# Patient Record
Sex: Male | Born: 1950 | Race: White | Hispanic: No | Marital: Married | Smoking: Former smoker
Health system: Southern US, Community
[De-identification: ages and names within clinical notes are randomized; demographics above are authoritative.]

## PROBLEM LIST (undated history)

## (undated) DIAGNOSIS — Z87891 Personal history of nicotine dependence: Secondary | ICD-10-CM

## (undated) DIAGNOSIS — R945 Abnormal results of liver function studies: Secondary | ICD-10-CM

## (undated) DIAGNOSIS — K219 Gastro-esophageal reflux disease without esophagitis: Secondary | ICD-10-CM

## (undated) DIAGNOSIS — J449 Chronic obstructive pulmonary disease, unspecified: Secondary | ICD-10-CM

## (undated) DIAGNOSIS — G5620 Lesion of ulnar nerve, unspecified upper limb: Secondary | ICD-10-CM

## (undated) DIAGNOSIS — I4891 Unspecified atrial fibrillation: Secondary | ICD-10-CM

## (undated) DIAGNOSIS — L02212 Cutaneous abscess of back [any part, except buttock]: Secondary | ICD-10-CM

## (undated) DIAGNOSIS — F528 Other sexual dysfunction not due to a substance or known physiological condition: Secondary | ICD-10-CM

## (undated) DIAGNOSIS — R7309 Other abnormal glucose: Secondary | ICD-10-CM

## (undated) DIAGNOSIS — R0600 Dyspnea, unspecified: Secondary | ICD-10-CM

## (undated) DIAGNOSIS — E119 Type 2 diabetes mellitus without complications: Secondary | ICD-10-CM

## (undated) DIAGNOSIS — M7710 Lateral epicondylitis, unspecified elbow: Secondary | ICD-10-CM

## (undated) DIAGNOSIS — I10 Essential (primary) hypertension: Secondary | ICD-10-CM

## (undated) DIAGNOSIS — R200 Anesthesia of skin: Secondary | ICD-10-CM

## (undated) DIAGNOSIS — E785 Hyperlipidemia, unspecified: Secondary | ICD-10-CM

## (undated) DIAGNOSIS — I219 Acute myocardial infarction, unspecified: Secondary | ICD-10-CM

## (undated) DIAGNOSIS — I2510 Atherosclerotic heart disease of native coronary artery without angina pectoris: Secondary | ICD-10-CM

## (undated) DIAGNOSIS — T881XXA Other complications following immunization, not elsewhere classified, initial encounter: Secondary | ICD-10-CM

## (undated) DIAGNOSIS — E669 Obesity, unspecified: Secondary | ICD-10-CM

## (undated) DIAGNOSIS — H35369 Drusen (degenerative) of macula, unspecified eye: Secondary | ICD-10-CM

## (undated) DIAGNOSIS — I499 Cardiac arrhythmia, unspecified: Secondary | ICD-10-CM

## (undated) DIAGNOSIS — F419 Anxiety disorder, unspecified: Secondary | ICD-10-CM

## (undated) DIAGNOSIS — K37 Unspecified appendicitis: Secondary | ICD-10-CM

## (undated) DIAGNOSIS — K3532 Acute appendicitis with perforation and localized peritonitis, without abscess: Secondary | ICD-10-CM

## (undated) DIAGNOSIS — J0510 Acute epiglottitis without obstruction: Secondary | ICD-10-CM

## (undated) DIAGNOSIS — G4733 Obstructive sleep apnea (adult) (pediatric): Secondary | ICD-10-CM

## (undated) DIAGNOSIS — R011 Cardiac murmur, unspecified: Secondary | ICD-10-CM

## (undated) DIAGNOSIS — K429 Umbilical hernia without obstruction or gangrene: Secondary | ICD-10-CM

## (undated) DIAGNOSIS — D696 Thrombocytopenia, unspecified: Secondary | ICD-10-CM

## (undated) DIAGNOSIS — N281 Cyst of kidney, acquired: Secondary | ICD-10-CM

## (undated) DIAGNOSIS — I723 Aneurysm of iliac artery: Secondary | ICD-10-CM

## (undated) HISTORY — DX: Aneurysm of iliac artery: I72.3

## (undated) HISTORY — DX: Lateral epicondylitis, unspecified elbow: M77.10

## (undated) HISTORY — DX: Other complications following immunization, not elsewhere classified, initial encounter: T88.1XXA

## (undated) HISTORY — DX: Cyst of kidney, acquired: N28.1

## (undated) HISTORY — PX: LUMBAR DISC SURGERY: SHX700

## (undated) HISTORY — DX: Acute epiglottitis without obstruction: J05.10

## (undated) HISTORY — DX: Unspecified appendicitis: K37

## (undated) HISTORY — DX: Obstructive sleep apnea (adult) (pediatric): G47.33

## (undated) HISTORY — DX: Hyperlipidemia, unspecified: E78.5

## (undated) HISTORY — DX: Other sexual dysfunction not due to a substance or known physiological condition: F52.8

## (undated) HISTORY — DX: Abnormal results of liver function studies: R94.5

## (undated) HISTORY — DX: Gastro-esophageal reflux disease without esophagitis: K21.9

## (undated) HISTORY — PX: COLON SURGERY: SHX602

## (undated) HISTORY — DX: Obesity, unspecified: E66.9

## (undated) HISTORY — DX: Acute appendicitis with perforation, localized peritonitis, and gangrene, without abscess: K35.32

## (undated) HISTORY — DX: Personal history of nicotine dependence: Z87.891

## (undated) HISTORY — DX: Other abnormal glucose: R73.09

## (undated) HISTORY — PX: OTHER SURGICAL HISTORY: SHX169

## (undated) HISTORY — DX: Lesion of ulnar nerve, unspecified upper limb: G56.20

## (undated) HISTORY — DX: Atherosclerotic heart disease of native coronary artery without angina pectoris: I25.10

## (undated) HISTORY — DX: Anesthesia of skin: R20.0

## (undated) HISTORY — DX: Cutaneous abscess of back (any part, except buttock): L02.212

## (undated) HISTORY — DX: Unspecified atrial fibrillation: I48.91

## (undated) HISTORY — DX: Thrombocytopenia, unspecified: D69.6

## (undated) HISTORY — DX: Drusen (degenerative) of macula, unspecified eye: H35.369

## (undated) HISTORY — PX: UMBILICAL HERNIA REPAIR: SHX196

## (undated) HISTORY — PX: CARDIAC VALVE REPLACEMENT: SHX585

## (undated) HISTORY — DX: Umbilical hernia without obstruction or gangrene: K42.9

---

## 1996-12-30 HISTORY — PX: OTHER SURGICAL HISTORY: SHX169

## 2000-02-12 ENCOUNTER — Encounter: Admission: RE | Admit: 2000-02-12 | Discharge: 2000-02-12 | Payer: Self-pay | Admitting: Family Medicine

## 2000-02-12 ENCOUNTER — Encounter: Payer: Self-pay | Admitting: Family Medicine

## 2002-08-30 HISTORY — PX: OTHER SURGICAL HISTORY: SHX169

## 2002-11-29 DIAGNOSIS — I2510 Atherosclerotic heart disease of native coronary artery without angina pectoris: Secondary | ICD-10-CM

## 2002-11-29 HISTORY — DX: Atherosclerotic heart disease of native coronary artery without angina pectoris: I25.10

## 2002-12-07 ENCOUNTER — Inpatient Hospital Stay (HOSPITAL_COMMUNITY): Admission: EM | Admit: 2002-12-07 | Discharge: 2002-12-09 | Payer: Self-pay | Admitting: *Deleted

## 2002-12-07 ENCOUNTER — Encounter: Payer: Self-pay | Admitting: *Deleted

## 2002-12-26 ENCOUNTER — Ambulatory Visit (HOSPITAL_COMMUNITY): Admission: RE | Admit: 2002-12-26 | Discharge: 2002-12-26 | Payer: Self-pay | Admitting: Internal Medicine

## 2002-12-26 ENCOUNTER — Encounter: Payer: Self-pay | Admitting: Pulmonary Disease

## 2003-01-03 ENCOUNTER — Encounter (HOSPITAL_COMMUNITY): Admission: RE | Admit: 2003-01-03 | Discharge: 2003-01-14 | Payer: Self-pay | Admitting: Cardiology

## 2003-01-17 ENCOUNTER — Encounter (HOSPITAL_COMMUNITY): Admission: RE | Admit: 2003-01-17 | Discharge: 2003-04-17 | Payer: Self-pay | Admitting: Cardiology

## 2003-09-20 ENCOUNTER — Encounter: Payer: Self-pay | Admitting: Otolaryngology

## 2003-09-22 ENCOUNTER — Encounter: Payer: Self-pay | Admitting: Otolaryngology

## 2003-09-22 ENCOUNTER — Ambulatory Visit (HOSPITAL_COMMUNITY): Admission: RE | Admit: 2003-09-22 | Discharge: 2003-09-23 | Payer: Self-pay | Admitting: Otolaryngology

## 2003-09-22 ENCOUNTER — Encounter (INDEPENDENT_AMBULATORY_CARE_PROVIDER_SITE_OTHER): Payer: Self-pay | Admitting: *Deleted

## 2004-02-27 ENCOUNTER — Observation Stay (HOSPITAL_COMMUNITY): Admission: EM | Admit: 2004-02-27 | Discharge: 2004-02-28 | Payer: Self-pay | Admitting: Emergency Medicine

## 2004-04-27 ENCOUNTER — Encounter: Payer: Self-pay | Admitting: Pulmonary Disease

## 2004-11-04 ENCOUNTER — Inpatient Hospital Stay (HOSPITAL_COMMUNITY): Admission: EM | Admit: 2004-11-04 | Discharge: 2004-11-06 | Payer: Self-pay | Admitting: Emergency Medicine

## 2004-11-04 ENCOUNTER — Ambulatory Visit: Payer: Self-pay | Admitting: Cardiovascular Disease

## 2004-11-04 ENCOUNTER — Ambulatory Visit: Payer: Self-pay | Admitting: Cardiology

## 2004-11-06 ENCOUNTER — Encounter: Payer: Self-pay | Admitting: Cardiology

## 2004-11-09 ENCOUNTER — Ambulatory Visit: Payer: Self-pay | Admitting: Cardiology

## 2004-11-14 ENCOUNTER — Ambulatory Visit: Payer: Self-pay | Admitting: Internal Medicine

## 2004-11-20 ENCOUNTER — Ambulatory Visit: Payer: Self-pay | Admitting: Cardiology

## 2004-11-23 ENCOUNTER — Ambulatory Visit: Payer: Self-pay | Admitting: Internal Medicine

## 2004-11-30 ENCOUNTER — Ambulatory Visit: Payer: Self-pay | Admitting: Cardiology

## 2004-12-10 ENCOUNTER — Ambulatory Visit: Payer: Self-pay | Admitting: Cardiology

## 2005-01-02 ENCOUNTER — Ambulatory Visit: Payer: Self-pay

## 2005-01-30 ENCOUNTER — Ambulatory Visit: Payer: Self-pay | Admitting: Cardiology

## 2005-02-20 ENCOUNTER — Ambulatory Visit: Payer: Self-pay | Admitting: Cardiology

## 2005-03-13 ENCOUNTER — Ambulatory Visit: Payer: Self-pay | Admitting: Cardiology

## 2005-04-10 ENCOUNTER — Ambulatory Visit: Payer: Self-pay | Admitting: Internal Medicine

## 2005-04-24 ENCOUNTER — Ambulatory Visit: Payer: Self-pay | Admitting: Cardiology

## 2005-05-22 ENCOUNTER — Ambulatory Visit: Payer: Self-pay | Admitting: *Deleted

## 2005-06-19 ENCOUNTER — Ambulatory Visit: Payer: Self-pay | Admitting: *Deleted

## 2005-07-11 ENCOUNTER — Ambulatory Visit: Payer: Self-pay | Admitting: Cardiology

## 2005-08-08 ENCOUNTER — Ambulatory Visit: Payer: Self-pay | Admitting: Internal Medicine

## 2005-08-22 ENCOUNTER — Ambulatory Visit: Payer: Self-pay | Admitting: Cardiology

## 2005-09-19 ENCOUNTER — Ambulatory Visit: Payer: Self-pay | Admitting: Cardiology

## 2005-10-17 ENCOUNTER — Ambulatory Visit: Payer: Self-pay | Admitting: Cardiology

## 2005-11-11 ENCOUNTER — Ambulatory Visit: Payer: Self-pay | Admitting: Cardiology

## 2005-12-09 ENCOUNTER — Ambulatory Visit: Payer: Self-pay | Admitting: Cardiology

## 2006-01-06 ENCOUNTER — Ambulatory Visit: Payer: Self-pay | Admitting: Cardiology

## 2006-01-27 ENCOUNTER — Ambulatory Visit: Payer: Self-pay | Admitting: Cardiology

## 2006-02-10 ENCOUNTER — Ambulatory Visit: Payer: Self-pay | Admitting: Internal Medicine

## 2006-03-10 ENCOUNTER — Ambulatory Visit: Payer: Self-pay | Admitting: Cardiology

## 2006-03-31 ENCOUNTER — Ambulatory Visit: Payer: Self-pay | Admitting: Cardiology

## 2006-04-28 ENCOUNTER — Ambulatory Visit: Payer: Self-pay | Admitting: Cardiology

## 2006-05-27 ENCOUNTER — Ambulatory Visit: Payer: Self-pay | Admitting: Internal Medicine

## 2006-06-23 ENCOUNTER — Ambulatory Visit: Payer: Self-pay | Admitting: Cardiology

## 2006-07-12 ENCOUNTER — Inpatient Hospital Stay (HOSPITAL_COMMUNITY): Admission: EM | Admit: 2006-07-12 | Discharge: 2006-07-13 | Payer: Self-pay | Admitting: Emergency Medicine

## 2006-07-12 ENCOUNTER — Ambulatory Visit: Payer: Self-pay | Admitting: Cardiology

## 2006-07-28 ENCOUNTER — Ambulatory Visit: Payer: Self-pay | Admitting: Cardiology

## 2006-08-25 ENCOUNTER — Ambulatory Visit: Payer: Self-pay | Admitting: Cardiology

## 2006-09-22 ENCOUNTER — Ambulatory Visit: Payer: Self-pay | Admitting: Cardiology

## 2006-10-15 ENCOUNTER — Ambulatory Visit: Payer: Self-pay | Admitting: Cardiology

## 2006-10-20 ENCOUNTER — Ambulatory Visit: Payer: Self-pay | Admitting: Cardiovascular Disease

## 2006-11-10 ENCOUNTER — Ambulatory Visit: Payer: Self-pay | Admitting: Cardiovascular Disease

## 2006-11-24 ENCOUNTER — Ambulatory Visit: Payer: Self-pay | Admitting: Cardiology

## 2006-12-24 ENCOUNTER — Ambulatory Visit: Payer: Self-pay | Admitting: Cardiology

## 2007-01-21 ENCOUNTER — Ambulatory Visit: Payer: Self-pay | Admitting: Cardiology

## 2007-02-18 ENCOUNTER — Ambulatory Visit: Payer: Self-pay | Admitting: Cardiovascular Disease

## 2007-03-18 ENCOUNTER — Ambulatory Visit: Payer: Self-pay | Admitting: Cardiovascular Disease

## 2007-04-15 ENCOUNTER — Ambulatory Visit: Payer: Self-pay | Admitting: *Deleted

## 2007-05-13 ENCOUNTER — Ambulatory Visit: Payer: Self-pay | Admitting: *Deleted

## 2007-06-01 ENCOUNTER — Encounter: Payer: Self-pay | Admitting: Internal Medicine

## 2007-06-15 ENCOUNTER — Ambulatory Visit: Payer: Self-pay | Admitting: Cardiology

## 2007-07-02 ENCOUNTER — Ambulatory Visit: Payer: Self-pay | Admitting: Cardiology

## 2007-07-13 ENCOUNTER — Ambulatory Visit: Payer: Self-pay | Admitting: Cardiovascular Disease

## 2007-07-29 ENCOUNTER — Encounter: Payer: Self-pay | Admitting: Cardiology

## 2007-07-29 ENCOUNTER — Ambulatory Visit: Payer: Self-pay

## 2007-08-10 ENCOUNTER — Ambulatory Visit: Payer: Self-pay | Admitting: Internal Medicine

## 2007-09-07 ENCOUNTER — Ambulatory Visit: Payer: Self-pay | Admitting: Cardiology

## 2007-09-28 ENCOUNTER — Ambulatory Visit: Payer: Self-pay | Admitting: Cardiovascular Disease

## 2007-10-26 ENCOUNTER — Ambulatory Visit: Payer: Self-pay | Admitting: Cardiology

## 2007-11-23 ENCOUNTER — Ambulatory Visit: Payer: Self-pay | Admitting: Cardiology

## 2007-12-07 ENCOUNTER — Ambulatory Visit: Payer: Self-pay | Admitting: Internal Medicine

## 2007-12-28 ENCOUNTER — Ambulatory Visit: Payer: Self-pay | Admitting: Cardiology

## 2008-01-25 ENCOUNTER — Ambulatory Visit: Payer: Self-pay | Admitting: Cardiology

## 2008-01-26 ENCOUNTER — Ambulatory Visit: Payer: Self-pay | Admitting: Cardiovascular Disease

## 2008-01-29 ENCOUNTER — Emergency Department (HOSPITAL_COMMUNITY): Admission: EM | Admit: 2008-01-29 | Discharge: 2008-01-29 | Payer: Self-pay | Admitting: Emergency Medicine

## 2008-02-03 ENCOUNTER — Ambulatory Visit: Payer: Self-pay | Admitting: Cardiology

## 2008-02-22 ENCOUNTER — Ambulatory Visit: Payer: Self-pay | Admitting: Cardiology

## 2008-03-03 ENCOUNTER — Ambulatory Visit: Payer: Self-pay | Admitting: Cardiovascular Disease

## 2008-03-31 ENCOUNTER — Ambulatory Visit: Payer: Self-pay | Admitting: Internal Medicine

## 2008-04-14 ENCOUNTER — Ambulatory Visit: Payer: Self-pay | Admitting: Cardiology

## 2008-04-28 ENCOUNTER — Ambulatory Visit: Payer: Self-pay | Admitting: Cardiology

## 2008-05-13 ENCOUNTER — Ambulatory Visit: Payer: Self-pay | Admitting: Cardiology

## 2008-05-26 ENCOUNTER — Ambulatory Visit: Payer: Self-pay | Admitting: Cardiology

## 2008-06-16 ENCOUNTER — Ambulatory Visit: Payer: Self-pay | Admitting: Cardiology

## 2008-07-14 ENCOUNTER — Ambulatory Visit: Payer: Self-pay | Admitting: Cardiovascular Disease

## 2008-08-11 ENCOUNTER — Ambulatory Visit: Payer: Self-pay | Admitting: Internal Medicine

## 2008-09-08 ENCOUNTER — Ambulatory Visit: Payer: Self-pay | Admitting: Cardiology

## 2008-09-29 ENCOUNTER — Ambulatory Visit: Payer: Self-pay | Admitting: Internal Medicine

## 2008-10-17 ENCOUNTER — Ambulatory Visit: Payer: Self-pay | Admitting: Internal Medicine

## 2008-11-02 ENCOUNTER — Ambulatory Visit: Payer: Self-pay | Admitting: Cardiology

## 2008-11-07 ENCOUNTER — Ambulatory Visit: Payer: Self-pay | Admitting: Cardiology

## 2008-11-21 ENCOUNTER — Ambulatory Visit: Payer: Self-pay | Admitting: Internal Medicine

## 2008-12-12 ENCOUNTER — Ambulatory Visit: Payer: Self-pay | Admitting: Cardiology

## 2009-01-02 ENCOUNTER — Ambulatory Visit: Payer: Self-pay | Admitting: Internal Medicine

## 2009-01-23 ENCOUNTER — Encounter: Payer: Self-pay | Admitting: Internal Medicine

## 2009-01-31 ENCOUNTER — Ambulatory Visit: Payer: Self-pay | Admitting: Cardiology

## 2009-02-28 ENCOUNTER — Ambulatory Visit: Payer: Self-pay | Admitting: Internal Medicine

## 2009-03-21 ENCOUNTER — Ambulatory Visit: Payer: Self-pay | Admitting: Cardiovascular Disease

## 2009-03-22 ENCOUNTER — Ambulatory Visit: Payer: Self-pay | Admitting: Internal Medicine

## 2009-03-22 DIAGNOSIS — G4733 Obstructive sleep apnea (adult) (pediatric): Secondary | ICD-10-CM

## 2009-03-22 DIAGNOSIS — F528 Other sexual dysfunction not due to a substance or known physiological condition: Secondary | ICD-10-CM | POA: Insufficient documentation

## 2009-03-22 DIAGNOSIS — E785 Hyperlipidemia, unspecified: Secondary | ICD-10-CM | POA: Insufficient documentation

## 2009-03-22 DIAGNOSIS — G5620 Lesion of ulnar nerve, unspecified upper limb: Secondary | ICD-10-CM | POA: Insufficient documentation

## 2009-03-22 DIAGNOSIS — K219 Gastro-esophageal reflux disease without esophagitis: Secondary | ICD-10-CM

## 2009-03-22 DIAGNOSIS — I4891 Unspecified atrial fibrillation: Secondary | ICD-10-CM

## 2009-03-22 DIAGNOSIS — I2510 Atherosclerotic heart disease of native coronary artery without angina pectoris: Secondary | ICD-10-CM | POA: Insufficient documentation

## 2009-03-22 DIAGNOSIS — I252 Old myocardial infarction: Secondary | ICD-10-CM | POA: Insufficient documentation

## 2009-03-22 HISTORY — DX: Obstructive sleep apnea (adult) (pediatric): G47.33

## 2009-03-22 HISTORY — DX: Hyperlipidemia, unspecified: E78.5

## 2009-03-22 HISTORY — DX: Gastro-esophageal reflux disease without esophagitis: K21.9

## 2009-03-22 HISTORY — DX: Unspecified atrial fibrillation: I48.91

## 2009-03-22 HISTORY — DX: Other sexual dysfunction not due to a substance or known physiological condition: F52.8

## 2009-03-22 HISTORY — DX: Lesion of ulnar nerve, unspecified upper limb: G56.20

## 2009-03-22 LAB — CONVERTED CEMR LAB
Bilirubin Urine: NEGATIVE
Blood in Urine, dipstick: NEGATIVE
Glucose, Urine, Semiquant: NEGATIVE
Ketones, urine, test strip: NEGATIVE
Nitrite: NEGATIVE
Specific Gravity, Urine: 1.015
Urobilinogen, UA: 0.2
WBC Urine, dipstick: NEGATIVE
pH: 7.5

## 2009-04-03 LAB — CONVERTED CEMR LAB
ALT: 40 units/L (ref 0–53)
AST: 27 units/L (ref 0–37)
Albumin: 4.1 g/dL (ref 3.5–5.2)
Alkaline Phosphatase: 72 units/L (ref 39–117)
BUN: 10 mg/dL (ref 6–23)
Basophils Absolute: 0 10*3/uL (ref 0.0–0.1)
Basophils Relative: 0.3 % (ref 0.0–3.0)
Bilirubin, Direct: 0.1 mg/dL (ref 0.0–0.3)
CO2: 32 meq/L (ref 19–32)
Calcium: 9.4 mg/dL (ref 8.4–10.5)
Chloride: 105 meq/L (ref 96–112)
Cholesterol: 97 mg/dL (ref 0–200)
Creatinine, Ser: 0.8 mg/dL (ref 0.4–1.5)
Eosinophils Absolute: 0.6 10*3/uL (ref 0.0–0.7)
Eosinophils Relative: 7.5 % — ABNORMAL HIGH (ref 0.0–5.0)
GFR calc non Af Amer: 105.03 mL/min (ref >60)
Glucose, Bld: 80 mg/dL (ref 70–99)
HCT: 47.3 % (ref 39.0–52.0)
HDL: 30.9 mg/dL — ABNORMAL LOW (ref >39.00)
Hemoglobin: 16.6 g/dL (ref 13.0–17.0)
LDL Cholesterol: 48 mg/dL (ref 0–99)
Lymphocytes Relative: 25.1 % (ref 12.0–46.0)
Lymphs Abs: 2.2 10*3/uL (ref 0.7–4.0)
MCHC: 35 g/dL (ref 30.0–36.0)
MCV: 91.3 fL (ref 78.0–100.0)
Monocytes Absolute: 0.6 10*3/uL (ref 0.1–1.0)
Monocytes Relative: 6.9 % (ref 3.0–12.0)
Neutro Abs: 5.2 10*3/uL (ref 1.4–7.7)
Neutrophils Relative %: 60.2 % (ref 43.0–77.0)
PSA: 0.88 ng/mL (ref 0.10–4.00)
Platelets: 123 10*3/uL — ABNORMAL LOW (ref 150.0–400.0)
Potassium: 3.7 meq/L (ref 3.5–5.1)
RBC: 5.18 M/uL (ref 4.22–5.81)
RDW: 13.4 % (ref 11.5–14.6)
Sodium: 143 meq/L (ref 135–145)
TSH: 1.76 microintl units/mL (ref 0.35–5.50)
Total Bilirubin: 1.5 mg/dL — ABNORMAL HIGH (ref 0.3–1.2)
Total CHOL/HDL Ratio: 3
Total Protein: 7.4 g/dL (ref 6.0–8.3)
Triglycerides: 91 mg/dL (ref 0.0–149.0)
VLDL: 18.2 mg/dL (ref 0.0–40.0)
WBC: 8.6 10*3/uL (ref 4.5–10.5)

## 2009-04-17 ENCOUNTER — Encounter: Payer: Self-pay | Admitting: Internal Medicine

## 2009-04-18 ENCOUNTER — Ambulatory Visit: Payer: Self-pay | Admitting: Cardiology

## 2009-04-24 ENCOUNTER — Ambulatory Visit: Payer: Self-pay | Admitting: Internal Medicine

## 2009-04-24 DIAGNOSIS — E669 Obesity, unspecified: Secondary | ICD-10-CM | POA: Insufficient documentation

## 2009-04-24 HISTORY — DX: Obesity, unspecified: E66.9

## 2009-04-25 ENCOUNTER — Ambulatory Visit: Payer: Self-pay | Admitting: Internal Medicine

## 2009-04-26 ENCOUNTER — Telehealth: Payer: Self-pay | Admitting: Cardiology

## 2009-04-26 ENCOUNTER — Encounter: Payer: Self-pay | Admitting: *Deleted

## 2009-04-26 LAB — CONVERTED CEMR LAB
OCCULT 1: NEGATIVE
OCCULT 2: NEGATIVE
OCCULT 3: NEGATIVE

## 2009-05-04 ENCOUNTER — Telehealth (INDEPENDENT_AMBULATORY_CARE_PROVIDER_SITE_OTHER): Payer: Self-pay | Admitting: *Deleted

## 2009-05-08 ENCOUNTER — Ambulatory Visit: Payer: Self-pay

## 2009-05-08 ENCOUNTER — Encounter: Payer: Self-pay | Admitting: Cardiology

## 2009-05-11 ENCOUNTER — Telehealth (INDEPENDENT_AMBULATORY_CARE_PROVIDER_SITE_OTHER): Payer: Self-pay | Admitting: *Deleted

## 2009-05-16 ENCOUNTER — Encounter (INDEPENDENT_AMBULATORY_CARE_PROVIDER_SITE_OTHER): Payer: Self-pay | Admitting: *Deleted

## 2009-05-16 ENCOUNTER — Ambulatory Visit: Payer: Self-pay | Admitting: Internal Medicine

## 2009-05-31 ENCOUNTER — Encounter: Payer: Self-pay | Admitting: *Deleted

## 2009-06-13 ENCOUNTER — Encounter (INDEPENDENT_AMBULATORY_CARE_PROVIDER_SITE_OTHER): Payer: Self-pay | Admitting: Cardiology

## 2009-06-13 ENCOUNTER — Ambulatory Visit: Payer: Self-pay | Admitting: Internal Medicine

## 2009-06-13 LAB — CONVERTED CEMR LAB
POC INR: 2.9
Protime: 20.7

## 2009-07-05 ENCOUNTER — Encounter: Payer: Self-pay | Admitting: *Deleted

## 2009-07-11 ENCOUNTER — Ambulatory Visit: Payer: Self-pay | Admitting: Cardiology

## 2009-07-11 ENCOUNTER — Encounter (INDEPENDENT_AMBULATORY_CARE_PROVIDER_SITE_OTHER): Payer: Self-pay | Admitting: Cardiology

## 2009-07-11 LAB — CONVERTED CEMR LAB
POC INR: 2.1
Prothrombin Time: 17.8 s

## 2009-07-24 ENCOUNTER — Ambulatory Visit (HOSPITAL_COMMUNITY): Admission: RE | Admit: 2009-07-24 | Discharge: 2009-07-26 | Payer: Self-pay | Admitting: General Surgery

## 2009-08-08 ENCOUNTER — Encounter: Payer: Self-pay | Admitting: Internal Medicine

## 2009-08-22 ENCOUNTER — Ambulatory Visit: Payer: Self-pay | Admitting: Internal Medicine

## 2009-08-22 LAB — CONVERTED CEMR LAB: POC INR: 1.7

## 2009-08-25 ENCOUNTER — Encounter (INDEPENDENT_AMBULATORY_CARE_PROVIDER_SITE_OTHER): Payer: Self-pay | Admitting: *Deleted

## 2009-09-11 ENCOUNTER — Encounter: Payer: Self-pay | Admitting: Internal Medicine

## 2009-09-12 ENCOUNTER — Ambulatory Visit: Payer: Self-pay | Admitting: Cardiology

## 2009-09-12 LAB — CONVERTED CEMR LAB: POC INR: 2.2

## 2009-10-10 ENCOUNTER — Ambulatory Visit: Payer: Self-pay | Admitting: Cardiology

## 2009-10-10 LAB — CONVERTED CEMR LAB: POC INR: 1.9

## 2009-10-24 ENCOUNTER — Ambulatory Visit: Payer: Self-pay | Admitting: Internal Medicine

## 2009-10-24 DIAGNOSIS — Z87891 Personal history of nicotine dependence: Secondary | ICD-10-CM

## 2009-10-24 DIAGNOSIS — M7710 Lateral epicondylitis, unspecified elbow: Secondary | ICD-10-CM

## 2009-10-24 HISTORY — DX: Personal history of nicotine dependence: Z87.891

## 2009-10-24 HISTORY — DX: Lateral epicondylitis, unspecified elbow: M77.10

## 2009-11-07 ENCOUNTER — Ambulatory Visit: Payer: Self-pay | Admitting: Cardiology

## 2009-11-07 ENCOUNTER — Encounter: Payer: Self-pay | Admitting: Cardiology

## 2009-11-07 LAB — CONVERTED CEMR LAB: POC INR: 2.2

## 2009-11-13 ENCOUNTER — Encounter: Payer: Self-pay | Admitting: Cardiology

## 2009-11-13 ENCOUNTER — Ambulatory Visit: Payer: Self-pay | Admitting: Cardiology

## 2009-11-15 ENCOUNTER — Telehealth: Payer: Self-pay | Admitting: *Deleted

## 2009-11-15 LAB — CONVERTED CEMR LAB
ALT: 48 units/L (ref 0–53)
AST: 26 units/L (ref 0–37)
Albumin: 4.2 g/dL (ref 3.5–5.2)
Alkaline Phosphatase: 57 units/L (ref 39–117)
Bilirubin, Direct: 0.1 mg/dL (ref 0.0–0.3)
Cholesterol: 129 mg/dL (ref 0–200)
HDL: 36.2 mg/dL — ABNORMAL LOW (ref >39.00)
LDL Cholesterol: 54 mg/dL (ref 0–99)
Total Bilirubin: 1.4 mg/dL — ABNORMAL HIGH (ref 0.3–1.2)
Total CHOL/HDL Ratio: 4
Total Protein: 7 g/dL (ref 6.0–8.3)
Triglycerides: 194 mg/dL — ABNORMAL HIGH (ref 0.0–149.0)
VLDL: 38.8 mg/dL (ref 0.0–40.0)

## 2009-11-17 ENCOUNTER — Telehealth: Payer: Self-pay | Admitting: *Deleted

## 2010-04-18 ENCOUNTER — Ambulatory Visit: Payer: Self-pay | Admitting: Internal Medicine

## 2010-04-18 LAB — CONVERTED CEMR LAB
ALT: 62 units/L — ABNORMAL HIGH (ref 0–53)
AST: 33 units/L (ref 0–37)
Albumin: 4.1 g/dL (ref 3.5–5.2)
Alkaline Phosphatase: 61 units/L (ref 39–117)
BUN: 18 mg/dL (ref 6–23)
Basophils Absolute: 0 10*3/uL (ref 0.0–0.1)
Basophils Relative: 0.3 % (ref 0.0–3.0)
Bilirubin Urine: NEGATIVE
Bilirubin, Direct: 0.2 mg/dL (ref 0.0–0.3)
Blood in Urine, dipstick: NEGATIVE
CO2: 29 meq/L (ref 19–32)
Calcium: 9.1 mg/dL (ref 8.4–10.5)
Chloride: 106 meq/L (ref 96–112)
Cholesterol: 105 mg/dL (ref 0–200)
Creatinine, Ser: 0.8 mg/dL (ref 0.4–1.5)
Eosinophils Absolute: 0.7 10*3/uL (ref 0.0–0.7)
Eosinophils Relative: 9.3 % — ABNORMAL HIGH (ref 0.0–5.0)
GFR calc non Af Amer: 104.65 mL/min (ref >60)
Glucose, Bld: 112 mg/dL — ABNORMAL HIGH (ref 70–99)
Glucose, Urine, Semiquant: NEGATIVE
HCT: 44.6 % (ref 39.0–52.0)
HDL: 32.5 mg/dL — ABNORMAL LOW (ref >39.00)
Hemoglobin: 15.6 g/dL (ref 13.0–17.0)
Ketones, urine, test strip: NEGATIVE
LDL Cholesterol: 52 mg/dL (ref 0–99)
Lymphocytes Relative: 30.4 % (ref 12.0–46.0)
Lymphs Abs: 2.4 10*3/uL (ref 0.7–4.0)
MCHC: 35 g/dL (ref 30.0–36.0)
MCV: 91.4 fL (ref 78.0–100.0)
Monocytes Absolute: 0.6 10*3/uL (ref 0.1–1.0)
Monocytes Relative: 7.6 % (ref 3.0–12.0)
Neutro Abs: 4.1 10*3/uL (ref 1.4–7.7)
Neutrophils Relative %: 52.4 % (ref 43.0–77.0)
Nitrite: NEGATIVE
PSA: 0.61 ng/mL (ref 0.10–4.00)
Platelets: 140 10*3/uL — ABNORMAL LOW (ref 150.0–400.0)
Potassium: 4 meq/L (ref 3.5–5.1)
Protein, U semiquant: NEGATIVE
RBC: 4.88 M/uL (ref 4.22–5.81)
RDW: 13.1 % (ref 11.5–14.6)
Sodium: 142 meq/L (ref 135–145)
Specific Gravity, Urine: 1.025
TSH: 1.54 microintl units/mL (ref 0.35–5.50)
Total Bilirubin: 1.1 mg/dL (ref 0.3–1.2)
Total CHOL/HDL Ratio: 3
Total Protein: 7.3 g/dL (ref 6.0–8.3)
Triglycerides: 102 mg/dL (ref 0.0–149.0)
Urobilinogen, UA: 0.2
VLDL: 20.4 mg/dL (ref 0.0–40.0)
WBC Urine, dipstick: NEGATIVE
WBC: 7.9 10*3/uL (ref 4.5–10.5)
pH: 5.5

## 2010-04-25 ENCOUNTER — Ambulatory Visit: Payer: Self-pay | Admitting: Internal Medicine

## 2010-04-25 DIAGNOSIS — R945 Abnormal results of liver function studies: Secondary | ICD-10-CM | POA: Insufficient documentation

## 2010-04-25 DIAGNOSIS — R7309 Other abnormal glucose: Secondary | ICD-10-CM

## 2010-04-25 HISTORY — DX: Abnormal results of liver function studies: R94.5

## 2010-04-25 HISTORY — DX: Other abnormal glucose: R73.09

## 2010-05-15 ENCOUNTER — Encounter: Payer: Self-pay | Admitting: Internal Medicine

## 2010-07-21 ENCOUNTER — Observation Stay (HOSPITAL_COMMUNITY): Admission: EM | Admit: 2010-07-21 | Discharge: 2010-07-23 | Payer: Self-pay | Admitting: Emergency Medicine

## 2010-07-21 ENCOUNTER — Ambulatory Visit: Payer: Self-pay | Admitting: Cardiology

## 2010-07-22 ENCOUNTER — Encounter: Payer: Self-pay | Admitting: Cardiology

## 2010-08-08 ENCOUNTER — Ambulatory Visit: Payer: Self-pay | Admitting: Internal Medicine

## 2010-08-08 LAB — CONVERTED CEMR LAB
ALT: 62 units/L — ABNORMAL HIGH (ref 0–53)
AST: 31 units/L (ref 0–37)
Albumin: 4.1 g/dL (ref 3.5–5.2)
Alkaline Phosphatase: 63 units/L (ref 39–117)
Bilirubin, Direct: 0.2 mg/dL (ref 0.0–0.3)
Hgb A1c MFr Bld: 6.2 % (ref 4.6–6.5)
Total Bilirubin: 1 mg/dL (ref 0.3–1.2)
Total Protein: 6.8 g/dL (ref 6.0–8.3)

## 2010-08-13 ENCOUNTER — Encounter: Payer: Self-pay | Admitting: Internal Medicine

## 2010-08-16 ENCOUNTER — Ambulatory Visit: Payer: Self-pay | Admitting: Internal Medicine

## 2010-08-16 DIAGNOSIS — D696 Thrombocytopenia, unspecified: Secondary | ICD-10-CM

## 2010-08-16 DIAGNOSIS — R002 Palpitations: Secondary | ICD-10-CM | POA: Insufficient documentation

## 2010-08-16 HISTORY — DX: Thrombocytopenia, unspecified: D69.6

## 2010-08-21 ENCOUNTER — Ambulatory Visit: Payer: Self-pay | Admitting: Hematology and Oncology

## 2010-08-22 ENCOUNTER — Encounter: Payer: Self-pay | Admitting: Internal Medicine

## 2010-08-22 LAB — MORPHOLOGY
PLT EST: DECREASED
RBC Comments: NORMAL

## 2010-08-22 LAB — CBC WITH DIFFERENTIAL/PLATELET
BASO%: 0.4 % (ref 0.0–2.0)
Basophils Absolute: 0 10*3/uL (ref 0.0–0.1)
EOS%: 7.9 % — ABNORMAL HIGH (ref 0.0–7.0)
Eosinophils Absolute: 0.7 10*3/uL — ABNORMAL HIGH (ref 0.0–0.5)
HCT: 45.5 % (ref 38.4–49.9)
HGB: 16 g/dL (ref 13.0–17.1)
LYMPH%: 31.7 % (ref 14.0–49.0)
MCH: 31.7 pg (ref 27.2–33.4)
MCHC: 35.2 g/dL (ref 32.0–36.0)
MCV: 89.9 fL (ref 79.3–98.0)
MONO#: 0.6 10*3/uL (ref 0.1–0.9)
MONO%: 7.3 % (ref 0.0–14.0)
NEUT#: 4.4 10*3/uL (ref 1.5–6.5)
NEUT%: 52.7 % (ref 39.0–75.0)
Platelets: 141 10*3/uL (ref 140–400)
RBC: 5.06 10*6/uL (ref 4.20–5.82)
RDW: 12.8 % (ref 11.0–14.6)
WBC: 8.3 10*3/uL (ref 4.0–10.3)
lymph#: 2.6 10*3/uL (ref 0.9–3.3)

## 2010-08-24 LAB — COMPREHENSIVE METABOLIC PANEL
ALT: 50 U/L (ref 0–53)
AST: 25 U/L (ref 0–37)
Albumin: 4.6 g/dL (ref 3.5–5.2)
Alkaline Phosphatase: 65 U/L (ref 39–117)
BUN: 17 mg/dL (ref 6–23)
CO2: 18 mEq/L — ABNORMAL LOW (ref 19–32)
Calcium: 9.7 mg/dL (ref 8.4–10.5)
Chloride: 106 mEq/L (ref 96–112)
Creatinine, Ser: 0.8 mg/dL (ref 0.40–1.50)
Glucose, Bld: 96 mg/dL (ref 70–99)
Potassium: 4 mEq/L (ref 3.5–5.3)
Sodium: 140 mEq/L (ref 135–145)
Total Bilirubin: 1 mg/dL (ref 0.3–1.2)
Total Protein: 7.3 g/dL (ref 6.0–8.3)

## 2010-08-24 LAB — PROTEIN ELECTROPHORESIS, SERUM, WITH REFLEX
Albumin ELP: 60.1 % (ref 55.8–66.1)
Alpha-1-Globulin: 4 % (ref 2.9–4.9)
Alpha-2-Globulin: 8.4 % (ref 7.1–11.8)
Beta 2: 5.1 % (ref 3.2–6.5)
Beta Globulin: 6.4 % (ref 4.7–7.2)
Gamma Globulin: 16 % (ref 11.1–18.8)
Total Protein, Serum Electrophoresis: 7.3 g/dL (ref 6.0–8.3)

## 2010-08-24 LAB — FOLATE: Folate: >20 ng/mL

## 2010-08-24 LAB — LACTATE DEHYDROGENASE: LDH: 167 U/L (ref 94–250)

## 2010-08-24 LAB — VITAMIN B12: Vitamin B-12: 688 pg/mL (ref 211–911)

## 2010-08-28 ENCOUNTER — Ambulatory Visit (HOSPITAL_COMMUNITY): Admission: RE | Admit: 2010-08-28 | Discharge: 2010-08-28 | Payer: Self-pay | Admitting: Hematology and Oncology

## 2010-08-31 ENCOUNTER — Ambulatory Visit: Payer: Self-pay | Admitting: Cardiology

## 2010-10-25 ENCOUNTER — Ambulatory Visit: Payer: Self-pay | Admitting: Internal Medicine

## 2010-11-14 ENCOUNTER — Encounter: Payer: Self-pay | Admitting: Cardiology

## 2010-11-14 ENCOUNTER — Ambulatory Visit (HOSPITAL_COMMUNITY): Admission: AD | Admit: 2010-11-14 | Discharge: 2010-11-14 | Payer: Self-pay | Admitting: Cardiology

## 2010-11-14 ENCOUNTER — Ambulatory Visit: Payer: Self-pay | Admitting: Cardiology

## 2010-11-14 ENCOUNTER — Telehealth: Payer: Self-pay | Admitting: Cardiology

## 2010-11-25 ENCOUNTER — Inpatient Hospital Stay (HOSPITAL_COMMUNITY): Admission: EM | Admit: 2010-11-25 | Discharge: 2010-11-28 | Payer: Self-pay | Admitting: Emergency Medicine

## 2010-11-25 ENCOUNTER — Ambulatory Visit: Payer: Self-pay | Admitting: Internal Medicine

## 2010-11-25 ENCOUNTER — Telehealth (INDEPENDENT_AMBULATORY_CARE_PROVIDER_SITE_OTHER): Payer: Self-pay | Admitting: *Deleted

## 2010-11-30 ENCOUNTER — Ambulatory Visit: Payer: Self-pay | Admitting: Cardiology

## 2010-12-01 ENCOUNTER — Telehealth: Payer: Self-pay | Admitting: Nurse Practitioner

## 2010-12-06 ENCOUNTER — Inpatient Hospital Stay (HOSPITAL_COMMUNITY): Admission: EM | Admit: 2010-12-06 | Discharge: 2010-12-01 | Payer: Self-pay | Admitting: Emergency Medicine

## 2010-12-07 ENCOUNTER — Ambulatory Visit: Payer: Self-pay | Admitting: Cardiology

## 2010-12-10 LAB — CONVERTED CEMR LAB
BUN: 14 mg/dL (ref 6–23)
CO2: 29 meq/L (ref 19–32)
Calcium: 9.4 mg/dL (ref 8.4–10.5)
Chloride: 104 meq/L (ref 96–112)
Creatinine, Ser: 0.9 mg/dL (ref 0.4–1.5)
GFR calc non Af Amer: 97.37 mL/min (ref >60.00)
Glucose, Bld: 213 mg/dL — ABNORMAL HIGH (ref 70–99)
Potassium: 3.9 meq/L (ref 3.5–5.1)
Sodium: 140 meq/L (ref 135–145)

## 2010-12-25 ENCOUNTER — Ambulatory Visit: Payer: Self-pay | Admitting: Cardiology

## 2010-12-25 ENCOUNTER — Encounter: Payer: Self-pay | Admitting: Cardiology

## 2010-12-26 LAB — CONVERTED CEMR LAB
BUN: 18 mg/dL (ref 6–23)
CO2: 28 meq/L (ref 19–32)
Calcium: 9.4 mg/dL (ref 8.4–10.5)
Chloride: 106 meq/L (ref 96–112)
Creatinine, Ser: 0.9 mg/dL (ref 0.4–1.5)
GFR calc non Af Amer: 88.86 mL/min (ref >60.00)
Glucose, Bld: 127 mg/dL — ABNORMAL HIGH (ref 70–99)
Magnesium: 1.9 mg/dL (ref 1.5–2.5)
Potassium: 4.1 meq/L (ref 3.5–5.1)
Sodium: 140 meq/L (ref 135–145)

## 2011-01-10 ENCOUNTER — Ambulatory Visit
Admission: RE | Admit: 2011-01-10 | Discharge: 2011-01-10 | Payer: Self-pay | Source: Home / Self Care | Attending: Pulmonary Disease | Admitting: Pulmonary Disease

## 2011-01-29 NOTE — Letter (Signed)
Summary: Generic Letter  [facility]  [address] [place], [place] [address]   Phone: [phone]  Fax: [phone]    04/26/2009  [name] [name] [address] [place], [place]  [address]  Dear [name],  Your stool cards were negative for blood. If you have any questions, please call back at [phone].         Sincerely,   Tor Netters, CMA

## 2011-01-29 NOTE — Progress Notes (Signed)
Summary: refill on protonix  Phone Note From Pharmacy   Caller: [facility] Reason for Call: Needs renewal Details for Reason: Protonix 40mg  Initial call taken by: Romualdo Bolk, CMA Duncan Dull),  November 15, 2009 11:41 AM  Follow-up for Phone Call        Rx sent electronically Follow-up by: Romualdo Bolk, CMA (AAMA),  November 15, 2009 11:41 AM    New/Updated Medications: PROTONIX 40 MG TBEC (PANTOPRAZOLE SODIUM) 1 by mouth once daily Prescriptions: PROTONIX 40 MG TBEC (PANTOPRAZOLE SODIUM) 1 by mouth once daily  #90 x 2   Entered by:   Romualdo Bolk, CMA (AAMA)   Authorized by:   Madelin Headings MD   Signed by:   Romualdo Bolk, CMA (AAMA) on 11/15/2009   Method used:   Electronically to        [facility]* (mail-order)             ,          Ph: [phone]       Fax: [phone]   RxID:   [id number]

## 2011-01-29 NOTE — Assessment & Plan Note (Signed)
Summary: eph per chris B/lg   Visit Type:  Follow-up Primary Provider:  Dr. Isaias Cowman  CC:  no complaints.  History of Present Illness: [name] [name] is a pleasant gentleman who has a history of coronary artery disease status post stent to his LAD in December 2003 as well as paroxysmal atrial fibrillation. Last Myoview was in May of 2010. There was extensive soft tissue attenuation but no ischemia or infarction. The calculated ejection fraction was 48% but visually appeared better. Admitted with frequent PVCs in July 2011. Noted to be bradycardic and beta blocker changed to p.r.n. TSH and enzymes were normal. Last echocardiogram in July 2011 showed normal LV function, mild biatrial enlargement and mild right ventricular enlargement. Since he was discharged, the patient has dyspnea with more extreme activities but not with routine activities. It is relieved with rest. It is not associated with chest pain. There is no orthopnea, PND or pedal edema. There is no syncope or palpitations. There is no exertional chest pain.   Current Medications (verified): 1)  Lipitor 80 Mg Tabs (Atorvastatin Calcium) .[name].. 1 By Mouth Once Daily 2)  Protonix 40 Mg Tbec (Pantoprazole Sodium) .[name].. 1 By Mouth Once Daily 3)  Cardizem 60 Mg Tabs (Diltiazem Hcl) .... As Needed 4)  Levitra 20 Mg Tabs (Vardenafil Hcl) .... As Needed 5)  Famotidine 10 Mg Tabs (Famotidine) .[name].. 1 By Mouth Once Daily 6)  Aspirin 325 Mg  Tabs (Aspirin) 7)  Multivitamins   Tabs (Multiple Vitamin) .... Take 1 Tablet By Mouth Once A Day 8)  Nitrolingual 0.4 Mg/spray Soln (Nitroglycerin) .... One Spray Under Tongue Every 5 Minutes As Needed For Chest Pain---May Repeat Times Three 9)  Ambien 10 Mg Tabs (Zolpidem Tartrate) .... As Needed 10)  Fish Oil 1000 Mg Caps (Omega-3 Fatty Acids) .[name].. 1 Tablet in The Morning and 2 Tablets At Night 11)  Atenolol 25 Mg Tabs (Atenolol) .... 1/2 Tab Once Daily As Needed  Allergies (verified): 1)  ! Penicillin 2)  !  Sulfa 3)  ! * Novacaine 4)  ! 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 Cyst on Epiglottis removed 9/03 Surgery on L4-L5 rupture  x 3  98  Stent:  Successful stenting of the mid left anterior descending resulting in no residual stenosis and TIMI-3 flow. 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  We will continue Integrilin for 18 hours. Plavix will be continued for a minimum of three months given the drug-eluting stent.  Aspirin will be continued indefinitely. Salvadore Farber, M.D. [facility] WED/MEDQ  D:  01/18/2003  T:  01/19/2003  Job:  [id number]  Ulnar Neuropathy surgery Hernia umbilical  repari  Social History: Reviewed history from 08/16/2010 and no changes required. 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 Remaining systems are negative.   Vital Signs:  Patient profile:  60 year old male Height:      71.75 inches Weight:      245 pounds BMI:     33.58 Pulse rate:   73 / minute Pulse rhythm:   regular BP sitting:   142 / 76  (left arm) Cuff size:   large  Vitals Entered By: Caralee Ates CMA (August 31, 2010 9:23 AM)  Physical Exam  General:  Well-developed obese in no acute  distress.  Skin is warm and dry.  HEENT is normal.  Neck is supple. No thyromegaly.  Chest is clear to auscultation with normal expansion.  Cardiovascular exam is regular rate and rhythm.  Abdominal exam nontender or distended. No masses palpated. Extremities show no edema. neuro grossly intact    EKG  Procedure date:  08/31/2010  Findings:      Sinus rhythm at a rate of 73. Occasional PVCs. No ST changes.   Impression & Recommendations:  Problem # 1:  CORONARY ARTERY DISEASE (ICD-414.00) No chest pain. Continue aspirin and statin. Followup Myoview when he returns in one year. His updated medication list for this problem includes:    Cardizem 60 Mg Tabs (Diltiazem hcl) .[name]... As needed    Aspirin 325 Mg Tabs (Aspirin)    Nitrolingual 0.4 Mg/spray Soln (Nitroglycerin) ..... One spray under tongue every 5 minutes as needed for chest pain---may repeat times three    Atenolol 25 Mg Tabs (Atenolol) .[name]... 1/2 tab once daily as needed  Problem # 2:  THROMBOCYTOPENIA (ICD-287.5) Being evaluated by hematology oncology.  Problem # 3:  PALPITATIONS (ICD-785.1) No recent symptoms. Patient does have a history of bradycardia. Continue atenolol as needed. His updated medication list for this problem includes:    Cardizem 60 Mg Tabs (Diltiazem hcl) .[name]... As needed    Aspirin 325 Mg Tabs (Aspirin)    Nitrolingual 0.4 Mg/spray Soln (Nitroglycerin) ..... One spray under tongue every 5 minutes as needed for chest pain---may repeat times three    Atenolol 25 Mg Tabs (Atenolol) .[name]... 1/2 tab once daily as needed  Problem # 4:  HYPERLIPIDEMIA (ICD-272.4) Continue statin. His updated medication list for this problem includes:    Lipitor 80 Mg Tabs (Atorvastatin calcium) .[name]... 1 by mouth once daily  Problem # 5:  ATRIAL FIBRILLATION (ICD-427.31) Patient remains in sinus rhythm. Continue aspirin. No embolic risk factors other than coronary disease. Continue beta blocker p.r.n. His updated  medication list for this problem includes:    Aspirin 325 Mg Tabs (Aspirin)    Atenolol 25 Mg Tabs (Atenolol) .[name]... 1/2 tab once daily as needed  Problem # 6:  SLEEP APNEA, OBSTRUCTIVE (ICD-327.23)  Problem # 7:  GERD (ICD-530.81)  His updated medication list for this problem includes:    Protonix 40 Mg Tbec (Pantoprazole sodium) .[name]... 1 by mouth once daily    Famotidine 10 Mg Tabs (Famotidine) .[name]... 1 by mouth once daily  Patient Instructions: 1)  Your physician recommends that you schedule a follow-up appointment in: ONE YEAR

## 2011-01-29 NOTE — Progress Notes (Signed)
Summary: Nuc pre procedure   Phone Note Outgoing Call Call back at [facility] Phone [phone]   Call placed by: Rea College, CMA,  May 04, 2009 4:13 PM Call placed to: Patient Summary of Call: [name] [name] left message with information on Myoview Information Sheet (see scanned document for details).        Nuclear Med Background Indications for Stress Test: Evaluation for Ischemia, Surgical Clearance  Indications Comments: Hernia repair   History: Asthma, Cardioversion, Echo, Myocardial Infarction, Myocardial Perfusion Study, Stents  History Comments: '03 MI>Stent-LAD;'05 BJY:NWGNFA, EF=62%;'05 Cardioversion for PAT;'08 Echo:EF=55-60%     Nuclear Pre-Procedure Cardiac Risk Factors: Lipids, Obesity, Smoker Height (in): 72

## 2011-01-29 NOTE — Progress Notes (Signed)
   Phone Note From Other Clinic Call back at Work Phone    Caller: dr Larrie Kass nurse at [facility] [place] [facility] Summary of Call: recieved surg clearance dr Hessie Diener wanted to know if pt needs an echo, fax note to them stating no echo needed Meredith Staggers, RN  May 11, 2009 11:58 AM   Follow-up for Phone Call        fowarded to dr [name] Deliah Goody, RN  May 11, 2009 3:12 PM   Additional Follow-up for Phone Call Additional follow up Details #1::        no need for echo per dr [name], note fowarded to central Resa Miner, RN  May 12, 2009 10:38 AM

## 2011-01-29 NOTE — Assessment & Plan Note (Signed)
Summary: CPX / RS/PT RESCD FROM BUMP//CCM   Vital Signs:  Patient profile:   60 year old male Height:      71.75 inches Weight:      344.6 pounds Pulse rate:   66 / minute BP sitting:   140 / 66  (left arm) Cuff size:   large  Vitals Entered By: Romualdo Bolk, CMA Duncan Dull) (April 25, 2010 11:16 AM) CC: CPX   History of Present Illness: [name] [name] comesin comes in today  for preventive visit .Since his last visit he  feels he is is doing well and now off coumadin for his AF    because [place] score is ok.   On asa.  at higher dose . no bleeding problems. Had the abdominal hernia  repair  last summer  AN had surgery and did well.    Got married in [place]   STOPPed  tobacco   since then  but has had  some weight gain since then  .  No treally exercising now OSA  ok    doesnt think sleeping a lot on the cpap . GERD NO change   Preventive Care Screening  Prior Values:    PSA:  0.61 (04/18/2010)    Last Tetanus Booster:  Tdap (03/22/2009)    Last Flu Shot:  Fluvax 3+ (10/24/2009)    Last Pneumovax:  Pneumovax (04/24/2009)   Preventive Screening-Counseling & Management  Alcohol-Tobacco     Alcohol drinks/day: 0     Smoking Status: quit     Smoking Cessation Counseling: yes     Smoke Cessation Stage: quit     Packs/Day: 2.0     Year Quit: 08/2009  Caffeine-Diet-Exercise     Caffeine use/day: 0     Does Patient Exercise: yes     Type of exercise: walking     Exercise (avg: min/session): <30     Times/week: 3  Hep-HIV-STD-Contraception     Dental Visit-last 6 months yes     Sun Exposure-Excessive: no  Safety-Violence-Falls     Seat Belt Use: yes     Firearms in the Home: firearms in the home     Firearm Counseling: to practice firearm safety     Smoke Detectors: yes      Blood Transfusions:  no.    Current Medications (verified): 1)  Lipitor 80 Mg Tabs (Atorvastatin Calcium) .[name].. 1 By Mouth Once Daily 2)  Protonix 40 Mg Tbec (Pantoprazole Sodium) .[name].. 1 By Mouth  Once Daily 3)  Cardizem 60 Mg Tabs (Diltiazem Hcl) .... As Needed 4)  Levitra 20 Mg Tabs (Vardenafil Hcl) .... As Needed 5)  Famotidine 10 Mg Tabs (Famotidine) .[name].. 1 By Mouth Once Daily 6)  Aspirin 325 Mg  Tabs (Aspirin) 7)  Multivitamins   Tabs (Multiple Vitamin) .... Take 1 Tablet By Mouth Once A Day 8)  Nitrolingual 0.4 Mg/spray Soln (Nitroglycerin) .... One Spray Under Tongue Every 5 Minutes As Needed For Chest Pain---May Repeat Times Three 9)  Ambien 10 Mg Tabs (Zolpidem Tartrate) .... As Needed 10)  Fish Oil 1000 Mg Caps (Omega-3 Fatty Acids)  Allergies (verified): 1)  ! Penicillin 2)  ! Sulfa 3)  ! * Novacaine 4)  ! * Pseudoephedrine  Past History:  Past medical, surgical, family and social histories (including risk factors) reviewed, and no changes noted (except as noted below).  Past Medical History: Reviewed history from 11/13/2009 and no changes required. Current Problems:  CORONARY ARTERY DISEASE (ICD-414.00) HYPERLIPIDEMIA (ICD-272.4) ATRIAL FIBRILLATION (  ICD-427.31) ERECTILE DYSFUNCTION (ICD-302.72) UMBILICAL HERNIA (ICD-553.1) SLEEP APNEA, OBSTRUCTIVE (ICD-327.23) ULNAR NEUROPATHY, LEFT (ICD-354.2) GERD (ICD-530.81)  Past Surgical History: Reviewed history from 11/08/2009 and no changes required. Cyst on Epiglottis removed 9/03 Surgery on L4-L5 rupture  x 3  98  Stent:  Successful stenting of the mid left anterior descending resulting in no residual stenosis and TIMI-3 flow. 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  We will continue Integrilin for 18 hours. Plavix will be continued for a minimum of three months given the drug-eluting stent.  Aspirin will be continued indefinitely. Salvadore Farber, M.D. [facility] WED/MEDQ  D:  01/18/2003  T:  01/19/2003  Job:  [id number]  Ulnar Neuropathy surgery Hernia umbilical   Past History:  Care Management: Cardiology: Dr.  [name] Surgery: Dr. Ezzard Standing- Allergy: Dr. Jasper Callas Pulmonary:  [facility]   Family History: Reviewed history from 03/22/2009 and no changes required. Father: Cancer- Lung Mother: Thyroid problem, ovarian cancer Siblings: None  Social History: Reviewed history from 10/24/2009 and no changes required. Retired now married   hh of 2   1 dog  Ex  Smoker recently quit  Alcohol use-no Drug use-no Regular exercise-yes Dental Care w/in 6 mos.:  yes Sun Exposure-Excessive:  no Blood Transfusions:  no Seat Belt Use:  yes  Review of Systems       The patient complains of weight gain.  The patient denies anorexia, fever, weight loss, vision loss, decreased hearing, hoarseness, chest pain, syncope, dyspnea on exertion, peripheral edema, prolonged cough, headaches, hemoptysis, abdominal pain, melena, hematochezia, severe indigestion/heartburn, hematuria, incontinence, genital sores, muscle weakness, transient blindness, difficulty walking, depression, unusual weight change, abnormal bleeding, enlarged lymph nodes, and testicular masses.         ocass numness tingling in hands at nigh  helpw with cho strap on forearm  no weakness  numbness in great toe from previous back prblem and surgery Physical Exam General Appearance: well developed, well nourished, no acute distress Eyes: conjunctiva and lids normal, PERRLA, EOMI, WNL  glases  Ears, Nose, Mouth, Throat: TM clear, nares clear, oral exam WNL Neck: supple, no lymphadenopathy, no thyromegaly, no JVD Respiratory: clear to auscultation and percussion, respiratory effort normal Cardiovascular: regular rate and rhythm, S1-S2, no murmur, rub or gallop, no bruits, peripheral pulses normal and symmetric, no cyanosis, clubbing, edema or varicosities Chest: no scars, masses, tenderness; no asymmetry, skin changes, nipple discharge, no gynecomastia   Gastrointestinal: soft, non-tender; no hepatosplenomegaly, masses; active bowel sounds all quadrants, ; no  masses, tenderness, hemorrhoids  Genitourinary: no hernia, or prostate enlargement Lymphatic: no cervical, axillary or inguinal adenopathy Musculoskeletal: gait normal, muscle tone and strength WNL, no joint swelling, effusions, discoloration, crepitus  Skin: clear, good turgor, color WNL, no rashes, lesions, or ulcerations Neurologic: normal mental status, normal reflexes, normal strength, sensation, and motion Psychiatric: alert; oriented to person, place and time Other Exam: see labs  nl except lfts and bg     Impression & Recommendations:  Problem # 1:  HEALTH MAINTENANCE EXAM, ADULT (ICD-V70.0) Discussed nutrition,exercise,diet,healthy weight, vitamin D and calcium.     Needs colonoscopy  requests Dr Loreta [address].  Problem # 2:  LIVER FUNCTION TESTS, ABNORMAL (ICD-794.8) Assessment: New last tests were nl  .  w no change in meds  and recheck in 3 months   Problem # 3:  OBESITY, UNSPECIFIED (ICD-278.00) Assessment: Deteriorated moribid  related to tobacco a cessation  nd getting married  prolonged counseling  in this regard .  Problem # 4:  CAD (ICD-414.00) Assessment: Unchanged  His updated medication list for this problem includes:    Cardizem 60 Mg Tabs (Diltiazem hcl) .[name]... As needed    Aspirin 325 Mg Tabs (Aspirin)    Nitrolingual 0.4 Mg/spray Soln (Nitroglycerin) ..... One spray under tongue every 5 minutes as needed for chest pain---may repeat times three  Problem # 5:  HYPERLIPIDEMIA (ICD-272.4) weight loss will help the hdl had se of niacin in past  His updated medication list for this problem includes:    Lipitor 80 Mg Tabs (Atorvastatin calcium) .[name]... 1 by mouth once daily  Problem # 6:  HYPERGLYCEMIA (ICD-790.29) at risk   pre diabetic type problem   Problem # 7:  ERECTILE DYSFUNCTION (ICD-302.72)  His updated medication list for this problem includes:      Levitra 20 Mg Tabs (Vardenafil hcl) .[name]... As needed  Problem # 8:  TOBACCO USE, QUIT (ICD-V15.82) congrats  and counseling on this   Problem # 9:  ATRIAL FIBRILLATION (ICD-427.31) stable off coumadin  for now ... disc  if develops DM that  elevated his [place] score and risk forCV events.   The following medications were removed from the medication list:    Warfarin Sodium 5 Mg Tabs (Warfarin sodium) .[name]... Take as directed by coumadin clinic. His updated medication list for this problem includes:    Cardizem 60 Mg Tabs (Diltiazem hcl) .[name]... As needed    Aspirin 325 Mg Tabs (Aspirin)  Problem # 10:  SLEEP APNEA, OBSTRUCTIVE (ICD-327.23) Assessment: Comment Only  Complete Medication List: 1)  Lipitor 80 Mg Tabs (Atorvastatin calcium) .[name].. 1 by mouth once daily 2)  Protonix 40 Mg Tbec (Pantoprazole sodium) .[name].. 1 by mouth once daily 3)  Cardizem 60 Mg Tabs (Diltiazem hcl) .... As needed 4)  Levitra 20 Mg Tabs (Vardenafil hcl) .... As needed 5)  Famotidine 10 Mg Tabs (Famotidine) .[name].. 1 by mouth once daily 6)  Aspirin 325 Mg Tabs (Aspirin) 7)  Multivitamins Tabs (Multiple vitamin) .... Take 1 tablet by mouth once a day 8)  Nitrolingual 0.4 Mg/spray Soln (Nitroglycerin) .... One spray under tongue every 5 minutes as needed for chest pain---may repeat times three 9)  Ambien 10 Mg Tabs (Zolpidem tartrate) .... As needed 10)  Fish Oil 1000 Mg Caps (Omega-3 fatty acids)  Other Orders: Gastroenterology Referral (GI)  Patient Instructions: 1)  Congrats on stop smoking   2)  Losing weight     will help  your medical problems.Refer for colonoscopy . 3)  Mediterranean diet is heart healthy  but weight loss  is reducing energy intake and increasing energy expenditure 4)  You need to use up 3500 calories more than intake to lose one pound of body weight.  5)  Rec monitoring  and recording eating drinking  and activiy for 2 weeks before implementing  change .  6)  Labs in 3-4  months  7)  Hepatic Panel prior to visit ICD-9:  790.4 8)  HgBA1c prior to visit  ICD-9:  790.2 Prescriptions: AMBIEN 10 MG TABS  (ZOLPIDEM TARTRATE) as needed  #90 x 1   Entered and Authorized by:   Madelin Headings MD   Signed by:   Madelin Headings MD on 04/25/2010   Method used:   Print then Give to Patient   RxID:   [id number] PROTONIX 40 MG TBEC (PANTOPRAZOLE SODIUM) 1 by mouth once daily  #90 x 3   Entered and  Authorized by:   Madelin Headings MD   Signed by:   Madelin Headings MD on 04/25/2010   Method used:   Electronically to        [facility] (mail-order)             ,          Ph: [phone]       Fax: [phone]   RxID:   [id number] LIPITOR 80 MG TABS (ATORVASTATIN CALCIUM) 1 by mouth once daily  #90 x 3   Entered and Authorized by:   Madelin Headings MD   Signed by:   Madelin Headings MD on 04/25/2010   Method used:   Electronically to        [facility]* (mail-order)             ,          Ph: [phone]       Fax: [phone]   RxID:   [id number]

## 2011-01-29 NOTE — Letter (Signed)
Summary: [facility]   Imported By: Maryln Gottron 05/11/2009 08:08:39  _____________________________________________________________________  External Attachment:    Type:   Image     Comment:   External Document

## 2011-01-29 NOTE — Letter (Signed)
Summary: [facility]  [facility]   Imported By: Maryln Gottron 09/10/2010 15:43:15  _____________________________________________________________________  External Attachment:    Type:   Image     Comment:   External Document

## 2011-01-29 NOTE — Miscellaneous (Signed)
Summary: med update  Clinical Lists Changes  Medications: Changed medication from WARFARIN SODIUM 5 MG TABS (WARFARIN SODIUM) 5 once daily except on mondays 10mg  to WARFARIN SODIUM 5 MG TABS (WARFARIN SODIUM) Take as directed by coumadin clinic.

## 2011-01-29 NOTE — Assessment & Plan Note (Signed)
Summary: f1y/dm  Medications Added CARDIZEM 60 MG TABS (DILTIAZEM HCL) as needed LEVITRA 20 MG TABS (VARDENAFIL HCL) as needed ASPIRIN 81 MG  TABS (ASPIRIN) Take 1 tablet by mouth once a day MULTIVITAMINS   TABS (MULTIPLE VITAMIN) Take 1 tablet by mouth once a day NITROLINGUAL 0.4 MG/SPRAY SOLN (NITROGLYCERIN) One spray under tongue every 5 minutes as needed for chest pain---may repeat times three AMBIEN 10 MG TABS (ZOLPIDEM TARTRATE) as needed        Visit Type:  1 year follow up  CC:  abdominal pain.  History of Present Illness: [name] [name] is a pleasant gentleman who has a history of coronary artery disease status post stent to his LAD in December 2003 as well as paroxysmal atrial fibrillation. Last Myoview was in May of 2010. There was extensive soft tissue attenuation but no ischemia or infarction. The calculated ejection fraction was 48% but visually appeared better. I last saw him in November of 2009. Since then he denies any orthopnea, PND or pedal edema. He does have some dyspnea with more extreme activities relieved with rest. It is not associated with chest pain. There is no exertional chest pain, palpitations or syncope.  Current Medications (verified): 1)  Lipitor 80 Mg Tabs (Atorvastatin Calcium) .[name].. 1 By Mouth Once Daily 2)  Warfarin Sodium 5 Mg Tabs (Warfarin Sodium) .... Take As Directed By Coumadin Clinic. 3)  Protonix 40 Mg Tbec (Pantoprazole Sodium) .[name].. 1 By Mouth Once Daily 4)  Cardizem 60 Mg Tabs (Diltiazem Hcl) .... As Needed 5)  Levitra 20 Mg Tabs (Vardenafil Hcl) .... As Needed 6)  Famotidine 10 Mg Tabs (Famotidine) .[name].. 1 By Mouth Once Daily 7)  Aspirin 81 Mg  Tabs (Aspirin) .... Take 1 Tablet By Mouth Once A Day 8)  Multivitamins   Tabs (Multiple Vitamin) .... Take 1 Tablet By Mouth Once A Day 9)  Nitrolingual 0.4 Mg/spray Soln (Nitroglycerin) .... One Spray Under Tongue Every 5 Minutes As Needed For Chest Pain---May Repeat Times Three 10)  Ambien 10 Mg Tabs  (Zolpidem Tartrate) .... As Needed  Allergies: 1)  ! Penicillin 2)  ! Sulfa 3)  ! * Novacaine 4)  ! 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 Cyst on Epiglottis removed 9/03 Surgery on L4-L5 rupture  x 3  98  Stent:  Successful stenting of the mid left anterior descending resulting in no residual stenosis and TIMI-3 flow. 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  We will continue Integrilin for 18 hours. Plavix will be continued for a minimum of three months given the drug-eluting stent.  Aspirin will be continued indefinitely. Salvadore Farber, M.D. [facility] WED/MEDQ  D:  01/18/2003  T:  01/19/2003  Job:  [id number]  Ulnar Neuropathy surgery Hernia umbilical   Social History: Reviewed history from 10/24/2009 and no changes required. Retired now married  Ex  Smoker recently quit  Alcohol use-no Drug use-no Regular exercise-yes  Review of Systems       no fevers or chills, productive cough, hemoptysis, dysphasia, odynophagia, melena, hematochezia, dysuria, hematuria, rash, seizure activity, orthopnea, PND, pedal edema, claudication. Remaining systems are negative.   Vital Signs:  Patient profile:   60  year old male Height:      72 inches Weight:      319 pounds BMI:     43.42 Pulse rate:   52 / minute Pulse rhythm:   regular Resp:     18 per minute BP sitting:   122 / 70  (left arm) Cuff size:   large  Vitals Entered By: Vikki Ports (November 13, 2009 11:11 AM)  Physical Exam  General:  well-developed obese in no  acute distress. Skin is warm and dry. Head:  HEENT is normal. Neck:  supple with no bruits. No thyromegaly. Lungs:  clear to auscultation Heart:  regular rate and rhythm Abdomen:  soft and nontender. No masses palpated. Extremities:  trace edema. Neurologic:  grossly intact.   EKG  Procedure date:  11/13/2009  Findings:      Sinus rhythm at a rate of 52. Axis normal. No ST changes.  Impression & Recommendations:  Problem # 1:  CORONARY ARTERY DISEASE (ICD-414.00) No recent chest pain. Continue aspirin and statin. Last Myoview showed no ischemia. The following medications were removed from the medication list:    Nitro-dur 0.4 Mg/hr Pt24 (Nitroglycerin) His updated medication list for this problem includes:    Warfarin Sodium 5 Mg Tabs (Warfarin sodium) .[name]... Take as directed by coumadin clinic.    Cardizem 60 Mg Tabs (Diltiazem hcl) .[name]... As needed    Aspirin 81 Mg Tabs (Aspirin) .[name]... Take 1 tablet by mouth once a day    Nitrolingual 0.4 Mg/spray Soln (Nitroglycerin) ..... One spray under tongue every 5 minutes as needed for chest pain---may repeat times three  Orders: EKG w/ Interpretation (93000)  Problem # 2:  HYPERLIPIDEMIA (ICD-272.4) Continue statin. Check lipids and liver. His updated medication list for this problem includes:    Lipitor 80 Mg Tabs (Atorvastatin calcium) .[name]... 1 by mouth once daily  Orders: TLB-Lipid Panel (80061-LIPID)  Problem # 3:  ATRIAL FIBRILLATION (ICD-427.31) Patient remains in sinus rhythm. I have reviewed his embolic risk factors with him. He does not have diabetes, hypertension, prior CVA, history of CHF or age greater than 26. He does have a history of coronary disease. We have therefore elected to discontinue his Coumadin and instead treat with enteric coated aspirin 325 mg p.o. daily. He is in agreement with this. We will notify the Coumadin clinic that this is discontinuing. His updated medication list for this problem includes:     Warfarin Sodium 5 Mg Tabs (Warfarin sodium) .[name]... Take as directed by coumadin clinic.    Aspirin 81 Mg Tabs (Aspirin) .[name]... Take 1 tablet by mouth once a day  Problem # 4:  SLEEP APNEA, OBSTRUCTIVE (ICD-327.23)  Problem # 5:  GERD (ICD-530.81)  His updated medication list for this problem includes:    Protonix 40 Mg Tbec (Pantoprazole sodium) .[name]... 1 by mouth once daily    Famotidine 10 Mg Tabs (Famotidine) .[name]... 1 by mouth once daily  Other Orders: TLB-Hepatic/Liver Function Pnl (80076-HEPATIC)  Patient Instructions: 1)  Your physician recommends that you schedule a follow-up appointment in: 12 MONTHS DUE NOVEMBER 2011 2)  Your physician recommends that you return for lab work ZO:XWRUE LIPID LIVER 3)  Your physician has recommended you make the following change in your medication: STOP COUMADIN AND INCREASE ASPRIN TO 325 MG EVERY DAY Prescriptions: NITROLINGUAL 0.4 MG/SPRAY SOLN (NITROGLYCERIN) One spray under tongue every 5 minutes as needed for chest pain---may repeat times three  #1 x 4   Entered by:   Scherrie Bateman,  LPN   Authorized by:   Ferman Hamming, MD, [facility]   Signed by:   Scherrie Bateman, LPN on 96/29/5284   Method used:   Electronically to        [facility]  [phone]* (retail)       [address] [place], [place]  [address]       Ph: [phone] or [phone]       Fax: [phone]   RxID:   [id number] CARDIZEM 60 MG TABS (DILTIAZEM HCL) as needed  #30 x 2   Entered by:   Scherrie Bateman, LPN   Authorized by:   Ferman Hamming, MD, [facility]   Signed by:   Scherrie Bateman, LPN on 32/35/5732   Method used:   Electronically to        [facility]  [phone]* (retail)       [address] [place], [place]  [address]       Ph: [phone] or [phone]       Fax: [phone]   RxID:   [id number]

## 2011-01-29 NOTE — Letter (Signed)
Summary: [facility]   Imported By: Maryln Gottron 09/21/2009 13:26:19  _____________________________________________________________________  External Attachment:    Type:   Image     Comment:   External Document

## 2011-01-29 NOTE — Medication Information (Signed)
Summary: [email or web address]  Anticoagulant Therapy  Managed by: Weston Brass, PharmD Referring MD: Olga Millers MD Supervising MD: Myrtis Ser MD, Tinnie Gens Indication 1: Atrial Fibrillation (ICD-427.31) Lab Used: [facility] Site: [facility] INR POC 2.2 INR RANGE 2 - 3  Dietary changes: no    Health status changes: no    Bleeding/hemorrhagic complications: no    Recent/future hospitalizations: no    Any changes in medication regimen? no    Recent/future dental: no  Any missed doses?: no       Is patient compliant with meds? yes       Allergies: 1)  ! Penicillin 2)  ! Sulfa 3)  ! * Novacaine 4)  ! * Pseudoephedrine  Anticoagulation Management History:      The patient is taking warfarin and comes in today for a routine follow up visit.  Negative risk factors for bleeding include an age less than 95 years old.  The bleeding index is 'low risk'.  Negative CHADS2 values include Age > 62 years old.  The start date was 11/09/2004.  Anticoagulation responsible provider: Myrtis Ser MD, Tinnie Gens.  INR POC: 2.2.  Cuvette Lot#: [id number].  Exp: 09/2010.    Anticoagulation Management Assessment/Plan:      The patient's current anticoagulation dose is Warfarin sodium 5 mg tabs: Take as directed by coumadin clinic..  The target INR is 2 - 3.  The next INR is due 10/10/2009.  Anticoagulation instructions were given to patient.  Results were reviewed/authorized by Weston Brass, PharmD.  He was notified by Weston Brass PharmD.         Prior Anticoagulation Instructions: INR 1.7  Take 2 tablets today and 1.5 tablet tomorrow. Then resume on the same dose: 1 tablet daily except 2 tablet on Mondays.    Current Anticoagulation Instructions: INR 2.2   Take 5 mg everyday except 10mg  on Monday

## 2011-01-29 NOTE — Medication Information (Signed)
Summary: Coumadin Clinic  Anticoagulant Therapy  Managed by: Inactive Referring MD: Olga Millers MD Supervising MD: Juanda Chance MD, Janey Petron Indication 1: Atrial Fibrillation (ICD-427.31) Lab Used: [facility] Site: [facility] INR RANGE 2 - 3          Comments: Per Dr. [name] (Christine RN), patient's Coumadin has been discontinued.  Allergies: 1)  ! Penicillin 2)  ! Sulfa 3)  ! * Novacaine 4)  ! * Pseudoephedrine  Anticoagulation Management History:      Negative risk factors for bleeding include an age less than 6 years old.  The bleeding index is 'low risk'.  Negative CHADS2 values include Age > 58 years old.  The start date was 11/09/2004.  Anticoagulation responsible provider: Juanda Chance MD, Smitty Cords.  Exp: 09/2010.    Anticoagulation Management Assessment/Plan:      The patient's current anticoagulation dose is Warfarin sodium 5 mg tabs: Take as directed by coumadin clinic..  The target INR is 2 - 3.  The next INR is due 12/05/2009.  Anticoagulation instructions were given to patient.  Results were reviewed/authorized by Inactive.         Prior Anticoagulation Instructions: INR 2.2  Continue on same dosage 1 tablet daily except 2 tablets on Mondays.  Recheck in 4 weeks.

## 2011-01-29 NOTE — Assessment & Plan Note (Signed)
Summary: CPX/RCD   Vital Signs:  Patient profile:   60 year old male Height:      72 inches Weight:      313 pounds Pulse rate:   50 / minute BP sitting:   120 / 70  (left arm) Cuff size:   large  Vitals Entered By: Romualdo Bolk, CMA (April 24, 2009 10:52 AM) CC: CPX   History of Present Illness: [name] [name] comes in today for CPX. See last ov.  Interim hx : Injury  on April 8th and saw Dr Applingtin and had pop in heel  . partial plantar fascia  tear  .  conservative treatment . Given celebrex.  didnt take it . treated with local care.      Platelets count  repeated at Dr Freida Busman office.   to do mesh laparascopic.   Cards   needed clearance  for this  . to have  for abdominal surgery ... hernia surgery.  Preventive Screening-Counseling & Management     Alcohol drinks/day: 0     Smoking Status: current     Smoking Cessation Counseling: yes     Smoke Cessation Stage: contemplative     Packs/Day: 2.0     Caffeine use/day: 0     Does Patient Exercise: yes     Type of exercise: walking     Exercise (avg: min/session): <30     Times/week: 3  Current Medications (verified): 1)  Lipitor 80 Mg Tabs (Atorvastatin Calcium) .[name].. 1 By Mouth Once Daily 2)  Warfarin Sodium 5 Mg Tabs (Warfarin Sodium) .... 5 Once Daily Except On Mondays 10mg  3)  Protonix 40 Mg Tbec (Pantoprazole Sodium) .[name].. 1 By Mouth Once Daily 4)  Cardizem 60 Mg Tabs (Diltiazem Hcl) .[name].. 1 By Mouth Once Daily As Needed 5)  Levitra 20 Mg Tabs (Vardenafil Hcl) 6)  Famotidine 10 Mg Tabs (Famotidine) .[name].. 1 By Mouth Once Daily 7)  Metoprolol Tartrate 25 Mg Tabs (Metoprolol Tartrate) .[name].. 1 By Mouth Two Times A Day 8)  Aspirin 81 Mg  Tabs (Aspirin) 9)  Multivitamins   Tabs (Multiple Vitamin) 10)  Nitro-Dur 0.4 Mg/hr Pt24 (Nitroglycerin)  Allergies (verified): 1)  ! Penicillin 2)  ! Sulfa  Past History:  Past medical, surgical, family and social histories (including risk factors) reviewed, and no changes  noted (except as noted below).  Past Medical History:    Reviewed history from 03/22/2009 and no changes required:    Atrial fibrillation    GERD    Myocardial infarction, hx of    Hyperlipidemia    Coronary artery disease  Past Surgical History:    Cyst on Epiglottis removed 9/03    Surgery on L4-L5 rupture  x 3  98    Stent    Ulnar Neuropathy surgery  Family History:    Reviewed history from 03/22/2009 and no changes required:       Father: Cancer- Lung       Mother: Thyroid problem, ovarian cancer       Siblings: None  Social History:    Reviewed history from 03/22/2009 and no changes required:       Retired       Development worker, community        Current Smoker       Alcohol use-no       Drug use-no       Regular exercise-yes  Review of Systems  The patient denies anorexia, fever, weight loss, chest pain, syncope, dyspnea on  exertion, peripheral edema, prolonged cough, abdominal pain, melena, hematochezia, severe indigestion/heartburn, hematuria, muscle weakness, suspicious skin lesions, depression, unusual weight change, abnormal bleeding, enlarged lymph nodes, and angioedema.   Physical Exam General Appearance: well developed, well nourished, no acute distress Eyes: conjunctiva and lids normal, PERRLA, EOMI, WNL Ears, Nose, Mouth, Throat: TM clear, nares clear, oral exam WNL Neck: supple, no lymphadenopathy, no thyromegaly, no JVD Respiratory: clear to auscultation and percussion, respiratory effort normal Cardiovascular: regular rate and rhythm, S1-S2, no murmur, rub or gallop, no bruits, peripheral pulses normal and symmetric, no cyanosis, clubbing, edema or varicosities Chest: no , masses, tenderness; no asymmetry, skin changes, nipple discharge, no gynecomastia   Gastrointestinal: soft, non-tender; no hepatosplenomegaly, masses; active bowel sounds all quadrants, umbilical hernia noted  guaiac negative stool; no masses, tenderness, hemorrhoids  Genitourinary: no, or prostate  enlargement Lymphatic: no cervical, axillary or inguinal adenopathy Musculoskeletal: gait normal, muscle tone and strength WNL, no joint swelling, effusions, discoloration, crepitus  Skin: clear, good turgor, color WNL, no rashes, lesions, or ulcerations Neurologic: normal mental status, normal reflexes, normal strength, sensation, and motion Psychiatric: alert; oriented to person, place and time Other Exam:     Impression & Recommendations:  Problem # 1:  HEALTH MAINTENANCE EXAM, ADULT (ICD-V70.0)  Discussed nutrition,exercise,diet,healthy weight, vitamin D and calcium.   Reviewed preventive care protocols, scheduled due services, and updated immunizations.  Problem # 2:  UMBILICAL HERNIA (ICD-553.1) to have surgery.  Problem # 3:  TOBACCO USE (ICD-305.1)  Encouraged smoking cessation and discussed different methods for smoking cessation.   Problem # 4:  CORONARY ARTERY DISEASE (ICD-414.00)  His updated medication list for this problem includes:    Cardizem 60 Mg Tabs (Diltiazem hcl) .[name]... 1 by mouth once daily as needed    Metoprolol Tartrate 25 Mg Tabs (Metoprolol tartrate) .[name]... 1 by mouth two times a day    Aspirin 81 Mg Tabs (Aspirin)    Nitro-dur 0.4 Mg/hr Pt24 (Nitroglycerin)  Problem # 5:  SLEEP APNEA, OBSTRUCTIVE (ICD-327.23) Assessment: Comment Only  Problem # 6:  HYPERLIPIDEMIA (ICD-272.4)  His updated medication list for this problem includes:    Lipitor 80 Mg Tabs (Atorvastatin calcium) .[name]... 1 by mouth once daily  Labs Reviewed: SGOT: 27 (03/22/2009)   SGPT: 40 (03/22/2009)   HDL:30.90 (03/22/2009)  LDL:48 (03/22/2009)  Chol:97 (03/22/2009)  Trig:91.0 (03/22/2009)  Problem # 7:  OBESITY, UNSPECIFIED (ICD-278.00) Assessment: Comment Only  Complete Medication List: 1)  Lipitor 80 Mg Tabs (Atorvastatin calcium) .[name].. 1 by mouth once daily 2)  Warfarin Sodium 5 Mg Tabs (Warfarin sodium) .... 5 once daily except on mondays 10mg  3)  Protonix 40 Mg Tbec  (Pantoprazole sodium) .[name].. 1 by mouth once daily 4)  Cardizem 60 Mg Tabs (Diltiazem hcl) .[name].. 1 by mouth once daily as needed 5)  Levitra 20 Mg Tabs (Vardenafil hcl) 6)  Famotidine 10 Mg Tabs (Famotidine) .[name].. 1 by mouth once daily 7)  Metoprolol Tartrate 25 Mg Tabs (Metoprolol tartrate) .[name].. 1 by mouth two times a day 8)  Aspirin 81 Mg Tabs (Aspirin) 9)  Multivitamins Tabs (Multiple vitamin) 10)  Nitro-dur 0.4 Mg/hr Pt24 (Nitroglycerin)  Other Orders: Pneumococcal Vaccine (42706) Admin 1st Vaccine (23762)  Patient Instructions: 1)  encourage     stress test  as per cardiology and surgeon.  2)  Still try to stop smoking.  3)  Lose weight   . 4)  otherwise return office visit in  6 months or as needed.    Immunizations Administered:  Pneumonia Vaccine:    Vaccine Type: Pneumovax    Site: left deltoid    Mfr: Merck    Dose: 0.5 ml    Route: IM    Given by: Romualdo Bolk, CMA    Exp. Date: 02/03/2010    Lot #: [id number]

## 2011-01-29 NOTE — Progress Notes (Signed)
Summary: irregular heart beats  Phone Note Call from Patient Call back at [phone]   Caller: Patient Summary of Call: Pt having irregular heart beats Initial call taken by: Judie Grieve,  November 14, 2010 10:09 AM  Follow-up for Phone Call        spoke with pt, started this am after breakfast, pt went to the bathroom. he felt like PVC, he took his pulse and it was irregular. he took 12.5mg  of atenolol. he does not feel the rate is elevated. this to him feels like atrial fib. no chest pain or SOB. he will come to the [place] office now to be seen Deliah Goody, RN  November 14, 2010 10:22 AM

## 2011-01-29 NOTE — Medication Information (Signed)
Summary: [email or web address]  Anticoagulant Therapy  Managed by: Shelby Dubin PharmD, BCPS, CPP Referring MD: Olga Millers MD Supervising MD: Gala Romney MD, Reuel Boom Indication 1: Atrial Fibrillation (ICD-427.31) Lab Used: [facility] Site: [facility] PT 17.8 INR POC 2.1 INR RANGE 2 - 3  Dietary changes: no    Health status changes: no    Bleeding/hemorrhagic complications: no    Recent/future hospitalizations: no    Any changes in medication regimen? no    Recent/future dental: no  Any missed doses?: no       Is patient compliant with meds? yes       Allergies: 1)  ! Penicillin 2)  ! Sulfa 3)  ! * Novacaine 4)  ! * Pseudoephedrine  Anticoagulation Management History:      The patient is taking warfarin and comes in today for a routine follow up visit.  Negative risk factors for bleeding include an age less than 28 years old.  The bleeding index is 'low risk'.  Negative CHADS2 values include Age > 65 years old.  The start date was 11/09/2004.  Prothrombin time is 17.8.  Anticoagulation responsible provider: Bensimhon MD, Reuel Boom.  INR POC: 2.1.  Cuvette Lot#: [id number] .  Exp: 06/2010.    Anticoagulation Management Assessment/Plan:      The patient's current anticoagulation dose is Warfarin sodium 5 mg tabs: Take as directed by coumadin clinic..  The target INR is 2 - 3.  The next INR is due 08/08/2009.  Anticoagulation instructions were given to patient.  Results were reviewed/authorized by Shelby Dubin PharmD, BCPS, CPP.  He was notified by Shelby Dubin PharmD, BCPS, CPP.         Prior Anticoagulation Instructions: INR 2.9 today  The patient is to continue with the same dose of coumadin. Take 1 tablet (5mg ) every day except Monday take 2 tablets (10mg )  Current Anticoagulation Instructions: INR 2.1 Take 2 tabs each Monday and 1 tab on other days.

## 2011-01-29 NOTE — Medication Information (Signed)
Summary: rov  Anticoagulant Therapy  Managed by: Shelby Dubin PharmD, BCPS, CPP Referring MD: Olga Millers MD Supervising MD: Gala Romney MD, Reuel Boom Indication 1: Atrial Fibrillation (ICD-427.31) Lab Used: [facility] 20.7 INR POC 2.9  Dietary changes: yes       Details: ate a few more brocoli  Health status changes: no    Bleeding/hemorrhagic complications: no    Recent/future hospitalizations: yes       Details: umbilical hernia removal at the end of july. patient states that he had a stress test and the cardiologist has approved his coumadin stop of a week prior to surgery  Any changes in medication regimen? yes       Details: stopped taking metoprolol due to bradycardia  Recent/future dental: no  Any missed doses?: yes     Details: Missed 5mg  dose last night   Comments: platelet count has gone up   Allergies (verified): 1)  ! Penicillin 2)  ! Sulfa 3)  ! * Novacaine 4)  ! * Pseudoephedrine  Anticoagulation Management History:      The patient is on coumadin and comes in today for a routine follow up visit.  Negative risk factors for bleeding include an age less than 78 years old.  The bleeding index is 'low risk'.  Negative CHADS2 values include Age > 29 years old.  The start date was 11/09/2004.    Anticoagulation Management Assessment/Plan:      The patient's current anticoagulation dose is Warfarin sodium 5 mg tabs: Take as directed by coumadin clinic.[name]  He is to have a 07/11/2009.  Anticoagulation instructions were given to patient.  Results were reviewed/authorized by Shelby Dubin PharmD, BCPS, CPP.  He was notified by Mayo Ao Candidate.         Prior Anticoagulation Instructions: 5MG  QD/10MG  M  Current Anticoagulation Instructions: INR 2.9 today  The patient is to continue with the same dose of coumadin. Take 1 tablet (5mg ) every day except Monday take 2 tablets (10mg )

## 2011-01-29 NOTE — Consult Note (Signed)
Summary: [facility]   Imported By: Maryln Gottron 08/21/2010 15:44:28  _____________________________________________________________________  External Attachment:    Type:   Image     Comment:   External Document

## 2011-01-29 NOTE — Medication Information (Signed)
Summary: Coumadin Clinic  Anticoagulant Therapy  Managed by: Inactive Referring MD: Olga Millers MD Supervising MD: Antoine Poche MD, Fayrene Fearing Indication 1: Atrial Fibrillation (ICD-427.31) Lab Used: [facility] Site: [facility] INR RANGE 2 - 3          Comments: Pt. wanted to be followed by Dr. Fabian Sharp. Her office called and she agreed to follow and dose coumadin. Thus, pt made inactive.   Allergies: 1)  ! Penicillin 2)  ! Sulfa 3)  ! * Novacaine 4)  ! * Pseudoephedrine  Anticoagulation Management History:      Negative risk factors for bleeding include an age less than 34 years old.  The bleeding index is 'low risk'.  Negative CHADS2 values include Age > 67 years old.  The start date was 11/09/2004.  Anticoagulation responsible provider: Antoine Poche MD, Fayrene Fearing.  Exp: 09/2010.    Anticoagulation Management Assessment/Plan:      The patient's current anticoagulation dose is Warfarin sodium 5 mg tabs: Take as directed by coumadin clinic..  The target INR is 2 - 3.  The next INR is due 12/05/2009.  Anticoagulation instructions were given to patient.  Results were reviewed/authorized by Inactive.         Prior Anticoagulation Instructions: INR 2.2  Continue on same dosage 1 tablet daily except 2 tablets on Mondays.  Recheck in 4 weeks.

## 2011-01-29 NOTE — Letter (Signed)
Summary: Letter from patient regarding transferring to [facility]  Letter from patient regarding transferring to [facility]   Imported By: Maryln Gottron 02/06/2009 13:43:09  _____________________________________________________________________  External Attachment:    Type:   Image     Comment:   External Document

## 2011-01-29 NOTE — Assessment & Plan Note (Signed)
Summary: [facility]   Visit Type:  Follow-up Primary Provider:  Dr. Isaias Cowman  CC:  irregular heart beat.  History of Present Illness: [name] [name] is a pleasant gentleman who has a history of coronary artery disease status post stent to his LAD in December 2003 as well as paroxysmal atrial fibrillation. Last Myoview was in May of 2010. There was extensive soft tissue attenuation but no ischemia or infarction. The calculated ejection fraction was 48% but visually appeared better. Admitted with frequent PVCs in July 2011. Noted to be bradycardic and beta blocker changed to p.r.n. TSH and enzymes were normal. Last echocardiogram in July 2011 showed normal LV function, mild biatrial enlargement and mild right ventricular enlargement. I last saw him in Sept 2011. This morning the patient states he developed recurrent atrial fibrillation at approximately 9:30 AM. He developed palpitations similar to his previous symptoms while having a bowel movement. There is no associated chest pain, shortness of breath or syncope. He therefore contacted the office and we were asked to see as an add on.  Current Medications (verified): 1)  Lipitor 80 Mg Tabs (Atorvastatin Calcium) .[name].. 1 By Mouth Once Daily 2)  Protonix 40 Mg Tbec (Pantoprazole Sodium) .[name].. 1 By Mouth Once Daily 3)  Famotidine 10 Mg Tabs (Famotidine) .... As Needed 4)  Aspirin 325 Mg  Tabs (Aspirin) 5)  Multivitamins   Tabs (Multiple Vitamin) .... Take 1 Tablet By Mouth Once A Day 6)  Nitrolingual 0.4 Mg/spray Soln (Nitroglycerin) .... One Spray Under Tongue Every 5 Minutes As Needed For Chest Pain---May Repeat Times Three 7)  Ambien 10 Mg Tabs (Zolpidem Tartrate) .... As Needed 8)  Fish Oil 1000 Mg Caps (Omega-3 Fatty Acids) .[name].. 1 Tablet in The Morning and 2 Tablets At Night 9)  Atenolol 25 Mg Tabs (Atenolol) .... 1/2 Tab Once Daily As Needed  Allergies: 1)  ! Penicillin 2)  ! Sulfa 3)  ! * Novacaine 4)  ! 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 Cyst on Epiglottis removed 9/03 Surgery on L4-L5 rupture  x 3  98  Stent:  Successful stenting of the mid left anterior descending resulting in no residual stenosis and TIMI-3 flow. 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  We will continue Integrilin for 18 hours. Plavix will be continued for a minimum of three months given the drug-eluting stent.  Aspirin will be continued indefinitely. Salvadore Farber, M.D. [facility] WED/MEDQ  D:  01/18/2003  T:  01/19/2003  Job:  [id number]  Ulnar Neuropathy surgery Hernia umbilical  repari  Social History: Reviewed history from 08/16/2010 and no changes required. 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 Remaining systems are negative.   Vital Signs:  Patient profile:   60 year old male Height:      71.75 inches Weight:      348.25 pounds BMI:     47.73 Pulse rate:  108 / minute Pulse rhythm:   irregular Resp:     20 per minute BP sitting:   114 / 70  (right arm) Cuff size:   large  Vitals Entered By: Vikki Ports (November 14, 2010 11:21 AM)  Physical Exam  General:  Well-developed obese in no acute distress.  Skin is warm and dry.  HEENT is normal.  Neck is supple. No thyromegaly.    Chest is clear to auscultation with normal expansion.  Cardiovascular exam is irregular Abdominal exam nontender or distended. No masses palpated. Extremities show no edema. neuro grossly intact    EKG  Procedure date:  11/14/2010  Findings:      Atrial fibrillation at a rate of 108. No ST changes.  Impression & Recommendations:  Problem # 1:  ATRIAL FIBRILLATION (ICD-427.31) Patient has developed recurrent atrial fibrillation. The duration of his arrhythmia at present is approximately 3 hours. I feel the best treatment option would be to proceed with cardioversion. He did have breakfast but we will try and arrange for the procedure at 2:30 PM today as an outpt. This would be 6 hours after his last meal and 7-8 hours after onset of his atrial fibrillation. I will continue with his aspirin as he has no embolic risk factors. Continue with as needed atenolol. If he has more frequent episodes of atrial fibrillation in the future then we will consider an antiarrhythmic at that time. I will avoid flecainide given his history of coronary disease. His updated medication list for this problem includes:    Aspirin 325 Mg Tabs (Aspirin)    Atenolol 25 Mg Tabs (Atenolol) .[name]... 1/2 tab once daily as needed  His updated medication list for this problem includes:    Aspirin 325 Mg Tabs (Aspirin)    Atenolol 25 Mg Tabs (Atenolol) .[name]... 1/2 tab once daily as needed  Problem # 2:  CORONARY ARTERY DISEASE (ICD-414.00) Continue aspirin and statin. The following medications were removed from the medication list:    Cardizem 60 Mg Tabs (Diltiazem hcl) .[name]... As needed His updated medication list for this problem includes:    Aspirin 325 Mg Tabs (Aspirin)    Nitrolingual 0.4 Mg/spray Soln (Nitroglycerin) ..... One spray under tongue every 5 minutes as needed for chest pain---may repeat times three    Atenolol 25 Mg Tabs (Atenolol) .[name]... 1/2 tab once daily as needed  Problem # 3:  HYPERLIPIDEMIA  (ICD-272.4) Continue statin. His updated medication list for this problem includes:    Lipitor 80 Mg Tabs (Atorvastatin calcium) .[name]... 1 by mouth once daily  Problem # 4:  SLEEP APNEA, OBSTRUCTIVE (ICD-327.23)  Problem # 5:  GERD (ICD-530.81)  His updated medication list for this problem includes:    Protonix 40 Mg Tbec (Pantoprazole sodium) .[name]... 1 by mouth once daily    Famotidine 10 Mg Tabs (Famotidine) .[name]... As needed  Other Orders: Cardioversion (Cardioversion)  Patient Instructions: 1)  Your physician recommends that you schedule a follow-up appointment in: 6 WEEKS 2)  Your physician has recommended that you have a cardioversion (DCCV).  Electrical cardioversion uses a jolt of electricity to your heart either through paddles or wired patches attached to your chest. This is a controlled, usually prescheduled, procedure. Defibrillation is done under light anesthesia in the hospital, and you usually go home the day of the procedure. This is done to get your heart back into a normal rhythm. You are not awake for the procedure. Please see the instruction sheet given to you today.

## 2011-01-29 NOTE — Letter (Signed)
Summary: [facility]  [facility]   Imported By: Maryln Gottron 05/25/2010 15:08:41  _____________________________________________________________________  External Attachment:    Type:   Image     Comment:   External Document

## 2011-01-29 NOTE — Letter (Signed)
[facility]   Imported By: Roderic Ovens 10/03/2010 15:05:46  _____________________________________________________________________  External Attachment:    Type:   Image     Comment:   External Document

## 2011-01-29 NOTE — Assessment & Plan Note (Signed)
Summary: FLU SHOT/CJR  Nurse Visit   Allergies: 1)  ! Penicillin 2)  ! Sulfa 3)  ! * Novacaine 4)  ! * Pseudoephedrine  Review of Systems       Flu Vaccine Consent Questions     Do you have a history of severe allergic reactions to this vaccine? no    Any prior history of allergic reactions to egg and/or gelatin? no    Do you have a sensitivity to the preservative Thimersol? no    Do you have a past history of Guillan-Barre Syndrome? no    Do you currently have an acute febrile illness? no    Have you ever had a severe reaction to latex? no    Vaccine information given and explained to patient? yes    Are you currently pregnant? no    Lot Number:[id number]   Exp Date:06/29/2011   Site Given  Left Deltoid IM    Orders Added: 1)  Admin 1st Vaccine [90471] 2)  Flu Vaccine 51yrs + [16109]

## 2011-01-29 NOTE — Assessment & Plan Note (Signed)
Summary: 4 month rov/njr APPT 9.30AM   Vital Signs:  Patient profile:   60 year old male Height:      71.75 inches Weight:      342 pounds BMI:     46.88 Pulse rate:   60 / minute BP sitting:   130 / 80  (right arm) Cuff size:   large  Vitals Entered By: Romualdo Bolk, CMA (AAMA) (August 16, 2010 9:25 AM) CC: follow-up visit on labs   History of Present Illness: [name] [name] comes in today  for follow up of  bg and liver tests . Since last visit did have hospitalization for  palpiations that showed PAF and was put on as needed atenolol  by cardiology   but hasnt needed it and not used recently  'Now is off coumadin. Had colonoscopy and  polypectomy and no extra bleeding . No fever  resp changes.  Wife has had some medical issues  and thus  eating out a good bit.  Not eating as healthy .  Gi stable on protonix but does get break through.  Preventive Screening-Counseling & Management  Alcohol-Tobacco     Alcohol drinks/day: 0     Smoking Status: quit     Smoking Cessation Counseling: yes     Smoke Cessation Stage: quit     Packs/Day: 2.0     Year Quit: 08/2009  Caffeine-Diet-Exercise     Caffeine use/day: 0     Does Patient Exercise: yes     Type of exercise: walking     Exercise (avg: min/session): <30     Times/week: 3  Current Medications (verified): 1)  Lipitor 80 Mg Tabs (Atorvastatin Calcium) .[name].. 1 By Mouth Once Daily 2)  Protonix 40 Mg Tbec (Pantoprazole Sodium) .[name].. 1 By Mouth Once Daily 3)  Cardizem 60 Mg Tabs (Diltiazem Hcl) .... As Needed 4)  Levitra 20 Mg Tabs (Vardenafil Hcl) .... As Needed 5)  Famotidine 10 Mg Tabs (Famotidine) .[name].. 1 By Mouth Once Daily 6)  Aspirin 325 Mg  Tabs (Aspirin) 7)  Multivitamins   Tabs (Multiple Vitamin) .... Take 1 Tablet By Mouth Once A Day 8)  Nitrolingual 0.4 Mg/spray Soln (Nitroglycerin) .... One Spray Under Tongue Every 5 Minutes As Needed For Chest Pain---May Repeat Times Three 9)  Ambien 10 Mg Tabs (Zolpidem  Tartrate) .... As Needed 10)  Fish Oil 1000 Mg Caps (Omega-3 Fatty Acids) 11)  Atenolol 25 Mg Tabs (Atenolol) .... 1/2 Tab Once Daily As Needed  Allergies (verified): 1)  ! Penicillin 2)  ! Sulfa 3)  ! * Novacaine 4)  ! * Pseudoephedrine  Past History:  Past medical, surgical, family and social histories (including risk factors) reviewed, and no changes noted (except as noted below).  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  We will continue Integrilin for 18 hours. Plavix will be continued for a minimum of three months given the drug-eluting stent.  Aspirin will be continued  indefinitely. Salvadore Farber, M.D. [facility] WED/MEDQ  D:  01/18/2003  T:  01/19/2003  Job:  [id number]  Ulnar Neuropathy surgery Hernia umbilical  repari  Past History:  Care Management: Cardiology: Dr. [name] Surgery: Dr. Ezzard Standing- Allergy: Dr. [place] Callas Pulmonary:  [facility] Hospitalized: July 23rd- July 25th   Family History: Reviewed history from 03/22/2009 and no changes required. Father: Cancer- Lung Mother: Thyroid problem, ovarian cancer Siblings: None  Social History: Reviewed history from 04/25/2010 and no changes required. 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         palpitations better since hospitalizations   . osa   under rx   Physical Exam  General:  Well-developed,well-nourished,in no acute distress; alert,appropriate and cooperative throughout examinationappropriate dress, normal appearance, and healthy-appearing.   Head:  normocephalic and atraumatic.   Eyes:  vision grossly intact.   Neck:  No deformities, masses, or tenderness noted. Lungs:  normal respiratory effort and no intercostal retractions.   Heart:  normal rate, regular rhythm, no murmur, and no gallop.   Abdomen:  Bowel sounds positive,abdomen soft and non-tender without masses, organomegaly or hernias noted.   Msk:  no joint swelling and no joint warmth.   Pulses:  pulses intact without delay   Neurologic:  alert & oriented X3.  appears non focal Skin:  turgor normal, color normal, no ecchymoses, no petechiae, and no purpura.   Cervical Nodes:  No lymphadenopathy noted Psych:  Oriented X3, memory intact for recent and remote, not anxious appearing, and not depressed appearing.     Impression & Recommendations:  Problem # 1:  HYPERGLYCEMIA (ICD-790.29) Assessment Unchanged prediabetic range but no diabetes   .  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   discussion  plts in hopsital werer n119 120 range  but no anemia   120 and 140 in the past  on flow sheet review.   Orders: Hematology Referral (Hematology)  Problem # 3:  LIVER FUNCTION TESTS, ABNORMAL (ICD-794.8) no change  following  prob  fatty liver      in hosp mild elevation of transaminases   alt 62  today.  Problem # 4:  CORONARY ARTERY DISEASE (ICD-414.00) Assessment: Comment Only  His updated medication list for this problem includes:    Cardizem 60 Mg Tabs (Diltiazem hcl) .[name]... As needed    Aspirin 325 Mg Tabs (Aspirin)    Nitrolingual 0.4 Mg/spray Soln (Nitroglycerin) ..... One spray under tongue every 5 minutes as needed for chest pain---may repeat times three    Atenolol 25 Mg Tabs (Atenolol) .[name]... 1/2 tab once daily as needed  Problem # 5:  PALPITATIONS (ICD-785.1) felt to be benign    ? paf. His updated medication list for this problem includes:    Atenolol 25 Mg Tabs (Atenolol) .[name]... 1/2 tab once daily as needed  Complete Medication List: 1)  Lipitor 80 Mg Tabs (Atorvastatin calcium) .[name].. 1 by mouth once daily 2)  Protonix 40 Mg Tbec (Pantoprazole sodium) .Marland KitchenMarland KitchenMarland Kitchen 1  by mouth once daily 3)  Cardizem 60 Mg Tabs (Diltiazem hcl) .... As needed 4)  Levitra 20 Mg Tabs (Vardenafil hcl) .... As needed 5)  Famotidine 10 Mg Tabs (Famotidine) .[name].. 1 by mouth once daily 6)  Aspirin 325 Mg Tabs (Aspirin) 7)  Multivitamins Tabs (Multiple vitamin) .... Take 1 tablet by mouth once a day 8)  Nitrolingual 0.4 Mg/spray Soln (Nitroglycerin) .... One spray under tongue every 5 minutes as needed for chest pain---may repeat times three 9)  Ambien 10 Mg Tabs (Zolpidem tartrate) .... As needed 10)  Fish Oil 1000 Mg Caps (Omega-3 fatty acids) 11)  Atenolol 25 Mg Tabs (Atenolol) .... 1/2 tab once daily as needed  Patient Instructions: 1)   will get   consult about the low platelets   .       2)  weight loss will help   avoid simple sugars    this will   help  the blood sugar and  liver test.    and HDL.  3)  CPX with labs and Hg a1c in  April 2012 greater than 50% of visit spent in counseling   and record review

## 2011-01-29 NOTE — Assessment & Plan Note (Signed)
Summary: Cardiology Nuclear Testing  Nuclear Med Background Indications for Stress Test: Evaluation for Ischemia, Surgical Clearance  Indications Comments: Pending Hernia repair by Dr. Bertram Savin   History: Asthma, Cardioversion, Echo, Heart Catheterization, Myocardial Infarction, Myocardial Perfusion Study, Stents  History Comments: '03 MI>Stent-LAD;'05 WUJ:WJXBJY, EF=62%;'05 Cardioversion for PAF;'08 Echo:EF=55-60%  Symptoms: DOE    Nuclear Pre-Procedure Cardiac Risk Factors: Lipids, Obesity, Smoker Caffeine/Decaff Intake: none NPO After: 7:30 AM Lungs: Clear IV 0.9% NS with Angio Cath: 22g     IV Site: (R) AC IV Started by: Irean Hong RN Chest Size (in) 52     Height (in): 72 Weight (lb): 307 BMI: 41.79 Tech Comments: Patient brought and took valium 5 mg by mouth at 11:45 am today due to 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  Nuclear Med Study 1 or 2 day study:  1 day     Stress Test Type:  Adenosine Reading MD:  Arvilla Meres, MD     Referring MD:  Olga Millers, MD/Amber Freida Busman, MD(Fax [phone] attn: Dionicio Stall) Resting Radionuclide:  Technetium 50m Tetrofosmin     Resting Radionuclide Dose:  7.7 mCi  Stress Radionuclide:  Technetium 60m Tetrofosmin     Stress Radionuclide Dose:  27 mCi   Stress Protocol      Max HR:  49 bpm Max Systolic BP: 121 mm HgRate Pressure Product:  5929 Dose of Adenosine:  60.0 mg    Stress Test Technologist:  Rea [employer] CMA-N     Nuclear Technologist:  Domenic Polite CNMT  Rest Procedure  Myocardial perfusion imaging was performed at rest 45 minutes following the intraveneous administration of Myoview Technetium 73m Tetrofosmin.  Stress Procedure  The patient received IV adenosine at 140 mcg/kg/min for 4 minutes. There were no significant changes with infusion, only occasional PVC's. Myoview was injected at the 2 minute mark and quantitative spect images were obtained after a 45 minute delay.  QPS Raw Data Images:  Extensive  soft-tissue attenuation. Patient motion noted; appropriate software correction applied. Stress Images:  There is normal uptake in all areas. Rest Images:  Normal homogeneous uptake in all areas of the myocardium. Subtraction (SDS):  No evidence of ischemia. Transient Ischemic Dilatation:  1.32  (Normal <1.22)  Lung/Heart Ratio:  .34  (Normal <0.45)  Quantitative Gated Spect Images QGS EDV:  186 ml QGS ESV:  97 ml QGS EF:  48 %  Findings Low risk nuclear study   Evidence for LV Dysfunction LV Dysfunction    Overall Impression  Exercise Capacity: Adenosine study with no exercise. ECG Impression: Baseline: NSR; No significant ST segment change with adenosine. Overall Impression: Low risk stress nuclear study. Overall Impression Comments: Extensive soft-tissue attenuation on raw images. No evidence ischemia or infarct. EF calculates at 48% but visually appears better. Conside echocardiogram.  Appended Document: Cardiology Nuclear Testing ok  Appended Document: Cardiology Nuclear Testing pt aware of results debra mathis rn

## 2011-01-29 NOTE — Letter (Signed)
Summary: Appointment - Reminder 2  [facility], Main Office  [address]   [place], [place] [address]   Phone: [phone]  Fax: [phone]     August 25, 2009 MRN: [id number]   [name] [name] [address] [place], [place]  [address]   Dear [name],  Our records indicate that it is time to schedule a follow-up appointment.  Dr.CRENSHAW recommended that you follow up with [place] in NOV,2010. It is very important that we reach you to schedule this appointment. We look forward to participating in your health care needs. Please contact us at the number listed above at your earliest convenience to schedule your appointment.  If you are unable to make an appointment at this time, give [place] a call so we can update our records.     Sincerely, GESILA,DAVIS  Glass blower/designer

## 2011-01-29 NOTE — Medication Information (Signed)
Summary: rov/jnh  Anticoagulant Therapy  Managed by: Cloyde Reams, RN, BSN Referring MD: Olga Millers MD Supervising MD: Antoine Poche MD, Fayrene Fearing Indication 1: Atrial Fibrillation (ICD-427.31) Lab Used: [facility] Site: [facility] INR POC 2.2 INR RANGE 2 - 3  Dietary changes: no    Health status changes: no    Bleeding/hemorrhagic complications: no    Recent/future hospitalizations: no    Any changes in medication regimen? no    Recent/future dental: no  Any missed doses?: no       Is patient compliant with meds? yes       Current Medications (verified): 1)  Lipitor 80 Mg Tabs (Atorvastatin Calcium) .[name].. 1 By Mouth Once Daily 2)  Warfarin Sodium 5 Mg Tabs (Warfarin Sodium) .... Take As Directed By Coumadin Clinic. 3)  Protonix 40 Mg Tbec (Pantoprazole Sodium) .[name].. 1 By Mouth Once Daily 4)  Cardizem 60 Mg Tabs (Diltiazem Hcl) .[name].. 1 By Mouth Once Daily As Needed 5)  Levitra 20 Mg Tabs (Vardenafil Hcl) 6)  Famotidine 10 Mg Tabs (Famotidine) .[name].. 1 By Mouth Once Daily 7)  Aspirin 81 Mg  Tabs (Aspirin) 8)  Multivitamins   Tabs (Multiple Vitamin) 9)  Nitro-Dur 0.4 Mg/hr Pt24 (Nitroglycerin) 10)  Valium 5 Mg Tabs (Diazepam) .... Bring To Office To Take Prior To Procedure 11)  Zolpidem Tartrate 10 Mg Tabs (Zolpidem Tartrate) .[name].. 1 By Mouth Hs As Needed Sleep  Allergies (verified): 1)  ! Penicillin 2)  ! Sulfa 3)  ! * Novacaine 4)  ! * Pseudoephedrine  Anticoagulation Management History:      The patient is taking warfarin and comes in today for a routine follow up visit.  Negative risk factors for bleeding include an age less than 69 years old.  The bleeding index is 'low risk'.  Negative CHADS2 values include Age > 79 years old.  The start date was 11/09/2004.  Anticoagulation responsible provider: Antoine Poche MD, Fayrene Fearing.  INR POC: 2.2.  Cuvette Lot#: [id number].  Exp: 09/2010.    Anticoagulation Management Assessment/Plan:      The patient's current anticoagulation dose is  Warfarin sodium 5 mg tabs: Take as directed by coumadin clinic..  The target INR is 2 - 3.  The next INR is due 12/05/2009.  Anticoagulation instructions were given to patient.  Results were reviewed/authorized by Cloyde Reams, RN, BSN.  He was notified by Cloyde Reams RN.         Prior Anticoagulation Instructions: INR 1.9  Take 1 1/2 tablets tonight (Tuesday). Then return to normal dosing schedule of 2 tablets on Mondays and 1 tablet on all other days.  Return to clinic in 4 weeks.  Current Anticoagulation Instructions: INR 2.2  Continue on same dosage 1 tablet daily except 2 tablets on Mondays.  Recheck in 4 weeks.

## 2011-01-29 NOTE — Progress Notes (Signed)
Summary: Cardiology - Afib  Phone Note Call from Patient Call back at Home Phone [phone]   Caller: Patient Reason for Call: Talk to Doctor Summary of Call: Returned call from patient with history of PAF, recent DCCV and on rate controlling Atenolol.  Pt is not on coumadin.  Pt states that he had to be cardioverted approx 2 weeks ago and feels he remained in sinus rhythm until Sat evening, around 7 p when he began to feel extra beats.  He states he can feel his heart rate is irregular and it makes him feel strange, possibly anxious.  He knows he is back in Afib with a pulse around 140.  He states he has SOB when he walks but at rest he only feels his irregular heart beats.  Denies CP.  Pt has just taken his atenolol 12.5 mg and he does not feel this is helping.  Pt would like to try something to relieve the feeling of his heart beating too fast and I have told him he can take a second dose of his atenolol (total of 25 mg).  If this does not relieve his symptoms so he can rest then the patient should come to the ER for further treatment and possible cardioversion.  Will leave discussion of antiarrythmics to [facility].  Pt appreciated the call back.   Initial call taken by: Robbi Garter NP-PA,  November 25, 2010 8:36 AM

## 2011-01-29 NOTE — Letter (Signed)
Summary: Handout Printed  Printed Handout:  - Coumadin Instructions 

## 2011-01-29 NOTE — Progress Notes (Signed)
Summary: Cardiology Phone Note - Palpitations  Phone Note Call from Patient   Summary of Call: received call from pt. last night stating that he was having freq. palpitations since 8:30p, while at rest.  no presyncope, s/p, sob.  he is recently s/p dccv and tikosyn loading.  he doesn't think he's back in a.fib as rates are only in 60's and is suspicious that he is having freq. pvc's.  either way, he is very anxious and planned to go into the [facility].  i advised that that would be wise. Initial call taken by: Creig Hines, ANP-BC,  December 01, 2010 5:12 PM

## 2011-01-29 NOTE — Assessment & Plan Note (Signed)
Summary: 6 month rov/njr   Vital Signs:  Patient profile:   60 year old male Weight:      318 pounds Pulse rate:   60 / minute BP sitting:   118 / 72  (left arm) Cuff size:   large  Vitals Entered By: Romualdo Bolk, CMA (AAMA) (October 24, 2009 11:11 AM) CC: Follow-up visit on meds   History of Present Illness: [name] [name]  comes in for fu .  Since last visit  here   he  has had Umbilical hernia surgery without complication. Had a stress test . Also that was ok.  Has had left heel pain May saw Dr Leslee Home .   after having a had pop in heel   with pain .   no major rx .   slowly going away.  OSA  : still on cpap.   sometimes needs help for sleep GERD  : stable  AF: on coumadin    no bleeding or complications  No longer on metoprolol but on cardizem and hr ok.  LIPIDs:  no se of meds . TObacco : quit  has gained some weight is motivated to stay tobacco free.   Preventive Screening-Counseling & Management  Alcohol-Tobacco     Alcohol drinks/day: 0     Smoking Status: quit     Smoking Cessation Counseling: yes     Smoke Cessation Stage: quit     Year Quit: 08/2009  Caffeine-Diet-Exercise     Caffeine use/day: 0     Does Patient Exercise: yes     Type of exercise: walking     Exercise (avg: min/session): <30     Times/week: 3  Current Medications (verified): 1)  Lipitor 80 Mg Tabs (Atorvastatin Calcium) .[name].. 1 By Mouth Once Daily 2)  Warfarin Sodium 5 Mg Tabs (Warfarin Sodium) .... Take As Directed By Coumadin Clinic. 3)  Protonix 40 Mg Tbec (Pantoprazole Sodium) .[name].. 1 By Mouth Once Daily 4)  Cardizem 60 Mg Tabs (Diltiazem Hcl) .[name].. 1 By Mouth Once Daily As Needed 5)  Levitra 20 Mg Tabs (Vardenafil Hcl) 6)  Famotidine 10 Mg Tabs (Famotidine) .[name].. 1 By Mouth Once Daily 7)  Aspirin 81 Mg  Tabs (Aspirin) 8)  Multivitamins   Tabs (Multiple Vitamin) 9)  Nitro-Dur 0.4 Mg/hr Pt24 (Nitroglycerin) 10)  Valium 5 Mg Tabs (Diazepam) .... Bring To Office To Take Prior To  Procedure  Allergies (verified): 1)  ! Penicillin 2)  ! Sulfa 3)  ! * Novacaine 4)  ! * Pseudoephedrine  Past History:  Past Surgical History: Cyst on Epiglottis removed 9/03 Surgery on L4-L5 rupture  x 3  98 Stent Ulnar Neuropathy surgery Hernia umbilical   Past History:  Care Management: Cardiology: Dr. [name] Surgery: Dr. Ezzard Standing- Allergy: Dr.  Callas Pulmonary:  [facility]   Social History: Retired now married  Ex  Smoker recently quit  Alcohol use-no Drug use-no Regular exercise-yes Smoking Status:  quit  Review of Systems  The patient denies anorexia, fever, weight loss, weight gain, vision loss, decreased hearing, chest pain, syncope, dyspnea on exertion, prolonged cough, hemoptysis, abdominal pain, melena, hematochezia, transient blindness, difficulty walking, depression, unusual weight change, abnormal bleeding, enlarged lymph nodes, and angioedema.         left elbow hurts at lateral elbow at tiems  ? tndinitis no numbness or weakness  Physical Exam  General:  Well-developed,well-nourished,in no acute distress; alert,appropriate and cooperative throughout examination Head:  normocephalic and atraumatic.   Eyes:  vision grossly intact.  Mouth:  pharynx pink and moist.   Neck:  No deformities, masses, or tenderness noted. Lungs:  Normal respiratory effort, chest expands symmetrically. Lungs are clear to auscultation, no crackles or wheezes.no dullness.   Heart:  Normal rate and regular rhythm. S1 and S2 normal without gallop, murmur, click, rub or other extra sounds.no lifts.   Abdomen:  Bowel sounds positive,abdomen soft and non-tender without masses, organomegaly or hernias noted. Pulses:  pulses intact without delay   Extremities:  no clubbing cyanosis or trec edema  Neurologic:  alert & oriented X3 and gait normal.   Skin:  turgor normal, color normal, no ecchymoses, and no petechiae.   Cervical Nodes:  No lymphadenopathy noted Psych:  Oriented X3, good  eye contact, not anxious appearing, and not depressed appearing.     Impression & Recommendations:  Problem # 1:  HYPERLIPIDEMIA (ICD-272.4)  His updated medication list for this problem includes:    Lipitor 80 Mg Tabs (Atorvastatin calcium) .[name]... 1 by mouth once daily  Labs Reviewed: SGOT: 27 (03/22/2009)   SGPT: 40 (03/22/2009)   HDL:30.90 (03/22/2009)  LDL:48 (03/22/2009)  Chol:97 (03/22/2009)  Trig:91.0 (03/22/2009)  Problem # 2:  GERD (ICD-530.81)  His updated medication list for this problem includes:    Protonix 40 Mg Tbec (Pantoprazole sodium) .[name]... 1 by mouth once daily    Famotidine 10 Mg Tabs (Famotidine) .[name]... 1 by mouth once daily  Problem # 3:  OBESITY, UNSPECIFIED (ICD-278.00) encouraged weight loss  Problem # 4:  LATERAL EPICONDYLITIS, LEFT (ICD-726.32) left epicondylitis.    disc conservative rx for this  Heel pain same   Problem # 5:  TOBACCO USE, QUIT (ICD-V15.82) congrats and encouraged tobac free state   Problem # 6:  CAD (ICD-414.00)  His updated medication list for this problem includes:    Cardizem 60 Mg Tabs (Diltiazem hcl) .[name]... 1 by mouth once daily as needed    Aspirin 81 Mg Tabs (Aspirin)    Nitro-dur 0.4 Mg/hr Pt24 (Nitroglycerin)  Problem # 7:  SLEEP APNEA, OBSTRUCTIVE (ICD-327.23) Assessment: Comment Only  Problem # 8:  ATRIAL FIBRILLATION (ICD-427.31)  His updated medication list for this problem includes:    Warfarin Sodium 5 Mg Tabs (Warfarin sodium) .[name]... Take as directed by coumadin clinic.    Cardizem 60 Mg Tabs (Diltiazem hcl) .[name]... 1 by mouth once daily as needed    Aspirin 81 Mg Tabs (Aspirin)  Problem # 9:  sleep disturbance  ok to use ambien   Complete Medication List: 1)  Lipitor 80 Mg Tabs (Atorvastatin calcium) .[name].. 1 by mouth once daily 2)  Warfarin Sodium 5 Mg Tabs (Warfarin sodium) .... Take as directed by coumadin clinic. 3)  Protonix 40 Mg Tbec (Pantoprazole sodium) .[name].. 1 by mouth once daily 4)  Cardizem 60 Mg  Tabs (Diltiazem hcl) .[name].. 1 by mouth once daily as needed 5)  Levitra 20 Mg Tabs (Vardenafil hcl) 6)  Famotidine 10 Mg Tabs (Famotidine) .[name].. 1 by mouth once daily 7)  Aspirin 81 Mg Tabs (Aspirin) 8)  Multivitamins Tabs (Multiple vitamin) 9)  Nitro-dur 0.4 Mg/hr Pt24 (Nitroglycerin) 10)  Valium 5 Mg Tabs (Diazepam) .... Bring to office to take prior to procedure 11)  Zolpidem Tartrate 10 Mg Tabs (Zolpidem tartrate) .[name].. 1 by mouth hs as needed sleep  Other Orders: Admin 1st Vaccine (86761) Flu Vaccine 41yrs + (95093)  Patient Instructions: 1)  try ice on  heel and elbow . 2)  try  elbow strap and  change technique  of lifting.   3)  Congrats on stopping  tobacco.  4)  Now decrease calories  to lose weight. 5)  Mediterranean diet .  6)  schedule  cpx   with labs  in April    2011 Prescriptions: ZOLPIDEM TARTRATE 10 MG TABS (ZOLPIDEM TARTRATE) 1 by mouth hs as needed sleep  #30 x 0   Entered and Authorized by:   Madelin Headings MD   Signed by:   Madelin Headings MD on 10/24/2009   Method used:   Print then Give to Patient   RxID:   [id number]     Flu Vaccine Consent Questions     Do you have a history of severe allergic reactions to this vaccine? no    Any prior history of allergic reactions to egg and/or gelatin? no    Do you have a sensitivity to the preservative Thimersol? no    Do you have a past history of Guillan-Barre Syndrome? no    Do you currently have an acute febrile illness? no    Have you ever had a severe reaction to latex? no    Vaccine information given and explained to patient? yes    Are you currently pregnant? no    Lot Number:[id number]   Exp Date:06/28/2010   Site Given  Left Deltoid IMu Romualdo Bolk, CMA (AAMA)  October 24, 2009 11:16 AM

## 2011-01-29 NOTE — Medication Information (Signed)
Summary: [name] [name]  Anticoagulant Therapy  Managed by: Shelby Dubin PharmD, BCPS, CPP Referring MD: Olga Millers MD Supervising MD: Gala Romney MD, Reuel Boom Indication 1: Atrial Fibrillation (ICD-427.31) Lab Used: [facility] Site: [facility] INR POC 1.7 INR RANGE 2 - 3  Dietary changes: no    Health status changes: no    Bleeding/hemorrhagic complications: no    Recent/future hospitalizations: yes       Details: Pt has surgery on 7/26 for a umbilical hernia  Any changes in medication regimen? yes    Recent/future dental: no  Any missed doses?: yes     Details: Pt states yesterday he took 5mg  instead of 10mg   Is patient compliant with meds? yes       Current Medications (verified): 1)  Lipitor 80 Mg Tabs (Atorvastatin Calcium) .[name].. 1 By Mouth Once Daily 2)  Warfarin Sodium 5 Mg Tabs (Warfarin Sodium) .... Take As Directed By Coumadin Clinic. 3)  Protonix 40 Mg Tbec (Pantoprazole Sodium) .[name].. 1 By Mouth Once Daily 4)  Cardizem 60 Mg Tabs (Diltiazem Hcl) .[name].. 1 By Mouth Once Daily As Needed 5)  Levitra 20 Mg Tabs (Vardenafil Hcl) 6)  Famotidine 10 Mg Tabs (Famotidine) .[name].. 1 By Mouth Once Daily 7)  Aspirin 81 Mg  Tabs (Aspirin) 8)  Multivitamins   Tabs (Multiple Vitamin) 9)  Nitro-Dur 0.4 Mg/hr Pt24 (Nitroglycerin) 10)  Valium 5 Mg Tabs (Diazepam) .... Bring To Office To Take Prior To Procedure  Allergies (verified): 1)  ! Penicillin 2)  ! Sulfa 3)  ! * Novacaine 4)  ! * Pseudoephedrine  Anticoagulation Management History:      The patient is taking warfarin and comes in today for a routine follow up visit.  Negative risk factors for bleeding include an age less than 50 years old.  The bleeding index is 'low risk'.  Negative CHADS2 values include Age > 52 years old.  The start date was 11/09/2004.  Anticoagulation responsible provider: Nikki Rusnak MD, Reuel Boom.  INR POC: 1.7.  Cuvette Lot#: [id number].  Exp: 09/2010.    Anticoagulation Management Assessment/Plan:      The  patient's current anticoagulation dose is Warfarin sodium 5 mg tabs: Take as directed by coumadin clinic..  The target INR is 2 - 3.  The next INR is due 09/12/2009.  Anticoagulation instructions were given to patient.  Results were reviewed/authorized by Shelby Dubin PharmD, BCPS, CPP.  He was notified by Windell Moulding.         Prior Anticoagulation Instructions: INR 2.1 Take 2 tabs each Monday and 1 tab on other days.    Current Anticoagulation Instructions: INR 1.7  Take 2 tablets today and 1.5 tablet tomorrow. Then resume on the same dose: 1 tablet daily except 2 tablet on Mondays.    Appended Document: coumadin refill    Prescriptions: WARFARIN SODIUM 5 MG TABS (WARFARIN SODIUM) Take as directed by coumadin clinic.  #45 x 1   Entered by:   Cloyde Reams RN   Authorized by:   Ferman Hamming, MD, [facility]   Signed by:   Cloyde Reams RN on 08/22/2009   Method used:   Electronically to        [facility]  [phone]* (retail)       [address] [place], [place]  [address]       Ph: [phone] or [phone]       Fax: [phone]   RxID:   [id number]

## 2011-01-29 NOTE — Letter (Signed)
Summary: [facility]   Imported By: Maryln Gottron 08/17/2009 10:54:24  _____________________________________________________________________  External Attachment:    Type:   Image     Comment:   External Document

## 2011-01-29 NOTE — Procedures (Signed)
Summary: Colonoscopy Report/[facility]  Colonoscopy Report/[facility]   Imported By: Maryln Gottron 08/16/2010 09:48:41  _____________________________________________________________________  External Attachment:    Type:   Image     Comment:   External Document

## 2011-01-31 NOTE — Assessment & Plan Note (Signed)
Summary: F6W/POST CARDIOVERSION/DM   Visit Type:  Post-hodpital Primary Provider:  Dr. Isaias Cowman   History of Present Illness: [name] [name] is a pleasant gentleman who has a history of coronary artery disease status post stent to his LAD in December 2003 as well as paroxysmal atrial fibrillation. Last Myoview was in May of 2010. There was extensive soft tissue attenuation but no ischemia or infarction. The calculated ejection fraction was 48% but visually appeared better. Admitted with frequent PVCs in July 2011. Noted to be bradycardic and beta blocker changed to p.r.n. TSH and enzymes were normal. Last echocardiogram in July 2011 showed normal LV function, mild biatrial enlargement and mild right ventricular enlargement. Patient had recurrent atrial fibrillation in November of 2011 and had cardioversion. However his symptoms returned and he was admitted and tikosyn was initiated. He had successful cardioversion. He was readmitted for PVCs but improved and was discharged in early December. Since then the patient denies any dyspnea on exertion, orthopnea, PND, pedal edema, palpitations, syncope or chest pain.   Current Medications (verified): 1)  Lipitor 80 Mg Tabs (Atorvastatin Calcium) .[name].. 1 By Mouth Once Daily 2)  Protonix 40 Mg Tbec (Pantoprazole Sodium) .[name].. 1 By Mouth Once Daily 3)  Famotidine 10 Mg Tabs (Famotidine) .... As Needed 4)  Aspirin 325 Mg  Tabs (Aspirin) 5)  Multivitamins   Tabs (Multiple Vitamin) .... Take 1 Tablet By Mouth Once A Day 6)  Nitrolingual 0.4 Mg/spray Soln (Nitroglycerin) .... One Spray Under Tongue Every 5 Minutes As Needed For Chest Pain---May Repeat Times Three 7)  Ambien 10 Mg Tabs (Zolpidem Tartrate) .... As Needed 8)  Fish Oil 1000 Mg Caps (Omega-3 Fatty Acids) .... Take 1 Capsule By Mouth Two Times A Day 9)  Atenolol 25 Mg Tabs (Atenolol) .... 1/2 Tab Once Daily As Needed 10)  Tikosyn 500 Mcg Caps (Dofetilide) .... Take 1 Capsule By Mouth Two Times A Day 11)   Potassium Chloride Crys Cr 20 Meq Cr-Tabs (Potassium Chloride Crys Cr) .... Take One Tablet By Mouth Daily  Allergies: 1)  ! Penicillin 2)  ! Sulfa 3)  ! * Novacaine 4)  ! * Pseudoephedrine  Past History:  Past Medical History: Reviewed history from 11/14/2010 and no changes required. 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 Cyst on Epiglottis removed 9/03 Surgery on L4-L5 rupture  x 3  98  Stent:  Successful stenting of the mid left anterior descending resulting in no residual stenosis and TIMI-3 flow. 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  We will continue Integrilin for 18 hours. Plavix will be continued for a minimum of three months given the drug-eluting stent.  Aspirin will be continued indefinitely. [name] [name], M.D. [facility]MED[facility]  D:  01/18/2003  T:  01/19/2003  Job:  [id number]  Ulnar Neuropathy surgery Hernia umbilical  [place]  Social History: Reviewed history from 08/16/2010 and no changes required. 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 Remaining systems  are negative.   Vital Signs:  Patient profile:   60 year old male Height:      71.75 inches Weight:      351 pounds BMI:     48.11 Pulse rate:   46 / minute Resp:     18 per minute BP sitting:   146 / 74  (left arm) Cuff  size:   large  Vitals Entered By: Vikki Ports (December 25, 2010 12:07 PM)  Physical Exam  General:  Well-developed obese in no acute distress.  Skin is warm and dry.  HEENT is normal.  Neck is supple. No thyromegaly.  Chest is clear to auscultation with normal expansion.  Cardiovascular exam is regular rate and rhythm.  Abdominal exam nontender or distended. No masses palpated. Extremities show no edema. neuro grossly intact    EKG  Procedure date:  12/25/2010  Findings:      Marked sinus bradycardia. No ST changes. Normal QT.  Impression & Recommendations:  Problem # 1:  CORONARY ARTERY DISEASE (ICD-414.00) Continue aspirin and statin. His updated medication list for this problem includes:    Aspirin 325 Mg Tabs (Aspirin)    Nitrolingual 0.4 Mg/spray Soln (Nitroglycerin) ..... One spray under tongue every 5 minutes as needed for chest pain---may repeat times three    Atenolol 25 Mg Tabs (Atenolol) .[name]... 1/2 tab once daily as needed  Problem # 2:  HYPERLIPIDEMIA (ICD-272.4) Continue statin. Lipids and liver monitored by primary care. His updated medication list for this problem includes:    Lipitor 80 Mg Tabs (Atorvastatin calcium) .[name]... 1 by mouth once daily  Problem # 3:  ATRIAL FIBRILLATION (ICD-427.31) Patient remains in sinus rhythm. Continue tikosyn. Check potassium and renal function. Check magnesium. Continue aspirin. Only embolic risk factor is coronary disease. If he has recurrences despite the above we will refer to Dr. Johney Frame for consideration of ablation. His updated medication list for this problem includes:    Aspirin 325 Mg Tabs (Aspirin)    Atenolol 25 Mg Tabs (Atenolol) .[name]... 1/2 tab once daily as needed    Tikosyn 500 Mcg Caps (Dofetilide) .[name]... Take 1 capsule by mouth two times a day  Orders: TLB-BMP (Basic Metabolic Panel-BMET) (80048-METABOL) TLB-Magnesium (Mg) (83735-MG)  Problem # 4:  SLEEP APNEA, OBSTRUCTIVE (ICD-327.23)  Patient  Instructions: 1)  Your physician recommends that you schedule a follow-up appointment in: 3 months

## 2011-01-31 NOTE — Progress Notes (Signed)
Summary: [facility]   Imported By: Sherian Rein 01/14/2011 09:26:40  _____________________________________________________________________  External Attachment:    Type:   Image     Comment:   External Document

## 2011-01-31 NOTE — Assessment & Plan Note (Signed)
Summary: self referral to re-establish for management of osa   Copy to:  Self Referral Primary Provider/Referring Provider:  Dr. Fabian Sharp  CC:  Sleep Consult.  Former pt of Dr. Shelle Iron.  [name]  History of Present Illness: The pt comes in today to re-establish for management of osa.  He has not been seen since 2005, and has stayed on cpap since that time.  He had npsg in 2003 which showed AHI 13/hr during split night study, and was ultimately found to have optimal pressure of 15cm.  He is using his initial cpap machine, and also a full face mask that is aged.  He is wearing cpap compliantly, and denies any breakthru snoring.  He feels he is sleeping well, and denies any significant EDS.  He may have occasional sleep pressure after lunch.  His weight is up about 10-15 pounds over the last few years.    Current Medications (verified): 1)  Lipitor 80 Mg Tabs (Atorvastatin Calcium) .[name].. 1 By Mouth Once Daily 2)  Protonix 40 Mg Tbec (Pantoprazole Sodium) .[name].. 1 By Mouth Once Daily 3)  Famotidine 10 Mg Tabs (Famotidine) .... As Needed 4)  Ecotrin 325 Mg Tbec (Aspirin) .... Take 1 Tablet By Mouth Once A Day 5)  Multivitamins   Tabs (Multiple Vitamin) .... Take 1 Tablet By Mouth Once A Day 6)  Nitrolingual 0.4 Mg/spray Soln (Nitroglycerin) .... One Spray Under Tongue Every 5 Minutes As Needed For Chest Pain---May Repeat Times Three 7)  Ambien 10 Mg Tabs (Zolpidem Tartrate) .... As Needed 8)  Fish Oil 1000 Mg Caps (Omega-3 Fatty Acids) .... Take 1 Capsule By Mouth Two Times A Day 9)  Atenolol 25 Mg Tabs (Atenolol) .... 1/2 Tab Once Daily As Needed 10)  Tikosyn 500 Mcg Caps (Dofetilide) .... Take 1 Capsule By Mouth Two Times A Day 11)  Potassium Chloride Crys Cr 20 Meq Cr-Tabs (Potassium Chloride Crys Cr) .... Take One Tablet By Mouth Daily  Allergies (verified): 1)  ! Penicillin 2)  ! Sulfa 3)  ! * Novacaine 4)  ! 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 Cyst on Epiglottis removed 9/03 Surgery on L4-L5 rupture  x 3  98  Stent:  Successful stenting of the mid left anterior descending resulting in no residual stenosis and TIMI-3 flow. 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  We will continue Integrilin for 18 hours. Plavix will be continued for a minimum of three months given the drug-eluting stent.  Aspirin will be continued indefinitely. Salvadore Farber, M.D. [facility] WED/MEDQ  D:  01/18/2003  T:  01/19/2003  Job:  [id number]  Ulnar Neuropathy surgery Hernia umbilical  repari  Family History: Reviewed history from 03/22/2009 and no changes required. Father: Cancer- Lung Mother: Thyroid problem, ovarian cancer Siblings: None  Social History: Reviewed history from 08/16/2010 and no changes required. 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  started at age 45.  1 to 2 ppd.  quit 2010.  Alcohol use-no Drug use-no Regular exercise-yes has children.  Review of Systems  The patient complains of shortness of breath with activity, irregular heartbeats, and indigestion.  The patient denies shortness of breath at rest, productive cough, non-productive cough, coughing up blood, chest pain, acid heartburn, loss of appetite, weight change, abdominal pain, difficulty swallowing, sore throat, tooth/dental problems, headaches, nasal congestion/difficulty breathing through nose, sneezing, itching, ear ache, anxiety, depression, hand/feet swelling, joint stiffness or  pain, rash, change in color of mucus, and fever.    Vital Signs:  Patient profile:   60 year old male Height:      71.75 inches Weight:      355 pounds BMI:     48.66 O2 Sat:      93 % on Room air Temp:     98.4 degrees F oral Pulse rate:   61 / minute BP sitting:   140 / 76  (left arm) Cuff size:   large  Vitals Entered By: Arman Filter LPN (January 10, 2011 9:41 AM)  O2 Flow:  Room air CC: Sleep Consult.  Former pt of Dr. Shelle Iron.   Comments Medications reviewed with patient Arman Filter LPN  January 10, 2011 9:50 AM    Physical Exam  General:  obese male in nad Eyes:  PERRLA and EOMI.   Nose:  deviation to left with partial obstruction Mouth:  elongation of soft palate and uvula, narrowing posteriorly Neck:  no jvd, tmg, LN Lungs:  clear to auscultation Heart:  sounds regular, no mrg Abdomen:  soft and nontender, bs+ Extremities:  1+ edema bilat, no cyanosis  pulses intact distally Neurologic:  alert, does not appear sleepy, moves all 4.   Impression & Recommendations:  Problem # 1:  SLEEP APNEA, OBSTRUCTIVE (ICD-327.23) the pt is doing fairly well with respect to his osa, and is wearing cpap compliantly.  He will obviously need new supplies, mask, and is due for a new machine.  He feels his current one is working properly, and would like to stick with it.  Will have dme check functioning.  I have stressed to him the importance of weight reduction, and have asked him to followup with me in one year or sooner if having problems.  Medications Added to Medication List This Visit: 1)  Ecotrin 325 Mg Tbec (Aspirin) .... Take 1 tablet by mouth once a day  Other Orders: New Patient Level III (04540) DME Referral (DME)  Patient Instructions: 1)  will send an order to advanced for new supplies, mask, etc. 2)  work on weight loss 3)  followup with me in 12mos

## 2011-03-01 ENCOUNTER — Encounter: Payer: Self-pay | Admitting: Internal Medicine

## 2011-03-01 ENCOUNTER — Ambulatory Visit (INDEPENDENT_AMBULATORY_CARE_PROVIDER_SITE_OTHER): Payer: Self-pay | Admitting: Internal Medicine

## 2011-03-01 VITALS — BP 140/80 | HR 87 | Temp 98.2°F | Wt 346.0 lb

## 2011-03-01 DIAGNOSIS — R062 Wheezing: Secondary | ICD-10-CM

## 2011-03-01 DIAGNOSIS — J40 Bronchitis, not specified as acute or chronic: Secondary | ICD-10-CM | POA: Insufficient documentation

## 2011-03-01 MED ORDER — DOXYCYCLINE HYCLATE 100 MG PO TABS: 100.0000 mg | ORAL_TABLET | Freq: Two times a day (BID) | ORAL | Status: AC

## 2011-03-01 MED ORDER — METHYLPREDNISOLONE ACETATE 80 MG/ML IJ SUSP
80.0000 mg | Freq: Once | INTRAMUSCULAR | Status: AC
  Administered 2011-03-01: 80 mg via INTRAMUSCULAR

## 2011-03-01 MED ORDER — ALBUTEROL 90 MCG/ACT IN AERS: 2.0000 | INHALATION_SPRAY | Freq: Four times a day (QID) | RESPIRATORY_TRACT | Status: DC | PRN

## 2011-03-01 MED ORDER — METHYLPREDNISOLONE (PAK) 4 MG PO TABS: ORAL_TABLET | ORAL | Status: AC

## 2011-03-01 NOTE — Progress Notes (Signed)
  Subjective:    Patient ID: [name] [name], male    DOB: 1951-01-02, 60 y.o.   MRN: [id number]  HPI  Pt presents to clinic for evaluation of cough and wheezing. Notes 11 day h/o NP cough, nasal congestion/drainage and recently mild DOE and intermittent wheezing. No f/c. Taking mucinex DM without improvement. +sick exposure. H/o childhood asthma and has had RAD with infxs trigger. No alleviating or exacerbating factors. No other complaints.  Reviewed PMH, medications and allergies.    Review of Systems  Constitutional: Negative for fever and chills.  HENT: Positive for congestion and rhinorrhea. Negative for ear discharge.   Eyes: Negative for discharge and redness.  Respiratory: Positive for cough, shortness of breath and wheezing.   Skin: Negative for color change, pallor and rash.       Objective:   Physical Exam  Nursing note and vitals reviewed. Constitutional: He appears well-developed and well-nourished. No distress.  HENT:  Head: Normocephalic and atraumatic.  Right Ear: Tympanic membrane, external ear and ear canal normal.  Left Ear: Tympanic membrane, external ear and ear canal normal.  Nose: Nose normal.  Mouth/Throat: Oropharynx is clear and moist. No oropharyngeal exudate.  Eyes: Conjunctivae are normal. Right eye exhibits no discharge. Left eye exhibits no discharge. No scleral icterus.  Neck: Neck supple.  Cardiovascular: Normal rate, regular rhythm and normal heart sounds.   Pulmonary/Chest: Effort normal. No respiratory distress. He has wheezes. He has no rales.       No accessory muscle use  Lymphadenopathy:    He has no cervical adenopathy.  Neurological: He is alert.  Skin: Skin is warm and dry. No rash noted. He is not diaphoretic. No erythema.  Psychiatric: He has a normal mood and affect.          Assessment & Plan:

## 2011-03-01 NOTE — Assessment & Plan Note (Signed)
Begin abx tx. Followup if no improvement or worsening.  

## 2011-03-01 NOTE — Assessment & Plan Note (Signed)
Depomedrol injxn given. Begin medrol dosepak. Albuterol mdi prn. Followup if no improvement or worsening.

## 2011-03-11 LAB — DIFFERENTIAL
Basophils Absolute: 0 10*3/uL (ref 0.0–0.1)
Basophils Relative: 0 % (ref 0–1)
Eosinophils Absolute: 1 10*3/uL — ABNORMAL HIGH (ref 0.0–0.7)
Eosinophils Relative: 12 % — ABNORMAL HIGH (ref 0–5)
Lymphocytes Relative: 31 % (ref 12–46)
Lymphs Abs: 2.7 10*3/uL (ref 0.7–4.0)
Monocytes Absolute: 0.7 10*3/uL (ref 0.1–1.0)
Monocytes Relative: 8 % (ref 3–12)
Neutro Abs: 4.2 10*3/uL (ref 1.7–7.7)
Neutrophils Relative %: 49 % (ref 43–77)

## 2011-03-11 LAB — BASIC METABOLIC PANEL
BUN: 15 mg/dL (ref 6–23)
BUN: 16 mg/dL (ref 6–23)
CO2: 24 mEq/L (ref 19–32)
CO2: 26 mEq/L (ref 19–32)
Calcium: 9.1 mg/dL (ref 8.4–10.5)
Calcium: 9.3 mg/dL (ref 8.4–10.5)
Chloride: 106 mEq/L (ref 96–112)
Chloride: 106 mEq/L (ref 96–112)
Creatinine, Ser: 0.8 mg/dL (ref 0.4–1.5)
Creatinine, Ser: 0.82 mg/dL (ref 0.4–1.5)
GFR calc Af Amer: >60 mL/min (ref >60)
GFR calc Af Amer: >60 mL/min (ref >60)
GFR calc non Af Amer: >60 mL/min (ref >60)
GFR calc non Af Amer: >60 mL/min (ref >60)
Glucose, Bld: 156 mg/dL — ABNORMAL HIGH (ref 70–99)
Glucose, Bld: 205 mg/dL — ABNORMAL HIGH (ref 70–99)
Potassium: 3.7 mEq/L (ref 3.5–5.1)
Potassium: 4.8 mEq/L (ref 3.5–5.1)
Sodium: 137 mEq/L (ref 135–145)
Sodium: 138 mEq/L (ref 135–145)

## 2011-03-11 LAB — URINALYSIS, ROUTINE W REFLEX MICROSCOPIC
Bilirubin Urine: NEGATIVE
Glucose, UA: 100 mg/dL — AB
Hgb urine dipstick: NEGATIVE
Ketones, ur: NEGATIVE mg/dL
Nitrite: NEGATIVE
Protein, ur: NEGATIVE mg/dL
Specific Gravity, Urine: 1.028 (ref 1.005–1.030)
Urobilinogen, UA: 1 mg/dL (ref 0.0–1.0)
pH: 5.5 (ref 5.0–8.0)

## 2011-03-11 LAB — CBC
HCT: 44.1 % (ref 39.0–52.0)
Hemoglobin: 15.7 g/dL (ref 13.0–17.0)
MCH: 31.7 pg (ref 26.0–34.0)
MCHC: 35.6 g/dL (ref 30.0–36.0)
MCV: 89.1 fL (ref 78.0–100.0)
Platelets: 139 10*3/uL — ABNORMAL LOW (ref 150–400)
RBC: 4.95 MIL/uL (ref 4.22–5.81)
RDW: 13.1 % (ref 11.5–15.5)
WBC: 8.6 10*3/uL (ref 4.0–10.5)

## 2011-03-11 LAB — MAGNESIUM
Magnesium: 2.1 mg/dL (ref 1.5–2.5)
Magnesium: 2.2 mg/dL (ref 1.5–2.5)

## 2011-03-11 LAB — POCT CARDIAC MARKERS
CKMB, poc: <1 ng/mL — ABNORMAL LOW (ref 1.0–8.0)
Myoglobin, poc: 50.6 ng/mL (ref 12–200)
Troponin i, poc: <0.05 ng/mL (ref 0.00–0.09)

## 2011-03-12 ENCOUNTER — Other Ambulatory Visit: Payer: Self-pay | Admitting: Hematology and Oncology

## 2011-03-12 ENCOUNTER — Encounter (HOSPITAL_BASED_OUTPATIENT_CLINIC_OR_DEPARTMENT_OTHER): Payer: 59 | Admitting: Hematology and Oncology

## 2011-03-12 DIAGNOSIS — D696 Thrombocytopenia, unspecified: Secondary | ICD-10-CM

## 2011-03-12 DIAGNOSIS — I2510 Atherosclerotic heart disease of native coronary artery without angina pectoris: Secondary | ICD-10-CM

## 2011-03-12 DIAGNOSIS — E7800 Pure hypercholesterolemia, unspecified: Secondary | ICD-10-CM

## 2011-03-12 DIAGNOSIS — I10 Essential (primary) hypertension: Secondary | ICD-10-CM

## 2011-03-12 LAB — URINALYSIS, ROUTINE W REFLEX MICROSCOPIC
Bilirubin Urine: NEGATIVE
Glucose, UA: NEGATIVE mg/dL
Hgb urine dipstick: NEGATIVE
Ketones, ur: NEGATIVE mg/dL
Nitrite: NEGATIVE
Protein, ur: NEGATIVE mg/dL
Specific Gravity, Urine: 1.01 (ref 1.005–1.030)
Urobilinogen, UA: 0.2 mg/dL (ref 0.0–1.0)
pH: 6.5 (ref 5.0–8.0)

## 2011-03-12 LAB — DIFFERENTIAL
Basophils Absolute: 0 10*3/uL (ref 0.0–0.1)
Basophils Relative: 0 % (ref 0–1)
Eosinophils Absolute: 0.8 10*3/uL — ABNORMAL HIGH (ref 0.0–0.7)
Eosinophils Relative: 7 % — ABNORMAL HIGH (ref 0–5)
Lymphocytes Relative: 33 % (ref 12–46)
Lymphs Abs: 3.4 10*3/uL (ref 0.7–4.0)
Monocytes Absolute: 0.8 10*3/uL (ref 0.1–1.0)
Monocytes Relative: 8 % (ref 3–12)
Neutro Abs: 5.4 10*3/uL (ref 1.7–7.7)
Neutrophils Relative %: 52 % (ref 43–77)

## 2011-03-12 LAB — CBC
HCT: 41.7 % (ref 39.0–52.0)
HCT: 42.3 % (ref 39.0–52.0)
HCT: 45.1 % (ref 39.0–52.0)
HCT: 47.4 % (ref 39.0–52.0)
HCT: 47.6 % (ref 39.0–52.0)
Hemoglobin: 14.5 g/dL (ref 13.0–17.0)
Hemoglobin: 14.7 g/dL (ref 13.0–17.0)
Hemoglobin: 15.4 g/dL (ref 13.0–17.0)
Hemoglobin: 16.9 g/dL (ref 13.0–17.0)
Hemoglobin: 17.2 g/dL — ABNORMAL HIGH (ref 13.0–17.0)
MCH: 30.7 pg (ref 26.0–34.0)
MCH: 31 pg (ref 26.0–34.0)
MCH: 31.1 pg (ref 26.0–34.0)
MCH: 31.6 pg (ref 26.0–34.0)
MCH: 32.2 pg (ref 26.0–34.0)
MCHC: 34.1 g/dL (ref 30.0–36.0)
MCHC: 34.8 g/dL (ref 30.0–36.0)
MCHC: 34.8 g/dL (ref 30.0–36.0)
MCHC: 35.5 g/dL (ref 30.0–36.0)
MCHC: 36.3 g/dL — ABNORMAL HIGH (ref 30.0–36.0)
MCV: 88.8 fL (ref 78.0–100.0)
MCV: 89.1 fL (ref 78.0–100.0)
MCV: 89.3 fL (ref 78.0–100.0)
MCV: 89.6 fL (ref 78.0–100.0)
MCV: 90 fL (ref 78.0–100.0)
Platelets: 131 10*3/uL — ABNORMAL LOW (ref 150–400)
Platelets: 134 10*3/uL — ABNORMAL LOW (ref 150–400)
Platelets: 146 10*3/uL — ABNORMAL LOW (ref 150–400)
Platelets: 146 10*3/uL — ABNORMAL LOW (ref 150–400)
Platelets: 147 10*3/uL — ABNORMAL LOW (ref 150–400)
RBC: 4.67 MIL/uL (ref 4.22–5.81)
RBC: 4.72 MIL/uL (ref 4.22–5.81)
RBC: 5.01 MIL/uL (ref 4.22–5.81)
RBC: 5.34 MIL/uL (ref 4.22–5.81)
RBC: 5.34 MIL/uL (ref 4.22–5.81)
RDW: 13 % (ref 11.5–15.5)
RDW: 13 % (ref 11.5–15.5)
RDW: 13.1 % (ref 11.5–15.5)
RDW: 13.1 % (ref 11.5–15.5)
RDW: 13.2 % (ref 11.5–15.5)
WBC: 10.1 10*3/uL (ref 4.0–10.5)
WBC: 10.4 10*3/uL (ref 4.0–10.5)
WBC: 7.3 10*3/uL (ref 4.0–10.5)
WBC: 7.6 10*3/uL (ref 4.0–10.5)
WBC: 9.6 10*3/uL (ref 4.0–10.5)

## 2011-03-12 LAB — CBC WITH DIFFERENTIAL/PLATELET
BASO%: 0.3 % (ref 0.0–2.0)
Basophils Absolute: 0 10*3/uL (ref 0.0–0.1)
EOS%: 5 % (ref 0.0–7.0)
Eosinophils Absolute: 0.5 10*3/uL (ref 0.0–0.5)
HCT: 46.6 % (ref 38.4–49.9)
HGB: 16.3 g/dL (ref 13.0–17.1)
LYMPH%: 28.7 % (ref 14.0–49.0)
MCH: 31.7 pg (ref 27.2–33.4)
MCHC: 34.9 g/dL (ref 32.0–36.0)
MCV: 90.9 fL (ref 79.3–98.0)
MONO#: 0.6 10*3/uL (ref 0.1–0.9)
MONO%: 6.4 % (ref 0.0–14.0)
NEUT#: 5.6 10*3/uL (ref 1.5–6.5)
NEUT%: 59.6 % (ref 39.0–75.0)
Platelets: 148 10*3/uL (ref 140–400)
RBC: 5.13 10*6/uL (ref 4.20–5.82)
RDW: 13.1 % (ref 11.0–14.6)
WBC: 9.4 10*3/uL (ref 4.0–10.3)
lymph#: 2.7 10*3/uL (ref 0.9–3.3)

## 2011-03-12 LAB — BASIC METABOLIC PANEL
BUN: 11 mg/dL (ref 6–23)
BUN: 11 mg/dL (ref 6–23)
BUN: 14 mg/dL (ref 6–23)
BUN: 15 mg/dL (ref 6–23)
BUN: 9 mg/dL (ref 6–23)
BUN: 20 mg/dL (ref 6–23)
CO2: 25 mEq/L (ref 19–32)
CO2: 25 mEq/L (ref 19–32)
CO2: 26 mEq/L (ref 19–32)
CO2: 26 mEq/L (ref 19–32)
CO2: 27 mEq/L (ref 19–32)
CO2: 24 mEq/L (ref 19–32)
Calcium: 8.7 mg/dL (ref 8.4–10.5)
Calcium: 8.8 mg/dL (ref 8.4–10.5)
Calcium: 8.9 mg/dL (ref 8.4–10.5)
Calcium: 9.6 mg/dL (ref 8.4–10.5)
Calcium: 9.8 mg/dL (ref 8.4–10.5)
Calcium: 9.4 mg/dL (ref 8.4–10.5)
Chloride: 104 mEq/L (ref 96–112)
Chloride: 105 mEq/L (ref 96–112)
Chloride: 107 mEq/L (ref 96–112)
Chloride: 107 mEq/L (ref 96–112)
Chloride: 108 mEq/L (ref 96–112)
Chloride: 103 mEq/L (ref 96–112)
Creatinine, Ser: 0.83 mg/dL (ref 0.4–1.5)
Creatinine, Ser: 0.85 mg/dL (ref 0.4–1.5)
Creatinine, Ser: 0.9 mg/dL (ref 0.4–1.5)
Creatinine, Ser: 0.9 mg/dL (ref 0.4–1.5)
Creatinine, Ser: 0.94 mg/dL (ref 0.4–1.5)
Creatinine, Ser: 0.89 mg/dL (ref 0.40–1.50)
GFR calc Af Amer: >60 mL/min (ref >60)
GFR calc Af Amer: >60 mL/min (ref >60)
GFR calc Af Amer: >60 mL/min (ref >60)
GFR calc Af Amer: >60 mL/min (ref >60)
GFR calc Af Amer: >60 mL/min (ref >60)
GFR calc non Af Amer: >60 mL/min (ref >60)
GFR calc non Af Amer: >60 mL/min (ref >60)
GFR calc non Af Amer: >60 mL/min (ref >60)
GFR calc non Af Amer: >60 mL/min (ref >60)
GFR calc non Af Amer: >60 mL/min (ref >60)
Glucose, Bld: 121 mg/dL — ABNORMAL HIGH (ref 70–99)
Glucose, Bld: 124 mg/dL — ABNORMAL HIGH (ref 70–99)
Glucose, Bld: 129 mg/dL — ABNORMAL HIGH (ref 70–99)
Glucose, Bld: 129 mg/dL — ABNORMAL HIGH (ref 70–99)
Glucose, Bld: 161 mg/dL — ABNORMAL HIGH (ref 70–99)
Glucose, Bld: 166 mg/dL — ABNORMAL HIGH (ref 70–99)
Potassium: 3.6 mEq/L (ref 3.5–5.1)
Potassium: 3.9 mEq/L (ref 3.5–5.1)
Potassium: 3.9 mEq/L (ref 3.5–5.1)
Potassium: 4.2 mEq/L (ref 3.5–5.1)
Potassium: 4.4 mEq/L (ref 3.5–5.1)
Potassium: 3.9 mEq/L (ref 3.5–5.3)
Sodium: 137 mEq/L (ref 135–145)
Sodium: 139 mEq/L (ref 135–145)
Sodium: 139 mEq/L (ref 135–145)
Sodium: 139 mEq/L (ref 135–145)
Sodium: 140 mEq/L (ref 135–145)
Sodium: 138 mEq/L (ref 135–145)

## 2011-03-12 LAB — MAGNESIUM
Magnesium: 2 mg/dL (ref 1.5–2.5)
Magnesium: 2.1 mg/dL (ref 1.5–2.5)
Magnesium: 2.2 mg/dL (ref 1.5–2.5)

## 2011-03-12 LAB — APTT: aPTT: 27 seconds (ref 24–37)

## 2011-03-12 LAB — LACTATE DEHYDROGENASE: LDH: 156 U/L (ref 94–250)

## 2011-03-12 LAB — HEPARIN LEVEL (UNFRACTIONATED)
Heparin Unfractionated: 0.27 IU/mL — ABNORMAL LOW (ref 0.30–0.70)
Heparin Unfractionated: 0.27 IU/mL — ABNORMAL LOW (ref 0.30–0.70)
Heparin Unfractionated: 0.28 IU/mL — ABNORMAL LOW (ref 0.30–0.70)
Heparin Unfractionated: 0.43 IU/mL (ref 0.30–0.70)
Heparin Unfractionated: 0.7 IU/mL (ref 0.30–0.70)

## 2011-03-12 LAB — PROTIME-INR
INR: 1 (ref 0.00–1.49)
Prothrombin Time: 13.4 seconds (ref 11.6–15.2)

## 2011-03-12 LAB — POCT CARDIAC MARKERS
CKMB, poc: <1 ng/mL — ABNORMAL LOW (ref 1.0–8.0)
Myoglobin, poc: 55.9 ng/mL (ref 12–200)
Troponin i, poc: <0.05 ng/mL (ref 0.00–0.09)

## 2011-03-15 ENCOUNTER — Other Ambulatory Visit: Payer: Self-pay | Admitting: Cardiology

## 2011-03-15 ENCOUNTER — Encounter: Payer: Self-pay | Admitting: Cardiology

## 2011-03-15 ENCOUNTER — Ambulatory Visit (INDEPENDENT_AMBULATORY_CARE_PROVIDER_SITE_OTHER): Payer: 59 | Admitting: Cardiology

## 2011-03-15 DIAGNOSIS — I4891 Unspecified atrial fibrillation: Secondary | ICD-10-CM

## 2011-03-15 DIAGNOSIS — R002 Palpitations: Secondary | ICD-10-CM

## 2011-03-15 DIAGNOSIS — I2510 Atherosclerotic heart disease of native coronary artery without angina pectoris: Secondary | ICD-10-CM

## 2011-03-15 LAB — BASIC METABOLIC PANEL
BUN: 21 mg/dL (ref 6–23)
CO2: 26 mEq/L (ref 19–32)
Calcium: 9.1 mg/dL (ref 8.4–10.5)
Chloride: 107 mEq/L (ref 96–112)
Creatinine, Ser: 1.1 mg/dL (ref 0.4–1.5)
GFR: 73.01 mL/min (ref >60.00)
Glucose, Bld: 169 mg/dL — ABNORMAL HIGH (ref 70–99)
Potassium: 4 mEq/L (ref 3.5–5.1)
Sodium: 138 mEq/L (ref 135–145)

## 2011-03-15 LAB — MAGNESIUM: Magnesium: 2.2 mg/dL (ref 1.5–2.5)

## 2011-03-16 LAB — TSH: TSH: 0.795 u[IU]/mL (ref 0.350–4.500)

## 2011-03-16 LAB — POCT CARDIAC MARKERS
CKMB, poc: <1 ng/mL — ABNORMAL LOW (ref 1.0–8.0)
CKMB, poc: <1 ng/mL — ABNORMAL LOW (ref 1.0–8.0)
Myoglobin, poc: 53.7 ng/mL (ref 12–200)
Myoglobin, poc: 59.3 ng/mL (ref 12–200)
Troponin i, poc: <0.05 ng/mL (ref 0.00–0.09)
Troponin i, poc: <0.05 ng/mL (ref 0.00–0.09)

## 2011-03-16 LAB — URINALYSIS, ROUTINE W REFLEX MICROSCOPIC
Bilirubin Urine: NEGATIVE
Glucose, UA: NEGATIVE mg/dL
Hgb urine dipstick: NEGATIVE
Ketones, ur: NEGATIVE mg/dL
Nitrite: NEGATIVE
Protein, ur: NEGATIVE mg/dL
Specific Gravity, Urine: 1.02 (ref 1.005–1.030)
Urobilinogen, UA: 1 mg/dL (ref 0.0–1.0)
pH: 5.5 (ref 5.0–8.0)

## 2011-03-16 LAB — CBC
HCT: 41.3 % (ref 39.0–52.0)
HCT: 42.1 % (ref 39.0–52.0)
HCT: 45.2 % (ref 39.0–52.0)
Hemoglobin: 14.3 g/dL (ref 13.0–17.0)
Hemoglobin: 14.4 g/dL (ref 13.0–17.0)
Hemoglobin: 15.8 g/dL (ref 13.0–17.0)
MCH: 31.6 pg (ref 26.0–34.0)
MCH: 32.1 pg (ref 26.0–34.0)
MCH: 32.3 pg (ref 26.0–34.0)
MCHC: 34.3 g/dL (ref 30.0–36.0)
MCHC: 34.6 g/dL (ref 30.0–36.0)
MCHC: 35.1 g/dL (ref 30.0–36.0)
MCV: 92.3 fL (ref 78.0–100.0)
MCV: 92.3 fL (ref 78.0–100.0)
MCV: 92.6 fL (ref 78.0–100.0)
Platelets: 119 10*3/uL — ABNORMAL LOW (ref 150–400)
Platelets: 121 10*3/uL — ABNORMAL LOW (ref 150–400)
Platelets: 122 10*3/uL — ABNORMAL LOW (ref 150–400)
RBC: 4.47 MIL/uL (ref 4.22–5.81)
RBC: 4.56 MIL/uL (ref 4.22–5.81)
RBC: 4.89 MIL/uL (ref 4.22–5.81)
RDW: 13.3 % (ref 11.5–15.5)
RDW: 13.4 % (ref 11.5–15.5)
RDW: 13.5 % (ref 11.5–15.5)
WBC: 7.4 10*3/uL (ref 4.0–10.5)
WBC: 8.5 10*3/uL (ref 4.0–10.5)
WBC: 9 10*3/uL (ref 4.0–10.5)

## 2011-03-16 LAB — DIFFERENTIAL
Basophils Absolute: 0 10*3/uL (ref 0.0–0.1)
Basophils Relative: 0 % (ref 0–1)
Eosinophils Absolute: 0.7 10*3/uL (ref 0.0–0.7)
Eosinophils Relative: 8 % — ABNORMAL HIGH (ref 0–5)
Lymphocytes Relative: 24 % (ref 12–46)
Lymphs Abs: 2.1 10*3/uL (ref 0.7–4.0)
Monocytes Absolute: 0.5 10*3/uL (ref 0.1–1.0)
Monocytes Relative: 6 % (ref 3–12)
Neutro Abs: 5.2 10*3/uL (ref 1.7–7.7)
Neutrophils Relative %: 62 % (ref 43–77)

## 2011-03-16 LAB — BASIC METABOLIC PANEL
BUN: 12 mg/dL (ref 6–23)
CO2: 26 mEq/L (ref 19–32)
Calcium: 8.8 mg/dL (ref 8.4–10.5)
Chloride: 102 mEq/L (ref 96–112)
Creatinine, Ser: 0.82 mg/dL (ref 0.4–1.5)
GFR calc Af Amer: >60 mL/min (ref >60)
GFR calc non Af Amer: >60 mL/min (ref >60)
Glucose, Bld: 134 mg/dL — ABNORMAL HIGH (ref 70–99)
Potassium: 3.7 mEq/L (ref 3.5–5.1)
Sodium: 136 mEq/L (ref 135–145)

## 2011-03-16 LAB — CK TOTAL AND CKMB (NOT AT ARMC)
CK, MB: 1.8 ng/mL (ref 0.3–4.0)
Relative Index: INVALID (ref 0.0–2.5)
Total CK: 86 U/L (ref 7–232)

## 2011-03-16 LAB — TROPONIN I: Troponin I: 0.01 ng/mL (ref 0.00–0.06)

## 2011-03-16 LAB — HEPATIC FUNCTION PANEL
ALT: 54 U/L — ABNORMAL HIGH (ref 0–53)
AST: 31 U/L (ref 0–37)
Albumin: 3.7 g/dL (ref 3.5–5.2)
Alkaline Phosphatase: 65 U/L (ref 39–117)
Bilirubin, Direct: 0.4 mg/dL — ABNORMAL HIGH (ref 0.0–0.3)
Indirect Bilirubin: 0.8 mg/dL (ref 0.3–0.9)
Total Bilirubin: 1.2 mg/dL (ref 0.3–1.2)
Total Protein: 6.6 g/dL (ref 6.0–8.3)

## 2011-03-16 LAB — COMPREHENSIVE METABOLIC PANEL
ALT: 55 U/L — ABNORMAL HIGH (ref 0–53)
AST: 31 U/L (ref 0–37)
Albumin: 3.9 g/dL (ref 3.5–5.2)
Alkaline Phosphatase: 64 U/L (ref 39–117)
BUN: 15 mg/dL (ref 6–23)
CO2: 25 mEq/L (ref 19–32)
Calcium: 9.1 mg/dL (ref 8.4–10.5)
Chloride: 107 mEq/L (ref 96–112)
Creatinine, Ser: 0.81 mg/dL (ref 0.4–1.5)
GFR calc Af Amer: >60 mL/min (ref >60)
GFR calc non Af Amer: >60 mL/min (ref >60)
Glucose, Bld: 117 mg/dL — ABNORMAL HIGH (ref 70–99)
Potassium: 3.9 mEq/L (ref 3.5–5.1)
Sodium: 138 mEq/L (ref 135–145)
Total Bilirubin: 1 mg/dL (ref 0.3–1.2)
Total Protein: 6.7 g/dL (ref 6.0–8.3)

## 2011-03-16 LAB — LIPID PANEL
Cholesterol: 93 mg/dL (ref 0–200)
HDL: 24 mg/dL — ABNORMAL LOW (ref >39)
LDL Cholesterol: 47 mg/dL (ref 0–99)
Total CHOL/HDL Ratio: 3.9 RATIO
Triglycerides: 109 mg/dL (ref <150)
VLDL: 22 mg/dL (ref 0–40)

## 2011-03-16 LAB — PROTIME-INR
INR: 1.06 (ref 0.00–1.49)
Prothrombin Time: 13.7 seconds (ref 11.6–15.2)

## 2011-03-16 LAB — H. PYLORI ANTIBODY, IGG: H Pylori IgG: 0.4 {ISR}

## 2011-03-16 LAB — CARDIAC PANEL(CRET KIN+CKTOT+MB+TROPI)
CK, MB: 1.6 ng/mL (ref 0.3–4.0)
CK, MB: 1.8 ng/mL (ref 0.3–4.0)
Relative Index: 1.7 (ref 0.0–2.5)
Relative Index: INVALID (ref 0.0–2.5)
Total CK: 108 U/L (ref 7–232)
Total CK: 78 U/L (ref 7–232)
Troponin I: 0.01 ng/mL (ref 0.00–0.06)
Troponin I: 0.01 ng/mL (ref 0.00–0.06)

## 2011-03-16 LAB — APTT: aPTT: 27 seconds (ref 24–37)

## 2011-03-16 LAB — HEMOGLOBIN A1C
Hgb A1c MFr Bld: 6 % — ABNORMAL HIGH (ref <5.7)
Mean Plasma Glucose: 126 mg/dL — ABNORMAL HIGH (ref <117)

## 2011-03-16 LAB — MAGNESIUM: Magnesium: 2 mg/dL (ref 1.5–2.5)

## 2011-03-16 LAB — C-REACTIVE PROTEIN: CRP: 0 mg/dL — ABNORMAL LOW (ref <0.6)

## 2011-03-16 LAB — SEDIMENTATION RATE: Sed Rate: 1 mm/hr (ref 0–16)

## 2011-03-16 LAB — BRAIN NATRIURETIC PEPTIDE: Pro B Natriuretic peptide (BNP): 47 pg/mL (ref 0.0–100.0)

## 2011-03-16 LAB — LIPASE, BLOOD: Lipase: 36 U/L (ref 11–59)

## 2011-03-19 NOTE — Assessment & Plan Note (Signed)
Summary: f36m/dm/car   Referring Provider:  Self Referral Primary Provider:  Dr. Fabian Sharp   History of Present Illness: [name] [name] is a pleasant gentleman who has a history of coronary artery disease status post stent to his LAD in December 2003 as well as paroxysmal atrial fibrillation. Last Myoview was in May of 2010. There was extensive soft tissue attenuation but no ischemia or infarction. The calculated ejection fraction was 48% but visually appeared better. Admitted with frequent PVCs in July 2011. Noted to be bradycardic and beta blocker changed to p.r.n. TSH and enzymes were normal. Last echocardiogram in July 2011 showed normal LV function, mild biatrial enlargement and mild right ventricular enlargement. Patient had recurrent atrial fibrillation in November of 2011 and had cardioversion. However his symptoms returned and he was admitted and tikosyn was initiated. He had successful cardioversion. I last saw him in December of 2011. Since then, the patient denies any dyspnea on exertion, orthopnea, PND, pedal edema, syncope or chest pain. He feels an occasional skip but no sustained palpitations.   Current Medications (verified): 1)  Lipitor 80 Mg Tabs (Atorvastatin Calcium) .[name].. 1 By Mouth Once Daily 2)  Protonix 40 Mg Tbec (Pantoprazole Sodium) .[name].. 1 By Mouth Once Daily 3)  Famotidine 10 Mg Tabs (Famotidine) .... As Needed 4)  Ecotrin 325 Mg Tbec (Aspirin) .... Take 1 Tablet By Mouth Once A Day 5)  Multivitamins   Tabs (Multiple Vitamin) .... Take 1 Tablet By Mouth Once A Day 6)  Nitrolingual 0.4 Mg/spray Soln (Nitroglycerin) .... One Spray Under Tongue Every 5 Minutes As Needed For Chest Pain---May Repeat Times Three 7)  Ambien 10 Mg Tabs (Zolpidem Tartrate) .... As Needed 8)  Fish Oil 1000 Mg Caps (Omega-3 Fatty Acids) .... Take 1 Capsule By Mouth Two Times A Day 9)  Tikosyn 500 Mcg Caps (Dofetilide) .... Take 1 Capsule By Mouth Two Times A Day 10)  Potassium Chloride Crys Cr 20 Meq  Cr-Tabs (Potassium Chloride Crys Cr) .... Take One Tablet By Mouth Daily  Allergies (verified): 1)  ! Penicillin 2)  ! Sulfa 3)  ! * Novacaine 4)  ! * Pseudoephedrine  Past History:  Past Medical History: Reviewed history from 01/10/2011 and no changes required. CORONARY ARTERY DISEASE (ICD-414.00) HYPERLIPIDEMIA (ICD-272.4) ATRIAL FIBRILLATION (ICD-427.31) ERECTILE DYSFUNCTION (ICD-302.72) UMBILICAL HERNIA (ICD-553.1) SLEEP APNEA, OBSTRUCTIVE (ICD-327.23)--AHI 13/hr 2003 ULNAR NEUROPATHY, LEFT (ICD-354.2) GERD (ICD-530.81)  Past Surgical History: Reviewed history from 08/16/2010 and no changes required. Cyst on Epiglottis removed 9/03 Surgery on L4-L5 rupture  x 3  98  Stent:  Successful stenting of the mid left anterior descending resulting in no residual stenosis and TIMI-3 flow. 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  We will continue Integrilin for 18 hours. Plavix will be continued for a minimum of three months given the drug-eluting stent.  Aspirin will be continued indefinitely. Salvadore Farber, M.D. [facility] WED/MEDQ  D:  01/18/2003  T:  01/19/2003  Job:  [id number]  Ulnar Neuropathy surgery Hernia umbilical  repari  Social History: Reviewed history from 01/10/2011 and no changes required. 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  started at age 56.  1 to 2 ppd.  quit 2010.  Alcohol use-no Drug use-no Regular exercise-yes has children.  Review of Systems       no fevers or chills, productive  cough, hemoptysis, dysphasia, odynophagia, melena, hematochezia, dysuria, hematuria, rash, seizure activity, orthopnea, PND, pedal edema, claudication. Remaining systems are negative.   Vital Signs:  Patient profile:   60 year old male Weight:      342 pounds Pulse rate:   60 / minute Pulse rhythm:   regular BP sitting:   142  / 83  (left arm) Cuff size:   large  Vitals Entered By: Deliah Goody, RN (March 15, 2011 10:38 AM)  Physical Exam  General:  Well-developed well-nourished in no acute distress.  Skin is warm and dry.  HEENT is normal.  Neck is supple. No thyromegaly.  Chest is clear to auscultation with normal expansion.  Cardiovascular exam is regular rate and rhythm.  Abdominal exam nontender or distended. No masses palpated. Extremities show no edema. neuro grossly intact    EKG  Procedure date:  03/15/2011  Findings:      Sinus rhythm with no ST changes.  Impression & Recommendations:  Problem # 1:  CORONARY ARTERY DISEASE (ICD-414.00) Ctinue aspirin and statin. The following medications were removed from the medication list:    Atenolol 25 Mg Tabs (Atenolol) .[name]... 1/2 tab once daily as needed His updated medication list for this problem includes:    Ecotrin 325 Mg Tbec (Aspirin) .[name]... Take 1 tablet by mouth once a day    Nitrolingual 0.4 Mg/spray Soln (Nitroglycerin) ..... One spray under tongue every 5 minutes as needed for chest pain---may repeat times three  Problem # 2:  HYPERLIPIDEMIA (ICD-272.4) Continue statin. His updated medication list for this problem includes:    Lipitor 80 Mg Tabs (Atorvastatin calcium) .[name]... 1 by mouth once daily  Problem # 3:  ATRIAL FIBRILLATION (ICD-427.31) Patient remains in sinus rhythm. Continue present medications. Check potassium and magnesium. If he has recurrent atrial fibrillation in the future despite tikosyn, will consider referral for ablation. The following medications were removed from the medication list:    Atenolol 25 Mg Tabs (Atenolol) .[name]... 1/2 tab once daily as needed His updated medication list for this problem includes:    Ecotrin 325 Mg Tbec (Aspirin) .[name]... Take 1 tablet by mouth once a day    Tikosyn 500 Mcg Caps (Dofetilide) .[name]... Take 1 capsule by mouth two times a day  Orders: TLB-Magnesium (Mg) (83735-MG)  Problem #  4:  SLEEP APNEA, OBSTRUCTIVE (ICD-327.23)  Other Orders: TLB-BMP (Basic Metabolic Panel-BMET) (80048-METABOL)  Patient Instructions: 1)  Your physician wants you to follow-up in: 6 MONTHS  You will receive a reminder letter in the mail two months in advance. If you don't receive a letter, please call our office to schedule the follow-up appointment.

## 2011-04-01 ENCOUNTER — Other Ambulatory Visit: Payer: Self-pay | Admitting: Internal Medicine

## 2011-04-07 LAB — BASIC METABOLIC PANEL
BUN: 17 mg/dL (ref 6–23)
CO2: 28 mEq/L (ref 19–32)
Calcium: 9.5 mg/dL (ref 8.4–10.5)
Chloride: 107 mEq/L (ref 96–112)
Creatinine, Ser: 0.89 mg/dL (ref 0.4–1.5)
GFR calc Af Amer: >60 mL/min (ref >60)
GFR calc non Af Amer: >60 mL/min (ref >60)
Glucose, Bld: 112 mg/dL — ABNORMAL HIGH (ref 70–99)
Potassium: 4.1 mEq/L (ref 3.5–5.1)
Sodium: 140 mEq/L (ref 135–145)

## 2011-04-07 LAB — CBC
HCT: 44.9 % (ref 39.0–52.0)
Hemoglobin: 15.9 g/dL (ref 13.0–17.0)
MCHC: 35.5 g/dL (ref 30.0–36.0)
MCV: 92 fL (ref 78.0–100.0)
Platelets: 138 10*3/uL — ABNORMAL LOW (ref 150–400)
RBC: 4.88 MIL/uL (ref 4.22–5.81)
RDW: 12.9 % (ref 11.5–15.5)
WBC: 9.6 10*3/uL (ref 4.0–10.5)

## 2011-04-07 LAB — PROTIME-INR
INR: 1.2 (ref 0.00–1.49)
Prothrombin Time: 15.1 seconds (ref 11.6–15.2)

## 2011-04-07 LAB — APTT: aPTT: 28 seconds (ref 24–37)

## 2011-04-22 ENCOUNTER — Other Ambulatory Visit (INDEPENDENT_AMBULATORY_CARE_PROVIDER_SITE_OTHER): Payer: 59 | Admitting: Internal Medicine

## 2011-04-22 DIAGNOSIS — Z0000 Encounter for general adult medical examination without abnormal findings: Secondary | ICD-10-CM

## 2011-04-22 LAB — CBC WITH DIFFERENTIAL/PLATELET
Basophils Absolute: 0 10*3/uL (ref 0.0–0.1)
Basophils Relative: 0.4 % (ref 0.0–3.0)
Eosinophils Absolute: 0.8 10*3/uL — ABNORMAL HIGH (ref 0.0–0.7)
Eosinophils Relative: 9.4 % — ABNORMAL HIGH (ref 0.0–5.0)
HCT: 46.5 % (ref 39.0–52.0)
Hemoglobin: 16.2 g/dL (ref 13.0–17.0)
Lymphocytes Relative: 28.7 % (ref 12.0–46.0)
Lymphs Abs: 2.3 10*3/uL (ref 0.7–4.0)
MCHC: 34.8 g/dL (ref 30.0–36.0)
MCV: 93.2 fl (ref 78.0–100.0)
Monocytes Absolute: 0.6 10*3/uL (ref 0.1–1.0)
Monocytes Relative: 7.8 % (ref 3.0–12.0)
Neutro Abs: 4.3 10*3/uL (ref 1.4–7.7)
Neutrophils Relative %: 53.7 % (ref 43.0–77.0)
Platelets: 127 10*3/uL — ABNORMAL LOW (ref 150.0–400.0)
RBC: 4.99 Mil/uL (ref 4.22–5.81)
RDW: 14 % (ref 11.5–14.6)
WBC: 8.1 10*3/uL (ref 4.5–10.5)

## 2011-04-22 LAB — PSA: PSA: 0.69 ng/mL (ref 0.10–4.00)

## 2011-04-22 LAB — LIPID PANEL
Cholesterol: 120 mg/dL (ref 0–200)
HDL: 33 mg/dL — ABNORMAL LOW (ref >39.00)
LDL Cholesterol: 59 mg/dL (ref 0–99)
Total CHOL/HDL Ratio: 4
Triglycerides: 138 mg/dL (ref 0.0–149.0)
VLDL: 27.6 mg/dL (ref 0.0–40.0)

## 2011-04-22 LAB — POCT URINALYSIS DIPSTICK
Bilirubin, UA: NEGATIVE
Blood, UA: NEGATIVE
Glucose, UA: NEGATIVE
Ketones, UA: NEGATIVE
Leukocytes, UA: NEGATIVE
Nitrite, UA: NEGATIVE
Protein, UA: NEGATIVE
Spec Grav, UA: 1.025
Urobilinogen, UA: 0.2
pH, UA: 5

## 2011-04-22 LAB — HEPATIC FUNCTION PANEL
ALT: 64 U/L — ABNORMAL HIGH (ref 0–53)
AST: 33 U/L (ref 0–37)
Albumin: 4.3 g/dL (ref 3.5–5.2)
Alkaline Phosphatase: 61 U/L (ref 39–117)
Bilirubin, Direct: 0.2 mg/dL (ref 0.0–0.3)
Total Bilirubin: 1.4 mg/dL — ABNORMAL HIGH (ref 0.3–1.2)
Total Protein: 6.9 g/dL (ref 6.0–8.3)

## 2011-04-22 LAB — BASIC METABOLIC PANEL
BUN: 18 mg/dL (ref 6–23)
CO2: 30 mEq/L (ref 19–32)
Calcium: 9.5 mg/dL (ref 8.4–10.5)
Chloride: 98 mEq/L (ref 96–112)
Creatinine, Ser: 0.9 mg/dL (ref 0.4–1.5)
GFR: 93.44 mL/min (ref >60.00)
Glucose, Bld: 119 mg/dL — ABNORMAL HIGH (ref 70–99)
Potassium: 4.5 mEq/L (ref 3.5–5.1)
Sodium: 141 mEq/L (ref 135–145)

## 2011-04-22 LAB — HEMOGLOBIN A1C: Hgb A1c MFr Bld: 6.8 % — ABNORMAL HIGH (ref 4.6–6.5)

## 2011-04-22 LAB — TSH: TSH: 2.28 u[IU]/mL (ref 0.35–5.50)

## 2011-04-25 ENCOUNTER — Encounter: Payer: Self-pay | Admitting: Internal Medicine

## 2011-04-29 ENCOUNTER — Ambulatory Visit (INDEPENDENT_AMBULATORY_CARE_PROVIDER_SITE_OTHER): Payer: 59 | Admitting: Internal Medicine

## 2011-04-29 ENCOUNTER — Encounter: Payer: Self-pay | Admitting: Internal Medicine

## 2011-04-29 VITALS — BP 140/70 | HR 66 | Ht 70.25 in | Wt 343.0 lb

## 2011-04-29 DIAGNOSIS — G4733 Obstructive sleep apnea (adult) (pediatric): Secondary | ICD-10-CM

## 2011-04-29 DIAGNOSIS — E785 Hyperlipidemia, unspecified: Secondary | ICD-10-CM

## 2011-04-29 DIAGNOSIS — K219 Gastro-esophageal reflux disease without esophagitis: Secondary | ICD-10-CM

## 2011-04-29 DIAGNOSIS — D696 Thrombocytopenia, unspecified: Secondary | ICD-10-CM

## 2011-04-29 DIAGNOSIS — E669 Obesity, unspecified: Secondary | ICD-10-CM

## 2011-04-29 DIAGNOSIS — Z86010 Personal history of colonic polyps: Secondary | ICD-10-CM

## 2011-04-29 DIAGNOSIS — I2510 Atherosclerotic heart disease of native coronary artery without angina pectoris: Secondary | ICD-10-CM

## 2011-04-29 DIAGNOSIS — R7309 Other abnormal glucose: Secondary | ICD-10-CM

## 2011-04-29 DIAGNOSIS — R945 Abnormal results of liver function studies: Secondary | ICD-10-CM

## 2011-04-29 DIAGNOSIS — I4891 Unspecified atrial fibrillation: Secondary | ICD-10-CM

## 2011-04-29 DIAGNOSIS — Z0000 Encounter for general adult medical examination without abnormal findings: Secondary | ICD-10-CM

## 2011-04-29 MED ORDER — ATORVASTATIN CALCIUM 80 MG PO TABS: 80.0000 mg | ORAL_TABLET | Freq: Every day | ORAL | Status: DC

## 2011-04-29 MED ORDER — PANTOPRAZOLE SODIUM 40 MG PO TBEC: 40.0000 mg | DELAYED_RELEASE_TABLET | Freq: Every day | ORAL | Status: DC

## 2011-04-29 MED ORDER — ZOLPIDEM TARTRATE 10 MG PO TABS: 10.0000 mg | ORAL_TABLET | Freq: Every evening | ORAL | Status: DC | PRN

## 2011-04-29 NOTE — Progress Notes (Signed)
  Subjective:    Patient ID: [name] [name], male    DOB: 11-04-1951, 60 y.o.   MRN: [id number]  HPI Patient comes in for preventive visit today. Since his last visit he has had hospitalization for rapid A. fib   and was put on Tikosyn.  He has no chest pain or change in his cardiovascular status and is on an every six-month cardiology check.  Eye check was done by Dr Noel Gerold md  UTD on colonoscopy Dr [address]    12 polyps.   on 1 year recall    ? Path. Lipid:  On high dose Lipitor no side effects of medicine. GERD: On generic proton X. He states that his reflux symptoms are a lot better after being treated for wheezy bronchitis in February with an antibiotic and prednisone. Obstructive sleep apnea:   On CPAP seeing specialist Dr. Rosamaria Lints needs a refill on his Ambien which helps him Review of Systems Had asthmatic bronchitis.   IN feb  No change in hearing vision bleeding  joint changes  residual numbness left foot from back problem Surgery . No unusual infections rest as per history of present illness    Objective:   Physical Exam Physical Exam: Vital signs reviewed NWG:NFAO is a well-developed well-nourished alert cooperative  White male  who appears  stated age in no acute distress.  HEENT: normocephalic  traumatic , Eyes: PERRL EOM's full, conjunctiva clear, Nares: patent no deformity discharge or tenderness., Ears: no deformity EAC's clear TMs with normal landmarks. Mouth: clear OP, no lesions, edema.  Moist mucous membranes. Dentition in adequate repair. He wears glasses NECK: supple without masses, thyromegaly or bruits. CHEST/PULM:  Clear to auscultation and percussion breath sounds equal no wheeze , rales or rhonchi. No chest wall deformities or tenderness. CV: PMI is nondisplaced, S1 S2 no gallops, murmurs, rubs. Peripheral pulses are full without delay.No JVD .  ABDOMEN: Bowel sounds normal nontender large abdomen no hernia no fluid wave No guard or rebound, no hepato splenomegal no CVA  tenderness.  No hernia. Extremtities:  No clubbing cyanosis or edema, no acute joint swelling or redness no focal atrophy some chronic skin changes on the legs varicosities. NEURO:  Oriented x3, cranial nerves 3-12 appear to be intact, no obvious focal weakness,gait within normal limits no abnormal reflexes or asymmetrical slight decrease in light touch sensation left foot no ulcers SKIN: No acute rashes normal turgor, color, no bruising or petechiae. PSYCH: Oriented, good eye contact, no obvious depression anxiety, cognition and judgment appear normal. LN:  No cervical axillary or inguinal adenopathy  Laboratory studies reviewed. Notable for slightly low platelets fasting blood sugar of 119. And hemoglobin A1c of 6.8. Minor elevation of transaminase.       Assessment & Plan:  Preventive Health Care UTD OBESITY CAD S/p stent Hx of Rapid AF OSA Low platelets Renal cyst. folllowed by Dr Aldean Ast  Hyperglycemia now a1c in Diabetic range although FBS in not.   Counseled. At length  strategies and  intensify LSI  Lose weight to prevent more problems .   Consider adding meds if not better in 3 months

## 2011-04-29 NOTE — Assessment & Plan Note (Signed)
Under cpap doing   Dr Shelle Iron  Doing well.

## 2011-04-29 NOTE — Patient Instructions (Addendum)
Your blood sugars show early diabetes with a hemoglobin A1c borderline fasting blood sugar.  You need to lose weight decreased simple carbohydrates activity as tolerated. Recheck laboratory studies in 3 months in followup then. We'll send a copy of your lab work to Dr. [name] and Dr. Aldean Ast.  Calorie Counting Diet A calorie counting diet requires you to eat the number of calories that are right for you during a day. Calories are the measurement of how much energy you get from the food you eat. Eating the right amount of calories is important for staying at a healthy weight. If you eat too many calories your body will store them as fat and you may gain weight. If you eat too few calories you may lose weight. Counting the number of calories that you eat during a day will help you to know if you're eating the right amount. A Registered Dietitian can determine how many calories you need in a day. The amount of calories you need varies from person to person. If your goal is to lose weight you will need to eat fewer calories. Losing weight can benefit you if you are overweight or have health problems such as heart disease, high blood pressure or diabetes. If your goal is to gain weight, you will need to eat more calories. Gaining weight may be necessary if you have a certain health problem that causes your body to need more energy. TIPS Whether you are increasing or decreasing the number of calories you eat during a day, it may be hard to get used to changing what you eat and drink. The following are tips to help you keep track of the number of calories you are eating.  Measuring foods at home with measuring cups will help you to know the actual amount of food and number of calories you are eating.   Restaurants serve food in all different portion sizes. It is common that restaurants will serve food in amounts worth 2 or more serving sizes. While eating out, it may be helpful to estimate how many  servings of a food you are given. For example, a serving of cooked rice is 1/2 cup and that is the size of half of a fist. Knowing serving sizes will help you have a better idea of how much food you are eating at restaurants.   Ask for smaller portion sizes or child-size portions at restaurants.   Plan to eat half of a meal at a restaurant and take the rest home or share the other half with a friend   Read food labels for calorie content and serving size   Most packaged food has a Nutrition Facts Panel on its side or back. Here you can find out how many servings are in a package, the size of a serving, and the number of calories each serving has.   The serving size and number of servings per container are listed right below the Nutrition Facts heading. Just below the serving information, the number of calories in each serving is listed.   For example, say that a package has three cookies inside. The Nutrition Facts panel says that one serving is one cookie. Below that, it says that there are three servings in the container. The calories section of the Nutrition Facts says there are 90 calories. That means that there are 90 calories in one cookie. If you eat one cookie you have eaten 90 calories. If you eat all three cookies, you have eaten three  times that amount, or 270 calories.  The list below tells you how big or small some common portion sizes are.  1 ounce (oz).................4 stacked dice.   3 oz.............................Marland KitchenDeck of cards.   1 teaspoon (tsp)..........Marland KitchenTip of little finger.   1 tablespoon (Tbsp).Marland KitchenMarland KitchenMarland KitchenTip of thumb.   2 Tbsp.........................Marland KitchenGolf ball.    Cup.........................Marland KitchenHalf of a fist.   1 Cup..........................Marland KitchenA fist.  KEEP A FOOD LOG Write down every food item that you eat, how much of the food you eat, and the number of calories in each food that you eat during the day. At the end of the day or throughout the day you can add up the  total number of calories you have eaten.  It may help to set up a list like the one below. Find out the calorie information by reading food labels.  Breakfast   Bran Flakes (1 cup, 110 calories).   Fat free milk ( cup, 45 calories).   Snack   Apple (1 medium, 80 calories).   Lunch   Spinach (1 cup, 20 calories).   Tomato ( medium, 20 calories).   Chicken breast strips (3 oz, 165 calories).   Shredded cheddar cheese ( cup, 110 calories).   Light [place] dressing (2 Tbsp, 60 calories).   Whole wheat bread (1 slice, 80 calories).   Tub margarine (1 tsp, 35 calories).   Vegetable soup (1 cup, 160 calories).   Dinner   Pork chop (3 oz, 190 calories).   Brown rice (1 cup, 215 calories).   Steamed broccoli ( cup, 20 calories).   Strawberries (1  cup, 65 calories).   Whipped cream (1 Tbsp, 50 calories).  Daily Calorie Total: 1425 Information from www.eatright.org, Foodwise Nutritional Analysis Database. Document Released: 12/16/2005 Document Re-Released: 01/07/2010 [facility] Patient Information 2011 [place], [place].

## 2011-05-05 ENCOUNTER — Encounter: Payer: Self-pay | Admitting: Internal Medicine

## 2011-05-05 DIAGNOSIS — Z860100 Personal history of colon polyps, unspecified: Secondary | ICD-10-CM | POA: Insufficient documentation

## 2011-05-05 DIAGNOSIS — Z0000 Encounter for general adult medical examination without abnormal findings: Secondary | ICD-10-CM | POA: Insufficient documentation

## 2011-05-14 NOTE — Assessment & Plan Note (Signed)
[facility]                            CARDIOLOGY OFFICE NOTE   [name] [name], [name] [name]                       MRN:          [id number]  DATE:07/02/2007                            DOB:          08/25/1951    [name] is a pleasant gentleman who has previously been followed by  Dr. Andee [name] and Dr. Samule [name].  His cardiac history dates back to December  2003.  At that time, he presented with a non-ST elevation myocardial  infarction.  A catheterization revealed a normal left main, an 80% mid  LAD, and 60% distal lesion.  The 1st diagonal had a 50% stenosis, and  the inferior branch of the obtuse marginal was total.  The right  coronary artery had a 20% lesion.  He had a Cypher stent to the LAD at  that time.  The patient also has a history of atrial fibrillation.  He  initially was treated with direct cardioversion in 2005, and in July  2007 he converted spontaneously with flecainide.  Since he was last  seen, he is doing well.  He does have some dyspnea with more moderate  activities, but not with routine activities.  There is no orthopnea or  PND, but the patient does occasionally have mild pedal edema, which  resolves overnight.  He has not had chest pain, palpitations, or  syncope.  Note, he does continue to smoke.  He is not able to exercise,  as apparently he has injured his leg.   His medications include:  1. Lipitor 80 mg p.o. daily.  2. Multivitamin 1 p.o. daily.  3. Coumadin as directed and followed in the Coumadin Clinic.  4. Aspirin 81 mg p.o. daily.  5. Cardizem as needed.  6. Toprol 50 mg p.o. daily.   PHYSICAL EXAMINATION:  Shows a blood pressure of 141/85.  Pulse is 53.  He weighs 243 pounds.  HEENT:  Normal.  NECK:  Supple with no bruits.  CHEST:  Clear.  CARDIOVASCULAR:  Exam reveals a regular rate and rhythm.  ABDOMINAL EXAM:  Shows an umbilical hernia, but there are no masses  palpated.  EXTREMITIES:  Show trace to 1+ edema  bilaterally.  Electrocardiogram shows a sinus rhythm at a rate of 52.  There is an RV  conduction delay.  There are no significant ST changes.   DIAGNOSES:  1. Coronary artery disease.  The patient is having mild dyspnea, and      it has been quite some time since he had his previous stress test.      We will arrange an adenosine Myoview.  If it shows no ischemia, we      will continue with medical therapy.  He will continue on his      aspirin, statin, and beta blocker at this point.  2. Paroxysmal atrial fibrillation.  The patient remains in sinus      rhythm, and has had no recent palpitations.  He does have embolic      risk factors of hypertension.  We will continue with his Coumadin      with  a goal INR of 2 to 3, and this is being followed in the      Coumadin Clinic.  We will have Dr. Artis [name] forward his most recent      CBC to [place] for our records.  He will continue on Toprol in case his      atrial fibrillation recurs for rate control.  3. Sleep apnea.  Managed per Dr. Shelle [name].  4. Tobacco abuse.  We again discussed the importance of discontinuing      this.  We prescribed Chantix.  Note, he had some nausea previously      with this prescription, but states that at the time he may have      been having an ulcer, and he would like to try it again.  We will      discontinue if his nausea recurs.  5. Obesity.  He needs to lose weight.   We discussed risk factor modification.  I will see him back in  approximately 6 months if his Myoview and echocardiogram are stable.     [name] Frieze [name], MD, [facility]  Electronically Signed    BSC/MedQ  DD: 07/02/2007  DT: 07/02/2007  Job #: [id number]   cc:   [name] [name]. [name] [name], M.D.

## 2011-05-14 NOTE — Assessment & Plan Note (Signed)
[facility]                            CARDIOLOGY OFFICE NOTE   RILEE, WENDLING                       MRN:          [id number]  DATE:11/02/2008                            DOB:          [birth year]    Mr. Yutzy is a pleasant gentleman who has a history of coronary artery  disease status post stent to his LAD in December 2003 as well as  paroxysmal atrial fibrillation.  Since I saw him, he is doing well  symptomatically.  There is no dyspnea, chest pain, palpitations, or  syncope.  There is no pedal edema.   His medications at present include:  1. Lipitor 80 mg p.o. daily.  2. Multivitamin daily.  3. Coumadin as directed.  4. Aspirin 81 mg p.o. daily.  5. Metoprolol 25 mg b.i.d.  6. Protonix 40 mg p.o. daily.   PHYSICAL EXAMINATION:  VITAL SIGNS:  Today shows a blood pressure of  115/70, his pulse is 46.  He weighs 296 pounds.  HEENT:  Normal.  NECK:  Supple.  No bruits.  CHEST:  Clear.  CARDIOVASCULAR:  Bradycardic rate, but a regular rhythm.  ABDOMEN:  No tenderness.  EXTREMITIES:  No edema.   His electrocardiogram shows a marked sinus bradycardia at a rate of 46.  The axis is normal.  There is possible of precordial lead reversal.  There are no ST changes noted.   DIAGNOSES:  1. Paroxysmal atrial fibrillation - the patient remains in sinus      rhythm today.  We will continue with his Lopressor at 25 mg p.o.      b.i.d..  Note, he is somewhat bradycardic, but he does not have any      symptoms from this.  He will continue on Coumadin with a goal INR      of 2-3.  2. Coronary artery disease - he will continue on his aspirin, statin,      and beta-blocker.  He has declined Myoview as he is somewhat      claustrophobic.  He understands the risk of diagnosed coronary      disease.  3. Tobacco abuse - we discussed the importance of discontinuing this      for between 3 to 10 minutes.  4. Sleep apnea.  5. Obesity.  6. Hyperlipidemia -  he will continue on his statin.  His lipids and      liver are being followed by Dr. Artis Flock.  7. Hypertension - his blood pressure is adequately controlled on his      present medications.   I will see him back in 12 months.     Madolyn Frieze [name], MD, [facility]  Electronically Signed    BSC/MedQ  DD: 11/02/2008  DT: 11/03/2008  Job #: [id number]   cc:   Quita Skye. Artis Flock, M.D.

## 2011-05-14 NOTE — Assessment & Plan Note (Signed)
[name]                            CARDIOLOGY OFFICE NOTE   [name], [name]                       MRN:          [id number]  DATE:02/03/2008                            DOB:          [birth year]    [name] [name] is a 60 year old gentleman who has history of coronary  disease status post stent to the LAD in December 2003.  He also has a  history of paroxysmal atrial fibrillation.  I last saw him on July 02, 2007.  Note he did have an echocardiogram performed on July 29, 2007.  His LV function was normal.  There was mild to moderate left atrial  enlargement.  The aortic root was mildly dilated.  We did schedule a  stress test at that time but he declined as he was fearful the adenosine  causing dyspnea.   NOTE:  Over the past several weeks she has had several episodes of  atrial fibrillation.  These typically occurred when he has had increased  reflux symptoms.  He did come see Dr. Excell Seltzer in the office one day and  his Toprol was increased.  This was from 50-75.  His heart rate declined  to the mid 40s and he has backed off to 50 mg p.o. daily now.  He also  with the emergency room with an episode.  He typically has some  palpitations with this but otherwise does not have dyspnea, chest pain  or syncope.  He has resumed his Protonix and feels that his atrial  fibrillation has improved.  Otherwise he has not had any dyspnea on  exertion, orthopnea or exertional chest pain.  His chronic pedal edema.  His medications include Toprol 50 mg p.o. daily Lipitor 20 mg daily,  multivitamin, Coumadin as directed and followed in our Coumadin Clinic,  aspirin 81 mg daily.   PHYSICAL EXAM:  Today shows a blood pressure of 119/73.  His pulse is  50.  Weighs 375 pounds.  HEENT:  Normal.  Neck is supple.  CHEST:  Clear.  Cardiovascular is bradycardic rate with a regular rhythm.  ABDOMEN: Exam shows no tenderness.  EXTREMITIES:  Show 1+ edema and  varicosities.   Electrocardiogram shows sinus rhythm at a rate of 50.  There are no ST  changes.   DIAGNOSIS:  1. Paroxysmal atrial fibrillation - [name] has had recurrent      atrial fibrillation.  He attributes this to increase reflux and now      that he is back on his Protonix they have improved.  We will      continue with his Toprol 50 mg p.o. daily.  I am hesitant to      advance this as his heart rate of 50.  He will continue on Coumadin      with a goal INR 2 to 3 and this is being monitored our Coumadin      Clinic.  If he has additional episodes in the future that more      frequent then we may need to consider an antiarrhythmic.  I would  avoid flecainide given his history of coronary disease.  He would      most likely require Tikosyn.  2. Coronary disease - we will continue with his aspirin, statin and      beta blocker.  I again recommended a Myoview but he has declined      and understands the risk of undiagnosed coronary disease.  3. Sleep apnea.  4. Tobacco abuse - we discussed importance of discontinuing this.  5. Obesity.   He will continue with risk factor modification and we will see him back  in approximately 3 months.     Madolyn Frieze [name], MD, [facility]  Electronically Signed    BSC/MedQ  DD: 02/03/2008  DT: 02/03/2008  Job #: [id number]   cc:   Quita Skye. Artis Flock, M.D.

## 2011-05-14 NOTE — Assessment & Plan Note (Signed)
[facility]                            CARDIOLOGY OFFICE NOTE   [name] [name], [name] [name]                       MRN:          [id number]  DATE:05/13/2008                            DOB:          Aug 19, 1951    HISTORY:  [name] [name] is a pleasant gentleman who has a history of  coronary disease status post stent to the LAD in December 2003.  He also  has paroxysmal atrial fibrillation with normal LV function.  Since I  last saw him, he is doing well.  There is no dyspnea on exertion,  orthopnea, PND, pedal edema, palpitations, presyncope, syncope or chest  pain.   MEDICATIONS:  1. Lipitor 80 mg p.o. daily.  2. Multivitamin daily.  3. Coumadin as directed.  4. Aspirin 81 mg p.o. daily.  5. Lopressor 25 mg p.o. b.i.d.  6. Protonix 40 mg p.o. daily.   PHYSICAL EXAMINATION:  VITAL SIGNS:  Blood pressure 123/76.  His pulse  is 64.  He weighs 326 pounds.  HEENT:  Normal.  NECK:  Supple.  There are no bruits noted.  CHEST:  Clear.  CARDIOVASCULAR:  Reveals a regular rate and rhythm.  ABDOMINAL:  Shows no tenderness.  He does have a ventral hernia.  EXTREMITIES:  Show trace edema.   DIAGNOSES:  1. Paroxysmal atrial fibrillation - [name] [name] has had no recurrent      episodes since I saw him in February.  We will continue with his      Coumadin with a goal INR of 2-3 (he does have a history of      hypertension).  He also has a history of coronary disease status      post myocardial infarction, although his LV function has been      preserved.  He will also continue on Lopressor 25 mg p.o. b.i.d.      for rate control if his atrial fibrillation recurs.  If they become      more frequent in the future, then we will consider Tikosyn.  2. Coronary artery disease - he will continue on his aspirin, statin,      beta blocker.  We will have his most recent lipids and liver      forwarded to [place] for our records.  He has declined a Myoview again.  3. Sleep apnea.  4. Tobacco abuse - we discussed the importance of discontinuing this      (3-10 minutes).  5. Obesity.  6. Hypertension - his blood pressure is adequately controlled.  7. Hyperlipidemia - we will have his lipids and liver forwarded to [place].   FOLLOW UP:  I will see him back in 6 months.     Madolyn Frieze [name], MD, [facility]  Electronically Signed    BSC/MedQ  DD: 05/13/2008  DT: 05/13/2008  Job #: [id number]   cc:   Quita Skye. Artis Flock, M.D.

## 2011-05-14 NOTE — Op Note (Signed)
NAME:  [name], [name] NO.:  [id number]   MEDICAL RECORD NO.:  [id number]          PATIENT TYPE:  OIB   LOCATION:  5128                         FACILITY:  [facility]   PHYSICIAN:  Lennie Muckle, MD      DATE OF BIRTH:  20-Jan-1951   DATE OF PROCEDURE:  07/24/2009  DATE OF DISCHARGE:                               OPERATIVE REPORT   PREOPERATIVE DIAGNOSIS:  Umbilical hernia.   POSTOPERATIVE DIAGNOSIS:  Umbilical hernia.   PROCEDURE:  Laparoscopic umbilical hernia repair.   SURGEON:  Lennie Muckle, MD   ASSISTANT:  OR staff.   FINDINGS:  A 2-cm defect.  A piece of 12-cm Parietex mesh was placed.   COMPLICATIONS:  No immediate complications.   DRAINS:  No drains were placed.   INDICATIONS FOR PROCEDURE:  [name] [name] is a 60 year old male who had  umbilical hernia for approximately 3 years.  He had had an episode of  coughing early in April 2010 and had a protrusion at the umbilical  region.  He had had discomfort with exertional activity.  He desired to  have for repair due to the pain in his umbilical area.   DETAILS OF PROCEDURE:  [name] [name] was identified in the preoperative  holding area.  He received 2 g of Kefzol and was taken to the operating  room.  Once in the operating room, he was placed in the supine position.  After administration of general endotracheal anesthesia, his abdomen was  clipped, prepped, and draped in the usual sterile fashion.  A time-out  procedure indicating the patient and procedure was performed.  SCDs were  applied to the lower extremity.  I attempted to reduce the hernia to the  midline.  I then placed a 5-mm trocar in the left lower quadrant using  the OptiVu.  All layers of abdominal wall were visualized upon entry.  I  inspected the abdomen and found no evidence of injury after insufflating  the abdomen.  I then placed a 5-mm trocar in the left upper quadrant on  visualization with camera.  An 11-mm trocar was placed in the left  lower  quadrant.  I reduced the hernia.  There were adhesions of omentum to the  abdominal wall.  I dissected this with the laparoscopic scissors.  I  then reduced the hernia sac and partially divided this with laparoscopic  scissors.  A small piece was removed from the abdomen and an 11-mm port  site.  After reducing the abdomen, I placed a ruler inside the abdominal  cavity.  I measured the defect to be approximately 2 cm.  I then  measured 5 cm on all sides.  I chose a piece of 12 x 12 cm mesh.  I then  made an incision just above the umbilicus.  I then dissected the hernia  sac with electrocautery.  I cleaned up the fascial edges and closed the  fascial defect with a 1 Novafil suture in an interrupted fashion.  After  covering up the fascial defect, I excised more skin.  I closed the  subcutaneous  tissues with 3-0 Vicryl in a layered fashion and closed the  skin with a 4-0 Monocryl.  I then placed the 12-cm mesh in the abdominal  cavity placing Prolene suture on all 4 sides.  This was secured to the  abdominal wall making sure the center of the mesh was at the middle of  the defect.  I then placed intervening Gore-Tex sutures to fully secure  the mesh.  Gore-Tex was placed on the edges of the mesh in a double ring  fashion.  The mesh appeared to be tight against the abdominal wall.  I  then inspected the abdomen and found no evidence of injury.  I closed  the fascial defect after removing 11-mm trocar in the left lower  quadrant.  I then released the pneumoperitoneum, removed the trocars,  closed the skin at all places with 4-0 Monocryl.  Dermabond was placed  for final dressing.  The patient was then extubated and transferred to  the Postanesthesia Care Unit in stable condition.  He will be monitored  overnight and likely discharged home in a day or two when his pain is  adequately controlled.      Lennie Muckle, MD  Electronically Signed     ALA/MEDQ  D:  07/24/2009  T:   07/24/2009  Job:  [id number]   cc:   Neta Mends. Fabian Sharp, MD  Madolyn Frieze [name], MD, [facility]

## 2011-05-14 NOTE — Discharge Summary (Signed)
NAME:  [name] [name], WEED NO.:  [id number]   MEDICAL RECORD NO.:  [id number]          PATIENT TYPE:  OIB   LOCATION:  5128                         FACILITY:  [facility]   PHYSICIAN:  Lennie Muckle, MD      DATE OF BIRTH:  Oct 23, 1951   DATE OF ADMISSION:  07/24/2009  DATE OF DISCHARGE:  07/26/2009                               DISCHARGE SUMMARY   FINAL DIAGNOSIS:  Umbilical hernia repair.   CONDITION ON DISCHARGE:  Improved.   HOSPITAL COURSE:  [name] is a 60 year old male, who is admitted  following his laparoscopic and partial open umbilical hernia repair on  July 24, 2009.  He was kept a couple of days due to pain issues.  He was  started on Toradol on postoperative day #1.  He had received IV morphine  in addition to p.o. Percocet.  I had restarted him on his Coumadin  postoperative day #1 as well as Zosyn with Lovenox in the interim.  He  has some mild oozing from his lower incision.  The pain has been  adequately controlled by IV Toradol.  He is being discharged with  Percocet and instructed to take ibuprofen 600 or 800 mg q.6 or 8 h.  I  am going to hold his Lovenox today due to some mild oozing from the  lower incision.  He is to restart his Coumadin on Friday, July 30.  Follow up in the Coumadin Clinic next week for dosing, for PT/INR check.  He is instructed to have no heavy lifting over 20 pounds.  He can shower  daily, wash his incisions, and follow up with me in approximately 2 or 3  weeks.      Lennie Muckle, MD  Electronically Signed     ALA/MEDQ  D:  07/26/2009  T:  07/26/2009  Job:  [id number]   cc:   Madolyn Frieze. [name], MD, [facility]  Neta Mends. Fabian Sharp, MD

## 2011-05-17 NOTE — Discharge Summary (Signed)
NAME:  EMANNUEL, VISE NO.:  [id number]   MEDICAL RECORD NO.:  [id number]          PATIENT TYPE:  INP   LOCATION:  6526                         FACILITY:  [facility]   PHYSICIAN:  Salvadore Farber, M.D. LHCDATE OF BIRTH:  02-15-1951   DATE OF ADMISSION:  11/04/2004  DATE OF DISCHARGE:  11/05/2004                                 DISCHARGE SUMMARY   PROCEDURES:  1.  Direct-current cardioversion.  2.  Two-dimensional echocardiogram.   HOSPITAL COURSE:  Mr. Holycross is a 60 year old male with known coronary  artery disease.  He had a non-ST-segment-elevation MI in 2003.  On the day  of admission, he had sudden onset of tachy-palpitations.  He came to the  emergency room and was found to be in atrial fibrillation with rapid  ventricular response, rate 125.  He was admitted for further evaluation and  treatment.  Because of his history of coronary artery disease, an  echocardiogram was obtained.  It showed normal left ventricular function  with an EF of 55% to 65%.  There was mild MR and the left atrium was mildly  dilated at 41 mm.  His PSA was within normal limits at 27 mm.  Cardiac  enzymes were negative for MI.  He had no chest pain.  He had a negative  Cardiolite in February of 2005 and Dr. Salvadore Farber evaluated Mr.  Copes and felt that no further cardiac workup was indicated at this time.   Mr. Evett was started on IV Cardizem for rate control.  He was evaluated by  Dr. Samule Ohm who felt that direct-current cardioversion was the best option  for him.  Dr. Olga Millers performed direct-current cardioversion with a  biphasic defibrillator at 120 joules and converted him to normal sinus  rhythm with 1 shock.  There were no immediate complications.   Mr. Quevedo had a history of pauses with a beta blocker and so p.o. Cardizem  was initiated.  However, because of bradycardia, he was receiving minimal  doses at no more than 60 mg a day.  Dr. Charlton Haws evaluated  Mr. Zeleznik  and felt like he was not tolerating this medication, and it was  discontinued.   By November 06, 2004, Mr. Nehme was ambulating without chest pain or  shortness of breath.  Because of the bradycardia, he was not started on any  rate-lowering medications.  He was started on Coumadin and will be followed  at the Coumadin clinic, but it was felt that full cross-coverage with  Lovenox or heparin was not indicated, since the duration of atrial  fibrillation was so short.  Mr. Mihelich is otherwise stable and will be  discharged on November 06, 2004 with outpatient followup arranged.   DISCHARGE CONDITION:  Stable.   DISCHARGE DIAGNOSES:  1.  Atrial fibrillation, status post direct-current cardioversion, this      admission.  2.  Status post myocardial infarction in December of 2003 with a drug-      eluting stent to the left anterior descending.  3.  Preserved left ventricular function with an ejection fraction  of 55% to      65% by echocardiogram, this admission.  4.  History of premature ventricular contractions.  5.  History of allergic rhinitis.  6.  History of childhood asthma.  7.  Remote history of epiglottitis requiring intubation for airway      obstruction.  8.  History of sinus pauses with beta blockade.  9.  History of a heart rate in the 40s, on Cardizem, receiving 60 mg a day.  10. Status post back surgery x3.  11. Obesity.  12. Ongoing tobacco use.  13. Mild hyperglycemia.  14. Intolerance or allergy to penicillin,  sulfa and Sudafed.  15. Obstructive sleep apnea, on bi-level positive airway pressure.  16. Family history of cancer.   DISCHARGE INSTRUCTIONS:  1.  His activity level is to be as tolerated.  2.  He is to stick to a diet that is low in fat and salt.  3.  He is not to use tobacco.  4.  He is to see the P.A. for Dr. Samule Ohm on November 20, 2004 at 1:30 p.m.      and he is to follow up with Dr. Quita Skye. Kindl as needed.   DISCHARGE MEDICATIONS:   1.  Aspirin 325 mg daily, probable decrease to 81 mg daily once Coumadin is      therapeutic.  2.  Lipitor 80 mg daily.  3.  Multivitamin daily.  4.  Lortab p.r.n.  5.  Sublingual nitroglycerin p.r.n.       RB/MEDQ  D:  11/06/2004  T:  11/06/2004  Job:  [id number]   cc:   Quita Skye. Artis Flock, M.D.  [address]  [place]  [place] [address]  Fax: [phone]

## 2011-05-17 NOTE — H&P (Signed)
NAME:  [name] [name], [name] [name] NO.:  [id number]   MEDICAL RECORD NO.:  [id number]          PATIENT TYPE:  EMS   LOCATION:  MAJO                         FACILITY:  [facility]   PHYSICIAN:  Learta Codding, M.D. LHCDATE OF BIRTH:  1951-10-24   DATE OF ADMISSION:  07/12/2006  DATE OF DISCHARGE:                                HISTORY & PHYSICAL   REASON FOR ADMISSION:  Recurrent atrial fibrillation with rapid ventricular  response.   HISTORY OF PRESENT ILLNESS:  The patient is a 60 year old white male with a  history of known coronary artery disease, status post prior non-ST elevation  myocardial infarction in 2003.  The patient underwent stenting of the LAD at  that time.  He had an admission in 2005 for new onset atrial fibrillation  which occurred in the setting of pseudoephedrine use.  The patient was found  to be in rapid atrial fibrillation and required I.V. Cardizem drip and  cardioversion the next day.  A note was made during this hospital admission,  that the patient had extensive bradycardia on beta blockers.  He also  apparently had bradycardia on low dose Cardizem and a decision was made upon  hospital discharge, not to place him on any rate controlling agents.  The  patient, since that time, had been doing well from an arrhythmia  perspective.  He also has had no recurrence of substernal chest pain.  He  had a Cardiolite in 2005 which showed essentially no ischemia.   The patient states today, that while he was cutting the lawn, it was quite  hot and felt that he was pushing himself.  He got somewhat short of breath,  but denied any substernal chest pain.  He then suddenly felt the onset of  palpitations and he felt that he was in atrial fibrillation.  He then  summoned the paramedics and indeed, atrial fibrillation with rapid  ventricular response was confirmed.  He was given 20 mg of I.V. Cardizem in  the field and then on arrival to the emergency room, he received  an  additional 20 mg followed by Cardizem drip of 20 mg an hour.  The patient's  rate is now somewhat controlled at 100 to 105 beats per minute.  His initial  heart rate was 150 beats per minute upon arrival to the emergency room.  There were no acute ischemic changes on the electrocardiogram.  He also  denied substernal chest pain.  Initial troponins were less than 0.05.  This  was at the point of care.  His laboratory work also showed normal  electrolytes.   ALLERGIES:  MULTIPLE INCLUDING PENICILLIN, SULFA AND BETA BLOCKERS CAUSING  BRADYCARDIA AS OUTLINED ABOVE.  PSEUDOEPHEDRINE CAUSING PALPITATIONS AND  ATRIAL FIBRILLATION.   MEDICATIONS:  1.  Aspirin 81 mg a day.  2.  Lipitor 80 mg p.o. q. h.s.  3.  Coumadin 5 mg q.d. with 10 mg on Monday, Wednesday, and Friday.   SOCIAL HISTORY:  The patient lives in [place].  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  He has been divorced.  He  has several children.  He continues  to smoke, unfortunately, two packs a day.  He is interested in getting a  prescription for Chantix.   FAMILY HISTORY:  Mother died at age 3 of ovarian cancer without known  __________  The patient's father died at age 22.  He had tobacco use and  lung cancer.  The patient has no siblings.   PAST MEDICAL HISTORY:  See problem list below.  1.  Atrial fibrillation, status post direct cardioversion.  2.  History of coronary artery disease, status post LAD stent in 2003.  3.  Residual diagonal disease.  4.  Negative Cardiolite in February 2005.  5.  History of PVCs and palpitations with pseudoephedrine.  6.  Morbid obesity with sleep apnea.  7.  History of childhood asthma.  8.  Epiglottitis with airway obstruction and intubation, status post cyst      removal.  9.  History of back surgery.  10. History of dyslipidemia.  11. Ongoing tobacco use.   REVIEW OF SYSTEMS:  As per HPI.  No nausea or vomiting.  No fever or chills.  Dyspnea on exertion which is chronic.   No melena, hematochezia, no dysuria  or frequency.  No syncope.  No chest pain.   PHYSICAL EXAMINATION:  VITAL SIGNS:  Blood pressure is 103/68 with a heart  rate of 103 beats per minute.  Respirations are 22.  GENERAL:  Obese, white male in no apparent distress.  HEENT:  Pupils reactive to light.  NECK:  Supple.  No JVD.  No carotid bruits.  LUNGS:  Diminished breath sounds bilaterally.  HEART:  Irregular rate and rhythm with normal S1 and S2.  Distant heart  sounds, but no pathological murmurs.  ABDOMEN:  Soft and nontender.  No rebound or guarding.  Good bowel sounds.  EXTREMITIES:  Exam reveals 1+ peripheral pitting edema, but no cyanosis or  clubbing.  The dorsalis pedis and posterior tibial pulse are 1+ on the right  side and 2+ on the left side.  Femoral pulses are intact bilaterally with no  bruits.  NEURO:  The patient is alert, oriented and grossly nonfocal.   LABORATORY WORK:  White count is 9.5, hemoglobin 16.6, hematocrit 48.4,  platelet count 170.  INR is 2.0.  Myoglobin is 46.9.  Troponin less than  0.05.  Sodium 139, potassium 4.2, chloride 107, BUN 14, glucose 119.   Twelve lead echocardiogram demonstrates atrial fibrillation with rapid  ventricular response, but no definite acute ischemic ST segment changes.  Heart rate on that tracing is 134 beats per minute.   Chest x-ray is pending.   PROBLEM LIST:  1.  Atrial fibrillation with rapid ventricular response, new onset.  2.  History of coronary artery disease, stable with no recurrent chest pain.  3.  History of obstructive sleep apnea on CPAP.  4.  History of abnormal left ventricular function with an ejection fraction      of 60 to 65%.   PLAN:  1.  The patient has recurrent onset of atrial fibrillation.  His heart rate      is controlled on I.V. Cardizem.  The plan is to continue I.V. Cardizem     drip and give the patient p.o. flecainide for acute chemical      cardioversion.  2.  Electrolytes have been  closely reviewed and there is no specific      contraindications for an acute dose of p.o. flecainide.  3.  The plan is to  monitor the patient for at least 6 to 8 hours after his      p.o. flecainide and leave him on the      Cardizem drip and likely change him over to p.o. Cardizem later tonight      once he is converted into normal sinus rhythm.  The drip can then be      weaned.  The plan is to discharge him in the morning with follow up with      Dr. __________ .      Learta Codding, M.D. [facility]  Electronically Signed     GED/MEDQ  D:  07/12/2006  T:  07/12/2006  Job:  [id number]   cc:   Dr. Penni Bombard  [address]  [place], [place]   Dr. Reuel Boom  PRIMARY CARDIOLOGIST

## 2011-05-17 NOTE — Assessment & Plan Note (Signed)
[facility]                              CARDIOLOGY OFFICE NOTE   ETHRIDGE, SOLLENBERGER                         MRN:          [id number]  DATE:10/15/2006                            DOB:          05/12/1951    HISTORY OF PRESENT ILLNESS:  [name] is a 60 year old gentleman with  atherosclerotic coronary artery disease, status post non-ST elevation  myocardial infarction in 2003, with drug-eluting stenting of the mid LAD.  Ejection fraction is normal.   He also has paroxysmal atrial fibrillation and was hospitalized in July with  atrial fibrillation.  He was cardioverted with flecainide by Dr. Andee Lineman.  He  has remained in sinus rhythm thereafter.  He has not had any chest pain,  shortness of breath, recurrent palpitations, claudication, PND, orthopnea or  edema.   CURRENT MEDICATIONS:  1. Lipitor 80 mg per day.  2. Multivitamin.  3. Coumadin.  4. Enteric-coated aspirin 81 mg per day.  5. Protonix 40 mg per day.  6. He has Cardizem 60 mg to use on a p.r.n. basis for atrial fibrillation.   He has not been on a beta blocker due to bradycardia noted when off CPAP in  the hospital.  Heart rate was in the low 30s.  However, the patient says he  is 100% compliant with CPAP when at home.  He has not had any syncope or  presyncope during the day.   PHYSICAL EXAMINATION:  He is obese, in no distress.  Heart rate 69, blood pressure 128/76, weight of 331 pounds.  Weight is  stable.  He has no jugular venous distention and no thyromegaly.  LUNGS:  Clear to auscultation.  He has a nondisplaced point of maximal cardiac impulse.  There is a regular  rate and rhythm without murmur, rub or gallop.  ABDOMEN:  Soft, nondistended and nontender, no hepatosplenomegaly.  Bowel  sounds are normal.  EXTREMITIES:  Warm without clubbing, cyanosis, edema or ulceration.   Electrocardiogram demonstrates normal sinus rhythm with sinus arrhythmia  that is normal.   IMPRESSION/PLAN:  1. Paroxysmal atrial fibrillation:  No recurrence since July.  Continue      anticoagulation.  He is tolerating the Coumadin without bleeding.      Followed in our Coumadin clinic.  We will begin a beta blocker, as      would not expect any bradycardia with him using his CPAP.  2. Coronary disease:  Prior anterior myocardial infarction.  Continue      aspirin and initiate beta blocker.  With him on Coumadin, will not      continue the Plavix.  3. Sleep apnea:  CPAP managed by Dr. Shelle Iron.  4. Tobacco abuse:  Continues to smoke 2 packs per day.  Interested in      quitting.  Tried Chantix but had to stop due to severe nausea and      epigastric pain.  Willing to try Wellbutrin.  I have prescribed this      today.  Will follow up in 6 weeks for encouragement.  Will provide him  with number for [facility].       Salvadore Farber, MD     WED/MedQ  DD:  10/15/2006  DT:  10/17/2006  Job #:  [id number]

## 2011-05-17 NOTE — H&P (Signed)
NAME:  [name] [name], [name] [name] NO.:  [id number]   MEDICAL RECORD NO.:  [id number]                   PATIENT TYPE:  EMS   LOCATION:  MINO                                 FACILITY:  [facility]   PHYSICIAN:  Pricilla Riffle, M.D.                 DATE OF BIRTH:  08/06/1951   DATE OF ADMISSION:  02/27/2004  DATE OF DISCHARGE:                                HISTORY & PHYSICAL   CHIEF COMPLAINT:  The patient is a 60 year old gentleman with coronary  artery disease who comes in for an evaluation of palpitations.   HISTORY OF PRESENT ILLNESS:  The patient's history of coronary artery  disease dates back to December 2003.  He suffered a non-Q-wave myocardial  infarction and had a stent to the LAD done at that time.  He was seen once  after this and then has not had followup since.  The patient denies any  chest pain.  Does activities as tolerated, though no organized activity.  Last week he took a nasal decongestant (Allegra-D), and noticed some skips.  Since that time he has noted occasional palpitations.  He took the Allegra-D  since then.  On Sunday he was talking on the phone and again noticed some  palpitations with some associated dizziness.  Today at work he said he felt  somewhat dizzy and someone at work said he did not look well.  Again he  denies chest pain.  No shortness of breath.   ALLERGIES:  PENICILLIN, SULFA AND NOVOCAIN.   MEDICATIONS:  1. Aspirin.  2. Lipitor 80 mg q.h.s.  3. Nitroglycerin - has not taken.  4. Multivitamins.  5. Allegra-D.   PAST MEDICAL HISTORY:  1. Coronary artery disease.  2. Sinus problems.  3. History of a tracheal cyst, status post removal  in the fall of 2004.  4. Tobacco use.  Continues to smoke 1-1/2 packs a day.  5. History of bradycardia, response to beta blockers.  6. Sleep apnea.  Has not received therapy yet.  7. Obesity.   SOCIAL HISTORY:  The patient is divorced.  He has two children.  He works at  [employer].  Smokes  1-1/2 packs per day for the past 30 years.  He quit  transiently.  Rare ETOH.   FAMILY HISTORY:  Significant for cancer.  Mother and father died.   REVIEW OF SYSTEMS:  Clear sinus drainage.  The patient notes recent sinus  problems.  Otherwise all systems are negative except as noted.   PHYSICAL EXAMINATION:  GENERAL:  The patient is in no acute distress.  VITAL SIGNS:  Blood pressure 129/81, pulse 71, temperature 97.6 degrees.  NECK:  No bruits.  Unable to assess the JVP.  LUNGS:  Occasional wheeze in the left base, otherwise clear.  HEART:  A regular rate and rhythm.  S1, S2.  No S3 or S4.  No significant  murmurs.  ABDOMEN:  Obese, benign.  No hepatomegaly.  EXTREMITIES:  Good distal pulses.  No edema.  A 12-lead electrocardiogram:  Normal sinus rhythm at a rate of 66 beats per  minute.  Telemetry with occasional premature ventricular contractions.   IMPRESSION:  The patient is a 60 year old gentleman with a history of  coronary artery disease, who continues to smoke.  He has not had followup in  one year.  He is not that active.  Recent use of decongestants with  premature ventricular contractions.  Note:  The patient is somewhat dizzy.   PLAN:  Will admit and observe on telemetry.  Rule out a myocardial  infarction, though I doubt that would be positive.  Would go ahead and  schedule the patient for a stress Cardiolite, to evaluate for inducible  ischemia.  Again, given that his myocardial infarction presentation was a  sudden onset of chest pain, no prodrome of shortness of breath or chest  pain.  Consult on smoking cessation.  Will also have counseling in the hospital.  A  fasting lipid panel tomorrow.  Consider admission to a rehab program, to get  him exercising.                                                Pricilla Riffle, M.D.    PVR/MEDQ  D:  02/27/2004  T:  02/27/2004  Job:  [id number]

## 2011-05-17 NOTE — H&P (Signed)
NAME:  [name] [name], [name] [name] NO.:  [id number]   MEDICAL RECORD NO.:  [id number]                   PATIENT TYPE:  INP   LOCATION:  1832                                 FACILITY:  [facility]   PHYSICIAN:  Charlies Constable, M.D. [facility]              DATE OF BIRTH:  [birth year]   DATE OF ADMISSION:  12/07/2002  DATE OF DISCHARGE:                                HISTORY & PHYSICAL   PRIMARY CARE PHYSICIAN:  Dr. Quita Skye. Kindl.   CHIEF COMPLAINT:  Chest pain.   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  He had had the onset of chest pain  early this morning which he described as a substernal pressure with some  burning.  He took an antacid without relief and then took four aspirin.  His  pain persisted, so he called EMS and came to [facility] Room by EMS.  He  was pain-free on arrival.  He had no associated diaphoresis, nausea or  shortness of breath.  Dr. Sheppard Penton. Mayer admitted him for observation and  his first troponin returned positive at 0.22.   PAST MEDICAL HISTORY:  His past medical history is significant for a  previous back surgery by Dr. Hewitt Shorts.  This summer, he developed  severe epiglottitis requiring transient intubation for airway protection.  There is no history of diabetes or hypertension or hyperlipidemia.   CURRENT MEDICATIONS:  His current medications include aspirin and Allegra.   SOCIAL HISTORY:  He is divorced and has two children, one who is in school  at [employer].  He smokes one and a half packs of cigarettes a day.   FAMILY HISTORY:  Both his parents died of cancer and there is no family  history of heart disease.   REVIEW OF SYSTEMS:  Review of systems is negative.   PHYSICAL EXAMINATION:  GENERAL:  He was in no distress and had no pain.  VITAL SIGNS:  His blood pressure was 117/68 and the pulse 68 and regular.  NECK:  There was no venous distention.  The  carotid pulses were full and  there were no bruits.  CHEST:  The chest was clear without rales or rhonchi.  CARDIAC:  Cardiac rhythm was regular.  The first and second heart sounds  were normal and there were no murmurs or gallops.  ABDOMEN:  The abdomen was soft with normal bowel sounds.  There was no  hepatosplenomegaly or pulsatile masses.  EXTREMITIES:  The peripheral pulses were full and there is no peripheral  edema.  MUSCULOSKELETAL:  Musculoskeletal system showed no deformities.  SKIN:  The skin was warm and dry.  NEUROLOGIC:  Examination showed no focal signs.   LABORATORY AND ACCESSORY CLINICAL DATA:  His EKG showed left axis deviation  and borderline Q waves in the inferior leads, indicative of  possible old  diaphragmatic wall infarction.  There were some slight ST-T changes in leads  V1 and V2.   IMPRESSION:  1. Non-ST-segment-elevation myocardial infarction.  2. Current cigarette smoker.   RECOMMENDATIONS:  The patient has already received aspirin and just receive  Lovenox.  We will start him on low-dose beta blocker.  We will plan  catheterization later today.  I discussed this with Dr. Salvadore Farber  and he will probably catheterize him early while his Lovenox is still in  effect.                                               Charlies Constable, M.D. [facility]    BB/MEDQ  D:  12/07/2002  T:  12/07/2002  Job:  [id number]   cc:   Quita Skye. Artis Flock, M.D.  [address]  [place]  [place] [address]  Fax: [phone]   Sheppard Penton. Stacie Acres, M.D.  [address] [place]  [place] [address]  Fax: [phone]

## 2011-05-17 NOTE — Discharge Summary (Signed)
NAME:  [name] [name], [name] [name] NO.:  [id number]   MEDICAL RECORD NO.:  [id number]          PATIENT TYPE:  INP   LOCATION:  2039                         FACILITY:  [facility]   PHYSICIAN:  Learta Codding, M.D. LHCDATE OF BIRTH:  May 18, 1951   DATE OF ADMISSION:  07/12/2006  DATE OF DISCHARGE:  07/13/2006                                 DISCHARGE SUMMARY   PROCEDURES:  None.   PRIMARY DIAGNOSIS:  Paroxysmal atrial fibrillation.   SECONDARY DIAGNOSIS:  1.  Chronic anticoagulation with Coumadin.  2.  Anterior wall myocardial infarction in 2003 with Cypher stent to the      left anterior descending, obtuse marginal-2 chronically occluded.  3.  Status post direct cardioversion in 2005 for paroxysmal atrial      fibrillation.  4.  Removal of epiglottis mucous retention cyst.  5.  Tobacco use.  6.  History of bradycardia secondary to beta-blockers.  7.  Obstructive sleep apnea on CPAP.  8.  Obesity.  9.  Allergy or intolerance to PENICILLIN, SULFA and NOVOCAIN.  10. Hyperlipidemia.  11. Status post Cardiolite in 2005 showing no ischemia and an ejection      fraction of 62%.   TIME AT DISCHARGE:  Thirty-four minutes.   HOSPITAL COURSE:  [name] is a 60 year old male with known paroxysmal  atrial fibrillation.  He came to the emergency room because he developed  tachy palpitations and weakness and found his heart rate to be approximately  200.  When he came to the emergency room he was in rapid atrial fibrillation  at 134.  He was evaluated by Dr. Andee Lineman and given flecainide.  He converted  to sinus rhythm and was held overnight for observation.   The next day [name] was maintaining sinus rhythm.  He is to take p.o.  Cardizem 60 mg on a p.r.n. basis for atrial fibrillation.  Dr. Andee Lineman feels  that on a short-term basis this will help his heart rate be better  controlled until he can reach medical care.  If he has recurrence of atrial  flutter consideration can be given  to p.r.n. flecainide.  He is to continue  Coumadin as well and is being started on Chantix.  [name] was evaluated  by Dr. Andee Lineman and considered stable for discharge with outpatient followup  arranged.   LABORATORY VALUES:  Hemoglobin 17.3, hematocrit 51, WBC is 9.5, platelets  170.  Sodium 139, potassium 4.4, chloride 107, CO2 23, BUN 12, creatinine  0.8, glucose 121.  Total cholesterol 102, triglycerides 111, HDL 27, LDL 53.  TSH 1.525.   Chest x-ray:  Cardiomegaly with mild pulmonary vascular congestion   DISCHARGE INSTRUCTIONS:  1.  His activity level is to be increased gradually to an unrestricted      activity level.  2.  He is to stick to a low-fat diet.  3.  He is to follow up with Dr. Samule Ohm in 3-6 months and he to follow up      with Dr. Artis Flock as well.  4.  He is to keep his Coumadin clinic appointments.  DISCHARGE MEDICATIONS:  1.  Cardizem 16 mg p.r.n.  2.  Aspirin 81 mg a day.  3.  Lipitor 80 mg q.d.  4.  Coumadin 5 mg q. day except for 10 mg on Monday, Wednesday and Friday.  5.  Chantix 0.5 mg daily for 3 days, then b.i.d. for 4 days, and then 1 mg      twice a day.      [name], P.A. [facility]      Learta Codding, M.D. [facility]  Electronically Signed    RB/MEDQ  D:  07/13/2006  T:  07/13/2006  Job:  [id number]   cc:   Quita Skye. Artis Flock, M.D.  Fax: [phone]

## 2011-05-17 NOTE — Cardiovascular Report (Signed)
NAME:  [name] [name], [name] [name] NO.:  [id number]   MEDICAL RECORD NO.:  [id number]                   PATIENT TYPE:  INP   LOCATION:  6533                                 FACILITY:  [facility]   PHYSICIAN:  Salvadore Farber, M.D. [facility]         DATE OF BIRTH:  October 09, 1951   DATE OF PROCEDURE:  12/07/2002  DATE OF DISCHARGE:  12/09/2002                              CARDIAC CATHETERIZATION   PROCEDURES:  Left heart catheterization, left ventriculography, coronary  angiography, failed percutaneous coronary intervention of obtuse marginal  #2, successful stenting of left anterior descending.   INDICATIONS:  The patient is a 60 year old gentleman with no prior history  of cardiac disease.  Early this morning, he developed the acute onset of  substernal chest discomfort.  He presented to the emergency room several  hours after the pain and was pain-free on arrival.  Troponin was elevated at  0.22.  He is advised to undergo diagnostic angiography given the non-ST  segment elevation myocardial infarction.   DIAGNOSTIC TECHNIQUE:  Informed consent was obtained. Under 1% lidocaine  local anesthesia, a 6 French sheath was placed in the right femoral artery  using the modified Seldinger technique.  Diagnostic angiography was  performed using JL4 and JR4 catheters.  Ventriculography was performed using  a pigtail in the RAO projection.  Angiograms demonstrated 80% stenosis of  the mid LAD and an occlusion of an OM-2.   The culprit lesion was unclear.  There was no dye staining of either to  suggest thrombosis.  Since the ECG was unrevealing, I presume that the  circumflex may well be the culprit as the large territory at risk in the LAD  would be unlikely to be electrocardiographically silent.  Therefore I  proceeded to attempt PCI of the OM-2.   Anticoagulation was initiated with heparin and Integrilin to achieve an ACT  of greater than 200 seconds.  The sheath was upsized to  a 7 [place] over a  wire.  A Q4.0 7 [place] guide was advanced over a wire and engaged in the  ostium of the left main coronary artery.  A BMW wire was advanced in the  circumflex and attempts were made to pass it beyond the occlusion of OM-2.  These were unsuccessful as were subsequent attempts with both the PT Graphix  and a Cross-It 100 wire.  Given the inability to cross the lesion, it seemed  that this was unlikely to be an acute occlusion, but more likely a chronic  total occlusion.   Attention was therefore turned to the 80% stenosis of the mid LAD.  Via the  pre-existing guide, a BMW wire was advanced over a wire and positioned in  the distal LAD.  The lesion was then pre-dilated using a 3.0 x 12 mm Quantum  [place]averick at 12 atmospheres.  The lesion was then stented using a 3.0 x 13 mm  Cypher at 16 atmospheres.  The stent was then post-dilated using a 3.25 x 13  mm PowerSail at 14 atmospheres.  The proximal portion of the stent was then  post-dilated using 3.5 x 8 mm PowerSail at 14 atmospheres.  The final  angiograms demonstrated complete occlusion of the OM-2 which I was unable to  cross.  There was no residual stenosis in the LAD.  There was TIMI-3 flow to  the distal LAD and persistent collateralization to the OM-2 via the LAD.    IMPRESSION/RECOMMENDATIONS:  Successful stenting of the mid left anterior  descending resulting in no residual stenosis and TIMI-3 flow. 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  We will continue Integrilin  for 18 hours. Plavix will be continued for a minimum of three months given  the drug-eluting stent.  Aspirin will be continued indefinitely.                                                  Salvadore Farber, M.D. [facility]    WED/MEDQ  D:  01/18/2003  T:  01/19/2003  Job:  [id number]

## 2011-05-17 NOTE — Op Note (Signed)
NAME:  [name] [name], SCHEAR NO.:  [id number]   MEDICAL RECORD NO.:  [id number]          PATIENT TYPE:  INP   LOCATION:                               FACILITY:  [facility]   PHYSICIAN:  Olga Millers, M.D. LHCDATE OF BIRTH:  [birth year]   DATE OF PROCEDURE:  11/05/2004  DATE OF DISCHARGE:                                 OPERATIVE REPORT   PROCEDURE:  Cardioversion of atrial fibrillation.   CARDIOLOGIST:  Olga Millers, M.D.   DESCRIPTION OF PROCEDURE:  The patient was sedated with Pentothal 170 mg  intravenously.  Synchronized cardioversion (biphasic), resulted in a normal  sinus rhythm.  There were no immediate complications.        ___________________________________________  Olga Millers, M.D. [facility]    BC/MEDQ  D:  11/05/2004  T:  11/05/2004  Job:  [id number]

## 2011-05-17 NOTE — Discharge Summary (Signed)
NAME:  [name] [name], [name] [name] NO.:  [id number]   MEDICAL RECORD NO.:  [id number]                   PATIENT TYPE:  INP   LOCATION:  6533                                 FACILITY:  [facility]   PHYSICIAN:  Charlies Constable, M.D. [facility]              DATE OF BIRTH:  [birth year]   DATE OF ADMISSION:  12/07/2002  DATE OF DISCHARGE:  12/09/2002                           DISCHARGE SUMMARY - REFERRING   PROCEDURE:  1. Cardiac catheterization.  2. Coronary arteriogram.  3. Left ventriculogram.  4. Cypher stent and PTCA to one vessel.   HOSPITAL COURSE:  The patient is a 60 year old male with no known history of  coronary artery disease.  He had onset of chest pain on the day of admission  that was not relieved with antacids and aspirin.  He was sent to emergency  room by EMS and was pain-free upon arrival. His initial troponin was  elevated at 0.22 and he was admitted for further evaluation and treatment.  His follow-up CK-MB was elevated at 123/15.4 and his troponin peaked at  2.07.  He was therefore considered ruled in for a non-ST segment elevation  MI.   A cardiac catheterization was performed on December 07, 2002.  It showed a  normal left main and an LAD with an 80% mid stenosis and a 60% distal  stenosis.  The first diagonal had 50% stenosis and an inferior branch of the  OM was totaled.  The RCA had a 20% stenosis.  The patient received PTCA and  a Cypher stent to the LAD reducing the stenosis to 0 with TIMI 3 flow.  He  tolerated the procedure well.  He was placed on Plavix for three months and  had Lipitor added to his medication regimen.   The patient had some periods of bradycardia with pauses greater than four  seconds after being started on Toprol XL at 50 mg a day. Because of this,  the beta blocker was discontinued. He had no further pauses after this.  Additionally, he was hyperglycemic upon admission with a blood sugar of 173.  A hemoglobin A1C was checked  and it was within normal limits at 6.2%. No  further workup is indicated at this time. He had a lipid profile checked as  well which showed an LDL of 136 and an HDL of 39.  Because of this, he had  Zocor added to his medication regimen.  He had some mild leukocytosis with a  white count of 20.5.  However, he was afebrile and had no cough, no dysuria,  and no other symptoms.  It was felt that this was secondary to MI and the  patient should follow up with his primary care physician if he develops any  other symptoms. He had been on Allegra D prior to admission but this was  changed to [place] post MI.  On December 09, 2002, the patient was  doing  well and considered stable for discharge.   Chest x-ray; cardiomegaly with no active disease.   LABORATORY DATA:  Hemoglobin 15.5, hematocrit 45.7, WBC 20.5, platelets 222.  Sodium 136, potassium 3.8, chloride 103, CO2 25, BUN 13, creatinine 0.8,  glucose 173.  Hemoglobin A1C 6.2%. Total cholesterol 193, triglycerides 88,  HDL 39, LDL 136.   CONDITION ON DISCHARGE:  Improved.   DISCHARGE DIAGNOSES:  1. Non ST segment elevation myocardial infarction, status post percutaneous     transluminal coronary angioplasty and stent to the left anterior     descending this admission.  2. 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  3. Hyperlipidemia.  4. History of tobacco use.  5. Bradycardia with pauses greater than four seconds secondary to beta     blocker.  6. Leukocytosis post myocardial infarction.  7. Seasonal allergies.  8. History of allergies to penicillin, sulfa, and Novocaine.   DISCHARGE INSTRUCTIONS:  His activity level is to include no driving for a  week, no sexual or strenuous activity until cleared by M.D. He is to stick  to a low fat diet with limited added sugar. He is to call  the office for  problems with the catheterization site. He is not to use tobacco. He is to  follow up with Dr. Artis Flock.  He is to follow up with cardiology as well.   DISCHARGE MEDICATIONS:  1. Allegra 180 mg daily.  2. Enteric-coated aspirin 325 mg daily.  3. Plavix 75 mg daily.  4. Nitroglycerin p.r.n.  5. Lipitor 80 mg daily.     Lavella Hammock, P.A. [facility]                  Charlies Constable, M.D. [facility]    RG/MEDQ  D:  01/26/2003  T:  01/26/2003  Job:  [id number]   cc:   Charlies Constable, M.D. [facility]  [address]  [place]  [place] [address]   Quita Skye. Artis Flock, M.D.  [address]  [place]  [place] [address]  Fax: [phone]

## 2011-05-17 NOTE — Discharge Summary (Signed)
NAME:  [name] [name], [name] [name] NO.:  [id number]   MEDICAL RECORD NO.:  [id number]                   PATIENT TYPE:  INP   LOCATION:  5531                                 FACILITY:  [facility]   PHYSICIAN:  Salvadore Farber, M.D.             DATE OF BIRTH:  14-Nov-1951   DATE OF ADMISSION:  02/27/2004  DATE OF DISCHARGE:  02/28/2004                                 DISCHARGE SUMMARY   HISTORY OF PRESENT ILLNESS:  This is a 60 year old male followed by Dr.  Samule Ohm in our group for history of coronary artery disease who presents to  [facility] emergency room for evaluation of premature ventricular  contractions. The patient had a myocardial infarction in December of 2003.  This was a non-ST elevation myocardial infarction. He underwent percutaneous  transluminal coronary angioplasty stenting of the left anterior descending  with residual non-obstructive disease of the RCA and diagonal. Recently, the  patient has had some sinus congestion. He took Careers adviser D last Wednesday and  daily since that time. On Sunday, he was talking on the phone when he noted  palpitations. He had no shortness of breath or chest pain associated with  this. He did notice an occasional fluttering sensation in his chest. The  patient states that he has had premature ventricular contractions in the  past but not as bad as this recent episode. He felt dizzy and light headed.  He had more premature ventricular contractions on the day of admission. A co-  worker reported that the patient did not look well. He had no shortness of  breath and no chest pain. Apparently he had been up most of the night  without sleep in a stressful situation. He was admitted for further  evaluation.   PAST MEDICAL HISTORY:  As noted, the patient underwent percutaneous  transluminal coronary angioplasty stenting of the left anterior descending  for a non-ST elevation myocardial infarction in December of 2003. He  had  mild residual non-obstructive disease of the RCA and diagonal at that time.  He has a history of elevated lipids, a history of tobacco use and history of  bradycardia.   ALLERGIES:  Include PENICILLIN, SULFA AND NOVOCAINE.   ADMISSION MEDICATIONS:  Include aspirin 325 mg daily, Lipitor 80 mg at  bedtime, Nitroglycerin although the patient has never taken this,  multivitamin as noted, recently Allegra D.   SOCIAL HISTORY:  The patient is divorced. He has 2 children. He works Educational psychologist.  He has a history of tobacco smoking 1 and 1/2 packs per day for 30 years. He  uses alcohol rarely.   FAMILY HISTORY:  The patient's mother died from cancer. The patient's father  died from cancer. There is no family history significant for coronary artery  disease.   HOSPITAL COURSE:  As noted, this patient was admitted to [facility]  for further evaluation of premature ventricular contractions. Cardiac  enzymes were performed that were negative for a myocardial infarction. He  was set up for an exercise Cardiolite that was performed on February 28, 2004.  The patient was able to exercise for 7 minutes and 33 seconds reaching a  maximal heart rate of 158 beats per minute. The Cardiolite scan revealed no  ischemia. Ejection fraction was 62%. There was a fixed anterior inferior  wall motion abnormality, consistent with his previous myocardial infarction.  Arrangements were made to discharge the patient later that evening in  improved and stable condition. Also it was reported that the patient had  some bradycardia on the telemetry monitor during his stay. There has been a  question of obstructive sleep apnea. This will be further evaluated on an  outpatient basis.   LABORATORY DATA:  A lipid profile revealed cholesterol of 118, triglycerides  105, HDL 25. LDL was 72.  A CBC revealed hemoglobin of 15.1, hematocrit  44.2. White blood cell 7.8,000. Platelets 218,000. A chemistry profile on  February 27, 2004 revealed BUN 11, creatinine 0.9, potassium 3.5, glucose  178. Hemoglobin A1C was high normal range of 5.9. Cardiac enzymes were  negative.   A chest x-ray showed vascular congestion without frank edema. Heart size was  upper limits of normal.   Electrocardiogram at time of admission showed normal sinus rhythm with rate  of 66 beats per minute with low voltage.   DISCHARGE MEDICATIONS:  1. Lipitor 80 mg at bedtime.  2. Coated aspirin 325 mg daily.  3. Nitroglycerin p.r.n. for chest pain.  4. Nasacort AQ twice daily as needed for nasal congestion. He was told to     avoid decongestants.  5. He was told to take Tylenol as needed for pain.   ACTIVITY:  As tolerated. The patient was told to try to get some regular  aerobic type of exercise. He was told to quit smoking.   DIET:  Low-fat, low-salt diet.   FOLLOW UP:  He is to see Dr. Samule Ohm on March 21, 2004 at 9:15 a.m.   DISCHARGE PROBLEM LIST:  1. Frequent premature ventricular contractions.  2. History of coronary artery disease with non-ST elevation myocardial     infarction in December of 2003, at which time he underwent percutaneous     transluminal coronary angioplasty stenting of the left anterior     descending.  3. Cardiolite this admission showing no ischemia. Ejection fraction of 62%     with fixed anterior and inferior wall motion abnormalities.  4. History of dyslipidemia.  5. History of tobacco use.  6. Obesity.  7. Recent sinus infection.  8. History of bradycardia.  9. Questionable obstructive sleep apnea.  10.      Mild congestion on chest x-ray.   ADDENDUM:  As noted, the patient's potassium was in the low normal range. He  was told to eat some extra bananas or citrus fruit.      Delton See, P.A. [facility]                  Salvadore Farber, M.D.    DR/MEDQ  D:  02/28/2004  T:  02/28/2004  Job:  [id number]   cc:   Rodolph Bong, M.D.  [address] [place], [place] [address]  Fax: [phone]

## 2011-05-17 NOTE — Op Note (Signed)
NAME:  [name] [name], [name] [name]                          ACCOUNT NO.:  [id number]   MEDICAL RECORD NO.:  [id number]                   PATIENT TYPE:  OIB   LOCATION:  3304                                 FACILITY:  [facility]   PHYSICIAN:  Zola Button T. Lazarus Salines, M.D.              DATE OF BIRTH:  1951/03/05   DATE OF PROCEDURE:  09/22/2003  DATE OF DISCHARGE:                                 OPERATIVE REPORT   PREOPERATIVE DIAGNOSIS:  Epiglottis/vallecular mucous retention cyst.   POSTOPERATIVE DIAGNOSIS:  Epiglottis/vallecular mucous retention cyst.   OPERATION PERFORMED:  Cold technique direct laryngoscopy with  marsupialization, epiglottis mucous retention cyst.   SURGEON:  Karol T. Lazarus Salines, M.D.   ANESTHESIA:  General orotracheal anesthesia.   ANESTHESIOLOGIST:  Guadalupe Maple, M.D.   ESTIMATED BLOOD LOSS:  Minimal.   COMPLICATIONS:  Difficult intubation secondary to an obstructive view  because of the epiglottic retention cyst.  A posterior pharyngeal wall  laceration from instrumentation, 2 cm vertical immediately behind the  larynx.   OPERATIVE FINDINGS:  A 2 cm smooth pedunculated and very mobile and floppy  mucous retention cyst on the lingual surface of the epiglottis on the right  side with a somewhat floppy epiglottis.  Normal endolarynx.  Normal soft  palate and small residual tonsils.  Moderately bulky tongue.   DESCRIPTION OF PROCEDURE:  With the patient in a comfortable supine  position, mask anesthesia was administered.  Direct visualization into the  glottis was not possible.  Several different attempts were made at blind  intubation, ________ intubation, bronchoscopic intubation, all of which were  incompletely successful.  The patient was ventilated intermittently between  attempts using laryngeal mask anesthesia with no complications.   Finally, with a cup forceps, using an anesthesia Miller blade laryngoscope,  I avulsed a small portion of the cyst wall and decompressed  the cyst.  Following this, the patient was readily intubated with a standard  endotracheal tube but conversion to a laser endotracheal tube proved  difficult owing to the angulation.  The decision was made not to continue  with attempt at laser, but rather, to do this with the cold technique.   The patient was placed in a slight reversed Trendelenburg position and the  table turned 90 degrees.  A clean preparation and draping was accomplished.  The rubber tooth guard which had been used during intubation attempts was  continued.  The Dedo laryngoscope was introduced and poised in the  vallecular region.  The floppy material constituting the cyst wall was  identified.  1% topical Adrenalin on a cotton pledget was applied against  the mucosa for intraoperative hemostasis and allowed to remain in place for  several  minutes.   The pledget was removed.  With the mucosa under traction, using  microlaryngoscopy dissecting scissors, the mucosa was resected off of the  lingual surface of the epiglottis piecemeal.  Upon removing most of  the cyst  wall, the scope was repositioned and a better inspection of an otherwise  normal-appearing epiglottis with a denuded portion on the right side  consistent with resection of the cyst was identified.  A small additional  quantity of mucosa was removed.  The cut edges of the mucosa were controlled  with cautery touched to the laryngeal suction tip.  Hemostasis was observed  and the cyst wall was fully marsupialized.   At this point the laryngoscope was unsuspended and the remainder of the  pharynx was inspected.  The endolarynx looked clear.  There was the  previously identified 2 cm vertical pharyngeal laceration consistent with  intubation attempts immediately behind the larynx.  The laryngoscope was  removed for several minutes.  Upon reinsertion, hemostasis was persistent.  At this point the procedure was completed.  The patient was returned to   anesthesia, awakened, extubated, and transferred to recovery in stable  condition.   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  He does  have a thick tongue, short jaw, obesity and sleep apnea which contributed to  difficult intubation but the epiglottic cyst itself was the primary  impediment.  Upon removal of the cyst, access to the endolarynx was  relatively routine and the patient will not be considered a long term  difficult intubation candidate.  We will get a chest x-ray in recovery to  look for any evidence of pneumothorax or mediastinal air, especially with  the posterior pharyngeal wall laceration.  I will keep him n.p.o. for 24  hours and then advance diet.  We will observe him in a step down unit to  rule out any complications.  We will deal with his sleep apnea later.                                               Gloris Manchester. Lazarus Salines, M.D.    KTW/MEDQ  D:  09/22/2003  T:  09/23/2003  Job:  [id number]   cc:   Quita Skye. Artis Flock, M.D.  [address]  [place]  [place] [address]  Fax: [phone]   Hewitt Shorts, M.D.  [address]  [place]  [place] [address]  Fax: [phone]

## 2011-05-17 NOTE — H&P (Signed)
NAME:  [name] [name], SHIFFLER NO.:  [id number]   MEDICAL RECORD NO.:  [id number]          PATIENT TYPE:  EMS   LOCATION:  MAJO                         FACILITY:  [facility]   PHYSICIAN:  Verne Grain, MD   DATE OF BIRTH:  05/25/1951   DATE OF ADMISSION:  11/04/2004  DATE OF DISCHARGE:                                HISTORY & PHYSICAL   PHYSICIANS:  Primary cardiologist:  Salvadore Farber, M.D.  Primary care physician:  Quita Skye. Kindl, M.D.   CHIEF COMPLAINT:  Atrial fibrillation with rapid ventricular rate (new onset  atrial fibrillation.   HISTORY OF PRESENT ILLNESS:  A 60 year old male, obese (328 pounds),  hyperlipidemia, history of mild elevation in blood glucose with normal  hemoglobin A1C in past, sleep apnea/CPAP with intermittent compliance,  ongoing tobacco at two packs per day, coronary artery disease status post  non-ST-elevation myocardial infarction (December 2003) with Cypher stent to  the mid LAD placed by Dr. 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  The patient noted sudden  onset palpitations today at 3:30 p.m. (November 04, 2004) with a sensation of  heart racing and flip-flopping, feeling weird, but specifically denied  any chest pain, shortness of breath, fevers, chills, nausea, vomiting,  diaphoresis, or other complaints.  The patient reports no heat or cold  intolerance.  No acute hair or skin changes.  No bowel or bladder  complaints.  No recent  change in weight or energy level prior to this event.  No unusual activity other than drinking a huge Quisnos iced tea at lunch  prior to this episode.  The patient reports that he called EMS with the  onset of his irregular heart beat.  The initial rate was approximately 150  beats per minute.  He reports being given diltiazem en route which decreased  his heart rate to 130.  An EKG performed in the emergency room with a heart  rate of approximately 125 revealed findings consistent with atrial  fibrillation and rapid ventricular rate but no changes diagnostic of  ischemia.  The patient was subsequently placed on a Diltiazem drip, re-  bolused, and titrated up to 15 mg per hour at which time his heart rate has  been reasonably well controlled (approximately 100 beats per minute).  The  patient still reports the sensation of flip-flopping occasionally but does  report that the heart racing has been significantly improved on Diltiazem.  CK-MB and troponin have been negative x 1.   PAST MEDICAL HISTORY:  1.  Coronary artery disease status post non-ST-elevation myocardial      infarction with Cypher  stent to the LAD by Dr. Samule Ohm and notation of      moderate distal disease (60%) in the LAD and diagonal #1 (50%) in      December 2003.  2.  Status post negative stress Cardiolite (February 2005) with ejection      fraction of 62% and no findings of ischemia (inferior artifact noted).  3.  History of PVCs/palpitations with Pseudoephedrine in the past negative      stress Cardiolite as described previously and with reduction of PVC      frequency when not taking Pseudoephedrine.  4.  Morbid obesity with history of sleep apnea maintained on chronic CPAP in      a setting of 15 with intermittent compliance.  (The patient originally      received this diagnosis with a sleep study that occurred in 2003 which      counted 13 events per hour with desaturation down to 73% and loud      snoring  noted.)  5.  History of childhood asthma and allergic rhinitis with p.r.n. nasal      steroid use.  6.  History of epiglottitis with airway obstruction and intubation status      post medical therapy and cyst removal form epiglottis.  7.  History of back surgery x 3.  8.  Hyperlipidemia.  9.  Ongoing tobacco use (two packs per day).  10. History of mildly elevated blood glucose with a normal hemoglobin A1C      noted in the past by his primary care physician, Dr. Artis Flock.   ALLERGIES AND ADVERSE DRUG REACTIONS:  1.  PENICILLIN caused reported reaction in childhood which patient cannot      recall.  2.  SULFA caused rash and swelling, although the patient reports he was      taking the SULFA medication with an additional medicine, so he is not      completely clear if the reaction was related to SULFA.  3.  BETA BLOCKER have reportedly caused sinus pauses in the past and BETA      BLOCKER sensation during patient's original CAD admission in December      2003.  (The patient does not take BETA BLOCKERS currently, reports that,      when in his regular rhythm, he has heart rate of approximately 60.)  4.  PSEUDOEPHEDRINE: Palpitations, increased PVCs as in HPI and Past Medical      History.   MEDICATIONS:  1.  Aspirin 325 mg p.o. daily.  2.  Lipitor 80 mg p.o. q.h.s.  3.  Multivitamins 1 tablet p.o. daily.  4.  Nasacort 1 spray per nostril daily p.r.n.  5.  Sublingual nitroglycerin p.r.n.  (The patient reports that he has not      used sublingual nitroglycerin at any time in the past year.)   SOCIAL HISTORY:  The patient lives in [place] by himself.  He is a  Environmental consultant.  He reports that he walks extensively at work but  participates in no formal exercise regimen.  He has been divorced for  approximately six years.  He has two children, ages 32 and 8, who are both  healthy.  He smokes cigarettes at the rate of two packs per day and cites difficulty with quitting despite  previous encouragement for tobacco  cessation by his primary care physician, pulmonary doctor, and others.  The  patient reports rare alcohol use (one beer per one to two months), and he  reports  no illicit drug use, no heroin, crack cocaine, or other illicit  drugs.   FAMILY HISTORY:  The patient's mother died at age 58 of ovarian cancer  without known comorbidity.  The patient's father died at age 36 of tobacco  use and lung cancer without other known comorbidity.  The patient has no  siblings.   REVIEW OF SYSTEMS:  Essentially negative.  The patient reports no fevers,  chills, sweats, recent weight change, adenopathy.  No headaches, no auditory  or visual changes, no skin lesion.  No chest pain, shortness of breath,  dyspnea on exertion, orthopnea, PND.  No edema, no presyncope, no  claudication.  No cough, no wheezing.  He does report palpitations as  described in HPI.  He does also report some increased urination followed  consumption of a huge Quisnos iced tea, but otherwise reports no other  bowel or bladder complaints; specifically, no dysuria.  His neuropsychiatric  status is stable.  His affect is somewhat flat, although he does nor report  a history of depression.  He reports no heat or cold intolerance, no skin or  hair changes suggestive of endocrinologic abnormality.  All other systems  are negative.   PHYSICAL EXAMINATION:  VITAL SIGNS:  Temperature 98.1, heart rate 134,  respiratory rate 18, blood pressure 121/71, oxygen saturation 98% on room  air.  GENERAL:  The patient is well appearing, pleasant, cooperative, in no  apparent distress.  Mildly flat affect but generally interactive and helpful  in the exam.  HEENT:  He is normocephalic and atraumatic.  Extraocular eye movements are  intact.  Pupils are equal, round, and reactive to light.  Oropharynx pink  and moist without lesions.  NECK:  Supple, obese, with no bruits, no jugular venous distention.  There  is no  lymphadenopathy appreciated.  There is no goiter appreciated.  HEART:  The patient has an irregularly irregular S1, S2 with no murmur and  no rub.  LUNGS:  The patient's lung fields are clear to auscultation bilaterally.  SKIN:  Brief skin examination reveals no acute rash.  ABDOMEN:  Obese, soft, nontender, nondistended, with positive bowel sounds.  EXTREMITIES:  Lower extremity examination reveals no evidence of edema.  Distal pulses are 2+ and symmetric, easily palpated bilaterally.  Femoral  pulses are 2+ and symmetric bilaterally.  There is no evidence of femoral  bruit.  NEUROLOGIC:  The patient's brief neurologic exam revealed that he is alert  and oriented and that he was without focal deficit as he was able to move  all four extremities without difficulty. Strength and sensation grossly  intact throughout.   LABORATORY AND X-RAY DATA:  Chest x-ray not done at time of this H&P.  EKG:  Heart rate of 125 with rhythm of atrial fibrillation with rapid  ventricular rate, normal axis, normal QRS interval, although RSR prime is  noted in V1.  QTC interval is difficult to calculate secondary to irregular  rhythm but appears to be within normal limits.  There are very small Q waves  in the anterolateral leads that are less than 1 mm in width and depth.  There are no changes diagnostic of ischemia.  There is no evidence of  hypertrophy.   Laboratory values:  White blood cell count 9.6, hematocrit 48, platelet  count 173.  Sodium 140, potassium 3.8, chloride 109, bicarb 23, BUN 15,  creatinine 0.8, glucose 112, total bilirubin 0.6, AST 25, ALT 42, alkaline  phosphatase 88, total protein 7.0, albumin  3.9.  Myoglobin 58, troponin I  less than 0.05, CK-MB 1.0.  PTT 27, INR 0.9.  Calcium 9.3.  Anion gap 8.   IMPRESSION AND PLAN:  A 60 year old male, obese, obstructive sleep apnea,  CPAP with intermittent compliance, hyperlipidemia, borderline hyperglycemia  in the past with hemoglobin A1C,  positive ongoing tobacco use at two packs  per day, coronary artery disease status post non-ST-elevation myocardial  infarction (February 2003) with left anterior descending  Cypher stent (Dr.  Samule Ohm) with recent negative stress Cardiolite (February 84166 with history  of premature ventricular contractions increased in frequency on  Pseudoephedrine with new onset atrial fibrillation with rapid ventricular  rate that began at 3:30 p.m. today (November 04, 2004) with only unusual  activity being consumption of a huge Quisnos iced tea with lunch.  No  chest pain, no shortness of breath, no ischemic changes on EKG.  Cardiac  markers negative x 1.  Heart rate 100 on Diltiazem drip (15 mg per hour).  Blood glucose 112.  Potassium 3.8.  History of sinus pauses with BETA  BLOCKERS.   1.  New onset atrial fibrillation at 3:30 p.m. November 04, 2004, with      history of symptomatic premature ventricular contractions increased with      PSEUDOEPHEDRINE, potassium 3.8.  Will anticoagulate the patient with      heparin, initiate Coumadin therapy this evening.  Will continue the      patient on Diltiazem drip for adequate rate control.  Will check a TSH      and free T4 to confirm euthyroid state.  Will check serial CK-MB and      troponin on telemetry to exclude myocardial infarction.  Will keep the      patient n.p.o. after midnight with orders for echocardiogram in the      morning to evaluate for any evidence of structural heart disease,      evaluate left atrial size, and if the patient does not spontaneously      cardiovert within the first 24 hours, will consider electrical      cardioversion plus/minus transesophageal echocardiogram (as discussed      with Dr. Diona Browner).  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  He reports the      onset at 3:30 p.m. today very clearly.  It is possible that he could     have episodes of  asymptomatic atrial fibrillation that may be related to      sleep apnea and occur during the sleeping hours.  However, in terms of      the patient's daytime hours, he reports a regular rhythm daily,      indicating that proceeding with DC cardioversion without need for TEE      within 48 hours of the time of onset (3:30 p.m. on November 04, 2004)      would be a reasonable strategy.  The patient will need Coumadin for      three to four weeks after cardioversion with a goal INR of 2 to 3 with      consideration of discontinuation of Coumadin at that time should he have      no further recurrences of atrial fibrillation. 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  If  needed, Holter monitor could also      be considered to confirm persistence of sinus rhythm and absence of      asymptomatic atrial fibrillation as an outpatient.  The patient will      have tobacco cessation, weight loss, and CPAP compliance encouraged      throughout this hospitalization. We will supplement patient's potassium      with 40 mEq of KCl x 1 and advise the patient to avoid/minimize caffeine      intake in the future. We will obtain chest x-ray PA and lateral as part      of his evaluation for new onset atrial fibrillation with rapid      ventricular rate, history of heavy tobacco use, and positive family      history of lung cancer in the patient's father who is also a tobacco      user.  2.  Coronary artery disease.  Will continue patient on aspirin and Lipitor.      The patient has a history of sinus pauses with BETA BLOCKERS.  He is      already on a Diltiazem drip for rate control of his atrial fibrillation.      Therefore, we will not administer additional beta blockers for now and      consider deferring further beta blocker use to the outpatient arena      following discharge.  The patient could also be  considered for an ACE      inhibitor for optimization of his coronary artery disease regimen;      however, this again could be done as an outpatient.  The patient will      have tobacco cessation and weight loss encouraged as previously      mentioned.  3.  Obesity/sleep apnea with intermittent CPAP compliance.  We will write      for patient to have CPAP overnight at his most recent documented setting      of 15.  We will encourage CPAP compliance every night when the patient      returns home following discharge.  Continue to encourage weight loss.  4.  History of allergic rhinitis.  Will continue Nasacort p.r.n. as      previously prescribed.  5.  Hyperlipidemia.  Continue Lipitor 80 mg p.o. q.h.s. as previously      prescribed.  The patient's LFTs are normal.  Will check a lipid profile      in the morning to assure that his lipids are at goal (LDL less than 70,      triglycerides less than 150).  6.  Obesity and history of mildly elevated blood glucose in the past with     current random blood glucose at 112 on the patient's admission x 7.  We      will check a hemoglobin A1C with morning labs again to rule out occult      type 2 diabetes mellitus and encourage weight loss/exercise as      previously mentioned.  7.  Ongoing tobacco use (two packs per day).  The patient has been counseled      on tobacco cessation in the past as documented on previous notes.  The      patient reports difficulty with tobacco cessation despite the strong      recommendations from all of his physicians.  We will continue to support      any efforts at tobacco cessation expressed by patient during  this      hospitalization, and the patient was offered assistance with      pharmacologic      therapy (Zyban) for his consideration, and other methods of tobacco      cessation were discussed during admission discussions in the emergency      room.  The patient will give further consideration of these  methods      throughout his hospitalization.       DDH/MEDQ  D:  11/04/2004  T:  11/04/2004  Job:  [id number]   cc:   Salvadore Farber, M.D. [facility]  [address]  [address]  [place]  [place] [address]   Quita Skye. Artis Flock, M.D.  [address]  [place]  [place] [address]  Fax: [phone]

## 2011-05-17 NOTE — Assessment & Plan Note (Signed)
[facility]                              CARDIOLOGY OFFICE NOTE   [name] [name], [name] [name]                       MRN:          [id number]  DATE:11/24/2006                            DOB:          1951/02/06    HISTORY OF PRESENT ILLNESS:  [name] [name] is a 60 year old gentleman with  atherosclerotic coronary disease, for which he is status post non-ST  elevation myocardial infarction treated with a drug-eluting stent to the mid-  LAD in 2003.  His ejection fraction is normal.  He has not had any recurrent  angina or exertional dyspnea.   In addition, he has had paroxysmal atrial fibrillation on one occasion in  July 2007.  He has remained in sinus rhythm after chemical cardioversion  with flecainide by Dr. Andee Lineman.  He has had no recurrent palpitations.   CURRENT MEDICATIONS:  1. Lipitor 80 mg per day.  2. Multivitamin.  3. Coumadin.  4. Aspirin 81 mg per day.  5. Cardizem p.r.n. palpitations.  6. Protonix 40 mg per day.  7. Toprol XL 50 mg per day.  8. He has not filled the prescription for Wellbutrin that we gave him      previously.   PHYSICAL EXAMINATION:  GENERAL:  He is obese, in no distress.  VITAL SIGNS:  Heart rate 70, blood pressure 126/80, and weight of 335  pounds, up 4 pounds since October.  NECK:  He has no jugular venous distention, no thyromegaly and no  lymphadenopathy.  LUNGS:  Clear to auscultation.  CARDIAC:  He has a nondisplaced point of maximum cardiac impulse.  There is  a regular rate and rhythm without murmur, rub, or gallop.  ABDOMEN:  Soft, nondistended, nontender.  There is no hepatosplenomegaly.  Bowel sounds are normal.  EXTREMITIES:  Warm without clubbing, cyanosis, edema, or ulceration.   IMPRESSION/RECOMMENDATIONS:  1. Coronary disease.  Prior anterior myocardial infarction.  Ejection      fraction preserved.  Continue aspirin and beta blocker.  Drug-eluting      stent in the left anterior descending artery;  however, with long-term      Coumadin, will not continue Plavix.  2. Paroxysmal atrial fibrillation.  Continue strategy of rate control and      anticoagulation.  Holding sinus rhythm at present.  3. Sleep apnea.  Managed by Dr. Shelle Iron.  Says he is compliant with his      CPAP.  4. Tobacco abuse.  Continues to smoke 2 packs per day.  Did not fill his      prescription for Wellbutrin but says he is interested in trying.  We      tried Chantix but stopped due to severe nausea and epigastric pain.  I      strongly encouraged him to fill this prescription today and set a firm      quit date.  5. Obesity.  We had a frank discussion about the adverse effect on his      long-term health.  He set a goal of losing 20 pounds over the next 4  months.  We will have him follow up then.     Salvadore Farber, MD  Electronically Signed    WED/MedQ  DD: 11/24/2006  DT: 11/24/2006  Job #: [id number]

## 2011-05-18 ENCOUNTER — Emergency Department (HOSPITAL_COMMUNITY): Payer: 59

## 2011-05-18 ENCOUNTER — Observation Stay (HOSPITAL_COMMUNITY)
Admission: EM | Admit: 2011-05-18 | Discharge: 2011-05-18 | Disposition: A | Discharge status: Home / Self Care | Payer: 59 | Attending: Interventional Cardiology | Admitting: Interventional Cardiology

## 2011-05-18 DIAGNOSIS — I4891 Unspecified atrial fibrillation: Principal | ICD-10-CM | POA: Insufficient documentation

## 2011-05-18 DIAGNOSIS — Z7982 Long term (current) use of aspirin: Secondary | ICD-10-CM | POA: Insufficient documentation

## 2011-05-18 DIAGNOSIS — J45909 Unspecified asthma, uncomplicated: Secondary | ICD-10-CM | POA: Insufficient documentation

## 2011-05-18 DIAGNOSIS — E785 Hyperlipidemia, unspecified: Secondary | ICD-10-CM | POA: Insufficient documentation

## 2011-05-18 DIAGNOSIS — I517 Cardiomegaly: Secondary | ICD-10-CM | POA: Insufficient documentation

## 2011-05-18 DIAGNOSIS — K219 Gastro-esophageal reflux disease without esophagitis: Secondary | ICD-10-CM | POA: Insufficient documentation

## 2011-05-18 DIAGNOSIS — I252 Old myocardial infarction: Secondary | ICD-10-CM | POA: Insufficient documentation

## 2011-05-18 DIAGNOSIS — G4733 Obstructive sleep apnea (adult) (pediatric): Secondary | ICD-10-CM | POA: Insufficient documentation

## 2011-05-18 DIAGNOSIS — R0609 Other forms of dyspnea: Secondary | ICD-10-CM | POA: Insufficient documentation

## 2011-05-18 DIAGNOSIS — I2510 Atherosclerotic heart disease of native coronary artery without angina pectoris: Secondary | ICD-10-CM | POA: Insufficient documentation

## 2011-05-18 DIAGNOSIS — N529 Male erectile dysfunction, unspecified: Secondary | ICD-10-CM | POA: Insufficient documentation

## 2011-05-18 DIAGNOSIS — R0989 Other specified symptoms and signs involving the circulatory and respiratory systems: Secondary | ICD-10-CM | POA: Insufficient documentation

## 2011-05-18 LAB — POCT I-STAT, CHEM 8
BUN: 18 mg/dL (ref 6–23)
Calcium, Ion: 1.02 mmol/L — ABNORMAL LOW (ref 1.12–1.32)
Chloride: 108 mEq/L (ref 96–112)
Creatinine, Ser: 1.4 mg/dL (ref 0.4–1.5)
Glucose, Bld: 129 mg/dL — ABNORMAL HIGH (ref 70–99)
HCT: 51 % (ref 39.0–52.0)
Hemoglobin: 17.3 g/dL — ABNORMAL HIGH (ref 13.0–17.0)
Potassium: 4.2 mEq/L (ref 3.5–5.1)
Sodium: 139 mEq/L (ref 135–145)
TCO2: 22 mmol/L (ref 0–100)

## 2011-05-18 LAB — CBC
HCT: 49.3 % (ref 39.0–52.0)
Hemoglobin: 17.6 g/dL — ABNORMAL HIGH (ref 13.0–17.0)
MCH: 31.8 pg (ref 26.0–34.0)
MCHC: 35.7 g/dL (ref 30.0–36.0)
MCV: 89.2 fL (ref 78.0–100.0)
Platelets: 154 10*3/uL (ref 150–400)
RBC: 5.53 MIL/uL (ref 4.22–5.81)
RDW: 13.1 % (ref 11.5–15.5)
WBC: 8.6 10*3/uL (ref 4.0–10.5)

## 2011-05-18 LAB — POCT CARDIAC MARKERS
CKMB, poc: <1 ng/mL — ABNORMAL LOW (ref 1.0–8.0)
Myoglobin, poc: 44.8 ng/mL (ref 12–200)
Troponin i, poc: <0.05 ng/mL (ref 0.00–0.09)

## 2011-05-18 LAB — DIFFERENTIAL
Basophils Absolute: 0 10*3/uL (ref 0.0–0.1)
Basophils Relative: 0 % (ref 0–1)
Eosinophils Absolute: 0.7 10*3/uL (ref 0.0–0.7)
Eosinophils Relative: 8 % — ABNORMAL HIGH (ref 0–5)
Lymphocytes Relative: 28 % (ref 12–46)
Lymphs Abs: 2.4 10*3/uL (ref 0.7–4.0)
Monocytes Absolute: 0.6 10*3/uL (ref 0.1–1.0)
Monocytes Relative: 7 % (ref 3–12)
Neutro Abs: 4.9 10*3/uL (ref 1.7–7.7)
Neutrophils Relative %: 57 % (ref 43–77)

## 2011-05-18 LAB — PHOSPHORUS: Phosphorus: 3 mg/dL (ref 2.3–4.6)

## 2011-05-18 LAB — MAGNESIUM: Magnesium: 1.9 mg/dL (ref 1.5–2.5)

## 2011-05-18 LAB — PROTIME-INR
INR: 1.03 (ref 0.00–1.49)
Prothrombin Time: 13.7 seconds (ref 11.6–15.2)

## 2011-05-18 NOTE — Op Note (Signed)
  NAME:  KASON, BENAK NO.:  [id number]  MEDICAL RECORD NO.:  [id number]           PATIENT TYPE:  I  LOCATION:  2914                         FACILITY:  [facility]  PHYSICIAN:  Jonelle Sidle, MD DATE OF BIRTH:  [birth year]  DATE OF PROCEDURE: DATE OF DISCHARGE:                              OPERATIVE REPORT   PRIMARY CARDIOLOGIST:  Madolyn Frieze. [name], MD, [facility]  INDICATION:  Recurrent atrial fibrillation.  DESCRIPTION OF PROCEDURE:  After informed consent was obtained, and discussion had with the patient and his wife, the patient was positioned for elective direct current cardioversion.  Anterior and posterior pads were placed for biphasic defibrillation.  Anesthesia Service, Dr. Sampson Goon, present to provide sedation, which included lidocaine 50 and propofol 110.  Once adequate sedation was achieved, a single 200 joule shock was delivered with successful restoration of sinus rhythm, heart rate in the 50s, blood pressure 119/76.  The patient tolerated the procedure well without immediate complication.  Followup electrocardiogram shows sinus bradycardia at 50 beats per minute, nonspecific T-wave changes, QTC 399 milliseconds.  DISPOSITION:  It is anticipated that the patient will be observed following anesthesia, ambulated, and likely discharged home later this evening for outpatient follow-up on his present cardiac regimen, and most likely electrophysiology referral to discuss the possibility of atrial fibrillation ablation.     Jonelle Sidle, MD     SGM/MEDQ  D:  05/18/2011  T:  05/18/2011  Job:  [id number]  Electronically Signed by Nona Dell MD on 05/18/2011 08:14:27 PM

## 2011-05-18 NOTE — H&P (Signed)
NAME:  [name] [name], [name] [name] NO.:  [id number]  MEDICAL RECORD NO.:  [id number]           PATIENT TYPE:  I  LOCATION:  2914                         FACILITY:  [facility]  PHYSICIAN:  Jonelle Sidle, MD DATE OF BIRTH:  November 14, 1951  DATE OF ADMISSION:  05/18/2011 DATE OF DISCHARGE:                             HISTORY & PHYSICAL   PRIMARY CARE PHYSICIAN:  Neta Mends. Panosh, MD.  PRIMARY CARDIOLOGIST:  Madolyn Frieze. [name], MD, [facility]  REASON FOR ADMISSION:  Recurrent atrial fibrillation.  HISTORY OF PRESENT ILLNESS:  [name] [name] is a 60 year old obese male with history of coronary artery disease status post stent placement to the LAD in 2003, obstructive sleep apnea on CPAP, hyperlipidemia, and well- documented paroxysmal atrial fibrillation with low CHAD2 score, not requiring long-term anticoagulation.  He has been managed with aspirin and was initiated on Tikosyn back in November of last year secondary to recurrent episodes of atrial fibrillation requiring cardioversion.  He has not manifested any obvious recurrent atrial fibrillation since November of last year, and was seen by Dr. [name] recently.  [name] [name] states that around midnight on the day of presentation, he developed a sense of palpitations consistent with atrial fibrillation, and went to the emergency department at [facility] for further evaluation.  Electrocardiogram documented atrial fibrillation with rapid ventricular response initially in the 120s to 130s.  He was initiated on heparin and also low-dose Cardizem infusion at 5 mg per hour, subsequently transitioned to the 2900 units under our care with plans for possible cardioversion attempt.  He did eat breakfast at 7:30, and the situation was discussed with anesthesia.  Plan at this point is for an elective direct current cardioversion late this afternoon in an attempt to restore sinus rhythm.  He reports compliance with his medications and laboratory data  are reviewed below.  ALLERGIES:  PENICILLIN, PROCAINE, SULFA DRUGS, PSEUDOEPHEDRINE, AND AMLODIPINE.  MEDICATIONS:  Home medications include, 1. Ambien 10 mg p.o. at bedtime p.r.n. 2. Enteric-coated aspirin 325 mg p.o. daily. 3. Dofetilide 500 mcg p.o. b.i.d. 4. Famotidine 10 mg p.o. daily p.r.n. 5. Omega-3 supplements 1000 mg 1-2 tablets p.o. b.i.d. 6. Lipitor 80 mg p.o. daily. 7. Multivitamin one p.o. daily. 8. Sublingual nitroglycerin spray 0.4 mg p.r.n. 9. Potassium 20 mEq p.o. daily. 10.Protonix 40 mg p.o. q.a.m.  Medications at the present time also include diltiazem infusion at 5 mg per hour, heparin infusion per pharmacy.  PAST MEDICAL HISTORY:  Noted above.  Also prior documentation of symptomatic PVCs, bradycardia on beta-blocker therapy, gastroesophageal reflux disease, ulnar neuropathy, umbilical hernia, erectile dysfunction, resection of cyst on epiglottis, L4-L5 surgery, umbilical hernia repair.  SOCIAL HISTORY:  The patient is a retired Radiation protection practitioner, previous tobacco user, quit in 2010, no alcohol use or drug use.  He is married with children.  REVIEW OF SYSTEMS:  As detailed above.  Reports being in his usual state of health, tolerating CPAP, no recent exertional chest pain or shortness of breath.  Main complaint of palpitations as noted above, just recently noted.  No reported bleeding problems.  No focal weakness or memory/speech deficits.  Otherwise reviewed  negative.  PHYSICAL EXAMINATION:  VITAL SIGNS:  Blood pressure is 110/80, heart rate is in the 80s in atrial fibrillation, respirations 18, nonlabored. GENERAL:  The patient is afebrile, morbidly obese, weighing approximately 340 pounds. HEENT:  Conjunctivae and lids are normal.  Oropharynx is clear with moist mucosa. NECK:  Supple.  Increased girth.  No obvious bruits or thyromegaly. LUNGS:  Clear with diminished breath sounds. CARDIAC:  Reveals an irregularly irregular rhythm with no S3 gallop  or pericardial rub. ABDOMEN:  Obese, nontender.  Bowel sounds present. EXTREMITIES:  Exhibit no significant pitting edema.  Distal pulses are 1 to 2+. SKIN:  Warm and dry. MUSCULOSKELETAL:  No kyphosis is noted. NEUROPSYCHIATRIC:  The patient is alert and oriented x3.  Affect is appropriate.  LABORATORY DATA:  WBCs 8.6, hemoglobin 17.3, hematocrit 51.0, platelets 154,000, INR 1.0.  Sodium 139, potassium 4.2, chloride 108, glucose 129, BUN 18, creatinine 1.4, magnesium 1.9.  CK-MB less than 1.0, troponin-I less than 0.05.  Chest x-ray reports some vascular congestion, but no pulmonary edema or effusions, mildly enlarged cardiac silhouette.  Echocardiogram from July 2011 reported an LVEF of approximately 55% with mild left atrial enlargement, normal right ventricular function, pulmonary artery systolic pressure of approximately 45 mmHg.  A 12-lead electrocardiogram from earlier today showed atrial fibrillation of 124 beats per minute with R-prime in lead V1, nonspecific T-wave changes, left anterior fascicular block with axis of negative 67.  QTc of 488 msec.  IMPRESSION: 1. Recurrent atrial fibrillation with rapid ventricular response,     heart rate better with low-dose diltiazem infusion, and now placed     on heparin, with onset of arrhythmia within the last 24 hours based     on symptoms.  The patient has a low thromboembolic risk score,     followed long-term by Dr. [name], without plans for active     anticoagulation at this point.  He is maintained on aspirin and     Tikosyn, and is on no specific rate lowering medicines as an     outpatient with prior documentation of bradycardia when in sinus     rhythm.  He does have Cardizem 60 mg tablets to be used p.r.n., and     has not required any recently. 2. Morbid obesity. 3. Obstructive sleep apnea, compliant with CPAP, followed by Dr.     Shelle Iron. 4. Coronary artery disease status post previous stent placement to the      left anterior descending in 2003, no recent chest pain, cardiac     markers normal. 5. No long-term history of hypertension, diabetes mellitus, thyroid     disease, stroke, or congestive heart failure.  LVEF was 55% as of     echocardiogram in July 2011.  PLAN:  Situation was reviewed with the patient.  He did eat breakfast at 7:30 this morning, however is eager to be considered for a cardioversion attempt later this afternoon after 8 hours of n.p.o. status, with discharge home after observation.  He is already on heparin, which was initiated in the emergency room, and is otherwise hemodynamically stable.  Lab work was reviewed.  The case was discussed with Dr. Sampson Goon of Anesthesia and at this point, the plan is to proceed with an elective direct current cardioversion later this afternoon with potential plan for discharge this evening on continued medical therapy, to include aspirin and Tikosyn.  As noted in Dr. Ludwig Clarks most recent office note, it would be appropriate to consider referral to Dr. Johney Frame  for discussions of atrial fibrillation ablation. If on the other hand, we are not able to get him back into normal sinus rhythm, he will be admitted for further assessment, likely EP consultation. Jonelle Sidle, MD     SGM/MEDQ  D:  05/18/2011  T:  05/18/2011  Job:  [id number]  cc:   Neta Mends. Fabian Sharp, MD Madolyn Frieze [name], MD, [facility]  Electronically Signed by Nona Dell MD on 05/18/2011 08:13:33 PM

## 2011-05-20 ENCOUNTER — Telehealth: Payer: Self-pay | Admitting: Cardiology

## 2011-05-20 NOTE — Telephone Encounter (Signed)
Per pt calling. Returning call back - pt has caller id- someone called his home.

## 2011-05-20 NOTE — Telephone Encounter (Signed)
Patient states was in the Er on 05/18/11 due to A fibrllation. Pt was DCCV the same day. Pt. Is to have a F/U appointment with Dr. [name] in 2 weeks. Roe Coombs Scheduler aware. She will call patient tomorrow to set up an appointment.with MD. Patient aware.

## 2011-05-23 NOTE — Discharge Summary (Signed)
NAME:  [name] [name], MCOMBER NO.:  [id number]  MEDICAL RECORD NO.:  [id number]           PATIENT TYPE:  I  LOCATION:  2914                         FACILITY:  [facility]  PHYSICIAN:  Jonelle Sidle, MD DATE OF BIRTH:  [birth year]  DATE OF ADMISSION:  05/18/2011 DATE OF DISCHARGE:  05/18/2011                              DISCHARGE SUMMARY   CARDIOLOGIST:  Madolyn Frieze. [name], MD, [facility]  PRIMARY CARE PHYSICIAN:  Neta Mends. Panosh, MD  REASON FOR ADMISSION:  Atrial fibrillation with rapid ventricular rate.  DISCHARGE DIAGNOSES: 1. Persistent atrial fibrillation, on Tikosyn therapy.     a.     Status post direct current cardioversion this admission      restoring normal sinus rhythm. 2. Coronary artery disease, status post left anterior descending stent     in 2003. 3. Sleep apnea, on continuous positive airway pressure. 4. Hyperlipidemia. 5. Gastroesophageal reflux disease. 6. Erectile dysfunction. 7. Ex-smoker.  PROCEDURES PERFORMED THIS ADMISSION:  Direct current cardioversion, Dr. Nona Dell on May 18, 2011.  ADMISSION HISTORY:  [name] is a 60 year old male with history of atrial fibrillation in the past.  He is currently on Tikosyn therapy. He only takes aspirin because his CHADS2 score is low.  He developed palpitations around midnight on the day of presentation, was found to be in atrial fibrillation, with heart rates in the 120s-130s.  He was placed on diltiazem drip.  He was also placed on heparin.  HOSPITAL COURSE:  The patient was brought to the unit and cardioverted by Dr. Diona Browner to normal sinus rhythm.  He is now in sinus bradycardia with a heart rate of 51, blood pressure 113/64.  He feels well.  He has no complaints.  We will plan to discharge him now to home on same medications as when he was admitted.  He will have followup with Dr. [name] in the next couple of weeks and we will also get him in to see Dr. Johney Frame to discuss possible atrial  fibrillation ablation.  ALLERGIES: 1. PENICILLIN. 2. Novocain. 3. SULFA. 4. PSEUDOEPHEDRINE. 5. AMLODIPINE.  DISCHARGE MEDICATIONS: 1. Ambien 10 mg every night p.r.n. 2. Aspirin 325 mg daily. 3. Tikosyn 500 mcg b.i.d. 4. Famotidine 10 mg daily as needed. 5. Fish oil 1000 mg b.i.d. 6. Levitra 20 mg daily as needed. 7. Lipitor 80 mg daily. 8. Nitroglycerin p.r.n. chest pain. 9. Protonix 40 mg daily. 10.Potassium 20 mEq daily.  ACTIVITIES:  He is to refrain from driving for 1 day.  DIET:  Low-fat, low-sodium diet.  WOUND CARE:  Not applicable.  FOLLOWUP: 1. With Dr. [name] in 2 weeks and our office will contact him with     an appointment. 2. He will be referred to Dr. Johney Frame for possible atrial fibrillation     ablation.  The office will contact him with an appointment.  Total physician and PA time greater than 30 minutes.     Tereso Newcomer, PA-C   ______________________________ Jonelle Sidle, MD    SW/MEDQ  D:  05/18/2011  T:  05/19/2011  Job:  [id number]  cc:   Neta Mends.  Fabian Sharp, MD  Electronically Signed by Tereso Newcomer PA-C on 05/19/2011 07:36:22 AM Electronically Signed by Nona Dell MD on 05/23/2011 05:49:25 PM

## 2011-05-28 ENCOUNTER — Encounter: Payer: Self-pay | Admitting: Physician Assistant

## 2011-06-04 ENCOUNTER — Ambulatory Visit (INDEPENDENT_AMBULATORY_CARE_PROVIDER_SITE_OTHER): Payer: 59 | Admitting: Physician Assistant

## 2011-06-04 ENCOUNTER — Telehealth: Payer: Self-pay | Admitting: *Deleted

## 2011-06-04 ENCOUNTER — Encounter: Payer: Self-pay | Admitting: Physician Assistant

## 2011-06-04 ENCOUNTER — Other Ambulatory Visit: Payer: Self-pay | Admitting: *Deleted

## 2011-06-04 VITALS — BP 114/78 | HR 53 | Ht 74.0 in | Wt 343.0 lb

## 2011-06-04 DIAGNOSIS — R002 Palpitations: Secondary | ICD-10-CM

## 2011-06-04 DIAGNOSIS — I4891 Unspecified atrial fibrillation: Secondary | ICD-10-CM

## 2011-06-04 DIAGNOSIS — E785 Hyperlipidemia, unspecified: Secondary | ICD-10-CM

## 2011-06-04 DIAGNOSIS — I2510 Atherosclerotic heart disease of native coronary artery without angina pectoris: Secondary | ICD-10-CM

## 2011-06-04 LAB — MAGNESIUM: Magnesium: 1.9 mg/dL (ref 1.5–2.5)

## 2011-06-04 NOTE — Patient Instructions (Signed)
Your physician recommends that you schedule a follow-up appointment in: KEEP YOUR APPOINTMENT 06/19/11 @ 10:45 WITH DR. ALLRED  Your physician recommends that you schedule a follow-up appointment in: 3 MONTHS WITH DR. CRENSHAW AS PER SCOTT WEAVER, PA-C  Your physician recommends that you return for lab work in: TODAY MAGNESIUM [phone]

## 2011-06-04 NOTE — Progress Notes (Signed)
History of Present Illness: Primary Cardiologist: Dr. Olga Millers  [name] [name] is a 60 y.o. male who has a history of coronary artery disease status post stent to his LAD in December 2003 as well as paroxysmal atrial fibrillation. Last Myoview was in May of 2010. There was extensive soft tissue attenuation but no ischemia or infarction. The calculated ejection fraction was 48% but visually appeared better.  Last echocardiogram in July 2011 showed normal LV function, mild biatrial enlargement and mild right ventricular enlargement.  Patient had recurrent atrial fibrillation in November of 2011 and had cardioversion. However his symptoms returned and he was admitted and tikosyn was initiated. He had successful cardioversion. He was  admitted to [facility] 5/19 with recurrent AFib with RVR.  He was successfully cardioverted to NSR and returns today for follow up.  Labs 5/19: K 4.2, Mg 1.9, Hgb 17.6  Doing well.  No chest pain.  No syncope.  No palpitations.  No orthopnea or PND.  No significant edema.  He notes some dyspnea.  It seems to be related to anxiety.  He denies any relation to exertion.  He has an appt with Dr. Johney Frame in a few weeks.  Past Medical History  Diagnosis Date  . HYPERLIPIDEMIA 03/22/2009  . Obesity, unspecified 04/24/2009  . THROMBOCYTOPENIA 08/16/2010  . ERECTILE DYSFUNCTION 03/22/2009  . SLEEP APNEA, OBSTRUCTIVE 03/22/2009  . 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 Coronary atherosclerosis of native coronary artery 11/2002    a. s/p stent to LAD 12/03; OM2 occluded at cath 12/03; d. myoview 5/10: no ischemia;  e. echo 7/11: EF 55%, BAE, mild RVE, PASP 41-45  . Atrial fibrillation 03/22/2009    a. s/p multiple DCCV; b. no coumadin due to low TE risk profile; c. Tikosyn Rx  . GERD 03/22/2009  . LATERAL EPICONDYLITIS, LEFT 10/24/2009  . HYPERGLYCEMIA 04/25/2010  . LIVER FUNCTION TESTS, ABNORMAL 04/25/2010  . TOBACCO USE, QUIT 10/24/2009  . Umbilical hernia     Current Outpatient  Prescriptions  Medication Sig Dispense Refill  . aspirin 325 MG tablet Take 325 mg by mouth daily.        [name] atorvastatin (LIPITOR) 80 MG tablet Take 1 tablet (80 mg total) by mouth daily.  90 tablet  3  . dofetilide (TIKOSYN) 500 MCG capsule Take 500 mcg by mouth 2 (two) times daily.        . famotidine (PEPCID) 10 MG tablet Take 10 mg by mouth as needed.       . fish oil-omega-3 fatty acids 1000 MG capsule Take 2 g by mouth daily.        . MULTIPLE VITAMIN PO Take by mouth.        . nitroGLYCERIN (NITROLINGUAL) 0.4 MG/SPRAY spray Place 1 spray under the tongue every 5 (five) minutes as needed.        . pantoprazole (PROTONIX) 40 MG tablet Take 1 tablet (40 mg total) by mouth daily.  90 tablet  3  . potassium chloride SA (K-DUR,KLOR-CON) 20 MEQ tablet Take 20 mEq by mouth daily.        [name] zolpidem (AMBIEN) 10 MG tablet Take 1 tablet (10 mg total) by mouth at bedtime as needed.  30 tablet  5  . DISCONTD: albuterol (PROVENTIL) 90 MCG/ACT inhaler Inhale 2 puffs into the lungs every 6 (six) hours as needed for wheezing.  17 g  0    Allergies: Allergies  Allergen Reactions  . Penicillins   . Procaine Hcl  REACTION: Reaction not known  . Pseudoephedrine     REACTION: Reaction not known  . Sulfonamide Derivatives     Vital Signs: BP 114/78  Pulse 53  Ht 6\' 2"  (1.88 m)  Wt 343 lb (155.584 kg)  BMI 44.04 kg/m2  PHYSICAL EXAM: Well nourished, well developed, in no acute distress HEENT: normal Neck: no JVD Cardiac:  normal S1, S2; RRR; no murmur Lungs:  clear to auscultation bilaterally, no wheezing, rhonchi or rales Abd: soft, nontender, no hepatomegaly Ext: trace bilateral edema Skin: warm and dry Neuro:  CNs 2-12 intact, no focal abnormalities noted  EKG:  Sinus bradycardia, heart rate 53, normal axis, nonspecific ST-T changes, QTc 429 ms  ASSESSMENT AND PLAN:

## 2011-06-04 NOTE — Telephone Encounter (Signed)
See lab order

## 2011-06-04 NOTE — Assessment & Plan Note (Signed)
No angina.  Continue ASA and statin.  He has some dyspnea that seems to be related to anxiety.  He will contact us if this changes.  Otherwise, follow up with Dr. [name] in 3 months.

## 2011-06-04 NOTE — Assessment & Plan Note (Signed)
Managed by PCP

## 2011-06-04 NOTE — Progress Notes (Signed)
Addended by: Tarri Fuller on: 06/04/2011 02:17 PM   Modules accepted: Orders

## 2011-06-04 NOTE — Assessment & Plan Note (Signed)
He remains in normal sinus rhythm.  He denies any further palpitations.  He has an appointment with Dr. Johney Frame in a couple of weeks to discuss atrial fibrillation ablation.  His CHADS2 score is essentially zero and his CHADS2-VASc score is one.  Therefore, he is not on Coumadin.  He remains on Tikosyn.  His magnesium was 1.9 the hospital.  We will repeat that today.  If it remains 1.9, I would suggest he take magnesium oxide 400 mg daily.  Follow up with Dr. [name] in 3 months.

## 2011-06-19 ENCOUNTER — Ambulatory Visit (INDEPENDENT_AMBULATORY_CARE_PROVIDER_SITE_OTHER): Payer: 59 | Admitting: Internal Medicine

## 2011-06-19 ENCOUNTER — Encounter: Payer: Self-pay | Admitting: Internal Medicine

## 2011-06-19 ENCOUNTER — Other Ambulatory Visit (INDEPENDENT_AMBULATORY_CARE_PROVIDER_SITE_OTHER): Payer: 59 | Admitting: *Deleted

## 2011-06-19 DIAGNOSIS — I2510 Atherosclerotic heart disease of native coronary artery without angina pectoris: Secondary | ICD-10-CM

## 2011-06-19 DIAGNOSIS — E669 Obesity, unspecified: Secondary | ICD-10-CM

## 2011-06-19 DIAGNOSIS — G4733 Obstructive sleep apnea (adult) (pediatric): Secondary | ICD-10-CM

## 2011-06-19 DIAGNOSIS — R002 Palpitations: Secondary | ICD-10-CM

## 2011-06-19 DIAGNOSIS — I4891 Unspecified atrial fibrillation: Secondary | ICD-10-CM

## 2011-06-19 LAB — MAGNESIUM: Magnesium: 2.1 mg/dL (ref 1.5–2.5)

## 2011-06-19 NOTE — Assessment & Plan Note (Signed)
Mr Sissel is a pleasant 60 yo obese male with OSA and symptomatic persistent atrial fibrillation.  He has previously been treated with tikosyn.  He has had reasonable control with tikosyn. Therapeutic strategies for afib including medicine and ablation were discussed in detail with the patient today. Risk, benefits, and alternatives to EP study and radiofrequency ablation for afib were also discussed in detail today. These risks include but are not limited to stroke, bleeding, vascular damage, tamponade, perforation, damage to the esophagus, lungs, and other structures, pulmonary vein stenosis, worsening renal function, and death.   He is aware that given his morbid obesity, anticipated success rates with ablation (60%) are reduced and risks would be increased.  I have therefore recommended strongly that he lose weight.  If we can get his weight under 300 lbs, then efficacy and safety from ablation would be more reasonable.  At this time,  He wishes to continue his current medicine strategy with additional efforts to lose weight.  He will follow-up with Dr [name] and return to my office if he wishes to further discuss afib ablation in the future.

## 2011-06-19 NOTE — Progress Notes (Signed)
[name] [name] is a pleasant 60 y.o. yo patient with a h/o persistent atrial fibrillation who presents today for EP consultation.  He reports initially being diagnosed with atrial fibrillation in 2005.  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  He developed afib and required cardioversion.  He states that 7/07 he developed afib while doing yard work.  He was given a single dose of flecainide and converted to sinus rhythm. He did well until 11/11 when he developed recurrent afib at home while sitting in his chair reading the news paper.  He was cardioverted 11/14/10.  10 days later, he developed recurrent afib.  He was initiated on Tikosyn 12/11.  He reports doing well until 05/18/11 when he again developed afib for which he was again cardioverted.  He is unware of triggers/ precipitants for his afib.  During afib, he reports symptoms of palpitations and decreased exercise tolerance.  Today, he denies symptoms of palpitations, chest pain, shortness of breath, orthopnea, PND, lower extremity edema, dizziness, presyncope, syncope, or neurologic sequela. The patient is tolerating medications without difficulties and is otherwise without complaint today.   Past Medical History  Diagnosis Date  . HYPERLIPIDEMIA 03/22/2009  . Obesity, unspecified 04/24/2009  . THROMBOCYTOPENIA 08/16/2010  . ERECTILE DYSFUNCTION 03/22/2009  . SLEEP APNEA, OBSTRUCTIVE 03/22/2009    compliant with CPAP  . 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 Coronary atherosclerosis of native coronary artery 11/2002    a. s/p stent to LAD 12/03; OM2 occluded at cath 12/03; d. myoview 5/10: no ischemia;  e. echo 7/11: EF 55%, BAE, mild RVE, PASP 41-45  . Atrial fibrillation 03/22/2009    a. s/p multiple DCCV; b. no coumadin due to low TE risk profile; c. Tikosyn Rx  . GERD 03/22/2009  . LATERAL EPICONDYLITIS, LEFT 10/24/2009  . HYPERGLYCEMIA 04/25/2010  . LIVER  FUNCTION TESTS, ABNORMAL 04/25/2010  . TOBACCO USE, QUIT 10/24/2009  . Umbilical hernia    Past Surgical History  Procedure Date  . Cyst on epiglottis 08/2002  . Surgery l4-l5  1998    ruptured x 3  . Stent     LAD DUS 2004  . Ulnar neuropathy   . Umbilical hernia repair     Current Outpatient Prescriptions  Medication Sig Dispense Refill  . aspirin 325 MG tablet Take 325 mg by mouth daily.        [name] atorvastatin (LIPITOR) 80 MG tablet Take 1 tablet (80 mg total) by mouth daily.  90 tablet  3  . dofetilide (TIKOSYN) 500 MCG capsule Take 500 mcg by mouth 2 (two) times daily.        . famotidine (PEPCID) 10 MG tablet Take 10 mg by mouth as needed.       . fish oil-omega-3 fatty acids 1000 MG capsule Take 2 g by mouth daily.        . magnesium gluconate (MAGONATE) 500 MG tablet Pt takes OTC MAGNESIUM 400 mg daily      . MULTIPLE VITAMIN PO Take by mouth.        . nitroGLYCERIN (NITROLINGUAL) 0.4 MG/SPRAY spray Place 1 spray under the tongue every 5 (five) minutes as needed.        . pantoprazole (PROTONIX) 40 MG tablet Take 1 tablet (40 mg total) by mouth daily.  90 tablet  3  . potassium chloride SA (K-DUR,KLOR-CON) 20 MEQ tablet Take 20 mEq by mouth  daily.        . zolpidem (AMBIEN) 10 MG tablet Take 1 tablet (10 mg total) by mouth at bedtime as needed.  30 tablet  5    Allergies  Allergen Reactions  . Penicillins   . Procaine Hcl     REACTION: Reaction not known  . Pseudoephedrine     REACTION: Reaction not known  . Sulfonamide Derivatives     History   Social History  . Marital Status: Married    Spouse Name: N/A    Number of Children: N/A  . Years of Education: N/A   Occupational History  . Not on file.   Social History Main Topics  . Smoking status: Former Smoker    Quit date: 12/30/2008  . Smokeless tobacco: Not on file   Comment: started at age 60; 1-2 ppd; quit 2010  . Alcohol Use: Yes     socially  . Drug Use: No  . Sexually Active: Not on file    Other Topics Concern  . 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    Family History  Problem Relation Age of Onset  . Thyroid disease Mother   . Ovarian cancer Mother   . Lung cancer Father     ROS- All systems are reviewed and negative except as per the HPI above  Physical Exam: Filed Vitals:   06/19/11 1023  BP: 124/74  Pulse: 54  Height: 6\' 2"  (1.88 m)  Weight: 344 lb (156.037 kg)    GEN- The patient is morbidly obese, alert and oriented x 3 today.   Head- normocephalic, atraumatic Eyes-  Sclera clear, conjunctiva pink Ears- hearing intact Oropharynx- clear Neck- supple, no JVP Lymph- no cervical lymphadenopathy Lungs- Clear to ausculation bilaterally, normal work of breathing Heart- Regular rate and rhythm, no murmurs, rubs or gallops, PMI not laterally displaced GI- soft, NT, ND, + BS Extremities- no clubbing, cyanosis, trace edema MS- no significant deformity or atrophy Skin- no rash or lesion Psych- euthymic mood, full affect Neuro- strength and sensation are intact  EKG today reveals sinus bradycardia 54 bm, Qtc 424, otherwise normal ekg  Assessment and Plan:

## 2011-06-19 NOTE — Assessment & Plan Note (Signed)
No ischemic symptoms Continue medical therapy 

## 2011-06-19 NOTE — Assessment & Plan Note (Signed)
Weight loss and compliance with CPAP are advised

## 2011-06-19 NOTE — Assessment & Plan Note (Signed)
The importance of weight loss was discussed at length today

## 2011-06-25 ENCOUNTER — Other Ambulatory Visit: Payer: Self-pay | Admitting: Cardiovascular Disease

## 2011-06-26 ENCOUNTER — Other Ambulatory Visit: Payer: Self-pay | Admitting: *Deleted

## 2011-06-26 NOTE — Telephone Encounter (Signed)
Not dr Namon Cirri pt, pharmacy called and informed he is a pt of dr allred.

## 2011-06-28 ENCOUNTER — Other Ambulatory Visit: Payer: Self-pay | Admitting: Cardiology

## 2011-07-02 NOTE — Progress Notes (Signed)
Addended by: Judithe Modest D on: 07/02/2011 12:46 PM   Modules accepted: Orders

## 2011-07-09 ENCOUNTER — Telehealth: Payer: Self-pay | Admitting: Cardiology

## 2011-07-09 ENCOUNTER — Ambulatory Visit (INDEPENDENT_AMBULATORY_CARE_PROVIDER_SITE_OTHER): Payer: 59

## 2011-07-09 VITALS — BP 123/81 | HR 123 | Resp 20 | Ht 74.0 in | Wt 336.0 lb

## 2011-07-09 DIAGNOSIS — I4891 Unspecified atrial fibrillation: Secondary | ICD-10-CM

## 2011-07-09 LAB — BASIC METABOLIC PANEL
BUN: 14 mg/dL (ref 6–23)
CO2: 25 mEq/L (ref 19–32)
Calcium: 9.3 mg/dL (ref 8.4–10.5)
Chloride: 109 mEq/L (ref 96–112)
Creatinine, Ser: 0.9 mg/dL (ref 0.4–1.5)
GFR: 93.37 mL/min (ref >60.00)
Glucose, Bld: 150 mg/dL — ABNORMAL HIGH (ref 70–99)
Potassium: 3.9 mEq/L (ref 3.5–5.1)
Sodium: 142 mEq/L (ref 135–145)

## 2011-07-09 LAB — MAGNESIUM: Magnesium: 2.3 mg/dL (ref 1.5–2.5)

## 2011-07-09 NOTE — Telephone Encounter (Addendum)
Pt in a-fib for last 30 min, having palpitations [phone]

## 2011-07-09 NOTE — Telephone Encounter (Signed)
Patient states is in A-fib. He feels like he is having PVC's but he said is a fib. He denies SOB an any other symptoms. He states the last time he was in a-fib was May 19 th this year, and the only thing it helps is having a cardioversion. Patient was seen by Dr. Johney Frame on 06/19/11 for  The same problem.  Pt is taken Tikesyn 500 mcg two times daily. Pt. Took first dose at 7:00 AM today. An appointment was made for pt. For an EKG this AM. Patient aware.

## 2011-07-09 NOTE — Progress Notes (Signed)
Patient in for an EKG. Pt. Is  In A fib starting early this AM. Pt. C/O of SOB with moderate exception. Patient denies any other symptoms  EKG done ,read by Dr. Graciela Husbands DOD. Pt's B/P  123/81 pulse 123 beats/ min RR 20/min. Dr. Graciela Husbands recommends for patient to be NPO after MN tonight. Pt. To call the office in the  Am if he continues to be  in A-Fib. Pt will need to  Be  schedule for cardioversion the same day. Labs BMET and Magnesium level  done today. Written instructions for cardioversion given to pt.

## 2011-07-09 NOTE — Patient Instructions (Addendum)
Patient to call in the AM in continues to be  in A-fib. And ask to speak with the triage nurse.  Nothing to eat after Mn except medication with water, for possible cardioversion in AM. Labs today. BMET, and magnesium level.

## 2011-07-10 ENCOUNTER — Telehealth: Payer: Self-pay | Admitting: *Deleted

## 2011-07-10 ENCOUNTER — Ambulatory Visit (HOSPITAL_COMMUNITY)
Admission: RE | Admit: 2011-07-10 | Discharge: 2011-07-10 | Disposition: A | Discharge status: Home / Self Care | Payer: 59 | Source: Ambulatory Visit | Attending: Cardiovascular Disease | Admitting: Cardiovascular Disease

## 2011-07-10 DIAGNOSIS — I4891 Unspecified atrial fibrillation: Secondary | ICD-10-CM | POA: Insufficient documentation

## 2011-07-10 NOTE — Telephone Encounter (Signed)
Pt calling stating he is to have cardioversion today--spoke with pt who states he is NPO, has had labs drawn and wants to have cardioversion today--called and scheduled with endoscopy and told pt to present at short stay ASAP--spoke with dr allred and dr stuckey--advised dr Graciela Husbands to call dr Riley Kill before procedure as pt is on no anticoags, and dr Riley Kill wants to be sure pt has been in a fib no longer than 48 hours--dr kline states he will call dr stuckey--nt

## 2011-07-16 NOTE — Telephone Encounter (Signed)
Patient was seen by Dr. Ladona Ridgel at the hospital.  TS

## 2011-08-05 ENCOUNTER — Other Ambulatory Visit (INDEPENDENT_AMBULATORY_CARE_PROVIDER_SITE_OTHER): Payer: 59

## 2011-08-05 DIAGNOSIS — I4891 Unspecified atrial fibrillation: Secondary | ICD-10-CM

## 2011-08-05 DIAGNOSIS — R002 Palpitations: Secondary | ICD-10-CM

## 2011-08-05 DIAGNOSIS — E119 Type 2 diabetes mellitus without complications: Secondary | ICD-10-CM

## 2011-08-05 LAB — MAGNESIUM: Magnesium: 2.1 mg/dL (ref 1.5–2.5)

## 2011-08-05 LAB — HEMOGLOBIN A1C: Hgb A1c MFr Bld: 6.3 % (ref 4.6–6.5)

## 2011-08-19 ENCOUNTER — Encounter: Payer: Self-pay | Admitting: *Deleted

## 2011-08-20 ENCOUNTER — Encounter: Payer: Self-pay | Admitting: Internal Medicine

## 2011-08-20 NOTE — Telephone Encounter (Signed)
Addended by: Scherrie Bateman E on: 08/20/2011 05:27 PM   Modules accepted: Orders

## 2011-08-21 NOTE — Progress Notes (Signed)
Pt aware of results. He will call back to schedule a follow up appointment.

## 2011-09-09 ENCOUNTER — Ambulatory Visit (INDEPENDENT_AMBULATORY_CARE_PROVIDER_SITE_OTHER): Payer: 59 | Admitting: Cardiology

## 2011-09-09 ENCOUNTER — Encounter: Payer: Self-pay | Admitting: Cardiology

## 2011-09-09 DIAGNOSIS — I2510 Atherosclerotic heart disease of native coronary artery without angina pectoris: Secondary | ICD-10-CM

## 2011-09-09 DIAGNOSIS — I4891 Unspecified atrial fibrillation: Secondary | ICD-10-CM

## 2011-09-09 DIAGNOSIS — E785 Hyperlipidemia, unspecified: Secondary | ICD-10-CM

## 2011-09-09 MED ORDER — NITROGLYCERIN 0.4 MG SL SUBL: 0.4000 mg | SUBLINGUAL_TABLET | SUBLINGUAL | Status: DC | PRN

## 2011-09-09 NOTE — Assessment & Plan Note (Signed)
Continue tikosyn. CHADS vasc score 1. Continue ASA. Consider ablation in the future if he loses weight.

## 2011-09-09 NOTE — Assessment & Plan Note (Signed)
Continue statin. 

## 2011-09-09 NOTE — Patient Instructions (Signed)
Your physician wants you to follow-up in:  6 months. You will receive a reminder letter in the mail two months in advance. If you don't receive a letter, please call our office to schedule the follow-up appointment.   

## 2011-09-09 NOTE — Progress Notes (Signed)
HPI:[name] is a pleasant gentleman who has a history of coronary artery disease status post stent to his LAD in December 2003 as well as paroxysmal atrial fibrillation. Last Myoview was in May of 2010. There was extensive soft tissue attenuation but no ischemia or infarction. The calculated ejection fraction was 48% but visually appeared better. Admitted with frequent PVCs in July 2011. Noted to be bradycardic and beta blocker changed to p.r.n. TSH and enzymes were normal. Last echocardiogram in July 2011 showed normal LV function, mild biatrial enlargement and mild right ventricular enlargement. Patient on Tikosyn for afib. Recurrent episode in May and June of 2012. Saw Dr. Johney Frame in F/U and AFib ablation could be considered if patient lost weight.  Since he was last seen, the patient denies any dyspnea on exertion, orthopnea, PND, pedal edema, palpitations, syncope or chest pain.   Current Outpatient Prescriptions  Medication Sig Dispense Refill  . aspirin 325 MG tablet Take 325 mg by mouth daily.        [name] atorvastatin (LIPITOR) 80 MG tablet Take 1 tablet (80 mg total) by mouth daily.  90 tablet  3  . famotidine (PEPCID) 10 MG tablet Take 10 mg by mouth as needed.       . fish oil-omega-3 fatty acids 1000 MG capsule Take 2 g by mouth daily.        [name] KLOR-CON M20 20 MEQ tablet TAKE 1 TABLET BY MOUTH EVERY DAY  30 tablet  6  . magnesium gluconate (MAGONATE) 500 MG tablet Pt takes OTC MAGNESIUM 400 mg daily      . MULTIPLE VITAMIN PO Take by mouth.        . nitroGLYCERIN (NITROLINGUAL) 0.4 MG/SPRAY spray Place 1 spray under the tongue every 5 (five) minutes as needed.        . pantoprazole (PROTONIX) 40 MG tablet Take 1 tablet (40 mg total) by mouth daily.  90 tablet  3  . TIKOSYN 500 MCG capsule TAKE 1 CAPSULE TWICE A DAY  60 capsule  6  . zolpidem (AMBIEN) 10 MG tablet Take 1 tablet (10 mg total) by mouth at bedtime as needed.  30 tablet  5     Past Medical History  Diagnosis Date  .  HYPERLIPIDEMIA 03/22/2009  . Obesity, unspecified 04/24/2009  . THROMBOCYTOPENIA 08/16/2010  . ERECTILE DYSFUNCTION 03/22/2009  . SLEEP APNEA, OBSTRUCTIVE 03/22/2009    compliant with CPAP  . 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 Coronary atherosclerosis of native coronary artery 11/2002    a. s/p stent to LAD 12/03; OM2 occluded at cath 12/03; d. myoview 5/10: no ischemia;  e. echo 7/11: EF 55%, BAE, mild RVE, PASP 41-45  . Atrial fibrillation 03/22/2009    a. s/p multiple DCCV; b. no coumadin due to low TE risk profile; c. Tikosyn Rx  . GERD 03/22/2009  . LATERAL EPICONDYLITIS, LEFT 10/24/2009  . HYPERGLYCEMIA 04/25/2010  . LIVER FUNCTION TESTS, ABNORMAL 04/25/2010  . TOBACCO USE, QUIT 10/24/2009  . Umbilical hernia     Past Surgical History  Procedure Date  . Cyst on epiglottis 08/2002  . Surgery l4-l5  1998    ruptured x 3  . Stent     LAD DUS 2004  . Ulnar neuropathy   . Umbilical hernia repair     History   Social History  . Marital Status: Married    Spouse Name: N/A    Number of Children: N/A  . Years of Education: N/A   Occupational History  .  Not on file.   Social History Main Topics  . Smoking status: Former Smoker    Quit date: 12/30/2008  . Smokeless tobacco: Not on file   Comment: started at age 17; 1-2 ppd; quit 2010  . Alcohol Use: Yes     socially  . Drug Use: No  . Sexually Active: Not on file   Other Topics Concern  . 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    ROS: no fevers or chills, productive cough, hemoptysis, dysphasia, odynophagia, melena, hematochezia, dysuria, hematuria, rash, seizure activity, orthopnea, PND, pedal edema, claudication. Remaining systems are negative.  Physical Exam: Well-developed obese in no acute distress.  Skin is warm and dry.  HEENT is normal.  Neck is supple. No thyromegaly.    Chest is clear to auscultation with normal expansion.  Cardiovascular exam is regular rate and rhythm.  Abdominal exam nontender or distended. No masses palpated. Extremities show no edema. neuro grossly intact

## 2011-09-09 NOTE — Assessment & Plan Note (Signed)
Continue aspirin and statin. 

## 2011-09-19 LAB — DIFFERENTIAL
Basophils Absolute: 0
Basophils Relative: 0
Eosinophils Absolute: 0.7
Eosinophils Relative: 8 — ABNORMAL HIGH
Lymphocytes Relative: 25
Lymphs Abs: 2.4
Monocytes Absolute: 0.6
Monocytes Relative: 6
Neutro Abs: 5.9
Neutrophils Relative %: 62

## 2011-09-19 LAB — COMPREHENSIVE METABOLIC PANEL
ALT: 41
AST: 25
Albumin: 3.8
Alkaline Phosphatase: 74
BUN: 10
CO2: 27
Calcium: 9.4
Chloride: 104
Creatinine, Ser: 0.8
GFR calc Af Amer: >60
GFR calc non Af Amer: >60
Glucose, Bld: 124 — ABNORMAL HIGH
Potassium: 3.9
Sodium: 138
Total Bilirubin: 1.3 — ABNORMAL HIGH
Total Protein: 6.7

## 2011-09-19 LAB — CBC
HCT: 46.7
Hemoglobin: 16.1
MCHC: 34.5
MCV: 90.6
Platelets: 176
RBC: 5.15
RDW: 13.5
WBC: 9.6

## 2011-09-19 LAB — PROTIME-INR
INR: 1.7 — ABNORMAL HIGH
Prothrombin Time: 20.4 — ABNORMAL HIGH

## 2011-09-19 LAB — POCT CARDIAC MARKERS
CKMB, poc: <1 — ABNORMAL LOW
CKMB, poc: <1 — ABNORMAL LOW
Myoglobin, poc: 73.5
Myoglobin, poc: 76.3
Operator id: 198171
Operator id: 198171
Troponin i, poc: <0.05
Troponin i, poc: <0.05

## 2011-09-19 LAB — LIPASE, BLOOD: Lipase: 31

## 2011-10-09 ENCOUNTER — Ambulatory Visit (INDEPENDENT_AMBULATORY_CARE_PROVIDER_SITE_OTHER): Payer: 59 | Admitting: Internal Medicine

## 2011-10-09 ENCOUNTER — Encounter: Payer: Self-pay | Admitting: Internal Medicine

## 2011-10-09 VITALS — BP 120/72 | HR 66 | Wt 331.0 lb

## 2011-10-09 DIAGNOSIS — Z86010 Personal history of colonic polyps: Secondary | ICD-10-CM

## 2011-10-09 DIAGNOSIS — I4891 Unspecified atrial fibrillation: Secondary | ICD-10-CM

## 2011-10-09 DIAGNOSIS — R7309 Other abnormal glucose: Secondary | ICD-10-CM

## 2011-10-09 DIAGNOSIS — I2510 Atherosclerotic heart disease of native coronary artery without angina pectoris: Secondary | ICD-10-CM

## 2011-10-09 DIAGNOSIS — Z23 Encounter for immunization: Secondary | ICD-10-CM

## 2011-10-09 DIAGNOSIS — E669 Obesity, unspecified: Secondary | ICD-10-CM

## 2011-10-09 LAB — GLUCOSE, POCT (MANUAL RESULT ENTRY): POC Glucose: 162

## 2011-10-09 NOTE — Assessment & Plan Note (Signed)
Better   a1c . Doing modified  Atkins.

## 2011-10-09 NOTE — Patient Instructions (Signed)
Continue lower carb diet  And weight loss. Check up in  April 2013   With labs

## 2011-10-09 NOTE — Assessment & Plan Note (Signed)
RR today 

## 2011-10-09 NOTE — Assessment & Plan Note (Signed)
Continue lifestyle intervention healthy eating and exercise . Lose weight Counseled.

## 2011-10-09 NOTE — Progress Notes (Signed)
  Subjective:    Patient ID: [name] [name], male    DOB: [birth year], 60 y.o.   MRN: [id number]  HPI  No major change in health status since last visit .However he was hospitalized x 2  Hx of af recurrent  X 2  May  And had cardioversion and  Had added potassium and mg   tikosyn having a problem.  Ablation not  Option unless 300 pounds.  Has been trying to lose weight with modified Renaldo Fiddler.  Bp stable  No infections no bleeding   To have colon repeat for poss atypical polyp  But may need to reschedule.  Review of Systems Neg cp sob fever infections or eye changes Past Medical History  Diagnosis Date  . HYPERLIPIDEMIA 03/22/2009  . Obesity, unspecified 04/24/2009  . THROMBOCYTOPENIA 08/16/2010  . ERECTILE DYSFUNCTION 03/22/2009  . SLEEP APNEA, OBSTRUCTIVE 03/22/2009    compliant with CPAP  . 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 Coronary atherosclerosis of native coronary artery 11/2002    a. s/p stent to LAD 12/03; OM2 occluded at cath 12/03; d. myoview 5/10: no ischemia;  e. echo 7/11: EF 55%, BAE, mild RVE, PASP 41-45  . Atrial fibrillation 03/22/2009    a. s/p multiple DCCV; b. no coumadin due to low TE risk profile; c. Tikosyn Rx  . GERD 03/22/2009  . LATERAL EPICONDYLITIS, LEFT 10/24/2009  . HYPERGLYCEMIA 04/25/2010  . LIVER FUNCTION TESTS, ABNORMAL 04/25/2010  . TOBACCO USE, QUIT 10/24/2009  . Umbilical hernia    Past Surgical History  Procedure Date  . Cyst on epiglottis 08/2002  . Surgery l4-l5  1998    ruptured x 3  . Stent     LAD DUS 2004  . Ulnar neuropathy   . Umbilical hernia repair     reports that he quit smoking about 2 years ago. He does not have any smokeless tobacco history on file. He reports that he drinks alcohol. He reports that he does not use illicit drugs. family history includes Lung cancer in his father; Ovarian cancer in his mother; and Thyroid disease in his mother. Allergies  Allergen Reactions  . Penicillins   . Procaine Hcl     REACTION:  Reaction not known  . Pseudoephedrine     REACTION: Reaction not known  . Sulfonamide Derivatives        Objective:   Physical Exam WD WN in nad looks well. Neck: Supple without adenopathy or masses or bruits Chest:  Clear to A&P without wheezes rales or rhonchi CV:  S1-S2 no gallops or murmurs peripheral perfusion is normal Abdomen:   Large Sof,t normal bowel sounds without mass  Liver at rcm , no guarding rebound or masses no CVA tenderness Skin: normal capillary refill ,turgor , color: No acute rashes ,petechiae or bruising Labs reviewed with patient. Lab Results  Component Value Date   HGBA1C 6.3 08/05/2011   Pp bg 162     Assessment & Plan:    Total visit > 50% spent counseling and coordinating care

## 2011-10-11 ENCOUNTER — Ambulatory Visit (HOSPITAL_COMMUNITY): Admission: RE | Admit: 2011-10-11 | Payer: 59 | Source: Ambulatory Visit | Admitting: Gastroenterology

## 2011-11-15 ENCOUNTER — Other Ambulatory Visit: Payer: Self-pay | Admitting: Gastroenterology

## 2011-11-15 ENCOUNTER — Encounter (HOSPITAL_COMMUNITY)
Admission: RE | Disposition: A | Discharge status: Home / Self Care | Payer: Self-pay | Source: Ambulatory Visit | Attending: Gastroenterology

## 2011-11-15 ENCOUNTER — Encounter (HOSPITAL_COMMUNITY): Payer: Self-pay | Admitting: *Deleted

## 2011-11-15 ENCOUNTER — Ambulatory Visit (HOSPITAL_COMMUNITY)
Admission: RE | Admit: 2011-11-15 | Discharge: 2011-11-15 | Disposition: A | Discharge status: Home / Self Care | Payer: 59 | Source: Ambulatory Visit | Attending: Gastroenterology | Admitting: Gastroenterology

## 2011-11-15 DIAGNOSIS — K648 Other hemorrhoids: Secondary | ICD-10-CM | POA: Insufficient documentation

## 2011-11-15 DIAGNOSIS — D126 Benign neoplasm of colon, unspecified: Secondary | ICD-10-CM | POA: Insufficient documentation

## 2011-11-15 DIAGNOSIS — R1013 Epigastric pain: Secondary | ICD-10-CM | POA: Insufficient documentation

## 2011-11-15 DIAGNOSIS — I498 Other specified cardiac arrhythmias: Secondary | ICD-10-CM | POA: Insufficient documentation

## 2011-11-15 DIAGNOSIS — K625 Hemorrhage of anus and rectum: Secondary | ICD-10-CM | POA: Insufficient documentation

## 2011-11-15 DIAGNOSIS — K5730 Diverticulosis of large intestine without perforation or abscess without bleeding: Secondary | ICD-10-CM | POA: Insufficient documentation

## 2011-11-15 DIAGNOSIS — I4891 Unspecified atrial fibrillation: Secondary | ICD-10-CM | POA: Insufficient documentation

## 2011-11-15 DIAGNOSIS — K219 Gastro-esophageal reflux disease without esophagitis: Secondary | ICD-10-CM | POA: Insufficient documentation

## 2011-11-15 HISTORY — PX: COLONOSCOPY: SHX5424

## 2011-11-15 HISTORY — PX: ESOPHAGOGASTRODUODENOSCOPY: SHX5428

## 2011-11-15 HISTORY — DX: Acute myocardial infarction, unspecified: I21.9

## 2011-11-15 LAB — HM COLONOSCOPY

## 2011-11-15 SURGERY — EGD (ESOPHAGOGASTRODUODENOSCOPY)
Anesthesia: Moderate Sedation

## 2011-11-15 MED ORDER — SODIUM CHLORIDE 0.9 % IV SOLN
Freq: Once | INTRAVENOUS | Status: AC
  Administered 2011-11-15: 500 mL via INTRAVENOUS

## 2011-11-15 MED ORDER — MIDAZOLAM HCL 10 MG/2ML IJ SOLN
INTRAMUSCULAR | Status: AC
  Filled 2011-11-15: qty 4

## 2011-11-15 MED ORDER — FENTANYL CITRATE 0.05 MG/ML IJ SOLN
INTRAMUSCULAR | Status: AC
  Filled 2011-11-15: qty 4

## 2011-11-15 MED ORDER — MIDAZOLAM HCL 5 MG/5ML IJ SOLN: INTRAMUSCULAR | Status: DC | PRN

## 2011-11-15 MED ORDER — LIDOCAINE VISCOUS 2 % MT SOLN
OROMUCOSAL | Status: AC
  Filled 2011-11-15: qty 15

## 2011-11-15 MED ORDER — FENTANYL NICU IV SYRINGE 50 MCG/ML
INJECTION | INTRAMUSCULAR | Status: DC | PRN
  Administered 2011-11-15 (×5): 25 ug via INTRAVENOUS

## 2011-11-15 MED ORDER — MIDAZOLAM HCL 10 MG/2ML IJ SOLN
INTRAMUSCULAR | Status: DC | PRN
  Administered 2011-11-15: 1 mg via INTRAVENOUS
  Administered 2011-11-15 (×3): 2 mg via INTRAVENOUS

## 2011-11-15 NOTE — H&P (Signed)
See H&P from 09/10/2011; patient is having an EGD and a colonoscopy for rectal bleeding. He also has a history of epigastric pain but denies a history of dysphagia, odynophagia, nausea or vomiting. He is on H2 blockers and PPI's for his reflux. On his previous colonoscopy he was found to have scattered diverticulosis and has had tubular adenoma removed in the past.

## 2011-11-18 ENCOUNTER — Encounter (HOSPITAL_COMMUNITY): Payer: Self-pay

## 2011-11-25 ENCOUNTER — Encounter (HOSPITAL_COMMUNITY): Payer: Self-pay | Admitting: Gastroenterology

## 2011-12-20 ENCOUNTER — Telehealth: Payer: Self-pay | Admitting: *Deleted

## 2011-12-20 NOTE — Telephone Encounter (Signed)
Refill on zolpidem 10mg  last filled 10/21/11 #30

## 2011-12-22 NOTE — Telephone Encounter (Signed)
Ok to refill x 1  

## 2011-12-23 ENCOUNTER — Telehealth: Payer: Self-pay | Admitting: *Deleted

## 2011-12-23 MED ORDER — ZOLPIDEM TARTRATE 10 MG PO TABS: 10.0000 mg | ORAL_TABLET | Freq: Every evening | ORAL | Status: DC | PRN

## 2011-12-23 NOTE — Telephone Encounter (Signed)
See other phone call.

## 2011-12-23 NOTE — Telephone Encounter (Signed)
Rx called in 

## 2012-01-20 ENCOUNTER — Ambulatory Visit (INDEPENDENT_AMBULATORY_CARE_PROVIDER_SITE_OTHER): Payer: 59 | Admitting: Pulmonary Disease

## 2012-01-20 ENCOUNTER — Encounter: Payer: Self-pay | Admitting: Pulmonary Disease

## 2012-01-20 VITALS — BP 132/72 | HR 61 | Temp 98.1°F | Ht 73.0 in | Wt 350.6 lb

## 2012-01-20 DIAGNOSIS — G4733 Obstructive sleep apnea (adult) (pediatric): Secondary | ICD-10-CM

## 2012-01-20 NOTE — Assessment & Plan Note (Signed)
The pt is doing well with cpap, and reports no issues with mask or machine.  I have asked him to keep up with supplies/mask exchanges, and to work aggressively on weight loss. He will see me back in one year.

## 2012-01-20 NOTE — Progress Notes (Signed)
  Subjective:    Patient ID: [name] [name], male    DOB: July 28, 1951, 61 y.o.   MRN: [id number]  HPI Patient comes in today for followup of his known obstructive sleep apnea.  He is wearing CPAP compliantly, and reports no issues with his mask or pressure.  He feels that he is sleeping well, and denies any sleepiness during the day with periods of inactivity.  He has lost 5 pounds since the last visit, and I have encouraged him to continue.   Review of Systems  Constitutional: Negative for fever and unexpected weight change.  HENT: Negative for ear pain, nosebleeds, congestion, sore throat, rhinorrhea, sneezing, trouble swallowing, dental problem, postnasal drip and sinus pressure.   Eyes: Positive for redness. Negative for itching.  Respiratory: Positive for cough. Negative for chest tightness, shortness of breath and wheezing.   Cardiovascular: Negative for palpitations and leg swelling.  Gastrointestinal: Negative for nausea and vomiting.  Genitourinary: Negative for dysuria.  Musculoskeletal: Negative for joint swelling.  Skin: Positive for rash.  Neurological: Negative for headaches.  Hematological: Does not bruise/bleed easily.  Psychiatric/Behavioral: Negative for dysphoric mood. The patient is nervous/anxious.        Objective:   Physical Exam Obese male in nad No skin breakdown or pressure necrosis from cpap mask LE with mild edema, no cyanosis Alert and oriented,moves all 4.        Assessment & Plan:

## 2012-01-20 NOTE — Patient Instructions (Signed)
Continue with mask changes, keep up with supplies Work on weight loss followup with me in one year.

## 2012-01-23 ENCOUNTER — Other Ambulatory Visit: Payer: Self-pay | Admitting: Cardiology

## 2012-01-23 ENCOUNTER — Other Ambulatory Visit: Payer: Self-pay

## 2012-01-23 MED ORDER — DOFETILIDE 500 MCG PO CAPS: 500.0000 ug | ORAL_CAPSULE | Freq: Two times a day (BID) | ORAL | Status: DC

## 2012-01-23 MED ORDER — POTASSIUM CHLORIDE CRYS ER 20 MEQ PO TBCR: 20.0000 meq | EXTENDED_RELEASE_TABLET | Freq: Every day | ORAL | Status: DC

## 2012-01-24 ENCOUNTER — Other Ambulatory Visit: Payer: Self-pay | Admitting: *Deleted

## 2012-01-24 NOTE — Telephone Encounter (Signed)
Spoke to pt and pharmacy and med has been picked up

## 2012-03-10 ENCOUNTER — Ambulatory Visit: Payer: 59 | Admitting: Hematology and Oncology

## 2012-03-10 ENCOUNTER — Other Ambulatory Visit: Payer: 59 | Admitting: Lab

## 2012-03-11 ENCOUNTER — Ambulatory Visit (INDEPENDENT_AMBULATORY_CARE_PROVIDER_SITE_OTHER): Payer: 59 | Admitting: Cardiology

## 2012-03-11 ENCOUNTER — Encounter: Payer: Self-pay | Admitting: Cardiology

## 2012-03-11 VITALS — BP 134/75 | HR 64 | Ht 73.0 in | Wt 351.0 lb

## 2012-03-11 DIAGNOSIS — I4891 Unspecified atrial fibrillation: Secondary | ICD-10-CM

## 2012-03-11 DIAGNOSIS — E785 Hyperlipidemia, unspecified: Secondary | ICD-10-CM

## 2012-03-11 DIAGNOSIS — I2510 Atherosclerotic heart disease of native coronary artery without angina pectoris: Secondary | ICD-10-CM

## 2012-03-11 NOTE — Assessment & Plan Note (Addendum)
Patient remains in sinus rhythm. Continue Tikosyn. CHADS score is 0. Continue aspirin for coronary disease. Patient to have potassium, magnesium and renal function checked in April by primary care.

## 2012-03-11 NOTE — Assessment & Plan Note (Signed)
Continue statin. Lipids and liver monitored by primary care. 

## 2012-03-11 NOTE — Progress Notes (Signed)
HPI: [name] [name] is a pleasant gentleman who has a history of coronary artery disease status post stent to his LAD in December 2003 as well as paroxysmal atrial fibrillation. Last Myoview was in May of 2010. There was extensive soft tissue attenuation but no ischemia or infarction. The calculated ejection fraction was 48% but visually appeared better. Admitted with frequent PVCs in July 2011. Noted to be bradycardic and beta blocker changed to p.r.n. TSH and enzymes were normal. Last echocardiogram in July 2011 showed normal LV function, mild biatrial enlargement and mild right ventricular enlargement. Patient on Tikosyn for afib. Recurrent episode in May and June of 2012. Saw Dr. Johney Frame in F/U and AFib ablation could be considered if patient lost weight. Since he was last seen in Sept 2012, the patient denies any dyspnea on exertion, orthopnea, PND, pedal edema, palpitations, syncope or chest pain.   Current Outpatient Prescriptions  Medication Sig Dispense Refill  . aspirin 325 MG tablet Take 325 mg by mouth daily.      [name] atorvastatin (LIPITOR) 80 MG tablet Take 1 tablet (80 mg total) by mouth daily.  90 tablet  3  . dofetilide (TIKOSYN) 500 MCG capsule Take 1 capsule (500 mcg total) by mouth 2 (two) times daily.  60 capsule  6  . famotidine (PEPCID) 10 MG tablet Take 10 mg by mouth as needed.       . fish oil-omega-3 fatty acids 1000 MG capsule Take 2 g by mouth daily.        . magnesium gluconate (MAGONATE) 500 MG tablet Pt takes OTC MAGNESIUM 400 mg daily      . MULTIPLE VITAMIN PO Take 1 tablet by mouth daily.       . nitroGLYCERIN (NITROSTAT) 0.4 MG SL tablet Place 1 tablet (0.4 mg total) under the tongue every 5 (five) minutes as needed for chest pain.  25 tablet  3  . pantoprazole (PROTONIX) 40 MG tablet Take 1 tablet (40 mg total) by mouth daily.  90 tablet  3  . potassium chloride SA (KLOR-CON M20) 20 MEQ tablet Take 1 tablet (20 mEq total) by mouth daily.  30 tablet  5  . zolpidem  (AMBIEN) 10 MG tablet Take 1 tablet (10 mg total) by mouth at bedtime as needed.  30 tablet  0     Past Medical History  Diagnosis Date  . HYPERLIPIDEMIA 03/22/2009  . Obesity, unspecified 04/24/2009  . THROMBOCYTOPENIA 08/16/2010  . ERECTILE DYSFUNCTION 03/22/2009  . SLEEP APNEA, OBSTRUCTIVE 03/22/2009    compliant with CPAP  . 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 Coronary atherosclerosis of native coronary artery 11/2002    a. s/p stent to LAD 12/03; OM2 occluded at cath 12/03; d. myoview 5/10: no ischemia;  e. echo 7/11: EF 55%, BAE, mild RVE, PASP 41-45  . Atrial fibrillation 03/22/2009    a. s/p multiple DCCV; b. no coumadin due to low TE risk profile; c. Tikosyn Rx  . GERD 03/22/2009  . LATERAL EPICONDYLITIS, LEFT 10/24/2009  . HYPERGLYCEMIA 04/25/2010  . LIVER FUNCTION TESTS, ABNORMAL 04/25/2010  . TOBACCO USE, QUIT 10/24/2009  . Umbilical hernia   . Myocardial infarction mi2003    Past Surgical History  Procedure Date  . Cyst on epiglottis 08/2002  . Surgery l4-l5  1998    ruptured x 3  . Stent     LAD DUS 2004  . Ulnar neuropathy   . Umbilical hernia repair   . Lumbar disc surgery  two  holes in spinalcovering  . Esophagogastroduodenoscopy 11/15/2011    Procedure: ESOPHAGOGASTRODUODENOSCOPY (EGD);  Surgeon: Charna Elizabeth, MD;  Location: [facility];  Service: Endoscopy;  Laterality: N/A;  . Colonoscopy 11/15/2011    Procedure: COLONOSCOPY;  Surgeon: Charna Elizabeth, MD;  Location: [facility];  Service: Endoscopy;  Laterality: N/A;    History   Social History  . Marital Status: Married    Spouse Name: N/A    Number of Children: N/A  . Years of Education: N/A   Occupational History  . Not on file.   Social History Main Topics  . Smoking status: Former Smoker -- 2.0 packs/day for 42 years    Types: Cigarettes    Quit date: 12/30/2008  . Smokeless tobacco: Never Used   Comment: started at age 89; 1-2 ppd; quit 2010  . Alcohol Use: Yes     socially  . Drug Use:  No  . Sexually Active: Yes   Other Topics Concern  . 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 Remaining systems are negative.  Physical Exam: Well-developed obese in no acute distress.  Skin is warm and dry.  HEENT is normal.  Neck is supple. No thyromegaly.  Chest is clear to auscultation with normal expansion.  Cardiovascular exam is regular rate and rhythm.  Abdominal exam nontender or distended. No masses palpated. Extremities show no edema. neuro grossly intact  ECG normal sinus rhythm at a rate of 64. Axis normal. No ST changes. First degree AV block.

## 2012-03-11 NOTE — Patient Instructions (Signed)
Your physician wants you to follow-up in: 6 MONTHS You will receive a reminder letter in the mail two months in advance. If you don't receive a letter, please call our office to schedule the follow-up appointment.   Your physician has requested that you have a lexiscan myoview. For further information please visit [email or web address] Please follow instruction sheet, as given. CALL TO SCHEDULE

## 2012-03-11 NOTE — Assessment & Plan Note (Signed)
Continue aspirin and statin. Arrange followup Myoview for risk stratification.

## 2012-03-18 ENCOUNTER — Telehealth: Payer: Self-pay | Admitting: Cardiology

## 2012-03-18 DIAGNOSIS — I4891 Unspecified atrial fibrillation: Secondary | ICD-10-CM

## 2012-03-18 NOTE — Telephone Encounter (Signed)
Spoke with pt, NUCLEAR STRESS TEST SCHEDULED.

## 2012-03-18 NOTE — Telephone Encounter (Signed)
FU Call: Pt returning call to nurse regarding scheduling of pt test/procedure. Please return pt call to discus further.

## 2012-03-19 ENCOUNTER — Emergency Department (HOSPITAL_COMMUNITY): Payer: 59

## 2012-03-19 ENCOUNTER — Encounter (HOSPITAL_COMMUNITY): Payer: Self-pay | Admitting: Emergency Medicine

## 2012-03-19 ENCOUNTER — Emergency Department (HOSPITAL_COMMUNITY)
Admission: EM | Admit: 2012-03-19 | Discharge: 2012-03-19 | Disposition: A | Discharge status: Home / Self Care | Payer: 59 | Attending: Emergency Medicine | Admitting: Emergency Medicine

## 2012-03-19 ENCOUNTER — Other Ambulatory Visit: Payer: Self-pay

## 2012-03-19 ENCOUNTER — Telehealth: Payer: Self-pay | Admitting: Cardiology

## 2012-03-19 DIAGNOSIS — Z79899 Other long term (current) drug therapy: Secondary | ICD-10-CM | POA: Insufficient documentation

## 2012-03-19 DIAGNOSIS — R42 Dizziness and giddiness: Secondary | ICD-10-CM | POA: Insufficient documentation

## 2012-03-19 DIAGNOSIS — I480 Paroxysmal atrial fibrillation: Secondary | ICD-10-CM

## 2012-03-19 DIAGNOSIS — I498 Other specified cardiac arrhythmias: Secondary | ICD-10-CM | POA: Insufficient documentation

## 2012-03-19 DIAGNOSIS — I4891 Unspecified atrial fibrillation: Secondary | ICD-10-CM

## 2012-03-19 DIAGNOSIS — G4733 Obstructive sleep apnea (adult) (pediatric): Secondary | ICD-10-CM | POA: Insufficient documentation

## 2012-03-19 DIAGNOSIS — R079 Chest pain, unspecified: Secondary | ICD-10-CM | POA: Insufficient documentation

## 2012-03-19 DIAGNOSIS — Z7982 Long term (current) use of aspirin: Secondary | ICD-10-CM | POA: Insufficient documentation

## 2012-03-19 DIAGNOSIS — E785 Hyperlipidemia, unspecified: Secondary | ICD-10-CM | POA: Insufficient documentation

## 2012-03-19 DIAGNOSIS — K219 Gastro-esophageal reflux disease without esophagitis: Secondary | ICD-10-CM | POA: Insufficient documentation

## 2012-03-19 DIAGNOSIS — R0602 Shortness of breath: Secondary | ICD-10-CM | POA: Insufficient documentation

## 2012-03-19 DIAGNOSIS — I2510 Atherosclerotic heart disease of native coronary artery without angina pectoris: Secondary | ICD-10-CM | POA: Insufficient documentation

## 2012-03-19 DIAGNOSIS — R002 Palpitations: Secondary | ICD-10-CM | POA: Insufficient documentation

## 2012-03-19 DIAGNOSIS — I252 Old myocardial infarction: Secondary | ICD-10-CM | POA: Insufficient documentation

## 2012-03-19 LAB — COMPREHENSIVE METABOLIC PANEL
ALT: 59 U/L — ABNORMAL HIGH (ref 0–53)
AST: 29 U/L (ref 0–37)
Albumin: 4.2 g/dL (ref 3.5–5.2)
Alkaline Phosphatase: 80 U/L (ref 39–117)
BUN: 21 mg/dL (ref 6–23)
CO2: 22 mEq/L (ref 19–32)
Calcium: 9.9 mg/dL (ref 8.4–10.5)
Chloride: 104 mEq/L (ref 96–112)
Creatinine, Ser: 1.17 mg/dL (ref 0.50–1.35)
GFR calc Af Amer: 75 mL/min — ABNORMAL LOW (ref >90)
GFR calc non Af Amer: 65 mL/min — ABNORMAL LOW (ref >90)
Glucose, Bld: 154 mg/dL — ABNORMAL HIGH (ref 70–99)
Potassium: 4 mEq/L (ref 3.5–5.1)
Sodium: 138 mEq/L (ref 135–145)
Total Bilirubin: 0.9 mg/dL (ref 0.3–1.2)
Total Protein: 7.3 g/dL (ref 6.0–8.3)

## 2012-03-19 LAB — CBC
HCT: 49.7 % (ref 39.0–52.0)
Hemoglobin: 17.9 g/dL — ABNORMAL HIGH (ref 13.0–17.0)
MCH: 31.7 pg (ref 26.0–34.0)
MCHC: 36 g/dL (ref 30.0–36.0)
MCV: 88 fL (ref 78.0–100.0)
Platelets: 150 10*3/uL (ref 150–400)
RBC: 5.65 MIL/uL (ref 4.22–5.81)
RDW: 12.8 % (ref 11.5–15.5)
WBC: 10.2 10*3/uL (ref 4.0–10.5)

## 2012-03-19 LAB — TROPONIN I: Troponin I: <0.3 ng/mL (ref <0.30)

## 2012-03-19 LAB — DIFFERENTIAL
Basophils Absolute: 0 10*3/uL (ref 0.0–0.1)
Basophils Relative: 0 % (ref 0–1)
Eosinophils Absolute: 0.7 10*3/uL (ref 0.0–0.7)
Eosinophils Relative: 7 % — ABNORMAL HIGH (ref 0–5)
Lymphocytes Relative: 26 % (ref 12–46)
Lymphs Abs: 2.6 10*3/uL (ref 0.7–4.0)
Monocytes Absolute: 0.8 10*3/uL (ref 0.1–1.0)
Monocytes Relative: 8 % (ref 3–12)
Neutro Abs: 6 10*3/uL (ref 1.7–7.7)
Neutrophils Relative %: 59 % (ref 43–77)

## 2012-03-19 LAB — MAGNESIUM: Magnesium: 1.8 mg/dL (ref 1.5–2.5)

## 2012-03-19 MED ORDER — PROPOFOL 10 MG/ML IV BOLUS
0.5000 mg/kg | Freq: Once | INTRAVENOUS | Status: AC
  Administered 2012-03-19: 80 mg via INTRAVENOUS
  Filled 2012-03-19 (×2): qty 20
  Filled 2012-03-19: qty 7.96

## 2012-03-19 MED ORDER — SODIUM CHLORIDE 0.9 % IV BOLUS (SEPSIS)
500.0000 mL | Freq: Once | INTRAVENOUS | Status: AC
  Administered 2012-03-19: 500 mL via INTRAVENOUS

## 2012-03-19 MED ORDER — PROPOFOL 10 MG/ML IV BOLUS: 0.5000 mg/kg | Freq: Once | INTRAVENOUS | Status: DC

## 2012-03-19 NOTE — ED Notes (Signed)
Pt states he felt palpitation in chest at 1400h hrs while sitting in chair.  Denies chest pain , SOB at rest but states he is sob on exertion.  Denies fever, chill, N/V  MOnitor AF 125.

## 2012-03-19 NOTE — ED Notes (Signed)
Cardioverted with by Dr Mayford Knife with conversion to SR 71.  Diprivan toptal 120 mg given by Dr Anitra Lauth

## 2012-03-19 NOTE — ED Notes (Signed)
Pt conversing at bedside with friend.  Remains in SR rate 72.  O2 continues at 2 LNC

## 2012-03-19 NOTE — Telephone Encounter (Signed)
Spoke with pt, he has been in atrial fib for one hour. He c/o dizziness and palpitations. He is unable to palpate his pulse but states it is over 100. He has tried to take his bp and thinks it is at 200/120. He states this is the worst he has felt while out of rhythm. He feels he needs to be cardioverted today. Pt advised to go to [facility] for evaluation. He will have someone drive him. trish the cardmaster is aware.

## 2012-03-19 NOTE — ED Notes (Signed)
Patient denies pain and is resting comfortably.  

## 2012-03-19 NOTE — ED Notes (Signed)
Family updated as to patient's status.

## 2012-03-19 NOTE — Telephone Encounter (Signed)
New Msg: Pt calling wanting to speak with nurse/MD regarding pt just now going into a-fib and having palpitations. Please return pt call to discuss further.

## 2012-03-19 NOTE — ED Notes (Signed)
Pt placed on defib pads and portable monitor. Awaiting cardiologist for cardioversioin.

## 2012-03-19 NOTE — Consult Note (Addendum)
Primary Cardiologist: [name]  Reason for Consultation: Atrial Fibrillation with RVR  HPI: 61 YOWM with PMH of PAF on tikosyn with CHADS2 score of 0 who presents with symptomatic atrial fibrillation with RVR.  The patients has been cardioverted 6 times in the past and the most recent time was July 2012.  He states that they have to use the "max" energy for him.  He reports that he went into AF at 2:15pm this afternoon (8 hours ago).  He reported developing tachypalpitations and a sense of uncomfortableness with mild shortness of breath.  In the ED, he was noted to be in AF with RVR around 130 BPM.    Past Medical History  Diagnosis Date  . HYPERLIPIDEMIA 03/22/2009  . Obesity, unspecified 04/24/2009  . THROMBOCYTOPENIA 08/16/2010  . ERECTILE DYSFUNCTION 03/22/2009  . SLEEP APNEA, OBSTRUCTIVE 03/22/2009    compliant with CPAP  . 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 Coronary atherosclerosis of native coronary artery 11/2002    a. s/p stent to LAD 12/03; OM2 occluded at cath 12/03; d. myoview 5/10: no ischemia;  e. echo 7/11: EF 55%, BAE, mild RVE, PASP 41-45  . Atrial fibrillation 03/22/2009    a. s/p multiple DCCV; b. no coumadin due to low TE risk profile; c. Tikosyn Rx  . GERD 03/22/2009  . LATERAL EPICONDYLITIS, LEFT 10/24/2009  . HYPERGLYCEMIA 04/25/2010  . LIVER FUNCTION TESTS, ABNORMAL 04/25/2010  . TOBACCO USE, QUIT 10/24/2009  . Umbilical hernia   . Myocardial infarction mi2003    History   Social History  . Marital Status: Married    Spouse Name: N/A    Number of Children: N/A  . Years of Education: N/A   Occupational History  . Not on file.   Social History Main Topics  . Smoking status: Former Smoker -- 2.0 packs/day for 42 years    Types: Cigarettes    Quit date: 12/30/2008  . Smokeless tobacco: Never Used   Comment: started at age 4; 1-2 ppd; quit 2010  . Alcohol Use: Yes     socially  . Drug Use: No  . Sexually Active: Yes   Other Topics Concern  . Not on file     Social History Narrative   Retired Engineer, petroleum exercise-yesHas childrenWife is overweight and has fibromyalgia and depression on disability doesn't go out much.m in law sick in ICU    Family History  Problem Relation Age of Onset  . Thyroid disease Mother   . Ovarian cancer Mother   . Breast cancer Mother   . Lung cancer Father   . Cancer Father     Medications Prior to Admission  Medication Dose Route Frequency Provider Last Rate Last Dose  . propofol (DIPRIVAN) 10 mg/mL bolus 79.6 mg  0.5 mg/kg Intravenous Once Gwyneth Sprout, MD      . DISCONTD: propofol (DIPRIVAN) 10 mg/mL bolus 79.6 mg  0.5 mg/kg Intravenous Once Gwyneth Sprout, MD       Medications Prior to Admission  Medication Sig Dispense Refill  . aspirin 325 MG tablet Take 325 mg by mouth daily.      [name] atorvastatin (LIPITOR) 80 MG tablet Take 1 tablet (80 mg total) by mouth daily.  90 tablet  3  . dofetilide (TIKOSYN) 500 MCG capsule Take 1 capsule (500 mcg total) by mouth 2 (two) times daily.  60 capsule  6  . famotidine (PEPCID) 10 MG tablet Take 10 mg by mouth as needed.       . fish oil-omega-3  fatty acids 1000 MG capsule Take 2 g by mouth daily.        . magnesium gluconate (MAGONATE) 500 MG tablet Pt takes OTC MAGNESIUM 400 mg daily      . MULTIPLE VITAMIN PO Take 1 tablet by mouth daily.       . nitroGLYCERIN (NITROSTAT) 0.4 MG SL tablet Place 1 tablet (0.4 mg total) under the tongue every 5 (five) minutes as needed for chest pain.  25 tablet  3  . pantoprazole (PROTONIX) 40 MG tablet Take 1 tablet (40 mg total) by mouth daily.  90 tablet  3  . potassium chloride SA (KLOR-CON M20) 20 MEQ tablet Take 1 tablet (20 mEq total) by mouth daily.  30 tablet  5  . zolpidem (AMBIEN) 10 MG tablet Take 1 tablet (10 mg total) by mouth at bedtime as needed.  30 tablet  0    Allergies  Allergen Reactions  . Penicillins   . Procaine Hcl     REACTION: Reaction not known  . Pseudoephedrine     REACTION: Reaction  not known  . Sulfonamide Derivatives     Review of Systems: All systems reviewed and are negative except as mentioned above in the history of present illness.    PHYSICAL EXAM: Filed Vitals:   03/19/12 2128  BP: 104/70  Pulse: 106  Temp:   Resp: 14   GENERAL: Mild distress.   HEENT: Normocephalic, atraumatic.  Oropharynx is pink and moist without lesions.  NECK: Supple, no LAD, no JVD, no masses. CV: Irreg Irreg, tachy, no murmurs, rubs, or gallops.   LUNGS: Clear to auscultation bilaterally.   ABDOMEN: +BS, soft, nontender, nondistended.  EXTREMITIES: No clubbing, cyanosis, or edema.   NEURO: AO x 3, no focal deficits. PYSCH: Normal affect. SKIN: No rashes.    ECG: AF with RVR  Results for orders placed during the hospital encounter of 03/19/12 (from the past 24 hour(s))  CBC     Status: Abnormal   Collection Time   03/19/12  4:51 PM      Component Value Range   WBC 10.2  4.0 - 10.5 (K/uL)   RBC 5.65  4.22 - 5.81 (MIL/uL)   Hemoglobin 17.9 (*) 13.0 - 17.0 (g/dL)   HCT 24.4  01.0 - 27.2 (%)   MCV 88.0  78.0 - 100.0 (fL)   MCH 31.7  26.0 - 34.0 (pg)   MCHC 36.0  30.0 - 36.0 (g/dL)   RDW 53.6  64.4 - 03.4 (%)   Platelets 150  150 - 400 (K/uL)  DIFFERENTIAL     Status: Abnormal   Collection Time   03/19/12  4:51 PM      Component Value Range   Neutrophils Relative 59  43 - 77 (%)   Neutro Abs 6.0  1.7 - 7.7 (K/uL)   Lymphocytes Relative 26  12 - 46 (%)   Lymphs Abs 2.6  0.7 - 4.0 (K/uL)   Monocytes Relative 8  3 - 12 (%)   Monocytes Absolute 0.8  0.1 - 1.0 (K/uL)   Eosinophils Relative 7 (*) 0 - 5 (%)   Eosinophils Absolute 0.7  0.0 - 0.7 (K/uL)   Basophils Relative 0  0 - 1 (%)   Basophils Absolute 0.0  0.0 - 0.1 (K/uL)  COMPREHENSIVE METABOLIC PANEL     Status: Abnormal   Collection Time   03/19/12  4:51 PM      Component Value Range   Sodium 138  135 -  145 (mEq/L)   Potassium 4.0  3.5 - 5.1 (mEq/L)   Chloride 104  96 - 112 (mEq/L)   CO2 22  19 - 32 (mEq/L)    Glucose, Bld 154 (*) 70 - 99 (mg/dL)   BUN 21  6 - 23 (mg/dL)   Creatinine, Ser 6.21  0.50 - 1.35 (mg/dL)   Calcium 9.9  8.4 - 30.8 (mg/dL)   Total Protein 7.3  6.0 - 8.3 (g/dL)   Albumin 4.2  3.5 - 5.2 (g/dL)   AST 29  0 - 37 (U/L)   ALT 59 (*) 0 - 53 (U/L)   Alkaline Phosphatase 80  39 - 117 (U/L)   Total Bilirubin 0.9  0.3 - 1.2 (mg/dL)   GFR calc non Af Amer 65 (*) >90 (mL/min)   GFR calc Af Amer 75 (*) >90 (mL/min)  MAGNESIUM     Status: Normal   Collection Time   03/19/12  4:51 PM      Component Value Range   Magnesium 1.8  1.5 - 2.5 (mg/dL)  TROPONIN I     Status: Normal   Collection Time   03/19/12  4:56 PM      Component Value Range   Troponin I <0.30  <0.30 (ng/mL)   Dg Chest 2 View  03/19/2012  *RADIOLOGY REPORT*  Clinical Data: Dizziness.  History of atrial fibrillation.  CHEST - 2 VIEW  Comparison: 05/17/2001 and 11/24/2010.  Findings: There is stable mild cardiomegaly and chronic vascular congestion.  No overt pulmonary edema or confluent airspace opacity is seen.  There is no pleural effusion or pneumothorax.  The osseous structures appear unchanged.  IMPRESSION: Stable examination with cardiomegaly and chronic vascular congestion.  Acute findings identified.  Original Report Authenticated By: Gerrianne Scale, M.D.   PROCEDURE NOTE: As the patient's AF was present for less than 48 hours and because he was symptomatic, the decision was made to cardiovert the patient.  After informed consent, sedation was provided by the ER (Dr. Anitra Lauth), patient was successfully DCCV (200J) from atrial fibrillation to sinus rhythm at 75 BPM.  Patient tolerated procedure well.   ASSESSMENT and PLAN: 30 YOWM with PMH of CAD s/p PCI and PAF on tikosyn who presented with AF RVR and is now in sinus rhythm after cardioversion.   1: PAF - currently in NSR after DCCV.  Recommend continuing tikosyn at home dose of 500 mcg BID.  His Qtc looks good and his labs are acceptable.  Recommend taking  an extra dose of magnesium tonight.  Follow-up with Dr. [name] in next few weeks (scheduled for stress test).   Gwendalyn Ege, MD Level 5 Consult Procedure Note - Cardioversion

## 2012-03-19 NOTE — ED Provider Notes (Signed)
History     CSN: [id number]  Arrival date & time 03/19/12  1600   First MD Initiated Contact with Patient 03/19/12 2015      Chief Complaint  Patient presents with  . Chest Pain    (Consider location/radiation/quality/duration/timing/severity/associated sxs/prior treatment) Patient is a 61 y.o. male presenting with chest pain. The history is provided by the patient.  Chest Pain The chest pain began 6 - 12 hours ago (There is no true chest pain. He states his chest just feels funny and he has palpitations like he usually does when his heart was out of rhythm). Duration of episode(s) is 6 hours. Chest pain occurs constantly. The chest pain is unchanged. At its most intense, the pain is at 0/10. The pain is currently at 0/10. The severity of the pain is moderate. The pain does not radiate. Exacerbated by: Nothing. Primary symptoms include shortness of breath, palpitations and dizziness. Pertinent negatives for primary symptoms include no fever, no cough, no wheezing, no abdominal pain, no nausea and no vomiting.  The palpitations also occurred with dizziness and shortness of breath.  Dizziness does not occur with nausea, vomiting or diaphoresis.   Pertinent negatives for associated symptoms include no diaphoresis and no lower extremity edema. He tried nothing for the symptoms. Past medical history comments: Patient has known history of intermittent A. fib that is required cardioversion back to normal sinus rhythm     Past Medical History  Diagnosis Date  . HYPERLIPIDEMIA 03/22/2009  . Obesity, unspecified 04/24/2009  . THROMBOCYTOPENIA 08/16/2010  . ERECTILE DYSFUNCTION 03/22/2009  . SLEEP APNEA, OBSTRUCTIVE 03/22/2009    compliant with CPAP  . 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 Coronary atherosclerosis of native coronary artery 11/2002    a. s/p stent to LAD 12/03; OM2 occluded at cath 12/03; d. myoview 5/10: no ischemia;  e. echo 7/11: EF 55%, BAE, mild RVE, PASP 41-45  . Atrial  fibrillation 03/22/2009    a. s/p multiple DCCV; b. no coumadin due to low TE risk profile; c. Tikosyn Rx  . GERD 03/22/2009  . LATERAL EPICONDYLITIS, LEFT 10/24/2009  . HYPERGLYCEMIA 04/25/2010  . LIVER FUNCTION TESTS, ABNORMAL 04/25/2010  . TOBACCO USE, QUIT 10/24/2009  . Umbilical hernia   . Myocardial infarction mi2003    Past Surgical History  Procedure Date  . Cyst on epiglottis 08/2002  . Surgery l4-l5  1998    ruptured x 3  . Stent     LAD DUS 2004  . Ulnar neuropathy   . Umbilical hernia repair   . Lumbar disc surgery     two  holes in spinalcovering  . Esophagogastroduodenoscopy 11/15/2011    Procedure: ESOPHAGOGASTRODUODENOSCOPY (EGD);  Surgeon: Charna Elizabeth, MD;  Location: [facility];  Service: Endoscopy;  Laterality: N/A;  . Colonoscopy 11/15/2011    Procedure: COLONOSCOPY;  Surgeon: Charna Elizabeth, MD;  Location: [facility];  Service: Endoscopy;  Laterality: N/A;    Family History  Problem Relation Age of Onset  . Thyroid disease Mother   . Ovarian cancer Mother   . Breast cancer Mother   . Lung cancer Father   . Cancer Father     History  Substance Use Topics  . Smoking status: Former Smoker -- 2.0 packs/day for 42 years    Types: Cigarettes    Quit date: 12/30/2008  . Smokeless tobacco: Never Used   Comment: started at age 13; 1-2 ppd; quit 2010  . Alcohol Use: Yes     socially  Review of Systems  Constitutional: Negative for fever and diaphoresis.  Respiratory: Positive for shortness of breath. Negative for cough and wheezing.   Cardiovascular: Positive for chest pain and palpitations.  Gastrointestinal: Negative for nausea, vomiting and abdominal pain.  Neurological: Positive for dizziness.  All other systems reviewed and are negative.    Allergies  Penicillins; Procaine hcl; Pseudoephedrine; and Sulfonamide derivatives  Home Medications   Current Outpatient Rx  Name Route Sig Dispense Refill  . ASPIRIN 325 MG PO TABS Oral Take 325  mg by mouth daily.    . ATORVASTATIN CALCIUM 80 MG PO TABS Oral Take 1 tablet (80 mg total) by mouth daily. 90 tablet 3  . DOFETILIDE 500 MCG PO CAPS Oral Take 1 capsule (500 mcg total) by mouth 2 (two) times daily. 60 capsule 6  . FAMOTIDINE 10 MG PO TABS Oral Take 10 mg by mouth as needed.     . OMEGA-3 FATTY ACIDS 1000 MG PO CAPS Oral Take 2 g by mouth daily.      [name] MAGNESIUM GLUCONATE 500 MG PO TABS  Pt takes OTC MAGNESIUM 400 mg daily    . MULTIPLE VITAMIN PO Oral Take 1 tablet by mouth daily.     [name] NITROGLYCERIN 0.4 MG SL SUBL Sublingual Place 1 tablet (0.4 mg total) under the tongue every 5 (five) minutes as needed for chest pain. 25 tablet 3  . PANTOPRAZOLE SODIUM 40 MG PO TBEC Oral Take 1 tablet (40 mg total) by mouth daily. 90 tablet 3  . POTASSIUM CHLORIDE CRYS ER 20 MEQ PO TBCR Oral Take 1 tablet (20 mEq total) by mouth daily. 30 tablet 5  . ZOLPIDEM TARTRATE 10 MG PO TABS Oral Take 1 tablet (10 mg total) by mouth at bedtime as needed. 30 tablet 0    BP 116/63  Pulse 60  Temp(Src) 98.1 F (36.7 C) (Oral)  Resp 15  SpO2 93%  Physical Exam  Nursing note and vitals reviewed. Constitutional: He is oriented to person, place, and time. He appears well-developed and well-nourished. No distress.  HENT:  Head: Normocephalic and atraumatic.  Mouth/Throat: Oropharynx is clear and moist.  Eyes: Conjunctivae and EOM are normal. Pupils are equal, round, and reactive to light.  Neck: Normal range of motion. Neck supple.  Cardiovascular: Normal rate and intact distal pulses.  An irregularly irregular rhythm present.  No murmur heard. Pulmonary/Chest: Effort normal and breath sounds normal. No respiratory distress. He has no wheezes. He has no rales.  Abdominal: Soft. He exhibits no distension. There is no tenderness. There is no rebound and no guarding.  Musculoskeletal: Normal range of motion. He exhibits no edema and no tenderness.  Neurological: He is alert and oriented to person,  place, and time.  Skin: Skin is warm and dry. No rash noted. No erythema.  Psychiatric: He has a normal mood and affect. His behavior is normal.    ED Course  Procedures (including critical care time)  Labs Reviewed  CBC - Abnormal; Notable for the following:    Hemoglobin 17.9 (*)    All other components within normal limits  DIFFERENTIAL - Abnormal; Notable for the following:    Eosinophils Relative 7 (*)    All other components within normal limits  COMPREHENSIVE METABOLIC PANEL - Abnormal; Notable for the following:    Glucose, Bld 154 (*)    ALT 59 (*)    GFR calc non Af Amer 65 (*)    GFR calc Af Amer 75 (*)  All other components within normal limits  TROPONIN I  MAGNESIUM   Dg Chest 2 View  03/19/2012  *RADIOLOGY REPORT*  Clinical Data: Dizziness.  History of atrial fibrillation.  CHEST - 2 VIEW  Comparison: 05/17/2001 and 11/24/2010.  Findings: There is stable mild cardiomegaly and chronic vascular congestion.  No overt pulmonary edema or confluent airspace opacity is seen.  There is no pleural effusion or pneumothorax.  The osseous structures appear unchanged.  IMPRESSION: Stable examination with cardiomegaly and chronic vascular congestion.  Acute findings identified.  Original Report Authenticated By: Gerrianne Scale, M.D.    Date: 03/19/2012  Rate: 143  Rhythm: atrial fibrillation with RVR  QRS Axis: right  Intervals: normal  ST/T Wave abnormalities: nonspecific ST changes  Conduction Disutrbances:none  Narrative Interpretation:   Old EKG Reviewed: changes noted patient has gone from sinus bradycardia to atrial fibrillation   Date: 03/19/2012  Rate: 75  Rhythm: normal sinus rhythm  QRS Axis: normal  Intervals: normal  ST/T Wave abnormalities: normal  Conduction Disutrbances:none  Narrative Interpretation:   Old EKG Reviewed: changes noted  Conversion back to sinus rhythm  Procedural sedation Performed by: Gwyneth Sprout Consent: Verbal consent  obtained. Risks and benefits: risks, benefits and alternatives were discussed Required items: required blood products, implants, devices, and special equipment available Patient identity confirmed: arm band and provided demographic data Time out: Immediately prior to procedure a "time out" was called to verify the correct patient, procedure, equipment, support staff and site/side marked as required.  Sedation type: moderate (conscious) sedation NPO time confirmed and > 6hours  Sedatives: PROPOFOL  Physician Time at Bedside: 15  Vitals: Vital signs were monitored during sedation. Cardiac Monitor, pulse oximeter Patient tolerance: Patient tolerated the procedure well with no immediate complications. Comments: Pt with uneventful recovered. Returned to pre-procedural sedation baseline  CRITICAL CARE Performed by: Gwyneth Sprout   Total critical care time: 30  Critical care time was exclusive of separately billable procedures and treating other patients.  Critical care was necessary to treat or prevent imminent or life-threatening deterioration.  Critical care was time spent personally by me on the following activities: development of treatment plan with patient and/or surrogate as well as nursing, discussions with consultants, evaluation of patient's response to treatment, examination of patient, obtaining history from patient or surrogate, ordering and performing treatments and interventions, ordering and review of laboratory studies, ordering and review of radiographic studies, pulse oximetry and re-evaluation of patient's condition.       No diagnosis found.    MDM   Patient with a history of intermittent A. fib that required cardioversion a total of 6 times in the last 6 years. Who went into A. fib today. Initially on arrival his rate was 143 and he was in A. fib RVR. However since he has remained in A. fib but his rate has improved to less than 100. Patient denies any chest  pain but just states that he feel short of breath palpitations and not well.  All lab results are within normal limits and chest x-ray is unchanged. EKG showed A. fib RVR without any other significant changes. Will speak with cardiology patient is n.p.o. for 6 hours and will see if they want to try to cardiovert him in the ER as patient has had this done multiple times and then goes home.   9:51 PM Patient was sedated and cardioverted with 200 J of electricity. After cardioversion patient was in sinus rhythm. He tolerated sedation without any complications.  10:18  PM Patient still feeling well and in sinus rhythm.  Will d/c home and pt will f/u with cardiology.  Gwyneth Sprout, MD 03/19/12 2220

## 2012-03-19 NOTE — ED Notes (Signed)
Felt palpitations as he sat to watch game call his cards dr and was told to come ro er

## 2012-03-19 NOTE — Discharge Instructions (Signed)
Atrial Fibrillation Your caregiver has diagnosed you with atrial fibrillation (AFib). The heart normally beats very regularly; AFib is a type of irregular heartbeat. The heart rate may be faster or slower than normal. This can prevent your heart from pumping as well as it should. AFib can be constant (chronic) or intermittent (paroxysmal). CAUSES  Atrial fibrillation may be caused by:  Heart disease, including heart attack, coronary artery disease, heart failure, diseases of the heart valves, and others.   Blood clot in the lungs (pulmonary embolism).   Pneumonia or other infections.   Chronic lung disease.   Thyroid disease.   Toxins. These include alcohol, some medications (such as decongestant medications or diet pills), and caffeine.  In some people, no cause for AFib can be found. This is referred to as Lone Atrial Fibrillation. SYMPTOMS   Palpitations or a fluttering in your chest.   A vague sense of chest discomfort.   Shortness of breath.   Sudden onset of lightheadedness or weakness.  Sometimes, the first sign of AFib can be a complication of the condition. This could be a stroke or heart failure. DIAGNOSIS  Your description of your condition may make your caregiver suspicious of atrial fibrillation. Your caregiver will examine your pulse to determine if fibrillation is present. An EKG (electrocardiogram) will confirm the diagnosis. Further testing may help determine what caused you to have atrial fibrillation. This may include chest x-ray, echocardiogram, blood tests, or CT scans. PREVENTION  If you have previously had atrial fibrillation, your caregiver may advise you to avoid substances known to cause the condition (such as stimulant medications, and possibly caffeine or alcohol). You may be advised to use medications to prevent recurrence. Proper treatment of any underlying condition is important to help prevent recurrence. PROGNOSIS  Atrial fibrillation does tend to  become a chronic condition over time. It can cause significant complications (see below). Atrial fibrillation is not usually immediately life-threatening, but it can shorten your life expectancy. This seems to be worse in women. If you have lone atrial fibrillation and are under 60 years old, the risk of complications is very low, and life expectancy is not shortened. RISKS AND COMPLICATIONS  Complications of atrial fibrillation can include stroke, chest pain, and heart failure. Your caregiver will recommend treatments for the atrial fibrillation, as well as for any underlying conditions, to help minimize risk of complications. TREATMENT  Treatment for AFib is divided into several categories:  Treatment of any underlying condition.   Converting you out of AFib into a regular (sinus) rhythm.   Controlling rapid heart rate.   Prevention of blood clots and stroke.  Medications and procedures are available to convert your atrial fibrillation to sinus rhythm. However, recent studies have shown that this may not offer you any advantage, and cardiac experts are continuing research and debate on this topic. More important is controlling your rapid heartbeat. The rapid heartbeat causes more symptoms, and places strain on your heart. Your caregiver will advise you on the use of medications that can control your heart rate. Atrial fibrillation is a strong stroke risk. You can lessen this risk by taking blood thinning medications such as Coumadin (warfarin), or sometimes aspirin. These medications need close monitoring by your caregiver. Over-medication can cause bleeding. Too little medication may not protect against stroke. HOME CARE INSTRUCTIONS   If your caregiver prescribed medicine to make your heartbeat more normally, take as directed.   If blood thinners were prescribed by your caregiver, take EXACTLY as directed.     Perform blood tests EXACTLY as directed.   Quit smoking. Smoking increases your  cardiac and lung (pulmonary) risks.   DO NOT drink alcohol.   DO NOT drink caffeinated drinks (e.g. coffee, soda, chocolate, and leaf teas). You may drink decaffeinated coffee, soda or tea.   If you are overweight, you should choose a reduced calorie diet to lose weight. Please see a registered dietitian if you need more information about healthy weight loss. DO NOT USE DIET PILLS as they may aggravate heart problems.   If you have other heart problems that are causing AFib, you may need to eat a low salt, fat, and cholesterol diet. Your caregiver will tell you if this is necessary.   Exercise every day to improve your physical fitness. Stay active unless advised otherwise.   If your caregiver has given you a follow-up appointment, it is very important to keep that appointment. Not keeping the appointment could result in heart failure or stroke. If there is any problem keeping the appointment, you must call back to this facility for assistance.  SEEK MEDICAL CARE IF:  You notice a change in the rate, rhythm or strength of your heartbeat.   You develop an infection or any other change in your overall health status.  SEEK IMMEDIATE MEDICAL CARE IF:   You develop chest pain, abdominal pain, sweating, weakness or feel sick to your stomach (nausea).   You develop shortness of breath.   You develop swollen feet and ankles.   You develop dizziness, numbness, or weakness of your face or limbs, or any change in vision or speech.  MAKE SURE YOU:   Understand these instructions.   Will watch your condition.   Will get help right away if you are not doing well or get worse.  Document Released: 12/16/2005 Document Revised: 12/05/2011 Document Reviewed: 07/20/2008 Exit[employer] Patient Information 2012 [employer].Electrical Cardioversion Cardioversion is the delivery of a jolt of electricity to change the rhythm of the heart. Sticky patches or metal paddles are placed on the chest to deliver  the electricity from a special device. This is done to restore a normal rhythm. A rhythm that is too fast or not regular keeps the heart from pumping well. Compared to medicines used to change an abnormal rhythm, cardioversion is faster and works better. It is also unpleasant and may dislodge blood clots from the heart. WHEN WOULD THIS BE DONE?  In an emergency:   There is low or no blood pressure as a result of the heart rhythm.   Normal rhythm must be restored as fast as possible to protect the brain and heart from further damage.   It may save a life.   For less serious heart rhythms, such as atrial fibrillation or flutter, in which:   The heart is beating too fast or is not regular.   The heart is still able to pump enough blood, but not as well as it should.   Medicine to change the rhythm has not worked.   It is safe to wait in order to allow time for preparation.  LET YOUR CAREGIVER KNOW ABOUT:   Every medicine you are taking. It is very important to do this! Know when to take or stop taking any of them.   Any time in the past that you have felt your heart was not beating normally.  RISKS AND COMPLICATIONS   Clots may form in the chambers of the heart if it is beating too fast. These clots may   be dislodged during the procedure and travel to other parts of the body.   There is risk of a stroke during and after the procedure if a clot moves. Blood thinners lower this risk.   You may have a special test of your heart (TEE) to make sure there are no clots in your heart.  BEFORE THE PROCEDURE   You may have some tests to see how well your heart is working.   You may start taking blood thinners so your blood does not clot as easily.   Other drugs may be given to help your heart work better.  PROCEDURE (SCHEDULED)  The procedure is typically done in a hospital by a heart doctor (cardiologist).   You will be told when and where to go.   You may be given some medicine  through an intravenous (IV) access to reduce discomfort and make you sleepy before the procedure.   Your whole body may move when the shock is delivered. Your chest may feel sore.   You may be able to go home after a few hours. Your heart rhythm will be watched to make sure it does not change.  HOME CARE INSTRUCTIONS   Only take medicine as directed by your caregiver. Be sure you understand how and when to take your medicine.   Learn how to feel your pulse and check it often.   Limit your activity for 48 hours.   Avoid caffeine and other stimulants as directed.  SEEK MEDICAL CARE IF:   You feel like your heart is beating too fast or your pulse is not regular.   You have any questions about your medicines.   You have bleeding that will not stop.  SEEK IMMEDIATE MEDICAL CARE IF:   You are dizzy or feel faint.   It is hard to breathe or you feel short of breath.   There is a change in discomfort in your chest.   Your speech is slurred or you have trouble moving your arm or leg on one side.   You get a muscle cramp.   Your fingers or toes turn cold or blue.  MAKE SURE YOU:   Understand these instructions.   Will watch your condition.   Will get help right away if you are not doing well or get worse.  Document Released: 12/06/2002 Document Revised: 12/05/2011 Document Reviewed: 04/06/2008 [employer] Patient Information 2012 [employer]. 

## 2012-03-20 ENCOUNTER — Telehealth: Payer: Self-pay | Admitting: Cardiology

## 2012-03-20 NOTE — Telephone Encounter (Signed)
Continue same meds and schedule fu ov next 4-6 weeks [name] [name]

## 2012-03-20 NOTE — Telephone Encounter (Signed)
Sent to Dr/Nurse

## 2012-03-20 NOTE — Telephone Encounter (Signed)
New msg Pt said he went to er last night and had cardioversion there. He wanted to tell Dr. [name] and to see if he needs to do anything. Please call him back

## 2012-03-23 ENCOUNTER — Telehealth: Payer: Self-pay | Admitting: Cardiology

## 2012-03-23 MED ORDER — DIAZEPAM 5 MG PO TABS: ORAL_TABLET | ORAL | Status: DC

## 2012-03-23 NOTE — Telephone Encounter (Signed)
Pt wants to know if he still needs appt for myoview since he got shocked

## 2012-03-23 NOTE — Telephone Encounter (Signed)
Spoke with pt wife, pt to call back to schedule.

## 2012-03-23 NOTE — Telephone Encounter (Signed)
Spoke with pt, he is aware he still need stress testing for risk stratification. He ask for valium for his nerves prior to the stress test. Per dr [name] okay to give.

## 2012-03-26 ENCOUNTER — Ambulatory Visit (HOSPITAL_COMMUNITY): Payer: 59

## 2012-03-26 ENCOUNTER — Ambulatory Visit (HOSPITAL_COMMUNITY): Payer: 59 | Attending: Cardiovascular Disease | Admitting: Radiology

## 2012-03-26 VITALS — BP 111/72 | Ht 72.0 in | Wt 351.0 lb

## 2012-03-26 DIAGNOSIS — I2510 Atherosclerotic heart disease of native coronary artery without angina pectoris: Secondary | ICD-10-CM

## 2012-03-26 DIAGNOSIS — I252 Old myocardial infarction: Secondary | ICD-10-CM | POA: Insufficient documentation

## 2012-03-26 DIAGNOSIS — R000 Tachycardia, unspecified: Secondary | ICD-10-CM | POA: Insufficient documentation

## 2012-03-26 DIAGNOSIS — R079 Chest pain, unspecified: Secondary | ICD-10-CM | POA: Insufficient documentation

## 2012-03-26 DIAGNOSIS — I4891 Unspecified atrial fibrillation: Secondary | ICD-10-CM

## 2012-03-26 DIAGNOSIS — R42 Dizziness and giddiness: Secondary | ICD-10-CM | POA: Insufficient documentation

## 2012-03-26 DIAGNOSIS — R002 Palpitations: Secondary | ICD-10-CM | POA: Insufficient documentation

## 2012-03-26 DIAGNOSIS — E785 Hyperlipidemia, unspecified: Secondary | ICD-10-CM | POA: Insufficient documentation

## 2012-03-26 DIAGNOSIS — Z87891 Personal history of nicotine dependence: Secondary | ICD-10-CM | POA: Insufficient documentation

## 2012-03-26 DIAGNOSIS — E669 Obesity, unspecified: Secondary | ICD-10-CM | POA: Insufficient documentation

## 2012-03-26 MED ORDER — TECHNETIUM TC 99M TETROFOSMIN IV KIT
33.0000 | PACK | Freq: Once | INTRAVENOUS | Status: AC | PRN
  Administered 2012-03-26: 33 via INTRAVENOUS

## 2012-03-26 MED ORDER — REGADENOSON 0.4 MG/5ML IV SOLN
0.4000 mg | Freq: Once | INTRAVENOUS | Status: AC
  Administered 2012-03-26: 0.4 mg via INTRAVENOUS

## 2012-03-26 NOTE — Progress Notes (Signed)
[facility] SITE 3 NUCLEAR MED [address] [place] [address] [phone]  Cardiology Nuclear Med Study  [name] [name] is a 61 y.o. male     MRN : [id number]     DOB: February 09, 1951  Procedure Date: 03/26/2012  Nuclear Med Background Indication for Stress Test:  Evaluation for Ischemia, Stent Patency, Post Hospital and 03/19/12 ED with CP History: Afib,'03 MI:-Heart Cath-stent LAD, '10 MPS:EF: 48% (-) ischemia, 7/11 ECHO: EF: 55%,7/12 Cardioversion:  Cardiac Risk Factors: History of Smoking, Lipids and Obesity  Symptoms:  Chest Pain, Dizziness, Palpitations and Rapid HR   Nuclear Pre-Procedure Caffeine/Decaff Intake:  None NPO After: 7:30am   Lungs:  clear O2 Sat: 97% on room air. IV 0.9% NS with Angio Cath:  22g  IV Site: R Antecubital  IV Started by:  Milana Na, EMT-P  Chest Size (in):  54 Cup Size: n/a  Height: 6' (1.829 m)  Weight:  351 lb (159.213 kg)  BMI:  Body mass index is 47.60 kg/(m^2). Tech Comments:  Pt took Rx this am    Nuclear Med Study 1 or 2 day study: 2 day  Stress Test Type:  Lexiscan  Reading MD: Charlton Haws, MD  Order Authorizing Provider:  Olga Millers MD  Resting Radionuclide: Technetium 40m Tetrofosmin  Resting Radionuclide Dose: 33.0 mCi  03/30/12  Stress Radionuclide:  Technetium 49m Tetrofosmin  Stress Radionuclide Dose: 33.0 mCi  03/26/12          Stress Protocol Rest HR: 49 Stress HR: 74  Rest BP: 111/72 Stress BP: 125/62  Exercise Time (min): n/a METS: n/a   Predicted Max HR: 158 bpm % Max HR: 46.84 bpm Rate Pressure Product: 9250   Dose of Adenosine (mg):  n/a Dose of Lexiscan: 0.4 mg  Dose of Atropine (mg): n/a Dose of Dobutamine: n/a mcg/kg/min (at max HR)  Stress Test Technologist: Milana Na, EMT-P  Nuclear Technologist:  Domenic Polite, CNMT     Rest Procedure:  Myocardial perfusion imaging was performed at rest 45 minutes following the intravenous administration of Technetium 86m  Tetrofosmin. Rest ECG: Sinus Bradycardia  Stress Procedure:  The patient received IV Lexiscan 0.4 mg over 15-seconds.  Technetium 67m Tetrofosmin injected at 30-seconds.  There were non specific changes, abdominal tightness, and sob with Lexiscan.  Quantitative spect images were obtained after a 45 minute delay. Stress ECG: No significant ST segment change suggestive of ischemia.  QPS Raw Data Images:  Patient motion noted; appropriate software correction applied. Stress Images:  No significant abnormality. Rest Images:  Same as stress Subtraction (SDS):  No evidence of ischemia. Transient Ischemic Dilatation (Normal <1.22): 1.03 Lung/Heart Ratio (Normal <0.45):  0.47  Quantitative Gated Spect Images QGS EDV:  176 ml QGS ESV:  86 ml  Impression Exercise Capacity:  Lexiscan with no exercise. BP Response:  Normal blood pressure response. Clinical Symptoms:  SOB ECG Impression:  No significant ST segment change suggestive of ischemia. Comparison with Prior Nuclear Study: No significant change from previous study  Overall Impression:  Normal stress nuclear study. Images very similar to 2010. The tomographic images are noisey, but there are no abnormalities.  LV Ejection Fraction: 51%.  LV Wall Motion:  Normal Wall Motion  Willa Rough, MD

## 2012-03-30 ENCOUNTER — Ambulatory Visit (HOSPITAL_COMMUNITY): Payer: 59 | Attending: Cardiology

## 2012-03-30 DIAGNOSIS — R0989 Other specified symptoms and signs involving the circulatory and respiratory systems: Secondary | ICD-10-CM

## 2012-03-30 MED ORDER — TECHNETIUM TC 99M TETROFOSMIN IV KIT
30.0000 | PACK | Freq: Once | INTRAVENOUS | Status: AC | PRN
  Administered 2012-03-30: 30 via INTRAVENOUS

## 2012-04-01 ENCOUNTER — Other Ambulatory Visit: Payer: Self-pay | Admitting: Internal Medicine

## 2012-04-11 ENCOUNTER — Emergency Department (HOSPITAL_COMMUNITY)
Admission: EM | Admit: 2012-04-11 | Discharge: 2012-04-11 | Disposition: A | Discharge status: Home / Self Care | Payer: 59 | Source: Home / Self Care

## 2012-04-11 ENCOUNTER — Emergency Department (HOSPITAL_COMMUNITY)
Admission: EM | Admit: 2012-04-11 | Discharge: 2012-04-11 | Disposition: A | Discharge status: Home Health | Payer: 59 | Attending: Emergency Medicine | Admitting: Emergency Medicine

## 2012-04-11 ENCOUNTER — Encounter (HOSPITAL_COMMUNITY): Payer: Self-pay

## 2012-04-11 DIAGNOSIS — L0390 Cellulitis, unspecified: Secondary | ICD-10-CM

## 2012-04-11 DIAGNOSIS — D696 Thrombocytopenia, unspecified: Secondary | ICD-10-CM | POA: Insufficient documentation

## 2012-04-11 DIAGNOSIS — I2510 Atherosclerotic heart disease of native coronary artery without angina pectoris: Secondary | ICD-10-CM | POA: Insufficient documentation

## 2012-04-11 DIAGNOSIS — M79609 Pain in unspecified limb: Secondary | ICD-10-CM | POA: Insufficient documentation

## 2012-04-11 DIAGNOSIS — E785 Hyperlipidemia, unspecified: Secondary | ICD-10-CM | POA: Insufficient documentation

## 2012-04-11 DIAGNOSIS — Z87891 Personal history of nicotine dependence: Secondary | ICD-10-CM | POA: Insufficient documentation

## 2012-04-11 DIAGNOSIS — M7989 Other specified soft tissue disorders: Secondary | ICD-10-CM | POA: Insufficient documentation

## 2012-04-11 DIAGNOSIS — M79669 Pain in unspecified lower leg: Secondary | ICD-10-CM

## 2012-04-11 DIAGNOSIS — I252 Old myocardial infarction: Secondary | ICD-10-CM | POA: Insufficient documentation

## 2012-04-11 DIAGNOSIS — G4733 Obstructive sleep apnea (adult) (pediatric): Secondary | ICD-10-CM | POA: Insufficient documentation

## 2012-04-11 DIAGNOSIS — I4891 Unspecified atrial fibrillation: Secondary | ICD-10-CM | POA: Insufficient documentation

## 2012-04-11 DIAGNOSIS — L02419 Cutaneous abscess of limb, unspecified: Secondary | ICD-10-CM | POA: Insufficient documentation

## 2012-04-11 MED ORDER — OXYCODONE-ACETAMINOPHEN 5-325 MG PO TABS: 1.0000 | ORAL_TABLET | Freq: Four times a day (QID) | ORAL | Status: AC | PRN

## 2012-04-11 MED ORDER — DOXYCYCLINE HYCLATE 100 MG PO CAPS: 100.0000 mg | ORAL_CAPSULE | Freq: Two times a day (BID) | ORAL | Status: AC

## 2012-04-11 NOTE — ED Notes (Signed)
C/O pain in the right lower leg since last PM. Right lateral lower leg very red and warm to touch.

## 2012-04-11 NOTE — ED Notes (Signed)
Attempted to move pt to CDU #3 for doppler - Candace, Vascular Tech, said she was already in pt's room - Dr Rubin Payor aware.

## 2012-04-11 NOTE — Discharge Instructions (Signed)

## 2012-04-11 NOTE — ED Provider Notes (Signed)
History     CSN: [id number]  Arrival date & time 04/11/12  1639   First MD Initiated Contact with Patient 04/11/12 1806      Chief Complaint  Patient presents with  . Leg Pain    (Consider location/radiation/quality/duration/timing/severity/associated sxs/prior treatment) Patient is a 61 y.o. male presenting with leg pain. The history is provided by the patient.  Leg Pain    patient has some pain in redness to his right lower leg. Has a history of cellulitis. He does work is what it was and went to urgent care for some antibiotics. Urgent care Center for evaluation DVT. No trouble breathing. No chest pain. No fevers. No trauma.  Past Medical History  Diagnosis Date  . HYPERLIPIDEMIA 03/22/2009  . Obesity, unspecified 04/24/2009  . THROMBOCYTOPENIA 08/16/2010  . ERECTILE DYSFUNCTION 03/22/2009  . SLEEP APNEA, OBSTRUCTIVE 03/22/2009    compliant with CPAP  . 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 Coronary atherosclerosis of native coronary artery 11/2002    a. s/p stent to LAD 12/03; OM2 occluded at cath 12/03; d. myoview 5/10: no ischemia;  e. echo 7/11: EF 55%, BAE, mild RVE, PASP 41-45  . Atrial fibrillation 03/22/2009    a. s/p multiple DCCV; b. no coumadin due to low TE risk profile; c. Tikosyn Rx  . GERD 03/22/2009  . LATERAL EPICONDYLITIS, LEFT 10/24/2009  . HYPERGLYCEMIA 04/25/2010  . LIVER FUNCTION TESTS, ABNORMAL 04/25/2010  . TOBACCO USE, QUIT 10/24/2009  . Umbilical hernia   . Myocardial infarction mi2003    Past Surgical History  Procedure Date  . Cyst on epiglottis 08/2002  . Surgery l4-l5  1998    ruptured x 3  . Stent     LAD DUS 2004  . Ulnar neuropathy   . Umbilical hernia repair   . Lumbar disc surgery     two  holes in spinalcovering  . Esophagogastroduodenoscopy 11/15/2011    Procedure: ESOPHAGOGASTRODUODENOSCOPY (EGD);  Surgeon: Charna Elizabeth, MD;  Location: [facility];  Service: Endoscopy;  Laterality: N/A;  . Colonoscopy 11/15/2011    Procedure:  COLONOSCOPY;  Surgeon: Charna Elizabeth, MD;  Location: [facility];  Service: Endoscopy;  Laterality: N/A;    Family History  Problem Relation Age of Onset  . Thyroid disease Mother   . Ovarian cancer Mother   . Breast cancer Mother   . Lung cancer Father   . Cancer Father     History  Substance Use Topics  . Smoking status: Former Smoker -- 2.0 packs/day for 42 years    Types: Cigarettes    Quit date: 12/30/2008  . Smokeless tobacco: Never Used   Comment: started at age 44; 1-2 ppd; quit 2010  . Alcohol Use: Yes     socially      Review of Systems  Constitutional: Negative for chills.  Respiratory: Negative for cough and shortness of breath.   Cardiovascular: Positive for leg swelling. Negative for chest pain.  Musculoskeletal: Negative for joint swelling and gait problem.  Skin: Positive for color change and rash. Negative for wound.    Allergies  Penicillins; Procaine hcl; Pseudoephedrine; and Sulfonamide derivatives  Home Medications   Current Outpatient Rx  Name Route Sig Dispense Refill  . ASPIRIN 325 MG PO TABS Oral Take 325 mg by mouth daily.    . ATORVASTATIN CALCIUM 80 MG PO TABS  TAKE 1 TABLET DAILY 90 tablet 0  . B COMPLEX PO TABS Oral Take 1 tablet by mouth daily.    . DOFETILIDE 500  MCG PO CAPS Oral Take 1 capsule (500 mcg total) by mouth 2 (two) times daily. 60 capsule 6  . OMEGA-3 FATTY ACIDS 1000 MG PO CAPS Oral Take 2 g by mouth daily.      [name] MAGNESIUM GLUCONATE 500 MG PO TABS  Pt takes OTC MAGNESIUM 400 mg daily    . MULTIPLE VITAMIN PO Oral Take 1 tablet by mouth daily.     [name] PANTOPRAZOLE SODIUM 40 MG PO TBEC Oral Take 1 tablet (40 mg total) by mouth daily. 90 tablet 3  . POTASSIUM CHLORIDE CRYS ER 20 MEQ PO TBCR Oral Take 1 tablet (20 mEq total) by mouth daily. 30 tablet 5  . ZOLPIDEM TARTRATE 10 MG PO TABS Oral Take 1 tablet (10 mg total) by mouth at bedtime as needed. 30 tablet 0  . DOXYCYCLINE HYCLATE 100 MG PO CAPS Oral Take 1 capsule (100 mg  total) by mouth 2 (two) times daily. 14 capsule 0  . NITROGLYCERIN 0.4 MG SL SUBL Sublingual Place 1 tablet (0.4 mg total) under the tongue every 5 (five) minutes as needed for chest pain. 25 tablet 3  . OXYCODONE-ACETAMINOPHEN 5-325 MG PO TABS Oral Take 1-2 tablets by mouth every 6 (six) hours as needed for pain. 10 tablet 0    BP 135/72  Pulse 58  Temp(Src) 98.2 F (36.8 C) (Oral)  Resp 18  SpO2 96%  Physical Exam  Constitutional: He appears well-developed.  HENT:  Head: Normocephalic.  Eyes: Pupils are equal, round, and reactive to light.  Musculoskeletal: Normal range of motion.       Mild bilateral lower extremity pitting edema. Erythema to right lateral lower leg. No induration. Dorsalis pedis pulse intact. No fluctuance.  Neurological: He is alert.    ED Course  Procedures (including critical care time)  Labs Reviewed - No data to display No results found.   1. Cellulitis       MDM  Patient of lower extremity redness and mild swelling. Sent in to rule out DVT. Negative Doppler. Treat with antibiotics.        Juliet Rude. Rubin Payor, MD 04/11/12 1857

## 2012-04-11 NOTE — ED Notes (Signed)
Discharge instructions reviewed;  Verbalizes understanding.  No questions asked; no further c/o's voiced.  Pt ambulatory to lobby without distress.

## 2012-04-11 NOTE — ED Provider Notes (Signed)
History     CSN: [id number]  Arrival date & time 04/11/12  1353   None     Chief Complaint  Patient presents with  . Cellulitis    (Consider location/radiation/quality/duration/timing/severity/associated sxs/prior treatment) HPI Comments: Patient presents with complaints of right calf tenderness, erythema and warmth to the touch. He has also noticed swelling of his right lower leg. Symptoms began one to 2 days ago. He has had cellulitis in the past. He denies any trauma to his right lower leg. He has had no recent travel, prolonged bed rest or surgery. Patient does have a history of atrial fibrillation and states he was recently cardioverted within the last couple of weeks. He is not on Coumadin therapy.   Past Medical History  Diagnosis Date  . HYPERLIPIDEMIA 03/22/2009  . Obesity, unspecified 04/24/2009  . THROMBOCYTOPENIA 08/16/2010  . ERECTILE DYSFUNCTION 03/22/2009  . SLEEP APNEA, OBSTRUCTIVE 03/22/2009    compliant with CPAP  . 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 Coronary atherosclerosis of native coronary artery 11/2002    a. s/p stent to LAD 12/03; OM2 occluded at cath 12/03; d. myoview 5/10: no ischemia;  e. echo 7/11: EF 55%, BAE, mild RVE, PASP 41-45  . Atrial fibrillation 03/22/2009    a. s/p multiple DCCV; b. no coumadin due to low TE risk profile; c. Tikosyn Rx  . GERD 03/22/2009  . LATERAL EPICONDYLITIS, LEFT 10/24/2009  . HYPERGLYCEMIA 04/25/2010  . LIVER FUNCTION TESTS, ABNORMAL 04/25/2010  . TOBACCO USE, QUIT 10/24/2009  . Umbilical hernia   . Myocardial infarction mi2003    Past Surgical History  Procedure Date  . Cyst on epiglottis 08/2002  . Surgery l4-l5  1998    ruptured x 3  . Stent     LAD DUS 2004  . Ulnar neuropathy   . Umbilical hernia repair   . Lumbar disc surgery     two  holes in spinalcovering  . Esophagogastroduodenoscopy 11/15/2011    Procedure: ESOPHAGOGASTRODUODENOSCOPY (EGD);  Surgeon: Charna Elizabeth, MD;  Location: [facility];  Service:  Endoscopy;  Laterality: N/A;  . Colonoscopy 11/15/2011    Procedure: COLONOSCOPY;  Surgeon: Charna Elizabeth, MD;  Location: [facility];  Service: Endoscopy;  Laterality: N/A;    Family History  Problem Relation Age of Onset  . Thyroid disease Mother   . Ovarian cancer Mother   . Breast cancer Mother   . Lung cancer Father   . Cancer Father     History  Substance Use Topics  . Smoking status: Former Smoker -- 2.0 packs/day for 42 years    Types: Cigarettes    Quit date: 12/30/2008  . Smokeless tobacco: Never Used   Comment: started at age 36; 1-2 ppd; quit 2010  . Alcohol Use: Yes     socially      Review of Systems  Constitutional: Negative for fever and chills.  Musculoskeletal: Negative for joint swelling.  Skin: Positive for color change. Negative for rash and wound.    Allergies  Penicillins; Procaine hcl; Pseudoephedrine; and Sulfonamide derivatives  Home Medications   Current Outpatient Rx  Name Route Sig Dispense Refill  . ASPIRIN 325 MG PO TABS Oral Take 325 mg by mouth daily.    . ATORVASTATIN CALCIUM 80 MG PO TABS  TAKE 1 TABLET DAILY 90 tablet 0  . DIAZEPAM 5 MG PO TABS  Bring to office with you for the procedure 2 tablet 0  . DOFETILIDE 500 MCG PO CAPS Oral Take 1 capsule (500 mcg  total) by mouth 2 (two) times daily. 60 capsule 6  . FAMOTIDINE 10 MG PO TABS Oral Take 10 mg by mouth as needed.     . OMEGA-3 FATTY ACIDS 1000 MG PO CAPS Oral Take 2 g by mouth daily.      [name] MAGNESIUM GLUCONATE 500 MG PO TABS  Pt takes OTC MAGNESIUM 400 mg daily    . MULTIPLE VITAMIN PO Oral Take 1 tablet by mouth daily.     [name] NITROGLYCERIN 0.4 MG SL SUBL Sublingual Place 1 tablet (0.4 mg total) under the tongue every 5 (five) minutes as needed for chest pain. 25 tablet 3  . PANTOPRAZOLE SODIUM 40 MG PO TBEC Oral Take 1 tablet (40 mg total) by mouth daily. 90 tablet 3  . POTASSIUM CHLORIDE CRYS ER 20 MEQ PO TBCR Oral Take 1 tablet (20 mEq total) by mouth daily. 30 tablet 5  .  ZOLPIDEM TARTRATE 10 MG PO TABS Oral Take 1 tablet (10 mg total) by mouth at bedtime as needed. 30 tablet 0    BP 121/70  Pulse 60  Temp(Src) 98.2 F (36.8 C) (Oral)  Resp 20  SpO2 98%  Physical Exam  Nursing note and vitals reviewed. Constitutional: He appears well-developed and well-nourished. No distress.  HENT:  Head: Normocephalic and atraumatic.  Cardiovascular: Normal rate, regular rhythm and normal heart sounds.   Pulmonary/Chest: Effort normal and breath sounds normal. No respiratory distress.  Musculoskeletal:       Right lower leg: He exhibits tenderness and edema. He exhibits no bony tenderness, no deformity and no laceration.       Legs: Skin: Skin is warm and dry.  Psychiatric: He has a normal mood and affect.    ED Course  Procedures (including critical care time)  Labs Reviewed - No data to display No results found.   1. Calf pain       MDM  Pt transferred to r/o DVT of RLE due to erythema, calf pain and swelling despite low Wells score.         Melody Comas, [place] 04/11/12 [phone]

## 2012-04-11 NOTE — ED Notes (Signed)
Pt has red, warm area on back of rt lower leg since last pm, has hx of cellulitis in the past.

## 2012-04-11 NOTE — ED Provider Notes (Signed)
Medical screening examination/treatment/procedure(s) were performed by non-physician practitioner and as supervising physician I was immediately available for consultation/collaboration.  Leslee Home, M.D.   Reuben Likes, MD 04/11/12 [phone]

## 2012-04-22 ENCOUNTER — Encounter: Payer: Self-pay | Admitting: Cardiology

## 2012-04-22 ENCOUNTER — Other Ambulatory Visit (INDEPENDENT_AMBULATORY_CARE_PROVIDER_SITE_OTHER): Payer: 59

## 2012-04-22 ENCOUNTER — Ambulatory Visit (INDEPENDENT_AMBULATORY_CARE_PROVIDER_SITE_OTHER): Payer: 59 | Admitting: Cardiology

## 2012-04-22 VITALS — BP 125/76 | HR 60 | Ht 73.0 in | Wt 345.0 lb

## 2012-04-22 DIAGNOSIS — Z0000 Encounter for general adult medical examination without abnormal findings: Secondary | ICD-10-CM

## 2012-04-22 DIAGNOSIS — I4891 Unspecified atrial fibrillation: Secondary | ICD-10-CM

## 2012-04-22 DIAGNOSIS — E785 Hyperlipidemia, unspecified: Secondary | ICD-10-CM

## 2012-04-22 DIAGNOSIS — I2510 Atherosclerotic heart disease of native coronary artery without angina pectoris: Secondary | ICD-10-CM

## 2012-04-22 LAB — CBC WITH DIFFERENTIAL/PLATELET
Basophils Absolute: 0 10*3/uL (ref 0.0–0.1)
Basophils Relative: 0.3 % (ref 0.0–3.0)
Eosinophils Absolute: 0.6 10*3/uL (ref 0.0–0.7)
Eosinophils Relative: 8 % — ABNORMAL HIGH (ref 0.0–5.0)
HCT: 47.8 % (ref 39.0–52.0)
Hemoglobin: 16.1 g/dL (ref 13.0–17.0)
Lymphocytes Relative: 26.7 % (ref 12.0–46.0)
Lymphs Abs: 2 10*3/uL (ref 0.7–4.0)
MCHC: 33.6 g/dL (ref 30.0–36.0)
MCV: 92.8 fl (ref 78.0–100.0)
Monocytes Absolute: 0.6 10*3/uL (ref 0.1–1.0)
Monocytes Relative: 7.7 % (ref 3.0–12.0)
Neutro Abs: 4.2 10*3/uL (ref 1.4–7.7)
Neutrophils Relative %: 57.3 % (ref 43.0–77.0)
Platelets: 125 10*3/uL — ABNORMAL LOW (ref 150.0–400.0)
RBC: 5.16 Mil/uL (ref 4.22–5.81)
RDW: 13.6 % (ref 11.5–14.6)
WBC: 7.3 10*3/uL (ref 4.5–10.5)

## 2012-04-22 LAB — POCT URINALYSIS DIPSTICK
Bilirubin, UA: NEGATIVE
Blood, UA: NEGATIVE
Glucose, UA: NEGATIVE
Ketones, UA: NEGATIVE
Leukocytes, UA: NEGATIVE
Nitrite, UA: NEGATIVE
Protein, UA: NEGATIVE
Spec Grav, UA: 1.025
Urobilinogen, UA: 0.2
pH, UA: 6

## 2012-04-22 LAB — LIPID PANEL
Cholesterol: 113 mg/dL (ref 0–200)
HDL: 28.4 mg/dL — ABNORMAL LOW (ref >39.00)
LDL Cholesterol: 59 mg/dL (ref 0–99)
Total CHOL/HDL Ratio: 4
Triglycerides: 130 mg/dL (ref 0.0–149.0)
VLDL: 26 mg/dL (ref 0.0–40.0)

## 2012-04-22 LAB — HEPATIC FUNCTION PANEL
ALT: 59 U/L — ABNORMAL HIGH (ref 0–53)
AST: 31 U/L (ref 0–37)
Albumin: 4.1 g/dL (ref 3.5–5.2)
Alkaline Phosphatase: 66 U/L (ref 39–117)
Bilirubin, Direct: 0.2 mg/dL (ref 0.0–0.3)
Total Bilirubin: 1.1 mg/dL (ref 0.3–1.2)
Total Protein: 6.8 g/dL (ref 6.0–8.3)

## 2012-04-22 LAB — BASIC METABOLIC PANEL
BUN: 18 mg/dL (ref 6–23)
CO2: 28 mEq/L (ref 19–32)
Calcium: 9.5 mg/dL (ref 8.4–10.5)
Chloride: 104 mEq/L (ref 96–112)
Creatinine, Ser: 0.8 mg/dL (ref 0.4–1.5)
GFR: 108.65 mL/min (ref >60.00)
Glucose, Bld: 140 mg/dL — ABNORMAL HIGH (ref 70–99)
Potassium: 4.3 mEq/L (ref 3.5–5.1)
Sodium: 139 mEq/L (ref 135–145)

## 2012-04-22 LAB — HEMOGLOBIN A1C: Hgb A1c MFr Bld: 7 % — ABNORMAL HIGH (ref 4.6–6.5)

## 2012-04-22 LAB — TSH: TSH: 1.21 u[IU]/mL (ref 0.35–5.50)

## 2012-04-22 LAB — PSA: PSA: 0.82 ng/mL (ref 0.10–4.00)

## 2012-04-22 NOTE — Assessment & Plan Note (Signed)
Patient remains in sinus rhythm. Continue Tikosyn. CHADS score is 0. Continue aspirin for coronary disease. Hopefully his antiarrhythmic will keep his advanced at a minimum. He has been seen by Dr. Johney Frame in the past for consideration of ablation. He was felt to be a candidate if he lost weight.

## 2012-04-22 NOTE — Patient Instructions (Signed)
Your physician wants you to follow-up in: 6 MONTHS WITH DR CRENSHAW You will receive a reminder letter in the mail two months in advance. If you don't receive a letter, please call our office to schedule the follow-up appointment.  

## 2012-04-22 NOTE — Assessment & Plan Note (Signed)
Continue statin. 

## 2012-04-22 NOTE — Assessment & Plan Note (Signed)
Continue aspirin and statin. 

## 2012-04-22 NOTE — Progress Notes (Signed)
HPI: [name] is a pleasant gentleman who has a history of coronary artery disease status post stent to his LAD in December 2003 as well as paroxysmal atrial fibrillation. Last Myoview was in March 2013. There was no ischemia or infarction. The calculated ejection fraction was 51%. Admitted with frequent PVCs in July 2011. Noted to be bradycardic and beta blocker changed to p.r.n. TSH and enzymes were normal. Last echocardiogram in July 2011 showed normal LV function, mild biatrial enlargement and mild right ventricular enlargement. Patient on Tikosyn for afib. Recurrent episode in May and June of 2012. Saw Dr. Johney [name] in F/U and AFib ablation could be considered if patient lost weight. I last saw him in March of 2013. After seeing him he developed an episode of atrial fibrillation that required cardioversion. Since then there is no dyspnea, chest pain, palpitations or syncope.   Current Outpatient Prescriptions  Medication Sig Dispense Refill  . aspirin 325 MG tablet Take 325 mg by mouth daily.      [name] atorvastatin (LIPITOR) 80 MG tablet TAKE 1 TABLET DAILY  90 tablet  0  . b complex vitamins tablet Take 1 tablet by mouth daily.      [name] dofetilide (TIKOSYN) 500 MCG capsule Take 1 capsule (500 mcg total) by mouth 2 (two) times daily.  60 capsule  6  . fish oil-omega-3 fatty acids 1000 MG capsule Take 2 g by mouth daily.        . magnesium gluconate (MAGONATE) 500 MG tablet Take 500 mg by mouth daily.       . MULTIPLE VITAMIN PO Take 1 tablet by mouth daily.       . nitroGLYCERIN (NITROSTAT) 0.4 MG SL tablet Place 1 tablet (0.4 mg total) under the tongue every 5 (five) minutes as needed for chest pain.  25 tablet  3  . pantoprazole (PROTONIX) 40 MG tablet Take 1 tablet (40 mg total) by mouth daily.  90 tablet  3  . potassium chloride SA (KLOR-CON M20) 20 MEQ tablet Take 1 tablet (20 mEq total) by mouth daily.  30 tablet  5  . zolpidem (AMBIEN) 10 MG tablet Take 1 tablet (10 mg total) by mouth at  bedtime as needed.  30 tablet  0  . doxycycline (VIBRAMYCIN) 100 MG capsule Take 1 capsule (100 mg total) by mouth 2 (two) times daily.  14 capsule  0  . oxyCODONE-acetaminophen (PERCOCET) 5-325 MG per tablet Take 1-2 tablets by mouth every 6 (six) hours as needed for pain.  10 tablet  0     Past Medical History  Diagnosis Date  . HYPERLIPIDEMIA 03/22/2009  . Obesity, unspecified 04/24/2009  . THROMBOCYTOPENIA 08/16/2010  . ERECTILE DYSFUNCTION 03/22/2009  . SLEEP APNEA, OBSTRUCTIVE 03/22/2009    compliant with CPAP  . 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 Coronary atherosclerosis of native coronary artery 11/2002    a. s/p stent to LAD 12/03; OM2 occluded at cath 12/03; d. myoview 5/10: no ischemia;  e. echo 7/11: EF 55%, BAE, mild RVE, PASP 41-45  . Atrial fibrillation 03/22/2009    a. s/p multiple DCCV; b. no coumadin due to low TE risk profile; c. Tikosyn Rx  . GERD 03/22/2009  . LATERAL EPICONDYLITIS, LEFT 10/24/2009  . HYPERGLYCEMIA 04/25/2010  . LIVER FUNCTION TESTS, ABNORMAL 04/25/2010  . TOBACCO USE, QUIT 10/24/2009  . Umbilical hernia   . Myocardial infarction mi2003    Past Surgical History  Procedure Date  . Cyst on epiglottis 08/2002  .  Surgery l4-l5  1998    ruptured x 3  . Stent     LAD DUS 2004  . Ulnar neuropathy   . Umbilical hernia repair   . Lumbar disc surgery     two  holes in spinalcovering  . Esophagogastroduodenoscopy 11/15/2011    Procedure: ESOPHAGOGASTRODUODENOSCOPY (EGD);  Surgeon: Charna Elizabeth, MD;  Location: [facility];  Service: Endoscopy;  Laterality: N/A;  . Colonoscopy 11/15/2011    Procedure: COLONOSCOPY;  Surgeon: Charna Elizabeth, MD;  Location: [facility];  Service: Endoscopy;  Laterality: N/A;    History   Social History  . Marital Status: Married    Spouse Name: N/A    Number of Children: N/A  . Years of Education: N/A   Occupational History  . Not on file.   Social History Main Topics  . Smoking status: Former Smoker -- 2.0  packs/day for 42 years    Types: Cigarettes    Quit date: 12/30/2008  . Smokeless tobacco: Never Used   Comment: started at age 41; 1-2 ppd; quit 2010  . Alcohol Use: Yes     socially  . Drug Use: No  . Sexually Active: Yes   Other Topics Concern  . 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 Remaining systems are negative.  Physical Exam: Well-developed obese in no acute distress.  Skin is warm and dry.  HEENT is normal.  Neck is supple. No thyromegaly.  Chest is clear to auscultation with normal expansion.  Cardiovascular exam is regular rate and rhythm.  Abdominal exam nontender or distended. No masses palpated. Extremities show trace edema. neuro grossly intact

## 2012-04-29 ENCOUNTER — Ambulatory Visit (INDEPENDENT_AMBULATORY_CARE_PROVIDER_SITE_OTHER): Payer: 59 | Admitting: Internal Medicine

## 2012-04-29 ENCOUNTER — Encounter: Payer: Self-pay | Admitting: Internal Medicine

## 2012-04-29 VITALS — BP 108/70 | HR 76 | Temp 98.2°F | Ht 73.0 in | Wt 343.0 lb

## 2012-04-29 DIAGNOSIS — G4733 Obstructive sleep apnea (adult) (pediatric): Secondary | ICD-10-CM

## 2012-04-29 DIAGNOSIS — H33319 Horseshoe tear of retina without detachment, unspecified eye: Secondary | ICD-10-CM | POA: Insufficient documentation

## 2012-04-29 DIAGNOSIS — E785 Hyperlipidemia, unspecified: Secondary | ICD-10-CM

## 2012-04-29 DIAGNOSIS — E119 Type 2 diabetes mellitus without complications: Secondary | ICD-10-CM

## 2012-04-29 DIAGNOSIS — K219 Gastro-esophageal reflux disease without esophagitis: Secondary | ICD-10-CM

## 2012-04-29 DIAGNOSIS — Z2911 Encounter for prophylactic immunotherapy for respiratory syncytial virus (RSV): Secondary | ICD-10-CM

## 2012-04-29 DIAGNOSIS — I4891 Unspecified atrial fibrillation: Secondary | ICD-10-CM

## 2012-04-29 DIAGNOSIS — E669 Obesity, unspecified: Secondary | ICD-10-CM

## 2012-04-29 DIAGNOSIS — D696 Thrombocytopenia, unspecified: Secondary | ICD-10-CM

## 2012-04-29 DIAGNOSIS — Z0000 Encounter for general adult medical examination without abnormal findings: Secondary | ICD-10-CM

## 2012-04-29 DIAGNOSIS — Z23 Encounter for immunization: Secondary | ICD-10-CM

## 2012-04-29 DIAGNOSIS — I252 Old myocardial infarction: Secondary | ICD-10-CM

## 2012-04-29 MED ORDER — ATORVASTATIN CALCIUM 80 MG PO TABS: 80.0000 mg | ORAL_TABLET | Freq: Every day | ORAL | Status: DC

## 2012-04-29 MED ORDER — ZOLPIDEM TARTRATE 10 MG PO TABS: 10.0000 mg | ORAL_TABLET | Freq: Every evening | ORAL | Status: DC | PRN

## 2012-04-29 MED ORDER — METFORMIN HCL ER 500 MG PO TB24: 500.0000 mg | ORAL_TABLET | Freq: Every day | ORAL | Status: DC

## 2012-04-29 MED ORDER — PANTOPRAZOLE SODIUM 40 MG PO TBEC: 40.0000 mg | DELAYED_RELEASE_TABLET | Freq: Every day | ORAL | Status: DC

## 2012-04-29 NOTE — Patient Instructions (Addendum)
Your blood sugars now are in the diabetes range. It is very important that you lose weight and control the blood sugars and healthy manner. Track all oral intake and activity level this will help you change any bad habits that are detrimental to Your  health.  We will begin metformin 500 mg extended release to take one a day with breakfast meal.  Encourage dietary diabetes  Education referral when you are ready.  Call for referral.   Recheck laboratory studies in about 3 months with a hemoglobin A1c ,LFTs  Urine microalbumin  Ratio.   then office visit.  I will foreword this note and information to your cardiologist and have them opine  about anticoagulation because of your A. fib condition.    How to Increase Fiber in the Meal Plan for Diabetes Increasing fiber in the diet has many benefits including lowering blood cholesterol, helping to control blood glucose (sugar), preventing constipation, and aiding in weight management by helping you feel full longer. Start adding fiber to your diet slowly. A gradual substitution of high fiber foods for low fiber foods will allow the digestive tract to adjust. Most men under 37 years of age should aim to eat 38 g of fiber a day. Women should aim for 25 g. Over 48 years of age, most men need 30 g of fiber and most women need 21 g. Below are some suggestions for increasing fiber.  Try whole-wheat bread instead of white bread. Look for words high on the list of ingredients such as whole wheat, whole rye, or whole oats.   Try baked potato with skin instead of mashed potatoes.   Try a fresh apple with skin instead of applesauce.   Try a fresh orange instead of orange juice.   Try popcorn instead of potato chips.   Try bran cereal instead of corn flakes.   Try kidney, whole pinto, or garbanzo beans instead of bread.   Try whole-grain crackers instead of saltine crackers.   Try whole-wheat pasta instead of regular varieties.   While on a high fiber  diet, drink enough water and fluids to keep your urine clear or pale yellow.   Eat a variety of high fiber foods such as fruits, vegetables, whole grains, nuts, and seeds.   Try to increase your intake of fiber by eating high fiber foods instead of taking fiber pills or supplements that contain small amounts of fiber. There can be additional benefits for long-term health and blood glucose control with high fiber foods. Aim for 5 servings of fruit and vegetables per day.  SOURCES OF FIBER The following list shows the average dietary fiber for types of food in the various food groups. Starch and Bread / Dietary Fiber (g)  Whole-grain breads, 1 slice / 2 g   Whole grain,  cup / 2 g   Whole-grain cereals,  cup / 3 g   Bran cereals, ? to  cup / 8 g   Starchy vegetables,  cup / 3 g   Legumes (beans, peas, lentils),  cup / 8 g   Oatmeal,  cup / 2 g   Whole-wheat pasta, ? cup / 2 g   Brown rice,  cup / 2 g   Barley,  cup / 3 g  Meat and Meat Substitutes / Dietary Fiber (g) This group averages 0 grams of fiber. Exceptions are:  Nuts, seeds, 1 oz or  cup / 3 g   Chunky peanut butter, 2 tbs / 3 g  Vegetables / Dietary Fiber (g)  Cooked vegetables,  to  cup / 2 to 3 g   Raw vegetables, 1 to 2 cups / 2 to 3 g  Fruit / Dietary Fiber (g)  Raw or cooked fruit,  cup or 1 small, fresh piece / 2 g  Milk / Dietary Fiber (g)  Milk, 1 cup or 8 oz / 0 g  Fats and Oils / Dietary Fiber (g)  Fats and oils, 1 tsp / 0 g  You can determine how much fiber you are eating by reading the Nutrition Facts panel on the labels of the foods you eat. FIBER IN SPECIFIC FOODS Cereals / Dietary Fiber (g)  All Bran, ? cup / 9 g   All Bran with Extra Fiber,  cup / 13 g   Bran Flakes,  cup / 4 g   Cheerios,  cup / 1.5 g   Corn Bran,  cup / 4 g   Corn Flakes,  cup / 0.75 g   Cracklin' Oat Bran,  cup / 4 g   Fiber One,  cup / 13 g   Grape Nuts, 3 tbs / 3 g   Grape  Nuts Flakes,  cup / 3 g   Noodles,  cup, cooked / 0.5 g   Nutrigrain Wheat,  cup / 3.5 g   Oatmeal,  cup, cooked / 1.1 g   Pasta, white (macaroni, spaghetti),  cup, cooked / 0.5 g   Pasta, whole-wheat (macaroni, spaghetti),  cup, cooked / 2 g   [place],  cup, cooked / 3 g   Rice, wild, ? cup, cooked / 0.5 g   Rice, brown,  cup, cooked / 1 g   Rice, white,  cup, cooked / 0.2 g   Shredded Wheat, bite-sized,  cup / 2 g   Total,  cup / 1.75 g   Wheat Chex,  cup / 2.5 g   [place],  cup, cooked / 4 g   Wheaties,  cup / 2.75 g  Bread, Starchy Vegetables, and Dried Peas and Beans / Dietary Fiber (g)  Bagel, whole / 0.6 g   Baked beans in tomato sauce,  cup, cooked / 3 g   Bran muffin, 1 small / 2.5 g   Bread, cracked wheat, 1 slice / 2.5 g   Bread, pumpernickel, 1 slice / 2.5 g   Bread, white, 1 slice / 0.4 g   Bread, whole-wheat, 1 slice / 1.4 g   Corn,  cup, canned / 2.9 g   Kidney beans, ? cup, cooked / 3.5 g   Lentils, ? cup, cooked / 3 g   Lima beans,  cup, cooked / 4 g   [place]y beans, ? cup, cooked / 4 g   Peas,  cup, cooked / 4 g   Popcorn, 3 cups popped, unbuttered / 3.5 g   Potato, baked (with skin), 1 small / 4 g   Potato, baked (without skin), 1 small / 2 g   Ry-Krisp, 4 crackers / 3 g   Saltine crackers, 6 squares / 0 g   Split peas, ? cup, cooked / 2.5 g   Yams (sweet potato), ? cup / 1.7 g  Fruit / Dietary Fiber (g)  Apple, 1 small, fresh, with skin / 4 g   Apple juice,  cup / 0.4 g   Apricots, 4 medium, fresh / 4 g   Apricots, 7 halves, dried / 2 g   Banana,  medium / 1.2 g  Blueberries,  cup / 2 g   Cantaloupe, ? melon / 1.3 g   Cherries,  cup, canned / 1.4 g   Grapefruit,  medium / 1.6 g   Grapes, 15 small / 1.2 g   Grape juice, ? cup / 0.5 g   Orange, 1 medium, fresh / 2 g   Orange juice,  cup / 0.5 g   Peach, 1 medium,fresh, with skin / 2 g   Pear, 1 medium, fresh, with skin  / 4 g   Pineapple, ? cup, canned / 0.7 g   Plums, 2 whole / 2 g   Prunes, 3 whole / 1.5 g   Raspberries, 1 cup / 6 g   Strawberries, 1  cup / 4 g   Watermelon, 1  cup / 0.5 g  Vegetables / Dietary Fiber (g)  Asparagus,  cup, cooked / 1 g   Beans, green and wax,  cup, cooked / 1.6 g   Beets,  cup, cooked / 1.8 g   Broccoli,  cup, cooked / 2.2 g   Brussels sprouts,  cup, cooked / 4 g   Cabbage,  cup, cooked / 2.5 g   Carrots,  cup, cooked / 2.3 g   Cauliflower,  cup, cooked / 1.1 g   Celery, 1 cup, raw / 1.5 g   Cucumber, 1 cup, raw / 0.8 g   Green pepper,  cup sliced, cooked / 1.5 g   Lettuce, 1 cup, sliced / 0.9 g   Mushrooms, 1 cup sliced, raw / 1.8 g   Onion, 1 cup sliced, raw / 1.6 g   Spinach,  cup, cooked / 2.4 g   Tomato, 1 medium, fresh / 1.5 g   Tomato juice,  cup / 0 g   Zucchini,  cup, cooked / 1.8 g  Document Released: 06/07/2002 Document Revised: 12/05/2011 Document Reviewed: 07/04/2009 [facility] Patient Information 2012 [place], [place].Diabetes, Eating Away From Home Sometimes, you might eat in a restaurant or have meals that are prepared by someone else. You can enjoy eating out. However, the portions in restaurants may be much larger than needed. Listed below are some ideas to help you choose foods that will keep your blood glucose (sugar) in better control.  TIPS FOR EATING OUT  Know your meal plan and how many carbohydrate servings you should have at each meal. You may wish to carry a copy of your meal plan in your purse or wallet. Learn the foods included in each food group.   Make a list of restaurants near you that offer healthy choices. Take a copy of the carry-out menus to see what they offer. Then, you can plan what you will order ahead of time.   Become familiar with serving sizes by practicing them at home using measuring cups and spoons. Once you learn to recognize portion sizes, you will be able to correctly estimate  the amount of total carbohydrate you are allowed to eat at the restaurant. Ask for a takeout box if the portion is more than you should have. When your food comes, leave the amount you should have on the plate, and put the rest in the takeout box before you start eating.   Plan ahead if your mealtime will be different from usual. Check with your caregiver to find out how to time meals and medicine if you are taking insulin.   Avoid high-fat foods, such as fried foods, cream sauces, high-fat salad dressings, or any added butter or  margarine.   Do not be afraid to ask questions. Ask your server about the portion size, cooking methods, ingredients and if items can be substituted. Restaurants do not list all available items on the menu. You can ask for your main entree to be prepared using skim milk, oil instead of butter or margarine, and without gravy or sauces. Ask your waiter or waitress to serve salad dressings, gravy, sauces, margarine, and sour cream on the side. You can then add the amount your meal plan suggests.   Add more vegetables whenever possible.   Avoid items that are labeled "jumbo," "giant," "deluxe," or "supersized."   You may want to split an entre with someone and order an extra side salad.   Watch for hidden calories in foods like croutons, bacon, or cheese.   Ask your server to take away the bread basket or chips from your table.   Order a dinner salad as an appetizer.  You can eat most foods served in a restaurant. Some foods are better choices than others. Breads and Starches  Recommended: All kinds of bread (wheat, rye, white, oatmeal, [place], [place], raisin), hard or soft dinner rolls, frankfurter or hamburger buns, small bagels, small corn or whole-wheat flour tortillas.   Avoid: Frosted or glazed breads, butter rolls, egg or cheese breads, croissants, sweet rolls, pastries, coffee cake, glazed or frosted doughnuts, muffins.  Crackers  Recommended: Animal  crackers, graham, rye, saltine, oyster, and matzoth crackers. Bread sticks, melba toast, rusks, pretzels, popcorn (without fat), zwieback toast.   Avoid: High-fat snack crackers or chips. Buttered popcorn.  Cereals  Recommended: Hot and cold cereals. Whole grains such as oatmeal or shredded wheat are good choices.   Avoid: Sugar-coated or granola type cereals.  Potatoes/Pasta/Rice/Beans  Recommended: Order baked, boiled, or mashed potatoes, rice or noodles without added fat, whole beans. Order gravies, butter, margarine, or sauces on the side so you can control the amount you add.   Avoid: Hash browns or fried potatoes. Potatoes, pasta, or rice prepared with cream or cheese sauce. Potato or pasta salads prepared with large amounts of dressing. Fried beans or fried rice.  Vegetables  Recommended: Order steamed, baked, boiled, or stewed vegetables without sauces or extra fat. Ask that sauce be served on the side. If vegetables are not listed on the menu, ask what is available.   Avoid: Vegetables prepared with cream, butter, or cheese sauce. Fried vegetables.  Salad Bars  Recommended: Many of the vegetables at a salad bar are considered "free." Use lemon juice, vinegar, or low-calorie salad dressing (fewer than 20 calories per serving) as "free" dressings for your salad. Look for salad bar ingredients that have no added fat or sugar such as tomatoes, lettuce, cucumbers, broccoli, carrots, onions, and mushrooms.   Avoid: Prepared salads with large amounts of dressing, such as coleslaw, caesar salad, macaroni salad, bean salad, or carrot salad.  Fruit  Recommended: Eat fresh fruit or fresh fruit salad without added dressing. A salad bar often offers fresh fruit choices, but canned fruit at a restaurant is usually packed in sugar or syrup.   Avoid: Sweetened canned or frozen fruits, plain or sweetened fruit juice. Fruit salads with dressing, sour cream, or sugar added to them.  Meat and Meat  Substitutes  Recommended: Order broiled, baked, roasted, or grilled meat, poultry, or fish. Trim off all visible fat. Do not eat the skin of poultry. The size stated on the menu is the raw weight. Meat shrinks by  in cooking (  for example, 4 oz raw equals 3 oz cooked meat).   Avoid: Deep-fat fried meat, poultry, or fish. Breaded meats.  Eggs  Recommended: Order soft, hard-cooked, poached, or scrambled eggs. Omelets may be okay, depending on what ingredients are added. Egg substitutes are also a good choice.   Avoid: Fried eggs, eggs prepared with cream or cheese sauce.  Milk  Recommended: Order low-fat or fat-free milk according to your meal plan. Plain, nonfat yogurt or flavored yogurt with no sugar added may be used as a substitute for milk. Soy milk may also be used.   Avoid: Milk shakes or sweetened milk beverages.  Soups and Combination Foods  Recommended: Clear broth or consomm are "free" foods and may be used as an appetizer. Broth-based soups with fat removed count as a starch serving and are preferred over cream soups. Soups made with beans or split peas may be eaten but count as a starch.   Avoid: Fatty soups, soup made with cream, cheese soup. Combination foods prepared with excessive amounts of fat or with cream or cheese sauces.  Desserts and Sweets  Recommended: Ask for fresh fruit. Sponge or angel food cake without icing, ice milk, no sugar added ice cream, sherbet, or frozen yogurt may fit into your meal plan occasionally.   Avoid: Pastries, puddings, pies, cakes with icing, custard, gelatin desserts.  Fats and Oils  Recommended: Choose healthy fats such as olive oil, canola oil, or tub margarine, reduced fat or fat-free sour cream, cream cheese, avocado, or nuts.   Avoid: Any fats in excess of your allowed portion. Deep-fried foods or any food with a large amount of fat.  Note: Ask for all fats to be served on the side, and limit your portion sizes according to your  meal plan. Document Released: 12/16/2005 Document Revised: 12/05/2011 Document Reviewed: 07/06/2009 [facility] Patient Information 2012 [place], [place].

## 2012-04-29 NOTE — Progress Notes (Signed)
Subjective:    Patient ID: [name] [name], male    DOB: Sep 23, 1951, 61 y.o.   MRN: [id number]  HPI Patient comes in today for preventive visit and follow-up of medical issues. Update  history since  last visit:   GERD  Better since endo needs refill of medication Petechial hem ? From Cardioversion  Saw Dr Luciana Axe  Who said .  Concern about retinal tear.  To fu in 6 months  To take vt b 12 supplement . Has 6 months fu.   Recurrent AF had now had ? 7 Cv and meds  recently had Stress test.   Ok on Tikosyn  Recently went to the urgent care emergency room for leg swelling and redness that turned out to be cellulitis and he was treated with doxycycline it is a lot better. No blood clots.  Obstructive sleep apnea under treatment still has sleep issues needs a refill on the Ambien  Lipids on medications Lipitor needs a refill.    Review of Systems ROS:  GEN/ HEENT: No fever, significant weight changes sweats headaches vision problems has  glasses   hearing changes, CV/ PULM; No chest pain shortness of breath cough, syncope,edema  change in exercise tolerance. GI /GU: No adominal pain, vomiting, change in bowel habits. No blood in the stool. No significant GU symptoms. SKIN/HEME: ,no acute skin rashes suspicious lesions or bleeding. No lymphadenopathy, nodules, masses.  NEURO/ PSYCH:  No neurologic signs such as weakness numbness. No depression anxiety. IMM/ Allergy: No unusual infections.  Allergy .   REST of 12 system review negative except as per HPI Outpatient Encounter Prescriptions as of 04/29/2012  Medication Sig Dispense Refill  . aspirin 325 MG tablet Take 325 mg by mouth daily.      [name] atorvastatin (LIPITOR) 80 MG tablet TAKE 1 TABLET DAILY  90 tablet  0  . b complex vitamins tablet Take 1 tablet by mouth daily.      [name] dofetilide (TIKOSYN) 500 MCG capsule Take 1 capsule (500 mcg total) by mouth 2 (two) times daily.  60 capsule  6  . fish oil-omega-3 fatty acids 1000 MG capsule Take  2 g by mouth daily.        . magnesium gluconate (MAGONATE) 500 MG tablet Take 500 mg by mouth daily.       . MULTIPLE VITAMIN PO Take 1 tablet by mouth daily.       . nitroGLYCERIN (NITROSTAT) 0.4 MG SL tablet Place 1 tablet (0.4 mg total) under the tongue every 5 (five) minutes as needed for chest pain.  25 tablet  3  . pantoprazole (PROTONIX) 40 MG tablet Take 1 tablet (40 mg total) by mouth daily.  90 tablet  3  . potassium chloride SA (KLOR-CON M20) 20 MEQ tablet Take 1 tablet (20 mEq total) by mouth daily.  30 tablet  5  . zolpidem (AMBIEN) 10 MG tablet Take 1 tablet (10 mg total) by mouth at bedtime as needed.  30 tablet  0   Past history family history social history reviewed in the electronic medical record.     Objective:   Physical Exam BP 108/70  Pulse 76  Temp(Src) 98.2 F (36.8 C) (Oral)  Ht 6\' 1"  (1.854 m)  Wt 343 lb (155.584 kg)  BMI 45.25 kg/m2  SpO2 95% Physical Exam: Vital signs reviewed NWG:NFAO is a well-developed well-nourished alert cooperative  White male  who appears   stated age in no acute distress.  HEENT: normocephalic  traumatic , Eyes: PERRL EOM's full, conjunctiva clear, Nares: patent no deformity discharge or tenderness., Ears: no deformity EAC's clear TMs with normal landmarks. Mouth: clear OP, no lesions, edema.  Moist mucous membranes. Dentition in adequate repair. NECK: supple without masses, thyromegaly or bruits. CHEST/PULM:  Clear to auscultation and percussion breath sounds equal no wheeze , rales or rhonchi. No chest wall deformities or tenderness. CV: PMI is nondisplaced, S1 S2 no gallops, murmurs, rubs. Peripheral pulses are full without delay.No JVD .  ABDOMEN: Bowel sounds normal nontender  No guard or rebound, no hepato splenomegal no CVA tenderness.  No hernia. Extremtities:  No clubbing cyanosis or edema, no acute joint swelling or redness no focal atrophy NEURO:  Oriented x3, cranial nerves 3-12 appear to be intact, no obvious focal  weakness,gait within normal limits no abnormal reflexes or asymmetrical SKIN: No acute rashes normal turgor, color, no bruising or petechiae. Except for her lower extremity he has some varicose veins without any ulcers and some chronic pigmentation no acute edema cracking or streaking. PSYCH: Oriented, good eye contact, no obvious depression anxiety, cognition and judgment appear normal. LN:  No cervical axillary or inguinal adenopathy      Lab Results  Component Value Date   WBC 7.3 04/22/2012   HGB 16.1 04/22/2012   HCT 47.8 04/22/2012   PLT 125.0* 04/22/2012   GLUCOSE 140* 04/22/2012   CHOL 113 04/22/2012   TRIG 130.0 04/22/2012   HDL 28.40* 04/22/2012   LDLCALC 59 04/22/2012   ALT 59* 04/22/2012   AST 31 04/22/2012   NA 139 04/22/2012   K 4.3 04/22/2012   CL 104 04/22/2012   CREATININE 0.8 04/22/2012   BUN 18 04/22/2012   CO2 28 04/22/2012   TSH 1.21 04/22/2012   PSA 0.82 04/22/2012   INR 1.03 05/18/2011   HGBA1C 7.0* 04/22/2012      Assessment & Plan:  Preventive Health Care Counseled regarding healthy nutrition, exercise, sleep, injury prevention, calcium vit d and healthy weight . zostavax today  utd otherwise   HT  controlled OBesity  CAD stable Lipids at goal AF recurrence treated with cardioversion and medication. He now has criteria for the diagnosis of diabetes and should probably be on an anticoagulant such as Coumadin or pradaxa or xeralto  Discussed this with the patient he had been on Coumadin in the past. I will send a copy of the node to the flag to his cardiologist to have them followup about this new finding. Would also have him contact his ophthalmologist to make sure they agree there are no significant contraindications to the anticoagulation. SP cellulitis healing discussed local skin care. Hyperglycemia now in the diabetic range.  New onset diabetes Discussed  importance of intervention at this time we will begin metformin ER 500 mg 1 by mouth daily and he will  intensify lifestyle intervention at this time he is declining nutritional consult he can do Weight Watchers low glycemic foods tracking system weight loss in a healthy manner will be important.( has had hep b vaccine in the past ) Ho x 2  Obstructive sleep apnea under treatment  still has sleep problems okay to refill Ambien for a six-month use as needed risk benefits discussed. Thrombocytopenia mild without bleeding probably from medications has had a blood consult in the past GERD stable on meds  HX of retinal tear or petechiae felt from cardioversion following by retinal specialist.

## 2012-05-01 ENCOUNTER — Telehealth: Payer: Self-pay | Admitting: *Deleted

## 2012-05-01 NOTE — Telephone Encounter (Signed)
Pt had a reaction to his shingles injection this week....reddish, swelling, hot, tenderness.  Does he need it checked???

## 2012-05-01 NOTE — Telephone Encounter (Signed)
Spoke with pt and he is aware to watch the rash and make an appt for next week.

## 2012-05-01 NOTE — Telephone Encounter (Signed)
Called pt and he states he did not take his temperature but he does not feel like he has a temperature.  Per pt it is the size of a silver dollar.  Pt would like to know if he needs to have the site looked at.  Pt states the site does itch a little.  Pls advise.

## 2012-05-01 NOTE — Telephone Encounter (Signed)
Ok  Probably to go away .  Contacted Dr [name] and   He advises  Beginning pradaxa .  Please have him make ov for next week to discuss and  Initiate  Med.

## 2012-05-05 ENCOUNTER — Encounter: Payer: Self-pay | Admitting: Internal Medicine

## 2012-05-05 ENCOUNTER — Ambulatory Visit (INDEPENDENT_AMBULATORY_CARE_PROVIDER_SITE_OTHER): Payer: 59 | Admitting: Internal Medicine

## 2012-05-05 VITALS — BP 112/68 | HR 62 | Temp 98.3°F | Wt 340.0 lb

## 2012-05-05 DIAGNOSIS — E119 Type 2 diabetes mellitus without complications: Secondary | ICD-10-CM

## 2012-05-05 DIAGNOSIS — I2510 Atherosclerotic heart disease of native coronary artery without angina pectoris: Secondary | ICD-10-CM

## 2012-05-05 DIAGNOSIS — I4891 Unspecified atrial fibrillation: Secondary | ICD-10-CM

## 2012-05-05 DIAGNOSIS — T8062XA Other serum reaction due to vaccination, initial encounter: Secondary | ICD-10-CM

## 2012-05-05 DIAGNOSIS — T881XXA Other complications following immunization, not elsewhere classified, initial encounter: Secondary | ICD-10-CM

## 2012-05-05 DIAGNOSIS — Z5181 Encounter for therapeutic drug level monitoring: Secondary | ICD-10-CM

## 2012-05-05 DIAGNOSIS — G4733 Obstructive sleep apnea (adult) (pediatric): Secondary | ICD-10-CM

## 2012-05-05 DIAGNOSIS — Z79899 Other long term (current) drug therapy: Secondary | ICD-10-CM

## 2012-05-05 DIAGNOSIS — Z7901 Long term (current) use of anticoagulants: Secondary | ICD-10-CM | POA: Insufficient documentation

## 2012-05-05 HISTORY — DX: Other complications following immunization, not elsewhere classified, initial encounter: T88.1XXA

## 2012-05-05 MED ORDER — DABIGATRAN ETEXILATE MESYLATE 150 MG PO CAPS: 150.0000 mg | ORAL_CAPSULE | Freq: Two times a day (BID) | ORAL | Status: DC

## 2012-05-05 MED ORDER — ZOLPIDEM TARTRATE 10 MG PO TABS: 10.0000 mg | ORAL_TABLET | Freq: Every evening | ORAL | Status: DC | PRN

## 2012-05-05 NOTE — Progress Notes (Signed)
  Subjective:    Patient ID: [name] [name], male    DOB: 1951/05/07, 61 y.o.   MRN: [id number]  HPI Patient comes in for followup after diagnosis new-onset diabetes to discuss anticoagulation.  In addition we checking right arm had some local reaction to the Zostavax but is doing much better.  He is just been started on metformin initially had some nausea and gas but is doing better taking it once a day with breakfast.  I had flexed Dr. Jens [name] in regard to his new diagnosis and he recommended stopping the aspirin and beginning projects to. [name] [name] has been on projects in the past as a transition related to his cardioversions. He has no past history of significant bleeding any recent past.  He has been on Coumadin in the past.  No history of renal failure or significant liver disease. He has had some mild changes in liver tests otherwise. Needs refill on his Ambien it didn't catch mailed in or faxed and he thinks.  Review of Systems No change see past note and above.    Objective:   Physical Exam Well-developed well-nourished in no acute distress examination of right upper arm shows faded redness rash without significant mass effect good range of motion. Skin no acute bruising or bleeding    Assessment & Plan:  New-onset diabetes history of recurrent atrial fibrillation and cardioversion treatments. Strain of coronary disease. Occasion to anticoagulation benefit more than risk discussed grouper thrombin inhibitors and other options for anticoagulation.  Risk of bleeding is no higher possibly less than Coumadin but if bleeding begins there is no reversal process but there is protocol.  Also should not be stopped suddenly as there is a slightly higher risk of a vascular event.  Samples and a handout given and co-pay card information. He will call and make an appointment with Dr. Jens [name] in 6-8 weeks and if  problems in the meantime.  D/c asa and add prdaxa and BMP and cbcdiff in 4  weeks and the OV with dr [name] [name] in 6-8 weeks. Ho and info an copay card and samples   Sleep med not there  Printed out today for patient   Dm  continue on the metformin followup is planned  Reaction to immuniz  Doing ok  no intervention.

## 2012-05-05 NOTE — Patient Instructions (Addendum)
Stop the asa and then begin pradaxa 150 twice a day.  Labs  Cbc diff   And bmp in 4 weeks and then  appt with Dr [name] in 6- 8 weeks   Continue with the metformin for now

## 2012-05-21 ENCOUNTER — Other Ambulatory Visit: Payer: Self-pay | Admitting: Urology

## 2012-05-21 ENCOUNTER — Telehealth: Payer: Self-pay | Admitting: Cardiology

## 2012-05-21 DIAGNOSIS — N281 Cyst of kidney, acquired: Secondary | ICD-10-CM

## 2012-05-21 NOTE — Telephone Encounter (Signed)
New msg Pt is having mri of abdomen. She wants to know if he has anything else done other that heart stents.

## 2012-05-21 NOTE — Telephone Encounter (Signed)
Spoke with theresa, nothing more than stents

## 2012-06-01 ENCOUNTER — Other Ambulatory Visit: Payer: Self-pay | Admitting: Internal Medicine

## 2012-06-01 DIAGNOSIS — E119 Type 2 diabetes mellitus without complications: Secondary | ICD-10-CM

## 2012-06-01 DIAGNOSIS — Z5181 Encounter for therapeutic drug level monitoring: Secondary | ICD-10-CM

## 2012-06-01 DIAGNOSIS — R7309 Other abnormal glucose: Secondary | ICD-10-CM

## 2012-06-02 ENCOUNTER — Other Ambulatory Visit (INDEPENDENT_AMBULATORY_CARE_PROVIDER_SITE_OTHER): Payer: 59

## 2012-06-02 DIAGNOSIS — E119 Type 2 diabetes mellitus without complications: Secondary | ICD-10-CM

## 2012-06-02 DIAGNOSIS — Z5181 Encounter for therapeutic drug level monitoring: Secondary | ICD-10-CM

## 2012-06-02 DIAGNOSIS — Z7901 Long term (current) use of anticoagulants: Secondary | ICD-10-CM

## 2012-06-02 DIAGNOSIS — R7309 Other abnormal glucose: Secondary | ICD-10-CM

## 2012-06-02 LAB — CBC WITH DIFFERENTIAL/PLATELET
Basophils Absolute: 0 10*3/uL (ref 0.0–0.1)
Basophils Relative: 0.3 % (ref 0.0–3.0)
Eosinophils Absolute: 0.5 10*3/uL (ref 0.0–0.7)
Eosinophils Relative: 6.1 % — ABNORMAL HIGH (ref 0.0–5.0)
HCT: 46.7 % (ref 39.0–52.0)
Hemoglobin: 15.8 g/dL (ref 13.0–17.0)
Lymphocytes Relative: 27.6 % (ref 12.0–46.0)
Lymphs Abs: 2.1 10*3/uL (ref 0.7–4.0)
MCHC: 33.9 g/dL (ref 30.0–36.0)
MCV: 93.2 fl (ref 78.0–100.0)
Monocytes Absolute: 0.6 10*3/uL (ref 0.1–1.0)
Monocytes Relative: 7.6 % (ref 3.0–12.0)
Neutro Abs: 4.4 10*3/uL (ref 1.4–7.7)
Neutrophils Relative %: 58.4 % (ref 43.0–77.0)
Platelets: 127 10*3/uL — ABNORMAL LOW (ref 150.0–400.0)
RBC: 5.01 Mil/uL (ref 4.22–5.81)
RDW: 13.3 % (ref 11.5–14.6)
WBC: 7.6 10*3/uL (ref 4.5–10.5)

## 2012-06-02 LAB — BASIC METABOLIC PANEL
BUN: 16 mg/dL (ref 6–23)
CO2: 25 mEq/L (ref 19–32)
Calcium: 9.2 mg/dL (ref 8.4–10.5)
Chloride: 107 mEq/L (ref 96–112)
Creatinine, Ser: 0.9 mg/dL (ref 0.4–1.5)
GFR: 91.89 mL/min (ref >60.00)
Glucose, Bld: 151 mg/dL — ABNORMAL HIGH (ref 70–99)
Potassium: 3.8 mEq/L (ref 3.5–5.1)
Sodium: 141 mEq/L (ref 135–145)

## 2012-06-09 ENCOUNTER — Encounter (HOSPITAL_COMMUNITY): Payer: Self-pay | Admitting: Emergency Medicine

## 2012-06-09 ENCOUNTER — Other Ambulatory Visit: Payer: Self-pay

## 2012-06-09 ENCOUNTER — Emergency Department (HOSPITAL_COMMUNITY)
Admission: EM | Admit: 2012-06-09 | Discharge: 2012-06-09 | Disposition: A | Discharge status: Home / Self Care | Payer: 59 | Attending: Emergency Medicine | Admitting: Emergency Medicine

## 2012-06-09 DIAGNOSIS — Z87891 Personal history of nicotine dependence: Secondary | ICD-10-CM | POA: Insufficient documentation

## 2012-06-09 DIAGNOSIS — I4891 Unspecified atrial fibrillation: Secondary | ICD-10-CM

## 2012-06-09 DIAGNOSIS — Z79899 Other long term (current) drug therapy: Secondary | ICD-10-CM | POA: Insufficient documentation

## 2012-06-09 DIAGNOSIS — I252 Old myocardial infarction: Secondary | ICD-10-CM | POA: Insufficient documentation

## 2012-06-09 DIAGNOSIS — G4733 Obstructive sleep apnea (adult) (pediatric): Secondary | ICD-10-CM | POA: Insufficient documentation

## 2012-06-09 DIAGNOSIS — K219 Gastro-esophageal reflux disease without esophagitis: Secondary | ICD-10-CM | POA: Insufficient documentation

## 2012-06-09 DIAGNOSIS — E785 Hyperlipidemia, unspecified: Secondary | ICD-10-CM | POA: Insufficient documentation

## 2012-06-09 LAB — COMPREHENSIVE METABOLIC PANEL
ALT: 63 U/L — ABNORMAL HIGH (ref 0–53)
AST: 32 U/L (ref 0–37)
Albumin: 4.2 g/dL (ref 3.5–5.2)
Alkaline Phosphatase: 68 U/L (ref 39–117)
BUN: 12 mg/dL (ref 6–23)
CO2: 23 mEq/L (ref 19–32)
Calcium: 9.7 mg/dL (ref 8.4–10.5)
Chloride: 105 mEq/L (ref 96–112)
Creatinine, Ser: 0.72 mg/dL (ref 0.50–1.35)
GFR calc Af Amer: >90 mL/min (ref >90)
GFR calc non Af Amer: >90 mL/min (ref >90)
Glucose, Bld: 145 mg/dL — ABNORMAL HIGH (ref 70–99)
Potassium: 4.2 mEq/L (ref 3.5–5.1)
Sodium: 139 mEq/L (ref 135–145)
Total Bilirubin: 0.8 mg/dL (ref 0.3–1.2)
Total Protein: 7.3 g/dL (ref 6.0–8.3)

## 2012-06-09 LAB — CBC
HCT: 48.8 % (ref 39.0–52.0)
Hemoglobin: 17.5 g/dL — ABNORMAL HIGH (ref 13.0–17.0)
MCH: 31.8 pg (ref 26.0–34.0)
MCHC: 35.9 g/dL (ref 30.0–36.0)
MCV: 88.7 fL (ref 78.0–100.0)
Platelets: 140 10*3/uL — ABNORMAL LOW (ref 150–400)
RBC: 5.5 MIL/uL (ref 4.22–5.81)
RDW: 13 % (ref 11.5–15.5)
WBC: 8.6 10*3/uL (ref 4.0–10.5)

## 2012-06-09 LAB — DIFFERENTIAL
Basophils Absolute: 0 10*3/uL (ref 0.0–0.1)
Basophils Relative: 0 % (ref 0–1)
Eosinophils Absolute: 0.6 10*3/uL (ref 0.0–0.7)
Eosinophils Relative: 7 % — ABNORMAL HIGH (ref 0–5)
Lymphocytes Relative: 28 % (ref 12–46)
Lymphs Abs: 2.4 10*3/uL (ref 0.7–4.0)
Monocytes Absolute: 0.8 10*3/uL (ref 0.1–1.0)
Monocytes Relative: 9 % (ref 3–12)
Neutro Abs: 4.8 10*3/uL (ref 1.7–7.7)
Neutrophils Relative %: 56 % (ref 43–77)

## 2012-06-09 LAB — MAGNESIUM: Magnesium: 2 mg/dL (ref 1.5–2.5)

## 2012-06-09 MED ORDER — SODIUM CHLORIDE 0.9 % IV BOLUS (SEPSIS)
1000.0000 mL | Freq: Once | INTRAVENOUS | Status: AC
  Administered 2012-06-09: 1000 mL via INTRAVENOUS

## 2012-06-09 MED ORDER — PROPOFOL 10 MG/ML IV BOLUS
200.0000 mg | Freq: Once | INTRAVENOUS | Status: AC
  Administered 2012-06-09: 200 mg via INTRAVENOUS

## 2012-06-09 MED ORDER — PROPOFOL 10 MG/ML IV BOLUS
INTRAVENOUS | Status: AC | PRN
  Administered 2012-06-09: 100 mg via INTRAVENOUS
  Administered 2012-06-09: 25 mg via INTRAVENOUS
  Administered 2012-06-09: 75 mg via INTRAVENOUS

## 2012-06-09 NOTE — ED Provider Notes (Signed)
History     CSN: [id number]  Arrival date & time 06/09/12  0840   First MD Initiated Contact with Patient 06/09/12 [phone]      Chief Complaint  Patient presents with  . Tachycardia  . Palpitations     Patient is a 61 y.o. male presenting with palpitations. The history is provided by the patient.  Palpitations  This is a recurrent problem. The current episode started 12 to 24 hours ago (starting at 19:00 last night; 14 hrs ago). The problem occurs constantly. The problem has not changed since onset.Associated with: nothing; was eating when palpitations started. On average, each episode lasts 14 hours. Pertinent negatives include no diaphoresis, no fever, no chest pain, no abdominal pain, no nausea, no vomiting, no cough and no shortness of breath. He has tried nothing for the symptoms. Risk factors: hx of Atrial Fibrillation; on Tikosyn and Pradaxa. Past medical history comments: hx of Atrial Fibrillation.    Past Medical History  Diagnosis Date  . HYPERLIPIDEMIA 03/22/2009  . Obesity, unspecified 04/24/2009  . THROMBOCYTOPENIA 08/16/2010  . ERECTILE DYSFUNCTION 03/22/2009  . SLEEP APNEA, OBSTRUCTIVE 03/22/2009    compliant with CPAP  . 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 Coronary atherosclerosis of native coronary artery 11/2002    a. s/p stent to LAD 12/03; OM2 occluded at cath 12/03; d. myoview 5/10: no ischemia;  e. echo 7/11: EF 55%, BAE, mild RVE, PASP 41-45  . Atrial fibrillation 03/22/2009    a. s/p multiple DCCV; b. no coumadin due to low TE risk profile; c. Tikosyn Rx  . GERD 03/22/2009  . LATERAL EPICONDYLITIS, LEFT 10/24/2009  . HYPERGLYCEMIA 04/25/2010  . LIVER FUNCTION TESTS, ABNORMAL 04/25/2010  . TOBACCO USE, QUIT 10/24/2009  . Umbilical hernia   . Myocardial infarction mi2003    Past Surgical History  Procedure Date  . Cyst on epiglottis 08/2002  . Surgery l4-l5  1998    ruptured x 3  . Stent     LAD DUS 2004  . Ulnar neuropathy   . Umbilical hernia repair   .  Lumbar disc surgery     two  holes in spinalcovering  . Esophagogastroduodenoscopy 11/15/2011    Procedure: ESOPHAGOGASTRODUODENOSCOPY (EGD);  Surgeon: Charna Elizabeth, MD;  Location: [facility];  Service: Endoscopy;  Laterality: N/A;  . Colonoscopy 11/15/2011    Procedure: COLONOSCOPY;  Surgeon: Charna Elizabeth, MD;  Location: [facility];  Service: Endoscopy;  Laterality: N/A;    Family History  Problem Relation Age of Onset  . Thyroid disease Mother   . Ovarian cancer Mother   . Breast cancer Mother   . Lung cancer Father   . Cancer Father     History  Substance Use Topics  . Smoking status: Former Smoker -- 2.0 packs/day for 42 years    Types: Cigarettes    Quit date: 12/30/2008  . Smokeless tobacco: Never Used   Comment: started at age 48; 1-2 ppd; quit 2010  . Alcohol Use: Yes     socially      Review of Systems  Constitutional: Negative for fever, chills and diaphoresis.  HENT: Negative for neck pain.   Respiratory: Negative for cough, chest tightness, shortness of breath and wheezing.   Cardiovascular: Positive for palpitations. Negative for chest pain and leg swelling.  Gastrointestinal: Negative for nausea, vomiting, abdominal pain, diarrhea and constipation.  Skin: Negative for rash and wound.  All other systems reviewed and are negative.    Allergies  Penicillins; Procaine hcl; Pseudoephedrine; and  Sulfonamide derivatives  Home Medications   Current Outpatient Rx  Name Route Sig Dispense Refill  . ATORVASTATIN CALCIUM 80 MG PO TABS Oral Take 1 tablet (80 mg total) by mouth daily. 90 tablet 3  . B COMPLEX PO TABS Oral Take 1 tablet by mouth daily.    . B COMPLEX PO TABS Oral Take 1 tablet by mouth daily.    [name] DABIGATRAN ETEXILATE MESYLATE 150 MG PO CAPS Oral Take 1 capsule (150 mg total) by mouth every 12 (twelve) hours. 60 capsule 3  . DOFETILIDE 500 MCG PO CAPS Oral Take 1 capsule (500 mcg total) by mouth 2 (two) times daily. 60 capsule 6  . OMEGA-3 FATTY  ACIDS 1000 MG PO CAPS Oral Take 2 g by mouth daily.      [name] MAGNESIUM GLUCONATE 500 MG PO TABS Oral Take 500 mg by mouth daily.     [name] METFORMIN HCL ER 500 MG PO TB24 Oral Take 1 tablet (500 mg total) by mouth daily with breakfast. 90 tablet 3  . MULTIPLE VITAMIN PO Oral Take 1 tablet by mouth daily.     [name] NITROGLYCERIN 0.4 MG SL SUBL Sublingual Place 1 tablet (0.4 mg total) under the tongue every 5 (five) minutes as needed for chest pain. 25 tablet 3  . PANTOPRAZOLE SODIUM 40 MG PO TBEC Oral Take 1 tablet (40 mg total) by mouth daily. 90 tablet 3  . POTASSIUM CHLORIDE CRYS ER 20 MEQ PO TBCR Oral Take 1 tablet (20 mEq total) by mouth daily. 30 tablet 5  . ZOLPIDEM TARTRATE 10 MG PO TABS Oral Take 1 tablet (10 mg total) by mouth at bedtime as needed. 90 tablet 1    BP 114/92  Pulse 116  Temp(Src) 98.4 F (36.9 C) (Oral)  Resp 21  SpO2 96%  Physical Exam  Nursing note and vitals reviewed. Constitutional: He appears well-developed and well-nourished.  HENT:  Head: Normocephalic and atraumatic.  Right Ear: External ear normal.  Left Ear: External ear normal.  Nose: Nose normal.  Mouth/Throat: Oropharynx is clear and moist. No oropharyngeal exudate.  Eyes: Conjunctivae are normal. Pupils are equal, round, and reactive to light.  Cardiovascular: Normal heart sounds and intact distal pulses.        Irregularly irregular rhythm Majority of time tachycardic rate  Pulmonary/Chest: Effort normal and breath sounds normal. No respiratory distress. He has no wheezes. He has no rales. He exhibits no tenderness.  Abdominal: Soft. Bowel sounds are normal. He exhibits no distension and no mass. There is no tenderness. There is no rebound and no guarding.  Musculoskeletal: Normal range of motion. He exhibits no edema and no tenderness.  Neurological: He is alert. He displays normal reflexes. No cranial nerve deficit. He exhibits normal muscle tone. Coordination normal.  Skin: Skin is warm and dry. No  rash noted. No erythema. No pallor.  Psychiatric: He has a normal mood and affect. His behavior is normal. Judgment and thought content normal.    ED Course  Procedural sedation Date/Time: 06/09/2012 12:04 PM Performed by: Clemetine Marker Authorized by: Linwood Dibbles R Consent: Verbal consent obtained. Written consent obtained. Risks and benefits: risks, benefits and alternatives were discussed Consent given by: patient and spouse Patient understanding: patient states understanding of the procedure being performed Patient consent: the patient's understanding of the procedure matches consent given Procedure consent: procedure consent matches procedure scheduled Relevant documents: relevant documents present and verified Test results: test results available and properly labeled Site marked: the operative  site was marked Imaging studies: imaging studies available Patient identity confirmed: verbally with patient Time out: Immediately prior to procedure a "time out" was called to verify the correct patient, procedure, equipment, support staff and site/side marked as required. Patient tolerance: Patient tolerated the procedure well with no immediate complications. Comments: Patient given 200 mg of Propofol total and achieved adequate sedation   (including critical care time)  Labs Reviewed - No data to display No results found.   1. Atrial fibrillation      Date: 06/09/2012  Rate: 97  Rhythm: atrial fibrillation  QRS Axis: normal  Intervals: No PR interval (A Fib); normal QTc  ST/T Wave abnormalities: normal  Conduction Disutrbances:none  Narrative Interpretation: Atrial Fibrillation; new in onset since previous EKG  Old EKG Reviewed: changes noted; now in atrial fibrillation    MDM  61 yo F w/hx of paroxysmal atrial fibrillation (on Pradaxa for anti-coagulation and Tikosyn for rhythm control) presents for palpitations and sensation of irregular heartbeat since 19:00 last night.  Telemetry, EKG, and exam c/w atrial fibrillation. Pt in RVR majority of time. Cardiology consulted and pt cardioverted (200J) under sedation (procedural sedation: 200mg  Propofol given) see procedure note; pt tolerated procedure well without complication. After one hour of observation, pt remained in NSR. Pt to f/u with Cardiology in 7 days regarding A. Fib.         Clemetine Marker, MD 06/09/12 [phone]

## 2012-06-09 NOTE — ED Notes (Signed)
RN at the bedside 

## 2012-06-09 NOTE — ED Notes (Signed)
Pt in AFIB rate of 95. Pt reports hx of such, cardioverted several time for this. Reports last night was watching TV and felt heart go into AFIB. Reports "palpations". Denying any cp or sob. Pt goo historian, prior EMS experience.

## 2012-06-09 NOTE — ED Notes (Signed)
Pt oxygen sats dropped to 85% on 4 L, pt placed on NRB.

## 2012-06-09 NOTE — ED Notes (Signed)
Pt shocked at 200 joules 1239. Pt in SR HR 58.

## 2012-06-09 NOTE — ED Notes (Signed)
Pt maintaining steward scale of 6, fully alert. Pt transitioned to 2 L oxygen 99%

## 2012-06-09 NOTE — CV Procedure (Signed)
CARDIOVERSION:  Indication: atrial fibrillation, symptomatic  Using propofol for sedation (administered by ER physician), the patient was successfully cardioverted from atrial fib to normal sinus rhythm with a single, synchronized shock of 200 Joules (biphasic). He will be observed for one hour then discharged home.  [name] [name] 06/09/2012 3:10 PM

## 2012-06-09 NOTE — ED Notes (Signed)
PT maintaining HR 50 SB. 100% on NRB. Pt 6 on Steward Sedation. Will continue to monitor pt.

## 2012-06-09 NOTE — ED Provider Notes (Addendum)
Pt with recurrent a fib.  No distress at this time.  No CP or shortness of breath.  Pt states previously he has always been cardioverted.  Filed Vitals:   06/09/12 0935  BP: 118/63  Pulse: 94  Temp:   Resp: 16   On exam Car irreg irreg.  Lungs CTA. Will consult cardiology to see if they would like to cardiovert again.   Rate in the low 100s here.  No distress.  I saw and evaluated the patient, reviewed the resident's note and I agree with the findings and plan. I have reviewed the EKG and the interpretation and agree.  Celene Kras, MD 06/09/12 [phone]  I was present for the entire period of conscious sedation.  Pt tolerated the procedure well except one episode of hypoxia into the 80s which we treated with bag valve mask.  Pt is alert and oriented now.  Back in NSR.  Celene Kras, MD 06/09/12 [phone]

## 2012-06-09 NOTE — Discharge Instructions (Signed)
Atrial Fibrillation Your caregiver has diagnosed you with atrial fibrillation (AFib). The heart normally beats very regularly; AFib is a type of irregular heartbeat. The heart rate may be faster or slower than normal. This can prevent your heart from pumping as well as it should. AFib can be constant (chronic) or intermittent (paroxysmal). CAUSES  Atrial fibrillation may be caused by:  Heart disease, including heart attack, coronary artery disease, heart failure, diseases of the heart valves, and others.   Blood clot in the lungs (pulmonary embolism).   Pneumonia or other infections.   Chronic lung disease.   Thyroid disease.   Toxins. These include alcohol, some medications (such as decongestant medications or diet pills), and caffeine.  In some people, no cause for AFib can be found. This is referred to as Lone Atrial Fibrillation. SYMPTOMS   Palpitations or a fluttering in your chest.   A vague sense of chest discomfort.   Shortness of breath.   Sudden onset of lightheadedness or weakness.  Sometimes, the first sign of AFib can be a complication of the condition. This could be a stroke or heart failure. DIAGNOSIS  Your description of your condition may make your caregiver suspicious of atrial fibrillation. Your caregiver will examine your pulse to determine if fibrillation is present. An EKG (electrocardiogram) will confirm the diagnosis. Further testing may help determine what caused you to have atrial fibrillation. This may include chest x-ray, echocardiogram, blood tests, or CT scans. PREVENTION  If you have previously had atrial fibrillation, your caregiver may advise you to avoid substances known to cause the condition (such as stimulant medications, and possibly caffeine or alcohol). You may be advised to use medications to prevent recurrence. Proper treatment of any underlying condition is important to help prevent recurrence. PROGNOSIS  Atrial fibrillation does tend to  become a chronic condition over time. It can cause significant complications (see below). Atrial fibrillation is not usually immediately life-threatening, but it can shorten your life expectancy. This seems to be worse in women. If you have lone atrial fibrillation and are under 60 years old, the risk of complications is very low, and life expectancy is not shortened. RISKS AND COMPLICATIONS  Complications of atrial fibrillation can include stroke, chest pain, and heart failure. Your caregiver will recommend treatments for the atrial fibrillation, as well as for any underlying conditions, to help minimize risk of complications. TREATMENT  Treatment for AFib is divided into several categories:  Treatment of any underlying condition.   Converting you out of AFib into a regular (sinus) rhythm.   Controlling rapid heart rate.   Prevention of blood clots and stroke.  Medications and procedures are available to convert your atrial fibrillation to sinus rhythm. However, recent studies have shown that this may not offer you any advantage, and cardiac experts are continuing research and debate on this topic. More important is controlling your rapid heartbeat. The rapid heartbeat causes more symptoms, and places strain on your heart. Your caregiver will advise you on the use of medications that can control your heart rate. Atrial fibrillation is a strong stroke risk. You can lessen this risk by taking blood thinning medications such as Coumadin (warfarin), or sometimes aspirin. These medications need close monitoring by your caregiver. Over-medication can cause bleeding. Too little medication may not protect against stroke. HOME CARE INSTRUCTIONS   If your caregiver prescribed medicine to make your heartbeat more normally, take as directed.   If blood thinners were prescribed by your caregiver, take EXACTLY as directed.     Perform blood tests EXACTLY as directed.   Quit smoking. Smoking increases your  cardiac and lung (pulmonary) risks.   DO NOT drink alcohol.   DO NOT drink caffeinated drinks (e.g. coffee, soda, chocolate, and leaf teas). You may drink decaffeinated coffee, soda or tea.   If you are overweight, you should choose a reduced calorie diet to lose weight. Please see a registered dietitian if you need more information about healthy weight loss. DO NOT USE DIET PILLS as they may aggravate heart problems.   If you have other heart problems that are causing AFib, you may need to eat a low salt, fat, and cholesterol diet. Your caregiver will tell you if this is necessary.   Exercise every day to improve your physical fitness. Stay active unless advised otherwise.   If your caregiver has given you a follow-up appointment, it is very important to keep that appointment. Not keeping the appointment could result in heart failure or stroke. If there is any problem keeping the appointment, you must call back to this facility for assistance.  SEEK MEDICAL CARE IF:  You notice a change in the rate, rhythm or strength of your heartbeat.   You develop an infection or any other change in your overall health status.  SEEK IMMEDIATE MEDICAL CARE IF:   You develop chest pain, abdominal pain, sweating, weakness or feel sick to your stomach (nausea).   You develop shortness of breath.   You develop swollen feet and ankles.   You develop dizziness, numbness, or weakness of your face or limbs, or any change in vision or speech.  MAKE SURE YOU:   Understand these instructions.   Will watch your condition.   Will get help right away if you are not doing well or get worse.  Document Released: 12/16/2005 Document Revised: 12/05/2011 Document Reviewed: 07/20/2008 [facility] Patient Information 2012 [place], [place].Electrical Cardioversion Cardioversion is the delivery of a jolt of electricity to change the rhythm of the heart. Sticky patches or metal paddles are placed on the chest to deliver  the electricity from a special device. This is done to restore a normal rhythm. A rhythm that is too fast or not regular keeps the heart from pumping well. Compared to medicines used to change an abnormal rhythm, cardioversion is faster and works better. It is also unpleasant and may dislodge blood clots from the heart. WHEN WOULD THIS BE DONE?  In an emergency:   There is low or no blood pressure as a result of the heart rhythm.   Normal rhythm must be restored as fast as possible to protect the brain and heart from further damage.   It may save a life.   For less serious heart rhythms, such as atrial fibrillation or flutter, in which:   The heart is beating too fast or is not regular.   The heart is still able to pump enough blood, but not as well as it should.   Medicine to change the rhythm has not worked.   It is safe to wait in order to allow time for preparation.  LET YOUR CAREGIVER KNOW ABOUT:   Every medicine you are taking. It is very important to do this! Know when to take or stop taking any of them.   Any time in the past that you have felt your heart was not beating normally.  RISKS AND COMPLICATIONS   Clots may form in the chambers of the heart if it is beating too fast. These clots may  be dislodged during the procedure and travel to other parts of the body.   There is risk of a stroke during and after the procedure if a clot moves. Blood thinners lower this risk.   You may have a special test of your heart (TEE) to make sure there are no clots in your heart.  BEFORE THE PROCEDURE   You may have some tests to see how well your heart is working.   You may start taking blood thinners so your blood does not clot as easily.   Other drugs may be given to help your heart work better.  PROCEDURE (SCHEDULED)  The procedure is typically done in a hospital by a heart doctor (cardiologist).   You will be told when and where to go.   You may be given some medicine  through an intravenous (IV) access to reduce discomfort and make you sleepy before the procedure.   Your whole body may move when the shock is delivered. Your chest may feel sore.   You may be able to go home after a few hours. Your heart rhythm will be watched to make sure it does not change.  HOME CARE INSTRUCTIONS   Only take medicine as directed by your caregiver. Be sure you understand how and when to take your medicine.   Learn how to feel your pulse and check it often.   Limit your activity for 48 hours.   Avoid caffeine and other stimulants as directed.  SEEK MEDICAL CARE IF:   You feel like your heart is beating too fast or your pulse is not regular.   You have any questions about your medicines.   You have bleeding that will not stop.  SEEK IMMEDIATE MEDICAL CARE IF:   You are dizzy or feel faint.   It is hard to breathe or you feel short of breath.   There is a change in discomfort in your chest.   Your speech is slurred or you have trouble moving your arm or leg on one side.   You get a muscle cramp.   Your fingers or toes turn cold or blue.  MAKE SURE YOU:   Understand these instructions.   Will watch your condition.   Will get help right away if you are not doing well or get worse.  Document Released: 12/06/2002 Document Revised: 12/05/2011 Document Reviewed: 04/06/2008 [facility] Patient Information 2012 [place], [place].

## 2012-06-09 NOTE — ED Notes (Signed)
Consents obtained. Pt a x 4. HR 91 AFIB

## 2012-06-09 NOTE — Consult Note (Signed)
Cardiology Consult Note   [name] ID: [name] [name] MRN: [id number], DOB/AGE: 1951/10/19   Admit date: 06/09/2012 Date of Consult: 06/09/2012  Primary Physician: Lorretta Harp, MD, MD Primary Cardiologist: Olga Millers, MD  Pt. Profile: [name] [name] is a 61yo male with PMHx significant for CAD (s/p unspecified LAD stenting 2003), paryoxysmal atrial fibrillation (Tikosyn, chronic Pradaxa anticoagulation; multiple prior DCCVs), type 2 DM, HL, GERD, OSA and obesity who presents to [facility] ED today with c/o palpitations.   Reason for consult: evaluation/management of atrial fibrillation with RVR  Problem List: Past Medical History  Diagnosis Date  . HYPERLIPIDEMIA 03/22/2009  . Obesity, unspecified 04/24/2009  . THROMBOCYTOPENIA 08/16/2010  . ERECTILE DYSFUNCTION 03/22/2009  . SLEEP APNEA, OBSTRUCTIVE 03/22/2009    compliant with CPAP  . 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 Coronary atherosclerosis of native coronary artery 11/2002    a. s/p stent to LAD 12/03; OM2 occluded at cath 12/03; d. myoview 5/10: no ischemia;  e. echo 7/11: EF 55%, BAE, mild RVE, PASP 41-45  . Atrial fibrillation 03/22/2009    a. s/p multiple DCCV; b. no coumadin due to low TE risk profile; c. Tikosyn Rx  . GERD 03/22/2009  . LATERAL EPICONDYLITIS, LEFT 10/24/2009  . HYPERGLYCEMIA 04/25/2010  . LIVER FUNCTION TESTS, ABNORMAL 04/25/2010  . TOBACCO USE, QUIT 10/24/2009  . Umbilical hernia   . Myocardial infarction mi2003    Past Surgical History  Procedure Date  . Cyst on epiglottis 08/2002  . Surgery l4-l5  1998    ruptured x 3  . Stent     LAD DUS 2004  . Ulnar neuropathy   . Umbilical hernia repair   . Lumbar disc surgery     two  holes in spinalcovering  . Esophagogastroduodenoscopy 11/15/2011    Procedure: ESOPHAGOGASTRODUODENOSCOPY (EGD);  Surgeon: Charna Elizabeth, MD;  Location: [facility];  Service: Endoscopy;  Laterality: N/A;  . Colonoscopy 11/15/2011    Procedure: COLONOSCOPY;  Surgeon: Charna Elizabeth, MD;  Location: [facility];  Service: Endoscopy;  Laterality: N/A;     Allergies:  Allergies  Allergen Reactions  . Penicillins   . Procaine Hcl     REACTION: Reaction not known  . Pseudoephedrine Other (See Comments)    [name] went into afib  . Sulfonamide Derivatives     HPI:   [name] [name] followed up with Dr. [name] in 04/13. Prior Myoview in 03/13 revealed no evidence of ischemia or infarction; EF 51%. Last echo in 07/11 revealed normal LV function, mild biatrial enlargement and mild RV dilatation and mild pHTN. He has been on Tikosyn for atrial fibrillation with recurrent episodes in 05/12 and 06/12. Per follow-up with Dr. Johney Frame, ablation was discussed and would be considered only if [name] lost weight. He recently required DCCV in 03/13 for recurrent atrial fibrillation. He was asymptomatic on follow-up.   He usually can tell exactly when he is in a fib. He reports experiencing palpitations 7:15 PM after eating. He reported lightheadedness on ambulating as well. Denies SOB, DOE, presyncope, chest pain, n/v/d, fevers, chills or cough. No active bleeding. Medication compliance. No caffeine, no alcohol use. No OTC supplements. Wears CPAP machine regularly. Little exercise. Diet consists of modified Atkins diet (low-carb).   Upon ED arrival, [name] was noted to be in atrial fibrillation with rates varying from 80-130. VSS. BMET and CBC WNL. TSH 1.21 in 04/13.   Home Medications: Prior to Admission medications   Medication Sig Start Date End Date Taking? Authorizing Provider  atorvastatin (  LIPITOR) 80 MG tablet Take 1 tablet (80 mg total) by mouth daily. 04/29/12  Yes Madelin Headings, MD  b complex vitamins tablet Take 1 tablet by mouth daily.   Yes Historical Provider, MD  b complex vitamins tablet Take 1 tablet by mouth daily.   Yes Historical Provider, MD  dabigatran (PRADAXA) 150 MG CAPS Take 1 capsule (150 mg total) by mouth every 12 (twelve) hours. 05/05/12  Yes Madelin Headings, MD  dofetilide (TIKOSYN) 500 MCG capsule Take 1 capsule (500 mcg total) by mouth 2 (two) times daily. 01/23/12  Yes Lewayne Bunting, MD  fish oil-omega-3 fatty acids 1000 MG capsule Take 2 g by mouth daily.     Yes Historical Provider, MD  magnesium gluconate (MAGONATE) 500 MG tablet Take 500 mg by mouth daily.    Yes Historical Provider, MD  metFORMIN (GLUCOPHAGE-XR) 500 MG 24 hr tablet Take 1 tablet (500 mg total) by mouth daily with breakfast. 04/29/12 04/29/13 Yes Madelin Headings, MD  MULTIPLE VITAMIN PO Take 1 tablet by mouth daily.    Yes Historical Provider, MD  nitroGLYCERIN (NITROSTAT) 0.4 MG SL tablet Place 1 tablet (0.4 mg total) under [name] tongue every 5 (five) minutes as needed for chest pain. 09/09/11 09/08/12 Yes Lewayne Bunting, MD  pantoprazole (PROTONIX) 40 MG tablet Take 1 tablet (40 mg total) by mouth daily. 04/29/12  Yes Madelin Headings, MD  potassium chloride SA (KLOR-CON M20) 20 MEQ tablet Take 1 tablet (20 mEq total) by mouth daily. 01/23/12  Yes Lewayne Bunting, MD  zolpidem (AMBIEN) 10 MG tablet Take 1 tablet (10 mg total) by mouth at bedtime as needed. 05/05/12  Yes Madelin Headings, MD    Inpatient Medications:     . propofol  200 mg Intravenous Once    (Not in a hospital admission)  Family History  Problem Relation Age of Onset  . Thyroid disease Mother   . Ovarian cancer Mother   . Breast cancer Mother   . Lung cancer Father   . Cancer Father      History   Social History  . Marital Status: Married    Spouse Name: N/A    Number of Children: N/A  . Years of Education: N/A   Occupational History  . Not on file.   Social History Main Topics  . Smoking status: Former Smoker -- 2.0 packs/day for 42 years    Types: Cigarettes    Quit date: 12/30/2008  . Smokeless tobacco: Never Used   Comment: started at age 55; 1-2 ppd; quit 2010  . Alcohol Use: No  . Drug Use: No  . Sexually Active: Yes   Other Topics Concern  . 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 Back surgery Has older dogRetired from stat 31 years of service now 7 years      Review of Systems: General: negative for chills, fever, night sweats or weight changes.  Cardiovascular: positive for palpitations and lightheadedness, negative for chest pain, dyspnea on exertion, edema, orthopnea, paroxysmal nocturnal dyspnea or shortness of breath Dermatological: negative for rash Respiratory: negative for cough or wheezing Urologic: negative for hematuria Abdominal: negative for nausea, vomiting, diarrhea, bright red blood per rectum, melena, or hematemesis Neurologic: negative for visual changes, syncope, or dizziness All other systems reviewed and are otherwise negative except as noted above.  Physical Exam: Blood pressure 101/50, pulse 99, temperature 98.4 F (36.9 C), temperature source Oral, resp. rate 16, SpO2 96.00%.    General: Obese, well developed, in no acute distress. Head: Normocephalic, atraumatic, sclera non-icteric, no xanthomas, nares are without discharge.  Neck: Negative for carotid bruits. JVD not elevated. Lungs: Clear bilaterally to auscultation without wheezes, rales, or rhonchi. Breathing is unlabored. Heart: Irregularly irregular, with S1 S2. No murmurs, rubs, or gallops appreciated. Abdomen: Soft, non-tender, non-distended with normoactive bowel sounds. No hepatomegaly. No rebound/guarding. No obvious abdominal masses. Msk:  Strength and tone appears normal for age. Extremities: No clubbing, cyanosis or edema.  Distal pedal pulses are 2+ and equal bilaterally. Neuro: Alert and oriented X 3. Moves all extremities spontaneously. Psych:  Responds to questions appropriately with a normal affect.  Labs: Recent Labs  Basename 06/09/12 0900   WBC 8.6   HGB 17.5*   HCT 48.8   MCV 88.7   PLT 140*     Lab 06/09/12 0900  NA 139  K 4.2  CL 105  CO2 23  BUN 12  CREATININE 0.72  CALCIUM 9.7  PROT 7.3  BILITOT 0.8  ALKPHOS 68  ALT 63*  AST 32  AMYLASE --  LIPASE --  GLUCOSE 145*    EKG: atrial fibrillation, 97 bpm, no ST-T wave changes  ASSESSMENT AND PLAN:   [name] [name] is a 61yo male with PMHx significant for CAD (s/p unspecified LAD stenting 2003), paryoxysmal atrial fibrillation (Tikosyn, chronic Pradaxa anticoagulation; multiple prior DCCVs), type 2 DM, HL, GERD, OSA and obesity who presents to [facility] ED today with c/o palpitations.   1. Paroxysmal atrial fibrillation- with variable ventricular response. On interviewing, rate fluctuated between 80-100 bpm. Noted to be as high as 130 bpm. [name] is symptomatic while in atrial fibrillation endorsing palpitations and lightheadedness. CBC and BMET unremarkable, EKG without ischemic changes. Recent normal Myoview as above. This is likely secondary to OSA and progressive structural cardiac sequelae as noted on prior echo (biatrial enlargement, RV dilatation). He has required multiple (up to six) cardioversions in [name] past. Discussed long term keys to management including diet & exercise for weight reduction which is largely driving his sleep apnea. Management of chronic medical conditions and regular blood work (TSH, etc.) followed by PCP. Encouraged to abstain from potential exacerbating factors (caffeine, alcohol, OTC/dietary supplements, pseudoephedrine, albuterol only PRN). In regard to short-term management, he is agreeable to DCCV today. He has not missed Pradaxa doses over [name] past 3-4 weeks. Will plan for DCCV in [name] ED today. Discharge afterwards if successful conversion to SR.   - Continue Tikosyn and Pradaxa outpatient regimens   Signed, R. Hurman Horn, PA-C 06/09/2012, 12:03 PM  [name] seen, examined. Available data reviewed. Agree with findings, assessment, and plan as outlined by Hurman Horn, PA-C. Pt with known afib  who has required multiple DCCV's presents with recurrent AF. He has been anticoagulated with Pradaxa and is confident that he has not missed any doses in [name] past 4 weeks. He has seen Dr Johney Frame in [name] past and weight loss was recommended to better control sleep apnea before attempting AFib ablation. After review of all data, including EKG from today, past echo data, and outpatient encounters, I feel DCCV is best option here. [name] [name] was consented for DCCV after explanation of risks and alternatives. If he cardioverts successfully, will d/c from [name] ER and arrange close outpatient follow-up.  Tonny Bollman, M.D. 06/09/2012 3:04 PM

## 2012-06-09 NOTE — ED Notes (Signed)
Pt c/o palpitations and afib starting last night at 1900; pt sts hx of same and has been cardioverted 6 times; pt sts some exertional SOB but denies CP

## 2012-06-12 ENCOUNTER — Ambulatory Visit
Admission: RE | Admit: 2012-06-12 | Discharge: 2012-06-12 | Disposition: A | Discharge status: Home / Self Care | Payer: 59 | Source: Ambulatory Visit | Attending: Urology | Admitting: Urology

## 2012-06-12 DIAGNOSIS — N281 Cyst of kidney, acquired: Secondary | ICD-10-CM

## 2012-06-12 MED ORDER — GADOBENATE DIMEGLUMINE 529 MG/ML IV SOLN
20.0000 mL | Freq: Once | INTRAVENOUS | Status: AC | PRN
  Administered 2012-06-12: 20 mL via INTRAVENOUS

## 2012-06-15 ENCOUNTER — Other Ambulatory Visit: Payer: 59

## 2012-06-16 ENCOUNTER — Encounter: Payer: Self-pay | Admitting: Physician Assistant

## 2012-06-16 ENCOUNTER — Ambulatory Visit (INDEPENDENT_AMBULATORY_CARE_PROVIDER_SITE_OTHER): Payer: 59 | Admitting: Physician Assistant

## 2012-06-16 VITALS — BP 133/61 | HR 67 | Ht 74.0 in | Wt 336.0 lb

## 2012-06-16 DIAGNOSIS — I4891 Unspecified atrial fibrillation: Secondary | ICD-10-CM

## 2012-06-16 DIAGNOSIS — I2510 Atherosclerotic heart disease of native coronary artery without angina pectoris: Secondary | ICD-10-CM

## 2012-06-16 DIAGNOSIS — E669 Obesity, unspecified: Secondary | ICD-10-CM

## 2012-06-16 NOTE — Progress Notes (Signed)
[address] [place], [place]  [address] Phone: [phone] Fax:  [phone]  Date:  06/16/2012   Name:  [name] [name]   DOB:  1951/01/30   MRN:  [id number]  PCP:  Lorretta Harp, MD  Primary Cardiologist:  Dr. Olga Millers  Primary Electrophysiologist:  None    History of Present Illness: [name] [name] is a 61 y.o. male who returns for post hospital follow up.  He has a history of coronary artery disease status post stent to his LAD in December 2003 as well as paroxysmal atrial fibrillation. Last Myoview was in March 2013. There was no ischemia or infarction. The calculated ejection fraction was 51%. Admitted with frequent PVCs in July 2011. Noted to be bradycardic and beta blocker changed to p.r.n. TSH and enzymes were normal. Last echocardiogram in July 2011 showed normal LV function, mild biatrial enlargement and mild right ventricular enlargement. Patient on Tikosyn for afib. Recurrent episode in May and June of 2012. Saw Dr. Johney Frame in F/U and AFib ablation could be considered if patient lost weight.    He presented to the emergency room 06/09/12 with palpitations.  He felt he was back in atrial fibrillation.  EKG demonstrated atrial fibrillation with a rate of 97.  He admitted compliance with Pradaxa for the last 4 weeks.  The patient underwent successful cardioversion in the emergency room with plans for follow up today.  He is doing well.  No further palpitations.  No chest pain, dyspnea, syncope or near syncope, orthopnea, PND, edema.    Wt Readings from Last 3 Encounters:  06/16/12 336 lb (152.409 kg)  05/05/12 340 lb (154.223 kg)  04/29/12 343 lb (155.584 kg)     Potassium  Date/Time Value Range Status  06/09/2012  9:00 AM 4.2  3.5 - 5.1 mEq/L Final     Creatinine, Ser  Date/Time Value Range Status  06/09/2012  9:00 AM 0.72  0.50 - 1.35 mg/dL Final   Magnesium  Date/Time Value Range Status  06/09/2012  9:00 AM 2.0  1.5 - 2.5 mg/dL  Final    Past Medical History  Diagnosis Date  . HYPERLIPIDEMIA 03/22/2009  . Obesity, unspecified 04/24/2009  . THROMBOCYTOPENIA 08/16/2010  . ERECTILE DYSFUNCTION 03/22/2009  . SLEEP APNEA, OBSTRUCTIVE 03/22/2009    compliant with CPAP  . 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 Coronary atherosclerosis of native coronary artery 11/2002    a. s/p stent to LAD 12/03; OM2 occluded at cath 12/03; d. myoview 5/10: no ischemia;  e. echo 7/11: EF 55%, BAE, mild RVE, PASP 41-45; Myoview was in March 2013. There was no ischemia or infarction, EF 51%   . Atrial fibrillation 03/22/2009    a. s/p multiple DCCV; b. no coumadin due to low TE risk profile; c. Tikosyn Rx  . GERD 03/22/2009  . LATERAL EPICONDYLITIS, LEFT 10/24/2009  . HYPERGLYCEMIA 04/25/2010  . LIVER FUNCTION TESTS, ABNORMAL 04/25/2010  . TOBACCO USE, QUIT 10/24/2009  . Umbilical hernia   . Myocardial infarction mi2003    Current Outpatient Prescriptions  Medication Sig Dispense Refill  . atorvastatin (LIPITOR) 80 MG tablet Take 1 tablet (80 mg total) by mouth daily.  90 tablet  3  . b complex vitamins tablet Take 1 tablet by mouth daily.      [name] b complex vitamins tablet Take 1 tablet by mouth daily.      . dabigatran (PRADAXA) 150 MG CAPS Take 1 capsule (150 mg total) by mouth every 12 (twelve)  hours.  60 capsule  3  . dofetilide (TIKOSYN) 500 MCG capsule Take 1 capsule (500 mcg total) by mouth 2 (two) times daily.  60 capsule  6  . fish oil-omega-3 fatty acids 1000 MG capsule Take 2 g by mouth daily.        . magnesium gluconate (MAGONATE) 500 MG tablet Take 500 mg by mouth daily.       . metFORMIN (GLUCOPHAGE-XR) 500 MG 24 hr tablet Take 1 tablet (500 mg total) by mouth daily with breakfast.  90 tablet  3  . MULTIPLE VITAMIN PO Take 1 tablet by mouth daily.       . nitroGLYCERIN (NITROSTAT) 0.4 MG SL tablet Place 1 tablet (0.4 mg total) under the tongue every 5 (five) minutes as needed for chest pain.  25 tablet  3  . pantoprazole  (PROTONIX) 40 MG tablet Take 1 tablet (40 mg total) by mouth daily.  90 tablet  3  . potassium chloride SA (KLOR-CON M20) 20 MEQ tablet Take 1 tablet (20 mEq total) by mouth daily.  30 tablet  5  . zolpidem (AMBIEN) 10 MG tablet Take 1 tablet (10 mg total) by mouth at bedtime as needed.  90 tablet  1    Allergies: Allergies  Allergen Reactions  . Penicillins   . Procaine Hcl     REACTION: Reaction not known  . Pseudoephedrine Other (See Comments)    Patient went into afib  . Sulfonamide Derivatives     History  Substance Use Topics  . Smoking status: Former Smoker -- 2.0 packs/day for 42 years    Types: Cigarettes    Quit date: 12/30/2008  . Smokeless tobacco: Never Used   Comment: started at age 63; 1-2 ppd; quit 2010  . Alcohol Use: No      PHYSICAL EXAM: VS:  BP 133/61  Pulse 67  Ht 6\' 2"  (1.88 m)  Wt 336 lb (152.409 kg)  BMI 43.14 kg/m2 Well nourished, well developed, in no acute distress HEENT: normal Neck: no JVD Cardiac:  normal S1, S2; RRR; no murmur Lungs:  clear to auscultation bilaterally, no wheezing, rhonchi or rales Abd: soft, nontender, no hepatomegaly Ext: no edema Skin: warm and dry Neuro:  CNs 2-12 intact, no focal abnormalities noted  EKG:  Sinus bradycardia, heart rate 59, normal axis, nonspecific ST-T wave changes, QTc 447 ms   ASSESSMENT AND PLAN:  1.  Persistent Atrial Fibrillation Maintaining sinus rhythm. He remains on Tikosyn and Pradaxa. Recent potassium and magnesium acceptable. QTC is also acceptable. He is working on weight loss. Followup with Dr. [name] in 09/2012 as previously scheduled.  2.  Coronary Artery Disease Stable.  No angina.  Continue statin.  He is not on aspirin in the setting of Pradaxa therapy.   Luna Glasgow, PA-C  3:54 PM 06/16/2012

## 2012-06-16 NOTE — Patient Instructions (Addendum)
Your physician recommends that you schedule a follow-up appointment in: October

## 2012-07-09 ENCOUNTER — Other Ambulatory Visit: Payer: Self-pay | Admitting: Cardiology

## 2012-07-30 ENCOUNTER — Other Ambulatory Visit (INDEPENDENT_AMBULATORY_CARE_PROVIDER_SITE_OTHER): Payer: 59

## 2012-07-30 DIAGNOSIS — E785 Hyperlipidemia, unspecified: Secondary | ICD-10-CM

## 2012-07-30 DIAGNOSIS — E119 Type 2 diabetes mellitus without complications: Secondary | ICD-10-CM

## 2012-07-30 LAB — HEPATIC FUNCTION PANEL
ALT: 62 U/L — ABNORMAL HIGH (ref 0–53)
AST: 33 U/L (ref 0–37)
Albumin: 4.4 g/dL (ref 3.5–5.2)
Alkaline Phosphatase: 59 U/L (ref 39–117)
Bilirubin, Direct: 0.2 mg/dL (ref 0.0–0.3)
Total Bilirubin: 1.4 mg/dL — ABNORMAL HIGH (ref 0.3–1.2)
Total Protein: 7.3 g/dL (ref 6.0–8.3)

## 2012-07-30 LAB — BASIC METABOLIC PANEL
BUN: 17 mg/dL (ref 6–23)
CO2: 29 mEq/L (ref 19–32)
Calcium: 9.6 mg/dL (ref 8.4–10.5)
Chloride: 101 mEq/L (ref 96–112)
Creatinine, Ser: 0.8 mg/dL (ref 0.4–1.5)
GFR: 99.55 mL/min (ref >60.00)
Glucose, Bld: 117 mg/dL — ABNORMAL HIGH (ref 70–99)
Potassium: 4.4 mEq/L (ref 3.5–5.1)
Sodium: 138 mEq/L (ref 135–145)

## 2012-07-30 LAB — MICROALBUMIN / CREATININE URINE RATIO
Creatinine,U: 157.1 mg/dL
Microalb Creat Ratio: 2 mg/g (ref 0.0–30.0)
Microalb, Ur: 3.2 mg/dL — ABNORMAL HIGH (ref 0.0–1.9)

## 2012-07-30 LAB — MAGNESIUM: Magnesium: 1.9 mg/dL (ref 1.5–2.5)

## 2012-07-30 LAB — HEMOGLOBIN A1C: Hgb A1c MFr Bld: 6.3 % (ref 4.6–6.5)

## 2012-07-30 NOTE — Addendum Note (Signed)
Addended by: Rita Ohara R on: 07/30/2012 08:26 AM   Modules accepted: Orders

## 2012-08-06 ENCOUNTER — Encounter: Payer: Self-pay | Admitting: Internal Medicine

## 2012-08-06 ENCOUNTER — Ambulatory Visit (INDEPENDENT_AMBULATORY_CARE_PROVIDER_SITE_OTHER): Payer: 59 | Admitting: Internal Medicine

## 2012-08-06 VITALS — BP 132/70 | HR 75 | Temp 98.4°F | Wt 334.6 lb

## 2012-08-06 DIAGNOSIS — I4891 Unspecified atrial fibrillation: Secondary | ICD-10-CM

## 2012-08-06 DIAGNOSIS — Z79899 Other long term (current) drug therapy: Secondary | ICD-10-CM

## 2012-08-06 DIAGNOSIS — E119 Type 2 diabetes mellitus without complications: Secondary | ICD-10-CM

## 2012-08-06 DIAGNOSIS — H9190 Unspecified hearing loss, unspecified ear: Secondary | ICD-10-CM | POA: Insufficient documentation

## 2012-08-06 DIAGNOSIS — E669 Obesity, unspecified: Secondary | ICD-10-CM

## 2012-08-06 DIAGNOSIS — K649 Unspecified hemorrhoids: Secondary | ICD-10-CM

## 2012-08-06 NOTE — Progress Notes (Signed)
Subjective:    Patient ID: [name] [name], male    DOB: 1951-04-21, 61 y.o.   MRN: [id number]  HPI Patient comes in today for follow up of  multiple medical problems.  Since her last visit she's been taking the metformin regularly had a mild GI upset early but is doing fine with that now his try to cut out sugars and simple carbs has lost some weight in this manner. Even though he has been on vacation recently. He is to see cardiology soon no specific leg cramps a recurrence of his atrial fib he is on protects a. He had some problems with his hemorrhoids bleeding but apparently is okay now. No new cardiovascular or pulmonary symptoms.  He is having problems hearing has had some hearing damage in the past he thinks his hearing is getting worse he is a referral for evaluation. No URI symptoms or acute events. Review of Systems Negative currently for chest pain shortness of breath cough numbness in his feet ulcers on his feet or major vision changes.  Past history family history social history reviewed in the electronic medical record.  Outpatient Encounter Prescriptions as of 08/06/2012  Medication Sig Dispense Refill  . atorvastatin (LIPITOR) 80 MG tablet Take 1 tablet (80 mg total) by mouth daily.  90 tablet  3  . b complex vitamins tablet Take 1 tablet by mouth daily.      . dabigatran (PRADAXA) 150 MG CAPS Take 1 capsule (150 mg total) by mouth every 12 (twelve) hours.  60 capsule  3  . dofetilide (TIKOSYN) 500 MCG capsule Take 1 capsule (500 mcg total) by mouth 2 (two) times daily.  60 capsule  6  . fish oil-omega-3 fatty acids 1000 MG capsule Take 2 g by mouth daily.        [name] KLOR-CON M20 20 MEQ tablet TAKE 1 TABLET BY MOUTH EVERY DAY  30 tablet  5  . magnesium gluconate (MAGONATE) 500 MG tablet Take 500 mg by mouth daily.       . metFORMIN (GLUCOPHAGE-XR) 500 MG 24 hr tablet Take 1 tablet (500 mg total) by mouth daily with breakfast.  90 tablet  3  . MULTIPLE VITAMIN PO Take 1 tablet by  mouth daily.       . nitroGLYCERIN (NITROSTAT) 0.4 MG SL tablet Place 1 tablet (0.4 mg total) under the tongue every 5 (five) minutes as needed for chest pain.  25 tablet  3  . pantoprazole (PROTONIX) 40 MG tablet Take 1 tablet (40 mg total) by mouth daily.  90 tablet  3  . zolpidem (AMBIEN) 10 MG tablet Take 1 tablet (10 mg total) by mouth at bedtime as needed.  90 tablet  1  . DISCONTD: b complex vitamins tablet Take 1 tablet by mouth daily.           Objective:   Physical Exam BP 132/70  Pulse 75  Temp 98.4 F (36.9 C) (Oral)  Wt 334 lb 9.6 oz (151.774 kg)  SpO2 98%  Wt Readings from Last 3 Encounters:  08/06/12 334 lb 9.6 oz (151.774 kg)  06/16/12 336 lb (152.409 kg)  05/05/12 340 lb (154.223 kg)   Well-developed well-nourished in no acute distress cognitively intact chest clear auscultation examination of feet shows no unusual ulcers edema or lesions. Pulses are present. Examination of ears TMs intact moderate amount of soft brown wax in both ears. Not impacted. Review of laboratory studies A1c is down to 6.3 magnesium is 1.9. Kidney  function normal. Minor elevation of transaminase persistent.    Assessment & Plan:   DM  new onset no complications at time improved on the A1c test  continue metformin check A1c in about 3 months if okay office visit in 6 months. Otherwise we'll have him followup earlier. Discussed hepatitis a and B. vaccines for diabetes he has been immunized for hepatitis B in the past we'll begin A series today and in 6 months booster.  Obesity  continue lifestyle intervention and weight loss Recurrent A. Fib   Difficulty hearing mild wax in ears can use eardrops to soften up a time to flush today but ear canals are patent and TM is intact. We'll do and not a lot referral. Magnesium level I.9 borderline to prevent arrhythmia increase his magnesium to one and a half a day check the reading magnesium in about 3 weeks if okay continue their other preparations of  magnesium he can discuss this with cardiology also. Anticoagulation pradaxa

## 2012-08-06 NOTE — Patient Instructions (Signed)
Your diabetes control is much better. Continue the metformin and lifestyle changes. Increase the magnesium to 1-1/2 a day we will check magnesium and potassium in 3-4 weeks.  Otherwise check hemoglobin A1c in 3 months if okay  ROP in 6 months with hemoglobin A1c previsit.  You should probably have the hepatitis A vaccine because of the diabetes you shouldn't need a hepatitis B vaccine because you've been immunized in the past.

## 2012-08-17 ENCOUNTER — Other Ambulatory Visit: Payer: Self-pay | Admitting: Internal Medicine

## 2012-08-19 ENCOUNTER — Other Ambulatory Visit: Payer: Self-pay

## 2012-08-19 MED ORDER — DOFETILIDE 500 MCG PO CAPS: 500.0000 ug | ORAL_CAPSULE | Freq: Two times a day (BID) | ORAL | Status: DC

## 2012-08-19 NOTE — Telephone Encounter (Signed)
..   Requested Prescriptions   Signed Prescriptions Disp Refills  . dofetilide (TIKOSYN) 500 MCG capsule 60 capsule 6    Sig: Take 1 capsule (500 mcg total) by mouth 2 (two) times daily.    Authorizing Provider: Lewayne Bunting    Ordering User: Christella Hartigan, Duong Haydel M  ..Patient needs to contact office to schedule  Appointment  for October .Ph:[phone]. Thank you.

## 2012-08-27 ENCOUNTER — Other Ambulatory Visit (INDEPENDENT_AMBULATORY_CARE_PROVIDER_SITE_OTHER): Payer: 59

## 2012-08-27 DIAGNOSIS — D696 Thrombocytopenia, unspecified: Secondary | ICD-10-CM

## 2012-08-27 LAB — MAGNESIUM: Magnesium: 2.1 mg/dL (ref 1.5–2.5)

## 2012-08-27 LAB — POTASSIUM: Potassium: 4.3 mEq/L (ref 3.5–5.1)

## 2012-09-24 ENCOUNTER — Telehealth: Payer: Self-pay | Admitting: Cardiology

## 2012-09-24 NOTE — Telephone Encounter (Signed)
Forward 1 page from [facility] to Dr. Olga Millers for review on 09-24-12 ym

## 2012-09-30 ENCOUNTER — Encounter: Payer: Self-pay | Admitting: Internal Medicine

## 2012-10-05 ENCOUNTER — Ambulatory Visit (INDEPENDENT_AMBULATORY_CARE_PROVIDER_SITE_OTHER): Payer: 59 | Admitting: Family Medicine

## 2012-10-05 DIAGNOSIS — Z23 Encounter for immunization: Secondary | ICD-10-CM

## 2012-10-26 ENCOUNTER — Encounter: Payer: Self-pay | Admitting: Cardiology

## 2012-10-26 ENCOUNTER — Ambulatory Visit (INDEPENDENT_AMBULATORY_CARE_PROVIDER_SITE_OTHER): Payer: 59 | Admitting: Cardiology

## 2012-10-26 VITALS — BP 123/72 | HR 64 | Ht 73.0 in | Wt 334.0 lb

## 2012-10-26 DIAGNOSIS — I4891 Unspecified atrial fibrillation: Secondary | ICD-10-CM

## 2012-10-26 DIAGNOSIS — I2510 Atherosclerotic heart disease of native coronary artery without angina pectoris: Secondary | ICD-10-CM

## 2012-10-26 DIAGNOSIS — E785 Hyperlipidemia, unspecified: Secondary | ICD-10-CM

## 2012-10-26 NOTE — Assessment & Plan Note (Signed)
Continue statin. 

## 2012-10-26 NOTE — Assessment & Plan Note (Signed)
Continue statin. Aspirin has been discontinued given need for anticoagulation with history of paroxysmal atrial fibrillation.

## 2012-10-26 NOTE — Patient Instructions (Addendum)
Your physician wants you to follow-up in: 6 MONTHS WITH DR CRENSHAW You will receive a reminder letter in the mail two months in advance. If you don't receive a letter, please call our office to schedule the follow-up appointment.  

## 2012-10-26 NOTE — Progress Notes (Signed)
HPI: Mr. Rammer is a pleasant gentleman who has a history of coronary artery disease status post stent to his LAD in December 2003 as well as paroxysmal atrial fibrillation. Admitted with frequent PVCs in July 2011. Noted to be bradycardic and beta blocker changed to p.r.n. TSH and enzymes were normal. Last echocardiogram in July 2011 showed normal LV function, mild biatrial enlargement and mild right ventricular enlargement. Last Myoview was in March 2013. There was no ischemia or infarction. The calculated ejection fraction was 51%. Saw Dr. Johney Frame previously and AFib ablation could be considered if patient lost weight. Patient had an episode of atrial fibrillation in June of 2013 and was cardioverted. Since that time, the patient denies any dyspnea on exertion, orthopnea, PND, pedal edema, palpitations, syncope or chest pain.    Current Outpatient Prescriptions  Medication Sig Dispense Refill  . atorvastatin (LIPITOR) 80 MG tablet Take 1 tablet (80 mg total) by mouth daily.  90 tablet  3  . b complex vitamins tablet Take 1 tablet by mouth daily.      [name] dofetilide (TIKOSYN) 500 MCG capsule Take 1 capsule (500 mcg total) by mouth 2 (two) times daily.  60 capsule  6  . fish oil-omega-3 fatty acids 1000 MG capsule Take 2 g by mouth daily.        [name] KLOR-CON M20 20 MEQ tablet TAKE 1 TABLET BY MOUTH EVERY DAY  30 tablet  5  . magnesium gluconate (MAGONATE) 500 MG tablet Take 1.5 tablets (750 mg total) by mouth daily.  45 tablet  0  . metFORMIN (GLUCOPHAGE-XR) 500 MG 24 hr tablet Take 1 tablet (500 mg total) by mouth daily with breakfast.  90 tablet  3  . MULTIPLE VITAMIN PO Take 1 tablet by mouth daily.       . pantoprazole (PROTONIX) 40 MG tablet Take 1 tablet (40 mg total) by mouth daily.  90 tablet  3  . PRADAXA 150 MG CAPS TAKE ONE CAPSULE BY MOUTH EVERY 12 HOURS  60 capsule  5  . zolpidem (AMBIEN) 10 MG tablet Take 1 tablet (10 mg total) by mouth at bedtime as needed.  90 tablet  1  .  nitroGLYCERIN (NITROSTAT) 0.4 MG SL tablet Place 1 tablet (0.4 mg total) under the tongue every 5 (five) minutes as needed for chest pain.  25 tablet  3     Past Medical History  Diagnosis Date  . HYPERLIPIDEMIA 03/22/2009  . Obesity, unspecified 04/24/2009  . THROMBOCYTOPENIA 08/16/2010  . ERECTILE DYSFUNCTION 03/22/2009  . SLEEP APNEA, OBSTRUCTIVE 03/22/2009    compliant with CPAP  . 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 Coronary atherosclerosis of native coronary artery 11/2002    a. s/p stent to LAD 12/03; OM2 occluded at cath 12/03; d. myoview 5/10: no ischemia;  e. echo 7/11: EF 55%, BAE, mild RVE, PASP 41-45; Myoview was in March 2013. There was no ischemia or infarction, EF 51%   . Atrial fibrillation 03/22/2009    a. s/p multiple DCCV; b. no coumadin due to low TE risk profile; c. Tikosyn Rx  . GERD 03/22/2009  . LATERAL EPICONDYLITIS, LEFT 10/24/2009  . HYPERGLYCEMIA 04/25/2010  . LIVER FUNCTION TESTS, ABNORMAL 04/25/2010  . TOBACCO USE, QUIT 10/24/2009  . Umbilical hernia   . Myocardial infarction mi2003    Past Surgical History  Procedure Date  . Cyst on epiglottis 08/2002  . Surgery l4-l5  1998    ruptured x 3  . Stent  LAD DUS 2004  . Ulnar neuropathy   . Umbilical hernia repair   . Lumbar disc surgery     two  holes in spinalcovering  . Esophagogastroduodenoscopy 11/15/2011    Procedure: ESOPHAGOGASTRODUODENOSCOPY (EGD);  Surgeon: Charna Elizabeth, MD;  Location: [facility];  Service: Endoscopy;  Laterality: N/A;  . Colonoscopy 11/15/2011    Procedure: COLONOSCOPY;  Surgeon: Charna Elizabeth, MD;  Location: [facility];  Service: Endoscopy;  Laterality: N/A;    History   Social History  . Marital Status: Married    Spouse Name: N/A    Number of Children: N/A  . Years of Education: N/A   Occupational History  . Not on file.   Social History Main Topics  . Smoking status: Former Smoker -- 2.0 packs/day for 42 years    Types: Cigarettes    Quit date: 12/30/2008    . Smokeless tobacco: Never Used   Comment: started at age 1; 1-2 ppd; quit 2010  . Alcohol Use: No  . Drug Use: No  . Sexually Active: Yes   Other Topics Concern  . 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 Back surgery Has older dogRetired from stat 31 years of service now 7 years     ROS: no fevers or chills, productive cough, hemoptysis, dysphasia, odynophagia, melena, hematochezia, dysuria, hematuria, rash, seizure activity, orthopnea, PND, pedal edema, claudication. Remaining systems are negative.  Physical Exam: Well-developed obese in no acute distress.  Skin is warm and dry.  HEENT is normal.  Neck is supple.  Chest is clear to auscultation with normal expansion.  Cardiovascular exam is regular rate and rhythm.  Abdominal exam nontender or distended. No masses palpated. Extremities show no edema. neuro grossly intact  ECG sinus rhythm at a rate of 64. Low voltage. Normal QT interval.

## 2012-10-26 NOTE — Assessment & Plan Note (Signed)
Patient remains in sinus rhythm. Continue tikosyn and pradaxa. Recent potassium and magnesium normal. Recent renal function normal. Patient now requires anticoagulation as he has embolic risk factor of diabetes mellitus.

## 2012-11-06 ENCOUNTER — Other Ambulatory Visit: Payer: 59

## 2012-11-11 ENCOUNTER — Other Ambulatory Visit (INDEPENDENT_AMBULATORY_CARE_PROVIDER_SITE_OTHER): Payer: 59

## 2012-11-11 DIAGNOSIS — IMO0001 Reserved for inherently not codable concepts without codable children: Secondary | ICD-10-CM

## 2012-11-11 LAB — HEMOGLOBIN A1C: Hgb A1c MFr Bld: 6.5 % (ref 4.6–6.5)

## 2012-12-19 ENCOUNTER — Other Ambulatory Visit: Payer: Self-pay | Admitting: Cardiology

## 2013-01-19 ENCOUNTER — Encounter: Payer: Self-pay | Admitting: Pulmonary Disease

## 2013-01-19 ENCOUNTER — Ambulatory Visit (INDEPENDENT_AMBULATORY_CARE_PROVIDER_SITE_OTHER): Payer: 59 | Admitting: Pulmonary Disease

## 2013-01-19 VITALS — BP 142/70 | HR 71 | Temp 98.4°F | Ht 73.0 in | Wt 347.0 lb

## 2013-01-19 DIAGNOSIS — G4733 Obstructive sleep apnea (adult) (pediatric): Secondary | ICD-10-CM

## 2013-01-19 NOTE — Patient Instructions (Addendum)
Continue with cpap, keep up with mask changes and supplies Work on weight loss followup with me again in one year.

## 2013-01-19 NOTE — Progress Notes (Signed)
  Subjective:    Patient ID: [name] [name], male    DOB: [birth year], 62 y.o.   MRN: [id number]  HPI The patient comes in today for followup of his obstructive sleep apnea.  He is wearing CPAP compliantly, and is having no issues with his mask or machine.  He feels that he sleeps well, and is rested during the day with no significant sleepiness.  His weight has declined about 3 pounds since last visit.   Review of Systems  Constitutional: Negative for fever and unexpected weight change.  HENT: Positive for congestion, rhinorrhea, sneezing, postnasal drip and sinus pressure. Negative for ear pain, nosebleeds, sore throat, trouble swallowing and dental problem.        Patient states that these symptoms are "episodic"  Eyes: Negative for redness and itching.  Respiratory: Positive for cough ( in the mornings and after showers---productive, clear). Negative for chest tightness, shortness of breath and wheezing.   Cardiovascular: Negative for palpitations and leg swelling.  Gastrointestinal: Negative for nausea and vomiting.  Genitourinary: Negative for dysuria.  Musculoskeletal: Negative for joint swelling.  Skin: Negative for rash.  Neurological: Negative for headaches.  Hematological: Does not bruise/bleed easily.  Psychiatric/Behavioral: Negative for dysphoric mood. The patient is not nervous/anxious.        Objective:   Physical Exam Morbidly obese male in no acute distress Nose without purulence or discharge noted No skin breakdown or pressure necrosis from the CPAP mask Neck without lymphadenopathy or thyromegaly Lower extremities with minimal edema,  No cyanosis Alert, does not appear to be sleepy, moves all 4 extremities.       Assessment & Plan:

## 2013-01-19 NOTE — Assessment & Plan Note (Signed)
The patient is wearing CPAP compliantly, and is having no issues with his equipment.  He feels that he sleeps well, and is satisfied with his daytime alertness.  I have asked him to continue with CPAP, keep up with mask changes and supplies, and to work aggressively on weight loss.  He will see me in one year if doing well.

## 2013-02-01 ENCOUNTER — Other Ambulatory Visit: Payer: Self-pay | Admitting: Internal Medicine

## 2013-02-05 ENCOUNTER — Other Ambulatory Visit (INDEPENDENT_AMBULATORY_CARE_PROVIDER_SITE_OTHER): Payer: 59

## 2013-02-05 DIAGNOSIS — IMO0001 Reserved for inherently not codable concepts without codable children: Secondary | ICD-10-CM

## 2013-02-05 LAB — HEMOGLOBIN A1C: Hgb A1c MFr Bld: 6.9 % — ABNORMAL HIGH (ref 4.6–6.5)

## 2013-02-12 ENCOUNTER — Ambulatory Visit: Payer: 59 | Admitting: Internal Medicine

## 2013-02-17 ENCOUNTER — Ambulatory Visit (INDEPENDENT_AMBULATORY_CARE_PROVIDER_SITE_OTHER): Payer: 59 | Admitting: Internal Medicine

## 2013-02-17 ENCOUNTER — Encounter: Payer: Self-pay | Admitting: Internal Medicine

## 2013-02-17 VITALS — BP 136/78 | HR 82 | Wt 345.0 lb

## 2013-02-17 DIAGNOSIS — I2510 Atherosclerotic heart disease of native coronary artery without angina pectoris: Secondary | ICD-10-CM

## 2013-02-17 DIAGNOSIS — I252 Old myocardial infarction: Secondary | ICD-10-CM

## 2013-02-17 DIAGNOSIS — Z7901 Long term (current) use of anticoagulants: Secondary | ICD-10-CM

## 2013-02-17 DIAGNOSIS — E785 Hyperlipidemia, unspecified: Secondary | ICD-10-CM

## 2013-02-17 DIAGNOSIS — Z5181 Encounter for therapeutic drug level monitoring: Secondary | ICD-10-CM

## 2013-02-17 DIAGNOSIS — E669 Obesity, unspecified: Secondary | ICD-10-CM

## 2013-02-17 DIAGNOSIS — E119 Type 2 diabetes mellitus without complications: Secondary | ICD-10-CM

## 2013-02-17 DIAGNOSIS — G4733 Obstructive sleep apnea (adult) (pediatric): Secondary | ICD-10-CM

## 2013-02-17 MED ORDER — PANTOPRAZOLE SODIUM 40 MG PO TBEC: 40.0000 mg | DELAYED_RELEASE_TABLET | Freq: Every day | ORAL | Status: DC

## 2013-02-17 MED ORDER — ZOLPIDEM TARTRATE 10 MG PO TABS: 10.0000 mg | ORAL_TABLET | Freq: Every evening | ORAL | Status: DC | PRN

## 2013-02-17 MED ORDER — ATORVASTATIN CALCIUM 80 MG PO TABS: 80.0000 mg | ORAL_TABLET | Freq: Every day | ORAL | Status: DC

## 2013-02-17 NOTE — Patient Instructions (Addendum)
Intensify lifestyle interventions. To keep Dm under control.   CPX with labs and hg a1c  An urine microabuminuria  ratio.

## 2013-02-17 NOTE — Progress Notes (Signed)
Chief Complaint  Patient presents with  . Follow-up    HPI: Patient comes in today for follow up of  multiple medical problems.  Since his last visit she's he has had stable cardiac condition continues on protects the. No unusual bleeding. Has gained a few pounds over the hallway holidays and then is coming down.  In regard to his early diabetes he is taking medication without significant side effect no unusual lows numbness major vision changes. He continues to stay on proton next and needs a refill. He takes Ambien at night continuously he has sleep apnea this is stable. Needs a refill on his Lipitor no significant side effects up-to-date on labs.  Denies any current chest pain edema bleeding infection. ROS: See pertinent positives and negatives per HPI. Has a remote history of depression treatment on SSRI for about 6-9 months currently does not feel depressed. Does feel somewhat sad about his wife's medical condition.  Past Medical History  Diagnosis Date  . HYPERLIPIDEMIA 03/22/2009  . Obesity, unspecified 04/24/2009  . THROMBOCYTOPENIA 08/16/2010  . ERECTILE DYSFUNCTION 03/22/2009  . SLEEP APNEA, OBSTRUCTIVE 03/22/2009    compliant with CPAP  . 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 Coronary atherosclerosis of native coronary artery 11/2002    a. s/p stent to LAD 12/03; OM2 occluded at cath 12/03; d. myoview 5/10: no ischemia;  e. echo 7/11: EF 55%, BAE, mild RVE, PASP 41-45; Myoview was in March 2013. There was no ischemia or infarction, EF 51%   . Atrial fibrillation 03/22/2009    a. s/p multiple DCCV; b. no coumadin due to low TE risk profile; c. Tikosyn Rx  . GERD 03/22/2009  . LATERAL EPICONDYLITIS, LEFT 10/24/2009  . HYPERGLYCEMIA 04/25/2010  . LIVER FUNCTION TESTS, ABNORMAL 04/25/2010  . TOBACCO USE, QUIT 10/24/2009  . Umbilical hernia   . Myocardial infarction mi2003    Family History  Problem Relation Age of Onset  . Thyroid disease Mother   . Ovarian cancer Mother   .  Breast cancer Mother   . Lung cancer Father   . Cancer Father     History   Social History  . Marital Status: Married    Spouse Name: N/A    Number of Children: N/A  . Years of Education: N/A   Social History Main Topics  . Smoking status: Former Smoker -- 2.00 packs/day for 42 years    Types: Cigarettes    Quit date: 12/30/2008  . Smokeless tobacco: Never Used     Comment: started at age 38; 1-2 ppd; quit 2010  . Alcohol Use: No  . Drug Use: No  . Sexually Active: Yes   Other Topics Concern  . None   Social History Narrative   Retired paramedic   Regular exercise-yes not recently    Has children   Wife is overweight and has fibromyalgia and depression on disability doesn't go out much. Back surgery    Has older dog   Retired from [employer] 31 years of service now 7 years     Outpatient Encounter Prescriptions as of 02/17/2013  Medication Sig Dispense Refill  . atorvastatin (LIPITOR) 80 MG tablet Take 1 tablet (80 mg total) by mouth daily.  90 tablet  3  . b complex vitamins tablet Take 1 tablet by mouth daily.      [name] dofetilide (TIKOSYN) 500 MCG capsule Take 1 capsule (500 mcg total) by mouth 2 (two) times daily.  60 capsule  6  . famotidine (PEPCID) 20 MG  tablet Take 20 mg by mouth as needed.      . fish oil-omega-3 fatty acids 1000 MG capsule Take 2 g by mouth daily.        [name] KLOR-CON M20 20 MEQ tablet TAKE 1 TABLET BY MOUTH EVERY DAY  30 tablet  5  . magnesium gluconate (MAGONATE) 500 MG tablet Take 1.5 tablets (750 mg total) by mouth daily.  45 tablet  0  . metFORMIN (GLUCOPHAGE-XR) 500 MG 24 hr tablet Take 1 tablet (500 mg total) by mouth daily with breakfast.  90 tablet  3  . MULTIPLE VITAMIN PO Take 1 tablet by mouth daily.       . pantoprazole (PROTONIX) 40 MG tablet Take 1 tablet (40 mg total) by mouth daily.  90 tablet  3  . PRADAXA 150 MG CAPS TAKE ONE CAPSULE BY MOUTH EVERY 12 HOURS  60 capsule  5  . zolpidem (AMBIEN) 10 MG tablet Take 1 tablet (10 mg total) by  mouth at bedtime as needed.  90 tablet  1  . [DISCONTINUED] atorvastatin (LIPITOR) 80 MG tablet Take 1 tablet (80 mg total) by mouth daily.  90 tablet  3  . [DISCONTINUED] pantoprazole (PROTONIX) 40 MG tablet Take 1 tablet (40 mg total) by mouth daily.  90 tablet  3  . [DISCONTINUED] zolpidem (AMBIEN) 10 MG tablet Take 1 tablet (10 mg total) by mouth at bedtime as needed.  90 tablet  1  . nitroGLYCERIN (NITROSTAT) 0.4 MG SL tablet Place 0.4 mg under the tongue every 5 (five) minutes as needed.       No facility-administered encounter medications on file as of 02/17/2013.    EXAM:  BP 136/78  Pulse 82  Wt 345 lb (156.491 kg)  BMI 45.53 kg/m2  SpO2 93%  Body mass index is 45.53 kg/(m^2).  GENERAL: vitals reviewed and listed above, alert, oriented, appears well hydrated and in no acute distress  HEENT: atraumatic, conjunctiva  clear, no obvious abnormalities on inspection of external nose and ears wears glasses OP : no lesion edema or exudate   NECK: no obvious masses on inspection palpation no adenopathy no bruit  LUNGS: clear to auscultation bilaterally, no wheezes, rales or rhonchi, good air movement  CV: HRRR, no clubbing cyanosis or  peripheral edema nl cap refill  Abdomen no obvious masses large no obvious hernia MS: moves all extremities without noticeable focal  abnormality  PSYCH: pleasant and cooperative, no obvious depression or anxiety Lab Results  Component Value Date   WBC 8.6 06/09/2012   HGB 17.5* 06/09/2012   HCT 48.8 06/09/2012   PLT 140* 06/09/2012   GLUCOSE 117* 07/30/2012   CHOL 113 04/22/2012   TRIG 130.0 04/22/2012   HDL 28.40* 04/22/2012   LDLCALC 59 04/22/2012   ALT 62* 07/30/2012   AST 33 07/30/2012   NA 138 07/30/2012   K 4.3 08/27/2012   CL 101 07/30/2012   CREATININE 0.8 07/30/2012   BUN 17 07/30/2012   CO2 29 07/30/2012   TSH 1.21 04/22/2012   PSA 0.82 04/22/2012   INR 1.03 05/18/2011   HGBA1C 6.9* 02/05/2013   MICROALBUR 3.2* 07/30/2012    ASSESSMENT AND  PLAN:  Discussed the following assessment and plan:  Diabetes mellitus, type 2 - still controlled  follow  Coronary atherosclerosis of unspecified type of vessel, native or graft  MYOCARDIAL INFARCTION, HX OF  Obesity, unspecified  HYPERLIPIDEMIA  SLEEP APNEA, OBSTRUCTIVE  Anticoagulation management encounter - continue pradaxa at this time  Risk benefit of medications discussed.  -Patient advised to return or notify health care team  if symptoms worsen or persist or new concerns arise.  Patient Instructions  Intensify lifestyle interventions. To keep Dm under control.   CPX with labs and hg a1c  An urine microabuminuria  ratio.     Neta Mends. Dorothey Oetken M.D.

## 2013-03-02 ENCOUNTER — Other Ambulatory Visit: Payer: Self-pay | Admitting: Cardiology

## 2013-03-29 ENCOUNTER — Other Ambulatory Visit: Payer: Self-pay | Admitting: Internal Medicine

## 2013-04-23 ENCOUNTER — Other Ambulatory Visit: Payer: Self-pay | Admitting: Cardiology

## 2013-04-23 ENCOUNTER — Other Ambulatory Visit: Payer: Self-pay | Admitting: Internal Medicine

## 2013-05-06 ENCOUNTER — Encounter: Payer: Self-pay | Admitting: Cardiology

## 2013-05-06 ENCOUNTER — Ambulatory Visit (INDEPENDENT_AMBULATORY_CARE_PROVIDER_SITE_OTHER): Payer: 59 | Admitting: Cardiology

## 2013-05-06 VITALS — BP 127/74 | HR 70 | Ht 73.0 in | Wt 339.0 lb

## 2013-05-06 DIAGNOSIS — E785 Hyperlipidemia, unspecified: Secondary | ICD-10-CM

## 2013-05-06 DIAGNOSIS — I2510 Atherosclerotic heart disease of native coronary artery without angina pectoris: Secondary | ICD-10-CM

## 2013-05-06 DIAGNOSIS — I4891 Unspecified atrial fibrillation: Secondary | ICD-10-CM

## 2013-05-06 NOTE — Patient Instructions (Addendum)
Your physician wants you to follow-up in: 6 MONTHS WITH DR CRENSHAW You will receive a reminder letter in the mail two months in advance. If you don't receive a letter, please call our office to schedule the follow-up appointment.  

## 2013-05-06 NOTE — Assessment & Plan Note (Signed)
Patient remains in sinus rhythm. Continue tikosyn and pradaxa. Patient is to have blood work drawn by his primary care physician in approximately one month. He will need a CBC, magnesium, potassium and renal function from our standpoint.

## 2013-05-06 NOTE — Assessment & Plan Note (Signed)
Continue statin. Lipids and liver monitored by primary care. 

## 2013-05-06 NOTE — Assessment & Plan Note (Signed)
Continue statin. Not on aspirin given need for anticoagulation. 

## 2013-05-06 NOTE — Progress Notes (Signed)
HPI: [name] is a pleasant gentleman who has a history of coronary artery disease status post stent to his LAD in December 2003 as well as paroxysmal atrial fibrillation. Admitted with frequent PVCs in July 2011. Noted to be bradycardic and beta blocker changed to p.r.n. TSH and enzymes were normal. Last echocardiogram in July 2011 showed normal LV function, mild biatrial enlargement and mild right ventricular enlargement. Last Myoview was in March 2013. There was no ischemia or infarction. The calculated ejection fraction was 51%. Saw Dr. Johney Frame previously and AFib ablation could be considered if patient lost weight. Patient had an episode of atrial fibrillation in June of 2013 and was cardioverted. Last seen in Oct 2013. Since that time, the patient has dyspnea with more extreme activities but not with routine activities. It is relieved with rest. It is not associated with chest pain. There is no orthopnea, PND or pedal edema. There is no syncope or palpitations. There is no exertional chest pain.    Current Outpatient Prescriptions  Medication Sig Dispense Refill  . atorvastatin (LIPITOR) 80 MG tablet Take 1 tablet (80 mg total) by mouth daily.  90 tablet  3  . b complex vitamins tablet Take 1 tablet by mouth daily.      . famotidine (PEPCID) 20 MG tablet Take 20 mg by mouth as needed.      . fish oil-omega-3 fatty acids 1000 MG capsule Take 2 g by mouth daily.        [name] KLOR-CON M20 20 MEQ tablet TAKE 1 TABLET BY MOUTH EVERY DAY  30 tablet  5  . magnesium gluconate (MAGONATE) 500 MG tablet Take 1.5 tablets (750 mg total) by mouth daily.  45 tablet  0  . metFORMIN (GLUCOPHAGE-XR) 500 MG 24 hr tablet TAKE 1 TABLET BY MOUTH WITH BREAKFAST  90 tablet  1  . MULTIPLE VITAMIN PO Take 1 tablet by mouth daily.       . nitroGLYCERIN (NITROSTAT) 0.4 MG SL tablet Place 0.4 mg under the tongue every 5 (five) minutes as needed.      . pantoprazole (PROTONIX) 40 MG tablet Take 1 tablet (40 mg total) by  mouth daily.  90 tablet  3  . PRADAXA 150 MG CAPS TAKE ONE CAPSULE BY MOUTH EVERY 12 HOURS  60 capsule  5  . TIKOSYN 500 MCG capsule TAKE 1 CAPSULE (500 MCG TOTAL) BY MOUTH 2 (TWO) TIMES DAILY.  60 capsule  6  . zolpidem (AMBIEN) 10 MG tablet Take 1 tablet (10 mg total) by mouth at bedtime as needed.  90 tablet  1   No current facility-administered medications for this visit.     Past Medical History  Diagnosis Date  . HYPERLIPIDEMIA 03/22/2009  . Obesity, unspecified 04/24/2009  . THROMBOCYTOPENIA 08/16/2010  . ERECTILE DYSFUNCTION 03/22/2009  . SLEEP APNEA, OBSTRUCTIVE 03/22/2009    compliant with CPAP  . 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 Coronary atherosclerosis of native coronary artery 11/2002    a. s/p stent to LAD 12/03; OM2 occluded at cath 12/03; d. myoview 5/10: no ischemia;  e. echo 7/11: EF 55%, BAE, mild RVE, PASP 41-45; Myoview was in March 2013. There was no ischemia or infarction, EF 51%   . Atrial fibrillation 03/22/2009    a. s/p multiple DCCV; b. no coumadin due to low TE risk profile; c. Tikosyn Rx  . GERD 03/22/2009  . LATERAL EPICONDYLITIS, LEFT 10/24/2009  . HYPERGLYCEMIA 04/25/2010  . LIVER FUNCTION TESTS, ABNORMAL 04/25/2010  .  TOBACCO USE, QUIT 10/24/2009  . Umbilical hernia   . Myocardial infarction mi2003    Past Surgical History  Procedure Laterality Date  . Cyst on epiglottis  08/2002  . Surgery l4-l5   1998    ruptured x 3  . Stent      LAD DUS 2004  . Ulnar neuropathy    . Umbilical hernia repair    . Lumbar disc surgery      two  holes in spinalcovering  . Esophagogastroduodenoscopy  11/15/2011    Procedure: ESOPHAGOGASTRODUODENOSCOPY (EGD);  Surgeon: Charna Elizabeth, MD;  Location: [facility];  Service: Endoscopy;  Laterality: N/A;  . Colonoscopy  11/15/2011    Procedure: COLONOSCOPY;  Surgeon: Charna Elizabeth, MD;  Location: [facility];  Service: Endoscopy;  Laterality: N/A;    History   Social History  . Marital Status: Married    Spouse  Name: N/A    Number of Children: N/A  . Years of Education: N/A   Occupational History  . Not on file.   Social History Main Topics  . Smoking status: Former Smoker -- 2.00 packs/day for 42 years    Types: Cigarettes    Quit date: 12/30/2008  . Smokeless tobacco: Never Used     Comment: started at age 22; 1-2 ppd; quit 2010  . Alcohol Use: No  . Drug Use: No  . Sexually Active: Yes   Other Topics Concern  . Not on file   Social History Narrative   Retired paramedic   Regular exercise-yes not recently    Has children   Wife is overweight and has fibromyalgia and depression on disability doesn't go out much. Back surgery    Has older dog   Retired from [employer] 31 years of service now 7 years     ROS: no fevers or chills, productive cough, hemoptysis, dysphasia, odynophagia, melena, hematochezia, dysuria, hematuria, rash, seizure activity, orthopnea, PND, pedal edema, claudication. Remaining systems are negative.  Physical Exam: Well-developed obese in no acute distress.  Skin is warm and dry.  HEENT is normal.  Neck is supple.  Chest is clear to auscultation with normal expansion.  Cardiovascular exam is regular rate and rhythm.  Abdominal exam nontender or distended. No masses palpated. Extremities show no edema. Varicosities noted neuro grossly intact  ECG sinus rhythm at a rate of 70. Incomplete right bundle branch block. No ST changes.

## 2013-05-20 ENCOUNTER — Other Ambulatory Visit: Payer: Self-pay | Admitting: Internal Medicine

## 2013-06-11 ENCOUNTER — Other Ambulatory Visit (INDEPENDENT_AMBULATORY_CARE_PROVIDER_SITE_OTHER): Payer: 59

## 2013-06-11 DIAGNOSIS — Z0000 Encounter for general adult medical examination without abnormal findings: Secondary | ICD-10-CM

## 2013-06-11 LAB — MICROALBUMIN / CREATININE URINE RATIO
Creatinine,U: 152.4 mg/dL
Microalb Creat Ratio: 3 mg/g (ref 0.0–30.0)
Microalb, Ur: 4.5 mg/dL — ABNORMAL HIGH (ref 0.0–1.9)

## 2013-06-11 LAB — CBC WITH DIFFERENTIAL/PLATELET
Basophils Absolute: 0 10*3/uL (ref 0.0–0.1)
Basophils Relative: 0.4 % (ref 0.0–3.0)
Eosinophils Absolute: 0.5 10*3/uL (ref 0.0–0.7)
Eosinophils Relative: 7.1 % — ABNORMAL HIGH (ref 0.0–5.0)
HCT: 48.8 % (ref 39.0–52.0)
Hemoglobin: 16.6 g/dL (ref 13.0–17.0)
Lymphocytes Relative: 30.8 % (ref 12.0–46.0)
Lymphs Abs: 2.3 10*3/uL (ref 0.7–4.0)
MCHC: 34 g/dL (ref 30.0–36.0)
MCV: 94 fl (ref 78.0–100.0)
Monocytes Absolute: 0.6 10*3/uL (ref 0.1–1.0)
Monocytes Relative: 8.1 % (ref 3.0–12.0)
Neutro Abs: 4.1 10*3/uL (ref 1.4–7.7)
Neutrophils Relative %: 53.6 % (ref 43.0–77.0)
Platelets: 133 10*3/uL — ABNORMAL LOW (ref 150.0–400.0)
RBC: 5.2 Mil/uL (ref 4.22–5.81)
RDW: 13 % (ref 11.5–14.6)
WBC: 7.6 10*3/uL (ref 4.5–10.5)

## 2013-06-11 LAB — BASIC METABOLIC PANEL
BUN: 21 mg/dL (ref 6–23)
CO2: 24 mEq/L (ref 19–32)
Calcium: 9.2 mg/dL (ref 8.4–10.5)
Chloride: 105 mEq/L (ref 96–112)
Creatinine, Ser: 0.8 mg/dL (ref 0.4–1.5)
GFR: 102.1 mL/min (ref >60.00)
Glucose, Bld: 128 mg/dL — ABNORMAL HIGH (ref 70–99)
Potassium: 4.1 mEq/L (ref 3.5–5.1)
Sodium: 138 mEq/L (ref 135–145)

## 2013-06-11 LAB — LIPID PANEL
Cholesterol: 98 mg/dL (ref 0–200)
HDL: 25.2 mg/dL — ABNORMAL LOW (ref >39.00)
LDL Cholesterol: 36 mg/dL (ref 0–99)
Total CHOL/HDL Ratio: 4
Triglycerides: 184 mg/dL — ABNORMAL HIGH (ref 0.0–149.0)
VLDL: 36.8 mg/dL (ref 0.0–40.0)

## 2013-06-11 LAB — HEMOGLOBIN A1C: Hgb A1c MFr Bld: 6.9 % — ABNORMAL HIGH (ref 4.6–6.5)

## 2013-06-11 LAB — HEPATIC FUNCTION PANEL
ALT: 57 U/L — ABNORMAL HIGH (ref 0–53)
AST: 32 U/L (ref 0–37)
Albumin: 4.1 g/dL (ref 3.5–5.2)
Alkaline Phosphatase: 59 U/L (ref 39–117)
Bilirubin, Direct: 0.3 mg/dL (ref 0.0–0.3)
Total Bilirubin: 1.4 mg/dL — ABNORMAL HIGH (ref 0.3–1.2)
Total Protein: 6.7 g/dL (ref 6.0–8.3)

## 2013-06-11 LAB — TSH: TSH: 2.01 u[IU]/mL (ref 0.35–5.50)

## 2013-06-11 LAB — PSA: PSA: 0.81 ng/mL (ref 0.10–4.00)

## 2013-06-21 ENCOUNTER — Other Ambulatory Visit: Payer: Self-pay | Admitting: Cardiology

## 2013-06-21 ENCOUNTER — Ambulatory Visit (INDEPENDENT_AMBULATORY_CARE_PROVIDER_SITE_OTHER): Payer: 59 | Admitting: Internal Medicine

## 2013-06-21 ENCOUNTER — Encounter: Payer: Self-pay | Admitting: Internal Medicine

## 2013-06-21 VITALS — BP 124/72 | HR 60 | Temp 97.9°F | Ht 72.0 in | Wt 340.0 lb

## 2013-06-21 DIAGNOSIS — Z79899 Other long term (current) drug therapy: Secondary | ICD-10-CM

## 2013-06-21 DIAGNOSIS — R945 Abnormal results of liver function studies: Secondary | ICD-10-CM

## 2013-06-21 DIAGNOSIS — G479 Sleep disorder, unspecified: Secondary | ICD-10-CM | POA: Insufficient documentation

## 2013-06-21 DIAGNOSIS — Z0000 Encounter for general adult medical examination without abnormal findings: Secondary | ICD-10-CM

## 2013-06-21 DIAGNOSIS — G4733 Obstructive sleep apnea (adult) (pediatric): Secondary | ICD-10-CM

## 2013-06-21 DIAGNOSIS — E119 Type 2 diabetes mellitus without complications: Secondary | ICD-10-CM

## 2013-06-21 DIAGNOSIS — I2510 Atherosclerotic heart disease of native coronary artery without angina pectoris: Secondary | ICD-10-CM

## 2013-06-21 LAB — MAGNESIUM: Magnesium: 2.2 mg/dL (ref 1.5–2.5)

## 2013-06-21 NOTE — Patient Instructions (Signed)
Continue lifestyle intervention healthy eating and exercise . Weight loss would help.    Otherwise  6 months  Check with labs pre visit

## 2013-06-21 NOTE — Progress Notes (Signed)
Chief Complaint  Patient presents with  . Annual Exam  . Diabetes    HPI: Patient comes in today for Preventive Health Care visit . Since last visit no repeat cardioversion needed. Has been pretty stable.   Vision  ; no new problem gets yearly check Dr. Noel Gerold  Had hearing test  [place] has own brand of hearing aids. Considering whether to get some  OSA  Fine    Dentist every 3 months .  Gi no change  .  No bleeding Famotidine only handful in couple months.   Sleep still taking Ambien regularly triedtrazadone caused night mare.   Hx of urology evaluation.    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 No current significant prostate symptoms had a PSA done.  Pain attention to diet not as easy to lose weight no numbness and tingling in his feet except residual from his ruptured disc problem and surgery related to his left foot with some medial numbness. He checks his feet rarely no ulcers. ROS:  GEN/ HEENT: No fever, significant weight changes sweats headaches vision problems, CV/ PULM; No chest pain shortness of breath cough, syncope,edema  change in exercise tolerance. GI /GU: No adominal pain, vomiting, change in bowel habits. No blood in the stool. No significant GU symptoms. SKIN/HEME: ,no acute skin rashes suspicious lesions or bleeding. No lymphadenopathy, nodules, masses.  NEURO/ PSYCH:  No neurologic  weakness ;numbness from disc surgery  Nothing new .  Left leg . Ball of  Foot. . No depression anxiety. IMM/ Allergy: No unusual infections.  Allergy .   REST of 12 system review negative except as per HPI   Past Medical History  Diagnosis Date  . HYPERLIPIDEMIA 03/22/2009  . Obesity, unspecified 04/24/2009  . THROMBOCYTOPENIA 08/16/2010  . ERECTILE DYSFUNCTION 03/22/2009  . SLEEP APNEA, OBSTRUCTIVE 03/22/2009    compliant with CPAP  . 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 Coronary atherosclerosis of native coronary artery 11/2002    a. s/p stent to LAD 12/03; OM2 occluded at cath 12/03; d.  myoview 5/10: no ischemia;  e. echo 7/11: EF 55%, BAE, mild RVE, PASP 41-45; Myoview was in March 2013. There was no ischemia or infarction, EF 51%   . Atrial fibrillation 03/22/2009    a. s/p multiple DCCV; b. no coumadin due to low TE risk profile; c. Tikosyn Rx  . GERD 03/22/2009  . LATERAL EPICONDYLITIS, LEFT 10/24/2009  . HYPERGLYCEMIA 04/25/2010  . LIVER FUNCTION TESTS, ABNORMAL 04/25/2010  . TOBACCO USE, QUIT 10/24/2009  . Umbilical hernia   . Myocardial infarction mi2003  . Local reaction to immunization 05/05/2012    minor resolving  zostavax     Family History  Problem Relation Age of Onset  . Thyroid disease Mother   . Ovarian cancer Mother   . Breast cancer Mother   . Lung cancer Father   . Cancer Father     History   Social History  . Marital Status: Married    Spouse Name: N/A    Number of Children: N/A  . Years of Education: N/A   Social History Main Topics  . Smoking status: Former Smoker -- 2.00 packs/day for 42 years    Types: Cigarettes    Quit date: 12/30/2008  . Smokeless tobacco: Never Used     Comment: started at age 38; 1-2 ppd; quit 2010  . Alcohol Use: No  . Drug Use: No  . Sexually Active: Yes   Other Topics Concern  . None   Social  History Narrative   Retired paramedic   Regular exercise-yes not recently    Has children   Wife is overweight and has fibromyalgia and depression on disability doesn't go out much. Back surgery    Has older dog   Retired from [employer] 31 years of service now 7 years     Outpatient Encounter Prescriptions as of 06/21/2013  Medication Sig Dispense Refill  . atorvastatin (LIPITOR) 80 MG tablet Take 1 tablet (80 mg total) by mouth daily.  90 tablet  3  . b complex vitamins tablet Take 1 tablet by mouth daily.      . famotidine (PEPCID) 20 MG tablet Take 20 mg by mouth as needed.      . fish oil-omega-3 fatty acids 1000 MG capsule Take 2 g by mouth daily.        [name] KLOR-CON M20 20 MEQ tablet TAKE 1 TABLET BY MOUTH  EVERY DAY  30 tablet  5  . magnesium gluconate (MAGONATE) 500 MG tablet Take 1.5 tablets (750 mg total) by mouth daily.  45 tablet  0  . metFORMIN (GLUCOPHAGE-XR) 500 MG 24 hr tablet TAKE 1 TABLET BY MOUTH WITH BREAKFAST  90 tablet  3  . MULTIPLE VITAMIN PO Take 1 tablet by mouth daily.       . pantoprazole (PROTONIX) 40 MG tablet Take 1 tablet (40 mg total) by mouth daily.  90 tablet  3  . PRADAXA 150 MG CAPS TAKE ONE CAPSULE BY MOUTH EVERY 12 HOURS  60 capsule  5  . TIKOSYN 500 MCG capsule TAKE 1 CAPSULE (500 MCG TOTAL) BY MOUTH 2 (TWO) TIMES DAILY.  60 capsule  6  . zolpidem (AMBIEN) 10 MG tablet Take 1 tablet (10 mg total) by mouth at bedtime as needed.  90 tablet  1  . nitroGLYCERIN (NITROSTAT) 0.4 MG SL tablet Place 0.4 mg under the tongue every 5 (five) minutes as needed.       No facility-administered encounter medications on file as of 06/21/2013.    EXAM:  BP 124/72  Pulse 60  Temp(Src) 97.9 F (36.6 C) (Oral)  Ht 6' (1.829 m)  Wt 340 lb (154.223 kg)  BMI 46.1 kg/m2  SpO2 95%  Body mass index is 46.1 kg/(m^2).  Physical Exam: Vital signs reviewed ZOX:WRUE is a well-developed well-nourished alert cooperative   male who appears  stated age in no acute distress.  HEENT: normocephalic atraumatic , Eyes: PERRL EOM's full, conjunctiva clear, Nares: paten,t no deformity discharge or tenderness., Ears: no deformity EAC's clear TMs with normal landmarks. Mouth: clear OP, no lesions, edema.  Moist mucous membranes. Dentition in adequate repair. NECK: supple without masses, thyromegaly or bruits. CHEST/PULM:  Clear to auscultation and percussion breath sounds equal no wheeze , rales or rhonchi. No chest wall deformities or tenderness. CV: PMI is nondisplaced, S1 S2 no gallops, murmurs, rubs. Peripheral pulses are full without delay.No JVD .  ABDOMEN: Bowel sounds normal nontender  No guard or rebound, no hepato splenomegal no CVA tenderness.  No hernia. Well healed scar no obvious  masses Extremtities:  No clubbing cyanosis or edema, no acute joint swelling or redness no focal atrophy NEURO:  Oriented x3, cranial nerves 3-12 appear to be intact, no obvious focal weakness,gait within normal limits no abnormal reflexes or asymmetrical SKIN: No acute rashes normal turgor, color, no bruising or petechiae. PSYCH: Oriented, good eye contact, no obvious depression anxiety, cognition and judgment appear normal. LN: no cervical axillary inguinal adenopathy Rectal exam prostate +  1 no obvious nodules noted. Wt Readings from Last 3 Encounters:  06/21/13 340 lb (154.223 kg)  05/06/13 339 lb (153.769 kg)  02/17/13 345 lb (156.491 kg)    Lab Results  Component Value Date   WBC 7.6 06/11/2013   HGB 16.6 06/11/2013   HCT 48.8 06/11/2013   PLT 133.0* 06/11/2013   GLUCOSE 128* 06/11/2013   CHOL 98 06/11/2013   TRIG 184.0* 06/11/2013   HDL 25.20* 06/11/2013   LDLCALC 36 06/11/2013   ALT 57* 06/11/2013   AST 32 06/11/2013   NA 138 06/11/2013   K 4.1 06/11/2013   CL 105 06/11/2013   CREATININE 0.8 06/11/2013   BUN 21 06/11/2013   CO2 24 06/11/2013   TSH 2.01 06/11/2013   PSA 0.81 06/11/2013   INR 1.03 05/18/2011   HGBA1C 6.9* 06/11/2013   MICROALBUR 4.5* 06/11/2013    ASSESSMENT AND PLAN:  Discussed the following assessment and plan:  Visit for preventive health examination - Plan: Magnesium  Diabetes mellitus, type 2 - Stable at this time in a good range continue lifestyle weight loss would be helpful - Plan: Magnesium  Coronary atherosclerosis of unspecified type of vessel, native or graft - Plan: Magnesium  High risk medication use - Magnesium level wasn't drawn at last visit we'll do today to make sure it is adequate - Plan: Magnesium  SLEEP APNEA, OBSTRUCTIVE  Sleep disorder - Has been continuing to use Ambien 10 mg no significant side effect discussed risk benefit and dependency  LIVER FUNCTION TESTS, ABNORMAL - presumed fatty liver  stable   Patient Care Team: Madelin Headings, MD as PCP - General Charna Elizabeth, MD (Gastroenterology) Barbaraann Share, MD (Pulmonary Disease) [facility] Harle Stanford., MD (Urology) Laurice Record, MD (Hematology and Oncology) Lewayne Bunting, MD (Cardiology) Edmon Crape, MD (Ophthalmology) Patient Instructions  Continue lifestyle intervention healthy eating and exercise . Weight loss would help.    Otherwise  6 months  Check with labs pre visit      Neta Mends. Panosh M.D.  Health Maintenance  Topic Date Due  . Influenza Vaccine  08/30/2013  . Tetanus/tdap  03/23/2019  . Colonoscopy  11/14/2021  . Zostavax  Completed   Health Maintenance Review Addendum ;magnesium level was normal to continue.

## 2013-06-22 ENCOUNTER — Encounter: Payer: Self-pay | Admitting: Internal Medicine

## 2013-06-28 ENCOUNTER — Other Ambulatory Visit: Payer: Self-pay | Admitting: Family Medicine

## 2013-07-17 ENCOUNTER — Other Ambulatory Visit: Payer: Self-pay | Admitting: Internal Medicine

## 2013-08-24 ENCOUNTER — Other Ambulatory Visit: Payer: Self-pay | Admitting: Internal Medicine

## 2013-09-09 ENCOUNTER — Other Ambulatory Visit: Payer: Self-pay | Admitting: Internal Medicine

## 2013-09-22 LAB — HM DIABETES EYE EXAM

## 2013-09-24 ENCOUNTER — Encounter: Payer: Self-pay | Admitting: Internal Medicine

## 2013-10-26 ENCOUNTER — Ambulatory Visit (INDEPENDENT_AMBULATORY_CARE_PROVIDER_SITE_OTHER): Payer: 59

## 2013-10-26 DIAGNOSIS — Z23 Encounter for immunization: Secondary | ICD-10-CM

## 2013-11-02 ENCOUNTER — Encounter: Payer: Self-pay | Admitting: Cardiology

## 2013-11-02 ENCOUNTER — Ambulatory Visit (INDEPENDENT_AMBULATORY_CARE_PROVIDER_SITE_OTHER): Payer: 59 | Admitting: Cardiology

## 2013-11-02 VITALS — BP 110/62 | HR 55 | Ht 72.0 in | Wt 342.0 lb

## 2013-11-02 DIAGNOSIS — I2510 Atherosclerotic heart disease of native coronary artery without angina pectoris: Secondary | ICD-10-CM

## 2013-11-02 DIAGNOSIS — I4891 Unspecified atrial fibrillation: Secondary | ICD-10-CM

## 2013-11-02 DIAGNOSIS — E785 Hyperlipidemia, unspecified: Secondary | ICD-10-CM

## 2013-11-02 LAB — CBC WITH DIFFERENTIAL/PLATELET
Basophils Absolute: 0 10*3/uL (ref 0.0–0.1)
Basophils Relative: 0.3 % (ref 0.0–3.0)
Eosinophils Absolute: 0.6 10*3/uL (ref 0.0–0.7)
Eosinophils Relative: 7.1 % — ABNORMAL HIGH (ref 0.0–5.0)
HCT: 47.5 % (ref 39.0–52.0)
Hemoglobin: 16.5 g/dL (ref 13.0–17.0)
Lymphocytes Relative: 28.8 % (ref 12.0–46.0)
Lymphs Abs: 2.4 10*3/uL (ref 0.7–4.0)
MCHC: 34.8 g/dL (ref 30.0–36.0)
MCV: 90.9 fl (ref 78.0–100.0)
Monocytes Absolute: 0.6 10*3/uL (ref 0.1–1.0)
Monocytes Relative: 7.7 % (ref 3.0–12.0)
Neutro Abs: 4.7 10*3/uL (ref 1.4–7.7)
Neutrophils Relative %: 56.1 % (ref 43.0–77.0)
Platelets: 133 10*3/uL — ABNORMAL LOW (ref 150.0–400.0)
RBC: 5.22 Mil/uL (ref 4.22–5.81)
RDW: 13 % (ref 11.5–14.6)
WBC: 8.4 10*3/uL (ref 4.5–10.5)

## 2013-11-02 LAB — BASIC METABOLIC PANEL
BUN: 14 mg/dL (ref 6–23)
CO2: 30 mEq/L (ref 19–32)
Calcium: 9.7 mg/dL (ref 8.4–10.5)
Chloride: 100 mEq/L (ref 96–112)
Creatinine, Ser: 0.9 mg/dL (ref 0.4–1.5)
GFR: 92.67 mL/min (ref >60.00)
Glucose, Bld: 162 mg/dL — ABNORMAL HIGH (ref 70–99)
Potassium: 4.3 mEq/L (ref 3.5–5.1)
Sodium: 137 mEq/L (ref 135–145)

## 2013-11-02 LAB — MAGNESIUM: Magnesium: 2 mg/dL (ref 1.5–2.5)

## 2013-11-02 NOTE — Patient Instructions (Signed)
Your physician wants you to follow-up in: 6 MONTHS WITH DR CRENSHAW You will receive a reminder letter in the mail two months in advance. If you don't receive a letter, please call our office to schedule the follow-up appointment.   Your physician recommends that you HAVE LAB WORK TODAY 

## 2013-11-02 NOTE — Progress Notes (Signed)
HPI: FU coronary artery disease status post stent to his LAD in December 2003 as well as paroxysmal atrial fibrillation. Admitted with frequent PVCs in July 2011. Noted to be bradycardic and beta blocker changed to p.r.n. TSH and enzymes were normal. Last echocardiogram in July 2011 showed normal LV function, mild biatrial enlargement and mild right ventricular enlargement. Last Myoview was in March 2013. There was no ischemia or infarction. The calculated ejection fraction was 51%. Saw Dr. Johney Frame previously and AFib ablation could be considered if patient lost weight. Patient had an episode of atrial fibrillation in June of 2013 and was cardioverted. Last seen in May 2014. Since that time, the patient has dyspnea with more extreme activities but not with routine activities. It is relieved with rest. It is not associated with chest pain. There is no orthopnea, PND or pedal edema. There is no syncope or palpitations. There is no exertional chest pain.    Current Outpatient Prescriptions  Medication Sig Dispense Refill  . atorvastatin (LIPITOR) 80 MG tablet Take 1 tablet (80 mg total) by mouth daily.  90 tablet  3  . b complex vitamins tablet Take 1 tablet by mouth daily.      . famotidine (PEPCID) 20 MG tablet Take 20 mg by mouth as needed.      . fish oil-omega-3 fatty acids 1000 MG capsule Take 2 g by mouth daily.        [name] MAGNESIUM OXIDE PO Take 750 mg by mouth daily.       . metFORMIN (GLUCOPHAGE-XR) 500 MG 24 hr tablet TAKE 1 TABLET BY MOUTH WITH BREAKFAST  90 tablet  3  . metFORMIN (GLUCOPHAGE-XR) 500 MG 24 hr tablet TAKE 1 TABLET BY MOUTH ONCE EVERY MORNING WITH BREAKFAST  93 tablet  0  . MULTIPLE VITAMIN PO Take 1 tablet by mouth daily.       . pantoprazole (PROTONIX) 40 MG tablet Take 1 tablet (40 mg total) by mouth daily.  90 tablet  3  . potassium chloride SA (KLOR-CON M20) 20 MEQ tablet TAKE 1 TABLET BY MOUTH ONCE DAILY  30 tablet  5  . PRADAXA 150 MG CAPS TAKE 1 CAPSULE BY MOUTH  EVERY 12 HOURS  60 capsule  4  . TIKOSYN 500 MCG capsule TAKE 1 CAPSULE (500 MCG TOTAL) BY MOUTH 2 (TWO) TIMES DAILY.  60 capsule  6  . zolpidem (AMBIEN) 10 MG tablet TAKE 1 TABLET BY MOUTH AT BEDTIME AS NEEDED  90 tablet  1  . nitroGLYCERIN (NITROSTAT) 0.4 MG SL tablet Place 0.4 mg under the tongue every 5 (five) minutes as needed.       No current facility-administered medications for this visit.     Past Medical History  Diagnosis Date  . HYPERLIPIDEMIA 03/22/2009  . Obesity, unspecified 04/24/2009  . THROMBOCYTOPENIA 08/16/2010  . ERECTILE DYSFUNCTION 03/22/2009  . SLEEP APNEA, OBSTRUCTIVE 03/22/2009    compliant with CPAP  . 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 Coronary atherosclerosis of native coronary artery 11/2002    a. s/p stent to LAD 12/03; OM2 occluded at cath 12/03; d. myoview 5/10: no ischemia;  e. echo 7/11: EF 55%, BAE, mild RVE, PASP 41-45; Myoview was in March 2013. There was no ischemia or infarction, EF 51%   . Atrial fibrillation 03/22/2009    a. s/p multiple DCCV; b. no coumadin due to low TE risk profile; c. Tikosyn Rx  . GERD 03/22/2009  . LATERAL EPICONDYLITIS, LEFT 10/24/2009  .  HYPERGLYCEMIA 04/25/2010  . LIVER FUNCTION TESTS, ABNORMAL 04/25/2010  . TOBACCO USE, QUIT 10/24/2009  . Umbilical hernia   . Myocardial infarction mi2003  . Local reaction to immunization 05/05/2012    minor resolving  zostavax   . Numbness in left leg     foot related to back disease and surgery    Past Surgical History  Procedure Laterality Date  . Cyst on epiglottis  08/2002  . Surgery l4-l5   1998    ruptured x 3  . Stent      LAD DUS 2004  . Ulnar neuropathy    . Umbilical hernia repair      mesh  . Lumbar disc surgery      two  holes in spinalcovering  . Esophagogastroduodenoscopy  11/15/2011    Procedure: ESOPHAGOGASTRODUODENOSCOPY (EGD);  Surgeon: Charna Elizabeth, MD;  Location: [facility];  Service: Endoscopy;  Laterality: N/A;  . Colonoscopy  11/15/2011    Procedure:  COLONOSCOPY;  Surgeon: Charna Elizabeth, MD;  Location: [facility];  Service: Endoscopy;  Laterality: N/A;    History   Social History  . Marital Status: Married    Spouse Name: N/A    Number of Children: N/A  . Years of Education: N/A   Occupational History  . Not on file.   Social History Main Topics  . Smoking status: Former Smoker -- 2.00 packs/day for 42 years    Types: Cigarettes    Quit date: 12/30/2008  . Smokeless tobacco: Never Used     Comment: started at age 10; 1-2 ppd; quit 2010  . Alcohol Use: No  . Drug Use: No  . Sexual Activity: Yes   Other Topics Concern  . Not on file   Social History Narrative   Retired paramedic   Regular exercise-yes not recently    Has children   Wife is overweight and has fibromyalgia and depression on disability doesn't go out much. Back surgery    Has older dog   Retired from [employer] 31 years of service now 7 years     ROS: no fevers or chills, productive cough, hemoptysis, dysphasia, odynophagia, melena, hematochezia, dysuria, hematuria, rash, seizure activity, orthopnea, PND, pedal edema, claudication. Remaining systems are negative.  Physical Exam: Well-developed obese in no acute distress.  Skin is warm and dry.  HEENT is normal.  Neck is supple.  Chest is clear to auscultation with normal expansion.  Cardiovascular exam is regular rate and rhythm.  Abdominal exam nontender or distended. No masses palpated. Extremities show trace edema. neuro grossly intact  ECG sinus bradycardia at a rate of 55. Right axis deviation.

## 2013-11-02 NOTE — Assessment & Plan Note (Signed)
Patient remains in sinus rhythm. Continue tikosyn. Check K and Mg. Continue pradaxa. Check hemoglobin and renal function.

## 2013-11-02 NOTE — Assessment & Plan Note (Signed)
Continue statin. Not on aspirin given need for anticoagulation. 

## 2013-11-02 NOTE — Assessment & Plan Note (Signed)
Continue statin. 

## 2013-11-18 ENCOUNTER — Other Ambulatory Visit: Payer: Self-pay | Admitting: Internal Medicine

## 2013-11-24 ENCOUNTER — Other Ambulatory Visit: Payer: Self-pay | Admitting: Cardiology

## 2013-11-26 ENCOUNTER — Other Ambulatory Visit: Payer: Self-pay | Admitting: Internal Medicine

## 2013-11-26 ENCOUNTER — Other Ambulatory Visit: Payer: Self-pay | Admitting: Cardiology

## 2013-12-07 ENCOUNTER — Other Ambulatory Visit (INDEPENDENT_AMBULATORY_CARE_PROVIDER_SITE_OTHER): Payer: 59

## 2013-12-07 DIAGNOSIS — E1110 Type 2 diabetes mellitus with ketoacidosis without coma: Secondary | ICD-10-CM

## 2013-12-07 DIAGNOSIS — E1310 Other specified diabetes mellitus with ketoacidosis without coma: Secondary | ICD-10-CM

## 2013-12-07 LAB — HEMOGLOBIN A1C: Hgb A1c MFr Bld: 7.3 % — ABNORMAL HIGH (ref 4.6–6.5)

## 2013-12-14 ENCOUNTER — Ambulatory Visit: Payer: 59 | Admitting: Internal Medicine

## 2013-12-21 ENCOUNTER — Encounter: Payer: Self-pay | Admitting: Internal Medicine

## 2013-12-21 ENCOUNTER — Ambulatory Visit (INDEPENDENT_AMBULATORY_CARE_PROVIDER_SITE_OTHER): Payer: 59 | Admitting: Internal Medicine

## 2013-12-21 VITALS — BP 118/66 | HR 62 | Temp 98.6°F | Wt 345.3 lb

## 2013-12-21 DIAGNOSIS — Z79899 Other long term (current) drug therapy: Secondary | ICD-10-CM

## 2013-12-21 DIAGNOSIS — Z7901 Long term (current) use of anticoagulants: Secondary | ICD-10-CM

## 2013-12-21 DIAGNOSIS — E119 Type 2 diabetes mellitus without complications: Secondary | ICD-10-CM

## 2013-12-21 LAB — GLUCOSE, POCT (MANUAL RESULT ENTRY): POC Glucose: 137 mg/dL — AB (ref 70–99)

## 2013-12-21 NOTE — Progress Notes (Signed)
Chief Complaint  Patient presents with  . Follow-up    HPI: Patient comes in today for follow up of  multiple medical problems.  Fu cards stabel on pradaxa stay on statin no change. DM n lows hasn't been eating as well with the holidays and birthdays. No other concerns just getting old shoulder her when he was pushing off can't use anti-inflammatories just using Tylenol. No unusual bleeding.  ROS: See pertinent positives and negatives per HPI.  Past Medical History  Diagnosis Date  . HYPERLIPIDEMIA 03/22/2009  . Obesity, unspecified 04/24/2009  . THROMBOCYTOPENIA 08/16/2010  . ERECTILE DYSFUNCTION 03/22/2009  . SLEEP APNEA, OBSTRUCTIVE 03/22/2009    compliant with CPAP  . 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 Coronary atherosclerosis of native coronary artery 11/2002    a. s/p stent to LAD 12/03; OM2 occluded at cath 12/03; d. myoview 5/10: no ischemia;  e. echo 7/11: EF 55%, BAE, mild RVE, PASP 41-45; Myoview was in March 2013. There was no ischemia or infarction, EF 51%   . Atrial fibrillation 03/22/2009    a. s/p multiple DCCV; b. no coumadin due to low TE risk profile; c. Tikosyn Rx  . GERD 03/22/2009  . LATERAL EPICONDYLITIS, LEFT 10/24/2009  . HYPERGLYCEMIA 04/25/2010  . LIVER FUNCTION TESTS, ABNORMAL 04/25/2010  . TOBACCO USE, QUIT 10/24/2009  . Umbilical hernia   . Myocardial infarction mi2003  . Local reaction to immunization 05/05/2012    minor resolving  zostavax   . Numbness in left leg     foot related to back disease and surgery    Family History  Problem Relation Age of Onset  . Thyroid disease Mother   . Ovarian cancer Mother   . Breast cancer Mother   . Lung cancer Father   . Cancer Father     History   Social History  . Marital Status: Married    Spouse Name: N/A    Number of Children: N/A  . Years of Education: N/A   Social History Main Topics  . Smoking status: Former Smoker -- 2.00 packs/day for 42 years    Types: Cigarettes    Quit date: 12/30/2008   . Smokeless tobacco: Never Used     Comment: started at age 32; 1-2 ppd; quit 2010  . Alcohol Use: No  . Drug Use: No  . Sexual Activity: Yes   Other Topics Concern  . None   Social History Narrative   Retired paramedic   Regular exercise-yes not recently    Has children   Wife is overweight and has fibromyalgia and depression on disability doesn't go out much. Back surgery    Has older dog   Retired from [employer] 31 years of service now 7 years     Outpatient Encounter Prescriptions as of 12/21/2013  Medication Sig  . atorvastatin (LIPITOR) 80 MG tablet Take 1 tablet by mouth  daily  . famotidine (PEPCID) 20 MG tablet Take 20 mg by mouth as needed.  . fish oil-omega-3 fatty acids 1000 MG capsule Take 2 g by mouth daily.    [name] KLOR-CON M20 20 MEQ tablet TAKE 1 TABLET BY MOUTH ONCE DAILY  . MAGNESIUM OXIDE PO Take 750 mg by mouth daily.   . metFORMIN (GLUCOPHAGE-XR) 500 MG 24 hr tablet TAKE 1 TABLET BY MOUTH WITH BREAKFAST  . metFORMIN (GLUCOPHAGE-XR) 500 MG 24 hr tablet TAKE 1 TABLET BY MOUTH ONCE EVERY MORNING WITH BREAKFAST  . MULTIPLE VITAMIN PO Take 1 tablet by mouth daily.   [name]  nitroGLYCERIN (NITROSTAT) 0.4 MG SL tablet Place 0.4 mg under the tongue every 5 (five) minutes as needed.  . pantoprazole (PROTONIX) 40 MG tablet Take 1 tablet (40 mg total) by mouth daily.  [name] PRADAXA 150 MG CAPS TAKE 1 CAPSULE BY MOUTH EVERY 12 HOURS  . TIKOSYN 500 MCG capsule TAKE 1 CAPSULE (500 MCG TOTAL) BY MOUTH 2 (TWO) TIMES DAILY.  [name] zolpidem (AMBIEN) 10 MG tablet TAKE 1 TABLET BY MOUTH AT BEDTIME AS NEEDED  . [DISCONTINUED] b complex vitamins tablet Take 1 tablet by mouth daily.    EXAM:  BP 118/66  Pulse 62  Temp(Src) 98.6 F (37 C) (Oral)  Wt 345 lb 4.8 oz (156.627 kg)  SpO2 96%  Body mass index is 46.82 kg/(m^2).  GENERAL: vitals reviewed and listed above, alert, oriented, appears well hydrated and in no acute distress  Record review PSYCH: pleasant and cooperative, no obvious  depression or anxiety Lab Results  Component Value Date   WBC 8.4 11/02/2013   HGB 16.5 11/02/2013   HCT 47.5 11/02/2013   PLT 133.0* 11/02/2013   GLUCOSE 162* 11/02/2013   CHOL 98 06/11/2013   TRIG 184.0* 06/11/2013   HDL 25.20* 06/11/2013   LDLCALC 36 06/11/2013   ALT 57* 06/11/2013   AST 32 06/11/2013   NA 137 11/02/2013   K 4.3 11/02/2013   CL 100 11/02/2013   CREATININE 0.9 11/02/2013   BUN 14 11/02/2013   CO2 30 11/02/2013   TSH 2.01 06/11/2013   PSA 0.81 06/11/2013   INR 1.03 05/18/2011   HGBA1C 7.3* 12/07/2013   MICROALBUR 4.5* 06/11/2013    ASSESSMENT AND PLAN:  Discussed the following assessment and plan:  Check with insurance about which machine and strips they will cover will reck prescription for this discussed monitoring  he feels comfortable being able to 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 Discussed metformin other options. -Patient advised to return or notify health care team  if symptoms worsen or persist or new concerns arise. Disc weight loss programs and cautions  Ok if life style related and no injextions supplements etc .  Check out with cards also  Total visit > 50% spent counseling and coordinating care    Patient Instructions  Intensify lifestyle interventions. Advise  Getting glucometer machine and monitor  Fasting bg   For 2 weeks and then occassionally  2 hours after eating  3 x per week. Goal fasting below 120 Goal 2 hours after eating  Below 180  Acceptable but best 140- 150 and below  Hg a1c in 3 months and then ROV . consider increasing metformin dose .     Neta Mends. Panosh M.D.  Pre visit review using our clinic review tool, if applicable. No additional management support is needed unless otherwise documented below in the visit note.

## 2013-12-21 NOTE — Patient Instructions (Signed)
Intensify lifestyle interventions. Advise  Getting glucometer machine and monitor  Fasting bg   For 2 weeks and then occassionally  2 hours after eating  3 x per week. Goal fasting below 120 Goal 2 hours after eating  Below 180  Acceptable but best 140- 150 and below  Hg a1c in 3 months and then ROV . consider increasing metformin dose .

## 2014-01-17 ENCOUNTER — Other Ambulatory Visit: Payer: Self-pay | Admitting: Internal Medicine

## 2014-01-20 ENCOUNTER — Encounter: Payer: Self-pay | Admitting: Pulmonary Disease

## 2014-01-20 ENCOUNTER — Ambulatory Visit (INDEPENDENT_AMBULATORY_CARE_PROVIDER_SITE_OTHER): Payer: 59 | Admitting: Pulmonary Disease

## 2014-01-20 VITALS — BP 126/72 | HR 72 | Temp 98.0°F | Ht 74.0 in | Wt 348.0 lb

## 2014-01-20 DIAGNOSIS — G4733 Obstructive sleep apnea (adult) (pediatric): Secondary | ICD-10-CM

## 2014-01-20 NOTE — Patient Instructions (Signed)
Continue with cpap, and keep up with mask changes and supplies. Work on weight reduction followup with me in one year.

## 2014-01-20 NOTE — Progress Notes (Signed)
   Subjective:    Patient ID: [name] [name], male    DOB: [birth year], 63 y.o.   MRN: [id number]  HPI The patient comes in today for followup of his obstructive sleep apnea. He is wearing CPAP compliantly, and is having no issues with his mask fit or pressure. He sleeps well at night, and has excellent daytime alertness. His weight is stable since the last visit.   Review of Systems  Constitutional: Negative for fever and unexpected weight change.  HENT: Negative for congestion, dental problem, ear pain, nosebleeds, postnasal drip, rhinorrhea, sinus pressure, sneezing, sore throat and trouble swallowing.   Eyes: Negative for redness and itching.  Respiratory: Negative for cough, chest tightness, shortness of breath and wheezing.   Cardiovascular: Negative for palpitations and leg swelling.  Gastrointestinal: Negative for nausea and vomiting.  Genitourinary: Negative for dysuria.  Musculoskeletal: Negative for joint swelling.  Skin: Negative for rash.  Neurological: Negative for headaches.  Hematological: Does not bruise/bleed easily.  Psychiatric/Behavioral: Negative for dysphoric mood. The patient is not nervous/anxious.        Objective:   Physical Exam Obese male in no acute distress Nose without purulence or discharge noted No skin breakdown or pressure necrosis from the CPAP mask Neck without lymphadenopathy or thyromegaly Lower extremities with minimal edema, no cyanosis Alert and oriented, moves all 4 extremities.       Assessment & Plan:

## 2014-01-20 NOTE — Assessment & Plan Note (Signed)
The patient is doing well from a sleep apnea standpoint on his current CPAP. I've encouraged him to keep up with his mask changes and supplies, and to work aggressively on weight loss. I will see him back in one year if he is doing well.

## 2014-02-15 ENCOUNTER — Other Ambulatory Visit: Payer: Self-pay | Admitting: Internal Medicine

## 2014-02-23 ENCOUNTER — Other Ambulatory Visit: Payer: Self-pay | Admitting: Internal Medicine

## 2014-02-23 NOTE — Telephone Encounter (Signed)
Last filled on 02/24/13 #90 with 1 additional refills Has a future appt on 03/22/14 Last seen on 12/21/13 Please advise. Thanks!

## 2014-02-23 NOTE — Telephone Encounter (Signed)
Ok to refill 6 months of all medication

## 2014-03-14 ENCOUNTER — Other Ambulatory Visit: Payer: Self-pay | Admitting: Internal Medicine

## 2014-03-15 ENCOUNTER — Other Ambulatory Visit: Payer: Self-pay

## 2014-03-15 MED ORDER — DOFETILIDE 500 MCG PO CAPS: ORAL_CAPSULE | ORAL | Status: DC

## 2014-03-16 ENCOUNTER — Other Ambulatory Visit (INDEPENDENT_AMBULATORY_CARE_PROVIDER_SITE_OTHER): Payer: 59

## 2014-03-16 DIAGNOSIS — E1165 Type 2 diabetes mellitus with hyperglycemia: Principal | ICD-10-CM

## 2014-03-16 DIAGNOSIS — IMO0001 Reserved for inherently not codable concepts without codable children: Secondary | ICD-10-CM

## 2014-03-16 LAB — HEMOGLOBIN A1C: Hgb A1c MFr Bld: 7.9 % — ABNORMAL HIGH (ref 4.6–6.5)

## 2014-03-16 NOTE — Telephone Encounter (Signed)
Robin at [facility] called to check on refill of Pradaxa

## 2014-03-17 NOTE — Telephone Encounter (Signed)
Ok to refill x 6 months 

## 2014-03-22 ENCOUNTER — Encounter: Payer: Self-pay | Admitting: Internal Medicine

## 2014-03-22 ENCOUNTER — Ambulatory Visit (INDEPENDENT_AMBULATORY_CARE_PROVIDER_SITE_OTHER): Payer: 59 | Admitting: Internal Medicine

## 2014-03-22 VITALS — BP 144/72 | Temp 99.3°F | Ht 72.0 in | Wt 347.0 lb

## 2014-03-22 DIAGNOSIS — IMO0001 Reserved for inherently not codable concepts without codable children: Secondary | ICD-10-CM

## 2014-03-22 DIAGNOSIS — E1165 Type 2 diabetes mellitus with hyperglycemia: Secondary | ICD-10-CM

## 2014-03-22 DIAGNOSIS — Z7901 Long term (current) use of anticoagulants: Secondary | ICD-10-CM

## 2014-03-22 DIAGNOSIS — E119 Type 2 diabetes mellitus without complications: Secondary | ICD-10-CM

## 2014-03-22 MED ORDER — METFORMIN HCL ER 500 MG PO TB24: 500.0000 mg | ORAL_TABLET | Freq: Two times a day (BID) | ORAL | Status: DC

## 2014-03-22 NOTE — Patient Instructions (Addendum)
preventive visit in June July   With full set of labs and hg a1c pre visit   Patient Instructions  Intensify lifestyle interventions.  Getting glucometer machine and monitor  Fasting bg   For 2 weeks and then occassionally  2 hours after eating  3 x per week. Goal fasting below 120 Goal 2 hours after eating  Below 180  Acceptable but best 140- 150 and below  Hg a1c in 3 months and then ROV .  increasing metformin dose . To 2 per day . Wt Readings from Last 3 Encounters:  03/22/14 347 lb (157.398 kg)  01/20/14 348 lb (157.852 kg)  12/21/13 345 lb 4.8 oz (156.627 kg)

## 2014-03-22 NOTE — Progress Notes (Signed)
Chief Complaint  Patient presents with  . Follow-up  . Diabetes    HPI: Patient comes in today for follow up of  multiple medical problems.  Had not been on full dose of metformin for 12 days.   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 Eating quite as well recently has yet to find out about his own glucometer to check his blood sugar Feels fine  so far atrial fib is stable no other changes in his health. He cooks for his wife who is disabled and likes different things that are not healthy for him.  ROS: See pertinent positives and negatives per HPI. No falling bleeding new numbness.  Past Medical History  Diagnosis Date  . HYPERLIPIDEMIA 03/22/2009  . Obesity, unspecified 04/24/2009  . THROMBOCYTOPENIA 08/16/2010  . ERECTILE DYSFUNCTION 03/22/2009  . SLEEP APNEA, OBSTRUCTIVE 03/22/2009    compliant with CPAP  . 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 Coronary atherosclerosis of native coronary artery 11/2002    a. s/p stent to LAD 12/03; OM2 occluded at cath 12/03; d. myoview 5/10: no ischemia;  e. echo 7/11: EF 55%, BAE, mild RVE, PASP 41-45; Myoview was in March 2013. There was no ischemia or infarction, EF 51%   . Atrial fibrillation 03/22/2009    a. s/p multiple DCCV; b. no coumadin due to low TE risk profile; c. Tikosyn Rx  . GERD 03/22/2009  . LATERAL EPICONDYLITIS, LEFT 10/24/2009  . HYPERGLYCEMIA 04/25/2010  . LIVER FUNCTION TESTS, ABNORMAL 04/25/2010  . TOBACCO USE, QUIT 10/24/2009  . Umbilical hernia   . Myocardial infarction mi2003  . Local reaction to immunization 05/05/2012    minor resolving  zostavax   . Numbness in left leg     foot related to back disease and surgery    Family History  Problem Relation Age of Onset  . Thyroid disease Mother   . Ovarian cancer Mother   . Breast cancer Mother   . Lung cancer Father   . Cancer Father     History   Social History  . Marital Status: Married    Spouse Name: N/A    Number of Children: N/A    . Years of Education: N/A   Social History Main Topics  . Smoking status: Former Smoker -- 2.00 packs/day for 42 years    Types: Cigarettes    Quit date: 12/30/2008  . Smokeless tobacco: Never Used     Comment: started at age 60; 1-2 ppd; quit 2010  . Alcohol Use: No  . Drug Use: No  . Sexual Activity: Yes   Other Topics Concern  . None   Social History Narrative   Retired paramedic   Regular exercise-yes not recently    Has children   Wife is overweight and has fibromyalgia and depression on disability doesn't go out much. Back surgery    Has older dog   Retired from [employer] 31 years of service now 7 years     Outpatient Encounter Prescriptions as of 03/22/2014  Medication Sig  . atorvastatin (LIPITOR) 80 MG tablet Take 1 tablet by mouth  daily  . dofetilide (TIKOSYN) 500 MCG capsule TAKE 1 CAPSULE (500 MCG TOTAL) BY MOUTH 2 (TWO) TIMES DAILY.  . famotidine (PEPCID) 20 MG tablet Take 20 mg by mouth as needed.  . fish oil-omega-3 fatty acids 1000 MG capsule Take 2 g by mouth daily.    [name] KLOR-CON M20 20 MEQ tablet TAKE 1 TABLET BY  MOUTH ONCE DAILY  . MAGNESIUM OXIDE PO Take 750 mg by mouth daily.   . metFORMIN (GLUCOPHAGE-XR) 500 MG 24 hr tablet Take 1 tablet (500 mg total) by mouth 2 (two) times daily.  . MULTIPLE VITAMIN PO Take 1 tablet by mouth daily.   . pantoprazole (PROTONIX) 40 MG tablet TAKE 1 TABLET BY MOUTH EVERY DAY  . PRADAXA 150 MG CAPS capsule TAKE 1 CAPSULE BY MOUTH EVERY 12 HOURS  . zolpidem (AMBIEN) 10 MG tablet TAKE 1 TABLET BY MOUTH AT BEDTIME AS NEEDED  . [DISCONTINUED] metFORMIN (GLUCOPHAGE-XR) 500 MG 24 hr tablet TAKE 1 TABLET BY MOUTH ONCE EVERY MORNING WITH BREAKFAST  . nitroGLYCERIN (NITROSTAT) 0.4 MG SL tablet Place 0.4 mg under the tongue every 5 (five) minutes as needed.  . [DISCONTINUED] metFORMIN (GLUCOPHAGE-XR) 500 MG 24 hr tablet TAKE 1 TABLET BY MOUTH WITH BREAKFAST  . [DISCONTINUED] metFORMIN (GLUCOPHAGE-XR) 500 MG 24 hr tablet TAKE 1 TABLET BY  MOUTH ONCE EVERY MORNING WITH BREAKFAST  . [DISCONTINUED] PRADAXA 150 MG CAPS TAKE 1 CAPSULE BY MOUTH EVERY 12 HOURS    EXAM:  BP 144/72  Temp(Src) 99.3 F (37.4 C) (Oral)  Ht 6' (1.829 m)  Wt 347 lb (157.398 kg)  BMI 47.05 kg/m2  Body mass index is 47.05 kg/(m^2).  GENERAL: vitals reviewed and listed above, alert, oriented, appears well hydrated and in no acute distress MS: moves all extremities without noticeable focal  abnormality PSYCH: pleasant and cooperative, no obvious depression or anxiety Lab Results  Component Value Date   WBC 8.4 11/02/2013   HGB 16.5 11/02/2013   HCT 47.5 11/02/2013   PLT 133.0* 11/02/2013   GLUCOSE 162* 11/02/2013   CHOL 98 06/11/2013   TRIG 184.0* 06/11/2013   HDL 25.20* 06/11/2013   LDLCALC 36 06/11/2013   ALT 57* 06/11/2013   AST 32 06/11/2013   NA 137 11/02/2013   K 4.3 11/02/2013   CL 100 11/02/2013   CREATININE 0.9 11/02/2013   BUN 14 11/02/2013   CO2 30 11/02/2013   TSH 2.01 06/11/2013   PSA 0.81 06/11/2013   INR 1.03 05/18/2011   HGBA1C 7.9* 03/16/2014   MICROALBUR 4.5* 06/11/2013    ASSESSMENT AND PLAN:  Discussed the following assessment and plan:  Diabetes mellitus, type 2  Type II or unspecified type diabetes mellitus without mention of complication, uncontrolled  Chronic anticoagulation Discussed many options increase the metformin to 2 a day or thousand milligrams intensify lifestyle interventions prescription given for generic a glucometer with strips and lancets so this can move forward he will call us if we need a specific prescription written reviewed goals. Plan. Lab work and an A1c in the summer before his preventive visit. -Patient advised to return or notify health care team  if  new concerns arise. Or unableto follow through with the plan Total visit 57mins > 50% spent counseling and coordinating care     Patient Instructions   preventive visit in June July   With full set of labs and hg a1c pre visit   Patient  Instructions  Intensify lifestyle interventions.  Getting glucometer machine and monitor  Fasting bg   For 2 weeks and then occassionally  2 hours after eating  3 x per week. Goal fasting below 120 Goal 2 hours after eating  Below 180  Acceptable but best 140- 150 and below  Hg a1c in 3 months and then ROV .  increasing metformin dose . To 2 per day . Wt Readings from  Last 3 Encounters:  03/22/14 347 lb (157.398 kg)  01/20/14 348 lb (157.852 kg)  12/21/13 345 lb 4.8 oz (156.627 kg)        Standley Brooking. Ander Wamser M.D.  Pre visit review using our clinic review tool, if applicable. No additional management support is needed unless otherwise documented below in the visit note.

## 2014-03-29 ENCOUNTER — Telehealth: Payer: Self-pay

## 2014-03-29 NOTE — Telephone Encounter (Signed)
Relevant patient education assigned to patient using Emmi. ° °

## 2014-04-11 ENCOUNTER — Other Ambulatory Visit: Payer: Self-pay | Admitting: Internal Medicine

## 2014-05-09 ENCOUNTER — Other Ambulatory Visit: Payer: Self-pay | Admitting: *Deleted

## 2014-05-09 MED ORDER — POTASSIUM CHLORIDE CRYS ER 20 MEQ PO TBCR: EXTENDED_RELEASE_TABLET | ORAL | Status: DC

## 2014-05-10 ENCOUNTER — Other Ambulatory Visit: Payer: Self-pay

## 2014-05-10 MED ORDER — POTASSIUM CHLORIDE CRYS ER 20 MEQ PO TBCR: EXTENDED_RELEASE_TABLET | ORAL | Status: DC

## 2014-05-20 ENCOUNTER — Ambulatory Visit (INDEPENDENT_AMBULATORY_CARE_PROVIDER_SITE_OTHER): Payer: 59 | Admitting: Physician Assistant

## 2014-05-20 ENCOUNTER — Encounter: Payer: Self-pay | Admitting: Physician Assistant

## 2014-05-20 VITALS — BP 111/80 | HR 64 | Ht 72.0 in | Wt 331.0 lb

## 2014-05-20 DIAGNOSIS — E785 Hyperlipidemia, unspecified: Secondary | ICD-10-CM

## 2014-05-20 DIAGNOSIS — I4891 Unspecified atrial fibrillation: Secondary | ICD-10-CM

## 2014-05-20 DIAGNOSIS — I2510 Atherosclerotic heart disease of native coronary artery without angina pectoris: Secondary | ICD-10-CM

## 2014-05-20 MED ORDER — NITROGLYCERIN 0.4 MG SL SUBL: 0.4000 mg | SUBLINGUAL_TABLET | SUBLINGUAL | Status: DC | PRN

## 2014-05-20 NOTE — Progress Notes (Signed)
Cardiology Office Note   Date:  05/20/2014   ID:  [name] [name], DOB 1951/11/23, MRN [id number]  PCP:  Lottie Dawson, MD  Cardiologist:  Dr. [name]   Electrophysiologist:  Dr. Thompson Grayer    History of Present Illness: [name] [name] is a 63 y.o. male with a hx of CAD s/p stent to LAD in 2003, parox AFib, HL, OSA.  He underwent DCCV in 05/2012 for AFib.  He has seen Dr. Thompson Grayer and ablation could be considered if he lost weight.  He has maintained NSR on Tikosyn.  Last seen by Dr. [name] in 10/2013.  He returns for follow up.  He denies palpitations.  The patient denies chest pain, shortness of breath, syncope, orthopnea, PND or significant pedal edema.    Studies:  - Echo (06/2010):  EF 55%, mild dilated aortic root, MAC, mild LAE, mild RVE, mild RAE, PASP 41-45 mmHg  - Nuclear (03/2012):  No ischemia, EF 51%; Normal   Recent Labs: 06/11/2013: ALT 57*; HDL Cholesterol by NMR 25.20*; LDL (calc) 36; TSH 2.01  11/02/2013: Creatinine 0.9; Hemoglobin 16.5; Potassium 4.3   Wt Readings from Last 3 Encounters:  05/20/14 331 lb (150.141 kg)  03/22/14 347 lb (157.398 kg)  01/20/14 348 lb (157.852 kg)     Past Medical History  Diagnosis Date  . HYPERLIPIDEMIA 03/22/2009  . Obesity, unspecified 04/24/2009  . THROMBOCYTOPENIA 08/16/2010  . ERECTILE DYSFUNCTION 03/22/2009  . SLEEP APNEA, OBSTRUCTIVE 03/22/2009    compliant with CPAP  . 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 Coronary atherosclerosis of native coronary artery 11/2002    a. s/p stent to LAD 12/03; OM2 occluded at cath 12/03; d. myoview 5/10: no ischemia;  e. echo 7/11: EF 55%, BAE, mild RVE, PASP 41-45; Myoview was in March 2013. There was no ischemia or infarction, EF 51%   . Atrial fibrillation 03/22/2009    a. s/p multiple DCCV; b. no coumadin due to low TE risk profile; c. Tikosyn Rx  . GERD 03/22/2009  . LATERAL EPICONDYLITIS, LEFT 10/24/2009  . HYPERGLYCEMIA 04/25/2010  . LIVER FUNCTION TESTS,  ABNORMAL 04/25/2010  . TOBACCO USE, QUIT 10/24/2009  . Umbilical hernia   . Myocardial infarction mi2003  . Local reaction to immunization 05/05/2012    minor resolving  zostavax   . Numbness in left leg     foot related to back disease and surgery    Current Outpatient Prescriptions  Medication Sig Dispense Refill  . atorvastatin (LIPITOR) 80 MG tablet Take 1 tablet by mouth  daily  90 tablet  0  . dofetilide (TIKOSYN) 500 MCG capsule TAKE 1 CAPSULE (500 MCG TOTAL) BY MOUTH 2 (TWO) TIMES DAILY.  60 capsule  6  . famotidine (PEPCID) 20 MG tablet Take 20 mg by mouth as needed.      . fish oil-omega-3 fatty acids 1000 MG capsule Take 2 g by mouth daily.        [name] MAGNESIUM OXIDE PO Take 750 mg by mouth daily.       . metFORMIN (GLUCOPHAGE-XR) 500 MG 24 hr tablet Take 1 tablet (500 mg total) by mouth 2 (two) times daily.  180 tablet  3  . MULTIPLE VITAMIN PO Take 1 tablet by mouth daily.       . nitroGLYCERIN (NITROSTAT) 0.4 MG SL tablet Place 0.4 mg under the tongue every 5 (five) minutes as needed.      . pantoprazole (PROTONIX) 40 MG tablet TAKE 1 TABLET BY MOUTH  EVERY DAY  90 tablet  1  . potassium chloride SA (KLOR-CON M20) 20 MEQ tablet TAKE 1 TABLET BY MOUTH ONCE DAILY  30 tablet  3  . PRADAXA 150 MG CAPS capsule TAKE 1 CAPSULE BY MOUTH EVERY 12 HOURS  60 capsule  5  . zolpidem (AMBIEN) 10 MG tablet TAKE 1 TABLET BY MOUTH AT BEDTIME AS NEEDED  90 tablet  1   No current facility-administered medications for this visit.    Allergies:   Penicillins; Procaine hcl; Pseudoephedrine; and Sulfonamide derivatives   Social History:  The patient  reports that he quit smoking about 5 years ago. His smoking use included Cigarettes. He has a 84 pack-year smoking history. He has never used smokeless tobacco. He reports that he does not drink alcohol or use illicit drugs.   Family History:  The patient's family history includes Breast cancer in his mother; Cancer in his father; Lung cancer in his  father; Ovarian cancer in his mother; Thyroid disease in his mother.   ROS:  Please see the history of present illness.   No bleeding problems.   All other systems reviewed and negative.   PHYSICAL EXAM: VS:  BP 111/80  Pulse 64  Ht 6' (1.829 m)  Wt 331 lb (150.141 kg)  BMI 44.88 kg/m2 Well nourished, well developed, in no acute distress HEENT: normal Neck: I cannot assess JVD Cardiac:  normal S1, S2; RRR; no murmur Lungs:  clear to auscultation bilaterally, no wheezing, rhonchi or rales Abd: soft, nontender, no hepatomegaly Ext: trace bilateral LE edema Skin: warm and dry Neuro:  CNs 2-12 intact, no focal abnormalities noted  EKG:  NSR, HR 64, normal axis, no ST changes, QTc 433     ASSESSMENT AND PLAN:  1. Coronary Artery Disease s/p Remote PCI: No angina. He is not on aspirin as he is on Pradaxa. Continue statin. 2. Atrial fibrillation: He is maintaining NSR. Continue Tikosyn and Pradaxa. Arrange followup basic metabolic panel, magnesium and CBC with labs in 06/2014 (scheduled for physical exam). 3. Other and unspecified hyperlipidemia: Continue statin. 4. Disposition: Follow up with Dr. Stanford Breed in 6 months   Signed, [name], MHS 05/20/2014 10:12 AM    [name], [place], [place]  [address] Phone: [phone]; Fax: [phone]

## 2014-05-20 NOTE — Patient Instructions (Signed)
Your physician recommends that you continue on your current medications as directed. Please refer to the Current Medication list given to you today.  Your physician recommends that have your PCP draw these labs at physical: BMET, CBC, Magnesium  Your physician wants you to follow-up in: 6 months with Dr. Stanford Breed. You will receive a reminder letter in the mail two months in advance. If you don't receive a letter, please call our office to schedule the follow-up appointment.

## 2014-06-04 ENCOUNTER — Inpatient Hospital Stay (HOSPITAL_COMMUNITY)
Admission: EM | Admit: 2014-06-04 | Discharge: 2014-06-13 | DRG: 339 | Disposition: A | Discharge status: Home Health | Payer: 59 | Attending: General Surgery | Admitting: General Surgery

## 2014-06-04 ENCOUNTER — Emergency Department (HOSPITAL_COMMUNITY): Payer: 59

## 2014-06-04 ENCOUNTER — Encounter (HOSPITAL_COMMUNITY): Payer: Self-pay | Admitting: Emergency Medicine

## 2014-06-04 DIAGNOSIS — Z9861 Coronary angioplasty status: Secondary | ICD-10-CM

## 2014-06-04 DIAGNOSIS — Z5181 Encounter for therapeutic drug level monitoring: Secondary | ICD-10-CM

## 2014-06-04 DIAGNOSIS — I2510 Atherosclerotic heart disease of native coronary artery without angina pectoris: Secondary | ICD-10-CM

## 2014-06-04 DIAGNOSIS — Z87891 Personal history of nicotine dependence: Secondary | ICD-10-CM

## 2014-06-04 DIAGNOSIS — Z801 Family history of malignant neoplasm of trachea, bronchus and lung: Secondary | ICD-10-CM

## 2014-06-04 DIAGNOSIS — K352 Acute appendicitis with generalized peritonitis, without abscess: Secondary | ICD-10-CM

## 2014-06-04 DIAGNOSIS — K8020 Calculus of gallbladder without cholecystitis without obstruction: Secondary | ICD-10-CM | POA: Diagnosis present

## 2014-06-04 DIAGNOSIS — I252 Old myocardial infarction: Secondary | ICD-10-CM

## 2014-06-04 DIAGNOSIS — E785 Hyperlipidemia, unspecified: Secondary | ICD-10-CM | POA: Diagnosis present

## 2014-06-04 DIAGNOSIS — I959 Hypotension, unspecified: Secondary | ICD-10-CM

## 2014-06-04 DIAGNOSIS — Z880 Allergy status to penicillin: Secondary | ICD-10-CM

## 2014-06-04 DIAGNOSIS — Z5331 Laparoscopic surgical procedure converted to open procedure: Secondary | ICD-10-CM

## 2014-06-04 DIAGNOSIS — K37 Unspecified appendicitis: Secondary | ICD-10-CM

## 2014-06-04 DIAGNOSIS — N179 Acute kidney failure, unspecified: Secondary | ICD-10-CM | POA: Diagnosis present

## 2014-06-04 DIAGNOSIS — D696 Thrombocytopenia, unspecified: Secondary | ICD-10-CM | POA: Diagnosis present

## 2014-06-04 DIAGNOSIS — Z8041 Family history of malignant neoplasm of ovary: Secondary | ICD-10-CM

## 2014-06-04 DIAGNOSIS — J438 Other emphysema: Secondary | ICD-10-CM | POA: Diagnosis present

## 2014-06-04 DIAGNOSIS — Z803 Family history of malignant neoplasm of breast: Secondary | ICD-10-CM

## 2014-06-04 DIAGNOSIS — I4891 Unspecified atrial fibrillation: Secondary | ICD-10-CM | POA: Diagnosis present

## 2014-06-04 DIAGNOSIS — K219 Gastro-esophageal reflux disease without esophagitis: Secondary | ICD-10-CM | POA: Diagnosis present

## 2014-06-04 DIAGNOSIS — E119 Type 2 diabetes mellitus without complications: Secondary | ICD-10-CM | POA: Diagnosis present

## 2014-06-04 DIAGNOSIS — G4733 Obstructive sleep apnea (adult) (pediatric): Secondary | ICD-10-CM | POA: Diagnosis present

## 2014-06-04 DIAGNOSIS — Z7901 Long term (current) use of anticoagulants: Secondary | ICD-10-CM

## 2014-06-04 DIAGNOSIS — K35209 Acute appendicitis with generalized peritonitis, without abscess, unspecified as to perforation: Principal | ICD-10-CM | POA: Diagnosis present

## 2014-06-04 DIAGNOSIS — I723 Aneurysm of iliac artery: Secondary | ICD-10-CM | POA: Diagnosis present

## 2014-06-04 DIAGNOSIS — Z01810 Encounter for preprocedural cardiovascular examination: Secondary | ICD-10-CM

## 2014-06-04 DIAGNOSIS — K560 Paralytic ileus: Secondary | ICD-10-CM | POA: Diagnosis not present

## 2014-06-04 HISTORY — DX: Type 2 diabetes mellitus without complications: E11.9

## 2014-06-04 HISTORY — DX: Unspecified appendicitis: K37

## 2014-06-04 LAB — CBC WITH DIFFERENTIAL/PLATELET
Basophils Absolute: 0 10*3/uL (ref 0.0–0.1)
Basophils Relative: 0 % (ref 0–1)
Eosinophils Absolute: 0.5 10*3/uL (ref 0.0–0.7)
Eosinophils Relative: 5 % (ref 0–5)
HCT: 47 % (ref 39.0–52.0)
Hemoglobin: 16.5 g/dL (ref 13.0–17.0)
Lymphocytes Relative: 18 % (ref 12–46)
Lymphs Abs: 1.8 10*3/uL (ref 0.7–4.0)
MCH: 31.5 pg (ref 26.0–34.0)
MCHC: 35.1 g/dL (ref 30.0–36.0)
MCV: 89.7 fL (ref 78.0–100.0)
Monocytes Absolute: 0.7 10*3/uL (ref 0.1–1.0)
Monocytes Relative: 7 % (ref 3–12)
Neutro Abs: 7.2 10*3/uL (ref 1.7–7.7)
Neutrophils Relative %: 70 % (ref 43–77)
Platelets: 148 10*3/uL — ABNORMAL LOW (ref 150–400)
RBC: 5.24 MIL/uL (ref 4.22–5.81)
RDW: 12.8 % (ref 11.5–15.5)
WBC: 10.1 10*3/uL (ref 4.0–10.5)

## 2014-06-04 LAB — URINALYSIS, ROUTINE W REFLEX MICROSCOPIC
Bilirubin Urine: NEGATIVE
Glucose, UA: NEGATIVE mg/dL
Hgb urine dipstick: NEGATIVE
Ketones, ur: 15 mg/dL — AB
Leukocytes, UA: NEGATIVE
Nitrite: NEGATIVE
Protein, ur: NEGATIVE mg/dL
Specific Gravity, Urine: 1.015 (ref 1.005–1.030)
Urobilinogen, UA: 1 mg/dL (ref 0.0–1.0)
pH: 6 (ref 5.0–8.0)

## 2014-06-04 LAB — GLUCOSE, CAPILLARY: Glucose-Capillary: 129 mg/dL — ABNORMAL HIGH (ref 70–99)

## 2014-06-04 LAB — COMPREHENSIVE METABOLIC PANEL
ALT: 43 U/L (ref 0–53)
AST: 24 U/L (ref 0–37)
Albumin: 4.3 g/dL (ref 3.5–5.2)
Alkaline Phosphatase: 62 U/L (ref 39–117)
BUN: 11 mg/dL (ref 6–23)
CO2: 25 mEq/L (ref 19–32)
Calcium: 9.7 mg/dL (ref 8.4–10.5)
Chloride: 99 mEq/L (ref 96–112)
Creatinine, Ser: 0.87 mg/dL (ref 0.50–1.35)
GFR calc Af Amer: >90 mL/min (ref >90)
GFR calc non Af Amer: 89 mL/min — ABNORMAL LOW (ref >90)
Glucose, Bld: 139 mg/dL — ABNORMAL HIGH (ref 70–99)
Potassium: 4.2 mEq/L (ref 3.7–5.3)
Sodium: 137 mEq/L (ref 137–147)
Total Bilirubin: 1.5 mg/dL — ABNORMAL HIGH (ref 0.3–1.2)
Total Protein: 7.1 g/dL (ref 6.0–8.3)

## 2014-06-04 LAB — MAGNESIUM: Magnesium: 1.9 mg/dL (ref 1.5–2.5)

## 2014-06-04 LAB — BASIC METABOLIC PANEL
BUN: 11 mg/dL (ref 6–23)
CO2: 26 mEq/L (ref 19–32)
Calcium: 9.2 mg/dL (ref 8.4–10.5)
Chloride: 101 mEq/L (ref 96–112)
Creatinine, Ser: 0.81 mg/dL (ref 0.50–1.35)
GFR calc Af Amer: >90 mL/min (ref >90)
GFR calc non Af Amer: >90 mL/min (ref >90)
Glucose, Bld: 130 mg/dL — ABNORMAL HIGH (ref 70–99)
Potassium: 4.2 mEq/L (ref 3.7–5.3)
Sodium: 138 mEq/L (ref 137–147)

## 2014-06-04 LAB — LIPASE, BLOOD: Lipase: 31 U/L (ref 11–59)

## 2014-06-04 MED ORDER — METRONIDAZOLE IN NACL 5-0.79 MG/ML-% IV SOLN
500.0000 mg | Freq: Once | INTRAVENOUS | Status: AC
  Administered 2014-06-04: 500 mg via INTRAVENOUS
  Filled 2014-06-04: qty 100

## 2014-06-04 MED ORDER — IOHEXOL 300 MG/ML  SOLN
50.0000 mL | Freq: Once | INTRAMUSCULAR | Status: AC | PRN
  Administered 2014-06-04: 50 mL via ORAL

## 2014-06-04 MED ORDER — PIPERACILLIN-TAZOBACTAM 3.375 G IVPB
3.3750 g | Freq: Three times a day (TID) | INTRAVENOUS | Status: DC
  Administered 2014-06-04 – 2014-06-13 (×26): 3.375 g via INTRAVENOUS
  Filled 2014-06-04 (×28): qty 50

## 2014-06-04 MED ORDER — ONDANSETRON HCL 4 MG/2ML IJ SOLN
4.0000 mg | Freq: Four times a day (QID) | INTRAMUSCULAR | Status: DC | PRN
  Administered 2014-06-05 – 2014-06-12 (×8): 4 mg via INTRAVENOUS
  Filled 2014-06-04 (×9): qty 2

## 2014-06-04 MED ORDER — IOHEXOL 300 MG/ML  SOLN
100.0000 mL | Freq: Once | INTRAMUSCULAR | Status: AC | PRN
  Administered 2014-06-04: 100 mL via INTRAVENOUS

## 2014-06-04 MED ORDER — SODIUM CHLORIDE 0.9 % IV BOLUS (SEPSIS)
1000.0000 mL | INTRAVENOUS | Status: AC
  Administered 2014-06-04: 1000 mL via INTRAVENOUS

## 2014-06-04 MED ORDER — KETOROLAC TROMETHAMINE 30 MG/ML IJ SOLN
30.0000 mg | Freq: Once | INTRAMUSCULAR | Status: AC
  Administered 2014-06-04: 30 mg via INTRAVENOUS
  Filled 2014-06-04: qty 1

## 2014-06-04 MED ORDER — HYDROMORPHONE HCL PF 1 MG/ML IJ SOLN
1.0000 mg | Freq: Once | INTRAMUSCULAR | Status: AC
  Administered 2014-06-04: 1 mg via INTRAVENOUS
  Filled 2014-06-04: qty 1

## 2014-06-04 MED ORDER — DOFETILIDE 500 MCG PO CAPS
500.0000 ug | ORAL_CAPSULE | Freq: Two times a day (BID) | ORAL | Status: DC
  Filled 2014-06-04 (×2): qty 1

## 2014-06-04 MED ORDER — DOFETILIDE 500 MCG PO CAPS
500.0000 ug | ORAL_CAPSULE | Freq: Two times a day (BID) | ORAL | Status: DC
  Administered 2014-06-04 – 2014-06-05 (×3): 500 ug via ORAL
  Filled 2014-06-04 (×7): qty 1

## 2014-06-04 MED ORDER — NITROGLYCERIN 0.4 MG SL SUBL: 0.4000 mg | SUBLINGUAL_TABLET | SUBLINGUAL | Status: DC | PRN

## 2014-06-04 MED ORDER — ONDANSETRON HCL 4 MG/2ML IJ SOLN
4.0000 mg | Freq: Once | INTRAMUSCULAR | Status: AC
  Administered 2014-06-04: 4 mg via INTRAVENOUS
  Filled 2014-06-04: qty 2

## 2014-06-04 MED ORDER — HYDROMORPHONE HCL PF 1 MG/ML IJ SOLN
1.0000 mg | Freq: Once | INTRAMUSCULAR | Status: AC
Start: 2014-06-04 — End: 2014-06-04
  Administered 2014-06-04: 1 mg via INTRAVENOUS
  Filled 2014-06-04: qty 1

## 2014-06-04 MED ORDER — METFORMIN HCL ER 500 MG PO TB24
500.0000 mg | ORAL_TABLET | Freq: Two times a day (BID) | ORAL | Status: DC
  Filled 2014-06-04 (×4): qty 1

## 2014-06-04 MED ORDER — POTASSIUM CHLORIDE IN NACL 20-0.45 MEQ/L-% IV SOLN
INTRAVENOUS | Status: DC
  Administered 2014-06-04 – 2014-06-05 (×3): via INTRAVENOUS
  Filled 2014-06-04 (×5): qty 1000

## 2014-06-04 MED ORDER — CIPROFLOXACIN IN D5W 400 MG/200ML IV SOLN
400.0000 mg | Freq: Once | INTRAVENOUS | Status: AC
  Administered 2014-06-04: 400 mg via INTRAVENOUS
  Filled 2014-06-04: qty 200

## 2014-06-04 MED ORDER — HYDROMORPHONE HCL PF 1 MG/ML IJ SOLN
0.5000 mg | INTRAMUSCULAR | Status: DC | PRN
  Administered 2014-06-04: 2 mg via INTRAVENOUS
  Administered 2014-06-05: 0.5 mg via INTRAVENOUS
  Administered 2014-06-05: 2 mg via INTRAVENOUS
  Administered 2014-06-05: 0.5 mg via INTRAVENOUS
  Administered 2014-06-05: 2 mg via INTRAVENOUS
  Administered 2014-06-06 (×2): 1 mg via INTRAVENOUS
  Filled 2014-06-04: qty 1
  Filled 2014-06-04: qty 2
  Filled 2014-06-04: qty 1
  Filled 2014-06-04: qty 2
  Filled 2014-06-04: qty 1
  Filled 2014-06-04: qty 2
  Filled 2014-06-04: qty 1

## 2014-06-04 MED ORDER — ENOXAPARIN SODIUM 40 MG/0.4ML ~~LOC~~ SOLN
40.0000 mg | SUBCUTANEOUS | Status: DC
  Administered 2014-06-04 – 2014-06-12 (×8): 40 mg via SUBCUTANEOUS
  Filled 2014-06-04 (×11): qty 0.4

## 2014-06-04 MED ORDER — PANTOPRAZOLE SODIUM 40 MG PO TBEC
40.0000 mg | DELAYED_RELEASE_TABLET | Freq: Every day | ORAL | Status: DC
  Administered 2014-06-05 – 2014-06-13 (×8): 40 mg via ORAL
  Filled 2014-06-04 (×11): qty 1

## 2014-06-04 MED ORDER — PANTOPRAZOLE SODIUM 40 MG IV SOLR
40.0000 mg | Freq: Every day | INTRAVENOUS | Status: DC
  Filled 2014-06-04: qty 40

## 2014-06-04 MED ORDER — KETOROLAC TROMETHAMINE 30 MG/ML IJ SOLN
30.0000 mg | Freq: Three times a day (TID) | INTRAMUSCULAR | Status: DC
  Administered 2014-06-04 – 2014-06-05 (×2): 30 mg via INTRAVENOUS
  Filled 2014-06-04 (×5): qty 1

## 2014-06-04 MED ORDER — HYDROMORPHONE HCL PF 1 MG/ML IJ SOLN
0.5000 mg | INTRAMUSCULAR | Status: AC
  Administered 2014-06-04: 0.5 mg via INTRAVENOUS
  Filled 2014-06-04: qty 1

## 2014-06-04 NOTE — ED Notes (Signed)
Pt encouraged to void when able. Pt receiving bolus of fluid at present time.

## 2014-06-04 NOTE — Progress Notes (Signed)
ANTIBIOTIC CONSULT NOTE - INITIAL  Pharmacy Consult for Zosyn Indication: Appendicitis  Allergies  Allergen Reactions  . Penicillins     Childhood reaction   . Procaine Hcl Hives and Other (See Comments)    Red in the face and neck   . Pseudoephedrine Other (See Comments)    Patient went into afib  . Sulfonamide Derivatives     Childhood reaction     Patient Measurements:   Adjusted Body Weight:   Vital Signs: Temp: 97.5 F (36.4 C) (06/06 1542) Temp src: Oral (06/06 1542) BP: 102/63 mmHg (06/06 1542) Pulse Rate: 57 (06/06 1114) Intake/Output from previous day:   Intake/Output from this shift: Total I/O In: -  Out: 300 [Urine:300]  Labs:  Recent Labs  06/04/14 0935  WBC 10.1  HGB 16.5  PLT 148*  CREATININE 0.87   The CrCl is unknown because both a height and weight (above a minimum accepted value) are required for this calculation. No results found for this basename: VANCOTROUGH, VANCOPEAK, VANCORANDOM, GENTTROUGH, GENTPEAK, GENTRANDOM, TOBRATROUGH, TOBRAPEAK, TOBRARND, AMIKACINPEAK, AMIKACINTROU, AMIKACIN,  in the last 72 hours   Microbiology: No results found for this or any previous visit (from the past 720 hour(s)).  Medical History: Past Medical History  Diagnosis Date  . HYPERLIPIDEMIA 03/22/2009  . Obesity, unspecified 04/24/2009  . THROMBOCYTOPENIA 08/16/2010  . ERECTILE DYSFUNCTION 03/22/2009  . SLEEP APNEA, OBSTRUCTIVE 03/22/2009    compliant with CPAP  . 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 Coronary atherosclerosis of native coronary artery 11/2002    a. s/p stent to LAD 12/03; OM2 occluded at cath 12/03; d. myoview 5/10: no ischemia;  e. echo 7/11: EF 55%, BAE, mild RVE, PASP 41-45; Myoview was in March 2013. There was no ischemia or infarction, EF 51%   . Atrial fibrillation 03/22/2009    a. s/p multiple DCCV; b. no coumadin due to low TE risk profile; c. Tikosyn Rx  . GERD 03/22/2009  . LATERAL EPICONDYLITIS, LEFT 10/24/2009  . HYPERGLYCEMIA  04/25/2010  . LIVER FUNCTION TESTS, ABNORMAL 04/25/2010  . TOBACCO USE, QUIT 10/24/2009  . Umbilical hernia   . Myocardial infarction mi2003  . Local reaction to immunization 05/05/2012    minor resolving  zostavax   . Numbness in left leg     foot related to back disease and surgery    Assessment: 60 yoM with PMHx CAD, Afib on pradaxa and tikosyn, HLD, obesity, OSA, and thrombocytopenia presents to [facility] with appendicitis.  Surgery planned tentatively for 6/8 d/t washout period for pradaxa (~48 hours).  Pt with allergy to penicillins (rxn unknown).  Discussed with MD who would like to do trial of zosyn and try to avoid cipro due to DDI with tikosyn.    6/6 >> Zosyn  >>  Tmax: Afebrile WBCs: 10.1K - WNL Renal: SCr 0.87 - WNL  No cultures   Goal of Therapy:  Eradication of infection  Plan:  Zosyn 3.375g IV q8h (infuse over 4 hours) Will discuss with RN to watch closely during administration of first dose  Ralene Bathe, PharmD, BCPS 06/04/2014, 4:09 PM  Pager: [phone]

## 2014-06-04 NOTE — ED Notes (Signed)
Pt transported to CT ?

## 2014-06-04 NOTE — ED Notes (Signed)
Attempted to call report to the floor. Accepting RN not available. Will call back for report.

## 2014-06-04 NOTE — ED Provider Notes (Signed)
CSN: [id number]     Arrival date & time 06/04/14  0909 History   First MD Initiated Contact with Patient 06/04/14 [phone]     Chief Complaint  Patient presents with  . Abdominal Pain     (Consider location/radiation/quality/duration/timing/severity/associated sxs/prior Treatment) Patient is a 63 y.o. male presenting with abdominal pain. The history is provided by the patient.  Abdominal Pain Pain location:  Epigastric, LUQ and RUQ Pain quality: aching and bloating   Pain radiates to:  Does not radiate Pain severity:  Moderate Onset quality:  Gradual Duration:  2 days Timing:  Constant Progression:  Waxing and waning Chronicity:  New Context comment:  At rest Relieved by:  Nothing Worsened by:  Nothing tried Ineffective treatments:  None tried Associated symptoms: chills and nausea   Associated symptoms: no chest pain, no cough, no diarrhea, no dysuria, no fever, no hematuria, no shortness of breath and no vomiting     Past Medical History  Diagnosis Date  . HYPERLIPIDEMIA 03/22/2009  . Obesity, unspecified 04/24/2009  . THROMBOCYTOPENIA 08/16/2010  . ERECTILE DYSFUNCTION 03/22/2009  . SLEEP APNEA, OBSTRUCTIVE 03/22/2009    compliant with CPAP  . 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 Coronary atherosclerosis of native coronary artery 11/2002    a. s/p stent to LAD 12/03; OM2 occluded at cath 12/03; d. myoview 5/10: no ischemia;  e. echo 7/11: EF 55%, BAE, mild RVE, PASP 41-45; Myoview was in March 2013. There was no ischemia or infarction, EF 51%   . Atrial fibrillation 03/22/2009    a. s/p multiple DCCV; b. no coumadin due to low TE risk profile; c. Tikosyn Rx  . GERD 03/22/2009  . LATERAL EPICONDYLITIS, LEFT 10/24/2009  . HYPERGLYCEMIA 04/25/2010  . LIVER FUNCTION TESTS, ABNORMAL 04/25/2010  . TOBACCO USE, QUIT 10/24/2009  . Umbilical hernia   . Myocardial infarction mi2003  . Local reaction to immunization 05/05/2012    minor resolving  zostavax   . Numbness in left leg     foot  related to back disease and surgery   Past Surgical History  Procedure Laterality Date  . Cyst on epiglottis  08/2002  . Surgery l4-l5   1998    ruptured x 3  . Stent      LAD DUS 2004  . Ulnar neuropathy    . Umbilical hernia repair      mesh  . Lumbar disc surgery      two  holes in spinalcovering  . Esophagogastroduodenoscopy  11/15/2011    Procedure: ESOPHAGOGASTRODUODENOSCOPY (EGD);  Surgeon: Juanita Craver, MD;  Location: [facility];  Service: Endoscopy;  Laterality: N/A;  . Colonoscopy  11/15/2011    Procedure: COLONOSCOPY;  Surgeon: Juanita Craver, MD;  Location: [facility];  Service: Endoscopy;  Laterality: N/A;   Family History  Problem Relation Age of Onset  . Thyroid disease Mother   . Ovarian cancer Mother   . Breast cancer Mother   . Lung cancer Father   . Cancer Father    History  Substance Use Topics  . Smoking status: Former Smoker -- 2.00 packs/day for 42 years    Types: Cigarettes    Quit date: 12/30/2008  . Smokeless tobacco: Never Used     Comment: started at age 7; 1-2 ppd; quit 2010  . Alcohol Use: No    Review of Systems  Constitutional: Positive for chills. Negative for fever.  HENT: Negative for drooling and rhinorrhea.   Eyes: Negative for pain.  Respiratory: Negative for cough and  shortness of breath.   Cardiovascular: Negative for chest pain and leg swelling.  Gastrointestinal: Positive for nausea and abdominal pain. Negative for vomiting and diarrhea.  Genitourinary: Negative for dysuria and hematuria.  Musculoskeletal: Negative for gait problem and neck pain.  Skin: Negative for color change.  Neurological: Negative for numbness and headaches.  Hematological: Negative for adenopathy.  Psychiatric/Behavioral: Negative for behavioral problems.  All other systems reviewed and are negative.     Allergies  Penicillins; Procaine hcl; Pseudoephedrine; and Sulfonamide derivatives  Home Medications   Prior to Admission medications    Medication Sig Start Date End Date Taking? Authorizing Provider  atorvastatin (LIPITOR) 80 MG tablet Take 1 tablet by mouth  daily    Burnis Medin, MD  dofetilide (TIKOSYN) 500 MCG capsule TAKE 1 CAPSULE (500 MCG TOTAL) BY MOUTH 2 (TWO) TIMES DAILY. 03/15/14   Lelon Perla, MD  famotidine (PEPCID) 20 MG tablet Take 20 mg by mouth as needed.    Historical Provider, MD  fish oil-omega-3 fatty acids 1000 MG capsule Take 2 g by mouth daily.      Historical Provider, MD  MAGNESIUM OXIDE PO Take 750 mg by mouth daily.     Historical Provider, MD  metFORMIN (GLUCOPHAGE-XR) 500 MG 24 hr tablet Take 1 tablet (500 mg total) by mouth 2 (two) times daily. 03/22/14   Burnis Medin, MD  MULTIPLE VITAMIN PO Take 1 tablet by mouth daily.     Historical Provider, MD  nitroGLYCERIN (NITROSTAT) 0.4 MG SL tablet Place 1 tablet (0.4 mg total) under the tongue every 5 (five) minutes as needed. 05/20/14 01/28/17  Liliane Shi, PA-C  pantoprazole (PROTONIX) 40 MG tablet TAKE 1 TABLET BY MOUTH EVERY DAY 02/23/14   Burnis Medin, MD  potassium chloride SA (KLOR-CON M20) 20 MEQ tablet TAKE 1 TABLET BY MOUTH ONCE DAILY 05/10/14   Lelon Perla, MD  PRADAXA 150 MG CAPS capsule TAKE 1 CAPSULE BY MOUTH EVERY 12 HOURS    Burnis Medin, MD  zolpidem (AMBIEN) 10 MG tablet TAKE 1 TABLET BY MOUTH AT BEDTIME AS NEEDED 02/23/14   Burnis Medin, MD   BP 147/87  Pulse 60  Temp(Src) 98.7 F (37.1 C) (Oral)  Resp 16  SpO2 94% Physical Exam  Nursing note and vitals reviewed. Constitutional: He is oriented to person, place, and time. He appears well-developed and well-nourished.  HENT:  Head: Normocephalic and atraumatic.  Right Ear: External ear normal.  Left Ear: External ear normal.  Nose: Nose normal.  Mouth/Throat: Oropharynx is clear and moist. No oropharyngeal exudate.  Eyes: Conjunctivae and EOM are normal. Pupils are equal, round, and reactive to light.  Neck: Normal range of motion. Neck supple.   Cardiovascular: Normal rate, regular rhythm, normal heart sounds and intact distal pulses.  Exam reveals no gallop and no friction rub.   No murmur heard. Pulmonary/Chest: Effort normal and breath sounds normal. No respiratory distress. He has no wheezes.  Abdominal: Soft. Bowel sounds are normal. He exhibits no distension. There is tenderness (diffuse upper abdominal tenderness to palpation which seems to be worse in the right upper quadrant.). There is no rebound and no guarding.  Musculoskeletal: Normal range of motion. He exhibits no edema and no tenderness.  Neurological: He is alert and oriented to person, place, and time.  Skin: Skin is warm and dry.  Psychiatric: He has a normal mood and affect. His behavior is normal.    ED Course  Procedures (including  critical care time) Labs Review Labs Reviewed  CBC WITH DIFFERENTIAL - Abnormal; Notable for the following:    Platelets 148 (*)    All other components within normal limits  COMPREHENSIVE METABOLIC PANEL - Abnormal; Notable for the following:    Glucose, Bld 139 (*)    Total Bilirubin 1.5 (*)    GFR calc non Af Amer 89 (*)    All other components within normal limits  URINALYSIS, ROUTINE W REFLEX MICROSCOPIC - Abnormal; Notable for the following:    Ketones, ur 15 (*)    All other components within normal limits  LIPASE, BLOOD    Imaging Review Ct Abdomen Pelvis W Contrast  06/04/2014   CLINICAL DATA:  Right upper quadrant pain for 3 days with nausea.  EXAM: CT ABDOMEN AND PELVIS WITH CONTRAST  TECHNIQUE: Multidetector CT imaging of the abdomen and pelvis was performed using the standard protocol following bolus administration of intravenous contrast.  CONTRAST:  55mL OMNIPAQUE IOHEXOL 300 MG/ML SOLN, 136mL OMNIPAQUE IOHEXOL 300 MG/ML SOLN  COMPARISON:  06/12/2012 abdominal MRI.  No prior CT.  FINDINGS: Lower Chest: Bibasilar centrilobular emphysema with probable scarring. Minimal pleural/ subpleural thickening on image  5/series 2 is favored to represent an area of scarring. 6 mm. Mild cardiomegaly with coronary artery atherosclerosis. Small hiatal hernia.  Abdomen/Pelvis: Normal liver, spleen, distal stomach, pancreas. 1.9 cm gallstone without acute cholecystitis or biliary ductal dilatation. Normal adrenal glands. Interpolar right renal cyst of 4.3 cm. Too small to characterize interpolar right renal lesion. Normal left kidney.  Atherosclerosis at the origin of bilateral renal arteries. No retroperitoneal or retrocrural adenopathy. Scattered colonic diverticula. Enlarged inflamed appendix identified on image 46. No evidence of surrounding abscess or free perforation.  Normal small bowel.  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 No pelvic adenopathy. Normal urinary bladder and prostate. No significant free fluid.  Bones/Musculoskeletal: Degenerative disc disease including at L4 through S1.  IMPRESSION: 1. Uncomplicated appendicitis. These results were called by telephone at the time of interpretation on 06/04/2014 at 11:51 AM to Dr. Shea Evans, Aline Brochure , who verbally acknowledged these results. 2. Cholelithiasis. 3. Right common iliac artery aneurysm, 2.1 cm. 4. Probable pleural based scarring in the right lung base. Given the concurrent centrilobular emphysema, follow-up chest CT at 6 - 12 months is recommended. This recommendation follows the consensus statement: "Guidelines for Management of Small Pulmonary Nodules Detected on CT Scans: A Statement from the [employer]" as published in Radiology2005;237:395-400. Online at: [email or web address].arnold.com/.   Electronically Signed   By: Abigail Miyamoto M.D.   On: 06/04/2014 11:51     EKG Interpretation   Date/Time:  Saturday June 04 2014 09:47:27 EDT Ventricular Rate:  52 PR Interval:  207 QRS Duration: 116 QT Interval:  484 QTC Calculation: 450 R Axis:   39 Text Interpretation:  Sinus rhythm Supraventricular bigeminy  Incomplete  right bundle branch block Low voltage, precordial leads Confirmed by  Jaxsun Ciampi  MD, [place] ([id number]) on 06/04/2014 9:52:52 AM      MDM   Final diagnoses:  Appendicitis    9:27 AM 63 y.o. male with a history of A. Fib on tikosyn/pradaxa, MI who presents with abdominal pain which began 2 days ago. He states that it initially felt like gas. He notes his pain has been gradually worsening over the last day. He notes it is now more localized to the upper abdomen and slightly worse in the right upper quadrant. He denies any vomiting but has had  some nausea. He had a small bowel movement yesterday. He also notes chills. He is afebrile and vital signs are unremarkable here. Will get labwork and imaging. Dilaudid for pain control.  Found to have appendicitis. Ordered cipro/flagyl. Will admit to [place].     Blanchard Kelch, MD 06/04/14 1530

## 2014-06-04 NOTE — ED Notes (Addendum)
Pt reports HR in 50s is normal for him. Aline Brochure MD aware.  Pt still unable to void. Pt encouraged to void when able.

## 2014-06-04 NOTE — ED Notes (Signed)
Per pharmacy, patient has an allergy to PCN, order for Zosyn-needs to be watched for adverse reaction once antibiotic is started

## 2014-06-04 NOTE — ED Notes (Addendum)
Pt report worsening pain to RUQ since Thursday. Pt reports nausea but no vomiting with decreased appetite. Pt reports normal BM. Pt denies urinary symptoms.   Aline Brochure MD at bedside.

## 2014-06-04 NOTE — H&P (Signed)
Re:   [name] [name] DOB:   04/19/1951 MRN:   [id number]  Admission Note  ASSESSMENT AND PLAN: 1.  Appendicitis  I discussed with the patient the indications and risks of appendiceal surgery.  The primary risks of appendiceal surgery include, but are not limited to, bleeding, infection, bowel surgery, and open surgery.  There is also the risk that the patient may have continued symptoms after surgery.  However, the likelihood of improvement in symptoms and return to the patient's normal status is good. We discussed the typical post-operative recovery course. I tried to answer the patient's questions.  Unfortunately, because of the anti-coagulation - will plan admit, NPO, antibiotics, and plan surgery for about 48 hours.  2.  Anti coagulated on Pradaxa 3.  Thrombocytopenia  Plt - 138,000 - 06/04/2014 4.  CAD s/p stent to LAD in 2003  I spoke to Dr. Wells Guiles about seeing the patient for card clearance.  He thought [name] does not need transitional anticoagulation. 5.  A fib -  On Tikosyn. 6.  Gallstones 7.  Right common iliac art aneurysm 8.  Right lung base scarring  Follow up CT scan in 6 to 12 months  I gave the patient a copy of his CT scan.  He is seeing Dr. Regis Bill in July for his PE and can discuss these findings with her. 9.  OSA - ion CPAP 10.  Diabetes   Last Hgb A1C - 7.9 - 03/22/2014  Chief Complaint  Patient presents with  . Abdominal Pain   REFERRING PHYSICIAN: Lottie Dawson, MD  HISTORY OF PRESENT ILLNESS: [name] [name] is a 63 y.o. (DOB: 1951/05/20)  white  male whose primary care physician is Lottie Dawson, MD and comes to [facility] today for abdominal pain.  He started having pain Thursday evening, 6/4.  The pain got worse yesterday, but he did not come to the ER until today.  He had a prior umbilical hernia by Dr. Madison Hickman in 2009.  He has had no other abdominal surgery.  He had a normal colonoscopy in 2012 by Dr. Collene Mares.  CT Abdomen - 06/04/2104 - 1.  Uncomplicated appendicitis. These results were called by telephone at the time of interpretation on 06/04/2014 at 11:51 AM to  Dr. Shea Evans, Aline Brochure , who verbally acknowledged these results.  2. Cholelithiasis.  3. Right common iliac artery aneurysm, 2.1 cm.  4. Probable pleural based scarring in the right lung base. Given the concurrent centrilobular emphysema, follow-up chest CT at 6 - 12 months is recommended. WBC - 10,100 - 06/04/2014   Past Medical History  Diagnosis Date  . HYPERLIPIDEMIA 03/22/2009  . Obesity, unspecified 04/24/2009  . THROMBOCYTOPENIA 08/16/2010  . ERECTILE DYSFUNCTION 03/22/2009  . SLEEP APNEA, OBSTRUCTIVE 03/22/2009    compliant with CPAP  . 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 Coronary atherosclerosis of native coronary artery 11/2002    a. s/p stent to LAD 12/03; OM2 occluded at cath 12/03; d. myoview 5/10: no ischemia;  e. echo 7/11: EF 55%, BAE, mild RVE, PASP 41-45; Myoview was in March 2013. There was no ischemia or infarction, EF 51%   . Atrial fibrillation 03/22/2009    a. s/p multiple DCCV; b. no coumadin due to low TE risk profile; c. Tikosyn Rx  . GERD 03/22/2009  . LATERAL EPICONDYLITIS, LEFT 10/24/2009  . HYPERGLYCEMIA 04/25/2010  . LIVER FUNCTION TESTS, ABNORMAL 04/25/2010  . TOBACCO USE, QUIT 10/24/2009  . Umbilical hernia   . Myocardial infarction mi2003  .  Local reaction to immunization 05/05/2012    minor resolving  zostavax   . Numbness in left leg     foot related to back disease and surgery      Past Surgical History  Procedure Laterality Date  . Cyst on epiglottis  08/2002  . Surgery l4-l5   1998    ruptured x 3  . Stent      LAD DUS 2004  . Ulnar neuropathy    . Umbilical hernia repair      mesh  . Lumbar disc surgery      two  holes in spinalcovering  . Esophagogastroduodenoscopy  11/15/2011    Procedure: ESOPHAGOGASTRODUODENOSCOPY (EGD);  Surgeon: Juanita Craver, MD;  Location: [facility];  Service: Endoscopy;  Laterality: N/A;  .  Colonoscopy  11/15/2011    Procedure: COLONOSCOPY;  Surgeon: Juanita Craver, MD;  Location: [facility];  Service: Endoscopy;  Laterality: N/A;      Current Facility-Administered Medications  Medication Dose Route Frequency Provider Last Rate Last Dose  . ciprofloxacin (CIPRO) IVPB 400 mg  400 mg Intravenous Once Blanchard Kelch, MD 200 mL/hr at 06/04/14 1200 400 mg at 06/04/14 1200  . metroNIDAZOLE (FLAGYL) IVPB 500 mg  500 mg Intravenous Once Blanchard Kelch, MD       Current Outpatient Prescriptions  Medication Sig Dispense Refill  . atorvastatin (LIPITOR) 80 MG tablet Take 80 mg by mouth at bedtime.      . clotrimazole (LOTRIMIN) 1 % cream Apply 1 application topically daily as needed.      . dabigatran (PRADAXA) 150 MG CAPS capsule Take 150 mg by mouth 2 (two) times daily.      [name] dofetilide (TIKOSYN) 500 MCG capsule Take 500 mcg by mouth 2 (two) times daily.      . famotidine (PEPCID) 20 MG tablet Take 10-20 mg by mouth daily as needed for heartburn or indigestion.       . fish oil-omega-3 fatty acids 1000 MG capsule Take 1 g by mouth 2 (two) times daily.       [name] MAGNESIUM OXIDE PO Take 750 mg by mouth every evening.       . metFORMIN (GLUCOPHAGE-XR) 500 MG 24 hr tablet Take 500 mg by mouth 2 (two) times daily.      . MULTIPLE VITAMIN PO Take 1 tablet by mouth every evening.       . nitroGLYCERIN (NITROSTAT) 0.4 MG SL tablet Place 1 tablet (0.4 mg total) under the tongue every 5 (five) minutes as needed.  25 tablet  11  . pantoprazole (PROTONIX) 40 MG tablet Take 40 mg by mouth daily.      . potassium chloride SA (K-DUR,KLOR-CON) 20 MEQ tablet Take 20 mEq by mouth daily.      [name] zolpidem (AMBIEN) 10 MG tablet Take 10 mg by mouth at bedtime as needed for sleep.          Allergies  Allergen Reactions  . Penicillins     Childhood reaction   . Procaine Hcl Hives and Other (See Comments)    Red in the face and neck   . Pseudoephedrine Other (See Comments)    Patient went into  afib  . Sulfonamide Derivatives     Childhood reaction     REVIEW OF SYSTEMS: Skin:  No history of rash.  No history of abnormal moles. Infection:  No history of hepatitis or HIV.  No history of MRSA. Neurologic:  Numbness left hand from prior pressure during  surgery and ball of left foot from prior back issues.  Cardiac:  CAD.  History of A Fib.  Sees Dr. Stanford Breed.  On Pradaxa.    Saw Richardson Dopp on 05/20/2014 -  No new issues. Pulmonary:  Smoked until 5 years ago.  CT scan shows some right basilar scarring and suggested followup.  The patient has a copy of the CT scan report.    OSA on CPAP  Endocrine:  No diabetes. No thyroid disease. Gastrointestinal:  See HPI. Urologic:  No history of kidney stones.  No history of bladder infections. Musculoskeletal:  History of back surgery Hematologic: Thrombocytopenia - saw Dr. Jamse Arn.  No known cause.  On Pradaxa. Psycho-social:  The patient is oriented.   The patient has no obvious psychologic or social impairment to understanding our conversation and plan.  SOCIAL and FAMILY HISTORY: Wife, Orlando Penner, with patient (she has back issues) Retired Audiological scientist since 2006. He has one son - 38 yo who lives in [place].  PHYSICAL EXAM: BP 116/53  Pulse 57  Temp(Src) 98.7 F (37.1 C) (Oral)  Resp 11  SpO2 96%  General: Obese WM who is alert.  He is clearly uncomfortable with RLQ pain. HEENT: Normal. Pupils equal. Neck: Supple. No mass.  No thyroid mass. Lymph Nodes:  No supraclavicular or cervical nodes. Lungs: Clear to auscultation and symmetric breath sounds. Heart:  RRR. No murmur or rub. Abdomen: Obese.  Tender RLQ.  Scar at umbilicus. Rectal: Not done. Extremities:  Good strength and ROM  in upper and lower extremities. Psychiatric: Has normal mood and affect. Behavior is normal.   DATA REVIEWED: Epic notes.  Alphonsa Overall, MD,  [facility], [address]  [place], [place]     [place] Phone:  [phone] FAX:  [phone]

## 2014-06-04 NOTE — ED Notes (Addendum)
Pt reports nausea at present time and pain level continues to be rated at 9/10. Petros notified. Verbalizes given pt zofran and consult with Lucia Gaskins MD about pain medication.

## 2014-06-04 NOTE — ED Notes (Signed)
Pt returned from CT. Pt reported pain "back up to an 8." Aline Brochure MD notified.

## 2014-06-04 NOTE — ED Notes (Signed)
MD at bedside. 

## 2014-06-04 NOTE — ED Notes (Signed)
General surgery at bedside. 

## 2014-06-04 NOTE — ED Notes (Signed)
Pt reports "spike in pain." Aline Brochure MD aware.

## 2014-06-04 NOTE — ED Notes (Signed)
Bess RN notified of Hanley Seamen notes per pharmacy as recorded in ED noted 1601.

## 2014-06-04 NOTE — Progress Notes (Signed)
MEDICATION RELATED CONSULT NOTE - INITIAL   Pharmacy Consult for Tikosyn Indication: Atrial fibrillation  Allergies  Allergen Reactions  . Penicillins     Childhood reaction   . Procaine Hcl Hives and Other (See Comments)    Red in the face and neck   . Pseudoephedrine Other (See Comments)    Patient went into afib  . Sulfonamide Derivatives     Childhood reaction     Patient Measurements: Height: 6\' 1"  (185.4 cm) Weight: 330 lb (149.687 kg) IBW/kg (Calculated) : 79.9 Adjusted Body Weight:  Vital Signs: Temp: 97.5 F (36.4 C) (06/06 1542) Temp src: Oral (06/06 1542) BP: 102/63 mmHg (06/06 1542) Pulse Rate: 57 (06/06 1114) Intake/Output from previous day:   Intake/Output from this shift: Total I/O In: -  Out: 300 [Urine:300]  Labs:  Recent Labs  06/04/14 0935  WBC 10.1  HGB 16.5  HCT 47.0  PLT 148*  CREATININE 0.87  ALBUMIN 4.3  PROT 7.1  AST 24  ALT 43  ALKPHOS 62  BILITOT 1.5*   Estimated Creatinine Clearance: 130.8 ml/min (by C-G formula based on Cr of 0.87).   Microbiology: No results found for this or any previous visit (from the past 720 hour(s)).  Medical History: Past Medical History  Diagnosis Date  . HYPERLIPIDEMIA 03/22/2009  . Obesity, unspecified 04/24/2009  . THROMBOCYTOPENIA 08/16/2010  . ERECTILE DYSFUNCTION 03/22/2009  . SLEEP APNEA, OBSTRUCTIVE 03/22/2009    compliant with CPAP  . 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 Coronary atherosclerosis of native coronary artery 11/2002    a. s/p stent to LAD 12/03; OM2 occluded at cath 12/03; d. myoview 5/10: no ischemia;  e. echo 7/11: EF 55%, BAE, mild RVE, PASP 41-45; Myoview was in March 2013. There was no ischemia or infarction, EF 51%   . Atrial fibrillation 03/22/2009    a. s/p multiple DCCV; b. no coumadin due to low TE risk profile; c. Tikosyn Rx  . GERD 03/22/2009  . LATERAL EPICONDYLITIS, LEFT 10/24/2009  . HYPERGLYCEMIA 04/25/2010  . LIVER FUNCTION TESTS, ABNORMAL 04/25/2010  .  TOBACCO USE, QUIT 10/24/2009  . Umbilical hernia   . Myocardial infarction mi2003  . Local reaction to immunization 05/05/2012    minor resolving  zostavax   . Numbness in left leg     foot related to back disease and surgery   Assessment 60 yoM with PMHx CAD s/p stent to LAD in '03, PAFon pradaxa and tikosyn, HLD, obesity, OSA, and thrombocytopenia presents to [facility] with appendicitis. Surgery planned tentatively for 6/8 d/t washout period for pradaxa (~48 hours).  Pharmacy consulted to monitor tikosyn during hospitalization.    PTA Tikosyn 517mcg BID, LD 6/6 @ 0830.  Resumed.    6/6:  QTc = 450 (goal <500) K+ = 4.2 (goal >4) Mg+ = not drawn, will order level for tomorrow AM (goal >2) Renal fxn: SCr 0.87, CrCl >100  DDI: Zofran PRN - Additive QT interval prolongation may occur during coadministration of ondansetron and dofetilide - monitoring QTc closely  Plan:  F/u daily for QTc, electrolytes, renal function, and potential for DDI  Ralene Bathe, PharmD, BCPS 06/04/2014, 4:49 PM  Pager: [phone]

## 2014-06-04 NOTE — Consult Note (Addendum)
CONSULTATION NOTE  Reason for Consult: Acute appendicitis, anticoagulation management, pre-operative cardiac assessment  Requesting Physician: Dr. Lucia Gaskins  Cardiologist: Dr. Stanford Breed  HPI: This is a 63 y.o. male with a past medical history significant for CAD s/p stent to LAD in 2003, parox AFib (9 cardioversions), HL, OSA. He underwent DCCV last in 05/2012 for AFib. He has seen Dr. Thompson Grayer and ablation could be considered if he lost weight. He has maintained NSR on Tikosyn. Last seen by Dr. [name] in 10/2013. He had an Echo (06/2010): EF 55%, mild dilated aortic root, MAC, mild LAE, mild RVE, mild RAE, PASP 41-45 mmHg and Nuclear stress test (03/2012): No ischemia, EF 51%; Normal.  He presents today with 10/10 chest pain and RUQ tenderness - exam and CT consistent with acute appendicitis. He is on pradaxa for his a-fib.  Cardiology is consulted regarding management recommendations.  PMHx:  Past Medical History  Diagnosis Date  . HYPERLIPIDEMIA 03/22/2009  . Obesity, unspecified 04/24/2009  . THROMBOCYTOPENIA 08/16/2010  . ERECTILE DYSFUNCTION 03/22/2009  . SLEEP APNEA, OBSTRUCTIVE 03/22/2009    compliant with CPAP  . 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 Coronary atherosclerosis of native coronary artery 11/2002    a. s/p stent to LAD 12/03; OM2 occluded at cath 12/03; d. myoview 5/10: no ischemia;  e. echo 7/11: EF 55%, BAE, mild RVE, PASP 41-45; Myoview was in March 2013. There was no ischemia or infarction, EF 51%   . Atrial fibrillation 03/22/2009    a. s/p multiple DCCV; b. no coumadin due to low TE risk profile; c. Tikosyn Rx  . GERD 03/22/2009  . LATERAL EPICONDYLITIS, LEFT 10/24/2009  . HYPERGLYCEMIA 04/25/2010  . LIVER FUNCTION TESTS, ABNORMAL 04/25/2010  . TOBACCO USE, QUIT 10/24/2009  . Umbilical hernia   . Myocardial infarction mi2003  . Local reaction to immunization 05/05/2012    minor resolving  zostavax   . Numbness in left leg     foot related to back  disease and surgery   Past Surgical History  Procedure Laterality Date  . Cyst on epiglottis  08/2002  . Surgery l4-l5   1998    ruptured x 3  . Stent      LAD DUS 2004  . Ulnar neuropathy    . Umbilical hernia repair      mesh  . Lumbar disc surgery      two  holes in spinalcovering  . Esophagogastroduodenoscopy  11/15/2011    Procedure: ESOPHAGOGASTRODUODENOSCOPY (EGD);  Surgeon: Juanita Craver, MD;  Location: [facility];  Service: Endoscopy;  Laterality: N/A;  . Colonoscopy  11/15/2011    Procedure: COLONOSCOPY;  Surgeon: Juanita Craver, MD;  Location: [facility];  Service: Endoscopy;  Laterality: N/A;    FAMHx: Family History  Problem Relation Age of Onset  . Thyroid disease Mother   . Ovarian cancer Mother   . Breast cancer Mother   . Lung cancer Father   . Cancer Father     SOCHx:  reports that he quit smoking about 5 years ago. His smoking use included Cigarettes. He has a 84 pack-year smoking history. He has never used smokeless tobacco. He reports that he does not drink alcohol or use illicit drugs.  ALLERGIES: Allergies  Allergen Reactions  . Penicillins     Childhood reaction   . Procaine Hcl Hives and Other (See Comments)    Red in the face and neck   . Pseudoephedrine Other (See Comments)    Patient went into  afib  . Sulfonamide Derivatives     Childhood reaction     ROS: A comprehensive review of systems was negative except for: Gastrointestinal: positive for abdominal pain and nausea  HOME MEDICATIONS:   Medication List    ASK your doctor about these medications       atorvastatin 80 MG tablet  Commonly known as:  LIPITOR  Take 80 mg by mouth at bedtime.     clotrimazole 1 % cream  Commonly known as:  LOTRIMIN  Apply 1 application topically daily as needed.     dabigatran 150 MG Caps capsule  Commonly known as:  PRADAXA  Take 150 mg by mouth 2 (two) times daily.     dofetilide 500 MCG capsule  Commonly known as:  TIKOSYN  Take 500 mcg  by mouth 2 (two) times daily.     famotidine 20 MG tablet  Commonly known as:  PEPCID  Take 10-20 mg by mouth daily as needed for heartburn or indigestion.     fish oil-omega-3 fatty acids 1000 MG capsule  Take 1 g by mouth 2 (two) times daily.     MAGNESIUM OXIDE PO  Take 750 mg by mouth every evening.     metFORMIN 500 MG 24 hr tablet  Commonly known as:  GLUCOPHAGE-XR  Take 500 mg by mouth 2 (two) times daily.     MULTIPLE VITAMIN PO  Take 1 tablet by mouth every evening.     nitroGLYCERIN 0.4 MG SL tablet  Commonly known as:  NITROSTAT  Place 1 tablet (0.4 mg total) under the tongue every 5 (five) minutes as needed.     pantoprazole 40 MG tablet  Commonly known as:  PROTONIX  Take 40 mg by mouth daily.     potassium chloride SA 20 MEQ tablet  Commonly known as:  K-DUR,KLOR-CON  Take 20 mEq by mouth daily.     zolpidem 10 MG tablet  Commonly known as:  AMBIEN  Take 10 mg by mouth at bedtime as needed for sleep.       HOSPITAL MEDICATIONS: I have reviewed the patient's current medications.  VITALS: Blood pressure 126/59, pulse 57, temperature 98.7 F (37.1 C), temperature source Oral, resp. rate 13, SpO2 95.00%.  PHYSICAL EXAM: General appearance: alert, mild distress and morbidly obese Neck: no carotid bruit and no JVD Lungs: clear to auscultation bilaterally Heart: regular rate and rhythm, S1, S2 normal, no murmur, click, rub or gallop Abdomen: obese, protuberant, very tender to light palpation, + guarding Extremities: extremities normal, atraumatic, no cyanosis or edema Pulses: 2+ and symmetric Skin: Skin color, texture, turgor normal. No rashes or lesions Neurologic: Grossly normal Psych: Normal  LABS: Results for orders placed during the hospital encounter of 06/04/14 (from the past 48 hour(s))  CBC WITH DIFFERENTIAL     Status: Abnormal   Collection Time    06/04/14  9:35 AM      Result Value Ref Range   WBC 10.1  4.0 - 10.5 K/uL   RBC 5.24  4.22  - 5.81 MIL/uL   Hemoglobin 16.5  13.0 - 17.0 g/dL   HCT 47.0  39.0 - 52.0 %   MCV 89.7  78.0 - 100.0 fL   MCH 31.5  26.0 - 34.0 pg   MCHC 35.1  30.0 - 36.0 g/dL   RDW 12.8  11.5 - 15.5 %   Platelets 148 (*) 150 - 400 K/uL   Neutrophils Relative % 70  43 - 77 %  Neutro Abs 7.2  1.7 - 7.7 K/uL   Lymphocytes Relative 18  12 - 46 %   Lymphs Abs 1.8  0.7 - 4.0 K/uL   Monocytes Relative 7  3 - 12 %   Monocytes Absolute 0.7  0.1 - 1.0 K/uL   Eosinophils Relative 5  0 - 5 %   Eosinophils Absolute 0.5  0.0 - 0.7 K/uL   Basophils Relative 0  0 - 1 %   Basophils Absolute 0.0  0.0 - 0.1 K/uL  COMPREHENSIVE METABOLIC PANEL     Status: Abnormal   Collection Time    06/04/14  9:35 AM      Result Value Ref Range   Sodium 137  137 - 147 mEq/L   Potassium 4.2  3.7 - 5.3 mEq/L   Chloride 99  96 - 112 mEq/L   CO2 25  19 - 32 mEq/L   Glucose, Bld 139 (*) 70 - 99 mg/dL   BUN 11  6 - 23 mg/dL   Creatinine, Ser 0.87  0.50 - 1.35 mg/dL   Calcium 9.7  8.4 - 10.5 mg/dL   Total Protein 7.1  6.0 - 8.3 g/dL   Albumin 4.3  3.5 - 5.2 g/dL   AST 24  0 - 37 U/L   ALT 43  0 - 53 U/L   Alkaline Phosphatase 62  39 - 117 U/L   Total Bilirubin 1.5 (*) 0.3 - 1.2 mg/dL   GFR calc non Af Amer 89 (*) >90 mL/min   GFR calc Af Amer >90  >90 mL/min   Comment: (NOTE)     The eGFR has been calculated using the CKD EPI equation.     This calculation has not been validated in all clinical situations.     eGFR's persistently <90 mL/min signify possible Chronic Kidney     Disease.  LIPASE, BLOOD     Status: None   Collection Time    06/04/14  9:35 AM      Result Value Ref Range   Lipase 31  11 - 59 U/L  URINALYSIS, ROUTINE W REFLEX MICROSCOPIC     Status: Abnormal   Collection Time    06/04/14 10:38 AM      Result Value Ref Range   Color, Urine YELLOW  YELLOW   APPearance CLEAR  CLEAR   Specific Gravity, Urine 1.015  1.005 - 1.030   pH 6.0  5.0 - 8.0   Glucose, UA NEGATIVE  NEGATIVE mg/dL   Hgb urine dipstick  NEGATIVE  NEGATIVE   Bilirubin Urine NEGATIVE  NEGATIVE   Ketones, ur 15 (*) NEGATIVE mg/dL   Protein, ur NEGATIVE  NEGATIVE mg/dL   Urobilinogen, UA 1.0  0.0 - 1.0 mg/dL   Nitrite NEGATIVE  NEGATIVE   Leukocytes, UA NEGATIVE  NEGATIVE   Comment: MICROSCOPIC NOT DONE ON URINES WITH NEGATIVE PROTEIN, BLOOD, LEUKOCYTES, NITRITE, OR GLUCOSE <1000 mg/dL.    IMAGING: Ct Abdomen Pelvis W Contrast  06/04/2014   CLINICAL DATA:  Right upper quadrant pain for 3 days with nausea.  EXAM: CT ABDOMEN AND PELVIS WITH CONTRAST  TECHNIQUE: Multidetector CT imaging of the abdomen and pelvis was performed using the standard protocol following bolus administration of intravenous contrast.  CONTRAST:  87m OMNIPAQUE IOHEXOL 300 MG/ML SOLN, 1033mOMNIPAQUE IOHEXOL 300 MG/ML SOLN  COMPARISON:  06/12/2012 abdominal MRI.  No prior CT.  FINDINGS: Lower Chest: Bibasilar centrilobular emphysema with probable scarring. Minimal pleural/ subpleural thickening on image 5/series 2 is favored  to represent an area of scarring. 6 mm. Mild cardiomegaly with coronary artery atherosclerosis. Small hiatal hernia.  Abdomen/Pelvis: Normal liver, spleen, distal stomach, pancreas. 1.9 cm gallstone without acute cholecystitis or biliary ductal dilatation. Normal adrenal glands. Interpolar right renal cyst of 4.3 cm. Too small to characterize interpolar right renal lesion. Normal left kidney.  Atherosclerosis at the origin of bilateral renal arteries. No retroperitoneal or retrocrural adenopathy. Scattered colonic diverticula. Enlarged inflamed appendix identified on image 46. No evidence of surrounding abscess or free perforation.  Normal small bowel.  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 No pelvic adenopathy. Normal urinary bladder and prostate. No significant free fluid.  Bones/Musculoskeletal: Degenerative disc disease including at L4 through S1.  IMPRESSION: 1. Uncomplicated appendicitis. These results were called by  telephone at the time of interpretation on 06/04/2014 at 11:51 AM to Dr. Shea Evans, Aline Brochure , who verbally acknowledged these results. 2. Cholelithiasis. 3. Right common iliac artery aneurysm, 2.1 cm. 4. Probable pleural based scarring in the right lung base. Given the concurrent centrilobular emphysema, follow-up chest CT at 6 - 12 months is recommended. This recommendation follows the consensus statement: "Guidelines for Management of Small Pulmonary Nodules Detected on CT Scans: A Statement from the [place]" as published in Radiology2005;237:395-400. Online at: [email or web address].[email or web address].[email or web address]   Electronically Signed   By: Abigail Miyamoto M.D.   On: 06/04/2014 11:51   EKG: Sinus rhythm at 52, grouped atrial beating, QTc 450 msec   HOSPITAL DIAGNOSES: Active Problems:   HYPERLIPIDEMIA   Coronary atherosclerosis of unspecified type of vessel, native or graft   Atrial fibrillation   Chronic anticoagulation   Appendicitis   IMPRESSION: 1. Low to intermediate risk, for non-elective surgery 2. History of PAF 3. Long-term Tikosyn therapy  RECOMMENDATION: 1. Mr. Sheeler had a negative stress test 2 years ago and was recently seen in the office, not having angina. He should be okay from a cardiac standpoint for surgery. I would advise holding his pradaxa and if his appendix could be "cooled" off with antibiotics, waiting for 2 days for surgery to decrease bleeding risk. If there are worsening signs of acute abdomen or surgery feels that he needs to go to the OR, then he will need to accept the increased risk of bleeding. Unfortunately, a specific reversal agent for pradaxa is not yet available (awaiting FDA approval) - centers have used prothrombin complex concentrates [facility]) or activated factor VII with some success, if he were to have bleeding issues.  The other issue is that his on Tikosyn, which has maintained him in sinus rhythm - I would advise continuing this, however,  it is contraindicated to give with cipro as it may prolong QT interval and precipitate polymorphic VF.  I would recommend an alternative antibiotic - d/w Dr. Lucia Gaskins- it seems that his penicillin allergy is somewhat weak and may have been sulfa related,.  Would recommend trial of Zosyn, which is safe to use with tikosyn. Re-check EKG in the am tomorrow to monitor QTc.  Thanks for consulting [place]. We will follow with you.   Time Spent Directly with Patient: 30 minutes  Pixie Casino, MD, [facility] Attending Cardiologist [place] 06/04/2014, 3:27 PM

## 2014-06-05 LAB — CBC WITH DIFFERENTIAL/PLATELET
Basophils Absolute: 0 10*3/uL (ref 0.0–0.1)
Basophils Relative: 0 % (ref 0–1)
Eosinophils Absolute: 0.1 10*3/uL (ref 0.0–0.7)
Eosinophils Relative: 0 % (ref 0–5)
HCT: 46.2 % (ref 39.0–52.0)
Hemoglobin: 16 g/dL (ref 13.0–17.0)
Lymphocytes Relative: 4 % — ABNORMAL LOW (ref 12–46)
Lymphs Abs: 1 10*3/uL (ref 0.7–4.0)
MCH: 31.7 pg (ref 26.0–34.0)
MCHC: 34.6 g/dL (ref 30.0–36.0)
MCV: 91.5 fL (ref 78.0–100.0)
Monocytes Absolute: 1.6 10*3/uL — ABNORMAL HIGH (ref 0.1–1.0)
Monocytes Relative: 7 % (ref 3–12)
Neutro Abs: 19.9 10*3/uL — ABNORMAL HIGH (ref 1.7–7.7)
Neutrophils Relative %: 89 % — ABNORMAL HIGH (ref 43–77)
Platelets: 125 10*3/uL — ABNORMAL LOW (ref 150–400)
RBC: 5.05 MIL/uL (ref 4.22–5.81)
RDW: 13.1 % (ref 11.5–15.5)
WBC: 22.6 10*3/uL — ABNORMAL HIGH (ref 4.0–10.5)

## 2014-06-05 LAB — BASIC METABOLIC PANEL
BUN: 17 mg/dL (ref 6–23)
CO2: 22 mEq/L (ref 19–32)
Calcium: 8.7 mg/dL (ref 8.4–10.5)
Chloride: 100 mEq/L (ref 96–112)
Creatinine, Ser: 1.62 mg/dL — ABNORMAL HIGH (ref 0.50–1.35)
GFR calc Af Amer: 50 mL/min — ABNORMAL LOW (ref >90)
GFR calc non Af Amer: 43 mL/min — ABNORMAL LOW (ref >90)
Glucose, Bld: 147 mg/dL — ABNORMAL HIGH (ref 70–99)
Potassium: 4.7 mEq/L (ref 3.7–5.3)
Sodium: 137 mEq/L (ref 137–147)

## 2014-06-05 LAB — PROTIME-INR
INR: 1.27 (ref 0.00–1.49)
Prothrombin Time: 15.6 seconds — ABNORMAL HIGH (ref 11.6–15.2)

## 2014-06-05 LAB — GLUCOSE, CAPILLARY: Glucose-Capillary: 143 mg/dL — ABNORMAL HIGH (ref 70–99)

## 2014-06-05 LAB — HEMOGLOBIN A1C
Hgb A1c MFr Bld: 6.5 % — ABNORMAL HIGH (ref <5.7)
Mean Plasma Glucose: 140 mg/dL — ABNORMAL HIGH (ref <117)

## 2014-06-05 LAB — MAGNESIUM: Magnesium: 1.7 mg/dL (ref 1.5–2.5)

## 2014-06-05 LAB — APTT: aPTT: 35 seconds (ref 24–37)

## 2014-06-05 MED ORDER — MAGNESIUM SULFATE IN D5W 10-5 MG/ML-% IV SOLN
1.0000 g | Freq: Once | INTRAVENOUS | Status: AC
  Administered 2014-06-05: 1 g via INTRAVENOUS
  Filled 2014-06-05: qty 100

## 2014-06-05 MED ORDER — SODIUM CHLORIDE 0.9 % IV BOLUS (SEPSIS)
500.0000 mL | Freq: Once | INTRAVENOUS | Status: AC
Start: 2014-06-05 — End: 2014-06-05
  Administered 2014-06-05: 500 mL via INTRAVENOUS

## 2014-06-05 MED ORDER — SODIUM CHLORIDE 0.9 % IV BOLUS (SEPSIS)
500.0000 mL | Freq: Once | INTRAVENOUS | Status: AC
  Administered 2014-06-05: 500 mL via INTRAVENOUS

## 2014-06-05 MED ORDER — SODIUM CHLORIDE 0.9 % IV SOLN
INTRAVENOUS | Status: DC
  Administered 2014-06-05 – 2014-06-13 (×18): via INTRAVENOUS

## 2014-06-05 MED ORDER — ACETAMINOPHEN 10 MG/ML IV SOLN
1000.0000 mg | Freq: Four times a day (QID) | INTRAVENOUS | Status: AC
  Administered 2014-06-05 – 2014-06-06 (×4): 1000 mg via INTRAVENOUS
  Filled 2014-06-05 (×7): qty 100

## 2014-06-05 MED ORDER — MAGNESIUM SULFATE 40 MG/ML IJ SOLN: 2.0000 g | Freq: Once | INTRAMUSCULAR | Status: DC

## 2014-06-05 NOTE — Progress Notes (Signed)
Patient's BP was rechecked after 500cc bolus given. Patient not complaining of sweating and feeling hot. Pain has gone down and nausea went away. BP still 82/52. MD notified and new orders given for a second bolus. Bolus infusing now. Will recheck once done.

## 2014-06-05 NOTE — Progress Notes (Signed)
Pt only void once since this am, bladder scan shows only 100-145cc urine.

## 2014-06-05 NOTE — Progress Notes (Signed)
PHARMACIST - PHYSICIAN COMMUNICATION  CONCERNING:  METFORMIN SAFE ADMINISTRATION POLICY  RECOMMENDATION: Metformin has been placed on DISCONTINUE (rejected order) STATUS and should be reordered only after any of the conditions below are ruled out.  Current safety recommendations include avoiding metformin for a minimum of 48 hours after the patient's exposure to intravenous contrast media.  DESCRIPTION:  The Pharmacy Committee has adopted a policy that restricts the use of metformin in hospitalized patients until all the contraindications to administration have been ruled out. Specific contraindications are: [x]  Serum creatinine ? 1.5 for males []  Serum creatinine ? 1.4 for females []  Shock, acute MI, sepsis, hypoxemia, dehydration []  Planned administration of intravenous iodinated contrast media []  Heart Failure patients with low EF []  Acute or chronic metabolic acidosis (including DKA)      Thanks, Pharmacy Ralene Bathe, PharmD, BCPS 06/05/2014, 7:46 AM  Pager: [phone]

## 2014-06-05 NOTE — Progress Notes (Addendum)
General Surgery Note  LOS: 1 day  POD -     Assessment/Plan: 1. Appendicitis   Because of the anti-coagulation - NPO, antibiotics, and plan surgery for probably Monday.  For sepsis - To give fluid bolus and repeat labs in AM.  Dr. Zella Richer will be our doctor of the week.  On Zosyn - 06/04/2014 >>>   2. Anti coagulated on Pradaxa   Last dose Sat AM, 6/6. 3. Thrombocytopenia   Plt - 125,000 - 06/05/2014  4. CAD s/p stent to LAD in 2003   Seen by Dr. Varney Daily 5. A fib - On Tikosyn.  6. Gallstones  7. Right common iliac art aneurysm  8. Right lung base scarring   Follow up CT scan in 6 to 12 months   I gave the patient a copy of his CT scan. He is seeing Dr. Regis Bill in July for his PE and can discuss these findings with her.  9. OSA - ion CPAP  10. Diabetes   Last Hgb A1C - 7.9 - 03/22/2014 11.  Acute renal injury  Probably secondary to sepsis  To give more fluid and follow labs 12.  DVT prophylaxis - Lovenox 13.  Hypotension last PM - needs more fluids.   Active Problems:   HYPERLIPIDEMIA   Coronary atherosclerosis of unspecified type of vessel, native or graft   Atrial fibrillation   Chronic anticoagulation   Appendicitis  Subjective:  Less abdominal tenderness.  But feels febrile.  No nausea.  Not hungry.  Step son in room with patient. Objective:   Filed Vitals:   06/05/14 0905  BP: 100/68  Pulse:   Temp:   Resp:      Intake/Output from previous day:  06/06 0701 - 06/07 0700 In: 750 [I.V.:750] Out: 650 [Urine:650]  Intake/Output this shift:      Physical Exam:   General: Obese flushed WM who is alert and oriented.    HEENT: Normal. Pupils equal. .   Lungs: Clear.   Abdomen: Tender right abdomen, but less so that last PM.  Has BS.   Lab Results:    Recent Labs  06/04/14 0935 06/05/14 0518  WBC 10.1 22.6*  HGB 16.5 16.0  HCT 47.0 46.2  PLT 148* 125*    BMET   Recent Labs  06/04/14 1635 06/05/14 0518  NA 138 137  K 4.2 4.7  CL 101 100   CO2 26 22  GLUCOSE 130* 147*  BUN 11 17  CREATININE 0.81 1.62*  CALCIUM 9.2 8.7    PT/INR   Recent Labs  06/05/14 0518  LABPROT 15.6*  INR 1.27    ABG  No results found for this basename: PHART, PCO2, PO2, HCO3,  in the last 72 hours   Studies/Results:  Ct Abdomen Pelvis W Contrast  06/04/2014   CLINICAL DATA:  Right upper quadrant pain for 3 days with nausea.  EXAM: CT ABDOMEN AND PELVIS WITH CONTRAST  TECHNIQUE: Multidetector CT imaging of the abdomen and pelvis was performed using the standard protocol following bolus administration of intravenous contrast.  CONTRAST:  12mL OMNIPAQUE IOHEXOL 300 MG/ML SOLN, 156mL OMNIPAQUE IOHEXOL 300 MG/ML SOLN  COMPARISON:  06/12/2012 abdominal MRI.  No prior CT.  FINDINGS: Lower Chest: Bibasilar centrilobular emphysema with probable scarring. Minimal pleural/ subpleural thickening on image 5/series 2 is favored to represent an area of scarring. 6 mm. Mild cardiomegaly with coronary artery atherosclerosis. Small hiatal hernia.  Abdomen/Pelvis: Normal liver, spleen, distal stomach, pancreas. 1.9 cm gallstone without acute cholecystitis  or biliary ductal dilatation. Normal adrenal glands. Interpolar right renal cyst of 4.3 cm. Too small to characterize interpolar right renal lesion. Normal left kidney.  Atherosclerosis at the origin of bilateral renal arteries. No retroperitoneal or retrocrural adenopathy. Scattered colonic diverticula. Enlarged inflamed appendix identified on image 46. No evidence of surrounding abscess or free perforation.  Normal small bowel.  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 No pelvic adenopathy. Normal urinary bladder and prostate. No significant free fluid.  Bones/Musculoskeletal: Degenerative disc disease including at L4 through S1.  IMPRESSION: 1. Uncomplicated appendicitis. These results were called by telephone at the time of interpretation on 06/04/2014 at 11:51 AM to Dr. Shea Evans, Aline Brochure , who verbally  acknowledged these results. 2. Cholelithiasis. 3. Right common iliac artery aneurysm, 2.1 cm. 4. Probable pleural based scarring in the right lung base. Given the concurrent centrilobular emphysema, follow-up chest CT at 6 - 12 months is recommended. This recommendation follows the consensus statement: "Guidelines for Management of Small Pulmonary Nodules Detected on CT Scans: A Statement from the [employer]" as published in Radiology2005;237:395-400. Online at: [email or web address]   Electronically Signed   By: Abigail Miyamoto M.D.   On: 06/04/2014 11:51     Anti-infectives:   Anti-infectives   Start     Dose/Rate Route Frequency Ordered Stop   06/04/14 1800  piperacillin-tazobactam (ZOSYN) IVPB 3.375 g     3.375 g 12.5 mL/hr over 240 Minutes Intravenous Every 8 hours 06/04/14 1655     06/04/14 1200  ciprofloxacin (CIPRO) IVPB 400 mg     400 mg 200 mL/hr over 60 Minutes Intravenous  Once 06/04/14 1154 06/04/14 1300   06/04/14 1200  metroNIDAZOLE (FLAGYL) IVPB 500 mg     500 mg 100 mL/hr over 60 Minutes Intravenous  Once 06/04/14 1154 06/04/14 1409      Alphonsa Overall, MD, FACS Pager: [facility] Office: [phone] 06/05/2014

## 2014-06-05 NOTE — Progress Notes (Addendum)
SUBJECTIVE:  Had problems with symptomatic hypotension last PM  OBJECTIVE:   Vitals:   Filed Vitals:   06/05/14 0346 06/05/14 0421 06/05/14 0511 06/05/14 0523  BP:  82/52 79/50 90/58   Pulse:   98   Temp: 98.6 F (37 C)  98.4 F (36.9 C)   TempSrc: Oral  Oral   Resp:   18   Height:      Weight:      SpO2:   92%    I&O's:   Intake/Output Summary (Last 24 hours) at 06/05/14 0757 Last data filed at 06/05/14 0226  Gross per 24 hour  Intake    750 ml  Output    650 ml  Net    100 ml   TELEMETRY: Reviewed telemetry pt in NSR:     PHYSICAL EXAM General: Well developed, well nourished, in no acute distress Head: Eyes PERRLA, No xanthomas.   Normal cephalic and atramatic  Lungs:   Clear bilaterally to auscultation and percussion. Heart:   HRRR S1 S2 Pulses are 2+ & equal. Abdomen: Bowel sounds are positive, abdomen soft and non-tender without masses  Extremities:   No clubbing, cyanosis or edema.  DP +1 Neuro: Alert and oriented X 3. Psych:  Good affect, responds appropriately   LABS: Basic Metabolic Panel:  Recent Labs  06/04/14 1635 06/05/14 0518  NA 138 137  K 4.2 4.7  CL 101 100  CO2 26 22  GLUCOSE 130* 147*  BUN 11 17  CREATININE 0.81 1.62*  CALCIUM 9.2 8.7  MG 1.9 1.7   Liver Function Tests:  Recent Labs  06/04/14 0935  AST 24  ALT 43  ALKPHOS 62  BILITOT 1.5*  PROT 7.1  ALBUMIN 4.3    Recent Labs  06/04/14 0935  LIPASE 31   CBC:  Recent Labs  06/04/14 0935 06/05/14 0518  WBC 10.1 22.6*  NEUTROABS 7.2 19.9*  HGB 16.5 16.0  HCT 47.0 46.2  MCV 89.7 91.5  PLT 148* 125*   Cardiac Enzymes: No results found for this basename: CKTOTAL, CKMB, CKMBINDEX, TROPONINI,  in the last 72 hours BNP: No components found with this basename: POCBNP,  D-Dimer: No results found for this basename: DDIMER,  in the last 72 hours Hemoglobin A1C: No results found for this basename: HGBA1C,  in the last 72 hours Fasting Lipid Panel: No results found  for this basename: CHOL, HDL, LDLCALC, TRIG, CHOLHDL, LDLDIRECT,  in the last 72 hours Thyroid Function Tests: No results found for this basename: TSH, T4TOTAL, FREET3, T3FREE, THYROIDAB,  in the last 72 hours Anemia Panel: No results found for this basename: VITAMINB12, FOLATE, FERRITIN, TIBC, IRON, RETICCTPCT,  in the last 72 hours Coag Panel:   Lab Results  Component Value Date   INR 1.27 06/05/2014   INR 1.03 05/18/2011   INR 1.00 11/14/2010   PROTIME 20.7 06/13/2009    RADIOLOGY: Ct Abdomen Pelvis W Contrast  06/04/2014   CLINICAL DATA:  Right upper quadrant pain for 3 days with nausea.  EXAM: CT ABDOMEN AND PELVIS WITH CONTRAST  TECHNIQUE: Multidetector CT imaging of the abdomen and pelvis was performed using the standard protocol following bolus administration of intravenous contrast.  CONTRAST:  72mL OMNIPAQUE IOHEXOL 300 MG/ML SOLN, 154mL OMNIPAQUE IOHEXOL 300 MG/ML SOLN  COMPARISON:  06/12/2012 abdominal MRI.  No prior CT.  FINDINGS: Lower Chest: Bibasilar centrilobular emphysema with probable scarring. Minimal pleural/ subpleural thickening on image 5/series 2 is favored to represent an area of scarring. 6 mm.  Mild cardiomegaly with coronary artery atherosclerosis. Small hiatal hernia.  Abdomen/Pelvis: Normal liver, spleen, distal stomach, pancreas. 1.9 cm gallstone without acute cholecystitis or biliary ductal dilatation. Normal adrenal glands. Interpolar right renal cyst of 4.3 cm. Too small to characterize interpolar right renal lesion. Normal left kidney.  Atherosclerosis at the origin of bilateral renal arteries. No retroperitoneal or retrocrural adenopathy. Scattered colonic diverticula. Enlarged inflamed appendix identified on image 46. No evidence of surrounding abscess or free perforation.  Normal small bowel.  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 No pelvic adenopathy. Normal urinary bladder and prostate. No significant free fluid.  Bones/Musculoskeletal:  Degenerative disc disease including at L4 through S1.  IMPRESSION: 1. Uncomplicated appendicitis. These results were called by telephone at the time of interpretation on 06/04/2014 at 11:51 AM to Dr. Shea Evans, Aline Brochure , who verbally acknowledged these results. 2. Cholelithiasis. 3. Right common iliac artery aneurysm, 2.1 cm. 4. Probable pleural based scarring in the right lung base. Given the concurrent centrilobular emphysema, follow-up chest CT at 6 - 12 months is recommended. This recommendation follows the consensus statement: "Guidelines for Management of Small Pulmonary Nodules Detected on CT Scans: A Statement from the [employer]" as published in Radiology2005;237:395-400. Online at: [email or web address]   Electronically Signed   By: Abigail Miyamoto M.D.   On: 06/04/2014 11:51   HOSPITAL DIAGNOSES:  Active Problems:  HYPERLIPIDEMIA  Coronary atherosclerosis of unspecified type of vessel, native or graft  Atrial fibrillation  Chronic anticoagulation  Appendicitis   IMPRESSION:  1. Low to intermediate risk, for non-elective surgery 2. History of PAF 3. Long-term Tikosyn therapy 4.  RECOMMENDATION:  1. [name] had a negative stress test 2 years ago and was recently seen in the office, not having angina. He should be okay from a cardiac standpoint for surgery. Would advise holding his pradaxa and if his appendix could be "cooled" off with antibiotics, waiting off Pradaxa for 48 hours for surgery to decrease bleeding risk. If there are worsening signs of acute abdomen or surgery feels that he needs to go to the OR, then he will need to accept the increased risk of bleeding. Unfortunately, a specific reversal agent for pradaxa is not yet available (awaiting FDA approval) - centers have used prothrombin complex concentrates [facility]) or activated factor VII with some success, if he were to have bleeding issues.  2. The other issue is that his on Tikosyn, which  has maintained him in sinus rhythm - I would advise continuing this, however, it is contraindicated to give with cipro as it may prolong QT interval and precipitate polymorphic VF. He has now been switched to Zosyn. Re-check EKG in the am tomorrow to monitor QTc.  Magnesium is low today so will give Magnesium sulfate 1gm IV and recheck in am.       3.  Hypotension - BP has remained low despite fluid boluses - this may be due to sepsis.  WBC is trending upward. May need to move to stepdown and consider low dose levophed if BP drops further.  May need to operate sooner.         Sueanne Margarita, MD  06/05/2014  7:57 AM

## 2014-06-05 NOTE — Progress Notes (Signed)
Assist primary RN with NS bolus administration.NS bolus given 500 cc/hr as ordered. Stayed with patient during the entire administration. Patient tolerated bolus well. VSs unchanged.  See computer documentation. Primary RN notified. Increased IVF to 150 cc/hr as ordered. RN will continue to monitor. SRP, RN BSN

## 2014-06-05 NOTE — Progress Notes (Addendum)
Patient 0200 vitals were taken, the BP was 80/46 and HR 106. Afebrile. Patient stated he felt hot and was sweating. Laid patient down and notified MD. Told MD the vitals and that patient had gotten pain medicine last at 0100. Patient's abdomen soft. New orders given for a 500cc bolus and to change the patient's rate to 150/hr. Will continue to monitor.

## 2014-06-06 ENCOUNTER — Encounter (HOSPITAL_COMMUNITY): Payer: Self-pay | Admitting: Certified Registered"

## 2014-06-06 ENCOUNTER — Encounter (HOSPITAL_COMMUNITY): Payer: 59 | Admitting: Anesthesiology

## 2014-06-06 ENCOUNTER — Encounter (HOSPITAL_COMMUNITY): Admission: EM | Disposition: A | Discharge status: Home Health | Payer: Self-pay | Source: Home / Self Care

## 2014-06-06 ENCOUNTER — Inpatient Hospital Stay (HOSPITAL_COMMUNITY): Payer: 59 | Admitting: Anesthesiology

## 2014-06-06 HISTORY — PX: APPENDECTOMY: SHX54

## 2014-06-06 HISTORY — PX: LAPAROSCOPIC APPENDECTOMY: SHX408

## 2014-06-06 LAB — GLUCOSE, CAPILLARY
Glucose-Capillary: 169 mg/dL — ABNORMAL HIGH (ref 70–99)
Glucose-Capillary: 153 mg/dL — ABNORMAL HIGH (ref 70–99)
Glucose-Capillary: 106 mg/dL — ABNORMAL HIGH (ref 70–99)
Glucose-Capillary: 95 mg/dL (ref 70–99)

## 2014-06-06 LAB — CBC WITH DIFFERENTIAL/PLATELET
Basophils Absolute: 0 10*3/uL (ref 0.0–0.1)
Basophils Relative: 0 % (ref 0–1)
Eosinophils Absolute: 0.2 10*3/uL (ref 0.0–0.7)
Eosinophils Relative: 1 % (ref 0–5)
HCT: 45.2 % (ref 39.0–52.0)
Hemoglobin: 15.3 g/dL (ref 13.0–17.0)
Lymphocytes Relative: 8 % — ABNORMAL LOW (ref 12–46)
Lymphs Abs: 1.4 10*3/uL (ref 0.7–4.0)
MCH: 31.5 pg (ref 26.0–34.0)
MCHC: 33.8 g/dL (ref 30.0–36.0)
MCV: 93 fL (ref 78.0–100.0)
Monocytes Absolute: 0.5 10*3/uL (ref 0.1–1.0)
Monocytes Relative: 3 % (ref 3–12)
Neutro Abs: 15.9 10*3/uL — ABNORMAL HIGH (ref 1.7–7.7)
Neutrophils Relative %: 88 % — ABNORMAL HIGH (ref 43–77)
Platelets: 118 10*3/uL — ABNORMAL LOW (ref 150–400)
RBC: 4.86 MIL/uL (ref 4.22–5.81)
RDW: 13.3 % (ref 11.5–15.5)
WBC: 18 10*3/uL — ABNORMAL HIGH (ref 4.0–10.5)

## 2014-06-06 LAB — BASIC METABOLIC PANEL
BUN: 25 mg/dL — ABNORMAL HIGH (ref 6–23)
CO2: 23 mEq/L (ref 19–32)
Calcium: 8.7 mg/dL (ref 8.4–10.5)
Chloride: 104 mEq/L (ref 96–112)
Creatinine, Ser: 1.63 mg/dL — ABNORMAL HIGH (ref 0.50–1.35)
GFR calc Af Amer: 50 mL/min — ABNORMAL LOW (ref >90)
GFR calc non Af Amer: 43 mL/min — ABNORMAL LOW (ref >90)
Glucose, Bld: 126 mg/dL — ABNORMAL HIGH (ref 70–99)
Potassium: 4.6 mEq/L (ref 3.7–5.3)
Sodium: 139 mEq/L (ref 137–147)

## 2014-06-06 LAB — SURGICAL PCR SCREEN
MRSA, PCR: NEGATIVE
Staphylococcus aureus: NEGATIVE

## 2014-06-06 LAB — THROMBIN TIME: Thrombin Time: 98.5 seconds — ABNORMAL HIGH (ref <21.0)

## 2014-06-06 LAB — MRSA PCR SCREENING: MRSA by PCR: NEGATIVE

## 2014-06-06 LAB — MAGNESIUM: Magnesium: 2.5 mg/dL (ref 1.5–2.5)

## 2014-06-06 SURGERY — APPENDECTOMY, LAPAROSCOPIC
Anesthesia: General | Site: Abdomen

## 2014-06-06 MED ORDER — ZOLPIDEM TARTRATE 5 MG PO TABS
5.0000 mg | ORAL_TABLET | Freq: Every evening | ORAL | Status: DC | PRN
  Administered 2014-06-06: 5 mg via ORAL
  Filled 2014-06-06: qty 1

## 2014-06-06 MED ORDER — MORPHINE SULFATE (PF) 1 MG/ML IV SOLN
INTRAVENOUS | Status: DC
  Administered 2014-06-06: 1.94 mg via INTRAVENOUS
  Administered 2014-06-06: 6 mg via INTRAVENOUS
  Administered 2014-06-07: 9 mg via INTRAVENOUS
  Filled 2014-06-06: qty 25

## 2014-06-06 MED ORDER — DIPHENHYDRAMINE HCL 12.5 MG/5ML PO ELIX: 12.5000 mg | ORAL_SOLUTION | Freq: Four times a day (QID) | ORAL | Status: DC | PRN

## 2014-06-06 MED ORDER — MORPHINE SULFATE (PF) 1 MG/ML IV SOLN
INTRAVENOUS | Status: DC
  Administered 2014-06-06: 19.7 mg via INTRAVENOUS
  Administered 2014-06-06: 11:00:00 via INTRAVENOUS
  Filled 2014-06-06: qty 25

## 2014-06-06 MED ORDER — ROCURONIUM BROMIDE 100 MG/10ML IV SOLN
INTRAVENOUS | Status: DC | PRN
  Administered 2014-06-06: 30 mg via INTRAVENOUS
  Administered 2014-06-06 (×3): 10 mg via INTRAVENOUS

## 2014-06-06 MED ORDER — LACTATED RINGERS IV SOLN: INTRAVENOUS | Status: DC

## 2014-06-06 MED ORDER — ONDANSETRON HCL 4 MG/2ML IJ SOLN: 4.0000 mg | Freq: Four times a day (QID) | INTRAMUSCULAR | Status: DC | PRN

## 2014-06-06 MED ORDER — LIDOCAINE HCL (CARDIAC) 20 MG/ML IV SOLN
INTRAVENOUS | Status: DC | PRN
  Administered 2014-06-06: 50 mg via INTRAVENOUS

## 2014-06-06 MED ORDER — FENTANYL CITRATE 0.05 MG/ML IJ SOLN
INTRAMUSCULAR | Status: AC
  Filled 2014-06-06: qty 5

## 2014-06-06 MED ORDER — DIPHENHYDRAMINE HCL 12.5 MG/5ML PO ELIX
12.5000 mg | ORAL_SOLUTION | Freq: Four times a day (QID) | ORAL | Status: DC | PRN
Start: 2014-06-06 — End: 2014-06-07

## 2014-06-06 MED ORDER — INSULIN ASPART 100 UNIT/ML ~~LOC~~ SOLN
0.0000 [IU] | SUBCUTANEOUS | Status: DC
  Administered 2014-06-06: 3 [IU] via SUBCUTANEOUS
  Administered 2014-06-09: 2 [IU] via SUBCUTANEOUS
  Administered 2014-06-10 (×2): 3 [IU] via SUBCUTANEOUS
  Administered 2014-06-11 – 2014-06-12 (×3): 2 [IU] via SUBCUTANEOUS

## 2014-06-06 MED ORDER — 0.9 % SODIUM CHLORIDE (POUR BTL) OPTIME
TOPICAL | Status: DC | PRN
  Administered 2014-06-06: 3000 mL

## 2014-06-06 MED ORDER — MORPHINE SULFATE (PF) 1 MG/ML IV SOLN
INTRAVENOUS | Status: AC
  Filled 2014-06-06: qty 25

## 2014-06-06 MED ORDER — FENTANYL CITRATE 0.05 MG/ML IJ SOLN
INTRAMUSCULAR | Status: DC | PRN
  Administered 2014-06-06: 100 ug via INTRAVENOUS
  Administered 2014-06-06 (×2): 50 ug via INTRAVENOUS

## 2014-06-06 MED ORDER — MIDAZOLAM HCL 2 MG/2ML IJ SOLN
INTRAMUSCULAR | Status: AC
  Filled 2014-06-06: qty 2

## 2014-06-06 MED ORDER — LACTATED RINGERS IR SOLN
Status: DC | PRN
  Administered 2014-06-06: 1000 mL

## 2014-06-06 MED ORDER — ONDANSETRON HCL 4 MG/2ML IJ SOLN
INTRAMUSCULAR | Status: AC
  Filled 2014-06-06: qty 2

## 2014-06-06 MED ORDER — NALOXONE HCL 0.4 MG/ML IJ SOLN: 0.4000 mg | INTRAMUSCULAR | Status: DC | PRN

## 2014-06-06 MED ORDER — LACTATED RINGERS IV SOLN
INTRAVENOUS | Status: DC | PRN
  Administered 2014-06-06 (×2): via INTRAVENOUS

## 2014-06-06 MED ORDER — NEOSTIGMINE METHYLSULFATE 10 MG/10ML IV SOLN
INTRAVENOUS | Status: DC | PRN
  Administered 2014-06-06: 5 mg via INTRAVENOUS

## 2014-06-06 MED ORDER — DOFETILIDE 250 MCG PO CAPS
250.0000 ug | ORAL_CAPSULE | Freq: Two times a day (BID) | ORAL | Status: DC
  Administered 2014-06-06 – 2014-06-08 (×4): 250 ug via ORAL
  Filled 2014-06-06 (×8): qty 1

## 2014-06-06 MED ORDER — PROPOFOL 10 MG/ML IV BOLUS
INTRAVENOUS | Status: AC
  Filled 2014-06-06: qty 20

## 2014-06-06 MED ORDER — SODIUM CHLORIDE 0.9 % IJ SOLN: 9.0000 mL | INTRAMUSCULAR | Status: DC | PRN

## 2014-06-06 MED ORDER — ROCURONIUM BROMIDE 100 MG/10ML IV SOLN
INTRAVENOUS | Status: AC
  Filled 2014-06-06: qty 1

## 2014-06-06 MED ORDER — SUCCINYLCHOLINE CHLORIDE 20 MG/ML IJ SOLN
INTRAMUSCULAR | Status: DC | PRN
  Administered 2014-06-06: 180 mg via INTRAVENOUS

## 2014-06-06 MED ORDER — ONDANSETRON HCL 4 MG/2ML IJ SOLN
INTRAMUSCULAR | Status: DC | PRN
  Administered 2014-06-06: 4 mg via INTRAVENOUS

## 2014-06-06 MED ORDER — PROPOFOL 10 MG/ML IV BOLUS
INTRAVENOUS | Status: DC | PRN
  Administered 2014-06-06: 200 mg via INTRAVENOUS

## 2014-06-06 MED ORDER — DIPHENHYDRAMINE HCL 50 MG/ML IJ SOLN: 12.5000 mg | Freq: Four times a day (QID) | INTRAMUSCULAR | Status: DC | PRN

## 2014-06-06 MED ORDER — LIDOCAINE HCL (CARDIAC) 20 MG/ML IV SOLN
INTRAVENOUS | Status: AC
  Filled 2014-06-06: qty 5

## 2014-06-06 MED ORDER — BUPIVACAINE-EPINEPHRINE (PF) 0.5% -1:200000 IJ SOLN
INTRAMUSCULAR | Status: AC
  Filled 2014-06-06: qty 30

## 2014-06-06 MED ORDER — MIDAZOLAM HCL 5 MG/5ML IJ SOLN
INTRAMUSCULAR | Status: DC | PRN
  Administered 2014-06-06: 2 mg via INTRAVENOUS

## 2014-06-06 MED ORDER — BUPIVACAINE HCL (PF) 0.5 % IJ SOLN
INTRAMUSCULAR | Status: DC | PRN
  Administered 2014-06-06: 8 mL

## 2014-06-06 MED ORDER — HYDROMORPHONE HCL PF 1 MG/ML IJ SOLN: 0.2500 mg | INTRAMUSCULAR | Status: DC | PRN

## 2014-06-06 MED ORDER — GLYCOPYRROLATE 0.2 MG/ML IJ SOLN
INTRAMUSCULAR | Status: DC | PRN
  Administered 2014-06-06: .8 mg via INTRAVENOUS

## 2014-06-06 SURGICAL SUPPLY — 50 items
APPLIER CLIP 5 13 M/L LIGAMAX5 (MISCELLANEOUS)
APPLIER CLIP ROT 10 11.4 M/L (STAPLE)
BENZOIN TINCTURE PRP APPL 2/3 (GAUZE/BANDAGES/DRESSINGS) ×2 IMPLANT
CANISTER SUCTION 2500CC (MISCELLANEOUS) ×2 IMPLANT
CHLORAPREP W/TINT 26ML (MISCELLANEOUS) ×2 IMPLANT
CLIP APPLIE 5 13 M/L LIGAMAX5 (MISCELLANEOUS) IMPLANT
CLIP APPLIE ROT 10 11.4 M/L (STAPLE) IMPLANT
CUTTER FLEX LINEAR 45M (STAPLE) IMPLANT
DECANTER SPIKE VIAL GLASS SM (MISCELLANEOUS) ×2 IMPLANT
DRAIN CHANNEL 19F RND (DRAIN) ×2 IMPLANT
DRAPE LAPAROSCOPIC ABDOMINAL (DRAPES) ×2 IMPLANT
DRSG TEGADERM 2-3/8X2-3/4 SM (GAUZE/BANDAGES/DRESSINGS) ×4 IMPLANT
DRSG TELFA 3X8 NADH (GAUZE/BANDAGES/DRESSINGS) ×2 IMPLANT
ELECT REM PT RETURN 9FT ADLT (ELECTROSURGICAL) ×2
ELECTRODE REM PT RTRN 9FT ADLT (ELECTROSURGICAL) ×1 IMPLANT
ENDOLOOP SUT PDS II  0 18 (SUTURE)
ENDOLOOP SUT PDS II 0 18 (SUTURE) IMPLANT
EVACUATOR SILICONE 100CC (DRAIN) ×2 IMPLANT
GLOVE ECLIPSE 8.0 STRL XLNG CF (GLOVE) ×2 IMPLANT
GLOVE INDICATOR 8.0 STRL GRN (GLOVE) ×2 IMPLANT
GOWN STRL REUS W/TWL XL LVL3 (GOWN DISPOSABLE) ×4 IMPLANT
KIT BASIN OR (CUSTOM PROCEDURE TRAY) ×2 IMPLANT
PAD ABD 8X10 STRL (GAUZE/BANDAGES/DRESSINGS) ×2 IMPLANT
POUCH SPECIMEN RETRIEVAL 10MM (ENDOMECHANICALS) ×2 IMPLANT
RELOAD 45 VASCULAR/THIN (ENDOMECHANICALS) IMPLANT
RELOAD STAPLE TA45 3.5 REG BLU (ENDOMECHANICALS) IMPLANT
RELOAD STAPLER LINEAR PROX 30 (STAPLE) ×1 IMPLANT
SET IRRIG TUBING LAPAROSCOPIC (IRRIGATION / IRRIGATOR) ×2 IMPLANT
SHEARS HARMONIC ACE PLUS 36CM (ENDOMECHANICALS) ×2 IMPLANT
SLEEVE XCEL OPT CAN 5 100 (ENDOMECHANICALS) ×2 IMPLANT
SOLUTION ANTI FOG 6CC (MISCELLANEOUS) ×2 IMPLANT
SPONGE GAUZE 4X4 12PLY (GAUZE/BANDAGES/DRESSINGS) ×2 IMPLANT
SPONGE LAP 18X18 X RAY DECT (DISPOSABLE) ×4 IMPLANT
STAPLER RELOAD LINEAR PROX 30 (STAPLE) ×2
STAPLER VISISTAT 35W (STAPLE) ×2 IMPLANT
STRIP CLOSURE SKIN 1/2X4 (GAUZE/BANDAGES/DRESSINGS) ×2 IMPLANT
SUT ETHILON 3 0 PS 1 (SUTURE) ×2 IMPLANT
SUT MNCRL AB 4-0 PS2 18 (SUTURE) ×2 IMPLANT
SUT VIC AB 3-0 SH 18 (SUTURE) ×2 IMPLANT
TAPE CLOTH SURG 4X10 WHT LF (GAUZE/BANDAGES/DRESSINGS) ×2 IMPLANT
TOWEL OR 17X26 10 PK STRL BLUE (TOWEL DISPOSABLE) ×2 IMPLANT
TOWEL OR NON WOVEN STRL DISP B (DISPOSABLE) ×2 IMPLANT
TRAY FOLEY CATH 14FRSI W/METER (CATHETERS) ×2 IMPLANT
TRAY LAP CHOLE (CUSTOM PROCEDURE TRAY) ×2 IMPLANT
TROCAR BLADELESS OPT 5 100 (ENDOMECHANICALS) ×2 IMPLANT
TROCAR XCEL 12X100 BLDLESS (ENDOMECHANICALS) ×2 IMPLANT
TROCAR XCEL BLUNT TIP 100MML (ENDOMECHANICALS) ×2 IMPLANT
TROCAR XCEL NON-BLD 5MMX100MML (ENDOMECHANICALS) ×2 IMPLANT
TUBING INSUFFLATION 10FT LAP (TUBING) ×2 IMPLANT
YANKAUER SUCT BULB TIP 10FT TU (MISCELLANEOUS) ×2 IMPLANT

## 2014-06-06 NOTE — Anesthesia Postprocedure Evaluation (Signed)
  Anesthesia Post-op Note  Patient: [name] [name]  Procedure(s) Performed: Procedure(s) (LRB): APPENDECTOMY LAPAROSCOPIC attemted (N/A) APPENDECTOMY (N/A)  Patient Location: PACU  Anesthesia Type: General  Level of Consciousness: awake and alert   Airway and Oxygen Therapy: Patient Spontanous Breathing  Post-op Pain: mild  Post-op Assessment: Post-op Vital signs reviewed, Patient's Cardiovascular Status Stable, Respiratory Function Stable, Patent Airway and No signs of Nausea or vomiting  Last Vitals:  Filed Vitals:   06/06/14 1038  BP: 162/83  Pulse: 99  Temp: 36.6 C  Resp: 24    Post-op Vital Signs: stable   Complications: No apparent anesthesia complications

## 2014-06-06 NOTE — Progress Notes (Addendum)
    Subjective:  Starting to have some recurrent lower abdominal pain. No chest pain or dyspnea.   Objective:  Vital Signs in the last 24 hours: Temp:  [97.4 F (36.3 C)-98.3 F (36.8 C)] 98.1 F (36.7 C) (06/08 0700) Pulse Rate:  [64-86] 74 (06/08 0700) Resp:  [18-20] 20 (06/08 0700) BP: (74-131)/(48-74) 115/71 mmHg (06/08 0700) SpO2:  [92 %-96 %] 92 % (06/08 0700)  Intake/Output from previous day: 06/07 0701 - 06/08 0700 In: 250 [IV Piggyback:250] Out: 1075 [Urine:1075]  Physical Exam: Pt is alert and oriented, NAD HEENT: normal Neck: JVP - normal Lungs: CTA bilaterally CV: RRR without murmur or gallop Abd: soft, obese Ext: no C/C/E, distal pulses intact and equal Skin: warm/dry no rash   Lab Results:  Recent Labs  06/05/14 0518 06/06/14 0422  WBC 22.6* 18.0*  HGB 16.0 15.3  PLT 125* 118*    Recent Labs  06/05/14 0518 06/06/14 0422  NA 137 139  K 4.7 4.6  CL 100 104  CO2 22 23  GLUCOSE 147* 126*  BUN 17 25*  CREATININE 1.62* 1.63*   No results found for this basename: TROPONINI, CK, MB,  in the last 72 hours  Tele: Sinus rhythm  Assessment/Plan:  1. Acute appendicitis 2. PAF - maintaining sinus on Tikosyn  Plans for surgery today. Has been off Pradaxa now 48 hours. Rhythm stable. Will repeat EKG in am to eval QTc, but abx now switched from Cipro to Zosyn. Reduce Tikosyn dose to 250 mcg BID in setting renal insufficiency.   Sherren Mocha, M.D. 06/06/2014, 7:30 AM

## 2014-06-06 NOTE — Progress Notes (Signed)
Subjective: Pain is controlled.  Objective: Vital signs in last 24 hours: Temp:  [97.4 F (36.3 C)-98.3 F (36.8 C)] 98.1 F (36.7 C) (06/08 0700) Pulse Rate:  [64-86] 74 (06/08 0700) Resp:  [18-20] 20 (06/08 0700) BP: (74-131)/(48-74) 115/71 mmHg (06/08 0700) SpO2:  [92 %-96 %] 92 % (06/08 0700) Last BM Date: 06/03/14  Intake/Output from previous day: 06/07 0701 - 06/08 0700 In: 2487.5 [I.V.:2237.5; IV Piggyback:250] Out: 1610 [Urine:1075] Intake/Output this shift:    PE: General- In NAD Abdomen-soft, RLQ and right midabdominal tenderness  Lab Results:   Recent Labs  06/05/14 0518 06/06/14 0422  WBC 22.6* 18.0*  HGB 16.0 15.3  HCT 46.2 45.2  PLT 125* 118*   BMET  Recent Labs  06/05/14 0518 06/06/14 0422  NA 137 139  K 4.7 4.6  CL 100 104  CO2 22 23  GLUCOSE 147* 126*  BUN 17 25*  CREATININE 1.62* 1.63*  CALCIUM 8.7 8.7   PT/INR  Recent Labs  06/05/14 0518  LABPROT 15.6*  INR 1.27   Comprehensive Metabolic Panel:    Component Value Date/Time   NA 139 06/06/2014 0422   NA 137 06/05/2014 0518   K 4.6 06/06/2014 0422   K 4.7 06/05/2014 0518   CL 104 06/06/2014 0422   CL 100 06/05/2014 0518   CO2 23 06/06/2014 0422   CO2 22 06/05/2014 0518   BUN 25* 06/06/2014 0422   BUN 17 06/05/2014 0518   CREATININE 1.63* 06/06/2014 0422   CREATININE 1.62* 06/05/2014 0518   GLUCOSE 126* 06/06/2014 0422   GLUCOSE 147* 06/05/2014 0518   CALCIUM 8.7 06/06/2014 0422   CALCIUM 8.7 06/05/2014 0518   AST 24 06/04/2014 0935   AST 32 06/11/2013 0754   ALT 43 06/04/2014 0935   ALT 57* 06/11/2013 0754   ALKPHOS 62 06/04/2014 0935   ALKPHOS 59 06/11/2013 0754   BILITOT 1.5* 06/04/2014 0935   BILITOT 1.4* 06/11/2013 0754   PROT 7.1 06/04/2014 0935   PROT 6.7 06/11/2013 0754   ALBUMIN 4.3 06/04/2014 0935   ALBUMIN 4.1 06/11/2013 0754     Studies/Results: Ct Abdomen Pelvis W Contrast  06/04/2014   CLINICAL DATA:  Right upper quadrant pain for 3 days with nausea.  EXAM: CT ABDOMEN AND PELVIS WITH  CONTRAST  TECHNIQUE: Multidetector CT imaging of the abdomen and pelvis was performed using the standard protocol following bolus administration of intravenous contrast.  CONTRAST:  47mL OMNIPAQUE IOHEXOL 300 MG/ML SOLN, 135mL OMNIPAQUE IOHEXOL 300 MG/ML SOLN  COMPARISON:  06/12/2012 abdominal MRI.  No prior CT.  FINDINGS: Lower Chest: Bibasilar centrilobular emphysema with probable scarring. Minimal pleural/ subpleural thickening on image 5/series 2 is favored to represent an area of scarring. 6 mm. Mild cardiomegaly with coronary artery atherosclerosis. Small hiatal hernia.  Abdomen/Pelvis: Normal liver, spleen, distal stomach, pancreas. 1.9 cm gallstone without acute cholecystitis or biliary ductal dilatation. Normal adrenal glands. Interpolar right renal cyst of 4.3 cm. Too small to characterize interpolar right renal lesion. Normal left kidney.  Atherosclerosis at the origin of bilateral renal arteries. No retroperitoneal or retrocrural adenopathy. Scattered colonic diverticula. Enlarged inflamed appendix identified on image 46. No evidence of surrounding abscess or free perforation.  Normal small bowel.  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 No pelvic adenopathy. Normal urinary bladder and prostate. No significant free fluid.  Bones/Musculoskeletal: Degenerative disc disease including at L4 through S1.  IMPRESSION: 1. Uncomplicated appendicitis. These results were called by telephone at the time  of interpretation on 06/04/2014 at 11:51 AM to Dr. Shea Evans, Aline Brochure , who verbally acknowledged these results. 2. Cholelithiasis. 3. Right common iliac artery aneurysm, 2.1 cm. 4. Probable pleural based scarring in the right lung base. Given the concurrent centrilobular emphysema, follow-up chest CT at 6 - 12 months is recommended. This recommendation follows the consensus statement: "Guidelines for Management of Small Pulmonary Nodules Detected on CT Scans: A Statement from the [place]"  as published in Radiology2005;237:395-400. Online at: [email or web address]/.   Electronically Signed   By: Abigail Miyamoto M.D.   On: 06/04/2014 11:51    Anti-infectives: Anti-infectives   Start     Dose/Rate Route Frequency Ordered Stop   06/04/14 1800  piperacillin-tazobactam (ZOSYN) IVPB 3.375 g     3.375 g 12.5 mL/hr over 240 Minutes Intravenous Every 8 hours 06/04/14 1655     06/04/14 1200  ciprofloxacin (CIPRO) IVPB 400 mg     400 mg 200 mL/hr over 60 Minutes Intravenous  Once 06/04/14 1154 06/04/14 1300   06/04/14 1200  metroNIDAZOLE (FLAGYL) IVPB 500 mg     500 mg 100 mL/hr over 60 Minutes Intravenous  Once 06/04/14 1154 06/04/14 1409      Assessment 1. Acute Appendicitis-on IV abxs, WBC down some 2. Anti coagulated on Pradaxa-held for 48 hours and felt to be safe to proceed with appendectomy today. 3. Thrombocytopenia-platelet count 118,000 4. CAD s/p stent to LAD in 2003  Spoke to Dr. Wells Guiles about seeing the patient for card clearance. He thought Mr. Wiedeman did not need transitional anticoagulation.  5. A fib - On Tikosyn.  6. Gallstones  7. Right common iliac art aneurysm  8. Right lung base scarring- Follow up CT scan in 6 to 12 months  9. OSA - ion CPAP  10. Diabetes -Last Hgb A1C - 7.9 - 03/22/2014 11.  Acute kidney injury-Cr 1.63 today      LOS: 2 days   Plan: Proceed with laparoscopic, possible open appendectomy today.  I have discussed the procedure and risks of appendectomy. The risks include but are not limited to bleeding (increased in him), infection, wound problems, anesthesia, injury to intra-abdominal organs.  He seems to understand and agrees with the plan.   [name] [name] [name] [name] 06/06/2014

## 2014-06-06 NOTE — Progress Notes (Signed)
At 2253, assisted pt with cpap for rest.  Placed pt on 15cm h2o per chart settings with 6l o2 bleedin.  Pt stated this pressure felt better.  Sterile water added to humidity chamber.  HR71, bp130/66, sats 93%.

## 2014-06-06 NOTE — Op Note (Signed)
Operative Note  [name] [name] male 63 y.o. 06/06/2014  PREOPERATIVE DX:  Acute appendicitis  POSTOPERATIVE DX:  Acute perforated appendicitis with necrosis  PROCEDURE:  Laparoscopy converted to minilaparotomy with appendectomy         Surgeon: Odis Hollingshead   Assistants: Saverio Danker, PA  Anesthesia: General endotracheal anesthesia  Indications:   This is a 63 year old male admitted 2 days ago with acute appendicitis. However, he was on Pradaxa.  He was placed on IV antibiotics and Pradaxa has been held for 48 hours. He now presents for appendectomy.    Procedure Detail:  He was brought to the operating room placed supine on the operating table and a general anesthetic was given. A Foley catheter was inserted. Hair and the abdominal wall was clipped. The abdominal wall was widely sterilely prepped and draped.  A 5 mm incision was made in the left upper quadrant region. He was placed in slight reverse Trendelenburg. Using a 5 mm Optiview trocar laparoscope access was gained into the peritoneal cavity. A pneumoperitoneum was created. Inspection of the area underneath the trocar demonstrated no evidence of bleeding organ injury. Adhesions between the omentum and the periumbilical piece of mesh were noted. The cecum was in the right upper quadrant area. An inflammatory process was adjacent to that. A 5 mm trocar was placed to the left of the midline. Again manipulating the cecum. I saw the inflammatory process but could not definitively identify the appendix. Another 5 mm trocar was placed in the epigastric region just to the left of the midline. I removed the 5 mm trocar inferior to this one and replaced with a 12 mm trocar. A 5 mm trocar was then placed in the right lower quadrant.  I lysed some adhesions between the omentum and mesh to allow for better visualization. I rotated the cecum medially and noted the inflammatory process and recognized that this was a contained perforation of  the appendix. Necrotic tissue was identified. I continued to do some blunt and hydrodissection but I could not definitively identify the appendix.  Subsequently, a limited right upper quadrant incision was made through the skin fascia muscle and peritoneum entering the peritoneal cavity. A file there was noted. I mobilized the cecum and proximal right colon by dividing its lateral attachments. I identified the ileocecal valve. There was a necrotic appendix tracking posteriorly and medially into it an abscess type cavity were necrotic tissue was noted. I carefully bluntly mobilize the appendix up into the wound. I divided the mesoappendix with the harmonic scalpel. The site of perforation was noted. I amputated the appendix off the cecum, with a cuff of cecum, using the linear noncutting stapling. I imbricated the staple line with 3-0 Vicryl sutures.  I then inspected the cavity where the appendix was divided carefully debride some of the necrotic tissue. I copiously irrigated this area out with saline.  I inspected the area and there is no evidence of bleeding or intestinal leak. A size 19 Blake drain was placed through the right lower quadrant trocar incision. This was placed into the abscess type cavity posteromedially. It was anchored to the skin with 3-0 nylon suture.  The right upper quadrant fascia was then closed in 2 layers with running #1 PDS suture. Trocars were removed. Subcutaneous tissue was irrigated. Trocar sites were closed with staples. The minilaparotomy site was closed with staples leaving a gap between them.  Telfa wicks were placed in the gap. The drain was placed to bulb suction.  Sterile dressings were applied.  He tolerated the procedure well without any apparent complications. He was taken to the recovery room in satisfactory condition. He is at risk for a postoperative ileus.      Findings:  There is contained perforation of a necrotic appendix. There was a necrotic cavity  posterior and medial to the base of the appendix.  Estimated Blood Loss:  300 mL         Drains: #19 Blake drain    Blood Given: none          Specimens:   Appendix        Complications:  * No complications entered in OR log *         Disposition: PACU - hemodynamically stable.         Condition: stable

## 2014-06-06 NOTE — Progress Notes (Signed)
Placed pt on cpap with same settings from PACU w 6L bleed in

## 2014-06-06 NOTE — Anesthesia Preprocedure Evaluation (Addendum)
Anesthesia Evaluation  Patient identified by MRN, date of birth, ID band Patient awake    Reviewed: Allergy & Precautions, H&P , NPO status , Patient's Chart, lab work & pertinent test results  Airway Mallampati: III TM Distance: >3 FB Neck ROM: full    Dental  (+) Caps, Dental Advisory Given Cap right upper front:   Pulmonary sleep apnea and Continuous Positive Airway Pressure Ventilation , former smoker,  breath sounds clear to auscultation  Pulmonary exam normal       Cardiovascular Exercise Tolerance: Good + CAD, + Past MI and + Cardiac Stents + dysrhythmias Atrial Fibrillation Rhythm:regular Rate:Normal  myoview 3/13 no ischemia EF 51%. Palpitations. ECG - NSR, prolonged QT   Neuro/Psych negative neurological ROS  negative psych ROS   GI/Hepatic negative GI ROS, Neg liver ROS, GERD-  Medicated and Controlled,  Endo/Other  diabetes, Well Controlled, Type 2, Oral Hypoglycemic AgentsMorbid obesity  Renal/GU negative Renal ROS  negative genitourinary   Musculoskeletal   Abdominal (+) + obese,   Peds  Hematology negative hematology ROS (+)   Anesthesia Other Findings   Reproductive/Obstetrics negative OB ROS                       Anesthesia Physical Anesthesia Plan  ASA: III  Anesthesia Plan: General   Post-op Pain Management:    Induction: Intravenous, Rapid sequence and Cricoid pressure planned  Airway Management Planned: Oral ETT  Additional Equipment:   Intra-op Plan:   Post-operative Plan: Extubation in OR  Informed Consent: I have reviewed the patients History and Physical, chart, labs and discussed the procedure including the risks, benefits and alternatives for the proposed anesthesia with the patient or authorized representative who has indicated his/her understanding and acceptance.   Dental Advisory Given  Plan Discussed with: CRNA and Surgeon  Anesthesia Plan Comments:          Anesthesia Quick Evaluation

## 2014-06-06 NOTE — Transfer of Care (Signed)
Immediate Anesthesia Transfer of Care Note  Patient: [name] [name]  Procedure(s) Performed: Procedure(s): APPENDECTOMY LAPAROSCOPIC attemted (N/A) APPENDECTOMY (N/A)  Patient Location: PACU  Anesthesia Type:General  Level of Consciousness: awake, alert  and oriented  Airway & Oxygen Therapy: Patient Spontanous Breathing and Patient connected to face mask oxygen  Post-op Assessment: Report given to PACU RN and Post -op Vital signs reviewed and stable  Post vital signs: Reviewed and stable  Complications: No apparent anesthesia complications

## 2014-06-07 ENCOUNTER — Encounter (HOSPITAL_COMMUNITY): Payer: Self-pay | Admitting: General Surgery

## 2014-06-07 DIAGNOSIS — N179 Acute kidney failure, unspecified: Secondary | ICD-10-CM

## 2014-06-07 DIAGNOSIS — E119 Type 2 diabetes mellitus without complications: Secondary | ICD-10-CM

## 2014-06-07 DIAGNOSIS — G4733 Obstructive sleep apnea (adult) (pediatric): Secondary | ICD-10-CM

## 2014-06-07 LAB — BASIC METABOLIC PANEL
BUN: 19 mg/dL (ref 6–23)
CO2: 26 mEq/L (ref 19–32)
Calcium: 8.6 mg/dL (ref 8.4–10.5)
Chloride: 104 mEq/L (ref 96–112)
Creatinine, Ser: 1.18 mg/dL (ref 0.50–1.35)
GFR calc Af Amer: 74 mL/min — ABNORMAL LOW (ref >90)
GFR calc non Af Amer: 63 mL/min — ABNORMAL LOW (ref >90)
Glucose, Bld: 100 mg/dL — ABNORMAL HIGH (ref 70–99)
Potassium: 4.4 mEq/L (ref 3.7–5.3)
Sodium: 139 mEq/L (ref 137–147)

## 2014-06-07 LAB — MAGNESIUM: Magnesium: 2.3 mg/dL (ref 1.5–2.5)

## 2014-06-07 LAB — GLUCOSE, CAPILLARY
Glucose-Capillary: 105 mg/dL — ABNORMAL HIGH (ref 70–99)
Glucose-Capillary: 90 mg/dL (ref 70–99)
Glucose-Capillary: 90 mg/dL (ref 70–99)
Glucose-Capillary: 93 mg/dL (ref 70–99)
Glucose-Capillary: 96 mg/dL (ref 70–99)
Glucose-Capillary: 96 mg/dL (ref 70–99)
Glucose-Capillary: 98 mg/dL (ref 70–99)

## 2014-06-07 MED ORDER — MORPHINE SULFATE (PF) 1 MG/ML IV SOLN
INTRAVENOUS | Status: AC
  Administered 2014-06-07: 3 mg
  Filled 2014-06-07: qty 25

## 2014-06-07 MED ORDER — MORPHINE SULFATE (PF) 1 MG/ML IV SOLN: INTRAVENOUS | Status: DC

## 2014-06-07 MED ORDER — MORPHINE SULFATE (PF) 1 MG/ML IV SOLN
INTRAVENOUS | Status: DC
  Administered 2014-06-07: 12 mg via INTRAVENOUS
  Administered 2014-06-07: 3.49 mg via INTRAVENOUS
  Administered 2014-06-07: 0.6 mg via INTRAVENOUS
  Administered 2014-06-07: via INTRAVENOUS
  Administered 2014-06-07: 6 mg via INTRAVENOUS
  Administered 2014-06-08 (×2): 1.5 mg via INTRAVENOUS
  Administered 2014-06-08: 1 mg via INTRAVENOUS
  Administered 2014-06-08: 3 mg via INTRAVENOUS
  Administered 2014-06-08: 4.5 mg via INTRAVENOUS
  Administered 2014-06-08: 1.5 mg via INTRAVENOUS
  Administered 2014-06-08 – 2014-06-09 (×2): 7.5 mg via INTRAVENOUS
  Administered 2014-06-09: 1.5 mg via INTRAVENOUS
  Administered 2014-06-09: 3 mg via INTRAVENOUS
  Administered 2014-06-09 (×2): 1.5 mg via INTRAVENOUS
  Administered 2014-06-10: 4.5 mg via INTRAVENOUS
  Administered 2014-06-10: 02:00:00 via INTRAVENOUS
  Administered 2014-06-10: 4.5 mg via INTRAVENOUS
  Administered 2014-06-10: 3 mg via INTRAVENOUS
  Administered 2014-06-10: 4.5 mg via INTRAVENOUS
  Filled 2014-06-07 (×3): qty 25

## 2014-06-07 MED ORDER — SODIUM CHLORIDE 0.9 % IJ SOLN: 9.0000 mL | INTRAMUSCULAR | Status: DC | PRN

## 2014-06-07 MED ORDER — ONDANSETRON HCL 4 MG/2ML IJ SOLN: 4.0000 mg | Freq: Four times a day (QID) | INTRAMUSCULAR | Status: DC | PRN

## 2014-06-07 MED ORDER — NALOXONE HCL 0.4 MG/ML IJ SOLN: 0.4000 mg | INTRAMUSCULAR | Status: DC | PRN

## 2014-06-07 MED ORDER — DIPHENHYDRAMINE HCL 50 MG/ML IJ SOLN: 12.5000 mg | Freq: Four times a day (QID) | INTRAMUSCULAR | Status: DC | PRN

## 2014-06-07 MED ORDER — ONDANSETRON HCL 4 MG/2ML IJ SOLN
4.0000 mg | Freq: Four times a day (QID) | INTRAMUSCULAR | Status: DC | PRN
  Administered 2014-06-07: 4 mg via INTRAVENOUS

## 2014-06-07 MED ORDER — DIPHENHYDRAMINE HCL 12.5 MG/5ML PO ELIX: 12.5000 mg | ORAL_SOLUTION | Freq: Four times a day (QID) | ORAL | Status: DC | PRN

## 2014-06-07 NOTE — Progress Notes (Addendum)
1 Day Post-Op  Subjective: Some nausea and bloating.  No flatus.  Details of operation discussed with him.   Objective: Vital signs in last 24 hours: Temp:  [97.6 F (36.4 C)-98.4 F (36.9 C)] 98 F (36.7 C) (06/09 0400) Pulse Rate:  [54-99] 54 (06/09 0700) Resp:  [7-24] 16 (06/09 0754) BP: (108-162)/(55-89) 139/70 mmHg (06/09 0700) SpO2:  [6 %-99 %] 6 % (06/09 0754) Last BM Date: 06/03/14  Intake/Output from previous day: 06/08 0701 - 06/09 0700 In: 4717.5 [I.V.:4617.5; IV Piggyback:100] Out: 2655 [Urine:2200; Drains:355; Blood:100] Intake/Output this shift:    PE: General- In NAD Abdomen-soft, obese, few bowel sounds, thin serous drain output, dressings with dried drainage on them.  Lab Results:   Recent Labs  06/05/14 0518 06/06/14 0422  WBC 22.6* 18.0*  HGB 16.0 15.3  HCT 46.2 45.2  PLT 125* 118*   BMET  Recent Labs  06/06/14 0422 06/07/14 0310  NA 139 139  K 4.6 4.4  CL 104 104  CO2 23 26  GLUCOSE 126* 100*  BUN 25* 19  CREATININE 1.63* 1.18  CALCIUM 8.7 8.6   PT/INR  Recent Labs  06/05/14 0518  LABPROT 15.6*  INR 1.27   Comprehensive Metabolic Panel:    Component Value Date/Time   NA 139 06/07/2014 0310   NA 139 06/06/2014 0422   K 4.4 06/07/2014 0310   K 4.6 06/06/2014 0422   CL 104 06/07/2014 0310   CL 104 06/06/2014 0422   CO2 26 06/07/2014 0310   CO2 23 06/06/2014 0422   BUN 19 06/07/2014 0310   BUN 25* 06/06/2014 0422   CREATININE 1.18 06/07/2014 0310   CREATININE 1.63* 06/06/2014 0422   GLUCOSE 100* 06/07/2014 0310   GLUCOSE 126* 06/06/2014 0422   CALCIUM 8.6 06/07/2014 0310   CALCIUM 8.7 06/06/2014 0422   AST 24 06/04/2014 0935   AST 32 06/11/2013 0754   ALT 43 06/04/2014 0935   ALT 57* 06/11/2013 0754   ALKPHOS 62 06/04/2014 0935   ALKPHOS 59 06/11/2013 0754   BILITOT 1.5* 06/04/2014 0935   BILITOT 1.4* 06/11/2013 0754   PROT 7.1 06/04/2014 0935   PROT 6.7 06/11/2013 0754   ALBUMIN 4.3 06/04/2014 0935   ALBUMIN 4.1 06/11/2013 0754     Studies/Results: No  results found.  Anti-infectives: Anti-infectives   Start     Dose/Rate Route Frequency Ordered Stop   06/04/14 1800  piperacillin-tazobactam (ZOSYN) IVPB 3.375 g     3.375 g 12.5 mL/hr over 240 Minutes Intravenous Every 8 hours 06/04/14 1655     06/04/14 1200  ciprofloxacin (CIPRO) IVPB 400 mg     400 mg 200 mL/hr over 60 Minutes Intravenous  Once 06/04/14 1154 06/04/14 1300   06/04/14 1200  metroNIDAZOLE (FLAGYL) IVPB 500 mg     500 mg 100 mL/hr over 60 Minutes Intravenous  Once 06/04/14 1154 06/04/14 1409      Assessment Principal Problem:   Perforated appendicitis with necrosis s/p open appendectomy 06/07/14 Active Problems:   OSA-on CPAP at night and as needed.   Atrial fibrillation   Chronic anticoagulation   Acute kidney injury-improved.   Type II DM-CBG 93-96    LOS: 3 days   Plan: Transfer to telemetry.  OOB.  D/C foley.   Rhunette Croft Asanti Craigo 06/07/2014

## 2014-06-07 NOTE — Progress Notes (Signed)
    Subjective:  No chest pain, dyspnea, or palps. Expected soreness lower abdomen, but overall doing well.  Objective:  Vital Signs in the last 24 hours: Temp:  [97.6 F (36.4 C)-98.4 F (36.9 C)] 98 F (36.7 C) (06/09 0400) Pulse Rate:  [58-99] 59 (06/09 0500) Resp:  [7-24] 10 (06/09 0500) BP: (108-162)/(55-89) 120/69 mmHg (06/09 0300) SpO2:  [85 %-99 %] 89 % (06/09 0500)  Intake/Output from previous day: 06/08 0701 - 06/09 0700 In: 4267.5 [I.V.:4167.5; IV Piggyback:100] Out: 2385 [Urine:1950; Drains:335; Blood:100]  Physical Exam: Pt is alert and oriented, NAD HEENT: normal Neck: JVP - normal Lungs: CTA bilaterally CV: RRR without murmur or gallop Abd: soft Ext: no C/C/E, distal pulses intact and equal Skin: warm/dry no rash   Lab Results:  Recent Labs  06/05/14 0518 06/06/14 0422  WBC 22.6* 18.0*  HGB 16.0 15.3  PLT 125* 118*    Recent Labs  06/06/14 0422 06/07/14 0310  NA 139 139  K 4.6 4.4  CL 104 104  CO2 23 26  GLUCOSE 126* 100*  BUN 25* 19  CREATININE 1.63* 1.18   No results found for this basename: TROPONINI, CK, MB,  in the last 72 hours  Cardiac Studies: EKG - sinus rhythm, incomplete RBBB, QTc 488  Tele: Sinus rhythm, no arrhythmia noted  Assessment/Plan:  1. Acute appendicitis - POD #1 appears to be recovering well 2. PAF - maintaining sinus rhythm on reduced Tikosyn dose  QT improved, no arrhythmia noted. Recommend resume pradaxa when safe from post-op perspective. Will continue reduced dose Tikosyn today and repeat EKG in am - likely resume 500 mcg BID tomorrow.   Sherren Mocha, M.D. 06/07/2014, 6:44 AM

## 2014-06-08 ENCOUNTER — Other Ambulatory Visit: Payer: Self-pay

## 2014-06-08 LAB — CBC
HCT: 41 % (ref 39.0–52.0)
Hemoglobin: 13.8 g/dL (ref 13.0–17.0)
MCH: 31 pg (ref 26.0–34.0)
MCHC: 33.7 g/dL (ref 30.0–36.0)
MCV: 92.1 fL (ref 78.0–100.0)
Platelets: 131 10*3/uL — ABNORMAL LOW (ref 150–400)
RBC: 4.45 MIL/uL (ref 4.22–5.81)
RDW: 13.4 % (ref 11.5–15.5)
WBC: 9.6 10*3/uL (ref 4.0–10.5)

## 2014-06-08 LAB — BASIC METABOLIC PANEL
BUN: 15 mg/dL (ref 6–23)
CO2: 26 mEq/L (ref 19–32)
Calcium: 8.7 mg/dL (ref 8.4–10.5)
Chloride: 104 mEq/L (ref 96–112)
Creatinine, Ser: 0.93 mg/dL (ref 0.50–1.35)
GFR calc Af Amer: >90 mL/min (ref >90)
GFR calc non Af Amer: 87 mL/min — ABNORMAL LOW (ref >90)
Glucose, Bld: 92 mg/dL (ref 70–99)
Potassium: 3.9 mEq/L (ref 3.7–5.3)
Sodium: 141 mEq/L (ref 137–147)

## 2014-06-08 LAB — GLUCOSE, CAPILLARY
Glucose-Capillary: 100 mg/dL — ABNORMAL HIGH (ref 70–99)
Glucose-Capillary: 90 mg/dL (ref 70–99)
Glucose-Capillary: 93 mg/dL (ref 70–99)
Glucose-Capillary: 95 mg/dL (ref 70–99)
Glucose-Capillary: 95 mg/dL (ref 70–99)
Glucose-Capillary: 99 mg/dL (ref 70–99)

## 2014-06-08 MED ORDER — POTASSIUM CHLORIDE CRYS ER 20 MEQ PO TBCR
20.0000 meq | EXTENDED_RELEASE_TABLET | Freq: Two times a day (BID) | ORAL | Status: DC
  Administered 2014-06-08 – 2014-06-13 (×11): 20 meq via ORAL
  Filled 2014-06-08 (×13): qty 1

## 2014-06-08 MED ORDER — DOFETILIDE 500 MCG PO CAPS
500.0000 ug | ORAL_CAPSULE | Freq: Two times a day (BID) | ORAL | Status: DC
  Administered 2014-06-08 – 2014-06-13 (×10): 500 ug via ORAL
  Filled 2014-06-08 (×12): qty 1

## 2014-06-08 NOTE — Progress Notes (Signed)
RT went by and checked to see if Pt was ready for CPAP. Pt stated that he would self administer when ready.  RT filled humidity chamber and reminded Pt to call and let RN or RT know when he was ready one of [place] could connect O2 to the CPAP.  Current CPAP settings are 15 [name]2O with 4 LPM O2 bleed in. RT to monitor and assess as needed.

## 2014-06-08 NOTE — Progress Notes (Signed)
ANTIBIOTIC CONSULT NOTE - FOLLOW UP  Pharmacy Consult for Zosyn Indication: perforated appendicitis with necrosis   Allergies  Allergen Reactions  . Penicillins     Childhood reaction   . Procaine Hcl Hives and Other (See Comments)    Red in the face and neck   . Pseudoephedrine Other (See Comments)    Patient went into afib  . Sulfonamide Derivatives     Childhood reaction     Patient Measurements: Height: 6\' 1"  (185.4 cm) Weight: 325 lb 8 oz (147.646 kg) IBW/kg (Calculated) : 79.9  Vital Signs: Temp: 98.3 F (36.8 C) (06/09 2058) Temp src: Oral (06/09 2058) BP: 113/63 mmHg (06/09 2058) Pulse Rate: 63 (06/09 2058) Intake/Output from previous day: 06/09 0701 - 06/10 0700 In: 3743.7 [I.V.:3643.7; IV Piggyback:100] Out: 2053 [Urine:1970; Drains:83]  Labs:  Recent Labs  06/06/14 0422 06/07/14 0310 06/08/14 0346  WBC 18.0*  --  9.6  HGB 15.3  --  13.8  PLT 118*  --  131*  CREATININE 1.63* 1.18 0.93   Estimated Creatinine Clearance: 121.4 ml/min (by C-G formula based on Cr of 0.93).  Microbiology: Recent Results (from the past 720 hour(s))  SURGICAL PCR SCREEN     Status: None   Collection Time    06/06/14  7:05 AM      Result Value Ref Range Status   MRSA, PCR NEGATIVE  NEGATIVE Final   Staphylococcus aureus NEGATIVE  NEGATIVE Final   Comment:            The Xpert SA Assay (FDA     approved for NASAL specimens     in patients over 43 years of age),     is one component of     a comprehensive surveillance     program.  Test performance has     been validated by [employer] for patients greater     than or equal to 22 year old.     It is not intended     to diagnose infection nor to     guide or monitor treatment.  MRSA PCR SCREENING     Status: None   Collection Time    06/06/14 12:17 PM      Result Value Ref Range Status   MRSA by PCR NEGATIVE  NEGATIVE Final   Comment:            The GeneXpert MRSA Assay (FDA     approved for NASAL  specimens     only), is one component of a     comprehensive MRSA colonization     surveillance program. It is not     intended to diagnose MRSA     infection nor to guide or     monitor treatment for     MRSA infections.    Anti-infectives   Start     Dose/Rate Route Frequency Ordered Stop   06/04/14 1800  piperacillin-tazobactam (ZOSYN) IVPB 3.375 g     3.375 g 12.5 mL/hr over 240 Minutes Intravenous Every 8 hours 06/04/14 1655     06/04/14 1200  ciprofloxacin (CIPRO) IVPB 400 mg     400 mg 200 mL/hr over 60 Minutes Intravenous  Once 06/04/14 1154 06/04/14 1300   06/04/14 1200  metroNIDAZOLE (FLAGYL) IVPB 500 mg     500 mg 100 mL/hr over 60 Minutes Intravenous  Once 06/04/14 1154 06/04/14 1409      Assessment: 63 yoM with PMHx CAD, Afib on pradaxa  and tikosyn, HLD, obesity, OSA, and thrombocytopenia presents to [facility] with appendicitis.  Surgery planned tentatively for 6/8 d/t washout period for pradaxa (~48 hours).  Pt with allergy to penicillins (rxn unknown).  Discussed with MD who would like to do trial of zosyn and try to avoid cipro due to DDI with tikosyn.  S/p appendectomy on 6/8 for acute perforated appendicitis with necrosis.  Day #4 Zosyn  Tmax: Afebrile  WBCs: decreased to wnl, 9  Renal: SCr 0.93 with CrCl > 100 ml/min CG  Goal of Therapy:  Appropriate abx dosing, eradication of infection.   Plan:   Continue Zosyn 3.375g IV Q8H infused over 4hrs.  Follow up renal function, adjust dose as needed.  Gretta Arab PharmD, BCPS Pager [phone] 06/08/2014 8:09 AM

## 2014-06-08 NOTE — Progress Notes (Signed)
RN assisted patient with CPAP. Patient setting is 15 [name]2O. 4 liters oxygen is bleed into tubing.RT will monitor as needed.

## 2014-06-08 NOTE — Progress Notes (Signed)
2 Days Post-Op  Subjective: No nausea.  No flatus or BM.   Objective: Vital signs in last 24 hours: Temp:  [97.7 F (36.5 C)-98.3 F (36.8 C)] 98.3 F (36.8 C) (06/09 2058) Pulse Rate:  [59-65] 63 (06/09 2058) Resp:  [12-16] 14 (06/10 0400) BP: (113-120)/(51-64) 113/63 mmHg (06/09 2058) SpO2:  [93 %-97 %] 96 % (06/10 0400) Last BM Date: 06/03/14  Intake/Output from previous day: 06/09 0701 - 06/10 0700 In: 3743.7 [I.V.:3643.7; IV Piggyback:100] Out: 2053 [Urine:1970; Drains:83] Intake/Output this shift:    PE: General- In NAD Abdomen-soft, obese, more bowel sounds today, thin serous drain output, wounds are clean; RUQ incision with some wicks in place Lab Results:   Recent Labs  06/06/14 0422 06/08/14 0346  WBC 18.0* 9.6  HGB 15.3 13.8  HCT 45.2 41.0  PLT 118* 131*   BMET  Recent Labs  06/07/14 0310 06/08/14 0346  NA 139 141  K 4.4 3.9  CL 104 104  CO2 26 26  GLUCOSE 100* 92  BUN 19 15  CREATININE 1.18 0.93  CALCIUM 8.6 8.7   PT/INR No results found for this basename: LABPROT, INR,  in the last 72 hours Comprehensive Metabolic Panel:    Component Value Date/Time   NA 141 06/08/2014 0346   NA 139 06/07/2014 0310   K 3.9 06/08/2014 0346   K 4.4 06/07/2014 0310   CL 104 06/08/2014 0346   CL 104 06/07/2014 0310   CO2 26 06/08/2014 0346   CO2 26 06/07/2014 0310   BUN 15 06/08/2014 0346   BUN 19 06/07/2014 0310   CREATININE 0.93 06/08/2014 0346   CREATININE 1.18 06/07/2014 0310   GLUCOSE 92 06/08/2014 0346   GLUCOSE 100* 06/07/2014 0310   CALCIUM 8.7 06/08/2014 0346   CALCIUM 8.6 06/07/2014 0310   AST 24 06/04/2014 0935   AST 32 06/11/2013 0754   ALT 43 06/04/2014 0935   ALT 57* 06/11/2013 0754   ALKPHOS 62 06/04/2014 0935   ALKPHOS 59 06/11/2013 0754   BILITOT 1.5* 06/04/2014 0935   BILITOT 1.4* 06/11/2013 0754   PROT 7.1 06/04/2014 0935   PROT 6.7 06/11/2013 0754   ALBUMIN 4.3 06/04/2014 0935   ALBUMIN 4.1 06/11/2013 0754     Studies/Results: No results  found.  Anti-infectives: Anti-infectives   Start     Dose/Rate Route Frequency Ordered Stop   06/04/14 1800  piperacillin-tazobactam (ZOSYN) IVPB 3.375 g     3.375 g 12.5 mL/hr over 240 Minutes Intravenous Every 8 hours 06/04/14 1655     06/04/14 1200  ciprofloxacin (CIPRO) IVPB 400 mg     400 mg 200 mL/hr over 60 Minutes Intravenous  Once 06/04/14 1154 06/04/14 1300   06/04/14 1200  metroNIDAZOLE (FLAGYL) IVPB 500 mg     500 mg 100 mL/hr over 60 Minutes Intravenous  Once 06/04/14 1154 06/04/14 1409      Assessment Principal Problem:   Perforated appendicitis with necrosis s/p open appendectomy 06/07/14-improving Active Problems:   OSA-on CPAP at night and as needed.   Atrial fibrillation   Chronic anticoagulation-on hold   Acute kidney injury-resolved   Type II DM-CBG 90-100    LOS: 4 days   Plan: Clear liquids.  Ambulate.  Continue drain and IV abxs.   [name] [name] J 06/08/2014

## 2014-06-08 NOTE — Progress Notes (Addendum)
    Subjective:  Feeling better. No CP or palps.   Objective:  Vital Signs in the last 24 hours: Temp:  [97.7 F (36.5 C)-98.3 F (36.8 C)] 98.2 F (36.8 C) (06/10 1305) Pulse Rate:  [59-63] 61 (06/10 1305) Resp:  [12-19] 16 (06/10 1305) BP: (113-135)/(51-80) 135/80 mmHg (06/10 1305) SpO2:  [93 %-97 %] 96 % (06/10 1305)  Intake/Output from previous day: 06/09 0701 - 06/10 0700 In: 3743.7 [I.V.:3643.7; IV Piggyback:100] Out: 2053 [Urine:1970; Drains:83]  Physical Exam: Pt is alert and oriented, NAD HEENT: normal Neck: JVP - normal Lungs: CTA bilaterally CV: RRR  Abd: soft Ext: no C/C/E, distal pulses intact and equal Skin: warm/dry no rash   Lab Results:  Recent Labs  06/06/14 0422 06/08/14 0346  WBC 18.0* 9.6  HGB 15.3 13.8  PLT 118* 131*    Recent Labs  06/07/14 0310 06/08/14 0346  NA 139 141  K 4.4 3.9  CL 104 104  CO2 26 26  GLUCOSE 100* 92  BUN 19 15  CREATININE 1.18 0.93   No results found for this basename: TROPONINI, CK, MB,  in the last 72 hours  Tele: Sinus rhythm - personally reviewed. No AF.   Assessment/Plan:  PAF - maintaining sinus rhythm post-operatively. QTc has normalized. Renal function has normalized. Will resume previous dose of Tikosyn 500 mcg BID. Would resume anticoagulation when post-op bleeding risk is acceptable. Replete K to keep over 4.0 and recheck BMET in am.  Sherren Mocha, M.D. 06/08/2014, 2:52 PM

## 2014-06-09 ENCOUNTER — Other Ambulatory Visit: Payer: Self-pay

## 2014-06-09 DIAGNOSIS — I4891 Unspecified atrial fibrillation: Secondary | ICD-10-CM

## 2014-06-09 LAB — GLUCOSE, CAPILLARY
Glucose-Capillary: 87 mg/dL (ref 70–99)
Glucose-Capillary: 106 mg/dL — ABNORMAL HIGH (ref 70–99)
Glucose-Capillary: 103 mg/dL — ABNORMAL HIGH (ref 70–99)
Glucose-Capillary: 91 mg/dL (ref 70–99)
Glucose-Capillary: 124 mg/dL — ABNORMAL HIGH (ref 70–99)

## 2014-06-09 LAB — BASIC METABOLIC PANEL
BUN: 12 mg/dL (ref 6–23)
CO2: 26 mEq/L (ref 19–32)
Calcium: 8.8 mg/dL (ref 8.4–10.5)
Chloride: 103 mEq/L (ref 96–112)
Creatinine, Ser: 0.84 mg/dL (ref 0.50–1.35)
GFR calc Af Amer: >90 mL/min (ref >90)
GFR calc non Af Amer: >90 mL/min (ref >90)
Glucose, Bld: 98 mg/dL (ref 70–99)
Potassium: 4 mEq/L (ref 3.7–5.3)
Sodium: 141 mEq/L (ref 137–147)

## 2014-06-09 NOTE — Progress Notes (Signed)
    Subjective:  Had a brief episode of shortness of breath this am associated with lower abdominal discomfort, sx's now resolved. No chest pain or palps. Up in chair about 6 hours yesterday.  Objective:  Vital Signs in the last 24 hours: Temp:  [97.5 F (36.4 C)-98.2 F (36.8 C)] 97.5 F (36.4 C) (06/11 0447) Pulse Rate:  [60-67] 67 (06/11 0447) Resp:  [11-19] 18 (06/11 0447) BP: (118-135)/(64-80) 130/72 mmHg (06/11 0447) SpO2:  [94 %-98 %] 95 % (06/11 0447)  Intake/Output from previous day: 06/10 0701 - 06/11 0700 In: 3136.2 [P.O.:1200; I.V.:1786.2; IV Piggyback:150] Out: 9767 [Urine:1650; Drains:75]  Physical Exam: Pt is alert and oriented, NAD HEENT: normal Neck: JVP - normal Lungs: CTA bilaterally CV: RRR without murmur or gallop Abd: soft Ext: no C/C/E, distal pulses intact and equal Skin: warm/dry no rash   Lab Results:  Recent Labs  06/08/14 0346  WBC 9.6  HGB 13.8  PLT 131*    Recent Labs  06/08/14 0346 06/09/14 0325  NA 141 141  K 3.9 4.0  CL 104 103  CO2 26 26  GLUCOSE 92 98  BUN 15 12  CREATININE 0.93 0.84   No results found for this basename: TROPONINI, CK, MB,  in the last 72 hours  Tele: Sinus rhythm, personally reviewed. No AF.  Assessment/Plan:  PAF - maintaining sinus. Back on home dose Tikosyn. KDur started to keep potassium at or above 4.0 mg/dL. Otherwise as per 6/10 note. Doing fine from cardiac perspective.    Sherren Mocha, M.D. 06/09/2014, 7:11 AM

## 2014-06-09 NOTE — Progress Notes (Signed)
3 Days Post-Op  Subjective: No flatus or BM.  Minimal abdominal pain   Objective: Vital signs in last 24 hours: Temp:  [97.5 F (36.4 C)-98.2 F (36.8 C)] 97.5 F (36.4 C) (06/11 0447) Pulse Rate:  [60-67] 67 (06/11 0447) Resp:  [11-19] 18 (06/11 0447) BP: (118-135)/(64-80) 130/72 mmHg (06/11 0447) SpO2:  [94 %-98 %] 95 % (06/11 0447) Last BM Date: 06/03/14  Intake/Output from previous day: 06/10 0701 - 06/11 0700 In: 3136.2 [P.O.:1200; I.V.:1786.2; IV Piggyback:150] Out: 1725 [Urine:1650; Drains:75] Intake/Output this shift:    PE: General- In NAD Abdomen-soft, obese, active bowel sounds today, thin serous drain output, wounds are clean; RUQ incision with  wick in place Lab Results:   Recent Labs  06/08/14 0346  WBC 9.6  HGB 13.8  HCT 41.0  PLT 131*   BMET  Recent Labs  06/08/14 0346 06/09/14 0325  NA 141 141  K 3.9 4.0  CL 104 103  CO2 26 26  GLUCOSE 92 98  BUN 15 12  CREATININE 0.93 0.84  CALCIUM 8.7 8.8   PT/INR No results found for this basename: LABPROT, INR,  in the last 72 hours Comprehensive Metabolic Panel:    Component Value Date/Time   NA 141 06/09/2014 0325   NA 141 06/08/2014 0346   K 4.0 06/09/2014 0325   K 3.9 06/08/2014 0346   CL 103 06/09/2014 0325   CL 104 06/08/2014 0346   CO2 26 06/09/2014 0325   CO2 26 06/08/2014 0346   BUN 12 06/09/2014 0325   BUN 15 06/08/2014 0346   CREATININE 0.84 06/09/2014 0325   CREATININE 0.93 06/08/2014 0346   GLUCOSE 98 06/09/2014 0325   GLUCOSE 92 06/08/2014 0346   CALCIUM 8.8 06/09/2014 0325   CALCIUM 8.7 06/08/2014 0346   AST 24 06/04/2014 0935   AST 32 06/11/2013 0754   ALT 43 06/04/2014 0935   ALT 57* 06/11/2013 0754   ALKPHOS 62 06/04/2014 0935   ALKPHOS 59 06/11/2013 0754   BILITOT 1.5* 06/04/2014 0935   BILITOT 1.4* 06/11/2013 0754   PROT 7.1 06/04/2014 0935   PROT 6.7 06/11/2013 0754   ALBUMIN 4.3 06/04/2014 0935   ALBUMIN 4.1 06/11/2013 0754     Studies/Results: No results  found.  Anti-infectives: Anti-infectives   Start     Dose/Rate Route Frequency Ordered Stop   06/04/14 1800  piperacillin-tazobactam (ZOSYN) IVPB 3.375 g     3.375 g 12.5 mL/hr over 240 Minutes Intravenous Every 8 hours 06/04/14 1655     06/04/14 1200  ciprofloxacin (CIPRO) IVPB 400 mg     400 mg 200 mL/hr over 60 Minutes Intravenous  Once 06/04/14 1154 06/04/14 1300   06/04/14 1200  metroNIDAZOLE (FLAGYL) IVPB 500 mg     500 mg 100 mL/hr over 60 Minutes Intravenous  Once 06/04/14 1154 06/04/14 1409      Assessment Principal Problem:   Perforated appendicitis with necrosis s/p open appendectomy 06/07/14-improving Active Problems:   Postop ileus   OSA- continues on CPAP at night and as needed.   Atrial fibrillation-NSR   Chronic anticoagulation-on hold   Acute kidney injury-resolved   Type II DM- cbg 87-106    LOS: 5 days   Plan: Advance to full liquids.  Continue drain and IV abxs.  Wait for ileus to resolve.   [name] [name] J 06/09/2014

## 2014-06-10 LAB — GLUCOSE, CAPILLARY
Glucose-Capillary: 103 mg/dL — ABNORMAL HIGH (ref 70–99)
Glucose-Capillary: 107 mg/dL — ABNORMAL HIGH (ref 70–99)
Glucose-Capillary: 107 mg/dL — ABNORMAL HIGH (ref 70–99)
Glucose-Capillary: 153 mg/dL — ABNORMAL HIGH (ref 70–99)
Glucose-Capillary: 154 mg/dL — ABNORMAL HIGH (ref 70–99)
Glucose-Capillary: 97 mg/dL (ref 70–99)

## 2014-06-10 MED ORDER — MORPHINE SULFATE 2 MG/ML IJ SOLN
2.0000 mg | INTRAMUSCULAR | Status: DC | PRN
  Administered 2014-06-11 – 2014-06-12 (×3): 2 mg via INTRAVENOUS
  Filled 2014-06-10 (×3): qty 1

## 2014-06-10 MED ORDER — OXYCODONE-ACETAMINOPHEN 5-325 MG PO TABS: 1.0000 | ORAL_TABLET | ORAL | Status: DC | PRN

## 2014-06-10 MED ORDER — OXYCODONE HCL 5 MG PO TABS
5.0000 mg | ORAL_TABLET | ORAL | Status: DC | PRN
  Administered 2014-06-10 (×3): 5 mg via ORAL
  Administered 2014-06-11 (×3): 10 mg via ORAL
  Administered 2014-06-11: 5 mg via ORAL
  Administered 2014-06-11: 10 mg via ORAL
  Administered 2014-06-11 – 2014-06-13 (×6): 5 mg via ORAL
  Filled 2014-06-10: qty 2
  Filled 2014-06-10 (×2): qty 1
  Filled 2014-06-10: qty 2
  Filled 2014-06-10 (×4): qty 1
  Filled 2014-06-10: qty 2
  Filled 2014-06-10: qty 1
  Filled 2014-06-10: qty 2
  Filled 2014-06-10 (×2): qty 1
  Filled 2014-06-10: qty 2

## 2014-06-10 NOTE — Progress Notes (Signed)
CM CONSULT Talked to patient about DCP; patient lives at home and cares for his disabled wife with Fibromyalgia; Son and daughter in law lives close by and is currently caring for her now. Patient stated that he has been ambulating down the hallway, patient stated " I have strong legs"; CM informed that patient that he is doing too well to go to a SNF and there is a strong possibility that he will go home with HHC/ RN to check on him; Patient also stated that he did not want to go there and preferred to go home at discharge; Patient stated that they have friends/ church members that have offered to help them; CM encouraged patient to accept their offer because they care and want to help them. Lots of encouragement given, patient is very emotional about his wife. CM will continue to follow for dcp; possible home with [facility] at discharge; Aneta Mins [phone]

## 2014-06-10 NOTE — Discharge Instructions (Signed)
[facility]      [facility] [phone]  OPEN ABDOMINAL SURGERY: POST OP INSTRUCTIONS  Always review your discharge instruction sheet given to you by the facility where your surgery was performed.  IF YOU HAVE DISABILITY OR FAMILY LEAVE FORMS, YOU MUST BRING THEM TO THE OFFICE FOR PROCESSING.  PLEASE DO NOT GIVE THEM TO YOUR DOCTOR.  1. A prescription for pain medication may be given to you upon discharge.  Take your pain medication as prescribed, if needed.  If narcotic pain medicine is not needed, then you may take acetaminophen (Tylenol) or ibuprofen (Advil) as needed. 2. Take your usually prescribed medications unless otherwise directed. 3. If you need a refill on your pain medication, please contact your pharmacy. They will contact our office to request authorization.  Prescriptions will not be filled after 5pm or on week-ends. 4. You should follow a light diet the first few days after arrival home, such as soup and crackers, pudding, etc.unless your doctor has advised otherwise. A high-fiber, low fat diet can be resumed as tolerated.   Be sure to include lots of fluids daily. Most patients will experience some swelling and bruising on the chest and neck area.  Ice packs will help.  Swelling and bruising can take several days to resolve 5. Most patients will experience some swelling and bruising in the area of the incision. Ice pack will help. Swelling and bruising can take several days to resolve..  6. It is common to experience some constipation if taking pain medication after surgery.  Increasing fluid intake and taking a stool softener will usually help or prevent this problem from occurring.  A mild laxative (Milk of Magnesia or Miralax) should be taken according to package directions if there are no bowel movements after 48 hours. 7.  You may have steri-strips (small skin tapes) in place directly over the incision.  These strips should be left on the skin for 7-10 days.  If your  surgeon used skin glue on the incision, you may shower in 24 hours.  The glue will flake off over the next 2-3 weeks.  Any sutures or staples will be removed at the office during your follow-up visit. You may find that a light gauze bandage over your incision may keep your staples from being rubbed or pulled. You may shower and replace the bandage daily. 8. ACTIVITIES:  You may resume regular (light) daily activities beginning the next day--such as daily self-care, walking, climbing stairs--gradually increasing activities as tolerated.  You may have sexual intercourse when it is comfortable.  Refrain from any heavy lifting or straining until approved by your doctor. a. You may drive when you no longer are taking prescription pain medication, you can comfortably wear a seatbelt, and you can safely maneuver your car and apply brakes b. Return to Work: ___________________________________ 9. You should see your doctor in the office for a follow-up appointment approximately two weeks after your surgery.  Make sure that you call for this appointment within a day or two after you arrive home to insure a convenient appointment time. OTHER INSTRUCTIONS:  _____________________________________________________________ _____________________________________________________________  WHEN TO CALL YOUR DOCTOR: 1. Fever over 101.0 2. Inability to urinate 3. Nausea and/or vomiting 4. Extreme swelling or bruising 5. Continued bleeding from incision. 6. Increased pain, redness, or drainage from the incision. 7. Difficulty swallowing or breathing 8. Muscle cramping or spasms. 9. Numbness or tingling in hands or feet or around lips.  The clinic staff is available to   answer your questions during regular business hours.  Please don't hesitate to call and ask to speak to one of the nurses if you have concerns.  For further questions, please visit [email or web address]   

## 2014-06-10 NOTE — Progress Notes (Signed)
4 Days Post-Op  Subjective: Passing some gas.  No BM.   Objective: Vital signs in last 24 hours: Temp:  [98.2 F (36.8 C)-98.4 F (36.9 C)] 98.4 F (36.9 C) (06/12 0439) Pulse Rate:  [49-80] 80 (06/12 0439) Resp:  [16-19] 18 (06/12 0800) BP: (125-137)/(56-76) 130/76 mmHg (06/12 0439) SpO2:  [95 %-98 %] 97 % (06/12 0800) Last BM Date: 06/03/14  Intake/Output from previous day: 06/11 0701 - 06/12 0700 In: 1370 [P.O.:1320; I.V.:50] Out: 1677.5 [Urine:1650; Drains:27.5] Intake/Output this shift:    PE: General- In NAD Abdomen-soft, obese, some serous drainage from RUQ incision, one wick still in Lab Results:   Recent Labs  06/08/14 0346  WBC 9.6  HGB 13.8  HCT 41.0  PLT 131*   BMET  Recent Labs  06/08/14 0346 06/09/14 0325  NA 141 141  K 3.9 4.0  CL 104 103  CO2 26 26  GLUCOSE 92 98  BUN 15 12  CREATININE 0.93 0.84  CALCIUM 8.7 8.8   PT/INR No results found for this basename: LABPROT, INR,  in the last 72 hours Comprehensive Metabolic Panel:    Component Value Date/Time   NA 141 06/09/2014 0325   NA 141 06/08/2014 0346   K 4.0 06/09/2014 0325   K 3.9 06/08/2014 0346   CL 103 06/09/2014 0325   CL 104 06/08/2014 0346   CO2 26 06/09/2014 0325   CO2 26 06/08/2014 0346   BUN 12 06/09/2014 0325   BUN 15 06/08/2014 0346   CREATININE 0.84 06/09/2014 0325   CREATININE 0.93 06/08/2014 0346   GLUCOSE 98 06/09/2014 0325   GLUCOSE 92 06/08/2014 0346   CALCIUM 8.8 06/09/2014 0325   CALCIUM 8.7 06/08/2014 0346   AST 24 06/04/2014 0935   AST 32 06/11/2013 0754   ALT 43 06/04/2014 0935   ALT 57* 06/11/2013 0754   ALKPHOS 62 06/04/2014 0935   ALKPHOS 59 06/11/2013 0754   BILITOT 1.5* 06/04/2014 0935   BILITOT 1.4* 06/11/2013 0754   PROT 7.1 06/04/2014 0935   PROT 6.7 06/11/2013 0754   ALBUMIN 4.3 06/04/2014 0935   ALBUMIN 4.1 06/11/2013 0754     Studies/Results: No results found.  Anti-infectives: Anti-infectives   Start     Dose/Rate Route Frequency Ordered Stop   06/04/14 1800   piperacillin-tazobactam (ZOSYN) IVPB 3.375 g     3.375 g 12.5 mL/hr over 240 Minutes Intravenous Every 8 hours 06/04/14 1655     06/04/14 1200  ciprofloxacin (CIPRO) IVPB 400 mg     400 mg 200 mL/hr over 60 Minutes Intravenous  Once 06/04/14 1154 06/04/14 1300   06/04/14 1200  metroNIDAZOLE (FLAGYL) IVPB 500 mg     500 mg 100 mL/hr over 60 Minutes Intravenous  Once 06/04/14 1154 06/04/14 1409      Assessment Principal Problem:   Perforated appendicitis with necrosis s/p open appendectomy 06/07/14 Active Problems:   Postop ileus-starting to improve   OSA- continues on CPAP at night and as needed.   Atrial fibrillation-NSR   Chronic anticoagulation-on hold   Type II DM- cbg 91-124    LOS: 6 days   Plan: Advance to bland diet.  D/C PCA and start oral analgesic.  His wife is completely disabled and cannot help him at home.  Will ask case management to see him.  Do not think he will have any issues that require home health.  Can restart Pradaxa once drain is out.  Continue IV abxs.   Kiowa Peifer J 06/10/2014

## 2014-06-10 NOTE — Progress Notes (Signed)
ANTIBIOTIC CONSULT NOTE - FOLLOW UP  Pharmacy Consult for Zosyn Indication: perforated appendicitis with necrosis   Allergies  Allergen Reactions  . Penicillins     Childhood reaction   . Procaine Hcl Hives and Other (See Comments)    Red in the face and neck   . Pseudoephedrine Other (See Comments)    Patient went into afib  . Sulfonamide Derivatives     Childhood reaction     Patient Measurements: Height: 6\' 1"  (185.4 cm) Weight: 325 lb 8 oz (147.646 kg) IBW/kg (Calculated) : 79.9  Vital Signs: Temp: 98.4 F (36.9 C) (06/12 0439) Temp src: Oral (06/12 0439) BP: 130/76 mmHg (06/12 0439) Pulse Rate: 80 (06/12 0439) Intake/Output from previous day: 06/11 0701 - 06/12 0700 In: 1370 [P.O.:1320; I.V.:50] Out: 1677.5 [Urine:1650; Drains:27.5]  Labs:  Recent Labs  06/08/14 0346 06/09/14 0325  WBC 9.6  --   HGB 13.8  --   PLT 131*  --   CREATININE 0.93 0.84   Estimated Creatinine Clearance: 134.5 ml/min (by C-G formula based on Cr of 0.84).  Microbiology: Recent Results (from the past 720 hour(s))  SURGICAL PCR SCREEN     Status: None   Collection Time    06/06/14  7:05 AM      Result Value Ref Range Status   MRSA, PCR NEGATIVE  NEGATIVE Final   Staphylococcus aureus NEGATIVE  NEGATIVE Final   Comment:            The Xpert SA Assay (FDA     approved for NASAL specimens     in patients over 32 years of age),     is one component of     a comprehensive surveillance     program.  Test performance has     been validated by [employer] for patients greater     than or equal to 15 year old.     It is not intended     to diagnose infection nor to     guide or monitor treatment.  MRSA PCR SCREENING     Status: None   Collection Time    06/06/14 12:17 PM      Result Value Ref Range Status   MRSA by PCR NEGATIVE  NEGATIVE Final   Comment:            The GeneXpert MRSA Assay (FDA     approved for NASAL specimens     only), is one component of a   comprehensive MRSA colonization     surveillance program. It is not     intended to diagnose MRSA     infection nor to guide or     monitor treatment for     MRSA infections.    Anti-infectives   Start     Dose/Rate Route Frequency Ordered Stop   06/04/14 1800  piperacillin-tazobactam (ZOSYN) IVPB 3.375 g     3.375 g 12.5 mL/hr over 240 Minutes Intravenous Every 8 hours 06/04/14 1655     06/04/14 1200  ciprofloxacin (CIPRO) IVPB 400 mg     400 mg 200 mL/hr over 60 Minutes Intravenous  Once 06/04/14 1154 06/04/14 1300   06/04/14 1200  metroNIDAZOLE (FLAGYL) IVPB 500 mg     500 mg 100 mL/hr over 60 Minutes Intravenous  Once 06/04/14 1154 06/04/14 1409      Assessment: 63 yoM with PMHx CAD, Afib on pradaxa and tikosyn, HLD, obesity, OSA, and thrombocytopenia presents to [facility]  with appendicitis.  Surgery planned tentatively for 6/8 d/t washout period for pradaxa (~48 hours).  Pt with allergy to penicillins (rxn unknown).  Discussed with MD who would like to do trial of zosyn and try to avoid cipro due to DDI with tikosyn.  S/p appendectomy on 6/8 for acute perforated appendicitis with necrosis.   Day #6 Zosyn  Tmax: Afebrile  WBCs: decreased to wnl  Renal: SCr stable, AKI resolved, CrCl >100 ml/min  Goal of Therapy:  Appropriate abx dosing, eradication of infection.   Plan:   Continue Zosyn 3.375g IV Q8H infused over 4hrs.  Follow up renal function, adjust dose as needed.  Ralene Bathe, PharmD, BCPS 06/10/2014, 11:15 AM  Pager: [phone]

## 2014-06-10 NOTE — Progress Notes (Signed)
    Subjective:  Walked twice yesterday. No chest pain or dyspnea.   Objective:  Vital Signs in the last 24 hours: Temp:  [98.2 F (36.8 C)-98.4 F (36.9 C)] 98.4 F (36.9 C) (06/12 0439) Pulse Rate:  [49-80] 80 (06/12 0439) Resp:  [16-19] 18 (06/12 0439) BP: (125-137)/(56-76) 130/76 mmHg (06/12 0439) SpO2:  [95 %-98 %] 98 % (06/12 0439)  Intake/Output from previous day: 06/11 0701 - 06/12 0700 In: 1370 [P.O.:1320; I.V.:50] Out: 1677.5 [Urine:1650; Drains:27.5]  Physical Exam: Pt is alert and oriented, NAD HEENT: normal Neck: JVP - normal, carotids 2+= without bruits Lungs: CTA bilaterally CV: RRR without murmur or gallop Abd: soft Ext: no C/C/E, distal pulses intact and equal Skin: warm/dry no rash   Lab Results:  Recent Labs  06/08/14 0346  WBC 9.6  HGB 13.8  PLT 131*    Recent Labs  06/08/14 0346 06/09/14 0325  NA 141 141  K 3.9 4.0  CL 104 103  CO2 26 26  GLUCOSE 92 98  BUN 15 12  CREATININE 0.93 0.84   No results found for this basename: TROPONINI, CK, MB,  in the last 72 hours  Tele: Sinus rhythm - personally reviewed. No afib.  Assessment/Plan:  PAF - maintaining sinus. Repeat EKG this am to eval QT on higher Tikosyn dose, but should be fine as this was his outpatient dose. Resume Pradaxa when ok from post-op surgical standpoint. Will sign off - please call if any problems. I think ok to discontinue telemetry.   Sherren Mocha, M.D. 06/10/2014, 7:38 AM

## 2014-06-11 LAB — GLUCOSE, CAPILLARY
Glucose-Capillary: 103 mg/dL — ABNORMAL HIGH (ref 70–99)
Glucose-Capillary: 120 mg/dL — ABNORMAL HIGH (ref 70–99)
Glucose-Capillary: 133 mg/dL — ABNORMAL HIGH (ref 70–99)
Glucose-Capillary: 98 mg/dL (ref 70–99)
Glucose-Capillary: 98 mg/dL (ref 70–99)
Glucose-Capillary: 98 mg/dL (ref 70–99)

## 2014-06-11 NOTE — Progress Notes (Signed)
Resumed care of pt, no change from am assessment.  Pt with no c/o, just tired today.

## 2014-06-11 NOTE — Progress Notes (Signed)
Checked with pt about Cpap,he states that he will put it on hisself when ready.I made sure there was water in the reservoir and the oxygen was hooked up.Told pt to call if he needed any help.

## 2014-06-11 NOTE — Progress Notes (Signed)
5 Days Post-Op  Subjective: He is still having some nausea and abdominal soreness  Objective: Vital signs in last 24 hours: Temp:  [98 F (36.7 C)-98.3 F (36.8 C)] 98 F (36.7 C) (06/13 0427) Pulse Rate:  [50-57] 57 (06/13 0427) Resp:  [16-18] 18 (06/13 0427) BP: (140-153)/(73-76) 140/75 mmHg (06/13 0427) SpO2:  [91 %-98 %] 93 % (06/13 0427) Weight:  [349 lb 4.8 oz (158.441 kg)-349 lb 9.6 oz (158.578 kg)] 349 lb 4.8 oz (158.441 kg) (06/13 0515) Last BM Date: 06/03/14  Intake/Output from previous day: 06/12 0701 - 06/13 0700 In: 2638.8 [P.O.:600; I.V.:1738.8; IV Piggyback:300] Out: 7062 [Urine:1725; Drains:14] Intake/Output this shift:    Resp: clear to auscultation bilaterally Cardio: regular rate and rhythm GI: soft, mild tenderness. wound ok with wicks in place. drain output serosang  Lab Results:  No results found for this basename: WBC, HGB, HCT, PLT,  in the last 72 hours BMET  Recent Labs  06/09/14 0325  NA 141  K 4.0  CL 103  CO2 26  GLUCOSE 98  BUN 12  CREATININE 0.84  CALCIUM 8.8   PT/INR No results found for this basename: LABPROT, INR,  in the last 72 hours ABG No results found for this basename: PHART, PCO2, PO2, HCO3,  in the last 72 hours  Studies/Results: No results found.  Anti-infectives: Anti-infectives   Start     Dose/Rate Route Frequency Ordered Stop   06/04/14 1800  piperacillin-tazobactam (ZOSYN) IVPB 3.375 g     3.375 g 12.5 mL/hr over 240 Minutes Intravenous Every 8 hours 06/04/14 1655     06/04/14 1200  ciprofloxacin (CIPRO) IVPB 400 mg     400 mg 200 mL/hr over 60 Minutes Intravenous  Once 06/04/14 1154 06/04/14 1300   06/04/14 1200  metroNIDAZOLE (FLAGYL) IVPB 500 mg     500 mg 100 mL/hr over 60 Minutes Intravenous  Once 06/04/14 1154 06/04/14 1409      Assessment/Plan: s/p Procedure(s): APPENDECTOMY LAPAROSCOPIC attemted (N/A) APPENDECTOMY (N/A) stay with soft diet until ileus fully resolves Ambulate Continue abx  LOS: 7 days    TOTH III,PAUL S 06/11/2014

## 2014-06-11 NOTE — Progress Notes (Addendum)
Patient self administered CPAP. Water chamber is filled with sterile water with previous HHN treatment. Patient is tolerating well at this time. RT to monitor as needed.Patient was told to call when he was ready, but no one called.

## 2014-06-12 LAB — BASIC METABOLIC PANEL
BUN: 10 mg/dL (ref 6–23)
CO2: 28 mEq/L (ref 19–32)
Calcium: 9.1 mg/dL (ref 8.4–10.5)
Chloride: 101 mEq/L (ref 96–112)
Creatinine, Ser: 0.9 mg/dL (ref 0.50–1.35)
GFR calc Af Amer: >90 mL/min (ref >90)
GFR calc non Af Amer: 88 mL/min — ABNORMAL LOW (ref >90)
Glucose, Bld: 112 mg/dL — ABNORMAL HIGH (ref 70–99)
Potassium: 4.1 mEq/L (ref 3.7–5.3)
Sodium: 140 mEq/L (ref 137–147)

## 2014-06-12 LAB — GLUCOSE, CAPILLARY
Glucose-Capillary: 101 mg/dL — ABNORMAL HIGH (ref 70–99)
Glucose-Capillary: 116 mg/dL — ABNORMAL HIGH (ref 70–99)
Glucose-Capillary: 118 mg/dL — ABNORMAL HIGH (ref 70–99)
Glucose-Capillary: 121 mg/dL — ABNORMAL HIGH (ref 70–99)
Glucose-Capillary: 144 mg/dL — ABNORMAL HIGH (ref 70–99)
Glucose-Capillary: 97 mg/dL (ref 70–99)

## 2014-06-12 LAB — CBC
HCT: 40.9 % (ref 39.0–52.0)
Hemoglobin: 14 g/dL (ref 13.0–17.0)
MCH: 30.9 pg (ref 26.0–34.0)
MCHC: 34.2 g/dL (ref 30.0–36.0)
MCV: 90.3 fL (ref 78.0–100.0)
Platelets: 163 10*3/uL (ref 150–400)
RBC: 4.53 MIL/uL (ref 4.22–5.81)
RDW: 13.1 % (ref 11.5–15.5)
WBC: 8.3 10*3/uL (ref 4.0–10.5)

## 2014-06-12 LAB — MAGNESIUM: Magnesium: 1.8 mg/dL (ref 1.5–2.5)

## 2014-06-12 NOTE — Progress Notes (Signed)
Pt not ready for Cpap at this time,states he can put it on hisself when ready.Instructed pt to call if he needs help.

## 2014-06-12 NOTE — Progress Notes (Signed)
6 Days Post-Op  Subjective: Oral output gradually improving. He thinks he may need home health for dressing changes at home since his wife is completely disabled  Objective: Vital signs in last 24 hours: Temp:  [97.4 F (36.3 C)-98 F (36.7 C)] 97.4 F (36.3 C) (06/14 0437) Pulse Rate:  [56-61] 56 (06/14 0437) Resp:  [16-18] 16 (06/14 0437) BP: (130-140)/(71-86) 135/86 mmHg (06/14 0437) SpO2:  [90 %-93 %] 91 % (06/14 0437) Weight:  [348 lb 14.4 oz (158.26 kg)] 348 lb 14.4 oz (158.26 kg) (06/14 0705) Last BM Date: 06/03/14  Intake/Output from previous day: 06/13 0701 - 06/14 0700 In: 1934.2 [P.O.:580; I.V.:1204.2; IV Piggyback:150] Out: 1405 [Urine:1400; Drains:5] Intake/Output this shift:    Resp: clear to auscultation bilaterally Cardio: regular rate and rhythm GI: soft, mild tenderness near incision. drain output serosang  Lab Results:   Recent Labs  06/12/14 0455  WBC 8.3  HGB 14.0  HCT 40.9  PLT 163   BMET  Recent Labs  06/12/14 0455  NA 140  K 4.1  CL 101  CO2 28  GLUCOSE 112*  BUN 10  CREATININE 0.90  CALCIUM 9.1   PT/INR No results found for this basename: LABPROT, INR,  in the last 72 hours ABG No results found for this basename: PHART, PCO2, PO2, HCO3,  in the last 72 hours  Studies/Results: No results found.  Anti-infectives: Anti-infectives   Start     Dose/Rate Route Frequency Ordered Stop   06/04/14 1800  piperacillin-tazobactam (ZOSYN) IVPB 3.375 g     3.375 g 12.5 mL/hr over 240 Minutes Intravenous Every 8 hours 06/04/14 1655     06/04/14 1200  ciprofloxacin (CIPRO) IVPB 400 mg     400 mg 200 mL/hr over 60 Minutes Intravenous  Once 06/04/14 1154 06/04/14 1300   06/04/14 1200  metroNIDAZOLE (FLAGYL) IVPB 500 mg     500 mg 100 mL/hr over 60 Minutes Intravenous  Once 06/04/14 1154 06/04/14 1409      Assessment/Plan: s/p Procedure(s): APPENDECTOMY LAPAROSCOPIC attemted (N/A) APPENDECTOMY (N/A) Advance diet Continue dressing  changes. Consult home health Hopefully home tomorrow  LOS: 8 days    TOTH III,PAUL S 06/12/2014

## 2014-06-12 NOTE — Progress Notes (Signed)
CARE MANAGEMENT NOTE 06/12/2014  Patient:  [name] [name], [name] [name]   Account Number:  [id number]  Date Initiated:  06/10/2014  Documentation initiated by:  Olga Coaster  Subjective/Objective Assessment:   ADMITTED FOR PEFORATED APPENDIX     Action/Plan:   SEE NOTE BELOW   Anticipated DC Date:  06/15/2014   Anticipated DC Plan:  [place]  CM consult      [facility] Choice  HOME HEALTH   Choice offered to / List presented to:  C-1 Patient        [place] arranged  HH-1 RN      [place].   Status of service:  Completed, signed off Medicare Important Message given?   (If response is "NO", the following Medicare IM given date fields will be blank) Date Medicare IM given:   Date Additional Medicare IM given:    Discharge Disposition:  [place]  Per UR Regulation:  Reviewed for med. necessity/level of care/duration of stay  If discussed at [place] of Stay Meetings, dates discussed:    Comments:  06/12/2014 1600 NCM spoke to pt and offered choice for [facility]. Pt states he gets his DME with [facility]. Requested [facility] for [facility]. Notified [facility] for [place] upon dc. Jonnie Finner RN CCM Case Mgmt phone [phone]  06/10/2014- CM CONSULT Talked to patient about DCP; patient lives at home and cares for his disabled wife with Fibromyalgia; Son and daughter in law lives close by and is currently caring for her now. Patient stated that he has been ambulating down the hallway, patient stated " I have strong legs"; CM informed that patient that he is doing too well to go to a SNF and there is a strong possibility that he will go home with HHC/ RN to check on him; Patient also stated that he did not want to go there and preferred to go home at discharge; Patient stated that they have friends/ church members that have offered to help them; CM encouraged patient to accept their offer because they care and want to help them. Lots of  encouragement given, patient is very emotional about his wife. CM will continue to follow for dcp; possible home with [facility] at discharge; Aneta Mins [phone]

## 2014-06-13 LAB — GLUCOSE, CAPILLARY
Glucose-Capillary: 101 mg/dL — ABNORMAL HIGH (ref 70–99)
Glucose-Capillary: 104 mg/dL — ABNORMAL HIGH (ref 70–99)
Glucose-Capillary: 112 mg/dL — ABNORMAL HIGH (ref 70–99)

## 2014-06-13 MED ORDER — ONDANSETRON HCL 4 MG PO TABS: 4.0000 mg | ORAL_TABLET | Freq: Three times a day (TID) | ORAL | Status: DC | PRN

## 2014-06-13 MED ORDER — OXYCODONE HCL 5 MG PO TABS: 5.0000 mg | ORAL_TABLET | ORAL | Status: DC | PRN

## 2014-06-13 NOTE — Discharge Summary (Signed)
Patient ID: [name] [name] MRN: [id number] DOB/AGE: November 12, 1951 63 y.o.  Admit date: 06/04/2014 Discharge date: 06/13/2014  Procedures: Laparoscopy converted to minilaparotomy with appendectomy on 06-06-14 by Dr. Zella Richer  Consults: cardiology  Reason for Admission: [name] [name] is a 63 y.o. (DOB: March 03, 1951) white male whose primary care physician is [name] Dawson, MD and comes to [facility] today for abdominal pain.  He started having pain Thursday evening, 6/4. The pain got worse yesterday, but he did not come to the ER until today. He had a prior umbilical hernia by Dr. Madison Hickman in 2009. He has had no other abdominal surgery. He had a normal colonoscopy in 2012 by Dr. Collene Mares.  CT Abdomen - 06/04/2104 - 1. Uncomplicated appendicitis. These results were called by telephone at the time of interpretation on 06/04/2014 at 11:51 AM to  Dr. Shea Evans, Aline Brochure , who verbally acknowledged these results.  2. Cholelithiasis.  3. Right common iliac artery aneurysm, 2.1 cm.  4. Probable pleural based scarring in the right lung base. Given the concurrent centrilobular emphysema, follow-up chest CT at 6 - 12 months is recommended.  WBC - 10,100 - 06/04/2014  Admission Diagnoses:  1. Acute appendicitis 2. Anticoagulated on pradaxa 3. Thrombocytopenia 4. CAD 5. A. Fib 6. OSA 7. DM  Hospital Course: The patient was admitted and his pradaxa was held for 2 days.  He was taken to the operating room on 06-06-14, where he underwent the above procedure.  He tolerated this well, but sent to SDU for observation post op due to OSA.  His Zosyn was continued at this time, due to findings of perforated appendicitis.  He was transferred to the tele floor on POD 1.  Cardiology evaluated the patient and followed him with [place] during this stay.  The patient developed a post operative ileus and was kept NPO initially.  He started passing some flatus on POD 4.  His diet was advanced as tolerated.  He was noted on POD 6, to have a  small amount of drainage from his wound after his wicks were removed.  This was packed.  HH was arranged for wound care.  His JP drain was kept in place until POD 7, day of discharge.  This was removed with no difficulties.  His Pradaxa is to be resumed at this point.  The patient is otherwise stable at this time for dc home.  Discharge Diagnoses:  Principal Problem:   Perforated appendicitis with necrosis s/p open appendectomy 06/07/14 Active Problems:   HYPERLIPIDEMIA   Coronary atherosclerosis of unspecified type of vessel, native or graft   Atrial fibrillation   Chronic anticoagulation s/p lap converted to minilaparotomy for appendectomy  Discharge Medications:   Medication List         atorvastatin 80 MG tablet  Commonly known as:  LIPITOR  Take 80 mg by mouth at bedtime.     clotrimazole 1 % cream  Commonly known as:  LOTRIMIN  Apply 1 application topically daily as needed.     dabigatran 150 MG Caps capsule  Commonly known as:  PRADAXA  Take 150 mg by mouth 2 (two) times daily.     dofetilide 500 MCG capsule  Commonly known as:  TIKOSYN  Take 500 mcg by mouth 2 (two) times daily.     famotidine 20 MG tablet  Commonly known as:  PEPCID  Take 10-20 mg by mouth daily as needed for heartburn or indigestion.     fish oil-omega-3 fatty acids 1000 MG capsule  Take 1 g by mouth 2 (two) times daily.     MAGNESIUM OXIDE PO  Take 750 mg by mouth every evening.     metFORMIN 500 MG 24 hr tablet  Commonly known as:  GLUCOPHAGE-XR  Take 500 mg by mouth 2 (two) times daily.     MULTIPLE VITAMIN PO  Take 1 tablet by mouth every evening.     nitroGLYCERIN 0.4 MG SL tablet  Commonly known as:  NITROSTAT  Place 1 tablet (0.4 mg total) under the tongue every 5 (five) minutes as needed.     ondansetron 4 MG tablet  Commonly known as:  ZOFRAN  Take 1 tablet (4 mg total) by mouth every 8 (eight) hours as needed for nausea or vomiting.     oxyCODONE 5 MG immediate release  tablet  Commonly known as:  Oxy IR/ROXICODONE  Take 1-2 tablets (5-10 mg total) by mouth every 4 (four) hours as needed for moderate pain.     pantoprazole 40 MG tablet  Commonly known as:  PROTONIX  Take 40 mg by mouth daily.     potassium chloride SA 20 MEQ tablet  Commonly known as:  K-DUR,KLOR-CON  Take 20 mEq by mouth daily.     zolpidem 10 MG tablet  Commonly known as:  AMBIEN  Take 10 mg by mouth at bedtime as needed for sleep.        Discharge Instructions:     Follow-up Information   Follow up with [place] [facility] On 06/16/2014. (For suture removal, 3:00pm, this is a nurse only appointment for staple removal)    Contact information:   [place] [address] [phone]      Follow up with Odis Hollingshead, MD. Schedule an appointment as soon as possible for a visit in 3 weeks.   Specialty:  General Surgery   Contact information:   [address] [place] [address] [phone]       Follow up with [facility] RN)    Contact information:   [address] [place] [address] [phone]       Signed: Henreitta Cea 06/13/2014, 10:14 AM

## 2014-06-15 ENCOUNTER — Telehealth (INDEPENDENT_AMBULATORY_CARE_PROVIDER_SITE_OTHER): Payer: Self-pay

## 2014-06-15 NOTE — Telephone Encounter (Signed)
Pt is s/p perforated appendix. [facility] nurse calling to report an odor to wound drainage.  There is no redness at the site and no tenderness.  No fever or chills. Pt is scheduled for staple removal on 06/16/14.  Will check his wound at that time for any signs or symptoms of infection.

## 2014-06-16 ENCOUNTER — Ambulatory Visit (INDEPENDENT_AMBULATORY_CARE_PROVIDER_SITE_OTHER): Payer: 59

## 2014-06-16 ENCOUNTER — Encounter (INDEPENDENT_AMBULATORY_CARE_PROVIDER_SITE_OTHER): Payer: Self-pay

## 2014-06-16 VITALS — BP 128/88 | HR 80 | Temp 98.6°F | Resp 14 | Ht 72.0 in | Wt 169.0 lb

## 2014-06-16 DIAGNOSIS — Z4802 Encounter for removal of sutures: Secondary | ICD-10-CM

## 2014-06-16 NOTE — Patient Instructions (Signed)
Patient came in today to have his staple removed from surgery. The surgery site looked good. I placed steri streps over the surgery site. The patient stated that they went out last for dinner and he could not stay out long due he started to hurt, and the wife was with him and told him that he took some pain med, she stated that he did not like taking any pain med. I told him that if he is hurting he can take some pain meds. I asked if he was drinking a lot of fluid and the wife stated no, I told that he need to drink a lot of fluid due to the heat that we are having and if he is taking any pain meds, and I told him to eat more protein that will help him not to feel weak. I called Sharyn Lull and she let me put the patient on next week to come in to see Dr Hulen Skains on 06-21-2014

## 2014-06-17 NOTE — Addendum Note (Signed)
Addended by: Jeralyn Ruths on: 06/17/2014 09:23 AM   Modules accepted: Level of Service

## 2014-06-17 NOTE — Progress Notes (Signed)
Clinical note from 06/16/14 in this patient's chart was entered in error.  It is the wrong progress note for this patient.  This note is an addendum with the correct visit note.  This patient is s/p perforated appendix and open appendectomy by Dr. Zella Richer.  He is here today for staple removal and to assess his open wound.  Home health nurse called yesterday concerned about foul odor of the wound drainage.  Pt's dressing was removed.  Some erythema was noted around the incision, most likely due to irritation from the tape.  The packing was removed and purulent drainage was present.  Dr. Georgette Dover was asked to see the patient.  He removed the staples. Lidocaine was injected into the wound and it was completely opened.  The odor was very strong.  Wound was packed with dry gauze, and then covered with more gauze and an ABD pad.  Pt tolerated this procedure well and expressed appreciation. Pt was told that a revised order for dressing changes wound be sent to [employer].  Appt to f/u with Dr. Zella Richer was made for the following week.

## 2014-06-20 ENCOUNTER — Encounter (INDEPENDENT_AMBULATORY_CARE_PROVIDER_SITE_OTHER): Payer: Self-pay | Admitting: General Surgery

## 2014-06-20 ENCOUNTER — Ambulatory Visit (INDEPENDENT_AMBULATORY_CARE_PROVIDER_SITE_OTHER): Payer: 59 | Admitting: General Surgery

## 2014-06-20 ENCOUNTER — Other Ambulatory Visit (INDEPENDENT_AMBULATORY_CARE_PROVIDER_SITE_OTHER): Payer: Self-pay

## 2014-06-20 ENCOUNTER — Other Ambulatory Visit (INDEPENDENT_AMBULATORY_CARE_PROVIDER_SITE_OTHER): Payer: Self-pay | Admitting: General Surgery

## 2014-06-20 VITALS — BP 134/74 | HR 66 | Temp 98.0°F | Resp 18 | Ht 73.0 in | Wt 320.0 lb

## 2014-06-20 DIAGNOSIS — T814XXD Infection following a procedure, subsequent encounter: Principal | ICD-10-CM

## 2014-06-20 DIAGNOSIS — IMO0001 Reserved for inherently not codable concepts without codable children: Secondary | ICD-10-CM

## 2014-06-20 DIAGNOSIS — T8149XA Infection following a procedure, other surgical site, initial encounter: Secondary | ICD-10-CM | POA: Insufficient documentation

## 2014-06-20 DIAGNOSIS — T8140XA Infection following a procedure, unspecified, initial encounter: Secondary | ICD-10-CM

## 2014-06-20 DIAGNOSIS — Z5189 Encounter for other specified aftercare: Secondary | ICD-10-CM

## 2014-06-20 LAB — CBC WITH DIFFERENTIAL/PLATELET
Basophils Absolute: 0 10*3/uL (ref 0.0–0.1)
Basophils Relative: 0 % (ref 0–1)
Eosinophils Absolute: 0 10*3/uL (ref 0.0–0.7)
Eosinophils Relative: 0 % (ref 0–5)
HCT: 44.5 % (ref 39.0–52.0)
Hemoglobin: 14.7 g/dL (ref 13.0–17.0)
Lymphocytes Relative: 25 % (ref 12–46)
Lymphs Abs: 1.9 10*3/uL (ref 0.7–4.0)
MCH: 30.9 pg (ref 26.0–34.0)
MCHC: 33 g/dL (ref 30.0–36.0)
MCV: 93.6 fL (ref 78.0–100.0)
Monocytes Absolute: 0.7 10*3/uL (ref 0.1–1.0)
Monocytes Relative: 10 % (ref 3–12)
Neutro Abs: 4.8 10*3/uL (ref 1.7–7.7)
Neutrophils Relative %: 65 % (ref 43–77)
Platelets: 161 10*3/uL (ref 150–400)
RBC: 4.76 MIL/uL (ref 4.22–5.81)
RDW: 12.9 % (ref 11.5–15.5)
WBC: 7.4 10*3/uL (ref 4.0–10.5)

## 2014-06-20 MED ORDER — OXYCODONE HCL 5 MG PO TABS: 5.0000 mg | ORAL_TABLET | ORAL | Status: DC | PRN

## 2014-06-20 NOTE — Progress Notes (Signed)
Procedure:  Lap converted to open appendectomy for perforated appendicitis with necrosis  Date:  06/06/14  Pathology:  Appendicitis  History:  He is here for a postoperative visit. He has developed a wound infection. The wound has been opened and he is undergoing dressing changes daily. He stills is having a fair amount of drainage.  He is eating and his bowels are moving. No fever. However, he does not feel as well as he did when he was in the hospital.  Exam: General- Is in NAD. Abd-Soft, right lower quadrant incision is open and has necrotic subcutaneous tissue which is foul-smelling. This was bluntly and sharply debrided. PDS sutures are noted and are intact. No erythema. There is a rash around the incision.  Assessment:  Postoperative wound infection with necrotic subcutaneous tissue.  Plan:  Start dressing changes twice a day and so once a day. Check CBC. His white blood cell count is significantly elevated, we'll check CT scan. Return visit one week. Refill oxycodone.

## 2014-06-20 NOTE — Progress Notes (Signed)
Patient ID: [name] [name], male   DOB: 1951/07/09, 63 y.o.   MRN: [id number] His CBC is normal. This was discussed with him.

## 2014-06-20 NOTE — Patient Instructions (Addendum)
Need to do dressing changes twice a day. Continue light activities-no lifting over 10 pounds, no strenuous activity.  Go to the lab and have your blood drawn today.

## 2014-06-23 ENCOUNTER — Ambulatory Visit (INDEPENDENT_AMBULATORY_CARE_PROVIDER_SITE_OTHER): Payer: 59

## 2014-06-23 ENCOUNTER — Other Ambulatory Visit: Payer: Self-pay | Admitting: Internal Medicine

## 2014-06-23 ENCOUNTER — Telehealth (INDEPENDENT_AMBULATORY_CARE_PROVIDER_SITE_OTHER): Payer: Self-pay

## 2014-06-23 DIAGNOSIS — Z4801 Encounter for change or removal of surgical wound dressing: Secondary | ICD-10-CM

## 2014-06-23 NOTE — Telephone Encounter (Signed)
Pt now understands that [name] [name] will only perform dressing changes 3 x week due to pt's health insurance coverage [name]).  The patient says he thinks he can change the dressing himself on the other days.  We will go over this again at his nurse visit here tomorrow.

## 2014-06-23 NOTE — Progress Notes (Signed)
Pt is here today for a wound dressing change.  We are still trying to make [facility] arrangements for him; and he will also need private nursing visits for dressing changes since there is no one in the home to educate.  Wound is still draining, but there is almost no odor now.  The patient's skin around the wound is very red and irritated.  In some areas it is seeping a small amount of blood.  Pt says this itches, but no pain.  Wound was packed with saline gauze and covered with 4x4's and an ABD pad.  Pt will come again tomorrow at the same time unless home health is arranged.  Will message Dr. Zella Richer about the pt's irritated skin.

## 2014-06-24 ENCOUNTER — Other Ambulatory Visit: Payer: Self-pay | Admitting: Internal Medicine

## 2014-06-24 ENCOUNTER — Ambulatory Visit (INDEPENDENT_AMBULATORY_CARE_PROVIDER_SITE_OTHER): Payer: 59

## 2014-06-24 DIAGNOSIS — Z4801 Encounter for change or removal of surgical wound dressing: Secondary | ICD-10-CM

## 2014-06-24 NOTE — Progress Notes (Signed)
Pt is here today for his daily dressing change.  His wound is beginning to close, but there is still a lot of dead tissue at the base of the wound.  I told the patient that Dr. Zella Richer would address this at his appt next week.  Wound was packed with saline kerlix and covered with a thick gauze dressing and ABD pad.  The patient's skin is still very red and irritated.  In some areas it is "weeping".  The patient states it is itching a lot.  Otherwise, pt doing well.  He has been dressing the wound on the days that home health does not come, and is doing a great job.

## 2014-06-28 ENCOUNTER — Ambulatory Visit (INDEPENDENT_AMBULATORY_CARE_PROVIDER_SITE_OTHER): Payer: 59 | Admitting: General Surgery

## 2014-06-28 ENCOUNTER — Encounter (INDEPENDENT_AMBULATORY_CARE_PROVIDER_SITE_OTHER): Payer: Self-pay | Admitting: General Surgery

## 2014-06-28 VITALS — BP 130/78 | HR 60 | Resp 16 | Ht 73.0 in | Wt 306.6 lb

## 2014-06-28 DIAGNOSIS — Z4889 Encounter for other specified surgical aftercare: Secondary | ICD-10-CM

## 2014-06-28 NOTE — Patient Instructions (Signed)
May drive.  Continue light activities and current wound care.

## 2014-06-28 NOTE — Progress Notes (Signed)
Procedure:  Lap converted to open appendectomy for perforated appendicitis with necrosis  Date:  06/06/14  Pathology:  Appendicitis  History:  He is here for another postop visit.  Dressing is being changed twice a day and drainage is less. Exam: General- Is in NAD. Abd-Soft, wound is much cleaner with granulation tissue present. Assessment:  Postoperative wound infection with necrotic subcutaneous tissue-wound looks much better.  Plan: Continue current dressing changes.  RTC 2 weeks.

## 2014-06-30 ENCOUNTER — Other Ambulatory Visit (INDEPENDENT_AMBULATORY_CARE_PROVIDER_SITE_OTHER): Payer: 59

## 2014-06-30 DIAGNOSIS — Z0000 Encounter for general adult medical examination without abnormal findings: Secondary | ICD-10-CM

## 2014-06-30 LAB — LIPID PANEL
Cholesterol: 107 mg/dL (ref 0–200)
HDL: 32.7 mg/dL — ABNORMAL LOW (ref >39.00)
LDL Cholesterol: 48 mg/dL (ref 0–99)
NonHDL: 74.3
Total CHOL/HDL Ratio: 3
Triglycerides: 133 mg/dL (ref 0.0–149.0)
VLDL: 26.6 mg/dL (ref 0.0–40.0)

## 2014-06-30 LAB — CBC WITH DIFFERENTIAL/PLATELET
Basophils Absolute: 0 10*3/uL (ref 0.0–0.1)
Basophils Relative: 0.1 % (ref 0.0–3.0)
Eosinophils Absolute: 0.6 10*3/uL (ref 0.0–0.7)
Eosinophils Relative: 7.3 % — ABNORMAL HIGH (ref 0.0–5.0)
HCT: 45.5 % (ref 39.0–52.0)
Hemoglobin: 15.4 g/dL (ref 13.0–17.0)
Lymphocytes Relative: 20.1 % (ref 12.0–46.0)
Lymphs Abs: 1.7 10*3/uL (ref 0.7–4.0)
MCHC: 33.7 g/dL (ref 30.0–36.0)
MCV: 91.7 fl (ref 78.0–100.0)
Monocytes Absolute: 0.8 10*3/uL (ref 0.1–1.0)
Monocytes Relative: 9.9 % (ref 3.0–12.0)
Neutro Abs: 5.3 10*3/uL (ref 1.4–7.7)
Neutrophils Relative %: 62.6 % (ref 43.0–77.0)
Platelets: 155 10*3/uL (ref 150.0–400.0)
RBC: 4.97 Mil/uL (ref 4.22–5.81)
RDW: 13.2 % (ref 11.5–15.5)
WBC: 8.4 10*3/uL (ref 4.0–10.5)

## 2014-06-30 LAB — MICROALBUMIN / CREATININE URINE RATIO
Creatinine,U: 120.6 mg/dL
Microalb Creat Ratio: 2.4 mg/g (ref 0.0–30.0)
Microalb, Ur: 2.9 mg/dL — ABNORMAL HIGH (ref 0.0–1.9)

## 2014-06-30 LAB — PSA: PSA: 1.04 ng/mL (ref 0.10–4.00)

## 2014-06-30 LAB — BASIC METABOLIC PANEL
BUN: 18 mg/dL (ref 6–23)
CO2: 26 mEq/L (ref 19–32)
Calcium: 10.1 mg/dL (ref 8.4–10.5)
Chloride: 104 mEq/L (ref 96–112)
Creatinine, Ser: 0.9 mg/dL (ref 0.4–1.5)
GFR: 86.77 mL/min (ref >60.00)
Glucose, Bld: 119 mg/dL — ABNORMAL HIGH (ref 70–99)
Potassium: 4.7 mEq/L (ref 3.5–5.1)
Sodium: 142 mEq/L (ref 135–145)

## 2014-06-30 LAB — HEPATIC FUNCTION PANEL
ALT: 44 U/L (ref 0–53)
AST: 34 U/L (ref 0–37)
Albumin: 4.1 g/dL (ref 3.5–5.2)
Alkaline Phosphatase: 66 U/L (ref 39–117)
Bilirubin, Direct: 0.3 mg/dL (ref 0.0–0.3)
Total Bilirubin: 1.2 mg/dL (ref 0.2–1.2)
Total Protein: 7.1 g/dL (ref 6.0–8.3)

## 2014-06-30 LAB — HEMOGLOBIN A1C: Hgb A1c MFr Bld: 6.3 % (ref 4.6–6.5)

## 2014-06-30 LAB — TSH: TSH: 1.96 u[IU]/mL (ref 0.35–4.50)

## 2014-07-04 ENCOUNTER — Encounter: Payer: Self-pay | Admitting: Internal Medicine

## 2014-07-04 ENCOUNTER — Ambulatory Visit: Payer: 59 | Admitting: Family Medicine

## 2014-07-04 ENCOUNTER — Ambulatory Visit (INDEPENDENT_AMBULATORY_CARE_PROVIDER_SITE_OTHER): Payer: 59 | Admitting: Internal Medicine

## 2014-07-04 VITALS — BP 138/76 | Temp 98.3°F | Ht 72.0 in | Wt 303.5 lb

## 2014-07-04 DIAGNOSIS — E1159 Type 2 diabetes mellitus with other circulatory complications: Secondary | ICD-10-CM

## 2014-07-04 DIAGNOSIS — L03319 Cellulitis of trunk, unspecified: Secondary | ICD-10-CM

## 2014-07-04 DIAGNOSIS — Z7901 Long term (current) use of anticoagulants: Secondary | ICD-10-CM

## 2014-07-04 DIAGNOSIS — L02219 Cutaneous abscess of trunk, unspecified: Secondary | ICD-10-CM

## 2014-07-04 DIAGNOSIS — L02212 Cutaneous abscess of back [any part, except buttock]: Secondary | ICD-10-CM

## 2014-07-04 HISTORY — DX: Cutaneous abscess of back (any part, except buttock): L02.212

## 2014-07-04 MED ORDER — CEPHALEXIN 500 MG PO CAPS: 500.0000 mg | ORAL_CAPSULE | Freq: Three times a day (TID) | ORAL | Status: DC

## 2014-07-04 NOTE — Patient Instructions (Addendum)
appt tomorrow with surgeon of the day at 3 pm Dr Harlow Asa. Warm compresses  Contact on call service of fever chills  Add keflex for now .  appt tomorrow   Acts like a cyst with large abscess around this  About 6 cm

## 2014-07-04 NOTE — Progress Notes (Signed)
Pre visit review using our clinic review tool, if applicable. No additional management support is needed unless otherwise documented below in the visit note.   Chief Complaint  Patient presents with  . Bump on rt shoulder blade    HPI: Patient comes in today for SDA for  new problem evaluation. 63 year old gentleman recently discharged from the hospital for appendicitis with rupture and wound infection open procedure needed. He is diabetic on projects for A. fib controlled after cardioversion who noted over the weekend 2 days ago a sore spot on his upper back without associated fever and chills. He had seen a surgeon last week for wound infection and is due to have a followup visit next week   May have had a similar problem in the remote past  ended up with sounds like meeting and I and D. with packing. His son brings him in today although he is now just being able to drive. He has had no excess bleeding.  ROS: See pertinent positives and negatives per HPI. He has been losing weight intentionally 20 pounds and then more sense to surgery. no acute respiratory problems reported fluid overload in the hospital and IV antibiotics which looks like Zosyn that he tolerated okay he is able to take Keflex.   Past Medical History  Diagnosis Date  . HYPERLIPIDEMIA 03/22/2009  . Obesity, unspecified 04/24/2009  . THROMBOCYTOPENIA 08/16/2010  . ERECTILE DYSFUNCTION 03/22/2009  . SLEEP APNEA, OBSTRUCTIVE 03/22/2009    compliant with CPAP  . 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 Coronary atherosclerosis of native coronary artery 11/2002    a. s/p stent to LAD 12/03; OM2 occluded at cath 12/03; d. myoview 5/10: no ischemia;  e. echo 7/11: EF 55%, BAE, mild RVE, PASP 41-45; Myoview was in March 2013. There was no ischemia or infarction, EF 51%   . Atrial fibrillation 03/22/2009    a. s/p multiple DCCV; b. no coumadin due to low TE risk profile; c. Tikosyn Rx  . GERD 03/22/2009  . LATERAL EPICONDYLITIS, LEFT  10/24/2009  . HYPERGLYCEMIA 04/25/2010  . LIVER FUNCTION TESTS, ABNORMAL 04/25/2010  . TOBACCO USE, QUIT 10/24/2009  . Umbilical hernia   . Myocardial infarction mi2003  . Local reaction to immunization 05/05/2012    minor resolving  zostavax   . Numbness in left leg     foot related to back disease and surgery  . Diabetes mellitus without complication     Family History  Problem Relation Age of Onset  . Thyroid disease Mother   . Ovarian cancer Mother   . Breast cancer Mother   . Lung cancer Father   . Cancer Father     History   Social History  . Marital Status: Married    Spouse Name: N/A    Number of Children: N/A  . Years of Education: N/A   Social History Main Topics  . Smoking status: Former Smoker -- 2.00 packs/day for 42 years    Types: Cigarettes    Quit date: 12/30/2008  . Smokeless tobacco: Never Used     Comment: started at age 48; 1-2 ppd; quit 2010  . Alcohol Use: No  . Drug Use: No  . Sexual Activity: Yes   Other Topics Concern  . Not on file   Social History Narrative   Retired paramedic   Regular exercise-yes not recently    Has children   Wife is overweight and has fibromyalgia and depression on disability doesn't go out much. Back surgery  Has older dog   Retired from [employer] 31 years of service now 7 years     Outpatient Encounter Prescriptions as of 07/04/2014  Medication Sig  . atorvastatin (LIPITOR) 80 MG tablet Take 1 tablet by mouth  daily  . Blood Glucose Monitoring Suppl (ONETOUCH VERIO IQ SYSTEM) W/DEVICE KIT   . clotrimazole (LOTRIMIN) 1 % cream Apply 1 application topically daily as needed.  . dabigatran (PRADAXA) 150 MG CAPS capsule Take 150 mg by mouth 2 (two) times daily.  [name] dofetilide (TIKOSYN) 500 MCG capsule Take 500 mcg by mouth 2 (two) times daily.  . famotidine (PEPCID) 20 MG tablet Take 10-20 mg by mouth daily as needed for heartburn or indigestion.   . fish oil-omega-3 fatty acids 1000 MG capsule Take 1 g by mouth 2 (two)  times daily.   [name] MAGNESIUM OXIDE PO Take 750 mg by mouth every evening.   . metFORMIN (GLUCOPHAGE-XR) 500 MG 24 hr tablet Take 500 mg by mouth 2 (two) times daily.  . MULTIPLE VITAMIN PO Take 1 tablet by mouth every evening.   . nitroGLYCERIN (NITROSTAT) 0.4 MG SL tablet Place 1 tablet (0.4 mg total) under the tongue every 5 (five) minutes as needed.  . ondansetron (ZOFRAN) 4 MG tablet Take 1 tablet (4 mg total) by mouth every 8 (eight) hours as needed for nausea or vomiting.  [name] DELICA LANCETS 29H MISC   . ONETOUCH VERIO test strip   . oxyCODONE (OXY IR/ROXICODONE) 5 MG immediate release tablet Take 1-2 tablets (5-10 mg total) by mouth every 4 (four) hours as needed for moderate pain.  . pantoprazole (PROTONIX) 40 MG tablet Take 40 mg by mouth daily.  . potassium chloride SA (K-DUR,KLOR-CON) 20 MEQ tablet Take 20 mEq by mouth daily.  [name] zolpidem (AMBIEN) 10 MG tablet Take 10 mg by mouth at bedtime as needed for sleep.  . cephALEXin (KEFLEX) 500 MG capsule Take 1 capsule (500 mg total) by mouth 3 (three) times daily.    EXAM:  BP 138/76  Temp(Src) 98.3 F (36.8 C) (Oral)  Ht 6' (1.829 m)  Wt 303 lb 8 oz (137.667 kg)  BMI 41.15 kg/m2  Body mass index is 41.15 kg/(m^2).  GENERAL: vitals reviewed and listed above, alert, oriented, appears well hydrated and in no acute distress looks a bit tired nontoxic here with son HEENT: atraumatic, conjunctiva  clear, no obvious abnormalities on inspection of external nose and earsLUNGS: clear to auscultation bilaterally, no wheezes, rales or rhonchi, good air movement CV: HRRR, no clubbing cyanosis slight  peripheral edema nl cap refill  Right abdominal wound is covered and not checked today right upper back with 8 cm of erythema and 5-6 of induration with a central point possible pore  Tender  otherwise nonfluctuant some tiny blisters at distal end of these red area MS  : moves all extremities without noticeable focal  abnormality PSYCH:  pleasant and cooperative, no obvious depression or anxiety Wt Readings from Last 3 Encounters:  07/04/14 303 lb 8 oz (137.667 kg)  06/28/14 306 lb 9.6 oz (139.073 kg)  06/20/14 320 lb (145.151 kg)   Lab Results  Component Value Date   WBC 8.4 06/30/2014   HGB 15.4 06/30/2014   HCT 45.5 06/30/2014   PLT 155.0 06/30/2014   GLUCOSE 119* 06/30/2014   CHOL 107 06/30/2014   TRIG 133.0 06/30/2014   HDL 32.70* 06/30/2014   LDLCALC 48 06/30/2014   ALT 44 06/30/2014   AST 34 06/30/2014  NA 142 06/30/2014   K 4.7 06/30/2014   CL 104 06/30/2014   CREATININE 0.9 06/30/2014   BUN 18 06/30/2014   CO2 26 06/30/2014   TSH 1.96 06/30/2014   PSA 1.04 06/30/2014   INR 1.27 06/05/2014   HGBA1C 6.3 06/30/2014   MICROALBUR 2.9* 06/30/2014    ASSESSMENT AND PLAN:  Discussed the following assessment and plan:  Cutaneous abscess of back excluding buttocks - Appears to stem from possibly a cyst very large area 6 cm contact surgeon office  Chronic anticoagulation  Type 2 diabetes mellitus with other circulatory complications  Morbid obesity - improving  add antibiotic contact immediate systemic symptoms.  Has an appointment for a checkup the end of the week would keep his appointment at present unless not feeling well and can change it as a work in.   Labs reviewed. Have improved. With continued weight loss. -Patient advised to return or notify health care team  if symptoms worsen ,persist or new concerns arise.  Patient Instructions  appt tomorrow with surgeon of the day at 3 pm Dr Harlow Asa. Warm compresses  Contact on call service of fever chills  Add keflex for now .  appt tomorrow   Acts like a cyst with large abscess around this  About 6 cm    Standley Brooking. Panosh M.D.

## 2014-07-05 ENCOUNTER — Ambulatory Visit: Payer: 59 | Admitting: Internal Medicine

## 2014-07-05 ENCOUNTER — Telehealth: Payer: Self-pay | Admitting: Internal Medicine

## 2014-07-05 ENCOUNTER — Encounter (INDEPENDENT_AMBULATORY_CARE_PROVIDER_SITE_OTHER): Payer: Self-pay | Admitting: Surgery

## 2014-07-05 ENCOUNTER — Ambulatory Visit (INDEPENDENT_AMBULATORY_CARE_PROVIDER_SITE_OTHER): Payer: 59 | Admitting: Surgery

## 2014-07-05 VITALS — BP 148/80 | HR 56 | Temp 98.0°F | Resp 18 | Ht 73.0 in | Wt 303.8 lb

## 2014-07-05 DIAGNOSIS — L02212 Cutaneous abscess of back [any part, except buttock]: Secondary | ICD-10-CM | POA: Insufficient documentation

## 2014-07-05 DIAGNOSIS — L02219 Cutaneous abscess of trunk, unspecified: Secondary | ICD-10-CM

## 2014-07-05 DIAGNOSIS — L03319 Cellulitis of trunk, unspecified: Secondary | ICD-10-CM

## 2014-07-05 NOTE — Progress Notes (Signed)
General Surgery [facility]  Chief Complaint  Patient presents with  . New Evaluation    red and inflamed cyst on back - 6cm - referral from Dr. Regis Bill    HISTORY: Patient is a 63 year old male referred by his primary care physician for an acute abscess of the right upper back. This likely developed and the sebaceous cyst. He is developed induration and cellulitis. He was seen yesterday by his primary care physician. He was started on oral antibiotics. He is referred today for incision and drainage.  Patient is on anticoagulant for atrial fibrillation. He held his dosage last night and this morning.  Patient was recently admitted on the surgical service with complicated appendicitis.  Past Medical History  Diagnosis Date  . HYPERLIPIDEMIA 03/22/2009  . Obesity, unspecified 04/24/2009  . THROMBOCYTOPENIA 08/16/2010  . ERECTILE DYSFUNCTION 03/22/2009  . SLEEP APNEA, OBSTRUCTIVE 03/22/2009    compliant with CPAP  . 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 Coronary atherosclerosis of native coronary artery 11/2002    a. s/p stent to LAD 12/03; OM2 occluded at cath 12/03; d. myoview 5/10: no ischemia;  e. echo 7/11: EF 55%, BAE, mild RVE, PASP 41-45; Myoview was in March 2013. There was no ischemia or infarction, EF 51%   . Atrial fibrillation 03/22/2009    a. s/p multiple DCCV; b. no coumadin due to low TE risk profile; c. Tikosyn Rx  . GERD 03/22/2009  . LATERAL EPICONDYLITIS, LEFT 10/24/2009  . HYPERGLYCEMIA 04/25/2010  . LIVER FUNCTION TESTS, ABNORMAL 04/25/2010  . TOBACCO USE, QUIT 10/24/2009  . Umbilical hernia   . Myocardial infarction mi2003  . Local reaction to immunization 05/05/2012    minor resolving  zostavax   . Numbness in left leg     foot related to back disease and surgery  . Diabetes mellitus without complication     Current Outpatient Prescriptions  Medication Sig Dispense Refill  . atorvastatin (LIPITOR) 80 MG tablet Take 1 tablet by mouth  daily  90  tablet  0  . Blood Glucose Monitoring Suppl (ONETOUCH VERIO IQ SYSTEM) W/DEVICE KIT       . cephALEXin (KEFLEX) 500 MG capsule Take 1 capsule (500 mg total) by mouth 3 (three) times daily.  30 capsule  0  . clotrimazole (LOTRIMIN) 1 % cream Apply 1 application topically daily as needed.      . dabigatran (PRADAXA) 150 MG CAPS capsule Take 150 mg by mouth 2 (two) times daily.      [name] dofetilide (TIKOSYN) 500 MCG capsule Take 500 mcg by mouth 2 (two) times daily.      . famotidine (PEPCID) 20 MG tablet Take 10-20 mg by mouth daily as needed for heartburn or indigestion.       . fish oil-omega-3 fatty acids 1000 MG capsule Take 1 g by mouth 2 (two) times daily.       [name] MAGNESIUM OXIDE PO Take 750 mg by mouth every evening.       . metFORMIN (GLUCOPHAGE-XR) 500 MG 24 hr tablet Take 500 mg by mouth 2 (two) times daily.      . MULTIPLE VITAMIN PO Take 1 tablet by mouth every evening.       . nitroGLYCERIN (NITROSTAT) 0.4 MG SL tablet Place 1 tablet (0.4 mg total) under the tongue every 5 (five) minutes as needed.  25 tablet  11  . ondansetron (ZOFRAN) 4 MG tablet Take 1 tablet (4 mg total) by mouth every 8 (eight) hours  as needed for nausea or vomiting.  30 tablet  0  . ONETOUCH DELICA LANCETS 57Q MISC       . ONETOUCH VERIO test strip       . oxyCODONE (OXY IR/ROXICODONE) 5 MG immediate release tablet Take 1-2 tablets (5-10 mg total) by mouth every 4 (four) hours as needed for moderate pain.  50 tablet  0  . pantoprazole (PROTONIX) 40 MG tablet Take 40 mg by mouth daily.      . potassium chloride SA (K-DUR,KLOR-CON) 20 MEQ tablet Take 20 mEq by mouth daily.      [name] zolpidem (AMBIEN) 10 MG tablet Take 10 mg by mouth at bedtime as needed for sleep.       No current facility-administered medications for this visit.    Allergies  Allergen Reactions  . Penicillins     Childhood reaction   . Procaine Hcl Hives and Other (See Comments)    Red in the face and neck   . Pseudoephedrine Other (See  Comments)    Patient went into afib  . Sulfonamide Derivatives     Childhood reaction     Family History  Problem Relation Age of Onset  . Thyroid disease Mother   . Ovarian cancer Mother   . Breast cancer Mother   . Lung cancer Father   . Cancer Father     History   Social History  . Marital Status: Married    Spouse Name: N/A    Number of Children: N/A  . Years of Education: N/A   Social History Main Topics  . Smoking status: Former Smoker -- 2.00 packs/day for 42 years    Types: Cigarettes    Quit date: 12/30/2008  . Smokeless tobacco: Never Used     Comment: started at age 6; 1-2 ppd; quit 2010  . Alcohol Use: No  . Drug Use: No  . Sexual Activity: Yes   Other Topics Concern  . None   Social History Narrative   Retired paramedic   Regular exercise-yes not recently    Has children   Wife is overweight and has fibromyalgia and depression on disability doesn't go out much. Back surgery    Has older dog   Retired from [employer] 31 years of service now 7 years     REVIEW OF SYSTEMS - PERTINENT POSITIVES ONLY: Erythema, induration, in pain of 3 days' duration  EXAM: Filed Vitals:   07/05/14 1525  BP: 148/80  Pulse: 56  Temp: 98 F (36.7 C)  Resp: 18    GENERAL: well-developed, well-nourished, no acute distress HEENT: normocephalic; pupils equal and reactive; sclerae clear; dentition good; mucous membranes moist BACK:  In the right upper back overlying the scapula is an 8 x 4 cm area of violaceous discoloration, induration, and tenderness; there is slight fluctuance laterally NEURO: no gross focal deficits; no sign of tremor   PROCEDURE: Under aseptic conditions, the skin is anesthetized with local anesthetic with epinephrine. Using a #15 blade a 1 cm incision is made into the roof of the abscess cavity. Thick purulent fluid containing necrotic debris is evacuated with manipulation. Wound is packed with quadrant showed a 4 gauze packing and a dry gauze  dressing is applied. Procedure is well tolerated.  IMPRESSION: Abscess, right upper back  PLAN: Usual instructions for wound care given. Packing will be removed in 3 days. Patient will continue on antibiotics as prescribed by his primary care physician.  Patient will return to see Dr. Zella Richer in  one week.  Earnstine Regal, MD, [facility]  Primary Care Physician: Lottie Dawson, MD

## 2014-07-05 NOTE — Telephone Encounter (Signed)
Pt called to say that he had an abscess on his back and they lance and drain it packed it with a wick

## 2014-07-05 NOTE — Patient Instructions (Signed)
Change external gauze as needed for drainage.  Packing to be removed on Friday, 07/08/2014.  Cleanse the wound and shower with soap and water after packing removed. Continued to cover with dry gauze dressing until no further drainage.  Earnstine Regal, MD, [facility] Office: [phone]

## 2014-07-08 ENCOUNTER — Ambulatory Visit (INDEPENDENT_AMBULATORY_CARE_PROVIDER_SITE_OTHER): Payer: 59 | Admitting: Internal Medicine

## 2014-07-08 ENCOUNTER — Encounter: Payer: Self-pay | Admitting: Internal Medicine

## 2014-07-08 VITALS — BP 116/68 | Temp 98.1°F | Ht 71.75 in | Wt 297.9 lb

## 2014-07-08 DIAGNOSIS — I4891 Unspecified atrial fibrillation: Secondary | ICD-10-CM

## 2014-07-08 DIAGNOSIS — E785 Hyperlipidemia, unspecified: Secondary | ICD-10-CM

## 2014-07-08 DIAGNOSIS — I2510 Atherosclerotic heart disease of native coronary artery without angina pectoris: Secondary | ICD-10-CM

## 2014-07-08 DIAGNOSIS — Z7901 Long term (current) use of anticoagulants: Secondary | ICD-10-CM

## 2014-07-08 DIAGNOSIS — G4733 Obstructive sleep apnea (adult) (pediatric): Secondary | ICD-10-CM

## 2014-07-08 DIAGNOSIS — E1159 Type 2 diabetes mellitus with other circulatory complications: Secondary | ICD-10-CM

## 2014-07-08 DIAGNOSIS — E669 Obesity, unspecified: Secondary | ICD-10-CM

## 2014-07-08 DIAGNOSIS — L03319 Cellulitis of trunk, unspecified: Secondary | ICD-10-CM

## 2014-07-08 DIAGNOSIS — L02219 Cutaneous abscess of trunk, unspecified: Secondary | ICD-10-CM

## 2014-07-08 DIAGNOSIS — L02212 Cutaneous abscess of back [any part, except buttock]: Secondary | ICD-10-CM

## 2014-07-08 DIAGNOSIS — Z0000 Encounter for general adult medical examination without abnormal findings: Secondary | ICD-10-CM

## 2014-07-08 NOTE — Progress Notes (Signed)
Pre visit review using our clinic review tool, if applicable. No additional management support is needed unless otherwise documented below in the visit note.  Chief Complaint  Patient presents with  . Annual Exam    HPI: Patient comes in today for Preventive Health Care visit  Fu large abscess back seen by surgeon and recovering from  Appendectomy with wound infecion He has had his abscess packed with 15 inches of packing still some drainage to be checked in 3 or 4 days. No fever or chills pain is down. Blood pressures been good he is losing weight his breathing is better mood is much better since he is starting to recover from his appendectomy. Is been trying to lose weight to become a candidate for ablation so he can get off a number of the medicines from the AFIB. Tikosyn and pradaxa. Has had his eyes checked hearing no change no increasing numbness in his foot has decreased sensation around his left great toe from his previous back problem. No low blood sugars tends to be higher in the morning even though in the afternoon is much better.  Health Maintenance  Topic Date Due  . Influenza Vaccine  07/30/2014  . Tetanus/tdap  03/23/2019  . Colonoscopy  11/14/2021  . Zostavax  Completed   Health Maintenance Review LIFESTYLE:  Exercise:  Not recently avoiding heavy lifting Tobacco/ETS: no Alcohol: per day  Sugar beverages: No Sleep: Sleep apnea on treatment Drug use: no Colonoscopy: Up-to-date 2012. ROS:  GEN/ HEENT: No fever, significant weight changes sweats headaches vision problems hearing changes, CV/ PULM; No chest pain shortness of breath cough, syncope,edema  change in exercise tolerance. GI /GU: No adominal pain, vomiting, change in bowel habits. No blood in the stool. No significant GU symptoms. SKIN/HEME: ,no acute skin rashes suspicious lesions or bleeding. No lymphadenopathy, nodules, masses.  NEURO/ PSYCH:  No neurologic signs such as weakness  IMM/ Allergy: No  unusual infections.  Allergy .   REST of 12 system review negative except as per HPI   Past Medical History  Diagnosis Date  . HYPERLIPIDEMIA 03/22/2009  . Obesity, unspecified 04/24/2009  . THROMBOCYTOPENIA 08/16/2010  . ERECTILE DYSFUNCTION 03/22/2009  . SLEEP APNEA, OBSTRUCTIVE 03/22/2009    compliant with CPAP  . 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 Coronary atherosclerosis of native coronary artery 11/2002    a. s/p stent to LAD 12/03; OM2 occluded at cath 12/03; d. myoview 5/10: no ischemia;  e. echo 7/11: EF 55%, BAE, mild RVE, PASP 41-45; Myoview was in March 2013. There was no ischemia or infarction, EF 51%   . Atrial fibrillation 03/22/2009    a. s/p multiple DCCV; b. no coumadin due to low TE risk profile; c. Tikosyn Rx  . GERD 03/22/2009  . LATERAL EPICONDYLITIS, LEFT 10/24/2009  . HYPERGLYCEMIA 04/25/2010  . LIVER FUNCTION TESTS, ABNORMAL 04/25/2010  . TOBACCO USE, QUIT 10/24/2009  . Umbilical hernia   . Myocardial infarction mi2003  . Local reaction to immunization 05/05/2012    minor resolving  zostavax   . Numbness in left leg     foot related to back disease and surgery  . Diabetes mellitus without complication     Family History  Problem Relation Age of Onset  . Thyroid disease Mother   . Ovarian cancer Mother   . Breast cancer Mother   . Lung cancer Father   . Cancer Father     History   Social History  . Marital Status: Married  Spouse Name: N/A    Number of Children: N/A  . Years of Education: N/A   Social History Main Topics  . Smoking status: Former Smoker -- 2.00 packs/day for 42 years    Types: Cigarettes    Quit date: 12/30/2008  . Smokeless tobacco: Never Used     Comment: started at age 51; 1-2 ppd; quit 2010  . Alcohol Use: No  . Drug Use: No  . Sexual Activity: Yes   Other Topics Concern  . None   Social History Narrative   Retired paramedic   Regular exercise-yes not recently    Has children   Wife is overweight and has  fibromyalgia and depression on disability doesn't go out much. Back surgery    Has older dog   Retired from [employer] 31 years of service now 7 years     Outpatient Encounter Prescriptions as of 07/08/2014  Medication Sig  . atorvastatin (LIPITOR) 80 MG tablet Take 1 tablet by mouth  daily  . Blood Glucose Monitoring Suppl (ONETOUCH VERIO IQ SYSTEM) W/DEVICE KIT   . cephALEXin (KEFLEX) 500 MG capsule Take 1 capsule (500 mg total) by mouth 3 (three) times daily.  . clotrimazole (LOTRIMIN) 1 % cream Apply 1 application topically daily as needed.  . dabigatran (PRADAXA) 150 MG CAPS capsule Take 150 mg by mouth 2 (two) times daily.  [name] dofetilide (TIKOSYN) 500 MCG capsule Take 500 mcg by mouth 2 (two) times daily.  . famotidine (PEPCID) 20 MG tablet Take 10-20 mg by mouth daily as needed for heartburn or indigestion.   . fish oil-omega-3 fatty acids 1000 MG capsule Take 1 g by mouth 2 (two) times daily.   [name] MAGNESIUM OXIDE PO Take 750 mg by mouth every evening.   . metFORMIN (GLUCOPHAGE-XR) 500 MG 24 hr tablet Take 500 mg by mouth 2 (two) times daily.  . MULTIPLE VITAMIN PO Take 1 tablet by mouth every evening.   . nitroGLYCERIN (NITROSTAT) 0.4 MG SL tablet Place 1 tablet (0.4 mg total) under the tongue every 5 (five) minutes as needed.  . ondansetron (ZOFRAN) 4 MG tablet Take 1 tablet (4 mg total) by mouth every 8 (eight) hours as needed for nausea or vomiting.  [name] DELICA LANCETS 28B MISC   . ONETOUCH VERIO test strip   . oxyCODONE (OXY IR/ROXICODONE) 5 MG immediate release tablet Take 1-2 tablets (5-10 mg total) by mouth every 4 (four) hours as needed for moderate pain.  . pantoprazole (PROTONIX) 40 MG tablet Take 40 mg by mouth daily.  . potassium chloride SA (K-DUR,KLOR-CON) 20 MEQ tablet Take 20 mEq by mouth daily.  [name] zolpidem (AMBIEN) 10 MG tablet Take 10 mg by mouth at bedtime as needed for sleep.    EXAM:  BP 116/68  Temp(Src) 98.1 F (36.7 C) (Oral)  Ht 5' 11.75" (1.822 m)  Wt  297 lb 14.4 oz (135.127 kg)  BMI 40.70 kg/m2  Body mass index is 40.7 kg/(m^2).  Physical Exam: Vital signs reviewed TDV:VOHY is a well-developed well-nourished alert cooperative    who appearsr stated age in no acute distress.  Wears glasses HEENT: normocephalic atraumatic , Eyes: PERRL EOM's full, conjunctiva clear, Nares: paten,t no deformity discharge or tenderness., Ears: no deformity EAC's clear TMs with normal landmarks. Mouth: clear OP, no lesions, edema.  Moist mucous membranes. Dentition in adequate repair. NECK: supple without masses, thyromegaly or bruits. CHEST/PULM:  Clear to auscultation and percussion breath sounds equal no wheeze , rales or rhonchi. No  chest wall deformities or tenderness. CV: PMI is nondisplaced,RR S1 S2 no gallops, murmurs, rubs. Peripheral pulses are full without delay.No JVD . Som vv minimal edema ABDOMEN: Bowel sounds normal nontender right lower collagen bandage not uncovered  No guard or rebound, no hepato splenomegal no CVA tenderness.  No hernia. Obvious Extremtities:  No clubbing cyanosis or edema, no acute joint swelling or redness no focal atrophy NEURO:  Oriented x3, cranial nerves 3-12 appear to be intact, no obvious focal weakness,gait within normal limits no abnormal reflexes or asymmetrical diabetes foot exam normal sensation except for left great toe area SKIN: No acute rashes normal turgor, color, no bruising or petechiae. Abscess cyst wound evaluated packing removed a significant amount of pus and some cystic material was expressed after removal with mild discomfort. Small amount of bloody drainage the left redress PSYCH: Oriented, good eye contact, no obvious depression anxiety, cognition and judgment appear normal. LN: no cervical axillary inguinal adenopathy Diabetic foot exam. Lab Results  Component Value Date   WBC 8.4 06/30/2014   HGB 15.4 06/30/2014   HCT 45.5 06/30/2014   PLT 155.0 06/30/2014   GLUCOSE 119* 06/30/2014   CHOL 107 06/30/2014     TRIG 133.0 06/30/2014   HDL 32.70* 06/30/2014   LDLCALC 48 06/30/2014   ALT 44 06/30/2014   AST 34 06/30/2014   NA 142 06/30/2014   K 4.7 06/30/2014   CL 104 06/30/2014   CREATININE 0.9 06/30/2014   BUN 18 06/30/2014   CO2 26 06/30/2014   TSH 1.96 06/30/2014   PSA 1.04 06/30/2014   INR 1.27 06/05/2014   HGBA1C 6.3 06/30/2014   MICROALBUR 2.9* 06/30/2014    ASSESSMENT AND PLAN:  Discussed the following assessment and plan:  Visit for preventive health examination  HYPERLIPIDEMIA  Atrial fibrillation, unspecified - in sr currently   Coronary atherosclerosis of unspecified type of vessel, native or graft  Chronic anticoagulation  Obesity, unspecified  Type 2 diabetes mellitus with other circulatory complications - Much improved lifestyle intervention in his metformin. Congratulations keep going.!  SLEEP APNEA, OBSTRUCTIVE - on cpap  Morbid obesity - Much improving continue encouraged  Cutaneous abscess of back excluding buttocks - i and d per dr Harlow Asa  still lots of expressed pus but better  warm showers and compresses .   Patient Care Team: Burnis Medin, MD as PCP - General Juanita Craver, MD (Gastroenterology) Kathee Delton, MD (Pulmonary Disease) [facility] Grant Fontana., MD (Urology) Nira Retort, MD (Hematology and Oncology) Lelon Perla, MD (Cardiology) Hurman Horn, MD (Ophthalmology) Odis Hollingshead, MD as Consulting Physician (General Surgery) Patient Instructions   Continue lifestyle intervention healthy eating and exercise . continue weight loss .   Your diabetes is great . Local care to the abscess area  Can still  Drain.    Wt Readings from Last 3 Encounters:  07/08/14 297 lb 14.4 oz (135.127 kg)  07/05/14 303 lb 12.8 oz (137.803 kg)  07/04/14 303 lb 8 oz (137.667 kg)   ROV in 4 months  Labs pre visit .     Standley Brooking. Mandi Mattioli M.D.

## 2014-07-08 NOTE — Patient Instructions (Addendum)
Continue lifestyle intervention healthy eating and exercise . continue weight loss .   Your diabetes is great . Local care to the abscess area  Can still  Drain.    Wt Readings from Last 3 Encounters:  07/08/14 297 lb 14.4 oz (135.127 kg)  07/05/14 303 lb 12.8 oz (137.803 kg)  07/04/14 303 lb 8 oz (137.667 kg)   ROV in 4 months  Labs pre visit .

## 2014-07-11 ENCOUNTER — Ambulatory Visit (INDEPENDENT_AMBULATORY_CARE_PROVIDER_SITE_OTHER): Payer: 59 | Admitting: General Surgery

## 2014-07-11 ENCOUNTER — Encounter (INDEPENDENT_AMBULATORY_CARE_PROVIDER_SITE_OTHER): Payer: Self-pay | Admitting: General Surgery

## 2014-07-11 VITALS — BP 134/72 | HR 76 | Temp 98.0°F | Resp 18 | Ht 73.0 in | Wt 305.0 lb

## 2014-07-11 DIAGNOSIS — Z4889 Encounter for other specified surgical aftercare: Secondary | ICD-10-CM

## 2014-07-11 NOTE — Patient Instructions (Signed)
Continue current wound care

## 2014-07-11 NOTE — Progress Notes (Signed)
Procedure:  Lap converted to open appendectomy for perforated appendicitis with necrosis.  Incision and drainage of right upper back abscess by Dr. Harlow Asa 07/05/14.  Date:  06/06/14  Pathology:  Appendicitis  History:  He is here for another postop visit and a check of his right upper back wound.  He is having decreased drainage from his abdominal wound. He still having some whitish drainage from his back wound. Exam: General- Is in NAD. Abd-Soft, wound is smaller with granulation tissue present. Back-right upper back wound has some caseous material that I was able to express. I went ahead and removed as much of this as I could then cleaned with saline. It was covered with a dry dressing. Assessment:  Postoperative wound infection with necrotic subcutaneous tissue-wound closing in well  Infect right upper back cyst  Plan: Continue current care of wounds.  RTC 3 weeks.

## 2014-08-01 ENCOUNTER — Encounter (INDEPENDENT_AMBULATORY_CARE_PROVIDER_SITE_OTHER): Payer: Self-pay | Admitting: General Surgery

## 2014-08-01 ENCOUNTER — Ambulatory Visit (INDEPENDENT_AMBULATORY_CARE_PROVIDER_SITE_OTHER): Payer: 59 | Admitting: General Surgery

## 2014-08-01 ENCOUNTER — Encounter (INDEPENDENT_AMBULATORY_CARE_PROVIDER_SITE_OTHER): Payer: Self-pay

## 2014-08-01 VITALS — BP 130/70 | HR 64 | Resp 18 | Ht 73.0 in | Wt 305.4 lb

## 2014-08-01 DIAGNOSIS — L723 Sebaceous cyst: Secondary | ICD-10-CM

## 2014-08-01 DIAGNOSIS — L089 Local infection of the skin and subcutaneous tissue, unspecified: Secondary | ICD-10-CM

## 2014-08-01 MED ORDER — DOXYCYCLINE HYCLATE 100 MG PO TABS: 100.0000 mg | ORAL_TABLET | Freq: Two times a day (BID) | ORAL | Status: DC

## 2014-08-01 NOTE — Patient Instructions (Signed)
Stop Pradaxa.  Continue current dressings changes to abdominal wound.  Massage other infected cyst area with a warm moist cloth as much as possible.

## 2014-08-01 NOTE — Progress Notes (Signed)
Procedure:  Lap converted to open appendectomy for perforated appendicitis with necrosis.  Incision and drainage of right upper back abscess by Dr. Harlow Asa 07/05/14.  Date:  06/06/14  Pathology:  Appendicitis  History:  He is here for another postop visit and a check of his right upper back wound.  He is having decreased drainage from his abdominal wound. He has not had any drainage from the wound in his right upper back but states he has another swollen, red area in the left lower back region.  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  There was also a small lung nodule and repeat CT of the chest was recommended in 6-12 months. Exam: General- Is in NAD. Abd-Soft, wound is much smaller with granulation tissue present. Back-right upper back wound is almost completely healed although there is still a little bit of small white material that can be expressed. There is no erythema. In the left lower back area there is a 3-4 cm area of induration and erythema and a small scar present.   Assessment:  Postoperative wound infection with necrotic subcutaneous tissue-wound is healing in well.  Infect right upper back cyst-Very small amount of drainage, was almost completely healed. It appears he has another infected sebaceous cyst in the left lower back. He is currently on Pradaxa.  Plan: Stop Pradaxa.  Start doxycycline. An appointment in 3 days to see the area needs to be incised and drained. Continue current care to abdominal wound.  Will send a message to one of the vascular surgeons to see if the aneurysm needs to be followed.  As for the followup CT of the chest, I will leave this up to his primary care provider.

## 2014-08-02 ENCOUNTER — Encounter (INDEPENDENT_AMBULATORY_CARE_PROVIDER_SITE_OTHER): Payer: Self-pay | Admitting: General Surgery

## 2014-08-02 NOTE — Progress Notes (Signed)
Patient ID: [name] [name]., male   DOB: [birth year], 63 y.o.   MRN: [id number] I spoke with Dr. Scot Dock ([facility])  regarding his right common iliac artery aneurysm. He feels he could be seen in his office in one year with a repeat CT scan and they will arrange for that.

## 2014-08-03 ENCOUNTER — Other Ambulatory Visit: Payer: Self-pay | Admitting: *Deleted

## 2014-08-03 DIAGNOSIS — I723 Aneurysm of iliac artery: Secondary | ICD-10-CM

## 2014-08-04 ENCOUNTER — Other Ambulatory Visit (INDEPENDENT_AMBULATORY_CARE_PROVIDER_SITE_OTHER): Payer: Self-pay | Admitting: General Surgery

## 2014-08-04 ENCOUNTER — Ambulatory Visit (INDEPENDENT_AMBULATORY_CARE_PROVIDER_SITE_OTHER): Payer: 59 | Admitting: General Surgery

## 2014-08-04 ENCOUNTER — Encounter (INDEPENDENT_AMBULATORY_CARE_PROVIDER_SITE_OTHER): Payer: Self-pay | Admitting: General Surgery

## 2014-08-04 VITALS — BP 126/78 | HR 69 | Temp 98.0°F | Resp 18 | Ht 73.0 in | Wt 304.0 lb

## 2014-08-04 DIAGNOSIS — L089 Local infection of the skin and subcutaneous tissue, unspecified: Secondary | ICD-10-CM

## 2014-08-04 DIAGNOSIS — L723 Sebaceous cyst: Secondary | ICD-10-CM

## 2014-08-04 NOTE — Patient Instructions (Signed)
Restart Pradaxa tomorrow night.  Remove packing tomorrow night.  Clean area in shower and apply a dry, bulky dressing twice a day.

## 2014-08-04 NOTE — Progress Notes (Signed)
[name] [name] presents for incision and drainage of a left back abscess which is most likely an infected sebaceous cyst. He's been off his Pentasa. He's been taking his antibiotics. He says it still painful but the redness is less.  On examination erythema has decreased but there still fluctuance and tenderness. The right upper back wound is clean and healed without drainage.  Procedure: The area was sterilely prepped and anesthetized with Xylocaine. A full thickness elliptical incision was made through the skin. Evacuation of thick purulent material was performed some of which was sent for culture. I then identified the cyst capsule and continued to debride this to the best of my ability in a piecemeal fashion. Once this all was performed, the bone was then packed tightly with iodoform gauze followed by bulky dressing. He was instructed to restart his Pradaxa tomorrow night, remove the packing tomorrow night, and a clean area in the shower twice a day. Then apply a bulky dry dressing. Return visit 3 weeks.

## 2014-08-05 ENCOUNTER — Telehealth: Payer: Self-pay | Admitting: Vascular Surgery

## 2014-08-05 NOTE — Telephone Encounter (Signed)
Juliann Pulse spoke with patient to schedule, mailed paperwork and instructions, dpm

## 2014-08-05 NOTE — Telephone Encounter (Signed)
Message copied by Gena Fray on Fri Aug 05, 2014 10:28 AM ------      Message from: Angelia Mould      Created: Tue Aug 02, 2014 10:04 AM       Sherren Mocha,      The CT is under whelming. I would get a CT of ABD/Pelvis in 1 year and I could see him in a year. I will have my office arrange if you can just give him a heads up.      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  Is this something that needs to be followed?            Thx,            Todd R.       ------

## 2014-08-17 ENCOUNTER — Encounter (INDEPENDENT_AMBULATORY_CARE_PROVIDER_SITE_OTHER): Payer: Self-pay | Admitting: General Surgery

## 2014-08-26 ENCOUNTER — Other Ambulatory Visit: Payer: Self-pay | Admitting: Pulmonary Disease

## 2014-08-26 DIAGNOSIS — G4733 Obstructive sleep apnea (adult) (pediatric): Secondary | ICD-10-CM

## 2014-08-31 ENCOUNTER — Encounter (INDEPENDENT_AMBULATORY_CARE_PROVIDER_SITE_OTHER): Payer: 59 | Admitting: General Surgery

## 2014-09-03 ENCOUNTER — Other Ambulatory Visit: Payer: Self-pay | Admitting: Internal Medicine

## 2014-09-06 NOTE — Telephone Encounter (Signed)
Sent to the pharmacy by e-scribe. 

## 2014-09-14 ENCOUNTER — Other Ambulatory Visit: Payer: Self-pay | Admitting: Internal Medicine

## 2014-09-15 NOTE — Telephone Encounter (Signed)
Ok x 6 months 

## 2014-09-16 ENCOUNTER — Telehealth: Payer: Self-pay

## 2014-09-16 NOTE — Telephone Encounter (Signed)
Sent to the pharmacy by e-scribe.  See refill.

## 2014-09-16 NOTE — Telephone Encounter (Signed)
Sent to the pharmacy by e-scribe. 

## 2014-09-16 NOTE — Telephone Encounter (Signed)
Pt needs a refill on Pradaxa 150 mg  [facility]

## 2014-09-19 ENCOUNTER — Other Ambulatory Visit: Payer: Self-pay

## 2014-09-19 MED ORDER — POTASSIUM CHLORIDE CRYS ER 20 MEQ PO TBCR: 20.0000 meq | EXTENDED_RELEASE_TABLET | Freq: Every day | ORAL | Status: DC

## 2014-09-28 LAB — HM DIABETES EYE EXAM

## 2014-10-07 ENCOUNTER — Other Ambulatory Visit: Payer: Self-pay | Admitting: Cardiology

## 2014-10-10 ENCOUNTER — Other Ambulatory Visit: Payer: Self-pay

## 2014-10-10 MED ORDER — DOFETILIDE 500 MCG PO CAPS: 500.0000 ug | ORAL_CAPSULE | Freq: Two times a day (BID) | ORAL | Status: DC

## 2014-11-01 ENCOUNTER — Other Ambulatory Visit (INDEPENDENT_AMBULATORY_CARE_PROVIDER_SITE_OTHER): Payer: Medicare Other

## 2014-11-01 DIAGNOSIS — E119 Type 2 diabetes mellitus without complications: Secondary | ICD-10-CM

## 2014-11-01 DIAGNOSIS — I10 Essential (primary) hypertension: Secondary | ICD-10-CM

## 2014-11-01 DIAGNOSIS — E785 Hyperlipidemia, unspecified: Secondary | ICD-10-CM

## 2014-11-01 DIAGNOSIS — I2510 Atherosclerotic heart disease of native coronary artery without angina pectoris: Secondary | ICD-10-CM

## 2014-11-01 DIAGNOSIS — I4891 Unspecified atrial fibrillation: Secondary | ICD-10-CM

## 2014-11-01 LAB — BASIC METABOLIC PANEL
BUN: 14 mg/dL (ref 6–23)
CO2: 24 mEq/L (ref 19–32)
Calcium: 9.5 mg/dL (ref 8.4–10.5)
Chloride: 104 mEq/L (ref 96–112)
Creatinine, Ser: 0.8 mg/dL (ref 0.4–1.5)
GFR: 97.48 mL/min (ref >60.00)
Glucose, Bld: 114 mg/dL — ABNORMAL HIGH (ref 70–99)
Potassium: 4.5 mEq/L (ref 3.5–5.1)
Sodium: 138 mEq/L (ref 135–145)

## 2014-11-01 LAB — HEMOGLOBIN A1C: Hgb A1c MFr Bld: 6.4 % (ref 4.6–6.5)

## 2014-11-08 ENCOUNTER — Encounter: Payer: Self-pay | Admitting: Internal Medicine

## 2014-11-08 ENCOUNTER — Ambulatory Visit (INDEPENDENT_AMBULATORY_CARE_PROVIDER_SITE_OTHER): Payer: Medicare Other | Admitting: Internal Medicine

## 2014-11-08 ENCOUNTER — Ambulatory Visit (INDEPENDENT_AMBULATORY_CARE_PROVIDER_SITE_OTHER): Payer: Medicare Other | Admitting: Family Medicine

## 2014-11-08 VITALS — BP 132/70 | Temp 98.1°F | Ht 73.0 in | Wt 315.3 lb

## 2014-11-08 DIAGNOSIS — G479 Sleep disorder, unspecified: Secondary | ICD-10-CM

## 2014-11-08 DIAGNOSIS — E119 Type 2 diabetes mellitus without complications: Secondary | ICD-10-CM

## 2014-11-08 DIAGNOSIS — Z23 Encounter for immunization: Secondary | ICD-10-CM

## 2014-11-08 DIAGNOSIS — E785 Hyperlipidemia, unspecified: Secondary | ICD-10-CM

## 2014-11-08 DIAGNOSIS — Z7901 Long term (current) use of anticoagulants: Secondary | ICD-10-CM

## 2014-11-08 DIAGNOSIS — I723 Aneurysm of iliac artery: Secondary | ICD-10-CM

## 2014-11-08 MED ORDER — PANTOPRAZOLE SODIUM 40 MG PO TBEC: 40.0000 mg | DELAYED_RELEASE_TABLET | Freq: Every day | ORAL | Status: DC

## 2014-11-08 MED ORDER — DABIGATRAN ETEXILATE MESYLATE 150 MG PO CAPS: ORAL_CAPSULE | ORAL | Status: DC

## 2014-11-08 MED ORDER — ZOLPIDEM TARTRATE 10 MG PO TABS: 10.0000 mg | ORAL_TABLET | Freq: Every evening | ORAL | Status: DC | PRN

## 2014-11-08 NOTE — Patient Instructions (Signed)
Continue lifestyle intervention healthy eating and exercise . Labs are  In range .  6 months medicare wellness .

## 2014-11-08 NOTE — Progress Notes (Signed)
Pre visit review using our clinic review tool, if applicable. No additional management support is needed unless otherwise documented below in the visit note.  Chief Complaint  Patient presents with  . Follow-up  . Diabetes   [name] [name]. 63 y.o. comes in for   Chronic disease management  Has type 2 dm af HT  OSA and CAD with stent LAD 03   Who since last visit has done well his abdominal wound his healed and is been discharge from the surgical service.  Has had cysts needing i and d  that are now quiescent. No afib for 2  Years.  considering ablation   But weight is over 300 pounds. Needs a refill on the Pradaxa anticoagulant. To see vascular Dr. Doren Custard because of an incidental finding at 2.6 cm iliac aneurysm when CT was done for his ruptured appendix evaluation.   Of note he also had a simple renal cyst previously characterized by MRI as benign.  His blood sugars are good he had eye exam and is eating sent to [facility] for second opinion on some eye findings Vision no change ROS: See pertinent positives and negatives per HPI. Has been taking care of his wife who is too weak out rehabbing from a hip replacement. He is being on Medicare this month and had questions about medications coupons etc. Past Medical History  Diagnosis Date  . HYPERLIPIDEMIA 03/22/2009  . Obesity, unspecified 04/24/2009  . THROMBOCYTOPENIA 08/16/2010  . ERECTILE DYSFUNCTION 03/22/2009  . SLEEP APNEA, OBSTRUCTIVE 03/22/2009    compliant with CPAP  . 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 Coronary atherosclerosis of native coronary artery 11/2002    a. s/p stent to LAD 12/03; OM2 occluded at cath 12/03; d. myoview 5/10: no ischemia;  e. echo 7/11: EF 55%, BAE, mild RVE, PASP 41-45; Myoview was in March 2013. There was no ischemia or infarction, EF 51%   . Atrial fibrillation 03/22/2009    a. s/p multiple DCCV; b. no coumadin due to low TE risk profile; c. Tikosyn Rx  . GERD 03/22/2009  . LATERAL EPICONDYLITIS, LEFT  10/24/2009  . HYPERGLYCEMIA 04/25/2010  . LIVER FUNCTION TESTS, ABNORMAL 04/25/2010  . TOBACCO USE, QUIT 10/24/2009  . Umbilical hernia   . Myocardial infarction mi2003  . Local reaction to immunization 05/05/2012    minor resolving  zostavax   . Numbness in left leg     foot related to back disease and surgery  . Diabetes mellitus without complication   . Renal cyst     Characterized by MRI as simple  . Iliac aneurysm     2.6 to be evaluated incidental finding on CT  . Ruptured suppurative appendicitis     2015     Family History  Problem Relation Age of Onset  . Thyroid disease Mother   . Ovarian cancer Mother   . Breast cancer Mother   . Lung cancer Father   . Cancer Father     History   Social History  . Marital Status: Married    Spouse Name: N/A    Number of Children: N/A  . Years of Education: N/A   Social History Main Topics  . Smoking status: Former Smoker -- 2.00 packs/day for 42 years    Types: Cigarettes    Quit date: 12/30/2008  . Smokeless tobacco: Never Used     Comment: started at age 50; 1-2 ppd; quit 2010  . Alcohol Use: No  . Drug Use: No  .  Sexual Activity: Yes   Other Topics Concern  . None   Social History Narrative   Retired paramedic   Regular exercise-yes not recently    Has children   Wife is overweight and has fibromyalgia and depression on disability doesn't go out much. Back surgery    Has older dog   Retired from [employer] 31 years of service now 7 years     Outpatient Encounter Prescriptions as of 11/08/2014  Medication Sig  . atorvastatin (LIPITOR) 80 MG tablet Take 1 tablet by mouth  daily  . Blood Glucose Monitoring Suppl (ONETOUCH VERIO IQ SYSTEM) W/DEVICE KIT   . clotrimazole (LOTRIMIN) 1 % cream Apply 1 application topically daily as needed.  . dabigatran (PRADAXA) 150 MG CAPS capsule TAKE 1 CAPSULE BY MOUTH EVERY 12 HOURS  . dofetilide (TIKOSYN) 500 MCG capsule Take 1 capsule (500 mcg total) by mouth 2 (two) times daily.  .  famotidine (PEPCID) 20 MG tablet Take 10-20 mg by mouth daily as needed for heartburn or indigestion.   . fish oil-omega-3 fatty acids 1000 MG capsule Take 1 g by mouth 2 (two) times daily.   [name] MAGNESIUM OXIDE PO Take 750 mg by mouth every evening.   . metFORMIN (GLUCOPHAGE-XR) 500 MG 24 hr tablet Take 500 mg by mouth 2 (two) times daily.  . MULTIPLE VITAMIN PO Take 1 tablet by mouth every evening.   . nitroGLYCERIN (NITROSTAT) 0.4 MG SL tablet Place 1 tablet (0.4 mg total) under the tongue every 5 (five) minutes as needed.  [name] [name] DELICA LANCETS 59F MISC   . ONETOUCH VERIO test strip   . pantoprazole (PROTONIX) 40 MG tablet Take 1 tablet (40 mg total) by mouth daily.  . potassium chloride SA (K-DUR,KLOR-CON) 20 MEQ tablet Take 1 tablet (20 mEq total) by mouth daily.  [name] zolpidem (AMBIEN) 10 MG tablet Take 1 tablet (10 mg total) by mouth at bedtime as needed for sleep.  . [DISCONTINUED] dabigatran (PRADAXA) 150 MG CAPS capsule Take 150 mg by mouth 2 (two) times daily.  . [DISCONTINUED] pantoprazole (PROTONIX) 40 MG tablet Take 40 mg by mouth daily.  . [DISCONTINUED] PRADAXA 150 MG CAPS capsule TAKE 1 CAPSULE BY MOUTH EVERY 12 HOURS  . [DISCONTINUED] zolpidem (AMBIEN) 10 MG tablet Take 10 mg by mouth at bedtime as needed for sleep.  . [DISCONTINUED] cephALEXin (KEFLEX) 500 MG capsule Take 1 capsule (500 mg total) by mouth 3 (three) times daily.  . [DISCONTINUED] doxycycline (VIBRA-TABS) 100 MG tablet Take 1 tablet (100 mg total) by mouth 2 (two) times daily.  . [DISCONTINUED] ondansetron (ZOFRAN) 4 MG tablet Take 1 tablet (4 mg total) by mouth every 8 (eight) hours as needed for nausea or vomiting.  . [DISCONTINUED] oxyCODONE (OXY IR/ROXICODONE) 5 MG immediate release tablet Take 1-2 tablets (5-10 mg total) by mouth every 4 (four) hours as needed for moderate pain.    EXAM:  BP 132/70 mmHg  Temp(Src) 98.1 F (36.7 C) (Oral)  Ht 6' 1"  (1.854 m)  Wt 315 lb 4.8 oz (143.019 kg)  BMI 41.61  kg/m2  Body mass index is 41.61 kg/(m^2).  GENERAL: vitals reviewed and listed above, alert, oriented, appears well hydrated and in no acute distress HEENT: atraumatic, conjunctiva  clear, no obvious abnormalities on inspection of external nose and ears  NECK: no obvious masses on inspection palpation  LUNGS: clear to auscultation bilaterally, no wheezes, rales or rhonchi, good air movement CV: HRRR, no clubbing cyanosis or  peripheral edema nl cap  refill  Abdomen well-healed large right side scar with no obvious hernia no masses no pain. MS: moves all extremities without noticeable focal  abnormality PSYCH: pleasant and cooperative, no obvious depression or anxiety  See foot exam.  Lab Results  Component Value Date   WBC 8.4 06/30/2014   HGB 15.4 06/30/2014   HCT 45.5 06/30/2014   PLT 155.0 06/30/2014   GLUCOSE 114* 11/01/2014   CHOL 107 06/30/2014   TRIG 133.0 06/30/2014   HDL 32.70* 06/30/2014   LDLCALC 48 06/30/2014   ALT 44 06/30/2014   AST 34 06/30/2014   NA 138 11/01/2014   K 4.5 11/01/2014   CL 104 11/01/2014   CREATININE 0.8 11/01/2014   BUN 14 11/01/2014   CO2 24 11/01/2014   TSH 1.96 06/30/2014   PSA 1.04 06/30/2014   INR 1.27 06/05/2014   HGBA1C 6.4 11/01/2014   MICROALBUR 2.9* 06/30/2014   Lab Results  Component Value Date   HGBA1C 6.4 11/01/2014   HGBA1C 6.3 06/30/2014   HGBA1C 6.5* 06/05/2014   Lab Results  Component Value Date   MICROALBUR 2.9* 06/30/2014   LDLCALC 48 06/30/2014   CREATININE 0.8 11/01/2014    ASSESSMENT AND PLAN:  Discussed the following assessment and plan:  Diabetes mellitus without complication - ccontrolled under I evaluation does have vascular disease  stable  Chronic anticoagulation  Sleep disorder - which is refill of Ambien  Hyperlipidemia - contniue meds at goal   Morbid obesity  Iliac aneurysm Discussed above discussed change to Medicare medication management limitations. Work on decreasing weight  fortunately diabetes is in control and really in the prediabetic range at this time. -Patient advised to return or notify health care team  if symptoms worsen ,persist or new concerns arise.  Patient Instructions  Continue lifestyle intervention healthy eating and exercise . Labs are  In range .  6 months medicare wellness .     Standley Brooking. Lovey Crupi M.D.  Total visit 24mns > 50% spent counseling and coordinating care

## 2014-11-15 NOTE — Progress Notes (Signed)
HPI: FU hx of CAD s/p stent to LAD in 2003, parox AFib, HL, OSA. He underwent DCCV in 05/2012 for AFib. He has seen Dr. Thompson Grayer and ablation could be considered if he lost weight. He has maintained NSR on Tikosyn. Abd CT 06/04/14 showed 2.1 cm iliac aneurysm and pleural scarring right base (fu recommended 6-12 months). Since last seen, the patient denies any dyspnea on exertion, orthopnea, PND, pedal edema, palpitations, syncope or chest pain.    Studies: - Echo (06/2010): EF 55%, mild dilated aortic root, MAC, mild LAE, mild RVE, mild RAE, PASP 41-45 mmHg - Nuclear (03/2012): No ischemia, EF 51%; Normal  Current Outpatient Prescriptions  Medication Sig Dispense Refill  . atorvastatin (LIPITOR) 80 MG tablet Take 1 tablet by mouth  daily 90 tablet 2  . Blood Glucose Monitoring Suppl (ONETOUCH VERIO IQ SYSTEM) W/DEVICE KIT     . clotrimazole (LOTRIMIN) 1 % cream Apply 1 application topically daily as needed.    . dabigatran (PRADAXA) 150 MG CAPS capsule TAKE 1 CAPSULE BY MOUTH EVERY 12 HOURS 180 capsule 3  . dofetilide (TIKOSYN) 500 MCG capsule Take 1 capsule (500 mcg total) by mouth 2 (two) times daily. 60 capsule 6  . famotidine (PEPCID) 20 MG tablet Take 10-20 mg by mouth daily as needed for heartburn or indigestion.     . fish oil-omega-3 fatty acids 1000 MG capsule Take 1 g by mouth 2 (two) times daily.     [name] MAGNESIUM OXIDE PO Take 750 mg by mouth every evening.     . metFORMIN (GLUCOPHAGE-XR) 500 MG 24 hr tablet Take 500 mg by mouth 2 (two) times daily.    . MULTIPLE VITAMIN PO Take 1 tablet by mouth every evening.     . nitroGLYCERIN (NITROSTAT) 0.4 MG SL tablet Place 1 tablet (0.4 mg total) under the tongue every 5 (five) minutes as needed. 25 tablet 11  . ONETOUCH DELICA LANCETS 10O MISC     . ONETOUCH VERIO test strip     . pantoprazole (PROTONIX) 40 MG tablet Take 1 tablet (40 mg total) by mouth daily. 90 tablet 1  . potassium chloride SA (K-DUR,KLOR-CON) 20 MEQ  tablet Take 1 tablet (20 mEq total) by mouth daily. 30 tablet 3  . zolpidem (AMBIEN) 10 MG tablet Take 1 tablet (10 mg total) by mouth at bedtime as needed for sleep. 30 tablet 5   No current facility-administered medications for this visit.     Past Medical History  Diagnosis Date  . HYPERLIPIDEMIA 03/22/2009  . Obesity, unspecified 04/24/2009  . THROMBOCYTOPENIA 08/16/2010  . ERECTILE DYSFUNCTION 03/22/2009  . SLEEP APNEA, OBSTRUCTIVE 03/22/2009    compliant with CPAP  . 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 Coronary atherosclerosis of native coronary artery 11/2002    a. s/p stent to LAD 12/03; OM2 occluded at cath 12/03; d. myoview 5/10: no ischemia;  e. echo 7/11: EF 55%, BAE, mild RVE, PASP 41-45; Myoview was in March 2013. There was no ischemia or infarction, EF 51%   . Atrial fibrillation 03/22/2009    a. s/p multiple DCCV; b. no coumadin due to low TE risk profile; c. Tikosyn Rx  . GERD 03/22/2009  . LATERAL EPICONDYLITIS, LEFT 10/24/2009  . HYPERGLYCEMIA 04/25/2010  . LIVER FUNCTION TESTS, ABNORMAL 04/25/2010  . TOBACCO USE, QUIT 10/24/2009  . Umbilical hernia   . Myocardial infarction mi2003  . Local reaction to immunization 05/05/2012    minor resolving  zostavax   .  Numbness in left leg     foot related to back disease and surgery  . Diabetes mellitus without complication   . Renal cyst     Characterized by MRI as simple  . Iliac aneurysm     2.6 to be evaluated incidental finding on CT  . Ruptured suppurative appendicitis     2015     Past Surgical History  Procedure Laterality Date  . Cyst on epiglottis  08/2002  . Surgery l4-l5   1998    ruptured x 3  . Stent      LAD DUS 2004  . Ulnar neuropathy    . Umbilical hernia repair      mesh  . Lumbar disc surgery      two  holes in spinalcovering  . Esophagogastroduodenoscopy  11/15/2011    Procedure: ESOPHAGOGASTRODUODENOSCOPY (EGD);  Surgeon: Juanita Craver, MD;  Location: [facility];  Service: Endoscopy;   Laterality: N/A;  . Colonoscopy  11/15/2011    Procedure: COLONOSCOPY;  Surgeon: Juanita Craver, MD;  Location: [facility];  Service: Endoscopy;  Laterality: N/A;  . Laparoscopic appendectomy N/A 06/06/2014    Procedure: APPENDECTOMY LAPAROSCOPIC attemted;  Surgeon: Odis Hollingshead, MD;  Location: [facility];  Service: General;  Laterality: N/A;  . Appendectomy N/A 06/06/2014    Procedure: APPENDECTOMY;  Surgeon: Odis Hollingshead, MD;  Location: [facility];  Service: General;  Laterality: N/A;  . Colon surgery      History   Social History  . Marital Status: Married    Spouse Name: N/A    Number of Children: N/A  . Years of Education: N/A   Occupational History  . Not on file.   Social History Main Topics  . Smoking status: Former Smoker -- 2.00 packs/day for 42 years    Types: Cigarettes    Quit date: 12/30/2008  . Smokeless tobacco: Never Used     Comment: started at age 14; 1-2 ppd; quit 2010  . Alcohol Use: No  . Drug Use: No  . Sexual Activity: Yes   Other Topics Concern  . Not on file   Social History Narrative   Retired paramedic   Regular exercise-yes not recently    Has children   Wife is overweight and has fibromyalgia and depression on disability doesn't go out much. Back surgery    Has older dog   Retired from [employer] 31 years of service now 7 years     ROS: no fevers or chills, productive cough, hemoptysis, dysphasia, odynophagia, melena, hematochezia, dysuria, hematuria, rash, seizure activity, orthopnea, PND, pedal edema, claudication. Remaining systems are negative.  Physical Exam: Well-developed obese in no acute distress.  Skin is warm and dry.  HEENT is normal.  Neck is supple.  Chest is clear to auscultation with normal expansion.  Cardiovascular exam is regular rate and rhythm.  Abdominal exam nontender or distended. No masses palpated. Extremities show no edema. neuro grossly intact  ECG Sinus rhythm at a rate of 67. Left axis deviation. No ST  changes.

## 2014-11-17 ENCOUNTER — Encounter: Payer: Self-pay | Admitting: Internal Medicine

## 2014-11-17 ENCOUNTER — Encounter: Payer: Self-pay | Admitting: Cardiology

## 2014-11-17 ENCOUNTER — Ambulatory Visit (INDEPENDENT_AMBULATORY_CARE_PROVIDER_SITE_OTHER): Payer: Medicare Other | Admitting: Cardiology

## 2014-11-17 VITALS — BP 120/80 | HR 68 | Ht 73.0 in | Wt 317.9 lb

## 2014-11-17 DIAGNOSIS — I2510 Atherosclerotic heart disease of native coronary artery without angina pectoris: Secondary | ICD-10-CM

## 2014-11-17 DIAGNOSIS — I4891 Unspecified atrial fibrillation: Secondary | ICD-10-CM

## 2014-11-17 DIAGNOSIS — E785 Hyperlipidemia, unspecified: Secondary | ICD-10-CM

## 2014-11-17 DIAGNOSIS — I723 Aneurysm of iliac artery: Secondary | ICD-10-CM

## 2014-11-17 NOTE — Assessment & Plan Note (Signed)
Continue statin. 

## 2014-11-17 NOTE — Patient Instructions (Signed)
Your physician wants you to follow-up in: [place] will receive a reminder letter in the mail two months in advance. If you don't receive a letter, please call our office to schedule the follow-up appointment.   Your physician recommends that you return for lab work in: 6 MONTHS=BMP/CBC/MAG

## 2014-11-17 NOTE — Telephone Encounter (Signed)
[name] can we make sure these are done q 6 months ? Thanks W

## 2014-11-17 NOTE — Assessment & Plan Note (Signed)
Continue tikosyn and pradaxa; he will need potassium, renal function, magnesium Every 6 months and this is being performed at primary care's office.

## 2014-11-17 NOTE — Assessment & Plan Note (Signed)
Continue statin. Not on aspirin given need for anticoagulation. 

## 2014-11-17 NOTE — Assessment & Plan Note (Signed)
Patient states he will follow-up with vascular surgery concerning this issue. He also states he will follow-up with pulmonary in January for scarring in his long and need for repeat CT.

## 2014-11-18 ENCOUNTER — Other Ambulatory Visit: Payer: Self-pay | Admitting: Family Medicine

## 2014-11-18 NOTE — Telephone Encounter (Signed)
   Diabetes tylpe 2   thrombocytopenia   anticoagulation use  These are in the problem list and can use these

## 2014-11-21 ENCOUNTER — Other Ambulatory Visit: Payer: Self-pay | Admitting: Family Medicine

## 2014-11-21 DIAGNOSIS — E118 Type 2 diabetes mellitus with unspecified complications: Secondary | ICD-10-CM

## 2014-11-21 DIAGNOSIS — Z7901 Long term (current) use of anticoagulants: Secondary | ICD-10-CM

## 2014-11-21 DIAGNOSIS — D696 Thrombocytopenia, unspecified: Secondary | ICD-10-CM

## 2014-12-12 ENCOUNTER — Other Ambulatory Visit: Payer: Self-pay | Admitting: *Deleted

## 2014-12-12 MED ORDER — POTASSIUM CHLORIDE CRYS ER 20 MEQ PO TBCR: 20.0000 meq | EXTENDED_RELEASE_TABLET | Freq: Every day | ORAL | Status: DC

## 2015-01-13 DIAGNOSIS — H47322 Drusen of optic disc, left eye: Secondary | ICD-10-CM | POA: Insufficient documentation

## 2015-01-20 ENCOUNTER — Ambulatory Visit: Payer: 59 | Admitting: Pulmonary Disease

## 2015-01-23 ENCOUNTER — Encounter: Payer: Self-pay | Admitting: Pulmonary Disease

## 2015-01-23 ENCOUNTER — Ambulatory Visit (INDEPENDENT_AMBULATORY_CARE_PROVIDER_SITE_OTHER): Payer: Medicare Other | Admitting: Pulmonary Disease

## 2015-01-23 VITALS — BP 124/72 | HR 70 | Temp 97.1°F | Ht 74.0 in | Wt 329.8 lb

## 2015-01-23 DIAGNOSIS — G4733 Obstructive sleep apnea (adult) (pediatric): Secondary | ICD-10-CM

## 2015-01-23 NOTE — Assessment & Plan Note (Signed)
The pt continues to do well on cpap, and feels satisfied with his sleep and daytime alertness.  He is having no tolerance issues, and has lost 20 pounds since the last visit.  I have asked him to continue on his device, and to keep up with his mask changes and supplies.

## 2015-01-23 NOTE — Patient Instructions (Signed)
Continue to keep up with mask changes and supplies. Keep working on weight loss followup with me again in one year.

## 2015-01-23 NOTE — Progress Notes (Signed)
   Subjective:    Patient ID: [name] [name]., male    DOB: 12-Sep-1951, 64 y.o.   MRN: [id number]  HPI The patient comes in today for follow-up of his obstructive sleep apnea. He is wearing C Pap compliantly, and is having no issues with his mask fit or pressure. He has been keeping up with his mask changes and supplies, and is satisfied with his daytime alertness. He lost 20 pounds after having a complication from appendicitis last summer, but has kept the weight off. He is working toward further weight loss.   Review of Systems  Constitutional: Negative for fever and unexpected weight change.  HENT: Negative for congestion, dental problem, ear pain, nosebleeds, postnasal drip, rhinorrhea, sinus pressure, sneezing, sore throat and trouble swallowing.   Eyes: Negative for redness and itching.  Respiratory: Negative for cough, chest tightness, shortness of breath and wheezing.   Cardiovascular: Negative for palpitations and leg swelling.  Gastrointestinal: Negative for nausea and vomiting.  Genitourinary: Negative for dysuria.  Musculoskeletal: Negative for joint swelling.  Skin: Negative for rash.  Neurological: Negative for headaches.  Hematological: Does not bruise/bleed easily.  Psychiatric/Behavioral: Negative for dysphoric mood. The patient is not nervous/anxious.        Objective:   Physical Exam Obese male, no acute distress. Nose without purulence or d/c noted. No skin breakdown or pressure necrosis from the cpap mask. Neck without LN or TMG LE with mild edema, no cyanosis Alert and oriented, moves all 4, does not appear to be sleepy.        Assessment & Plan:

## 2015-01-27 ENCOUNTER — Other Ambulatory Visit: Payer: Self-pay | Admitting: *Deleted

## 2015-01-27 ENCOUNTER — Other Ambulatory Visit: Payer: Self-pay

## 2015-01-27 MED ORDER — POTASSIUM CHLORIDE CRYS ER 20 MEQ PO TBCR: 20.0000 meq | EXTENDED_RELEASE_TABLET | Freq: Every day | ORAL | Status: DC

## 2015-01-27 NOTE — Telephone Encounter (Signed)
Pharmacy requests a refill on Metformin ER 500mg -#90

## 2015-01-30 MED ORDER — METFORMIN HCL ER 500 MG PO TB24: 500.0000 mg | ORAL_TABLET | Freq: Two times a day (BID) | ORAL | Status: DC

## 2015-01-30 NOTE — Telephone Encounter (Signed)
Sent to the pharmacy by e-scribe. 

## 2015-01-31 MED ORDER — METFORMIN HCL ER 500 MG PO TB24: 500.0000 mg | ORAL_TABLET | Freq: Two times a day (BID) | ORAL | Status: DC

## 2015-01-31 NOTE — Addendum Note (Signed)
Addended by: Colleen Can on: 01/31/2015 03:19 PM   Modules accepted: Orders

## 2015-02-03 ENCOUNTER — Telehealth: Payer: Self-pay | Admitting: Internal Medicine

## 2015-02-03 ENCOUNTER — Other Ambulatory Visit: Payer: Self-pay | Admitting: Internal Medicine

## 2015-02-03 NOTE — Telephone Encounter (Signed)
PA for Pradaxa was approved. 02/03/15-02/04/16 [id number]

## 2015-02-03 NOTE — Telephone Encounter (Signed)
PA for zolpidem was denied.  Patient must try and fail Belsomra AND Rozerem first.

## 2015-02-03 NOTE — Telephone Encounter (Signed)
Please  Tell pateint of the denial   May have to wean medication to change  And use everu other night  And switch as directed

## 2015-02-06 NOTE — Telephone Encounter (Signed)
Need more precise directions. Take every other night for how long?  1-2 wks?

## 2015-02-07 MED ORDER — SUVOREXANT 10 MG PO TABS: 10.0000 mg | ORAL_TABLET | Freq: Every evening | ORAL | Status: DC | PRN

## 2015-02-07 NOTE — Telephone Encounter (Signed)
Called to the pharmacy and left on machine. 

## 2015-02-07 NOTE — Telephone Encounter (Signed)
Can just do a straight switch but may have  Temporary  insomnia during the transition phase  About 1-2 week.s

## 2015-02-22 ENCOUNTER — Encounter: Payer: Self-pay | Admitting: Cardiology

## 2015-02-22 MED ORDER — DOFETILIDE 500 MCG PO CAPS: 500.0000 ug | ORAL_CAPSULE | Freq: Two times a day (BID) | ORAL | Status: DC

## 2015-03-14 ENCOUNTER — Other Ambulatory Visit: Payer: Self-pay | Admitting: Internal Medicine

## 2015-03-14 NOTE — Telephone Encounter (Signed)
Sent to the pharmacy by e-scribe. 

## 2015-03-26 ENCOUNTER — Encounter: Payer: Self-pay | Admitting: Internal Medicine

## 2015-04-06 ENCOUNTER — Ambulatory Visit: Payer: Medicare Other | Admitting: Family Medicine

## 2015-04-06 DIAGNOSIS — Z23 Encounter for immunization: Secondary | ICD-10-CM

## 2015-04-06 NOTE — Progress Notes (Deleted)
Error

## 2015-04-17 ENCOUNTER — Other Ambulatory Visit: Payer: Self-pay | Admitting: Internal Medicine

## 2015-05-02 ENCOUNTER — Other Ambulatory Visit (INDEPENDENT_AMBULATORY_CARE_PROVIDER_SITE_OTHER): Payer: Medicare Other

## 2015-05-02 ENCOUNTER — Encounter: Payer: Self-pay | Admitting: Internal Medicine

## 2015-05-02 DIAGNOSIS — E118 Type 2 diabetes mellitus with unspecified complications: Secondary | ICD-10-CM

## 2015-05-02 DIAGNOSIS — D696 Thrombocytopenia, unspecified: Secondary | ICD-10-CM

## 2015-05-02 DIAGNOSIS — Z7901 Long term (current) use of anticoagulants: Secondary | ICD-10-CM

## 2015-05-02 LAB — CBC WITH DIFFERENTIAL/PLATELET
Basophils Absolute: 0 10*3/uL (ref 0.0–0.1)
Basophils Relative: 0.3 % (ref 0.0–3.0)
Eosinophils Absolute: 0.6 10*3/uL (ref 0.0–0.7)
Eosinophils Relative: 7.7 % — ABNORMAL HIGH (ref 0.0–5.0)
HCT: 48.9 % (ref 39.0–52.0)
Hemoglobin: 16.9 g/dL (ref 13.0–17.0)
Lymphocytes Relative: 29.7 % (ref 12.0–46.0)
Lymphs Abs: 2.4 10*3/uL (ref 0.7–4.0)
MCHC: 34.5 g/dL (ref 30.0–36.0)
MCV: 91.3 fl (ref 78.0–100.0)
Monocytes Absolute: 0.6 10*3/uL (ref 0.1–1.0)
Monocytes Relative: 7.6 % (ref 3.0–12.0)
Neutro Abs: 4.4 10*3/uL (ref 1.4–7.7)
Neutrophils Relative %: 54.7 % (ref 43.0–77.0)
Platelets: 135 10*3/uL — ABNORMAL LOW (ref 150.0–400.0)
RBC: 5.36 Mil/uL (ref 4.22–5.81)
RDW: 13.4 % (ref 11.5–15.5)
WBC: 8.1 10*3/uL (ref 4.0–10.5)

## 2015-05-02 LAB — HEMOGLOBIN A1C: Hgb A1c MFr Bld: 7.2 % — ABNORMAL HIGH (ref 4.6–6.5)

## 2015-05-02 LAB — BASIC METABOLIC PANEL
BUN: 19 mg/dL (ref 6–23)
CO2: 27 mEq/L (ref 19–32)
Calcium: 9.5 mg/dL (ref 8.4–10.5)
Chloride: 103 mEq/L (ref 96–112)
Creatinine, Ser: 0.9 mg/dL (ref 0.40–1.50)
GFR: 89.88 mL/min (ref >60.00)
Glucose, Bld: 133 mg/dL — ABNORMAL HIGH (ref 70–99)
Potassium: 4.2 mEq/L (ref 3.5–5.1)
Sodium: 137 mEq/L (ref 135–145)

## 2015-05-02 LAB — MAGNESIUM: Magnesium: 2 mg/dL (ref 1.5–2.5)

## 2015-05-09 ENCOUNTER — Ambulatory Visit (INDEPENDENT_AMBULATORY_CARE_PROVIDER_SITE_OTHER): Payer: Medicare Other | Admitting: Internal Medicine

## 2015-05-09 ENCOUNTER — Encounter: Payer: Self-pay | Admitting: Internal Medicine

## 2015-05-09 VITALS — BP 130/70 | Temp 98.4°F | Ht 71.5 in | Wt 331.8 lb

## 2015-05-09 DIAGNOSIS — E1159 Type 2 diabetes mellitus with other circulatory complications: Secondary | ICD-10-CM | POA: Diagnosis not present

## 2015-05-09 DIAGNOSIS — Z0000 Encounter for general adult medical examination without abnormal findings: Secondary | ICD-10-CM | POA: Diagnosis not present

## 2015-05-09 DIAGNOSIS — E785 Hyperlipidemia, unspecified: Secondary | ICD-10-CM

## 2015-05-09 DIAGNOSIS — G479 Sleep disorder, unspecified: Secondary | ICD-10-CM

## 2015-05-09 DIAGNOSIS — Z7901 Long term (current) use of anticoagulants: Secondary | ICD-10-CM

## 2015-05-09 DIAGNOSIS — Z23 Encounter for immunization: Secondary | ICD-10-CM | POA: Diagnosis not present

## 2015-05-09 MED ORDER — METFORMIN HCL ER 500 MG PO TB24: 500.0000 mg | ORAL_TABLET | Freq: Three times a day (TID) | ORAL | Status: DC

## 2015-05-09 NOTE — Progress Notes (Signed)
Pre visit review using our clinic review tool, if applicable. No additional management support is needed unless otherwise documented below in the visit note.  Chief Complaint  Patient presents with  . Medicare Wellness    HPI: [name] [name]. 64 y.o. comes in today for Preventive Medicare wellness visit .fir welcome visit  And Chronic disease management DM:  Gained some weight taking care of wife knows heneeds to do better  CV: AF stable  EYE  Drusen  Optic disc .and seen at [facility]   Dx with preglaucoma dr Patrice Paradise .  To fu with vascular for IAa  neurysm.  Sleep apnea rx  Still n [employer] will not pay .  hasn't tried the belsom yet .  Still on ambien.  toill runs out  No neur sx except residual numbness left great toe   Health Maintenance  Topic Date Due  . OPHTHALMOLOGY EXAM  09/22/2014  . HIV Screening  04/29/2016 (Originally 11/09/1966)  . URINE MICROALBUMIN  07/01/2015  . INFLUENZA VACCINE  07/31/2015  . HEMOGLOBIN A1C  11/02/2015  . FOOT EXAM  11/09/2015  . PNA vac Low Risk Adult (2 of 2 - PPSV23) 05/08/2016  . COLONOSCOPY  11/14/2021  . TETANUS/TDAP  04/05/2025  . ZOSTAVAX  Completed   Health Maintenance Review LIFESTYLE:  Exercise:   Machines treadmills  Tobacco/ETS:no Alcohol: no Sugar beverages:  no Sleep:  OSA   Fit bit about 6-7  Drug use: no Colonoscopy:  MEDICARE DOCUMENT QUESTIONS  TO SCAN   Hearing: ok  Beatris Si can get hearing aid   Vision:  No limitations at present . Last eye check   Drusen optic disc  Early cataract   Safety:  Has smoke detector and wears seat belts.  No firearms. No excess sun exposure. Sees dentist regularly.  Falls: tripped over hose  No injury   Advance directive :  Reviewed  Has one .  Memory: Felt to be good  , no concern from her or her family.  Depression: No anhedonia unusual crying or depressive symptoms  Nutrition: Eats well balanced diet; adequate calcium and vitamin D. No swallowing chewing  problems.  Injury: no major injuries in the last six months.  Other healthcare providers:  Reviewed today .  Social:  Lives with spouse married. No pets.  onePassed away feb.   Preventive parameters: up-to-date  Reviewed   ADLS:   There are no problems or need for assistance  driving, feeding, obtaining food, dressing, toileting and bathing, managing money using phone. he is independent.    ROS:  GEN/ HEENT: No fever, significant weight changes sweats headaches vision problems hearing changes, CV/ PULM; No chest pain shortness of breath cough, syncope,edema  change in exercise tolerance. GI /GU: No adominal pain, vomiting, change in bowel habits. No blood in the stool. No significant GU symptoms. SKIN/HEME: ,no acute skin rashes suspicious lesions or bleeding. No lymphadenopathy, nodules, masses.  NEURO/ PSYCH:  No neurologic signs such as weakness numbness. No depression anxiety. IMM/ Allergy: No unusual infections.  Allergy .   REST of 12 system review negative except as per HPI   Past Medical History  Diagnosis Date  . HYPERLIPIDEMIA 03/22/2009  . Obesity, unspecified 04/24/2009  . THROMBOCYTOPENIA 08/16/2010  . ERECTILE DYSFUNCTION 03/22/2009  . SLEEP APNEA, OBSTRUCTIVE 03/22/2009    compliant with CPAP  . 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 Coronary atherosclerosis of native coronary artery 11/2002    a. s/p stent to LAD 12/03; OM2  occluded at cath 12/03; d. myoview 5/10: no ischemia;  e. echo 7/11: EF 55%, BAE, mild RVE, PASP 41-45; Myoview was in March 2013. There was no ischemia or infarction, EF 51%   . Atrial fibrillation 03/22/2009    a. s/p multiple DCCV; b. no coumadin due to low TE risk profile; c. Tikosyn Rx  . GERD 03/22/2009  . LATERAL EPICONDYLITIS, LEFT 10/24/2009  . HYPERGLYCEMIA 04/25/2010  . LIVER FUNCTION TESTS, ABNORMAL 04/25/2010  . TOBACCO USE, QUIT 10/24/2009  . Umbilical hernia   . Myocardial infarction mi2003  . Local reaction to immunization 05/05/2012     minor resolving  zostavax   . Numbness in left leg     foot related to back disease and surgery  . Diabetes mellitus without complication   . Renal cyst     Characterized by MRI as simple  . Iliac aneurysm     2.6 to be evaluated incidental finding on CT  . Ruptured suppurative appendicitis     2015   . Drusen body     see opth note    Family History  Problem Relation Age of Onset  . Thyroid disease Mother   . Ovarian cancer Mother   . Breast cancer Mother   . Lung cancer Father   . Cancer Father     History   Social History  . Marital Status: Married    Spouse Name: N/A  . Number of Children: N/A  . Years of Education: N/A   Social History Main Topics  . Smoking status: Former Smoker -- 2.00 packs/day for 42 years    Types: Cigarettes    Quit date: 12/30/2008  . Smokeless tobacco: Never Used     Comment: started at age 38; 1-2 ppd; quit 2010  . Alcohol Use: No  . Drug Use: No  . Sexual Activity: Yes   Other Topics Concern  . None   Social History Narrative   Retired paramedic   Regular exercise-yes not recently    Has children   Wife is overweight and has fibromyalgia and depression on disability doesn't go out much. Back surgery    Has older dog   Retired from [employer] years of service now 7 years     Outpatient Encounter Prescriptions as of 05/09/2015  Medication Sig  . atorvastatin (LIPITOR) 80 MG tablet Take 1 tablet by mouth  daily  . Blood Glucose Monitoring Suppl (ONETOUCH VERIO IQ SYSTEM) W/DEVICE KIT   . clotrimazole (LOTRIMIN) 1 % cream Apply 1 application topically daily as needed.  . dabigatran (PRADAXA) 150 MG CAPS capsule TAKE 1 CAPSULE BY MOUTH EVERY 12 HOURS  . dofetilide (TIKOSYN) 500 MCG capsule Take 1 capsule (500 mcg total) by mouth 2 (two) times daily.  . famotidine (PEPCID) 20 MG tablet Take 10-20 mg by mouth daily as needed for heartburn or indigestion.   . fish oil-omega-3 fatty acids 1000 MG capsule Take 1 g by mouth 2 (two)  times daily.   [name] MAGNESIUM OXIDE PO Take 750 mg by mouth every evening.   . metFORMIN (GLUCOPHAGE-XR) 500 MG 24 hr tablet Take 1 tablet (500 mg total) by mouth 3 (three) times daily.  . MULTIPLE VITAMIN PO Take 1 tablet by mouth every evening.   . nitroGLYCERIN (NITROSTAT) 0.4 MG SL tablet Place 1 tablet (0.4 mg total) under the tongue every 5 (five) minutes as needed.  [name] DELICA LANCETS 61Y MISC USE AS DIRECTED  . ONETOUCH VERIO test strip   .  potassium chloride SA (K-DUR,KLOR-CON) 20 MEQ tablet Take 1 tablet (20 mEq total) by mouth daily.  [name] zolpidem (AMBIEN) 10 MG tablet Take 1 tablet (10 mg total) by mouth at bedtime as needed for sleep.  . [DISCONTINUED] metFORMIN (GLUCOPHAGE-XR) 500 MG 24 hr tablet Take 1 tablet (500 mg total) by mouth 2 (two) times daily.  . pantoprazole (PROTONIX) 40 MG tablet TAKE 1 TABLET (40 MG TOTAL) BY MOUTH DAILY. (Patient not taking: Reported on 05/09/2015)  . Suvorexant (BELSOMRA) 10 MG TABS Take 10 mg by mouth at bedtime as needed. (Patient not taking: Reported on 05/09/2015)   No facility-administered encounter medications on file as of 05/09/2015.    EXAM:  BP 130/70 mmHg  Temp(Src) 98.4 F (36.9 C) (Oral)  Ht 5' 11.5" (1.816 m)  Wt 331 lb 12.8 oz (150.503 kg)  BMI 45.64 kg/m2  Body mass index is 45.64 kg/(m^2).  Physical Exam: Vital signs reviewed JEH:UDJS is a well-developed well-nourished alert cooperative   who appears stated age in no acute distress.  HEENT: normocephalic atraumatic , Eyes: PERRL EOM's full, conjunctiva clear, glasses Nares: paten,t no deformity discharge or tenderness., Ears: no deformity EAC's clear TMs with normal landmarks. Mouth: clear OP, no lesions, edema.  Moist mucous membranes. Dentition in adequate repair. NECK: supple without masses, thyromegaly or bruits. CHEST/PULM:  Clear to auscultation and percussion breath sounds equal no wheeze , rales or rhonchi. No chest wall deformities or tenderness. CV: PMI is  nondisplaced, S1 S2 no gallops, murmurs, rubs. Peripheral pulses are full without delay.No JVD .  ABDOMEN: Bowel sounds normal nontender  No guard or rebound, no hepato splenomegal no CVA tenderness.  No hernia. Extremtities:  No clubbing cyanosis or edema, no acute joint swelling or redness no focal atrophy VV noted no lesion NEURO:  Oriented x3, cranial nerves 3-12 appear to be intact, no obvious focal weakness,gait within normal limits no abnormal reflexes or asymmetricaldec sens left great toe Diabetic Foot Exam - Simple   Simple Foot Form  Diabetic Foot exam was performed with the following findings:  Yes 05/09/2015 11:25 AM  Visual Inspection  No deformities, no ulcerations, no other skin breakdown bilaterally:  Yes  Sensation Testing  Intact to touch and monofilament testing bilaterally:  Yes  See comments:  Yes  Pulse Check  Posterior Tibialis and Dorsalis pulse intact bilaterally:  Yes  Comments  Dec sense left great toe no lesion      SKIN: No acute rashes normal turgor, color, no bruising or petechiae. PSYCH: Oriented, good eye contact, no obvious depression anxiety, cognition and judgment appear normal. LN: no cervical axillary inguinal adenopathy No noted deficits in memory, attention, and speech.   Lab Results  Component Value Date   WBC 8.1 05/02/2015   HGB 16.9 05/02/2015   HCT 48.9 05/02/2015   PLT 135.0* 05/02/2015   GLUCOSE 133* 05/02/2015   CHOL 107 06/30/2014   TRIG 133.0 06/30/2014   HDL 32.70* 06/30/2014   LDLCALC 48 06/30/2014   ALT 44 06/30/2014   AST 34 06/30/2014   NA 137 05/02/2015   K 4.2 05/02/2015   CL 103 05/02/2015   CREATININE 0.90 05/02/2015   BUN 19 05/02/2015   CO2 27 05/02/2015   TSH 1.96 06/30/2014   PSA 1.04 06/30/2014   INR 1.27 06/05/2014   HGBA1C 7.2* 05/02/2015   MICROALBUR 2.9* 06/30/2014   Wt Readings from Last 3 Encounters:  05/09/15 331 lb 12.8 oz (150.503 kg)  01/23/15 329 lb 12.8 oz (149.596  kg)  11/17/14 317 lb  14.4 oz (144.198 kg)   BP Readings from Last 3 Encounters:  05/09/15 130/70  01/23/15 124/72  11/17/14 120/80    ASSESSMENT AND PLAN:  Discussed the following assessment and plan:  Welcome to Medicare preventive visit  Visit for preventive health examination  Type 2 diabetes mellitus with other circulatory complications - increase metformin to 1500 per day and fu   Morbid obesity - sig weight gain after loss from sugery neesd sto [place] weight disc today  Chronic anticoagulation  Hyperlipidemia  Sleep disorder - plan transitino to belsoma  Need for vaccination with 13-polyvalent pneumococcal conjugate vaccine - Plan: Pneumococcal conjugate vaccine 13-valent dsic shared decision making can get psa next labs   With lipids and a1c ? psa next year  Patient Care Team: Burnis Medin, MD as PCP - General Juanita Craver, MD (Gastroenterology) Kathee Delton, MD (Pulmonary Disease) [facility] Grant Fontana., MD (Urology) Nira Retort, MD (Hematology and Oncology) Lelon Perla, MD (Cardiology) Hurman Horn, MD (Ophthalmology) Jackolyn Confer, MD as Consulting Physician (General Surgery) Georgeann Oppenheim, MD as Consulting Physician (Ophthalmology)  Patient Instructions   Diabetes control is worsening  Losing weight  Will help  increase metformin  To   3 500 mg per day   Intensify lifestyle interventions. As discussed . Can do psa  At next blood tests     Standley Brooking. Panosh M.D.

## 2015-05-09 NOTE — Patient Instructions (Addendum)
  Diabetes control is worsening  Losing weight  Will help  increase metformin  To   3 500 mg per day   Intensify lifestyle interventions. As discussed . Can do psa  At next blood tests

## 2015-05-11 ENCOUNTER — Encounter: Payer: Self-pay | Admitting: Internal Medicine

## 2015-05-16 ENCOUNTER — Encounter: Payer: Self-pay | Admitting: Internal Medicine

## 2015-05-22 ENCOUNTER — Encounter: Payer: Self-pay | Admitting: Family Medicine

## 2015-05-26 ENCOUNTER — Other Ambulatory Visit: Payer: Self-pay | Admitting: Internal Medicine

## 2015-05-26 NOTE — Telephone Encounter (Signed)
Sent to the pharmacy by e-scribe. 

## 2015-06-09 ENCOUNTER — Other Ambulatory Visit: Payer: Self-pay | Admitting: Internal Medicine

## 2015-06-09 NOTE — Telephone Encounter (Signed)
Sent to the pharmacy by e-scribe. 

## 2015-08-03 ENCOUNTER — Other Ambulatory Visit: Payer: Self-pay | Admitting: Vascular Surgery

## 2015-08-03 LAB — BUN: BUN: 18 mg/dL (ref 7–25)

## 2015-08-03 LAB — CREATININE, SERUM: Creat: 0.86 mg/dL (ref 0.70–1.25)

## 2015-08-08 ENCOUNTER — Telehealth: Payer: Self-pay | Admitting: Cardiology

## 2015-08-08 ENCOUNTER — Encounter (HOSPITAL_COMMUNITY): Payer: Self-pay

## 2015-08-08 ENCOUNTER — Emergency Department (HOSPITAL_COMMUNITY): Payer: Medicare Other

## 2015-08-08 ENCOUNTER — Emergency Department (HOSPITAL_COMMUNITY)
Admission: EM | Admit: 2015-08-08 | Discharge: 2015-08-08 | Disposition: A | Discharge status: Home / Self Care | Payer: Medicare Other | Attending: Emergency Medicine | Admitting: Emergency Medicine

## 2015-08-08 ENCOUNTER — Encounter: Payer: Self-pay | Admitting: Vascular Surgery

## 2015-08-08 DIAGNOSIS — E669 Obesity, unspecified: Secondary | ICD-10-CM | POA: Insufficient documentation

## 2015-08-08 DIAGNOSIS — Z880 Allergy status to penicillin: Secondary | ICD-10-CM | POA: Diagnosis not present

## 2015-08-08 DIAGNOSIS — I4891 Unspecified atrial fibrillation: Secondary | ICD-10-CM | POA: Insufficient documentation

## 2015-08-08 DIAGNOSIS — M7710 Lateral epicondylitis, unspecified elbow: Secondary | ICD-10-CM | POA: Diagnosis present

## 2015-08-08 DIAGNOSIS — G4733 Obstructive sleep apnea (adult) (pediatric): Secondary | ICD-10-CM | POA: Diagnosis not present

## 2015-08-08 DIAGNOSIS — Z862 Personal history of diseases of the blood and blood-forming organs and certain disorders involving the immune mechanism: Secondary | ICD-10-CM | POA: Diagnosis not present

## 2015-08-08 DIAGNOSIS — Z79899 Other long term (current) drug therapy: Secondary | ICD-10-CM | POA: Diagnosis not present

## 2015-08-08 DIAGNOSIS — Z87448 Personal history of other diseases of urinary system: Secondary | ICD-10-CM | POA: Insufficient documentation

## 2015-08-08 DIAGNOSIS — K219 Gastro-esophageal reflux disease without esophagitis: Secondary | ICD-10-CM | POA: Insufficient documentation

## 2015-08-08 DIAGNOSIS — E785 Hyperlipidemia, unspecified: Secondary | ICD-10-CM | POA: Diagnosis not present

## 2015-08-08 DIAGNOSIS — Z87891 Personal history of nicotine dependence: Secondary | ICD-10-CM | POA: Insufficient documentation

## 2015-08-08 DIAGNOSIS — Z8739 Personal history of other diseases of the musculoskeletal system and connective tissue: Secondary | ICD-10-CM | POA: Insufficient documentation

## 2015-08-08 DIAGNOSIS — E119 Type 2 diabetes mellitus without complications: Secondary | ICD-10-CM | POA: Diagnosis not present

## 2015-08-08 DIAGNOSIS — Q6100 Congenital renal cyst, unspecified: Secondary | ICD-10-CM | POA: Insufficient documentation

## 2015-08-08 DIAGNOSIS — I2510 Atherosclerotic heart disease of native coronary artery without angina pectoris: Secondary | ICD-10-CM | POA: Diagnosis not present

## 2015-08-08 DIAGNOSIS — E1159 Type 2 diabetes mellitus with other circulatory complications: Secondary | ICD-10-CM | POA: Diagnosis present

## 2015-08-08 DIAGNOSIS — I252 Old myocardial infarction: Secondary | ICD-10-CM | POA: Diagnosis not present

## 2015-08-08 DIAGNOSIS — Z7901 Long term (current) use of anticoagulants: Secondary | ICD-10-CM

## 2015-08-08 LAB — BASIC METABOLIC PANEL
Anion gap: 8 (ref 5–15)
BUN: 15 mg/dL (ref 6–20)
CO2: 26 mmol/L (ref 22–32)
Calcium: 9.6 mg/dL (ref 8.9–10.3)
Chloride: 103 mmol/L (ref 101–111)
Creatinine, Ser: 0.86 mg/dL (ref 0.61–1.24)
GFR calc Af Amer: >60 mL/min (ref >60)
GFR calc non Af Amer: >60 mL/min (ref >60)
Glucose, Bld: 187 mg/dL — ABNORMAL HIGH (ref 65–99)
Potassium: 4.3 mmol/L (ref 3.5–5.1)
Sodium: 137 mmol/L (ref 135–145)

## 2015-08-08 LAB — CBC
HCT: 47.9 % (ref 39.0–52.0)
Hemoglobin: 17.1 g/dL — ABNORMAL HIGH (ref 13.0–17.0)
MCH: 32.5 pg (ref 26.0–34.0)
MCHC: 35.7 g/dL (ref 30.0–36.0)
MCV: 91.1 fL (ref 78.0–100.0)
Platelets: 183 10*3/uL (ref 150–400)
RBC: 5.26 MIL/uL (ref 4.22–5.81)
RDW: 13.1 % (ref 11.5–15.5)
WBC: 9.1 10*3/uL (ref 4.0–10.5)

## 2015-08-08 LAB — I-STAT TROPONIN, ED: Troponin i, poc: 0 ng/mL (ref 0.00–0.08)

## 2015-08-08 MED ORDER — DILTIAZEM HCL 25 MG/5ML IV SOLN
10.0000 mg | Freq: Once | INTRAVENOUS | Status: AC
  Administered 2015-08-08: 10 mg via INTRAVENOUS

## 2015-08-08 MED ORDER — ETOMIDATE 2 MG/ML IV SOLN
16.0000 mg | Freq: Once | INTRAVENOUS | Status: AC
  Administered 2015-08-08: 16 mg via INTRAVENOUS
  Filled 2015-08-08: qty 10

## 2015-08-08 MED ORDER — DILTIAZEM HCL 100 MG IV SOLR
5.0000 mg/h | INTRAVENOUS | Status: DC
  Administered 2015-08-08: 5 mg/h via INTRAVENOUS
  Filled 2015-08-08: qty 100

## 2015-08-08 MED ORDER — METOPROLOL TARTRATE 25 MG PO TABS
25.0000 mg | ORAL_TABLET | Freq: Once | ORAL | Status: AC
  Administered 2015-08-08: 25 mg via ORAL
  Filled 2015-08-08: qty 1

## 2015-08-08 MED ORDER — METHYLPREDNISOLONE SODIUM SUCC 125 MG IJ SOLR
125.0000 mg | Freq: Once | INTRAMUSCULAR | Status: AC
Start: 2015-08-08 — End: 2015-08-08
  Administered 2015-08-08: 125 mg via INTRAVENOUS
  Filled 2015-08-08: qty 2

## 2015-08-08 MED ORDER — DIPHENHYDRAMINE HCL 50 MG/ML IJ SOLN
50.0000 mg | Freq: Once | INTRAMUSCULAR | Status: AC
  Administered 2015-08-08: 50 mg via INTRAVENOUS
  Filled 2015-08-08: qty 1

## 2015-08-08 NOTE — ED Notes (Signed)
Dr Zammit at bedside. 

## 2015-08-08 NOTE — Procedures (Signed)
Patient: [name] [name]. MRN: [id number] Age: 64 y.o. Sex: male  Procedure: DC Cardioversion  H&P Status: H&P was reviewed, the patient was examined and no change has occurred in patient's condition since consult was completed.   Indication: Atrial Fibrillation with RVR  Cardiologist: Philbert Riser  Medications: Etomidate 16 mg (ordered by EDP)  Findings/Results:  1. Successful DCCV: 200 Joules x 1 attempt 2. Resulting rhythm: sinus 57 bpm with PACs and PVC, QTc 573 ms  Complications: None   A/P: See Orders  Continue home Tikosyn and Pradaxa.  Follow up in clinic with Dr. Stanford Breed. Okay to discharge from cardiology perspective once post-sedation monitoring complete.

## 2015-08-08 NOTE — ED Notes (Signed)
Pt given OK to take home PO night time medications by Roderic Palau, MD

## 2015-08-08 NOTE — ED Notes (Signed)
Hannah, PA at bedside  

## 2015-08-08 NOTE — Discharge Instructions (Signed)
Follow-up with your cardiologist next week

## 2015-08-08 NOTE — Telephone Encounter (Signed)
Spoke with pt, he has been under stress with his mother-n-law passing away. He noticed a PVC and now his rhythm is irregular. He is due to take his tikosyn in about 2 hours. He reports his rate is above 100 and he feels bad. He states he feels he needs to get out of this rhythm.  he was referred to the ER for evaluation.

## 2015-08-08 NOTE — ED Notes (Signed)
Pt OK'd to eat by Roderic Palau, MD and Philbert Riser, MD

## 2015-08-08 NOTE — ED Notes (Signed)
resp therapy notified of procedural sedation

## 2015-08-08 NOTE — ED Notes (Addendum)
Pt reports hives and itching on arms and chest; cardizem drip stopped; [place], PA and Roderic Palau, MD made aware; VSS

## 2015-08-08 NOTE — ED Notes (Signed)
Crash cart, airway cart at bedside for bedside cardioversion; Cardiology and ED MD at bedside

## 2015-08-08 NOTE — ED Notes (Signed)
[name] Riser, MD Cardiology at bedside.

## 2015-08-08 NOTE — Telephone Encounter (Signed)
Pt called in stating that he just went into Afib and would like to know what to do. Please advise  Thanks

## 2015-08-08 NOTE — ED Notes (Signed)
Onset 4:57pm pt and wife were laying down talking and crying (mother in law died yesterday) pt started feeling anxious, dizzy, lightheaded and felt self go into Afib.  No chest pain, shortness of breath.  No other s/s noted.

## 2015-08-08 NOTE — ED Provider Notes (Signed)
CSN: [id number]     Arrival date & time 08/08/15  1742 History   First MD Initiated Contact with Patient 08/08/15 1859     Chief Complaint  Patient presents with  . Atrial Fibrillation     (Consider location/radiation/quality/duration/timing/severity/associated sxs/prior Treatment) Patient is a 64 y.o. male presenting with atrial fibrillation. The history is provided by the patient (the pt states he is in afib again.  he has been cardioverted 9 times.  last time 2 years ago).  Atrial Fibrillation This is a recurrent problem. The current episode started 3 to 5 hours ago. The problem occurs constantly. The problem has not changed since onset.Pertinent negatives include no chest pain, no abdominal pain and no headaches. Nothing aggravates the symptoms. Nothing relieves the symptoms.    Past Medical History  Diagnosis Date  . HYPERLIPIDEMIA 03/22/2009  . Obesity, unspecified 04/24/2009  . THROMBOCYTOPENIA 08/16/2010  . ERECTILE DYSFUNCTION 03/22/2009  . SLEEP APNEA, OBSTRUCTIVE 03/22/2009    compliant with CPAP  . 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 Coronary atherosclerosis of native coronary artery 11/2002    a. s/p stent to LAD 12/03; OM2 occluded at cath 12/03; d. myoview 5/10: no ischemia;  e. echo 7/11: EF 55%, BAE, mild RVE, PASP 41-45; Myoview was in March 2013. There was no ischemia or infarction, EF 51%   . Atrial fibrillation 03/22/2009    a. s/p multiple DCCV; b. no coumadin due to low TE risk profile; c. Tikosyn Rx  . GERD 03/22/2009  . LATERAL EPICONDYLITIS, LEFT 10/24/2009  . HYPERGLYCEMIA 04/25/2010  . LIVER FUNCTION TESTS, ABNORMAL 04/25/2010  . TOBACCO USE, QUIT 10/24/2009  . Umbilical hernia   . Myocardial infarction mi2003  . Local reaction to immunization 05/05/2012    minor resolving  zostavax   . Numbness in left leg     foot related to back disease and surgery  . Diabetes mellitus without complication   . Renal cyst     Characterized by MRI as simple  . Iliac aneurysm      2.6 to be evaluated incidental finding on CT  . Ruptured suppurative appendicitis     2015   . Drusen body     see opth note   Past Surgical History  Procedure Laterality Date  . Cyst on epiglottis  08/2002  . Surgery l4-l5   1998    ruptured x 3  . Stent      LAD DUS 2004  . Ulnar neuropathy    . Umbilical hernia repair      mesh  . Lumbar disc surgery      two  holes in spinalcovering  . Esophagogastroduodenoscopy  11/15/2011    Procedure: ESOPHAGOGASTRODUODENOSCOPY (EGD);  Surgeon: Juanita Craver, MD;  Location: [facility] ENDOSCOPY;  Service: Endoscopy;  Laterality: N/A;  . Colonoscopy  11/15/2011    Procedure: COLONOSCOPY;  Surgeon: Juanita Craver, MD;  Location: [facility] END[facility]COPY;  Service: Endoscopy;  Laterality: N/A;  . Laparoscopic appendectomy N/A 06/06/2014    Procedure: APPENDECTOMY LAPAROSCOPIC attemted;  Surgeon: Odis Hollingshead, MD;  Location: [facility] ORS;  Service: General;  Laterality: N/A;  . Appendectomy N/A 06/06/2014    Procedure: APPENDECTOMY;  Surgeon: Odis Hollingshead, MD;  Location: [facility] ORS;  Service: General;  Laterality: N/A;  . Colon surgery     Family History  Problem Relation Age of Onset  . Thyroid disease Mother   . Ovarian cancer Mother   . Breast cancer Mother   . Lung cancer Father   .  Cancer Father    History  Substance Use Topics  . Smoking status: Former Smoker -- 2.00 packs/day for 42 years    Types: Cigarettes    Quit date: 12/30/2008  . Smokeless tobacco: Never Used     Comment: started at age 68; 1-2 ppd; quit 2010  . Alcohol Use: No    Review of Systems  Constitutional: Negative for appetite change and fatigue.  HENT: Negative for congestion, ear discharge and sinus pressure.   Eyes: Negative for discharge.  Respiratory: Negative for cough.   Cardiovascular: Positive for palpitations. Negative for chest pain.  Gastrointestinal: Negative for abdominal pain and diarrhea.  Genitourinary: Negative for frequency and hematuria.  Musculoskeletal:  Negative for back pain.  Skin: Negative for rash.  Neurological: Negative for seizures and headaches.  Psychiatric/Behavioral: Negative for hallucinations.      Allergies  Penicillins; Procaine hcl; Pseudoephedrine; and Sulfonamide derivatives  Home Medications   Prior to Admission medications   Medication Sig Start Date End Date Taking? Authorizing Provider  atorvastatin (LIPITOR) 80 MG tablet Take 1 tablet by mouth  daily 05/26/15  Yes Burnis Medin, MD  dabigatran (PRADAXA) 150 MG CAPS capsule TAKE 1 CAPSULE BY MOUTH EVERY 12 HOURS 11/08/14  Yes Burnis Medin, MD  dofetilide (TIKOSYN) 500 MCG capsule Take 1 capsule (500 mcg total) by mouth 2 (two) times daily. 02/22/15  Yes Lelon Perla, MD  famotidine (PEPCID) 20 MG tablet Take 10-20 mg by mouth daily as needed for heartburn or indigestion.    Yes Historical Provider, MD  fish oil-omega-3 fatty acids 1000 MG capsule Take 1 g by mouth 2 (two) times daily.    Yes Historical Provider, MD  MAGNESIUM OXIDE PO Take 750 mg by mouth every evening.    Yes Historical Provider, MD  metFORMIN (GLUCOPHAGE-XR) 500 MG 24 hr tablet Take 1 tablet (500 mg total) by mouth 3 (three) times daily. 05/09/15  Yes Burnis Medin, MD  MULTIPLE VITAMIN PO Take 1 tablet by mouth every evening.    Yes Historical Provider, MD  nitroGLYCERIN (NITROSTAT) 0.4 MG SL tablet Place 1 tablet (0.4 mg total) under the tongue every 5 (five) minutes as needed. 05/20/14 01/28/17 Yes Scott T Weaver, PA-C  pantoprazole (PROTONIX) 40 MG tablet TAKE 1 TABLET (40 MG TOTAL) BY MOUTH DAILY. 04/17/15  Yes Burnis Medin, MD  potassium chloride SA (K-DUR,KLOR-CON) 20 MEQ tablet Take 1 tablet (20 mEq total) by mouth daily. 01/27/15  Yes Lelon Perla, MD  zolpidem (AMBIEN) 10 MG tablet Take 1 tablet (10 mg total) by mouth at bedtime as needed for sleep. 11/08/14  Yes Burnis Medin, MD  Blood Glucose Monitoring Suppl (ONETOUCH VERIO IQ SYSTEM) W/DEVICE KIT  03/22/14   Historical  Provider, MD  Jonetta Speak LANCETS 42A MISC USE AS DIRECTED 03/14/15   Burnis Medin, MD  [facility] VERIO test strip USE TWICE A DAY 06/09/15   Burnis Medin, MD  Suvorexant (BELSOMRA) 10 MG TABS Take 10 mg by mouth at bedtime as needed. Patient not taking: Reported on 05/09/2015 02/07/15   Burnis Medin, MD   BP 109/77 mmHg  Pulse 52  Resp 14  SpO2 93% Physical Exam  Constitutional: He is oriented to person, place, and time. He appears well-developed.  HENT:  Head: Normocephalic.  Eyes: Conjunctivae and EOM are normal. No scleral icterus.  Neck: Neck supple. No thyromegaly present.  Cardiovascular: Exam reveals no gallop and no friction rub.   No murmur  heard. Irregular fast heart rate  Pulmonary/Chest: No stridor. He has no wheezes. He has no rales. He exhibits no tenderness.  Abdominal: He exhibits no distension. There is no tenderness. There is no rebound.  Musculoskeletal: Normal range of motion. He exhibits no edema.  Lymphadenopathy:    He has no cervical adenopathy.  Neurological: He is oriented to person, place, and time. He exhibits normal muscle tone. Coordination normal.  Skin: No rash noted. No erythema.  Psychiatric: He has a normal mood and affect. His behavior is normal.    ED Course  Procedures (including critical care time) Labs Review Labs Reviewed  BASIC METABOLIC PANEL - Abnormal; Notable for the following:    Glucose, Bld 187 (*)    All other components within normal limits  CBC - Abnormal; Notable for the following:    Hemoglobin 17.1 (*)    All other components within normal limits  I-STAT TROPOININ, ED    Imaging Review Dg Chest 2 View  08/08/2015   CLINICAL DATA:  Atrial fibrillation today with dizziness and shortness of breath.  EXAM: CHEST  2 VIEW  COMPARISON:  03/19/2012  FINDINGS: The heart is mildly enlarged but stable. There is tortuosity and mild ectasia and calcification of the thoracic aorta. Chronic bronchitic type interstitial lung  changes possibly related to smoking. No acute infiltrates, edema or effusions. The bony thorax is intact.  IMPRESSION: Chronic bronchitic type interstitial lung changes but no definite acute overlying pulmonary process.   Electronically Signed   By: Marijo Sanes M.D.   On: 08/08/2015 19:03     EKG Interpretation   Date/Time:  Tuesday August 08 2015 17:50:25 EDT Ventricular Rate:  114 PR Interval:    QRS Duration: 98 QT Interval:  350 QTC Calculation: 482 R Axis:   116 Text Interpretation:  Atrial fibrillation with rapid ventricular response  Right axis deviation Incomplete right bundle branch block Nonspecific ST  abnormality Abnormal ECG Confirmed by Lejon Afzal  MD, Dalphine Cowie [phone]) on  08/08/2015 7:00:03 PM     CRITICAL CARE Performed by: Joal Eakle L Total critical care time: 35 Critical care time was exclusive of separately billable procedures and treating other patients. Critical care was necessary to treat or prevent imminent or life-threatening deterioration. Critical care was time spent personally by me on the following activities: development of treatment plan with patient and/or surrogate as well as nursing, discussions with consultants, evaluation of patient's response to treatment, examination of patient, obtaining history from patient or surrogate, ordering and performing treatments and interventions, ordering and review of laboratory studies, ordering and review of radiographic studies, pulse oximetry and re-evaluation of patient's condition.  MDM   Final diagnoses:  Atrial fibrillation with RVR    Pt seen by cardiology and the pt wanted to be cardioverted in the ed.   The pt was given 86m of etominate and then was cardioverted with 200 joules.  The pt tolerated the procedure well.   The pt was initially given cardiazem to control his rate, but he had an allergic reaction to the cardiazem.    JMilton Ferguson MD 08/08/15 2245

## 2015-08-08 NOTE — Consult Note (Signed)
Referring Physician: Dr. Roderic Palau Primary Physician: Primary Cardiologist: Dr. Stanford Breed Reason for Consultation: AF RVR   HPI: [name] [name] is a 64 yo man with h/o pAF s/p multiple DCCVs on Tikosyn and Pradaxa who presents to ED today for AF.  He reports onset was today.  On arrival to ED HR 120s, 114 on ECG.  He was given diltiazem by ED and developed rash and itching with concerns for allergic reaction and given SoluMedrol and Benadryl.  Cardiology consulted for further assistance in management.   He tells me that he felt he went out of rhythm at 5 pm while talking to his wife.  He called the cardiology office and was instructed to come in.  He prefers to be discharged and states and "I came to be cardioverted".  His rate is overall controlled ranging 80s-90s with occasional spikes to 130s.  However, despite this he remains symptomatic with palpitations and states he feels worse the longer he remains in it.     Review of Systems:     Cardiac Review of Systems: {Y] = yes [ ]  = no  Chest Pain [    ]  Resting SOB [   ] Exertional SOB  [  ]  Orthopnea [  ]   Pedal Edema [ x  ]    Palpitations [x  ] Syncope  [  ]   Presyncope [   ]  General Review of Systems: [Y] = yes [  ]=no Constitional: recent weight change [  ]; anorexia [  ]; fatigue [  ]; nausea [  ]; night sweats [  ]; fever [  ]; or chills [  ];                                                                      Eyes : blurred vision [  ]; diplopia [   ]; vision changes [  ];  Amaurosis fugax[  ]; Resp: cough [  ];  wheezing[  ];  hemoptysis[  ];  PND [  ];  GI:  gallstones[  ], vomiting[  ];  dysphagia[  ]; melena[  ];  hematochezia [  ]; heartburn[  ];   GU: kidney stones [  ]; hematuria[  ];   dysuria [  ];  nocturia[  ]; incontinence [  ];             Skin: rash, swelling[  ];, hair loss[  ];  peripheral edema[  ];  or itching[  ]; Musculosketetal: myalgias[  ];  joint swelling[  ];  joint erythema[  ];  joint pain[  ];  back  pain[  ];  Heme/Lymph: bruising[  ];  bleeding[  ];  anemia[  ];  Neuro: TIA[  ];  headaches[  ];  stroke[  ];  vertigo[  ];  seizures[  ];   paresthesias[  ];  difficulty walking[  ];  Psych:depression[  ]; anxiety[  ];  Endocrine: diabetes[  ];  thyroid dysfunction[  ];  Other:  Past Medical History  Diagnosis Date  . HYPERLIPIDEMIA 03/22/2009  . Obesity, unspecified 04/24/2009  . THROMBOCYTOPENIA 08/16/2010  . ERECTILE DYSFUNCTION 03/22/2009  . SLEEP APNEA, OBSTRUCTIVE 03/22/2009  compliant with CPAP  . 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 Coronary atherosclerosis of native coronary artery 11/2002    a. s/p stent to LAD 12/03; OM2 occluded at cath 12/03; d. myoview 5/10: no ischemia;  e. echo 7/11: EF 55%, BAE, mild RVE, PASP 41-45; Myoview was in March 2013. There was no ischemia or infarction, EF 51%   . Atrial fibrillation 03/22/2009    a. s/p multiple DCCV; b. no coumadin due to low TE risk profile; c. Tikosyn Rx  . GERD 03/22/2009  . LATERAL EPICONDYLITIS, LEFT 10/24/2009  . HYPERGLYCEMIA 04/25/2010  . LIVER FUNCTION TESTS, ABNORMAL 04/25/2010  . TOBACCO USE, QUIT 10/24/2009  . Umbilical hernia   . Myocardial infarction mi2003  . Local reaction to immunization 05/05/2012    minor resolving  zostavax   . Numbness in left leg     foot related to back disease and surgery  . Diabetes mellitus without complication   . Renal cyst     Characterized by MRI as simple  . Iliac aneurysm     2.6 to be evaluated incidental finding on CT  . Ruptured suppurative appendicitis     2015   . Drusen body     see opth note     (Not in a hospital admission)      Infusions: . diltiazem (CARDIZEM) infusion Stopped (08/08/15 2030)    Allergies  Allergen Reactions  . Penicillins     Childhood reaction   . Procaine Hcl Hives and Other (See Comments)    Red in the face and neck   . Pseudoephedrine Other (See Comments)    Patient went into afib  . Sulfonamide Derivatives     Childhood  reaction     History   Social History  . Marital Status: Married    Spouse Name: N/A  . Number of Children: N/A  . Years of Education: N/A   Occupational History  . Not on file.   Social History Main Topics  . Smoking status: Former Smoker -- 2.00 packs/day for 42 years    Types: Cigarettes    Quit date: 12/30/2008  . Smokeless tobacco: Never Used     Comment: started at age 61; 1-2 ppd; quit 2010  . Alcohol Use: No  . Drug Use: No  . Sexual Activity: Yes   Other Topics Concern  . Not on file   Social History Narrative   Retired paramedic   Regular exercise-yes not recently    Has children   Wife is overweight and has fibromyalgia and depression on disability doesn't go out much. Back surgery    Has older dog   Retired from [employer] 31 years of service now 7 years     Family History  Problem Relation Age of Onset  . Thyroid disease Mother   . Ovarian cancer Mother   . Breast cancer Mother   . Lung cancer Father   . Cancer Father     PHYSICAL EXAM: Filed Vitals:   08/08/15 2015  BP: 110/76  Pulse: 98  Resp: 12    No intake or output data in the 24 hours ending 08/08/15 2043  General:  Overweight.  Well appearing. No respiratory difficulty HEENT: normal Neck: supple. no JVD. Carotids 2+ bilat; no bruits. No lymphadenopathy or thryomegaly appreciated. Cor: PMI nondisplaced. Irregular, tachy. No rubs, gallops or murmurs. Lungs: CTA Abdomen: obese, soft, nontender, nondistended. No hepatosplenomegaly. No bruits or masses. Good bowel sounds. Extremities: no cyanosis, clubbing, mild rash right  upper arm, trace pittingedema Neuro: alert & oriented x 3, cranial nerves grossly intact. moves all 4 extremities w/o difficulty. Affect pleasant.  ECG: AF rvr  Results for orders placed or performed during the hospital encounter of 08/08/15 (from the past 24 hour(s))  Basic metabolic panel     Status: Abnormal   Collection Time: 08/08/15  6:01 PM  Result Value Ref  Range   Sodium 137 135 - 145 mmol/L   Potassium 4.3 3.5 - 5.1 mmol/L   Chloride 103 101 - 111 mmol/L   CO2 26 22 - 32 mmol/L   Glucose, Bld 187 (H) 65 - 99 mg/dL   BUN 15 6 - 20 mg/dL   Creatinine, Ser 0.86 0.61 - 1.24 mg/dL   Calcium 9.6 8.9 - 10.3 mg/dL   GFR calc non Af Amer >60 >60 mL/min   GFR calc Af Amer >60 >60 mL/min   Anion gap 8 5 - 15  CBC     Status: Abnormal   Collection Time: 08/08/15  6:01 PM  Result Value Ref Range   WBC 9.1 4.0 - 10.5 K/uL   RBC 5.26 4.22 - 5.81 MIL/uL   Hemoglobin 17.1 (H) 13.0 - 17.0 g/dL   HCT 47.9 39.0 - 52.0 %   MCV 91.1 78.0 - 100.0 fL   MCH 32.5 26.0 - 34.0 pg   MCHC 35.7 30.0 - 36.0 g/dL   RDW 13.1 11.5 - 15.5 %   Platelets 183 150 - 400 K/uL  I-stat troponin, ED     Status: None   Collection Time: 08/08/15  7:22 PM  Result Value Ref Range   Troponin i, poc 0.00 0.00 - 0.08 ng/mL   Comment 3           Dg Chest 2 View  08/08/2015   CLINICAL DATA:  Atrial fibrillation today with dizziness and shortness of breath.  EXAM: CHEST  2 VIEW  COMPARISON:  03/19/2012  FINDINGS: The heart is mildly enlarged but stable. There is tortuosity and mild ectasia and calcification of the thoracic aorta. Chronic bronchitic type interstitial lung changes possibly related to smoking. No acute infiltrates, edema or effusions. The bony thorax is intact.  IMPRESSION: Chronic bronchitic type interstitial lung changes but no definite acute overlying pulmonary process.   Electronically Signed   By: Marijo Sanes M.D.   On: 08/08/2015 19:03     ASSESSMENT: 64 yo man with pAF on Tikosyn and pradaxa presenting with recurrence of symptomatic AF that began at 5 pm today and possible allergic reaction per EDP after receiving diltiazem treated with SoluMedrol and benadryl.  PLAN/DISCUSSION: DCCV Continue Tikosyn and Pradaxa Avoid CCB given interaction with Tikosyn and possibility of QT prolongation Plan on discharge if successful SR restored once monitoring  post-sedation complete and okay with EDP from allergic reaction perspective.

## 2015-08-08 NOTE — ED Notes (Addendum)
Patient transported to X-ray Prior to room arrival.

## 2015-08-09 ENCOUNTER — Ambulatory Visit
Admission: RE | Admit: 2015-08-09 | Discharge: 2015-08-09 | Disposition: A | Discharge status: Home / Self Care | Payer: Medicare Other | Source: Ambulatory Visit | Attending: Vascular Surgery | Admitting: Vascular Surgery

## 2015-08-09 ENCOUNTER — Encounter: Payer: Self-pay | Admitting: Vascular Surgery

## 2015-08-09 ENCOUNTER — Ambulatory Visit (INDEPENDENT_AMBULATORY_CARE_PROVIDER_SITE_OTHER): Payer: Medicare Other | Admitting: Vascular Surgery

## 2015-08-09 VITALS — BP 112/64 | HR 60 | Temp 98.2°F | Resp 18 | Ht 73.0 in | Wt 328.0 lb

## 2015-08-09 DIAGNOSIS — I723 Aneurysm of iliac artery: Secondary | ICD-10-CM

## 2015-08-09 MED ORDER — IOPAMIDOL (ISOVUE-370) INJECTION 76%
75.0000 mL | Freq: Once | INTRAVENOUS | Status: AC | PRN
  Administered 2015-08-09: 75 mL via INTRAVENOUS

## 2015-08-09 NOTE — Progress Notes (Signed)
HISTORY AND PHYSICAL     CC:  CT for iliac artery aneurysm Referring Provider:  Burnis Medin, MD  HPI: This is a 64 y.o. male who had appendicitis ~ 1 year ago with rupture and underwent surgery for this.  He did have an open wound that required wet to dry dressings at that time.  This has healed.  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  He has not had any abdominal pain or back pain.  He does chronic back pain as he has had multiple back surgeries in the past.    There is no family hx of aneurysms.   He denies any claudication type symptoms, but states he does have some numbness on the ball of his left foot and to his 2nd toe and states this is from his back issues.    He also has a history of PAF with palpitations as recently as yesterday.  He did go to the ER where he underwent a cardioversion, which was successful.  He has PAF since 2005 and has undergone cardioversion x 9.  He is on tikosyn and Pradaxa for this.  He does have hx of renal cyst on the right kidney that was followed with ultrasound.    He does have a hx of diabetes and he does take Metformin for this.  He is on a statin for cholesterol management.  He does not take any medication for hypertension as he has normal blood pressure.    Past Medical History  Diagnosis Date  . HYPERLIPIDEMIA 03/22/2009  . Obesity, unspecified 04/24/2009  . THROMBOCYTOPENIA 08/16/2010  . ERECTILE DYSFUNCTION 03/22/2009  . SLEEP APNEA, OBSTRUCTIVE 03/22/2009    compliant with CPAP  . 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 Coronary atherosclerosis of native coronary artery 11/2002    a. s/p stent to LAD 12/03; OM2 occluded at cath 12/03; d. myoview 5/10: no ischemia;  e. echo 7/11: EF 55%, BAE, mild RVE, PASP 41-45; Myoview was in March 2013. There was no ischemia or infarction, EF 51%   . Atrial fibrillation 03/22/2009    a. s/p multiple DCCV; b. no coumadin due to low TE risk profile; c. Tikosyn Rx  .  GERD 03/22/2009  . LATERAL EPICONDYLITIS, LEFT 10/24/2009  . HYPERGLYCEMIA 04/25/2010  . LIVER FUNCTION TESTS, ABNORMAL 04/25/2010  . TOBACCO USE, QUIT 10/24/2009  . Umbilical hernia   . Myocardial infarction mi2003  . Local reaction to immunization 05/05/2012    minor resolving  zostavax   . Numbness in left leg     foot related to back disease and surgery  . Diabetes mellitus without complication   . Renal cyst     Characterized by MRI as simple  . Iliac aneurysm     2.6 to be evaluated incidental finding on CT  . Ruptured suppurative appendicitis     2015   . Drusen body     see opth note    Past Surgical History  Procedure Laterality Date  . Cyst on epiglottis  08/2002  . Surgery l4-l5   1998    ruptured x 3  . Stent      LAD DUS 2004  . Ulnar neuropathy    . Umbilical hernia repair      mesh  . Lumbar disc surgery      two  holes in spinalcovering  . Esophagogastroduodenoscopy  11/15/2011    Procedure: ESOPHAGOGASTRODUODENOSCOPY (EGD);  Surgeon: Juanita Craver, MD;  Location: [facility];  Service: Endoscopy;  Laterality: N/A;  . Colonoscopy  11/15/2011    Procedure: COLONOSCOPY;  Surgeon: Juanita Craver, MD;  Location: [facility];  Service: Endoscopy;  Laterality: N/A;  . Laparoscopic appendectomy N/A 06/06/2014    Procedure: APPENDECTOMY LAPAROSCOPIC attemted;  Surgeon: Odis Hollingshead, MD;  Location: [facility];  Service: General;  Laterality: N/A;  . Appendectomy N/A 06/06/2014    Procedure: APPENDECTOMY;  Surgeon: Odis Hollingshead, MD;  Location: [facility];  Service: General;  Laterality: N/A;  . Colon surgery      Allergies  Allergen Reactions  . Penicillins     Childhood reaction   . Procaine Hcl Hives and Other (See Comments)    Red in the face and neck   . Pseudoephedrine Other (See Comments)    Patient went into afib  . Sulfonamide Derivatives     Childhood reaction   . Cardizem [Diltiazem] Rash    Current Outpatient Prescriptions  Medication Sig Dispense  Refill  . atorvastatin (LIPITOR) 80 MG tablet Take 1 tablet by mouth  daily 90 tablet 3  . Blood Glucose Monitoring Suppl (ONETOUCH VERIO IQ SYSTEM) W/DEVICE KIT     . dabigatran (PRADAXA) 150 MG CAPS capsule TAKE 1 CAPSULE BY MOUTH EVERY 12 HOURS 180 capsule 3  . dofetilide (TIKOSYN) 500 MCG capsule Take 1 capsule (500 mcg total) by mouth 2 (two) times daily. 180 capsule 3  . famotidine (PEPCID) 20 MG tablet Take 10-20 mg by mouth daily as needed for heartburn or indigestion.     . fish oil-omega-3 fatty acids 1000 MG capsule Take 1 g by mouth 2 (two) times daily.     [name] MAGNESIUM OXIDE PO Take 750 mg by mouth every evening.     . metFORMIN (GLUCOPHAGE-XR) 500 MG 24 hr tablet Take 1 tablet (500 mg total) by mouth 3 (three) times daily. 270 tablet 3  . MULTIPLE VITAMIN PO Take 1 tablet by mouth every evening.     . nitroGLYCERIN (NITROSTAT) 0.4 MG SL tablet Place 1 tablet (0.4 mg total) under the tongue every 5 (five) minutes as needed. 25 tablet 11  . ONETOUCH DELICA LANCETS 59R MISC USE AS DIRECTED 100 each 11  . ONETOUCH VERIO test strip USE TWICE A DAY 50 each 10  . pantoprazole (PROTONIX) 40 MG tablet TAKE 1 TABLET (40 MG TOTAL) BY MOUTH DAILY. 90 tablet 1  . potassium chloride SA (K-DUR,KLOR-CON) 20 MEQ tablet Take 1 tablet (20 mEq total) by mouth daily. 90 tablet 4  . Suvorexant (BELSOMRA) 10 MG TABS Take 10 mg by mouth at bedtime as needed. 30 tablet 1  . zolpidem (AMBIEN) 10 MG tablet Take 1 tablet (10 mg total) by mouth at bedtime as needed for sleep. 30 tablet 5   No current facility-administered medications for this visit.    Family History  Problem Relation Age of Onset  . Thyroid disease Mother   . Ovarian cancer Mother   . Breast cancer Mother   . Lung cancer Father   . Cancer Father     Social History   Social History  . Marital Status: Married    Spouse Name: N/A  . Number of Children: N/A  . Years of Education: N/A   Occupational History  . Not on file.    Social History Main Topics  . Smoking status: Former Smoker -- 2.00 packs/day for 42 years    Types: Cigarettes    Quit date: 12/30/2008  . Smokeless  tobacco: Never Used     Comment: started at age 7; 1-2 ppd; quit 2010  . Alcohol Use: No  . Drug Use: No  . Sexual Activity: Yes   Other Topics Concern  . Not on file   Social History Narrative   Retired paramedic   Regular exercise-yes not recently    Has children   Wife is overweight and has fibromyalgia and depression on disability doesn't go out much. Back surgery    Has older dog   Retired from [employer] 31 years of service now 7 years      ROS: [x]  Positive   [ ]  Negative   [ ]  All sytems reviewed and are negative  Cardiovascular: []  chest pain/pressure [x]  palpitations []  SOB lying flat []  DOE []  pain in legs while walking []  pain in feet when lying flat []  hx of DVT []  hx of phlebitis []  swelling in legs []  varicose veins  Pulmonary: []  productive cough []  asthma []  wheezing  Neurologic: []  weakness in []  arms []  legs []  numbness in []  arms []  legs [] difficulty speaking or slurred speech []  temporary loss of vision in one eye []  dizziness  Hematologic: []  bleeding problems []  problems with blood clotting easily  GI []  vomiting blood []  blood in stool  GU: []  burning with urination []  blood in urine [x]  hx renal cyst  Psychiatric: []  hx of major depression  Integumentary: []  rashes []  ulcers  Constitutional: []  fever []  chills   PHYSICAL EXAMINATION:  Filed Vitals:   08/09/15 1307  BP: 112/64  Pulse: 60  Temp: 98.2 F (36.8 C)  Resp: 18   Body mass index is 43.28 kg/(m^2).  General:  WDWN  Obese male in NAD Gait: Not observed HENT: WNL, normocephalic Pulmonary: normal non-labored breathing , without Rales, rhonchi,  wheezing Cardiac: RRR, without  Murmurs, rubs or gallops; without carotid bruits Abdomen: soft, NT, no masses Skin: without rashes, without ulcers  Vascular  Exam/Pulses:  Right Left  Radial 2+ (normal) 2+ (normal)  Ulnar 2+ (normal) 2+ (normal)  DP 2+ (normal) 2+ (normal)  PT 2+ (normal) 2+ (normal)   Extremities: without ischemic changes, without Gangrene , without cellulitis; without open wounds; chronic venous stasis changes BLE. Musculoskeletal: no muscle wasting or atrophy  Neurologic: A&O X 3; Appropriate Affect ; SENSATION: normal; MOTOR FUNCTION:  moving all extremities equally. 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   Pt meds includes: Statin:  Yes.   Beta Blocker:  No. Aspirin:  No. ACEI:  No. ARB:  No. Other Antiplatelet/Anticoagulant:  Yes.   Pradaxa   ASSESSMENT/PLAN:: 64 y.o. 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  He denies any abdominal or back pain. -we will have him f/u in one year with repeat CTA as obesity would not allow for an adequate exam with ultrasound.   -pt did state he got a stuffy nose reaction with the contrast and may need to be pre-medicated prior to next CTA.  -he will call sooner if he has any issues.   Leontine Locket, PA-C [facility] [phone]  Clinic MD:  Pt seen and examined in conjunction with Dr. Scot Dock

## 2015-08-10 ENCOUNTER — Ambulatory Visit (INDEPENDENT_AMBULATORY_CARE_PROVIDER_SITE_OTHER): Payer: Medicare Other | Admitting: Cardiology

## 2015-08-10 ENCOUNTER — Encounter: Payer: Self-pay | Admitting: Internal Medicine

## 2015-08-10 ENCOUNTER — Encounter: Payer: Self-pay | Admitting: Cardiology

## 2015-08-10 VITALS — BP 122/70 | HR 63 | Ht 73.0 in | Wt 327.6 lb

## 2015-08-10 DIAGNOSIS — I4891 Unspecified atrial fibrillation: Secondary | ICD-10-CM

## 2015-08-10 DIAGNOSIS — I480 Paroxysmal atrial fibrillation: Secondary | ICD-10-CM | POA: Diagnosis not present

## 2015-08-10 DIAGNOSIS — E119 Type 2 diabetes mellitus without complications: Secondary | ICD-10-CM

## 2015-08-10 NOTE — Progress Notes (Signed)
08/10/2015 [name].   03-Mar-1951  [id number]  Primary Physician Lottie Dawson, MD Primary Cardiologist: Dr. Stanford Breed Electrophysiologist: Dr. Rayann Heman   Reason for Visit/CC:  Follow-up for atrial fibrillation; status post dirrect current cardioversion  HPI:  64 yo man with h/o pAF s/p multiple DCCVs on Tikosyn and Pradaxa who presented to the [facility] ED 08/08/15 with symptomatic atrial fibrillation w/ RVR. On arrival to the ED, his HR was in the 120s, 114 on ECG. He was given diltiazem by ED and developed rash and itching with concerns for allergic reaction and given SoluMedrol and Benadryl. Cardiology was consulted for further assistance in management. He underwent successful DCCV in the ED by Dr. Alphia Moh (night call Fellow). Per records, he received 200 Joules x 1 attempt. Post cardioversion EKG showed sinus brady with a rate of 57 bpm with PACs and PVC, QTc 473 ms. He was discharged home dirrectly from the ED and instructed to continue Tikosyn and Pradaxa.   He now presents back to clinic for post ED f/u. EKG in clinic today demonstrates NSR with a HR of 63 bpm. QT/QTc 440/450 ms. He has been fully compliant with Tikosyn and Pradaxa. Since his cardioversion, he denies any symptoms of breakthrough atrial fibrillation. In retrospect, he recalls that he missed 1 dose of Tikosyn the day prior to the development of atrial fibrillation. He reports that his mother-in-law recently passed away and he subsequently was very busy helping his wife plan funeral arrangements and inadvertently forgot to take his meds. He now reports full compliance with Tikosyn and Pradaxa. He denies any abnormal bleeding with Pradaxa. He also reports full compliance CPAP therapy.   Current Outpatient Prescriptions  Medication Sig Dispense Refill  . atorvastatin (LIPITOR) 80 MG tablet Take 1 tablet by mouth  daily 90 tablet 3  . Blood Glucose Monitoring Suppl (ONETOUCH VERIO IQ SYSTEM) W/DEVICE KIT     .  dabigatran (PRADAXA) 150 MG CAPS capsule TAKE 1 CAPSULE BY MOUTH EVERY 12 HOURS 180 capsule 3  . dofetilide (TIKOSYN) 500 MCG capsule Take 1 capsule (500 mcg total) by mouth 2 (two) times daily. 180 capsule 3  . famotidine (PEPCID) 20 MG tablet Take 10-20 mg by mouth daily as needed for heartburn or indigestion.     . fish oil-omega-3 fatty acids 1000 MG capsule Take 1 g by mouth 2 (two) times daily.     [name] MAGNESIUM OXIDE PO Take 750 mg by mouth every evening.     . metFORMIN (GLUCOPHAGE-XR) 750 MG 24 hr tablet Take 750 mg by mouth daily.    . MULTIPLE VITAMIN PO Take 1 tablet by mouth every evening.     . nitroGLYCERIN (NITROSTAT) 0.4 MG SL tablet Place 1 tablet (0.4 mg total) under the tongue every 5 (five) minutes as needed. 25 tablet 11  . ONETOUCH DELICA LANCETS 09G MISC USE AS DIRECTED 100 each 11  . ONETOUCH VERIO test strip USE TWICE A DAY 50 each 10  . pantoprazole (PROTONIX) 40 MG tablet TAKE 1 TABLET (40 MG TOTAL) BY MOUTH DAILY. 90 tablet 1  . potassium chloride SA (K-DUR,KLOR-CON) 20 MEQ tablet Take 1 tablet (20 mEq total) by mouth daily. 90 tablet 4  . Suvorexant (BELSOMRA) 10 MG TABS Take 10 mg by mouth at bedtime as needed. 30 tablet 1   No current facility-administered medications for this visit.    Allergies  Allergen Reactions  . Penicillins     Childhood reaction   . Procaine Hcl  Hives and Other (See Comments)    Red in the face and neck   . Pseudoephedrine Other (See Comments)    Patient went into afib  . Sulfonamide Derivatives     Childhood reaction   . Cardizem [Diltiazem] Rash    Social History   Social History  . Marital Status: Married    Spouse Name: N/A  . Number of Children: N/A  . Years of Education: N/A   Occupational History  . Not on file.   Social History Main Topics  . Smoking status: Former Smoker -- 2.00 packs/day for 42 years    Types: Cigarettes    Quit date: 12/30/2008  . Smokeless tobacco: Never Used     Comment: started at age  42; 1-2 ppd; quit 2010  . Alcohol Use: No  . Drug Use: No  . Sexual Activity: Yes   Other Topics Concern  . Not on file   Social History Narrative   Retired paramedic   Regular exercise-yes not recently    Has children   Wife is overweight and has fibromyalgia and depression on disability doesn't go out much. Back surgery    Has older dog   Retired from [employer] 31 years of service now 7 years      Review of Systems: General: negative for chills, fever, night sweats or weight changes.  Cardiovascular: negative for chest pain, dyspnea on exertion, edema, orthopnea, palpitations, paroxysmal nocturnal dyspnea or shortness of breath Dermatological: negative for rash Respiratory: negative for cough or wheezing Urologic: negative for hematuria Abdominal: negative for nausea, vomiting, diarrhea, bright red blood per rectum, melena, or hematemesis Neurologic: negative for visual changes, syncope, or dizziness All other systems reviewed and are otherwise negative except as noted above.    Blood pressure 122/70, pulse 63, height 6' 1"  (1.854 m), weight 327 lb 9.6 oz (148.598 kg).  General appearance: alert, cooperative, no distress and morbidly obese Neck: no carotid bruit, no JVD, supple, symmetrical, trachea midline and thyroid not enlarged, symmetric, no tenderness/mass/nodules Lungs: clear to auscultation bilaterally Heart: regular rate and rhythm, S1, S2 normal, no murmur, click, rub or gallop Extremities: no LEE Pulses: 2+ and symmetric Skin: warm and dry Neurologic: Grossly normal  EKG NSR 63 bpm  ASSESSMENT AND PLAN:   1. Paroxysmal atrial fibrillation: Patient had a recent episode of atrial fibrillation with RVR in the setting of medication noncompliance with Tikosyn. He subsequently required direct current cardioversion. His was a successful attempt and he denies any symptoms of breakthrough atrial fibrillation since that time. EKG today shows normal sinus rhythm with a  well-controlled heart rate. He now reports full compliance with Tikosyn and Pradaxa. He was seen in 2012 by Dr. Rayann Heman for consideration for afib ablation, however Dr. Rayann Heman recommended that he lose weight before being considered for the procedure. Dr. Rayann Heman feels that he would have a higher success rate and lower chance for risk if he can maintain a weight under 300 pounds. He continues to be overweight at 324 pounds. We will therefore continue rhythm control strategy for now. Weight loss was encouraged. Patient was advised to continue full compliance with CPAP therapy.   PLAN  Continue current plan of care. Keep follow-up appointment with Dr. Stanford Breed 11/27/2015.  Lyda Jester PA-C 08/10/2015 12:01 PM

## 2015-08-10 NOTE — Patient Instructions (Signed)
Brittainy Simmons, PA-C, has made no changes today in your current medications or treatment plan.  Please follow-up with Dr Stanford Breed in [place].

## 2015-08-11 ENCOUNTER — Other Ambulatory Visit: Payer: Self-pay | Admitting: Family Medicine

## 2015-08-11 ENCOUNTER — Other Ambulatory Visit (INDEPENDENT_AMBULATORY_CARE_PROVIDER_SITE_OTHER): Payer: Medicare Other

## 2015-08-11 ENCOUNTER — Telehealth: Payer: Self-pay | Admitting: Internal Medicine

## 2015-08-11 DIAGNOSIS — Z79899 Other long term (current) drug therapy: Secondary | ICD-10-CM

## 2015-08-11 LAB — BASIC METABOLIC PANEL WITH GFR
BUN: 22 mg/dL (ref 7–25)
CO2: 26 mmol/L (ref 20–31)
Calcium: 9.2 mg/dL (ref 8.6–10.3)
Chloride: 104 mmol/L (ref 98–110)
Creat: 0.86 mg/dL (ref 0.70–1.25)
GFR, Est African American: >89 mL/min (ref >=60)
GFR, Est Non African American: >89 mL/min (ref >=60)
Glucose, Bld: 125 mg/dL — ABNORMAL HIGH (ref 65–99)
Potassium: 4.2 mmol/L (ref 3.5–5.3)
Sodium: 142 mmol/L (ref 135–146)

## 2015-08-11 NOTE — Telephone Encounter (Signed)
Per pt he had the test done on 08/09/2015 at 11 am.  Pt was instructed to wait 48 hours before resuming metformin. The last time pt took the medication was Wednesday morning (8/10).  Per Tommi Rumps advised pt that he would look out for his labs over the weekend and only call pt if needed. Instructed the pt that if labs were abnormal he would be contacted over the weekend.  Ok per [facility] for pt to resume metformin on Saturday morning 8.13.2016.  Pt verbalized understanding.

## 2015-08-11 NOTE — Telephone Encounter (Signed)
Pt had a ct w/ iodine contrast on Wednesday.  He has been without his Metformin for 48 hours.  [name] had pt to get a kidney function test done to make sure iodine was out of his system.  He wanted to know if he was supposed to resume his Metformin today since if he didn't he would be off of it all weekend.

## 2015-08-24 ENCOUNTER — Other Ambulatory Visit (INDEPENDENT_AMBULATORY_CARE_PROVIDER_SITE_OTHER): Payer: Medicare Other

## 2015-08-24 DIAGNOSIS — N400 Enlarged prostate without lower urinary tract symptoms: Secondary | ICD-10-CM

## 2015-08-24 DIAGNOSIS — E119 Type 2 diabetes mellitus without complications: Secondary | ICD-10-CM | POA: Diagnosis not present

## 2015-08-24 DIAGNOSIS — E785 Hyperlipidemia, unspecified: Secondary | ICD-10-CM

## 2015-08-24 DIAGNOSIS — Z79899 Other long term (current) drug therapy: Secondary | ICD-10-CM

## 2015-08-24 LAB — BASIC METABOLIC PANEL WITH GFR
BUN: 17 mg/dL (ref 7–25)
CO2: 27 mmol/L (ref 20–31)
Calcium: 9.4 mg/dL (ref 8.6–10.3)
Chloride: 104 mmol/L (ref 98–110)
Creat: 0.86 mg/dL (ref 0.70–1.25)
GFR, Est African American: >89 mL/min (ref >=60)
GFR, Est Non African American: >89 mL/min (ref >=60)
Glucose, Bld: 119 mg/dL — ABNORMAL HIGH (ref 65–99)
Potassium: 4.8 mmol/L (ref 3.5–5.3)
Sodium: 140 mmol/L (ref 135–146)

## 2015-08-24 LAB — PSA: PSA: 0.78 ng/mL (ref 0.10–4.00)

## 2015-08-24 LAB — LIPID PANEL
Cholesterol: 99 mg/dL (ref 0–200)
HDL: 31 mg/dL — ABNORMAL LOW (ref >39.00)
LDL Cholesterol: 28 mg/dL (ref 0–99)
NonHDL: 68.01
Total CHOL/HDL Ratio: 3
Triglycerides: 200 mg/dL — ABNORMAL HIGH (ref 0.0–149.0)
VLDL: 40 mg/dL (ref 0.0–40.0)

## 2015-08-24 LAB — HEMOGLOBIN A1C: Hgb A1c MFr Bld: 6.9 % — ABNORMAL HIGH (ref 4.6–6.5)

## 2015-08-31 ENCOUNTER — Encounter: Payer: Self-pay | Admitting: Internal Medicine

## 2015-08-31 ENCOUNTER — Ambulatory Visit (INDEPENDENT_AMBULATORY_CARE_PROVIDER_SITE_OTHER): Payer: Medicare Other | Admitting: Internal Medicine

## 2015-08-31 VITALS — BP 132/74 | Temp 98.4°F | Ht 73.0 in | Wt 325.0 lb

## 2015-08-31 DIAGNOSIS — Z23 Encounter for immunization: Secondary | ICD-10-CM | POA: Diagnosis not present

## 2015-08-31 DIAGNOSIS — E785 Hyperlipidemia, unspecified: Secondary | ICD-10-CM | POA: Diagnosis not present

## 2015-08-31 DIAGNOSIS — I4891 Unspecified atrial fibrillation: Secondary | ICD-10-CM

## 2015-08-31 DIAGNOSIS — E1159 Type 2 diabetes mellitus with other circulatory complications: Secondary | ICD-10-CM

## 2015-08-31 DIAGNOSIS — Z7901 Long term (current) use of anticoagulants: Secondary | ICD-10-CM

## 2015-08-31 LAB — MICROALBUMIN / CREATININE URINE RATIO
Creatinine,U: 226.2 mg/dL
Microalb Creat Ratio: 3.8 mg/g (ref 0.0–30.0)
Microalb, Ur: 8.6 mg/dL — ABNORMAL HIGH (ref 0.0–1.9)

## 2015-08-31 NOTE — Patient Instructions (Addendum)
Continue lifestyle intervention healthy eating and exercise . Decrease   Portion  size  .  Let us know when need to go dietician  Nutrition referral  Urine screen today .  ofr protein.  Can do  Hep c aby with  next labs .     Wt Readings from Last 3 Encounters:  08/10/15 327 lb 9.6 oz (148.598 kg)  08/09/15 328 lb (148.78 kg)  05/09/15 331 lb 12.8 oz (150.503 kg)

## 2015-08-31 NOTE — Progress Notes (Signed)
Pre visit review using our clinic review tool, if applicable. No additional management support is needed unless otherwise documented below in the visit note.  Chief Complaint  Patient presents with  . Follow-up  . Diabetes    HPI: [name] [name]. 64 y.o.  comes in for chronic disease/ medication management  Walking 1.5 miles limiting carbs.     Had tpo be cardioverted 23 weeks ago . A  still busy.  Needs weigh tdown to be eligible for ablation  DM doing fine  No se med  No numbness  Trying to lose weight .   Now on 1500 mg metformin per day   To get eye check this month  glassss some   Drusen body .   Glaucoma check .   ROS: See pertinent positives and negatives per HPI.  Past Medical History  Diagnosis Date  . HYPERLIPIDEMIA 03/22/2009  . Obesity, unspecified 04/24/2009  . THROMBOCYTOPENIA 08/16/2010  . ERECTILE DYSFUNCTION 03/22/2009  . SLEEP APNEA, OBSTRUCTIVE 03/22/2009    compliant with CPAP  . 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 Coronary atherosclerosis of native coronary artery 11/2002    a. s/p stent to LAD 12/03; OM2 occluded at cath 12/03; d. myoview 5/10: no ischemia;  e. echo 7/11: EF 55%, BAE, mild RVE, PASP 41-45; Myoview was in March 2013. There was no ischemia or infarction, EF 51%   . Atrial fibrillation 03/22/2009    a. s/p multiple DCCV; b. no coumadin due to low TE risk profile; c. Tikosyn Rx  . GERD 03/22/2009  . LATERAL EPICONDYLITIS, LEFT 10/24/2009  . HYPERGLYCEMIA 04/25/2010  . LIVER FUNCTION TESTS, ABNORMAL 04/25/2010  . TOBACCO USE, QUIT 10/24/2009  . Umbilical hernia   . Myocardial infarction mi2003  . Local reaction to immunization 05/05/2012    minor resolving  zostavax   . Numbness in left leg     foot related to back disease and surgery  . Diabetes mellitus without complication   . Renal cyst     Characterized by MRI as simple  . Iliac aneurysm     2.6 to be evaluated incidental finding on CT  . Ruptured suppurative appendicitis     2015   .  Drusen body     see opth note  . Perforated appendicitis with necrosis s/p open appendectomy 06/07/14 06/04/2014    Family History  Problem Relation Age of Onset  . Thyroid disease Mother   . Ovarian cancer Mother   . Breast cancer Mother   . Lung cancer Father   . Cancer Father     Social History   Social History  . Marital Status: Married    Spouse Name: N/A  . Number of Children: N/A  . Years of Education: N/A   Social History Main Topics  . Smoking status: Former Smoker -- 2.00 packs/day for 42 years    Types: Cigarettes    Quit date: 12/30/2008  . Smokeless tobacco: Never Used     Comment: started at age 41; 1-2 ppd; quit 2010  . Alcohol Use: No  . Drug Use: No  . Sexual Activity: Yes   Other Topics Concern  . None   Social History Narrative   Retired paramedic   Regular exercise-yes not recently    Has children   Wife is overweight and has fibromyalgia and depression on disability doesn't go out much. Back surgery    Has older dog   Retired from [employer] 31 years of service now 7  years     Outpatient Prescriptions Prior to Visit  Medication Sig Dispense Refill  . atorvastatin (LIPITOR) 80 MG tablet Take 1 tablet by mouth  daily 90 tablet 3  . Blood Glucose Monitoring Suppl (ONETOUCH VERIO IQ SYSTEM) W/DEVICE KIT     . dabigatran (PRADAXA) 150 MG CAPS capsule TAKE 1 CAPSULE BY MOUTH EVERY 12 HOURS 180 capsule 3  . dofetilide (TIKOSYN) 500 MCG capsule Take 1 capsule (500 mcg total) by mouth 2 (two) times daily. 180 capsule 3  . famotidine (PEPCID) 20 MG tablet Take 10-20 mg by mouth daily as needed for heartburn or indigestion.     . fish oil-omega-3 fatty acids 1000 MG capsule Take 1 g by mouth 2 (two) times daily.     [name] MAGNESIUM OXIDE PO Take 750 mg by mouth every evening.     . MULTIPLE VITAMIN PO Take 1 tablet by mouth every evening.     . nitroGLYCERIN (NITROSTAT) 0.4 MG SL tablet Place 1 tablet (0.4 mg total) under the tongue every 5 (five) minutes as needed.  25 tablet 11  . ONETOUCH DELICA LANCETS 62E MISC USE AS DIRECTED 100 each 11  . ONETOUCH VERIO test strip USE TWICE A DAY 50 each 10  . pantoprazole (PROTONIX) 40 MG tablet TAKE 1 TABLET (40 MG TOTAL) BY MOUTH DAILY. 90 tablet 1  . potassium chloride SA (K-DUR,KLOR-CON) 20 MEQ tablet Take 1 tablet (20 mEq total) by mouth daily. 90 tablet 4  . Suvorexant (BELSOMRA) 10 MG TABS Take 10 mg by mouth at bedtime as needed. 30 tablet 1  . metFORMIN (GLUCOPHAGE-XR) 750 MG 24 hr tablet Take 750 mg by mouth daily.     No facility-administered medications prior to visit.     EXAM:  BP 132/74 mmHg  Temp(Src) 98.4 F (36.9 C) (Oral)  Ht 6' 1"  (1.854 m)  Wt 325 lb (147.419 kg)  BMI 42.89 kg/m2  Body mass index is 42.89 kg/(m^2).  GENERAL: vitals reviewed and listed above, alert, oriented, appears well hydrated and in no acute distress HEENT: atraumatic, conjunctiva  clear, no obvious abnormalities on inspection of external nose and ears NECK: no obvious masses on inspection palpation  LUNGS: clear to auscultation bilaterally, no wheezes, rales or rhonchi, good air movement CV: HRRR, no clubbing cyanosis or  peripheral edema nl cap refill  MS: moves all extremities without noticeable focal  abnormality PSYCH: pleasant and cooperative, no obvious depression or anxiety Lab Results  Component Value Date   WBC 9.1 08/08/2015   HGB 17.1* 08/08/2015   HCT 47.9 08/08/2015   PLT 183 08/08/2015   GLUCOSE 119* 08/24/2015   CHOL 99 08/24/2015   TRIG 200.0* 08/24/2015   HDL 31.00* 08/24/2015   LDLCALC 28 08/24/2015   ALT 44 06/30/2014   AST 34 06/30/2014   NA 140 08/24/2015   K 4.8 08/24/2015   CL 104 08/24/2015   CREATININE 0.86 08/24/2015   BUN 17 08/24/2015   CO2 27 08/24/2015   TSH 1.96 06/30/2014   PSA 0.78 08/24/2015   INR 1.27 06/05/2014   HGBA1C 6.9* 08/24/2015   MICROALBUR 8.6* 08/31/2015    ASSESSMENT AND PLAN:  Discussed the following assessment and plan:  Type 2 diabetes  mellitus with other circulatory complications - stable  adequate control  - Plan: Microalbumin / creatinine urine ratio  Chronic anticoagulation  Atrial fibrillation, unspecified  Hyperlipidemia  Atrial fibrillation with RVR  Need for prophylactic vaccination and inoculation against influenza - Plan: Flu vaccine  HIGH DOSE PF (Fluzone High dose)  Morbid obesity - continue lsi will let us know  if needs dietnutrion consult referral. 4-6 months ( yearly usually in may -Patient advised to return or notify health care team  if  or new concerns arise.  Patient Instructions   Continue lifestyle intervention healthy eating and exercise . Decrease   Portion  size  .  Let us know when need to go dietician  Nutrition referral  Urine screen today .  ofr protein.  Can do  Hep c aby with  next labs .     Wt Readings from Last 3 Encounters:  08/10/15 327 lb 9.6 oz (148.598 kg)  08/09/15 328 lb (148.78 kg)  05/09/15 331 lb 12.8 oz (150.503 kg)     Reason Helzer K. Shamarion Coots M.D.

## 2015-09-15 ENCOUNTER — Encounter: Payer: Self-pay | Admitting: Internal Medicine

## 2015-09-18 LAB — HM DIABETES EYE EXAM

## 2015-09-20 ENCOUNTER — Encounter: Payer: Self-pay | Admitting: Family Medicine

## 2015-09-20 NOTE — Telephone Encounter (Signed)
First   Increase  To 20 mg of the belsopmra before   Deciding to switch  .  Disp   30  Let us know  How this works  (If not helpful then we could  go back to [place]   limiting use to 3-4 days per week . Ambien is  considered a higher risk medication  And there are  Warning s   In 71  And over age group and is not advised .for regular use . )

## 2015-09-22 MED ORDER — SUVOREXANT 20 MG PO TABS: 1.0000 | ORAL_TABLET | Freq: Every day | ORAL | Status: DC

## 2015-10-06 ENCOUNTER — Ambulatory Visit (INDEPENDENT_AMBULATORY_CARE_PROVIDER_SITE_OTHER): Payer: Medicare Other | Admitting: Internal Medicine

## 2015-10-06 ENCOUNTER — Encounter: Payer: Self-pay | Admitting: Internal Medicine

## 2015-10-06 VITALS — BP 140/76 | HR 71 | Temp 98.2°F | Ht 73.0 in | Wt 325.8 lb

## 2015-10-06 DIAGNOSIS — J22 Unspecified acute lower respiratory infection: Secondary | ICD-10-CM

## 2015-10-06 DIAGNOSIS — J988 Other specified respiratory disorders: Secondary | ICD-10-CM | POA: Diagnosis not present

## 2015-10-06 DIAGNOSIS — J019 Acute sinusitis, unspecified: Secondary | ICD-10-CM

## 2015-10-06 DIAGNOSIS — J40 Bronchitis, not specified as acute or chronic: Secondary | ICD-10-CM | POA: Diagnosis not present

## 2015-10-06 MED ORDER — DOXYCYCLINE HYCLATE 100 MG PO TABS: 100.0000 mg | ORAL_TABLET | Freq: Two times a day (BID) | ORAL | Status: DC

## 2015-10-06 MED ORDER — ALBUTEROL SULFATE HFA 108 (90 BASE) MCG/ACT IN AERS: 2.0000 | INHALATION_SPRAY | Freq: Four times a day (QID) | RESPIRATORY_TRACT | Status: DC | PRN

## 2015-10-06 MED ORDER — HYDROCODONE-HOMATROPINE 5-1.5 MG/5ML PO SYRP: ORAL_SOLUTION | ORAL | Status: DC

## 2015-10-06 NOTE — Patient Instructions (Signed)
Respiratory infection  May be viral  however with the severity  You may benefit from antibiotic  Inhaler as needed  Cough med fluids  Your o2 is good  Try afrin ns at night for 2-3 nights  To help .  Fu if  persistent or progressive  As needed  May take 10 -1 4 days to [facility] but should be better in the next 3-4 days .

## 2015-10-06 NOTE — Progress Notes (Signed)
Pre visit review using our clinic review tool, if applicable. No additional management support is needed unless otherwise documented below in the visit note.   Chief Complaint  Patient presents with  . Productive Cough    Started on Tuesday.  . Shortness of Breath  . Nasal Congestion  . Sore Throat  . Sneezing    HPI:  Patient [name] [name].  comes in today for SDA for  new problem evaluation. Last time felt this bad and 2012   Began head cold and now green phlegm  Some sob .    Nasal congestion mild headache no  Fever chills rigors  On cpap   Had  Illness and had cortisone  Hand pred and   Antibiotic  And  Inhaler.  Helped  No  Hemoptysis.    ROS: See pertinent positives and negatives per HPI.no cp  V d different than head cold chest cold   Past Medical History  Diagnosis Date  . HYPERLIPIDEMIA 03/22/2009  . Obesity, unspecified 04/24/2009  . THROMBOCYTOPENIA 08/16/2010  . ERECTILE DYSFUNCTION 03/22/2009  . SLEEP APNEA, OBSTRUCTIVE 03/22/2009    compliant with CPAP  . 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 Coronary atherosclerosis of native coronary artery 11/2002    a. s/p stent to LAD 12/03; OM2 occluded at cath 12/03; d. myoview 5/10: no ischemia;  e. echo 7/11: EF 55%, BAE, mild RVE, PASP 41-45; Myoview was in March 2013. There was no ischemia or infarction, EF 51%   . Atrial fibrillation 03/22/2009    a. s/p multiple DCCV; b. no coumadin due to low TE risk profile; c. Tikosyn Rx  . GERD 03/22/2009  . LATERAL EPICONDYLITIS, LEFT 10/24/2009  . HYPERGLYCEMIA 04/25/2010  . LIVER FUNCTION TESTS, ABNORMAL 04/25/2010  . TOBACCO USE, QUIT 10/24/2009  . Umbilical hernia   . Myocardial infarction mi2003  . Local reaction to immunization 05/05/2012    minor resolving  zostavax   . Numbness in left leg     foot related to back disease and surgery  . Diabetes mellitus without complication   . Renal cyst     Characterized by MRI as simple  . Iliac aneurysm     2.6 to be evaluated  incidental finding on CT  . Ruptured suppurative appendicitis     2015   . Drusen body     see opth note  . Perforated appendicitis with necrosis s/p open appendectomy 06/07/14 06/04/2014    Family History  Problem Relation Age of Onset  . Thyroid disease Mother   . Ovarian cancer Mother   . Breast cancer Mother   . Lung cancer Father   . Cancer Father     Social History   Social History  . Marital Status: Married    Spouse Name: N/A  . Number of Children: N/A  . Years of Education: N/A   Social History Main Topics  . Smoking status: Former Smoker -- 2.00 packs/day for 42 years    Types: Cigarettes    Quit date: 12/30/2008  . Smokeless tobacco: Never Used     Comment: started at age 69; 1-2 ppd; quit 2010  . Alcohol Use: No  . Drug Use: No  . Sexual Activity: Yes   Other Topics Concern  . Not on file   Social History Narrative   Retired paramedic   Regular exercise-yes not recently    Has children   Wife is overweight and has fibromyalgia and depression on disability doesn't go out  much. Back surgery    Has older dog   Retired from [employer] 31 years of service now 7 years     Outpatient Prescriptions Prior to Visit  Medication Sig Dispense Refill  . atorvastatin (LIPITOR) 80 MG tablet Take 1 tablet by mouth  daily 90 tablet 3  . Blood Glucose Monitoring Suppl (ONETOUCH VERIO IQ SYSTEM) W/DEVICE KIT     . dabigatran (PRADAXA) 150 MG CAPS capsule TAKE 1 CAPSULE BY MOUTH EVERY 12 HOURS 180 capsule 3  . dofetilide (TIKOSYN) 500 MCG capsule Take 1 capsule (500 mcg total) by mouth 2 (two) times daily. 180 capsule 3  . famotidine (PEPCID) 20 MG tablet Take 10-20 mg by mouth daily as needed for heartburn or indigestion.     . fish oil-omega-3 fatty acids 1000 MG capsule Take 1 g by mouth 2 (two) times daily.     [name] MAGNESIUM OXIDE PO Take 750 mg by mouth every evening.     . metFORMIN (GLUCOPHAGE-XR) 500 MG 24 hr tablet Take 1,500 mg by mouth daily.  1  . MULTIPLE VITAMIN PO  Take 1 tablet by mouth every evening.     . nitroGLYCERIN (NITROSTAT) 0.4 MG SL tablet Place 1 tablet (0.4 mg total) under the tongue every 5 (five) minutes as needed. 25 tablet 11  . ONETOUCH DELICA LANCETS 03E MISC USE AS DIRECTED 100 each 11  . ONETOUCH VERIO test strip USE TWICE A DAY 50 each 10  . pantoprazole (PROTONIX) 40 MG tablet TAKE 1 TABLET (40 MG TOTAL) BY MOUTH DAILY. 90 tablet 1  . potassium chloride SA (K-DUR,KLOR-CON) 20 MEQ tablet Take 1 tablet (20 mEq total) by mouth daily. 90 tablet 4  . Suvorexant (BELSOMRA) 20 MG TABS Take 1 tablet by mouth at bedtime. 30 tablet 0   No facility-administered medications prior to visit.     EXAM:  BP 140/76 mmHg  Pulse 71  Temp(Src) 98.2 F (36.8 C) (Oral)  Ht _0  (1.854 m)  Wt 325 lb 12.8 oz (147.782 kg)  BMI 42.99 kg/m2  SpO2 97%  Body mass index is 42.99 kg/(m^2).  GENERAL: vitals reviewed and listed above, alert, oriented, appears well hydrated and in no acute distress very congested  Mouth breathing  HEENT: atraumatic, conjunctiva  clear, no obvious abnormalities on inspection of external nose and ears OP : no lesion edema or exudate nares 3+ min facila tender frtontal  NECK: no obvious masses on inspection palpation  LUNGS: exp rhonchi  Post cough loosens  Ex wheezy  CV: HRRR, no clubbing nl color nl cap refill  MS: moves all extremities without noticeable focal  abnormality PSYCH: pleasant and cooperative, no obvious depression or anxiety See last x ray   chronic  Bronchitis changes?  ASSESSMENT AND PLAN:  Discussed the following assessment and plan:  Acute respiratory infection  Wheezy bronchitis  Acute rhinosinusitis At risk for complications    Hx of afib cant take decongestianst   Add antibiotic over weekend  Inhaler had no se  For af in the past    -Patient advised to return or notify health care team  if symptoms worsen ,persist or new concerns arise.  Patient Instructions  Respiratory infection  May be  viral  however with the severity  You may benefit from antibiotic  Inhaler as needed  Cough med fluids  Your o2 is good  Try afrin ns at night for 2-3 nights  To help .  Fu if  persistent or progressive  As needed  May take 10 -1 4 days to [facility] but should be better in the next 3-4 days .     Standley Brooking. Panosh M.D.

## 2015-10-09 ENCOUNTER — Other Ambulatory Visit: Payer: Self-pay | Admitting: Internal Medicine

## 2015-10-09 NOTE — Telephone Encounter (Signed)
Sent to the pharmacy by e-scribe. 

## 2015-10-26 ENCOUNTER — Other Ambulatory Visit: Payer: Self-pay | Admitting: Internal Medicine

## 2015-10-26 NOTE — Telephone Encounter (Signed)
Sent to the pharmacy by e-scribe.  Pt to return in Jan for lab and follow up.

## 2015-11-01 ENCOUNTER — Other Ambulatory Visit: Payer: Self-pay | Admitting: Internal Medicine

## 2015-11-01 NOTE — Telephone Encounter (Signed)
Sent to the pharmacy by e-scribe. 

## 2015-11-20 NOTE — Progress Notes (Signed)
HPI: FU hx of CAD s/p stent to LAD in 2003, parox AFib, HL, OSA. He underwent DCCV in 05/2012 for AFib. He has seen Dr. Thompson Grayer and ablation could be considered if he lost weight. Patient seen in emergency room in August with recurrent atrial fibrillation. He was given Cardizem but developed a rash. He underwent successful cardioversion.Continued on tikosyn. Since last seen, the patient has dyspnea with more extreme activities but not with routine activities. It is relieved with rest. It is not associated with chest pain. There is no orthopnea, PND or pedal edema. There is no syncope or palpitations. There is no exertional chest pain.   Current Outpatient Prescriptions  Medication Sig Dispense Refill  . albuterol (PROVENTIL HFA;VENTOLIN HFA) 108 (90 BASE) MCG/ACT inhaler Inhale 2 puffs into the lungs every 6 (six) hours as needed for wheezing or shortness of breath. 1 Inhaler 1  . atorvastatin (LIPITOR) 80 MG tablet Take 1 tablet by mouth  daily 90 tablet 3  . Blood Glucose Monitoring Suppl (ONETOUCH VERIO IQ SYSTEM) W/DEVICE KIT     . dabigatran (PRADAXA) 150 MG CAPS capsule TAKE 1 CAPSULE BY MOUTH EVERY 12 HOURS 180 capsule 3  . dofetilide (TIKOSYN) 500 MCG capsule Take 1 capsule (500 mcg total) by mouth 2 (two) times daily. 180 capsule 3  . famotidine (PEPCID) 20 MG tablet Take 10-20 mg by mouth daily as needed for heartburn or indigestion.     . fish oil-omega-3 fatty acids 1000 MG capsule Take 1 g by mouth 2 (two) times daily.     [name] MAGNESIUM OXIDE PO Take 750 mg by mouth every evening.     . metFORMIN (GLUCOPHAGE-XR) 500 MG 24 hr tablet Take 1,500 mg by mouth daily.  1  . metFORMIN (GLUCOPHAGE-XR) 500 MG 24 hr tablet TAKE 1 TABLET (500 MG TOTAL) BY MOUTH 2 (TWO) TIMES DAILY. 180 tablet 0  . MULTIPLE VITAMIN PO Take 1 tablet by mouth every evening.     . nitroGLYCERIN (NITROSTAT) 0.4 MG SL tablet Place 1 tablet (0.4 mg total) under the tongue every 5 (five) minutes as needed. 25  tablet 11  . ONETOUCH DELICA LANCETS 82N MISC USE AS DIRECTED 100 each 11  . ONETOUCH VERIO test strip USE TWICE A DAY 50 each 11  . pantoprazole (PROTONIX) 40 MG tablet TAKE 1 TABLET (40 MG TOTAL) BY MOUTH DAILY. 90 tablet 1  . potassium chloride SA (K-DUR,KLOR-CON) 20 MEQ tablet Take 1 tablet (20 mEq total) by mouth daily. 90 tablet 4  . Suvorexant (BELSOMRA) 20 MG TABS Take 1 tablet by mouth at bedtime. 30 tablet 0   No current facility-administered medications for this visit.     Past Medical History  Diagnosis Date  . HYPERLIPIDEMIA 03/22/2009  . Obesity, unspecified 04/24/2009  . THROMBOCYTOPENIA 08/16/2010  . ERECTILE DYSFUNCTION 03/22/2009  . SLEEP APNEA, OBSTRUCTIVE 03/22/2009    compliant with CPAP  . 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 Coronary atherosclerosis of native coronary artery 11/2002    a. s/p stent to LAD 12/03; OM2 occluded at cath 12/03; d. myoview 5/10: no ischemia;  e. echo 7/11: EF 55%, BAE, mild RVE, PASP 41-45; Myoview was in March 2013. There was no ischemia or infarction, EF 51%   . Atrial fibrillation ([place]) 03/22/2009    a. s/p multiple DCCV; b. no coumadin due to low TE risk profile; c. Tikosyn Rx  . GERD 03/22/2009  . LATERAL EPICONDYLITIS, LEFT 10/24/2009  . HYPERGLYCEMIA 04/25/2010  .  LIVER FUNCTION TESTS, ABNORMAL 04/25/2010  . TOBACCO USE, QUIT 10/24/2009  . Umbilical hernia   . Myocardial infarction ([place]) mi2003  . Local reaction to immunization 05/05/2012    minor resolving  zostavax   . Numbness in left leg     foot related to back disease and surgery  . Diabetes mellitus without complication ([place])   . Renal cyst     Characterized by MRI as simple  . Iliac aneurysm ([facility])     2.6 to be evaluated incidental finding on CT  . Ruptured suppurative appendicitis     2015   . Drusen body     see opth note  . Perforated appendicitis with necrosis s/p open appendectomy 06/07/14 06/04/2014    Past Surgical History  Procedure Laterality Date  . Cyst on  epiglottis  08/2002  . Surgery l4-l5   1998    ruptured x 3  . Stent      LAD DUS 2004  . Ulnar neuropathy    . Umbilical hernia repair      mesh  . Lumbar disc surgery      two  holes in spinalcovering  . Esophagogastroduodenoscopy  11/15/2011    Procedure: ESOPHAGOGASTRODUODENOSCOPY (EGD);  Surgeon: Juanita Craver, MD;  Location: [facility];  Service: Endoscopy;  Laterality: N/A;  . Colonoscopy  11/15/2011    Procedure: COLONOSCOPY;  Surgeon: Juanita Craver, MD;  Location: [facility];  Service: Endoscopy;  Laterality: N/A;  . Laparoscopic appendectomy N/A 06/06/2014    Procedure: APPENDECTOMY LAPAROSCOPIC attemted;  Surgeon: Odis Hollingshead, MD;  Location: [facility];  Service: General;  Laterality: N/A;  . Appendectomy N/A 06/06/2014    Procedure: APPENDECTOMY;  Surgeon: Odis Hollingshead, MD;  Location: [facility];  Service: General;  Laterality: N/A;  . Colon surgery      Social History   Social History  . Marital Status: Married    Spouse Name: N/A  . Number of Children: N/A  . Years of Education: N/A   Occupational History  . Not on file.   Social History Main Topics  . Smoking status: Former Smoker -- 2.00 packs/day for 42 years    Types: Cigarettes    Quit date: 12/30/2008  . Smokeless tobacco: Never Used     Comment: started at age 4; 1-2 ppd; quit 2010  . Alcohol Use: No  . Drug Use: No  . Sexual Activity: Yes   Other Topics Concern  . Not on file   Social History Narrative   Retired paramedic   Regular exercise-yes not recently    Has children   Wife is overweight and has fibromyalgia and depression on disability doesn't go out much. Back surgery    Has older dog   Retired from [employer] 31 years of service now 7 years     ROS: no fevers or chills, productive cough, hemoptysis, dysphasia, odynophagia, melena, hematochezia, dysuria, hematuria, rash, seizure activity, orthopnea, PND, pedal edema, claudication. Remaining systems are negative.  Physical  Exam: Well-developed obese in no acute distress.  Skin is warm and dry.  HEENT is normal.  Neck is supple.  Chest is clear to auscultation with normal expansion.  Cardiovascular exam is regular rate and rhythm.  Abdominal exam nontender or distended. No masses palpated. Extremities show 1+ ankle edema. neuro grossly intact  ECG Sinus rhythm, left axis deviation, RV conduction delay.

## 2015-11-27 ENCOUNTER — Encounter: Payer: Self-pay | Admitting: Cardiology

## 2015-11-27 ENCOUNTER — Ambulatory Visit (INDEPENDENT_AMBULATORY_CARE_PROVIDER_SITE_OTHER): Payer: Medicare Other | Admitting: Cardiology

## 2015-11-27 VITALS — BP 126/74 | HR 82 | Ht 73.0 in | Wt 326.0 lb

## 2015-11-27 DIAGNOSIS — E785 Hyperlipidemia, unspecified: Secondary | ICD-10-CM

## 2015-11-27 DIAGNOSIS — I2510 Atherosclerotic heart disease of native coronary artery without angina pectoris: Secondary | ICD-10-CM

## 2015-11-27 DIAGNOSIS — I723 Aneurysm of iliac artery: Secondary | ICD-10-CM | POA: Diagnosis not present

## 2015-11-27 DIAGNOSIS — I4891 Unspecified atrial fibrillation: Secondary | ICD-10-CM

## 2015-11-27 LAB — BASIC METABOLIC PANEL
BUN: 17 mg/dL (ref 7–25)
CO2: 26 mmol/L (ref 20–31)
Calcium: 9.4 mg/dL (ref 8.6–10.3)
Chloride: 103 mmol/L (ref 98–110)
Creat: 0.95 mg/dL (ref 0.70–1.25)
Glucose, Bld: 161 mg/dL — ABNORMAL HIGH (ref 65–99)
Potassium: 4.1 mmol/L (ref 3.5–5.3)
Sodium: 140 mmol/L (ref 135–146)

## 2015-11-27 LAB — CBC
HCT: 47.9 % (ref 39.0–52.0)
Hemoglobin: 16.3 g/dL (ref 13.0–17.0)
MCH: 31.3 pg (ref 26.0–34.0)
MCHC: 34 g/dL (ref 30.0–36.0)
MCV: 92.1 fL (ref 78.0–100.0)
MPV: 11.9 fL (ref 8.6–12.4)
Platelets: 144 10*3/uL — ABNORMAL LOW (ref 150–400)
RBC: 5.2 MIL/uL (ref 4.22–5.81)
RDW: 12.8 % (ref 11.5–15.5)
WBC: 8.1 10*3/uL (ref 4.0–10.5)

## 2015-11-27 LAB — MAGNESIUM: Magnesium: 1.8 mg/dL (ref 1.5–2.5)

## 2015-11-27 NOTE — Assessment & Plan Note (Signed)
Patient remains in sinus rhythm. Continue tikosyn. Check renal function, potassium and magnesium. Continue pradaxa. Check hemoglobin.

## 2015-11-27 NOTE — Assessment & Plan Note (Signed)
Followed by vascular surgery. 

## 2015-11-27 NOTE — Assessment & Plan Note (Addendum)
Continue statin. Not on aspirin given the need for anticoagulation. 

## 2015-11-27 NOTE — Assessment & Plan Note (Signed)
Discussed weight loss; activities limited by back and lower ext pain.

## 2015-11-27 NOTE — Assessment & Plan Note (Signed)
Continue statin. 

## 2015-11-27 NOTE — Patient Instructions (Addendum)

## 2015-12-04 ENCOUNTER — Other Ambulatory Visit: Payer: Self-pay | Admitting: Internal Medicine

## 2015-12-06 NOTE — Telephone Encounter (Signed)
Sent to the pharmacy by e-scribe. 

## 2015-12-11 ENCOUNTER — Other Ambulatory Visit: Payer: Self-pay | Admitting: Internal Medicine

## 2015-12-11 MED ORDER — METFORMIN HCL ER 500 MG PO TB24: 1500.0000 mg | ORAL_TABLET | Freq: Every day | ORAL | Status: DC

## 2015-12-15 ENCOUNTER — Encounter: Payer: Self-pay | Admitting: Internal Medicine

## 2015-12-19 ENCOUNTER — Other Ambulatory Visit (INDEPENDENT_AMBULATORY_CARE_PROVIDER_SITE_OTHER): Payer: Medicare Other

## 2015-12-19 ENCOUNTER — Other Ambulatory Visit: Payer: Self-pay | Admitting: Family Medicine

## 2015-12-19 DIAGNOSIS — E1159 Type 2 diabetes mellitus with other circulatory complications: Secondary | ICD-10-CM

## 2015-12-19 DIAGNOSIS — E785 Hyperlipidemia, unspecified: Secondary | ICD-10-CM | POA: Diagnosis not present

## 2015-12-19 DIAGNOSIS — Z0184 Encounter for antibody response examination: Secondary | ICD-10-CM

## 2015-12-19 LAB — BASIC METABOLIC PANEL
BUN: 20 mg/dL (ref 6–23)
CO2: 32 mEq/L (ref 19–32)
Calcium: 10 mg/dL (ref 8.4–10.5)
Chloride: 101 mEq/L (ref 96–112)
Creatinine, Ser: 0.84 mg/dL (ref 0.40–1.50)
GFR: 97.14 mL/min (ref >60.00)
Glucose, Bld: 155 mg/dL — ABNORMAL HIGH (ref 70–99)
Potassium: 5 mEq/L (ref 3.5–5.1)
Sodium: 138 mEq/L (ref 135–145)

## 2015-12-19 LAB — HEMOGLOBIN A1C: Hgb A1c MFr Bld: 6.8 % — ABNORMAL HIGH (ref 4.6–6.5)

## 2015-12-20 LAB — HEPATITIS C ANTIBODY: HCV Ab: NEGATIVE

## 2015-12-26 ENCOUNTER — Ambulatory Visit (INDEPENDENT_AMBULATORY_CARE_PROVIDER_SITE_OTHER): Payer: Medicare Other | Admitting: Internal Medicine

## 2015-12-26 ENCOUNTER — Encounter: Payer: Self-pay | Admitting: Internal Medicine

## 2015-12-26 VITALS — BP 136/84 | Temp 98.2°F | Ht 73.0 in | Wt 330.4 lb

## 2015-12-26 DIAGNOSIS — Z79899 Other long term (current) drug therapy: Secondary | ICD-10-CM

## 2015-12-26 DIAGNOSIS — Z7901 Long term (current) use of anticoagulants: Secondary | ICD-10-CM | POA: Diagnosis not present

## 2015-12-26 DIAGNOSIS — E1159 Type 2 diabetes mellitus with other circulatory complications: Secondary | ICD-10-CM

## 2015-12-26 DIAGNOSIS — G479 Sleep disorder, unspecified: Secondary | ICD-10-CM

## 2015-12-26 NOTE — Patient Instructions (Signed)
Continue working on   [employer] and lifestyle.  Sugar is stable. Stay on the [place].  for now.   Wt Readings from Last 3 Encounters:  12/26/15 330 lb 6.4 oz (149.868 kg)  11/27/15 326 lb (147.873 kg)  10/06/15 325 lb 12.8 oz (147.782 kg)    Come back at your yearly wellness  May 17  With a1c ,

## 2015-12-26 NOTE — Progress Notes (Signed)
Pre visit review using our clinic review tool, if applicable. No additional management support is needed unless otherwise documented below in the visit note.  Chief Complaint  Patient presents with  . Follow-up    HPI: [name] [name]. 64 y.o.  comes in for chronic disease/ medication management   DM  Ok more  calories    In x mas   No vision sx   Sleep:  belsomra is ok.  Off ambien at this time [employer] insurance wont renew cpap equipment so needs to establish with new sleep doc  Has ppt coming up.  No inc swelling legs  Has had a residual cough no sob wheeze  ROS: See pertinent positives and negatives per HPI. Some back and right lat hip pain radiating no injury reminscent of when he had back surgery years ago with sx down left leg  Asks about  Next step no weakness reported   Past Medical History  Diagnosis Date  . HYPERLIPIDEMIA 03/22/2009  . Obesity, unspecified 04/24/2009  . THROMBOCYTOPENIA 08/16/2010  . ERECTILE DYSFUNCTION 03/22/2009  . SLEEP APNEA, OBSTRUCTIVE 03/22/2009    compliant with CPAP  . 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 Coronary atherosclerosis of native coronary artery 11/2002    a. s/p stent to LAD 12/03; OM2 occluded at cath 12/03; d. myoview 5/10: no ischemia;  e. echo 7/11: EF 55%, BAE, mild RVE, PASP 41-45; Myoview was in March 2013. There was no ischemia or infarction, EF 51%   . Atrial fibrillation ([place]) 03/22/2009    a. s/p multiple DCCV; b. no coumadin due to low TE risk profile; c. Tikosyn Rx  . GERD 03/22/2009  . LATERAL EPICONDYLITIS, LEFT 10/24/2009  . HYPERGLYCEMIA 04/25/2010  . LIVER FUNCTION TESTS, ABNORMAL 04/25/2010  . TOBACCO USE, QUIT 10/24/2009  . Umbilical hernia   . Myocardial infarction ([place]) mi2003  . Local reaction to immunization 05/05/2012    minor resolving  zostavax   . Numbness in left leg     foot related to back disease and surgery  . Diabetes mellitus without complication ([place])   . Renal cyst     Characterized by MRI as simple    . Iliac aneurysm ([facility])     2.6 to be evaluated incidental finding on CT  . Ruptured suppurative appendicitis     2015   . Drusen body     see opth note  . Perforated appendicitis with necrosis s/p open appendectomy 06/07/14 06/04/2014    Family History  Problem Relation Age of Onset  . Thyroid disease Mother   . Ovarian cancer Mother   . Breast cancer Mother   . Lung cancer Father   . Cancer Father     Social History   Social History  . Marital Status: Married    Spouse Name: N/A  . Number of Children: N/A  . Years of Education: N/A   Social History Main Topics  . Smoking status: Former Smoker -- 2.00 packs/day for 42 years    Types: Cigarettes    Quit date: 12/30/2008  . Smokeless tobacco: Never Used     Comment: started at age 75; 1-2 ppd; quit 2010  . Alcohol Use: No  . Drug Use: No  . Sexual Activity: Yes   Other Topics Concern  . None   Social History Narrative   Retired paramedic   Regular exercise-yes not recently    Has children   Wife is overweight and has fibromyalgia and depression on disability doesn't  go out much. Back surgery    Has older dog   Retired from [employer] 31 years of service now 7 years     Outpatient Prescriptions Prior to Visit  Medication Sig Dispense Refill  . albuterol (PROVENTIL HFA;VENTOLIN HFA) 108 (90 BASE) MCG/ACT inhaler Inhale 2 puffs into the lungs every 6 (six) hours as needed for wheezing or shortness of breath. 1 Inhaler 1  . atorvastatin (LIPITOR) 80 MG tablet Take 1 tablet by mouth  daily 90 tablet 3  . Blood Glucose Monitoring Suppl (ONETOUCH VERIO IQ SYSTEM) W/DEVICE KIT     . dofetilide (TIKOSYN) 500 MCG capsule Take 1 capsule (500 mcg total) by mouth 2 (two) times daily. 180 capsule 3  . famotidine (PEPCID) 20 MG tablet Take 10-20 mg by mouth daily as needed for heartburn or indigestion.     . fish oil-omega-3 fatty acids 1000 MG capsule Take 1 g by mouth 2 (two) times daily.     [name] MAGNESIUM OXIDE PO Take 750 mg by mouth  every evening.     . metFORMIN (GLUCOPHAGE-XR) 500 MG 24 hr tablet Take 3 tablets (1,500 mg total) by mouth daily with breakfast. 270 tablet 1  . MULTIPLE VITAMIN PO Take 1 tablet by mouth every evening.     . nitroGLYCERIN (NITROSTAT) 0.4 MG SL tablet Place 1 tablet (0.4 mg total) under the tongue every 5 (five) minutes as needed. 25 tablet 11  . ONETOUCH DELICA LANCETS 41R MISC USE AS DIRECTED 100 each 11  . ONETOUCH VERIO test strip USE TWICE A DAY 50 each 11  . pantoprazole (PROTONIX) 40 MG tablet TAKE 1 TABLET (40 MG TOTAL) BY MOUTH DAILY. 90 tablet 1  . potassium chloride SA (K-DUR,KLOR-CON) 20 MEQ tablet Take 1 tablet (20 mEq total) by mouth daily. 90 tablet 4  . PRADAXA 150 MG CAPS capsule Take 1 capsule by mouth  every 12 hours 180 capsule 1  . Suvorexant (BELSOMRA) 20 MG TABS Take 1 tablet by mouth at bedtime. 30 tablet 0   No facility-administered medications prior to visit.     EXAM:  BP 136/84 mmHg  Temp(Src) 98.2 F (36.8 C) (Oral)  Ht 6' 1"  (1.854 m)  Wt 330 lb 6.4 oz (149.868 kg)  BMI 43.60 kg/m2  Body mass index is 43.6 kg/(m^2).  GENERAL: vitals reviewed and listed above, alert, oriented, appears well hydrated and in no acute distress HEENT: atraumatic, conjunctiva  clear, no obvious abnormalities on inspection of external nose and ears  NECK: no obvious masses on inspection palpation  LUNGS: clear to auscultation bilaterally, no wheezes, rales or rhonchi, CV: HRRR, no clubbing cyanosis or trx 1 peripheral edema nl cap refill  MS: moves all extremities without noticeable focal  abnormality PSYCH: pleasant and cooperative, no obvious depression or anxiety Lab Results  Component Value Date   WBC 8.1 11/27/2015   HGB 16.3 11/27/2015   HCT 47.9 11/27/2015   PLT 144* 11/27/2015   GLUCOSE 155* 12/19/2015   CHOL 99 08/24/2015   TRIG 200.0* 08/24/2015   HDL 31.00* 08/24/2015   LDLCALC 28 08/24/2015   ALT 44 06/30/2014   AST 34 06/30/2014   NA 138 12/19/2015    K 5.0 12/19/2015   CL 101 12/19/2015   CREATININE 0.84 12/19/2015   BUN 20 12/19/2015   CO2 32 12/19/2015   TSH 1.96 06/30/2014   PSA 0.78 08/24/2015   INR 1.27 06/05/2014   HGBA1C 6.8* 12/19/2015   MICROALBUR 8.6* 08/31/2015  ASSESSMENT AND PLAN:  Discussed the following assessment and plan:  Type 2 diabetes mellitus with other circulatory complications ([place]) - controlled  counseled   Medication management  Sleep disorder - stay on belsomra at this time off ambienreviewed sleep hygiene  Chronic anticoagulation  Morbid obesity, unspecified obesity type ([place]) - weight going back up counseded wellness in may with a1c labs also Counseled. About weight control consider nutr referral or  Other   Disc seeing ns  Group  About back and leg although not alarm sx at this time -Patient advised to return or notify health care team  if symptoms worsen ,persist or new concerns arise.  Patient Instructions   Continue working on   [employer] and lifestyle.  Sugar is stable. Stay on the [place].  for now.   Wt Readings from Last 3 Encounters:  12/26/15 330 lb 6.4 oz (149.868 kg)  11/27/15 326 lb (147.873 kg)  10/06/15 325 lb 12.8 oz (147.782 kg)    Come back at your yearly wellness  May 17  With a1c ,    Standley Brooking. Chanc Kervin M.D.

## 2016-01-01 ENCOUNTER — Emergency Department (HOSPITAL_COMMUNITY): Payer: Medicare Other

## 2016-01-01 ENCOUNTER — Observation Stay (HOSPITAL_COMMUNITY)
Admission: EM | Admit: 2016-01-01 | Discharge: 2016-01-01 | Disposition: A | Discharge status: Home / Self Care | Payer: Medicare Other | Attending: Cardiology | Admitting: Cardiology

## 2016-01-01 ENCOUNTER — Encounter (HOSPITAL_COMMUNITY): Payer: Self-pay | Admitting: Family Medicine

## 2016-01-01 DIAGNOSIS — E669 Obesity, unspecified: Secondary | ICD-10-CM | POA: Diagnosis not present

## 2016-01-01 DIAGNOSIS — Z7901 Long term (current) use of anticoagulants: Secondary | ICD-10-CM

## 2016-01-01 DIAGNOSIS — I252 Old myocardial infarction: Secondary | ICD-10-CM | POA: Insufficient documentation

## 2016-01-01 DIAGNOSIS — I4891 Unspecified atrial fibrillation: Secondary | ICD-10-CM | POA: Diagnosis not present

## 2016-01-01 DIAGNOSIS — Z880 Allergy status to penicillin: Secondary | ICD-10-CM | POA: Diagnosis not present

## 2016-01-01 DIAGNOSIS — M7710 Lateral epicondylitis, unspecified elbow: Secondary | ICD-10-CM | POA: Insufficient documentation

## 2016-01-01 DIAGNOSIS — E785 Hyperlipidemia, unspecified: Secondary | ICD-10-CM | POA: Diagnosis not present

## 2016-01-01 DIAGNOSIS — Z87891 Personal history of nicotine dependence: Secondary | ICD-10-CM | POA: Diagnosis not present

## 2016-01-01 DIAGNOSIS — I2510 Atherosclerotic heart disease of native coronary artery without angina pectoris: Secondary | ICD-10-CM | POA: Insufficient documentation

## 2016-01-01 DIAGNOSIS — K352 Acute appendicitis with generalized peritonitis: Secondary | ICD-10-CM | POA: Diagnosis not present

## 2016-01-01 DIAGNOSIS — D696 Thrombocytopenia, unspecified: Secondary | ICD-10-CM | POA: Insufficient documentation

## 2016-01-01 DIAGNOSIS — K429 Umbilical hernia without obstruction or gangrene: Secondary | ICD-10-CM | POA: Insufficient documentation

## 2016-01-01 DIAGNOSIS — F528 Other sexual dysfunction not due to a substance or known physiological condition: Secondary | ICD-10-CM | POA: Diagnosis not present

## 2016-01-01 DIAGNOSIS — K219 Gastro-esophageal reflux disease without esophagitis: Secondary | ICD-10-CM | POA: Diagnosis present

## 2016-01-01 DIAGNOSIS — I480 Paroxysmal atrial fibrillation: Secondary | ICD-10-CM | POA: Diagnosis not present

## 2016-01-01 DIAGNOSIS — Z79899 Other long term (current) drug therapy: Secondary | ICD-10-CM | POA: Diagnosis not present

## 2016-01-01 DIAGNOSIS — H35369 Drusen (degenerative) of macula, unspecified eye: Secondary | ICD-10-CM | POA: Insufficient documentation

## 2016-01-01 DIAGNOSIS — E119 Type 2 diabetes mellitus without complications: Secondary | ICD-10-CM | POA: Diagnosis not present

## 2016-01-01 DIAGNOSIS — G4733 Obstructive sleep apnea (adult) (pediatric): Secondary | ICD-10-CM | POA: Diagnosis not present

## 2016-01-01 DIAGNOSIS — E1159 Type 2 diabetes mellitus with other circulatory complications: Secondary | ICD-10-CM | POA: Diagnosis present

## 2016-01-01 LAB — BASIC METABOLIC PANEL
Anion gap: 11 (ref 5–15)
BUN: 16 mg/dL (ref 6–20)
CO2: 28 mmol/L (ref 22–32)
Calcium: 9.6 mg/dL (ref 8.9–10.3)
Chloride: 101 mmol/L (ref 101–111)
Creatinine, Ser: 0.92 mg/dL (ref 0.61–1.24)
GFR calc Af Amer: >60 mL/min (ref >60)
GFR calc non Af Amer: >60 mL/min (ref >60)
Glucose, Bld: 175 mg/dL — ABNORMAL HIGH (ref 65–99)
Potassium: 4.2 mmol/L (ref 3.5–5.1)
Sodium: 140 mmol/L (ref 135–145)

## 2016-01-01 LAB — MAGNESIUM
Magnesium: 2 mg/dL (ref 1.7–2.4)
Magnesium: 2 mg/dL (ref 1.7–2.4)

## 2016-01-01 LAB — TROPONIN I: Troponin I: <0.03 ng/mL (ref <0.031)

## 2016-01-01 LAB — CBC
HCT: 48.5 % (ref 39.0–52.0)
Hemoglobin: 16.1 g/dL (ref 13.0–17.0)
MCH: 30.4 pg (ref 26.0–34.0)
MCHC: 33.2 g/dL (ref 30.0–36.0)
MCV: 91.7 fL (ref 78.0–100.0)
Platelets: 135 10*3/uL — ABNORMAL LOW (ref 150–400)
RBC: 5.29 MIL/uL (ref 4.22–5.81)
RDW: 13 % (ref 11.5–15.5)
WBC: 7.9 10*3/uL (ref 4.0–10.5)

## 2016-01-01 LAB — GLUCOSE, CAPILLARY
Glucose-Capillary: 130 mg/dL — ABNORMAL HIGH (ref 65–99)
Glucose-Capillary: 153 mg/dL — ABNORMAL HIGH (ref 65–99)

## 2016-01-01 MED ORDER — ETOMIDATE 2 MG/ML IV SOLN
INTRAVENOUS | Status: AC | PRN
  Administered 2016-01-01: 12 mg via INTRAVENOUS

## 2016-01-01 MED ORDER — METFORMIN HCL ER 500 MG PO TB24
1000.0000 mg | ORAL_TABLET | Freq: Every day | ORAL | Status: DC
  Administered 2016-01-01: 1000 mg via ORAL
  Filled 2016-01-01: qty 2

## 2016-01-01 MED ORDER — DABIGATRAN ETEXILATE MESYLATE 150 MG PO CAPS
150.0000 mg | ORAL_CAPSULE | Freq: Two times a day (BID) | ORAL | Status: DC
  Administered 2016-01-01: 150 mg via ORAL
  Filled 2016-01-01: qty 1

## 2016-01-01 MED ORDER — ALBUTEROL SULFATE HFA 108 (90 BASE) MCG/ACT IN AERS: 2.0000 | INHALATION_SPRAY | Freq: Four times a day (QID) | RESPIRATORY_TRACT | Status: DC | PRN

## 2016-01-01 MED ORDER — FAMOTIDINE 20 MG PO TABS: 10.0000 mg | ORAL_TABLET | Freq: Every day | ORAL | Status: DC | PRN

## 2016-01-01 MED ORDER — ATORVASTATIN CALCIUM 80 MG PO TABS
80.0000 mg | ORAL_TABLET | Freq: Every day | ORAL | Status: DC
  Administered 2016-01-01: 80 mg via ORAL
  Filled 2016-01-01: qty 1

## 2016-01-01 MED ORDER — ETOMIDATE 2 MG/ML IV SOLN: 10.0000 mg | Freq: Once | INTRAVENOUS | Status: DC

## 2016-01-01 MED ORDER — PANTOPRAZOLE SODIUM 40 MG PO TBEC: 40.0000 mg | DELAYED_RELEASE_TABLET | Freq: Every day | ORAL | Status: DC

## 2016-01-01 MED ORDER — METFORMIN HCL ER 500 MG PO TB24: 500.0000 mg | ORAL_TABLET | Freq: Two times a day (BID) | ORAL | Status: DC

## 2016-01-01 MED ORDER — ETOMIDATE 2 MG/ML IV SOLN
12.0000 mg | Freq: Once | INTRAVENOUS | Status: DC
  Filled 2016-01-01: qty 10

## 2016-01-01 MED ORDER — METFORMIN HCL ER 750 MG PO TB24
1500.0000 mg | ORAL_TABLET | Freq: Every day | ORAL | Status: DC
  Filled 2016-01-01 (×2): qty 2

## 2016-01-01 MED ORDER — OMEGA-3 FATTY ACIDS 1000 MG PO CAPS: 1.0000 g | ORAL_CAPSULE | Freq: Two times a day (BID) | ORAL | Status: DC

## 2016-01-01 MED ORDER — DOFETILIDE 500 MCG PO CAPS
500.0000 ug | ORAL_CAPSULE | Freq: Once | ORAL | Status: AC
  Administered 2016-01-01: 500 ug via ORAL
  Filled 2016-01-01: qty 1

## 2016-01-01 MED ORDER — ALBUTEROL SULFATE (2.5 MG/3ML) 0.083% IN NEBU: 2.5000 mg | INHALATION_SOLUTION | Freq: Four times a day (QID) | RESPIRATORY_TRACT | Status: DC | PRN

## 2016-01-01 MED ORDER — MAGNESIUM OXIDE 400 (241.3 MG) MG PO TABS
750.0000 mg | ORAL_TABLET | Freq: Every evening | ORAL | Status: DC
  Administered 2016-01-01: 800 mg via ORAL
  Filled 2016-01-01: qty 2

## 2016-01-01 MED ORDER — POTASSIUM CHLORIDE CRYS ER 20 MEQ PO TBCR: 20.0000 meq | EXTENDED_RELEASE_TABLET | Freq: Every day | ORAL | Status: DC

## 2016-01-01 NOTE — ED Notes (Signed)
Attempted to call report-- nurse on [facility] will call back

## 2016-01-01 NOTE — ED Notes (Signed)
Pt here for afib sts started at 9:15 last night. sts hx of the same. Denies chest pain. sts some SOB with exertion.

## 2016-01-01 NOTE — Sedation Documentation (Signed)
Family at bedside. 

## 2016-01-01 NOTE — ED Notes (Signed)
Per Dr Wilson Singer, patient can take his morning medications.

## 2016-01-01 NOTE — ED Notes (Signed)
Returned from xray

## 2016-01-01 NOTE — ED Notes (Signed)
Nurse from [facility] called, stated ok to bring pt. Updated on current VS.

## 2016-01-01 NOTE — Discharge Instructions (Signed)
Atrial Fibrillation °Atrial fibrillation is a type of irregular or rapid heartbeat (arrhythmia). In atrial fibrillation, the heart quivers continuously in a chaotic pattern. This occurs when parts of the heart receive disorganized signals that make the heart unable to pump blood normally. This can increase the risk for stroke, heart failure, and other heart-related conditions. There are different types of atrial fibrillation, including: °· Paroxysmal atrial fibrillation. This type starts suddenly, and it usually stops on its own shortly after it starts. °· Persistent atrial fibrillation. This type often lasts longer than a week. It may stop on its own or with treatment. °· Long-lasting persistent atrial fibrillation. This type lasts longer than 12 months. °· Permanent atrial fibrillation. This type does not go away. °Talk with your health care provider to learn about the type of atrial fibrillation that you have. °CAUSES °This condition is caused by some heart-related conditions or procedures, including: °· A heart attack. °· Coronary artery disease. °· Heart failure. °· Heart valve conditions. °· High blood pressure. °· Inflammation of the sac that surrounds the heart (pericarditis). °· Heart surgery. °· Certain heart rhythm disorders, such as Wolf-Parkinson-White syndrome. °Other causes include: °· Pneumonia. °· Obstructive sleep apnea. °· Blockage of an artery in the lungs (pulmonary embolism, or PE). °· Lung cancer. °· Chronic lung disease. °· Thyroid problems, especially if the thyroid is overactive (hyperthyroidism). °· Caffeine. °· Excessive alcohol use or illegal drug use. °· Use of some medicines, including certain decongestants and diet pills. °Sometimes, the cause cannot be found. °RISK FACTORS °This condition is more likely to develop in: °· People who are older in age. °· People who smoke. °· People who have diabetes mellitus. °· People who are overweight (obese). °· Athletes who exercise  vigorously. °SYMPTOMS °Symptoms of this condition include: °· A feeling that your heart is beating rapidly or irregularly. °· A feeling of discomfort or pain in your chest. °· Shortness of breath. °· Sudden light-headedness or weakness. °· Getting tired easily during exercise. °In some cases, there are no symptoms. °DIAGNOSIS °Your health care provider may be able to detect atrial fibrillation when taking your pulse. If detected, this condition may be diagnosed with: °· An electrocardiogram (ECG). °· A Holter monitor test that records your heartbeat patterns over a 24-hour period. °· Transthoracic echocardiogram (TTE) to evaluate how blood flows through your heart. °· Transesophageal echocardiogram (TEE) to view more detailed images of your heart. °· A stress test. °· Imaging tests, such as a CT scan or chest X-ray. °· Blood tests. °TREATMENT °The main goals of treatment are to prevent blood clots from forming and to keep your heart beating at a normal rate and rhythm. The type of treatment that you receive depends on many factors, such as your underlying medical conditions and how you feel when you are experiencing atrial fibrillation. °This condition may be treated with: °· Medicine to slow down the heart rate, bring the heart's rhythm back to normal, or prevent clots from forming. °· Electrical cardioversion. This is a procedure that resets your heart's rhythm by delivering a controlled, low-energy shock to the heart through your skin. °· Different types of ablation, such as catheter ablation, catheter ablation with pacemaker, or surgical ablation. These procedures destroy the heart tissues that send abnormal signals. When the pacemaker is used, it is placed under your skin to help your heart beat in a regular rhythm. °HOME CARE INSTRUCTIONS °· Take over-the counter and prescription medicines only as told by your health care provider. °·   If your health care provider prescribed a blood-thinning medicine  (anticoagulant), take it exactly as told. Taking too much blood-thinning medicine can cause bleeding. If you do not take enough blood-thinning medicine, you will not have the protection that you need against stroke and other problems.  Do not use tobacco products, including cigarettes, chewing tobacco, and e-cigarettes. If you need help quitting, ask your health care provider.  If you have obstructive sleep apnea, manage your condition as told by your health care provider.  Do not drink alcohol.  Do not drink beverages that contain caffeine, such as coffee, soda, and tea.  Maintain a healthy weight. Do not use diet pills unless your health care provider approves. Diet pills may make heart problems worse.  Follow diet instructions as told by your health care provider.  Exercise regularly as told by your health care provider.  Keep all follow-up visits as told by your health care provider. This is important. PREVENTION  Avoid drinking beverages that contain caffeine or alcohol.  Avoid certain medicines, especially medicines that are used for breathing problems.  Avoid certain herbs and herbal medicines, such as those that contain ephedra or ginseng.  Do not use illegal drugs, such as cocaine and amphetamines.  Do not smoke.  Manage your high blood pressure. SEEK MEDICAL CARE IF:  You notice a change in the rate, rhythm, or strength of your heartbeat.  You are taking an anticoagulant and you notice increased bruising.  You tire more easily when you exercise or exert yourself. SEEK IMMEDIATE MEDICAL CARE IF:  You have chest pain, abdominal pain, sweating, or weakness.  You feel nauseous.  You notice blood in your vomit, bowel movement, or urine.  You have shortness of breath.  You suddenly have swollen feet and ankles.  You feel dizzy.  You have sudden weakness or numbness of the face, arm, or leg, especially on one side of the body.  You have trouble speaking,  trouble understanding, or both (aphasia).  Your face or your eyelid droops on one side. These symptoms may represent a serious problem that is an emergency. Do not wait to see if the symptoms will go away. Get medical help right away. Call your local emergency services (911 in the [place] Do not drive yourself to the hospital.   This information is not intended to replace advice given to you by your health care provider. Make sure you discuss any questions you have with your health care provider.   Document Released: 12/16/2005 Document Revised: 09/06/2015 Document Reviewed: 04/12/2015 Elsevier Interactive Patient Education 2016 [employer]. Electrical Burn  Some electrical burns only hurt the skin. Other electrical burns go deep into the body. Deep burns can hurt the muscles, nerves, heart, and other organs. You may need to stay in the hospital. HOME CARE  Keep the burn clean and dry.  Use bandages (dressings) and creams as told by your doctor. Change your bandage 1 or 2 times a day or as told by your doctor.  Rest the burn area. Keep the burn raised (elevated). This helps lessen puffiness (swelling) and pain.  Take pain medicine as told by your doctor. GET HELP RIGHT AWAY IF:  You have a fever.  There is redness or yellowish-white fluid (pus) coming from the burn.  You have chest pain or feel short of breath.  You feel weak or lose feeling (numbness) in your arms or legs.  You have pain and puffiness in your arms or legs.  Your pee (urine)  is very dark, you feel very tired (fatigue), or you have muscle cramps. MAKE SURE YOU:  Understand these instructions.  Will watch your condition.  Will get help right away if you are not doing well or get worse.   This information is not intended to replace advice given to you by your health care provider. Make sure you discuss any questions you have with your health care provider.   Document Released: 08/28/2011 Document Revised:  03/09/2012 Document Reviewed: 08/28/2011 Elsevier Interactive Patient Education [employer].

## 2016-01-01 NOTE — H&P (Addendum)
Patient ID: [name] [name]. MRN: [id number], DOB/AGE: 67-Jan-1952   Admit date: 01/01/2016   Primary Physician: Lottie Dawson, MD Primary Cardiologist: Dr Stanford Breed  Pt. Profile:  Paroxysmal a-fib  Problem List  Past Medical History  Diagnosis Date  . HYPERLIPIDEMIA 03/22/2009  . Obesity, unspecified 04/24/2009  . THROMBOCYTOPENIA 08/16/2010  . ERECTILE DYSFUNCTION 03/22/2009  . SLEEP APNEA, OBSTRUCTIVE 03/22/2009    compliant with CPAP  . 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 Coronary atherosclerosis of native coronary artery 11/2002    a. s/p stent to LAD 12/03; OM2 occluded at cath 12/03; d. myoview 5/10: no ischemia;  e. echo 7/11: EF 55%, BAE, mild RVE, PASP 41-45; Myoview was in March 2013. There was no ischemia or infarction, EF 51%   . Atrial fibrillation ([place]) 03/22/2009    a. s/p multiple DCCV; b. no coumadin due to low TE risk profile; c. Tikosyn Rx  . GERD 03/22/2009  . LATERAL EPICONDYLITIS, LEFT 10/24/2009  . HYPERGLYCEMIA 04/25/2010  . LIVER FUNCTION TESTS, ABNORMAL 04/25/2010  . TOBACCO USE, QUIT 10/24/2009  . Umbilical hernia   . Myocardial infarction ([name]) mi2003  . Local reaction to immunization 05/05/2012    minor resolving  zostavax   . Numbness in left leg     foot related to back disease and surgery  . Diabetes mellitus without complication ([place])   . Renal cyst     Characterized by MRI as simple  . Iliac aneurysm ([facility])     2.6 to be evaluated incidental finding on CT  . Ruptured suppurative appendicitis     2015   . Drusen body     see opth note  . Perforated appendicitis with necrosis s/p open appendectomy 06/07/14 06/04/2014    Past Surgical History  Procedure Laterality Date  . Cyst on epiglottis  08/2002  . Surgery l4-l5   1998    ruptured x 3  . Stent      LAD DUS 2004  . Ulnar neuropathy    . Umbilical hernia repair      mesh  . Lumbar disc surgery      two  holes in spinalcovering  . Esophagogastroduodenoscopy  11/15/2011   Procedure: ESOPHAGOGASTRODUODENOSCOPY (EGD);  Surgeon: Juanita Craver, MD;  Location: [facility];  Service: Endoscopy;  Laterality: N/A;  . Colonoscopy  11/15/2011    Procedure: COLONOSCOPY;  Surgeon: Juanita Craver, MD;  Location: [facility];  Service: Endoscopy;  Laterality: N/A;  . Laparoscopic appendectomy N/A 06/06/2014    Procedure: APPENDECTOMY LAPAROSCOPIC attemted;  Surgeon: Odis Hollingshead, MD;  Location: [facility];  Service: General;  Laterality: N/A;  . Appendectomy N/A 06/06/2014    Procedure: APPENDECTOMY;  Surgeon: Odis Hollingshead, MD;  Location: [facility];  Service: General;  Laterality: N/A;  . Colon surgery       Allergies  Allergies  Allergen Reactions  . Penicillins     Childhood reaction   . Procaine Hcl Hives and Other (See Comments)    Red in the face and neck   . Pseudoephedrine Other (See Comments)    Patient went into afib  . Sulfonamide Derivatives     Childhood reaction   . Cardizem [Diltiazem] Rash    HPI  Patient is a 65 y.o. male with a PMHx of CAD s/p stent to LAD in 2003, parox AFib on tikosyn, HL, OSA , he is being followed by Dr Stanford Breed, the last on 11/27/2015 when he was doing well. In the past he  underwent DCCV in 05/2012 for AFib, he has seen Dr. Thompson Grayer and ablation could be considered if he lost weight. Patient seen in emergency room in August with recurrent atrial fibrillation. He was given Cardizem but developed a rash. He underwent successful cardioversion.Continued on tikosyn. Since last seen, the patient has dyspnea with more extreme activities but not with routine activities. It is relieved with rest. It is not associated with chest pain. There is no orthopnea, PND or pedal edema. There is no syncope or palpitations. There is no exertional chest pain. He developed palpitations at 10 pm the last night and came to the ER this am. ECG showed a-fib and he was cardioverted by the ER physician back to [place].   Home Medications  Prior to Admission  medications   Medication Sig Start Date End Date Taking? Authorizing Provider  acetaminophen (TYLENOL) 325 MG tablet Take 650 mg by mouth every 6 (six) hours as needed for mild pain.   Yes Historical Provider, MD  albuterol (PROVENTIL HFA;VENTOLIN HFA) 108 (90 BASE) MCG/ACT inhaler Inhale 2 puffs into the lungs every 6 (six) hours as needed for wheezing or shortness of breath. 10/06/15  Yes Burnis Medin, MD  atorvastatin (LIPITOR) 80 MG tablet Take 1 tablet by mouth  daily 05/26/15  Yes Burnis Medin, MD  Blood Glucose Monitoring Suppl (ONETOUCH VERIO IQ SYSTEM) W/DEVICE KIT  03/22/14  Yes Historical Provider, MD  diphenhydrAMINE (SOMINEX) 25 MG tablet Take 25 mg by mouth at bedtime as needed for sleep.   Yes Historical Provider, MD  dofetilide (TIKOSYN) 500 MCG capsule Take 1 capsule (500 mcg total) by mouth 2 (two) times daily. 02/22/15  Yes Lelon Perla, MD  famotidine (PEPCID) 20 MG tablet Take 10-20 mg by mouth daily as needed for heartburn or indigestion.    Yes Historical Provider, MD  fish oil-omega-3 fatty acids 1000 MG capsule Take 1 g by mouth 2 (two) times daily.    Yes Historical Provider, MD  MAGNESIUM OXIDE PO Take 750 mg by mouth every evening.    Yes Historical Provider, MD  metFORMIN (GLUCOPHAGE-XR) 500 MG 24 hr tablet Take 3 tablets (1,500 mg total) by mouth daily with breakfast. Patient taking differently: Take 500-1,000 mg by mouth 2 (two) times daily. Pt takes 568m in the am and 10018mat lunchtime 12/11/15  Yes WaBurnis MedinMD  MULTIPLE VITAMIN PO Take 1 tablet by mouth every evening.    Yes Historical Provider, MD  nitroGLYCERIN (NITROSTAT) 0.4 MG SL tablet Place 1 tablet (0.4 mg total) under the tongue every 5 (five) minutes as needed. 05/20/14 01/28/17 Yes Scott T Weaver, PA-C  ONETOUCH DELICA LANCETS 3378IISC USE AS DIRECTED 03/14/15  Yes WaBurnis MedinMD  [facility] test strip USE TWICE A DAY 11/01/15  Yes WaBurnis MedinMD  pantoprazole (PROTONIX) 40 MG tablet  TAKE 1 TABLET (40 MG TOTAL) BY MOUTH DAILY. 10/09/15  Yes WaBurnis MedinMD  potassium chloride SA (K-DUR,KLOR-CON) 20 MEQ tablet Take 1 tablet (20 mEq total) by mouth daily. 01/27/15  Yes BrLelon PerlaMD  PRADAXA 150 MG CAPS capsule Take 1 capsule by mouth  every 12 hours 12/06/15  Yes WaBurnis MedinMD  Suvorexant (BELSOMRA) 20 MG TABS Take 1 tablet by mouth at bedtime. 09/22/15  Yes WaBurnis MedinMD    Family History  Family History  Problem Relation Age of Onset  . Thyroid disease Mother   . Ovarian cancer Mother   .  Breast cancer Mother   . Lung cancer Father   . Cancer Father     Social History  Social History   Social History  . Marital Status: Married    Spouse Name: N/A  . Number of Children: N/A  . Years of Education: N/A   Occupational History  . Not on file.   Social History Main Topics  . Smoking status: Former Smoker -- 2.00 packs/day for 42 years    Types: Cigarettes    Quit date: 12/30/2008  . Smokeless tobacco: Never Used     Comment: started at age 48; 1-2 ppd; quit 2010  . Alcohol Use: No  . Drug Use: No  . Sexual Activity: Yes   Other Topics Concern  . Not on file   Social History Narrative   Retired paramedic   Regular exercise-yes not recently    Has children   Wife is overweight and has fibromyalgia and depression on disability doesn't go out much. Back surgery    Has older dog   Retired from [employer] years of service now 7 years      Review of Systems General:  No chills, fever, night sweats or weight changes.  Cardiovascular:  No chest pain, dyspnea on exertion, edema, orthopnea, palpitations, paroxysmal nocturnal dyspnea. Dermatological: No rash, lesions/masses Respiratory: No cough, dyspnea Urologic: No hematuria, dysuria Abdominal:   No nausea, vomiting, diarrhea, bright red blood per rectum, melena, or hematemesis Neurologic:  No visual changes, wkns, changes in mental status. All other systems reviewed and are otherwise  negative except as noted above.  Physical Exam  Blood pressure 108/65, pulse 58, temperature 98.2 F (36.8 C), temperature source Oral, resp. rate 17, SpO2 95 %.  General: Pleasant, NAD Psych: Normal affect. Neuro: Alert and oriented X 3. Moves all extremities spontaneously. HEENT: Normal  Neck: Supple without bruits or JVD. Lungs:  Resp regular and unlabored, CTA. Heart: RRR no s3, s4, or murmurs. Abdomen: Soft, non-tender, non-distended, BS + x 4.  Extremities: No clubbing, cyanosis or edema. DP/PT/Radials 2+ and equal bilaterally.  Labs  Recent Labs  01/01/16 0810  TROPONINI <0.03   Lab Results  Component Value Date   WBC 7.9 01/01/2016   HGB 16.1 01/01/2016   HCT 48.5 01/01/2016   MCV 91.7 01/01/2016   PLT 135* 01/01/2016    Recent Labs Lab 01/01/16 0810  NA 140  K 4.2  CL 101  CO2 28  BUN 16  CREATININE 0.92  CALCIUM 9.6  GLUCOSE 175*   Lab Results  Component Value Date   CHOL 99 08/24/2015   HDL 31.00* 08/24/2015   LDLCALC 28 08/24/2015   TRIG 200.0* 08/24/2015   Radiology/Studies  Dg Chest 2 View  01/01/2016  CLINICAL DATA:  Atrial fibrillation and shortness of breath. EXAM: CHEST - 2 VIEW COMPARISON:  08/08/2015 FINDINGS: The heart size is at the upper limits of normal. Diffuse bilateral pulmonary interstitial prominence appears to be largely chronic and reflective chronic lung disease. A component of mild chronic interstitial edema could certainly be present. No overt airspace edema or pleural fluid is identified. No airspace consolidation, significant hyperinflation, pneumothorax or nodule identified. The visualized skeletal structures are unremarkable. IMPRESSION: Largely chronic pulmonary interstitial prominence compared to prior chest x-rays is consistent with chronic disease. There may be a component of mild chronic interstitial edema. No overt airspace edema or pleural fluid is identified. Electronically Signed   By: Aletta Edouard M.D.   On:  01/01/2016 09:04   Echocardiogram -  07/22/2010  Left ventricle: The cavity size was normal. Wall thickness was  normal. Systolic function was normal. The estimated ejection  fraction was 55%. Images were inadequate for LV wall motion  assessment. Left ventricular diastolic function parameters were  normal. - Aortic valve: There was no stenosis. - Aorta: Mildly dilated aortic root. - Mitral valve: Mildly calcified annulus. - Left atrium: The atrium was mildly dilated. - Right ventricle: The cavity size was mildly dilated. Systolic  function was normal. - Right atrium: The atrium was mildly dilated. - Tricuspid valve: Peak RV-RA gradient: 35m Hg (S). - Pulmonary arteries: PA systolic pressure 481-01mmHg. - Systemic veins: The IVC measured 2.5 cm with some respirophasic  variation, estimated PA systolic pressure 175-10mmHg.  Impressions: - Technically difficult study with poor acoustic windows. EF  estimated to be about 55%. No definite wall motion abnormalities  but not all wall segments were well-visualized. The diastolic  function appeared normal. No significant valvular dysfunction. The  RV was mildly dilated with normal systolic function. Mild  pulmonary hypertension and dilated RA. ? effects from OSA.  ECG: SR, QT/QTC 417/417 ms    ASSESSMENT AND PLAN  1. Paroxysmal a-fib, s/p DCCV in the ER, we will admit for 12 hour observation as he is on Tikosyn to moniotr for possible torsades.  K >4, Mg 2.   If he he remains in SR with no VT we will discharge later today. Continue Pradaxa for anticoagulation.   2. CAD, stable, asymptomatic, troponin negative  3. Hyperlipidemia - continue atorvastatin  We will arrange for an outpatient follow up in 2 weeks.   Signed, NDorothy Spark MD, [facility]1/01/2016, 12:46 PM

## 2016-01-01 NOTE — ED Provider Notes (Addendum)
CSN: [id number]     Arrival date & time 01/01/16  0749 History   First MD Initiated Contact with Patient 01/01/16 0800     Chief Complaint  Patient presents with  . Atrial Fibrillation     (Consider location/radiation/quality/duration/timing/severity/associated sxs/prior Treatment) HPI   64-year-old male with palpitations. He has a past history of atrial fibrillation. Current symptoms feel similar to that. Acute onset of symptoms around 9:15 PM last night. Persistent since then. Hx of CAD s/p stening of LAD in 2003. Denies any other additional acute complaints. No pain. No dyspnea. No nausea or diaphoresis. He is on tikosyn and pradaxa. He reports compliance with his medications. Has previously been cardioverted. Most recently in August. Reports he's been doing well since that time up until yesterday. Cardiologist is Dr. Crenshaw.  Past Medical History  Diagnosis Date  . HYPERLIPIDEMIA 03/22/2009  . Obesity, unspecified 04/24/2009  . THROMBOCYTOPENIA 08/16/2010  . ERECTILE DYSFUNCTION 03/22/2009  . SLEEP APNEA, OBSTRUCTIVE 03/22/2009    compliant with CPAP  . 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 Coronary atherosclerosis of native coronary artery 11/2002    a. s/p stent to LAD 12/03; OM2 occluded at cath 12/03; d. myoview 5/10: no ischemia;  e. echo 7/11: EF 55%, BAE, mild RVE, PASP 41-45; Myoview was in March 2013. There was no ischemia or infarction, EF 51%   . Atrial fibrillation ([facility]) 03/22/2009    a. s/p multiple DCCV; b. no coumadin due to low TE risk profile; c. Tikosyn Rx  . GERD 03/22/2009  . LATERAL EPICONDYLITIS, LEFT 10/24/2009  . HYPERGLYCEMIA 04/25/2010  . LIVER FUNCTION TESTS, ABNORMAL 04/25/2010  . TOBACCO USE, QUIT 10/24/2009  . Umbilical hernia   . Myocardial infarction ([facility]) mi2003  . Local reaction to immunization 05/05/2012    minor resolving  zostavax   . Numbness in left leg     foot related to back disease and surgery  . Diabetes mellitus without complication ([facility])    . Renal cyst     Characterized by MRI as simple  . Iliac aneurysm ([facility])     2.6 to be evaluated incidental finding on CT  . Ruptured suppurative appendicitis     2015   . Drusen body     see opth note  . Perforated appendicitis with necrosis s/p open appendectomy 06/07/14 06/04/2014   Past Surgical History  Procedure Laterality Date  . Cyst on epiglottis  08/2002  . Surgery l4-l5   1998    ruptured x 3  . Stent      LAD DUS 2004  . Ulnar neuropathy    . Umbilical hernia repair      mesh  . Lumbar disc surgery      two  holes in spinalcovering  . Esophagogastroduodenoscopy  11/15/2011    Procedure: ESOPHAGOGASTRODUODENOSCOPY (EGD);  Surgeon: Jyothi Mann, MD;  [facility];  Service: Endoscopy;  Laterality: N/A;  . Colonoscopy  11/15/2011    Procedure: COLONOSCOPY;  Surgeon: Jyothi Mann, MD;  [facility];  Service: Endoscopy;  Laterality: N/A;  . Laparoscopic appendectomy N/A 06/06/2014    Procedure: APPENDECTOMY LAPAROSCOPIC attemted;  Surgeon: Todd J Rosenbower, MD;  Location: [facility];  Service: General;  Laterality: N/A;  . Appendectomy N/A 06/06/2014    Procedure: APPENDECTOMY;  Surgeon: Todd J Rosenbower, MD;  Location: [facility];  Service: General;  Laterality: N/A;  . Colon surgery     Family History  Problem Relation Age of Onset  . Thyroid disease Mother   .   Ovarian cancer Mother   . Breast cancer Mother   . Lung cancer Father   . Cancer Father    Social History  Substance Use Topics  . Smoking status: Former Smoker -- 2.00 packs/day for 42 years    Types: Cigarettes    Quit date: 12/30/2008  . Smokeless tobacco: Never Used     Comment: started at age 19; 1-2 ppd; quit 2010  . Alcohol Use: No    Review of Systems  All systems reviewed and negative, other than as noted in HPI.   Allergies  Penicillins; Procaine hcl; Pseudoephedrine; Sulfonamide derivatives; and Cardizem  Home Medications   Prior to Admission medications   Medication Sig  Start Date End Date Taking? Authorizing Provider  albuterol (PROVENTIL HFA;VENTOLIN HFA) 108 (90 BASE) MCG/ACT inhaler Inhale 2 puffs into the lungs every 6 (six) hours as needed for wheezing or shortness of breath. 10/06/15   Burnis Medin, MD  atorvastatin (LIPITOR) 80 MG tablet Take 1 tablet by mouth  daily 05/26/15   Burnis Medin, MD  Blood Glucose Monitoring Suppl (ONETOUCH VERIO IQ SYSTEM) W/DEVICE KIT  03/22/14   Historical Provider, MD  dofetilide (TIKOSYN) 500 MCG capsule Take 1 capsule (500 mcg total) by mouth 2 (two) times daily. 02/22/15   Lelon Perla, MD  famotidine (PEPCID) 20 MG tablet Take 10-20 mg by mouth daily as needed for heartburn or indigestion.     Historical Provider, MD  fish oil-omega-3 fatty acids 1000 MG capsule Take 1 g by mouth 2 (two) times daily.     Historical Provider, MD  MAGNESIUM OXIDE PO Take 750 mg by mouth every evening.     Historical Provider, MD  metFORMIN (GLUCOPHAGE-XR) 500 MG 24 hr tablet Take 3 tablets (1,500 mg total) by mouth daily with breakfast. 12/11/15   Burnis Medin, MD  MULTIPLE VITAMIN PO Take 1 tablet by mouth every evening.     Historical Provider, MD  nitroGLYCERIN (NITROSTAT) 0.4 MG SL tablet Place 1 tablet (0.4 mg total) under the tongue every 5 (five) minutes as needed. 05/20/14 01/28/17  Liliane Shi, PA-C  ONETOUCH DELICA LANCETS 96Q MISC USE AS DIRECTED 03/14/15   Burnis Medin, MD  [facility] VERIO test strip USE TWICE A DAY 11/01/15   Burnis Medin, MD  pantoprazole (PROTONIX) 40 MG tablet TAKE 1 TABLET (40 MG TOTAL) BY MOUTH DAILY. 10/09/15   Burnis Medin, MD  potassium chloride SA (K-DUR,KLOR-CON) 20 MEQ tablet Take 1 tablet (20 mEq total) by mouth daily. 01/27/15   Lelon Perla, MD  PRADAXA 150 MG CAPS capsule Take 1 capsule by mouth  every 12 hours 12/06/15   Burnis Medin, MD  Suvorexant (BELSOMRA) 20 MG TABS Take 1 tablet by mouth at bedtime. 09/22/15   Burnis Medin, MD   BP 117/86 mmHg  Pulse 107  Temp(Src) 98.3  F (36.8 C) (Oral)  Resp 14  SpO2 95% Physical Exam  Constitutional: He appears well-developed and well-nourished. No distress.  Laying in bed. No acute distress. Obese.  HENT:  Head: Normocephalic and atraumatic.  Eyes: Conjunctivae are normal. Right eye exhibits no discharge. Left eye exhibits no discharge.  Neck: Neck supple.  Cardiovascular: Normal heart sounds.  Exam reveals no gallop and no friction rub.   No murmur heard. Irregularly irregular.  Pulmonary/Chest: Effort normal and breath sounds normal. No respiratory distress.  Abdominal: Soft. He exhibits no distension. There is no tenderness.  Musculoskeletal: He exhibits no  edema or tenderness.  Neurological: He is alert.  Skin: Skin is warm and dry.  Psychiatric: He has a normal mood and affect. His behavior is normal. Thought content normal.  Nursing note and vitals reviewed.   ED Course  .Cardioversion Date/Time: 01/01/2016 10:40 AM Performed by: ,  Authorized by: ,  Consent: Verbal consent obtained. Written consent obtained. Risks and benefits: risks, benefits and alternatives were discussed Consent given by: patient Required items: required blood products, implants, devices, and special equipment available Time out: Immediately prior to procedure a "time out" was called to verify the correct patient, procedure, equipment, support staff and site/side marked as required. Patient sedated: yes Sedation type: moderate (conscious) sedation Sedatives: etomidate Cardioversion basis: elective Pre-procedure rhythm: atrial fibrillation Patient position: patient was placed in a supine position Chest area: chest area exposed Electrodes: pads Electrodes placed: anterior-posterior Number of attempts: 1 Attempt 1 mode: synchronous Attempt 1 waveform: biphasic Attempt 1 shock (in Joules): 200 Attempt 1 outcome: conversion to normal sinus rhythm Post-procedure rhythm: normal sinus  rhythm Complications: no complications Patient tolerance: Patient tolerated the procedure well with no immediate complications   Procedural Sedation:  Preprocedure  Pre-anesthesia/induction confirmation of laterality/correct procedure site including "time-out."  Provider confirms review of the nurses' note, allergies, medications, pertinent labs, PMH, pre-induction vital signs, pulse oximetry, pain level, and ECG (as applicable), and patient condition satisfactory for commencing with order for sedation and procedure.  Medications: etomidate 12 mg IV  Patient tolerated procedure and procedural sedation component as expected without apparent immediate complications.  Physician confirms procedural medication orders as administered, patient was assessed by physician post-procedure, and confirms post-sedation plan of care and disposition.  Total time of sedation/monitoring: 30 minutes     (including critical care time) Labs Review Labs Reviewed  BASIC METABOLIC PANEL - Abnormal; Notable for the following:    Glucose, Bld 175 (*)    All other components within normal limits  CBC - Abnormal; Notable for the following:    Platelets 135 (*)    All other components within normal limits  TROPONIN I  MAGNESIUM  MAGNESIUM    Imaging Review Dg Chest 2 View  01/01/2016  CLINICAL DATA:  Atrial fibrillation and shortness of breath. EXAM: CHEST - 2 VIEW COMPARISON:  08/08/2015 FINDINGS: The heart size is at the upper limits of normal. Diffuse bilateral pulmonary interstitial prominence appears to be largely chronic and reflective chronic lung disease. A component of mild chronic interstitial edema could certainly be present. No overt airspace edema or pleural fluid is identified. No airspace consolidation, significant hyperinflation, pneumothorax or nodule identified. The visualized skeletal structures are unremarkable. IMPRESSION: Largely chronic pulmonary interstitial prominence compared to prior  chest x-rays is consistent with chronic disease. There may be a component of mild chronic interstitial edema. No overt airspace edema or pleural fluid is identified. Electronically Signed   By: Glenn  Yamagata M.D.   On: 01/01/2016 09:04   I have personally reviewed and evaluated these images and lab results as part of my medical decision-making.   EKG Interpretation   Date/Time:  Monday January 01 2016 08:00:35 EST Ventricular Rate:  115 PR Interval:    QRS Duration: 97 QT Interval:  344 QTC Calculation: 476 R Axis:   136 Text Interpretation:  Atrial fibrillation Low voltage, precordial leads  RSR' in V1 or V2, right VCD or RVH Confirmed by   MD,  ([id number])  on 01/01/2016 8:16:00 AM      MDM   Final diagnoses:    Atrial fibrillation, unspecified type (HCC)     64-year-old male with atrial fibrillation on tikosyn and pradaxa. Developed rash previously with cardizem. Previous cardioversions, most recently in August. Gives a clear history of acute onset of symptoms around 9:15 PM last night. Persistent since then. He is in A. fib currently with a rate of 100-110. QTc: 476 ms. He is symptomatic in terms of feeling he is in afib, but denies other associated symptoms. Suspect he may be cardioverted in the ED. Will keep nothing by mouth for now. Will discuss with cardiology. Happy to assist with sedation if needed.  Discussed with cardiology. Requesting cardioversion in the ED and admission for 12 hours after with concurrent tikosyn use. Will convert and admit for observation.   Converted to sinus. Will send to floor for observation once awake from sedation.   11:35 AM Pt evaluated by cardiology in ED. Recommending 12 hour observation from time of cardioversion. Apparently increased risk for Torsades while on tikosyn. Pt unhappy about this, but agreeable. Limited hospital beds currently. Will transfer to Pod C to be observed on monitor. Home meds ordered. Plan to DC this evening  around 11pm assuming no further issues. Outpt FU with Dr Crenshaw.      , MD 01/01/16 1148 

## 2016-01-01 NOTE — Discharge Summary (Signed)
Patient ID: [name] [name].,  MRN: [id number], DOB/AGE: [birth year] 65 y.o.  Admit date: 01/01/2016 Discharge date: 01/01/2016  Primary Care Provider: Lottie Dawson, MD Primary Cardiologist: Dr Stanford Breed  Discharge Diagnoses Principal Problem:   Atrial fibrillation with [facility]) Active Problems:   Chronic anticoagulation   Type 2 diabetes mellitus with other circulatory complications (HCC)   Hyperlipidemia   GERD   Atrial fibrillation [facility])    Procedures: elective cardioversion 01/01/16   Hospital Course: 65 y.o. male with a PMHx of CAD s/p stent to LAD in 2003, parox AFib on Tikosyn, HL, OSA , he is being followed by Dr Stanford Breed, the last on 11/27/2015 when he was doing well. In the past he underwent DCCV in 05/2012 for AFib, he has seen Dr. Thompson Grayer and ablation could be considered if he lost weight. Patient seen in emergency room in August with recurrent atrial fibrillation. He was given Cardizem but developed a rash. He underwent successful cardioversion.Continued on Tikosyn. He developed palpitations at 10 pm the last night and came to the ER this am. ECG showed a-fib and he was cardioverted by the ER physician back to [place]. He was admitted for observation and monitored for 12 hrs as per post cardioversion/ Tikosyn protocol. He'll f/u in the office 2 weeks.   Discharge Vitals:  Blood pressure 108/65, pulse 58, temperature 98.2 F (36.8 C), temperature source Oral, resp. rate 17, SpO2 95 %.    Labs: Results for orders placed or performed during the hospital encounter of 01/01/16 (from the past 24 hour(s))  Basic metabolic panel     Status: Abnormal   Collection Time: 01/01/16  8:10 AM  Result Value Ref Range   Sodium 140 135 - 145 mmol/L   Potassium 4.2 3.5 - 5.1 mmol/L   Chloride 101 101 - 111 mmol/L   CO2 28 22 - 32 mmol/L   Glucose, Bld 175 (H) 65 - 99 mg/dL   BUN 16 6 - 20 mg/dL   Creatinine, Ser 0.92 0.61 - 1.24 mg/dL   Calcium 9.6 8.9 - 10.3 mg/dL   GFR calc non Af Amer >60 >60 mL/min   GFR calc Af Amer >60 >60 mL/min   Anion gap 11 5 - 15  CBC     Status: Abnormal   Collection Time: 01/01/16  8:10 AM  Result Value Ref Range   WBC 7.9 4.0 - 10.5 K/uL   RBC 5.29 4.22 - 5.81 MIL/uL   Hemoglobin 16.1 13.0 - 17.0 g/dL   HCT 48.5 39.0 - 52.0 %   MCV 91.7 78.0 - 100.0 fL   MCH 30.4 26.0 - 34.0 pg   MCHC 33.2 30.0 - 36.0 g/dL   RDW 13.0 11.5 - 15.5 %   Platelets 135 (L) 150 - 400 K/uL  Troponin I     Status: None   Collection Time: 01/01/16  8:10 AM  Result Value Ref Range   Troponin I <0.03 <0.031 ng/mL  Magnesium     Status: None   Collection Time: 01/01/16  8:10 AM  Result Value Ref Range   Magnesium 2.0 1.7 - 2.4 mg/dL  Magnesium     Status: None   Collection Time: 01/01/16  8:10 AM  Result Value Ref Range   Magnesium 2.0 1.7 - 2.4 mg/dL    Disposition:      Follow-up Information    Follow up with [name], MD.   Specialty:  Cardiology   Why:  offce will call  you   Contact information:   [address] [place] [address] [phone]       Discharge Medications:    Medication List    STOP taking these medications        famotidine 20 MG tablet  Commonly known as:  PEPCID      TAKE these medications        acetaminophen 325 MG tablet  Commonly known as:  TYLENOL  Take 650 mg by mouth every 6 (six) hours as needed for mild pain.     albuterol 108 (90 Base) MCG/ACT inhaler  Commonly known as:  PROVENTIL HFA;VENTOLIN HFA  Inhale 2 puffs into the lungs every 6 (six) hours as needed for wheezing or shortness of breath.     atorvastatin 80 MG tablet  Commonly known as:  LIPITOR  Take 1 tablet by mouth  daily     diphenhydrAMINE 25 MG tablet  Commonly known as:  SOMINEX  Take 25 mg by mouth at bedtime as needed for sleep.     dofetilide 500 MCG capsule  Commonly known as:  TIKOSYN  Take 1 capsule (500 mcg total) by mouth 2 (two) times daily.     fish oil-omega-3 fatty acids  1000 MG capsule  Take 1 g by mouth 2 (two) times daily.     MAGNESIUM OXIDE PO  Take 750 mg by mouth every evening.     metFORMIN 500 MG 24 hr tablet  Commonly known as:  GLUCOPHAGE-XR  Take 1-2 tablets (500-1,000 mg total) by mouth 2 (two) times daily. Pt takes 559m in the am and 10030mat lunchtime     MULTIPLE VITAMIN PO  Take 1 tablet by mouth every evening.     nitroGLYCERIN 0.4 MG SL tablet  Commonly known as:  NITROSTAT  Place 1 tablet (0.4 mg total) under the tongue every 5 (five) minutes as needed.     ONETOUCH DELICA LANCETS 3373Zisc  USE AS DIRECTED     ONETOUCH VERIO IQ SYSTEM w/Device Kit     ONETOUCH VERIO test strip  Generic drug:  glucose blood  USE TWICE A DAY     pantoprazole 40 MG tablet  Commonly known as:  PROTONIX  TAKE 1 TABLET (40 MG TOTAL) BY MOUTH DAILY.     potassium chloride SA 20 MEQ tablet  Commonly known as:  K-DUR,KLOR-CON  Take 1 tablet (20 mEq total) by mouth daily.     PRADAXA 150 MG Caps capsule  Generic drug:  dabigatran  Take 1 capsule by mouth  every 12 hours     Suvorexant 20 MG Tabs  Commonly known as:  BELSOMRA  Take 1 tablet by mouth at bedtime.         Duration of Discharge Encounter: Greater than 30 minutes including physician time.  SiAngelena FormA-C 01/01/2016 1:45 PM

## 2016-01-16 ENCOUNTER — Ambulatory Visit (INDEPENDENT_AMBULATORY_CARE_PROVIDER_SITE_OTHER): Payer: Medicare Other | Admitting: Cardiology

## 2016-01-16 ENCOUNTER — Encounter: Payer: Self-pay | Admitting: Cardiology

## 2016-01-16 VITALS — BP 128/60 | HR 72 | Ht 73.0 in | Wt 328.0 lb

## 2016-01-16 DIAGNOSIS — I4891 Unspecified atrial fibrillation: Secondary | ICD-10-CM

## 2016-01-16 NOTE — Progress Notes (Signed)
01/16/2016 [name] [name].   1951/10/03  [id number]  Primary Physician Lottie Dawson, MD Primary Cardiologist: Dr. Stanford Breed Electrophysiologist: Dr. Rayann Heman  Reason for Visit/CC: [name] for Atrial Fibrillation.   HPI:  65 y/o male followed by Dr. Stanford Breed with a h/o CAD s/p stent to LAD in 2003, parox AFib, HLD, obesity and OSA. He underwent DCCV in 05/2012 for AFib. He has seen Dr. Thompson Grayer and ablation could be considered if he lost weight. He has been managed medically, on AAD therapy with Tikosyn and is on Pradaxa for anticoagulation. He was seen in emergency room in August with recurrent atrial fibrillation. He was given Cardizem but developed a rash. He underwent successful cardioversion and was continued on tikosyn.   He presented back to the [facility] ED 01/01/16 with palpitations. ECG showed a-fib and he was cardioverted by the ER physician back to [place]. He was admitted for observation and monitored for 12 hrs as per post cardioversion/ Tikosyn protocol. He had no further afib.  K, mg and troponin were all normal. He denied any exogenous triggers including no recent alcohol or caffeine.   He presents to clinic today for post hospital f/u. He reports that he has done well since discharge. He denies any symptoms of breakthrough atrial fibrillation. He has been compliant with Tikosyn and Pradaxa. EKG today shows NSR. QT/QTC is stable at 432/445.   Current Outpatient Prescriptions  Medication Sig Dispense Refill  . acetaminophen (TYLENOL) 325 MG tablet Take 650 mg by mouth every 6 (six) hours as needed for mild pain.    [name] albuterol (PROVENTIL HFA;VENTOLIN HFA) 108 (90 BASE) MCG/ACT inhaler Inhale 2 puffs into the lungs every 6 (six) hours as needed for wheezing or shortness of breath. 1 Inhaler 1  . atorvastatin (LIPITOR) 80 MG tablet Take 1 tablet by mouth  daily 90 tablet 3  . Blood Glucose Monitoring Suppl (ONETOUCH VERIO IQ SYSTEM) W/DEVICE KIT Use daily for monitoring  blood glucose    . diphenhydrAMINE (SOMINEX) 25 MG tablet Take 25 mg by mouth at bedtime as needed for sleep.    [name] dofetilide (TIKOSYN) 500 MCG capsule Take 1 capsule (500 mcg total) by mouth 2 (two) times daily. 180 capsule 3  . fish oil-omega-3 fatty acids 1000 MG capsule Take 1 g by mouth 2 (two) times daily.     [name] MAGNESIUM OXIDE PO Take 750 mg by mouth every evening.     . metFORMIN (GLUCOPHAGE-XR) 500 MG 24 hr tablet Take 1-2 tablets (500-1,000 mg total) by mouth 2 (two) times daily. Pt takes 578m in the am and 10094mat lunchtime 270 tablet 1  . MULTIPLE VITAMIN PO Take 1 tablet by mouth every evening.     . nitroGLYCERIN (NITROSTAT) 0.4 MG SL tablet Place 0.4 mg under the tongue every 5 (five) minutes as needed for chest pain.    . Glory RosebushELICA LANCETS 3374JISC USE AS DIRECTED 100 each 11  . ONETOUCH VERIO test strip USE TWICE A DAY 50 each 11  . pantoprazole (PROTONIX) 40 MG tablet TAKE 1 TABLET (40 MG TOTAL) BY MOUTH DAILY. 90 tablet 1  . potassium chloride SA (K-DUR,KLOR-CON) 20 MEQ tablet Take 1 tablet (20 mEq total) by mouth daily. 90 tablet 4  . PRADAXA 150 MG CAPS capsule Take 1 capsule by mouth  every 12 hours 180 capsule 1  . Suvorexant (BELSOMRA) 20 MG TABS Take 1 tablet by mouth at bedtime. 30 tablet 0   No current  facility-administered medications for this visit.    Allergies  Allergen Reactions  . Penicillins     Childhood reaction   . Procaine Hcl Hives and Other (See Comments)    Red in the face and neck   . Pseudoephedrine Other (See Comments)    Patient went into afib  . Sulfonamide Derivatives     Childhood reaction   . Cardizem [Diltiazem] Rash    Social History   Social History  . Marital Status: Married    Spouse Name: N/A  . Number of Children: N/A  . Years of Education: N/A   Occupational History  . Not on file.   Social History Main Topics  . Smoking status: Former Smoker -- 2.00 packs/day for 42 years    Types: Cigarettes    Quit date:  12/30/2008  . Smokeless tobacco: Never Used     Comment: started at age 57; 1-2 ppd; quit 2010  . Alcohol Use: No  . Drug Use: No  . Sexual Activity: Yes   Other Topics Concern  . Not on file   Social History Narrative   Retired paramedic   Regular exercise-yes not recently    Has children   Wife is overweight and has fibromyalgia and depression on disability doesn't go out much. Back surgery    Has older dog   Retired from [employer] 31 years of service now 7 years      Review of Systems: General: negative for chills, fever, night sweats or weight changes.  Cardiovascular: negative for chest pain, dyspnea on exertion, edema, orthopnea, palpitations, paroxysmal nocturnal dyspnea or shortness of breath Dermatological: negative for rash Respiratory: negative for cough or wheezing Urologic: negative for hematuria Abdominal: negative for nausea, vomiting, diarrhea, bright red blood per rectum, melena, or hematemesis Neurologic: negative for visual changes, syncope, or dizziness All other systems reviewed and are otherwise negative except as noted above.    Blood pressure 128/60, pulse 72, height _0  (1.854 m), weight 328 lb (148.78 kg).  General appearance: alert, cooperative and no distress Neck: no carotid bruit and no JVD Lungs: clear to auscultation bilaterally Heart: regular rate and rhythm, S1, S2 normal, no murmur, click, rub or gallop Extremities: no LEE Pulses: 2+ and symmetric Skin: warm and dry Neurologic: Grossly normal  EKG NSR. Qt/QTc 432/445  ASSESSMENT AND PLAN:   1. PAF: s/p recent cardioversion. Maintaining NSR on Tikosyn. HR is well controlled. QT/QTc is stable at 432/445. He has been fully compliant with Pradaxa and continues to avoid potential triggers. Patient reminded that afib ablation could be considered in the future, but he will need to loose weight if this is desired. For now, continue medical therapy. He has had 2 relapses requiring cardioversion in  the last 6 months. If any further relapses, will need to consider referral to Afib clinic for consideration for other AAD therapies.   2. OSA: he reports full compliance with CPAP. Weight loss advised.   3. CAD: s/p stent to LAD in 2003. Stable w/o angina. Continue statin. Not on ASA, given the need for Pradaxa.   4. HTN: BP is well controlled on current regimen.   5. HDL: on statin + fish oil. Followed by Dr. Stanford Breed.    PLAN  F/u with Dr. Stanford Breed as directed. Due back for f/u in April.   Lyda Jester PA-C 01/16/2016 10:22 AM

## 2016-01-16 NOTE — Patient Instructions (Signed)
Medication Instructions:  Your physician recommends that you continue on your current medications as directed. Please refer to the Current Medication list given to you today.   Labwork: None ordered  Testing/Procedures: None ordered  Follow-Up: Follow up with Dr.Crenshaw as planned in May 2017  Any Other Special Instructions Will Be Listed Below (If Applicable).     If you need a refill on your cardiac medications before your next appointment, please call your pharmacy.

## 2016-01-24 ENCOUNTER — Ambulatory Visit: Payer: Medicare Other | Admitting: Pulmonary Disease

## 2016-02-05 ENCOUNTER — Other Ambulatory Visit: Payer: Self-pay | Admitting: Cardiology

## 2016-02-14 ENCOUNTER — Encounter: Payer: Self-pay | Admitting: Pulmonary Disease

## 2016-02-14 ENCOUNTER — Ambulatory Visit (INDEPENDENT_AMBULATORY_CARE_PROVIDER_SITE_OTHER): Payer: Medicare Other | Admitting: Pulmonary Disease

## 2016-02-14 VITALS — BP 112/72 | HR 72 | Ht 73.0 in | Wt 329.0 lb

## 2016-02-14 DIAGNOSIS — Z6841 Body Mass Index (BMI) 40.0 and over, adult: Secondary | ICD-10-CM | POA: Diagnosis not present

## 2016-02-14 DIAGNOSIS — G4733 Obstructive sleep apnea (adult) (pediatric): Secondary | ICD-10-CM | POA: Diagnosis not present

## 2016-02-14 DIAGNOSIS — Z9989 Dependence on other enabling machines and devices: Principal | ICD-10-CM

## 2016-02-14 NOTE — Patient Instructions (Signed)
Will arrange for new CPAP machine and supplies  Follow up in 1 year 

## 2016-02-14 NOTE — Progress Notes (Signed)
Current Outpatient Prescriptions on File Prior to Visit  Medication Sig  . acetaminophen (TYLENOL) 325 MG tablet Take 650 mg by mouth every 6 (six) hours as needed for mild pain.  [name] albuterol (PROVENTIL HFA;VENTOLIN HFA) 108 (90 BASE) MCG/ACT inhaler Inhale 2 puffs into the lungs every 6 (six) hours as needed for wheezing or shortness of breath.  [name] atorvastatin (LIPITOR) 80 MG tablet Take 1 tablet by mouth  daily  . Blood Glucose Monitoring Suppl (ONETOUCH VERIO IQ SYSTEM) W/DEVICE KIT Use daily for monitoring blood glucose  . diphenhydrAMINE (SOMINEX) 25 MG tablet Take 25 mg by mouth at bedtime as needed for sleep.  [name] dofetilide (TIKOSYN) 500 MCG capsule Take 1 capsule (500 mcg total) by mouth 2 (two) times daily.  . fish oil-omega-3 fatty acids 1000 MG capsule Take 1 g by mouth 2 (two) times daily.   [name] KLOR-CON M20 20 MEQ tablet TAKE 1 TABLET BY MOUTH EVERY DAY  . MAGNESIUM OXIDE PO Take 750 mg by mouth every evening.   . metFORMIN (GLUCOPHAGE) 500 MG tablet Take by mouth. Take 500 mg by mouth in the morning before breakfast and take 1000 mg by mouth in the evening.  . MULTIPLE VITAMIN PO Take 1 tablet by mouth every evening.   . nitroGLYCERIN (NITROSTAT) 0.4 MG SL tablet Place 0.4 mg under the tongue every 5 (five) minutes as needed for chest pain.  [name] DELICA LANCETS 27C MISC USE AS DIRECTED  . ONETOUCH VERIO test strip USE TWICE A DAY  . pantoprazole (PROTONIX) 40 MG tablet TAKE 1 TABLET (40 MG TOTAL) BY MOUTH DAILY.  [name] PRADAXA 150 MG CAPS capsule Take 1 capsule by mouth  every 12 hours  . Suvorexant (BELSOMRA) 20 MG TABS Take 1 tablet by mouth at bedtime.   No current facility-administered medications on file prior to visit.     Chief Complaint  Patient presents with  . Follow-up    Former [place] patient: Wears CPAP nightly. Denies issues with pressure setting. Full face mask seems to fit well- [place] quattro large- DME: [facility].      Tests PSG 12/26/02 >> AHI 25  Past medical  hx HLD, Thrombocytopenia, ED, CAD s/p stent, A fib s/p ablation, GERD, DM, Renal cyst  Past surgical hx, Allergies, Family hx, Social hx all reviewed.  Vital Signs BP 112/72 mmHg  Pulse 72  Ht 6' 1"  (1.854 m)  Wt 329 lb (149.233 kg)  BMI 43.42 kg/m2  SpO2 92%  History of Present Illness [place]. is a 65 y.o. male with OSA.  He was followed by Dr. Gwenette Greet.  He has been using the same machine since about 2005.  He was told his machine is too old, and he can't get replacement supplies for this.  He uses his machine every night.  He feels CPAP helps his sleep.    Physical Exam  General - No distress ENT - No sinus tenderness, no oral exudate, no LAN, MP 3 Cardiac - s1s2 regular, no murmur Chest - No wheeze/rales/dullness Back - No focal tenderness Abd - Soft, non-tender Ext - No edema Neuro - Normal strength Skin - No rashes Psych - normal mood, and behavior   Assessment/Plan  Obstructive sleep apnea. Plan: - will arrange for new auto CPAP with range 5 to 20 cm H2O - will arrange for new CPAP mask and supplies - will get copy of his download after he has used new machine  Insomnia. Plan: - he has  been using belsomra >> okay to continue  Morbid obesity. Plan: - discussed importance of weight loss    Patient Instructions  Will arrange for new CPAP machine and supplies  Follow up in 1 year     Chesley Mires, MD [place] Pager:  [phone]

## 2016-02-14 NOTE — Progress Notes (Deleted)
   Subjective:    Patient ID: [name] [name]., male    DOB: 1951/09/03, 65 y.o.   MRN: [id number]  HPI    Review of Systems     Objective:   Physical Exam        Assessment & Plan:

## 2016-03-06 ENCOUNTER — Other Ambulatory Visit: Payer: Self-pay | Admitting: Cardiology

## 2016-03-06 NOTE — Telephone Encounter (Signed)
Rx request sent to pharmacy.  

## 2016-03-23 ENCOUNTER — Other Ambulatory Visit: Payer: Self-pay | Admitting: Internal Medicine

## 2016-04-04 ENCOUNTER — Other Ambulatory Visit: Payer: Self-pay | Admitting: Internal Medicine

## 2016-04-11 ENCOUNTER — Encounter: Payer: Self-pay | Admitting: Pulmonary Disease

## 2016-04-11 ENCOUNTER — Ambulatory Visit (INDEPENDENT_AMBULATORY_CARE_PROVIDER_SITE_OTHER): Payer: Medicare Other | Admitting: Pulmonary Disease

## 2016-04-11 VITALS — BP 130/80 | HR 73 | Ht 73.0 in | Wt 324.8 lb

## 2016-04-11 DIAGNOSIS — Z9989 Dependence on other enabling machines and devices: Principal | ICD-10-CM

## 2016-04-11 DIAGNOSIS — G4733 Obstructive sleep apnea (adult) (pediatric): Secondary | ICD-10-CM

## 2016-04-11 NOTE — Patient Instructions (Signed)
Will have your home care company change your CPAP pressure range  Call if you are still having trouble using CPAP after pressure change  Follow up in 1 year

## 2016-04-11 NOTE — Progress Notes (Signed)
Current Outpatient Prescriptions on File Prior to Visit  Medication Sig  . acetaminophen (TYLENOL) 325 MG tablet Take 650 mg by mouth every 6 (six) hours as needed for mild pain.  [name] albuterol (PROVENTIL HFA;VENTOLIN HFA) 108 (90 BASE) MCG/ACT inhaler Inhale 2 puffs into the lungs every 6 (six) hours as needed for wheezing or shortness of breath.  [name] atorvastatin (LIPITOR) 80 MG tablet Take 1 tablet by mouth  daily  . Blood Glucose Monitoring Suppl (ONETOUCH VERIO IQ SYSTEM) W/DEVICE KIT Use daily for monitoring blood glucose  . diphenhydrAMINE (SOMINEX) 25 MG tablet Take 25 mg by mouth at bedtime as needed for sleep.  [name] dofetilide (TIKOSYN) 500 MCG capsule Take 1 capsule by mouth two times daily  . fish oil-omega-3 fatty acids 1000 MG capsule Take 1 g by mouth 2 (two) times daily.   [name] KLOR-CON M20 20 MEQ tablet TAKE 1 TABLET BY MOUTH EVERY DAY  . MAGNESIUM OXIDE PO Take 750 mg by mouth every evening.   . metFORMIN (GLUCOPHAGE) 500 MG tablet Take by mouth. Take 500 mg by mouth in the morning before breakfast and take 1000 mg by mouth in the evening.  . MULTIPLE VITAMIN PO Take 1 tablet by mouth every evening.   . nitroGLYCERIN (NITROSTAT) 0.4 MG SL tablet Place 0.4 mg under the tongue every 5 (five) minutes as needed for chest pain.  [name] [name] DELICA LANCETS 55V MISC USE AS DIRECTED  . ONETOUCH VERIO test strip USE TWICE A DAY  . pantoprazole (PROTONIX) 40 MG tablet TAKE 1 TABLET (40 MG TOTAL) BY MOUTH DAILY.  [name] PRADAXA 150 MG CAPS capsule Take 1 capsule by mouth  every 12 hours  . Suvorexant (BELSOMRA) 20 MG TABS Take 1 tablet by mouth at bedtime.   No current facility-administered medications on file prior to visit.     Chief Complaint  Patient presents with  . Follow-up    Wears CPAP nightly. Feels that the pressure setting is too low. Mask fit is well. DME: [facility]     Tests PSG 12/26/02 >> AHI 25 Auto CPAP 01/11/16 to 04/09/16 >> used on 47 of 90 nights with average 4 hrs.  Average AHI  16.2 with median CPAP 14 and 95 th percentile CPAP 18 cm H2O  Past medical hx HLD, Thrombocytopenia, ED, CAD s/p stent, A fib s/p ablation, GERD, DM, Renal cyst  Past surgical hx, Allergies, Family hx, Social hx all reviewed.  Vital Signs BP 130/80 mmHg  Pulse 73  Ht _0  (1.854 m)  Wt 324 lb 12.8 oz (147.328 kg)  BMI 42.86 kg/m2  SpO2 93%  History of Present Illness [name] [name]. is a 65 y.o. male with OSA.  He doesn't feel like the machine is delivering pressure the way he needs it to.  He has to take the mask off, and then the machine senses that he needs more pressure.  He then puts his mask back on and feels better with using machine.  He has full face mask >> he has not tried other masks due to concern about mouth breathing and nasal septal deviation.   Physical Exam  General - No distress ENT - No sinus tenderness, no oral exudate, no LAN, nasal septum deviated to Lt, MP 3 Cardiac - s1s2 regular, no murmur Chest - No wheeze/rales/dullness Back - No focal tenderness Abd - Soft, non-tender Ext - No edema Neuro - Normal strength Skin - No rashes Psych - normal mood, and behavior  Discussion: He has trouble using CPAP related to feeling like he is not getting sufficient pressure from his machine at times.  This could be related to needing lower limit of auto CPAP setting to be raised, or posterior pharyngeal obstruction related to oral mask and relatively higher oral airway pressure when using CPAP.  Assessment/Plan  Obstructive sleep apnea. - will have his DME change his auto CPAP range to 10 to 20 cm H2O - if he still has trouble, then might need to change him to nasal mask  Insomnia. Plan: - he has been using belsomra >> okay to continue  Morbid obesity. Plan: - discussed importance of weight loss    Patient Instructions  Will have your home care company change your CPAP pressure range  Call if you are still having trouble using CPAP after pressure  change  Follow up in 1 year     [name] Mires, MD [place] [facility] Pager:  [phone] 04/11/2016, 10:04 AM

## 2016-04-16 ENCOUNTER — Other Ambulatory Visit: Payer: Self-pay | Admitting: Internal Medicine

## 2016-04-17 NOTE — Telephone Encounter (Signed)
Sent to the pharmacy by e-scribe. 

## 2016-04-23 ENCOUNTER — Other Ambulatory Visit: Payer: Self-pay | Admitting: Internal Medicine

## 2016-04-23 NOTE — Telephone Encounter (Signed)
Ok to refill for 6 months 

## 2016-04-24 NOTE — Telephone Encounter (Signed)
Called to the pharmacy and left on machine. 

## 2016-05-13 ENCOUNTER — Encounter: Payer: Self-pay | Admitting: Cardiology

## 2016-05-20 NOTE — Progress Notes (Signed)
HPI: FU hx of CAD s/p stent to LAD in 2003, parox AFib, HL, OSA. He underwent DCCV in 05/2012 for AFib. He has seen Dr. Thompson Grayer and ablation could be considered if he lost weight. Previous rash with cardizem; on tikosyn. Patient had recurrent atrial fibrillation in January 2017. He was cardioverted. Troponins negative. Since last seen, the patient has dyspnea with more extreme activities but not with routine activities. It is relieved with rest. It is not associated with chest pain. There is no orthopnea, PND or pedal edema. There is no syncope or palpitations. There is no exertional chest pain.   Current Outpatient Prescriptions  Medication Sig Dispense Refill  . acetaminophen (TYLENOL) 325 MG tablet Take 650 mg by mouth every 6 (six) hours as needed for mild pain.    [name] albuterol (PROVENTIL HFA;VENTOLIN HFA) 108 (90 BASE) MCG/ACT inhaler Inhale 2 puffs into the lungs every 6 (six) hours as needed for wheezing or shortness of breath. 1 Inhaler 1  . atorvastatin (LIPITOR) 80 MG tablet Take 1 tablet by mouth  daily 90 tablet 3  . BELSOMRA 20 MG TABS TAKE 1 TABLET BY MOUTH EVERY DAY AT BEDTIME 30 tablet 5  . Blood Glucose Monitoring Suppl (ONETOUCH VERIO IQ SYSTEM) W/DEVICE KIT Use daily for monitoring blood glucose    . diphenhydrAMINE (SOMINEX) 25 MG tablet Take 25 mg by mouth at bedtime as needed for sleep.    [name] dofetilide (TIKOSYN) 500 MCG capsule Take 1 capsule by mouth two times daily 180 capsule 0  . fish oil-omega-3 fatty acids 1000 MG capsule Take 1 g by mouth 2 (two) times daily.     [name] KLOR-CON M20 20 MEQ tablet TAKE 1 TABLET BY MOUTH EVERY DAY 90 tablet 1  . MAGNESIUM OXIDE PO Take 750 mg by mouth every evening.     . metFORMIN (GLUCOPHAGE-XR) 500 MG 24 hr tablet Take 3 tablets by mouth  daily with breakfast 270 tablet 0  . MULTIPLE VITAMIN PO Take 1 tablet by mouth every evening.     . nitroGLYCERIN (NITROSTAT) 0.4 MG SL tablet Place 0.4 mg under the tongue every 5 (five)  minutes as needed for chest pain.    [name] DELICA LANCETS 94T MISC USE AS DIRECTED 100 each 11  . ONETOUCH VERIO test strip USE TWICE A DAY 50 each 11  . pantoprazole (PROTONIX) 40 MG tablet TAKE 1 TABLET (40 MG TOTAL) BY MOUTH DAILY. 90 tablet 0  . PRADAXA 150 MG CAPS capsule Take 1 capsule by mouth  every 12 hours 180 capsule 1   No current facility-administered medications for this visit.     Past Medical History  Diagnosis Date  . HYPERLIPIDEMIA 03/22/2009  . Obesity, unspecified 04/24/2009  . THROMBOCYTOPENIA 08/16/2010  . ERECTILE DYSFUNCTION 03/22/2009  . SLEEP APNEA, OBSTRUCTIVE 03/22/2009    compliant with CPAP  . 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 Coronary atherosclerosis of native coronary artery 11/2002    a. s/p stent to LAD 12/03; OM2 occluded at cath 12/03; d. myoview 5/10: no ischemia;  e. echo 7/11: EF 55%, BAE, mild RVE, PASP 41-45; Myoview was in March 2013. There was no ischemia or infarction, EF 51%   . Atrial fibrillation ([place]) 03/22/2009    a. s/p multiple DCCV; b. no coumadin due to low TE risk profile; c. Tikosyn Rx  . GERD 03/22/2009  . LATERAL EPICONDYLITIS, LEFT 10/24/2009  . HYPERGLYCEMIA 04/25/2010  . LIVER FUNCTION TESTS, ABNORMAL 04/25/2010  .  TOBACCO USE, QUIT 10/24/2009  . Umbilical hernia   . Myocardial infarction ([place]) mi2003  . Local reaction to immunization 05/05/2012    minor resolving  zostavax   . Numbness in left leg     foot related to back disease and surgery  . Diabetes mellitus without complication ([place])   . Renal cyst     Characterized by MRI as simple  . Iliac aneurysm ([facility])     2.6 to be evaluated incidental finding on CT  . Ruptured suppurative appendicitis     2015   . Drusen body     see opth note  . Perforated appendicitis with necrosis s/p open appendectomy 06/07/14 06/04/2014    Past Surgical History  Procedure Laterality Date  . Cyst on epiglottis  08/2002  . Surgery l4-l5   1998    ruptured x 3  . Stent      LAD DUS  2004  . Ulnar neuropathy    . Umbilical hernia repair      mesh  . Lumbar disc surgery      two  holes in spinalcovering  . Esophagogastroduodenoscopy  11/15/2011    Procedure: ESOPHAGOGASTRODUODENOSCOPY (EGD);  Surgeon: Juanita Craver, MD;  Location: [facility];  Service: Endoscopy;  Laterality: N/A;  . Colonoscopy  11/15/2011    Procedure: COLONOSCOPY;  Surgeon: Juanita Craver, MD;  Location: [facility];  Service: Endoscopy;  Laterality: N/A;  . Laparoscopic appendectomy N/A 06/06/2014    Procedure: APPENDECTOMY LAPAROSCOPIC attemted;  Surgeon: Odis Hollingshead, MD;  Location: [facility];  Service: General;  Laterality: N/A;  . Appendectomy N/A 06/06/2014    Procedure: APPENDECTOMY;  Surgeon: Odis Hollingshead, MD;  Location: [facility];  Service: General;  Laterality: N/A;  . Colon surgery      Social History   Social History  . Marital Status: Married    Spouse Name: N/A  . Number of Children: N/A  . Years of Education: N/A   Occupational History  . Not on file.   Social History Main Topics  . Smoking status: Former Smoker -- 2.00 packs/day for 42 years    Types: Cigarettes    Quit date: 12/30/2008  . Smokeless tobacco: Never Used     Comment: started at age 30; 1-2 ppd; quit 2010  . Alcohol Use: No  . Drug Use: No  . Sexual Activity: Yes   Other Topics Concern  . Not on file   Social History Narrative   Retired paramedic   Regular exercise-yes not recently    Has children   Wife is overweight and has fibromyalgia and depression on disability doesn't go out much. Back surgery    Has older dog   Retired from [employer] 31 years of service now 7 years     Family History  Problem Relation Age of Onset  . Thyroid disease Mother   . Ovarian cancer Mother   . Breast cancer Mother   . Lung cancer Father   . Cancer Father     ROS: no fevers or chills, productive cough, hemoptysis, dysphasia, odynophagia, melena, hematochezia, dysuria, hematuria, rash, seizure activity, orthopnea,  PND, pedal edema, claudication. Remaining systems are negative.  Physical Exam: Well-developed obese in no acute distress.  Skin is warm and dry.  HEENT is normal.  Neck is supple.  Chest is clear to auscultation with normal expansion.  Cardiovascular exam is regular rate and rhythm.  Abdominal exam nontender or distended. No masses palpated. Extremities show trace edema. neuro grossly  intact  Electrocardiogram shows sinus rhythm at a rate of 70.No ST changes. QT is not prolonged. First-degree AV block.

## 2016-05-21 ENCOUNTER — Encounter: Payer: Self-pay | Admitting: Cardiology

## 2016-05-21 ENCOUNTER — Ambulatory Visit (INDEPENDENT_AMBULATORY_CARE_PROVIDER_SITE_OTHER): Payer: Medicare Other | Admitting: Cardiology

## 2016-05-21 VITALS — BP 140/68 | HR 70 | Ht 73.0 in | Wt 324.0 lb

## 2016-05-21 DIAGNOSIS — I2510 Atherosclerotic heart disease of native coronary artery without angina pectoris: Secondary | ICD-10-CM | POA: Diagnosis not present

## 2016-05-21 DIAGNOSIS — I723 Aneurysm of iliac artery: Secondary | ICD-10-CM | POA: Diagnosis not present

## 2016-05-21 DIAGNOSIS — E785 Hyperlipidemia, unspecified: Secondary | ICD-10-CM | POA: Diagnosis not present

## 2016-05-21 DIAGNOSIS — I4891 Unspecified atrial fibrillation: Secondary | ICD-10-CM | POA: Diagnosis not present

## 2016-05-21 DIAGNOSIS — E669 Obesity, unspecified: Secondary | ICD-10-CM

## 2016-05-21 NOTE — Assessment & Plan Note (Signed)
Continue statin. No aspirin or the need for anticoagulation.

## 2016-05-21 NOTE — Assessment & Plan Note (Addendum)
Patient remains in sinus rhythm on examination. Continue tikosyn and pradaxa. Check potassium, renal function, hemoglobin and magnesium. Check echo for LV function.

## 2016-05-21 NOTE — Assessment & Plan Note (Signed)
Patient counseled on need for weight loss.

## 2016-05-21 NOTE — Assessment & Plan Note (Signed)
Followed by vascular surgery. 

## 2016-05-21 NOTE — Assessment & Plan Note (Addendum)
Continue statin. Check lipids and liver. 

## 2016-05-21 NOTE — Patient Instructions (Signed)
Medication Instructions:   NO CHANGE  Labwork:  Your physician recommends that you return for lab work WHEN FASTING  Testing/Procedures:  Your physician has requested that you have an echocardiogram. Echocardiography is a painless test that uses sound waves to create images of your heart. It provides your doctor with information about the size and shape of your heart and how well your heart's chambers and valves are working. This procedure takes approximately one hour. There are no restrictions for this procedure.    Follow-Up:  Your physician wants you to follow-up in: 6 MONTHS WITH DR CRENSHAW You will receive a reminder letter in the mail two months in advance. If you don't receive a letter, please call our office to schedule the follow-up appointment.   If you need a refill on your cardiac medications before your next appointment, please call your pharmacy.    

## 2016-05-23 ENCOUNTER — Other Ambulatory Visit: Payer: Self-pay | Admitting: Cardiology

## 2016-05-23 NOTE — Telephone Encounter (Signed)
Rx(s) sent to pharmacy electronically.  

## 2016-06-06 ENCOUNTER — Ambulatory Visit (HOSPITAL_COMMUNITY): Payer: Medicare Other | Attending: Cardiology

## 2016-06-06 ENCOUNTER — Other Ambulatory Visit: Payer: Self-pay

## 2016-06-06 DIAGNOSIS — Z6841 Body Mass Index (BMI) 40.0 and over, adult: Secondary | ICD-10-CM | POA: Diagnosis not present

## 2016-06-06 DIAGNOSIS — G4733 Obstructive sleep apnea (adult) (pediatric): Secondary | ICD-10-CM | POA: Insufficient documentation

## 2016-06-06 DIAGNOSIS — I2510 Atherosclerotic heart disease of native coronary artery without angina pectoris: Secondary | ICD-10-CM | POA: Diagnosis not present

## 2016-06-06 DIAGNOSIS — I059 Rheumatic mitral valve disease, unspecified: Secondary | ICD-10-CM | POA: Diagnosis not present

## 2016-06-06 DIAGNOSIS — I517 Cardiomegaly: Secondary | ICD-10-CM | POA: Insufficient documentation

## 2016-06-06 DIAGNOSIS — Z87891 Personal history of nicotine dependence: Secondary | ICD-10-CM | POA: Insufficient documentation

## 2016-06-06 DIAGNOSIS — E669 Obesity, unspecified: Secondary | ICD-10-CM | POA: Insufficient documentation

## 2016-06-06 DIAGNOSIS — E785 Hyperlipidemia, unspecified: Secondary | ICD-10-CM | POA: Diagnosis not present

## 2016-06-06 DIAGNOSIS — I4891 Unspecified atrial fibrillation: Secondary | ICD-10-CM | POA: Insufficient documentation

## 2016-06-06 LAB — ECHOCARDIOGRAM COMPLETE
Ao-asc: 43 cm
E decel time: 317 msec
E/e' ratio: 6.54
FS: 28 % (ref 28–44)
IVS/LV PW RATIO, ED: 0.9
LA ID, A-P, ES: 43 mm
LA diam end sys: 43 mm
LA diam index: 1.53 cm/m2
LA vol A4C: 54 ml
LA vol index: 18.1 mL/m2
LA vol: 51 mL
LV E/e' medial: 6.54
LV E/e'average: 6.54
LV PW d: 14.7 mm — AB (ref 0.6–1.1)
LV e' LATERAL: 9.43 cm/s
LVOT SV: 99 mL
LVOT VTI: 23.9 cm
LVOT area: 4.15 cm2
LVOT diameter: 23 mm
LVOT peak grad rest: 6 mmHg
LVOT peak vel: 125 cm/s
MV Dec: 317
MV pk A vel: 76.8 m/s
MV pk E vel: 61.7 m/s
TDI e' lateral: 9.43
TDI e' medial: 8.11

## 2016-06-13 ENCOUNTER — Encounter: Payer: Self-pay | Admitting: Internal Medicine

## 2016-06-14 NOTE — Telephone Encounter (Signed)
[name] make sure these labs are done at lab visit ( only tests not routine cpx is magnesium)  Let him know   Thanks [facility]

## 2016-06-17 ENCOUNTER — Other Ambulatory Visit: Payer: Self-pay | Admitting: Family Medicine

## 2016-06-17 DIAGNOSIS — E785 Hyperlipidemia, unspecified: Secondary | ICD-10-CM

## 2016-06-18 ENCOUNTER — Other Ambulatory Visit (INDEPENDENT_AMBULATORY_CARE_PROVIDER_SITE_OTHER): Payer: Medicare Other

## 2016-06-18 DIAGNOSIS — E785 Hyperlipidemia, unspecified: Secondary | ICD-10-CM

## 2016-06-18 DIAGNOSIS — Z0000 Encounter for general adult medical examination without abnormal findings: Secondary | ICD-10-CM | POA: Diagnosis not present

## 2016-06-18 LAB — CBC WITH DIFFERENTIAL/PLATELET
Basophils Absolute: 0 10*3/uL (ref 0.0–0.1)
Basophils Relative: 0.3 % (ref 0.0–3.0)
Eosinophils Absolute: 0.6 10*3/uL (ref 0.0–0.7)
Eosinophils Relative: 7.7 % — ABNORMAL HIGH (ref 0.0–5.0)
HCT: 48.5 % (ref 39.0–52.0)
Hemoglobin: 16.6 g/dL (ref 13.0–17.0)
Lymphocytes Relative: 31.5 % (ref 12.0–46.0)
Lymphs Abs: 2.5 10*3/uL (ref 0.7–4.0)
MCHC: 34.2 g/dL (ref 30.0–36.0)
MCV: 91.6 fl (ref 78.0–100.0)
Monocytes Absolute: 0.7 10*3/uL (ref 0.1–1.0)
Monocytes Relative: 8.1 % (ref 3.0–12.0)
Neutro Abs: 4.2 10*3/uL (ref 1.4–7.7)
Neutrophils Relative %: 52.4 % (ref 43.0–77.0)
Platelets: 134 10*3/uL — ABNORMAL LOW (ref 150.0–400.0)
RBC: 5.29 Mil/uL (ref 4.22–5.81)
RDW: 13.5 % (ref 11.5–15.5)
WBC: 8 10*3/uL (ref 4.0–10.5)

## 2016-06-18 LAB — BASIC METABOLIC PANEL
BUN: 17 mg/dL (ref 6–23)
CO2: 28 mEq/L (ref 19–32)
Calcium: 9.8 mg/dL (ref 8.4–10.5)
Chloride: 104 mEq/L (ref 96–112)
Creatinine, Ser: 0.79 mg/dL (ref 0.40–1.50)
GFR: 104.11 mL/min (ref >60.00)
Glucose, Bld: 133 mg/dL — ABNORMAL HIGH (ref 70–99)
Potassium: 4 mEq/L (ref 3.5–5.1)
Sodium: 140 mEq/L (ref 135–145)

## 2016-06-18 LAB — LIPID PANEL
Cholesterol: 96 mg/dL (ref 0–200)
HDL: 30.1 mg/dL — ABNORMAL LOW (ref >39.00)
LDL Cholesterol: 40 mg/dL (ref 0–99)
NonHDL: 65.47
Total CHOL/HDL Ratio: 3
Triglycerides: 128 mg/dL (ref 0.0–149.0)
VLDL: 25.6 mg/dL (ref 0.0–40.0)

## 2016-06-18 LAB — HEPATIC FUNCTION PANEL
ALT: 46 U/L (ref 0–53)
AST: 26 U/L (ref 0–37)
Albumin: 4.1 g/dL (ref 3.5–5.2)
Alkaline Phosphatase: 55 U/L (ref 39–117)
Bilirubin, Direct: 0.2 mg/dL (ref 0.0–0.3)
Total Bilirubin: 1.1 mg/dL (ref 0.2–1.2)
Total Protein: 6.8 g/dL (ref 6.0–8.3)

## 2016-06-18 LAB — MICROALBUMIN / CREATININE URINE RATIO
Creatinine,U: 171.1 mg/dL
Microalb Creat Ratio: 5.6 mg/g (ref 0.0–30.0)
Microalb, Ur: 9.6 mg/dL — ABNORMAL HIGH (ref 0.0–1.9)

## 2016-06-18 LAB — PSA: PSA: 0.8 ng/mL (ref 0.10–4.00)

## 2016-06-18 LAB — HEMOGLOBIN A1C: Hgb A1c MFr Bld: 7 % — ABNORMAL HIGH (ref 4.6–6.5)

## 2016-06-18 LAB — TSH: TSH: 2.85 u[IU]/mL (ref 0.35–4.50)

## 2016-06-18 LAB — MAGNESIUM: Magnesium: 2.1 mg/dL (ref 1.5–2.5)

## 2016-06-24 NOTE — Progress Notes (Signed)
Pre visit review using our clinic review tool, if applicable. No additional management support is needed unless otherwise documented below in the visit note.  Chief Complaint  Patient presents with  . Medicare Wellness  . Annual Exam  . Diabetes    HPI: [name] [name]. 65 y.o. comes in today for Preventive Medicare wellness visit .exam yearly and disease management. Since last visit. He did have an episode of A. fib he is good now on anticoagulation without active bleeding.   Dm  Up some is taking 3 metformin a day spreading it out denies any side effect. Has recently tried to do much better with simple carbs his wife who is mostly bedridden is also diabetic and is trying to help her. No sugar drinks no major vision changes numbness in feet except for that residual from his back surgery.  Blood pressure has been good stable follow-up with cardiology see notes.  Cardiology's trying to keep his magnesium on the high normal side.  Sleep apnea on a new mask getting 4-6 hours of sleep at the most taking Belsomra  20 costing $35 co-pay.  GERD get some rebound acid it stops PPI more than 3 days. Is allowed to take Pepcid.   Health Maintenance  Topic Date Due  . FOOT EXAM  05/08/2016  . INFLUENZA VACCINE  07/30/2016  . OPHTHALMOLOGY EXAM  09/17/2016  . HEMOGLOBIN A1C  12/18/2016  . URINE MICROALBUMIN  06/18/2017  . COLONOSCOPY  11/14/2021  . TETANUS/TDAP  04/05/2025  . ZOSTAVAX  Completed  . Hepatitis C Screening  Completed  . PNA vac Low Risk Adult  Completed   Health Maintenance Review LIFESTYLE:  TAD no etoh no  Rare   Sugar beverages: no  Sleep:  New cpap  Now adjusted   belsomra  20  35 per month  Average 4.5 on machine .    Hearing: fair   Vision:  No limitations at present . Last eye check UTD once a years.  Glasses    Safety:  Has smoke detector and wears seat belts.  No firearms. No excess sun exposure. Sees dentist regularly.  Falls:  no  Advance directive  :  Reviewed  Has one .  To be changed   Memory: Felt to be good  , no concern fromfamily.  Depression: No anhedonia unusual crying or depressive symptoms  Nutrition: Eats well balanced diet; adequate calcium and vitamin D. No swallowing chewing problems.  Injury: no major injuries in the last six months.  Other healthcare providers:  Reviewed today .  Social:  Lives with spouse married. No pets.   Preventive parameters: up-to-date  Reviewed   ADLS:   There are no problems or need for assistance  driving, feeding, obtaining food, dressing, toileting and bathing, managing money using phone. She is independent.  EXERCISE/ HABITS  pedometeer 1-2 miles per day .    ROS:  GEN/ HEENT: No fever, significant weight changes sweats headaches vision problems hearing changes, CV/ PULM; No chest pain shortness of breath cough, syncope,edema  change in exercise tolerance. GI /GU: No adominal pain, vomiting, change in bowel habits. No blood in the stool. No significant GU symptoms. SKIN/HEME: ,no acute skin rashes suspicious lesions or bleeding. No lymphadenopathy, nodules, masses.  NEURO/ PSYCH:  No neurologic signs such as weakness numbness. No depression anxiety. IMM/ Allergy: No unusual infections.  Allergy .   REST of 12 system review negative except as per HPI   Past Medical History  Diagnosis  Date  . [name] 03/22/2009  . Obesity, unspecified 04/24/2009  . THROMBOCYTOPENIA 08/16/2010  . ERECTILE DYSFUNCTION 03/22/2009  . SLEEP APNEA, OBSTRUCTIVE 03/22/2009    compliant with CPAP  . 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 Coronary atherosclerosis of native coronary artery 11/2002    a. s/p stent to LAD 12/03; OM2 occluded at cath 12/03; d. myoview 5/10: no ischemia;  e. echo 7/11: EF 55%, BAE, mild RVE, PASP 41-45; Myoview was in March 2013. There was no ischemia or infarction, EF 51%   . Atrial fibrillation ([place]) 03/22/2009    a. s/p multiple DCCV; b. no coumadin due to low TE risk  profile; c. Tikosyn Rx  . GERD 03/22/2009  . LATERAL EPICONDYLITIS, LEFT 10/24/2009  . HYPERGLYCEMIA 04/25/2010  . LIVER FUNCTION TESTS, ABNORMAL 04/25/2010  . TOBACCO USE, QUIT 10/24/2009  . Umbilical hernia   . Myocardial infarction ([place]) mi2003  . Local reaction to immunization 05/05/2012    minor resolving  zostavax   . Numbness in left leg     foot related to back disease and surgery  . Diabetes mellitus without complication ([place])   . Renal cyst     Characterized by MRI as simple  . Iliac aneurysm (H[facility])     2.6 to be evaluated incidental finding on CT  . Ruptured suppurative appendicitis     2015   . Drusen body     see opth note  . Perforated appendicitis with necrosis s/p open appendectomy 06/07/14 06/04/2014    Family History  Problem Relation Age of Onset  . Thyroid disease Mother   . Ovarian cancer Mother   . Breast cancer Mother   . Lung cancer Father   . Cancer Father     Social History   Social History  . Marital Status: Married    Spouse Name: N/A  . Number of Children: N/A  . Years of Education: N/A   Social History Main Topics  . Smoking status: Former Smoker -- 2.00 packs/day for 42 years    Types: Cigarettes    Quit date: 12/30/2008  . Smokeless tobacco: Never Used     Comment: started at age 1; 1-2 ppd; quit 2010  . Alcohol Use: No  . Drug Use: No  . Sexual Activity: Yes   Other Topics Concern  . Not on file   Social History Narrative   Retired paramedic   Regular exercise-yes not recently    Has children   Wife is overweight and has fibromyalgia and depression on disability doesn't go out much. Back surgery    Has older dog   Retired from [employer] years of service now 7 years     Outpatient Encounter Prescriptions as of 06/25/2016  Medication Sig  . acetaminophen (TYLENOL) 325 MG tablet Take 650 mg by mouth every 6 (six) hours as needed for mild pain.  [name] albuterol (PROVENTIL HFA;VENTOLIN HFA) 108 (90 BASE) MCG/ACT inhaler Inhale 2 puffs into  the lungs every 6 (six) hours as needed for wheezing or shortness of breath.  [name] atorvastatin (LIPITOR) 80 MG tablet Take 1 tablet by mouth  daily  . BELSOMRA 20 MG TABS TAKE 1 TABLET BY MOUTH EVERY DAY AT BEDTIME  . Blood Glucose Monitoring Suppl (ONETOUCH VERIO IQ SYSTEM) W/DEVICE KIT Use daily for monitoring blood glucose  . diphenhydrAMINE (SOMINEX) 25 MG tablet Take 25 mg by mouth at bedtime as needed for sleep.  [name] dofetilide (TIKOSYN) 500 MCG capsule Take 1 capsule by mouth two  times daily  . fish oil-omega-3 fatty acids 1000 MG capsule Take 1 g by mouth 2 (two) times daily.   [name] KLOR-CON M20 20 MEQ tablet TAKE 1 TABLET BY MOUTH EVERY DAY  . MAGNESIUM OXIDE PO Take 750 mg by mouth every evening.   . metFORMIN (GLUCOPHAGE-XR) 500 MG 24 hr tablet Take 3 tablets by mouth  daily with breakfast  . MULTIPLE VITAMIN PO Take 1 tablet by mouth every evening.   . nitroGLYCERIN (NITROSTAT) 0.4 MG SL tablet Place 0.4 mg under the tongue every 5 (five) minutes as needed for chest pain.  [name] DELICA LANCETS 40J MISC USE AS DIRECTED  . ONETOUCH VERIO test strip USE TWICE A DAY  . pantoprazole (PROTONIX) 40 MG tablet TAKE 1 TABLET (40 MG TOTAL) BY MOUTH DAILY.  [name] PRADAXA 150 MG CAPS capsule Take 1 capsule by mouth  every 12 hours   No facility-administered encounter medications on file as of 06/25/2016.    EXAM:  BP 126/70 mmHg  Temp(Src) 98.1 F (36.7 C) (Oral)  Ht 6' (1.829 m)  Wt 323 lb 14.4 oz (146.92 kg)  BMI 43.92 kg/m2  Body mass index is 43.92 kg/(m^2).  Physical Exam: Vital signs reviewed WJX:BJYN is a well-developed well-nourished alert cooperative   who appears stated age in no acute distress.  HEENT: normocephalic atraumatic , Eyes: PERRL EOM's full, conjunctiva clear, glasses Nares: paten,t no deformity discharge or tenderness., Ears: no deformity EAC's clear TMs with normal landmarks. Mouth: clear OP, no lesions, edema.  Moist mucous membranes. Dentition in adequate  repair. NECK: supple without masses, thyromegaly or bruits. CHEST/PULM:  Clear to auscultation and percussion breath sounds equal no wheeze , rales or rhonchi. No chest wall deformities or tenderness. CV: PMI is nondisplaced, S1 S2 no gallops, murmurs, rubs. Peripheral pulses are full without delay.No JVD .  ABDOMEN: Bowel sounds normal nontender  No guard or rebound, no hepato splenomegal no CVA tenderness.  Well-healed scars slight tenderness left upper quadrant no masses noted no rebound. Extremtities:  No clubbing cyanosis or edema, some varicose vein changes. no acute joint swelling or redness no focal atrophy NEURO:  Oriented x3, cranial nerves 3-12 appear to be intact, no obvious focal weakness,gait within normal limits no abnormal reflexes or asymmetrical SKIN: No acute rashes normal turgor, color, no bruising or petechiae. PSYCH: Oriented, good eye contact, no obvious depression anxiety, cognition and judgment appear normal. LN: no cervical axillary inguinal adenopathy No noted deficits in memory, attention, and speech. Diabetic Foot Exam - Simple   Simple Foot Form  Visual Inspection  No deformities, no ulcerations, no other skin breakdown bilaterally:  Yes  Sensation Testing  See comments:  Yes  Pulse Check  Posterior Tibialis and Dorsalis pulse intact bilaterally:  Yes  Comments  Some flat calluses no deep or ulcers. Left foot great toe distal and second toe decreased sensation from his previous back surgery.        Lab Results  Component Value Date   WBC 8.0 06/18/2016   HGB 16.6 06/18/2016   HCT 48.5 06/18/2016   PLT 134.0* 06/18/2016   GLUCOSE 133* 06/18/2016   CHOL 96 06/18/2016   TRIG 128.0 06/18/2016   HDL 30.10* 06/18/2016   LDLCALC 40 06/18/2016   ALT 46 06/18/2016   AST 26 06/18/2016   NA 140 06/18/2016   K 4.0 06/18/2016   CL 104 06/18/2016   CREATININE 0.79 06/18/2016   BUN 17 06/18/2016   CO2 28 06/18/2016  TSH 2.85 06/18/2016   PSA 0.80  06/18/2016   INR 1.27 06/05/2014   HGBA1C 7.0* 06/18/2016   MICROALBUR 9.6* 06/18/2016    ASSESSMENT AND PLAN:  Discussed the following assessment and plan:  Visit for preventive health examination  Type 2 diabetes mellitus with other circulatory complications (HCC)  Medication management - cont belsomra   Morbid obesity, unspecified obesity type ([place])  Hyperlipidemia  Chronic anticoagulation  Sleep disorder  Need for 23-polyvalent pneumococcal polysaccharide vaccine - Plan: Pneumococcal polysaccharide vaccine 23-valent greater than or equal to 2yo subcutaneous/IM  Thrombocytopenia [facility]) Care teams updated   Reviewed . Screens  Patient Care Team: Burnis Medin, MD as PCP - General Juanita Craver, MD (Gastroenterology) Lelon Perla, MD (Cardiology) Jackolyn Confer, MD as Consulting Physician (General Surgery) Georgeann Oppenheim, MD as Consulting Physician (Ophthalmology) Chesley Mires, MD as Consulting Physician (Pulmonary Disease)  Patient Instructions   Continue lifestyle intervention healthy eating and exercise . And ROV   in 3-4  Months  Will do   HG a1c at the visit  Consider  increasing to   4 -  500 mg    Per day ( max)  Avoid simple carbs read labels  For hidden   Sugars etc.  No cold cereals or calory beverages .   Wt Readings from Last 3 Encounters:  06/25/16 323 lb 14.4 oz (146.92 kg)  05/21/16 324 lb (146.965 kg)  04/11/16 324 lb 12.8 oz (147.328 kg)   pneumovax  Today   Health Maintenance, Male A healthy lifestyle and preventative care can promote health and wellness.  Maintain regular health, dental, and eye exams.  Eat a healthy diet. Foods like vegetables, fruits, whole grains, low-fat dairy products, and lean protein foods contain the nutrients you need and are low in calories. Decrease your intake of foods high in solid fats, added sugars, and salt. Get information about a proper diet from your health care provider, if necessary.  Regular physical  exercise is one of the most important things you can do for your health. Most adults should get at least 150 minutes of moderate-intensity exercise (any activity that increases your heart rate and causes you to sweat) each week. In addition, most adults need muscle-strengthening exercises on 2 or more days a week.   Maintain a healthy weight. The body mass index (BMI) is a screening tool to identify possible weight problems. It provides an estimate of body fat based on height and weight. Your health care provider can find your BMI and can help you achieve or maintain a healthy weight. For males 20 years and older:  A BMI below 18.5 is considered underweight.  A BMI of 18.5 to 24.9 is normal.  A BMI of 25 to 29.9 is considered overweight.  A BMI of 30 and above is considered obese.  Maintain normal blood lipids and cholesterol by exercising and minimizing your intake of saturated fat. Eat a balanced diet with plenty of fruits and vegetables. Blood tests for lipids and cholesterol should begin at age 53 and be repeated every 5 years. If your lipid or cholesterol levels are high, you are over age 54, or you are at high risk for heart disease, you may need your cholesterol levels checked more frequently.Ongoing high lipid and cholesterol levels should be treated with medicines if diet and exercise are not working.  If you smoke, find out from your health care provider how to quit. If you do not use tobacco, do not start.  Lung  cancer screening is recommended for adults aged 80-80 years who are at high risk for developing lung cancer because of a history of smoking. A yearly low-dose CT scan of the lungs is recommended for people who have at least a 30-pack-year history of smoking and are current smokers or have quit within the past 15 years. A pack year of smoking is smoking an average of 1 pack of cigarettes a day for 1 year (for example, a 30-pack-year history of smoking could mean smoking 1 pack a  day for 30 years or 2 packs a day for 15 years). Yearly screening should continue until the smoker has stopped smoking for at least 15 years. Yearly screening should be stopped for people who develop a health problem that would prevent them from having lung cancer treatment.  If you choose to drink alcohol, do not have more than 2 drinks per day. One drink is considered to be 12 oz (360 mL) of beer, 5 oz (150 mL) of wine, or 1.5 oz (45 mL) of liquor.  Avoid the use of street drugs. Do not share needles with anyone. Ask for help if you need support or instructions about stopping the use of drugs.  High blood pressure causes heart disease and increases the risk of stroke. High blood pressure is more likely to develop in:  People who have blood pressure in the end of the normal range (100-139/85-89 mm Hg).  People who are overweight or obese.  People who are African American.  If you are 29-54 years of age, have your blood pressure checked every 3-5 years. If you are 54 years of age or older, have your blood pressure checked every year. You should have your blood pressure measured twice--once when you are at a hospital or clinic, and once when you are not at a hospital or clinic. Record the average of the two measurements. To check your blood pressure when you are not at a hospital or clinic, you can use:  An automated blood pressure machine at a pharmacy.  A home blood pressure monitor.  If you are 24-93 years old, ask your health care provider if you should take aspirin to prevent heart disease.  Diabetes screening involves taking a blood sample to check your fasting blood sugar level. This should be done once every 3 years after age 45 if you are at a normal weight and without risk factors for diabetes. Testing should be considered at a younger age or be carried out more frequently if you are overweight and have at least 1 risk factor for diabetes.  Colorectal cancer can be detected and often  prevented. Most routine colorectal cancer screening begins at the age of 80 and continues through age 18. However, your health care provider may recommend screening at an earlier age if you have risk factors for colon cancer. On a yearly basis, your health care provider may provide home test kits to check for hidden blood in the stool. A small camera at the end of a tube may be used to directly examine the colon (sigmoidoscopy or colonoscopy) to detect the earliest forms of colorectal cancer. Talk to your health care provider about this at age 35 when routine screening begins. A direct exam of the colon should be repeated every 5-10 years through age 54, unless early forms of precancerous polyps or small growths are found.  People who are at an increased risk for hepatitis B should be screened for this virus. You are considered at  high risk for hepatitis B if:  You were born in a country where hepatitis B occurs often. Talk with your health care provider about which countries are considered high risk.  Your parents were born in a high-risk country and you have not received a shot to protect against hepatitis B (hepatitis B vaccine).  You have HIV or AIDS.  You use needles to inject street drugs.  You live with, or have sex with, someone who has hepatitis B.  You are a man who has sex with other men (MSM).  You get hemodialysis treatment.  You take certain medicines for conditions like cancer, organ transplantation, and autoimmune conditions.  Hepatitis C blood testing is recommended for all people born from [birth year] through 1967 and any individual with known risk factors for hepatitis C.  Healthy men should no longer receive prostate-specific antigen (PSA) blood tests as part of routine cancer screening. Talk to your health care provider about prostate cancer screening.  Testicular cancer screening is not recommended for adolescents or adult males who have no symptoms. Screening includes  self-exam, a health care provider exam, and other screening tests. Consult with your health care provider about any symptoms you have or any concerns you have about testicular cancer.  Practice safe sex. Use condoms and avoid high-risk sexual practices to reduce the spread of sexually transmitted infections (STIs).  You should be screened for STIs, including gonorrhea and chlamydia if:  You are sexually active and are younger than 24 years.  You are older than 24 years, and your health care provider tells you that you are at risk for this type of infection.  Your sexual activity has changed since you were last screened, and you are at an increased risk for chlamydia or gonorrhea. Ask your health care provider if you are at risk.  If you are at risk of being infected with HIV, it is recommended that you take a prescription medicine daily to prevent HIV infection. This is called pre-exposure prophylaxis (PrEP). You are considered at risk if:  You are a man who has sex with other men (MSM).  You are a heterosexual man who is sexually active with multiple partners.  You take drugs by injection.  You are sexually active with a partner who has HIV.  Talk with your health care provider about whether you are at high risk of being infected with HIV. If you choose to begin PrEP, you should first be tested for HIV. You should then be tested every 3 months for as long as you are taking PrEP.  Use sunscreen. Apply sunscreen liberally and repeatedly throughout the day. You should seek shade when your shadow is shorter than you. Protect yourself by wearing long sleeves, pants, a wide-brimmed hat, and sunglasses year round whenever you are outdoors.  Tell your health care provider of new moles or changes in moles, especially if there is a change in shape or color. Also, tell your health care provider if a mole is larger than the size of a pencil eraser.  A one-time screening for abdominal aortic aneurysm  (AAA) and surgical repair of large AAAs by ultrasound is recommended for men aged 9-75 years who are current or former smokers.  Stay current with your vaccines (immunizations).   This information is not intended to replace advice given to you by your health care provider. Make sure you discuss any questions you have with your health care provider.   Document Released: 06/13/2008 Document Revised: 01/06/2015 Document Reviewed:  05/13/2011 Elsevier Interactive Patient Education 2016 [place] Panosh M.D.

## 2016-06-25 ENCOUNTER — Ambulatory Visit (INDEPENDENT_AMBULATORY_CARE_PROVIDER_SITE_OTHER): Payer: Medicare Other | Admitting: Internal Medicine

## 2016-06-25 ENCOUNTER — Encounter: Payer: Self-pay | Admitting: Internal Medicine

## 2016-06-25 VITALS — BP 126/70 | Temp 98.1°F | Ht 72.0 in | Wt 323.9 lb

## 2016-06-25 DIAGNOSIS — Z7901 Long term (current) use of anticoagulants: Secondary | ICD-10-CM

## 2016-06-25 DIAGNOSIS — Z0000 Encounter for general adult medical examination without abnormal findings: Secondary | ICD-10-CM

## 2016-06-25 DIAGNOSIS — Z79899 Other long term (current) drug therapy: Secondary | ICD-10-CM

## 2016-06-25 DIAGNOSIS — E785 Hyperlipidemia, unspecified: Secondary | ICD-10-CM | POA: Diagnosis not present

## 2016-06-25 DIAGNOSIS — G479 Sleep disorder, unspecified: Secondary | ICD-10-CM

## 2016-06-25 DIAGNOSIS — E1159 Type 2 diabetes mellitus with other circulatory complications: Secondary | ICD-10-CM | POA: Diagnosis not present

## 2016-06-25 DIAGNOSIS — D696 Thrombocytopenia, unspecified: Secondary | ICD-10-CM

## 2016-06-25 DIAGNOSIS — Z23 Encounter for immunization: Secondary | ICD-10-CM | POA: Diagnosis not present

## 2016-06-25 NOTE — Patient Instructions (Signed)
Continue lifestyle intervention healthy eating and exercise . And ROV   in 3-4  Months  Will do   HG a1c at the visit  Consider  increasing to   4 -  500 mg    Per day ( max)  Avoid simple carbs read labels  For hidden   Sugars etc.  No cold cereals or calory beverages .   Wt Readings from Last 3 Encounters:  06/25/16 323 lb 14.4 oz (146.92 kg)  05/21/16 324 lb (146.965 kg)  04/11/16 324 lb 12.8 oz (147.328 kg)   pneumovax  Today   Health Maintenance, Male A healthy lifestyle and preventative care can promote health and wellness.  Maintain regular health, dental, and eye exams.  Eat a healthy diet. Foods like vegetables, fruits, whole grains, low-fat dairy products, and lean protein foods contain the nutrients you need and are low in calories. Decrease your intake of foods high in solid fats, added sugars, and salt. Get information about a proper diet from your health care provider, if necessary.  Regular physical exercise is one of the most important things you can do for your health. Most adults should get at least 150 minutes of moderate-intensity exercise (any activity that increases your heart rate and causes you to sweat) each week. In addition, most adults need muscle-strengthening exercises on 2 or more days a week.   Maintain a healthy weight. The body mass index (BMI) is a screening tool to identify possible weight problems. It provides an estimate of body fat based on height and weight. Your health care provider can find your BMI and can help you achieve or maintain a healthy weight. For males 20 years and older:  A BMI below 18.5 is considered underweight.  A BMI of 18.5 to 24.9 is normal.  A BMI of 25 to 29.9 is considered overweight.  A BMI of 30 and above is considered obese.  Maintain normal blood lipids and cholesterol by exercising and minimizing your intake of saturated fat. Eat a balanced diet with plenty of fruits and vegetables. Blood tests for lipids and  cholesterol should begin at age 55 and be repeated every 5 years. If your lipid or cholesterol levels are high, you are over age 33, or you are at high risk for heart disease, you may need your cholesterol levels checked more frequently.Ongoing high lipid and cholesterol levels should be treated with medicines if diet and exercise are not working.  If you smoke, find out from your health care provider how to quit. If you do not use tobacco, do not start.  Lung cancer screening is recommended for adults aged 67-80 years who are at high risk for developing lung cancer because of a history of smoking. A yearly low-dose CT scan of the lungs is recommended for people who have at least a 30-pack-year history of smoking and are current smokers or have quit within the past 15 years. A pack year of smoking is smoking an average of 1 pack of cigarettes a day for 1 year (for example, a 30-pack-year history of smoking could mean smoking 1 pack a day for 30 years or 2 packs a day for 15 years). Yearly screening should continue until the smoker has stopped smoking for at least 15 years. Yearly screening should be stopped for people who develop a health problem that would prevent them from having lung cancer treatment.  If you choose to drink alcohol, do not have more than 2 drinks per day. One drink  is considered to be 12 oz (360 mL) of beer, 5 oz (150 mL) of wine, or 1.5 oz (45 mL) of liquor.  Avoid the use of street drugs. Do not share needles with anyone. Ask for help if you need support or instructions about stopping the use of drugs.  High blood pressure causes heart disease and increases the risk of stroke. High blood pressure is more likely to develop in:  People who have blood pressure in the end of the normal range (100-139/85-89 mm Hg).  People who are overweight or obese.  People who are African American.  If you are 33-42 years of age, have your blood pressure checked every 3-5 years. If you are 42  years of age or older, have your blood pressure checked every year. You should have your blood pressure measured twice--once when you are at a hospital or clinic, and once when you are not at a hospital or clinic. Record the average of the two measurements. To check your blood pressure when you are not at a hospital or clinic, you can use:  An automated blood pressure machine at a pharmacy.  A home blood pressure monitor.  If you are 9-68 years old, ask your health care provider if you should take aspirin to prevent heart disease.  Diabetes screening involves taking a blood sample to check your fasting blood sugar level. This should be done once every 3 years after age 63 if you are at a normal weight and without risk factors for diabetes. Testing should be considered at a younger age or be carried out more frequently if you are overweight and have at least 1 risk factor for diabetes.  Colorectal cancer can be detected and often prevented. Most routine colorectal cancer screening begins at the age of 80 and continues through age 38. However, your health care provider may recommend screening at an earlier age if you have risk factors for colon cancer. On a yearly basis, your health care provider may provide home test kits to check for hidden blood in the stool. A small camera at the end of a tube may be used to directly examine the colon (sigmoidoscopy or colonoscopy) to detect the earliest forms of colorectal cancer. Talk to your health care provider about this at age 62 when routine screening begins. A direct exam of the colon should be repeated every 5-10 years through age 16, unless early forms of precancerous polyps or small growths are found.  People who are at an increased risk for hepatitis B should be screened for this virus. You are considered at high risk for hepatitis B if:  You were born in a country where hepatitis B occurs often. Talk with your health care provider about which countries  are considered high risk.  Your parents were born in a high-risk country and you have not received a shot to protect against hepatitis B (hepatitis B vaccine).  You have HIV or AIDS.  You use needles to inject street drugs.  You live with, or have sex with, someone who has hepatitis B.  You are a man who has sex with other men (MSM).  You get hemodialysis treatment.  You take certain medicines for conditions like cancer, organ transplantation, and autoimmune conditions.  Hepatitis C blood testing is recommended for all people born from [birth year] through 1967 and any individual with known risk factors for hepatitis C.  Healthy men should no longer receive prostate-specific antigen (PSA) blood tests as part of routine cancer  screening. Talk to your health care provider about prostate cancer screening.  Testicular cancer screening is not recommended for adolescents or adult males who have no symptoms. Screening includes self-exam, a health care provider exam, and other screening tests. Consult with your health care provider about any symptoms you have or any concerns you have about testicular cancer.  Practice safe sex. Use condoms and avoid high-risk sexual practices to reduce the spread of sexually transmitted infections (STIs).  You should be screened for STIs, including gonorrhea and chlamydia if:  You are sexually active and are younger than 24 years.  You are older than 24 years, and your health care provider tells you that you are at risk for this type of infection.  Your sexual activity has changed since you were last screened, and you are at an increased risk for chlamydia or gonorrhea. Ask your health care provider if you are at risk.  If you are at risk of being infected with HIV, it is recommended that you take a prescription medicine daily to prevent HIV infection. This is called pre-exposure prophylaxis (PrEP). You are considered at risk if:  You are a man who has sex with  other men (MSM).  You are a heterosexual man who is sexually active with multiple partners.  You take drugs by injection.  You are sexually active with a partner who has HIV.  Talk with your health care provider about whether you are at high risk of being infected with HIV. If you choose to begin PrEP, you should first be tested for HIV. You should then be tested every 3 months for as long as you are taking PrEP.  Use sunscreen. Apply sunscreen liberally and repeatedly throughout the day. You should seek shade when your shadow is shorter than you. Protect yourself by wearing long sleeves, pants, a wide-brimmed hat, and sunglasses year round whenever you are outdoors.  Tell your health care provider of new moles or changes in moles, especially if there is a change in shape or color. Also, tell your health care provider if a mole is larger than the size of a pencil eraser.  A one-time screening for abdominal aortic aneurysm (AAA) and surgical repair of large AAAs by ultrasound is recommended for men aged 46-75 years who are current or former smokers.  Stay current with your vaccines (immunizations).   This information is not intended to replace advice given to you by your health care provider. Make sure you discuss any questions you have with your health care provider.   Document Released: 06/13/2008 Document Revised: 01/06/2015 Document Reviewed: 05/13/2011 Elsevier Interactive Patient Education [employer].

## 2016-06-25 NOTE — Assessment & Plan Note (Signed)
A1c up to the 7 range on 1500 mg of metformin. Discussed increasing to 2000 and lifestyle intervention patient wants to intensify lifestyle and remain at 1500 mg a day. Okay to do this and follow-up in 3-4 months. We can do A1c test at the office visit. Reviewed strategies.

## 2016-06-30 ENCOUNTER — Other Ambulatory Visit: Payer: Self-pay | Admitting: Internal Medicine

## 2016-07-01 NOTE — Telephone Encounter (Signed)
Sent to the pharmacy by e-scribe. 

## 2016-07-03 ENCOUNTER — Other Ambulatory Visit: Payer: Self-pay | Admitting: Internal Medicine

## 2016-07-03 NOTE — Telephone Encounter (Signed)
Sent to the pharmacy by e-scribe. 

## 2016-07-11 ENCOUNTER — Other Ambulatory Visit: Payer: Self-pay | Admitting: Internal Medicine

## 2016-07-11 NOTE — Telephone Encounter (Signed)
Sent to the pharmacy by e-scribe. 

## 2016-07-23 ENCOUNTER — Encounter: Payer: Self-pay | Admitting: Internal Medicine

## 2016-07-27 ENCOUNTER — Other Ambulatory Visit: Payer: Self-pay | Admitting: Cardiology

## 2016-07-29 NOTE — Telephone Encounter (Signed)
Rx(s) sent to pharmacy electronically.  

## 2016-07-31 ENCOUNTER — Other Ambulatory Visit: Payer: Self-pay | Admitting: Vascular Surgery

## 2016-07-31 LAB — CREATININE, SERUM: Creat: 0.9 mg/dL (ref 0.70–1.25)

## 2016-07-31 LAB — BUN: BUN: 15 mg/dL (ref 7–25)

## 2016-08-07 ENCOUNTER — Ambulatory Visit
Admission: RE | Admit: 2016-08-07 | Discharge: 2016-08-07 | Disposition: A | Discharge status: Home / Self Care | Payer: Medicare Other | Source: Ambulatory Visit | Attending: Vascular Surgery | Admitting: Vascular Surgery

## 2016-08-07 DIAGNOSIS — I723 Aneurysm of iliac artery: Secondary | ICD-10-CM

## 2016-08-07 MED ORDER — IOPAMIDOL (ISOVUE-370) INJECTION 76%
100.0000 mL | Freq: Once | INTRAVENOUS | Status: AC | PRN
  Administered 2016-08-07: 100 mL via INTRAVENOUS

## 2016-08-08 ENCOUNTER — Encounter: Payer: Self-pay | Admitting: Family

## 2016-08-14 ENCOUNTER — Encounter: Payer: Self-pay | Admitting: Family

## 2016-08-14 ENCOUNTER — Ambulatory Visit (INDEPENDENT_AMBULATORY_CARE_PROVIDER_SITE_OTHER): Payer: Medicare Other | Admitting: Family

## 2016-08-14 VITALS — BP 127/69 | HR 57 | Temp 98.3°F | Resp 16 | Ht 73.0 in | Wt 321.0 lb

## 2016-08-14 DIAGNOSIS — I723 Aneurysm of iliac artery: Secondary | ICD-10-CM

## 2016-08-14 DIAGNOSIS — Z87891 Personal history of nicotine dependence: Secondary | ICD-10-CM

## 2016-08-14 NOTE — Progress Notes (Signed)
[facility] [place]   CC: Follow up Abdominal Aortic Aneurysm  History of Present Illness  [name] [name]. is a 65 y.o. (1951/11/22) male patient of Dr. Scot Dock who had appendicitis about 2015  with rupture and underwent surgery for this.  He did have an open wound that required wet to dry dressings at that time.  This has healed.  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  He has not had any abdominal pain or back pain.  He does chronic back pain as he has had multiple back surgeries in the past.    There is no family hx of aneurysms.   He denies any claudication type symptoms, but states he does have some numbness on the ball of his left foot and to his 2nd toe and states this is from his back issues.    He also has a history of PAF with palpitations as recently as yesterday.  He did go to the ER where he underwent a cardioversion, which was successful.  He has PAF since 2005 and has undergone cardioversion x 9.  He is on tikosyn and Pradaxa for this.  He does have hx of renal cyst on the right kidney that was followed with ultrasound.    He does have a hx of diabetes and he does take Metformin for this.  He is on a statin for cholesterol management.  He does not take any medication for hypertension as he has normal blood pressure.    The patient denies claudication in legs with walking. The patient denies any history of stroke or TIA symptoms.  Pt Diabetic: Yes, 6.8-7.0 A1C per pt Pt smoker: former smoker, quit in 2010, smoked 40 years   Past Medical History:  Diagnosis Date  . Atrial fibrillation ([facility]) 03/22/2009   a. s/p multiple DCCV; b. no coumadin due to low TE risk profile; c. Tikosyn Rx  . Coronary atherosclerosis of native coronary artery 11/2002   a. s/p stent to LAD 12/03; OM2 occluded at cath 12/03; d. myoview 5/10: no ischemia;  e. echo 7/11: EF 55%, BAE, mild RVE, PASP 41-45; Myoview was in March 2013.  There was no ischemia or infarction, EF 51%   . Diabetes mellitus without complication ([facility])   . Drusen body    see opth note  . ERECTILE DYSFUNCTION 03/22/2009  . GERD 03/22/2009  . HYPERGLYCEMIA 04/25/2010  . HYPERLIPIDEMIA 03/22/2009  . 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 LATERAL EPICONDYLITIS, LEFT 10/24/2009  . LIVER FUNCTION TESTS, ABNORMAL 04/25/2010  . Local reaction to immunization 05/05/2012   minor resolving  zostavax   . Myocardial infarction ([facility]) mi2003  . Numbness in left leg    foot related to back disease and surgery  . Obesity, unspecified 04/24/2009  . Perforated appendicitis with necrosis s/p open appendectomy 06/07/14 06/04/2014  . Renal cyst    Characterized by MRI as simple  . Ruptured suppurative appendicitis    2015   . SLEEP APNEA, OBSTRUCTIVE 03/22/2009   compliant with CPAP  . THROMBOCYTOPENIA 08/16/2010  . TOBACCO USE, QUIT 10/24/2009  . 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 Umbilical hernia    Past Surgical History:  Procedure Laterality Date  . APPENDECTOMY N/A 06/06/2014   Procedure: APPENDECTOMY;  Surgeon: Odis Hollingshead, MD;  Location: [facility];  Service: General;  Laterality: N/A;  . COLON SURGERY    .  COLONOSCOPY  11/15/2011   Procedure: COLONOSCOPY;  Surgeon: Juanita Craver, MD;  Location: [facility];  Service: Endoscopy;  Laterality: N/A;  . cyst on epiglottis  08/2002  . ESOPHAGOGASTRODUODENOSCOPY  11/15/2011   Procedure: ESOPHAGOGASTRODUODENOSCOPY (EGD);  Surgeon: Juanita Craver, MD;  Location: [facility];  Service: Endoscopy;  Laterality: N/A;  . LAPAROSCOPIC APPENDECTOMY N/A 06/06/2014   Procedure: APPENDECTOMY LAPAROSCOPIC attemted;  Surgeon: Odis Hollingshead, MD;  Location: [facility];  Service: General;  Laterality: N/A;  . LUMBAR DISC SURGERY     two  holes in spinalcovering  . stent     LAD DUS 2004  . surgery l4-l5   1998   ruptured x 3  . ulnar neuropathy    . UMBILICAL HERNIA REPAIR     mesh   Social  History Social History   Social History  . Marital status: Married    Spouse name: N/A  . Number of children: N/A  . Years of education: N/A   Occupational History  . Not on file.   Social History Main Topics  . Smoking status: Former Smoker    Packs/day: 2.00    Years: 42.00    Types: Cigarettes    Quit date: 12/30/2008  . Smokeless tobacco: Never Used     Comment: started at age 73; 1-2 ppd; quit 2010  . Alcohol use No  . Drug use: No  . Sexual activity: Yes   Other Topics Concern  . Not on file   Social History Narrative   Retired paramedic   Regular exercise-yes not recently    Has children   Wife is overweight and has fibromyalgia and depression on disability doesn't go out much. Back surgery    Has older dog   Retired from [employer] 31 years of service now 7 years    Family History Family History  Problem Relation Age of Onset  . Thyroid disease Mother   . Ovarian cancer Mother   . Breast cancer Mother   . Lung cancer Father   . Cancer Father     Current Outpatient Prescriptions on File Prior to Visit  Medication Sig Dispense Refill  . acetaminophen (TYLENOL) 325 MG tablet Take 650 mg by mouth every 6 (six) hours as needed for mild pain.    [name] albuterol (PROVENTIL HFA;VENTOLIN HFA) 108 (90 BASE) MCG/ACT inhaler Inhale 2 puffs into the lungs every 6 (six) hours as needed for wheezing or shortness of breath. 1 Inhaler 1  . atorvastatin (LIPITOR) 80 MG tablet Take 1 tablet by mouth  daily 90 tablet 3  . BELSOMRA 20 MG TABS TAKE 1 TABLET BY MOUTH EVERY DAY AT BEDTIME 30 tablet 5  . Blood Glucose Monitoring Suppl (ONETOUCH VERIO IQ SYSTEM) W/DEVICE KIT Use daily for monitoring blood glucose    . diphenhydrAMINE (SOMINEX) 25 MG tablet Take 25 mg by mouth at bedtime as needed for sleep.    [name] dofetilide (TIKOSYN) 500 MCG capsule Take 1 capsule by mouth two times daily 180 capsule 3  . fish oil-omega-3 fatty acids 1000 MG capsule Take 1 g by mouth 2 (two) times daily.     [name]  KLOR-CON M20 20 MEQ tablet TAKE 1 TABLET BY MOUTH EVERY DAY 90 tablet 2  . MAGNESIUM OXIDE PO Take 750 mg by mouth every evening.     . metFORMIN (GLUCOPHAGE-XR) 500 MG 24 hr tablet Take 3 tablets by mouth  daily with breakfast 270 tablet 1  . MULTIPLE VITAMIN PO Take 1 tablet by  mouth every evening.     . nitroGLYCERIN (NITROSTAT) 0.4 MG SL tablet Place 0.4 mg under the tongue every 5 (five) minutes as needed for chest pain.    [name] DELICA LANCETS 75I MISC USE AS DIRECTED 100 each 11  . ONETOUCH VERIO test strip USE TWICE A DAY 50 each 11  . ONETOUCH VERIO test strip USE TWICE A DAY 50 each 12  . pantoprazole (PROTONIX) 40 MG tablet TAKE 1 TABLET (40 MG TOTAL) BY MOUTH DAILY. 90 tablet 1  . PRADAXA 150 MG CAPS capsule Take 1 capsule by mouth  every 12 hours 180 capsule 1   No current facility-administered medications on file prior to visit.    Allergies  Allergen Reactions  . Penicillins     Childhood reaction   . Procaine Hcl Hives and Other (See Comments)    Red in the face and neck   . Pseudoephedrine Other (See Comments)    Patient went into afib  . Sulfonamide Derivatives     Childhood reaction   . Cardizem [Diltiazem] Rash    ROS: See HPI for pertinent positives and negatives.  Physical Examination  Vitals:   08/14/16 1128  BP: 127/69  Pulse: (!) 57  Resp: 16  Temp: 98.3 F (36.8 C)  SpO2: 93%  Weight: (!) 321 lb (145.6 kg)  Height: 6' 1"  (1.854 m)   Body mass index is 42.35 kg/m.  General: A&O x 3, WD, morbidly obese male.  Pulmonary: Sym exp, respirations are non labored, distant breath sounds, CTAB Cardiac: RRR, Nl S1, S2, no detected murmur.   Carotid Bruits Right Left   Negative Negative   Aorta is not palpable Radial pulses are 2+ palpable and =                          VASCULAR EXAM:                                                                                                         LE Pulses Right Left       FEMORAL  not palpable  (obese)  not palpable (obese)        POPLITEAL  not palpable   not palpable       POSTERIOR TIBIAL  not palpable   not palpable        DORSALIS PEDIS      ANTERIOR TIBIAL  palpable   palpable     Gastrointestinal: soft, NTND, -G/R, - HSM, - masses palpated, - CVAT B, large panus.  Musculoskeletal: M/S 5/5 throughout, Extremities without ischemic changes.  Neurologic: CN 2-12 intact, Pain and light touch intact in extremities are intact except, Motor exam as listed above.   08/07/16 CTA abd/pelvis: Moderate amount of atherosclerotic plaque within a normal caliber abdominal aorta, not resulting in a hemodynamically significant stenosis. Aortic Atherosclerosis (ICD10-170.0) 2. Unchanged mild fusiform ectasia of the right common and internal iliac arteries measuring approximately 2.3 cm and 1.4 cm in diameter respectively, similar to the 05/2014 examination.  Medical Decision Making  The patient is a 65 y.o. male who presents with an asymptomatic right CIA aneurysm with no significant increase in size: 2.3 cm today. 2.1 cm was largest measured diameter on CTA in August 2016. Pt states he has had ultrasounds of his gallbladder in the past, and states there had been no problem identifying what was under evaluation. Pt requests that an abdominal duplex be attempted instead of a CTA, and if that does provide much useful information, then a CTA abd/pelvis will be scheduled.  His blood pressure remains in good control and he quit tobacco use in 2010, with a 40 year history of tobacco use.    Based on this patient's exam and diagnostic studies, the patient will follow up in 1 year  with the following studies: bilateral aortoiliac duplex.  Consideration for repair of a common iliac artery aneurysm would be made when the size is 3.5 cm, and symptomatic status.  I emphasized the importance of maximal medical management including strict control of blood pressure, blood glucose, and lipid  levels, antiplatelet agents, obtaining regular exercise, and continued cessation of smoking.   The patient was advised to call 911 should the patient experience sudden onset abdominal or back pain.   Thank you for allowing [place] to participate in this patient's care.  Clemon Chambers, RN, MSN, FNP-C [facility] [place] Office: [phone]  Clinic Physician: Scot Dock  08/14/2016, 11:55 AM

## 2016-08-30 NOTE — Addendum Note (Signed)
Addended by: Thresa Ross C on: 08/30/2016 02:17 PM   Modules accepted: Orders

## 2016-09-12 ENCOUNTER — Encounter: Payer: Self-pay | Admitting: Internal Medicine

## 2016-09-13 NOTE — Telephone Encounter (Signed)
[name] ok to do refill 90 days when request comes through

## 2016-09-16 MED ORDER — SUVOREXANT 20 MG PO TABS: 1.0000 | ORAL_TABLET | Freq: Every day | ORAL | 0 refills | Status: DC

## 2016-09-29 NOTE — Progress Notes (Signed)
Chief Complaint  Patient presents with  . Follow-up    diabetic  . Flu Vaccine    HPI: [name] [name]. 65 y.o.  Fu o f.mmi but specifically diabetes    1500 metfromin with lsi  Since his last visit he's been paying attention to portion size E more salads vinaigrette instead of creamy and tried to lose weight. He has been cooking last for his wife who is been ill. This has helped him. He is to get an eye exam. Has had no falls and discussed fall prevention he gets a rebate for that. He has a follow-up with cardiology in December. No active bleeding. ROS: See pertinent positives and negatives per HPI.  Past Medical History:  Diagnosis Date  . Atrial fibrillation ([place]) 03/22/2009   a. s/p multiple DCCV; b. no coumadin due to low TE risk profile; c. Tikosyn Rx  . Coronary atherosclerosis of native coronary artery 11/2002   a. s/p stent to LAD 12/03; OM2 occluded at cath 12/03; d. myoview 5/10: no ischemia;  e. echo 7/11: EF 55%, BAE, mild RVE, PASP 41-45; Myoview was in March 2013. There was no ischemia or infarction, EF 51%   . Diabetes mellitus without complication ([place])   . Drusen body    see opth note  . ERECTILE DYSFUNCTION 03/22/2009  . GERD 03/22/2009  . HYPERGLYCEMIA 04/25/2010  . HYPERLIPIDEMIA 03/22/2009  . 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 LATERAL EPICONDYLITIS, LEFT 10/24/2009  . LIVER FUNCTION TESTS, ABNORMAL 04/25/2010  . Local reaction to immunization 05/05/2012   minor resolving  zostavax   . Myocardial infarction mi2003  . Numbness in left leg    foot related to back disease and surgery  . Obesity, unspecified 04/24/2009  . Perforated appendicitis with necrosis s/p open appendectomy 06/07/14 06/04/2014  . Renal cyst    Characterized by MRI as simple  . Ruptured suppurative appendicitis    2015   . SLEEP APNEA, OBSTRUCTIVE 03/22/2009   compliant with CPAP  . THROMBOCYTOPENIA 08/16/2010  . TOBACCO USE, QUIT 10/24/2009  . ULNAR  NEUROPATHY, LEFT 03/22/2009  . Umbilical hernia     Family History  Problem Relation Age of Onset  . Thyroid disease Mother   . Ovarian cancer Mother   . Breast cancer Mother   . Lung cancer Father   . Cancer Father     Social History   Social History  . Marital status: Married    Spouse name: N/A  . Number of children: N/A  . Years of education: N/A   Social History Main Topics  . Smoking status: Former Smoker    Packs/day: 2.00    Years: 42.00    Types: Cigarettes    Quit date: 12/30/2008  . Smokeless tobacco: Never Used     Comment: started at age 30; 1-2 ppd; quit 2010  . Alcohol use No  . Drug use: No  . Sexual activity: Yes   Other Topics Concern  . None   Social History Narrative   Retired paramedic   Regular exercise-yes not recently    Has children   Wife is overweight and has fibromyalgia and depression on disability doesn't go out much. Back surgery    Has older dog   Retired from [employer] 31 years of service now 7 years     Outpatient Medications Prior to Visit  Medication Sig Dispense Refill  . acetaminophen (TYLENOL) 325 MG tablet Take  650 mg by mouth every 6 (six) hours as needed for mild pain.    [name] albuterol (PROVENTIL HFA;VENTOLIN HFA) 108 (90 BASE) MCG/ACT inhaler Inhale 2 puffs into the lungs every 6 (six) hours as needed for wheezing or shortness of breath. 1 Inhaler 1  . atorvastatin (LIPITOR) 80 MG tablet Take 1 tablet by mouth  daily 90 tablet 3  . Blood Glucose Monitoring Suppl (ONETOUCH VERIO IQ SYSTEM) W/DEVICE KIT Use daily for monitoring blood glucose    . diphenhydrAMINE (SOMINEX) 25 MG tablet Take 25 mg by mouth at bedtime as needed for sleep.    [name] dofetilide (TIKOSYN) 500 MCG capsule Take 1 capsule by mouth two times daily 180 capsule 3  . fish oil-omega-3 fatty acids 1000 MG capsule Take 1 g by mouth 2 (two) times daily.     [name] KLOR-CON M20 20 MEQ tablet TAKE 1 TABLET BY MOUTH EVERY DAY 90 tablet 2  . MAGNESIUM OXIDE PO Take 750 mg by  mouth every evening.     . metFORMIN (GLUCOPHAGE-XR) 500 MG 24 hr tablet Take 3 tablets by mouth  daily with breakfast 270 tablet 1  . MULTIPLE VITAMIN PO Take 1 tablet by mouth every evening.     . nitroGLYCERIN (NITROSTAT) 0.4 MG SL tablet Place 0.4 mg under the tongue every 5 (five) minutes as needed for chest pain.    [name] DELICA LANCETS 97Q MISC USE AS DIRECTED 100 each 11  . ONETOUCH VERIO test strip USE TWICE A DAY 50 each 11  . pantoprazole (PROTONIX) 40 MG tablet TAKE 1 TABLET (40 MG TOTAL) BY MOUTH DAILY. 90 tablet 1  . PRADAXA 150 MG CAPS capsule Take 1 capsule by mouth  every 12 hours 180 capsule 1  . Suvorexant (BELSOMRA) 20 MG TABS Take 1 tablet by mouth at bedtime. 90 tablet 0  . ONETOUCH VERIO test strip USE TWICE A DAY 50 each 12   No facility-administered medications prior to visit.      EXAM:  BP 120/60 (BP Location: Left Arm, Patient Position: Sitting, Cuff Size: Large)   Pulse 70   Temp 97.9 F (36.6 C)   Resp 16   Ht 6' 1"  (1.854 m)   Wt (!) 319 lb (144.7 kg)   SpO2 96%   BMI 42.09 kg/m   Body mass index is 42.09 kg/m.  GENERAL: vitals reviewed and listed above, alert, oriented, appears well hydrated and in no acute distress HEENT: atraumatic, conjunctiva  clear, no obvious abnormalities on inspection of external nose and earsMS: moves all extremities without noticeable focal  abnormality PSYCH: pleasant and cooperative, no obvious depression or anxiety Wt Readings from Last 3 Encounters:  09/30/16 (!) 319 lb (144.7 kg)  08/14/16 (!) 321 lb (145.6 kg)  06/25/16 (!) 323 lb 14.4 oz (146.9 kg)   Lab Results  Component Value Date   WBC 8.0 06/18/2016   HGB 16.6 06/18/2016   HCT 48.5 06/18/2016   PLT 134.0 (L) 06/18/2016   GLUCOSE 133 (H) 06/18/2016   CHOL 96 06/18/2016   TRIG 128.0 06/18/2016   HDL 30.10 (L) 06/18/2016   LDLCALC 40 06/18/2016   ALT 46 06/18/2016   AST 26 06/18/2016   NA 140 06/18/2016   K 4.0 06/18/2016   CL 104 06/18/2016    CREATININE 0.90 07/31/2016   BUN 15 07/31/2016   CO2 28 06/18/2016   TSH 2.85 06/18/2016   PSA 0.80 06/18/2016   INR 1.27 06/05/2014   HGBA1C 7.0 (H)  06/18/2016   MICROALBUR 9.6 (H) 06/18/2016   Latest hemoglobin A1c was 6.7. Not yet data entered.  Down  from 7.0   ASSESSMENT AND PLAN:  Discussed the following assessment and plan:  Type 2 diabetes mellitus with other circulatory complications - Improved control continue healthy weight loss stay on same regimen. Follow-up in about 4 months A1c visit.  Medication management  Morbid obesity, unspecified obesity type ([place]) - Continue healthy weight loss.  Encounter for immunization - Plan: Flu vaccine HIGH DOSE PF Flu vaccine today  Fall prevention discussed. Total visit 30mns > 50% spent counseling and coordinating care as indicated in above note and in instructions to patient .     -Patient advised to return or notify health care team  if symptoms worsen ,persist or new concerns arise.  Patient Instructions  Keep going with   Your healthy weight.   loss ..[name] This is helping your blood sugar  Control.   ROV in 4 months and a1c at the visit    [place] Prevention in [place] cause injuries and can affect people from all age groups. There are many simple things that you can do to make your home safe and to help prevent falls. WHAT CAN I DO ON THE OUTSIDE OF MY HOME?  Regularly repair the edges of walkways and driveways and fix any cracks.  Remove high doorway thresholds.  Trim any shrubbery on the main path into your home.  Use bright outdoor lighting.  Clear walkways of debris and clutter, including tools and rocks.  Regularly check that handrails are securely fastened and in good repair. Both sides of any steps should have handrails.  Install guardrails along the edges of any raised decks or porches.  Have leaves, snow, and ice cleared regularly.  Use sand or salt on walkways during winter months.  In  the garage, clean up any spills right away, including grease or oil spills. WHAT CAN I DO IN THE BATHROOM?  Use night lights.  Install grab bars by the toilet and in the tub and shower. Do not use towel bars as grab bars.  Use non-skid mats or decals on the floor of the tub or shower.  If you need to sit down while you are in the shower, use a plastic, non-slip stool..[name] Keep the floor dry. Immediately clean up any water that spills on the floor.  Remove soap buildup in the tub or shower on a regular basis.  Attach bath mats securely with double-sided non-slip rug tape.  Remove throw rugs and other tripping hazards from the floor. WHAT CAN I DO IN THE BEDROOM?  Use night lights.  Make sure that a bedside light is easy to reach.  Do not use oversized bedding that drapes onto the floor.  Have a firm chair that has side arms to use for getting dressed.  Remove throw rugs and other tripping hazards from the floor. WHAT CAN I DO IN THE KITCHEN?   Clean up any spills right away.  Avoid walking on wet floors.  Place frequently used items in easy-to-reach places.  If you need to reach for something above you, use a sturdy step stool that has a grab bar.  Keep electrical cables out of the way.  Do not use floor polish or wax that makes floors slippery. If you have to use wax, make sure that it is non-skid floor wax.  Remove throw rugs and other tripping hazards from the floor.  WHAT CAN I DO IN THE STAIRWAYS?  Do not leave any items on the stairs.  Make sure that there are handrails on both sides of the stairs. Fix handrails that are broken or loose. Make sure that handrails are as long as the stairways.  Check any carpeting to make sure that it is firmly attached to the stairs. Fix any carpet that is loose or worn.  Avoid having throw rugs at the top or bottom of stairways, or secure the rugs with carpet tape to prevent them from moving.  Make sure that you have a light  switch at the top of the stairs and the bottom of the stairs. If you do not have them, have them installed. WHAT ARE SOME OTHER FALL PREVENTION TIPS?  Wear closed-toe shoes that fit well and support your feet. Wear shoes that have rubber soles or low heels.  When you use a stepladder, make sure that it is completely opened and that the sides are firmly locked. Have someone hold the ladder while you are using it. Do not climb a closed stepladder.  Add color or contrast paint or tape to grab bars and handrails in your home. Place contrasting color strips on the first and last steps.  Use mobility aids as needed, such as canes, walkers, scooters, and crutches.  Turn on lights if it is dark. Replace any light bulbs that burn out.  Set up furniture so that there are clear paths. Keep the furniture in the same spot.  Fix any uneven floor surfaces.  Choose a carpet design that does not hide the edge of steps of a stairway.  Be aware of any and all pets.  Review your medicines with your healthcare provider. Some medicines can cause dizziness or changes in blood pressure, which increase your risk of falling. Talk with your health care provider about other ways that you can decrease your risk of falls. This may include working with a physical therapist or trainer to improve your strength, balance, and endurance.   This information is not intended to replace advice given to you by your health care provider. Make sure you discuss any questions you have with your health care provider.   Document Released: 12/06/2002 Document Revised: 05/02/2015 Document Reviewed: 01/20/2015 Elsevier Interactive Patient Education 2016 [place] K. Panosh M.D.

## 2016-09-30 ENCOUNTER — Ambulatory Visit (INDEPENDENT_AMBULATORY_CARE_PROVIDER_SITE_OTHER): Payer: Medicare Other | Admitting: Internal Medicine

## 2016-09-30 ENCOUNTER — Encounter: Payer: Self-pay | Admitting: Internal Medicine

## 2016-09-30 VITALS — BP 120/60 | HR 70 | Temp 97.9°F | Resp 16 | Ht 73.0 in | Wt 319.0 lb

## 2016-09-30 DIAGNOSIS — Z23 Encounter for immunization: Secondary | ICD-10-CM

## 2016-09-30 DIAGNOSIS — Z79899 Other long term (current) drug therapy: Secondary | ICD-10-CM

## 2016-09-30 DIAGNOSIS — E1159 Type 2 diabetes mellitus with other circulatory complications: Secondary | ICD-10-CM

## 2016-09-30 LAB — POCT GLYCOSYLATED HEMOGLOBIN (HGB A1C): Hemoglobin A1C: 6.7

## 2016-09-30 NOTE — Addendum Note (Signed)
Addended by: Precious Gilding on: 09/30/2016 01:53 PM   Modules accepted: Orders

## 2016-09-30 NOTE — Patient Instructions (Addendum)
Keep going with   Your healthy weight.   loss .[name]  This is helping your blood sugar  Control.   ROV in 4 months and a1c at the visit    Fall Prevention in the [place] can cause injuries and can affect people from all age groups. There are many simple things that you can do to make your home safe and to help prevent falls. WHAT CAN I DO ON THE OUTSIDE OF MY HOME?  Regularly repair the edges of walkways and driveways and fix any cracks.  Remove high doorway thresholds.  Trim any shrubbery on the main path into your home.  Use bright outdoor lighting.  Clear walkways of debris and clutter, including tools and rocks.  Regularly check that handrails are securely fastened and in good repair. Both sides of any steps should have handrails.  Install guardrails along the edges of any raised decks or porches.  Have leaves, snow, and ice cleared regularly.  Use sand or salt on walkways during winter months.  In the garage, clean up any spills right away, including grease or oil spills. WHAT CAN I DO IN THE BATHROOM?  Use night lights.  Install grab bars by the toilet and in the tub and shower. Do not use towel bars as grab bars.  Use non-skid mats or decals on the floor of the tub or shower.  If you need to sit down while you are in the shower, use a plastic, non-slip stool.[name]  Keep the floor dry. Immediately clean up any water that spills on the floor.  Remove soap buildup in the tub or shower on a regular basis.  Attach bath mats securely with double-sided non-slip rug tape.  Remove throw rugs and other tripping hazards from the floor. WHAT CAN I DO IN THE BEDROOM?  Use night lights.  Make sure that a bedside light is easy to reach.  Do not use oversized bedding that drapes onto the floor.  Have a firm chair that has side arms to use for getting dressed.  Remove throw rugs and other tripping hazards from the floor. WHAT CAN I DO IN THE KITCHEN?   Clean up any spills  right away.  Avoid walking on wet floors.  Place frequently used items in easy-to-reach places.  If you need to reach for something above you, use a sturdy step stool that has a grab bar.  Keep electrical cables out of the way.  Do not use floor polish or wax that makes floors slippery. If you have to use wax, make sure that it is non-skid floor wax.  Remove throw rugs and other tripping hazards from the floor. WHAT CAN I DO IN THE STAIRWAYS?  Do not leave any items on the stairs.  Make sure that there are handrails on both sides of the stairs. Fix handrails that are broken or loose. Make sure that handrails are as long as the stairways.  Check any carpeting to make sure that it is firmly attached to the stairs. Fix any carpet that is loose or worn.  Avoid having throw rugs at the top or bottom of stairways, or secure the rugs with carpet tape to prevent them from moving.  Make sure that you have a light switch at the top of the stairs and the bottom of the stairs. If you do not have them, have them installed. WHAT ARE SOME OTHER FALL PREVENTION TIPS?  Wear closed-toe shoes that fit well and support your feet. Wear  shoes that have rubber soles or low heels.  When you use a stepladder, make sure that it is completely opened and that the sides are firmly locked. Have someone hold the ladder while you are using it. Do not climb a closed stepladder.  Add color or contrast paint or tape to grab bars and handrails in your home. Place contrasting color strips on the first and last steps.  Use mobility aids as needed, such as canes, walkers, scooters, and crutches.  Turn on lights if it is dark. Replace any light bulbs that burn out.  Set up furniture so that there are clear paths. Keep the furniture in the same spot.  Fix any uneven floor surfaces.  Choose a carpet design that does not hide the edge of steps of a stairway.  Be aware of any and all pets.  Review your medicines  with your healthcare provider. Some medicines can cause dizziness or changes in blood pressure, which increase your risk of falling. Talk with your health care provider about other ways that you can decrease your risk of falls. This may include working with a physical therapist or trainer to improve your strength, balance, and endurance.   This information is not intended to replace advice given to you by your health care provider. Make sure you discuss any questions you have with your health care provider.   Document Released: 12/06/2002 Document Revised: 05/02/2015 Document Reviewed: 01/20/2015 Elsevier Interactive Patient Education [employer].

## 2016-10-02 LAB — HM DIABETES EYE EXAM

## 2016-10-08 ENCOUNTER — Encounter: Payer: Self-pay | Admitting: Family Medicine

## 2016-10-31 ENCOUNTER — Other Ambulatory Visit: Payer: Self-pay | Admitting: Internal Medicine

## 2016-11-01 ENCOUNTER — Other Ambulatory Visit: Payer: Self-pay | Admitting: Internal Medicine

## 2016-11-01 NOTE — Telephone Encounter (Signed)
Sent to the pharmacy by e-scribe. 

## 2016-11-04 NOTE — Telephone Encounter (Signed)
Sent to the pharmacy by e-scribe. 

## 2016-11-12 NOTE — Progress Notes (Signed)
HPI: FU hx of CAD s/p stent to LAD in 2003, parox AFib, HL, OSA. He underwent DCCV in 05/2012 for AFib. He has seen Dr. Thompson Grayer and ablation could be considered if he lost weight. Previous rash with cardizem; on tikosyn. Patient had recurrent atrial fibrillation in January 2017. He was cardioverted. Echo 6/17 showed normal function; grade 1 DD; mild LAE. Iliac aneurysm followed by vascular surgery. Since last seen, the patient denies any dyspnea on exertion, orthopnea, PND, pedal edema, palpitations, syncope or chest pain.   Current Outpatient Prescriptions  Medication Sig Dispense Refill  . acetaminophen (TYLENOL) 325 MG tablet Take 650 mg by mouth every 6 (six) hours as needed for mild pain.    [name] albuterol (PROVENTIL HFA;VENTOLIN HFA) 108 (90 BASE) MCG/ACT inhaler Inhale 2 puffs into the lungs every 6 (six) hours as needed for wheezing or shortness of breath. 1 Inhaler 1  . atorvastatin (LIPITOR) 80 MG tablet Take 1 tablet by mouth  daily 90 tablet 3  . Blood Glucose Monitoring Suppl (ONETOUCH VERIO IQ SYSTEM) W/DEVICE KIT Use daily for monitoring blood glucose    . diphenhydrAMINE (SOMINEX) 25 MG tablet Take 25 mg by mouth at bedtime as needed for sleep.    [name] dofetilide (TIKOSYN) 500 MCG capsule Take 1 capsule by mouth two times daily 180 capsule 3  . fish oil-omega-3 fatty acids 1000 MG capsule Take 1 g by mouth 2 (two) times daily.     [name] KLOR-CON M20 20 MEQ tablet TAKE 1 TABLET BY MOUTH EVERY DAY 90 tablet 2  . MAGNESIUM OXIDE PO Take 750 mg by mouth every evening.     . metFORMIN (GLUCOPHAGE-XR) 500 MG 24 hr tablet Take 3 tablets by mouth  daily with breakfast 270 tablet 1  . MULTIPLE VITAMIN PO Take 1 tablet by mouth every evening.     . nitroGLYCERIN (NITROSTAT) 0.4 MG SL tablet Place 0.4 mg under the tongue every 5 (five) minutes as needed for chest pain.    [name] DELICA LANCETS 78I MISC USE TO TEST BLOOD GLUCOSE TWICE DAILY 100 each 6  . ONETOUCH VERIO test strip USE  TWICE A DAY 50 each 11  . pantoprazole (PROTONIX) 40 MG tablet TAKE 1 TABLET (40 MG TOTAL) BY MOUTH DAILY. 90 tablet 1  . PRADAXA 150 MG CAPS capsule TAKE 1 CAPSULE BY MOUTH  EVERY 12 HOURS 180 capsule 1  . Suvorexant (BELSOMRA) 20 MG TABS Take 1 tablet by mouth at bedtime. 90 tablet 0   No current facility-administered medications for this visit.      Past Medical History:  Diagnosis Date  . Atrial fibrillation ([place]) 03/22/2009   a. s/p multiple DCCV; b. no coumadin due to low TE risk profile; c. Tikosyn Rx  . Coronary atherosclerosis of native coronary artery 11/2002   a. s/p stent to LAD 12/03; OM2 occluded at cath 12/03; d. myoview 5/10: no ischemia;  e. echo 7/11: EF 55%, BAE, mild RVE, PASP 41-45; Myoview was in March 2013. There was no ischemia or infarction, EF 51%   . Diabetes mellitus without complication ([place])   . Drusen body    see opth note  . ERECTILE DYSFUNCTION 03/22/2009  . GERD 03/22/2009  . HYPERGLYCEMIA 04/25/2010  . HYPERLIPIDEMIA 03/22/2009  . 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 LATERAL EPICONDYLITIS, LEFT 10/24/2009  . LIVER FUNCTION TESTS, ABNORMAL 04/25/2010  . Local reaction to immunization 05/05/2012  minor resolving  zostavax   . Myocardial infarction mi2003  . Numbness in left leg    foot related to back disease and surgery  . Obesity, unspecified 04/24/2009  . Perforated appendicitis with necrosis s/p open appendectomy 06/07/14 06/04/2014  . Renal cyst    Characterized by MRI as simple  . Ruptured suppurative appendicitis    2015   . SLEEP APNEA, OBSTRUCTIVE 03/22/2009   compliant with CPAP  . THROMBOCYTOPENIA 08/16/2010  . TOBACCO USE, QUIT 10/24/2009  . 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 Umbilical hernia     Past Surgical History:  Procedure Laterality Date  . APPENDECTOMY N/A 06/06/2014   Procedure: APPENDECTOMY;  Surgeon: Odis Hollingshead, MD;  Location: [facility];  Service: General;  Laterality: N/A;  . COLON  SURGERY    . COLONOSCOPY  11/15/2011   Procedure: COLONOSCOPY;  Surgeon: Juanita Craver, MD;  Location: [facility];  Service: Endoscopy;  Laterality: N/A;  . cyst on epiglottis  08/2002  . ESOPHAGOGASTRODUODENOSCOPY  11/15/2011   Procedure: ESOPHAGOGASTRODUODENOSCOPY (EGD);  Surgeon: Juanita Craver, MD;  Location: [facility];  Service: Endoscopy;  Laterality: N/A;  . LAPAROSCOPIC APPENDECTOMY N/A 06/06/2014   Procedure: APPENDECTOMY LAPAROSCOPIC attemted;  Surgeon: Odis Hollingshead, MD;  Location: [facility];  Service: General;  Laterality: N/A;  . LUMBAR DISC SURGERY     two  holes in spinalcovering  . stent     LAD DUS 2004  . surgery l4-l5   1998   ruptured x 3  . ulnar neuropathy    . UMBILICAL HERNIA REPAIR     mesh    Social History   Social History  . Marital status: Married    Spouse name: N/A  . Number of children: N/A  . Years of education: N/A   Occupational History  . Not on file.   Social History Main Topics  . Smoking status: Former Smoker    Packs/day: 2.00    Years: 42.00    Types: Cigarettes    Quit date: 12/30/2008  . Smokeless tobacco: Never Used     Comment: started at age 5; 1-2 ppd; quit 2010  . Alcohol use No  . Drug use: No  . Sexual activity: Yes   Other Topics Concern  . Not on file   Social History Narrative   Retired paramedic   Regular exercise-yes not recently    Has children   Wife is overweight and has fibromyalgia and depression on disability doesn't go out much. Back surgery    Has older dog   Retired from [employer] 31 years of service now 7 years     Family History  Problem Relation Age of Onset  . Thyroid disease Mother   . Ovarian cancer Mother   . Breast cancer Mother   . Lung cancer Father   . Cancer Father     ROS: no fevers or chills, productive cough, hemoptysis, dysphasia, odynophagia, melena, hematochezia, dysuria, hematuria, rash, seizure activity, orthopnea, PND, pedal edema, claudication. Remaining systems are  negative.  Physical Exam: Well-developed well-nourished in no acute distress.  Skin is warm and dry.  HEENT is normal.  Neck is supple.  Chest is clear to auscultation with normal expansion.  Cardiovascular exam is regular rate and rhythm.  Abdominal exam nontender or distended. No masses palpated. Extremities show no edema. neuro grossly intact  ECG-Sinus rhythm with first-degree AV block. Left axis deviation. RV conduction delay.  A/P  1 Obesity-discussed importance of weight loss  2 iliac  aneurysm-followed by vascular surgery.  3 hyperlipidemia-continue statin. Check lipids and liver 2/18.   4 coronary artery disease-continue statin. No aspirin given need for anticoagulation.  5. Paroxysmal atrial fibrillation-a shunt remains in sinus rhythm. Continue tikosyn and pradaxa. Check cbc, bmet and Mg 2/18.  [name], MD

## 2016-11-18 ENCOUNTER — Ambulatory Visit (INDEPENDENT_AMBULATORY_CARE_PROVIDER_SITE_OTHER): Payer: Medicare Other | Admitting: Cardiology

## 2016-11-18 ENCOUNTER — Encounter: Payer: Self-pay | Admitting: Cardiology

## 2016-11-18 VITALS — BP 130/74 | HR 71 | Ht 73.0 in | Wt 322.2 lb

## 2016-11-18 DIAGNOSIS — I480 Paroxysmal atrial fibrillation: Secondary | ICD-10-CM | POA: Diagnosis not present

## 2016-11-18 DIAGNOSIS — E785 Hyperlipidemia, unspecified: Secondary | ICD-10-CM | POA: Diagnosis not present

## 2016-11-18 DIAGNOSIS — I4891 Unspecified atrial fibrillation: Secondary | ICD-10-CM

## 2016-11-18 DIAGNOSIS — I2510 Atherosclerotic heart disease of native coronary artery without angina pectoris: Secondary | ICD-10-CM

## 2016-11-18 MED ORDER — NITROGLYCERIN 0.4 MG SL SUBL: 0.4000 mg | SUBLINGUAL_TABLET | SUBLINGUAL | 12 refills | Status: DC | PRN

## 2016-11-18 NOTE — Patient Instructions (Signed)
Medication Instructions:   NO CHANGE  Labwork:  Your physician recommends that you return for lab work [place]  Follow-Up:  Your physician wants you to follow-up in: [place] will receive a reminder letter in the mail two months in advance. If you don't receive a letter, please call our office to schedule the follow-up appointment.   If you need a refill on your cardiac medications before your next appointment, please call your pharmacy.

## 2016-11-26 ENCOUNTER — Other Ambulatory Visit: Payer: Self-pay | Admitting: Gastroenterology

## 2016-11-27 ENCOUNTER — Telehealth: Payer: Self-pay | Admitting: *Deleted

## 2016-11-27 NOTE — Telephone Encounter (Signed)
Pt is needing clearance for colonoscopy and also directions regarding pradaxa. Will forward for dr Stanford Breed review

## 2016-11-27 NOTE — Telephone Encounter (Signed)
Hold pradaxa 2 days prior to procedure and resume day after [name] [name]  

## 2016-11-27 NOTE — Telephone Encounter (Signed)
Will fax this note to the number provided. 

## 2016-12-02 ENCOUNTER — Other Ambulatory Visit: Payer: Self-pay | Admitting: Family Medicine

## 2016-12-02 ENCOUNTER — Encounter: Payer: Self-pay | Admitting: Internal Medicine

## 2016-12-02 DIAGNOSIS — E1159 Type 2 diabetes mellitus with other circulatory complications: Secondary | ICD-10-CM

## 2016-12-04 ENCOUNTER — Telehealth: Payer: Self-pay | Admitting: Internal Medicine

## 2016-12-04 NOTE — Telephone Encounter (Signed)
Spoke to [place].  Informed him that I have linked the orders from Dr. Stanford Breed to the order for Dr. Regis Bill.  He will follow up with cardiology.

## 2016-12-04 NOTE — Telephone Encounter (Signed)
[name] [name] pt would like to have cardiology labs done that you told him that he could have done here.  Will Dr. Regis Bill okay pt having the labs done here?

## 2016-12-16 ENCOUNTER — Encounter: Payer: Self-pay | Admitting: Cardiology

## 2016-12-16 ENCOUNTER — Emergency Department (HOSPITAL_COMMUNITY): Payer: Medicare Other

## 2016-12-16 ENCOUNTER — Emergency Department (HOSPITAL_COMMUNITY)
Admission: EM | Admit: 2016-12-16 | Discharge: 2016-12-16 | Disposition: A | Discharge status: Home / Self Care | Payer: Medicare Other | Attending: Emergency Medicine | Admitting: Emergency Medicine

## 2016-12-16 ENCOUNTER — Encounter (HOSPITAL_COMMUNITY): Payer: Self-pay

## 2016-12-16 DIAGNOSIS — R002 Palpitations: Secondary | ICD-10-CM | POA: Diagnosis present

## 2016-12-16 DIAGNOSIS — I4891 Unspecified atrial fibrillation: Secondary | ICD-10-CM | POA: Diagnosis not present

## 2016-12-16 DIAGNOSIS — I252 Old myocardial infarction: Secondary | ICD-10-CM | POA: Insufficient documentation

## 2016-12-16 DIAGNOSIS — Z87891 Personal history of nicotine dependence: Secondary | ICD-10-CM | POA: Diagnosis not present

## 2016-12-16 DIAGNOSIS — Z7984 Long term (current) use of oral hypoglycemic drugs: Secondary | ICD-10-CM | POA: Insufficient documentation

## 2016-12-16 DIAGNOSIS — Z79899 Other long term (current) drug therapy: Secondary | ICD-10-CM | POA: Insufficient documentation

## 2016-12-16 DIAGNOSIS — E119 Type 2 diabetes mellitus without complications: Secondary | ICD-10-CM | POA: Insufficient documentation

## 2016-12-16 DIAGNOSIS — I2510 Atherosclerotic heart disease of native coronary artery without angina pectoris: Secondary | ICD-10-CM | POA: Diagnosis not present

## 2016-12-16 LAB — CBC
HCT: 48.7 % (ref 39.0–52.0)
Hemoglobin: 16.7 g/dL (ref 13.0–17.0)
MCH: 31.8 pg (ref 26.0–34.0)
MCHC: 34.3 g/dL (ref 30.0–36.0)
MCV: 92.8 fL (ref 78.0–100.0)
Platelets: 139 10*3/uL — ABNORMAL LOW (ref 150–400)
RBC: 5.25 MIL/uL (ref 4.22–5.81)
RDW: 13.6 % (ref 11.5–15.5)
WBC: 9.1 10*3/uL (ref 4.0–10.5)

## 2016-12-16 LAB — MAGNESIUM: Magnesium: 2.1 mg/dL (ref 1.7–2.4)

## 2016-12-16 LAB — BASIC METABOLIC PANEL
Anion gap: 10 (ref 5–15)
BUN: 9 mg/dL (ref 6–20)
CO2: 24 mmol/L (ref 22–32)
Calcium: 9.4 mg/dL (ref 8.9–10.3)
Chloride: 105 mmol/L (ref 101–111)
Creatinine, Ser: 0.75 mg/dL (ref 0.61–1.24)
GFR calc Af Amer: >60 mL/min (ref >60)
GFR calc non Af Amer: >60 mL/min (ref >60)
Glucose, Bld: 144 mg/dL — ABNORMAL HIGH (ref 65–99)
Potassium: 4.2 mmol/L (ref 3.5–5.1)
Sodium: 139 mmol/L (ref 135–145)

## 2016-12-16 LAB — PROTIME-INR
INR: 1.31
Prothrombin Time: 16.4 seconds — ABNORMAL HIGH (ref 11.4–15.2)

## 2016-12-16 MED ORDER — SODIUM CHLORIDE 0.9 % IV BOLUS (SEPSIS)
1000.0000 mL | Freq: Once | INTRAVENOUS | Status: AC
  Administered 2016-12-16: 1000 mL via INTRAVENOUS

## 2016-12-16 MED ORDER — PHENYLEPHRINE 40 MCG/ML (10ML) SYRINGE FOR IV PUSH (FOR BLOOD PRESSURE SUPPORT): 160.0000 ug | PREFILLED_SYRINGE | Freq: Once | INTRAVENOUS | Status: DC

## 2016-12-16 MED ORDER — PHENYLEPHRINE 40 MCG/ML (10ML) SYRINGE FOR IV PUSH (FOR BLOOD PRESSURE SUPPORT)
PREFILLED_SYRINGE | INTRAVENOUS | Status: AC
  Filled 2016-12-16: qty 10

## 2016-12-16 MED ORDER — PROPOFOL 10 MG/ML IV BOLUS
INTRAVENOUS | Status: AC | PRN
  Administered 2016-12-16: 100 mg via INTRAVENOUS

## 2016-12-16 MED ORDER — PROPOFOL 10 MG/ML IV BOLUS
1.0000 mg/kg | Freq: Once | INTRAVENOUS | Status: DC
  Filled 2016-12-16: qty 20

## 2016-12-16 NOTE — Sedation Documentation (Signed)
Pt shocked at 200J

## 2016-12-16 NOTE — Progress Notes (Signed)
RT present for cardioversion.

## 2016-12-16 NOTE — ED Triage Notes (Signed)
Pt reports he woke up with palpitations around 0300. Hx of a-fib with cardioversion. Pt denies any chest pain.

## 2016-12-16 NOTE — ED Provider Notes (Addendum)
[facility] DEPT Provider Note   CSN: [id number] Arrival date & time: 12/16/16  2409     History   Chief Complaint Chief Complaint  Patient presents with  . Palpitations    HPI [name] Sedlacek. is a 65 y.o. male.  HPI 65 yo M with extensive PMHx including AFib on Tikosyn and pradaxa here with acute onset palpitations. Pt states that his sx started at around 3 AM. He knows when he goes into AFib and states that he noticed acute onset of palpitations and SOB. He was asleep on the couch accidentally and did not have his CPAP on, which he thinks led to the event. He had sx persistent throughout the night with palpitations, SOB at rest and with exertion, and general fatigue. No CP. Sx feel similar to his last episodes. He has not missed any tikosyn or pradaxa doses and took it this AM. No unilateral weakness or numbness.  Past Medical History:  Diagnosis Date  . Atrial fibrillation ([place]) 03/22/2009   a. s/p multiple DCCV; b. no coumadin due to low TE risk profile; c. Tikosyn Rx  . Coronary atherosclerosis of native coronary artery 11/2002   a. s/p stent to LAD 12/03; OM2 occluded at cath 12/03; d. myoview 5/10: no ischemia;  e. echo 7/11: EF 55%, BAE, mild RVE, PASP 41-45; Myoview was in March 2013. There was no ischemia or infarction, EF 51%   . Diabetes mellitus without complication ([place])   . Drusen body    see opth note  . ERECTILE DYSFUNCTION 03/22/2009  . GERD 03/22/2009  . HYPERGLYCEMIA 04/25/2010  . HYPERLIPIDEMIA 03/22/2009  . 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 LATERAL EPICONDYLITIS, LEFT 10/24/2009  . LIVER FUNCTION TESTS, ABNORMAL 04/25/2010  . Local reaction to immunization 05/05/2012   minor resolving  zostavax   . Myocardial infarction mi2003  . Numbness in left leg    foot related to back disease and surgery  . Obesity, unspecified 04/24/2009  . Perforated appendicitis with necrosis s/p open appendectomy 06/07/14 06/04/2014  . Renal  cyst    Characterized by MRI as simple  . Ruptured suppurative appendicitis    2015   . SLEEP APNEA, OBSTRUCTIVE 03/22/2009   compliant with CPAP  . THROMBOCYTOPENIA 08/16/2010  . TOBACCO USE, QUIT 10/24/2009  . 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 Umbilical hernia     Patient Active Problem List   Diagnosis Date Noted  . Thrombocytopenia ([place]) 06/25/2016  . Atrial fibrillation ([place]) 01/01/2016  . Iliac aneurysm ([place])   . Morbid obesity ([place]) 07/08/2014  . Type 2 diabetes mellitus with other circulatory complications 73/53/2992  . Cutaneous abscess of back excluding buttocks 07/04/2014  . Chronic anticoagulation 12/21/2013  . Sleep disorder 06/21/2013  . Decreased hearing 08/06/2012  . Hemorrhoids 08/06/2012  . High risk medication use 05/05/2012  . Anticoagulation management encounter 05/05/2012  . Retinal tear 04/29/2012  . Personal history of colonic polyps 05/05/2011  . Visit for preventive health examination 05/05/2011  . PALPITATIONS 08/16/2010  . LIVER FUNCTION TESTS, ABNORMAL 04/25/2010  . Lateral epicondylitis 10/24/2009  . TOBACCO USE, QUIT 10/24/2009  . OBESITY, UNSPECIFIED 04/24/2009  . Hyperlipidemia 03/22/2009  . ERECTILE DYSFUNCTION 03/22/2009  . Obstructive sleep apnea 03/22/2009  . 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 MYOCARDIAL INFARCTION, HX OF 03/22/2009  . Coronary atherosclerosis 03/22/2009  . Atrial fibrillation with RVR ([place]) 03/22/2009  . GERD 03/22/2009    Past Surgical History:  Procedure Laterality Date  . APPENDECTOMY N/A 06/06/2014   Procedure: APPENDECTOMY;  Surgeon: Odis Hollingshead, MD;  Location: [facility];  Service: General;  Laterality: N/A;  . COLON SURGERY    . COLONOSCOPY  11/15/2011   Procedure: COLONOSCOPY;  Surgeon: Juanita Craver, MD;  Location: [facility] ENDOSCOPY;  Service: Endoscopy;  Laterality: N/A;  . cyst on epiglottis  08/2002  . ESOPHAGOGASTRODUODENOSCOPY  11/15/2011   Procedure: ESOPHAGOGASTRODUODENOSCOPY (EGD);  Surgeon: Juanita Craver, MD;  Location: [facility] END[facility];  Service: Endoscopy;  Laterality: N/A;  . LAPAROSCOPIC APPENDECTOMY N/A 06/06/2014   Procedure: APPENDECTOMY LAPAROSCOPIC attemted;  Surgeon: Odis Hollingshead, MD;  Location: [facility]S;  Service: General;  Laterality: N/A;  . LUMBAR DISC SURGERY     two  holes in spinalcovering  . stent     LAD DUS 2004  . surgery l4-l5   1998   ruptured x 3  . ulnar neuropathy    . UMBILICAL HERNIA REPAIR     mesh       Home Medications    Prior to Admission medications   Medication Sig Start Date End Date Taking? Authorizing Provider  acetaminophen (TYLENOL) 325 MG tablet Take 650 mg by mouth every 6 (six) hours as needed for mild pain.    Historical Provider, MD  albuterol (PROVENTIL HFA;VENTOLIN HFA) 108 (90 BASE) MCG/ACT inhaler Inhale 2 puffs into the lungs every 6 (six) hours as needed for wheezing or shortness of breath. 10/06/15   Burnis Medin, MD  atorvastatin (LIPITOR) 80 MG tablet Take 1 tablet by mouth  daily 04/08/16   Burnis Medin, MD  Blood Glucose Monitoring Suppl (ONETOUCH VERIO IQ SYSTEM) W/DEVICE KIT Use daily for monitoring blood glucose 03/22/14   Historical Provider, MD  diphenhydrAMINE (SOMINEX) 25 MG tablet Take 25 mg by mouth at bedtime as needed for sleep.    Historical Provider, MD  dofetilide (TIKOSYN) 500 MCG capsule Take 1 capsule by mouth two times daily 05/23/16   Lelon Perla, MD  fish oil-omega-3 fatty acids 1000 MG capsule Take 1 g by mouth 2 (two) times daily.     Historical Provider, MD  KLOR-CON M20 20 MEQ tablet TAKE 1 TABLET BY MOUTH EVERY DAY 07/29/16   Lelon Perla, MD  MAGNESIUM OXIDE PO Take 750 mg by mouth every evening.     Historical Provider, MD  metFORMIN (GLUCOPHAGE-XR) 500 MG 24 hr tablet Take 3 tablets by mouth  daily with breakfast 07/03/16   Burnis Medin, MD  MULTIPLE VITAMIN PO Take 1 tablet by mouth every evening.     Historical Provider, MD  nitroGLYCERIN (NITROSTAT) 0.4 MG SL tablet Place 1 tablet (0.4 mg  total) under the tongue every 5 (five) minutes as needed for chest pain. 11/18/16   Lelon Perla, MD  ONETOUCH DELICA LANCETS 12W MISC USE TO TEST BLOOD GLUCOSE TWICE DAILY 11/04/16   Burnis Medin, MD  [facility] VERIO test strip USE TWICE A DAY 11/01/15   Burnis Medin, MD  pantoprazole (PROTONIX) 40 MG tablet TAKE 1 TABLET (40 MG TOTAL) BY MOUTH DAILY. 07/11/16   Burnis Medin, MD  PRADAXA 150 MG CAPS capsule TAKE 1 CAPSULE BY MOUTH  EVERY 12 HOURS 11/01/16   Burnis Medin, MD  Suvorexant (BELSOMRA) 20 MG TABS Take 1 tablet by mouth at bedtime. 09/16/16   Burnis Medin, MD    Family History Family History  Problem Relation Age of Onset  . Thyroid disease Mother   .  Ovarian cancer Mother   . Breast cancer Mother   . Lung cancer Father   . Cancer Father     Social History Social History  Substance Use Topics  . Smoking status: Former Smoker    Packs/day: 2.00    Years: 42.00    Types: Cigarettes    Quit date: 12/30/2008  . Smokeless tobacco: Never Used     Comment: started at age 55; 1-2 ppd; quit 2010  . Alcohol use No     Allergies   Penicillins; Procaine hcl; Pseudoephedrine; Sulfonamide derivatives; and Cardizem [diltiazem]   Review of Systems Review of Systems  Constitutional: Positive for fatigue. Negative for chills and fever.  HENT: Negative for congestion and rhinorrhea.   Eyes: Negative for visual disturbance.  Respiratory: Positive for shortness of breath. Negative for cough and wheezing.   Cardiovascular: Positive for palpitations. Negative for chest pain and leg swelling.  Gastrointestinal: Negative for abdominal pain, diarrhea, nausea and vomiting.  Genitourinary: Negative for dysuria and flank pain.  Musculoskeletal: Negative for neck pain and neck stiffness.  Skin: Negative for rash and wound.  Allergic/Immunologic: Negative for immunocompromised state.  Neurological: Negative for syncope, weakness and headaches.  All other systems reviewed and are  negative.    Physical Exam Updated Vital Signs BP 119/67 (BP Location: Right Arm)   Pulse 67   Temp 97.9 F (36.6 C) (Oral)   Resp 12   Ht 6' 1"  (1.854 m)   Wt (!) 319 lb 10.7 oz (145 kg)   SpO2 93%   BMI 42.17 kg/m   Physical Exam  Constitutional: He is oriented to person, place, and time. He appears well-developed and well-nourished. No distress.  HENT:  Head: Normocephalic and atraumatic.  Eyes: Conjunctivae are normal.  Neck: Neck supple.  Cardiovascular: Normal heart sounds.  An irregularly irregular rhythm present. Tachycardia present.  Exam reveals no friction rub.   No murmur heard. Pulmonary/Chest: Effort normal and breath sounds normal. No respiratory distress. He has no wheezes. He has no rales.  Abdominal: He exhibits no distension.  Musculoskeletal: He exhibits no edema.  Neurological: He is alert and oriented to person, place, and time. He exhibits normal muscle tone.  Skin: Skin is warm. Capillary refill takes less than 2 seconds.  Psychiatric: He has a normal mood and affect.  Nursing note and vitals reviewed.    ED Treatments / Results  Labs (all labs ordered are listed, but only abnormal results are displayed) Labs Reviewed  BASIC METABOLIC PANEL - Abnormal; Notable for the following:       Result Value   Glucose, Bld 144 (*)    All other components within normal limits  CBC - Abnormal; Notable for the following:    Platelets 139 (*)    All other components within normal limits  PROTIME-INR - Abnormal; Notable for the following:    Prothrombin Time 16.4 (*)    All other components within normal limits  MAGNESIUM    EKG  EKG Interpretation  Date/Time:  Monday December 16 2016 09:49:26 EST Ventricular Rate:  112 PR Interval:    QRS Duration: 96 QT Interval:  354 QTC Calculation: 483 R Axis:   169 Text Interpretation:  Atrial fibrillation with rapid ventricular response Incomplete right bundle branch block Possible Right ventricular  hypertrophy Abnormal ECG Since last EKG, atrial fibrillation has replaced normal sinus rhythm Confirmed by Ellender Hose MD, Lysbeth Galas [phone]) on 12/16/2016 11:15:40 AM       Radiology Dg Chest 2 View  Result Date: 12/16/2016 CLINICAL DATA:  Shortness of breath. History of atrial fibrillation, previous MI, former heavy smoker. EXAM: CHEST  2 VIEW COMPARISON:  PA and lateral chest x-ray of January 01, 2016 FINDINGS: The lungs remain well-expanded. The interstitial markings remain diffusely increased and are slightly more conspicuous overall than on the previous study. The cardiac silhouette is top-normal in size. The pulmonary vascularity is not clearly engorged. There is calcification in the wall of the aortic arch. The mediastinum is normal in width. There is mild multilevel degenerative disc disease of the thoracic spine. IMPRESSION: Chronic bronchitic-fibrotic changes. Possible superimposed acute bronchitis. No alveolar pneumonia nor definite pulmonary edema. Thoracic aortic atherosclerosis. Electronically Signed   By: David  Martinique M.D.   On: 12/16/2016 10:42    Procedures .Cardioversion Date/Time: 12/16/2016 4:59 PM Performed by: Duffy Bruce Authorized by: Duffy Bruce   Consent:    Consent obtained:  Written   Consent given by:  Patient   Risks discussed:  Cutaneous burn, death, induced arrhythmia and pain   Alternatives discussed:  Rate-control medication and alternative treatment Universal protocol:    Procedure explained and questions answered to patient or proxy's satisfaction: yes     Relevant documents present and verified: yes     Test results available and properly labeled: yes     Imaging studies available: yes     Required blood products, implants, devices, and special equipment available: yes     Site/side marked: yes     Immediately prior to procedure a time out was called: yes     Patient identity confirmed:  Verbally with patient Pre-procedure details:     Cardioversion basis:  Emergent   Rhythm:  Atrial fibrillation   Electrode placement:  Anterior-posterior Attempt one:    Cardioversion mode:  Synchronous   Waveform:  Monophasic   Shock (Joules):  200   Shock outcome:  Conversion to normal sinus rhythm Post-procedure details:    Patient status:  Awake   Patient tolerance of procedure:  Tolerated well, no immediate complications .Sedation Date/Time: 12/16/2016 5:00 PM Performed by: Duffy Bruce Authorized by: Duffy Bruce   Consent:    Consent obtained:  Verbal   Consent given by:  Patient   Risks discussed:  Allergic reaction, dysrhythmia, inadequate sedation, nausea, vomiting, respiratory compromise necessitating ventilatory assistance and intubation, prolonged sedation necessitating reversal and prolonged hypoxia resulting in organ damage   Alternatives discussed:  Analgesia without sedation Indications:    Procedure performed:  Cardioversion   Procedure necessitating sedation performed by:  Physician performing sedation   Intended level of sedation:  Moderate (conscious sedation) Pre-sedation assessment:    NPO status caution: urgency dictates proceeding with non-ideal NPO status     ASA classification: class 2 - patient with mild systemic disease     Neck mobility: normal     Mouth opening:  3 or more finger widths   Thyromental distance:  4 finger widths   Mallampati score:  II - soft palate, uvula, fauces visible   Pre-sedation assessments completed and reviewed: airway patency, cardiovascular function, hydration status, mental status, nausea/vomiting, pain level and temperature     History of difficult intubation: no   Immediate pre-procedure details:    Reassessment: Patient reassessed immediately prior to procedure     Reviewed: vital signs     Verified: bag valve mask available   Procedure details (see MAR for exact dosages):    Preoxygenation:  Nasal cannula   Sedation:  Propofol   Intra-procedure monitoring:  Cardiac monitor, continuous capnometry, continuous pulse oximetry, frequent vital sign checks, frequent LOC assessments and blood pressure monitoring   Intra-procedure events: none     Total sedation time (minutes):  15 Post-procedure details:    Attendance: Constant attendance by certified staff until patient recovered     Recovery: Patient returned to pre-procedure baseline     Estimated blood loss (see I/O flowsheets): no     Post-sedation assessments completed and reviewed: airway patency, cardiovascular function, hydration status, mental status, nausea/vomiting, pain level, respiratory function and temperature     Patient is stable for discharge or admission: yes     Patient tolerance:  Tolerated well, no immediate complications .Critical Care Performed by: Duffy Bruce Authorized by: Duffy Bruce   Critical care provider statement:    Critical care time (minutes):  35   Critical care time was exclusive of:  Separately billable procedures and treating other patients   Critical care was necessary to treat or prevent imminent or life-threatening deterioration of the following conditions:  Circulatory failure and cardiac failure   Critical care was time spent personally by me on the following activities:  Development of treatment plan with patient or surrogate, discussions with consultants, evaluation of patient's response to treatment, examination of patient, obtaining history from patient or surrogate, ordering and performing treatments and interventions, ordering and review of laboratory studies, ordering and review of radiographic studies, pulse oximetry, re-evaluation of patient's condition and review of old charts   I assumed direction of critical care for this patient from another provider in my specialty: no     (including critical care time)  Medications Ordered in ED Medications  propofol (DIPRIVAN) 10 mg/mL bolus/IV push 145 mg (145 mg Intravenous Not Given 12/16/16 1256)   PHENYLephrine 40 mcg/ml in normal saline Adult IV Push Syringe (160 mcg Intravenous Not Given 12/16/16 1256)  sodium chloride 0.9 % bolus 1,000 mL (0 mLs Intravenous Stopped 12/16/16 1335)  propofol (DIPRIVAN) 10 mg/mL bolus/IV push (100 mg Intravenous Given 12/16/16 1248)     CHA2Ds2-VASc Score for Atrial Fibrillation    Patient Score  Age <65 = 0 65-74 = 1 > 75 = 2 1  Sex Male = 0 Male = 1 0  CHF History No = 0  Yes = 1 0  HTN History No = 0  Yes = 1 1  Stroke/TIA/TE History No = 0  Yes = 1 0  Vascular Disease History No = 0  Yes = 1 0  Diabetes History No = 0  Yes = 1 1  Total:  3   3.2 % stroke rate/year from a score of 3   Initial Impression / Assessment and Plan / ED Course  I have reviewed the triage vital signs and the nursing notes.  Pertinent labs & imaging results that were available during my care of the patient were reviewed by me and considered in my medical decision making (see chart for details).  Clinical Course     65 yo M with PMHx as above including AFib on Tikosyn and Pradaxa here with symptomatic paroxysmal AFib. On arrival, pt intermittently in RVR and EKG confirms AFib. No ischemic changes on EKG. Screening lab work unremarkable with any acute electrolyte abnormalities. Hgb at baseline. D/w Cardiologist Dr. Scharlene Corn, who agrees with plan for sedation/electrical cardioversion. As mentioned, sx <12 hr, pt has NOT missed any doses of pradaxa, and is an otherwise appropriate candidate. He has also not missed his Tikosyn.  Pt sedated and cardioverted as  above. Tolerated very well. Post-cardioversion EKG confirms NSR (see Media images). Ambulatory w/o difficulty. Neuro exam intact. Will d/c home.  Final Clinical Impressions(s) / ED Diagnoses   Final diagnoses:  Atrial fibrillation with rapid ventricular response [facility])    New Prescriptions Discharge Medication List as of 12/16/2016  2:09 PM       Duffy Bruce, MD 12/16/16 1703      Duffy Bruce, MD 12/16/16 1704

## 2016-12-17 ENCOUNTER — Telehealth (HOSPITAL_COMMUNITY): Payer: Self-pay | Admitting: *Deleted

## 2016-12-17 NOTE — Telephone Encounter (Signed)
LMOM for pt to clbk to either sched f/u with afib clinic since recent ED visit or to call [facility] office to sched.  f/u

## 2016-12-19 ENCOUNTER — Other Ambulatory Visit: Payer: Self-pay | Admitting: Internal Medicine

## 2016-12-19 NOTE — Telephone Encounter (Signed)
Pt would like a refill for Belsomra Tab is it okay to refill?

## 2016-12-20 MED ORDER — SUVOREXANT 20 MG PO TABS: 1.0000 | ORAL_TABLET | Freq: Every day | ORAL | 0 refills | Status: DC

## 2016-12-20 NOTE — Telephone Encounter (Signed)
Ok to refill x 4 months

## 2016-12-20 NOTE — Telephone Encounter (Signed)
Rx faxed

## 2016-12-26 ENCOUNTER — Encounter: Payer: Self-pay | Admitting: Physician Assistant

## 2016-12-26 ENCOUNTER — Ambulatory Visit (INDEPENDENT_AMBULATORY_CARE_PROVIDER_SITE_OTHER): Payer: Medicare Other | Admitting: Physician Assistant

## 2016-12-26 VITALS — BP 120/80 | HR 63 | Ht 73.0 in | Wt 324.2 lb

## 2016-12-26 DIAGNOSIS — I481 Persistent atrial fibrillation: Secondary | ICD-10-CM | POA: Diagnosis not present

## 2016-12-26 DIAGNOSIS — Z7901 Long term (current) use of anticoagulants: Secondary | ICD-10-CM | POA: Diagnosis not present

## 2016-12-26 DIAGNOSIS — I4819 Other persistent atrial fibrillation: Secondary | ICD-10-CM

## 2016-12-26 NOTE — Patient Instructions (Signed)
Medication Instructions:  NO CHANGES   If you need a refill on your cardiac medications before your next appointment, please call your pharmacy.  Labwork: PLEASE HAVE PRIMARY MD FAX [place] LAB RESULTS WHEN DONE TO [phone]   Follow-Up: Your physician wants you to follow-up in: [place]. You will receive a reminder letter in the mail two months in advance, IF NOT PLEASE CALL IN MARCH TO SCHEDULE THIS FOLLOW UP. If you don't receive a letter, please call our office to schedule the follow-up appointment.   [place]!!!     Thank you for choosing [facility] at [facility], LPN

## 2016-12-26 NOTE — Progress Notes (Signed)
Cardiology Office Note   Date:  12/26/2016   ID:  [name] [name]., DOB Nov 22, 1951, MRN [id number]  PCP:  Lottie Dawson, MD  Cardiologist:  Dr Stanford Breed 11/18/2016  Sharlize Hoar, Suanne Marker, PA-C   No chief complaint on file.   History of Present Illness: [name] [name]. is a 65 y.o. male with a history of stent to LAD in 2003, parox AFib, HL, OSA.s/p DCCV 05/2012 and 12/2015. Hx rash w/ Cardizem, on Tikosyn, Pradaxa. EF nl w/ grade 1 dd echo 05/2016, iliac aneurysm w/ mgt per [facility], obesity  12/18 ER visit for palpitations>>afib w/ RVR>>s/p DCCV to SR That was the first time he has had afib since 01/01/2016  [name]. presents for post-ER follow up.   He has done well since the ER visit. He is compliant with with CPAP but tends to fall asleep on the couch and stay there for several hours. On the day he was in atrial fib, he had fallen asleep and been there for 5 hours. He is planning to set a watch alarm to keep him from sleeping without the machine for too long.  No palpitations, no chest pain, no SOB, no LE edema. In general, doing very well.    Past Medical History:  Diagnosis Date  . Atrial fibrillation ([place]) 03/22/2009   a. s/p multiple DCCV; b. no coumadin due to low TE risk profile; c. Tikosyn Rx  . Coronary atherosclerosis of native coronary artery 11/2002   a. s/p stent to LAD 12/03; OM2 occluded at cath 12/03; d. myoview 5/10: no ischemia;  e. echo 7/11: EF 55%, BAE, mild RVE, PASP 41-45; Myoview was in March 2013. There was no ischemia or infarction, EF 51%   . Diabetes mellitus without complication ([place])   . Drusen body    see opth note  . ERECTILE DYSFUNCTION 03/22/2009  . GERD 03/22/2009  . HYPERGLYCEMIA 04/25/2010  . HYPERLIPIDEMIA 03/22/2009  . 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 LATERAL EPICONDYLITIS, LEFT 10/24/2009  . LIVER FUNCTION TESTS, ABNORMAL 04/25/2010  . Local reaction to immunization 05/05/2012   minor  resolving  zostavax   . Myocardial infarction mi2003  . Numbness in left leg    foot related to back disease and surgery  . Obesity, unspecified 04/24/2009  . Perforated appendicitis with necrosis s/p open appendectomy 06/07/14 06/04/2014  . Renal cyst    Characterized by MRI as simple  . Ruptured suppurative appendicitis    2015   . SLEEP APNEA, OBSTRUCTIVE 03/22/2009   compliant with CPAP  . THROMBOCYTOPENIA 08/16/2010  . TOBACCO USE, QUIT 10/24/2009  . 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 Umbilical hernia     Past Surgical History:  Procedure Laterality Date  . APPENDECTOMY N/A 06/06/2014   Procedure: APPENDECTOMY;  Surgeon: Odis Hollingshead, MD;  Location: [facility];  Service: General;  Laterality: N/A;  . COLON SURGERY    . COLONOSCOPY  11/15/2011   Procedure: COLONOSCOPY;  Surgeon: Juanita Craver, MD;  Location: [facility];  Service: Endoscopy;  Laterality: N/A;  . cyst on epiglottis  08/2002  . ESOPHAGOGASTRODUODENOSCOPY  11/15/2011   Procedure: ESOPHAGOGASTRODUODENOSCOPY (EGD);  Surgeon: Juanita Craver, MD;  Location: [facility];  Service: Endoscopy;  Laterality: N/A;  . LAPAROSCOPIC APPENDECTOMY N/A 06/06/2014   Procedure: APPENDECTOMY LAPAROSCOPIC attemted;  Surgeon: Odis Hollingshead, MD;  Location: [facility];  Service: General;  Laterality: N/A;  . LUMBAR [place]  two  holes in spinalcovering  . stent     LAD DUS 2004  . surgery l4-l5   1998   ruptured x 3  . ulnar neuropathy    . UMBILICAL HERNIA REPAIR     mesh    Current Outpatient Prescriptions  Medication Sig Dispense Refill  . acetaminophen (TYLENOL) 325 MG tablet Take 650 mg by mouth every 6 (six) hours as needed for mild pain.    [name] albuterol (PROVENTIL HFA;VENTOLIN HFA) 108 (90 BASE) MCG/ACT inhaler Inhale 2 puffs into the lungs every 6 (six) hours as needed for wheezing or shortness of breath. 1 Inhaler 1  . atorvastatin (LIPITOR) 80 MG tablet Take 1 tablet by mouth  daily (Patient taking differently: Take 80mg   daily in the evening) 90 tablet 3  . diphenhydrAMINE (BENADRYL) 25 MG tablet Take 25 mg by mouth at bedtime as needed for sleep.    [name] dofetilide (TIKOSYN) 500 MCG capsule Take 1 capsule by mouth two times daily 180 capsule 3  . famotidine (PEPCID AC) 10 MG chewable tablet Chew 10 mg by mouth daily as needed for heartburn.    . fish oil-omega-3 fatty acids 1000 MG capsule Take 1 g by mouth 2 (two) times daily.     [name] KLOR-CON M20 20 MEQ tablet TAKE 1 TABLET BY MOUTH EVERY DAY 90 tablet 2  . magnesium gluconate (MAGONATE) 500 MG tablet Take 750 mg by mouth every evening.    . metFORMIN (GLUCOPHAGE-XR) 500 MG 24 hr tablet Take 3 tablets by mouth  daily with breakfast (Patient taking differently: Take 500mg s 3 times daily) 270 tablet 1  . MULTIPLE VITAMIN PO Take 1 tablet by mouth every evening.     . nitroGLYCERIN (NITROSTAT) 0.4 MG SL tablet Place 1 tablet (0.4 mg total) under the tongue every 5 (five) minutes as needed for chest pain. 25 tablet 12  . pantoprazole (PROTONIX) 40 MG tablet TAKE 1 TABLET (40 MG TOTAL) BY MOUTH DAILY. 90 tablet 1  . PRADAXA 150 MG CAPS capsule TAKE 1 CAPSULE BY MOUTH  EVERY 12 HOURS 180 capsule 1  . Suvorexant (BELSOMRA) 20 MG TABS Take 1 tablet by mouth at bedtime. 90 tablet 0   No current facility-administered medications for this visit.     Allergies:   Penicillins; Procaine hcl; Pseudoephedrine; Sulfonamide derivatives; and Cardizem [diltiazem]    Social History:  The patient  reports that he quit smoking about 7 years ago. His smoking use included Cigarettes. He has a 84.00 pack-year smoking history. He has never used smokeless tobacco. He reports that he does not drink alcohol or use drugs.   Family History:  The patient's family history includes Breast cancer in his mother; Cancer in his father; Lung cancer in his father; Ovarian cancer in his mother; Thyroid disease in his mother.    ROS:  Please see the history of present illness. All other systems are  reviewed and negative.    PHYSICAL EXAM: VS:  BP 120/80   Pulse 63   Ht 6\' 1"  (1.854 m)   Wt (!) 324 lb 3.2 oz (147.1 kg)   BMI 42.77 kg/m  , BMI Body mass index is 42.77 kg/m. GEN: Well nourished, well developed, male in no acute distress  HEENT: normal for age  Neck: no JVD, no carotid bruit, no masses Cardiac: RRR; 2/6 murmur, no rubs, or gallops Respiratory:  clear to auscultation bilaterally, normal work of breathing GI: soft, nontender, nondistended, + BS MS: no deformity or  atrophy; no edema; distal pulses are 2+ in all 4 extremities   Skin: warm and dry, no rash Neuro:  Strength and sensation are intact Psych: euthymic mood, full affect   EKG:  EKG is ordered today. The ekg ordered today demonstrates SR, HR 63 No acute changes  ECHO: 06/06/2016 - Left ventricle: The cavity size was normal. Wall thickness was   normal. Systolic function was normal. The estimated ejection   fraction was in the range of 55% to 60%. Wall motion was normal;   there were no regional wall motion abnormalities. Doppler   parameters are consistent with abnormal left ventricular   relaxation (grade 1 diastolic dysfunction). - Mitral valve: Calcified annulus. - Left atrium: The atrium was mildly dilated.   Recent Labs: 06/18/2016: ALT 46; TSH 2.85 12/16/2016: BUN 9; Creatinine, Ser 0.75; Hemoglobin 16.7; Magnesium 2.1; Platelets 139; Potassium 4.2; Sodium 139    Lipid Panel    Component Value Date/Time   CHOL 96 06/18/2016 0821   TRIG 128.0 06/18/2016 0821   HDL 30.10 (L) 06/18/2016 0821   CHOLHDL 3 06/18/2016 0821   VLDL 25.6 06/18/2016 0821   LDLCALC 40 06/18/2016 0821     Wt Readings from Last 3 Encounters:  12/26/16 (!) 324 lb 3.2 oz (147.1 kg)  12/16/16 (!) 319 lb 10.7 oz (145 kg)  11/18/16 (!) 322 lb 3.2 oz (146.1 kg)     Other studies Reviewed: Additional studies/ records that were reviewed today include: hospital records, office notes and testing.  ASSESSMENT AND  PLAN:  1.  Persistent atrial fib: s/p DCCV>>SR. Continue Tikosyn, ECG today with normal intervals. Labs checked 12/18 and were ok, next check will be per PCP.  2. Chronic anticoagulation: He is compliant with Pradaxa.    Current medicines are reviewed at length with the patient today.  The patient does not have concerns regarding medicines.  The following changes have been made:  no change  Labs/ tests ordered today include:  No orders of the defined types were placed in this encounter.   Disposition:   FU with Dr Stanford Breed  Signed, Lenoard Aden  12/26/2016 3:39 PM    [place] Phone: [phone]; Fax: [phone]  This note was written with the assistance of speech recognition software. Please excuse any transcriptional errors.

## 2016-12-27 ENCOUNTER — Encounter (HOSPITAL_COMMUNITY): Payer: Self-pay

## 2017-01-02 NOTE — Anesthesia Preprocedure Evaluation (Addendum)
Anesthesia Evaluation  Patient identified by MRN, date of birth, ID band Patient awake    Reviewed: Allergy & Precautions, H&P , NPO status , Patient's Chart, lab work & pertinent test results  History of Anesthesia Complications Negative for: history of anesthetic complications  Airway Mallampati: III  TM Distance: >3 FB Neck ROM: full    Dental no notable dental hx. (+) Caps, Dental Advisory Given Cap right upper front:   Pulmonary sleep apnea and Continuous Positive Airway Pressure Ventilation , former smoker,    Pulmonary exam normal        Cardiovascular + CAD, + Past MI and + Cardiac Stents  Normal cardiovascular exam+ dysrhythmias Atrial Fibrillation   Study Conclusions  - Left ventricle: The cavity size was normal. Wall thickness was   normal. Systolic function was normal. The estimated ejection   fraction was in the range of 55% to 60%. Wall motion was normal;   there were no regional wall motion abnormalities. Doppler   parameters are consistent with abnormal left ventricular   relaxation (grade 1 diastolic dysfunction). - Mitral valve: Calcified annulus. - Left atrium: The atrium was mildly dilated.   Neuro/Psych negative neurological ROS  negative psych ROS   GI/Hepatic negative GI ROS, Neg liver ROS, GERD  Medicated and Controlled,  Endo/Other  diabetes, Well Controlled, Type 2, Oral Hypoglycemic AgentsMorbid obesity  Renal/GU negative Renal ROS  negative genitourinary   Musculoskeletal   Abdominal (+) - obese,   Peds  Hematology negative hematology ROS (+)   Anesthesia Other Findings   Reproductive/Obstetrics negative OB ROS                            Anesthesia Physical  Anesthesia Plan  ASA: III  Anesthesia Plan: MAC   Post-op Pain Management:    Induction:   Airway Management Planned: Natural Airway and Simple Face Mask  Additional Equipment:   Intra-op  Plan:   Post-operative Plan:   Informed Consent: I have reviewed the patients History and Physical, chart, labs and discussed the procedure including the risks, benefits and alternatives for the proposed anesthesia with the patient or authorized representative who has indicated his/her understanding and acceptance.   Dental advisory given  Plan Discussed with: CRNA and Anesthesiologist  Anesthesia Plan Comments:        Anesthesia Quick Evaluation

## 2017-01-03 ENCOUNTER — Ambulatory Visit (HOSPITAL_COMMUNITY): Payer: Medicare Other | Admitting: Anesthesiology

## 2017-01-03 ENCOUNTER — Encounter (HOSPITAL_COMMUNITY): Payer: Self-pay

## 2017-01-03 ENCOUNTER — Ambulatory Visit (HOSPITAL_COMMUNITY)
Admission: RE | Admit: 2017-01-03 | Discharge: 2017-01-03 | Disposition: A | Discharge status: Home / Self Care | Payer: Medicare Other | Source: Ambulatory Visit | Attending: Gastroenterology | Admitting: Gastroenterology

## 2017-01-03 ENCOUNTER — Encounter (HOSPITAL_COMMUNITY)
Admission: RE | Disposition: A | Discharge status: Home / Self Care | Payer: Self-pay | Source: Ambulatory Visit | Attending: Gastroenterology

## 2017-01-03 DIAGNOSIS — K219 Gastro-esophageal reflux disease without esophagitis: Secondary | ICD-10-CM | POA: Diagnosis not present

## 2017-01-03 DIAGNOSIS — I252 Old myocardial infarction: Secondary | ICD-10-CM | POA: Diagnosis not present

## 2017-01-03 DIAGNOSIS — K635 Polyp of colon: Secondary | ICD-10-CM | POA: Insufficient documentation

## 2017-01-03 DIAGNOSIS — D123 Benign neoplasm of transverse colon: Secondary | ICD-10-CM | POA: Diagnosis not present

## 2017-01-03 DIAGNOSIS — Z87891 Personal history of nicotine dependence: Secondary | ICD-10-CM | POA: Insufficient documentation

## 2017-01-03 DIAGNOSIS — Z1211 Encounter for screening for malignant neoplasm of colon: Secondary | ICD-10-CM | POA: Insufficient documentation

## 2017-01-03 DIAGNOSIS — Z955 Presence of coronary angioplasty implant and graft: Secondary | ICD-10-CM | POA: Diagnosis not present

## 2017-01-03 DIAGNOSIS — I723 Aneurysm of iliac artery: Secondary | ICD-10-CM | POA: Insufficient documentation

## 2017-01-03 DIAGNOSIS — G4733 Obstructive sleep apnea (adult) (pediatric): Secondary | ICD-10-CM | POA: Insufficient documentation

## 2017-01-03 DIAGNOSIS — I2510 Atherosclerotic heart disease of native coronary artery without angina pectoris: Secondary | ICD-10-CM | POA: Insufficient documentation

## 2017-01-03 DIAGNOSIS — K5730 Diverticulosis of large intestine without perforation or abscess without bleeding: Secondary | ICD-10-CM | POA: Insufficient documentation

## 2017-01-03 DIAGNOSIS — E119 Type 2 diabetes mellitus without complications: Secondary | ICD-10-CM | POA: Diagnosis not present

## 2017-01-03 DIAGNOSIS — Z6841 Body Mass Index (BMI) 40.0 and over, adult: Secondary | ICD-10-CM | POA: Insufficient documentation

## 2017-01-03 DIAGNOSIS — Z7984 Long term (current) use of oral hypoglycemic drugs: Secondary | ICD-10-CM | POA: Insufficient documentation

## 2017-01-03 DIAGNOSIS — N529 Male erectile dysfunction, unspecified: Secondary | ICD-10-CM | POA: Diagnosis not present

## 2017-01-03 DIAGNOSIS — I4891 Unspecified atrial fibrillation: Secondary | ICD-10-CM | POA: Diagnosis not present

## 2017-01-03 HISTORY — PX: COLONOSCOPY WITH PROPOFOL: SHX5780

## 2017-01-03 LAB — GLUCOSE, CAPILLARY: Glucose-Capillary: 154 mg/dL — ABNORMAL HIGH (ref 65–99)

## 2017-01-03 SURGERY — COLONOSCOPY WITH PROPOFOL
Anesthesia: Monitor Anesthesia Care

## 2017-01-03 MED ORDER — PROPOFOL 10 MG/ML IV BOLUS
INTRAVENOUS | Status: AC
  Filled 2017-01-03: qty 20

## 2017-01-03 MED ORDER — MIDAZOLAM HCL 2 MG/2ML IJ SOLN
INTRAMUSCULAR | Status: AC
  Filled 2017-01-03: qty 2

## 2017-01-03 MED ORDER — SODIUM CHLORIDE 0.9 % IV SOLN: INTRAVENOUS | Status: DC

## 2017-01-03 MED ORDER — PROPOFOL 10 MG/ML IV BOLUS
INTRAVENOUS | Status: AC
  Filled 2017-01-03: qty 40

## 2017-01-03 MED ORDER — MIDAZOLAM HCL 2 MG/2ML IJ SOLN
INTRAMUSCULAR | Status: DC | PRN
Start: 2017-01-03 — End: 2017-01-03
  Administered 2017-01-03: 2 mg via INTRAVENOUS

## 2017-01-03 MED ORDER — PROMETHAZINE HCL 25 MG/ML IJ SOLN: 6.2500 mg | INTRAMUSCULAR | Status: DC | PRN

## 2017-01-03 MED ORDER — LACTATED RINGERS IV SOLN
INTRAVENOUS | Status: DC | PRN
  Administered 2017-01-03: 10:00:00 via INTRAVENOUS

## 2017-01-03 MED ORDER — LACTATED RINGERS IV SOLN: INTRAVENOUS | Status: DC

## 2017-01-03 MED ORDER — HYDROMORPHONE HCL 1 MG/ML IJ SOLN: 0.2500 mg | INTRAMUSCULAR | Status: DC | PRN

## 2017-01-03 MED ORDER — PROPOFOL 10 MG/ML IV BOLUS
INTRAVENOUS | Status: DC | PRN
  Administered 2017-01-03 (×12): 40 mg via INTRAVENOUS

## 2017-01-03 MED ORDER — ONDANSETRON HCL 4 MG/2ML IJ SOLN
INTRAMUSCULAR | Status: DC | PRN
  Administered 2017-01-03 (×2): 4 mg via INTRAVENOUS

## 2017-01-03 MED ORDER — LACTATED RINGERS IV SOLN
INTRAVENOUS | Status: DC
  Administered 2017-01-03: 09:00:00 via INTRAVENOUS

## 2017-01-03 SURGICAL SUPPLY — 21 items

## 2017-01-03 NOTE — H&P (Signed)
[name] [name]. HPI: This is a 66 year old male with a PMH of an adenoma in 2012 with Dr. Collene Mares.  He also exhibited sigmoid diverticula.  The patient takes Pradaxa for his afib and he stopped the medication two days ago.  Past Medical History:  Diagnosis Date  . Atrial fibrillation ([place]) 03/22/2009   a. s/p multiple DCCV; b. no coumadin due to low TE risk profile; c. Tikosyn Rx  . Coronary atherosclerosis of native coronary artery 11/2002   a. s/p stent to LAD 12/03; OM2 occluded at cath 12/03; d. myoview 5/10: no ischemia;  e. echo 7/11: EF 55%, BAE, mild RVE, PASP 41-45; Myoview was in March 2013. There was no ischemia or infarction, EF 51%   . Diabetes mellitus without complication ([place])   . Drusen body    see opth note  . ERECTILE DYSFUNCTION 03/22/2009  . GERD 03/22/2009  . HYPERGLYCEMIA 04/25/2010  . HYPERLIPIDEMIA 03/22/2009  . 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 LATERAL EPICONDYLITIS, LEFT 10/24/2009  . LIVER FUNCTION TESTS, ABNORMAL 04/25/2010  . Local reaction to immunization 05/05/2012   minor resolving  zostavax   . Myocardial infarction mi2003  . Numbness in left leg    foot related to back disease and surgery  . Obesity, unspecified 04/24/2009  . Perforated appendicitis with necrosis s/p open appendectomy 06/07/14 06/04/2014  . Renal cyst    Characterized by MRI as simple  . Ruptured suppurative appendicitis    2015   . SLEEP APNEA, OBSTRUCTIVE 03/22/2009   compliant with CPAP  . THROMBOCYTOPENIA 08/16/2010  . TOBACCO USE, QUIT 10/24/2009  . 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 Umbilical hernia     Past Surgical History:  Procedure Laterality Date  . APPENDECTOMY N/A 06/06/2014   Procedure: APPENDECTOMY;  Surgeon: Odis Hollingshead, MD;  Location: [facility];  Service: General;  Laterality: N/A;  . COLON SURGERY    . COLONOSCOPY  11/15/2011   Procedure: COLONOSCOPY;  Surgeon: Juanita Craver, MD;  Location: [facility];  Service: Endoscopy;   Laterality: N/A;  . cyst on epiglottis  08/2002  . ESOPHAGOGASTRODUODENOSCOPY  11/15/2011   Procedure: ESOPHAGOGASTRODUODENOSCOPY (EGD);  Surgeon: Juanita Craver, MD;  Location: [facility];  Service: Endoscopy;  Laterality: N/A;  . LAPAROSCOPIC APPENDECTOMY N/A 06/06/2014   Procedure: APPENDECTOMY LAPAROSCOPIC attemted;  Surgeon: Odis Hollingshead, MD;  Location: [facility];  Service: General;  Laterality: N/A;  . LUMBAR [place] SURGERY     two  holes in spinalcovering  . stent     LAD DUS 2004  . surgery l4-l5   1998   ruptured x 3  . ulnar neuropathy    . UMBILICAL HERNIA REPAIR     mesh    Family History  Problem Relation Age of Onset  . Thyroid disease Mother   . Ovarian cancer Mother   . Breast cancer Mother   . Lung cancer Father   . Cancer Father     Social History:  reports that he quit smoking about 8 years ago. His smoking use included Cigarettes. He has a 84.00 pack-year smoking history. He has never used smokeless tobacco. He reports that he does not drink alcohol or use drugs.  Allergies:  Allergies  Allergen Reactions  . Penicillins     Childhood reaction   . Procaine Hcl Hives and Other (See Comments)    Red in the face and neck.  First generation in 1950s, but  after that, I have been able to tolerate novacaine and lidocaine.  . Pseudoephedrine Other (See Comments)    Patient went into afib  . Sulfonamide Derivatives     Childhood reaction   . Cardizem [Diltiazem] Rash    Medications:  Scheduled:  Continuous: . sodium chloride    . lactated ringers      No results found for this or any previous visit (from the past 24 hour(s)).   No results found.  ROS:  As stated above in the HPI otherwise negative.  There were no vitals taken for this visit.    PE: Gen: NAD, Alert and Oriented HEENT:  [place]/AT, EOMI Neck: Supple, no LAD Lungs: CTA Bilaterally CV: RRR without M/G/R ABM: Soft, NTND, +BS Ext: No C/C/E  Assessment/Plan: 1) Screening colonoscopy 2)  Personal history of adenoma.  [name] [name] D 01/03/2017, 9:08 AM

## 2017-01-03 NOTE — Anesthesia Postprocedure Evaluation (Signed)
Anesthesia Post Note  Patient: [name] [name].  Procedure(s) Performed: Procedure(s) (LRB): COLONOSCOPY WITH PROPOFOL (N/A)  Patient location during evaluation: Endoscopy Anesthesia Type: MAC Level of consciousness: awake and alert Pain management: pain level controlled Vital Signs Assessment: post-procedure vital signs reviewed and stable Respiratory status: spontaneous breathing and respiratory function stable Cardiovascular status: stable Anesthetic complications: no       Last Vitals:  Vitals:   01/03/17 1050 01/03/17 1100  BP: 131/70 (!) 118/59  Pulse: 68 (!) 54  Resp: 17 15  Temp:      Last Pain:  Vitals:   01/03/17 0912  TempSrc: Oral                 Sumner Boesch,Labron DANIEL

## 2017-01-03 NOTE — Discharge Instructions (Signed)

## 2017-01-03 NOTE — Op Note (Signed)
[facility] Patient Name: [name] [name] Procedure Date: 01/03/2017 MRN: [id number] Attending MD: Carol Ada , MD Date of Birth: 1951/09/06 CSN: [id number] Age: 66 Admit Type: Outpatient Procedure:                Colonoscopy Indications:              Screening for colorectal malignant neoplasm Providers:                Carol Ada, MD, Hilma Favors, RN, Ralene Bathe,                            Technician, Glenis Smoker, CRNA Referring MD:              Medicines:                Propofol per Anesthesia Complications:            No immediate complications. Estimated Blood Loss:     Estimated blood loss was minimal. Procedure:                Pre-Anesthesia Assessment:                           - Prior to the procedure, a History and Physical                            was performed, and patient medications and                            allergies were reviewed. The patient's tolerance of                            previous anesthesia was also reviewed. The risks                            and benefits of the procedure and the sedation                            options and risks were discussed with the patient.                            All questions were answered, and informed consent                            was obtained. Prior Anticoagulants: The patient has                            taken no previous anticoagulant or antiplatelet                            agents. ASA Grade Assessment: III - A patient with                            severe systemic disease. After reviewing the risks  and benefits, the patient was deemed in                            satisfactory condition to undergo the procedure.                           - Sedation was administered by an anesthesia                            professional. Deep sedation was attained.                           After obtaining informed consent, the colonoscope   was passed under direct vision. Throughout the                            procedure, the patient's blood pressure, pulse, and                            oxygen saturations were monitored continuously. The                            [id number]) scope was introduced through                            the anus and advanced to the the cecum, identified                            by appendiceal orifice and ileocecal valve. The                            colonoscopy was performed with difficulty due to                            poor endoscopic visualization, significant looping                            and a tortuous colon. Successful completion of the                            procedure was aided by applying abdominal pressure                            and lavage. The patient tolerated the procedure                            fairly well. The quality of the bowel preparation                            was good. The ileocecal valve, appendiceal orifice,                            and rectum were photographed. Scope In: 10:04:39 AM Scope Out: 10:32:38 AM Scope Withdrawal Time: 0 hours 23 minutes  26 seconds  Total Procedure Duration: 0 hours 27 minutes 59 seconds  Findings:      Three sessile polyps were found in the transverse colon and cecum. The       polyps were 2 to 3 mm in size. These polyps were removed with a cold       snare. Resection and retrieval were complete.      A 8 mm polyp was found in the sigmoid colon. The polyp was       semi-pedunculated. The polyp was removed with a hot snare. Resection and       retrieval were complete.      Scattered small and large-mouthed diverticula were found in the sigmoid       colon.      The sigmoid colon polyp may be redundant mucosa or an inflammatory polyp. Impression:               - Three 2 to 3 mm polyps in the transverse colon                            and in the cecum, removed with a cold snare.                             Resected and retrieved.                           - One 8 mm polyp in the sigmoid colon, removed with                            a hot snare. Resected and retrieved.                           - Diverticulosis in the sigmoid colon. Moderate Sedation:      N/A- Per Anesthesia Care Recommendation:           - Patient has a contact number available for                            emergencies. The signs and symptoms of potential                            delayed complications were discussed with the                            patient. Return to normal activities tomorrow.                            Written discharge instructions were provided to the                            patient.                           - Resume previous diet.                           - Continue present medications.                           -  Await pathology results.                           - Repeat colonoscopy in 3 - 5 years for                            surveillance with a 2 day prep.                           - Resume Pradaxa in 3 days. Procedure Code(s):        --- Professional ---                           [id number], Colonoscopy, flexible; with removal of                            tumor(s), polyp(s), or other lesion(s) by snare                            technique Diagnosis Code(s):        --- Professional ---                           Z12.11, Encounter for screening for malignant                            neoplasm of colon                           D12.3, Benign neoplasm of transverse colon (hepatic                            flexure or splenic flexure)                           D12.0, Benign neoplasm of cecum                           D12.5, Benign neoplasm of sigmoid colon                           K57.30, Diverticulosis of large intestine without                            perforation or abscess without bleeding CPT copyright 2016 [employer]. All rights reserved. The codes documented in  this report are preliminary and upon coder review may  be revised to meet current compliance requirements. Carol Ada, MD Carol Ada, MD 01/03/2017 10:50:28 AM This report has been signed electronically. Number of Addenda: 0

## 2017-01-03 NOTE — Transfer of Care (Signed)
Immediate Anesthesia Transfer of Care Note  Patient: [name] [name].  Procedure(s) Performed: Procedure(s): COLONOSCOPY WITH PROPOFOL (N/A)  Patient Location: PACU and Endoscopy Unit  Anesthesia Type:MAC  Level of Consciousness: awake, alert  and patient cooperative  Airway & Oxygen Therapy: Patient Spontanous Breathing and Patient connected to face mask oxygen  Post-op Assessment: Report given to RN and Post -op Vital signs reviewed and stable  Post vital signs: Reviewed and stable  Last Vitals:  Vitals:   01/03/17 0912  BP: (!) 125/58  Pulse: 61  Resp: 15  Temp: 36.6 C    Last Pain:  Vitals:   01/03/17 0912  TempSrc: Oral         Complications: No apparent anesthesia complications

## 2017-01-06 ENCOUNTER — Encounter (HOSPITAL_COMMUNITY): Payer: Self-pay | Admitting: Gastroenterology

## 2017-01-20 ENCOUNTER — Other Ambulatory Visit: Payer: Self-pay | Admitting: Internal Medicine

## 2017-01-22 NOTE — Telephone Encounter (Signed)
Sent to the pharmacy by e-scribe. 

## 2017-01-23 ENCOUNTER — Other Ambulatory Visit (INDEPENDENT_AMBULATORY_CARE_PROVIDER_SITE_OTHER): Payer: Medicare Other

## 2017-01-23 DIAGNOSIS — I4891 Unspecified atrial fibrillation: Secondary | ICD-10-CM | POA: Diagnosis not present

## 2017-01-23 DIAGNOSIS — E785 Hyperlipidemia, unspecified: Secondary | ICD-10-CM

## 2017-01-23 DIAGNOSIS — E1159 Type 2 diabetes mellitus with other circulatory complications: Secondary | ICD-10-CM | POA: Diagnosis not present

## 2017-01-23 LAB — LIPID PANEL
Cholesterol: 99 mg/dL (ref 0–200)
HDL: 35.9 mg/dL — ABNORMAL LOW (ref >39.00)
LDL Cholesterol: 26 mg/dL (ref 0–99)
NonHDL: 62.6
Total CHOL/HDL Ratio: 3
Triglycerides: 183 mg/dL — ABNORMAL HIGH (ref 0.0–149.0)
VLDL: 36.6 mg/dL (ref 0.0–40.0)

## 2017-01-23 LAB — BASIC METABOLIC PANEL
BUN: 19 mg/dL (ref 6–23)
CO2: 28 mEq/L (ref 19–32)
Calcium: 9.7 mg/dL (ref 8.4–10.5)
Chloride: 104 mEq/L (ref 96–112)
Creatinine, Ser: 0.78 mg/dL (ref 0.40–1.50)
GFR: 105.46 mL/min (ref >60.00)
Glucose, Bld: 128 mg/dL — ABNORMAL HIGH (ref 70–99)
Potassium: 4.3 mEq/L (ref 3.5–5.1)
Sodium: 139 mEq/L (ref 135–145)

## 2017-01-23 LAB — CBC WITH DIFFERENTIAL/PLATELET
Basophils Absolute: 0 10*3/uL (ref 0.0–0.1)
Basophils Relative: 0.3 % (ref 0.0–3.0)
Eosinophils Absolute: 0.7 10*3/uL (ref 0.0–0.7)
Eosinophils Relative: 7.2 % — ABNORMAL HIGH (ref 0.0–5.0)
HCT: 47.5 % (ref 39.0–52.0)
Hemoglobin: 16.4 g/dL (ref 13.0–17.0)
Lymphocytes Relative: 28.9 % (ref 12.0–46.0)
Lymphs Abs: 2.6 10*3/uL (ref 0.7–4.0)
MCHC: 34.5 g/dL (ref 30.0–36.0)
MCV: 92.3 fl (ref 78.0–100.0)
Monocytes Absolute: 0.7 10*3/uL (ref 0.1–1.0)
Monocytes Relative: 7.9 % (ref 3.0–12.0)
Neutro Abs: 5 10*3/uL (ref 1.4–7.7)
Neutrophils Relative %: 55.7 % (ref 43.0–77.0)
Platelets: 145 10*3/uL — ABNORMAL LOW (ref 150.0–400.0)
RBC: 5.15 Mil/uL (ref 4.22–5.81)
RDW: 13.6 % (ref 11.5–15.5)
WBC: 9 10*3/uL (ref 4.0–10.5)

## 2017-01-23 LAB — MAGNESIUM: Magnesium: 1.9 mg/dL (ref 1.5–2.5)

## 2017-01-23 LAB — HEPATIC FUNCTION PANEL
ALT: 44 U/L (ref 0–53)
AST: 23 U/L (ref 0–37)
Albumin: 4.1 g/dL (ref 3.5–5.2)
Alkaline Phosphatase: 53 U/L (ref 39–117)
Bilirubin, Direct: 0.2 mg/dL (ref 0.0–0.3)
Total Bilirubin: 0.9 mg/dL (ref 0.2–1.2)
Total Protein: 6.9 g/dL (ref 6.0–8.3)

## 2017-01-23 LAB — HEMOGLOBIN A1C: Hgb A1c MFr Bld: 6.9 % — ABNORMAL HIGH (ref 4.6–6.5)

## 2017-01-31 ENCOUNTER — Ambulatory Visit (INDEPENDENT_AMBULATORY_CARE_PROVIDER_SITE_OTHER): Payer: Medicare Other | Admitting: Internal Medicine

## 2017-01-31 ENCOUNTER — Encounter: Payer: Self-pay | Admitting: Internal Medicine

## 2017-01-31 VITALS — BP 140/80 | Temp 98.2°F | Ht 73.0 in | Wt 320.0 lb

## 2017-01-31 DIAGNOSIS — D696 Thrombocytopenia, unspecified: Secondary | ICD-10-CM

## 2017-01-31 DIAGNOSIS — E782 Mixed hyperlipidemia: Secondary | ICD-10-CM

## 2017-01-31 DIAGNOSIS — Z79899 Other long term (current) drug therapy: Secondary | ICD-10-CM

## 2017-01-31 DIAGNOSIS — E119 Type 2 diabetes mellitus without complications: Secondary | ICD-10-CM | POA: Diagnosis not present

## 2017-01-31 DIAGNOSIS — Z7901 Long term (current) use of anticoagulants: Secondary | ICD-10-CM

## 2017-01-31 NOTE — Progress Notes (Signed)
Chief Complaint  Patient presents with  . Follow-up    HPI: [name] [name]. 66 y.o. come in for Chronic disease management  Since his last visit he has had his 12th cardioversion with that went smoothly unsuccessfully. He had laboratory studies at that time. Over the holidays he may not be quite as well but no change in his medicine.  Hypoxia sleep apnea.   May be trigger falling   alsleep .   Without machine support in place. No new medical problems has had an eye check, back in a year may need a refill of [place] in the near future. Correct med list he is on magnesium oxide as opposed to gluconate. He is up-to-date in his had his colonoscopy or Dr. Collene Mares. ROS: See pertinent positives and negatives per HPI.  Past Medical History:  Diagnosis Date  . Atrial fibrillation ([place]) 03/22/2009   a. s/p multiple DCCV; b. no coumadin due to low TE risk profile; c. Tikosyn Rx  . Coronary atherosclerosis of native coronary artery 11/2002   a. s/p stent to LAD 12/03; OM2 occluded at cath 12/03; d. myoview 5/10: no ischemia;  e. echo 7/11: EF 55%, BAE, mild RVE, PASP 41-45; Myoview was in March 2013. There was no ischemia or infarction, EF 51%   . Diabetes mellitus without complication ([place])   . Drusen body    see opth note  . ERECTILE DYSFUNCTION 03/22/2009  . GERD 03/22/2009  . HYPERGLYCEMIA 04/25/2010  . HYPERLIPIDEMIA 03/22/2009  . 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 LATERAL EPICONDYLITIS, LEFT 10/24/2009  . LIVER FUNCTION TESTS, ABNORMAL 04/25/2010  . Local reaction to immunization 05/05/2012   minor resolving  zostavax   . Myocardial infarction mi2003  . Numbness in left leg    foot related to back disease and surgery  . Obesity, unspecified 04/24/2009  . Perforated appendicitis with necrosis s/p open appendectomy 06/07/14 06/04/2014  . Renal cyst    Characterized by MRI as simple  . Ruptured suppurative appendicitis    2015   . SLEEP APNEA,  OBSTRUCTIVE 03/22/2009   compliant with CPAP  . THROMBOCYTOPENIA 08/16/2010  . TOBACCO USE, QUIT 10/24/2009  . 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 Umbilical hernia     Family History  Problem Relation Age of Onset  . Thyroid disease Mother   . Ovarian cancer Mother   . Breast cancer Mother   . Lung cancer Father   . Cancer Father     Social History   Social History  . Marital status: Married    Spouse name: N/A  . Number of children: N/A  . Years of education: N/A   Social History Main Topics  . Smoking status: Former Smoker    Packs/day: 2.00    Years: 42.00    Types: Cigarettes    Quit date: 12/30/2008  . Smokeless tobacco: Never Used     Comment: started at age 12; 1-2 ppd; quit 2010  . Alcohol use No     Comment: rarely; maybe 1 beer a year  . Drug use: No  . Sexual activity: Yes   Other Topics Concern  . None   Social History Narrative   Retired paramedic   Regular exercise-yes not recently    Has children   Wife is overweight and has fibromyalgia and depression on disability doesn't go out much. Back surgery    Has older dog   Retired from  [employer] 31 years of service now 7 years     Outpatient Medications Prior to Visit  Medication Sig Dispense Refill  . acetaminophen (TYLENOL) 325 MG tablet Take 650 mg by mouth every 6 (six) hours as needed for mild pain.    [name] albuterol (PROVENTIL HFA;VENTOLIN HFA) 108 (90 BASE) MCG/ACT inhaler Inhale 2 puffs into the lungs every 6 (six) hours as needed for wheezing or shortness of breath. 1 Inhaler 1  . atorvastatin (LIPITOR) 80 MG tablet Take 1 tablet by mouth  daily (Patient taking differently: Take 80mg  daily in the evening) 90 tablet 3  . diphenhydrAMINE (BENADRYL) 25 MG tablet Take 25 mg by mouth at bedtime as needed for sleep.    [name] dofetilide (TIKOSYN) 500 MCG capsule Take 1 capsule by mouth two times daily 180 capsule 3  . famotidine (PEPCID AC) 10 MG chewable tablet Chew 10 mg by mouth daily as needed for heartburn.     . fish oil-omega-3 fatty acids 1000 MG capsule Take 1 g by mouth 2 (two) times daily.     [name] KLOR-CON M20 20 MEQ tablet TAKE 1 TABLET BY MOUTH EVERY DAY 90 tablet 2  . metFORMIN (GLUCOPHAGE-XR) 500 MG 24 hr tablet TAKE 3 TABLETS BY MOUTH  DAILY WITH BREAKFAST 270 tablet 1  . MULTIPLE VITAMIN PO Take 1 tablet by mouth every evening.     . nitroGLYCERIN (NITROSTAT) 0.4 MG SL tablet Place 1 tablet (0.4 mg total) under the tongue every 5 (five) minutes as needed for chest pain. 25 tablet 12  . pantoprazole (PROTONIX) 40 MG tablet TAKE 1 TABLET (40 MG TOTAL) BY MOUTH DAILY. 90 tablet 1  . PRADAXA 150 MG CAPS capsule TAKE 1 CAPSULE BY MOUTH  EVERY 12 HOURS 180 capsule 1  . Suvorexant (BELSOMRA) 20 MG TABS Take 1 tablet by mouth at bedtime. 90 tablet 0  . magnesium gluconate (MAGONATE) 500 MG tablet Take 750 mg by mouth every evening.     No facility-administered medications prior to visit.      EXAM:  BP 140/80 (BP Location: Right Arm, Patient Position: Sitting, Cuff Size: Large)   Temp 98.2 F (36.8 C) (Oral)   Ht 6\' 1"  (1.854 m)   Wt (!) 320 lb (145.2 kg)   BMI 42.22 kg/m   Body mass index is 42.22 kg/m.  GENERAL: vitals reviewed and listed above, alert, oriented, appears well hydrated and in no acute distress HEENT: atraumatic, conjunctiva  clear, no obvious abnormalities on inspection of external nose and earsMS: moves all extremities without noticeable focal  abnormality PSYCH: pleasant and cooperative, no obvious depression or anxiety Lab Results  Component Value Date   WBC 9.0 01/23/2017   HGB 16.4 01/23/2017   HCT 47.5 01/23/2017   PLT 145.0 (L) 01/23/2017   GLUCOSE 128 (H) 01/23/2017   CHOL 99 01/23/2017   TRIG 183.0 (H) 01/23/2017   HDL 35.90 (L) 01/23/2017   LDLCALC 26 01/23/2017   ALT 44 01/23/2017   AST 23 01/23/2017   NA 139 01/23/2017   K 4.3 01/23/2017   CL 104 01/23/2017   CREATININE 0.78 01/23/2017   BUN 19 01/23/2017   CO2 28 01/23/2017   TSH 2.85  06/18/2016   PSA 0.80 06/18/2016   INR 1.31 12/16/2016   HGBA1C 6.9 (H) 01/23/2017   MICROALBUR 9.6 (H) 06/18/2016   BP Readings from Last 3 Encounters:  01/31/17 140/80  01/03/17 (!) 118/59  12/26/16 120/80   Reviewed laboratory studies with him ASSESSMENT AND  PLAN:  Discussed the following assessment and plan:  Diabetes mellitus without complication ([place]) - Creeping up still at goal 6.9 intensify lifestyle continue medications patient aware  Thrombocytopenia ([place]) - Stable to follow  Mixed hyperlipidemia - LDL at goal triglycerides up if the sugar down.  Medication management - Contact us forrefill  Chronic anticoagulation  Morbid obesity ([place])  -Patient advised to return or notify health care team  if  new concerns arise.  Patient Instructions  Attention To     Blood sugar .     Not to creep up.  Wellness  check up in June  when you are due.  Make sure bp is at goal.  Below 140/ 90 .              Standley Brooking. Aaira Oestreicher M.D.

## 2017-01-31 NOTE — Patient Instructions (Addendum)
Attention To     Blood sugar .     Not to creep up.  Wellness  check up in June  when you are due.  Make sure bp is at goal.  Below 140/ 90 .

## 2017-02-11 ENCOUNTER — Encounter: Payer: Self-pay | Admitting: Pulmonary Disease

## 2017-02-11 ENCOUNTER — Ambulatory Visit (INDEPENDENT_AMBULATORY_CARE_PROVIDER_SITE_OTHER): Payer: Medicare Other | Admitting: Pulmonary Disease

## 2017-02-11 VITALS — BP 126/62 | HR 70 | Ht 73.0 in | Wt 323.0 lb

## 2017-02-11 DIAGNOSIS — G4733 Obstructive sleep apnea (adult) (pediatric): Secondary | ICD-10-CM

## 2017-02-11 DIAGNOSIS — Z9989 Dependence on other enabling machines and devices: Secondary | ICD-10-CM | POA: Diagnosis not present

## 2017-02-11 DIAGNOSIS — Z6841 Body Mass Index (BMI) 40.0 and over, adult: Secondary | ICD-10-CM

## 2017-02-11 NOTE — Progress Notes (Signed)
Current Outpatient Prescriptions on File Prior to Visit  Medication Sig  . acetaminophen (TYLENOL) 325 MG tablet Take 650 mg by mouth every 6 (six) hours as needed for mild pain.  [name] albuterol (PROVENTIL HFA;VENTOLIN HFA) 108 (90 BASE) MCG/ACT inhaler Inhale 2 puffs into the lungs every 6 (six) hours as needed for wheezing or shortness of breath.  [name] atorvastatin (LIPITOR) 80 MG tablet Take 1 tablet by mouth  daily (Patient taking differently: Take 80mg  daily in the evening)  . diphenhydrAMINE (BENADRYL) 25 MG tablet Take 25 mg by mouth at bedtime as needed for sleep.  [name] dofetilide (TIKOSYN) 500 MCG capsule Take 1 capsule by mouth two times daily  . famotidine (PEPCID AC) 10 MG chewable tablet Chew 10 mg by mouth daily as needed for heartburn.  . fish oil-omega-3 fatty acids 1000 MG capsule Take 1 g by mouth 2 (two) times daily.   [name] KLOR-CON M20 20 MEQ tablet TAKE 1 TABLET BY MOUTH EVERY DAY  . Magnesium Oxide 500 MG TABS Take 750 mg by mouth daily.  . metFORMIN (GLUCOPHAGE-XR) 500 MG 24 hr tablet TAKE 3 TABLETS BY MOUTH  DAILY WITH BREAKFAST  . MULTIPLE VITAMIN PO Take 1 tablet by mouth every evening.   . nitroGLYCERIN (NITROSTAT) 0.4 MG SL tablet Place 1 tablet (0.4 mg total) under the tongue every 5 (five) minutes as needed for chest pain.  . pantoprazole (PROTONIX) 40 MG tablet TAKE 1 TABLET (40 MG TOTAL) BY MOUTH DAILY.  [name] PRADAXA 150 MG CAPS capsule TAKE 1 CAPSULE BY MOUTH  EVERY 12 HOURS  . Suvorexant (BELSOMRA) 20 MG TABS Take 1 tablet by mouth at bedtime.   No current facility-administered medications on file prior to visit.      Chief Complaint  Patient presents with  . Follow-up    Wears CPAP nightly. Denies problems with mask/pressure. Recent pressure change seems to be working well. DME: [facility] ; Pt has current cold, started about 10 days ago.      Sleep tests PSG 12/26/02 >> AHI 25 Auto CPAP 01/12/17 to 02/10/17 >> used on 30 of 30 nights with average 4 hrs 11 min.  Average AHI  15.7 with median CPAP 15 and 95 th percentile CPAP 18 cm H2O  Past medical history HLD, Thrombocytopenia, ED, CAD s/p stent, A fib s/p ablation, GERD, DM, Renal cyst  Past surgical history, Family history, Social history, Allergies reviewed  Vital Signs BP 126/62 (BP Location: Left Arm, Cuff Size: Normal)   Pulse 78   Ht 6\' 1"  (1.854 m)   Wt (!) 323 lb (146.5 kg)   SpO2 90%   BMI 42.61 kg/m   History of Present Illness [name] [name]. is a 66 y.o. male with OSA.  He uses full face mask.  Feels mask fits well.  He does get occasional leak.  He feels CPAP settings otherwise are okay.  He doesn't think he could use nasal mask due to septal deviation.  Physical Exam  General - pleasant ENT - no sinus tenderness, no oral exudate, no LAN, septal deviation, MP 3 Cardiac - regular, no murmur Chest - no wheeze, rales Back - no tenderness Abd - soft, non tender Ext - no edema Neuro - normal strength Skin - no rashes Psych - normal mood  Repeat pulse ox on forehead prob - 96%  Assessment/Plan  Obstructive sleep apnea. - he is compliant with therapy - continue auto CPAP - his AHI is still elevated >> some of  this is related to mask leak; he would likely benefit from nasal mask, but septal deviation would make this difficult to tolerate  Insomnia. - belsomra through PCP  Morbid obesity. - discussed importance of weight loss  Patient Instructions  Follow up in 1 year    Chesley Mires, MD [facility] Pager:  [phone] 02/11/2017, 11:53 AM

## 2017-02-11 NOTE — Patient Instructions (Signed)
Follow up in 1 year.

## 2017-02-25 ENCOUNTER — Encounter: Payer: Self-pay | Admitting: Internal Medicine

## 2017-02-26 NOTE — Progress Notes (Signed)
Chief Complaint  Patient presents with  . Flank Pain    started last friday/ took tylenol to relieve pain and heating pad   . Back Pain    pain scale about and 8/ dark urine but no frequency burning or itching    HPI: sda  Onset 6-7 days ago insidious onset and  Worsening   In context of  Cold after last visit and coughing  But   No full blown bronchitis  Taking guaifenesin   Getting better .  ponset of bilateral lbp    Last Friday 7 days ago like  Muscle spasm and  Tylenol and  healing pad didn't help and getting worse    3/5 to   3-7  6-8   /10   Movement is worse .   Bilateral above waiset location   But last night  Was righ t.lower or flank  Radiated  To lateral abd  today .  No blood  Dark last  Week .  No fever vomiting diarrhea bleeding noted. Pain is worse with movement less when maintaining stillness. Has a history of gallstones on this scan and iliac artery aneurysm on the right area He is followed yearly by vascular surgery area last angiogram T was August 2017.  ROS: See pertinent positives and negatives per HPI. No bleeding cp sob    Past Medical History:  Diagnosis Date  . Atrial fibrillation ([place]) 03/22/2009   a. s/p multiple DCCV; b. no coumadin due to low TE risk profile; c. Tikosyn Rx  . Coronary atherosclerosis of native coronary artery 11/2002   a. s/p stent to LAD 12/03; OM2 occluded at cath 12/03; d. myoview 5/10: no ischemia;  e. echo 7/11: EF 55%, BAE, mild RVE, PASP 41-45; Myoview was in March 2013. There was no ischemia or infarction, EF 51%   . Diabetes mellitus without complication ([place])   . Drusen body    see opth note  . ERECTILE DYSFUNCTION 03/22/2009  . GERD 03/22/2009  . HYPERGLYCEMIA 04/25/2010  . HYPERLIPIDEMIA 03/22/2009  . 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 LATERAL EPICONDYLITIS, LEFT 10/24/2009  . LIVER FUNCTION TESTS, ABNORMAL 04/25/2010  . Local reaction to immunization 05/05/2012   minor resolving  zostavax    . Myocardial infarction mi2003  . Numbness in left leg    foot related to back disease and surgery  . Obesity, unspecified 04/24/2009  . Perforated appendicitis with necrosis s/p open appendectomy 06/07/14 06/04/2014  . Renal cyst    Characterized by MRI as simple  . Ruptured suppurative appendicitis    2015   . SLEEP APNEA, OBSTRUCTIVE 03/22/2009   compliant with CPAP  . THROMBOCYTOPENIA 08/16/2010  . TOBACCO USE, QUIT 10/24/2009  . 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 Umbilical hernia     Family History  Problem Relation Age of Onset  . Thyroid disease Mother   . Ovarian cancer Mother   . Breast cancer Mother   . Lung cancer Father   . Cancer Father     Social History   Social History  . Marital status: Married    Spouse name: N/A  . Number of children: N/A  . Years of education: N/A   Social History Main Topics  . Smoking status: Former Smoker    Packs/day: 2.00    Years: 42.00    Types: Cigarettes    Quit date: 12/30/2008  . Smokeless tobacco: Never Used  Comment: started at age 46; 1-2 ppd; quit 2010  . Alcohol use No     Comment: rarely; maybe 1 beer a year  . Drug use: No  . Sexual activity: Yes   Other Topics Concern  . None   Social History Narrative   Retired paramedic   Regular exercise-yes not recently    Has children   Wife is overweight and has fibromyalgia and depression on disability doesn't go out much. Back surgery    Has older dog   Retired from [employer] 31 years of service now 7 years     Outpatient Medications Prior to Visit  Medication Sig Dispense Refill  . acetaminophen (TYLENOL) 325 MG tablet Take 650 mg by mouth every 6 (six) hours as needed for mild pain.    [name] albuterol (PROVENTIL HFA;VENTOLIN HFA) 108 (90 BASE) MCG/ACT inhaler Inhale 2 puffs into the lungs every 6 (six) hours as needed for wheezing or shortness of breath. 1 Inhaler 1  . atorvastatin (LIPITOR) 80 MG tablet Take 1 tablet by mouth  daily (Patient taking differently: Take  80mg  daily in the evening) 90 tablet 3  . diphenhydrAMINE (BENADRYL) 25 MG tablet Take 25 mg by mouth at bedtime as needed for sleep.    [name] dofetilide (TIKOSYN) 500 MCG capsule Take 1 capsule by mouth two times daily 180 capsule 3  . famotidine (PEPCID AC) 10 MG chewable tablet Chew 10 mg by mouth daily as needed for heartburn.    . fish oil-omega-3 fatty acids 1000 MG capsule Take 1 g by mouth 2 (two) times daily.     [name] KLOR-CON M20 20 MEQ tablet TAKE 1 TABLET BY MOUTH EVERY DAY 90 tablet 2  . Magnesium Oxide 500 MG TABS Take 750 mg by mouth daily.    . metFORMIN (GLUCOPHAGE-XR) 500 MG 24 hr tablet TAKE 3 TABLETS BY MOUTH  DAILY WITH BREAKFAST 270 tablet 1  . MULTIPLE VITAMIN PO Take 1 tablet by mouth every evening.     . nitroGLYCERIN (NITROSTAT) 0.4 MG SL tablet Place 1 tablet (0.4 mg total) under the tongue every 5 (five) minutes as needed for chest pain. 25 tablet 12  . pantoprazole (PROTONIX) 40 MG tablet TAKE 1 TABLET (40 MG TOTAL) BY MOUTH DAILY. 90 tablet 1  . PRADAXA 150 MG CAPS capsule TAKE 1 CAPSULE BY MOUTH  EVERY 12 HOURS 180 capsule 1  . Suvorexant (BELSOMRA) 20 MG TABS Take 1 tablet by mouth at bedtime. 90 tablet 0   No facility-administered medications prior to visit.      EXAM:  BP 140/70 (BP Location: Right Arm, Patient Position: Sitting, Cuff Size: Normal)   Pulse 61   Temp 98.3 F (36.8 C) (Oral)   Ht 6\' 1"  (1.854 m)   Wt (!) 321 lb 3.2 oz (145.7 kg)   BMI 42.38 kg/m   Body mass index is 42.38 kg/m.  GENERAL: vitals reviewed and listed above, alert, oriented, appears well hydrated and in no acute distress mild  [place] with motion  HEENT: atraumatic, conjunctiva  clear, no obvious abnormalities on inspection of external nose and ears NECK: no obvious masses on inspection palpation  LUNGS: clear to auscultation bilaterally, no wheezes, rales or rhonchi, good air movement CV: HRRR, no clubbing cyanosis or  peripheral edema nl cap refill  Back no midline  tenderness  Points to paraspinal bilateral lumbar area of pain and right lower flank no bruising    abd soft no mass or hernia   No  bruit heard  Healed scar rlq no tenderness  MS: moves all extremities without noticeable focal  Abnormality Skin no hematoma  Of bruising flank etc  PSYCH: pleasant and cooperative, no obvious depression or anxiety Lab Results  Component Value Date   WBC 9.0 01/23/2017   HGB 16.4 01/23/2017   HCT 47.5 01/23/2017   PLT 145.0 (L) 01/23/2017   GLUCOSE 128 (H) 01/23/2017   CHOL 99 01/23/2017   TRIG 183.0 (H) 01/23/2017   HDL 35.90 (L) 01/23/2017   LDLCALC 26 01/23/2017   ALT 44 01/23/2017   AST 23 01/23/2017   NA 139 01/23/2017   K 4.3 01/23/2017   CL 104 01/23/2017   CREATININE 0.78 01/23/2017   BUN 19 01/23/2017   CO2 28 01/23/2017   TSH 2.85 06/18/2016   PSA 0.80 06/18/2016   INR 1.31 12/16/2016   HGBA1C 6.9 (H) 01/23/2017   MICROALBUR 9.6 (H) 06/18/2016   UA clear   ASSESSMENT AND PLAN:  Discussed the following assessment and plan:  Acute insidious onset  right-sided back pain, unspecified back location - Plan: POCT Urinalysis Dipstick (Automated)  Iliac artery aneurysm, right (HCC)  Chronic anticoagulation  Atherosclerosis Acts MS mechanical  cause   Based on  Context  But does have hx of  Iliac aneurysm on right side  .  Pain is   Worse with   motion  Has hx gallstones and   Not typical  No visceral sx   Onset of this after coughing illness  Recovering  Tramadol and  musck relaxant .  But  Involve  bascular for advice ? If imaging indicated ? -Patient advised to return or notify health care team  if symptoms worsen ,persist or new concerns arise.  Patient Instructions   This pain still acts like  mechanical musculoskeletal cause but I understand you're concerned about the history of iliac artery aneurysm. Begin muscle relaxant and  Pain control.     Will contact   Vascular   opinion  About  Advisability of early imaging.   If  becoming acutely worse contact team emergently.    Standley Brooking. Raul [name] M.D.

## 2017-02-27 ENCOUNTER — Telehealth: Payer: Self-pay | Admitting: Internal Medicine

## 2017-02-27 ENCOUNTER — Encounter: Payer: Self-pay | Admitting: Internal Medicine

## 2017-02-27 ENCOUNTER — Ambulatory Visit (INDEPENDENT_AMBULATORY_CARE_PROVIDER_SITE_OTHER): Payer: Medicare Other | Admitting: Internal Medicine

## 2017-02-27 VITALS — BP 140/70 | HR 61 | Temp 98.3°F | Ht 73.0 in | Wt 321.2 lb

## 2017-02-27 DIAGNOSIS — I723 Aneurysm of iliac artery: Secondary | ICD-10-CM

## 2017-02-27 DIAGNOSIS — I7090 Unspecified atherosclerosis: Secondary | ICD-10-CM

## 2017-02-27 DIAGNOSIS — M549 Dorsalgia, unspecified: Secondary | ICD-10-CM

## 2017-02-27 DIAGNOSIS — Z7901 Long term (current) use of anticoagulants: Secondary | ICD-10-CM | POA: Diagnosis not present

## 2017-02-27 LAB — POC URINALSYSI DIPSTICK (AUTOMATED)
Bilirubin, UA: 1
Blood, UA: NEGATIVE
Glucose, UA: NEGATIVE
Ketones, UA: NEGATIVE
Leukocytes, UA: NEGATIVE
Nitrite, UA: NEGATIVE
Protein, UA: 15
Spec Grav, UA: 1.015
Urobilinogen, UA: 0.2
pH, UA: 8

## 2017-02-27 MED ORDER — TRAMADOL HCL 50 MG PO TABS: 50.0000 mg | ORAL_TABLET | Freq: Three times a day (TID) | ORAL | 0 refills | Status: DC | PRN

## 2017-02-27 MED ORDER — METHOCARBAMOL 500 MG PO TABS: 1000.0000 mg | ORAL_TABLET | Freq: Four times a day (QID) | ORAL | 1 refills | Status: DC | PRN

## 2017-02-27 NOTE — Telephone Encounter (Signed)
Pt state that the pharmacy sent him a text stating that the ROBAXIN is not covered by his insurance and wanted to know if there is something else that he could take.  He will call the pharmacy to see what is really going on with the Rx.  Pt would like to have call.

## 2017-02-27 NOTE — Patient Instructions (Addendum)
  This pain still acts like  mechanical musculoskeletal cause but I understand you're concerned about the history of iliac artery aneurysm. Begin muscle relaxant and  Pain control.     Will contact   Vascular   opinion  About  Advisability of early imaging.   If becoming acutely worse contact team emergently.

## 2017-02-27 NOTE — Telephone Encounter (Signed)
Spoke with pt and he states that he will pay cash for Robaxin prescription its only 17 dollars. Nothing further needed

## 2017-03-01 ENCOUNTER — Emergency Department (HOSPITAL_COMMUNITY): Payer: Medicare Other

## 2017-03-01 ENCOUNTER — Emergency Department (HOSPITAL_COMMUNITY)
Admission: EM | Admit: 2017-03-01 | Discharge: 2017-03-01 | Disposition: A | Discharge status: Home / Self Care | Payer: Medicare Other | Attending: Emergency Medicine | Admitting: Emergency Medicine

## 2017-03-01 DIAGNOSIS — Z87891 Personal history of nicotine dependence: Secondary | ICD-10-CM | POA: Diagnosis not present

## 2017-03-01 DIAGNOSIS — R51 Headache: Secondary | ICD-10-CM | POA: Diagnosis present

## 2017-03-01 DIAGNOSIS — Z79899 Other long term (current) drug therapy: Secondary | ICD-10-CM | POA: Diagnosis not present

## 2017-03-01 DIAGNOSIS — Z955 Presence of coronary angioplasty implant and graft: Secondary | ICD-10-CM | POA: Diagnosis not present

## 2017-03-01 DIAGNOSIS — R05 Cough: Secondary | ICD-10-CM | POA: Diagnosis not present

## 2017-03-01 DIAGNOSIS — R059 Cough, unspecified: Secondary | ICD-10-CM

## 2017-03-01 DIAGNOSIS — R0689 Other abnormalities of breathing: Secondary | ICD-10-CM | POA: Diagnosis not present

## 2017-03-01 DIAGNOSIS — E1140 Type 2 diabetes mellitus with diabetic neuropathy, unspecified: Secondary | ICD-10-CM | POA: Insufficient documentation

## 2017-03-01 DIAGNOSIS — I10 Essential (primary) hypertension: Secondary | ICD-10-CM | POA: Diagnosis not present

## 2017-03-01 DIAGNOSIS — M546 Pain in thoracic spine: Secondary | ICD-10-CM | POA: Diagnosis not present

## 2017-03-01 DIAGNOSIS — Z7984 Long term (current) use of oral hypoglycemic drugs: Secondary | ICD-10-CM | POA: Insufficient documentation

## 2017-03-01 DIAGNOSIS — I2510 Atherosclerotic heart disease of native coronary artery without angina pectoris: Secondary | ICD-10-CM | POA: Diagnosis not present

## 2017-03-01 DIAGNOSIS — I252 Old myocardial infarction: Secondary | ICD-10-CM | POA: Insufficient documentation

## 2017-03-01 LAB — COMPREHENSIVE METABOLIC PANEL
ALT: 48 U/L (ref 17–63)
AST: 28 U/L (ref 15–41)
Albumin: 4.2 g/dL (ref 3.5–5.0)
Alkaline Phosphatase: 51 U/L (ref 38–126)
Anion gap: 9 (ref 5–15)
BUN: 17 mg/dL (ref 6–20)
CO2: 25 mmol/L (ref 22–32)
Calcium: 9.6 mg/dL (ref 8.9–10.3)
Chloride: 102 mmol/L (ref 101–111)
Creatinine, Ser: 0.76 mg/dL (ref 0.61–1.24)
GFR calc Af Amer: >60 mL/min (ref >60)
GFR calc non Af Amer: >60 mL/min (ref >60)
Glucose, Bld: 151 mg/dL — ABNORMAL HIGH (ref 65–99)
Potassium: 4.2 mmol/L (ref 3.5–5.1)
Sodium: 136 mmol/L (ref 135–145)
Total Bilirubin: 1 mg/dL (ref 0.3–1.2)
Total Protein: 7.3 g/dL (ref 6.5–8.1)

## 2017-03-01 LAB — URINALYSIS, ROUTINE W REFLEX MICROSCOPIC
Bacteria, UA: NONE SEEN
Bilirubin Urine: NEGATIVE
Glucose, UA: NEGATIVE mg/dL
Hgb urine dipstick: NEGATIVE
Ketones, ur: 5 mg/dL — AB
Leukocytes, UA: NEGATIVE
Nitrite: NEGATIVE
Protein, ur: 30 mg/dL — AB
Specific Gravity, Urine: 1.011 (ref 1.005–1.030)
Squamous Epithelial / HPF: NONE SEEN
pH: 7 (ref 5.0–8.0)

## 2017-03-01 LAB — I-STAT CHEM 8, ED
BUN: 18 mg/dL (ref 6–20)
Calcium, Ion: 1.15 mmol/L (ref 1.15–1.40)
Chloride: 101 mmol/L (ref 101–111)
Creatinine, Ser: 0.8 mg/dL (ref 0.61–1.24)
Glucose, Bld: 156 mg/dL — ABNORMAL HIGH (ref 65–99)
HCT: 48 % (ref 39.0–52.0)
Hemoglobin: 16.3 g/dL (ref 13.0–17.0)
Potassium: 4.2 mmol/L (ref 3.5–5.1)
Sodium: 138 mmol/L (ref 135–145)
TCO2: 26 mmol/L (ref 0–100)

## 2017-03-01 LAB — CBC WITH DIFFERENTIAL/PLATELET
Basophils Absolute: 0 10*3/uL (ref 0.0–0.1)
Basophils Relative: 0 %
Eosinophils Absolute: 0.2 10*3/uL (ref 0.0–0.7)
Eosinophils Relative: 2 %
HCT: 45.8 % (ref 39.0–52.0)
Hemoglobin: 16.1 g/dL (ref 13.0–17.0)
Lymphocytes Relative: 19 %
Lymphs Abs: 1.9 10*3/uL (ref 0.7–4.0)
MCH: 31.8 pg (ref 26.0–34.0)
MCHC: 35.2 g/dL (ref 30.0–36.0)
MCV: 90.3 fL (ref 78.0–100.0)
Monocytes Absolute: 0.4 10*3/uL (ref 0.1–1.0)
Monocytes Relative: 4 %
Neutro Abs: 7.7 10*3/uL (ref 1.7–7.7)
Neutrophils Relative %: 75 %
Platelets: 144 10*3/uL — ABNORMAL LOW (ref 150–400)
RBC: 5.07 MIL/uL (ref 4.22–5.81)
RDW: 13.1 % (ref 11.5–15.5)
WBC: 10.2 10*3/uL (ref 4.0–10.5)

## 2017-03-01 LAB — I-STAT TROPONIN, ED: Troponin i, poc: 0.03 ng/mL (ref 0.00–0.08)

## 2017-03-01 LAB — BRAIN NATRIURETIC PEPTIDE: B Natriuretic Peptide: 42 pg/mL (ref 0.0–100.0)

## 2017-03-01 LAB — LIPASE, BLOOD: Lipase: 26 U/L (ref 11–51)

## 2017-03-01 MED ORDER — SODIUM CHLORIDE 0.9 % IV SOLN
Freq: Once | INTRAVENOUS | Status: AC
Start: 2017-03-01 — End: 2017-03-01
  Administered 2017-03-01: 03:00:00 via INTRAVENOUS

## 2017-03-01 NOTE — ED Provider Notes (Signed)
[facility] DEPT Provider Note   CSN: [id number] Arrival date & time: 03/01/17  0040  By signing my name below, I, Oleh Genin, attest that this documentation has been prepared under the direction and in the presence of No att. providers found. Electronically Signed: Oleh Genin, Scribe. 03/01/17. 3:09 AM.   History   Chief Complaint Chief Complaint  Patient presents with  . Nausea  . Headache    HPI [name] [name] is a 66 y.o. male with history of A-fib not anticoagulated, diabetes, HLD, and obesity who presents to the ED for evaluation of a headache. This patient states that approximately 6 hours ago he was eating dinner when he had onset of a moderate-severity headache that persisted. He then decided to take his own blood pressure and found himself hypertensive to 200/120 mm Hg. He then called EMS; while in triage he states his headache has resolved. At interview, he is also reporting several other complaints including 2 weeks of intermittent productive cough, congestion, and bilateral thoracic back pain. He also had dark urine earlier this week which resolved. He was seen by primary care with these complaints who performed urinalysis and prescribed tramadol. He does not have a home oxygen requirement, does not take antihypertensives.   HPI  Past Medical History:  Diagnosis Date  . Atrial fibrillation ([place]) 03/22/2009   a. s/p multiple DCCV; b. no coumadin due to low TE risk profile; c. Tikosyn Rx  . Coronary atherosclerosis of native coronary artery 11/2002   a. s/p stent to LAD 12/03; OM2 occluded at cath 12/03; d. myoview 5/10: no ischemia;  e. echo 7/11: EF 55%, BAE, mild RVE, PASP 41-45; Myoview was in March 2013. There was no ischemia or infarction, EF 51%   . Diabetes mellitus without complication ([place])   . Drusen body    see opth note  . ERECTILE DYSFUNCTION 03/22/2009  . GERD 03/22/2009  . HYPERGLYCEMIA 04/25/2010  . HYPERLIPIDEMIA 03/22/2009  . Iliac  aneurysm (HCC)    2.6 to be evaluated incidental finding on CT  . LATERAL EPICONDYLITIS, LEFT 10/24/2009  . LIVER FUNCTION TESTS, ABNORMAL 04/25/2010  . Local reaction to immunization 05/05/2012   minor resolving  zostavax   . Myocardial infarction mi2003  . Numbness in left leg    foot related to back disease and surgery  . Obesity, unspecified 04/24/2009  . Perforated appendicitis with necrosis s/p open appendectomy 06/07/14 06/04/2014  . Renal cyst    Characterized by MRI as simple  . Ruptured suppurative appendicitis    2015   . SLEEP APNEA, OBSTRUCTIVE 03/22/2009   compliant with CPAP  . THROMBOCYTOPENIA 08/16/2010  . TOBACCO USE, QUIT 10/24/2009  . 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 Umbilical hernia     Patient Active Problem List   Diagnosis Date Noted  . Thrombocytopenia ([place]) 06/25/2016  . Atrial fibrillation ([place]) 01/01/2016  . Iliac aneurysm ([place])   . Morbid obesity ([place]) 07/08/2014  . Type 2 diabetes mellitus with other circulatory complications 0000000  . Cutaneous abscess of back excluding buttocks 07/04/2014  . Chronic anticoagulation 12/21/2013  . Sleep disorder 06/21/2013  . Decreased hearing 08/06/2012  . Hemorrhoids 08/06/2012  . High risk medication use 05/05/2012  . Anticoagulation management encounter 05/05/2012  . Retinal tear 04/29/2012  . Personal history of colonic polyps 05/05/2011  . Visit for preventive health examination 05/05/2011  . PALPITATIONS 08/16/2010  . LIVER FUNCTION TESTS, ABNORMAL 04/25/2010  . Lateral epicondylitis 10/24/2009  . TOBACCO USE, QUIT 10/24/2009  .  OBESITY, UNSPECIFIED 04/24/2009  . Hyperlipidemia 03/22/2009  . ERECTILE DYSFUNCTION 03/22/2009  . Obstructive sleep apnea 03/22/2009  . 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 MYOCARDIAL INFARCTION, HX OF 03/22/2009  . Coronary atherosclerosis 03/22/2009  . Atrial fibrillation with RVR ([place]) 03/22/2009  . GERD 03/22/2009    Past Surgical History:  Procedure Laterality  Date  . APPENDECTOMY N/A 06/06/2014   Procedure: APPENDECTOMY;  Surgeon: Odis Hollingshead, MD;  Location: [facility] ORS;  Service: General;  Laterality: N/A;  . COLON SURGERY    . COLONOSCOPY  11/15/2011   Procedure: COLONOSCOPY;  Surgeon: Juanita Craver, MD;  Location: [facility];  Service: Endoscopy;  Laterality: N/A;  . COLONOSCOPY WITH PROPOFOL N/A 01/03/2017   Procedure: COLONOSCOPY WITH PROPOFOL;  Surgeon: Carol Ada, MD;  Location: [facility] ENDOSCOPY;  Service: Endoscopy;  Laterality: N/A;  . cyst on epiglottis  08/2002  . ESOPHAGOGASTRODUODENOSCOPY  11/15/2011   Procedure: ESOPHAGOGASTRODUODENOSCOPY (EGD);  Surgeon: Juanita Craver, MD;  Location: [facility];  Service: Endoscopy;  Laterality: N/A;  . LAPAROSCOPIC APPENDECTOMY N/A 06/06/2014   Procedure: APPENDECTOMY LAPAROSCOPIC attemted;  Surgeon: Odis Hollingshead, MD;  Location: [facility];  Service: General;  Laterality: N/A;  . LUMBAR DISC SURGERY     two  holes in spinalcovering  . stent     LAD DUS 2004  . surgery l4-l5   1998   ruptured x 3  . ulnar neuropathy    . UMBILICAL HERNIA REPAIR     mesh       Home Medications    Prior to Admission medications   Medication Sig Start Date End Date Taking? Authorizing Provider  acetaminophen (TYLENOL) 325 MG tablet Take 650 mg by mouth every 6 (six) hours as needed for mild pain.   Yes Historical Provider, MD  albuterol (PROVENTIL HFA;VENTOLIN HFA) 108 (90 BASE) MCG/ACT inhaler Inhale 2 puffs into the lungs every 6 (six) hours as needed for wheezing or shortness of breath. 10/06/15  Yes Burnis Medin, MD  atorvastatin (LIPITOR) 80 MG tablet Take 1 tablet by mouth  daily Patient taking differently: Take 80mg  daily in the evening 04/08/16  Yes Burnis Medin, MD  diphenhydrAMINE (BENADRYL) 25 MG tablet Take 25 mg by mouth at bedtime as needed for sleep.   Yes Historical Provider, MD  dofetilide (TIKOSYN) 500 MCG capsule Take 1 capsule by mouth two times daily 05/23/16  Yes Lelon Perla, MD    famotidine (PEPCID AC) 10 MG chewable tablet Chew 10 mg by mouth daily as needed for heartburn.   Yes Historical Provider, MD  fish oil-omega-3 fatty acids 1000 MG capsule Take 1 g by mouth 2 (two) times daily.    Yes Historical Provider, MD  KLOR-CON M20 20 MEQ tablet TAKE 1 TABLET BY MOUTH EVERY DAY 07/29/16  Yes Lelon Perla, MD  Magnesium Oxide 500 MG TABS Take 750 mg by mouth daily.   Yes Historical Provider, MD  metFORMIN (GLUCOPHAGE-XR) 500 MG 24 hr tablet TAKE 3 TABLETS BY MOUTH  DAILY WITH BREAKFAST Patient taking differently: Take one tablet by mouth three times daily. 01/22/17  Yes Burnis Medin, MD  methocarbamol (ROBAXIN) 500 MG tablet Take 2 tablets (1,000 mg total) by mouth every 6 (six) hours as needed for muscle spasms. 02/27/17  Yes Burnis Medin, MD  MULTIPLE VITAMIN PO Take 1 tablet by mouth every evening.    Yes Historical Provider, MD  nitroGLYCERIN (NITROSTAT) 0.4 MG SL tablet Place 1 tablet (0.4 mg total) under the  tongue every 5 (five) minutes as needed for chest pain. 11/18/16  Yes Lelon Perla, MD  pantoprazole (PROTONIX) 40 MG tablet TAKE 1 TABLET (40 MG TOTAL) BY MOUTH DAILY. 07/11/16  Yes Burnis Medin, MD  PRADAXA 150 MG CAPS capsule TAKE 1 CAPSULE BY MOUTH  EVERY 12 HOURS 11/01/16  Yes Burnis Medin, MD  Suvorexant (BELSOMRA) 20 MG TABS Take 1 tablet by mouth at bedtime. 12/20/16  Yes Burnis Medin, MD  traMADol (ULTRAM) 50 MG tablet Take 1 tablet (50 mg total) by mouth every 8 (eight) hours as needed for moderate pain. 02/27/17  Yes Burnis Medin, MD    Family History Family History  Problem Relation Age of Onset  . Thyroid disease Mother   . Ovarian cancer Mother   . Breast cancer Mother   . Lung cancer Father   . Cancer Father     Social History Social History  Substance Use Topics  . Smoking status: Former Smoker    Packs/day: 2.00    Years: 42.00    Types: Cigarettes    Quit date: 12/30/2008  . Smokeless tobacco: Never Used     Comment:  started at age 15; 1-2 ppd; quit 2010  . Alcohol use No     Comment: rarely; maybe 1 beer a year     Allergies   Penicillins; Procaine hcl; Pseudoephedrine; Sulfonamide derivatives; and Cardizem [diltiazem]   Review of Systems Review of Systems 10 systems reviewed and all are negative for acute change except as noted in the HPI.  Physical Exam Updated Vital Signs BP 126/66 (BP Location: Right Arm)   Pulse 60   Temp 98.4 F (36.9 C) (Oral)   Resp 18   Ht 6\' 2"  (1.88 m)   Wt (!) 320 lb (145.2 kg)   SpO2 95%   BMI 41.09 kg/m   Physical Exam  Constitutional: He is oriented to person, place, and time. He appears well-developed and well-nourished.  HENT:  Mouth/Throat: Oropharynx is clear and moist.  Eyes: EOM are normal. Pupils are equal, round, and reactive to light.  Cardiovascular: Normal rate, regular rhythm and normal heart sounds.  Exam reveals no gallop and no friction rub.   No murmur heard. Pulmonary/Chest: Effort normal and breath sounds normal. No respiratory distress. He has no wheezes. He has no rales. He exhibits no tenderness.  Abdominal: Soft. Bowel sounds are normal. There is no tenderness. There is no rebound and no guarding.  Musculoskeletal:  No peripheral edema or calf tenderness. Skin changes consistent with chronic venostasis.   Neurological: He is alert and oriented to person, place, and time. No cranial nerve deficit. He exhibits normal muscle tone. Coordination normal.  Skin: Skin is warm and dry.  Psychiatric: He has a normal mood and affect. His behavior is normal. Thought content normal.     ED Treatments / Results  DIAGNOSTIC STUDIES: Oxygen Saturation is 91 percent on room air which is adequate by my interpretation.    COORDINATION OF CARE: 2:59 AM Discussed treatment plan with pt at bedside and pt agreed to plan.  Labs (all labs ordered are listed, but only abnormal results are displayed) Labs Reviewed  COMPREHENSIVE METABOLIC PANEL -  Abnormal; Notable for the following:       Result Value   Glucose, Bld 151 (*)    All other components within normal limits  CBC WITH DIFFERENTIAL/PLATELET - Abnormal; Notable for the following:    Platelets 144 (*)    All  other components within normal limits  URINALYSIS, ROUTINE W REFLEX MICROSCOPIC - Abnormal; Notable for the following:    Ketones, ur 5 (*)    Protein, ur 30 (*)    All other components within normal limits  I-STAT CHEM 8, ED - Abnormal; Notable for the following:    Glucose, Bld 156 (*)    All other components within normal limits  LIPASE, BLOOD  BRAIN NATRIURETIC PEPTIDE  I-STAT TROPOININ, ED    EKG  EKG Interpretation  Date/Time:  Saturday March 01 2017 05:55:56 EST Ventricular Rate:  65 PR Interval:    QRS Duration: 115 QT Interval:  451 QTC Calculation: 469 R Axis:   -10 Text Interpretation:  Sinus rhythm Incomplete right bundle branch block Low voltage, precordial leads since previous tracing, A fib converted to sinus rhythm Confirmed by LITTLE MD, RACHEL [id number]) on 03/02/2017 1:27:23 PM       Radiology No results found.  Procedures Procedures (including critical care time)  Medications Ordered in ED Medications  0.9 %  sodium chloride infusion ( Intravenous Stopped 03/01/17 1112)     Initial Impression / Assessment and Plan / ED Course  I have reviewed the triage vital signs and the nursing notes.  Pertinent labs & imaging results that were available during my care of the patient were reviewed by me and considered in my medical decision making (see chart for details).     Final Clinical Impressions(s) / ED Diagnoses   Final diagnoses:  Essential hypertension  Cough  Acute thoracic back pain, unspecified back pain laterality   Patient experienced multiple symptoms. Hypertension has resolved. Patient has normal neurologic examination. He also describes thoracic discomfort and recent cough. At this point, diagnostic workup does not show  ischemic changes or pneumonia. Will plan for discharge if patient does not have any evidence of acute biliary dysfunction.  New Prescriptions Discharge Medication List as of 03/01/2017 11:05 AM       Charlesetta Shanks, MD 03/11/17 [phone]

## 2017-03-01 NOTE — Discharge Instructions (Signed)
Follow-up with your doctor for the vascular congestion seen on chest x-ray. Return here at once should you develop chest pain, shortness of breath, persistent headache.

## 2017-03-01 NOTE — ED Triage Notes (Signed)
Per EMS pt states he just does not feel well today  Pt is c/o nausea, vomiting, headache, generalized weakness  Pt states he was seen by his dr yesterday due to flank pain but his urinalysis was negative  No blood work was done   Pt denies fever or diarrhea  Pt states he feels bloated and has had a decreased appetite today   EMS started an IV and gave Zofran 4mg  and did orthostatic VS that were negative for change

## 2017-03-01 NOTE — ED Provider Notes (Addendum)
Patient seen by another provider after coming in for multiple complaints. I spoke with the patient and he states that his major issue is that he had headache as well as high blood pressure. Patient's headache is resolved. He had no confusion or vomiting. No neurological findings. He also had some flank pain. Patient's workup here consisting of an abdominal ultrasound as well as chest x-ray was noted. His chest x-ray shows mild congestion but he has a negative BNP and troponin. He has no new dyspnea. The ultrasound shows a stable gallstone. Patient has no abdominal pain this time. He feels as baseline at this time. His blood pressure has been monitored here and has been stable. Will follow-up with his doctor.   Lacretia Leigh, MD 03/01/17 1103    Lacretia Leigh, MD 03/01/17 [phone]

## 2017-03-01 NOTE — ED Notes (Signed)
Patient understood discharge instructions.  He is A & O x4.

## 2017-03-01 NOTE — ED Notes (Signed)
ED Provider at bedside. 

## 2017-03-24 ENCOUNTER — Ambulatory Visit (INDEPENDENT_AMBULATORY_CARE_PROVIDER_SITE_OTHER): Payer: Medicare Other | Admitting: Internal Medicine

## 2017-03-24 ENCOUNTER — Encounter: Payer: Self-pay | Admitting: Internal Medicine

## 2017-03-24 ENCOUNTER — Other Ambulatory Visit: Payer: Self-pay | Admitting: Internal Medicine

## 2017-03-24 VITALS — BP 150/70 | HR 55 | Temp 97.7°F | Ht 73.0 in | Wt 319.8 lb

## 2017-03-24 DIAGNOSIS — R062 Wheezing: Secondary | ICD-10-CM | POA: Diagnosis not present

## 2017-03-24 DIAGNOSIS — Z7901 Long term (current) use of anticoagulants: Secondary | ICD-10-CM | POA: Diagnosis not present

## 2017-03-24 DIAGNOSIS — J069 Acute upper respiratory infection, unspecified: Secondary | ICD-10-CM

## 2017-03-24 DIAGNOSIS — J40 Bronchitis, not specified as acute or chronic: Secondary | ICD-10-CM | POA: Diagnosis not present

## 2017-03-24 DIAGNOSIS — R05 Cough: Secondary | ICD-10-CM | POA: Diagnosis not present

## 2017-03-24 DIAGNOSIS — R053 Chronic cough: Secondary | ICD-10-CM

## 2017-03-24 MED ORDER — DOXYCYCLINE HYCLATE 100 MG PO TABS: 100.0000 mg | ORAL_TABLET | Freq: Two times a day (BID) | ORAL | 0 refills | Status: DC

## 2017-03-24 MED ORDER — METHYLPREDNISOLONE ACETATE 80 MG/ML IJ SUSP
120.0000 mg | Freq: Once | INTRAMUSCULAR | Status: AC
  Administered 2017-03-24: 120 mg via INTRAMUSCULAR

## 2017-03-24 MED ORDER — ALBUTEROL SULFATE HFA 108 (90 BASE) MCG/ACT IN AERS: 2.0000 | INHALATION_SPRAY | Freq: Four times a day (QID) | RESPIRATORY_TRACT | 0 refills | Status: AC | PRN

## 2017-03-24 NOTE — Patient Instructions (Addendum)
This acts like a wheezy bronchitis  . Steroid can increase the blood sugar  Temporarily but  Helps the inflammation in the bronchial tubes . Antibiotic   may or may not help.  But would add cause of  Length of illness.   Albuterol inhaler as needed    Until better   Contact us if not getting better in the next 3-5 days or worse.   consdier chest x ray and reaevaluation.

## 2017-03-24 NOTE — Progress Notes (Signed)
Chief Complaint  Patient presents with  . Cough    started five weeks ago   . Shortness of Breath  . tighten in chest    HPI: [name] [name]. 66 y.o.  sda  CC may have had to chest and head cold just haven't gone away. Originally in February and I saw him at the beginning of March as he was getting better soon after he had increasing symptoms of nasal congestion sinus pressure and increasing cough. Most recently he has had shortness of breath going up stairs or taking a walk. No specific fever or hemoptysis limit is mostly clear but very thick. No chest pain change in his atrial fib. Can take a decongestant wonders what to do next. 1200 mg guiafensin .  No fever. ROS: See pertinent positives and negatives per HPI.  Past Medical History:  Diagnosis Date  . Atrial fibrillation ([place]) 03/22/2009   a. s/p multiple DCCV; b. no coumadin due to low TE risk profile; c. Tikosyn Rx  . Coronary atherosclerosis of native coronary artery 11/2002   a. s/p stent to LAD 12/03; OM2 occluded at cath 12/03; d. myoview 5/10: no ischemia;  e. echo 7/11: EF 55%, BAE, mild RVE, PASP 41-45; Myoview was in March 2013. There was no ischemia or infarction, EF 51%   . Diabetes mellitus without complication ([place])   . Drusen body    see opth note  . ERECTILE DYSFUNCTION 03/22/2009  . GERD 03/22/2009  . HYPERGLYCEMIA 04/25/2010  . HYPERLIPIDEMIA 03/22/2009  . 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 LATERAL EPICONDYLITIS, LEFT 10/24/2009  . LIVER FUNCTION TESTS, ABNORMAL 04/25/2010  . Local reaction to immunization 05/05/2012   minor resolving  zostavax   . Myocardial infarction mi2003  . Numbness in left leg    foot related to back disease and surgery  . Obesity, unspecified 04/24/2009  . Perforated appendicitis with necrosis s/p open appendectomy 06/07/14 06/04/2014  . Renal cyst    Characterized by MRI as simple  . Ruptured suppurative appendicitis    2015   . SLEEP APNEA,  OBSTRUCTIVE 03/22/2009   compliant with CPAP  . THROMBOCYTOPENIA 08/16/2010  . TOBACCO USE, QUIT 10/24/2009  . 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 Umbilical hernia     Family History  Problem Relation Age of Onset  . Thyroid disease Mother   . Ovarian cancer Mother   . Breast cancer Mother   . Lung cancer Father   . Cancer Father     Social History   Social History  . Marital status: Married    Spouse name: N/A  . Number of children: N/A  . Years of education: N/A   Social History Main Topics  . Smoking status: Former Smoker    Packs/day: 2.00    Years: 42.00    Types: Cigarettes    Quit date: 12/30/2008  . Smokeless tobacco: Never Used     Comment: started at age 87; 1-2 ppd; quit 2010  . Alcohol use No     Comment: rarely; maybe 1 beer a year  . Drug use: No  . Sexual activity: Yes   Other Topics Concern  . None   Social History Narrative   Retired paramedic   Regular exercise-yes not recently    Has children   Wife is overweight and has fibromyalgia and depression on disability doesn't go out much. Back surgery    Has older dog  Retired from [employer] years of service now 7 years     Outpatient Medications Prior to Visit  Medication Sig Dispense Refill  . atorvastatin (LIPITOR) 80 MG tablet Take 1 tablet by mouth  daily (Patient taking differently: Take 80mg  daily in the evening) 90 tablet 3  . diphenhydrAMINE (BENADRYL) 25 MG tablet Take 25 mg by mouth at bedtime as needed for sleep.    [name] dofetilide (TIKOSYN) 500 MCG capsule Take 1 capsule by mouth two times daily 180 capsule 3  . famotidine (PEPCID AC) 10 MG chewable tablet Chew 10 mg by mouth daily as needed for heartburn.    . fish oil-omega-3 fatty acids 1000 MG capsule Take 1 g by mouth 2 (two) times daily.     [name] KLOR-CON M20 20 MEQ tablet TAKE 1 TABLET BY MOUTH EVERY DAY 90 tablet 2  . Magnesium Oxide 500 MG TABS Take 750 mg by mouth daily.    . metFORMIN (GLUCOPHAGE-XR) 500 MG 24 hr tablet TAKE 3  TABLETS BY MOUTH  DAILY WITH BREAKFAST (Patient taking differently: Take one tablet by mouth three times daily.) 270 tablet 1  . methocarbamol (ROBAXIN) 500 MG tablet Take 2 tablets (1,000 mg total) by mouth every 6 (six) hours as needed for muscle spasms. 40 tablet 1  . MULTIPLE VITAMIN PO Take 1 tablet by mouth every evening.     . nitroGLYCERIN (NITROSTAT) 0.4 MG SL tablet Place 1 tablet (0.4 mg total) under the tongue every 5 (five) minutes as needed for chest pain. 25 tablet 12  . pantoprazole (PROTONIX) 40 MG tablet TAKE 1 TABLET (40 MG TOTAL) BY MOUTH DAILY. 90 tablet 1  . PRADAXA 150 MG CAPS capsule TAKE 1 CAPSULE BY MOUTH  EVERY 12 HOURS 180 capsule 1  . Suvorexant (BELSOMRA) 20 MG TABS Take 1 tablet by mouth at bedtime. 90 tablet 0  . traMADol (ULTRAM) 50 MG tablet Take 1 tablet (50 mg total) by mouth every 8 (eight) hours as needed for moderate pain. 30 tablet 0  . albuterol (PROVENTIL HFA;VENTOLIN HFA) 108 (90 BASE) MCG/ACT inhaler Inhale 2 puffs into the lungs every 6 (six) hours as needed for wheezing or shortness of breath. 1 Inhaler 1  . acetaminophen (TYLENOL) 325 MG tablet Take 650 mg by mouth every 6 (six) hours as needed for mild pain.     No facility-administered medications prior to visit.      EXAM:  BP (!) 150/70 (BP Location: Right Arm, Patient Position: Sitting, Cuff Size: Large)   Pulse (!) 55   Temp 97.7 F (36.5 C) (Oral)   Ht 6\' 1"  (1.854 m)   Wt (!) 319 lb 12.8 oz (145.1 kg)   SpO2 95%   BMI 42.19 kg/m   Body mass index is 42.19 kg/m. WDWN in NAD  quiet respirations; very congested  congested  somewhat hoarse. Non toxic . HEENT: Normocephalic ;atraumatic , Eyes;  PERRL, EOMs  Full, lids and conjunctiva clear,,Ears: no deformities, canals nl, TM landmarks normal, Nose: no deformity or discharge but congested;face  Non tender Mouth : OP clear without lesion or edema . Neck: Supple without adenopathy or masses or bruits Chest:   Diffuse musicl wheezing    Tight   After neb albuteroll inc air movement  No rales Feel some better respiratory wise. CV:  S1-S2 no gallops or murmurs peripheral perfusion is normal Skin :nl perfusion and no acute rashes   G ASSESSMENT AND PLAN:  Discussed the following assessment and plan:  Wheezing  Wheezy bronchitis  Cough, persistent - Plan: methylPREDNISolone acetate (DEPO-MEDROL) injection 120 mg  Protracted URI  Chronic anticoagulation Treatment Depo-Medrol albuterol added antibiotic because of prolonged respiratory illness possible sinusitis etc. Warn him that steroids will increase his blood sugar temporarily. By the time he left the office his dyspnea had improved also. Discussed use of albuterol I don't think it's a risk for his atrial fib if  cautious.   Expectant management.  -Patient advised to return or notify health care team  if symptoms worsen ,persist or new concerns arise.  Patient Instructions  This acts like a wheezy bronchitis  . Steroid can increase the blood sugar  Temporarily but  Helps the inflammation in the bronchial tubes . Antibiotic   may or may not help.  But would add cause of  Length of illness.   Albuterol inhaler as needed    Until better   Contact us if not getting better in the next 3-5 days or worse.   consdier chest x ray and reaevaluation.     Standley Brooking. Panosh M.D.

## 2017-04-15 ENCOUNTER — Other Ambulatory Visit: Payer: Self-pay | Admitting: Cardiology

## 2017-04-16 ENCOUNTER — Other Ambulatory Visit: Payer: Self-pay | Admitting: Internal Medicine

## 2017-04-16 NOTE — Telephone Encounter (Signed)
Rx(s) sent to pharmacy electronically.  

## 2017-04-17 ENCOUNTER — Encounter: Payer: Self-pay | Admitting: Internal Medicine

## 2017-04-18 ENCOUNTER — Telehealth: Payer: Self-pay | Admitting: Emergency Medicine

## 2017-04-18 MED ORDER — SUVOREXANT 20 MG PO TABS: 1.0000 | ORAL_TABLET | Freq: Every day | ORAL | 1 refills | Status: DC

## 2017-04-18 NOTE — Telephone Encounter (Signed)
Ok to refill 90 days x 2 ( 6 months  [facility])

## 2017-04-18 NOTE — Telephone Encounter (Signed)
Pt would like a refill for Suvorexant 20 mg tab. Please advise.    [facility]

## 2017-04-18 NOTE — Telephone Encounter (Signed)
Prescription for Suvorexant faxed in 6 month supply

## 2017-04-18 NOTE — Addendum Note (Signed)
Addended by: Milford Cage on: 04/18/2017 10:13 AM   Modules accepted: Orders

## 2017-05-05 ENCOUNTER — Encounter: Payer: Self-pay | Admitting: Cardiology

## 2017-05-15 NOTE — Progress Notes (Signed)
HPI: FU hx of CAD s/p stent to LAD in 2003, parox AFib, HL, OSA. He has seen Dr. Rayann Heman and ablation could be considered if he lost weight. Previous rash with cardizem; on tikosyn. Echo 6/17 showed normal function; grade 1 DD; mild LAE. Iliac aneurysm followed by vascular surgery. Has had previous cardioversions for atrial fibrillation. His last cardioversion was in December 2017. Since last seen, the patient has dyspnea with more extreme activities but not with routine activities. It is relieved with rest. It is not associated with chest pain. There is no orthopnea, PND or pedal edema. There is no syncope or palpitations. There is no exertional chest pain.   Current Outpatient Prescriptions  Medication Sig Dispense Refill  . acetaminophen (TYLENOL) 325 MG tablet Take 650 mg by mouth every 6 (six) hours as needed for mild pain.    [name] albuterol (PROVENTIL HFA;VENTOLIN HFA) 108 (90 Base) MCG/ACT inhaler Inhale 2 puffs into the lungs every 6 (six) hours as needed for wheezing or shortness of breath. 1 Inhaler 0  . atorvastatin (LIPITOR) 80 MG tablet TAKE 1 TABLET BY MOUTH  DAILY 90 tablet 0  . diphenhydrAMINE (BENADRYL) 25 MG tablet Take 25 mg by mouth at bedtime as needed for sleep.    [name] dofetilide (TIKOSYN) 500 MCG capsule TAKE 1 CAPSULE BY MOUTH TWO TIMES DAILY 180 capsule 1  . doxycycline (VIBRA-TABS) 100 MG tablet Take 1 tablet (100 mg total) by mouth 2 (two) times daily. 14 tablet 0  . famotidine (PEPCID AC) 10 MG chewable tablet Chew 10 mg by mouth daily as needed for heartburn.    . fish oil-omega-3 fatty acids 1000 MG capsule Take 1 g by mouth 2 (two) times daily.     [name] KLOR-CON M20 20 MEQ tablet TAKE 1 TABLET BY MOUTH EVERY DAY 90 tablet 2  . Magnesium Oxide 500 MG TABS Take 750 mg by mouth daily.    . metFORMIN (GLUCOPHAGE-XR) 500 MG 24 hr tablet TAKE 3 TABLETS BY MOUTH  DAILY WITH BREAKFAST (Patient taking differently: Take one tablet by mouth three times daily.) 270 tablet 1  .  methocarbamol (ROBAXIN) 500 MG tablet Take 2 tablets (1,000 mg total) by mouth every 6 (six) hours as needed for muscle spasms. 40 tablet 1  . MULTIPLE VITAMIN PO Take 1 tablet by mouth every evening.     . nitroGLYCERIN (NITROSTAT) 0.4 MG SL tablet Place 1 tablet (0.4 mg total) under the tongue every 5 (five) minutes as needed for chest pain. 25 tablet 12  . pantoprazole (PROTONIX) 40 MG tablet TAKE 1 TABLET (40 MG TOTAL) BY MOUTH DAILY. 90 tablet 1  . PRADAXA 150 MG CAPS capsule TAKE 1 CAPSULE BY MOUTH  EVERY 12 HOURS 180 capsule 1  . Suvorexant (BELSOMRA) 20 MG TABS Take 1 tablet by mouth at bedtime. 90 tablet 1  . traMADol (ULTRAM) 50 MG tablet Take 1 tablet (50 mg total) by mouth every 8 (eight) hours as needed for moderate pain. 30 tablet 0   No current facility-administered medications for this visit.      Past Medical History:  Diagnosis Date  . Atrial fibrillation ([place]) 03/22/2009   a. s/p multiple DCCV; b. no coumadin due to low TE risk profile; c. Tikosyn Rx  . Coronary atherosclerosis of native coronary artery 11/2002   a. s/p stent to LAD 12/03; OM2 occluded at cath 12/03; d. myoview 5/10: no ischemia;  e. echo 7/11: EF 55%, BAE, mild RVE, PASP  41-45; Myoview was in March 2013. There was no ischemia or infarction, EF 51%   . Diabetes mellitus without complication ([place])   . Drusen body    see opth note  . ERECTILE DYSFUNCTION 03/22/2009  . GERD 03/22/2009  . HYPERGLYCEMIA 04/25/2010  . HYPERLIPIDEMIA 03/22/2009  . 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 LATERAL EPICONDYLITIS, LEFT 10/24/2009  . LIVER FUNCTION TESTS, ABNORMAL 04/25/2010  . Local reaction to immunization 05/05/2012   minor resolving  zostavax   . Myocardial infarction ([place]) mi2003  . Numbness in left leg    foot related to back disease and surgery  . Obesity, unspecified 04/24/2009  . Perforated appendicitis with necrosis s/p open appendectomy 06/07/14 06/04/2014  . Renal cyst     Characterized by MRI as simple  . Ruptured suppurative appendicitis    2015   . SLEEP APNEA, OBSTRUCTIVE 03/22/2009   compliant with CPAP  . THROMBOCYTOPENIA 08/16/2010  . TOBACCO USE, QUIT 10/24/2009  . 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 Umbilical hernia     Past Surgical History:  Procedure Laterality Date  . APPENDECTOMY N/A 06/06/2014   Procedure: APPENDECTOMY;  Surgeon: Odis Hollingshead, MD;  Location: [facility];  Service: General;  Laterality: N/A;  . COLON SURGERY    . COLONOSCOPY  11/15/2011   Procedure: COLONOSCOPY;  Surgeon: Juanita Craver, MD;  Location: [facility];  Service: Endoscopy;  Laterality: N/A;  . COLONOSCOPY WITH PROPOFOL N/A 01/03/2017   Procedure: COLONOSCOPY WITH PROPOFOL;  Surgeon: Carol Ada, MD;  Location: [facility];  Service: Endoscopy;  Laterality: N/A;  . cyst on epiglottis  08/2002  . ESOPHAGOGASTRODUODENOSCOPY  11/15/2011   Procedure: ESOPHAGOGASTRODUODENOSCOPY (EGD);  Surgeon: Juanita Craver, MD;  Location: [facility];  Service: Endoscopy;  Laterality: N/A;  . LAPAROSCOPIC APPENDECTOMY N/A 06/06/2014   Procedure: APPENDECTOMY LAPAROSCOPIC attemted;  Surgeon: Odis Hollingshead, MD;  Location: [facility];  Service: General;  Laterality: N/A;  . LUMBAR DISC SURGERY     two  holes in spinalcovering  . stent     LAD DUS 2004  . surgery l4-l5   1998   ruptured x 3  . ulnar neuropathy    . UMBILICAL HERNIA REPAIR     mesh    Social History   Social History  . Marital status: Married    Spouse name: N/A  . Number of children: N/A  . Years of education: N/A   Occupational History  . Not on file.   Social History Main Topics  . Smoking status: Former Smoker    Packs/day: 2.00    Years: 42.00    Types: Cigarettes    Quit date: 12/30/2008  . Smokeless tobacco: Never Used     Comment: started at age 41; 1-2 ppd; quit 2010  . Alcohol use No     Comment: rarely; maybe 1 beer a year  . Drug use: No  . Sexual activity: Yes   Other Topics Concern  .  Not on file   Social History Narrative   Retired paramedic   Regular exercise-yes not recently    Has children   Wife is overweight and has fibromyalgia and depression on disability doesn't go out much. Back surgery    Has older dog   Retired from [employer] 31 years of service now 7 years     Family History  Problem Relation Age of Onset  . Thyroid disease Mother   . Ovarian cancer Mother   .  Breast cancer Mother   . Lung cancer Father   . Cancer Father     ROS: no fevers or chills, productive cough, hemoptysis, dysphasia, odynophagia, melena, hematochezia, dysuria, hematuria, rash, seizure activity, orthopnea, PND, pedal edema, claudication. Remaining systems are negative.  Physical Exam: Well-developed obese in no acute distress.  Skin is warm and dry.  HEENT is normal.  Neck is supple.  Chest is clear to auscultation with normal expansion.  Cardiovascular exam is regular rate and rhythm.  Abdominal exam nontender or distended. No masses palpated. Extremities show no edema. neuro grossly intact  A/P  1 paroxysmal atrial fibrillation-patient remains in sinus rhythm on examination. Continue tikosyn and pradaxa. I have asked him to continue attempts at weight loss. We could ask Dr. Rayann Heman to evaluate him again in the future for atrial fibrillation if he loses additional weight.  2 coronary artery disease-continue statin. No aspirin given need for anticoagulation.  3 hyperlipidemia-continue statin.  4 iliac aneurysm-followed by vascular surgery.  5 obesity-we discussed the importance of weight loss.  [name], MD

## 2017-05-20 ENCOUNTER — Encounter: Payer: Self-pay | Admitting: Cardiology

## 2017-05-20 ENCOUNTER — Ambulatory Visit (INDEPENDENT_AMBULATORY_CARE_PROVIDER_SITE_OTHER): Payer: Medicare Other | Admitting: Cardiology

## 2017-05-20 VITALS — BP 118/66 | HR 64 | Ht 73.0 in | Wt 319.0 lb

## 2017-05-20 DIAGNOSIS — E7800 Pure hypercholesterolemia, unspecified: Secondary | ICD-10-CM

## 2017-05-20 DIAGNOSIS — I2510 Atherosclerotic heart disease of native coronary artery without angina pectoris: Secondary | ICD-10-CM

## 2017-05-20 DIAGNOSIS — I480 Paroxysmal atrial fibrillation: Secondary | ICD-10-CM | POA: Diagnosis not present

## 2017-05-20 NOTE — Patient Instructions (Signed)
Your physician wants you to follow-up in: 6 MONTHS WITH DR CRENSHAW You will receive a reminder letter in the mail two months in advance. If you don't receive a letter, please call our office to schedule the follow-up appointment.   If you need a refill on your cardiac medications before your next appointment, please call your pharmacy.  

## 2017-06-12 ENCOUNTER — Other Ambulatory Visit: Payer: Self-pay | Admitting: Internal Medicine

## 2017-06-18 ENCOUNTER — Other Ambulatory Visit: Payer: Self-pay | Admitting: Internal Medicine

## 2017-06-26 NOTE — Progress Notes (Signed)
Chief Complaint  Patient presents with  . Annual Exam  . Diabetes  . Medication Management    HPI: [name] [name]. 66 y.o. comes in today for Preventive Medicare exam visit .  And Chronic disease management  DM   Home glucometer going down .  No low  considiering ablation if loising wegiht. Below 300  PAF No falling  Eyes utd checking  Has some hearing loss sees dr Warren Danes  Bp stable   No new neur sx no bleeding    Health Maintenance  Topic Date Due  . URINE MICROALBUMIN  06/18/2017  . FOOT EXAM  06/28/2017  . HEMOGLOBIN A1C  07/23/2017  . INFLUENZA VACCINE  07/30/2017  . OPHTHALMOLOGY EXAM  10/02/2017  . TETANUS/TDAP  04/05/2025  . COLONOSCOPY  01/03/2027  . Hepatitis C Screening  Completed  . PNA vac Low Risk Adult  Completed   Health Maintenance Review LIFESTYLE:  Exercise:  Yard work and errands  A,mile + per day .  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 Last eye check UTD  Safety:  Has smoke detector and wears seat belts.  No firearms. No excess sun exposure. Sees dentist regularly.  Falls: n  Memory: Felt to be good  , no concern from her or her family.  Depression: No anhedonia unusual crying or depressive symptoms  Nutrition: Eats well balanced diet; adequate calcium and vitamin D. No swallowing chewing problems.  Injury: no major injuries in the last six months.  Other healthcare providers:  Reviewed today .  Social:  Lives with spouse married.   Preventive parameters: up-to-date  Reviewed   ADLS:   There are no problems or need for assistance  driving, feeding, obtaining food, dressing, toileting and bathing, managing money using phone.  is independent. takes care of disabled wife     ROS:  Numbness left toe prob from  oks back surgery and  complications   GEN/ HEENT: No fever, significant weight changes sweats headaches vision problems hearing changes, CV/ PULM; No chest pain shortness of breath cough, syncope,edema  change in exercise tolerance. GI /GU: No adominal pain, vomiting, change in bowel habits. No blood in the stool. No significant GU symptoms. SKIN/HEME: ,no acute skin rashes suspicious lesions or bleeding. No lymphadenopathy, nodules, masses.  NEURO/ PSYCH:  No  NEW neurologic signs such as weaknessNo depression anxiety. IMM/ Allergy: No unusual infections.  Allergy .   REST of 12 system review negative except as per HPI   Past Medical History:  Diagnosis Date  . Atrial fibrillation ([place]) 03/22/2009   a. s/p multiple DCCV; b. no coumadin due to low TE risk profile; c. Tikosyn Rx  . Coronary atherosclerosis of native coronary artery 11/2002   a. s/p stent to LAD 12/03; OM2 occluded at cath 12/03; d. myoview 5/10: no ischemia;  e. echo 7/11: EF 55%, BAE, mild RVE, PASP 41-45; Myoview was in March 2013. There was no ischemia or infarction, EF 51%   . Cutaneous abscess of back excluding buttocks 07/04/2014   Appears to stem from possibly a cyst very large area 6 cm contact surgeon office   . Diabetes mellitus without complication ([place])   . Drusen body    see opth note  .  ERECTILE DYSFUNCTION 03/22/2009  . GERD 03/22/2009  . HYPERGLYCEMIA 04/25/2010  . HYPERLIPIDEMIA 03/22/2009  . 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 LATERAL EPICONDYLITIS, LEFT 10/24/2009  . LIVER FUNCTION TESTS, ABNORMAL 04/25/2010  . Local reaction to immunization 05/05/2012   minor resolving  zostavax   . Myocardial infarction ([place]) mi2003  . Numbness in left leg    foot related to back disease and surgery  . Obesity, unspecified 04/24/2009  . Perforated appendicitis with necrosis s/p open appendectomy 06/07/14 06/04/2014  . Renal cyst    Characterized by MRI as simple  . Ruptured suppurative appendicitis    2015     . SLEEP APNEA, OBSTRUCTIVE 03/22/2009   compliant with CPAP  . THROMBOCYTOPENIA 08/16/2010  . TOBACCO USE, QUIT 10/24/2009  . 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 Umbilical hernia     Family History  Problem Relation Age of Onset  . Thyroid disease Mother   . Ovarian cancer Mother   . Breast cancer Mother   . Lung cancer Father   . Cancer Father     Social History   Social History  . Marital status: Married    Spouse name: N/A  . Number of children: N/A  . Years of education: N/A   Social History Main Topics  . Smoking status: Former Smoker    Packs/day: 2.00    Years: 42.00    Types: Cigarettes    Quit date: 12/30/2008  . Smokeless tobacco: Never Used     Comment: started at age 65; 1-2 ppd; quit 2010  . Alcohol use No     Comment: rarely; maybe 1 beer a year  . Drug use: No  . Sexual activity: Yes   Other Topics Concern  . None   Social History Narrative   Retired paramedic   Regular exercise-yes not recently    Has children   Wife is overweight and has fibromyalgia and depression on disability doesn't go out much. Back surgery    Has older dog   Retired from [employer] 31 years of service now 7 years     Outpatient Encounter Prescriptions as of 06/27/2017  Medication Sig  . acetaminophen (TYLENOL) 325 MG tablet Take 650 mg by mouth every 6 (six) hours as needed for mild pain.  [name] albuterol (PROVENTIL HFA;VENTOLIN HFA) 108 (90 Base) MCG/ACT inhaler Inhale 2 puffs into the lungs every 6 (six) hours as needed for wheezing or shortness of breath.  [name] atorvastatin (LIPITOR) 80 MG tablet TAKE 1 TABLET BY MOUTH  DAILY  . diphenhydrAMINE (BENADRYL) 25 MG tablet Take 25 mg by mouth at bedtime as needed for sleep.  [name] dofetilide (TIKOSYN) 500 MCG capsule TAKE 1 CAPSULE BY MOUTH TWO TIMES DAILY  . famotidine (PEPCID AC) 10 MG chewable tablet Chew 10 mg by mouth daily as needed for heartburn.  . fish oil-omega-3 fatty acids 1000 MG capsule Take 1 g by mouth 2 (two) times daily.    [name] KLOR-CON M20 20 MEQ tablet TAKE 1 TABLET BY MOUTH EVERY DAY  . Magnesium Oxide 500 MG TABS Take 750 mg by mouth daily.  . metFORMIN (GLUCOPHAGE-XR) 500 MG 24 hr tablet TAKE 3 TABLETS BY MOUTH  DAILY WITH BREAKFAST  . MULTIPLE VITAMIN PO Take 1 tablet by mouth every evening.   . nitroGLYCERIN (NITROSTAT) 0.4 MG SL tablet Place 1 tablet (0.4 mg total) under the tongue every 5 (five) minutes as needed for chest pain.  [name]  pantoprazole (PROTONIX) 40 MG tablet TAKE 1 TABLET (40 MG TOTAL) BY MOUTH DAILY.  [name] PRADAXA 150 MG CAPS capsule TAKE 1 CAPSULE BY MOUTH  EVERY 12 HOURS  . Suvorexant (BELSOMRA) 20 MG TABS Take 1 tablet by mouth at bedtime.  . [DISCONTINUED] doxycycline (VIBRA-TABS) 100 MG tablet Take 1 tablet (100 mg total) by mouth 2 (two) times daily.  . [DISCONTINUED] methocarbamol (ROBAXIN) 500 MG tablet Take 2 tablets (1,000 mg total) by mouth every 6 (six) hours as needed for muscle spasms.  . [DISCONTINUED] traMADol (ULTRAM) 50 MG tablet Take 1 tablet (50 mg total) by mouth every 8 (eight) hours as needed for moderate pain.   No facility-administered encounter medications on file as of 06/27/2017.     EXAM:  BP 118/70 (BP Location: Right Arm, Patient Position: Sitting, Cuff Size: Large)   Pulse (!) 59   Temp 98.3 F (36.8 C) (Oral)   Ht 6' 1"  (1.854 m)   Wt (!) 314 lb 3.2 oz (142.5 kg)   BMI 41.45 kg/m   Body mass index is 41.45 kg/m. BP Readings from Last 3 Encounters:  06/27/17 118/70  05/20/17 118/66  03/24/17 (!) 150/70    Physical Exam: Vital signs reviewed [id number] is a well-developed well-nourished alert cooperative   who appears stated age in no acute distress.  HEENT: normocephalic atraumatic , Eyes: PERRL EOM's full, conjunctiva clear, glasses  Nares: paten,t no deformity discharge or tenderness., Ears: no deformity EAC's clear TMs with normal landmarks. Mouth: clear OP, no lesions, edema.  Moist mucous membranes. Dentition in adequate repair. NECK: supple  without masses, thyromegaly or bruits. CHEST/PULM:  Clear to auscultation and percussion breath sounds equal no wheeze , rales or rhonchi. No chest wall deformities or tenderness. CV: PMI is nondisplaced, S1 S2 no gallops, murmurs, rubs. Peripheral pulses are present  without delay.No JVD .  ABDOMEN: Bowel sounds normal nontender  No guard or rebound, no hepato splenomegal no CVA tenderness.   Healed  Scars  Extremtities:  No clubbing cyanosis or edema, no acute joint swelling or redness no focal atrophy NEURO:  Oriented x3, cranial nerves 3-12 appear to be intact, no obvious focal weakness,gait within normal limits no abnormal reflexes or asymmetrical SKIN: No acute rashes normal turgor, color, no bruising or petechiae. PSYCH: Oriented, good eye contact, no obvious depression anxiety, cognition and judgment appear normal. LN: no cervical axillary inguinal adenopathy No noted deficits in memory, attention, and speech. Diabetic Foot Exam - Simple   Simple Foot Form Diabetic Foot exam was performed with the following findings:  Yes 06/27/2017  9:35 AM  Visual Inspection No deformities, no ulcerations, no other skin breakdown bilaterally:  Yes Sensation Testing Intact to touch and monofilament testing bilaterally:  Yes See comments:  Yes Pulse Check Posterior Tibialis and Dorsalis pulse intact bilaterally:  Yes Comments Dec sense left great to no lesion      Lab Results  Component Value Date   WBC 10.2 03/01/2017   HGB 16.1 03/01/2017   HCT 45.8 03/01/2017   PLT 144 (L) 03/01/2017   GLUCOSE 151 (H) 03/01/2017   CHOL 99 01/23/2017   TRIG 183.0 (H) 01/23/2017   HDL 35.90 (L) 01/23/2017   LDLCALC 26 01/23/2017   ALT 48 03/01/2017   AST 28 03/01/2017   NA 136 03/01/2017   K 4.2 03/01/2017   CL 102 03/01/2017   CREATININE 0.76 03/01/2017   BUN 17 03/01/2017   CO2 25 03/01/2017   TSH 2.85 06/18/2016  PSA 0.80 06/18/2016   INR 1.31 12/16/2016   HGBA1C 6.9 (H) 01/23/2017    MICROALBUR 9.6 (H) 06/18/2016   Wt Readings from Last 3 Encounters:  06/27/17 (!) 314 lb 3.2 oz (142.5 kg)  05/20/17 (!) 319 lb (144.7 kg)  03/24/17 (!) 319 lb 12.8 oz (145.1 kg)    ASSESSMENT AND PLAN:  Discussed the following assessment and plan:  Visit for preventive health examination - Plan: CBC with Differential/Platelet, Hemoglobin A1c, Lipid panel, PSA, Magnesium, TSH, Basic metabolic panel, Hepatic function panel  Diabetes mellitus without complication ([place]) - Plan: CBC with Differential/Platelet, Hemoglobin A1c, Lipid panel, Magnesium, TSH, Basic metabolic panel, Hepatic function panel, Microalbumin / creatinine urine ratio  Atherosclerosis - Plan: CBC with Differential/Platelet, Hemoglobin A1c, Lipid panel, Magnesium, TSH, Basic metabolic panel, Hepatic function panel  Thrombocytopenia (HCC) - Plan: CBC with Differential/Platelet, Hemoglobin A1c, Lipid panel, Magnesium, TSH, Basic metabolic panel, Hepatic function panel  Chronic anticoagulation - Plan: CBC with Differential/Platelet, Hemoglobin A1c, Lipid panel, Magnesium, TSH, Basic metabolic panel, Hepatic function panel  Morbid obesity (HCC) - Plan: CBC with Differential/Platelet, Hemoglobin A1c, Lipid panel, Magnesium, TSH, Basic metabolic panel, Hepatic function panel  Mixed hyperlipidemia - Plan: CBC with Differential/Platelet, Hemoglobin A1c, Lipid panel, Magnesium, TSH, Basic metabolic panel, Hepatic function panel  Medication management - Plan: CBC with Differential/Platelet, Hemoglobin A1c, Lipid panel, PSA, Magnesium, TSH, Basic metabolic panel, Hepatic function panel  Type 2 diabetes mellitus with other circulatory complications (HCC) - Plan: CBC with Differential/Platelet, Hemoglobin A1c, Lipid panel, Magnesium, TSH, Basic metabolic panel, Hepatic function panel  Screening PSA (prostate specific antigen) - Plan: PSA Hg a1c  psa  Dis Shared Decision Making  Reviewed  Disease process  medication management    Patient Care Team: Panosh, Standley Brooking, MD as PCP - General Juanita Craver, MD (Gastroenterology) Lelon Perla, MD (Cardiology) Jackolyn Confer, MD as Consulting Physician (General Surgery) Georgeann Oppenheim, MD as Consulting Physician (Ophthalmology) Chesley Mires, MD as Consulting Physician (Pulmonary Disease) Angelia Mould, MD as Consulting Physician (Vascular Surgery)  Patient Instructions  Will notify you  of labs when available. Make sure utd ye check  Weight loss will help your medical conditins.  Avoid  Eating at night .   Decrease portion size .   Look into shingrix vaccine      Preventive Care 65 Years and Older, Male Preventive care refers to lifestyle choices and visits with your health care provider that can promote health and wellness. What does preventive care include?  A yearly physical exam. This is also called an annual well check.  Dental exams once or twice a year.  Routine eye exams. Ask your health care provider how often you should have your eyes checked.  Personal lifestyle choices, including: ? Daily care of your teeth and gums. ? Regular physical activity. ? Eating a healthy diet. ? Avoiding tobacco and drug use. ? Limiting alcohol use. ? Practicing safe sex. ? Taking low doses of aspirin every day. ? Taking vitamin and mineral supplements as recommended by your health care provider. What happens during an annual well check? The services and screenings done by your health care provider during your annual well check will depend on your age, overall health, lifestyle risk factors, and family history of disease. Counseling Your health care provider may ask you questions about your:  Alcohol use.  Tobacco use.  Drug use.  Emotional well-being.  Home and relationship well-being.  Sexual activity.  Eating habits.  History of falls.  Memory  and ability to understand (cognition).  Work and work Statistician.  Screening You may  have the following tests or measurements:  Height, weight, and BMI.  Blood pressure.  Lipid and cholesterol levels. These may be checked every 5 years, or more frequently if you are over 75 years old.  Skin check.  Lung cancer screening. You may have this screening every year starting at age 29 if you have a 30-pack-year history of smoking and currently smoke or have quit within the past 15 years.  Fecal occult blood test (FOBT) of the stool. You may have this test every year starting at age 57.  Flexible sigmoidoscopy or colonoscopy. You may have a sigmoidoscopy every 5 years or a colonoscopy every 10 years starting at age 68.  Prostate cancer screening. Recommendations will vary depending on your family history and other risks.  Hepatitis C blood test.  Hepatitis B blood test.  Sexually transmitted disease (STD) testing.  Diabetes screening. This is done by checking your blood sugar (glucose) after you have not eaten for a while (fasting). You may have this done every 1-3 years.  Abdominal aortic aneurysm (AAA) screening. You may need this if you are a current or former smoker.  Osteoporosis. You may be screened starting at age 73 if you are at high risk.  Talk with your health care provider about your test results, treatment options, and if necessary, the need for more tests. Vaccines Your health care provider may recommend certain vaccines, such as:  Influenza vaccine. This is recommended every year.  Tetanus, diphtheria, and acellular pertussis (Tdap, Td) vaccine. You may need a Td booster every 10 years.  Varicella vaccine. You may need this if you have not been vaccinated.  Zoster vaccine. You may need this after age 104.  Measles, mumps, and rubella (MMR) vaccine. You may need at least one dose of MMR if you were born in 1957 or later. You may also need a second dose.  Pneumococcal 13-valent conjugate (PCV13) vaccine. One dose is recommended after age  60.  Pneumococcal polysaccharide (PPSV23) vaccine. One dose is recommended after age 81.  Meningococcal vaccine. You may need this if you have certain conditions.  Hepatitis A vaccine. You may need this if you have certain conditions or if you travel or work in places where you may be exposed to hepatitis A.  Hepatitis B vaccine. You may need this if you have certain conditions or if you travel or work in places where you may be exposed to hepatitis B.  Haemophilus influenzae type b (Hib) vaccine. You may need this if you have certain risk factors.  Talk to your health care provider about which screenings and vaccines you need and how often you need them. This information is not intended to replace advice given to you by your health care provider. Make sure you discuss any questions you have with your health care provider. Document Released: 01/12/2016 Document Revised: 09/04/2016 Document Reviewed: 10/17/2015 Elsevier Interactive Patient Education  2017 [place] K. Panosh M.D.

## 2017-06-27 ENCOUNTER — Encounter: Payer: Self-pay | Admitting: Internal Medicine

## 2017-06-27 ENCOUNTER — Ambulatory Visit (INDEPENDENT_AMBULATORY_CARE_PROVIDER_SITE_OTHER): Payer: Medicare Other | Admitting: Internal Medicine

## 2017-06-27 VITALS — BP 118/70 | HR 59 | Temp 98.3°F | Ht 73.0 in | Wt 314.2 lb

## 2017-06-27 DIAGNOSIS — Z0000 Encounter for general adult medical examination without abnormal findings: Secondary | ICD-10-CM | POA: Diagnosis not present

## 2017-06-27 DIAGNOSIS — E782 Mixed hyperlipidemia: Secondary | ICD-10-CM | POA: Diagnosis not present

## 2017-06-27 DIAGNOSIS — D696 Thrombocytopenia, unspecified: Secondary | ICD-10-CM

## 2017-06-27 DIAGNOSIS — E1159 Type 2 diabetes mellitus with other circulatory complications: Secondary | ICD-10-CM | POA: Diagnosis not present

## 2017-06-27 DIAGNOSIS — I7090 Unspecified atherosclerosis: Secondary | ICD-10-CM | POA: Diagnosis not present

## 2017-06-27 DIAGNOSIS — Z125 Encounter for screening for malignant neoplasm of prostate: Secondary | ICD-10-CM | POA: Diagnosis not present

## 2017-06-27 DIAGNOSIS — Z7901 Long term (current) use of anticoagulants: Secondary | ICD-10-CM

## 2017-06-27 DIAGNOSIS — E119 Type 2 diabetes mellitus without complications: Secondary | ICD-10-CM

## 2017-06-27 DIAGNOSIS — Z79899 Other long term (current) drug therapy: Secondary | ICD-10-CM

## 2017-06-27 LAB — CBC WITH DIFFERENTIAL/PLATELET
Basophils Absolute: 0 10*3/uL (ref 0.0–0.1)
Basophils Relative: 0.3 % (ref 0.0–3.0)
Eosinophils Absolute: 0.5 10*3/uL (ref 0.0–0.7)
Eosinophils Relative: 7 % — ABNORMAL HIGH (ref 0.0–5.0)
HCT: 47.5 % (ref 39.0–52.0)
Hemoglobin: 16.3 g/dL (ref 13.0–17.0)
Lymphocytes Relative: 26.6 % (ref 12.0–46.0)
Lymphs Abs: 2.1 10*3/uL (ref 0.7–4.0)
MCHC: 34.4 g/dL (ref 30.0–36.0)
MCV: 93.2 fl (ref 78.0–100.0)
Monocytes Absolute: 0.6 10*3/uL (ref 0.1–1.0)
Monocytes Relative: 8.2 % (ref 3.0–12.0)
Neutro Abs: 4.5 10*3/uL (ref 1.4–7.7)
Neutrophils Relative %: 57.9 % (ref 43.0–77.0)
Platelets: 152 10*3/uL (ref 150.0–400.0)
RBC: 5.1 Mil/uL (ref 4.22–5.81)
RDW: 13.9 % (ref 11.5–15.5)
WBC: 7.8 10*3/uL (ref 4.0–10.5)

## 2017-06-27 LAB — HEPATIC FUNCTION PANEL
ALT: 49 U/L (ref 0–53)
AST: 28 U/L (ref 0–37)
Albumin: 4.5 g/dL (ref 3.5–5.2)
Alkaline Phosphatase: 50 U/L (ref 39–117)
Bilirubin, Direct: 0.3 mg/dL (ref 0.0–0.3)
Total Bilirubin: 1.2 mg/dL (ref 0.2–1.2)
Total Protein: 7.3 g/dL (ref 6.0–8.3)

## 2017-06-27 LAB — MICROALBUMIN / CREATININE URINE RATIO
Creatinine,U: 214.4 mg/dL
Microalb Creat Ratio: 7.8 mg/g (ref 0.0–30.0)
Microalb, Ur: 16.7 mg/dL — ABNORMAL HIGH (ref 0.0–1.9)

## 2017-06-27 LAB — HEMOGLOBIN A1C: Hgb A1c MFr Bld: 7.1 % — ABNORMAL HIGH (ref 4.6–6.5)

## 2017-06-27 LAB — LIPID PANEL
Cholesterol: 89 mg/dL (ref 0–200)
HDL: 34.2 mg/dL — ABNORMAL LOW (ref >39.00)
LDL Cholesterol: 33 mg/dL (ref 0–99)
NonHDL: 55.28
Total CHOL/HDL Ratio: 3
Triglycerides: 113 mg/dL (ref 0.0–149.0)
VLDL: 22.6 mg/dL (ref 0.0–40.0)

## 2017-06-27 LAB — BASIC METABOLIC PANEL
BUN: 15 mg/dL (ref 6–23)
CO2: 29 mEq/L (ref 19–32)
Calcium: 9.9 mg/dL (ref 8.4–10.5)
Chloride: 104 mEq/L (ref 96–112)
Creatinine, Ser: 0.85 mg/dL (ref 0.40–1.50)
GFR: 95.38 mL/min (ref >60.00)
Glucose, Bld: 147 mg/dL — ABNORMAL HIGH (ref 70–99)
Potassium: 4.2 mEq/L (ref 3.5–5.1)
Sodium: 140 mEq/L (ref 135–145)

## 2017-06-27 LAB — PSA: PSA: 0.72 ng/mL (ref 0.10–4.00)

## 2017-06-27 LAB — TSH: TSH: 2.05 u[IU]/mL (ref 0.35–4.50)

## 2017-06-27 LAB — MAGNESIUM: Magnesium: 2 mg/dL (ref 1.5–2.5)

## 2017-06-27 NOTE — Patient Instructions (Addendum)
Will notify you  of labs when available. Make sure utd ye check  Weight loss will help your medical conditins.  Avoid  Eating at night .   Decrease portion size .   Look into shingrix vaccine      Preventive Care 68 Years and Older, Male Preventive care refers to lifestyle choices and visits with your health care provider that can promote health and wellness. What does preventive care include?  A yearly physical exam. This is also called an annual well check.  Dental exams once or twice a year.  Routine eye exams. Ask your health care provider how often you should have your eyes checked.  Personal lifestyle choices, including: ? Daily care of your teeth and gums. ? Regular physical activity. ? Eating a healthy diet. ? Avoiding tobacco and drug use. ? Limiting alcohol use. ? Practicing safe sex. ? Taking low doses of aspirin every day. ? Taking vitamin and mineral supplements as recommended by your health care provider. What happens during an annual well check? The services and screenings done by your health care provider during your annual well check will depend on your age, overall health, lifestyle risk factors, and family history of disease. Counseling Your health care provider may ask you questions about your:  Alcohol use.  Tobacco use.  Drug use.  Emotional well-being.  Home and relationship well-being.  Sexual activity.  Eating habits.  History of falls.  Memory and ability to understand (cognition).  Work and work Statistician.  Screening You may have the following tests or measurements:  Height, weight, and BMI.  Blood pressure.  Lipid and cholesterol levels. These may be checked every 5 years, or more frequently if you are over 30 years old.  Skin check.  Lung cancer screening. You may have this screening every year starting at age 32 if you have a 30-pack-year history of smoking and currently smoke or have quit within the past 15  years.  Fecal occult blood test (FOBT) of the stool. You may have this test every year starting at age 33.  Flexible sigmoidoscopy or colonoscopy. You may have a sigmoidoscopy every 5 years or a colonoscopy every 10 years starting at age 62.  Prostate cancer screening. Recommendations will vary depending on your family history and other risks.  Hepatitis C blood test.  Hepatitis B blood test.  Sexually transmitted disease (STD) testing.  Diabetes screening. This is done by checking your blood sugar (glucose) after you have not eaten for a while (fasting). You may have this done every 1-3 years.  Abdominal aortic aneurysm (AAA) screening. You may need this if you are a current or former smoker.  Osteoporosis. You may be screened starting at age 54 if you are at high risk.  Talk with your health care provider about your test results, treatment options, and if necessary, the need for more tests. Vaccines Your health care provider may recommend certain vaccines, such as:  Influenza vaccine. This is recommended every year.  Tetanus, diphtheria, and acellular pertussis (Tdap, Td) vaccine. You may need a Td booster every 10 years.  Varicella vaccine. You may need this if you have not been vaccinated.  Zoster vaccine. You may need this after age 109.  Measles, mumps, and rubella (MMR) vaccine. You may need at least one dose of MMR if you were born in 1957 or later. You may also need a second dose.  Pneumococcal 13-valent conjugate (PCV13) vaccine. One dose is recommended after age 30.  Pneumococcal  polysaccharide (PPSV23) vaccine. One dose is recommended after age 54.  Meningococcal vaccine. You may need this if you have certain conditions.  Hepatitis A vaccine. You may need this if you have certain conditions or if you travel or work in places where you may be exposed to hepatitis A.  Hepatitis B vaccine. You may need this if you have certain conditions or if you travel or work in  places where you may be exposed to hepatitis B.  Haemophilus influenzae type b (Hib) vaccine. You may need this if you have certain risk factors.  Talk to your health care provider about which screenings and vaccines you need and how often you need them. This information is not intended to replace advice given to you by your health care provider. Make sure you discuss any questions you have with your health care provider. Document Released: 01/12/2016 Document Revised: 09/04/2016 Document Reviewed: 10/17/2015 Elsevier Interactive Patient Education  2017 [employer].

## 2017-07-04 ENCOUNTER — Other Ambulatory Visit: Payer: Self-pay | Admitting: Internal Medicine

## 2017-07-04 NOTE — Telephone Encounter (Signed)
Sent to the pharmacy by e-scribe. 

## 2017-07-24 ENCOUNTER — Ambulatory Visit: Payer: Medicare Other

## 2017-08-04 ENCOUNTER — Ambulatory Visit (INDEPENDENT_AMBULATORY_CARE_PROVIDER_SITE_OTHER): Payer: Medicare Other | Admitting: Internal Medicine

## 2017-08-04 ENCOUNTER — Encounter: Payer: Self-pay | Admitting: Internal Medicine

## 2017-08-04 ENCOUNTER — Ambulatory Visit (INDEPENDENT_AMBULATORY_CARE_PROVIDER_SITE_OTHER): Payer: Medicare Other

## 2017-08-04 VITALS — BP 118/68 | HR 78 | Temp 98.3°F | Wt 313.4 lb

## 2017-08-04 DIAGNOSIS — M545 Low back pain: Secondary | ICD-10-CM

## 2017-08-04 DIAGNOSIS — Z9889 Other specified postprocedural states: Secondary | ICD-10-CM | POA: Diagnosis not present

## 2017-08-04 DIAGNOSIS — Z7901 Long term (current) use of anticoagulants: Secondary | ICD-10-CM

## 2017-08-04 DIAGNOSIS — R1031 Right lower quadrant pain: Secondary | ICD-10-CM

## 2017-08-04 DIAGNOSIS — E119 Type 2 diabetes mellitus without complications: Secondary | ICD-10-CM | POA: Diagnosis not present

## 2017-08-04 MED ORDER — TRAMADOL HCL 50 MG PO TABS: 50.0000 mg | ORAL_TABLET | Freq: Three times a day (TID) | ORAL | 0 refills | Status: DC | PRN

## 2017-08-04 MED ORDER — PREDNISONE 10 MG PO TABS: ORAL_TABLET | ORAL | 0 refills | Status: DC

## 2017-08-04 NOTE — Patient Instructions (Addendum)
This may be hip in addition to back .  Get x ray hip.   Back   [facility]  Site    And then we can add  Prednisone .      And pain med as needed.   I fnot responding we may need you to see ortho or NS  depending on how  You are doing.

## 2017-08-04 NOTE — Progress Notes (Signed)
Chief Complaint  Patient presents with  . Hip Pain  . Back Pain    HPI: [name] [name]. 66 y.o.  SDA    onset  August 1-2  Diffuse onset of pain area right hip area and then progressed to increase   3- 6  And yesterday 8-9/10 almost in teasrs pain and hard to sleep from pain   And  Thus came in   9/10 this am . .    No assoc  Activity    No injury and no fever. No    hurts  to walk   Groin some releivfe by sitting .     No fever gu sx    Remote hx of back surgery   And csf leak .    No bleeding falling.  But pain is nauseating at times  ROS: See pertinent positives and negatives per HPI.  Past Medical History:  Diagnosis Date  . Atrial fibrillation ([place]) 03/22/2009   a. s/p multiple DCCV; b. no coumadin due to low TE risk profile; c. Tikosyn Rx  . Coronary atherosclerosis of native coronary artery 11/2002   a. s/p stent to LAD 12/03; OM2 occluded at cath 12/03; d. myoview 5/10: no ischemia;  e. echo 7/11: EF 55%, BAE, mild RVE, PASP 41-45; Myoview was in March 2013. There was no ischemia or infarction, EF 51%   . Cutaneous abscess of back excluding buttocks 07/04/2014   Appears to stem from possibly a cyst very large area 6 cm contact surgeon office   . Diabetes mellitus without complication ([place])   . Drusen body    see opth note  . ERECTILE DYSFUNCTION 03/22/2009  . GERD 03/22/2009  . HYPERGLYCEMIA 04/25/2010  . HYPERLIPIDEMIA 03/22/2009  . 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 LATERAL EPICONDYLITIS, LEFT 10/24/2009  . LIVER FUNCTION TESTS, ABNORMAL 04/25/2010  . Local reaction to immunization 05/05/2012   minor resolving  zostavax   . Myocardial infarction ([place]) mi2003  . Numbness in left leg    foot related to back disease and surgery  . Obesity, unspecified 04/24/2009  . Perforated appendicitis with necrosis s/p open appendectomy 06/07/14 06/04/2014  . Renal cyst    Characterized by MRI as simple  . Ruptured suppurative appendicitis    2015   .  SLEEP APNEA, OBSTRUCTIVE 03/22/2009   compliant with CPAP  . THROMBOCYTOPENIA 08/16/2010  . TOBACCO USE, QUIT 10/24/2009  . 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 Umbilical hernia     Family History  Problem Relation Age of Onset  . Thyroid disease Mother   . Ovarian cancer Mother   . Breast cancer Mother   . Lung cancer Father   . Cancer Father     Social History   Social History  . Marital status: Married    Spouse name: N/A  . Number of children: N/A  . Years of education: N/A   Social History Main Topics  . Smoking status: Former Smoker    Packs/day: 2.00    Years: 42.00    Types: Cigarettes    Quit date: 12/30/2008  . Smokeless tobacco: Never Used     Comment: started at age 13; 1-2 ppd; quit 2010  . Alcohol use No     Comment: rarely; maybe 1 beer a year  . Drug use: No  . Sexual activity: Yes   Other Topics Concern  . None   Social History Narrative  Retired paramedic   Regular exercise-yes not recently    Has children   Wife is overweight and has fibromyalgia and depression on disability doesn't go out much. Back surgery    Has older dog   Retired from [employer] 31 years of service now 7 years     Outpatient Medications Prior to Visit  Medication Sig Dispense Refill  . acetaminophen (TYLENOL) 325 MG tablet Take 650 mg by mouth every 6 (six) hours as needed for mild pain.    [name] albuterol (PROVENTIL HFA;VENTOLIN HFA) 108 (90 Base) MCG/ACT inhaler Inhale 2 puffs into the lungs every 6 (six) hours as needed for wheezing or shortness of breath. 1 Inhaler 0  . atorvastatin (LIPITOR) 80 MG tablet TAKE 1 TABLET BY MOUTH  DAILY 90 tablet 0  . diphenhydrAMINE (BENADRYL) 25 MG tablet Take 25 mg by mouth at bedtime as needed for sleep.    [name] dofetilide (TIKOSYN) 500 MCG capsule TAKE 1 CAPSULE BY MOUTH TWO TIMES DAILY 180 capsule 1  . famotidine (PEPCID AC) 10 MG chewable tablet Chew 10 mg by mouth daily as needed for heartburn.    . fish oil-omega-3 fatty acids 1000 MG  capsule Take 1 g by mouth 2 (two) times daily.     [name] KLOR-CON M20 20 MEQ tablet TAKE 1 TABLET BY MOUTH EVERY DAY 90 tablet 2  . Magnesium Oxide 500 MG TABS Take 750 mg by mouth daily.    . metFORMIN (GLUCOPHAGE-XR) 500 MG 24 hr tablet TAKE 3 TABLETS BY MOUTH  DAILY WITH BREAKFAST 270 tablet 0  . MULTIPLE VITAMIN PO Take 1 tablet by mouth every evening.     . nitroGLYCERIN (NITROSTAT) 0.4 MG SL tablet Place 1 tablet (0.4 mg total) under the tongue every 5 (five) minutes as needed for chest pain. 25 tablet 12  . ONETOUCH VERIO test strip USE TWICE A DAY 50 each 12  . PRADAXA 150 MG CAPS capsule TAKE 1 CAPSULE BY MOUTH  EVERY 12 HOURS 180 capsule 1  . Suvorexant (BELSOMRA) 20 MG TABS Take 1 tablet by mouth at bedtime. 90 tablet 1  . pantoprazole (PROTONIX) 40 MG tablet TAKE 1 TABLET (40 MG TOTAL) BY MOUTH DAILY. (Patient not taking: Reported on 08/04/2017) 90 tablet 1   No facility-administered medications prior to visit.      EXAM:  BP 118/68 (BP Location: Right Arm, Patient Position: Sitting, Cuff Size: Large)   Pulse 78   Temp 98.3 F (36.8 C) (Oral)   Wt (!) 313 lb 6.4 oz (142.2 kg)   BMI 41.35 kg/m   Body mass index is 41.35 kg/m.  GENERAL: vitals reviewed and listed above, alert, oriented, appears well hydrated and in  Mild acute distress prefers to sit sideways HEENT: atraumatic, conjunctiva  clear, no obvious abnormalities on inspection of external nose and ears  NECK: no obvious masses on inspection palpation  No midline back pain to touch  Right si buttocks pain   Neg slr?    Right  Hip ext rotation pain asymmetrical but not acute  No active rash  MS: moves all extremities without noticeable focal  Abnormality limps a bit hurts to walk  Toe heel  Walk ok. PSYCH: pleasant and cooperative, no obvious depression or anxiety  ASSESSMENT AND PLAN:  Discussed the following assessment and plan:  Right groin pain - Plan: DG HIP UNILAT W OR W/O PELVIS 2-3 VIEWS RIGHT, DG Lumbar Spine  2-3 Views  Acute right-sided low back pain, with sciatica presence  unspecified - Plan: DG HIP UNILAT W OR W/O PELVIS 2-3 VIEWS RIGHT, DG Lumbar Spine 2-3 Views  History of back surgery - Plan: DG HIP UNILAT W OR W/O PELVIS 2-3 VIEWS RIGHT, DG Lumbar Spine 2-3 Views  Chronic anticoagulation  Diabetes mellitus without complication ([place]) Options discussed. At this point we'll give prednisone taper tramadol as needed x-ray  Hip  If  Ongoing  Or progression  Will plan on  Referral  Ns and or ortho.  Caution   bg may risk with steroids  Cant take nsaids  With anticoaguation -Patient advised to return or notify health care team  if symptoms worsen ,persist or new concerns arise.  Patient Instructions  This may be hip in addition to back .  Get x ray hip.   Back   [place]  Site    And then we can add  Prednisone .      And pain med as needed.   I fnot responding we may need you to see ortho or NS  depending on how  You are doing.         Standley Brooking. Panosh M.D. X ray severe djd spine  Mild OA hips bilateral no acute changes

## 2017-08-08 ENCOUNTER — Ambulatory Visit (INDEPENDENT_AMBULATORY_CARE_PROVIDER_SITE_OTHER): Payer: Medicare Other

## 2017-08-08 ENCOUNTER — Other Ambulatory Visit: Payer: Self-pay | Admitting: Internal Medicine

## 2017-08-08 VITALS — BP 150/80 | Ht 73.0 in | Wt 318.0 lb

## 2017-08-08 DIAGNOSIS — Z0000 Encounter for general adult medical examination without abnormal findings: Secondary | ICD-10-CM | POA: Diagnosis not present

## 2017-08-08 NOTE — Progress Notes (Addendum)
Subjective:   Tou Hayner. is a 66 y.o. male who presents for Medicare Annual/Subsequent preventive examination.  The Patient was informed that the wellness visit is to identify future health risk and educate and initiate measures that can reduce risk for increased disease through the lifespan.    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 Today his pain has moved further down his leg to his right knee.   Was able to come off his PPI We'll continue to leave on his medications list to take when necessary  Just dx x 2015 DM   DM eye exam 09/2016/ plans to have this year  Smoker with quit date 12/2008 Has had Abd CT 07/2016  Colonoscopy 12/2016 PSA 0.72  VS reviewed;   Diet (off PPI) Has one cup of decaf in the am (add baking soda to it)  Breakfast every day Sometimes skips Dinner; full meal;  Wife is disabled; She can do some things   BMI 42  Lost  weight; was 350; Trying to get down below  300 so he can get an ablation Watching his carbs; portion size   Exercise stopped now  Was walking 1 to 2 miles qod Counting steps as he does a lot of errands   Dental - goes q 3 months; 6 months for exam   Stressors: back pain   Sleep patterns: cpap at hs  Belsomra helping as sleep)   Pain; today 1-10 / pain is 66 yo 7    Cardiac Risk Factors Addressed Hyperlipidemia - ratio 3; cho 89; HDL 34; Trig 113 Diabetes A1c 7.1; bs 147   Advanced Directives  Patient Care Team: Panosh, Standley Brooking, MD as PCP - General Juanita Craver, MD (Gastroenterology) Lelon Perla, MD (Cardiology) Jackolyn Confer, MD as Consulting Physician (General Surgery) Georgeann Oppenheim, MD as Consulting Physician  (Ophthalmology) Chesley Mires, MD as Consulting Physician (Pulmonary Disease) Angelia Mould, MD as Consulting Physician (Vascular Surgery) Assessed for additional providers  Immunization History  Administered Date(s) Administered  . Influenza Split 10/09/2011, 10/05/2012  . Influenza Whole 10/24/2009, 10/25/2010  . Influenza, High Dose Seasonal PF 08/31/2015, 09/30/2016  . Influenza,inj,Quad PF,36+ Mos 10/26/2013, 11/08/2014  . Pneumococcal Conjugate-13 05/09/2015  . Pneumococcal Polysaccharide-23 04/24/2009, 06/25/2016  . Td 03/22/2009  . Tdap 04/06/2015  . Zoster 04/29/2012   Required Immunizations needed today  Screening test up to date or reviewed for plan of completion Health Maintenance Due  Topic Date Due  . INFLUENZA VACCINE  07/30/2017    Flu vaccine is not available      Cardiac Risk Factors include: advanced age (>33men, >94 women);dyslipidemia;family history of premature cardiovascular disease;hypertension;male gender;obesity (BMI >30kg/m2)     Objective:    Vitals: BP (!) 150/80   Ht 6\' 1"  (1.854 m)   Wt (!) 318 lb (144.2 kg)   BMI 41.96 kg/m   Body mass index is 41.96 kg/m.  Tobacco History  Smoking Status  . Former Smoker  . Packs/day: 2.00  . Years: 42.00  . Types: Cigarettes  . Quit date: 12/30/2008  Smokeless Tobacco  . Never Used    Comment: started at age 9; 1-2 ppd; quit 2010     Counseling given: Not  Answered   Past Medical History:  Diagnosis Date  . Atrial fibrillation ([place]) 03/22/2009   a. s/p multiple DCCV; b. no coumadin due to low TE risk profile; c. Tikosyn Rx  . Coronary atherosclerosis of native coronary artery 11/2002   a. s/p stent to LAD 12/03; OM2 occluded at cath 12/03; d. myoview 5/10: no ischemia;  e. echo 7/11: EF 55%, BAE, mild RVE, PASP 41-45; Myoview was in March 2013. There was no ischemia or infarction, EF 51%   . Cutaneous abscess of back excluding buttocks 07/04/2014   Appears to stem from possibly a  cyst very large area 6 cm contact surgeon office   . Diabetes mellitus without complication ([place])   . Drusen body    see opth note  . ERECTILE DYSFUNCTION 03/22/2009  . GERD 03/22/2009  . HYPERGLYCEMIA 04/25/2010  . HYPERLIPIDEMIA 03/22/2009  . 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 LATERAL EPICONDYLITIS, LEFT 10/24/2009  . LIVER FUNCTION TESTS, ABNORMAL 04/25/2010  . Local reaction to immunization 05/05/2012   minor resolving  zostavax   . Myocardial infarction ([place]) mi2003  . Numbness in left leg    foot related to back disease and surgery  . Obesity, unspecified 04/24/2009  . Perforated appendicitis with necrosis s/p open appendectomy 06/07/14 06/04/2014  . Renal cyst    Characterized by MRI as simple  . Ruptured suppurative appendicitis    2015   . SLEEP APNEA, OBSTRUCTIVE 03/22/2009   compliant with CPAP  . THROMBOCYTOPENIA 08/16/2010  . TOBACCO USE, QUIT 10/24/2009  . 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 Umbilical hernia    Past Surgical History:  Procedure Laterality Date  . APPENDECTOMY N/A 06/06/2014   Procedure: APPENDECTOMY;  Surgeon: Odis Hollingshead, MD;  Location: [facility];  Service: General;  Laterality: N/A;  . COLON SURGERY    . COLONOSCOPY  11/15/2011   Procedure: COLONOSCOPY;  Surgeon: Juanita Craver, MD;  Location: [facility];  Service: Endoscopy;  Laterality: N/A;  . COLONOSCOPY WITH PROPOFOL N/A 01/03/2017   Procedure: COLONOSCOPY WITH PROPOFOL;  Surgeon: Carol Ada, MD;  Location: [facility];  Service: Endoscopy;  Laterality: N/A;  . cyst on epiglottis  08/2002  . ESOPHAGOGASTRODUODENOSCOPY  11/15/2011   Procedure: ESOPHAGOGASTRODUODENOSCOPY (EGD);  Surgeon: Juanita Craver, MD;  Location: [facility];  Service: Endoscopy;  Laterality: N/A;  . LAPAROSCOPIC APPENDECTOMY N/A 06/06/2014   Procedure: APPENDECTOMY LAPAROSCOPIC attemted;  Surgeon: Odis Hollingshead, MD;  Location: [facility];  Service: General;  Laterality: N/A;  . LUMBAR DISC  SURGERY     two  holes in spinalcovering  . stent     LAD DUS 2004  . surgery l4-l5   1998   ruptured x 3  . ulnar neuropathy    . UMBILICAL HERNIA REPAIR     mesh   Family History  Problem Relation Age of Onset  . Thyroid disease Mother   . Ovarian cancer Mother   . Breast cancer Mother   . Lung cancer Father   . Cancer Father    History  Sexual Activity  . Sexual activity: Yes    Outpatient Encounter Prescriptions as of 08/08/2017  Medication Sig  . acetaminophen (TYLENOL) 325 MG tablet Take 650 mg by mouth every 6 (six) hours as needed for mild pain.  [name] albuterol (PROVENTIL HFA;VENTOLIN HFA) 108 (90 Base) MCG/ACT inhaler Inhale 2 puffs into the lungs every 6 (six) hours as needed for wheezing or shortness of breath.  [name]  atorvastatin (LIPITOR) 80 MG tablet TAKE 1 TABLET BY MOUTH  DAILY  . diphenhydrAMINE (BENADRYL) 25 MG tablet Take 25 mg by mouth at bedtime as needed for sleep.  [name] dofetilide (TIKOSYN) 500 MCG capsule TAKE 1 CAPSULE BY MOUTH TWO TIMES DAILY  . famotidine (PEPCID AC) 10 MG chewable tablet Chew 10 mg by mouth daily as needed for heartburn.  . fish oil-omega-3 fatty acids 1000 MG capsule Take 1 g by mouth 2 (two) times daily.   [name] KLOR-CON M20 20 MEQ tablet TAKE 1 TABLET BY MOUTH EVERY DAY  . Magnesium Oxide 500 MG TABS Take 750 mg by mouth daily.  . metFORMIN (GLUCOPHAGE-XR) 500 MG 24 hr tablet TAKE 3 TABLETS BY MOUTH  DAILY WITH BREAKFAST  . MULTIPLE VITAMIN PO Take 1 tablet by mouth every evening.   . nitroGLYCERIN (NITROSTAT) 0.4 MG SL tablet Place 1 tablet (0.4 mg total) under the tongue every 5 (five) minutes as needed for chest pain.  [name] VERIO test strip USE TWICE A DAY  . pantoprazole (PROTONIX) 40 MG tablet TAKE 1 TABLET (40 MG TOTAL) BY MOUTH DAILY.  [name] PRADAXA 150 MG CAPS capsule TAKE 1 CAPSULE BY MOUTH  EVERY 12 HOURS  . predniSONE (DELTASONE) 10 MG tablet Take pills per day,6,6,6,4,4,4,2,2,2,1,1,1  . Suvorexant (BELSOMRA) 20 MG TABS Take 1  tablet by mouth at bedtime.  . traMADol (ULTRAM) 50 MG tablet Take 1 tablet (50 mg total) by mouth every 8 (eight) hours as needed for moderate pain or severe pain.   No facility-administered encounter medications on file as of 08/08/2017.     Activities of Daily Living In your present state of health, do you have any difficulty performing the following activities: 08/08/2017  Hearing? Y  Comment may get hearing aids  Vision? N  Difficulty concentrating or making decisions? N  Walking or climbing stairs? N  Dressing or bathing? N  Doing errands, shopping? N  Preparing Food and eating ? N  Using the Toilet? N  In the past six months, have you accidently leaked urine? N  Do you have problems with loss of bowel control? N  Managing your Medications? N  Managing your Finances? N  Housekeeping or managing your Housekeeping? N  Some recent data might be hidden    Patient Care Team: Panosh, Standley Brooking, MD as PCP - General Juanita Craver, MD (Gastroenterology) Stanford Breed Denice Bors, MD (Cardiology) Jackolyn Confer, MD as Consulting Physician (General Surgery) Georgeann Oppenheim, MD as Consulting Physician (Ophthalmology) Chesley Mires, MD as Consulting Physician (Pulmonary Disease) Angelia Mould, MD as Consulting Physician (Vascular Surgery)   Assessment:     Exercise Activities and Dietary recommendations Current Exercise Habits: Home exercise routine, Type of exercise: walking, Time (Minutes): 35, Frequency (Times/Week): 3, Weekly Exercise (Minutes/Week): 105, Intensity: Moderate  Goals    . Weight (lb) < 200 lb (90.7 kg)          Fat free or low fat dairy products Fish high in omega-3 acids ( salmon, tuna, trout) Fruits, such as apples, bananas, oranges, pears, prunes Legumes, such as kidney beans, lentils, checkpeas, black-eyed peas and lima beans Vegetables; broccoli, cabbage, carrots Whole grains;   Plant fats are better; decrease "white" foods as pasta, rice, bread and  desserts, sugar; Avoid red meat (limiting) palm and coconut oils; sugary foods and beverages  Two nutrients that raise blood chol levels are saturated fats and trans fat; in hydrogenated oils and fats, as stick margarine, baked goods (cookes, cakes, pies,  crackers; frosting; and coffee creamers;   Some Fats lower cholesterol: Monounsaturated and polyunsaturated  Avocados Corn, sunflower, and soybean oils Nuts and seeds, such as walnuts Olive, canola, peanut, safflower, and sesame oils Peanut butter Salmon and trout Tofu         Fall Risk Fall Risk  08/08/2017 06/27/2017 09/30/2016 06/25/2016 05/09/2015  Falls in the past year? No No Yes No Yes  Comment - - - - ttripped over hose in yard   Number falls in past yr: - - 1 - 1  Comment - - fell on grass  - -  Injury with Fall? - - No - Yes  Comment - - - - minor knee pain   Follow up - - - - Falls evaluation completed   Depression Screen PHQ 2/9 Scores 08/08/2017 06/27/2017 06/25/2016 05/09/2015  PHQ - 2 Score 0 0 0 0    Cognitive Function Ad8 score reviewed for issues:  Issues making decisions:  Less interest in hobbies / activities:  Repeats questions, stories (family complaining):  Trouble using ordinary gadgets (microwave, computer, phone):  Forgets the month or year:   Mismanaging finances:   Remembering appts:  Daily problems with thinking and/or memory: Ad8 score is=0          Immunization History  Administered Date(s) Administered  . Influenza Split 10/09/2011, 10/05/2012  . Influenza Whole 10/24/2009, 10/25/2010  . Influenza, High Dose Seasonal PF 08/31/2015, 09/30/2016  . Influenza,inj,Quad PF,36+ Mos 10/26/2013, 11/08/2014  . Pneumococcal Conjugate-13 05/09/2015  . Pneumococcal Polysaccharide-23 04/24/2009, 06/25/2016  . Td 03/22/2009  . Tdap 04/06/2015  . Zoster 04/29/2012   Screening Tests Health Maintenance  Topic Date Due  . INFLUENZA VACCINE  07/30/2017  . OPHTHALMOLOGY EXAM  10/02/2017  .  HEMOGLOBIN A1C  12/27/2017  . FOOT EXAM  06/27/2018  . URINE MICROALBUMIN  06/27/2018  . TETANUS/TDAP  04/05/2025  . COLONOSCOPY  01/03/2027  . Hepatitis C Screening  Completed  . PNA vac Low Risk Adult  Completed      Plan:     PCP Notes   Health Maintenance This patient has over a 30-pack-year history and quit date is around 8 years ago. Educated on referral to pulmonary for low-dose CT screening. States he is seeing pulmonary and will follow-up with his pulmonary doctor.   Has already had a CT of the abdomen to check aorta and other  States he is hard of hearing. May consider getting hearing aids and giving referral to the division of hard of hearing if he chooses to contact them. States [employer] offers a  hearing aid.  Educated regarding the shingrix.  Abnormal Screens  Blood pressure was elevated moderately that was in pain this afternoon.  Referrals  No referrals needed.  Patient concerns; Patient stated he would contact Dr. Regis Bill if he wanted to pursue the MRI for possible back issues. Will continue on the prednisone taper for now  Nurse Concerns; As noted  Next PCP apt We'll schedule with Dr. Regis Bill as needed.      I have personally reviewed and noted the following in the patient's chart:   . Medical and social history . Use of alcohol, tobacco or illicit drugs  . Current medications and supplements . Functional ability and status . Nutritional status . Physical activity . Advanced directives . List of other physicians . Hospitalizations, surgeries, and ER visits in previous 12 months . Vitals . Screenings to include cognitive, depression, and falls . Referrals and appointments  In addition,  I have reviewed and discussed with patient certain preventive protocols, quality metrics, and best practice recommendations. A written personalized care plan for preventive services as well as general preventive health recommendations were provided to patient.      FQHKU,VJDYN, RN  08/08/2017  Above noted reviewed and agree. Lottie Dawson, MD

## 2017-08-08 NOTE — Patient Instructions (Addendum)
[name] , Thank you for taking time to come for your Medicare Wellness Visit. I appreciate your ongoing commitment to your health goals. Please review the following plan we discussed and let me know if I can assist you in the future.   With the your smoking hx, the [place] Task Force as recommended a LDCT of your chest. This would involve a referral to Pulmonary for consult Eric Form)    Deaf & Hard of Hearing Division Services - can assist with hearing aid x 1  No reviews  [address] Office  [address]  [phone]  Shingrix is a vaccine for the prevention of Shingles in Adults 50 and older.  If you are on Medicare, you can request a prescription from your doctor to be filled at a pharmacy.  Please check with your benefits regarding applicable copays or out of pocket expenses.  The Shingrix is given in 2 vaccines approx 8 weeks apart. You must receive the 2nd dose prior to 6 months from receipt of the first.      These are the goals we discussed: Goals    . Weight (lb) < 200 lb (90.7 kg)          Fat free or low fat dairy products Fish high in omega-3 acids ( salmon, tuna, trout) Fruits, such as apples, bananas, oranges, pears, prunes Legumes, such as kidney beans, lentils, checkpeas, black-eyed peas and lima beans Vegetables; broccoli, cabbage, carrots Whole grains;   Plant fats are better; decrease "white" foods as pasta, rice, bread and desserts, sugar; Avoid red meat (limiting) palm and coconut oils; sugary foods and beverages  Two nutrients that raise blood chol levels are saturated fats and trans fat; in hydrogenated oils and fats, as stick margarine, baked goods (cookes, cakes, pies, crackers; frosting; and coffee creamers;   Some Fats lower cholesterol: Monounsaturated and polyunsaturated  Avocados Corn, sunflower, and soybean oils Nuts and seeds, such as walnuts Olive, canola, peanut, safflower, and sesame oils Peanut butter Salmon and trout  Tofu          This is a list of the screening recommended for you and due dates:  Health Maintenance  Topic Date Due  . Flu Shot  07/30/2017  . Eye exam for diabetics  10/02/2017  . Hemoglobin A1C  12/27/2017  . Complete foot exam   06/27/2018  . Urine Protein Check  06/27/2018  . Tetanus Vaccine  04/05/2025  . Colon Cancer Screening  01/03/2027  .  Hepatitis C: One time screening is recommended by Center for Disease Control  (CDC) for  adults born from [birth year] through 1965.   Completed  . Pneumonia vaccines  Completed   Prevention of falls: Remove rugs or any tripping hazards in the home Use Non slip mats in bathtubs and showers Placing grab bars next to the toilet and or shower Placing handrails on both sides of the stair way Adding extra lighting in the home.   Personal safety issues reviewed:  1. Consider starting a community watch program per [facility] 2.  Changes batteries is smoke detector and/or carbon monoxide detector  3.  If you have firearms; keep them in a safe place 4.  Wear protection when in the sun; Always wear sunscreen or a hat; It is good to have your doctor check your skin annually or review any new areas of concern 5. Driving safety; Keep in the right lane; stay 3 car lengths behind the car in  front of you on the highway; look 3 times prior to pulling out; carry your cell phone everywhere you go!    Learn about the Yellow Dot program:  The program allows first responders at your emergency to have access to who your physician is, as well as your medications and medical conditions.  Citizens requesting the Yellow Dot Packages should contact Master Corporal Nunzio Cobbs at the [facility] [phone] for the first week of the program and beginning the week after Easter citizens should contact their Scientist, physiological.   ** Health Maintenance, Male A healthy lifestyle and preventive care is important for your  health and wellness. Ask your health care provider about what schedule of regular examinations is right for you. What should I know about weight and diet? Eat a Healthy Diet  Eat plenty of vegetables, fruits, whole grains, low-fat dairy products, and lean protein.  Do not eat a lot of foods high in solid fats, added sugars, or salt.  Maintain a Healthy Weight Regular exercise can help you achieve or maintain a healthy weight. You should:  Do at least 150 minutes of exercise each week. The exercise should increase your heart rate and make you sweat (moderate-intensity exercise).  Do strength-training exercises at least twice a week.  Watch Your Levels of Cholesterol and Blood Lipids  Have your blood tested for lipids and cholesterol every 5 years starting at 68 years of age. If you are at high risk for heart disease, you should start having your blood tested when you are 68 years old. You may need to have your cholesterol levels checked more often if: ? Your lipid or cholesterol levels are high. ? You are older than 68 years of age. ? You are at high risk for heart disease.  What should I know about cancer screening? Many types of cancers can be detected early and may often be prevented. Lung Cancer  You should be screened every year for lung cancer if: ? You are a current smoker who has smoked for at least 30 years. ? You are a former smoker who has quit within the past 15 years.  Talk to your health care provider about your screening options, when you should start screening, and how often you should be screened.  Colorectal Cancer  Routine colorectal cancer screening usually begins at 68 years of age and should be repeated every 5-10 years until you are 68 years old. You may need to be screened more often if early forms of precancerous polyps or small growths are found. Your health care provider may recommend screening at an earlier age if you have risk factors for colon  cancer.  Your health care provider may recommend using home test kits to check for hidden blood in the stool.  A small camera at the end of a tube can be used to examine your colon (sigmoidoscopy or colonoscopy). This checks for the earliest forms of colorectal cancer.  Prostate and Testicular Cancer  Depending on your age and overall health, your health care provider may do certain tests to screen for prostate and testicular cancer.  Talk to your health care provider about any symptoms or concerns you have about testicular or prostate cancer.  Skin Cancer  Check your skin from head to toe regularly.  Tell your health care provider about any new moles or changes in moles, especially if: ? There is a change in a mole's size, shape, or color. ? You have a  mole that is larger than a pencil eraser.  Always use sunscreen. Apply sunscreen liberally and repeat throughout the day.  Protect yourself by wearing long sleeves, pants, a wide-brimmed hat, and sunglasses when outside.  What should I know about heart disease, diabetes, and high blood pressure?  If you are 64-73 years of age, have your blood pressure checked every 3-5 years. If you are 75 years of age or older, have your blood pressure checked every year. You should have your blood pressure measured twice-once when you are at a hospital or clinic, and once when you are not at a hospital or clinic. Record the average of the two measurements. To check your blood pressure when you are not at a hospital or clinic, you can use: ? An automated blood pressure machine at a pharmacy. ? A home blood pressure monitor.  Talk to your health care provider about your target blood pressure.  If you are between 65-66 years old, ask your health care provider if you should take aspirin to prevent heart disease.  Have regular diabetes screenings by checking your fasting blood sugar level. ? If you are at a normal weight and have a low risk for  diabetes, have this test once every three years after the age of 37. ? If you are overweight and have a high risk for diabetes, consider being tested at a younger age or more often.  A one-time screening for abdominal aortic aneurysm (AAA) by ultrasound is recommended for men aged 40-75 years who are current or former smokers. What should I know about preventing infection? Hepatitis B If you have a higher risk for hepatitis B, you should be screened for this virus. Talk with your health care provider to find out if you are at risk for hepatitis B infection. Hepatitis C Blood testing is recommended for:  Everyone born from [birth year] through 1967.  Anyone with known risk factors for hepatitis C.  Sexually Transmitted Diseases (STDs)  You should be screened each year for STDs including gonorrhea and chlamydia if: ? You are sexually active and are younger than 68 years of age. ? You are older than 68 years of age and your health care provider tells you that you are at risk for this type of infection. ? Your sexual activity has changed since you were last screened and you are at an increased risk for chlamydia or gonorrhea. Ask your health care provider if you are at risk.  Talk with your health care provider about whether you are at high risk of being infected with HIV. Your health care provider may recommend a prescription medicine to help prevent HIV infection.  What else can I do?  Schedule regular health, dental, and eye exams.  Stay current with your vaccines (immunizations).  Do not use any tobacco products, such as cigarettes, chewing tobacco, and e-cigarettes. If you need help quitting, ask your health care provider.  Limit alcohol intake to no more than 2 drinks per day. One drink equals 12 ounces of beer, 5 ounces of wine, or 1 ounces of hard liquor.  Do not use street drugs.  Do not share needles.  Ask your health care provider for help if you need support or information about  quitting drugs.  Tell your health care provider if you often feel depressed.  Tell your health care provider if you have ever been abused or do not feel safe at home. This information is not intended to replace advice given to you by  your health care provider. Make sure you discuss any questions you have with your health care provider. Document Released: 06/13/2008 Document Revised: 08/14/2016 Document Reviewed: 09/19/2015 Elsevier Interactive Patient Education  [employer].

## 2017-08-12 ENCOUNTER — Ambulatory Visit (INDEPENDENT_AMBULATORY_CARE_PROVIDER_SITE_OTHER): Payer: Medicare Other | Admitting: Adult Health

## 2017-08-12 ENCOUNTER — Encounter: Payer: Self-pay | Admitting: Adult Health

## 2017-08-12 VITALS — BP 160/78 | Temp 98.0°F | Ht 73.0 in | Wt 307.0 lb

## 2017-08-12 DIAGNOSIS — M5441 Lumbago with sciatica, right side: Secondary | ICD-10-CM

## 2017-08-12 DIAGNOSIS — M25551 Pain in right hip: Secondary | ICD-10-CM

## 2017-08-12 MED ORDER — CYCLOBENZAPRINE HCL 10 MG PO TABS: 10.0000 mg | ORAL_TABLET | Freq: Three times a day (TID) | ORAL | 0 refills | Status: DC | PRN

## 2017-08-12 MED ORDER — DIAZEPAM 10 MG PO TABS: ORAL_TABLET | ORAL | 0 refills | Status: DC

## 2017-08-12 MED ORDER — PREDNISONE 50 MG PO TABS: ORAL_TABLET | ORAL | 0 refills | Status: DC

## 2017-08-12 NOTE — Progress Notes (Signed)
Subjective:    Patient ID: [name] Chimes., male    DOB: 10-28-1951, 66 y.o.   MRN: [id number]  HPI  66 year old male who  has a past medical history of Atrial fibrillation ([place]) (03/22/2009); Coronary atherosclerosis of native coronary artery (11/2002); Cutaneous abscess of back excluding buttocks (07/04/2014); Diabetes mellitus without complication ([place]); Drusen body; ERECTILE DYSFUNCTION (03/22/2009); GERD (03/22/2009); HYPERGLYCEMIA (04/25/2010); HYPERLIPIDEMIA (03/22/2009); Iliac aneurysm ([place]); LATERAL EPICONDYLITIS, LEFT (10/24/2009); LIVER FUNCTION TESTS, ABNORMAL (04/25/2010); Local reaction to immunization (05/05/2012); Myocardial infarction ([place]) (mi2003); Numbness in left leg; Obesity, unspecified (04/24/2009); Perforated appendicitis with necrosis s/p open appendectomy 06/07/14 (06/04/2014); Renal cyst; Ruptured suppurative appendicitis; SLEEP APNEA, OBSTRUCTIVE (03/22/2009); THROMBOCYTOPENIA (08/16/2010); TOBACCO USE, QUIT (10/24/2009); ULNAR NEUROPATHY, LEFT (04/07/8118); and Umbilical hernia.   He is a patient of Dr. Regis Bill who I am seeing today for an acute issue of right hip pain. He was seen by his PCP last week at which times prednisone and tramadol was prescribed. X ray of hip showed    IMPRESSION: 1. No acute findings. 2. Mild bilateral hip osteoarthritis. 3. Lumbar degenerative disc disease.  And X ray of lumbar spine showed   IMPRESSION: Marked multilevel spondylosis. No acute abnormality. Atherosclerosis. Gallstone.  Today in the office he reports that when he was on high dose prednisone his pain was manageable at a level of 3/10. Since the taper has gone down his pain has increased. He feels as though the Tramadol is not helping at all. Today his pain is 8-9/10.   Reports that the pain starts in his lower back and radiates down the outside of his right leg and into the right groin.   He has history of previous back surgeries and herniated disks.   Denies any trauma, or  issue with bowel or bladder.   Reports that sitting will help with some of the pain but that he is finding it hard to sleep at night due to pain   Review of Systems See HPI   Past Medical History:  Diagnosis Date  . Atrial fibrillation ([place]) 03/22/2009   a. s/p multiple DCCV; b. no coumadin due to low TE risk profile; c. Tikosyn Rx  . Coronary atherosclerosis of native coronary artery 11/2002   a. s/p stent to LAD 12/03; OM2 occluded at cath 12/03; d. myoview 5/10: no ischemia;  e. echo 7/11: EF 55%, BAE, mild RVE, PASP 41-45; Myoview was in March 2013. There was no ischemia or infarction, EF 51%   . Cutaneous abscess of back excluding buttocks 07/04/2014   Appears to stem from possibly a cyst very large area 6 cm contact surgeon office   . Diabetes mellitus without complication ([place])   . Drusen body    see opth note  . ERECTILE DYSFUNCTION 03/22/2009  . GERD 03/22/2009  . HYPERGLYCEMIA 04/25/2010  . HYPERLIPIDEMIA 03/22/2009  . 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 LATERAL EPICONDYLITIS, LEFT 10/24/2009  . LIVER FUNCTION TESTS, ABNORMAL 04/25/2010  . Local reaction to immunization 05/05/2012   minor resolving  zostavax   . Myocardial infarction ([place]) mi2003  . Numbness in left leg    foot related to back disease and surgery  . Obesity, unspecified 04/24/2009  . Perforated appendicitis with necrosis s/p open appendectomy 06/07/14 06/04/2014  . Renal cyst    Characterized by MRI as simple  . Ruptured suppurative appendicitis    2015   . SLEEP APNEA, OBSTRUCTIVE 03/22/2009  compliant with CPAP  . THROMBOCYTOPENIA 08/16/2010  . TOBACCO USE, QUIT 10/24/2009  . 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 Umbilical hernia     Social History   Social History  . Marital status: Married    Spouse name: N/A  . Number of children: N/A  . Years of education: N/A   Occupational History  . Not on file.   Social History Main Topics  . Smoking status: Former Smoker      Packs/day: 2.00    Years: 42.00    Types: Cigarettes    Quit date: 12/30/2008  . Smokeless tobacco: Never Used     Comment: started at age 42; 1-2 ppd; quit 2010  . Alcohol use No     Comment: rarely; maybe 1 beer a year  . Drug use: No  . Sexual activity: Yes   Other Topics Concern  . Not on file   Social History Narrative   Retired paramedic   Regular exercise-yes not recently    Has children   Wife is overweight and has fibromyalgia and depression on disability doesn't go out much. Back surgery    Has older dog   Retired from [employer] 31 years of service now 7 years     Past Surgical History:  Procedure Laterality Date  . APPENDECTOMY N/A 06/06/2014   Procedure: APPENDECTOMY;  Surgeon: Odis Hollingshead, MD;  Location: [facility];  Service: General;  Laterality: N/A;  . COLON SURGERY    . COLONOSCOPY  11/15/2011   Procedure: COLONOSCOPY;  Surgeon: Juanita Craver, MD;  Location: [facility];  Service: Endoscopy;  Laterality: N/A;  . COLONOSCOPY WITH PROPOFOL N/A 01/03/2017   Procedure: COLONOSCOPY WITH PROPOFOL;  Surgeon: Carol Ada, MD;  Location: [facility];  Service: Endoscopy;  Laterality: N/A;  . cyst on epiglottis  08/2002  . ESOPHAGOGASTRODUODENOSCOPY  11/15/2011   Procedure: ESOPHAGOGASTRODUODENOSCOPY (EGD);  Surgeon: Juanita Craver, MD;  Location: [facility];  Service: Endoscopy;  Laterality: N/A;  . LAPAROSCOPIC APPENDECTOMY N/A 06/06/2014   Procedure: APPENDECTOMY LAPAROSCOPIC attemted;  Surgeon: Odis Hollingshead, MD;  Location: [facility];  Service: General;  Laterality: N/A;  . LUMBAR DISC SURGERY     two  holes in spinalcovering  . stent     LAD DUS 2004  . surgery l4-l5   1998   ruptured x 3  . ulnar neuropathy    . UMBILICAL HERNIA REPAIR     mesh    Family History  Problem Relation Age of Onset  . Thyroid disease Mother   . Ovarian cancer Mother   . Breast cancer Mother   . Lung cancer Father   . Cancer Father     Allergies  Allergen Reactions  .  Penicillins     Childhood reaction-details unknown  . Procaine Hcl Hives and Other (See Comments)    Red in the face and neck.  First generation in 1950s, but after that, I have been able to tolerate novacaine and lidocaine.  . Pseudoephedrine Other (See Comments)    Patient went into afib  . Sulfonamide Derivatives     Childhood reaction   . Cardizem [Diltiazem] Rash    Current Outpatient Prescriptions on File Prior to Visit  Medication Sig Dispense Refill  . acetaminophen (TYLENOL) 325 MG tablet Take 650 mg by mouth every 6 (six) hours as needed for mild pain.    [name] albuterol (PROVENTIL HFA;VENTOLIN HFA) 108 (90 Base) MCG/ACT inhaler Inhale 2 puffs into the lungs every 6 (six) hours as  needed for wheezing or shortness of breath. 1 Inhaler 0  . atorvastatin (LIPITOR) 80 MG tablet TAKE 1 TABLET BY MOUTH  DAILY 90 tablet 0  . diphenhydrAMINE (BENADRYL) 25 MG tablet Take 25 mg by mouth at bedtime as needed for sleep.    [name] dofetilide (TIKOSYN) 500 MCG capsule TAKE 1 CAPSULE BY MOUTH TWO TIMES DAILY 180 capsule 1  . famotidine (PEPCID AC) 10 MG chewable tablet Chew 10 mg by mouth daily as needed for heartburn.    . fish oil-omega-3 fatty acids 1000 MG capsule Take 1 g by mouth 2 (two) times daily.     [name] KLOR-CON M20 20 MEQ tablet TAKE 1 TABLET BY MOUTH EVERY DAY 90 tablet 2  . Magnesium Oxide 500 MG TABS Take 750 mg by mouth daily.    . metFORMIN (GLUCOPHAGE-XR) 500 MG 24 hr tablet TAKE 3 TABLETS BY MOUTH  DAILY WITH BREAKFAST 270 tablet 0  . MULTIPLE VITAMIN PO Take 1 tablet by mouth every evening.     . nitroGLYCERIN (NITROSTAT) 0.4 MG SL tablet Place 1 tablet (0.4 mg total) under the tongue every 5 (five) minutes as needed for chest pain. 25 tablet 12  . ONETOUCH VERIO test strip USE TWICE A DAY 50 each 12  . pantoprazole (PROTONIX) 40 MG tablet TAKE 1 TABLET (40 MG TOTAL) BY MOUTH DAILY. 90 tablet 1  . PRADAXA 150 MG CAPS capsule TAKE 1 CAPSULE BY MOUTH  EVERY 12 HOURS 180 capsule 1  .  Suvorexant (BELSOMRA) 20 MG TABS Take 1 tablet by mouth at bedtime. 90 tablet 1  . traMADol (ULTRAM) 50 MG tablet Take 1 tablet (50 mg total) by mouth every 8 (eight) hours as needed for moderate pain or severe pain. 21 tablet 0   No current facility-administered medications on file prior to visit.     BP (!) 160/78 (BP Location: Left Arm)   Temp 98 F (36.7 C) (Oral)   Ht 6\' 1"  (1.854 m)   Wt (!) 307 lb (139.3 kg)   BMI 40.50 kg/m       Objective:   Physical Exam  Constitutional: He is oriented to person, place, and time. He appears well-developed and well-nourished. No distress.  Appears in pain   Cardiovascular: Normal rate, regular rhythm, normal heart sounds and intact distal pulses.  Exam reveals no gallop and no friction rub.   No murmur heard. Pulmonary/Chest: Effort normal.  Musculoskeletal: He exhibits tenderness. He exhibits no edema or deformity.  Walks with limping gait   Neurological: He is alert and oriented to person, place, and time.  Skin: Skin is warm and dry. No rash noted. He is not diaphoretic. No erythema. No pallor.  Psychiatric: He has a normal mood and affect. His behavior is normal. Judgment and thought content normal.  Nursing note and vitals reviewed.      Assessment & Plan:  Will prescribe Flexeril 10 mg QHS and Prednisone 50 mg daily x 5 days. Advised to drinkplenty of water to help keep blood sugars down. Will get MRI of right hip and lumbar spine and refer to orthopedics  - Follow up with PCP as needed  Dorothyann Peng, NP

## 2017-08-14 ENCOUNTER — Other Ambulatory Visit: Payer: Self-pay | Admitting: Internal Medicine

## 2017-08-15 ENCOUNTER — Encounter: Payer: Self-pay | Admitting: Family

## 2017-08-19 ENCOUNTER — Encounter: Payer: Self-pay | Admitting: Internal Medicine

## 2017-08-19 ENCOUNTER — Ambulatory Visit (INDEPENDENT_AMBULATORY_CARE_PROVIDER_SITE_OTHER): Payer: Medicare Other | Admitting: Internal Medicine

## 2017-08-19 VITALS — BP 120/60 | HR 63 | Temp 98.3°F | Wt 303.4 lb

## 2017-08-19 DIAGNOSIS — Z9889 Other specified postprocedural states: Secondary | ICD-10-CM | POA: Diagnosis not present

## 2017-08-19 DIAGNOSIS — M5441 Lumbago with sciatica, right side: Secondary | ICD-10-CM

## 2017-08-19 DIAGNOSIS — M25551 Pain in right hip: Secondary | ICD-10-CM | POA: Diagnosis not present

## 2017-08-19 MED ORDER — PREDNISONE 10 MG PO TABS: ORAL_TABLET | ORAL | 1 refills | Status: DC

## 2017-08-19 MED ORDER — METHOCARBAMOL 500 MG PO TABS: 500.0000 mg | ORAL_TABLET | Freq: Four times a day (QID) | ORAL | 0 refills | Status: DC | PRN

## 2017-08-19 MED ORDER — TRAMADOL HCL 50 MG PO TABS: 50.0000 mg | ORAL_TABLET | Freq: Three times a day (TID) | ORAL | 0 refills | Status: DC | PRN

## 2017-08-19 NOTE — Patient Instructions (Addendum)
Proceed with mri of hip and back.    MRI  .Marland KitchenMarland Kitchen  Taper of pred and can try tramadol  Higher dose    If needed.

## 2017-08-19 NOTE — Progress Notes (Signed)
Chief Complaint  Patient presents with  . Tailbone Pain    HPI: [name] [name]. 66 y.o.  Recurring   50 mg helped .    Last Tuesday .   Today .  Muscle relaxer   ? Help robaxin helped in past   5/6    Feels weak in  Leg  Right  Pain worse with walking  Than sitting but hard to get comfortable  Prednisone helped but inc pain after dose down to 20 mg   Or so  Needs  Help to get through to   Mri and plan   [name] [name]     With prefer  Ortho    ROS: See pertinent positives and negatives per HPI.  Past Medical History:  Diagnosis Date  . Atrial fibrillation ([place]) 03/22/2009   a. s/p multiple DCCV; b. no coumadin due to low TE risk profile; c. Tikosyn Rx  . Coronary atherosclerosis of native coronary artery 11/2002   a. s/p stent to LAD 12/03; OM2 occluded at cath 12/03; d. myoview 5/10: no ischemia;  e. echo 7/11: EF 55%, BAE, mild RVE, PASP 41-45; Myoview was in March 2013. There was no ischemia or infarction, EF 51%   . Cutaneous abscess of back excluding buttocks 07/04/2014   Appears to stem from possibly a cyst very large area 6 cm contact surgeon office   . Diabetes mellitus without complication ([place])   . Drusen body    see opth note  . ERECTILE DYSFUNCTION 03/22/2009  . GERD 03/22/2009  . HYPERGLYCEMIA 04/25/2010  . HYPERLIPIDEMIA 03/22/2009  . 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 LATERAL EPICONDYLITIS, LEFT 10/24/2009  . LIVER FUNCTION TESTS, ABNORMAL 04/25/2010  . Local reaction to immunization 05/05/2012   minor resolving  zostavax   . Myocardial infarction ([place]) mi2003  . Numbness in left leg    foot related to back disease and surgery  . Obesity, unspecified 04/24/2009  . Perforated appendicitis with necrosis s/p open appendectomy 06/07/14 06/04/2014  . Renal cyst    Characterized by MRI as simple  . Ruptured suppurative appendicitis    2015   . SLEEP APNEA, OBSTRUCTIVE 03/22/2009   compliant with CPAP  . THROMBOCYTOPENIA 08/16/2010  . TOBACCO  USE, QUIT 10/24/2009  . 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 Umbilical hernia     Family History  Problem Relation Age of Onset  . Thyroid disease Mother   . Ovarian cancer Mother   . Breast cancer Mother   . Lung cancer Father   . Cancer Father     Social History   Social History  . Marital status: Married    Spouse name: N/A  . Number of children: N/A  . Years of education: N/A   Social History Main Topics  . Smoking status: Former Smoker    Packs/day: 2.00    Years: 42.00    Types: Cigarettes    Quit date: 12/30/2008  . Smokeless tobacco: Never Used     Comment: started at age 53; 1-2 ppd; quit 2010  . Alcohol use No     Comment: rarely; maybe 1 beer a year  . Drug use: No  . Sexual activity: Yes   Other Topics Concern  . None   Social History Narrative   Retired paramedic   Regular exercise-yes not recently    Has children   Wife is overweight and has fibromyalgia and depression on disability doesn't go  out much. Back surgery    Has older dog   Retired from [employer] 31 years of service now 7 years     Outpatient Medications Prior to Visit  Medication Sig Dispense Refill  . acetaminophen (TYLENOL) 325 MG tablet Take 650 mg by mouth every 6 (six) hours as needed for mild pain.    [name] albuterol (PROVENTIL HFA;VENTOLIN HFA) 108 (90 Base) MCG/ACT inhaler Inhale 2 puffs into the lungs every 6 (six) hours as needed for wheezing or shortness of breath. 1 Inhaler 0  . atorvastatin (LIPITOR) 80 MG tablet TAKE 1 TABLET BY MOUTH  DAILY 90 tablet 1  . cyclobenzaprine (FLEXERIL) 10 MG tablet Take 1 tablet (10 mg total) by mouth 3 (three) times daily as needed for muscle spasms. 30 tablet 0  . diazepam (VALIUM) 10 MG tablet Take one pill 30 minutes before MRI 1 tablet 0  . diphenhydrAMINE (BENADRYL) 25 MG tablet Take 25 mg by mouth at bedtime as needed for sleep.    [name] dofetilide (TIKOSYN) 500 MCG capsule TAKE 1 CAPSULE BY MOUTH TWO TIMES DAILY 180 capsule 1  . famotidine (PEPCID  AC) 10 MG chewable tablet Chew 10 mg by mouth daily as needed for heartburn.    . fish oil-omega-3 fatty acids 1000 MG capsule Take 1 g by mouth 2 (two) times daily.     [name] KLOR-CON M20 20 MEQ tablet TAKE 1 TABLET BY MOUTH EVERY DAY 90 tablet 2  . Magnesium Oxide 500 MG TABS Take 750 mg by mouth daily.    . metFORMIN (GLUCOPHAGE-XR) 500 MG 24 hr tablet TAKE 3 TABLETS BY MOUTH  DAILY WITH BREAKFAST 270 tablet 0  . MULTIPLE VITAMIN PO Take 1 tablet by mouth every evening.     . nitroGLYCERIN (NITROSTAT) 0.4 MG SL tablet Place 1 tablet (0.4 mg total) under the tongue every 5 (five) minutes as needed for chest pain. 25 tablet 12  . ONETOUCH VERIO test strip USE TWICE A DAY 50 each 12  . pantoprazole (PROTONIX) 40 MG tablet TAKE 1 TABLET (40 MG TOTAL) BY MOUTH DAILY. 90 tablet 1  . PRADAXA 150 MG CAPS capsule TAKE 1 CAPSULE BY MOUTH  EVERY 12 HOURS 180 capsule 1  . Suvorexant (BELSOMRA) 20 MG TABS Take 1 tablet by mouth at bedtime. 90 tablet 1  . predniSONE (DELTASONE) 50 MG tablet Take one pill daily 5 tablet 0  . traMADol (ULTRAM) 50 MG tablet Take 1 tablet (50 mg total) by mouth every 8 (eight) hours as needed for moderate pain or severe pain. 21 tablet 0   No facility-administered medications prior to visit.      EXAM:  BP 120/60 (BP Location: Right Arm, Patient Position: Sitting, Cuff Size: Large)   Pulse 63   Temp 98.3 F (36.8 C) (Oral)   Wt (!) 303 lb 6.4 oz (137.6 kg)   BMI 40.03 kg/m   Body mass index is 40.03 kg/m.  GENERAL: vitals reviewed and listed above, alert, oriented, appears well hydrated and in some pain  distress walks with limp favoring right leg    No leg drop  HEENT: atraumatic, conjunctiva  clear, no obvious abnormalities on inspection of external nose and ears  MS: moves all extremities  Limping  Pain described as buttock to knee and lateral numbness no foot drop  PSYCH: pleasant and cooperative, no obvious depression or anxiety  ASSESSMENT AND  PLAN:  Discussed the following assessment and plan:  Acute right-sided low back pain with right-sided  sciatica  Right hip pain  History of back surgery Taper pred  And add pain med  tolerated  Can try robaxin  Instead of  flexerill . Keep appt for MRI and then appropriate referral .  Risk benefit of medication discussed.  -Patient advised to return or notify health care team  if symptoms worsen ,persist or new concerns arise.  Patient Instructions  Proceed with mri of hip and back.    MRI  .Marland KitchenMarland Kitchen  Taper of pred and can try tramadol  Higher dose    If needed.      Standley Brooking. Panosh M.D.

## 2017-08-20 ENCOUNTER — Ambulatory Visit: Payer: Medicare Other | Admitting: Family

## 2017-08-20 ENCOUNTER — Encounter (HOSPITAL_COMMUNITY): Payer: Medicare Other

## 2017-08-23 ENCOUNTER — Ambulatory Visit
Admission: RE | Admit: 2017-08-23 | Discharge: 2017-08-23 | Disposition: A | Discharge status: Home / Self Care | Payer: Medicare Other | Source: Ambulatory Visit | Attending: Adult Health | Admitting: Adult Health

## 2017-08-23 DIAGNOSIS — M25551 Pain in right hip: Secondary | ICD-10-CM

## 2017-08-27 ENCOUNTER — Ambulatory Visit (INDEPENDENT_AMBULATORY_CARE_PROVIDER_SITE_OTHER): Payer: Medicare Other | Admitting: Family

## 2017-08-27 ENCOUNTER — Telehealth: Payer: Self-pay | Admitting: Emergency Medicine

## 2017-08-27 ENCOUNTER — Telehealth: Payer: Self-pay | Admitting: Internal Medicine

## 2017-08-27 ENCOUNTER — Ambulatory Visit (HOSPITAL_COMMUNITY)
Admission: RE | Admit: 2017-08-27 | Discharge: 2017-08-27 | Disposition: A | Discharge status: Home / Self Care | Payer: Medicare Other | Source: Ambulatory Visit | Attending: Family | Admitting: Family

## 2017-08-27 ENCOUNTER — Encounter: Payer: Self-pay | Admitting: Internal Medicine

## 2017-08-27 ENCOUNTER — Encounter: Payer: Self-pay | Admitting: Family

## 2017-08-27 VITALS — BP 136/79 | HR 63 | Temp 97.2°F | Resp 17 | Ht 73.0 in | Wt 308.0 lb

## 2017-08-27 DIAGNOSIS — Z87891 Personal history of nicotine dependence: Secondary | ICD-10-CM

## 2017-08-27 DIAGNOSIS — I723 Aneurysm of iliac artery: Secondary | ICD-10-CM

## 2017-08-27 NOTE — Telephone Encounter (Signed)
Spoke with patient regarding MRI results. Patient states that he would like Dr. Regis Bill to take a look at the report and give feed back on what he should worry about and what he shouldn't . Patient does have an appointment next with ortho. Please advise.

## 2017-08-27 NOTE — Patient Instructions (Signed)
Abdominal Aortic Aneurysm Blood pumps away from the heart through tubes (blood vessels) called arteries. Aneurysms are weak or damaged places in the wall of an artery. It bulges out like a balloon. An abdominal aortic aneurysm happens in the main artery of the body (aorta). It can burst or tear, causing bleeding inside the body. This is an emergency. It needs treatment right away. What are the causes? The exact cause is unknown. Things that could cause this problem include:  Fat and other substances building up in the lining of a tube.  Swelling of the walls of a blood vessel.  Certain tissue diseases.  Belly (abdominal) trauma.  An infection in the main artery of the body.  What increases the risk? There are things that make it more likely for you to have an aneurysm. These include:  Being over the age of 68 years old.  Having high blood pressure (hypertension).  Being a male.  Being white.  Being very overweight (obese).  Having a family history of aneurysm.  Using tobacco products.  What are the signs or symptoms? Symptoms depend on the size of the aneurysm and how fast it grows. There may not be symptoms. If symptoms occur, they can include:  Pain (belly, side, lower back, or groin).  Feeling full after eating a small amount of food.  Feeling sick to your stomach (nauseous), throwing up (vomiting), or both.  Feeling a lump in your belly that feels like it is beating (pulsating).  Feeling like you will pass out (faint).  How is this treated?  Medicine to control blood pressure and pain.  Imaging tests to see if the aneurysm gets bigger.  Surgery. How is this prevented? To lessen your chance of getting this condition:  Stop smoking. Stop chewing tobacco.  Limit or avoid alcohol.  Keep your blood pressure, blood sugar, and cholesterol within normal limits.  Eat less salt.  Eat foods low in saturated fats and cholesterol. These are found in animal and  whole dairy products.  Eat more fiber. Fiber is found in whole grains, vegetables, and fruits.  Keep a healthy weight.  Stay active and exercise often.  This information is not intended to replace advice given to you by your health care provider. Make sure you discuss any questions you have with your health care provider. Document Released: 04/12/2013 Document Revised: 05/23/2016 Document Reviewed: 01/15/2013 Elsevier Interactive Patient Education  2017 Elsevier Inc.  

## 2017-08-27 NOTE — Telephone Encounter (Signed)
[name] [name] pt returned your call

## 2017-08-27 NOTE — Telephone Encounter (Signed)
No alarming  Findings but   Certainly   Back and hip  Problems may both be causing his  Pain .   Keep appt with dr [name]   Consider also seeing   NS depending on  How he is doing and  Dr Trevor Mace opinion

## 2017-08-27 NOTE — Telephone Encounter (Signed)
Left VM for patient to give the office a call back regarding results.

## 2017-08-27 NOTE — Progress Notes (Signed)
[facility] [place]   CC: Follow up Right Common Iliac Artery Aneurysm  History of Present Illness  [name] [name]. is a 66 y.o. ([birth year]) male patient of Dr. Scot Dock who had appendicitis about 2015  with rupture and underwent surgery for this. He did have an open wound that required wet to dry dressings at that time. This has healed. 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 He has not had any abdominal pain or back pain. He does chronic back pain as he has had multiple back surgeries in the past. There is no family hx of aneurysms.   He denies any claudication type symptoms, but states he does have some numbness on the ball of his left foot and to his 2nd toe and states this is from his back issues. He has low back, right hip, right groin, radiating to right knee pain for about a month, tramadol and a muscle relaxer helps. Recent MRI of both found acetabular tear. He is taking prednisone since early August 2018, which has also helped this pain.    He is a retired Audiological scientist.    He also has a history of PAF with palpitations.He has PAF since 2005 and has undergone cardioversion x 9. He is on tikosyn and Pradaxa for this. He states if he can get his weight below 300 pounds, Dr. Joylene Grapes will consider a cardiac ablation.   He does have hx of renal cyst on the right kidney that was followed with ultrasound.   He does have a hx of diabetes and he does take Metformin for this. He is on a statin for cholesterol management. He does not take any medication for hypertension as he has normal blood pressure.   The patient denies claudication in legs with walking. The patient denies any history of stroke or TIA symptoms.  He had bronchitis in February and March 2018.    Pt Diabetic: Yes, 6.9 A1C per pt Pt smoker: former smoker, quit in 2010, smoked 40 years   Past Medical History:  Diagnosis Date  . Atrial  fibrillation ([place]) 03/22/2009   a. s/p multiple DCCV; b. no coumadin due to low TE risk profile; c. Tikosyn Rx  . Coronary atherosclerosis of native coronary artery 11/2002   a. s/p stent to LAD 12/03; OM2 occluded at cath 12/03; d. myoview 5/10: no ischemia;  e. echo 7/11: EF 55%, BAE, mild RVE, PASP 41-45; Myoview was in March 2013. There was no ischemia or infarction, EF 51%   . Cutaneous abscess of back excluding buttocks 07/04/2014   Appears to stem from possibly a cyst very large area 6 cm contact surgeon office   . Diabetes mellitus without complication ([place])   . Drusen body    see opth note  . ERECTILE DYSFUNCTION 03/22/2009  . GERD 03/22/2009  . HYPERGLYCEMIA 04/25/2010  . HYPERLIPIDEMIA 03/22/2009  . 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 LATERAL EPICONDYLITIS, LEFT 10/24/2009  . LIVER FUNCTION TESTS, ABNORMAL 04/25/2010  . Local reaction to immunization 05/05/2012   minor resolving  zostavax   . Myocardial infarction ([place]) mi2003  . Numbness in left leg    foot related to back disease and surgery  . Obesity, unspecified 04/24/2009  . Perforated appendicitis with necrosis s/p open appendectomy 06/07/14 06/04/2014  . Renal cyst    Characterized by MRI as simple  . Ruptured suppurative appendicitis  2015   . SLEEP APNEA, OBSTRUCTIVE 03/22/2009   compliant with CPAP  . THROMBOCYTOPENIA 08/16/2010  . TOBACCO USE, QUIT 10/24/2009  . 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 Umbilical hernia    Past Surgical History:  Procedure Laterality Date  . APPENDECTOMY N/A 06/06/2014   Procedure: APPENDECTOMY;  Surgeon: Odis Hollingshead, MD;  Location: [facility];  Service: General;  Laterality: N/A;  . COLON SURGERY    . COLONOSCOPY  11/15/2011   Procedure: COLONOSCOPY;  Surgeon: Juanita Craver, MD;  Location: [facility];  Service: Endoscopy;  Laterality: N/A;  . COLONOSCOPY WITH PROPOFOL N/A 01/03/2017   Procedure: COLONOSCOPY WITH PROPOFOL;  Surgeon: Carol Ada, MD;   Location: [facility];  Service: Endoscopy;  Laterality: N/A;  . cyst on epiglottis  08/2002  . ESOPHAGOGASTRODUODENOSCOPY  11/15/2011   Procedure: ESOPHAGOGASTRODUODENOSCOPY (EGD);  Surgeon: Juanita Craver, MD;  Location: [facility];  Service: Endoscopy;  Laterality: N/A;  . LAPAROSCOPIC APPENDECTOMY N/A 06/06/2014   Procedure: APPENDECTOMY LAPAROSCOPIC attemted;  Surgeon: Odis Hollingshead, MD;  Location: [facility];  Service: General;  Laterality: N/A;  . LUMBAR DISC SURGERY     two  holes in spinalcovering  . stent     LAD DUS 2004  . surgery l4-l5   1998   ruptured x 3  . ulnar neuropathy    . UMBILICAL HERNIA REPAIR     mesh   Social History Social History   Social History  . Marital status: Married    Spouse name: N/A  . Number of children: N/A  . Years of education: N/A   Occupational History  . Not on file.   Social History Main Topics  . Smoking status: Former Smoker    Packs/day: 2.00    Years: 42.00    Types: Cigarettes    Quit date: 12/30/2008  . Smokeless tobacco: Never Used     Comment: started at age 45; 1-2 ppd; quit 2010  . Alcohol use No     Comment: rarely; maybe 1 beer a year  . Drug use: No  . Sexual activity: Yes   Other Topics Concern  . Not on file   Social History Narrative   Retired paramedic   Regular exercise-yes not recently    Has children   Wife is overweight and has fibromyalgia and depression on disability doesn't go out much. Back surgery    Has older dog   Retired from [employer] 31 years of service now 7 years    Family History Family History  Problem Relation Age of Onset  . Thyroid disease Mother   . Ovarian cancer Mother   . Breast cancer Mother   . Lung cancer Father   . Cancer Father     Current Outpatient Prescriptions on File Prior to Visit  Medication Sig Dispense Refill  . acetaminophen (TYLENOL) 325 MG tablet Take 650 mg by mouth every 6 (six) hours as needed for mild pain.    [name] albuterol (PROVENTIL HFA;VENTOLIN HFA) 108  (90 Base) MCG/ACT inhaler Inhale 2 puffs into the lungs every 6 (six) hours as needed for wheezing or shortness of breath. 1 Inhaler 0  . atorvastatin (LIPITOR) 80 MG tablet TAKE 1 TABLET BY MOUTH  DAILY 90 tablet 1  . cyclobenzaprine (FLEXERIL) 10 MG tablet Take 1 tablet (10 mg total) by mouth 3 (three) times daily as needed for muscle spasms. 30 tablet 0  . diphenhydrAMINE (BENADRYL) 25 MG tablet Take 25 mg by mouth at bedtime as needed for  sleep.    . dofetilide (TIKOSYN) 500 MCG capsule TAKE 1 CAPSULE BY MOUTH TWO TIMES DAILY 180 capsule 1  . famotidine (PEPCID AC) 10 MG chewable tablet Chew 10 mg by mouth daily as needed for heartburn.    . fish oil-omega-3 fatty acids 1000 MG capsule Take 1 g by mouth 2 (two) times daily.     [name] KLOR-CON M20 20 MEQ tablet TAKE 1 TABLET BY MOUTH EVERY DAY 90 tablet 2  . Magnesium Oxide 500 MG TABS Take 750 mg by mouth daily.    . metFORMIN (GLUCOPHAGE-XR) 500 MG 24 hr tablet TAKE 3 TABLETS BY MOUTH  DAILY WITH BREAKFAST 270 tablet 0  . methocarbamol (ROBAXIN) 500 MG tablet Take 1-2 tablets (500-1,000 mg total) by mouth every 6 (six) hours as needed for muscle spasms. 40 tablet 0  . MULTIPLE VITAMIN PO Take 1 tablet by mouth every evening.     . nitroGLYCERIN (NITROSTAT) 0.4 MG SL tablet Place 1 tablet (0.4 mg total) under the tongue every 5 (five) minutes as needed for chest pain. 25 tablet 12  . ONETOUCH VERIO test strip USE TWICE A DAY 50 each 12  . PRADAXA 150 MG CAPS capsule TAKE 1 CAPSULE BY MOUTH  EVERY 12 HOURS 180 capsule 1  . predniSONE (DELTASONE) 10 MG tablet Take pills per day,,4,x 4 days  3 per day  x 4 days , 2  Per day for 4 days 1 per day for 4 days 40 tablet 1  . Suvorexant (BELSOMRA) 20 MG TABS Take 1 tablet by mouth at bedtime. 90 tablet 1  . traMADol (ULTRAM) 50 MG tablet Take 1-2 tablets (50-100 mg total) by mouth every 8 (eight) hours as needed for moderate pain or severe pain. 40 tablet 0  . diazepam (VALIUM) 10 MG tablet Take one pill 30  minutes before MRI (Patient not taking: Reported on 08/27/2017) 1 tablet 0  . pantoprazole (PROTONIX) 40 MG tablet TAKE 1 TABLET (40 MG TOTAL) BY MOUTH DAILY. (Patient not taking: Reported on 08/27/2017) 90 tablet 1   No current facility-administered medications on file prior to visit.    Allergies  Allergen Reactions  . Penicillins     Childhood reaction-details unknown  . Procaine Hcl Hives and Other (See Comments)    Red in the face and neck.  First generation in 1950s, but after that, I have been able to tolerate novacaine and lidocaine.  . Pseudoephedrine Other (See Comments)    Patient went into afib  . Sulfonamide Derivatives     Childhood reaction   . Cardizem [Diltiazem] Rash    ROS: See HPI for pertinent positives and negatives.  Physical Examination  Vitals:   08/27/17 1036  BP: 136/79  Pulse: 63  Resp: 17  Temp: (!) 97.2 F (36.2 C)  TempSrc: Oral  SpO2: 93%  Weight: (!) 308 lb (139.7 kg)  Height: 6\' 1"  (1.854 m)   Body mass index is 40.64 kg/m.  General: A&O x 3, WD, morbidly obese male.  Pulmonary: Sym exp, respirations are non labored, distant breath sounds, CTAB Cardiac: RRR, Nl S1, S2, no detected murmur.   Carotid Bruits Right Left   Negative Negative   Aorta is not palpable Radial pulses are 2+ palpable and =                          VASCULAR EXAM:  LE Pulses Right Left       FEMORAL  not palpable (obese)  palpable         POPLITEAL  not palpable   not palpable       POSTERIOR TIBIAL  palpable   palpable        DORSALIS PEDIS      ANTERIOR TIBIAL  palpable   palpable     Gastrointestinal: soft, NTND, -G/R, - HSM, - masses palpated, - CVAT B, large panus.  Musculoskeletal: M/S 5/5 throughout, Extremities without ischemic changes.  Neurologic: CN 2-12 intact, Pain and light touch  intact in extremities are intact except, Motor exam as listed above.    DATA  Bilateral Aortoiliac Duplex (08/27/2017):  Previous size: Right CIA: 2.3 cm by CT (Date: 08-07-16)  Current size:  Right CIA: 2.6 cm (Date: 08-27-17), Left CIA: 1.5 cm  08/07/16 CTA abd/pelvis: Moderate amount of atherosclerotic plaque within a normal caliber abdominal aorta, not resulting in a hemodynamically significant stenosis. Aortic Atherosclerosis (ICD10-170.0) 2. Unchanged mild fusiform ectasia of the right common and internal iliac arteries measuring approximately 2.3 cm and 1.4 cm in diameter respectively, similar to the 05/2014 examination.  Medical Decision Making  The patient is a 66 y.o. male who presents with an asymptomatic right CIA aneurysm with no significant increase in size: 2.3 cm today. 2.1 cm was largest measured diameter on CTA in August 2016. Pt states he has had ultrasounds of his gallbladder in the past, and states there had been no problem identifying what was under evaluation. Pt requests that an abdominal duplex be attempted instead of a CTA, and if that does provide much useful information, then a CTA abd/pelvis will be scheduled.  His blood pressure remains in good control and he quit tobacco use in 2010, with a 40 year history of tobacco use.    Based on this patient's exam and diagnostic studies, the patient will follow up in 1 year  with the following studies: bilateral aortoiliac duplex.  Consideration for repair of a common iliac artery aneurysm would be made when the size is 3.5 cm, and symptomatic status.        The patient was given information about AAA including signs, symptoms, treatment,  and how to minimize the risk of enlargement and rupture of aneurysms.    I emphasized the importance of maximal medical management including strict control of blood pressure, blood glucose, and lipid levels, antiplatelet agents, obtaining regular exercise, and continued cessation  of smoking.   The patient was advised to call 911 should the patient experience sudden onset abdominal or back pain.   Thank you for allowing [place] to participate in this patient's care.  Clemon Chambers, RN, MSN, FNP-C [facility] [place] [facility] Office: [phone]  Clinic Physician: Scot Dock  08/27/2017, 11:11 AM

## 2017-08-28 ENCOUNTER — Other Ambulatory Visit: Payer: Self-pay | Admitting: Internal Medicine

## 2017-08-28 NOTE — Telephone Encounter (Signed)
Spoke with patient regarding Dr. Regis Bill recommendation. Patient understood nothing further needed

## 2017-08-29 MED ORDER — METHOCARBAMOL 500 MG PO TABS: 500.0000 mg | ORAL_TABLET | Freq: Four times a day (QID) | ORAL | 0 refills | Status: DC | PRN

## 2017-08-29 MED ORDER — TRAMADOL HCL 50 MG PO TABS: 50.0000 mg | ORAL_TABLET | Freq: Three times a day (TID) | ORAL | 0 refills | Status: DC | PRN

## 2017-08-29 NOTE — Telephone Encounter (Signed)
Ok to refill each x 1    Tell [name] [name] to have the ortho  rx future    meds as appropriate for his hip and back pain  Thanks  [facility]

## 2017-09-03 NOTE — Addendum Note (Signed)
Addended by: Lianne Cure A on: 09/03/2017 03:30 PM   Modules accepted: Orders

## 2017-09-04 ENCOUNTER — Encounter (INDEPENDENT_AMBULATORY_CARE_PROVIDER_SITE_OTHER): Payer: Self-pay | Admitting: Orthopaedic Surgery

## 2017-09-04 ENCOUNTER — Ambulatory Visit (INDEPENDENT_AMBULATORY_CARE_PROVIDER_SITE_OTHER): Payer: Medicare Other | Admitting: Orthopaedic Surgery

## 2017-09-04 DIAGNOSIS — M25551 Pain in right hip: Secondary | ICD-10-CM | POA: Diagnosis not present

## 2017-09-04 DIAGNOSIS — M5441 Lumbago with sciatica, right side: Secondary | ICD-10-CM

## 2017-09-04 NOTE — Progress Notes (Signed)
Office Visit Note   Patient: [name] [name].           Date of Birth: August 31, 1951           MRN: [id number] Visit Date: 09/04/2017              Requested by: Dorothyann Peng, NP [place], [place] [address] PCP: Burnis Medin, MD   Assessment & Plan: Visit Diagnoses:  1. Acute back pain with sciatica, right   2. Right hip pain     Plan: I do feel that most of his symptoms are related to his lumbar spine and not the hip. I would not recommend any intervention for the hip thus far he understands this fully we will over his MRIs in significant detail both showing him the MRI of the pelvis as well as plain films and the plain films of his lumbar spine and MRI of his lumbar spine. I would like to refer him to my partner Dr. Ernestina Patches to consider epidural steroid injection the right and most likely L5-S1 but he may need to do a level above that as well. The patient agrees with this plan and all questions were encouraged and answered. We will work on making that referral to Dr. Ernestina Patches.  Follow-Up Instructions: Return in about 4 weeks (around 10/02/2017).   Orders:  Orders Placed This Encounter  Procedures  . Ambulatory referral to Physical Medicine Rehab   No orders of the defined types were placed in this encounter.     Procedures: No procedures performed   Clinical Data: No additional findings.   Subjective: Chief Complaint  Patient presents with  . Right Hip - Pain  . Lower Back - Pain  The patient is a very pleasant 66 year old gentleman is sent to me to evaluate right hip pain. He actually has an MRI on canopy system as well as plain films of both his lumbar spine and his right hip and pelvis. He denies a pain in his groin and points mainly to his sciatic areas a source of pain that does radiate to his knee but not past his knee. He does get occasional numbness and tingling as well's is been slowly hurting worse over the last 5 weeks or so. Is mainly low  back pain he says to the right side he says occasionally does get some pain in his groin. He denies any change in bowel or bladder function either. He is been on prednisone already he is a diabetic so does increase his blood glucose. He is also on tramadol Robaxin. The pain is becoming irritating type pain.  HPI  Review of Systems He currently denies any headache, chest pain, shortness of breath, fever, chills, nausea, vomiting.  Objective: Vital Signs: There were no vitals taken for this visit.  Physical Exam He is alert or 3 and in no acute distress. Ortho Exam Examination of his left and right hips show normal range of motion of both hips with no pain in the groin and no blocks to rotation at all. There is no pain with any stressing of either hip especially the right hip. He does seem to have low back pain to the right side with a positive straight leg raise to the right side. He has good strength in his bilateral lower extremities and no weakness or change in reflexes. Specialty Comments:  No specialty comments available.  Imaging: No results found. I independently reviewed both plain films of the pelvis and  lumbar spine as well as MRI of the lumbar spine and his right hip. He does have a significant tear labral cyst that is more posterior and some degenerative labral tearing but no significant arthritic findings of the hip other than some minimal thinning of the cartilage. The MRI of his lumbar spine though does show significant right-sided foraminal stenosis is multifactorial at several levels especially L5-S1 but also L4-L5 and L3-L4 and all this is mainly to the right side.  PMFS History: Patient Active Problem List   Diagnosis Date Noted  . Acute back pain with sciatica, right 09/04/2017  . Right hip pain 09/04/2017  . Thrombocytopenia ([place]) 06/25/2016  . Atrial fibrillation ([place]) 01/01/2016  . Iliac aneurysm ([place])   . Morbid obesity ([place]) 07/08/2014  . Type 2 diabetes  mellitus with other circulatory complications ([place]) 78/93/8101  . Chronic anticoagulation 12/21/2013  . Sleep disorder 06/21/2013  . Decreased hearing 08/06/2012  . Hemorrhoids 08/06/2012  . High risk medication use 05/05/2012  . Anticoagulation management encounter 05/05/2012  . Retinal tear 04/29/2012  . Personal history of colonic polyps 05/05/2011  . Visit for preventive health examination 05/05/2011  . PALPITATIONS 08/16/2010  . LIVER FUNCTION TESTS, ABNORMAL 04/25/2010  . Lateral epicondylitis 10/24/2009  . TOBACCO USE, QUIT 10/24/2009  . OBESITY, UNSPECIFIED 04/24/2009  . Hyperlipidemia 03/22/2009  . ERECTILE DYSFUNCTION 03/22/2009  . Obstructive sleep apnea 03/22/2009  . 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 MYOCARDIAL INFARCTION, HX OF 03/22/2009  . Coronary atherosclerosis 03/22/2009  . Atrial fibrillation with RVR ([place]) 03/22/2009  . GERD 03/22/2009   Past Medical History:  Diagnosis Date  . Atrial fibrillation ([place]) 03/22/2009   a. s/p multiple DCCV; b. no coumadin due to low TE risk profile; c. Tikosyn Rx  . Coronary atherosclerosis of native coronary artery 11/2002   a. s/p stent to LAD 12/03; OM2 occluded at cath 12/03; d. myoview 5/10: no ischemia;  e. echo 7/11: EF 55%, BAE, mild RVE, PASP 41-45; Myoview was in March 2013. There was no ischemia or infarction, EF 51%   . Cutaneous abscess of back excluding buttocks 07/04/2014   Appears to stem from possibly a cyst very large area 6 cm contact surgeon office   . Diabetes mellitus without complication ([place])   . Drusen body    see opth note  . ERECTILE DYSFUNCTION 03/22/2009  . GERD 03/22/2009  . HYPERGLYCEMIA 04/25/2010  . HYPERLIPIDEMIA 03/22/2009  . 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 LATERAL EPICONDYLITIS, LEFT 10/24/2009  . LIVER FUNCTION TESTS, ABNORMAL 04/25/2010  . Local reaction to immunization 05/05/2012   minor resolving  zostavax   . Myocardial infarction ([place]) mi2003  .  Numbness in left leg    foot related to back disease and surgery  . Obesity, unspecified 04/24/2009  . Perforated appendicitis with necrosis s/p open appendectomy 06/07/14 06/04/2014  . Renal cyst    Characterized by MRI as simple  . Ruptured suppurative appendicitis    2015   . SLEEP APNEA, OBSTRUCTIVE 03/22/2009   compliant with CPAP  . THROMBOCYTOPENIA 08/16/2010  . TOBACCO USE, QUIT 10/24/2009  . 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 Umbilical hernia     Family History  Problem Relation Age of Onset  . Thyroid disease Mother   . Ovarian cancer Mother   . Breast cancer Mother   . Lung cancer Father   . Cancer Father     Past Surgical History:  Procedure Laterality  Date  . APPENDECTOMY N/A 06/06/2014   Procedure: APPENDECTOMY;  Surgeon: Odis Hollingshead, MD;  Location: [facility];  Service: General;  Laterality: N/A;  . COLON SURGERY    . COLONOSCOPY  11/15/2011   Procedure: COLONOSCOPY;  Surgeon: Juanita Craver, MD;  Location: [facility];  Service: Endoscopy;  Laterality: N/A;  . COLONOSCOPY WITH PROPOFOL N/A 01/03/2017   Procedure: COLONOSCOPY WITH PROPOFOL;  Surgeon: Carol Ada, MD;  Location: [facility];  Service: Endoscopy;  Laterality: N/A;  . cyst on epiglottis  08/2002  . ESOPHAGOGASTRODUODENOSCOPY  11/15/2011   Procedure: ESOPHAGOGASTRODUODENOSCOPY (EGD);  Surgeon: Juanita Craver, MD;  [facility];  Service: Endoscopy;  Laterality: N/A;  . LAPAROSCOPIC APPENDECTOMY N/A 06/06/2014   Procedure: APPENDECTOMY LAPAROSCOPIC attemted;  Surgeon: Odis Hollingshead, MD;  Location: [facility];  Service: General;  Laterality: N/A;  . LUMBAR DISC SURGERY     two  holes in spinalcovering  . stent     LAD DUS 2004  . surgery l4-l5   1998   ruptured x 3  . ulnar neuropathy    . UMBILICAL HERNIA REPAIR     mesh   Social History   Occupational History  . Not on file.   Social History Main Topics  . Smoking status: Former Smoker    Packs/day: 2.00    Years: 42.00    Types:  Cigarettes    Quit date: 12/30/2008  . Smokeless tobacco: Never Used     Comment: started at age 64; 1-2 ppd; quit 2010  . Alcohol use No     Comment: rarely; maybe 1 beer a year  . Drug use: No  . Sexual activity: Yes

## 2017-09-05 ENCOUNTER — Telehealth (INDEPENDENT_AMBULATORY_CARE_PROVIDER_SITE_OTHER): Payer: Self-pay | Admitting: Radiology

## 2017-09-05 ENCOUNTER — Encounter (INDEPENDENT_AMBULATORY_CARE_PROVIDER_SITE_OTHER): Payer: Self-pay | Admitting: Orthopaedic Surgery

## 2017-09-05 MED ORDER — HYDROCODONE-ACETAMINOPHEN 5-325 MG PO TABS: 1.0000 | ORAL_TABLET | Freq: Four times a day (QID) | ORAL | 0 refills | Status: DC | PRN

## 2017-09-05 NOTE — Telephone Encounter (Signed)
Per Junie Panning ok for hydrocodone 1 po 6 hours #40 tablets no refill. Ok for Dr. Lorin Mercy to sign since Junie Panning said it was ok.

## 2017-09-05 NOTE — Telephone Encounter (Signed)
Ok per Julian for patient to have hydrocodone 5/325 1 po q 6 hours #40 no refills. Dr. Lorin Mercy signed this for patient, Junie Panning had left before rx was printed. Patient picked up this afternoon.

## 2017-09-09 ENCOUNTER — Telehealth (INDEPENDENT_AMBULATORY_CARE_PROVIDER_SITE_OTHER): Payer: Self-pay | Admitting: Physical Medicine and Rehabilitation

## 2017-09-09 NOTE — Telephone Encounter (Signed)
Patient scheduled for 09/22/17 at 1400 and will hold Pradaxa for 3 days prior per Dr. Stanford Breed.

## 2017-09-09 NOTE — Telephone Encounter (Signed)
-----   Message from Lelon Perla, MD sent at 09/09/2017  1:09 PM EDT ----- Regarding: RE: Pradaxa Hold pradaxa 3 days prior to injection and resume after when ok with Dr Ernestina Patches [name]  ----- Message ----- From: Sherre Scarlet, RT Sent: 09/09/2017  11:42 AM To: Lelon Perla, MD Subject: Pradaxa                                        Patient needs to schedule an epidural steroid injection with Dr. Ernestina Patches at [facility]. He will need to hold Pradaxa for 5 day prior to this. Is it alright for him to do this?

## 2017-09-18 ENCOUNTER — Encounter (INDEPENDENT_AMBULATORY_CARE_PROVIDER_SITE_OTHER): Payer: Medicare Other | Admitting: Physical Medicine and Rehabilitation

## 2017-09-19 ENCOUNTER — Encounter: Payer: Self-pay | Admitting: Internal Medicine

## 2017-09-22 ENCOUNTER — Ambulatory Visit (INDEPENDENT_AMBULATORY_CARE_PROVIDER_SITE_OTHER): Payer: Medicare Other | Admitting: Physical Medicine and Rehabilitation

## 2017-09-22 ENCOUNTER — Encounter (INDEPENDENT_AMBULATORY_CARE_PROVIDER_SITE_OTHER): Payer: Self-pay | Admitting: Physical Medicine and Rehabilitation

## 2017-09-22 ENCOUNTER — Ambulatory Visit (INDEPENDENT_AMBULATORY_CARE_PROVIDER_SITE_OTHER): Payer: Medicare Other

## 2017-09-22 VITALS — BP 125/67 | HR 70

## 2017-09-22 DIAGNOSIS — M5416 Radiculopathy, lumbar region: Secondary | ICD-10-CM

## 2017-09-22 MED ORDER — LIDOCAINE HCL (PF) 1 % IJ SOLN
2.0000 mL | Freq: Once | INTRAMUSCULAR | Status: AC
  Administered 2017-09-22: 2 mL

## 2017-09-22 MED ORDER — BETAMETHASONE SOD PHOS & ACET 6 (3-3) MG/ML IJ SUSP
12.0000 mg | Freq: Once | INTRAMUSCULAR | Status: AC
  Administered 2017-09-22: 12 mg

## 2017-09-22 NOTE — Patient Instructions (Signed)

## 2017-09-22 NOTE — Progress Notes (Deleted)
Right side low back pain into groin and down right leg to knee. Numbness around right knee. Leg weakness.
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
## 2017-09-30 ENCOUNTER — Ambulatory Visit (INDEPENDENT_AMBULATORY_CARE_PROVIDER_SITE_OTHER): Payer: Medicare Other | Admitting: Emergency Medicine

## 2017-09-30 DIAGNOSIS — Z23 Encounter for immunization: Secondary | ICD-10-CM | POA: Diagnosis not present

## 2017-10-02 ENCOUNTER — Ambulatory Visit (INDEPENDENT_AMBULATORY_CARE_PROVIDER_SITE_OTHER): Payer: Medicare Other | Admitting: Orthopaedic Surgery

## 2017-10-02 DIAGNOSIS — M5431 Sciatica, right side: Secondary | ICD-10-CM | POA: Diagnosis not present

## 2017-10-02 MED ORDER — DICLOFENAC EPOLAMINE 1.3 % TD PTCH: 1.0000 | MEDICATED_PATCH | Freq: Every day | TRANSDERMAL | 1 refills | Status: DC | PRN

## 2017-10-02 MED ORDER — TRAMADOL HCL 50 MG PO TABS: 50.0000 mg | ORAL_TABLET | Freq: Two times a day (BID) | ORAL | 0 refills | Status: DC | PRN

## 2017-10-02 NOTE — Progress Notes (Signed)
[name] [name] is following up after right side L3-L4 injection by Dr. Ernestina Patches in the lumbar spine. He says that is helped significantly but he still has leg weakness. He says his pain though is gone. We talked about the possibility of physical therapy versus even a chiropractor. He is going take this consideration. He did wish to have some refill of tramadol today.  On exam he is moving his legs much easier. There is no gross weakness but is just subtle. He has negative straight leg raising on the right side.  I did refill his tramadol. He still work on weight loss but he has lost 50 pounds over the last year. I'm happy to set up therapy he needs it. We'll see him back in a month says doing overall I did show him quadrant strengthening exercises on the try.

## 2017-10-04 ENCOUNTER — Other Ambulatory Visit: Payer: Self-pay | Admitting: Internal Medicine

## 2017-10-04 ENCOUNTER — Other Ambulatory Visit: Payer: Self-pay | Admitting: Cardiology

## 2017-10-06 ENCOUNTER — Other Ambulatory Visit: Payer: Self-pay | Admitting: Cardiology

## 2017-10-06 NOTE — Telephone Encounter (Signed)
Declined    This was used for acute back radiculitis  Back specialist should manage  Back medication

## 2017-10-06 NOTE — Telephone Encounter (Signed)
Rx request for Prednisone 10 mg Last OV 10/02/2017 Last refilled //08/19/2017 Next OV 12/26/2017 Please advise

## 2017-10-06 NOTE — Telephone Encounter (Signed)
Rx request sent to pharmacy.  

## 2017-10-20 LAB — HM DIABETES EYE EXAM

## 2017-10-29 ENCOUNTER — Other Ambulatory Visit: Payer: Self-pay | Admitting: Internal Medicine

## 2017-11-05 ENCOUNTER — Ambulatory Visit (INDEPENDENT_AMBULATORY_CARE_PROVIDER_SITE_OTHER): Payer: Medicare Other | Admitting: Orthopaedic Surgery

## 2017-11-05 ENCOUNTER — Encounter (INDEPENDENT_AMBULATORY_CARE_PROVIDER_SITE_OTHER): Payer: Self-pay | Admitting: Orthopaedic Surgery

## 2017-11-05 DIAGNOSIS — M5441 Lumbago with sciatica, right side: Secondary | ICD-10-CM | POA: Diagnosis not present

## 2017-11-05 NOTE — Progress Notes (Signed)
The patient is coming in for follow-up after having a right-sided L3 injection by Dr. Ernestina Patches.  He said he is doing much better overall and he has no pain at all.  On examination he has excellent flexion extension lumbar spine.  He has no radicular symptoms going down his right leg.  He has 5 out of 5 strength in all muscle groups the right lower extremity and normal sensation all dermatomes.  He had a good outcome from this injection.  He is going to still use common sense in terms of picking up items and being careful.  He will still work on [employer] and weight loss.  Questions and concerns were answered and addressed.  If this flares up on him again he will give [place] a call and we can set him up for another injection if it is the same area.

## 2017-11-09 ENCOUNTER — Other Ambulatory Visit: Payer: Self-pay | Admitting: Internal Medicine

## 2017-11-11 NOTE — Telephone Encounter (Signed)
Refill request for Medication: Pradaxa - Sig: TAKE 1 CAPSULE BY MOUTH EVERY 12 HOURS Last Filled: 03/25/17, #180 x 1 RF Previous / Upcoming Appt: upcoming 12/26/17  Please advise Dr Regis Bill, thanks.

## 2017-11-25 NOTE — Progress Notes (Deleted)
HPI: FU hx of CAD s/p stent to LAD in 2003, parox AFib, HL, OSA. He has seen Dr. Rayann Heman and ablation could be considered if he lost weight. Previous rash with cardizem; on tikosyn. Echo 6/17 showed normal function; grade 1 DD; mild LAE. Has had previous cardioversions for atrial fibrillation. His last cardioversion was in December 2017. 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 Followed by vascular surgery. Since last seen,   Current Outpatient Medications  Medication Sig Dispense Refill  . acetaminophen (TYLENOL) 325 MG tablet Take 650 mg by mouth every 6 (six) hours as needed for mild pain.    [name] albuterol (PROVENTIL HFA;VENTOLIN HFA) 108 (90 Base) MCG/ACT inhaler Inhale 2 puffs into the lungs every 6 (six) hours as needed for wheezing or shortness of breath. 1 Inhaler 0  . atorvastatin (LIPITOR) 80 MG tablet TAKE 1 TABLET BY MOUTH  DAILY 90 tablet 1  . cyclobenzaprine (FLEXERIL) 10 MG tablet Take 1 tablet (10 mg total) by mouth 3 (three) times daily as needed for muscle spasms. 30 tablet 0  . diazepam (VALIUM) 10 MG tablet Take one pill 30 minutes before MRI 1 tablet 0  . diclofenac (FLECTOR) 1.3 % PTCH Place 1 patch onto the skin daily as needed. 30 patch 1  . diphenhydrAMINE (BENADRYL) 25 MG tablet Take 25 mg by mouth at bedtime as needed for sleep.    [name] dofetilide (TIKOSYN) 500 MCG capsule TAKE 1 CAPSULE BY MOUTH TWO TIMES DAILY 180 capsule 1  . famotidine (PEPCID AC) 10 MG chewable tablet Chew 10 mg by mouth daily as needed for heartburn.    . fish oil-omega-3 fatty acids 1000 MG capsule Take 1 g by mouth 2 (two) times daily.     [name] HYDROcodone-acetaminophen (NORCO/VICODIN) 5-325 MG tablet Take 1 tablet by mouth every 6 (six) hours as needed for moderate pain. 40 tablet 0  . KLOR-CON M20 20 MEQ tablet TAKE 1 TABLET BY MOUTH EVERY DAY 90 tablet 2  . Magnesium Oxide 500 MG TABS Take 750 mg by mouth daily.    . metFORMIN (GLUCOPHAGE-XR) 500 MG 24 hr tablet TAKE 3 TABLETS BY  MOUTH  DAILY WITH BREAKFAST 270 tablet 0  . methocarbamol (ROBAXIN) 500 MG tablet Take 1-2 tablets (500-1,000 mg total) by mouth every 6 (six) hours as needed for muscle spasms. 40 tablet 0  . MULTIPLE VITAMIN PO Take 1 tablet by mouth every evening.     . nitroGLYCERIN (NITROSTAT) 0.4 MG SL tablet Place 1 tablet (0.4 mg total) under the tongue every 5 (five) minutes as needed for chest pain. 25 tablet 12  . ONETOUCH VERIO test strip USE TWICE A DAY 50 each 12  . pantoprazole (PROTONIX) 40 MG tablet TAKE 1 TABLET (40 MG TOTAL) BY MOUTH DAILY. 90 tablet 1  . PRADAXA 150 MG CAPS capsule TAKE 1 CAPSULE BY MOUTH  EVERY 12 HOURS 180 capsule 1  . predniSONE (DELTASONE) 10 MG tablet Take pills per day,,4,x 4 days  3 per day  x 4 days , 2  Per day for 4 days 1 per day for 4 days 40 tablet 1  . Suvorexant (BELSOMRA) 20 MG TABS Take 1 tablet by mouth at bedtime. 90 tablet 1  . traMADol (ULTRAM) 50 MG tablet Take 1-2 tablets (50-100 mg total) by mouth every 12 (twelve) hours as needed for moderate pain or severe pain. 60 tablet 0   No current facility-administered medications for this visit.  Past Medical History:  Diagnosis Date  . Atrial fibrillation ([place]) 03/22/2009   a. s/p multiple DCCV; b. no coumadin due to low TE risk profile; c. Tikosyn Rx  . Coronary atherosclerosis of native coronary artery 11/2002   a. s/p stent to LAD 12/03; OM2 occluded at cath 12/03; d. myoview 5/10: no ischemia;  e. echo 7/11: EF 55%, BAE, mild RVE, PASP 41-45; Myoview was in March 2013. There was no ischemia or infarction, EF 51%   . Cutaneous abscess of back excluding buttocks 07/04/2014   Appears to stem from possibly a cyst very large area 6 cm contact surgeon office   . Diabetes mellitus without complication ([place])   . Drusen body    see opth note  . ERECTILE DYSFUNCTION 03/22/2009  . GERD 03/22/2009  . HYPERGLYCEMIA 04/25/2010  . HYPERLIPIDEMIA 03/22/2009  . Iliac aneurysm ([facility])    2.6 to be evaluated incidental  finding on CT  . LATERAL EPICONDYLITIS, LEFT 10/24/2009  . LIVER FUNCTION TESTS, ABNORMAL 04/25/2010  . Local reaction to immunization 05/05/2012   minor resolving  zostavax   . Myocardial infarction ([place]) mi2003  . Numbness in left leg    foot related to back disease and surgery  . Obesity, unspecified 04/24/2009  . Perforated appendicitis with necrosis s/p open appendectomy 06/07/14 06/04/2014  . Renal cyst    Characterized by MRI as simple  . Ruptured suppurative appendicitis    2015   . SLEEP APNEA, OBSTRUCTIVE 03/22/2009   compliant with CPAP  . THROMBOCYTOPENIA 08/16/2010  . TOBACCO USE, QUIT 10/24/2009  . 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 Umbilical hernia     Past Surgical History:  Procedure Laterality Date  . APPENDECTOMY N/A 06/06/2014   Procedure: APPENDECTOMY;  Surgeon: Odis Hollingshead, MD;  Location: [facility];  Service: General;  Laterality: N/A;  . COLON SURGERY    . COLONOSCOPY  11/15/2011   Procedure: COLONOSCOPY;  Surgeon: Juanita Craver, MD;  [facility];  Service: Endoscopy;  Laterality: N/A;  . COLONOSCOPY WITH PROPOFOL N/A 01/03/2017   Procedure: COLONOSCOPY WITH PROPOFOL;  Surgeon: Carol Ada, MD;  [facility];  Service: Endoscopy;  Laterality: N/A;  . cyst on epiglottis  08/2002  . ESOPHAGOGASTRODUODENOSCOPY  11/15/2011   Procedure: ESOPHAGOGASTRODUODENOSCOPY (EGD);  Surgeon: Juanita Craver, MD;  [facility]: [facility];  Service: Endoscopy;  Laterality: N/A;  . LAPAROSCOPIC APPENDECTOMY N/A 06/06/2014   Procedure: APPENDECTOMY LAPAROSCOPIC attemted;  Surgeon: Odis Hollingshead, MD;  Location: [facility];  Service: General;  Laterality: N/A;  . LUMBAR DISC SURGERY     two  holes in spinalcovering  . stent     LAD DUS 2004  . surgery l4-l5   1998   ruptured x 3  . ulnar neuropathy    . UMBILICAL HERNIA REPAIR     mesh    Social History   Socioeconomic History  . Marital status: Married    Spouse name: Not on file  . Number of children: Not on  file  . Years of education: Not on file  . Highest education level: Not on file  Social Needs  . Financial resource strain: Not on file  . Food insecurity - worry: Not on file  . Food insecurity - inability: Not on file  . Transportation needs - medical: Not on file  . Transportation needs - non-medical: Not on file  Occupational History  . Not on file  Tobacco Use  . Smoking status: Former Smoker    Packs/day:  2.00    Years: 42.00    Pack years: 84.00    Types: Cigarettes    Last attempt to quit: 12/30/2008    Years since quitting: 8.9  . Smokeless tobacco: Never Used  . Tobacco comment: started at age 51; 1-2 ppd; quit 2010  Substance and Sexual Activity  . Alcohol use: No    Alcohol/week: 0.0 oz    Comment: rarely; maybe 1 beer a year  . Drug use: No  . Sexual activity: Yes  Other Topics Concern  . Not on file  Social History Narrative   Retired paramedic   Regular exercise-yes not recently    Has children   Wife is overweight and has fibromyalgia and depression on disability doesn't go out much. Back surgery    Has older dog   Retired from [employer] years of service now 7 years     Family History  Problem Relation Age of Onset  . Thyroid disease Mother   . Ovarian cancer Mother   . Breast cancer Mother   . Lung cancer Father   . Cancer Father     ROS: no fevers or chills, productive cough, hemoptysis, dysphasia, odynophagia, melena, hematochezia, dysuria, hematuria, rash, seizure activity, orthopnea, PND, pedal edema, claudication. Remaining systems are negative.  Physical Exam: Well-developed well-nourished in no acute distress.  Skin is warm and dry.  HEENT is normal.  Neck is supple.  Chest is clear to auscultation with normal expansion.  Cardiovascular exam is regular rate and rhythm.  Abdominal exam nontender or distended. No masses palpated. Extremities show no edema. neuro grossly intact  ECG- personally reviewed  A/P  1  [name],  MD

## 2017-12-08 ENCOUNTER — Ambulatory Visit: Payer: Medicare Other | Admitting: Cardiology

## 2017-12-08 ENCOUNTER — Encounter: Payer: Self-pay | Admitting: Internal Medicine

## 2017-12-11 ENCOUNTER — Telehealth: Payer: Self-pay | Admitting: Cardiology

## 2017-12-11 NOTE — Progress Notes (Signed)
HPI: FU hx of CAD s/p stent to LAD in 2003, parox AFib, HL, OSA. He has seen Dr. Rayann Heman and ablation could be considered if he lost weight. Previous rash with cardizem; on tikosyn. Echo 6/17 showed normal function; grade 1 DD; mild LAE. Has had previous cardioversions for atrial fibrillation. His last cardioversion was in December 2017. 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 Followed by vascular surgery. Since last seen,   Current Outpatient Medications  Medication Sig Dispense Refill  . acetaminophen (TYLENOL) 325 MG tablet Take 650 mg by mouth every 6 (six) hours as needed for mild pain.    [name] albuterol (PROVENTIL HFA;VENTOLIN HFA) 108 (90 Base) MCG/ACT inhaler Inhale 2 puffs into the lungs every 6 (six) hours as needed for wheezing or shortness of breath. 1 Inhaler 0  . atorvastatin (LIPITOR) 80 MG tablet TAKE 1 TABLET BY MOUTH  DAILY 90 tablet 1  . cyclobenzaprine (FLEXERIL) 10 MG tablet Take 1 tablet (10 mg total) by mouth 3 (three) times daily as needed for muscle spasms. 30 tablet 0  . diazepam (VALIUM) 10 MG tablet Take one pill 30 minutes before MRI 1 tablet 0  . diclofenac (FLECTOR) 1.3 % PTCH Place 1 patch onto the skin daily as needed. 30 patch 1  . diphenhydrAMINE (BENADRYL) 25 MG tablet Take 25 mg by mouth at bedtime as needed for sleep.    [name] dofetilide (TIKOSYN) 500 MCG capsule TAKE 1 CAPSULE BY MOUTH TWO TIMES DAILY 180 capsule 1  . famotidine (PEPCID AC) 10 MG chewable tablet Chew 10 mg by mouth daily as needed for heartburn.    . fish oil-omega-3 fatty acids 1000 MG capsule Take 1 g by mouth 2 (two) times daily.     [name] HYDROcodone-acetaminophen (NORCO/VICODIN) 5-325 MG tablet Take 1 tablet by mouth every 6 (six) hours as needed for moderate pain. 40 tablet 0  . KLOR-CON M20 20 MEQ tablet TAKE 1 TABLET BY MOUTH EVERY DAY 90 tablet 2  . Magnesium Oxide 500 MG TABS Take 750 mg by mouth daily.    . metFORMIN (GLUCOPHAGE-XR) 500 MG 24 hr tablet TAKE 3 TABLETS BY  MOUTH  DAILY WITH BREAKFAST 270 tablet 0  . methocarbamol (ROBAXIN) 500 MG tablet Take 1-2 tablets (500-1,000 mg total) by mouth every 6 (six) hours as needed for muscle spasms. 40 tablet 0  . MULTIPLE VITAMIN PO Take 1 tablet by mouth every evening.     . nitroGLYCERIN (NITROSTAT) 0.4 MG SL tablet Place 1 tablet (0.4 mg total) under the tongue every 5 (five) minutes as needed for chest pain. 25 tablet 12  . ONETOUCH VERIO test strip USE TWICE A DAY 50 each 12  . pantoprazole (PROTONIX) 40 MG tablet TAKE 1 TABLET (40 MG TOTAL) BY MOUTH DAILY. 90 tablet 1  . PRADAXA 150 MG CAPS capsule TAKE 1 CAPSULE BY MOUTH  EVERY 12 HOURS 180 capsule 1  . predniSONE (DELTASONE) 10 MG tablet Take pills per day,,4,x 4 days  3 per day  x 4 days , 2  Per day for 4 days 1 per day for 4 days 40 tablet 1  . Suvorexant (BELSOMRA) 20 MG TABS Take 1 tablet by mouth at bedtime. 90 tablet 1  . traMADol (ULTRAM) 50 MG tablet Take 1-2 tablets (50-100 mg total) by mouth every 12 (twelve) hours as needed for moderate pain or severe pain. 60 tablet 0   No current facility-administered medications for this visit.  Past Medical History:  Diagnosis Date  . Atrial fibrillation ([place]) 03/22/2009   a. s/p multiple DCCV; b. no coumadin due to low TE risk profile; c. Tikosyn Rx  . Coronary atherosclerosis of native coronary artery 11/2002   a. s/p stent to LAD 12/03; OM2 occluded at cath 12/03; d. myoview 5/10: no ischemia;  e. echo 7/11: EF 55%, BAE, mild RVE, PASP 41-45; Myoview was in March 2013. There was no ischemia or infarction, EF 51%   . Cutaneous abscess of back excluding buttocks 07/04/2014   Appears to stem from possibly a cyst very large area 6 cm contact surgeon office   . Diabetes mellitus without complication ([place])   . Drusen body    see opth note  . ERECTILE DYSFUNCTION 03/22/2009  . GERD 03/22/2009  . HYPERGLYCEMIA 04/25/2010  . HYPERLIPIDEMIA 03/22/2009  . Iliac aneurysm ([facility])    2.6 to be evaluated incidental  finding on CT  . LATERAL EPICONDYLITIS, LEFT 10/24/2009  . LIVER FUNCTION TESTS, ABNORMAL 04/25/2010  . Local reaction to immunization 05/05/2012   minor resolving  zostavax   . Myocardial infarction ([place]) mi2003  . Numbness in left leg    foot related to back disease and surgery  . Obesity, unspecified 04/24/2009  . Perforated appendicitis with necrosis s/p open appendectomy 06/07/14 06/04/2014  . Renal cyst    Characterized by MRI as simple  . Ruptured suppurative appendicitis    2015   . SLEEP APNEA, OBSTRUCTIVE 03/22/2009   compliant with CPAP  . THROMBOCYTOPENIA 08/16/2010  . TOBACCO USE, QUIT 10/24/2009  . 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 Umbilical hernia     Past Surgical History:  Procedure Laterality Date  . APPENDECTOMY N/A 06/06/2014   Procedure: APPENDECTOMY;  Surgeon: Odis Hollingshead, MD;  Location: [facility];  Service: General;  Laterality: N/A;  . COLON SURGERY    . COLONOSCOPY  11/15/2011   Procedure: COLONOSCOPY;  Surgeon: Juanita Craver, MD;  Location: [facility];  Service: Endoscopy;  Laterality: N/A;  . COLONOSCOPY WITH PROPOFOL N/A 01/03/2017   Procedure: COLONOSCOPY WITH PROPOFOL;  Surgeon: Carol Ada, MD;  Location: [facility];  Service: Endoscopy;  Laterality: N/A;  . cyst on epiglottis  08/2002  . ESOPHAGOGASTRODUODENOSCOPY  11/15/2011   Procedure: ESOPHAGOGASTRODUODENOSCOPY (EGD);  Surgeon: Juanita Craver, MD;  Location: [facility];  Service: Endoscopy;  Laterality: N/A;  . LAPAROSCOPIC APPENDECTOMY N/A 06/06/2014   Procedure: APPENDECTOMY LAPAROSCOPIC attemted;  Surgeon: Odis Hollingshead, MD;  Location: [facility];  Service: General;  Laterality: N/A;  . LUMBAR DISC SURGERY     two  holes in spinalcovering  . stent     LAD DUS 2004  . surgery l4-l5   1998   ruptured x 3  . ulnar neuropathy    . UMBILICAL HERNIA REPAIR     mesh    Social History   Socioeconomic History  . Marital status: Married    Spouse name: Not on file  . Number of children: Not on  file  . Years of education: Not on file  . Highest education level: Not on file  Social Needs  . Financial resource strain: Not on file  . Food insecurity - worry: Not on file  . Food insecurity - inability: Not on file  . Transportation needs - medical: Not on file  . Transportation needs - non-medical: Not on file  Occupational History  . Not on file  Tobacco Use  . Smoking status: Former Smoker    Packs/day:  2.00    Years: 42.00    Pack years: 84.00    Types: Cigarettes    Last attempt to quit: 12/30/2008    Years since quitting: 8.9  . Smokeless tobacco: Never Used  . Tobacco comment: started at age 15; 1-2 ppd; quit 2010  Substance and Sexual Activity  . Alcohol use: No    Alcohol/week: 0.0 oz    Comment: rarely; maybe 1 beer a year  . Drug use: No  . Sexual activity: Yes  Other Topics Concern  . Not on file  Social History Narrative   Retired paramedic   Regular exercise-yes not recently    Has children   Wife is overweight and has fibromyalgia and depression on disability doesn't go out much. Back surgery    Has older dog   Retired from [employer] 31 years of service now 7 years     Family History  Problem Relation Age of Onset  . Thyroid disease Mother   . Ovarian cancer Mother   . Breast cancer Mother   . Lung cancer Father   . Cancer Father     ROS: no fevers or chills, productive cough, hemoptysis, dysphasia, odynophagia, melena, hematochezia, dysuria, hematuria, rash, seizure activity, orthopnea, PND, pedal edema, claudication. Remaining systems are negative.  Physical Exam: Well-developed well-nourished in no acute distress.  Skin is warm and dry.  HEENT is normal.  Neck is supple.  Chest is clear to auscultation with normal expansion.  Cardiovascular exam is regular rate and rhythm.  Abdominal exam nontender or distended. No masses palpated. Extremities show no edema. neuro grossly intact  Electrocardiogram- sinus rhythm with normal axis and no ST  changes. Normal QT interval. Personally reviewed  A/P  1 paroxysmal atrial fibrillation-patient is in sinus rhythm today on examination. Continue tikosyn and pradaxa. Check hemoglobin, potassium, renal function and magnesium.  2 coronary artery disease-continue statin. He is not on aspirin given need for anticoagulation.  3 Hyperlipidemia-continue statin.Check lipids and liver.  4 history of iliac aneurysm-patient is followed by vascular surgery.  5 morbid obesity-we discussed the importance of weight loss.  [name], MD

## 2017-12-12 ENCOUNTER — Ambulatory Visit (INDEPENDENT_AMBULATORY_CARE_PROVIDER_SITE_OTHER): Payer: Medicare Other | Admitting: Cardiology

## 2017-12-12 ENCOUNTER — Encounter: Payer: Self-pay | Admitting: Cardiology

## 2017-12-12 ENCOUNTER — Other Ambulatory Visit: Payer: Self-pay

## 2017-12-12 VITALS — BP 142/74 | HR 62 | Ht 73.0 in | Wt 306.0 lb

## 2017-12-12 DIAGNOSIS — I4891 Unspecified atrial fibrillation: Secondary | ICD-10-CM | POA: Diagnosis not present

## 2017-12-12 DIAGNOSIS — I2510 Atherosclerotic heart disease of native coronary artery without angina pectoris: Secondary | ICD-10-CM

## 2017-12-12 DIAGNOSIS — E7800 Pure hypercholesterolemia, unspecified: Secondary | ICD-10-CM

## 2017-12-12 NOTE — Patient Instructions (Signed)
Lab work  ( cbc,bmet,magnesium,lipid and liver panels )   Your physician wants you to follow-up in: 6 months. You will receive a reminder letter in the mail two months in advance. If you don't receive a letter, please call our office to schedule the follow-up appointment.

## 2017-12-14 ENCOUNTER — Encounter: Payer: Self-pay | Admitting: Internal Medicine

## 2017-12-15 NOTE — Telephone Encounter (Signed)
Please advise Dr Regis Bill if okay for patient to have Cardiology's labs done in our office at his upcoming appt, thanks.

## 2017-12-17 ENCOUNTER — Ambulatory Visit (INDEPENDENT_AMBULATORY_CARE_PROVIDER_SITE_OTHER): Payer: Medicare Other | Admitting: Adult Health

## 2017-12-17 ENCOUNTER — Encounter: Payer: Self-pay | Admitting: Adult Health

## 2017-12-17 VITALS — BP 144/74 | Temp 98.1°F | Wt 304.0 lb

## 2017-12-17 DIAGNOSIS — J40 Bronchitis, not specified as acute or chronic: Secondary | ICD-10-CM

## 2017-12-17 MED ORDER — PREDNISONE 10 MG PO TABS: ORAL_TABLET | ORAL | 0 refills | Status: DC

## 2017-12-17 MED ORDER — IPRATROPIUM-ALBUTEROL 0.5-2.5 (3) MG/3ML IN SOLN
3.0000 mL | Freq: Once | RESPIRATORY_TRACT | Status: AC
  Administered 2017-12-17: 3 mL via RESPIRATORY_TRACT

## 2017-12-17 MED ORDER — DOXYCYCLINE HYCLATE 100 MG PO CAPS: 100.0000 mg | ORAL_CAPSULE | Freq: Two times a day (BID) | ORAL | 0 refills | Status: DC

## 2017-12-17 NOTE — Progress Notes (Signed)
Subjective:    Patient ID: [name] Chimes., male    DOB: [birth year], 66 y.o.   MRN: [id number]  URI   This is a new problem. The current episode started in the past 7 days. The problem has been waxing and waning. Maximum temperature: Subjective  The fever has been present for 1 to 2 days. Associated symptoms include congestion, coughing, headaches, rhinorrhea and wheezing. Pertinent negatives include no diarrhea, ear pain, sinus pain, sore throat or vomiting. He has tried decongestant and inhaler use for the symptoms. The treatment provided mild relief.    Review of Systems  Constitutional: Positive for activity change, fatigue and fever. Negative for chills.  HENT: Positive for congestion, rhinorrhea and sinus pressure. Negative for ear pain, sinus pain and sore throat.   Respiratory: Positive for cough, shortness of breath (with mild exertion ) and wheezing.   Gastrointestinal: Negative for diarrhea and vomiting.  Neurological: Positive for headaches.   See HPI   Past Medical History:  Diagnosis Date  . Atrial fibrillation ([place]) 03/22/2009   a. s/p multiple DCCV; b. no coumadin due to low TE risk profile; c. Tikosyn Rx  . Coronary atherosclerosis of native coronary artery 11/2002   a. s/p stent to LAD 12/03; OM2 occluded at cath 12/03; d. myoview 5/10: no ischemia;  e. echo 7/11: EF 55%, BAE, mild RVE, PASP 41-45; Myoview was in March 2013. There was no ischemia or infarction, EF 51%   . Cutaneous abscess of back excluding buttocks 07/04/2014   Appears to stem from possibly a cyst very large area 6 cm contact surgeon office   . Diabetes mellitus without complication ([place])   . Drusen body    see opth note  . ERECTILE DYSFUNCTION 03/22/2009  . GERD 03/22/2009  . HYPERGLYCEMIA 04/25/2010  . HYPERLIPIDEMIA 03/22/2009  . 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 LATERAL EPICONDYLITIS, LEFT 10/24/2009  . LIVER FUNCTION TESTS, ABNORMAL 04/25/2010  . Local  reaction to immunization 05/05/2012   minor resolving  zostavax   . Myocardial infarction ([place]) mi2003  . Numbness in left leg    foot related to back disease and surgery  . Obesity, unspecified 04/24/2009  . Perforated appendicitis with necrosis s/p open appendectomy 06/07/14 06/04/2014  . Renal cyst    Characterized by MRI as simple  . Ruptured suppurative appendicitis    2015   . SLEEP APNEA, OBSTRUCTIVE 03/22/2009   compliant with CPAP  . THROMBOCYTOPENIA 08/16/2010  . TOBACCO USE, QUIT 10/24/2009  . 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 Umbilical hernia     Social History   Socioeconomic History  . Marital status: Married    Spouse name: Not on file  . Number of children: Not on file  . Years of education: Not on file  . Highest education level: Not on file  Social Needs  . Financial resource strain: Not on file  . Food insecurity - worry: Not on file  . Food insecurity - inability: Not on file  . Transportation needs - medical: Not on file  . Transportation needs - non-medical: Not on file  Occupational History  . Not on file  Tobacco Use  . Smoking status: Former Smoker    Packs/day: 2.00    Years: 42.00    Pack years: 84.00    Types: Cigarettes    Last attempt to quit: 12/30/2008    Years since quitting: 8.9  . Smokeless tobacco: Never  Used  . Tobacco comment: started at age 69; 1-2 ppd; quit 2010  Substance and Sexual Activity  . Alcohol use: No    Alcohol/week: 0.0 oz    Comment: rarely; maybe 1 beer a year  . Drug use: No  . Sexual activity: Yes  Other Topics Concern  . Not on file  Social History Narrative   Retired paramedic   Regular exercise-yes not recently    Has children   Wife is overweight and has fibromyalgia and depression on disability doesn't go out much. Back surgery    Has older dog   Retired from [employer] 31 years of service now 7 years     Past Surgical History:  Procedure Laterality Date  . APPENDECTOMY N/A 06/06/2014   Procedure:  APPENDECTOMY;  Surgeon: Odis Hollingshead, MD;  [facility]: [facility];  Service: General;  Laterality: N/A;  . COLON SURGERY    . COLONOSCOPY  11/15/2011   Procedure: COLONOSCOPY;  Surgeon: Juanita Craver, MD;  [facility];  Service: Endoscopy;  Laterality: N/A;  . COLONOSCOPY WITH PROPOFOL N/A 01/03/2017   Procedure: COLONOSCOPY WITH PROPOFOL;  Surgeon: Carol Ada, MD;  [facility];  Service: Endoscopy;  Laterality: N/A;  . cyst on epiglottis  08/2002  . ESOPHAGOGASTRODUODENOSCOPY  11/15/2011   Procedure: ESOPHAGOGASTRODUODENOSCOPY (EGD);  Surgeon: Juanita Craver, MD;  [facility];  Service: Endoscopy;  Laterality: N/A;  . LAPAROSCOPIC APPENDECTOMY N/A 06/06/2014   Procedure: APPENDECTOMY LAPAROSCOPIC attemted;  Surgeon: Odis Hollingshead, MD;  Location: [facility];  Service: General;  Laterality: N/A;  . LUMBAR DISC SURGERY     two  holes in spinalcovering  . stent     LAD DUS 2004  . surgery l4-l5   1998   ruptured x 3  . ulnar neuropathy    . UMBILICAL HERNIA REPAIR     mesh    Family History  Problem Relation Age of Onset  . Thyroid disease Mother   . Ovarian cancer Mother   . Breast cancer Mother   . Lung cancer Father   . Cancer Father     Allergies  Allergen Reactions  . Penicillins     Childhood reaction-details unknown  . Procaine Hcl Hives and Other (See Comments)    Red in the face and neck.  First generation in 1950s, but after that, I have been able to tolerate novacaine and lidocaine.  . Pseudoephedrine Other (See Comments)    Patient went into afib  . Sulfonamide Derivatives     Childhood reaction   . Cardizem [Diltiazem] Rash    Current Outpatient Medications on File Prior to Visit  Medication Sig Dispense Refill  . acetaminophen (TYLENOL) 325 MG tablet Take 650 mg by mouth every 6 (six) hours as needed for mild pain.    [name] albuterol (PROVENTIL HFA;VENTOLIN HFA) 108 (90 Base) MCG/ACT inhaler Inhale 2 puffs into the lungs every 6 (six)  hours as needed for wheezing or shortness of breath. 1 Inhaler 0  . atorvastatin (LIPITOR) 80 MG tablet TAKE 1 TABLET BY MOUTH  DAILY 90 tablet 1  . cyclobenzaprine (FLEXERIL) 10 MG tablet Take 1 tablet (10 mg total) by mouth 3 (three) times daily as needed for muscle spasms. 30 tablet 0  . diazepam (VALIUM) 10 MG tablet Take one pill 30 minutes before MRI 1 tablet 0  . diclofenac (FLECTOR) 1.3 % PTCH Place 1 patch onto the skin daily as needed. 30 patch 1  . diphenhydrAMINE (BENADRYL) 25 MG tablet  Take 25 mg by mouth at bedtime as needed for sleep.    [name] dofetilide (TIKOSYN) 500 MCG capsule TAKE 1 CAPSULE BY MOUTH TWO TIMES DAILY 180 capsule 1  . famotidine (PEPCID AC) 10 MG chewable tablet Chew 10 mg by mouth daily as needed for heartburn.    . fish oil-omega-3 fatty acids 1000 MG capsule Take 1 g by mouth 2 (two) times daily.     [name] HYDROcodone-acetaminophen (NORCO/VICODIN) 5-325 MG tablet Take 1 tablet by mouth every 6 (six) hours as needed for moderate pain. 40 tablet 0  . KLOR-CON M20 20 MEQ tablet TAKE 1 TABLET BY MOUTH EVERY DAY 90 tablet 2  . Magnesium Oxide 500 MG TABS Take 750 mg by mouth daily.    . metFORMIN (GLUCOPHAGE-XR) 500 MG 24 hr tablet TAKE 3 TABLETS BY MOUTH  DAILY WITH BREAKFAST 270 tablet 0  . methocarbamol (ROBAXIN) 500 MG tablet Take 1-2 tablets (500-1,000 mg total) by mouth every 6 (six) hours as needed for muscle spasms. 40 tablet 0  . MULTIPLE VITAMIN PO Take 1 tablet by mouth every evening.     . nitroGLYCERIN (NITROSTAT) 0.4 MG SL tablet Place 1 tablet (0.4 mg total) under the tongue every 5 (five) minutes as needed for chest pain. 25 tablet 12  . ONETOUCH VERIO test strip USE TWICE A DAY 50 each 12  . pantoprazole (PROTONIX) 40 MG tablet TAKE 1 TABLET (40 MG TOTAL) BY MOUTH DAILY. 90 tablet 1  . PRADAXA 150 MG CAPS capsule TAKE 1 CAPSULE BY MOUTH  EVERY 12 HOURS 180 capsule 1  . predniSONE (DELTASONE) 10 MG tablet Take pills per day,,4,x 4 days  3 per day  x 4 days ,  2  Per day for 4 days 1 per day for 4 days 40 tablet 1  . Suvorexant (BELSOMRA) 20 MG TABS Take 1 tablet by mouth at bedtime. 90 tablet 1  . traMADol (ULTRAM) 50 MG tablet Take 1-2 tablets (50-100 mg total) by mouth every 12 (twelve) hours as needed for moderate pain or severe pain. 60 tablet 0   No current facility-administered medications on file prior to visit.     BP (!) 144/74 (BP Location: Left Arm)   Temp 98.1 F (36.7 C) (Oral)   Wt (!) 304 lb (137.9 kg)   BMI 40.11 kg/m       Objective:   Physical Exam  Constitutional: He is oriented to person, place, and time. He appears well-developed and well-nourished. No distress.  Cardiovascular: Normal rate, regular rhythm, normal heart sounds and intact distal pulses. Exam reveals no gallop and no friction rub.  No murmur heard. Pulmonary/Chest: Effort normal. No respiratory distress. He has wheezes. He has rhonchi. He has no rales. He exhibits no tenderness.  Neurological: He is alert and oriented to person, place, and time.  Skin: Skin is warm and dry. No rash noted. He is not diaphoretic. No erythema. No pallor.  Psychiatric: He has a normal mood and affect. His behavior is normal. Judgment and thought content normal.  Nursing note and vitals reviewed.     Assessment & Plan:  1. Bronchitis - Breathing had improved after duoneb but continues to have wheezing and rhonchi. Will prescribe antibiotic to cover for possible pneumonia. He was advised to drink plenty of water as the prednisone will increase his blood sugars - ipratropium-albuterol (DUONEB) 0.5-2.5 (3) MG/3ML nebulizer solution 3 mL - doxycycline (VIBRAMYCIN) 100 MG capsule; Take 1 capsule (100 mg total) by mouth 2 (  two) times daily.  Dispense: 14 capsule; Refill: 0 - predniSONE (DELTASONE) 10 MG tablet; 40 mg x 3 days, 20 mg x 3 days, 10 mg x 3 days  Dispense: 21 tablet; Refill: 0 - Follow up in 2-3 days if not feeling improved or sooner if symptoms worsen   Dorothyann Peng, NP

## 2017-12-17 NOTE — Patient Instructions (Signed)
I have prescribed Doxycycline and Prednisone for bronchitis   Please take as directed and let me know if you do not notice any improvement in the next 2-3 days

## 2017-12-22 NOTE — Progress Notes (Signed)
Chief Complaint  Patient presents with  . Follow-up    A1c. Pt states that his cough and congestion is improving. Still on abx and Prednisone.     HPI: [name] [name]. 66 y.o. come in for Chronic disease management   And acut problem rx  Last week  Still on meds but wheezing sob  much better  And no fever  Hemoptysis  Dm  Was 129      fbs this am  Hip and back   Issues  .    Nr [facility].  August September    Helped with injection  And prednisone   pred last week.  For resp wheezing  Bp ok  Lipids due for   Check per cards  On atrorva   ROS: See pertinent positives and negatives per HPI. No syncope vision off and on  No nnew numbness cp  Bleeding  Cardiology team asks for fasting blood work and can be done today   Past Medical History:  Diagnosis Date  . Atrial fibrillation ([place]) 03/22/2009   a. s/p multiple DCCV; b. no coumadin due to low TE risk profile; c. Tikosyn Rx  . Coronary atherosclerosis of native coronary artery 11/2002   a. s/p stent to LAD 12/03; OM2 occluded at cath 12/03; d. myoview 5/10: no ischemia;  e. echo 7/11: EF 55%, BAE, mild RVE, PASP 41-45; Myoview was in March 2013. There was no ischemia or infarction, EF 51%   . Cutaneous abscess of back excluding buttocks 07/04/2014   Appears to stem from possibly a cyst very large area 6 cm contact surgeon office   . Diabetes mellitus without complication ([place])   . Drusen body    see opth note  . ERECTILE DYSFUNCTION 03/22/2009  . GERD 03/22/2009  . HYPERGLYCEMIA 04/25/2010  . HYPERLIPIDEMIA 03/22/2009  . 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 LATERAL EPICONDYLITIS, LEFT 10/24/2009  . LIVER FUNCTION TESTS, ABNORMAL 04/25/2010  . Local reaction to immunization 05/05/2012   minor resolving  zostavax   . Myocardial infarction ([place]) mi2003  . Numbness in left leg    foot related to back disease and surgery  . Obesity, unspecified 04/24/2009  . Perforated appendicitis with  necrosis s/p open appendectomy 06/07/14 06/04/2014  . Renal cyst    Characterized by MRI as simple  . Ruptured suppurative appendicitis    2015   . SLEEP APNEA, OBSTRUCTIVE 03/22/2009   compliant with CPAP  . THROMBOCYTOPENIA 08/16/2010  . TOBACCO USE, QUIT 10/24/2009  . 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 Umbilical hernia     Family History  Problem Relation Age of Onset  . Thyroid disease Mother   . Ovarian cancer Mother   . Breast cancer Mother   . Lung cancer Father   . Cancer Father     Social History   Socioeconomic History  . Marital status: Married    Spouse name: None  . Number of children: None  . Years of education: None  . Highest education level: None  Social Needs  . Financial resource strain: None  . Food insecurity - worry: None  . Food insecurity - inability: None  . Transportation needs - medical: None  . Transportation needs - non-medical: None  Occupational History  . None  Tobacco Use  . Smoking status: Former Smoker    Packs/day: 2.00    Years: 42.00    Pack years: 84.00  Types: Cigarettes    Last attempt to quit: 12/30/2008    Years since quitting: 8.9  . Smokeless tobacco: Never Used  . Tobacco comment: started at age 33; 1-2 ppd; quit 2010  Substance and Sexual Activity  . Alcohol use: No    Alcohol/week: 0.0 oz    Comment: rarely; maybe 1 beer a year  . Drug use: No  . Sexual activity: Yes  Other Topics Concern  . None  Social History Narrative   Retired paramedic   Regular exercise-yes not recently    Has children   Wife is overweight and has fibromyalgia and depression on disability doesn't go out much. Back surgery    Has older dog   Retired from [employer] 31 years of service now 7 years     Outpatient Medications Prior to Visit  Medication Sig Dispense Refill  . acetaminophen (TYLENOL) 325 MG tablet Take 650 mg by mouth every 6 (six) hours as needed for mild pain.    [name] albuterol (PROVENTIL HFA;VENTOLIN HFA) 108 (90 Base) MCG/ACT  inhaler Inhale 2 puffs into the lungs every 6 (six) hours as needed for wheezing or shortness of breath. 1 Inhaler 0  . atorvastatin (LIPITOR) 80 MG tablet TAKE 1 TABLET BY MOUTH  DAILY 90 tablet 1  . diazepam (VALIUM) 10 MG tablet Take one pill 30 minutes before MRI 1 tablet 0  . diphenhydrAMINE (BENADRYL) 25 MG tablet Take 25 mg by mouth at bedtime as needed for sleep.    [name] dofetilide (TIKOSYN) 500 MCG capsule TAKE 1 CAPSULE BY MOUTH TWO TIMES DAILY 180 capsule 1  . doxycycline (VIBRAMYCIN) 100 MG capsule Take 1 capsule (100 mg total) by mouth 2 (two) times daily. 14 capsule 0  . famotidine (PEPCID AC) 10 MG chewable tablet Chew 10 mg by mouth daily as needed for heartburn.    . fish oil-omega-3 fatty acids 1000 MG capsule Take 1 g by mouth 2 (two) times daily.     [name] KLOR-CON M20 20 MEQ tablet TAKE 1 TABLET BY MOUTH EVERY DAY 90 tablet 2  . Magnesium Oxide 500 MG TABS Take 750 mg by mouth daily.    . metFORMIN (GLUCOPHAGE-XR) 500 MG 24 hr tablet TAKE 3 TABLETS BY MOUTH  DAILY WITH BREAKFAST 270 tablet 0  . MULTIPLE VITAMIN PO Take 1 tablet by mouth every evening.     . nitroGLYCERIN (NITROSTAT) 0.4 MG SL tablet Place 1 tablet (0.4 mg total) under the tongue every 5 (five) minutes as needed for chest pain. 25 tablet 12  . ONETOUCH VERIO test strip USE TWICE A DAY 50 each 12  . pantoprazole (PROTONIX) 40 MG tablet TAKE 1 TABLET (40 MG TOTAL) BY MOUTH DAILY. 90 tablet 1  . PRADAXA 150 MG CAPS capsule TAKE 1 CAPSULE BY MOUTH  EVERY 12 HOURS 180 capsule 1  . predniSONE (DELTASONE) 10 MG tablet 40 mg x 3 days, 20 mg x 3 days, 10 mg x 3 days 21 tablet 0  . Suvorexant (BELSOMRA) 20 MG TABS Take 1 tablet by mouth at bedtime. 90 tablet 1  . cyclobenzaprine (FLEXERIL) 10 MG tablet Take 1 tablet (10 mg total) by mouth 3 (three) times daily as needed for muscle spasms. (Patient not taking: Reported on 12/25/2017) 30 tablet 0  . diclofenac (FLECTOR) 1.3 % PTCH Place 1 patch onto the skin daily as needed.  (Patient not taking: Reported on 12/25/2017) 30 patch 1  . HYDROcodone-acetaminophen (NORCO/VICODIN) 5-325 MG tablet Take 1 tablet by mouth  every 6 (six) hours as needed for moderate pain. (Patient not taking: Reported on 12/25/2017) 40 tablet 0  . methocarbamol (ROBAXIN) 500 MG tablet Take 1-2 tablets (500-1,000 mg total) by mouth every 6 (six) hours as needed for muscle spasms. (Patient not taking: Reported on 12/25/2017) 40 tablet 0  . predniSONE (DELTASONE) 10 MG tablet Take pills per day,,4,x 4 days  3 per day  x 4 days , 2  Per day for 4 days 1 per day for 4 days (Patient not taking: Reported on 12/25/2017) 40 tablet 1  . traMADol (ULTRAM) 50 MG tablet Take 1-2 tablets (50-100 mg total) by mouth every 12 (twelve) hours as needed for moderate pain or severe pain. (Patient not taking: Reported on 12/25/2017) 60 tablet 0   No facility-administered medications prior to visit.      EXAM:  BP 132/76 (BP Location: Right Wrist, Patient Position: Sitting, Cuff Size: Normal)   Pulse 60   Temp 97.6 F (36.4 C) (Oral)   Wt 299 lb 12.8 oz (136 kg)   BMI 39.55 kg/m   Body mass index is 39.55 kg/m.  GENERAL: vitals reviewed and listed above, alert, oriented, appears well hydrated and in no acute distress ocass cough HEENT: atraumatic, conjunctiva  clear, no obvious abnormalities on inspection of external nose and ears  NECK: no obvious masses on inspection palpation  LUNGS: clear to auscultation bilaterally, no wheezes, rales or rhonchi, good air movement CV: HRRR, no clubbing cyanosis or  peripheral edema nl cap refill  Abdomen:  Sof,t normal bowel sounds without hepatosplenomegaly, no guarding rebound or masses no CVA tenderness MS: moves all extremities without noticeable focal  abnormality PSYCH: pleasant and cooperative, no obvious depression or anxiety Lab Results  Component Value Date   WBC 7.8 06/27/2017   HGB 16.3 06/27/2017   HCT 47.5 06/27/2017   PLT 152.0 06/27/2017   GLUCOSE  147 (H) 06/27/2017   CHOL 89 06/27/2017   TRIG 113.0 06/27/2017   HDL 34.20 (L) 06/27/2017   LDLCALC 33 06/27/2017   ALT 49 06/27/2017   AST 28 06/27/2017   NA 140 06/27/2017   K 4.2 06/27/2017   CL 104 06/27/2017   CREATININE 0.85 06/27/2017   BUN 15 06/27/2017   CO2 29 06/27/2017   TSH 2.05 06/27/2017   PSA 0.72 06/27/2017   INR 1.31 12/16/2016   HGBA1C 7.1 (H) 06/27/2017   MICROALBUR 16.7 (H) 06/27/2017   BP Readings from Last 3 Encounters:  12/25/17 132/76  12/17/17 (!) 144/74  12/12/17 (!) 142/74   Wt Readings from Last 3 Encounters:  12/25/17 299 lb 12.8 oz (136 kg)  12/17/17 (!) 304 lb (137.9 kg)  12/12/17 (!) 306 lb (138.8 kg)    ASSESSMENT AND PLAN:  Discussed the following assessment and plan:  Diabetes mellitus without complication ([place]) - Plan: Hemoglobin A1c, Hemoglobin A1c  Medication management - Plan: Hemoglobin A1c, Hemoglobin A1c  Type 2 diabetes mellitus with other circulatory complications (HCC)  Morbid obesity, unspecified obesity type (HCC)  Mixed hyperlipidemia  Thrombocytopenia (HCC)  Atrial fibrillation, unspecified type ([place]) - Plan: CBC with Differential/Platelet, Basic metabolic panel, Magnesium, Hepatic function panel, Lipid Panel w/o Chol/HDL Ratio  Atherosclerosis of native coronary artery of native heart without angina pectoris - Plan: CBC with Differential/Platelet, Basic metabolic panel, Magnesium, Hepatic function panel, Lipid Panel w/o Chol/HDL Ratio  Pure hypercholesterolemia - Plan: CBC with Differential/Platelet, Basic metabolic panel, Magnesium, Hepatic function panel, Lipid Panel w/o Chol/HDL Ratio  Wheezy bronchitis Prednisone therapy  For resp causes.   At this time.    Expectant management. Stable today   Weight loss  To continue    Do not think from serious underlying cause   ( had pain issues with back)    Will follow .   -Patient advised to return or notify health care team  if  new concerns arise.  Patient  Instructions  Will notify you  of labs when available. If all ok then cpx  In 6 months           Standley Brooking. Cordia Miklos M.D.

## 2017-12-22 NOTE — Telephone Encounter (Signed)
Blood work ordered by cards   is fine  But  Please have  Lab   adjust order  So results come to me and  Cardiology .    We should also do hg a1c    With labs

## 2017-12-25 ENCOUNTER — Ambulatory Visit (INDEPENDENT_AMBULATORY_CARE_PROVIDER_SITE_OTHER): Payer: Medicare Other | Admitting: Internal Medicine

## 2017-12-25 ENCOUNTER — Encounter: Payer: Self-pay | Admitting: Internal Medicine

## 2017-12-25 ENCOUNTER — Other Ambulatory Visit: Payer: Medicare Other

## 2017-12-25 VITALS — BP 132/76 | HR 60 | Temp 97.6°F | Wt 299.8 lb

## 2017-12-25 DIAGNOSIS — I4891 Unspecified atrial fibrillation: Secondary | ICD-10-CM | POA: Diagnosis not present

## 2017-12-25 DIAGNOSIS — I2510 Atherosclerotic heart disease of native coronary artery without angina pectoris: Secondary | ICD-10-CM | POA: Diagnosis not present

## 2017-12-25 DIAGNOSIS — E119 Type 2 diabetes mellitus without complications: Secondary | ICD-10-CM | POA: Diagnosis not present

## 2017-12-25 DIAGNOSIS — Z79899 Other long term (current) drug therapy: Secondary | ICD-10-CM | POA: Diagnosis not present

## 2017-12-25 DIAGNOSIS — E7800 Pure hypercholesterolemia, unspecified: Secondary | ICD-10-CM

## 2017-12-25 DIAGNOSIS — E1159 Type 2 diabetes mellitus with other circulatory complications: Secondary | ICD-10-CM

## 2017-12-25 DIAGNOSIS — E782 Mixed hyperlipidemia: Secondary | ICD-10-CM | POA: Diagnosis not present

## 2017-12-25 DIAGNOSIS — J40 Bronchitis, not specified as acute or chronic: Secondary | ICD-10-CM

## 2017-12-25 DIAGNOSIS — D696 Thrombocytopenia, unspecified: Secondary | ICD-10-CM

## 2017-12-25 LAB — HEMOGLOBIN A1C: Hgb A1c MFr Bld: 7 % — ABNORMAL HIGH (ref 4.6–6.5)

## 2017-12-25 NOTE — Telephone Encounter (Signed)
Pt seen today. Lab aware of orders and Dr Regis Bill ordered A1c.  Nothing further needed.

## 2017-12-25 NOTE — Telephone Encounter (Signed)
Close encounter 

## 2017-12-25 NOTE — Patient Instructions (Addendum)
Will notify you  of labs when available. If all ok then cpx  In 6 months

## 2017-12-26 ENCOUNTER — Encounter: Payer: Self-pay | Admitting: Internal Medicine

## 2017-12-26 ENCOUNTER — Ambulatory Visit: Payer: Medicare Other | Admitting: Internal Medicine

## 2017-12-26 LAB — HEPATIC FUNCTION PANEL
ALT: 74 IU/L — ABNORMAL HIGH (ref 0–44)
AST: 30 IU/L (ref 0–40)
Albumin: 4.5 g/dL (ref 3.6–4.8)
Alkaline Phosphatase: 65 IU/L (ref 39–117)
Bilirubin Total: 0.6 mg/dL (ref 0.0–1.2)
Bilirubin, Direct: 0.22 mg/dL (ref 0.00–0.40)
Total Protein: 6.8 g/dL (ref 6.0–8.5)

## 2017-12-26 LAB — CBC WITH DIFFERENTIAL/PLATELET
Basophils Absolute: 0 10*3/uL (ref 0.0–0.2)
Basos: 0 %
EOS (ABSOLUTE): 0.6 10*3/uL — ABNORMAL HIGH (ref 0.0–0.4)
Eos: 5 %
Hematocrit: 46.5 % (ref 37.5–51.0)
Hemoglobin: 16.1 g/dL (ref 13.0–17.7)
Immature Grans (Abs): 0 10*3/uL (ref 0.0–0.1)
Immature Granulocytes: 0 %
Lymphocytes Absolute: 3.4 10*3/uL — ABNORMAL HIGH (ref 0.7–3.1)
Lymphs: 32 %
MCH: 31.5 pg (ref 26.6–33.0)
MCHC: 34.6 g/dL (ref 31.5–35.7)
MCV: 91 fL (ref 79–97)
Monocytes Absolute: 0.9 10*3/uL (ref 0.1–0.9)
Monocytes: 8 %
Neutrophils Absolute: 5.9 10*3/uL (ref 1.4–7.0)
Neutrophils: 55 %
Platelets: 161 10*3/uL (ref 150–379)
RBC: 5.11 x10E6/uL (ref 4.14–5.80)
RDW: 13.3 % (ref 12.3–15.4)
WBC: 10.7 10*3/uL (ref 3.4–10.8)

## 2017-12-26 LAB — LIPID PANEL W/O CHOL/HDL RATIO
Cholesterol, Total: 95 mg/dL — ABNORMAL LOW (ref 100–199)
HDL: 44 mg/dL (ref >39)
LDL Calculated: 30 mg/dL (ref 0–99)
Triglycerides: 104 mg/dL (ref 0–149)
VLDL Cholesterol Cal: 21 mg/dL (ref 5–40)

## 2017-12-26 LAB — BASIC METABOLIC PANEL
BUN/Creatinine Ratio: 25 — ABNORMAL HIGH (ref 10–24)
BUN: 21 mg/dL (ref 8–27)
CO2: 24 mmol/L (ref 20–29)
Calcium: 9.9 mg/dL (ref 8.6–10.2)
Chloride: 101 mmol/L (ref 96–106)
Creatinine, Ser: 0.83 mg/dL (ref 0.76–1.27)
GFR calc Af Amer: 105 mL/min/{1.73_m2} (ref >59)
GFR calc non Af Amer: 90 mL/min/{1.73_m2} (ref >59)
Glucose: 138 mg/dL — ABNORMAL HIGH (ref 65–99)
Potassium: 4.4 mmol/L (ref 3.5–5.2)
Sodium: 139 mmol/L (ref 134–144)

## 2017-12-26 LAB — MAGNESIUM: Magnesium: 2 mg/dL (ref 1.6–2.3)

## 2017-12-26 MED ORDER — SUVOREXANT 20 MG PO TABS: 1.0000 | ORAL_TABLET | Freq: Every day | ORAL | 1 refills | Status: DC

## 2017-12-26 NOTE — Telephone Encounter (Signed)
Ok to refill x 6 months please send in

## 2017-12-26 NOTE — Telephone Encounter (Signed)
done

## 2017-12-26 NOTE — Telephone Encounter (Signed)
Pt is requesting a refill of his Belsomra.  Last filled 04/18/17, #90 (6 month worth) Seen 12/25/17, forgot to request refill at this time.   Okay to refill x 6 month?  Please advise Dr Regis Bill, thanks.

## 2017-12-29 ENCOUNTER — Encounter: Payer: Self-pay | Admitting: Internal Medicine

## 2017-12-29 MED ORDER — SUVOREXANT 20 MG PO TABS: 1.0000 | ORAL_TABLET | Freq: Every day | ORAL | 1 refills | Status: DC

## 2018-01-01 ENCOUNTER — Encounter: Payer: Self-pay | Admitting: Internal Medicine

## 2018-01-01 DIAGNOSIS — J40 Bronchitis, not specified as acute or chronic: Secondary | ICD-10-CM

## 2018-01-01 NOTE — Telephone Encounter (Signed)
Please advise Dr Regis Bill on patient e-mail. Pt not feeling any better since last OV with [facility] 12/17/17, thanks.

## 2018-01-01 NOTE — Telephone Encounter (Signed)
Spoke with patient, aware of need for cxr  Order placed - pt requested [facility] for location cxr Pt scheduled for appt with Dr Regis Bill 01/02/18 at 230 and cxr prior. Nothing further needed.

## 2018-01-01 NOTE — Telephone Encounter (Signed)
please order a chest x ray and have him come in tomorrow at   2:30

## 2018-01-02 ENCOUNTER — Ambulatory Visit (INDEPENDENT_AMBULATORY_CARE_PROVIDER_SITE_OTHER): Payer: Medicare Other

## 2018-01-02 ENCOUNTER — Ambulatory Visit (INDEPENDENT_AMBULATORY_CARE_PROVIDER_SITE_OTHER): Payer: Medicare Other | Admitting: Internal Medicine

## 2018-01-02 ENCOUNTER — Encounter: Payer: Self-pay | Admitting: Internal Medicine

## 2018-01-02 VITALS — BP 158/74 | HR 80 | Temp 98.3°F | Resp 18 | Wt 301.2 lb

## 2018-01-02 DIAGNOSIS — R05 Cough: Secondary | ICD-10-CM

## 2018-01-02 DIAGNOSIS — Z7901 Long term (current) use of anticoagulants: Secondary | ICD-10-CM | POA: Diagnosis not present

## 2018-01-02 DIAGNOSIS — J40 Bronchitis, not specified as acute or chronic: Secondary | ICD-10-CM

## 2018-01-02 DIAGNOSIS — R053 Chronic cough: Secondary | ICD-10-CM

## 2018-01-02 DIAGNOSIS — R918 Other nonspecific abnormal finding of lung field: Secondary | ICD-10-CM | POA: Diagnosis not present

## 2018-01-02 DIAGNOSIS — J209 Acute bronchitis, unspecified: Secondary | ICD-10-CM | POA: Diagnosis not present

## 2018-01-02 MED ORDER — IPRATROPIUM-ALBUTEROL 0.5-2.5 (3) MG/3ML IN SOLN
3.0000 mL | Freq: Four times a day (QID) | RESPIRATORY_TRACT | Status: DC
  Administered 2018-01-02: 3 mL via RESPIRATORY_TRACT

## 2018-01-02 MED ORDER — METHYLPREDNISOLONE ACETATE 80 MG/ML IJ SUSP
120.0000 mg | Freq: Once | INTRAMUSCULAR | Status: AC
  Administered 2018-01-02: 120 mg via INTRAMUSCULAR

## 2018-01-02 MED ORDER — FLUTICASONE FUROATE-VILANTEROL 200-25 MCG/INH IN AEPB: 1.0000 | INHALATION_SPRAY | Freq: Every day | RESPIRATORY_TRACT | 2 refills | Status: DC

## 2018-01-02 MED ORDER — PREDNISONE 20 MG PO TABS: 20.0000 mg | ORAL_TABLET | Freq: Two times a day (BID) | ORAL | 0 refills | Status: DC

## 2018-01-02 NOTE — Progress Notes (Signed)
Chief Complaint  Patient presents with  . Bronchitis    Saw Tommi Rumps 12/17/17 for bronchitis. Felt better at PCP visit on 12/25/17, then started coughing and sneezing again after that visit. Had CXR this morning. Took old cough medicine from 2016 w/ codeine this morning which helped the cough, but has felt nauseous since then.    HPI: [name] [name]. 67 y.o.   SDA  SEE PAST NOTES .   Had been rx with prednisone neb and antibiotic in December for bronchitis  But had relapsing sx in the past few days   sneezing nasal drainage and bad cough   No hemoptysis of fever ( had at onset in December. )  ROS: See pertinent positives and negatives per HPI.  No fever  Concern that cough could rupture his  Iliac aneurysm .coughs so hard        Past Medical History:  Diagnosis Date  . Atrial fibrillation ([place]) 03/22/2009   a. s/p multiple DCCV; b. no coumadin due to low TE risk profile; c. Tikosyn Rx  . Coronary atherosclerosis of native coronary artery 11/2002   a. s/p stent to LAD 12/03; OM2 occluded at cath 12/03; d. myoview 5/10: no ischemia;  e. echo 7/11: EF 55%, BAE, mild RVE, PASP 41-45; Myoview was in March 2013. There was no ischemia or infarction, EF 51%   . Cutaneous abscess of back excluding buttocks 07/04/2014   Appears to stem from possibly a cyst very large area 6 cm contact surgeon office   . Diabetes mellitus without complication ([place])   . Drusen body    see opth note  . ERECTILE DYSFUNCTION 03/22/2009  . GERD 03/22/2009  . HYPERGLYCEMIA 04/25/2010  . HYPERLIPIDEMIA 03/22/2009  . 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 LATERAL EPICONDYLITIS, LEFT 10/24/2009  . LIVER FUNCTION TESTS, ABNORMAL 04/25/2010  . Local reaction to immunization 05/05/2012   minor resolving  zostavax   . Myocardial infarction ([place]) mi2003  . Numbness in left leg    foot related to back disease and surgery  . Obesity, unspecified 04/24/2009  . Perforated appendicitis with necrosis  s/p open appendectomy 06/07/14 06/04/2014  . Renal cyst    Characterized by MRI as simple  . Ruptured suppurative appendicitis    2015   . SLEEP APNEA, OBSTRUCTIVE 03/22/2009   compliant with CPAP  . THROMBOCYTOPENIA 08/16/2010  . TOBACCO USE, QUIT 10/24/2009  . 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 Umbilical hernia     Family History  Problem Relation Age of Onset  . Thyroid disease Mother   . Ovarian cancer Mother   . Breast cancer Mother   . Lung cancer Father   . Cancer Father     Social History   Socioeconomic History  . Marital status: Married    Spouse name: None  . Number of children: None  . Years of education: None  . Highest education level: None  Social Needs  . Financial resource strain: None  . Food insecurity - worry: None  . Food insecurity - inability: None  . Transportation needs - medical: None  . Transportation needs - non-medical: None  Occupational History  . None  Tobacco Use  . Smoking status: Former Smoker    Packs/day: 2.00    Years: 42.00    Pack years: 84.00    Types: Cigarettes    Last attempt to quit: 12/30/2008    Years since quitting: 9.0  .  Smokeless tobacco: Never Used  . Tobacco comment: started at age 34; 1-2 ppd; quit 2010  Substance and Sexual Activity  . Alcohol use: No    Alcohol/week: 0.0 oz    Comment: rarely; maybe 1 beer a year  . Drug use: No  . Sexual activity: Yes  Other Topics Concern  . None  Social History Narrative   Retired paramedic   Regular exercise-yes not recently    Has children   Wife is overweight and has fibromyalgia and depression on disability doesn't go out much. Back surgery    Has older dog   Retired from [employer] 31 years of service now 7 years     Outpatient Medications Prior to Visit  Medication Sig Dispense Refill  . acetaminophen (TYLENOL) 325 MG tablet Take 650 mg by mouth every 6 (six) hours as needed for mild pain.    [name] albuterol (PROVENTIL HFA;VENTOLIN HFA) 108 (90 Base) MCG/ACT inhaler  Inhale 2 puffs into the lungs every 6 (six) hours as needed for wheezing or shortness of breath. 1 Inhaler 0  . atorvastatin (LIPITOR) 80 MG tablet TAKE 1 TABLET BY MOUTH  DAILY 90 tablet 1  . diphenhydrAMINE (BENADRYL) 25 MG tablet Take 25 mg by mouth at bedtime as needed for sleep.    [name] dofetilide (TIKOSYN) 500 MCG capsule TAKE 1 CAPSULE BY MOUTH TWO TIMES DAILY 180 capsule 1  . famotidine (PEPCID AC) 10 MG chewable tablet Chew 10 mg by mouth daily as needed for heartburn.    . fish oil-omega-3 fatty acids 1000 MG capsule Take 1 g by mouth 2 (two) times daily.     [name] KLOR-CON M20 20 MEQ tablet TAKE 1 TABLET BY MOUTH EVERY DAY 90 tablet 2  . Magnesium Oxide 500 MG TABS Take 750 mg by mouth daily.    . metFORMIN (GLUCOPHAGE-XR) 500 MG 24 hr tablet TAKE 3 TABLETS BY MOUTH  DAILY WITH BREAKFAST 270 tablet 0  . MULTIPLE VITAMIN PO Take 1 tablet by mouth every evening.     . nitroGLYCERIN (NITROSTAT) 0.4 MG SL tablet Place 1 tablet (0.4 mg total) under the tongue every 5 (five) minutes as needed for chest pain. 25 tablet 12  . ONETOUCH VERIO test strip USE TWICE A DAY 50 each 12  . pantoprazole (PROTONIX) 40 MG tablet TAKE 1 TABLET (40 MG TOTAL) BY MOUTH DAILY. (Patient taking differently: TAKE 1 TABLET (40 MG TOTAL) BY MOUTH DAILY PRN) 90 tablet 1  . PRADAXA 150 MG CAPS capsule TAKE 1 CAPSULE BY MOUTH  EVERY 12 HOURS 180 capsule 1  . Suvorexant (BELSOMRA) 20 MG TABS Take 1 tablet by mouth at bedtime. 90 tablet 1  . diazepam (VALIUM) 10 MG tablet Take one pill 30 minutes before MRI 1 tablet 0  . doxycycline (VIBRAMYCIN) 100 MG capsule Take 1 capsule (100 mg total) by mouth 2 (two) times daily. 14 capsule 0  . predniSONE (DELTASONE) 10 MG tablet 40 mg x 3 days, 20 mg x 3 days, 10 mg x 3 days 21 tablet 0   No facility-administered medications prior to visit.      EXAM:  BP (!) 158/74 (BP Location: Left Arm, Patient Position: Sitting, Cuff Size: Normal)   Pulse 80   Temp 98.3 F (36.8 C) (Oral)    Resp 18   Wt (!) 301 lb 3.2 oz (136.6 kg)   SpO2 95%   BMI 39.74 kg/m   Body mass index is 39.74 kg/m.  GENERAL: vitals reviewed and  listed above, alert, oriented, appears well hydrated and in no acute distress no toxic  Wheezy cough  Nasal congestion HEENT: atraumatic, conjunctiva  clear, no obvious abnormalities on inspection of external nose and ears tm clear OP : no lesion edema or exudate  Face nt  NECK: no obvious masses on inspection palpation  LUNGS:  Wheezy and bronchial sounds through no clearing  From coughing  No rales .  After duoneb improved but still expiratory wheezing  CV: HRRR, no clubbing cyanosis  nl cap refill  MS: moves all extremities without noticeable focal  abnormality PSYCH: pleasant and cooperative, no obvious depression or anxiety  ASSESSMENT AND PLAN:  Discussed the following assessment and plan:  Wheezy bronchitis - relapsing sx  - Plan: ipratropium-albuterol (DUONEB) 0.5-2.5 (3) MG/3ML nebulizer solution 3 mL, Ambulatory referral to Pulmonology  Cough, persistent - Plan: ipratropium-albuterol (DUONEB) 0.5-2.5 (3) MG/3ML nebulizer solution 3 mL, Ambulatory referral to Pulmonology  Abnormality of lung on chest x-ray - in interstitial markings    cw viral infection initially  rx for bacterial and pred   advise we get pulmonary evaluation in regard to lung statuspos ctpfts - Plan: Ambulatory referral to Pulmonology  Chronic anticoagulation - Plan: Ambulatory referral to Pulmonology reviewed of prev x ray and  Ct of abd  Poss   Underlying scarring  Other lung changes     No obv acute chf today  prob copd changes  Focal   See past abd ct scan  depmedrol   breo inhaler and pred today  albuterol as needed  Nausea seems from meds   Hr rr  No afib.   -Patient advised to return or notify health care team  if symptoms worsen ,persist or new concerns arise.  Patient Instructions  Depo medrol and n inhaler       And add  5 days of prednisone beginning tomorrow    Plan pulmonary  Referral    Begin  breo inhaler controller    In the interim   And albuterol as needed     Standley Brooking. Jomes Giraldo M.D.

## 2018-01-02 NOTE — Patient Instructions (Addendum)
Depo medrol and n inhaler       And add  5 days of prednisone beginning tomorrow  Plan pulmonary  Referral    Begin  breo inhaler controller    In the interim   And albuterol as needed

## 2018-01-02 NOTE — Addendum Note (Signed)
Addended by: Virl Cagey on: 01/02/2018 04:29 PM   Modules accepted: Orders

## 2018-02-09 ENCOUNTER — Encounter: Payer: Self-pay | Admitting: Pulmonary Disease

## 2018-02-09 ENCOUNTER — Institutional Professional Consult (permissible substitution): Payer: Medicare Other | Admitting: Pulmonary Disease

## 2018-02-09 ENCOUNTER — Other Ambulatory Visit: Payer: Self-pay | Admitting: Internal Medicine

## 2018-02-09 ENCOUNTER — Ambulatory Visit (INDEPENDENT_AMBULATORY_CARE_PROVIDER_SITE_OTHER): Payer: Medicare Other | Admitting: Pulmonary Disease

## 2018-02-09 VITALS — BP 138/82 | HR 60 | Ht 73.0 in | Wt 296.0 lb

## 2018-02-09 DIAGNOSIS — J432 Centrilobular emphysema: Secondary | ICD-10-CM | POA: Diagnosis not present

## 2018-02-09 DIAGNOSIS — G4733 Obstructive sleep apnea (adult) (pediatric): Secondary | ICD-10-CM

## 2018-02-09 NOTE — Progress Notes (Signed)
   Subjective:    Patient ID: [name] [name]., male    DOB: 04/17/1951, 67 y.o.   MRN: [id number]  HPI    Review of Systems  Constitutional: Negative for fever and unexpected weight change.  HENT: Negative for congestion, dental problem, ear pain, nosebleeds, postnasal drip, rhinorrhea, sneezing and trouble swallowing.   Eyes: Negative for redness and itching.  Respiratory: Negative for cough, chest tightness, shortness of breath and wheezing.   Cardiovascular: Negative for palpitations and leg swelling.  Gastrointestinal: Negative for nausea and vomiting.  Genitourinary: Negative for dysuria.  Musculoskeletal: Negative for joint swelling.  Skin: Negative for rash.  Allergic/Immunologic: Positive for environmental allergies. Negative for food allergies and immunocompromised state.  Neurological: Negative for headaches.  Hematological: Bruises/bleeds easily.  Psychiatric/Behavioral: Negative for dysphoric mood. The patient is not nervous/anxious.        Objective:   Physical Exam        Assessment & Plan:

## 2018-02-09 NOTE — Progress Notes (Signed)
[facility]  Chief Complaint  Patient presents with  . Pulmonary consult    referred by Dr Dorthula Perfect chest CT,chest xray,showed scarring in lower lobes    Vital signs: BP 138/82 (BP Location: Left Arm, Cuff Size: Normal)   Pulse 60   Ht 6\' 1"  (1.854 m)   Wt 296 lb (134.3 kg)   SpO2 94%   BMI 39.05 kg/m   History of Present Illness: [name] [name]. is a 67 y.o. male with OSA and abnormal chest xray.  He was seen in January by his PCP for asthmatic bronchitis.  He had a chest xray that showed abnormalities.  He has history of asthma as a child and was a smoker.  He had previous CT abdomen which showed changes of emphysema in his lower lungs.    He was having cough, wheeze, and chest congestion.  These have all improved.  He was started on breo and albuterol.  He is not sure how these are helping.  He denies skin rash, fever, leg swelling, joint swelling, chest pain.  He uses his CPAP nightly.  He would like to change to a Resmed Airfit F30 Medium size CPAP mask.  He uses [facility].  Physical Exam:  General - pleasant Eyes - pupils reactive ENT - no sinus tenderness, no oral exudate, no LAN Cardiac - regular, no murmur Chest - no wheeze, rales Abd - soft, non tender Ext - no edema Skin - no rashes Neuro - normal strength Psych - normal mood  Assessment/Plan:  Abnormal chest xray with prior history of tobacco abuse. - will arrange for high resolution CT chest and PFT to further assess - advised he could try change breo and albuterol to prn for now  Obstructive sleep apnea. - he is compliant with therapy and reports benefit from CPAP - continue auto CPAP  - will arrange for new CPAP mask  Insomnia. - belsomra through PCP   Patient Instructions  Will arrange for new CPAP mask  Will schedule high resolution CT chest and pulmonary function test  Follow up in 3 to 4 weeks    Chesley Mires, MD [place] 02/09/2018, 4:02 PM Pager:  [phone]  Flow Sheet  Pulmonary tests:  Sleep tests: PSG 12/26/02 >> AHI 25 Auto CPAP 01/12/17 to 02/10/17 >> used on 30 of 30 nights with average 4 hrs 11 min.  Average AHI 15.7 with median CPAP 15 and 95 th percentile CPAP 18 cm H2O  Cardiac tests: Echo 06/06/16 >> EF 55 to 60%, grade 1 DD  Review of Systems: Constitutional: Negative for fever and unexpected weight change.  HENT: Negative for congestion, dental problem, ear pain, nosebleeds, postnasal drip, rhinorrhea, sneezing and trouble swallowing.   Eyes: Negative for redness and itching.  Respiratory: Negative for cough, chest tightness, shortness of breath and wheezing.   Cardiovascular: Negative for palpitations and leg swelling.  Gastrointestinal: Negative for nausea and vomiting.  Genitourinary: Negative for dysuria.  Musculoskeletal: Negative for joint swelling.  Skin: Negative for rash.  Allergic/Immunologic: Positive for environmental allergies. Negative for food allergies and immunocompromised state.  Neurological: Negative for headaches.  Hematological: Bruises/bleeds easily.  Psychiatric/Behavioral: Negative for dysphoric mood. The patient is not nervous/anxious.    Past Medical History: He  has a past medical history of Atrial fibrillation ([place]) (03/22/2009), Coronary atherosclerosis of native coronary artery (11/2002), Cutaneous abscess of back excluding buttocks (07/04/2014), Diabetes mellitus without complication ([place]), [place] body, ERECTILE DYSFUNCTION (03/22/2009), GERD (  03/22/2009), HYPERGLYCEMIA (04/25/2010), HYPERLIPIDEMIA (03/22/2009), Iliac aneurysm ([place]), LATERAL EPICONDYLITIS, LEFT (10/24/2009), LIVER FUNCTION TESTS, ABNORMAL (04/25/2010), Local reaction to immunization (05/05/2012), Myocardial infarction ([name]) (mi2003), Numbness in left leg, Obesity, unspecified (04/24/2009), Perforated appendicitis with necrosis s/p open appendectomy 06/07/14 (06/04/2014), Renal cyst,  Ruptured suppurative appendicitis, SLEEP APNEA, OBSTRUCTIVE (03/22/2009), THROMBOCYTOPENIA (08/16/2010), TOBACCO USE, QUIT (10/24/2009), ULNAR NEUROPATHY, LEFT (1/61/0960), and Umbilical hernia.  Past Surgical History: He  has a past surgical history that includes cyst on epiglottis (08/2002); surgery l4-l5  (1998); stent; ulnar neuropathy; Umbilical hernia repair; Lumbar disc surgery; Esophagogastroduodenoscopy (11/15/2011); Colonoscopy (11/15/2011); laparoscopic appendectomy (N/A, 06/06/2014); Colon surgery; Appendectomy (N/A, 06/06/2014); and Colonoscopy with propofol (N/A, 01/03/2017).  Family History: His family history includes Breast cancer in his mother; Cancer in his father; Lung cancer in his father; Ovarian cancer in his mother; Thyroid disease in his mother.  Social History: He  reports that he quit smoking about 9 years ago. His smoking use included cigarettes. He has a 84.00 pack-year smoking history. he has never used smokeless tobacco. He reports that he does not drink alcohol or use drugs.  Medications: Allergies as of 02/09/2018      Reactions   Penicillins    Childhood reaction-details unknown   Procaine Hcl Hives, Other (See Comments)   Red in the face and neck.  First generation in 1950s, but after that, I have been able to tolerate novacaine and lidocaine.   Pseudoephedrine Other (See Comments)   Patient went into afib   Sulfonamide Derivatives    Childhood reaction    Cardizem [diltiazem] Rash      Medication List        Accurate as of 02/09/18  4:02 PM. Always use your most recent med list.          acetaminophen 325 MG tablet Commonly known as:  TYLENOL Take 650 mg by mouth every 6 (six) hours as needed for mild pain.   albuterol 108 (90 Base) MCG/ACT inhaler Commonly known as:  PROVENTIL HFA;VENTOLIN HFA Inhale 2 puffs into the lungs every 6 (six) hours as needed for wheezing or shortness of breath.   atorvastatin 80 MG tablet Commonly known as:   LIPITOR TAKE 1 TABLET BY MOUTH  DAILY   diphenhydrAMINE 25 MG tablet Commonly known as:  BENADRYL Take 25 mg by mouth at bedtime as needed for sleep.   dofetilide 500 MCG capsule Commonly known as:  TIKOSYN TAKE 1 CAPSULE BY MOUTH TWO TIMES DAILY   famotidine 10 MG chewable tablet Commonly known as:  PEPCID AC Chew 10 mg by mouth daily as needed for heartburn.   fish oil-omega-3 fatty acids 1000 MG capsule Take 1 g by mouth 2 (two) times daily.   fluticasone furoate-vilanterol 200-25 MCG/INH Aepb Commonly known as:  BREO ELLIPTA Inhale 1 puff into the lungs daily.   KLOR-CON M20 20 MEQ tablet Generic drug:  potassium chloride SA TAKE 1 TABLET BY MOUTH EVERY DAY   Magnesium Oxide 500 MG Tabs Take 750 mg by mouth daily.   metFORMIN 500 MG 24 hr tablet Commonly known as:  GLUCOPHAGE-XR TAKE 3 TABLETS BY MOUTH  DAILY WITH BREAKFAST   MULTIPLE VITAMIN PO Take 1 tablet by mouth every evening.   nitroGLYCERIN 0.4 MG SL tablet Commonly known as:  NITROSTAT Place 1 tablet (0.4 mg total) under the tongue every 5 (five) minutes as needed for chest pain.   ONETOUCH VERIO test strip Generic drug:  glucose blood USE TWICE A DAY   pantoprazole 40 MG  tablet Commonly known as:  PROTONIX TAKE 1 TABLET (40 MG TOTAL) BY MOUTH DAILY.   PRADAXA 150 MG Caps capsule Generic drug:  dabigatran TAKE 1 CAPSULE BY MOUTH  EVERY 12 HOURS   Suvorexant 20 MG Tabs Commonly known as:  BELSOMRA Take 1 tablet by mouth at bedtime.

## 2018-02-09 NOTE — Patient Instructions (Signed)
Will arrange for new CPAP mask  Will schedule high resolution CT chest and pulmonary function test  Follow up in 3 to 4 weeks

## 2018-02-11 ENCOUNTER — Ambulatory Visit (INDEPENDENT_AMBULATORY_CARE_PROVIDER_SITE_OTHER): Payer: Medicare Other | Admitting: Internal Medicine

## 2018-02-11 ENCOUNTER — Ambulatory Visit: Payer: Self-pay | Admitting: *Deleted

## 2018-02-11 ENCOUNTER — Encounter: Payer: Self-pay | Admitting: Internal Medicine

## 2018-02-11 VITALS — BP 110/68 | HR 75 | Temp 98.3°F | Wt 296.6 lb

## 2018-02-11 DIAGNOSIS — L03116 Cellulitis of left lower limb: Secondary | ICD-10-CM

## 2018-02-11 DIAGNOSIS — Z7901 Long term (current) use of anticoagulants: Secondary | ICD-10-CM

## 2018-02-11 DIAGNOSIS — E1159 Type 2 diabetes mellitus with other circulatory complications: Secondary | ICD-10-CM | POA: Diagnosis not present

## 2018-02-11 MED ORDER — DOXYCYCLINE HYCLATE 100 MG PO CAPS: 100.0000 mg | ORAL_CAPSULE | Freq: Two times a day (BID) | ORAL | 0 refills | Status: DC

## 2018-02-11 NOTE — Patient Instructions (Addendum)
I agree  This is cellulitis . And  Agree with antibiotic.   Doxycycline.    Local care    .  expect significant improvment in the next 48 hours.  Elevate as possibel      Cellulitis, Adult Cellulitis is a skin infection. The infected area is usually red and tender. This condition occurs most often in the arms and lower legs. The infection can travel to the muscles, blood, and underlying tissue and become serious. It is very important to get treated for this condition. What are the causes? Cellulitis is caused by bacteria. The bacteria enter through a break in the skin, such as a cut, burn, insect bite, open sore, or crack. What increases the risk? This condition is more likely to occur in people who:  Have a weak defense system (immune system).  Have open wounds on the skin such as cuts, burns, bites, and scrapes. Bacteria can enter the body through these open wounds.  Are older.  Have diabetes.  Have a type of long-lasting (chronic) liver disease (cirrhosis) or kidney disease.  Use IV drugs.  What are the signs or symptoms? Symptoms of this condition include:  Redness, streaking, or spotting on the skin.  Swollen area of the skin.  Tenderness or pain when an area of the skin is touched.  Warm skin.  Fever.  Chills.  Blisters.  How is this diagnosed? This condition is diagnosed based on a medical history and physical exam. You may also have tests, including:  Blood tests.  Lab tests.  Imaging tests.  How is this treated? Treatment for this condition may include:  Medicines, such as antibiotic medicines or antihistamines.  Supportive care, such as rest and application of cold or warm cloths (cold or warm compresses) to the skin.  Hospital care, if the condition is severe.  The infection usually gets better within 1-2 days of treatment. Follow these instructions at home:  Take over-the-counter and prescription medicines only as told by your health  care provider.  If you were prescribed an antibiotic medicine, take it as told by your health care provider. Do not stop taking the antibiotic even if you start to feel better.  Drink enough fluid to keep your urine clear or pale yellow.  Do not touch or rub the infected area.  Raise (elevate) the infected area above the level of your heart while you are sitting or lying down.  Apply warm or cold compresses to the affected area as told by your health care provider.  Keep all follow-up visits as told by your health care provider. This is important. These visits let your health care provider make sure a more serious infection is not developing. Contact a health care provider if:  You have a fever.  Your symptoms do not improve within 1-2 days of starting treatment.  Your bone or joint underneath the infected area becomes painful after the skin has healed.  Your infection returns in the same area or another area.  You notice a swollen bump in the infected area.  You develop new symptoms.  You have a general ill feeling (malaise) with muscle aches and pains. Get help right away if:  Your symptoms get worse.  You feel very sleepy.  You develop vomiting or diarrhea that persists.  You notice red streaks coming from the infected area.  Your red area gets larger or turns dark in color. This information is not intended to replace advice given to you by your health  care provider. Make sure you discuss any questions you have with your health care provider. Document Released: 09/25/2005 Document Revised: 04/25/2016 Document Reviewed: 10/25/2015 Elsevier Interactive Patient Education  [employer].

## 2018-02-11 NOTE — Telephone Encounter (Signed)
Pt is here now in office to see Dr. Regis Bill.

## 2018-02-11 NOTE — Telephone Encounter (Signed)
Pt reports "mild" swelling, redness and warmth left calf area, extends down to ankle, onset yesterday. LGT last night 99.1 . Reports darker red area "size of silver dollar" above knee posteriorly. 7/10 pain. Denies SOB, chest pain. Pt states has "multiple scabs, small open areas on leg."  H/O cellulitis. Pt directed to ED, r/o DVT, further evaluation, declines. 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  Requesting appt with Dr. Regis Bill only. Apt made for today at 12 noon. Pt made aware he may be sent to ED from office. Care advice given per protocol. Reason for Disposition . [1] Thigh or calf pain AND [2] only 1 side AND [3] present > 1 hour  Answer Assessment - Initial Assessment Questions 1. ONSET: "When did the swelling start?" (e.g., minutes, hours, days)   Yesterday 2. LOCATION: "What part of the leg is swollen?"  "Are both legs swollen or just one leg?"     Left leg, calf to ankle posteriorly. 3. SEVERITY: "How bad is the swelling?" (e.g., localized; mild, moderate, severe)  - Localized - small area of swelling localized to one leg  - MILD pedal edema - swelling limited to foot and ankle, pitting edema < 1/4 inch (6 mm) deep, rest and elevation eliminate most or all swelling  - MODERATE edema - swelling of lower leg to knee, pitting edema > 1/4 inch (6 mm) deep, rest and elevation only partially reduce swelling  - SEVERE edema - swelling extends above knee, facial or hand swelling present      Pitting yesterday; mild presently; just got up from supine 4. REDNESS: "Does the swelling look red or infected?"     Red, warm 5. PAIN: "Is the swelling painful to touch?" If so, ask: "How painful is it?"   (Scale 1-10; mild, moderate or severe)     7/10 6. FEVER: "Do you have a fever?" If so, ask: "What is it, how was it measured, and when did it start?"      Last night 99.1 7. CAUSE: "What do you think is causing the leg swelling?"     May be cellulitis  8. MEDICAL  HISTORY: "Do you have a history of heart failure, kidney disease, liver failure, or cancer?"    Stent placed  2005 9. RECURRENT SYMPTOM: "Have you had leg swelling before?" If so, ask: "When was the last time?" "What happened that time?"     Cellulitis 2010, resolved with ATB. 10. OTHER SYMPTOMS: "Do you have any other symptoms?" (e.g., chest pain, difficulty breathing) Pain 7/10  Protocols used: LEG SWELLING AND EDEMA-A-AH

## 2018-02-11 NOTE — Telephone Encounter (Signed)
Monitor for patient arrival here.

## 2018-02-11 NOTE — Progress Notes (Signed)
Chief Complaint  Patient presents with  . Rash    left calf. Swelling and redness. Hot to the touch. Pain, 7/10. Low grade temp x 1 day, 99.1. New spot on inner left thigh. Hx of cellulitis    HPI: [name] [name]. 67 y.o.   SDA comes in today for possible infection in his left lower extremity. He was in his usual state of health when he noted some itchy area on his left medial leg.  He began scratching it.  3 days ago.  Yesterday evening he noted no itching but increasing pain redness and swelling on the left lower extremity and today some blotchy redness that is tender on the medial thigh. He is on anticoagulation therapy no active bleeding or other trauma.  He had some leftover antibiotic from SBE prophylaxis from his stent.  And took it last night 1 dose symptoms are slightly better this morning clindamycin 150  4  X 1 dose  He has had a low-grade fever but nothing high with chills has a history of cellulitis in the leg last treated in 2013 when he was sent to the emergency room had a negative DVT but cellulitis and treated with doxycycline.  Pulmonary status stable he is currently under evaluation to get PFTs and a baseline chest CT. ROS: See pertinent positives and negatives per HPI.  Past Medical History:  Diagnosis Date  . Atrial fibrillation ([place]) 03/22/2009   a. s/p multiple DCCV; b. no coumadin due to low TE risk profile; c. Tikosyn Rx  . Coronary atherosclerosis of native coronary artery 11/2002   a. s/p stent to LAD 12/03; OM2 occluded at cath 12/03; d. myoview 5/10: no ischemia;  e. echo 7/11: EF 55%, BAE, mild RVE, PASP 41-45; Myoview was in March 2013. There was no ischemia or infarction, EF 51%   . Cutaneous abscess of back excluding buttocks 07/04/2014   Appears to stem from possibly a cyst very large area 6 cm contact surgeon office   . Diabetes mellitus without complication ([place])   . Drusen body    see opth note  . ERECTILE DYSFUNCTION 03/22/2009  . GERD 03/22/2009    . HYPERGLYCEMIA 04/25/2010  . HYPERLIPIDEMIA 03/22/2009  . 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 LATERAL EPICONDYLITIS, LEFT 10/24/2009  . LIVER FUNCTION TESTS, ABNORMAL 04/25/2010  . Local reaction to immunization 05/05/2012   minor resolving  zostavax   . Myocardial infarction ([place]) mi2003  . Numbness in left leg    foot related to back disease and surgery  . Obesity, unspecified 04/24/2009  . Perforated appendicitis with necrosis s/p open appendectomy 06/07/14 06/04/2014  . Renal cyst    Characterized by MRI as simple  . Ruptured suppurative appendicitis    2015   . SLEEP APNEA, OBSTRUCTIVE 03/22/2009   compliant with CPAP  . THROMBOCYTOPENIA 08/16/2010  . TOBACCO USE, QUIT 10/24/2009  . 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 Umbilical hernia     Family History  Problem Relation Age of Onset  . Thyroid disease Mother   . Ovarian cancer Mother   . Breast cancer Mother   . Lung cancer Father   . Cancer Father     Social History   Socioeconomic History  . Marital status: Married    Spouse name: None  . Number of children: None  . Years of education: None  . Highest education level: None  Social Needs  . Financial  resource strain: None  . Food insecurity - worry: None  . Food insecurity - inability: None  . Transportation needs - medical: None  . Transportation needs - non-medical: None  Occupational History  . None  Tobacco Use  . Smoking status: Former Smoker    Packs/day: 2.00    Years: 42.00    Pack years: 84.00    Types: Cigarettes    Last attempt to quit: 12/30/2008    Years since quitting: 9.1  . Smokeless tobacco: Never Used  . Tobacco comment: started at age 18; 1-2 ppd; quit 2010  Substance and Sexual Activity  . Alcohol use: No    Alcohol/week: 0.0 oz    Comment: rarely; maybe 1 beer a year  . Drug use: No  . Sexual activity: Yes  Other Topics Concern  . None  Social History Narrative   Retired paramedic   Regular  exercise-yes not recently    Has children   Wife is overweight and has fibromyalgia and depression on disability doesn't go out much. Back surgery    Has older dog   Retired from [employer] 31 years of service now 7 years     Outpatient Medications Prior to Visit  Medication Sig Dispense Refill  . acetaminophen (TYLENOL) 325 MG tablet Take 650 mg by mouth every 6 (six) hours as needed for mild pain.    [name] albuterol (PROVENTIL HFA;VENTOLIN HFA) 108 (90 Base) MCG/ACT inhaler Inhale 2 puffs into the lungs every 6 (six) hours as needed for wheezing or shortness of breath. 1 Inhaler 0  . atorvastatin (LIPITOR) 80 MG tablet TAKE 1 TABLET BY MOUTH  DAILY 90 tablet 1  . diphenhydrAMINE (BENADRYL) 25 MG tablet Take 25 mg by mouth at bedtime as needed for sleep.    [name] dofetilide (TIKOSYN) 500 MCG capsule TAKE 1 CAPSULE BY MOUTH TWO TIMES DAILY 180 capsule 1  . famotidine (PEPCID) 10 MG tablet Take 10 mg by mouth daily.    . fish oil-omega-3 fatty acids 1000 MG capsule Take 1 g by mouth 2 (two) times daily.     . fluticasone furoate-vilanterol (BREO ELLIPTA) 200-25 MCG/INH AEPB Inhale 1 puff into the lungs daily. 1 each 2  . KLOR-CON M20 20 MEQ tablet TAKE 1 TABLET BY MOUTH EVERY DAY 90 tablet 2  . Magnesium Oxide 500 MG TABS Take 750 mg by mouth daily.    . metFORMIN (GLUCOPHAGE-XR) 500 MG 24 hr tablet TAKE 3 TABLETS BY MOUTH  DAILY WITH BREAKFAST (Patient taking differently: TAKE 1 breakfast,1 lunch,1 at night) 270 tablet 0  . MULTIPLE VITAMIN PO Take 1 tablet by mouth every evening.     . nitroGLYCERIN (NITROSTAT) 0.4 MG SL tablet Place 1 tablet (0.4 mg total) under the tongue every 5 (five) minutes as needed for chest pain. 25 tablet 12  . ONETOUCH VERIO test strip USE TWICE A DAY 50 each 12  . pantoprazole (PROTONIX) 40 MG tablet TAKE 1 TABLET (40 MG TOTAL) BY MOUTH DAILY. (Patient taking differently: TAKE 1 TABLET (40 MG TOTAL) BY MOUTH DAILY PRN) 90 tablet 1  . PRADAXA 150 MG CAPS capsule TAKE 1 CAPSULE  BY MOUTH  EVERY 12 HOURS 180 capsule 1  . Suvorexant (BELSOMRA) 20 MG TABS Take 1 tablet by mouth at bedtime. 90 tablet 1  . famotidine (PEPCID AC) 10 MG chewable tablet Chew 10 mg by mouth daily as needed for heartburn.     Facility-Administered Medications Prior to Visit  Medication Dose Route Frequency Provider  Last Rate Last Dose  . ipratropium-albuterol (DUONEB) 0.5-2.5 (3) MG/3ML nebulizer solution 3 mL  3 mL Nebulization Q6H Sullivan Jacuinde, Standley Brooking, MD   3 mL at 01/02/18 1545     EXAM:  BP 110/68 (BP Location: Right Arm, Patient Position: Sitting, Cuff Size: Normal)   Pulse 75   Temp 98.3 F (36.8 C) (Oral)   Wt 296 lb 9.6 oz (134.5 kg)   BMI 39.13 kg/m   Body mass index is 39.13 kg/m.  GENERAL: vitals reviewed and listed above, alert, oriented, appears well hydrated and in no acute distress HEENT: atraumatic, conjunctiva  clear, no obvious abnormalities on inspection of external nose and ears MS: moves all extremities  LLE  With larger area   of redness with demarcation  Left medial leg with  excoriated area medial above ankle   Warm  No abscess   Or boil    Nl rom joint  Gait not effected  Slight edema and  chronic skin changes PSYCH: pleasant and cooperative, no obvious depression or anxiety Wt Readings from Last 3 Encounters:  02/11/18 296 lb 9.6 oz (134.5 kg)  02/09/18 296 lb (134.3 kg)  01/02/18 (!) 301 lb 3.2 oz (136.6 kg)    ASSESSMENT AND PLAN:  Discussed the following assessment and plan:  Cellulitis of left lower extremity  Chronic anticoagulation  Type 2 diabetes mellitus with other circulatory complications (HCC) - controlled   one dose 600 mg clinda   prob helped some  Disc options  Doxy and local care and fu .    Do not think  Doppler is needed based on exam and the fact that   Is on anticoagulation and  Neg in past .  close fu advised and  if not improving as expected  -Patient advised to return or notify health care team  if symptoms worsen ,persist or new  concerns arise.  Patient Instructions  I agree  This is cellulitis . And  Agree with antibiotic.   Doxycycline.    Local care    .  expect significant improvment in the next 48 hours.  Elevate as possibel      Cellulitis, Adult Cellulitis is a skin infection. The infected area is usually red and tender. This condition occurs most often in the arms and lower legs. The infection can travel to the muscles, blood, and underlying tissue and become serious. It is very important to get treated for this condition. What are the causes? Cellulitis is caused by bacteria. The bacteria enter through a break in the skin, such as a cut, burn, insect bite, open sore, or crack. What increases the risk? This condition is more likely to occur in people who:  Have a weak defense system (immune system).  Have open wounds on the skin such as cuts, burns, bites, and scrapes. Bacteria can enter the body through these open wounds.  Are older.  Have diabetes.  Have a type of long-lasting (chronic) liver disease (cirrhosis) or kidney disease.  Use IV drugs.  What are the signs or symptoms? Symptoms of this condition include:  Redness, streaking, or spotting on the skin.  Swollen area of the skin.  Tenderness or pain when an area of the skin is touched.  Warm skin.  Fever.  Chills.  Blisters.  How is this diagnosed? This condition is diagnosed based on a medical history and physical exam. You may also have tests, including:  Blood tests.  Lab tests.  Imaging tests.  How is this  treated? Treatment for this condition may include:  Medicines, such as antibiotic medicines or antihistamines.  Supportive care, such as rest and application of cold or warm cloths (cold or warm compresses) to the skin.  Hospital care, if the condition is severe.  The infection usually gets better within 1-2 days of treatment. Follow these instructions at home:  Take over-the-counter and prescription  medicines only as told by your health care provider.  If you were prescribed an antibiotic medicine, take it as told by your health care provider. Do not stop taking the antibiotic even if you start to feel better.  Drink enough fluid to keep your urine clear or pale yellow.  Do not touch or rub the infected area.  Raise (elevate) the infected area above the level of your heart while you are sitting or lying down.  Apply warm or cold compresses to the affected area as told by your health care provider.  Keep all follow-up visits as told by your health care provider. This is important. These visits let your health care provider make sure a more serious infection is not developing. Contact a health care provider if:  You have a fever.  Your symptoms do not improve within 1-2 days of starting treatment.  Your bone or joint underneath the infected area becomes painful after the skin has healed.  Your infection returns in the same area or another area.  You notice a swollen bump in the infected area.  You develop new symptoms.  You have a general ill feeling (malaise) with muscle aches and pains. Get help right away if:  Your symptoms get worse.  You feel very sleepy.  You develop vomiting or diarrhea that persists.  You notice red streaks coming from the infected area.  Your red area gets larger or turns dark in color. This information is not intended to replace advice given to you by your health care provider. Make sure you discuss any questions you have with your health care provider. Document Released: 09/25/2005 Document Revised: 04/25/2016 Document Reviewed: 10/25/2015 Elsevier Interactive Patient Education  2018 [place]. Dontel Harshberger M.D.

## 2018-02-13 ENCOUNTER — Encounter: Payer: Self-pay | Admitting: Internal Medicine

## 2018-02-13 ENCOUNTER — Ambulatory Visit (INDEPENDENT_AMBULATORY_CARE_PROVIDER_SITE_OTHER): Payer: Medicare Other | Admitting: Internal Medicine

## 2018-02-13 ENCOUNTER — Ambulatory Visit (HOSPITAL_COMMUNITY)
Admission: RE | Admit: 2018-02-13 | Discharge: 2018-02-13 | Disposition: A | Discharge status: Home / Self Care | Payer: Medicare Other | Source: Ambulatory Visit | Attending: Internal Medicine | Admitting: Internal Medicine

## 2018-02-13 VITALS — BP 108/70 | HR 66 | Temp 98.0°F | Wt 294.8 lb

## 2018-02-13 DIAGNOSIS — M7989 Other specified soft tissue disorders: Secondary | ICD-10-CM | POA: Diagnosis not present

## 2018-02-13 DIAGNOSIS — L03116 Cellulitis of left lower limb: Secondary | ICD-10-CM

## 2018-02-13 DIAGNOSIS — M79605 Pain in left leg: Secondary | ICD-10-CM | POA: Diagnosis not present

## 2018-02-13 NOTE — Patient Instructions (Signed)
Get doppler      Today to be sure .    No clot    If no clot  Then   I would continue  Same medication for now.

## 2018-02-13 NOTE — Progress Notes (Signed)
*  Preliminary Results* Left lower extremity venous duplex completed. Left lower extremity is negative for deep vein thrombosis. There is no evidence of left Baker's cyst.  02/13/2018 5:17 PM Maudry Mayhew, BS, RVT, RDCS, RDMS

## 2018-02-13 NOTE — Progress Notes (Signed)
Chief Complaint  Patient presents with  . Cellulitis    Left calf is now darker red/purple in color. Not much heat. Pt states heat comes and goes. Redness seems to have shifted toward back of calf. Area in left groin looks to be some improved. No fever.     HPI: [name] [name]. 67 y.o.   Here for fu has had 5 doses of meds  No trauma  Swelling dec in am inc in pm .  ? Not better  No new  Sx  Area  Purplish  And thigh area less tender   No weeping and no fever    Concern about asymmetrical swelling .    Ded with elevation  ROS: See pertinent positives and negatives per HPI. No fever chills tolerating med   Past Medical History:  Diagnosis Date  . Atrial fibrillation ([place]) 03/22/2009   a. s/p multiple DCCV; b. no coumadin due to low TE risk profile; c. Tikosyn Rx  . Coronary atherosclerosis of native coronary artery 11/2002   a. s/p stent to LAD 12/03; OM2 occluded at cath 12/03; d. myoview 5/10: no ischemia;  e. echo 7/11: EF 55%, BAE, mild RVE, PASP 41-45; Myoview was in March 2013. There was no ischemia or infarction, EF 51%   . Cutaneous abscess of back excluding buttocks 07/04/2014   Appears to stem from possibly a cyst very large area 6 cm contact surgeon office   . Diabetes mellitus without complication ([place])   . Drusen body    see opth note  . ERECTILE DYSFUNCTION 03/22/2009  . GERD 03/22/2009  . HYPERGLYCEMIA 04/25/2010  . HYPERLIPIDEMIA 03/22/2009  . 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 LATERAL EPICONDYLITIS, LEFT 10/24/2009  . LIVER FUNCTION TESTS, ABNORMAL 04/25/2010  . Local reaction to immunization 05/05/2012   minor resolving  zostavax   . Myocardial infarction ([place]) mi2003  . Numbness in left leg    foot related to back disease and surgery  . Obesity, unspecified 04/24/2009  . Perforated appendicitis with necrosis s/p open appendectomy 06/07/14 06/04/2014  . Renal cyst    Characterized by MRI as simple  . Ruptured suppurative  appendicitis    2015   . SLEEP APNEA, OBSTRUCTIVE 03/22/2009   compliant with CPAP  . THROMBOCYTOPENIA 08/16/2010  . TOBACCO USE, QUIT 10/24/2009  . 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 Umbilical hernia     Family History  Problem Relation Age of Onset  . Thyroid disease Mother   . Ovarian cancer Mother   . Breast cancer Mother   . Lung cancer Father   . Cancer Father     Social History   Socioeconomic History  . Marital status: Married    Spouse name: None  . Number of children: None  . Years of education: None  . Highest education level: None  Social Needs  . Financial resource strain: None  . Food insecurity - worry: None  . Food insecurity - inability: None  . Transportation needs - medical: None  . Transportation needs - non-medical: None  Occupational History  . None  Tobacco Use  . Smoking status: Former Smoker    Packs/day: 2.00    Years: 42.00    Pack years: 84.00    Types: Cigarettes    Last attempt to quit: 12/30/2008    Years since quitting: 9.1  . Smokeless tobacco: Never Used  . Tobacco comment: started at age 45;  1-2 ppd; quit 2010  Substance and Sexual Activity  . Alcohol use: No    Alcohol/week: 0.0 oz    Comment: rarely; maybe 1 beer a year  . Drug use: No  . Sexual activity: Yes  Other Topics Concern  . None  Social History Narrative   Retired paramedic   Regular exercise-yes not recently    Has children   Wife is overweight and has fibromyalgia and depression on disability doesn't go out much. Back surgery    Has older dog   Retired from [employer] 31 years of service now 7 years     Outpatient Medications Prior to Visit  Medication Sig Dispense Refill  . acetaminophen (TYLENOL) 325 MG tablet Take 650 mg by mouth every 6 (six) hours as needed for mild pain.    [name] albuterol (PROVENTIL HFA;VENTOLIN HFA) 108 (90 Base) MCG/ACT inhaler Inhale 2 puffs into the lungs every 6 (six) hours as needed for wheezing or shortness of breath. 1 Inhaler 0  .  atorvastatin (LIPITOR) 80 MG tablet TAKE 1 TABLET BY MOUTH  DAILY 90 tablet 1  . diphenhydrAMINE (BENADRYL) 25 MG tablet Take 25 mg by mouth at bedtime as needed for sleep.    [name] dofetilide (TIKOSYN) 500 MCG capsule TAKE 1 CAPSULE BY MOUTH TWO TIMES DAILY 180 capsule 1  . doxycycline (VIBRAMYCIN) 100 MG capsule Take 1 capsule (100 mg total) by mouth 2 (two) times daily. 20 capsule 0  . famotidine (PEPCID) 10 MG tablet Take 10 mg by mouth daily.    . fish oil-omega-3 fatty acids 1000 MG capsule Take 1 g by mouth 2 (two) times daily.     . fluticasone furoate-vilanterol (BREO ELLIPTA) 200-25 MCG/INH AEPB Inhale 1 puff into the lungs daily. 1 each 2  . KLOR-CON M20 20 MEQ tablet TAKE 1 TABLET BY MOUTH EVERY DAY 90 tablet 2  . Magnesium Oxide 500 MG TABS Take 750 mg by mouth daily.    . metFORMIN (GLUCOPHAGE-XR) 500 MG 24 hr tablet TAKE 3 TABLETS BY MOUTH  DAILY WITH BREAKFAST 270 tablet 1  . MULTIPLE VITAMIN PO Take 1 tablet by mouth every evening.     . nitroGLYCERIN (NITROSTAT) 0.4 MG SL tablet Place 1 tablet (0.4 mg total) under the tongue every 5 (five) minutes as needed for chest pain. 25 tablet 12  . ONETOUCH VERIO test strip USE TWICE A DAY 50 each 12  . pantoprazole (PROTONIX) 40 MG tablet TAKE 1 TABLET (40 MG TOTAL) BY MOUTH DAILY. (Patient taking differently: TAKE 1 TABLET (40 MG TOTAL) BY MOUTH DAILY PRN) 90 tablet 1  . PRADAXA 150 MG CAPS capsule TAKE 1 CAPSULE BY MOUTH  EVERY 12 HOURS 180 capsule 1  . Suvorexant (BELSOMRA) 20 MG TABS Take 1 tablet by mouth at bedtime. 90 tablet 1   Facility-Administered Medications Prior to Visit  Medication Dose Route Frequency Provider Last Rate Last Dose  . ipratropium-albuterol (DUONEB) 0.5-2.5 (3) MG/3ML nebulizer solution 3 mL  3 mL Nebulization Q6H Panosh, Standley Brooking, MD   3 mL at 01/02/18 1545     EXAM:  BP 108/70 (BP Location: Right Wrist, Patient Position: Sitting, Cuff Size: Normal)   Pulse 66   Temp 98 F (36.7 C) (Oral)   Wt 294 lb  12.8 oz (133.7 kg)   BMI 38.89 kg/m   Body mass index is 38.89 kg/m.  GENERAL: vitals reviewed and listed above, alert, oriented, appears well hydrated and in no acute distress HEENT: atraumatic, conjunctiva  clear, no obvious abnormalities on inspection of external nose and ears  lle   less angry red   And  More purplish color  More diffuse  And less warmth    area on thigh less red  Non palpable slight tenderness  lle still has edema  Compared to left   Good rom and  Neg homans   PSYCH: pleasant and cooperative, no obvious depression or anxiety Wt Readings from Last 3 Encounters:  02/13/18 294 lb 12.8 oz (133.7 kg)  02/11/18 296 lb 9.6 oz (134.5 kg)  02/09/18 296 lb (134.3 kg)    ASSESSMENT AND PLAN:  Discussed the following assessment and plan:  Left leg swelling - Plan: VAS [place] LOWER EXTREMITY VENOUS (DVT)  Left leg pain - Plan: VAS [place] LOWER EXTREMITY VENOUS (DVT)  Cellulitis of left lower extremity - Plan: VAS [place] LOWER EXTREMITY VENOUS (DVT) Atypical     Less angry but does have    More edema than I would have expected  He does have some chronic changes   No ulcer     Call report to my cell.   Still think getting some better  But skin changes  Are  Atypical .     But could be from anticoagulation  And swelling  Contact next week   About how doing otherwise elevate this weekend -Patient advised to return or notify health care team  if symptoms worsen ,persist or new concerns arise.  Patient Instructions  Get doppler      Today to be sure .    No clot    If no clot  Then   I would continue  Same medication for now.     Standley Brooking. Panosh M.D.

## 2018-02-16 ENCOUNTER — Encounter: Payer: Self-pay | Admitting: Internal Medicine

## 2018-02-18 ENCOUNTER — Ambulatory Visit: Payer: Medicare Other | Admitting: Pulmonary Disease

## 2018-02-18 ENCOUNTER — Inpatient Hospital Stay: Admission: RE | Admit: 2018-02-18 | Payer: Medicare Other | Source: Ambulatory Visit

## 2018-02-25 ENCOUNTER — Ambulatory Visit (INDEPENDENT_AMBULATORY_CARE_PROVIDER_SITE_OTHER)
Admission: RE | Admit: 2018-02-25 | Discharge: 2018-02-25 | Disposition: A | Discharge status: Home / Self Care | Payer: Medicare Other | Source: Ambulatory Visit | Attending: Pulmonary Disease | Admitting: Pulmonary Disease

## 2018-02-25 DIAGNOSIS — J432 Centrilobular emphysema: Secondary | ICD-10-CM | POA: Diagnosis not present

## 2018-03-03 ENCOUNTER — Telehealth: Payer: Self-pay | Admitting: Pulmonary Disease

## 2018-03-03 DIAGNOSIS — J438 Other emphysema: Secondary | ICD-10-CM

## 2018-03-03 NOTE — Telephone Encounter (Signed)
HRCT chest 02/25/18 >> atherosclerosis, enlarged PA, centrilobular and paraseptal emphysema, patchy GGO, mild traction BTX, 3 mm nodule RML   Discussed results with pt.  Will schedule A1AT testing.  He had PFT and ROV on 03/25/18.

## 2018-03-14 ENCOUNTER — Other Ambulatory Visit: Payer: Self-pay | Admitting: Cardiology

## 2018-03-25 ENCOUNTER — Other Ambulatory Visit: Payer: Medicare Other

## 2018-03-25 ENCOUNTER — Ambulatory Visit (INDEPENDENT_AMBULATORY_CARE_PROVIDER_SITE_OTHER): Payer: Medicare Other | Admitting: Pulmonary Disease

## 2018-03-25 ENCOUNTER — Encounter: Payer: Self-pay | Admitting: Pulmonary Disease

## 2018-03-25 VITALS — BP 130/72 | HR 58 | Ht 71.0 in | Wt 301.0 lb

## 2018-03-25 DIAGNOSIS — J432 Centrilobular emphysema: Secondary | ICD-10-CM

## 2018-03-25 DIAGNOSIS — G4733 Obstructive sleep apnea (adult) (pediatric): Secondary | ICD-10-CM | POA: Diagnosis not present

## 2018-03-25 DIAGNOSIS — J438 Other emphysema: Secondary | ICD-10-CM

## 2018-03-25 DIAGNOSIS — R918 Other nonspecific abnormal finding of lung field: Secondary | ICD-10-CM | POA: Diagnosis not present

## 2018-03-25 LAB — PULMONARY FUNCTION TEST
DL/VA % pred: 56 %
DL/VA: 2.61 ml/min/mmHg/L
DLCO unc % pred: 46 %
DLCO unc: 15.63 ml/min/mmHg
FEF 25-75 Pre: 2.97 L/sec
FEF2575-%Pred-Pre: 111 %
FEV1-%Pred-Pre: 85 %
FEV1-Pre: 2.93 L
FEV1FVC-%Pred-Pre: 109 %
FEV6-%Pred-Pre: 82 %
FEV6-Pre: 3.62 L
FEV6FVC-%Pred-Pre: 105 %
FVC-%Pred-Pre: 77 %
FVC-Pre: 3.62 L
Pre FEV1/FVC ratio: 81 %
Pre FEV6/FVC Ratio: 100 %
RV % pred: 73 %
RV: 1.81 L
TLC % pred: 80 %
TLC: 5.84 L

## 2018-03-25 NOTE — Progress Notes (Signed)
PFT completed today 03/25/18

## 2018-03-25 NOTE — Progress Notes (Signed)
[facility]  Chief Complaint  Patient presents with  . Follow-up    says breathing is doing much better. no problems with CPAP likes new mask    Vital signs: BP 130/72 (BP Location: Right Arm, Cuff Size: Normal)   Pulse (!) 58   Ht 5\' 11"  (1.803 m)   Wt (!) 301 lb (136.5 kg)   SpO2 95%   BMI 41.98 kg/m   History of Present Illness: [name] [name]. is a 67 y.o. male with OSA and emphysema.  He had CT chest.  Showed emphysema and lung nodule.  His PFT showed moderate to severe diffusion defect, but no obstruction on spirometry.  He does not have cough, wheeze, sputum, hemoptysis, or dyspnea.  He hasn't been using inhalers.  He didn't get A1AT testing yet.  He had an uncle who need oxygen in his 42s.  He uses CPAP nightly.  New mask works better.  Feels this helps, and no issues with pressure settings.  He tends to fall asleep in recliner while watching TV, and then eventually gets up and goes to bed.   Physical Exam:  General - pleasant Eyes - pupils reactive ENT - no sinus tenderness, no oral exudate, no LAN Cardiac - regular, no murmur Chest - no wheeze, rales Abd - soft, non tender Ext - no edema Skin - no rashes Neuro - normal strength Psych - normal mood  Assessment/Plan:  Emphysema. - not much regarding symptoms - check A1AT level - defer inhaler therapy for now  Lung nodule. - will arrange for follow up CT chest w/o contrast for February 2020  Obstructive sleep apnea. - he is compliant with CPAP and reports benefit - continue auto CPAP - encouraged him to use CPAP for entire time he is asleep - if he needs high pressures at some point, then he might need to change to Bipap  Insomnia. - belsomra through PCP   Patient Instructions  Lab test today  Will schedule CT chest for February 2020 and follow up after that      Chesley Mires, MD [place] 03/25/2018, 3:47 PM Pager:   [phone]  Flow Sheet  Pulmonary tests: HRCT chest 02/25/18 >> atherosclerosis, enlarged PA, centrilobular and paraseptal emphysema, patchy GGO, mild traction BTX, 3 mm nodule RML PFT 03/25/18 >> FEV1 2.93 (85%),FEV1% 81, TLC 5.84 (80%), DLCO 46%  Sleep tests: PSG 12/26/02 >> AHI 25 Auto CPAP 02/23/18 to 03/24/18 >> used on 30 of 30 nights with average 4 hrs 30 min.  Average AHI 14.2 with median CPAP 14 and 95 th percentile CPAP 18 cm H2O  Cardiac tests: Echo 06/06/16 >> EF 55 to 60%, grade 1 DD  Review of Systems: Constitutional: Negative for fever and unexpected weight change.  HENT: Negative for congestion, dental problem, ear pain, nosebleeds, postnasal drip, rhinorrhea, sneezing and trouble swallowing.   Eyes: Negative for redness and itching.  Respiratory: Negative for cough, chest tightness, shortness of breath and wheezing.   Cardiovascular: Negative for palpitations and leg swelling.  Gastrointestinal: Negative for nausea and vomiting.  Genitourinary: Negative for dysuria.  Musculoskeletal: Negative for joint swelling.  Skin: Negative for rash.  Allergic/Immunologic: Positive for environmental allergies. Negative for food allergies and immunocompromised state.  Neurological: Negative for headaches.  Hematological: Bruises/bleeds easily.  Psychiatric/Behavioral: Negative for dysphoric mood. The patient is not nervous/anxious.    Past Medical History: He  has a past medical history of Atrial fibrillation ([place]) (03/22/2009), Coronary  atherosclerosis of native coronary artery (11/2002), Cutaneous abscess of back excluding buttocks (07/04/2014), Diabetes mellitus without complication ([place]), Drusen body, ERECTILE DYSFUNCTION (03/22/2009), GERD (03/22/2009), HYPERGLYCEMIA (04/25/2010), HYPERLIPIDEMIA (03/22/2009), Iliac aneurysm ([place]), LATERAL EPICONDYLITIS, LEFT (10/24/2009), LIVER FUNCTION TESTS, ABNORMAL (04/25/2010), Local reaction to immunization (05/05/2012), Myocardial infarction ([place])  (mi2003), Numbness in left leg, Obesity, unspecified (04/24/2009), Perforated appendicitis with necrosis s/p open appendectomy 06/07/14 (06/04/2014), Renal cyst, Ruptured suppurative appendicitis, SLEEP APNEA, OBSTRUCTIVE (03/22/2009), THROMBOCYTOPENIA (08/16/2010), TOBACCO USE, QUIT (10/24/2009), ULNAR NEUROPATHY, LEFT (0/73/7106), and Umbilical hernia.  Past Surgical History: He  has a past surgical history that includes cyst on epiglottis (08/2002); surgery l4-l5  (1998); stent; ulnar neuropathy; Umbilical hernia repair; Lumbar disc surgery; Esophagogastroduodenoscopy (11/15/2011); Colonoscopy (11/15/2011); laparoscopic appendectomy (N/A, 06/06/2014); Colon surgery; Appendectomy (N/A, 06/06/2014); and Colonoscopy with propofol (N/A, 01/03/2017).  Family History: His family history includes Breast cancer in his mother; Cancer in his father; Lung cancer in his father; Ovarian cancer in his mother; Thyroid disease in his mother.  Social History: He  reports that he quit smoking about 9 years ago. His smoking use included cigarettes. He has a 84.00 pack-year smoking history. He has never used smokeless tobacco. He reports that he does not drink alcohol or use drugs.  Medications: Allergies as of 03/25/2018      Reactions   Penicillins    Childhood reaction-details unknown   Procaine Hcl Hives, Other (See Comments)   Red in the face and neck.  First generation in 1950s, but after that, I have been able to tolerate novacaine and lidocaine.   Pseudoephedrine Other (See Comments)   Patient went into afib   Sulfonamide Derivatives    Childhood reaction    Cardizem [diltiazem] Rash      Medication List        Accurate as of 03/25/18  3:47 PM. Always use your most recent med list.          acetaminophen 325 MG tablet Commonly known as:  TYLENOL Take 650 mg by mouth every 6 (six) hours as needed for mild pain.   albuterol 108 (90 Base) MCG/ACT inhaler Commonly known as:  PROVENTIL HFA;VENTOLIN  HFA Inhale 2 puffs into the lungs every 6 (six) hours as needed for wheezing or shortness of breath.   atorvastatin 80 MG tablet Commonly known as:  LIPITOR TAKE 1 TABLET BY MOUTH  DAILY   diphenhydrAMINE 25 MG tablet Commonly known as:  BENADRYL Take 25 mg by mouth at bedtime as needed for sleep.   dofetilide 500 MCG capsule Commonly known as:  TIKOSYN TAKE 1 CAPSULE BY MOUTH TWO TIMES DAILY   famotidine 10 MG tablet Commonly known as:  PEPCID Take 10 mg by mouth daily.   fish oil-omega-3 fatty acids 1000 MG capsule Take 1 g by mouth 2 (two) times daily.   KLOR-CON M20 20 MEQ tablet Generic drug:  potassium chloride SA TAKE 1 TABLET BY MOUTH EVERY DAY   Magnesium Oxide 500 MG Tabs Take 750 mg by mouth daily.   metFORMIN 500 MG 24 hr tablet Commonly known as:  GLUCOPHAGE-XR TAKE 3 TABLETS BY MOUTH  DAILY WITH BREAKFAST   MULTIPLE VITAMIN PO Take 1 tablet by mouth every evening.   nitroGLYCERIN 0.4 MG SL tablet Commonly known as:  NITROSTAT Place 1 tablet (0.4 mg total) under the tongue every 5 (five) minutes as needed for chest pain.   ONETOUCH VERIO test strip Generic drug:  glucose blood USE TWICE A DAY   pantoprazole 40 MG tablet Commonly  known as:  PROTONIX TAKE 1 TABLET (40 MG TOTAL) BY MOUTH DAILY.   PRADAXA 150 MG Caps capsule Generic drug:  dabigatran TAKE 1 CAPSULE BY MOUTH  EVERY 12 HOURS   Suvorexant 20 MG Tabs Commonly known as:  BELSOMRA Take 1 tablet by mouth at bedtime.

## 2018-03-25 NOTE — Patient Instructions (Signed)
Lab test today  Will schedule CT chest for February 2020 and follow up after that

## 2018-03-31 LAB — ALPHA-1 ANTITRYPSIN PHENOTYPE: A-1 Antitrypsin, Ser: 139 mg/dL (ref 83–199)

## 2018-04-01 ENCOUNTER — Telehealth: Payer: Self-pay | Admitting: Pulmonary Disease

## 2018-04-01 NOTE — Telephone Encounter (Signed)
Pt is calling back   [phone]

## 2018-04-01 NOTE — Telephone Encounter (Signed)
LMOM will call back. 

## 2018-04-01 NOTE — Telephone Encounter (Signed)
Called and spoke with patient. Advised him of results. Patient verbalized understanding. Nothing further needed at this time.

## 2018-04-01 NOTE — Telephone Encounter (Signed)
A1AT 03/25/18 >> 139, MM    Please let him know that his lab test was normal.  He does not have inherited form of emphysema.  No change to current management.

## 2018-04-08 ENCOUNTER — Ambulatory Visit (INDEPENDENT_AMBULATORY_CARE_PROVIDER_SITE_OTHER): Payer: Medicare Other | Admitting: Orthopaedic Surgery

## 2018-04-08 ENCOUNTER — Ambulatory Visit (INDEPENDENT_AMBULATORY_CARE_PROVIDER_SITE_OTHER): Payer: Medicare Other

## 2018-04-08 ENCOUNTER — Encounter (INDEPENDENT_AMBULATORY_CARE_PROVIDER_SITE_OTHER): Payer: Self-pay | Admitting: Orthopaedic Surgery

## 2018-04-08 DIAGNOSIS — G8929 Other chronic pain: Secondary | ICD-10-CM

## 2018-04-08 DIAGNOSIS — M25511 Pain in right shoulder: Secondary | ICD-10-CM

## 2018-04-08 MED ORDER — TRAMADOL HCL 50 MG PO TABS: 50.0000 mg | ORAL_TABLET | Freq: Three times a day (TID) | ORAL | 0 refills | Status: DC | PRN

## 2018-04-08 MED ORDER — METHYLPREDNISOLONE ACETATE 40 MG/ML IJ SUSP
40.0000 mg | INTRAMUSCULAR | Status: AC | PRN
  Administered 2018-04-08: 40 mg via INTRA_ARTICULAR

## 2018-04-08 MED ORDER — LIDOCAINE HCL 1 % IJ SOLN
3.0000 mL | INTRAMUSCULAR | Status: AC | PRN
  Administered 2018-04-08: 3 mL

## 2018-04-08 NOTE — Progress Notes (Signed)
Office Visit Note   Patient: [name] [name].           Date of Birth: [birth year]           MRN: [id number] Visit Date: 04/08/2018              Requested by: Burnis Medin, MD [place], [place] [id number] PCP: Burnis Medin, MD   Assessment & Plan: Visit Diagnoses:  1. Chronic right shoulder pain     Plan: I do feel that he would benefit from combination of some tramadol and anti-inflammatories as well as a steroid injection in the subacromial space of his right shoulder.  I explained the risk and benefits of injections and he agreed with this treatment plan.  He tolerated the injection well.  We will see him back in about 3 weeks see how is doing overall.  If he continues to have weakness in the shoulder we would recommend an MRI of the right shoulder to rule out a rotator cuff tear.  Follow-Up Instructions: Return in about 3 weeks (around 04/29/2018).   Orders:  Orders Placed This Encounter  Procedures  . Large Joint Inj  . XR Shoulder Right   Meds ordered this encounter  Medications  . traMADol (ULTRAM) 50 MG tablet    Sig: Take 1-2 tablets (50-100 mg total) by mouth 3 (three) times daily as needed.    Dispense:  60 tablet    Refill:  0      Procedures: Large Joint Inj: R subacromial bursa on 04/08/2018 2:06 PM Indications: pain and diagnostic evaluation Details: 22 G 1.5 in needle  Arthrogram: No  Medications: 3 mL lidocaine 1 %; 40 mg methylPREDNISolone acetate 40 MG/ML Outcome: tolerated well, no immediate complications Procedure, treatment alternatives, risks and benefits explained, specific risks discussed. Consent was given by the patient. Immediately prior to procedure a time out was called to verify the correct patient, procedure, equipment, support staff and site/side marked as required. Patient was prepped and draped in the usual sterile fashion.       Clinical Data: No additional findings.   Subjective: Chief Complaint    Patient presents with  . Right Shoulder - Pain  The patient is a very pleasant right-hand-dominant 67 year old patient well-known to me.  He comes in with acute right shoulder pain after injuring his shoulder reaching in the backseat last week to get a hat.  Afterwards he had a difficult time for flexion shoulder lifting his arm up.  He did feel like there was a pop in his shoulder.  He does report he does have chronic changes and grinding in the shoulder from old football injuries.  He denies any neck pain denies any numbness tingling in his right hand.  He currently denies any headache, chest pain, shortness of breath, fever, chills, nausea, vomiting.  HPI  Review of Systems He currently denies any systemic illnesses  Objective: Vital Signs: There were no vitals taken for this visit.  Physical Exam Examination shows that he is alert and oriented x3 in no acute distress Ortho Exam Examination of the right shoulder shows fluid and full range of motion.  There is just some slight weakness in the rotator cuff with abduction and external rotation.  His liftoff is negative.  He does have significant grinding in the shoulder with positive Neer and Hawkins signs. Specialty Comments:  No specialty comments available.  Imaging: Xr Shoulder Right  Result Date: 04/08/2018 3 views of  the right shoulder show well located shoulder.  There is calcifications in the subacromial outlet but good subacromial space.  The glenohumeral joint is well located with no significant arthritic findings.  There is mild acromioclavicular joint narrowing.    PMFS History: Patient Active Problem List   Diagnosis Date Noted  . Acute back pain with sciatica, right 09/04/2017  . Right hip pain 09/04/2017  . Thrombocytopenia ([place]) 06/25/2016  . Atrial fibrillation ([place]) 01/01/2016  . Iliac aneurysm ([place])   . Morbid obesity ([place]) 07/08/2014  . Type 2 diabetes mellitus with other circulatory complications ([place])  19/14/7829  . Chronic anticoagulation 12/21/2013  . Sleep disorder 06/21/2013  . Decreased hearing 08/06/2012  . Hemorrhoids 08/06/2012  . High risk medication use 05/05/2012  . Anticoagulation management encounter 05/05/2012  . Retinal tear 04/29/2012  . Personal history of colonic polyps 05/05/2011  . Visit for preventive health examination 05/05/2011  . PALPITATIONS 08/16/2010  . LIVER FUNCTION TESTS, ABNORMAL 04/25/2010  . Lateral epicondylitis 10/24/2009  . TOBACCO USE, QUIT 10/24/2009  . OBESITY, UNSPECIFIED 04/24/2009  . Hyperlipidemia 03/22/2009  . ERECTILE DYSFUNCTION 03/22/2009  . Obstructive sleep apnea 03/22/2009  . 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 MYOCARDIAL INFARCTION, HX OF 03/22/2009  . Coronary atherosclerosis 03/22/2009  . Atrial fibrillation with RVR ([place]) 03/22/2009  . GERD 03/22/2009   Past Medical History:  Diagnosis Date  . Atrial fibrillation ([place]) 03/22/2009   a. s/p multiple DCCV; b. no coumadin due to low TE risk profile; c. Tikosyn Rx  . Coronary atherosclerosis of native coronary artery 11/2002   a. s/p stent to LAD 12/03; OM2 occluded at cath 12/03; d. myoview 5/10: no ischemia;  e. echo 7/11: EF 55%, BAE, mild RVE, PASP 41-45; Myoview was in March 2013. There was no ischemia or infarction, EF 51%   . Cutaneous abscess of back excluding buttocks 07/04/2014   Appears to stem from possibly a cyst very large area 6 cm contact surgeon office   . Diabetes mellitus without complication ([place])   . Drusen body    see opth note  . ERECTILE DYSFUNCTION 03/22/2009  . GERD 03/22/2009  . HYPERGLYCEMIA 04/25/2010  . HYPERLIPIDEMIA 03/22/2009  . 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 LATERAL EPICONDYLITIS, LEFT 10/24/2009  . LIVER FUNCTION TESTS, ABNORMAL 04/25/2010  . Local reaction to immunization 05/05/2012   minor resolving  zostavax   . Myocardial infarction ([place]) mi2003  . Numbness in left leg    foot related to back  disease and surgery  . Obesity, unspecified 04/24/2009  . Perforated appendicitis with necrosis s/p open appendectomy 06/07/14 06/04/2014  . Renal cyst    Characterized by MRI as simple  . Ruptured suppurative appendicitis    2015   . SLEEP APNEA, OBSTRUCTIVE 03/22/2009   compliant with CPAP  . THROMBOCYTOPENIA 08/16/2010  . TOBACCO USE, QUIT 10/24/2009  . 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 Umbilical hernia     Family History  Problem Relation Age of Onset  . Thyroid disease Mother   . Ovarian cancer Mother   . Breast cancer Mother   . Lung cancer Father   . Cancer Father     Past Surgical History:  Procedure Laterality Date  . APPENDECTOMY N/A 06/06/2014   Procedure: APPENDECTOMY;  Surgeon: Odis Hollingshead, MD;  Location: [facility];  Service: General;  Laterality: N/A;  . COLON SURGERY    . COLONOSCOPY  11/15/2011   Procedure: COLONOSCOPY;  Surgeon: Juanita Craver, MD;  Location: [facility];  Service: Endoscopy;  Laterality: N/A;  . COLONOSCOPY WITH PROPOFOL N/A 01/03/2017   Procedure: COLONOSCOPY WITH PROPOFOL;  Surgeon: Carol Ada, MD;  Location: [facility];  Service: Endoscopy;  Laterality: N/A;  . cyst on epiglottis  08/2002  . ESOPHAGOGASTRODUODENOSCOPY  11/15/2011   Procedure: ESOPHAGOGASTRODUODENOSCOPY (EGD);  Surgeon: Juanita Craver, MD;  Location: [facility];  Service: Endoscopy;  Laterality: N/A;  . LAPAROSCOPIC APPENDECTOMY N/A 06/06/2014   Procedure: APPENDECTOMY LAPAROSCOPIC attemted;  Surgeon: Odis Hollingshead, MD;  Location: [facility];  Service: General;  Laterality: N/A;  . LUMBAR DISC SURGERY     two  holes in spinalcovering  . stent     LAD DUS 2004  . surgery l4-l5   1998   ruptured x 3  . ulnar neuropathy    . UMBILICAL HERNIA REPAIR     mesh   Social History   Occupational History  . Not on file  Tobacco Use  . Smoking status: Former Smoker    Packs/day: 2.00    Years: 42.00    Pack years: 84.00    Types: Cigarettes    Last attempt to quit: 12/30/2008     Years since quitting: 9.2  . Smokeless tobacco: Never Used  . Tobacco comment: started at age 86; 1-2 ppd; quit 2010  Substance and Sexual Activity  . Alcohol use: No    Alcohol/week: 0.0 oz    Comment: rarely; maybe 1 beer a year  . Drug use: No  . Sexual activity: Yes

## 2018-04-13 ENCOUNTER — Ambulatory Visit (INDEPENDENT_AMBULATORY_CARE_PROVIDER_SITE_OTHER): Payer: Medicare Other | Admitting: Internal Medicine

## 2018-04-13 ENCOUNTER — Encounter: Payer: Self-pay | Admitting: Internal Medicine

## 2018-04-13 VITALS — BP 120/80 | HR 48 | Temp 97.6°F | Wt 293.2 lb

## 2018-04-13 DIAGNOSIS — Z79899 Other long term (current) drug therapy: Secondary | ICD-10-CM

## 2018-04-13 DIAGNOSIS — Z7901 Long term (current) use of anticoagulants: Secondary | ICD-10-CM

## 2018-04-13 DIAGNOSIS — J3489 Other specified disorders of nose and nasal sinuses: Secondary | ICD-10-CM | POA: Diagnosis not present

## 2018-04-13 MED ORDER — FLUTICASONE PROPIONATE 50 MCG/ACT NA SUSP: 2.0000 | Freq: Every day | NASAL | 6 refills | Status: DC

## 2018-04-13 NOTE — Progress Notes (Signed)
Chief Complaint  Patient presents with  . Sinus Problem    Pt states that he has had a sinus HA for about 2 weeks or more. Pt has not taken any OTC for his symptoms. Denies fever.     HPI: [name] [name]. 67 y.o. come in for problem based visit   Hx of osa on cpap  And rechet eval for   resp ephhysemia  But dr Halford Chessman  Over past weeks  Feels like sinus pressrue ans swelling    No longer can take decongestant.  soasks what to take  Sinus  Pressure  As in past.     bgan and outdated nasacort from ? 2010? No cough bleeding syncope    Ear feels cloged left some  ROS: See pertinent positives and negatives per HPI. No cp sob  Leg selling is down   Past Medical History:  Diagnosis Date  . Atrial fibrillation ([place]) 03/22/2009   a. s/p multiple DCCV; b. no coumadin due to low TE risk profile; c. Tikosyn Rx  . Coronary atherosclerosis of native coronary artery 11/2002   a. s/p stent to LAD 12/03; OM2 occluded at cath 12/03; d. myoview 5/10: no ischemia;  e. echo 7/11: EF 55%, BAE, mild RVE, PASP 41-45; Myoview was in March 2013. There was no ischemia or infarction, EF 51%   . Cutaneous abscess of back excluding buttocks 07/04/2014   Appears to stem from possibly a cyst very large area 6 cm contact surgeon office   . Diabetes mellitus without complication ([place])   . Drusen body    see opth note  . ERECTILE DYSFUNCTION 03/22/2009  . GERD 03/22/2009  . HYPERGLYCEMIA 04/25/2010  . HYPERLIPIDEMIA 03/22/2009  . 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 LATERAL EPICONDYLITIS, LEFT 10/24/2009  . LIVER FUNCTION TESTS, ABNORMAL 04/25/2010  . Local reaction to immunization 05/05/2012   minor resolving  zostavax   . Myocardial infarction ([place]) mi2003  . Numbness in left leg    foot related to back disease and surgery  . Obesity, unspecified 04/24/2009  . Perforated appendicitis with necrosis s/p open appendectomy 06/07/14 06/04/2014  . Renal cyst    Characterized by MRI as  simple  . Ruptured suppurative appendicitis    2015   . SLEEP APNEA, OBSTRUCTIVE 03/22/2009   compliant with CPAP  . THROMBOCYTOPENIA 08/16/2010  . TOBACCO USE, QUIT 10/24/2009  . 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 Umbilical hernia     Family History  Problem Relation Age of Onset  . Thyroid disease Mother   . Ovarian cancer Mother   . Breast cancer Mother   . Lung cancer Father   . Cancer Father     Social History   Socioeconomic History  . Marital status: Married    Spouse name: Not on file  . Number of children: Not on file  . Years of education: Not on file  . Highest education level: Not on file  Occupational History  . Not on file  Social Needs  . Financial resource strain: Not on file  . Food insecurity:    Worry: Not on file    Inability: Not on file  . Transportation needs:    Medical: Not on file    Non-medical: Not on file  Tobacco Use  . Smoking status: Former Smoker    Packs/day: 2.00    Years: 42.00    Pack years: 84.00  Types: Cigarettes    Last attempt to quit: 12/30/2008    Years since quitting: 9.2  . Smokeless tobacco: Never Used  . Tobacco comment: started at age 63; 1-2 ppd; quit 2010  Substance and Sexual Activity  . Alcohol use: No    Alcohol/week: 0.0 oz    Comment: rarely; maybe 1 beer a year  . Drug use: No  . Sexual activity: Yes  Lifestyle  . Physical activity:    Days per week: Not on file    Minutes per session: Not on file  . Stress: Not on file  Relationships  . Social connections:    Talks on phone: Not on file    Gets together: Not on file    Attends religious service: Not on file    Active member of club or organization: Not on file    Attends meetings of clubs or organizations: Not on file    Relationship status: Not on file  Other Topics Concern  . Not on file  Social History Narrative   Retired paramedic   Regular exercise-yes not recently    Has children   Wife is overweight and has fibromyalgia and  depression on disability doesn't go out much. Back surgery    Has older dog   Retired from [employer] years of service now 7 years     Outpatient Medications Prior to Visit  Medication Sig Dispense Refill  . acetaminophen (TYLENOL) 325 MG tablet Take 650 mg by mouth every 6 (six) hours as needed for mild pain.    [name] albuterol (PROVENTIL HFA;VENTOLIN HFA) 108 (90 Base) MCG/ACT inhaler Inhale 2 puffs into the lungs every 6 (six) hours as needed for wheezing or shortness of breath. 1 Inhaler 0  . atorvastatin (LIPITOR) 80 MG tablet TAKE 1 TABLET BY MOUTH  DAILY 90 tablet 1  . diphenhydrAMINE (BENADRYL) 25 MG tablet Take 25 mg by mouth at bedtime as needed for sleep.    [name] dofetilide (TIKOSYN) 500 MCG capsule TAKE 1 CAPSULE BY MOUTH TWO TIMES DAILY 180 capsule 3  . famotidine (PEPCID) 10 MG tablet Take 10 mg by mouth daily.    . fish oil-omega-3 fatty acids 1000 MG capsule Take 1 g by mouth 2 (two) times daily.     [name] KLOR-CON M20 20 MEQ tablet TAKE 1 TABLET BY MOUTH EVERY DAY 90 tablet 2  . Magnesium Oxide 500 MG TABS Take 750 mg by mouth daily.    . metFORMIN (GLUCOPHAGE-XR) 500 MG 24 hr tablet TAKE 3 TABLETS BY MOUTH  DAILY WITH BREAKFAST 270 tablet 1  . MULTIPLE VITAMIN PO Take 1 tablet by mouth every evening.     . nitroGLYCERIN (NITROSTAT) 0.4 MG SL tablet Place 1 tablet (0.4 mg total) under the tongue every 5 (five) minutes as needed for chest pain. 25 tablet 12  . ONETOUCH VERIO test strip USE TWICE A DAY 50 each 12  . PRADAXA 150 MG CAPS capsule TAKE 1 CAPSULE BY MOUTH  EVERY 12 HOURS 180 capsule 1  . Suvorexant (BELSOMRA) 20 MG TABS Take 1 tablet by mouth at bedtime. 90 tablet 1  . traMADol (ULTRAM) 50 MG tablet Take 1-2 tablets (50-100 mg total) by mouth 3 (three) times daily as needed. 60 tablet 0  . pantoprazole (PROTONIX) 40 MG tablet TAKE 1 TABLET (40 MG TOTAL) BY MOUTH DAILY. (Patient not taking: Reported on 04/13/2018) 90 tablet 1   Facility-Administered Medications Prior to Visit    Medication Dose Route Frequency Provider  Last Rate Last Dose  . ipratropium-albuterol (DUONEB) 0.5-2.5 (3) MG/3ML nebulizer solution 3 mL  3 mL Nebulization Q6H Panosh, Standley Brooking, MD   3 mL at 01/02/18 1545     EXAM:  BP 120/80 (BP Location: Right Arm, Patient Position: Sitting, Cuff Size: Normal)   Pulse (!) 48   Temp 97.6 F (36.4 C) (Oral)   Wt 293 lb 3.2 oz (133 kg)   BMI 40.89 kg/m   Body mass index is 40.89 kg/m.  GENERAL: vitals reviewed and listed above, alert, oriented, appears well hydrated and in no acute distress HEENT: atraumatic, conjunctiva  clear, no obvious abnormalities on inspection of external nose and earstmc nad  Face non tender    OP : no lesion edema or exudate  NECK: no obvious masses on inspection palpation  LUNGS: clear to auscultation bilaterally, no wheezes, rales or rhonchi, good air movement CV: HRRR, no clubbing cyanosis or  Swelling dec has vv legs  nl cap refill  MS: moves all extremities without noticeable focal  abnormality PSYCH: pleasant and cooperative, no obvious depression or anxiety  BP Readings from Last 3 Encounters:  04/13/18 120/80  03/25/18 130/72  02/13/18 108/70    ASSESSMENT AND PLAN:  Discussed the following assessment and plan:  Sinus pressure  Chronic anticoagulation  Medication management Advise optimization of  Nasal steroid  Etc  Saline and INCS can use bid in the short run.  Can try plain  Generic allegra zyrtec or claritin without d  -Patient advised to return or notify health care team  if  new concerns arise.  Patient Instructions  Sinus pressure presumed allergy .       Nasal cortisone      Every day and can try  Twice a day   In the short run.   Tylenol .   Ok   If needed.       Standley Brooking. Panosh M.D.

## 2018-04-13 NOTE — Patient Instructions (Addendum)
Sinus pressure presumed allergy .       Nasal cortisone      Every day and can try  Twice a day   In the short run.   Tylenol .   Ok   If needed.

## 2018-04-19 ENCOUNTER — Emergency Department (HOSPITAL_COMMUNITY): Payer: Medicare Other

## 2018-04-19 ENCOUNTER — Encounter (HOSPITAL_COMMUNITY): Payer: Self-pay | Admitting: Emergency Medicine

## 2018-04-19 ENCOUNTER — Emergency Department (HOSPITAL_COMMUNITY)
Admission: EM | Admit: 2018-04-19 | Discharge: 2018-04-19 | Disposition: A | Discharge status: Home / Self Care | Payer: Medicare Other | Attending: Physician Assistant | Admitting: Physician Assistant

## 2018-04-19 DIAGNOSIS — Z79899 Other long term (current) drug therapy: Secondary | ICD-10-CM | POA: Diagnosis not present

## 2018-04-19 DIAGNOSIS — Z87891 Personal history of nicotine dependence: Secondary | ICD-10-CM | POA: Insufficient documentation

## 2018-04-19 DIAGNOSIS — E119 Type 2 diabetes mellitus without complications: Secondary | ICD-10-CM | POA: Diagnosis not present

## 2018-04-19 DIAGNOSIS — Z7902 Long term (current) use of antithrombotics/antiplatelets: Secondary | ICD-10-CM | POA: Insufficient documentation

## 2018-04-19 DIAGNOSIS — E785 Hyperlipidemia, unspecified: Secondary | ICD-10-CM | POA: Insufficient documentation

## 2018-04-19 DIAGNOSIS — Z7984 Long term (current) use of oral hypoglycemic drugs: Secondary | ICD-10-CM | POA: Diagnosis not present

## 2018-04-19 DIAGNOSIS — I2510 Atherosclerotic heart disease of native coronary artery without angina pectoris: Secondary | ICD-10-CM | POA: Insufficient documentation

## 2018-04-19 DIAGNOSIS — I4891 Unspecified atrial fibrillation: Secondary | ICD-10-CM | POA: Insufficient documentation

## 2018-04-19 DIAGNOSIS — I252 Old myocardial infarction: Secondary | ICD-10-CM | POA: Diagnosis not present

## 2018-04-19 LAB — CBC
HCT: 49.7 % (ref 39.0–52.0)
Hemoglobin: 17.1 g/dL — ABNORMAL HIGH (ref 13.0–17.0)
MCH: 31.5 pg (ref 26.0–34.0)
MCHC: 34.4 g/dL (ref 30.0–36.0)
MCV: 91.5 fL (ref 78.0–100.0)
Platelets: 160 10*3/uL (ref 150–400)
RBC: 5.43 MIL/uL (ref 4.22–5.81)
RDW: 13.2 % (ref 11.5–15.5)
WBC: 12.1 10*3/uL — ABNORMAL HIGH (ref 4.0–10.5)

## 2018-04-19 LAB — BASIC METABOLIC PANEL
Anion gap: 12 (ref 5–15)
BUN: 19 mg/dL (ref 6–20)
CO2: 23 mmol/L (ref 22–32)
Calcium: 9.8 mg/dL (ref 8.9–10.3)
Chloride: 103 mmol/L (ref 101–111)
Creatinine, Ser: 1.01 mg/dL (ref 0.61–1.24)
GFR calc Af Amer: >60 mL/min (ref >60)
GFR calc non Af Amer: >60 mL/min (ref >60)
Glucose, Bld: 153 mg/dL — ABNORMAL HIGH (ref 65–99)
Potassium: 4.1 mmol/L (ref 3.5–5.1)
Sodium: 138 mmol/L (ref 135–145)

## 2018-04-19 LAB — I-STAT TROPONIN, ED: Troponin i, poc: 0.01 ng/mL (ref 0.00–0.08)

## 2018-04-19 NOTE — ED Triage Notes (Signed)
Reports working in yard today when he noticed heart racing and sob.  Hx of same.  On pradaxa.

## 2018-04-19 NOTE — ED Provider Notes (Signed)
[facility] EMERGENCY DEPARTMENT Provider Note   CSN: [id number] Arrival date & time: 04/19/18  1900     History   Chief Complaint Chief Complaint  Patient presents with  . Atrial Fibrillation    HPI [name] [name]. is a 67 y.o. male.  HPI   Patient 67 year old male presenting with A. fib.  Patient has had history of 12 cardioversions in the past.  Patient did not have any chest pain with it.  He noted it happened when he was working outside.  He noted that he was working very hard, became fatigued, and sat down felt his pulse and noted it to be elevated.  Patient sneezed twice in triage.  Felt his pulse and felt to go back to normal.  Pateint is on Pradaxa.  Past Medical History:  Diagnosis Date  . Atrial fibrillation ([place]) 03/22/2009   a. s/p multiple DCCV; b. no coumadin due to low TE risk profile; c. Tikosyn Rx  . Coronary atherosclerosis of native coronary artery 11/2002   a. s/p stent to LAD 12/03; OM2 occluded at cath 12/03; d. myoview 5/10: no ischemia;  e. echo 7/11: EF 55%, BAE, mild RVE, PASP 41-45; Myoview was in March 2013. There was no ischemia or infarction, EF 51%   . Cutaneous abscess of back excluding buttocks 07/04/2014   Appears to stem from possibly a cyst very large area 6 cm contact surgeon office   . Diabetes mellitus without complication ([place])   . Drusen body    see opth note  . ERECTILE DYSFUNCTION 03/22/2009  . GERD 03/22/2009  . HYPERGLYCEMIA 04/25/2010  . HYPERLIPIDEMIA 03/22/2009  . 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 LATERAL EPICONDYLITIS, LEFT 10/24/2009  . LIVER FUNCTION TESTS, ABNORMAL 04/25/2010  . Local reaction to immunization 05/05/2012   minor resolving  zostavax   . Myocardial infarction ([place]) mi2003  . Numbness in left leg    foot related to back disease and surgery  . Obesity, unspecified 04/24/2009  . Perforated appendicitis with necrosis s/p open appendectomy 06/07/14 06/04/2014    . Renal cyst    Characterized by MRI as simple  . Ruptured suppurative appendicitis    2015   . SLEEP APNEA, OBSTRUCTIVE 03/22/2009   compliant with CPAP  . THROMBOCYTOPENIA 08/16/2010  . TOBACCO USE, QUIT 10/24/2009  . 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 Umbilical hernia     Patient Active Problem List   Diagnosis Date Noted  . Acute back pain with sciatica, right 09/04/2017  . Right hip pain 09/04/2017  . Thrombocytopenia ([place]) 06/25/2016  . Atrial fibrillation ([place]) 01/01/2016  . Iliac aneurysm ([place])   . Morbid obesity ([place]) 07/08/2014  . Type 2 diabetes mellitus with other circulatory complications ([place]) 75/91/6384  . Chronic anticoagulation 12/21/2013  . Sleep disorder 06/21/2013  . Decreased hearing 08/06/2012  . Hemorrhoids 08/06/2012  . High risk medication use 05/05/2012  . Anticoagulation management encounter 05/05/2012  . Retinal tear 04/29/2012  . Personal history of colonic polyps 05/05/2011  . Visit for preventive health examination 05/05/2011  . PALPITATIONS 08/16/2010  . LIVER FUNCTION TESTS, ABNORMAL 04/25/2010  . Lateral epicondylitis 10/24/2009  . TOBACCO USE, QUIT 10/24/2009  . OBESITY, UNSPECIFIED 04/24/2009  . Hyperlipidemia 03/22/2009  . ERECTILE DYSFUNCTION 03/22/2009  . Obstructive sleep apnea 03/22/2009  . 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 MYOCARDIAL INFARCTION, HX OF 03/22/2009  . Coronary atherosclerosis 03/22/2009  . Atrial fibrillation with  RVR ([place]) 03/22/2009  . GERD 03/22/2009    Past Surgical History:  Procedure Laterality Date  . APPENDECTOMY N/A 06/06/2014   Procedure: APPENDECTOMY;  Surgeon: Odis Hollingshead, MD;  Location: [facility];  Service: General;  Laterality: N/A;  . COLON SURGERY    . COLONOSCOPY  11/15/2011   Procedure: COLONOSCOPY;  Surgeon: Juanita Craver, MD;  Location: [facility];  Service: Endoscopy;  Laterality: N/A;  . COLONOSCOPY WITH PROPOFOL N/A 01/03/2017   Procedure: COLONOSCOPY WITH PROPOFOL;  Surgeon: Carol Ada, MD;  [facility]: [facility];  Service: Endoscopy;  Laterality: N/A;  . cyst on epiglottis  08/2002  . ESOPHAGOGASTRODUODENOSCOPY  11/15/2011   Procedure: ESOPHAGOGASTRODUODENOSCOPY (EGD);  Surgeon: Juanita Craver, MD;  [facility];  Service: Endoscopy;  Laterality: N/A;  . LAPAROSCOPIC APPENDECTOMY N/A 06/06/2014   Procedure: APPENDECTOMY LAPAROSCOPIC attemted;  Surgeon: Odis Hollingshead, MD;  [facility]: [facility];  Service: General;  Laterality: N/A;  . LUMBAR DISC SURGERY     two  holes in spinalcovering  . stent     LAD DUS 2004  . surgery l4-l5   1998   ruptured x 3  . ulnar neuropathy    . UMBILICAL HERNIA REPAIR     mesh        Home Medications    Prior to Admission medications   Medication Sig Start Date End Date Taking? Authorizing Provider  acetaminophen (TYLENOL) 325 MG tablet Take 650 mg by mouth every 6 (six) hours as needed for mild pain.    [provider]  albuterol (PROVENTIL HFA;VENTOLIN HFA) 108 (90 Base) MCG/ACT inhaler Inhale 2 puffs into the lungs every 6 (six) hours as needed for wheezing or shortness of breath. 03/24/17   Panosh, Standley Brooking, MD  atorvastatin (LIPITOR) 80 MG tablet TAKE 1 TABLET BY MOUTH  DAILY 08/14/17   Panosh, Standley Brooking, MD  diphenhydrAMINE (BENADRYL) 25 MG tablet Take 25 mg by mouth at bedtime as needed for sleep.    [provider]  dofetilide (TIKOSYN) 500 MCG capsule TAKE 1 CAPSULE BY MOUTH TWO TIMES DAILY 03/16/18   Lelon Perla, MD  famotidine (PEPCID) 10 MG tablet Take 10 mg by mouth daily.    [provider]  fish oil-omega-3 fatty acids 1000 MG capsule Take 1 g by mouth 2 (two) times daily.     [provider]  fluticasone (FLONASE) 50 MCG/ACT nasal spray Place 2 sprays into both nostrils daily. 04/13/18   Panosh, Standley Brooking, MD  KLOR-CON M20 20 MEQ tablet TAKE 1 TABLET BY MOUTH EVERY DAY 10/06/17   Lelon Perla, MD  Magnesium Oxide 500 MG TABS Take 750 mg by mouth daily.    [provider]  metFORMIN (GLUCOPHAGE-XR) 500 MG 24 hr tablet TAKE 3 TABLETS BY MOUTH  DAILY WITH BREAKFAST 02/12/18   Panosh, Standley Brooking, MD  MULTIPLE VITAMIN PO Take 1 tablet by mouth every evening.     [provider]  nitroGLYCERIN (NITROSTAT) 0.4 MG SL tablet Place 1 tablet (0.4 mg total) under the tongue every 5 (five) minutes as needed for chest pain. 11/18/16   Lelon Perla, MD  ONETOUCH VERIO test strip USE TWICE A DAY 07/04/17   Panosh, Standley Brooking, MD  PRADAXA 150 MG CAPS capsule TAKE 1 CAPSULE BY MOUTH  EVERY 12 HOURS 11/11/17   Panosh, Standley Brooking, MD  Suvorexant (BELSOMRA) 20 MG TABS Take 1 tablet by mouth at bedtime. 12/29/17   Panosh, Standley Brooking, MD  traMADol (ULTRAM) 50 MG tablet Take 1-2 tablets (50-100 mg total) by mouth 3 (three) times daily as needed. 04/08/18   Mcarthur Rossetti, MD    Family History Family History  Problem Relation Age of Onset  . Thyroid disease Mother   . Ovarian cancer Mother   . Breast cancer Mother   . Lung cancer Father   . Cancer Father     Social History Social History   Tobacco Use  . Smoking status: Former Smoker    Packs/day: 2.00    Years: 42.00    Pack years: 84.00    Types: Cigarettes    Last attempt to quit: 12/30/2008    Years since quitting: 9.3  . Smokeless tobacco: Never Used  . Tobacco comment: started at age 49; 1-2 ppd; quit 2010  Substance Use Topics  . Alcohol use: No    Alcohol/week: 0.0 oz    Comment: rarely; maybe 1 beer a year  . Drug use: No     Allergies   Penicillins; Procaine hcl; Pseudoephedrine; Sulfonamide derivatives; and Cardizem [diltiazem]   Review of Systems Review of Systems  Constitutional: Negative for activity change.  Respiratory: Negative for shortness of breath.   Cardiovascular: Negative for chest pain.  Gastrointestinal: Negative for abdominal pain.     Physical Exam Updated Vital Signs BP 113/77 (BP Location: Right Arm)   Pulse (!) 131   Temp 97.9 F (36.6 C) (Oral)    Resp 18   Ht 6\' 1"  (1.854 m)   Wt 132.9 kg (293 lb)   SpO2 94%   BMI 38.66 kg/m   Physical Exam  Constitutional: He is oriented to person, place, and time. He appears well-nourished.  HENT:  Head: Normocephalic.  Eyes: Conjunctivae are normal.  Cardiovascular: Normal rate and regular rhythm.  No murmur heard. Pulmonary/Chest: Effort normal and breath sounds normal.  Neurological: He is oriented to person, place, and time.  Skin: Skin is warm and dry. He is not diaphoretic.  Psychiatric: He has a normal mood and affect. His behavior is normal.  Nursing note and vitals reviewed.    ED Treatments / Results  Labs (all labs ordered are listed, but only abnormal results are displayed) Labs Reviewed  BASIC METABOLIC PANEL - Abnormal; Notable for the following components:      Result Value   Glucose, Bld 153 (*)    All other components within normal limits  CBC - Abnormal; Notable for the following components:   WBC 12.1 (*)    Hemoglobin 17.1 (*)    All other components within normal limits  I-STAT TROPONIN, ED    EKG EKG Interpretation  Date/Time:  Sunday April 19 2018 20:53:34 EDT Ventricular Rate:  77 PR Interval:    QRS Duration: 99 QT Interval:  404 QTC Calculation: 458 R Axis:   129 Text Interpretation:  Sinus rhythm Normal sinus rhythm Confirmed by Thomasene Lot, [place] [phone]) on 04/19/2018 9:23:15 PM   Radiology Dg Chest 2 View  Result Date: 04/19/2018 CLINICAL DATA:  Shortness of breath, atrial fibrillation EXAM: CHEST - 2 VIEW COMPARISON:  01/02/2018 FINDINGS: Cardiomegaly. Peribronchial thickening and interstitial prominence, similar prior study. Linear areas of atelectasis or scarring in the lung bases. No effusions or acute bony abnormality. IMPRESSION: Stable interstitial prominence, likely chronic interstitial lung disease. Cardiomegaly. Bibasilar atelectasis or scarring. Electronically Signed   By: Rolm Baptise M.D.   On: 04/19/2018 20:00     Procedures Procedures (including critical care time)  Medications Ordered in ED  Medications - No data to display   Initial Impression / Assessment and Plan / ED Course  I have reviewed the triage vital signs and the nursing notes.  Pertinent labs & imaging results that were available during my care of the patient were reviewed by me and considered in my medical decision making (see chart for details).     Patient 67 year old male presenting with A. fib.  Patient has had history of 12 cardioversions in the past.  Patient did not have any chest pain with it.  He noted it happened when he was working outside.  He noted that he was working very hard, became fatigued, and sat down felt his pulse and noted it to be elevated.  Patient sneezed twice in triage.  Felt his pulse and felt to go back to normal.  Pateint is on Pradaxa.  Patient in sinus now. Will discahrge with follow up with cardiology.   Final Clinical Impressions(s) / ED Diagnoses   Final diagnoses:  None    ED Discharge Orders    None       Macarthur Critchley, MD 04/22/18 [phone]

## 2018-04-19 NOTE — Discharge Instructions (Addendum)
You were seen with A. fib today.  This resolved on its own.  Please follow-up with your primary care physician and your cardiologist.

## 2018-04-20 MED ORDER — ONDANSETRON HCL 4 MG/2ML IJ SOLN
INTRAMUSCULAR | Status: AC
  Filled 2018-04-20: qty 2

## 2018-04-21 ENCOUNTER — Other Ambulatory Visit: Payer: Self-pay | Admitting: Internal Medicine

## 2018-04-29 ENCOUNTER — Ambulatory Visit (INDEPENDENT_AMBULATORY_CARE_PROVIDER_SITE_OTHER): Payer: Medicare Other | Admitting: Orthopaedic Surgery

## 2018-04-30 ENCOUNTER — Encounter: Payer: Self-pay | Admitting: Physician Assistant

## 2018-04-30 ENCOUNTER — Ambulatory Visit (INDEPENDENT_AMBULATORY_CARE_PROVIDER_SITE_OTHER): Payer: Medicare Other | Admitting: Physician Assistant

## 2018-04-30 VITALS — BP 122/75 | HR 64 | Ht 73.0 in | Wt 293.6 lb

## 2018-04-30 DIAGNOSIS — Z79899 Other long term (current) drug therapy: Secondary | ICD-10-CM | POA: Diagnosis not present

## 2018-04-30 DIAGNOSIS — I2510 Atherosclerotic heart disease of native coronary artery without angina pectoris: Secondary | ICD-10-CM

## 2018-04-30 DIAGNOSIS — I480 Paroxysmal atrial fibrillation: Secondary | ICD-10-CM | POA: Diagnosis not present

## 2018-04-30 DIAGNOSIS — I723 Aneurysm of iliac artery: Secondary | ICD-10-CM | POA: Diagnosis not present

## 2018-04-30 DIAGNOSIS — Z7901 Long term (current) use of anticoagulants: Secondary | ICD-10-CM

## 2018-04-30 NOTE — Patient Instructions (Signed)
Medication Instructions:  Continue current medications  If you need a refill on your cardiac medications before your next appointment, please call your pharmacy.  Labwork: Magnesium level Today HERE IN OUR OFFICE AT [facility]  Take the provided lab slips with you to the lab for your blood draw.   You will NOT need to fast   Testing/Procedures: None Ordered   Follow-Up: Your physician wants you to follow-up in: Keep appointment on 07/27/2018 @ 110:40 pm.    Thank you for choosing [facility] at [facility]!!

## 2018-04-30 NOTE — Progress Notes (Signed)
Cardiology Office Note   Date:  04/30/2018   ID:  [name] Chimes., DOB 06/20/1951, MRN [id number]  PCP:  Burnis Medin, MD  Cardiologist: Dr. Stanford Breed, 12/12/2017 Electrophysiologist: Dr. Madolyn Frieze, PA-C 12/26/2016  Chief Complaint  Patient presents with  . Follow-up    History of Present Illness: [name] Nevares. is a 67 y.o. male with a history of stent to LAD in 2003, parox AFib s/p DCCV x 12, HL, OSA. Hx rash w/ Cardizem, so on Tikosyn, Pradaxa. EF nl w/ grade 1 dd echo 05/2016, iliac aneurysm w/ mgt per [facility], obesity  12/12/2017 office visit 04/19/2018 ER visit, patient felt himself go into atrial fibrillation and went to the ER but sneezed twice and spontaneously converted to sinus rhythm, cardiology follow-up recommended  [name] Chimes. presents for cardiology follow up.  He is staying active and busy. Gets some DOE, that is chronic. He has some scarring in his lungs from when he used to smoke.   He has not had chest pain.   The day he had the afib, he was out in the yard, picking up limbs.  He was exerting himself more than usual.  He noticed it when he carried a trash can full of brush to the curb and then started back up a hill.  This is the first episode of atrial fibrillation that has not required a cardioversion.  He is very busy around the house, and yard.  He helps care for his wife who is disabled.  He helps care for his 2 grandchildren and that keeps him busy as well.  He does all the grocery shopping and errand running.  All these things keeping very busy, but he realizes that he does not have prolonged exertion except perhaps with the grandchildren.  He has worked very hard to lose weight and is very proud of the fact that he has lost approximately 60 pounds.  Every time he has an episode of atrial fibrillation, he thinks again about having an ablation.  Dr. Rayann Heman told him he would not do it until his weight was less than 300 pounds,  which it now is.   Past Medical History:  Diagnosis Date  . Atrial fibrillation ([place]) 03/22/2009   a. s/p multiple DCCV; b. no coumadin due to low TE risk profile; c. Tikosyn Rx  . Coronary atherosclerosis of native coronary artery 11/2002   a. s/p stent to LAD 12/03; OM2 occluded at cath 12/03; d. myoview 5/10: no ischemia;  e. echo 7/11: EF 55%, BAE, mild RVE, PASP 41-45; Myoview was in March 2013. There was no ischemia or infarction, EF 51%   . Cutaneous abscess of back excluding buttocks 07/04/2014   Appears to stem from possibly a cyst very large area 6 cm contact surgeon office   . Diabetes mellitus without complication ([place])   . Drusen body    see opth note  . ERECTILE DYSFUNCTION 03/22/2009  . GERD 03/22/2009  . HYPERGLYCEMIA 04/25/2010  . HYPERLIPIDEMIA 03/22/2009  . 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 LATERAL EPICONDYLITIS, LEFT 10/24/2009  . LIVER FUNCTION TESTS, ABNORMAL 04/25/2010  . Local reaction to immunization 05/05/2012   minor resolving  zostavax   . Myocardial infarction ([place]) mi2003  . Numbness in left leg    foot related to back disease and surgery  . Obesity, unspecified 04/24/2009  . Perforated appendicitis with necrosis s/p open appendectomy  06/07/14 06/04/2014  . Renal cyst    Characterized by MRI as simple  . Ruptured suppurative appendicitis    2015   . SLEEP APNEA, OBSTRUCTIVE 03/22/2009   compliant with CPAP  . THROMBOCYTOPENIA 08/16/2010  . TOBACCO USE, QUIT 10/24/2009  . 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 Umbilical hernia     Past Surgical History:  Procedure Laterality Date  . APPENDECTOMY N/A 06/06/2014   Procedure: APPENDECTOMY;  Surgeon: Odis Hollingshead, MD;  [facility];  Service: General;  Laterality: N/A;  . COLON SURGERY    . COLONOSCOPY  11/15/2011   Procedure: COLONOSCOPY;  Surgeon: Juanita Craver, MD;  [facility];  Service: Endoscopy;  Laterality: N/A;  . COLONOSCOPY WITH PROPOFOL N/A 01/03/2017    Procedure: COLONOSCOPY WITH PROPOFOL;  Surgeon: Carol Ada, MD;  [facility];  Service: Endoscopy;  Laterality: N/A;  . cyst on epiglottis  08/2002  . ESOPHAGOGASTRODUODENOSCOPY  11/15/2011   Procedure: ESOPHAGOGASTRODUODENOSCOPY (EGD);  Surgeon: Juanita Craver, MD;  [facility];  Service: Endoscopy;  Laterality: N/A;  . LAPAROSCOPIC APPENDECTOMY N/A 06/06/2014   Procedure: APPENDECTOMY LAPAROSCOPIC attemted;  Surgeon: Odis Hollingshead, MD;  Location: [facility];  Service: General;  Laterality: N/A;  . LUMBAR DISC SURGERY     two  holes in spinalcovering  . stent     LAD DUS 2004  . surgery l4-l5   1998   ruptured x 3  . ulnar neuropathy    . UMBILICAL HERNIA REPAIR     mesh    Current Outpatient Medications  Medication Sig Dispense Refill  . acetaminophen (TYLENOL) 325 MG tablet Take 650 mg by mouth every 6 (six) hours as needed for mild pain.    [name] albuterol (PROVENTIL HFA;VENTOLIN HFA) 108 (90 Base) MCG/ACT inhaler Inhale 2 puffs into the lungs every 6 (six) hours as needed for wheezing or shortness of breath. 1 Inhaler 0  . atorvastatin (LIPITOR) 80 MG tablet TAKE 1 TABLET BY MOUTH  DAILY 90 tablet 1  . diphenhydrAMINE (BENADRYL) 25 MG tablet Take 25 mg by mouth at bedtime as needed for sleep.    [name] dofetilide (TIKOSYN) 500 MCG capsule TAKE 1 CAPSULE BY MOUTH TWO TIMES DAILY 180 capsule 3  . famotidine (PEPCID) 10 MG tablet Take 10 mg by mouth daily.    . fish oil-omega-3 fatty acids 1000 MG capsule Take 1 g by mouth 2 (two) times daily.     . fluticasone (FLONASE) 50 MCG/ACT nasal spray Place 2 sprays into both nostrils daily. 16 g 6  . KLOR-CON M20 20 MEQ tablet TAKE 1 TABLET BY MOUTH EVERY DAY 90 tablet 2  . Magnesium Oxide 500 MG TABS Take 750 mg by mouth daily.    . metFORMIN (GLUCOPHAGE-XR) 500 MG 24 hr tablet TAKE 3 TABLETS BY MOUTH  DAILY WITH BREAKFAST 270 tablet 1  . MULTIPLE VITAMIN PO Take 1 tablet by mouth every evening.     . nitroGLYCERIN (NITROSTAT)  0.4 MG SL tablet Place 1 tablet (0.4 mg total) under the tongue every 5 (five) minutes as needed for chest pain. 25 tablet 12  . ONETOUCH VERIO test strip USE TWICE A DAY 50 each 12  . PRADAXA 150 MG CAPS capsule TAKE 1 CAPSULE BY MOUTH  EVERY 12 HOURS 180 capsule 1  . Suvorexant (BELSOMRA) 20 MG TABS Take 1 tablet by mouth at bedtime. 90 tablet 1  . traMADol (ULTRAM) 50 MG tablet Take 1-2 tablets (50-100 mg total) by mouth 3 (  three) times daily as needed. 60 tablet 0   Current Facility-Administered Medications  Medication Dose Route Frequency Provider Last Rate Last Dose  . ipratropium-albuterol (DUONEB) 0.5-2.5 (3) MG/3ML nebulizer solution 3 mL  3 mL Nebulization Q6H Panosh, Standley Brooking, MD   3 mL at 01/02/18 1545    Allergies:   Penicillins; Procaine hcl; Pseudoephedrine; Sulfonamide derivatives; and Cardizem [diltiazem]    Social History:  The patient  reports that he quit smoking about 9 years ago. His smoking use included cigarettes. He has a 84.00 pack-year smoking history. He has never used smokeless tobacco. He reports that he does not drink alcohol or use drugs.   Family History:  The patient's family history includes Breast cancer in his mother; Cancer in his father; Lung cancer in his father; Ovarian cancer in his mother; Thyroid disease in his mother.    ROS:  Please see the history of present illness. All other systems are reviewed and negative.    PHYSICAL EXAM: VS:  BP 122/75   Pulse 64   Ht 6\' 1"  (1.854 m)   Wt 293 lb 9.6 oz (133.2 kg)   BMI 38.74 kg/m  , BMI Body mass index is 38.74 kg/m. GEN: Well nourished, well developed, male in no acute distress  HEENT: normal for age  Neck: no JVD, no carotid bruit, no masses Cardiac: RRR; no murmur, no rubs, or gallops Respiratory:  clear to auscultation bilaterally, normal work of breathing GI: soft, nontender, nondistended, + BS MS: no deformity or atrophy; trace lower extremity edema; distal pulses are 2+ in all 4  extremities   Skin: warm and dry, no rash Neuro:  Strength and sensation are intact Psych: euthymic mood, full affect   EKG:  EKG is not ordered today.   ECHO: 06/06/2016 - Left ventricle: The cavity size was normal. Wall thickness was   normal. Systolic function was normal. The estimated ejection   fraction was in the range of 55% to 60%. Wall motion was normal;   there were no regional wall motion abnormalities. Doppler   parameters are consistent with abnormal left ventricular   relaxation (grade 1 diastolic dysfunction). - Mitral valve: Calcified annulus. - Left atrium: The atrium was mildly dilated.   Recent Labs: 06/27/2017: TSH 2.05 12/25/2017: ALT 74; Magnesium 2.0 04/19/2018: BUN 19; Creatinine, Ser 1.01; Hemoglobin 17.1; Platelets 160; Potassium 4.1; Sodium 138    Lipid Panel    Component Value Date/Time   CHOL 95 (L) 12/25/2017 0937   TRIG 104 12/25/2017 0937   HDL 44 12/25/2017 0937   CHOLHDL 3 06/27/2017 0948   VLDL 22.6 06/27/2017 0948   LDLCALC 30 12/25/2017 0937     Wt Readings from Last 3 Encounters:  04/30/18 293 lb 9.6 oz (133.2 kg)  04/19/18 293 lb (132.9 kg)  04/13/18 293 lb 3.2 oz (133 kg)     Other studies Reviewed: Additional studies/ records that were reviewed today include: Office notes, hospital records and testing.  ASSESSMENT AND PLAN:  1.  CAD: He is not on aspirin because of the Pradaxa.  Continue statin.  He is not having any ischemic symptoms, no need for additional testing.  2. PAF: For the first time, he managed to convert himself by sneezing.  He is compliant with the [place] and and does not miss a dose.  He had labs drawn in the emergency room where his potassium was 4.1, continue current supplement.  He has not had a magnesium level checked in several months, check  this.  Continue Tikosyn.  We discussed atrial fibrillation ablation.  His atrial fibrillation episodes have greatly decreased in frequency since being on the Tikosyn.  He  is on the highest dose.  If the episodes increase in frequency, follow-up with Dr. Rayann Heman to consider ablation.  3.  Chronic anticoagulation: He is compliant with her Pradaxa and has not missed doses, no bleeding issues.  4.  Iliac aneurysm: This is followed by Dr. Scot Dock.  His blood pressure is normal.  Current medicines are reviewed at length with the patient today.  The patient does not have concerns regarding medicines.  The following changes have been made:  no change  Labs/ tests ordered today include:   Orders Placed This Encounter  Procedures  . Magnesium     Disposition:   FU with Dr. Stanford Breed  Signed, Rosaria Ferries, PA-C  04/30/2018 4:53 PM    [place] Phone: [phone]; Fax: [phone]  This note was written with the assistance of speech recognition software. Please excuse any transcriptional errors.

## 2018-05-01 LAB — MAGNESIUM: Magnesium: 2.2 mg/dL (ref 1.6–2.3)

## 2018-05-03 ENCOUNTER — Other Ambulatory Visit: Payer: Self-pay | Admitting: Internal Medicine

## 2018-06-01 ENCOUNTER — Telehealth: Payer: Self-pay | Admitting: Pharmacist Clinician (PhC)/ Clinical Pharmacy Specialist

## 2018-06-01 DIAGNOSIS — I480 Paroxysmal atrial fibrillation: Secondary | ICD-10-CM

## 2018-06-01 DIAGNOSIS — H547 Unspecified visual loss: Secondary | ICD-10-CM

## 2018-06-01 NOTE — Addendum Note (Signed)
Addended by: Pixie Casino on: 06/01/2018 04:48 PM   Modules accepted: Orders

## 2018-06-01 NOTE — Telephone Encounter (Signed)
Patient went to ophthalmology d/t grey area in vision.  Occurred over the weekend.  Was told has swollen optic nerve, MD was questioned whether patient possibly had ocular stroke.    Denies missed doses, excessive bleeding or bruising  132/80 HR 56 irr.  Reviewed with Dr. Debara Pickett.  He will order carotid dopplers, as Dr. Stanford Breed is out of the office.

## 2018-06-01 NOTE — Telephone Encounter (Signed)
Called about [name] today by his ophthalmologist. Seen for vision loss - thought to have retinal nerve swelling concerning for possibly cardioembolic phenomenon. There was also concern for hypertension. Sent over for evaluation - BP noted to be normal - he has no history of hypertension. He has been compliant with Pradaxa, but HR is irregular today, so suspect he is in a-fib. He has been on Tikosyn. He does have iliac artery aneurysm - given this history, we should assess for possible carotid artery disease. Will order carotid dopplers. Follow-up with Rosaria Ferries or Dr. Stanford Breed.  Pixie Casino, MD, [facility], [place] Director of the Advanced Lipid Disorders &  Cardiovascular Risk Reduction Clinic Diplomate of the American Board of Clinical Lipidology Attending Cardiologist  Direct Dial: [phone]  Fax: [phone]  Website:  [email or web address]

## 2018-06-02 ENCOUNTER — Ambulatory Visit (INDEPENDENT_AMBULATORY_CARE_PROVIDER_SITE_OTHER): Payer: Medicare Other | Admitting: Family Medicine

## 2018-06-02 ENCOUNTER — Encounter: Payer: Self-pay | Admitting: Family Medicine

## 2018-06-02 VITALS — BP 118/70 | HR 66 | Temp 97.6°F | Wt 294.4 lb

## 2018-06-02 DIAGNOSIS — H47011 Ischemic optic neuropathy, right eye: Secondary | ICD-10-CM

## 2018-06-02 DIAGNOSIS — E1159 Type 2 diabetes mellitus with other circulatory complications: Secondary | ICD-10-CM

## 2018-06-02 DIAGNOSIS — I4891 Unspecified atrial fibrillation: Secondary | ICD-10-CM

## 2018-06-02 DIAGNOSIS — E7800 Pure hypercholesterolemia, unspecified: Secondary | ICD-10-CM

## 2018-06-02 LAB — C-REACTIVE PROTEIN: CRP: <0.1 mg/dL — ABNORMAL LOW (ref 0.5–20.0)

## 2018-06-02 LAB — HEMOGLOBIN A1C: Hgb A1c MFr Bld: 7 % — ABNORMAL HIGH (ref 4.6–6.5)

## 2018-06-02 LAB — SEDIMENTATION RATE: Sed Rate: 6 mm/hr (ref 0–20)

## 2018-06-02 NOTE — Progress Notes (Addendum)
Subjective:     Patient ID: [name] [name]., male   DOB: Nov 10, 1951, 67 y.o.   MRN: [id number]  HPI Patient seen in absence of primary provider who is on vacation.  Chronic problems include history of CAD, atrial fibrillation, iliac aneurysm, OSA, type 2 diabetes, hyperlipidemia. Is on chronic anticoagulation. Quit smoking about 10 years ago. Presented to ophthalmologist last week on Friday with onset of blurred vision right lower quadrant. 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 Patient was set up to see neurologist and also saw cardiology office briefly yesterday for blood pressure check which was normal. He was scheduled for carotid Dopplers.  We received note from ophthalmology office recommending that he get sedimentation rate and C-reactive protein. His diabetes been fairly well controlled with last A1c 7.0%. He is due to get that repeated. He's on aggressive antilipid therapy with Lipitor. He takes Tikosyn for his atrial fibrillation  Past Medical History:  Diagnosis Date  . Atrial fibrillation ([place]) 03/22/2009   a. s/p multiple DCCV; b. no coumadin due to low TE risk profile; c. Tikosyn Rx  . Coronary atherosclerosis of native coronary artery 11/2002   a. s/p stent to LAD 12/03; OM2 occluded at cath 12/03; d. myoview 5/10: no ischemia;  e. echo 7/11: EF 55%, BAE, mild RVE, PASP 41-45; Myoview was in March 2013. There was no ischemia or infarction, EF 51%   . Cutaneous abscess of back excluding buttocks 07/04/2014   Appears to stem from possibly a cyst very large area 6 cm contact surgeon office   . Diabetes mellitus without complication ([place])   . Drusen body    see opth note  . ERECTILE DYSFUNCTION 03/22/2009  . GERD 03/22/2009  . HYPERGLYCEMIA 04/25/2010  . HYPERLIPIDEMIA 03/22/2009  . 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 LATERAL EPICONDYLITIS, LEFT 10/24/2009  . LIVER FUNCTION TESTS, ABNORMAL 04/25/2010  . Local reaction to  immunization 05/05/2012   minor resolving  zostavax   . Myocardial infarction ([place]) mi2003  . Numbness in left leg    foot related to back disease and surgery  . Obesity, unspecified 04/24/2009  . Perforated appendicitis with necrosis s/p open appendectomy 06/07/14 06/04/2014  . Renal cyst    Characterized by MRI as simple  . Ruptured suppurative appendicitis    2015   . SLEEP APNEA, OBSTRUCTIVE 03/22/2009   compliant with CPAP  . THROMBOCYTOPENIA 08/16/2010  . TOBACCO USE, QUIT 10/24/2009  . 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 Umbilical hernia    Past Surgical History:  Procedure Laterality Date  . APPENDECTOMY N/A 06/06/2014   Procedure: APPENDECTOMY;  Surgeon: Odis Hollingshead, MD;  [facility]: [facility];  Service: General;  Laterality: N/A;  . COLON SURGERY    . COLONOSCOPY  11/15/2011   Procedure: COLONOSCOPY;  Surgeon: Juanita Craver, MD;  [facility];  Service: Endoscopy;  Laterality: N/A;  . COLONOSCOPY WITH PROPOFOL N/A 01/03/2017   Procedure: COLONOSCOPY WITH PROPOFOL;  Surgeon: Carol Ada, MD;  [facility];  Service: Endoscopy;  Laterality: N/A;  . cyst on epiglottis  08/2002  . ESOPHAGOGASTRODUODENOSCOPY  11/15/2011   Procedure: ESOPHAGOGASTRODUODENOSCOPY (EGD);  Surgeon: Juanita Craver, MD;  [facility];  Service: Endoscopy;  Laterality: N/A;  . LAPAROSCOPIC APPENDECTOMY N/A 06/06/2014   Procedure: APPENDECTOMY LAPAROSCOPIC attemted;  Surgeon: Odis Hollingshead, MD;  [facility];  Service: General;  Laterality: N/A;  . LUMBAR [place]  two  holes in spinalcovering  . stent     LAD DUS 2004  . surgery l4-l5   1998   ruptured x 3  . ulnar neuropathy    . UMBILICAL HERNIA REPAIR     mesh    reports that he quit smoking about 9 years ago. His smoking use included cigarettes. He has a 84.00 pack-year smoking history. He has never used smokeless tobacco. He reports that he does not drink alcohol or use drugs. family history includes Breast  cancer in his mother; Cancer in his father; Lung cancer in his father; Ovarian cancer in his mother; Thyroid disease in his mother. Allergies  Allergen Reactions  . Penicillins     Childhood reaction-details unknown  . Procaine Hcl Hives and Other (See Comments)    Red in the face and neck.  First generation in 1950s, but after that, I have been able to tolerate novacaine and lidocaine.  . Pseudoephedrine Other (See Comments)    Patient went into afib  . Sulfonamide Derivatives     Childhood reaction   . Cardizem [Diltiazem] Rash       Review of Systems  Constitutional: Negative for fatigue and unexpected weight change.  Eyes: Positive for visual disturbance. Negative for pain.  Respiratory: Negative for cough, chest tightness and shortness of breath.   Cardiovascular: Negative for chest pain, palpitations and leg swelling.  Neurological: Negative for dizziness, syncope, weakness, light-headedness and headaches.  Psychiatric/Behavioral: Negative for confusion.       Objective:   Physical Exam  Constitutional: He is oriented to person, place, and time. He appears well-developed and well-nourished.  HENT:  Right Ear: External ear normal.  Left Ear: External ear normal.  Mouth/Throat: Oropharynx is clear and moist.  Eyes: Pupils are equal, round, and reactive to light.  Neck: Neck supple. No thyromegaly present.  Cardiovascular: Normal rate and regular rhythm.  Pulmonary/Chest: Effort normal and breath sounds normal. No respiratory distress. He has no wheezes. He has no rales.  Musculoskeletal: He exhibits no edema.  Neurological: He is alert and oriented to person, place, and time.       Assessment:     #1 right visual field defect with recent diagnosis of right ischemic optic neuropathy  #2 history of atrial fibrillation-currently appears to be in sinus rhythm clinically and rate stable  #3 type 2 diabetes which has been fairly well controlled    Plan:     -Check  sedimentation rate and C-reactive protein per ophthalmology request. -Recheck hemoglobin A1C (to reassess type 2 diabetes) -Carotid Dopplers- in process of being set up -Continue close follow-up with ophthalmology  Eulas Post MD [facility] at [facility]

## 2018-06-03 ENCOUNTER — Encounter: Payer: Self-pay | Admitting: Diagnostic Neuroimaging

## 2018-06-03 ENCOUNTER — Telehealth: Payer: Self-pay | Admitting: Diagnostic Neuroimaging

## 2018-06-03 ENCOUNTER — Ambulatory Visit (INDEPENDENT_AMBULATORY_CARE_PROVIDER_SITE_OTHER): Payer: Medicare Other | Admitting: Diagnostic Neuroimaging

## 2018-06-03 VITALS — BP 136/73 | HR 56 | Ht 73.0 in | Wt 295.2 lb

## 2018-06-03 DIAGNOSIS — H47011 Ischemic optic neuropathy, right eye: Secondary | ICD-10-CM | POA: Diagnosis not present

## 2018-06-03 MED ORDER — ALPRAZOLAM 0.5 MG PO TABS: 0.5000 mg | ORAL_TABLET | ORAL | 0 refills | Status: DC | PRN

## 2018-06-03 NOTE — Telephone Encounter (Signed)
[employer] Medicare order sent to [facility]. No auth they will reach out to the pt to schedule.  °

## 2018-06-03 NOTE — Progress Notes (Signed)
[facility]  PATIENT: [name] [name]. DOB: 05/29/1951  REFERRING CLINICIAN: Towanda Malkin, MD HISTORY FROM: patient  REASON FOR VISIT: new consult   HISTORICAL  CHIEF COMPLAINT:  Chief Complaint  Patient presents with  . R/O pseudotumor cerebri    rm 7, "visual field deficit since last Friday"    HISTORY OF PRESENT ILLNESS:   67 year old male here for evaluation of possible pseudotumor cerebri.  Last Friday patient was at home, noticed abnormal vision while watching television.  He was not able to see baseball scores on the right lower part of the screen.  He closed his left eye and noticed a gray patch in the right lower visual field.  When he closed his right eye he had no vision problem.  Patient went to eye doctor this past Monday for evaluation.  Patient was noted to have optic nerve swelling and nerve fiber layer edema in the right eye.  Left eye was unremarkable although patient has had a full disc in the left eye in the past.  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  Patient denies any headaches, ringing in the ear, pressure sensations.  No unilateral numbness or weakness.  No slurred speech or trouble talking.  Patient had ESR and CRP lab testing which were normal.  He has seen cardiology and primary care.  Medical risk factor management is being pursued.  Patient currently on statin.  Blood pressure is well controlled.  A1c is 7.0.  Patient has sleep apnea and is on CPAP.  Carotid ultrasound was ordered and is pending.  Patient currently on anticoagulation Pradaxa for atrial fibrillation.    REVIEW OF SYSTEMS: Full 14 system review of systems performed and negative with exception of: Blurred vision loss of vision.  ALLERGIES: Allergies  Allergen Reactions  . Penicillins     Childhood reaction-details unknown  . Procaine Hcl Hives and  Other (See Comments)    Red in the face and neck.  First generation in 1950s, but after that, I have been able to tolerate novacaine and lidocaine.  . Pseudoephedrine Other (See Comments)    Patient went into afib  . Sulfonamide Derivatives     Childhood reaction   . Cardizem [Diltiazem] Rash    HOME MEDICATIONS: Outpatient Medications Prior to Visit  Medication Sig Dispense Refill  . acetaminophen (TYLENOL) 325 MG tablet Take 650 mg by mouth every 6 (six) hours as needed for mild pain.    [name] albuterol (PROVENTIL HFA;VENTOLIN HFA) 108 (90 Base) MCG/ACT inhaler Inhale 2 puffs into the lungs every 6 (six) hours as needed for wheezing or shortness of breath. 1 Inhaler 0  . atorvastatin (LIPITOR) 80 MG tablet TAKE 1 TABLET BY MOUTH  DAILY 90 tablet 1  . diphenhydrAMINE (BENADRYL) 25 MG tablet Take 25 mg by mouth at bedtime as needed for sleep.    [name] dofetilide (TIKOSYN) 500 MCG capsule TAKE 1 CAPSULE BY MOUTH TWO TIMES DAILY 180 capsule 3  . famotidine (PEPCID) 10 MG tablet Take 10 mg by mouth daily.    . fish oil-omega-3 fatty acids 1000 MG capsule Take 1 g by mouth 2 (two) times daily.     . fluticasone (FLONASE) 50 MCG/ACT nasal spray Place 2 sprays into both nostrils daily. 16 g 6  . KLOR-CON M20 20 MEQ tablet TAKE 1 TABLET BY MOUTH EVERY DAY 90 tablet 2  . Magnesium  Oxide 500 MG TABS Take 750 mg by mouth daily.    . metFORMIN (GLUCOPHAGE-XR) 500 MG 24 hr tablet TAKE 3 TABLETS BY MOUTH  DAILY WITH BREAKFAST 270 tablet 1  . MULTIPLE VITAMIN PO Take 1 tablet by mouth every evening.     . nitroGLYCERIN (NITROSTAT) 0.4 MG SL tablet Place 1 tablet (0.4 mg total) under the tongue every 5 (five) minutes as needed for chest pain. 25 tablet 12  . ONETOUCH VERIO test strip USE TWICE A DAY 50 each 12  . PRADAXA 150 MG CAPS capsule TAKE 1 CAPSULE BY MOUTH  EVERY 12 HOURS 180 capsule 1  . Suvorexant (BELSOMRA) 20 MG TABS Take 1 tablet by mouth at bedtime. 90 tablet 1  . traMADol (ULTRAM) 50 MG tablet  Take 1-2 tablets (50-100 mg total) by mouth 3 (three) times daily as needed. 60 tablet 0   Facility-Administered Medications Prior to Visit  Medication Dose Route Frequency Provider Last Rate Last Dose  . ipratropium-albuterol (DUONEB) 0.5-2.5 (3) MG/3ML nebulizer solution 3 mL  3 mL Nebulization Q6H Panosh, Standley Brooking, MD   3 mL at 01/02/18 1545    PAST MEDICAL HISTORY: Past Medical History:  Diagnosis Date  . Atrial fibrillation ([place]) 03/22/2009   a. s/p multiple DCCV; b. no coumadin due to low TE risk profile; c. Tikosyn Rx  . Coronary atherosclerosis of native coronary artery 11/2002   a. s/p stent to LAD 12/03; OM2 occluded at cath 12/03; d. myoview 5/10: no ischemia;  e. echo 7/11: EF 55%, BAE, mild RVE, PASP 41-45; Myoview was in March 2013. There was no ischemia or infarction, EF 51%   . Cutaneous abscess of back excluding buttocks 07/04/2014   Appears to stem from possibly a cyst very large area 6 cm contact surgeon office   . Diabetes mellitus without complication ([place])   . Drusen body    see opth note  . ERECTILE DYSFUNCTION 03/22/2009  . GERD 03/22/2009  . HYPERGLYCEMIA 04/25/2010  . HYPERLIPIDEMIA 03/22/2009  . 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 LATERAL EPICONDYLITIS, LEFT 10/24/2009  . LIVER FUNCTION TESTS, ABNORMAL 04/25/2010  . Local reaction to immunization 05/05/2012   minor resolving  zostavax   . Myocardial infarction ([place]) mi2003  . Numbness in left leg    foot related to back disease and surgery  . Obesity, unspecified 04/24/2009  . Perforated appendicitis with necrosis s/p open appendectomy 06/07/14 06/04/2014  . Renal cyst    Characterized by MRI as simple  . Ruptured suppurative appendicitis    2015   . SLEEP APNEA, OBSTRUCTIVE 03/22/2009   compliant with CPAP  . THROMBOCYTOPENIA 08/16/2010  . TOBACCO USE, QUIT 10/24/2009  . 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 Umbilical hernia     PAST SURGICAL HISTORY: Past Surgical History:    Procedure Laterality Date  . APPENDECTOMY N/A 06/06/2014   Procedure: APPENDECTOMY;  Surgeon: Odis Hollingshead, MD;  [facility];  Service: General;  Laterality: N/A;  . COLON SURGERY    . COLONOSCOPY  11/15/2011   Procedure: COLONOSCOPY;  Surgeon: Juanita Craver, MD;  [facility];  Service: Endoscopy;  Laterality: N/A;  . COLONOSCOPY WITH PROPOFOL N/A 01/03/2017   Procedure: COLONOSCOPY WITH PROPOFOL;  Surgeon: Carol Ada, MD;  [facility];  Service: Endoscopy;  Laterality: N/A;  . cyst on epiglottis  08/2002  . ESOPHAGOGASTRODUODENOSCOPY  11/15/2011   Procedure: ESOPHAGOGASTRODUODENOSCOPY (EGD);  Surgeon: Juanita Craver, MD;  [facility];  Service: Endoscopy;  Laterality: N/A;  . LAPAROSCOPIC APPENDECTOMY N/A 06/06/2014   Procedure: APPENDECTOMY LAPAROSCOPIC attemted;  Surgeon: Odis Hollingshead, MD;  [facility];  Service: General;  Laterality: N/A;  . LUMBAR DISC SURGERY     two  holes in spinalcovering  . stent     LAD DUS 2004  . surgery l4-l5   1998   ruptured x 3  . ulnar neuropathy    . UMBILICAL HERNIA REPAIR     mesh    FAMILY HISTORY: Family History  Problem Relation Age of Onset  . Thyroid disease Mother   . Ovarian cancer Mother   . Breast cancer Mother   . Lung cancer Father   . Cancer Father     SOCIAL HISTORY:  Social History   Socioeconomic History  . Marital status: Married    Spouse name: Not on file  . Number of children: Not on file  . Years of education: Not on file  . Highest education level: Not on file  Occupational History  . Not on file  Social Needs  . Financial resource strain: Not on file  . Food insecurity:    Worry: Not on file    Inability: Not on file  . Transportation needs:    Medical: Not on file    Non-medical: Not on file  Tobacco Use  . Smoking status: Former Smoker    Packs/day: 2.00    Years: 42.00    Pack years: 84.00    Types: Cigarettes    Last attempt to quit: 12/30/2008     Years since quitting: 9.4  . Smokeless tobacco: Never Used  . Tobacco comment: started at age 63; 1-2 ppd; quit 2010  Substance and Sexual Activity  . Alcohol use: No    Alcohol/week: 0.0 oz    Comment: rarely; maybe 1 beer a year  . Drug use: No  . Sexual activity: Yes  Lifestyle  . Physical activity:    Days per week: Not on file    Minutes per session: Not on file  . Stress: Not on file  Relationships  . Social connections:    Talks on phone: Not on file    Gets together: Not on file    Attends religious service: Not on file    Active member of club or organization: Not on file    Attends meetings of clubs or organizations: Not on file    Relationship status: Not on file  . Intimate partner violence:    Fear of current or ex partner: Not on file    Emotionally abused: Not on file    Physically abused: Not on file    Forced sexual activity: Not on file  Other Topics Concern  . Not on file  Social History Narrative   Retired paramedic   Regular exercise-yes not recently    Has children   Wife is overweight and has fibromyalgia and depression on disability doesn't go out much. Back surgery    Has older dog   Retired from [employer] 31 years of service now 7 years    Caffeine- minimal     PHYSICAL EXAM  GENERAL EXAM/CONSTITUTIONAL: Vitals:  Vitals:   06/03/18 1307  BP: 136/73  Pulse: (!) 56  Weight: 295 lb 3.2 oz (133.9 kg)  Height: 6' 1"  (1.854 m)     Body mass index is 38.95 kg/m.  Visual Acuity Screening   Right eye Left eye Both eyes  Without correction:     With correction:  20/40   Comments: Blurry in right eye    Patient is in no distress; well developed, nourished and groomed; neck is supple  CARDIOVASCULAR:  Examination of carotid arteries is normal; no carotid bruits  Regular rate and rhythm, no murmurs  Examination of peripheral vascular system by observation and palpation is normal  EYES:  Ophthalmoscopic exam of optic discs and  posterior segments is unremarkable with non-dilated exam  MUSCULOSKELETAL:  Gait, strength, tone, movements noted in Neurologic exam below  NEUROLOGIC: MENTAL STATUS:  No flowsheet data found.  awake, alert, oriented to person, place and time  recent and remote memory intact  normal attention and concentration  language fluent, comprehension intact, naming intact,   fund of knowledge appropriate  CRANIAL NERVE:   2nd - no papilledema on fundoscopic exam  2nd, 3rd, 4th, 6th - pupils equal and reactive to light, visual fields full to confrontation --> EXCEPT SLIGHT DEFICIT IN RIGHT EYE (RIGHT INFERIOR QUAD), extraocular muscles intact, no nystagmus  5th - facial sensation symmetric  7th - facial strength symmetric  8th - hearing intact  9th - palate elevates symmetrically, uvula midline  11th - shoulder shrug symmetric  12th - tongue protrusion midline  MOTOR:   normal bulk and tone, full strength in the BUE, BLE  SENSORY:   normal and symmetric to light touch, temperature, vibration  COORDINATION:   finger-nose-finger, fine finger movements normal  REFLEXES:   deep tendon reflexes present and symmetric  GAIT/STATION:   narrow based gait    DIAGNOSTIC DATA (LABS, IMAGING, TESTING) - I reviewed patient records, labs, notes, testing and imaging myself where available.  Lab Results  Component Value Date   WBC 12.1 (H) 04/19/2018   HGB 17.1 (H) 04/19/2018   HCT 49.7 04/19/2018   MCV 91.5 04/19/2018   PLT 160 04/19/2018      Component Value Date/Time   NA 138 04/19/2018 1915   NA 139 12/25/2017 0937   K 4.1 04/19/2018 1915   CL 103 04/19/2018 1915   CO2 23 04/19/2018 1915   GLUCOSE 153 (H) 04/19/2018 1915   BUN 19 04/19/2018 1915   BUN 21 12/25/2017 0937   CREATININE 1.01 04/19/2018 1915   CREATININE 0.90 07/31/2016 1318   CALCIUM 9.8 04/19/2018 1915   PROT 6.8 12/25/2017 0937   ALBUMIN 4.5 12/25/2017 0937   AST 30 12/25/2017 0937   ALT  74 (H) 12/25/2017 0937   ALKPHOS 65 12/25/2017 0937   BILITOT 0.6 12/25/2017 0937   GFRNONAA >60 04/19/2018 1915   GFRNONAA >89 08/24/2015 0800   GFRAA >60 04/19/2018 1915   GFRAA >89 08/24/2015 0800   Lab Results  Component Value Date   CHOL 95 (L) 12/25/2017   HDL 44 12/25/2017   LDLCALC 30 12/25/2017   TRIG 104 12/25/2017   CHOLHDL 3 06/27/2017   Lab Results  Component Value Date   HGBA1C 7.0 (H) 06/02/2018   Lab Results  Component Value Date   VITAMINB12 688 08/22/2010   Lab Results  Component Value Date   TSH 2.05 06/27/2017        ASSESSMENT AND PLAN  67 y.o. 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  The symptoms were fairly sudden onset, unilateral, and partial.  This makes pseudotumor cerebri less likely.  In addition lumbar puncture to measure opening pressure would be difficult as patient is currently on anticoagulation.  However will check MRI of the brain to rule out other causes.  Also will check MRI of the orbits to rule out retro-orbital pathology.  Otherwise continue current vascular risk factor management.   Ddx right eye vision loss (partial) --> likely right NAION; idiopathic intracranial hypertension (pseudotumor cerebri) less likely; stroke / optic chiasm or other issue less likely  1. NAION (non-arteritic anterior ischemic optic neuropathy), right      PLAN:  - continue vascular risk factor mgmt (pradaxa, statin, BP control, A1c control) - follow up carotid ultrasound - check MRI brain and orbits w / wo (rule out other causes of vision loss)  Meds ordered this encounter  Medications  . ALPRAZolam (XANAX) 0.5 MG tablet    Sig: Take 1 tablet (0.5 mg total) by mouth as needed for anxiety (for sedation before MRI scan; take 1 hour before scan; may repeat 15 min before scan).    Dispense:  3 tablet    Refill:  0   Orders Placed This Encounter  Procedures  . MR  BRAIN W WO CONTRAST  . MR ORBITS W WO CONTRAST   Return pending test results.  I reviewed images, labs, notes, records myself. I summarized findings and reviewed with patient, for this high risk condition (sudden right eye vision loss) requiring high complexity decision making.     Penni Bombard, MD 06/05/2549, 0:16 PM Certified in Neurology, Neurophysiology and Neuroimaging  [facility] [address], [place], [place] [address] [phone]

## 2018-06-04 ENCOUNTER — Other Ambulatory Visit: Payer: Self-pay | Admitting: Diagnostic Neuroimaging

## 2018-06-04 ENCOUNTER — Encounter (HOSPITAL_COMMUNITY): Payer: Self-pay

## 2018-06-04 ENCOUNTER — Telehealth: Payer: Self-pay | Admitting: Diagnostic Neuroimaging

## 2018-06-04 ENCOUNTER — Ambulatory Visit (HOSPITAL_COMMUNITY): Payer: Medicare Other

## 2018-06-04 ENCOUNTER — Ambulatory Visit (HOSPITAL_COMMUNITY)
Admission: RE | Admit: 2018-06-04 | Discharge: 2018-06-04 | Disposition: A | Discharge status: Home / Self Care | Payer: Medicare Other | Source: Ambulatory Visit | Attending: Diagnostic Neuroimaging | Admitting: Diagnostic Neuroimaging

## 2018-06-04 DIAGNOSIS — H5461 Unqualified visual loss, right eye, normal vision left eye: Secondary | ICD-10-CM

## 2018-06-04 LAB — CREATININE, SERUM
Creatinine, Ser: 1.14 mg/dL (ref 0.61–1.24)
GFR calc Af Amer: >60 mL/min (ref >60)
GFR calc non Af Amer: >60 mL/min (ref >60)

## 2018-06-04 NOTE — Telephone Encounter (Signed)
Patient is ophthalmologist Dr. Patrice Paradise called me today.  Patient called her and reported worsening vision loss symptoms.  We reviewed case together.    MRI scans which I ordered cannot be scheduled until June 11, 2018.  Therefore, I will refer patient to hospital today for more urgent imaging of MRI of the brain and orbits.  If this is negative we may consider empiric trial of acetazolamide 250 mg twice a day and gradually titrate up, for the lower possibility of pseudotumor cerebri.    (Normally we would also consider to check lumbar puncture, but patient is on anticoagulation and has other vascular risk factors, therefore do not feel it is worth the risk of stopping anticoagulant agent to do the lumbar puncture.)  Penni Bombard, MD 07/04/2262, 3:35 PM Certified in Neurology, Neurophysiology and [facility] [address], [place], [place] [address] [phone]

## 2018-06-04 NOTE — Telephone Encounter (Signed)
The patient called in stating his vision is getting worse and stating that Dr. Patrice Paradise office believes his MRI's need to be marked as urgent. Right now he is scheduled at [facility] for 06/11/18 and that is the soonest that they have. I did call [facility] to check that and they had nothing sooner.. If you want the patient to have it has urgent please put a new orders in marked as urgent and then I can call the hospital to see what they have available.[name]

## 2018-06-04 NOTE — Telephone Encounter (Signed)
I spoke to [place] at [facility] she has him scheduled at [facility] for tonight at 4:30 pm I spoke to the patient and informed him to head over there now and he stated he is going there now.

## 2018-06-05 ENCOUNTER — Ambulatory Visit (HOSPITAL_COMMUNITY): Admission: RE | Admit: 2018-06-05 | Payer: Medicare Other | Source: Ambulatory Visit

## 2018-06-05 ENCOUNTER — Encounter (HOSPITAL_COMMUNITY): Payer: Self-pay

## 2018-06-05 ENCOUNTER — Ambulatory Visit (HOSPITAL_COMMUNITY)
Admission: RE | Admit: 2018-06-05 | Discharge: 2018-06-05 | Disposition: A | Discharge status: Home / Self Care | Payer: Medicare Other | Source: Ambulatory Visit | Attending: Diagnostic Neuroimaging | Admitting: Diagnostic Neuroimaging

## 2018-06-05 ENCOUNTER — Ambulatory Visit (HOSPITAL_COMMUNITY)
Admission: RE | Admit: 2018-06-05 | Discharge: 2018-06-05 | Disposition: A | Discharge status: Home / Self Care | Payer: Medicare Other | Source: Ambulatory Visit | Attending: Cardiology | Admitting: Cardiology

## 2018-06-05 DIAGNOSIS — H547 Unspecified visual loss: Secondary | ICD-10-CM | POA: Diagnosis not present

## 2018-06-05 DIAGNOSIS — I480 Paroxysmal atrial fibrillation: Secondary | ICD-10-CM

## 2018-06-05 DIAGNOSIS — H5461 Unqualified visual loss, right eye, normal vision left eye: Secondary | ICD-10-CM | POA: Diagnosis not present

## 2018-06-05 DIAGNOSIS — I2510 Atherosclerotic heart disease of native coronary artery without angina pectoris: Secondary | ICD-10-CM | POA: Insufficient documentation

## 2018-06-05 DIAGNOSIS — I6523 Occlusion and stenosis of bilateral carotid arteries: Secondary | ICD-10-CM | POA: Insufficient documentation

## 2018-06-05 DIAGNOSIS — E119 Type 2 diabetes mellitus without complications: Secondary | ICD-10-CM | POA: Insufficient documentation

## 2018-06-05 DIAGNOSIS — Z87891 Personal history of nicotine dependence: Secondary | ICD-10-CM | POA: Insufficient documentation

## 2018-06-05 MED ORDER — GADOBENATE DIMEGLUMINE 529 MG/ML IV SOLN
20.0000 mL | Freq: Once | INTRAVENOUS | Status: AC
  Administered 2018-06-05: 20 mL via INTRAVENOUS

## 2018-06-05 NOTE — Telephone Encounter (Signed)
Patient is scheduled again at [facility] for 4:30 pm today. Patient is aware of time of the appointment he is going to take his Xanax this time. But I also spoke to [facility] at [place] and she is holding a appointment for me for Tuesday 06/09/18 at 10:30 AM with the open MRI. I informed Larene Beach I will call her on Monday if we need that appointment or not.

## 2018-06-05 NOTE — Telephone Encounter (Signed)
I spoke to the patient this morning. He did not have the MRI because he was too claustrophobic.[name] [name] he canceled his MRI that was scheduled at [facility] for 06/11/18... I informed him that I will try to find something available. I left a message with Larene Beach at [facility] to give me a call back to see when their first available is for maybe the open MRI and if they don't have anything I will contact the hospital again and see what they have.

## 2018-06-05 NOTE — Telephone Encounter (Signed)
He is going to do is carotid doppler today at 10 AM.

## 2018-06-05 NOTE — Telephone Encounter (Signed)
Noted  

## 2018-06-08 ENCOUNTER — Telehealth: Payer: Self-pay | Admitting: *Deleted

## 2018-06-08 NOTE — Telephone Encounter (Addendum)
Called home phone and spoke with wife. She is on DPR. She requested MRI results. Advised her that his MRI brain is normal. The MRI right eye is normal (his affected eye). Advised her that incidentally noted is slight abnormality of his LEFT eye (not related to his symptoms). Advised her it may be a remote injury or artifact. Therefore, his diagnosis is likely right ischemic optic neuropathy. Dr Leta Baptist recommends him to continue vascular risk factor management and to continue current medications. She asked that this RN call her husband on his cell. Spoke with patient and reviewed same results with him. Reviewed Dr Gladstone Lighter plan from last office note. Patient verbalized understanding, bu the made note that on MRI order it stated he has history of HTN. Patient stated he has never had HTN. He did not ask for addendum to be made but wanted to point it out. Will make Dr Leta Baptist aware.

## 2018-06-08 NOTE — Telephone Encounter (Signed)
I spoke to the patient this morning to inform him since he did have his MRI's on Friday I will go ahead and cancel his appointment with [facility] for tomorrow. He stated he appreciate's it. He also informed me he has not received a call about his results yet.   I did call Larene Beach at [facility] and canceled his appt.

## 2018-06-10 ENCOUNTER — Telehealth: Payer: Self-pay | Admitting: Diagnostic Neuroimaging

## 2018-06-10 NOTE — Telephone Encounter (Signed)
I spoke with Dr. Patrice Paradise and reviewed MRI results. Incidental finding on left optic nerve (remote injury vs artifact). Right optic nerve is normal. No other definite imaging clues re: idiopathic intracranial hypertension (pseudotumor cerebri). 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   Also, patient has chronic bilateral optic disc fullness on funduscopic exam; this is of unclear etiology. If it worsens in future, then we may consider empiric trial of acetazolamide.    Penni Bombard, MD 3/43/7357, 8:97 AM Certified in Neurology, Neurophysiology and Neuroimaging  [facility] [address] [place], [place] [address] [phone]

## 2018-06-10 NOTE — Telephone Encounter (Signed)
Dr. Georgeann Oppenheim with [facility] requesting a call back to discuss patient before his appointment today at 9:45. She can be reached at [phone].

## 2018-06-11 ENCOUNTER — Other Ambulatory Visit: Payer: Medicare Other

## 2018-06-16 ENCOUNTER — Telehealth: Payer: Self-pay | Admitting: Diagnostic Neuroimaging

## 2018-06-16 ENCOUNTER — Other Ambulatory Visit: Payer: Self-pay | Admitting: Cardiology

## 2018-06-16 NOTE — Telephone Encounter (Signed)
No LP at this time. -VRP

## 2018-06-16 NOTE — Telephone Encounter (Signed)
Patient called in wanting to know if he was going to get a lumbar puncture scheduled. If so please place order.

## 2018-06-17 ENCOUNTER — Other Ambulatory Visit: Payer: Self-pay | Admitting: Internal Medicine

## 2018-06-17 NOTE — Telephone Encounter (Signed)
Spoke with patient and informed him that Dr Leta Baptist does not recommend LP. Advised patient he has reviewed Dr Dahlia Bailiff note and recommends he be monitored by Dr Zadie Rhine. Advised that if in the future Dr Zadie Rhine found changes, Dr Leta Baptist would discuss with him possibly prescribing a medication. The patient sees Dr Zadie Rhine again on 06/23/18. This RN advised he have Dr Zadie Rhine fax office note to Dr Leta Baptist. The patient asked specifically why Dr Leta Baptist does not recommend LP. This RN advised it is due to his anticoagulation medication and other vascular risks factors.  The patient verbalized understanding, appreciation.

## 2018-06-18 ENCOUNTER — Telehealth: Payer: Self-pay | Admitting: Internal Medicine

## 2018-06-18 NOTE — Telephone Encounter (Signed)
Copied from [place] [id number]. Topic: General - Other >> Jun 18, 2018 10:30 AM Cecelia Byars, NT wrote: Reason for CRM: [facility] called and  would like to know if pcp will approve or deny the presription  for    BELSOMRA 20 MG TABS ,please call    [facility]  [phone]

## 2018-06-19 NOTE — Telephone Encounter (Signed)
Looks like script was printed on 06/17/2018, can I resend?

## 2018-06-19 NOTE — Telephone Encounter (Signed)
Yes please refill    An I please resend   For 90 days

## 2018-06-23 ENCOUNTER — Telehealth: Payer: Self-pay | Admitting: Diagnostic Neuroimaging

## 2018-06-23 MED ORDER — ACETAZOLAMIDE 250 MG PO TABS: 250.0000 mg | ORAL_TABLET | Freq: Two times a day (BID) | ORAL | 6 refills | Status: DC

## 2018-06-23 NOTE — Telephone Encounter (Signed)
Medication phoned to pharmacy as requested.  

## 2018-06-23 NOTE — Telephone Encounter (Signed)
I was called by retina specialist Dr. Radene Ou.  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    Therefore question remains whether patient has 2 independent pathologies (NAION affecting the right eye as well as papilledema related to intracranial hypertension).  Due to difficulty of obtaining lumbar puncture as patient is on anticoagulation, we will go ahead and treat empirically with acetazolamide (start 250mg  daily; gradually increase to 500mg  twice a day). Has remote childhood sulfa antibiotic reaction, but low likelihood of cross reactivity with non-antibioatic sulfa drugs such as acetazolamide. Caution advised, but ok to try.    Meds ordered this encounter  Medications  . acetaZOLAMIDE (DIAMOX) 250 MG tablet    Sig: Take 1-2 tablets (250-500 mg total) by mouth 2 (two) times daily.    Dispense:  60 tablet    Refill:  6    Penni Bombard, MD 08/09/314, 9:45 PM Certified in Neurology, Neurophysiology and [facility] [address] [place], [place] [address] [phone]

## 2018-06-24 NOTE — Progress Notes (Signed)
Chief Complaint  Patient presents with  . Annual Exam    HPI:  [name] [name]. 67 y.o. comes in today for Preventive Medicare exam/ wellness visit .Since last visit. 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  To have fu and placed on diamox   Past few days  Had episode of af resolved bty sneezing    continu to weight loss and feels well otherwise   No adding bleeding  Dm  Last a1c 7  cbg this am 120  No new sx   Health Maintenance  Topic Date Due  . FOOT EXAM  06/27/2018  . URINE MICROALBUMIN  06/27/2018  . INFLUENZA VACCINE  07/30/2018  . OPHTHALMOLOGY EXAM  10/20/2018  . HEMOGLOBIN A1C  12/02/2018  . TETANUS/TDAP  04/05/2025  . COLONOSCOPY  01/03/2027  . Hepatitis C Screening  Completed  . PNA vac Low Risk Adult  Completed   Health Maintenance Review LIFESTYLE:  Exercise:   Active  Tobacco/ETS:n Alcohol: n Sugar beverages:nSleep:4-6 Drug use: no HH:2   Hearing: limited  No chagne from sirens when younger   Vision:  See above   Safety:  Has smoke detector and wears seat belts.  . No excess sun exposure. Sees dentist regularly.  Falls:   ADLS:   There are no problems or need for assistance  driving, feeding, obtaining food, dressing, toileting and bathing, managing money using phone. he is independent.     ROS:  See hpi  GEN/ HEENT: No fever, significant weight changes sweats headaches problems hearing changes, CV/ PULM; No chest pain shortness of breath cough, syncope,edema  change in exercise tolerance. GI /GU: No adominal pain, vomiting, change in bowel habits. No blood in the stool. No significant GU symptoms. SKIN/HEME: ,no acute skin rashes suspicious lesions or bleeding. No lymphadenopathy, nodules, masses.  NEURO/ PSYCH:  No neurologic signs such as weakness numbness. No depression anxiety. IMM/ Allergy: No unusual infections.   Allergy .   REST of 12 system review negative except as per HPI   Past Medical History:  Diagnosis Date  . Atrial fibrillation ([facility]) 03/22/2009   a. s/p multiple DCCV; b. no coumadin due to low TE risk profile; c. Tikosyn Rx  . Coronary atherosclerosis of native coronary artery 11/2002   a. s/p stent to LAD 12/03; OM2 occluded at cath 12/03; d. myoview 5/10: no ischemia;  e. echo 7/11: EF 55%, BAE, mild RVE, PASP 41-45; Myoview was in March 2013. There was no ischemia or infarction, EF 51%   . Cutaneous abscess of back excluding buttocks 07/04/2014   Appears to stem from possibly a cyst very large area 6 cm contact surgeon office   . Diabetes mellitus without complication ([place])   . Drusen body    see opth note  . ERECTILE DYSFUNCTION 03/22/2009  . GERD 03/22/2009  . HYPERGLYCEMIA 04/25/2010  . HYPERLIPIDEMIA 03/22/2009  . 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 LATERAL EPICONDYLITIS, LEFT 10/24/2009  . LIVER FUNCTION TESTS, ABNORMAL 04/25/2010  . Local reaction to immunization 05/05/2012   minor resolving  zostavax   . Myocardial infarction ([facility]) mi2003  . Numbness in left leg    foot related to back disease and surgery  . Obesity, unspecified 04/24/2009  . Perforated appendicitis with necrosis  s/p open appendectomy 06/07/14 06/04/2014  . Renal cyst    Characterized by MRI as simple  . Ruptured suppurative appendicitis    2015   . SLEEP APNEA, OBSTRUCTIVE 03/22/2009   compliant with CPAP  . THROMBOCYTOPENIA 08/16/2010  . TOBACCO USE, QUIT 10/24/2009  . 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 Umbilical hernia     Family History  Problem Relation Age of Onset  . Thyroid disease Mother   . Ovarian cancer Mother   . Breast cancer Mother   . Lung cancer Father   . Cancer Father     Social History   Socioeconomic History  . Marital status: Married    Spouse name: Not on file  . Number of children: Not on file  . Years of education: Not on file  . Highest  education level: Not on file  Occupational History  . Not on file  Social Needs  . Financial resource strain: Not on file  . Food insecurity:    Worry: Not on file    Inability: Not on file  . Transportation needs:    Medical: Not on file    Non-medical: Not on file  Tobacco Use  . Smoking status: Former Smoker    Packs/day: 2.00    Years: 42.00    Pack years: 84.00    Types: Cigarettes    Last attempt to quit: 12/30/2008    Years since quitting: 9.4  . Smokeless tobacco: Never Used  . Tobacco comment: started at age 44; 1-2 ppd; quit 2010  Substance and Sexual Activity  . Alcohol use: No    Alcohol/week: 0.0 oz    Comment: rarely; maybe 1 beer a year  . Drug use: No  . Sexual activity: Yes  Lifestyle  . Physical activity:    Days per week: Not on file    Minutes per session: Not on file  . Stress: Not on file  Relationships  . Social connections:    Talks on phone: Not on file    Gets together: Not on file    Attends religious service: Not on file    Active member of club or organization: Not on file    Attends meetings of clubs or organizations: Not on file    Relationship status: Not on file  Other Topics Concern  . Not on file  Social History Narrative   Retired paramedic   Regular exercise-yes not recently    Has children   Wife is overweight and has fibromyalgia and depression on disability doesn't go out much. Back surgery    Has older dog   Retired from [employer] 31 years of service now 7 years    Caffeine- minimal    Outpatient Encounter Medications as of 06/25/2018  Medication Sig  . acetaminophen (TYLENOL) 325 MG tablet Take 650 mg by mouth every 6 (six) hours as needed for mild pain.  [name] acetaZOLAMIDE (DIAMOX) 250 MG tablet Take 1-2 tablets (250-500 mg total) by mouth 2 (two) times daily.  [name] albuterol (PROVENTIL HFA;VENTOLIN HFA) 108 (90 Base) MCG/ACT inhaler Inhale 2 puffs into the lungs every 6 (six) hours as needed for wheezing or shortness of breath.  [name]  atorvastatin (LIPITOR) 80 MG tablet TAKE 1 TABLET BY MOUTH  DAILY  . BELSOMRA 20 MG TABS TAKE 1 TABLET BY MOUTH AT  BEDTIME  . diphenhydrAMINE (BENADRYL) 25 MG tablet Take 25 mg by mouth at bedtime as needed for sleep.  [name] dofetilide (TIKOSYN) 500 MCG capsule TAKE 1  CAPSULE BY MOUTH TWO TIMES DAILY  . famotidine (PEPCID) 10 MG tablet Take 10 mg by mouth daily.  . fish oil-omega-3 fatty acids 1000 MG capsule Take 1 g by mouth 2 (two) times daily.   . fluticasone (FLONASE) 50 MCG/ACT nasal spray Place 2 sprays into both nostrils daily.  [name] KLOR-CON M20 20 MEQ tablet TAKE 1 TABLET BY MOUTH EVERY DAY  . Magnesium Oxide 500 MG TABS Take 750 mg by mouth daily.  . metFORMIN (GLUCOPHAGE-XR) 500 MG 24 hr tablet TAKE 3 TABLETS BY MOUTH  DAILY WITH BREAKFAST  . MULTIPLE VITAMIN PO Take 1 tablet by mouth every evening.   . nitroGLYCERIN (NITROSTAT) 0.4 MG SL tablet Place 1 tablet (0.4 mg total) under the tongue every 5 (five) minutes as needed for chest pain.  [name] VERIO test strip USE TWICE A DAY  . PRADAXA 150 MG CAPS capsule TAKE 1 CAPSULE BY MOUTH  EVERY 12 HOURS  . traMADol (ULTRAM) 50 MG tablet Take 1-2 tablets (50-100 mg total) by mouth 3 (three) times daily as needed.  . [DISCONTINUED] ALPRAZolam (XANAX) 0.5 MG tablet Take 1 tablet (0.5 mg total) by mouth as needed for anxiety (for sedation before MRI scan; take 1 hour before scan; may repeat 15 min before scan).   Facility-Administered Encounter Medications as of 06/25/2018  Medication  . ipratropium-albuterol (DUONEB) 0.5-2.5 (3) MG/3ML nebulizer solution 3 mL    EXAM:  BP 134/78 (BP Location: Left Arm, Patient Position: Sitting, Cuff Size: Large)   Pulse 66   Temp 97.7 F (36.5 C) (Tympanic)   Ht 5' 11.75" (1.822 m) Comment: no shoes  Wt 288 lb 6 oz (130.8 kg)   SpO2 94%   BMI 39.38 kg/m   Body mass index is 39.38 kg/m.  Physical Exam: Vital signs reviewed ZOX:WRUE is a well-developed well-nourished alert cooperative   who  appears stated age in no acute distress.  Glasses some heart of hearing  HEENT: normocephalic atraumatic , Eyes: PERRL EOM's full, conjunctiva clear, Nares: paten,t no deformity discharge or tenderness., Ears: no deformity EAC's clear TMs with normal landmarks. Mouth: clear OP, no lesions, edema.  Moist mucous membranes. Dentition in adequate repair. NECK: supple without masses, thyromegaly or bruits. CHEST/PULM:  Clear to auscultation and percussion breath sounds equal no wheeze , rales or rhonchi. No chest wall deformities or tenderness. CV: PMI is nondisplaced, S1 S2 no gallops, murmurs, rubs. Peripheral pulses arepresent without delay.No JVD .  ABDOMEN: Bowel sounds normal nontender  No guard or rebound, no hepato splenomegal no CVA tenderness. Old scars   Extremtities:  No clubbing cyanosis or edema, no acute joint swelling or redness no focal atrophy NEURO:  Oriented x3, cranial nerves 3-12 appear to be intact, no obvious focal weakness,gait within normal limits no abnormal reflexes  SKIN: No acute rashes normal turgor, color, no bruising or petechiae. PSYCH: Oriented, good eye contact, no obvious depression anxiety, cognition and judgment appear normal. LN: no cervical axillary inguinal adenopathy No noted deficits in memory, attention, and speech. Diabetic Foot Exam - Simple   Simple Foot Form Diabetic Foot exam was performed with the following findings:  Yes 06/25/2018  9:15 AM  Visual Inspection No deformities, no ulcerations, no other skin breakdown bilaterally:  Yes Sensation Testing Intact to touch and monofilament testing bilaterally:  Yes See comments:  Yes Pulse Check Posterior Tibialis and Dorsalis pulse intact bilaterally:  Yes Comments Dec sensation  Left great toe no ulcers  ASSESSMENT AND PLAN:  Discussed the following assessment and plan:  Visit for preventive health examination - Plan: Basic metabolic panel, CBC with Differential/Platelet, Hepatic  function panel, Lipid panel, Magnesium, TSH, T4, free, PSA, Microalbumin / creatinine urine ratio  Medication management - Plan: Basic metabolic panel, CBC with Differential/Platelet, Hepatic function panel, Lipid panel, Magnesium, TSH, T4, free, PSA, Microalbumin / creatinine urine ratio, Basic metabolic panel  Pure hypercholesterolemia - Plan: Basic metabolic panel, CBC with Differential/Platelet, Hepatic function panel, Lipid panel, Magnesium, TSH, T4, free, PSA, Microalbumin / creatinine urine ratio  Morbid obesity (HCC) - Plan: Basic metabolic panel, CBC with Differential/Platelet, Hepatic function panel, Lipid panel, Magnesium, TSH, T4, free, PSA, Microalbumin / creatinine urine ratio  Type 2 diabetes mellitus with other circulatory complications (HCC) - Plan: Basic metabolic panel, CBC with Differential/Platelet, Hepatic function panel, Lipid panel, Magnesium, TSH, T4, free, PSA, Microalbumin / creatinine urine ratio, Basic metabolic panel  Sleep disorder - Plan: Basic metabolic panel, CBC with Differential/Platelet, Hepatic function panel, Lipid panel, Magnesium, TSH, T4, free, PSA, Microalbumin / creatinine urine ratio  Chronic anticoagulation - Plan: Basic metabolic panel, CBC with Differential/Platelet, Hepatic function panel, Lipid panel, Magnesium, TSH, T4, free, PSA, Microalbumin / creatinine urine ratio  Ischemic optic neuropathy of right eye - Plan: Basic metabolic panel, CBC with Differential/Platelet, Hepatic function panel, Lipid panel, Magnesium, TSH, T4, free, PSA, Microalbumin / creatinine urine ratio Disc  recent happenings and  Coordination of care and optimization of risk factors  Cont  Weight loss  Healthy  And  Fu   Will check l;abs today  And plan bmp in 3-4 weeks on diuretic med  Patient Care Team: Panosh, Standley Brooking, MD as PCP - General Juanita Craver, MD (Gastroenterology) Lelon Perla, MD (Cardiology) Jackolyn Confer, MD as Consulting Physician (General  Surgery) Georgeann Oppenheim, MD as Consulting Physician (Ophthalmology) Chesley Mires, MD as Consulting Physician (Pulmonary Disease) Angelia Mould, MD as Consulting Physician (Vascular Surgery)  Patient Instructions  Will notify you  of labs when available.   Continue healthy   Weight loss .  Plan fu depending on lab  Or 3 months to check a1c etc.     Health Maintenance, Male A healthy lifestyle and preventive care is important for your health and wellness. Ask your health care provider about what schedule of regular examinations is right for you. What should I know about weight and diet? Eat a Healthy Diet  Eat plenty of vegetables, fruits, whole grains, low-fat dairy products, and lean protein.  Do not eat a lot of foods high in solid fats, added sugars, or salt.  Maintain a Healthy Weight Regular exercise can help you achieve or maintain a healthy weight. You should:  Do at least 150 minutes of exercise each week. The exercise should increase your heart rate and make you sweat (moderate-intensity exercise).  Do strength-training exercises at least twice a week.  Watch Your Levels of Cholesterol and Blood Lipids  Have your blood tested for lipids and cholesterol every 5 years starting at 69 years of age. If you are at high risk for heart disease, you should start having your blood tested when you are 69 years old. You may need to have your cholesterol levels checked more often if: ? Your lipid or cholesterol levels are high. ? You are older than 69 years of age. ? You are at high risk for heart disease.  What should I know about cancer screening? Many types of cancers can be detected early  and may often be prevented. Lung Cancer  You should be screened every year for lung cancer if: ? You are a current smoker who has smoked for at least 30 years. ? You are a former smoker who has quit within the past 15 years.  Talk to your health care provider about your screening  options, when you should start screening, and how often you should be screened.  Colorectal Cancer  Routine colorectal cancer screening usually begins at 69 years of age and should be repeated every 5-10 years until you are 69 years old. You may need to be screened more often if early forms of precancerous polyps or small growths are found. Your health care provider may recommend screening at an earlier age if you have risk factors for colon cancer.  Your health care provider may recommend using home test kits to check for hidden blood in the stool.  A small camera at the end of a tube can be used to examine your colon (sigmoidoscopy or colonoscopy). This checks for the earliest forms of colorectal cancer.  Prostate and Testicular Cancer  Depending on your age and overall health, your health care provider may do certain tests to screen for prostate and testicular cancer.  Talk to your health care provider about any symptoms or concerns you have about testicular or prostate cancer.  Skin Cancer  Check your skin from head to toe regularly.  Tell your health care provider about any new moles or changes in moles, especially if: ? There is a change in a mole's size, shape, or color. ? You have a mole that is larger than a pencil eraser.  Always use sunscreen. Apply sunscreen liberally and repeat throughout the day.  Protect yourself by wearing long sleeves, pants, a wide-brimmed hat, and sunglasses when outside.  What should I know about heart disease, diabetes, and high blood pressure?  If you are 51-39 years of age, have your blood pressure checked every 3-5 years. If you are 7 years of age or older, have your blood pressure checked every year. You should have your blood pressure measured twice-once when you are at a hospital or clinic, and once when you are not at a hospital or clinic. Record the average of the two measurements. To check your blood pressure when you are not at a hospital  or clinic, you can use: ? An automated blood pressure machine at a pharmacy. ? A home blood pressure monitor.  Talk to your health care provider about your target blood pressure.  If you are between 73-8 years old, ask your health care provider if you should take aspirin to prevent heart disease.  Have regular diabetes screenings by checking your fasting blood sugar level. ? If you are at a normal weight and have a low risk for diabetes, have this test once every three years after the age of 44. ? If you are overweight and have a high risk for diabetes, consider being tested at a younger age or more often.  A one-time screening for abdominal aortic aneurysm (AAA) by ultrasound is recommended for men aged 24-75 years who are current or former smokers. What should I know about preventing infection? Hepatitis B If you have a higher risk for hepatitis B, you should be screened for this virus. Talk with your health care provider to find out if you are at risk for hepatitis B infection. Hepatitis C Blood testing is recommended for:  Everyone born from [birth year] through 1967.  Anyone  with known risk factors for hepatitis C.  Sexually Transmitted Diseases (STDs)  You should be screened each year for STDs including gonorrhea and chlamydia if: ? You are sexually active and are younger than 69 years of age. ? You are older than 69 years of age and your health care provider tells you that you are at risk for this type of infection. ? Your sexual activity has changed since you were last screened and you are at an increased risk for chlamydia or gonorrhea. Ask your health care provider if you are at risk.  Talk with your health care provider about whether you are at high risk of being infected with HIV. Your health care provider may recommend a prescription medicine to help prevent HIV infection.  What else can I do?  Schedule regular health, dental, and eye exams.  Stay current with your vaccines  (immunizations).  Do not use any tobacco products, such as cigarettes, chewing tobacco, and e-cigarettes. If you need help quitting, ask your health care provider.  Limit alcohol intake to no more than 2 drinks per day. One drink equals 12 ounces of beer, 5 ounces of wine, or 1 ounces of hard liquor.  Do not use street drugs.  Do not share needles.  Ask your health care provider for help if you need support or information about quitting drugs.  Tell your health care provider if you often feel depressed.  Tell your health care provider if you have ever been abused or do not feel safe at home. This information is not intended to replace advice given to you by your health care provider. Make sure you discuss any questions you have with your health care provider. Document Released: 06/13/2008 Document Revised: 08/14/2016 Document Reviewed: 09/19/2015 Elsevier Interactive Patient Education  2018 [place]. Panosh M.D.

## 2018-06-25 ENCOUNTER — Ambulatory Visit (INDEPENDENT_AMBULATORY_CARE_PROVIDER_SITE_OTHER): Payer: Medicare Other | Admitting: Internal Medicine

## 2018-06-25 ENCOUNTER — Encounter: Payer: Self-pay | Admitting: Internal Medicine

## 2018-06-25 VITALS — BP 134/78 | HR 66 | Temp 97.7°F | Ht 71.75 in | Wt 288.4 lb

## 2018-06-25 DIAGNOSIS — Z0000 Encounter for general adult medical examination without abnormal findings: Secondary | ICD-10-CM

## 2018-06-25 DIAGNOSIS — E7800 Pure hypercholesterolemia, unspecified: Secondary | ICD-10-CM

## 2018-06-25 DIAGNOSIS — H47011 Ischemic optic neuropathy, right eye: Secondary | ICD-10-CM

## 2018-06-25 DIAGNOSIS — E1159 Type 2 diabetes mellitus with other circulatory complications: Secondary | ICD-10-CM

## 2018-06-25 DIAGNOSIS — G479 Sleep disorder, unspecified: Secondary | ICD-10-CM | POA: Diagnosis not present

## 2018-06-25 DIAGNOSIS — Z79899 Other long term (current) drug therapy: Secondary | ICD-10-CM

## 2018-06-25 DIAGNOSIS — Z7901 Long term (current) use of anticoagulants: Secondary | ICD-10-CM | POA: Diagnosis not present

## 2018-06-25 LAB — BASIC METABOLIC PANEL
BUN: 18 mg/dL (ref 6–23)
CO2: 24 mEq/L (ref 19–32)
Calcium: 10 mg/dL (ref 8.4–10.5)
Chloride: 105 mEq/L (ref 96–112)
Creatinine, Ser: 1.01 mg/dL (ref 0.40–1.50)
GFR: 77.93 mL/min (ref >60.00)
Glucose, Bld: 124 mg/dL — ABNORMAL HIGH (ref 70–99)
Potassium: 4.3 mEq/L (ref 3.5–5.1)
Sodium: 137 mEq/L (ref 135–145)

## 2018-06-25 LAB — T4, FREE: Free T4: 0.86 ng/dL (ref 0.60–1.60)

## 2018-06-25 LAB — CBC WITH DIFFERENTIAL/PLATELET
Basophils Absolute: 0 10*3/uL (ref 0.0–0.1)
Basophils Relative: 0.3 % (ref 0.0–3.0)
Eosinophils Absolute: 0.5 10*3/uL (ref 0.0–0.7)
Eosinophils Relative: 6.8 % — ABNORMAL HIGH (ref 0.0–5.0)
HCT: 46.4 % (ref 39.0–52.0)
Hemoglobin: 16.1 g/dL (ref 13.0–17.0)
Lymphocytes Relative: 28.6 % (ref 12.0–46.0)
Lymphs Abs: 2.1 10*3/uL (ref 0.7–4.0)
MCHC: 34.6 g/dL (ref 30.0–36.0)
MCV: 91.4 fl (ref 78.0–100.0)
Monocytes Absolute: 0.5 10*3/uL (ref 0.1–1.0)
Monocytes Relative: 6.4 % (ref 3.0–12.0)
Neutro Abs: 4.3 10*3/uL (ref 1.4–7.7)
Neutrophils Relative %: 57.9 % (ref 43.0–77.0)
Platelets: 132 10*3/uL — ABNORMAL LOW (ref 150.0–400.0)
RBC: 5.08 Mil/uL (ref 4.22–5.81)
RDW: 13.6 % (ref 11.5–15.5)
WBC: 7.4 10*3/uL (ref 4.0–10.5)

## 2018-06-25 LAB — HEPATIC FUNCTION PANEL
ALT: 57 U/L — ABNORMAL HIGH (ref 0–53)
AST: 32 U/L (ref 0–37)
Albumin: 4.5 g/dL (ref 3.5–5.2)
Alkaline Phosphatase: 62 U/L (ref 39–117)
Bilirubin, Direct: 0.2 mg/dL (ref 0.0–0.3)
Total Bilirubin: 0.9 mg/dL (ref 0.2–1.2)
Total Protein: 7.2 g/dL (ref 6.0–8.3)

## 2018-06-25 LAB — LIPID PANEL
Cholesterol: 91 mg/dL (ref 0–200)
HDL: 30.6 mg/dL — ABNORMAL LOW (ref >39.00)
LDL Cholesterol: 29 mg/dL (ref 0–99)
NonHDL: 60.53
Total CHOL/HDL Ratio: 3
Triglycerides: 158 mg/dL — ABNORMAL HIGH (ref 0.0–149.0)
VLDL: 31.6 mg/dL (ref 0.0–40.0)

## 2018-06-25 LAB — PSA: PSA: 0.93 ng/mL (ref 0.10–4.00)

## 2018-06-25 LAB — MICROALBUMIN / CREATININE URINE RATIO
Creatinine,U: 91.7 mg/dL
Microalb Creat Ratio: 10.1 mg/g (ref 0.0–30.0)
Microalb, Ur: 9.3 mg/dL — ABNORMAL HIGH (ref 0.0–1.9)

## 2018-06-25 LAB — TSH: TSH: 1.91 u[IU]/mL (ref 0.35–4.50)

## 2018-06-25 LAB — MAGNESIUM: Magnesium: 2.2 mg/dL (ref 1.5–2.5)

## 2018-06-25 NOTE — Patient Instructions (Addendum)
Will notify you  of labs when available.   Continue healthy   Weight loss .  Plan fu depending on lab  Or 3 months to check a1c etc.     Health Maintenance, Male A healthy lifestyle and preventive care is important for your health and wellness. Ask your health care provider about what schedule of regular examinations is right for you. What should I know about weight and diet? Eat a Healthy Diet  Eat plenty of vegetables, fruits, whole grains, low-fat dairy products, and lean protein.  Do not eat a lot of foods high in solid fats, added sugars, or salt.  Maintain a Healthy Weight Regular exercise can help you achieve or maintain a healthy weight. You should:  Do at least 150 minutes of exercise each week. The exercise should increase your heart rate and make you sweat (moderate-intensity exercise).  Do strength-training exercises at least twice a week.  Watch Your Levels of Cholesterol and Blood Lipids  Have your blood tested for lipids and cholesterol every 5 years starting at 69 years of age. If you are at high risk for heart disease, you should start having your blood tested when you are 69 years old. You may need to have your cholesterol levels checked more often if: ? Your lipid or cholesterol levels are high. ? You are older than 69 years of age. ? You are at high risk for heart disease.  What should I know about cancer screening? Many types of cancers can be detected early and may often be prevented. Lung Cancer  You should be screened every year for lung cancer if: ? You are a current smoker who has smoked for at least 30 years. ? You are a former smoker who has quit within the past 15 years.  Talk to your health care provider about your screening options, when you should start screening, and how often you should be screened.  Colorectal Cancer  Routine colorectal cancer screening usually begins at 69 years of age and should be repeated every 5-10 years until you are  69 years old. You may need to be screened more often if early forms of precancerous polyps or small growths are found. Your health care provider may recommend screening at an earlier age if you have risk factors for colon cancer.  Your health care provider may recommend using home test kits to check for hidden blood in the stool.  A small camera at the end of a tube can be used to examine your colon (sigmoidoscopy or colonoscopy). This checks for the earliest forms of colorectal cancer.  Prostate and Testicular Cancer  Depending on your age and overall health, your health care provider may do certain tests to screen for prostate and testicular cancer.  Talk to your health care provider about any symptoms or concerns you have about testicular or prostate cancer.  Skin Cancer  Check your skin from head to toe regularly.  Tell your health care provider about any new moles or changes in moles, especially if: ? There is a change in a mole's size, shape, or color. ? You have a mole that is larger than a pencil eraser.  Always use sunscreen. Apply sunscreen liberally and repeat throughout the day.  Protect yourself by wearing long sleeves, pants, a wide-brimmed hat, and sunglasses when outside.  What should I know about heart disease, diabetes, and high blood pressure?  If you are 106-78 years of age, have your blood pressure checked every 3-5 years.  If you are 101 years of age or older, have your blood pressure checked every year. You should have your blood pressure measured twice-once when you are at a hospital or clinic, and once when you are not at a hospital or clinic. Record the average of the two measurements. To check your blood pressure when you are not at a hospital or clinic, you can use: ? An automated blood pressure machine at a pharmacy. ? A home blood pressure monitor.  Talk to your health care provider about your target blood pressure.  If you are between 48-30 years old, ask  your health care provider if you should take aspirin to prevent heart disease.  Have regular diabetes screenings by checking your fasting blood sugar level. ? If you are at a normal weight and have a low risk for diabetes, have this test once every three years after the age of 75. ? If you are overweight and have a high risk for diabetes, consider being tested at a younger age or more often.  A one-time screening for abdominal aortic aneurysm (AAA) by ultrasound is recommended for men aged 3-75 years who are current or former smokers. What should I know about preventing infection? Hepatitis B If you have a higher risk for hepatitis B, you should be screened for this virus. Talk with your health care provider to find out if you are at risk for hepatitis B infection. Hepatitis C Blood testing is recommended for:  Everyone born from [birth year] through 1967.  Anyone with known risk factors for hepatitis C.  Sexually Transmitted Diseases (STDs)  You should be screened each year for STDs including gonorrhea and chlamydia if: ? You are sexually active and are younger than 69 years of age. ? You are older than 69 years of age and your health care provider tells you that you are at risk for this type of infection. ? Your sexual activity has changed since you were last screened and you are at an increased risk for chlamydia or gonorrhea. Ask your health care provider if you are at risk.  Talk with your health care provider about whether you are at high risk of being infected with HIV. Your health care provider may recommend a prescription medicine to help prevent HIV infection.  What else can I do?  Schedule regular health, dental, and eye exams.  Stay current with your vaccines (immunizations).  Do not use any tobacco products, such as cigarettes, chewing tobacco, and e-cigarettes. If you need help quitting, ask your health care provider.  Limit alcohol intake to no more than 2 drinks per day.  One drink equals 12 ounces of beer, 5 ounces of wine, or 1 ounces of hard liquor.  Do not use street drugs.  Do not share needles.  Ask your health care provider for help if you need support or information about quitting drugs.  Tell your health care provider if you often feel depressed.  Tell your health care provider if you have ever been abused or do not feel safe at home. This information is not intended to replace advice given to you by your health care provider. Make sure you discuss any questions you have with your health care provider. Document Released: 06/13/2008 Document Revised: 08/14/2016 Document Reviewed: 09/19/2015 Elsevier Interactive Patient Education  [employer].

## 2018-07-22 ENCOUNTER — Encounter: Payer: Self-pay | Admitting: Internal Medicine

## 2018-07-23 NOTE — Telephone Encounter (Signed)
Please advise Dr Panosh, thanks.   

## 2018-07-23 NOTE — Telephone Encounter (Signed)
Chemistry was done and  Normal as of June 2019   So no need to repeat just yet unless  If   Med changed since then   If so then  order bmp in  Future labs   Dx ht and med management   Lab Results  Component Value Date   WBC 7.4 06/25/2018   HGB 16.1 06/25/2018   HCT 46.4 06/25/2018   PLT 132.0 (L) 06/25/2018   GLUCOSE 124 (H) 06/25/2018   CHOL 91 06/25/2018   TRIG 158.0 (H) 06/25/2018   HDL 30.60 (L) 06/25/2018   LDLCALC 29 06/25/2018   ALT 57 (H) 06/25/2018   AST 32 06/25/2018   NA 137 06/25/2018   K 4.3 06/25/2018   CL 105 06/25/2018   CREATININE 1.01 06/25/2018   BUN 18 06/25/2018   CO2 24 06/25/2018   TSH 1.91 06/25/2018   PSA 0.93 06/25/2018   INR 1.31 12/16/2016   HGBA1C 7.0 (H) 06/02/2018   MICROALBUR 9.3 (H) 06/25/2018

## 2018-07-24 NOTE — Progress Notes (Signed)
HPI: FU hx of CAD s/p stent to LAD in 2003, parox AFib, HL, OSA. He has seen Dr. Rayann Heman and ablation could be considered if he lost weight. Previous rash with cardizem; on tikosyn. Echo 6/17 showed normal function; grade 1 DD; mild LAE. Has had previous cardioversions for atrial fibrillation. 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 Followed by vascular surgery.  Carotid Dopplers June 2019 showed 1 to 39% bilateral stenosis.  Had episode of atrial fibrillation April 2019 and converted spontaneously.  Since last seen,there is no dyspnea, chest pain, palpitations or syncope.  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  Current Outpatient Medications  Medication Sig Dispense Refill  . acetaminophen (TYLENOL) 325 MG tablet Take 650 mg by mouth every 6 (six) hours as needed for mild pain.    [name] acetaZOLAMIDE (DIAMOX) 250 MG tablet Take 1-2 tablets (250-500 mg total) by mouth 2 (two) times daily. 60 tablet 6  . albuterol (PROVENTIL HFA;VENTOLIN HFA) 108 (90 Base) MCG/ACT inhaler Inhale 2 puffs into the lungs every 6 (six) hours as needed for wheezing or shortness of breath. 1 Inhaler 0  . ALPHAGAN P 0.1 % SOLN INSTILL 1 DROP INTO RIGHT EYE 3 TIMES A DAY  3  . atorvastatin (LIPITOR) 80 MG tablet TAKE 1 TABLET BY MOUTH  DAILY 90 tablet 1  . BELSOMRA 20 MG TABS TAKE 1 TABLET BY MOUTH AT  BEDTIME 90 tablet 0  . diphenhydrAMINE (BENADRYL) 25 MG tablet Take 25 mg by mouth at bedtime as needed for sleep.    [name] dofetilide (TIKOSYN) 500 MCG capsule TAKE 1 CAPSULE BY MOUTH TWO TIMES DAILY 180 capsule 3  . famotidine (PEPCID) 10 MG tablet Take 10 mg by mouth as needed for heartburn.     . fish oil-omega-3 fatty acids 1000 MG capsule Take 1 g by mouth 2 (two) times daily.     . fluticasone (FLONASE) 50 MCG/ACT nasal spray Place 2 sprays into both nostrils daily. (Patient taking differently: Place 2 sprays into both nostrils as needed. ) 16 g 6  .  KLOR-CON M20 20 MEQ tablet TAKE 1 TABLET BY MOUTH EVERY DAY 90 tablet 3  . Magnesium Oxide 500 MG TABS Take 750 mg by mouth daily.    . metFORMIN (GLUCOPHAGE-XR) 500 MG 24 hr tablet TAKE 3 TABLETS BY MOUTH  DAILY WITH BREAKFAST 270 tablet 0  . MULTIPLE VITAMIN PO Take 1 tablet by mouth every evening.     . nitroGLYCERIN (NITROSTAT) 0.4 MG SL tablet Place 1 tablet (0.4 mg total) under the tongue every 5 (five) minutes as needed for chest pain. 25 tablet 12  . ONETOUCH VERIO test strip USE TWICE A DAY 50 each 12  . PRADAXA 150 MG CAPS capsule TAKE 1 CAPSULE BY MOUTH  EVERY 12 HOURS 180 capsule 1  . traMADol (ULTRAM) 50 MG tablet Take 1-2 tablets (50-100 mg total) by mouth 3 (three) times daily as needed. (Patient not taking: Reported on 07/27/2018) 60 tablet 0   Current Facility-Administered Medications  Medication Dose Route Frequency Provider Last Rate Last Dose  . ipratropium-albuterol (DUONEB) 0.5-2.5 (3) MG/3ML nebulizer solution 3 mL  3 mL Nebulization Q6H Panosh, Standley Brooking, MD   3 mL at 01/02/18 1545     Past Medical History:  Diagnosis Date  . Atrial fibrillation ([place]) 03/22/2009   a. s/p multiple DCCV; b. no coumadin due to low TE risk profile; c.  Tikosyn Rx  . Coronary atherosclerosis of native coronary artery 11/2002   a. s/p stent to LAD 12/03; OM2 occluded at cath 12/03; d. myoview 5/10: no ischemia;  e. echo 7/11: EF 55%, BAE, mild RVE, PASP 41-45; Myoview was in March 2013. There was no ischemia or infarction, EF 51%   . Cutaneous abscess of back excluding buttocks 07/04/2014   Appears to stem from possibly a cyst very large area 6 cm contact surgeon office   . Diabetes mellitus without complication ([place])   . Drusen body    see opth note  . ERECTILE DYSFUNCTION 03/22/2009  . GERD 03/22/2009  . HYPERGLYCEMIA 04/25/2010  . HYPERLIPIDEMIA 03/22/2009  . 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 LATERAL EPICONDYLITIS, LEFT 10/24/2009  . LIVER FUNCTION  TESTS, ABNORMAL 04/25/2010  . Local reaction to immunization 05/05/2012   minor resolving  zostavax   . Myocardial infarction ([place]) mi2003  . Numbness in left leg    foot related to back disease and surgery  . Obesity, unspecified 04/24/2009  . Perforated appendicitis with necrosis s/p open appendectomy 06/07/14 06/04/2014  . Renal cyst    Characterized by MRI as simple  . Ruptured suppurative appendicitis    2015   . SLEEP APNEA, OBSTRUCTIVE 03/22/2009   compliant with CPAP  . THROMBOCYTOPENIA 08/16/2010  . TOBACCO USE, QUIT 10/24/2009  . 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 Umbilical hernia     Past Surgical History:  Procedure Laterality Date  . APPENDECTOMY N/A 06/06/2014   Procedure: APPENDECTOMY;  Surgeon: Odis Hollingshead, MD;  Location: [facility];  Service: General;  Laterality: N/A;  . COLON SURGERY    . COLONOSCOPY  11/15/2011   Procedure: COLONOSCOPY;  Surgeon: Juanita Craver, MD;  Location: [facility];  Service: Endoscopy;  Laterality: N/A;  . COLONOSCOPY WITH PROPOFOL N/A 01/03/2017   Procedure: COLONOSCOPY WITH PROPOFOL;  Surgeon: Carol Ada, MD;  Location: [facility];  Service: Endoscopy;  Laterality: N/A;  . cyst on epiglottis  08/2002  . ESOPHAGOGASTRODUODENOSCOPY  11/15/2011   Procedure: ESOPHAGOGASTRODUODENOSCOPY (EGD);  Surgeon: Juanita Craver, MD;  Location: [facility];  Service: Endoscopy;  Laterality: N/A;  . LAPAROSCOPIC APPENDECTOMY N/A 06/06/2014   Procedure: APPENDECTOMY LAPAROSCOPIC attemted;  Surgeon: Odis Hollingshead, MD;  Location: [facility];  Service: General;  Laterality: N/A;  . LUMBAR DISC SURGERY     two  holes in spinalcovering  . stent     LAD DUS 2004  . surgery l4-l5   1998   ruptured x 3  . ulnar neuropathy    . UMBILICAL HERNIA REPAIR     mesh    Social History   Socioeconomic History  . Marital status: Married    Spouse name: Not on file  . Number of children: Not on file  . Years of education: Not on file  . Highest education level: Not on  file  Occupational History  . Not on file  Social Needs  . Financial resource strain: Not on file  . Food insecurity:    Worry: Not on file    Inability: Not on file  . Transportation needs:    Medical: Not on file    Non-medical: Not on file  Tobacco Use  . Smoking status: Former Smoker    Packs/day: 2.00    Years: 42.00    Pack years: 84.00    Types: Cigarettes    Last attempt to quit: 12/30/2008  Years since quitting: 9.5  . Smokeless tobacco: Never Used  . Tobacco comment: started at age 89; 1-2 ppd; quit 2010  Substance and Sexual Activity  . Alcohol use: No    Alcohol/week: 0.0 oz    Comment: rarely; maybe 1 beer a year  . Drug use: No  . Sexual activity: Yes  Lifestyle  . Physical activity:    Days per week: Not on file    Minutes per session: Not on file  . Stress: Not on file  Relationships  . Social connections:    Talks on phone: Not on file    Gets together: Not on file    Attends religious service: Not on file    Active member of club or organization: Not on file    Attends meetings of clubs or organizations: Not on file    Relationship status: Not on file  . Intimate partner violence:    Fear of current or ex partner: Not on file    Emotionally abused: Not on file    Physically abused: Not on file    Forced sexual activity: Not on file  Other Topics Concern  . Not on file  Social History Narrative   Retired paramedic   Regular exercise-yes not recently    Has children   Wife is overweight and has fibromyalgia and depression on disability doesn't go out much. Back surgery    Has older dog   Retired from [employer] 31 years of service now 7 years    Caffeine- minimal    Family History  Problem Relation Age of Onset  . Thyroid disease Mother   . Ovarian cancer Mother   . Breast cancer Mother   . Lung cancer Father   . Cancer Father     ROS: no fevers or chills, productive cough, hemoptysis, dysphasia, odynophagia, melena, hematochezia, dysuria,  hematuria, rash, seizure activity, orthopnea, PND, pedal edema, claudication. Remaining systems are negative.  Physical Exam: Well-developed obese in no acute distress.  Skin is warm and dry.  HEENT is normal.  Neck is supple.  Chest is clear to auscultation with normal expansion.  Cardiovascular exam is regular rate and rhythm.  Abdominal exam nontender or distended. No masses palpated. Extremities show no edema. neuro grossly intact  ECG-sinus bradycardia at a rate of 53.  No ST changes.  Personally reviewed  A/P  1 paroxysmal atrial fibrillation-patient is in sinus rhythm today.  Continue Tikosyn and Pradaxa.  If episodes become more frequent can consider referral for ablation.  He has lost approximately 80 pounds over the past 3 years and would likely now be a candidate. Check K and Mg.  2 coronary artery disease-continue statin.  He is not on aspirin given need for anticoagulation.  3 hyperlipidemia-continue statin.  4 history of iliac artery aneurysm-followed by vascular surgery.  5 morbid obesity-we discussed the importance of weight loss.  He has lost 80 pounds over the past 3 years.  [name], MD

## 2018-07-27 ENCOUNTER — Ambulatory Visit (INDEPENDENT_AMBULATORY_CARE_PROVIDER_SITE_OTHER): Payer: Medicare Other | Admitting: Cardiology

## 2018-07-27 ENCOUNTER — Encounter: Payer: Self-pay | Admitting: Cardiology

## 2018-07-27 VITALS — BP 136/74 | HR 53 | Ht 73.0 in | Wt 288.6 lb

## 2018-07-27 DIAGNOSIS — I2510 Atherosclerotic heart disease of native coronary artery without angina pectoris: Secondary | ICD-10-CM

## 2018-07-27 DIAGNOSIS — E7800 Pure hypercholesterolemia, unspecified: Secondary | ICD-10-CM

## 2018-07-27 DIAGNOSIS — I480 Paroxysmal atrial fibrillation: Secondary | ICD-10-CM | POA: Diagnosis not present

## 2018-07-27 NOTE — Patient Instructions (Signed)

## 2018-07-28 ENCOUNTER — Encounter: Payer: Self-pay | Admitting: Internal Medicine

## 2018-07-28 LAB — BASIC METABOLIC PANEL
BUN/Creatinine Ratio: 21 (ref 10–24)
BUN: 22 mg/dL (ref 8–27)
CO2: 20 mmol/L (ref 20–29)
Calcium: 9.7 mg/dL (ref 8.6–10.2)
Chloride: 106 mmol/L (ref 96–106)
Creatinine, Ser: 1.05 mg/dL (ref 0.76–1.27)
GFR calc Af Amer: 84 mL/min/{1.73_m2} (ref >59)
GFR calc non Af Amer: 73 mL/min/{1.73_m2} (ref >59)
Glucose: 117 mg/dL — ABNORMAL HIGH (ref 65–99)
Potassium: 4.4 mmol/L (ref 3.5–5.2)
Sodium: 141 mmol/L (ref 134–144)

## 2018-07-28 LAB — MAGNESIUM: Magnesium: 2 mg/dL (ref 1.6–2.3)

## 2018-07-28 LAB — HM DIABETES EYE EXAM

## 2018-07-30 ENCOUNTER — Emergency Department (HOSPITAL_COMMUNITY): Payer: Medicare Other

## 2018-07-30 ENCOUNTER — Emergency Department (HOSPITAL_COMMUNITY)
Admission: EM | Admit: 2018-07-30 | Discharge: 2018-07-30 | Disposition: A | Discharge status: Home / Self Care | Payer: Medicare Other | Attending: Emergency Medicine | Admitting: Emergency Medicine

## 2018-07-30 ENCOUNTER — Other Ambulatory Visit: Payer: Self-pay

## 2018-07-30 DIAGNOSIS — Z7984 Long term (current) use of oral hypoglycemic drugs: Secondary | ICD-10-CM | POA: Diagnosis not present

## 2018-07-30 DIAGNOSIS — R1012 Left upper quadrant pain: Secondary | ICD-10-CM | POA: Diagnosis present

## 2018-07-30 DIAGNOSIS — Z79899 Other long term (current) drug therapy: Secondary | ICD-10-CM | POA: Insufficient documentation

## 2018-07-30 DIAGNOSIS — N201 Calculus of ureter: Secondary | ICD-10-CM

## 2018-07-30 DIAGNOSIS — E119 Type 2 diabetes mellitus without complications: Secondary | ICD-10-CM | POA: Diagnosis not present

## 2018-07-30 DIAGNOSIS — I2510 Atherosclerotic heart disease of native coronary artery without angina pectoris: Secondary | ICD-10-CM | POA: Insufficient documentation

## 2018-07-30 LAB — COMPREHENSIVE METABOLIC PANEL
ALT: 65 U/L — ABNORMAL HIGH (ref 0–44)
AST: 32 U/L (ref 15–41)
Albumin: 4 g/dL (ref 3.5–5.0)
Alkaline Phosphatase: 59 U/L (ref 38–126)
Anion gap: 8 (ref 5–15)
BUN: 23 mg/dL (ref 8–23)
CO2: 21 mmol/L — ABNORMAL LOW (ref 22–32)
Calcium: 9.6 mg/dL (ref 8.9–10.3)
Chloride: 111 mmol/L (ref 98–111)
Creatinine, Ser: 1.2 mg/dL (ref 0.61–1.24)
GFR calc Af Amer: >60 mL/min (ref >60)
GFR calc non Af Amer: >60 mL/min (ref >60)
Glucose, Bld: 152 mg/dL — ABNORMAL HIGH (ref 70–99)
Potassium: 4.1 mmol/L (ref 3.5–5.1)
Sodium: 140 mmol/L (ref 135–145)
Total Bilirubin: 0.9 mg/dL (ref 0.3–1.2)
Total Protein: 7 g/dL (ref 6.5–8.1)

## 2018-07-30 LAB — URINALYSIS, ROUTINE W REFLEX MICROSCOPIC
Bilirubin Urine: NEGATIVE
Glucose, UA: NEGATIVE mg/dL
Ketones, ur: NEGATIVE mg/dL
Leukocytes, UA: NEGATIVE
Nitrite: NEGATIVE
Protein, ur: NEGATIVE mg/dL
RBC / HPF: >50 RBC/hpf — ABNORMAL HIGH (ref 0–5)
Specific Gravity, Urine: 1.016 (ref 1.005–1.030)
pH: 7 (ref 5.0–8.0)

## 2018-07-30 LAB — CBC
HCT: 47.9 % (ref 39.0–52.0)
Hemoglobin: 15.5 g/dL (ref 13.0–17.0)
MCH: 30.5 pg (ref 26.0–34.0)
MCHC: 32.4 g/dL (ref 30.0–36.0)
MCV: 94.1 fL (ref 78.0–100.0)
Platelets: 144 10*3/uL — ABNORMAL LOW (ref 150–400)
RBC: 5.09 MIL/uL (ref 4.22–5.81)
RDW: 13.1 % (ref 11.5–15.5)
WBC: 9.6 10*3/uL (ref 4.0–10.5)

## 2018-07-30 LAB — LIPASE, BLOOD: Lipase: 55 U/L — ABNORMAL HIGH (ref 11–51)

## 2018-07-30 MED ORDER — TAMSULOSIN HCL 0.4 MG PO CAPS: 0.4000 mg | ORAL_CAPSULE | Freq: Every day | ORAL | 0 refills | Status: DC

## 2018-07-30 MED ORDER — TAMSULOSIN HCL 0.4 MG PO CAPS: 0.4000 mg | ORAL_CAPSULE | Freq: Every day | ORAL | Status: DC

## 2018-07-30 MED ORDER — HYDROMORPHONE HCL 1 MG/ML IJ SOLN
1.0000 mg | Freq: Once | INTRAMUSCULAR | Status: AC
  Administered 2018-07-30: 1 mg via INTRAVENOUS
  Filled 2018-07-30: qty 1

## 2018-07-30 MED ORDER — ONDANSETRON HCL 4 MG/2ML IJ SOLN: 4.0000 mg | Freq: Once | INTRAMUSCULAR | Status: DC | PRN

## 2018-07-30 MED ORDER — ONDANSETRON 4 MG PO TBDP: 4.0000 mg | ORAL_TABLET | Freq: Three times a day (TID) | ORAL | 0 refills | Status: DC | PRN

## 2018-07-30 MED ORDER — ONDANSETRON HCL 4 MG/2ML IJ SOLN
4.0000 mg | Freq: Once | INTRAMUSCULAR | Status: AC
  Administered 2018-07-30: 4 mg via INTRAVENOUS
  Filled 2018-07-30: qty 2

## 2018-07-30 MED ORDER — MORPHINE SULFATE (PF) 4 MG/ML IV SOLN: 4.0000 mg | Freq: Once | INTRAVENOUS | Status: DC

## 2018-07-30 MED ORDER — IOHEXOL 300 MG/ML  SOLN
100.0000 mL | Freq: Once | INTRAMUSCULAR | Status: AC | PRN
  Administered 2018-07-30: 100 mL via INTRAVENOUS

## 2018-07-30 MED ORDER — OXYCODONE HCL 5 MG PO TABS: 5.0000 mg | ORAL_TABLET | ORAL | 0 refills | Status: AC | PRN

## 2018-07-30 NOTE — ED Notes (Signed)
Provider aware of patient request for pain medication.

## 2018-07-30 NOTE — Discharge Instructions (Addendum)
Thank you for allowing me to care for you today in the Emergency Department.   Your work-up showed that you have a kidney stone in your left ureter.  Take 1 tablet of Flomax daily.  Your first dose was given here.  Take 650 mg of Tylenol every 6 hours for pain control.  For severe pain, you can take 1 tablet of oxycodone every 4 hours as needed.  Do not work or drive while taking this medication because it can cause you to be impaired.  Let 1 tablet of Zofran dissolve in your tongue every  8 hours as needed for nausea or vomiting.  Your prescriptions have been sent to your pharmacy.  You can use the urine strainer to catch the stone when it passes.  If you do not pass the stone in the next 2 to 3 days, you can follow-up with urology in the clinic.  Return to the emergency department if you develop uncontrollable pain, persistent vomiting despite taking Zofran, high fever, or new, worsening symptoms.

## 2018-07-30 NOTE — ED Triage Notes (Signed)
Patient c/o abdominal pain (Left upper quad) that began this a.m. @ 03:00

## 2018-07-30 NOTE — ED Notes (Signed)
Patient came out of room demanding to be discharged. When asked to go back into the room, patient refused. When asked to wait for PO meds, patient stated "I'm ready to go now". Patient discharged and refused to sign anything.

## 2018-07-30 NOTE — ED Provider Notes (Signed)
[facility] EMERGENCY DEPARTMENT Provider Note   CSN: [id number] Arrival date & time: 07/30/18  9242     History   Chief Complaint Chief Complaint  Patient presents with  . Abdominal Pain    HPI Mamadou Breon. is a 67 y.o. male with a history of A. fib S/P cardioversion, MI, CAD s/p PCA, DM type II, HLD, GERD presents to the emergency department with a chief complaint of abdominal pain.  The patient endorses left upper quadrant abdominal pain that began shortly after having the urge to have a bowel movement around 3 AM.  He characterizes the pain as stabbing.  Pain is constant.  Nonradiating.  He reports multiple episodes of loose, nonbloody stools, nausea, and nonbloody, nonbilious emesis.  He reports his nausea significantly worsened when he laid on his left side.  No known alleviating factors.  No treatment prior to arrival.  He denies chest pain, dyspnea, dysuria, hematuria, constipation, penile or testicular pain or swelling.  He reports that he has not had to strain recently to have a bowel movement.  Past surgical history includes open appendectomy in 2015 secondary to perforated appendicitis with necrosis and umbilical hernia repair in 2010.   The history is provided by the patient. No language interpreter was used.    Past Medical History:  Diagnosis Date  . Atrial fibrillation ([place]) 03/22/2009   a. s/p multiple DCCV; b. no coumadin due to low TE risk profile; c. Tikosyn Rx  . Coronary atherosclerosis of native coronary artery 11/2002   a. s/p stent to LAD 12/03; OM2 occluded at cath 12/03; d. myoview 5/10: no ischemia;  e. echo 7/11: EF 55%, BAE, mild RVE, PASP 41-45; Myoview was in March 2013. There was no ischemia or infarction, EF 51%   . Cutaneous abscess of back excluding buttocks 07/04/2014   Appears to stem from possibly a cyst very large area 6 cm contact surgeon office   . Diabetes mellitus without complication ([place])   . Drusen body    see  opth note  . ERECTILE DYSFUNCTION 03/22/2009  . GERD 03/22/2009  . HYPERGLYCEMIA 04/25/2010  . HYPERLIPIDEMIA 03/22/2009  . 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 LATERAL EPICONDYLITIS, LEFT 10/24/2009  . LIVER FUNCTION TESTS, ABNORMAL 04/25/2010  . Local reaction to immunization 05/05/2012   minor resolving  zostavax   . Myocardial infarction ([place]) mi2003  . Numbness in left leg    foot related to back disease and surgery  . Obesity, unspecified 04/24/2009  . Perforated appendicitis with necrosis s/p open appendectomy 06/07/14 06/04/2014  . Renal cyst    Characterized by MRI as simple  . Ruptured suppurative appendicitis    2015   . SLEEP APNEA, OBSTRUCTIVE 03/22/2009   compliant with CPAP  . THROMBOCYTOPENIA 08/16/2010  . TOBACCO USE, QUIT 10/24/2009  . 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 Umbilical hernia     Patient Active Problem List   Diagnosis Date Noted  . Acute back pain with sciatica, right 09/04/2017  . Right hip pain 09/04/2017  . Thrombocytopenia ([place]) 06/25/2016  . Atrial fibrillation ([place]) 01/01/2016  . Iliac aneurysm ([place])   . Morbid obesity ([place]) 07/08/2014  . Type 2 diabetes mellitus with other circulatory complications ([place]) 68/34/1962  . Chronic anticoagulation 12/21/2013  . Sleep disorder 06/21/2013  . Decreased hearing 08/06/2012  . Hemorrhoids 08/06/2012  . High risk medication use 05/05/2012  . Anticoagulation management encounter 05/05/2012  .  Retinal tear 04/29/2012  . Personal history of colonic polyps 05/05/2011  . Visit for preventive health examination 05/05/2011  . PALPITATIONS 08/16/2010  . LIVER FUNCTION TESTS, ABNORMAL 04/25/2010  . Lateral epicondylitis 10/24/2009  . TOBACCO USE, QUIT 10/24/2009  . OBESITY, UNSPECIFIED 04/24/2009  . Hyperlipidemia 03/22/2009  . ERECTILE DYSFUNCTION 03/22/2009  . Obstructive sleep apnea 03/22/2009  . 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 MYOCARDIAL INFARCTION, HX OF 03/22/2009    . Coronary atherosclerosis 03/22/2009  . Atrial fibrillation with RVR ([place]) 03/22/2009  . GERD 03/22/2009    Past Surgical History:  Procedure Laterality Date  . APPENDECTOMY N/A 06/06/2014   Procedure: APPENDECTOMY;  Surgeon: Odis Hollingshead, MD;  Location: [facility];  Service: General;  Laterality: N/A;  . COLON SURGERY    . COLONOSCOPY  11/15/2011   Procedure: COLONOSCOPY;  Surgeon: Juanita Craver, MD;  Location: [facility];  Service: Endoscopy;  Laterality: N/A;  . COLONOSCOPY WITH PROPOFOL N/A 01/03/2017   Procedure: COLONOSCOPY WITH PROPOFOL;  Surgeon: Carol Ada, MD;  Location: [facility];  Service: Endoscopy;  Laterality: N/A;  . cyst on epiglottis  08/2002  . ESOPHAGOGASTRODUODENOSCOPY  11/15/2011   Procedure: ESOPHAGOGASTRODUODENOSCOPY (EGD);  Surgeon: Juanita Craver, MD;  Location: [facility];  Service: Endoscopy;  Laterality: N/A;  . LAPAROSCOPIC APPENDECTOMY N/A 06/06/2014   Procedure: APPENDECTOMY LAPAROSCOPIC attemted;  Surgeon: Odis Hollingshead, MD;  Location: [facility];  Service: General;  Laterality: N/A;  . LUMBAR DISC SURGERY     two  holes in spinalcovering  . stent     LAD DUS 2004  . surgery l4-l5   1998   ruptured x 3  . ulnar neuropathy    . UMBILICAL HERNIA REPAIR     mesh        Home Medications    Prior to Admission medications   Medication Sig Start Date End Date Taking? Authorizing Provider  acetaminophen (TYLENOL) 325 MG tablet Take 650 mg by mouth every 6 (six) hours as needed for mild pain.   Yes [provider]  acetaZOLAMIDE (DIAMOX) 250 MG tablet Take 1-2 tablets (250-500 mg total) by mouth 2 (two) times daily. Patient taking differently: Take 250-500 mg by mouth See admin instructions. Take 500 mg in the morning and 250 mg in the evening for 3 days. Then started take 250 mg twice daily. 06/23/18  Yes Penumalli, Earlean Polka, MD  albuterol (PROVENTIL HFA;VENTOLIN HFA) 108 (90 Base) MCG/ACT inhaler Inhale 2 puffs into the lungs every 6 (six)  hours as needed for wheezing or shortness of breath. 03/24/17  Yes Panosh, Standley Brooking, MD  ALPHAGAN P 0.1 % SOLN INSTILL 1 DROP INTO RIGHT EYE 3 TIMES A DAY 07/20/18  Yes [provider]  atorvastatin (LIPITOR) 80 MG tablet TAKE 1 TABLET BY MOUTH  DAILY 05/04/18  Yes Panosh, Standley Brooking, MD  BELSOMRA 20 MG TABS TAKE 1 TABLET BY MOUTH AT  BEDTIME 06/17/18  Yes Panosh, Standley Brooking, MD  diphenhydrAMINE (BENADRYL) 25 MG tablet Take 25 mg by mouth at bedtime as needed for sleep.   Yes [provider]  dofetilide (TIKOSYN) 500 MCG capsule TAKE 1 CAPSULE BY MOUTH TWO TIMES DAILY 03/16/18  Yes Lelon Perla, MD  famotidine (PEPCID) 10 MG tablet Take 10 mg by mouth as needed for heartburn.    Yes [provider]  fish oil-omega-3 fatty acids 1000 MG capsule Take 1 g by mouth 2 (two) times daily.    Yes [provider]  fluticasone Asencion Islam)  50 MCG/ACT nasal spray Place 2 sprays into both nostrils daily. Patient taking differently: Place 2 sprays into both nostrils daily as needed for allergies.  04/13/18  Yes Panosh, Standley Brooking, MD  KLOR-CON M20 20 MEQ tablet TAKE 1 TABLET BY MOUTH EVERY DAY 06/16/18  Yes Lelon Perla, MD  Magnesium Oxide 500 MG TABS Take 750 mg by mouth daily.   Yes [provider]  metFORMIN (GLUCOPHAGE-XR) 500 MG 24 hr tablet TAKE 3 TABLETS BY MOUTH  DAILY WITH BREAKFAST Patient taking differently: TAKE 1 TABLETS BY MOUTH  THREE TIMES DAILY. 06/17/18  Yes Panosh, Standley Brooking, MD  MULTIPLE VITAMIN PO Take 1 tablet by mouth every evening.    Yes [provider]  nitroGLYCERIN (NITROSTAT) 0.4 MG SL tablet Place 1 tablet (0.4 mg total) under the tongue every 5 (five) minutes as needed for chest pain. 11/18/16  Yes Crenshaw, Denice Bors, MD  ONETOUCH VERIO test strip USE TWICE A DAY 04/21/18  Yes Panosh, Standley Brooking, MD  PRADAXA 150 MG CAPS capsule TAKE 1 CAPSULE BY MOUTH  EVERY 12 HOURS 05/04/18  Yes Panosh, Standley Brooking, MD  ondansetron (ZOFRAN ODT) 4 MG disintegrating  tablet Take 1 tablet (4 mg total) by mouth every 8 (eight) hours as needed for nausea or vomiting. 07/30/18   Emmalou Hunger A, PA-C  oxyCODONE (ROXICODONE) 5 MG immediate release tablet Take 1 tablet (5 mg total) by mouth every 4 (four) hours as needed for up to 3 days for severe pain. 07/30/18 08/02/18  Leaann Nevils A, PA-C  tamsulosin (FLOMAX) 0.4 MG CAPS capsule Take 1 capsule (0.4 mg total) by mouth daily. 07/30/18   Akaya Proffit A, PA-C    Family History Family History  Problem Relation Age of Onset  . Thyroid disease Mother   . Ovarian cancer Mother   . Breast cancer Mother   . Lung cancer Father   . Cancer Father     Social History Social History   Tobacco Use  . Smoking status: Former Smoker    Packs/day: 2.00    Years: 42.00    Pack years: 84.00    Types: Cigarettes    Last attempt to quit: 12/30/2008    Years since quitting: 9.5  . Smokeless tobacco: Never Used  . Tobacco comment: started at age 57; 1-2 ppd; quit 2010  Substance Use Topics  . Alcohol use: No    Alcohol/week: 0.0 oz    Comment: rarely; maybe 1 beer a year  . Drug use: No     Allergies   Penicillins; Procaine hcl; Pseudoephedrine; Sulfonamide derivatives; and Cardizem [diltiazem]   Review of Systems Review of Systems  Constitutional: Negative for appetite change, chills and fever.  Respiratory: Negative for shortness of breath.   Cardiovascular: Negative for chest pain.  Gastrointestinal: Positive for abdominal pain, diarrhea, nausea and vomiting. Negative for blood in stool and constipation.  Genitourinary: Negative for dysuria, flank pain, hematuria, penile pain, penile swelling, scrotal swelling, testicular pain and urgency.  Musculoskeletal: Negative for back pain.  Skin: Negative for rash.  Allergic/Immunologic: Negative for immunocompromised state.  Neurological: Negative for headaches.  Psychiatric/Behavioral: Negative for confusion.     Physical Exam Updated Vital Signs BP 113/62    Pulse 74   Temp 98.3 F (36.8 C) (Oral)   Resp 11   SpO2 98%   Physical Exam  Constitutional: He appears well-developed.  HENT:  Head: Normocephalic.  Eyes: Conjunctivae are normal.  Neck: Neck supple.  Cardiovascular: Normal rate  and regular rhythm. Exam reveals no gallop and no friction rub.  No murmur heard. Pulmonary/Chest: Effort normal and breath sounds normal. No stridor. No respiratory distress. He has no wheezes. He has no rales. He exhibits no tenderness.  Abdominal: Soft. He exhibits no distension and no mass. There is tenderness. There is no rebound and no guarding. No hernia.  Obese abdomen.  Focal tenderness to palpation in the left upper quadrant.  No rebound or guarding.  Abdomen is mildly distended, but soft.  No CVA tenderness bilaterally.   Neurological: He is alert.  Skin: Skin is warm and dry. Capillary refill takes less than 2 seconds.  Psychiatric: His behavior is normal.  Nursing note and vitals reviewed.  ED Treatments / Results  Labs (all labs ordered are listed, but only abnormal results are displayed) Labs Reviewed  LIPASE, BLOOD - Abnormal; Notable for the following components:      Result Value   Lipase 55 (*)    All other components within normal limits  COMPREHENSIVE METABOLIC PANEL - Abnormal; Notable for the following components:   CO2 21 (*)    Glucose, Bld 152 (*)    ALT 65 (*)    All other components within normal limits  CBC - Abnormal; Notable for the following components:   Platelets 144 (*)    All other components within normal limits  URINALYSIS, ROUTINE W REFLEX MICROSCOPIC - Abnormal; Notable for the following components:   APPearance CLOUDY (*)    Hgb urine dipstick MODERATE (*)    RBC / HPF >50 (*)    Bacteria, UA FEW (*)    All other components within normal limits    EKG None  Radiology Ct Abdomen Pelvis W Contrast  Result Date: 07/30/2018 CLINICAL DATA:  LEFT upper quadrant abdominal pain beginning this morning, past  history of perforated appendicitis in 2015, former smoker, coronary artery disease post MI and PTCA, type II diabetes mellitus EXAM: CT ABDOMEN AND PELVIS WITH CONTRAST TECHNIQUE: Multidetector CT imaging of the abdomen and pelvis was performed using the standard protocol following bolus administration of intravenous contrast. Sagittal and coronal MPR images reconstructed from axial data set. CONTRAST:  166mL OMNIPAQUE IOHEXOL 300 MG/ML SOLN IV. No oral contrast was administered COMPARISON:  08/07/2016 FINDINGS: Lower chest: Scattered bibasilar atelectasis. Nodular pleural thickening lateral RIGHT middle lobe 11 mm greatest diameter image 7 unchanged. Hepatobiliary: Calcified 2.2 cm diameter gallstone within gallbladder. No obvious gallbladder wall thickening or pericholecystic infiltration. Liver unremarkable. No biliary dilatation. Pancreas: Normal appearance Spleen: Normal appearance Adrenals/Urinary Tract: Adrenal glands normal appearance. BILATERAL nonobstructing renal calculi. Two RIGHT renal cysts larger measuring 5.1 x 4.4 cm slight delay in LEFT nephrogram and mild collecting system dilatation. LEFT ureter is minimally prominent extending to a tiny calculus at the LEFT ureterovesical junction. Bladder is decompressed with an additional tiny dependent calculus. No RIGHT hydronephrosis or hydroureter. Stomach/Bowel: Descending colonic diverticulosis. Appendix surgically absent. Stomach inadequately distended, unable to assess wall thickness. Bowel loops otherwise grossly unremarkable. Vascular/Lymphatic: Atherosclerotic calcifications aorta and coronary arteries. Aorta normal caliber. Mitral annular calcification. Cardiac chambers appear mildly enlarged. No adenopathy. Reproductive: Mild prostatic enlargement gland 5.1 x 4.3 cm image 85. Other: No free air or free fluid. No definite hernia or acute inflammatory process. Musculoskeletal: Degenerative disc disease changes of the thoracolumbar spine. IMPRESSION:  Mild LEFT hydronephrosis and hydroureter secondary to a tiny LEFT ureterovesical junction calculus with associated mild delay in LEFT nephrogram. Additional BILATERAL nonobstructing renal calculi and tiny bladder calculus. Cholelithiasis.  Descending colonic diverticulosis. Small RIGHT renal cysts. Prostatic enlargement. Aortic Atherosclerosis (ICD10-I70.0). Electronically Signed   By: Lavonia Dana M.D.   On: 07/30/2018 08:57    Procedures Procedures (including critical care time)  Medications Ordered in ED Medications  tamsulosin (FLOMAX) capsule 0.4 mg (has no administration in time range)  ondansetron (ZOFRAN) injection 4 mg (4 mg Intravenous Given 07/30/18 0741)  HYDROmorphone (DILAUDID) injection 1 mg (1 mg Intravenous Given 07/30/18 0740)  iohexol (OMNIPAQUE) 300 MG/ML solution 100 mL (100 mLs Intravenous Contrast Given 07/30/18 0829)  HYDROmorphone (DILAUDID) injection 1 mg (1 mg Intravenous Given 07/30/18 1010)     Initial Impression / Assessment and Plan / ED Course  I have reviewed the triage vital signs and the nursing notes.  Pertinent labs & imaging results that were available during my care of the patient were reviewed by me and considered in my medical decision making (see chart for details).     67 year old male with a history of A. fib S/P cardioversion, MI, CAD s/p PCA, DM type II, HLD, GERD resenting with sudden onset left upper quadrant pain, nausea, vomiting, diarrhea, onset less than 24 hours ago.  Labs are notable for minimally elevated lipase at 55 and 2-1 ratio of AST to ALT, which appears to be chronic on review.  I suspect secondary to alcohol use.  UA with RBCs, hemoglobinuria, and amorphous crystals.  CT scan demonstrating a small stone at the ureterovesical junction and mild left hydronephrosis.  Discussed these findings with the patient.  He reports his pain is markedly improved with Dilaudid.  Toradol avoided given the patient's creatinine and history of CAD.  He was  successfully fluid challenged in the ED.  Urine strainer given.  We will discharge the patient to home with oxycodone for pain control, Zofran, and Flomax.  He was previously established with [facility] and can follow-up in their clinic.  The patient was discussed with Dr. Lita Mains, attending physician.  Strict return precautions given.  He is hemodynamically stable and in no acute distress.  He is safe for discharge home at this time.  Final Clinical Impressions(s) / ED Diagnoses   Final diagnoses:  Ureterolithiasis    ED Discharge Orders        Ordered    oxyCODONE (ROXICODONE) 5 MG immediate release tablet  Every 4 hours PRN     07/30/18 1219    ondansetron (ZOFRAN ODT) 4 MG disintegrating tablet  Every 8 hours PRN     07/30/18 1219    tamsulosin (FLOMAX) 0.4 MG CAPS capsule  Daily     07/30/18 1219       Joline Maxcy A, PA-C 07/30/18 1226    Julianne Rice, MD 08/01/18 1558

## 2018-08-11 ENCOUNTER — Ambulatory Visit (INDEPENDENT_AMBULATORY_CARE_PROVIDER_SITE_OTHER): Payer: Medicare Other

## 2018-08-11 VITALS — BP 130/80 | HR 64 | Ht 73.0 in | Wt 290.0 lb

## 2018-08-11 DIAGNOSIS — Z0000 Encounter for general adult medical examination without abnormal findings: Secondary | ICD-10-CM

## 2018-08-11 NOTE — Progress Notes (Addendum)
Subjective:   [name] [name]. is a 67 y.o. male who presents for Medicare Annual/Subsequent preventive examination.  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 Leta Baptist is doing a CT of the lung    Health Maintenance Due  Topic Date Due  . INFLUENZA VACCINE  07/30/2018   Colonoscopy 01/03/2017 and repeat 2028  Smoker with quit date 12/2008 Has had Abd CT 07/2016  Aorta normal caliber but states he has iliac aneurysm 130 80    Cardiac Risk Factors include: advanced age (>72men, >80 women);diabetes mellitus;dyslipidemia;family history of premature cardiovascular disease;hypertension;male gender;obesity (BMI >30kg/m2);sedentary lifestyle     Objective:    Vitals: BP 130/80   Pulse 64   Ht 6\' 1"  (1.854 m)   Wt 290 lb (131.5 kg)   SpO2 92%   BMI 38.26 kg/m   Body mass index is 38.26 kg/m.  Advanced Directives 08/11/2018 07/30/2018 08/27/2017 08/08/2017 03/01/2017 01/03/2017 12/27/2016  Does Patient Have a Medical Advance  Directive? Yes No Yes Yes No Yes Yes  Type of Advance Directive - - [place];Living will - - [place];Living will [place];Living will  Does patient want to make changes to medical advance directive? - - - - - - No - Patient declined  Copy of [place] in Chart? - - - - - No - copy requested No - copy requested  Would patient like information on creating a medical advance directive? - No - Patient declined - - - - -  Pre-existing out of facility DNR order (yellow form or pink MOST form) - - - - - - -    Tobacco Social History   Tobacco Use  Smoking Status Former Smoker  . Packs/day: 2.00  . Years: 42.00  . Pack years: 84.00  . Types: Cigarettes  . Last attempt to quit: 12/30/2008  . Years since quitting: 9.6  Smokeless Tobacco Never Used  Tobacco Comment   started at age 9; 1-2 ppd; quit 2010     Counseling given: Not Answered Comment: started at age 20; 1-2 ppd; quit 2010   Clinical Intake:     Past Medical History:  Diagnosis Date  . Atrial fibrillation ([place]) 03/22/2009   a. s/p multiple DCCV; b. no coumadin due to low TE risk profile; c. Tikosyn  Rx  . Coronary atherosclerosis of native coronary artery 11/2002   a. s/p stent to LAD 12/03; OM2 occluded at cath 12/03; d. myoview 5/10: no ischemia;  e. echo 7/11: EF 55%, BAE, mild RVE, PASP 41-45; Myoview was in March 2013. There was no ischemia or infarction, EF 51%   . Cutaneous abscess of back excluding buttocks 07/04/2014   Appears to stem from possibly a cyst very large area 6 cm contact surgeon office   . Diabetes mellitus without complication ([place])   . Drusen body    see opth note  . ERECTILE DYSFUNCTION 03/22/2009  . GERD 03/22/2009  . HYPERGLYCEMIA 04/25/2010  . HYPERLIPIDEMIA 03/22/2009  . 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 LATERAL EPICONDYLITIS, LEFT 10/24/2009  . LIVER FUNCTION TESTS, ABNORMAL  04/25/2010  . Local reaction to immunization 05/05/2012   minor resolving  zostavax   . Myocardial infarction ([place]) mi2003  . Numbness in left leg    foot related to back disease and surgery  . Obesity, unspecified 04/24/2009  . Perforated appendicitis with necrosis s/p open appendectomy 06/07/14 06/04/2014  . Renal cyst    Characterized by MRI as simple  . Ruptured suppurative appendicitis    2015   . SLEEP APNEA, OBSTRUCTIVE 03/22/2009   compliant with CPAP  . THROMBOCYTOPENIA 08/16/2010  . TOBACCO USE, QUIT 10/24/2009  . 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 Umbilical hernia    Past Surgical History:  Procedure Laterality Date  . APPENDECTOMY N/A 06/06/2014   Procedure: APPENDECTOMY;  Surgeon: Odis Hollingshead, MD;  Location: [facility];  Service: General;  Laterality: N/A;  . COLON SURGERY    . COLONOSCOPY  11/15/2011   Procedure: COLONOSCOPY;  Surgeon: Juanita Craver, MD;  Location: [facility];  Service: Endoscopy;  Laterality: N/A;  . COLONOSCOPY WITH PROPOFOL N/A 01/03/2017   Procedure: COLONOSCOPY WITH PROPOFOL;  Surgeon: Carol Ada, MD;  Location: [facility];  Service: Endoscopy;  Laterality: N/A;  . cyst on epiglottis  08/2002  . ESOPHAGOGASTRODUODENOSCOPY  11/15/2011   Procedure: ESOPHAGOGASTRODUODENOSCOPY (EGD);  Surgeon: Juanita Craver, MD;  Location: [facility];  Service: Endoscopy;  Laterality: N/A;  . LAPAROSCOPIC APPENDECTOMY N/A 06/06/2014   Procedure: APPENDECTOMY LAPAROSCOPIC attemted;  Surgeon: Odis Hollingshead, MD;  Location: [facility];  Service: General;  Laterality: N/A;  . LUMBAR DISC SURGERY     two  holes in spinalcovering  . stent     LAD DUS 2004  . surgery l4-l5   1998   ruptured x 3  . ulnar neuropathy    . UMBILICAL HERNIA REPAIR     mesh   Family History  Problem Relation Age of Onset  . Thyroid disease Mother   . Ovarian cancer Mother   . Breast cancer Mother   . Lung cancer Father   . Cancer Father    Social History   Socioeconomic History  .  Marital status: Married    Spouse name: Not on file  . Number of children: Not on file  . Years of education: Not on file  . Highest education level: Not on file  Occupational History  . Not on file  Social Needs  . Financial resource strain: Not on file  . Food insecurity:    Worry: Not on file    Inability: Not on file  . Transportation needs:    Medical: Not on file    Non-medical: Not on file  Tobacco Use  .  Smoking status: Former Smoker    Packs/day: 2.00    Years: 42.00    Pack years: 84.00    Types: Cigarettes    Last attempt to quit: 12/30/2008    Years since quitting: 9.6  . Smokeless tobacco: Never Used  . Tobacco comment: started at age 69; 1-2 ppd; quit 2010  Substance and Sexual Activity  . Alcohol use: No    Alcohol/week: 0.0 standard drinks    Comment: rarely; maybe 1 beer a year  . Drug use: No  . Sexual activity: Yes  Lifestyle  . Physical activity:    Days per week: Not on file    Minutes per session: Not on file  . Stress: Not on file  Relationships  . Social connections:    Talks on phone: Not on file    Gets together: Not on file    Attends religious service: Not on file    Active member of club or organization: Not on file    Attends meetings of clubs or organizations: Not on file    Relationship status: Not on file  Other Topics Concern  . Not on file  Social History Narrative   Retired paramedic   Regular exercise-yes not recently    Has children   Wife is overweight and has fibromyalgia and depression on disability doesn't go out much. Back surgery    Has older dog   Retired from [employer] years of service now 7 years    Caffeine- minimal    Outpatient Encounter Medications as of 08/11/2018  Medication Sig  . acetaminophen (TYLENOL) 325 MG tablet Take 650 mg by mouth every 6 (six) hours as needed for mild pain.  . ALPHAGAN P 0.1 % SOLN INSTILL 1 DROP INTO RIGHT EYE 3 TIMES A DAY  . atorvastatin (LIPITOR) 80 MG tablet TAKE 1 TABLET BY  MOUTH  DAILY  . BELSOMRA 20 MG TABS TAKE 1 TABLET BY MOUTH AT  BEDTIME  . diphenhydrAMINE (BENADRYL) 25 MG tablet Take 25 mg by mouth at bedtime as needed for sleep.  [name] dofetilide (TIKOSYN) 500 MCG capsule TAKE 1 CAPSULE BY MOUTH TWO TIMES DAILY  . famotidine (PEPCID) 10 MG tablet Take 10 mg by mouth as needed for heartburn.   . fish oil-omega-3 fatty acids 1000 MG capsule Take 1 g by mouth 2 (two) times daily.   . fluticasone (FLONASE) 50 MCG/ACT nasal spray Place 2 sprays into both nostrils daily. (Patient taking differently: Place 2 sprays into both nostrils daily as needed for allergies. )  . KLOR-CON M20 20 MEQ tablet TAKE 1 TABLET BY MOUTH EVERY DAY  . Magnesium Oxide 500 MG TABS Take 750 mg by mouth daily.  . metFORMIN (GLUCOPHAGE-XR) 500 MG 24 hr tablet TAKE 3 TABLETS BY MOUTH  DAILY WITH BREAKFAST (Patient taking differently: TAKE 1 TABLETS BY MOUTH  THREE TIMES DAILY.)  . MULTIPLE VITAMIN PO Take 1 tablet by mouth every evening.   . nitroGLYCERIN (NITROSTAT) 0.4 MG SL tablet Place 1 tablet (0.4 mg total) under the tongue every 5 (five) minutes as needed for chest pain.  [name] VERIO test strip USE TWICE A DAY  . PRADAXA 150 MG CAPS capsule TAKE 1 CAPSULE BY MOUTH  EVERY 12 HOURS  . tamsulosin (FLOMAX) 0.4 MG CAPS capsule Take 1 capsule (0.4 mg total) by mouth daily.  [name] acetaZOLAMIDE (DIAMOX) 250 MG tablet Take 1-2 tablets (250-500 mg total) by mouth 2 (two) times daily. (Patient not taking: Reported on 08/11/2018)  .  albuterol (PROVENTIL HFA;VENTOLIN HFA) 108 (90 Base) MCG/ACT inhaler Inhale 2 puffs into the lungs every 6 (six) hours as needed for wheezing or shortness of breath. (Patient not taking: Reported on 08/11/2018)  . ondansetron (ZOFRAN ODT) 4 MG disintegrating tablet Take 1 tablet (4 mg total) by mouth every 8 (eight) hours as needed for nausea or vomiting. (Patient not taking: Reported on 08/11/2018)   Facility-Administered Encounter Medications as of 08/11/2018  Medication    . ipratropium-albuterol (DUONEB) 0.5-2.5 (3) MG/3ML nebulizer solution 3 mL    Activities of Daily Living In your present state of health, do you have any difficulty performing the following activities: 08/11/2018  Hearing? Y  Vision? Y  Difficulty concentrating or making decisions? N  Walking or climbing stairs? N  Dressing or bathing? N  Doing errands, shopping? N  Preparing Food and eating ? N  Using the Toilet? N  In the past six months, have you accidently leaked urine? N  Do you have problems with loss of bowel control? N  Managing your Medications? N  Managing your Finances? N  Housekeeping or managing your Housekeeping? N  Some recent data might be hidden    Patient Care Team: Panosh, Standley Brooking, MD as PCP - General Juanita Craver, MD (Gastroenterology) Lelon Perla, MD (Cardiology) Jackolyn Confer, MD as Consulting Physician (General Surgery) Georgeann Oppenheim, MD as Consulting Physician (Ophthalmology) Chesley Mires, MD as Consulting Physician (Pulmonary Disease) Angelia Mould, MD as Consulting Physician (Vascular Surgery)   Assessment:   This is a routine wellness examination for Rustyn.  Exercise Activities and Dietary recommendations Current Exercise Habits: Home exercise routine, Type of exercise: walking, Intensity: Mild  Goals    . Weight (lb) < 200 lb (90.7 kg)     Fat free or low fat dairy products Fish high in omega-3 acids ( salmon, tuna, trout) Fruits, such as apples, bananas, oranges, pears, prunes Legumes, such as kidney beans, lentils, checkpeas, black-eyed peas and lima beans Vegetables; broccoli, cabbage, carrots Whole grains;   Plant fats are better; decrease "white" foods as pasta, rice, bread and desserts, sugar; Avoid red meat (limiting) palm and coconut oils; sugary foods and beverages  Two nutrients that raise blood chol levels are saturated fats and trans fat; in hydrogenated oils and fats, as stick margarine, baked goods (cookes,  cakes, pies, crackers; frosting; and coffee creamers;   Some Fats lower cholesterol: Monounsaturated and polyunsaturated  Avocados Corn, sunflower, and soybean oils Nuts and seeds, such as walnuts Olive, canola, peanut, safflower, and sesame oils Peanut butter Salmon and trout Tofu       . Weight (lb) < 275 lb (124.7 kg)     Continue to try to eat less; Watching the carbs; more vegetables and less carbs       Fall Risk Fall Risk  08/11/2018 06/03/2018 08/08/2017 06/27/2017 09/30/2016  Falls in the past year? No No No No Yes  Comment - - - - -  Number falls in past yr: - - - - 1  Comment - - - - fell on grass   Injury with Fall? - - - - No  Comment - - - - -  Follow up - - - - -     Depression Screen PHQ 2/9 Scores 08/11/2018 08/08/2017 06/27/2017 06/25/2016  PHQ - 2 Score 0 0 0 0    Cognitive Function MMSE - Mini Mental State Exam 08/11/2018  Not completed: (No Data)   Ad8 score reviewed for issues:  Issues  making decisions:  Less interest in hobbies / activities:  Repeats questions, stories (family complaining):  Trouble using ordinary gadgets (microwave, computer, phone):  Forgets the month or year:   Mismanaging finances:   Remembering appts:  Daily problems with thinking and/or memory: Ad8 score is=          Immunization History  Administered Date(s) Administered  . Influenza Split 10/09/2011, 10/05/2012  . Influenza Whole 10/24/2009, 10/25/2010  . Influenza, High Dose Seasonal PF 08/31/2015, 09/30/2016, 09/30/2017  . Influenza,inj,Quad PF,6+ Mos 10/26/2013, 11/08/2014  . Pneumococcal Conjugate-13 05/09/2015  . Pneumococcal Polysaccharide-23 04/24/2009, 06/25/2016  . Td 03/22/2009  . Tdap 04/06/2015  . Zoster 04/29/2012     Screening Tests Health Maintenance  Topic Date Due  . INFLUENZA VACCINE  07/30/2018  . OPHTHALMOLOGY EXAM  10/20/2018  . HEMOGLOBIN A1C  12/02/2018  . FOOT EXAM  06/26/2019  . URINE MICROALBUMIN  06/26/2019  . TETANUS/TDAP   04/05/2025  . COLONOSCOPY  01/03/2027  . Hepatitis C Screening  Completed  . PNA vac Low Risk Adult  Completed         Plan:      PCP Notes   Health Maintenance Colonoscopy 01/03/2017 and repeat 2028  Smoker with quit date 12/2008 Has had Abd CT 07/2016  Aorta normal caliber but states he has iliac aneurysm 130 64  Educated regarding shingrix   Abnormal Screens  Hearing is impaired but given resource for free hearing aid. 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 Medical and social history . Use of alcohol, tobacco or illicit drugs  . Current medications and supplements . Functional ability and status . Nutritional status . Physical activity . Advanced directives . List of other physicians . Hospitalizations, surgeries, and ER visits in previous 12 months . Vitals . Screenings to include cognitive, depression, and falls . Referrals and appointments  In addition, I have reviewed and discussed with patient certain preventive protocols, quality metrics, and best practice recommendations. A written personalized care plan for preventive services as well as general preventive health recommendations were provided to patient.     JKKXF,GHWEX, RN  08/11/2018  Above noted reviewed and agree. Shanon Ace, MD

## 2018-08-11 NOTE — Patient Instructions (Addendum)
[name] [name] , Thank you for taking time to come for your Medicare Wellness Visit. I appreciate your ongoing commitment to your health goals. Please review the following plan we discussed and let me know if I can assist you in the future.   Shingrix is a vaccine for the prevention of Shingles in Adults 50 and older.  If you are on Medicare, the shingrix is covered under your Part D plan, so you will take both of the vaccines in the series at your pharmacy. Please check with your benefits regarding applicable copays or out of pocket expenses.  The Shingrix is given in 2 vaccines approx 8 weeks apart. You must receive the 2nd dose prior to 6 months from receipt of the first. Please have the pharmacist print out you Immunization  dates for our office records   Deaf & Hard of Hearing Division Services - can assist with hearing aid x 1  No reviews  [facility]  [place] [id number]  [phone] - [place]  [email or web address]   These are the goals we discussed: Goals    . Weight (lb) < 200 lb (90.7 kg)     Fat free or low fat dairy products Fish high in omega-3 acids ( salmon, tuna, trout) Fruits, such as apples, bananas, oranges, pears, prunes Legumes, such as kidney beans, lentils, checkpeas, black-eyed peas and lima beans Vegetables; broccoli, cabbage, carrots Whole grains;   Plant fats are better; decrease "white" foods as pasta, rice, bread and desserts, sugar; Avoid red meat (limiting) palm and coconut oils; sugary foods and beverages  Two nutrients that raise blood chol levels are saturated fats and trans fat; in hydrogenated oils and fats, as stick margarine, baked goods (cookes, cakes, pies, crackers; frosting; and coffee creamers;   Some Fats lower cholesterol: Monounsaturated and polyunsaturated  Avocados Corn, sunflower, and soybean oils Nuts and seeds, such as walnuts  Olive, canola, peanut, safflower, and sesame oils Peanut butter Salmon and trout Tofu          This is a list of the screening recommended for you and due dates:  Health Maintenance  Topic Date Due  . Flu Shot  07/30/2018  . Eye exam for diabetics  10/20/2018  . Hemoglobin A1C  12/02/2018  . Complete foot exam   06/26/2019  . Urine Protein Check  06/26/2019  . Tetanus Vaccine  04/05/2025  . Colon Cancer Screening  01/03/2027  .  Hepatitis C: One time screening is recommended by Center for Disease Control  (CDC) for  adults born from [birth year] through 1967.   Completed  . Pneumonia vaccines  Completed      Fall Prevention in the Home Falls can cause injuries. They can happen to people of all ages. There are many things you can do to make your home safe and to help prevent falls. What can I do on the outside of my home?  Regularly fix the edges of walkways and driveways and fix any cracks.  Remove anything that might make you trip as you walk through a door, such as a raised step or threshold.  Trim any bushes or trees on the path to your home.  Use bright outdoor lighting.  Clear any walking paths of anything that might make someone trip, such as rocks or tools.  Regularly check to see if handrails are loose or broken. Make sure that both sides of any steps have handrails.  Any raised decks and porches should have guardrails  on the edges.  Have any leaves, snow, or ice cleared regularly.  Use sand or salt on walking paths during winter.  Clean up any spills in your garage right away. This includes oil or grease spills. What can I do in the bathroom?  Use night lights.  Install grab bars by the toilet and in the tub and shower. Do not use towel bars as grab bars.  Use non-skid mats or decals in the tub or shower.  If you need to sit down in the shower, use a plastic, non-slip stool.  Keep the floor dry. Clean up any water that spills on the floor as soon as it  happens.  Remove soap buildup in the tub or shower regularly.  Attach bath mats securely with double-sided non-slip rug tape.  Do not have throw rugs and other things on the floor that can make you trip. What can I do in the bedroom?  Use night lights.  Make sure that you have a light by your bed that is easy to reach.  Do not use any sheets or blankets that are too big for your bed. They should not hang down onto the floor.  Have a firm chair that has side arms. You can use this for support while you get dressed.  Do not have throw rugs and other things on the floor that can make you trip. What can I do in the kitchen?  Clean up any spills right away.  Avoid walking on wet floors.  Keep items that you use a lot in easy-to-reach places.  If you need to reach something above you, use a strong step stool that has a grab bar.  Keep electrical cords out of the way.  Do not use floor polish or wax that makes floors slippery. If you must use wax, use non-skid floor wax.  Do not have throw rugs and other things on the floor that can make you trip. What can I do with my stairs?  Do not leave any items on the stairs.  Make sure that there are handrails on both sides of the stairs and use them. Fix handrails that are broken or loose. Make sure that handrails are as long as the stairways.  Check any carpeting to make sure that it is firmly attached to the stairs. Fix any carpet that is loose or worn.  Avoid having throw rugs at the top or bottom of the stairs. If you do have throw rugs, attach them to the floor with carpet tape.  Make sure that you have a light switch at the top of the stairs and the bottom of the stairs. If you do not have them, ask someone to add them for you. What else can I do to help prevent falls?  Wear shoes that: ? Do not have high heels. ? Have rubber bottoms. ? Are comfortable and fit you well. ? Are closed at the toe. Do not wear sandals.  If you  use a stepladder: ? Make sure that it is fully opened. Do not climb a closed stepladder. ? Make sure that both sides of the stepladder are locked into place. ? Ask someone to hold it for you, if possible.  Clearly mark and make sure that you can see: ? Any grab bars or handrails. ? First and last steps. ? Where the edge of each step is.  Use tools that help you move around (mobility aids) if they are needed. These include: ? Canes. ?  Walkers. ? Scooters. ? Crutches.  Turn on the lights when you go into a dark area. Replace any light bulbs as soon as they burn out.  Set up your furniture so you have a clear path. Avoid moving your furniture around.  If any of your floors are uneven, fix them.  If there are any pets around you, be aware of where they are.  Review your medicines with your doctor. Some medicines can make you feel dizzy. This can increase your chance of falling. Ask your doctor what other things that you can do to help prevent falls. This information is not intended to replace advice given to you by your health care provider. Make sure you discuss any questions you have with your health care provider. Document Released: 10/12/2009 Document Revised: 05/23/2016 Document Reviewed: 01/20/2015 Elsevier Interactive Patient Education  2018 [employer] Maintenance, Male A healthy lifestyle and preventive care is important for your health and wellness. Ask your health care provider about what schedule of regular examinations is right for you. What should I know about weight and diet? Eat a Healthy Diet  Eat plenty of vegetables, fruits, whole grains, low-fat dairy products, and lean protein.  Do not eat a lot of foods high in solid fats, added sugars, or salt.  Maintain a Healthy Weight Regular exercise can help you achieve or maintain a healthy weight. You should:  Do at least 150 minutes of exercise each week. The exercise should increase your heart rate  and make you sweat (moderate-intensity exercise).  Do strength-training exercises at least twice a week.  Watch Your Levels of Cholesterol and Blood Lipids  Have your blood tested for lipids and cholesterol every 5 years starting at 69 years of age. If you are at high risk for heart disease, you should start having your blood tested when you are 69 years old. You may need to have your cholesterol levels checked more often if: ? Your lipid or cholesterol levels are high. ? You are older than 69 years of age. ? You are at high risk for heart disease.  What should I know about cancer screening? Many types of cancers can be detected early and may often be prevented. Lung Cancer  You should be screened every year for lung cancer if: ? You are a current smoker who has smoked for at least 30 years. ? You are a former smoker who has quit within the past 15 years.  Talk to your health care provider about your screening options, when you should start screening, and how often you should be screened.  Colorectal Cancer  Routine colorectal cancer screening usually begins at 69 years of age and should be repeated every 5-10 years until you are 69 years old. You may need to be screened more often if early forms of precancerous polyps or small growths are found. Your health care provider may recommend screening at an earlier age if you have risk factors for colon cancer.  Your health care provider may recommend using home test kits to check for hidden blood in the stool.  A small camera at the end of a tube can be used to examine your colon (sigmoidoscopy or colonoscopy). This checks for the earliest forms of colorectal cancer.  Prostate and Testicular Cancer  Depending on your age and overall health, your health care provider may do certain tests to screen for prostate and testicular cancer.  Talk to your health care provider about any symptoms or concerns  you have about testicular or prostate  cancer.  Skin Cancer  Check your skin from head to toe regularly.  Tell your health care provider about any new moles or changes in moles, especially if: ? There is a change in a mole's size, shape, or color. ? You have a mole that is larger than a pencil eraser.  Always use sunscreen. Apply sunscreen liberally and repeat throughout the day.  Protect yourself by wearing long sleeves, pants, a wide-brimmed hat, and sunglasses when outside.  What should I know about heart disease, diabetes, and high blood pressure?  If you are 21-50 years of age, have your blood pressure checked every 3-5 years. If you are 73 years of age or older, have your blood pressure checked every year. You should have your blood pressure measured twice-once when you are at a hospital or clinic, and once when you are not at a hospital or clinic. Record the average of the two measurements. To check your blood pressure when you are not at a hospital or clinic, you can use: ? An automated blood pressure machine at a pharmacy. ? A home blood pressure monitor.  Talk to your health care provider about your target blood pressure.  If you are between 84-7 years old, ask your health care provider if you should take aspirin to prevent heart disease.  Have regular diabetes screenings by checking your fasting blood sugar level. ? If you are at a normal weight and have a low risk for diabetes, have this test once every three years after the age of 59. ? If you are overweight and have a high risk for diabetes, consider being tested at a younger age or more often.  A one-time screening for abdominal aortic aneurysm (AAA) by ultrasound is recommended for men aged 49-75 years who are current or former smokers. What should I know about preventing infection? Hepatitis B If you have a higher risk for hepatitis B, you should be screened for this virus. Talk with your health care provider to find out if you are at risk for hepatitis B  infection. Hepatitis C Blood testing is recommended for:  Everyone born from [birth year] through 1967.  Anyone with known risk factors for hepatitis C.  Sexually Transmitted Diseases (STDs)  You should be screened each year for STDs including gonorrhea and chlamydia if: ? You are sexually active and are younger than 69 years of age. ? You are older than 69 years of age and your health care provider tells you that you are at risk for this type of infection. ? Your sexual activity has changed since you were last screened and you are at an increased risk for chlamydia or gonorrhea. Ask your health care provider if you are at risk.  Talk with your health care provider about whether you are at high risk of being infected with HIV. Your health care provider may recommend a prescription medicine to help prevent HIV infection.  What else can I do?  Schedule regular health, dental, and eye exams.  Stay current with your vaccines (immunizations).  Do not use any tobacco products, such as cigarettes, chewing tobacco, and e-cigarettes. If you need help quitting, ask your health care provider.  Limit alcohol intake to no more than 2 drinks per day. One drink equals 12 ounces of beer, 5 ounces of wine, or 1 ounces of hard liquor.  Do not use street drugs.  Do not share needles.  Ask your health care provider for help  if you need support or information about quitting drugs.  Tell your health care provider if you often feel depressed.  Tell your health care provider if you have ever been abused or do not feel safe at home. This information is not intended to replace advice given to you by your health care provider. Make sure you discuss any questions you have with your health care provider. Document Released: 06/13/2008 Document Revised: 08/14/2016 Document Reviewed: 09/19/2015 Elsevier Interactive Patient Education  2018 [employer].   Hearing Loss Hearing loss is a partial or total loss of  the ability to hear. This can be temporary or permanent, and it can happen in one or both ears. Hearing loss may be referred to as deafness. Medical care is necessary to treat hearing loss properly and to prevent the condition from getting worse. Your hearing may partially or completely come back, depending on what caused your hearing loss and how severe it is. In some cases, hearing loss is permanent. What are the causes? Common causes of hearing loss include:  Too much wax in the ear canal.  Infection of the ear canal or middle ear.  Fluid in the middle ear.  Injury to the ear or surrounding area.  An object stuck in the ear.  Prolonged exposure to loud sounds, such as music.  Less common causes of hearing loss include:  Tumors in the ear.  Viral or bacterial infections, such as meningitis.  A hole in the eardrum (perforated eardrum).  Problems with the hearing nerve that sends signals between the brain and the ear.  Certain medicines.  What are the signs or symptoms? Symptoms of this condition may include:  Difficulty telling the difference between sounds.  Difficulty following a conversation when there is background noise.  Lack of response to sounds in your environment. This may be most noticeable when you do not respond to startling sounds.  Needing to turn up the volume on the television, radio, etc.  Ringing in the ears.  Dizziness.  Pain in the ears.  How is this diagnosed? This condition is diagnosed based on a physical exam and a hearing test (audiometry). The audiometry test will be performed by a hearing specialist (audiologist). You may also be referred to an ear, nose, and throat (ENT) specialist (otolaryngologist). How is this treated? Treatment for recent onset of hearing loss may include:  Ear wax removal.  Being prescribed medicines to prevent infection (antibiotics).  Being prescribed medicines to reduce inflammation  (corticosteroids).  Follow these instructions at home:  If you were prescribed an antibiotic medicine, take it as told by your health care provider. Do not stop taking the antibiotic even if you start to feel better.  Take over-the-counter and prescription medicines only as told by your health care provider.  Avoid loud noises.  Return to your normal activities as told by your health care provider. Ask your health care provider what activities are safe for you.  Keep all follow-up visits as told by your health care provider. This is important. Contact a health care provider if:  You feel dizzy.  You develop new symptoms.  You vomit or feel nauseous.  You have a fever. Get help right away if:  You develop sudden changes in your vision.  You have severe ear pain.  You have new or increased weakness.  You have a severe headache. This information is not intended to replace advice given to you by your health care provider. Make sure you discuss any  questions you have with your health care provider. Document Released: 12/16/2005 Document Revised: 05/23/2016 Document Reviewed: 05/03/2015 Elsevier Interactive Patient Education  2018 [employer].

## 2018-08-21 ENCOUNTER — Other Ambulatory Visit: Payer: Self-pay | Admitting: Internal Medicine

## 2018-08-28 ENCOUNTER — Encounter (HOSPITAL_COMMUNITY): Payer: Medicare Other

## 2018-08-28 ENCOUNTER — Ambulatory Visit: Payer: Medicare Other | Admitting: Family

## 2018-09-02 ENCOUNTER — Encounter: Payer: Self-pay | Admitting: Internal Medicine

## 2018-09-02 ENCOUNTER — Ambulatory Visit (INDEPENDENT_AMBULATORY_CARE_PROVIDER_SITE_OTHER): Payer: Medicare Other | Admitting: Internal Medicine

## 2018-09-02 ENCOUNTER — Ambulatory Visit: Payer: Self-pay | Admitting: *Deleted

## 2018-09-02 VITALS — BP 122/54 | HR 58 | Temp 98.1°F | Wt 282.6 lb

## 2018-09-02 DIAGNOSIS — Z7901 Long term (current) use of anticoagulants: Secondary | ICD-10-CM

## 2018-09-02 DIAGNOSIS — H47011 Ischemic optic neuropathy, right eye: Secondary | ICD-10-CM

## 2018-09-02 DIAGNOSIS — R42 Dizziness and giddiness: Secondary | ICD-10-CM

## 2018-09-02 DIAGNOSIS — E1159 Type 2 diabetes mellitus with other circulatory complications: Secondary | ICD-10-CM

## 2018-09-02 DIAGNOSIS — R110 Nausea: Secondary | ICD-10-CM | POA: Diagnosis not present

## 2018-09-02 DIAGNOSIS — H9190 Unspecified hearing loss, unspecified ear: Secondary | ICD-10-CM

## 2018-09-02 MED ORDER — ONDANSETRON 4 MG PO TBDP: 4.0000 mg | ORAL_TABLET | Freq: Three times a day (TID) | ORAL | 0 refills | Status: DC | PRN

## 2018-09-02 NOTE — Patient Instructions (Addendum)
Take the famotidine twice  A day .( max 20 mg  Twice a day)  And zofran  As needed.   This could be a viral vs    Gall bladder  Vs     Acid  Issues .   Since worse after eating   Your exam is reassuring .   Stay hydrated  And monitor   Pulse etc.   I f  not subsiding  Then contact us .

## 2018-09-02 NOTE — Progress Notes (Signed)
Chief Complaint  Patient presents with  . Dizziness    Dizziness and Nausea x 1 week. Pt has been having a headache, right temple x 3 months, off and on. Pt reports having partial vision loss in right eye, diagnosed in June 2019.  Pt notes having some central abdominal discomfort along with the nausea a week ago and then dizziness set in.  Decreased appetite. Taking Zofran for Nausea. Dizziness seems to set in after/around eating.    HPI: [name] [name]. 67 y.o. come in for sda for "dizziness"    With nauseas Sent in by triage     Nausea and dizziness.  For 2+ days   No fever cough sob   Seems to happen after eating  But no vomiting other neuro sx   When gets  gerd uses pepcid and adds protonix  And usually helps.so adding this prn recently with some help but otherwise Dec appetite  Upper abd discomfort but not serious abd pain .   Sugar are in range 100 - 120  loso stoll from magnesium  No change in habits   Ion  Of right eye    Has medial inferior   Vision change  llq area .  Off of the diuretic   But still onpressure drops.  Had renal stone last month has gall stone on ct  Taking care of 69 yo grandson    has Hyperlipidemia; OBESITY, UNSPECIFIED; ERECTILE DYSFUNCTION; Obstructive sleep apnea; ULNAR NEUROPATHY, LEFT; MYOCARDIAL INFARCTION, HX OF; Coronary atherosclerosis; Atrial fibrillation with RVR ([place]); GERD; Lateral epicondylitis; PALPITATIONS; LIVER FUNCTION TESTS, ABNORMAL; TOBACCO USE, QUIT; Personal history of colonic polyps; Visit for preventive health examination; Retinal tear; High risk medication use; Anticoagulation management encounter; Decreased hearing; Hemorrhoids; Sleep disorder; Chronic anticoagulation; Morbid obesity ([place]); Type 2 diabetes mellitus with other circulatory complications ([place]); Iliac aneurysm ([place]); Atrial fibrillation ([place]); Thrombocytopenia ([place]); Acute back pain with sciatica, right; Right hip pain; and Drusen of left optic disc on their problem  list.  ROS: See pertinent positives and negatives per HPI. No bleeding pulse is usually 55-60 no changes   Past Medical History:  Diagnosis Date  . Atrial fibrillation ([place]) 03/22/2009   a. s/p multiple DCCV; b. no coumadin due to low TE risk profile; c. Tikosyn Rx  . Coronary atherosclerosis of native coronary artery 11/2002   a. s/p stent to LAD 12/03; OM2 occluded at cath 12/03; d. myoview 5/10: no ischemia;  e. echo 7/11: EF 55%, BAE, mild RVE, PASP 41-45; Myoview was in March 2013. There was no ischemia or infarction, EF 51%   . Cutaneous abscess of back excluding buttocks 07/04/2014   Appears to stem from possibly a cyst very large area 6 cm contact surgeon office   . Diabetes mellitus without complication ([place])   . Drusen body    see opth note  . ERECTILE DYSFUNCTION 03/22/2009  . GERD 03/22/2009  . HYPERGLYCEMIA 04/25/2010  . HYPERLIPIDEMIA 03/22/2009  . 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 LATERAL EPICONDYLITIS, LEFT 10/24/2009  . LIVER FUNCTION TESTS, ABNORMAL 04/25/2010  . Local reaction to immunization 05/05/2012   minor resolving  zostavax   . Myocardial infarction ([place]) mi2003  . Numbness in left leg    foot related to back disease and surgery  . Obesity, unspecified 04/24/2009  . Perforated appendicitis with necrosis s/p open appendectomy 06/07/14 06/04/2014  . Renal cyst    Characterized by MRI as simple  .  Ruptured suppurative appendicitis    2015   . SLEEP APNEA, OBSTRUCTIVE 03/22/2009   compliant with CPAP  . THROMBOCYTOPENIA 08/16/2010  . TOBACCO USE, QUIT 10/24/2009  . 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 Umbilical hernia     Family History  Problem Relation Age of Onset  . Thyroid disease Mother   . Ovarian cancer Mother   . Breast cancer Mother   . Lung cancer Father   . Cancer Father     Social History   Socioeconomic History  . Marital status: Married    Spouse name: Not on file  . Number of children: Not on file  .  Years of education: Not on file  . Highest education level: Not on file  Occupational History  . Not on file  Social Needs  . Financial resource strain: Not on file  . Food insecurity:    Worry: Not on file    Inability: Not on file  . Transportation needs:    Medical: Not on file    Non-medical: Not on file  Tobacco Use  . Smoking status: Former Smoker    Packs/day: 2.00    Years: 42.00    Pack years: 84.00    Types: Cigarettes    Last attempt to quit: 12/30/2008    Years since quitting: 9.6  . Smokeless tobacco: Never Used  . Tobacco comment: started at age 60; 1-2 ppd; quit 2010  Substance and Sexual Activity  . Alcohol use: No    Alcohol/week: 0.0 standard drinks    Comment: rarely; maybe 1 beer a year  . Drug use: No  . Sexual activity: Yes  Lifestyle  . Physical activity:    Days per week: Not on file    Minutes per session: Not on file  . Stress: Not on file  Relationships  . Social connections:    Talks on phone: Not on file    Gets together: Not on file    Attends religious service: Not on file    Active member of club or organization: Not on file    Attends meetings of clubs or organizations: Not on file    Relationship status: Not on file  Other Topics Concern  . Not on file  Social History Narrative   Retired paramedic   Regular exercise-yes not recently    Has children   Wife is overweight and has fibromyalgia and depression on disability doesn't go out much. Back surgery    Has older dog   Retired from [employer] 31 years of service now 7 years    Caffeine- minimal    Outpatient Medications Prior to Visit  Medication Sig Dispense Refill  . acetaminophen (TYLENOL) 325 MG tablet Take 650 mg by mouth every 6 (six) hours as needed for mild pain.    [name] albuterol (PROVENTIL HFA;VENTOLIN HFA) 108 (90 Base) MCG/ACT inhaler Inhale 2 puffs into the lungs every 6 (six) hours as needed for wheezing or shortness of breath. 1 Inhaler 0  . ALPHAGAN P 0.1 % SOLN INSTILL  1 DROP INTO RIGHT EYE 3 TIMES A DAY  3  . atorvastatin (LIPITOR) 80 MG tablet TAKE 1 TABLET BY MOUTH  DAILY 90 tablet 1  . BELSOMRA 20 MG TABS TAKE 1 TABLET BY MOUTH AT  BEDTIME 90 tablet 0  . diphenhydrAMINE (BENADRYL) 25 MG tablet Take 25 mg by mouth at bedtime as needed for sleep.    [name] dofetilide (TIKOSYN) 500 MCG capsule TAKE 1 CAPSULE BY MOUTH TWO  TIMES DAILY 180 capsule 3  . famotidine (PEPCID) 10 MG tablet Take 10 mg by mouth as needed for heartburn.     . fish oil-omega-3 fatty acids 1000 MG capsule Take 1 g by mouth 2 (two) times daily.     . fluticasone (FLONASE) 50 MCG/ACT nasal spray Place 2 sprays into both nostrils daily. (Patient taking differently: Place 2 sprays into both nostrils daily as needed for allergies. ) 16 g 6  . KLOR-CON M20 20 MEQ tablet TAKE 1 TABLET BY MOUTH EVERY DAY 90 tablet 3  . Magnesium Oxide 500 MG TABS Take 750 mg by mouth daily.    . metFORMIN (GLUCOPHAGE-XR) 500 MG 24 hr tablet TAKE 3 TABLETS BY MOUTH  DAILY WITH BREAKFAST (Patient taking differently: TAKE 1 TABLETS BY MOUTH  THREE TIMES DAILY.) 270 tablet 0  . MULTIPLE VITAMIN PO Take 1 tablet by mouth every evening.     . nitroGLYCERIN (NITROSTAT) 0.4 MG SL tablet Place 1 tablet (0.4 mg total) under the tongue every 5 (five) minutes as needed for chest pain. 25 tablet 12  . ONETOUCH VERIO test strip USE TWICE A DAY 50 each 12  . PRADAXA 150 MG CAPS capsule TAKE 1 CAPSULE BY MOUTH  EVERY 12 HOURS 180 capsule 1  . tamsulosin (FLOMAX) 0.4 MG CAPS capsule TAKE 1 CAPSULE BY MOUTH EVERY DAY 30 capsule 5  . ondansetron (ZOFRAN ODT) 4 MG disintegrating tablet Take 1 tablet (4 mg total) by mouth every 8 (eight) hours as needed for nausea or vomiting. 20 tablet 0  . acetaZOLAMIDE (DIAMOX) 250 MG tablet Take 1-2 tablets (250-500 mg total) by mouth 2 (two) times daily. (Patient not taking: Reported on 08/11/2018) 60 tablet 6   Facility-Administered Medications Prior to Visit  Medication Dose Route Frequency Provider  Last Rate Last Dose  . ipratropium-albuterol (DUONEB) 0.5-2.5 (3) MG/3ML nebulizer solution 3 mL  3 mL Nebulization Q6H Jadaya Sommerfield, Standley Brooking, MD   3 mL at 01/02/18 1545     EXAM:  BP (!) 122/54 (BP Location: Right Arm, Patient Position: Sitting, Cuff Size: Normal)   Pulse (!) 58   Temp 98.1 F (36.7 C) (Oral)   Wt 282 lb 9.6 oz (128.2 kg)   SpO2 93%   BMI 37.28 kg/m   Body mass index is 37.28 kg/m. Mild had of hearing  GENERAL: vitals reviewed and listed above, alert, oriented, appears well hydrated and in no acute distress HEENT: atraumatic, conjunctiva  clear, no obvious abnormalities on inspection of external nose and ears OP : no lesion edema or exudate  NECK: no obvious masses on inspection palpation  LUNGS: clear to auscultation bilaterally, no wheezes, rales or rhonchi, good air movement CV: HRRR, no clubbing cyanosis or  peripheral edema nl cap refill  Abdomen:  Sof,t normal bowel sounds without hepatosplenomegaly, no guarding rebound or masses no CVA tenderness  Mid and ruq could be tender  MS: moves all extremities without noticeable focal  abnormality PSYCH: pleasant and cooperative, no obvious depression or anxiety Neuro grossly non focal vision not tested  Lab Results  Component Value Date   WBC 9.6 07/30/2018   HGB 15.5 07/30/2018   HCT 47.9 07/30/2018   PLT 144 (L) 07/30/2018   GLUCOSE 152 (H) 07/30/2018   CHOL 91 06/25/2018   TRIG 158.0 (H) 06/25/2018   HDL 30.60 (L) 06/25/2018   LDLCALC 29 06/25/2018   ALT 65 (H) 07/30/2018   AST 32 07/30/2018   NA 140 07/30/2018   K  4.1 07/30/2018   CL 111 07/30/2018   CREATININE 1.20 07/30/2018   BUN 23 07/30/2018   CO2 21 (L) 07/30/2018   TSH 1.91 06/25/2018   PSA 0.93 06/25/2018   INR 1.31 12/16/2016   HGBA1C 7.0 (H) 06/02/2018   MICROALBUR 9.3 (H) 06/25/2018   BP Readings from Last 3 Encounters:  09/02/18 (!) 122/54  08/11/18 130/80  07/30/18 126/72   Wt Readings from Last 3 Encounters:  09/02/18 282 lb 9.6  oz (128.2 kg)  08/11/18 290 lb (131.5 kg)  07/27/18 288 lb 9.6 oz (130.9 kg)     ASSESSMENT AND PLAN:  Discussed the following assessment and plan:  Dizziness and nausea - post prandial see text.  Nausea  Morbid obesity ([place]) - Continuing to lose weight in a healthy manner.  Type 2 diabetes mellitus with other circulatory complications ([facility])  Chronic anticoagulation  Decreased hearing, unspecified laterality  Ischemic optic neuropathy of right eye Exam is reassuring no obvious cause could be viral and self-limiting or related to GI with postprandial symptoms. He does have a gallstone but not classic for biliary disease. I doubt CNS disease he has had an MRI done in the last couple months also.  No evidence of new neurologic findings. Symptomatic treatment at this time monitor follow-up if persistent progressive or new findings.  -Patient advised to return or notify health care team  if  new concerns arise.  Patient Instructions  Take the famotidine twice  A day .( max 20 mg  Twice a day)  And zofran  As needed.   This could be a viral vs    Gall bladder  Vs     Acid  Issues .   Since worse after eating   Your exam is reassuring .   Stay hydrated  And monitor   Pulse etc.   I f  not subsiding  Then contact us .        Standley Brooking. Jayquan Bradsher M.D.

## 2018-09-02 NOTE — Telephone Encounter (Signed)
Pt states he has been experiencing headaches, dizziness, nausea with no appetite since Monday. Pt states that the dizziness seems to get worse with eating. Pt states he is not dizzy at this time and is currently lying in a supine position. Pt states that the headache is in the right temporal area. Pt states that even thought he has nausea he has not vomited and has been taking Zofran. Pt states he has not been able to eat breakfast or lunch on today.Pt denies any fever or chest pain at this time. On Monday pt states he started to feel like he was having GI problems, with having discomfort below the ribs and did take Pepcid which did not really help. Pt states he has a history of GERD in the past and was on Protonix for years and was able to wean hisself off of the medication without experiencing any difficulties. Pt states that on Monday it felt like he was having GERD attack. Pt states he his glucose has been ranging between 100-120 and is currently taking Metformin. Pt scheduled for appt today with Dr. Regis Bill and advised if symptoms become worse before scheduled appt to return call to the office. Pt verbalized understanding.  Reason for Disposition . [1] MILD dizziness (e.g., walking normally) AND [2] has NOT been evaluated by physician for this  (Exception: dizziness caused by heat exposure, sudden standing, or poor fluid intake)  Answer Assessment - Initial Assessment Questions 1. DESCRIPTION: "Describe your dizziness."     Feels dizzy and woozy after eating meals 2. LIGHTHEADED: "Do you feel lightheaded?" (e.g., somewhat faint, woozy, weak upon standing)     Feels woozy 3. VERTIGO: "Do you feel like either you or the room is spinning or tilting?" (i.e. vertigo)     No 4. SEVERITY: "How bad is it?"  "Do you feel like you are going to faint?" "Can you stand and walk?"   - MILD - walking normally   - MODERATE - interferes with normal activities (e.g., work, school)    - SEVERE - unable to stand,  requires support to walk, feels like passing out now. mild      5. ONSET:  "When did the dizziness begin?"     Monday 6. AGGRAVATING FACTORS: "Does anything make it worse?" (e.g., standing, change in head position)     No 7. 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 CAUSE: "What do you think is causing the dizziness?"     unsure 9. RECURRENT SYMPTOM: "Have you had dizziness before?" If so, ask: "When was the last time?" "What happened that time?"     No 10. OTHER SYMPTOMS: "Do you have any other symptoms?" (e.g., fever, chest pain, vomiting, diarrhea, bleeding)       No, Monday almost like constipation but it is usually regular 11. PREGNANCY: "Is there any chance you are pregnant?" "When was your last menstrual period?"       n/a  Protocols used: DIZZINESS [facility]

## 2018-09-19 ENCOUNTER — Other Ambulatory Visit: Payer: Self-pay | Admitting: Internal Medicine

## 2018-09-21 ENCOUNTER — Other Ambulatory Visit: Payer: Self-pay | Admitting: Internal Medicine

## 2018-09-23 NOTE — Telephone Encounter (Signed)
LM with patient wife to have patient call back Need to verify instructions to Metformin...  3 tablets every morning OR 1 tablet TID?

## 2018-09-23 NOTE — Telephone Encounter (Signed)
Pt called back and stated that he currently takes 1 tablet in the morning, 1 in afternoon and 1 at night.

## 2018-09-25 ENCOUNTER — Encounter: Payer: Self-pay | Admitting: Family

## 2018-09-25 ENCOUNTER — Ambulatory Visit (INDEPENDENT_AMBULATORY_CARE_PROVIDER_SITE_OTHER): Payer: Medicare Other | Admitting: Family

## 2018-09-25 ENCOUNTER — Other Ambulatory Visit: Payer: Self-pay

## 2018-09-25 ENCOUNTER — Ambulatory Visit (HOSPITAL_COMMUNITY)
Admission: RE | Admit: 2018-09-25 | Discharge: 2018-09-25 | Disposition: A | Discharge status: Home / Self Care | Payer: Medicare Other | Source: Ambulatory Visit | Attending: Family | Admitting: Family

## 2018-09-25 VITALS — BP 118/64 | HR 54 | Temp 97.6°F | Resp 18 | Ht 73.0 in | Wt 285.0 lb

## 2018-09-25 DIAGNOSIS — I7789 Other specified disorders of arteries and arterioles: Secondary | ICD-10-CM

## 2018-09-25 DIAGNOSIS — Z87891 Personal history of nicotine dependence: Secondary | ICD-10-CM | POA: Diagnosis not present

## 2018-09-25 DIAGNOSIS — I723 Aneurysm of iliac artery: Secondary | ICD-10-CM | POA: Diagnosis not present

## 2018-09-25 DIAGNOSIS — I779 Disorder of arteries and arterioles, unspecified: Secondary | ICD-10-CM | POA: Diagnosis not present

## 2018-09-25 NOTE — Patient Instructions (Addendum)
Before your next abdominal ultrasound:  Avoid gas forming foods and beverages the day before the test.   Take two Extra-Strength Gas-X capsules at bedtime the night before the test. Take another two Extra-Strength Gas-X capsules in the middle of the night if you get up to the restroom, if not, first thing in the morning with water.  Do not chew gum.      Abdominal Aortic Aneurysm Blood pumps away from the heart through tubes (blood vessels) called arteries. Aneurysms are weak or damaged places in the wall of an artery. It bulges out like a balloon. An abdominal aortic aneurysm happens in the main artery of the body (aorta). It can burst or tear, causing bleeding inside the body. This is an emergency. It needs treatment right away. What are the causes? The exact cause is unknown. Things that could cause this problem include:  Fat and other substances building up in the lining of a tube.  Swelling of the walls of a blood vessel.  Certain tissue diseases.  Belly (abdominal) trauma.  An infection in the main artery of the body.  What increases the risk? There are things that make it more likely for you to have an aneurysm. These include:  Being over the age of 69 years old.  Having high blood pressure (hypertension).  Being a male.  Being white.  Being very overweight (obese).  Having a family history of aneurysm.  Using tobacco products.  What are the signs or symptoms? Symptoms depend on the size of the aneurysm and how fast it grows. There may not be symptoms. If symptoms occur, they can include:  Pain (belly, side, lower back, or groin).  Feeling full after eating a small amount of food.  Feeling sick to your stomach (nauseous), throwing up (vomiting), or both.  Feeling a lump in your belly that feels like it is beating (pulsating).  Feeling like you will pass out (faint).  How is this treated?  Medicine to control blood pressure and pain.  Imaging tests to  see if the aneurysm gets bigger.  Surgery. How is this prevented? To lessen your chance of getting this condition:  Stop smoking. Stop chewing tobacco.  Limit or avoid alcohol.  Keep your blood pressure, blood sugar, and cholesterol within normal limits.  Eat less salt.  Eat foods low in saturated fats and cholesterol. These are found in animal and whole dairy products.  Eat more fiber. Fiber is found in whole grains, vegetables, and fruits.  Keep a healthy weight.  Stay active and exercise often.  This information is not intended to replace advice given to you by your health care provider. Make sure you discuss any questions you have with your health care provider. Document Released: 04/12/2013 Document Revised: 05/23/2016 Document Reviewed: 01/15/2013 Elsevier Interactive Patient Education  2017 [employer]  

## 2018-09-25 NOTE — Progress Notes (Signed)
[facility] [place]   CC: Follow up Right Common Iliac Artery Ectasia  History of Present Illness  [name] [name]. is a 67 y.o. (Apr 23, 1951) male who had appendicitis about 2015with rupture and underwent surgery for this. He did have an open wound that required wet to dry dressings at that time. This healed. 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 He has not had any abdominal pain or back pain. He does chronic back pain as he has had multiple back surgeries in the past. There is no family hx of aneurysms.  Pt was referred to Dr. Scot Dock by Dr. Shanon Ace, initial evaluation by Dr. Scot Dock on 08-09-15.  He denies any claudication type symptoms, but states he does have some numbness on the ball of his left foot and to his 2nd toe and states this is from his back issues. He has low back, right hip, right groin, radiating to right knee pain for about a month, tramadol and a muscle relaxer helps. Recent MRI of both found acetabular tear. He is taking prednisone since early August 2018, which has also helped this pain.    He is a retired Audiological scientist.    He also has a history of PAF with palpitations.He has PAF since 2005 and has undergone cardioversion x 9. He is on Tikosyn and Pradaxa for this. He states if he can get his weight below 300 pounds, Dr. Joylene Grapes will consider a cardiac ablation.He is now less than 300 pounds.   He does have hx of renal cyst on the right kidney that was followed with ultrasound.   He is on a statin for cholesterol management. He does not take any medication for hypertension as he has normal blood pressure.   The patient denies anyhistory of stroke or TIA symptoms.  He had bronchitis in February and March 2018.   He was seen in th ED in August 1610 for renal colic, strained his urine and found stones.  He experienced blurriness in his right eye in June 2019, had a thorough  opthalmology and neurology work up, carotid duplex showed 1-39% bilateral extracranial carotid artery stenosis (review of records, 06-05-18 carotid duplex). Right eye blurriness remains.    Pt Diabetic: Yes, last A1C result on file was 7.0 on 06-02-18 Pt smoker: former smoker, quit in 2010, smoked 40 years   Past Medical History:  Diagnosis Date  . Atrial fibrillation ([place]) 03/22/2009   a. s/p multiple DCCV; b. no coumadin due to low TE risk profile; c. Tikosyn Rx  . Coronary atherosclerosis of native coronary artery 11/2002   a. s/p stent to LAD 12/03; OM2 occluded at cath 12/03; d. myoview 5/10: no ischemia;  e. echo 7/11: EF 55%, BAE, mild RVE, PASP 41-45; Myoview was in March 2013. There was no ischemia or infarction, EF 51%   . Cutaneous abscess of back excluding buttocks 07/04/2014   Appears to stem from possibly a cyst very large area 6 cm contact surgeon office   . Diabetes mellitus without complication ([place])   . Drusen body    see opth note  . ERECTILE DYSFUNCTION 03/22/2009  . GERD 03/22/2009  . HYPERGLYCEMIA 04/25/2010  . HYPERLIPIDEMIA 03/22/2009  . 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 LATERAL EPICONDYLITIS, LEFT 10/24/2009  . LIVER FUNCTION TESTS, ABNORMAL 04/25/2010  . Local reaction to immunization 05/05/2012   minor resolving  zostavax   .  Myocardial infarction ([place]) mi2003  . Numbness in left leg    foot related to back disease and surgery  . Obesity, unspecified 04/24/2009  . Perforated appendicitis with necrosis s/p open appendectomy 06/07/14 06/04/2014  . Renal cyst    Characterized by MRI as simple  . Ruptured suppurative appendicitis    2015   . SLEEP APNEA, OBSTRUCTIVE 03/22/2009   compliant with CPAP  . THROMBOCYTOPENIA 08/16/2010  . TOBACCO USE, QUIT 10/24/2009  . 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 Umbilical hernia    Past Surgical History:  Procedure Laterality Date  . APPENDECTOMY N/A 06/06/2014   Procedure: APPENDECTOMY;   Surgeon: Odis Hollingshead, MD;  Location: [facility];  Service: General;  Laterality: N/A;  . COLON SURGERY    . COLONOSCOPY  11/15/2011   Procedure: COLONOSCOPY;  Surgeon: Juanita Craver, MD;  Location: [facility];  Service: Endoscopy;  Laterality: N/A;  . COLONOSCOPY WITH PROPOFOL N/A 01/03/2017   Procedure: COLONOSCOPY WITH PROPOFOL;  Surgeon: Carol Ada, MD;  Location: [facility];  Service: Endoscopy;  Laterality: N/A;  . cyst on epiglottis  08/2002  . ESOPHAGOGASTRODUODENOSCOPY  11/15/2011   Procedure: ESOPHAGOGASTRODUODENOSCOPY (EGD);  Surgeon: Juanita Craver, MD;  Location: [facility];  Service: Endoscopy;  Laterality: N/A;  . LAPAROSCOPIC APPENDECTOMY N/A 06/06/2014   Procedure: APPENDECTOMY LAPAROSCOPIC attemted;  Surgeon: Odis Hollingshead, MD;  Location: [facility];  Service: General;  Laterality: N/A;  . LUMBAR DISC SURGERY     two  holes in spinalcovering  . stent     LAD DUS 2004  . surgery l4-l5   1998   ruptured x 3  . ulnar neuropathy    . UMBILICAL HERNIA REPAIR     mesh   Social History Social History   Socioeconomic History  . Marital status: Married    Spouse name: Not on file  . Number of children: Not on file  . Years of education: Not on file  . Highest education level: Not on file  Occupational History  . Not on file  Social Needs  . Financial resource strain: Not on file  . Food insecurity:    Worry: Not on file    Inability: Not on file  . Transportation needs:    Medical: Not on file    Non-medical: Not on file  Tobacco Use  . Smoking status: Former Smoker    Packs/day: 2.00    Years: 42.00    Pack years: 84.00    Types: Cigarettes    Last attempt to quit: 12/30/2008    Years since quitting: 9.7  . Smokeless tobacco: Never Used  . Tobacco comment: started at age 24; 1-2 ppd; quit 2010  Substance and Sexual Activity  . Alcohol use: No    Alcohol/week: 0.0 standard drinks    Comment: rarely; maybe 1 beer a year  . Drug use: No  . Sexual activity:  Yes  Lifestyle  . Physical activity:    Days per week: Not on file    Minutes per session: Not on file  . Stress: Not on file  Relationships  . Social connections:    Talks on phone: Not on file    Gets together: Not on file    Attends religious service: Not on file    Active member of club or organization: Not on file    Attends meetings of clubs or organizations: Not on file    Relationship status: Not on file  . Intimate partner violence:  Fear of current or ex partner: Not on file    Emotionally abused: Not on file    Physically abused: Not on file    Forced sexual activity: Not on file  Other Topics Concern  . Not on file  Social History Narrative   Retired paramedic   Regular exercise-yes not recently    Has children   Wife is overweight and has fibromyalgia and depression on disability doesn't go out much. Back surgery    Has older dog   Retired from [employer] 31 years of service now 7 years    Caffeine- minimal   Family History Family History  Problem Relation Age of Onset  . Thyroid disease Mother   . Ovarian cancer Mother   . Breast cancer Mother   . Lung cancer Father   . Cancer Father     Current Outpatient Medications on File Prior to Visit  Medication Sig Dispense Refill  . acetaminophen (TYLENOL) 325 MG tablet Take 650 mg by mouth every 6 (six) hours as needed for mild pain.    [name] albuterol (PROVENTIL HFA;VENTOLIN HFA) 108 (90 Base) MCG/ACT inhaler Inhale 2 puffs into the lungs every 6 (six) hours as needed for wheezing or shortness of breath. 1 Inhaler 0  . ALPHAGAN P 0.1 % SOLN INSTILL 1 DROP INTO RIGHT EYE 3 TIMES A DAY  3  . atorvastatin (LIPITOR) 80 MG tablet TAKE 1 TABLET BY MOUTH  DAILY 90 tablet 1  . BELSOMRA 20 MG TABS TAKE 1 TABLET BY MOUTH AT  BEDTIME 90 tablet 0  . diphenhydrAMINE (BENADRYL) 25 MG tablet Take 25 mg by mouth at bedtime as needed for sleep.    [name] dofetilide (TIKOSYN) 500 MCG capsule TAKE 1 CAPSULE BY MOUTH TWO TIMES DAILY 180 capsule  3  . famotidine (PEPCID) 10 MG tablet Take 10 mg by mouth as needed for heartburn.     . fish oil-omega-3 fatty acids 1000 MG capsule Take 1 g by mouth 2 (two) times daily.     . fluticasone (FLONASE) 50 MCG/ACT nasal spray Place 2 sprays into both nostrils daily. (Patient taking differently: Place 2 sprays into both nostrils daily as needed for allergies. ) 16 g 6  . KLOR-CON M20 20 MEQ tablet TAKE 1 TABLET BY MOUTH EVERY DAY 90 tablet 3  . Magnesium Oxide 500 MG TABS Take 750 mg by mouth daily.    . metFORMIN (GLUCOPHAGE-XR) 500 MG 24 hr tablet TAKE 1 TABLETS BY MOUTH  THREE TIMES DAILY. 270 tablet 0  . MULTIPLE VITAMIN PO Take 1 tablet by mouth every evening.     . nitroGLYCERIN (NITROSTAT) 0.4 MG SL tablet Place 1 tablet (0.4 mg total) under the tongue every 5 (five) minutes as needed for chest pain. 25 tablet 12  . ondansetron (ZOFRAN ODT) 4 MG disintegrating tablet Take 1 tablet (4 mg total) by mouth every 8 (eight) hours as needed for nausea or vomiting. 24 tablet 0  . ONETOUCH VERIO test strip USE TWICE A DAY 50 each 12  . PRADAXA 150 MG CAPS capsule TAKE 1 CAPSULE BY MOUTH  EVERY 12 HOURS 180 capsule 1  . tamsulosin (FLOMAX) 0.4 MG CAPS capsule TAKE 1 CAPSULE BY MOUTH EVERY DAY 30 capsule 5   Current Facility-Administered Medications on File Prior to Visit  Medication Dose Route Frequency Provider Last Rate Last Dose  . ipratropium-albuterol (DUONEB) 0.5-2.5 (3) MG/3ML nebulizer solution 3 mL  3 mL Nebulization Q6H Panosh, Standley Brooking, MD   3  mL at 01/02/18 1545   Allergies  Allergen Reactions  . Penicillins     Childhood reaction-details unknown  . Procaine Hcl Hives and Other (See Comments)    Red in the face and neck.  First generation in 1950s, but after that, I have been able to tolerate novacaine and lidocaine.  . Pseudoephedrine Other (See Comments)    Patient went into afib  . Sulfonamide Derivatives     Childhood reaction   . Cardizem [Diltiazem] Rash  . Pseudoephedrine Hcl  Palpitations    ROS: See HPI for pertinent positives and negatives.  Physical Examination  Vitals:   09/25/18 0849  BP: 118/64  Pulse: (!) 54  Resp: 18  Temp: 97.6 F (36.4 C)  TempSrc: Oral  SpO2: 96%  Weight: 285 lb (129.3 kg)  Height: 6\' 1"  (1.854 m)   Body mass index is 37.6 kg/m.  General: A&O x 3, WD, obese male. HEENT: Grossly intact and WNL.  Pulmonary: Sym exp, respirations are non labored, good air movt, CTAB, no rales, rhonchi, or wheezing. Cardiac: Regular rhythm, bradycardic, no detected murmur.  Carotid Bruits Right Left   Negative Negative   Adominal aortic pulse is not palpable Radial pulses are 2+ palpable                          VASCULAR EXAM:                                                                                                         LE Pulses Right Left       FEMORAL  2+ palpable  2+ palpable        POPLITEAL  not palpable   1+ palpable       POSTERIOR TIBIAL  2+ palpable   2+ palpable        DORSALIS PEDIS      ANTERIOR TIBIAL 2+ palpable  2+ palpable     Gastrointestinal: soft, NTND, -G/R, - HSM, - masses palpated, - CVAT B. Musculoskeletal: M/S 5/5 throughout, Extremities without ischemic changes. Skin: No rashes, no ulcers, no cellulitis.  Multiple small varicosities at ankles.  Neurologic: CN 2-12 intact, Pain and light touch intact in extremities are intact. Motor exam as listed above. Psychiatric: Normal thought content, mood appropriate to clinical situation.    DATA  AAA Duplex (09/25/2018):  Previous size: Right CIA: 2.6 cm (Date: 08-27-17), Left CIA: 1.5 cm   Current size:  2.5 cm of abdominal aorta cm (Date: 09-25-18); Right CIA: 2.2 cm; Left CIA: 1.6 cm  08/07/16 CTA abd/pelvis: Moderate amount of atherosclerotic plaque within a normal caliber abdominal aorta, not resulting in a hemodynamically significant stenosis. Aortic Atherosclerosis (ICD10-170.0) Unchanged mild fusiform ectasia of the right common and  internal iliac arteries measuring approximately 2.3 cm and 1.4 cm in diameter respectively, similar to the 05/2014 examination.   Medical Decision Making  The patient is a 67 y.o. male who presents with symptomatic right common iliac artery ectasia with no increase in size, at 2.2 cm  today.   Based on this patient's exam and diagnostic studies, the patient will follow up in 18 months with the following studies: bilateral aortoiliac duplex.  Consideration for repair of a common iliac artery aneurysmwould be made when the size is 3.5 cm, and symptomatic status.         The patient was given information about AAA including signs, symptoms, treatment, and how to minimize the risk of enlargement and rupture of aneurysms.    I emphasized the importance of maximal medical management including strict control of blood pressure, blood glucose, and lipid levels, antiplatelet agents, obtaining regular exercise, and continued cessation of smoking.   The patient was advised to call 911 should the patient experience sudden onset abdominal or back pain.   Thank you for allowing [place] to participate in this patient's care.  Clemon Chambers, RN, MSN, FNP-C [facility] [place] Office: [phone]  Clinic Physician: Donzetta Matters  09/25/2018, 8:53 AM

## 2018-10-19 ENCOUNTER — Ambulatory Visit (INDEPENDENT_AMBULATORY_CARE_PROVIDER_SITE_OTHER): Payer: Medicare Other | Admitting: Internal Medicine

## 2018-10-19 ENCOUNTER — Encounter: Payer: Self-pay | Admitting: Internal Medicine

## 2018-10-19 VITALS — BP 128/82 | HR 69 | Temp 97.9°F | Wt 286.1 lb

## 2018-10-19 DIAGNOSIS — Z23 Encounter for immunization: Secondary | ICD-10-CM | POA: Diagnosis not present

## 2018-10-19 DIAGNOSIS — E1159 Type 2 diabetes mellitus with other circulatory complications: Secondary | ICD-10-CM | POA: Diagnosis not present

## 2018-10-19 DIAGNOSIS — J069 Acute upper respiratory infection, unspecified: Secondary | ICD-10-CM | POA: Diagnosis not present

## 2018-10-19 DIAGNOSIS — Z79899 Other long term (current) drug therapy: Secondary | ICD-10-CM | POA: Diagnosis not present

## 2018-10-19 LAB — POCT GLYCOSYLATED HEMOGLOBIN (HGB A1C): Hemoglobin A1C: 6.2 % — AB (ref 4.0–5.6)

## 2018-10-19 NOTE — Progress Notes (Signed)
Chief Complaint  Patient presents with  . Follow-up    A1c check today. Pt getting over recent head cold.     HPI: [name] [name]. 67 y.o. come in for Chronic disease management    DM  Taking metformin tid no se noted at this time    Dizzy   Better     Famotidine was helpful .    Still stable with weight    Recheck  uri flonase off and on   No sob wheezing today  Has used breo in past asks if should use if needed . No fever   ROS: See pertinent positives and negatives per HPI. Vision wont come back more  Hard of hearing and tends to lip read    Past Medical History:  Diagnosis Date  . Atrial fibrillation ([place]) 03/22/2009   a. s/p multiple DCCV; b. no coumadin due to low TE risk profile; c. Tikosyn Rx  . Coronary atherosclerosis of native coronary artery 11/2002   a. s/p stent to LAD 12/03; OM2 occluded at cath 12/03; d. myoview 5/10: no ischemia;  e. echo 7/11: EF 55%, BAE, mild RVE, PASP 41-45; Myoview was in March 2013. There was no ischemia or infarction, EF 51%   . Cutaneous abscess of back excluding buttocks 07/04/2014   Appears to stem from possibly a cyst very large area 6 cm contact surgeon office   . Diabetes mellitus without complication ([place])   . Drusen body    see opth note  . ERECTILE DYSFUNCTION 03/22/2009  . GERD 03/22/2009  . HYPERGLYCEMIA 04/25/2010  . HYPERLIPIDEMIA 03/22/2009  . 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 LATERAL EPICONDYLITIS, LEFT 10/24/2009  . LIVER FUNCTION TESTS, ABNORMAL 04/25/2010  . Local reaction to immunization 05/05/2012   minor resolving  zostavax   . Myocardial infarction ([place]) mi2003  . Numbness in left leg    foot related to back disease and surgery  . Obesity, unspecified 04/24/2009  . Perforated appendicitis with necrosis s/p open appendectomy 06/07/14 06/04/2014  . Renal cyst    Characterized by MRI as simple  . Ruptured suppurative appendicitis    2015   . SLEEP APNEA, OBSTRUCTIVE 03/22/2009     compliant with CPAP  . THROMBOCYTOPENIA 08/16/2010  . TOBACCO USE, QUIT 10/24/2009  . 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 Umbilical hernia     Family History  Problem Relation Age of Onset  . Thyroid disease Mother   . Ovarian cancer Mother   . Breast cancer Mother   . Lung cancer Father   . Cancer Father     Social History   Socioeconomic History  . Marital status: Married    Spouse name: Not on file  . Number of children: Not on file  . Years of education: Not on file  . Highest education level: Not on file  Occupational History  . Not on file  Social Needs  . Financial resource strain: Not on file  . Food insecurity:    Worry: Not on file    Inability: Not on file  . Transportation needs:    Medical: Not on file    Non-medical: Not on file  Tobacco Use  . Smoking status: Former Smoker    Packs/day: 2.00    Years: 42.00    Pack years: 84.00    Types: Cigarettes    Last attempt to quit: 12/30/2008    Years since quitting: 9.8  .  Smokeless tobacco: Never Used  . Tobacco comment: started at age 66; 1-2 ppd; quit 2010  Substance and Sexual Activity  . Alcohol use: No    Alcohol/week: 0.0 standard drinks    Comment: rarely; maybe 1 beer a year  . Drug use: No  . Sexual activity: Yes  Lifestyle  . Physical activity:    Days per week: Not on file    Minutes per session: Not on file  . Stress: Not on file  Relationships  . Social connections:    Talks on phone: Not on file    Gets together: Not on file    Attends religious service: Not on file    Active member of club or organization: Not on file    Attends meetings of clubs or organizations: Not on file    Relationship status: Not on file  Other Topics Concern  . Not on file  Social History Narrative   Retired paramedic   Regular exercise-yes not recently    Has children   Wife is overweight and has fibromyalgia and depression on disability doesn't go out much. Back surgery    Has older dog   Retired  from [employer] 31 years of service now 7 years    Caffeine- minimal    Outpatient Medications Prior to Visit  Medication Sig Dispense Refill  . acetaminophen (TYLENOL) 325 MG tablet Take 650 mg by mouth every 6 (six) hours as needed for mild pain.    [name] albuterol (PROVENTIL HFA;VENTOLIN HFA) 108 (90 Base) MCG/ACT inhaler Inhale 2 puffs into the lungs every 6 (six) hours as needed for wheezing or shortness of breath. 1 Inhaler 0  . ALPHAGAN P 0.1 % SOLN INSTILL 1 DROP INTO RIGHT EYE 3 TIMES A DAY  3  . atorvastatin (LIPITOR) 80 MG tablet TAKE 1 TABLET BY MOUTH  DAILY 90 tablet 1  . BELSOMRA 20 MG TABS TAKE 1 TABLET BY MOUTH AT  BEDTIME 90 tablet 0  . diphenhydrAMINE (BENADRYL) 25 MG tablet Take 25 mg by mouth at bedtime as needed for sleep.    [name] dofetilide (TIKOSYN) 500 MCG capsule TAKE 1 CAPSULE BY MOUTH TWO TIMES DAILY 180 capsule 3  . famotidine (PEPCID) 10 MG tablet Take 10 mg by mouth as needed for heartburn.     . fish oil-omega-3 fatty acids 1000 MG capsule Take 1 g by mouth 2 (two) times daily.     . fluticasone (FLONASE) 50 MCG/ACT nasal spray Place 2 sprays into both nostrils daily. (Patient taking differently: Place 2 sprays into both nostrils daily as needed for allergies. ) 16 g 6  . KLOR-CON M20 20 MEQ tablet TAKE 1 TABLET BY MOUTH EVERY DAY 90 tablet 3  . Magnesium Oxide 500 MG TABS Take 750 mg by mouth daily.    . metFORMIN (GLUCOPHAGE-XR) 500 MG 24 hr tablet TAKE 1 TABLETS BY MOUTH  THREE TIMES DAILY. 270 tablet 0  . MULTIPLE VITAMIN PO Take 1 tablet by mouth every evening.     . nitroGLYCERIN (NITROSTAT) 0.4 MG SL tablet Place 1 tablet (0.4 mg total) under the tongue every 5 (five) minutes as needed for chest pain. 25 tablet 12  . ondansetron (ZOFRAN ODT) 4 MG disintegrating tablet Take 1 tablet (4 mg total) by mouth every 8 (eight) hours as needed for nausea or vomiting. 24 tablet 0  . ONETOUCH VERIO test strip USE TWICE A DAY 50 each 12  . PRADAXA 150 MG CAPS capsule TAKE 1  CAPSULE  BY MOUTH  EVERY 12 HOURS 180 capsule 1  . tamsulosin (FLOMAX) 0.4 MG CAPS capsule TAKE 1 CAPSULE BY MOUTH EVERY DAY 30 capsule 5   Facility-Administered Medications Prior to Visit  Medication Dose Route Frequency Provider Last Rate Last Dose  . ipratropium-albuterol (DUONEB) 0.5-2.5 (3) MG/3ML nebulizer solution 3 mL  3 mL Nebulization Q6H Hlee Fringer, Standley Brooking, MD   3 mL at 01/02/18 1545     EXAM:  BP 128/82 (BP Location: Right Arm, Patient Position: Sitting, Cuff Size: Normal)   Pulse 69   Temp 97.9 F (36.6 C) (Oral)   Wt 286 lb 1.6 oz (129.8 kg)   SpO2 92%   BMI 37.75 kg/m   Body mass index is 37.75 kg/m.  GENERAL: vitals reviewed and listed above, alert, oriented, appears well hydrated and in no acute distress HEENT: atraumatic, conjunctiva  clear, no obvious abnormalities on inspection of external nose and ears tm clear face non tender nose congested NECK: no obvious masses on inspection palpation  LUNGS: clear to auscultation bilaterally, no wheezes, rales or rhonchi, good air movement CV: HRRR, no clubbing cyanosis or  peripheral edema nl cap refill  MS: moves all extremities without noticeable focal  abnormality PSYCH: pleasant and cooperative, no obvious depression or anxiety Lab Results  Component Value Date   WBC 9.6 07/30/2018   HGB 15.5 07/30/2018   HCT 47.9 07/30/2018   PLT 144 (L) 07/30/2018   GLUCOSE 152 (H) 07/30/2018   CHOL 91 06/25/2018   TRIG 158.0 (H) 06/25/2018   HDL 30.60 (L) 06/25/2018   LDLCALC 29 06/25/2018   ALT 65 (H) 07/30/2018   AST 32 07/30/2018   NA 140 07/30/2018   K 4.1 07/30/2018   CL 111 07/30/2018   CREATININE 1.20 07/30/2018   BUN 23 07/30/2018   CO2 21 (L) 07/30/2018   TSH 1.91 06/25/2018   PSA 0.93 06/25/2018   INR 1.31 12/16/2016   HGBA1C 6.2 (A) 10/19/2018   MICROALBUR 9.3 (H) 06/25/2018   BP Readings from Last 3 Encounters:  10/19/18 128/82  09/25/18 118/64  09/02/18 (!) 122/54   Wt Readings from Last 3  Encounters:  10/19/18 286 lb 1.6 oz (129.8 kg)  09/25/18 285 lb (129.3 kg)  09/02/18 282 lb 9.6 oz (128.2 kg)    ASSESSMENT AND PLAN:  Discussed the following assessment and plan:  Type 2 diabetes mellitus with other circulatory complications (HCC) - Plan: POC HgB A1c  Need for immunization against influenza - Plan: Flu vaccine HIGH DOSE PF (Fluzone High dose)  URI, acute  Medication management   Doing better   a1c at goal  Cont healthy weight loss  flonase daily use breo as needed for chest cold risk of  [place]  Consider hearing eval assistance  Look into it  Will need lfts fu repeated  -Patient advised to return or notify health care team  if  new concerns arise.  Patient Instructions  Your a1c is better   At goal.  Take flonase  every day .    Can add breo . With chest colds and wheezing .  ROV  in 4 months   Call ahead  About lab pre visit .     Standley Brooking. Jabril Pursell M.D.

## 2018-10-19 NOTE — Patient Instructions (Addendum)
Your a1c is better   At goal.  Take flonase  every day .    Can add breo . With chest colds and wheezing .  ROV  in 4 months   Call ahead  About lab pre visit .

## 2018-12-04 ENCOUNTER — Ambulatory Visit (INDEPENDENT_AMBULATORY_CARE_PROVIDER_SITE_OTHER): Payer: Medicare Other | Admitting: Pulmonary Disease

## 2018-12-04 ENCOUNTER — Encounter: Payer: Self-pay | Admitting: Pulmonary Disease

## 2018-12-04 VITALS — BP 138/88 | HR 63 | Ht 72.0 in | Wt 283.0 lb

## 2018-12-04 DIAGNOSIS — J441 Chronic obstructive pulmonary disease with (acute) exacerbation: Secondary | ICD-10-CM | POA: Diagnosis not present

## 2018-12-04 DIAGNOSIS — G4733 Obstructive sleep apnea (adult) (pediatric): Secondary | ICD-10-CM | POA: Diagnosis not present

## 2018-12-04 DIAGNOSIS — E669 Obesity, unspecified: Secondary | ICD-10-CM

## 2018-12-04 DIAGNOSIS — J432 Centrilobular emphysema: Secondary | ICD-10-CM | POA: Diagnosis not present

## 2018-12-04 DIAGNOSIS — G4730 Sleep apnea, unspecified: Secondary | ICD-10-CM

## 2018-12-04 MED ORDER — DOXYCYCLINE HYCLATE 100 MG PO TABS: 100.0000 mg | ORAL_TABLET | Freq: Two times a day (BID) | ORAL | 0 refills | Status: DC

## 2018-12-04 MED ORDER — PREDNISONE 10 MG PO TABS: ORAL_TABLET | ORAL | 0 refills | Status: DC

## 2018-12-04 NOTE — Patient Instructions (Signed)
Doxycycline 100 mg pill in the morning and evening for 7 days  Prednisone 10 mg pill >> 3 pills daily for 2 days, 2 pill daily for 2 days, 1 pill daily for 2 days  Follow up in 2 months

## 2018-12-04 NOTE — Progress Notes (Signed)
[name] [name], [name] [name], [name] [name]  Chief Complaint  Patient presents with  . Follow-up    Pt doing well overall with cpap machine. Pt states he has increase productive cough-thick clear, chest tightness [name] sinus pressure.    Constitutional:  BP 138/88 (BP Location: Left Arm, Cuff Size: Normal)   Pulse 63   Ht 6' (1.829 m)   Wt 283 lb (128.4 kg)   SpO2 94%   BMI 38.38 kg/m   Past Medical History:  A fib, CAD, DM, ED, GERD, HLD  Brief Summary:  [name] Wey. is a 67 y.o. male with OSA [name] emphysema.  He was started on inhalers by his PCP.  These have helped.  He got a cold during Thanksgiving.  He is still having sinus congestion, cough with clear sputum, [name] chest congestion.  He has been getting some wheeze.  He isn't sure if he had a fever.  Not having abdominal pain, chest pain, diarrhea, or leg swelling.  He uses CPAP nightly.  Has more trouble with CPAP since has sinus congestion.  Physical Exam:   Appearance - well kempt   ENMT - clear nasal mucosa, midline nasal  septum, no oral exudates, no LAN, trachea midline, clear nasal drainage  Respiratory - normal chest wall, normal respiratory effort, no accessory muscle use, b/l expiratory wheeze with scattered rhonchi  CV - s1s2 regular rate [name] rhythm, no murmurs, no peripheral edema, radial pulses symmetric  GI - soft, non tender, no masses  Lymph - no adenopathy noted in neck [name] axillary areas  MSK - normal gait  Ext - no cyanosis, clubbing, or joint inflammation noted  Skin - no rashes, lesions, or ulcers  Neuro - normal strength, oriented x 3  Psych - normal mood [name] affect   Assessment/Plan:   COPD exacerbation. - will give him course of doxycycline [name] prednisone  COPD with emphysema. - continue breo [name] prn albuterol  Lung nodule. - he has f/u CT chest w/o contrast scheduled for February 2020  Obstructive sleep apnea. - he is compliant with CPAP [name] reports benefit -  continue auto CPAP - if his AHI continues to run high, then he might need in lab titration study  Insomnia. - belsomra through PCP  Patient Instructions  Doxycycline 100 mg pill in the morning [name] evening for 7 days  Prednisone 10 mg pill >> 3 pills daily for 2 days, 2 pill daily for 2 days, 1 pill daily for 2 days  Follow up in 2 months    [name] Mires, MD [place] Pager: [phone] 12/04/2018, 4:16 PM  Flow Sheet     [name] tests:  HRCT chest 02/25/18 >> atherosclerosis, enlarged PA, centrilobular [name] paraseptal emphysema, patchy GGO, mild traction BTX, 3 mm nodule RML PFT 03/25/18 >> FEV1 2.93 (85%),FEV1% 81, TLC 5.84 (80%), DLCO 46% A1AT 03/25/18 >> 139, MM  Sleep tests:  PSG 12/26/02 >> AHI 25 Auto CPAP 11/03/18 to 12/02/18 >> used on 30 of 30 nights with average 4 hrs 24 min.  Average AHI 17.8 with median CPAP 15 [name] 95 th percentile CPAP 19 cm H2O  Cardiac tests:  Echo 06/06/16 >> EF 55 to 60%, grade 1 DD  Medications:   Allergies as of 12/04/2018      Reactions   Penicillins    Childhood reaction-details unknown   Procaine Hcl Hives, Other (See Comments)   Red in the face [name] neck.  First generation in 1950s, but after that,  I have been able to tolerate novacaine [name] lidocaine.   Pseudoephedrine Other (See Comments)   Patient went into afib   Sulfonamide Derivatives    Childhood reaction    Cardizem [diltiazem] Rash   Pseudoephedrine Hcl Palpitations      Medication List        Accurate as of 12/04/18  4:16 PM. Always use your most recent med list.          acetaminophen 325 MG tablet Commonly known as:  TYLENOL Take 650 mg by mouth every 6 (six) hours as needed for mild pain.   albuterol 108 (90 Base) MCG/ACT inhaler Commonly known as:  PROVENTIL HFA;VENTOLIN HFA Inhale 2 puffs into the lungs every 6 (six) hours as needed for wheezing or shortness of breath.   ALPHAGAN P 0.1 % Soln Generic drug:  brimonidine INSTILL 1  DROP INTO RIGHT EYE 3 TIMES A DAY   atorvastatin 80 MG tablet Commonly known as:  LIPITOR TAKE 1 TABLET BY MOUTH  DAILY   BELSOMRA 20 MG Tabs Generic drug:  Suvorexant TAKE 1 TABLET BY MOUTH AT  BEDTIME   BREO ELLIPTA 200-25 MCG/INH Aepb Generic drug:  fluticasone furoate-vilanterol Inhale 1 puff into the lungs daily.   diphenhydrAMINE 25 MG tablet Commonly known as:  BENADRYL Take 25 mg by mouth at bedtime as needed for sleep.   dofetilide 500 MCG capsule Commonly known as:  TIKOSYN TAKE 1 CAPSULE BY MOUTH TWO TIMES DAILY   doxycycline 100 MG tablet Commonly known as:  VIBRA-TABS Take 1 tablet (100 mg total) by mouth 2 (two) times daily.   famotidine 10 MG tablet Commonly known as:  PEPCID Take 10 mg by mouth as needed for heartburn.   fish oil-omega-3 fatty acids 1000 MG capsule Take 1 g by mouth 2 (two) times daily.   fluticasone 50 MCG/ACT nasal spray Commonly known as:  FLONASE Place 2 sprays into both nostrils daily.   KLOR-CON M20 20 MEQ tablet Generic drug:  potassium chloride SA TAKE 1 TABLET BY MOUTH EVERY DAY   Magnesium Oxide 500 MG Tabs Take 750 mg by mouth daily.   metFORMIN 500 MG 24 hr tablet Commonly known as:  GLUCOPHAGE-XR TAKE 1 TABLETS BY MOUTH  THREE TIMES DAILY.   MULTIPLE VITAMIN PO Take 1 tablet by mouth every evening.   nitroGLYCERIN 0.4 MG SL tablet Commonly known as:  NITROSTAT Place 1 tablet (0.4 mg total) under the tongue every 5 (five) minutes as needed for chest pain.   ondansetron 4 MG disintegrating tablet Commonly known as:  ZOFRAN-ODT Take 1 tablet (4 mg total) by mouth every 8 (eight) hours as needed for nausea or vomiting.   ONETOUCH VERIO test strip Generic drug:  glucose blood USE TWICE A DAY   PRADAXA 150 MG Caps capsule Generic drug:  dabigatran TAKE 1 CAPSULE BY MOUTH  EVERY 12 HOURS   predniSONE 10 MG tablet Commonly known as:  DELTASONE 3 pills daily for 2 days, 2 pill daily for 2 days, 1 pill daily for  2 days   tamsulosin 0.4 MG Caps capsule Commonly known as:  FLOMAX TAKE 1 CAPSULE BY MOUTH EVERY DAY       Past Surgical History:  He  has a past surgical history that includes cyst on epiglottis (08/2002); surgery l4-l5  (1998); stent; ulnar neuropathy; Umbilical hernia repair; Lumbar disc surgery; Esophagogastroduodenoscopy (11/15/2011); Colonoscopy (11/15/2011); laparoscopic appendectomy (N/A, 06/06/2014); Colon surgery; Appendectomy (N/A, 06/06/2014); [name] Colonoscopy with propofol (N/A, 01/03/2017).  Family  History:  His family history includes Breast cancer in his mother; Cancer in his father; Lung cancer in his father; Ovarian cancer in his mother; Thyroid disease in his mother.  Social History:  He  reports that he quit smoking about 9 years ago. His smoking use included cigarettes. He has a 84.00 pack-year smoking history. He has never used smokeless tobacco. He reports that he does not drink alcohol or use drugs.

## 2019-01-14 ENCOUNTER — Other Ambulatory Visit: Payer: Self-pay | Admitting: Cardiology

## 2019-01-15 ENCOUNTER — Other Ambulatory Visit: Payer: Self-pay | Admitting: Internal Medicine

## 2019-01-15 ENCOUNTER — Other Ambulatory Visit: Payer: Self-pay

## 2019-01-21 NOTE — Progress Notes (Signed)
HPI: FU hx of CAD s/p stent to LAD in 2003, parox AFib, HL, OSA. He has seen Dr. Rayann Heman and ablation could be considered if he lost weight. Previous rash with cardizem; on tikosyn. Echo 6/17 showed normal function; grade 1 DD; mild LAE. Has had previous cardioversions for atrial fibrillation. Carotid Dopplers June 2019 showed 1 to 39% bilateral stenosis.  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  Since last seen,the patient has dyspnea with more extreme activities but not with routine activities. It is relieved with rest. It is not associated with chest pain. There is no orthopnea, PND or pedal edema. There is no syncope or palpitations. There is no exertional chest pain.   Current Outpatient Medications  Medication Sig Dispense Refill  . acetaminophen (TYLENOL) 325 MG tablet Take 650 mg by mouth every 6 (six) hours as needed for mild pain.    [name] albuterol (PROVENTIL HFA;VENTOLIN HFA) 108 (90 Base) MCG/ACT inhaler Inhale 2 puffs into the lungs every 6 (six) hours as needed for wheezing or shortness of breath. 1 Inhaler 0  . ALPHAGAN P 0.1 % SOLN INSTILL 1 DROP INTO RIGHT EYE 3 TIMES A DAY  3  . atorvastatin (LIPITOR) 80 MG tablet TAKE 1 TABLET BY MOUTH  DAILY 90 tablet 1  . BELSOMRA 20 MG TABS TAKE 1 TABLET BY MOUTH AT  BEDTIME 90 tablet 0  . diphenhydrAMINE (BENADRYL) 25 MG tablet Take 25 mg by mouth at bedtime as needed for sleep.    [name] dofetilide (TIKOSYN) 500 MCG capsule TAKE 1 CAPSULE BY MOUTH  TWICE DAILY 180 capsule 1  . doxycycline (VIBRA-TABS) 100 MG tablet Take 1 tablet (100 mg total) by mouth 2 (two) times daily. 14 tablet 0  . famotidine (PEPCID) 10 MG tablet Take 10 mg by mouth as needed for heartburn.     . fish oil-omega-3 fatty acids 1000 MG capsule Take 1 g by mouth 2 (two) times daily.     . fluticasone (FLONASE) 50 MCG/ACT nasal spray Place 2 sprays into both nostrils daily. (Patient taking differently: Place 2 sprays into both nostrils daily as needed for  allergies. ) 16 g 6  . fluticasone furoate-vilanterol (BREO ELLIPTA) 200-25 MCG/INH AEPB Inhale 1 puff into the lungs daily.    [name] KLOR-CON M20 20 MEQ tablet TAKE 1 TABLET BY MOUTH EVERY DAY 90 tablet 3  . Magnesium Oxide 500 MG TABS Take 750 mg by mouth daily.    . metFORMIN (GLUCOPHAGE-XR) 500 MG 24 hr tablet TAKE 1 TABLETS BY MOUTH 3  TIMES DAILY. 270 tablet 0  . MULTIPLE VITAMIN PO Take 1 tablet by mouth every evening.     . nitroGLYCERIN (NITROSTAT) 0.4 MG SL tablet Place 1 tablet (0.4 mg total) under the tongue every 5 (five) minutes as needed for chest pain. 25 tablet 12  . ondansetron (ZOFRAN ODT) 4 MG disintegrating tablet Take 1 tablet (4 mg total) by mouth every 8 (eight) hours as needed for nausea or vomiting. 24 tablet 0  . ONETOUCH VERIO test strip USE TWICE A DAY 50 each 12  . PRADAXA 150 MG CAPS capsule TAKE 1 CAPSULE BY MOUTH  EVERY 12 HOURS 180 capsule 1  . predniSONE (DELTASONE) 10 MG tablet 3 pills daily for 2 days, 2 pill daily for 2 days, 1 pill daily for 2 days 12 tablet 0  . tamsulosin (FLOMAX) 0.4 MG CAPS capsule TAKE 1 CAPSULE BY MOUTH EVERY DAY 30 capsule 5   Current  Facility-Administered Medications  Medication Dose Route Frequency Provider Last Rate Last Dose  . ipratropium-albuterol (DUONEB) 0.5-2.5 (3) MG/3ML nebulizer solution 3 mL  3 mL Nebulization Q6H Panosh, Standley Brooking, MD   3 mL at 01/02/18 1545     Past Medical History:  Diagnosis Date  . Atrial fibrillation ([place]) 03/22/2009   a. s/p multiple DCCV; b. no coumadin due to low TE risk profile; c. Tikosyn Rx  . Coronary atherosclerosis of native coronary artery 11/2002   a. s/p stent to LAD 12/03; OM2 occluded at cath 12/03; d. myoview 5/10: no ischemia;  e. echo 7/11: EF 55%, BAE, mild RVE, PASP 41-45; Myoview was in March 2013. There was no ischemia or infarction, EF 51%   . Cutaneous abscess of back excluding buttocks 07/04/2014   Appears to stem from possibly a cyst very large area 6 cm contact surgeon office    . Diabetes mellitus without complication ([place])   . Drusen body    see opth note  . ERECTILE DYSFUNCTION 03/22/2009  . GERD 03/22/2009  . HYPERGLYCEMIA 04/25/2010  . HYPERLIPIDEMIA 03/22/2009  . 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 LATERAL EPICONDYLITIS, LEFT 10/24/2009  . LIVER FUNCTION TESTS, ABNORMAL 04/25/2010  . Local reaction to immunization 05/05/2012   minor resolving  zostavax   . Myocardial infarction ([place]) mi2003  . Numbness in left leg    foot related to back disease and surgery  . Obesity, unspecified 04/24/2009  . Perforated appendicitis with necrosis s/p open appendectomy 06/07/14 06/04/2014  . Renal cyst    Characterized by MRI as simple  . Ruptured suppurative appendicitis    2015   . SLEEP APNEA, OBSTRUCTIVE 03/22/2009   compliant with CPAP  . THROMBOCYTOPENIA 08/16/2010  . TOBACCO USE, QUIT 10/24/2009  . 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 Umbilical hernia     Past Surgical History:  Procedure Laterality Date  . APPENDECTOMY N/A 06/06/2014   Procedure: APPENDECTOMY;  Surgeon: Odis Hollingshead, MD;  Location: [facility];  Service: General;  Laterality: N/A;  . COLON SURGERY    . COLONOSCOPY  11/15/2011   Procedure: COLONOSCOPY;  Surgeon: Juanita Craver, MD;  Location: [facility];  Service: Endoscopy;  Laterality: N/A;  . COLONOSCOPY WITH PROPOFOL N/A 01/03/2017   Procedure: COLONOSCOPY WITH PROPOFOL;  Surgeon: Carol Ada, MD;  Location: [facility];  Service: Endoscopy;  Laterality: N/A;  . cyst on epiglottis  08/2002  . ESOPHAGOGASTRODUODENOSCOPY  11/15/2011   Procedure: ESOPHAGOGASTRODUODENOSCOPY (EGD);  Surgeon: Juanita Craver, MD;  Location: [facility];  Service: Endoscopy;  Laterality: N/A;  . LAPAROSCOPIC APPENDECTOMY N/A 06/06/2014   Procedure: APPENDECTOMY LAPAROSCOPIC attemted;  Surgeon: Odis Hollingshead, MD;  Location: [facility];  Service: General;  Laterality: N/A;  . LUMBAR DISC SURGERY     two  holes in spinalcovering  . stent      LAD DUS 2004  . surgery l4-l5   1998   ruptured x 3  . ulnar neuropathy    . UMBILICAL HERNIA REPAIR     mesh    Social History   Socioeconomic History  . Marital status: Married    Spouse name: Not on file  . Number of children: Not on file  . Years of education: Not on file  . Highest education level: Not on file  Occupational History  . Not on file  Social Needs  . Financial resource strain: Not on file  . Food insecurity:  Worry: Not on file    Inability: Not on file  . Transportation needs:    Medical: Not on file    Non-medical: Not on file  Tobacco Use  . Smoking status: Former Smoker    Packs/day: 2.00    Years: 42.00    Pack years: 84.00    Types: Cigarettes    Last attempt to quit: 12/30/2008    Years since quitting: 10.0  . Smokeless tobacco: Never Used  . Tobacco comment: started at age 89; 1-2 ppd; quit 2010  Substance and Sexual Activity  . Alcohol use: No    Alcohol/week: 0.0 standard drinks    Comment: rarely; maybe 1 beer a year  . Drug use: No  . Sexual activity: Yes  Lifestyle  . Physical activity:    Days per week: Not on file    Minutes per session: Not on file  . Stress: Not on file  Relationships  . Social connections:    Talks on phone: Not on file    Gets together: Not on file    Attends religious service: Not on file    Active member of club or organization: Not on file    Attends meetings of clubs or organizations: Not on file    Relationship status: Not on file  . Intimate partner violence:    Fear of current or ex partner: Not on file    Emotionally abused: Not on file    Physically abused: Not on file    Forced sexual activity: Not on file  Other Topics Concern  . Not on file  Social History Narrative   Retired paramedic   Regular exercise-yes not recently    Has children   Wife is overweight and has fibromyalgia and depression on disability doesn't go out much. Back surgery    Has older dog   Retired from [employer]  years of service now 7 years    Caffeine- minimal    Family History  Problem Relation Age of Onset  . Thyroid disease Mother   . Ovarian cancer Mother   . Breast cancer Mother   . Lung cancer Father   . Cancer Father     ROS: no fevers or chills, productive cough, hemoptysis, dysphasia, odynophagia, melena, hematochezia, dysuria, hematuria, rash, seizure activity, orthopnea, PND, pedal edema, claudication. Remaining systems are negative.  Physical Exam: Well-developed obese in no acute distress.  Skin is warm and dry.  HEENT is normal.  Neck is supple.  Chest is clear to auscultation with normal expansion.  Cardiovascular exam is regular rate and rhythm.  Abdominal exam nontender or distended. No masses palpated. Extremities show no edema. neuro grossly intact  ECG-sinus bradycardia, left axis deviation.  Personally reviewed  A/P  1 paroxysmal atrial fibrillation-patient remains in sinus rhythm today on examination.  Continue Tikosyn and Pradaxa.  We can consider reassessment for ablation if he has more frequent episodes in the future.  Check potassium, magnesium, renal function and hemoglobin.  2 coronary artery disease-plan to continue statin.  No chest pain.  He is not on aspirin given need for anticoagulation.  3 hyperlipidemia-continue statin.  4 history of iliac artery aneurysm-followed by vascular surgery.  5 morbid obesity-he has now lost 75 pounds over the past 3-1/2 years.  I encouraged him to continue his efforts.  He is exercising routinely as well.  [name], MD

## 2019-01-22 ENCOUNTER — Telehealth: Payer: Self-pay | Admitting: Internal Medicine

## 2019-01-22 DIAGNOSIS — E1159 Type 2 diabetes mellitus with other circulatory complications: Secondary | ICD-10-CM

## 2019-01-22 DIAGNOSIS — G479 Sleep disorder, unspecified: Secondary | ICD-10-CM

## 2019-01-22 DIAGNOSIS — E7800 Pure hypercholesterolemia, unspecified: Secondary | ICD-10-CM

## 2019-01-22 DIAGNOSIS — I4891 Unspecified atrial fibrillation: Secondary | ICD-10-CM

## 2019-01-22 DIAGNOSIS — Z79899 Other long term (current) drug therapy: Secondary | ICD-10-CM

## 2019-01-22 NOTE — Telephone Encounter (Signed)
Please advise Dr Panosh, thanks.   

## 2019-01-22 NOTE — Telephone Encounter (Signed)
Copied from [place] [id number]. Topic: Quick Communication - See Telephone Encounter >> Jan 22, 2019  3:13 PM Blase Mess A wrote: CRM for notification. See Telephone encounter for: 01/22/19.  If needing labs done prior to this visit please advise.Patient is calling to have labs ordered prior to his 02/12/2019 appt Please advise [phone]

## 2019-01-22 NOTE — Telephone Encounter (Signed)
Orders have been placed.

## 2019-02-01 ENCOUNTER — Encounter: Payer: Self-pay | Admitting: Cardiology

## 2019-02-01 ENCOUNTER — Ambulatory Visit (INDEPENDENT_AMBULATORY_CARE_PROVIDER_SITE_OTHER): Payer: Medicare Other | Admitting: Cardiology

## 2019-02-01 VITALS — BP 126/70 | HR 56 | Ht 73.0 in | Wt 285.0 lb

## 2019-02-01 DIAGNOSIS — I480 Paroxysmal atrial fibrillation: Secondary | ICD-10-CM | POA: Diagnosis not present

## 2019-02-01 DIAGNOSIS — E7800 Pure hypercholesterolemia, unspecified: Secondary | ICD-10-CM | POA: Diagnosis not present

## 2019-02-01 DIAGNOSIS — I2510 Atherosclerotic heart disease of native coronary artery without angina pectoris: Secondary | ICD-10-CM | POA: Diagnosis not present

## 2019-02-01 MED ORDER — NITROGLYCERIN 0.4 MG SL SUBL: 0.4000 mg | SUBLINGUAL_TABLET | SUBLINGUAL | 12 refills | Status: AC | PRN

## 2019-02-01 NOTE — Patient Instructions (Signed)
Medication Instructions:  NO CHANGE If you need a refill on your cardiac medications before your next appointment, please call your pharmacy.   Lab work: Your physician recommends that you HAVE LAB WORK TODAY If you have labs (blood work) drawn today and your tests are completely normal, you will receive your results only by: [name] MyChart Message (if you have MyChart) OR . A paper copy in the mail If you have any lab test that is abnormal or we need to change your treatment, we will call you to review the results.  Follow-Up: At [facility], you and your health needs are our priority.  As part of our continuing mission to provide you with exceptional heart care, we have created designated Provider Care Teams.  These Care Teams include your primary Cardiologist (physician) and Advanced Practice Providers (APPs -  Physician Assistants and Nurse Practitioners) who all work together to provide you with the care you need, when you need it. You will need a follow up appointment in 6 months.  Please call our office 2 months in advance to schedule this appointment.  You may see [name] MD or one of the following Advanced Practice Providers on your designated Care Team:   Kerin Ransom, PA-C Roby Lofts, [place] . Sande Rives, PA-C CALL IN June TO SCHEDULE APPOINTMENT FOR [place]

## 2019-02-02 LAB — BASIC METABOLIC PANEL
BUN/Creatinine Ratio: 14 (ref 10–24)
BUN: 16 mg/dL (ref 8–27)
CO2: 24 mmol/L (ref 20–29)
Calcium: 10.3 mg/dL — ABNORMAL HIGH (ref 8.6–10.2)
Chloride: 102 mmol/L (ref 96–106)
Creatinine, Ser: 1.13 mg/dL (ref 0.76–1.27)
GFR calc Af Amer: 76 mL/min/{1.73_m2} (ref >59)
GFR calc non Af Amer: 66 mL/min/{1.73_m2} (ref >59)
Glucose: 118 mg/dL — ABNORMAL HIGH (ref 65–99)
Potassium: 4.6 mmol/L (ref 3.5–5.2)
Sodium: 141 mmol/L (ref 134–144)

## 2019-02-02 LAB — CBC
Hematocrit: 44.4 % (ref 37.5–51.0)
Hemoglobin: 15.5 g/dL (ref 13.0–17.7)
MCH: 31.4 pg (ref 26.6–33.0)
MCHC: 34.9 g/dL (ref 31.5–35.7)
MCV: 90 fL (ref 79–97)
Platelets: 167 10*3/uL (ref 150–450)
RBC: 4.93 x10E6/uL (ref 4.14–5.80)
RDW: 12.6 % (ref 11.6–15.4)
WBC: 8.1 10*3/uL (ref 3.4–10.8)

## 2019-02-02 LAB — MAGNESIUM: Magnesium: 1.9 mg/dL (ref 1.6–2.3)

## 2019-02-03 ENCOUNTER — Other Ambulatory Visit: Payer: Self-pay | Admitting: Internal Medicine

## 2019-02-03 ENCOUNTER — Ambulatory Visit (INDEPENDENT_AMBULATORY_CARE_PROVIDER_SITE_OTHER)
Admission: RE | Admit: 2019-02-03 | Discharge: 2019-02-03 | Disposition: A | Discharge status: Home / Self Care | Payer: Medicare Other | Source: Ambulatory Visit | Attending: Pulmonary Disease | Admitting: Pulmonary Disease

## 2019-02-03 DIAGNOSIS — R918 Other nonspecific abnormal finding of lung field: Secondary | ICD-10-CM

## 2019-02-08 ENCOUNTER — Other Ambulatory Visit (INDEPENDENT_AMBULATORY_CARE_PROVIDER_SITE_OTHER): Payer: Medicare Other

## 2019-02-08 DIAGNOSIS — E1159 Type 2 diabetes mellitus with other circulatory complications: Secondary | ICD-10-CM | POA: Diagnosis not present

## 2019-02-08 DIAGNOSIS — Z79899 Other long term (current) drug therapy: Secondary | ICD-10-CM | POA: Diagnosis not present

## 2019-02-08 DIAGNOSIS — I4891 Unspecified atrial fibrillation: Secondary | ICD-10-CM

## 2019-02-08 DIAGNOSIS — E7800 Pure hypercholesterolemia, unspecified: Secondary | ICD-10-CM | POA: Diagnosis not present

## 2019-02-08 DIAGNOSIS — G479 Sleep disorder, unspecified: Secondary | ICD-10-CM

## 2019-02-08 LAB — HEPATIC FUNCTION PANEL
ALT: 48 U/L (ref 0–53)
AST: 30 U/L (ref 0–37)
Albumin: 4.3 g/dL (ref 3.5–5.2)
Alkaline Phosphatase: 60 U/L (ref 39–117)
Bilirubin, Direct: 0.2 mg/dL (ref 0.0–0.3)
Total Bilirubin: 0.8 mg/dL (ref 0.2–1.2)
Total Protein: 7 g/dL (ref 6.0–8.3)

## 2019-02-08 LAB — BASIC METABOLIC PANEL
BUN: 20 mg/dL (ref 6–23)
CO2: 31 mEq/L (ref 19–32)
Calcium: 9.8 mg/dL (ref 8.4–10.5)
Chloride: 103 mEq/L (ref 96–112)
Creatinine, Ser: 1.05 mg/dL (ref 0.40–1.50)
GFR: 69.98 mL/min (ref >60.00)
Glucose, Bld: 99 mg/dL (ref 70–99)
Potassium: 4.5 mEq/L (ref 3.5–5.1)
Sodium: 141 mEq/L (ref 135–145)

## 2019-02-08 LAB — LIPID PANEL
Cholesterol: 85 mg/dL (ref 0–200)
HDL: 30 mg/dL — ABNORMAL LOW (ref >39.00)
LDL Cholesterol: 33 mg/dL (ref 0–99)
NonHDL: 54.67
Total CHOL/HDL Ratio: 3
Triglycerides: 110 mg/dL (ref 0.0–149.0)
VLDL: 22 mg/dL (ref 0.0–40.0)

## 2019-02-08 LAB — CBC WITH DIFFERENTIAL/PLATELET
Basophils Absolute: 0 10*3/uL (ref 0.0–0.1)
Basophils Relative: 0.3 % (ref 0.0–3.0)
Eosinophils Absolute: 0.4 10*3/uL (ref 0.0–0.7)
Eosinophils Relative: 5.6 % — ABNORMAL HIGH (ref 0.0–5.0)
HCT: 46.8 % (ref 39.0–52.0)
Hemoglobin: 15.6 g/dL (ref 13.0–17.0)
Lymphocytes Relative: 33.3 % (ref 12.0–46.0)
Lymphs Abs: 2.6 10*3/uL (ref 0.7–4.0)
MCHC: 33.3 g/dL (ref 30.0–36.0)
MCV: 92 fl (ref 78.0–100.0)
Monocytes Absolute: 0.5 10*3/uL (ref 0.1–1.0)
Monocytes Relative: 6.9 % (ref 3.0–12.0)
Neutro Abs: 4.2 10*3/uL (ref 1.4–7.7)
Neutrophils Relative %: 53.9 % (ref 43.0–77.0)
Platelets: 154 10*3/uL (ref 150.0–400.0)
RBC: 5.09 Mil/uL (ref 4.22–5.81)
RDW: 14 % (ref 11.5–15.5)
WBC: 7.8 10*3/uL (ref 4.0–10.5)

## 2019-02-08 LAB — MICROALBUMIN / CREATININE URINE RATIO
Creatinine,U: 122.6 mg/dL
Microalb Creat Ratio: 15.3 mg/g (ref 0.0–30.0)
Microalb, Ur: 18.7 mg/dL — ABNORMAL HIGH (ref 0.0–1.9)

## 2019-02-08 LAB — HEMOGLOBIN A1C: Hgb A1c MFr Bld: 6.7 % — ABNORMAL HIGH (ref 4.6–6.5)

## 2019-02-10 ENCOUNTER — Encounter: Payer: Self-pay | Admitting: Pulmonary Disease

## 2019-02-10 ENCOUNTER — Ambulatory Visit (INDEPENDENT_AMBULATORY_CARE_PROVIDER_SITE_OTHER): Payer: Medicare Other | Admitting: Pulmonary Disease

## 2019-02-10 VITALS — BP 132/68 | HR 59 | Ht 73.0 in | Wt 284.6 lb

## 2019-02-10 DIAGNOSIS — J849 Interstitial pulmonary disease, unspecified: Secondary | ICD-10-CM

## 2019-02-10 DIAGNOSIS — R918 Other nonspecific abnormal finding of lung field: Secondary | ICD-10-CM

## 2019-02-10 DIAGNOSIS — G4730 Sleep apnea, unspecified: Secondary | ICD-10-CM | POA: Diagnosis not present

## 2019-02-10 DIAGNOSIS — E669 Obesity, unspecified: Secondary | ICD-10-CM

## 2019-02-10 DIAGNOSIS — J432 Centrilobular emphysema: Secondary | ICD-10-CM

## 2019-02-10 NOTE — Progress Notes (Signed)
[facility]  Chief Complaint  Patient presents with  . Follow-up    pt uses CPAP every night; occasionally has to adjust straps due to tossing and turning, but he enjoys his mask; no issues with pressure settings; been walking 2 miles/day and working out since last visit, states gets winded w/ exertion, but not w/o exertion    Constitutional:  BP 132/68 (BP Location: Right Arm, Cuff Size: Normal)   Pulse (!) 59   Ht 6\' 1"  (1.854 m)   Wt 284 lb 9.6 oz (129.1 kg)   SpO2 94%   BMI 37.55 kg/m   Past Medical History:  A fib, CAD, DM, ED, GERD, HLD  Brief Summary:  [name] [name]. is a 68 y.o. male with OSA and emphysema.  He had CT chest done 02/04/19.  Reviewed by me.  Has TAA 4.5 cm, stable nodule, stable changes of ILD and emphysema.  He is trying to exercise more.  Doesn't have much cough.  Not having sputum, chest pain, wheeze, or leg swelling.    Uses CPAP nightly.  Feels mask fit is okay.  Physical Exam:   Appearance - well kempt   ENMT - no sinus tenderness, no nasal discharge, no oral exudate  Neck - no masses, trachea midline, no thyromegaly, no elevation in JVP  Respiratory - normal appearance of chest wall, normal respiratory effort w/o accessory muscle use, no dullness on percussion, no wheezing or rales  CV - s1s2 regular rate and rhythm, no murmurs, no peripheral edema, radial pulses symmetric  GI - soft, non tender  Lymph - no adenopathy noted in neck and axillary areas  MSK - normal gait  Ext - no cyanosis, clubbing, or joint inflammation noted  Skin - no rashes, lesions, or ulcers  Neuro - normal strength, oriented x 3  Psych - normal mood and affect    Assessment/Plan:   COPD with emphysema. - continue breo and prn albuterol  Lung nodule. - follow up CT chest in February 2021  Interstitial lung disease. - stable clinically and on imaging studies. - f/u CT chest in February 2021  Obstructive  sleep apnea. - he is compliant with CPAP and reports benefit - AHI still elevated on download, but he feels set up is okay - continue auto CPAP - if he has more trouble with CPAP, then might need in lab titration study  Insomnia. - belsomra through PCP  Thoracic aortic aneurysm. - will f/u CT chest in February 2021 - advised him to also d/w his cardiologist   Patient Instructions  Will schedule CT chest for February 2021 and follow up after that  A total of  29 minutes were spent face to face with the patient and more than half of that time involved counseling or coordination of care.  Chesley Mires, MD [facility] Pager: [phone] 02/10/2019, 3:17 PM  Flow Sheet     Pulmonary tests:  PFT 03/25/18 >> FEV1 2.93 (85%),FEV1% 81, TLC 5.84 (80%), DLCO 46% A1AT 03/25/18 >> 139, MM  Chest imaging:  HRCT chest 02/25/18 >> atherosclerosis, enlarged PA, centrilobular and paraseptal emphysema, patchy GGO, mild traction BTX, 3 mm nodule RML CT chest 02/04/19 >> 4.5 cm TAA, atherosclerosis, mild/mod centrilobular and paraseptal emphysema, 8 mm RML nodule, patchy GGO and subpleural reticulation with mild traction BTX with basilar predominance no change  Sleep tests:  PSG 12/26/02 >> AHI 25 Auto CPAP 01/10/19 to 02/08/19 >> used on 30 of 30  nights with average 4 hrs 46 min.  Average AHI 16.9 with median CPAP 14 and 95 th percentile CPAP 19 cm H2O  Cardiac tests:  Echo 06/06/16 >> EF 55 to 60%, grade 1 DD  Medications:   Allergies as of 02/10/2019      Reactions   Penicillins    Childhood reaction-details unknown   Procaine Hcl Hives, Other (See Comments)   Red in the face and neck.  First generation in 1950s, but after that, I have been able to tolerate novacaine and lidocaine.   Pseudoephedrine Other (See Comments)   Patient went into afib   Sulfonamide Derivatives    Childhood reaction    Cardizem [diltiazem] Rash   Pseudoephedrine Hcl Palpitations        Medication List       Accurate as of February 10, 2019  3:17 PM. Always use your most recent med list.        acetaminophen 325 MG tablet Commonly known as:  TYLENOL Take 650 mg by mouth every 6 (six) hours as needed for mild pain.   albuterol 108 (90 Base) MCG/ACT inhaler Commonly known as:  PROVENTIL HFA;VENTOLIN HFA Inhale 2 puffs into the lungs every 6 (six) hours as needed for wheezing or shortness of breath.   ALPHAGAN P 0.1 % Soln Generic drug:  brimonidine INSTILL 1 DROP INTO RIGHT EYE 3 TIMES A DAY   atorvastatin 80 MG tablet Commonly known as:  LIPITOR TAKE 1 TABLET BY MOUTH  DAILY   BELSOMRA 20 MG Tabs Generic drug:  Suvorexant TAKE 1 TABLET BY MOUTH AT  BEDTIME   BREO ELLIPTA 200-25 MCG/INH Aepb Generic drug:  fluticasone furoate-vilanterol Inhale 1 puff into the lungs daily.   diphenhydrAMINE 25 MG tablet Commonly known as:  BENADRYL Take 25 mg by mouth at bedtime as needed for sleep.   dofetilide 500 MCG capsule Commonly known as:  TIKOSYN TAKE 1 CAPSULE BY MOUTH  TWICE DAILY   famotidine 10 MG tablet Commonly known as:  PEPCID Take 10 mg by mouth as needed for heartburn.   fish oil-omega-3 fatty acids 1000 MG capsule Take 1 g by mouth 2 (two) times daily.   fluticasone 50 MCG/ACT nasal spray Commonly known as:  FLONASE Place 2 sprays into both nostrils daily.   KLOR-CON M20 20 MEQ tablet Generic drug:  potassium chloride SA TAKE 1 TABLET BY MOUTH EVERY DAY   Magnesium Oxide 500 MG Tabs Take 750 mg by mouth daily.   metFORMIN 500 MG 24 hr tablet Commonly known as:  GLUCOPHAGE-XR TAKE 1 TABLETS BY MOUTH 3  TIMES DAILY.   MULTIPLE VITAMIN PO Take 1 tablet by mouth every evening.   nitroGLYCERIN 0.4 MG SL tablet Commonly known as:  NITROSTAT Place 1 tablet (0.4 mg total) under the tongue every 5 (five) minutes as needed for chest pain.   ondansetron 4 MG disintegrating tablet Commonly known as:  ZOFRAN ODT Take 1 tablet (4 mg total)  by mouth every 8 (eight) hours as needed for nausea or vomiting.   ONETOUCH VERIO test strip Generic drug:  glucose blood USE TWICE A DAY   PRADAXA 150 MG Caps capsule Generic drug:  dabigatran TAKE 1 CAPSULE BY MOUTH  EVERY 12 HOURS   tamsulosin 0.4 MG Caps capsule Commonly known as:  FLOMAX TAKE 1 CAPSULE BY MOUTH EVERY DAY       Past Surgical History:  He  has a past surgical history that includes cyst on epiglottis (08/2002); surgery  l4-l5  (1998); stent; ulnar neuropathy; Umbilical hernia repair; Lumbar disc surgery; Esophagogastroduodenoscopy (11/15/2011); Colonoscopy (11/15/2011); laparoscopic appendectomy (N/A, 06/06/2014); Colon surgery; Appendectomy (N/A, 06/06/2014); and Colonoscopy with propofol (N/A, 01/03/2017).  Family History:  His family history includes Breast cancer in his mother; Cancer in his father; Lung cancer in his father; Ovarian cancer in his mother; Thyroid disease in his mother.  Social History:  He  reports that he quit smoking about 10 years ago. His smoking use included cigarettes. He has a 84.00 pack-year smoking history. He has never used smokeless tobacco. He reports that he does not drink alcohol or use drugs.

## 2019-02-10 NOTE — Patient Instructions (Signed)
Will schedule CT chest for February 2021 and follow up after that

## 2019-02-12 ENCOUNTER — Encounter: Payer: Self-pay | Admitting: Internal Medicine

## 2019-02-12 ENCOUNTER — Ambulatory Visit (INDEPENDENT_AMBULATORY_CARE_PROVIDER_SITE_OTHER): Payer: Medicare Other | Admitting: Internal Medicine

## 2019-02-12 VITALS — BP 130/66 | HR 64 | Temp 98.3°F | Wt 285.8 lb

## 2019-02-12 DIAGNOSIS — G4733 Obstructive sleep apnea (adult) (pediatric): Secondary | ICD-10-CM | POA: Diagnosis not present

## 2019-02-12 DIAGNOSIS — Z79899 Other long term (current) drug therapy: Secondary | ICD-10-CM | POA: Diagnosis not present

## 2019-02-12 DIAGNOSIS — Z7901 Long term (current) use of anticoagulants: Secondary | ICD-10-CM | POA: Diagnosis not present

## 2019-02-12 DIAGNOSIS — I712 Thoracic aortic aneurysm, without rupture, unspecified: Secondary | ICD-10-CM

## 2019-02-12 DIAGNOSIS — E1159 Type 2 diabetes mellitus with other circulatory complications: Secondary | ICD-10-CM

## 2019-02-12 DIAGNOSIS — G479 Sleep disorder, unspecified: Secondary | ICD-10-CM

## 2019-02-12 DIAGNOSIS — L309 Dermatitis, unspecified: Secondary | ICD-10-CM

## 2019-02-12 MED ORDER — TRIAMCINOLONE ACETONIDE 0.1 % EX CREA: 1.0000 "application " | TOPICAL_CREAM | Freq: Two times a day (BID) | CUTANEOUS | 0 refills | Status: DC

## 2019-02-12 NOTE — Patient Instructions (Addendum)
Glad you are doing well.     Wt Readings from Last 3 Encounters:  02/12/19 285 lb 12.8 oz (129.6 kg)  02/10/19 284 lb 9.6 oz (129.1 kg)  02/01/19 285 lb (129.3 kg)   Lab Results  Component Value Date   WBC 7.8 02/08/2019   HGB 15.6 02/08/2019   HCT 46.8 02/08/2019   PLT 154.0 02/08/2019   GLUCOSE 99 02/08/2019   CHOL 85 02/08/2019   TRIG 110.0 02/08/2019   HDL 30.00 (L) 02/08/2019   LDLCALC 33 02/08/2019   ALT 48 02/08/2019   AST 30 02/08/2019   NA 141 02/08/2019   K 4.5 02/08/2019   CL 103 02/08/2019   CREATININE 1.05 02/08/2019   BUN 20 02/08/2019   CO2 31 02/08/2019   TSH 1.91 06/25/2018   PSA 0.93 06/25/2018   INR 1.31 12/16/2016   HGBA1C 6.7 (H) 02/08/2019   MICROALBUR 18.7 (H) 02/08/2019

## 2019-02-12 NOTE — Progress Notes (Signed)
Chief Complaint  Patient presents with  . Follow-up    pt is here to follow up on lab results     HPI: [name] [name]. 68 y.o. come in for Chronic disease management    Walks 2-3 miles .  Goingw friends.  To gym silver sneakers and feels well withy this   O2    Fitness.   pulm heart satus stable   Concern about incidental  taa 4.4 cm  Follow by ct scan at this time   Passes  Stone left   On own   flonas and zofran  clobetosol o.5 %  Per derm inear  Area   Once a week.   ROS: See pertinent positives and negatives per HPI.at most to itchy flaky ears and skin on leg per derm years ago  Can he have a refill?  Past Medical History:  Diagnosis Date  . Atrial fibrillation ([place]) 03/22/2009   a. s/p multiple DCCV; b. no coumadin due to low TE risk profile; c. Tikosyn Rx  . Coronary atherosclerosis of native coronary artery 11/2002   a. s/p stent to LAD 12/03; OM2 occluded at cath 12/03; d. myoview 5/10: no ischemia;  e. echo 7/11: EF 55%, BAE, mild RVE, PASP 41-45; Myoview was in March 2013. There was no ischemia or infarction, EF 51%   . Cutaneous abscess of back excluding buttocks 07/04/2014   Appears to stem from possibly a cyst very large area 6 cm contact surgeon office   . Diabetes mellitus without complication ([place])   . Drusen body    see opth note  . ERECTILE DYSFUNCTION 03/22/2009  . GERD 03/22/2009  . HYPERGLYCEMIA 04/25/2010  . HYPERLIPIDEMIA 03/22/2009  . 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 LATERAL EPICONDYLITIS, LEFT 10/24/2009  . LIVER FUNCTION TESTS, ABNORMAL 04/25/2010  . Local reaction to immunization 05/05/2012   minor resolving  zostavax   . Myocardial infarction ([place]) mi2003  . Numbness in left leg    foot related to back disease and surgery  . Obesity, unspecified 04/24/2009  . Perforated appendicitis with necrosis s/p open appendectomy 06/07/14 06/04/2014  . Renal cyst    Characterized by MRI as simple  . Ruptured suppurative  appendicitis    2015   . SLEEP APNEA, OBSTRUCTIVE 03/22/2009   compliant with CPAP  . THROMBOCYTOPENIA 08/16/2010  . TOBACCO USE, QUIT 10/24/2009  . 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 Umbilical hernia     Family History  Problem Relation Age of Onset  . Thyroid disease Mother   . Ovarian cancer Mother   . Breast cancer Mother   . Lung cancer Father   . Cancer Father     Social History   Socioeconomic History  . Marital status: Married    Spouse name: Not on file  . Number of children: Not on file  . Years of education: Not on file  . Highest education level: Not on file  Occupational History  . Not on file  Social Needs  . Financial resource strain: Not on file  . Food insecurity:    Worry: Not on file    Inability: Not on file  . Transportation needs:    Medical: Not on file    Non-medical: Not on file  Tobacco Use  . Smoking status: Former Smoker    Packs/day: 2.00    Years: 42.00    Pack years: 84.00    Types: Cigarettes  Last attempt to quit: 12/30/2008    Years since quitting: 10.1  . Smokeless tobacco: Never Used  . Tobacco comment: started at age 10; 1-2 ppd; quit 2010  Substance and Sexual Activity  . Alcohol use: No    Alcohol/week: 0.0 standard drinks    Comment: rarely; maybe 1 beer a year  . Drug use: No  . Sexual activity: Yes  Lifestyle  . Physical activity:    Days per week: Not on file    Minutes per session: Not on file  . Stress: Not on file  Relationships  . Social connections:    Talks on phone: Not on file    Gets together: Not on file    Attends religious service: Not on file    Active member of club or organization: Not on file    Attends meetings of clubs or organizations: Not on file    Relationship status: Not on file  Other Topics Concern  . Not on file  Social History Narrative   Retired paramedic   Regular exercise-yes not recently    Has children   Wife is overweight and has fibromyalgia and depression on disability  doesn't go out much. Back surgery    Has older dog   Retired from [employer] years of service now 7 years    Caffeine- minimal    Outpatient Medications Prior to Visit  Medication Sig Dispense Refill  . acetaminophen (TYLENOL) 325 MG tablet Take 650 mg by mouth every 6 (six) hours as needed for mild pain.    [name] albuterol (PROVENTIL HFA;VENTOLIN HFA) 108 (90 Base) MCG/ACT inhaler Inhale 2 puffs into the lungs every 6 (six) hours as needed for wheezing or shortness of breath. 1 Inhaler 0  . ALPHAGAN P 0.1 % SOLN INSTILL 1 DROP INTO RIGHT EYE 3 TIMES A DAY  3  . atorvastatin (LIPITOR) 80 MG tablet TAKE 1 TABLET BY MOUTH  DAILY 90 tablet 1  . BELSOMRA 20 MG TABS TAKE 1 TABLET BY MOUTH AT  BEDTIME 90 tablet 0  . diphenhydrAMINE (BENADRYL) 25 MG tablet Take 25 mg by mouth at bedtime as needed for sleep.    [name] dofetilide (TIKOSYN) 500 MCG capsule TAKE 1 CAPSULE BY MOUTH  TWICE DAILY 180 capsule 1  . famotidine (PEPCID) 10 MG tablet Take 10 mg by mouth as needed for heartburn.     . fish oil-omega-3 fatty acids 1000 MG capsule Take 1 g by mouth 2 (two) times daily.     . fluticasone (FLONASE) 50 MCG/ACT nasal spray Place 2 sprays into both nostrils daily. (Patient taking differently: Place 2 sprays into both nostrils daily as needed for allergies. ) 16 g 6  . fluticasone furoate-vilanterol (BREO ELLIPTA) 200-25 MCG/INH AEPB Inhale 1 puff into the lungs daily.    [name] KLOR-CON M20 20 MEQ tablet TAKE 1 TABLET BY MOUTH EVERY DAY 90 tablet 3  . Magnesium Oxide 500 MG TABS Take 750 mg by mouth daily.    . metFORMIN (GLUCOPHAGE-XR) 500 MG 24 hr tablet TAKE 1 TABLETS BY MOUTH 3  TIMES DAILY. 270 tablet 0  . MULTIPLE VITAMIN PO Take 1 tablet by mouth every evening.     . nitroGLYCERIN (NITROSTAT) 0.4 MG SL tablet Place 1 tablet (0.4 mg total) under the tongue every 5 (five) minutes as needed for chest pain. 25 tablet 12  . ondansetron (ZOFRAN ODT) 4 MG disintegrating tablet Take 1 tablet (4 mg total) by mouth every  8 (eight) hours  as needed for nausea or vomiting. 24 tablet 0  . ONETOUCH VERIO test strip USE TWICE A DAY 50 each 12  . PRADAXA 150 MG CAPS capsule TAKE 1 CAPSULE BY MOUTH  EVERY 12 HOURS 180 capsule 1  . tamsulosin (FLOMAX) 0.4 MG CAPS capsule TAKE 1 CAPSULE BY MOUTH EVERY DAY 30 capsule 5   Facility-Administered Medications Prior to Visit  Medication Dose Route Frequency Provider Last Rate Last Dose  . ipratropium-albuterol (DUONEB) 0.5-2.5 (3) MG/3ML nebulizer solution 3 mL  3 mL Nebulization Q6H Rusty Villella, Standley Brooking, MD   3 mL at 01/02/18 1545     EXAM:  BP 130/66 (BP Location: Right Arm, Patient Position: Sitting, Cuff Size: Large)   Pulse 64   Temp 98.3 F (36.8 C) (Oral)   Wt 285 lb 12.8 oz (129.6 kg)   BMI 37.71 kg/m   Body mass index is 37.71 kg/m.  GENERAL: vitals reviewed and listed above, alert, oriented, appears well hydrated and in no acute distress HEENT: atraumatic, conjunctiva  clear, no obvious abnormalities on inspection of external nose and ears  Min flake outer ear  OP : no lesion edema or exudate  NECK: no obvious masses on inspection palpation  LUNGS: clear to auscultation bilaterally, no wheezes, rales or rhonchi, good air movement CV: HRRR, no clubbing cyanosis or  peripheral edema nl cap refill  Abdomen:  Sof,t normal bowel sounds without hepatosplenomegaly, no guarding rebound or masses no CVA tenderness  Skin senil eccymosis righ forearm ( from itching scratching) no lesion  MS: moves all extremities without noticeable focal  abnormality PSYCH: pleasant and cooperative, no obvious depression or anxiety Lab Results  Component Value Date   WBC 7.8 02/08/2019   HGB 15.6 02/08/2019   HCT 46.8 02/08/2019   PLT 154.0 02/08/2019   GLUCOSE 99 02/08/2019   CHOL 85 02/08/2019   TRIG 110.0 02/08/2019   HDL 30.00 (L) 02/08/2019   LDLCALC 33 02/08/2019   ALT 48 02/08/2019   AST 30 02/08/2019   NA 141 02/08/2019   K 4.5 02/08/2019   CL 103 02/08/2019    CREATININE 1.05 02/08/2019   BUN 20 02/08/2019   CO2 31 02/08/2019   TSH 1.91 06/25/2018   PSA 0.93 06/25/2018   INR 1.31 12/16/2016   HGBA1C 6.7 (H) 02/08/2019   MICROALBUR 18.7 (H) 02/08/2019   BP Readings from Last 3 Encounters:  02/12/19 130/66  02/10/19 132/68  02/01/19 126/70   Labs reviewed  ASSESSMENT AND PLAN:  Discussed the following assessment and plan:  Type 2 diabetes mellitus with other circulatory complications (HCC)  Medication management  Obstructive sleep apnea  Chronic anticoagulation  Morbid obesity (HCC) - improved   to continue  Sleep disorder - cont belsomra  Thoracic aortic aneurysm without rupture ([place]) - incidental finding on ct   Dermatitis - ocass around ear etc trial lowerpot steroid if needed Continue lifestyle intervention healthy eating and exercise .weight management   Counseled.  Trial tmc instead of clobetasol for skin issues if needed    -Patient advised to return or notify health care team  if  new concerns arise.  Patient Instructions   Glad you are doing well.     Wt Readings from Last 3 Encounters:  02/12/19 285 lb 12.8 oz (129.6 kg)  02/10/19 284 lb 9.6 oz (129.1 kg)  02/01/19 285 lb (129.3 kg)   Lab Results  Component Value Date   WBC 7.8 02/08/2019   HGB 15.6 02/08/2019   HCT 46.8  02/08/2019   PLT 154.0 02/08/2019   GLUCOSE 99 02/08/2019   CHOL 85 02/08/2019   TRIG 110.0 02/08/2019   HDL 30.00 (L) 02/08/2019   LDLCALC 33 02/08/2019   ALT 48 02/08/2019   AST 30 02/08/2019   NA 141 02/08/2019   K 4.5 02/08/2019   CL 103 02/08/2019   CREATININE 1.05 02/08/2019   BUN 20 02/08/2019   CO2 31 02/08/2019   TSH 1.91 06/25/2018   PSA 0.93 06/25/2018   INR 1.31 12/16/2016   HGBA1C 6.7 (H) 02/08/2019   MICROALBUR 18.7 (H) 02/08/2019        Marsela Kuan K. Tobi Groesbeck M.D.

## 2019-02-18 ENCOUNTER — Ambulatory Visit (INDEPENDENT_AMBULATORY_CARE_PROVIDER_SITE_OTHER): Payer: Medicare Other | Admitting: Adult Health

## 2019-02-18 ENCOUNTER — Encounter: Payer: Self-pay | Admitting: Adult Health

## 2019-02-18 ENCOUNTER — Ambulatory Visit: Payer: Self-pay

## 2019-02-18 VITALS — BP 124/70 | Temp 97.8°F | Wt 287.0 lb

## 2019-02-18 DIAGNOSIS — L03113 Cellulitis of right upper limb: Secondary | ICD-10-CM

## 2019-02-18 MED ORDER — DOXYCYCLINE HYCLATE 100 MG PO CAPS: 100.0000 mg | ORAL_CAPSULE | Freq: Two times a day (BID) | ORAL | 0 refills | Status: DC

## 2019-02-18 NOTE — Telephone Encounter (Signed)
Pt c/o redness and swelling to the right forearm that was noted on Saturday. Pt stated the redness and swelling is 2" by 2". Pt c/o mild to moderate pain to the affected area. Pt stated he thinks the redness and swelling came from after he was scratching his arm.  Pt denies fever, chest pain or difficulty breathing. Appt needed today. Dr. Regis Bill has no openings today. Pt asked to see Dorothyann Peng NP. Appt made for 3:45 pm today. Reason for Disposition . [1] Red area or streak [2] large (> 2 in. or 5 cm)  Answer Assessment - Initial Assessment Questions 1. ONSET: "When did the swelling start?" (e.g., minutes, hours, days)     Saturday night 2. LOCATION: "What part of the arm is swollen?"  "Are both arms swollen or just one arm?"     Right forearm 2 inces by 2 inches 3. SEVERITY: "How bad is the swelling?" (e.g., localized; mild, moderate, severe)   - LOCALIZED: Small area of puffiness or swelling on just one arm   - JOINT SWELLING: Swelling of one joint   - MILD: Puffiness or swelling of hand   - MODERATE: Puffiness or swollen feeling of entire arm    - SEVERE: All of arm looks swollen; pitting edema     localized 4. REDNESS: "Does the swelling look red or infected?"     yes 5. PAIN: "Is the swelling painful to touch?" If so, ask: "How painful is it?"   (Scale 1-10; mild, moderate or severe)     Yes- mild to moderate 6. FEVER: "Do you have a fever?" If so, ask: "What is it, how was it measured, and when did it start?"      no 7. CAUSE: "What do you think is causing the arm swelling?"     celulitis 8. MEDICAL HISTORY: "Do you have a history of heart failure, kidney disease, liver failure, or cancer?"     MI with stent 20 yrs ago, atrial fibrillation 9. RECURRENT SYMPTOM: "Have you had arm swelling before?" If so, ask: "When was the last time?" "What happened that time?"     No  10. OTHER SYMPTOMS: "Do you have any other symptoms?" (e.g., chest pain, difficulty breathing)       no 11.  PREGNANCY: "Is there any chance you are pregnant?" "When was your last menstrual period?"       n/a  Protocols used: ARM SWELLING AND EDEMA-A-AH

## 2019-02-18 NOTE — Progress Notes (Signed)
Subjective:    Patient ID: [name] Chimes., male    DOB: [birth year], 68 y.o.   MRN: [id number]  HPI  68 year old male who  has a past medical history of Atrial fibrillation ([place]) (03/22/2009), Coronary atherosclerosis of native coronary artery (11/2002), Cutaneous abscess of back excluding buttocks (07/04/2014), Diabetes mellitus without complication ([place]), Drusen body, ERECTILE DYSFUNCTION (03/22/2009), GERD (03/22/2009), HYPERGLYCEMIA (04/25/2010), HYPERLIPIDEMIA (03/22/2009), Iliac aneurysm ([place]), LATERAL EPICONDYLITIS, LEFT (10/24/2009), LIVER FUNCTION TESTS, ABNORMAL (04/25/2010), Local reaction to immunization (05/05/2012), Myocardial infarction ([place]) (mi2003), Numbness in left leg, Obesity, unspecified (04/24/2009), Perforated appendicitis with necrosis s/p open appendectomy 06/07/14 (06/04/2014), Renal cyst, Ruptured suppurative appendicitis, SLEEP APNEA, OBSTRUCTIVE (03/22/2009), THROMBOCYTOPENIA (08/16/2010), TOBACCO USE, QUIT (10/24/2009), ULNAR NEUROPATHY, LEFT (5/32/9924), and Umbilical hernia.  Presents for one week of redness, warmth, and swelling to right forearm. Reports breaking the skin earlier in the week while itching his skin.   History of cellulitis   Review of Systems See HPI   Past Medical History:  Diagnosis Date  . Atrial fibrillation ([place]) 03/22/2009   a. s/p multiple DCCV; b. no coumadin due to low TE risk profile; c. Tikosyn Rx  . Coronary atherosclerosis of native coronary artery 11/2002   a. s/p stent to LAD 12/03; OM2 occluded at cath 12/03; d. myoview 5/10: no ischemia;  e. echo 7/11: EF 55%, BAE, mild RVE, PASP 41-45; Myoview was in March 2013. There was no ischemia or infarction, EF 51%   . Cutaneous abscess of back excluding buttocks 07/04/2014   Appears to stem from possibly a cyst very large area 6 cm contact surgeon office   . Diabetes mellitus without complication ([place])   . Drusen body    see opth note  . ERECTILE DYSFUNCTION 03/22/2009  . GERD 03/22/2009  .  HYPERGLYCEMIA 04/25/2010  . HYPERLIPIDEMIA 03/22/2009  . 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 LATERAL EPICONDYLITIS, LEFT 10/24/2009  . LIVER FUNCTION TESTS, ABNORMAL 04/25/2010  . Local reaction to immunization 05/05/2012   minor resolving  zostavax   . Myocardial infarction ([place]) mi2003  . Numbness in left leg    foot related to back disease and surgery  . Obesity, unspecified 04/24/2009  . Perforated appendicitis with necrosis s/p open appendectomy 06/07/14 06/04/2014  . Renal cyst    Characterized by MRI as simple  . Ruptured suppurative appendicitis    2015   . SLEEP APNEA, OBSTRUCTIVE 03/22/2009   compliant with CPAP  . THROMBOCYTOPENIA 08/16/2010  . TOBACCO USE, QUIT 10/24/2009  . 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 Umbilical hernia     Social History   Socioeconomic History  . Marital status: Married    Spouse name: Not on file  . Number of children: Not on file  . Years of education: Not on file  . Highest education level: Not on file  Occupational History  . Not on file  Social Needs  . Financial resource strain: Not on file  . Food insecurity:    Worry: Not on file    Inability: Not on file  . Transportation needs:    Medical: Not on file    Non-medical: Not on file  Tobacco Use  . Smoking status: Former Smoker    Packs/day: 2.00    Years: 42.00    Pack years: 84.00    Types: Cigarettes    Last attempt to quit: 12/30/2008    Years since quitting: 10.1  . Smokeless tobacco:  Never Used  . Tobacco comment: started at age 64; 1-2 ppd; quit 2010  Substance and Sexual Activity  . Alcohol use: No    Alcohol/week: 0.0 standard drinks    Comment: rarely; maybe 1 beer a year  . Drug use: No  . Sexual activity: Yes  Lifestyle  . Physical activity:    Days per week: Not on file    Minutes per session: Not on file  . Stress: Not on file  Relationships  . Social connections:    Talks on phone: Not on file    Gets together: Not on  file    Attends religious service: Not on file    Active member of club or organization: Not on file    Attends meetings of clubs or organizations: Not on file    Relationship status: Not on file  . Intimate partner violence:    Fear of current or ex partner: Not on file    Emotionally abused: Not on file    Physically abused: Not on file    Forced sexual activity: Not on file  Other Topics Concern  . Not on file  Social History Narrative   Retired paramedic   Regular exercise-yes not recently    Has children   Wife is overweight and has fibromyalgia and depression on disability doesn't go out much. Back surgery    Has older dog   Retired from [employer] 31 years of service now 7 years    Caffeine- minimal    Past Surgical History:  Procedure Laterality Date  . APPENDECTOMY N/A 06/06/2014   Procedure: APPENDECTOMY;  Surgeon: Odis Hollingshead, MD;  Location: [facility];  Service: General;  Laterality: N/A;  . COLON SURGERY    . COLONOSCOPY  11/15/2011   Procedure: COLONOSCOPY;  Surgeon: Juanita Craver, MD;  Location: [facility];  Service: Endoscopy;  Laterality: N/A;  . COLONOSCOPY WITH PROPOFOL N/A 01/03/2017   Procedure: COLONOSCOPY WITH PROPOFOL;  Surgeon: Carol Ada, MD;  Location: [facility];  Service: Endoscopy;  Laterality: N/A;  . cyst on epiglottis  08/2002  . ESOPHAGOGASTRODUODENOSCOPY  11/15/2011   Procedure: ESOPHAGOGASTRODUODENOSCOPY (EGD);  Surgeon: Juanita Craver, MD;  Location: [facility];  Service: Endoscopy;  Laterality: N/A;  . LAPAROSCOPIC APPENDECTOMY N/A 06/06/2014   Procedure: APPENDECTOMY LAPAROSCOPIC attemted;  Surgeon: Odis Hollingshead, MD;  Location: [facility];  Service: General;  Laterality: N/A;  . LUMBAR DISC SURGERY     two  holes in spinalcovering  . stent     LAD DUS 2004  . surgery l4-l5   1998   ruptured x 3  . ulnar neuropathy    . UMBILICAL HERNIA REPAIR     mesh    Family History  Problem Relation Age of Onset  . Thyroid disease Mother   .  Ovarian cancer Mother   . Breast cancer Mother   . Lung cancer Father   . Cancer Father     Allergies  Allergen Reactions  . Penicillins     Childhood reaction-details unknown  . Procaine Hcl Hives and Other (See Comments)    Red in the face and neck.  First generation in 1950s, but after that, I have been able to tolerate novacaine and lidocaine.  . Pseudoephedrine Other (See Comments)    Patient went into afib  . Sulfonamide Derivatives     Childhood reaction   . Cardizem [Diltiazem] Rash  . Pseudoephedrine Hcl Palpitations    Current Outpatient Medications on File Prior to Visit  Medication Sig Dispense Refill  . acetaminophen (TYLENOL) 325 MG tablet Take 650 mg by mouth every 6 (six) hours as needed for mild pain.    [name] albuterol (PROVENTIL HFA;VENTOLIN HFA) 108 (90 Base) MCG/ACT inhaler Inhale 2 puffs into the lungs every 6 (six) hours as needed for wheezing or shortness of breath. 1 Inhaler 0  . ALPHAGAN P 0.1 % SOLN INSTILL 1 DROP INTO RIGHT EYE 3 TIMES A DAY  3  . atorvastatin (LIPITOR) 80 MG tablet TAKE 1 TABLET BY MOUTH  DAILY 90 tablet 1  . BELSOMRA 20 MG TABS TAKE 1 TABLET BY MOUTH AT  BEDTIME 90 tablet 0  . diphenhydrAMINE (BENADRYL) 25 MG tablet Take 25 mg by mouth at bedtime as needed for sleep.    [name] dofetilide (TIKOSYN) 500 MCG capsule TAKE 1 CAPSULE BY MOUTH  TWICE DAILY 180 capsule 1  . famotidine (PEPCID) 10 MG tablet Take 10 mg by mouth as needed for heartburn.     . fish oil-omega-3 fatty acids 1000 MG capsule Take 1 g by mouth 2 (two) times daily.     . fluticasone (FLONASE) 50 MCG/ACT nasal spray Place 2 sprays into both nostrils daily. (Patient taking differently: Place 2 sprays into both nostrils daily as needed for allergies. ) 16 g 6  . fluticasone furoate-vilanterol (BREO ELLIPTA) 200-25 MCG/INH AEPB Inhale 1 puff into the lungs daily.    [name] KLOR-CON M20 20 MEQ tablet TAKE 1 TABLET BY MOUTH EVERY DAY 90 tablet 3  . Magnesium Oxide 500 MG TABS Take 750 mg by  mouth daily.    . metFORMIN (GLUCOPHAGE-XR) 500 MG 24 hr tablet TAKE 1 TABLETS BY MOUTH 3  TIMES DAILY. 270 tablet 0  . MULTIPLE VITAMIN PO Take 1 tablet by mouth every evening.     . nitroGLYCERIN (NITROSTAT) 0.4 MG SL tablet Place 1 tablet (0.4 mg total) under the tongue every 5 (five) minutes as needed for chest pain. 25 tablet 12  . ondansetron (ZOFRAN ODT) 4 MG disintegrating tablet Take 1 tablet (4 mg total) by mouth every 8 (eight) hours as needed for nausea or vomiting. 24 tablet 0  . ONETOUCH VERIO test strip USE TWICE A DAY 50 each 12  . PRADAXA 150 MG CAPS capsule TAKE 1 CAPSULE BY MOUTH  EVERY 12 HOURS 180 capsule 1  . tamsulosin (FLOMAX) 0.4 MG CAPS capsule TAKE 1 CAPSULE BY MOUTH EVERY DAY 30 capsule 5  . triamcinolone cream (KENALOG) 0.1 % Apply 1 application topically 2 (two) times daily. As needed. 30 g 0   Current Facility-Administered Medications on File Prior to Visit  Medication Dose Route Frequency Provider Last Rate Last Dose  . ipratropium-albuterol (DUONEB) 0.5-2.5 (3) MG/3ML nebulizer solution 3 mL  3 mL Nebulization Q6H Panosh, Standley Brooking, MD   3 mL at 01/02/18 1545    BP 124/70   Temp 97.8 F (36.6 C)   Wt 287 lb (130.2 kg)   BMI 37.87 kg/m       Objective:   Physical Exam Vitals signs reviewed.  Constitutional:      Appearance: Normal appearance.  Skin:    General: Skin is warm and dry.     Capillary Refill: Capillary refill takes less than 2 seconds.     Findings: Erythema and rash present.     Comments: Softball sized area on right forearm. + warmness, tenderness with palpation, swelling and erythema.   Neurological:     General: No focal deficit present.  Mental Status: Mental status is at baseline. He is disoriented.       Assessment & Plan:  1. Cellulitis of right upper extremity - exam consistent with cellulitis. Will treat with doxycycline  - doxycycline (VIBRAMYCIN) 100 MG capsule; Take 1 capsule (100 mg total) by mouth 2 (two) times  daily.  Dispense: 20 capsule; Refill: 0  [employer]

## 2019-03-10 ENCOUNTER — Other Ambulatory Visit: Payer: Self-pay | Admitting: Internal Medicine

## 2019-03-18 ENCOUNTER — Other Ambulatory Visit: Payer: Self-pay | Admitting: Internal Medicine

## 2019-04-05 ENCOUNTER — Other Ambulatory Visit: Payer: Self-pay

## 2019-04-05 ENCOUNTER — Other Ambulatory Visit: Payer: Self-pay | Admitting: Internal Medicine

## 2019-04-05 MED ORDER — SUVOREXANT 20 MG PO TABS: 1.0000 | ORAL_TABLET | Freq: Every day | ORAL | 0 refills | Status: DC

## 2019-04-06 ENCOUNTER — Telehealth: Payer: Self-pay | Admitting: Internal Medicine

## 2019-04-06 NOTE — Telephone Encounter (Signed)
Copied from [place] [phone]. Topic: Quick Communication - See Telephone Encounter >> Apr 06, 2019  3:31 PM Vernona Rieger wrote: CRM for notification. See Telephone encounter for: 04/06/19.  Felicia with [facility] called to see if BELSOMRA 20 MG TABS has been mailed to them yet?  [phone] reference number [id number]

## 2019-04-24 ENCOUNTER — Other Ambulatory Visit: Payer: Self-pay

## 2019-04-27 ENCOUNTER — Other Ambulatory Visit: Payer: Self-pay

## 2019-04-27 MED ORDER — SUVOREXANT 20 MG PO TABS: 1.0000 | ORAL_TABLET | Freq: Every day | ORAL | 0 refills | Status: DC

## 2019-05-03 ENCOUNTER — Telehealth: Payer: Self-pay | Admitting: Internal Medicine

## 2019-05-03 NOTE — Telephone Encounter (Signed)
Copied from [place] [id number]. Topic: Quick Communication - Rx Refill/Question >> May 03, 2019 12:04 PM Nils Flack wrote: Medication: Suvorexant (BELSOMRA) 20 MG TABS Has the patient contacted their pharmacy? Yes.   (Agent: If no, request that the patient contact the pharmacy for the refill.) (Agent: If yes, when and what did the pharmacy advise?) Pt has been told by [facility] that they never received this rx.  Pt is not out of this med. It was approved on 04/27/19   Preferred Pharmacy (with phone number or street name): [facility]  Cb is [phone], please call back  Agent: Please be advised that RX refills may take up to 3 business days. We ask that you follow-up with your pharmacy.

## 2019-05-03 NOTE — Telephone Encounter (Signed)
This has been faxed.

## 2019-05-05 ENCOUNTER — Other Ambulatory Visit: Payer: Self-pay

## 2019-05-05 ENCOUNTER — Other Ambulatory Visit: Payer: Self-pay | Admitting: Internal Medicine

## 2019-05-05 MED ORDER — SUVOREXANT 20 MG PO TABS: 1.0000 | ORAL_TABLET | Freq: Every day | ORAL | 1 refills | Status: DC

## 2019-05-05 MED ORDER — METFORMIN HCL ER 500 MG PO TB24: ORAL_TABLET | ORAL | 0 refills | Status: DC

## 2019-05-05 MED ORDER — SUVOREXANT 20 MG PO TABS: 1.0000 | ORAL_TABLET | Freq: Every day | ORAL | 0 refills | Status: DC

## 2019-05-05 NOTE — Telephone Encounter (Signed)
Please advise pt is wanting refill

## 2019-05-05 NOTE — Telephone Encounter (Signed)
Other fills have not gone through including one filled today which was not meant to be approved

## 2019-05-05 NOTE — Telephone Encounter (Signed)
Sent in electronically .  

## 2019-06-03 ENCOUNTER — Other Ambulatory Visit: Payer: Self-pay | Admitting: Cardiology

## 2019-06-18 ENCOUNTER — Encounter: Payer: Medicare Other | Admitting: Internal Medicine

## 2019-07-03 ENCOUNTER — Other Ambulatory Visit: Payer: Self-pay | Admitting: Cardiology

## 2019-07-05 NOTE — Telephone Encounter (Signed)
Rx(s) sent to pharmacy electronically.  

## 2019-07-20 NOTE — Progress Notes (Signed)
Chief Complaint  Patient presents with  . Annual Exam    no concerns     HPI: [name] [name]. 68 y.o. comes in today for Preventive Medicare exam/ wellness visit . And Chronic disease management  Doing well  Sleep  Taking belsomra and osa  insurance cost ? Up depending on pharmacy   DM  In 117  Range 130 pp   Walking and continuing to lose weight  Needs metformin refill   Big toe  ocass numbness but no changes   Eye vision  Stable   Hearing the same   No bleeding  New cv sx  And  Episodes    Health Maintenance  Topic Date Due  . FOOT EXAM  06/26/2019  . OPHTHALMOLOGY EXAM  07/29/2019  . INFLUENZA VACCINE  07/31/2019  . HEMOGLOBIN A1C  08/09/2019  . URINE MICROALBUMIN  02/09/2020  . TETANUS/TDAP  04/05/2025  . COLONOSCOPY  01/03/2027  . Hepatitis C Screening  Completed  . PNA vac Low Risk Adult  Completed   Health Maintenance Review LIFESTYLE:  Exercise:  Walking 2 miles per day  Week  Days   Tobacco/ETS: no Alcohol:  One on vacation Sugar beverages:water    1 coffee  Sleep: 4-7  Drug use: no HH:  2       Hearing:  Dec harder with mask but no change   Vision:   A bit more hard to screen focus has glasses at present . Last eye check UTD  Safety:  Has smoke detector and wears seat belts.  No firearms. No excess sun exposure. Sees dentist regularly.  Falls: n.  Memory: Felt to be good  , no concern from her or her family.  Depression: No anhedonia unusual crying or depressive symptoms  Nutrition: Eats well balanced diet; adequate calcium and vitamin D. No swallowing chewing problems.  Injury: no major injuries in the last six months.  Other healthcare providers:  Reviewed toda  Preventive parameters: up-to-date  Reviewed  Due shingrix   ADLS:   There are no problems or need for assistance  driving, feeding, obtaining food, dressing, toileting and bathing, managing money using phone.  is independent. caretaking wife  Disables    ROS:  GEN/  HEENT: No fever, significant weight changes sweats headaches  hearing changes, CV/ PULM; No chest pain shortness of breath cough, syncope,edema  change in exercise tolerance. GI /GU: No adominal pain, vomiting, change in bowel habits. No blood in the stool. No significant GU symptoms. SKIN/HEME: ,no acute skin rashes suspicious lesions or bleeding. No lymphadenopathy, nodules, masses.  NEURO/ PSYCH:  No neurologic signs such as weakness numbness. No depression anxiety. IMM/ Allergy: No unusual infections.  Allergy .   REST of 12 system review negative except as per HPI   Past Medical History:  Diagnosis Date  . Atrial fibrillation ([place]) 03/22/2009   a. s/p multiple DCCV; b. no coumadin due to low TE risk profile; c. Tikosyn Rx  . Coronary atherosclerosis of native coronary artery 11/2002   a. s/p stent to LAD 12/03; OM2 occluded at cath 12/03; d. myoview 5/10: no ischemia;  e. echo 7/11: EF 55%, BAE, mild RVE, PASP 41-45; Myoview was in March 2013. There was no ischemia or infarction, EF 51%   . Cutaneous abscess of back excluding buttocks 07/04/2014   Appears to stem from possibly a cyst very large area 6 cm contact surgeon office   . Diabetes mellitus without complication ([place])   .  Drusen body    see opth note  . ERECTILE DYSFUNCTION 03/22/2009  . GERD 03/22/2009  . HYPERGLYCEMIA 04/25/2010  . HYPERLIPIDEMIA 03/22/2009  . 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 LATERAL EPICONDYLITIS, LEFT 10/24/2009  . LIVER FUNCTION TESTS, ABNORMAL 04/25/2010  . Local reaction to immunization 05/05/2012   minor resolving  zostavax   . Myocardial infarction ([place]) mi2003  . Numbness in left leg    foot related to back disease and surgery  . Obesity, unspecified 04/24/2009  . Perforated appendicitis with necrosis s/p open appendectomy 06/07/14 06/04/2014  . Renal cyst    Characterized by MRI as simple  . Ruptured suppurative appendicitis    2015   . SLEEP APNEA, OBSTRUCTIVE  03/22/2009   compliant with CPAP  . THROMBOCYTOPENIA 08/16/2010  . TOBACCO USE, QUIT 10/24/2009  . 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 Umbilical hernia     Family History  Problem Relation Age of Onset  . Thyroid disease Mother   . Ovarian cancer Mother   . Breast cancer Mother   . Lung cancer Father   . Cancer Father     Social History   Socioeconomic History  . Marital status: Married    Spouse name: Not on file  . Number of children: Not on file  . Years of education: Not on file  . Highest education level: Not on file  Occupational History  . Not on file  Social Needs  . Financial resource strain: Not on file  . Food insecurity    Worry: Not on file    Inability: Not on file  . Transportation needs    Medical: Not on file    Non-medical: Not on file  Tobacco Use  . Smoking status: Former Smoker    Packs/day: 2.00    Years: 42.00    Pack years: 84.00    Types: Cigarettes    Quit date: 12/30/2008    Years since quitting: 10.5  . Smokeless tobacco: Never Used  . Tobacco comment: started at age 47; 1-2 ppd; quit 2010  Substance and Sexual Activity  . Alcohol use: No    Alcohol/week: 0.0 standard drinks    Comment: rarely; maybe 1 beer a year  . Drug use: No  . Sexual activity: Yes  Lifestyle  . Physical activity    Days per week: Not on file    Minutes per session: Not on file  . Stress: Not on file  Relationships  . Social Herbalist on phone: Not on file    Gets together: Not on file    Attends religious service: Not on file    Active member of club or organization: Not on file    Attends meetings of clubs or organizations: Not on file    Relationship status: Not on file  Other Topics Concern  . Not on file  Social History Narrative   Retired paramedic   Regular exercise-yes not recently    Has children   Wife is overweight and has fibromyalgia and depression on disability doesn't go out much. Back surgery    Has older dog   Retired from  [employer] 31 years of service now 7 years    Caffeine- minimal    Outpatient Encounter Medications as of 07/21/2019  Medication Sig  . acetaminophen (TYLENOL) 325 MG tablet Take 650 mg by mouth every 6 (six) hours as needed for mild pain.  [name] albuterol (  PROVENTIL HFA;VENTOLIN HFA) 108 (90 Base) MCG/ACT inhaler Inhale 2 puffs into the lungs every 6 (six) hours as needed for wheezing or shortness of breath.  . ALPHAGAN P 0.1 % SOLN INSTILL 1 DROP INTO RIGHT EYE 3 TIMES A DAY  . atorvastatin (LIPITOR) 80 MG tablet TAKE 1 TABLET BY MOUTH  DAILY  . diphenhydrAMINE (BENADRYL) 25 MG tablet Take 25 mg by mouth at bedtime as needed for sleep.  [name] dofetilide (TIKOSYN) 500 MCG capsule Take 1 capsule (500 mcg total) by mouth 2 (two) times daily.  [name] doxycycline (VIBRAMYCIN) 100 MG capsule Take 1 capsule (100 mg total) by mouth 2 (two) times daily.  . famotidine (PEPCID) 10 MG tablet Take 10 mg by mouth as needed for heartburn.   . fexofenadine-pseudoephedrine (ALLEGRA-D) 60-120 MG 12 hr tablet Take 1 tablet by mouth 2 (two) times daily.  . fish oil-omega-3 fatty acids 1000 MG capsule Take 1 g by mouth 2 (two) times daily.   . fluticasone (FLONASE) 50 MCG/ACT nasal spray Place 2 sprays into both nostrils daily as needed for allergies.  . fluticasone furoate-vilanterol (BREO ELLIPTA) 200-25 MCG/INH AEPB Inhale 1 puff into the lungs daily.  [name] KLOR-CON M20 20 MEQ tablet TAKE 1 TABLET BY MOUTH EVERY DAY  . Magnesium Oxide 500 MG TABS Take 750 mg by mouth daily.  . metFORMIN (GLUCOPHAGE-XR) 500 MG 24 hr tablet Take as directed  . MULTIPLE VITAMIN PO Take 1 tablet by mouth every evening.   . nitroGLYCERIN (NITROSTAT) 0.4 MG SL tablet Place 1 tablet (0.4 mg total) under the tongue every 5 (five) minutes as needed for chest pain.  [name] ondansetron (ZOFRAN ODT) 4 MG disintegrating tablet Take 1 tablet (4 mg total) by mouth every 8 (eight) hours as needed for nausea or vomiting.  [name] VERIO test strip USE TWICE A DAY  .  PRADAXA 150 MG CAPS capsule TAKE 1 CAPSULE BY MOUTH  EVERY 12 HOURS  . Suvorexant (BELSOMRA) 20 MG TABS Take 1 tablet by mouth at bedtime.  . Suvorexant (BELSOMRA) 20 MG TABS Take 1 tablet by mouth at bedtime.  . tamsulosin (FLOMAX) 0.4 MG CAPS capsule TAKE 1 CAPSULE BY MOUTH EVERY DAY  . triamcinolone cream (KENALOG) 0.1 % Apply 1 application topically 2 (two) times daily. As needed.  . [DISCONTINUED] metFORMIN (GLUCOPHAGE-XR) 500 MG 24 hr tablet Take as directed   Facility-Administered Encounter Medications as of 07/21/2019  Medication  . ipratropium-albuterol (DUONEB) 0.5-2.5 (3) MG/3ML nebulizer solution 3 mL    EXAM:  BP 138/72 (BP Location: Right Arm, Patient Position: Sitting, Cuff Size: Large)   Pulse 65   Temp 98.2 F (36.8 C) (Oral)   Ht 6\' 1"  (1.854 m)   Wt 278 lb (126.1 kg)   SpO2 95%   BMI 36.68 kg/m   Body mass index is 36.68 kg/m.  Physical Exam: Vital signs reviewed [id number] is a well-developed well-nourished alert cooperative   who appears stated age in no acute distress.  HEENT: normocephalic atraumatic , Eyes: PERRL EOM's full, conjunctiva clear, Nares: paten,t no deformity discharge or tenderness., Ears: no deformity EAC's clear TMs with normal landmarks. Mouth: clear OP, no lesions, edema.  Moist mucous membranes. Dentition in adequate repair. NECK: supple without masses, thyromegaly or bruits. CHEST/PULM:  Clear to auscultation and percussion breath sounds equal no wheeze , rales or rhonchi. No chest wall deformities or tenderness. CV: PMI is nondisplaced, S1 S2 no gallops, murmurs, rubs. Peripheral pulses are full without delay.No JVD .  ABDOMEN: Bowel sounds normal nontender  No guard or rebound, no hepato splenomegal no CVA tenderness.   Extremtities:  No clubbing cyanosis or edema, no acute joint swelling or redness no focal atrophy some varicos veins  NEURO:  Oriented x3, cranial nerves 3-12 appear to be intact, no obvious focal weakness,gait within  normal limits no abnormal reflexes or asymmetrical SKIN: No acute rashes normal turgor, color, no bruising or petechiae. PSYCH: Oriented, good eye contact, no obvious depression anxiety, cognition and judgment appear normal. LN: no cervical axillary inguinal adenopathy No noted deficits in memory, attention, and speech. Diabetic Foot Exam - Simple   Simple Foot Form Diabetic Foot exam was performed with the following findings: Yes 07/21/2019  1:20 PM  Visual Inspection No deformities, no ulcerations, no other skin breakdown bilaterally: Yes Sensation Testing Intact to touch and monofilament testing bilaterally: Yes See comments: Yes Pulse Check Posterior Tibialis and Dorsalis pulse intact bilaterally: Yes Comments Dec medial great toe       Lab Results  Component Value Date   WBC 7.8 02/08/2019   HGB 15.6 02/08/2019   HCT 46.8 02/08/2019   PLT 154.0 02/08/2019   GLUCOSE 99 02/08/2019   CHOL 85 02/08/2019   TRIG 110.0 02/08/2019   HDL 30.00 (L) 02/08/2019   LDLCALC 33 02/08/2019   ALT 48 02/08/2019   AST 30 02/08/2019   NA 141 02/08/2019   K 4.5 02/08/2019   CL 103 02/08/2019   CREATININE 1.05 02/08/2019   BUN 20 02/08/2019   CO2 31 02/08/2019   TSH 1.91 06/25/2018   PSA 0.93 06/25/2018   INR 1.31 12/16/2016   HGBA1C 6.7 (H) 02/08/2019   MICROALBUR 18.7 (H) 02/08/2019   Wt Readings from Last 3 Encounters:  07/21/19 278 lb (126.1 kg)  02/18/19 287 lb (130.2 kg)  02/12/19 285 lb 12.8 oz (129.6 kg)    ASSESSMENT AND PLAN:  Discussed the following assessment and plan:   ICD-10-CM   1. Visit for preventive health examination  Z00.00   2. Medication management  Z79.899 Hemoglobin A1c    CBC with Differential/Platelet  3. Pure hypercholesterolemia  E78.00   4. Chronic anticoagulation  Z79.01   5. Type 2 diabetes mellitus with other circulatory complications (HCC)  G29.52 Hemoglobin A1c    CBC with Differential/Platelet    Basic metabolic panel  6. Sleep disorder   G47.9   7. Obstructive sleep apnea  G47.33   8. Morbid obesity ([place])  E66.01    continues to lose weight  lsi continue  9. Diabetes mellitus without complication (HCC)  W41.3   10. Screening PSA (prostate specific antigen)  Z12.5 PSA  11. Thrombocytopenia ([facility]CC)  D69.6 CBC with Differential/Platelet    Doing well  Continuing lsi and rov 6 mos   Lab today  Patient Care Team: Chosen Geske, Standley Brooking, MD as PCP - General Juanita Craver, MD (Gastroenterology) Stanford Breed Denice Bors, MD (Cardiology) Jackolyn Confer, MD as Consulting Physician (General Surgery) Georgeann Oppenheim, MD as Consulting Physician (Ophthalmology) Chesley Mires, MD as Consulting Physician (Pulmonary Disease) Angelia Mould, MD as Consulting Physician (Vascular Surgery)  Patient Instructions  Glad you are doing well.    Continue  Healthy eating and weight loss .    Will notify you  of labs when available.   Plan  6 mos ROV and lab as appropriate  if needed.     Health Maintenance After Age 78 After age 36, you are at a higher risk for certain long-term diseases and  infections as well as injuries from falls. Falls are a major cause of broken bones and head injuries in people who are older than age 30. Getting regular preventive care can help to keep you healthy and well. Preventive care includes getting regular testing and making lifestyle changes as recommended by your health care provider. Talk with your health care provider about:  Which screenings and tests you should have. A screening is a test that checks for a disease when you have no symptoms.  A diet and exercise plan that is right for you. What should I know about screenings and tests to prevent falls? Screening and testing are the best ways to find a health problem early. Early diagnosis and treatment give you the best chance of managing medical conditions that are common after age 69. Certain conditions and lifestyle choices may make you more likely to have a  fall. Your health care provider may recommend:  Regular vision checks. Poor vision and conditions such as cataracts can make you more likely to have a fall. If you wear glasses, make sure to get your prescription updated if your vision changes.  Medicine review. Work with your health care provider to regularly review all of the medicines you are taking, including over-the-counter medicines. Ask your health care provider about any side effects that may make you more likely to have a fall. Tell your health care provider if any medicines that you take make you feel dizzy or sleepy.  Osteoporosis screening. Osteoporosis is a condition that causes the bones to get weaker. This can make the bones weak and cause them to break more easily.  Blood pressure screening. Blood pressure changes and medicines to control blood pressure can make you feel dizzy.  Strength and balance checks. Your health care provider may recommend certain tests to check your strength and balance while standing, walking, or changing positions.  Foot health exam. Foot pain and numbness, as well as not wearing proper footwear, can make you more likely to have a fall.  Depression screening. You may be more likely to have a fall if you have a fear of falling, feel emotionally low, or feel unable to do activities that you used to do.  Alcohol use screening. Using too much alcohol can affect your balance and may make you more likely to have a fall. What actions can I take to lower my risk of falls? General instructions  Talk with your health care provider about your risks for falling. Tell your health care provider if: ? You fall. Be sure to tell your health care provider about all falls, even ones that seem minor. ? You feel dizzy, sleepy, or off-balance.  Take over-the-counter and prescription medicines only as told by your health care provider. These include any supplements.  Eat a healthy diet and maintain a healthy weight. A  healthy diet includes low-fat dairy products, low-fat (lean) meats, and fiber from whole grains, beans, and lots of fruits and vegetables. Home safety  Remove any tripping hazards, such as rugs, cords, and clutter.  Install safety equipment such as grab bars in bathrooms and safety rails on stairs.  Keep rooms and walkways well-lit. Activity   Follow a regular exercise program to stay fit. This will help you maintain your balance. Ask your health care provider what types of exercise are appropriate for you.  If you need a cane or walker, use it as recommended by your health care provider.  Wear supportive shoes that have nonskid soles.  Lifestyle  Do not drink alcohol if your health care provider tells you not to drink.  If you drink alcohol, limit how much you have: ? 0-1 drink a day for women. ? 0-2 drinks a day for men.  Be aware of how much alcohol is in your drink. In the [place] one drink equals one typical bottle of beer (12 oz), one-half glass of wine (5 oz), or one shot of hard liquor (1 oz).  Do not use any products that contain nicotine or tobacco, such as cigarettes and e-cigarettes. If you need help quitting, ask your health care provider. Summary  Having a healthy lifestyle and getting preventive care can help to protect your health and wellness after age 75.  Screening and testing are the best way to find a health problem early and help you avoid having a fall. Early diagnosis and treatment give you the best chance for managing medical conditions that are more common for people who are older than age 3.  Falls are a major cause of broken bones and head injuries in people who are older than age 20. Take precautions to prevent a fall at home.  Work with your health care provider to learn what changes you can make to improve your health and wellness and to prevent falls. This information is not intended to replace advice given to you by your health care provider. Make  sure you discuss any questions you have with your health care provider. Document Released: 10/29/2017 Document Revised: 04/08/2019 Document Reviewed: 10/29/2017 Elsevier Patient Education  2020 [employer] Dagoberto Nealy M.D.

## 2019-07-21 ENCOUNTER — Other Ambulatory Visit: Payer: Self-pay

## 2019-07-21 ENCOUNTER — Ambulatory Visit (INDEPENDENT_AMBULATORY_CARE_PROVIDER_SITE_OTHER): Payer: Medicare Other | Admitting: Internal Medicine

## 2019-07-21 ENCOUNTER — Encounter: Payer: Self-pay | Admitting: Internal Medicine

## 2019-07-21 VITALS — BP 138/72 | HR 65 | Temp 98.2°F | Ht 73.0 in | Wt 278.0 lb

## 2019-07-21 DIAGNOSIS — Z79899 Other long term (current) drug therapy: Secondary | ICD-10-CM | POA: Diagnosis not present

## 2019-07-21 DIAGNOSIS — E119 Type 2 diabetes mellitus without complications: Secondary | ICD-10-CM

## 2019-07-21 DIAGNOSIS — D696 Thrombocytopenia, unspecified: Secondary | ICD-10-CM | POA: Diagnosis not present

## 2019-07-21 DIAGNOSIS — E1159 Type 2 diabetes mellitus with other circulatory complications: Secondary | ICD-10-CM | POA: Diagnosis not present

## 2019-07-21 DIAGNOSIS — Z0000 Encounter for general adult medical examination without abnormal findings: Secondary | ICD-10-CM

## 2019-07-21 DIAGNOSIS — Z125 Encounter for screening for malignant neoplasm of prostate: Secondary | ICD-10-CM

## 2019-07-21 DIAGNOSIS — G4733 Obstructive sleep apnea (adult) (pediatric): Secondary | ICD-10-CM

## 2019-07-21 DIAGNOSIS — E7800 Pure hypercholesterolemia, unspecified: Secondary | ICD-10-CM | POA: Diagnosis not present

## 2019-07-21 DIAGNOSIS — Z7901 Long term (current) use of anticoagulants: Secondary | ICD-10-CM | POA: Diagnosis not present

## 2019-07-21 DIAGNOSIS — G479 Sleep disorder, unspecified: Secondary | ICD-10-CM

## 2019-07-21 LAB — HEMOGLOBIN A1C: Hgb A1c MFr Bld: 6.7 % — ABNORMAL HIGH (ref 4.6–6.5)

## 2019-07-21 LAB — BASIC METABOLIC PANEL
BUN: 18 mg/dL (ref 6–23)
CO2: 29 mEq/L (ref 19–32)
Calcium: 10 mg/dL (ref 8.4–10.5)
Chloride: 103 mEq/L (ref 96–112)
Creatinine, Ser: 1.13 mg/dL (ref 0.40–1.50)
GFR: 64.21 mL/min (ref >60.00)
Glucose, Bld: 118 mg/dL — ABNORMAL HIGH (ref 70–99)
Potassium: 4.3 mEq/L (ref 3.5–5.1)
Sodium: 139 mEq/L (ref 135–145)

## 2019-07-21 LAB — CBC WITH DIFFERENTIAL/PLATELET
Basophils Absolute: 0 10*3/uL (ref 0.0–0.1)
Basophils Relative: 0.3 % (ref 0.0–3.0)
Eosinophils Absolute: 0.6 10*3/uL (ref 0.0–0.7)
Eosinophils Relative: 7.1 % — ABNORMAL HIGH (ref 0.0–5.0)
HCT: 46.6 % (ref 39.0–52.0)
Hemoglobin: 15.8 g/dL (ref 13.0–17.0)
Lymphocytes Relative: 22.7 % (ref 12.0–46.0)
Lymphs Abs: 1.9 10*3/uL (ref 0.7–4.0)
MCHC: 33.9 g/dL (ref 30.0–36.0)
MCV: 92.7 fl (ref 78.0–100.0)
Monocytes Absolute: 0.5 10*3/uL (ref 0.1–1.0)
Monocytes Relative: 6.7 % (ref 3.0–12.0)
Neutro Abs: 5.2 10*3/uL (ref 1.4–7.7)
Neutrophils Relative %: 63.2 % (ref 43.0–77.0)
Platelets: 149 10*3/uL — ABNORMAL LOW (ref 150.0–400.0)
RBC: 5.02 Mil/uL (ref 4.22–5.81)
RDW: 13.8 % (ref 11.5–15.5)
WBC: 8.2 10*3/uL (ref 4.0–10.5)

## 2019-07-21 LAB — PSA: PSA: 1.12 ng/mL (ref 0.10–4.00)

## 2019-07-21 MED ORDER — METFORMIN HCL ER 500 MG PO TB24: ORAL_TABLET | ORAL | 3 refills | Status: DC

## 2019-07-21 NOTE — Patient Instructions (Addendum)
Glad you are doing well.    Continue  Healthy eating and weight loss .    Will notify you  of labs when available.   Plan  6 mos ROV and lab as appropriate  if needed.     Health Maintenance After Age 70 After age 48, you are at a higher risk for certain long-term diseases and infections as well as injuries from falls. Falls are a major cause of broken bones and head injuries in people who are older than age 70. Getting regular preventive care can help to keep you healthy and well. Preventive care includes getting regular testing and making lifestyle changes as recommended by your health care provider. Talk with your health care provider about:  Which screenings and tests you should have. A screening is a test that checks for a disease when you have no symptoms.  A diet and exercise plan that is right for you. What should I know about screenings and tests to prevent falls? Screening and testing are the best ways to find a health problem early. Early diagnosis and treatment give you the best chance of managing medical conditions that are common after age 70. Certain conditions and lifestyle choices may make you more likely to have a fall. Your health care provider may recommend:  Regular vision checks. Poor vision and conditions such as cataracts can make you more likely to have a fall. If you wear glasses, make sure to get your prescription updated if your vision changes.  Medicine review. Work with your health care provider to regularly review all of the medicines you are taking, including over-the-counter medicines. Ask your health care provider about any side effects that may make you more likely to have a fall. Tell your health care provider if any medicines that you take make you feel dizzy or sleepy.  Osteoporosis screening. Osteoporosis is a condition that causes the bones to get weaker. This can make the bones weak and cause them to break more easily.  Blood pressure screening.  Blood pressure changes and medicines to control blood pressure can make you feel dizzy.  Strength and balance checks. Your health care provider may recommend certain tests to check your strength and balance while standing, walking, or changing positions.  Foot health exam. Foot pain and numbness, as well as not wearing proper footwear, can make you more likely to have a fall.  Depression screening. You may be more likely to have a fall if you have a fear of falling, feel emotionally low, or feel unable to do activities that you used to do.  Alcohol use screening. Using too much alcohol can affect your balance and may make you more likely to have a fall. What actions can I take to lower my risk of falls? General instructions  Talk with your health care provider about your risks for falling. Tell your health care provider if: ? You fall. Be sure to tell your health care provider about all falls, even ones that seem minor. ? You feel dizzy, sleepy, or off-balance.  Take over-the-counter and prescription medicines only as told by your health care provider. These include any supplements.  Eat a healthy diet and maintain a healthy weight. A healthy diet includes low-fat dairy products, low-fat (lean) meats, and fiber from whole grains, beans, and lots of fruits and vegetables. Home safety  Remove any tripping hazards, such as rugs, cords, and clutter.  Install safety equipment such as grab bars in bathrooms and safety rails on  stairs.  Keep rooms and walkways well-lit. Activity   Follow a regular exercise program to stay fit. This will help you maintain your balance. Ask your health care provider what types of exercise are appropriate for you.  If you need a cane or walker, use it as recommended by your health care provider.  Wear supportive shoes that have nonskid soles. Lifestyle  Do not drink alcohol if your health care provider tells you not to drink.  If you drink alcohol, limit  how much you have: ? 0-1 drink a day for women. ? 0-2 drinks a day for men.  Be aware of how much alcohol is in your drink. In the [place] one drink equals one typical bottle of beer (12 oz), one-half glass of wine (5 oz), or one shot of hard liquor (1 oz).  Do not use any products that contain nicotine or tobacco, such as cigarettes and e-cigarettes. If you need help quitting, ask your health care provider. Summary  Having a healthy lifestyle and getting preventive care can help to protect your health and wellness after age 70.  Screening and testing are the best way to find a health problem early and help you avoid having a fall. Early diagnosis and treatment give you the best chance for managing medical conditions that are more common for people who are older than age 57.  Falls are a major cause of broken bones and head injuries in people who are older than age 70. Take precautions to prevent a fall at home.  Work with your health care provider to learn what changes you can make to improve your health and wellness and to prevent falls. This information is not intended to replace advice given to you by your health care provider. Make sure you discuss any questions you have with your health care provider. Document Released: 10/29/2017 Document Revised: 04/08/2019 Document Reviewed: 10/29/2017 Elsevier Patient Education  2020 [employer].

## 2019-08-06 ENCOUNTER — Encounter: Payer: Self-pay | Admitting: Internal Medicine

## 2019-08-06 ENCOUNTER — Ambulatory Visit: Payer: Self-pay

## 2019-08-06 ENCOUNTER — Other Ambulatory Visit: Payer: Self-pay

## 2019-08-06 ENCOUNTER — Telehealth (INDEPENDENT_AMBULATORY_CARE_PROVIDER_SITE_OTHER): Payer: Medicare Other | Admitting: Internal Medicine

## 2019-08-06 DIAGNOSIS — H47012 Ischemic optic neuropathy, left eye: Secondary | ICD-10-CM

## 2019-08-06 DIAGNOSIS — Z7952 Long term (current) use of systemic steroids: Secondary | ICD-10-CM

## 2019-08-06 DIAGNOSIS — E1159 Type 2 diabetes mellitus with other circulatory complications: Secondary | ICD-10-CM

## 2019-08-06 DIAGNOSIS — L03115 Cellulitis of right lower limb: Secondary | ICD-10-CM | POA: Diagnosis not present

## 2019-08-06 MED ORDER — DOXYCYCLINE HYCLATE 100 MG PO CAPS: 100.0000 mg | ORAL_CAPSULE | Freq: Two times a day (BID) | ORAL | 0 refills | Status: DC

## 2019-08-06 NOTE — Telephone Encounter (Signed)
Patient called stating that he has pain to Right ankle. It is slightly swollen and hot to touch. He rates pain at 3-4. He has no fever. He feels that his symptoms are caused by Strep infection. He has has issues with Strep. Care advice read to patient. He verbalized understanding of instructions. Call was placed to office. No answer. Note will be routed for scheduling.  Reason for Disposition . [1] Redness of the skin AND [2] no fever  Answer Assessment - Initial Assessment Questions 1. ONSET: "When did the pain start?"      Right ankle last night 2. LOCATION: "Where is the pain located?"     Right ankle 3. PAIN: "How bad is the pain?"    (Scale 1-10; or mild, moderate, severe)  - MILD (1-3): doesn't interfere with normal activities   - MODERATE (4-7): interferes with normal activities (e.g., work or school) or awakens from sleep, limping   - SEVERE (8-10): excruciating pain, unable to do any normal activities, unable to walk     4-5 4. WORK OR EXERCISE: "Has there been any recent work or exercise that involved this part of the body?"      no 5. CAUSE: "What do you think is causing the ankle pain?"    Staff infection 6. OTHER SYMPTOMS: "Do you have any other symptoms?" (e.g., calf pain, rash, fever, swelling)    Swelling, redness, heat to area,  7. PREGNANCY: "Is there any chance you are pregnant?" "When was your last menstrual period?"    N/A  Protocols used: ANKLE PAIN-A-AH

## 2019-08-06 NOTE — Telephone Encounter (Signed)
lvm for pt to call back to set up an appt for pt

## 2019-08-06 NOTE — Progress Notes (Signed)
Virtual Visit via Video Note  I connected with@ on 08/06/19 at  3:00 PM EDT by a video enabled telemedicine application and verified that I am speaking with the correct person using two identifiers. Location patient: home Location provider:work  office Persons participating in the virtual visit: patient, provider  [place]th national recommendations  regarding COVID 19 pandemic   video visit is advised over in office visit for this patient.  Patient aware  of the limitations of evaluation and management by telemedicine and  availability of in person appointments. and agreed to proceed.   HPI: [name] [name]. presents for video visit C/o of one day of increasing swelling redness and pain near right medial ankle  And thinks its early cellulitis  ( hx of same) was walking this am  Now more pain .  No fever.  This week had to be hosp at [facility] for ischemic neuritis in the "other eye " and received    Iv methylprednisolone  And now on pred 60 per day  For 10 days and has fu in Sept.   DM has been controlled    bg now  up in 200 180     Asks about inc metformin to4 per day until better   ROS: See pertinent positives and negatives per HPI. No fever chills trauma   Past Medical History:  Diagnosis Date  . Atrial fibrillation ([place]) 03/22/2009   a. s/p multiple DCCV; b. no coumadin due to low TE risk profile; c. Tikosyn Rx  . Coronary atherosclerosis of native coronary artery 11/2002   a. s/p stent to LAD 12/03; OM2 occluded at cath 12/03; d. myoview 5/10: no ischemia;  e. echo 7/11: EF 55%, BAE, mild RVE, PASP 41-45; Myoview was in March 2013. There was no ischemia or infarction, EF 51%   . Cutaneous abscess of back excluding buttocks 07/04/2014   Appears to stem from possibly a cyst very large area 6 cm contact surgeon office   . Diabetes mellitus without complication ([place])   . Drusen body    see opth note  . ERECTILE DYSFUNCTION 03/22/2009  . GERD 03/22/2009  . HYPERGLYCEMIA 04/25/2010  .  HYPERLIPIDEMIA 03/22/2009  . 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 LATERAL EPICONDYLITIS, LEFT 10/24/2009  . LIVER FUNCTION TESTS, ABNORMAL 04/25/2010  . Local reaction to immunization 05/05/2012   minor resolving  zostavax   . Myocardial infarction ([place]) mi2003  . Numbness in left leg    foot related to back disease and surgery  . Obesity, unspecified 04/24/2009  . Perforated appendicitis with necrosis s/p open appendectomy 06/07/14 06/04/2014  . Renal cyst    Characterized by MRI as simple  . Ruptured suppurative appendicitis    2015   . SLEEP APNEA, OBSTRUCTIVE 03/22/2009   compliant with CPAP  . THROMBOCYTOPENIA 08/16/2010  . TOBACCO USE, QUIT 10/24/2009  . 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 Umbilical hernia     Past Surgical History:  Procedure Laterality Date  . APPENDECTOMY N/A 06/06/2014   Procedure: APPENDECTOMY;  Surgeon: Odis Hollingshead, MD;  Location: [facility];  Service: General;  Laterality: N/A;  . COLON SURGERY    . COLONOSCOPY  11/15/2011   Procedure: COLONOSCOPY;  Surgeon: Juanita Craver, MD;  [facility];  Service: Endoscopy;  Laterality: N/A;  . COLONOSCOPY WITH PROPOFOL N/A 01/03/2017   Procedure: COLONOSCOPY WITH PROPOFOL;  Surgeon: Carol Ada, MD;  [facility];  Service: Endoscopy;  Laterality: N/A;  . cyst on epiglottis  08/2002  . ESOPHAGOGASTRODUODENOSCOPY  11/15/2011   Procedure: ESOPHAGOGASTRODUODENOSCOPY (EGD);  Surgeon: Juanita Craver, MD;  [facility];  Service: Endoscopy;  Laterality: N/A;  . LAPAROSCOPIC APPENDECTOMY N/A 06/06/2014   Procedure: APPENDECTOMY LAPAROSCOPIC attemted;  Surgeon: Odis Hollingshead, MD;  [facility];  Service: General;  Laterality: N/A;  . LUMBAR DISC SURGERY     two  holes in spinalcovering  . stent     LAD DUS 2004  . surgery l4-l5   1998   ruptured x 3  . ulnar neuropathy    . UMBILICAL HERNIA REPAIR     mesh    Family History  Problem Relation Age of  Onset  . Thyroid disease Mother   . Ovarian cancer Mother   . Breast cancer Mother   . Lung cancer Father   . Cancer Father     Social History   Tobacco Use  . Smoking status: Former Smoker    Packs/day: 2.00    Years: 42.00    Pack years: 84.00    Types: Cigarettes    Quit date: 12/30/2008    Years since quitting: 10.6  . Smokeless tobacco: Never Used  . Tobacco comment: started at age 47; 1-2 ppd; quit 2010  Substance Use Topics  . Alcohol use: No    Alcohol/week: 0.0 standard drinks    Comment: rarely; maybe 1 beer a year  . Drug use: No      Current Outpatient Medications:  .  acetaminophen (TYLENOL) 325 MG tablet, Take 650 mg by mouth every 6 (six) hours as needed for mild pain., Disp: , Rfl:  .  albuterol (PROVENTIL HFA;VENTOLIN HFA) 108 (90 Base) MCG/ACT inhaler, Inhale 2 puffs into the lungs every 6 (six) hours as needed for wheezing or shortness of breath., Disp: 1 Inhaler, Rfl: 0 .  ALPHAGAN P 0.1 % SOLN, INSTILL 1 DROP INTO RIGHT EYE 3 TIMES A DAY, Disp: , Rfl: 3 .  atorvastatin (LIPITOR) 80 MG tablet, TAKE 1 TABLET BY MOUTH  DAILY, Disp: 90 tablet, Rfl: 1 .  diphenhydrAMINE (BENADRYL) 25 MG tablet, Take 25 mg by mouth at bedtime as needed for sleep., Disp: , Rfl:  .  dofetilide (TIKOSYN) 500 MCG capsule, Take 1 capsule (500 mcg total) by mouth 2 (two) times daily., Disp: 180 capsule, Rfl: 2 .  doxycycline (VIBRAMYCIN) 100 MG capsule, Take 1 capsule (100 mg total) by mouth 2 (two) times daily., Disp: 20 capsule, Rfl: 0 .  famotidine (PEPCID) 10 MG tablet, Take 10 mg by mouth as needed for heartburn. , Disp: , Rfl:  .  fexofenadine-pseudoephedrine (ALLEGRA-D) 60-120 MG 12 hr tablet, Take 1 tablet by mouth 2 (two) times daily., Disp: , Rfl:  .  fish oil-omega-3 fatty acids 1000 MG capsule, Take 1 g by mouth 2 (two) times daily. , Disp: , Rfl:  .  fluticasone (FLONASE) 50 MCG/ACT nasal spray, Place 2 sprays into both nostrils daily as needed for allergies., Disp: 48 g,  Rfl: prn .  fluticasone furoate-vilanterol (BREO ELLIPTA) 200-25 MCG/INH AEPB, Inhale 1 puff into the lungs daily., Disp: , Rfl:  .  KLOR-CON M20 20 MEQ tablet, TAKE 1 TABLET BY MOUTH EVERY DAY, Disp: 90 tablet, Rfl: 3 .  Magnesium Oxide 500 MG TABS, Take 750 mg by mouth daily., Disp: , Rfl:  .  metFORMIN (GLUCOPHAGE-XR) 500 MG 24 hr tablet, Take as directed, Disp: 270 tablet, Rfl: 3 .  MULTIPLE VITAMIN PO,  Take 1 tablet by mouth every evening. , Disp: , Rfl:  .  nitroGLYCERIN (NITROSTAT) 0.4 MG SL tablet, Place 1 tablet (0.4 mg total) under the tongue every 5 (five) minutes as needed for chest pain., Disp: 25 tablet, Rfl: 12 .  ondansetron (ZOFRAN ODT) 4 MG disintegrating tablet, Take 1 tablet (4 mg total) by mouth every 8 (eight) hours as needed for nausea or vomiting., Disp: 24 tablet, Rfl: 0 .  ONETOUCH VERIO test strip, USE TWICE A DAY, Disp: 50 each, Rfl: 12 .  PRADAXA 150 MG CAPS capsule, TAKE 1 CAPSULE BY MOUTH  EVERY 12 HOURS, Disp: 180 capsule, Rfl: 1 .  Suvorexant (BELSOMRA) 20 MG TABS, Take 1 tablet by mouth at bedtime., Disp: 90 tablet, Rfl: 0 .  Suvorexant (BELSOMRA) 20 MG TABS, Take 1 tablet by mouth at bedtime., Disp: 90 tablet, Rfl: 1 .  tamsulosin (FLOMAX) 0.4 MG CAPS capsule, TAKE 1 CAPSULE BY MOUTH EVERY DAY, Disp: 30 capsule, Rfl: 5 .  triamcinolone cream (KENALOG) 0.1 %, Apply 1 application topically 2 (two) times daily. As needed., Disp: 30 g, Rfl: 0  Current Facility-Administered Medications:  .  ipratropium-albuterol (DUONEB) 0.5-2.5 (3) MG/3ML nebulizer solution 3 mL, 3 mL, Nebulization, Q6H, , Standley Brooking, MD, 3 mL at 01/02/18 1545  EXAM: BP Readings from Last 3 Encounters:  07/21/19 138/72  02/18/19 124/70  02/12/19 130/66    VITALS per patient if applicable: says weight about  275 range  GENERAL: alert, oriented, appears well and in no acute distress HEENT: atraumatic, conjunttiva clear, no obvious abnormalities on inspection of external nose and ears NECK:  normal movements of the head and neck LUNGS: on inspection no signs of respiratory distress, breathing rate appears normal, no obvious gross SOB, gasping or wheezing CV: no obvious cyanosis  MS: moves all visible extremities RLE with mild puffiness medial ankle  With redness at malleolar and proximal diffuse  redns  About 1/3 up leg  No ulcer   Good rom  PSYCH/NEURO: pleasant and cooperative, no obvious depression or anxiety, speech and thought processing grossly intact Lab Results  Component Value Date   WBC 8.2 07/21/2019   HGB 15.8 07/21/2019   HCT 46.6 07/21/2019   PLT 149.0 (L) 07/21/2019   GLUCOSE 118 (H) 07/21/2019   CHOL 85 02/08/2019   TRIG 110.0 02/08/2019   HDL 30.00 (L) 02/08/2019   LDLCALC 33 02/08/2019   ALT 48 02/08/2019   AST 30 02/08/2019   NA 139 07/21/2019   K 4.3 07/21/2019   CL 103 07/21/2019   CREATININE 1.13 07/21/2019   BUN 18 07/21/2019   CO2 29 07/21/2019   TSH 1.91 06/25/2018   PSA 1.12 07/21/2019   INR 1.31 12/16/2016   HGBA1C 6.7 (H) 07/21/2019   MICROALBUR 18.7 (H) 02/08/2019    ASSESSMENT AND PLAN:  Discussed the following assessment and plan:    ICD-10-CM   1. Cellulitis of right lower extremity  L03.115   2. 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 Current use of steroid medication  Z79.52   4. Type 2 diabetes mellitus with other circulatory complications (HCC)  E75.17    probably cellulitis early rle  Based on hx exam and now on prednisone   doxy 100 bid for 10 days and  Care and back with [place] if needed. Diabetes ok to inc to metformin 4x 500 per day if needed while on prednisone   Counseled.   Expectant management and discussion of plan  and treatment with opportunity to ask questions and all were answered. The patient agreed with the plan and demonstrated an understanding of the instructions.   Advised to call back or seek an in-person evaluation if worsening  or having  further concerns .   Shanon Ace, MD

## 2019-08-09 NOTE — Telephone Encounter (Signed)
See note

## 2019-08-09 NOTE — Telephone Encounter (Signed)
Pt already had an appt Friday

## 2019-08-16 ENCOUNTER — Ambulatory Visit: Payer: Medicare Other

## 2019-08-17 ENCOUNTER — Encounter: Payer: Self-pay | Admitting: Orthopaedic Surgery

## 2019-08-17 ENCOUNTER — Ambulatory Visit: Payer: Self-pay

## 2019-08-17 ENCOUNTER — Ambulatory Visit (INDEPENDENT_AMBULATORY_CARE_PROVIDER_SITE_OTHER): Payer: Medicare Other | Admitting: Orthopaedic Surgery

## 2019-08-17 DIAGNOSIS — M545 Low back pain, unspecified: Secondary | ICD-10-CM

## 2019-08-17 MED ORDER — HYDROCODONE-ACETAMINOPHEN 5-325 MG PO TABS: 1.0000 | ORAL_TABLET | ORAL | 0 refills | Status: DC | PRN

## 2019-08-17 MED ORDER — CYCLOBENZAPRINE HCL 10 MG PO TABS: 10.0000 mg | ORAL_TABLET | Freq: Three times a day (TID) | ORAL | 0 refills | Status: DC | PRN

## 2019-08-17 NOTE — Progress Notes (Signed)
Office Visit Note   Patient: [name] [name].           Date of Birth: Nov 29, 1951           MRN: [id number] Visit Date: 08/17/2019              Requested by: Burnis Medin, MD [place],  [place] [address] PCP: Burnis Medin, MD   Assessment & Plan: Visit Diagnoses:  1. Acute midline low back pain without sciatica     Plan: Plan we will have him stop the Robaxin will place him on Flexeril and also getting some Norco for pain is not to drive or operate heavy equipment while on these medications.  We will also send him to formal physical therapy for stretching, back exercises, home exercise program and modalities.  If his condition becomes worse or does not respond to this consider possible epidural steroid injection versus repeat MRI.  If he develops any symptoms with bowel bladder dysfunction or saddle anesthesia like symptoms he is to go to ER.  He may need referral back to Dr. Sherwood Gambler if conservative measures fail.  Follow-Up Instructions: Return in about 4 weeks (around 09/14/2019).   Orders:  Orders Placed This Encounter  Procedures  . XR Lumbar Spine 2-3 Views   No orders of the defined types were placed in this encounter.     Procedures: No procedures performed   Clinical Data: No additional findings.   Subjective: Chief Complaint  Patient presents with  . Lower Back - Pain    HPI Patient is 68 year old male well-known to our department service comes in today with acute low back pain for the past 5 to 6 days.  No known injury.  Patient with a history of lumbar discectomies by Dr. Sherwood Gambler in the late 1990s.  Is also undergone MRI of his lumbar spine back in August 2018 that showed multiple levels of degenerative changes, multilevel severe neural foraminal stenosis and moderate L2-L3 spinal canal stenosis.  States that this back pain is different than before his warts the left of the midline where his back pain in the past been more on the  right.  He is also had a recent hospitalization due to optic neuritis on the left had to have IV steroids and prednisone taper.  Then he developed right ankle cellulitis and just finished the doxycycline for this.  States that his back pain is 8-9 out of 10 most of the time and at worst is 10 out of 10 pain.  Having no radicular symptoms.  No awakenings pain but he does have difficulty going to sleep due to the back pain.  No saddle anesthesia like symptoms.  No bladder incontinence he has had some constipation which he feels is due to taking tramadol.  He is also been taking Robaxin.  Review of Systems Negative for fevers or chills please see HPI otherwise negative or noncontributory.  Objective: Vital Signs: There were no vitals taken for this visit.  Physical Exam Constitutional:      Appearance: He is not ill-appearing or diaphoretic.  Cardiovascular:     Pulses: Normal pulses.  Pulmonary:     Effort: Pulmonary effort is normal.  Neurological:     Mental Status: He is alert and oriented to person, place, and time.  Psychiatric:        Mood and Affect: Mood normal.     Ortho Exam Bilateral hips excellent range of motion without pain.  Negative straight leg raise bilaterally.  5 out of 5 strength throughout lower extremities against resistance.  Deep tendon reflexes are 2+ at the knees and ankles and equal and symmetric.  Subjective decreased sensation left great toe and the plantar aspect of the left foot otherwise sensation intact bilateral lower extremities.  No tenderness over his lumbar spinal column or paraspinous region. Specialty Comments:  No specialty comments available.  Imaging: Xr Lumbar Spine 2-3 Views  Result Date: 08/17/2019 Radiographs 2 views lumbar spine: No acute fracture.  No spondylolisthesis.  Loss of lordotic curvature.  Disc space narrowing at L4-5 and L5-S1.    PMFS History: Patient Active Problem List   Diagnosis Date Noted  . Acute back pain with  sciatica, right 09/04/2017  . Right hip pain 09/04/2017  . Thrombocytopenia ([place]) 06/25/2016  . Atrial fibrillation ([place]) 01/01/2016  . 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 Iliac aneurysm ([place])   . Morbid obesity ([place]) 07/08/2014  . Type 2 diabetes mellitus with other circulatory complications ([place]) 62/95/2841  . Chronic anticoagulation 12/21/2013  . Sleep disorder 06/21/2013  . Decreased hearing 08/06/2012  . Hemorrhoids 08/06/2012  . High risk medication use 05/05/2012  . Anticoagulation management encounter 05/05/2012  . Retinal tear 04/29/2012  . Personal history of colonic polyps 05/05/2011  . Visit for preventive health examination 05/05/2011  . PALPITATIONS 08/16/2010  . LIVER FUNCTION TESTS, ABNORMAL 04/25/2010  . Lateral epicondylitis 10/24/2009  . TOBACCO USE, QUIT 10/24/2009  . OBESITY, UNSPECIFIED 04/24/2009  . Hyperlipidemia 03/22/2009  . ERECTILE DYSFUNCTION 03/22/2009  . Obstructive sleep apnea 03/22/2009  . 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 MYOCARDIAL INFARCTION, HX OF 03/22/2009  . Coronary atherosclerosis 03/22/2009  . Atrial fibrillation with RVR ([place]) 03/22/2009  . GERD 03/22/2009   Past Medical History:  Diagnosis Date  . Atrial fibrillation ([place]) 03/22/2009   a. s/p multiple DCCV; b. no coumadin due to low TE risk profile; c. Tikosyn Rx  . Coronary atherosclerosis of native coronary artery 11/2002   a. s/p stent to LAD 12/03; OM2 occluded at cath 12/03; d. myoview 5/10: no ischemia;  e. echo 7/11: EF 55%, BAE, mild RVE, PASP 41-45; Myoview was in March 2013. There was no ischemia or infarction, EF 51%   . Cutaneous abscess of back excluding buttocks 07/04/2014   Appears to stem from possibly a cyst very large area 6 cm contact surgeon office   . Diabetes mellitus without complication ([place])   . Drusen body    see opth note  . ERECTILE DYSFUNCTION 03/22/2009  . GERD 03/22/2009  . HYPERGLYCEMIA 04/25/2010  . HYPERLIPIDEMIA 03/22/2009  . Iliac aneurysm  (HCC)    2.6 to be evaluated incidental finding on CT  . LATERAL EPICONDYLITIS, LEFT 10/24/2009  . LIVER FUNCTION TESTS, ABNORMAL 04/25/2010  . Local reaction to immunization 05/05/2012   minor resolving  zostavax   . Myocardial infarction ([place]) mi2003  . Numbness in left leg    foot related to back disease and surgery  . Obesity, unspecified 04/24/2009  . Perforated appendicitis with necrosis s/p open appendectomy 06/07/14 06/04/2014  . Renal cyst    Characterized by MRI as simple  . Ruptured suppurative appendicitis    2015   . SLEEP APNEA, OBSTRUCTIVE 03/22/2009   compliant with CPAP  . THROMBOCYTOPENIA 08/16/2010  . TOBACCO USE, QUIT 10/24/2009  . 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 Umbilical hernia     Family History  Problem Relation Age of Onset  . Thyroid disease  Mother   . Ovarian cancer Mother   . Breast cancer Mother   . Lung cancer Father   . Cancer Father     Past Surgical History:  Procedure Laterality Date  . APPENDECTOMY N/A 06/06/2014   Procedure: APPENDECTOMY;  Surgeon: Odis Hollingshead, MD;  [facility];  Service: General;  Laterality: N/A;  . COLON SURGERY    . COLONOSCOPY  11/15/2011   Procedure: COLONOSCOPY;  Surgeon: Juanita Craver, MD;  [facility];  Service: Endoscopy;  Laterality: N/A;  . COLONOSCOPY WITH PROPOFOL N/A 01/03/2017   Procedure: COLONOSCOPY WITH PROPOFOL;  Surgeon: Carol Ada, MD;  [facility];  Service: Endoscopy;  Laterality: N/A;  . cyst on epiglottis  08/2002  . ESOPHAGOGASTRODUODENOSCOPY  11/15/2011   Procedure: ESOPHAGOGASTRODUODENOSCOPY (EGD);  Surgeon: Juanita Craver, MD;  [facility];  Service: Endoscopy;  Laterality: N/A;  . LAPAROSCOPIC APPENDECTOMY N/A 06/06/2014   Procedure: APPENDECTOMY LAPAROSCOPIC attemted;  Surgeon: Odis Hollingshead, MD;  [facility];  Service: General;  Laterality: N/A;  . LUMBAR DISC SURGERY     two  holes in spinalcovering  . stent     LAD DUS 2004  . surgery l4-l5    1998   ruptured x 3  . ulnar neuropathy    . UMBILICAL HERNIA REPAIR     mesh   Social History   Occupational History  . Not on file  Tobacco Use  . Smoking status: Former Smoker    Packs/day: 2.00    Years: 42.00    Pack years: 84.00    Types: Cigarettes    Quit date: 12/30/2008    Years since quitting: 10.6  . Smokeless tobacco: Never Used  . Tobacco comment: started at age 46; 1-2 ppd; quit 2010  Substance and Sexual Activity  . Alcohol use: No    Alcohol/week: 0.0 standard drinks    Comment: rarely; maybe 1 beer a year  . Drug use: No  . Sexual activity: Yes

## 2019-08-19 ENCOUNTER — Emergency Department (HOSPITAL_COMMUNITY)
Admission: EM | Admit: 2019-08-19 | Discharge: 2019-08-20 | Disposition: A | Discharge status: Home / Self Care | Payer: Medicare Other | Attending: Emergency Medicine | Admitting: Emergency Medicine

## 2019-08-19 ENCOUNTER — Telehealth: Payer: Self-pay | Admitting: Orthopaedic Surgery

## 2019-08-19 ENCOUNTER — Emergency Department (HOSPITAL_COMMUNITY): Payer: Medicare Other

## 2019-08-19 ENCOUNTER — Encounter (HOSPITAL_COMMUNITY): Payer: Self-pay

## 2019-08-19 ENCOUNTER — Other Ambulatory Visit: Payer: Self-pay

## 2019-08-19 DIAGNOSIS — Z87891 Personal history of nicotine dependence: Secondary | ICD-10-CM | POA: Diagnosis not present

## 2019-08-19 DIAGNOSIS — I493 Ventricular premature depolarization: Secondary | ICD-10-CM | POA: Insufficient documentation

## 2019-08-19 DIAGNOSIS — R009 Unspecified abnormalities of heart beat: Secondary | ICD-10-CM | POA: Diagnosis present

## 2019-08-19 DIAGNOSIS — Z7984 Long term (current) use of oral hypoglycemic drugs: Secondary | ICD-10-CM | POA: Insufficient documentation

## 2019-08-19 DIAGNOSIS — Z8249 Family history of ischemic heart disease and other diseases of the circulatory system: Secondary | ICD-10-CM | POA: Insufficient documentation

## 2019-08-19 DIAGNOSIS — M545 Low back pain, unspecified: Secondary | ICD-10-CM

## 2019-08-19 DIAGNOSIS — I723 Aneurysm of iliac artery: Secondary | ICD-10-CM | POA: Insufficient documentation

## 2019-08-19 DIAGNOSIS — E119 Type 2 diabetes mellitus without complications: Secondary | ICD-10-CM | POA: Insufficient documentation

## 2019-08-19 DIAGNOSIS — Z79899 Other long term (current) drug therapy: Secondary | ICD-10-CM | POA: Diagnosis not present

## 2019-08-19 DIAGNOSIS — M533 Sacrococcygeal disorders, not elsewhere classified: Secondary | ICD-10-CM | POA: Diagnosis not present

## 2019-08-19 LAB — BASIC METABOLIC PANEL
Anion gap: 13 (ref 5–15)
BUN: 19 mg/dL (ref 8–23)
CO2: 20 mmol/L — ABNORMAL LOW (ref 22–32)
Calcium: 9.5 mg/dL (ref 8.9–10.3)
Chloride: 101 mmol/L (ref 98–111)
Creatinine, Ser: 1.1 mg/dL (ref 0.61–1.24)
GFR calc Af Amer: >60 mL/min (ref >60)
GFR calc non Af Amer: >60 mL/min (ref >60)
Glucose, Bld: 121 mg/dL — ABNORMAL HIGH (ref 70–99)
Potassium: 4.3 mmol/L (ref 3.5–5.1)
Sodium: 134 mmol/L — ABNORMAL LOW (ref 135–145)

## 2019-08-19 LAB — CBC
HCT: 53.2 % — ABNORMAL HIGH (ref 39.0–52.0)
Hemoglobin: 18.3 g/dL — ABNORMAL HIGH (ref 13.0–17.0)
MCH: 31.1 pg (ref 26.0–34.0)
MCHC: 34.4 g/dL (ref 30.0–36.0)
MCV: 90.3 fL (ref 80.0–100.0)
Platelets: 145 10*3/uL — ABNORMAL LOW (ref 150–400)
RBC: 5.89 MIL/uL — ABNORMAL HIGH (ref 4.22–5.81)
RDW: 13.2 % (ref 11.5–15.5)
WBC: 13.5 10*3/uL — ABNORMAL HIGH (ref 4.0–10.5)
nRBC: 0 % (ref 0.0–0.2)

## 2019-08-19 LAB — TROPONIN I (HIGH SENSITIVITY)
Troponin I (High Sensitivity): 31 ng/L — ABNORMAL HIGH (ref <18)
Troponin I (High Sensitivity): 31 ng/L — ABNORMAL HIGH (ref <18)

## 2019-08-19 MED ORDER — FENTANYL CITRATE (PF) 100 MCG/2ML IJ SOLN
50.0000 ug | INTRAMUSCULAR | Status: DC | PRN
  Administered 2019-08-19: 50 ug via NASAL
  Filled 2019-08-19: qty 2

## 2019-08-19 NOTE — Telephone Encounter (Signed)
What should I tell him to do?

## 2019-08-19 NOTE — ED Triage Notes (Signed)
Pt arrives POV for eval of back pain x 1 week. Pt reports he has been seen for same, but pain is not improving w/ meds given by ortho. Pt reports he checked his pulse today and felt that he was irregular. States he was in trigeminy or quadrigeminy which he noted through radial palpation. Pt reports that he feels his electrolytes may be imbalanced d/t his dosing of tikosyn

## 2019-08-19 NOTE — Telephone Encounter (Signed)
Even though we have seen him in the office recently, he actually had spine surgery by Dr. Sherwood Gambler who is with neurosurgery here in town.  He may want to call them and ask what he needs to do.  From our standpoint, he would likely need some type of MRI with contrast to better evaluate the spine.  Once again though, he is a patient of the neurosurgery group in town in terms of previous back surgeries and needs to hear something from that group.

## 2019-08-19 NOTE — Telephone Encounter (Signed)
Pt called in said since his appt on 08-17-2019 his back pain has gotten severely worse and is at the point of going to the hospital.  Please give pt a call to let him know what he should do.   [phone]

## 2019-08-19 NOTE — Telephone Encounter (Signed)
Patient aware sent referral to [facility] STAT

## 2019-08-20 ENCOUNTER — Emergency Department (HOSPITAL_COMMUNITY): Payer: Medicare Other

## 2019-08-20 ENCOUNTER — Encounter (HOSPITAL_COMMUNITY): Payer: Self-pay | Admitting: Radiology

## 2019-08-20 LAB — MAGNESIUM: Magnesium: 1.8 mg/dL (ref 1.7–2.4)

## 2019-08-20 MED ORDER — IOHEXOL 350 MG/ML SOLN
100.0000 mL | Freq: Once | INTRAVENOUS | Status: AC | PRN
  Administered 2019-08-20: 04:00:00 100 mL via INTRAVENOUS

## 2019-08-20 MED ORDER — KETOROLAC TROMETHAMINE 15 MG/ML IJ SOLN
15.0000 mg | Freq: Once | INTRAMUSCULAR | Status: AC
  Administered 2019-08-20: 15 mg via INTRAVENOUS
  Filled 2019-08-20: qty 1

## 2019-08-20 MED ORDER — LIDOCAINE 5 % EX PTCH: 1.0000 | MEDICATED_PATCH | CUTANEOUS | 0 refills | Status: DC

## 2019-08-20 MED ORDER — DICLOFENAC SODIUM 1 % TD GEL: 4.0000 g | Freq: Four times a day (QID) | TRANSDERMAL | 0 refills | Status: DC

## 2019-08-20 MED ORDER — LIDOCAINE 5 % EX PTCH
1.0000 | MEDICATED_PATCH | CUTANEOUS | Status: DC
  Administered 2019-08-20: 1 via TRANSDERMAL
  Filled 2019-08-20: qty 1

## 2019-08-20 NOTE — ED Notes (Signed)
Mag added on at main lab

## 2019-08-20 NOTE — ED Provider Notes (Addendum)
[facility] EMERGENCY DEPARTMENT Provider Note   CSN: [id number] Arrival date & time: 08/19/19  2008     History   Chief Complaint Chief Complaint  Patient presents with   Irregular Heart Beat   Back Pain    HPI [name] [name]. is a 68 y.o. male.     The history is provided by the patient.  Back Pain Location:  Sacro-iliac joint Quality:  Stabbing Radiates to:  Does not radiate Pain severity:  Severe Pain is:  Same all the time Onset quality:  Gradual Timing:  Constant Progression:  Unchanged Chronicity:  Recurrent Context: not emotional stress, not falling, not jumping from heights, not lifting heavy objects, not MCA, not MVA, not occupational injury, not pedestrian accident, not physical stress, not recent illness, not recent injury and not twisting   Relieved by:  Nothing Worsened by:  Nothing Ineffective treatments:  Muscle relaxants (narcotics and muscle relaxants) Associated symptoms: no abdominal pain, no abdominal swelling, no bladder incontinence, no bowel incontinence, no chest pain, no dysuria, no fever, no headaches, no leg pain, no numbness, no paresthesias, no pelvic pain, no perianal numbness, no tingling, no weakness and no weight loss   Patient with h/o back issues currently being seen by orthopedics presents with same.  They reportedly told him he needs a physiatrist and injections.  No weakness no numbness.  No leg pain.  No incontinence of urine or stool.  No f/c/r.  Also states he thinks his electrolytes are off secondary to feeling multiple PVCs on his pulse.  Denies CP, DOE, SOB, n/v/d.  No thoracic back pain no cough.    Past Medical History:  Diagnosis Date   Atrial fibrillation ([place]) 03/22/2009   a. s/p multiple DCCV; b. no coumadin due to low TE risk profile; c. Tikosyn Rx   Coronary atherosclerosis of native coronary artery 11/2002   a. s/p stent to LAD 12/03; OM2 occluded at cath 12/03; d. myoview 5/10: no ischemia;  e.  echo 7/11: EF 55%, BAE, mild RVE, PASP 41-45; Myoview was in March 2013. There was no ischemia or infarction, EF 51%    Cutaneous abscess of back excluding buttocks 07/04/2014   Appears to stem from possibly a cyst very large area 6 cm contact surgeon office    Diabetes mellitus without complication ([place])    Drusen body    see opth note   ERECTILE DYSFUNCTION 03/22/2009   GERD 03/22/2009   HYPERGLYCEMIA 04/25/2010   HYPERLIPIDEMIA 03/22/2009   Iliac aneurysm ([place])    2.6 to be evaluated incidental finding on CT   LATERAL EPICONDYLITIS, LEFT 10/24/2009   LIVER FUNCTION TESTS, ABNORMAL 04/25/2010   Local reaction to immunization 05/05/2012   minor resolving  zostavax    Myocardial infarction [facility]) [id number]   Numbness in left leg    foot related to back disease and surgery   Obesity, unspecified 04/24/2009   Perforated appendicitis with necrosis s/p open appendectomy 06/07/14 06/04/2014   Renal cyst    Characterized by MRI as simple   Ruptured suppurative appendicitis    2015    SLEEP APNEA, OBSTRUCTIVE 03/22/2009   compliant with CPAP   THROMBOCYTOPENIA 08/16/2010   TOBACCO USE, QUIT 10/24/2009   ULNAR NEUROPATHY, LEFT [id number]   Umbilical hernia     Patient Active Problem List   Diagnosis Date Noted   Acute back pain with sciatica, right 09/04/2017   Right hip pain 09/04/2017   Thrombocytopenia ([place]) 06/25/2016   Atrial fibrillation (  [place]) 01/01/2016   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   Iliac aneurysm [facility])    Morbid obesity ([place]) 07/08/2014   Type 2 diabetes mellitus with other circulatory complications ([place]) 0000000   Chronic anticoagulation 12/21/2013   Sleep disorder 06/21/2013   Decreased hearing 08/06/2012   Hemorrhoids 08/06/2012   High risk medication use 05/05/2012   Anticoagulation management encounter 05/05/2012   Retinal tear 04/29/2012   Personal history of colonic polyps 05/05/2011   Visit for preventive health examination  05/05/2011   PALPITATIONS 08/16/2010   LIVER FUNCTION TESTS, ABNORMAL 04/25/2010   Lateral epicondylitis 10/24/2009   TOBACCO USE, QUIT 10/24/2009   OBESITY, UNSPECIFIED 04/24/2009   Hyperlipidemia 03/22/2009   ERECTILE DYSFUNCTION 03/22/2009   Obstructive sleep apnea 03/22/2009   ULNAR NEUROPATHY, LEFT 03/22/2009   MYOCARDIAL INFARCTION, HX OF 03/22/2009   Coronary atherosclerosis 03/22/2009   Atrial fibrillation with RVR ([place]) 03/22/2009   GERD 03/22/2009    Past Surgical History:  Procedure Laterality Date   APPENDECTOMY N/A 06/06/2014   Procedure: APPENDECTOMY;  Surgeon: Odis Hollingshead, MD;  Location: [facility];  Service: General;  Laterality: N/A;   COLON SURGERY     COLONOSCOPY  11/15/2011   Procedure: COLONOSCOPY;  Surgeon: Juanita Craver, MD;  Location: [facility];  Service: Endoscopy;  Laterality: N/A;   COLONOSCOPY WITH PROPOFOL N/A 01/03/2017   Procedure: COLONOSCOPY WITH PROPOFOL;  Surgeon: Carol Ada, MD;  Location: [facility];  Service: Endoscopy;  Laterality: N/A;   cyst on epiglottis  08/2002   ESOPHAGOGASTRODUODENOSCOPY  11/15/2011   Procedure: ESOPHAGOGASTRODUODENOSCOPY (EGD);  Surgeon: Juanita Craver, MD;  Location: [facility];  Service: Endoscopy;  Laterality: N/A;   LAPAROSCOPIC APPENDECTOMY N/A 06/06/2014   Procedure: APPENDECTOMY LAPAROSCOPIC attemted;  Surgeon: Odis Hollingshead, MD;  Location: [facility];  Service: General;  Laterality: N/A;   LUMBAR DISC SURGERY     two  holes in spinalcovering   stent     LAD DUS 2004   surgery l4-l5   1998   ruptured x 3   ulnar neuropathy     UMBILICAL HERNIA REPAIR     mesh        Home Medications    Prior to Admission medications   Medication Sig Start Date End Date Taking? Authorizing Provider  acetaminophen (TYLENOL) 325 MG tablet Take 650 mg by mouth every 6 (six) hours as needed for mild pain.    [provider]  albuterol (PROVENTIL HFA;VENTOLIN HFA) 108 (90 Base) MCG/ACT  inhaler Inhale 2 puffs into the lungs every 6 (six) hours as needed for wheezing or shortness of breath. 03/24/17   Panosh, Standley Brooking, MD  ALPHAGAN P 0.1 % SOLN INSTILL 1 DROP INTO RIGHT EYE 3 TIMES A DAY 07/20/18   [provider]  atorvastatin (LIPITOR) 80 MG tablet TAKE 1 TABLET BY MOUTH  DAILY 03/11/19   Panosh, Standley Brooking, MD  cyclobenzaprine (FLEXERIL) 10 MG tablet Take 1 tablet (10 mg total) by mouth 3 (three) times daily as needed for muscle spasms. 08/17/19   Pete Pelt, PA-C  diphenhydrAMINE (BENADRYL) 25 MG tablet Take 25 mg by mouth at bedtime as needed for sleep.    [provider]  dofetilide (TIKOSYN) 500 MCG capsule Take 1 capsule (500 mcg total) by mouth 2 (two) times daily. 07/05/19   Lelon Perla, MD  doxycycline (VIBRAMYCIN) 100 MG capsule Take 1 capsule (100 mg total) by mouth 2 (two) times daily. 08/06/19   Panosh, Standley Brooking, MD  famotidine (PEPCID) 10 MG tablet Take 10 mg by mouth as needed for heartburn.     [provider]  fexofenadine-pseudoephedrine (ALLEGRA-D) 60-120 MG 12 hr tablet Take 1 tablet by mouth 2 (two) times daily.    [provider]  fish oil-omega-3 fatty acids 1000 MG capsule Take 1 g by mouth 2 (two) times daily.     [provider]  fluticasone (FLONASE) 50 MCG/ACT nasal spray Place 2 sprays into both nostrils daily as needed for allergies. 05/05/19   Panosh, Standley Brooking, MD  fluticasone furoate-vilanterol (BREO ELLIPTA) 200-25 MCG/INH AEPB Inhale 1 puff into the lungs daily.    [provider]  HYDROcodone-acetaminophen (NORCO) 5-325 MG tablet Take 1 tablet by mouth every 4 (four) hours as needed for moderate pain. One to two tabs every 4-6 hours for pain 08/17/19   Pete Pelt, PA-C  KLOR-CON M20 20 MEQ tablet TAKE 1 TABLET BY MOUTH EVERY DAY 06/03/19   Lelon Perla, MD  Magnesium Oxide 500 MG TABS Take 750 mg by mouth daily.    [provider]  metFORMIN (GLUCOPHAGE-XR) 500 MG 24 hr tablet Take  as directed 07/21/19   Panosh, Standley Brooking, MD  MULTIPLE VITAMIN PO Take 1 tablet by mouth every evening.     [provider]  nitroGLYCERIN (NITROSTAT) 0.4 MG SL tablet Place 1 tablet (0.4 mg total) under the tongue every 5 (five) minutes as needed for chest pain. 02/01/19   Lelon Perla, MD  ondansetron (ZOFRAN ODT) 4 MG disintegrating tablet Take 1 tablet (4 mg total) by mouth every 8 (eight) hours as needed for nausea or vomiting. 09/02/18   Panosh, Standley Brooking, MD  ONETOUCH VERIO test strip USE TWICE A DAY 02/03/19   Panosh, Standley Brooking, MD  PRADAXA 150 MG CAPS capsule TAKE 1 CAPSULE BY MOUTH  EVERY 12 HOURS 03/11/19   Panosh, Standley Brooking, MD  Suvorexant (BELSOMRA) 20 MG TABS Take 1 tablet by mouth at bedtime. 04/27/19   Panosh, Standley Brooking, MD  Suvorexant (BELSOMRA) 20 MG TABS Take 1 tablet by mouth at bedtime. 05/05/19   Panosh, Standley Brooking, MD  tamsulosin (FLOMAX) 0.4 MG CAPS capsule TAKE 1 CAPSULE BY MOUTH EVERY DAY 08/21/18   Panosh, Standley Brooking, MD  triamcinolone cream (KENALOG) 0.1 % Apply 1 application topically 2 (two) times daily. As needed. 02/12/19   Panosh, Standley Brooking, MD    Family History Family History  Problem Relation Age of Onset   Thyroid disease Mother    Ovarian cancer Mother    Breast cancer Mother    Lung cancer Father    Cancer Father     Social History Social History   Tobacco Use   Smoking status: Former Smoker    Packs/day: 2.00    Years: 42.00    Pack years: 84.00    Types: Cigarettes    Quit date: 12/30/2008    Years since quitting: 10.6   Smokeless tobacco: Never Used   Tobacco comment: started at age 76; 1-2 ppd; quit 2010  Substance Use Topics   Alcohol use: No    Alcohol/week: 0.0 standard drinks    Comment: rarely; maybe 1 beer a year   Drug use: No     Allergies   Penicillins, Procaine hcl, Pseudoephedrine, Sulfonamide derivatives, Cardizem [diltiazem], and Pseudoephedrine hcl   Review of Systems Review of Systems  Constitutional: Negative for fever  and weight loss.  HENT: Negative for congestion.   Eyes: Negative for visual  disturbance.  Respiratory: Negative for chest tightness and shortness of breath.   Cardiovascular: Negative for chest pain.  Gastrointestinal: Negative for abdominal pain and bowel incontinence.  Genitourinary: Negative for bladder incontinence, difficulty urinating, dysuria, flank pain, frequency, hematuria and pelvic pain.  Musculoskeletal: Positive for back pain. Negative for gait problem, joint swelling and neck pain.  Skin: Negative for rash.  Neurological: Negative for tingling, weakness, numbness, headaches and paresthesias.  Psychiatric/Behavioral: Negative for agitation.  All other systems reviewed and are negative.    Physical Exam Updated Vital Signs BP 123/77    Pulse 62    Temp 98.3 F (36.8 C) (Oral)    Resp 18    Ht 6\' 1"  (1.854 m)    Wt 120.2 kg    SpO2 92%    BMI 34.96 kg/m   Physical Exam Vitals signs and nursing note reviewed.  Constitutional:      General: He is not in acute distress.    Appearance: He is normal weight.  HENT:     Head: Normocephalic and atraumatic.     Nose: Nose normal.     Mouth/Throat:     Mouth: Mucous membranes are moist.     Pharynx: Oropharynx is clear.  Eyes:     Conjunctiva/sclera: Conjunctivae normal.     Pupils: Pupils are equal, round, and reactive to light.  Neck:     Musculoskeletal: Normal range of motion and neck supple.  Cardiovascular:     Rate and Rhythm: Normal rate and regular rhythm.     Pulses: Normal pulses.     Heart sounds: Normal heart sounds.  Pulmonary:     Effort: Pulmonary effort is normal. No respiratory distress.     Breath sounds: Normal breath sounds. No wheezing or rales.  Abdominal:     General: Abdomen is flat. Bowel sounds are normal.     Tenderness: There is no abdominal tenderness. There is no guarding or rebound.  Musculoskeletal: Normal range of motion.        General: No tenderness.     Right hip: Normal.      Cervical back: Normal.     Thoracic back: Normal.     Lumbar back: Normal.     Right lower leg: No edema.     Left lower leg: No edema.     Comments: L5/s1 intact perineal sensation    Skin:    General: Skin is warm and dry.     Capillary Refill: Capillary refill takes less than 2 seconds.  Neurological:     General: No focal deficit present.     Mental Status: He is alert and oriented to person, place, and time.     Deep Tendon Reflexes: Reflexes normal.  Psychiatric:        Mood and Affect: Mood normal.        Behavior: Behavior normal.      ED Treatments / Results  Labs (all labs ordered are listed, but only abnormal results are displayed) Labs Reviewed  BASIC METABOLIC PANEL - Abnormal; Notable for the following components:      Result Value   Sodium 134 (*)    CO2 20 (*)    Glucose, Bld 121 (*)    All other components within normal limits  CBC - Abnormal; Notable for the following components:   WBC 13.5 (*)    RBC 5.89 (*)    Hemoglobin 18.3 (*)    HCT 53.2 (*)    Platelets 145 (*)  All other components within normal limits  TROPONIN I (HIGH SENSITIVITY) - Abnormal; Notable for the following components:   Troponin I (High Sensitivity) 31 (*)    All other components within normal limits  TROPONIN I (HIGH SENSITIVITY) - Abnormal; Notable for the following components:   Troponin I (High Sensitivity) 31 (*)    All other components within normal limits  MAGNESIUM    EKG EKG Interpretation  Date/Time:  Thursday August 19 2019 20:31:05 EDT Ventricular Rate:  84 PR Interval:  174 QRS Duration: 96 QT Interval:  386 QTC Calculation: 456 R Axis:   165 Text Interpretation:  Normal sinus rhythm RSR' or QR pattern in V1 suggests right ventricular conduction delay Confirmed by Randal Buba, Aala Ransom ([id number]) on 08/20/2019 2:02:46 AM   Radiology Dg Chest 2 View  Result Date: 08/19/2019 CLINICAL DATA:  Chest pain EXAM: CHEST - 2 VIEW COMPARISON:  Bunny Lowdermilk 21, 2019 FINDINGS: The  heart size is enlarged but grossly stable when compared to prior study. Aortic calcifications are noted. There is no pneumothorax. No infiltrate. Again noted are findings suggestive of underlying interstitial lung disease, better appreciated on prior CT from 02/03/2019. There is no significant pleural effusion. There is no acute osseous abnormality. IMPRESSION: No active cardiopulmonary disease. Electronically Signed   By: Constance Holster M.D.   On: 08/19/2019 21:04   Ct Angio Chest/abd/pel For Dissection W And/or Wo Contrast  Result Date: 08/20/2019 CLINICAL DATA:  Chest pain extending to the back for 1 week. Arrhythmia. Known right iliac artery aneurysm. Known ascending thoracic aortic aneurysm. Initial encounter EXAM: CT ANGIOGRAPHY CHEST, ABDOMEN AND PELVIS TECHNIQUE: Multidetector CT imaging through the chest, abdomen and pelvis was performed using the standard protocol during bolus administration of intravenous contrast. Multiplanar reconstructed images and MIPs were obtained and reviewed to evaluate the vascular anatomy. CONTRAST:  113mL OMNIPAQUE IOHEXOL 350 MG/ML SOLN COMPARISON:  CT chest 02/03/2019 CT of the abdomen pelvis 07/30/2018 FINDINGS: CTA CHEST FINDINGS Cardiovascular: The heart size is normal. Dense coronary artery calcifications are again noted. Atherosclerotic calcifications are present at the aortic arch. No displaced calcifications are present The main pulmonary artery outflow tract remains dilated at 4.6 cm. The ascending aorta measures up to 4.1 cm. Atherosclerotic calcifications are present at the origin of the left subclavian artery without significant stenosis. Distal arch calcifications are present. There is no aneurysm or evidence for dissection within the descending aorta. Mediastinum/Nodes: Subcentimeter mediastinal lymph nodes are similar the prior exam, likely reactive. Largest node is a stable 8 mm right paratracheal node. No new nodes are present. No significant axillary  mediastinal adenopathy is present. Lungs/Pleura: Mild-to-moderate centrilobular and paraseptal emphysema is again noted. There is mild diffuse bronchial wall thickening. Subpleural nodule in the right middle lobe is stable, measuring 8 x 5 mm. No new nodules are present. Patchy ground-glass opacities are again seen. There is no distinct honeycombing. No focal airspace consolidation is present. No new nodule or mass lesion is present. Musculoskeletal: 12 rib-bearing thoracic type vertebral bodies are present. Degenerative changes are noted in the lower cervical spine. No acute or healing fractures are present. Ribs are unremarkable. No focal lytic or blastic lesions are present. Review of the MIP images confirms the above findings. CTA ABDOMEN AND PELVIS FINDINGS VASCULAR Aorta: Normal caliber aorta without aneurysm, dissection, vasculitis or significant stenosis. Atherosclerotic calcifications are present particularly in the infrarenal aorta. Celiac: Patent without evidence of aneurysm, dissection, vasculitis or significant stenosis. SMA: Minimal atherosclerotic calcifications are present at the origin. There is  no significant stenosis. Branch vessels are within normal limits. Renals: Duplicated right renal artery is present. Atherosclerotic changes are noted at the ostia of both renal arteries without significant stenosis. There is no aneurysm or vessel irregularity. No FMD. IMA: Patent without evidence of aneurysm, dissection, vasculitis or significant stenosis. Inflow: Aneurysmal dilation of the right common iliac artery measures up to 2.1 cm, not significantly changed. No dissection. No significant vasculitis. More mild dilation of the left common iliac artery is noted. Dense calcifications are present bilaterally without significant stenosis. Veins: No obvious venous abnormality within the limitations of this arterial phase study. Review of the MIP images confirms the above findings. NON-VASCULAR Hepatobiliary:  Peripherally calcified 2 cm gallstone is again seen. No focal hepatic lesions are present. Gallbladder is otherwise unremarkable. Common bile duct is normal. Pancreas: Unremarkable. No pancreatic ductal dilatation or surrounding inflammatory changes. Spleen: Normal in size without focal abnormality. Adrenals/Urinary Tract: The adrenal glands are normal bilaterally. 5.4 cm right lower pole exophytic cyst demonstrate some interval increase in size. More anterior upper pole cyst measures 2 point 1 cm, also slightly increased. Nonobstructing punctate stone is present at the lower pole of the right kidney. Left kidney is unremarkable. Ureter is normal. The urinary bladder is within normal limits. Stomach/Bowel: The stomach and duodenum are within normal limits. Small bowel is unremarkable. Terminal ileum is within normal limits. The ascending and transverse colon are within normal limits. Diverticular changes are present in the descending and sigmoid colon without inflammatory changes to suggest diverticulitis. Lymphatic: No significant retroperitoneal adenopathy is present. Reproductive: Prostate is mildly enlarged. Other: No abdominal wall hernia or abnormality. No abdominopelvic ascites. Musculoskeletal: Degenerative changes of the lumbar spine are most pronounced at L4-5 L5-S1. Vertebral body heights are maintained. No acute or healing fractures are present. Pelvis is within normal limits. The hips are located and within normal limits. Review of the MIP images confirms the above findings. IMPRESSION: 1. No acute vascular abnormality to explain the patient's chest pain. No dissection. 2. Ascending thoracic aortic aneurysm measures 4.1 cm maximally on today's study. Recommend semi-annual imaging followup by CTA or MRA and referral to cardiothoracic surgery if not already obtained. This recommendation follows 2010 ACCF/AHA/AATS/ACR/ASA/SCA/SCAI/SIR/STS/SVM Guidelines for the Diagnosis and Management of Patients With  Thoracic Aortic Disease. Circulation. 2010; [id number]. Aortic aneurysm NOS (ICD10-I71.9) 3. Stable dilation main pulmonary outflow tract consistent with pulmonary arterial hypertension. 4. 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 5. Coronary artery disease. 6. Atherosclerotic changes in the abdominal aorta as described without aneurysm or dissection. 7. Stable aneurysmal dilation of the common iliac arteries, right greater than left. 8. Atherosclerotic changes at the origins of the superior mesenteric artery and bilateral renal arteries without significant stenosis. 9. Slight enlargement of right-sided renal cysts. 10. Punctate nonobstructing stone at the lower pole of the right kidney. 11. Degenerative changes in the thoracic and lumbar spine without acute abnormality. Electronically Signed   By: San Morelle M.D.   On: 08/20/2019 04:44    Procedures Procedures (including critical care time)  Medications Ordered in ED Medications  lidocaine (LIDODERM) 5 % 1 patch (1 patch Transdermal Patch Applied 08/20/19 0349)  ketorolac (TORADOL) 15 MG/ML injection 15 mg (15 mg Intravenous Given 08/20/19 0349)  iohexol (OMNIPAQUE) 350 MG/ML injection 100 mL (100 mLs Intravenous Contrast Given 08/20/19 0407)     Patient informed of thoracic aneurysm.  He states he follows with doctor Scot Dock and will call him.  He has placed a call to  Dr. Sherwood Gambler regarding his ongoing back issues.  His electrolytes are normal.  He denies ever having chest pain or shortness or breath.    Spoke with cardiology PA via phone no indication for admission, they will get him follow up in the clinic.    [name] [name]. was evaluated in Emergency Department on 08/20/2019 for the symptoms described in the history of present illness. He was evaluated in the context of the global COVID-19 pandemic, which necessitated consideration that the patient might be at risk for infection with the  SARS-CoV-2 virus that causes COVID-19. Institutional protocols and algorithms that pertain to the evaluation of patients at risk for COVID-19 are in a state of rapid change based on information released by regulatory bodies including the CDC and federal and state organizations. These policies and algorithms were followed during the patient's care in the ED.  Final Clinical Impressions(s) / ED Diagnoses   Final diagnoses:  PVC's (premature ventricular contractions)  FH: thoracic aortic aneurysm  Iliac aneurysm ([place])  Sacral pain    Return for intractable cough, coughing up blood,fevers >100.4 unrelieved by medication, shortness of breath, intractable vomiting, chest pain, shortness of breath, weakness,numbness, changes in speech, facial asymmetry,abdominal pain, passing out,Inability to tolerate liquids or food, cough, altered mental status or any concerns. No signs of systemic illness or infection. The patient is nontoxic-appearing on exam and vital signs are within normal limits.   I have reviewed the triage vital signs and the nursing notes. Pertinent labs &imaging results that were available during my care of the patient were reviewed by me and considered in my medical decision making (see chart for details).  After history, exam, and medical workup I feel the patient has been appropriately medically screened and is safe for discharge home. Pertinent diagnoses were discussed with the patient. Patient was given return precautions      Deerica Waszak, MD 08/20/19 [place], Brittannie Tawney, MD 08/20/19 [id number]

## 2019-08-24 ENCOUNTER — Other Ambulatory Visit: Payer: Self-pay

## 2019-08-24 ENCOUNTER — Telehealth (INDEPENDENT_AMBULATORY_CARE_PROVIDER_SITE_OTHER): Payer: Medicare Other | Admitting: Internal Medicine

## 2019-08-24 ENCOUNTER — Encounter: Payer: Self-pay | Admitting: Internal Medicine

## 2019-08-24 DIAGNOSIS — M4800 Spinal stenosis, site unspecified: Secondary | ICD-10-CM | POA: Diagnosis not present

## 2019-08-24 DIAGNOSIS — M545 Low back pain, unspecified: Secondary | ICD-10-CM

## 2019-08-24 DIAGNOSIS — Z79899 Other long term (current) drug therapy: Secondary | ICD-10-CM

## 2019-08-24 DIAGNOSIS — K5909 Other constipation: Secondary | ICD-10-CM

## 2019-08-24 DIAGNOSIS — E1159 Type 2 diabetes mellitus with other circulatory complications: Secondary | ICD-10-CM | POA: Diagnosis not present

## 2019-08-24 MED ORDER — PREDNISONE 20 MG PO TABS: ORAL_TABLET | ORAL | 0 refills | Status: DC

## 2019-08-24 MED ORDER — OXYCODONE-ACETAMINOPHEN 5-325 MG PO TABS: 1.0000 | ORAL_TABLET | ORAL | 0 refills | Status: DC | PRN

## 2019-08-24 NOTE — Progress Notes (Signed)
Virtual Visit via Video Note  I connected with@ on 08/24/19 at  3:30 PM EDT by a video enabled telemedicine application and verified that I am speaking with the correct person using two identifiers. Location patient: home Location provider:work  office Persons participating in the virtual visit: patient, provider  WIth national recommendations  regarding COVID 19 pandemic   video visit is advised over in office visit for this patient.  Patient aware  of the limitations of evaluation and management by telemedicine and  availability of in person appointments. and agreed to proceed.   HPI: [name] [name]. presents for video visit becauase of unrelenting back pain  hreflared about august 14 ( having jsut finished a steroid rx for his ischemic optic lef t eyr rx via  [facility]  With no loss of vision)    Having increasing pain to 10/10 till needed to seek ed care  And seen 8 20 . Pain was throbbing with heart beat and had evaluation that included abd chest ct and aneurysms stable  .  He has had known  Sever spinal stenosis and has seen dr [name] and in past had injections per dr Ernestina Patches that helped  Until now  No sciatic or weakness but severe pain   No bm ofr about a week. Was given lidocaine and voltaren gel in ed  And prev dr [name] some oxycodone  Small amount that helped him get through  [facility]  Has referred him to dr Sherwood Gambler for his back predicament and has appt in a week. Asking for poss steroid rx to see if helps in interm    He has lost weight over the past  10 days from pain but no fever  New ss   ct scan chest abd and noted  Thoracic non active thoracic aaa and no acute findings   pulm disease and   To fu with  Vascular surgery  No discection  ROS: See pertinent positives and negatives per HPI. No new sob fever  Bleeding has increased his metofrmin from last visit   Past Medical History:  Diagnosis Date  . Atrial fibrillation ([place]) 03/22/2009   a. s/p multiple DCCV;  b. no coumadin due to low TE risk profile; c. Tikosyn Rx  . Coronary atherosclerosis of native coronary artery 11/2002   a. s/p stent to LAD 12/03; OM2 occluded at cath 12/03; d. myoview 5/10: no ischemia;  e. echo 7/11: EF 55%, BAE, mild RVE, PASP 41-45; Myoview was in March 2013. There was no ischemia or infarction, EF 51%   . Cutaneous abscess of back excluding buttocks 07/04/2014   Appears to stem from possibly a cyst very large area 6 cm contact surgeon office   . Diabetes mellitus without complication ([place])   . Drusen body    see opth note  . ERECTILE DYSFUNCTION 03/22/2009  . GERD 03/22/2009  . HYPERGLYCEMIA 04/25/2010  . HYPERLIPIDEMIA 03/22/2009  . 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 LATERAL EPICONDYLITIS, LEFT 10/24/2009  . LIVER FUNCTION TESTS, ABNORMAL 04/25/2010  . Local reaction to immunization 05/05/2012   minor resolving  zostavax   . Myocardial infarction ([place]) mi2003  . Numbness in left leg    foot related to back disease and surgery  . Obesity, unspecified 04/24/2009  . Perforated appendicitis with necrosis s/p open appendectomy 06/07/14 06/04/2014  . Renal cyst    Characterized by MRI as simple  . Ruptured suppurative appendicitis  2015   . SLEEP APNEA, OBSTRUCTIVE 03/22/2009   compliant with CPAP  . THROMBOCYTOPENIA 08/16/2010  . TOBACCO USE, QUIT 10/24/2009  . 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 Umbilical hernia     Past Surgical History:  Procedure Laterality Date  . APPENDECTOMY N/A 06/06/2014   Procedure: APPENDECTOMY;  Surgeon: Odis Hollingshead, MD;  [facility];  Service: General;  Laterality: N/A;  . COLON SURGERY    . COLONOSCOPY  11/15/2011   Procedure: COLONOSCOPY;  Surgeon: Juanita Craver, MD;  [facility];  Service: Endoscopy;  Laterality: N/A;  . COLONOSCOPY WITH PROPOFOL N/A 01/03/2017   Procedure: COLONOSCOPY WITH PROPOFOL;  Surgeon: Carol Ada, MD;  [facility];  Service: Endoscopy;   Laterality: N/A;  . cyst on epiglottis  08/2002  . ESOPHAGOGASTRODUODENOSCOPY  11/15/2011   Procedure: ESOPHAGOGASTRODUODENOSCOPY (EGD);  Surgeon: Juanita Craver, MD;  [facility];  Service: Endoscopy;  Laterality: N/A;  . LAPAROSCOPIC APPENDECTOMY N/A 06/06/2014   Procedure: APPENDECTOMY LAPAROSCOPIC attemted;  Surgeon: Odis Hollingshead, MD;  [facility];  Service: General;  Laterality: N/A;  . LUMBAR DISC SURGERY     two  holes in spinalcovering  . stent     LAD DUS 2004  . surgery l4-l5   1998   ruptured x 3  . ulnar neuropathy    . UMBILICAL HERNIA REPAIR     mesh    Family History  Problem Relation Age of Onset  . Thyroid disease Mother   . Ovarian cancer Mother   . Breast cancer Mother   . Lung cancer Father   . Cancer Father     Social History   Tobacco Use  . Smoking status: Former Smoker    Packs/day: 2.00    Years: 42.00    Pack years: 84.00    Types: Cigarettes    Quit date: 12/30/2008    Years since quitting: 10.6  . Smokeless tobacco: Never Used  . Tobacco comment: started at age 24; 1-2 ppd; quit 2010  Substance Use Topics  . Alcohol use: No    Alcohol/week: 0.0 standard drinks    Comment: rarely; maybe 1 beer a year  . Drug use: No      Current Outpatient Medications:  .  acetaminophen (TYLENOL) 325 MG tablet, Take 650 mg by mouth every 6 (six) hours as needed for mild pain., Disp: , Rfl:  .  albuterol (PROVENTIL HFA;VENTOLIN HFA) 108 (90 Base) MCG/ACT inhaler, Inhale 2 puffs into the lungs every 6 (six) hours as needed for wheezing or shortness of breath., Disp: 1 Inhaler, Rfl: 0 .  ALPHAGAN P 0.1 % SOLN, Place 1 drop into both eyes 3 (three) times daily. , Disp: , Rfl: 3 .  atorvastatin (LIPITOR) 80 MG tablet, TAKE 1 TABLET BY MOUTH  DAILY (Patient taking differently: Take 80 mg by mouth at bedtime. ), Disp: 90 tablet, Rfl: 1 .  cyclobenzaprine (FLEXERIL) 10 MG tablet, Take 1 tablet (10 mg total) by mouth 3 (three) times daily as needed  for muscle spasms., Disp: 40 tablet, Rfl: 0 .  diclofenac sodium (VOLTAREN) 1 % GEL, Apply 4 g topically 4 (four) times daily., Disp: 100 g, Rfl: 0 .  diphenhydrAMINE (BENADRYL) 25 MG tablet, Take 25 mg by mouth at bedtime as needed for sleep., Disp: , Rfl:  .  dofetilide (TIKOSYN) 500 MCG capsule, Take 1 capsule (500 mcg total) by mouth 2 (two) times daily., Disp: 180 capsule, Rfl: 2 .  famotidine (PEPCID) 10  MG tablet, Take 10 mg by mouth as needed for heartburn. , Disp: , Rfl:  .  fexofenadine (ALLEGRA) 180 MG tablet, Take 180 mg by mouth daily., Disp: , Rfl:  .  fish oil-omega-3 fatty acids 1000 MG capsule, Take 1 g by mouth 2 (two) times daily. , Disp: , Rfl:  .  fluticasone (FLONASE) 50 MCG/ACT nasal spray, Place 2 sprays into both nostrils daily as needed for allergies., Disp: 48 g, Rfl: prn .  fluticasone furoate-vilanterol (BREO ELLIPTA) 200-25 MCG/INH AEPB, Inhale 1 puff into the lungs daily as needed (shortness of breath). , Disp: , Rfl:  .  HYDROcodone-acetaminophen (NORCO) 5-325 MG tablet, Take 1 tablet by mouth every 4 (four) hours as needed for moderate pain. One to two tabs every 4-6 hours for pain, Disp: 20 tablet, Rfl: 0 .  KLOR-CON M20 20 MEQ tablet, TAKE 1 TABLET BY MOUTH EVERY DAY (Patient taking differently: Take 20 mEq by mouth daily. ), Disp: 90 tablet, Rfl: 3 .  lidocaine (LIDODERM) 5 %, Place 1 patch onto the skin daily. Remove & Discard patch within 12 hours or as directed by MD, Disp: 30 patch, Rfl: 0 .  Magnesium (V-R MAGNESIUM) 250 MG TABS, Take 250 mg by mouth at bedtime. Take with 500mg  tablet to equal 750mg  , Disp: , Rfl:  .  Magnesium Oxide 500 MG TABS, Take 500 mg by mouth at bedtime. Take with 250mg  tablet to equal 750mg , Disp: , Rfl:  .  metFORMIN (GLUCOPHAGE-XR) 500 MG 24 hr tablet, Take as directed (Patient taking differently: Take 500 mg by mouth 3 (three) times daily. ), Disp: 270 tablet, Rfl: 3 .  MULTIPLE VITAMIN PO, Take 1 tablet by mouth every evening. ,  Disp: , Rfl:  .  nitroGLYCERIN (NITROSTAT) 0.4 MG SL tablet, Place 1 tablet (0.4 mg total) under the tongue every 5 (five) minutes as needed for chest pain., Disp: 25 tablet, Rfl: 12 .  ondansetron (ZOFRAN ODT) 4 MG disintegrating tablet, Take 1 tablet (4 mg total) by mouth every 8 (eight) hours as needed for nausea or vomiting., Disp: 24 tablet, Rfl: 0 .  ONETOUCH VERIO test strip, USE TWICE A DAY, Disp: 50 each, Rfl: 12 .  oxyCODONE-acetaminophen (PERCOCET/ROXICET) 5-325 MG tablet, Take 1 tablet by mouth every 4 (four) hours as needed for severe pain., Disp: 6 tablet, Rfl: 0 .  pantoprazole (PROTONIX) 40 MG tablet, Take 40 mg by mouth daily., Disp: , Rfl:  .  PRADAXA 150 MG CAPS capsule, TAKE 1 CAPSULE BY MOUTH  EVERY 12 HOURS (Patient taking differently: Take 150 mg by mouth 2 (two) times daily. ), Disp: 180 capsule, Rfl: 1 .  predniSONE (DELTASONE) 20 MG tablet, Take 3,3,3,2,2,2,1,1,1, 1/.2 1./2 1/.2 pills po qd taper, Disp: 24 tablet, Rfl: 0 .  Suvorexant (BELSOMRA) 20 MG TABS, Take 1 tablet by mouth at bedtime., Disp: 90 tablet, Rfl: 1 .  tamsulosin (FLOMAX) 0.4 MG CAPS capsule, TAKE 1 CAPSULE BY MOUTH EVERY DAY (Patient taking differently: Take 0.4 mg by mouth daily as needed (repeat kidney stone). ), Disp: 30 capsule, Rfl: 5 .  triamcinolone cream (KENALOG) 0.1 %, Apply 1 application topically 2 (two) times daily. As needed. (Patient taking differently: Apply 1 application topically 2 (two) times daily as needed (rash). ), Disp: 30 g, Rfl: 0  Current Facility-Administered Medications:  .  ipratropium-albuterol (DUONEB) 0.5-2.5 (3) MG/3ML nebulizer solution 3 mL, 3 mL, Nebulization, Q6H, , Standley Brooking, MD, 3 mL at 01/02/18 1545  EXAM: BP Readings  from Last 3 Encounters:  08/20/19 120/69  07/21/19 138/72  02/18/19 124/70    VITALS per patient if applicable:  GENERAL: alert, oriented, appears well  Laying in bed  In mod pain but nl speech for hiim and hx   HEENT: atraumatic,  conjunttiva clear, no obvious abnormalities on inspection of external nose and ears NECK: normal movements of the head and neck LUNGS: on inspection no signs of respiratory distress, breathing rate appears normal, no obvious gross SOB, gasping or wheezing CV: no obvious cyanosis SYCH/NEURO: pleasant and cooperative, no obvious depression or anxiety, speech and thought processing grossly intact Lab Results  Component Value Date   WBC 13.5 (H) 08/19/2019   HGB 18.3 (H) 08/19/2019   HCT 53.2 (H) 08/19/2019   PLT 145 (L) 08/19/2019   GLUCOSE 121 (H) 08/19/2019   CHOL 85 02/08/2019   TRIG 110.0 02/08/2019   HDL 30.00 (L) 02/08/2019   LDLCALC 33 02/08/2019   ALT 48 02/08/2019   AST 30 02/08/2019   NA 134 (L) 08/19/2019   K 4.3 08/19/2019   CL 101 08/19/2019   CREATININE 1.10 08/19/2019   BUN 19 08/19/2019   CO2 20 (L) 08/19/2019   TSH 1.91 06/25/2018   PSA 1.12 07/21/2019   INR 1.31 12/16/2016   HGBA1C 6.7 (H) 07/21/2019   MICROALBUR 18.7 (H) 02/08/2019    ASSESSMENT AND PLAN:  Discussed the following assessment and plan:    ICD-10-CM   1. Severe low back pain  M54.5   2. Spinal stenosis, unspecified spinal region  M48.00   3. Type 2 diabetes mellitus with other circulatory complications (HCC)  123456   4. Medication management  Z79.899    Back seems the most distressing today    Risk benefit pt aware of pred and meds  Send in 12 day taper in case helps and small amount of oxycodone  Counseled.   Expectant management and discussion of plan and treatment with opportunity to ask questions and all were answered. The patient agreed with the plan and demonstrated an understanding of the instructions.  stable aneurysm to be followed by   Vascular  Advised to call back or seek an in-person evaluation if worsening  or having  further concerns . In interim      Shanon Ace, MD

## 2019-08-27 ENCOUNTER — Encounter (HOSPITAL_COMMUNITY): Payer: Self-pay | Admitting: Internal Medicine

## 2019-08-27 ENCOUNTER — Inpatient Hospital Stay (HOSPITAL_COMMUNITY): Payer: Medicare Other

## 2019-08-27 ENCOUNTER — Other Ambulatory Visit: Payer: Self-pay | Admitting: Internal Medicine

## 2019-08-27 ENCOUNTER — Ambulatory Visit: Payer: Medicare Other | Admitting: Cardiology

## 2019-08-27 ENCOUNTER — Inpatient Hospital Stay (HOSPITAL_COMMUNITY)
Admission: EM | Admit: 2019-08-27 | Discharge: 2019-09-15 | DRG: 511 | Disposition: A | Discharge status: Rehabilitation Facility | Payer: Medicare Other | Attending: Infectious Disease | Admitting: Infectious Disease

## 2019-08-27 ENCOUNTER — Other Ambulatory Visit: Payer: Self-pay

## 2019-08-27 DIAGNOSIS — R7881 Bacteremia: Secondary | ICD-10-CM | POA: Diagnosis present

## 2019-08-27 DIAGNOSIS — I712 Thoracic aortic aneurysm, without rupture: Secondary | ICD-10-CM | POA: Diagnosis present

## 2019-08-27 DIAGNOSIS — Z7901 Long term (current) use of anticoagulants: Secondary | ICD-10-CM

## 2019-08-27 DIAGNOSIS — E876 Hypokalemia: Secondary | ICD-10-CM | POA: Diagnosis not present

## 2019-08-27 DIAGNOSIS — E119 Type 2 diabetes mellitus without complications: Secondary | ICD-10-CM | POA: Diagnosis not present

## 2019-08-27 DIAGNOSIS — E785 Hyperlipidemia, unspecified: Secondary | ICD-10-CM | POA: Diagnosis present

## 2019-08-27 DIAGNOSIS — R262 Difficulty in walking, not elsewhere classified: Secondary | ICD-10-CM | POA: Diagnosis present

## 2019-08-27 DIAGNOSIS — N179 Acute kidney failure, unspecified: Secondary | ICD-10-CM | POA: Diagnosis present

## 2019-08-27 DIAGNOSIS — R634 Abnormal weight loss: Secondary | ICD-10-CM | POA: Diagnosis not present

## 2019-08-27 DIAGNOSIS — E7800 Pure hypercholesterolemia, unspecified: Secondary | ICD-10-CM | POA: Diagnosis not present

## 2019-08-27 DIAGNOSIS — G4733 Obstructive sleep apnea (adult) (pediatric): Secondary | ICD-10-CM | POA: Diagnosis present

## 2019-08-27 DIAGNOSIS — I4891 Unspecified atrial fibrillation: Secondary | ICD-10-CM | POA: Diagnosis present

## 2019-08-27 DIAGNOSIS — Z881 Allergy status to other antibiotic agents status: Secondary | ICD-10-CM

## 2019-08-27 DIAGNOSIS — M19012 Primary osteoarthritis, left shoulder: Secondary | ICD-10-CM | POA: Diagnosis present

## 2019-08-27 DIAGNOSIS — I2510 Atherosclerotic heart disease of native coronary artery without angina pectoris: Secondary | ICD-10-CM | POA: Diagnosis present

## 2019-08-27 DIAGNOSIS — Z803 Family history of malignant neoplasm of breast: Secondary | ICD-10-CM

## 2019-08-27 DIAGNOSIS — M4640 Discitis, unspecified, site unspecified: Secondary | ICD-10-CM | POA: Diagnosis not present

## 2019-08-27 DIAGNOSIS — R29818 Other symptoms and signs involving the nervous system: Secondary | ICD-10-CM

## 2019-08-27 DIAGNOSIS — G959 Disease of spinal cord, unspecified: Secondary | ICD-10-CM | POA: Diagnosis present

## 2019-08-27 DIAGNOSIS — Z955 Presence of coronary angioplasty implant and graft: Secondary | ICD-10-CM | POA: Diagnosis not present

## 2019-08-27 DIAGNOSIS — I6930 Unspecified sequelae of cerebral infarction: Secondary | ICD-10-CM | POA: Diagnosis not present

## 2019-08-27 DIAGNOSIS — Z888 Allergy status to other drugs, medicaments and biological substances status: Secondary | ICD-10-CM

## 2019-08-27 DIAGNOSIS — E1169 Type 2 diabetes mellitus with other specified complication: Secondary | ICD-10-CM | POA: Diagnosis present

## 2019-08-27 DIAGNOSIS — E669 Obesity, unspecified: Secondary | ICD-10-CM | POA: Diagnosis present

## 2019-08-27 DIAGNOSIS — M545 Low back pain, unspecified: Secondary | ICD-10-CM

## 2019-08-27 DIAGNOSIS — Z8669 Personal history of other diseases of the nervous system and sense organs: Secondary | ICD-10-CM

## 2019-08-27 DIAGNOSIS — Z6834 Body mass index (BMI) 34.0-34.9, adult: Secondary | ICD-10-CM | POA: Diagnosis not present

## 2019-08-27 DIAGNOSIS — Z9889 Other specified postprocedural states: Secondary | ICD-10-CM

## 2019-08-27 DIAGNOSIS — J40 Bronchitis, not specified as acute or chronic: Secondary | ICD-10-CM

## 2019-08-27 DIAGNOSIS — Z7984 Long term (current) use of oral hypoglycemic drugs: Secondary | ICD-10-CM

## 2019-08-27 DIAGNOSIS — R110 Nausea: Secondary | ICD-10-CM | POA: Diagnosis not present

## 2019-08-27 DIAGNOSIS — D696 Thrombocytopenia, unspecified: Secondary | ICD-10-CM | POA: Diagnosis present

## 2019-08-27 DIAGNOSIS — M4716 Other spondylosis with myelopathy, lumbar region: Secondary | ICD-10-CM | POA: Diagnosis present

## 2019-08-27 DIAGNOSIS — M6281 Muscle weakness (generalized): Secondary | ICD-10-CM | POA: Diagnosis present

## 2019-08-27 DIAGNOSIS — Z23 Encounter for immunization: Secondary | ICD-10-CM

## 2019-08-27 DIAGNOSIS — M5104 Intervertebral disc disorders with myelopathy, thoracic region: Secondary | ICD-10-CM | POA: Diagnosis present

## 2019-08-27 DIAGNOSIS — K219 Gastro-esophageal reflux disease without esophagitis: Secondary | ICD-10-CM | POA: Diagnosis present

## 2019-08-27 DIAGNOSIS — M25512 Pain in left shoulder: Secondary | ICD-10-CM

## 2019-08-27 DIAGNOSIS — M549 Dorsalgia, unspecified: Secondary | ICD-10-CM | POA: Diagnosis not present

## 2019-08-27 DIAGNOSIS — I639 Cerebral infarction, unspecified: Secondary | ICD-10-CM | POA: Diagnosis not present

## 2019-08-27 DIAGNOSIS — G729 Myopathy, unspecified: Secondary | ICD-10-CM | POA: Diagnosis not present

## 2019-08-27 DIAGNOSIS — M869 Osteomyelitis, unspecified: Secondary | ICD-10-CM | POA: Diagnosis not present

## 2019-08-27 DIAGNOSIS — M48061 Spinal stenosis, lumbar region without neurogenic claudication: Secondary | ICD-10-CM | POA: Diagnosis present

## 2019-08-27 DIAGNOSIS — M868X8 Other osteomyelitis, other site: Secondary | ICD-10-CM | POA: Diagnosis not present

## 2019-08-27 DIAGNOSIS — I351 Nonrheumatic aortic (valve) insufficiency: Secondary | ICD-10-CM | POA: Diagnosis not present

## 2019-08-27 DIAGNOSIS — Z79899 Other long term (current) drug therapy: Secondary | ICD-10-CM

## 2019-08-27 DIAGNOSIS — R079 Chest pain, unspecified: Secondary | ICD-10-CM | POA: Diagnosis not present

## 2019-08-27 DIAGNOSIS — Z872 Personal history of diseases of the skin and subcutaneous tissue: Secondary | ICD-10-CM | POA: Diagnosis not present

## 2019-08-27 DIAGNOSIS — Z87891 Personal history of nicotine dependence: Secondary | ICD-10-CM | POA: Diagnosis not present

## 2019-08-27 DIAGNOSIS — Z8349 Family history of other endocrine, nutritional and metabolic diseases: Secondary | ICD-10-CM

## 2019-08-27 DIAGNOSIS — M25511 Pain in right shoulder: Secondary | ICD-10-CM

## 2019-08-27 DIAGNOSIS — Z20828 Contact with and (suspected) exposure to other viral communicable diseases: Secondary | ICD-10-CM | POA: Diagnosis present

## 2019-08-27 DIAGNOSIS — E1142 Type 2 diabetes mellitus with diabetic polyneuropathy: Secondary | ICD-10-CM | POA: Diagnosis present

## 2019-08-27 DIAGNOSIS — H469 Unspecified optic neuritis: Secondary | ICD-10-CM | POA: Diagnosis present

## 2019-08-27 DIAGNOSIS — R0989 Other specified symptoms and signs involving the circulatory and respiratory systems: Secondary | ICD-10-CM | POA: Diagnosis not present

## 2019-08-27 DIAGNOSIS — E46 Unspecified protein-calorie malnutrition: Secondary | ICD-10-CM | POA: Diagnosis not present

## 2019-08-27 DIAGNOSIS — I459 Conduction disorder, unspecified: Secondary | ICD-10-CM | POA: Diagnosis not present

## 2019-08-27 DIAGNOSIS — B9561 Methicillin susceptible Staphylococcus aureus infection as the cause of diseases classified elsewhere: Secondary | ICD-10-CM | POA: Diagnosis present

## 2019-08-27 DIAGNOSIS — R053 Chronic cough: Secondary | ICD-10-CM

## 2019-08-27 DIAGNOSIS — R05 Cough: Secondary | ICD-10-CM

## 2019-08-27 DIAGNOSIS — M009 Pyogenic arthritis, unspecified: Secondary | ICD-10-CM | POA: Diagnosis not present

## 2019-08-27 DIAGNOSIS — M62838 Other muscle spasm: Secondary | ICD-10-CM | POA: Diagnosis present

## 2019-08-27 DIAGNOSIS — E1159 Type 2 diabetes mellitus with other circulatory complications: Secondary | ICD-10-CM | POA: Diagnosis present

## 2019-08-27 DIAGNOSIS — R799 Abnormal finding of blood chemistry, unspecified: Secondary | ICD-10-CM | POA: Diagnosis not present

## 2019-08-27 DIAGNOSIS — Z882 Allergy status to sulfonamides status: Secondary | ICD-10-CM | POA: Diagnosis not present

## 2019-08-27 DIAGNOSIS — Z751 Person awaiting admission to adequate facility elsewhere: Secondary | ICD-10-CM

## 2019-08-27 DIAGNOSIS — H47019 Ischemic optic neuropathy, unspecified eye: Secondary | ICD-10-CM | POA: Diagnosis present

## 2019-08-27 DIAGNOSIS — I252 Old myocardial infarction: Secondary | ICD-10-CM

## 2019-08-27 DIAGNOSIS — E860 Dehydration: Secondary | ICD-10-CM | POA: Diagnosis present

## 2019-08-27 DIAGNOSIS — R531 Weakness: Secondary | ICD-10-CM | POA: Diagnosis not present

## 2019-08-27 DIAGNOSIS — Z884 Allergy status to anesthetic agent status: Secondary | ICD-10-CM | POA: Diagnosis not present

## 2019-08-27 DIAGNOSIS — Z978 Presence of other specified devices: Secondary | ICD-10-CM | POA: Diagnosis not present

## 2019-08-27 DIAGNOSIS — M4646 Discitis, unspecified, lumbar region: Secondary | ICD-10-CM

## 2019-08-27 DIAGNOSIS — Z801 Family history of malignant neoplasm of trachea, bronchus and lung: Secondary | ICD-10-CM

## 2019-08-27 DIAGNOSIS — E1165 Type 2 diabetes mellitus with hyperglycemia: Secondary | ICD-10-CM | POA: Diagnosis not present

## 2019-08-27 DIAGNOSIS — Z880 Allergy status to penicillin: Secondary | ICD-10-CM | POA: Diagnosis not present

## 2019-08-27 DIAGNOSIS — I480 Paroxysmal atrial fibrillation: Secondary | ICD-10-CM | POA: Diagnosis present

## 2019-08-27 DIAGNOSIS — Z6833 Body mass index (BMI) 33.0-33.9, adult: Secondary | ICD-10-CM

## 2019-08-27 DIAGNOSIS — M00012 Staphylococcal arthritis, left shoulder: Secondary | ICD-10-CM | POA: Diagnosis present

## 2019-08-27 DIAGNOSIS — G8220 Paraplegia, unspecified: Secondary | ICD-10-CM | POA: Diagnosis not present

## 2019-08-27 DIAGNOSIS — G8918 Other acute postprocedural pain: Secondary | ICD-10-CM | POA: Diagnosis not present

## 2019-08-27 DIAGNOSIS — G8929 Other chronic pain: Secondary | ICD-10-CM | POA: Diagnosis present

## 2019-08-27 LAB — COMPREHENSIVE METABOLIC PANEL
ALT: 48 U/L — ABNORMAL HIGH (ref 0–44)
AST: 20 U/L (ref 15–41)
Albumin: 3.5 g/dL (ref 3.5–5.0)
Alkaline Phosphatase: 103 U/L (ref 38–126)
Anion gap: 15 (ref 5–15)
BUN: 36 mg/dL — ABNORMAL HIGH (ref 8–23)
CO2: 21 mmol/L — ABNORMAL LOW (ref 22–32)
Calcium: 10.1 mg/dL (ref 8.9–10.3)
Chloride: 99 mmol/L (ref 98–111)
Creatinine, Ser: 0.97 mg/dL (ref 0.61–1.24)
GFR calc Af Amer: >60 mL/min (ref >60)
GFR calc non Af Amer: >60 mL/min (ref >60)
Glucose, Bld: 173 mg/dL — ABNORMAL HIGH (ref 70–99)
Potassium: 4.3 mmol/L (ref 3.5–5.1)
Sodium: 135 mmol/L (ref 135–145)
Total Bilirubin: 1.7 mg/dL — ABNORMAL HIGH (ref 0.3–1.2)
Total Protein: 7.5 g/dL (ref 6.5–8.1)

## 2019-08-27 LAB — CBC WITH DIFFERENTIAL/PLATELET
Abs Immature Granulocytes: 0.06 10*3/uL (ref 0.00–0.07)
Basophils Absolute: 0 10*3/uL (ref 0.0–0.1)
Basophils Relative: 0 %
Eosinophils Absolute: 0.1 10*3/uL (ref 0.0–0.5)
Eosinophils Relative: 1 %
HCT: 56.4 % — ABNORMAL HIGH (ref 39.0–52.0)
Hemoglobin: 19.4 g/dL — ABNORMAL HIGH (ref 13.0–17.0)
Immature Granulocytes: 1 %
Lymphocytes Relative: 7 %
Lymphs Abs: 0.8 10*3/uL (ref 0.7–4.0)
MCH: 30.8 pg (ref 26.0–34.0)
MCHC: 34.4 g/dL (ref 30.0–36.0)
MCV: 89.7 fL (ref 80.0–100.0)
Monocytes Absolute: 1.2 10*3/uL — ABNORMAL HIGH (ref 0.1–1.0)
Monocytes Relative: 10 %
Neutro Abs: 9.8 10*3/uL — ABNORMAL HIGH (ref 1.7–7.7)
Neutrophils Relative %: 81 %
Platelets: 103 10*3/uL — ABNORMAL LOW (ref 150–400)
RBC: 6.29 MIL/uL — ABNORMAL HIGH (ref 4.22–5.81)
RDW: 13.4 % (ref 11.5–15.5)
WBC: 12 10*3/uL — ABNORMAL HIGH (ref 4.0–10.5)
nRBC: 0 % (ref 0.0–0.2)

## 2019-08-27 LAB — SARS CORONAVIRUS 2 (TAT 6-24 HRS): SARS Coronavirus 2: NEGATIVE

## 2019-08-27 LAB — C-REACTIVE PROTEIN: CRP: 1.3 mg/dL — ABNORMAL HIGH (ref <1.0)

## 2019-08-27 LAB — TSH: TSH: 1.414 u[IU]/mL (ref 0.350–4.500)

## 2019-08-27 LAB — SEDIMENTATION RATE: Sed Rate: 8 mm/hr (ref 0–16)

## 2019-08-27 LAB — PROTIME-INR
INR: 1.1 (ref 0.8–1.2)
Prothrombin Time: 14.4 seconds (ref 11.4–15.2)

## 2019-08-27 LAB — GLUCOSE, CAPILLARY
Glucose-Capillary: 263 mg/dL — ABNORMAL HIGH (ref 70–99)
Glucose-Capillary: 184 mg/dL — ABNORMAL HIGH (ref 70–99)

## 2019-08-27 LAB — CK: Total CK: 30 U/L — ABNORMAL LOW (ref 49–397)

## 2019-08-27 MED ORDER — INSULIN ASPART 100 UNIT/ML ~~LOC~~ SOLN
0.0000 [IU] | Freq: Three times a day (TID) | SUBCUTANEOUS | Status: DC
  Administered 2019-08-28: 5 [IU] via SUBCUTANEOUS
  Administered 2019-08-28 – 2019-08-29 (×3): 3 [IU] via SUBCUTANEOUS
  Administered 2019-08-29 – 2019-09-02 (×12): 2 [IU] via SUBCUTANEOUS
  Administered 2019-09-02 – 2019-09-03 (×3): 3 [IU] via SUBCUTANEOUS
  Administered 2019-09-03 (×2): 2 [IU] via SUBCUTANEOUS
  Administered 2019-09-04 (×2): 3 [IU] via SUBCUTANEOUS
  Administered 2019-09-04 – 2019-09-05 (×2): 2 [IU] via SUBCUTANEOUS
  Administered 2019-09-05 – 2019-09-06 (×3): 3 [IU] via SUBCUTANEOUS
  Administered 2019-09-07 – 2019-09-10 (×10): 2 [IU] via SUBCUTANEOUS
  Administered 2019-09-10 – 2019-09-11 (×2): 3 [IU] via SUBCUTANEOUS
  Administered 2019-09-11 – 2019-09-12 (×3): 2 [IU] via SUBCUTANEOUS
  Administered 2019-09-12 – 2019-09-13 (×3): 3 [IU] via SUBCUTANEOUS
  Administered 2019-09-14: 2 [IU] via SUBCUTANEOUS
  Administered 2019-09-14 – 2019-09-15 (×3): 3 [IU] via SUBCUTANEOUS
  Administered 2019-09-15: 2 [IU] via SUBCUTANEOUS

## 2019-08-27 MED ORDER — CYCLOBENZAPRINE HCL 10 MG PO TABS
10.0000 mg | ORAL_TABLET | Freq: Three times a day (TID) | ORAL | Status: DC | PRN
  Administered 2019-08-30 – 2019-09-04 (×6): 10 mg via ORAL
  Filled 2019-08-27 (×6): qty 1

## 2019-08-27 MED ORDER — BRIMONIDINE TARTRATE 0.15 % OP SOLN
1.0000 [drp] | Freq: Three times a day (TID) | OPHTHALMIC | Status: DC
  Administered 2019-08-28 – 2019-09-15 (×54): 1 [drp] via OPHTHALMIC
  Filled 2019-08-27 (×3): qty 5

## 2019-08-27 MED ORDER — ACETAMINOPHEN 650 MG RE SUPP: 650.0000 mg | Freq: Four times a day (QID) | RECTAL | Status: DC | PRN

## 2019-08-27 MED ORDER — MORPHINE SULFATE (PF) 2 MG/ML IV SOLN
2.0000 mg | INTRAVENOUS | Status: DC | PRN
  Administered 2019-08-27 – 2019-09-03 (×3): 2 mg via INTRAVENOUS
  Filled 2019-08-27 (×2): qty 1

## 2019-08-27 MED ORDER — MORPHINE SULFATE (PF) 2 MG/ML IV SOLN
INTRAVENOUS | Status: AC
  Administered 2019-08-27: 2 mg via INTRAVENOUS
  Filled 2019-08-27: qty 1

## 2019-08-27 MED ORDER — ONDANSETRON HCL 4 MG/2ML IJ SOLN: 4.0000 mg | Freq: Four times a day (QID) | INTRAMUSCULAR | Status: DC | PRN

## 2019-08-27 MED ORDER — BISACODYL 5 MG PO TBEC: 5.0000 mg | DELAYED_RELEASE_TABLET | Freq: Every day | ORAL | Status: DC | PRN

## 2019-08-27 MED ORDER — FLEET ENEMA 7-19 GM/118ML RE ENEM: 1.0000 | ENEMA | Freq: Once | RECTAL | Status: DC | PRN

## 2019-08-27 MED ORDER — DABIGATRAN ETEXILATE MESYLATE 150 MG PO CAPS: 150.0000 mg | ORAL_CAPSULE | Freq: Two times a day (BID) | ORAL | Status: DC

## 2019-08-27 MED ORDER — ACETAMINOPHEN 325 MG PO TABS
650.0000 mg | ORAL_TABLET | Freq: Four times a day (QID) | ORAL | Status: DC | PRN
  Administered 2019-08-28 – 2019-08-30 (×3): 650 mg via ORAL
  Filled 2019-08-27 (×3): qty 2

## 2019-08-27 MED ORDER — LORATADINE 10 MG PO TABS
10.0000 mg | ORAL_TABLET | Freq: Every day | ORAL | Status: DC
  Administered 2019-08-28 – 2019-09-15 (×19): 10 mg via ORAL
  Filled 2019-08-27 (×18): qty 1

## 2019-08-27 MED ORDER — ONDANSETRON HCL 4 MG PO TABS: 4.0000 mg | ORAL_TABLET | Freq: Four times a day (QID) | ORAL | Status: DC | PRN

## 2019-08-27 MED ORDER — LORAZEPAM 2 MG/ML IJ SOLN
1.0000 mg | INTRAMUSCULAR | Status: AC | PRN
  Administered 2019-08-28 (×2): 1 mg via INTRAVENOUS
  Filled 2019-08-27 (×2): qty 1

## 2019-08-27 MED ORDER — DOFETILIDE 250 MCG PO CAPS
500.0000 ug | ORAL_CAPSULE | Freq: Two times a day (BID) | ORAL | Status: DC
  Administered 2019-08-27 – 2019-08-28 (×2): 500 ug via ORAL
  Filled 2019-08-27: qty 1
  Filled 2019-08-27 (×2): qty 2

## 2019-08-27 MED ORDER — DOCUSATE SODIUM 100 MG PO CAPS
100.0000 mg | ORAL_CAPSULE | Freq: Two times a day (BID) | ORAL | Status: DC
  Administered 2019-08-27 – 2019-08-29 (×4): 100 mg via ORAL
  Filled 2019-08-27 (×4): qty 1

## 2019-08-27 MED ORDER — OXYCODONE-ACETAMINOPHEN 5-325 MG PO TABS
1.0000 | ORAL_TABLET | ORAL | Status: DC | PRN
  Administered 2019-08-27 – 2019-09-04 (×19): 1 via ORAL
  Filled 2019-08-27 (×19): qty 1

## 2019-08-27 MED ORDER — ATORVASTATIN CALCIUM 80 MG PO TABS
80.0000 mg | ORAL_TABLET | Freq: Every day | ORAL | Status: DC
  Administered 2019-08-27 – 2019-09-14 (×19): 80 mg via ORAL
  Filled 2019-08-27 (×20): qty 1

## 2019-08-27 MED ORDER — PANTOPRAZOLE SODIUM 40 MG PO TBEC
40.0000 mg | DELAYED_RELEASE_TABLET | Freq: Every day | ORAL | Status: DC
  Administered 2019-08-28 – 2019-09-15 (×19): 40 mg via ORAL
  Filled 2019-08-27 (×19): qty 1

## 2019-08-27 MED ORDER — INSULIN ASPART 100 UNIT/ML ~~LOC~~ SOLN
0.0000 [IU] | Freq: Every day | SUBCUTANEOUS | Status: DC
  Administered 2019-09-04: 2 [IU] via SUBCUTANEOUS

## 2019-08-27 MED ORDER — LACTATED RINGERS IV SOLN
INTRAVENOUS | Status: DC
  Administered 2019-08-27 – 2019-08-28 (×2): via INTRAVENOUS

## 2019-08-27 MED ORDER — HYDROMORPHONE HCL 1 MG/ML IJ SOLN
0.5000 mg | Freq: Once | INTRAMUSCULAR | Status: AC
  Administered 2019-08-27: 0.5 mg via INTRAVENOUS
  Filled 2019-08-27: qty 1

## 2019-08-27 MED ORDER — FLUTICASONE FUROATE-VILANTEROL 200-25 MCG/INH IN AEPB
1.0000 | INHALATION_SPRAY | Freq: Every day | RESPIRATORY_TRACT | Status: DC | PRN
  Filled 2019-08-27: qty 28

## 2019-08-27 MED ORDER — SUVOREXANT 20 MG PO TABS: 1.0000 | ORAL_TABLET | Freq: Every day | ORAL | Status: DC

## 2019-08-27 MED ORDER — SODIUM CHLORIDE 0.9 % IV BOLUS
1000.0000 mL | Freq: Once | INTRAVENOUS | Status: AC
  Administered 2019-08-27: 1000 mL via INTRAVENOUS

## 2019-08-27 MED ORDER — IPRATROPIUM-ALBUTEROL 0.5-2.5 (3) MG/3ML IN SOLN
3.0000 mL | Freq: Four times a day (QID) | RESPIRATORY_TRACT | Status: DC
  Administered 2019-08-27 (×2): 3 mL via RESPIRATORY_TRACT
  Filled 2019-08-27 (×2): qty 3

## 2019-08-27 MED ORDER — POLYETHYLENE GLYCOL 3350 17 G PO PACK: 17.0000 g | PACK | Freq: Every day | ORAL | Status: DC | PRN

## 2019-08-27 NOTE — Plan of Care (Signed)
  Problem: Education: ?Goal: Knowledge of General Education information will improve ?Description: Including pain rating scale, medication(s)/side effects and non-pharmacologic comfort measures ?Outcome: Progressing ?  ?Problem: Health Behavior/Discharge Planning: ?Goal: Ability to manage health-related needs will improve ?Outcome: Progressing ?  ?Problem: Clinical Measurements: ?Goal: Ability to maintain clinical measurements within normal limits will improve ?Outcome: Progressing ?Goal: Will remain free from infection ?Outcome: Progressing ?Goal: Diagnostic test results will improve ?Outcome: Progressing ?Goal: Respiratory complications will improve ?Outcome: Progressing ?  ?Problem: Activity: ?Goal: Risk for activity intolerance will decrease ?Outcome: Progressing ?  ?Problem: Coping: ?Goal: Level of anxiety will decrease ?Outcome: Progressing ?  ?Problem: Elimination: ?Goal: Will not experience complications related to bowel motility ?Outcome: Progressing ?Goal: Will not experience complications related to urinary retention ?Outcome: Progressing ?  ?Problem: Pain Managment: ?Goal: General experience of comfort will improve ?Outcome: Progressing ?  ?Problem: Safety: ?Goal: Ability to remain free from injury will improve ?Outcome: Progressing ?  ?Problem: Skin Integrity: ?Goal: Risk for impaired skin integrity will decrease ?Outcome: Progressing ?  ?

## 2019-08-27 NOTE — ED Notes (Signed)
Dr. Lorin Mercy in with patient at this time.

## 2019-08-27 NOTE — ED Notes (Signed)
ED TO INPATIENT HANDOFF REPORT  ED Nurse Name and Phone #: Celene Squibb RN  S Name/Age/Gender [name] [name]. 68 y.o. male Room/Bed: 033C/033C  Code Status   Code Status: Prior  Home/SNF/Other Home Patient oriented to: self, place, time and situation Is this baseline? Yes   Triage Complete: Triage complete  Chief Complaint shoulder pn,aaa  Triage Note Pt presents with c/o increasing low back pain and inability to get out of bed or eat and drink.  Recent diagnosis of triple A and was sent home for f/u after discharge.  Pt unaable to move arms to undress on arrival d/t pain.     Allergies Allergies  Allergen Reactions  . Novocain [Procaine]   . Penicillins     Childhood reaction-details unknown  . Pseudoephedrine Other (See Comments)    Patient went into afib  . Sulfonamide Derivatives     Childhood reaction   . Cardizem [Diltiazem] Rash  . Pseudoephedrine Hcl Palpitations    Level of Care/Admitting Diagnosis ED Disposition    ED Disposition Condition [facility] Area: [place] [[id number]]  Level of Care: Med-Surg [16]  I expect the patient will be discharged within 24 hours: No (not a candidate for 5C-Observation unit)  Covid Evaluation: Asymptomatic Screening Protocol (No Symptoms)  Diagnosis: Back pain [id number]  Admitting Physician: Karmen Bongo [[id number]]  Attending Physician: Karmen Bongo [[id number]]  PT Class (Do Not Modify): Observation [104]  PT Acc Code (Do Not Modify): Observation [10022]       B Medical/Surgery History Past Medical History:  Diagnosis Date  . Atrial fibrillation ([place]) 03/22/2009   a. s/p multiple DCCV; b. no coumadin due to low TE risk profile; c. Tikosyn Rx  . Coronary atherosclerosis of native coronary artery 11/2002   a. s/p stent to LAD 12/03; OM2 occluded at cath 12/03; d. myoview 5/10: no ischemia;  e. echo 7/11: EF 55%, BAE, mild RVE, PASP 41-45; Myoview was in March 2013. There was no  ischemia or infarction, EF 51%   . Cutaneous abscess of back excluding buttocks 07/04/2014   Appears to stem from possibly a cyst very large area 6 cm contact surgeon office   . Diabetes mellitus without complication ([place])   . Drusen body    see opth note  . ERECTILE DYSFUNCTION 03/22/2009  . GERD 03/22/2009  . HYPERGLYCEMIA 04/25/2010  . HYPERLIPIDEMIA 03/22/2009  . 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 LATERAL EPICONDYLITIS, LEFT 10/24/2009  . LIVER FUNCTION TESTS, ABNORMAL 04/25/2010  . Local reaction to immunization 05/05/2012   minor resolving  zostavax   . Myocardial infarction ([place]) mi2003  . Numbness in left leg    foot related to back disease and surgery  . Obesity, unspecified 04/24/2009  . Perforated appendicitis with necrosis s/p open appendectomy 06/07/14 06/04/2014  . Renal cyst    Characterized by MRI as simple  . Ruptured suppurative appendicitis    2015   . SLEEP APNEA, OBSTRUCTIVE 03/22/2009   compliant with CPAP  . THROMBOCYTOPENIA 08/16/2010  . TOBACCO USE, QUIT 10/24/2009  . 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 Umbilical hernia    Past Surgical History:  Procedure Laterality Date  . APPENDECTOMY N/A 06/06/2014   Procedure: APPENDECTOMY;  Surgeon: Odis Hollingshead, MD;  [facility];  Service: General;  Laterality: N/A;  . COLON SURGERY    . COLONOSCOPY  11/15/2011   Procedure: COLONOSCOPY;  Surgeon:  Juanita Craver, MD;  Location: [facility];  Service: Endoscopy;  Laterality: N/A;  . COLONOSCOPY WITH PROPOFOL N/A 01/03/2017   Procedure: COLONOSCOPY WITH PROPOFOL;  Surgeon: Carol Ada, MD;  [facility];  Service: Endoscopy;  Laterality: N/A;  . cyst on epiglottis  08/2002  . ESOPHAGOGASTRODUODENOSCOPY  11/15/2011   Procedure: ESOPHAGOGASTRODUODENOSCOPY (EGD);  Surgeon: Juanita Craver, MD;  [facility];  Service: Endoscopy;  Laterality: N/A;  . LAPAROSCOPIC APPENDECTOMY N/A 06/06/2014   Procedure: APPENDECTOMY  LAPAROSCOPIC attemted;  Surgeon: Odis Hollingshead, MD;  [facility];  Service: General;  Laterality: N/A;  . LUMBAR DISC SURGERY     two  holes in spinalcovering  . stent     LAD DUS 2004  . surgery l4-l5   1998   ruptured x 3  . ulnar neuropathy    . UMBILICAL HERNIA REPAIR     mesh     A IV Location/Drains/Wounds Patient Lines/Drains/Airways Status   Active Line/Drains/Airways    Name:   Placement date:   Placement time:   Site:   Days:   Peripheral IV 06/04/18 Right;Lateral Forearm   06/04/18    1709    Forearm   449   Peripheral IV 06/05/18 Right;Anterior Forearm   06/05/18    1819    Forearm   448   Peripheral IV 08/20/19 Right Forearm   08/20/19    0238    Forearm   7   Peripheral IV 08/27/19 Right Forearm   08/27/19    0840    Forearm   less than 1   Closed System Drain 1 Abdomen Bulb (JP) 19 Fr.   06/06/14    0937    Abdomen   1908   Closed System Drain 1 Left;Inferior Abdomen Bulb (JP) 19 Fr.   06/06/14    0946    Abdomen   1908   Airway   01/03/17    0956     966   Incision (Closed) 06/06/14 Abdomen   06/06/14    0951     1908   Incision - 4 Ports Abdomen 1: Umbilicus 2: Medial;Right;Umbilicus 3: Left;Medial;Umbilicus Anterior;Upper;Umbilicus   99991111    0000000     1908          Intake/Output Last 24 hours  Intake/Output Summary (Last 24 hours) at 08/27/2019 1251 Last data filed at 08/27/2019 1145 Gross per 24 hour  Intake 1000 ml  Output -  Net 1000 ml    Labs/Imaging Results for orders placed or performed during the hospital encounter of 08/27/19 (from the past 48 hour(s))  Comprehensive metabolic panel     Status: Abnormal   Collection Time: 08/27/19  9:08 AM  Result Value Ref Range   Sodium 135 135 - 145 mmol/L   Potassium 4.3 3.5 - 5.1 mmol/L   Chloride 99 98 - 111 mmol/L   CO2 21 (L) 22 - 32 mmol/L   Glucose, Bld 173 (H) 70 - 99 mg/dL   BUN 36 (H) 8 - 23 mg/dL   Creatinine, Ser 0.97 0.61 - 1.24 mg/dL   Calcium 10.1 8.9 - 10.3 mg/dL   Total  Protein 7.5 6.5 - 8.1 g/dL   Albumin 3.5 3.5 - 5.0 g/dL   AST 20 15 - 41 U/L   ALT 48 (H) 0 - 44 U/L   Alkaline Phosphatase 103 38 - 126 U/L   Total Bilirubin 1.7 (H) 0.3 - 1.2 mg/dL   GFR calc non Af Amer >60 >  60 mL/min   GFR calc Af Amer >60 >60 mL/min   Anion gap 15 5 - 15    Comment: Performed at [facility] [address], [place], [place] [address]  CBC with Differential     Status: Abnormal   Collection Time: 08/27/19  9:08 AM  Result Value Ref Range   WBC 12.0 (H) 4.0 - 10.5 K/uL   RBC 6.29 (H) 4.22 - 5.81 MIL/uL   Hemoglobin 19.4 (H) 13.0 - 17.0 g/dL   HCT 56.4 (H) 39.0 - 52.0 %   MCV 89.7 80.0 - 100.0 fL   MCH 30.8 26.0 - 34.0 pg   MCHC 34.4 30.0 - 36.0 g/dL   RDW 13.4 11.5 - 15.5 %   Platelets 103 (L) 150 - 400 K/uL    Comment: REPEATED TO VERIFY PLATELET COUNT CONFIRMED BY SMEAR Immature Platelet Fraction may be clinically indicated, consider ordering this additional test [id number]    nRBC 0.0 0.0 - 0.2 %   Neutrophils Relative % 81 %   Neutro Abs 9.8 (H) 1.7 - 7.7 K/uL   Lymphocytes Relative 7 %   Lymphs Abs 0.8 0.7 - 4.0 K/uL   Monocytes Relative 10 %   Monocytes Absolute 1.2 (H) 0.1 - 1.0 K/uL   Eosinophils Relative 1 %   Eosinophils Absolute 0.1 0.0 - 0.5 K/uL   Basophils Relative 0 %   Basophils Absolute 0.0 0.0 - 0.1 K/uL   Immature Granulocytes 1 %   Abs Immature Granulocytes 0.06 0.00 - 0.07 K/uL    Comment: Performed at [facility] [address] [place], [place] [address]  Sedimentation rate     Status: None   Collection Time: 08/27/19  9:08 AM  Result Value Ref Range   Sed Rate 8 0 - 16 mm/hr    Comment: Performed at [facility] [address], [place], [place] [address]  C-reactive protein     Status: Abnormal   Collection Time: 08/27/19  9:08 AM  Result Value Ref Range   CRP 1.3 (H) <1.0 mg/dL    Comment: Performed at [facility] [address], [place], [place] [address]   No results found.  Pending  Labs Unresulted Labs (From admission, onward)    Start     Ordered   08/27/19 0909  Culture, blood (routine x 2)  BLOOD CULTURE X 2,   STAT     08/27/19 0908   08/27/19 0909  SARS CORONAVIRUS 2 (TAT 6-12 HRS) Nasal Swab Aptima Multi Swab  (Asymptomatic/Tier 2 Patients Labs)  Once,   STAT    Question Answer Comment  Is this test for diagnosis or screening Screening   Symptomatic for COVID-19 as defined by CDC No   Hospitalized for COVID-19 No   Admitted to ICU for COVID-19 No   Previously tested for COVID-19 No   Resident in a congregate (group) care setting No   Employed in healthcare setting No      08/27/19 0910          Vitals/Pain Today's Vitals   08/27/19 1030 08/27/19 1045 08/27/19 1049 08/27/19 1245  BP:  122/87  134/83  Pulse:  (!) 103  (!) 107  Resp:  15  (!) 23  Temp:      TempSrc:      SpO2:  94%  97%  PainSc: 7   8      Isolation Precautions No active isolations  Medications Medications  HYDROmorphone (  DILAUDID) injection 0.5 mg (0.5 mg Intravenous Given 08/27/19 0948)  sodium chloride 0.9 % bolus 1,000 mL (0 mLs Intravenous Stopped 08/27/19 1145)  HYDROmorphone (DILAUDID) injection 0.5 mg (0.5 mg Intravenous Given 08/27/19 1104)    Mobility walks High fall risk   Focused Assessments Back pain   R Recommendations: See Admitting Provider Note  Report given to:   Additional Notes:

## 2019-08-27 NOTE — Progress Notes (Signed)
RT NOTE:  NIF: greater than -40 VC: 1.9L  Great patient effort, while lying down however.

## 2019-08-27 NOTE — ED Notes (Signed)
Pt medicated and the smallest movement causes him distress.  Updated on POC.

## 2019-08-27 NOTE — ED Provider Notes (Signed)
[facility] EMERGENCY DEPARTMENT Provider Note   CSN: [id number] Arrival date & time: 08/27/19  [id number]     History   Chief Complaint No chief complaint on file.   HPI [name] [name]. is a 68 y.o. male.     HPI Patient presents with back pain and shoulder pain.  Around 1 month ago patient had optic edema.  Admitted to [facility] for IV steroids followed by prednisone.  Then developed cellulitis on his leg.  Also shortly after developed pain in his lower back.  Severe.  States pain is worse with movements.  States he is basically been laying in bed for the last 2 weeks.  1 week ago was seen in the ER for the back pain.  Had CT angiography done that showed thoracic aortic aneurysm that is likely not causing the pain.  States the pain is gotten worse.  No relief with narcotic pain medicine.  No loss of bladder or bowel control but states he really needs help to get up to go to the bathroom.  States that last night he was unable to get up.  Also complaining pain in both her shoulders.  No chest pain but only pain with both shoulders.  States feels the pain is more in the deltoid.  No IV drug use.  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  Has seen Dr. [name] from orthopedic surgery for the back pain.  They referred him to follow-up with Dr. Sherwood Gambler, who has operated on his back around 22 years ago Past Medical History:  Diagnosis Date  . Atrial fibrillation ([place]) 03/22/2009   a. s/p multiple DCCV; b. no coumadin due to low TE risk profile; c. Tikosyn Rx  . Coronary atherosclerosis of native coronary artery 11/2002   a. s/p stent to LAD 12/03; OM2 occluded at cath 12/03; d. myoview 5/10: no ischemia;  e. echo 7/11: EF 55%, BAE, mild RVE, PASP 41-45; Myoview was in March 2013. There was no ischemia or infarction, EF 51%   . Cutaneous abscess of back excluding buttocks 07/04/2014   Appears to stem from  possibly a cyst very large area 6 cm contact surgeon office   . Diabetes mellitus without complication ([place])   . Drusen body    see opth note  . ERECTILE DYSFUNCTION 03/22/2009  . GERD 03/22/2009  . HYPERGLYCEMIA 04/25/2010  . HYPERLIPIDEMIA 03/22/2009  . 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 LATERAL EPICONDYLITIS, LEFT 10/24/2009  . LIVER FUNCTION TESTS, ABNORMAL 04/25/2010  . Local reaction to immunization 05/05/2012   minor resolving  zostavax   . Myocardial infarction ([place]) mi2003  . Numbness in left leg    foot related to back disease and surgery  . Obesity, unspecified 04/24/2009  . Perforated appendicitis with necrosis s/p open appendectomy 06/07/14 06/04/2014  . Renal cyst    Characterized by MRI as simple  . Ruptured suppurative appendicitis    2015   . SLEEP APNEA, OBSTRUCTIVE 03/22/2009   compliant with CPAP  . THROMBOCYTOPENIA 08/16/2010  . TOBACCO USE, QUIT 10/24/2009  . 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 Umbilical hernia     Patient Active Problem List   Diagnosis Date Noted  . Acute back pain with sciatica, right 09/04/2017  . Right hip pain 09/04/2017  . Thrombocytopenia ([place]) 06/25/2016  . Atrial fibrillation ([place]) 01/01/2016  . 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 Iliac aneurysm ([place])   . Morbid obesity ([place]) 07/08/2014  . Type 2 diabetes mellitus with other circulatory complications ([place]) 0000000  . Chronic anticoagulation 12/21/2013  . Sleep disorder 06/21/2013  . Decreased hearing 08/06/2012  . Hemorrhoids 08/06/2012  . High risk medication use 05/05/2012  . Anticoagulation management encounter 05/05/2012  . Retinal tear 04/29/2012  . Personal history of colonic polyps 05/05/2011  . Visit for preventive health examination 05/05/2011  . PALPITATIONS 08/16/2010  . LIVER FUNCTION TESTS, ABNORMAL 04/25/2010  . Lateral epicondylitis 10/24/2009  . TOBACCO USE, QUIT 10/24/2009  . OBESITY, UNSPECIFIED 04/24/2009  .  Hyperlipidemia 03/22/2009  . ERECTILE DYSFUNCTION 03/22/2009  . Obstructive sleep apnea 03/22/2009  . 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 MYOCARDIAL INFARCTION, HX OF 03/22/2009  . Coronary atherosclerosis 03/22/2009  . Atrial fibrillation with RVR ([place]) 03/22/2009  . GERD 03/22/2009    Past Surgical History:  Procedure Laterality Date  . APPENDECTOMY N/A 06/06/2014   Procedure: APPENDECTOMY;  Surgeon: Odis Hollingshead, MD;  Location: [facility];  Service: General;  Laterality: N/A;  . COLON SURGERY    . COLONOSCOPY  11/15/2011   Procedure: COLONOSCOPY;  Surgeon: Juanita Craver, MD;  Location: [facility];  Service: Endoscopy;  Laterality: N/A;  . COLONOSCOPY WITH PROPOFOL N/A 01/03/2017   Procedure: COLONOSCOPY WITH PROPOFOL;  Surgeon: Carol Ada, MD;  Location: [facility] END[facility];  Service: Endoscopy;  Laterality: N/A;  . cyst on epiglottis  08/2002  . ESOPHAGOGASTRODUODENOSCOPY  11/15/2011   Procedure: ESOPHAGOGASTRODUODENOSCOPY (EGD);  Surgeon: Juanita Craver, MD;  Location: [facility];  Service: Endoscopy;  Laterality: N/A;  . LAPAROSCOPIC APPENDECTOMY N/A 06/06/2014   Procedure: APPENDECTOMY LAPAROSCOPIC attemted;  Surgeon: Odis Hollingshead, MD;  Location: [facility];  Service: General;  Laterality: N/A;  . LUMBAR DISC SURGERY     two  holes in spinalcovering  . stent     LAD DUS 2004  . surgery l4-l5   1998   ruptured x 3  . ulnar neuropathy    . UMBILICAL HERNIA REPAIR     mesh        Home Medications    Prior to Admission medications   Medication Sig Start Date End Date Taking? Authorizing Provider  acetaminophen (TYLENOL) 325 MG tablet Take 650 mg by mouth every 6 (six) hours as needed for mild pain.   Yes [provider]  albuterol (PROVENTIL HFA;VENTOLIN HFA) 108 (90 Base) MCG/ACT inhaler Inhale 2 puffs into the lungs every 6 (six) hours as needed for wheezing or shortness of breath. 03/24/17  Yes Panosh, Standley Brooking, MD  ALPHAGAN P 0.1 % SOLN Place 1 drop into both eyes 3  (three) times daily.  07/20/18  Yes [provider]  atorvastatin (LIPITOR) 80 MG tablet TAKE 1 TABLET BY MOUTH  DAILY Patient taking differently: Take 80 mg by mouth at bedtime.  03/11/19  Yes Panosh, Standley Brooking, MD  cyclobenzaprine (FLEXERIL) 10 MG tablet Take 1 tablet (10 mg total) by mouth 3 (three) times daily as needed for muscle spasms. 08/17/19  Yes Pete Pelt, PA-C  diclofenac sodium (VOLTAREN) 1 % GEL Apply 4 g topically 4 (four) times daily. 08/20/19  Yes Palumbo, April, MD  diphenhydrAMINE (BENADRYL) 25 MG tablet Take 25 mg by mouth at bedtime as needed for sleep.   Yes [provider]  dofetilide (TIKOSYN) 500 MCG capsule Take 1 capsule (500 mcg total) by mouth 2 (two) times daily. 07/05/19  Yes Lelon Perla,  MD  famotidine (PEPCID) 10 MG tablet Take 10 mg by mouth as needed for heartburn.    Yes [provider]  fexofenadine (ALLEGRA) 180 MG tablet Take 180 mg by mouth daily.   Yes [provider]  fish oil-omega-3 fatty acids 1000 MG capsule Take 1 g by mouth 2 (two) times daily.    Yes [provider]  fluticasone (FLONASE) 50 MCG/ACT nasal spray Place 2 sprays into both nostrils daily as needed for allergies. 05/05/19  Yes Panosh, Standley Brooking, MD  fluticasone furoate-vilanterol (BREO ELLIPTA) 200-25 MCG/INH AEPB Inhale 1 puff into the lungs daily as needed (shortness of breath).    Yes [provider]  HYDROcodone-acetaminophen (NORCO) 5-325 MG tablet Take 1 tablet by mouth every 4 (four) hours as needed for moderate pain. One to two tabs every 4-6 hours for pain 08/17/19  Yes Carlis Abbott, Gillermo Murdoch, PA-C  KLOR-CON M20 20 MEQ tablet TAKE 1 TABLET BY MOUTH EVERY DAY Patient taking differently: Take 20 mEq by mouth daily.  06/03/19  Yes Lelon Perla, MD  lidocaine (LIDODERM) 5 % Place 1 patch onto the skin daily. Remove & Discard patch within 12 hours or as directed by MD 08/20/19  Yes Palumbo, April, MD  Magnesium (V-R MAGNESIUM) 250 MG  TABS Take 250 mg by mouth at bedtime. Take with 500mg  tablet to equal 750mg     Yes [provider]  Magnesium Oxide 500 MG TABS Take 500 mg by mouth at bedtime. Take with 250mg  tablet to equal 750mg    Yes [provider]  metFORMIN (GLUCOPHAGE-XR) 500 MG 24 hr tablet Take as directed Patient taking differently: Take 500 mg by mouth 3 (three) times daily.  07/21/19  Yes Panosh, Standley Brooking, MD  MULTIPLE VITAMIN PO Take 1 tablet by mouth every evening.    Yes [provider]  ondansetron (ZOFRAN ODT) 4 MG disintegrating tablet Take 1 tablet (4 mg total) by mouth every 8 (eight) hours as needed for nausea or vomiting. 09/02/18  Yes Panosh, Standley Brooking, MD  oxyCODONE-acetaminophen (PERCOCET/ROXICET) 5-325 MG tablet Take 1 tablet by mouth every 4 (four) hours as needed for severe pain. 08/24/19  Yes Panosh, Standley Brooking, MD  pantoprazole (PROTONIX) 40 MG tablet Take 40 mg by mouth daily. 08/05/19  Yes [provider]  PRADAXA 150 MG CAPS capsule TAKE 1 CAPSULE BY MOUTH  EVERY 12 HOURS Patient taking differently: Take 150 mg by mouth 2 (two) times daily.  03/11/19  Yes Panosh, Standley Brooking, MD  predniSONE (DELTASONE) 20 MG tablet Take 3,3,3,2,2,2,1,1,1, 1/.2 1./2 1/.2 pills po qd taper 08/24/19  Yes Panosh, Standley Brooking, MD  Suvorexant (BELSOMRA) 20 MG TABS Take 1 tablet by mouth at bedtime. 05/05/19  Yes Panosh, Standley Brooking, MD  tamsulosin (FLOMAX) 0.4 MG CAPS capsule TAKE 1 CAPSULE BY MOUTH EVERY DAY Patient taking differently: Take 0.4 mg by mouth daily as needed (repeat kidney stone).  08/21/18  Yes Panosh, Standley Brooking, MD  triamcinolone cream (KENALOG) 0.1 % Apply 1 application topically 2 (two) times daily. As needed. Patient taking differently: Apply 1 application topically 2 (two) times daily as needed (rash).  02/12/19  Yes Panosh, Standley Brooking, MD  nitroGLYCERIN (NITROSTAT) 0.4 MG SL tablet Place 1 tablet (0.4 mg total) under the tongue every 5 (five) minutes as needed for chest pain. 02/01/19   Lelon Perla, MD  [facility] VERIO test strip USE TWICE A DAY 02/03/19   Panosh, Standley Brooking, MD    Family History  Family History  Problem Relation Age of Onset  . Thyroid disease Mother   . Ovarian cancer Mother   . Breast cancer Mother   . Lung cancer Father   . Cancer Father     Social History Social History   Tobacco Use  . Smoking status: Former Smoker    Packs/day: 2.00    Years: 42.00    Pack years: 84.00    Types: Cigarettes    Quit date: 12/30/2008    Years since quitting: 10.6  . Smokeless tobacco: Never Used  . Tobacco comment: started at age 89; 1-2 ppd; quit 2010  Substance Use Topics  . Alcohol use: No    Alcohol/week: 0.0 standard drinks    Comment: rarely; maybe 1 beer a year  . Drug use: No     Allergies   Novocain [procaine], Penicillins, Pseudoephedrine, Sulfonamide derivatives, Cardizem [diltiazem], and Pseudoephedrine hcl   Review of Systems Review of Systems  Constitutional: Positive for appetite change. Negative for fever.  HENT: Negative for congestion.   Cardiovascular: Negative for chest pain.  Gastrointestinal: Negative for abdominal distention.  Genitourinary: Negative for flank pain.  Musculoskeletal: Positive for back pain.       Bilateral shoulder pain.  Skin: Negative for rash.  Neurological: Negative for weakness and numbness.  Psychiatric/Behavioral: Negative for confusion.     Physical Exam Updated Vital Signs BP 122/87   Pulse (!) 103   Temp 98 F (36.7 C) (Oral)   Resp 15   SpO2 94%   Physical Exam Vitals signs and nursing note reviewed.  Constitutional:      Comments: Patient is lying on his back in bed.  Appears uncomfortable.  HENT:     Head: Atraumatic.     Mouth/Throat:     Mouth: Mucous membranes are dry.  Pulmonary:     Effort: Pulmonary effort is normal.     Breath sounds: No wheezing or rhonchi.  Abdominal:     Tenderness: There is no abdominal tenderness.  Musculoskeletal:        General: Tenderness present.      Comments: Tenderness over bilateral shoulders.  Decreased range of motion due to pain.  Pain is with both active and passive movement.  No rash.  No cervical tenderness.  No thoracic spine tenderness.  Actually very little lumbar tenderness.  Does have some pain with straight leg raise bilaterally but actually with passive movement the legs can get rather high.  However has difficulty lifting actively.  Skin:    General: Skin is warm.  Neurological:     Mental Status: He is alert.     Comments: Perineal sensation grossly intact.      ED Treatments / Results  Labs (all labs ordered are listed, but only abnormal results are displayed) Labs Reviewed  COMPREHENSIVE METABOLIC PANEL - Abnormal; Notable for the following components:      Result Value   CO2 21 (*)    Glucose, Bld 173 (*)    BUN 36 (*)    ALT 48 (*)    Total Bilirubin 1.7 (*)    All other components within normal limits  CBC WITH DIFFERENTIAL/PLATELET - Abnormal; Notable for the following components:   WBC 12.0 (*)    RBC 6.29 (*)    Hemoglobin 19.4 (*)    HCT 56.4 (*)    Platelets 103 (*)    Neutro Abs 9.8 (*)    Monocytes Absolute 1.2 (*)    All other components within  normal limits  C-REACTIVE PROTEIN - Abnormal; Notable for the following components:   CRP 1.3 (*)    All other components within normal limits  CULTURE, BLOOD (ROUTINE X 2)  CULTURE, BLOOD (ROUTINE X 2)  SARS CORONAVIRUS 2 (TAT 6-12 HRS)  SEDIMENTATION RATE    EKG EKG Interpretation  Date/Time:  Friday August 27 2019 09:02:45 EDT Ventricular Rate:  106 PR Interval:    QRS Duration: 108 QT Interval:  352 QTC Calculation: 468 R Axis:   139 Text Interpretation:  Sinus tachycardia RSR' in V1 or V2, right VCD or RVH Inferior infarct, old No significant change since last tracing Confirmed by Davonna Belling [phone]) on 08/27/2019 9:30:14 AM   Radiology No results found.  Procedures Procedures (including critical care  time)  Medications Ordered in ED Medications  sodium chloride 0.9 % bolus 1,000 mL (has no administration in time range)  HYDROmorphone (DILAUDID) injection 0.5 mg (0.5 mg Intravenous Given 08/27/19 0948)     Initial Impression / Assessment and Plan / ED Course  I have reviewed the triage vital signs and the nursing notes.  Pertinent labs & imaging results that were available during my care of the patient were reviewed by me and considered in my medical decision making (see chart for details).        Patient presents with back pain.  Has had for the last month.  Started after he had been on high-dose steroids for optic neuritis.  Also questionable source of infection on the ankle at that time.  CT scan done to evaluate aorta a few days ago showed some thoracic aortic aneurysm but unlikely because of this.  No abdominal pathology.  However pain unrelieved with narcotic pain medicine at home.  States has been mostly bedbound.  States has not really been having an appetite and really has eating and drinking less partial be due to decreased ability to get out of bed.  Patient states his wife is disabled he cannot really take care of him.  Sed rate mildly elevated.  White count mildly elevated now and was recently 17.  Patient states he will have an issue with an MRI.  At this point I think patient would benefit from [place] the hospital since he is failed outpatient management of the pain will need further work-up of the back and shoulder pain.  Will discuss with hospitalist.  Final Clinical Impressions(s) / ED Diagnoses   Final diagnoses:  Acute low back pain, unspecified back pain laterality, unspecified whether sciatica present  Acute pain of both shoulders  Dehydration    ED Discharge Orders    None       Davonna Belling, MD 08/27/19 1051

## 2019-08-27 NOTE — Progress Notes (Addendum)
Pt admitted to unit at 1650. Skin check performed: pt with petichiae to lower legs, more on right than left; also with skin tag to left chest. Pt hooked up to progressive care monitor; on 2L [place] which he does not wear at home. PT with severe pain to back and shoulders - he can not move his arms at the shoulders without severe pain.   Yellow MEWS alert fired for pt's HR and RR. Dr Lorin Mercy notified (see flowsheet) and IV morphine ordered for breakthrough pain.  Visitor policy explained to patient.  Plan is for neuro workup, most importantly MRI, which pt is very nervous about. He relates that last time he had an MRI, the anxiety medication given started to wear off before the end of the test. MD aware, order in for prn doses.    Vital Signs MEWS/VS Documentation      08/27/2019 1245 08/27/2019 1342 08/27/2019 1649 08/27/2019 1657   MEWS Score:  2  2  3  3    MEWS Score Color:  Yellow  Yellow  Yellow  Yellow   Resp:  (!) 23  -  (!) 22  -   Pulse:  (!) 107  (!) 110  (!) 113  -   BP:  134/83  134/80  129/72  -   Temp:  -  98.7 F (37.1 C)  98.5 F (36.9 C)  -   O2 Device:  -  Nasal Cannula  Nasal Cannula  -   O2 Flow Rate (L/min):  -  -  2 L/min  -           Wang [place] 08/27/2019,6:22 PM

## 2019-08-27 NOTE — Consult Note (Addendum)
Neurology Consultation  Reason for Consult: Weakness and decreased vision Referring Physician: Lorin Mercy  History is obtained from: Patient  HPI: [name] [name]. is a 68 y.o. male with history of ulnar neuropathy, tobacco abuse, thrombocytopenia, ruptured appendix, numbness in left leg, hyperlipidemia, hyperglycemia, erectile dysfunction, diabetes, drusen bodies, CAD and atrial fibrillation.  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  His Opthalmologist thought this was secondary to inflammation that resulted in an occluded retinal artery and placed the patient on Alphagan to decrease the pressure in the eye.  Patient says that this did help.  Patient was recently going to a routine exam for his eyes.  At that time he had no symptoms.  This was 3 weeks ago.  During this follow-up they found that the left eye had swelling and inflammation of the optic nerve.  At that time he was diagnosed with nonarteritic ischemic neuropathy.  He did see Dr. Hassell Done who is a neuro-ophthalmologist.  He was placed on Solu-Medrol and was to return back to the office this Wednesday.  Patient has had back issues since 2018. However, 2 weeks ago he noted that he became bedbound secondary to severe pain in his back with any movement.  He went to see Dr. Trevor Mace PA who was going to send him to physiotherapy or physical therapy.  Patient states when he is taking pain medications, his pain is a 5/10; however, if he is not taking pain medications his pain is a 10/10.  The original reason that patient came in today was that he noted over the last few days he was unable to move his arms other than grip and bicep flexion.  He states that this is not due to weakness per se, but is secondary to severe pain induced by movement, especially at the shoulders. Due to this problem he called a friend who stated that he was definitely going to go  to the ED and called 911 for him.  In addition, he has had symptoms of tingling in his perineum and has had no bowel movements in 2 weeks, which he attributes to decreased po intake. His po intake has declined due to inability to eat as moving his limbs in the process of preparing food is too painful. He denies bowel or bladder incontinence.  ROS: A 14 point ROS was performed and is negative except as noted in the HPI.  Past Medical History:  Diagnosis Date  . Atrial fibrillation ([place]) 03/22/2009   a. s/p multiple DCCV; b. no coumadin due to low TE risk profile; c. Tikosyn Rx  . Coronary atherosclerosis of native coronary artery 11/2002   a. s/p stent to LAD 12/03; OM2 occluded at cath 12/03; d. myoview 5/10: no ischemia;  e. echo 7/11: EF 55%, BAE, mild RVE, PASP 41-45; Myoview was in March 2013. There was no ischemia or infarction, EF 51%   . Cutaneous abscess of back excluding buttocks 07/04/2014   Appears to stem from possibly a cyst very large area 6 cm contact surgeon office   . Diabetes mellitus without complication ([place])   . Drusen body    see opth note  . ERECTILE DYSFUNCTION 03/22/2009  . GERD 03/22/2009  . HYPERGLYCEMIA 04/25/2010  . HYPERLIPIDEMIA 03/22/2009  . 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 LATERAL EPICONDYLITIS, LEFT 10/24/2009  . LIVER FUNCTION  TESTS, ABNORMAL 04/25/2010  . Local reaction to immunization 05/05/2012   minor resolving  zostavax   . Myocardial infarction ([place]) mi2003  . Numbness in left leg    foot related to back disease and surgery  . Obesity, unspecified 04/24/2009  . Perforated appendicitis with necrosis s/p open appendectomy 06/07/14 06/04/2014  . Renal cyst    Characterized by MRI as simple  . Ruptured suppurative appendicitis    2015   . SLEEP APNEA, OBSTRUCTIVE 03/22/2009   compliant with CPAP  . THROMBOCYTOPENIA 08/16/2010  . TOBACCO USE, QUIT 10/24/2009  . 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 Umbilical hernia       Family History  Problem Relation Age of Onset  . Thyroid disease Mother   . Ovarian cancer Mother   . Breast cancer Mother   . Lung cancer Father   . Cancer Father     Social History:   reports that he quit smoking about 10 years ago. His smoking use included cigarettes. He has a 84.00 pack-year smoking history. He has never used smokeless tobacco. He reports that he does not drink alcohol or use drugs.  Medications  Current Facility-Administered Medications:  .  acetaminophen (TYLENOL) tablet 650 mg, 650 mg, Oral, Q6H PRN **OR** acetaminophen (TYLENOL) suppository 650 mg, 650 mg, Rectal, Q6H PRN, Karmen Bongo, MD .  atorvastatin (LIPITOR) tablet 80 mg, 80 mg, Oral, QHS, Karmen Bongo, MD .  bisacodyl (DULCOLAX) EC tablet 5 mg, 5 mg, Oral, Daily PRN, Karmen Bongo, MD .  brimonidine (ALPHAGAN) 0.15 % ophthalmic solution 1 drop, 1 drop, Both Eyes, TID, Karmen Bongo, MD .  cyclobenzaprine (FLEXERIL) tablet 10 mg, 10 mg, Oral, TID PRN, Karmen Bongo, MD .  docusate sodium (COLACE) capsule 100 mg, 100 mg, Oral, BID, Karmen Bongo, MD .  dofetilide [facility]) capsule 500 mcg, 500 mcg, Oral, BID, Karmen Bongo, MD .  fluticasone furoate-vilanterol (BREO ELLIPTA) 200-25 MCG/INH 1 puff, 1 puff, Inhalation, Daily PRN, Karmen Bongo, MD .  insulin aspart (novoLOG) injection 0-15 Units, 0-15 Units, Subcutaneous, TID WC, Karmen Bongo, MD .  insulin aspart (novoLOG) injection 0-5 Units, 0-5 Units, Subcutaneous, QHS, Karmen Bongo, MD .  ipratropium-albuterol (DUONEB) 0.5-2.5 (3) MG/3ML nebulizer solution 3 mL, 3 mL, Nebulization, Q6H, Karmen Bongo, MD, 3 mL at 08/27/19 1447 .  lactated ringers infusion, , Intravenous, Continuous, Karmen Bongo, MD, Last Rate: 75 mL/hr at 08/27/19 1446 .  [START ON 08/28/2019] loratadine (CLARITIN) tablet 10 mg, 10 mg, Oral, Daily, Karmen Bongo, MD .  LORazepam (ATIVAN) injection 1 mg, 1 mg, Intravenous, PRN, Karmen Bongo, MD .   ondansetron [facility]) tablet 4 mg, 4 mg, Oral, Q6H PRN **OR** ondansetron (ZOFRAN) injection 4 mg, 4 mg, Intravenous, Q6H PRN, Karmen Bongo, MD .  oxyCODONE-acetaminophen (PERCOCET/ROXICET) 5-325 MG per tablet 1 tablet, 1 tablet, Oral, Q4H PRN, Karmen Bongo, MD, 1 tablet at 08/27/19 1619 .  [START ON 08/28/2019] pantoprazole (PROTONIX) EC tablet 40 mg, 40 mg, Oral, Daily, Karmen Bongo, MD .  polyethylene glycol (MIRALAX / GLYCOLAX) packet 17 g, 17 g, Oral, Daily PRN, Karmen Bongo, MD .  sodium phosphate (FLEET) 7-19 GM/118ML enema 1 enema, 1 enema, Rectal, Once PRN, Karmen Bongo, MD .  Suvorexant TABS 1 tablet, 1 tablet, Oral, QHS, Karmen Bongo, MD   Exam: Current vital signs: BP 134/80 (BP Location: Right Arm)   Pulse (!) 110   Temp 98.7 F (37.1 C) (Oral)   Resp (!) 23   SpO2 93%  Vital signs in last 24 hours:  Temp:  [98 F (36.7 C)-98.7 F (37.1 C)] 98.7 F (37.1 C) (08/28 1342) Pulse Rate:  [93-110] 110 (08/28 1342) Resp:  [13-24] 23 (08/28 1245) BP: (122-134)/(78-89) 134/80 (08/28 1342) SpO2:  [88 %-97 %] 93 % (08/28 1342)  Physical Exam  Constitutional: Appears well-developed and well-nourished.  Psych: Affect appropriate to situation Eyes: No scleral injection HENT: No OP obstrucion Head: Normocephalic.  Cardiovascular: Normal rate and regular rhythm.  Respiratory: Effort normal, non-labored breathing GI: Soft.  No distension. There is no tenderness.  Skin: WDI  Neuro: Mental Status: Mental status is intact to complex commands and questions Cranial Nerves: II: Patient has a 20/400 vision in the right eye and, 20/100 in the left eye. 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 V: Facial sensation is symmetric to temperature VII: Facial movement is symmetric.  VIII: hearing is intact to voice X: Palat elevates symmetrically XI:  Shoulder shrug is symmetric but with discomfort. XII: tongue is midline without atrophy or fasciculations.  Motor: Bilateral lower extremities have 5/5 strength at the hips, but with decreased ROM secondary to severe pain. Pain also limits ROM at knee, with strength relatively preserved at 4+/5 in the hamstrings and quadriceps. Minimal to no pain with ankle movement rated as 5/5 in dorsiflexion and plantar flexion.  In addition to focal muscle pain, some of the above movements are associated with transient spikes of severe back pain.   Upper extremity grip strength is 5/5 bilaterally.  Bicep bilaterally is 4/5 with tricep being 3/5 and no notable movement secondary to pain with shoulder abduction. The patient states the weakness is due to severe pain with voluntary muscle contraction.  There is tenderness to palpation bilateral trapezii, deltoids, biceps and triceps, as well as hip girdle and quadriceps bilaterally. Flexion of the neck results in severe posterior neck pain, with resistance met to passive neck flexion when pain is triggered. Patient has significant pain with palpation to the trapezius, pectoralis and left sternocleidomastoid. Sensory: Sensation is equal in upper extremities but does have patchy areas of loss of sensation, lower extremities are also with patchy sensory loss to temperature.  Deep Tendon Reflexes: Knee jerk is trace bilaterally with delayed response both on the right and the left, no notable ankle jerk, bilateral upper extremity has 2+ brachioradialis and 2+ biceps  Plantars:  Downgoing bilaterally  Cerebellar: Did not test secondary to pain Gait: Not assessed due to severe pain.   Labs I have reviewed labs in epic and the results pertinent to this consultation are:   CBC    Component Value Date/Time   WBC 12.0 (H) 08/27/2019 0908   RBC 6.29 (H) 08/27/2019 0908   HGB 19.4 (H) 08/27/2019 0908   HGB 15.5 02/01/2019 1556   HGB 16.3 03/12/2011 0948   HCT 56.4 (H)  08/27/2019 0908   HCT 44.4 02/01/2019 1556   HCT 46.6 03/12/2011 0948   PLT 103 (L) 08/27/2019 0908   PLT 167 02/01/2019 1556   MCV 89.7 08/27/2019 0908   MCV 90 02/01/2019 1556   MCV 90.9 03/12/2011 0948   MCH 30.8 08/27/2019 0908   MCHC 34.4 08/27/2019 0908   RDW 13.4 08/27/2019 0908   RDW 12.6 02/01/2019 1556   RDW 13.1 03/12/2011 0948   LYMPHSABS 0.8 08/27/2019 0908   LYMPHSABS 3.4 (H) 12/25/2017 0937   LYMPHSABS 2.7 03/12/2011  0948   MONOABS 1.2 (H) 08/27/2019 0908   MONOABS 0.6 03/12/2011 0948   EOSABS 0.1 08/27/2019 0908   EOSABS 0.6 (H) 12/25/2017 0937   BASOSABS 0.0 08/27/2019 0908   BASOSABS 0.0 12/25/2017 0937   BASOSABS 0.0 03/12/2011 0948    CMP     Component Value Date/Time   NA 135 08/27/2019 0908   NA 141 02/01/2019 1556   K 4.3 08/27/2019 0908   CL 99 08/27/2019 0908   CO2 21 (L) 08/27/2019 0908   GLUCOSE 173 (H) 08/27/2019 0908   BUN 36 (H) 08/27/2019 0908   BUN 16 02/01/2019 1556   CREATININE 0.97 08/27/2019 0908   CREATININE 0.90 07/31/2016 1318   CALCIUM 10.1 08/27/2019 0908   PROT 7.5 08/27/2019 0908   PROT 6.8 12/25/2017 0937   ALBUMIN 3.5 08/27/2019 0908   ALBUMIN 4.5 12/25/2017 0937   AST 20 08/27/2019 0908   ALT 48 (H) 08/27/2019 0908   ALKPHOS 103 08/27/2019 0908   BILITOT 1.7 (H) 08/27/2019 0908   BILITOT 0.6 12/25/2017 0937   GFRNONAA >60 08/27/2019 0908   GFRNONAA >89 08/24/2015 0800   GFRAA >60 08/27/2019 0908   GFRAA >89 08/24/2015 0800    Lipid Panel     Component Value Date/Time   CHOL 85 02/08/2019 0806   CHOL 95 (L) 12/25/2017 0937   TRIG 110.0 02/08/2019 0806   HDL 30.00 (L) 02/08/2019 0806   HDL 44 12/25/2017 0937   CHOLHDL 3 02/08/2019 0806   VLDL 22.0 02/08/2019 0806   LDLCALC 33 02/08/2019 0806   LDLCALC 30 12/25/2017 0937     Imaging I have reviewed the images obtained:  MRI examination of the brain/lumbar spine/thoracic spine/cervical spine with and without contrast to be obtained  Etta Quill  PA-C [facility] [phone] 08/27/2019, 4:28 PM    Assessment:  This is a 68 year old male with multiple medical issues including 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 His second primary neurological complaint consists of severe muscle pain in upper and lower extremities as well as his neck which limit mobility and has resulted in being bedridden with decreased PO intake over the past 2 weeks. He has an elevated BUN:Cr ratio consistent with his stated decreased PO intake. He presented to the emergency department primarily for evaluation of new onset of loss of strength and pain in the upper extremities, lower extremities and lower back.   1. DDx for vision loss and weakness includes NMO and MS. DDx for muscle pain and stiffness includes stiff person syndrome (SPS), dermatomyositis and severe polymyalgia rheumatica (this could be associated with temporal arteritis, but he has no complaints of temporal pain). The DDx for his severe low back pain, which is worse than the muscle pain in his limbs, includes SPS, discitis/osteomyelitis and severe spinal stenosis. 2. 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 3. 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 SPS is characterized by fluctuating muscle rigidity in the trunk and limbs and a heightened sensitivity to stimuli such as noise, touch, and emotional distress, which can set  off muscle spasm. Anti-glutamic acid decarboxylase Ab is elevated in 2 out of 3 SPS patients. Treatment options include IVIG, PLEX, muscle relaxants and  benzodiazepines. 4. Knee jerk was trace bilaterally with delayed response both on the right and the left, which was suggestive of possible hypothyroidism. However, TSH is normal.    Recommendations: - Agree with MRI of brain, cervical spine, thoracic spine and lumbar spine with and without contrast.  - CK and myoglobin levels, which have been ordered -- Anti-glutamic acid decarboxylase antibody titer (anti-GAD). This has been ordered.  -- ESR, ANA and c-reactive protein.  -- IV hydration - We will hold off on LP and await initial diagnostic tests first.  I have seen and examined the patient. I have formulated the assessment and plan. My exam findings were observed and documented by Etta Quill, PA. Electronically signed: Dr. Kerney Elbe

## 2019-08-27 NOTE — Progress Notes (Signed)
Called pt's wife Sonia Side [phone] with pt's permission (number in demographics as well) and updated her on pt's condition and plan of care. Wife is disabled and unable to visit but said their son might tomorrow. I explained the visitation policy to wife. Son's number and name also on chart as someone pt wants info given to. Wife grateful for call.

## 2019-08-27 NOTE — H&P (Signed)
History and Physical    [name] [name]. KWI:[id number] DOB: 09-15-1951 DOA: 08/27/2019  PCP: Burnis Medin, MD Consultants:  Rita Ohara - neurosurgery, last seen 20 years ago; [name] - orthopedics; Sood - pulmonary; Crenshaw - cardiology Patient coming from:  Home - lives with disabled wife; NOK: Pandora Leiter, (and wife), [phone]  Chief Complaint: Back pain  HPI: [name] [name]. is a 68 y.o. male with medical history significant of OSA on CPAP; perforated appy (2015); obesity; CAD s/p stents; HLD; DM; optic nerve neuritis; and afib on Pradaxa presenting with back pain.  At the beginning of August, he saw his eye doctor and had left optic nerve inflammation (had h/o R eye issue similarly) - he was admitted to [facility] for steroids and then had them transitioned to PO for 2 weeks.  After he was discharged, he developed RLE cellulitis.  He called his PCP and was placed on antibiotic, Doxycycline.  He finished the antibiotic and steroid and around that time noticed back pain in the mid back around his waist.  He had been walking 2 miles a day, then 1, then unable due to worsening pain.  He saw orthopedics, had a negative xray.  He was offered an injection but opted for PT instead.  2 days later, the pain has escalated and he came to the ER 1 week ago.  He had a CTA and xray, discovered a small aneurysm.  He took a muscle relaxer and pain medication with mild pain control.  He is unable to walk, lies flat on his back, minimal PO intake, using a urinal due to inability to walk.  He has lost maybe 20 pounds.  The inability to walk is due to such severe pain, not because he is weak.  No night sweats.  No fevers.  No urinary symptoms other than occasional testicular/bladder discomfort.  No urinary or fecal incontinence.  Last BM was maybe 2 weeks ago.  This AM, he noticed inability to move his arms.  Pain under clavicles and in pectoralis muscles up into neck on both sides.  He has a h/o NAION on the right in  7/19.  He was readmitted 8/5-6 at [facility] with B atrophic optic nerves and ?subtle wispy enhancement of optic nerves; he was treated with Solumedrol 500 mg IV x 2 -> Prednisone 60 mg daily.  He does not appear to have been seen by neurology during either presentation.    ED Course:  Acute on chronic back pain (surgery 1998), no issues recently.  A month ago with optic edema, treated with high-dose steroids.  After that, LLE cellulitis treated outpatient and resolved.  Had onset of acute LBP, worsening pain.  Unable to get out of bed.  Radiation of pain into B shoulders, CTA with small thoracic aneurysm.  On pain meds but still worsening pain.  Not weakness, just severe pain.  No fever.  ?dry, given IVF.  Likely needs back MRI.  Blood cultures pending.  Likely needs MRI with anesthesia.  ?ortho vs. Neurosurgery but needs MRI first.  Review of Systems: As per HPI; otherwise review of systems reviewed and negative.   Ambulatory Status:  Ambulated without assistance prior to onset of symptoms  Past Medical History:  Diagnosis Date  . Atrial fibrillation ([place]) 03/22/2009   a. s/p multiple DCCV; b. no coumadin due to low TE risk profile; c. Tikosyn Rx  . Coronary atherosclerosis of native coronary artery 11/2002   a. s/p stent to LAD 12/03; OM2  occluded at cath 12/03; d. myoview 5/10: no ischemia;  e. echo 7/11: EF 55%, BAE, mild RVE, PASP 41-45; Myoview was in March 2013. There was no ischemia or infarction, EF 51%   . Cutaneous abscess of back excluding buttocks 07/04/2014   Appears to stem from possibly a cyst very large area 6 cm contact surgeon office   . Diabetes mellitus without complication ([place])   . Drusen body    see opth note  . ERECTILE DYSFUNCTION 03/22/2009  . GERD 03/22/2009  . HYPERGLYCEMIA 04/25/2010  . HYPERLIPIDEMIA 03/22/2009  . 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 LATERAL EPICONDYLITIS, LEFT 10/24/2009  . LIVER FUNCTION TESTS, ABNORMAL 04/25/2010  .  Local reaction to immunization 05/05/2012   minor resolving  zostavax   . Myocardial infarction ([place]) mi2003  . Numbness in left leg    foot related to back disease and surgery  . Obesity, unspecified 04/24/2009  . Perforated appendicitis with necrosis s/p open appendectomy 06/07/14 06/04/2014  . Renal cyst    Characterized by MRI as simple  . Ruptured suppurative appendicitis    2015   . SLEEP APNEA, OBSTRUCTIVE 03/22/2009   compliant with CPAP  . THROMBOCYTOPENIA 08/16/2010  . TOBACCO USE, QUIT 10/24/2009  . 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 Umbilical hernia     Past Surgical History:  Procedure Laterality Date  . APPENDECTOMY N/A 06/06/2014   Procedure: APPENDECTOMY;  Surgeon: Odis Hollingshead, MD;  Location: [facility];  Service: General;  Laterality: N/A;  . COLON SURGERY    . COLONOSCOPY  11/15/2011   Procedure: COLONOSCOPY;  Surgeon: Juanita Craver, MD;  Location: [facility];  Service: Endoscopy;  Laterality: N/A;  . COLONOSCOPY WITH PROPOFOL N/A 01/03/2017   Procedure: COLONOSCOPY WITH PROPOFOL;  Surgeon: Carol Ada, MD;  Location: [facility];  Service: Endoscopy;  Laterality: N/A;  . cyst on epiglottis  08/2002  . ESOPHAGOGASTRODUODENOSCOPY  11/15/2011   Procedure: ESOPHAGOGASTRODUODENOSCOPY (EGD);  Surgeon: Juanita Craver, MD;  Location: [facility];  Service: Endoscopy;  Laterality: N/A;  . LAPAROSCOPIC APPENDECTOMY N/A 06/06/2014   Procedure: APPENDECTOMY LAPAROSCOPIC attemted;  Surgeon: Odis Hollingshead, MD;  Location: [facility];  Service: General;  Laterality: N/A;  . LUMBAR DISC SURGERY     two  holes in spinalcovering  . stent     LAD DUS 2004  . surgery l4-l5   1998   ruptured x 3  . ulnar neuropathy    . UMBILICAL HERNIA REPAIR     mesh    Social History   Socioeconomic History  . Marital status: Married    Spouse name: Not on file  . Number of children: Not on file  . Years of education: Not on file  . Highest education level: Not on file  Occupational History   . Not on file  Social Needs  . Financial resource strain: Not on file  . Food insecurity    Worry: Not on file    Inability: Not on file  . Transportation needs    Medical: Not on file    Non-medical: Not on file  Tobacco Use  . Smoking status: Former Smoker    Packs/day: 2.00    Years: 42.00    Pack years: 84.00    Types: Cigarettes    Quit date: 12/30/2008    Years since quitting: 10.6  . Smokeless tobacco: Never Used  . Tobacco comment: started at age 40; 1-2 ppd; quit 2010  Substance and Sexual Activity  . Alcohol use: No    Alcohol/week: 0.0 standard drinks    Comment: rarely; maybe 1 beer a year  . Drug use: No  . Sexual activity: Yes  Lifestyle  . Physical activity    Days per week: Not on file    Minutes per session: Not on file  . Stress: Not on file  Relationships  . Social Herbalist on phone: Not on file    Gets together: Not on file    Attends religious service: Not on file    Active member of club or organization: Not on file    Attends meetings of clubs or organizations: Not on file    Relationship status: Not on file  . Intimate partner violence    Fear of current or ex partner: Not on file    Emotionally abused: Not on file    Physically abused: Not on file    Forced sexual activity: Not on file  Other Topics Concern  . Not on file  Social History Narrative   Retired paramedic   Regular exercise-yes not recently    Has children   Wife is overweight and has fibromyalgia and depression on disability doesn't go out much. Back surgery    Has older dog   Retired from [employer] 31 years of service now 7 years    Caffeine- minimal    Allergies  Allergen Reactions  . Novocain [Procaine]   . Penicillins     Childhood reaction-details unknown  . Pseudoephedrine Other (See Comments)    Patient went into afib  . Sulfonamide Derivatives     Childhood reaction   . Cardizem [Diltiazem] Rash  . Pseudoephedrine Hcl Palpitations    Family  History  Problem Relation Age of Onset  . Thyroid disease Mother   . Ovarian cancer Mother   . Breast cancer Mother   . Lung cancer Father   . Cancer Father     Prior to Admission medications   Medication Sig Start Date End Date Taking? Authorizing Provider  acetaminophen (TYLENOL) 325 MG tablet Take 650 mg by mouth every 6 (six) hours as needed for mild pain.   Yes [provider]  albuterol (PROVENTIL HFA;VENTOLIN HFA) 108 (90 Base) MCG/ACT inhaler Inhale 2 puffs into the lungs every 6 (six) hours as needed for wheezing or shortness of breath. 03/24/17  Yes Panosh, Standley Brooking, MD  ALPHAGAN P 0.1 % SOLN Place 1 drop into both eyes 3 (three) times daily.  07/20/18  Yes [provider]  atorvastatin (LIPITOR) 80 MG tablet TAKE 1 TABLET BY MOUTH  DAILY Patient taking differently: Take 80 mg by mouth at bedtime.  03/11/19  Yes Panosh, Standley Brooking, MD  cyclobenzaprine (FLEXERIL) 10 MG tablet Take 1 tablet (10 mg total) by mouth 3 (three) times daily as needed for muscle spasms. 08/17/19  Yes Pete Pelt, PA-C  diclofenac sodium (VOLTAREN) 1 % GEL Apply 4 g topically 4 (four) times daily. 08/20/19  Yes Palumbo, April, MD  diphenhydrAMINE (BENADRYL) 25 MG tablet Take 25 mg by mouth at bedtime as needed for sleep.   Yes [provider]  dofetilide (TIKOSYN) 500 MCG capsule Take 1 capsule (500 mcg total) by mouth 2 (two) times daily. 07/05/19  Yes Lelon Perla, MD  famotidine (PEPCID) 10 MG tablet Take 10 mg by mouth as needed for heartburn.    Yes [provider]  fexofenadine (ALLEGRA) 180 MG tablet Take 180  mg by mouth daily.   Yes [provider]  fish oil-omega-3 fatty acids 1000 MG capsule Take 1 g by mouth 2 (two) times daily.    Yes [provider]  fluticasone (FLONASE) 50 MCG/ACT nasal spray Place 2 sprays into both nostrils daily as needed for allergies. 05/05/19  Yes Panosh, Standley Brooking, MD  fluticasone furoate-vilanterol (BREO ELLIPTA) 200-25  MCG/INH AEPB Inhale 1 puff into the lungs daily as needed (shortness of breath).    Yes [provider]  HYDROcodone-acetaminophen (NORCO) 5-325 MG tablet Take 1 tablet by mouth every 4 (four) hours as needed for moderate pain. One to two tabs every 4-6 hours for pain 08/17/19  Yes Carlis Abbott, Gillermo Murdoch, PA-C  KLOR-CON M20 20 MEQ tablet TAKE 1 TABLET BY MOUTH EVERY DAY Patient taking differently: Take 20 mEq by mouth daily.  06/03/19  Yes Lelon Perla, MD  lidocaine (LIDODERM) 5 % Place 1 patch onto the skin daily. Remove & Discard patch within 12 hours or as directed by MD 08/20/19  Yes Palumbo, April, MD  Magnesium (V-R MAGNESIUM) 250 MG TABS Take 250 mg by mouth at bedtime. Take with 515m tablet to equal 7564m   Yes [provider]  Magnesium Oxide 500 MG TABS Take 500 mg by mouth at bedtime. Take with 25065mablet to equal 750m52mYes [provider]  metFORMIN (GLUCOPHAGE-XR) 500 MG 24 hr tablet Take as directed Patient taking differently: Take 500 mg by mouth 3 (three) times daily.  07/21/19  Yes Panosh, WandStandley Brooking  MULTIPLE VITAMIN PO Take 1 tablet by mouth every evening.    Yes [provider]  ondansetron (ZOFRAN ODT) 4 MG disintegrating tablet Take 1 tablet (4 mg total) by mouth every 8 (eight) hours as needed for nausea or vomiting. 09/02/18  Yes Panosh, WandStandley Brooking  oxyCODONE-acetaminophen (PERCOCET/ROXICET) 5-325 MG tablet Take 1 tablet by mouth every 4 (four) hours as needed for severe pain. 08/24/19  Yes Panosh, WandStandley Brooking  pantoprazole (PROTONIX) 40 MG tablet Take 40 mg by mouth daily. 08/05/19  Yes [provider]  PRADAXA 150 MG CAPS capsule TAKE 1 CAPSULE BY MOUTH  EVERY 12 HOURS Patient taking differently: Take 150 mg by mouth 2 (two) times daily.  03/11/19  Yes Panosh, WandStandley Brooking  predniSONE (DELTASONE) 20 MG tablet Take 3,3,3,2,2,2,1,1,1, 1/.2 1./2 1/.2 pills po qd taper 08/24/19  Yes Panosh, WandStandley Brooking  Suvorexant (BELSOMRA) 20 MG TABS  Take 1 tablet by mouth at bedtime. 05/05/19  Yes Panosh, WandStandley Brooking  tamsulosin (FLOMAX) 0.4 MG CAPS capsule TAKE 1 CAPSULE BY MOUTH EVERY DAY Patient taking differently: Take 0.4 mg by mouth daily as needed (repeat kidney stone).  08/21/18  Yes Panosh, WandStandley Brooking  triamcinolone cream (KENALOG) 0.1 % Apply 1 application topically 2 (two) times daily. As needed. Patient taking differently: Apply 1 application topically 2 (two) times daily as needed (rash).  02/12/19  Yes Panosh, WandStandley Brooking  nitroGLYCERIN (NITROSTAT) 0.4 MG SL tablet Place 1 tablet (0.4 mg total) under the tongue every 5 (five) minutes as needed for chest pain. 02/01/19   CrenLelon Perla  [facility] Center[facility] test strip USE TWICE A DAY 02/03/19   PanoBurnis Medin    Physical Exam: Vitals:   08/27/19 1030 08/27/19 1045 08/27/19 1245 08/27/19 1342  BP: 124/80 122/87 134/83 134/80  Pulse: (!) 101 (!) 103 (!) 107 (!) 110  Resp: 19 15 (!)  23   Temp:    98.7 F (37.1 C)  TempSrc:    Oral  SpO2: 91% 94% 97% 93%     . General:  Appears very uncomfortable, lying very supine . Eyes:   EOMI, normal lids, iris . ENT:  grossly normal hearing, lips & tongue, mmm; poor dentition . Neck:  no LAD, masses or thyromegaly; no carotid bruits . Cardiovascular:  RR with mild tachycardia, no m/r/g. No LE edema.  [name] Respiratory:   CTA bilaterally with no wheezes/rales/rhonchi.  Normal respiratory effort. . Abdomen:  soft, NT, ND, NABS . Skin:  no rash or induration seen on limited exam . Musculoskeletal:  Decreased strength of all extremities, proximal > distal.  Marked TTP of the anterior trapezius muscles L > R . Psychiatric:  blunted mood and affect, speech fluent and appropriate, AOx3 . Neurologic:  CN 2-12 grossly intact, moves all extremities in coordinated fashion but diffusely weak more proximally than distally, sensation intact, appears to have markedly diminished LE reflexes - ?areflexic   Radiological Exams on Admission: No results  found.  EKG: Independently reviewed.  Sinus tachcyardia with rate 106; nonspecific ST changes with no evidence of acute ischemia; NSCSLT   Labs on Admission: I have personally reviewed the available labs and imaging studies at the time of the admission.  Pertinent labs:   CO2 21 Glucose 173 BUN 36/Creatinine 0.97/GFR >60 AST 20/ALT 48/Bili 1.7 WBC 12.0 Hgb 19.4 Platelets 103 CRP 1.3 ESR 8 COVID pending   Assessment/Plan Principal Problem:   Myelopathy (HCC) Active Problems:   Hyperlipidemia   Obstructive sleep apnea   Type 2 diabetes mellitus with other circulatory complications (HCC)   Atrial fibrillation (HCC)   H/O optic neuritis   Myelopathy with h/o optic neuritis -Patient with h/o R optic neuritis in 7/19 and left on 08/04/19, treated with steroids -Following steroids (also antibiotics for cellulitis, but this seems to be unrelated), patient developed acute, severe progressive back pain with decreased mobility - went from walking 2 miles a day to bedbound -He has proximal muscle weakness/pain > distal -He was seen in the ER on 8/20 and noted to have an incidental thoracic aneurysm (4.1 cm, needs semi-annual imaging f/u) and 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 but CTA was non-diagnostic and he was discharged  -He had a televisit with his PCP on 8/25 and was told he has severe spinal stenosis and to f/u with neurosurgery -By today, he noticed shoulder girdle involvement and returned to the ER -Imaging was not performed in the ER -On my exam, he appeared to have proximal muscle weakness (perhaps related to pain rather than weakness), TTP with light touch of the anterior trapezius muscles, and hyporeflexia -These symptoms, in conjunction with h/o optic neuritis, bring significant concern for an ascending paralysis with ophthalmic features such as GBS or neuromyelitis optica -Neurology has been consulted and STAT MRI brain, C/T/L spine ordered -He needs an LP, but is on Pradaxa  and so may not be able to have this for some time - will place an IR order with the understanding that it may take time for the Pradaxa to wear off -Based on his inability to get OOB, strict bed rest for now pending further evaluation and treatment -Will admit to progressive care unit - he appears likely to need perhaps prolonged inpatient hospitalization given severity of condition (initially placed in Med Surg but more consideration led to the realization that the patient needs more intensive care plan) and his inability to move  from a supine position at this time -NIF and VC q shift in case he develops diaphragmatic involvement -He appears likely to need PT/OT and possibly CIR, but currently is not appropriately stabilized for therapy evaluation.  Dehydration -He does not have AKI but appears to be hemoconcentrated with elevated Hgb and elevated BUN in the setting of decreased PO intake due to difficulty with mobility -Will provide IVF for now  Afib on Pradaxa -On Tikosyn for rate control -Hold Pradaxa in anticipation of need for LP  DM -Recent A1c was 6.7 on 7/22 -hold Glucophage -Cover with moderate-scale SSI  HLD -Continue Lipitor  OSA -Hold CPAP for now, pending neurology input   Note: This patient has been tested and is pending for the novel coronavirus COVID-19.  DVT prophylaxis:  SCDs Code Status:  Full - confirmed with patient (retired Software engineer) Family Communication: None present Disposition Plan:  To be determined Consults called: Neurology; IR Admission status: Admit - It is my clinical opinion that admission to INPATIENT is reasonable and necessary because of the expectation that this patient will require hospital care that crosses at least 2 midnights to treat this condition based on the medical complexity of the problems presented.  Given the aforementioned information, the predictability of an adverse outcome is felt to be significant.    Karmen Bongo MD [facility]   How to contact the [facility] Attending or Consulting provider Nathalie or covering provider during after hours [place], for this patient?  1. Check the care team in [facility] and look for a) attending/consulting [facility] provider listed and b) the [facility] team listed 2. Log into [email or web address] and use [place]'s universal password to access. If you do not have the password, please contact the hospital operator. 3. Locate the [facility] provider you are looking for under [facility] and page to a number that you can be directly reached. 4. If you still have difficulty reaching the provider, please page the [facility] (Director on Call) for the Hospitalists listed on amion for assistance.   08/27/2019, 2:10 PM

## 2019-08-27 NOTE — ED Triage Notes (Signed)
Pt presents with c/o increasing low back pain and inability to get out of bed or eat and drink.  Recent diagnosis of triple A and was sent home for f/u after discharge.  Pt unaable to move arms to undress on arrival d/t pain.

## 2019-08-27 NOTE — ED Notes (Signed)
Placed on 2 L/[place] after Dilaudid.  SAO25 to 87%.  Remains alert and talkative.

## 2019-08-28 ENCOUNTER — Inpatient Hospital Stay (HOSPITAL_COMMUNITY): Payer: Medicare Other

## 2019-08-28 DIAGNOSIS — R7881 Bacteremia: Secondary | ICD-10-CM

## 2019-08-28 DIAGNOSIS — G729 Myopathy, unspecified: Secondary | ICD-10-CM

## 2019-08-28 DIAGNOSIS — B9561 Methicillin susceptible Staphylococcus aureus infection as the cause of diseases classified elsewhere: Secondary | ICD-10-CM

## 2019-08-28 DIAGNOSIS — Z882 Allergy status to sulfonamides status: Secondary | ICD-10-CM

## 2019-08-28 DIAGNOSIS — I4891 Unspecified atrial fibrillation: Secondary | ICD-10-CM

## 2019-08-28 DIAGNOSIS — M549 Dorsalgia, unspecified: Secondary | ICD-10-CM

## 2019-08-28 DIAGNOSIS — Z888 Allergy status to other drugs, medicaments and biological substances status: Secondary | ICD-10-CM

## 2019-08-28 DIAGNOSIS — Z880 Allergy status to penicillin: Secondary | ICD-10-CM

## 2019-08-28 DIAGNOSIS — G4733 Obstructive sleep apnea (adult) (pediatric): Secondary | ICD-10-CM

## 2019-08-28 DIAGNOSIS — M4640 Discitis, unspecified, site unspecified: Secondary | ICD-10-CM

## 2019-08-28 DIAGNOSIS — Z8669 Personal history of other diseases of the nervous system and sense organs: Secondary | ICD-10-CM

## 2019-08-28 DIAGNOSIS — Z872 Personal history of diseases of the skin and subcutaneous tissue: Secondary | ICD-10-CM

## 2019-08-28 DIAGNOSIS — Z884 Allergy status to anesthetic agent status: Secondary | ICD-10-CM

## 2019-08-28 DIAGNOSIS — E1159 Type 2 diabetes mellitus with other circulatory complications: Secondary | ICD-10-CM

## 2019-08-28 DIAGNOSIS — Z87891 Personal history of nicotine dependence: Secondary | ICD-10-CM

## 2019-08-28 DIAGNOSIS — E119 Type 2 diabetes mellitus without complications: Secondary | ICD-10-CM

## 2019-08-28 LAB — BLOOD CULTURE ID PANEL (REFLEXED)

## 2019-08-28 LAB — CBC
HCT: 51.2 % (ref 39.0–52.0)
Hemoglobin: 17.9 g/dL — ABNORMAL HIGH (ref 13.0–17.0)
MCH: 31.3 pg (ref 26.0–34.0)
MCHC: 35 g/dL (ref 30.0–36.0)
MCV: 89.5 fL (ref 80.0–100.0)
Platelets: 91 10*3/uL — ABNORMAL LOW (ref 150–400)
RBC: 5.72 MIL/uL (ref 4.22–5.81)
RDW: 13.7 % (ref 11.5–15.5)
WBC: 15.8 10*3/uL — ABNORMAL HIGH (ref 4.0–10.5)
nRBC: 0 % (ref 0.0–0.2)

## 2019-08-28 LAB — HEPATIC FUNCTION PANEL
ALT: 40 U/L (ref 0–44)
AST: 22 U/L (ref 15–41)
Albumin: 2.9 g/dL — ABNORMAL LOW (ref 3.5–5.0)
Alkaline Phosphatase: 112 U/L (ref 38–126)
Bilirubin, Direct: 0.8 mg/dL — ABNORMAL HIGH (ref 0.0–0.2)
Indirect Bilirubin: 1 mg/dL — ABNORMAL HIGH (ref 0.3–0.9)
Total Bilirubin: 1.8 mg/dL — ABNORMAL HIGH (ref 0.3–1.2)
Total Protein: 6.1 g/dL — ABNORMAL LOW (ref 6.5–8.1)

## 2019-08-28 LAB — GLUCOSE, CAPILLARY
Glucose-Capillary: 219 mg/dL — ABNORMAL HIGH (ref 70–99)
Glucose-Capillary: 198 mg/dL — ABNORMAL HIGH (ref 70–99)
Glucose-Capillary: 183 mg/dL — ABNORMAL HIGH (ref 70–99)
Glucose-Capillary: 159 mg/dL — ABNORMAL HIGH (ref 70–99)

## 2019-08-28 LAB — BASIC METABOLIC PANEL
Anion gap: 13 (ref 5–15)
BUN: 37 mg/dL — ABNORMAL HIGH (ref 8–23)
CO2: 21 mmol/L — ABNORMAL LOW (ref 22–32)
Calcium: 9.3 mg/dL (ref 8.9–10.3)
Chloride: 97 mmol/L — ABNORMAL LOW (ref 98–111)
Creatinine, Ser: 1.48 mg/dL — ABNORMAL HIGH (ref 0.61–1.24)
GFR calc Af Amer: 55 mL/min — ABNORMAL LOW (ref >60)
GFR calc non Af Amer: 48 mL/min — ABNORMAL LOW (ref >60)
Glucose, Bld: 230 mg/dL — ABNORMAL HIGH (ref 70–99)
Potassium: 4.4 mmol/L (ref 3.5–5.1)
Sodium: 131 mmol/L — ABNORMAL LOW (ref 135–145)

## 2019-08-28 LAB — MYOGLOBIN, SERUM: Myoglobin: 28 ng/mL (ref 28–72)

## 2019-08-28 LAB — HIV ANTIBODY (ROUTINE TESTING W REFLEX): HIV Screen 4th Generation wRfx: NONREACTIVE

## 2019-08-28 LAB — MRSA PCR SCREENING: MRSA by PCR: NEGATIVE

## 2019-08-28 MED ORDER — DOFETILIDE 250 MCG PO CAPS
250.0000 ug | ORAL_CAPSULE | Freq: Two times a day (BID) | ORAL | Status: DC
  Administered 2019-08-28 – 2019-08-29 (×2): 250 ug via ORAL
  Filled 2019-08-28 (×2): qty 1

## 2019-08-28 MED ORDER — SODIUM CHLORIDE 0.9 % IV SOLN
INTRAVENOUS | Status: DC
  Administered 2019-08-28 – 2019-08-29 (×2): via INTRAVENOUS

## 2019-08-28 MED ORDER — MAGNESIUM SULFATE 2 GM/50ML IV SOLN
2.0000 g | Freq: Once | INTRAVENOUS | Status: AC
  Administered 2019-08-28: 2 g via INTRAVENOUS
  Filled 2019-08-28: qty 50

## 2019-08-28 MED ORDER — ACETAMINOPHEN 325 MG PO TABS
325.0000 mg | ORAL_TABLET | Freq: Once | ORAL | Status: DC
  Filled 2019-08-28: qty 1

## 2019-08-28 MED ORDER — GADOBUTROL 1 MMOL/ML IV SOLN
10.0000 mL | Freq: Once | INTRAVENOUS | Status: AC | PRN
  Administered 2019-08-28: 10 mL via INTRAVENOUS

## 2019-08-28 MED ORDER — ALBUTEROL SULFATE (2.5 MG/3ML) 0.083% IN NEBU: 2.5000 mg | INHALATION_SOLUTION | RESPIRATORY_TRACT | Status: DC | PRN

## 2019-08-28 MED ORDER — SODIUM CHLORIDE 0.9 % IV BOLUS
500.0000 mL | Freq: Once | INTRAVENOUS | Status: AC
  Administered 2019-08-28: 500 mL via INTRAVENOUS

## 2019-08-28 MED ORDER — ASPIRIN EC 81 MG PO TBEC
81.0000 mg | DELAYED_RELEASE_TABLET | Freq: Every day | ORAL | Status: DC
  Filled 2019-08-28: qty 1

## 2019-08-28 MED ORDER — METHOCARBAMOL 1000 MG/10ML IJ SOLN
1000.0000 mg | Freq: Three times a day (TID) | INTRAVENOUS | Status: AC
  Administered 2019-08-28 – 2019-08-30 (×6): 1000 mg via INTRAVENOUS
  Filled 2019-08-28 (×6): qty 10

## 2019-08-28 MED ORDER — CEFAZOLIN SODIUM-DEXTROSE 2-4 GM/100ML-% IV SOLN
2.0000 g | Freq: Three times a day (TID) | INTRAVENOUS | Status: DC
  Administered 2019-08-28 – 2019-08-31 (×10): 2 g via INTRAVENOUS
  Filled 2019-08-28 (×11): qty 100

## 2019-08-28 MED ORDER — IPRATROPIUM-ALBUTEROL 0.5-2.5 (3) MG/3ML IN SOLN
3.0000 mL | Freq: Three times a day (TID) | RESPIRATORY_TRACT | Status: DC
  Administered 2019-08-28 – 2019-09-03 (×18): 3 mL via RESPIRATORY_TRACT
  Filled 2019-08-28 (×20): qty 3

## 2019-08-28 NOTE — Progress Notes (Addendum)
Subjective: Has completed MRI.   Objective: Current vital signs: BP 129/79   Pulse (!) 115   Temp 99 F (37.2 C) (Oral)   Resp (!) 23   SpO2 93%  Vital signs in last 24 hours: Temp:  [97.8 F (36.6 C)-102.1 F (38.9 C)] 99 F (37.2 C) (08/29 0701) Pulse Rate:  [93-120] 115 (08/29 0807) Resp:  [13-27] 23 (08/29 0807) BP: (105-134)/(68-89) 129/79 (08/29 0701) SpO2:  [88 %-97 %] 93 % (08/29 0807) Weight:  [120.2 kg] 120.2 kg (08/29 0520)  Intake/Output from previous day: 08/28 0701 - 08/29 0700 In: 2179.8 [P.O.:300; I.V.:120.4; IV Piggyback:1759.3] Out: 450 [Urine:450] Intake/Output this shift: No intake/output data recorded. Nutritional status:  Diet Order            Diet Carb Modified Fluid consistency: Thin; Room service appropriate? Yes  Diet effective now             HEENT: [place]/AT Lungs: Respirations unlabored Ext: No edema  Neurologic Exam: Mental Status: Sedated affect after receiving Ativan for MRI. Disoriented today in the context of benzodiazepine sedation.  Cranial Nerves: EOMI. Face symmetric.   Motor: Bilateral lower extremities have 5/5 strength at the hips, but with decreased ROM secondary to severe pain. Pain also limits ROM at knee, with strength relatively preserved at 4+/5 in the hamstrings and quadriceps. Minimal to no pain with ankle movement rated as 5/5 in dorsiflexion and plantar flexion. In addition to focal muscle pain, some of the above movements are associated with transient spikes of severe back pain. Upper extremity grip strength is 5/5 bilaterally.  Bicep bilaterally is 4/5 with tricep being 3/5 and no notable movement secondary to pain with shoulder abduction. The patient states the weakness is due to severe pain with voluntary muscle contraction.  There is tenderness to palpation bilateral trapezii, deltoids, biceps and triceps, as well as hip girdle and quadriceps bilaterally. Flexion of the neck results in severe posterior neck pain, with  resistance met to passive neck flexion when pain is triggered. Patient has significant pain with palpation to the trapezius, pectoralis and left sternocleidomastoid. Sensory: No definite change from patchy sensory deficits seen yesterday.   Deep Tendon Reflexes: No definite interval change.  Cerebellar: Did not test secondary to pain Gait: Not assessed due to severe pain.   Lab Results: Results for orders placed or performed during the hospital encounter of 08/27/19 (from the past 48 hour(s))  Culture, blood (routine x 2)     Status: None (Preliminary result)   Collection Time: 08/27/19  8:54 AM   Specimen: BLOOD  Result Value Ref Range   Specimen Description BLOOD RIGHT ANTECUBITAL    Special Requests      BOTTLES DRAWN AEROBIC ONLY Blood Culture adequate volume   Culture  Setup Time      GRAM POSITIVE COCCI IN CLUSTERS AEROBIC BOTTLE ONLY CRITICAL RESULT CALLED TO, READ BACK BY AND VERIFIED WITH: G. ABBOTT,PHARMD 0217 08/28/2019 Mena Goes Performed at [facility], [address], [place], [place] [address]    Culture GRAM POSITIVE COCCI    Report Status PENDING   Comprehensive metabolic panel     Status: Abnormal   Collection Time: 08/27/19  9:08 AM  Result Value Ref Range   Sodium 135 135 - 145 mmol/L   Potassium 4.3 3.5 - 5.1 mmol/L   Chloride 99 98 - 111 mmol/L   CO2 21 (L) 22 - 32 mmol/L   Glucose, Bld 173 (H) 70 - 99 mg/dL  BUN 36 (H) 8 - 23 mg/dL   Creatinine, Ser 0.97 0.61 - 1.24 mg/dL   Calcium 10.1 8.9 - 10.3 mg/dL   Total Protein 7.5 6.5 - 8.1 g/dL   Albumin 3.5 3.5 - 5.0 g/dL   AST 20 15 - 41 U/L   ALT 48 (H) 0 - 44 U/L   Alkaline Phosphatase 103 38 - 126 U/L   Total Bilirubin 1.7 (H) 0.3 - 1.2 mg/dL   GFR calc non Af Amer >60 >60 mL/min   GFR calc Af Amer >60 >60 mL/min   Anion gap 15 5 - 15    Comment: Performed at [facility] [address] [place], [place] [address]  CBC with Differential     Status: Abnormal   Collection Time: 08/27/19   9:08 AM  Result Value Ref Range   WBC 12.0 (H) 4.0 - 10.5 K/uL   RBC 6.29 (H) 4.22 - 5.81 MIL/uL   Hemoglobin 19.4 (H) 13.0 - 17.0 g/dL   HCT 56.4 (H) 39.0 - 52.0 %   MCV 89.7 80.0 - 100.0 fL   MCH 30.8 26.0 - 34.0 pg   MCHC 34.4 30.0 - 36.0 g/dL   RDW 13.4 11.5 - 15.5 %   Platelets 103 (L) 150 - 400 K/uL    Comment: REPEATED TO VERIFY PLATELET COUNT CONFIRMED BY SMEAR Immature Platelet Fraction may be clinically indicated, consider ordering this additional test [id number]    nRBC 0.0 0.0 - 0.2 %   Neutrophils Relative % 81 %   Neutro Abs 9.8 (H) 1.7 - 7.7 K/uL   Lymphocytes Relative 7 %   Lymphs Abs 0.8 0.7 - 4.0 K/uL   Monocytes Relative 10 %   Monocytes Absolute 1.2 (H) 0.1 - 1.0 K/uL   Eosinophils Relative 1 %   Eosinophils Absolute 0.1 0.0 - 0.5 K/uL   Basophils Relative 0 %   Basophils Absolute 0.0 0.0 - 0.1 K/uL   Immature Granulocytes 1 %   Abs Immature Granulocytes 0.06 0.00 - 0.07 K/uL    Comment: Performed at [facility] [address] [place], [place] [address]  Sedimentation rate     Status: None   Collection Time: 08/27/19  9:08 AM  Result Value Ref Range   Sed Rate 8 0 - 16 mm/hr    Comment: Performed at [facility] [address], [place], [place] [address]  C-reactive protein     Status: Abnormal   Collection Time: 08/27/19  9:08 AM  Result Value Ref Range   CRP 1.3 (H) <1.0 mg/dL    Comment: Performed at [facility] [address] [place], [place] [address]  SARS CORONAVIRUS 2 (TAT 6-12 HRS) Nasal Swab Aptima Multi Swab     Status: None   Collection Time: 08/27/19  9:09 AM   Specimen: Aptima Multi Swab; Nasal Swab  Result Value Ref Range   SARS Coronavirus 2 NEGATIVE NEGATIVE    Comment: (NOTE) SARS-CoV-2 target nucleic acids are NOT DETECTED. The SARS-CoV-2 RNA is generally detectable in upper and lower respiratory specimens during the acute phase of infection. Negative results do not preclude SARS-CoV-2 infection, do not  rule out co-infections with other pathogens, and should not be used as the sole basis for treatment or other patient management decisions. Negative results must be combined with clinical observations, patient history, and epidemiological information. The expected result is Negative. Fact Sheet for Patients: SugarRoll.be Fact Sheet for Healthcare Providers: https://www.woods-mathews.com/ This test  is not yet approved or cleared by the [place] and  has been authorized for detection and/or diagnosis of SARS-CoV-2 by FDA under an Emergency Use Authorization (EUA). This EUA will remain  in effect (meaning this test can be used) for the duration of the COVID-19 declaration under Section 56 4(b)(1) of the Act, 21 U.S.C. section 360bbb-3(b)(1), unless the authorization is terminated or revoked sooner. Performed at [facility], [place] [address], [place], [place] [address]   Culture, blood (routine x 2)     Status: None (Preliminary result)   Collection Time: 08/27/19  9:51 AM   Specimen: BLOOD RIGHT ARM  Result Value Ref Range   Specimen Description BLOOD RIGHT ARM    Special Requests      BOTTLES DRAWN AEROBIC AND ANAEROBIC Blood Culture adequate volume   Culture  Setup Time      GRAM POSITIVE COCCI IN CLUSTERS IN BOTH AEROBIC AND ANAEROBIC BOTTLES CRITICAL RESULT CALLED TO, READ BACK BY AND VERIFIED WITH: G. ABBOTT,PHARMD 0217 08/28/2019 Mena Goes Performed at [facility], [place] [address] [place], [place] [address]    Culture GRAM POSITIVE COCCI    Report Status PENDING   Blood Culture ID Panel (Reflexed)     Status: Abnormal   Collection Time: 08/27/19  9:51 AM  Result Value Ref Range   Enterococcus species NOT DETECTED NOT DETECTED   Listeria monocytogenes NOT DETECTED NOT DETECTED   Staphylococcus species DETECTED (A) NOT DETECTED    Comment: CRITICAL RESULT CALLED TO, READ BACK BY AND VERIFIED WITH: G. ABBOTT,PHARMD  0217 08/28/2019 T. TYSOR    Staphylococcus aureus (BCID) DETECTED (A) NOT DETECTED    Comment: Methicillin (oxacillin) susceptible Staphylococcus aureus (MSSA). Preferred therapy is anti staphylococcal beta lactam antibiotic (Cefazolin or Nafcillin), unless clinically contraindicated. CRITICAL RESULT CALLED TO, READ BACK BY AND VERIFIED WITH: G. ABBOTT,PHARMD 0217 08/28/2019 T. TYSOR    Methicillin resistance NOT DETECTED NOT DETECTED   Streptococcus species NOT DETECTED NOT DETECTED   Streptococcus agalactiae NOT DETECTED NOT DETECTED   Streptococcus pneumoniae NOT DETECTED NOT DETECTED   Streptococcus pyogenes NOT DETECTED NOT DETECTED   Acinetobacter baumannii NOT DETECTED NOT DETECTED   Enterobacteriaceae species NOT DETECTED NOT DETECTED   Enterobacter cloacae complex NOT DETECTED NOT DETECTED   Escherichia coli NOT DETECTED NOT DETECTED   Klebsiella oxytoca NOT DETECTED NOT DETECTED   Klebsiella pneumoniae NOT DETECTED NOT DETECTED   Proteus species NOT DETECTED NOT DETECTED   Serratia marcescens NOT DETECTED NOT DETECTED   Haemophilus influenzae NOT DETECTED NOT DETECTED   Neisseria meningitidis NOT DETECTED NOT DETECTED   Pseudomonas aeruginosa NOT DETECTED NOT DETECTED   Candida albicans NOT DETECTED NOT DETECTED   Candida glabrata NOT DETECTED NOT DETECTED   Candida krusei NOT DETECTED NOT DETECTED   Candida parapsilosis NOT DETECTED NOT DETECTED   Candida tropicalis NOT DETECTED NOT DETECTED    Comment: Performed at [facility], [address] [place], [place] [address]  Protime-INR     Status: None   Collection Time: 08/27/19  3:31 PM  Result Value Ref Range   Prothrombin Time 14.4 11.4 - 15.2 seconds   INR 1.1 0.8 - 1.2    Comment: (NOTE) INR goal varies based on device and disease states. Performed at [facility], [place] [address], [place], [place] [address]   TSH     Status: None   Collection Time: 08/27/19  5:05 PM  Result Value Ref Range  TSH  1.414 0.350 - 4.500 uIU/mL    Comment: Performed by a 3rd Generation assay with a functional sensitivity of <=0.01 uIU/mL. Performed at [facility], [place] [address], [place], [place] [address]   CK     Status: Abnormal   Collection Time: 08/27/19  5:05 PM  Result Value Ref Range   Total CK 30 (L) 49 - 397 U/L    Comment: Performed at [facility], [place] [address], [place], [place] [address]  Myoglobin, serum     Status: None   Collection Time: 08/27/19  5:05 PM  Result Value Ref Range   Myoglobin 28 28 - 72 ng/mL    Comment: (NOTE) Performed At: [facility] [place], [place] [id number] Rush Farmer MD [id number]   Glucose, capillary     Status: Abnormal   Collection Time: 08/27/19  5:07 PM  Result Value Ref Range   Glucose-Capillary 263 (H) 70 - 99 mg/dL  Glucose, capillary     Status: Abnormal   Collection Time: 08/27/19  9:32 PM  Result Value Ref Range   Glucose-Capillary 184 (H) 70 - 99 mg/dL  Basic metabolic panel     Status: Abnormal   Collection Time: 08/28/19  4:15 AM  Result Value Ref Range   Sodium 131 (L) 135 - 145 mmol/L   Potassium 4.4 3.5 - 5.1 mmol/L   Chloride 97 (L) 98 - 111 mmol/L   CO2 21 (L) 22 - 32 mmol/L   Glucose, Bld 230 (H) 70 - 99 mg/dL   BUN 37 (H) 8 - 23 mg/dL   Creatinine, Ser 1.48 (H) 0.61 - 1.24 mg/dL   Calcium 9.3 8.9 - 10.3 mg/dL   GFR calc non Af Amer 48 (L) >60 mL/min   GFR calc Af Amer 55 (L) >60 mL/min   Anion gap 13 5 - 15    Comment: Performed at [facility] [address] [place], [place] [address]  CBC     Status: Abnormal   Collection Time: 08/28/19  4:15 AM  Result Value Ref Range   WBC 15.8 (H) 4.0 - 10.5 K/uL   RBC 5.72 4.22 - 5.81 MIL/uL   Hemoglobin 17.9 (H) 13.0 - 17.0 g/dL   HCT 51.2 39.0 - 52.0 %   MCV 89.5 80.0 - 100.0 fL   MCH 31.3 26.0 - 34.0 pg   MCHC 35.0 30.0 - 36.0 g/dL   RDW 13.7 11.5 - 15.5 %   Platelets 91 (L) 150 - 400 K/uL    Comment: REPEATED TO  VERIFY Immature Platelet Fraction may be clinically indicated, consider ordering this additional test [id number] CONSISTENT WITH PREVIOUS RESULT    nRBC 0.0 0.0 - 0.2 %    Comment: Performed at [facility], [place] [address] [place], [place] [address]  Hepatic function panel     Status: Abnormal   Collection Time: 08/28/19  4:15 AM  Result Value Ref Range   Total Protein 6.1 (L) 6.5 - 8.1 g/dL   Albumin 2.9 (L) 3.5 - 5.0 g/dL   AST 22 15 - 41 U/L   ALT 40 0 - 44 U/L   Alkaline Phosphatase 112 38 - 126 U/L   Total Bilirubin 1.8 (H) 0.3 - 1.2 mg/dL   Bilirubin, Direct 0.8 (H) 0.0 - 0.2 mg/dL   Indirect Bilirubin 1.0 (H) 0.3 - 0.9 mg/dL    Comment: Performed at [facility] [address] [place], [place]  [address]    Recent Results (from the past 240 hour(s))  Culture, blood (routine x 2)     Status: None (Preliminary result)   Collection Time: 08/27/19  8:54 AM   Specimen: BLOOD  Result Value Ref Range Status   Specimen Description BLOOD RIGHT ANTECUBITAL  Final   Special Requests   Final    BOTTLES DRAWN AEROBIC ONLY Blood Culture adequate volume   Culture  Setup Time   Final    GRAM POSITIVE COCCI IN CLUSTERS AEROBIC BOTTLE ONLY CRITICAL RESULT CALLED TO, READ BACK BY AND VERIFIED WITH: G. ABBOTT,PHARMD 0217 08/28/2019 Mena Goes Performed at [facility], [address], [place], [place] [address]    Culture GRAM POSITIVE COCCI  Final   Report Status PENDING  Incomplete  SARS CORONAVIRUS 2 (TAT 6-12 HRS) Nasal Swab Aptima Multi Swab     Status: None   Collection Time: 08/27/19  9:09 AM   Specimen: Aptima Multi Swab; Nasal Swab  Result Value Ref Range Status   SARS Coronavirus 2 NEGATIVE NEGATIVE Final    Comment: (NOTE) SARS-CoV-2 target nucleic acids are NOT DETECTED. The SARS-CoV-2 RNA is generally detectable in upper and lower respiratory specimens during the acute phase of infection. Negative results do not preclude SARS-CoV-2 infection, do not  rule out co-infections with other pathogens, and should not be used as the sole basis for treatment or other patient management decisions. Negative results must be combined with clinical observations, patient history, and epidemiological information. The expected result is Negative. Fact Sheet for Patients: SugarRoll.be Fact Sheet for Healthcare Providers: https://www.woods-mathews.com/ This test is not yet approved or cleared by the [place] FDA and  has been authorized for detection and/or diagnosis of SARS-CoV-2 by FDA under an Emergency Use Authorization (EUA). This EUA will remain  in effect (meaning this test can be used) for the duration of the COVID-19 declaration under Section 56 4(b)(1) of the Act, 21 U.S.C. section 360bbb-3(b)(1), unless the authorization is terminated or revoked sooner. Performed at [facility], [place] [address], [place], [place] [address]   Culture, blood (routine x 2)     Status: None (Preliminary result)   Collection Time: 08/27/19  9:51 AM   Specimen: BLOOD RIGHT ARM  Result Value Ref Range Status   Specimen Description BLOOD RIGHT ARM  Final   Special Requests   Final    BOTTLES DRAWN AEROBIC AND ANAEROBIC Blood Culture adequate volume   Culture  Setup Time   Final    GRAM POSITIVE COCCI IN CLUSTERS IN BOTH AEROBIC AND ANAEROBIC BOTTLES CRITICAL RESULT CALLED TO, READ BACK BY AND VERIFIED WITH: G. ABBOTT,PHARMD 0217 08/28/2019 Mena Goes Performed at [facility], [place] [address] [place], [place] [address]    Culture GRAM POSITIVE COCCI  Final   Report Status PENDING  Incomplete  Blood Culture ID Panel (Reflexed)     Status: Abnormal   Collection Time: 08/27/19  9:51 AM  Result Value Ref Range Status   Enterococcus species NOT DETECTED NOT DETECTED Final   Listeria monocytogenes NOT DETECTED NOT DETECTED Final   Staphylococcus species DETECTED (A) NOT DETECTED Final    Comment: CRITICAL  RESULT CALLED TO, READ BACK BY AND VERIFIED WITH: G. ABBOTT,PHARMD 0217 08/28/2019 T. TYSOR    Staphylococcus aureus (BCID) DETECTED (A) NOT DETECTED Final    Comment: Methicillin (oxacillin) susceptible Staphylococcus aureus (MSSA). Preferred therapy is anti staphylococcal beta lactam antibiotic (Cefazolin or Nafcillin), unless clinically contraindicated. CRITICAL RESULT  CALLED TO, READ BACK BY AND VERIFIED WITH: G. ABBOTT,PHARMD 0217 08/28/2019 T. TYSOR    Methicillin resistance NOT DETECTED NOT DETECTED Final   Streptococcus species NOT DETECTED NOT DETECTED Final   Streptococcus agalactiae NOT DETECTED NOT DETECTED Final   Streptococcus pneumoniae NOT DETECTED NOT DETECTED Final   Streptococcus pyogenes NOT DETECTED NOT DETECTED Final   Acinetobacter baumannii NOT DETECTED NOT DETECTED Final   Enterobacteriaceae species NOT DETECTED NOT DETECTED Final   Enterobacter cloacae complex NOT DETECTED NOT DETECTED Final   Escherichia coli NOT DETECTED NOT DETECTED Final   Klebsiella oxytoca NOT DETECTED NOT DETECTED Final   Klebsiella pneumoniae NOT DETECTED NOT DETECTED Final   Proteus species NOT DETECTED NOT DETECTED Final   Serratia marcescens NOT DETECTED NOT DETECTED Final   Haemophilus influenzae NOT DETECTED NOT DETECTED Final   Neisseria meningitidis NOT DETECTED NOT DETECTED Final   Pseudomonas aeruginosa NOT DETECTED NOT DETECTED Final   Candida albicans NOT DETECTED NOT DETECTED Final   Candida glabrata NOT DETECTED NOT DETECTED Final   Candida krusei NOT DETECTED NOT DETECTED Final   Candida parapsilosis NOT DETECTED NOT DETECTED Final   Candida tropicalis NOT DETECTED NOT DETECTED Final    Comment: Performed at [facility], [place] [address] [place], [place] [address]    Lipid Panel No results for input(s): CHOL, TRIG, HDL, CHOLHDL, VLDL, LDLCALC in the last 72 hours.  Studies/Results: Mr Jeri Cos KC Contrast  Result Date: 08/28/2019 CLINICAL DATA:  Worsening low  back pain and weakness. EXAM: MRI HEAD WITHOUT AND WITH CONTRAST TECHNIQUE: Multiplanar, multiecho pulse sequences of the brain and surrounding structures were obtained without and with intravenous contrast. CONTRAST:  10 cc Gadavist COMPARISON:  06/05/2018 FINDINGS: Brain: Diffusion imaging shows a few punctate foci of restricted diffusion, 2 in the right thalamus in 1 in the splenium of the corpus callosum to the left of midline. These could be due to micro embolic infarctions in the posterior circulation. The brainstem is normal. Few old small vessel cerebellar infarctions. Cerebral hemispheres show mild chronic small-vessel ischemic change of the deep white matter. No mass lesion, hemorrhage, hydrocephalus or extra-axial collection. After contrast administration, no abnormal enhancement occurs. Vascular: Major vessels at the base of the brain show flow. Skull and upper cervical spine: Negative Sinuses/Orbits: Clear/normal Other: Question presence of a posterior nasopharyngeal mass on the right. Suggest ENT consultation. This could be a benign polyp or a malignancy. 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 These could be due to micro embolic infarctions in the posterior circulation. Mild chronic small-vessel ischemic changes of the brain elsewhere as seen previously. Probable posterior nasopharyngeal polyp or mass lesion on the right. ENT referral suggested at some point. Electronically Signed   By: Nelson Chimes M.D.   On: 08/28/2019 07:00   Mr Cervical Spine W Wo Contrast  Result Date: 08/28/2019 CLINICAL DATA:  Weakness.  Myelopathy. EXAM: MRI CERVICAL SPINE WITHOUT AND WITH CONTRAST TECHNIQUE: Multiplanar and multiecho pulse sequences of the cervical spine, to include the craniocervical junction and cervicothoracic junction, were obtained without and with intravenous contrast. CONTRAST:  10 cc MultiHance COMPARISON:  None. FINDINGS:  Alignment: Mild straightening of the normal cervical lordosis. Vertebrae: No fracture or primary bone lesion. Cord: No cord compression or primary cord lesion. Posterior Fossa, vertebral arteries, paraspinal tissues: See results of brain MRI. Disc levels: No significant finding at the foramen magnum, C1-2 or C2-3. C3-4: Spondylosis  without compressive central canal stenosis. Foraminal narrowing left worse than right. This could affect the left C4 nerve. C4-5: Spondylosis with canal narrowing and effacement of the subarachnoid space surrounding the cord. AP diameter of the canal 10 mm. No cord compression. Bilateral foraminal narrowing that could affect either C5 nerve. C5-6: Spondylosis with narrowing the canal but no cord compression. AP diameter 1 cm. Bilateral foraminal narrowing that could affect either C6 nerve. C6-7: Spondylosis with narrowing of the ventral subarachnoid space. AP diameter of the canal 11 mm. Bilateral foraminal narrowing that could affect either C7 nerve. C7-T1: Facet osteoarthritis on the left. No canal stenosis. Mild left foraminal stenosis. IMPRESSION: No primary cord lesion or cord compression. Ordinary cervical spondylosis. No compressive central canal stenosis. On the left at C3-4, bilateral at C4-5, bilateral at C5-6, bilateral at C6-7 and on the left at C7-T1. Electronically Signed   By: Nelson Chimes M.D.   On: 08/28/2019 07:04   Mr Thoracic Spine W Wo Contrast  Result Date: 08/28/2019 CLINICAL DATA:  Worsening back pain with recent abdominal aortic aneurysm diagnosis. Difficulty getting out of bed. EXAM: MRI THORACIC AND LUMBAR SPINE WITHOUT AND WITH CONTRAST TECHNIQUE: Multiplanar and multiecho pulse sequences of the thoracic and lumbar spine were obtained without and with intravenous contrast. CONTRAST:  10 mL Gadavist COMPARISON:  None. FINDINGS: MRI THORACIC SPINE FINDINGS Alignment:  Physiologic. Vertebrae: No fracture, evidence of discitis, or bone lesion. Cord:  Normal  signal and morphology. Paraspinal and other soft tissues: Bilateral dependent atelectasis. Disc levels: T8-9: Small central disc protrusion without stenosis. T9-10: Small right subarticular disc protrusion without stenosis. T11-12: Left subarticular disc protrusion without stenosis. MRI LUMBAR SPINE FINDINGS Segmentation:  Standard. Alignment:  Physiologic. Vertebrae: Hyperintense T2-weighted signal with contrast enhancement at L4-5, likely degenerative. Conus medullaris: Extends to the L2 level and appears normal. Paraspinal and other soft tissues: Negative Disc levels: L1-2: No disc herniation or stenosis. L2-3: Intermediate disc bulge with mild spinal canal stenosis. Mild right foraminal stenosis. L3-4: Right eccentric disc bulge with narrowing of the right lateral recess and mild right foraminal stenosis. L4-5: Mild disc edema with left-greater-than-right facet arthrosis. Small amount of fluid in the left facet. Left-greater-than-right lateral recess stenosis. Severe left and moderate right foraminal stenosis. L5-S1: Intermediate disc bulge with endplate spurring. Moderate bilateral neural foraminal stenosis. No central spinal canal stenosis. IMPRESSION: 1. No spinal cord or cauda equina abnormality. 2. Signal changes and contrast enhancement of the bone marrow at L4-5, favored to be degenerative. No height loss or endplate erosion. 3. Multilevel moderate-to-severe lumbar neural foraminal stenosis. 4. 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 Electronically Signed   By: Ulyses Jarred M.D.   On: 08/28/2019 06:50   Mr Lumbar Spine W Wo Contrast  Result Date: 08/28/2019 CLINICAL DATA:  Worsening back pain with recent abdominal aortic aneurysm diagnosis. Difficulty getting out of bed. EXAM: MRI THORACIC AND LUMBAR SPINE WITHOUT AND WITH CONTRAST TECHNIQUE: Multiplanar and multiecho pulse sequences of the thoracic and lumbar spine were obtained without and with intravenous contrast. CONTRAST:  10  mL Gadavist COMPARISON:  None. FINDINGS: MRI THORACIC SPINE FINDINGS Alignment:  Physiologic. Vertebrae: No fracture, evidence of discitis, or bone lesion. Cord:  Normal signal and morphology. Paraspinal and other soft tissues: Bilateral dependent atelectasis. Disc levels: T8-9: Small central disc protrusion without stenosis. T9-10: Small right subarticular disc protrusion without stenosis. T11-12: Left subarticular disc protrusion without stenosis. MRI LUMBAR SPINE FINDINGS Segmentation:  Standard. Alignment:  Physiologic. Vertebrae: Hyperintense T2-weighted signal with  contrast enhancement at L4-5, likely degenerative. Conus medullaris: Extends to the L2 level and appears normal. Paraspinal and other soft tissues: Negative Disc levels: L1-2: No disc herniation or stenosis. L2-3: Intermediate disc bulge with mild spinal canal stenosis. Mild right foraminal stenosis. L3-4: Right eccentric disc bulge with narrowing of the right lateral recess and mild right foraminal stenosis. L4-5: Mild disc edema with left-greater-than-right facet arthrosis. Small amount of fluid in the left facet. Left-greater-than-right lateral recess stenosis. Severe left and moderate right foraminal stenosis. L5-S1: Intermediate disc bulge with endplate spurring. Moderate bilateral neural foraminal stenosis. No central spinal canal stenosis. IMPRESSION: 1. No spinal cord or cauda equina abnormality. 2. Signal changes and contrast enhancement of the bone marrow at L4-5, favored to be degenerative. No height loss or endplate erosion. 3. Multilevel moderate-to-severe lumbar neural foraminal stenosis. 4. 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 Electronically Signed   By: Ulyses Jarred M.D.   On: 08/28/2019 06:50    Medications:  Scheduled: . acetaminophen  325 mg Oral Once  . atorvastatin  80 mg Oral QHS  . brimonidine  1 drop Both Eyes TID  . docusate sodium  100 mg Oral BID  . dofetilide  500 mcg Oral BID  . insulin  aspart  0-15 Units Subcutaneous TID WC  . insulin aspart  0-5 Units Subcutaneous QHS  . ipratropium-albuterol  3 mL Nebulization TID  . loratadine  10 mg Oral Daily  . pantoprazole  40 mg Oral Daily  . Suvorexant  1 tablet Oral QHS   Continuous: . sodium chloride    .  ceFAZolin (ANCEF) IV 2 g (08/28/19 0334)  . methocarbamol (ROBAXIN) IV     MRI Brain: Punctate acute infarctions, 2 in the right thalamus and 1 in the splenium of the corpus callosum. These could be due to micro embolic infarctions in the posterior circulation. Mild chronic small-vessel ischemic changes of the brain elsewhere as seen previously.Probable posterior nasopharyngeal polyp or mass lesion on the right. ENT referral suggested at some point.  MRI cervical spine: No primary cord lesion or cord compression. Ordinary cervical spondylosis. No compressive central canal stenosis. On the left at C3-4, bilateral at C4-5, bilateral at C5-6, bilateral at C6-7 and on the left at C7-T1.  MRI T-spine and L-spine:  1. No spinal cord or cauda equina abnormality. 2. Signal changes and contrast enhancement of the bone marrow at L4-5, favored to be degenerative. No height loss or endplate erosion. 3. Multilevel moderate-to-severe lumbar neural foraminal stenosis. 4. 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 Neurology review of images: The findings at L4-5 appear more consistent with a discitis/osteomyelitis, in the context of severe LBP exacerbated by movement, his elevated WBC, skin warm to touch yesterday suggestive of a mild fever, his decreased appetite x 2 weeks, malaise x 2 weeks and his staph bacteremia.   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 His second primary neurological complaint consists of severe muscle pain in upper and lower extremities as well as his neck which limit mobility and has resulted in being bedridden with decreased PO intake over the past 2 weeks. He has an elevated BUN:Cr ratio consistent with his stated decreased PO intake. He presented to the emergency  department primarily for evaluation of new onset of loss of strength and pain in the upper extremities, lower extremities and lower back.   1. DDx for vision loss and weakness includes NMO and MS. DDx for muscle pain and stiffness includes stiff person syndrome (SPS), dermatomyositis and severe polymyalgia rheumatica (this could be associated with temporal arteritis, but he has no complaints of temporal pain). The DDx for his severe low back pain, which is worse than the muscle pain in his limbs, includes SPS, discitis/osteomyelitis and severe spinal stenosis. 2. 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 3. L-spine MRI reviewed: The findings at L4-5 appear more consistent with a discitis/osteomyelitis, in the context of severe LBP exacerbated by movement, his elevated WBC, skin warm to touch yesterday suggestive of a mild fever, his decreased appetite x 2 weeks, malaise x 2 weeks and his staph bacteremia.  4. MRI cervical and thoracic spine shows degenerative changes without cord signal abnormality.  5. MRI brain reviewed. The DWI abnormalities are faint and may represent T2-shine through artifact rather than strokes; if they are strokes, then most likely to be late subacute due to hypertensive small vessel disease, with the locations being atypical for microembolic strokes. To fully assess, will need TTE and CTA of head and neck.  6. CK was only 30. Myoglobin was 28, also normal. Most likely does not have a myositis.    Recommendations: - Anti-glutamic acid decarboxylase antibody titer (anti-GAD). This has been ordered.  -- ESR, ANA and c-reactive protein.  -- IV hydration - We will hold off on LP and await initial diagnostic tests first. --Trial of Robaxin 1000 mg IV TID for 6 doses has been ordered. Observe for possible improvement in LBP and diffuse limb and neck muscle pain.  -- ID consult is recommended regarding IV ABX for probable L4-5 osteomyelitis/discitis. -- TTE -- CTA of head and neck -- Cardiac telemetry -- Off Pradaxa in anticipation of LP under fluoro. Restart 24 hour after LP.     LOS: 1 day   @Electronically  signed: Dr. Kerney Elbe 08/28/2019  8:08 AM

## 2019-08-28 NOTE — Progress Notes (Signed)
Fever has resolved, HR 95, SpO2 94 on 3 L nasal cannula, RR 13, Bp 108/68. Pt in distress.  MRi ready for pt at this time. Test will last 2-3 hr. Per Bodenheimer, NP it is safe to send pt w/o PC monitor for test at this time.  Ativan given as ordered and pt transported to MRI.  Second dose of Ativan given at 0615, since pt became agitated and anxious and could not stay still. Verbal order given by Silas [place], NP

## 2019-08-28 NOTE — Progress Notes (Signed)
Upon returning from MRI, pt VS stable, pt placed back on CPAP per pt request. VS stable and pt resting comfortably at this time.  Will continue to monitor pt.

## 2019-08-28 NOTE — Progress Notes (Signed)
Upon arrival, patient asked if I had brought him a CPAP. Patient stated he "needed it tonight to sleep". Patient wears CPAP at home and was able to give me his settings as well. Will put in CPAP order and continue to monitor patient.

## 2019-08-28 NOTE — Progress Notes (Signed)
Patient's NIF of -40 and VC of 2.8L with good effort.

## 2019-08-28 NOTE — Consult Note (Signed)
[facility]       Reason for Consult: MSSA bacteremia    Referring Physician: Dr. Starla Link  Principal Problem:   Myelopathy [name]) Active Problems:   Hyperlipidemia   Obstructive sleep apnea   Type 2 diabetes mellitus with other circulatory complications ([place])   Atrial fibrillation ([place])   H/O optic neuritis   . acetaminophen  325 mg Oral Once  . aspirin EC  81 mg Oral Daily  . atorvastatin  80 mg Oral QHS  . brimonidine  1 drop Both Eyes TID  . docusate sodium  100 mg Oral BID  . dofetilide  500 mcg Oral BID  . insulin aspart  0-15 Units Subcutaneous TID WC  . insulin aspart  0-5 Units Subcutaneous QHS  . ipratropium-albuterol  3 mL Nebulization TID  . loratadine  10 mg Oral Daily  . pantoprazole  40 mg Oral Daily  . Suvorexant  1 tablet Oral QHS    Recommendations: Cefazolin Repeat blood cultures  Echo  Assessment: He has new back pain that has progressively worsened in the setting of fever, bacteremia and concerns on MRI of L4-5 of discitis/osteomyelitis.  CRP and ESR though unremarkable though had been on steroids recently.   Antibiotics: Cefazolin day 2  HPI: [name] [name]. is a 68 y.o. male with diabetes, optic nerve neuritis recently on treatment with steroids earlier this month, recent cellulitis treated with doxycycline developed progressively worsening mid back pain.  This limited his daily acitivities and was not controlled with pain medications.  MRI done and Dr. Cheral Marker reads it as most consistent with discitis/osteomyelitis of L4-5.  He now also has positive MSSA in his blood cultures and has been febrile to 102.1 and with leukocytosis.  He has had a lot of difficulty walking due to the pain.  No associated rash or diarrhea.    Review of Systems:  Constitutional: negative for malaise and anorexia Gastrointestinal: negative for diarrhea Integument/breast: negative for rash All other systems reviewed and are negative     Past Medical History:  Diagnosis Date  . Atrial fibrillation ([place]) 03/22/2009   a. s/p multiple DCCV; b. no coumadin due to low TE risk profile; c. Tikosyn Rx  . Coronary atherosclerosis of native coronary artery 11/2002   a. s/p stent to LAD 12/03; OM2 occluded at cath 12/03; d. myoview 5/10: no ischemia;  e. echo 7/11: EF 55%, BAE, mild RVE, PASP 41-45; Myoview was in March 2013. There was no ischemia or infarction, EF 51%   . Cutaneous abscess of back excluding buttocks 07/04/2014   Appears to stem from possibly a cyst very large area 6 cm contact surgeon office   . Diabetes mellitus without complication ([place])   . Drusen body    see opth note  . ERECTILE DYSFUNCTION 03/22/2009  . GERD 03/22/2009  . HYPERGLYCEMIA 04/25/2010  . HYPERLIPIDEMIA 03/22/2009  . 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 LATERAL EPICONDYLITIS, LEFT 10/24/2009  . LIVER FUNCTION TESTS, ABNORMAL 04/25/2010  . Local reaction to immunization 05/05/2012   minor resolving  zostavax   . Myocardial infarction ([place]) mi2003  . Numbness in left leg    foot related to back disease and surgery  . Obesity, unspecified 04/24/2009  . Perforated appendicitis with necrosis s/p open appendectomy 06/07/14 06/04/2014  . Renal cyst    Characterized by MRI as simple  . Ruptured suppurative appendicitis    2015   .  SLEEP APNEA, OBSTRUCTIVE 03/22/2009   compliant with CPAP  . THROMBOCYTOPENIA 08/16/2010  . TOBACCO USE, QUIT 10/24/2009  . 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 Umbilical hernia     Social History   Tobacco Use  . Smoking status: Former Smoker    Packs/day: 2.00    Years: 42.00    Pack years: 84.00    Types: Cigarettes    Quit date: 12/30/2008    Years since quitting: 10.6  . Smokeless tobacco: Never Used  . Tobacco comment: started at age 31; 1-2 ppd; quit 2010  Substance Use Topics  . Alcohol use: No    Alcohol/week: 0.0 standard drinks    Comment: rarely; maybe 1 beer a year  . Drug  use: No    Family History  Problem Relation Age of Onset  . Thyroid disease Mother   . Ovarian cancer Mother   . Breast cancer Mother   . Lung cancer Father   . Cancer Father     Allergies  Allergen Reactions  . Novocain [Procaine] Hives    Dentist appointment in 1968; since then has tolerated lidocaine and provocaine with no hives or difficulty.  . Penicillins     Childhood reaction-details unknown  . Pseudoephedrine Other (See Comments)    Patient went into afib  . Sulfonamide Derivatives     Childhood reaction   . Cardizem [Diltiazem] Rash    Also 2019  . Pseudoephedrine Hcl Palpitations    Physical Exam: Constitutional: in no apparent distress  Vitals:   08/28/19 1123 08/28/19 1152  BP: 117/75 111/73  Pulse: 98 (!) 102  Resp: 16 16  Temp:  98.5 F (36.9 C)  SpO2: 92% 95%   EYES: anicteric ENMT: no thrush Cardiovascular: Cor RRR Respiratory: CTA B; normal respiratory effort GI: Bowel sounds are normal, liver is not enlarged, spleen is not enlarged Musculoskeletal: no pedal edema noted Skin: negatives: no rash Neuro: limited exam due to pain  Lab Results  Component Value Date   WBC 15.8 (H) 08/28/2019   HGB 17.9 (H) 08/28/2019   HCT 51.2 08/28/2019   MCV 89.5 08/28/2019   PLT 91 (L) 08/28/2019    Lab Results  Component Value Date   CREATININE 1.48 (H) 08/28/2019   BUN 37 (H) 08/28/2019   NA 131 (L) 08/28/2019   K 4.4 08/28/2019   CL 97 (L) 08/28/2019   CO2 21 (L) 08/28/2019    Lab Results  Component Value Date   ALT 40 08/28/2019   AST 22 08/28/2019   ALKPHOS 112 08/28/2019     Microbiology: Recent Results (from the past 240 hour(s))  Culture, blood (routine x 2)     Status: Abnormal (Preliminary result)   Collection Time: 08/27/19  8:54 AM   Specimen: BLOOD  Result Value Ref Range Status   Specimen Description BLOOD RIGHT ANTECUBITAL  Final   Special Requests   Final    BOTTLES DRAWN AEROBIC ONLY Blood Culture adequate volume    Culture  Setup Time   Final    GRAM POSITIVE COCCI IN CLUSTERS AEROBIC BOTTLE ONLY CRITICAL RESULT CALLED TO, READ BACK BY AND VERIFIED WITH: G. ABBOTT,PHARMD 0217 08/28/2019 Mena Goes Performed at [facility], [address], [place], [place] [address]    Culture STAPHYLOCOCCUS AUREUS (A)  Final   Report Status PENDING  Incomplete  SARS CORONAVIRUS 2 (TAT 6-12 HRS) Nasal Swab Aptima Multi Swab     Status: None   Collection Time: 08/27/19  9:09  AM   Specimen: Aptima Multi Swab; Nasal Swab  Result Value Ref Range Status   SARS Coronavirus 2 NEGATIVE NEGATIVE Final    Comment: (NOTE) SARS-CoV-2 target nucleic acids are NOT DETECTED. The SARS-CoV-2 RNA is generally detectable in upper and lower respiratory specimens during the acute phase of infection. Negative results do not preclude SARS-CoV-2 infection, do not rule out co-infections with other pathogens, and should not be used as the sole basis for treatment or other patient management decisions. Negative results must be combined with clinical observations, patient history, and epidemiological information. The expected result is Negative. Fact Sheet for Patients: SugarRoll.be Fact Sheet for Healthcare Providers: https://www.woods-mathews.com/ This test is not yet approved or cleared by the [place] FDA and  has been authorized for detection and/or diagnosis of SARS-CoV-2 by FDA under an Emergency Use Authorization (EUA). This EUA will remain  in effect (meaning this test can be used) for the duration of the COVID-19 declaration under Section 56 4(b)(1) of the Act, 21 U.S.C. section 360bbb-3(b)(1), unless the authorization is terminated or revoked sooner. Performed at [facility], [place] [address] [place], [place] [address]   Culture, blood (routine x 2)     Status: Abnormal (Preliminary result)   Collection Time: 08/27/19  9:51 AM   Specimen: BLOOD RIGHT ARM  Result  Value Ref Range Status   Specimen Description BLOOD RIGHT ARM  Final   Special Requests   Final    BOTTLES DRAWN AEROBIC AND ANAEROBIC Blood Culture adequate volume   Culture  Setup Time   Final    GRAM POSITIVE COCCI IN CLUSTERS IN BOTH AEROBIC AND ANAEROBIC BOTTLES CRITICAL RESULT CALLED TO, READ BACK BY AND VERIFIED WITH: G. ABBOTT,PHARMD 0217 08/28/2019 Mena Goes Performed at [facility], [place] [address], [place], [place] [address]    Culture STAPHYLOCOCCUS AUREUS (A)  Final   Report Status PENDING  Incomplete  Blood Culture ID Panel (Reflexed)     Status: Abnormal   Collection Time: 08/27/19  9:51 AM  Result Value Ref Range Status   Enterococcus species NOT DETECTED NOT DETECTED Final   Listeria monocytogenes NOT DETECTED NOT DETECTED Final   Staphylococcus species DETECTED (A) NOT DETECTED Final    Comment: CRITICAL RESULT CALLED TO, READ BACK BY AND VERIFIED WITH: G. ABBOTT,PHARMD 0217 08/28/2019 T. TYSOR    Staphylococcus aureus (BCID) DETECTED (A) NOT DETECTED Final    Comment: Methicillin (oxacillin) susceptible Staphylococcus aureus (MSSA). Preferred therapy is anti staphylococcal beta lactam antibiotic (Cefazolin or Nafcillin), unless clinically contraindicated. CRITICAL RESULT CALLED TO, READ BACK BY AND VERIFIED WITH: G. ABBOTT,PHARMD 0217 08/28/2019 T. TYSOR    Methicillin resistance NOT DETECTED NOT DETECTED Final   Streptococcus species NOT DETECTED NOT DETECTED Final   Streptococcus agalactiae NOT DETECTED NOT DETECTED Final   Streptococcus pneumoniae NOT DETECTED NOT DETECTED Final   Streptococcus pyogenes NOT DETECTED NOT DETECTED Final   Acinetobacter baumannii NOT DETECTED NOT DETECTED Final   Enterobacteriaceae species NOT DETECTED NOT DETECTED Final   Enterobacter cloacae complex NOT DETECTED NOT DETECTED Final   Escherichia coli NOT DETECTED NOT DETECTED Final   Klebsiella oxytoca NOT DETECTED NOT DETECTED Final   Klebsiella pneumoniae NOT DETECTED  NOT DETECTED Final   Proteus species NOT DETECTED NOT DETECTED Final   Serratia marcescens NOT DETECTED NOT DETECTED Final   Haemophilus influenzae NOT DETECTED NOT DETECTED Final   Neisseria meningitidis NOT DETECTED NOT DETECTED Final   Pseudomonas aeruginosa NOT DETECTED NOT DETECTED Final  Candida albicans NOT DETECTED NOT DETECTED Final   Candida glabrata NOT DETECTED NOT DETECTED Final   Candida krusei NOT DETECTED NOT DETECTED Final   Candida parapsilosis NOT DETECTED NOT DETECTED Final   Candida tropicalis NOT DETECTED NOT DETECTED Final    Comment: Performed at [facility], [place] [address], [place], [place] [address]  MRSA PCR Screening     Status: None   Collection Time: 08/28/19  7:54 AM   Specimen: Nasal Mucosa; Nasopharyngeal  Result Value Ref Range Status   MRSA by PCR NEGATIVE NEGATIVE Final    Comment:        The GeneXpert MRSA Assay (FDA approved for NASAL specimens only), is one component of a comprehensive MRSA colonization surveillance program. It is not intended to diagnose MRSA infection nor to guide or monitor treatment for MRSA infections. Performed at [facility], [place] [address], [place], [place] [address]      W , [facility] [email or web address] 08/28/2019, 12:41 PM

## 2019-08-28 NOTE — Progress Notes (Signed)
Pt spiked fever, has had fevers during previous shift. Also tachycardic & tachypnic at times resulting in MEWS score of 5/RED. MD aware, no new orders. Will initiate red mews VS & continue to monitor.     08/28/19 0923  Vitals  BP 127/82  MAP (mmHg) 93  Pulse Rate (!) 113  ECG Heart Rate (!) 113  Resp (!) 22  Oxygen Therapy  SpO2 90 %  O2 Device Nasal Cannula  O2 Flow Rate (L/min) 4 L/min  MEWS Score  MEWS RR 1  MEWS Pulse 2  MEWS Systolic 0  MEWS LOC 0  MEWS Temp 2  MEWS Score 5  MEWS Score Color Red  Provider Notification  Provider Name/Title Dr Starla Link  Date Provider Notified 08/28/19  Time Provider Notified [phone]  Notification Type Face-to-face  Notification Reason Change in status (fever)  Response No new orders

## 2019-08-28 NOTE — Progress Notes (Signed)
Pt MEWS score 4. Temp 102.1 rectal, RR in mid 20s, HR 116, BP 117/71. Pt alert and oriented x4 . Tylenol given, blood culture positive for staph aureus. Abx initiated.  NP on call notified.

## 2019-08-28 NOTE — Progress Notes (Signed)
PHARMACY - PHYSICIAN COMMUNICATION CRITICAL VALUE ALERT - BLOOD CULTURE IDENTIFICATION (BCID)  [name] [name]. is an 68 y.o. male who presented to [facility] on 08/27/2019 with a chief complaint of back pain  Assessment:  Blood cultures growing MSSA  Name of physician (or Provider) Contacted: C Bodenheimer  Current antibiotics: None  Changes to prescribed antibiotics recommended:  Start Ancef 2 g IV q8h  Results for orders placed or performed during the hospital encounter of 08/27/19  Blood Culture ID Panel (Reflexed) (Collected: 08/27/2019  9:51 AM)  Result Value Ref Range   Enterococcus species NOT DETECTED NOT DETECTED   Listeria monocytogenes NOT DETECTED NOT DETECTED   Staphylococcus species DETECTED (A) NOT DETECTED   Staphylococcus aureus (BCID) DETECTED (A) NOT DETECTED   Methicillin resistance NOT DETECTED NOT DETECTED   Streptococcus species NOT DETECTED NOT DETECTED   Streptococcus agalactiae NOT DETECTED NOT DETECTED   Streptococcus pneumoniae NOT DETECTED NOT DETECTED   Streptococcus pyogenes NOT DETECTED NOT DETECTED   Acinetobacter baumannii NOT DETECTED NOT DETECTED   Enterobacteriaceae species NOT DETECTED NOT DETECTED   Enterobacter cloacae complex NOT DETECTED NOT DETECTED   Escherichia coli NOT DETECTED NOT DETECTED   Klebsiella oxytoca NOT DETECTED NOT DETECTED   Klebsiella pneumoniae NOT DETECTED NOT DETECTED   Proteus species NOT DETECTED NOT DETECTED   Serratia marcescens NOT DETECTED NOT DETECTED   Haemophilus influenzae NOT DETECTED NOT DETECTED   Neisseria meningitidis NOT DETECTED NOT DETECTED   Pseudomonas aeruginosa NOT DETECTED NOT DETECTED   Candida albicans NOT DETECTED NOT DETECTED   Candida glabrata NOT DETECTED NOT DETECTED   Candida krusei NOT DETECTED NOT DETECTED   Candida parapsilosis NOT DETECTED NOT DETECTED   Candida tropicalis NOT DETECTED NOT DETECTED    Caryl Pina 08/28/2019  2:22 AM

## 2019-08-28 NOTE — Progress Notes (Signed)
CPAP settings set according to patients home settings given by patient. Placed patient on CPAP at this time per his request. Touched patients shoulder while tightening mask and patient moaned and reached up stating it was tender to the touch. Patient had already been given pain medicine prior to CPAP. Patient resting comfortably and is aware to call out to desk if needs any assistance.

## 2019-08-28 NOTE — Progress Notes (Signed)
NIF -35 VC 1.6 L  Good patient effort.

## 2019-08-28 NOTE — Progress Notes (Signed)
Pt's wife called, wanted update on Pt. Gave update on Pt's status and let Pt talk to wife on speaker.

## 2019-08-28 NOTE — Progress Notes (Signed)
Patient ID: [name] Renda., male   DOB: 1951/04/04, 68 y.o.   MRN: [id number]  PROGRESS NOTE    [name].  [id number] DOB: [birth year] DOA: 08/27/2019 PCP: Burnis Medin, MD   Brief Narrative:  68 year old male with history of OSA on CPAP, perforated appendicitis in 2015, CAD status post stents, hyperlipidemia, diabetes mellitus type 2, optic neuritis with recent hospitalization at [facility] treated with intravenous and subsequent oral steroids, recent right lower extremity cellulitis treated as an outpatient with oral doxycycline, A. fib on Pradaxa presented with back pain and progressively worsening difficulty walking.  Neurology was consulted.  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  Spoke to Dr. Lindzen/neurology: He thinks that patient might have L4-L5 discitis/osteomyelitis which would explain the bacteremia and fever. -MRI of the cervical and thoracic spine showed degenerative changes without cord signal abnormality -MRI of the brain showed question of punctate acute infarctions.  Dr. Cheral Marker is not convinced about this for now and has recommended CTA of the head and neck.  We will also get 2D echo. -Follow further neurology recommendations.  PT/OT eval. -Trial of Robaxin as per neurology. -Neurology wants to hold off on LP for now  MSSA bacteremia Question of L4-L5 discitis/osteomyelitis -Patient was recently treated for cellulitis with doxycycline as an outpatient.  No current evidence of cellulitis.  Bacteremia might be secondary to discitis/osteomyelitis.  Continue Ancef.  Spoke to Dr. Comer/ID who will evaluate the patient.  Follow repeat blood cultures tomorrow.  2D echo.  Might need neurosurgical evaluation as well. -COVID-19 testing negative  Dehydration Acute kidney injury -Start IV  fluids.  Monitor creatinine  A. fib on Pradaxa -Decrease Tikosyn dose to 250 mg twice a day because of renal function.  Monitor and replace magnesium -Hold Pradaxa in anticipation of need for LP  Diabetes mellitus type 2 -A1c 6.7 on 07/21/2019 -Glucophage held -Continue CBGs with SSI  Hyperlipidemia -Continue Lipitor  OSA -Continue CPAP at bedside  Obesity -Outpatient follow-up  DVT prophylaxis: SCDs Code Status: Full Family Communication: None at bedside Disposition Plan: Depends on clinical outcome  Consultants: Neurology/ID/IR  Procedures: None  Antimicrobials: Ancef from 08/27/2019 onwards   Subjective: Patient seen and examined at bedside.  He apparently received some IV Ativan early this morning and is slightly groggy.  Wakes up slightly, answers only a few questions then goes back to sleep.  As per the nursing staff, patient has been having fevers.  Objective: Vitals:   08/28/19 1022 08/28/19 1053 08/28/19 1123 08/28/19 1152  BP: 108/81 105/76 117/75 111/73  Pulse: (!) 105 (!) 102 98 (!) 102  Resp: (!) 23 (!) 21 16 16   Temp: 99.8 F (37.7 C)   98.5 F (36.9 C)  TempSrc: Axillary   Axillary  SpO2: 91% 91% 92% 95%    Intake/Output Summary (Last 24 hours) at 08/28/2019 1158 Last data filed at 08/28/2019 0600 Gross per 24 hour  Intake 1179.76 ml  Output 450 ml  Net 729.76 ml   There were no vitals filed for this visit.  Examination:  General exam: Appears calm and comfortable.  Intermittently drowsy Respiratory system: Bilateral decreased breath sounds at bases with some scattered crackles Cardiovascular system: S1 & S2 heard, intermittent tachycardia Gastrointestinal system: Abdomen is nondistended, soft and nontender. Normal bowel sounds heard. Extremities: No cyanosis, clubbing, edema  Central nervous system:  Wakes up slightly, does not answer most questions, goes back to sleep.  No focal neurological deficits. Moving extremities Skin: No rashes,  lesions or ulcers Psychiatry: Could not be assessed because of mental status.   Data Reviewed: I have personally reviewed following labs and imaging studies  CBC: Recent Labs  Lab 08/27/19 0908 08/28/19 0415  WBC 12.0* 15.8*  NEUTROABS 9.8*  --   HGB 19.4* 17.9*  HCT 56.4* 51.2  MCV 89.7 89.5  PLT 103* 91*   Basic Metabolic Panel: Recent Labs  Lab 08/27/19 0908 08/28/19 0415  NA 135 131*  K 4.3 4.4  CL 99 97*  CO2 21* 21*  GLUCOSE 173* 230*  BUN 36* 37*  CREATININE 0.97 1.48*  CALCIUM 10.1 9.3   GFR: Estimated Creatinine Clearance: 64 mL/min (A) (by C-G formula based on SCr of 1.48 mg/dL (H)). Liver Function Tests: Recent Labs  Lab 08/27/19 0908 08/28/19 0415  AST 20 22  ALT 48* 40  ALKPHOS 103 112  BILITOT 1.7* 1.8*  PROT 7.5 6.1*  ALBUMIN 3.5 2.9*   No results for input(s): LIPASE, AMYLASE in the last 168 hours. No results for input(s): AMMONIA in the last 168 hours. Coagulation Profile: Recent Labs  Lab 08/27/19 1531  INR 1.1   Cardiac Enzymes: Recent Labs  Lab 08/27/19 1705  CKTOTAL 30*   BNP (last 3 results) No results for input(s): PROBNP in the last 8760 hours. HbA1C: No results for input(s): HGBA1C in the last 72 hours. CBG: Recent Labs  Lab 08/27/19 1707 08/27/19 2132 08/28/19 0825 08/28/19 1136  GLUCAP 263* 184* 219* 198*   Lipid Profile: No results for input(s): CHOL, HDL, LDLCALC, TRIG, CHOLHDL, LDLDIRECT in the last 72 hours. Thyroid Function Tests: Recent Labs    08/27/19 1705  TSH 1.414   Anemia Panel: No results for input(s): VITAMINB12, FOLATE, FERRITIN, TIBC, IRON, RETICCTPCT in the last 72 hours. Sepsis Labs: No results for input(s): PROCALCITON, LATICACIDVEN in the last 168 hours.  Recent Results (from the past 240 hour(s))  Culture, blood (routine x 2)     Status: Abnormal (Preliminary result)   Collection Time: 08/27/19  8:54 AM   Specimen: BLOOD  Result Value Ref Range Status   Specimen Description BLOOD  RIGHT ANTECUBITAL  Final   Special Requests   Final    BOTTLES DRAWN AEROBIC ONLY Blood Culture adequate volume   Culture  Setup Time   Final    GRAM POSITIVE COCCI IN CLUSTERS AEROBIC BOTTLE ONLY CRITICAL RESULT CALLED TO, READ BACK BY AND VERIFIED WITH: G. ABBOTT,PHARMD 0217 08/28/2019 Mena Goes Performed at [facility], [address], [place], [place] [address]    Culture STAPHYLOCOCCUS AUREUS (A)  Final   Report Status PENDING  Incomplete  SARS CORONAVIRUS 2 (TAT 6-12 HRS) Nasal Swab Aptima Multi Swab     Status: None   Collection Time: 08/27/19  9:09 AM   Specimen: Aptima Multi Swab; Nasal Swab  Result Value Ref Range Status   SARS Coronavirus 2 NEGATIVE NEGATIVE Final    Comment: (NOTE) SARS-CoV-2 target nucleic acids are NOT DETECTED. The SARS-CoV-2 RNA is generally detectable in upper and lower respiratory specimens during the acute phase of infection. Negative results do not preclude SARS-CoV-2 infection, do not rule out co-infections with other pathogens, and should not be used as the sole basis for treatment or other patient management decisions. Negative results must be combined with clinical observations, patient history, and epidemiological information. The expected result is Negative.  Fact Sheet for Patients: SugarRoll.be Fact Sheet for Healthcare Providers: https://www.woods-mathews.com/ This test is not yet approved or cleared by the [place] FDA and  has been authorized for detection and/or diagnosis of SARS-CoV-2 by FDA under an Emergency Use Authorization (EUA). This EUA will remain  in effect (meaning this test can be used) for the duration of the COVID-19 declaration under Section 56 4(b)(1) of the Act, 21 U.S.C. section 360bbb-3(b)(1), unless the authorization is terminated or revoked sooner. Performed at [facility], [place] [address], [place], [place] [address]   Culture, blood (routine x 2)      Status: Abnormal (Preliminary result)   Collection Time: 08/27/19  9:51 AM   Specimen: BLOOD RIGHT ARM  Result Value Ref Range Status   Specimen Description BLOOD RIGHT ARM  Final   Special Requests   Final    BOTTLES DRAWN AEROBIC AND ANAEROBIC Blood Culture adequate volume   Culture  Setup Time   Final    GRAM POSITIVE COCCI IN CLUSTERS IN BOTH AEROBIC AND ANAEROBIC BOTTLES CRITICAL RESULT CALLED TO, READ BACK BY AND VERIFIED WITH: G. ABBOTT,PHARMD 0217 08/28/2019 Mena Goes Performed at [facility], [place] [address], [place], [place] [address]    Culture STAPHYLOCOCCUS AUREUS (A)  Final   Report Status PENDING  Incomplete  Blood Culture ID Panel (Reflexed)     Status: Abnormal   Collection Time: 08/27/19  9:51 AM  Result Value Ref Range Status   Enterococcus species NOT DETECTED NOT DETECTED Final   Listeria monocytogenes NOT DETECTED NOT DETECTED Final   Staphylococcus species DETECTED (A) NOT DETECTED Final    Comment: CRITICAL RESULT CALLED TO, READ BACK BY AND VERIFIED WITH: G. ABBOTT,PHARMD 0217 08/28/2019 T. TYSOR    Staphylococcus aureus (BCID) DETECTED (A) NOT DETECTED Final    Comment: Methicillin (oxacillin) susceptible Staphylococcus aureus (MSSA). Preferred therapy is anti staphylococcal beta lactam antibiotic (Cefazolin or Nafcillin), unless clinically contraindicated. CRITICAL RESULT CALLED TO, READ BACK BY AND VERIFIED WITH: G. ABBOTT,PHARMD 0217 08/28/2019 T. TYSOR    Methicillin resistance NOT DETECTED NOT DETECTED Final   Streptococcus species NOT DETECTED NOT DETECTED Final   Streptococcus agalactiae NOT DETECTED NOT DETECTED Final   Streptococcus pneumoniae NOT DETECTED NOT DETECTED Final   Streptococcus pyogenes NOT DETECTED NOT DETECTED Final   Acinetobacter baumannii NOT DETECTED NOT DETECTED Final   Enterobacteriaceae species NOT DETECTED NOT DETECTED Final   Enterobacter cloacae complex NOT DETECTED NOT DETECTED Final   Escherichia coli NOT DETECTED  NOT DETECTED Final   Klebsiella oxytoca NOT DETECTED NOT DETECTED Final   Klebsiella pneumoniae NOT DETECTED NOT DETECTED Final   Proteus species NOT DETECTED NOT DETECTED Final   Serratia marcescens NOT DETECTED NOT DETECTED Final   Haemophilus influenzae NOT DETECTED NOT DETECTED Final   Neisseria meningitidis NOT DETECTED NOT DETECTED Final   Pseudomonas aeruginosa NOT DETECTED NOT DETECTED Final   Candida albicans NOT DETECTED NOT DETECTED Final   Candida glabrata NOT DETECTED NOT DETECTED Final   Candida krusei NOT DETECTED NOT DETECTED Final   Candida parapsilosis NOT DETECTED NOT DETECTED Final   Candida tropicalis NOT DETECTED NOT DETECTED Final    Comment: Performed at [facility], [place]. [address], [place], [place] [address]  MRSA PCR Screening     Status: None   Collection Time: 08/28/19  7:54 AM   Specimen: Nasal Mucosa; Nasopharyngeal  Result Value Ref Range Status   MRSA by PCR NEGATIVE NEGATIVE Final  Comment:        The GeneXpert MRSA Assay (FDA approved for NASAL specimens only), is one component of a comprehensive MRSA colonization surveillance program. It is not intended to diagnose MRSA infection nor to guide or monitor treatment for MRSA infections. Performed at [facility], [place] [address] [place],  [address]          Radiology Studies: Mr Jeri Cos [id number] Contrast  Result Date: 08/28/2019 CLINICAL DATA:  Worsening low back pain and weakness. EXAM: MRI HEAD WITHOUT AND WITH CONTRAST TECHNIQUE: Multiplanar, multiecho pulse sequences of the brain and surrounding structures were obtained without and with intravenous contrast. CONTRAST:  10 cc Gadavist COMPARISON:  06/05/2018 FINDINGS: Brain: Diffusion imaging shows a few punctate foci of restricted diffusion, 2 in the right thalamus in 1 in the splenium of the corpus callosum to the left of midline. These could be due to micro embolic infarctions in the posterior circulation. The brainstem is  normal. Few old small vessel cerebellar infarctions. Cerebral hemispheres show mild chronic small-vessel ischemic change of the deep white matter. No mass lesion, hemorrhage, hydrocephalus or extra-axial collection. After contrast administration, no abnormal enhancement occurs. Vascular: Major vessels at the base of the brain show flow. Skull and upper cervical spine: Negative Sinuses/Orbits: Clear/normal Other: Question presence of a posterior nasopharyngeal mass on the right. Suggest ENT consultation. This could be a benign polyp or a malignancy. 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 These could be due to micro embolic infarctions in the posterior circulation. Mild chronic small-vessel ischemic changes of the brain elsewhere as seen previously. Probable posterior nasopharyngeal polyp or mass lesion on the right. ENT referral suggested at some point. Electronically Signed   By: Nelson Chimes M.D.   On: 08/28/2019 07:00   Mr Cervical Spine W Wo Contrast  Result Date: 08/28/2019 CLINICAL DATA:  Weakness.  Myelopathy. EXAM: MRI CERVICAL SPINE WITHOUT AND WITH CONTRAST TECHNIQUE: Multiplanar and multiecho pulse sequences of the cervical spine, to include the craniocervical junction and cervicothoracic junction, were obtained without and with intravenous contrast. CONTRAST:  10 cc MultiHance COMPARISON:  None. FINDINGS: Alignment: Mild straightening of the normal cervical lordosis. Vertebrae: No fracture or primary bone lesion. Cord: No cord compression or primary cord lesion. Posterior Fossa, vertebral arteries, paraspinal tissues: See results of brain MRI. Disc levels: No significant finding at the foramen magnum, C1-2 or C2-3. C3-4: Spondylosis without compressive central canal stenosis. Foraminal narrowing left worse than right. This could affect the left C4 nerve. C4-5: Spondylosis with canal narrowing and effacement of the subarachnoid  space surrounding the cord. AP diameter of the canal 10 mm. No cord compression. Bilateral foraminal narrowing that could affect either C5 nerve. C5-6: Spondylosis with narrowing the canal but no cord compression. AP diameter 1 cm. Bilateral foraminal narrowing that could affect either C6 nerve. C6-7: Spondylosis with narrowing of the ventral subarachnoid space. AP diameter of the canal 11 mm. Bilateral foraminal narrowing that could affect either C7 nerve. C7-T1: Facet osteoarthritis on the left. No canal stenosis. Mild left foraminal stenosis. IMPRESSION: No primary cord lesion or cord compression. Ordinary cervical spondylosis. No compressive central canal stenosis. On the left at C3-4, bilateral at C4-5, bilateral at C5-6, bilateral at C6-7 and on the left at C7-T1. Electronically Signed   By: Nelson Chimes M.D.   On: 08/28/2019 07:04   Mr Thoracic Spine W Wo Contrast  Result Date: 08/28/2019 CLINICAL  DATA:  Worsening back pain with recent abdominal aortic aneurysm diagnosis. Difficulty getting out of bed. EXAM: MRI THORACIC AND LUMBAR SPINE WITHOUT AND WITH CONTRAST TECHNIQUE: Multiplanar and multiecho pulse sequences of the thoracic and lumbar spine were obtained without and with intravenous contrast. CONTRAST:  10 mL Gadavist COMPARISON:  None. FINDINGS: MRI THORACIC SPINE FINDINGS Alignment:  Physiologic. Vertebrae: No fracture, evidence of discitis, or bone lesion. Cord:  Normal signal and morphology. Paraspinal and other soft tissues: Bilateral dependent atelectasis. Disc levels: T8-9: Small central disc protrusion without stenosis. T9-10: Small right subarticular disc protrusion without stenosis. T11-12: Left subarticular disc protrusion without stenosis. MRI LUMBAR SPINE FINDINGS Segmentation:  Standard. Alignment:  Physiologic. Vertebrae: Hyperintense T2-weighted signal with contrast enhancement at L4-5, likely degenerative. Conus medullaris: Extends to the L2 level and appears normal. Paraspinal and  other soft tissues: Negative Disc levels: L1-2: No disc herniation or stenosis. L2-3: Intermediate disc bulge with mild spinal canal stenosis. Mild right foraminal stenosis. L3-4: Right eccentric disc bulge with narrowing of the right lateral recess and mild right foraminal stenosis. L4-5: Mild disc edema with left-greater-than-right facet arthrosis. Small amount of fluid in the left facet. Left-greater-than-right lateral recess stenosis. Severe left and moderate right foraminal stenosis. L5-S1: Intermediate disc bulge with endplate spurring. Moderate bilateral neural foraminal stenosis. No central spinal canal stenosis. IMPRESSION: 1. No spinal cord or cauda equina abnormality. 2. Signal changes and contrast enhancement of the bone marrow at L4-5, favored to be degenerative. No height loss or endplate erosion. 3. Multilevel moderate-to-severe lumbar neural foraminal stenosis. 4. 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 Electronically Signed   By: Ulyses Jarred M.D.   On: 08/28/2019 06:50   Mr Lumbar Spine W Wo Contrast  Result Date: 08/28/2019 CLINICAL DATA:  Worsening back pain with recent abdominal aortic aneurysm diagnosis. Difficulty getting out of bed. EXAM: MRI THORACIC AND LUMBAR SPINE WITHOUT AND WITH CONTRAST TECHNIQUE: Multiplanar and multiecho pulse sequences of the thoracic and lumbar spine were obtained without and with intravenous contrast. CONTRAST:  10 mL Gadavist COMPARISON:  None. FINDINGS: MRI THORACIC SPINE FINDINGS Alignment:  Physiologic. Vertebrae: No fracture, evidence of discitis, or bone lesion. Cord:  Normal signal and morphology. Paraspinal and other soft tissues: Bilateral dependent atelectasis. Disc levels: T8-9: Small central disc protrusion without stenosis. T9-10: Small right subarticular disc protrusion without stenosis. T11-12: Left subarticular disc protrusion without stenosis. MRI LUMBAR SPINE FINDINGS Segmentation:  Standard. Alignment:  Physiologic.  Vertebrae: Hyperintense T2-weighted signal with contrast enhancement at L4-5, likely degenerative. Conus medullaris: Extends to the L2 level and appears normal. Paraspinal and other soft tissues: Negative Disc levels: L1-2: No disc herniation or stenosis. L2-3: Intermediate disc bulge with mild spinal canal stenosis. Mild right foraminal stenosis. L3-4: Right eccentric disc bulge with narrowing of the right lateral recess and mild right foraminal stenosis. L4-5: Mild disc edema with left-greater-than-right facet arthrosis. Small amount of fluid in the left facet. Left-greater-than-right lateral recess stenosis. Severe left and moderate right foraminal stenosis. L5-S1: Intermediate disc bulge with endplate spurring. Moderate bilateral neural foraminal stenosis. No central spinal canal stenosis. IMPRESSION: 1. No spinal cord or cauda equina abnormality. 2. Signal changes and contrast enhancement of the bone marrow at L4-5, favored to be degenerative. No height loss or endplate erosion. 3. Multilevel moderate-to-severe lumbar neural foraminal stenosis. 4. 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 Electronically Signed   By: Ulyses Jarred M.D.   On: 08/28/2019 06:50        Scheduled Meds: . acetaminophen  325 mg Oral Once  . aspirin EC  81 mg Oral Daily  . atorvastatin  80 mg Oral QHS  . brimonidine  1 drop Both Eyes TID  . docusate sodium  100 mg Oral BID  . dofetilide  500 mcg Oral BID  . insulin aspart  0-15 Units Subcutaneous TID WC  . insulin aspart  0-5 Units Subcutaneous QHS  . ipratropium-albuterol  3 mL Nebulization TID  . loratadine  10 mg Oral Daily  . pantoprazole  40 mg Oral Daily  . Suvorexant  1 tablet Oral QHS   Continuous Infusions: . sodium chloride 75 mL/hr at 08/28/19 0924  .  ceFAZolin (ANCEF) IV 2 g (08/28/19 0334)  . methocarbamol (ROBAXIN) IV 1,000 mg (08/28/19 0928)     LOS: 1 day        Aline August, MD [facility] 08/28/2019, 11:58 AM

## 2019-08-29 ENCOUNTER — Inpatient Hospital Stay (HOSPITAL_COMMUNITY): Payer: Medicare Other

## 2019-08-29 DIAGNOSIS — R7881 Bacteremia: Secondary | ICD-10-CM

## 2019-08-29 LAB — CBC WITH DIFFERENTIAL/PLATELET
Abs Immature Granulocytes: 0.16 10*3/uL — ABNORMAL HIGH (ref 0.00–0.07)
Basophils Absolute: 0.1 10*3/uL (ref 0.0–0.1)
Basophils Relative: 0 %
Eosinophils Absolute: 0.1 10*3/uL (ref 0.0–0.5)
Eosinophils Relative: 1 %
HCT: 46.7 % (ref 39.0–52.0)
Hemoglobin: 16 g/dL (ref 13.0–17.0)
Immature Granulocytes: 1 %
Lymphocytes Relative: 12 %
Lymphs Abs: 1.5 10*3/uL (ref 0.7–4.0)
MCH: 30.9 pg (ref 26.0–34.0)
MCHC: 34.3 g/dL (ref 30.0–36.0)
MCV: 90.2 fL (ref 80.0–100.0)
Monocytes Absolute: 1.5 10*3/uL — ABNORMAL HIGH (ref 0.1–1.0)
Monocytes Relative: 12 %
Neutro Abs: 9.6 10*3/uL — ABNORMAL HIGH (ref 1.7–7.7)
Neutrophils Relative %: 74 %
Platelets: 73 10*3/uL — ABNORMAL LOW (ref 150–400)
RBC: 5.18 MIL/uL (ref 4.22–5.81)
RDW: 13.8 % (ref 11.5–15.5)
WBC: 12.9 10*3/uL — ABNORMAL HIGH (ref 4.0–10.5)
nRBC: 0 % (ref 0.0–0.2)

## 2019-08-29 LAB — GLUCOSE, CAPILLARY
Glucose-Capillary: 153 mg/dL — ABNORMAL HIGH (ref 70–99)
Glucose-Capillary: 141 mg/dL — ABNORMAL HIGH (ref 70–99)
Glucose-Capillary: 146 mg/dL — ABNORMAL HIGH (ref 70–99)

## 2019-08-29 LAB — COMPREHENSIVE METABOLIC PANEL
ALT: 40 U/L (ref 0–44)
AST: 24 U/L (ref 15–41)
Albumin: 2.4 g/dL — ABNORMAL LOW (ref 3.5–5.0)
Alkaline Phosphatase: 92 U/L (ref 38–126)
Anion gap: 10 (ref 5–15)
BUN: 30 mg/dL — ABNORMAL HIGH (ref 8–23)
CO2: 24 mmol/L (ref 22–32)
Calcium: 9.2 mg/dL (ref 8.9–10.3)
Chloride: 100 mmol/L (ref 98–111)
Creatinine, Ser: 0.97 mg/dL (ref 0.61–1.24)
GFR calc Af Amer: >60 mL/min (ref >60)
GFR calc non Af Amer: >60 mL/min (ref >60)
Glucose, Bld: 158 mg/dL — ABNORMAL HIGH (ref 70–99)
Potassium: 4 mmol/L (ref 3.5–5.1)
Sodium: 134 mmol/L — ABNORMAL LOW (ref 135–145)
Total Bilirubin: 1.1 mg/dL (ref 0.3–1.2)
Total Protein: 5.4 g/dL — ABNORMAL LOW (ref 6.5–8.1)

## 2019-08-29 LAB — CULTURE, BLOOD (ROUTINE X 2)
Special Requests: ADEQUATE
Special Requests: ADEQUATE

## 2019-08-29 LAB — MAGNESIUM: Magnesium: 2 mg/dL (ref 1.7–2.4)

## 2019-08-29 LAB — ECHOCARDIOGRAM COMPLETE: Weight: 4162.28 oz

## 2019-08-29 MED ORDER — POLYETHYLENE GLYCOL 3350 17 G PO PACK
17.0000 g | PACK | Freq: Two times a day (BID) | ORAL | Status: DC
  Administered 2019-08-29 – 2019-09-14 (×15): 17 g via ORAL
  Filled 2019-08-29 (×30): qty 1

## 2019-08-29 MED ORDER — DOFETILIDE 250 MCG PO CAPS
500.0000 ug | ORAL_CAPSULE | Freq: Two times a day (BID) | ORAL | Status: DC
  Administered 2019-08-29 – 2019-09-15 (×34): 500 ug via ORAL
  Filled 2019-08-29 (×35): qty 2

## 2019-08-29 MED ORDER — BISACODYL 10 MG RE SUPP
10.0000 mg | Freq: Every day | RECTAL | Status: DC | PRN
  Administered 2019-09-01: 10 mg via RECTAL
  Filled 2019-08-29 (×2): qty 1

## 2019-08-29 MED ORDER — IOHEXOL 350 MG/ML SOLN
75.0000 mL | Freq: Once | INTRAVENOUS | Status: AC | PRN
  Administered 2019-08-29: 75 mL via INTRAVENOUS

## 2019-08-29 MED ORDER — SENNOSIDES-DOCUSATE SODIUM 8.6-50 MG PO TABS
1.0000 | ORAL_TABLET | Freq: Two times a day (BID) | ORAL | Status: DC
  Administered 2019-08-29 – 2019-09-15 (×25): 1 via ORAL
  Filled 2019-08-29 (×32): qty 1

## 2019-08-29 NOTE — Progress Notes (Addendum)
VC 3.0L NIF -20  Pt drowsy from meds and did not have great effort   Pt placed on CPAP. Pressure increased to  20cm H2o(per home settings)  with 4L Bleed in.

## 2019-08-29 NOTE — Progress Notes (Signed)
Patient ID: [name] Stash., male   DOB: 1951-10-24, 68 y.o.   MRN: [id number]  PROGRESS NOTE    [name] Chimes.  [id number] DOB: 07-07-1951 DOA: 08/27/2019 PCP: Burnis Medin, MD   Brief Narrative:  68 year old male with history of OSA on CPAP, perforated appendicitis in 2015, CAD status post stents, hyperlipidemia, diabetes mellitus type 2, optic neuritis with recent hospitalization at [facility] treated with intravenous and subsequent oral steroids, recent right lower extremity cellulitis treated as an outpatient with oral doxycycline, A. fib on Pradaxa presented with back pain and progressively worsening difficulty walking.  Neurology was consulted.  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  Spoke to Dr. Lindzen/neurology on 08/28/2019: He thinks that patient might have L4-L5 discitis/osteomyelitis which would explain the bacteremia and fever. -MRI of the cervical and thoracic spine showed degenerative changes without cord signal abnormality -MRI of the brain showed question of punctate acute infarctions.  Dr. Cheral Marker is not convinced about this for now and has recommended CTA of the head and neck.  Echo pending. -Follow further neurology recommendations.  PT/OT eval. -Trial of Robaxin as per neurology. -Neurology wants to hold off on LP for now  MSSA bacteremia Question of L4-L5 discitis/osteomyelitis -Patient was recently treated for cellulitis with doxycycline as an outpatient.  No current evidence of cellulitis.  Bacteremia might be secondary to discitis/osteomyelitis.  Continue Ancef.  ID evaluation appreciated.  Follow repeat blood cultures.  Follow 2D echo.  Spoke to neurosurgery/Dr. Annette Stable on phone on 08/28/2019 and he reviewed the MRI lumbar spine and recommended no surgical intervention at this  time. -COVID-19 testing negative  Leukocytosis -Improving.  Monitor  Dehydration Acute kidney injury -Creatinine improving.  DC IV fluids.  A. fib on Pradaxa -Continue Tikosyn renal dosed.  Magnesium 2 today. -Hold Pradaxa in anticipation of need for LP  Diabetes mellitus type 2 -A1c 6.7 on 07/21/2019 -Glucophage held -Continue CBGs with SSI  Hyperlipidemia -Continue Lipitor  OSA -Continue CPAP at bedside  Obesity -Outpatient follow-up  DVT prophylaxis: SCDs Code Status: Full Family Communication: None at bedside Disposition Plan: Depends on clinical outcome  Consultants: Neurology/ID/IR.  Spoke to neurosurgery/Dr. Annette Stable on 08/28/2019.  Procedures: None  Antimicrobials: Ancef from 08/27/2019 onwards   Subjective: Patient seen and examined at bedside.  More awake this morning and answers some questions.  No overnight fever or vomiting.  Still feels weak and complains of back pain.  States that he has been constipated for 3 weeks. Objective: Vitals:   08/29/19 0213 08/29/19 0416 08/29/19 0500 08/29/19 0750  BP:  114/72    Pulse: 85 85    Resp: 16 17    Temp:  97.9 F (36.6 C)    TempSrc:  Axillary    SpO2: 93% 93%  92%  Weight:   118 kg     Intake/Output Summary (Last 24 hours) at 08/29/2019 0755 Last data filed at 08/29/2019 0634 Gross per 24 hour  Intake 705.4 ml  Output 1450 ml  Net -744.6 ml   Filed Weights   08/29/19 0500  Weight: 118 kg    Examination:  General exam: Appears calm and comfortable.  No acute distress.  More awake this morning. Respiratory system: Bilateral decreased breath sounds at bases with slight crackles, no wheezing  cardiovascular system: S1 & S2 heard, rate controlled  gastrointestinal system: Abdomen is  nondistended, soft and nontender. Normal bowel sounds heard. Extremities: No cyanosis, clubbing; trace edema Central nervous system: More awake and answers questions.  No focal neurological deficits. Moving  extremities Skin: No rashes, lesions or ulcers Psychiatry: Slightly anxious.  Data Reviewed: I have personally reviewed following labs and imaging studies  CBC: Recent Labs  Lab 08/27/19 0908 08/28/19 0415 08/29/19 0548  WBC 12.0* 15.8* 12.9*  NEUTROABS 9.8*  --  9.6*  HGB 19.4* 17.9* 16.0  HCT 56.4* 51.2 46.7  MCV 89.7 89.5 90.2  PLT 103* 91* 73*   Basic Metabolic Panel: Recent Labs  Lab 08/27/19 0908 08/28/19 0415 08/29/19 0548  NA 135 131* 134*  K 4.3 4.4 4.0  CL 99 97* 100  CO2 21* 21* 24  GLUCOSE 173* 230* 158*  BUN 36* 37* 30*  CREATININE 0.97 1.48* 0.97  CALCIUM 10.1 9.3 9.2  MG  --   --  2.0   GFR: Estimated Creatinine Clearance: 96.7 mL/min (by C-G formula based on SCr of 0.97 mg/dL). Liver Function Tests: Recent Labs  Lab 08/27/19 0908 08/28/19 0415 08/29/19 0548  AST 20 22 24   ALT 48* 40 40  ALKPHOS 103 112 92  BILITOT 1.7* 1.8* 1.1  PROT 7.5 6.1* 5.4*  ALBUMIN 3.5 2.9* 2.4*   No results for input(s): LIPASE, AMYLASE in the last 168 hours. No results for input(s): AMMONIA in the last 168 hours. Coagulation Profile: Recent Labs  Lab 08/27/19 1531  INR 1.1   Cardiac Enzymes: Recent Labs  Lab 08/27/19 1705  CKTOTAL 30*   BNP (last 3 results) No results for input(s): PROBNP in the last 8760 hours. HbA1C: No results for input(s): HGBA1C in the last 72 hours. CBG: Recent Labs  Lab 08/27/19 2132 08/28/19 0825 08/28/19 1136 08/28/19 1644 08/28/19 2057  GLUCAP 184* 219* 198* 183* 159*   Lipid Profile: No results for input(s): CHOL, HDL, LDLCALC, TRIG, CHOLHDL, LDLDIRECT in the last 72 hours. Thyroid Function Tests: Recent Labs    08/27/19 1705  TSH 1.414   Anemia Panel: No results for input(s): VITAMINB12, FOLATE, FERRITIN, TIBC, IRON, RETICCTPCT in the last 72 hours. Sepsis Labs: No results for input(s): PROCALCITON, LATICACIDVEN in the last 168 hours.  Recent Results (from the past 240 hour(s))  Culture, blood (routine x  2)     Status: Abnormal (Preliminary result)   Collection Time: 08/27/19  8:54 AM   Specimen: BLOOD  Result Value Ref Range Status   Specimen Description BLOOD RIGHT ANTECUBITAL  Final   Special Requests   Final    BOTTLES DRAWN AEROBIC ONLY Blood Culture adequate volume   Culture  Setup Time   Final    GRAM POSITIVE COCCI IN CLUSTERS AEROBIC BOTTLE ONLY CRITICAL RESULT CALLED TO, READ BACK BY AND VERIFIED WITH: G. ABBOTT,PHARMD 0217 08/28/2019 [name] [name] Performed at [facility], [address] [place], [place] [address]    Culture STAPHYLOCOCCUS AUREUS (A)  Final   Report Status PENDING  Incomplete  SARS CORONAVIRUS 2 (TAT 6-12 HRS) Nasal Swab Aptima Multi Swab     Status: None   Collection Time: 08/27/19  9:09 AM   Specimen: Aptima Multi Swab; Nasal Swab  Result Value Ref Range Status   SARS Coronavirus 2 NEGATIVE NEGATIVE Final    Comment: (NOTE) SARS-CoV-2 target nucleic acids are NOT DETECTED. The SARS-CoV-2 RNA is generally detectable in upper and lower respiratory specimens during the acute phase of infection. Negative results do not preclude SARS-CoV-2 infection, do not rule  out co-infections with other pathogens, and should not be used as the sole basis for treatment or other patient management decisions. Negative results must be combined with clinical observations, patient history, and epidemiological information. The expected result is Negative. Fact Sheet for Patients: SugarRoll.be Fact Sheet for Healthcare Providers: https://www.woods-mathews.com/ This test is not yet approved or cleared by the [place] FDA and  has been authorized for detection and/or diagnosis of SARS-CoV-2 by FDA under an Emergency Use Authorization (EUA). This EUA will remain  in effect (meaning this test can be used) for the duration of the COVID-19 declaration under Section 56 4(b)(1) of the Act, 21 U.S.C. section 360bbb-3(b)(1), unless the  authorization is terminated or revoked sooner. Performed at [facility], [place] [address] [place], [place] [address]   Culture, blood (routine x 2)     Status: Abnormal (Preliminary result)   Collection Time: 08/27/19  9:51 AM   Specimen: BLOOD RIGHT ARM  Result Value Ref Range Status   Specimen Description BLOOD RIGHT ARM  Final   Special Requests   Final    BOTTLES DRAWN AEROBIC AND ANAEROBIC Blood Culture adequate volume   Culture  Setup Time   Final    GRAM POSITIVE COCCI IN CLUSTERS IN BOTH AEROBIC AND ANAEROBIC BOTTLES CRITICAL RESULT CALLED TO, READ BACK BY AND VERIFIED WITH: G. ABBOTT,PHARMD 0217 08/28/2019 [name] [name] Performed at [facility], [place] [address], [place], [place] [address]    Culture STAPHYLOCOCCUS AUREUS (A)  Final   Report Status PENDING  Incomplete  Blood Culture ID Panel (Reflexed)     Status: Abnormal   Collection Time: 08/27/19  9:51 AM  Result Value Ref Range Status   Enterococcus species NOT DETECTED NOT DETECTED Final   Listeria monocytogenes NOT DETECTED NOT DETECTED Final   Staphylococcus species DETECTED (A) NOT DETECTED Final    Comment: CRITICAL RESULT CALLED TO, READ BACK BY AND VERIFIED WITH: G. ABBOTT,PHARMD 0217 08/28/2019 T. TYSOR    Staphylococcus aureus (BCID) DETECTED (A) NOT DETECTED Final    Comment: Methicillin (oxacillin) susceptible Staphylococcus aureus (MSSA). Preferred therapy is anti staphylococcal beta lactam antibiotic (Cefazolin or Nafcillin), unless clinically contraindicated. CRITICAL RESULT CALLED TO, READ BACK BY AND VERIFIED WITH: G. ABBOTT,PHARMD 0217 08/28/2019 T. TYSOR    Methicillin resistance NOT DETECTED NOT DETECTED Final   Streptococcus species NOT DETECTED NOT DETECTED Final   Streptococcus agalactiae NOT DETECTED NOT DETECTED Final   Streptococcus pneumoniae NOT DETECTED NOT DETECTED Final   Streptococcus pyogenes NOT DETECTED NOT DETECTED Final   Acinetobacter baumannii NOT DETECTED NOT DETECTED  Final   Enterobacteriaceae species NOT DETECTED NOT DETECTED Final   Enterobacter cloacae complex NOT DETECTED NOT DETECTED Final   Escherichia coli NOT DETECTED NOT DETECTED Final   Klebsiella oxytoca NOT DETECTED NOT DETECTED Final   Klebsiella pneumoniae NOT DETECTED NOT DETECTED Final   Proteus species NOT DETECTED NOT DETECTED Final   Serratia marcescens NOT DETECTED NOT DETECTED Final   Haemophilus influenzae NOT DETECTED NOT DETECTED Final   Neisseria meningitidis NOT DETECTED NOT DETECTED Final   Pseudomonas aeruginosa NOT DETECTED NOT DETECTED Final   Candida albicans NOT DETECTED NOT DETECTED Final   Candida glabrata NOT DETECTED NOT DETECTED Final   Candida krusei NOT DETECTED NOT DETECTED Final   Candida parapsilosis NOT DETECTED NOT DETECTED Final   Candida tropicalis NOT DETECTED NOT DETECTED Final    Comment: Performed at [facility], [place]. [address] [place], [place] [address]  MRSA PCR Screening     Status: None   Collection Time: 08/28/19  7:54 AM   Specimen: Nasal Mucosa; Nasopharyngeal  Result Value Ref Range Status   MRSA by PCR NEGATIVE NEGATIVE Final    Comment:        The GeneXpert MRSA Assay (FDA approved for NASAL specimens only), is one component of a comprehensive MRSA colonization surveillance program. It is not intended to diagnose MRSA infection nor to guide or monitor treatment for MRSA infections. Performed at [facility], [place] [address], [place], [place] [address]          Radiology Studies: Mr Jeri Cos [id number] Contrast  Result Date: 08/28/2019 CLINICAL DATA:  Worsening low back pain and weakness. EXAM: MRI HEAD WITHOUT AND WITH CONTRAST TECHNIQUE: Multiplanar, multiecho pulse sequences of the brain and surrounding structures were obtained without and with intravenous contrast. CONTRAST:  10 cc Gadavist COMPARISON:  06/05/2018 FINDINGS: Brain: Diffusion imaging shows a few punctate foci of restricted diffusion, 2 in the right  thalamus in 1 in the splenium of the corpus callosum to the left of midline. These could be due to micro embolic infarctions in the posterior circulation. The brainstem is normal. Few old small vessel cerebellar infarctions. Cerebral hemispheres show mild chronic small-vessel ischemic change of the deep white matter. No mass lesion, hemorrhage, hydrocephalus or extra-axial collection. After contrast administration, no abnormal enhancement occurs. Vascular: Major vessels at the base of the brain show flow. Skull and upper cervical spine: Negative Sinuses/Orbits: Clear/normal Other: Question presence of a posterior nasopharyngeal mass on the right. Suggest ENT consultation. This could be a benign polyp or a malignancy. 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 These could be due to micro embolic infarctions in the posterior circulation. Mild chronic small-vessel ischemic changes of the brain elsewhere as seen previously. Probable posterior nasopharyngeal polyp or mass lesion on the right. ENT referral suggested at some point. Electronically Signed   By: Nelson Chimes M.D.   On: 08/28/2019 07:00   Mr Cervical Spine W Wo Contrast  Result Date: 08/28/2019 CLINICAL DATA:  Weakness.  Myelopathy. EXAM: MRI CERVICAL SPINE WITHOUT AND WITH CONTRAST TECHNIQUE: Multiplanar and multiecho pulse sequences of the cervical spine, to include the craniocervical junction and cervicothoracic junction, were obtained without and with intravenous contrast. CONTRAST:  10 cc MultiHance COMPARISON:  None. FINDINGS: Alignment: Mild straightening of the normal cervical lordosis. Vertebrae: No fracture or primary bone lesion. Cord: No cord compression or primary cord lesion. Posterior Fossa, vertebral arteries, paraspinal tissues: See results of brain MRI. Disc levels: No significant finding at the foramen magnum, C1-2 or C2-3. C3-4: Spondylosis without compressive central  canal stenosis. Foraminal narrowing left worse than right. This could affect the left C4 nerve. C4-5: Spondylosis with canal narrowing and effacement of the subarachnoid space surrounding the cord. AP diameter of the canal 10 mm. No cord compression. Bilateral foraminal narrowing that could affect either C5 nerve. C5-6: Spondylosis with narrowing the canal but no cord compression. AP diameter 1 cm. Bilateral foraminal narrowing that could affect either C6 nerve. C6-7: Spondylosis with narrowing of the ventral subarachnoid space. AP diameter of the canal 11 mm. Bilateral foraminal narrowing that could affect either C7 nerve. C7-T1: Facet osteoarthritis on the left. No canal stenosis. Mild left foraminal stenosis. IMPRESSION: No primary cord lesion or cord compression. Ordinary cervical spondylosis. No compressive central canal stenosis. On the left at C3-4, bilateral at  C4-5, bilateral at C5-6, bilateral at C6-7 and on the left at C7-T1. Electronically Signed   By: Nelson Chimes M.D.   On: 08/28/2019 07:04   Mr Thoracic Spine W Wo Contrast  Addendum Date: 08/28/2019   ADDENDUM REPORT: 08/28/2019 13:29 ADDENDUM: Additional clinical history has been provided. The patient has MSSA bacteremia. 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 These results were discussed by telephone on 08/28/2019 at 1:00 pm to Dr. Aline August, who verbally acknowledged these results. Electronically Signed   By: Titus Dubin M.D.   On: 08/28/2019 13:29   Result Date: 08/28/2019 CLINICAL DATA:  Worsening back pain with recent abdominal aortic aneurysm diagnosis. Difficulty getting out of bed. EXAM: MRI THORACIC AND LUMBAR SPINE WITHOUT AND WITH CONTRAST TECHNIQUE: Multiplanar and multiecho pulse sequences of the thoracic and lumbar spine were obtained without and with intravenous contrast. CONTRAST:  10 mL Gadavist COMPARISON:   None. FINDINGS: MRI THORACIC SPINE FINDINGS Alignment:  Physiologic. Vertebrae: No fracture, evidence of discitis, or bone lesion. Cord:  Normal signal and morphology. Paraspinal and other soft tissues: Bilateral dependent atelectasis. Disc levels: T8-9: Small central disc protrusion without stenosis. T9-10: Small right subarticular disc protrusion without stenosis. T11-12: Left subarticular disc protrusion without stenosis. MRI LUMBAR SPINE FINDINGS Segmentation:  Standard. Alignment:  Physiologic. Vertebrae: Hyperintense T2-weighted signal with contrast enhancement at L4-5, likely degenerative. Conus medullaris: Extends to the L2 level and appears normal. Paraspinal and other soft tissues: Negative Disc levels: L1-2: No disc herniation or stenosis. L2-3: Intermediate disc bulge with mild spinal canal stenosis. Mild right foraminal stenosis. L3-4: Right eccentric disc bulge with narrowing of the right lateral recess and mild right foraminal stenosis. L4-5: Mild disc edema with left-greater-than-right facet arthrosis. Small amount of fluid in the left facet. Left-greater-than-right lateral recess stenosis. Severe left and moderate right foraminal stenosis. L5-S1: Intermediate disc bulge with endplate spurring. Moderate bilateral neural foraminal stenosis. No central spinal canal stenosis. IMPRESSION: 1. No spinal cord or cauda equina abnormality. 2. Signal changes and contrast enhancement of the bone marrow at L4-5, favored to be degenerative. No height loss or endplate erosion. 3. Multilevel moderate-to-severe lumbar neural foraminal stenosis. 4. 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 Electronically Signed: By: Ulyses Jarred M.D. On: 08/28/2019 06:50   Mr Lumbar Spine W Wo Contrast  Addendum Date: 08/28/2019   ADDENDUM REPORT: 08/28/2019 13:29 ADDENDUM: Additional clinical history has been provided. The patient has MSSA bacteremia. In light of this, the findings at L4-L5 are most concerning  for osteomyelitis discitis, especially given the mild paravertebral inflammatory changes. These results were discussed by telephone on 08/28/2019 at 1:00 pm to Dr. Aline August, who verbally acknowledged these results. Electronically Signed   By: Titus Dubin M.D.   On: 08/28/2019 13:29   Result Date: 08/28/2019 CLINICAL DATA:  Worsening back pain with recent abdominal aortic aneurysm diagnosis. Difficulty getting out of bed. EXAM: MRI THORACIC AND LUMBAR SPINE WITHOUT AND WITH CONTRAST TECHNIQUE: Multiplanar and multiecho pulse sequences of the thoracic and lumbar spine were obtained without and with intravenous contrast. CONTRAST:  10 mL Gadavist COMPARISON:  None. FINDINGS: MRI THORACIC SPINE FINDINGS Alignment:  Physiologic. Vertebrae: No fracture, evidence of discitis, or bone lesion. Cord:  Normal signal and morphology. Paraspinal and other soft tissues: Bilateral dependent atelectasis. Disc levels: T8-9: Small central disc protrusion without stenosis. T9-10: Small right subarticular disc protrusion without stenosis. T11-12: Left subarticular disc protrusion  without stenosis. MRI LUMBAR SPINE FINDINGS Segmentation:  Standard. Alignment:  Physiologic. Vertebrae: Hyperintense T2-weighted signal with contrast enhancement at L4-5, likely degenerative. Conus medullaris: Extends to the L2 level and appears normal. Paraspinal and other soft tissues: Negative Disc levels: L1-2: No disc herniation or stenosis. L2-3: Intermediate disc bulge with mild spinal canal stenosis. Mild right foraminal stenosis. L3-4: Right eccentric disc bulge with narrowing of the right lateral recess and mild right foraminal stenosis. L4-5: Mild disc edema with left-greater-than-right facet arthrosis. Small amount of fluid in the left facet. Left-greater-than-right lateral recess stenosis. Severe left and moderate right foraminal stenosis. L5-S1: Intermediate disc bulge with endplate spurring. Moderate bilateral neural foraminal  stenosis. No central spinal canal stenosis. IMPRESSION: 1. No spinal cord or cauda equina abnormality. 2. Signal changes and contrast enhancement of the bone marrow at L4-5, favored to be degenerative. No height loss or endplate erosion. 3. Multilevel moderate-to-severe lumbar neural foraminal stenosis. 4. 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 Electronically Signed: By: Ulyses Jarred M.D. On: 08/28/2019 06:50        Scheduled Meds: . acetaminophen  325 mg Oral Once  . atorvastatin  80 mg Oral QHS  . brimonidine  1 drop Both Eyes TID  . docusate sodium  100 mg Oral BID  . dofetilide  250 mcg Oral BID  . insulin aspart  0-15 Units Subcutaneous TID WC  . insulin aspart  0-5 Units Subcutaneous QHS  . ipratropium-albuterol  3 mL Nebulization TID  . loratadine  10 mg Oral Daily  . pantoprazole  40 mg Oral Daily  . Suvorexant  1 tablet Oral QHS   Continuous Infusions: . sodium chloride 75 mL/hr at 08/28/19 0924  .  ceFAZolin (ANCEF) IV 2 g (08/29/19 [id number])  . methocarbamol (ROBAXIN) IV 1,000 mg (08/29/19 0343)     LOS: 2 days        Aline August, MD [facility] 08/29/2019, 7:55 AM

## 2019-08-29 NOTE — Progress Notes (Signed)
OT Cancellation Note  Patient Details Name: [name] [name]. MRN: [id number] DOB: 1951-07-06   Cancelled Treatment:    Reason Eval/Treat Not Completed: Other (comment)(not yet ready for OT eval, severe pain with touch)  OT to evaluate next available treatment time.  Ebony Hail Harold Hedge) Marsa Aris OTR/L Acute Rehabilitation Services Pager: [phone] Office: [place] 08/29/2019, 9:53 AM

## 2019-08-29 NOTE — Progress Notes (Signed)
PT Cancellation Note  Patient Details Name: [name] [name]. MRN: [id number] DOB: [birth year]   Cancelled Treatment:    Reason Eval/Treat Not Completed: Patient not medically ready. Note severe pain with touch/movement, and multiple medical issues to assess.  Will follow up next date and proceed with mobility eval once pt is more stabile.    Kearney Hard [facility] 08/29/2019, 9:39 AM

## 2019-08-29 NOTE — Progress Notes (Signed)
NIF >-40 VC 2.7 Excellent patient effort

## 2019-08-29 NOTE — Progress Notes (Signed)
  Echocardiogram 2D Echocardiogram has been performed.  [name] [name] 08/29/2019, 10:33 AM

## 2019-08-29 NOTE — Progress Notes (Addendum)
Subjective: Patient lying completely flat in bed. NAD. Patient c/o pain. States robaxin did not help his pain. Patient is drowsy and speaking slowly. Sedated affect.   Objective: Current vital signs: BP 120/68   Pulse 83   Temp 97.7 F (36.5 C) (Oral)   Resp 18   Wt 118 kg   SpO2 92%   BMI 34.32 kg/m  Vital signs in last 24 hours: Temp:  [97.6 F (36.4 C)-99.8 F (37.7 C)] 97.7 F (36.5 C) (08/30 0816) Pulse Rate:  [81-116] 83 (08/30 0816) Resp:  [10-25] 18 (08/30 0816) BP: (105-128)/(62-91) 120/68 (08/30 0816) SpO2:  [90 %-95 %] 92 % (08/30 0816) Weight:  [295 kg] 118 kg (08/30 0500)  Intake/Output from previous day: 08/29 0701 - 08/30 0700 In: 705.4 [I.V.:505.4; IV Piggyback:200] Out: 1884 [Urine:1450] Intake/Output this shift: No intake/output data recorded. Nutritional status:  Diet Order            Diet Carb Modified Fluid consistency: Thin; Room service appropriate? Yes  Diet effective now             HEENT: [place]/AT Lungs: Respirations unlabored Ext: No edema  Neurologic Exam:  Mental Status: patient is drowsy and slurring his speech. Per wife the slurring had improved. Patient tends to speak slowly at baseline.  Cranial Nerves: EOMI. Face symmetric.   Motor: Bilateral lower extremities have 5/5 strength. 5/5 plantar and dorsiflexion bilaterally. BUE grip strength is 5/5 bilaterally.   The patient states the weakness is due to severe pain with voluntary muscle contraction.  Sensory: light touch intact in BUE, decreased sensation BLE Deep Tendon Reflexes: No definite interval change.  Cerebellar: Did not test secondary to pain Gait: deferred  Lab Results: Results for orders placed or performed during the hospital encounter of 08/27/19 (from the past 48 hour(s))  Comprehensive metabolic panel     Status: Abnormal   Collection Time: 08/27/19  9:08 AM  Result Value Ref Range   Sodium 135 135 - 145 mmol/L   Potassium 4.3 3.5 - 5.1 mmol/L   Chloride 99 98 -  111 mmol/L   CO2 21 (L) 22 - 32 mmol/L   Glucose, Bld 173 (H) 70 - 99 mg/dL   BUN 36 (H) 8 - 23 mg/dL   Creatinine, Ser 0.97 0.61 - 1.24 mg/dL   Calcium 10.1 8.9 - 10.3 mg/dL   Total Protein 7.5 6.5 - 8.1 g/dL   Albumin 3.5 3.5 - 5.0 g/dL   AST 20 15 - 41 U/L   ALT 48 (H) 0 - 44 U/L   Alkaline Phosphatase 103 38 - 126 U/L   Total Bilirubin 1.7 (H) 0.3 - 1.2 mg/dL   GFR calc non Af Amer >60 >60 mL/min   GFR calc Af Amer >60 >60 mL/min   Anion gap 15 5 - 15    Comment: Performed at [facility], [address], [place], [place] [address]  CBC with Differential     Status: Abnormal   Collection Time: 08/27/19  9:08 AM  Result Value Ref Range   WBC 12.0 (H) 4.0 - 10.5 K/uL   RBC 6.29 (H) 4.22 - 5.81 MIL/uL   Hemoglobin 19.4 (H) 13.0 - 17.0 g/dL   HCT 56.4 (H) 39.0 - 52.0 %   MCV 89.7 80.0 - 100.0 fL   MCH 30.8 26.0 - 34.0 pg   MCHC 34.4 30.0 - 36.0 g/dL   RDW 13.4 11.5 - 15.5 %   Platelets 103 (L) 150 - 400  K/uL    Comment: REPEATED TO VERIFY PLATELET COUNT CONFIRMED BY SMEAR Immature Platelet Fraction may be clinically indicated, consider ordering this additional test [id number]    nRBC 0.0 0.0 - 0.2 %   Neutrophils Relative % 81 %   Neutro Abs 9.8 (H) 1.7 - 7.7 K/uL   Lymphocytes Relative 7 %   Lymphs Abs 0.8 0.7 - 4.0 K/uL   Monocytes Relative 10 %   Monocytes Absolute 1.2 (H) 0.1 - 1.0 K/uL   Eosinophils Relative 1 %   Eosinophils Absolute 0.1 0.0 - 0.5 K/uL   Basophils Relative 0 %   Basophils Absolute 0.0 0.0 - 0.1 K/uL   Immature Granulocytes 1 %   Abs Immature Granulocytes 0.06 0.00 - 0.07 K/uL    Comment: Performed at [facility], [address], [place], [place] [address]  Sedimentation rate     Status: None   Collection Time: 08/27/19  9:08 AM  Result Value Ref Range   Sed Rate 8 0 - 16 mm/hr    Comment: Performed at [facility] [address] [place], [place] [address]  C-reactive protein     Status: Abnormal   Collection Time:  08/27/19  9:08 AM  Result Value Ref Range   CRP 1.3 (H) <1.0 mg/dL    Comment: Performed at [facility] [address], [place], [place] [address]  SARS CORONAVIRUS 2 (TAT 6-12 HRS) Nasal Swab Aptima Multi Swab     Status: None   Collection Time: 08/27/19  9:09 AM   Specimen: Aptima Multi Swab; Nasal Swab  Result Value Ref Range   SARS Coronavirus 2 NEGATIVE NEGATIVE    Comment: (NOTE) SARS-CoV-2 target nucleic acids are NOT DETECTED. The SARS-CoV-2 RNA is generally detectable in upper and lower respiratory specimens during the acute phase of infection. Negative results do not preclude SARS-CoV-2 infection, do not rule out co-infections with other pathogens, and should not be used as the sole basis for treatment or other patient management decisions. Negative results must be combined with clinical observations, patient history, and epidemiological information. The expected result is Negative. Fact Sheet for Patients: SugarRoll.be Fact Sheet for Healthcare Providers: https://www.woods-mathews.com/ This test is not yet approved or cleared by the [place] FDA and  has been authorized for detection and/or diagnosis of SARS-CoV-2 by FDA under an Emergency Use Authorization (EUA). This EUA will remain  in effect (meaning this test can be used) for the duration of the COVID-19 declaration under Section 56 4(b)(1) of the Act, 21 U.S.C. section 360bbb-3(b)(1), unless the authorization is terminated or revoked sooner. Performed at [facility], [place] [address], [place], [place] [address]   Culture, blood (routine x 2)     Status: Abnormal (Preliminary result)   Collection Time: 08/27/19  9:51 AM   Specimen: BLOOD RIGHT ARM  Result Value Ref Range   Specimen Description BLOOD RIGHT ARM    Special Requests      BOTTLES DRAWN AEROBIC AND ANAEROBIC Blood Culture adequate volume   Culture  Setup Time      GRAM POSITIVE COCCI IN  CLUSTERS IN BOTH AEROBIC AND ANAEROBIC BOTTLES CRITICAL RESULT CALLED TO, READ BACK BY AND VERIFIED WITH: G. ABBOTT,PHARMD 0217 08/28/2019 Mena Goes Performed at [facility], [place] [address], [place], [place] [address]    Culture STAPHYLOCOCCUS AUREUS (A)    Report Status PENDING   Blood Culture ID Panel (Reflexed)     Status: Abnormal  Collection Time: 08/27/19  9:51 AM  Result Value Ref Range   Enterococcus species NOT DETECTED NOT DETECTED   Listeria monocytogenes NOT DETECTED NOT DETECTED   Staphylococcus species DETECTED (A) NOT DETECTED    Comment: CRITICAL RESULT CALLED TO, READ BACK BY AND VERIFIED WITH: G. ABBOTT,PHARMD 0217 08/28/2019 T. TYSOR    Staphylococcus aureus (BCID) DETECTED (A) NOT DETECTED    Comment: Methicillin (oxacillin) susceptible Staphylococcus aureus (MSSA). Preferred therapy is anti staphylococcal beta lactam antibiotic (Cefazolin or Nafcillin), unless clinically contraindicated. CRITICAL RESULT CALLED TO, READ BACK BY AND VERIFIED WITH: G. ABBOTT,PHARMD 0217 08/28/2019 T. TYSOR    Methicillin resistance NOT DETECTED NOT DETECTED   Streptococcus species NOT DETECTED NOT DETECTED   Streptococcus agalactiae NOT DETECTED NOT DETECTED   Streptococcus pneumoniae NOT DETECTED NOT DETECTED   Streptococcus pyogenes NOT DETECTED NOT DETECTED   Acinetobacter baumannii NOT DETECTED NOT DETECTED   Enterobacteriaceae species NOT DETECTED NOT DETECTED   Enterobacter cloacae complex NOT DETECTED NOT DETECTED   Escherichia coli NOT DETECTED NOT DETECTED   Klebsiella oxytoca NOT DETECTED NOT DETECTED   Klebsiella pneumoniae NOT DETECTED NOT DETECTED   Proteus species NOT DETECTED NOT DETECTED   Serratia marcescens NOT DETECTED NOT DETECTED   Haemophilus influenzae NOT DETECTED NOT DETECTED   Neisseria meningitidis NOT DETECTED NOT DETECTED   Pseudomonas aeruginosa NOT DETECTED NOT DETECTED   Candida albicans NOT DETECTED NOT DETECTED   Candida glabrata NOT  DETECTED NOT DETECTED   Candida krusei NOT DETECTED NOT DETECTED   Candida parapsilosis NOT DETECTED NOT DETECTED   Candida tropicalis NOT DETECTED NOT DETECTED    Comment: Performed at [facility], [address], [place], [place] [address]  HIV antibody (Routine Testing)     Status: None   Collection Time: 08/27/19  3:31 PM  Result Value Ref Range   HIV Screen 4th Generation wRfx Non Reactive Non Reactive    Comment: (NOTE) Performed At: [facility] [place], [place] [id number] Rush Farmer MD MG:[id number]   Protime-INR     Status: None   Collection Time: 08/27/19  3:31 PM  Result Value Ref Range   Prothrombin Time 14.4 11.4 - 15.2 seconds   INR 1.1 0.8 - 1.2    Comment: (NOTE) INR goal varies based on device and disease states. Performed at [facility], [place] [address] [place], [place] [address]   TSH     Status: None   Collection Time: 08/27/19  5:05 PM  Result Value Ref Range   TSH 1.414 0.350 - 4.500 uIU/mL    Comment: Performed by a 3rd Generation assay with a functional sensitivity of <=0.01 uIU/mL. Performed at [facility], [place] [address] [place], [place] [address]   CK     Status: Abnormal   Collection Time: 08/27/19  5:05 PM  Result Value Ref Range   Total CK 30 (L) 49 - 397 U/L    Comment: Performed at [facility], [place] [address], [place], [place] [address]  Myoglobin, serum     Status: None   Collection Time: 08/27/19  5:05 PM  Result Value Ref Range   Myoglobin 28 28 - 72 ng/mL    Comment: (NOTE) Performed At: [facility] [place], [place] [id number] Rush Farmer MD [id number]   Glucose, capillary     Status: Abnormal   Collection Time: 08/27/19  5:07 PM  Result Value Ref Range   Glucose-Capillary 263 (H)  70 - 99 mg/dL  Glucose, capillary     Status: Abnormal   Collection Time: 08/27/19  9:32 PM  Result Value Ref Range   Glucose-Capillary 184 (H) 70 - 99 mg/dL  Basic  metabolic panel     Status: Abnormal   Collection Time: 08/28/19  4:15 AM  Result Value Ref Range   Sodium 131 (L) 135 - 145 mmol/L   Potassium 4.4 3.5 - 5.1 mmol/L   Chloride 97 (L) 98 - 111 mmol/L   CO2 21 (L) 22 - 32 mmol/L   Glucose, Bld 230 (H) 70 - 99 mg/dL   BUN 37 (H) 8 - 23 mg/dL   Creatinine, Ser 1.48 (H) 0.61 - 1.24 mg/dL   Calcium 9.3 8.9 - 10.3 mg/dL   GFR calc non Af Amer 48 (L) >60 mL/min   GFR calc Af Amer 55 (L) >60 mL/min   Anion gap 13 5 - 15    Comment: Performed at [facility] [address], [place], [place] [address]  CBC     Status: Abnormal   Collection Time: 08/28/19  4:15 AM  Result Value Ref Range   WBC 15.8 (H) 4.0 - 10.5 K/uL   RBC 5.72 4.22 - 5.81 MIL/uL   Hemoglobin 17.9 (H) 13.0 - 17.0 g/dL   HCT 51.2 39.0 - 52.0 %   MCV 89.5 80.0 - 100.0 fL   MCH 31.3 26.0 - 34.0 pg   MCHC 35.0 30.0 - 36.0 g/dL   RDW 13.7 11.5 - 15.5 %   Platelets 91 (L) 150 - 400 K/uL    Comment: REPEATED TO VERIFY Immature Platelet Fraction may be clinically indicated, consider ordering this additional test [id number] CONSISTENT WITH PREVIOUS RESULT    nRBC 0.0 0.0 - 0.2 %    Comment: Performed at [facility], [place] [address], [place], [place] [address]  Hepatic function panel     Status: Abnormal   Collection Time: 08/28/19  4:15 AM  Result Value Ref Range   Total Protein 6.1 (L) 6.5 - 8.1 g/dL   Albumin 2.9 (L) 3.5 - 5.0 g/dL   AST 22 15 - 41 U/L   ALT 40 0 - 44 U/L   Alkaline Phosphatase 112 38 - 126 U/L   Total Bilirubin 1.8 (H) 0.3 - 1.2 mg/dL   Bilirubin, Direct 0.8 (H) 0.0 - 0.2 mg/dL   Indirect Bilirubin 1.0 (H) 0.3 - 0.9 mg/dL    Comment: Performed at [facility] [address], [place], [place] [address]  MRSA PCR Screening     Status: None   Collection Time: 08/28/19  7:54 AM   Specimen: Nasal Mucosa; Nasopharyngeal  Result Value Ref Range   MRSA by PCR NEGATIVE NEGATIVE    Comment:        The GeneXpert MRSA Assay (FDA approved  for NASAL specimens only), is one component of a comprehensive MRSA colonization surveillance program. It is not intended to diagnose MRSA infection nor to guide or monitor treatment for MRSA infections. Performed at [facility], [place] [address] [place], [place] [address]   Glucose, capillary     Status: Abnormal   Collection Time: 08/28/19  8:25 AM  Result Value Ref Range   Glucose-Capillary 219 (H) 70 - 99 mg/dL  Glucose, capillary     Status: Abnormal   Collection Time: 08/28/19 11:36 AM  Result Value Ref Range   Glucose-Capillary 198 (H) 70 - 99 mg/dL  Glucose, capillary  Status: Abnormal   Collection Time: 08/28/19  4:44 PM  Result Value Ref Range   Glucose-Capillary 183 (H) 70 - 99 mg/dL  Glucose, capillary     Status: Abnormal   Collection Time: 08/28/19  8:57 PM  Result Value Ref Range   Glucose-Capillary 159 (H) 70 - 99 mg/dL   Comment 1 Notify RN    Comment 2 Document in Chart   Magnesium     Status: None   Collection Time: 08/29/19  5:48 AM  Result Value Ref Range   Magnesium 2.0 1.7 - 2.4 mg/dL    Comment: Performed at [facility], [place] [address] [place], [place] [address]  CBC with Differential/Platelet     Status: Abnormal   Collection Time: 08/29/19  5:48 AM  Result Value Ref Range   WBC 12.9 (H) 4.0 - 10.5 K/uL   RBC 5.18 4.22 - 5.81 MIL/uL   Hemoglobin 16.0 13.0 - 17.0 g/dL   HCT 46.7 39.0 - 52.0 %   MCV 90.2 80.0 - 100.0 fL   MCH 30.9 26.0 - 34.0 pg   MCHC 34.3 30.0 - 36.0 g/dL   RDW 13.8 11.5 - 15.5 %   Platelets 73 (L) 150 - 400 K/uL    Comment: REPEATED TO VERIFY Immature Platelet Fraction may be clinically indicated, consider ordering this additional test [id number] CONSISTENT WITH PREVIOUS RESULT    nRBC 0.0 0.0 - 0.2 %   Neutrophils Relative % 74 %   Neutro Abs 9.6 (H) 1.7 - 7.7 K/uL   Lymphocytes Relative 12 %   Lymphs Abs 1.5 0.7 - 4.0 K/uL   Monocytes Relative 12 %   Monocytes Absolute 1.5 (H) 0.1 - 1.0 K/uL    Eosinophils Relative 1 %   Eosinophils Absolute 0.1 0.0 - 0.5 K/uL   Basophils Relative 0 %   Basophils Absolute 0.1 0.0 - 0.1 K/uL   WBC Morphology VACUOLATED NEUTROPHILS    Immature Granulocytes 1 %   Abs Immature Granulocytes 0.16 (H) 0.00 - 0.07 K/uL    Comment: Performed at [facility], [place] [address], [place], [place] [address]  Comprehensive metabolic panel     Status: Abnormal   Collection Time: 08/29/19  5:48 AM  Result Value Ref Range   Sodium 134 (L) 135 - 145 mmol/L   Potassium 4.0 3.5 - 5.1 mmol/L   Chloride 100 98 - 111 mmol/L   CO2 24 22 - 32 mmol/L   Glucose, Bld 158 (H) 70 - 99 mg/dL   BUN 30 (H) 8 - 23 mg/dL   Creatinine, Ser 0.97 0.61 - 1.24 mg/dL   Calcium 9.2 8.9 - 10.3 mg/dL   Total Protein 5.4 (L) 6.5 - 8.1 g/dL   Albumin 2.4 (L) 3.5 - 5.0 g/dL   AST 24 15 - 41 U/L   ALT 40 0 - 44 U/L   Alkaline Phosphatase 92 38 - 126 U/L   Total Bilirubin 1.1 0.3 - 1.2 mg/dL   GFR calc non Af Amer >60 >60 mL/min   GFR calc Af Amer >60 >60 mL/min   Anion gap 10 5 - 15    Comment: Performed at [facility], [place] [address] [place], [place] [address]  Glucose, capillary     Status: Abnormal   Collection Time: 08/29/19  8:07 AM  Result Value Ref Range   Glucose-Capillary 153 (H) 70 - 99 mg/dL    Recent Results (from the past 240 hour(s))  Culture, blood (routine  x 2)     Status: Abnormal (Preliminary result)   Collection Time: 08/27/19  8:54 AM   Specimen: BLOOD  Result Value Ref Range Status   Specimen Description BLOOD RIGHT ANTECUBITAL  Final   Special Requests   Final    BOTTLES DRAWN AEROBIC ONLY Blood Culture adequate volume   Culture  Setup Time   Final    GRAM POSITIVE COCCI IN CLUSTERS AEROBIC BOTTLE ONLY CRITICAL RESULT CALLED TO, READ BACK BY AND VERIFIED WITH: G. ABBOTT,PHARMD 0217 08/28/2019 Mena Goes Performed at [facility], [address], [place], [place] [address]    Culture STAPHYLOCOCCUS AUREUS (A)  Final   Report Status  PENDING  Incomplete  SARS CORONAVIRUS 2 (TAT 6-12 HRS) Nasal Swab Aptima Multi Swab     Status: None   Collection Time: 08/27/19  9:09 AM   Specimen: Aptima Multi Swab; Nasal Swab  Result Value Ref Range Status   SARS Coronavirus 2 NEGATIVE NEGATIVE Final    Comment: (NOTE) SARS-CoV-2 target nucleic acids are NOT DETECTED. The SARS-CoV-2 RNA is generally detectable in upper and lower respiratory specimens during the acute phase of infection. Negative results do not preclude SARS-CoV-2 infection, do not rule out co-infections with other pathogens, and should not be used as the sole basis for treatment or other patient management decisions. Negative results must be combined with clinical observations, patient history, and epidemiological information. The expected result is Negative. Fact Sheet for Patients: SugarRoll.be Fact Sheet for Healthcare Providers: https://www.woods-mathews.com/ This test is not yet approved or cleared by the [place] FDA and  has been authorized for detection and/or diagnosis of SARS-CoV-2 by FDA under an Emergency Use Authorization (EUA). This EUA will remain  in effect (meaning this test can be used) for the duration of the COVID-19 declaration under Section 56 4(b)(1) of the Act, 21 U.S.C. section 360bbb-3(b)(1), unless the authorization is terminated or revoked sooner. Performed at [facility], [place] [address], [place], [place] [address]   Culture, blood (routine x 2)     Status: Abnormal (Preliminary result)   Collection Time: 08/27/19  9:51 AM   Specimen: BLOOD RIGHT ARM  Result Value Ref Range Status   Specimen Description BLOOD RIGHT ARM  Final   Special Requests   Final    BOTTLES DRAWN AEROBIC AND ANAEROBIC Blood Culture adequate volume   Culture  Setup Time   Final    GRAM POSITIVE COCCI IN CLUSTERS IN BOTH AEROBIC AND ANAEROBIC BOTTLES CRITICAL RESULT CALLED TO, READ BACK BY AND VERIFIED  WITH: G. ABBOTT,PHARMD 0217 08/28/2019 Mena Goes Performed at [facility], [place] [address] [place], [place] [address]    Culture STAPHYLOCOCCUS AUREUS (A)  Final   Report Status PENDING  Incomplete  Blood Culture ID Panel (Reflexed)     Status: Abnormal   Collection Time: 08/27/19  9:51 AM  Result Value Ref Range Status   Enterococcus species NOT DETECTED NOT DETECTED Final   Listeria monocytogenes NOT DETECTED NOT DETECTED Final   Staphylococcus species DETECTED (A) NOT DETECTED Final    Comment: CRITICAL RESULT CALLED TO, READ BACK BY AND VERIFIED WITH: G. ABBOTT,PHARMD 0217 08/28/2019 T. TYSOR    Staphylococcus aureus (BCID) DETECTED (A) NOT DETECTED Final    Comment: Methicillin (oxacillin) susceptible Staphylococcus aureus (MSSA). Preferred therapy is anti staphylococcal beta lactam antibiotic (Cefazolin or Nafcillin), unless clinically contraindicated. CRITICAL RESULT CALLED TO, READ BACK BY AND VERIFIED WITH: G. ABBOTT,PHARMD 0217 08/28/2019 T. TYSOR  Methicillin resistance NOT DETECTED NOT DETECTED Final   Streptococcus species NOT DETECTED NOT DETECTED Final   Streptococcus agalactiae NOT DETECTED NOT DETECTED Final   Streptococcus pneumoniae NOT DETECTED NOT DETECTED Final   Streptococcus pyogenes NOT DETECTED NOT DETECTED Final   Acinetobacter baumannii NOT DETECTED NOT DETECTED Final   Enterobacteriaceae species NOT DETECTED NOT DETECTED Final   Enterobacter cloacae complex NOT DETECTED NOT DETECTED Final   Escherichia coli NOT DETECTED NOT DETECTED Final   Klebsiella oxytoca NOT DETECTED NOT DETECTED Final   Klebsiella pneumoniae NOT DETECTED NOT DETECTED Final   Proteus species NOT DETECTED NOT DETECTED Final   Serratia marcescens NOT DETECTED NOT DETECTED Final   Haemophilus influenzae NOT DETECTED NOT DETECTED Final   Neisseria meningitidis NOT DETECTED NOT DETECTED Final   Pseudomonas aeruginosa NOT DETECTED NOT DETECTED Final   Candida albicans NOT  DETECTED NOT DETECTED Final   Candida glabrata NOT DETECTED NOT DETECTED Final   Candida krusei NOT DETECTED NOT DETECTED Final   Candida parapsilosis NOT DETECTED NOT DETECTED Final   Candida tropicalis NOT DETECTED NOT DETECTED Final    Comment: Performed at [facility], [place] [address], [place], [place] [address]  MRSA PCR Screening     Status: None   Collection Time: 08/28/19  7:54 AM   Specimen: Nasal Mucosa; Nasopharyngeal  Result Value Ref Range Status   MRSA by PCR NEGATIVE NEGATIVE Final    Comment:        The GeneXpert MRSA Assay (FDA approved for NASAL specimens only), is one component of a comprehensive MRSA colonization surveillance program. It is not intended to diagnose MRSA infection nor to guide or monitor treatment for MRSA infections. Performed at [facility], [place] [address] [place], [place] [address]     Lipid Panel No results for input(s): CHOL, TRIG, HDL, CHOLHDL, VLDL, LDLCALC in the last 72 hours.  Studies/Results: Mr Jeri Cos WE Contrast  Result Date: 08/28/2019 CLINICAL DATA:  Worsening low back pain and weakness. EXAM: MRI HEAD WITHOUT AND WITH CONTRAST TECHNIQUE: Multiplanar, multiecho pulse sequences of the brain and surrounding structures were obtained without and with intravenous contrast. CONTRAST:  10 cc Gadavist COMPARISON:  06/05/2018 FINDINGS: Brain: Diffusion imaging shows a few punctate foci of restricted diffusion, 2 in the right thalamus in 1 in the splenium of the corpus callosum to the left of midline. These could be due to micro embolic infarctions in the posterior circulation. The brainstem is normal. Few old small vessel cerebellar infarctions. Cerebral hemispheres show mild chronic small-vessel ischemic change of the deep white matter. No mass lesion, hemorrhage, hydrocephalus or extra-axial collection. After contrast administration, no abnormal enhancement occurs. Vascular: Major vessels at the base of the brain show flow.  Skull and upper cervical spine: Negative Sinuses/Orbits: Clear/normal Other: Question presence of a posterior nasopharyngeal mass on the right. Suggest ENT consultation. This could be a benign polyp or a malignancy. 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 These could be due to micro embolic infarctions in the posterior circulation. Mild chronic small-vessel ischemic changes of the brain elsewhere as seen previously. Probable posterior nasopharyngeal polyp or mass lesion on the right. ENT referral suggested at some point. Electronically Signed   By: Nelson Chimes M.D.   On: 08/28/2019 07:00   Mr Cervical Spine W Wo Contrast  Result Date: 08/28/2019 CLINICAL DATA:  Weakness.  Myelopathy. EXAM: MRI CERVICAL SPINE WITHOUT AND WITH  CONTRAST TECHNIQUE: Multiplanar and multiecho pulse sequences of the cervical spine, to include the craniocervical junction and cervicothoracic junction, were obtained without and with intravenous contrast. CONTRAST:  10 cc MultiHance COMPARISON:  None. FINDINGS: Alignment: Mild straightening of the normal cervical lordosis. Vertebrae: No fracture or primary bone lesion. Cord: No cord compression or primary cord lesion. Posterior Fossa, vertebral arteries, paraspinal tissues: See results of brain MRI. Disc levels: No significant finding at the foramen magnum, C1-2 or C2-3. C3-4: Spondylosis without compressive central canal stenosis. Foraminal narrowing left worse than right. This could affect the left C4 nerve. C4-5: Spondylosis with canal narrowing and effacement of the subarachnoid space surrounding the cord. AP diameter of the canal 10 mm. No cord compression. Bilateral foraminal narrowing that could affect either C5 nerve. C5-6: Spondylosis with narrowing the canal but no cord compression. AP diameter 1 cm. Bilateral foraminal narrowing that could affect either C6 nerve. C6-7: Spondylosis with narrowing of the  ventral subarachnoid space. AP diameter of the canal 11 mm. Bilateral foraminal narrowing that could affect either C7 nerve. C7-T1: Facet osteoarthritis on the left. No canal stenosis. Mild left foraminal stenosis. IMPRESSION: No primary cord lesion or cord compression. Ordinary cervical spondylosis. No compressive central canal stenosis. On the left at C3-4, bilateral at C4-5, bilateral at C5-6, bilateral at C6-7 and on the left at C7-T1. Electronically Signed   By: Nelson Chimes M.D.   On: 08/28/2019 07:04   Mr Thoracic Spine W Wo Contrast  Addendum Date: 08/28/2019   ADDENDUM REPORT: 08/28/2019 13:29 ADDENDUM: Additional clinical history has been provided. The patient has MSSA bacteremia. 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 These results were discussed by telephone on 08/28/2019 at 1:00 pm to Dr. Aline August, who verbally acknowledged these results. Electronically Signed   By: Titus Dubin M.D.   On: 08/28/2019 13:29   Result Date: 08/28/2019 CLINICAL DATA:  Worsening back pain with recent abdominal aortic aneurysm diagnosis. Difficulty getting out of bed. EXAM: MRI THORACIC AND LUMBAR SPINE WITHOUT AND WITH CONTRAST TECHNIQUE: Multiplanar and multiecho pulse sequences of the thoracic and lumbar spine were obtained without and with intravenous contrast. CONTRAST:  10 mL Gadavist COMPARISON:  None. FINDINGS: MRI THORACIC SPINE FINDINGS Alignment:  Physiologic. Vertebrae: No fracture, evidence of discitis, or bone lesion. Cord:  Normal signal and morphology. Paraspinal and other soft tissues: Bilateral dependent atelectasis. Disc levels: T8-9: Small central disc protrusion without stenosis. T9-10: Small right subarticular disc protrusion without stenosis. T11-12: Left subarticular disc protrusion without stenosis. MRI LUMBAR SPINE FINDINGS Segmentation:  Standard. Alignment:  Physiologic.  Vertebrae: Hyperintense T2-weighted signal with contrast enhancement at L4-5, likely degenerative. Conus medullaris: Extends to the L2 level and appears normal. Paraspinal and other soft tissues: Negative Disc levels: L1-2: No disc herniation or stenosis. L2-3: Intermediate disc bulge with mild spinal canal stenosis. Mild right foraminal stenosis. L3-4: Right eccentric disc bulge with narrowing of the right lateral recess and mild right foraminal stenosis. L4-5: Mild disc edema with left-greater-than-right facet arthrosis. Small amount of fluid in the left facet. Left-greater-than-right lateral recess stenosis. Severe left and moderate right foraminal stenosis. L5-S1: Intermediate disc bulge with endplate spurring. Moderate bilateral neural foraminal stenosis. No central spinal canal stenosis. IMPRESSION: 1. No spinal cord or cauda equina abnormality. 2. Signal changes and contrast enhancement of the bone marrow at L4-5, favored to be degenerative. No height loss or endplate erosion. 3. Multilevel moderate-to-severe lumbar  neural foraminal stenosis. 4. 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 Electronically Signed: By: Ulyses Jarred M.D. On: 08/28/2019 06:50   Mr Lumbar Spine W Wo Contrast  Addendum Date: 08/28/2019   ADDENDUM REPORT: 08/28/2019 13:29 ADDENDUM: Additional clinical history has been provided. The patient has MSSA bacteremia. 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 These results were discussed by telephone on 08/28/2019 at 1:00 pm to Dr. Aline August, who verbally acknowledged these results. Electronically Signed   By: Titus Dubin M.D.   On: 08/28/2019 13:29   Result Date: 08/28/2019 CLINICAL DATA:  Worsening back pain with recent abdominal aortic aneurysm diagnosis. Difficulty getting out of bed. EXAM: MRI THORACIC AND LUMBAR SPINE WITHOUT AND WITH CONTRAST  TECHNIQUE: Multiplanar and multiecho pulse sequences of the thoracic and lumbar spine were obtained without and with intravenous contrast. CONTRAST:  10 mL Gadavist COMPARISON:  None. FINDINGS: MRI THORACIC SPINE FINDINGS Alignment:  Physiologic. Vertebrae: No fracture, evidence of discitis, or bone lesion. Cord:  Normal signal and morphology. Paraspinal and other soft tissues: Bilateral dependent atelectasis. Disc levels: T8-9: Small central disc protrusion without stenosis. T9-10: Small right subarticular disc protrusion without stenosis. T11-12: Left subarticular disc protrusion without stenosis. MRI LUMBAR SPINE FINDINGS Segmentation:  Standard. Alignment:  Physiologic. Vertebrae: Hyperintense T2-weighted signal with contrast enhancement at L4-5, likely degenerative. Conus medullaris: Extends to the L2 level and appears normal. Paraspinal and other soft tissues: Negative Disc levels: L1-2: No disc herniation or stenosis. L2-3: Intermediate disc bulge with mild spinal canal stenosis. Mild right foraminal stenosis. L3-4: Right eccentric disc bulge with narrowing of the right lateral recess and mild right foraminal stenosis. L4-5: Mild disc edema with left-greater-than-right facet arthrosis. Small amount of fluid in the left facet. Left-greater-than-right lateral recess stenosis. Severe left and moderate right foraminal stenosis. L5-S1: Intermediate disc bulge with endplate spurring. Moderate bilateral neural foraminal stenosis. No central spinal canal stenosis. IMPRESSION: 1. No spinal cord or cauda equina abnormality. 2. Signal changes and contrast enhancement of the bone marrow at L4-5, favored to be degenerative. No height loss or endplate erosion. 3. Multilevel moderate-to-severe lumbar neural foraminal stenosis. 4. 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 Electronically Signed: By: Ulyses Jarred M.D. On: 08/28/2019 06:50    Medications:  Scheduled: . acetaminophen  325 mg Oral Once  .  atorvastatin  80 mg Oral QHS  . brimonidine  1 drop Both Eyes TID  . docusate sodium  100 mg Oral BID  . dofetilide  250 mcg Oral BID  . insulin aspart  0-15 Units Subcutaneous TID WC  . insulin aspart  0-5 Units Subcutaneous QHS  . ipratropium-albuterol  3 mL Nebulization TID  . loratadine  10 mg Oral Daily  . pantoprazole  40 mg Oral Daily  . Suvorexant  1 tablet Oral QHS   Continuous: . sodium chloride 75 mL/hr at 08/29/19 0813  .  ceFAZolin (ANCEF) IV 2 g (08/29/19 4496)  . methocarbamol (ROBAXIN) IV 1,000 mg (08/29/19 0343)    Labs:  CRP: 1.3 ESR: WNL  MRI Brain: Punctate acute infarctions, 2 in the right thalamus and 1 in the splenium of the corpus callosum. These could be due to micro embolic infarctions in the posterior circulation. Mild chronic small-vessel ischemic changes of the brain elsewhere as seen previously.Probable posterior nasopharyngeal polyp or mass lesion on the right. ENT referral suggested at some point.  MRI cervical spine: No primary cord  lesion or cord compression. Ordinary cervical spondylosis. No compressive central canal stenosis. On the left at C3-4, bilateral at C4-5, bilateral at C5-6, bilateral at C6-7 and on the left at C7-T1.  MRI T-spine and L-spine:  1. No spinal cord or cauda equina abnormality. 2. Signal changes and contrast enhancement of the bone marrow at L4-5, favored to be degenerative. No height loss or endplate erosion. 3. Multilevel moderate-to-severe lumbar neural foraminal stenosis. 4. 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 Neurology review of images: The findings at L4-5 appear more consistent with a discitis/osteomyelitis, in the context of severe LBP exacerbated by movement, his elevated WBC, skin warm to touch yesterday suggestive of a mild fever, his decreased appetite x 2 weeks, malaise x 2 weeks and his staph bacteremia.   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 His second primary neurological complaint consists of severe muscle pain in upper and lower extremities as well as his neck which limit mobility and has resulted in being bedridden with decreased PO intake over the past 2 weeks. He has an elevated BUN:Cr ratio consistent with his stated decreased PO intake. He presented to the emergency department primarily for evaluation of new onset of loss of strength and pain in the upper extremities, lower extremities and lower back.   1. DDx for vision loss and weakness includes NMO and MS. DDx for muscle pain and stiffness includes stiff person syndrome (SPS), dermatomyositis and severe polymyalgia rheumatica (this could be associated with temporal arteritis, but he has no complaints of temporal pain). The DDx for his severe low back pain, which is worse than the muscle pain in his limbs, includes SPS, discitis/osteomyelitis and severe spinal stenosis. 2. 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 3. L-spine MRI reviewed: The findings at L4-5 appear more consistent with a discitis/osteomyelitis, in the context of severe LBP exacerbated by movement, his elevated WBC, skin warm to touch yesterday suggestive of a mild fever, his decreased appetite x 2 weeks, malaise x 2 weeks and his staph bacteremia. Per ID patient started on Ancef for possible discitis/osteomyelitis. 4. MRI cervical and thoracic spine shows degenerative changes without cord signal abnormality.  5. MRI brain reviewed. The DWI abnormalities are faint and may represent T2-shine through artifact rather than strokes; if they are  strokes, then most likely to be late subacute due to hypertensive small vessel disease, with the locations being atypical for microembolic strokes. To fully assess, will need TTE and CTA of head and neck.  6. CK was only 30. Myoglobin was 28, also normal. Most likely does not have a myositis.   Recommendations: - Anti-glutamic acid decarboxylase antibody titer (anti-GAD). This has been ordered. (still pending)  -- IV hydration --Trial of Robaxin 1000 mg IV TID for 6 doses has been ordered. Observe for possible improvement in LBP and diffuse limb and neck muscle pain.  -- TTE ( pending)  -- CTA of head and neck -- Cardiac telemetry -- Off Pradaxa in anticipation of LP under fluoro. Restart 24 hour after LP.   Electronically signed: Dr.  Kerney Elbe

## 2019-08-30 ENCOUNTER — Inpatient Hospital Stay (HOSPITAL_COMMUNITY): Payer: Medicare Other

## 2019-08-30 DIAGNOSIS — M25512 Pain in left shoulder: Secondary | ICD-10-CM

## 2019-08-30 DIAGNOSIS — M4646 Discitis, unspecified, lumbar region: Secondary | ICD-10-CM

## 2019-08-30 DIAGNOSIS — R7881 Bacteremia: Secondary | ICD-10-CM

## 2019-08-30 DIAGNOSIS — M25511 Pain in right shoulder: Secondary | ICD-10-CM

## 2019-08-30 DIAGNOSIS — R634 Abnormal weight loss: Secondary | ICD-10-CM

## 2019-08-30 DIAGNOSIS — B9561 Methicillin susceptible Staphylococcus aureus infection as the cause of diseases classified elsewhere: Secondary | ICD-10-CM

## 2019-08-30 DIAGNOSIS — I351 Nonrheumatic aortic (valve) insufficiency: Secondary | ICD-10-CM

## 2019-08-30 DIAGNOSIS — M009 Pyogenic arthritis, unspecified: Secondary | ICD-10-CM | POA: Insufficient documentation

## 2019-08-30 DIAGNOSIS — Z6834 Body mass index (BMI) 34.0-34.9, adult: Secondary | ICD-10-CM

## 2019-08-30 LAB — CBC WITH DIFFERENTIAL/PLATELET
Abs Immature Granulocytes: 0.08 10*3/uL — ABNORMAL HIGH (ref 0.00–0.07)
Basophils Absolute: 0 10*3/uL (ref 0.0–0.1)
Basophils Relative: 0 %
Eosinophils Absolute: 0.3 10*3/uL (ref 0.0–0.5)
Eosinophils Relative: 2 %
HCT: 45.5 % (ref 39.0–52.0)
Hemoglobin: 15.5 g/dL (ref 13.0–17.0)
Immature Granulocytes: 1 %
Lymphocytes Relative: 14 %
Lymphs Abs: 1.7 10*3/uL (ref 0.7–4.0)
MCH: 30.9 pg (ref 26.0–34.0)
MCHC: 34.1 g/dL (ref 30.0–36.0)
MCV: 90.6 fL (ref 80.0–100.0)
Monocytes Absolute: 1.7 10*3/uL — ABNORMAL HIGH (ref 0.1–1.0)
Monocytes Relative: 14 %
Neutro Abs: 8.6 10*3/uL — ABNORMAL HIGH (ref 1.7–7.7)
Neutrophils Relative %: 69 %
Platelets: 76 10*3/uL — ABNORMAL LOW (ref 150–400)
RBC: 5.02 MIL/uL (ref 4.22–5.81)
RDW: 13.8 % (ref 11.5–15.5)
WBC: 12.3 10*3/uL — ABNORMAL HIGH (ref 4.0–10.5)
nRBC: 0 % (ref 0.0–0.2)

## 2019-08-30 LAB — BASIC METABOLIC PANEL
Anion gap: 9 (ref 5–15)
BUN: 24 mg/dL — ABNORMAL HIGH (ref 8–23)
CO2: 23 mmol/L (ref 22–32)
Calcium: 9 mg/dL (ref 8.9–10.3)
Chloride: 100 mmol/L (ref 98–111)
Creatinine, Ser: 0.87 mg/dL (ref 0.61–1.24)
GFR calc Af Amer: >60 mL/min (ref >60)
GFR calc non Af Amer: >60 mL/min (ref >60)
Glucose, Bld: 141 mg/dL — ABNORMAL HIGH (ref 70–99)
Potassium: 4.3 mmol/L (ref 3.5–5.1)
Sodium: 132 mmol/L — ABNORMAL LOW (ref 135–145)

## 2019-08-30 LAB — GLUCOSE, CAPILLARY
Glucose-Capillary: 146 mg/dL — ABNORMAL HIGH (ref 70–99)
Glucose-Capillary: 148 mg/dL — ABNORMAL HIGH (ref 70–99)
Glucose-Capillary: 147 mg/dL — ABNORMAL HIGH (ref 70–99)
Glucose-Capillary: 129 mg/dL — ABNORMAL HIGH (ref 70–99)

## 2019-08-30 LAB — MAGNESIUM: Magnesium: 1.8 mg/dL (ref 1.7–2.4)

## 2019-08-30 LAB — NEUROMYELITIS OPTICA AUTOAB, IGG: NMO-IgG: <1.5 U/mL (ref 0.0–3.0)

## 2019-08-30 MED ORDER — MAGNESIUM SULFATE 2 GM/50ML IV SOLN
2.0000 g | Freq: Once | INTRAVENOUS | Status: AC
  Administered 2019-08-30: 2 g via INTRAVENOUS
  Filled 2019-08-30: qty 50

## 2019-08-30 MED ORDER — LORAZEPAM 2 MG/ML IJ SOLN
0.5000 mg | Freq: Once | INTRAMUSCULAR | Status: AC
  Administered 2019-08-30: 0.5 mg via INTRAVENOUS
  Filled 2019-08-30: qty 1

## 2019-08-30 MED ORDER — LIDOCAINE HCL (PF) 1 % IJ SOLN
5.0000 mL | Freq: Once | INTRAMUSCULAR | Status: DC
  Filled 2019-08-30 (×2): qty 5

## 2019-08-30 NOTE — Progress Notes (Signed)
Rehab Admissions Coordinator Note:  Per PT/OT recommendation, patient was screened by Michel Santee for appropriateness for an Inpatient Acute Rehab Consult.  Note pt only able to complete bed level therapy at this time.  Will follow for tolerance and progression before requesting a consult if appropriate.   Michel Santee 08/30/2019, 3:51 PM  I can be reached at [phone].

## 2019-08-30 NOTE — Progress Notes (Signed)
Patient has home unit CPAP at beside. RT set up patient home unit. NO O2 bleed in needed. Patient stated he is able to get his mask on without any assistance.

## 2019-08-30 NOTE — Evaluation (Signed)
Physical Therapy Evaluation Patient Details Name: [name] [name]. MRN: [id number] DOB: 1951-01-26 Today's Date: 08/30/2019   History of Present Illness  [name] [name]. is a 68 y.o. male with medical history significant of OSA on CPAP; perforated appy (2015); obesity; CAD s/p stents; HLD; DM; optic nerve neuritis; and afib on Pradaxa presenting with back pain.  Clinical Impression  Patient received in bed. Reports 8/10 pain lying still in bed. Patient agrees to PT evaluation. He required mod assist +2 to roll supine to sit, to his left was harder for him due to pain in left shoulder. Painful with all bed mobility. Mod assist +2 to perform sidelying to sit. Once seated patient was able to sit with min/min guard assist. Posterior leaning initially with sitting due to severe back pain. Patient will benefit from continued skilled PT acutely to improve functional independence with mobility.       Follow Up Recommendations CIR    Equipment Recommendations  Rolling walker with 5" wheels    Recommendations for Other Services       Precautions / Restrictions Precautions Precautions: Fall Precaution Comments: mod fall Restrictions Weight Bearing Restrictions: No      Mobility  Bed Mobility Overal bed mobility: Needs Assistance Bed Mobility: Rolling;Sidelying to Sit;Sit to Sidelying Rolling: Mod assist;+2 for physical assistance Sidelying to sit: Mod assist;+2 for physical assistance     Sit to sidelying: Min assist;+2 for physical assistance General bed mobility comments: Patient is limited by pain.  Transfers                 General transfer comment: unable to tolerate this visit  Ambulation/Gait             General Gait Details: unable  Stairs            Wheelchair Mobility    Modified Rankin (Stroke Patients Only)       Balance Overall balance assessment: Needs assistance Sitting-balance support: Feet supported;Bilateral upper extremity  supported Sitting balance-Leahy Scale: Poor Sitting balance - Comments: requires min/min guard assist in sitting due to pain in LB, unable to tolerate this position for very long Postural control: Posterior lean                                   Pertinent Vitals/Pain Pain Assessment: 0-10 Pain Score: 8  Pain Descriptors / Indicators: Aching;Grimacing;Guarding;Sore;Stabbing Pain Intervention(s): Limited activity within patient's tolerance;Monitored during session;Repositioned;Premedicated before session;RN gave pain meds during session    Home Living Family/patient expects to be discharged to:: Private residence Living Arrangements: Spouse/significant other Available Help at Discharge: Family;Available PRN/intermittently Type of Home: House Home Access: Stairs to enter   [employer] of Steps: 2-3 Home Layout: One level   Additional Comments: He may have some equipment at home, was not using prior to admission    Prior Function Level of Independence: Independent               Hand Dominance   Dominant Hand: Right    Extremity/Trunk Assessment   Upper Extremity Assessment Upper Extremity Assessment: Defer to OT evaluation    Lower Extremity Assessment Lower Extremity Assessment: Generalized weakness    Cervical / Trunk Assessment Cervical / Trunk Assessment: Normal  Communication   Communication: No difficulties  Cognition Arousal/Alertness: Awake/alert Behavior During Therapy: WFL for tasks assessed/performed Overall Cognitive Status: Within Functional Limits for tasks assessed  General Comments      Exercises     Assessment/Plan    PT Assessment Patient needs continued PT services  PT Problem List Decreased strength;Decreased mobility;Decreased range of motion;Decreased activity tolerance;Decreased balance;Pain       PT Treatment Interventions Gait training;Therapeutic  activities;Therapeutic exercise;Functional mobility training;Balance training;Patient/family education    PT Goals (Current goals can be found in the Care Plan section)  Acute Rehab PT Goals Patient Stated Goal: to decrease pain PT Goal Formulation: With patient Time For Goal Achievement: 09/13/19 Potential to Achieve Goals: Good    Frequency Min 3X/week   Barriers to discharge Inaccessible home environment;Decreased caregiver support steps to enter home, wife is disabled, son just had a baby    Co-evaluation PT/OT/SLP Co-Evaluation/Treatment: Yes Reason for Co-Treatment: For patient/therapist safety;To address functional/ADL transfers PT goals addressed during session: Mobility/safety with mobility;Balance         AM-PAC PT "6 Clicks" Mobility  Outcome Measure Help needed turning from your back to your side while in a flat bed without using bedrails?: A Lot Help needed moving from lying on your back to sitting on the side of a flat bed without using bedrails?: A Lot Help needed moving to and from a bed to a chair (including a wheelchair)?: Total Help needed standing up from a chair using your arms (e.g., wheelchair or bedside chair)?: Total Help needed to walk in hospital room?: Total Help needed climbing 3-5 steps with a railing? : Total 6 Click Score: 8    End of Session Equipment Utilized During Treatment: Oxygen Activity Tolerance: Patient limited by pain Patient left: in bed;with call bell/phone within reach Nurse Communication: Mobility status PT Visit Diagnosis: Muscle weakness (generalized) (M62.81);Other abnormalities of gait and mobility (R26.89);Pain;Difficulty in walking, not elsewhere classified (R26.2) Pain - Right/Left: (Low back, B shoulders) Pain - part of body: Shoulder    Time: 0935-1000 PT Time Calculation (min) (ACUTE ONLY): 25 min   Charges:   PT Evaluation $PT Eval Moderate Complexity: 1 Mod PT Treatments $Therapeutic Activity: 8-22 mins        Javier Gell, PT, GCS 08/30/19,10:34 AM

## 2019-08-30 NOTE — Progress Notes (Signed)
Patient performed a NIF of >-40 and a VC of 2.5L with good effort.

## 2019-08-30 NOTE — Care Management Important Message (Signed)
Important Message  Patient Details  Name: [name] [name]. MRN: [id number] Date of Birth: 27-Jun-1951   Medicare Important Message Given:  Yes     Alyssa Mancera 08/30/2019, 1:33 PM

## 2019-08-30 NOTE — Progress Notes (Signed)
Patient performed a NIF -40  and a VC of 2.1L with good effort.

## 2019-08-30 NOTE — Progress Notes (Signed)
Patient ID: [name] Schorsch., male   DOB: [birth year], 68 y.o.   MRN: [id number]  PROGRESS NOTE    [name] Chimes.  [id number] DOB: Aug 29, 1951 DOA: 08/27/2019 PCP: Burnis Medin, MD   Brief Narrative:  68 year old male with history of OSA on CPAP, perforated appendicitis in 2015, CAD status post stents, hyperlipidemia, diabetes mellitus type 2, optic neuritis with recent hospitalization at [facility] treated with intravenous and subsequent oral steroids, recent right lower extremity cellulitis treated as an outpatient with oral doxycycline, A. fib on Pradaxa presented with back pain and progressively worsening difficulty walking.  Neurology was consulted.  He was found to have MSSA bacteremia with question of L4-L5 discitis/osteomyelitis.  He was started on Ancef.  ID was consulted.  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  Spoke to Dr. Lindzen/neurology on 08/28/2019: He thinks that patient might have L4-L5 discitis/osteomyelitis which would explain the bacteremia and fever. -MRI of the cervical and thoracic spine showed degenerative changes without cord signal abnormality -MRI of the brain showed question of punctate acute infarctions.  Dr. Cheral Marker is not convinced about this for now.  CTA of the head and neck showed no evidence of CNS vasculitis  echo pending. -Follow further neurology recommendations.  PT/OT eval. -Trial of Robaxin as per neurology. -LP pending for today  MSSA bacteremia Question of L4-L5 discitis/osteomyelitis -Patient was recently treated for cellulitis with doxycycline as an outpatient.  No current evidence of cellulitis.  Bacteremia might be secondary to discitis/osteomyelitis.  Continue Ancef.  ID evaluation appreciated.  Follow repeat blood cultures.  Echo showed no valvular vegetations.   Spoke to neurosurgery/Dr. Annette Stable on phone on 08/28/2019 and he reviewed the MRI lumbar spine and recommended no surgical intervention at this time. -COVID-19 testing negative  Leukocytosis -Improving.  Monitor  Thrombocytopenia -Questionable cause.  Monitor.  No signs of bleeding  Dehydration Acute kidney injury -Creatinine improving.  IV fluids have been discontinued.  Paroxysmal A. fib on Pradaxa -Continue Tikosyn.  Magnesium 1.8 today. -Hold Pradaxa in anticipation of need for LP  Diabetes mellitus type 2 -A1c 6.7 on 07/21/2019 -Glucophage held -Continue CBGs with SSI  Hyperlipidemia -Continue Lipitor  OSA -Continue CPAP at bedside  Obesity -Outpatient follow-up  DVT prophylaxis: SCDs Code Status: Full Family Communication: spoke to wife/[name] [name] on phone on 08/30/2019  disposition Plan: Depends on clinical outcome  Consultants: Neurology/ID/IR.  Spoke to neurosurgery/Dr. Annette Stable on 08/28/2019.  Procedures:  Echo IMPRESSIONS    1. The left ventricle has normal systolic function with an ejection fraction of 60-65%. The cavity size was normal. There is mildly increased left ventricular wall thickness. Left ventricular diastolic Doppler parameters are consistent with impaired  relaxation. Indeterminate filling pressures The E/e' is 8-15. No evidence of left ventricular regional wall motion abnormalities.  2. The right ventricle has normal systolic function. The cavity was normal. There is no increase in right ventricular wall thickness.  3. Left atrial size was mildly dilated.  4. The mitral valve is abnormal. Mild thickening of the mitral valve leaflet. No mitral valve vegetation visualized.  5. The tricuspid valve is grossly normal.  6. No vegetation on the aortic valve.  7. The aortic valve is tricuspid. Mild sclerosis of the aortic valve. Aortic valve regurgitation is trivial by color flow Doppler. No stenosis of the aortic valve.  8. The aorta is normal  unless otherwise  noted.  SUMMARY   LVEF 60-65%, mild LVH, normal wall motion, grade 1 DD, indeterminate LV filling pressure, normal RV function, mild LAE, aortic valve sclerosis with trivial AI, no valvular vegetations  Antimicrobials: Ancef from 08/27/2019 onwards   Subjective: Patient seen and examined at bedside.  No overnight fever or vomiting reported.  Still feels weak lower extremities with complaints of back pain.  Has not gotten out of bed yet. Objective: Vitals:   08/29/19 1655 08/29/19 2016 08/30/19 0016 08/30/19 0416  BP: 120/69 108/67  117/74  Pulse: 78 82  94  Resp: 20 16  18   Temp: 97.6 F (36.4 C) 98.4 F (36.9 C) 98.1 F (36.7 C) 98.2 F (36.8 C)  TempSrc: Oral Axillary Axillary Axillary  SpO2: 94% 92%  95%  Weight:    117.9 kg    Intake/Output Summary (Last 24 hours) at 08/30/2019 0805 Last data filed at 08/30/2019 0533 Gross per 24 hour  Intake 430 ml  Output 1400 ml  Net -970 ml   Filed Weights   08/29/19 0500 08/30/19 0416  Weight: 118 kg 117.9 kg    Examination:  General exam: No distress.  More awake this morning.  Still slow to respond and a poor historian currently. Respiratory system: Bilateral decreased breath sounds at bases with no wheezing.  Some scattered crackles  cardiovascular system: Rate controlled, S1-S2 heard gastrointestinal system: Abdomen is nondistended, soft and nontender. Normal bowel sounds heard. Extremities: No cyanosis; trace edema  Data Reviewed: I have personally reviewed following labs and imaging studies  CBC: Recent Labs  Lab 08/27/19 0908 08/28/19 0415 08/29/19 0548 08/30/19 0347  WBC 12.0* 15.8* 12.9* 12.3*  NEUTROABS 9.8*  --  9.6* 8.6*  HGB 19.4* 17.9* 16.0 15.5  HCT 56.4* 51.2 46.7 45.5  MCV 89.7 89.5 90.2 90.6  PLT 103* 91* 73* 76*   Basic Metabolic Panel: Recent Labs  Lab 08/27/19 0908 08/28/19 0415 08/29/19 0548 08/30/19 0347  NA 135 131* 134* 132*  K 4.3 4.4 4.0 4.3  CL 99 97* 100 100  CO2 21* 21* 24  23  GLUCOSE 173* 230* 158* 141*  BUN 36* 37* 30* 24*  CREATININE 0.97 1.48* 0.97 0.87  CALCIUM 10.1 9.3 9.2 9.0  MG  --   --  2.0 1.8   GFR: Estimated Creatinine Clearance: 107.8 mL/min (by C-G formula based on SCr of 0.87 mg/dL). Liver Function Tests: Recent Labs  Lab 08/27/19 0908 08/28/19 0415 08/29/19 0548  AST 20 22 24   ALT 48* 40 40  ALKPHOS 103 112 92  BILITOT 1.7* 1.8* 1.1  PROT 7.5 6.1* 5.4*  ALBUMIN 3.5 2.9* 2.4*   No results for input(s): LIPASE, AMYLASE in the last 168 hours. No results for input(s): AMMONIA in the last 168 hours. Coagulation Profile: Recent Labs  Lab 08/27/19 1531  INR 1.1   Cardiac Enzymes: Recent Labs  Lab 08/27/19 1705  CKTOTAL 30*   BNP (last 3 results) No results for input(s): PROBNP in the last 8760 hours. HbA1C: No results for input(s): HGBA1C in the last 72 hours. CBG: Recent Labs  Lab 08/28/19 1644 08/28/19 2057 08/29/19 0807 08/29/19 1221 08/29/19 1648  GLUCAP 183* 159* 153* 141* 146*   Lipid Profile: No results for input(s): CHOL, HDL, LDLCALC, TRIG, CHOLHDL, LDLDIRECT in the last 72 hours. Thyroid Function Tests: Recent Labs    08/27/19 1705  TSH 1.414   Anemia Panel: No results for input(s): VITAMINB12, FOLATE, FERRITIN, TIBC, IRON, RETICCTPCT in the  last 72 hours. Sepsis Labs: No results for input(s): PROCALCITON, LATICACIDVEN in the last 168 hours.  Recent Results (from the past 240 hour(s))  Culture, blood (routine x 2)     Status: Abnormal   Collection Time: 08/27/19  8:54 AM   Specimen: BLOOD  Result Value Ref Range Status   Specimen Description BLOOD RIGHT ANTECUBITAL  Final   Special Requests   Final    BOTTLES DRAWN AEROBIC ONLY Blood Culture adequate volume   Culture  Setup Time   Final    GRAM POSITIVE COCCI IN CLUSTERS AEROBIC BOTTLE ONLY CRITICAL RESULT CALLED TO, READ BACK BY AND VERIFIED WITH: G. ABBOTT,PHARMD 0217 08/28/2019 T. TYSOR    Culture (A)  Final    STAPHYLOCOCCUS  AUREUS SUSCEPTIBILITIES PERFORMED ON PREVIOUS CULTURE WITHIN THE LAST 5 DAYS. Performed at [facility], [place] [address], [place], [place] [address]    Report Status 08/29/2019 FINAL  Final  SARS CORONAVIRUS 2 (TAT 6-12 HRS) Nasal Swab Aptima Multi Swab     Status: None   Collection Time: 08/27/19  9:09 AM   Specimen: Aptima Multi Swab; Nasal Swab  Result Value Ref Range Status   SARS Coronavirus 2 NEGATIVE NEGATIVE Final    Comment: (NOTE) SARS-CoV-2 target nucleic acids are NOT DETECTED. The SARS-CoV-2 RNA is generally detectable in upper and lower respiratory specimens during the acute phase of infection. Negative results do not preclude SARS-CoV-2 infection, do not rule out co-infections with other pathogens, and should not be used as the sole basis for treatment or other patient management decisions. Negative results must be combined with clinical observations, patient history, and epidemiological information. The expected result is Negative. Fact Sheet for Patients: SugarRoll.be Fact Sheet for Healthcare Providers: https://www.woods-mathews.com/ This test is not yet approved or cleared by the [place] FDA and  has been authorized for detection and/or diagnosis of SARS-CoV-2 by FDA under an Emergency Use Authorization (EUA). This EUA will remain  in effect (meaning this test can be used) for the duration of the COVID-19 declaration under Section 56 4(b)(1) of the Act, 21 U.S.C. section 360bbb-3(b)(1), unless the authorization is terminated or revoked sooner. Performed at [facility], [place] [address], [place], [place] [address]   Culture, blood (routine x 2)     Status: Abnormal   Collection Time: 08/27/19  9:51 AM   Specimen: BLOOD RIGHT ARM  Result Value Ref Range Status   Specimen Description BLOOD RIGHT ARM  Final   Special Requests   Final    BOTTLES DRAWN AEROBIC AND ANAEROBIC Blood Culture adequate volume    Culture  Setup Time   Final    GRAM POSITIVE COCCI IN CLUSTERS IN BOTH AEROBIC AND ANAEROBIC BOTTLES CRITICAL RESULT CALLED TO, READ BACK BY AND VERIFIED WITH: G. ABBOTT,PHARMD 0217 08/28/2019 Mena Goes Performed at [facility], [place] [address] [place], [place] [address]    Culture STAPHYLOCOCCUS AUREUS (A)  Final   Report Status 08/29/2019 FINAL  Final   Organism ID, Bacteria STAPHYLOCOCCUS AUREUS  Final      Susceptibility   Staphylococcus aureus - MIC*    CIPROFLOXACIN <=0.5 SENSITIVE Sensitive     ERYTHROMYCIN <=0.25 SENSITIVE Sensitive     GENTAMICIN <=0.5 SENSITIVE Sensitive     OXACILLIN <=0.25 SENSITIVE Sensitive     TETRACYCLINE <=1 SENSITIVE Sensitive     VANCOMYCIN <=0.5 SENSITIVE Sensitive     TRIMETH/SULFA <=10 SENSITIVE Sensitive     CLINDAMYCIN <=0.25 SENSITIVE Sensitive  RIFAMPIN <=0.5 SENSITIVE Sensitive     Inducible Clindamycin NEGATIVE Sensitive     * STAPHYLOCOCCUS AUREUS  Blood Culture ID Panel (Reflexed)     Status: Abnormal   Collection Time: 08/27/19  9:51 AM  Result Value Ref Range Status   Enterococcus species NOT DETECTED NOT DETECTED Final   Listeria monocytogenes NOT DETECTED NOT DETECTED Final   Staphylococcus species DETECTED (A) NOT DETECTED Final    Comment: CRITICAL RESULT CALLED TO, READ BACK BY AND VERIFIED WITH: G. ABBOTT,PHARMD 0217 08/28/2019 T. TYSOR    Staphylococcus aureus (BCID) DETECTED (A) NOT DETECTED Final    Comment: Methicillin (oxacillin) susceptible Staphylococcus aureus (MSSA). Preferred therapy is anti staphylococcal beta lactam antibiotic (Cefazolin or Nafcillin), unless clinically contraindicated. CRITICAL RESULT CALLED TO, READ BACK BY AND VERIFIED WITH: G. ABBOTT,PHARMD 0217 08/28/2019 T. TYSOR    Methicillin resistance NOT DETECTED NOT DETECTED Final   Streptococcus species NOT DETECTED NOT DETECTED Final   Streptococcus agalactiae NOT DETECTED NOT DETECTED Final   Streptococcus pneumoniae NOT DETECTED NOT  DETECTED Final   Streptococcus pyogenes NOT DETECTED NOT DETECTED Final   Acinetobacter baumannii NOT DETECTED NOT DETECTED Final   Enterobacteriaceae species NOT DETECTED NOT DETECTED Final   Enterobacter cloacae complex NOT DETECTED NOT DETECTED Final   Escherichia coli NOT DETECTED NOT DETECTED Final   Klebsiella oxytoca NOT DETECTED NOT DETECTED Final   Klebsiella pneumoniae NOT DETECTED NOT DETECTED Final   Proteus species NOT DETECTED NOT DETECTED Final   Serratia marcescens NOT DETECTED NOT DETECTED Final   Haemophilus influenzae NOT DETECTED NOT DETECTED Final   Neisseria meningitidis NOT DETECTED NOT DETECTED Final   Pseudomonas aeruginosa NOT DETECTED NOT DETECTED Final   Candida albicans NOT DETECTED NOT DETECTED Final   Candida glabrata NOT DETECTED NOT DETECTED Final   Candida krusei NOT DETECTED NOT DETECTED Final   Candida parapsilosis NOT DETECTED NOT DETECTED Final   Candida tropicalis NOT DETECTED NOT DETECTED Final    Comment: Performed at [facility], [place]. [address], [place], [place] [address]  MRSA PCR Screening     Status: None   Collection Time: 08/28/19  7:54 AM   Specimen: Nasal Mucosa; Nasopharyngeal  Result Value Ref Range Status   MRSA by PCR NEGATIVE NEGATIVE Final    Comment:        The GeneXpert MRSA Assay (FDA approved for NASAL specimens only), is one component of a comprehensive MRSA colonization surveillance program. It is not intended to diagnose MRSA infection nor to guide or monitor treatment for MRSA infections. 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 EXAM: CT ANGIOGRAPHY HEAD AND NECK TECHNIQUE: Multidetector CT imaging of the head and neck was performed using the standard protocol during  bolus administration of intravenous contrast. Multiplanar CT image reconstructions and MIPs were obtained to evaluate the vascular anatomy. Carotid stenosis measurements (when applicable) are obtained utilizing NASCET criteria, using the distal internal carotid diameter as the denominator. CONTRAST:  59mL OMNIPAQUE IOHEXOL 350 MG/ML SOLN COMPARISON:  MRI yesterday. FINDINGS: CT HEAD FINDINGS Brain: Minimal chronic small-vessel changes of the white matter. No sign of acute infarction, mass lesion, hemorrhage, hydrocephalus or extra-axial collection. Vascular: Negative Skull: Normal Sinuses: Clear. Right posterior nasopharyngeal polyp or mass  again seen. Orbits: Normal Review of the MIP images confirms the above findings CTA NECK FINDINGS Aortic arch: Aortic atherosclerosis. Ectatic ascending aorta with maximal diameter of 4.2 cm. Right carotid system: Common carotid artery widely patent to the bifurcation. Calcified plaque at the carotid bifurcation and ICA bulb. No stenosis. Cervical ICA widely patent beyond that. Left carotid system: Common carotid artery widely patent to the bifurcation. Calcified plaque at the ICA bulb but no stenosis. Cervical ICA normal beyond that. Vertebral arteries: Atherosclerotic plaque at the proximal right subclavian artery but no stenosis. Right vertebral artery origin widely patent. Right vertebral artery widely patent through the cervical region to the foramen magnum. Atherosclerotic disease at the left subclavian artery origin with 50% stenosis. Left vertebral artery origin widely patent. Non dominant left vertebral artery widely patent through the cervical region to the foramen magnum. Skeleton: Ordinary cervical spondylosis. Other neck: Right posterior nasopharyngeal polyp or mass as previously noted. No other neck finding. Upper chest: Scarring and emphysema in the upper lobes. Review of the MIP images confirms the above findings CTA HEAD FINDINGS Anterior circulation: Both  internal carotid arteries are patent through the skull base and siphon regions. Ordinary siphon atherosclerotic calcification without stenosis. The anterior and middle cerebral vessels are normal without proximal stenosis, aneurysm or vascular malformation. No large or medium vessel occlusion. No sign of gross distal vessel irregularity. Posterior circulation: Both vertebral arteries are patent at the foramen magnum level. There is atherosclerotic calcification in the right V4 segment with stenosis of 30-50%. Small left vertebral artery terminates in PICA. Basilar artery is tortuous but does not show focal stenosis. Superior cerebellar and posterior cerebral arteries are patent. Posterior cerebral arteries receive most of there supply from patent posterior communicating arteries. No distal vessel irregularity is appreciable. Venous sinuses: Patent and normal. Anatomic variants: None significant otherwise. Review of the MIP images confirms the above findings IMPRESSION: No gross evidence of CSF vasculitis. Nonstenotic atherosclerotic change at both carotid bifurcations. 50% stenosis of the subclavian artery origin on the left. 30-50% stenosis of the right vertebral artery V4 segment. No posterior circulation large or medium vessel occlusion. Posterior cerebral arteries receive most of there supply from the anterior circulation. Ectatic ascending aorta, diameter 4.2 cm. Recommend annual imaging followup by CTA or MRA. This recommendation follows 2010 ACCF/AHA/AATS/ACR/ASA/SCA/SCAI/SIR/STS/SVM Guidelines for the Diagnosis and Management of Patients with Thoracic Aortic Disease. Circulation. 2010; [id number]. Aortic aneurysm NOS (ICD10-I71.9) Electronically Signed   By: Nelson Chimes M.D.   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 EXAM: CT ANGIOGRAPHY HEAD AND  NECK TECHNIQUE: Multidetector CT imaging of the head and neck was performed using the standard protocol during bolus administration of intravenous contrast. Multiplanar CT image reconstructions and MIPs were obtained to evaluate the vascular anatomy. Carotid stenosis measurements (when applicable) are obtained utilizing NASCET criteria, using the distal internal carotid diameter as the denominator. CONTRAST:  25mL OMNIPAQUE IOHEXOL 350 MG/ML SOLN COMPARISON:  MRI yesterday. FINDINGS: CT HEAD FINDINGS Brain: Minimal chronic small-vessel changes of the white matter. No sign of acute infarction, mass lesion, hemorrhage, hydrocephalus or extra-axial collection. Vascular: Negative Skull: Normal Sinuses: Clear. Right posterior nasopharyngeal polyp or mass again seen. Orbits: Normal Review of the MIP images confirms the above findings CTA NECK FINDINGS Aortic arch: Aortic atherosclerosis. Ectatic ascending aorta with maximal diameter of 4.2 cm. Right  carotid system: Common carotid artery widely patent to the bifurcation. Calcified plaque at the carotid bifurcation and ICA bulb. No stenosis. Cervical ICA widely patent beyond that. Left carotid system: Common carotid artery widely patent to the bifurcation. Calcified plaque at the ICA bulb but no stenosis. Cervical ICA normal beyond that. Vertebral arteries: Atherosclerotic plaque at the proximal right subclavian artery but no stenosis. Right vertebral artery origin widely patent. Right vertebral artery widely patent through the cervical region to the foramen magnum. Atherosclerotic disease at the left subclavian artery origin with 50% stenosis. Left vertebral artery origin widely patent. Non dominant left vertebral artery widely patent through the cervical region to the foramen magnum. Skeleton: Ordinary cervical spondylosis. Other neck: Right posterior nasopharyngeal polyp or mass as previously noted. No other neck finding. Upper chest: Scarring and emphysema in the upper  lobes. Review of the MIP images confirms the above findings CTA HEAD FINDINGS Anterior circulation: Both internal carotid arteries are patent through the skull base and siphon regions. Ordinary siphon atherosclerotic calcification without stenosis. The anterior and middle cerebral vessels are normal without proximal stenosis, aneurysm or vascular malformation. No large or medium vessel occlusion. No sign of gross distal vessel irregularity. Posterior circulation: Both vertebral arteries are patent at the foramen magnum level. There is atherosclerotic calcification in the right V4 segment with stenosis of 30-50%. Small left vertebral artery terminates in PICA. Basilar artery is tortuous but does not show focal stenosis. Superior cerebellar and posterior cerebral arteries are patent. Posterior cerebral arteries receive most of there supply from patent posterior communicating arteries. No distal vessel irregularity is appreciable. Venous sinuses: Patent and normal. Anatomic variants: None significant otherwise. Review of the MIP images confirms the above findings IMPRESSION: No gross evidence of CSF vasculitis. Nonstenotic atherosclerotic change at both carotid bifurcations. 50% stenosis of the subclavian artery origin on the left. 30-50% stenosis of the right vertebral artery V4 segment. No posterior circulation large or medium vessel occlusion. Posterior cerebral arteries receive most of there supply from the anterior circulation. Ectatic ascending aorta, diameter 4.2 cm. Recommend annual imaging followup by CTA or MRA. This recommendation follows 2010 ACCF/AHA/AATS/ACR/ASA/SCA/SCAI/SIR/STS/SVM Guidelines for the Diagnosis and Management of Patients with Thoracic Aortic Disease. Circulation. 2010; [id number]. Aortic aneurysm NOS (ICD10-I71.9) Electronically Signed   By: Nelson Chimes M.D.   On: 08/29/2019 12:01        Scheduled Meds: . acetaminophen  325 mg Oral Once  . atorvastatin  80 mg Oral QHS  .  brimonidine  1 drop Both Eyes TID  . dofetilide  500 mcg Oral BID  . insulin aspart  0-15 Units Subcutaneous TID WC  . insulin aspart  0-5 Units Subcutaneous QHS  . ipratropium-albuterol  3 mL Nebulization TID  . loratadine  10 mg Oral Daily  . pantoprazole  40 mg Oral Daily  . polyethylene glycol  17 g Oral BID  . senna-docusate  1 tablet Oral BID   Continuous Infusions: .  ceFAZolin (ANCEF) IV 2 g (08/30/19 0533)     LOS: 3 days        Aline August, MD [facility] 08/30/2019, 8:05 AM

## 2019-08-30 NOTE — Progress Notes (Signed)
[name]  Date of Admission:  08/27/2019      Total days of antibiotics 4  Day 4 cefazolin            ASSESSMENT: [name] [name]'s left shoulder and left sternoclavicular joints are both with significant tenderness to palpation and appear swollen. Will obtain MRI of left shoulder and sternum to evaluate further given his MSSA bacteremia. If these appear infected he will require orthopedic and CT surgery to evaluate for consideration of debridement. Transthoracic echo is negative for any large vegetations - AV has trivial regurgitation. LVEF normal @60 -65%. No surrogate or confirming signs of SBE.   Continue cefazolin for now. Follow blood cultures for clearance (neg @ 24h). Hold on PICC for now.     PLAN: 1. Continue Cefazolin  2. Hold on PICC line 3. Follow micro culture 4. MRI of left shoulder and sternum W/ and W/O contrast.    Principal Problem:   MSSA bacteremia Active Problems:   Discitis of lumbar region   Hyperlipidemia   Obstructive sleep apnea   Type 2 diabetes mellitus with other circulatory complications (HCC)   Atrial fibrillation (HCC)   Myelopathy (HCC)   H/O optic neuritis   Left shoulder pain   Pain of left clavicle   . acetaminophen  325 mg Oral Once  . atorvastatin  80 mg Oral QHS  . brimonidine  1 drop Both Eyes TID  . dofetilide  500 mcg Oral BID  . insulin aspart  0-15 Units Subcutaneous TID WC  . insulin aspart  0-5 Units Subcutaneous QHS  . ipratropium-albuterol  3 mL Nebulization TID  . lidocaine (PF)  5 mL Intradermal Once  . loratadine  10 mg Oral Daily  . pantoprazole  40 mg Oral Daily  . polyethylene glycol  17 g Oral BID  . senna-docusate  1 tablet Oral BID    SUBJECTIVE: Increased pain and limited range of motion of the left shoulder. Unable to raise up. He is concerned this is due to cervical spine infection. He describes a significant weight change over the last 3 weeks "30 lbs." He has not eaten much  over the last 2 weeks. He had an episode of cellulitis of the right lower ankle that resolved with 10 days of oral antibiotic. During this treatment he noticed increased back pain that has progressively worsened since. Friday of last week he went to the ER after he could not move his upper extremities.   Review of Systems: Review of Systems  Constitutional: Positive for weight loss. Negative for chills and fever.  Respiratory: Negative for cough.   Cardiovascular: Positive for chest pain. Negative for leg swelling.  Gastrointestinal: Negative for abdominal pain and vomiting.  Genitourinary: Negative for dysuria.  Musculoskeletal: Positive for back pain and joint pain (L [place] joint and L Shoulder ).  Neurological: Positive for weakness. Negative for headaches.    Allergies  Allergen Reactions  . Novocain [Procaine] Hives    Dentist appointment in 1968; since then has tolerated lidocaine and provocaine with no hives or difficulty.  . Penicillins     Childhood reaction-details unknown  . Pseudoephedrine Other (See Comments)    Patient went into afib  . Sulfonamide Derivatives     Childhood reaction   . Cardizem [Diltiazem] Rash    Also 2019  . Pseudoephedrine Hcl Palpitations    OBJECTIVE: Vitals:   08/30/19 0816 08/30/19 1038 08/30/19 1355 08/30/19 1509  BP: 126/70  [name])  143/75   Pulse: 82  77   Resp: 19  16   Temp:  97.8 F (36.6 C) 97.9 F (36.6 C)   TempSrc:  Oral Oral   SpO2: 94%  90% 95%  Weight:       Body mass index is 34.29 kg/m.  Physical Exam Constitutional:      Comments: Uncomfortable. Resting in bed after PT/OT therapy.   HENT:     Mouth/Throat:     Mouth: Mucous membranes are moist.  Eyes:     General: No scleral icterus. Cardiovascular:     Rate and Rhythm: Normal rate and regular rhythm.     Heart sounds: No murmur.  Pulmonary:     Effort: Pulmonary effort is normal.     Breath sounds: Normal breath sounds.  Abdominal:     General: Bowel sounds  are normal. There is no distension.  Musculoskeletal:       Arms:     Comments: + lower back pain   Skin:    General: Skin is warm and dry.     Capillary Refill: Capillary refill takes less than 2 seconds.  Neurological:     Mental Status: He is alert and oriented to person, place, and time.     Lab Results Lab Results  Component Value Date   WBC 12.3 (H) 08/30/2019   HGB 15.5 08/30/2019   HCT 45.5 08/30/2019   MCV 90.6 08/30/2019   PLT 76 (L) 08/30/2019    Lab Results  Component Value Date   CREATININE 0.87 08/30/2019   BUN 24 (H) 08/30/2019   NA 132 (L) 08/30/2019   K 4.3 08/30/2019   CL 100 08/30/2019   CO2 23 08/30/2019    Lab Results  Component Value Date   ALT 40 08/29/2019   AST 24 08/29/2019   ALKPHOS 92 08/29/2019   BILITOT 1.1 08/29/2019     Microbiology: Recent Results (from the past 240 hour(s))  Culture, blood (routine x 2)     Status: Abnormal   Collection Time: 08/27/19  8:54 AM   Specimen: BLOOD  Result Value Ref Range Status   Specimen Description BLOOD RIGHT ANTECUBITAL  Final   Special Requests   Final    BOTTLES DRAWN AEROBIC ONLY Blood Culture adequate volume   Culture  Setup Time   Final    GRAM POSITIVE COCCI IN CLUSTERS AEROBIC BOTTLE ONLY CRITICAL RESULT CALLED TO, READ BACK BY AND VERIFIED WITH: G. ABBOTT,PHARMD 0217 08/28/2019 T. TYSOR    Culture (A)  Final    STAPHYLOCOCCUS AUREUS SUSCEPTIBILITIES PERFORMED ON PREVIOUS CULTURE WITHIN THE LAST 5 DAYS. Performed at [facility], [place] [address], [place], [place] [address]    Report Status 08/29/2019 FINAL  Final  SARS CORONAVIRUS 2 (TAT 6-12 HRS) Nasal Swab Aptima Multi Swab     Status: None   Collection Time: 08/27/19  9:09 AM   Specimen: Aptima Multi Swab; Nasal Swab  Result Value Ref Range Status   SARS Coronavirus 2 NEGATIVE NEGATIVE Final    Comment: (NOTE) SARS-CoV-2 target nucleic acids are NOT DETECTED. The SARS-CoV-2 RNA is generally detectable in upper and  lower respiratory specimens during the acute phase of infection. Negative results do not preclude SARS-CoV-2 infection, do not rule out co-infections with other pathogens, and should not be used as the sole basis for treatment or other patient management decisions. Negative results must be combined with clinical observations, patient history, and epidemiological information. The expected result is Negative.  Fact Sheet for Patients: SugarRoll.be Fact Sheet for Healthcare Providers: https://www.woods-mathews.com/ This test is not yet approved or cleared by the [place] FDA and  has been authorized for detection and/or diagnosis of SARS-CoV-2 by FDA under an Emergency Use Authorization (EUA). This EUA will remain  in effect (meaning this test can be used) for the duration of the COVID-19 declaration under Section 56 4(b)(1) of the Act, 21 U.S.C. section 360bbb-3(b)(1), unless the authorization is terminated or revoked sooner. Performed at [facility], [place] [address], [place], [place] [address]   Culture, blood (routine x 2)     Status: Abnormal   Collection Time: 08/27/19  9:51 AM   Specimen: BLOOD RIGHT ARM  Result Value Ref Range Status   Specimen Description BLOOD RIGHT ARM  Final   Special Requests   Final    BOTTLES DRAWN AEROBIC AND ANAEROBIC Blood Culture adequate volume   Culture  Setup Time   Final    GRAM POSITIVE COCCI IN CLUSTERS IN BOTH AEROBIC AND ANAEROBIC BOTTLES CRITICAL RESULT CALLED TO, READ BACK BY AND VERIFIED WITH: G. ABBOTT,PHARMD 0217 08/28/2019 Mena Goes Performed at [facility], [place] [address] [place], [place] [address]    Culture STAPHYLOCOCCUS AUREUS (A)  Final   Report Status 08/29/2019 FINAL  Final   Organism ID, Bacteria STAPHYLOCOCCUS AUREUS  Final      Susceptibility   Staphylococcus aureus - MIC*    CIPROFLOXACIN <=0.5 SENSITIVE Sensitive     ERYTHROMYCIN <=0.25 SENSITIVE Sensitive      GENTAMICIN <=0.5 SENSITIVE Sensitive     OXACILLIN <=0.25 SENSITIVE Sensitive     TETRACYCLINE <=1 SENSITIVE Sensitive     VANCOMYCIN <=0.5 SENSITIVE Sensitive     TRIMETH/SULFA <=10 SENSITIVE Sensitive     CLINDAMYCIN <=0.25 SENSITIVE Sensitive     RIFAMPIN <=0.5 SENSITIVE Sensitive     Inducible Clindamycin NEGATIVE Sensitive     * STAPHYLOCOCCUS AUREUS  Blood Culture ID Panel (Reflexed)     Status: Abnormal   Collection Time: 08/27/19  9:51 AM  Result Value Ref Range Status   Enterococcus species NOT DETECTED NOT DETECTED Final   Listeria monocytogenes NOT DETECTED NOT DETECTED Final   Staphylococcus species DETECTED (A) NOT DETECTED Final    Comment: CRITICAL RESULT CALLED TO, READ BACK BY AND VERIFIED WITH: G. ABBOTT,PHARMD 0217 08/28/2019 T. TYSOR    Staphylococcus aureus (BCID) DETECTED (A) NOT DETECTED Final    Comment: Methicillin (oxacillin) susceptible Staphylococcus aureus (MSSA). Preferred therapy is anti staphylococcal beta lactam antibiotic (Cefazolin or Nafcillin), unless clinically contraindicated. CRITICAL RESULT CALLED TO, READ BACK BY AND VERIFIED WITH: G. ABBOTT,PHARMD 0217 08/28/2019 T. TYSOR    Methicillin resistance NOT DETECTED NOT DETECTED Final   Streptococcus species NOT DETECTED NOT DETECTED Final   Streptococcus agalactiae NOT DETECTED NOT DETECTED Final   Streptococcus pneumoniae NOT DETECTED NOT DETECTED Final   Streptococcus pyogenes NOT DETECTED NOT DETECTED Final   Acinetobacter baumannii NOT DETECTED NOT DETECTED Final   Enterobacteriaceae species NOT DETECTED NOT DETECTED Final   Enterobacter cloacae complex NOT DETECTED NOT DETECTED Final   Escherichia coli NOT DETECTED NOT DETECTED Final   Klebsiella oxytoca NOT DETECTED NOT DETECTED Final   Klebsiella pneumoniae NOT DETECTED NOT DETECTED Final   Proteus species NOT DETECTED NOT DETECTED Final   Serratia marcescens NOT DETECTED NOT DETECTED Final   Haemophilus influenzae NOT DETECTED NOT  DETECTED Final   Neisseria meningitidis NOT DETECTED NOT DETECTED Final   Pseudomonas aeruginosa NOT  DETECTED NOT DETECTED Final   Candida albicans NOT DETECTED NOT DETECTED Final   Candida glabrata NOT DETECTED NOT DETECTED Final   Candida krusei NOT DETECTED NOT DETECTED Final   Candida parapsilosis NOT DETECTED NOT DETECTED Final   Candida tropicalis NOT DETECTED NOT DETECTED Final    Comment: Performed at [facility], [place] [address] [place], [place] [address]  MRSA PCR Screening     Status: None   Collection Time: 08/28/19  7:54 AM   Specimen: Nasal Mucosa; Nasopharyngeal  Result Value Ref Range Status   MRSA by PCR NEGATIVE NEGATIVE Final    Comment:        The GeneXpert MRSA Assay (FDA approved for NASAL specimens only), is one component of a comprehensive MRSA colonization surveillance program. It is not intended to diagnose MRSA infection nor to guide or monitor treatment for MRSA infections. Performed at [facility], [place] [address] [place], [place] [address]   Culture, blood (routine x 2)     Status: None (Preliminary result)   Collection Time: 08/29/19  5:48 AM   Specimen: BLOOD  Result Value Ref Range Status   Specimen Description BLOOD RIGHT HAND  Final   Special Requests   Final    BOTTLES DRAWN AEROBIC ONLY Blood Culture adequate volume   Culture   Final    NO GROWTH 1 DAY Performed at [facility], [place] [address], [place], [place] [address]    Report Status PENDING  Incomplete  Culture, blood (routine x 2)     Status: None (Preliminary result)   Collection Time: 08/29/19  5:51 AM   Specimen: BLOOD  Result Value Ref Range Status   Specimen Description BLOOD LEFT ARM  Final   Special Requests   Final    BOTTLES DRAWN AEROBIC AND ANAEROBIC Blood Culture adequate volume   Culture   Final    NO GROWTH 1 DAY Performed at [facility], [place] [address], [place], [place] [address]    Report Status PENDING  Incomplete    [name], MSN, NP-C [facility] [email or web address] Pager: [phone] Office: [phone] [place]: [phone]

## 2019-08-30 NOTE — Evaluation (Signed)
Occupational Therapy Evaluation Patient Details Name: [name] [name]. MRN: [id number] DOB: 30-Jan-1951 Today's Date: 08/30/2019    History of Present Illness [name] [name]. is a 68 y.o. male with medical history significant of OSA on CPAP; perforated appy (2015); obesity; CAD s/p stents; HLD; DM; optic nerve neuritis; and afib on Pradaxa presenting with back pain.   Clinical Impression   This 68 yo male admitted with above presents to acute OT with increased pain, decreased mobility, decreased AROM Bil UEs all affecting his normal PLOF of being totally independent. He will benefit from acute OT with follow up on CIR.      Follow Up Recommendations  CIR;Supervision/Assistance - 24 hour    Equipment Recommendations  Other (comment)(TBD next venue)       Precautions / Restrictions Precautions Precautions: Fall Precaution Comments: mod fall Restrictions Weight Bearing Restrictions: No      Mobility Bed Mobility Overal bed mobility: Needs Assistance Bed Mobility: Rolling;Sidelying to Sit;Sit to Sidelying Rolling: Mod assist;+2 for physical assistance Sidelying to sit: Mod assist;+2 for physical assistance     Sit to sidelying: Min assist;+2 for physical assistance General bed mobility comments: Patient is limited by pain.  Transfers                 General transfer comment: unable to tolerate this visit    Balance Overall balance assessment: Needs assistance Sitting-balance support: Feet supported;Bilateral upper extremity supported Sitting balance-Leahy Scale: Poor Sitting balance - Comments: requires min/min guard assist in sitting due to pain in LB, unable to tolerate this position for very long Postural control: Posterior lean                                 ADL either performed or assessed with clinical judgement   ADL Overall ADL's : Needs assistance/impaired Eating/Feeding: Independent;Bed level   Grooming: Set up;Bed level    Upper Body Bathing: Minimal assistance;Bed level   Lower Body Bathing: Total assistance;Bed level   Upper Body Dressing : Maximal assistance;Bed level   Lower Body Dressing: Total assistance;Bed level                       Vision Patient Visual Report: No change from baseline              Pertinent Vitals/Pain Pain Assessment: 0-10 Pain Score: 8  Pain Location: left>right  pect/shoulder area with rolling and lower back (sitting up on EOB)--lower lumbar/SI Pain Descriptors / Indicators: Aching;Grimacing;Guarding;Sore;Stabbing Pain Intervention(s): Limited activity within patient's tolerance;Monitored during session;Repositioned;RN gave pain meds during session     Hand Dominance Right   Extremity/Trunk Assessment Upper Extremity Assessment Upper Extremity Assessment: RUE deficits/detail;LUE deficits/detail RUE Deficits / Details: Slow deliberate shoulder movements (per pt due to pain and weakness), elbow to hand WNL RUE Coordination: decreased gross motor LUE Deficits / Details: Slow deliberate shoulder movements (per pt due to pain and weakness), elbow to hand WNL LUE Coordination: decreased gross motor   Lower Extremity Assessment Lower Extremity Assessment: Generalized weakness   Cervical / Trunk Assessment Cervical / Trunk Assessment: Normal   Communication Communication Communication: No difficulties   Cognition Arousal/Alertness: Awake/alert Behavior During Therapy: WFL for tasks assessed/performed Overall Cognitive Status: Within Functional Limits for tasks assessed  Home Living Family/patient expects to be discharged to:: Private residence Living Arrangements: Spouse/significant other Available Help at Discharge: Family;Available PRN/intermittently Type of Home: House Home Access: Stairs to enter [employer] of Steps: 2-3   Home Layout: One level                    Additional Comments: He may have some equipment at home, was not using prior to admission      Prior Functioning/Environment Level of Independence: Independent                 OT Problem List: Decreased range of motion;Decreased activity tolerance;Impaired balance (sitting and/or standing);Decreased knowledge of use of DME or AE;Impaired UE functional use;Pain      OT Treatment/Interventions: Self-care/ADL training;DME and/or AE instruction;Patient/family education;Balance training    OT Goals(Current goals can be found in the care plan section) Acute Rehab OT Goals Patient Stated Goal: to decrease pain OT Goal Formulation: With patient/family Time For Goal Achievement: 09/13/19 Potential to Achieve Goals: Good  OT Frequency: Min 2X/week           Co-evaluation PT/OT/SLP Co-Evaluation/Treatment: Yes Reason for Co-Treatment: For patient/therapist safety;To address functional/ADL transfers PT goals addressed during session: Mobility/safety with mobility;Balance OT goals addressed during session: ADL's and self-care;Strengthening/ROM      AM-PAC OT "6 Clicks" Daily Activity     Outcome Measure Help from another person eating meals?: None Help from another person taking care of personal grooming?: A Little Help from another person toileting, which includes using toliet, bedpan, or urinal?: Total Help from another person bathing (including washing, rinsing, drying)?: A Lot Help from another person to put on and taking off regular upper body clothing?: A Lot Help from another person to put on and taking off regular lower body clothing?: Total 6 Click Score: 13   End of Session Nurse Communication: (RN in room when we got pt to EOB and back down to supine)  Activity Tolerance: Patient limited by pain Patient left: in bed;with call bell/phone within reach;with bed alarm set  OT Visit Diagnosis: Other abnormalities of gait and mobility (R26.89);Muscle weakness  (generalized) (M62.81);Pain Pain - Right/Left: (both) Pain - part of body: Shoulder(and back)                Time: 0944-1000 OT Time Calculation (min): 16 min Charges:  OT General Charges $OT Visit: 1 Visit OT Evaluation $OT Eval Moderate Complexity: 1 Mod  08/30/2019 [place] Circle, OTR/L Acute Rehab Services Pager [phone] Office [phone]     Almon Register 08/30/2019, 12:35 PM

## 2019-08-30 NOTE — Progress Notes (Addendum)
Subjective: Continues to have neck pain.   Exam: Vitals:   08/30/19 0816 08/30/19 1038  BP: 126/70   Pulse: 82   Resp: 19   Temp:  97.8 F (36.6 C)  SpO2: 94%    Gen: In bed, NAD Resp: non-labored breathing, no acute distress Abd: soft, nt  Neuro: MS: Awake, alert, oriented [id number], EOMI Motor: mainly limited by pain, at least 4/5 throughout.  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  There is a possibility that the discitis could have spread continuously to involve the meninges.  For this reason, an LP had been recommended, however given that there could be extra-axial infection, there could be risk of spreading it to the intrathecal space. I would favor coverage for MSSA and does not appear septic. Ancef appears to have good penetration into CSF, but will defer to ID if this remains appropriate coverage.   Recommendations: 1) Would consider meningitic dosing for MSSA coverage.  2) will follow.   Roland Rack, MD [facility] [phone]  If 7pm- 7am, please page neurology on call as listed in [place].

## 2019-08-31 ENCOUNTER — Ambulatory Visit: Payer: Medicare Other | Admitting: Cardiology

## 2019-08-31 ENCOUNTER — Inpatient Hospital Stay (HOSPITAL_COMMUNITY): Payer: Medicare Other

## 2019-08-31 ENCOUNTER — Encounter (HOSPITAL_COMMUNITY)
Admission: EM | Disposition: A | Discharge status: Rehabilitation Facility | Payer: Self-pay | Source: Home / Self Care | Attending: Internal Medicine

## 2019-08-31 ENCOUNTER — Encounter (HOSPITAL_COMMUNITY): Payer: Self-pay | Admitting: Anesthesiology

## 2019-08-31 ENCOUNTER — Inpatient Hospital Stay (HOSPITAL_COMMUNITY): Payer: Medicare Other | Admitting: Anesthesiology

## 2019-08-31 DIAGNOSIS — M009 Pyogenic arthritis, unspecified: Secondary | ICD-10-CM

## 2019-08-31 DIAGNOSIS — M00012 Staphylococcal arthritis, left shoulder: Principal | ICD-10-CM

## 2019-08-31 DIAGNOSIS — I4891 Unspecified atrial fibrillation: Secondary | ICD-10-CM

## 2019-08-31 DIAGNOSIS — I2510 Atherosclerotic heart disease of native coronary artery without angina pectoris: Secondary | ICD-10-CM

## 2019-08-31 HISTORY — PX: STERNAL WOUND DEBRIDEMENT: SHX1058

## 2019-08-31 LAB — CBC WITH DIFFERENTIAL/PLATELET
Abs Immature Granulocytes: 0.07 10*3/uL (ref 0.00–0.07)
Basophils Absolute: 0 10*3/uL (ref 0.0–0.1)
Basophils Relative: 0 %
Eosinophils Absolute: 0.2 10*3/uL (ref 0.0–0.5)
Eosinophils Relative: 2 %
HCT: 45.3 % (ref 39.0–52.0)
Hemoglobin: 15.8 g/dL (ref 13.0–17.0)
Immature Granulocytes: 1 %
Lymphocytes Relative: 12 %
Lymphs Abs: 1.3 10*3/uL (ref 0.7–4.0)
MCH: 31 pg (ref 26.0–34.0)
MCHC: 34.9 g/dL (ref 30.0–36.0)
MCV: 89 fL (ref 80.0–100.0)
Monocytes Absolute: 1.5 10*3/uL — ABNORMAL HIGH (ref 0.1–1.0)
Monocytes Relative: 14 %
Neutro Abs: 7.6 10*3/uL (ref 1.7–7.7)
Neutrophils Relative %: 71 %
Platelets: 88 10*3/uL — ABNORMAL LOW (ref 150–400)
RBC: 5.09 MIL/uL (ref 4.22–5.81)
RDW: 13.6 % (ref 11.5–15.5)
WBC: 10.7 10*3/uL — ABNORMAL HIGH (ref 4.0–10.5)
nRBC: 0 % (ref 0.0–0.2)

## 2019-08-31 LAB — GLUCOSE, CAPILLARY
Glucose-Capillary: 149 mg/dL — ABNORMAL HIGH (ref 70–99)
Glucose-Capillary: 150 mg/dL — ABNORMAL HIGH (ref 70–99)
Glucose-Capillary: 146 mg/dL — ABNORMAL HIGH (ref 70–99)
Glucose-Capillary: 154 mg/dL — ABNORMAL HIGH (ref 70–99)
Glucose-Capillary: 226 mg/dL — ABNORMAL HIGH (ref 70–99)

## 2019-08-31 LAB — PROTIME-INR
INR: 1.2 (ref 0.8–1.2)
Prothrombin Time: 14.7 seconds (ref 11.4–15.2)

## 2019-08-31 LAB — SURGICAL PCR SCREEN
MRSA, PCR: NEGATIVE
Staphylococcus aureus: NEGATIVE

## 2019-08-31 LAB — APTT: aPTT: 31 seconds (ref 24–36)

## 2019-08-31 LAB — BASIC METABOLIC PANEL
Anion gap: 12 (ref 5–15)
BUN: 17 mg/dL (ref 8–23)
CO2: 24 mmol/L (ref 22–32)
Calcium: 9 mg/dL (ref 8.9–10.3)
Chloride: 97 mmol/L — ABNORMAL LOW (ref 98–111)
Creatinine, Ser: 0.79 mg/dL (ref 0.61–1.24)
GFR calc Af Amer: >60 mL/min (ref >60)
GFR calc non Af Amer: >60 mL/min (ref >60)
Glucose, Bld: 195 mg/dL — ABNORMAL HIGH (ref 70–99)
Potassium: 3.6 mmol/L (ref 3.5–5.1)
Sodium: 133 mmol/L — ABNORMAL LOW (ref 135–145)

## 2019-08-31 LAB — TYPE AND SCREEN
ABO/RH(D): A NEG
Antibody Screen: NEGATIVE

## 2019-08-31 LAB — MAGNESIUM: Magnesium: 1.8 mg/dL (ref 1.7–2.4)

## 2019-08-31 LAB — ABO/RH: ABO/RH(D): A NEG

## 2019-08-31 SURGERY — DEBRIDEMENT, WOUND, STERNUM
Anesthesia: General | Laterality: Left

## 2019-08-31 MED ORDER — SUGAMMADEX SODIUM 200 MG/2ML IV SOLN
INTRAVENOUS | Status: DC | PRN
  Administered 2019-08-31: 250 mg via INTRAVENOUS
  Administered 2019-08-31: 150 mg via INTRAVENOUS

## 2019-08-31 MED ORDER — DEXAMETHASONE SODIUM PHOSPHATE 10 MG/ML IJ SOLN
INTRAMUSCULAR | Status: AC
  Filled 2019-08-31: qty 1

## 2019-08-31 MED ORDER — CEFAZOLIN SODIUM-DEXTROSE 2-4 GM/100ML-% IV SOLN
2.0000 g | INTRAVENOUS | Status: DC
  Filled 2019-08-31 (×2): qty 100

## 2019-08-31 MED ORDER — LORAZEPAM 2 MG/ML IJ SOLN: 1.0000 mg | Freq: Once | INTRAMUSCULAR | Status: DC

## 2019-08-31 MED ORDER — FENTANYL CITRATE (PF) 250 MCG/5ML IJ SOLN
INTRAMUSCULAR | Status: DC | PRN
  Administered 2019-08-31: 50 ug via INTRAVENOUS
  Administered 2019-08-31: 100 ug via INTRAVENOUS
  Administered 2019-08-31 (×3): 50 ug via INTRAVENOUS

## 2019-08-31 MED ORDER — MAGNESIUM SULFATE 2 GM/50ML IV SOLN: 2.0000 g | Freq: Once | INTRAVENOUS | Status: DC

## 2019-08-31 MED ORDER — FENTANYL CITRATE (PF) 250 MCG/5ML IJ SOLN
INTRAMUSCULAR | Status: AC
  Filled 2019-08-31: qty 5

## 2019-08-31 MED ORDER — CEFAZOLIN SODIUM-DEXTROSE 2-3 GM-%(50ML) IV SOLR
INTRAVENOUS | Status: DC | PRN
  Administered 2019-08-31: 2 g via INTRAVENOUS

## 2019-08-31 MED ORDER — CHLORHEXIDINE GLUCONATE CLOTH 2 % EX PADS
6.0000 | MEDICATED_PAD | Freq: Once | CUTANEOUS | Status: AC
  Administered 2019-08-31: 6 via TOPICAL

## 2019-08-31 MED ORDER — DIPHENHYDRAMINE HCL 50 MG/ML IJ SOLN
25.0000 mg | Freq: Once | INTRAMUSCULAR | Status: AC | PRN
  Administered 2019-09-03: 25 mg via INTRAVENOUS
  Filled 2019-08-31 (×2): qty 1

## 2019-08-31 MED ORDER — ROCURONIUM BROMIDE 10 MG/ML (PF) SYRINGE
PREFILLED_SYRINGE | INTRAVENOUS | Status: DC | PRN
  Administered 2019-08-31: 40 mg via INTRAVENOUS

## 2019-08-31 MED ORDER — ONDANSETRON HCL 4 MG/2ML IJ SOLN
INTRAMUSCULAR | Status: DC | PRN
  Administered 2019-08-31: 4 mg via INTRAVENOUS

## 2019-08-31 MED ORDER — SUCCINYLCHOLINE CHLORIDE 200 MG/10ML IV SOSY
PREFILLED_SYRINGE | INTRAVENOUS | Status: DC | PRN
  Administered 2019-08-31: 120 mg via INTRAVENOUS

## 2019-08-31 MED ORDER — MEPERIDINE HCL 25 MG/ML IJ SOLN: 6.2500 mg | INTRAMUSCULAR | Status: DC | PRN

## 2019-08-31 MED ORDER — PROMETHAZINE HCL 25 MG/ML IJ SOLN: 6.2500 mg | INTRAMUSCULAR | Status: DC | PRN

## 2019-08-31 MED ORDER — ACETAMINOPHEN 325 MG PO TABS: 325.0000 mg | ORAL_TABLET | Freq: Once | ORAL | Status: DC | PRN

## 2019-08-31 MED ORDER — ACETAMINOPHEN 160 MG/5ML PO SOLN: 325.0000 mg | Freq: Once | ORAL | Status: DC | PRN

## 2019-08-31 MED ORDER — ACETAMINOPHEN 10 MG/ML IV SOLN
1000.0000 mg | Freq: Once | INTRAVENOUS | Status: DC | PRN
  Administered 2019-08-31: 1000 mg via INTRAVENOUS

## 2019-08-31 MED ORDER — MUPIROCIN 2 % EX OINT
1.0000 "application " | TOPICAL_OINTMENT | Freq: Two times a day (BID) | CUTANEOUS | Status: AC
  Administered 2019-09-01 – 2019-09-04 (×9): 1 via NASAL
  Filled 2019-08-31: qty 22

## 2019-08-31 MED ORDER — SODIUM CHLORIDE 0.9 % IV SOLN
2.0000 g | INTRAVENOUS | Status: DC
  Administered 2019-08-31 – 2019-09-15 (×86): 2 g via INTRAVENOUS
  Filled 2019-08-31 (×95): qty 2000

## 2019-08-31 MED ORDER — LACTATED RINGERS IV SOLN
INTRAVENOUS | Status: DC | PRN
  Administered 2019-08-31: 15:00:00 via INTRAVENOUS

## 2019-08-31 MED ORDER — PROPOFOL 10 MG/ML IV BOLUS
INTRAVENOUS | Status: DC | PRN
  Administered 2019-08-31: 130 mg via INTRAVENOUS

## 2019-08-31 MED ORDER — PROPOFOL 10 MG/ML IV BOLUS
INTRAVENOUS | Status: AC
  Filled 2019-08-31: qty 20

## 2019-08-31 MED ORDER — DEXAMETHASONE SODIUM PHOSPHATE 10 MG/ML IJ SOLN
INTRAMUSCULAR | Status: DC | PRN
  Administered 2019-08-31: 4 mg via INTRAVENOUS

## 2019-08-31 MED ORDER — HYDROMORPHONE HCL 1 MG/ML IJ SOLN
INTRAMUSCULAR | Status: AC
  Filled 2019-08-31: qty 1

## 2019-08-31 MED ORDER — LACTATED RINGERS IV SOLN: INTRAVENOUS | Status: DC

## 2019-08-31 MED ORDER — ARTIFICIAL TEARS OPHTHALMIC OINT
TOPICAL_OINTMENT | OPHTHALMIC | Status: AC
  Filled 2019-08-31: qty 3.5

## 2019-08-31 MED ORDER — CHLORHEXIDINE GLUCONATE CLOTH 2 % EX PADS: 6.0000 | MEDICATED_PAD | Freq: Once | CUTANEOUS | Status: DC

## 2019-08-31 MED ORDER — LACTATED RINGERS IV SOLN
INTRAVENOUS | Status: DC
  Administered 2019-08-31: 14:00:00 via INTRAVENOUS

## 2019-08-31 MED ORDER — MIDAZOLAM HCL 5 MG/5ML IJ SOLN
INTRAMUSCULAR | Status: DC | PRN
  Administered 2019-08-31: 1 mg via INTRAVENOUS

## 2019-08-31 MED ORDER — ACETAMINOPHEN 10 MG/ML IV SOLN
INTRAVENOUS | Status: AC
  Filled 2019-08-31: qty 100

## 2019-08-31 MED ORDER — AMOXICILLIN 500 MG PO CAPS
500.0000 mg | ORAL_CAPSULE | Freq: Once | ORAL | Status: AC
  Administered 2019-08-31: 12:00:00 500 mg via ORAL
  Filled 2019-08-31: qty 1

## 2019-08-31 MED ORDER — LORAZEPAM 2 MG/ML IJ SOLN
0.5000 mg | Freq: Once | INTRAMUSCULAR | Status: AC
  Administered 2019-09-01: 0.5 mg via INTRAVENOUS
  Filled 2019-08-31 (×2): qty 1

## 2019-08-31 MED ORDER — SODIUM CHLORIDE 0.9 % IR SOLN
Status: DC | PRN
  Administered 2019-08-31: 1000 mL

## 2019-08-31 MED ORDER — LIDOCAINE 2% (20 MG/ML) 5 ML SYRINGE
INTRAMUSCULAR | Status: DC | PRN
  Administered 2019-08-31: 40 mg via INTRAVENOUS

## 2019-08-31 MED ORDER — POTASSIUM CHLORIDE CRYS ER 20 MEQ PO TBCR
40.0000 meq | EXTENDED_RELEASE_TABLET | Freq: Three times a day (TID) | ORAL | Status: AC
  Administered 2019-08-31: 22:00:00 40 meq via ORAL
  Filled 2019-08-31: qty 2

## 2019-08-31 MED ORDER — HYDROMORPHONE HCL 1 MG/ML IJ SOLN
0.2500 mg | INTRAMUSCULAR | Status: DC | PRN
  Administered 2019-08-31 (×2): 0.25 mg via INTRAVENOUS

## 2019-08-31 MED ORDER — MIDAZOLAM HCL 2 MG/2ML IJ SOLN
INTRAMUSCULAR | Status: AC
  Filled 2019-08-31: qty 2

## 2019-08-31 SURGICAL SUPPLY — 60 items
ATTRACTOMAT 16X20 MAGNETIC DRP (DRAPES) IMPLANT
BAG DECANTER FOR FLEXI CONT (MISCELLANEOUS) IMPLANT
BENZOIN TINCTURE PRP APPL 2/3 (GAUZE/BANDAGES/DRESSINGS) ×4 IMPLANT
BLADE CLIPPER SURG (BLADE) IMPLANT
BNDG GAUZE ELAST 4 BULKY (GAUZE/BANDAGES/DRESSINGS) IMPLANT
CANISTER SUCT 3000ML PPV (MISCELLANEOUS) ×2 IMPLANT
CANISTER WOUNDNEG PRESSURE 500 (CANNISTER) ×2 IMPLANT
CATH THORACIC 28FR RT ANG (CATHETERS) IMPLANT
CATH THORACIC 36FR (CATHETERS) IMPLANT
CATH THORACIC 36FR RT ANG (CATHETERS) IMPLANT
CLIP VESOCCLUDE SM WIDE 24/CT (CLIP) IMPLANT
CONN Y 3/8X3/8X3/8  BEN (MISCELLANEOUS)
CONN Y 3/8X3/8X3/8 BEN (MISCELLANEOUS) IMPLANT
CONT SPEC 4OZ CLIKSEAL STRL BL (MISCELLANEOUS) ×2 IMPLANT
COVER WAND RF STERILE (DRAPES) IMPLANT
DRAPE LAPAROSCOPIC ABDOMINAL (DRAPES) ×2 IMPLANT
DRAPE WARM FLUID 44X44 (DRAPES) IMPLANT
DRSG AQUACEL AG ADV 3.5X14 (GAUZE/BANDAGES/DRESSINGS) IMPLANT
DRSG VAC ATS SM SENSATRAC (GAUZE/BANDAGES/DRESSINGS) ×2 IMPLANT
ELECT BLADE 4.0 EZ CLEAN MEGAD (MISCELLANEOUS) ×2
ELECT REM PT RETURN 9FT ADLT (ELECTROSURGICAL) ×2
ELECTRODE BLDE 4.0 EZ CLN MEGD (MISCELLANEOUS) ×1 IMPLANT
ELECTRODE REM PT RTRN 9FT ADLT (ELECTROSURGICAL) ×1 IMPLANT
GAUZE SPONGE 4X4 12PLY STRL (GAUZE/BANDAGES/DRESSINGS) IMPLANT
GAUZE XEROFORM 5X9 LF (GAUZE/BANDAGES/DRESSINGS) IMPLANT
GLOVE BIO SURGEON STRL SZ 6.5 (GLOVE) ×6 IMPLANT
GOWN STRL REUS W/ TWL LRG LVL3 (GOWN DISPOSABLE) ×4 IMPLANT
GOWN STRL REUS W/TWL LRG LVL3 (GOWN DISPOSABLE) ×4
HANDPIECE INTERPULSE COAX TIP (DISPOSABLE)
HEMOSTAT POWDER SURGIFOAM 1G (HEMOSTASIS) IMPLANT
HEMOSTAT SURGICEL 2X14 (HEMOSTASIS) IMPLANT
IV NS IRRIG 3000ML ARTHROMATIC (IV SOLUTION) ×2 IMPLANT
KIT BASIN OR (CUSTOM PROCEDURE TRAY) ×2 IMPLANT
KIT SUCTION CATH 14FR (SUCTIONS) IMPLANT
KIT TURNOVER KIT B (KITS) ×2 IMPLANT
MARKER SKIN DUAL TIP RULER LAB (MISCELLANEOUS) IMPLANT
NS IRRIG 1000ML POUR BTL (IV SOLUTION) ×2 IMPLANT
PACK CHEST (CUSTOM PROCEDURE TRAY) ×2 IMPLANT
SET HNDPC FAN SPRY TIP SCT (DISPOSABLE) IMPLANT
SOL PREP POV-IOD 4OZ 10% (MISCELLANEOUS) IMPLANT
SPONGE LAP 18X18 RF (DISPOSABLE) ×2 IMPLANT
STAPLER VISISTAT 35W (STAPLE) IMPLANT
SUT ETHILON 3 0 FSL (SUTURE) IMPLANT
SUT STEEL 6MS V (SUTURE) IMPLANT
SUT STEEL STERNAL CCS#1 18IN (SUTURE) IMPLANT
SUT STEEL SZ 6 DBL 3X14 BALL (SUTURE) IMPLANT
SUT VIC AB 1 CTX 36 (SUTURE)
SUT VIC AB 1 CTX36XBRD ANBCTR (SUTURE) IMPLANT
SUT VIC AB 2-0 CTX 27 (SUTURE) IMPLANT
SUT VIC AB 3-0 X1 27 (SUTURE) IMPLANT
SWAB COLLECTION DEVICE MRSA (MISCELLANEOUS) ×2 IMPLANT
SWAB CULTURE ESWAB REG 1ML (MISCELLANEOUS) ×2 IMPLANT
SYR 5ML LL (SYRINGE) IMPLANT
SYSTEM SAHARA CHEST DRAIN ATS (WOUND CARE) ×2 IMPLANT
TIP IRRIG INTERPULSE W/COAXIAL (MISCELLANEOUS) ×2 IMPLANT
TOWEL GREEN STERILE (TOWEL DISPOSABLE) ×2 IMPLANT
TOWEL GREEN STERILE FF (TOWEL DISPOSABLE) ×2 IMPLANT
TRAY FOLEY SLVR 14FR TEMP STAT (SET/KITS/TRAYS/PACK) IMPLANT
TUBE CONNECTING 20X1/4 (TUBING) ×2 IMPLANT
WATER STERILE IRR 1000ML POUR (IV SOLUTION) ×2 IMPLANT

## 2019-08-31 NOTE — Consult Note (Signed)
Reason for Consult:  Bilateral shoulder pain; history of bacteremia Consulting MD:  Starla Link, MD  Assessment/Plan: It seems that most of the patient's pain around his bilateral upper extremities involves the sternum more so than the shoulders.  Thus far, isolated shoulder exams bilaterally did not show any significant pain isolated to the shoulders and it seems to be referred from the sternum area.  The shoulders do not feel as if they are swollen and they are not red and are not in need of an aspiration right now.  I will follow him closely.  I did review the MRI of the sternum.  It is not had a final read by radiology yet, but it does appear to show significant signal changes around the left sternoclavicular joint concerning for infection.  This seems to correlate with his clinical exam as well.  There is some signal changes in the right sternoclavicular joint as well.  The primary team may need to consult CT surgery for an opinion as it relates to infection at the [facility] joints.  I will continue to follow closely as well.  Thus far, I do not see that an intervention is needed for his shoulders.  Indications/Exam: The patient is a 68 year old gentleman who is been recently admitted due to bacteremia.  He does report a history of an optic neuritis and then recently having some type of cellulitis or infection involving the leg.  He then presented to our office with severe back pain.  He had had previous surgery remotely by Dr. Sherwood Gambler.  With the worsening of the pain there became quite acute he then presented to the emergency room.  He was found to have a discitis.  He is also found to be bacteremic and was admitted to the medicine service.  He is being followed closely by infectious disease service.  He is on antibiotics.  I was asked to see him to assess his shoulders because he does report bilateral shoulder pain and weakness but it seems that most of his pain is around his chest and there is obvious swelling at  the left SI joint area.  He reports overall weakness of his upper and lower extremities.  On examination, neither shoulder has any effusion on inspection and palpation.  There is no fluctuance and no redness.  I can gently put both shoulders through internal and external rotation and that does not cause pain directly in the shoulders but it does radiate into the SI joint areas bilaterally.  There is quite significant pain to palpation and swelling over the sternoclavicular joint bilaterally but much worse on the left.  There is no significant pain in either elbow wrist or hand.  I can put both hips and knees through range of motion as well as ankles with no discomfort.

## 2019-08-31 NOTE — Progress Notes (Signed)
PT Cancellation Note  Patient Details Name: [name] [name]. MRN: [id number] DOB: [birth year]   Cancelled Treatment:    Reason Eval/Treat Not Completed: Medical issues which prohibited therapy- patient going to surgery. Follow up tomorrow.    Damika Harmon 08/31/2019, 1:34 PM

## 2019-08-31 NOTE — Progress Notes (Signed)
Patient back from surgery, pt is aware that his  wedding ring was send to [employer].

## 2019-08-31 NOTE — Progress Notes (Signed)
Patient performed a NIF of  >-40 and a VC of 2.5L with good effort.

## 2019-08-31 NOTE — Progress Notes (Signed)
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 Ortho has evaluated him and do not find any indications on exam concerning for infection of either shoulder and felt to be referred from infected  joints - agree; his shoulder tenderness and ROM, while slow, are better than yesterday's exam.   TTE unrevealing for vegetations with preserved EF and normal valve function.   For CNS penetration would either need to switch to Nafcillin or vancomycin to treat for meningitis concern, per neurology recommendations. We along with ID pharmacy team spent a long time discussing history of penicillin allergy as a child and determined him to be extremely low risk for severe reaction. He also tolerated zosyn in 2015 for intra-abdominal infection without concern for side effect. We have decided to administer an oral challenge with amoxicillin this afternoon. We discussed with his nurse today and updated his primary physician team as well.  Will change to nafcillin IV if he tolerates well.   Continue cefazolin for now. Will follow up blood cultures for clearance (neg @ 48h). Hold on PICC for now - would wait until CT surgery evaluates as he may require debridements of the area.    PLAN: 1. Continue Cefazolin  2. Hold on PICC line 3. CT surgery consulted (pending eval) 4. Amoxicillin 500 mg PO x 1 dose today at 13:00 5. Vital signs/observation every 15 minutes for 1 hour per protocol 6. Benadryl will be available should he have a reaction   Principal Problem:   MSSA bacteremia Active Problems:   Discitis of lumbar region   Hyperlipidemia   Obstructive sleep apnea   Type 2 diabetes mellitus with other circulatory complications (HCC)  Atrial fibrillation (HCC)   Myelopathy (HCC)   H/O optic neuritis   Left shoulder pain   Pain of left clavicle   . acetaminophen  325 mg Oral Once  . amoxicillin  500 mg Oral Once  . atorvastatin  80 mg Oral QHS  . brimonidine  1 drop Both Eyes TID  . dofetilide  500 mcg Oral BID  . insulin aspart  0-15 Units Subcutaneous TID WC  . insulin aspart  0-5 Units Subcutaneous QHS  . ipratropium-albuterol  3 mL Nebulization TID  . lidocaine (PF)  5 mL Intradermal Once  . loratadine  10 mg Oral Daily  . LORazepam  0.5 mg Intravenous Once  . pantoprazole  40 mg Oral Daily  . polyethylene glycol  17 g Oral BID  . senna-docusate  1 tablet Oral BID    SUBJECTIVE: Not much has changed for [name] [name] overnight. He still has bilateral shoulder pain/stiffness with raising arms with strait and lateral abduction. His anterior chest/left clavicle is still very tender and sore. He feels OK without moving too much. No fevers/chills.   Neurology team recommending coverage for meningitis since it is too risky to perform LP with risk for inoculating CNS with procedure.   Dr. [name] with orthopedics has evaluated him today for shoulder and clavicle/sternum pain; no concern for infection of either shoulder joint and felt to be referred pain from [facility] joint deformity/swelling. Hips/ankles and knees have full range of motion without any discomfort.   Afebrile. WBC  8/31 12.3K  Pre-treatment CRP 1.3 and ESR 8 (08/27/19)  BCx + 8/28 Repeated on 8/29 no growth at day 3 today    Review of Systems: Review of Systems  Constitutional: Positive for weight loss. Negative for chills and fever.  Respiratory: Negative for cough.   Cardiovascular: Positive for chest pain. Negative for leg swelling.  Gastrointestinal: Negative for abdominal pain and vomiting.  Genitourinary: Negative for dysuria.  Musculoskeletal: Positive for back pain and joint pain (L Datil joint and L Shoulder ).  Neurological: Positive for weakness.  Negative for headaches.    Allergies  Allergen Reactions  . Novocain [Procaine] Hives    Dentist appointment in 1968; since then has tolerated lidocaine and provocaine with no hives or difficulty.  . Penicillins     Childhood reaction-details unknown  . Pseudoephedrine Other (See Comments)    Patient went into afib  . Sulfonamide Derivatives     Childhood reaction   . Cardizem [Diltiazem] Rash    Also 2019  . Pseudoephedrine Hcl Palpitations    OBJECTIVE: Vitals:   08/31/19 0750 08/31/19 0751 08/31/19 0813 08/31/19 0840  BP:    118/69  Pulse:    82  Resp:    15  Temp:   97.8 F (36.6 C)   TempSrc:   Oral   SpO2: (!) 85% 93%  94%  Weight:      Height:       Body mass index is 34.29 kg/m.  Physical Exam Constitutional:      Comments: Uncomfortable. Resting in bed after PT/OT therapy.   HENT:     Mouth/Throat:     Mouth: Mucous membranes are moist.  Eyes:     General: No scleral icterus. Cardiovascular:     Rate and Rhythm: Normal rate and regular rhythm.     Heart sounds: No murmur.  Pulmonary:     Effort: Pulmonary effort is normal.     Breath sounds: Normal breath sounds.  Abdominal:     General: Bowel sounds are normal. There is no distension.  Musculoskeletal:       Arms:     Comments: + lower back pain   Skin:    General: Skin is warm and dry.     Capillary Refill: Capillary refill takes less than 2 seconds.  Neurological:     Mental Status: He is alert and oriented to person, place, and time.     Lab Results Lab Results  Component Value Date   WBC 12.3 (H) 08/30/2019   HGB 15.5 08/30/2019   HCT 45.5 08/30/2019   MCV 90.6 08/30/2019   PLT 76 (L) 08/30/2019    Lab Results  Component Value Date   CREATININE 0.87 08/30/2019   BUN 24 (H) 08/30/2019   NA 132 (L) 08/30/2019   K 4.3 08/30/2019   CL 100 08/30/2019   CO2 23 08/30/2019    Lab Results  Component Value Date   ALT 40 08/29/2019   AST 24 08/29/2019   ALKPHOS 92 08/29/2019    BILITOT 1.1 08/29/2019     Microbiology: Recent Results (from the past 240 hour(s))  Culture, blood (routine x 2)     Status: Abnormal   Collection Time: 08/27/19  8:54 AM   Specimen: BLOOD  Result Value Ref Range Status   Specimen Description BLOOD RIGHT ANTECUBITAL  Final   Special Requests   Final    BOTTLES DRAWN AEROBIC ONLY Blood Culture adequate volume   Culture  Setup Time  Final    GRAM POSITIVE COCCI IN CLUSTERS AEROBIC BOTTLE ONLY CRITICAL RESULT CALLED TO, READ BACK BY AND VERIFIED WITH: G. ABBOTT,PHARMD 0217 08/28/2019 T. TYSOR    Culture (A)  Final    STAPHYLOCOCCUS AUREUS SUSCEPTIBILITIES PERFORMED ON PREVIOUS CULTURE WITHIN THE LAST 5 DAYS. Performed at [facility], [place] [address] [place], [place] [address]    Report Status 08/29/2019 FINAL  Final  SARS CORONAVIRUS 2 (TAT 6-12 HRS) Nasal Swab Aptima Multi Swab     Status: None   Collection Time: 08/27/19  9:09 AM   Specimen: Aptima Multi Swab; Nasal Swab  Result Value Ref Range Status   SARS Coronavirus 2 NEGATIVE NEGATIVE Final    Comment: (NOTE) SARS-CoV-2 target nucleic acids are NOT DETECTED. The SARS-CoV-2 RNA is generally detectable in upper and lower respiratory specimens during the acute phase of infection. Negative results do not preclude SARS-CoV-2 infection, do not rule out co-infections with other pathogens, and should not be used as the sole basis for treatment or other patient management decisions. Negative results must be combined with clinical observations, patient history, and epidemiological information. The expected result is Negative. Fact Sheet for Patients: SugarRoll.be Fact Sheet for Healthcare Providers: https://www.woods-mathews.com/ This test is not yet approved or cleared by the [place] FDA and  has been authorized for detection and/or diagnosis of SARS-CoV-2 by FDA under an Emergency Use Authorization (EUA). This EUA will  remain  in effect (meaning this test can be used) for the duration of the COVID-19 declaration under Section 56 4(b)(1) of the Act, 21 U.S.C. section 360bbb-3(b)(1), unless the authorization is terminated or revoked sooner. Performed at [facility], [place] [address] [place], [place] [address]   Culture, blood (routine x 2)     Status: Abnormal   Collection Time: 08/27/19  9:51 AM   Specimen: BLOOD RIGHT ARM  Result Value Ref Range Status   Specimen Description BLOOD RIGHT ARM  Final   Special Requests   Final    BOTTLES DRAWN AEROBIC AND ANAEROBIC Blood Culture adequate volume   Culture  Setup Time   Final    GRAM POSITIVE COCCI IN CLUSTERS IN BOTH AEROBIC AND ANAEROBIC BOTTLES CRITICAL RESULT CALLED TO, READ BACK BY AND VERIFIED WITH: G. ABBOTT,PHARMD 0217 08/28/2019 Mena Goes Performed at [facility], [place] [address], [place], [place] [address]    Culture STAPHYLOCOCCUS AUREUS (A)  Final   Report Status 08/29/2019 FINAL  Final   Organism ID, Bacteria STAPHYLOCOCCUS AUREUS  Final      Susceptibility   Staphylococcus aureus - MIC*    CIPROFLOXACIN <=0.5 SENSITIVE Sensitive     ERYTHROMYCIN <=0.25 SENSITIVE Sensitive     GENTAMICIN <=0.5 SENSITIVE Sensitive     OXACILLIN <=0.25 SENSITIVE Sensitive     TETRACYCLINE <=1 SENSITIVE Sensitive     VANCOMYCIN <=0.5 SENSITIVE Sensitive     TRIMETH/SULFA <=10 SENSITIVE Sensitive     CLINDAMYCIN <=0.25 SENSITIVE Sensitive     RIFAMPIN <=0.5 SENSITIVE Sensitive     Inducible Clindamycin NEGATIVE Sensitive     * STAPHYLOCOCCUS AUREUS  Blood Culture ID Panel (Reflexed)     Status: Abnormal   Collection Time: 08/27/19  9:51 AM  Result Value Ref Range Status   Enterococcus species NOT DETECTED NOT DETECTED Final   Listeria monocytogenes NOT DETECTED NOT DETECTED Final   Staphylococcus species DETECTED (A) NOT DETECTED Final    Comment: CRITICAL RESULT CALLED TO, READ BACK BY AND VERIFIED WITH: G.  ABBOTT,PHARMD 0217 08/28/2019  T. TYSOR    Staphylococcus aureus (BCID) DETECTED (A) NOT DETECTED Final    Comment: Methicillin (oxacillin) susceptible Staphylococcus aureus (MSSA). Preferred therapy is anti staphylococcal beta lactam antibiotic (Cefazolin or Nafcillin), unless clinically contraindicated. CRITICAL RESULT CALLED TO, READ BACK BY AND VERIFIED WITH: G. ABBOTT,PHARMD 0217 08/28/2019 T. TYSOR    Methicillin resistance NOT DETECTED NOT DETECTED Final   Streptococcus species NOT DETECTED NOT DETECTED Final   Streptococcus agalactiae NOT DETECTED NOT DETECTED Final   Streptococcus pneumoniae NOT DETECTED NOT DETECTED Final   Streptococcus pyogenes NOT DETECTED NOT DETECTED Final   Acinetobacter baumannii NOT DETECTED NOT DETECTED Final   Enterobacteriaceae species NOT DETECTED NOT DETECTED Final   Enterobacter cloacae complex NOT DETECTED NOT DETECTED Final   Escherichia coli NOT DETECTED NOT DETECTED Final   Klebsiella oxytoca NOT DETECTED NOT DETECTED Final   Klebsiella pneumoniae NOT DETECTED NOT DETECTED Final   Proteus species NOT DETECTED NOT DETECTED Final   Serratia marcescens NOT DETECTED NOT DETECTED Final   Haemophilus influenzae NOT DETECTED NOT DETECTED Final   Neisseria meningitidis NOT DETECTED NOT DETECTED Final   Pseudomonas aeruginosa NOT DETECTED NOT DETECTED Final   Candida albicans NOT DETECTED NOT DETECTED Final   Candida glabrata NOT DETECTED NOT DETECTED Final   Candida krusei NOT DETECTED NOT DETECTED Final   Candida parapsilosis NOT DETECTED NOT DETECTED Final   Candida tropicalis NOT DETECTED NOT DETECTED Final    Comment: Performed at [facility], [place]. [address] [place], [place] [address]  MRSA PCR Screening     Status: None   Collection Time: 08/28/19  7:54 AM   Specimen: Nasal Mucosa; Nasopharyngeal  Result Value Ref Range Status   MRSA by PCR NEGATIVE NEGATIVE Final    Comment:        The GeneXpert MRSA Assay (FDA approved for NASAL specimens only), is one  component of a comprehensive MRSA colonization surveillance program. It is not intended to diagnose MRSA infection nor to guide or monitor treatment for MRSA infections. Performed at [facility], [place] [address] [place], [place] [address]   Culture, blood (routine x 2)     Status: None (Preliminary result)   Collection Time: 08/29/19  5:48 AM   Specimen: BLOOD  Result Value Ref Range Status   Specimen Description BLOOD RIGHT HAND  Final   Special Requests   Final    BOTTLES DRAWN AEROBIC ONLY Blood Culture adequate volume   Culture   Final    NO GROWTH 2 DAYS Performed at [facility], [place] [address] [place], [place] [address]    Report Status PENDING  Incomplete  Culture, blood (routine x 2)     Status: None (Preliminary result)   Collection Time: 08/29/19  5:51 AM   Specimen: BLOOD  Result Value Ref Range Status   Specimen Description BLOOD LEFT ARM  Final   Special Requests   Final    BOTTLES DRAWN AEROBIC AND ANAEROBIC Blood Culture adequate volume   Culture   Final    NO GROWTH 2 DAYS Performed at [facility], [place] [address], [place], [place] [address]    Report Status PENDING  Incomplete    [name], MSN, NP-C [facility] [email or web address] Pager: [phone] Office: [phone] [place]: [phone]

## 2019-08-31 NOTE — Progress Notes (Signed)
Dr. Smith  notified that patient ate 1 piece of sausage and 0.5 of a blueberry muffin at 0830.

## 2019-08-31 NOTE — Progress Notes (Signed)
Penicillin Allergy and Skin Testing Assessment  Mr. Toren was noted to have a penicillin allergy from childhood listed in his chart. A  penicillin allergy assessment was performed by myself and [name], NP.   Patient history of penicillin or beta lactam allergy: Mr. Ellwanger reports a poison ivy like rash after receiving penicillin when he was 4 or 5. He did go to his childhood physician at the time but was not admitted to the hospital. He does not remember having any shortness of breath or swelling reaction.   History of Beta-lactam Medications: Mr. Hocevar has tolerated cephalosporins multiple times in the past. He also tolerated Zosyn in 2015 when he had an appendectomy at [facility]. No reactions were documented in his chart at this time and he does not recall having any rash or other reaction to the Zosyn at that time.     Mr. Knoth allergy history was discussed with him and detail. We explained that it is likely that his immune system responds differently to penicillins since it has been over 60 years since his reaction. He is agreeable with trying an oral challenge of amoxicillin this afternoon and beginning IV nafcillin if no reaction occurs.    Plan - Amoxicillin 500 mg X 1 at 1300 - Monitor vital signs every 15 minutes for 1 hour at 1300 (Discussed with RN Jacqulyn Bath) - If no reaction, remove penicillin allergy from chart and begin IV naficillin - PharmD will call patient's primary pharmacy and physician office to follow-up on allergy after oral challenge  Jimmy Footman, PharmD, BCPS, BCIDP Infectious Diseases Clinical Pharmacist Phone: [phone] 08/31/2019 11:29 AM

## 2019-08-31 NOTE — Progress Notes (Signed)
Patient ID: [name] Chizmar., male   DOB: November 24, 1951, 68 y.o.   MRN: [id number]  PROGRESS NOTE    [name] Chimes.  [id number] DOB: August 12, 1951 DOA: 08/27/2019 PCP: Burnis Medin, MD   Brief Narrative:  68 year old male with history of OSA on CPAP, perforated appendicitis in 2015, CAD status post stents, hyperlipidemia, diabetes mellitus type 2, optic neuritis with recent hospitalization at [facility] treated with intravenous and subsequent oral steroids, recent right lower extremity cellulitis treated as an outpatient with oral doxycycline, A. fib on Pradaxa presented with back pain and progressively worsening difficulty walking.  Neurology was consulted.  He was found to have MSSA bacteremia with question of L4-L5 discitis/osteomyelitis.  He was started on Ancef.  ID was consulted.  Assessment & Plan:   Diffuse weakness/pain/myelopathy with history of optic neuritis/nonarteritic ischemic optic neuropathy -Neurology following.  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  Spoke to Dr. Lindzen/neurology on 08/28/2019: He thinks that patient might have L4-L5 discitis/osteomyelitis which would explain the bacteremia and fever. -MRI of the cervical and thoracic spine showed degenerative changes without cord signal abnormality -MRI of the brain showed question of punctate acute infarctions.  Dr. Cheral Marker is not convinced about this for now.  CTA of the head and neck showed no evidence of CNS vasculitis  echo pending. -Follow further neurology recommendations.  PT/OT eval. -Trial of Robaxin as per neurology was done. -LP has been deferred as patient already has MSSA bacteremia with discitis and Ancef would be appropriate for treatment for MSSA meningitis if this is meningitis. -Patient also has bilateral shoulder pain.  Orthopedics/Dr. [name] has been consulted.  MSSA bacteremia Question of L4-L5 discitis/osteomyelitis -Patient was recently  treated for cellulitis with doxycycline as an outpatient.  No current evidence of cellulitis.  Bacteremia might be secondary to discitis/osteomyelitis.  Continue Ancef.  ID evaluation appreciated.  Repeat blood cultures from 08/29/2019 have been negative so far.  Echo showed no valvular vegetations.  Spoke to neurosurgery/Dr. Annette Stable on phone on 08/28/2019 and he reviewed the MRI lumbar spine and recommended no surgical intervention at this time. -COVID-19 testing negative  Leukocytosis -No labs for today.  Monitor.   Thrombocytopenia -Questionable cause.  Monitor.  No signs of bleeding  Dehydration Acute kidney injury -Creatinine improving.  IV fluids have been discontinued.  Paroxysmal A. fib on Pradaxa -Continue Tikosyn.  Check magnesium for today. -Resume Pradaxa as there is no plan for LP for now  Diabetes mellitus type 2 -A1c 6.7 on 07/21/2019 -Glucophage held -Continue CBGs with SSI  Hyperlipidemia -Continue Lipitor  OSA -Continue CPAP at bedside  Obesity -Outpatient follow-up  Generalized deconditioning/weakness-might need CIR placement.  DVT prophylaxis: SCDs Code Status: Full Family Communication: spoke to wife/[name] [name] on phone on 08/30/2019  disposition Plan: Might need CIR placement  Consultants: Neurology/ID/IR.  Spoke to neurosurgery/Dr. Annette Stable on 08/28/2019.  Procedures:  Echo IMPRESSIONS    1. The left ventricle has normal systolic function with an ejection fraction of 60-65%. The cavity size was normal. There is mildly increased left ventricular wall thickness. Left ventricular diastolic Doppler parameters are consistent with impaired  relaxation. Indeterminate filling pressures The E/e' is 8-15. No evidence of left ventricular regional wall motion abnormalities.  2. The right ventricle has normal systolic function. The cavity was normal. There is no increase in right ventricular wall thickness.  3. Left atrial size was mildly dilated.  4. The mitral valve is  abnormal. Mild thickening of the  mitral valve leaflet. No mitral valve vegetation visualized.  5. The tricuspid valve is grossly normal.  6. No vegetation on the aortic valve.  7. The aortic valve is tricuspid. Mild sclerosis of the aortic valve. Aortic valve regurgitation is trivial by color flow Doppler. No stenosis of the aortic valve.  8. The aorta is normal unless otherwise noted.  SUMMARY   LVEF 60-65%, mild LVH, normal wall motion, grade 1 DD, indeterminate LV filling pressure, normal RV function, mild LAE, aortic valve sclerosis with trivial AI, no valvular vegetations  Antimicrobials: Ancef from 08/27/2019 onwards   Subjective: Patient seen and examined at bedside.  Patient is a poor historian.  Denies any worsening back pain.  Complains of left shoulder pain.  Still feels weak.  No overnight fever or vomiting.   Objective: Vitals:   08/31/19 0014 08/31/19 0346 08/31/19 0446 08/31/19 0503  BP: 121/77   108/70  Pulse: 81 85 83 85  Resp: 15 (!) 24 17 20   Temp: 98.3 F (36.8 C)   (!) 97.5 F (36.4 C)  TempSrc: Oral   Oral  SpO2: 94% (!) 89% 91% 90%  Weight:      Height:        Intake/Output Summary (Last 24 hours) at 08/31/2019 0747 Last data filed at 08/31/2019 0510 Gross per 24 hour  Intake 626.06 ml  Output 1650 ml  Net -1023.94 ml   Filed Weights   08/29/19 0500 08/30/19 0416  Weight: 118 kg 117.9 kg    Examination:  General exam: No acute distress .  Still slow to respond and a poor historian currently. Respiratory system: Bilateral decreased breath sounds at bases with some scattered crackles.   Cardiovascular system: S1-S2 heard, rate controlled gastrointestinal system: Abdomen is nondistended, soft and nontender. Normal bowel sounds heard. Extremities: No cyanosis; trace edema.  Left shoulder and shoulder tenderness  Data Reviewed: I have personally reviewed following labs and imaging studies  CBC: Recent Labs  Lab 08/27/19 0908 08/28/19 0415  08/29/19 0548 08/30/19 0347  WBC 12.0* 15.8* 12.9* 12.3*  NEUTROABS 9.8*  --  9.6* 8.6*  HGB 19.4* 17.9* 16.0 15.5  HCT 56.4* 51.2 46.7 45.5  MCV 89.7 89.5 90.2 90.6  PLT 103* 91* 73* 76*   Basic Metabolic Panel: Recent Labs  Lab 08/27/19 0908 08/28/19 0415 08/29/19 0548 08/30/19 0347  NA 135 131* 134* 132*  K 4.3 4.4 4.0 4.3  CL 99 97* 100 100  CO2 21* 21* 24 23  GLUCOSE 173* 230* 158* 141*  BUN 36* 37* 30* 24*  CREATININE 0.97 1.48* 0.97 0.87  CALCIUM 10.1 9.3 9.2 9.0  MG  --   --  2.0 1.8   GFR: Estimated Creatinine Clearance: 107.8 mL/min (by C-G formula based on SCr of 0.87 mg/dL). Liver Function Tests: Recent Labs  Lab 08/27/19 0908 08/28/19 0415 08/29/19 0548  AST 20 22 24   ALT 48* 40 40  ALKPHOS 103 112 92  BILITOT 1.7* 1.8* 1.1  PROT 7.5 6.1* 5.4*  ALBUMIN 3.5 2.9* 2.4*   No results for input(s): LIPASE, AMYLASE in the last 168 hours. No results for input(s): AMMONIA in the last 168 hours. Coagulation Profile: Recent Labs  Lab 08/27/19 1531  INR 1.1   Cardiac Enzymes: Recent Labs  Lab 08/27/19 1705  CKTOTAL 30*   BNP (last 3 results) No results for input(s): PROBNP in the last 8760 hours. HbA1C: No results for input(s): HGBA1C in the last 72 hours. CBG: Recent Labs  Lab 08/29/19  1648 08/30/19 0805 08/30/19 1235 08/30/19 1655 08/30/19 2108  GLUCAP 146* 146* 148* 147* 129*   Lipid Profile: No results for input(s): CHOL, HDL, LDLCALC, TRIG, CHOLHDL, LDLDIRECT in the last 72 hours. Thyroid Function Tests: No results for input(s): TSH, T4TOTAL, FREET4, T3FREE, THYROIDAB in the last 72 hours. Anemia Panel: No results for input(s): VITAMINB12, FOLATE, FERRITIN, TIBC, IRON, RETICCTPCT in the last 72 hours. Sepsis Labs: No results for input(s): PROCALCITON, LATICACIDVEN in the last 168 hours.  Recent Results (from the past 240 hour(s))  Culture, blood (routine x 2)     Status: Abnormal   Collection Time: 08/27/19  8:54 AM   Specimen:  BLOOD  Result Value Ref Range Status   Specimen Description BLOOD RIGHT ANTECUBITAL  Final   Special Requests   Final    BOTTLES DRAWN AEROBIC ONLY Blood Culture adequate volume   Culture  Setup Time   Final    GRAM POSITIVE COCCI IN CLUSTERS AEROBIC BOTTLE ONLY CRITICAL RESULT CALLED TO, READ BACK BY AND VERIFIED WITH: G. ABBOTT,PHARMD 0217 08/28/2019 T. TYSOR    Culture (A)  Final    STAPHYLOCOCCUS AUREUS SUSCEPTIBILITIES PERFORMED ON PREVIOUS CULTURE WITHIN THE LAST 5 DAYS. Performed at [facility], [place] [address] [place], [place] [address]    Report Status 08/29/2019 FINAL  Final  SARS CORONAVIRUS 2 (TAT 6-12 HRS) Nasal Swab Aptima Multi Swab     Status: None   Collection Time: 08/27/19  9:09 AM   Specimen: Aptima Multi Swab; Nasal Swab  Result Value Ref Range Status   SARS Coronavirus 2 NEGATIVE NEGATIVE Final    Comment: (NOTE) SARS-CoV-2 target nucleic acids are NOT DETECTED. The SARS-CoV-2 RNA is generally detectable in upper and lower respiratory specimens during the acute phase of infection. Negative results do not preclude SARS-CoV-2 infection, do not rule out co-infections with other pathogens, and should not be used as the sole basis for treatment or other patient management decisions. Negative results must be combined with clinical observations, patient history, and epidemiological information. The expected result is Negative. Fact Sheet for Patients: SugarRoll.be Fact Sheet for Healthcare Providers: https://www.woods-mathews.com/ This test is not yet approved or cleared by the [place] FDA and  has been authorized for detection and/or diagnosis of SARS-CoV-2 by FDA under an Emergency Use Authorization (EUA). This EUA will remain  in effect (meaning this test can be used) for the duration of the COVID-19 declaration under Section 56 4(b)(1) of the Act, 21 U.S.C. section 360bbb-3(b)(1), unless the authorization  is terminated or revoked sooner. Performed at [facility], [place] [address] [place], [place] [address]   Culture, blood (routine x 2)     Status: Abnormal   Collection Time: 08/27/19  9:51 AM   Specimen: BLOOD RIGHT ARM  Result Value Ref Range Status   Specimen Description BLOOD RIGHT ARM  Final   Special Requests   Final    BOTTLES DRAWN AEROBIC AND ANAEROBIC Blood Culture adequate volume   Culture  Setup Time   Final    GRAM POSITIVE COCCI IN CLUSTERS IN BOTH AEROBIC AND ANAEROBIC BOTTLES CRITICAL RESULT CALLED TO, READ BACK BY AND VERIFIED WITH: G. ABBOTT,PHARMD 0217 08/28/2019 Mena Goes Performed at [facility], [place] [address], [place], [place] [address]    Culture STAPHYLOCOCCUS AUREUS (A)  Final   Report Status 08/29/2019 FINAL  Final   Organism ID, Bacteria STAPHYLOCOCCUS AUREUS  Final      Susceptibility   Staphylococcus aureus -  MIC*    CIPROFLOXACIN <=0.5 SENSITIVE Sensitive     ERYTHROMYCIN <=0.25 SENSITIVE Sensitive     GENTAMICIN <=0.5 SENSITIVE Sensitive     OXACILLIN <=0.25 SENSITIVE Sensitive     TETRACYCLINE <=1 SENSITIVE Sensitive     VANCOMYCIN <=0.5 SENSITIVE Sensitive     TRIMETH/SULFA <=10 SENSITIVE Sensitive     CLINDAMYCIN <=0.25 SENSITIVE Sensitive     RIFAMPIN <=0.5 SENSITIVE Sensitive     Inducible Clindamycin NEGATIVE Sensitive     * STAPHYLOCOCCUS AUREUS  Blood Culture ID Panel (Reflexed)     Status: Abnormal   Collection Time: 08/27/19  9:51 AM  Result Value Ref Range Status   Enterococcus species NOT DETECTED NOT DETECTED Final   Listeria monocytogenes NOT DETECTED NOT DETECTED Final   Staphylococcus species DETECTED (A) NOT DETECTED Final    Comment: CRITICAL RESULT CALLED TO, READ BACK BY AND VERIFIED WITH: G. ABBOTT,PHARMD 0217 08/28/2019 T. TYSOR    Staphylococcus aureus (BCID) DETECTED (A) NOT DETECTED Final    Comment: Methicillin (oxacillin) susceptible Staphylococcus aureus (MSSA). Preferred therapy is anti staphylococcal  beta lactam antibiotic (Cefazolin or Nafcillin), unless clinically contraindicated. CRITICAL RESULT CALLED TO, READ BACK BY AND VERIFIED WITH: G. ABBOTT,PHARMD 0217 08/28/2019 T. TYSOR    Methicillin resistance NOT DETECTED NOT DETECTED Final   Streptococcus species NOT DETECTED NOT DETECTED Final   Streptococcus agalactiae NOT DETECTED NOT DETECTED Final   Streptococcus pneumoniae NOT DETECTED NOT DETECTED Final   Streptococcus pyogenes NOT DETECTED NOT DETECTED Final   Acinetobacter baumannii NOT DETECTED NOT DETECTED Final   Enterobacteriaceae species NOT DETECTED NOT DETECTED Final   Enterobacter cloacae complex NOT DETECTED NOT DETECTED Final   Escherichia coli NOT DETECTED NOT DETECTED Final   Klebsiella oxytoca NOT DETECTED NOT DETECTED Final   Klebsiella pneumoniae NOT DETECTED NOT DETECTED Final   Proteus species NOT DETECTED NOT DETECTED Final   Serratia marcescens NOT DETECTED NOT DETECTED Final   Haemophilus influenzae NOT DETECTED NOT DETECTED Final   Neisseria meningitidis NOT DETECTED NOT DETECTED Final   Pseudomonas aeruginosa NOT DETECTED NOT DETECTED Final   Candida albicans NOT DETECTED NOT DETECTED Final   Candida glabrata NOT DETECTED NOT DETECTED Final   Candida krusei NOT DETECTED NOT DETECTED Final   Candida parapsilosis NOT DETECTED NOT DETECTED Final   Candida tropicalis NOT DETECTED NOT DETECTED Final    Comment: Performed at [facility], [place]. [address] [place], [place] [address]  MRSA PCR Screening     Status: None   Collection Time: 08/28/19  7:54 AM   Specimen: Nasal Mucosa; Nasopharyngeal  Result Value Ref Range Status   MRSA by PCR NEGATIVE NEGATIVE Final    Comment:        The GeneXpert MRSA Assay (FDA approved for NASAL specimens only), is one component of a comprehensive MRSA colonization surveillance program. It is not intended to diagnose MRSA infection nor to guide or monitor treatment for MRSA infections. Performed at [facility], [place] [address] [place], [place] [address]   Culture, blood (routine x 2)     Status: None (Preliminary result)   Collection Time: 08/29/19  5:48 AM   Specimen: BLOOD  Result Value Ref Range Status   Specimen Description BLOOD RIGHT HAND  Final   Special Requests   Final    BOTTLES DRAWN AEROBIC ONLY Blood Culture adequate volume   Culture   Final    NO GROWTH 2 DAYS Performed at [facility], [address], [place], [place] [address]    Report Status PENDING  Incomplete  Culture, blood (routine x 2)     Status: None (Preliminary result)   Collection Time: 08/29/19  5:51 AM   Specimen: BLOOD  Result Value Ref Range Status   Specimen Description BLOOD LEFT ARM  Final   Special Requests   Final    BOTTLES DRAWN AEROBIC AND ANAEROBIC Blood Culture adequate volume   Culture   Final    NO GROWTH 2 DAYS Performed at [facility], 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 EXAM: CT ANGIOGRAPHY HEAD AND NECK TECHNIQUE: Multidetector CT imaging of the head and neck was performed using the standard protocol during bolus administration of intravenous contrast. Multiplanar CT image reconstructions and MIPs were obtained to evaluate the vascular anatomy. Carotid stenosis measurements (when applicable) are obtained utilizing NASCET criteria, using the distal internal carotid diameter as the denominator. CONTRAST:  48mL OMNIPAQUE IOHEXOL 350 MG/ML SOLN COMPARISON:  MRI yesterday. FINDINGS: CT HEAD FINDINGS Brain: Minimal chronic small-vessel changes of the white matter. No sign of acute infarction, mass lesion, hemorrhage, hydrocephalus or extra-axial collection. Vascular: Negative Skull: Normal Sinuses: Clear. Right posterior nasopharyngeal  polyp or mass again seen. Orbits: Normal Review of the MIP images confirms the above findings CTA NECK FINDINGS Aortic arch: Aortic atherosclerosis. Ectatic ascending aorta with maximal diameter of 4.2 cm. Right carotid system: Common carotid artery widely patent to the bifurcation. Calcified plaque at the carotid bifurcation and ICA bulb. No stenosis. Cervical ICA widely patent beyond that. Left carotid system: Common carotid artery widely patent to the bifurcation. Calcified plaque at the ICA bulb but no stenosis. Cervical ICA normal beyond that. Vertebral arteries: Atherosclerotic plaque at the proximal right subclavian artery but no stenosis. Right vertebral artery origin widely patent. Right vertebral artery widely patent through the cervical region to the foramen magnum. Atherosclerotic disease at the left subclavian artery origin with 50% stenosis. Left vertebral artery origin widely patent. Non dominant left vertebral artery widely patent through the cervical region to the foramen magnum. Skeleton: Ordinary cervical spondylosis. Other neck: Right posterior nasopharyngeal polyp or mass as previously noted. No other neck finding. Upper chest: Scarring and emphysema in the upper lobes. Review of the MIP images confirms the above findings CTA HEAD FINDINGS Anterior circulation: Both internal carotid arteries are patent through the skull base and siphon regions. Ordinary siphon atherosclerotic calcification without stenosis. The anterior and middle cerebral vessels are normal without proximal stenosis, aneurysm or vascular malformation. No large or medium vessel occlusion. No sign of gross distal vessel irregularity. Posterior circulation: Both vertebral arteries are patent at the foramen magnum level. There is atherosclerotic calcification in the right V4 segment with stenosis of 30-50%. Small left vertebral artery terminates in PICA. Basilar artery is tortuous but does not show focal stenosis. Superior  cerebellar and posterior cerebral arteries are patent. Posterior cerebral arteries receive most of there supply from patent posterior communicating arteries. No distal vessel irregularity is appreciable. Venous sinuses: Patent and normal. Anatomic variants: None significant otherwise. Review of the MIP images confirms the above findings IMPRESSION: No gross evidence of CSF vasculitis. Nonstenotic atherosclerotic change at both carotid bifurcations. 50% stenosis of the subclavian artery  origin on the left. 30-50% stenosis of the right vertebral artery V4 segment. No posterior circulation large or medium vessel occlusion. Posterior cerebral arteries receive most of there supply from the anterior circulation. Ectatic ascending aorta, diameter 4.2 cm. Recommend annual imaging followup by CTA or MRA. This recommendation follows 2010 ACCF/AHA/AATS/ACR/ASA/SCA/SCAI/SIR/STS/SVM Guidelines for the Diagnosis and Management of Patients with Thoracic Aortic Disease. Circulation. 2010; [id number]. Aortic aneurysm NOS (ICD10-I71.9) Electronically Signed   By: Nelson Chimes M.D.   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 EXAM: CT ANGIOGRAPHY HEAD AND NECK TECHNIQUE: Multidetector CT imaging of the head and neck was performed using the standard protocol during bolus administration of intravenous contrast. Multiplanar CT image reconstructions and MIPs were obtained to evaluate the vascular anatomy. Carotid stenosis measurements (when applicable) are obtained utilizing NASCET criteria, using the distal internal carotid diameter as the denominator. CONTRAST:  33mL OMNIPAQUE IOHEXOL 350 MG/ML SOLN COMPARISON:  MRI yesterday. FINDINGS: CT HEAD FINDINGS Brain: Minimal chronic small-vessel changes of the white matter. No sign of acute infarction, mass lesion, hemorrhage,  hydrocephalus or extra-axial collection. Vascular: Negative Skull: Normal Sinuses: Clear. Right posterior nasopharyngeal polyp or mass again seen. Orbits: Normal Review of the MIP images confirms the above findings CTA NECK FINDINGS Aortic arch: Aortic atherosclerosis. Ectatic ascending aorta with maximal diameter of 4.2 cm. Right carotid system: Common carotid artery widely patent to the bifurcation. Calcified plaque at the carotid bifurcation and ICA bulb. No stenosis. Cervical ICA widely patent beyond that. Left carotid system: Common carotid artery widely patent to the bifurcation. Calcified plaque at the ICA bulb but no stenosis. Cervical ICA normal beyond that. Vertebral arteries: Atherosclerotic plaque at the proximal right subclavian artery but no stenosis. Right vertebral artery origin widely patent. Right vertebral artery widely patent through the cervical region to the foramen magnum. Atherosclerotic disease at the left subclavian artery origin with 50% stenosis. Left vertebral artery origin widely patent. Non dominant left vertebral artery widely patent through the cervical region to the foramen magnum. Skeleton: Ordinary cervical spondylosis. Other neck: Right posterior nasopharyngeal polyp or mass as previously noted. No other neck finding. Upper chest: Scarring and emphysema in the upper lobes. Review of the MIP images confirms the above findings CTA HEAD FINDINGS Anterior circulation: Both internal carotid arteries are patent through the skull base and siphon regions. Ordinary siphon atherosclerotic calcification without stenosis. The anterior and middle cerebral vessels are normal without proximal stenosis, aneurysm or vascular malformation. No large or medium vessel occlusion. No sign of gross distal vessel irregularity. Posterior circulation: Both vertebral arteries are patent at the foramen magnum level. There is atherosclerotic calcification in the right V4 segment with stenosis of 30-50%. Small  left vertebral artery terminates in PICA. Basilar artery is tortuous but does not show focal stenosis. Superior cerebellar and posterior cerebral arteries are patent. Posterior cerebral arteries receive most of there supply from patent posterior communicating arteries. No distal vessel irregularity is appreciable. Venous sinuses: Patent and normal. Anatomic variants: None significant otherwise. Review of the MIP images confirms the above findings IMPRESSION: No gross evidence of CSF vasculitis. Nonstenotic atherosclerotic change at both carotid bifurcations. 50% stenosis of the subclavian artery origin on the left. 30-50% stenosis of the right vertebral artery V4 segment. No posterior circulation large or medium vessel occlusion. Posterior cerebral arteries receive most of there supply from  the anterior circulation. Ectatic ascending aorta, diameter 4.2 cm. Recommend annual imaging followup by CTA or MRA. This recommendation follows 2010 ACCF/AHA/AATS/ACR/ASA/SCA/SCAI/SIR/STS/SVM Guidelines for the Diagnosis and Management of Patients with Thoracic Aortic Disease. Circulation. 2010; [id number]. Aortic aneurysm NOS (ICD10-I71.9) Electronically Signed   By: Nelson Chimes M.D.   On: 08/29/2019 12:01        Scheduled Meds: . acetaminophen  325 mg Oral Once  . atorvastatin  80 mg Oral QHS  . brimonidine  1 drop Both Eyes TID  . dofetilide  500 mcg Oral BID  . insulin aspart  0-15 Units Subcutaneous TID WC  . insulin aspart  0-5 Units Subcutaneous QHS  . ipratropium-albuterol  3 mL Nebulization TID  . lidocaine (PF)  5 mL Intradermal Once  . loratadine  10 mg Oral Daily  . pantoprazole  40 mg Oral Daily  . polyethylene glycol  17 g Oral BID  . senna-docusate  1 tablet Oral BID   Continuous Infusions: .  ceFAZolin (ANCEF) IV 2 g (08/31/19 0553)     LOS: 4 days        Aline August, MD [facility] 08/31/2019, 7:47 AM

## 2019-08-31 NOTE — Consult Note (Addendum)
[address]       [place] [address]             [phone]        [name] [name]. [place] Record [id number] Date of Birth: 1951-11-19  Referring: Aline August, MD Primary Care: Burnis Medin, MD Primary Cardiologist:No primary care provider on file.  Chief Complaint:   Back and shoulder pain  History of Present Illness:    [name] [name] is a 68 year old male with past history of type 2 diabetes mellitus, dyslipidemia, paroxysmal atrial fibrillation treated with Pradaxa and Tikosyn, lumbar discitis, spinal stenosis with chronic back pain, and history of coronary artery disease treated with stenting of the circumflex coronary artery in December 2003.  He was recently treated for left optic nerve neuritis with steroids earlier this month.  On 08/19/2019, he was seen in the emergency room with back pain.  During that visit he was discovered to have a 4.1 cm thoracic aortic aneurysm that was felt to be stable, nonacute, and in need of no intervention.  He presented to the emergency room again on 08/27/2019 complaining of severe low back pain with bilateral shoulder pain radiating into the deltoids.  He reported he had such high level of pain over the preceding 2 weeks that he had been unable to prepare food for himself and had lost about 12 pounds.  He was admitted to the hospital for management of pain that had failed outpatient therapy.  The neurology service was consulted.  MRI of the spine was recommended and was carried out shortly after admission.  There was radiographic evidence of discitis at the L4-L5 region that possibly represented osteomyelitis.  The thoracic and cervical spine studies showed degenerative changes.  Should be noted that shortly after admission, the patient spiked a fever to 102 F.  Blood cultures were obtained at the time and grew methicillin sensitive staph aureus.  Patient was started on IV antibiotic therapy.  Infectious disease  service was consulted.  Further work-up included an MRI of the brain that initially was thought to show several small acute microinfarcts but after review by the neurologist these were determined to most likely represent artifact.  An echocardiogram showed ejection fraction of 60 to 65% with no valvular vegetations.  On physical assessment yesterday by the infectious disease service, the patient was noted to have significant left shoulder and left sternoclavicular joint tenderness with associated swelling at the [facility] joint.  An MRI of this region was obtained and confirmed cortical irregularity in the left distal clavicle and within the sternoclavicular joint and an associated fluid collection. .  This is now felt to be most likely source of the bacteremia.  On further questioning today, affirms that this pain over the [facility] joint is most representative of pain he was having at the time of admission.  Chart review shows initial leukocytosis with a white count of 15,800 as declined to 10,700 by this morning.  His creatinine has improved from 1.48-0.87.  Patient has chronic thrombocytopenia.  His platelet count today is 88,000 and is stable.  CT surgery has been asked to evaluate [name] [name] for urgent surgical exploration of the sternoclavicular joint with drainage of the abscess and debridement as required.  [name] [name] reports he ate breakfast consisting of a blueberry muffin and a piece of sausage between 8 and 9 AM this morning.    Current Activity/ Functional Status:   Zubrod Score: At the  time of surgery this patient's most appropriate activity status/level should be described as: []     0    Normal activity, no symptoms [x]     1    Restricted in physical strenuous activity but ambulatory, able to do out light work []     2    Ambulatory and capable of self care, unable to do work activities, up and about                 more than 50%  Of the time                            []     3    Only limited self  care, in bed greater than 50% of waking hours []     4    Completely disabled, no self care, confined to bed or chair []     5    Moribund  Past Medical History:  Diagnosis Date  . Atrial fibrillation ([place]) 03/22/2009   a. s/p multiple DCCV; b. no coumadin due to low TE risk profile; c. Tikosyn Rx  . Coronary atherosclerosis of native coronary artery 11/2002   a. s/p stent to LAD 12/03; OM2 occluded at cath 12/03; d. myoview 5/10: no ischemia;  e. echo 7/11: EF 55%, BAE, mild RVE, PASP 41-45; Myoview was in March 2013. There was no ischemia or infarction, EF 51%   . Cutaneous abscess of back excluding buttocks 07/04/2014   Appears to stem from possibly a cyst very large area 6 cm contact surgeon office   . Diabetes mellitus without complication ([place])   . Drusen body    see opth note  . ERECTILE DYSFUNCTION 03/22/2009  . GERD 03/22/2009  . HYPERGLYCEMIA 04/25/2010  . HYPERLIPIDEMIA 03/22/2009  . 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 LATERAL EPICONDYLITIS, LEFT 10/24/2009  . LIVER FUNCTION TESTS, ABNORMAL 04/25/2010  . Local reaction to immunization 05/05/2012   minor resolving  zostavax   . Myocardial infarction ([place]) mi2003  . Numbness in left leg    foot related to back disease and surgery  . Obesity, unspecified 04/24/2009  . Perforated appendicitis with necrosis s/p open appendectomy 06/07/14 06/04/2014  . Renal cyst    Characterized by MRI as simple  . Ruptured suppurative appendicitis    2015   . SLEEP APNEA, OBSTRUCTIVE 03/22/2009   compliant with CPAP  . THROMBOCYTOPENIA 08/16/2010  . TOBACCO USE, QUIT 10/24/2009  . 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 Umbilical hernia     Past Surgical History:  Procedure Laterality Date  . APPENDECTOMY N/A 06/06/2014   Procedure: APPENDECTOMY;  Surgeon: Odis Hollingshead, MD;  Location: [facility];  Service: General;  Laterality: N/A;  . COLON SURGERY    . COLONOSCOPY  11/15/2011   Procedure: COLONOSCOPY;  Surgeon:  Juanita Craver, MD;  Location: [facility];  Service: Endoscopy;  Laterality: N/A;  . COLONOSCOPY WITH PROPOFOL N/A 01/03/2017   Procedure: COLONOSCOPY WITH PROPOFOL;  Surgeon: Carol Ada, MD;  Location: [facility];  Service: Endoscopy;  Laterality: N/A;  . cyst on epiglottis  08/2002  . ESOPHAGOGASTRODUODENOSCOPY  11/15/2011   Procedure: ESOPHAGOGASTRODUODENOSCOPY (EGD);  Surgeon: Juanita Craver, MD;  Location: [facility];  Service: Endoscopy;  Laterality: N/A;  . LAPAROSCOPIC APPENDECTOMY N/A 06/06/2014   Procedure: APPENDECTOMY LAPAROSCOPIC attemted;  Surgeon: Odis Hollingshead, MD;  Location: [facility];  Service: General;  Laterality: N/A;  .  LUMBAR DISC SURGERY     two  holes in spinalcovering  . stent     LAD DUS 2004  . surgery l4-l5   1998   ruptured x 3  . ulnar neuropathy    . UMBILICAL HERNIA REPAIR     mesh    Social History   Tobacco Use  Smoking Status Former Smoker  . Packs/day: 2.00  . Years: 42.00  . Pack years: 84.00  . Types: Cigarettes  . Quit date: 12/30/2008  . Years since quitting: 10.6  Smokeless Tobacco Never Used  Tobacco Comment   started at age 68; 1-2 ppd; quit 2010    Social History   Substance and Sexual Activity  Alcohol Use No  . Alcohol/week: 0.0 standard drinks   Comment: rarely; maybe 1 beer a year     Allergies  Allergen Reactions  . Novocain [Procaine] Hives    Dentist appointment in 1968; since then has tolerated lidocaine and provocaine with no hives or difficulty.  . Penicillins     Childhood reaction-details unknown  . Pseudoephedrine Other (See Comments)    Patient went into afib  . Sulfonamide Derivatives     Childhood reaction   . Cardizem [Diltiazem] Rash    Also 2019  . Pseudoephedrine Hcl Palpitations    Current Facility-Administered Medications  Medication Dose Route Frequency Provider Last Rate Last Dose  . acetaminophen (TYLENOL) tablet 650 mg  650 mg Oral Q6H PRN Karmen Bongo, MD   650 mg at 08/30/19 [id number]   Or   . acetaminophen (TYLENOL) suppository 650 mg  650 mg Rectal Q6H PRN Karmen Bongo, MD      . acetaminophen (TYLENOL) tablet 325 mg  325 mg Oral Once Bodenheimer, Charles A, NP      . albuterol (PROVENTIL) (2.5 MG/3ML) 0.083% nebulizer solution 2.5 mg  2.5 mg Nebulization Q4H PRN Karmen Bongo, MD      . amoxicillin (AMOXIL) capsule 500 mg  500 mg Oral Once [place] Callas, NP      . atorvastatin (LIPITOR) tablet 80 mg  80 mg Oral Ivery Quale, MD   80 mg at 08/30/19 2130  . bisacodyl (DULCOLAX) suppository 10 mg  10 mg Rectal Daily PRN Aline August, MD      . brimonidine (ALPHAGAN) 0.15 % ophthalmic solution 1 drop  1 drop Both Eyes TID Karmen Bongo, MD   1 drop at 08/31/19 0925  . cyclobenzaprine (FLEXERIL) tablet 10 mg  10 mg Oral TID PRN Karmen Bongo, MD   10 mg at 08/30/19 1626  . diphenhydrAMINE (BENADRYL) injection 25 mg  25 mg Intravenous Once PRN [place] Callas, NP      . dofetilide (TIKOSYN) capsule 500 mcg  500 mcg Oral BID Aline August, MD   500 mcg at 08/31/19 [id number]  . fluticasone furoate-vilanterol (BREO ELLIPTA) 200-25 MCG/INH 1 puff  1 puff Inhalation Daily PRN Karmen Bongo, MD      . insulin aspart (novoLOG) injection 0-15 Units  0-15 Units Subcutaneous TID WC Karmen Bongo, MD   2 Units at 08/31/19 [phone]  . insulin aspart (novoLOG) injection 0-5 Units  0-5 Units Subcutaneous QHS Karmen Bongo, MD      . ipratropium-albuterol (DUONEB) 0.5-2.5 (3) MG/3ML nebulizer solution 3 mL  3 mL Nebulization TID Karmen Bongo, MD   3 mL at 08/31/19 0750  . lidocaine (PF) (XYLOCAINE) 1 % injection 5 mL  5 mL Intradermal Once Karmen Bongo, MD      . loratadine (  CLARITIN) tablet 10 mg  10 mg Oral Daily Karmen Bongo, MD   10 mg at 08/31/19 [id number]  . LORazepam (ATIVAN) injection 0.5 mg  0.5 mg Intravenous Once Alekh, Kshitiz, MD      . morphine 2 MG/ML injection 2 mg  2 mg Intravenous Q2H PRN Karmen Bongo, MD   2 mg at 08/27/19 1727  . ondansetron (ZOFRAN)  tablet 4 mg  4 mg Oral Q6H PRN Karmen Bongo, MD       Or  . ondansetron [facility]) injection 4 mg  4 mg Intravenous Q6H PRN Karmen Bongo, MD      . oxyCODONE-acetaminophen (PERCOCET/ROXICET) 5-325 MG per tablet 1 tablet  1 tablet Oral Q4H PRN Karmen Bongo, MD   1 tablet at 08/31/19 [phone]  . pantoprazole (PROTONIX) EC tablet 40 mg  40 mg Oral Daily Karmen Bongo, MD   40 mg at 08/31/19 [id number]  . polyethylene glycol (MIRALAX / GLYCOLAX) packet 17 g  17 g Oral BID Aline August, MD   17 g at 08/31/19 0922  . senna-docusate (Senokot-S) tablet 1 tablet  1 tablet Oral BID Aline August, MD   1 tablet at 08/31/19 0922  . sodium phosphate (FLEET) 7-19 GM/118ML enema 1 enema  1 enema Rectal Once PRN Karmen Bongo, MD        Facility-Administered Medications Prior to Admission  Medication Dose Route Frequency Provider Last Rate Last Dose  . ipratropium-albuterol (DUONEB) 0.5-2.5 (3) MG/3ML nebulizer solution 3 mL  3 mL Nebulization Q6H Panosh, Standley Brooking, MD   3 mL at 01/02/18 1545   Medications Prior to Admission  Medication Sig Dispense Refill Last Dose  . acetaminophen (TYLENOL) 325 MG tablet Take 650 mg by mouth every 6 (six) hours as needed for mild pain.   Past Week at Unknown time  . albuterol (PROVENTIL HFA;VENTOLIN HFA) 108 (90 Base) MCG/ACT inhaler Inhale 2 puffs into the lungs every 6 (six) hours as needed for wheezing or shortness of breath. 1 Inhaler 0 Past Month at Unknown time  . ALPHAGAN P 0.1 % SOLN Place 1 drop into both eyes 3 (three) times daily.   3 Past Week at Unknown time  . atorvastatin (LIPITOR) 80 MG tablet TAKE 1 TABLET BY MOUTH  DAILY (Patient taking differently: Take 80 mg by mouth at bedtime. ) 90 tablet 1 08/26/2019 at Unknown time  . cyclobenzaprine (FLEXERIL) 10 MG tablet Take 1 tablet (10 mg total) by mouth 3 (three) times daily as needed for muscle spasms. 40 tablet 0 08/26/2019 at Unknown time  . diclofenac sodium (VOLTAREN) 1 % GEL Apply 4 g topically 4 (four)  times daily. 100 g 0 08/26/2019 at Unknown time  . diphenhydrAMINE (BENADRYL) 25 MG tablet Take 25 mg by mouth at bedtime as needed for sleep.   Past Week at Unknown time  . dofetilide (TIKOSYN) 500 MCG capsule Take 1 capsule (500 mcg total) by mouth 2 (two) times daily. 180 capsule 2 08/26/2019 at Unknown time  . famotidine (PEPCID) 10 MG tablet Take 10 mg by mouth as needed for heartburn.    08/26/2019 at Unknown time  . fexofenadine (ALLEGRA) 180 MG tablet Take 180 mg by mouth daily.   Past Week at Unknown time  . fish oil-omega-3 fatty acids 1000 MG capsule Take 1 g by mouth 2 (two) times daily.    08/26/2019 at Unknown time  . fluticasone (FLONASE) 50 MCG/ACT nasal spray Place 2 sprays into both nostrils daily as needed for allergies. 48 g  prn Past Month at Unknown time  . fluticasone furoate-vilanterol (BREO ELLIPTA) 200-25 MCG/INH AEPB Inhale 1 puff into the lungs daily as needed (shortness of breath).    08/26/2019 at Unknown time  . HYDROcodone-acetaminophen (NORCO) 5-325 MG tablet Take 1 tablet by mouth every 4 (four) hours as needed for moderate pain. One to two tabs every 4-6 hours for pain 20 tablet 0 08/26/2019 at Unknown time  . KLOR-CON M20 20 MEQ tablet TAKE 1 TABLET BY MOUTH EVERY DAY (Patient taking differently: Take 20 mEq by mouth daily. ) 90 tablet 3 08/26/2019 at Unknown time  . lidocaine (LIDODERM) 5 % Place 1 patch onto the skin daily. Remove & Discard patch within 12 hours or as directed by MD 30 patch 0 08/26/2019 at Unknown time  . Magnesium (V-R MAGNESIUM) 250 MG TABS Take 250 mg by mouth at bedtime. Take with 500mg  tablet to equal 750mg     08/26/2019 at Unknown time  . Magnesium Oxide 500 MG TABS Take 500 mg by mouth at bedtime. Take with 250mg  tablet to equal 750mg    08/26/2019 at Unknown time  . metFORMIN (GLUCOPHAGE-XR) 500 MG 24 hr tablet Take as directed (Patient taking differently: Take 500 mg by mouth 3 (three) times daily. ) 270 tablet 3 08/26/2019 at Unknown time  . MULTIPLE  VITAMIN PO Take 1 tablet by mouth every evening.    08/26/2019 at Unknown time  . ondansetron (ZOFRAN ODT) 4 MG disintegrating tablet Take 1 tablet (4 mg total) by mouth every 8 (eight) hours as needed for nausea or vomiting. 24 tablet 0 08/26/2019 at Unknown time  . oxyCODONE-acetaminophen (PERCOCET/ROXICET) 5-325 MG tablet Take 1 tablet by mouth every 4 (four) hours as needed for severe pain. 6 tablet 0 08/26/2019 at Unknown time  . pantoprazole (PROTONIX) 40 MG tablet Take 40 mg by mouth daily.   08/26/2019 at Unknown time  . predniSONE (DELTASONE) 20 MG tablet Take 3,3,3,2,2,2,1,1,1, 1/.2 1./2 1/.2 pills po qd taper 24 tablet 0 08/26/2019 at Unknown time  . Suvorexant (BELSOMRA) 20 MG TABS Take 1 tablet by mouth at bedtime. 90 tablet 1 08/26/2019 at Unknown time  . tamsulosin (FLOMAX) 0.4 MG CAPS capsule TAKE 1 CAPSULE BY MOUTH EVERY DAY (Patient taking differently: Take 0.4 mg by mouth daily as needed (repeat kidney stone). ) 30 capsule 5 08/26/2019 at Unknown time  . triamcinolone cream (KENALOG) 0.1 % Apply 1 application topically 2 (two) times daily. As needed. (Patient taking differently: Apply 1 application topically 2 (two) times daily as needed (rash). ) 30 g 0 Past Week at Unknown time  . nitroGLYCERIN (NITROSTAT) 0.4 MG SL tablet Place 1 tablet (0.4 mg total) under the tongue every 5 (five) minutes as needed for chest pain. 25 tablet 12   . ONETOUCH VERIO test strip USE TWICE A DAY 50 each 12     Family History  Problem Relation Age of Onset  . Thyroid disease Mother   . Ovarian cancer Mother   . Breast cancer Mother   . Lung cancer Father   . Cancer Father      Review of Systems:      Cardiac Review of Systems: Y or  [    ]= no  Chest Pain [ y   ]  Resting SOB [  y ] Exertional SOB  [n  ]  Orthopnea [  ]   Pedal Edema [   ]    Palpitations [  ] Syncope  [  ]  Presyncope [   ]  General Review of Systems: [Y] = yes [  ]=no Constitional: recent weight change [y]; anorexia [  ];  fatigue [  ]; nausea [  ]; night sweats [  ]; fever [  ]; or chills [  ]                                                               Dental: Last Dentist visit:   Eye : blurred vision [  ]; diplopia [   ]; vision changes [  ];  Amaurosis fugax[  ]; Resp: cough [  ];  wheezing[  ];  hemoptysis[  ]; shortness of breath[  ]; paroxysmal nocturnal dyspnea[  ]; dyspnea on exertion[  ]; or orthopnea[  ];  GI:  gallstones[  ], vomiting[  ];  dysphagia[  ]; melena[  ];  hematochezia [  ]; heartburn[  ];   Hx of  Colonoscopy[  ]; GU: kidney stones [  ]; hematuria[  ];   dysuria [  ];  nocturia[  ];  history of     obstruction [  ]; urinary frequency [  ]             Skin: rash, swelling[  ];, hair loss[  ];  peripheral edema[  ];  or itching[  ]; Musculosketetal: myalgias[y  ];  joint swelling[y  ];  joint erythema[y  ];  joint pain[ y ];  back pain[y  ];  Heme/Lymph: bruising[  ];  bleeding[  ];  anemia[  ];  Neuro: TIA[  ];  headaches[  ];  stroke[  ];  vertigo[  ];  seizures[  ];   paresthesias[  ];  difficulty walking[  ];  Psych:depression[  ]; anxiety[  ];  Endocrine: diabetes[  ];  thyroid dysfunction[  ];             Physical Exam: BP 118/69 (BP Location: Right Arm)   Pulse 82   Temp 97.8 F (36.6 C) (Oral)   Resp 15   Ht 6\' 1"  (1.854 m)   Wt 117.9 kg   SpO2 94%   BMI 34.29 kg/m    General appearance: alert, appears stated age and moderate distress Head: Normocephalic, without obvious abnormality, atraumatic Neck: no adenopathy, no carotid bruit, no JVD and thyroid not enlarged, symmetric, no tenderness/mass/nodules Lymph nodes: Cervical, supraclavicular, and axillary nodes normal. Resp: clear to auscultation bilaterally Cardio: RRR without murmur, monitor shows NSR with occasional PAC's GI: soft, non-tender; bowel sounds normal; no masses,  no organomegaly Extremities: extremities normal, atraumatic, no cyanosis or edema.  There is obvious swelling over the left sternoclavicular  joint with exquisite tenderness in the entire region. There is minimal erythema and no open wound or drainage.  Neurologic: Grossly normal  Diagnostic Studies & Laboratory data:     Recent Radiology Findings:   Mr Lorelee New Contrast  Addendum Date: 08/31/2019   ADDENDUM REPORT: 08/31/2019 11:31 ADDENDUM: Within the findings under bone/joint/cartilage section they should read that there is cortical irregularity seen at the LEFT distal clavicle at the sternoclavicular joint with joint fluid within the LEFT sternoclavicular joint. There is also mild joint space loss seen at the right sternoclavicular joint. Electronically Signed   By: Prudencio Pair  M.D.   On: 08/31/2019 11:31   Result Date: 08/31/2019 CLINICAL DATA:  Question of dislocation of the sternoclavicular joint and suspected infection. EXAM: MR STERNUM WITHOUT CONTRAST TECHNIQUE: Multiplanar, multisequence MR imaging of the sternum was performed. No intravenous contrast was administered. COMPARISON:  CT August 29, 2019 FINDINGS: Limited due to patient motion and technique. Bones/Joint/Cartilage There is cortical irregularity seen at the right distal clavicle at the sternoclavicular joint with subtle T1 hypointensity and T2 hyperintensity involving the articular surface of the distal clavicle. There is joint fluid seen within the right sternoclavicular joint. Significant surrounding soft tissue edema is noted. No fracture or dislocation is noted. There is mild joint space loss seen at the left sternoclavicular joint with marginal osteophyte formation. Ligaments Suboptimal due to technique. Muscles and Tendons Feathery increased STIR signal seen along the left pectoralis musculature anteriorly. Limited visualization of the tendons is seen. Soft tissues Significant soft tissue stranding and edema is seen along the left sternoclavicular joint and chest wall extending anteriorly. No loculated fluid collection is noted within the soft tissues. IMPRESSION:  Findings suggestive septic left sternoclavicular joint with possible early osteomyelitis involving the distal clavicle. Significant surrounding soft tissue edema and left pectoralis muscular edema No definite sternoclavicular dislocation. These results will be called to the ordering clinician or representative by the Radiologist Assistant, and communication documented in the PACS or zVision Dashboard. Electronically Signed: By: Prudencio Pair M.D. On: 08/31/2019 08:04     I have independently reviewed the above radiologic studies and discussed with the patient   Recent Lab Findings: Lab Results  Component Value Date   WBC 10.7 (H) 08/31/2019   HGB 15.8 08/31/2019   HCT 45.3 08/31/2019   PLT 88 (L) 08/31/2019   GLUCOSE 195 (H) 08/31/2019   CHOL 85 02/08/2019   TRIG 110.0 02/08/2019   HDL 30.00 (L) 02/08/2019   LDLCALC 33 02/08/2019   ALT 40 08/29/2019   AST 24 08/29/2019   NA 133 (L) 08/31/2019   K 3.6 08/31/2019   CL 97 (L) 08/31/2019   CREATININE 0.79 08/31/2019   BUN 17 08/31/2019   CO2 24 08/31/2019   TSH 1.414 08/27/2019   INR 1.1 08/27/2019   HGBA1C 6.7 (H) 07/21/2019      Assessment / Plan:      Very pleasant 68 year old male with the above described medical problems admitted 3 days ago with back and shoulder pain.  During the course of the admission he was discovered to have methicillin sensitive Staphylococcus aureus bacteremia.  He has been treated with IV Ancef for the past 72 hours.  Yesterday, he was discovered to have tenderness over the left sternoclavicular joint.  MRI confirmed significant cortical irregularity and a fluid collection at the site consistent with a joint abscess.   Operative exploration with drainage of the abscess and debridement as required is indicated.  The procedure was described to the patient in detail and his questions were answered.  He would like for [place] to proceed as soon as possible.   [name] Odea, PA-C 08/31/2019 12:08  PM  Patient history was reviewed and patient seen and examined.  He notes admission 5 days ago with back pain, 30 pound weight loss, left shoulder and sternoclavicular pain.  He notes that since starting on antibiotics he seen no improvement in the left sternoclavicular pain.  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  With the lack of improvement with conservative treatment I have recommended to him that we proceed as  soon as possible with incision and drainage and debridement if needed of the left sternoclavicular joint and placement of wound VAC.  Of also discussed with his wife the recommendations.  Risks and options of surgery have been discussed with him and is willing to proceed.  I discussed the case with anesthesia who is agreeable to proceed with general anesthesia at 3:00, with his last p.o. intake before since 9 AM this morning.   The goals risks and alternatives of the planned surgical procedure Procedure(s): STERNAL WOUND DEBRIDEMENT with application of wound vac (Left)  have been discussed with the patient in detail. The risks of the procedure including death, infection, stroke, myocardial infarction, bleeding, blood transfusion have all been discussed specifically.  I have quoted [name] [name]. a 1% of perioperative mortality and a complication rate as high as 10 %. The patient's questions have been answered.[name] [name]. is willing  to proceed with the planned procedure.

## 2019-08-31 NOTE — Progress Notes (Signed)
RT note:  NIF -40 with great patient effort VC not completed patient experiencing back pain wanted to be placed on CPAP machine.  RT assisted patient with home CPAP machine and bled 3L into machine.

## 2019-08-31 NOTE — Anesthesia Postprocedure Evaluation (Signed)
Anesthesia Post Note  Patient: [name] [name].  Procedure(s) Performed: LEFT STERNOCLAVICULAR WOUND DEBRIDEMENT WITH APPLICATION OF WOUND VAC (Left )     Patient location during evaluation: PACU Anesthesia Type: General Level of consciousness: awake and alert Pain management: pain level controlled Vital Signs Assessment: post-procedure vital signs reviewed and stable Respiratory status: spontaneous breathing, nonlabored ventilation, respiratory function stable and patient connected to nasal cannula oxygen Cardiovascular status: blood pressure returned to baseline and stable Postop Assessment: no apparent nausea or vomiting Anesthetic complications: no    Last Vitals:  Vitals:   08/31/19 1700 08/31/19 1720  BP:  125/73  Pulse:  92  Resp:  18  Temp: 36.7 C 36.9 C  SpO2:  91%    Last Pain:  Vitals:   08/31/19 1720  TempSrc: Oral  PainSc:                  Effie Berkshire

## 2019-08-31 NOTE — Transfer of Care (Signed)
Immediate Anesthesia Transfer of Care Note  Patient: [name] [name].  Procedure(s) Performed: LEFT STERNOCLAVICULAR WOUND DEBRIDEMENT WITH APPLICATION OF WOUND VAC (Left )  Patient Location: PACU  Anesthesia Type:General  Level of Consciousness: drowsy, patient cooperative and responds to stimulation  Airway & Oxygen Therapy: Patient Spontanous Breathing and Patient connected to face mask oxygen  Post-op Assessment: Report given to RN and Post -op Vital signs reviewed and stable  Post vital signs: Reviewed and stable  Last Vitals:  Vitals Value Taken Time  BP 134/74 08/31/19 1605  Temp    Pulse 94 08/31/19 1608  Resp 9 08/31/19 1608  SpO2 99 % 08/31/19 1608  Vitals shown include unvalidated device data.  Last Pain:  Vitals:   08/31/19 1227  TempSrc: Oral  PainSc:          Complications: No apparent anesthesia complications

## 2019-08-31 NOTE — Anesthesia Preprocedure Evaluation (Addendum)
Anesthesia Evaluation  Patient identified by MRN, date of birth, ID band Patient awake    Reviewed: Allergy & Precautions, NPO status , Patient's Chart, lab work & pertinent test results  Airway Mallampati: III  TM Distance: >3 FB Neck ROM: Full    Dental  (+) Teeth Intact, Dental Advisory Given   Pulmonary sleep apnea , former smoker,    breath sounds clear to auscultation       Cardiovascular + CAD, + Past MI and + Cardiac Stents  + dysrhythmias Atrial Fibrillation  Rhythm:Regular Rate:Normal     Neuro/Psych negative neurological ROS  negative psych ROS   GI/Hepatic Neg liver ROS, GERD  Medicated,  Endo/Other  diabetes, Type 2, Oral Hypoglycemic Agents  Renal/GU      Musculoskeletal  (+) Arthritis ,   Abdominal Normal abdominal exam  (+)   Peds  Hematology negative hematology ROS (+)   Anesthesia Other Findings   Reproductive/Obstetrics                            Lab Results  Component Value Date   WBC 10.7 (H) 08/31/2019   HGB 15.8 08/31/2019   HCT 45.3 08/31/2019   MCV 89.0 08/31/2019   PLT 88 (L) 08/31/2019   Lab Results  Component Value Date   CREATININE 0.79 08/31/2019   BUN 17 08/31/2019   NA 133 (L) 08/31/2019   K 3.6 08/31/2019   CL 97 (L) 08/31/2019   CO2 24 08/31/2019   Lab Results  Component Value Date   INR 1.1 08/27/2019   INR 1.31 12/16/2016   INR 1.27 06/05/2014   PROTIME 20.7 06/13/2009    Echo: 1. The left ventricle has normal systolic function with an ejection fraction of 60-65%. The cavity size was normal. There is mildly increased left ventricular wall thickness. Left ventricular diastolic Doppler parameters are consistent with impaired  relaxation. Indeterminate filling pressures The E/e' is 8-15. No evidence of left ventricular regional wall motion abnormalities.  2. The right ventricle has normal systolic function. The cavity was normal. There is no  increase in right ventricular wall thickness.  3. Left atrial size was mildly dilated.  4. The mitral valve is abnormal. Mild thickening of the mitral valve leaflet. No mitral valve vegetation visualized.  5. The tricuspid valve is grossly normal.  6. No vegetation on the aortic valve.  7. The aortic valve is tricuspid. Mild sclerosis of the aortic valve. Aortic valve regurgitation is trivial by color flow Doppler. No stenosis of the aortic valve.  8. The aorta is normal unless otherwise noted.   Anesthesia Physical Anesthesia Plan  ASA: III  Anesthesia Plan: General   Post-op Pain Management:    Induction: Intravenous  PONV Risk Score and Plan: 3 and Ondansetron, Dexamethasone and Treatment may vary due to age or medical condition  Airway Management Planned: Oral ETT and Video Laryngoscope Planned  Additional Equipment: None  Intra-op Plan:   Post-operative Plan: Extubation in OR  Informed Consent: I have reviewed the patients History and Physical, chart, labs and discussed the procedure including the risks, benefits and alternatives for the proposed anesthesia with the patient or authorized representative who has indicated his/her understanding and acceptance.     Dental advisory given  Plan Discussed with: CRNA  Anesthesia Plan Comments:        Anesthesia Quick Evaluation

## 2019-08-31 NOTE — Anesthesia Procedure Notes (Addendum)
Procedure Name: Intubation Date/Time: 08/31/2019 3:17 PM Performed by: Jearld Pies, CRNA Pre-anesthesia Checklist: Patient identified, Emergency Drugs available, Suction available and Patient being monitored Patient Re-evaluated:Patient Re-evaluated prior to induction Oxygen Delivery Method: Circle System Utilized Preoxygenation: Pre-oxygenation with 100% oxygen Induction Type: IV induction, Rapid sequence and Cricoid Pressure applied Laryngoscope Size: 4 and Glidescope Grade View: Grade I Tube type: Oral Tube size: 7.5 mm Number of attempts: 1 Airway Equipment and Method: Stylet Placement Confirmation: ETT inserted through vocal cords under direct vision,  positive ETCO2 and breath sounds checked- equal and bilateral Secured at: 24 cm Tube secured with: Tape Dental Injury: Teeth and Oropharynx as per pre-operative assessment  Comments: RSI d/t eating full meal at 0900 this am; Glidescope utilized d/t previous intubation note of Grade 3 view with MAC 4.

## 2019-08-31 NOTE — Brief Op Note (Signed)
      [address]       [place] [address]             [phone]      08/31/2019  4:10 PM  PATIENT:  [name] [name].  68 y.o. male  PRE-OPERATIVE DIAGNOSIS:  infected left sternoclavicular joint  POST-OPERATIVE DIAGNOSIS:  infected left sternoclavicular joint  PROCEDURE:  Procedure(s): LEFT STERNOCLAVICULAR WOUND DEBRIDEMENT WITH APPLICATION OF WOUND VAC (Left)  SURGEON:  Surgeon(s) and Role:    * Grace Isaac, MD - Primary   ANESTHESIA:   general  EBL:  10 mL   BLOOD ADMINISTERED:none  DRAINS: wound vac in place   LOCAL MEDICATIONS USED:  NONE  SPECIMEN:  Source of Specimen:  left sternoclavicular joint   DISPOSITION OF SPECIMEN:  path and micro  COUNTS:  YES   DICTATION: .Dragon Dictation  PLAN OF CARE: patient is in patient   PATIENT DISPOSITION:  PACU - hemodynamically stable.   Delay start of Pharmacological VTE agent (>24hrs) due to surgical blood loss or risk of bleeding: yes

## 2019-09-01 ENCOUNTER — Inpatient Hospital Stay (HOSPITAL_COMMUNITY): Payer: Medicare Other

## 2019-09-01 ENCOUNTER — Encounter (HOSPITAL_COMMUNITY): Payer: Self-pay | Admitting: Cardiothoracic Surgery

## 2019-09-01 DIAGNOSIS — R531 Weakness: Secondary | ICD-10-CM

## 2019-09-01 DIAGNOSIS — M869 Osteomyelitis, unspecified: Secondary | ICD-10-CM

## 2019-09-01 DIAGNOSIS — Z978 Presence of other specified devices: Secondary | ICD-10-CM

## 2019-09-01 DIAGNOSIS — R079 Chest pain, unspecified: Secondary | ICD-10-CM

## 2019-09-01 DIAGNOSIS — E7800 Pure hypercholesterolemia, unspecified: Secondary | ICD-10-CM

## 2019-09-01 DIAGNOSIS — R29818 Other symptoms and signs involving the nervous system: Secondary | ICD-10-CM

## 2019-09-01 LAB — CBC WITH DIFFERENTIAL/PLATELET
Abs Immature Granulocytes: 0.09 10*3/uL — ABNORMAL HIGH (ref 0.00–0.07)
Basophils Absolute: 0 10*3/uL (ref 0.0–0.1)
Basophils Relative: 0 %
Eosinophils Absolute: 0 10*3/uL (ref 0.0–0.5)
Eosinophils Relative: 0 %
HCT: 46 % (ref 39.0–52.0)
Hemoglobin: 15.4 g/dL (ref 13.0–17.0)
Immature Granulocytes: 1 %
Lymphocytes Relative: 7 %
Lymphs Abs: 0.9 10*3/uL (ref 0.7–4.0)
MCH: 30.3 pg (ref 26.0–34.0)
MCHC: 33.5 g/dL (ref 30.0–36.0)
MCV: 90.6 fL (ref 80.0–100.0)
Monocytes Absolute: 1.4 10*3/uL — ABNORMAL HIGH (ref 0.1–1.0)
Monocytes Relative: 11 %
Neutro Abs: 9.9 10*3/uL — ABNORMAL HIGH (ref 1.7–7.7)
Neutrophils Relative %: 81 %
Platelets: 101 10*3/uL — ABNORMAL LOW (ref 150–400)
RBC: 5.08 MIL/uL (ref 4.22–5.81)
RDW: 13.5 % (ref 11.5–15.5)
WBC: 12.3 10*3/uL — ABNORMAL HIGH (ref 4.0–10.5)
nRBC: 0 % (ref 0.0–0.2)

## 2019-09-01 LAB — BASIC METABOLIC PANEL
Anion gap: 11 (ref 5–15)
BUN: 17 mg/dL (ref 8–23)
CO2: 27 mmol/L (ref 22–32)
Calcium: 9.2 mg/dL (ref 8.9–10.3)
Chloride: 97 mmol/L — ABNORMAL LOW (ref 98–111)
Creatinine, Ser: 0.99 mg/dL (ref 0.61–1.24)
GFR calc Af Amer: >60 mL/min (ref >60)
GFR calc non Af Amer: >60 mL/min (ref >60)
Glucose, Bld: 181 mg/dL — ABNORMAL HIGH (ref 70–99)
Potassium: 4.3 mmol/L (ref 3.5–5.1)
Sodium: 135 mmol/L (ref 135–145)

## 2019-09-01 LAB — ACID FAST SMEAR (AFB, MYCOBACTERIA): Acid Fast Smear: NEGATIVE

## 2019-09-01 LAB — GLUCOSE, CAPILLARY
Glucose-Capillary: 136 mg/dL — ABNORMAL HIGH (ref 70–99)
Glucose-Capillary: 132 mg/dL — ABNORMAL HIGH (ref 70–99)
Glucose-Capillary: 137 mg/dL — ABNORMAL HIGH (ref 70–99)
Glucose-Capillary: 196 mg/dL — ABNORMAL HIGH (ref 70–99)

## 2019-09-01 LAB — MAGNESIUM: Magnesium: 1.9 mg/dL (ref 1.7–2.4)

## 2019-09-01 LAB — C-REACTIVE PROTEIN: CRP: 6.1 mg/dL — ABNORMAL HIGH (ref <1.0)

## 2019-09-01 MED ORDER — MAGNESIUM SULFATE 2 GM/50ML IV SOLN
2.0000 g | Freq: Once | INTRAVENOUS | Status: AC
  Administered 2019-09-01: 2 g via INTRAVENOUS
  Filled 2019-09-01: qty 50

## 2019-09-01 MED ORDER — LORAZEPAM 2 MG/ML IJ SOLN
1.0000 mg | Freq: Once | INTRAMUSCULAR | Status: AC
  Administered 2019-09-01: 1 mg via INTRAVENOUS
  Filled 2019-09-01: qty 1

## 2019-09-01 MED ORDER — GADOBUTROL 1 MMOL/ML IV SOLN
8.0000 mL | Freq: Once | INTRAVENOUS | Status: AC | PRN
  Administered 2019-09-01: 8 mL via INTRAVENOUS

## 2019-09-01 MED ORDER — DABIGATRAN ETEXILATE MESYLATE 150 MG PO CAPS
150.0000 mg | ORAL_CAPSULE | Freq: Two times a day (BID) | ORAL | Status: DC
  Administered 2019-09-01 – 2019-09-15 (×28): 150 mg via ORAL
  Filled 2019-09-01 (×33): qty 1

## 2019-09-01 MED ORDER — FLUTICASONE FUROATE-VILANTEROL 200-25 MCG/INH IN AEPB
1.0000 | INHALATION_SPRAY | Freq: Every day | RESPIRATORY_TRACT | Status: DC
  Administered 2019-09-02 – 2019-09-15 (×14): 1 via RESPIRATORY_TRACT
  Filled 2019-09-01: qty 28

## 2019-09-01 NOTE — Op Note (Signed)
NAME: [name] [name], [name] [name] MEDICAL RECORD [id number] ACCOUNT [id number] DATE OF BIRTH:May 14, 1951 FACILITY: [facility] LOCATION: [facility] PHYSICIAN:Elva Breaker Maryruth Bun, MD  OPERATIVE REPORT  DATE OF PROCEDURE:  08/31/2019  PREOPERATIVE DIAGNOSIS:  Infected left sternoclavicular joint.  POSTOPERATIVE DIAGNOSIS:  Infected left sternoclavicular joint.  SURGICAL PROCEDURE:  Incision and drainage and debridement of left sternoclavicular joint with application of wound VAC.  SURGEON:  Lanelle Bal, MD  BRIEF HISTORY:  The patient is a 68 year old male who over the past month had a 25-30 pound weight loss, generalized weakness.  He had noted increasing pain and weakness in his arms and shoulders and was admitted for further evaluation and found to have  positive blood cultures and tenderness over the left sternoclavicular joint.  MRI of the joint suggested an infection.  With positive blood cultures and evidence of infection in the left sternoclavicular joint, we recommended to the patient that we  proceed with incision and drainage and debridement of the joint.  The patient agreed and signed informed consent.  DESCRIPTION OF PROCEDURE:  The patient underwent general endotracheal anesthesia without incident.  The chest was prepped with Betadine, draped in a sterile manner.  The left side had preoperatively been marked.  Appropriate timeout was performed.  We  then made a 3 cm incision over the sternoclavicular joint, dissecting down to the bony surfaces.  As we did this, we entered an area of purulent material.  This material was sent for stat Gram stain and cultures.  We continued to open area.  There was an  area medially toward the sternum of necrotic-appearing cartilage.  This area was debrided and material sent to pathology.  Using 3 L Pulsavac irrigation, the wound was fully irrigated.  A small black sponge was then trimmed to the appropriate size, and  wound VAC was placed with good seal.  The  patient was awakened in the operating room and extubated.  Blood loss was minimal.  He tolerated the procedure without obvious complication and was transferred to the recovery room for further postoperative care.   Sponge and needle count was reported as correct.  LN/NUANCE  D:09/01/2019 T:09/01/2019 JOB:[id number]

## 2019-09-01 NOTE — Progress Notes (Signed)
Subjective: Pain is gradually improving   Exam: Vitals:   09/01/19 1444 09/01/19 1510  BP:    Pulse:    Resp: 16   Temp: 98.1 F (36.7 C)   SpO2:  92%   Gen: In bed, NAD Resp: non-labored breathing, no acute distress Abd: soft, nt  Neuro: MS: Awake, alert, oriented [id number], EOMI Motor: mainly limited by pain, at least 4/5 throughout.  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  There is a possibility that the discitis could have spread continuously to involve the meninges, though this is by no means definite.  His pain is gradually improving, and currently I think his exam is mostly limited by pain as opposed to true weakness.  I would favor continued antibiotic coverage and PT/OT.  Recommendations: 1) Would consider meningitic dosing for MSSA coverage.  2) PT/OT 3) no further recommendations at this time, please call with further questions or concerns.  Roland Rack, MD [facility] [phone]  If 7pm- 7am, please page neurology on call as listed in [place].

## 2019-09-01 NOTE — Progress Notes (Signed)
ANTICOAGULATION CONSULT NOTE - Initial Consult  Pharmacy Consult for dabigatran Indication: atrial fibrillation  Allergies  Allergen Reactions  . Novocain [Procaine] Hives    Dentist appointment in 1968; since then has tolerated lidocaine and provocaine with no hives or difficulty.  . Pseudoephedrine Other (See Comments)    Patient went into afib  . Sulfonamide Derivatives     Childhood reaction   . Cardizem [Diltiazem] Rash    Also 2019  . Pseudoephedrine Hcl Palpitations    Patient Measurements: Height: 6\' 1"  (185.4 cm) Weight: 259 lb 14.8 oz (117.9 kg) IBW/kg (Calculated) : 79.9   Vital Signs: Temp: 98.1 F (36.7 C) (09/02 1444) Temp Source: Axillary (09/02 1444) BP: 125/69 (09/02 0420) Pulse Rate: 58 (09/02 0420)  Labs: Recent Labs    08/30/19 0347 08/31/19 1053 08/31/19 1328 09/01/19 0252  HGB 15.5 15.8  --  15.4  HCT 45.5 45.3  --  46.0  PLT 76* 88*  --  101*  APTT  --   --  31  --   LABPROT  --   --  14.7  --   INR  --   --  1.2  --   CREATININE 0.87 0.79  --  0.99    Estimated Creatinine Clearance: 94.7 mL/min (by C-G formula based on SCr of 0.99 mg/dL).   Medical History: Past Medical History:  Diagnosis Date  . Atrial fibrillation ([place]) 03/22/2009   a. s/p multiple DCCV; b. no coumadin due to low TE risk profile; c. Tikosyn Rx  . Coronary atherosclerosis of native coronary artery 11/2002   a. s/p stent to LAD 12/03; OM2 occluded at cath 12/03; d. myoview 5/10: no ischemia;  e. echo 7/11: EF 55%, BAE, mild RVE, PASP 41-45; Myoview was in March 2013. There was no ischemia or infarction, EF 51%   . Cutaneous abscess of back excluding buttocks 07/04/2014   Appears to stem from possibly a cyst very large area 6 cm contact surgeon office   . Diabetes mellitus without complication ([place])   . Drusen body    see opth note  . ERECTILE DYSFUNCTION 03/22/2009  . GERD 03/22/2009  . HYPERGLYCEMIA 04/25/2010  . HYPERLIPIDEMIA 03/22/2009  . 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 LATERAL EPICONDYLITIS, LEFT 10/24/2009  . LIVER FUNCTION TESTS, ABNORMAL 04/25/2010  . Local reaction to immunization 05/05/2012   minor resolving  zostavax   . Myocardial infarction ([place]) mi2003  . Numbness in left leg    foot related to back disease and surgery  . Obesity, unspecified 04/24/2009  . Perforated appendicitis with necrosis s/p open appendectomy 06/07/14 06/04/2014  . Renal cyst    Characterized by MRI as simple  . Ruptured suppurative appendicitis    2015   . SLEEP APNEA, OBSTRUCTIVE 03/22/2009   compliant with CPAP  . THROMBOCYTOPENIA 08/16/2010  . TOBACCO USE, QUIT 10/24/2009  . 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 Umbilical hernia    Assessment: 68 yo male with afib on dabigatran PTA. Patient post-op LEFT STERNOCLAVICULAR WOUND DEBRIDEMENT and now will need to be restarted on anticoagulation. Pharmacy consulted to restart dabigatran.   Goal of Therapy:   Monitor platelets by anticoagulation protocol: Yes   Plan:  Restart dabigatran 150mg  PO BID as per home dose.  Monitor for bleeding  Shannah Conteh A. Levada Dy, PharmD, BCPS, FNKF Clinical Pharmacist [place] Please utilize Amion for appropriate phone number to reach the unit pharmacist ([place])  09/01/2019,3:47 PM

## 2019-09-01 NOTE — Progress Notes (Signed)
Occupational Therapy Treatment Patient Details Name: [name] [name]. MRN: [id number] DOB: April 02, 1951 Today's Date: 09/01/2019    History of present illness [name] [name]. is a 68 y.o. male with medical history significant of OSA on CPAP; perforated appy (2015); obesity; CAD s/p stents; HLD; DM; optic nerve neuritis; and afib on Pradaxa presenting with back pain.   OT comments  Pt with increased pain during mobility. Max - total A of 2 for bed mobility. Pt sitting on EOB with max - total A of 2 and reports 10/10 pain. Pt needing manual facilitation for hand placement and weight shift. Pt able to tolerate for 5-6 minutes on EOB. Pt left in R side lying position for comfort. Pt is limited by pain this session but continues to benefit from OT intervention.    Follow Up Recommendations  CIR;Supervision/Assistance - 24 hour    Equipment Recommendations  Other (comment)(defer to next venue of care)       Precautions / Restrictions Precautions Precautions: Fall       Mobility Bed Mobility Overal bed mobility: Needs Assistance Bed Mobility: Sit to Supine;Supine to Sit     Supine to sit: Max assist;+2 for physical assistance;Total assist Sit to supine: Max assist;+2 for physical assistance      Transfers       General transfer comment: unable to tolerate- pt refusal    Balance Overall balance assessment: Needs assistance Sitting-balance support: Feet supported;Bilateral upper extremity supported Sitting balance-Leahy Scale: Zero Sitting balance - Comments: total A on EOB        ADL either performed or assessed with clinical judgement        Vision Patient Visual Report: No change from baseline            Cognition Arousal/Alertness: Awake/alert Behavior During Therapy: WFL for tasks assessed/performed Overall Cognitive Status: Within Functional Limits for tasks assessed                        Pertinent Vitals/ Pain       Pain Assessment:  Faces Faces Pain Scale: Hurts worst Pain Location: lower back Pain Descriptors / Indicators: Discomfort;Grimacing;Guarding Pain Intervention(s): Limited activity within patient's tolerance;Monitored during session;Repositioned         Frequency  Min 2X/week        Progress Toward Goals  OT Goals(current goals can now be found in the care plan section)  Progress towards OT goals: Progressing toward goals  Acute Rehab OT Goals Patient Stated Goal: to decrease pain OT Goal Formulation: With patient/family Time For Goal Achievement: 09/15/19 Potential to Achieve Goals: Good  Plan Discharge plan remains appropriate    Co-evaluation    PT/OT/SLP Co-Evaluation/Treatment: Yes Reason for Co-Treatment: Complexity of the patient's impairments (multi-system involvement);Necessary to address cognition/behavior during functional activity;For patient/therapist safety   OT goals addressed during session: (functional mobility)      AM-PAC OT "6 Clicks" Daily Activity     Outcome Measure   Help from another person eating meals?: None Help from another person taking care of personal grooming?: A Little Help from another person toileting, which includes using toliet, bedpan, or urinal?: Total Help from another person bathing (including washing, rinsing, drying)?: A Lot Help from another person to put on and taking off regular upper body clothing?: A Lot Help from another person to put on and taking off regular lower body clothing?: Total 6 Click Score: 13    End of Session Equipment Utilized During  Treatment: Oxygen  OT Visit Diagnosis: Other abnormalities of gait and mobility (R26.89);Muscle weakness (generalized) (M62.81);Pain Pain - part of body: Shoulder   Activity Tolerance Patient limited by pain   Patient Left in bed;with call bell/phone within reach;with bed alarm set   Nurse Communication Mobility status;Precautions;Patient requests pain meds        Time:  [id number] OT Time Calculation (min): 31 min  Charges: OT General Charges $OT Visit: 1 Visit OT Treatments $Therapeutic Activity: 8-22 mins   Gypsy Decant 09/01/2019, 4:24 PM

## 2019-09-01 NOTE — Progress Notes (Addendum)
[facility]  Date of Admission:  08/27/2019      Total days of antibiotics 5  Day 2 nafcillin            ASSESSMENT: 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 There has also been a concern for possible contiguous meningitis (unable to rule out given high risk LP in the setting of known vertebral disk infection). He is now POD1 sternoclavicular debridement. Left shoulder MRI with possible labrum tear but no concern for joint infection.   TTE unrevealing for vegetations with preserved EF and normal valve function.   He has done well with oral amoxicillin challenge and tolerating nafcillin well > 24 hours now. Will continue this for 2-3 weeks to treat possible contiguous bacterial meningitis and then switch to cefazolin to finish out course of 6 weeks IV (cefazolin with overall better safety profile for home use).   OK from ID perspective to place PICC line for long term antibiotics as long as OK with CT surgery.    PLAN: 1. Continue Nafcillin  2. 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 acetaminophen  325 mg Oral Once  . atorvastatin  80 mg Oral QHS  . brimonidine  1 drop Both Eyes TID  . dabigatran  150 mg Oral Q12H  . dofetilide  500 mcg Oral BID  . fluticasone furoate-vilanterol  1 puff Inhalation Daily  . insulin aspart  0-15 Units Subcutaneous TID WC  . insulin aspart  0-5 Units Subcutaneous QHS  . ipratropium-albuterol  3 mL Nebulization TID   . lidocaine (PF)  5 mL Intradermal Once  . loratadine  10 mg Oral Daily  . mupirocin ointment  1 application Nasal BID  . pantoprazole  40 mg Oral Daily  . polyethylene glycol  17 g Oral BID  . senna-docusate  1 tablet Oral BID    SUBJECTIVE: Not much has changed for [name] [name] overnight. He still has bilateral shoulder pain/stiffness with raising arms with strait and lateral abduction. His anterior chest/left clavicle is still very tender and sore. He feels OK without moving too much. No fevers/chills.   Neurology team recommending coverage for meningitis since it is too risky to perform LP with risk for inoculating CNS with procedure.   Dr. [name] with orthopedics has evaluated him today for shoulder and clavicle/sternum pain; no concern for infection of either shoulder joint and felt to be referred pain from [facility] joint deformity/swelling. Hips/ankles and knees have full range of motion without any discomfort.   Afebrile. WBC 8/31 12.3K  Pre-treatment CRP 1.3 and ESR 8 (08/27/19)  BCx + 8/28 Repeated on 8/29 no growth at day 3 today    Review of Systems: Review of Systems  Constitutional: Positive for weight loss. Negative for chills and fever.  Respiratory: Negative for cough.   Cardiovascular: Positive for chest pain. Negative for leg swelling.  Gastrointestinal: Negative for abdominal pain and vomiting.  Genitourinary: Negative for  dysuria.  Musculoskeletal: Positive for back pain and joint pain (L Dendron joint and L Shoulder ).  Neurological: Positive for weakness. Negative for headaches.    Allergies  Allergen Reactions  . Novocain [Procaine] Hives    Dentist appointment in 1968; since then has tolerated lidocaine and provocaine with no hives or difficulty.  . Pseudoephedrine Other (See Comments)    Patient went into afib  . Sulfonamide Derivatives     Childhood reaction   . Cardizem [Diltiazem] Rash    Also 2019  . Pseudoephedrine Hcl Palpitations    OBJECTIVE: Vitals:    09/01/19 1444 09/01/19 1510 09/01/19 2040 09/01/19 2042  BP:      Pulse:      Resp: 16     Temp: 98.1 F (36.7 C)  97.7 F (36.5 C)   TempSrc: Axillary     SpO2:  92%  95%  Weight:      Height:       Body mass index is 34.29 kg/m.  Physical Exam Constitutional:      Comments: Uncomfortable. Resting in bed after PT/OT therapy.   HENT:     Mouth/Throat:     Mouth: Mucous membranes are moist.  Eyes:     General: No scleral icterus. Cardiovascular:     Rate and Rhythm: Normal rate and regular rhythm.     Heart sounds: No murmur.  Pulmonary:     Effort: Pulmonary effort is normal.     Breath sounds: Normal breath sounds.  Abdominal:     General: Bowel sounds are normal. There is no distension.  Musculoskeletal:       Arms:     Comments: + lower back pain   Skin:    General: Skin is warm and dry.     Capillary Refill: Capillary refill takes less than 2 seconds.  Neurological:     Mental Status: He is alert and oriented to person, place, and time.     Lab Results Lab Results  Component Value Date   WBC 12.3 (H) 09/01/2019   HGB 15.4 09/01/2019   HCT 46.0 09/01/2019   MCV 90.6 09/01/2019   PLT 101 (L) 09/01/2019    Lab Results  Component Value Date   CREATININE 0.99 09/01/2019   BUN 17 09/01/2019   NA 135 09/01/2019   K 4.3 09/01/2019   CL 97 (L) 09/01/2019   CO2 27 09/01/2019    Lab Results  Component Value Date   ALT 40 08/29/2019   AST 24 08/29/2019   ALKPHOS 92 08/29/2019   BILITOT 1.1 08/29/2019     Microbiology: Recent Results (from the past 240 hour(s))  Culture, blood (routine x 2)     Status: Abnormal   Collection Time: 08/27/19  8:54 AM   Specimen: BLOOD  Result Value Ref Range Status   Specimen Description BLOOD RIGHT ANTECUBITAL  Final   Special Requests   Final    BOTTLES DRAWN AEROBIC ONLY Blood Culture adequate volume   Culture  Setup Time   Final    GRAM POSITIVE COCCI IN CLUSTERS AEROBIC BOTTLE ONLY CRITICAL RESULT CALLED  TO, READ BACK BY AND VERIFIED WITH: G. ABBOTT,PHARMD 0217 08/28/2019 T. TYSOR    Culture (A)  Final    STAPHYLOCOCCUS AUREUS SUSCEPTIBILITIES PERFORMED ON PREVIOUS CULTURE WITHIN THE LAST 5 DAYS. Performed at [facility], [place] [address], [place], [place] [address]    Report Status 08/29/2019 FINAL  Final  SARS CORONAVIRUS 2 (TAT 6-12 HRS) Nasal  Swab Aptima Multi Swab     Status: None   Collection Time: 08/27/19  9:09 AM   Specimen: Aptima Multi Swab; Nasal Swab  Result Value Ref Range Status   SARS Coronavirus 2 NEGATIVE NEGATIVE Final    Comment: (NOTE) SARS-CoV-2 target nucleic acids are NOT DETECTED. The SARS-CoV-2 RNA is generally detectable in upper and lower respiratory specimens during the acute phase of infection. Negative results do not preclude SARS-CoV-2 infection, do not rule out co-infections with other pathogens, and should not be used as the sole basis for treatment or other patient management decisions. Negative results must be combined with clinical observations, patient history, and epidemiological information. The expected result is Negative. Fact Sheet for Patients: SugarRoll.be Fact Sheet for Healthcare Providers: https://www.woods-mathews.com/ This test is not yet approved or cleared by the [place] FDA and  has been authorized for detection and/or diagnosis of SARS-CoV-2 by FDA under an Emergency Use Authorization (EUA). This EUA will remain  in effect (meaning this test can be used) for the duration of the COVID-19 declaration under Section 56 4(b)(1) of the Act, 21 U.S.C. section 360bbb-3(b)(1), unless the authorization is terminated or revoked sooner. Performed at [facility], [place] [address], [place], [place] [address]   Culture, blood (routine x 2)     Status: Abnormal   Collection Time: 08/27/19  9:51 AM   Specimen: BLOOD RIGHT ARM  Result Value Ref Range Status   Specimen Description  BLOOD RIGHT ARM  Final   Special Requests   Final    BOTTLES DRAWN AEROBIC AND ANAEROBIC Blood Culture adequate volume   Culture  Setup Time   Final    GRAM POSITIVE COCCI IN CLUSTERS IN BOTH AEROBIC AND ANAEROBIC BOTTLES CRITICAL RESULT CALLED TO, READ BACK BY AND VERIFIED WITH: G. ABBOTT,PHARMD 0217 08/28/2019 Mena Goes Performed at [facility], [place] [address] [place], [place] [address]    Culture STAPHYLOCOCCUS AUREUS (A)  Final   Report Status 08/29/2019 FINAL  Final   Organism ID, Bacteria STAPHYLOCOCCUS AUREUS  Final      Susceptibility   Staphylococcus aureus - MIC*    CIPROFLOXACIN <=0.5 SENSITIVE Sensitive     ERYTHROMYCIN <=0.25 SENSITIVE Sensitive     GENTAMICIN <=0.5 SENSITIVE Sensitive     OXACILLIN <=0.25 SENSITIVE Sensitive     TETRACYCLINE <=1 SENSITIVE Sensitive     VANCOMYCIN <=0.5 SENSITIVE Sensitive     TRIMETH/SULFA <=10 SENSITIVE Sensitive     CLINDAMYCIN <=0.25 SENSITIVE Sensitive     RIFAMPIN <=0.5 SENSITIVE Sensitive     Inducible Clindamycin NEGATIVE Sensitive     * STAPHYLOCOCCUS AUREUS  Blood Culture ID Panel (Reflexed)     Status: Abnormal   Collection Time: 08/27/19  9:51 AM  Result Value Ref Range Status   Enterococcus species NOT DETECTED NOT DETECTED Final   Listeria monocytogenes NOT DETECTED NOT DETECTED Final   Staphylococcus species DETECTED (A) NOT DETECTED Final    Comment: CRITICAL RESULT CALLED TO, READ BACK BY AND VERIFIED WITH: G. ABBOTT,PHARMD 0217 08/28/2019 T. TYSOR    Staphylococcus aureus (BCID) DETECTED (A) NOT DETECTED Final    Comment: Methicillin (oxacillin) susceptible Staphylococcus aureus (MSSA). Preferred therapy is anti staphylococcal beta lactam antibiotic (Cefazolin or Nafcillin), unless clinically contraindicated. CRITICAL RESULT CALLED TO, READ BACK BY AND VERIFIED WITH: G. ABBOTT,PHARMD 0217 08/28/2019 T. TYSOR    Methicillin resistance NOT DETECTED NOT DETECTED Final   Streptococcus species NOT DETECTED NOT  DETECTED Final   Streptococcus agalactiae  NOT DETECTED NOT DETECTED Final   Streptococcus pneumoniae NOT DETECTED NOT DETECTED Final   Streptococcus pyogenes NOT DETECTED NOT DETECTED Final   Acinetobacter baumannii NOT DETECTED NOT DETECTED Final   Enterobacteriaceae species NOT DETECTED NOT DETECTED Final   Enterobacter cloacae complex NOT DETECTED NOT DETECTED Final   Escherichia coli NOT DETECTED NOT DETECTED Final   Klebsiella oxytoca NOT DETECTED NOT DETECTED Final   Klebsiella pneumoniae NOT DETECTED NOT DETECTED Final   Proteus species NOT DETECTED NOT DETECTED Final   Serratia marcescens NOT DETECTED NOT DETECTED Final   Haemophilus influenzae NOT DETECTED NOT DETECTED Final   Neisseria meningitidis NOT DETECTED NOT DETECTED Final   Pseudomonas aeruginosa NOT DETECTED NOT DETECTED Final   Candida albicans NOT DETECTED NOT DETECTED Final   Candida glabrata NOT DETECTED NOT DETECTED Final   Candida krusei NOT DETECTED NOT DETECTED Final   Candida parapsilosis NOT DETECTED NOT DETECTED Final   Candida tropicalis NOT DETECTED NOT DETECTED Final    Comment: Performed at [facility], [place] [address] [place], [place] [address]  MRSA PCR Screening     Status: None   Collection Time: 08/28/19  7:54 AM   Specimen: Nasal Mucosa; Nasopharyngeal  Result Value Ref Range Status   MRSA by PCR NEGATIVE NEGATIVE Final    Comment:        The GeneXpert MRSA Assay (FDA approved for NASAL specimens only), is one component of a comprehensive MRSA colonization surveillance program. It is not intended to diagnose MRSA infection nor to guide or monitor treatment for MRSA infections. Performed at [facility], [place] [address] [place], [place] [address]   Culture, blood (routine x 2)     Status: None (Preliminary result)   Collection Time: 08/29/19  5:48 AM   Specimen: BLOOD  Result Value Ref Range Status   Specimen Description BLOOD RIGHT HAND  Final   Special Requests   Final     BOTTLES DRAWN AEROBIC ONLY Blood Culture adequate volume   Culture   Final    NO GROWTH 3 DAYS Performed at [facility], [place] [address], [place], [place] [address]    Report Status PENDING  Incomplete  Culture, blood (routine x 2)     Status: None (Preliminary result)   Collection Time: 08/29/19  5:51 AM   Specimen: BLOOD  Result Value Ref Range Status   Specimen Description BLOOD LEFT ARM  Final   Special Requests   Final    BOTTLES DRAWN AEROBIC AND ANAEROBIC Blood Culture adequate volume   Culture   Final    NO GROWTH 3 DAYS Performed at [facility], [place] [address] [place], [place] [address]    Report Status PENDING  Incomplete  Surgical PCR screen     Status: None   Collection Time: 08/31/19  1:47 PM   Specimen: Nasal Mucosa; Nasal Swab  Result Value Ref Range Status   MRSA, PCR NEGATIVE NEGATIVE Final   Staphylococcus aureus NEGATIVE NEGATIVE Final    Comment: (NOTE) The Xpert SA Assay (FDA approved for NASAL specimens in patients 43 years of age and older), is one component of a comprehensive surveillance program. It is not intended to diagnose infection nor to guide or monitor treatment. Performed at [facility], [place] [address], [place], [place] [address]   Aerobic/Anaerobic Culture (surgical/deep wound)     Status: None (Preliminary result)   Collection Time: 08/31/19  3:24 PM   Specimen: Other  Source; Body Fluid  Result Value Ref Range Status   Specimen Description JOINT FLUID  Final   Special Requests LEFT STERNOCLAVICULAR  Final   Gram Stain   Final    ABUNDANT WBC PRESENT,BOTH PMN AND MONONUCLEAR FEW GRAM POSITIVE COCCI Performed at [facility], [place] [address] [place], [place] [address]    Culture FEW STAPHYLOCOCCUS AUREUS  Final   Report Status PENDING  Incomplete  Acid Fast Smear (AFB)     Status: None   Collection Time: 08/31/19  3:24 PM   Specimen: Other Source; Body Fluid  Result Value Ref Range Status   AFB Specimen  Processing Concentration  Final   Acid Fast Smear Negative  Final    Comment: (NOTE) Performed At: [facility] [place], [place] [id number] Rush Farmer MD AC:[id number]    Source (AFB) JOINT FLUID  Final    Comment: STERNOCLAVICULAR Performed at [facility], [place] [address], [place], [place] [address]     [name], MSN, NP-C [facility] [email or web address] Pager: [phone] Office: [phone] [place]: [phone]

## 2019-09-01 NOTE — Progress Notes (Signed)
[address]       [place] [address]             [phone]                 1 Day Post-Op Procedure(s) (LRB): LEFT STERNOCLAVICULAR WOUND DEBRIDEMENT WITH APPLICATION OF WOUND VAC (Left)  LOS: 5 days   Subjective: Feels better less pain in [place] joint area  Objective: Vital signs in last 24 hours: Patient Vitals for the past 24 hrs:  BP Temp Temp src Pulse Resp SpO2  09/01/19 0757 - - - - - 94 %  09/01/19 0749 - 98.2 F (36.8 C) Axillary - - -  09/01/19 0420 125/69 - - (!) 58 19 (!) 89 %  09/01/19 0020 111/66 - - 72 13 (!) 84 %  08/31/19 2053 - - - - - 91 %  08/31/19 2035 - 98 F (36.7 C) - - - -  08/31/19 1720 125/73 98.5 F (36.9 C) Oral 92 18 91 %  08/31/19 1700 - 98.1 F (36.7 C) - - - -  08/31/19 1650 132/71 - - 93 20 94 %  08/31/19 1635 129/74 - - 97 11 93 %  08/31/19 1620 124/72 - - 93 12 95 %  08/31/19 1605 134/74 98.4 F (36.9 C) - 92 14 98 %  08/31/19 1251 133/74 - - 84 (!) 21 94 %  08/31/19 1227 121/80 97.7 F (36.5 C) Oral 82 16 93 %    Filed Weights   08/29/19 0500 08/30/19 0416  Weight: 118 kg 117.9 kg    Hemodynamic parameters for last 24 hours:    Intake/Output from previous day: 09/01 0701 - 09/02 0700 In: 800 [I.V.:600; IV Piggyback:200] Out: 1010 [Urine:1000; Blood:10] Intake/Output this shift: No intake/output data recorded.  Scheduled Meds: . acetaminophen  325 mg Oral Once  . atorvastatin  80 mg Oral QHS  . brimonidine  1 drop Both Eyes TID  . dofetilide  500 mcg Oral BID  . insulin aspart  0-15 Units Subcutaneous TID WC  . insulin aspart  0-5 Units Subcutaneous QHS  . ipratropium-albuterol  3 mL Nebulization TID  . lidocaine (PF)  5 mL Intradermal Once  . loratadine  10 mg Oral Daily  . LORazepam  1 mg Intravenous Once  . mupirocin ointment  1 application Nasal BID  . pantoprazole  40 mg Oral Daily  . polyethylene glycol  17 g Oral BID  . potassium chloride  40 mEq Oral TID  . senna-docusate  1 tablet  Oral BID   Continuous Infusions: . magnesium sulfate bolus IVPB    . nafcillin IV 2 g (09/01/19 0420)   PRN Meds:.acetaminophen **OR** acetaminophen, albuterol, bisacodyl, cyclobenzaprine, diphenhydrAMINE, fluticasone furoate-vilanterol, morphine injection, ondansetron **OR** ondansetron (ZOFRAN) IV, oxyCODONE-acetaminophen, sodium phosphate  General appearance: alert, cooperative and appears older than stated age Neurologic: intact Heart: regular rate and rhythm, S1, S2 normal, no murmur, click, rub or gallop Lungs: diminished breath sounds bibasilar Abdomen: soft, non-tender; bowel sounds normal; no masses,  no organomegaly Wound: wound vac functioning   Lab Results: CBC: Recent Labs    08/31/19 1053 09/01/19 0252  WBC 10.7* 12.3*  HGB 15.8 15.4  HCT 45.3 46.0  PLT 88* 101*   BMET:  Recent Labs    08/31/19 1053 09/01/19 0252  NA 133* 135  K 3.6 4.3  CL 97* 97*  CO2 24 27  GLUCOSE 195* 181*  BUN 17 17  CREATININE  0.79 0.99  CALCIUM 9.0 9.2    PT/INR:  Recent Labs    08/31/19 1328  LABPROT 14.7  INR 1.2   Recent Results (from the past 720 hour(s))  Culture, blood (routine x 2)     Status: Abnormal   Collection Time: 08/27/19  8:54 AM   Specimen: BLOOD  Result Value Ref Range Status   Specimen Description BLOOD RIGHT ANTECUBITAL  Final   Special Requests   Final    BOTTLES DRAWN AEROBIC ONLY Blood Culture adequate volume   Culture  Setup Time   Final    GRAM POSITIVE COCCI IN CLUSTERS AEROBIC BOTTLE ONLY CRITICAL RESULT CALLED TO, READ BACK BY AND VERIFIED WITH: G. ABBOTT,PHARMD 0217 08/28/2019 T. TYSOR    Culture (A)  Final    STAPHYLOCOCCUS AUREUS SUSCEPTIBILITIES PERFORMED ON PREVIOUS CULTURE WITHIN THE LAST 5 DAYS. Performed at [facility], [place] [address], [place], [place] [address]    Report Status 08/29/2019 FINAL  Final  SARS CORONAVIRUS 2 (TAT 6-12 HRS) Nasal Swab Aptima Multi Swab     Status: None   Collection Time: 08/27/19  9:09 AM    Specimen: Aptima Multi Swab; Nasal Swab  Result Value Ref Range Status   SARS Coronavirus 2 NEGATIVE NEGATIVE Final    Comment: (NOTE) SARS-CoV-2 target nucleic acids are NOT DETECTED. The SARS-CoV-2 RNA is generally detectable in upper and lower respiratory specimens during the acute phase of infection. Negative results do not preclude SARS-CoV-2 infection, do not rule out co-infections with other pathogens, and should not be used as the sole basis for treatment or other patient management decisions. Negative results must be combined with clinical observations, patient history, and epidemiological information. The expected result is Negative. Fact Sheet for Patients: SugarRoll.be Fact Sheet for Healthcare Providers: https://www.woods-mathews.com/ This test is not yet approved or cleared by the [place] FDA and  has been authorized for detection and/or diagnosis of SARS-CoV-2 by FDA under an Emergency Use Authorization (EUA). This EUA will remain  in effect (meaning this test can be used) for the duration of the COVID-19 declaration under Section 56 4(b)(1) of the Act, 21 U.S.C. section 360bbb-3(b)(1), unless the authorization is terminated or revoked sooner. Performed at [facility], [place] [address] [place], [place] [address]   Culture, blood (routine x 2)     Status: Abnormal   Collection Time: 08/27/19  9:51 AM   Specimen: BLOOD RIGHT ARM  Result Value Ref Range Status   Specimen Description BLOOD RIGHT ARM  Final   Special Requests   Final    BOTTLES DRAWN AEROBIC AND ANAEROBIC Blood Culture adequate volume   Culture  Setup Time   Final    GRAM POSITIVE COCCI IN CLUSTERS IN BOTH AEROBIC AND ANAEROBIC BOTTLES CRITICAL RESULT CALLED TO, READ BACK BY AND VERIFIED WITH: G. ABBOTT,PHARMD 0217 08/28/2019 Mena Goes Performed at [facility], [place] [address], [place], [place] [address]    Culture STAPHYLOCOCCUS AUREUS (A)   Final   Report Status 08/29/2019 FINAL  Final   Organism ID, Bacteria STAPHYLOCOCCUS AUREUS  Final      Susceptibility   Staphylococcus aureus - MIC*    CIPROFLOXACIN <=0.5 SENSITIVE Sensitive     ERYTHROMYCIN <=0.25 SENSITIVE Sensitive     GENTAMICIN <=0.5 SENSITIVE Sensitive     OXACILLIN <=0.25 SENSITIVE Sensitive     TETRACYCLINE <=1 SENSITIVE Sensitive     VANCOMYCIN <=0.5 SENSITIVE Sensitive     TRIMETH/SULFA <=10 SENSITIVE Sensitive  CLINDAMYCIN <=0.25 SENSITIVE Sensitive     RIFAMPIN <=0.5 SENSITIVE Sensitive     Inducible Clindamycin NEGATIVE Sensitive     * STAPHYLOCOCCUS AUREUS  Blood Culture ID Panel (Reflexed)     Status: Abnormal   Collection Time: 08/27/19  9:51 AM  Result Value Ref Range Status   Enterococcus species NOT DETECTED NOT DETECTED Final   Listeria monocytogenes NOT DETECTED NOT DETECTED Final   Staphylococcus species DETECTED (A) NOT DETECTED Final    Comment: CRITICAL RESULT CALLED TO, READ BACK BY AND VERIFIED WITH: G. ABBOTT,PHARMD 0217 08/28/2019 T. TYSOR    Staphylococcus aureus (BCID) DETECTED (A) NOT DETECTED Final    Comment: Methicillin (oxacillin) susceptible Staphylococcus aureus (MSSA). Preferred therapy is anti staphylococcal beta lactam antibiotic (Cefazolin or Nafcillin), unless clinically contraindicated. CRITICAL RESULT CALLED TO, READ BACK BY AND VERIFIED WITH: G. ABBOTT,PHARMD 0217 08/28/2019 T. TYSOR    Methicillin resistance NOT DETECTED NOT DETECTED Final   Streptococcus species NOT DETECTED NOT DETECTED Final   Streptococcus agalactiae NOT DETECTED NOT DETECTED Final   Streptococcus pneumoniae NOT DETECTED NOT DETECTED Final   Streptococcus pyogenes NOT DETECTED NOT DETECTED Final   Acinetobacter baumannii NOT DETECTED NOT DETECTED Final   Enterobacteriaceae species NOT DETECTED NOT DETECTED Final   Enterobacter cloacae complex NOT DETECTED NOT DETECTED Final   Escherichia coli NOT DETECTED NOT DETECTED Final   Klebsiella  oxytoca NOT DETECTED NOT DETECTED Final   Klebsiella pneumoniae NOT DETECTED NOT DETECTED Final   Proteus species NOT DETECTED NOT DETECTED Final   Serratia marcescens NOT DETECTED NOT DETECTED Final   Haemophilus influenzae NOT DETECTED NOT DETECTED Final   Neisseria meningitidis NOT DETECTED NOT DETECTED Final   Pseudomonas aeruginosa NOT DETECTED NOT DETECTED Final   Candida albicans NOT DETECTED NOT DETECTED Final   Candida glabrata NOT DETECTED NOT DETECTED Final   Candida krusei NOT DETECTED NOT DETECTED Final   Candida parapsilosis NOT DETECTED NOT DETECTED Final   Candida tropicalis NOT DETECTED NOT DETECTED Final    Comment: Performed at [facility], [place], [place], [place] [address]  MRSA PCR Screening     Status: None   Collection Time: 08/28/19  7:54 AM   Specimen: Nasal Mucosa; Nasopharyngeal  Result Value Ref Range Status   MRSA by PCR NEGATIVE NEGATIVE Final    Comment:        The GeneXpert MRSA Assay (FDA approved for NASAL specimens only), is one component of a comprehensive MRSA colonization surveillance program. It is not intended to diagnose MRSA infection nor to guide or monitor treatment for MRSA infections. Performed at [facility], [place] [address], [place], [place] [address]   Culture, blood (routine x 2)     Status: None (Preliminary result)   Collection Time: 08/29/19  5:48 AM   Specimen: BLOOD  Result Value Ref Range Status   Specimen Description BLOOD RIGHT HAND  Final   Special Requests   Final    BOTTLES DRAWN AEROBIC ONLY Blood Culture adequate volume   Culture   Final    NO GROWTH 2 DAYS Performed at [facility], [place] [address], [place], [place] [address]    Report Status PENDING  Incomplete  Culture, blood (routine x 2)     Status: None (Preliminary result)   Collection Time: 08/29/19  5:51 AM   Specimen: BLOOD  Result Value Ref Range Status   Specimen Description BLOOD LEFT ARM  Final   Special Requests  Final    BOTTLES DRAWN AEROBIC AND ANAEROBIC Blood Culture adequate volume   Culture   Final    NO GROWTH 2 DAYS Performed at [facility], [place] [address] [place], [place] [address]    Report Status PENDING  Incomplete  Surgical PCR screen     Status: None   Collection Time: 08/31/19  1:47 PM   Specimen: Nasal Mucosa; Nasal Swab  Result Value Ref Range Status   MRSA, PCR NEGATIVE NEGATIVE Final   Staphylococcus aureus NEGATIVE NEGATIVE Final    Comment: (NOTE) The Xpert SA Assay (FDA approved for NASAL specimens in patients 94 years of age and older), is one component of a comprehensive surveillance program. It is not intended to diagnose infection nor to guide or monitor treatment. Performed at [facility], [place] [address] [place], [place] [address]   Aerobic/Anaerobic Culture (surgical/deep wound)     Status: None (Preliminary result)   Collection Time: 08/31/19  3:24 PM   Specimen: Other Source; Body Fluid  Result Value Ref Range Status   Specimen Description JOINT FLUID  Final   Special Requests LEFT STERNOCLAVICULAR  Final   Gram Stain   Final    ABUNDANT WBC PRESENT,BOTH PMN AND MONONUCLEAR FEW GRAM POSITIVE COCCI Performed at [facility], [place] [address], [place], [place] [address]    Culture PENDING  Incomplete   Report Status PENDING  Incomplete    Radiology Mr Lorelee New Contrast  Addendum Date: 08/31/2019   ADDENDUM REPORT: 08/31/2019 11:31 ADDENDUM: Within the findings under bone/joint/cartilage section they should read that there is cortical irregularity seen at the LEFT distal clavicle at the sternoclavicular joint with joint fluid within the LEFT sternoclavicular joint. There is also mild joint space loss seen at the right sternoclavicular joint. Electronically Signed   By: Prudencio Pair M.D.   On: 08/31/2019 11:31   Result Date: 08/31/2019 CLINICAL DATA:  Question of dislocation of the sternoclavicular joint and suspected infection. EXAM: MR  STERNUM WITHOUT CONTRAST TECHNIQUE: Multiplanar, multisequence MR imaging of the sternum was performed. No intravenous contrast was administered. COMPARISON:  CT August 29, 2019 FINDINGS: Limited due to patient motion and technique. Bones/Joint/Cartilage There is cortical irregularity seen at the right distal clavicle at the sternoclavicular joint with subtle T1 hypointensity and T2 hyperintensity involving the articular surface of the distal clavicle. There is joint fluid seen within the right sternoclavicular joint. Significant surrounding soft tissue edema is noted. No fracture or dislocation is noted. There is mild joint space loss seen at the left sternoclavicular joint with marginal osteophyte formation. Ligaments Suboptimal due to technique. Muscles and Tendons Feathery increased STIR signal seen along the left pectoralis musculature anteriorly. Limited visualization of the tendons is seen. Soft tissues Significant soft tissue stranding and edema is seen along the left sternoclavicular joint and chest wall extending anteriorly. No loculated fluid collection is noted within the soft tissues. 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 Significant surrounding soft tissue edema and left pectoralis muscular edema No definite sternoclavicular dislocation. These results will be called to the ordering clinician or representative by the Radiologist Assistant, and communication documented in the PACS or zVision Dashboard. Electronically Signed: By: Prudencio Pair M.D. On: 08/31/2019 08:04   Dg Chest Port 1 View  Result Date: 09/01/2019 CLINICAL DATA:  Follow-up sternoclavicular joint debridement EXAM: PORTABLE CHEST 1 VIEW COMPARISON:  08/31/2019 FINDINGS: Cardiomegaly. Unchanged elevation of the left hemidiaphragm with  associated atelectasis or scarring. The visualized skeletal structures are unremarkable. IMPRESSION:  Cardiomegaly. Unchanged elevation of the left hemidiaphragm with associated atelectasis or scarring. No acute appearing airspace opacity. Electronically Signed   By: Eddie Candle M.D.   On: 09/01/2019 09:22   Dg Chest Port 1 View  Result Date: 08/31/2019 CLINICAL DATA:  Shortness of breath. S/p LEFT sternoclavicular joint debridement. EXAM: PORTABLE CHEST 1 VIEW COMPARISON:  08/19/2019 and prior radiographs FINDINGS: The cardiomediastinal silhouette is unremarkable. LEFT basilar atelectasis noted. There is no evidence of focal airspace disease, pulmonary edema, suspicious pulmonary nodule/mass, pleural effusion, or pneumothorax. No acute bony abnormalities are identified. IMPRESSION: LEFT basilar atelectasis. Electronically Signed   By: Margarette Canada M.D.   On: 08/31/2019 19:08     Assessment/Plan: S/P Procedure(s) (LRB): LEFT STERNOCLAVICULAR WOUND DEBRIDEMENT WITH APPLICATION OF WOUND VAC (Left) Continue wound vac FEW GRAM POSITIVE COCCI- on gram satin of left [place] joint- culture pending   Grace Isaac MD 09/01/2019 9:25 AM

## 2019-09-01 NOTE — Progress Notes (Signed)
Patient performed a NIF of >-40 and 3L on the VC with good effort.

## 2019-09-01 NOTE — Progress Notes (Signed)
Physical Therapy Treatment Patient Details Name: [name] [name]. MRN: [id number] DOB: 08/15/1951 Today's Date: 09/01/2019    History of Present Illness [name] [name]. is a 68 y.o. male with medical history significant of OSA on CPAP; perforated appy (2015); obesity; CAD s/p stents; HLD; DM; optic nerve neuritis; and afib on Pradaxa presenting with back pain. L [place] joint I&D and wound vac placement 08/31/19.     PT Comments    Patient seen for mobility progression. Pt requires max A +2 for bed mobility and sitting EOB. Pt with c/o significant low back pain in sitting and unable to progress to transfer training this session. Continue to progress as tolerated.   Follow Up Recommendations  CIR     Equipment Recommendations  Other (comment)(TBD next venue)    Recommendations for Other Services       Precautions / Restrictions Precautions Precautions: Fall Precaution Comments: wound vac L [place] joint    Mobility  Bed Mobility Overal bed mobility: Needs Assistance Bed Mobility: Sit to Sidelying;Supine to Sit     Supine to sit: Max assist;+2 for physical assistance;Total assist Sit to supine: Max assist;+2 for physical assistance Sit to sidelying: Max assist;+2 for physical assistance General bed mobility comments: cues for sequencing and assist at bilat LE, hips, and trunk to elevate into sitting  Transfers                 General transfer comment: deferred due to pt's pain level in sitting and pt declined attempt  Ambulation/Gait                 Stairs             Wheelchair Mobility    Modified Rankin (Stroke Patients Only)       Balance Overall balance assessment: Needs assistance Sitting-balance support: Feet supported;Bilateral upper extremity supported Sitting balance-Leahy Scale: Zero Sitting balance - Comments: +2 assist to maintain sitting balance EOB for <10 minutes Postural control: Posterior lean                                   Cognition Arousal/Alertness: Awake/alert Behavior During Therapy: WFL for tasks assessed/performed Overall Cognitive Status: Within Functional Limits for tasks assessed                                 General Comments: seems a little groggy--sedated for MRI prior to therapy arrival      Exercises      General Comments        Pertinent Vitals/Pain Pain Assessment: Faces Faces Pain Scale: Hurts whole lot Pain Location: lower back Pain Descriptors / Indicators: Discomfort;Grimacing;Guarding Pain Intervention(s): Limited activity within patient's tolerance;Monitored during session;Repositioned    Home Living                      Prior Function            PT Goals (current goals can now be found in the care plan section) Acute Rehab PT Goals Patient Stated Goal: to decrease pain Progress towards PT goals: Progressing toward goals    Frequency    Min 3X/week      PT Plan Current plan remains appropriate    Co-evaluation PT/OT/SLP Co-Evaluation/Treatment: Yes Reason for Co-Treatment: Complexity of the patient's impairments (multi-system involvement);For patient/therapist safety;To address functional/ADL  transfers PT goals addressed during session: Mobility/safety with mobility OT goals addressed during session: (functional mobility)      AM-PAC PT "6 Clicks" Mobility   Outcome Measure  Help needed turning from your back to your side while in a flat bed without using bedrails?: A Lot Help needed moving from lying on your back to sitting on the side of a flat bed without using bedrails?: A Lot Help needed moving to and from a bed to a chair (including a wheelchair)?: Total Help needed standing up from a chair using your arms (e.g., wheelchair or bedside chair)?: Total Help needed to walk in hospital room?: Total Help needed climbing 3-5 steps with a railing? : Total 6 Click Score: 8    End of Session Equipment Utilized  During Treatment: Oxygen Activity Tolerance: Patient limited by pain Patient left: in bed;with call bell/phone within reach;with bed alarm set Nurse Communication: Mobility status PT Visit Diagnosis: Muscle weakness (generalized) (M62.81);Other abnormalities of gait and mobility (R26.89);Pain;Difficulty in walking, not elsewhere classified (R26.2) Pain - Right/Left: (low backe, bilat shoulders) Pain - part of body: Shoulder     Time: [id number] PT Time Calculation (min) (ACUTE ONLY): 30 min  Charges:  $Therapeutic Activity: 8-22 mins                     Earney Navy, PTA Acute Rehabilitation Services Pager: [phone] Office: [phone]     Darliss Cheney 09/01/2019, 4:42 PM

## 2019-09-01 NOTE — Progress Notes (Signed)
Patient ID: [name] Bae., male   DOB: Nov 07, 1951, 68 y.o.   MRN: [id number]  PROGRESS NOTE    [name] Chimes.  [id number] DOB: 07/17/1951 DOA: 08/27/2019 PCP: Burnis Medin, MD   Brief Narrative:  68 year old male with history of OSA on CPAP, perforated appendicitis in 2015, CAD status post stents, hyperlipidemia, diabetes mellitus type 2, optic neuritis with recent hospitalization at [facility] treated with intravenous and subsequent oral steroids, recent right lower extremity cellulitis treated as an outpatient with oral doxycycline, A. fib on Pradaxa presented with back pain and progressively worsening difficulty walking.  Neurology was consulted.  He was found to have MSSA bacteremia with question of L4-L5 discitis/osteomyelitis.  He was started on Ancef.  ID was consulted.  Assessment & Plan: MSSA bacteremia Currently afebrile, with leukocytosis Source likely L4-L5 discitis/osteomyelitis, septic arthritis/possible osteomyelitis of the sternoclavicular joint space Repeat blood culture from 08/29/2019 has been negative for 3 days CRP 6.1 Echo showed no valvular vegetations Neurosurgery/Dr. Annette Stable was consulted by phone on 08/28/2019 and he reviewed the MRI lumbar spine and recommended no surgical intervention at this time ID on board, recommend changing to nafcillin due to possible CNS involvement, will need about 2 to 3 weeks, with possible de-escalation back to cefazolin if not tolerated.  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  Neurology is not convinced about this for now. CTA of the head and neck showed no evidence of CNS vasculitis Neurology consulted, appreciate further recs  Thrombocytopenia Unclear etiology, no signs of bleeding/bruising Daily CBC  Paroxysmal A. Fib Rate controlled Continue Tikosyn, check magnesium daily Continue Pradaxa  Diabetes mellitus type 2 A1c 6.7 on 07/21/2019 SSI, Accu-Cheks, hypoglycemic protocol  Hyperlipidemia Continue Lipitor  OSA Continue CPAP at bedside  Obesity Lifestyle modification advised  Generalized deconditioning/weakness PT/OT on board    DVT prophylaxis: Pradaxa Code Status: Full Family Communication: None at bedside Disposition Plan: To be determined   Consultants Neurology Orthopedics ID IR Spoke to neurosurgery/Dr. Annette Stable on 08/28/2019.  Procedures:  Echo IMPRESSIONS    1. The left ventricle has normal systolic function with an ejection fraction of 60-65%. The cavity size was normal. There is mildly increased left ventricular wall thickness. Left ventricular diastolic Doppler parameters are consistent with impaired  relaxation. Indeterminate filling pressures The E/e' is 8-15. No evidence of left ventricular regional wall motion abnormalities.  2. The right ventricle has normal systolic function. The cavity was normal. There is no increase in right ventricular wall thickness.  3. Left atrial size was mildly dilated.  4. The mitral valve is abnormal. Mild  thickening of the mitral valve leaflet. No mitral valve vegetation visualized.  5. The tricuspid valve is grossly normal.  6. No vegetation on the aortic valve.  7. The aortic valve is tricuspid. Mild sclerosis of the aortic valve. Aortic valve regurgitation is trivial by color flow Doppler. No stenosis of the aortic valve.  8. The aorta is normal unless otherwise noted.  SUMMARY   LVEF 60-65%, mild LVH, normal wall motion, grade 1 DD, indeterminate LV filling pressure, normal RV function, mild LAE, aortic valve sclerosis with trivial AI, no valvular vegetations  Antimicrobials: Nafcillin   Subjective: Patient seen and examined at bedside, reports left shoulder pain has improved since after the I&D.  Denies any new complaints.  Stated no BM for the past couple of weeks   Objective: Vitals:   09/01/19 0749 09/01/19 0757 09/01/19 1444 09/01/19 1510  BP:      Pulse:      Resp:   16   Temp: 98.2 F (36.8 C)  98.1 F (36.7 C)   TempSrc: Axillary  Axillary   SpO2:  94%  92%  Weight:      Height:        Intake/Output Summary (Last 24 hours) at 09/01/2019 1513 Last data filed at 09/01/2019 0700 Gross per 24 hour  Intake 800 ml  Output 1010 ml  Net -210 ml   Filed Weights   08/29/19 0500 08/30/19 0416  Weight: 118 kg 117.9 kg    Examination:  General: NAD  Cardiovascular: S1, S2 present  Respiratory:  Mild bibasilar crackles noted  Abdomen: Soft, nontender, nondistended, bowel sounds present  Musculoskeletal: Trace bilateral pedal edema noted  Skin: Normal  Psychiatry: Normal mood   Data Reviewed: I have personally reviewed following labs and imaging studies  CBC: Recent Labs  Lab 08/27/19 0908 08/28/19 0415 08/29/19 0548 08/30/19 0347 08/31/19 1053 09/01/19 0252  WBC 12.0* 15.8* 12.9* 12.3* 10.7* 12.3*  NEUTROABS 9.8*  --  9.6* 8.6* 7.6 9.9*  HGB 19.4* 17.9* 16.0 15.5 15.8 15.4  HCT 56.4* 51.2 46.7 45.5 45.3 46.0  MCV 89.7 89.5 90.2 90.6 89.0 90.6   PLT 103* 91* 73* 76* 88* [id number]*   Basic Metabolic Panel: Recent Labs  Lab 08/28/19 0415 08/29/19 0548 08/30/19 0347 08/31/19 1053 09/01/19 0252  NA 131* 134* 132* 133* 135  K 4.4 4.0 4.3 3.6 4.3  CL 97* 100 100 97* 97*  CO2 21* 24 23 24 27   GLUCOSE 230* 158* 141* 195* 181*  BUN 37* 30* 24* 17 17  CREATININE 1.48* 0.97 0.87 0.79 0.99  CALCIUM 9.3 9.2 9.0 9.0 9.2  MG  --  2.0 1.8 1.8 1.9   GFR: Estimated Creatinine Clearance: 94.7 mL/min (by C-G formula based on SCr of 0.99 mg/dL). Liver Function Tests: Recent Labs  Lab 08/27/19 0908 08/28/19 0415 08/29/19 0548  AST 20 22 24   ALT 48* 40 40  ALKPHOS 103 112 92  BILITOT 1.7* 1.8* 1.1  PROT 7.5 6.1* 5.4*  ALBUMIN 3.5 2.9* 2.4*   No results for input(s): LIPASE, AMYLASE in the last 168 hours. No results for input(s): AMMONIA in the last 168 hours. Coagulation Profile: Recent Labs  Lab 08/27/19 1531 08/31/19 1328  INR 1.1 1.2   Cardiac Enzymes: Recent Labs  Lab 08/27/19 1705  CKTOTAL 30*   BNP (last 3 results) No results for input(s): PROBNP in the last 8760 hours. HbA1C: No results for input(s): HGBA1C in the last 72 hours. CBG: Recent Labs  Lab 08/31/19 1345 08/31/19 1607 08/31/19 2219 09/01/19 0748 09/01/19 1440  GLUCAP 146* 154* 226* 136* 132*   Lipid Profile: No results for input(s): CHOL, HDL, LDLCALC, TRIG,  CHOLHDL, LDLDIRECT in the last 72 hours. Thyroid Function Tests: No results for input(s): TSH, T4TOTAL, FREET4, T3FREE, THYROIDAB in the last 72 hours. Anemia Panel: No results for input(s): VITAMINB12, FOLATE, FERRITIN, TIBC, IRON, RETICCTPCT in the last 72 hours. Sepsis Labs: No results for input(s): PROCALCITON, LATICACIDVEN in the last 168 hours.  Recent Results (from the past 240 hour(s))  Culture, blood (routine x 2)     Status: Abnormal   Collection Time: 08/27/19  8:54 AM   Specimen: BLOOD  Result Value Ref Range Status   Specimen Description BLOOD RIGHT ANTECUBITAL  Final    Special Requests   Final    BOTTLES DRAWN AEROBIC ONLY Blood Culture adequate volume   Culture  Setup Time   Final    GRAM POSITIVE COCCI IN CLUSTERS AEROBIC BOTTLE ONLY CRITICAL RESULT CALLED TO, READ BACK BY AND VERIFIED WITH: G. ABBOTT,PHARMD 0217 08/28/2019 T. TYSOR    Culture (A)  Final    STAPHYLOCOCCUS AUREUS SUSCEPTIBILITIES PERFORMED ON PREVIOUS CULTURE WITHIN THE LAST 5 DAYS. Performed at [facility], [place] [address] [place], [place] [address]    Report Status 08/29/2019 FINAL  Final  SARS CORONAVIRUS 2 (TAT 6-12 HRS) Nasal Swab Aptima Multi Swab     Status: None   Collection Time: 08/27/19  9:09 AM   Specimen: Aptima Multi Swab; Nasal Swab  Result Value Ref Range Status   SARS Coronavirus 2 NEGATIVE NEGATIVE Final    Comment: (NOTE) SARS-CoV-2 target nucleic acids are NOT DETECTED. The SARS-CoV-2 RNA is generally detectable in upper and lower respiratory specimens during the acute phase of infection. Negative results do not preclude SARS-CoV-2 infection, do not rule out co-infections with other pathogens, and should not be used as the sole basis for treatment or other patient management decisions. Negative results must be combined with clinical observations, patient history, and epidemiological information. The expected result is Negative. Fact Sheet for Patients: SugarRoll.be Fact Sheet for Healthcare Providers: https://www.woods-mathews.com/ This test is not yet approved or cleared by the [place] FDA and  has been authorized for detection and/or diagnosis of SARS-CoV-2 by FDA under an Emergency Use Authorization (EUA). This EUA will remain  in effect (meaning this test can be used) for the duration of the COVID-19 declaration under Section 56 4(b)(1) of the Act, 21 U.S.C. section 360bbb-3(b)(1), unless the authorization is terminated or revoked sooner. Performed at [facility], [place] [address],  [place], [place] [address]   Culture, blood (routine x 2)     Status: Abnormal   Collection Time: 08/27/19  9:51 AM   Specimen: BLOOD RIGHT ARM  Result Value Ref Range Status   Specimen Description BLOOD RIGHT ARM  Final   Special Requests   Final    BOTTLES DRAWN AEROBIC AND ANAEROBIC Blood Culture adequate volume   Culture  Setup Time   Final    GRAM POSITIVE COCCI IN CLUSTERS IN BOTH AEROBIC AND ANAEROBIC BOTTLES CRITICAL RESULT CALLED TO, READ BACK BY AND VERIFIED WITH: G. ABBOTT,PHARMD 0217 08/28/2019 [name] [name] Performed at [facility], [place] [address], [place],  [address]    Culture STAPHYLOCOCCUS AUREUS (A)  Final   Report Status 08/29/2019 FINAL  Final   Organism ID, Bacteria STAPHYLOCOCCUS AUREUS  Final      Susceptibility   Staphylococcus aureus - MIC*    CIPROFLOXACIN <=0.5 SENSITIVE Sensitive     ERYTHROMYCIN <=0.25 SENSITIVE Sensitive     GENTAMICIN <=0.5 SENSITIVE Sensitive  OXACILLIN <=0.25 SENSITIVE Sensitive     TETRACYCLINE <=1 SENSITIVE Sensitive     VANCOMYCIN <=0.5 SENSITIVE Sensitive     TRIMETH/SULFA <=10 SENSITIVE Sensitive     CLINDAMYCIN <=0.25 SENSITIVE Sensitive     RIFAMPIN <=0.5 SENSITIVE Sensitive     Inducible Clindamycin NEGATIVE Sensitive     * STAPHYLOCOCCUS AUREUS  Blood Culture ID Panel (Reflexed)     Status: Abnormal   Collection Time: 08/27/19  9:51 AM  Result Value Ref Range Status   Enterococcus species NOT DETECTED NOT DETECTED Final   Listeria monocytogenes NOT DETECTED NOT DETECTED Final   Staphylococcus species DETECTED (A) NOT DETECTED Final    Comment: CRITICAL RESULT CALLED TO, READ BACK BY AND VERIFIED WITH: G. ABBOTT,PHARMD 0217 08/28/2019 T. TYSOR    Staphylococcus aureus (BCID) DETECTED (A) NOT DETECTED Final    Comment: Methicillin (oxacillin) susceptible Staphylococcus aureus (MSSA). Preferred therapy is anti staphylococcal beta lactam antibiotic (Cefazolin or Nafcillin), unless clinically  contraindicated. CRITICAL RESULT CALLED TO, READ BACK BY AND VERIFIED WITH: G. ABBOTT,PHARMD 0217 08/28/2019 T. TYSOR    Methicillin resistance NOT DETECTED NOT DETECTED Final   Streptococcus species NOT DETECTED NOT DETECTED Final   Streptococcus agalactiae NOT DETECTED NOT DETECTED Final   Streptococcus pneumoniae NOT DETECTED NOT DETECTED Final   Streptococcus pyogenes NOT DETECTED NOT DETECTED Final   Acinetobacter baumannii NOT DETECTED NOT DETECTED Final   Enterobacteriaceae species NOT DETECTED NOT DETECTED Final   Enterobacter cloacae complex NOT DETECTED NOT DETECTED Final   Escherichia coli NOT DETECTED NOT DETECTED Final   Klebsiella oxytoca NOT DETECTED NOT DETECTED Final   Klebsiella pneumoniae NOT DETECTED NOT DETECTED Final   Proteus species NOT DETECTED NOT DETECTED Final   Serratia marcescens NOT DETECTED NOT DETECTED Final   Haemophilus influenzae NOT DETECTED NOT DETECTED Final   Neisseria meningitidis NOT DETECTED NOT DETECTED Final   Pseudomonas aeruginosa NOT DETECTED NOT DETECTED Final   Candida albicans NOT DETECTED NOT DETECTED Final   Candida glabrata NOT DETECTED NOT DETECTED Final   Candida krusei NOT DETECTED NOT DETECTED Final   Candida parapsilosis NOT DETECTED NOT DETECTED Final   Candida tropicalis NOT DETECTED NOT DETECTED Final    Comment: Performed at [facility], [place]. [address], [place], [place] [address]  MRSA PCR Screening     Status: None   Collection Time: 08/28/19  7:54 AM   Specimen: Nasal Mucosa; Nasopharyngeal  Result Value Ref Range Status   MRSA by PCR NEGATIVE NEGATIVE Final    Comment:        The GeneXpert MRSA Assay (FDA approved for NASAL specimens only), is one component of a comprehensive MRSA colonization surveillance program. It is not intended to diagnose MRSA infection nor to guide or monitor treatment for MRSA infections. Performed at [facility], [place] [address] [place], [place] [address]   Culture,  blood (routine x 2)     Status: None (Preliminary result)   Collection Time: 08/29/19  5:48 AM   Specimen: BLOOD  Result Value Ref Range Status   Specimen Description BLOOD RIGHT HAND  Final   Special Requests   Final    BOTTLES DRAWN AEROBIC ONLY Blood Culture adequate volume   Culture   Final    NO GROWTH 3 DAYS Performed at [facility], [place] [address] [place], [place] [address]    Report Status PENDING  Incomplete  Culture, blood (routine x 2)     Status: None (Preliminary result)  Collection Time: 08/29/19  5:51 AM   Specimen: BLOOD  Result Value Ref Range Status   Specimen Description BLOOD LEFT ARM  Final   Special Requests   Final    BOTTLES DRAWN AEROBIC AND ANAEROBIC Blood Culture adequate volume   Culture   Final    NO GROWTH 3 DAYS Performed at [facility], [address] [place], [place] [address]    Report Status PENDING  Incomplete  Surgical PCR screen     Status: None   Collection Time: 08/31/19  1:47 PM   Specimen: Nasal Mucosa; Nasal Swab  Result Value Ref Range Status   MRSA, PCR NEGATIVE NEGATIVE Final   Staphylococcus aureus NEGATIVE NEGATIVE Final    Comment: (NOTE) The Xpert SA Assay (FDA approved for NASAL specimens in patients 36 years of age and older), is one component of a comprehensive surveillance program. It is not intended to diagnose infection nor to guide or monitor treatment. Performed at [facility], [place] [address] [place], [place] [address]   Aerobic/Anaerobic Culture (surgical/deep wound)     Status: None (Preliminary result)   Collection Time: 08/31/19  3:24 PM   Specimen: Other Source; Body Fluid  Result Value Ref Range Status   Specimen Description JOINT FLUID  Final   Special Requests LEFT STERNOCLAVICULAR  Final   Gram Stain   Final    ABUNDANT WBC PRESENT,BOTH PMN AND MONONUCLEAR FEW GRAM POSITIVE COCCI Performed at [facility], [place] [address] [place], [place] [address]    Culture FEW  STAPHYLOCOCCUS AUREUS  Final   Report Status PENDING  Incomplete         Radiology Studies: Mr Lorelee New Contrast  Addendum Date: 08/31/2019   ADDENDUM REPORT: 08/31/2019 11:31 ADDENDUM: Within the findings under bone/joint/cartilage section they should read that there is cortical irregularity seen at the LEFT distal clavicle at the sternoclavicular joint with joint fluid within the LEFT sternoclavicular joint. There is also mild joint space loss seen at the right sternoclavicular joint. Electronically Signed   By: Prudencio Pair M.D.   On: 08/31/2019 11:31   Result Date: 08/31/2019 CLINICAL DATA:  Question of dislocation of the sternoclavicular joint and suspected infection. EXAM: MR STERNUM WITHOUT CONTRAST TECHNIQUE: Multiplanar, multisequence MR imaging of the sternum was performed. No intravenous contrast was administered. COMPARISON:  CT August 29, 2019 FINDINGS: Limited due to patient motion and technique. Bones/Joint/Cartilage There is cortical irregularity seen at the right distal clavicle at the sternoclavicular joint with subtle T1 hypointensity and T2 hyperintensity involving the articular surface of the distal clavicle. There is joint fluid seen within the right sternoclavicular joint. Significant surrounding soft tissue edema is noted. No fracture or dislocation is noted. There is mild joint space loss seen at the left sternoclavicular joint with marginal osteophyte formation. Ligaments Suboptimal due to technique. Muscles and Tendons Fe[place]y increased STIR signal seen along the left pectoralis musculature anteriorly. Limited visualization of the tendons is seen. Soft tissues Significant soft tissue stranding and edema is seen along the left sternoclavicular joint and chest wall extending anteriorly. No loculated fluid collection is noted within the soft tissues. IMPRESSION: Findings suggestive septic left sternoclavicular joint with possible early osteomyelitis involving the distal  clavicle. Significant surrounding soft tissue edema and left pectoralis muscular edema No definite sternoclavicular dislocation. These results will be called to the ordering clinician or representative by the Radiologist Assistant, and communication documented in the PACS or zVision Dashboard. Electronically Signed: By: Bindu  Avutu M.D. On: 08/31/2019 08:04   Mr Shoulder Left W Wo Contrast  Result Date: 09/01/2019 CLINICAL DATA:  Left shoulder pain, bacteremia EXAM: MRI OF THE LEFT SHOULDER WITHOUT AND WITH CONTRAST TECHNIQUE: Multiplanar, multisequence MR imaging of the left shoulder was performed before and after the administration of intravenous contrast. CONTRAST:  8 mL Gadavist IV contrast COMPARISON:  None. FINDINGS: Rotator cuff: Mild bursal surface irregularity of the distal supraspinatus tendon. There is tendinosis supraspinatus, infraspinatus, and subscapularis tendons. No full-thickness tear. Teres minor intact. Muscles: No atrophy or abnormal signal of the muscles of the rotator cuff. Biceps long head: Biceps tendinosis with probable interstitial tear of the intra-articular portion (series 5, images 11-12). Acromioclavicular Joint: Mild-moderate arthropathy of the acromioclavicular joint. No subacromial/subdeltoid bursal fluid. Glenohumeral Joint: No glenohumeral joint effusion. No pericapsular edema or enhancement. Diffuse cartilage thinning and surface irregularity. Labrum: Labral degeneration with probable tear of the anterior-inferior labrum (series 2, images 16-17). Bones: No fracture or dislocation. Reactive degenerative marrow changes centered at the [facility] joint. No osseous destruction. Other: None. IMPRESSION: 1. No MRI evidence of septic arthritis of the left shoulder, as clinically questioned. 2. Rotator cuff tendinosis with low-grade bursal surface irregularity of the distal supraspinatus tendon. No full-thickness rotator cuff tear. 3. Biceps tendinosis with probable interstitial tear of the  intra-articular portion. 4. 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 Electronically Signed   By: Davina Poke M.D.   On: 09/01/2019 14:28   Dg Chest Port 1 View  Result Date: 09/01/2019 CLINICAL DATA:  Follow-up sternoclavicular joint debridement EXAM: PORTABLE CHEST 1 VIEW COMPARISON:  08/31/2019 FINDINGS: Cardiomegaly. Unchanged elevation of the left hemidiaphragm with associated atelectasis or scarring. The visualized skeletal structures are unremarkable. IMPRESSION: Cardiomegaly. Unchanged elevation of the left hemidiaphragm with associated atelectasis or scarring. No acute appearing airspace opacity. Electronically Signed   By: Eddie Candle M.D.   On: 09/01/2019 09:22   Dg Chest Port 1 View  Result Date: 08/31/2019 CLINICAL DATA:  Shortness of breath. S/p LEFT sternoclavicular joint debridement. EXAM: PORTABLE CHEST 1 VIEW COMPARISON:  08/19/2019 and prior radiographs FINDINGS: The cardiomediastinal silhouette is unremarkable. LEFT basilar atelectasis noted. There is no evidence of focal airspace disease, pulmonary edema, suspicious pulmonary nodule/mass, pleural effusion, or pneumothorax. No acute bony abnormalities are identified. IMPRESSION: LEFT basilar atelectasis. Electronically Signed   By: Margarette Canada M.D.   On: 08/31/2019 19:08        Scheduled Meds: . acetaminophen  325 mg Oral Once  . atorvastatin  80 mg Oral QHS  . brimonidine  1 drop Both Eyes TID  . dofetilide  500 mcg Oral BID  . fluticasone furoate-vilanterol  1 puff Inhalation Daily  . insulin aspart  0-15 Units Subcutaneous TID WC  . insulin aspart  0-5 Units Subcutaneous QHS  . ipratropium-albuterol  3 mL Nebulization TID  . lidocaine (PF)  5 mL Intradermal Once  . loratadine  10 mg Oral Daily  . mupirocin ointment  1 application Nasal BID  . pantoprazole  40 mg Oral Daily  . polyethylene glycol  17 g Oral BID  . senna-docusate  1 tablet Oral BID   Continuous  Infusions: . magnesium sulfate bolus IVPB 2 g (09/01/19 1457)  . nafcillin IV 2 g (09/01/19 0927)     LOS: 5 days        Alma Friendly, MD [facility] 09/01/2019, 3:13 PM

## 2019-09-01 NOTE — Consult Note (Signed)
[place] Nurse wound consult note Patient receiving care in [facility] [phone]. Reason for Consult: [place] VAC change.  I communicated with Dr. Servando Snare via [place] and obtained permission to change [facility] dressing changes to MWF by the [facility] team, to begin 09/03/19.  Val Riles, RN, MSN, CWOCN, CNS-BC, pager [phone]

## 2019-09-01 NOTE — Care Management Important Message (Signed)
Important Message  Patient Details  Name: [name] [name]. MRN: [id number] Date of Birth: 03-Dec-1951   Medicare Important Message Given:  Yes     Joaquina Nissen 09/01/2019, 2:16 PM

## 2019-09-01 NOTE — Progress Notes (Signed)
Penicillin Allergy Update  Mr. Aylsworth underwent an oral challenge with amoxicillin yesterday which he tolerated well and he is currently receiving IV nafcillin with no complaints. I spoke with him today to reiterate that he is not penicillin allergic. I also gave him a wallet card that says this and that he received and tolerated an oral challenge here at [facility].  Jimmy Footman and I contacted his PCP office and [facility] pharmacy to notify them of his updated allergy status. It has also been removed from his medical record in [place].   Nicoletta Dress, PharmD PGY2 Infectious Disease Pharmacy Resident  [phone]

## 2019-09-01 NOTE — Procedures (Signed)
Patient performed a NIF of > -40 and 2.9L on the VC, both with good effort.

## 2019-09-02 ENCOUNTER — Inpatient Hospital Stay: Payer: Self-pay

## 2019-09-02 LAB — CBC WITH DIFFERENTIAL/PLATELET
Abs Immature Granulocytes: 0.15 10*3/uL — ABNORMAL HIGH (ref 0.00–0.07)
Basophils Absolute: 0 10*3/uL (ref 0.0–0.1)
Basophils Relative: 0 %
Eosinophils Absolute: 0.3 10*3/uL (ref 0.0–0.5)
Eosinophils Relative: 3 %
HCT: 46.1 % (ref 39.0–52.0)
Hemoglobin: 15.8 g/dL (ref 13.0–17.0)
Immature Granulocytes: 1 %
Lymphocytes Relative: 18 %
Lymphs Abs: 2.1 10*3/uL (ref 0.7–4.0)
MCH: 30.9 pg (ref 26.0–34.0)
MCHC: 34.3 g/dL (ref 30.0–36.0)
MCV: 90 fL (ref 80.0–100.0)
Monocytes Absolute: 1.6 10*3/uL — ABNORMAL HIGH (ref 0.1–1.0)
Monocytes Relative: 14 %
Neutro Abs: 7.3 10*3/uL (ref 1.7–7.7)
Neutrophils Relative %: 64 %
Platelets: 122 10*3/uL — ABNORMAL LOW (ref 150–400)
RBC: 5.12 MIL/uL (ref 4.22–5.81)
RDW: 13.7 % (ref 11.5–15.5)
WBC: 11.6 10*3/uL — ABNORMAL HIGH (ref 4.0–10.5)
nRBC: 0 % (ref 0.0–0.2)

## 2019-09-02 LAB — BASIC METABOLIC PANEL
Anion gap: 11 (ref 5–15)
BUN: 23 mg/dL (ref 8–23)
CO2: 26 mmol/L (ref 22–32)
Calcium: 9.2 mg/dL (ref 8.9–10.3)
Chloride: 100 mmol/L (ref 98–111)
Creatinine, Ser: 1.09 mg/dL (ref 0.61–1.24)
GFR calc Af Amer: >60 mL/min (ref >60)
GFR calc non Af Amer: >60 mL/min (ref >60)
Glucose, Bld: 153 mg/dL — ABNORMAL HIGH (ref 70–99)
Potassium: 3.7 mmol/L (ref 3.5–5.1)
Sodium: 137 mmol/L (ref 135–145)

## 2019-09-02 LAB — GLUCOSE, CAPILLARY
Glucose-Capillary: 151 mg/dL — ABNORMAL HIGH (ref 70–99)
Glucose-Capillary: 183 mg/dL — ABNORMAL HIGH (ref 70–99)
Glucose-Capillary: 136 mg/dL — ABNORMAL HIGH (ref 70–99)
Glucose-Capillary: 157 mg/dL — ABNORMAL HIGH (ref 70–99)

## 2019-09-02 LAB — MAGNESIUM: Magnesium: 2.1 mg/dL (ref 1.7–2.4)

## 2019-09-02 MED ORDER — SODIUM CHLORIDE 0.9% FLUSH
10.0000 mL | INTRAVENOUS | Status: DC | PRN
  Administered 2019-09-08: 10 mL
  Filled 2019-09-02: qty 40

## 2019-09-02 MED ORDER — POTASSIUM CHLORIDE CRYS ER 20 MEQ PO TBCR
20.0000 meq | EXTENDED_RELEASE_TABLET | Freq: Every day | ORAL | Status: DC
  Administered 2019-09-02 – 2019-09-05 (×4): 20 meq via ORAL
  Filled 2019-09-02 (×4): qty 1

## 2019-09-02 MED ORDER — CEFAZOLIN SODIUM-DEXTROSE 2-4 GM/100ML-% IV SOLN: 2.0000 g | Freq: Three times a day (TID) | INTRAVENOUS | Status: DC

## 2019-09-02 MED ORDER — SODIUM CHLORIDE 0.9% FLUSH
10.0000 mL | Freq: Two times a day (BID) | INTRAVENOUS | Status: DC
  Administered 2019-09-02 – 2019-09-03 (×3): 10 mL

## 2019-09-02 NOTE — Progress Notes (Signed)
Patient ID: [name] [name]., male   DOB: Mar 16, 1951, 68 y.o.   MRN: [id number]  PROGRESS NOTE    [name] [name].  [id number] DOB: 1951-08-20 DOA: 08/27/2019 PCP: [name] Medin, MD   Brief Narrative:  68 year old male with history of OSA on CPAP, perforated appendicitis in 2015, CAD status post stents, hyperlipidemia, diabetes mellitus type 2, optic neuritis with recent hospitalization at [facility] treated with intravenous and subsequent oral steroids, recent right lower extremity cellulitis treated as an outpatient with oral doxycycline, A. fib on Pradaxa presented with back pain and progressively worsening difficulty walking.  Neurology was consulted.  He was found to have MSSA bacteremia with question of L4-L5 discitis/osteomyelitis.  He was started on Ancef.  ID was consulted.  Assessment & Plan: MSSA bacteremia Currently afebrile, with leukocytosis Source likely L4-L5 discitis/osteomyelitis, septic arthritis/possible osteomyelitis of the sternoclavicular joint space Repeat blood culture from 08/29/2019 has been negative for 3 days CRP 6.1 Echo showed no valvular vegetations Neurosurgery/Dr. Annette Stable was consulted by phone on 08/28/2019 and he reviewed the MRI lumbar spine and recommended no surgical intervention at this time ID on board, recommend changing to nafcillin due to possible CNS involvement, will need 2 weeks, with de-escalation back to cefazolin for a total duration of IV antibiotics for 6 weeks given concern for associated discitis Continue IV nafcillin  Infected left sternoclavicular joint s/p I&D/debridement with application of wound VAC by CTS Surgical/deep tissue culture growing stap aureus  Cardiothoracic surgeons on board, appreciate recs Continue IV nafcillin  Left shoulder pain Improving MRI showed no evidence of septic arthritis, rotator cuff tendinosis, biceps tendinosis with probable interstitial tear of the intra-articular portion, 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  Neurology is not convinced about this for now. CTA of the head and neck showed no evidence of CNS vasculitis Neurology consulted, appreciate further recs  Thrombocytopenia Unclear etiology, no signs of bleeding/bruising Daily CBC  Paroxysmal A. Fib Rate controlled Continue Tikosyn, check magnesium daily Continue Pradaxa  Diabetes mellitus type 2 A1c 6.7 on 07/21/2019 SSI, Accu-Cheks, hypoglycemic protocol  Hyperlipidemia Continue Lipitor  OSA Continue CPAP at bedside  Obesity Lifestyle modification advised  Generalized deconditioning/weakness PT/OT on board    DVT prophylaxis: Pradaxa Code Status: Full Family Communication: None at bedside Disposition Plan: To be determined   Consultants Neurology Orthopedics ID IR Spoke to neurosurgery/Dr. Annette Stable on 08/28/2019.  Procedures:  Echo IMPRESSIONS    1. The left ventricle has normal systolic function with an ejection fraction of 60-65%. The cavity size was normal. There is mildly increased left ventricular wall thickness. Left ventricular diastolic Doppler parameters are consistent with impaired  relaxation. Indeterminate filling pressures The E/e' is 8-15. No evidence of left ventricular regional wall motion abnormalities.  2. The right ventricle has normal systolic function. The cavity was normal. There is no increase in right ventricular wall thickness.  3. Left atrial size was mildly dilated.  4. The mitral valve is abnormal. Mild thickening of the  mitral valve leaflet. No mitral valve vegetation visualized.  5. The tricuspid valve is grossly normal.  6. No vegetation on the aortic valve.  7. The aortic  valve is tricuspid. Mild sclerosis of the aortic valve. Aortic valve regurgitation is trivial by color flow Doppler. No stenosis of the aortic valve.  8. The aorta is normal unless otherwise noted.  SUMMARY   LVEF 60-65%, mild LVH, normal wall motion, grade 1 DD, indeterminate LV filling pressure, normal RV function, mild LAE, aortic valve sclerosis with trivial AI, no valvular vegetations  Antimicrobials: Nafcillin   Subjective: Today, patient denies any new complaints.  Reports feeling slightly better.   Objective: Vitals:   09/01/19 2302 09/02/19 0005 09/02/19 0405 09/02/19 0454  BP:  116/67 124/72   Pulse: 81 77 80   Resp: 16 16 18    Temp:  98 F (36.7 C)  98 F (36.7 C)  TempSrc:  Axillary    SpO2: 91% 92% 92%   Weight:      Height:        Intake/Output Summary (Last 24 hours) at 09/02/2019 1226 Last data filed at 09/02/2019 0600 Gross per 24 hour  Intake 240 ml  Output 650 ml  Net -410 ml   Filed Weights   08/29/19 0500 08/30/19 0416  Weight: 118 kg 117.9 kg    Examination:  General: NAD   Cardiovascular: S1, S2 present  Respiratory:  Mild bibasilar crackles noted  Abdomen: Soft, nontender, nondistended, bowel sounds present  Musculoskeletal: Trace bilateral pedal edema noted  Skin: Normal  Psychiatry: Normal mood   Data Reviewed: I have personally reviewed following labs and imaging studies  CBC: Recent Labs  Lab 08/29/19 0548 08/30/19 0347 08/31/19 1053 09/01/19 0252 09/02/19 0324  WBC 12.9* 12.3* 10.7* 12.3* 11.6*  NEUTROABS 9.6* 8.6* 7.6 9.9* 7.3  HGB 16.0 15.5 15.8 15.4 15.8  HCT 46.7 45.5 45.3 46.0 46.1  MCV 90.2 90.6 89.0 90.6 90.0  PLT 73* 76* 88* 101* 123XX123*   Basic Metabolic Panel: Recent Labs  Lab 08/29/19 0548 08/30/19 0347 08/31/19 1053 09/01/19 0252  09/02/19 0324  NA 134* 132* 133* 135 137  K 4.0 4.3 3.6 4.3 3.7  CL 100 100 97* 97* 100  CO2 24 23 24 27 26   GLUCOSE 158* 141* 195* 181* 153*  BUN 30* 24* 17 17 23   CREATININE 0.97 0.87 0.79 0.99 1.09  CALCIUM 9.2 9.0 9.0 9.2 9.2  MG 2.0 1.8 1.8 1.9 2.1   GFR: Estimated Creatinine Clearance: 86 mL/min (by C-G formula based on SCr of 1.09 mg/dL). Liver Function Tests: Recent Labs  Lab 08/27/19 0908 08/28/19 0415 08/29/19 0548  AST 20 22 24   ALT 48* 40 40  ALKPHOS 103 112 92  BILITOT 1.7* 1.8* 1.1  PROT 7.5 6.1* 5.4*  ALBUMIN 3.5 2.9* 2.4*   No results for input(s): LIPASE, AMYLASE in the last 168 hours. No results for input(s): AMMONIA in the last 168 hours. Coagulation Profile: Recent Labs  Lab 08/27/19 1531 08/31/19 1328  INR 1.1 1.2   Cardiac Enzymes: Recent Labs  Lab 08/27/19 1705  CKTOTAL 30*   BNP (last 3 results) No results for input(s): PROBNP in the last 8760 hours. HbA1C: No results for input(s): HGBA1C in the last 72 hours. CBG: Recent Labs  Lab 09/01/19 0748 09/01/19 1440 09/01/19 1719 09/01/19 2136 09/02/19 0805  GLUCAP 136* 132* 137* 196* 151*   Lipid Profile: No results for input(s): CHOL, HDL, LDLCALC, TRIG, CHOLHDL, LDLDIRECT in the last 72 hours. Thyroid Function Tests: No results for input(s): TSH, T4TOTAL, FREET4, T3FREE, THYROIDAB in the last 72 hours. Anemia Panel: No results for input(s): VITAMINB12, FOLATE, FERRITIN, TIBC,  IRON, RETICCTPCT in the last 72 hours. Sepsis Labs: No results for input(s): PROCALCITON, LATICACIDVEN in the last 168 hours.  Recent Results (from the past 240 hour(s))  Culture, blood (routine x 2)     Status: Abnormal   Collection Time: 08/27/19  8:54 AM   Specimen: BLOOD  Result Value Ref Range Status   Specimen Description BLOOD RIGHT ANTECUBITAL  Final   Special Requests   Final    BOTTLES DRAWN AEROBIC ONLY Blood Culture adequate volume   Culture  Setup Time   Final    GRAM POSITIVE COCCI IN  CLUSTERS AEROBIC BOTTLE ONLY CRITICAL RESULT CALLED TO, READ BACK BY AND VERIFIED WITH: [name] [name],PHARMD 0217 08/28/2019 T. TYSOR    Culture (A)  Final    STAPHYLOCOCCUS AUREUS SUSCEPTIBILITIES PERFORMED ON PREVIOUS CULTURE WITHIN THE LAST 5 DAYS. Performed at [facility], [place] [address] [place], [place] [address]    Report Status 08/29/2019 FINAL  Final  SARS CORONAVIRUS 2 (TAT 6-12 HRS) Nasal Swab Aptima Multi Swab     Status: None   Collection Time: 08/27/19  9:09 AM   Specimen: Aptima Multi Swab; Nasal Swab  Result Value Ref Range Status   SARS Coronavirus 2 NEGATIVE NEGATIVE Final    Comment: (NOTE) SARS-CoV-2 target nucleic acids are NOT DETECTED. The SARS-CoV-2 RNA is generally detectable in upper and lower respiratory specimens during the acute phase of infection. Negative results do not preclude SARS-CoV-2 infection, do not rule out co-infections with other pathogens, and should not be used as the sole basis for treatment or other patient management decisions. Negative results must be combined with clinical observations, patient history, and epidemiological information. The expected result is Negative. Fact Sheet for Patients: SugarRoll.be Fact Sheet for Healthcare Providers: https://www.woods-mathews.com/ This test is not yet approved or cleared by the [place] FDA and  has been authorized for detection and/or diagnosis of SARS-CoV-2 by FDA under an Emergency Use Authorization (EUA). This EUA will remain  in effect (meaning this test can be used) for the duration of the COVID-19 declaration under Section 56 4(b)(1) of the Act, 21 U.S.C. section 360bbb-3(b)(1), unless the authorization is terminated or revoked sooner. Performed at [facility], [place] [address], [place], [place] [address]   Culture, blood (routine x 2)     Status: Abnormal   Collection Time: 08/27/19  9:51 AM   Specimen: BLOOD RIGHT ARM   Result Value Ref Range Status   Specimen Description BLOOD RIGHT ARM  Final   Special Requests   Final    BOTTLES DRAWN AEROBIC AND ANAEROBIC Blood Culture adequate volume   Culture  Setup Time   Final    GRAM POSITIVE COCCI IN CLUSTERS IN BOTH AEROBIC AND ANAEROBIC BOTTLES CRITICAL RESULT CALLED TO, READ BACK BY AND VERIFIED WITH: [name] [name],PHARMD 0217 08/28/2019 Mena Goes Performed at [facility], [place] [address], [place], [place] [address]    Culture STAPHYLOCOCCUS AUREUS (A)  Final   Report Status 08/29/2019 FINAL  Final   Organism ID, Bacteria STAPHYLOCOCCUS AUREUS  Final      Susceptibility   Staphylococcus aureus - MIC*    CIPROFLOXACIN <=0.5 SENSITIVE Sensitive     ERYTHROMYCIN <=0.25 SENSITIVE Sensitive     GENTAMICIN <=0.5 SENSITIVE Sensitive     OXACILLIN <=0.25 SENSITIVE Sensitive     TETRACYCLINE <=1 SENSITIVE Sensitive     VANCOMYCIN <=0.5 SENSITIVE Sensitive     TRIMETH/SULFA <=10 SENSITIVE Sensitive     CLINDAMYCIN <=  0.25 SENSITIVE Sensitive     RIFAMPIN <=0.5 SENSITIVE Sensitive     Inducible Clindamycin NEGATIVE Sensitive     * STAPHYLOCOCCUS AUREUS  Blood Culture ID Panel (Reflexed)     Status: Abnormal   Collection Time: 08/27/19  9:51 AM  Result Value Ref Range Status   Enterococcus species NOT DETECTED NOT DETECTED Final   Listeria monocytogenes NOT DETECTED NOT DETECTED Final   Staphylococcus species DETECTED (A) NOT DETECTED Final    Comment: CRITICAL RESULT CALLED TO, READ BACK BY AND VERIFIED WITH: [name] [name],PHARMD 0217 08/28/2019 T. TYSOR    Staphylococcus aureus (BCID) DETECTED (A) NOT DETECTED Final    Comment: Methicillin (oxacillin) susceptible Staphylococcus aureus (MSSA). Preferred therapy is anti staphylococcal beta lactam antibiotic (Cefazolin or Nafcillin), unless clinically contraindicated. CRITICAL RESULT CALLED TO, READ BACK BY AND VERIFIED WITH: [name] [name],PHARMD 0217 08/28/2019 T. TYSOR    Methicillin resistance NOT DETECTED NOT  DETECTED Final   Streptococcus species NOT DETECTED NOT DETECTED Final   Streptococcus agalactiae NOT DETECTED NOT DETECTED Final   Streptococcus pneumoniae NOT DETECTED NOT DETECTED Final   Streptococcus pyogenes NOT DETECTED NOT DETECTED Final   Acinetobacter baumannii NOT DETECTED NOT DETECTED Final   Enterobacteriaceae species NOT DETECTED NOT DETECTED Final   Enterobacter cloacae complex NOT DETECTED NOT DETECTED Final   Escherichia coli NOT DETECTED NOT DETECTED Final   Klebsiella oxytoca NOT DETECTED NOT DETECTED Final   Klebsiella pneumoniae NOT DETECTED NOT DETECTED Final   Proteus species NOT DETECTED NOT DETECTED Final   Serratia marcescens NOT DETECTED NOT DETECTED Final   Haemophilus influenzae NOT DETECTED NOT DETECTED Final   Neisseria meningitidis NOT DETECTED NOT DETECTED Final   Pseudomonas aeruginosa NOT DETECTED NOT DETECTED Final   Candida albicans NOT DETECTED NOT DETECTED Final   Candida glabrata NOT DETECTED NOT DETECTED Final   Candida krusei NOT DETECTED NOT DETECTED Final   Candida parapsilosis NOT DETECTED NOT DETECTED Final   Candida tropicalis NOT DETECTED NOT DETECTED Final    Comment: Performed at [facility], [place]. [address], [place], [place] [address]  MRSA PCR Screening     Status: None   Collection Time: 08/28/19  7:54 AM   Specimen: Nasal Mucosa; Nasopharyngeal  Result Value Ref Range Status   MRSA by PCR NEGATIVE NEGATIVE Final    Comment:        The GeneXpert MRSA Assay (FDA approved for NASAL specimens only), is one component of a comprehensive MRSA colonization surveillance program. It is not intended to diagnose MRSA infection nor to guide or monitor treatment for MRSA infections. Performed at [facility], [place] [address] [place], [place] [address]   Culture, blood (routine x 2)     Status: None (Preliminary result)   Collection Time: 08/29/19  5:48 AM   Specimen: BLOOD  Result Value Ref Range Status   Specimen  Description BLOOD RIGHT HAND  Final   Special Requests   Final    BOTTLES DRAWN AEROBIC ONLY Blood Culture adequate volume   Culture   Final    NO GROWTH 4 DAYS Performed at [facility], [place] [address] [place], [place] [address]    Report Status PENDING  Incomplete  Culture, blood (routine x 2)     Status: None (Preliminary result)   Collection Time: 08/29/19  5:51 AM   Specimen: BLOOD  Result Value Ref Range Status   Specimen Description BLOOD LEFT ARM  Final   Special Requests  Final    BOTTLES DRAWN AEROBIC AND ANAEROBIC Blood Culture adequate volume   Culture   Final    NO GROWTH 4 DAYS Performed at [facility], [place] [address], [place], [place] [address]    Report Status PENDING  Incomplete  Surgical PCR screen     Status: None   Collection Time: 08/31/19  1:47 PM   Specimen: Nasal Mucosa; Nasal Swab  Result Value Ref Range Status   MRSA, PCR NEGATIVE NEGATIVE Final   Staphylococcus aureus NEGATIVE NEGATIVE Final    Comment: (NOTE) The Xpert SA Assay (FDA approved for NASAL specimens in patients 108 years of age and older), is one component of a comprehensive surveillance program. It is not intended to diagnose infection nor to guide or monitor treatment. Performed at [facility], [place] [address] [place], [place] [address]   Aerobic/Anaerobic Culture (surgical/deep wound)     Status: None (Preliminary result)   Collection Time: 08/31/19  3:24 PM   Specimen: Other Source; Body Fluid  Result Value Ref Range Status   Specimen Description JOINT FLUID  Final   Special Requests LEFT STERNOCLAVICULAR  Final   Gram Stain   Final    ABUNDANT WBC PRESENT,BOTH PMN AND MONONUCLEAR FEW GRAM POSITIVE COCCI Performed at [facility], [place] [address], [place], [place] [address]    Culture   Final    FEW STAPHYLOCOCCUS AUREUS NO ANAEROBES ISOLATED; CULTURE IN PROGRESS FOR 5 DAYS    Report Status PENDING  Incomplete   Organism ID, Bacteria STAPHYLOCOCCUS  AUREUS  Final      Susceptibility   Staphylococcus aureus - MIC*    CIPROFLOXACIN <=0.5 SENSITIVE Sensitive     ERYTHROMYCIN <=0.25 SENSITIVE Sensitive     GENTAMICIN <=0.5 SENSITIVE Sensitive     OXACILLIN <=0.25 SENSITIVE Sensitive     TETRACYCLINE <=1 SENSITIVE Sensitive     VANCOMYCIN 1 SENSITIVE Sensitive     TRIMETH/SULFA <=10 SENSITIVE Sensitive     CLINDAMYCIN <=0.25 SENSITIVE Sensitive     RIFAMPIN <=0.5 SENSITIVE Sensitive     Inducible Clindamycin NEGATIVE Sensitive     * FEW STAPHYLOCOCCUS AUREUS  Acid Fast Smear (AFB)     Status: None   Collection Time: 08/31/19  3:24 PM   Specimen: Other Source; Body Fluid  Result Value Ref Range Status   AFB Specimen Processing Concentration  Final   Acid Fast Smear Negative  Final    Comment: (NOTE) Performed At: [facility] [place], [place] [id number] Rush Farmer MD [id number]    Source (AFB) JOINT FLUID  Final    Comment: STERNOCLAVICULAR Performed at [facility], [place] [address], [place], [place] [address]          Radiology Studies: Mr Shoulder Left W Wo Contrast  Result Date: 09/01/2019 CLINICAL DATA:  Left shoulder pain, bacteremia EXAM: MRI OF THE LEFT SHOULDER WITHOUT AND WITH CONTRAST TECHNIQUE: Multiplanar, multisequence MR imaging of the left shoulder was performed before and after the administration of intravenous contrast. CONTRAST:  8 mL Gadavist IV contrast COMPARISON:  None. FINDINGS: Rotator cuff: Mild bursal surface irregularity of the distal supraspinatus tendon. There is tendinosis supraspinatus, infraspinatus, and subscapularis tendons. No full-thickness tear. Teres minor intact. Muscles: No atrophy or abnormal signal of the muscles of the rotator cuff. Biceps long head: Biceps tendinosis with probable interstitial tear of the intra-articular portion (series 5, images 11-12). Acromioclavicular Joint: Mild-moderate arthropathy of the acromioclavicular joint. No  subacromial/subdeltoid  bursal fluid. Glenohumeral Joint: No glenohumeral joint effusion. No pericapsular edema or enhancement. Diffuse cartilage thinning and surface irregularity. Labrum: Labral degeneration with probable tear of the anterior-inferior labrum (series 2, images 16-17). Bones: No fracture or dislocation. Reactive degenerative marrow changes centered at the [facility] joint. No osseous destruction. Other: None. IMPRESSION: 1. No MRI evidence of septic arthritis of the left shoulder, as clinically questioned. 2. Rotator cuff tendinosis with low-grade bursal surface irregularity of the distal supraspinatus tendon. No full-thickness rotator cuff tear. 3. Biceps tendinosis with probable interstitial tear of the intra-articular portion. 4. 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 Electronically Signed   By: Davina Poke M.D.   On: 09/01/2019 14:28   Dg Chest Port 1 View  Result Date: 09/01/2019 CLINICAL DATA:  Follow-up sternoclavicular joint debridement EXAM: PORTABLE CHEST 1 VIEW COMPARISON:  08/31/2019 FINDINGS: Cardiomegaly. Unchanged elevation of the left hemidiaphragm with associated atelectasis or scarring. The visualized skeletal structures are unremarkable. IMPRESSION: Cardiomegaly. Unchanged elevation of the left hemidiaphragm with associated atelectasis or scarring. No acute appearing airspace opacity. Electronically Signed   By: Eddie Candle M.D.   On: 09/01/2019 09:22   Dg Chest Port 1 View  Result Date: 08/31/2019 CLINICAL DATA:  Shortness of breath. S/p LEFT sternoclavicular joint debridement. EXAM: PORTABLE CHEST 1 VIEW COMPARISON:  08/19/2019 and prior radiographs FINDINGS: The cardiomediastinal silhouette is unremarkable. LEFT basilar atelectasis noted. There is no evidence of focal airspace disease, pulmonary edema, suspicious pulmonary nodule/mass, pleural effusion, or pneumothorax. No acute bony abnormalities are identified. IMPRESSION: LEFT  basilar atelectasis. Electronically Signed   By: Margarette Canada M.D.   On: 08/31/2019 19:08   [place] Ekg Site Rite  Result Date: 09/02/2019 If Site Rite image not attached, placement could not be confirmed due to current cardiac rhythm.       Scheduled Meds: . atorvastatin  80 mg Oral QHS  . brimonidine  1 drop Both Eyes TID  . dabigatran  150 mg Oral Q12H  . dofetilide  500 mcg Oral BID  . fluticasone furoate-vilanterol  1 puff Inhalation Daily  . insulin aspart  0-15 Units Subcutaneous TID WC  . insulin aspart  0-5 Units Subcutaneous QHS  . ipratropium-albuterol  3 mL Nebulization TID  . lidocaine (PF)  5 mL Intradermal Once  . loratadine  10 mg Oral Daily  . mupirocin ointment  1 application Nasal BID  . pantoprazole  40 mg Oral Daily  . polyethylene glycol  17 g Oral BID  . senna-docusate  1 tablet Oral BID   Continuous Infusions: . [START ON 09/16/2019]  ceFAZolin (ANCEF) IV    . nafcillin IV 2 g (09/02/19 0844)     LOS: 6 days        Alma Friendly, MD [facility] 09/02/2019, 12:26 PM

## 2019-09-02 NOTE — Progress Notes (Signed)
Peripherally Inserted Central Catheter/Midline Placement  The IV Nurse has discussed with the patient and/or persons authorized to consent for the patient, the purpose of this procedure and the potential benefits and risks involved with this procedure.  The benefits include less needle sticks, lab draws from the catheter, and the patient may be discharged home with the catheter. Risks include, but not limited to, infection, bleeding, blood clot (thrombus formation), and puncture of an artery; nerve damage and irregular heartbeat and possibility to perform a PICC exchange if needed/ordered by physician.  Alternatives to this procedure were also discussed.  Bard Power PICC patient education guide, fact sheet on infection prevention and patient information card has been provided to patient /or left at bedside.    PICC/Midline Placement Documentation  PICC Single Lumen [id number] PICC Right Basilic 46 cm 0 cm (Active)  Indication for Insertion or Continuance of Line Home intravenous therapies (PICC only) 09/02/19 1227  Exposed Catheter (cm) 0 cm 09/02/19 1227  Site Assessment Clean;Dry;Intact 09/02/19 1227  Line Status Flushed;Saline locked;Blood return noted 09/02/19 1227  Dressing Type Transparent 09/02/19 1227  Dressing Status Clean;Dry;Intact;Antimicrobial disc in place 09/02/19 1227  Dressing Change Due 09/09/19 09/02/19 1227       Gordan Payment 09/02/2019, 12:28 PM

## 2019-09-02 NOTE — Progress Notes (Signed)
NIF -40 VC 2.9. On first try. Good effort.  Patient on home CPAP

## 2019-09-02 NOTE — Progress Notes (Signed)
PHARMACY CONSULT NOTE FOR:  OUTPATIENT  PARENTERAL ANTIBIOTIC THERAPY (OPAT)  Indication: MSSA bacteremia/discitits Regimen: Nafcillin 2 gm every 4 hours through 9/16 then Cefazolin 2 gm every 8 hours  End date: 09/15/19 for nafcillin and 10/12/19 for cefazolin   IV antibiotic discharge orders are pended. To discharging provider:  please sign these orders via discharge navigator,  Select New Orders & click on the button choice - Manage This Unsigned Work.     Thank you for allowing pharmacy to be a part of this patient's care.  Jimmy Footman, PharmD, BCPS, [place] [facility] Clinical Pharmacist Phone: [phone] 09/02/2019, 10:35 AM

## 2019-09-02 NOTE — Progress Notes (Signed)
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 There has also been a concern for possible contiguous meningitis (unable to rule out given high risk LP in the setting of known vertebral disk infection). TTE unrevealing for vegetations with preserved EF and normal valve function.   Plan for Nafcillin x 14 days through 9/16/20210 then change to cefazolin on 9/17 through 10/12/19.     PLAN: 1. PICC line orders placed today 2. OPAT orders as outlined below  Please call back with any questions about treatment or should there be a change in condition.    OPAT ORDERS:  Diagnosis: Lumbar discitis, Left sternoclavicular joint osteomyelitis  Culture Result: MSSA  Allergies  Allergen Reactions  . Novocain [Procaine] Hives    Dentist appointment in 1968; since then has tolerated lidocaine and provocaine with no hives or difficulty.  . Pseudoephedrine Other (See Comments)    Patient went into afib  . Sulfonamide Derivatives     Childhood reaction   . Cardizem [Diltiazem] Rash    Also 2019  . Pseudoephedrine Hcl Palpitations    Discharge antibiotics: Nafcillin IV through 09/15/2019  THEN   switch to Cefazolin IV 2gm q8h 9/17 - 10/12/2019  Duration: 6 weeks   End Date: 10/12/19  [facility] Care and Maintenance Per Protocol __ Please pull PIC at completion of IV antibiotics _x_ Please leave PIC in place until doctor has seen patient or been notified  Labs weekly while on IV antibiotics: _x_ CBC with differential __ BMP _x_ BMP TWICE WEEKLY** while on Nafcillin then Qweek on cefazolin _x_ CMP _x_ CRP _x_ ESR __ Vancomycin trough  Fax weekly labs to [phone]  Clinic Follow Up Appt: October 13th @ 2:30 pm with [name], NP     Principal Problem:   MSSA bacteremia Active Problems:   Discitis of lumbar region   Septic arthritis of sternoclavicular joint, left ([place])   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 atorvastatin  80 mg Oral QHS  . brimonidine  1 drop Both Eyes TID  . dabigatran  150 mg Oral Q12H  . dofetilide  500 mcg Oral BID  . fluticasone furoate-vilanterol  1 puff Inhalation Daily  . insulin aspart  0-15 Units Subcutaneous TID WC  . insulin aspart  0-5 Units Subcutaneous QHS  . ipratropium-albuterol  3 mL Nebulization TID  . lidocaine (PF)  5 mL Intradermal Once  . loratadine  10 mg Oral Daily  . mupirocin ointment  1 application Nasal BID  . pantoprazole  40 mg Oral Daily  . polyethylene glycol  17 g Oral BID  . senna-docusate  1 tablet Oral BID    SUBJECTIVE: [name] [name] to feel better. His left shoulder has improved and back to baseline (always has had a 'catch' and some soreness he attributed to overuse).  Afebrile. WBC 8/31 12.3K  CRP 9/1 - 6.2  BCx + 8/28  Repeated on 8/29 no growth   Review of Systems: Review of Systems  Constitutional: Positive for weight loss. Negative for chills and fever.  Respiratory: Negative for cough.   Cardiovascular: Negative for leg swelling. Chest pain: improved.  Gastrointestinal: Negative for abdominal pain and vomiting.  Genitourinary: Negative for dysuria.  Musculoskeletal: Positive for back pain. Negative for joint pain.  Neurological: Positive for weakness. Negative for headaches.    Allergies  Allergen Reactions  . Novocain [Procaine] Hives    Dentist appointment in 1968; since then has tolerated lidocaine and provocaine with no hives or difficulty.   . Pseudoephedrine Other (See Comments)    Patient went into afib  . Sulfonamide Derivatives     Childhood reaction   . Cardizem [Diltiazem] Rash    Also 2019  . Pseudoephedrine Hcl Palpitations    OBJECTIVE: Vitals:   09/01/19 2302 09/02/19 0005 09/02/19 0405 09/02/19 0454  BP:  116/67 124/72   Pulse: 81 77 80   Resp: 16 16 18    Temp:  98 F (36.7 C)  98 F (36.7 C)  TempSrc:  Axillary    SpO2: 91% 92% 92%   Weight:      Height:       Body mass index is 34.29 kg/m.  Physical Exam Constitutional:      Comments: Resting in bed quietly.   HENT:     Mouth/Throat:     Mouth: Mucous membranes are moist.  Eyes:     General: No scleral icterus. Cardiovascular:     Rate and Rhythm: Normal rate and regular rhythm.     Heart sounds: No murmur.     Comments: Wound vac engaged to left chest near clavicle. No surrounding erythema. Swelling improved.  Pulmonary:     Effort: Pulmonary effort is normal.     Breath sounds: Normal breath sounds.  Abdominal:     General: Bowel sounds are normal. There is no distension.  Musculoskeletal:       Arms:     Comments: + lower back pain   Skin:    General: Skin is warm and dry.     Capillary Refill: Capillary refill takes less than 2 seconds.  Neurological:     Mental Status: He is alert and oriented to person, place, and time.     Lab Results Lab Results  Component Value Date   WBC 11.6 (H) 09/02/2019   HGB 15.8 09/02/2019   HCT 46.1 09/02/2019   MCV 90.0 09/02/2019   PLT 122 (L) 09/02/2019    Lab Results  Component Value Date   CREATININE 1.09 09/02/2019   BUN 23 09/02/2019   NA 137 09/02/2019   K 3.7 09/02/2019   CL 100 09/02/2019   CO2 26 09/02/2019    Lab Results  Component Value Date   ALT 40 08/29/2019   AST 24 08/29/2019   ALKPHOS 92 08/29/2019   BILITOT 1.1 08/29/2019     Microbiology: Recent Results (from the past 240 hour(s))  Culture, blood (routine x 2)     Status: Abnormal   Collection Time:  08/27/19  8:54 AM   Specimen: BLOOD  Result Value Ref Range Status   Specimen Description BLOOD RIGHT ANTECUBITAL  Final   Special Requests   Final    BOTTLES DRAWN AEROBIC ONLY Blood Culture adequate volume   Culture  Setup Time   Final    GRAM POSITIVE COCCI IN CLUSTERS AEROBIC BOTTLE ONLY CRITICAL RESULT CALLED TO, READ BACK BY AND VERIFIED  WITH: G. ABBOTT,PHARMD 0217 08/28/2019 T. TYSOR    Culture (A)  Final    STAPHYLOCOCCUS AUREUS SUSCEPTIBILITIES PERFORMED ON PREVIOUS CULTURE WITHIN THE LAST 5 DAYS. Performed at [facility], [place] [address], [place], [place] [address]    Report Status 08/29/2019 FINAL  Final  SARS CORONAVIRUS 2 (TAT 6-12 HRS) Nasal Swab Aptima Multi Swab     Status: None   Collection Time: 08/27/19  9:09 AM   Specimen: Aptima Multi Swab; Nasal Swab  Result Value Ref Range Status   SARS Coronavirus 2 NEGATIVE NEGATIVE Final    Comment: (NOTE) SARS-CoV-2 target nucleic acids are NOT DETECTED. The SARS-CoV-2 RNA is generally detectable in upper and lower respiratory specimens during the acute phase of infection. Negative results do not preclude SARS-CoV-2 infection, do not rule out co-infections with other pathogens, and should not be used as the sole basis for treatment or other patient management decisions. Negative results must be combined with clinical observations, patient history, and epidemiological information. The expected result is Negative. Fact Sheet for Patients: SugarRoll.be Fact Sheet for Healthcare Providers: https://www.woods-mathews.com/ This test is not yet approved or cleared by the [place] FDA and  has been authorized for detection and/or diagnosis of SARS-CoV-2 by FDA under an Emergency Use Authorization (EUA). This EUA will remain  in effect (meaning this test can be used) for the duration of the COVID-19 declaration under Section 56 4(b)(1) of the Act, 21 U.S.C. section  360bbb-3(b)(1), unless the authorization is terminated or revoked sooner. Performed at [facility], [place] [address], [place], [place] [address]   Culture, blood (routine x 2)     Status: Abnormal   Collection Time: 08/27/19  9:51 AM   Specimen: BLOOD RIGHT ARM  Result Value Ref Range Status   Specimen Description BLOOD RIGHT ARM  Final   Special Requests   Final    BOTTLES DRAWN AEROBIC AND ANAEROBIC Blood Culture adequate volume   Culture  Setup Time   Final    GRAM POSITIVE COCCI IN CLUSTERS IN BOTH AEROBIC AND ANAEROBIC BOTTLES CRITICAL RESULT CALLED TO, READ BACK BY AND VERIFIED WITH: G. ABBOTT,PHARMD 0217 08/28/2019 Mena Goes Performed at [facility], [place] [address], [place], [place] [address]    Culture STAPHYLOCOCCUS AUREUS (A)  Final   Report Status 08/29/2019 FINAL  Final   Organism ID, Bacteria STAPHYLOCOCCUS AUREUS  Final      Susceptibility   Staphylococcus aureus - MIC*    CIPROFLOXACIN <=0.5 SENSITIVE Sensitive     ERYTHROMYCIN <=0.25 SENSITIVE Sensitive     GENTAMICIN <=0.5 SENSITIVE Sensitive     OXACILLIN <=0.25 SENSITIVE Sensitive     TETRACYCLINE <=1 SENSITIVE Sensitive     VANCOMYCIN <=0.5 SENSITIVE Sensitive     TRIMETH/SULFA <=10 SENSITIVE Sensitive     CLINDAMYCIN <=0.25 SENSITIVE Sensitive     RIFAMPIN <=0.5 SENSITIVE Sensitive     Inducible Clindamycin NEGATIVE Sensitive     * STAPHYLOCOCCUS AUREUS  Blood Culture ID Panel (Reflexed)     Status: Abnormal   Collection Time: 08/27/19  9:51 AM  Result Value Ref Range Status   Enterococcus species NOT DETECTED NOT DETECTED Final   Listeria monocytogenes NOT DETECTED NOT DETECTED Final   Staphylococcus species DETECTED (A) NOT DETECTED Final    Comment: CRITICAL RESULT CALLED TO, READ BACK BY AND VERIFIED WITH: G. ABBOTT,PHARMD 0217 08/28/2019 T. TYSOR    Staphylococcus aureus (BCID) DETECTED (A) NOT DETECTED Final    Comment: Methicillin (  oxacillin) susceptible Staphylococcus aureus (MSSA).  Preferred therapy is anti staphylococcal beta lactam antibiotic (Cefazolin or Nafcillin), unless clinically contraindicated. CRITICAL RESULT CALLED TO, READ BACK BY AND VERIFIED WITH: G. ABBOTT,PHARMD 0217 08/28/2019 T. TYSOR    Methicillin resistance NOT DETECTED NOT DETECTED Final   Streptococcus species NOT DETECTED NOT DETECTED Final   Streptococcus agalactiae NOT DETECTED NOT DETECTED Final   Streptococcus pneumoniae NOT DETECTED NOT DETECTED Final   Streptococcus pyogenes NOT DETECTED NOT DETECTED Final   Acinetobacter baumannii NOT DETECTED NOT DETECTED Final   Enterobacteriaceae species NOT DETECTED NOT DETECTED Final   Enterobacter cloacae complex NOT DETECTED NOT DETECTED Final   Escherichia coli NOT DETECTED NOT DETECTED Final   Klebsiella oxytoca NOT DETECTED NOT DETECTED Final   Klebsiella pneumoniae NOT DETECTED NOT DETECTED Final   Proteus species NOT DETECTED NOT DETECTED Final   Serratia marcescens NOT DETECTED NOT DETECTED Final   Haemophilus influenzae NOT DETECTED NOT DETECTED Final   Neisseria meningitidis NOT DETECTED NOT DETECTED Final   Pseudomonas aeruginosa NOT DETECTED NOT DETECTED Final   Candida albicans NOT DETECTED NOT DETECTED Final   Candida glabrata NOT DETECTED NOT DETECTED Final   Candida krusei NOT DETECTED NOT DETECTED Final   Candida parapsilosis NOT DETECTED NOT DETECTED Final   Candida tropicalis NOT DETECTED NOT DETECTED Final    Comment: Performed at [facility], [place]. [address], [place], [place] [address]  MRSA PCR Screening     Status: None   Collection Time: 08/28/19  7:54 AM   Specimen: Nasal Mucosa; Nasopharyngeal  Result Value Ref Range Status   MRSA by PCR NEGATIVE NEGATIVE Final    Comment:        The GeneXpert MRSA Assay (FDA approved for NASAL specimens only), is one component of a comprehensive MRSA colonization surveillance program. It is not intended to diagnose MRSA infection nor to guide or monitor treatment  for MRSA infections. Performed at [facility], [place] [address] [place], [place] [address]   Culture, blood (routine x 2)     Status: None (Preliminary result)   Collection Time: 08/29/19  5:48 AM   Specimen: BLOOD  Result Value Ref Range Status   Specimen Description BLOOD RIGHT HAND  Final   Special Requests   Final    BOTTLES DRAWN AEROBIC ONLY Blood Culture adequate volume   Culture   Final    NO GROWTH 3 DAYS Performed at [facility], [place] [address], [place], [place] [address]    Report Status PENDING  Incomplete  Culture, blood (routine x 2)     Status: None (Preliminary result)   Collection Time: 08/29/19  5:51 AM   Specimen: BLOOD  Result Value Ref Range Status   Specimen Description BLOOD LEFT ARM  Final   Special Requests   Final    BOTTLES DRAWN AEROBIC AND ANAEROBIC Blood Culture adequate volume   Culture   Final    NO GROWTH 3 DAYS Performed at [facility], [place] [address], [place], [place] [address]    Report Status PENDING  Incomplete  Surgical PCR screen     Status: None   Collection Time: 08/31/19  1:47 PM   Specimen: Nasal Mucosa; Nasal Swab  Result Value Ref Range Status   MRSA, PCR NEGATIVE NEGATIVE Final   Staphylococcus aureus NEGATIVE NEGATIVE Final    Comment: (NOTE) The Xpert SA Assay (FDA approved for NASAL specimens in patients 42 years of age and older), is  one component of a comprehensive surveillance program. It is not intended to diagnose infection nor to guide or monitor treatment. Performed at [facility], [place] [address], [place], [place] [address]   Aerobic/Anaerobic Culture (surgical/deep wound)     Status: None (Preliminary result)   Collection Time: 08/31/19  3:24 PM   Specimen: Other Source; Body Fluid  Result Value Ref Range Status   Specimen Description JOINT FLUID  Final   Special Requests LEFT STERNOCLAVICULAR  Final   Gram Stain   Final    ABUNDANT WBC PRESENT,BOTH PMN AND MONONUCLEAR FEW GRAM  POSITIVE COCCI    Culture FEW STAPHYLOCOCCUS AUREUS  Final   Report Status PENDING  Incomplete   Organism ID, Bacteria STAPHYLOCOCCUS AUREUS  Final      Susceptibility   Staphylococcus aureus - MIC*    CIPROFLOXACIN <=0.5 SENSITIVE Sensitive     ERYTHROMYCIN <=0.25 SENSITIVE Sensitive     GENTAMICIN <=0.5 SENSITIVE Sensitive     OXACILLIN <=0.25 SENSITIVE Sensitive     TETRACYCLINE <=1 SENSITIVE Sensitive     VANCOMYCIN 1 SENSITIVE Sensitive     TRIMETH/SULFA <=10 SENSITIVE Sensitive     CLINDAMYCIN <=0.25 SENSITIVE Sensitive     RIFAMPIN <=0.5 SENSITIVE Sensitive     Inducible Clindamycin Value in next row Sensitive      NEGATIVEPerformed at [facility] [address] [place], [place] [address]    * FEW STAPHYLOCOCCUS AUREUS  Acid Fast Smear (AFB)     Status: None   Collection Time: 08/31/19  3:24 PM   Specimen: Other Source; Body Fluid  Result Value Ref Range Status   AFB Specimen Processing Concentration  Final   Acid Fast Smear Negative  Final    Comment: (NOTE) Performed At: [facility] [place], [place] [id number] Rush Farmer MD OB:[id number]    Source (AFB) JOINT FLUID  Final    Comment: STERNOCLAVICULAR Performed at [facility], [place] [address], [place], [place] [address]     [name], MSN, NP-C [facility] [email or web address] Pager: [phone] Office: [phone] [place]: [phone]

## 2019-09-02 NOTE — Progress Notes (Signed)
Inpatient Rehabilitation Admissions Coordinator  Inpatient rehab consult received. I met with patient briefly at bedside. He is not yet at a level to tolerate more intensive therapy required of an inpt rehab admit and to obtain insurance approval. I will follow his progress.  Danne Baxter, RN, MSN Rehab Admissions Coordinator [phone] 09/02/2019 1:58 PM

## 2019-09-02 NOTE — Progress Notes (Signed)
Patient in deep sleep and oxygen saturations occasionally dropping down 78 %.Patient has oxygen bleed in and this nurse increased oxygen to 5 liters and saturations gradually increased to 93-94%.This nurse paged respiratory therapist Cyril Mourning to come assess patient to see if needed a different face mask.Will continue to monitor patient.

## 2019-09-02 NOTE — Progress Notes (Addendum)
      [address]       [place] [address]             [phone]       2 Days Post-Op Procedure(s) (LRB): LEFT STERNOCLAVICULAR WOUND DEBRIDEMENT WITH APPLICATION OF WOUND VAC (Left) Subjective: Says he feel better today. He demonstrated improved range of motion in he upper extremities.   Objective: Vital signs in last 24 hours: Temp:  [97.7 F (36.5 C)-98.1 F (36.7 C)] 98 F (36.7 C) (09/03 0454) Pulse Rate:  [77-82] 80 (09/03 0405) Cardiac Rhythm: Heart block (09/03 0707) Resp:  [16-19] 18 (09/03 0405) BP: (116-124)/(65-72) 124/72 (09/03 0405) SpO2:  [91 %-97 %] 92 % (09/03 0405)   Intake/Output from previous day: 09/02 0701 - 09/03 0700 In: 240 [P.O.:240] Out: 650 [Urine:650] Intake/Output this shift: No intake/output data recorded.  General appearance: alert, cooperative and mild distress Neurologic: intact Wound: The wound vac is properly positioned in the left chest wound and functioning appropriately. There is minimal serosanguinous drainage in the collection chamber.   Lab Results: Recent Labs    09/01/19 0252 09/02/19 0324  WBC 12.3* 11.6*  HGB 15.4 15.8  HCT 46.0 46.1  PLT 101* 122*   BMET:  Recent Labs    09/01/19 0252 09/02/19 0324  NA 135 137  K 4.3 3.7  CL 97* 100  CO2 27 26  GLUCOSE 181* 153*  BUN 17 23  CREATININE 0.99 1.09  CALCIUM 9.2 9.2    PT/INR:  Recent Labs    08/31/19 1328  LABPROT 14.7  INR 1.2   ABG    Component Value Date/Time   TCO2 26 03/01/2017 0258   CBG (last 3)  Recent Labs    09/01/19 1719 09/01/19 2136 09/02/19 0805  GLUCAP 137* 196* 151*    Assessment/Plan: S/P Procedure(s) (LRB): LEFT STERNOCLAVICULAR WOUND DEBRIDEMENT WITH APPLICATION OF WOUND VAC (Left)  -POD2 surgical debridement of infected left sternoclavicular joint and application of a wound vac. Operative cultures grew staph aureus. Will continue vac therapy and plan dressing change at the bedside tomorrow.   Currently on IV nafcillin and ID plans to contitnue this for 2-3 weeks then transition to IV cefazolin to complete a 6-week course.  Agree with ID recommendation for a PICC line. Will defer to the primary team.    LOS: 6 days    [name], PA-C 09/02/2019  I have seen and examined [name] [name]. and agree with the above assessment  and plan.  Grace Isaac MD [place] [phone] Office [phone] 09/02/2019 3:18 PM

## 2019-09-02 NOTE — Discharge Instructions (Signed)

## 2019-09-02 NOTE — Progress Notes (Addendum)
NIF -40, VC 2.6. Patient had initially refused and then stated he would try. Good effort

## 2019-09-03 LAB — BASIC METABOLIC PANEL
Anion gap: 9 (ref 5–15)
BUN: 22 mg/dL (ref 8–23)
CO2: 28 mmol/L (ref 22–32)
Calcium: 9.1 mg/dL (ref 8.9–10.3)
Chloride: 101 mmol/L (ref 98–111)
Creatinine, Ser: 1.01 mg/dL (ref 0.61–1.24)
GFR calc Af Amer: >60 mL/min (ref >60)
GFR calc non Af Amer: >60 mL/min (ref >60)
Glucose, Bld: 165 mg/dL — ABNORMAL HIGH (ref 70–99)
Potassium: 3.6 mmol/L (ref 3.5–5.1)
Sodium: 138 mmol/L (ref 135–145)

## 2019-09-03 LAB — CBC WITH DIFFERENTIAL/PLATELET
Abs Immature Granulocytes: 0.1 10*3/uL — ABNORMAL HIGH (ref 0.00–0.07)
Basophils Absolute: 0 10*3/uL (ref 0.0–0.1)
Basophils Relative: 0 %
Eosinophils Absolute: 0.4 10*3/uL (ref 0.0–0.5)
Eosinophils Relative: 4 %
HCT: 46.2 % (ref 39.0–52.0)
Hemoglobin: 15.1 g/dL (ref 13.0–17.0)
Immature Granulocytes: 1 %
Lymphocytes Relative: 16 %
Lymphs Abs: 1.6 10*3/uL (ref 0.7–4.0)
MCH: 30.5 pg (ref 26.0–34.0)
MCHC: 32.7 g/dL (ref 30.0–36.0)
MCV: 93.3 fL (ref 80.0–100.0)
Monocytes Absolute: 1 10*3/uL (ref 0.1–1.0)
Monocytes Relative: 10 %
Neutro Abs: 6.8 10*3/uL (ref 1.7–7.7)
Neutrophils Relative %: 69 %
Platelets: 147 10*3/uL — ABNORMAL LOW (ref 150–400)
RBC: 4.95 MIL/uL (ref 4.22–5.81)
RDW: 13.9 % (ref 11.5–15.5)
WBC: 10 10*3/uL (ref 4.0–10.5)
nRBC: 0 % (ref 0.0–0.2)

## 2019-09-03 LAB — GLUCOSE, CAPILLARY
Glucose-Capillary: 142 mg/dL — ABNORMAL HIGH (ref 70–99)
Glucose-Capillary: 191 mg/dL — ABNORMAL HIGH (ref 70–99)
Glucose-Capillary: 136 mg/dL — ABNORMAL HIGH (ref 70–99)
Glucose-Capillary: 124 mg/dL — ABNORMAL HIGH (ref 70–99)

## 2019-09-03 LAB — MAGNESIUM: Magnesium: 1.9 mg/dL (ref 1.7–2.4)

## 2019-09-03 LAB — CULTURE, BLOOD (ROUTINE X 2)
Culture: NO GROWTH
Culture: NO GROWTH
Special Requests: ADEQUATE
Special Requests: ADEQUATE

## 2019-09-03 MED ORDER — MORPHINE SULFATE (PF) 2 MG/ML IV SOLN
2.0000 mg | INTRAVENOUS | Status: DC | PRN
  Administered 2019-09-03 – 2019-09-12 (×2): 2 mg via INTRAVENOUS
  Filled 2019-09-03 (×4): qty 1

## 2019-09-03 MED ORDER — IPRATROPIUM-ALBUTEROL 0.5-2.5 (3) MG/3ML IN SOLN
3.0000 mL | Freq: Two times a day (BID) | RESPIRATORY_TRACT | Status: DC
  Administered 2019-09-03 – 2019-09-07 (×9): 3 mL via RESPIRATORY_TRACT
  Filled 2019-09-03 (×9): qty 3

## 2019-09-03 MED ORDER — POTASSIUM CHLORIDE CRYS ER 20 MEQ PO TBCR
20.0000 meq | EXTENDED_RELEASE_TABLET | Freq: Once | ORAL | Status: AC
  Administered 2019-09-03: 20 meq via ORAL
  Filled 2019-09-03: qty 1

## 2019-09-03 NOTE — Progress Notes (Signed)
NIF -40 VC 2.8 Second try.   Pt was confused on the first try, on second try pt had good effort.

## 2019-09-03 NOTE — Consult Note (Signed)
[place] Nurse wound consult note Patient receiving care in [facility] [phone].  Dr. Servando Snare in attendance at the time of the [facility] dressing change. Patient was pre-medicated and tolerated the procedure well. Reason for Consult: VAC dressing change to left C-Road joint Wound type: infectious, surgical One piece of black foam removed, one piece placed. Wound measures 2 cm x 5.2 cm x 3.2 cm. Wound bed is red granulation tissue with some bone exposed. Drainage (amount, consistency, odor) sanginous Periwound: intact Dressing procedure/placement/frequency: MWF VAC change by [place] nurse  Val Riles, RN, MSN, CWOCN, CNS-BC, pager [phone]

## 2019-09-03 NOTE — Progress Notes (Signed)
Patient ID: [name] Dekker., male   DOB: 1951-08-29, 68 y.o.   MRN: [id number]  PROGRESS NOTE    [name] Chimes.  [id number] DOB: 1951/03/20 DOA: 08/27/2019 PCP: Burnis Medin, MD   Brief Narrative:  68 year old male with history of OSA on CPAP, perforated appendicitis in 2015, CAD status post stents, hyperlipidemia, diabetes mellitus type 2, optic neuritis with recent hospitalization at [facility] treated with intravenous and subsequent oral steroids, recent right lower extremity cellulitis treated as an outpatient with oral doxycycline, A. fib on Pradaxa presented with back pain and progressively worsening difficulty walking.  Neurology was consulted.  He was found to have MSSA bacteremia with question of L4-L5 discitis/osteomyelitis.  He was started on Ancef.  ID was consulted.  Assessment & Plan: MSSA bacteremia Currently afebrile, with no leukocytosis Source likely L4-L5 discitis/osteomyelitis, septic arthritis/possible osteomyelitis of the sternoclavicular joint space Repeat blood culture from 08/29/2019 has been negative till date CRP 6.1 Echo showed no valvular vegetations Neurosurgery/Dr. Annette Stable was consulted by phone on 08/28/2019 and he reviewed the MRI lumbar spine and recommended no surgical intervention at this time ID on board, recommend changing to nafcillin due to possible CNS involvement, will need 2 weeks, with de-escalation back to cefazolin for a total duration of IV antibiotics for 6 weeks given concern for associated discitis Continue IV nafcillin  Infected left sternoclavicular joint s/p I&D/debridement with application of wound VAC by CTS Surgical/deep tissue culture growing stap aureus  Cardiothoracic surgeons on board, appreciate recs Continue IV nafcillin  Left shoulder pain Improving MRI showed no evidence of septic arthritis, rotator cuff tendinosis, biceps tendinosis with probable interstitial tear of the intra-articular portion, 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  Neurology is not convinced about this for now. CTA of the head and neck showed no evidence of CNS vasculitis Neurology consulted, appreciate further recs  Thrombocytopenia Unclear etiology, no signs of bleeding/bruising Daily CBC  Paroxysmal A. Fib Rate controlled Continue Tikosyn, check magnesium daily Continue Pradaxa  Diabetes mellitus type 2 A1c 6.7 on 07/21/2019 SSI, Accu-Cheks, hypoglycemic protocol  Hyperlipidemia Continue Lipitor  OSA Continue CPAP at bedside  Obesity Lifestyle modification advised  Generalized deconditioning/weakness PT/OT on board    DVT prophylaxis: Pradaxa Code Status: Full Family Communication: None at bedside Disposition Plan: [facility]   Consultants Neurology Orthopedics ID IR Spoke to neurosurgery/Dr. Annette Stable on 08/28/2019 CTS  Procedures:  Echo IMPRESSIONS    1. The left ventricle has normal systolic function with an ejection fraction of 60-65%. The cavity size was normal. There is mildly increased left ventricular wall thickness. Left ventricular diastolic Doppler parameters are consistent with impaired  relaxation. Indeterminate filling pressures The E/e' is 8-15. No evidence of left ventricular regional wall motion abnormalities.  2. The right ventricle has normal systolic function. The cavity was normal. There is no increase in right ventricular wall thickness.  3. Left atrial size was mildly dilated.  4. The mitral valve is abnormal. Mild thickening of the mitral  valve leaflet. No mitral valve vegetation visualized.  5. The tricuspid valve is grossly normal.  6. No vegetation on the aortic valve.  7. The aortic valve  is tricuspid. Mild sclerosis of the aortic valve. Aortic valve regurgitation is trivial by color flow Doppler. No stenosis of the aortic valve.  8. The aorta is normal unless otherwise noted.  SUMMARY   LVEF 60-65%, mild LVH, normal wall motion, grade 1 DD, indeterminate LV filling pressure, normal RV function, mild LAE, aortic valve sclerosis with trivial AI, no valvular vegetations  Antimicrobials: Nafcillin   Subjective: Today, reported back pain, denies any other complaints   Objective: Vitals:   09/03/19 0851 09/03/19 0935 09/03/19 1106 09/03/19 1306  BP: 124/73  137/74   Pulse: 73  79 64  Resp: 16  16 14   Temp: 98 F (36.7 C) 97.8 F (36.6 C)  97.9 F (36.6 C)  TempSrc: Oral Oral  Oral  SpO2: 99%  94% 96%  Weight:      Height:        Intake/Output Summary (Last 24 hours) at 09/03/2019 1532 Last data filed at 09/03/2019 1400 Gross per 24 hour  Intake 1490 ml  Output 3125 ml  Net -1635 ml   Filed Weights   08/29/19 0500 08/30/19 0416  Weight: 118 kg 117.9 kg    Examination:  General: NAD   Cardiovascular: S1, S2 present  Respiratory: Mild bibasilar crackles, chest wound vac noted  Abdomen: Soft, nontender, nondistended, bowel sounds present  Musculoskeletal: No bilateral pedal edema noted  Skin: Normal  Psychiatry: Normal mood   Data Reviewed: I have personally reviewed following labs and imaging studies  CBC: Recent Labs  Lab 08/30/19 0347 08/31/19 1053 09/01/19 0252 09/02/19 0324 09/03/19 0408  WBC 12.3* 10.7* 12.3* 11.6* 10.0  NEUTROABS 8.6* 7.6 9.9* 7.3 6.8  HGB 15.5 15.8 15.4 15.8 15.1  HCT 45.5 45.3 46.0 46.1 46.2  MCV 90.6 89.0 90.6 90.0 93.3  PLT 76* 88* 101* 122* [id number]*   Basic Metabolic Panel: Recent Labs  Lab 08/30/19 0347 08/31/19 1053 09/01/19 0252 09/02/19 0324  09/03/19 0408  NA 132* 133* 135 137 138  K 4.3 3.6 4.3 3.7 3.6  CL 100 97* 97* 100 101  CO2 23 24 27 26 28   GLUCOSE 141* 195* 181* 153* 165*  BUN 24* 17 17 23 22   CREATININE 0.87 0.79 0.99 1.09 1.01  CALCIUM 9.0 9.0 9.2 9.2 9.1  MG 1.8 1.8 1.9 2.1 1.9   GFR: Estimated Creatinine Clearance: 92.9 mL/min (by C-G formula based on SCr of 1.01 mg/dL). Liver Function Tests: Recent Labs  Lab 08/28/19 0415 08/29/19 0548  AST 22 24  ALT 40 40  ALKPHOS 112 92  BILITOT 1.8* 1.1  PROT 6.1* 5.4*  ALBUMIN 2.9* 2.4*   No results for input(s): LIPASE, AMYLASE in the last 168 hours. No results for input(s): AMMONIA in the last 168 hours. Coagulation Profile: Recent Labs  Lab 08/31/19 1328  INR 1.2   Cardiac Enzymes: Recent Labs  Lab 08/27/19 1705  CKTOTAL 30*   BNP (last 3 results) No results for input(s): PROBNP in the last 8760 hours. HbA1C: No results for input(s): HGBA1C in the last 72 hours. CBG: Recent Labs  Lab 09/02/19 1243 09/02/19 1703 09/02/19 2140 09/03/19 0832 09/03/19 1307  GLUCAP 183* 136* 157* 142* 191*   Lipid Profile: No results for input(s): CHOL, HDL, LDLCALC, TRIG, CHOLHDL, LDLDIRECT in the last 72 hours. Thyroid Function Tests: No results for input(s): TSH, T4TOTAL, FREET4, T3FREE, THYROIDAB in the last 72 hours. Anemia Panel: No results for input(s): VITAMINB12, FOLATE, FERRITIN, TIBC, IRON, RETICCTPCT in the last 72 hours. Sepsis Labs: No  results for input(s): PROCALCITON, LATICACIDVEN in the last 168 hours.  Recent Results (from the past 240 hour(s))  Culture, blood (routine x 2)     Status: Abnormal   Collection Time: 08/27/19  8:54 AM   Specimen: BLOOD  Result Value Ref Range Status   Specimen Description BLOOD RIGHT ANTECUBITAL  Final   Special Requests   Final    BOTTLES DRAWN AEROBIC ONLY Blood Culture adequate volume   Culture  Setup Time   Final    GRAM POSITIVE COCCI IN CLUSTERS AEROBIC BOTTLE ONLY CRITICAL RESULT CALLED TO,  READ BACK BY AND VERIFIED WITH: G. ABBOTT,PHARMD 0217 08/28/2019 T. TYSOR    Culture (A)  Final    STAPHYLOCOCCUS AUREUS SUSCEPTIBILITIES PERFORMED ON PREVIOUS CULTURE WITHIN THE LAST 5 DAYS. Performed at [facility], [place] [address] [place], [place] [address]    Report Status 08/29/2019 FINAL  Final  SARS CORONAVIRUS 2 (TAT 6-12 HRS) Nasal Swab Aptima Multi Swab     Status: None   Collection Time: 08/27/19  9:09 AM   Specimen: Aptima Multi Swab; Nasal Swab  Result Value Ref Range Status   SARS Coronavirus 2 NEGATIVE NEGATIVE Final    Comment: (NOTE) SARS-CoV-2 target nucleic acids are NOT DETECTED. The SARS-CoV-2 RNA is generally detectable in upper and lower respiratory specimens during the acute phase of infection. Negative results do not preclude SARS-CoV-2 infection, do not rule out co-infections with other pathogens, and should not be used as the sole basis for treatment or other patient management decisions. Negative results must be combined with clinical observations, patient history, and epidemiological information. The expected result is Negative. Fact Sheet for Patients: SugarRoll.be Fact Sheet for Healthcare Providers: https://www.woods-mathews.com/ This test is not yet approved or cleared by the [place] FDA and  has been authorized for detection and/or diagnosis of SARS-CoV-2 by FDA under an Emergency Use Authorization (EUA). This EUA will remain  in effect (meaning this test can be used) for the duration of the COVID-19 declaration under Section 56 4(b)(1) of the Act, 21 U.S.C. section 360bbb-3(b)(1), unless the authorization is terminated or revoked sooner. Performed at [facility], [place] [address], [place], [place] [address]   Culture, blood (routine x 2)     Status: Abnormal   Collection Time: 08/27/19  9:51 AM   Specimen: BLOOD RIGHT ARM  Result Value Ref Range Status   Specimen Description BLOOD  RIGHT ARM  Final   Special Requests   Final    BOTTLES DRAWN AEROBIC AND ANAEROBIC Blood Culture adequate volume   Culture  Setup Time   Final    GRAM POSITIVE COCCI IN CLUSTERS IN BOTH AEROBIC AND ANAEROBIC BOTTLES CRITICAL RESULT CALLED TO, READ BACK BY AND VERIFIED WITH: G. ABBOTT,PHARMD 0217 08/28/2019 Mena Goes Performed at [facility], [place] [address], [place], [place] [address]    Culture STAPHYLOCOCCUS AUREUS (A)  Final   Report Status 08/29/2019 FINAL  Final   Organism ID, Bacteria STAPHYLOCOCCUS AUREUS  Final      Susceptibility   Staphylococcus aureus - MIC*    CIPROFLOXACIN <=0.5 SENSITIVE Sensitive     ERYTHROMYCIN <=0.25 SENSITIVE Sensitive     GENTAMICIN <=0.5 SENSITIVE Sensitive     OXACILLIN <=0.25 SENSITIVE Sensitive     TETRACYCLINE <=1 SENSITIVE Sensitive     VANCOMYCIN <=0.5 SENSITIVE Sensitive     TRIMETH/SULFA <=10 SENSITIVE Sensitive     CLINDAMYCIN <=0.25 SENSITIVE Sensitive     RIFAMPIN <=0.5 SENSITIVE  Sensitive     Inducible Clindamycin NEGATIVE Sensitive     * STAPHYLOCOCCUS AUREUS  Blood Culture ID Panel (Reflexed)     Status: Abnormal   Collection Time: 08/27/19  9:51 AM  Result Value Ref Range Status   Enterococcus species NOT DETECTED NOT DETECTED Final   Listeria monocytogenes NOT DETECTED NOT DETECTED Final   Staphylococcus species DETECTED (A) NOT DETECTED Final    Comment: CRITICAL RESULT CALLED TO, READ BACK BY AND VERIFIED WITH: G. ABBOTT,PHARMD 0217 08/28/2019 T. TYSOR    Staphylococcus aureus (BCID) DETECTED (A) NOT DETECTED Final    Comment: Methicillin (oxacillin) susceptible Staphylococcus aureus (MSSA). Preferred therapy is anti staphylococcal beta lactam antibiotic (Cefazolin or Nafcillin), unless clinically contraindicated. CRITICAL RESULT CALLED TO, READ BACK BY AND VERIFIED WITH: G. ABBOTT,PHARMD 0217 08/28/2019 T. TYSOR    Methicillin resistance NOT DETECTED NOT DETECTED Final   Streptococcus species NOT DETECTED NOT  DETECTED Final   Streptococcus agalactiae NOT DETECTED NOT DETECTED Final   Streptococcus pneumoniae NOT DETECTED NOT DETECTED Final   Streptococcus pyogenes NOT DETECTED NOT DETECTED Final   Acinetobacter baumannii NOT DETECTED NOT DETECTED Final   Enterobacteriaceae species NOT DETECTED NOT DETECTED Final   Enterobacter cloacae complex NOT DETECTED NOT DETECTED Final   Escherichia coli NOT DETECTED NOT DETECTED Final   Klebsiella oxytoca NOT DETECTED NOT DETECTED Final   Klebsiella pneumoniae NOT DETECTED NOT DETECTED Final   Proteus species NOT DETECTED NOT DETECTED Final   Serratia marcescens NOT DETECTED NOT DETECTED Final   Haemophilus influenzae NOT DETECTED NOT DETECTED Final   Neisseria meningitidis NOT DETECTED NOT DETECTED Final   Pseudomonas aeruginosa NOT DETECTED NOT DETECTED Final   Candida albicans NOT DETECTED NOT DETECTED Final   Candida glabrata NOT DETECTED NOT DETECTED Final   Candida krusei NOT DETECTED NOT DETECTED Final   Candida parapsilosis NOT DETECTED NOT DETECTED Final   Candida tropicalis NOT DETECTED NOT DETECTED Final    Comment: Performed at [facility], [place]. [address], [place], [place] [address]  MRSA PCR Screening     Status: None   Collection Time: 08/28/19  7:54 AM   Specimen: Nasal Mucosa; Nasopharyngeal  Result Value Ref Range Status   MRSA by PCR NEGATIVE NEGATIVE Final    Comment:        The GeneXpert MRSA Assay (FDA approved for NASAL specimens only), is one component of a comprehensive MRSA colonization surveillance program. It is not intended to diagnose MRSA infection nor to guide or monitor treatment for MRSA infections. Performed at [facility], [place] [address], [place], [place] [address]   Culture, blood (routine x 2)     Status: None   Collection Time: 08/29/19  5:48 AM   Specimen: BLOOD  Result Value Ref Range Status   Specimen Description BLOOD RIGHT HAND  Final   Special Requests   Final    BOTTLES DRAWN  AEROBIC ONLY Blood Culture adequate volume   Culture   Final    NO GROWTH 5 DAYS Performed at [facility], [place] [address], [place], [place] [address]    Report Status 09/03/2019 FINAL  Final  Culture, blood (routine x 2)     Status: None   Collection Time: 08/29/19  5:51 AM   Specimen: BLOOD  Result Value Ref Range Status   Specimen Description BLOOD LEFT ARM  Final   Special Requests   Final    BOTTLES DRAWN AEROBIC AND ANAEROBIC Blood Culture adequate volume  Culture   Final    NO GROWTH 5 DAYS Performed at [facility], [place] [address], [place], [place] [address]    Report Status 09/03/2019 FINAL  Final  Surgical PCR screen     Status: None   Collection Time: 08/31/19  1:47 PM   Specimen: Nasal Mucosa; Nasal Swab  Result Value Ref Range Status   MRSA, PCR NEGATIVE NEGATIVE Final   Staphylococcus aureus NEGATIVE NEGATIVE Final    Comment: (NOTE) The Xpert SA Assay (FDA approved for NASAL specimens in patients 34 years of age and older), is one component of a comprehensive surveillance program. It is not intended to diagnose infection nor to guide or monitor treatment. Performed at [facility], [place] [address], [place], [place] [address]   Fungus Culture With Stain     Status: None (Preliminary result)   Collection Time: 08/31/19  3:24 PM   Specimen: Other Source; Body Fluid  Result Value Ref Range Status   Fungus Stain Final report  Final    Comment: (NOTE) Performed At: [facility] [place], [place] [id number] Rush Farmer MD [id number]    Fungus (Mycology) Culture PENDING  Incomplete   Fungal Source JOINT FLUID  Final    Comment: STERNOCLAVICULAR Performed at [facility], [place] [address] [place], [place] [address]   Aerobic/Anaerobic Culture (surgical/deep wound)     Status: None (Preliminary result)   Collection Time: 08/31/19  3:24 PM   Specimen: Other Source; Body Fluid  Result Value Ref Range Status    Specimen Description JOINT FLUID  Final   Special Requests LEFT STERNOCLAVICULAR  Final   Gram Stain   Final    ABUNDANT WBC PRESENT,BOTH PMN AND MONONUCLEAR FEW GRAM POSITIVE COCCI Performed at [facility], [place] [address] [place], [place] [address]    Culture   Final    FEW STAPHYLOCOCCUS AUREUS NO ANAEROBES ISOLATED; CULTURE IN PROGRESS FOR 5 DAYS    Report Status PENDING  Incomplete   Organism ID, Bacteria STAPHYLOCOCCUS AUREUS  Final      Susceptibility   Staphylococcus aureus - MIC*    CIPROFLOXACIN <=0.5 SENSITIVE Sensitive     ERYTHROMYCIN <=0.25 SENSITIVE Sensitive     GENTAMICIN <=0.5 SENSITIVE Sensitive     OXACILLIN <=0.25 SENSITIVE Sensitive     TETRACYCLINE <=1 SENSITIVE Sensitive     VANCOMYCIN 1 SENSITIVE Sensitive     TRIMETH/SULFA <=10 SENSITIVE Sensitive     CLINDAMYCIN <=0.25 SENSITIVE Sensitive     RIFAMPIN <=0.5 SENSITIVE Sensitive     Inducible Clindamycin NEGATIVE Sensitive     * FEW STAPHYLOCOCCUS AUREUS  Acid Fast Smear (AFB)     Status: None   Collection Time: 08/31/19  3:24 PM   Specimen: Other Source; Body Fluid  Result Value Ref Range Status   AFB Specimen Processing Concentration  Final   Acid Fast Smear Negative  Final    Comment: (NOTE) Performed At: [facility] [place], [place] [id number] Rush Farmer MD [id number]    Source (AFB) JOINT FLUID  Final    Comment: STERNOCLAVICULAR Performed at [facility], [place] [address] [place], [place] [address]   Fungus Culture Result     Status: None   Collection Time: 08/31/19  3:24 PM  Result Value Ref Range Status   Result 1 Comment  Final    Comment: (NOTE) KOH/Calcofluor preparation:  no fungus observed. Performed At: [facility] [address]  [address] [place], [place] [id number] Rush Farmer MD [id number]          Radiology Studies: [place] Ekg Site Rite  Result Date: 09/02/2019 If Site Rite image not attached, placement could not be  confirmed due to current cardiac rhythm.       Scheduled Meds: . atorvastatin  80 mg Oral QHS  . brimonidine  1 drop Both Eyes TID  . dabigatran  150 mg Oral Q12H  . dofetilide  500 mcg Oral BID  . fluticasone furoate-vilanterol  1 puff Inhalation Daily  . insulin aspart  0-15 Units Subcutaneous TID WC  . insulin aspart  0-5 Units Subcutaneous QHS  . ipratropium-albuterol  3 mL Nebulization BID  . loratadine  10 mg Oral Daily  . mupirocin ointment  1 application Nasal BID  . pantoprazole  40 mg Oral Daily  . polyethylene glycol  17 g Oral BID  . potassium chloride  20 mEq Oral Daily  . senna-docusate  1 tablet Oral BID  . sodium chloride flush  10-40 mL Intracatheter Q12H   Continuous Infusions: . [START ON 09/16/2019]  ceFAZolin (ANCEF) IV    . nafcillin IV 2 g (09/03/19 1404)     LOS: 7 days        [name] Friendly, MD [facility] 09/03/2019, 3:32 PM

## 2019-09-03 NOTE — Progress Notes (Signed)
Patient completed NIF and VC with good effort.  NIF -26 VC 2.9L  Will continue to monitor patient.

## 2019-09-03 NOTE — Progress Notes (Addendum)
      [address]       [place] [address]             [phone]       2 Days Post-Op Procedure(s) (LRB): LEFT STERNOCLAVICULAR WOUND DEBRIDEMENT WITH APPLICATION OF WOUND VAC (Left) Subjective:  feels  better today. He demonstrated improved range of motion in he upper extremities.   Objective: Vital signs in last 24 hours: Temp:  [97.8 F (36.6 C)-98.8 F (37.1 C)] 97.8 F (36.6 C) (09/04 0935) Pulse Rate:  [73-81] 73 (09/04 0851) Cardiac Rhythm: Heart block (09/04 0847) Resp:  [14-19] 16 (09/04 0851) BP: (123-130)/(72-75) 124/73 (09/04 0851) SpO2:  [90 %-99 %] 99 % (09/04 0851)   Intake/Output from previous day: 09/03 0701 - 09/04 0700 In: 1130 [IV Piggyback:1130] Out: 2025 [Urine:2025] Intake/Output this shift: No intake/output data recorded.  General appearance: alert, cooperative and mild distress Neurologic: intact Wound: The wound vac is properly positioned in the left chest wound and functioning appropriately. There is minimal serosanguinous drainage.  Wound vac changed today   Lab Results: Recent Labs    09/02/19 0324 09/03/19 0408  WBC 11.6* 10.0  HGB 15.8 15.1  HCT 46.1 46.2  PLT 122* 147*   BMET:  Recent Labs    09/02/19 0324 09/03/19 0408  NA 137 138  K 3.7 3.6  CL 100 101  CO2 26 28  GLUCOSE 153* 165*  BUN 23 22  CREATININE 1.09 1.01  CALCIUM 9.2 9.1    PT/INR:  Recent Labs    08/31/19 1328  LABPROT 14.7  INR 1.2   ABG    Component Value Date/Time   TCO2 26 03/01/2017 0258   CBG (last 3)  Recent Labs    09/02/19 1703 09/02/19 2140 09/03/19 0832  GLUCAP 136* 157* 142*    Assessment/Plan: S/P Procedure(s) (LRB): LEFT STERNOCLAVICULAR WOUND DEBRIDEMENT WITH APPLICATION OF WOUND VAC (Left)  -POD3surgical debridement of infected left sternoclavicular joint and application of a wound vac. Operative cultures grew staph aureus. Wound vac changed today starting to granulate no frank pus evident.continue  mwf wound vac changes    Currently on IV nafcillin and ID plans to contitnue this for 2-3 weeks then transition to IV cefazolin to complete a 6-week course.  Agree with ID recommendation for a PICC line. Will defer to the primary team.   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

## 2019-09-03 NOTE — Progress Notes (Signed)
Physical Therapy Treatment Patient Details Name: [name] [name]. MRN: [id number] DOB: 1951-12-21 Today's Date: 09/03/2019    History of Present Illness [name] [name]. is a 68 y.o. male with medical history significant of OSA on CPAP; perforated appy (2015); obesity; CAD s/p stents; HLD; DM; optic nerve neuritis; and afib on Pradaxa presenting with back pain.    PT Comments    Pt able to tolerate more arm and leg movement while supine but still not abel to tolerate sitting position.  Pt cooperative but still 8-10/10 pain throughout PT session - this is following pain pill.  Son present.  Pt only able to tolerate HOB up 30 degrees - eating with this positioning.  Pt reports it has been 2 weeks since he has been up on his feet.  He would be good CIR candidate if he could tolerate upright.  Would abdominal binder help with comfort when trying to get up into sitting?   Follow Up Recommendations  CIR     Equipment Recommendations       Recommendations for Other Services       Precautions / Restrictions Precautions Precautions: Fall Precaution Comments: wound vac L [place] joint Restrictions Weight Bearing Restrictions: No Other Position/Activity Restrictions: wound vac intact    Mobility  Bed Mobility Overal bed mobility: Needs Assistance Bed Mobility: Sit to Sidelying;Supine to Sit Rolling: Mod assist;+2 for physical assistance Sidelying to sit: Mod assist;+2 for physical assistance Supine to sit: Max assist;+2 for physical assistance;Total assist Sit to supine: Max assist;+2 for physical assistance   General bed mobility comments: Pt started out getting into hooklying - to stretch hips/lower back.  Pt did some gentle - lower trunk rotation - one inch - unabel to go to right due to pain.  I put pillow between pts legs in hooklying and tried to get him into sitting - he was unable to tolerate the pain and had to return to supine and we helped him get repositioned and back to  comfort.  Pt only abel to tolerate HOB about 30 degrees - this is as high as he can allow for eating.  Transfers                 General transfer comment: deferred due to pt's pain level in sitting and pt declined attempt  Ambulation/Gait             General Gait Details: unable   Stairs             Wheelchair Mobility    Modified Rankin (Stroke Patients Only)       Balance       Sitting balance - Comments: unable to get into sitting today - did more activities with legs while still supine                                    Cognition Arousal/Alertness: Awake/alert Behavior During Therapy: WFL for tasks assessed/performed Overall Cognitive Status: Within Functional Limits for tasks assessed                                 General Comments: Pt agreeable to trying to move around more.  he says he can move his arms more than he could.  Pt said he had BM today - second one in a month  Exercises Other Exercises Other Exercises: pt did heel slides x 5 reps each leg - leaving the other leg grounded for support to back Other Exercises: pt did lower trunk rotation - one inch - x 5 Other Exercises: Hooklying - pt did core tightening - keeping one leg still and moving other leg out and in x 10 each leg    General Comments        Pertinent Vitals/Pain Pain Assessment: 0-10 Pain Score: 9  Pain Location: lower back Pain Descriptors / Indicators: Discomfort;Grimacing;Guarding Pain Intervention(s): Limited activity within patient's tolerance;Monitored during session;Premedicated before session;Repositioned    Home Living                      Prior Function            PT Goals (current goals can now be found in the care plan section) Progress towards PT goals: Progressing toward goals    Frequency    Min 3X/week      PT Plan Current plan remains appropriate    Co-evaluation              AM-PAC  PT "6 Clicks" Mobility   Outcome Measure  Help needed turning from your back to your side while in a flat bed without using bedrails?: A Lot Help needed moving from lying on your back to sitting on the side of a flat bed without using bedrails?: Total Help needed moving to and from a bed to a chair (including a wheelchair)?: Total Help needed standing up from a chair using your arms (e.g., wheelchair or bedside chair)?: Total Help needed to walk in hospital room?: Total Help needed climbing 3-5 steps with a railing? : Total 6 Click Score: 7    End of Session   Activity Tolerance: Patient limited by pain Patient left: in bed;with bed alarm set;with family/visitor present;with call bell/phone within reach Nurse Communication: Mobility status PT Visit Diagnosis: Muscle weakness (generalized) (M62.81);Other abnormalities of gait and mobility (R26.89);Pain;Difficulty in walking, not elsewhere classified (R26.2) Pain - part of body: Shoulder     Time: 1415-1445 PT Time Calculation (min) (ACUTE ONLY): 30 min  Charges:  $Therapeutic Exercise: 8-22 mins $Therapeutic Activity: 8-22 mins                     09/03/2019   Rande Lawman, PT    Loyal Buba 09/03/2019, 2:59 PM

## 2019-09-04 LAB — CBC WITH DIFFERENTIAL/PLATELET
Abs Immature Granulocytes: 0.11 10*3/uL — ABNORMAL HIGH (ref 0.00–0.07)
Basophils Absolute: 0 10*3/uL (ref 0.0–0.1)
Basophils Relative: 0 %
Eosinophils Absolute: 0.3 10*3/uL (ref 0.0–0.5)
Eosinophils Relative: 3 %
HCT: 43.4 % (ref 39.0–52.0)
Hemoglobin: 14.5 g/dL (ref 13.0–17.0)
Immature Granulocytes: 1 %
Lymphocytes Relative: 18 %
Lymphs Abs: 1.9 10*3/uL (ref 0.7–4.0)
MCH: 30.7 pg (ref 26.0–34.0)
MCHC: 33.4 g/dL (ref 30.0–36.0)
MCV: 91.8 fL (ref 80.0–100.0)
Monocytes Absolute: 0.8 10*3/uL (ref 0.1–1.0)
Monocytes Relative: 8 %
Neutro Abs: 7.3 10*3/uL (ref 1.7–7.7)
Neutrophils Relative %: 70 %
Platelets: 153 10*3/uL (ref 150–400)
RBC: 4.73 MIL/uL (ref 4.22–5.81)
RDW: 13.9 % (ref 11.5–15.5)
WBC: 10.4 10*3/uL (ref 4.0–10.5)
nRBC: 0 % (ref 0.0–0.2)

## 2019-09-04 LAB — BASIC METABOLIC PANEL
Anion gap: 9 (ref 5–15)
BUN: 17 mg/dL (ref 8–23)
CO2: 27 mmol/L (ref 22–32)
Calcium: 9 mg/dL (ref 8.9–10.3)
Chloride: 102 mmol/L (ref 98–111)
Creatinine, Ser: 0.89 mg/dL (ref 0.61–1.24)
GFR calc Af Amer: >60 mL/min (ref >60)
GFR calc non Af Amer: >60 mL/min (ref >60)
Glucose, Bld: 157 mg/dL — ABNORMAL HIGH (ref 70–99)
Potassium: 3.4 mmol/L — ABNORMAL LOW (ref 3.5–5.1)
Sodium: 138 mmol/L (ref 135–145)

## 2019-09-04 LAB — GLUTAMIC ACID DECARBOXYLASE AUTO ABS: Glutamic Acid Decarb Ab: <5 U/mL (ref 0.0–5.0)

## 2019-09-04 LAB — GLUCOSE, CAPILLARY
Glucose-Capillary: 157 mg/dL — ABNORMAL HIGH (ref 70–99)
Glucose-Capillary: 156 mg/dL — ABNORMAL HIGH (ref 70–99)
Glucose-Capillary: 142 mg/dL — ABNORMAL HIGH (ref 70–99)
Glucose-Capillary: 212 mg/dL — ABNORMAL HIGH (ref 70–99)

## 2019-09-04 LAB — MAGNESIUM: Magnesium: 1.8 mg/dL (ref 1.7–2.4)

## 2019-09-04 MED ORDER — POTASSIUM CHLORIDE CRYS ER 20 MEQ PO TBCR
40.0000 meq | EXTENDED_RELEASE_TABLET | Freq: Once | ORAL | Status: AC
  Administered 2019-09-04: 40 meq via ORAL
  Filled 2019-09-04: qty 2

## 2019-09-04 MED ORDER — OXYCODONE-ACETAMINOPHEN 5-325 MG PO TABS
1.0000 | ORAL_TABLET | ORAL | Status: DC | PRN
  Administered 2019-09-04: 1 via ORAL
  Administered 2019-09-04: 2 via ORAL
  Administered 2019-09-05: 1 via ORAL
  Administered 2019-09-05: 2 via ORAL
  Administered 2019-09-05: 1 via ORAL
  Administered 2019-09-06: 2 via ORAL
  Administered 2019-09-06: 1 via ORAL
  Administered 2019-09-06 – 2019-09-12 (×15): 2 via ORAL
  Administered 2019-09-12: 1 via ORAL
  Administered 2019-09-13 – 2019-09-15 (×5): 2 via ORAL
  Filled 2019-09-04: qty 1
  Filled 2019-09-04 (×10): qty 2
  Filled 2019-09-04: qty 1
  Filled 2019-09-04 (×11): qty 2
  Filled 2019-09-04: qty 1
  Filled 2019-09-04 (×5): qty 2

## 2019-09-04 MED ORDER — CYCLOBENZAPRINE HCL 5 MG PO TABS
5.0000 mg | ORAL_TABLET | Freq: Three times a day (TID) | ORAL | Status: DC | PRN
  Administered 2019-09-04 – 2019-09-08 (×7): 5 mg via ORAL
  Filled 2019-09-04 (×7): qty 1

## 2019-09-04 NOTE — Progress Notes (Signed)
Patient completed the NIF and VC with good effort.  NIF -60 VC 2.85L

## 2019-09-04 NOTE — Progress Notes (Addendum)
      [address]       [place] [address]             [phone]      4 Days Post-Op Procedure(s) (LRB): LEFT STERNOCLAVICULAR WOUND DEBRIDEMENT WITH APPLICATION OF WOUND VAC (Left)   Subjective:  No new complaints.  Having increased pain.  He is not getting much relief with pain medication.  He is upset with PT as he made some progress today, but was in a lot of discomfort and the PT raised the head of his bead after they told them not to.  Objective: Vital signs in last 24 hours: Temp:  [97.5 F (36.4 C)-98.4 F (36.9 C)] 97.5 F (36.4 C) (09/05 1201) Pulse Rate:  [60-75] 75 (09/05 1201) Cardiac Rhythm: Normal sinus rhythm (09/05 0844) Resp:  [12-24] 16 (09/05 1201) BP: (116-135)/(67-80) 135/67 (09/05 1201) SpO2:  [89 %-97 %] 94 % (09/05 1208) FiO2 (%):  [28 %] 28 % (09/05 0908)  Intake/Output from previous day: 09/04 0701 - 09/05 0700 In: 360 [P.O.:360] Out: 1425 [Urine:1400; Drains:25] Intake/Output this shift: Total I/O In: 240 [P.O.:240] Out: 1200 [Urine:1200]  General appearance: alert, cooperative, visibly uncomfortable Heart: regular rate and rhythm Lungs: clear to auscultation bilaterally Wound: wound vac in place  Lab Results: Recent Labs    09/03/19 0408 09/04/19 0343  WBC 10.0 10.4  HGB 15.1 14.5  HCT 46.2 43.4  PLT 147* 153   BMET:  Recent Labs    09/03/19 0408 09/04/19 0343  NA 138 138  K 3.6 3.4*  CL 101 102  CO2 28 27  GLUCOSE 165* 157*  BUN 22 17  CREATININE 1.01 0.89  CALCIUM 9.1 9.0    PT/INR: No results for input(s): LABPROT, INR in the last 72 hours. ABG    Component Value Date/Time   TCO2 26 03/01/2017 0258   CBG (last 3)  Recent Labs    09/03/19 2204 09/04/19 0816 09/04/19 1259  GLUCAP 124* 157* 156*    Assessment/Plan: S/P Procedure(s) (LRB): LEFT STERNOCLAVICULAR WOUND DEBRIDEMENT WITH APPLICATION OF WOUND VAC (Left)  1. S/P Incision and Drainage of Left [place] Joint abscess-- wound vac  remains in place, wound care to change vac on Monday 2. ID- afebrile, on ABX per ID 3. Pain control- patient only receiving 1 percocet, will increase dose to 1-2 Percocet every 4 hours, restart home Flexeril 4. Dispo- patient needs better pain control so he may actively participate with PT/OT, wound vac change on Monday, care per primary   LOS: 8 days    Ellwood Handler 09/04/2019 patient examined and medical record reviewed,agree with above note. Tharon Aquas Trigt III 09/04/2019

## 2019-09-04 NOTE — Progress Notes (Signed)
Patient ID: [name] [name]., male   DOB: 1951-05-03, 68 y.o.   MRN: [id number]  PROGRESS NOTE    [name].  [id number] DOB: 10/25/1951 DOA: 08/27/2019 PCP: Burnis Medin, MD   Brief Narrative:  68 year old male with history of OSA on CPAP, perforated appendicitis in 2015, CAD status post stents, hyperlipidemia, diabetes mellitus type 2, optic neuritis with recent hospitalization at [facility] treated with intravenous and subsequent oral steroids, recent right lower extremity cellulitis treated as an outpatient with oral doxycycline, A. fib on Pradaxa presented with back pain and progressively worsening difficulty walking.  Neurology was consulted.  He was found to have MSSA bacteremia with question of L4-L5 discitis/osteomyelitis.  He was started on Ancef.  ID was consulted.  Assessment & Plan: MSSA bacteremia Currently afebrile, with no leukocytosis Source likely L4-L5 discitis/osteomyelitis, septic arthritis/possible osteomyelitis of the sternoclavicular joint space Repeat blood culture from 08/29/2019 has been negative till date CRP 6.1 Echo showed no valvular vegetations Neurosurgery/Dr. Annette Stable was consulted by phone on 08/28/2019 and he reviewed the MRI lumbar spine and recommended no surgical intervention at this time ID on board, recommend changing to nafcillin due to possible CNS involvement, will need 2 weeks, with de-escalation back to cefazolin for a total duration of IV antibiotics for 6 weeks given concern for associated discitis Continue IV nafcillin  Infected left sternoclavicular joint s/p I&D/debridement with application of wound VAC by CTS Surgical/deep tissue culture growing stap aureus  Cardiothoracic surgeons on board, appreciate recs Continue IV nafcillin  Left shoulder pain Improving MRI showed no evidence of septic arthritis, rotator cuff tendinosis, biceps tendinosis with probable interstitial tear of the intra-articular portion, 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  Neurology is not convinced about this for now. CTA of the head and neck showed no evidence of CNS vasculitis Neurology consulted, appreciate further recs  Thrombocytopenia Resolved Unclear etiology, no signs of bleeding/bruising Daily CBC  Paroxysmal A. Fib Rate controlled Continue Tikosyn, check magnesium daily Continue Pradaxa  Diabetes mellitus type 2 A1c 6.7 on 07/21/2019 SSI, Accu-Cheks, hypoglycemic protocol  Hyperlipidemia Continue Lipitor  OSA Continue CPAP at bedside  Obesity Lifestyle modification advised  Generalized deconditioning/weakness PT/OT on board    DVT prophylaxis: Pradaxa Code Status: Full Family Communication: None at bedside Disposition Plan: [facility]   Consultants Neurology Orthopedics ID IR Spoke to neurosurgery/Dr. Annette Stable on 08/28/2019 CTS  Procedures:  Echo IMPRESSIONS    1. The left ventricle has normal systolic function with an ejection fraction of 60-65%. The cavity size was normal. There is mildly increased left ventricular wall thickness. Left ventricular diastolic Doppler parameters are consistent with impaired  relaxation. Indeterminate filling pressures The E/e' is 8-15. No evidence of left ventricular regional wall motion abnormalities.  2. The right ventricle has normal systolic function. The cavity was normal. There is no increase in right ventricular wall thickness.  3. Left atrial size was mildly dilated.  4. The mitral valve is abnormal. Mild thickening of the  mitral valve leaflet. No mitral valve vegetation visualized.  5. The tricuspid valve is grossly normal.  6. No vegetation on the aortic valve.  7. The aortic  valve is tricuspid. Mild sclerosis of the aortic valve. Aortic valve regurgitation is trivial by color flow Doppler. No stenosis of the aortic valve.  8. The aorta is normal unless otherwise noted.  SUMMARY   LVEF 60-65%, mild LVH, normal wall motion, grade 1 DD, indeterminate LV filling pressure, normal RV function, mild LAE, aortic valve sclerosis with trivial AI, no valvular vegetations  Antimicrobials: Nafcillin   Subjective: Today, patient reports some shoulder discomfort/pain.  Denies any other new complaint   Objective: Vitals:   09/04/19 0832 09/04/19 0908 09/04/19 1201 09/04/19 1208  BP: 120/80  135/67   Pulse: 75  75   Resp: 17  16   Temp:   (!) 97.5 F (36.4 C)   TempSrc:   Oral   SpO2: 97% 96% (!) 89% 94%  Weight:      Height:        Intake/Output Summary (Last 24 hours) at 09/04/2019 1626 Last data filed at 09/04/2019 1435 Gross per 24 hour  Intake 240 ml  Output 1525 ml  Net -1285 ml   Filed Weights   08/29/19 0500 08/30/19 0416  Weight: 118 kg 117.9 kg    Examination:  General: NAD   Cardiovascular: S1, S2 present  Respiratory:  Diminished breath sounds bilaterally, chest wound VAC noted  Abdomen: Soft, nontender, nondistended, bowel sounds present  Musculoskeletal: No bilateral pedal edema noted  Skin: Normal  Psychiatry: Normal mood   Data Reviewed: I have personally reviewed following labs and imaging studies  CBC: Recent Labs  Lab 08/31/19 1053 09/01/19 0252 09/02/19 0324 09/03/19 0408 09/04/19 0343  WBC 10.7* 12.3* 11.6* 10.0 10.4  NEUTROABS 7.6 9.9* 7.3 6.8 7.3  HGB 15.8 15.4 15.8 15.1 14.5  HCT 45.3 46.0 46.1 46.2 43.4  MCV 89.0 90.6 90.0 93.3 91.8  PLT 88* 101* 122* 147* 0000000   Basic Metabolic Panel: Recent Labs  Lab 08/31/19 1053 09/01/19 0252 09/02/19 0324  09/03/19 0408 09/04/19 0343  NA 133* 135 137 138 138  K 3.6 4.3 3.7 3.6 3.4*  CL 97* 97* 100 101 102  CO2 24 27 26 28 27   GLUCOSE 195* 181* 153* 165* 157*  BUN 17 17 23 22 17   CREATININE 0.79 0.99 1.09 1.01 0.89  CALCIUM 9.0 9.2 9.2 9.1 9.0  MG 1.8 1.9 2.1 1.9 1.8   GFR: Estimated Creatinine Clearance: 105.4 mL/min (by C-G formula based on SCr of 0.89 mg/dL). Liver Function Tests: Recent Labs  Lab 08/29/19 0548  AST 24  ALT 40  ALKPHOS 92  BILITOT 1.1  PROT 5.4*  ALBUMIN 2.4*   No results for input(s): LIPASE, AMYLASE in the last 168 hours. No results for input(s): AMMONIA in the last 168 hours. Coagulation Profile: Recent Labs  Lab 08/31/19 1328  INR 1.2   Cardiac Enzymes: No results for input(s): CKTOTAL, CKMB, CKMBINDEX, TROPONINI in the last 168 hours. BNP (last 3 results) No results for input(s): PROBNP in the last 8760 hours. HbA1C: No results for input(s): HGBA1C in the last 72 hours. CBG: Recent Labs  Lab 09/03/19 1307 09/03/19 1747 09/03/19 2204 09/04/19 0816 09/04/19 1259  GLUCAP 191* 136* 124* 157* 156*   Lipid Profile: No results for input(s): CHOL, HDL, LDLCALC, TRIG, CHOLHDL, LDLDIRECT in the last 72 hours. Thyroid Function Tests: No results for input(s): TSH, T4TOTAL, FREET4, T3FREE, THYROIDAB in the last 72 hours. Anemia Panel: No results for input(s): VITAMINB12, FOLATE, FERRITIN, TIBC, IRON, RETICCTPCT in the last 72 hours. Sepsis Labs: No results for input(s):  PROCALCITON, LATICACIDVEN in the last 168 hours.  Recent Results (from the past 240 hour(s))  Culture, blood (routine x 2)     Status: Abnormal   Collection Time: 08/27/19  8:54 AM   Specimen: BLOOD  Result Value Ref Range Status   Specimen Description BLOOD RIGHT ANTECUBITAL  Final   Special Requests   Final    BOTTLES DRAWN AEROBIC ONLY Blood Culture adequate volume   Culture  Setup Time   Final    GRAM POSITIVE COCCI IN CLUSTERS AEROBIC BOTTLE ONLY CRITICAL RESULT  CALLED TO, READ BACK BY AND VERIFIED WITH: G. ABBOTT,PHARMD 0217 08/28/2019 T. TYSOR    Culture (A)  Final    STAPHYLOCOCCUS AUREUS SUSCEPTIBILITIES PERFORMED ON PREVIOUS CULTURE WITHIN THE LAST 5 DAYS. Performed at [facility], [place] [address], [place], [place] [address]    Report Status 08/29/2019 FINAL  Final  SARS CORONAVIRUS 2 (TAT 6-12 HRS) Nasal Swab Aptima Multi Swab     Status: None   Collection Time: 08/27/19  9:09 AM   Specimen: Aptima Multi Swab; Nasal Swab  Result Value Ref Range Status   SARS Coronavirus 2 NEGATIVE NEGATIVE Final    Comment: (NOTE) SARS-CoV-2 target nucleic acids are NOT DETECTED. The SARS-CoV-2 RNA is generally detectable in upper and lower respiratory specimens during the acute phase of infection. Negative results do not preclude SARS-CoV-2 infection, do not rule out co-infections with other pathogens, and should not be used as the sole basis for treatment or other patient management decisions. Negative results must be combined with clinical observations, patient history, and epidemiological information. The expected result is Negative. Fact Sheet for Patients: SugarRoll.be Fact Sheet for Healthcare Providers: https://www.woods-mathews.com/ This test is not yet approved or cleared by the [place] FDA and  has been authorized for detection and/or diagnosis of SARS-CoV-2 by FDA under an Emergency Use Authorization (EUA). This EUA will remain  in effect (meaning this test can be used) for the duration of the COVID-19 declaration under Section 56 4(b)(1) of the Act, 21 U.S.C. section 360bbb-3(b)(1), unless the authorization is terminated or revoked sooner. Performed at [facility], [place] [address], [place], [place] [address]   Culture, blood (routine x 2)     Status: Abnormal   Collection Time: 08/27/19  9:51 AM   Specimen: BLOOD RIGHT ARM  Result Value Ref Range Status   Specimen  Description BLOOD RIGHT ARM  Final   Special Requests   Final    BOTTLES DRAWN AEROBIC AND ANAEROBIC Blood Culture adequate volume   Culture  Setup Time   Final    GRAM POSITIVE COCCI IN CLUSTERS IN BOTH AEROBIC AND ANAEROBIC BOTTLES CRITICAL RESULT CALLED TO, READ BACK BY AND VERIFIED WITH: G. ABBOTT,PHARMD 0217 08/28/2019 Mena Goes Performed at [facility], [place] [address], [place], [place] [address]    Culture STAPHYLOCOCCUS AUREUS (A)  Final   Report Status 08/29/2019 FINAL  Final   Organism ID, Bacteria STAPHYLOCOCCUS AUREUS  Final      Susceptibility   Staphylococcus aureus - MIC*    CIPROFLOXACIN <=0.5 SENSITIVE Sensitive     ERYTHROMYCIN <=0.25 SENSITIVE Sensitive     GENTAMICIN <=0.5 SENSITIVE Sensitive     OXACILLIN <=0.25 SENSITIVE Sensitive     TETRACYCLINE <=1 SENSITIVE Sensitive     VANCOMYCIN <=0.5 SENSITIVE Sensitive     TRIMETH/SULFA <=10 SENSITIVE Sensitive     CLINDAMYCIN <=0.25 SENSITIVE Sensitive     RIFAMPIN <=0.5 SENSITIVE Sensitive  Inducible Clindamycin NEGATIVE Sensitive     * STAPHYLOCOCCUS AUREUS  Blood Culture ID Panel (Reflexed)     Status: Abnormal   Collection Time: 08/27/19  9:51 AM  Result Value Ref Range Status   Enterococcus species NOT DETECTED NOT DETECTED Final   Listeria monocytogenes NOT DETECTED NOT DETECTED Final   Staphylococcus species DETECTED (A) NOT DETECTED Final    Comment: CRITICAL RESULT CALLED TO, READ BACK BY AND VERIFIED WITH: G. ABBOTT,PHARMD 0217 08/28/2019 T. TYSOR    Staphylococcus aureus (BCID) DETECTED (A) NOT DETECTED Final    Comment: Methicillin (oxacillin) susceptible Staphylococcus aureus (MSSA). Preferred therapy is anti staphylococcal beta lactam antibiotic (Cefazolin or Nafcillin), unless clinically contraindicated. CRITICAL RESULT CALLED TO, READ BACK BY AND VERIFIED WITH: G. ABBOTT,PHARMD 0217 08/28/2019 T. TYSOR    Methicillin resistance NOT DETECTED NOT DETECTED Final   Streptococcus species NOT  DETECTED NOT DETECTED Final   Streptococcus agalactiae NOT DETECTED NOT DETECTED Final   Streptococcus pneumoniae NOT DETECTED NOT DETECTED Final   Streptococcus pyogenes NOT DETECTED NOT DETECTED Final   Acinetobacter baumannii NOT DETECTED NOT DETECTED Final   Enterobacteriaceae species NOT DETECTED NOT DETECTED Final   Enterobacter cloacae complex NOT DETECTED NOT DETECTED Final   Escherichia coli NOT DETECTED NOT DETECTED Final   Klebsiella oxytoca NOT DETECTED NOT DETECTED Final   Klebsiella pneumoniae NOT DETECTED NOT DETECTED Final   Proteus species NOT DETECTED NOT DETECTED Final   Serratia marcescens NOT DETECTED NOT DETECTED Final   Haemophilus influenzae NOT DETECTED NOT DETECTED Final   Neisseria meningitidis NOT DETECTED NOT DETECTED Final   Pseudomonas aeruginosa NOT DETECTED NOT DETECTED Final   Candida albicans NOT DETECTED NOT DETECTED Final   Candida glabrata NOT DETECTED NOT DETECTED Final   Candida krusei NOT DETECTED NOT DETECTED Final   Candida parapsilosis NOT DETECTED NOT DETECTED Final   Candida tropicalis NOT DETECTED NOT DETECTED Final    Comment: Performed at [facility], [place]. [address] [place], [place] [address]  MRSA PCR Screening     Status: None   Collection Time: 08/28/19  7:54 AM   Specimen: Nasal Mucosa; Nasopharyngeal  Result Value Ref Range Status   MRSA by PCR NEGATIVE NEGATIVE Final    Comment:        The GeneXpert MRSA Assay (FDA approved for NASAL specimens only), is one component of a comprehensive MRSA colonization surveillance program. It is not intended to diagnose MRSA infection nor to guide or monitor treatment for MRSA infections. Performed at [facility], [place] [address], [place], [place] [address]   Culture, blood (routine x 2)     Status: None   Collection Time: 08/29/19  5:48 AM   Specimen: BLOOD  Result Value Ref Range Status   Specimen Description BLOOD RIGHT HAND  Final   Special Requests   Final     BOTTLES DRAWN AEROBIC ONLY Blood Culture adequate volume   Culture   Final    NO GROWTH 5 DAYS Performed at [facility], [place] [address] [place], [place] [address]    Report Status 09/03/2019 FINAL  Final  Culture, blood (routine x 2)     Status: None   Collection Time: 08/29/19  5:51 AM   Specimen: BLOOD  Result Value Ref Range Status   Specimen Description BLOOD LEFT ARM  Final   Special Requests   Final    BOTTLES DRAWN AEROBIC AND ANAEROBIC Blood Culture adequate volume   Culture  Final    NO GROWTH 5 DAYS Performed at [facility], [place] [address], [place], [place] [address]    Report Status 09/03/2019 FINAL  Final  Surgical PCR screen     Status: None   Collection Time: 08/31/19  1:47 PM   Specimen: Nasal Mucosa; Nasal Swab  Result Value Ref Range Status   MRSA, PCR NEGATIVE NEGATIVE Final   Staphylococcus aureus NEGATIVE NEGATIVE Final    Comment: (NOTE) The Xpert SA Assay (FDA approved for NASAL specimens in patients 33 years of age and older), is one component of a comprehensive surveillance program. It is not intended to diagnose infection nor to guide or monitor treatment. Performed at [facility], [place] [address], [place], [place] [address]   Fungus Culture With Stain     Status: None (Preliminary result)   Collection Time: 08/31/19  3:24 PM   Specimen: Other Source; Body Fluid  Result Value Ref Range Status   Fungus Stain Final report  Final    Comment: (NOTE) Performed At: [facility] [place], [place] [id number] Rush Farmer MD [id number]    Fungus (Mycology) Culture PENDING  Incomplete   Fungal Source JOINT FLUID  Final    Comment: STERNOCLAVICULAR Performed at [facility], [place] [address] [place], [place] [address]   Aerobic/Anaerobic Culture (surgical/deep wound)     Status: None (Preliminary result)   Collection Time: 08/31/19  3:24 PM   Specimen: Other Source; Body Fluid  Result Value Ref Range  Status   Specimen Description JOINT FLUID  Final   Special Requests LEFT STERNOCLAVICULAR  Final   Gram Stain   Final    ABUNDANT WBC PRESENT,BOTH PMN AND MONONUCLEAR FEW GRAM POSITIVE COCCI Performed at [facility], [place] [address], [place], [place] [address]    Culture   Final    FEW STAPHYLOCOCCUS AUREUS NO ANAEROBES ISOLATED; CULTURE IN PROGRESS FOR 5 DAYS    Report Status PENDING  Incomplete   Organism ID, Bacteria STAPHYLOCOCCUS AUREUS  Final      Susceptibility   Staphylococcus aureus - MIC*    CIPROFLOXACIN <=0.5 SENSITIVE Sensitive     ERYTHROMYCIN <=0.25 SENSITIVE Sensitive     GENTAMICIN <=0.5 SENSITIVE Sensitive     OXACILLIN <=0.25 SENSITIVE Sensitive     TETRACYCLINE <=1 SENSITIVE Sensitive     VANCOMYCIN 1 SENSITIVE Sensitive     TRIMETH/SULFA <=10 SENSITIVE Sensitive     CLINDAMYCIN <=0.25 SENSITIVE Sensitive     RIFAMPIN <=0.5 SENSITIVE Sensitive     Inducible Clindamycin NEGATIVE Sensitive     * FEW STAPHYLOCOCCUS AUREUS  Acid Fast Smear (AFB)     Status: None   Collection Time: 08/31/19  3:24 PM   Specimen: Other Source; Body Fluid  Result Value Ref Range Status   AFB Specimen Processing Concentration  Final   Acid Fast Smear Negative  Final    Comment: (NOTE) Performed At: [facility] [place], [place] [id number] Rush Farmer MD [id number]    Source (AFB) JOINT FLUID  Final    Comment: STERNOCLAVICULAR Performed at [facility], [place] [address] [place], [place] [address]   Fungus Culture Result     Status: None   Collection Time: 08/31/19  3:24 PM  Result Value Ref Range Status   Result 1 Comment  Final    Comment: (NOTE) KOH/Calcofluor preparation:  no fungus observed. Performed At: [facility] [place], [place]  [id number] Rush Farmer MD [id number]          Radiology Studies: No results found.      Scheduled Meds: . atorvastatin  80 mg Oral QHS  . brimonidine  1  drop Both Eyes TID  . dabigatran  150 mg Oral Q12H  . dofetilide  500 mcg Oral BID  . fluticasone furoate-vilanterol  1 puff Inhalation Daily  . insulin aspart  0-15 Units Subcutaneous TID WC  . insulin aspart  0-5 Units Subcutaneous QHS  . ipratropium-albuterol  3 mL Nebulization BID  . loratadine  10 mg Oral Daily  . mupirocin ointment  1 application Nasal BID  . pantoprazole  40 mg Oral Daily  . polyethylene glycol  17 g Oral BID  . potassium chloride  20 mEq Oral Daily  . senna-docusate  1 tablet Oral BID  . sodium chloride flush  10-40 mL Intracatheter Q12H   Continuous Infusions: . [START ON 09/16/2019]  ceFAZolin (ANCEF) IV    . nafcillin IV 2 g (09/04/19 1212)     LOS: 8 days        Alma Friendly, MD [facility] 09/04/2019, 4:26 PM

## 2019-09-04 NOTE — Progress Notes (Signed)
NIF >-40 cmh2o

## 2019-09-05 LAB — GLUCOSE, CAPILLARY
Glucose-Capillary: 155 mg/dL — ABNORMAL HIGH (ref 70–99)
Glucose-Capillary: 149 mg/dL — ABNORMAL HIGH (ref 70–99)
Glucose-Capillary: 116 mg/dL — ABNORMAL HIGH (ref 70–99)
Glucose-Capillary: 153 mg/dL — ABNORMAL HIGH (ref 70–99)

## 2019-09-05 LAB — BASIC METABOLIC PANEL
Anion gap: 5 (ref 5–15)
BUN: 15 mg/dL (ref 8–23)
CO2: 30 mmol/L (ref 22–32)
Calcium: 8.9 mg/dL (ref 8.9–10.3)
Chloride: 103 mmol/L (ref 98–111)
Creatinine, Ser: 0.87 mg/dL (ref 0.61–1.24)
GFR calc Af Amer: >60 mL/min (ref >60)
GFR calc non Af Amer: >60 mL/min (ref >60)
Glucose, Bld: 141 mg/dL — ABNORMAL HIGH (ref 70–99)
Potassium: 3.8 mmol/L (ref 3.5–5.1)
Sodium: 138 mmol/L (ref 135–145)

## 2019-09-05 LAB — AEROBIC/ANAEROBIC CULTURE W GRAM STAIN (SURGICAL/DEEP WOUND)

## 2019-09-05 LAB — MAGNESIUM: Magnesium: 1.8 mg/dL (ref 1.7–2.4)

## 2019-09-05 MED ORDER — TRAZODONE HCL 50 MG PO TABS
50.0000 mg | ORAL_TABLET | Freq: Every evening | ORAL | Status: DC | PRN
  Administered 2019-09-05 – 2019-09-15 (×8): 50 mg via ORAL
  Filled 2019-09-05 (×8): qty 1

## 2019-09-05 MED ORDER — POTASSIUM CHLORIDE CRYS ER 20 MEQ PO TBCR
40.0000 meq | EXTENDED_RELEASE_TABLET | Freq: Every day | ORAL | Status: DC
  Administered 2019-09-06 – 2019-09-08 (×3): 40 meq via ORAL
  Filled 2019-09-05 (×3): qty 2

## 2019-09-05 MED ORDER — ALUM & MAG HYDROXIDE-SIMETH 200-200-20 MG/5ML PO SUSP
30.0000 mL | Freq: Four times a day (QID) | ORAL | Status: DC | PRN
  Administered 2019-09-05 – 2019-09-06 (×2): 30 mL via ORAL
  Filled 2019-09-05 (×3): qty 30

## 2019-09-05 MED ORDER — POTASSIUM CHLORIDE CRYS ER 20 MEQ PO TBCR
20.0000 meq | EXTENDED_RELEASE_TABLET | Freq: Once | ORAL | Status: AC
  Administered 2019-09-05: 20 meq via ORAL
  Filled 2019-09-05: qty 1

## 2019-09-05 NOTE — Progress Notes (Signed)
Patient's CPAP refilled with sterile water and patient requires no further assistance placing it on.

## 2019-09-05 NOTE — Progress Notes (Signed)
NIF performed with great effort. > -40 cm H2O x3 VC 2.8L

## 2019-09-05 NOTE — Progress Notes (Signed)
ANTICOAGULATION CONSULT NOTE - Follow Up Consult  Pharmacy Consult for dabigatran Indication: history of atrial fibrillation  Allergies  Allergen Reactions  . Novocain [Procaine] Hives    Dentist appointment in 1968; since then has tolerated lidocaine and provocaine with no hives or difficulty.  . Pseudoephedrine Other (See Comments)    Patient went into afib  . Quinolones     Patient was warned about not using Cipro and similar antibiotics. Recent studies have raised concern that fluoroquinolone antibiotics could be associated with an increased risk of aortic aneurysm Fluoroquinolones have non-antimicrobial properties that might jeopardise the integrity of the extracellular matrix of the vascular wall In a  propensity score matched cohort study in [place], there was a 66% increased rate of aortic aneurysm or dissection associated with oral fluoroquinolone use, compared wit  . Sulfonamide Derivatives     Childhood reaction   . Cardizem [Diltiazem] Rash    Also 2019  . Pseudoephedrine Hcl Palpitations    Patient Measurements: Height: 6\' 1"  (185.4 cm) Weight: 259 lb 14.8 oz (117.9 kg) IBW/kg (Calculated) : 79.9   Vital Signs: Temp: 97.7 F (36.5 C) (09/06 0437) Temp Source: Oral (09/06 0437) BP: 126/62 (09/06 0437) Pulse Rate: 66 (09/06 0035)  Labs: Recent Labs    09/03/19 0408 09/04/19 0343 09/05/19 0316  HGB 15.1 14.5  --   HCT 46.2 43.4  --   PLT 147* 153  --   CREATININE 1.01 0.89 0.87    Estimated Creatinine Clearance: 107.8 mL/min (by C-G formula based on SCr of 0.87 mg/dL).   Medical History: Past Medical History:  Diagnosis Date  . Atrial fibrillation ([place]) 03/22/2009   a. s/p multiple DCCV; b. no coumadin due to low TE risk profile; c. Tikosyn Rx  . Coronary atherosclerosis of native coronary artery 11/2002   a. s/p stent to LAD 12/03; OM2 occluded at cath 12/03; d. myoview 5/10: no ischemia;  e. echo 7/11: EF 55%, BAE, mild RVE, PASP 41-45; Myoview was  in March 2013. There was no ischemia or infarction, EF 51%   . Cutaneous abscess of back excluding buttocks 07/04/2014   Appears to stem from possibly a cyst very large area 6 cm contact surgeon office   . Diabetes mellitus without complication ([place])   . Drusen body    see opth note  . ERECTILE DYSFUNCTION 03/22/2009  . GERD 03/22/2009  . HYPERGLYCEMIA 04/25/2010  . HYPERLIPIDEMIA 03/22/2009  . 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 LATERAL EPICONDYLITIS, LEFT 10/24/2009  . LIVER FUNCTION TESTS, ABNORMAL 04/25/2010  . Local reaction to immunization 05/05/2012   minor resolving  zostavax   . Myocardial infarction ([place]) mi2003  . Numbness in left leg    foot related to back disease and surgery  . Obesity, unspecified 04/24/2009  . Perforated appendicitis with necrosis s/p open appendectomy 06/07/14 06/04/2014  . Renal cyst    Characterized by MRI as simple  . Ruptured suppurative appendicitis    2015   . SLEEP APNEA, OBSTRUCTIVE 03/22/2009   compliant with CPAP  . THROMBOCYTOPENIA 08/16/2010  . TOBACCO USE, QUIT 10/24/2009  . 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 Umbilical hernia    Assessment: 68 yo male with atrial fibrillation on dabigatran prior to admission. Patient post-op left sternoclavicular wound debridement on 9/1. Pharmacy consulted to restart dabigatran for atrial fibrillation. Hgb and plt wnl and stable. No reported bleeding.   Goal of Therapy:   Monitor platelets by  anticoagulation protocol: Yes   Plan:  Continue dabigatran 150mg  twice daily  Monitor for signs/symptoms of bleeding  Cristela Felt, PharmD PGY1 Pharmacy Resident [employer]: [phone]  09/05/2019,8:02 AM

## 2019-09-05 NOTE — Progress Notes (Signed)
NIF >-40 [name]2O, VC 1.5 L/M

## 2019-09-05 NOTE — Progress Notes (Signed)
Inpatient Rehabilitation Admissions Coordinator  Pt with poor tolerance even to raise [facility] with therapy. Pain barrier to progression. Not at a level to be considered for an inpt rehab admit at this time. I will follow.  Danne Baxter, RN, MSN Rehab Admissions Coordinator [phone] 09/05/2019 10:29 AM

## 2019-09-05 NOTE — Progress Notes (Signed)
      [address]       [place] [address]             [phone]      5 Days Post-Op Procedure(s) (LRB): LEFT STERNOCLAVICULAR WOUND DEBRIDEMENT WITH APPLICATION OF WOUND VAC (Left)   Subjective:  Pain control has been better since addition of Flexeril and increasing Percocet  Objective: Vital signs in last 24 hours: Temp:  [97.5 F (36.4 C)-98.7 F (37.1 C)] 98.7 F (37.1 C) (09/06 0826) Pulse Rate:  [66-85] 85 (09/06 0833) Cardiac Rhythm: Normal sinus rhythm (09/06 0700) Resp:  [13-20] 17 (09/06 0833) BP: (122-135)/(62-76) 130/76 (09/06 0833) SpO2:  [89 %-97 %] 94 % (09/06 0835) FiO2 (%):  [36 %] 36 % (09/06 0835)  Intake/Output from previous day: 09/05 0701 - 09/06 0700 In: 1020 [P.O.:720; IV Piggyback:300] Out: 2350 [Urine:2350] Intake/Output this shift: Total I/O In: 120 [P.O.:120] Out: 450 [Urine:450]  General appearance: alert, cooperative and no distress Heart: regular rate and rhythm Lungs: clear to auscultation bilaterally Wound: wound vac in place  Lab Results: Recent Labs    09/03/19 0408 09/04/19 0343  WBC 10.0 10.4  HGB 15.1 14.5  HCT 46.2 43.4  PLT 147* 153   BMET:  Recent Labs    09/04/19 0343 09/05/19 0316  NA 138 138  K 3.4* 3.8  CL 102 103  CO2 27 30  GLUCOSE 157* 141*  BUN 17 15  CREATININE 0.89 0.87  CALCIUM 9.0 8.9    PT/INR: No results for input(s): LABPROT, INR in the last 72 hours. ABG    Component Value Date/Time   TCO2 26 03/01/2017 0258   CBG (last 3)  Recent Labs    09/04/19 1625 09/04/19 2126 09/05/19 0828  GLUCAP 142* 212* 155*    Assessment/Plan: S/P Procedure(s) (LRB): LEFT STERNOCLAVICULAR WOUND DEBRIDEMENT WITH APPLICATION OF WOUND VAC (Left)  1. S/P I&D [place] Joint abscess- wound vac due to be changed tomorrow by wound care 2. ID- remains afebrile, ABX per primary 3. Pain control- improved after increase in Percocet dose and addition of home Flexeril 4. dispo-patient stable,  wound vac change tomorrow via wound care, care per primary   LOS: 9 days    Erin Barrett 09/05/2019

## 2019-09-05 NOTE — Progress Notes (Signed)
Patient ID: [name] Santopietro., male   DOB: [birth year], 68 y.o.   MRN: [id number]  PROGRESS NOTE    [name] Chimes.  [id number] DOB: 09-20-1951 DOA: 08/27/2019 PCP: Burnis Medin, MD   Brief Narrative:  68 year old male with history of OSA on CPAP, perforated appendicitis in 2015, CAD status post stents, hyperlipidemia, diabetes mellitus type 2, optic neuritis with recent hospitalization at [facility] treated with intravenous and subsequent oral steroids, recent right lower extremity cellulitis treated as an outpatient with oral doxycycline, A. fib on Pradaxa presented with back pain and progressively worsening difficulty walking.  Neurology was consulted.  He was found to have MSSA bacteremia with question of L4-L5 discitis/osteomyelitis.  He was started on Ancef.  ID was consulted.  Assessment & Plan: MSSA bacteremia Currently afebrile, with no leukocytosis Source likely L4-L5 discitis/osteomyelitis, septic arthritis/possible osteomyelitis of the sternoclavicular joint space Repeat blood culture from 08/29/2019 has been negative till date CRP 6.1 Echo showed no valvular vegetations Neurosurgery/Dr. Annette Stable was consulted by phone on 08/28/2019 and he reviewed the MRI lumbar spine and recommended no surgical intervention at this time ID on board, recommend changing to nafcillin due to possible CNS involvement, will need 2 weeks, with de-escalation back to cefazolin for a total duration of IV antibiotics for 6 weeks given concern for associated discitis Per ID, plan for Nafcillin x 14 days through 9/16/20210 then change to cefazolin on 9/17 through 10/12/19 Continue IV nafcillin  Infected left sternoclavicular joint s/p I&D/debridement with application of wound VAC by CTS Surgical/deep tissue culture growing stap aureus  Cardiothoracic surgeons on board, appreciate recs Continue IV nafcillin  Left shoulder pain Improving MRI showed no evidence of septic arthritis, rotator cuff tendinosis,  biceps tendinosis with probable interstitial tear of the intra-articular portion, 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  Neurology is not convinced about this for now. CTA of the head and neck showed no evidence of CNS vasculitis Neurology consulted, appreciate further recs  Thrombocytopenia Resolved Unclear etiology, no signs of bleeding/bruising Daily CBC  Paroxysmal A. Fib Rate controlled Continue Tikosyn, check K+/Mg daily Continue Pradaxa  Diabetes mellitus type 2 A1c 6.7 on 07/21/2019 SSI, Accu-Cheks, hypoglycemic protocol  Hyperlipidemia Continue Lipitor  OSA Continue CPAP at bedside  Obesity Lifestyle modification advised  Generalized deconditioning/weakness PT/OT on board    DVT prophylaxis: Pradaxa Code Status: Full Family Communication: None at bedside Disposition Plan: CIR-not at level yet, will be following    Consultants Neurology Orthopedics ID IR Spoke to neurosurgery/Dr. Annette Stable on 08/28/2019 CTS  Procedures:  Echo IMPRESSIONS    1. The left ventricle has normal systolic function with an ejection fraction of 60-65%. The cavity size was normal. There is mildly increased left ventricular wall thickness. Left ventricular diastolic Doppler parameters are consistent with impaired  relaxation. Indeterminate filling pressures The E/e' is 8-15. No evidence of left ventricular regional wall motion abnormalities.  2. The right ventricle has normal systolic function. The cavity was normal. There is no increase in  right ventricular wall thickness.  3. Left atrial size was mildly dilated.  4. The mitral valve is abnormal. Mild thickening of the mitral valve leaflet.  No mitral valve vegetation visualized.  5. The tricuspid valve is grossly normal.  6. No vegetation on the aortic valve.  7. The aortic valve is tricuspid. Mild sclerosis of the aortic valve. Aortic valve regurgitation is trivial by color flow Doppler. No stenosis of the aortic valve.  8. The aorta is normal unless otherwise noted.  SUMMARY   LVEF 60-65%, mild LVH, normal wall motion, grade 1 DD, indeterminate LV filling pressure, normal RV function, mild LAE, aortic valve sclerosis with trivial AI, no valvular vegetations  Antimicrobials: Nafcillin   Subjective: Today, patient reports pain is better, denies any new complaints.  Reports poor sleep at night, encouraged to use CPAP every night.   Objective: Vitals:   09/05/19 0835 09/05/19 1040 09/05/19 1238 09/05/19 1239  BP:    130/78  Pulse:    71  Resp:    17  Temp:  97.8 F (36.6 C) 97.8 F (36.6 C)   TempSrc:  Oral Oral   SpO2: 94%   96%  Weight:      Height:        Intake/Output Summary (Last 24 hours) at 09/05/2019 1537 Last data filed at 09/05/2019 1015 Gross per 24 hour  Intake 900 ml  Output 1600 ml  Net -700 ml   Filed Weights   08/29/19 0500 08/30/19 0416  Weight: 118 kg 117.9 kg    Examination:  General: NAD   Cardiovascular: S1, S2 present  Respiratory:  Diminished breath sounds bilaterally, chest wound VAC noted  Abdomen: Soft, nontender, nondistended, bowel sounds present  Musculoskeletal: No bilateral pedal edema noted  Skin: Normal  Psychiatry: Normal mood  Data Reviewed: I have personally reviewed following labs and imaging studies  CBC: Recent Labs  Lab 08/31/19 1053 09/01/19 0252 09/02/19 0324 09/03/19 0408 09/04/19 0343  WBC 10.7* 12.3* 11.6* 10.0 10.4  NEUTROABS 7.6 9.9* 7.3 6.8 7.3  HGB 15.8 15.4 15.8 15.1 14.5  HCT  45.3 46.0 46.1 46.2 43.4  MCV 89.0 90.6 90.0 93.3 91.8  PLT 88* 101* 122* 147* 0000000   Basic Metabolic Panel: Recent Labs  Lab 09/01/19 0252 09/02/19 0324 09/03/19 0408 09/04/19 0343 09/05/19 0316  NA 135 137 138 138 138  K 4.3 3.7 3.6 3.4* 3.8  CL 97* 100 101 102 103  CO2 27 26 28 27 30   GLUCOSE 181* 153* 165* 157* 141*  BUN 17 23 22 17 15   CREATININE 0.99 1.09 1.01 0.89 0.87  CALCIUM 9.2 9.2 9.1 9.0 8.9  MG 1.9 2.1 1.9 1.8 1.8   GFR: Estimated Creatinine Clearance: 107.8 mL/min (by C-G formula based on SCr of 0.87 mg/dL). Liver Function Tests: No results for input(s): AST, ALT, ALKPHOS, BILITOT, PROT, ALBUMIN in the last 168 hours. No results for input(s): LIPASE, AMYLASE in the last 168 hours. No results for input(s): AMMONIA in the last 168 hours. Coagulation Profile: Recent Labs  Lab 08/31/19 1328  INR 1.2   Cardiac Enzymes: No results for input(s): CKTOTAL, CKMB, CKMBINDEX, TROPONINI in the last 168 hours. BNP (last 3 results) No results for input(s): PROBNP in the last 8760 hours. HbA1C: No results for input(s): HGBA1C in the last 72 hours. CBG: Recent Labs  Lab 09/04/19 1259 09/04/19 1625 09/04/19 2126 09/05/19 0828 09/05/19 1240  GLUCAP 156* 142* 212* 155* 149*   Lipid Profile: No results for input(s): CHOL, HDL, LDLCALC, TRIG, CHOLHDL, LDLDIRECT in the last 72 hours. Thyroid Function Tests: No results for input(s): TSH, T4TOTAL, FREET4, T3FREE, THYROIDAB in the last 72  hours. Anemia Panel: No results for input(s): VITAMINB12, FOLATE, FERRITIN, TIBC, IRON, RETICCTPCT in the last 72 hours. Sepsis Labs: No results for input(s): PROCALCITON, LATICACIDVEN in the last 168 hours.  Recent Results (from the past 240 hour(s))  Culture, blood (routine x 2)     Status: Abnormal   Collection Time: 08/27/19  8:54 AM   Specimen: BLOOD  Result Value Ref Range Status   Specimen Description BLOOD RIGHT ANTECUBITAL  Final   Special Requests   Final    BOTTLES  DRAWN AEROBIC ONLY Blood Culture adequate volume   Culture  Setup Time   Final    GRAM POSITIVE COCCI IN CLUSTERS AEROBIC BOTTLE ONLY CRITICAL RESULT CALLED TO, READ BACK BY AND VERIFIED WITH: G. ABBOTT,PHARMD 0217 08/28/2019 T. TYSOR    Culture (A)  Final    STAPHYLOCOCCUS AUREUS SUSCEPTIBILITIES PERFORMED ON PREVIOUS CULTURE WITHIN THE LAST 5 DAYS. Performed at [facility], [place] [address], [place], [place] [address]    Report Status 08/29/2019 FINAL  Final  SARS CORONAVIRUS 2 (TAT 6-12 HRS) Nasal Swab Aptima Multi Swab     Status: None   Collection Time: 08/27/19  9:09 AM   Specimen: Aptima Multi Swab; Nasal Swab  Result Value Ref Range Status   SARS Coronavirus 2 NEGATIVE NEGATIVE Final    Comment: (NOTE) SARS-CoV-2 target nucleic acids are NOT DETECTED. The SARS-CoV-2 RNA is generally detectable in upper and lower respiratory specimens during the acute phase of infection. Negative results do not preclude SARS-CoV-2 infection, do not rule out co-infections with other pathogens, and should not be used as the sole basis for treatment or other patient management decisions. Negative results must be combined with clinical observations, patient history, and epidemiological information. The expected result is Negative. Fact Sheet for Patients: SugarRoll.be Fact Sheet for Healthcare Providers: https://www.woods-mathews.com/ This test is not yet approved or cleared by the [place] FDA and  has been authorized for detection and/or diagnosis of SARS-CoV-2 by FDA under an Emergency Use Authorization (EUA). This EUA will remain  in effect (meaning this test can be used) for the duration of the COVID-19 declaration under Section 56 4(b)(1) of the Act, 21 U.S.C. section 360bbb-3(b)(1), unless the authorization is terminated or revoked sooner. Performed at [facility], [place] [address], [place], [place] [address]   Culture, blood  (routine x 2)     Status: Abnormal   Collection Time: 08/27/19  9:51 AM   Specimen: BLOOD RIGHT ARM  Result Value Ref Range Status   Specimen Description BLOOD RIGHT ARM  Final   Special Requests   Final    BOTTLES DRAWN AEROBIC AND ANAEROBIC Blood Culture adequate volume   Culture  Setup Time   Final    GRAM POSITIVE COCCI IN CLUSTERS IN BOTH AEROBIC AND ANAEROBIC BOTTLES CRITICAL RESULT CALLED TO, READ BACK BY AND VERIFIED WITH: G. ABBOTT,PHARMD 0217 08/28/2019 Mena Goes Performed at [facility], [place] [address], [place], [place] [address]    Culture STAPHYLOCOCCUS AUREUS (A)  Final   Report Status 08/29/2019 FINAL  Final   Organism ID, Bacteria STAPHYLOCOCCUS AUREUS  Final      Susceptibility   Staphylococcus aureus - MIC*    CIPROFLOXACIN <=0.5 SENSITIVE Sensitive     ERYTHROMYCIN <=0.25 SENSITIVE Sensitive     GENTAMICIN <=0.5 SENSITIVE Sensitive     OXACILLIN <=0.25 SENSITIVE Sensitive     TETRACYCLINE <=1 SENSITIVE Sensitive     VANCOMYCIN <=0.5 SENSITIVE Sensitive  TRIMETH/SULFA <=10 SENSITIVE Sensitive     CLINDAMYCIN <=0.25 SENSITIVE Sensitive     RIFAMPIN <=0.5 SENSITIVE Sensitive     Inducible Clindamycin NEGATIVE Sensitive     * STAPHYLOCOCCUS AUREUS  Blood Culture ID Panel (Reflexed)     Status: Abnormal   Collection Time: 08/27/19  9:51 AM  Result Value Ref Range Status   Enterococcus species NOT DETECTED NOT DETECTED Final   Listeria monocytogenes NOT DETECTED NOT DETECTED Final   Staphylococcus species DETECTED (A) NOT DETECTED Final    Comment: CRITICAL RESULT CALLED TO, READ BACK BY AND VERIFIED WITH: G. ABBOTT,PHARMD 0217 08/28/2019 T. TYSOR    Staphylococcus aureus (BCID) DETECTED (A) NOT DETECTED Final    Comment: Methicillin (oxacillin) susceptible Staphylococcus aureus (MSSA). Preferred therapy is anti staphylococcal beta lactam antibiotic (Cefazolin or Nafcillin), unless clinically contraindicated. CRITICAL RESULT CALLED TO, READ BACK BY AND  VERIFIED WITH: G. ABBOTT,PHARMD 0217 08/28/2019 T. TYSOR    Methicillin resistance NOT DETECTED NOT DETECTED Final   Streptococcus species NOT DETECTED NOT DETECTED Final   Streptococcus agalactiae NOT DETECTED NOT DETECTED Final   Streptococcus pneumoniae NOT DETECTED NOT DETECTED Final   Streptococcus pyogenes NOT DETECTED NOT DETECTED Final   Acinetobacter baumannii NOT DETECTED NOT DETECTED Final   Enterobacteriaceae species NOT DETECTED NOT DETECTED Final   Enterobacter cloacae complex NOT DETECTED NOT DETECTED Final   Escherichia coli NOT DETECTED NOT DETECTED Final   Klebsiella oxytoca NOT DETECTED NOT DETECTED Final   Klebsiella pneumoniae NOT DETECTED NOT DETECTED Final   Proteus species NOT DETECTED NOT DETECTED Final   Serratia marcescens NOT DETECTED NOT DETECTED Final   Haemophilus influenzae NOT DETECTED NOT DETECTED Final   Neisseria meningitidis NOT DETECTED NOT DETECTED Final   Pseudomonas aeruginosa NOT DETECTED NOT DETECTED Final   Candida albicans NOT DETECTED NOT DETECTED Final   Candida glabrata NOT DETECTED NOT DETECTED Final   Candida krusei NOT DETECTED NOT DETECTED Final   Candida parapsilosis NOT DETECTED NOT DETECTED Final   Candida tropicalis NOT DETECTED NOT DETECTED Final    Comment: Performed at [facility], [place]. [address], [place], [place] [address]  MRSA PCR Screening     Status: None   Collection Time: 08/28/19  7:54 AM   Specimen: Nasal Mucosa; Nasopharyngeal  Result Value Ref Range Status   MRSA by PCR NEGATIVE NEGATIVE Final    Comment:        The GeneXpert MRSA Assay (FDA approved for NASAL specimens only), is one component of a comprehensive MRSA colonization surveillance program. It is not intended to diagnose MRSA infection nor to guide or monitor treatment for MRSA infections. Performed at [facility], [place] [address], [place], [place] [address]   Culture, blood (routine x 2)     Status: None   Collection Time:  08/29/19  5:48 AM   Specimen: BLOOD  Result Value Ref Range Status   Specimen Description BLOOD RIGHT HAND  Final   Special Requests   Final    BOTTLES DRAWN AEROBIC ONLY Blood Culture adequate volume   Culture   Final    NO GROWTH 5 DAYS Performed at [facility], [place] [address], [place], [place] [address]    Report Status 09/03/2019 FINAL  Final  Culture, blood (routine x 2)     Status: None   Collection Time: 08/29/19  5:51 AM   Specimen: BLOOD  Result Value Ref Range Status   Specimen Description BLOOD LEFT ARM  Final  Special Requests   Final    BOTTLES DRAWN AEROBIC AND ANAEROBIC Blood Culture adequate volume   Culture   Final    NO GROWTH 5 DAYS Performed at [facility], [place] [address], [place], [place] [address]    Report Status 09/03/2019 FINAL  Final  Surgical PCR screen     Status: None   Collection Time: 08/31/19  1:47 PM   Specimen: Nasal Mucosa; Nasal Swab  Result Value Ref Range Status   MRSA, PCR NEGATIVE NEGATIVE Final   Staphylococcus aureus NEGATIVE NEGATIVE Final    Comment: (NOTE) The Xpert SA Assay (FDA approved for NASAL specimens in patients 45 years of age and older), is one component of a comprehensive surveillance program. It is not intended to diagnose infection nor to guide or monitor treatment. Performed at [facility], [place] [address], [place], [place] [address]   Fungus Culture With Stain     Status: None (Preliminary result)   Collection Time: 08/31/19  3:24 PM   Specimen: Other Source; Body Fluid  Result Value Ref Range Status   Fungus Stain Final report  Final    Comment: (NOTE) Performed At: [facility] [place], [place] [id number] Rush Farmer MD [id number]    Fungus (Mycology) Culture PENDING  Incomplete   Fungal Source JOINT FLUID  Final    Comment: STERNOCLAVICULAR Performed at [facility], [place] [address], [place], [place] [address]   Aerobic/Anaerobic Culture  (surgical/deep wound)     Status: None   Collection Time: 08/31/19  3:24 PM   Specimen: Other Source; Body Fluid  Result Value Ref Range Status   Specimen Description JOINT FLUID  Final   Special Requests LEFT STERNOCLAVICULAR  Final   Gram Stain   Final    ABUNDANT WBC PRESENT,BOTH PMN AND MONONUCLEAR FEW GRAM POSITIVE COCCI    Culture   Final    FEW STAPHYLOCOCCUS AUREUS NO ANAEROBES ISOLATED Performed at [facility], [place] [address], [place], [place] [address]    Report Status 09/05/2019 FINAL  Final   Organism ID, Bacteria STAPHYLOCOCCUS AUREUS  Final      Susceptibility   Staphylococcus aureus - MIC*    CIPROFLOXACIN <=0.5 SENSITIVE Sensitive     ERYTHROMYCIN <=0.25 SENSITIVE Sensitive     GENTAMICIN <=0.5 SENSITIVE Sensitive     OXACILLIN <=0.25 SENSITIVE Sensitive     TETRACYCLINE <=1 SENSITIVE Sensitive     VANCOMYCIN 1 SENSITIVE Sensitive     TRIMETH/SULFA <=10 SENSITIVE Sensitive     CLINDAMYCIN <=0.25 SENSITIVE Sensitive     RIFAMPIN <=0.5 SENSITIVE Sensitive     Inducible Clindamycin NEGATIVE Sensitive     * FEW STAPHYLOCOCCUS AUREUS  Acid Fast Smear (AFB)     Status: None   Collection Time: 08/31/19  3:24 PM   Specimen: Other Source; Body Fluid  Result Value Ref Range Status   AFB Specimen Processing Concentration  Final   Acid Fast Smear Negative  Final    Comment: (NOTE) Performed At: [facility] [place], [place] [id number] Rush Farmer MD [id number]    Source (AFB) JOINT FLUID  Final    Comment: STERNOCLAVICULAR Performed at [facility], [place] [address] [place], [place] [address]   Fungus Culture Result     Status: None   Collection Time: 08/31/19  3:24 PM  Result Value Ref Range Status   Result 1 Comment  Final    Comment: (NOTE) KOH/Calcofluor  preparation:  no fungus observed. Performed At: [facility] [place], [place] [id number] Rush Farmer MD [id number]           Radiology Studies: No results found.      Scheduled Meds: . atorvastatin  80 mg Oral QHS  . brimonidine  1 drop Both Eyes TID  . dabigatran  150 mg Oral Q12H  . dofetilide  500 mcg Oral BID  . fluticasone furoate-vilanterol  1 puff Inhalation Daily  . insulin aspart  0-15 Units Subcutaneous TID WC  . insulin aspart  0-5 Units Subcutaneous QHS  . ipratropium-albuterol  3 mL Nebulization BID  . loratadine  10 mg Oral Daily  . pantoprazole  40 mg Oral Daily  . polyethylene glycol  17 g Oral BID  . potassium chloride  20 mEq Oral Once  . [START ON 09/06/2019] potassium chloride  40 mEq Oral Daily  . senna-docusate  1 tablet Oral BID  . sodium chloride flush  10-40 mL Intracatheter Q12H   Continuous Infusions: . [START ON 09/16/2019]  ceFAZolin (ANCEF) IV    . nafcillin IV 2 g (09/05/19 1307)     LOS: 9 days        Alma Friendly, MD [facility] 09/05/2019, 3:37 PM

## 2019-09-06 LAB — BASIC METABOLIC PANEL
Anion gap: 10 (ref 5–15)
BUN: 13 mg/dL (ref 8–23)
CO2: 27 mmol/L (ref 22–32)
Calcium: 9 mg/dL (ref 8.9–10.3)
Chloride: 101 mmol/L (ref 98–111)
Creatinine, Ser: 0.91 mg/dL (ref 0.61–1.24)
GFR calc Af Amer: >60 mL/min (ref >60)
GFR calc non Af Amer: >60 mL/min (ref >60)
Glucose, Bld: 132 mg/dL — ABNORMAL HIGH (ref 70–99)
Potassium: 3.6 mmol/L (ref 3.5–5.1)
Sodium: 138 mmol/L (ref 135–145)

## 2019-09-06 LAB — MAGNESIUM: Magnesium: 1.8 mg/dL (ref 1.7–2.4)

## 2019-09-06 LAB — GLUCOSE, CAPILLARY
Glucose-Capillary: 168 mg/dL — ABNORMAL HIGH (ref 70–99)
Glucose-Capillary: 95 mg/dL (ref 70–99)
Glucose-Capillary: 124 mg/dL — ABNORMAL HIGH (ref 70–99)

## 2019-09-06 MED ORDER — POTASSIUM CHLORIDE CRYS ER 20 MEQ PO TBCR
40.0000 meq | EXTENDED_RELEASE_TABLET | Freq: Once | ORAL | Status: AC
  Administered 2019-09-06: 40 meq via ORAL
  Filled 2019-09-06: qty 2

## 2019-09-06 NOTE — Progress Notes (Signed)
NIF was greater than -40 VC 2.5L   Both with great effort

## 2019-09-06 NOTE — Progress Notes (Signed)
RT NOTE:  NIF > 40 VC 2.2L  Pt completed both with great effort.

## 2019-09-06 NOTE — Progress Notes (Signed)
Inpatient Rehabilitation Admissions Coordinator  Patient just sat up side of bed with CNA for the first time. I encouraged him to work through any pain or discomfort to progress up out of the bed due to high risk for complications if not. I await pt's ability to tolerate more intense therapies.  Danne Baxter, RN, MSN Rehab Admissions Coordinator [phone] 09/06/2019 12:52 PM

## 2019-09-06 NOTE — Progress Notes (Signed)
Physical Therapy Treatment Patient Details Name: [name] [name]. MRN: [id number] DOB: 02-04-1951 Today's Date: 09/06/2019    History of Present Illness Broderick Collamore. is a 68 y.o. male with medical history significant of OSA on CPAP; perforated appy (2015); obesity; CAD s/p stents; HLD; DM; optic nerve neuritis; and afib on Pradaxa presenting with back pain.    PT Comments    Pt tolerated full upright sitting EOB x5 mins today. Attempted scooting EOB for WB'ing through LE's but pt unable tolerate this yet.  Bed into chair position after session and pt tolerated well. Encouraged him to have bed in this position for meals. PT will continue to follow.    Follow Up Recommendations  CIR     Equipment Recommendations  Other (comment)(TBD next venue)    Recommendations for Other Services       Precautions / Restrictions Precautions Precautions: Fall Precaution Comments: wound vac L [place] joint Restrictions Weight Bearing Restrictions: No Other Position/Activity Restrictions: wound vac intact    Mobility  Bed Mobility Overal bed mobility: Needs Assistance Bed Mobility: Supine to Sit;Sit to Supine Rolling: Mod assist Sidelying to sit: Mod assist;+2 for physical assistance     Sit to sidelying: Max assist;+2 for physical assistance General bed mobility comments: pt able to roll to R and grasp R hand with L rail to pull self into SL. Needed mod A for LE's off bed and elevation of trunk into sitting. Tolerated full upright sitting today  Transfers                 General transfer comment: attempted lateral scoot along EOB and pt able to push through feet minimally but not enough to scoot buttocks  Ambulation/Gait             General Gait Details: unable   Stairs             Wheelchair Mobility    Modified Rankin (Stroke Patients Only)       Balance Overall balance assessment: Needs assistance Sitting-balance support: Feet supported;Bilateral  upper extremity supported Sitting balance-Leahy Scale: Fair Sitting balance - Comments: able to tolerate 5 mins sitting but then began to have cramping of low back and needed to return to lying                                    Cognition Arousal/Alertness: Awake/alert Behavior During Therapy: WFL for tasks assessed/performed Overall Cognitive Status: Within Functional Limits for tasks assessed                                        Exercises      General Comments General comments (skin integrity, edema, etc.): pt tolerated placing bed in chair position after session and feels he could try this for meals      Pertinent Vitals/Pain Pain Assessment: Faces Faces Pain Scale: Hurts whole lot Pain Location: lower back and R shoulder above PICC Pain Descriptors / Indicators: Discomfort;Grimacing;Guarding Pain Intervention(s): Limited activity within patient's tolerance;Monitored during session;Patient requesting pain meds-RN notified    Home Living                      Prior Function            PT Goals (current goals can now be  found in the care plan section) Acute Rehab PT Goals Patient Stated Goal: to decrease pain PT Goal Formulation: With patient Time For Goal Achievement: 09/13/19 Potential to Achieve Goals: Good Progress towards PT goals: Progressing toward goals    Frequency    Min 3X/week      PT Plan Current plan remains appropriate    Co-evaluation              AM-PAC PT "6 Clicks" Mobility   Outcome Measure  Help needed turning from your back to your side while in a flat bed without using bedrails?: A Lot Help needed moving from lying on your back to sitting on the side of a flat bed without using bedrails?: Total Help needed moving to and from a bed to a chair (including a wheelchair)?: Total Help needed standing up from a chair using your arms (e.g., wheelchair or bedside chair)?: Total Help needed to walk  in hospital room?: Total Help needed climbing 3-5 steps with a railing? : Total 6 Click Score: 7    End of Session Equipment Utilized During Treatment: Oxygen Activity Tolerance: Patient limited by pain Patient left: in bed;with call bell/phone within reach Nurse Communication: Mobility status PT Visit Diagnosis: Muscle weakness (generalized) (M62.81);Other abnormalities of gait and mobility (R26.89);Pain;Difficulty in walking, not elsewhere classified (R26.2) Pain - Right/Left: (low back, B shoulders) Pain - part of body: Shoulder     Time: [id number] PT Time Calculation (min) (ACUTE ONLY): 36 min  Charges:  $Therapeutic Activity: 23-37 mins                     Leighton Roach, PT  Acute Rehab Services  Pager [phone] Office [place] 09/06/2019, 12:59 PM

## 2019-09-06 NOTE — Progress Notes (Addendum)
      [address]       [place] [address]             [phone]      6 Days Post-Op Procedure(s) (LRB): LEFT STERNOCLAVICULAR WOUND DEBRIDEMENT WITH APPLICATION OF WOUND VAC (Left)   Subjective:  Patient complaining of increased back pain. He states this is a major source of his discomfort.  He states he was due to see his neurosurgeon but ended up in the hospital.   Objective: Vital signs in last 24 hours: Temp:  [97.6 F (36.4 C)-98.4 F (36.9 C)] 97.8 F (36.6 C) (09/07 0806) Pulse Rate:  [64-77] 64 (09/07 0806) Cardiac Rhythm: Normal sinus rhythm (09/07 0700) Resp:  [13-22] 13 (09/07 0806) BP: (124-136)/(67-79) 130/70 (09/07 0806) SpO2:  [93 %-98 %] 98 % (09/07 0806)  Intake/Output from previous day: 09/06 0701 - 09/07 0700 In: 660 [P.O.:360; IV Piggyback:300] Out: 1300 [Urine:1300] Intake/Output this shift: Total I/O In: 240 [P.O.:240] Out: 600 [Urine:600]  General appearance: alert, cooperative and no distress Heart: regular rate and rhythm Lungs: clear to auscultation bilaterally Wound: wound vac in place  Lab Results: Recent Labs    09/04/19 0343  WBC 10.4  HGB 14.5  HCT 43.4  PLT 153   BMET:  Recent Labs    09/05/19 0316 09/06/19 0340  NA 138 138  K 3.8 3.6  CL 103 101  CO2 30 27  GLUCOSE 141* 132*  BUN 15 13  CREATININE 0.87 0.91  CALCIUM 8.9 9.0    PT/INR: No results for input(s): LABPROT, INR in the last 72 hours. ABG    Component Value Date/Time   TCO2 26 03/01/2017 0258   CBG (last 3)  Recent Labs    09/05/19 1240 09/05/19 1637 09/05/19 2140  GLUCAP 149* 116* 153*    Assessment/Plan: S/P Procedure(s) (LRB): LEFT STERNOCLAVICULAR WOUND DEBRIDEMENT WITH APPLICATION OF WOUND VAC (Left)  1. S/P I&D [place] [facility]- for wound vac change today via wound care 2. ID- remains afebrile, on ABX per primary 3. Increased back pain- patient states was due to see Neurosurgeon, but then he got admitted to the hospital..  patient is limited in mobility due to pain, but would benefit from being up and out of bed 4. Dispo- care per primary, wound vac change today by wound care, continue care per primary   LOS: 10 days    Erin Barrett 09/06/2019  vac sponge changed today by wound nurse patient examined and medical record reviewed,agree with above note. Tharon Aquas Trigt III 09/06/2019

## 2019-09-06 NOTE — Consult Note (Signed)
[place] Nurse wound consult note Patient receiving care in [facility] [phone]. Reason for Consult: VAC dressing change to Left [place] joint Wound type: infectious, surgical Wound bed: pink Drainage (amount, consistency, odor) no purulence, no odor Periwound: intact Dressing procedure/placement/frequency: one piece of black foam removed, one piece placed into wound bed. Immediate seal obtained. Patient tolerated procedure well.  Val Riles, RN, MSN, CWOCN, CNS-BC, pager [phone]

## 2019-09-06 NOTE — Progress Notes (Addendum)
Patient ID: [name] [name]., male   DOB: 1951-02-02, 67 y.o.   MRN: [id number]  PROGRESS NOTE    [name].  [id number] DOB: Jul 28, 1951 DOA: 08/27/2019 PCP: Burnis Medin, MD   Brief Narrative:  68 year old male with history of OSA on CPAP, perforated appendicitis in 2015, CAD status post stents, hyperlipidemia, diabetes mellitus type 2, optic neuritis with recent hospitalization at [facility] treated with intravenous and subsequent oral steroids, recent right lower extremity cellulitis treated as an outpatient with oral doxycycline, A. fib on Pradaxa presented with back pain and progressively worsening difficulty walking.  Neurology was consulted.  He was found to have MSSA bacteremia with question of L4-L5 discitis/osteomyelitis.  He was started on Ancef.  ID was consulted.  Assessment & Plan: MSSA bacteremia Currently afebrile, with no leukocytosis Source likely L4-L5 discitis/osteomyelitis, septic arthritis/possible osteomyelitis of the sternoclavicular joint space Repeat blood culture from 08/29/2019 has been negative till date CRP 6.1 Echo showed no valvular vegetations Neurosurgery/Dr. Annette Stable was consulted by phone on 08/28/2019 and he reviewed the MRI lumbar spine and recommended no surgical intervention at this time ID on board, recommend changing to nafcillin due to possible CNS involvement, will need 2 weeks, with de-escalation back to cefazolin for a total duration of IV antibiotics for 6 weeks given concern for associated discitis Per ID, plan for Nafcillin x 14 days through 9/16/20210 then change to cefazolin on 9/17 through 10/12/19 Continue IV nafcillin, cefazolin  Infected left sternoclavicular joint s/p I&D/debridement with application of wound VAC by CTS Surgical/deep tissue culture growing stap aureus  Cardiothoracic surgeons on board, appreciate recs Continue IV nafcillin, cefazolin  Left shoulder pain Improving MRI showed no evidence of septic arthritis,  rotator cuff tendinosis, biceps tendinosis with probable interstitial tear of the intra-articular portion, 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  Neurology is not convinced about this for now. CTA of the head and neck showed no evidence of CNS vasculitis Neurology consulted, appreciate further recs  Thrombocytopenia Resolved Unclear etiology, no signs of bleeding/bruising Daily CBC  Paroxysmal A. Fib Rate controlled Continue Tikosyn, check K+/Mg daily Continue Pradaxa  Diabetes mellitus type 2 A1c 6.7 on 07/21/2019 SSI, Accu-Cheks, hypoglycemic protocol  Hyperlipidemia Continue Lipitor  OSA Continue CPAP at bedside  Obesity Lifestyle modification advised  Generalized deconditioning/weakness PT/OT on board    DVT prophylaxis: Pradaxa Code Status: Full Family Communication: None at bedside Disposition Plan: CIR-not at level yet, will be following    Consultants Neurology Orthopedics ID IR Spoke to neurosurgery/Dr. Annette Stable on 08/28/2019 CTS  Procedures:  Echo IMPRESSIONS    1. The left ventricle has normal systolic function with an ejection fraction of 60-65%. The cavity size was normal. There is mildly increased left ventricular wall thickness. Left ventricular diastolic Doppler parameters are consistent with impaired  relaxation. Indeterminate filling pressures The E/e' is 8-15. No evidence of left ventricular regional wall motion abnormalities.  2. The right ventricle has normal systolic function. The cavity was normal.  There is no increase in right ventricular wall thickness.  3. Left atrial size was mildly dilated.  4. The mitral valve is abnormal. Mild thickening of the mitral  valve leaflet. No mitral valve vegetation visualized.  5. The tricuspid valve is grossly normal.  6. No vegetation on the aortic valve.  7. The aortic valve is tricuspid. Mild sclerosis of the aortic valve. Aortic valve regurgitation is trivial by color flow Doppler. No stenosis of the aortic valve.  8. The aorta is normal unless otherwise noted.  SUMMARY   LVEF 60-65%, mild LVH, normal wall motion, grade 1 DD, indeterminate LV filling pressure, normal RV function, mild LAE, aortic valve sclerosis with trivial AI, no valvular vegetations  Antimicrobials: Nafcillin   Subjective: Patient denies any new complaints.  Complaining of some chronic back pain.  Encouraged to try to participate in physical therapy, as well as trying to get more mobile   Objective: Vitals:   09/06/19 0024 09/06/19 0435 09/06/19 0806 09/06/19 1227  BP:  124/77 130/70   Pulse:  67 64 69  Resp:  13 13 16   Temp: 98 F (36.7 C) 98.4 F (36.9 C) 97.8 F (36.6 C) 97.7 F (36.5 C)  TempSrc: Axillary Oral Axillary Oral  SpO2:  94% 98% 94%  Weight:      Height:        Intake/Output Summary (Last 24 hours) at 09/06/2019 1434 Last data filed at 09/06/2019 0900 Gross per 24 hour  Intake 540 ml  Output 1450 ml  Net -910 ml   Filed Weights   08/29/19 0500 08/30/19 0416  Weight: 118 kg 117.9 kg    Examination:  General: NAD   Cardiovascular: S1, S2 present  Respiratory:  Diminished breath sounds bilaterally, chest wound VAC noted  Abdomen: Soft, nontender, nondistended, bowel sounds present  Musculoskeletal: No bilateral pedal edema noted  Skin: Normal  Psychiatry: Normal mood   Data Reviewed: I have personally reviewed following labs and imaging studies  CBC: Recent Labs  Lab 08/31/19 1053 09/01/19 0252 09/02/19 0324  09/03/19 0408 09/04/19 0343  WBC 10.7* 12.3* 11.6* 10.0 10.4  NEUTROABS 7.6 9.9* 7.3 6.8 7.3  HGB 15.8 15.4 15.8 15.1 14.5  HCT 45.3 46.0 46.1 46.2 43.4  MCV 89.0 90.6 90.0 93.3 91.8  PLT 88* 101* 122* 147* 0000000   Basic Metabolic Panel: Recent Labs  Lab 09/02/19 0324 09/03/19 0408 09/04/19 0343 09/05/19 0316 09/06/19 0340  NA 137 138 138 138 138  K 3.7 3.6 3.4* 3.8 3.6  CL 100 101 102 103 101  CO2 26 28 27 30 27   GLUCOSE 153* 165* 157* 141* 132*  BUN 23 22 17 15 13   CREATININE 1.09 1.01 0.89 0.87 0.91  CALCIUM 9.2 9.1 9.0 8.9 9.0  MG 2.1 1.9 1.8 1.8 1.8   GFR: Estimated Creatinine Clearance: 103.1 mL/min (by C-G formula based on SCr of 0.91 mg/dL). Liver Function Tests: No results for input(s): AST, ALT, ALKPHOS, BILITOT, PROT, ALBUMIN in the last 168 hours. No results for input(s): LIPASE, AMYLASE in the last 168 hours. No results for input(s): AMMONIA in the last 168 hours. Coagulation Profile: Recent Labs  Lab 08/31/19 1328  INR 1.2   Cardiac Enzymes: No results for input(s): CKTOTAL, CKMB, CKMBINDEX, TROPONINI in the last 168 hours. BNP (last 3 results) No results for input(s): PROBNP in the last 8760 hours. HbA1C: No results for input(s): HGBA1C in the last 72 hours. CBG: Recent Labs  Lab 09/05/19 0828 09/05/19 1240 09/05/19 1637 09/05/19 2140 09/06/19 1151  GLUCAP 155* 149* 116* 153* 168*   Lipid Profile: No results for input(s): CHOL, HDL, LDLCALC, TRIG, CHOLHDL, LDLDIRECT in the last 72 hours.  Thyroid Function Tests: No results for input(s): TSH, T4TOTAL, FREET4, T3FREE, THYROIDAB in the last 72 hours. Anemia Panel: No results for input(s): VITAMINB12, FOLATE, FERRITIN, TIBC, IRON, RETICCTPCT in the last 72 hours. Sepsis Labs: No results for input(s): PROCALCITON, LATICACIDVEN in the last 168 hours.  Recent Results (from the past 240 hour(s))  MRSA PCR Screening     Status: None   Collection Time: 08/28/19  7:54 AM   Specimen: Nasal Mucosa;  Nasopharyngeal  Result Value Ref Range Status   MRSA by PCR NEGATIVE NEGATIVE Final    Comment:        The GeneXpert MRSA Assay (FDA approved for NASAL specimens only), is one component of a comprehensive MRSA colonization surveillance program. It is not intended to diagnose MRSA infection nor to guide or monitor treatment for MRSA infections. Performed at [facility], [place] [address], [place], [place] [address]   Culture, blood (routine x 2)     Status: None   Collection Time: 08/29/19  5:48 AM   Specimen: BLOOD  Result Value Ref Range Status   Specimen Description BLOOD RIGHT HAND  Final   Special Requests   Final    BOTTLES DRAWN AEROBIC ONLY Blood Culture adequate volume   Culture   Final    NO GROWTH 5 DAYS Performed at [facility], [place] [address], [place], [place] [address]    Report Status 09/03/2019 FINAL  Final  Culture, blood (routine x 2)     Status: None   Collection Time: 08/29/19  5:51 AM   Specimen: BLOOD  Result Value Ref Range Status   Specimen Description BLOOD LEFT ARM  Final   Special Requests   Final    BOTTLES DRAWN AEROBIC AND ANAEROBIC Blood Culture adequate volume   Culture   Final    NO GROWTH 5 DAYS Performed at [facility], [place] [address] [place], [place] [address]    Report Status 09/03/2019 FINAL  Final  Surgical PCR screen     Status: None   Collection Time: 08/31/19  1:47 PM   Specimen: Nasal Mucosa; Nasal Swab  Result Value Ref Range Status   MRSA, PCR NEGATIVE NEGATIVE Final   Staphylococcus aureus NEGATIVE NEGATIVE Final    Comment: (NOTE) The Xpert SA Assay (FDA approved for NASAL specimens in patients 41 years of age and older), is one component of a comprehensive surveillance program. It is not intended to diagnose infection nor to guide or monitor treatment. Performed at [facility], [place] [address], [place], [place] [address]   Fungus Culture With Stain     Status: None (Preliminary result)    Collection Time: 08/31/19  3:24 PM   Specimen: Other Source; Body Fluid  Result Value Ref Range Status   Fungus Stain Final report  Final    Comment: (NOTE) Performed At: [facility] [place], [place] [id number] Rush Farmer MD [id number]    Fungus (Mycology) Culture PENDING  Incomplete   Fungal Source JOINT FLUID  Final    Comment: STERNOCLAVICULAR Performed at [facility], [place] [address] [place], [place] [address]   Aerobic/Anaerobic Culture (surgical/deep wound)     Status: None   Collection Time: 08/31/19  3:24 PM   Specimen: Other Source; Body Fluid  Result Value Ref Range Status   Specimen Description JOINT FLUID  Final   Special Requests LEFT STERNOCLAVICULAR  Final   Gram Stain   Final  ABUNDANT WBC PRESENT,BOTH PMN AND MONONUCLEAR FEW GRAM POSITIVE COCCI    Culture   Final    FEW STAPHYLOCOCCUS AUREUS NO ANAEROBES ISOLATED Performed at [facility], [address], [place], [place] [address]    Report Status 09/05/2019 FINAL  Final   Organism ID, Bacteria STAPHYLOCOCCUS AUREUS  Final      Susceptibility   Staphylococcus aureus - MIC*    CIPROFLOXACIN <=0.5 SENSITIVE Sensitive     ERYTHROMYCIN <=0.25 SENSITIVE Sensitive     GENTAMICIN <=0.5 SENSITIVE Sensitive     OXACILLIN <=0.25 SENSITIVE Sensitive     TETRACYCLINE <=1 SENSITIVE Sensitive     VANCOMYCIN 1 SENSITIVE Sensitive     TRIMETH/SULFA <=10 SENSITIVE Sensitive     CLINDAMYCIN <=0.25 SENSITIVE Sensitive     RIFAMPIN <=0.5 SENSITIVE Sensitive     Inducible Clindamycin NEGATIVE Sensitive     * FEW STAPHYLOCOCCUS AUREUS  Acid Fast Smear (AFB)     Status: None   Collection Time: 08/31/19  3:24 PM   Specimen: Other Source; Body Fluid  Result Value Ref Range Status   AFB Specimen Processing Concentration  Final   Acid Fast Smear Negative  Final    Comment: (NOTE) Performed At: [facility] [place], [place] [id number] Rush Farmer MD  [id number]    Source (AFB) JOINT FLUID  Final    Comment: STERNOCLAVICULAR Performed at [facility], [place] [address] [place], [place] [address]   Fungus Culture Result     Status: None   Collection Time: 08/31/19  3:24 PM  Result Value Ref Range Status   Result 1 Comment  Final    Comment: (NOTE) KOH/Calcofluor preparation:  no fungus observed. Performed At: [facility] [place], [place] [id number] Rush Farmer MD [id number]          Radiology Studies: No results found.      Scheduled Meds: . atorvastatin  80 mg Oral QHS  . brimonidine  1 drop Both Eyes TID  . dabigatran  150 mg Oral Q12H  . dofetilide  500 mcg Oral BID  . fluticasone furoate-vilanterol  1 puff Inhalation Daily  . insulin aspart  0-15 Units Subcutaneous TID WC  . insulin aspart  0-5 Units Subcutaneous QHS  . ipratropium-albuterol  3 mL Nebulization BID  . loratadine  10 mg Oral Daily  . pantoprazole  40 mg Oral Daily  . polyethylene glycol  17 g Oral BID  . potassium chloride  40 mEq Oral Daily  . potassium chloride  40 mEq Oral Once  . senna-docusate  1 tablet Oral BID  . sodium chloride flush  10-40 mL Intracatheter Q12H   Continuous Infusions: . [START ON 09/16/2019]  ceFAZolin (ANCEF) IV    . nafcillin IV 2 g (09/06/19 1240)     LOS: 10 days        Alma Friendly, MD [facility] 09/06/2019, 2:34 PM

## 2019-09-07 LAB — BASIC METABOLIC PANEL
Anion gap: 10 (ref 5–15)
BUN: 10 mg/dL (ref 8–23)
CO2: 24 mmol/L (ref 22–32)
Calcium: 9.1 mg/dL (ref 8.9–10.3)
Chloride: 100 mmol/L (ref 98–111)
Creatinine, Ser: 0.8 mg/dL (ref 0.61–1.24)
GFR calc Af Amer: >60 mL/min (ref >60)
GFR calc non Af Amer: >60 mL/min (ref >60)
Glucose, Bld: 115 mg/dL — ABNORMAL HIGH (ref 70–99)
Potassium: 4.4 mmol/L (ref 3.5–5.1)
Sodium: 134 mmol/L — ABNORMAL LOW (ref 135–145)

## 2019-09-07 LAB — GLUCOSE, CAPILLARY
Glucose-Capillary: 129 mg/dL — ABNORMAL HIGH (ref 70–99)
Glucose-Capillary: 132 mg/dL — ABNORMAL HIGH (ref 70–99)
Glucose-Capillary: 123 mg/dL — ABNORMAL HIGH (ref 70–99)
Glucose-Capillary: 126 mg/dL — ABNORMAL HIGH (ref 70–99)
Glucose-Capillary: 185 mg/dL — ABNORMAL HIGH (ref 70–99)

## 2019-09-07 LAB — MAGNESIUM: Magnesium: 1.8 mg/dL (ref 1.7–2.4)

## 2019-09-07 NOTE — Progress Notes (Addendum)
      [address]       [place] [address]             [phone]      7 Days Post-Op Procedure(s) (LRB): LEFT STERNOCLAVICULAR WOUND DEBRIDEMENT WITH APPLICATION OF WOUND VAC (Left)   Subjective:  Patient is doing better.  He states that he was able to get up on the side of the bed yesterday.  He states the muscle spasms are what really cause him discomfort.  He states the flexeril really helps more than the pain medication when they are occurring.  Objective: Vital signs in last 24 hours: Temp:  [97.7 F (36.5 C)-98.1 F (36.7 C)] 97.7 F (36.5 C) (09/08 0714) Pulse Rate:  [69-76] 70 (09/08 0714) Cardiac Rhythm: Normal sinus rhythm (09/07 1945) Resp:  [16-19] 16 (09/08 0714) BP: (129-139)/(66-76) 129/66 (09/08 0714) SpO2:  [94 %-96 %] 96 % (09/08 0724)  Intake/Output from previous day: 09/07 0701 - 09/08 0700 In: 960 [P.O.:360; IV Piggyback:600] Out: 600 [Urine:600] Intake/Output this shift: Total I/O In: -  Out: 1200 [Urine:1200]  General appearance: alert, cooperative and no distress Heart: regular rate and rhythm Lungs: clear to auscultation bilaterally Wound: wound vac in place, no surrounding erythema, or edema present  Lab Results: No results for input(s): WBC, HGB, HCT, PLT in the last 72 hours. BMET:  Recent Labs    09/06/19 0340 09/07/19 0350  NA 138 134*  K 3.6 4.4  CL 101 100  CO2 27 24  GLUCOSE 132* 115*  BUN 13 10  CREATININE 0.91 0.80  CALCIUM 9.0 9.1    PT/INR: No results for input(s): LABPROT, INR in the last 72 hours. ABG    Component Value Date/Time   TCO2 26 03/01/2017 0258   CBG (last 3)  Recent Labs    09/06/19 1643 09/06/19 2116 09/07/19 0815  GLUCAP 95 124* 132*    Assessment/Plan: S/P Procedure(s) (LRB): LEFT STERNOCLAVICULAR WOUND DEBRIDEMENT WITH APPLICATION OF WOUND VAC (Left)  1. S/P I&D [place] [facility]- wound vac changed yesterday, will plan for repeat wound vac change on Wednesday, one of the PA's  will try to be present for vac change on Wednesday 2. ID- remains afebrile, on ABX per primary 3. Pain control- patient getting good relief from Flexeril, he states he would like this more then 3 times per day, back pain persists, possibly benefit from evaluation by his Neurosurgery 4. Dispo- patient stable, plan for wound vac change Wednesday, care per primary   LOS: 11 days    Ellwood Handler 09/07/2019   Chart reviewed, patient examined, agree with above. He feels ok Wound vac drainage is thin, serosanguinous. No sign of infection around the wound site. Still has some back pain. Unclear if this is degenerative or discitis. Continue antibiotic. Plan VAC change in the AM.

## 2019-09-07 NOTE — Progress Notes (Signed)
RT NOTE:  NIF >40 VC 3.0L  Good effort

## 2019-09-07 NOTE — Progress Notes (Signed)
Patient ID: [name] Monaco., male   DOB: [birth year], 68 y.o.   MRN: [id number]  PROGRESS NOTE    [name] Chimes.  [id number] DOB: December 09, 1951 DOA: 08/27/2019 PCP: Burnis Medin, MD   Brief Narrative:  68 year old male with history of OSA on CPAP, perforated appendicitis in 2015, CAD status post stents, hyperlipidemia, diabetes mellitus type 2, optic neuritis with recent hospitalization at [facility] treated with intravenous and subsequent oral steroids, recent right lower extremity cellulitis treated as an outpatient with oral doxycycline, A. fib on Pradaxa presented with back pain and progressively worsening difficulty walking.  Neurology was consulted.  He was found to have MSSA bacteremia with question of L4-L5 discitis/osteomyelitis.  He was started on Ancef.  ID was consulted.  Assessment & Plan: MSSA bacteremia Currently afebrile, with no leukocytosis Source likely L4-L5 discitis/osteomyelitis, septic arthritis/possible osteomyelitis of the sternoclavicular joint space Repeat blood culture from 08/29/2019 has been negative till date CRP 6.1 Echo showed no valvular vegetations Neurosurgery/Dr. Annette Stable was consulted by phone on 08/28/2019 and he reviewed the MRI lumbar spine and recommended no surgical intervention at this time ID on board, recommend changing to nafcillin due to possible CNS involvement, will need 2 weeks, with de-escalation back to cefazolin for a total duration of IV antibiotics for 6 weeks given concern for associated discitis Per ID, plan for Nafcillin x 14 days through 9/16/20210 then change to cefazolin on 9/17 through 10/12/19 Continue IV nafcillin, cefazolin  Infected left sternoclavicular joint s/p I&D/debridement with application of wound VAC by CTS Surgical/deep tissue culture growing stap aureus  Cardiothoracic surgeons on board, appreciate recs Continue IV nafcillin, cefazolin  Left shoulder pain Improving MRI showed no evidence of septic arthritis,  rotator cuff tendinosis, biceps tendinosis with probable interstitial tear of the intra-articular portion, 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  Neurology is not convinced about this for now. CTA of the head and neck showed no evidence of CNS vasculitis Neurology consulted, appreciate further recs  Thrombocytopenia Resolved Unclear etiology, no signs of bleeding/bruising Daily CBC  Paroxysmal A. Fib Rate controlled Continue Tikosyn, check K+/Mg daily Continue Pradaxa  Diabetes mellitus type 2 A1c 6.7 on 07/21/2019 SSI, Accu-Cheks, hypoglycemic protocol  Hyperlipidemia Continue Lipitor  OSA Continue CPAP at bedside  Obesity Lifestyle modification advised  Generalized deconditioning/weakness PT/OT on board    DVT prophylaxis: Pradaxa Code Status: Full Family Communication: None at bedside Disposition Plan: CIR-not at level yet, will be following    Consultants Neurology Orthopedics ID IR Spoke to neurosurgery/Dr. Annette Stable on 08/28/2019 CTS  Procedures:  Echo IMPRESSIONS    1. The left ventricle has normal systolic function with an ejection fraction of 60-65%. The cavity size was normal. There is mildly increased left ventricular wall thickness. Left ventricular diastolic Doppler parameters are consistent with impaired  relaxation. Indeterminate filling pressures The E/e' is 8-15. No evidence of left ventricular regional wall motion abnormalities.  2. The right ventricle has normal systolic function. The cavity was normal.  There is no increase in right ventricular wall thickness.  3. Left atrial size was mildly dilated.  4. The mitral valve is abnormal. Mild thickening of the mitral  valve leaflet. No mitral valve vegetation visualized.  5. The tricuspid valve is grossly normal.  6. No vegetation on the aortic valve.  7. The aortic valve is tricuspid. Mild sclerosis of the aortic valve. Aortic valve regurgitation is trivial by color flow Doppler. No stenosis of the aortic valve.  8. The aorta is normal unless otherwise noted.  SUMMARY   LVEF 60-65%, mild LVH, normal wall motion, grade 1 DD, indeterminate LV filling pressure, normal RV function, mild LAE, aortic valve sclerosis with trivial AI, no valvular vegetations  Antimicrobials: Nafcillin   Subjective: Patient seen and examined at bedside.  Still complains of chronic back pain, encourage patient to try to get out of bed.  Denies any chest pain, shortness of breath, abdominal pain, nausea/vomiting, fever/chills.   Objective: Vitals:   09/06/19 2115 09/06/19 2333 09/07/19 0714 09/07/19 0724  BP: 138/70  129/66   Pulse: 72 76 70   Resp: 16  16   Temp: 98 F (36.7 C)  97.7 F (36.5 C)   TempSrc:   Oral   SpO2: 94% 95% 95% 96%  Weight:      Height:        Intake/Output Summary (Last 24 hours) at 09/07/2019 1418 Last data filed at 09/07/2019 1114 Gross per 24 hour  Intake 720 ml  Output 1700 ml  Net -980 ml   Filed Weights   08/29/19 0500 08/30/19 0416  Weight: 118 kg 117.9 kg    Examination:  General: NAD   Cardiovascular: S1, S2 present  Respiratory: Diminished breath sounds bilaterally, chest wound VAC noted  Abdomen: Soft, nontender, nondistended, bowel sounds present  Musculoskeletal: No bilateral pedal edema noted  Skin: Normal  Psychiatry: Normal mood   Data Reviewed: I have personally reviewed following labs and imaging studies  CBC: Recent Labs  Lab 09/01/19 0252 09/02/19 0324 09/03/19 0408 09/04/19 0343   WBC 12.3* 11.6* 10.0 10.4  NEUTROABS 9.9* 7.3 6.8 7.3  HGB 15.4 15.8 15.1 14.5  HCT 46.0 46.1 46.2 43.4  MCV 90.6 90.0 93.3 91.8  PLT 101* 122* 147* 0000000   Basic Metabolic Panel: Recent Labs  Lab 09/03/19 0408 09/04/19 0343 09/05/19 0316 09/06/19 0340 09/07/19 0350  NA 138 138 138 138 134*  K 3.6 3.4* 3.8 3.6 4.4  CL 101 102 103 101 100  CO2 28 27 30 27 24   GLUCOSE 165* 157* 141* 132* 115*  BUN 22 17 15 13 10   CREATININE 1.01 0.89 0.87 0.91 0.80  CALCIUM 9.1 9.0 8.9 9.0 9.1  MG 1.9 1.8 1.8 1.8 1.8   GFR: Estimated Creatinine Clearance: 117.2 mL/min (by C-G formula based on SCr of 0.8 mg/dL). Liver Function Tests: No results for input(s): AST, ALT, ALKPHOS, BILITOT, PROT, ALBUMIN in the last 168 hours. No results for input(s): LIPASE, AMYLASE in the last 168 hours. No results for input(s): AMMONIA in the last 168 hours. Coagulation Profile: No results for input(s): INR, PROTIME in the last 168 hours. Cardiac Enzymes: No results for input(s): CKTOTAL, CKMB, CKMBINDEX, TROPONINI in the last 168 hours. BNP (last 3 results) No results for input(s): PROBNP in the last 8760 hours. HbA1C: No results for input(s): HGBA1C in the last 72 hours. CBG: Recent Labs  Lab 09/06/19 1151 09/06/19 1643 09/06/19 2116 09/07/19 0815 09/07/19 1209  GLUCAP 168* 95 124* 132* 123*   Lipid Profile: No results for input(s): CHOL, HDL, LDLCALC, TRIG, CHOLHDL, LDLDIRECT in the last 72 hours. Thyroid Function Tests: No results for input(s): TSH, T4TOTAL, FREET4,  T3FREE, THYROIDAB in the last 72 hours. Anemia Panel: No results for input(s): VITAMINB12, FOLATE, FERRITIN, TIBC, IRON, RETICCTPCT in the last 72 hours. Sepsis Labs: No results for input(s): PROCALCITON, LATICACIDVEN in the last 168 hours.  Recent Results (from the past 240 hour(s))  Culture, blood (routine x 2)     Status: None   Collection Time: 08/29/19  5:48 AM   Specimen: BLOOD  Result Value Ref Range Status   Specimen  Description BLOOD RIGHT HAND  Final   Special Requests   Final    BOTTLES DRAWN AEROBIC ONLY Blood Culture adequate volume   Culture   Final    NO GROWTH 5 DAYS Performed at [facility], [address] [place], [place] [address]    Report Status 09/03/2019 FINAL  Final  Culture, blood (routine x 2)     Status: None   Collection Time: 08/29/19  5:51 AM   Specimen: BLOOD  Result Value Ref Range Status   Specimen Description BLOOD LEFT ARM  Final   Special Requests   Final    BOTTLES DRAWN AEROBIC AND ANAEROBIC Blood Culture adequate volume   Culture   Final    NO GROWTH 5 DAYS Performed at [facility], [place] [address] [place], [place] [address]    Report Status 09/03/2019 FINAL  Final  Surgical PCR screen     Status: None   Collection Time: 08/31/19  1:47 PM   Specimen: Nasal Mucosa; Nasal Swab  Result Value Ref Range Status   MRSA, PCR NEGATIVE NEGATIVE Final   Staphylococcus aureus NEGATIVE NEGATIVE Final    Comment: (NOTE) The Xpert SA Assay (FDA approved for NASAL specimens in patients 69 years of age and older), is one component of a comprehensive surveillance program. It is not intended to diagnose infection nor to guide or monitor treatment. Performed at [facility], [place] [address] [place], [place] [address]   Fungus Culture With Stain     Status: None (Preliminary result)   Collection Time: 08/31/19  3:24 PM   Specimen: Other Source; Body Fluid  Result Value Ref Range Status   Fungus Stain Final report  Final    Comment: (NOTE) Performed At: [facility] [place], [place] [id number] Rush Farmer MD [id number]    Fungus (Mycology) Culture PENDING  Incomplete   Fungal Source JOINT FLUID  Final    Comment: STERNOCLAVICULAR Performed at [facility], [place] [address] [place], [place] [address]   Aerobic/Anaerobic Culture (surgical/deep wound)     Status: None   Collection Time: 08/31/19  3:24 PM   Specimen:  Other Source; Body Fluid  Result Value Ref Range Status   Specimen Description JOINT FLUID  Final   Special Requests LEFT STERNOCLAVICULAR  Final   Gram Stain   Final    ABUNDANT WBC PRESENT,BOTH PMN AND MONONUCLEAR FEW GRAM POSITIVE COCCI    Culture   Final    FEW STAPHYLOCOCCUS AUREUS NO ANAEROBES ISOLATED Performed at [facility], [place] [address] [place], [place] [address]    Report Status 09/05/2019 FINAL  Final   Organism ID, Bacteria STAPHYLOCOCCUS AUREUS  Final      Susceptibility   Staphylococcus aureus - MIC*    CIPROFLOXACIN <=0.5 SENSITIVE Sensitive     ERYTHROMYCIN <=0.25 SENSITIVE Sensitive     GENTAMICIN <=0.5 SENSITIVE Sensitive     OXACILLIN <=0.25 SENSITIVE Sensitive     TETRACYCLINE <=1 SENSITIVE Sensitive  VANCOMYCIN 1 SENSITIVE Sensitive     TRIMETH/SULFA <=10 SENSITIVE Sensitive     CLINDAMYCIN <=0.25 SENSITIVE Sensitive     RIFAMPIN <=0.5 SENSITIVE Sensitive     Inducible Clindamycin NEGATIVE Sensitive     * FEW STAPHYLOCOCCUS AUREUS  Acid Fast Smear (AFB)     Status: None   Collection Time: 08/31/19  3:24 PM   Specimen: Other Source; Body Fluid  Result Value Ref Range Status   AFB Specimen Processing Concentration  Final   Acid Fast Smear Negative  Final    Comment: (NOTE) Performed At: [facility] [place], [place] [id number] Rush Farmer MD [id number]    Source (AFB) JOINT FLUID  Final    Comment: STERNOCLAVICULAR Performed at [facility], [place] [address], [place], [place] [address]   Fungus Culture Result     Status: None   Collection Time: 08/31/19  3:24 PM  Result Value Ref Range Status   Result 1 Comment  Final    Comment: (NOTE) KOH/Calcofluor preparation:  no fungus observed. Performed At: [facility] [place], [place] [id number] Rush Farmer MD [id number]          Radiology Studies: No results found.      Scheduled Meds: . atorvastatin  80 mg Oral  QHS  . brimonidine  1 drop Both Eyes TID  . dabigatran  150 mg Oral Q12H  . dofetilide  500 mcg Oral BID  . fluticasone furoate-vilanterol  1 puff Inhalation Daily  . insulin aspart  0-15 Units Subcutaneous TID WC  . insulin aspart  0-5 Units Subcutaneous QHS  . ipratropium-albuterol  3 mL Nebulization BID  . loratadine  10 mg Oral Daily  . pantoprazole  40 mg Oral Daily  . polyethylene glycol  17 g Oral BID  . potassium chloride  40 mEq Oral Daily  . senna-docusate  1 tablet Oral BID  . sodium chloride flush  10-40 mL Intracatheter Q12H   Continuous Infusions: . [START ON 09/16/2019]  ceFAZolin (ANCEF) IV    . nafcillin IV 2 g (09/07/19 1259)     LOS: 11 days        Alma Friendly, MD [facility] 09/07/2019, 2:18 PM

## 2019-09-07 NOTE — Progress Notes (Signed)
NIF greater than -40 VC 2.5L  Each done with great effort.

## 2019-09-07 NOTE — Progress Notes (Signed)
Occupational Therapy Treatment Patient Details Name: [name] [name]. MRN: [id number] DOB: [birth year] Today's Date: 09/07/2019    History of present illness [name] [name]. is a 68 y.o. male with medical history significant of OSA on CPAP; perforated appy (2015); obesity; CAD s/p stents; HLD; DM; optic nerve neuritis; and afib on Pradaxa presenting with back pain.   OT comments  Pt progressing well toward stated goals, eager to work with OT this date. Pt cont to be ltd 2/2 pain with transfers but cont to be willing to try. Heavy max A for sidelying to sit this date, able to sit ~5 minutes before needs to lay back down due to spasm pains. Pt completing rolling with less pain on R vs L at mod A. Pt getting early signs of pressure ulcers on buttocks, educated extensively on importance of pressure relief. Facilitated pt in in R sidelying position for relief, educated on frequency of turning and becoming ind in directing care. Pt becoming tearful about situation, improves with encouragement. Continue to recommend pt for CIR level of therapies at d/c to facilitate previous PLOF. Will continue to follow acutely.    Follow Up Recommendations  CIR;Supervision/Assistance - 24 hour    Equipment Recommendations  None recommended by OT    Recommendations for Other Services      Precautions / Restrictions Precautions Precautions: Fall Precaution Comments: wound vac L  joint Restrictions Weight Bearing Restrictions: No Other Position/Activity Restrictions: wound vac intact       Mobility Bed Mobility Overal bed mobility: Needs Assistance Bed Mobility: Sidelying to Sit;Sit to Sidelying;Rolling Rolling: Mod assist Sidelying to sit: Max assist;+2 for safety/equipment     Sit to sidelying: Max assist;+2 for safety/equipment General bed mobility comments: pt rolling well, preferring to for pressure relief, R less pain than L. Max A +2 for safety (or heavymax of 1) for BLE translation to EOB  and assist at trunk. Pt prefers to go slow given pain so second set of hands is  more beneficial  Transfers                 General transfer comment: deferred 2/2 pain    Balance Overall balance assessment: Needs assistance Sitting-balance support: Feet supported;Bilateral upper extremity supported Sitting balance-Leahy Scale: Poor Sitting balance - Comments: support to maintain balance ~5 minutes this date given pain, strong BUE support to offload pain                                   ADL either performed or assessed with clinical judgement   ADL Overall ADL's : Needs assistance/impaired     Grooming: Set up;Bed level Grooming Details (indicate cue type and reason): attmpeted EOB, pt not able to maintaing sitting this date given pain                               General ADL Comments: pt cont to be ltd 2/2 back painwith BADL engagement     Vision Baseline Vision/History: Wears glasses Wears Glasses: At all times Patient Visual Report: No change from baseline     Perception     Praxis      Cognition Arousal/Alertness: Awake/alert Behavior During Therapy: WFL for tasks assessed/performed Overall Cognitive Status: Within Functional Limits for tasks assessed  General Comments: pt tearful and discouraged during session, 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 and feeling sad that he is in this situation and wife is at home and cannot visit        Exercises     Shoulder Instructions       General Comments      Pertinent Vitals/ Pain       Pain Assessment: Faces Faces Pain Scale: Hurts whole lot Pain Location: lower back and R shoulder above PICC Pain Descriptors / Indicators: Discomfort;Grimacing;Guarding;Moaning Pain Intervention(s): Limited activity within patient's tolerance;Monitored during session;Repositioned  Home Living                                           Prior Functioning/Environment              Frequency  Min 2X/week        Progress Toward Goals  OT Goals(current goals can now be found in the care plan section)  Progress towards OT goals: Progressing toward goals  Acute Rehab OT Goals Patient Stated Goal: to decrease pain OT Goal Formulation: With patient/family Time For Goal Achievement: 09/15/19 Potential to Achieve Goals: Good  Plan Discharge plan remains appropriate;Frequency remains appropriate    Co-evaluation                 AM-PAC OT "6 Clicks" Daily Activity     Outcome Measure   Help from another person eating meals?: None Help from another person taking care of personal grooming?: A Little Help from another person toileting, which includes using toliet, bedpan, or urinal?: Total Help from another person bathing (including washing, rinsing, drying)?: A Lot Help from another person to put on and taking off regular upper body clothing?: A Lot Help from another person to put on and taking off regular lower body clothing?: Total 6 Click Score: 13    End of Session Equipment Utilized During Treatment: Oxygen  OT Visit Diagnosis: Other abnormalities of gait and mobility (R26.89);Muscle weakness (generalized) (M62.81);Pain Pain - part of body: (back)   Activity Tolerance Patient tolerated treatment well;Patient limited by pain   Patient Left in bed;with call bell/phone within reach;with bed alarm set   Nurse Communication Mobility status        Time: 1105-1140 OT Time Calculation (min): 35 min  Charges: OT General Charges $OT Visit: 1 Visit OT Treatments $Self Care/Home Management : 8-22 mins $Therapeutic Activity: 8-22 mins  [name] [name], MSOT, OTR/L Behavioral Health OT/ Acute Relief OT [facility] Office: [place] 09/07/2019, 1:38 PM

## 2019-09-07 NOTE — Progress Notes (Signed)
ANTICOAGULATION CONSULT NOTE - follow up Pharmacy Consult for dabigatran Indication: atrial fibrillation  Allergies  Allergen Reactions  . Novocain [Procaine] Hives    Dentist appointment in 1968; since then has tolerated lidocaine and provocaine with no hives or difficulty.  . Pseudoephedrine Other (See Comments)    Patient went into afib  . Quinolones     Patient was warned about not using Cipro and similar antibiotics. Recent studies have raised concern that fluoroquinolone antibiotics could be associated with an increased risk of aortic aneurysm Fluoroquinolones have non-antimicrobial properties that might jeopardise the integrity of the extracellular matrix of the vascular wall In a  propensity score matched cohort study in [place], there was a 66% increased rate of aortic aneurysm or dissection associated with oral fluoroquinolone use, compared wit  . Sulfonamide Derivatives     Childhood reaction   . Cardizem [Diltiazem] Rash    Also 2019  . Pseudoephedrine Hcl Palpitations    Patient Measurements: Height: 6\' 1"  (185.4 cm) Weight: 259 lb 14.8 oz (117.9 kg) IBW/kg (Calculated) : 79.9   Vital Signs: Temp: 97.7 F (36.5 C) (09/08 0714) Temp Source: Oral (09/08 0714) BP: 129/66 (09/08 0714) Pulse Rate: 70 (09/08 0714)  Labs: Recent Labs    09/05/19 0316 09/06/19 0340 09/07/19 0350  CREATININE 0.87 0.91 0.80    Estimated Creatinine Clearance: 117.2 mL/min (by C-G formula based on SCr of 0.8 mg/dL).   Medical History: Past Medical History:  Diagnosis Date  . Atrial fibrillation ([place]) 03/22/2009   a. s/p multiple DCCV; b. no coumadin due to low TE risk profile; c. Tikosyn Rx  . Coronary atherosclerosis of native coronary artery 11/2002   a. s/p stent to LAD 12/03; OM2 occluded at cath 12/03; d. myoview 5/10: no ischemia;  e. echo 7/11: EF 55%, BAE, mild RVE, PASP 41-45; Myoview was in March 2013. There was no ischemia or infarction, EF 51%   . Cutaneous abscess of  back excluding buttocks 07/04/2014   Appears to stem from possibly a cyst very large area 6 cm contact surgeon office   . Diabetes mellitus without complication ([place])   . Drusen body    see opth note  . ERECTILE DYSFUNCTION 03/22/2009  . GERD 03/22/2009  . HYPERGLYCEMIA 04/25/2010  . HYPERLIPIDEMIA 03/22/2009  . 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 LATERAL EPICONDYLITIS, LEFT 10/24/2009  . LIVER FUNCTION TESTS, ABNORMAL 04/25/2010  . Local reaction to immunization 05/05/2012   minor resolving  zostavax   . Myocardial infarction ([place]) mi2003  . Numbness in left leg    foot related to back disease and surgery  . Obesity, unspecified 04/24/2009  . Perforated appendicitis with necrosis s/p open appendectomy 06/07/14 06/04/2014  . Renal cyst    Characterized by MRI as simple  . Ruptured suppurative appendicitis    2015   . SLEEP APNEA, OBSTRUCTIVE 03/22/2009   compliant with CPAP  . THROMBOCYTOPENIA 08/16/2010  . TOBACCO USE, QUIT 10/24/2009  . 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 Umbilical hernia    Assessment: 68 yo male with afib on dabigatran PTA. Patient post-op LEFT STERNOCLAVICULAR WOUND DEBRIDEMENT on 08/31/19.  Pharmacy consulted on 9/2 to restart dabigatran for anticoagulation.  CBC on 09/04/19 within normal limits/stable. No bleeding noted.  Renal function : SCr wnl   Goal of Therapy:   Monitor platelets by anticoagulation protocol: Yes   Plan:  Continue dabigatran 150mg  PO BID as per home dose.  Monitor for  bleeding  Nicole Cella, RPh Clinical Pharmacist [place] Please utilize Amion for appropriate phone number to reach the unit pharmacist ([place]) 09/07/2019,1:05 PM

## 2019-09-08 DIAGNOSIS — M009 Pyogenic arthritis, unspecified: Secondary | ICD-10-CM

## 2019-09-08 LAB — BASIC METABOLIC PANEL
Anion gap: 11 (ref 5–15)
BUN: 15 mg/dL (ref 8–23)
CO2: 26 mmol/L (ref 22–32)
Calcium: 9.4 mg/dL (ref 8.9–10.3)
Chloride: 97 mmol/L — ABNORMAL LOW (ref 98–111)
Creatinine, Ser: 0.93 mg/dL (ref 0.61–1.24)
GFR calc Af Amer: >60 mL/min (ref >60)
GFR calc non Af Amer: >60 mL/min (ref >60)
Glucose, Bld: 149 mg/dL — ABNORMAL HIGH (ref 70–99)
Potassium: 3.9 mmol/L (ref 3.5–5.1)
Sodium: 134 mmol/L — ABNORMAL LOW (ref 135–145)

## 2019-09-08 LAB — CBC WITH DIFFERENTIAL/PLATELET
Abs Immature Granulocytes: 0.05 10*3/uL (ref 0.00–0.07)
Basophils Absolute: 0 10*3/uL (ref 0.0–0.1)
Basophils Relative: 0 %
Eosinophils Absolute: 0.2 10*3/uL (ref 0.0–0.5)
Eosinophils Relative: 2 %
HCT: 44.8 % (ref 39.0–52.0)
Hemoglobin: 15.1 g/dL (ref 13.0–17.0)
Immature Granulocytes: 1 %
Lymphocytes Relative: 15 %
Lymphs Abs: 1.6 10*3/uL (ref 0.7–4.0)
MCH: 30.7 pg (ref 26.0–34.0)
MCHC: 33.7 g/dL (ref 30.0–36.0)
MCV: 91.1 fL (ref 80.0–100.0)
Monocytes Absolute: 0.9 10*3/uL (ref 0.1–1.0)
Monocytes Relative: 8 %
Neutro Abs: 8.3 10*3/uL — ABNORMAL HIGH (ref 1.7–7.7)
Neutrophils Relative %: 74 %
Platelets: 194 10*3/uL (ref 150–400)
RBC: 4.92 MIL/uL (ref 4.22–5.81)
RDW: 13.7 % (ref 11.5–15.5)
WBC: 11.1 10*3/uL — ABNORMAL HIGH (ref 4.0–10.5)
nRBC: 0 % (ref 0.0–0.2)

## 2019-09-08 LAB — GLUCOSE, CAPILLARY
Glucose-Capillary: 142 mg/dL — ABNORMAL HIGH (ref 70–99)
Glucose-Capillary: 127 mg/dL — ABNORMAL HIGH (ref 70–99)
Glucose-Capillary: 128 mg/dL — ABNORMAL HIGH (ref 70–99)
Glucose-Capillary: 162 mg/dL — ABNORMAL HIGH (ref 70–99)

## 2019-09-08 LAB — MAGNESIUM: Magnesium: 1.8 mg/dL (ref 1.7–2.4)

## 2019-09-08 NOTE — Progress Notes (Signed)
Patient ID: [name] [name]., male   DOB: [birth year], 68 y.o.   MRN: [id number]  PROGRESS NOTE    [name].  [id number] DOB: Oct 08, 1951 DOA: 08/27/2019 PCP: Burnis Medin, MD   Brief Narrative:  68 year old male with history of OSA on CPAP, perforated appendicitis in 2015, CAD status post stents, hyperlipidemia, diabetes mellitus type 2, optic neuritis with recent hospitalization at [facility] treated with intravenous and subsequent oral steroids, recent right lower extremity cellulitis treated as an outpatient with oral doxycycline, A. fib on Pradaxa presented with back pain and progressively worsening difficulty walking.  Neurology was consulted.  He was found to have MSSA bacteremia with question of L4-L5 discitis/osteomyelitis.  He was started on Ancef.  ID was consulted.  Subsequently, he was found to have infected left sternoclavicular joint.  CT surgery was consulted.  He underwent I&D with application of wound VAC by CT surgery.  ID recommends nafcillin for 14 days till 09/15/2019 then changed to cefazolin on 09/16/2019 through 10/12/2019.  Assessment & Plan:  MSSA bacteremia Question of L4-L5 discitis/osteomyelitis -Repeat blood cultures from 08/29/2019 have been negative so far. Echo showed no valvular vegetations.  Spoke to neurosurgery/Dr. Annette Stable on phone on 08/28/2019 and he reviewed the MRI lumbar spine and recommended no surgical intervention at this time. -ID recommended nafcillin for 14 days till 09/15/2019 then changed to cefazolin on 09/16/2019 through 10/12/2019.  Infected left sternoclavicular joint status post I&D with application of wound VAC by CT surgery -Surgical/deep tissue culture grew staph aureus -CT surgery following.  Wound care as per CT surgery recommendations.  Antibiotic plan as above.  Left shoulder pain -Improving. 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   -MRI of the cervical and thoracic spine showed degenerative changes without cord signal abnormality -MRI of the brain showed question of punctate acute infarctions.    Neurology was not convinced about this for now.  CTA of the head and neck showed no evidence of CNS vasculitis -PT/OT recommends CIR.  CIR following. -Neurology has signed off.  Leukocytosis -Stable.  Thrombocytopenia -Questionable cause.    Resolved.  Dehydration Acute kidney injury -Resolved.  Off IV fluids.  Paroxysmal A. fib on Pradaxa -Continue Tikosyn.  Check magnesium daily. -Continue Pradaxa.  Diabetes mellitus type 2 -A1c 6.7 on 07/21/2019 -Glucophage held -Continue CBGs with SSI  Hyperlipidemia -Continue Lipitor  OSA -Continue CPAP at bedside  Obesity -Outpatient follow-up  Generalized deconditioning/weakness-might need CIR placement.  Patient is still very deconditioned and hardly is able to get up and walk.   DVT prophylaxis:  Pradaxa Code Status: Full Family Communication:  None at bedside  disposition Plan: Might need CIR placement  Consultants: Neurology/ID/IR/CT surgery/orthopedics.  Spoke to neurosurgery/Dr. Annette Stable on 08/28/2019.  Procedures:  Echo IMPRESSIONS   1. The left ventricle has normal systolic function with an ejection fraction of 60-65%. The cavity size was normal. There is mildly increased left ventricular wall thickness. Left ventricular diastolic Doppler parameters are consistent with impaired  relaxation. Indeterminate filling pressures The E/e' is 8-15. No evidence of left ventricular regional wall motion  abnormalities. 2. The right ventricle has normal systolic function. The cavity was normal. There is  no increase in right ventricular wall thickness. 3. Left atrial size was mildly dilated. 4. The mitral valve is abnormal. Mild thickening of the mitral valve leaflet. No mitral valve vegetation visualized. 5. The tricuspid valve is grossly normal. 6. No vegetation on the aortic valve. 7. The aortic valve is tricuspid. Mild sclerosis of the aortic valve. Aortic valve regurgitation is trivial by color flow Doppler. No stenosis of the aortic valve. 8. The aorta is normal unless otherwise noted.  SUMMARY  LVEF 60-65%, mild LVH, normal wall motion, grade 1 DD, indeterminate LV filling pressure, normal RV function, mild LAE, aortic valve sclerosis with trivial AI, no valvular vegetations  Antimicrobials:  Anti-infectives (From admission, onward)   Start     Dose/Rate Route Frequency Ordered Stop   09/16/19 0600  ceFAZolin (ANCEF) IVPB 2g/100 mL premix     2 g 200 mL/hr over 30 Minutes Intravenous Every 8 hours 09/02/19 1102     08/31/19 2000  nafcillin 2 g in sodium chloride 0.9 % 100 mL IVPB     2 g 200 mL/hr over 30 Minutes Intravenous Every 4 hours 08/31/19 1344 09/15/19 2359   08/31/19 1300  amoxicillin (AMOXIL) capsule 500 mg     500 mg Oral  Once 08/31/19 1117 08/31/19 1227   08/31/19 1258  ceFAZolin (ANCEF) IVPB 2g/100 mL premix  Status:  Discontinued     2 g 200 mL/hr over 30 Minutes Intravenous 30 min pre-op 08/31/19 1258 08/31/19 1713   08/28/19 0300  ceFAZolin (ANCEF) IVPB 2g/100 mL premix  Status:  Discontinued     2 g 200 mL/hr over 30 Minutes Intravenous Every 8 hours 08/28/19 0225 08/31/19 1117        Subjective: Patient seen and examined at bedside.  He still feels very weak.  Denies any overnight fever, worsening lower back pain.  Nausea, vomiting or diarrhea.  Objective: Vitals:   09/07/19 1446 09/07/19 2039 09/07/19 2049 09/08/19 0531  BP: 126/64   127/70 120/61  Pulse: 79 72 81 72  Resp: 16  19 14   Temp: 98.1 F (36.7 C)  97.9 F (36.6 C) 97.7 F (36.5 C)  TempSrc: Oral  Oral Oral  SpO2: 97% 98% 98% 95%  Weight:      Height:        Intake/Output Summary (Last 24 hours) at 09/08/2019 1130 Last data filed at 09/08/2019 0952 Gross per 24 hour  Intake 2071.44 ml  Output 2370 ml  Net -298.56 ml   Filed Weights   08/29/19 0500 08/30/19 0416  Weight: 118 kg 117.9 kg    Examination:  General exam: Appears calm and comfortable  Respiratory system: Bilateral decreased breath sounds at bases.  No wheezing Cardiovascular system: Rate controlled, S1-S2 heard Gastrointestinal system: Abdomen is nondistended, soft and nontender. Normal bowel sounds heard. Extremities: No cyanosis, clubbing; trace edema   Data Reviewed: I have personally reviewed following labs and imaging studies  CBC: Recent Labs  Lab 09/02/19 0324 09/03/19 0408 09/04/19 0343 09/08/19 0447  WBC 11.6* 10.0 10.4 11.1*  NEUTROABS 7.3 6.8 7.3 8.3*  HGB 15.8 15.1 14.5 15.1  HCT 46.1 46.2 43.4 44.8  MCV 90.0 93.3 91.8 91.1  PLT 122* 147* 153 [id number]   Basic Metabolic Panel: Recent Labs  Lab 09/04/19 0343 09/05/19 0316 09/06/19 0340 09/07/19 0350 09/08/19 0447  NA 138 138 138 134* 134*  K 3.4* 3.8 3.6 4.4 3.9  CL 102 103 101 100 97*  CO2 27 30 27 24 26   GLUCOSE  157* 141* 132* 115* 149*  BUN 17 15 13 10 15   CREATININE 0.89 0.87 0.91 0.80 0.93  CALCIUM 9.0 8.9 9.0 9.1 9.4  MG 1.8 1.8 1.8 1.8 1.8   GFR: Estimated Creatinine Clearance: 100.8 mL/min (by C-G formula based on SCr of 0.93 mg/dL). Liver Function Tests: No results for input(s): AST, ALT, ALKPHOS, BILITOT, PROT, ALBUMIN in the last 168 hours. No results for input(s): LIPASE, AMYLASE in the last 168 hours. No results for input(s): AMMONIA in the last 168 hours. Coagulation Profile: No results for input(s): INR, PROTIME in the last 168 hours. Cardiac Enzymes: No results for input(s):  CKTOTAL, CKMB, CKMBINDEX, TROPONINI in the last 168 hours. BNP (last 3 results) No results for input(s): PROBNP in the last 8760 hours. HbA1C: No results for input(s): HGBA1C in the last 72 hours. CBG: Recent Labs  Lab 09/07/19 0815 09/07/19 1209 09/07/19 1647 09/07/19 2106 09/08/19 0757  GLUCAP 132* 123* 126* 185* 142*   Lipid Profile: No results for input(s): CHOL, HDL, LDLCALC, TRIG, CHOLHDL, LDLDIRECT in the last 72 hours. Thyroid Function Tests: No results for input(s): TSH, T4TOTAL, FREET4, T3FREE, THYROIDAB in the last 72 hours. Anemia Panel: No results for input(s): VITAMINB12, FOLATE, FERRITIN, TIBC, IRON, RETICCTPCT in the last 72 hours. Sepsis Labs: No results for input(s): PROCALCITON, LATICACIDVEN in the last 168 hours.  Recent Results (from the past 240 hour(s))  Surgical PCR screen     Status: None   Collection Time: 08/31/19  1:47 PM   Specimen: Nasal Mucosa; Nasal Swab  Result Value Ref Range Status   MRSA, PCR NEGATIVE NEGATIVE Final   Staphylococcus aureus NEGATIVE NEGATIVE Final    Comment: (NOTE) The Xpert SA Assay (FDA approved for NASAL specimens in patients 63 years of age and older), is one component of a comprehensive surveillance program. It is not intended to diagnose infection nor to guide or monitor treatment. Performed at [facility], [place] [address] [place], [place] [address]   Fungus Culture With Stain     Status: None (Preliminary result)   Collection Time: 08/31/19  3:24 PM   Specimen: Other Source; Body Fluid  Result Value Ref Range Status   Fungus Stain Final report  Final    Comment: (NOTE) Performed At: [facility] [place], [place] [id number] Rush Farmer MD [id number]    Fungus (Mycology) Culture PENDING  Incomplete   Fungal Source JOINT FLUID  Final    Comment: STERNOCLAVICULAR Performed at [facility], [place] [address] [place], [place] [address]   Aerobic/Anaerobic Culture  (surgical/deep wound)     Status: None   Collection Time: 08/31/19  3:24 PM   Specimen: Other Source; Body Fluid  Result Value Ref Range Status   Specimen Description JOINT FLUID  Final   Special Requests LEFT STERNOCLAVICULAR  Final   Gram Stain   Final    ABUNDANT WBC PRESENT,BOTH PMN AND MONONUCLEAR FEW GRAM POSITIVE COCCI    Culture   Final    FEW STAPHYLOCOCCUS AUREUS NO ANAEROBES ISOLATED Performed at [facility], [place] [address], [place], [place] [address]    Report Status 09/05/2019 FINAL  Final   Organism ID, Bacteria STAPHYLOCOCCUS AUREUS  Final      Susceptibility   Staphylococcus aureus - MIC*    CIPROFLOXACIN <=0.5 SENSITIVE Sensitive     ERYTHROMYCIN <=0.25 SENSITIVE Sensitive     GENTAMICIN <=0.5 SENSITIVE Sensitive     OXACILLIN <=0.25 SENSITIVE Sensitive  TETRACYCLINE <=1 SENSITIVE Sensitive     VANCOMYCIN 1 SENSITIVE Sensitive     TRIMETH/SULFA <=10 SENSITIVE Sensitive     CLINDAMYCIN <=0.25 SENSITIVE Sensitive     RIFAMPIN <=0.5 SENSITIVE Sensitive     Inducible Clindamycin NEGATIVE Sensitive     * FEW STAPHYLOCOCCUS AUREUS  Acid Fast Smear (AFB)     Status: None   Collection Time: 08/31/19  3:24 PM   Specimen: Other Source; Body Fluid  Result Value Ref Range Status   AFB Specimen Processing Concentration  Final   Acid Fast Smear Negative  Final    Comment: (NOTE) Performed At: [facility] [place], [place] [id number] Rush Farmer MD [id number]    Source (AFB) JOINT FLUID  Final    Comment: STERNOCLAVICULAR Performed at [facility], [place] [address], [place], [place] [address]   Fungus Culture Result     Status: None   Collection Time: 08/31/19  3:24 PM  Result Value Ref Range Status   Result 1 Comment  Final    Comment: (NOTE) KOH/Calcofluor preparation:  no fungus observed. Performed At: [facility] [place], [place] [id number] Rush Farmer MD [id number]           Radiology Studies: No results found.      Scheduled Meds: . atorvastatin  80 mg Oral QHS  . brimonidine  1 drop Both Eyes TID  . dabigatran  150 mg Oral Q12H  . dofetilide  500 mcg Oral BID  . fluticasone furoate-vilanterol  1 puff Inhalation Daily  . insulin aspart  0-15 Units Subcutaneous TID WC  . insulin aspart  0-5 Units Subcutaneous QHS  . loratadine  10 mg Oral Daily  . pantoprazole  40 mg Oral Daily  . polyethylene glycol  17 g Oral BID  . potassium chloride  40 mEq Oral Daily  . senna-docusate  1 tablet Oral BID  . sodium chloride flush  10-40 mL Intracatheter Q12H   Continuous Infusions: . [START ON 09/16/2019]  ceFAZolin (ANCEF) IV    . nafcillin IV 2 g (09/08/19 0904)     LOS: 12 days        Aline August, MD [facility] 09/08/2019, 11:30 AM

## 2019-09-08 NOTE — Consult Note (Addendum)
[place] Nurse wound follow-up consult note  VAC dressing change to left chest Wound type: full thickness post-op wound Wound bed: 90% red, 10% yellow; 1X6X.8cm  Drainage (amount, consistency, odor) mod amt pink drainage, no odor Periwound: intact Dressing procedure/placement/frequency: one piece of black foam removed, one piece placed into wound bed. Pt denied need for pain meds and tolerated with minimal amt discomfort.  Immediate seal obtained at 166mm cont suction.  [place] team will plan to change Fri if patient is still in the hospital at that time. Julien Girt MSN, RN, [place], [place], [place]

## 2019-09-08 NOTE — Progress Notes (Addendum)
Inpatient Rehabilitation Admissions Coordinator  I continue to follow his progress. [facility] will not approve an inpt rehab admit until patient has demonstrated the ability on acute hospital to tolerate more intensive therapy.   Danne Baxter, RN, MSN Rehab Admissions Coordinator [phone] 09/08/2019 3:50 PM

## 2019-09-08 NOTE — Progress Notes (Signed)
RT setup patient home CPAP at bedside within reach and added sterile water to waterchamber.

## 2019-09-08 NOTE — Progress Notes (Signed)
Physical Therapy Treatment Patient Details Name: [name] [name]. MRN: [id number] DOB: 08/29/1951 Today's Date: 09/08/2019    History of Present Illness [name] [name]. is a 68 y.o. male with medical history significant of OSA on CPAP; perforated appy (2015); obesity; CAD s/p stents; HLD; DM; optic nerve neuritis; and afib on Pradaxa presenting with back pain.    PT Comments    Pt sat EOB with mod assist - stayed up approx 5-6 minutes.  The pain and fatique of his arms holding weight off his back - caused him to need to lay down. He was not close to being about to do functional movement with his UEs due to this.  Pt wanting to get to chiar - he is getting closer to this.  My concern is that he will not be able to get back to bed quickly if pain unbearable once in chair.  Would abdominal binder help him with comfort when trying to get up?  Pt has spasms in back - I tried massaging them when up - trying to get him to tolerate being up longer.  Pt will be good CIR candidate when medically stable.  I feel pt would benefit from multiple attempts a day to help him get to next level.  This cant happen on acute.  He tries hard - and wants to improve.  He is making progress - just slowly.    Follow Up Recommendations  CIR     Equipment Recommendations  Other (comment)    Recommendations for Other Services       Precautions / Restrictions Precautions Precautions: Fall Precaution Comments: wound vac L [place] joint intact Restrictions Weight Bearing Restrictions: No    Mobility  Bed Mobility Overal bed mobility: Needs Assistance Bed Mobility: Supine to Sit;Sit to Supine Rolling: Mod assist Sidelying to sit: Mod assist;HOB elevated Supine to sit: Mod assist Sit to supine: Mod assist   General bed mobility comments: I let pt lead the roll and sit.  he used rail and HOB elevated. He pulled up on me and I assisted his trunk to come up over his hips.  Pt got up and was initially dizzy but this  was gone quickly.  Pt was not level at first. He had hard time moving his hands (as they were supporting his back) to be able to scoot and get more even.  eventually he was abel to sit up.  Entire process - siting up, staying up and laying back down - done in 10 min span of time.  Pt then helped to scoot up in the bed - needing +2.  Transfers                 General transfer comment: unable due to low back pain and spasms  Ambulation/Gait                 Stairs             Wheelchair Mobility    Modified Rankin (Stroke Patients Only)       Balance       Sitting balance - Comments: support to maintain balance ~5 minutes this date given pain, strong BUE support to offload pain. his arms fatiqued first - requiring him to lay back down.                                    Cognition  Arousal/Alertness: Awake/alert Behavior During Therapy: WFL for tasks assessed/performed Overall Cognitive Status: Within Functional Limits for tasks assessed                                 General Comments: Pt cooperative and works hard. He wants to get up into a chair.      Exercises      General Comments General comments (skin integrity, edema, etc.): When i arrived - pt was eating his lunch at [facility] elevated to 19 degrees.  when i left - he was up 33 degrees and much more comfortable to finish his meal      Pertinent Vitals/Pain Pain Assessment: 0-10 Pain Score: 9  Pain Location: right shoulder hurting more today after VAC replacement.   Low back hurting - more spasms Pain Descriptors / Indicators: Discomfort;Grimacing;Guarding;Moaning Pain Intervention(s): Limited activity within patient's tolerance;Monitored during session;Repositioned;Premedicated before session    Home Living                      Prior Function            PT Goals (current goals can now be found in the care plan section) Progress towards PT goals: Progressing  toward goals    Frequency    Min 3X/week      PT Plan Current plan remains appropriate    Co-evaluation              AM-PAC PT "6 Clicks" Mobility   Outcome Measure    Help needed moving from lying on your back to sitting on the side of a flat bed without using bedrails?: Total Help needed moving to and from a bed to a chair (including a wheelchair)?: Total Help needed standing up from a chair using your arms (e.g., wheelchair or bedside chair)?: Total Help needed to walk in hospital room?: Total Help needed climbing 3-5 steps with a railing? : Total 6 Click Score: 5    End of Session   Activity Tolerance: Patient limited by fatigue;Patient limited by pain Patient left: in bed;with call bell/phone within reach;with bed alarm set Nurse Communication: Mobility status PT Visit Diagnosis: Muscle weakness (generalized) (M62.81);Other abnormalities of gait and mobility (R26.89);Pain;Difficulty in walking, not elsewhere classified (R26.2) Pain - Right/Left: Left Pain - part of body: Shoulder     Time: 1350-1425 PT Time Calculation (min) (ACUTE ONLY): 35 min  Charges:  $Therapeutic Activity: 23-37 mins                     09/08/2019   Rande Lawman, PT    Loyal Buba 09/08/2019, 2:41 PM

## 2019-09-08 NOTE — Progress Notes (Signed)
NIF > -40   VC 3.5L    Good patient effort

## 2019-09-08 NOTE — Progress Notes (Signed)
NIF -40  VC 3.6L  Good pt effort

## 2019-09-08 NOTE — Progress Notes (Signed)
      [name]       [name]             [phone]        8 Days Post-Op Procedure(s) (LRB): LEFT STERNOCLAVICULAR WOUND DEBRIDEMENT WITH APPLICATION OF WOUND VAC (Left)  Subjective: He states Flexeril works best. After taking Oxy, he feels anxious and has to take several deep breaths.  Objective: Vital signs in last 24 hours: Temp:  [97.7 F (36.5 C)-98.1 F (36.7 C)] 97.7 F (36.5 C) (09/09 0531) Pulse Rate:  [72-81] 72 (09/09 0531) Cardiac Rhythm: Normal sinus rhythm (09/08 1900) Resp:  [14-19] 14 (09/09 0531) BP: (120-127)/(61-70) 120/61 (09/09 0531) SpO2:  [95 %-98 %] 95 % (09/09 0531)   Current Weight  08/30/19 117.9 kg      Intake/Output from previous day: 09/08 0701 - 09/09 0700 In: 2131.4 [P.O.:720; I.V.:10; IV Piggyback:1401.4] Out: 3670 [Urine:3500; Drains:170]   Physical Exam:  Cardiovascular: RRR Pulmonary: Clear to auscultation bilaterally Wounds: VAC in place on left [place] joint.  Lab Results: CBC: Recent Labs    09/08/19 0447  WBC 11.1*  HGB 15.1  HCT 44.8  PLT 194   BMET:  Recent Labs    09/07/19 0350 09/08/19 0447  NA 134* 134*  K 4.4 3.9  CL 100 97*  CO2 24 26  GLUCOSE 115* 149*  BUN 10 15  CREATININE 0.80 0.93  CALCIUM 9.1 9.4    PT/INR:  Lab Results  Component Value Date   INR 1.2 08/31/2019   INR 1.1 08/27/2019   INR 1.31 12/16/2016   PROTIME 20.7 06/13/2009   ABG:  INR: Will add last result for INR, ABG once components are confirmed Will add last 4 CBG results once components are confirmed  Assessment/Plan: 1. CV-History of PAF. On Tikosyn 500 mcg bid and Dabigatran 150 mg bid 1. S/p I and D of left [place] joint-wound VAC will be changed today. 2. ID-on Cefazolin and Naficilline for MSSA bacteremia. 3. Flexeril helps with muscle spasms. Regarding complaints of back pain, degenerative vs discitis etiology? 4. Management per primary  [name] Zenner M ZimmermanPA-C 09/08/2019,8:22  AM [phone]

## 2019-09-09 LAB — CBC WITH DIFFERENTIAL/PLATELET
Abs Immature Granulocytes: 0.04 10*3/uL (ref 0.00–0.07)
Basophils Absolute: 0 10*3/uL (ref 0.0–0.1)
Basophils Relative: 0 %
Eosinophils Absolute: 0.2 10*3/uL (ref 0.0–0.5)
Eosinophils Relative: 2 %
HCT: 44.7 % (ref 39.0–52.0)
Hemoglobin: 14.9 g/dL (ref 13.0–17.0)
Immature Granulocytes: 0 %
Lymphocytes Relative: 14 %
Lymphs Abs: 1.6 10*3/uL (ref 0.7–4.0)
MCH: 30.5 pg (ref 26.0–34.0)
MCHC: 33.3 g/dL (ref 30.0–36.0)
MCV: 91.4 fL (ref 80.0–100.0)
Monocytes Absolute: 0.8 10*3/uL (ref 0.1–1.0)
Monocytes Relative: 8 %
Neutro Abs: 8.2 10*3/uL — ABNORMAL HIGH (ref 1.7–7.7)
Neutrophils Relative %: 76 %
Platelets: 187 10*3/uL (ref 150–400)
RBC: 4.89 MIL/uL (ref 4.22–5.81)
RDW: 13.8 % (ref 11.5–15.5)
WBC: 10.9 10*3/uL — ABNORMAL HIGH (ref 4.0–10.5)
nRBC: 0 % (ref 0.0–0.2)

## 2019-09-09 LAB — GLUCOSE, CAPILLARY
Glucose-Capillary: 147 mg/dL — ABNORMAL HIGH (ref 70–99)
Glucose-Capillary: 144 mg/dL — ABNORMAL HIGH (ref 70–99)
Glucose-Capillary: 101 mg/dL — ABNORMAL HIGH (ref 70–99)
Glucose-Capillary: 136 mg/dL — ABNORMAL HIGH (ref 70–99)

## 2019-09-09 LAB — BASIC METABOLIC PANEL
Anion gap: 9 (ref 5–15)
BUN: 14 mg/dL (ref 8–23)
CO2: 27 mmol/L (ref 22–32)
Calcium: 9.4 mg/dL (ref 8.9–10.3)
Chloride: 99 mmol/L (ref 98–111)
Creatinine, Ser: 0.9 mg/dL (ref 0.61–1.24)
GFR calc Af Amer: >60 mL/min (ref >60)
GFR calc non Af Amer: >60 mL/min (ref >60)
Glucose, Bld: 141 mg/dL — ABNORMAL HIGH (ref 70–99)
Potassium: 3.7 mmol/L (ref 3.5–5.1)
Sodium: 135 mmol/L (ref 135–145)

## 2019-09-09 LAB — MAGNESIUM: Magnesium: 1.8 mg/dL (ref 1.7–2.4)

## 2019-09-09 MED ORDER — ENSURE MAX PROTEIN PO LIQD
11.0000 [oz_av] | Freq: Two times a day (BID) | ORAL | Status: DC
  Administered 2019-09-10 – 2019-09-14 (×4): 11 [oz_av] via ORAL
  Filled 2019-09-09 (×11): qty 330

## 2019-09-09 MED ORDER — POTASSIUM CHLORIDE CRYS ER 20 MEQ PO TBCR
40.0000 meq | EXTENDED_RELEASE_TABLET | Freq: Two times a day (BID) | ORAL | Status: DC
  Administered 2019-09-09 – 2019-09-15 (×12): 40 meq via ORAL
  Filled 2019-09-09 (×13): qty 2

## 2019-09-09 MED ORDER — CYCLOBENZAPRINE HCL 10 MG PO TABS
10.0000 mg | ORAL_TABLET | Freq: Three times a day (TID) | ORAL | Status: DC | PRN
  Administered 2019-09-10 – 2019-09-15 (×8): 10 mg via ORAL
  Filled 2019-09-09 (×8): qty 1

## 2019-09-09 MED ORDER — ADULT MULTIVITAMIN W/MINERALS CH
1.0000 | ORAL_TABLET | Freq: Every day | ORAL | Status: DC
  Administered 2019-09-09 – 2019-09-15 (×7): 1 via ORAL
  Filled 2019-09-09 (×7): qty 1

## 2019-09-09 NOTE — Progress Notes (Signed)
RT setup patient home CPAP at bedside within patients reach with 2L O2 bled in.  Sterile water added to water chamber.

## 2019-09-09 NOTE — Progress Notes (Addendum)
Initial Nutrition Assessment  RD working remotely.  DOCUMENTATION CODES:   Obesity unspecified  INTERVENTION:   -MVI with minerals daily -Ensure Max po BID, each supplement provides 150 kcal and 30 grams of protein -Magic cup TID with meals, each supplement provides 290 kcal and 9 grams of protein  NUTRITION DIAGNOSIS:   Increased nutrient needs related to post-op healing as evidenced by estimated needs.  GOAL:   Patient will meet greater than or equal to 90% of their needs  MONITOR:   PO intake, Supplement acceptance, Labs, Weight trends, Skin, I & O's  REASON FOR ASSESSMENT:   LOS    ASSESSMENT:   [name] [name]. is a 68 y.o. male with medical history significant of OSA on CPAP; perforated appy (2015); obesity; CAD s/p stents; HLD; DM; optic nerve neuritis; and afib on Pradaxa presenting with back pain.  At the beginning of August, he saw his eye doctor and had left optic nerve inflammation (had h/o R eye issue similarly) - he was admitted to [facility] for steroids and then had them transitioned to PO for 2 weeks.  After he was discharged, he developed RLE cellulitis.  He called his PCP and was placed on antibiotic, Doxycycline.  He finished the antibiotic and steroid and around that time noticed back pain in the mid back around his waist.  He had been walking 2 miles a day, then 1, then unable due to worsening pain.  He saw orthopedics, had a negative xray.  He was offered an injection but opted for PT instead.  2 days later, the pain has escalated and he came to the ER 1 week ago.  He had a CTA and xray, discovered a small aneurysm.  He took a muscle relaxer and pain medication with mild pain control.  He is unable to walk, lies flat on his back, minimal PO intake, using a urinal due to inability to walk.  He has lost maybe 20 pounds.  The inability to walk is due to such severe pain, not because he is weak.  No night sweats.  No fevers.  No urinary symptoms other than occasional  testicular/bladder discomfort.  No urinary or fecal incontinence.  Last BM was maybe 2 weeks ago.  This AM, he noticed inability to move his arms.  Pain under clavicles and in pectoralis muscles up into neck on both sides.  Pt admitted with myelopathy with history of optic neuritis. Pt found to hae MSSA bacteremia with spine infection and probable lt sternoclavicular joint infection.  8/29- MRI of lumbar spine revealed discitis/osteomyelitis at L4-5 9/1- s/p Procedure(s): LEFT STERNOCLAVICULAR WOUND DEBRIDEMENT WITH APPLICATION OF WOUND VAC (Left) 9/3- PICC placed  Reviewed I/O's: -120 ml x 24 hours and -6.9 L since admission  UOP: 600 ml x 24 hours  Pt with variable appetite; noted meal completion 25-100%, averaging around 50-755. Pt with increased nutrient needs for post-op surgical healing and wound healing and would greatly benefit from addition of nutritional supplements to help meet nutritional needs.   Reviewed wt hx; pt has experienced a 9.4% wt loss over the past 6 months, which while not significant for time frame is concerning given decreased oral intake, decreased functional decline, and increased nutrient needs s/p surgery.  Per CIR admissions and therapies notes, currently recommending SNF. Pt making very slow progress and would likely not tolerate vigor of CIR program at this time.   Medications reviewed and include miralax and senna.   Lab Results  Component Value Date  HGBA1C 6.7 (H) 07/21/2019   PTA DM medications are 500 mg metformin TID.   Labs reviewed: CBGS: 128-162 (inpatient orders for glycemic control are 0-15 units insulin aspart TID with meals, 0-5 units insulin aspart q HS ).   Diet Order:   Diet Order            Diet heart healthy/carb modified Room service appropriate? Yes; Fluid consistency: Thin  Diet effective now              EDUCATION NEEDS:   No education needs have been identified at this time  Skin:  Skin Assessment: Skin Integrity  Issues: Skin Integrity Issues:: Wound VAC, Other (Comment) Wound Vac: sternum Other: MASD to sacrum  Last BM:  09/07/19  Height:   Ht Readings from Last 1 Encounters:  08/30/19 6\' 1"  (1.854 m)    Weight:   Wt Readings from Last 1 Encounters:  08/30/19 117.9 kg    Ideal Body Weight:  83.6 kg  BMI:  Body mass index is 34.29 kg/m.  Estimated Nutritional Needs:   Kcal:  2300-2500  Protein:  125-140 grams  Fluid:  > 2.3 L    [name] [name] A. Jimmye Norman, RD, LDN, [place] Registered Dietitian II Certified Diabetes Care and Education Specialist Pager: [phone] After hours Pager: [phone]

## 2019-09-09 NOTE — Progress Notes (Addendum)
CSW intern attempted to assess patient for SNF but he was on the phone. Will attempt at a later time.   Malena Peer MSW Intern

## 2019-09-09 NOTE — Progress Notes (Signed)
Pt did > -40 on the NIF.

## 2019-09-09 NOTE — Progress Notes (Signed)
9 Days Post-Op Procedure(s) (LRB): LEFT STERNOCLAVICULAR WOUND DEBRIDEMENT WITH APPLICATION OF WOUND VAC (Left) Subjective: Resting quietly in bed with C-PAP in place. Awakened easily.  He says he is feeling better overall with less pain and improved mobility of his left shoulder. He was encouraged that he was able to sit up on the bedside for 9 minutes yesterday.  He acknowledged he is making slow progress with mobility.    Objective: Vital signs in last 24 hours: Temp:  [97.5 F (36.4 C)-97.8 F (36.6 C)] 97.8 F (36.6 C) (09/10 0531) Pulse Rate:  [74-75] 74 (09/10 0531) Resp:  [14-18] 14 (09/10 0531) BP: (103-114)/(53-72) 103/63 (09/10 0531) SpO2:  [94 %-96 %] 96 % (09/10 0531)      Intake/Output from previous day: 09/09 0701 - 09/10 0700 In: 180 [P.O.:180] Out: 600 [Urine:600] Intake/Output this shift: No intake/output data recorded.  General appearance: alert, cooperative and no distress Wound: A black vac sponge is packed in the left chest wound but the tubing has torn away from the plastic drape. There is no drainage and the surrounding tissues are free of erythema or tenderness.    Lab Results: Recent Labs    09/08/19 0447 09/09/19 0332  WBC 11.1* 10.9*  HGB 15.1 14.9  HCT 44.8 44.7  PLT 194 187   BMET:  Recent Labs    09/08/19 0447 09/09/19 0332  NA 134* 135  K 3.9 3.7  CL 97* 99  CO2 26 27  GLUCOSE 149* 141*  BUN 15 14  CREATININE 0.93 0.90  CALCIUM 9.4 9.4    PT/INR: No results for input(s): LABPROT, INR in the last 72 hours. ABG    Component Value Date/Time   TCO2 26 03/01/2017 0258   CBG (last 3)  Recent Labs    09/08/19 1656 09/08/19 2140 09/09/19 0816  GLUCAP 128* 162* 147*    Assessment/Plan: S/P Procedure(s) (LRB): LEFT STERNOCLAVICULAR WOUND DEBRIDEMENT WITH APPLICATION OF WOUND VAC (Left)    LOS: 13 days    -POD9 incision and drainage of left [place] joint abscess. Discussed the disrupted vac dressing with his RN who will  replace the dressing this AM.  Will plan to resume M-W-F dressing changes tomorrow.  Continue IV Cefazolin and nafcillin for MSSA cultured from the wound He would eventually benefit from inpatient rehab but he is not yet able to tolerate that level of therapy.    [name], PA-C [phone] 09/09/2019

## 2019-09-09 NOTE — Progress Notes (Signed)
Physical Therapy Treatment Patient Details Name: [name] [name]. MRN: [id number] DOB: December 19, 1951 Today's Date: 09/09/2019    History of Present Illness [name] [name]. is a 68 y.o. male with medical history significant of OSA on CPAP; perforated appy (2015); obesity; CAD s/p stents; HLD; DM; optic nerve neuritis; and afib on Pradaxa presenting with back pain.    PT Comments    Patient received in bed, reports he was going to take a nap as he had pain med. Agrees to PT. Reports decreased pain in low back this session. Required decreased assistance with all bed mobility. Tolerates sitting edge of bed x 5 minutes and then needs to lie back down due to fatigue and pain. (pain mostly in L [place] joint this time.). He is motivated to improve and is making progress, but slowly.       Follow Up Recommendations  CIR     Equipment Recommendations  Other (comment)(TBD)    Recommendations for Other Services       Precautions / Restrictions Precautions Precautions: Fall Precaution Comments: wound vac L [place] joint intact Restrictions Weight Bearing Restrictions: No    Mobility  Bed Mobility Overal bed mobility: Needs Assistance Bed Mobility: Rolling;Sidelying to Sit;Sit to Sidelying Rolling: Min guard Sidelying to sit: Min assist;HOB elevated     Sit to sidelying: Min assist;HOB elevated General bed mobility comments: Patient only required assistance with LEs on and off bed this session. He was able to roll with min guard and was able to scoot up in the bed with bed in trendelenburg.  Transfers                 General transfer comment: unable due to pain and dizziness with sitting  Ambulation/Gait             General Gait Details: unable   Stairs             Wheelchair Mobility    Modified Rankin (Stroke Patients Only)       Balance Overall balance assessment: Needs assistance Sitting-balance support: Feet supported;Bilateral upper extremity  supported Sitting balance-Leahy Scale: Fair Sitting balance - Comments: support to maintain balance ~5 minutes this date given pain, strong BUE support to offload pain. his arms fatiqued first - requiring him to lay back down. Leans to right in sittin gdue to pain on left side of back and [place] joint Postural control: Right lateral lean                                  Cognition Arousal/Alertness: Awake/alert Behavior During Therapy: WFL for tasks assessed/performed Overall Cognitive Status: Within Functional Limits for tasks assessed                                 General Comments: Pt cooperative and works hard. He wants to get up into a chair.      Exercises Other Exercises Other Exercises: pt did heel slides x 5 reps each leg, AP, encouraged patient to move in the bed as able UE and LEs.    General Comments        Pertinent Vitals/Pain Pain Assessment: Faces Faces Pain Scale: Hurts whole lot Pain Location: L shoulder painful with sitting today. Decreased back pain per patient Pain Descriptors / Indicators: Grimacing;Guarding;Discomfort;Sore Pain Intervention(s): Monitored during session;Repositioned;Limited activity within patient's tolerance  Home Living                      Prior Function            PT Goals (current goals can now be found in the care plan section) Acute Rehab PT Goals Patient Stated Goal: to decrease pain, get up to chair PT Goal Formulation: With patient Time For Goal Achievement: 09/13/19 Potential to Achieve Goals: Good Progress towards PT goals: Progressing toward goals    Frequency    Min 3X/week      PT Plan Current plan remains appropriate    Co-evaluation              AM-PAC PT "6 Clicks" Mobility   Outcome Measure  Help needed turning from your back to your side while in a flat bed without using bedrails?: A Lot Help needed moving from lying on your back to sitting on the side of a  flat bed without using bedrails?: A Lot Help needed moving to and from a bed to a chair (including a wheelchair)?: Total Help needed standing up from a chair using your arms (e.g., wheelchair or bedside chair)?: Total Help needed to walk in hospital room?: Total Help needed climbing 3-5 steps with a railing? : Total 6 Click Score: 8    End of Session   Activity Tolerance: Patient limited by fatigue;Patient limited by pain Patient left: in bed;with bed alarm set;with call bell/phone within reach Nurse Communication: Mobility status PT Visit Diagnosis: Muscle weakness (generalized) (M62.81);Other abnormalities of gait and mobility (R26.89);Difficulty in walking, not elsewhere classified (R26.2);Pain Pain - Right/Left: Left Pain - part of body: Shoulder(back)     Time: [id number] PT Time Calculation (min) (ACUTE ONLY): 35 min  Charges:  $Therapeutic Exercise: 8-22 mins $Therapeutic Activity: 8-22 mins                     Siyona Coto, PT, GCS 09/09/19,10:55 AM

## 2019-09-09 NOTE — Progress Notes (Signed)
Inpatient Rehabilitation Admissions Coordinator  Patient making slow progress. He may need SNF rehab to give him a prolonged recovery time with his pain issues and low tolerance for more intensive therapies. I will follow at a distance.  Danne Baxter, RN, MSN Rehab Admissions Coordinator [phone] 09/09/2019 11:51 AM

## 2019-09-09 NOTE — Progress Notes (Signed)
Patient ID: [name] Rayford., male   DOB: [birth year], 68 y.o.   MRN: [id number]  PROGRESS NOTE    [name].  [id number] DOB: [birth year] DOA: 08/27/2019 PCP: Burnis Medin, MD   Brief Narrative:  68 year old male with history of OSA on CPAP, perforated appendicitis in 2015, CAD status post stents, hyperlipidemia, diabetes mellitus type 2, optic neuritis with recent hospitalization at [facility] treated with intravenous and subsequent oral steroids, recent right lower extremity cellulitis treated as an outpatient with oral doxycycline, A. fib on Pradaxa presented with back pain and progressively worsening difficulty walking.  Neurology was consulted.  He was found to have MSSA bacteremia with question of L4-L5 discitis/osteomyelitis.  He was started on Ancef.  ID was consulted.  Subsequently, he was found to have infected left sternoclavicular joint.  CT surgery was consulted.  He underwent I&D with application of wound VAC by CT surgery.  ID recommends nafcillin for 14 days till 09/15/2019 then changed to cefazolin on 09/16/2019 through 10/12/2019.  Assessment & Plan:  MSSA bacteremia Question of L4-L5 discitis/osteomyelitis -Repeat blood cultures from 08/29/2019 have been negative so far. Echo showed no valvular vegetations.  Spoke to neurosurgery/Dr. Annette Stable on phone on 08/28/2019 and he reviewed the MRI lumbar spine and recommended no surgical intervention at this time. -ID recommended nafcillin for 14 days till 09/15/2019 then changed to cefazolin on 09/16/2019 through 10/12/2019.  Infected left sternoclavicular joint status post I&D with application of wound VAC by CT surgery -Surgical/deep tissue culture grew staph aureus -CT surgery following.  Wound and wound VAC care as per CT surgery recommendations.  Antibiotic plan as above.  Left shoulder pain -Improving. 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   -MRI of the cervical and thoracic spine showed degenerative changes without cord signal abnormality -MRI of the brain showed question of punctate acute infarctions.    Neurology was not convinced about this for now.  CTA of the head and neck showed no evidence of CNS vasculitis -PT/OT recommends CIR. patient is still very slow to progress with PT.  CIR following. -Neurology has signed off.  Leukocytosis -Resolved.  Thrombocytopenia -Questionable cause.    Resolved.  Dehydration Acute kidney injury -Resolved.  Off IV fluids.  Paroxysmal A. fib on Pradaxa -Continue Tikosyn.  Check magnesium daily. -Continue Pradaxa.  Diabetes mellitus type 2 -A1c 6.7 on 07/21/2019 -Glucophage held -Continue CBGs with SSI  Hyperlipidemia -Continue Lipitor  OSA -Continue CPAP at bedside  Obesity -Outpatient follow-up  Generalized deconditioning/weakness-might need CIR placement.  Patient is still very deconditioned and has shown very slow progression with PT.  DVT prophylaxis:  Pradaxa Code Status: Full Family Communication:  None at bedside  disposition Plan: Might need CIR placement  Consultants: Neurology/ID/IR/CT surgery/orthopedics.  Spoke to neurosurgery/Dr. Annette Stable on 08/28/2019.  Procedures:  Echo IMPRESSIONS   1. The left ventricle has normal systolic function with an ejection fraction of 60-65%. The cavity size was normal. There is mildly increased left ventricular wall thickness. Left ventricular diastolic Doppler parameters are consistent with impaired  relaxation. Indeterminate filling pressures The E/e' is 8-15. No  evidence of left ventricular regional wall motion abnormalities. 2. The right ventricle  has normal systolic function. The cavity was normal. There is no increase in right ventricular wall thickness. 3. Left atrial size was mildly dilated. 4. The mitral valve is abnormal. Mild thickening of the mitral valve leaflet. No mitral valve vegetation visualized. 5. The tricuspid valve is grossly normal. 6. No vegetation on the aortic valve. 7. The aortic valve is tricuspid. Mild sclerosis of the aortic valve. Aortic valve regurgitation is trivial by color flow Doppler. No stenosis of the aortic valve. 8. The aorta is normal unless otherwise noted.  SUMMARY  LVEF 60-65%, mild LVH, normal wall motion, grade 1 DD, indeterminate LV filling pressure, normal RV function, mild LAE, aortic valve sclerosis with trivial AI, no valvular vegetations  Antimicrobials:  Anti-infectives (From admission, onward)   Start     Dose/Rate Route Frequency Ordered Stop   09/16/19 0600  ceFAZolin (ANCEF) IVPB 2g/100 mL premix     2 g 200 mL/hr over 30 Minutes Intravenous Every 8 hours 09/02/19 1102     08/31/19 2000  nafcillin 2 g in sodium chloride 0.9 % 100 mL IVPB     2 g 200 mL/hr over 30 Minutes Intravenous Every 4 hours 08/31/19 1344 09/15/19 2359   08/31/19 1300  amoxicillin (AMOXIL) capsule 500 mg     500 mg Oral  Once 08/31/19 1117 08/31/19 1227   08/31/19 1258  ceFAZolin (ANCEF) IVPB 2g/100 mL premix  Status:  Discontinued     2 g 200 mL/hr over 30 Minutes Intravenous 30 min pre-op 08/31/19 1258 08/31/19 1713   08/28/19 0300  ceFAZolin (ANCEF) IVPB 2g/100 mL premix  Status:  Discontinued     2 g 200 mL/hr over 30 Minutes Intravenous Every 8 hours 08/28/19 0225 08/31/19 1117      Subjective: Patient seen and examined at bedside.  Denies overnight fever, nausea, vomiting.  Still very weak.  No worsening back pain. Objective: Vitals:   09/08/19 0531 09/08/19 1512 09/08/19 2138 09/09/19 0531   BP: 120/61 114/72 (!) 112/53 103/63  Pulse: 72 75 74 74  Resp: 14 18 16 14   Temp: 97.7 F (36.5 C) 97.6 F (36.4 C) (!) 97.5 F (36.4 C) 97.8 F (36.6 C)  TempSrc: Oral  Oral Oral  SpO2: 95% 96% 94% 96%  Weight:      Height:        Intake/Output Summary (Last 24 hours) at 09/09/2019 0749 Last data filed at 09/08/2019 2147 Gross per 24 hour  Intake 180 ml  Output 600 ml  Net -420 ml   Filed Weights   08/29/19 0500 08/30/19 0416  Weight: 118 kg 117.9 kg    Examination:  General exam: No acute distress.  Looks chronically ill. Respiratory system: Bilateral decreased breath sounds at bases with some scattered crackles Cardiovascular system: S1-S2 heard, rate controlled Gastrointestinal system: Abdomen is nondistended, soft and nontender. Normal bowel sounds heard. Extremities: No cyanosis; trace edema   Data Reviewed: I have personally reviewed following labs and imaging studies  CBC: Recent Labs  Lab 09/03/19 0408 09/04/19 0343 09/08/19 0447 09/09/19 0332  WBC 10.0 10.4 11.1* 10.9*  NEUTROABS 6.8 7.3 8.3* 8.2*  HGB 15.1 14.5 15.1 14.9  HCT 46.2 43.4 44.8 44.7  MCV 93.3 91.8 91.1 91.4  PLT 147* 153 194 [id number]   Basic Metabolic Panel: Recent Labs  Lab 09/05/19 0316 09/06/19 0340 09/07/19 0350 09/08/19 0447 09/09/19 0332  NA 138 138 134* 134* 135  K 3.8 3.6 4.4 3.9 3.7  CL 103 101 100 97* 99  CO2 30 27 24 26 27   GLUCOSE 141* 132* 115* 149* 141*  BUN 15 13 10 15 14   CREATININE 0.87 0.91 0.80 0.93 0.90  CALCIUM 8.9 9.0 9.1 9.4 9.4  MG 1.8 1.8 1.8 1.8 1.8   GFR: Estimated Creatinine Clearance: 104.2 mL/min (by C-G formula based on SCr of 0.9 mg/dL). Liver Function Tests: No results for input(s): AST, ALT, ALKPHOS, BILITOT, PROT, ALBUMIN in the last 168 hours. No results for input(s): LIPASE, AMYLASE in the last 168 hours. No results for input(s): AMMONIA in the last 168 hours. Coagulation Profile: No results for input(s): INR, PROTIME in the last 168  hours. Cardiac Enzymes: No results for input(s): CKTOTAL, CKMB, CKMBINDEX, TROPONINI in the last 168 hours. BNP (last 3 results) No results for input(s): PROBNP in the last 8760 hours. HbA1C: No results for input(s): HGBA1C in the last 72 hours. CBG: Recent Labs  Lab 09/07/19 2106 09/08/19 0757 09/08/19 1226 09/08/19 1656 09/08/19 2140  GLUCAP 185* 142* 127* 128* 162*   Lipid Profile: No results for input(s): CHOL, HDL, LDLCALC, TRIG, CHOLHDL, LDLDIRECT in the last 72 hours. Thyroid Function Tests: No results for input(s): TSH, T4TOTAL, FREET4, T3FREE, THYROIDAB in the last 72 hours. Anemia Panel: No results for input(s): VITAMINB12, FOLATE, FERRITIN, TIBC, IRON, RETICCTPCT in the last 72 hours. Sepsis Labs: No results for input(s): PROCALCITON, LATICACIDVEN in the last 168 hours.  Recent Results (from the past 240 hour(s))  Surgical PCR screen     Status: None   Collection Time: 08/31/19  1:47 PM   Specimen: Nasal Mucosa; Nasal Swab  Result Value Ref Range Status   MRSA, PCR NEGATIVE NEGATIVE Final   Staphylococcus aureus NEGATIVE NEGATIVE Final    Comment: (NOTE) The Xpert SA Assay (FDA approved for NASAL specimens in patients 26 years of age and older), is one component of a comprehensive surveillance program. It is not intended to diagnose infection nor to guide or monitor treatment. Performed at [facility], [place] [address] [place], [place] [address]   Fungus Culture With Stain     Status: None (Preliminary result)   Collection Time: 08/31/19  3:24 PM   Specimen: Other Source; Body Fluid  Result Value Ref Range Status   Fungus Stain Final report  Final    Comment: (NOTE) Performed At: [facility] [place], [place] [id number] Rush Farmer MD [id number]    Fungus (Mycology) Culture PENDING  Incomplete   Fungal Source JOINT FLUID  Final    Comment: STERNOCLAVICULAR Performed at [facility], [place] [address]  [place], [place] [address]   Aerobic/Anaerobic Culture (surgical/deep wound)     Status: None   Collection Time: 08/31/19  3:24 PM   Specimen: Other Source; Body Fluid  Result Value Ref Range Status   Specimen Description JOINT FLUID  Final   Special Requests LEFT STERNOCLAVICULAR  Final   Gram Stain   Final    ABUNDANT WBC PRESENT,BOTH PMN AND MONONUCLEAR FEW GRAM POSITIVE COCCI    Culture   Final    FEW STAPHYLOCOCCUS AUREUS NO ANAEROBES ISOLATED Performed at [facility], [place] [address] [place], [place] [address]    Report Status 09/05/2019 FINAL  Final   Organism ID, Bacteria STAPHYLOCOCCUS AUREUS  Final      Susceptibility   Staphylococcus aureus - MIC*    CIPROFLOXACIN <=0.5 SENSITIVE Sensitive     ERYTHROMYCIN <=0.25 SENSITIVE Sensitive     GENTAMICIN <=0.5 SENSITIVE Sensitive  OXACILLIN <=0.25 SENSITIVE Sensitive     TETRACYCLINE <=1 SENSITIVE Sensitive     VANCOMYCIN 1 SENSITIVE Sensitive     TRIMETH/SULFA <=10 SENSITIVE Sensitive     CLINDAMYCIN <=0.25 SENSITIVE Sensitive     RIFAMPIN <=0.5 SENSITIVE Sensitive     Inducible Clindamycin NEGATIVE Sensitive     * FEW STAPHYLOCOCCUS AUREUS  Acid Fast Smear (AFB)     Status: None   Collection Time: 08/31/19  3:24 PM   Specimen: Other Source; Body Fluid  Result Value Ref Range Status   AFB Specimen Processing Concentration  Final   Acid Fast Smear Negative  Final    Comment: (NOTE) Performed At: [facility] [place], [place] [id number] Rush Farmer MD [id number]    Source (AFB) JOINT FLUID  Final    Comment: STERNOCLAVICULAR Performed at [facility], [place] [address] [place], [place] [address]   Fungus Culture Result     Status: None   Collection Time: 08/31/19  3:24 PM  Result Value Ref Range Status   Result 1 Comment  Final    Comment: (NOTE) KOH/Calcofluor preparation:  no fungus observed. Performed At: [facility] [place], [place]  [id number] Rush Farmer MD [id number]          Radiology Studies: No results found.      Scheduled Meds: . atorvastatin  80 mg Oral QHS  . brimonidine  1 drop Both Eyes TID  . dabigatran  150 mg Oral Q12H  . dofetilide  500 mcg Oral BID  . fluticasone furoate-vilanterol  1 puff Inhalation Daily  . insulin aspart  0-15 Units Subcutaneous TID WC  . insulin aspart  0-5 Units Subcutaneous QHS  . loratadine  10 mg Oral Daily  . pantoprazole  40 mg Oral Daily  . polyethylene glycol  17 g Oral BID  . potassium chloride  40 mEq Oral Daily  . senna-docusate  1 tablet Oral BID  . sodium chloride flush  10-40 mL Intracatheter Q12H   Continuous Infusions: . [START ON 09/16/2019]  ceFAZolin (ANCEF) IV    . nafcillin IV 2 g (09/09/19 0456)     LOS: 13 days        Aline August, MD [facility] 09/09/2019, 7:49 AM

## 2019-09-10 LAB — BASIC METABOLIC PANEL
Anion gap: 10 (ref 5–15)
BUN: 13 mg/dL (ref 8–23)
CO2: 25 mmol/L (ref 22–32)
Calcium: 9.5 mg/dL (ref 8.9–10.3)
Chloride: 100 mmol/L (ref 98–111)
Creatinine, Ser: 0.9 mg/dL (ref 0.61–1.24)
GFR calc Af Amer: >60 mL/min (ref >60)
GFR calc non Af Amer: >60 mL/min (ref >60)
Glucose, Bld: 135 mg/dL — ABNORMAL HIGH (ref 70–99)
Potassium: 4.3 mmol/L (ref 3.5–5.1)
Sodium: 135 mmol/L (ref 135–145)

## 2019-09-10 LAB — GLUCOSE, CAPILLARY
Glucose-Capillary: 145 mg/dL — ABNORMAL HIGH (ref 70–99)
Glucose-Capillary: 138 mg/dL — ABNORMAL HIGH (ref 70–99)
Glucose-Capillary: 164 mg/dL — ABNORMAL HIGH (ref 70–99)
Glucose-Capillary: 141 mg/dL — ABNORMAL HIGH (ref 70–99)

## 2019-09-10 LAB — MAGNESIUM: Magnesium: 1.8 mg/dL (ref 1.7–2.4)

## 2019-09-10 NOTE — Progress Notes (Signed)
Orthopedic Tech Progress Note Patient Details:  [name] [name] 1951/04/24 [id number] Called in order to [facility] for a [place]  Patient ID: [name] Gladstone., male   DOB: 1951-09-10, 68 y.o.   MRN: [id number]   [name] [name] 09/10/2019, 3:35 PM

## 2019-09-10 NOTE — Progress Notes (Signed)
10 Days Post-Op Procedure(s) (LRB): LEFT STERNOCLAVICULAR WOUND DEBRIDEMENT WITH APPLICATION OF WOUND VAC (Left) Subjective: Awake and alert, no new concerns but says he is feel discouraged by his weakness and lack of progress with mobility. Shoulder pain and ROM continue to improve but he is most limited by "back spasms".   Objective: Vital signs in last 24 hours: Temp:  [98.1 F (36.7 C)-98.4 F (36.9 C)] 98.4 F (36.9 C) (09/11 [id number]) Pulse Rate:  [83-95] 83 (09/11 [id number]) Cardiac Rhythm: Normal sinus rhythm (09/10 1753) Resp:  [17-19] 19 (09/11 0632) BP: (112-118)/(56-72) 118/72 (09/11 0632) SpO2:  [92 %-94 %] 93 % (09/11 0821)     Intake/Output from previous day: 09/10 0701 - 09/11 0700 In: 1790.1 [P.O.:1080; IV Piggyback:700.1] Out: 1805 [Urine:1805] Intake/Output this shift: Total I/O In: -  Out: 400 [Urine:400]  Physical Exam: General appearance: alert, cooperative Wound:The right chest wound was examined during vac change at the bedside today. The sponge ws removed intact. The wound is clean with a <1cm area of slough over a bony prominence medially.  There was some serous drainage in the wound but no purulence. There was minimal granulation.    Lab Results: Recent Labs    09/08/19 0447 09/09/19 0332  WBC 11.1* 10.9*  HGB 15.1 14.9  HCT 44.8 44.7  PLT 194 187   BMET:  Recent Labs    09/09/19 0332 09/10/19 0423  NA 135 135  K 3.7 4.3  CL 99 100  CO2 27 25  GLUCOSE 141* 135*  BUN 14 13  CREATININE 0.90 0.90  CALCIUM 9.4 9.5    PT/INR: No results for input(s): LABPROT, INR in the last 72 hours. ABG    Component Value Date/Time   TCO2 26 03/01/2017 0258   CBG (last 3)  Recent Labs    09/09/19 1659 09/09/19 2119 09/10/19 0807  GLUCAP 101* 136* 145*    Assessment/Plan: S/P Procedure(s) (LRB): LEFT STERNOCLAVICULAR WOUND DEBRIDEMENT WITH APPLICATION OF WOUND VAC (Left)  -POD10 incision and drainage of left [place] joint abscess. The wound is  clean but has minimal granulation. There was minimal slough and further debridement does not appear necessary at this time.  Will continue M-W-F dressing changes. Re-evaluate the wound on Monday.  Continue IV Cefazolin and nafcillin for MSSA cultured from the wound.   LOS: 14 days    [name], [place] [phone] 09/10/2019

## 2019-09-10 NOTE — Plan of Care (Signed)
May go to SNF tomorrow is placement is found   Problem: Education: Goal: Knowledge of General Education information will improve Description: Including pain rating scale, medication(s)/side effects and non-pharmacologic comfort measures Outcome: Progressing   Problem: Health Behavior/Discharge Planning: Goal: Ability to manage health-related needs will improve Outcome: Progressing   Problem: Clinical Measurements: Goal: Ability to maintain clinical measurements within normal limits will improve Outcome: Progressing Goal: Will remain free from infection Outcome: Progressing Goal: Diagnostic test results will improve Outcome: Progressing Goal: Respiratory complications will improve Outcome: Progressing   Problem: Activity: Goal: Risk for activity intolerance will decrease Outcome: Progressing   Problem: Coping: Goal: Level of anxiety will decrease Outcome: Progressing   Problem: Elimination: Goal: Will not experience complications related to bowel motility Outcome: Progressing Goal: Will not experience complications related to urinary retention Outcome: Progressing   Problem: Pain Managment: Goal: General experience of comfort will improve Outcome: Progressing   Problem: Safety: Goal: Ability to remain free from injury will improve Outcome: Progressing   Problem: Skin Integrity: Goal: Risk for impaired skin integrity will decrease Outcome: Progressing

## 2019-09-10 NOTE — NC FL2 (Signed)
[place] LEVEL OF CARE SCREENING TOOL     IDENTIFICATION  Patient Name: [name] [name]. Birthdate: September 03, 1951 Sex: male Admission Date (Current Location): 08/27/2019  [facility] and [place] Number:  Herbalist and Address:  [facility], [place] [address] [place], [place] [address]      Provider Number: [id number]  Attending Physician Name and Address:  Aline August, MD  Relative Name and Phone Number:  Sonia Side, spouse, [phone]    Current Level of Care: Hospital Recommended Level of Care: [place] Prior Approval Number:    Date Approved/Denied:   PASRR Number: [id number]  Discharge Plan: SNF    Current Diagnoses: Patient Active Problem List   Diagnosis Date Noted  . Suspected infectious meningitis 09/01/2019  . MSSA bacteremia 08/30/2019  . Discitis of lumbar region 08/30/2019  . Left shoulder pain 08/30/2019  . Septic arthritis of sternoclavicular joint, left ([place]) 08/30/2019  . Myelopathy ([place]) 08/27/2019  . H/O optic neuritis 08/27/2019  . Acute back pain with sciatica, right 09/04/2017  . Right hip pain 09/04/2017  . Thrombocytopenia ([place]) 06/25/2016  . Atrial fibrillation ([place]) 01/01/2016  . 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 Iliac aneurysm ([place])   . Morbid obesity ([place]) 07/08/2014  . Type 2 diabetes mellitus with other circulatory complications ([place]) 0000000  . Chronic anticoagulation 12/21/2013  . Sleep disorder 06/21/2013  . Decreased hearing 08/06/2012  . Hemorrhoids 08/06/2012  . High risk medication use 05/05/2012  . Anticoagulation management encounter 05/05/2012  . Retinal tear 04/29/2012  . Personal history of colonic polyps 05/05/2011  . Visit for preventive health examination 05/05/2011  . PALPITATIONS 08/16/2010  . LIVER FUNCTION TESTS, ABNORMAL 04/25/2010  . Lateral epicondylitis 10/24/2009  . TOBACCO USE, QUIT 10/24/2009  . OBESITY, UNSPECIFIED 04/24/2009  .  Hyperlipidemia 03/22/2009  . ERECTILE DYSFUNCTION 03/22/2009  . Obstructive sleep apnea 03/22/2009  . 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 MYOCARDIAL INFARCTION, HX OF 03/22/2009  . Coronary atherosclerosis 03/22/2009  . Atrial fibrillation with RVR ([place]) 03/22/2009  . GERD 03/22/2009    Orientation RESPIRATION BLADDER Height & Weight     Self, Time, Situation, Place  Normal(Has a home CPAP machine that will come with him) Continent Weight: 259 lb 14.8 oz (117.9 kg) Height:  6\' 1"  (185.4 cm)  BEHAVIORAL SYMPTOMS/MOOD NEUROLOGICAL BOWEL NUTRITION STATUS      Continent Diet(Please see DC Summary)  AMBULATORY STATUS COMMUNICATION OF NEEDS Skin   Extensive Assist Verbally Surgical wounds, Wound Vac(Has a wound vac on his chest-will need to be ordered at SNF)                       Personal Care Assistance Level of Assistance  Feeding, Dressing, Bathing Bathing Assistance: Maximum assistance Feeding assistance: Maximum assistance Dressing Assistance: Maximum assistance     Functional Limitations Info  Sight, Hearing, Speech Sight Info: Adequate Hearing Info: Adequate Speech Info: Adequate    SPECIAL CARE FACTORS FREQUENCY  PT (By licensed PT), OT (By licensed OT)     PT Frequency: 5x OT Frequency: 3x            Contractures Contractures Info: Not present    Additional Factors Info  Code Status, Allergies, Insulin Sliding Scale Code Status Info: Full Allergies Info: Novocain (Procaine), Pseudoephedrine, Quinolones, Sulfonamide Derivatives, Cardizem (Diltiazem), Pseudoephedrine Hcl   Insulin Sliding Scale Info: See DC Summary for dose       Current Medications (  09/10/2019):  This is the current hospital active medication list Current Facility-Administered Medications  Medication Dose Route Frequency Provider Last Rate Last Dose  . acetaminophen (TYLENOL) tablet 650 mg  650 mg Oral Q6H PRN Barrett, Erin R, PA-C   650 mg at 08/30/19 0947   Or  . acetaminophen  (TYLENOL) suppository 650 mg  650 mg Rectal Q6H PRN Barrett, Erin R, PA-C      . albuterol (PROVENTIL) (2.5 MG/3ML) 0.083% nebulizer solution 2.5 mg  2.5 mg Nebulization Q4H PRN Barrett, Erin R, PA-C      . alum & mag hydroxide-simeth (MAALOX/MYLANTA) 200-200-20 MG/5ML suspension 30 mL  30 mL Oral Q6H PRN [place] Friendly, MD   30 mL at 09/06/19 1243  . atorvastatin (LIPITOR) tablet 80 mg  80 mg Oral QHS Barrett, Erin R, PA-C   80 mg at 09/09/19 2128  . bisacodyl (DULCOLAX) suppository 10 mg  10 mg Rectal Daily PRN Barrett, Erin R, PA-C   10 mg at 09/01/19 1448  . brimonidine (ALPHAGAN) 0.15 % ophthalmic solution 1 drop  1 drop Both Eyes TID Barrett, Erin R, PA-C   1 drop at 09/10/19 0913  . [START ON 09/16/2019] ceFAZolin (ANCEF) IVPB 2g/100 mL premix  2 g Intravenous Q8H Dixon, Melton Krebs, NP      . cyclobenzaprine (FLEXERIL) tablet 10 mg  10 mg Oral TID PRN Aline August, MD      . dabigatran (PRADAXA) capsule 150 mg  150 mg Oral Q12H Pierce, Dwayne A, RPH   150 mg at 09/10/19 0912  . dofetilide (TIKOSYN) capsule 500 mcg  500 mcg Oral BID Barrett, Erin R, PA-C   500 mcg at 09/10/19 0911  . fluticasone furoate-vilanterol (BREO ELLIPTA) 200-25 MCG/INH 1 puff  1 puff Inhalation Daily [place] Friendly, MD   1 puff at 09/10/19 0820  . insulin aspart (novoLOG) injection 0-15 Units  0-15 Units Subcutaneous TID WC Barrett, Erin R, PA-C   2 Units at 09/10/19 0913  . insulin aspart (novoLOG) injection 0-5 Units  0-5 Units Subcutaneous QHS Barrett, Erin R, PA-C   2 Units at 09/04/19 2153  . loratadine (CLARITIN) tablet 10 mg  10 mg Oral Daily Barrett, Erin R, PA-C   10 mg at 09/10/19 0911  . morphine 2 MG/ML injection 2 mg  2 mg Intravenous Q4H PRN [place] Friendly, MD   2 mg at 09/03/19 2204  . multivitamin with minerals tablet 1 tablet  1 tablet Oral Daily Aline August, MD   1 tablet at 09/10/19 0911  . nafcillin 2 g in sodium chloride 0.9 % 100 mL IVPB  2 g Intravenous Q4H Dixon, Stephanie  N, NP 200 mL/hr at 09/10/19 0930 2 g at 09/10/19 0930  . ondansetron (ZOFRAN) tablet 4 mg  4 mg Oral Q6H PRN Barrett, Erin R, PA-C       Or  . ondansetron (ZOFRAN) injection 4 mg  4 mg Intravenous Q6H PRN Barrett, Erin R, PA-C      . oxyCODONE-acetaminophen (PERCOCET/ROXICET) 5-325 MG per tablet 1-2 tablet  1-2 tablet Oral Q4H PRN Barrett, Erin R, PA-C   2 tablet at 09/10/19 0908  . pantoprazole (PROTONIX) EC tablet 40 mg  40 mg Oral Daily Barrett, Erin R, PA-C   40 mg at 09/10/19 0911  . polyethylene glycol (MIRALAX / GLYCOLAX) packet 17 g  17 g Oral BID Barrett, Erin R, PA-C   17 g at 09/09/19 1046  . potassium chloride SA (K-DUR) CR tablet 40  mEq  40 mEq Oral BID Aline August, MD   40 mEq at 09/10/19 0911  . protein supplement (ENSURE MAX) liquid  11 oz Oral BID Aline August, MD   11 oz at 09/10/19 0008  . senna-docusate (Senokot-S) tablet 1 tablet  1 tablet Oral BID Barrett, Erin R, PA-C   1 tablet at 09/10/19 0911  . sodium chloride flush (NS) 0.9 % injection 10-40 mL  10-40 mL Intracatheter Q12H [place] Friendly, MD   10 mL at 09/03/19 2208  . sodium chloride flush (NS) 0.9 % injection 10-40 mL  10-40 mL Intracatheter PRN [place] Friendly, MD   10 mL at 09/08/19 0456  . sodium phosphate (FLEET) 7-19 GM/118ML enema 1 enema  1 enema Rectal Once PRN Barrett, Erin R, PA-C      . traZODone (DESYREL) tablet 50 mg  50 mg Oral QHS PRN [place] Friendly, MD   50 mg at 09/10/19 0007     Discharge Medications: Please see discharge summary for a list of discharge medications.  Relevant Imaging Results:  Relevant Lab Results:   Additional Information SSN: [id number]    Needs IV Nafcillin until 09/15/2019 then will change to IV Cefazolin from 09/16/2019 through 10/12/2019.  Benard Halsted, LCSW

## 2019-09-10 NOTE — Consult Note (Addendum)
[place] Nurse wound follow-up consult note VAC dressing change to left chest, PA at the bedside to assess wound appearance Wound type:full thickness post-op wound Wound bed:90% red, 10% yellow, bone palpable Drainage (amount, consistency, odor)mod amt dark red-brown drainage, no odor Periwound:intact Dressing procedure/placement/frequency:one piece of black foam removed, one piece placed into wound bed. Pt had pain meds prior to procedure and tolerated with mod amt discomfort.  Immediate seal obtained at 169mm cont suction.  [facility] team will plan to change [address] MSN, RN, [place], [place], [place]

## 2019-09-10 NOTE — Progress Notes (Signed)
Physical Therapy Treatment Patient Details Name: [name] [name]. MRN: [id number] DOB: 13-Dec-1951 Today's Date: 09/10/2019    History of Present Illness Traveion Rotundo. is a 68 y.o. male with medical history significant of OSA on CPAP; perforated appy (2015); obesity; CAD s/p stents; HLD; DM; optic nerve neuritis; and afib on Pradaxa presenting with back pain.    PT Comments    Patient seen for mobility progression. Pt is making progress toward PT goals and tolerated gait training distance of 10 ft with use of RW. Pt sitting up in recliner end of session and RN aware of plan to sit up an hour. Continue to progress as tolerated.    Follow Up Recommendations  CIR     Equipment Recommendations  Other (comment)(TBD)    Recommendations for Other Services       Precautions / Restrictions Precautions Precautions: Fall Precaution Comments: wound vac L [place] joint  Restrictions Weight Bearing Restrictions: No    Mobility  Bed Mobility Overal bed mobility: Needs Assistance Bed Mobility: Rolling;Sidelying to Sit Rolling: Min guard Sidelying to sit: Min assist;+2 for physical assistance;HOB elevated       General bed mobility comments: use of rail; assistance needed to elevate trunk into sitting to decrease pain  Transfers Overall transfer level: Needs assistance Equipment used: Rolling walker (2 wheeled) Transfers: Sit to/from Stand Sit to Stand: Mod assist;+2 physical assistance;From elevated surface         General transfer comment: cues for hand placement and sequencing; assistance required to power up into standing   Ambulation/Gait Ambulation/Gait assistance: Min assist;+2 safety/equipment;Mod assist Gait Distance (Feet): 10 Feet Assistive device: Rolling walker (2 wheeled) Gait Pattern/deviations: Step-through pattern;Decreased stride length;Trunk flexed Gait velocity: decreased   General Gait Details: cues for breathing technique and attempting to relax  shoulders; assistance to manage RW as pt relies heavily in bilat UE support    Stairs             Wheelchair Mobility    Modified Rankin (Stroke Patients Only)       Balance Overall balance assessment: Needs assistance Sitting-balance support: Feet supported;Bilateral upper extremity supported Sitting balance-Leahy Scale: Fair     Standing balance support: Bilateral upper extremity supported;During functional activity Standing balance-Leahy Scale: Poor                              Cognition Arousal/Alertness: Awake/alert Behavior During Therapy: WFL for tasks assessed/performed Overall Cognitive Status: Within Functional Limits for tasks assessed                                        Exercises      General Comments        Pertinent Vitals/Pain Pain Assessment: Faces Faces Pain Scale: Hurts even more Pain Location: grossly low back pain but L shoulder painful with weigth bearing Pain Descriptors / Indicators: Grimacing;Guarding;Sore Pain Intervention(s): Limited activity within patient's tolerance;Monitored during session;Repositioned;Patient requesting pain meds-RN notified;Utilized relaxation techniques    Home Living                      Prior Function            PT Goals (current goals can now be found in the care plan section) Acute Rehab PT Goals Patient Stated Goal: to decrease pain, get up  to chair Progress towards PT goals: Progressing toward goals    Frequency    Min 3X/week      PT Plan Current plan remains appropriate    Co-evaluation              AM-PAC PT "6 Clicks" Mobility   Outcome Measure  Help needed turning from your back to your side while in a flat bed without using bedrails?: A Lot Help needed moving from lying on your back to sitting on the side of a flat bed without using bedrails?: A Lot Help needed moving to and from a bed to a chair (including a wheelchair)?: A Lot Help  needed standing up from a chair using your arms (e.g., wheelchair or bedside chair)?: A Lot Help needed to walk in hospital room?: A Lot Help needed climbing 3-5 steps with a railing? : Total 6 Click Score: 11    End of Session Equipment Utilized During Treatment: Gait belt Activity Tolerance: Patient limited by pain Patient left: with call bell/phone within reach;in chair;with family/visitor present;with nursing/sitter in room Nurse Communication: Mobility status PT Visit Diagnosis: Muscle weakness (generalized) (M62.81);Other abnormalities of gait and mobility (R26.89);Difficulty in walking, not elsewhere classified (R26.2);Pain Pain - Right/Left: Left Pain - part of body: Shoulder(back)     Time: [id number] PT Time Calculation (min) (ACUTE ONLY): 31 min  Charges:  $Gait Training: 23-37 mins                     Earney Navy, PTA Acute Rehabilitation Services Pager: [phone] Office: [phone]     Darliss Cheney 09/10/2019, 2:44 PM

## 2019-09-10 NOTE — TOC Transition Note (Signed)
Transition of Care [facility]) - CM/SW Discharge Note   Patient Details  Name: [name] [name]. MRN: [id number] Date of Birth: 20-Mar-1951  [facility]) CM/SW Contact:  Benard Halsted, LCSW Phone Number: 09/10/2019, 11:41 AM   Clinical Narrative:    CSW received consult for possible SNF placement at time of discharge since CIR is likely not able to accept. CSW spoke with patient regarding PT recommendation of SNF placement at time of discharge. Patient reported that patient's spouse is currently unable to care for patient at their home given patient's current physical needs and fall risk. Patient expressed understanding of PT recommendation and is agreeable to SNF placement at time of discharge. CSW discussed insurance authorization process. Patient expressed being hopeful for rehab and to feel better soon. He inquired if he could be reconsidered for CIR if he improved over the weekend. CSW let him know that we can re-consult them if he is able to tolerate more therapy but that we would go ahead and proceed with SNF in the meantime. Patient is agreeable. No further questions reported at this time. CSW to continue to follow and assist with discharge planning needs.   Final next level of care: Skilled Nursing Facility Barriers to Discharge: Continued Medical Work up, Ship broker   Patient Goals and CMS Choice Patient states their goals for this hospitalization and ongoing recovery are:: Return home and get stronger CMS Medicare.gov Compare Post Acute Care list provided to:: Patient Choice offered to / list presented to : Patient  Discharge Placement                       Discharge Plan and Services In-house Referral: Clinical Social Work Discharge Planning Services: NA Post Acute Care Choice: [place]          DME Arranged: N/A DME Agency: NA       HH Arranged: NA HH Agency: NA        Social Determinants of Health (SDOH) Interventions     Readmission Risk Interventions No flowsheet data found.

## 2019-09-10 NOTE — Progress Notes (Signed)
Patient ID: [name] Onken., male   DOB: 1951-08-29, 68 y.o.   MRN: [id number]  PROGRESS NOTE    [name] Chimes.  [id number] DOB: 1951/01/27 DOA: 08/27/2019 PCP: Burnis Medin, MD   Brief Narrative:  68 year old male with history of OSA on CPAP, perforated appendicitis in 2015, CAD status post stents, hyperlipidemia, diabetes mellitus type 2, optic neuritis with recent hospitalization at [facility] treated with intravenous and subsequent oral steroids, recent right lower extremity cellulitis treated as an outpatient with oral doxycycline, A. fib on Pradaxa presented with back pain and progressively worsening difficulty walking.  Neurology was consulted.  He was found to have MSSA bacteremia with question of L4-L5 discitis/osteomyelitis.  He was started on Ancef.  ID was consulted.  Subsequently, he was found to have infected left sternoclavicular joint.  CT surgery was consulted.  He underwent I&D with application of wound VAC by CT surgery.  ID recommends nafcillin for 14 days till 09/15/2019 then change to cefazolin on 09/16/2019 through 10/12/2019.  Assessment & Plan:  MSSA bacteremia Question of L4-L5 discitis/osteomyelitis -Repeat blood cultures from 08/29/2019 have been negative so far. Echo showed no valvular vegetations.  Spoke to neurosurgery/Dr. Annette Stable on phone on 08/28/2019 and he reviewed the MRI lumbar spine and recommended no surgical intervention at this time. -ID recommended nafcillin for 14 days till 09/15/2019 then changed to cefazolin on 09/16/2019 through 10/12/2019. -Currently afebrile and hemodynamically stable.  Infected left sternoclavicular joint status post I&D with application of wound VAC by CT surgery -Surgical/deep tissue culture grew staph aureus -CT surgery following.  Wound and wound VAC care as per CT surgery recommendations.  Antibiotic plan as above.  Left shoulder pain -Improving. 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   -MRI of the cervical and thoracic spine showed degenerative changes without cord signal abnormality -MRI of the brain showed question of punctate acute infarctions.    Neurology was not convinced about this for now.  CTA of the head and neck showed no evidence of CNS vasculitis -PT/OT recommends CIR. patient is still very slow to progress with PT. CIR thinks that patient might benefit from SNF placement.  Consult social worker. -Neurology has signed off.  Leukocytosis -Resolved.  Thrombocytopenia -Questionable cause.    Resolved.  Dehydration Acute kidney injury -Resolved.  Off IV fluids.  Paroxysmal A. fib on Pradaxa -Continue Tikosyn.  Check potassium and magnesium daily. -Continue Pradaxa.  Diabetes mellitus type 2 -A1c 6.7 on 07/21/2019 -Glucophage held -Continue CBGs with SSI  Hyperlipidemia -Continue Lipitor  OSA -Continue CPAP at bedside  Obesity -Outpatient follow-up  Generalized deconditioning/weakness-PT recommended CIR placement.  CIR thinks that patient might benefit from SNF placement.  DVT prophylaxis:  Pradaxa Code Status: Full Family Communication:  None at bedside  disposition Plan:  SNF versus CIR once bed is available.  Consultants: Neurology/ID/IR/CT surgery/orthopedics.  Spoke to neurosurgery/Dr. Annette Stable on 08/28/2019.  Procedures:  Echo IMPRESSIONS   1. The left ventricle has normal systolic function with an ejection fraction of 60-65%. The cavity size was normal. There is mildly increased left ventricular wall thickness. Left ventricular  diastolic Doppler parameters are consistent with impaired  relaxation. Indeterminate filling pressures  The E/e' is 8-15. No evidence of left ventricular regional wall motion abnormalities. 2. The right ventricle has normal systolic function. The cavity was normal. There is no increase in right ventricular wall thickness. 3. Left atrial size was mildly dilated. 4. The mitral valve is abnormal. Mild thickening of the mitral valve leaflet. No mitral valve vegetation visualized. 5. The tricuspid valve is grossly normal. 6. No vegetation on the aortic valve. 7. The aortic valve is tricuspid. Mild sclerosis of the aortic valve. Aortic valve regurgitation is trivial by color flow Doppler. No stenosis of the aortic valve. 8. The aorta is normal unless otherwise noted.  SUMMARY  LVEF 60-65%, mild LVH, normal wall motion, grade 1 DD, indeterminate LV filling pressure, normal RV function, mild LAE, aortic valve sclerosis with trivial AI, no valvular vegetations  Antimicrobials:  Anti-infectives (From admission, onward)   Start     Dose/Rate Route Frequency Ordered Stop   09/16/19 0600  ceFAZolin (ANCEF) IVPB 2g/100 mL premix     2 g 200 mL/hr over 30 Minutes Intravenous Every 8 hours 09/02/19 1102     08/31/19 2000  nafcillin 2 g in sodium chloride 0.9 % 100 mL IVPB     2 g 200 mL/hr over 30 Minutes Intravenous Every 4 hours 08/31/19 1344 09/15/19 2359   08/31/19 1300  amoxicillin (AMOXIL) capsule 500 mg     500 mg Oral  Once 08/31/19 1117 08/31/19 1227   08/31/19 1258  ceFAZolin (ANCEF) IVPB 2g/100 mL premix  Status:  Discontinued     2 g 200 mL/hr over 30 Minutes Intravenous 30 min pre-op 08/31/19 1258 08/31/19 1713   08/28/19 0300  ceFAZolin (ANCEF) IVPB 2g/100 mL premix  Status:  Discontinued     2 g 200 mL/hr over 30 Minutes Intravenous Every 8 hours 08/28/19 0225 08/31/19 1117       Subjective: Patient seen and examined at bedside.  Denies worsening back pain, fever,  abdominal pain or diarrhea.  Still feels weak. Objective: Vitals:   09/09/19 0844 09/09/19 1356 09/09/19 2122 09/10/19 0632  BP:  112/61 (!) 113/56 118/72  Pulse:  84 95 83  Resp: 18 17 18 19   Temp:  98.2 F (36.8 C) 98.1 F (36.7 C) 98.4 F (36.9 C)  TempSrc:   Oral   SpO2:  92% 94% 92%  Weight:      Height:        Intake/Output Summary (Last 24 hours) at 09/10/2019 0805 Last data filed at 09/10/2019 0500 Gross per 24 hour  Intake 1790.06 ml  Output 1805 ml  Net -14.94 ml   Filed Weights   08/29/19 0500 08/30/19 0416  Weight: 118 kg 117.9 kg    Examination:  General exam: No distress.  Looks chronically ill. Respiratory system: Bilateral decreased breath sounds at bases with no wheezing.  Some crackles  cardiovascular system: Rate controlled, S1-S2 heard Gastrointestinal system: Abdomen is nondistended, soft and nontender. Normal bowel sounds heard. Extremities: No cyanosis; trace edema   Data Reviewed: I have personally reviewed following labs and imaging studies  CBC: Recent Labs  Lab 09/04/19 0343 09/08/19 0447 09/09/19 0332  WBC 10.4 11.1* 10.9*  NEUTROABS 7.3 8.3* 8.2*  HGB 14.5 15.1 14.9  HCT 43.4 44.8 44.7  MCV 91.8 91.1 91.4  PLT 153 194 [id number]   Basic Metabolic Panel: Recent Labs  Lab 09/06/19 0340 09/07/19 0350 09/08/19 0447 09/09/19 0332 09/10/19 0423  NA 138 134* 134* 135 135  K 3.6 4.4 3.9 3.7 4.3  CL 101 100 97* 99 100  CO2 27 24 26 27 25   GLUCOSE 132* 115* 149* 141* 135*  BUN 13 10 15 14 13   CREATININE 0.91 0.80 0.93 0.90 0.90  CALCIUM 9.0 9.1 9.4 9.4 9.5  MG 1.8 1.8 1.8 1.8 1.8   GFR: Estimated Creatinine Clearance: 104.2 mL/min (by C-G formula based on SCr of 0.9 mg/dL). Liver Function Tests: No results for input(s): AST, ALT, ALKPHOS, BILITOT, PROT, ALBUMIN in the last 168 hours. No results for input(s): LIPASE, AMYLASE in the last 168 hours. No results for input(s): AMMONIA in the last 168 hours. Coagulation Profile: No  results for input(s): INR, PROTIME in the last 168 hours. Cardiac Enzymes: No results for input(s): CKTOTAL, CKMB, CKMBINDEX, TROPONINI in the last 168 hours. BNP (last 3 results) No results for input(s): PROBNP in the last 8760 hours. HbA1C: No results for input(s): HGBA1C in the last 72 hours. CBG: Recent Labs  Lab 09/08/19 2140 09/09/19 0816 09/09/19 1236 09/09/19 1659 09/09/19 2119  GLUCAP 162* 147* 144* 101* 136*   Lipid Profile: No results for input(s): CHOL, HDL, LDLCALC, TRIG, CHOLHDL, LDLDIRECT in the last 72 hours. Thyroid Function Tests: No results for input(s): TSH, T4TOTAL, FREET4, T3FREE, THYROIDAB in the last 72 hours. Anemia Panel: No results for input(s): VITAMINB12, FOLATE, FERRITIN, TIBC, IRON, RETICCTPCT in the last 72 hours. Sepsis Labs: No results for input(s): PROCALCITON, LATICACIDVEN in the last 168 hours.  Recent Results (from the past 240 hour(s))  Surgical PCR screen     Status: None   Collection Time: 08/31/19  1:47 PM   Specimen: Nasal Mucosa; Nasal Swab  Result Value Ref Range Status   MRSA, PCR NEGATIVE NEGATIVE Final   Staphylococcus aureus NEGATIVE NEGATIVE Final    Comment: (NOTE) The Xpert SA Assay (FDA approved for NASAL specimens in patients 50 years of age and older), is one component of a comprehensive surveillance program. It is not intended to diagnose infection nor to guide or monitor treatment. Performed at [facility], [place] [address] [place], [place] [address]   Fungus Culture With Stain     Status: None (Preliminary result)   Collection Time: 08/31/19  3:24 PM   Specimen: Other Source; Body Fluid  Result Value Ref Range Status   Fungus Stain Final report  Final    Comment: (NOTE) Performed At: [facility] [place], [place] [id number] Rush Farmer MD [id number]    Fungus (Mycology) Culture PENDING  Incomplete   Fungal Source JOINT FLUID  Final    Comment: STERNOCLAVICULAR Performed  at [facility], [place] [address] [place], [place] [address]   Aerobic/Anaerobic Culture (surgical/deep wound)     Status: None   Collection Time: 08/31/19  3:24 PM   Specimen: Other Source; Body Fluid  Result Value Ref Range Status   Specimen Description JOINT FLUID  Final   Special Requests LEFT STERNOCLAVICULAR  Final   Gram Stain   Final    ABUNDANT WBC PRESENT,BOTH PMN AND MONONUCLEAR FEW GRAM POSITIVE COCCI    Culture   Final    FEW STAPHYLOCOCCUS AUREUS NO ANAEROBES ISOLATED Performed at [facility], [place] [address], [place], [place] [address]    Report Status 09/05/2019 FINAL  Final   Organism ID, Bacteria STAPHYLOCOCCUS AUREUS  Final      Susceptibility   Staphylococcus aureus - MIC*    CIPROFLOXACIN <=0.5 SENSITIVE Sensitive     ERYTHROMYCIN <=0.25 SENSITIVE Sensitive  GENTAMICIN <=0.5 SENSITIVE Sensitive     OXACILLIN <=0.25 SENSITIVE Sensitive     TETRACYCLINE <=1 SENSITIVE Sensitive     VANCOMYCIN 1 SENSITIVE Sensitive     TRIMETH/SULFA <=10 SENSITIVE Sensitive     CLINDAMYCIN <=0.25 SENSITIVE Sensitive     RIFAMPIN <=0.5 SENSITIVE Sensitive     Inducible Clindamycin NEGATIVE Sensitive     * FEW STAPHYLOCOCCUS AUREUS  Acid Fast Smear (AFB)     Status: None   Collection Time: 08/31/19  3:24 PM   Specimen: Other Source; Body Fluid  Result Value Ref Range Status   AFB Specimen Processing Concentration  Final   Acid Fast Smear Negative  Final    Comment: (NOTE) Performed At: [facility] [place], [place] [id number] Rush Farmer MD [id number]    Source (AFB) JOINT FLUID  Final    Comment: STERNOCLAVICULAR Performed at [facility], [place] [address] [place], [place] [address]   Fungus Culture Result     Status: None   Collection Time: 08/31/19  3:24 PM  Result Value Ref Range Status   Result 1 Comment  Final    Comment: (NOTE) KOH/Calcofluor preparation:  no fungus observed. Performed At: [facility] [place], [place] [id number] Rush Farmer MD [id number]          Radiology Studies: No results found.      Scheduled Meds: . atorvastatin  80 mg Oral QHS  . brimonidine  1 drop Both Eyes TID  . dabigatran  150 mg Oral Q12H  . dofetilide  500 mcg Oral BID  . fluticasone furoate-vilanterol  1 puff Inhalation Daily  . insulin aspart  0-15 Units Subcutaneous TID WC  . insulin aspart  0-5 Units Subcutaneous QHS  . loratadine  10 mg Oral Daily  . multivitamin with minerals  1 tablet Oral Daily  . pantoprazole  40 mg Oral Daily  . polyethylene glycol  17 g Oral BID  . potassium chloride  40 mEq Oral BID  . Ensure Max Protein  11 oz Oral BID  . senna-docusate  1 tablet Oral BID  . sodium chloride flush  10-40 mL Intracatheter Q12H   Continuous Infusions: . [START ON 09/16/2019]  ceFAZolin (ANCEF) IV    . nafcillin IV 2 g (09/10/19 0406)     LOS: 14 days        [name] August, MD [facility] 09/10/2019, 8:05 AM

## 2019-09-11 LAB — GLUCOSE, CAPILLARY
Glucose-Capillary: 149 mg/dL — ABNORMAL HIGH (ref 70–99)
Glucose-Capillary: 167 mg/dL — ABNORMAL HIGH (ref 70–99)
Glucose-Capillary: 134 mg/dL — ABNORMAL HIGH (ref 70–99)
Glucose-Capillary: 119 mg/dL — ABNORMAL HIGH (ref 70–99)
Glucose-Capillary: 144 mg/dL — ABNORMAL HIGH (ref 70–99)

## 2019-09-11 LAB — BASIC METABOLIC PANEL
Anion gap: 10 (ref 5–15)
BUN: 15 mg/dL (ref 8–23)
CO2: 25 mmol/L (ref 22–32)
Calcium: 9.5 mg/dL (ref 8.9–10.3)
Chloride: 100 mmol/L (ref 98–111)
Creatinine, Ser: 0.89 mg/dL (ref 0.61–1.24)
GFR calc Af Amer: >60 mL/min (ref >60)
GFR calc non Af Amer: >60 mL/min (ref >60)
Glucose, Bld: 136 mg/dL — ABNORMAL HIGH (ref 70–99)
Potassium: 4.4 mmol/L (ref 3.5–5.1)
Sodium: 135 mmol/L (ref 135–145)

## 2019-09-11 LAB — MAGNESIUM: Magnesium: 1.8 mg/dL (ref 1.7–2.4)

## 2019-09-11 NOTE — Progress Notes (Addendum)
      [address]       [place] [address]             [phone]        11 Days Post-Op Procedure(s) (LRB): LEFT STERNOCLAVICULAR WOUND DEBRIDEMENT WITH APPLICATION OF WOUND VAC (Left)  Subjective: Patient still with back spasms. He is tearful this am as he has not seen his wife in 2 weeks.  Objective: Vital signs in last 24 hours: Temp:  [97.5 F (36.4 C)-98 F (36.7 C)] 98 F (36.7 C) (09/12 0523) Pulse Rate:  [69-98] 69 (09/12 0523) Resp:  [18-19] 19 (09/12 0523) BP: (121-122)/(66-79) 122/67 (09/12 0523) SpO2:  [91 %-95 %] 95 % (09/12 0912) FiO2 (%):  [21 %] 21 % (09/12 0912)   Current Weight  08/30/19 117.9 kg      Intake/Output from previous day: 09/11 0701 - 09/12 0700 In: 1200.1 [P.O.:600; IV Piggyback:600.1] Out: 1300 [Urine:1300]   Physical Exam:  Cardiovascular: RRR Pulmonary: Clear to auscultation bilaterally Wounds: VAC in place on left [place] joint.  Lab Results: CBC: Recent Labs    09/09/19 0332  WBC 10.9*  HGB 14.9  HCT 44.7  PLT 187   BMET:  Recent Labs    09/10/19 0423 09/11/19 0400  NA 135 135  K 4.3 4.4  CL 100 100  CO2 25 25  GLUCOSE 135* 136*  BUN 13 15  CREATININE 0.90 0.89  CALCIUM 9.5 9.5    PT/INR:  Lab Results  Component Value Date   INR 1.2 08/31/2019   INR 1.1 08/27/2019   INR 1.31 12/16/2016   PROTIME 20.7 06/13/2009   ABG:  INR: Will add last result for INR, ABG once components are confirmed Will add last 4 CBG results once components are confirmed  Assessment/Plan: 1. CV-History of PAF. On Tikosyn 500 mcg bid and Dabigatran 150 mg bid 1. S/p I and D of left [place] joint-wound VAC will be changed on Monday 2. ID-on Cefazolin and Nafcillin for MSSA bacteremia. 3. I spoke with nurse and son has visited him. Patient only allowed one visitor during hospital stay secondary to [place]. 4. Management per primary  Donielle M ZimmermanPA-C 09/11/2019,9:23 AM [id number]   Chart reviewed,  patient examined, agree with above. Continue VAC and antibiotics

## 2019-09-11 NOTE — Progress Notes (Signed)
Patient ID: [name] [name]., male   DOB: 01/11/1951, 68 y.o.   MRN: [id number]  PROGRESS NOTE    [name] [name].  [id number] DOB: 15-Apr-1951 DOA: 08/27/2019 PCP: Burnis Medin, MD   Brief Narrative:  68 year old male with history of OSA on CPAP, perforated appendicitis in 2015, CAD status post stents, hyperlipidemia, diabetes mellitus type 2, optic neuritis with recent hospitalization at [facility] treated with intravenous and subsequent oral steroids, recent right lower extremity cellulitis treated as an outpatient with oral doxycycline, A. fib on Pradaxa presented with back pain and progressively worsening difficulty walking.  Neurology was consulted.  He was found to have MSSA bacteremia with question of L4-L5 discitis/osteomyelitis.  He was started on Ancef.  ID was consulted.  Subsequently, he was found to have infected left sternoclavicular joint.  CT surgery was consulted.  He underwent I&D with application of wound VAC by CT surgery.  ID recommends nafcillin for 14 days till 09/15/2019 then change to cefazolin on 09/16/2019 through 10/12/2019.  Assessment & Plan:  MSSA bacteremia Question of L4-L5 discitis/osteomyelitis -Repeat blood cultures from 08/29/2019 have been negative so far. Echo showed no valvular vegetations.  Spoke to neurosurgery/Dr. Annette Stable on phone on 08/28/2019 and he reviewed the MRI lumbar spine and recommended no surgical intervention at this time. -ID recommended nafcillin for 14 days till 09/15/2019 then changed to cefazolin on 09/16/2019 through 10/12/2019. -Currently afebrile and hemodynamically stable.  Infected left sternoclavicular joint status post I&D with application of wound VAC by CT surgery -Surgical/deep tissue culture grew staph aureus -CT surgery following.  Wound and wound VAC care as per CT surgery recommendations.  Antibiotic plan as above.  Left shoulder pain -Improving. 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   -MRI of the cervical and thoracic spine showed degenerative changes without cord signal abnormality -MRI of the brain showed question of punctate acute infarctions.    Neurology was not convinced about this for now.  CTA of the head and neck showed no evidence of CNS vasculitis -PT/OT recommends CIR. patient is still very slow to progress with PT. CIR thinks that patient might benefit from SNF placement.  Consult social worker. -Neurology has signed off.  Leukocytosis -Resolved.  Thrombocytopenia -Questionable cause.    Resolved.  Dehydration Acute kidney injury -Resolved.  Off IV fluids.  Paroxysmal A. fib on Pradaxa -Continue Tikosyn.  Check potassium and magnesium daily. -Continue Pradaxa.  Diabetes mellitus type 2 -A1c 6.7 on 07/21/2019 -Glucophage held -Continue CBGs with SSI  Hyperlipidemia -Continue Lipitor  OSA -Continue CPAP at bedside  Obesity -Outpatient follow-up  Generalized deconditioning/weakness-PT recommended CIR placement.  CIR thinks that patient might benefit from SNF placement.  DVT prophylaxis:  Pradaxa Code Status: Full Family Communication:  None at bedside  disposition Plan:  SNF versus CIR once bed is available.  Consultants: Neurology/ID/IR/CT surgery/orthopedics.  Spoke to neurosurgery/Dr. Annette Stable on 08/28/2019.  Procedures:  Echo IMPRESSIONS   1. The left ventricle has normal systolic function with an ejection fraction of 60-65%. The cavity size was normal. There is mildly increased left ventricular wall thickness. Left ventricular  diastolic Doppler parameters are consistent with impaired  relaxation. Indeterminate filling pressures  The E/e' is 8-15. No evidence of left ventricular regional wall motion abnormalities. 2. The right ventricle has normal systolic function. The cavity was normal. There is no increase in right ventricular wall thickness. 3. Left atrial size was mildly dilated. 4. The mitral valve is abnormal. Mild thickening of the mitral valve leaflet. No mitral valve vegetation visualized. 5. The tricuspid valve is grossly normal. 6. No vegetation on the aortic valve. 7. The aortic valve is tricuspid. Mild sclerosis of the aortic valve. Aortic valve regurgitation is trivial by color flow Doppler. No stenosis of the aortic valve. 8. The aorta is normal unless otherwise noted.  SUMMARY  LVEF 60-65%, mild LVH, normal wall motion, grade 1 DD, indeterminate LV filling pressure, normal RV function, mild LAE, aortic valve sclerosis with trivial AI, no valvular vegetations  Antimicrobials:  Anti-infectives (From admission, onward)   Start     Dose/Rate Route Frequency Ordered Stop   09/16/19 0600  ceFAZolin (ANCEF) IVPB 2g/100 mL premix     2 g 200 mL/hr over 30 Minutes Intravenous Every 8 hours 09/02/19 1102     08/31/19 2000  nafcillin 2 g in sodium chloride 0.9 % 100 mL IVPB     2 g 200 mL/hr over 30 Minutes Intravenous Every 4 hours 08/31/19 1344 09/15/19 2359   08/31/19 1300  amoxicillin (AMOXIL) capsule 500 mg     500 mg Oral  Once 08/31/19 1117 08/31/19 1227   08/31/19 1258  ceFAZolin (ANCEF) IVPB 2g/100 mL premix  Status:  Discontinued     2 g 200 mL/hr over 30 Minutes Intravenous 30 min pre-op 08/31/19 1258 08/31/19 1713   08/28/19 0300  ceFAZolin (ANCEF) IVPB 2g/100 mL premix  Status:  Discontinued     2 g 200 mL/hr over 30 Minutes Intravenous Every 8 hours 08/28/19 0225 08/31/19 1117      Subjective: Patient seen and examined at bedside.  Denies any overnight vomiting, fever,  shortness of breath.  Still feels weak.  States that he sat for 52minutes yesterday on the chair.    Objective: Vitals:   09/10/19 0821 09/10/19 1418 09/10/19 2202 09/11/19 0523  BP:  121/79 121/66 122/67  Pulse:  98 82 69  Resp:  18 19 19   Temp:  (!) 97.5 F (36.4 C) 98 F (36.7 C) 98 F (36.7 C)  TempSrc:      SpO2: 93% 93% 91% 94%  Weight:      Height:        Intake/Output Summary (Last 24 hours) at 09/11/2019 0819 Last data filed at 09/11/2019 0530 Gross per 24 hour  Intake 1200.09 ml  Output 1300 ml  Net -99.91 ml   Filed Weights   08/29/19 0500 08/30/19 0416  Weight: 118 kg 117.9 kg    Examination:  General exam: No acute distress.  Looks chronically ill. Respiratory system: Bilateral decreased breath sounds at bases with some scattered crackles  cardiovascular system: S1-S2 heard, rate controlled Gastrointestinal system: Abdomen is nondistended, soft and nontender. Normal bowel sounds heard. Extremities: No cyanosis; trace edema   Data Reviewed: I have personally reviewed following labs and imaging studies  CBC: Recent Labs  Lab 09/08/19 0447 09/09/19 0332  WBC 11.1* 10.9*  NEUTROABS 8.3* 8.2*  HGB 15.1 14.9  HCT 44.8 44.7  MCV 91.1 91.4  PLT 194 123XX123   Basic Metabolic Panel: Recent Labs  Lab 09/07/19 0350 09/08/19 0447 09/09/19 0332 09/10/19 0423 09/11/19 0400  NA 134* 134* 135 135 135  K 4.4 3.9  3.7 4.3 4.4  CL 100 97* 99 100 100  CO2 24 26 27 25 25   GLUCOSE 115* 149* 141* 135* 136*  BUN 10 15 14 13 15   CREATININE 0.80 0.93 0.90 0.90 0.89  CALCIUM 9.1 9.4 9.4 9.5 9.5  MG 1.8 1.8 1.8 1.8 1.8   GFR: Estimated Creatinine Clearance: 105.4 mL/min (by C-G formula based on SCr of 0.89 mg/dL). Liver Function Tests: No results for input(s): AST, ALT, ALKPHOS, BILITOT, PROT, ALBUMIN in the last 168 hours. No results for input(s): LIPASE, AMYLASE in the last 168 hours. No results for input(s): AMMONIA in the last 168 hours. Coagulation  Profile: No results for input(s): INR, PROTIME in the last 168 hours. Cardiac Enzymes: No results for input(s): CKTOTAL, CKMB, CKMBINDEX, TROPONINI in the last 168 hours. BNP (last 3 results) No results for input(s): PROBNP in the last 8760 hours. HbA1C: No results for input(s): HGBA1C in the last 72 hours. CBG: Recent Labs  Lab 09/10/19 0807 09/10/19 1208 09/10/19 1638 09/10/19 2202 09/11/19 0743  GLUCAP 145* 138* 164* 141* 149*   Lipid Profile: No results for input(s): CHOL, HDL, LDLCALC, TRIG, CHOLHDL, LDLDIRECT in the last 72 hours. Thyroid Function Tests: No results for input(s): TSH, T4TOTAL, FREET4, T3FREE, THYROIDAB in the last 72 hours. Anemia Panel: No results for input(s): VITAMINB12, FOLATE, FERRITIN, TIBC, IRON, RETICCTPCT in the last 72 hours. Sepsis Labs: No results for input(s): PROCALCITON, LATICACIDVEN in the last 168 hours.  No results found for this or any previous visit (from the past 240 hour(s)).       Radiology Studies: No results found.      Scheduled Meds: . atorvastatin  80 mg Oral QHS  . brimonidine  1 drop Both Eyes TID  . dabigatran  150 mg Oral Q12H  . dofetilide  500 mcg Oral BID  . fluticasone furoate-vilanterol  1 puff Inhalation Daily  . insulin aspart  0-15 Units Subcutaneous TID WC  . insulin aspart  0-5 Units Subcutaneous QHS  . loratadine  10 mg Oral Daily  . multivitamin with minerals  1 tablet Oral Daily  . pantoprazole  40 mg Oral Daily  . polyethylene glycol  17 g Oral BID  . potassium chloride  40 mEq Oral BID  . Ensure Max Protein  11 oz Oral BID  . senna-docusate  1 tablet Oral BID  . sodium chloride flush  10-40 mL Intracatheter Q12H   Continuous Infusions: . [START ON 09/16/2019]  ceFAZolin (ANCEF) IV    . nafcillin IV 2 g (09/11/19 0530)     LOS: 15 days        Aline August, MD [facility] 09/11/2019, 8:19 AM

## 2019-09-11 NOTE — Progress Notes (Signed)
Occupational Therapy Treatment Patient Details Name: [name] [name]. MRN: [id number] DOB: 07/15/1951 Today's Date: 09/11/2019    History of present illness [name] [name]. is a 68 y.o. male with medical history significant of OSA on CPAP; perforated appy (2015); obesity; CAD s/p stents; HLD; DM; optic nerve neuritis; and afib on Pradaxa presenting with back pain.   OT comments  Pt. Was willing to sit EOB. Pt. Required Min guard assist and cues for log roll to sit EOB. Pt. Was Mod A with supine to sit and sit to supine EOB. Pt. Now has a back brace and was ed on don/doff of brace but needed total assist to don secondary to Pt. Needed B UE to brace self in sitting. Pt. Had to have support at [facility] if he would move alternating UE. Pt. Was able to sit EOB for 20 min. Pt. Stated that the brace was helping with the back pain.   Follow Up Recommendations       Equipment Recommendations       Recommendations for Other Services      Precautions / Restrictions Precautions Precautions: Fall Precaution Comments: wound vac L [place] joint  Required Braces or Orthoses: Spinal Brace Restrictions Weight Bearing Restrictions: No Other Position/Activity Restrictions: wound vac       Mobility Bed Mobility     Rolling: Min guard Sidelying to sit: Mod assist   Sit to supine: Mod assist   General bed mobility comments: Pt. ed on log rolling to decrease pain.   Transfers                      Balance     Sitting balance-Leahy Scale: Fair Sitting balance - Comments: pt. using b ue and need support for shld if lifted arm.                                   ADL either performed or assessed with clinical judgement   ADL                                               Vision       Perception     Praxis      Cognition Arousal/Alertness: Awake/alert Behavior During Therapy: WFL for tasks assessed/performed Overall Cognitive Status: Within  Functional Limits for tasks assessed                                 General Comments: Pt. motivated with therapy        Exercises     Shoulder Instructions       General Comments      Pertinent Vitals/ Pain       Pain Assessment: 0-10 Pain Score: 8  Pain Intervention(s): Limited activity within patient's tolerance  Home Living                                          Prior Functioning/Environment              Frequency           Progress Toward Goals  OT Goals(current goals can now be found in the care plan section)  Progress towards OT goals: Progressing toward goals  Acute Rehab OT Goals Patient Stated Goal: to be able to take care of myself  Plan Discharge plan remains appropriate;Frequency remains appropriate    Co-evaluation                 AM-PAC OT "6 Clicks" Daily Activity     Outcome Measure   Help from another person eating meals?: None Help from another person taking care of personal grooming?: A Little Help from another person toileting, which includes using toliet, bedpan, or urinal?: Total Help from another person bathing (including washing, rinsing, drying)?: A Lot Help from another person to put on and taking off regular upper body clothing?: A Lot Help from another person to put on and taking off regular lower body clothing?: Total 6 Click Score: 13    End of Session        Activity Tolerance Patient limited by pain   Patient Left in bed;with call bell/phone within reach;with bed alarm set   Nurse Communication (ok therapy)        Time: [id number] OT Time Calculation (min): 32 min  Charges: OT General Charges $OT Visit: 1 Visit OT Treatments $Therapeutic Activity: [id number] mins  6 clicks   Jaidee Stipe 09/11/2019, 12:49 PM

## 2019-09-12 LAB — BASIC METABOLIC PANEL
Anion gap: 11 (ref 5–15)
BUN: 14 mg/dL (ref 8–23)
CO2: 25 mmol/L (ref 22–32)
Calcium: 9.6 mg/dL (ref 8.9–10.3)
Chloride: 100 mmol/L (ref 98–111)
Creatinine, Ser: 0.96 mg/dL (ref 0.61–1.24)
GFR calc Af Amer: >60 mL/min (ref >60)
GFR calc non Af Amer: >60 mL/min (ref >60)
Glucose, Bld: 137 mg/dL — ABNORMAL HIGH (ref 70–99)
Potassium: 4.4 mmol/L (ref 3.5–5.1)
Sodium: 136 mmol/L (ref 135–145)

## 2019-09-12 LAB — GLUCOSE, CAPILLARY
Glucose-Capillary: 191 mg/dL — ABNORMAL HIGH (ref 70–99)
Glucose-Capillary: 124 mg/dL — ABNORMAL HIGH (ref 70–99)
Glucose-Capillary: 138 mg/dL — ABNORMAL HIGH (ref 70–99)
Glucose-Capillary: 172 mg/dL — ABNORMAL HIGH (ref 70–99)

## 2019-09-12 LAB — MAGNESIUM: Magnesium: 1.9 mg/dL (ref 1.7–2.4)

## 2019-09-12 MED ORDER — ALPRAZOLAM 0.5 MG PO TABS
0.5000 mg | ORAL_TABLET | Freq: Three times a day (TID) | ORAL | Status: DC | PRN
  Administered 2019-09-12: 0.5 mg via ORAL
  Filled 2019-09-12: qty 1

## 2019-09-12 NOTE — Progress Notes (Addendum)
      [place]       [employer] [id number]             [phone]        12 Days Post-Op Procedure(s) (LRB): LEFT STERNOCLAVICULAR WOUND DEBRIDEMENT WITH APPLICATION OF WOUND VAC (Left)  Subjective: Patient still with back spasms, which this along with pain limits his mobility  Objective: Vital signs in last 24 hours: Temp:  [97.7 F (36.5 C)-98.6 F (37 C)] 98.6 F (37 C) (09/13 0618) Pulse Rate:  [78-84] 84 (09/13 0618) Resp:  [16-20] 16 (09/13 0618) BP: (113-128)/(66-70) 113/70 (09/13 0618) SpO2:  [90 %-94 %] 91 % (09/13 0834) FiO2 (%):  [21 %] 21 % (09/13 0834)   Current Weight  08/30/19 117.9 kg      Intake/Output from previous day: 09/12 0701 - 09/13 0700 In: 920 [P.O.:320; IV Piggyback:600] Out: 550 [Urine:550]   Physical Exam:  Cardiovascular: RRR Pulmonary: Clear to auscultation bilaterally Wounds: VAC in place on left [place] joint. Light bloody output in cannister  Lab Results: CBC: No results for input(s): WBC, HGB, HCT, PLT in the last 72 hours. BMET:  Recent Labs    09/11/19 0400 09/12/19 0411  NA 135 136  K 4.4 4.4  CL 100 100  CO2 25 25  GLUCOSE 136* 137*  BUN 15 14  CREATININE 0.89 0.96  CALCIUM 9.5 9.6    PT/INR:  Lab Results  Component Value Date   INR 1.2 08/31/2019   INR 1.1 08/27/2019   INR 1.31 12/16/2016   PROTIME 20.7 06/13/2009   ABG:  INR: Will add last result for INR, ABG once components are confirmed Will add last 4 CBG results once components are confirmed  Assessment/Plan: 1. CV-History of PAF. On Tikosyn 500 mcg bid and Dabigatran 150 mg bid 1. S/p I and D of left [place] joint-wound VAC will be changed on Monday 2. ID-on Cefazolin and Nafcillin for MSSA bacteremia. 3. I spoke with nurse and son has visited him. Patient only allowed one visitor during hospital stay secondary to [place]. 4. Continue with PT/OT;Management per primary  Donielle M ZimmermanPA-C 09/12/2019,9:24 AM [id number]   Chart reviewed, patient examined, agree with above. Stable, no changes. Plan VAC change Monday.

## 2019-09-12 NOTE — Progress Notes (Signed)
Patient ID: [name] [name]., male   DOB: 06-01-1951, 68 y.o.   MRN: [id number]  PROGRESS NOTE    [name].  [id number] DOB: 26-Jul-1951 DOA: 08/27/2019 PCP: Burnis Medin, MD   Brief Narrative:  68 year old male with history of OSA on CPAP, perforated appendicitis in 2015, CAD status post stents, hyperlipidemia, diabetes mellitus type 2, optic neuritis with recent hospitalization at [facility] treated with intravenous and subsequent oral steroids, recent right lower extremity cellulitis treated as an outpatient with oral doxycycline, A. fib on Pradaxa presented with back pain and progressively worsening difficulty walking.  Neurology was consulted.  He was found to have MSSA bacteremia with question of L4-L5 discitis/osteomyelitis.  He was started on Ancef.  ID was consulted.  Subsequently, he was found to have infected left sternoclavicular joint.  CT surgery was consulted.  He underwent I&D with application of wound VAC by CT surgery.  ID recommends nafcillin for 14 days till 09/15/2019 then change to cefazolin on 09/16/2019 through 10/12/2019.  Assessment & Plan:  MSSA bacteremia Question of L4-L5 discitis/osteomyelitis -Repeat blood cultures from 08/29/2019 have been negative so far. Echo showed no valvular vegetations.  Spoke to neurosurgery/Dr. Annette Stable on phone on 08/28/2019 and he reviewed the MRI lumbar spine and recommended no surgical intervention at this time. -ID recommended nafcillin for 14 days till 09/15/2019 then changed to cefazolin on 09/16/2019 through 10/12/2019. -Currently afebrile and hemodynamically stable.  Infected left sternoclavicular joint status post I&D with application of wound VAC by CT surgery -Surgical/deep tissue culture grew staph aureus -CT surgery following.  Wound and wound VAC care as per CT surgery recommendations.  Antibiotic plan as above.  Left shoulder pain -Improving. 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   -MRI of the cervical and thoracic spine showed degenerative changes without cord signal abnormality -MRI of the brain showed question of punctate acute infarctions.    Neurology was not convinced about this for now.  CTA of the head and neck showed no evidence of CNS vasculitis -PT/OT recommend CIR. patient is still very slow to progress with PT. CIR thinks that patient might benefit from SNF placement.  Social worker has been consulted. -Neurology has signed off.  Leukocytosis -Resolved.  Thrombocytopenia -Questionable cause.    Resolved.  Dehydration Acute kidney injury -Resolved.  Off IV fluids.  Paroxysmal A. fib on Pradaxa -Continue Tikosyn.  Check potassium and magnesium daily. -Continue Pradaxa.  Diabetes mellitus type 2 -A1c 6.7 on 07/21/2019 -Glucophage held -Continue CBGs with SSI  Hyperlipidemia -Continue Lipitor  OSA -Continue CPAP at bedside  Obesity -Outpatient follow-up  Generalized deconditioning/weakness-PT recommended CIR placement.  CIR thinks that patient might benefit from SNF placement.  DVT prophylaxis:  Pradaxa Code Status: Full Family Communication:  None at bedside  disposition Plan:  SNF versus CIR once bed is available.  Consultants: Neurology/ID/IR/CT surgery/orthopedics.  Spoke to neurosurgery/Dr. Annette Stable on 08/28/2019.  Procedures:  Echo IMPRESSIONS   1. The left ventricle has normal systolic function with an ejection fraction of 60-65%. The cavity size was normal. There is mildly increased left ventricular wall thickness. Left  ventricular diastolic Doppler parameters are consistent with impaired  relaxation. Indeterminate  filling pressures The E/e' is 8-15. No evidence of left ventricular regional wall motion abnormalities. 2. The right ventricle has normal systolic function. The cavity was normal. There is no increase in right ventricular wall thickness. 3. Left atrial size was mildly dilated. 4. The mitral valve is abnormal. Mild thickening of the mitral valve leaflet. No mitral valve vegetation visualized. 5. The tricuspid valve is grossly normal. 6. No vegetation on the aortic valve. 7. The aortic valve is tricuspid. Mild sclerosis of the aortic valve. Aortic valve regurgitation is trivial by color flow Doppler. No stenosis of the aortic valve. 8. The aorta is normal unless otherwise noted.  SUMMARY  LVEF 60-65%, mild LVH, normal wall motion, grade 1 DD, indeterminate LV filling pressure, normal RV function, mild LAE, aortic valve sclerosis with trivial AI, no valvular vegetations  Antimicrobials:  Anti-infectives (From admission, onward)   Start     Dose/Rate Route Frequency Ordered Stop   09/16/19 0600  ceFAZolin (ANCEF) IVPB 2g/100 mL premix     2 g 200 mL/hr over 30 Minutes Intravenous Every 8 hours 09/02/19 1102     08/31/19 2000  nafcillin 2 g in sodium chloride 0.9 % 100 mL IVPB     2 g 200 mL/hr over 30 Minutes Intravenous Every 4 hours 08/31/19 1344 09/15/19 2359   08/31/19 1300  amoxicillin (AMOXIL) capsule 500 mg     500 mg Oral  Once 08/31/19 1117 08/31/19 1227   08/31/19 1258  ceFAZolin (ANCEF) IVPB 2g/100 mL premix  Status:  Discontinued     2 g 200 mL/hr over 30 Minutes Intravenous 30 min pre-op 08/31/19 1258 08/31/19 1713   08/28/19 0300  ceFAZolin (ANCEF) IVPB 2g/100 mL premix  Status:  Discontinued     2 g 200 mL/hr over 30 Minutes Intravenous Every 8 hours 08/28/19 0225 08/31/19 1117       Subjective: Patient seen and examined at bedside.  Sleepy, wakes up slightly.   No overnight worsening back pain, lower extremity pain, fever, nausea or vomiting.   Objective: Vitals:   09/11/19 0912 09/11/19 1452 09/11/19 2135 09/12/19 0618  BP:  128/70 120/66 113/70  Pulse:  78 82 84  Resp:  20 16 16   Temp:  98.5 F (36.9 C) 97.7 F (36.5 C) 98.6 F (37 C)  TempSrc:  Oral    SpO2: 95% 90% 94% 91%  Weight:      Height:        Intake/Output Summary (Last 24 hours) at 09/12/2019 0802 Last data filed at 09/12/2019 0629 Gross per 24 hour  Intake 920 ml  Output 550 ml  Net 370 ml   Filed Weights   08/29/19 0500 08/30/19 0416  Weight: 118 kg 117.9 kg    Examination:  General exam: No distress.  Looks chronically ill.  Sleepy, wakes up slightly. Respiratory system: Bilateral decreased breath sounds at bases with no wheezing cardiovascular system: Rate controlled, S1-S2 heard Gastrointestinal system: Abdomen is nondistended, soft and nontender. Normal bowel sounds heard. Extremities: No cyanosis; trace edema   Data Reviewed: I have personally reviewed following labs and imaging studies  CBC: Recent Labs  Lab 09/08/19 0447 09/09/19 0332  WBC 11.1* 10.9*  NEUTROABS 8.3* 8.2*  HGB 15.1 14.9  HCT 44.8 44.7  MCV 91.1 91.4  PLT 194 [id number]   Basic Metabolic Panel: Recent Labs  Lab 09/08/19 0447 09/09/19 0332 09/10/19 0423 09/11/19 0400 09/12/19 0411  NA 134* 135 135 135 136  K 3.9 3.7 4.3 4.4 4.4  CL 97* 99 100 100 100  CO2 26 27 25 25 25   GLUCOSE 149* 141* 135* 136* 137*  BUN 15 14 13 15 14   CREATININE 0.93 0.90 0.90 0.89 0.96  CALCIUM 9.4 9.4 9.5 9.5 9.6  MG 1.8 1.8 1.8 1.8 1.9   GFR: Estimated Creatinine Clearance: 97.7 mL/min (by C-G formula based on SCr of 0.96 mg/dL). Liver Function Tests: No results for input(s): AST, ALT, ALKPHOS, BILITOT, PROT, ALBUMIN in the last 168 hours. No results for input(s): LIPASE, AMYLASE in the last 168 hours. No results for input(s): AMMONIA in the last 168 hours. Coagulation Profile: No results for  input(s): INR, PROTIME in the last 168 hours. Cardiac Enzymes: No results for input(s): CKTOTAL, CKMB, CKMBINDEX, TROPONINI in the last 168 hours. BNP (last 3 results) No results for input(s): PROBNP in the last 8760 hours. HbA1C: No results for input(s): HGBA1C in the last 72 hours. CBG: Recent Labs  Lab 09/11/19 0743 09/11/19 0953 09/11/19 1214 09/11/19 1713 09/11/19 2136  GLUCAP 149* 167* 134* 119* 144*   Lipid Profile: No results for input(s): CHOL, HDL, LDLCALC, TRIG, CHOLHDL, LDLDIRECT in the last 72 hours. Thyroid Function Tests: No results for input(s): TSH, T4TOTAL, FREET4, T3FREE, THYROIDAB in the last 72 hours. Anemia Panel: No results for input(s): VITAMINB12, FOLATE, FERRITIN, TIBC, IRON, RETICCTPCT in the last 72 hours. Sepsis Labs: No results for input(s): PROCALCITON, LATICACIDVEN in the last 168 hours.  No results found for this or any previous visit (from the past 240 hour(s)).       Radiology Studies: No results found.      Scheduled Meds: . atorvastatin  80 mg Oral QHS  . brimonidine  1 drop Both Eyes TID  . dabigatran  150 mg Oral Q12H  . dofetilide  500 mcg Oral BID  . fluticasone furoate-vilanterol  1 puff Inhalation Daily  . insulin aspart  0-15 Units Subcutaneous TID WC  . insulin aspart  0-5 Units Subcutaneous QHS  . loratadine  10 mg Oral Daily  . multivitamin with minerals  1 tablet Oral Daily  . pantoprazole  40 mg Oral Daily  . polyethylene glycol  17 g Oral BID  . potassium chloride  40 mEq Oral BID  . Ensure Max Protein  11 oz Oral BID  . senna-docusate  1 tablet Oral BID  . sodium chloride flush  10-40 mL Intracatheter Q12H   Continuous Infusions: . [START ON 09/16/2019]  ceFAZolin (ANCEF) IV    . nafcillin IV 2 g (09/12/19 0407)     LOS: 16 days        Aline August, MD [facility] 09/12/2019, 8:02 AM

## 2019-09-13 LAB — CBC WITH DIFFERENTIAL/PLATELET
Abs Immature Granulocytes: 0.03 10*3/uL (ref 0.00–0.07)
Basophils Absolute: 0 10*3/uL (ref 0.0–0.1)
Basophils Relative: 0 %
Eosinophils Absolute: 0.5 10*3/uL (ref 0.0–0.5)
Eosinophils Relative: 5 %
HCT: 44.3 % (ref 39.0–52.0)
Hemoglobin: 14.8 g/dL (ref 13.0–17.0)
Immature Granulocytes: 0 %
Lymphocytes Relative: 18 %
Lymphs Abs: 1.7 10*3/uL (ref 0.7–4.0)
MCH: 30.7 pg (ref 26.0–34.0)
MCHC: 33.4 g/dL (ref 30.0–36.0)
MCV: 91.9 fL (ref 80.0–100.0)
Monocytes Absolute: 0.9 10*3/uL (ref 0.1–1.0)
Monocytes Relative: 9 %
Neutro Abs: 6.6 10*3/uL (ref 1.7–7.7)
Neutrophils Relative %: 68 %
Platelets: 188 10*3/uL (ref 150–400)
RBC: 4.82 MIL/uL (ref 4.22–5.81)
RDW: 14.5 % (ref 11.5–15.5)
WBC: 9.6 10*3/uL (ref 4.0–10.5)
nRBC: 0 % (ref 0.0–0.2)

## 2019-09-13 LAB — BASIC METABOLIC PANEL
Anion gap: 9 (ref 5–15)
BUN: 17 mg/dL (ref 8–23)
CO2: 25 mmol/L (ref 22–32)
Calcium: 9.2 mg/dL (ref 8.9–10.3)
Chloride: 100 mmol/L (ref 98–111)
Creatinine, Ser: 0.98 mg/dL (ref 0.61–1.24)
GFR calc Af Amer: >60 mL/min (ref >60)
GFR calc non Af Amer: >60 mL/min (ref >60)
Glucose, Bld: 153 mg/dL — ABNORMAL HIGH (ref 70–99)
Potassium: 4.3 mmol/L (ref 3.5–5.1)
Sodium: 134 mmol/L — ABNORMAL LOW (ref 135–145)

## 2019-09-13 LAB — MAGNESIUM: Magnesium: 1.7 mg/dL (ref 1.7–2.4)

## 2019-09-13 LAB — GLUCOSE, CAPILLARY
Glucose-Capillary: 155 mg/dL — ABNORMAL HIGH (ref 70–99)
Glucose-Capillary: 162 mg/dL — ABNORMAL HIGH (ref 70–99)
Glucose-Capillary: 157 mg/dL — ABNORMAL HIGH (ref 70–99)
Glucose-Capillary: 140 mg/dL — ABNORMAL HIGH (ref 70–99)

## 2019-09-13 MED ORDER — INFLUENZA VAC A&B SA ADJ QUAD 0.5 ML IM PRSY
0.5000 mL | PREFILLED_SYRINGE | INTRAMUSCULAR | Status: DC
  Filled 2019-09-13: qty 0.5

## 2019-09-13 MED ORDER — MAGNESIUM OXIDE 400 (241.3 MG) MG PO TABS
400.0000 mg | ORAL_TABLET | Freq: Every day | ORAL | Status: DC
  Administered 2019-09-13 – 2019-09-15 (×3): 400 mg via ORAL
  Filled 2019-09-13 (×3): qty 1

## 2019-09-13 NOTE — Progress Notes (Signed)
Physical Therapy Treatment Patient Details Name: [name] [name]. MRN: [id number] DOB: 06/28/1951 Today's Date: 09/13/2019    History of Present Illness [name] [name]. is a 68 y.o. male with medical history significant of OSA on CPAP; perforated appy (2015); obesity; CAD s/p stents; HLD; DM; optic nerve neuritis; and afib on Pradaxa presenting with back pain.    PT Comments    Pt had a good session today and pt felt encouraged by his performance.  Pt stated, "this is the best day I've had." He was able to ambulate in room with RW and stand for 7 mins at sink while performing ADLs with heavy reliance on sink.  Feel that pt continues to be a good candidate for CIR, especially with his performance today and pt's motivation.  Pt educated on use of lumbar corset and re-positioning/tightening, etc.  Follow Up Recommendations  CIR     Equipment Recommendations  Other (comment)(to be determined)    Recommendations for Other Services Rehab consult     Precautions / Restrictions Precautions Precautions: Fall Precaution Comments: wound vac L Mar-Mac joint  Required Braces or Orthoses: Spinal Brace Spinal Brace: Lumbar corset Restrictions Weight Bearing Restrictions: No Other Position/Activity Restrictions: wound vac    Mobility  Bed Mobility Overal bed mobility: Needs Assistance Bed Mobility: Rolling;Sidelying to Sit Rolling: Supervision Sidelying to sit: Min assist;+2 for physical assistance       General bed mobility comments: Pt with excellent recall of log rolling.  MIN of 2 to get trunk upright. Donned lumbar corset in sitting.  Transfers Overall transfer level: Needs assistance Equipment used: Rolling walker (2 wheeled) Transfers: Sit to/from Stand Sit to Stand: Min assist;+2 physical assistance;From elevated surface         General transfer comment: cues for hand placement, MIN of 2 for powering up  Ambulation/Gait Ambulation/Gait assistance: Min assist;+2  safety/equipment Gait Distance (Feet): 10 Feet Assistive device: Rolling walker (2 wheeled) Gait Pattern/deviations: Step-through pattern;Decreased stride length;Trunk flexed Gait velocity: decreased   General Gait Details: heavy reliance on RW, but pt reports feeling good with gait today   Stairs             Wheelchair Mobility    Modified Rankin (Stroke Patients Only)       Balance Overall balance assessment: Needs assistance Sitting-balance support: Feet supported;Bilateral upper extremity supported Sitting balance-Leahy Scale: Fair Sitting balance - Comments: some dizziness upon sitting which resolved after ~3 mins   Standing balance support: Bilateral upper extremity supported;During functional activity Standing balance-Leahy Scale: Poor Standing balance comment: heavy reliance on BUE during functional activity                            Cognition Arousal/Alertness: Awake/alert Behavior During Therapy: WFL for tasks assessed/performed Overall Cognitive Status: Within Functional Limits for tasks assessed                                 General Comments: Pt. motivated with therapy      Exercises General Exercises - Lower Extremity Hip Flexion/Marching: Strengthening;5 reps;Both;Standing    General Comments        Pertinent Vitals/Pain Pain Assessment: Faces Faces Pain Scale: Hurts a little bit Pain Location: shoulders  Pain Descriptors / Indicators: Grimacing Pain Intervention(s): Premedicated before session;Monitored during session    Home Living  Prior Function            PT Goals (current goals can now be found in the care plan section) Acute Rehab PT Goals Patient Stated Goal: to be able to take care of myself PT Goal Formulation: With patient Time For Goal Achievement: 09/27/19 Potential to Achieve Goals: Good Progress towards PT goals: Progressing toward goals;Goals met and updated -  see care plan    Frequency    Min 4X/week      PT Plan Frequency needs to be updated;Current plan remains appropriate    Co-evaluation PT/OT/SLP Co-Evaluation/Treatment: Yes Reason for Co-Treatment: Complexity of the patient's impairments (multi-system involvement);For patient/therapist safety PT goals addressed during session: Mobility/safety with mobility;Balance;Proper use of DME OT goals addressed during session: ADL's and self-care      AM-PAC PT "6 Clicks" Mobility   Outcome Measure  Help needed turning from your back to your side while in a flat bed without using bedrails?: A Lot Help needed moving from lying on your back to sitting on the side of a flat bed without using bedrails?: A Little Help needed moving to and from a bed to a chair (including a wheelchair)?: A Little Help needed standing up from a chair using your arms (e.g., wheelchair or bedside chair)?: A Little Help needed to walk in hospital room?: A Lot Help needed climbing 3-5 steps with a railing? : A Lot 6 Click Score: 15    End of Session Equipment Utilized During Treatment: Gait belt Activity Tolerance: Patient tolerated treatment well Patient left: with call bell/phone within reach;in chair;with family/visitor present;with nursing/sitter in room Nurse Communication: Mobility status PT Visit Diagnosis: Muscle weakness (generalized) (M62.81);Other abnormalities of gait and mobility (R26.89);Difficulty in walking, not elsewhere classified (R26.2);Pain Pain - Right/Left: Left Pain - part of body: Shoulder     Time: 7867-6720 PT Time Calculation (min) (ACUTE ONLY): 36 min  Charges:  $Gait Training: 8-22 mins                     [name] [name], [place] Pager [phone] 09/13/2019    Galen Manila 09/13/2019, 11:56 AM

## 2019-09-13 NOTE — Progress Notes (Signed)
Patient ID: [name] [name]., male   DOB: [birth year], 68 y.o.   MRN: [id number]  PROGRESS NOTE    [name].  [id number] DOB: 09-26-1951 DOA: 08/27/2019 PCP: Burnis Medin, MD   Brief Narrative:  68 year old male with history of OSA on CPAP, perforated appendicitis in 2015, CAD status post stents, hyperlipidemia, diabetes mellitus type 2, optic neuritis with recent hospitalization at [facility] treated with intravenous and subsequent oral steroids, recent right lower extremity cellulitis treated as an outpatient with oral doxycycline, A. fib on Pradaxa presented with back pain and progressively worsening difficulty walking.  Neurology was consulted.  He was found to have MSSA bacteremia with question of L4-L5 discitis/osteomyelitis.  He was started on Ancef.  ID was consulted.  Subsequently, he was found to have infected left sternoclavicular joint.  CT surgery was consulted.  He underwent I&D with application of wound VAC by CT surgery.  ID recommends nafcillin for 14 days till 09/15/2019 then change to cefazolin on 09/16/2019 through 10/12/2019.  Assessment & Plan:  MSSA bacteremia Question of L4-L5 discitis/osteomyelitis -Repeat blood cultures from 08/29/2019 have been negative so far. Echo showed no valvular vegetations.  Spoke to neurosurgery/Dr. Annette Stable on phone on 08/28/2019 and he reviewed the MRI lumbar spine and recommended no surgical intervention at this time. -ID recommended nafcillin for 14 days till 09/15/2019 then changed to cefazolin on 09/16/2019 through 10/12/2019. -Currently afebrile and hemodynamically stable.  Infected left sternoclavicular joint status post I&D with application of wound VAC by CT surgery -Surgical/deep tissue culture grew staph aureus -CT surgery following.  Wound and wound VAC care as per CT surgery recommendations.  Antibiotic plan as above.  Left shoulder pain -Improving. 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   -MRI of the cervical and thoracic spine showed degenerative changes without cord signal abnormality -MRI of the brain showed question of punctate acute infarctions.    Neurology was not convinced about this for now.  CTA of the head and neck showed no evidence of CNS vasculitis -Currently awaiting SNF placement.  Social worker has been consulted. -Neurology has signed off.  Leukocytosis -Resolved.  Thrombocytopenia -Questionable cause.    Resolved.  Dehydration Acute kidney injury -Resolved.  Off IV fluids.  Paroxysmal A. fib on Pradaxa -Continue Tikosyn.  Check potassium and magnesium daily.  Replace as needed. -Continue Pradaxa.  Diabetes mellitus type 2 -A1c 6.7 on 07/21/2019 -Glucophage held -Continue CBGs with SSI  Hyperlipidemia -Continue Lipitor  OSA -Continue CPAP at bedside  Obesity -Outpatient follow-up  Generalized deconditioning/weakness-Currently awaiting SNF placement.  Social worker has been consulted.  DVT prophylaxis:  Pradaxa Code Status: Full Family Communication:  None at bedside  disposition Plan:  SNF once bed is available.  Consultants: Neurology/ID/IR/CT surgery/orthopedics.  Spoke to neurosurgery/Dr. Annette Stable on 08/28/2019.  Procedures:  Echo IMPRESSIONS   1. The left ventricle has normal systolic function with an ejection fraction of 60-65%. The cavity size was normal. There is mildly increased left ventricular wall thickness. Left ventricular diastolic Doppler parameters are consistent with impaired  relaxation. Indeterminate filling  pressures The E/e' is 8-15. No evidence of left ventricular regional wall motion abnormalities. 2. The right ventricle  has normal systolic function. The cavity was normal. There is no increase in right ventricular wall thickness. 3. Left atrial size was mildly dilated. 4. The mitral valve is abnormal. Mild thickening of the mitral valve leaflet. No mitral valve vegetation visualized. 5. The tricuspid valve is grossly normal. 6. No vegetation on the aortic valve. 7. The aortic valve is tricuspid. Mild sclerosis of the aortic valve. Aortic valve regurgitation is trivial by color flow Doppler. No stenosis of the aortic valve. 8. The aorta is normal unless otherwise noted.  SUMMARY  LVEF 60-65%, mild LVH, normal wall motion, grade 1 DD, indeterminate LV filling pressure, normal RV function, mild LAE, aortic valve sclerosis with trivial AI, no valvular vegetations  Antimicrobials:  Anti-infectives (From admission, onward)   Start     Dose/Rate Route Frequency Ordered Stop   09/16/19 0600  ceFAZolin (ANCEF) IVPB 2g/100 mL premix     2 g 200 mL/hr over 30 Minutes Intravenous Every 8 hours 09/02/19 1102     08/31/19 2000  nafcillin 2 g in sodium chloride 0.9 % 100 mL IVPB     2 g 200 mL/hr over 30 Minutes Intravenous Every 4 hours 08/31/19 1344 09/15/19 2359   08/31/19 1300  amoxicillin (AMOXIL) capsule 500 mg     500 mg Oral  Once 08/31/19 1117 08/31/19 1227   08/31/19 1258  ceFAZolin (ANCEF) IVPB 2g/100 mL premix  Status:  Discontinued     2 g 200 mL/hr over 30 Minutes Intravenous 30 min pre-op 08/31/19 1258 08/31/19 1713   08/28/19 0300  ceFAZolin (ANCEF) IVPB 2g/100 mL premix  Status:  Discontinued     2 g 200 mL/hr over 30 Minutes Intravenous Every 8 hours 08/28/19 0225 08/31/19 1117      Subjective: Patient seen and examined at bedside.  Patient was feeling very anxious yesterday and felt better after Xanax.  Still feels very weak.  No overnight fever or vomiting.  No  worsening shortness of breath or abdominal pain.  Objective: Vitals:   09/12/19 1726 09/12/19 2207 09/13/19 0419 09/13/19 0543  BP: 120/68 101/65  129/75  Pulse: 83 82  78  Resp:  16  16  Temp:  97.9 F (36.6 C)  98.4 F (36.9 C)  TempSrc:  Oral  Oral  SpO2: 94% 91%  92%  Weight:   116.3 kg   Height:        Intake/Output Summary (Last 24 hours) at 09/13/2019 0937 Last data filed at 09/13/2019 0530 Gross per 24 hour  Intake 980 ml  Output 950 ml  Net 30 ml   Filed Weights   08/29/19 0500 08/30/19 0416 09/13/19 0419  Weight: 118 kg 117.9 kg 116.3 kg    Examination:  General exam: No acute distress.  Looks chronically ill.   Respiratory system: Bilateral decreased breath sounds at bases with no wheezing.  Left upper chest wall wound VAC present.   Cardiovascular system: S1-S2 heard, rate controlled Gastrointestinal system: Abdomen is nondistended, soft and nontender. Normal bowel sounds heard. Extremities: No cyanosis; trace edema   Data Reviewed: I have personally reviewed following labs and imaging studies  CBC: Recent Labs  Lab 09/08/19 0447 09/09/19 0332 09/13/19 0413  WBC 11.1* 10.9* 9.6  NEUTROABS 8.3* 8.2* 6.6  HGB 15.1 14.9 14.8  HCT 44.8 44.7 44.3  MCV 91.1 91.4 91.9  PLT 194 187 0000000   Basic Metabolic Panel: Recent Labs  Lab 09/09/19 0332 09/10/19 0423 09/11/19 0400 09/12/19 0411 09/13/19 0413  NA 135  135 135 136 134*  K 3.7 4.3 4.4 4.4 4.3  CL 99 100 100 100 100  CO2 27 25 25 25 25   GLUCOSE 141* 135* 136* 137* 153*  BUN 14 13 15 14 17   CREATININE 0.90 0.90 0.89 0.96 0.98  CALCIUM 9.4 9.5 9.5 9.6 9.2  MG 1.8 1.8 1.8 1.9 1.7   GFR: Estimated Creatinine Clearance: 95.1 mL/min (by C-G formula based on SCr of 0.98 mg/dL). Liver Function Tests: No results for input(s): AST, ALT, ALKPHOS, BILITOT, PROT, ALBUMIN in the last 168 hours. No results for input(s): LIPASE, AMYLASE in the last 168 hours. No results for input(s): AMMONIA in the last 168  hours. Coagulation Profile: No results for input(s): INR, PROTIME in the last 168 hours. Cardiac Enzymes: No results for input(s): CKTOTAL, CKMB, CKMBINDEX, TROPONINI in the last 168 hours. BNP (last 3 results) No results for input(s): PROBNP in the last 8760 hours. HbA1C: No results for input(s): HGBA1C in the last 72 hours. CBG: Recent Labs  Lab 09/12/19 0942 09/12/19 1220 09/12/19 1653 09/12/19 2203 09/13/19 0752  GLUCAP 191* 124* 138* 172* 155*   Lipid Profile: No results for input(s): CHOL, HDL, LDLCALC, TRIG, CHOLHDL, LDLDIRECT in the last 72 hours. Thyroid Function Tests: No results for input(s): TSH, T4TOTAL, FREET4, T3FREE, THYROIDAB in the last 72 hours. Anemia Panel: No results for input(s): VITAMINB12, FOLATE, FERRITIN, TIBC, IRON, RETICCTPCT in the last 72 hours. Sepsis Labs: No results for input(s): PROCALCITON, LATICACIDVEN in the last 168 hours.  No results found for this or any previous visit (from the past 240 hour(s)).       Radiology Studies: No results found.      Scheduled Meds: . atorvastatin  80 mg Oral QHS  . brimonidine  1 drop Both Eyes TID  . dabigatran  150 mg Oral Q12H  . dofetilide  500 mcg Oral BID  . fluticasone furoate-vilanterol  1 puff Inhalation Daily  . insulin aspart  0-15 Units Subcutaneous TID WC  . insulin aspart  0-5 Units Subcutaneous QHS  . loratadine  10 mg Oral Daily  . magnesium oxide  400 mg Oral Daily  . multivitamin with minerals  1 tablet Oral Daily  . pantoprazole  40 mg Oral Daily  . polyethylene glycol  17 g Oral BID  . potassium chloride  40 mEq Oral BID  . Ensure Max Protein  11 oz Oral BID  . senna-docusate  1 tablet Oral BID  . sodium chloride flush  10-40 mL Intracatheter Q12H   Continuous Infusions: . [START ON 09/16/2019]  ceFAZolin (ANCEF) IV    . nafcillin IV 2 g (09/13/19 0836)     LOS: 17 days        Aline August, MD [facility] 09/13/2019, 9:37 AM

## 2019-09-13 NOTE — Progress Notes (Signed)
300 mL sangineous output in wound vac at 2119 and 0619. No increase, minimal(not noticeable) output overnight.

## 2019-09-13 NOTE — TOC Progression Note (Signed)
Transition of Care (TOC) - Progression Note    Patient Details  Name: [name] [name]. MRN: [id number] Date of Birth: Dec 12, 1951  Transition of Care [facility] CM/SW [place], LCSW Phone Number: 09/13/2019, 3:08 PM  Clinical Narrative:    CSW provided patient with SNF bed offers and emailed them to his wife as requested. He reports that he is still hopeful for CIR.    Expected Discharge Plan: [place] Barriers to Discharge: Continued Medical Work up, Ship broker  Expected Discharge Plan and Services Expected Discharge Plan: [place] In-house Referral: Clinical Social Work Discharge Planning Services: NA Post Acute Care Choice: [place] Living arrangements for the past 2 months: Single Family Home                 DME Arranged: N/A DME Agency: NA       HH Arranged: NA HH Agency: NA         Social Determinants of Health (SDOH) Interventions    Readmission Risk Interventions No flowsheet data found.

## 2019-09-13 NOTE — Consult Note (Addendum)
[place] Nurse woundfollow-upconsult note VAC dressing change toleft chest, PA at the bedside to assess wound appearance during dressing change Wound type:full thickness post-op wound Wound bed:100% red, bone palpable, slowly decreasing in size Drainage (amount, consistency, odor)mod amt dark red-brown drainage, no odor Periwound:intact Dressing procedure/placement/frequency:one piece of black foam removed, one piece placed into wound bed.Pt had pain meds prior to procedure and tolerated with mod amt discomfort.Immediate seal obtainedat 179mm cont suction. [place] team will plan to change [place] MSN, RN, [place], [place], [place]

## 2019-09-13 NOTE — Progress Notes (Signed)
Set up patients home system on bedside table. Added H20. Max 20 Min 5 and 4L bled in. Pt places himself on and off.

## 2019-09-13 NOTE — Progress Notes (Addendum)
13 Days Post-Op Procedure(s) (LRB): LEFT STERNOCLAVICULAR WOUND DEBRIDEMENT WITH APPLICATION OF WOUND VAC (Left) Subjective: Pt seen during wound vac change today.  He said he was able to sit up in the bedside chair this weekend for 40 minutes which is and improvement over last week's attempts.    Objective: Vital signs in last 24 hours: Temp:  [97.9 F (36.6 C)-98.4 F (36.9 C)] 98.4 F (36.9 C) (09/14 0543) Pulse Rate:  [72-83] 78 (09/14 0543) Resp:  [16-18] 16 (09/14 0543) BP: (101-129)/(57-75) 129/75 (09/14 0543) SpO2:  [91 %-94 %] 92 % (09/14 0543) Weight:  [116.3 kg] 116.3 kg (09/14 0419)  Hemodynamic parameters for last 24 hours:    Intake/Output from previous day: 09/13 0701 - 09/14 0700 In: 1230 [P.O.:730; IV Piggyback:500] Out: 950 [Urine:950] Intake/Output this shift: Total I/O In: 120 [P.O.:120] Out: -   Physical Exam: General appearance:alert, cooperative Wound:The right chest wound was examined during vac change at the bedside today. The sponge ws removed intact. The wound is clean increasing granulation and no slough.   The wound was dry.     Lab Results: Recent Labs    09/13/19 0413  WBC 9.6  HGB 14.8  HCT 44.3  PLT 188   BMET:  Recent Labs    09/12/19 0411 09/13/19 0413  NA 136 134*  K 4.4 4.3  CL 100 100  CO2 25 25  GLUCOSE 137* 153*  BUN 14 17  CREATININE 0.96 0.98  CALCIUM 9.6 9.2    PT/INR: No results for input(s): LABPROT, INR in the last 72 hours. ABG    Component Value Date/Time   TCO2 26 03/01/2017 0258   CBG (last 3)  Recent Labs    09/12/19 1653 09/12/19 2203 09/13/19 0752  GLUCAP 138* 172* 155*    Assessment/Plan: S/P Procedure(s) (LRB): LEFT STERNOCLAVICULAR WOUND DEBRIDEMENT WITH APPLICATION OF WOUND VAC (Left)  -POD13 incision and drainage of left [place] joint abscess. The wound is clean and now is beginning to granulate. Will continue M-W-F dressing changes. Re-evaluate the wound at next vac change.  Continue  IV Cefazolin and nafcillin for MSSA cultured from the wound per ID recs. -Continue PT   LOS: 17 days    [name], PA-C [id number] 09/13/2019 I have seen and examined [name] [name]. and agree with the above assessment  and plan.  Grace Isaac MD [place] [phone] Office [phone] 09/14/2019 7:10 AM

## 2019-09-13 NOTE — Plan of Care (Signed)
Ready for DC to SNF when COVID results and bed ready  Problem: Education: Goal: Knowledge of General Education information will improve Description: Including pain rating scale, medication(s)/side effects and non-pharmacologic comfort measures Outcome: Adequate for Discharge   Problem: Health Behavior/Discharge Planning: Goal: Ability to manage health-related needs will improve Outcome: Adequate for Discharge   Problem: Clinical Measurements: Goal: Ability to maintain clinical measurements within normal limits will improve Outcome: Adequate for Discharge Goal: Will remain free from infection Outcome: Adequate for Discharge Goal: Diagnostic test results will improve Outcome: Adequate for Discharge Goal: Respiratory complications will improve Outcome: Adequate for Discharge   Problem: Activity: Goal: Risk for activity intolerance will decrease Outcome: Adequate for Discharge   Problem: Coping: Goal: Level of anxiety will decrease Outcome: Adequate for Discharge   Problem: Elimination: Goal: Will not experience complications related to bowel motility Outcome: Adequate for Discharge Goal: Will not experience complications related to urinary retention Outcome: Adequate for Discharge   Problem: Pain Managment: Goal: General experience of comfort will improve Outcome: Adequate for Discharge   Problem: Safety: Goal: Ability to remain free from injury will improve Outcome: Adequate for Discharge   Problem: Skin Integrity: Goal: Risk for impaired skin integrity will decrease Outcome: Adequate for Discharge

## 2019-09-13 NOTE — Progress Notes (Signed)
Occupational Therapy Treatment Patient Details Name: [name] [name]. MRN: [id number] DOB: 12-09-1951 Today's Date: 09/13/2019    History of present illness [name] [name]. is a 68 y.o. male with medical history significant of OSA on CPAP; perforated appy (2015); obesity; CAD s/p stents; HLD; DM; optic nerve neuritis; and afib on Pradaxa presenting with back pain.   OT comments  Pt making steady progress towards OT goal this day.Pt able to carryover log roll technique well this session needing MIN A +2 to elevate trunk into sitting. Pt able to tolerate EOB sitting ~ 3 minutes as precursor to higher level OOB ADLs.  Pt sit >stand from EOB with RW MIN A+2.  Pt completed functional mobility MIN A +2 with RW from EOB>sink. Pt stood at sink for 7 minutes to complete grooming tasks with no rest breaks and no c/o pain. Pt states, "this is the best he's felt OOB." Continue to recommend CIR as pt has made progress with therapies this session. Feel pt would benefit from CIR level therapies to maximize functional independence to return to PLOF. Will continue to follow for acute OT needs.   Follow Up Recommendations  CIR;Supervision/Assistance - 24 hour    Equipment Recommendations  None recommended by OT    Recommendations for Other Services      Precautions / Restrictions Precautions Precautions: Fall Precaution Comments: wound vac L [place] joint  Required Braces or Orthoses: Spinal Brace Spinal Brace: Lumbar corset Restrictions Weight Bearing Restrictions: No Other Position/Activity Restrictions: wound vac       Mobility Bed Mobility Overal bed mobility: Needs Assistance Bed Mobility: Rolling;Sidelying to Sit Rolling: Supervision Sidelying to sit: Min assist;+2 for physical assistance       General bed mobility comments: good carryover of log roll technique; MIN A +2 to elevate trunk  Transfers Overall transfer level: Needs assistance Equipment used: Rolling walker (2  wheeled) Transfers: Sit to/from Stand Sit to Stand: +2 physical assistance;From elevated surface;Min assist         General transfer comment: cues for hand placement from elevated surface; assistance required to power up into standing    Balance Overall balance assessment: Needs assistance Sitting-balance support: Feet supported;Bilateral upper extremity supported Sitting balance-Leahy Scale: Fair     Standing balance support: Bilateral upper extremity supported;During functional activity Standing balance-Leahy Scale: Poor Standing balance comment: heavy reliance on BUE during functional activity                           ADL either performed or assessed with clinical judgement   ADL Overall ADL's : Needs assistance/impaired     Grooming: Standing;Oral care;Min guard Grooming Details (indicate cue type and reason): MIN guard for safety; heavy reliance on BUE at sink for support; able to stand for 7 minutes but fatigued                 Toilet Transfer: +2 for safety/equipment;+2 for physical assistance;Minimal assistance;Ambulation Toilet Transfer Details (indicate cue type and reason): simulated toilet transfer; +2 MIN for safety chair follow         Functional mobility during ADLs: Minimal assistance;Min guard;+2 for physical assistance;+2 for safety/equipment General ADL Comments: pt able to tolerate OOB ADLS standing at sink 7 mins for self- care routine; MIN A +2 for safety and mobility progression; pt motivated by lots of positivity     Vision Baseline Vision/History: Wears glasses Wears Glasses: At all times Patient Visual Report:  No change from baseline     Perception     Praxis      Cognition Arousal/Alertness: Awake/alert Behavior During Therapy: WFL for tasks assessed/performed Overall Cognitive Status: Within Functional Limits for tasks assessed                                 General Comments: Pt. motivated with  therapy        Exercises     Shoulder Instructions       General Comments      Pertinent Vitals/ Pain       Pain Assessment: No/denies pain  Home Living                                          Prior Functioning/Environment              Frequency           Progress Toward Goals  OT Goals(current goals can now be found in the care plan section)  Progress towards OT goals: Progressing toward goals  Acute Rehab OT Goals Time For Goal Achievement: 09/15/19 Potential to Achieve Goals: Good  Plan Discharge plan remains appropriate;Frequency remains appropriate    Co-evaluation    PT/OT/SLP Co-Evaluation/Treatment: Yes Reason for Co-Treatment: For patient/therapist safety;To address functional/ADL transfers   OT goals addressed during session: ADL's and self-care      AM-PAC OT "6 Clicks" Daily Activity     Outcome Measure   Help from another person eating meals?: None Help from another person taking care of personal grooming?: A Little Help from another person toileting, which includes using toliet, bedpan, or urinal?: A Little Help from another person bathing (including washing, rinsing, drying)?: A Little Help from another person to put on and taking off regular upper body clothing?: A Little Help from another person to put on and taking off regular lower body clothing?: A Lot 6 Click Score: 18    End of Session Equipment Utilized During Treatment: Gait belt;Rolling walker;Other (comment)(lumbar corset)  OT Visit Diagnosis: Other abnormalities of gait and mobility (R26.89);Muscle weakness (generalized) (M62.81);Pain   Activity Tolerance Patient tolerated treatment well   Patient Left with call bell/phone within reach;in chair   Nurse Communication Mobility status(found pill in bed- RN to f/u)        Time: [id number] OT Time Calculation (min): 36 min  Charges: OT General Charges $OT Visit: 1 Visit OT Treatments $Self  Care/Home Management : 23-37 mins  [place], [place] [phone] [place] 09/13/2019, 11:21 AM

## 2019-09-13 NOTE — Progress Notes (Signed)
Inpatient Rehab Admissions Coordinator:    Met with pt at bedside. States he had a good day in therapy (was able to stand at the sink for ADLs) and was able to ambulate to hallway on Friday.  Continues to be very deconditioned, but feel that he has progressed enough to attempt insurance authorization for admission later this week.  Will follow for possible admission pending approval from insurance.    , PT, DPT Admissions Coordinator [phone] 09/13/19  12:10 PM   

## 2019-09-14 ENCOUNTER — Ambulatory Visit: Payer: Medicare Other | Admitting: Orthopaedic Surgery

## 2019-09-14 LAB — BASIC METABOLIC PANEL
Anion gap: 10 (ref 5–15)
BUN: 19 mg/dL (ref 8–23)
CO2: 24 mmol/L (ref 22–32)
Calcium: 9.4 mg/dL (ref 8.9–10.3)
Chloride: 101 mmol/L (ref 98–111)
Creatinine, Ser: 0.97 mg/dL (ref 0.61–1.24)
GFR calc Af Amer: >60 mL/min (ref >60)
GFR calc non Af Amer: >60 mL/min (ref >60)
Glucose, Bld: 146 mg/dL — ABNORMAL HIGH (ref 70–99)
Potassium: 4.2 mmol/L (ref 3.5–5.1)
Sodium: 135 mmol/L (ref 135–145)

## 2019-09-14 LAB — GLUCOSE, CAPILLARY
Glucose-Capillary: 157 mg/dL — ABNORMAL HIGH (ref 70–99)
Glucose-Capillary: 170 mg/dL — ABNORMAL HIGH (ref 70–99)
Glucose-Capillary: 129 mg/dL — ABNORMAL HIGH (ref 70–99)
Glucose-Capillary: 147 mg/dL — ABNORMAL HIGH (ref 70–99)

## 2019-09-14 LAB — MAGNESIUM: Magnesium: 1.8 mg/dL (ref 1.7–2.4)

## 2019-09-14 LAB — NOVEL CORONAVIRUS, NAA (HOSP ORDER, SEND-OUT TO REF LAB; TAT 18-24 HRS): SARS-CoV-2, NAA: NOT DETECTED

## 2019-09-14 MED ORDER — PRO-STAT SUGAR FREE PO LIQD
30.0000 mL | Freq: Two times a day (BID) | ORAL | Status: DC
  Administered 2019-09-14 – 2019-09-15 (×3): 30 mL via ORAL
  Filled 2019-09-14 (×2): qty 30

## 2019-09-14 NOTE — Progress Notes (Signed)
Physical Therapy Treatment Patient Details Name: [name] [name]. MRN: [id number] DOB: [birth year] Today's Date: 09/14/2019    History of Present Illness Kristoffer Skold. is a 68 y.o. male with medical history significant of OSA on CPAP; perforated appy (2015); obesity; CAD s/p stents; HLD; DM; optic nerve neuritis; and afib on Pradaxa presenting with back pain.    PT Comments    Supervision for log roll, max assist to raise trunk for sidelying to sit, pt sat at edge of bed x 20 minutes. Attempted sit to stand x 3 trials, however 8/10 back pain prevented him from achieving standing position. While sitting pt relied on BUEs to unweight his spine to decrease low back pain. Performed BLE strengthening exercises in sitting. Pt puts forth good effort and is motivated to progress.     Follow Up Recommendations  CIR     Equipment Recommendations  Other (comment)(to be determined)    Recommendations for Other Services Rehab consult     Precautions / Restrictions Precautions Precautions: Fall Precaution Comments: wound vac L [place] joint  Required Braces or Orthoses: Spinal Brace Spinal Brace: Lumbar corset Restrictions Weight Bearing Restrictions: No Other Position/Activity Restrictions: wound vac    Mobility  Bed Mobility Overal bed mobility: Needs Assistance Bed Mobility: Rolling;Sidelying to Sit Rolling: Supervision Sidelying to sit: Max assist       General bed mobility comments: pt able to perform log roll without assist, Max A to raise trunk 2* 8/10 LBP, pt remedicated with flexeril  Transfers Overall transfer level: Needs assistance Equipment used: Rolling walker (2 wheeled) Transfers: Sit to/from Stand           General transfer comment: attempted sit to stand x 3 however pt unable to tolerate increased LBP when he placed UEs on RW, he was using BUEs to unweight his spine while sitting on edge of bed  Ambulation/Gait                 Stairs              Wheelchair Mobility    Modified Rankin (Stroke Patients Only)       Balance Overall balance assessment: Needs assistance Sitting-balance support: Feet supported;Bilateral upper extremity supported Sitting balance-Leahy Scale: Fair Sitting balance - Comments: pt sat edge of bed x 20 minutes, heavy reliance upon BUEs to unweight spine (pt pushed B hands down into bed), unable to tolerate increased LBP when he removed hands from bed                                    Cognition Arousal/Alertness: Awake/alert Behavior During Therapy: WFL for tasks assessed/performed Overall Cognitive Status: Within Functional Limits for tasks assessed                                 General Comments: Pt. motivated with therapy      Exercises General Exercises - Lower Extremity Ankle Circles/Pumps: AROM;Both;10 reps;Seated Long Arc Quad: AROM;Both;10 reps;Seated    General Comments        Pertinent Vitals/Pain Pain Score: 8  Pain Location: low back Pain Descriptors / Indicators: Sharp Pain Intervention(s): Limited activity within patient's tolerance;Monitored during session;Premedicated before session    Home Living  Prior Function            PT Goals (current goals can now be found in the care plan section) Acute Rehab PT Goals Patient Stated Goal: to decrease pain, get up to chair, wife is disabled so pt does all shopping, housework, etc PT Goal Formulation: With patient Time For Goal Achievement: 09/27/19 Potential to Achieve Goals: Good Progress towards PT goals: Progressing toward goals    Frequency    Min 4X/week      PT Plan Current plan remains appropriate    Co-evaluation PT/OT/SLP Co-Evaluation/Treatment: Yes            AM-PAC PT "6 Clicks" Mobility   Outcome Measure  Help needed turning from your back to your side while in a flat bed without using bedrails?: None Help needed moving from  lying on your back to sitting on the side of a flat bed without using bedrails?: A Lot Help needed moving to and from a bed to a chair (including a wheelchair)?: A Lot Help needed standing up from a chair using your arms (e.g., wheelchair or bedside chair)?: A Lot Help needed to walk in hospital room?: A Lot Help needed climbing 3-5 steps with a railing? : Total 6 Click Score: 13    End of Session Equipment Utilized During Treatment: Gait belt Activity Tolerance: Patient tolerated treatment well Patient left: with call bell/phone within reach;in bed Nurse Communication: Mobility status PT Visit Diagnosis: Muscle weakness (generalized) (M62.81);Other abnormalities of gait and mobility (R26.89);Difficulty in walking, not elsewhere classified (R26.2);Pain Pain - part of body: (back)     Time: [id number] PT Time Calculation (min) (ACUTE ONLY): 41 min  Charges:  $Therapeutic Exercise: 8-22 mins $Therapeutic Activity: 23-37 mins                     Blondell Reveal Kistler PT 09/14/2019  Acute Rehabilitation Services Pager [phone] Office [phone]

## 2019-09-14 NOTE — Progress Notes (Signed)
Patient ID: [name] Hill., male   DOB: 14-Aug-1951, 69 y.o.   MRN: [id number]  PROGRESS NOTE    [name] [name].  [id number] DOB: 06-Apr-1951 DOA: 08/27/2019 PCP: Burnis Medin, MD   Brief Narrative:  68 year old male with history of OSA on CPAP, perforated appendicitis in 2015, CAD status post stents, hyperlipidemia, diabetes mellitus type 2, optic neuritis with recent hospitalization at [facility] treated with intravenous and subsequent oral steroids, recent right lower extremity cellulitis treated as an outpatient with oral doxycycline, A. fib on Pradaxa presented with back pain and progressively worsening difficulty walking.  Neurology was consulted.  He was found to have MSSA bacteremia with question of L4-L5 discitis/osteomyelitis.  He was started on Ancef.  ID was consulted.  Subsequently, he was found to have infected left sternoclavicular joint.  CT surgery was consulted.  He underwent I&D with application of wound VAC by CT surgery.  ID recommends nafcillin for 14 days till 09/15/2019 then change to cefazolin on 09/16/2019 through 10/12/2019.  Assessment & Plan:  MSSA bacteremia Question of L4-L5 discitis/osteomyelitis -Repeat blood cultures from 08/29/2019 have been negative so far. Echo showed no valvular vegetations.  Spoke to neurosurgery/Dr. Annette Stable on phone on 08/28/2019 and he reviewed the MRI lumbar spine and recommended no surgical intervention at this time. -ID recommended nafcillin for 14 days till 09/15/2019 then changed to cefazolin on 09/16/2019 through 10/12/2019. -Currently afebrile and hemodynamically stable.  Infected left sternoclavicular joint status post I&D with application of wound VAC by CT surgery -Surgical/deep tissue culture grew staph aureus -CT surgery following.  Wound and wound VAC care as per CT surgery recommendations.  Antibiotic plan as above.  Left shoulder pain -Improving. 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   -MRI of the cervical and thoracic spine showed degenerative changes without cord signal abnormality -MRI of the brain showed question of punctate acute infarctions.    Neurology was not convinced about this for now.  CTA of the head and neck showed no evidence of CNS vasculitis -Currently awaiting CIR versus SNF placement.  CIR Education officer, museum following. -Neurology has signed off.  Leukocytosis -Resolved.  Thrombocytopenia -Questionable cause.    Resolved.  Dehydration Acute kidney injury -Resolved.  Off IV fluids.  Paroxysmal A. fib on Pradaxa -Continue Tikosyn.  Check potassium and magnesium daily.  Replace as needed. -Continue Pradaxa.  Diabetes mellitus type 2 -A1c 6.7 on 07/21/2019 -Glucophage held -Continue CBGs with SSI  Hyperlipidemia -Continue Lipitor  OSA -Continue CPAP at bedside  Obesity -Outpatient follow-up  Generalized deconditioning/weakness-Currently awaiting CIR versus SNF placement.   DVT prophylaxis:  Pradaxa Code Status: Full Family Communication:  None at bedside  disposition Plan:  SNF/CIR once bed is available.  Consultants: Neurology/ID/IR/CT surgery/orthopedics.  Spoke to neurosurgery/Dr. Annette Stable on 08/28/2019.  Procedures:  Echo IMPRESSIONS   1. The left ventricle has normal systolic function with an ejection fraction of 60-65%. The cavity size was normal. There is mildly increased left ventricular wall thickness. Left ventricular diastolic Doppler parameters are consistent with impaired  relaxation. Indeterminate filling pressures The  E/e' is 8-15. No evidence of left ventricular regional wall motion abnormalities. 2. The right ventricle has normal  systolic function. The cavity was normal. There is no increase in right ventricular wall thickness. 3. Left atrial size was mildly dilated. 4. The mitral valve is abnormal. Mild thickening of the mitral valve leaflet. No mitral valve vegetation visualized. 5. The tricuspid valve is grossly normal. 6. No vegetation on the aortic valve. 7. The aortic valve is tricuspid. Mild sclerosis of the aortic valve. Aortic valve regurgitation is trivial by color flow Doppler. No stenosis of the aortic valve. 8. The aorta is normal unless otherwise noted.  SUMMARY  LVEF 60-65%, mild LVH, normal wall motion, grade 1 DD, indeterminate LV filling pressure, normal RV function, mild LAE, aortic valve sclerosis with trivial AI, no valvular vegetations  Antimicrobials:  Anti-infectives (From admission, onward)   Start     Dose/Rate Route Frequency Ordered Stop   09/16/19 0600  ceFAZolin (ANCEF) IVPB 2g/100 mL premix     2 g 200 mL/hr over 30 Minutes Intravenous Every 8 hours 09/02/19 1102     08/31/19 2000  nafcillin 2 g in sodium chloride 0.9 % 100 mL IVPB     2 g 200 mL/hr over 30 Minutes Intravenous Every 4 hours 08/31/19 1344 09/15/19 2359   08/31/19 1300  amoxicillin (AMOXIL) capsule 500 mg     500 mg Oral  Once 08/31/19 1117 08/31/19 1227   08/31/19 1258  ceFAZolin (ANCEF) IVPB 2g/100 mL premix  Status:  Discontinued     2 g 200 mL/hr over 30 Minutes Intravenous 30 min pre-op 08/31/19 1258 08/31/19 1713   08/28/19 0300  ceFAZolin (ANCEF) IVPB 2g/100 mL premix  Status:  Discontinued     2 g 200 mL/hr over 30 Minutes Intravenous Every 8 hours 08/28/19 0225 08/31/19 1117      Subjective: Patient seen and examined at bedside.  Denies any overnight fever, nausea, vomiting or worsening abdominal or back pain.  He was able to walk better yesterday with physical therapy.   Objective: Vitals:   09/13/19 0543 09/13/19 1418 09/13/19 2140 09/14/19 0655  BP: 129/75 106/69 131/63 102/66  Pulse: 78 98 82 70  Resp: 16 18  16   Temp: 98.4 F (36.9 C) 97.8 F (36.6 C) 98.1 F (36.7 C) (!) 97.3 F (36.3 C)  TempSrc: Oral Oral Oral Oral  SpO2: 92% 93% 92% 97%  Weight:      Height:        Intake/Output Summary (Last 24 hours) at 09/14/2019 0800 Last data filed at 09/14/2019 0655 Gross per 24 hour  Intake 320 ml  Output 650 ml  Net -330 ml   Filed Weights   08/29/19 0500 08/30/19 0416 09/13/19 0419  Weight: 118 kg 117.9 kg 116.3 kg    Examination:  General exam: No distress.   Respiratory system: Bilateral decreased breath sounds at bases with some scattered crackles.  Left upper chest wall wound VAC present.   Cardiovascular system: Rate controlled, S1-S2 heard Gastrointestinal system: Abdomen is nondistended, soft and nontender. Normal bowel sounds heard. Extremities: No cyanosis; trace edema   Data Reviewed: I have personally reviewed following labs and imaging studies  CBC: Recent Labs  Lab 09/08/19 0447 09/09/19 0332 09/13/19 0413  WBC 11.1* 10.9* 9.6  NEUTROABS 8.3* 8.2* 6.6  HGB 15.1 14.9 14.8  HCT 44.8 44.7 44.3  MCV 91.1 91.4 91.9  PLT 194 187 0000000   Basic Metabolic Panel: Recent Labs  Lab 09/10/19 0423 09/11/19 0400 09/12/19 0411 09/13/19 0413 09/14/19 0344  NA 135 135 136 134* 135  K 4.3 4.4  4.4 4.3 4.2  CL 100 100 100 100 101  CO2 25 25 25 25 24   GLUCOSE 135* 136* 137* 153* 146*  BUN 13 15 14 17 19   CREATININE 0.90 0.89 0.96 0.98 0.97  CALCIUM 9.5 9.5 9.6 9.2 9.4  MG 1.8 1.8 1.9 1.7 1.8   GFR: Estimated Creatinine Clearance: 96.1 mL/min (by C-G formula based on SCr of 0.97 mg/dL). Liver Function Tests: No results for input(s): AST, ALT, ALKPHOS, BILITOT, PROT, ALBUMIN in the last 168 hours. No results for input(s): LIPASE, AMYLASE in the last 168 hours. No results for input(s): AMMONIA in the last 168  hours. Coagulation Profile: No results for input(s): INR, PROTIME in the last 168 hours. Cardiac Enzymes: No results for input(s): CKTOTAL, CKMB, CKMBINDEX, TROPONINI in the last 168 hours. BNP (last 3 results) No results for input(s): PROBNP in the last 8760 hours. HbA1C: No results for input(s): HGBA1C in the last 72 hours. CBG: Recent Labs  Lab 09/12/19 2203 09/13/19 0752 09/13/19 1230 09/13/19 1717 09/13/19 2209  GLUCAP 172* 155* 162* 157* 140*   Lipid Profile: No results for input(s): CHOL, HDL, LDLCALC, TRIG, CHOLHDL, LDLDIRECT in the last 72 hours. Thyroid Function Tests: No results for input(s): TSH, T4TOTAL, FREET4, T3FREE, THYROIDAB in the last 72 hours. Anemia Panel: No results for input(s): VITAMINB12, FOLATE, FERRITIN, TIBC, IRON, RETICCTPCT in the last 72 hours. Sepsis Labs: No results for input(s): PROCALCITON, LATICACIDVEN in the last 168 hours.  No results found for this or any previous visit (from the past 240 hour(s)).       Radiology Studies: No results found.      Scheduled Meds: . atorvastatin  80 mg Oral QHS  . brimonidine  1 drop Both Eyes TID  . dabigatran  150 mg Oral Q12H  . dofetilide  500 mcg Oral BID  . fluticasone furoate-vilanterol  1 puff Inhalation Daily  . influenza vaccine adjuvanted  0.5 mL Intramuscular Tomorrow-1000  . insulin aspart  0-15 Units Subcutaneous TID WC  . insulin aspart  0-5 Units Subcutaneous QHS  . loratadine  10 mg Oral Daily  . magnesium oxide  400 mg Oral Daily  . multivitamin with minerals  1 tablet Oral Daily  . pantoprazole  40 mg Oral Daily  . polyethylene glycol  17 g Oral BID  . potassium chloride  40 mEq Oral BID  . Ensure Max Protein  11 oz Oral BID  . senna-docusate  1 tablet Oral BID  . sodium chloride flush  10-40 mL Intracatheter Q12H   Continuous Infusions: . [START ON 09/16/2019]  ceFAZolin (ANCEF) IV    . nafcillin IV 2 g (09/14/19 0557)     LOS: 18 days        Aline August,  MD [facility] 09/14/2019, 8:00 AM

## 2019-09-14 NOTE — Progress Notes (Addendum)
14 Days Post-Op Procedure(s) (LRB): LEFT STERNOCLAVICULAR WOUND DEBRIDEMENT WITH APPLICATION OF WOUND VAC (Left) Subjective: Says he had a better day yesterday with PT and is encouraged by his progress. Having less pain over all but especially in his lower back.   Objective: Vital signs in last 24 hours: Temp:  [97.3 F (36.3 C)-98.1 F (36.7 C)] 97.3 F (36.3 C) (09/15 0655) Pulse Rate:  [70-98] 70 (09/15 0655) Resp:  [16-18] 16 (09/15 0655) BP: (102-131)/(63-69) 102/66 (09/15 0655) SpO2:  [92 %-97 %] 97 % (09/15 0655)      Intake/Output from previous day: 09/14 0701 - 09/15 0700 In: 320 [P.O.:120; IV Piggyback:200] Out: 650 [Urine:650] Intake/Output this shift: No intake/output data recorded.  Physical Exam: General appearance:alert, cooperative Wound:The right chest wound vac is intact and functioning appropriately. The vac drainage is thin, serous fluid.   Lab Results: Recent Labs    09/13/19 0413  WBC 9.6  HGB 14.8  HCT 44.3  PLT 188   BMET:  Recent Labs    09/13/19 0413 09/14/19 0344  NA 134* 135  K 4.3 4.2  CL 100 101  CO2 25 24  GLUCOSE 153* 146*  BUN 17 19  CREATININE 0.98 0.97  CALCIUM 9.2 9.4    PT/INR: No results for input(s): LABPROT, INR in the last 72 hours. ABG    Component Value Date/Time   TCO2 26 03/01/2017 0258   CBG (last 3)  Recent Labs    09/13/19 1717 09/13/19 2209 09/14/19 0800  GLUCAP 157* 140* 157*    Assessment/Plan: S/P Procedure(s) (LRB): LEFT STERNOCLAVICULAR WOUND DEBRIDEMENT WITH APPLICATION OF WOUND VAC (Left)  -POD14incision and drainage of left [place] joint abscess. WillcontinueM-W-F dressing changes. Re-evaluate the wound at next vac change.  Continue IV Cefazolin and nafcillin for MSSA cultured from the wound per ID recs. -Continue PT, OT. He is very motivated to achieve PT goals and to eventually qualify for transition to inpatient rehab. Evaluation is in progress.  -We will continue to follow.   LOS:  18 days    [name] [name], [name] [name] [id number] 09/14/2019  I have seen and examined [name] [name]. and agree with the above assessment  and plan.  [name] Isaac MD Beeper [phone] Office [phone] 09/14/2019 5:17 PM

## 2019-09-14 NOTE — Progress Notes (Signed)
Nutrition Follow-up  DOCUMENTATION CODES:   Obesity unspecified  INTERVENTION:   -D/c Ensure Max -Continue MVI with minerals daily -30 ml Prostat BID, each supplement provides 100 kcals and 15 grams protein -Continue Magic cup TID with meals, each supplement provides 290 kcal and 9 grams of protein  NUTRITION DIAGNOSIS:   Increased nutrient needs related to post-op healing as evidenced by estimated needs.  Ongoing  GOAL:   Patient will meet greater than or equal to 90% of their needs  Progressing   MONITOR:   PO intake, Supplement acceptance, Labs, Weight trends, Skin, I & O's  REASON FOR ASSESSMENT:   LOS    ASSESSMENT:   [name] [name]. is a 68 y.o. male with medical history significant of OSA on CPAP; perforated appy (2015); obesity; CAD s/p stents; HLD; DM; optic nerve neuritis; and afib on Pradaxa presenting with back pain.  At the beginning of August, he saw his eye doctor and had left optic nerve inflammation (had h/o R eye issue similarly) - he was admitted to [facility] for steroids and then had them transitioned to PO for 2 weeks.  After he was discharged, he developed RLE cellulitis.  He called his PCP and was placed on antibiotic, Doxycycline.  He finished the antibiotic and steroid and around that time noticed back pain in the mid back around his waist.  He had been walking 2 miles a day, then 1, then unable due to worsening pain.  He saw orthopedics, had a negative xray.  He was offered an injection but opted for PT instead.  2 days later, the pain has escalated and he came to the ER 1 week ago.  He had a CTA and xray, discovered a small aneurysm.  He took a muscle relaxer and pain medication with mild pain control.  He is unable to walk, lies flat on his back, minimal PO intake, using a urinal due to inability to walk.  He has lost maybe 20 pounds.  The inability to walk is due to such severe pain, not because he is weak.  No night sweats.  No fevers.  No urinary  symptoms other than occasional testicular/bladder discomfort.  No urinary or fecal incontinence.  Last BM was maybe 2 weeks ago.  This AM, he noticed inability to move his arms.  Pain under clavicles and in pectoralis muscles up into neck on both sides.  8/29- MRI of lumbar spine revealed discitis/osteomyelitis at L4-5 9/1- s/p Procedure(s): LEFT STERNOCLAVICULAR WOUND DEBRIDEMENT WITH APPLICATION OF WOUND VAC (Left) 9/3- PICC placed  Reviewed I/O's: -330 ml x 24 hours and -5.6 L since 08/31/19   UOP: 650 ml x 24 hours   Pt sleeping soundly at time of visit and did arouse to voice.   Meal completion has improved since visit; PO: 20-100%. Pt is refusing Ensure Max supplements.   Per MD notes, pt has made great progress with therapies this past week. Awaiting CIR vs SNF placement.   Labs reviewed: CBGS: 140-157 (inpatient orders for glycemic control are 0-15 units insulin aspart TID with meals and 0-5 units insulin aspart q HS).   NUTRITION - FOCUSED PHYSICAL EXAM:    Most Recent Value  Orbital Region  No depletion  Upper Arm Region  No depletion  Thoracic and Lumbar Region  No depletion  Buccal Region  No depletion  Temple Region  No depletion  Clavicle Bone Region  No depletion  Clavicle and Acromion Bone Region  No depletion  Scapular Bone  Region  No depletion  Dorsal Hand  No depletion  Patellar Region  No depletion  Anterior Thigh Region  No depletion  Posterior Calf Region  No depletion  Edema (RD Assessment)  None  Hair  Reviewed  Eyes  Reviewed  Mouth  Reviewed  Skin  Reviewed  Nails  Reviewed       Diet Order:   Diet Order            Diet heart healthy/carb modified Room service appropriate? Yes with Assist; Fluid consistency: Thin  Diet effective now              EDUCATION NEEDS:   No education needs have been identified at this time  Skin:  Skin Assessment: Skin Integrity Issues: Skin Integrity Issues:: Wound VAC, Other (Comment) Wound Vac:  sternum Other: MASD to sacrum  Last BM:  09/12/19  Height:   Ht Readings from Last 1 Encounters:  08/30/19 6\' 1"  (1.854 m)    Weight:   Wt Readings from Last 1 Encounters:  09/13/19 116.3 kg    Ideal Body Weight:  83.6 kg  BMI:  Body mass index is 33.83 kg/m.  Estimated Nutritional Needs:   Kcal:  2300-2500  Protein:  125-140 grams  Fluid:  > 2.3 L    [name] [name] A. [name] [name], RD, LDN, [place] Registered Dietitian II Certified Diabetes Care and Education Specialist Pager: [phone] After hours Pager: [phone]

## 2019-09-14 NOTE — Progress Notes (Signed)
Inpatient Rehab Admissions Coordinator:   Met with pt at bedside to let him know that insurance did approve CIR.  I do not have a bed available for this pt to admit to CIR today, however.  I have let Dr. Starla Link know, and will plan for possible admit tomorrow or Thursday pending medical readiness and bed availability.   Shann Medal, PT, DPT Admissions Coordinator [phone] 09/14/19  3:01 PM

## 2019-09-15 ENCOUNTER — Other Ambulatory Visit: Payer: Self-pay

## 2019-09-15 ENCOUNTER — Inpatient Hospital Stay (HOSPITAL_COMMUNITY)
Admission: RE | Admit: 2019-09-15 | Discharge: 2019-10-02 | DRG: 057 | Disposition: A | Discharge status: Home Health | Payer: Medicare Other | Source: Intra-hospital | Attending: Physical Medicine and Rehabilitation | Admitting: Physical Medicine and Rehabilitation

## 2019-09-15 ENCOUNTER — Encounter (HOSPITAL_COMMUNITY): Payer: Self-pay | Admitting: Nurse Practitioner

## 2019-09-15 DIAGNOSIS — Z79899 Other long term (current) drug therapy: Secondary | ICD-10-CM

## 2019-09-15 DIAGNOSIS — M4646 Discitis, unspecified, lumbar region: Secondary | ICD-10-CM | POA: Diagnosis present

## 2019-09-15 DIAGNOSIS — D696 Thrombocytopenia, unspecified: Secondary | ICD-10-CM | POA: Diagnosis present

## 2019-09-15 DIAGNOSIS — E46 Unspecified protein-calorie malnutrition: Secondary | ICD-10-CM

## 2019-09-15 DIAGNOSIS — Z6832 Body mass index (BMI) 32.0-32.9, adult: Secondary | ICD-10-CM

## 2019-09-15 DIAGNOSIS — Z885 Allergy status to narcotic agent status: Secondary | ICD-10-CM

## 2019-09-15 DIAGNOSIS — R197 Diarrhea, unspecified: Secondary | ICD-10-CM | POA: Diagnosis not present

## 2019-09-15 DIAGNOSIS — I2510 Atherosclerotic heart disease of native coronary artery without angina pectoris: Secondary | ICD-10-CM | POA: Diagnosis present

## 2019-09-15 DIAGNOSIS — E440 Moderate protein-calorie malnutrition: Secondary | ICD-10-CM | POA: Diagnosis present

## 2019-09-15 DIAGNOSIS — H9190 Unspecified hearing loss, unspecified ear: Secondary | ICD-10-CM | POA: Diagnosis present

## 2019-09-15 DIAGNOSIS — E8809 Other disorders of plasma-protein metabolism, not elsewhere classified: Secondary | ICD-10-CM | POA: Diagnosis present

## 2019-09-15 DIAGNOSIS — M19012 Primary osteoarthritis, left shoulder: Secondary | ICD-10-CM | POA: Diagnosis present

## 2019-09-15 DIAGNOSIS — G8918 Other acute postprocedural pain: Secondary | ICD-10-CM

## 2019-09-15 DIAGNOSIS — E1165 Type 2 diabetes mellitus with hyperglycemia: Secondary | ICD-10-CM

## 2019-09-15 DIAGNOSIS — B9561 Methicillin susceptible Staphylococcus aureus infection as the cause of diseases classified elsewhere: Secondary | ICD-10-CM | POA: Diagnosis present

## 2019-09-15 DIAGNOSIS — M868X8 Other osteomyelitis, other site: Secondary | ICD-10-CM | POA: Diagnosis present

## 2019-09-15 DIAGNOSIS — M5104 Intervertebral disc disorders with myelopathy, thoracic region: Secondary | ICD-10-CM | POA: Diagnosis present

## 2019-09-15 DIAGNOSIS — H469 Unspecified optic neuritis: Secondary | ICD-10-CM | POA: Diagnosis present

## 2019-09-15 DIAGNOSIS — Z7984 Long term (current) use of oral hypoglycemic drugs: Secondary | ICD-10-CM

## 2019-09-15 DIAGNOSIS — M00012 Staphylococcal arthritis, left shoulder: Secondary | ICD-10-CM | POA: Diagnosis present

## 2019-09-15 DIAGNOSIS — M4645 Discitis, unspecified, thoracolumbar region: Secondary | ICD-10-CM

## 2019-09-15 DIAGNOSIS — Z8041 Family history of malignant neoplasm of ovary: Secondary | ICD-10-CM

## 2019-09-15 DIAGNOSIS — I639 Cerebral infarction, unspecified: Secondary | ICD-10-CM | POA: Diagnosis not present

## 2019-09-15 DIAGNOSIS — J439 Emphysema, unspecified: Secondary | ICD-10-CM | POA: Diagnosis present

## 2019-09-15 DIAGNOSIS — R0989 Other specified symptoms and signs involving the circulatory and respiratory systems: Secondary | ICD-10-CM | POA: Diagnosis not present

## 2019-09-15 DIAGNOSIS — E1169 Type 2 diabetes mellitus with other specified complication: Secondary | ICD-10-CM | POA: Diagnosis present

## 2019-09-15 DIAGNOSIS — G8220 Paraplegia, unspecified: Secondary | ICD-10-CM | POA: Diagnosis present

## 2019-09-15 DIAGNOSIS — I6381 Other cerebral infarction due to occlusion or stenosis of small artery: Secondary | ICD-10-CM

## 2019-09-15 DIAGNOSIS — I480 Paroxysmal atrial fibrillation: Secondary | ICD-10-CM | POA: Diagnosis present

## 2019-09-15 DIAGNOSIS — R42 Dizziness and giddiness: Secondary | ICD-10-CM | POA: Diagnosis not present

## 2019-09-15 DIAGNOSIS — M4640 Discitis, unspecified, site unspecified: Secondary | ICD-10-CM | POA: Diagnosis not present

## 2019-09-15 DIAGNOSIS — H6121 Impacted cerumen, right ear: Secondary | ICD-10-CM | POA: Diagnosis present

## 2019-09-15 DIAGNOSIS — Z9049 Acquired absence of other specified parts of digestive tract: Secondary | ICD-10-CM

## 2019-09-15 DIAGNOSIS — Z23 Encounter for immunization: Secondary | ICD-10-CM | POA: Diagnosis not present

## 2019-09-15 DIAGNOSIS — G4733 Obstructive sleep apnea (adult) (pediatric): Secondary | ICD-10-CM | POA: Diagnosis present

## 2019-09-15 DIAGNOSIS — Z888 Allergy status to other drugs, medicaments and biological substances status: Secondary | ICD-10-CM

## 2019-09-15 DIAGNOSIS — Z8349 Family history of other endocrine, nutritional and metabolic diseases: Secondary | ICD-10-CM

## 2019-09-15 DIAGNOSIS — R110 Nausea: Secondary | ICD-10-CM

## 2019-09-15 DIAGNOSIS — Z7901 Long term (current) use of anticoagulants: Secondary | ICD-10-CM

## 2019-09-15 DIAGNOSIS — Z882 Allergy status to sulfonamides status: Secondary | ICD-10-CM

## 2019-09-15 DIAGNOSIS — E876 Hypokalemia: Secondary | ICD-10-CM | POA: Diagnosis present

## 2019-09-15 DIAGNOSIS — F40240 Claustrophobia: Secondary | ICD-10-CM | POA: Diagnosis present

## 2019-09-15 DIAGNOSIS — E785 Hyperlipidemia, unspecified: Secondary | ICD-10-CM | POA: Diagnosis present

## 2019-09-15 DIAGNOSIS — I6930 Unspecified sequelae of cerebral infarction: Principal | ICD-10-CM

## 2019-09-15 DIAGNOSIS — Z09 Encounter for follow-up examination after completed treatment for conditions other than malignant neoplasm: Secondary | ICD-10-CM

## 2019-09-15 DIAGNOSIS — M797 Fibromyalgia: Secondary | ICD-10-CM | POA: Diagnosis present

## 2019-09-15 DIAGNOSIS — I4891 Unspecified atrial fibrillation: Secondary | ICD-10-CM | POA: Diagnosis present

## 2019-09-15 DIAGNOSIS — Z803 Family history of malignant neoplasm of breast: Secondary | ICD-10-CM

## 2019-09-15 DIAGNOSIS — Z801 Family history of malignant neoplasm of trachea, bronchus and lung: Secondary | ICD-10-CM

## 2019-09-15 DIAGNOSIS — K219 Gastro-esophageal reflux disease without esophagitis: Secondary | ICD-10-CM | POA: Diagnosis present

## 2019-09-15 DIAGNOSIS — M009 Pyogenic arthritis, unspecified: Secondary | ICD-10-CM | POA: Diagnosis present

## 2019-09-15 DIAGNOSIS — I252 Old myocardial infarction: Secondary | ICD-10-CM

## 2019-09-15 DIAGNOSIS — R799 Abnormal finding of blood chemistry, unspecified: Secondary | ICD-10-CM

## 2019-09-15 DIAGNOSIS — Z7951 Long term (current) use of inhaled steroids: Secondary | ICD-10-CM

## 2019-09-15 DIAGNOSIS — Z87891 Personal history of nicotine dependence: Secondary | ICD-10-CM

## 2019-09-15 DIAGNOSIS — M48061 Spinal stenosis, lumbar region without neurogenic claudication: Secondary | ICD-10-CM | POA: Diagnosis present

## 2019-09-15 DIAGNOSIS — F410 Panic disorder [episodic paroxysmal anxiety] without agoraphobia: Secondary | ICD-10-CM | POA: Diagnosis present

## 2019-09-15 DIAGNOSIS — D72828 Other elevated white blood cell count: Secondary | ICD-10-CM | POA: Diagnosis present

## 2019-09-15 DIAGNOSIS — M47812 Spondylosis without myelopathy or radiculopathy, cervical region: Secondary | ICD-10-CM | POA: Diagnosis present

## 2019-09-15 LAB — CBC WITH DIFFERENTIAL/PLATELET
Abs Immature Granulocytes: 0.03 10*3/uL (ref 0.00–0.07)
Basophils Absolute: 0 10*3/uL (ref 0.0–0.1)
Basophils Relative: 0 %
Eosinophils Absolute: 0.8 10*3/uL — ABNORMAL HIGH (ref 0.0–0.5)
Eosinophils Relative: 9 %
HCT: 41.1 % (ref 39.0–52.0)
Hemoglobin: 14.4 g/dL (ref 13.0–17.0)
Immature Granulocytes: 0 %
Lymphocytes Relative: 21 %
Lymphs Abs: 1.9 10*3/uL (ref 0.7–4.0)
MCH: 32.2 pg (ref 26.0–34.0)
MCHC: 35 g/dL (ref 30.0–36.0)
MCV: 91.9 fL (ref 80.0–100.0)
Monocytes Absolute: 0.9 10*3/uL (ref 0.1–1.0)
Monocytes Relative: 10 %
Neutro Abs: 5.3 10*3/uL (ref 1.7–7.7)
Neutrophils Relative %: 60 %
Platelets: 176 10*3/uL (ref 150–400)
RBC: 4.47 MIL/uL (ref 4.22–5.81)
RDW: 14.7 % (ref 11.5–15.5)
WBC: 8.8 10*3/uL (ref 4.0–10.5)
nRBC: 0 % (ref 0.0–0.2)

## 2019-09-15 LAB — COMPREHENSIVE METABOLIC PANEL
ALT: 32 U/L (ref 0–44)
AST: 21 U/L (ref 15–41)
Albumin: 2.4 g/dL — ABNORMAL LOW (ref 3.5–5.0)
Alkaline Phosphatase: 100 U/L (ref 38–126)
Anion gap: 7 (ref 5–15)
BUN: 15 mg/dL (ref 8–23)
CO2: 26 mmol/L (ref 22–32)
Calcium: 9.2 mg/dL (ref 8.9–10.3)
Chloride: 103 mmol/L (ref 98–111)
Creatinine, Ser: 0.92 mg/dL (ref 0.61–1.24)
GFR calc Af Amer: >60 mL/min (ref >60)
GFR calc non Af Amer: >60 mL/min (ref >60)
Glucose, Bld: 137 mg/dL — ABNORMAL HIGH (ref 70–99)
Potassium: 4.6 mmol/L (ref 3.5–5.1)
Sodium: 136 mmol/L (ref 135–145)
Total Bilirubin: 1.5 mg/dL — ABNORMAL HIGH (ref 0.3–1.2)
Total Protein: 6.3 g/dL — ABNORMAL LOW (ref 6.5–8.1)

## 2019-09-15 LAB — GLUCOSE, CAPILLARY
Glucose-Capillary: 152 mg/dL — ABNORMAL HIGH (ref 70–99)
Glucose-Capillary: 142 mg/dL — ABNORMAL HIGH (ref 70–99)
Glucose-Capillary: 101 mg/dL — ABNORMAL HIGH (ref 70–99)
Glucose-Capillary: 164 mg/dL — ABNORMAL HIGH (ref 70–99)

## 2019-09-15 LAB — MAGNESIUM: Magnesium: 1.8 mg/dL (ref 1.7–2.4)

## 2019-09-15 LAB — C-REACTIVE PROTEIN: CRP: 0.9 mg/dL (ref <1.0)

## 2019-09-15 MED ORDER — FLEET ENEMA 7-19 GM/118ML RE ENEM: 1.0000 | ENEMA | Freq: Once | RECTAL | Status: DC | PRN

## 2019-09-15 MED ORDER — ACETAMINOPHEN 325 MG PO TABS: 325.0000 mg | ORAL_TABLET | ORAL | Status: DC | PRN

## 2019-09-15 MED ORDER — MAGNESIUM OXIDE 400 (241.3 MG) MG PO TABS
400.0000 mg | ORAL_TABLET | Freq: Every day | ORAL | Status: DC
  Administered 2019-09-16 – 2019-09-17 (×2): 400 mg via ORAL
  Filled 2019-09-15 (×2): qty 1

## 2019-09-15 MED ORDER — GUAIFENESIN-DM 100-10 MG/5ML PO SYRP: 5.0000 mL | ORAL_SOLUTION | Freq: Four times a day (QID) | ORAL | Status: DC | PRN

## 2019-09-15 MED ORDER — CYCLOBENZAPRINE HCL 10 MG PO TABS
10.0000 mg | ORAL_TABLET | Freq: Three times a day (TID) | ORAL | Status: DC | PRN
  Administered 2019-09-15: 10 mg via ORAL
  Filled 2019-09-15: qty 1

## 2019-09-15 MED ORDER — DOFETILIDE 250 MCG PO CAPS
500.0000 ug | ORAL_CAPSULE | Freq: Two times a day (BID) | ORAL | Status: DC
  Administered 2019-09-15 – 2019-10-02 (×34): 500 ug via ORAL
  Filled 2019-09-15 (×2): qty 1
  Filled 2019-09-15: qty 2
  Filled 2019-09-15 (×5): qty 1
  Filled 2019-09-15 (×2): qty 2
  Filled 2019-09-15 (×2): qty 1
  Filled 2019-09-15: qty 2
  Filled 2019-09-15 (×10): qty 1
  Filled 2019-09-15 (×2): qty 2
  Filled 2019-09-15 (×4): qty 1
  Filled 2019-09-15: qty 2
  Filled 2019-09-15: qty 1
  Filled 2019-09-15: qty 2
  Filled 2019-09-15: qty 1
  Filled 2019-09-15: qty 2
  Filled 2019-09-15 (×2): qty 1
  Filled 2019-09-15: qty 2

## 2019-09-15 MED ORDER — DABIGATRAN ETEXILATE MESYLATE 150 MG PO CAPS
150.0000 mg | ORAL_CAPSULE | Freq: Two times a day (BID) | ORAL | Status: DC
  Administered 2019-09-15 – 2019-10-02 (×34): 150 mg via ORAL
  Filled 2019-09-15 (×37): qty 1

## 2019-09-15 MED ORDER — PRO-STAT SUGAR FREE PO LIQD: 30.0000 mL | Freq: Two times a day (BID) | ORAL | Status: DC

## 2019-09-15 MED ORDER — PRO-STAT SUGAR FREE PO LIQD
30.0000 mL | Freq: Two times a day (BID) | ORAL | Status: DC
  Administered 2019-09-15 – 2019-10-02 (×34): 30 mL via ORAL
  Filled 2019-09-15 (×34): qty 30

## 2019-09-15 MED ORDER — ALPRAZOLAM 0.5 MG PO TABS
0.5000 mg | ORAL_TABLET | Freq: Three times a day (TID) | ORAL | Status: DC | PRN
  Administered 2019-09-18 – 2019-10-01 (×7): 0.5 mg via ORAL
  Filled 2019-09-15 (×7): qty 1

## 2019-09-15 MED ORDER — ONDANSETRON HCL 4 MG PO TABS
4.0000 mg | ORAL_TABLET | Freq: Four times a day (QID) | ORAL | Status: DC | PRN
  Administered 2019-09-16 – 2019-09-17 (×2): 4 mg via ORAL
  Filled 2019-09-15 (×2): qty 1

## 2019-09-15 MED ORDER — POTASSIUM CHLORIDE CRYS ER 20 MEQ PO TBCR
40.0000 meq | EXTENDED_RELEASE_TABLET | Freq: Two times a day (BID) | ORAL | Status: DC
  Administered 2019-09-15 – 2019-09-17 (×4): 40 meq via ORAL
  Filled 2019-09-15 (×4): qty 2

## 2019-09-15 MED ORDER — PROCHLORPERAZINE MALEATE 5 MG PO TABS
5.0000 mg | ORAL_TABLET | Freq: Four times a day (QID) | ORAL | Status: DC | PRN
  Administered 2019-09-18 – 2019-09-26 (×3): 5 mg via ORAL
  Filled 2019-09-15 (×3): qty 1

## 2019-09-15 MED ORDER — TRAZODONE HCL 50 MG PO TABS
25.0000 mg | ORAL_TABLET | Freq: Every evening | ORAL | Status: DC | PRN
  Administered 2019-09-15 – 2019-10-01 (×14): 50 mg via ORAL
  Filled 2019-09-15 (×16): qty 1

## 2019-09-15 MED ORDER — ALUM & MAG HYDROXIDE-SIMETH 200-200-20 MG/5ML PO SUSP: 30.0000 mL | Freq: Four times a day (QID) | ORAL | Status: DC | PRN

## 2019-09-15 MED ORDER — ONDANSETRON HCL 4 MG/2ML IJ SOLN: 4.0000 mg | Freq: Four times a day (QID) | INTRAMUSCULAR | Status: DC | PRN

## 2019-09-15 MED ORDER — TRAMADOL HCL 50 MG PO TABS
50.0000 mg | ORAL_TABLET | Freq: Four times a day (QID) | ORAL | Status: DC | PRN
  Administered 2019-09-17 – 2019-10-02 (×18): 50 mg via ORAL
  Filled 2019-09-15 (×20): qty 1

## 2019-09-15 MED ORDER — POLYETHYLENE GLYCOL 3350 17 G PO PACK
17.0000 g | PACK | Freq: Two times a day (BID) | ORAL | Status: DC
  Administered 2019-09-15: 17 g via ORAL
  Filled 2019-09-15 (×4): qty 1

## 2019-09-15 MED ORDER — BRIMONIDINE TARTRATE 0.15 % OP SOLN
1.0000 [drp] | Freq: Three times a day (TID) | OPHTHALMIC | Status: DC
  Administered 2019-09-15 – 2019-10-02 (×50): 1 [drp] via OPHTHALMIC
  Filled 2019-09-15: qty 5

## 2019-09-15 MED ORDER — ALUM & MAG HYDROXIDE-SIMETH 200-200-20 MG/5ML PO SUSP
30.0000 mL | ORAL | Status: DC | PRN
  Administered 2019-09-20: 30 mL via ORAL
  Filled 2019-09-15: qty 30

## 2019-09-15 MED ORDER — PANTOPRAZOLE SODIUM 40 MG PO TBEC
40.0000 mg | DELAYED_RELEASE_TABLET | Freq: Every day | ORAL | Status: DC
  Administered 2019-09-16 – 2019-10-02 (×17): 40 mg via ORAL
  Filled 2019-09-15 (×17): qty 1

## 2019-09-15 MED ORDER — SODIUM CHLORIDE 0.9% FLUSH
10.0000 mL | INTRAVENOUS | Status: DC | PRN
  Administered 2019-09-21 – 2019-10-02 (×4): 10 mL
  Filled 2019-09-15 (×4): qty 40

## 2019-09-15 MED ORDER — LORATADINE 10 MG PO TABS
10.0000 mg | ORAL_TABLET | Freq: Every day | ORAL | Status: DC
  Administered 2019-09-16 – 2019-10-02 (×17): 10 mg via ORAL
  Filled 2019-09-15 (×17): qty 1

## 2019-09-15 MED ORDER — SENNOSIDES-DOCUSATE SODIUM 8.6-50 MG PO TABS
1.0000 | ORAL_TABLET | Freq: Two times a day (BID) | ORAL | Status: DC
  Administered 2019-09-15 – 2019-10-02 (×34): 1 via ORAL
  Filled 2019-09-15 (×34): qty 1

## 2019-09-15 MED ORDER — SODIUM CHLORIDE 0.9% FLUSH
10.0000 mL | Freq: Two times a day (BID) | INTRAVENOUS | Status: DC
  Administered 2019-09-17 – 2019-10-02 (×12): 10 mL

## 2019-09-15 MED ORDER — BISACODYL 10 MG RE SUPP: 10.0000 mg | Freq: Every day | RECTAL | Status: DC | PRN

## 2019-09-15 MED ORDER — INSULIN ASPART 100 UNIT/ML ~~LOC~~ SOLN
0.0000 [IU] | Freq: Three times a day (TID) | SUBCUTANEOUS | Status: DC
  Administered 2019-09-16 (×3): 2 [IU] via SUBCUTANEOUS
  Administered 2019-09-17: 3 [IU] via SUBCUTANEOUS
  Administered 2019-09-17 – 2019-09-21 (×11): 2 [IU] via SUBCUTANEOUS
  Administered 2019-09-22: 3 [IU] via SUBCUTANEOUS
  Administered 2019-09-22 – 2019-09-23 (×2): 2 [IU] via SUBCUTANEOUS
  Administered 2019-09-23 – 2019-09-24 (×2): 3 [IU] via SUBCUTANEOUS
  Administered 2019-09-24 – 2019-09-25 (×2): 2 [IU] via SUBCUTANEOUS
  Administered 2019-09-25 – 2019-09-26 (×2): 3 [IU] via SUBCUTANEOUS
  Administered 2019-09-26: 2 [IU] via SUBCUTANEOUS
  Administered 2019-09-27 (×2): 3 [IU] via SUBCUTANEOUS

## 2019-09-15 MED ORDER — CEFAZOLIN SODIUM-DEXTROSE 2-4 GM/100ML-% IV SOLN
2.0000 g | Freq: Three times a day (TID) | INTRAVENOUS | Status: DC
  Administered 2019-09-16 – 2019-10-02 (×48): 2 g via INTRAVENOUS
  Filled 2019-09-15 (×53): qty 100

## 2019-09-15 MED ORDER — ALBUTEROL SULFATE (2.5 MG/3ML) 0.083% IN NEBU
2.5000 mg | INHALATION_SOLUTION | RESPIRATORY_TRACT | Status: DC | PRN
  Filled 2019-09-15: qty 3

## 2019-09-15 MED ORDER — POLYETHYLENE GLYCOL 3350 17 G PO PACK: 17.0000 g | PACK | Freq: Every day | ORAL | Status: DC | PRN

## 2019-09-15 MED ORDER — TRAZODONE HCL 50 MG PO TABS: 50.0000 mg | ORAL_TABLET | Freq: Every evening | ORAL | Status: DC | PRN

## 2019-09-15 MED ORDER — FLUTICASONE FUROATE-VILANTEROL 200-25 MCG/INH IN AEPB
1.0000 | INHALATION_SPRAY | Freq: Every day | RESPIRATORY_TRACT | Status: DC
  Administered 2019-09-16 – 2019-10-02 (×17): 1 via RESPIRATORY_TRACT
  Filled 2019-09-15 (×3): qty 28

## 2019-09-15 MED ORDER — ENSURE ENLIVE PO LIQD: 237.0000 mL | Freq: Two times a day (BID) | ORAL | Status: DC

## 2019-09-15 MED ORDER — ADULT MULTIVITAMIN W/MINERALS CH
1.0000 | ORAL_TABLET | Freq: Every day | ORAL | Status: DC
  Administered 2019-09-16 – 2019-09-17 (×2): 1 via ORAL
  Filled 2019-09-15 (×2): qty 1

## 2019-09-15 MED ORDER — ATORVASTATIN CALCIUM 80 MG PO TABS
80.0000 mg | ORAL_TABLET | Freq: Every day | ORAL | Status: DC
  Administered 2019-09-15 – 2019-10-01 (×17): 80 mg via ORAL
  Filled 2019-09-15 (×8): qty 1
  Filled 2019-09-15: qty 2
  Filled 2019-09-15 (×8): qty 1

## 2019-09-15 MED ORDER — INSULIN ASPART 100 UNIT/ML ~~LOC~~ SOLN: 0.0000 [IU] | Freq: Every day | SUBCUTANEOUS | Status: DC

## 2019-09-15 MED ORDER — SODIUM CHLORIDE 0.9 % IV SOLN
2.0000 g | INTRAVENOUS | Status: AC
  Administered 2019-09-15 (×2): 2 g via INTRAVENOUS
  Filled 2019-09-15 (×2): qty 2000

## 2019-09-15 MED ORDER — CHLORHEXIDINE GLUCONATE CLOTH 2 % EX PADS
6.0000 | MEDICATED_PAD | Freq: Every day | CUTANEOUS | Status: DC
  Administered 2019-09-15 – 2019-09-16 (×2): 6 via TOPICAL

## 2019-09-15 MED ORDER — PROCHLORPERAZINE EDISYLATE 10 MG/2ML IJ SOLN: 5.0000 mg | Freq: Four times a day (QID) | INTRAMUSCULAR | Status: DC | PRN

## 2019-09-15 MED ORDER — MORPHINE SULFATE (PF) 2 MG/ML IV SOLN: 2.0000 mg | INTRAVENOUS | Status: DC | PRN

## 2019-09-15 MED ORDER — LIDOCAINE 5 % EX PTCH
1.0000 | MEDICATED_PATCH | CUTANEOUS | Status: DC
  Administered 2019-09-16: 18:00:00 1 via TRANSDERMAL
  Filled 2019-09-15: qty 1

## 2019-09-15 MED ORDER — PROCHLORPERAZINE 25 MG RE SUPP: 12.5000 mg | Freq: Four times a day (QID) | RECTAL | Status: DC | PRN

## 2019-09-15 MED ORDER — OXYCODONE-ACETAMINOPHEN 5-325 MG PO TABS
1.0000 | ORAL_TABLET | ORAL | Status: DC | PRN
  Administered 2019-09-15 (×2): 2 via ORAL
  Administered 2019-09-16: 13:00:00 1 via ORAL
  Administered 2019-09-16: 06:00:00 2 via ORAL
  Administered 2019-09-16 – 2019-09-17 (×3): 1 via ORAL
  Administered 2019-09-19: 2 via ORAL
  Filled 2019-09-15 (×5): qty 2
  Filled 2019-09-15 (×3): qty 1
  Filled 2019-09-15: qty 2

## 2019-09-15 MED ORDER — DIPHENHYDRAMINE HCL 12.5 MG/5ML PO ELIX: 12.5000 mg | ORAL_SOLUTION | Freq: Four times a day (QID) | ORAL | Status: DC | PRN

## 2019-09-15 NOTE — Progress Notes (Signed)
Inpatient Rehab Admissions Coordinator:   I have insurance authorization and a bed available for pt to admit to CIR today. I have paged Dr. Barth Kirks to confirm he can admit, and I will let pt/family, RN, and CSW know.   Shann Medal, PT, DPT Admissions Coordinator [phone] 09/15/19  11:26 AM

## 2019-09-15 NOTE — Progress Notes (Signed)
Approx 20 mL sangineous output in wound vac overnight.

## 2019-09-15 NOTE — Discharge Summary (Signed)
Physician Discharge Summary  [name]. [id number] DOB: [birth year] DOA: 08/27/2019  PCP: Burnis Medin, MD  Admit date: 08/27/2019 Discharge date: 09/15/2019  Admitted From: Home Disposition: Inpatient rehab Recommendations for Outpatient Follow-up:  1. Follow up with PCP in 1-2 weeks 2. Please obtain BMP/CBC in one week 3. Please follow up on the following pending results:  Discharge Condition: Stable CODE STATUS: Full  Brief/Interim Summary: Patient is a 68 year old male with history of OSA on CPAP, perforated appendicitis in 2015, CAD status post stents, hyperlipidemia, diabetes mellitus type 2, optic neuritis with recent hospitalization at [facility] treated with intravenous and subsequent oral steroids, recent right lower extremity cellulitis treated as an outpatient with oral doxycycline, A. fib on Pradaxa presented with back pain and progressively worsening difficulty walking. Neurology was consulted. He was found to have MSSA bacteremia with question of L4-L5 discitis/osteomyelitis. He was started on Ancef. ID was consulted.  Subsequently, he was found to have infected left sternoclavicular joint.  CT surgery was consulted.  He underwent I&D with application of wound VAC by CT surgery.  ID recommends nafcillin for 14 days till 09/15/2019 then change to cefazolin on 09/16/2019 through 10/12/2019.  MSSA bacteremia Question of L4-L5 discitis/osteomyelitis -Repeat blood cultures from 08/29/2019 have been negative so far. Echo showed no valvular vegetations. Spoke to neurosurgery/Dr. Annette Stable on phone on 08/28/2019 and he reviewed the MRI lumbar spine and recommended no surgical intervention at this time. -ID recommended nafcillin for 14 days till 09/15/2019 then changed to cefazolin on 09/16/2019 through 10/12/2019. -Currently afebrile and hemodynamically stable.  Infected left sternoclavicular joint status post I&D with application of wound VAC by CT surgery -Surgical/deep tissue  culture grew staph aureus - Seen by CT surgery.  Wound care as per CT surgery recommendations.  Antibiotic plan as above.  Left shoulder pain -Improving. 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  -MRI of the cervical and thoracic spine showed degenerative changes without cord signal abnormality -MRI of the brain showed question of punctate acute infarctions.   Neurology was not convinced about this for now. CTA of the head and neck showed no evidence of CNS vasculitis -Neurology has signed off.  Leukocytosis -Resolved.  Thrombocytopenia -Questionable cause.   Resolved.  Dehydration Acute kidney injury -Resolved.  Off IV fluids.  Paroxysmal A. fib on Pradaxa -Continue Tikosyn.Check potassium and magnesium.  Replace as needed. -Continue Pradaxa.  Diabetes mellitus type 2 -A1c 6.7 on 07/21/2019 -Glucophage held -Continue CBGs with SSI  Hyperlipidemia -Continue Lipitor  OSA -Continue CPAP at bedside  Obesity -Outpatient follow-up  Generalized deconditioning/weakness-patient being discharged to CIR. -  Medications will be continued.  Discharge Diagnoses:  Principal Problem:   MSSA bacteremia Active Problems:   Hyperlipidemia   Obstructive sleep apnea   Type 2 diabetes mellitus with other circulatory complications (HCC)   Atrial fibrillation (HCC)   Myelopathy (HCC)   H/O optic neuritis   Discitis of lumbar region   Left shoulder pain   Septic arthritis of sternoclavicular joint, left ([place])   Suspected infectious  meningitis    Discharge Instructions Patient being discharged to inpatient rehab.  Answered his questions appropriately to the best of my knowledge.  He verbalized understanding of the discharge plan of care.    Follow-up [facility]. Go on 10/12/2019.   Specialty: Infectious Diseases Why: Appointment with Colletta Maryland NP on 2:30 pm  Contact information: [place], [place] [id number] [place] [phone]         Allergies  Allergen Reactions  . Novocain [Procaine] Hives    Dentist appointment in 1968; since then has tolerated lidocaine and provocaine with no hives or difficulty.  . Pseudoephedrine Other (See Comments)    Patient went into afib  . Quinolones     Patient was warned about not using Cipro and similar antibiotics. Recent studies have raised concern that fluoroquinolone antibiotics could be associated with an increased risk of aortic aneurysm Fluoroquinolones have non-antimicrobial properties that might jeopardise the integrity of the extracellular matrix of the vascular wall In a  propensity score matched cohort study in [place], there was a 66% increased rate of aortic aneurysm or dissection associated with oral fluoroquinolone use, compared wit  . Sulfonamide Derivatives     Childhood reaction   . Cardizem [Diltiazem] Rash    Also 2019  . 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 EXAM: CT ANGIOGRAPHY HEAD AND NECK TECHNIQUE: Multidetector CT imaging of the head and neck was performed using the standard protocol during bolus administration of intravenous contrast. Multiplanar CT image reconstructions and MIPs were obtained to evaluate the vascular anatomy. Carotid stenosis measurements  (when applicable) are obtained utilizing NASCET criteria, using the distal internal carotid diameter as the denominator. CONTRAST:  47mL OMNIPAQUE IOHEXOL 350 MG/ML SOLN COMPARISON:  MRI yesterday. FINDINGS: CT HEAD FINDINGS Brain: Minimal chronic small-vessel changes of the white matter. No sign of acute infarction, mass lesion, hemorrhage, hydrocephalus or extra-axial collection. Vascular: Negative Skull: Normal Sinuses: Clear. Right posterior nasopharyngeal polyp or mass again seen. Orbits: Normal Review of the MIP images confirms the above findings CTA NECK FINDINGS Aortic arch: Aortic atherosclerosis. Ectatic ascending aorta with maximal diameter of 4.2 cm. Right carotid system: Common carotid artery widely patent to the bifurcation. Calcified plaque at the carotid bifurcation and ICA bulb. No stenosis. Cervical ICA widely patent beyond that. Left carotid system: Common carotid artery widely patent to the bifurcation. Calcified plaque at the ICA bulb but no stenosis. Cervical ICA normal beyond that. Vertebral arteries: Atherosclerotic plaque at the proximal right subclavian artery but no stenosis. Right vertebral artery origin widely patent. Right vertebral artery widely patent through the cervical region to the foramen magnum. Atherosclerotic disease at the left subclavian artery origin with 50% stenosis. Left vertebral artery origin widely patent. Non dominant left vertebral artery widely patent through the cervical region to the foramen magnum. Skeleton: Ordinary cervical spondylosis. Other neck: Right posterior nasopharyngeal polyp or mass as previously noted. No other neck finding. Upper chest: Scarring and emphysema in the upper lobes. Review of the MIP images confirms the above findings CTA HEAD FINDINGS Anterior circulation: Both internal carotid arteries are patent through the skull base and siphon regions. Ordinary siphon atherosclerotic calcification without stenosis. The anterior and middle cerebral  vessels are normal without proximal stenosis, aneurysm or vascular malformation. No large or medium vessel occlusion. No sign of gross distal vessel irregularity. Posterior circulation: Both vertebral arteries are patent at the foramen magnum level.  There is atherosclerotic calcification in the right V4 segment with stenosis of 30-50%. Small left vertebral artery terminates in PICA. Basilar artery is tortuous but does not show focal stenosis. Superior cerebellar and posterior cerebral arteries are patent. Posterior cerebral arteries receive most of there supply from patent posterior communicating arteries. No distal vessel irregularity is appreciable. Venous sinuses: Patent and normal. Anatomic variants: None significant otherwise. Review of the MIP images confirms the above findings IMPRESSION: No gross evidence of CSF vasculitis. Nonstenotic atherosclerotic change at both carotid bifurcations. 50% stenosis of the subclavian artery origin on the left. 30-50% stenosis of the right vertebral artery V4 segment. No posterior circulation large or medium vessel occlusion. Posterior cerebral arteries receive most of there supply from the anterior circulation. Ectatic ascending aorta, diameter 4.2 cm. Recommend annual imaging followup by CTA or MRA. This recommendation follows 2010 ACCF/AHA/AATS/ACR/ASA/SCA/SCAI/SIR/STS/SVM Guidelines for the Diagnosis and Management of Patients with Thoracic Aortic Disease. Circulation. 2010; [id number]. Aortic aneurysm NOS (ICD10-I71.9) Electronically Signed   By: Nelson Chimes M.D.   On: 08/29/2019 12:01   Dg Chest 2 View  Result Date: 08/19/2019 CLINICAL DATA:  Chest pain EXAM: CHEST - 2 VIEW COMPARISON:  April 19, 2018 FINDINGS: The heart size is enlarged but grossly stable when compared to prior study. Aortic calcifications are noted. There is no pneumothorax. No infiltrate. Again noted are findings suggestive of underlying interstitial lung disease, better appreciated on  prior CT from 02/03/2019. There is no significant pleural effusion. There is no acute osseous abnormality. IMPRESSION: No active cardiopulmonary disease. Electronically Signed   By: Constance Holster M.D.   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 EXAM: CT ANGIOGRAPHY HEAD AND NECK TECHNIQUE: Multidetector CT imaging of the head and neck was performed using the standard protocol during bolus administration of intravenous contrast. Multiplanar CT image reconstructions and MIPs were obtained to evaluate the vascular anatomy. Carotid stenosis measurements (when applicable) are obtained utilizing NASCET criteria, using the distal internal carotid diameter as the denominator. CONTRAST:  67mL OMNIPAQUE IOHEXOL 350 MG/ML SOLN COMPARISON:  MRI yesterday. FINDINGS: CT HEAD FINDINGS Brain: Minimal chronic small-vessel changes of the white matter. No sign of acute infarction, mass lesion, hemorrhage, hydrocephalus or extra-axial collection. Vascular: Negative Skull: Normal Sinuses: Clear. Right posterior nasopharyngeal polyp or mass again seen. Orbits: Normal Review of the MIP images confirms the above findings CTA NECK FINDINGS Aortic arch: Aortic atherosclerosis. Ectatic ascending aorta with maximal diameter of 4.2 cm. Right carotid system: Common carotid artery widely patent to the bifurcation. Calcified plaque at the carotid bifurcation and ICA bulb. No stenosis. Cervical ICA widely patent beyond that. Left carotid system: Common carotid artery widely patent to the bifurcation. Calcified plaque at the ICA bulb but no stenosis. Cervical ICA normal beyond that. Vertebral arteries: Atherosclerotic plaque at the proximal right subclavian artery but no stenosis. Right vertebral artery origin widely patent. Right vertebral artery widely patent through the cervical region  to the foramen magnum. Atherosclerotic disease at the left subclavian artery origin with 50% stenosis. Left vertebral artery origin widely patent. Non dominant left vertebral artery widely patent through the cervical region to the foramen magnum. Skeleton: Ordinary cervical spondylosis. Other neck: Right posterior nasopharyngeal polyp or mass as previously noted. No other neck finding. Upper chest: Scarring and emphysema in the upper lobes. Review of the MIP images confirms  the above findings CTA HEAD FINDINGS Anterior circulation: Both internal carotid arteries are patent through the skull base and siphon regions. Ordinary siphon atherosclerotic calcification without stenosis. The anterior and middle cerebral vessels are normal without proximal stenosis, aneurysm or vascular malformation. No large or medium vessel occlusion. No sign of gross distal vessel irregularity. Posterior circulation: Both vertebral arteries are patent at the foramen magnum level. There is atherosclerotic calcification in the right V4 segment with stenosis of 30-50%. Small left vertebral artery terminates in PICA. Basilar artery is tortuous but does not show focal stenosis. Superior cerebellar and posterior cerebral arteries are patent. Posterior cerebral arteries receive most of there supply from patent posterior communicating arteries. No distal vessel irregularity is appreciable. Venous sinuses: Patent and normal. Anatomic variants: None significant otherwise. Review of the MIP images confirms the above findings IMPRESSION: No gross evidence of CSF vasculitis. Nonstenotic atherosclerotic change at both carotid bifurcations. 50% stenosis of the subclavian artery origin on the left. 30-50% stenosis of the right vertebral artery V4 segment. No posterior circulation large or medium vessel occlusion. Posterior cerebral arteries receive most of there supply from the anterior circulation. Ectatic ascending aorta, diameter 4.2 cm. Recommend  annual imaging followup by CTA or MRA. This recommendation follows 2010 ACCF/AHA/AATS/ACR/ASA/SCA/SCAI/SIR/STS/SVM Guidelines for the Diagnosis and Management of Patients with Thoracic Aortic Disease. Circulation. 2010; [id number]. Aortic aneurysm NOS (ICD10-I71.9) Electronically Signed   By: Nelson Chimes M.D.   On: 08/29/2019 12:01   Mr Jeri Cos [id number] Contrast  Result Date: 08/28/2019 CLINICAL DATA:  Worsening low back pain and weakness. EXAM: MRI HEAD WITHOUT AND WITH CONTRAST TECHNIQUE: Multiplanar, multiecho pulse sequences of the brain and surrounding structures were obtained without and with intravenous contrast. CONTRAST:  10 cc Gadavist COMPARISON:  06/05/2018 FINDINGS: Brain: Diffusion imaging shows a few punctate foci of restricted diffusion, 2 in the right thalamus in 1 in the splenium of the corpus callosum to the left of midline. These could be due to micro embolic infarctions in the posterior circulation. The brainstem is normal. Few old small vessel cerebellar infarctions. Cerebral hemispheres show mild chronic small-vessel ischemic change of the deep white matter. No mass lesion, hemorrhage, hydrocephalus or extra-axial collection. After contrast administration, no abnormal enhancement occurs. Vascular: Major vessels at the base of the brain show flow. Skull and upper cervical spine: Negative Sinuses/Orbits: Clear/normal Other: Question presence of a posterior nasopharyngeal mass on the right. Suggest ENT consultation. This could be a benign polyp or a malignancy. 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 These could be due to micro embolic infarctions in the posterior circulation. Mild chronic small-vessel ischemic changes of the brain elsewhere as seen previously. Probable posterior nasopharyngeal polyp or mass lesion on the right. ENT referral suggested at some point. Electronically Signed   By: Nelson Chimes M.D.   On:  08/28/2019 07:00   Mr Cervical Spine W Wo Contrast  Result Date: 08/28/2019 CLINICAL DATA:  Weakness.  Myelopathy. EXAM: MRI CERVICAL SPINE WITHOUT AND WITH CONTRAST TECHNIQUE: Multiplanar and multiecho pulse sequences of the cervical spine, to include the craniocervical junction and cervicothoracic junction, were obtained without and with intravenous contrast. CONTRAST:  10 cc MultiHance COMPARISON:  None. FINDINGS: Alignment: Mild straightening of the normal cervical lordosis. Vertebrae: No fracture or primary bone lesion. Cord: No cord compression or primary cord lesion. Posterior Fossa, vertebral arteries, paraspinal tissues: See results of brain MRI. Disc levels: No significant finding at the  foramen magnum, C1-2 or C2-3. C3-4: Spondylosis without compressive central canal stenosis. Foraminal narrowing left worse than right. This could affect the left C4 nerve. C4-5: Spondylosis with canal narrowing and effacement of the subarachnoid space surrounding the cord. AP diameter of the canal 10 mm. No cord compression. Bilateral foraminal narrowing that could affect either C5 nerve. C5-6: Spondylosis with narrowing the canal but no cord compression. AP diameter 1 cm. Bilateral foraminal narrowing that could affect either C6 nerve. C6-7: Spondylosis with narrowing of the ventral subarachnoid space. AP diameter of the canal 11 mm. Bilateral foraminal narrowing that could affect either C7 nerve. C7-T1: Facet osteoarthritis on the left. No canal stenosis. Mild left foraminal stenosis. IMPRESSION: No primary cord lesion or cord compression. Ordinary cervical spondylosis. No compressive central canal stenosis. On the left at C3-4, bilateral at C4-5, bilateral at C5-6, bilateral at C6-7 and on the left at C7-T1. Electronically Signed   By: Nelson Chimes M.D.   On: 08/28/2019 07:04   Mr Thoracic Spine W Wo Contrast  Addendum Date: 08/28/2019   ADDENDUM REPORT: 08/28/2019 13:29 ADDENDUM: Additional clinical history has  been provided. The patient has MSSA bacteremia. 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 These results were discussed by telephone on 08/28/2019 at 1:00 pm to Dr. Aline August, who verbally acknowledged these results. Electronically Signed   By: Titus Dubin M.D.   On: 08/28/2019 13:29   Result Date: 08/28/2019 CLINICAL DATA:  Worsening back pain with recent abdominal aortic aneurysm diagnosis. Difficulty getting out of bed. EXAM: MRI THORACIC AND LUMBAR SPINE WITHOUT AND WITH CONTRAST TECHNIQUE: Multiplanar and multiecho pulse sequences of the thoracic and lumbar spine were obtained without and with intravenous contrast. CONTRAST:  10 mL Gadavist COMPARISON:  None. FINDINGS: MRI THORACIC SPINE FINDINGS Alignment:  Physiologic. Vertebrae: No fracture, evidence of discitis, or bone lesion. Cord:  Normal signal and morphology. Paraspinal and other soft tissues: Bilateral dependent atelectasis. Disc levels: T8-9: Small central disc protrusion without stenosis. T9-10: Small right subarticular disc protrusion without stenosis. T11-12: Left subarticular disc protrusion without stenosis. MRI LUMBAR SPINE FINDINGS Segmentation:  Standard. Alignment:  Physiologic. Vertebrae: Hyperintense T2-weighted signal with contrast enhancement at L4-5, likely degenerative. Conus medullaris: Extends to the L2 level and appears normal. Paraspinal and other soft tissues: Negative Disc levels: L1-2: No disc herniation or stenosis. L2-3: Intermediate disc bulge with mild spinal canal stenosis. Mild right foraminal stenosis. L3-4: Right eccentric disc bulge with narrowing of the right lateral recess and mild right foraminal stenosis. L4-5: Mild disc edema with left-greater-than-right facet arthrosis. Small amount of fluid in the left facet. Left-greater-than-right lateral recess stenosis. Severe left and moderate right foraminal  stenosis. L5-S1: Intermediate disc bulge with endplate spurring. Moderate bilateral neural foraminal stenosis. No central spinal canal stenosis. IMPRESSION: 1. No spinal cord or cauda equina abnormality. 2. Signal changes and contrast enhancement of the bone marrow at L4-5, favored to be degenerative. No height loss or endplate erosion. 3. Multilevel moderate-to-severe lumbar neural foraminal stenosis. 4. 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 Electronically Signed: By: Ulyses Jarred M.D. On: 08/28/2019 06:50   Mr Lumbar Spine W Wo Contrast  Addendum Date: 08/28/2019   ADDENDUM REPORT: 08/28/2019 13:29 ADDENDUM: Additional clinical history has been provided. The patient has MSSA bacteremia. 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 These results were discussed  by telephone on 08/28/2019 at 1:00 pm to Dr. Aline August, who verbally acknowledged these results. Electronically Signed   By: Titus Dubin M.D.   On: 08/28/2019 13:29   Result Date: 08/28/2019 CLINICAL DATA:  Worsening back pain with recent abdominal aortic aneurysm diagnosis. Difficulty getting out of bed. EXAM: MRI THORACIC AND LUMBAR SPINE WITHOUT AND WITH CONTRAST TECHNIQUE: Multiplanar and multiecho pulse sequences of the thoracic and lumbar spine were obtained without and with intravenous contrast. CONTRAST:  10 mL Gadavist COMPARISON:  None. FINDINGS: MRI THORACIC SPINE FINDINGS Alignment:  Physiologic. Vertebrae: No fracture, evidence of discitis, or bone lesion. Cord:  Normal signal and morphology. Paraspinal and other soft tissues: Bilateral dependent atelectasis. Disc levels: T8-9: Small central disc protrusion without stenosis. T9-10: Small right subarticular disc protrusion without stenosis. T11-12: Left subarticular disc protrusion without stenosis. MRI LUMBAR SPINE FINDINGS Segmentation:  Standard. Alignment:   Physiologic. Vertebrae: Hyperintense T2-weighted signal with contrast enhancement at L4-5, likely degenerative. Conus medullaris: Extends to the L2 level and appears normal. Paraspinal and other soft tissues: Negative Disc levels: L1-2: No disc herniation or stenosis. L2-3: Intermediate disc bulge with mild spinal canal stenosis. Mild right foraminal stenosis. L3-4: Right eccentric disc bulge with narrowing of the right lateral recess and mild right foraminal stenosis. L4-5: Mild disc edema with left-greater-than-right facet arthrosis. Small amount of fluid in the left facet. Left-greater-than-right lateral recess stenosis. Severe left and moderate right foraminal stenosis. L5-S1: Intermediate disc bulge with endplate spurring. Moderate bilateral neural foraminal stenosis. No central spinal canal stenosis. IMPRESSION: 1. No spinal cord or cauda equina abnormality. 2. Signal changes and contrast enhancement of the bone marrow at L4-5, favored to be degenerative. No height loss or endplate erosion. 3. Multilevel moderate-to-severe lumbar neural foraminal stenosis. 4. 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 Electronically Signed: By: Ulyses Jarred M.D. On: 08/28/2019 06:50   Mr Sternum Wo Contrast  Addendum Date: 08/31/2019   ADDENDUM REPORT: 08/31/2019 11:31 ADDENDUM: Within the findings under bone/joint/cartilage section they should read that there is cortical irregularity seen at the LEFT distal clavicle at the sternoclavicular joint with joint fluid within the LEFT sternoclavicular joint. There is also mild joint space loss seen at the right sternoclavicular joint. Electronically Signed   By: Prudencio Pair M.D.   On: 08/31/2019 11:31   Result Date: 08/31/2019 CLINICAL DATA:  Question of dislocation of the sternoclavicular joint and suspected infection. EXAM: MR STERNUM WITHOUT CONTRAST TECHNIQUE: Multiplanar, multisequence MR imaging of the sternum was performed. No intravenous contrast was  administered. COMPARISON:  CT August 29, 2019 FINDINGS: Limited due to patient motion and technique. Bones/Joint/Cartilage There is cortical irregularity seen at the right distal clavicle at the sternoclavicular joint with subtle T1 hypointensity and T2 hyperintensity involving the articular surface of the distal clavicle. There is joint fluid seen within the right sternoclavicular joint. Significant surrounding soft tissue edema is noted. No fracture or dislocation is noted. There is mild joint space loss seen at the left sternoclavicular joint with marginal osteophyte formation. Ligaments Suboptimal due to technique. Muscles and Tendons Fe[place] increased STIR signal seen along the left pectoralis musculature anteriorly. Limited visualization of the tendons is seen. Soft tissues Significant soft tissue stranding and edema is seen along the left sternoclavicular joint and chest wall extending anteriorly. No loculated fluid collection is noted within the soft tissues. 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 Significant surrounding soft tissue edema and left pectoralis muscular edema No  definite sternoclavicular dislocation. These results will be called to the ordering clinician or representative by the Radiologist Assistant, and communication documented in the PACS or zVision Dashboard. Electronically Signed: By: Prudencio Pair M.D. On: 08/31/2019 08:04   Mr Shoulder Left W Wo Contrast  Result Date: 09/01/2019 CLINICAL DATA:  Left shoulder pain, bacteremia EXAM: MRI OF THE LEFT SHOULDER WITHOUT AND WITH CONTRAST TECHNIQUE: Multiplanar, multisequence MR imaging of the left shoulder was performed before and after the administration of intravenous contrast. CONTRAST:  8 mL Gadavist IV contrast COMPARISON:  None. FINDINGS: Rotator cuff: Mild bursal surface irregularity of the distal supraspinatus tendon. There is tendinosis  supraspinatus, infraspinatus, and subscapularis tendons. No full-thickness tear. Teres minor intact. Muscles: No atrophy or abnormal signal of the muscles of the rotator cuff. Biceps long head: Biceps tendinosis with probable interstitial tear of the intra-articular portion (series 5, images 11-12). Acromioclavicular Joint: Mild-moderate arthropathy of the acromioclavicular joint. No subacromial/subdeltoid bursal fluid. Glenohumeral Joint: No glenohumeral joint effusion. No pericapsular edema or enhancement. Diffuse cartilage thinning and surface irregularity. Labrum: Labral degeneration with probable tear of the anterior-inferior labrum (series 2, images 16-17). Bones: No fracture or dislocation. Reactive degenerative marrow changes centered at the [facility] joint. No osseous destruction. Other: None. IMPRESSION: 1. No MRI evidence of septic arthritis of the left shoulder, as clinically questioned. 2. Rotator cuff tendinosis with low-grade bursal surface irregularity of the distal supraspinatus tendon. No full-thickness rotator cuff tear. 3. Biceps tendinosis with probable interstitial tear of the intra-articular portion. 4. 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 Electronically Signed   By: Davina Poke M.D.   On: 09/01/2019 14:28   Dg Chest Port 1 View  Result Date: 09/01/2019 CLINICAL DATA:  Follow-up sternoclavicular joint debridement EXAM: PORTABLE CHEST 1 VIEW COMPARISON:  08/31/2019 FINDINGS: Cardiomegaly. Unchanged elevation of the left hemidiaphragm with associated atelectasis or scarring. The visualized skeletal structures are unremarkable. IMPRESSION: Cardiomegaly. Unchanged elevation of the left hemidiaphragm with associated atelectasis or scarring. No acute appearing airspace opacity. Electronically Signed   By: Eddie Candle M.D.   On: 09/01/2019 09:22   Dg Chest Port 1 View  Result Date: 08/31/2019 CLINICAL DATA:  Shortness of breath. S/p LEFT  sternoclavicular joint debridement. EXAM: PORTABLE CHEST 1 VIEW COMPARISON:  08/19/2019 and prior radiographs FINDINGS: The cardiomediastinal silhouette is unremarkable. LEFT basilar atelectasis noted. There is no evidence of focal airspace disease, pulmonary edema, suspicious pulmonary nodule/mass, pleural effusion, or pneumothorax. No acute bony abnormalities are identified. IMPRESSION: LEFT basilar atelectasis. Electronically Signed   By: Margarette Canada M.D.   On: 08/31/2019 19:08   Ct Angio Chest/abd/pel For Dissection W And/or Wo Contrast  Result Date: 08/20/2019 CLINICAL DATA:  Chest pain extending to the back for 1 week. Arrhythmia. Known right iliac artery aneurysm. Known ascending thoracic aortic aneurysm. Initial encounter EXAM: CT ANGIOGRAPHY CHEST, ABDOMEN AND PELVIS TECHNIQUE: Multidetector CT imaging through the chest, abdomen and pelvis was performed using the standard protocol during bolus administration of intravenous contrast. Multiplanar reconstructed images and MIPs were obtained and reviewed to evaluate the vascular anatomy. CONTRAST:  166mL OMNIPAQUE IOHEXOL 350 MG/ML SOLN COMPARISON:  CT chest 02/03/2019 CT of the abdomen pelvis 07/30/2018 FINDINGS: CTA CHEST FINDINGS Cardiovascular: The heart size is normal. Dense coronary artery calcifications are again noted. Atherosclerotic calcifications are present at the aortic arch. No displaced calcifications are present The main pulmonary artery outflow tract remains dilated at 4.6 cm. The ascending aorta measures up to 4.1 cm. Atherosclerotic calcifications are present at  the origin of the left subclavian artery without significant stenosis. Distal arch calcifications are present. There is no aneurysm or evidence for dissection within the descending aorta. Mediastinum/Nodes: Subcentimeter mediastinal lymph nodes are similar the prior exam, likely reactive. Largest node is a stable 8 mm right paratracheal node. No new nodes are present. No  significant axillary mediastinal adenopathy is present. Lungs/Pleura: Mild-to-moderate centrilobular and paraseptal emphysema is again noted. There is mild diffuse bronchial wall thickening. Subpleural nodule in the right middle lobe is stable, measuring 8 x 5 mm. No new nodules are present. Patchy ground-glass opacities are again seen. There is no distinct honeycombing. No focal airspace consolidation is present. No new nodule or mass lesion is present. Musculoskeletal: 12 rib-bearing thoracic type vertebral bodies are present. Degenerative changes are noted in the lower cervical spine. No acute or healing fractures are present. Ribs are unremarkable. No focal lytic or blastic lesions are present. Review of the MIP images confirms the above findings. CTA ABDOMEN AND PELVIS FINDINGS VASCULAR Aorta: Normal caliber aorta without aneurysm, dissection, vasculitis or significant stenosis. Atherosclerotic calcifications are present particularly in the infrarenal aorta. Celiac: Patent without evidence of aneurysm, dissection, vasculitis or significant stenosis. SMA: Minimal atherosclerotic calcifications are present at the origin. There is no significant stenosis. Branch vessels are within normal limits. Renals: Duplicated right renal artery is present. Atherosclerotic changes are noted at the ostia of both renal arteries without significant stenosis. There is no aneurysm or vessel irregularity. No FMD. IMA: Patent without evidence of aneurysm, dissection, vasculitis or significant stenosis. Inflow: Aneurysmal dilation of the right common iliac artery measures up to 2.1 cm, not significantly changed. No dissection. No significant vasculitis. More mild dilation of the left common iliac artery is noted. Dense calcifications are present bilaterally without significant stenosis. Veins: No obvious venous abnormality within the limitations of this arterial phase study. Review of the MIP images confirms the above findings.  NON-VASCULAR Hepatobiliary: Peripherally calcified 2 cm gallstone is again seen. No focal hepatic lesions are present. Gallbladder is otherwise unremarkable. Common bile duct is normal. Pancreas: Unremarkable. No pancreatic ductal dilatation or surrounding inflammatory changes. Spleen: Normal in size without focal abnormality. Adrenals/Urinary Tract: The adrenal glands are normal bilaterally. 5.4 cm right lower pole exophytic cyst demonstrate some interval increase in size. More anterior upper pole cyst measures 2 point 1 cm, also slightly increased. Nonobstructing punctate stone is present at the lower pole of the right kidney. Left kidney is unremarkable. Ureter is normal. The urinary bladder is within normal limits. Stomach/Bowel: The stomach and duodenum are within normal limits. Small bowel is unremarkable. Terminal ileum is within normal limits. The ascending and transverse colon are within normal limits. Diverticular changes are present in the descending and sigmoid colon without inflammatory changes to suggest diverticulitis. Lymphatic: No significant retroperitoneal adenopathy is present. Reproductive: Prostate is mildly enlarged. Other: No abdominal wall hernia or abnormality. No abdominopelvic ascites. Musculoskeletal: Degenerative changes of the lumbar spine are most pronounced at L4-5 L5-S1. Vertebral body heights are maintained. No acute or healing fractures are present. Pelvis is within normal limits. The hips are located and within normal limits. Review of the MIP images confirms the above findings. IMPRESSION: 1. No acute vascular abnormality to explain the patient's chest pain. No dissection. 2. Ascending thoracic aortic aneurysm measures 4.1 cm maximally on today's study. Recommend semi-annual imaging followup by CTA or MRA and referral to cardiothoracic surgery if not already obtained. This recommendation follows 2010 ACCF/AHA/AATS/ACR/ASA/SCA/SCAI/SIR/STS/SVM Guidelines for the Diagnosis and  Management  of Patients With Thoracic Aortic Disease. Circulation. 2010; [id number]. Aortic aneurysm NOS (ICD10-I71.9) 3. Stable dilation main pulmonary outflow tract consistent with pulmonary arterial hypertension. 4. 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 5. Coronary artery disease. 6. Atherosclerotic changes in the abdominal aorta as described without aneurysm or dissection. 7. Stable aneurysmal dilation of the common iliac arteries, right greater than left. 8. Atherosclerotic changes at the origins of the superior mesenteric artery and bilateral renal arteries without significant stenosis. 9. Slight enlargement of right-sided renal cysts. 10. Punctate nonobstructing stone at the lower pole of the right kidney. 11. Degenerative changes in the thoracic and lumbar spine without acute abnormality. Electronically Signed   By: San Morelle M.D.   On: 08/20/2019 04:44   Xr Lumbar Spine 2-3 Views  Result Date: 08/17/2019 Radiographs 2 views lumbar spine: No acute fracture.  No spondylolisthesis.  Loss of lordotic curvature.  Disc space narrowing at L4-5 and L5-S1.  [place] Ekg Site Rite  Result Date: 09/02/2019 If Site Rite image not attached, placement could not be confirmed due to current cardiac rhythm.   (Echo, Carotid, EGD, Colonoscopy, ERCP)    Subjective: He is complaining of some back pain.  Says it has improved from before.  Denies having any other complaints at this time.  Discharge Exam: Vitals:   09/15/19 0543 09/15/19 0831  BP: 115/60   Pulse: 73   Resp: 16   Temp: (!) 97.5 F (36.4 C)   SpO2: 95% 98%   Vitals:   09/14/19 1156 09/14/19 2137 09/15/19 0543 09/15/19 0831  BP: 111/69 110/78 115/60   Pulse: 76 79 73   Resp: 18 16 16    Temp: 98.3 F (36.8 C) 97.8 F (36.6 C) (!) 97.5 F (36.4 C)   TempSrc: Oral Oral Oral   SpO2: 92% 94% 95% 98%  Weight:      Height:        General: Pt is alert, awake, not in acute  distress Cardiovascular: RRR, S1/S2 +, no rubs, no gallops Respiratory: CTA bilaterally, no wheezing, no rhonchi Abdominal: Soft, NT, ND, bowel sounds + Extremities: no edema, no cyanosis    The results of significant diagnostics from this hospitalization (including imaging, microbiology, ancillary and laboratory) are listed below for reference.     Microbiology: Recent Results (from the past 240 hour(s))  Novel Coronavirus, NAA (hospital order; send-out to ref lab)     Status: None   Collection Time: 09/13/19  2:25 PM   Specimen: Nasopharyngeal Swab; Respiratory  Result Value Ref Range Status   SARS-CoV-2, NAA NOT DETECTED NOT DETECTED Final    Comment: (NOTE) This nucleic acid amplification test was developed and its performance characteristics determined by [employer]. Nucleic acid amplification tests include PCR and TMA. This test has not been FDA cleared or approved. This test has been authorized by FDA under an Emergency Use Authorization (EUA). This test is only authorized for the duration of time the declaration that circumstances exist justifying the authorization of the emergency use of in vitro diagnostic tests for detection of SARS-CoV-2 virus and/or diagnosis of COVID-19 infection under section 564(b)(1) of the Act, 21 U.S.C. [id number]) (1), unless the authorization is terminated or revoked sooner. When diagnostic testing is negative, the possibility of a false negative result should be considered in the context of a patient's recent exposures and the presence of clinical signs and symptoms consistent with COVID-19. An individual without symptoms of COVID- 19 and who is not shedding SARS-CoV-2 vi  rus would expect to have a negative (not detected) result in this assay. Performed At: [facility] [address] [place], [place] [id number] Rush Farmer MD [id number]    [place]  Final    Comment: Performed at [facility], [place] [address], [place], [place] [address]     Labs: BNP (last 3 results) No results for input(s): BNP in the last 8760 hours. Basic Metabolic Panel: Recent Labs  Lab 09/11/19 0400 09/12/19 0411 09/13/19 0413 09/14/19 0344 09/15/19 0438  NA 135 136 134* 135 136  K 4.4 4.4 4.3 4.2 4.6  CL 100 100 100 101 103  CO2 25 25 25 24 26   GLUCOSE 136* 137* 153* 146* 137*  BUN 15 14 17 19 15   CREATININE 0.89 0.96 0.98 0.97 0.92  CALCIUM 9.5 9.6 9.2 9.4 9.2  MG 1.8 1.9 1.7 1.8 1.8   Liver Function Tests: Recent Labs  Lab 09/15/19 0438  AST 21  ALT 32  ALKPHOS 100  BILITOT 1.5*  PROT 6.3*  ALBUMIN 2.4*   No results for input(s): LIPASE, AMYLASE in the last 168 hours. No results for input(s): AMMONIA in the last 168 hours. CBC: Recent Labs  Lab 09/09/19 0332 09/13/19 0413 09/15/19 0438  WBC 10.9* 9.6 8.8  NEUTROABS 8.2* 6.6 5.3  HGB 14.9 14.8 14.4  HCT 44.7 44.3 41.1  MCV 91.4 91.9 91.9  PLT 187 188 176   Cardiac Enzymes: No results for input(s): CKTOTAL, CKMB, CKMBINDEX, TROPONINI in the last 168 hours. BNP: Invalid input(s): POCBNP CBG: Recent Labs  Lab 09/14/19 0800 09/14/19 1155 09/14/19 1646 09/14/19 2139 09/15/19 0857  GLUCAP 157* 170* 129* 147* 152*   D-Dimer No results for input(s): DDIMER in the last 72 hours. Hgb A1c No results for input(s): HGBA1C in the last 72 hours. Lipid Profile No results for input(s): CHOL, HDL, LDLCALC, TRIG, CHOLHDL, LDLDIRECT in the last 72 hours. Thyroid function studies No results for input(s): TSH, T4TOTAL, T3FREE, THYROIDAB in the last 72 hours.  Invalid input(s): FREET3 Anemia work up No results for input(s): VITAMINB12, FOLATE, FERRITIN, TIBC, IRON, RETICCTPCT in the last 72 hours. Urinalysis    Component Value Date/Time   COLORURINE YELLOW 07/30/2018 0632   APPEARANCEUR CLOUDY (A) 07/30/2018 0632   LABSPEC 1.016 07/30/2018 0632   PHURINE 7.0 07/30/2018 0632   GLUCOSEU NEGATIVE 07/30/2018 0632    HGBUR MODERATE (A) 07/30/2018 0632   HGBUR negative 04/18/2010 0815   BILIRUBINUR NEGATIVE 07/30/2018 0632   BILIRUBINUR 1 02/27/2017 1021   KETONESUR NEGATIVE 07/30/2018 0632   PROTEINUR NEGATIVE 07/30/2018 0632   UROBILINOGEN 0.2 02/27/2017 1021   UROBILINOGEN 1.0 06/04/2014 1038   NITRITE NEGATIVE 07/30/2018 0632   LEUKOCYTESUR NEGATIVE 07/30/2018 [id number]   Sepsis Labs Invalid input(s): PROCALCITONIN,  WBC,  LACTICIDVEN Microbiology Recent Results (from the past 240 hour(s))  Novel Coronavirus, NAA (hospital order; send-out to ref lab)     Status: None   Collection Time: 09/13/19  2:25 PM   Specimen: Nasopharyngeal Swab; Respiratory  Result Value Ref Range Status   SARS-CoV-2, NAA NOT DETECTED NOT DETECTED Final    Comment: (NOTE) This nucleic acid amplification test was developed and its performance characteristics determined by [employer]. Nucleic acid amplification tests include PCR and TMA. This test has not been FDA cleared or approved. This test has been authorized by FDA under an Emergency Use Authorization (EUA). This test is only authorized for the duration of time the declaration that circumstances exist justifying the  authorization of the emergency use of in vitro diagnostic tests for detection of SARS-CoV-2 virus and/or diagnosis of COVID-19 infection under section 564(b)(1) of the Act, 21 U.S.C. [id number]) (1), unless the authorization is terminated or revoked sooner. When diagnostic testing is negative, the possibility of a false negative result should be considered in the context of a patient's recent exposures and the presence of clinical signs and symptoms consistent with COVID-19. An individual without symptoms of COVID- 19 and who is not shedding SARS-CoV-2 vi rus would expect to have a negative (not detected) result in this assay. Performed At: [facility] [address] [place], [place] [id number] Rush Farmer MD [id number]     [place]  Final    Comment: Performed at [facility], [place] [address], [place], [place] [address]     Time coordinating discharge: Over 30 minutes  SIGNED:   Beverely Pace, MD  [facility] 09/15/2019, 9:49 AM Pager   If 7PM-7AM, please contact night-coverage [email or web address] Password [email or web address]

## 2019-09-15 NOTE — Progress Notes (Signed)
[name] [name], [name] Salk, MD  Physician  Physical Medicine and Rehabilitation  PMR Pre-admission  Signed  [name] of Service:  09/15/2019 11:43 AM      Related encounter: ED to Hosp-Admission (Discharged) from 08/27/2019 in [facility]      Signed         Show:Clear all _0 Manual_1 Template_2 Copied  Added by: _3 Charlett Blake, MD_4 Michel Santee, PT  _5 Hover for details PMR Admission Coordinator Pre-Admission Assessment  Patient: [name] [name]. is an 68 y.o., male MRN: [id number] DOB: Jul 15, 1951 Height: _6  (185.4 cm) Weight: 116.3 kg  Insurance Information HMO:     PPO: yes     PCP:      IPA:      80/20:      OTHER:  PRIMARY: U[employer] Medicare      Policy#: [id number]      Subscriber: patient CM Name: [name] [name]      Phone#: [phone]     Fax#:  Pre-Cert#: [id number]      Employer:  Benefits:  Phone #: [phone]     Name:  [name] [name]. [name]: 12/30/2018     Deduct: $200 (met)      Out of Pocket Max: $2200 (met $200)      Life Max: n/a CIR: 100% once deductible met      SNF: 100% once deductible met, limited to 100 days/cal year Outpatient: 100%     Co-Pay:  Home Health: 100%      Co-Pay:  DME: 100%     Co-Pay:  Providers:  SECONDARY:       Policy#:       Subscriber:  CM Name:       Phone#:     Fax#:  Pre-Cert#:       Employer:  Benefits:  Phone #:      Name:  [name] [name]:      Deduct:       Out of Pocket Max:       Life Max:  CIR:       SNF:  Outpatient:     Co-Pay:  Home Health:       Co-Pay:  DME:      Co-Pay:   Medicaid Application [name]:       Case Manager:  Disability Application [name]:       Case Worker:   The "Data Collection Information Summary" for patients in Inpatient Rehabilitation Facilities with attached "Privacy Act [place] Records" was provided and verbally reviewed with: Patient and Family  Emergency Contact Information         Contact Information    Name Relation Home Work [place] Spouse  [phone]  [phone]   [name] [name], [name] [name]   [phone]      Current Medical History  Patient Admitting Diagnosis: Discitis, debility History of Present Illness: Pt is a 68 y/o male with PMH of OSA with CPAP, obesity, CAD (stents), DM, optic nerve neuritis, and afib presenting with unrelenting back pain.  Early August he presented to [facility] for L optic nerve inflammation and received IV steroids and transitioned to PO for 2 weeks.  Subsequently he had RLE cellulitis, for which he received Doxycycline.  Once he finished this course of abx he began having back pain in the mid back and around his waist.  Prior to this he was walking 2 miles/day, but had to decrease to 1 mile/day, then none, due to pain.  Xray negative.  Pt declined injection in favor of PT.  He did not see PT because the pain escalated to the point he went to the ED at [facility] on 08/27/19.  CTA and xray showed small aneurysm.  Note weight loss of 20 pounds due to poor PO intake.  He has been unable to walk 2/2 pain.  On admission to [facility], he also had pain under his clavicles and in pec muscles that made it difficult for him to move his UEs.  Neurology was consulted and pt found to have MSS bacteremia with possible L4/5 discitis/osteomyelitis and started on Ancef.  He was also noted to have infected L sternoclavicular joint.  ID and CT surgery consulted.  ID recommended 14 days nafcillin (ends on 9/16) and transition to cefazolin (9/17 through 10/13).  Pt underwent I&D of infected L sternoclavicular joint with vac placement by CTS on 9/1.  Neurosurgery recommends conservative management of discitis and use of LSO.  Neurology signed off.  PT/OT evaluations were completed and pt recommended for CIR.     Patient's medical record from [facility] has been reviewed by the rehabilitation admission coordinator and physician.  Past Medical History      Past Medical History:  Diagnosis [name]  . Atrial fibrillation ([place]) 03/22/2009    a. s/p multiple DCCV; b. no coumadin due to low TE risk profile; c. Tikosyn Rx  . Coronary atherosclerosis of native coronary artery 11/2002   a. s/p stent to LAD 12/03; OM2 occluded at cath 12/03; d. myoview 5/10: no ischemia;  e. echo 7/11: EF 55%, BAE, mild RVE, PASP 41-45; Myoview was in March 2013. There was no ischemia or infarction, EF 51%   . Cutaneous abscess of back excluding buttocks 07/04/2014   Appears to stem from possibly a cyst very large area 6 cm contact surgeon office   . Diabetes mellitus without complication ([place])   . Drusen body    see opth note  . ERECTILE DYSFUNCTION 03/22/2009  . GERD 03/22/2009  . HYPERGLYCEMIA 04/25/2010  . HYPERLIPIDEMIA 03/22/2009  . 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 LATERAL EPICONDYLITIS, LEFT 10/24/2009  . LIVER FUNCTION TESTS, ABNORMAL 04/25/2010  . Local reaction to immunization 05/05/2012   minor resolving  zostavax   . Myocardial infarction ([place]) mi2003  . Numbness in left leg    foot related to back disease and surgery  . Obesity, unspecified 04/24/2009  . Perforated appendicitis with necrosis s/p open appendectomy 06/07/14 06/04/2014  . Renal cyst    Characterized by MRI as simple  . Ruptured suppurative appendicitis    2015   . SLEEP APNEA, OBSTRUCTIVE 03/22/2009   compliant with CPAP  . THROMBOCYTOPENIA 08/16/2010  . TOBACCO USE, QUIT 10/24/2009  . 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 Umbilical hernia     Family History   family history includes Breast cancer in his mother; Cancer in his father; Lung cancer in his father; Ovarian cancer in his mother; Thyroid disease in his mother.  Prior Rehab/Hospitalizations Has the patient had prior rehab or hospitalizations prior to admission? Yes  Has the patient had major surgery during 100 days prior to admission? Yes             Current Medications  Current Facility-Administered Medications:  .  acetaminophen (TYLENOL) tablet 650 mg,  650 mg, Oral, Q6H PRN, 650 mg at 08/30/19 0947 **OR** acetaminophen (TYLENOL) suppository 650 mg, 650 mg, Rectal, Q6H PRN, Barrett, Erin R, PA-C .  albuterol (PROVENTIL) (2.5 MG/3ML) 0.083%  nebulizer solution 2.5 mg, 2.5 mg, Nebulization, Q4H PRN, Barrett, Erin R, PA-C .  ALPRAZolam Duanne Moron) tablet 0.5 mg, 0.5 mg, Oral, TID PRN, Starla Link, Kshitiz, MD, 0.5 mg at 09/12/19 1738 .  alum & mag hydroxide-simeth (MAALOX/MYLANTA) 200-200-20 MG/5ML suspension 30 mL, 30 mL, Oral, Q6H PRN, Alma Friendly, MD, 30 mL at 09/06/19 1243 .  atorvastatin (LIPITOR) tablet 80 mg, 80 mg, Oral, QHS, Barrett, Erin R, PA-C, 80 mg at 09/14/19 2234 .  bisacodyl (DULCOLAX) suppository 10 mg, 10 mg, Rectal, Daily PRN, Barrett, Erin R, PA-C, 10 mg at 09/01/19 1448 .  brimonidine (ALPHAGAN) 0.15 % ophthalmic solution 1 drop, 1 drop, Both Eyes, TID, Barrett, Erin R, PA-C, 1 drop at 09/15/19 0930 .  [START ON 09/16/2019] ceFAZolin (ANCEF) IVPB 2g/100 mL premix, 2 g, Intravenous, Q8H, Dixon, Stephanie N, NP .  cyclobenzaprine (FLEXERIL) tablet 10 mg, 10 mg, Oral, TID PRN, Starla Link, Kshitiz, MD, 10 mg at 09/15/19 0044 .  dabigatran (PRADAXA) capsule 150 mg, 150 mg, Oral, Q12H, Joselyn Glassman A, RPH, 150 mg at 09/15/19 0925 .  dofetilide (TIKOSYN) capsule 500 mcg, 500 mcg, Oral, BID, Barrett, Erin R, PA-C, 500 mcg at 09/15/19 0925 .  feeding supplement (PRO-STAT SUGAR FREE 64) liquid 30 mL, 30 mL, Oral, BID, Alekh, Kshitiz, MD, 30 mL at 09/15/19 0925 .  fluticasone furoate-vilanterol (BREO ELLIPTA) 200-25 MCG/INH 1 puff, 1 puff, Inhalation, Daily, Alma Friendly, MD, 1 puff at 09/15/19 0830 .  influenza vaccine adjuvanted (FLUAD) injection 0.5 mL, 0.5 mL, Intramuscular, Tomorrow-1000, Alekh, Kshitiz, MD .  insulin aspart (novoLOG) injection 0-15 Units, 0-15 Units, Subcutaneous, TID WC, Barrett, Erin R, PA-C, 3 Units at 09/15/19 0926 .  insulin aspart (novoLOG) injection 0-5 Units, 0-5 Units, Subcutaneous, QHS, Barrett, Erin R,  PA-C, 2 Units at 09/04/19 2153 .  loratadine (CLARITIN) tablet 10 mg, 10 mg, Oral, Daily, Barrett, Erin R, PA-C, 10 mg at 09/15/19 0925 .  magnesium oxide (MAG-OX) tablet 400 mg, 400 mg, Oral, Daily, Alekh, Kshitiz, MD, 400 mg at 09/15/19 0925 .  morphine 2 MG/ML injection 2 mg, 2 mg, Intravenous, Q4H PRN, Alma Friendly, MD, 2 mg at 09/12/19 1244 .  multivitamin with minerals tablet 1 tablet, 1 tablet, Oral, Daily, Alekh, Kshitiz, MD, 1 tablet at 09/15/19 0925 .  nafcillin 2 g in sodium chloride 0.9 % 100 mL IVPB, 2 g, Intravenous, Q4H, Dixon, Stephanie N, NP, Last Rate: 200 mL/hr at 09/15/19 0756, 2 g at 09/15/19 0756 .  ondansetron (ZOFRAN) tablet 4 mg, 4 mg, Oral, Q6H PRN **OR** ondansetron (ZOFRAN) injection 4 mg, 4 mg, Intravenous, Q6H PRN, Barrett, Erin R, PA-C .  oxyCODONE-acetaminophen (PERCOCET/ROXICET) 5-325 MG per tablet 1-2 tablet, 1-2 tablet, Oral, Q4H PRN, Barrett, Erin R, PA-C, 2 tablet at 09/15/19 0925 .  pantoprazole (PROTONIX) EC tablet 40 mg, 40 mg, Oral, Daily, Barrett, Erin R, PA-C, 40 mg at 09/15/19 0925 .  polyethylene glycol (MIRALAX / GLYCOLAX) packet 17 g, 17 g, Oral, BID, Barrett, Erin R, PA-C, 17 g at 09/14/19 0946 .  potassium chloride SA (K-DUR) CR tablet 40 mEq, 40 mEq, Oral, BID, Starla Link, Kshitiz, MD, 40 mEq at 09/15/19 0925 .  senna-docusate (Senokot-S) tablet 1 tablet, 1 tablet, Oral, BID, Barrett, Erin R, PA-C, 1 tablet at 09/15/19 0925 .  sodium chloride flush (NS) 0.9 % injection 10-40 mL, 10-40 mL, Intracatheter, Q12H, Alma Friendly, MD, 10 mL at 09/03/19 2208 .  sodium chloride flush (NS) 0.9 % injection 10-40 mL, 10-40 mL, Intracatheter, PRN,  Alma Friendly, MD, 10 mL at 09/08/19 0456 .  sodium phosphate (FLEET) 7-19 GM/118ML enema 1 enema, 1 enema, Rectal, Once PRN, Barrett, Erin R, PA-C .  traZODone (DESYREL) tablet 50 mg, 50 mg, Oral, QHS PRN, Alma Friendly, MD, 50 mg at 09/15/19 0044  Patients Current Diet:     Diet Order                   Diet heart healthy/carb modified Room service appropriate? Yes with Assist; Fluid consistency: Thin  Diet effective now               Precautions / Restrictions Precautions Precautions: Fall Precaution Comments: wound vac L [place] joint  Spinal Brace: Lumbar corset Restrictions Weight Bearing Restrictions: No Other Position/Activity Restrictions: wound vac   Has the patient had 2 or more falls or a fall with injury in the past year? No  Prior Activity Level Limited Community (1-2x/wk): independent, retired   Prior Functional Level Self Care: Did the patient need help bathing, dressing, using the toilet or eating? Independent  Indoor Mobility: Did the patient need assistance with walking from room to room (with or without device)? Independent  Stairs: Did the patient need assistance with internal or external stairs (with or without device)? Independent  Functional Cognition: Did the patient need help planning regular tasks such as shopping or remembering to take medications? Independent  Home Assistive Devices / Equipment Home Assistive Devices/Equipment: CBG Meter, CPAP  Prior Device Use: Indicate devices/aids used by the patient prior to current illness, exacerbation or injury? None of the above  Current Functional Level Cognition  Overall Cognitive Status: Within Functional Limits for tasks assessed Orientation Level: Oriented X4 General Comments: Pt. motivated with therapy; grateful for to be going to CIR; highly motivated    Extremity Assessment (includes Sensation/Coordination)  Upper Extremity Assessment: RUE deficits/detail, LUE deficits/detail RUE Deficits / Details: Slow deliberate shoulder movements (per pt due to pain and weakness), elbow to hand WNL RUE Coordination: decreased gross motor LUE Deficits / Details: Slow deliberate shoulder movements (per pt due to pain and weakness), elbow to hand WNL LUE Coordination: decreased gross  motor  Lower Extremity Assessment: Generalized weakness    ADLs  Overall ADL's : Needs assistance/impaired Eating/Feeding: Independent, Bed level Grooming: Standing, Oral care, Min guard Grooming Details (indicate cue type and reason): MIN guard for safety; heavy reliance on BUE at sink for support; able to stand for 7 minutes but fatigued Upper Body Bathing: Minimal assistance, Bed level Lower Body Bathing: Total assistance, Bed level Upper Body Dressing : Maximal assistance, Bed level Lower Body Dressing: Total assistance, Bed level Toilet Transfer: Minimal assistance, Moderate assistance, Stand-pivot, RW Toilet Transfer Details (indicate cue type and reason): simulated toilet transfer EOB>recliner; MOD A for inital sit>stand; MIN A for transfer; cues for safety and body mechanics Functional mobility during ADLs: Minimal assistance, Moderate assistance, Rolling walker General ADL Comments: pt able tolerate functional mobility in room this session as precursor to higher level ADLs; MIN A for functional mobility; MOD A for sit>stand x2 trials    Mobility  Overal bed mobility: Needs Assistance Bed Mobility: Rolling, Sidelying to Sit Rolling: Supervision Sidelying to sit: Mod assist Supine to sit: Mod assist Sit to supine: Mod assist Sit to sidelying: Min assist, HOB elevated General bed mobility comments: pt able to perform log roll without assist, MOD A to elevate trunk into sitting; increased time d/t pain    Transfers  Overall transfer level: Needs  assistance Equipment used: Rolling walker (2 wheeled) Transfers: Sit to/from Stand Sit to Stand: Mod assist General transfer comment: MOD A for inital stand and to maintain standing balnce; MIN A for stand pivot to recliner; cues throughout for hand placement; does well with rocking forward method    Ambulation / Gait / Stairs / Wheelchair Mobility  Ambulation/Gait Ambulation/Gait assistance: Min assist, +2  safety/equipment Gait Distance (Feet): 10 Feet Assistive device: Rolling walker (2 wheeled) Gait Pattern/deviations: Step-through pattern, Decreased stride length, Trunk flexed General Gait Details: heavy reliance on RW, but pt reports feeling good with gait today Gait velocity: decreased    Posture / Balance Dynamic Sitting Balance Sitting balance - Comments: able to static with heavy reliance on BUE support to unweight spine Balance Overall balance assessment: Needs assistance Sitting-balance support: Feet supported, Bilateral upper extremity supported Sitting balance-Leahy Scale: Fair Sitting balance - Comments: able to static with heavy reliance on BUE support to unweight spine Postural control: Right lateral lean Standing balance support: Bilateral upper extremity supported, During functional activity Standing balance-Leahy Scale: Poor Standing balance comment: heavy reliance on BUE during functional activity    Special needs/care consideration BiPAP/CPAP CPAP at night CPM no Continuous Drip IV Nafcillin Dialysis no        Days n/a Life Vest no Oxygen no Special Bed no Trach Size no Wound Vac (area) yes      Location L chest Skin ecchymosis to L UE, MASD to sacrum, petecchiae to BLEs                              Bowel mgmt: continent, last BM 9/13 Bladder mgmt: continent Diabetic mgmt: yes Behavioral consideration no Chemo/radiation no   Previous Home Environment (from acute therapy documentation) Living Arrangements: Spouse/significant other Available Help at Discharge: Family, Available PRN/intermittently Type of Home: House Home Layout: One level Home Access: Stairs to enter [employer] of Steps: 2-3 Home Care Services: No Additional Comments: He may have some equipment at home, was not using prior to admission  Discharge Living Setting Plans for Discharge Living Setting: Patient's home Type of Home at Discharge: House Discharge Home Layout: One  level Discharge Home Access: Stairs to enter Entrance Stairs-Rails: Right Entrance Stairs-Number of Steps: 3 Discharge Bathroom Shower/Tub: Tub/shower unit Discharge Bathroom Toilet: Handicapped height Discharge Bathroom Accessibility: Yes How Accessible: Accessible via walker Does the patient have any problems obtaining your medications?: No  Social/Family/Support Systems Patient Roles: Spouse Anticipated Caregiver: Sonia Side (spouse)  Anticipated Ambulance person Information: [phone] (c), [phone] (h) Catalina Antigua, son, [phone] (c) Ability/Limitations of Caregiver: disabled, can only provide supervision Caregiver Availability: 24/7 Discharge Plan Discussed with Primary Caregiver: Yes Is Caregiver In Agreement with Plan?: Yes Does Caregiver/Family have Issues with Lodging/Transportation while Pt is in Rehab?: No  Goals/Additional Needs Patient/Family Goal for Rehab: PT/OT supervision to mod I Expected length of stay: 16-20 days Dietary Needs: heart health/thin Equipment Needs: tbd Pt/Family Agrees to Admission and willing to participate: Yes Program Orientation Provided & Reviewed with Pt/Caregiver Including Roles  & Responsibilities: Yes  Decrease burden of Care through IP rehab admission: n/a  Possible need for SNF placement upon discharge: Potentially.  Plan for home at supervision level with wife.  If pt unable to reach true supervision, may need further rehab at SNF level.   Patient Condition: I have reviewed medical records from [facility], spoken with CSW, and patient and spouse. I met with patient at the bedside  and discussed via phone for inpatient rehabilitation assessment.  Patient will benefit from ongoing PT and OT, can actively participate in 3 hours of therapy a day 5 days of the week, and can make measurable gains during the admission.  Patient will also benefit from the coordinated team approach during an Inpatient Acute Rehabilitation admission.   The patient will receive intensive therapy as well as Rehabilitation physician, nursing, social worker, and care management interventions.  Due to safety, skin/wound care, disease management, medication administration, pain management and patient education the patient requires 24 hour a day rehabilitation nursing.  The patient is currently mod +2 with mobility and basic ADLs.  Discharge setting and therapy post discharge at home with home health is anticipated.  Patient has agreed to participate in the Acute Inpatient Rehabilitation Program and will admit today.  Preadmission Screen Completed By:  Michel Santee, PT, DPT 09/15/2019 11:43 AM ______________________________________________________________________   Discussed status with Dr. Letta Pate on 09/15/19  at 12:00 PM  and received approval for admission today.  Admission Coordinator:  Michel Santee, PT, DPT time 12:00 PM Sudie Grumbling 09/15/19    Assessment/Plan: Diagnosis:Septic arthritis 1. Does the need for close, 24 hr/day Medical supervision in concert with the patient's rehab needs make it unreasonable for this patient to be served in a less intensive setting? Yes 2. Co-Morbidities requiring supervision/potential complications: Optic neuritis, Morbid Obesity, MSSA bacteremia 3. Due to bladder management, bowel management, safety, skin/wound care, disease management, medication administration, pain management and patient education, does the patient require 24 hr/day rehab nursing? Yes 4. Does the patient require coordinated care of a physician, rehab nurse, PT (1-2 hrs/day, 5 days/week) and OT (1-2 hrs/day, 5 days/week) to address physical and functional deficits in the context of the above medical diagnosis(es)? Yes Addressing deficits in the following areas: balance, endurance, locomotion, strength, transferring, bowel/bladder control, bathing, dressing, feeding, grooming, toileting and psychosocial support 5. Can the patient actively  participate in an intensive therapy program of at least 3 hrs of therapy 5 days a week? Yes 6. The potential for patient to make measurable gains while on inpatient rehab is good 7. Anticipated functional outcomes upon discharge from inpatients are: modified independent and supervision PT, modified independent and supervision OT, n/a SLP 8. Estimated rehab length of stay to reach the above functional goals is: 14-17d 9. Anticipated D/C setting: Home 10. Anticipated post D/C treatments: [place] therapy 11. Overall Rehab/Functional Prognosis: good  MD Signature: Charlett Blake M.D. [facility] FAAPM&R (Sports Med, Neuromuscular Med) Diplomate Am Board of Electrodiagnostic Med         Revision History

## 2019-09-15 NOTE — Progress Notes (Signed)
Inpatient Rehabilitation  Patient information reviewed and entered into eRehab system by Iyla Balzarini M. Brittany Amirault, M.A., CCC/SLP, PPS Coordinator.  Information including medical coding, functional ability and quality indicators will be reviewed and updated through discharge.    

## 2019-09-15 NOTE — Progress Notes (Signed)
15 Days Post-Op Procedure(s) (LRB): LEFT STERNOCLAVICULAR WOUND DEBRIDEMENT WITH APPLICATION OF WOUND VAC (Left) Subjective: No new problems or concerns.  He is pleased with his continued progress with achieving PT/OT goals.   Objective: Vital signs in last 24 hours: Temp:  [97.5 F (36.4 C)-98.1 F (36.7 C)] 98.1 F (36.7 C) (09/16 1406) Pulse Rate:  [73-79] 78 (09/16 1406) Resp:  [16-18] 18 (09/16 1406) BP: (110-125)/(60-78) 125/67 (09/16 1406) SpO2:  [94 %-98 %] 94 % (09/16 1406)   Intake/Output from previous day: 09/15 0701 - 09/16 0700 In: 700 [IV Piggyback:700] Out: 1200 [Urine:1200] Intake/Output this shift: Total I/O In: 480 [P.O.:480] Out: 1000 [Urine:1000]  Physical Exam: General appearance:alert, cooperative Wound:The right chest wound vac is intact and functioning appropriately. The vac drainage is thin, serous fluid.  Wound care notes reviewed.  Lab Results: Recent Labs    09/13/19 0413 09/15/19 0438  WBC 9.6 8.8  HGB 14.8 14.4  HCT 44.3 41.1  PLT 188 176   BMET:  Recent Labs    09/14/19 0344 09/15/19 0438  NA 135 136  K 4.2 4.6  CL 101 103  CO2 24 26  GLUCOSE 146* 137*  BUN 19 15  CREATININE 0.97 0.92  CALCIUM 9.4 9.2    PT/INR: No results for input(s): LABPROT, INR in the last 72 hours. ABG    Component Value Date/Time   TCO2 26 03/01/2017 0258   CBG (last 3)  Recent Labs    09/14/19 2139 09/15/19 0857 09/15/19 1311  GLUCAP 147* 152* 142*    Assessment/Plan: S/P Procedure(s) (LRB): LEFT STERNOCLAVICULAR WOUND DEBRIDEMENT WITH APPLICATION OF WOUND VAC (Left)  -POD15incision and drainage of left  joint abscess. The wound is clean and contracting.  WillcontinueM-W-F dressing changes but expect we will be able to convert to saline moist to dry dressings soon.  Continue IV Cefazolin and nafcillin for MSSA cultured from the woundper ID recs.  -We will continue to follow.    LOS: 19 days    [name],  [place] [phone] 09/15/2019

## 2019-09-15 NOTE — Progress Notes (Signed)
Occupational Therapy Treatment Patient Details Name: [name] [name]. MRN: [id number] DOB: 03-11-1951 Today's Date: 09/15/2019    History of present illness [name] [name]. is a 68 y.o. male with medical history significant of OSA on CPAP; perforated appy (2015); obesity; CAD s/p stents; HLD; DM; optic nerve neuritis; and afib on Pradaxa presenting with back pain.   OT comments  Pt making great progress towards OT goals this session. Pt supervision- MOD A for bed mobility. Most assist needed to elevate trunk; pt able to carryover log roll technique well. Sit>stand x2. MOD A for initial stand; does well with rocking forward x3. MIN A for simulated toilet transfer>recliner. MIN guard for functional mobility within room as precursor to higher level ADLs, Pt likely to go to CIR for continued skilled therapy. Will continue to follow for acute OT needs.   Follow Up Recommendations  CIR;Supervision/Assistance - 24 hour    Equipment Recommendations  None recommended by OT    Recommendations for Other Services      Precautions / Restrictions Precautions Precautions: Fall Precaution Comments: wound vac L Garretson joint  Required Braces or Orthoses: Spinal Brace Spinal Brace: Lumbar corset Restrictions Weight Bearing Restrictions: No Other Position/Activity Restrictions: wound vac       Mobility Bed Mobility Overal bed mobility: Needs Assistance Bed Mobility: Rolling;Sidelying to Sit Rolling: Supervision Sidelying to sit: Mod assist       General bed mobility comments: pt able to perform log roll without assist, MOD A to elevate trunk into sitting; increased time d/t pain  Transfers Overall transfer level: Needs assistance Equipment used: Rolling walker (2 wheeled) Transfers: Sit to/from Stand Sit to Stand: Mod assist         General transfer comment: MOD A for inital stand and to maintain standing balnce; MIN A for stand pivot to recliner; cues throughout for hand placement;  does well with rocking forward method    Balance Overall balance assessment: Needs assistance Sitting-balance support: Feet supported;Bilateral upper extremity supported Sitting balance-Leahy Scale: Fair Sitting balance - Comments: able to static with heavy reliance on BUE support to unweight spine   Standing balance support: Bilateral upper extremity supported;During functional activity Standing balance-Leahy Scale: Poor Standing balance comment: heavy reliance on BUE during functional activity                           ADL either performed or assessed with clinical judgement   ADL                           Toilet Transfer: Minimal assistance;Moderate assistance;Stand-pivot;RW Toilet Transfer Details (indicate cue type and reason): simulated toilet transfer EOB>recliner; MOD A for inital sit>stand; MIN A for transfer; cues for safety and body mechanics         Functional mobility during ADLs: Minimal assistance;Moderate assistance;Rolling walker General ADL Comments: pt able tolerate functional mobility in room this session as precursor to higher level ADLs; MIN guard for functional mobility; MOD A for sit>stand x2 trials     Vision Baseline Vision/History: Wears glasses Wears Glasses: At all times Patient Visual Report: No change from baseline     Perception     Praxis      Cognition Arousal/Alertness: Awake/alert Behavior During Therapy: [facility] for tasks assessed/performed  General Comments: Pt. motivated with therapy; grateful for to be going to CIR; highly motivated        Exercises     Shoulder Instructions       General Comments requested therapist don lumbar corset; education on becoming independent with brace    Pertinent Vitals/ Pain       Pain Assessment: Faces Faces Pain Scale: Hurts a little bit Pain Location: butt Pain Descriptors / Indicators: Constant;Discomfort Pain  Intervention(s): Monitored during session;Repositioned  Home Living                                          Prior Functioning/Environment              Frequency  Min 2X/week        Progress Toward Goals  OT Goals(current goals can now be found in the care plan section)  Progress towards OT goals: Progressing toward goals  Acute Rehab OT Goals Time For Goal Achievement: 09/15/19 Potential to Achieve Goals: Good  Plan Discharge plan remains appropriate;Frequency remains appropriate    Co-evaluation                 AM-PAC OT "6 Clicks" Daily Activity     Outcome Measure   Help from another person eating meals?: None Help from another person taking care of personal grooming?: A Little Help from another person toileting, which includes using toliet, bedpan, or urinal?: A Little Help from another person bathing (including washing, rinsing, drying)?: A Little Help from another person to put on and taking off regular upper body clothing?: A Little Help from another person to put on and taking off regular lower body clothing?: A Lot 6 Click Score: 18    End of Session Equipment Utilized During Treatment: Gait belt;Rolling walker;Other (comment)(lumbar corset)  OT Visit Diagnosis: Other abnormalities of gait and mobility (R26.89);Muscle weakness (generalized) (M62.81);Pain   Activity Tolerance Patient tolerated treatment well   Patient Left in chair;with call bell/phone within reach   Nurse Communication Mobility status        Time: [id number] OT Time Calculation (min): 37 min  Charges: OT General Charges $OT Visit: 1 Visit OT Treatments $Therapeutic Activity: 23-37 mins [place], [place] [phone] [place] 09/15/2019, 10:43 AM

## 2019-09-15 NOTE — Progress Notes (Signed)
Patient ID: [name] [name]., male   DOB: 27-Oct-1951, 68 y.o.   MRN: [id number] Patient was admitted to 4[facility]03 via bed, escorted by nursing staff.  Patient verbalized understanding of rehab process including fall prevention policy, personal belongings policy, and visitation policy.  Patient appears to be in no immediate distress at this time. Wound vac intact to left chest.  MASD and blanchable redness noted to buttocks.  Brita Romp, RN

## 2019-09-15 NOTE — PMR Pre-admission (Signed)
PMR Admission Coordinator Pre-Admission Assessment  Patient: [name] [name]. is an 68 y.o., male MRN: [id number] DOB: Nov 25, 1951 Height: 6' 1" (185.4 cm) Weight: 116.3 kg  Insurance Information HMO:     PPO: yes     PCP:      IPA:      80/20:      OTHER:  PRIMARY: UHC Medicare      Policy#: [id number]      Subscriber: patient CM Name: Kathrene Alu      Phone#: [phone]     Fax#:  Pre-Cert#: [id number]      Employer:  Benefits:  Phone #: [phone]     Name:  [name] [name]. Date: 12/30/2018     Deduct: $200 (met)      Out of Pocket Max: $2200 (met $200)      Life Max: n/a CIR: 100% once deductible met      SNF: 100% once deductible met, limited to 100 days/cal year Outpatient: 100%     Co-Pay:  Home Health: 100%      Co-Pay:  DME: 100%     Co-Pay:  Providers:  SECONDARY:       Policy#:       Subscriber:  CM Name:       Phone#:     Fax#:  Pre-Cert#:       Employer:  Benefits:  Phone #:      Name:  Eff. Date:      Deduct:       Out of Pocket Max:       Life Max:  CIR:       SNF:  Outpatient:     Co-Pay:  Home Health:       Co-Pay:  DME:      Co-Pay:   Medicaid Application Date:       Case Manager:  Disability Application Date:       Case Worker:   The "Data Collection Information Summary" for patients in Inpatient Rehabilitation Facilities with attached "Privacy Act [place] Records" was provided and verbally reviewed with: Patient and Family  Emergency Contact Information Contact Information    Name Relation Home Work [place] Spouse [phone]  [phone]   [name] [name], [name] [name]   [phone]      Current Medical History  Patient Admitting Diagnosis: Discitis, debility History of Present Illness: Pt is a 68 y/o male with PMH of OSA with CPAP, obesity, CAD (stents), DM, optic nerve neuritis, and afib presenting with unrelenting back pain.  Early August he presented to [facility] for L optic nerve inflammation and received IV steroids and transitioned to PO for 2  weeks.  Subsequently he had RLE cellulitis, for which he received Doxycycline.  Once he finished this course of abx he began having back pain in the mid back and around his waist.  Prior to this he was walking 2 miles/day, but had to decrease to 1 mile/day, then none, due to pain.  Xray negative.  Pt declined injection in favor of PT.  He did not see PT because the pain escalated to the point he went to the ED at [facility] on 08/27/19.  CTA and xray showed small aneurysm.  Note weight loss of 20 pounds due to poor PO intake.  He has been unable to walk 2/2 pain.  On admission to [facility], he also had pain under his clavicles and in pec muscles that made it difficult for him to move his UEs.  Neurology was consulted and pt  found to have MSS bacteremia with possible L4/5 discitis/osteomyelitis and started on Ancef.  He was also noted to have infected L sternoclavicular joint.  ID and CT surgery consulted.  ID recommended 14 days nafcillin (ends on 9/16) and transition to cefazolin (9/17 through 10/13).  Pt underwent I&D of infected L sternoclavicular joint with vac placement by CTS on 9/1.  Neurosurgery recommends conservative management of discitis and use of LSO.  Neurology signed off.  PT/OT evaluations were completed and pt recommended for CIR.     Patient's medical record from [facility] has been reviewed by the rehabilitation admission coordinator and physician.  Past Medical History  Past Medical History:  Diagnosis Date  . Atrial fibrillation ([place]) 03/22/2009   a. s/p multiple DCCV; b. no coumadin due to low TE risk profile; c. Tikosyn Rx  . Coronary atherosclerosis of native coronary artery 11/2002   a. s/p stent to LAD 12/03; OM2 occluded at cath 12/03; d. myoview 5/10: no ischemia;  e. echo 7/11: EF 55%, BAE, mild RVE, PASP 41-45; Myoview was in March 2013. There was no ischemia or infarction, EF 51%   . Cutaneous abscess of back excluding buttocks 07/04/2014   Appears to stem from possibly a cyst  very large area 6 cm contact surgeon office   . Diabetes mellitus without complication ([place])   . Drusen body    see opth note  . ERECTILE DYSFUNCTION 03/22/2009  . GERD 03/22/2009  . HYPERGLYCEMIA 04/25/2010  . HYPERLIPIDEMIA 03/22/2009  . 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 LATERAL EPICONDYLITIS, LEFT 10/24/2009  . LIVER FUNCTION TESTS, ABNORMAL 04/25/2010  . Local reaction to immunization 05/05/2012   minor resolving  zostavax   . Myocardial infarction ([place]) mi2003  . Numbness in left leg    foot related to back disease and surgery  . Obesity, unspecified 04/24/2009  . Perforated appendicitis with necrosis s/p open appendectomy 06/07/14 06/04/2014  . Renal cyst    Characterized by MRI as simple  . Ruptured suppurative appendicitis    2015   . SLEEP APNEA, OBSTRUCTIVE 03/22/2009   compliant with CPAP  . THROMBOCYTOPENIA 08/16/2010  . TOBACCO USE, QUIT 10/24/2009  . 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 Umbilical hernia     Family History   family history includes Breast cancer in his mother; Cancer in his father; Lung cancer in his father; Ovarian cancer in his mother; Thyroid disease in his mother.  Prior Rehab/Hospitalizations Has the patient had prior rehab or hospitalizations prior to admission? Yes  Has the patient had major surgery during 100 days prior to admission? Yes   Current Medications  Current Facility-Administered Medications:  .  acetaminophen (TYLENOL) tablet 650 mg, 650 mg, Oral, Q6H PRN, 650 mg at 08/30/19 0947 **OR** acetaminophen (TYLENOL) suppository 650 mg, 650 mg, Rectal, Q6H PRN, Barrett, Erin R, PA-C .  albuterol (PROVENTIL) (2.5 MG/3ML) 0.083% nebulizer solution 2.5 mg, 2.5 mg, Nebulization, Q4H PRN, Barrett, Erin R, PA-C .  ALPRAZolam Duanne Moron) tablet 0.5 mg, 0.5 mg, Oral, TID PRN, Starla Link, Kshitiz, MD, 0.5 mg at 09/12/19 1738 .  alum & mag hydroxide-simeth (MAALOX/MYLANTA) 200-200-20 MG/5ML suspension 30 mL, 30 mL, Oral, Q6H  PRN, Alma Friendly, MD, 30 mL at 09/06/19 1243 .  atorvastatin (LIPITOR) tablet 80 mg, 80 mg, Oral, QHS, Barrett, Erin R, PA-C, 80 mg at 09/14/19 2234 .  bisacodyl (DULCOLAX) suppository 10 mg, 10 mg, Rectal, Daily PRN, Barrett, Erin R, PA-C,  10 mg at 09/01/19 1448 .  brimonidine (ALPHAGAN) 0.15 % ophthalmic solution 1 drop, 1 drop, Both Eyes, TID, Barrett, Erin R, PA-C, 1 drop at 09/15/19 0930 .  [START ON 09/16/2019] ceFAZolin (ANCEF) IVPB 2g/100 mL premix, 2 g, Intravenous, Q8H, Dixon, Stephanie N, NP .  cyclobenzaprine (FLEXERIL) tablet 10 mg, 10 mg, Oral, TID PRN, Starla Link, Kshitiz, MD, 10 mg at 09/15/19 0044 .  dabigatran (PRADAXA) capsule 150 mg, 150 mg, Oral, Q12H, Joselyn Glassman A, RPH, 150 mg at 09/15/19 0925 .  dofetilide (TIKOSYN) capsule 500 mcg, 500 mcg, Oral, BID, Barrett, Erin R, PA-C, 500 mcg at 09/15/19 0925 .  feeding supplement (PRO-STAT SUGAR FREE 64) liquid 30 mL, 30 mL, Oral, BID, Alekh, Kshitiz, MD, 30 mL at 09/15/19 0925 .  fluticasone furoate-vilanterol (BREO ELLIPTA) 200-25 MCG/INH 1 puff, 1 puff, Inhalation, Daily, Alma Friendly, MD, 1 puff at 09/15/19 0830 .  influenza vaccine adjuvanted (FLUAD) injection 0.5 mL, 0.5 mL, Intramuscular, Tomorrow-1000, Alekh, Kshitiz, MD .  insulin aspart (novoLOG) injection 0-15 Units, 0-15 Units, Subcutaneous, TID WC, Barrett, Erin R, PA-C, 3 Units at 09/15/19 0926 .  insulin aspart (novoLOG) injection 0-5 Units, 0-5 Units, Subcutaneous, QHS, Barrett, Erin R, PA-C, 2 Units at 09/04/19 2153 .  loratadine (CLARITIN) tablet 10 mg, 10 mg, Oral, Daily, Barrett, Erin R, PA-C, 10 mg at 09/15/19 0925 .  magnesium oxide (MAG-OX) tablet 400 mg, 400 mg, Oral, Daily, Alekh, Kshitiz, MD, 400 mg at 09/15/19 0925 .  morphine 2 MG/ML injection 2 mg, 2 mg, Intravenous, Q4H PRN, Alma Friendly, MD, 2 mg at 09/12/19 1244 .  multivitamin with minerals tablet 1 tablet, 1 tablet, Oral, Daily, Alekh, Kshitiz, MD, 1 tablet at 09/15/19 0925 .   nafcillin 2 g in sodium chloride 0.9 % 100 mL IVPB, 2 g, Intravenous, Q4H, Dixon, Stephanie N, NP, Last Rate: 200 mL/hr at 09/15/19 0756, 2 g at 09/15/19 0756 .  ondansetron (ZOFRAN) tablet 4 mg, 4 mg, Oral, Q6H PRN **OR** ondansetron (ZOFRAN) injection 4 mg, 4 mg, Intravenous, Q6H PRN, Barrett, Erin R, PA-C .  oxyCODONE-acetaminophen (PERCOCET/ROXICET) 5-325 MG per tablet 1-2 tablet, 1-2 tablet, Oral, Q4H PRN, Barrett, Erin R, PA-C, 2 tablet at 09/15/19 0925 .  pantoprazole (PROTONIX) EC tablet 40 mg, 40 mg, Oral, Daily, Barrett, Erin R, PA-C, 40 mg at 09/15/19 0925 .  polyethylene glycol (MIRALAX / GLYCOLAX) packet 17 g, 17 g, Oral, BID, Barrett, Erin R, PA-C, 17 g at 09/14/19 0946 .  potassium chloride SA (K-DUR) CR tablet 40 mEq, 40 mEq, Oral, BID, Starla Link, Kshitiz, MD, 40 mEq at 09/15/19 0925 .  senna-docusate (Senokot-S) tablet 1 tablet, 1 tablet, Oral, BID, Barrett, Erin R, PA-C, 1 tablet at 09/15/19 0925 .  sodium chloride flush (NS) 0.9 % injection 10-40 mL, 10-40 mL, Intracatheter, Q12H, Alma Friendly, MD, 10 mL at 09/03/19 2208 .  sodium chloride flush (NS) 0.9 % injection 10-40 mL, 10-40 mL, Intracatheter, PRN, Alma Friendly, MD, 10 mL at 09/08/19 0456 .  sodium phosphate (FLEET) 7-19 GM/118ML enema 1 enema, 1 enema, Rectal, Once PRN, Barrett, Erin R, PA-C .  traZODone (DESYREL) tablet 50 mg, 50 mg, Oral, QHS PRN, Alma Friendly, MD, 50 mg at 09/15/19 0044  Patients Current Diet:  Diet Order            Diet heart healthy/carb modified Room service appropriate? Yes with Assist; Fluid consistency: Thin  Diet effective now  Precautions / Restrictions Precautions Precautions: Fall Precaution Comments: wound vac L [place] joint  Spinal Brace: Lumbar corset Restrictions Weight Bearing Restrictions: No Other Position/Activity Restrictions: wound vac   Has the patient had 2 or more falls or a fall with injury in the past year? No  Prior Activity  Level Limited Community (1-2x/wk): independent, retired   Prior Functional Level Self Care: Did the patient need help bathing, dressing, using the toilet or eating? Independent  Indoor Mobility: Did the patient need assistance with walking from room to room (with or without device)? Independent  Stairs: Did the patient need assistance with internal or external stairs (with or without device)? Independent  Functional Cognition: Did the patient need help planning regular tasks such as shopping or remembering to take medications? Independent  Home Assistive Devices / Equipment Home Assistive Devices/Equipment: CBG Meter, CPAP  Prior Device Use: Indicate devices/aids used by the patient prior to current illness, exacerbation or injury? None of the above  Current Functional Level Cognition  Overall Cognitive Status: Within Functional Limits for tasks assessed Orientation Level: Oriented X4 General Comments: Pt. motivated with therapy; grateful for to be going to CIR; highly motivated    Extremity Assessment (includes Sensation/Coordination)  Upper Extremity Assessment: RUE deficits/detail, LUE deficits/detail RUE Deficits / Details: Slow deliberate shoulder movements (per pt due to pain and weakness), elbow to hand WNL RUE Coordination: decreased gross motor LUE Deficits / Details: Slow deliberate shoulder movements (per pt due to pain and weakness), elbow to hand WNL LUE Coordination: decreased gross motor  Lower Extremity Assessment: Generalized weakness    ADLs  Overall ADL's : Needs assistance/impaired Eating/Feeding: Independent, Bed level Grooming: Standing, Oral care, Min guard Grooming Details (indicate cue type and reason): MIN guard for safety; heavy reliance on BUE at sink for support; able to stand for 7 minutes but fatigued Upper Body Bathing: Minimal assistance, Bed level Lower Body Bathing: Total assistance, Bed level Upper Body Dressing : Maximal assistance, Bed  level Lower Body Dressing: Total assistance, Bed level Toilet Transfer: Minimal assistance, Moderate assistance, Stand-pivot, RW Toilet Transfer Details (indicate cue type and reason): simulated toilet transfer EOB>recliner; MOD A for inital sit>stand; MIN A for transfer; cues for safety and body mechanics Functional mobility during ADLs: Minimal assistance, Moderate assistance, Rolling walker General ADL Comments: pt able tolerate functional mobility in room this session as precursor to higher level ADLs; MIN A for functional mobility; MOD A for sit>stand x2 trials    Mobility  Overal bed mobility: Needs Assistance Bed Mobility: Rolling, Sidelying to Sit Rolling: Supervision Sidelying to sit: Mod assist Supine to sit: Mod assist Sit to supine: Mod assist Sit to sidelying: Min assist, HOB elevated General bed mobility comments: pt able to perform log roll without assist, MOD A to elevate trunk into sitting; increased time d/t pain    Transfers  Overall transfer level: Needs assistance Equipment used: Rolling walker (2 wheeled) Transfers: Sit to/from Stand Sit to Stand: Mod assist General transfer comment: MOD A for inital stand and to maintain standing balnce; MIN A for stand pivot to recliner; cues throughout for hand placement; does well with rocking forward method    Ambulation / Gait / Stairs / Wheelchair Mobility  Ambulation/Gait Ambulation/Gait assistance: Min assist, +2 safety/equipment Gait Distance (Feet): 10 Feet Assistive device: Rolling walker (2 wheeled) Gait Pattern/deviations: Step-through pattern, Decreased stride length, Trunk flexed General Gait Details: heavy reliance on RW, but pt reports feeling good with gait today Gait velocity: decreased  Posture / Balance Dynamic Sitting Balance Sitting balance - Comments: able to static with heavy reliance on BUE support to unweight spine Balance Overall balance assessment: Needs assistance Sitting-balance support:  Feet supported, Bilateral upper extremity supported Sitting balance-Leahy Scale: Fair Sitting balance - Comments: able to static with heavy reliance on BUE support to unweight spine Postural control: Right lateral lean Standing balance support: Bilateral upper extremity supported, During functional activity Standing balance-Leahy Scale: Poor Standing balance comment: heavy reliance on BUE during functional activity    Special needs/care consideration BiPAP/CPAP CPAP at night CPM no Continuous Drip IV Nafcillin Dialysis no        Days n/a Life Vest no Oxygen no Special Bed no Trach Size no Wound Vac (area) yes      Location L chest Skin ecchymosis to L UE, MASD to sacrum, petecchiae to BLEs                              Bowel mgmt: continent, last BM 9/13 Bladder mgmt: continent Diabetic mgmt: yes Behavioral consideration no Chemo/radiation no   Previous Home Environment (from acute therapy documentation) Living Arrangements: Spouse/significant other Available Help at Discharge: Family, Available PRN/intermittently Type of Home: House Home Layout: One level Home Access: Stairs to enter [employer] of Steps: 2-3 Home Care Services: No Additional Comments: He may have some equipment at home, was not using prior to admission  Discharge Living Setting Plans for Discharge Living Setting: Patient's home Type of Home at Discharge: House Discharge Home Layout: One level Discharge Home Access: Stairs to enter Entrance Stairs-Rails: Right Entrance Stairs-Number of Steps: 3 Discharge Bathroom Shower/Tub: Tub/shower unit Discharge Bathroom Toilet: Handicapped height Discharge Bathroom Accessibility: Yes How Accessible: Accessible via walker Does the patient have any problems obtaining your medications?: No  Social/Family/Support Systems Patient Roles: Spouse Anticipated Caregiver: [name] [name] (spouse)  Anticipated Ambulance person Information: [phone] (c),  [phone] (h) [name] [name], son, [phone] (c) Ability/Limitations of Caregiver: disabled, can only provide supervision Caregiver Availability: 24/7 Discharge Plan Discussed with Primary Caregiver: Yes Is Caregiver In Agreement with Plan?: Yes Does Caregiver/Family have Issues with Lodging/Transportation while Pt is in Rehab?: No  Goals/Additional Needs Patient/Family Goal for Rehab: PT/OT supervision to mod I Expected length of stay: 16-20 days Dietary Needs: heart health/thin Equipment Needs: tbd Pt/Family Agrees to Admission and willing to participate: Yes Program Orientation Provided & Reviewed with Pt/Caregiver Including Roles  & Responsibilities: Yes  Decrease burden of Care through IP rehab admission: n/a  Possible need for SNF placement upon discharge: Potentially.  Plan for home at supervision level with wife.  If pt unable to reach true supervision, may need further rehab at SNF level.   Patient Condition: I have reviewed medical records from [facility], spoken with CSW, and patient and spouse. I met with patient at the bedside and discussed via phone for inpatient rehabilitation assessment.  Patient will benefit from ongoing PT and OT, can actively participate in 3 hours of therapy a day 5 days of the week, and can make measurable gains during the admission.  Patient will also benefit from the coordinated team approach during an Inpatient Acute Rehabilitation admission.  The patient will receive intensive therapy as well as Rehabilitation physician, nursing, social worker, and care management interventions.  Due to safety, skin/wound care, disease management, medication administration, pain management and patient education the patient requires 24 hour a day rehabilitation nursing.  The patient is currently mod +2 with  mobility and basic ADLs.  Discharge setting and therapy post discharge at home with home health is anticipated.  Patient has agreed to participate in the Acute  Inpatient Rehabilitation Program and will admit today.  Preadmission Screen Completed By:  Michel Santee, PT, DPT 09/15/2019 11:43 AM ______________________________________________________________________   Discussed status with Dr. Letta Pate on 09/15/19  at 12:00 PM  and received approval for admission today.  Admission Coordinator:  Michel Santee, PT, DPT time 12:00 PM Sudie Grumbling 09/15/19    Assessment/Plan: Diagnosis:Septic arthritis 1. Does the need for close, 24 hr/day Medical supervision in concert with the patient's rehab needs make it unreasonable for this patient to be served in a less intensive setting? Yes 2. Co-Morbidities requiring supervision/potential complications: Optic neuritis, Morbid Obesity, MSSA bacteremia 3. Due to bladder management, bowel management, safety, skin/wound care, disease management, medication administration, pain management and patient education, does the patient require 24 hr/day rehab nursing? Yes 4. Does the patient require coordinated care of a physician, rehab nurse, PT (1-2 hrs/day, 5 days/week) and OT (1-2 hrs/day, 5 days/week) to address physical and functional deficits in the context of the above medical diagnosis(es)? Yes Addressing deficits in the following areas: balance, endurance, locomotion, strength, transferring, bowel/bladder control, bathing, dressing, feeding, grooming, toileting and psychosocial support 5. Can the patient actively participate in an intensive therapy program of at least 3 hrs of therapy 5 days a week? Yes 6. The potential for patient to make measurable gains while on inpatient rehab is good 7. Anticipated functional outcomes upon discharge from inpatients are: modified independent and supervision PT, modified independent and supervision OT, n/a SLP 8. Estimated rehab length of stay to reach the above functional goals is: 14-17d 9. Anticipated D/C setting: Home 10. Anticipated post D/C treatments: [place] therapy 11. Overall  Rehab/Functional Prognosis: good  MD Signature: Charlett Blake M.D. [facility] FAAPM&R (Sports Med, Neuromuscular Med) Diplomate Am Board of Electrodiagnostic Med

## 2019-09-15 NOTE — H&P (Signed)
Physical Medicine and Rehabilitation Admission H&P     CC: Functional deficits due to diskitis.      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 He was treated with IV steroids overnight and discharged to home on taper. He developed RLE cellulitis post discharge which resolved with doxycyline but he started developing progressive back pain with poor po intake, inability to walk as well as bilateral shoulder pain (bilateral clavicle to chest wall and up his neck with inability to move BUE.  He was admitted for work up on 08/27/19 and found to be dehydrated with with BUN 36, had leucocytosis with WBC 12.0 and elevated CRP-1.3.   Neurology consulted for input and recommended MRI brain as well as spine to rule out infection v/s NMO/MS v/s PMR v/s siff person syndrome. CTA head neck was negative for evidence of CSF vasculitis and showed 50% stenosis left SA origin and 30-50% stenosis R-VA V4 segment.  MRI brain showed punctate infarct in right thalamus and in splenium corpus callosum question micro embolic from posterior circulation. MRI cervical. Thoracic and lumbar spine showed moderate to sever lumbar foraminal stenosis with bone marrow signal changes in L4/5 with mild paravertebral inflammatory changes and cervical spondylosis with canal narrowing C4/5. He developed fevers and blood cultures done positive for MSSA.   MRI sternum ordered due to swelling and showed septic left [place] joint with early osteomyelitis involving distal clavicle and significant soft tissue stranding with edema and left pectoralis muscular edema.    Dr. Servando Snare consulted and patient taken to OR 09/01/19  for I & D left [place] joint with application of wound VAC. Dr. Leonel Ramsay felt that  diffuse weakness was improving and most likely felt to be due to pain from diskitis/osteomyelitis but recommended meningitic dosage for coverage of MSSA bacteremia due to possibility for spread to meningitis. ID (Dr.Comer)  following for input and recommended Nafcillin  X 14 days with change to cefazolin 9/17-->10/12/19.  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 Reactive leucocytosis and thrombocytopenia is resolving. Hypokalemia and hypomagnesemia has resolved with supplementation.    Review of Systems  Constitutional: Negative for chills and fever.  HENT: Negative for hearing loss.   Eyes: Negative for double vision and photophobia.  Respiratory: Negative for cough and shortness of breath.   Cardiovascular: Negative for chest pain, palpitations and leg swelling.  Gastrointestinal: Negative for constipation and heartburn.  Genitourinary: Negative for dysuria and urgency.  Musculoskeletal: Positive for back pain (chronic) and joint pain (left > right shoulder). Negative for myalgias.  Skin: Negative for itching and rash.  Neurological: Positive for focal weakness (BLE --also limited by pain). Negative for dizziness and headaches.  Psychiatric/Behavioral: Negative for memory loss. The patient does not have insomnia.             Past Medical History:  Diagnosis Date  . Atrial fibrillation ([place]) 03/22/2009    a. s/p multiple DCCV; b. no coumadin due to low TE risk profile; c. Tikosyn Rx  . Coronary atherosclerosis of native coronary artery 11/2002    a. s/p stent to LAD 12/03; OM2 occluded at cath  12/03; d. myoview 5/10: no ischemia;  e. echo 7/11: EF 55%, BAE, mild RVE, PASP 41-45; Myoview was in March 2013. There was no ischemia or infarction, EF 51%   . Cutaneous abscess of back excluding buttocks 07/04/2014    Appears to stem from possibly a cyst very large area 6  cm contact surgeon office   . Diabetes mellitus without complication ([place])    . Drusen body      see opth note  . ERECTILE DYSFUNCTION 03/22/2009  . GERD 03/22/2009  . HYPERGLYCEMIA 04/25/2010  . HYPERLIPIDEMIA 03/22/2009  . Iliac aneurysm ([facility])      2.6 to be evaluated incidental finding on CT  . LATERAL EPICONDYLITIS, LEFT 10/24/2009  . LIVER FUNCTION TESTS, ABNORMAL 04/25/2010  . Local reaction to immunization 05/05/2012    minor resolving  zostavax   . Myocardial infarction ([place]) mi2003  . Numbness in left leg      foot related to back disease and surgery  . Obesity, unspecified 04/24/2009  . Perforated appendicitis with necrosis s/p open appendectomy 06/07/14 06/04/2014  . Renal cyst      Characterized by MRI as simple  . Ruptured suppurative appendicitis      2015   . SLEEP APNEA, OBSTRUCTIVE 03/22/2009    compliant with CPAP  . THROMBOCYTOPENIA 08/16/2010  . TOBACCO USE, QUIT 10/24/2009  . 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 Umbilical hernia            Past Surgical History:  Procedure Laterality Date  . APPENDECTOMY N/A 06/06/2014    Procedure: APPENDECTOMY;  Surgeon: Odis Hollingshead, MD;  Location: [facility];  Service: General;  Laterality: N/A;  . COLON SURGERY      . COLONOSCOPY   11/15/2011    Procedure: COLONOSCOPY;  Surgeon: Juanita Craver, MD;  Location: [facility];  Service: Endoscopy;  Laterality: N/A;  . COLONOSCOPY WITH PROPOFOL N/A 01/03/2017    Procedure: COLONOSCOPY WITH PROPOFOL;  Surgeon: Carol Ada, MD;  Location: [facility];  Service: Endoscopy;  Laterality: N/A;  . cyst on epiglottis   08/2002  . ESOPHAGOGASTRODUODENOSCOPY   11/15/2011    Procedure: ESOPHAGOGASTRODUODENOSCOPY (EGD);  Surgeon: Juanita Craver, MD;  Location: [facility];  Service: Endoscopy;  Laterality: N/A;  . LAPAROSCOPIC APPENDECTOMY N/A 06/06/2014    Procedure: APPENDECTOMY LAPAROSCOPIC attemted;  Surgeon: Odis Hollingshead, MD;  Location: [facility];  Service: General;  Laterality: N/A;  . LUMBAR  DISC SURGERY        two  holes in spinalcovering  . stent        LAD DUS 2004  . STERNAL WOUND DEBRIDEMENT Left 08/31/2019    Procedure: LEFT STERNOCLAVICULAR WOUND DEBRIDEMENT WITH APPLICATION OF WOUND VAC;  Surgeon: Grace Isaac, MD;  Location: [facility];  Service: Thoracic;  Laterality: Left;  . surgery l4-l5    1998    ruptured x 3  . ulnar neuropathy      . UMBILICAL HERNIA REPAIR        mesh          Family History  Problem Relation Age of Onset  . Thyroid disease Mother    . Ovarian cancer Mother    . Breast cancer Mother    . Lung cancer Father    . Cancer Father       Social History:  Married.Retired Management consultant and active. He manages home as wife disabled due to FM--has hired help for cleaning.  He reports that he quit smoking about 10 years  ago. His smoking use included cigarettes. He has a 84.00 pack-year smoking history. He has never used smokeless tobacco. He reports that he does not drink alcohol or use drugs.      Allergies  Allergen Reactions  . Novocain [Procaine] Hives      Dentist appointment in 1968; since then has tolerated lidocaine and provocaine with no hives or difficulty.  . Pseudoephedrine Other (See Comments)      Patient went into afib  . Quinolones        Patient was warned about not using Cipro and similar antibiotics. Recent studies have raised concern that fluoroquinolone antibiotics could be associated with an increased risk of aortic aneurysm Fluoroquinolones have non-antimicrobial properties that might jeopardise the integrity of the extracellular matrix of the vascular wall In a  propensity score matched cohort study in [place], there was a 66% increased rate of aortic aneurysm or dissection associated with oral fluoroquinolone use, compared wit  . Sulfonamide Derivatives        Childhood reaction   . Cardizem [Diltiazem] Rash      Also 2019  . Pseudoephedrine Hcl Palpitations              Facility-Administered Medications  Prior to Admission  Medication Dose Route Frequency Provider Last Rate Last Dose  . ipratropium-albuterol (DUONEB) 0.5-2.5 (3) MG/3ML nebulizer solution 3 mL  3 mL Nebulization Q6H Panosh, Standley Brooking, MD   3 mL at 01/02/18 1545    Medications Prior to Admission  Medication Sig Dispense Refill  . acetaminophen (TYLENOL) 325 MG tablet Take 650 mg by mouth every 6 (six) hours as needed for mild pain.      [name] albuterol (PROVENTIL HFA;VENTOLIN HFA) 108 (90 Base) MCG/ACT inhaler Inhale 2 puffs into the lungs every 6 (six) hours as needed for wheezing or shortness of breath. 1 Inhaler 0  . ALPHAGAN P 0.1 % SOLN Place 1 drop into both eyes 3 (three) times daily.    3  . atorvastatin (LIPITOR) 80 MG tablet TAKE 1 TABLET BY MOUTH  DAILY (Patient taking differently: Take 80 mg by mouth at bedtime. ) 90 tablet 1  . cyclobenzaprine (FLEXERIL) 10 MG tablet Take 1 tablet (10 mg total) by mouth 3 (three) times daily as needed for muscle spasms. 40 tablet 0  . diclofenac sodium (VOLTAREN) 1 % GEL Apply 4 g topically 4 (four) times daily. 100 g 0  . diphenhydrAMINE (BENADRYL) 25 MG tablet Take 25 mg by mouth at bedtime as needed for sleep.      [name] dofetilide (TIKOSYN) 500 MCG capsule Take 1 capsule (500 mcg total) by mouth 2 (two) times daily. 180 capsule 2  . famotidine (PEPCID) 10 MG tablet Take 10 mg by mouth as needed for heartburn.       . fexofenadine (ALLEGRA) 180 MG tablet Take 180 mg by mouth daily.      . fish oil-omega-3 fatty acids 1000 MG capsule Take 1 g by mouth 2 (two) times daily.       . fluticasone (FLONASE) 50 MCG/ACT nasal spray Place 2 sprays into both nostrils daily as needed for allergies. 48 g prn  . fluticasone furoate-vilanterol (BREO ELLIPTA) 200-25 MCG/INH AEPB Inhale 1 puff into the lungs daily as needed (shortness of breath).       [name] HYDROcodone-acetaminophen (NORCO) 5-325 MG tablet Take 1 tablet by mouth every 4 (four) hours as needed for moderate pain. One to two tabs every 4-6 hours for  pain 20 tablet  0  . KLOR-CON M20 20 MEQ tablet TAKE 1 TABLET BY MOUTH EVERY DAY (Patient taking differently: Take 20 mEq by mouth daily. ) 90 tablet 3  . lidocaine (LIDODERM) 5 % Place 1 patch onto the skin daily. Remove & Discard patch within 12 hours or as directed by MD 30 patch 0  . Magnesium (V-R MAGNESIUM) 250 MG TABS Take 250 mg by mouth at bedtime. Take with 500mg  tablet to equal 750mg        . Magnesium Oxide 500 MG TABS Take 500 mg by mouth at bedtime. Take with 250mg  tablet to equal 750mg       . metFORMIN (GLUCOPHAGE-XR) 500 MG 24 hr tablet Take as directed (Patient taking differently: Take 500 mg by mouth 3 (three) times daily. ) 270 tablet 3  . MULTIPLE VITAMIN PO Take 1 tablet by mouth every evening.       . ondansetron (ZOFRAN ODT) 4 MG disintegrating tablet Take 1 tablet (4 mg total) by mouth every 8 (eight) hours as needed for nausea or vomiting. 24 tablet 0  . oxyCODONE-acetaminophen (PERCOCET/ROXICET) 5-325 MG tablet Take 1 tablet by mouth every 4 (four) hours as needed for severe pain. 6 tablet 0  . pantoprazole (PROTONIX) 40 MG tablet Take 40 mg by mouth daily.      . predniSONE (DELTASONE) 20 MG tablet Take 3,3,3,2,2,2,1,1,1, 1/.2 1./2 1/.2 pills po qd taper 24 tablet 0  . Suvorexant (BELSOMRA) 20 MG TABS Take 1 tablet by mouth at bedtime. 90 tablet 1  . tamsulosin (FLOMAX) 0.4 MG CAPS capsule TAKE 1 CAPSULE BY MOUTH EVERY DAY (Patient taking differently: Take 0.4 mg by mouth daily as needed (repeat kidney stone). ) 30 capsule 5  . triamcinolone cream (KENALOG) 0.1 % Apply 1 application topically 2 (two) times daily. As needed. (Patient taking differently: Apply 1 application topically 2 (two) times daily as needed (rash). ) 30 g 0  . nitroGLYCERIN (NITROSTAT) 0.4 MG SL tablet Place 1 tablet (0.4 mg total) under the tongue every 5 (five) minutes as needed for chest pain. 25 tablet 12  . ONETOUCH VERIO test strip USE TWICE A DAY 50 each 12     Drug Regimen Review  Drug regimen  was reviewed and remains appropriate with no significant issues identified   Home: Home Living Family/patient expects to be discharged to:: Private residence Living Arrangements: Spouse/significant other Available Help at Discharge: Family, Available PRN/intermittently Type of Home: House Home Access: Stairs to enter Technical brewer of Steps: 2-3 Home Layout: One level Additional Comments: He may have some equipment at home, was not using prior to admission   Functional History: Prior Function Level of Independence: Independent   Functional Status:  Mobility: Bed Mobility Overal bed mobility: Needs Assistance Bed Mobility: Rolling, Sidelying to Sit Rolling: Supervision Sidelying to sit: Max assist Supine to sit: Mod assist Sit to supine: Mod assist Sit to sidelying: Min assist, HOB elevated General bed mobility comments: pt able to perform log roll without assist, Max A to raise trunk 2* 8/10 LBP, pt remedicated with flexeril Transfers Overall transfer level: Needs assistance Equipment used: Rolling walker (2 wheeled) Transfers: Sit to/from Stand Sit to Stand: Min assist, +2 physical assistance, From elevated surface General transfer comment: attempted sit to stand x 3 however pt unable to tolerate increased LBP when he placed UEs on RW, he was using BUEs to unweight his spine while sitting on edge of bed Ambulation/Gait Ambulation/Gait assistance: Min assist, +2 safety/equipment Gait Distance (Feet): 10 Feet  Assistive device: Rolling walker (2 wheeled) Gait Pattern/deviations: Step-through pattern, Decreased stride length, Trunk flexed General Gait Details: heavy reliance on RW, but pt reports feeling good with gait today Gait velocity: decreased     ADL: ADL Overall ADL's : Needs assistance/impaired Eating/Feeding: Independent, Bed level Grooming: Standing, Oral care, Min guard Grooming Details (indicate cue type and reason): MIN guard for safety; heavy  reliance on BUE at sink for support; able to stand for 7 minutes but fatigued Upper Body Bathing: Minimal assistance, Bed level Lower Body Bathing: Total assistance, Bed level Upper Body Dressing : Maximal assistance, Bed level Lower Body Dressing: Total assistance, Bed level Toilet Transfer: +2 for safety/equipment, +2 for physical assistance, Minimal assistance, Ambulation Toilet Transfer Details (indicate cue type and reason): simulated toilet transfer; +2 MIN for safety chair follow Functional mobility during ADLs: Minimal assistance, Min guard, +2 for physical assistance, +2 for safety/equipment General ADL Comments: pt able to tolerate OOB ADLS standing at sink 7 mins for self- care routine; MIN A +2 for safety and mobility progression; pt motivated by lots of positivity   Cognition: Cognition Overall Cognitive Status: Within Functional Limits for tasks assessed Orientation Level: Oriented X4 Cognition Arousal/Alertness: Awake/alert Behavior During Therapy: WFL for tasks assessed/performed Overall Cognitive Status: Within Functional Limits for tasks assessed General Comments: Pt. motivated with therapy     Blood pressure 115/60, pulse 73, temperature (!) 97.5 F (36.4 C), temperature source Oral, resp. rate 16, height 6\' 1"  (1.854 m), weight 116.3 kg, SpO2 98 %. Physical Exam  Vitals reviewed. Constitutional: He is oriented to person, place, and time. He appears well-developed and well-nourished. No distress.  Anxious and emotional at times.   Musculoskeletal:     Comments: Tenderness left lumbar paraspinals.   Neurological: He is alert and oriented to person, place, and time.  Skin: He is not diaphoretic.   General: No acute distress Mood and affect are appropriate Heart: irreg irregr rate and rhythm no rubs murmurs or extra sounds Lungs: Clear to auscultation, breathing unlabored, no rales or wheezes Abdomen: Positive bowel sounds, soft nontender to palpation,  nondistended Extremities: No clubbing, cyanosis, or edema Skin: No evidence of breakdown, no evidence of rash Neurologic: Cranial nerves II through XII intact, motor strength is 3/5 in bilateral deltoid,4+ bicep, tricep, grip, 3- hip flexor, knee extensors, ankle dorsiflexor and plantar flexor Sensory exam normal sensation to light touch and proprioception in bilateral upper and lower extremities Cerebellar exam normal finger to nose to finger as well as heel to shin in bilateral upper and lower extremities Musculoskeletal:decreased ROM at shoulders, wound vac left sternocostal joint     Lab Results Last 48 Hours        Results for orders placed or performed during the hospital encounter of 08/27/19 (from the past 48 hour(s))  Glucose, capillary     Status: Abnormal    Collection Time: 09/13/19 12:30 PM  Result Value Ref Range    Glucose-Capillary 162 (H) 70 - 99 mg/dL  Novel Coronavirus, NAA (hospital order; send-out to ref lab)     Status: None    Collection Time: 09/13/19  2:25 PM    Specimen: Nasopharyngeal Swab; Respiratory  Result Value Ref Range    SARS-CoV-2, NAA NOT DETECTED NOT DETECTED      Comment: (NOTE) This nucleic acid amplification test was developed and its performance characteristics determined by [employer]. Nucleic acid amplification tests include PCR and TMA. This test has not been FDA cleared or approved. This test  has been authorized by FDA under an Emergency Use Authorization (EUA). This test is only authorized for the duration of time the declaration that circumstances exist justifying the authorization of the emergency use of in vitro diagnostic tests for detection of SARS-CoV-2 virus and/or diagnosis of COVID-19 infection under section 564(b)(1) of the Act, 21 U.S.C. [id number]) (1), unless the authorization is terminated or revoked sooner. When diagnostic testing is negative, the possibility of a false negative result should be considered in  the context of a patient's recent exposures and the presence of clinical signs and symptoms consistent with COVID-19. An individual without symptoms of COVID- 19 and who is not shedding SARS-CoV-2 vi rus would expect to have a negative (not detected) result in this assay. Performed At: [facility] [place], [place] [id number] Rush Farmer MD [id number]      Coronavirus Source NASOPHARYNGEAL        Comment: Performed at [facility], [place] [address], [place], [place] [address]  Glucose, capillary     Status: Abnormal    Collection Time: 09/13/19  5:17 PM  Result Value Ref Range    Glucose-Capillary 157 (H) 70 - 99 mg/dL  Glucose, capillary     Status: Abnormal    Collection Time: 09/13/19 10:09 PM  Result Value Ref Range    Glucose-Capillary 140 (H) 70 - 99 mg/dL  Magnesium     Status: None    Collection Time: 09/14/19  3:44 AM  Result Value Ref Range    Magnesium 1.8 1.7 - 2.4 mg/dL      Comment: Performed at [facility], [place] [address] [place], [place] [id number]  Basic metabolic panel     Status: Abnormal    Collection Time: 09/14/19  3:44 AM  Result Value Ref Range    Sodium 135 135 - 145 mmol/L    Potassium 4.2 3.5 - 5.1 mmol/L    Chloride 101 98 - 111 mmol/L    CO2 24 22 - 32 mmol/L    Glucose, Bld 146 (H) 70 - 99 mg/dL    BUN 19 8 - 23 mg/dL    Creatinine, Ser 0.97 0.61 - 1.24 mg/dL    Calcium 9.4 8.9 - 10.3 mg/dL    GFR calc non Af Amer >60 >60 mL/min    GFR calc Af Amer >60 >60 mL/min    Anion gap 10 5 - 15      Comment: Performed at [facility], [place] [address], [place], [place] [address]  Glucose, capillary     Status: Abnormal    Collection Time: 09/14/19  8:00 AM  Result Value Ref Range    Glucose-Capillary 157 (H) 70 - 99 mg/dL  Glucose, capillary     Status: Abnormal    Collection Time: 09/14/19 11:55 AM  Result Value Ref Range    Glucose-Capillary 170 (H) 70 - 99 mg/dL  Glucose, capillary     Status: Abnormal     Collection Time: 09/14/19  4:46 PM  Result Value Ref Range    Glucose-Capillary 129 (H) 70 - 99 mg/dL  Glucose, capillary     Status: Abnormal    Collection Time: 09/14/19  9:39 PM  Result Value Ref Range    Glucose-Capillary 147 (H) 70 - 99 mg/dL  Magnesium     Status: None    Collection Time: 09/15/19  4:38 AM  Result Value Ref Range    Magnesium 1.8 1.7 - 2.4 mg/dL  Comment: Performed at [facility], [place] [address] [place], [place] [address]  CBC with Differential/Platelet     Status: Abnormal    Collection Time: 09/15/19  4:38 AM  Result Value Ref Range    WBC 8.8 4.0 - 10.5 K/uL    RBC 4.47 4.22 - 5.81 MIL/uL    Hemoglobin 14.4 13.0 - 17.0 g/dL    HCT 41.1 39.0 - 52.0 %    MCV 91.9 80.0 - 100.0 fL    MCH 32.2 26.0 - 34.0 pg    MCHC 35.0 30.0 - 36.0 g/dL    RDW 14.7 11.5 - 15.5 %    Platelets 176 150 - 400 K/uL    nRBC 0.0 0.0 - 0.2 %    Neutrophils Relative % 60 %    Neutro Abs 5.3 1.7 - 7.7 K/uL    Lymphocytes Relative 21 %    Lymphs Abs 1.9 0.7 - 4.0 K/uL    Monocytes Relative 10 %    Monocytes Absolute 0.9 0.1 - 1.0 K/uL    Eosinophils Relative 9 %    Eosinophils Absolute 0.8 (H) 0.0 - 0.5 K/uL    Basophils Relative 0 %    Basophils Absolute 0.0 0.0 - 0.1 K/uL    Immature Granulocytes 0 %    Abs Immature Granulocytes 0.03 0.00 - 0.07 K/uL      Comment: Performed at [facility] [address] [place], [place] [address]  Comprehensive metabolic panel     Status: Abnormal    Collection Time: 09/15/19  4:38 AM  Result Value Ref Range    Sodium 136 135 - 145 mmol/L    Potassium 4.6 3.5 - 5.1 mmol/L    Chloride 103 98 - 111 mmol/L    CO2 26 22 - 32 mmol/L    Glucose, Bld 137 (H) 70 - 99 mg/dL    BUN 15 8 - 23 mg/dL    Creatinine, Ser 0.92 0.61 - 1.24 mg/dL    Calcium 9.2 8.9 - 10.3 mg/dL    Total Protein 6.3 (L) 6.5 - 8.1 g/dL    Albumin 2.4 (L) 3.5 - 5.0 g/dL    AST 21 15 - 41 U/L    ALT 32 0 - 44 U/L    Alkaline Phosphatase 100 38 -  126 U/L    Total Bilirubin 1.5 (H) 0.3 - 1.2 mg/dL    GFR calc non Af Amer >60 >60 mL/min    GFR calc Af Amer >60 >60 mL/min    Anion gap 7 5 - 15      Comment: Performed at [facility] [address], [place], [place] [address]  C-reactive protein     Status: None    Collection Time: 09/15/19  4:38 AM  Result Value Ref Range    CRP 0.9 <1.0 mg/dL      Comment: Performed at [facility] [address] [place], [place] [address]  Glucose, capillary     Status: Abnormal    Collection Time: 09/15/19  8:57 AM  Result Value Ref Range    Glucose-Capillary 152 (H) 70 - 99 mg/dL     Imaging Results (Last 48 hours)  No results found.           Medical Problem List and Plan: 1.  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  Antithrombotics: -DVT/anticoagulation:  Pharmaceutical: Pradexa             -antiplatelet therapy: ASA  d/c 8/29 due to thrombocytopenia? 3. Pain Management: Continue flexeril prn. Will add lidocaine patch to lumbar paraspinals. Continue oxycodone prn --may need to premedicate prior to therapy. Will monitor for now. Add tramadol prn for moderate pain.  4. Mood: Team to provide ego support and encouragement. LCSW to follow for evaluation and support             -antipsychotic agents: N/A 5. Neuropsych: This patient is capable of making decisions on his own behalf. 6. Skin/Wound Care: Routine pressure relief measures.  7. Fluids/Electrolytes/Nutrition: Monitor I/O. Continue prostat supplement due to low protein store and to promote healing.   8. MSSA bacteremia:  Thrombocytopenia/leucocytosis has resolved. CRP 1.3-->6.1-->0.9  Naficin every 4 thorough 9/16 due to concerns of meningitic spread then change to Cefazolin thorough 10/12/19 9. Diskitis/left sternoclavicular infection: Wound VAC dressing changes MWF per WOC.  10. Left biceps tendinosis/Mild-moderate OA left shoulder: Follow up with ortho post discharge.  11. NAION/optic neuritis: Stable and vision  stable OS 12. PAF: Monitor HR tid--continue Tikosyn and Pradexa. 13. T2DM: Hgb A1C- 6.7 and controlled on metformin BID PTA. Has had 30 lbs weight loss over past 3-4 months. Appetite improving and AKI resolved. Will monitor BS ac/hs with SSI for elevated BS.  14.  Emphysema: Continue Breo. Currently needing supplemental oxygen prn per patient.  15. Hypokalemia/Hypomagnesemia: K+ trending up. Will continue K dur and Mag Ox for now--recheck labs in am.        "I have personally performed a face to face diagnostic evaluation of this patient.  Additionally, I have reviewed and concur with the physician assistant's documentation above."  Charlett Blake M.D. [facility] FAAPM&R (Sports Med, Neuromuscular Med) Diplomate [place] Board of [place], PA-C 09/15/2019

## 2019-09-15 NOTE — Consult Note (Signed)
[place] Nurse woundfollow-upconsult note VAC dressing change toleft chest Wound type:full thickness post-op wound Wound bed:100% red, bone palpable, slowly decreasing in size Drainage (amount, consistency, odor)mod amtdark red-browndrainage, no odor 1.5X4X.3cm Periwound:intact Dressing procedure/placement/frequency:one piece of black foam removed, one piece placed into wound bed.Pthad pain meds prior to procedure and tolerated with mod amt discomfort.Immediate seal obtainedat 118mm cont suction. [place] team will plan to changeFri. Julien Girt MSN, RN, [place], [place], [place]

## 2019-09-15 NOTE — Progress Notes (Signed)
Report given to [facility] on 4MW

## 2019-09-16 ENCOUNTER — Inpatient Hospital Stay (HOSPITAL_COMMUNITY): Payer: Medicare Other | Admitting: Physical Therapy

## 2019-09-16 ENCOUNTER — Inpatient Hospital Stay (HOSPITAL_COMMUNITY): Payer: Medicare Other | Admitting: Occupational Therapy

## 2019-09-16 DIAGNOSIS — E876 Hypokalemia: Secondary | ICD-10-CM

## 2019-09-16 DIAGNOSIS — E669 Obesity, unspecified: Secondary | ICD-10-CM | POA: Insufficient documentation

## 2019-09-16 DIAGNOSIS — E46 Unspecified protein-calorie malnutrition: Secondary | ICD-10-CM

## 2019-09-16 DIAGNOSIS — I639 Cerebral infarction, unspecified: Secondary | ICD-10-CM

## 2019-09-16 DIAGNOSIS — E1169 Type 2 diabetes mellitus with other specified complication: Secondary | ICD-10-CM

## 2019-09-16 DIAGNOSIS — E119 Type 2 diabetes mellitus without complications: Secondary | ICD-10-CM | POA: Insufficient documentation

## 2019-09-16 DIAGNOSIS — E8809 Other disorders of plasma-protein metabolism, not elsewhere classified: Secondary | ICD-10-CM

## 2019-09-16 DIAGNOSIS — I6381 Other cerebral infarction due to occlusion or stenosis of small artery: Secondary | ICD-10-CM

## 2019-09-16 LAB — CBC WITH DIFFERENTIAL/PLATELET
Abs Immature Granulocytes: 0.03 10*3/uL (ref 0.00–0.07)
Basophils Absolute: 0 10*3/uL (ref 0.0–0.1)
Basophils Relative: 0 %
Eosinophils Absolute: 0.9 10*3/uL — ABNORMAL HIGH (ref 0.0–0.5)
Eosinophils Relative: 9 %
HCT: 42.4 % (ref 39.0–52.0)
Hemoglobin: 13.9 g/dL (ref 13.0–17.0)
Immature Granulocytes: 0 %
Lymphocytes Relative: 19 %
Lymphs Abs: 1.8 10*3/uL (ref 0.7–4.0)
MCH: 30.5 pg (ref 26.0–34.0)
MCHC: 32.8 g/dL (ref 30.0–36.0)
MCV: 93 fL (ref 80.0–100.0)
Monocytes Absolute: 0.8 10*3/uL (ref 0.1–1.0)
Monocytes Relative: 9 %
Neutro Abs: 5.7 10*3/uL (ref 1.7–7.7)
Neutrophils Relative %: 63 %
Platelets: 197 10*3/uL (ref 150–400)
RBC: 4.56 MIL/uL (ref 4.22–5.81)
RDW: 15 % (ref 11.5–15.5)
WBC: 9.2 10*3/uL (ref 4.0–10.5)
nRBC: 0 % (ref 0.0–0.2)

## 2019-09-16 LAB — COMPREHENSIVE METABOLIC PANEL
ALT: 30 U/L (ref 0–44)
AST: 24 U/L (ref 15–41)
Albumin: 2.5 g/dL — ABNORMAL LOW (ref 3.5–5.0)
Alkaline Phosphatase: 103 U/L (ref 38–126)
Anion gap: 8 (ref 5–15)
BUN: 19 mg/dL (ref 8–23)
CO2: 26 mmol/L (ref 22–32)
Calcium: 9.1 mg/dL (ref 8.9–10.3)
Chloride: 102 mmol/L (ref 98–111)
Creatinine, Ser: 1.06 mg/dL (ref 0.61–1.24)
GFR calc Af Amer: >60 mL/min (ref >60)
GFR calc non Af Amer: >60 mL/min (ref >60)
Glucose, Bld: 128 mg/dL — ABNORMAL HIGH (ref 70–99)
Potassium: 4.6 mmol/L (ref 3.5–5.1)
Sodium: 136 mmol/L (ref 135–145)
Total Bilirubin: 1.7 mg/dL — ABNORMAL HIGH (ref 0.3–1.2)
Total Protein: 6.4 g/dL — ABNORMAL LOW (ref 6.5–8.1)

## 2019-09-16 LAB — GLUCOSE, CAPILLARY
Glucose-Capillary: 123 mg/dL — ABNORMAL HIGH (ref 70–99)
Glucose-Capillary: 142 mg/dL — ABNORMAL HIGH (ref 70–99)
Glucose-Capillary: 133 mg/dL — ABNORMAL HIGH (ref 70–99)
Glucose-Capillary: 141 mg/dL — ABNORMAL HIGH (ref 70–99)

## 2019-09-16 LAB — MAGNESIUM: Magnesium: 1.8 mg/dL (ref 1.7–2.4)

## 2019-09-16 MED ORDER — MUSCLE RUB 10-15 % EX CREA
TOPICAL_CREAM | CUTANEOUS | Status: DC | PRN
  Administered 2019-09-17 – 2019-09-21 (×2): via TOPICAL
  Filled 2019-09-16: qty 85

## 2019-09-16 MED ORDER — CHLORHEXIDINE GLUCONATE CLOTH 2 % EX PADS
6.0000 | MEDICATED_PAD | Freq: Two times a day (BID) | CUTANEOUS | Status: DC
  Administered 2019-09-16 – 2019-10-02 (×31): 6 via TOPICAL

## 2019-09-16 NOTE — Plan of Care (Signed)
  Problem: Consults Goal: RH GENERAL PATIENT EDUCATION Description: See Patient Education module for education specifics. Outcome: Progressing Goal: Skin Care Protocol Initiated - if Braden Score 18 or less Description: If consults are not indicated, leave blank or document N/A Outcome: Progressing Goal: Nutrition Consult-if indicated Outcome: Progressing Goal: Diabetes Guidelines if Diabetic/Glucose > 140 Description: If diabetic or lab glucose is > 140 mg/dl - Initiate Diabetes/Hyperglycemia Guidelines & Document Interventions  Outcome: Progressing   Problem: RH SKIN INTEGRITY Goal: RH STG SKIN FREE OF INFECTION/BREAKDOWN Description: Patients skin will remain free from further infection or breakdown with min assist. Outcome: Progressing Goal: RH STG MAINTAIN SKIN INTEGRITY WITH ASSISTANCE Description: STG Maintain Skin Integrity With min Assistance. Outcome: Progressing Goal: RH STG ABLE TO PERFORM INCISION/WOUND CARE W/ASSISTANCE Description: STG Able To Perform Incision/Wound Care With total Assistance from caregiver. Outcome: Progressing   Problem: RH PAIN MANAGEMENT Goal: RH STG PAIN MANAGED AT OR BELOW PT'S PAIN GOAL Description: < 4 Outcome: Progressing

## 2019-09-16 NOTE — Progress Notes (Addendum)
[facility] Individual Statement of Services  Patient Name:  [name] [name].  Date:  09/16/2019  Welcome to the [facility].  Our goal is to provide you with an individualized program based on your diagnosis and situation, designed to meet your specific needs.  With this comprehensive rehabilitation program, you will be expected to participate in at least 3 hours of rehabilitation therapies Monday-Friday, with modified therapy programming on the weekends.  Your rehabilitation program will include the following services:  Physical Therapy (PT), Occupational Therapy (OT), 24 hour per day rehabilitation nursing, Neuropsychology, Case Management (Social Worker), Rehabilitation Medicine, Nutrition Services and Pharmacy Services  Weekly team conferences will be held on Wednesdays to discuss your progress.  Your Social Worker will talk with you frequently to get your input and to update you on team discussions.  Team conferences with you and your family in attendance may also be held.  Expected length of stay:  10 to 14 days  Overall anticipated outcome:  Modified Independent/supervision  Depending on your progress and recovery, your program may change. Your Social Worker will coordinate services and will keep you informed of any changes. Your Social Worker's name and contact numbers are listed  below.  The following services may also be recommended but are not provided by the [facility] will be made to provide these services after discharge if needed.  Arrangements include referral to agencies that provide these services.  Your insurance has been verified to be:  [employer] Your primary doctor is:  Dr. Shanon Ace  Pertinent information will be shared with your doctor and your insurance company.  Social  Worker:  Alfonse Alpers, LCSW  [phone] or [phone]  Information discussed with and copy given to patient by: Trey Sailors, 09/16/2019, 1:33 PM

## 2019-09-16 NOTE — Progress Notes (Addendum)
[facility] PHYSICAL MEDICINE & REHABILITATION PROGRESS NOTE  Subjective/Complaints: Patient seen sitting up in bed this morning is ready begin therapies.  ROS: Denies CP, SOB, N/V/D  Objective: Vital Signs: Blood pressure 105/77, pulse 71, temperature (!) 97.2 F (36.2 C), temperature source Oral, resp. rate 18, height 6\' 1"  (1.854 m), weight 117.8 kg, SpO2 99 %. No results found. Recent Labs    09/15/19 0438 09/16/19 0424  WBC 8.8 9.2  HGB 14.4 13.9  HCT 41.1 42.4  PLT 176 197   Recent Labs    09/15/19 0438 09/16/19 0424  NA 136 136  K 4.6 4.6  CL 103 102  CO2 26 26  GLUCOSE 137* 128*  BUN 15 19  CREATININE 0.92 1.06  CALCIUM 9.2 9.1    Physical Exam: BP 105/77 (BP Location: Left Arm)   Pulse 71   Temp (!) 97.2 F (36.2 C) (Oral)   Resp 18   Ht 6\' 1"  (1.854 m)   Wt 117.8 kg   SpO2 99%   BMI 34.26 kg/m  Constitutional: No distress . Vital signs reviewed. HENT: Normocephalic.  Atraumatic. Eyes: EOMI. No discharge. Cardiovascular: No JVD. Respiratory: Normal effort.  No stridor. GI: Non-distended. Skin: +VAC to chest Psych: Normal mood.  Normal behavior. Musc: No edema.  No tenderness. Neurologic: Alert Alert  Motor: B/l 4-4+/5 proximal to distal Left lower extremity: 4--4/5 proximal distal Right lower extremity: 4-5 proximal to distal   Assessment/Plan: 1. Functional deficits secondary to right thalamic infarct with lumbar discitis which require 3+ hours per day of interdisciplinary therapy in a comprehensive inpatient rehab setting.  Physiatrist is providing close team supervision and 24 hour management of active medical problems listed below.  Physiatrist and rehab team continue to assess barriers to discharge/monitor patient progress toward functional and medical goals  Care Tool:  Bathing              Bathing assist       Upper Body Dressing/Undressing Upper body dressing        Upper body assist      Lower Body  Dressing/Undressing Lower body dressing            Lower body assist       Toileting Toileting    Toileting assist Assist for toileting: Minimal Assistance - Patient > 75% Assistive Device Comment: urinal   Transfers Chair/bed transfer  Transfers assist           Locomotion Ambulation   Ambulation assist              Walk 10 feet activity   Assist           Walk 50 feet activity   Assist           Walk 150 feet activity   Assist           Walk 10 feet on uneven surface  activity   Assist           Wheelchair     Assist               Wheelchair 50 feet with 2 turns activity    Assist            Wheelchair 150 feet activity     Assist          Medical Problem List and Plan: 1.  Weakness secondary to right thalamic infarct with lumbar discitis and osteomyelitis  Cont CIR  MRI brain and L-spine personal  reviewed - diskitis appears to be on right side and punctate brain infarcts 2. Antithrombotics: -DVT/anticoagulation:Pharmaceutical:Pradexa -antiplatelet therapy: ASA d/c 8/29 due to thrombocytopenia? 3. Pain Management:Continue flexeril prn.  Added lidocaine patch to lumbar paraspinals. Continue oxycodone prn --may need to premedicate prior to therapy. Will monitor for now.  Added tramadol prn for moderate pain. 4. Mood:Team to provide ego support and encouragement.LCSW to follow for evaluation and support -antipsychotic agents: N/A 5. Neuropsych: This patientiscapable of making decisions on hisown behalf. 6. Skin/Wound Care:See #9 7. Fluids/Electrolytes/Nutrition:Monitor I/Os. Continue prostat supplement due to low protein store and to promote healing. 8. MSSA bacteremia: Naficin every 4 thorough 9/16due to concerns of meningitic spreadthen changed to Cefazolin thorough 10/12/19 9. Diskitis/left sternoclavicular infection: Wound VACdressing changesMWFper  WOC. 10. Left biceps tendinosis/Mild-moderate OA left shoulder: Follow up with ortho post discharge.  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 PAF: Monitor HR tid--continue Tikosyn and Pradexa. 13. T2DM: Hgb A1C- 6.7and controlled on metformin BID PTA. Will monitor BS ac/hs with SSI for elevated BS.  Monitor with increased mobility 14.Emphysema: Continue Breo. Currently needing supplemental oxygen prn per patient.  15. Hypokalemia/Hypomagnesemia: Continue K dur and Mag Ox for now  Potassium 4.6 on 9/17  Magnesium 1.8 on 9/17 16.  Hypoalbuminemia  Supplement initiated  LOS: 1 days A FACE TO FACE EVALUATION WAS PERFORMED  Leafy Motsinger Lorie Phenix 09/16/2019, 9:28 AM

## 2019-09-16 NOTE — Progress Notes (Signed)
Pt has home CPAP at bedside. Power cords/tubing inspected by this RT and appear to be in good working order. Water chamber filled, and machine moved within pt's reach. Pt to place on when he is ready for bed. Advised pt to notify for RT if any further assistance is needed.

## 2019-09-16 NOTE — Evaluation (Signed)
Occupational Therapy Assessment and Plan  Patient Details  Name: [name] [name]. MRN: [id number] Date of Birth: Nov 04, 1951  OT Diagnosis: acute pain, muscle weakness (generalized) and pain in joint Rehab Potential: Rehab Potential (ACUTE ONLY): Good ELOS: 10-12 days   Today's Date: 09/16/2019 OT Individual Time: 1245-8099 OT Individual Time Calculation (min): 55 min     Problem List:  Patient Active Problem List   Diagnosis Date Noted  . Right thalamic infarction ([place])   . Hypoalbuminemia due to protein-calorie malnutrition ([place])   . Hypokalemia   . Hypomagnesemia   . Diabetes mellitus type 2 in obese ([place])   . Diskitis 09/15/2019  . Suspected infectious meningitis 09/01/2019  . MSSA bacteremia 08/30/2019  . Discitis of lumbar region 08/30/2019  . Left shoulder pain 08/30/2019  . Septic arthritis of sternoclavicular joint, left ([place]) 08/30/2019  . Myelopathy ([place]) 08/27/2019  . H/O optic neuritis 08/27/2019  . Acute back pain with sciatica, right 09/04/2017  . Right hip pain 09/04/2017  . Thrombocytopenia ([place]) 06/25/2016  . Atrial fibrillation ([place]) 01/01/2016  . 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 Iliac aneurysm ([place])   . Morbid obesity ([place]) 07/08/2014  . Type 2 diabetes mellitus with other circulatory complications ([place]) 83/38/2505  . Chronic anticoagulation 12/21/2013  . Sleep disorder 06/21/2013  . Decreased hearing 08/06/2012  . Hemorrhoids 08/06/2012  . High risk medication use 05/05/2012  . Anticoagulation management encounter 05/05/2012  . Retinal tear 04/29/2012  . Personal history of colonic polyps 05/05/2011  . Visit for preventive health examination 05/05/2011  . PALPITATIONS 08/16/2010  . LIVER FUNCTION TESTS, ABNORMAL 04/25/2010  . Lateral epicondylitis 10/24/2009  . TOBACCO USE, QUIT 10/24/2009  . OBESITY, UNSPECIFIED 04/24/2009  . Hyperlipidemia 03/22/2009  . ERECTILE DYSFUNCTION 03/22/2009  . Obstructive sleep apnea 03/22/2009  . ULNAR  NEUROPATHY, LEFT 03/22/2009  . MYOCARDIAL INFARCTION, HX OF 03/22/2009  . Coronary atherosclerosis 03/22/2009  . Atrial fibrillation with RVR ([place]) 03/22/2009  . GERD 03/22/2009    Past Medical History:  Past Medical History:  Diagnosis Date  . Atrial fibrillation ([place]) 03/22/2009   a. s/p multiple DCCV; b. no coumadin due to low TE risk profile; c. Tikosyn Rx  . Coronary atherosclerosis of native coronary artery 11/2002   a. s/p stent to LAD 12/03; OM2 occluded at cath 12/03; d. myoview 5/10: no ischemia;  e. echo 7/11: EF 55%, BAE, mild RVE, PASP 41-45; Myoview was in March 2013. There was no ischemia or infarction, EF 51%   . Cutaneous abscess of back excluding buttocks 07/04/2014   Appears to stem from possibly a cyst very large area 6 cm contact surgeon office   . Diabetes mellitus without complication ([place])   . Drusen body    see opth note  . ERECTILE DYSFUNCTION 03/22/2009  . GERD 03/22/2009  . HYPERGLYCEMIA 04/25/2010  . HYPERLIPIDEMIA 03/22/2009  . 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 LATERAL EPICONDYLITIS, LEFT 10/24/2009  . LIVER FUNCTION TESTS, ABNORMAL 04/25/2010  . Local reaction to immunization 05/05/2012   minor resolving  zostavax   . Myocardial infarction ([place]) mi2003  . Numbness in left leg    foot related to back disease and surgery  . Obesity, unspecified 04/24/2009  . Perforated appendicitis with necrosis s/p open appendectomy 06/07/14 06/04/2014  . Renal cyst    Characterized by MRI as simple  . Ruptured suppurative appendicitis    2015   . SLEEP APNEA, OBSTRUCTIVE  03/22/2009   compliant with CPAP  . THROMBOCYTOPENIA 08/16/2010  . TOBACCO USE, QUIT 10/24/2009  . 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 Umbilical hernia    Past Surgical History:  Past Surgical History:  Procedure Laterality Date  . APPENDECTOMY N/A 06/06/2014   Procedure: APPENDECTOMY;  Surgeon: Odis Hollingshead, MD;  Location: [facility];  Service: General;  Laterality:  N/A;  . COLON SURGERY    . COLONOSCOPY  11/15/2011   Procedure: COLONOSCOPY;  Surgeon: Juanita Craver, MD;  Location: [facility];  Service: Endoscopy;  Laterality: N/A;  . COLONOSCOPY WITH PROPOFOL N/A 01/03/2017   Procedure: COLONOSCOPY WITH PROPOFOL;  Surgeon: Carol Ada, MD;  Location: [facility];  Service: Endoscopy;  Laterality: N/A;  . cyst on epiglottis  08/2002  . ESOPHAGOGASTRODUODENOSCOPY  11/15/2011   Procedure: ESOPHAGOGASTRODUODENOSCOPY (EGD);  Surgeon: Juanita Craver, MD;  Location: [facility];  Service: Endoscopy;  Laterality: N/A;  . LAPAROSCOPIC APPENDECTOMY N/A 06/06/2014   Procedure: APPENDECTOMY LAPAROSCOPIC attemted;  Surgeon: Odis Hollingshead, MD;  Location: [facility];  Service: General;  Laterality: N/A;  . LUMBAR DISC SURGERY     two  holes in spinalcovering  . stent     LAD DUS 2004  . STERNAL WOUND DEBRIDEMENT Left 08/31/2019   Procedure: LEFT STERNOCLAVICULAR WOUND DEBRIDEMENT WITH APPLICATION OF WOUND VAC;  Surgeon: Grace Isaac, MD;  Location: [facility];  Service: Thoracic;  Laterality: Left;  . surgery l4-l5   1998   ruptured x 3  . ulnar neuropathy    . 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 He wastreated with IV steroidsovernightand dischargedto homeon taper. He developed RLE cellulitis post dischargewhich resolved withdoxycylinebut he started developing progressive back pain with poor po intake, inability to walk as well as bilateral shoulder pain (bilateral clavicle to chest wall and up his neck with inability to move BUE. He was admitted for work up on 08/27/19 and found to be dehydrated with with BUN 36, had leucocytosis  with WBC 12.0 and elevated CRP-1.3. Neurology consulted for input and recommended MRI brain as well as spine to rule out infection v/s NMO/MS v/s PMR v/s siff person syndrome. CTA head neck was negative for evidence of CSF vasculitis and showed 50% stenosis left SA origin and 30-50% stenosis R-VA V4 segment. MRI brain showed punctate infarct in right thalamus and in splenium corpus callosum question micro embolic from posterior circulation. MRI cervical. Thoracic and lumbar spine showed moderate to sever lumbar foraminal stenosis with bone marrow signal changes in L4/5 with mild paravertebral inflammatory changes and cervical spondylosis with canal narrowing C4/5. He developed fevers and blood cultures done positive for MSSA. MRI sternum ordered due to swelling and showed septic left [place] joint with early osteomyelitis involving distal clavicle and significant soft tissue stranding with edema and left pectoralis muscular edema.   Dr. Servando Snare consulted and patient taken to OR 09/01/19 for I &D left [place] joint with application of wound VAC. Dr. Leonel Ramsay felt that diffuse weakness was improving and most likely felt to be due to pain from diskitis/osteomyelitis but recommended meningitic dosage for coverage of MSSA bacteremia due to possibility for spread to meningitis. ID (Dr.Comer) following for input  and recommended Nafcillin X 14 days with change to cefazolin 9/17-->10/12/19. 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 Reactive leucocytosis and thrombocytopenia is resolving. Hypokalemia and hypomagnesemia has resolved with supplementation.   Patient transferred to CIR on 09/15/2019 .    Patient currently requires max with basic self-care skills secondary to muscle weakness, decreased cardiorespiratoy endurance and decreased sitting balance, decreased standing balance,  decreased postural control, decreased balance strategies and difficulty maintaining precautions.  Prior to hospitalization, patient could complete ADLs/IADLs with independent .  Patient will benefit from skilled intervention to decrease level of assist with basic self-care skills and increase independence with basic self-care skills prior to discharge home with care partner.  Anticipate patient will require intermittent supervision and follow up home health.  OT - End of Session Activity Tolerance: Tolerates < 10 min activity, no significant change in vital signs Endurance Deficit: Yes Endurance Deficit Description: Unable to tolerate full session/ transition beyond EOB OT Assessment Rehab Potential (ACUTE ONLY): Good OT Barriers to Discharge: Decreased caregiver support OT Barriers to Discharge Comments: Wife is disabled and only able to provide supervision assist OT Patient demonstrates impairments in the following area(s): Balance;Safety;Endurance;Pain OT Basic ADL's Functional Problem(s): Grooming;Bathing;Dressing;Toileting OT Advanced ADL's Functional Problem(s): Simple Meal Preparation OT Transfers Functional Problem(s): Toilet;Tub/Shower OT Additional Impairment(s): None OT Plan OT Intensity: Minimum of 1-2 x/day, 45 to 90 minutes OT Frequency: 5 out of 7 days OT Duration/Estimated Length of Stay: 10-12 days OT Treatment/Interventions: Balance/vestibular training;Discharge planning;Pain management;Self Care/advanced ADL retraining;Therapeutic Activities;UE/LE Coordination activities;Therapeutic Exercise;Patient/family education;Functional mobility training;Community reintegration;DME/adaptive equipment instruction;Neuromuscular re-education;Psychosocial support;UE/LE Strength taining/ROM;Wheelchair propulsion/positioning OT Self Feeding Anticipated Outcome(s): Indep OT Basic Self-Care Anticipated Outcome(s): Supervision/set-up OT Toileting Anticipated Outcome(s): mod I OT Bathroom  Transfers Anticipated Outcome(s): Supervision-mod I OT Recommendation Recommendations for Other Services: Neuropsych consult;Therapeutic Recreation consult Therapeutic Recreation Interventions: Kitchen group;Outing/community reintergration Patient destination: Home Follow Up Recommendations: Home health OT Equipment Recommended: To be determined   Skilled Therapeutic Intervention Pt seen for OT eval and session focusing on functional mobility and pain management. Pt in supine upon arrival, agreeable to tx session. Reports increased pain/stiffness in lower back. RN aware already aware and administered pain medication at start of session.  Pt required SIGNIFICANTLY increased time with all mobility 2/2 muscle pain and bracing in anticipation of pain.  Completed log roll using hospital bed functions to transfer to EOB with max A. Increased time and assist to adjust hips on EOB with heavy UE reliance due to pain. Pt able to touch painful spot in lower back. Muscle knot felt and light massage/pallpation provided by therapist with pt reporting relieve in pressure. Completed sit>stand from highly elevated EOB with +2 assist to RW. Pt tolerating ~5 seconds in standing before requiring seated rest break and then immediate return to supine, declining any OOB tx/ more mobility.  Kinesiotape applied to pressure area in lower back and heat pack provided. PA made aware of pt's complaints of muscle type pain and K-pad and muscle rub prescribed by PA. Pt left in supine at end of session, all needs in reach and bed alarm on.   OT Evaluation Precautions/Restrictions  Precautions Precautions: Fall Precaution Comments: wound vac L [place] joint  Required Braces or Orthoses: Spinal Brace Spinal Brace: Lumbar corset;Applied in sitting position Restrictions Weight Bearing Restrictions: No General Chart Reviewed: Yes Home Living/Prior Functioning Home Living Family/patient expects to be discharged  to:: Private  residence Living Arrangements: Spouse/significant other Available Help at Discharge: Family, Available PRN/intermittently(Pt's wife unable to provide physical assist.) Type of Home: House Home Access: Stairs to enter Technical brewer of Steps: 2-3 Entrance Stairs-Rails: Right Home Layout: One level Bathroom Shower/Tub: Tub/shower unit  Lives With: Spouse IADL History Homemaking Responsibilities: Yes Current License: Yes Occupation: Retired Type of Occupation: Retired Company secretary Level of Independence: Independent with gait, Independent with transfers  Able to [place]?: Yes Driving: Yes Vocation: Retired Comments: retired Lexicographer Baseline Vision/History: Wears glasses Wears Glasses: At all times Patient Visual Report: No change from baseline Vision Assessment?: No apparent visual deficits Perception  Perception: Within Functional Limits Praxis Praxis: Intact Cognition Overall Cognitive Status: Within Functional Limits for tasks assessed Arousal/Alertness: Awake/alert Orientation Level: Person;Place;Situation Person: Oriented Place: Oriented Situation: Oriented Year: 2020 Month: September Day of Week: Incorrect Memory: Appears intact Immediate Memory Recall: Sock;Blue;Bed Memory Recall Sock: Without Cue Memory Recall Blue: With Cue Memory Recall Bed: Without Cue Attention: Focused Focused Attention: Appears intact Awareness: Appears intact Problem Solving: Appears intact Safety/Judgment: Appears intact Sensation Sensation Light Touch: Impaired Detail Peripheral sensation comments: absent in L LE since back surgery several years ago Light Touch Impaired Details: Impaired LLE Proprioception: Appears Intact Coordination Gross Motor Movements are Fluid and Coordinated: No Fine Motor Movements are Fluid and Coordinated: Yes Coordination and Movement Description: impaired 2/2 pain and generalized weakness Motor  Motor Motor:  Abnormal postural alignment and control Motor - Skilled Clinical Observations: impaired 2/2 pain and low back spasms Trunk/Postural Assessment  Cervical Assessment Cervical Assessment: Exceptions to WFL(Forward head) Thoracic Assessment Thoracic Assessment: Exceptions to WFL(Rounded shoulders; kyphotic) Lumbar Assessment Lumbar Assessment: Exceptions to WFL(Lumbar corsett) Postural Control Postural Control: Deficits on evaluation  Balance Balance Balance Assessed: Yes Static Sitting Balance Static Sitting - Balance Support: Bilateral upper extremity supported;Feet supported Static Sitting - Level of Assistance: 5: Stand by assistance Static Sitting - Comment/# of Minutes: Sitting EOB Dynamic Sitting Balance Dynamic Sitting - Balance Support: Feet supported;Bilateral upper extremity supported Dynamic Sitting - Level of Assistance: 3: Mod assist;4: Min Insurance risk surveyor Standing - Balance Support: Bilateral upper extremity supported;During functional activity Static Standing - Level of Assistance: 1: +2 Total assist Static Standing - Comment/# of Minutes: +2 for safety 2/2 pain/weakness limiting abilities Extremity/Trunk Assessment RUE Assessment RUE Assessment: Not tested General Strength Comments: Not formally assessed 2/2 pain, however, able to use independently to assist with bed mobility using bed rails LUE Assessment LUE Assessment: Not tested General Strength Comments: Not assessed 2/2 wound vac at [facility] joint, able to use remainder of UE at functional level     Refer to Care Plan for Long Term Goals  Recommendations for other services: Neuropsych and Surveyor, mining group, Stress management and Outing/community reintegration   Discharge Criteria: Patient will be discharged from OT if patient refuses treatment 3 consecutive times without medical reason, if treatment goals not met, if there is a change in medical status, if patient makes no  progress towards goals or if patient is discharged from hospital.  The above assessment, treatment plan, treatment alternatives and goals were discussed and mutually agreed upon: by patient  Deashia Soule L 09/16/2019, 3:30 PM

## 2019-09-16 NOTE — Progress Notes (Signed)
  Subjective: Developed nausea while working with PT.  He relates this to his pain medication. Otherwise no new concerns related to the chest wound.   Objective: Vital signs in last 24 hours: Temp:  [97.2 F (36.2 C)-98.1 F (36.7 C)] 97.2 F (36.2 C) (09/17 0509) Pulse Rate:  [71-84] 71 (09/17 0509) Resp:  [16-18] 18 (09/17 0509) BP: (105-129)/(67-79) 105/77 (09/17 0509) SpO2:  [93 %-99 %] 99 % (09/17 0509) Weight:  [117.8 kg] 117.8 kg (09/17 0500)     Intake/Output from previous day: 09/16 0701 - 09/17 0700 In: 180 [P.O.:180] Out: 850 [Urine:850] Intake/Output this shift: Total I/O In: -  Out: 475 [Urine:125; Drains:350]  Physical Exam: General appearance:alert, cooperative Wound:The right chest woundvac is intact and functioning appropriately. The vac drainage is thin, serous fluid with a total of 346ml recorded over the past 24 hours.   Lab Results: Recent Labs    09/15/19 0438 09/16/19 0424  WBC 8.8 9.2  HGB 14.4 13.9  HCT 41.1 42.4  PLT 176 197   BMET:  Recent Labs    09/15/19 0438 09/16/19 0424  NA 136 136  K 4.6 4.6  CL 103 102  CO2 26 26  GLUCOSE 137* 128*  BUN 15 19  CREATININE 0.92 1.06  CALCIUM 9.2 9.1    PT/INR: No results for input(s): LABPROT, INR in the last 72 hours. ABG    Component Value Date/Time   TCO2 26 03/01/2017 0258   CBG (last 3)  Recent Labs    09/15/19 1654 09/15/19 2107 09/16/19 0620  GLUCAP 101* 164* 123*    Assessment/Plan:  -POD16incision and drainage of left [place] joint abscess. The wound was clean and contracting as noted by the wound care RN on 09/15/19.  WillcontinueM-W-F dressing changes. Continue IV Cefazolin and nafcillin for MSSA cultured from the woundper ID recs.  -We will continue to follow, check CXR in am.    LOS: 1 day   [name], PA-C [phone] 09/16/2019

## 2019-09-16 NOTE — Discharge Instructions (Addendum)
Inpatient Rehab Discharge Instructions  [name] [name]. Discharge date and time:  10/02/19   Activities/Precautions/ Functional Status: Activity: no lifting, driving, or strenuous exercise for till cleared by MD Diet: diabetic diet Wound Care:  Change wound VAC on Mon, Wed, Fridays  Functional status:  ___ No restrictions     ___ Walk up steps independently ___ 24/7 supervision/assistance   ___ Walk up steps with assistance _X__ Intermittent supervision/assistance  ___ Bathe/dress independently ___ Walk with walker     _X__ Bathe/dress with assistance ___ Walk Independently    ___ Shower independently ___ Walk with assistance    ___ Shower with assistance _X__ No alcohol     ___ Return to work/school ________  COMMUNITY REFERRALS UPON DISCHARGE:   Home Health:   PT     OT     RN    Agency:  [facility] Phone:  [phone] Medical Equipment/Items Ordered:  18"x18" lightweight wheelchair with basic cushion; rolling walker; bedside commode; tub transfer bench  Agency/Supplier:  [facility]     Phone:  [phone]  Special Instructions: 1. Monitor blood sugars before meals and at bedtime.  2. IV ancef every 8 hours thorough 10/12/19. ID to provide input regarding need to add oral antibiotics.    My questions have been answered and I understand these instructions. I will adhere to these goals and the provided educational materials after my discharge from the hospital.  Patient/Caregiver Signature _______________________________ Date __________  Clinician Signature _______________________________________ Date __________  Please bring this form and your medication list with you to all your follow-up doctor's appointments. Information on my medicine - Pradaxa (dabigatran)  This medication education was reviewed with me or my healthcare representative as part of my discharge preparation.  Why was Pradaxa prescribed for you? Pradaxa was prescribed for you to  reduce the risk of forming blood clots that cause a stroke if you have a medical condition called atrial fibrillation (a type of irregular heartbeat).    What do you Need to know about PradAXa? Take your Pradaxa TWICE DAILY - one capsule in the morning and one tablet in the evening with or without food.  It would be best to take the doses about the same time each day.  The capsules should not be broken, chewed or opened - they must be swallowed whole.  Do not store Pradaxa in other medication containers - once the bottle is opened the Pradaxa should be used within FOUR months; throw away any capsules that havent been by that time.  Take Pradaxa exactly as prescribed by your doctor.  DO NOT stop taking Pradaxa without talking to the doctor who prescribed the medication.  Stopping without other stroke prevention medication to take the place of Pradaxa may increase your risk of developing a clot that causes a stroke.  Refill your prescription before you run out.  After discharge, you should have regular check-up appointments with your healthcare provider that is prescribing your Pradaxa.  In the future your dose may need to be changed if your kidney function or weight changes by a significant amount.  What do you do if you miss a dose? If you miss a dose, take it as soon as you remember on the same day.  If your next dose is less than 6 hours away, skip the missed dose.  Do not take two doses of PRADAXA at the same time.  Important Safety Information A possible side effect of Pradaxa is bleeding. You should  call your healthcare provider right away if you experience any of the following: ? Bleeding from an injury or your nose that does not stop. ? Unusual colored urine (red or dark brown) or unusual colored stools (red or black). ? Unusual bruising for unknown reasons. ? A serious fall or if you hit your head (even if there is no bleeding).  Some medicines may interact with Pradaxa and  might increase your risk of bleeding or clotting while on Pradaxa. To help avoid this, consult your healthcare provider or pharmacist prior to using any new prescription or non-prescription medications, including herbals, vitamins, non-steroidal anti-inflammatory drugs (NSAIDs) and supplements.  This website has more information on Pradaxa (dabigatran): [email or web address]://[email or web address].com

## 2019-09-16 NOTE — Evaluation (Signed)
Physical Therapy Assessment and Plan  Patient Details  Name: [name] [name]. MRN: [id number] Date of Birth: January 02, 1951  PT Diagnosis: Abnormal posture, Abnormality of gait, Difficulty walking, Low back pain and Muscle spasms Rehab Potential: Good ELOS: 12-14   Today's Date: 09/16/2019 PT Individual Time: 0800-0900 PT Individual Time Calculation (min): 60 min    Problem List:  Patient Active Problem List   Diagnosis Date Noted  . Right thalamic infarction ([place])   . Hypoalbuminemia due to protein-calorie malnutrition ([place])   . Hypokalemia   . Hypomagnesemia   . Diabetes mellitus type 2 in obese ([place])   . Diskitis 09/15/2019  . Suspected infectious meningitis 09/01/2019  . MSSA bacteremia 08/30/2019  . Discitis of lumbar region 08/30/2019  . Left shoulder pain 08/30/2019  . Septic arthritis of sternoclavicular joint, left ([place]) 08/30/2019  . Myelopathy ([place]) 08/27/2019  . H/O optic neuritis 08/27/2019  . Acute back pain with sciatica, right 09/04/2017  . Right hip pain 09/04/2017  . Thrombocytopenia ([place]) 06/25/2016  . Atrial fibrillation ([place]) 01/01/2016  . 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 Iliac aneurysm ([place])   . Morbid obesity ([place]) 07/08/2014  . Type 2 diabetes mellitus with other circulatory complications ([place]) 70/62/3762  . Chronic anticoagulation 12/21/2013  . Sleep disorder 06/21/2013  . Decreased hearing 08/06/2012  . Hemorrhoids 08/06/2012  . High risk medication use 05/05/2012  . Anticoagulation management encounter 05/05/2012  . Retinal tear 04/29/2012  . Personal history of colonic polyps 05/05/2011  . Visit for preventive health examination 05/05/2011  . PALPITATIONS 08/16/2010  . LIVER FUNCTION TESTS, ABNORMAL 04/25/2010  . Lateral epicondylitis 10/24/2009  . TOBACCO USE, QUIT 10/24/2009  . OBESITY, UNSPECIFIED 04/24/2009  . Hyperlipidemia 03/22/2009  . ERECTILE DYSFUNCTION 03/22/2009  . Obstructive sleep apnea 03/22/2009  . ULNAR  NEUROPATHY, LEFT 03/22/2009  . MYOCARDIAL INFARCTION, HX OF 03/22/2009  . Coronary atherosclerosis 03/22/2009  . Atrial fibrillation with RVR ([place]) 03/22/2009  . GERD 03/22/2009    Past Medical History:  Past Medical History:  Diagnosis Date  . Atrial fibrillation ([place]) 03/22/2009   a. s/p multiple DCCV; b. no coumadin due to low TE risk profile; c. Tikosyn Rx  . Coronary atherosclerosis of native coronary artery 11/2002   a. s/p stent to LAD 12/03; OM2 occluded at cath 12/03; d. myoview 5/10: no ischemia;  e. echo 7/11: EF 55%, BAE, mild RVE, PASP 41-45; Myoview was in March 2013. There was no ischemia or infarction, EF 51%   . Cutaneous abscess of back excluding buttocks 07/04/2014   Appears to stem from possibly a cyst very large area 6 cm contact surgeon office   . Diabetes mellitus without complication ([place])   . Drusen body    see opth note  . ERECTILE DYSFUNCTION 03/22/2009  . GERD 03/22/2009  . HYPERGLYCEMIA 04/25/2010  . HYPERLIPIDEMIA 03/22/2009  . 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 LATERAL EPICONDYLITIS, LEFT 10/24/2009  . LIVER FUNCTION TESTS, ABNORMAL 04/25/2010  . Local reaction to immunization 05/05/2012   minor resolving  zostavax   . Myocardial infarction ([place]) mi2003  . Numbness in left leg    foot related to back disease and surgery  . Obesity, unspecified 04/24/2009  . Perforated appendicitis with necrosis s/p open appendectomy 06/07/14 06/04/2014  . Renal cyst    Characterized by MRI as simple  . Ruptured suppurative appendicitis    2015   . SLEEP APNEA, OBSTRUCTIVE 03/22/2009  compliant with CPAP  . THROMBOCYTOPENIA 08/16/2010  . TOBACCO USE, QUIT 10/24/2009  . 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 Umbilical hernia    Past Surgical History:  Past Surgical History:  Procedure Laterality Date  . APPENDECTOMY N/A 06/06/2014   Procedure: APPENDECTOMY;  Surgeon: Odis Hollingshead, MD;  Location: [facility];  Service: General;  Laterality:  N/A;  . COLON SURGERY    . COLONOSCOPY  11/15/2011   Procedure: COLONOSCOPY;  Surgeon: Juanita Craver, MD;  Location: [facility];  Service: Endoscopy;  Laterality: N/A;  . COLONOSCOPY WITH PROPOFOL N/A 01/03/2017   Procedure: COLONOSCOPY WITH PROPOFOL;  Surgeon: Carol Ada, MD;  Location: [facility];  Service: Endoscopy;  Laterality: N/A;  . cyst on epiglottis  08/2002  . ESOPHAGOGASTRODUODENOSCOPY  11/15/2011   Procedure: ESOPHAGOGASTRODUODENOSCOPY (EGD);  Surgeon: Juanita Craver, MD;  Location: [facility];  Service: Endoscopy;  Laterality: N/A;  . LAPAROSCOPIC APPENDECTOMY N/A 06/06/2014   Procedure: APPENDECTOMY LAPAROSCOPIC attemted;  Surgeon: Odis Hollingshead, MD;  Location: [facility];  Service: General;  Laterality: N/A;  . LUMBAR DISC SURGERY     two  holes in spinalcovering  . stent     LAD DUS 2004  . STERNAL WOUND DEBRIDEMENT Left 08/31/2019   Procedure: LEFT STERNOCLAVICULAR WOUND DEBRIDEMENT WITH APPLICATION OF WOUND VAC;  Surgeon: Grace Isaac, MD;  Location: [facility];  Service: Thoracic;  Laterality: Left;  . surgery l4-l5   1998   ruptured x 3  . ulnar neuropathy    . 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 He wastreated with IV steroidsovernightand dischargedto homeon taper. He developed RLE cellulitis post dischargewhich resolved withdoxycylinebut he started developing progressive back pain with poor po intake, inability to walk as well as bilateral shoulder pain (bilateral clavicle to chest wall and up his neck with inability to move BUE. He was admitted for work up on 08/27/19 and found to be dehydrated with with BUN 36, had leucocytosis  with WBC 12.0 and elevated CRP-1.3. Neurology consulted for input and recommended MRI brain as well as spine to rule out infection v/s NMO/MS v/s PMR v/s siff person syndrome. CTA head neck was negative for evidence of CSF vasculitis and showed 50% stenosis left SA origin and 30-50% stenosis R-VA V4 segment. MRI brain showed punctate infarct in right thalamus and in splenium corpus callosum question micro embolic from posterior circulation. MRI cervical. Thoracic and lumbar spine showed moderate to sever lumbar foraminal stenosis with bone marrow signal changes in L4/5 with mild paravertebral inflammatory changes and cervical spondylosis with canal narrowing C4/5. He developed fevers and blood cultures done positive for MSSA. MRI sternum ordered due to swelling and showed septic left [place] joint with early osteomyelitis involving distal clavicle and significant soft tissue stranding with edema and left pectoralis muscular edema.   Dr. Servando Snare consulted and patient taken to OR 09/01/19 for I &D left [place] joint with application of wound VAC. Dr. Leonel Ramsay felt that diffuse weakness was improving and most likely felt to be due to pain from diskitis/osteomyelitis but recommended meningitic dosage for coverage of MSSA bacteremia due to possibility for spread to meningitis. ID (Dr.Comer) following for input and recommended  Nafcillin X 14 days with change to cefazolin 9/17-->10/12/19. 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 Reactive leucocytosis and thrombocytopenia is resolving. Hypokalemia and hypomagnesemia has resolved with supplementation.    Patient transferred to CIR on 09/15/2019 .   Patient currently requires total with mobility secondary to muscle weakness and decreased standing balance and decreased balance strategies.  Prior to hospitalization, patient was  independent  with mobility and lived with Spouse in a House home.  Home access is 2-3Stairs to enter.  Patient will benefit from skilled PT intervention to maximize safe functional mobility, minimize fall risk and decrease caregiver burden for planned discharge home with intermittent assist.  Anticipate patient will benefit from follow up [facility] at discharge.  PT - End of Session Activity Tolerance: Tolerates 30+ min activity with multiple rests Endurance Deficit: Yes Endurance Deficit Description: frequent rest breaks during functional tasks PT Assessment Rehab Potential (ACUTE/IP ONLY): Good PT Barriers to Discharge: Decreased caregiver support;Medical stability;Home environment access/layout;Lack of/limited family support PT Patient demonstrates impairments in the following area(s): Balance;Endurance;Pain PT Transfers Functional Problem(s): Bed Mobility;Bed to Chair;Car;Furniture;Floor PT Locomotion Functional Problem(s): Ambulation;Wheelchair Mobility;Stairs PT Plan PT Intensity: Minimum of 1-2 x/day ,45 to 90 minutes PT Frequency: 5 out of 7 days PT Duration Estimated Length of Stay: 12-14 PT Treatment/Interventions: Ambulation/gait training;Balance/vestibular training;Community reintegration;Discharge planning;DME/adaptive equipment instruction;Functional mobility training;Neuromuscular re-education;Pain management;Patient/family education;Psychosocial support;Stair training;Therapeutic Activities;Therapeutic Exercise;UE/LE Strength taining/ROM;UE/LE Coordination activities;Wheelchair propulsion/positioning PT Transfers Anticipated Outcome(s): mod I PT Locomotion Anticipated Outcome(s): mod I with LRAD PT Recommendation Recommendations for Other Services: Therapeutic Recreation consult Therapeutic Recreation Interventions: Stress management Follow Up Recommendations: Home health PT Patient destination: Home Equipment Recommended: Rolling walker with 5" wheels Equipment Details: further  equipment TBD  Skilled Therapeutic Intervention Evaluation completed (see details above and below) with education on PT POC and goals and individual treatment initiated with focus on functional transfer assessment and orientation to therapy unit. Pt received semi-reclined in bed, agreeable to PT session. Pt reports intermittent pain in low back during session, increases with mobility and improves at rest. Pt received pain medication prior to start of therapy session. Supine to sit with mod A, assist needed for trunk control. Assisted pt with donning LSO while seated EOB. Pt has onset of low back spasms with transfers, decreases with seated rest break. Sit to stand with min A from elevated bed to RW. Stand pivot transfer bed to w/c with RW and min A. Pt requests to return to bed. Pt requires assist x 2 to stand from w/c seat height. Stand pivot transfer back to bed min A. Sit to supine mod A for BLE management. Pt left semi-reclined in bed with needs in reach at end of session.  PT Evaluation Precautions/Restrictions Precautions Precautions: Fall Precaution Comments: wound vac L [place] joint  Required Braces or Orthoses: Spinal Brace Spinal Brace: Lumbar corset Restrictions Weight Bearing Restrictions: No Other Position/Activity Restrictions: wound vac Home Living/Prior Functioning Home Living Available Help at Discharge: Family;Available PRN/intermittently Type of Home: House Home Access: Stairs to enter [employer] of Steps: 2-3 Entrance Stairs-Rails: Right Home Layout: One level  Lives With: Spouse Prior Function Level of Independence: Independent with gait;Independent with transfers  Able to Take Stairs?: Yes Driving: Yes 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 - History Baseline Vision: Wears glasses all the time Perception Perception: Within Functional Limits Praxis Praxis: Intact  Cognition Overall  Cognitive Status: Within Functional  Limits for tasks assessed Arousal/Alertness: Awake/alert Orientation Level: Oriented X4 Attention: Focused Focused Attention: Appears intact Memory: Appears intact Awareness: Appears intact Problem Solving: Appears intact Safety/Judgment: Appears intact Sensation Sensation Light Touch: Impaired Detail Light Touch Impaired Details: Impaired LLE(absent in distal LLE since back surgery a few years ago) Proprioception: Appears Intact Coordination Gross Motor Movements are Fluid and Coordinated: No Fine Motor Movements are Fluid and Coordinated: Yes Coordination and Movement Description: impaired 2/2 pain and generalized weakness Motor  Motor Motor: Abnormal postural alignment and control Motor - Skilled Clinical Observations: impaired 2/2 pain and low back spasms  Mobility Bed Mobility Bed Mobility: Rolling Right;Rolling Left;Supine to Sit;Sit to Supine Rolling Right: Minimal Assistance - Patient > 75% Rolling Left: Minimal Assistance - Patient > 75% Supine to Sit: Moderate Assistance - Patient 50-74% Sit to Supine: Moderate Assistance - Patient 50-74% Transfers Transfers: Sit to Stand;Stand Pivot Transfers Sit to Stand: 2 Helpers Stand Pivot Transfers: Minimal Assistance - Patient > 75% Stand Pivot Transfer Details: Verbal cues for technique;Verbal cues for precautions/safety;Verbal cues for safe use of DME/AE Transfer (Assistive device): Rolling walker Locomotion  Gait Gait Distance (Feet): 50 Feet Assistive device: Rolling walker Gait Gait Pattern: Impaired(trunk flexed, shoulders elevated) Gait velocity: decreased Stairs / Additional Locomotion Stairs: No Wheelchair Mobility Wheelchair Mobility: Yes Wheelchair Assistance: Chartered loss adjuster: Both upper extremities Wheelchair Parts Management: Needs assistance Distance: 50  Trunk/Postural Assessment  Cervical Assessment Cervical Assessment: Exceptions to WFL(forward head) Thoracic  Assessment Thoracic Assessment: Exceptions to WFL(rounded shoulders) Lumbar Assessment Lumbar Assessment: Exceptions to WFL(lumbar corset) Postural Control Postural Control: Within Functional Limits  Balance Balance Balance Assessed: Yes Static Sitting Balance Static Sitting - Balance Support: Bilateral upper extremity supported;Feet supported Static Sitting - Level of Assistance: 5: Stand by assistance Dynamic Sitting Balance Dynamic Sitting - Balance Support: No upper extremity supported;Feet supported;During functional activity Dynamic Sitting - Level of Assistance: 4: Min assist Static Standing Balance Static Standing - Balance Support: Bilateral upper extremity supported;During functional activity Static Standing - Level of Assistance: 4: Min assist Dynamic Standing Balance Dynamic Standing - Balance Support: Bilateral upper extremity supported;During functional activity Dynamic Standing - Level of Assistance: 4: Min assist Extremity Assessment   RLE Assessment RLE Assessment: Within Functional Limits Passive Range of Motion (PROM) Comments: tight HS General Strength Comments: grossly 4/5 LLE Assessment LLE Assessment: Within Functional Limits Passive Range of Motion (PROM) Comments: tight HS General Strength Comments: grossly 4/5    Refer to Care Plan for Long Term Goals  Recommendations for other services: Therapeutic Recreation  Stress management  Discharge Criteria: Patient will be discharged from PT if patient refuses treatment 3 consecutive times without medical reason, if treatment goals not met, if there is a change in medical status, if patient makes no progress towards goals or if patient is discharged from hospital.  The above assessment, treatment plan, treatment alternatives and goals were discussed and mutually agreed upon: by patient   Excell Seltzer, PT, DPT 09/16/2019, 12:31 PM

## 2019-09-16 NOTE — Progress Notes (Signed)
Put sterile water in patient's home unit for patient.  Placed unit at bedside along with mask for easy reach.  Patient stated he would place himself on when he was ready to go to bed.  No distress noted.  Will continue to monitor.

## 2019-09-16 NOTE — IPOC Note (Signed)
Individualized overall Plan of Care (IPOC) Patient Details Name: [name] [name]. MRN: [id number] DOB: [birth year]  Admitting Diagnosis: Right thalamic infarction [facility])  Hospital Problems: Principal Problem:   Right thalamic infarction [facility]) Active Problems:   Atrial fibrillation ([place])   Diskitis   Hypoalbuminemia due to protein-calorie malnutrition ([place])   Hypokalemia   Hypomagnesemia   Diabetes mellitus type 2 in obese [facility])     Functional Problem List: Nursing Endurance, Medication Management, Motor, Pain, Nutrition, Skin Integrity  PT Balance, Endurance, Pain  OT Balance, Safety, Endurance, Pain  SLP    TR         Basic ADL's: OT Grooming, Bathing, Dressing, Toileting     Advanced  ADL's: OT Simple Meal Preparation     Transfers: PT Bed Mobility, Bed to Chair, Car, [employer], Floor  OT Toilet, Metallurgist: PT Ambulation, Emergency planning/management officer, Stairs     Additional Impairments: OT None  SLP        TR      Anticipated Outcomes Item Anticipated Outcome  Self Feeding Indep  Swallowing      Basic self-care  Supervision/set-up  Toileting  mod I   Bathroom Transfers Supervision-mod I  Bowel/Bladder  Maintain continence with mod I assist  Transfers  mod I  Locomotion  mod I with LRAD  Communication     Cognition     Pain  < 4  Safety/Judgment  Mod I assist   Therapy Plan: PT Intensity: Minimum of 1-2 x/day ,45 to 90 minutes PT Frequency: 5 out of 7 days PT Duration Estimated Length of Stay: 12-14 OT Intensity: Minimum of 1-2 x/day, 45 to 90 minutes OT Frequency: 5 out of 7 days OT Duration/Estimated Length of Stay: 10-12 days      Team Interventions: Nursing Interventions Patient/Family Education, Skin Care/Wound Management, Disease Management/Prevention, Discharge Planning, Pain Management, Medication Management  PT interventions Ambulation/gait training, Training and development officer, Community reintegration, Discharge  planning, DME/adaptive equipment instruction, Functional mobility training, Neuromuscular re-education, Pain management, Patient/family education, Psychosocial support, Stair training, Therapeutic Activities, Therapeutic Exercise, UE/LE Strength taining/ROM, UE/LE Coordination activities, Wheelchair propulsion/positioning  OT Interventions Balance/vestibular training, Discharge planning, Pain management, Self Care/advanced ADL retraining, Therapeutic Activities, UE/LE Coordination activities, Therapeutic Exercise, Patient/family education, Functional mobility training, Community reintegration, Engineer, drilling, Neuromuscular re-education, Psychosocial support, UE/LE Strength taining/ROM, Wheelchair propulsion/positioning  SLP Interventions    TR Interventions    SW/CM Interventions Discharge Planning, Psychosocial Support, Patient/Family Education   Barriers to Discharge MD  Medical stability and Wound care  Nursing IV antibiotics    PT Decreased caregiver support, Medical stability, Home environment access/layout, Lack of/limited family support    OT Decreased caregiver support Wife is disabled and only able to provide supervision assist  SLP      SW       Team Discharge Planning: Destination: PT-Home ,OT- Home , SLP-  Projected Follow-up: PT-Home health PT, OT-  Home health OT, SLP-  Projected Equipment Needs: PT-Rolling walker with 5" wheels, OT- To be determined, SLP-  Equipment Details: PT-further equipment TBD, OT-  Patient/family involved in discharge planning: PT- Patient,  OT-Patient, SLP-   MD ELOS: 8-12 days. 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 He was treated with IV steroids overnight  and  discharged to home on taper. He developed RLE cellulitis post discharge which resolved with doxycyline but he started developing progressive back pain with poor po intake, inability to walk as well as bilateral shoulder pain (bilateral clavicle to chest wall and up his neck with inability to move BUE.  He was admitted for work up on 08/27/19 and found to be dehydrated with with BUN 36, had leucocytosis with WBC 12.0 and elevated CRP-1.3.   Neurology consulted for input and recommended MRI brain as well as spine to rule out infection v/s NMO/MS v/s PMR v/s siff person syndrome. CTA head neck was negative for evidence of CSF vasculitis and showed 50% stenosis left SA origin and 30-50% stenosis R-VA V4 segment.  MRI brain showed punctate infarct in right thalamus and in splenium corpus callosum question micro embolic from posterior circulation. MRI cervical. Thoracic and lumbar spine showed moderate to sever lumbar foraminal stenosis with bone marrow signal changes in L4/5 with mild paravertebral inflammatory changes and cervical spondylosis with canal narrowing C4/5. He developed fevers and blood cultures done positive for MSSA.   MRI sternum ordered due to swelling and showed septic left [place] joint with early osteomyelitis involving distal clavicle and significant soft tissue stranding with edema and left pectoralis muscular edema. Dr. Servando Snare consulted and patient taken to OR 09/01/19  for I & D left [place] joint with application of wound VAC. Dr. Leonel Ramsay felt that diffuse weakness was improving and most likely felt to be due to pain from diskitis/osteomyelitis but recommended meningitic dosage for coverage of MSSA bacteremia due to possibility for spread to meningitis. ID (Dr.Comer)  following for input and recommended Nafcillin  X 14 days with change to cefazolin 9/17-->10/12/19.  Left shoulder MRI ordered due to ongoing shoulder pain --was negative for osteo but showed RTC and bicpes tendinosis with mild to  moderate OA of AC and glenohumeral joints.  Patient with resultant functional deficits with mobility, transfers, endurance, self-care.  We will set goals for Supervision/Mod I with PT/OT.  Due to the current state of emergency, patients may not be receiving their 3-hours of Medicare-mandated therapy.  See Team Conference Notes for weekly updates to the plan of care

## 2019-09-16 NOTE — Progress Notes (Signed)
Social Work Assessment and Plan   Patient Details  Name: [name] [name]. MRN: [id number] Date of Birth: 11/21/1951  Today's Date: 09/16/2019  Problem List:  Patient Active Problem List   Diagnosis Date Noted  . Right thalamic infarction ([place])   . Hypoalbuminemia due to protein-calorie malnutrition ([place])   . Hypokalemia   . Hypomagnesemia   . Diabetes mellitus type 2 in obese ([place])   . Diskitis 09/15/2019  . Suspected infectious meningitis 09/01/2019  . MSSA bacteremia 08/30/2019  . Discitis of lumbar region 08/30/2019  . Left shoulder pain 08/30/2019  . Septic arthritis of sternoclavicular joint, left ([place]) 08/30/2019  . Myelopathy ([place]) 08/27/2019  . H/O optic neuritis 08/27/2019  . Acute back pain with sciatica, right 09/04/2017  . Right hip pain 09/04/2017  . Thrombocytopenia ([place]) 06/25/2016  . Atrial fibrillation ([place]) 01/01/2016  . 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 Iliac aneurysm ([place])   . Morbid obesity ([place]) 07/08/2014  . Type 2 diabetes mellitus with other circulatory complications ([place]) 66/59/9357  . Chronic anticoagulation 12/21/2013  . Sleep disorder 06/21/2013  . Decreased hearing 08/06/2012  . Hemorrhoids 08/06/2012  . High risk medication use 05/05/2012  . Anticoagulation management encounter 05/05/2012  . Retinal tear 04/29/2012  . Personal history of colonic polyps 05/05/2011  . Visit for preventive health examination 05/05/2011  . PALPITATIONS 08/16/2010  . LIVER FUNCTION TESTS, ABNORMAL 04/25/2010  . Lateral epicondylitis 10/24/2009  . TOBACCO USE, QUIT 10/24/2009  . OBESITY, UNSPECIFIED 04/24/2009  . Hyperlipidemia 03/22/2009  . ERECTILE DYSFUNCTION 03/22/2009  . Obstructive sleep apnea 03/22/2009  . 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 MYOCARDIAL INFARCTION, HX OF 03/22/2009  . Coronary atherosclerosis 03/22/2009  . Atrial fibrillation with RVR ([place]) 03/22/2009  . GERD 03/22/2009   Past Medical History:  Past Medical History:   Diagnosis Date  . Atrial fibrillation ([place]) 03/22/2009   a. s/p multiple DCCV; b. no coumadin due to low TE risk profile; c. Tikosyn Rx  . Coronary atherosclerosis of native coronary artery 11/2002   a. s/p stent to LAD 12/03; OM2 occluded at cath 12/03; d. myoview 5/10: no ischemia;  e. echo 7/11: EF 55%, BAE, mild RVE, PASP 41-45; Myoview was in March 2013. There was no ischemia or infarction, EF 51%   . Cutaneous abscess of back excluding buttocks 07/04/2014   Appears to stem from possibly a cyst very large area 6 cm contact surgeon office   . Diabetes mellitus without complication ([place])   . Drusen body    see opth note  . ERECTILE DYSFUNCTION 03/22/2009  . GERD 03/22/2009  . HYPERGLYCEMIA 04/25/2010  . HYPERLIPIDEMIA 03/22/2009  . 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 LATERAL EPICONDYLITIS, LEFT 10/24/2009  . LIVER FUNCTION TESTS, ABNORMAL 04/25/2010  . Local reaction to immunization 05/05/2012   minor resolving  zostavax   . Myocardial infarction ([place]) mi2003  . Numbness in left leg    foot related to back disease and surgery  . Obesity, unspecified 04/24/2009  . Perforated appendicitis with necrosis s/p open appendectomy 06/07/14 06/04/2014  . Renal cyst    Characterized by MRI as simple  . Ruptured suppurative appendicitis    2015   . SLEEP APNEA, OBSTRUCTIVE 03/22/2009   compliant with CPAP  . THROMBOCYTOPENIA 08/16/2010  . TOBACCO USE, QUIT 10/24/2009  . 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 Umbilical hernia    Past Surgical History:  Past Surgical History:  Procedure Laterality Date  . APPENDECTOMY N/A 06/06/2014   Procedure: APPENDECTOMY;  Surgeon: Odis Hollingshead, MD;  Location: [facility];  Service: General;  Laterality: N/A;  . COLON SURGERY    . COLONOSCOPY  11/15/2011   Procedure: COLONOSCOPY;  Surgeon: Juanita Craver, MD;  Location: [facility];  Service: Endoscopy;  Laterality: N/A;  . COLONOSCOPY WITH PROPOFOL N/A 01/03/2017   Procedure:  COLONOSCOPY WITH PROPOFOL;  Surgeon: Carol Ada, MD;  Location: [facility];  Service: Endoscopy;  Laterality: N/A;  . cyst on epiglottis  08/2002  . ESOPHAGOGASTRODUODENOSCOPY  11/15/2011   Procedure: ESOPHAGOGASTRODUODENOSCOPY (EGD);  Surgeon: Juanita Craver, MD;  Location: [facility];  Service: Endoscopy;  Laterality: N/A;  . LAPAROSCOPIC APPENDECTOMY N/A 06/06/2014   Procedure: APPENDECTOMY LAPAROSCOPIC attemted;  Surgeon: Odis Hollingshead, MD;  Location: [facility];  Service: General;  Laterality: N/A;  . LUMBAR DISC SURGERY     two  holes in spinalcovering  . stent     LAD DUS 2004  . STERNAL WOUND DEBRIDEMENT Left 08/31/2019   Procedure: LEFT STERNOCLAVICULAR WOUND DEBRIDEMENT WITH APPLICATION OF WOUND VAC;  Surgeon: Grace Isaac, MD;  Location: [facility];  Service: Thoracic;  Laterality: Left;  . surgery l4-l5   1998   ruptured x 3  . ulnar neuropathy    . UMBILICAL HERNIA REPAIR     mesh   Social History:  reports that he quit smoking about 10 years ago. His smoking use included cigarettes. He has a 84.00 pack-year smoking history. He has never used smokeless tobacco. He reports that he does not drink alcohol or use drugs.  Family / Support Systems Marital Status: Married How Long?: 10 years Patient Roles: Spouse, Parent, Other (Comment)(step-father; friend, grandfather) Spouse/Significant Other: Orlanda Lemmerman - wife - [phone] (h); [phone] (m) Children: Twain Stenseth - son - [phone] Other Supports: step-son Anticipated Caregiver: Orlando Penner - can provide supervision; son can assist some Ability/Limitations of Caregiver: disabled, can only provide supervision Caregiver Availability: 24/7 Family Dynamics: supportive family  Social History Preferred language: English Religion: Protestant Read: Yes Write: Yes 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   Abuse/Neglect Abuse/Neglect Assessment Can Be Completed: Yes Physical Abuse: Denies Verbal Abuse: Denies Sexual Abuse: Denies Exploitation of patient/patient's resources: Denies Self-Neglect: Denies  Emotional Status Pt's affect, behavior and adjustment status: Pt admits to feeling down when he was in so much pain and even since he's been in the hospital.  But, the more he is able to get up and move about and start rehabilitating, he is beginning to feel better and see that things could get better. Recent Psychosocial Issues: Pt assists his wife quite a bit. Psychiatric History: none reported Substance Abuse History: none reported  Patient / Family Perceptions, Expectations & Goals Pt/Family understanding of illness & functional limitations: Pt reports a good understanding of his condition and limitations. Premorbid pt/family roles/activities: Pt cooks, cleans the house (recently hired someone to help with house cleaning), runs errands, pay the bills, spend time out and about with friends,spend time with his 3 grandchildren etc. Anticipated changes in roles/activities/participation: Pt realizes that he will not be able to drive for a while, but hopes to resume his other activities soon. Pt/family expectations/goals: Pt wants "to get back to normal and get back to doing what I am used to doing."  Community Resources Express Scripts: None Premorbid Home Care/DME Agencies: None Transportation available at discharge: family Resource referrals recommended: Neuropsychology  Discharge Planning Living Arrangements: Spouse/significant other Support Systems: Spouse/significant other, Children, Other relatives, Friends/neighbors Type of Residence: Private residence Insurance Resources: Multimedia programmer (specify)(United Electrical engineer) Financial Resources: Radio broadcast assistant  Screen Referred: No Money Management: Patient, Spouse Does the patient have any problems obtaining your medications?: No Home Management: Pt does a lot of this.  His family will assist. Patient/Family Preliminary Plans: Pt plans to go home where his wife can provide 24/7 supervison,as needed.  Pt is starting to think about who will be able to assist with running errands, meals, etc. Social Work Anticipated Follow Up Needs: HH/OP Expected length of stay: 10 to 14 days  Clinical Impression CSW met with pt to introduce self and role of CSW, as well as to complete assessment.  Pt is hopeful and motivated about his recovery.  He admits to feeling down, especially when he was in pain, but stated that he prayed a lot and gave himself some positive self-talk and was able to push through this difficult time.  He reports feeling better each day and has good family/friend support.  Pt is already happy with his progress on day of evaluations.  Pt has a wife at home who can provide supervision and a son and step-son who can likely assist, as well.  Pt enjoys spending time with his 3 grandchildren and he looks forward to getting better for them.  Wife has back problems, so cannot physically assist.  Pt has no current concerns/questions/needs at this time.  CSW will meet pt's son when he comes to visit and will talk with wife via telephone.    Copeland Lapier, Silvestre Mesi 09/16/2019, 10:14 PM

## 2019-09-16 NOTE — Progress Notes (Signed)
Initial Nutrition Assessment  DOCUMENTATION CODES:   Obesity unspecified, Non-severe (moderate) malnutrition in context of acute illness/injury  INTERVENTION:   - Continue MVI with minerals daily  - Continue Pro-stat 30 ml BID, each supplement provides 100 kcal and 15 grams of protein  - Magic cup TID with meals, each supplement provides 290 kcal and 9 grams of protein  - Double protein portions TID with meals  - d/c Ensure Enlive  NUTRITION DIAGNOSIS:   Moderate Malnutrition related to acute illness (septic left [place] joint s/p I&D, osteomyelitis) as evidenced by mild muscle depletion, percent weight loss (6.6% weight loss in less than 2 months).  GOAL:   Patient will meet greater than or equal to 90% of their needs  MONITOR:   PO intake, Supplement acceptance, Labs, Weight trends, Skin  REASON FOR ASSESSMENT:   Malnutrition Screening Tool    ASSESSMENT:   68 year old male with PMH of CAD, CAF, T2DM, OSA on CPAP, prior lumbar decompression, recent diagnosis of thoracic aneurysm, NAION right eye with recent admission 8/5-08/05/19 to [facility] for left optic nerve inflammation which was found on routine follow up. Pt developed RLE cellulitis post discharge which resolved with doxycyline but he started developing progressive back pain with poor PO intake, inability to walk as well as bilateral shoulder pain. Pt was admitted for work-up on 08/27/19 and found to be dehydrated, had leucocytosis and elevated CRP. Neurology consulted for input and recommended MRI brain as well as spine to rule out infection vs NMO/MS vs PMR vs siff person syndrome. CTA head neck was negative for evidence of CSF vasculitis and showed 50% stenosis left SA origin and 30-50% stenosis R-VA V4 segment. MRI brain showed punctate infarct in right thalamus and in splenium corpus callosum question micro embolic from posterior circulation. MRI cervical. Thoracic and lumbar spine showed moderate to sever lumbar foraminal  stenosis with bone marrow signal changes in L4/5 with mild paravertebral inflammatory changes and cervical spondylosis with canal narrowing C4/5. He developed fevers and blood cultures done positive for MSSA. MRI sternum ordered due to swelling and showed septic left [place] joint with early osteomyelitis involving distal clavicle and significant soft tissue stranding with edema and left pectoralis muscular edema. Pt taken to OR 09/01/19 for I&D left [place] joint with application of wound VAC. 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 Pt admitted to CIR on 9/16.   Spoke with RN prior to speaking with pt. RN reports pt has lost a lot of weight due to not eating while feeling so poorly but that his appetite seems to have improved recently.  Spoke with pt at bedside. Pt reports feeling tired from therapies but overall doing well. RD noted emesis bag on pt's bedside tray. Pt states he had some nausea this morning before breakfast which he attributes to his pain medications. Pt reports that his nausea has now improved.  Pt reports that 3 years ago he weighed 350 lbs and decided to start working out and eating better. Pt reports he began walking and lifting weights and adjusted his diet and was able to lose weight down to 280 lbs. Pt reports that this is where he had been maintaining his weight prior to August of this year.  Pt reports that in August of this year, he began having multiple medical problems and multiple hospitalizations. Pt reports he was not eating much when  at home or during his hospital admissions and losing about 20 lbs over the last 1.5 months. Pt reports his current weight is around 260 lbs.  Reviewed weight history in chart. Noted pt with an 8.3 kg weight loss since 07/21/19. This is a 6.6% weight loss in less than 2 months which is significant for timeframe.  Pt reports  that he cannot tolerate any of the oral nutrition supplements like Ensure and Boost due to the taste of artificial sweeteners. Pt states he has been able to take the Pro-stat and is willing to continue taking that. RD explained the importance of adequate kcal and protein intake in building back lean muscle mass and promoting wound healing. Pt expresses understanding.  RD will continue with order for MVI with minerals and add Magic Cup TID to meals in addition to double protein portions to aid in wound healing. Pt reports he likes the food and has been eating well over the last day or two.  Meal Completion: 100% x 1 recorded meal  Medications reviewed and include: Ensure Enlive BID, Pro-stat 30 ml BID, SSI, magnesium oxide, MVI with minerals, Protonix, Miralax, K-dur 40 mEq BID, Senna, IV abx  Labs reviewed. CBG's: 101-164 x 24 hours  UOP: 850 ml x 24 hours  NUTRITION - FOCUSED PHYSICAL EXAM:    Most Recent Value  Orbital Region  No depletion  Upper Arm Region  No depletion  Thoracic and Lumbar Region  No depletion  Buccal Region  No depletion  Temple Region  No depletion  Clavicle Bone Region  Mild depletion  Clavicle and Acromion Bone Region  Mild depletion  Scapular Bone Region  Unable to assess  Dorsal Hand  No depletion  Patellar Region  Mild depletion  Anterior Thigh Region  Mild depletion  Posterior Calf Region  Mild depletion  Edema (RD Assessment)  None  Hair  Reviewed  Eyes  Reviewed  Mouth  Reviewed  Skin  Reviewed  Nails  Reviewed       Diet Order:   Diet Order            Diet heart healthy/carb modified Room service appropriate? Yes with Assist; Fluid consistency: Thin  Diet effective now              EDUCATION NEEDS:   Education needs have been addressed  Skin:  Skin Assessment: Skin Integrity Issues: Skin Integrity Issues: Wound Vac: sternum Other: MASD to sacrum  Last BM:  09/14/19  Height:   Ht Readings from Last 1 Encounters:  09/16/19 6'  1" (1.854 m)    Weight:   Wt Readings from Last 1 Encounters:  09/16/19 117.8 kg    Ideal Body Weight:  83.6 kg  BMI:  Body mass index is 34.26 kg/m.  Estimated Nutritional Needs:   Kcal:  [id number]  Protein:  125-140 grams  Fluid:  >/= 2.2 L    Gaynell Face, MS, RD, LDN Inpatient Clinical Dietitian Pager: [phone] Weekend/After Hours: [phone]

## 2019-09-16 NOTE — Progress Notes (Signed)
Physical Therapy Session Note  Patient Details  Name: [name] [name]. MRN: [id number] Date of Birth: January 04, 1951  Today's Date: 09/16/2019 PT Individual Time: 1000-1100 PT Individual Time Calculation (min): 60 min   Short Term Goals: Week 1:  PT Short Term Goal 1 (Week 1): Pt will complete sit to stand with assist x 1 consistently PT Short Term Goal 2 (Week 1): Pt will initiate stair training PT Short Term Goal 3 (Week 1): Pt will ambulate x 100 ft with LRAD and min A  Skilled Therapeutic Interventions/Progress Updates:  Pt received semi-reclined in bed, agreeable to PT session. Pt reports feeling nauseous from pain medication, received anti-nausea medication during earlier session this AM. Pt has intermittent low back pain throughout session, declines intervention. Supine to sit with mod A. Sit to stand with mod A to RW from elevated bed. Pt is dependent to don shorts while standing EOB. Ambulation x 50 ft with RW and min A with close w/c follow. Pt exhibits flexed trunk posture and B shoulder elevation with gait. When pt attempts to stand upright and relax shoulders he as onset of low back pain. Manual w/c propulsion x 50 ft with use of BUE and Supervision. Pt is setup A to brush teeth, wash face, and was glasses while seated in w/c at sink. Pt is assist x 2 to stand from w/c to RW. Car transfer with mod A for BLE management. Pt agreeable to stay seated up in w/c in room at end of session.  Therapy Documentation Precautions:  Precautions Precautions: Fall Precaution Comments: wound vac L [place] joint  Required Braces or Orthoses: Spinal Brace Spinal Brace: Lumbar corset Restrictions Weight Bearing Restrictions: No Other Position/Activity Restrictions: wound vac    Therapy/Group: Individual Therapy   Excell Seltzer, PT, DPT  09/16/2019, 12:41 PM

## 2019-09-17 ENCOUNTER — Inpatient Hospital Stay (HOSPITAL_COMMUNITY): Payer: Medicare Other

## 2019-09-17 ENCOUNTER — Inpatient Hospital Stay (HOSPITAL_COMMUNITY): Payer: Medicare Other | Admitting: Occupational Therapy

## 2019-09-17 ENCOUNTER — Inpatient Hospital Stay (HOSPITAL_COMMUNITY): Payer: Medicare Other | Admitting: Physical Therapy

## 2019-09-17 DIAGNOSIS — E1165 Type 2 diabetes mellitus with hyperglycemia: Secondary | ICD-10-CM

## 2019-09-17 DIAGNOSIS — I4891 Unspecified atrial fibrillation: Secondary | ICD-10-CM

## 2019-09-17 LAB — GLUCOSE, CAPILLARY
Glucose-Capillary: 152 mg/dL — ABNORMAL HIGH (ref 70–99)
Glucose-Capillary: 142 mg/dL — ABNORMAL HIGH (ref 70–99)
Glucose-Capillary: 116 mg/dL — ABNORMAL HIGH (ref 70–99)
Glucose-Capillary: 135 mg/dL — ABNORMAL HIGH (ref 70–99)

## 2019-09-17 MED ORDER — MAGNESIUM SULFATE 2 GM/50ML IV SOLN
2.0000 g | Freq: Once | INTRAVENOUS | Status: AC
  Administered 2019-09-17: 2 g via INTRAVENOUS
  Filled 2019-09-17: qty 50

## 2019-09-17 MED ORDER — ONDANSETRON HCL 4 MG/2ML IJ SOLN
4.0000 mg | Freq: Four times a day (QID) | INTRAMUSCULAR | Status: DC | PRN
  Administered 2019-09-17 – 2019-09-19 (×5): 4 mg via INTRAVENOUS
  Filled 2019-09-17 (×5): qty 2

## 2019-09-17 MED ORDER — METHOCARBAMOL 500 MG PO TABS
500.0000 mg | ORAL_TABLET | Freq: Two times a day (BID) | ORAL | Status: DC | PRN
  Administered 2019-09-17 – 2019-09-23 (×6): 500 mg via ORAL
  Filled 2019-09-17 (×6): qty 1

## 2019-09-17 MED ORDER — POTASSIUM CHLORIDE CRYS ER 20 MEQ PO TBCR
40.0000 meq | EXTENDED_RELEASE_TABLET | Freq: Two times a day (BID) | ORAL | Status: DC
  Administered 2019-09-17 – 2019-09-23 (×12): 40 meq via ORAL
  Filled 2019-09-17 (×12): qty 2

## 2019-09-17 MED ORDER — MAGNESIUM OXIDE 400 (241.3 MG) MG PO TABS
400.0000 mg | ORAL_TABLET | Freq: Every day | ORAL | Status: DC
  Administered 2019-09-18 – 2019-10-01 (×14): 400 mg via ORAL
  Filled 2019-09-17 (×14): qty 1

## 2019-09-17 MED ORDER — ADULT MULTIVITAMIN W/MINERALS CH
1.0000 | ORAL_TABLET | Freq: Every day | ORAL | Status: DC
  Administered 2019-09-18 – 2019-10-01 (×14): 1 via ORAL
  Filled 2019-09-17 (×14): qty 1

## 2019-09-17 MED ORDER — POLYETHYLENE GLYCOL 3350 17 G PO PACK: 17.0000 g | PACK | Freq: Every day | ORAL | Status: DC | PRN

## 2019-09-17 MED ORDER — METFORMIN HCL 500 MG PO TABS
500.0000 mg | ORAL_TABLET | Freq: Every day | ORAL | Status: DC
  Administered 2019-09-17 – 2019-09-29 (×13): 500 mg via ORAL
  Filled 2019-09-17 (×13): qty 1

## 2019-09-17 MED ORDER — ENSURE MAX PROTEIN PO LIQD
11.0000 [oz_av] | Freq: Three times a day (TID) | ORAL | Status: DC
  Filled 2019-09-17: qty 330

## 2019-09-17 MED ORDER — LIDOCAINE 5 % EX PTCH
1.0000 | MEDICATED_PATCH | CUTANEOUS | Status: DC
  Administered 2019-09-18 – 2019-09-20 (×3): 1 via TRANSDERMAL
  Filled 2019-09-17 (×7): qty 1

## 2019-09-17 NOTE — Progress Notes (Addendum)
  Subjective: Resting in bed and says he is comfortable. Wound vac already changed today.   Objective: Vital signs in last 24 hours: Temp:  [97.7 F (36.5 C)-98 F (36.7 C)] 97.7 F (36.5 C) (09/18 0511) Pulse Rate:  [74-92] 74 (09/18 0511) Resp:  [14-18] 18 (09/18 0511) BP: (114-122)/(65-70) 122/70 (09/18 0511) SpO2:  [81 %-94 %] 94 % (09/18 0511) Weight:  [109.8 kg] 109.8 kg (09/18 0500)  Hemodynamic parameters for last 24 hours:    Intake/Output from previous day: 09/17 0701 - 09/18 0700 In: 520 [P.O.:320; IV Piggyback:200] Out: 2150 [Urine:1800; Drains:350] Intake/Output this shift: No intake/output data recorded.  Physical Exam: General appearance:alert, cooperative Wound:The left chest woundvac is intact and functioning appropriately.   Lab Results: Recent Labs    09/15/19 0438 09/16/19 0424  WBC 8.8 9.2  HGB 14.4 13.9  HCT 41.1 42.4  PLT 176 197   BMET:  Recent Labs    09/15/19 0438 09/16/19 0424  NA 136 136  K 4.6 4.6  CL 103 102  CO2 26 26  GLUCOSE 137* 128*  BUN 15 19  CREATININE 0.92 1.06  CALCIUM 9.2 9.1    PT/INR: No results for input(s): LABPROT, INR in the last 72 hours. ABG    Component Value Date/Time   TCO2 26 03/01/2017 0258   CBG (last 3)  Recent Labs    09/16/19 1656 09/16/19 2121 09/17/19 0610  GLUCAP 133* 141* 152*    Assessment/Plan: S/P     LOS: 2 days   -POD17incision and drainage of left [name] joint abscess. The wound was clean and contracting as noted by the wound care RN this AM.WillcontinueM-W-F dressing changes. ABX for MSSA cultured from the woundper ID recs.  -We will continue to follow.   [name] Odea, PA-C [phone] 09/17/2019  I have seen and examined [name] [name]. and agree with the above assessment  and plan.  [name] Isaac MD Beeper [phone] Office [phone] 09/17/2019 1:15 PM

## 2019-09-17 NOTE — Progress Notes (Addendum)
Pt  complained of having a muscle spasm and pain in leg. Pt also complaining of nausea.pt received Robaxin and zofran.   Pt is diaphoretic, however has no fever . Temp is 98.3, and BP- 116/70.

## 2019-09-17 NOTE — Progress Notes (Signed)
Physical Therapy Session Note  Patient Details  Name: [name] [name]. MRN: [id number] Date of Birth: 1951-04-25  Today's Date: 09/17/2019 PT Individual Time: 1430-1508 PT Individual Time Calculation (min): 38 min  PT Missed Time: 22 min Missed Time Reason: pain  Short Term Goals: Week 1:  PT Short Term Goal 1 (Week 1): Pt will complete sit to stand with assist x 1 consistently PT Short Term Goal 2 (Week 1): Pt will initiate stair training PT Short Term Goal 3 (Week 1): Pt will ambulate x 100 ft with LRAD and min A  Skilled Therapeutic Interventions/Progress Updates:    Pt received semi-reclined in bed. Pt reports getting back to bed recently and having severe muscle spasms in low back. Pt agreeable to attempt sitting EOB this session. Pt is min A to roll onto R side with use of bedrails. Pt has onset of muscle spasms and unable to tolerate coming to sitting position. Returned to supine. Pt is able to perform several heel slides B with no increase in pain but declines any further therex at bed level. Rolling L/R with min A and use of bedrails to place k pad for muscle pain relief. Pt has onset of spasms with rolling. Pt is setup A to scoot up in bed with bed in Trendelenburg position. Pt left semi-reclined in bed with needs in reach, bed alarm in place. Pt missed 22 min of therapy session due to pain.   Therapy Documentation Precautions:  Precautions Precautions: Fall Precaution Comments: wound vac L [place] joint  Required Braces or Orthoses: Spinal Brace Spinal Brace: Lumbar corset, Applied in sitting position Restrictions Weight Bearing Restrictions: Yes Other Position/Activity Restrictions: wound vac    Therapy/Group: Individual Therapy   Excell Seltzer, PT, DPT  09/17/2019, 3:13 PM

## 2019-09-17 NOTE — Consult Note (Signed)
[facility] Nurse wound follow up Patient receiving care in [phone] Wound type: Surgical to left [place] joint Measurement: deferred Wound bed: 97% pink, 3% yellow slough Drainage (amount, consistency, odor) serosanginous Periwound:intact Dressing procedure/placement/frequency: one piece of black foam removed from wound, one piece placed. Immediate seal obtained. Patient tolerated with minimal discomfort. Val Riles, RN, MSN, CWOCN, CNS-BC, pager [phone]

## 2019-09-17 NOTE — Progress Notes (Signed)
Placed home CPAP near patient at this time with 2L bled into machine. No further assistance needed, patient wanted to place himself when he was ready. Patient understands to contact respiratory if further assistance is required. No distress noted at this time.

## 2019-09-17 NOTE — Progress Notes (Signed)
Patient reporting nausea and dizziness worse with activity and at time in bed--sounds like vertigo -- OT to evaluate.  Also anxious about multiple issues.  Antibiotics changed yesterday which could also be causing symptoms. He continues to report pain level 8-9 but is afraid to take oxycodone due to dizziness--only taking once a day and has not had any flexeril since admission. 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 Discussed that he also has antiemetics and tramadol that he could use. Changed timing of Mag Ox, K dur and multivitamin to later in the day. Changed lidocaine to start in am.  May need to premedicate with antiemetic prior to atbx.

## 2019-09-17 NOTE — Progress Notes (Signed)
Occupational Therapy Session Note  Patient Details  Name: [name] [name]. MRN: [id number] Date of Birth: 1951-10-14  Today's Date: 09/17/2019 OT Individual Time: 0900-1000 and 1100-1200 OT Individual Time Calculation (min): 60 min and 60 min   Short Term Goals: Week 1:  OT Short Term Goal 1 (Week 1): Pt will complete functoinal transfers with min A overall using LRAD OT Short Term Goal 2 (Week 1): Pt will don pants with min A sit>stand using AE PRN OT Short Term Goal 3 (Week 1): Pt will stand to complete 1 grooming task with steadying assist in order to increase functional standing balance/endurance OT Short Term Goal 4 (Week 1): Pt will don shirt with supervision/set-up  Skilled Therapeutic Interventions/Progress Updates:    Session One: Pt seen for OT session focusing on functional mobility,and  ADL re-training ADL Re-training: Bathing/dressing from w/c level at sink. Set-up/supervision UB bathing, donned hospital gown with set-up.  Max A to obtain/maintain figure four sitting position with L LE crossed in order to wash LE. Required total A for R 2/2 fatigue towards end of session. +2 required for sit>stand needed to complete buttock hygiene total A. Total A toileting Functional transfers: Mod A  +2 to stand from highly elevated EOB. Ambulated to sink with min A. Mod 2 for sit>stand from w/c level.  Ambulated into bathroom min A with RW and mod A for controlled descent onto toilet. Pt becoming diaphoretic following ambulation into bathroom with complaints of dizziness. BP 116/7. RN made aware and present at end of session. Pt left seated on toilet at end of session with RN present.   Session Two: Pt seen for OT session focusing on AE education. Pt sitting up in w/c upon arrival, tolerating 1hr up in chair btwn therapy session. Pt inconsistent on reports of pain throughout session, ranging from 0/10 with just nausea to 8/10 pain. RN and PA made aware.  PA present for portion of session  with ongoing education to pt from PA and OT regarding pain control/management, use of diffierent forms of pain meds and suspected reasons for dizziness/nausea.  Education and demonstration provided for use of reacher and sock aid to assist with LB dressing tasks. Pt able to use reacher to doff B socks and thread on pants with supervision/VCs for technique. +2 assist required to stand at RW to pull pants up. Education/demonstration for use of sock aid with pt then able to don B socks with supervision/VCs. Pt requesting to stay up in w/c at end of session in order to eat lunch prior to returning to bed to rest. Pt left with all needs in reach.   Therapy Documentation Precautions:  Precautions Precautions: Fall Precaution Comments: wound vac L [place] joint  Required Braces or Orthoses: Spinal Brace Spinal Brace: Lumbar corset, Applied in sitting position Restrictions Weight Bearing Restrictions: No Other Position/Activity Restrictions: wound vac   Therapy/Group: Individual Therapy  [name] L 09/17/2019, 6:50 AM

## 2019-09-17 NOTE — Plan of Care (Signed)
  Problem: Consults Goal: RH GENERAL PATIENT EDUCATION Description: See Patient Education module for education specifics. Outcome: Progressing Goal: Skin Care Protocol Initiated - if Braden Score 18 or less Description: If consults are not indicated, leave blank or document N/A Outcome: Progressing Goal: Diabetes Guidelines if Diabetic/Glucose > 140 Description: If diabetic or lab glucose is > 140 mg/dl - Initiate Diabetes/Hyperglycemia Guidelines & Document Interventions  Outcome: Progressing   Problem: RH SKIN INTEGRITY Goal: RH STG SKIN FREE OF INFECTION/BREAKDOWN Description: Patients skin will remain free from further infection or breakdown with min assist. Outcome: Progressing Goal: RH STG MAINTAIN SKIN INTEGRITY WITH ASSISTANCE Description: STG Maintain Skin Integrity With min Assistance. Outcome: Progressing Goal: RH STG ABLE TO PERFORM INCISION/WOUND CARE W/ASSISTANCE Description: STG Able To Perform Incision/Wound Care With total Assistance from caregiver. Outcome: Progressing   Problem: RH PAIN MANAGEMENT Goal: RH STG PAIN MANAGED AT OR BELOW PT'S PAIN GOAL Description: < 4 Outcome: Progressing

## 2019-09-17 NOTE — Progress Notes (Signed)
Brief Nutrition Note  Noted consult for poor PO intake. Please see RD Initial Nutrition Assessment dated 9/17 for additional details.  Spoke with PA who reports pt was not aware he could order items other than the two specials off of the menu. PA has liberalized his diet from Heart Healthy/Carb Modified to Carb Modified. PO intake 50-100% x last 3 recorded meals.  RD has ordered Pro-stat, [employer], and double protein portions for pt.  Spoke with pt at bedside. Pt reports that he did not eat much at breakfast or lunch due to nausea and pain. Pt states that he understands the importance of adequate PO intake in maintaining and building lean muscle mass and promoting wound healing. Pt states he did take his Pro-stat earlier.  Pt with questions about the menu. RD sat down with pt and provided comprehensive menu orientation. Pt is glad to have his diet liberalized so that he has more options at mealtimes.  RD will continue to follow pt during admission.   Gaynell Face, MS, RD, LDN Inpatient Clinical Dietitian Pager: [phone] Weekend/After Hours: [phone]

## 2019-09-17 NOTE — Progress Notes (Signed)
[place] PHYSICAL MEDICINE & REHABILITATION PROGRESS NOTE  Subjective/Complaints: Patient seen laying in bed this morning.  He states he slept well overnight.  He states he had some nausea and spasms at the end of the day yesterday.  Overall, he notes good first day of therapies.  He was seen by CT surgery yesterday, notes reviewed.  ROS: Denies CP, SOB, N/V/D  Objective: Vital Signs: Blood pressure 122/70, pulse 74, temperature 97.7 F (36.5 C), temperature source Oral, resp. rate 18, height 6\' 1"  (1.854 m), weight 109.8 kg, SpO2 94 %. No results found. Recent Labs    09/15/19 0438 09/16/19 0424  WBC 8.8 9.2  HGB 14.4 13.9  HCT 41.1 42.4  PLT 176 197   Recent Labs    09/15/19 0438 09/16/19 0424  NA 136 136  K 4.6 4.6  CL 103 102  CO2 26 26  GLUCOSE 137* 128*  BUN 15 19  CREATININE 0.92 1.06  CALCIUM 9.2 9.1    Physical Exam: BP 122/70 (BP Location: Left Arm)   Pulse 74   Temp 97.7 F (36.5 C) (Oral)   Resp 18   Ht 6\' 1"  (1.854 m)   Wt 109.8 kg   SpO2 94%   BMI 31.94 kg/m  Constitutional: NAD.  Vital signs reviewed. HENT: Normocephalic.  Atraumatic. Eyes: EOMI.  No discharge. Cardiovascular: No JVD. Respiratory: Normal effort.  No stridor. GI: Non-distended. Skin: +VAC to chest Psych: Normal mood.  Normal behavior. Musc: Left hip and sternum TTP Neurologic: Alert Motor: Right upper extremity: 4+/5 proximal to distal Left upper extremity: 4-/5 proximal to distal (some pain inhibition) Left lower extremity: 4-/5 proximal to distal Right lower extremity: 4-4/5 proximal to distal (some pain inhibition)  Assessment/Plan: 1. Functional deficits secondary to right thalamic infarct with lumbar discitis which require 3+ hours per day of interdisciplinary therapy in a comprehensive inpatient rehab setting.  Physiatrist is providing close team supervision and 24 hour management of active medical problems listed below.  Physiatrist and rehab team continue to  assess barriers to discharge/monitor patient progress toward functional and medical goals  Care Tool:  Bathing  Bathing activity did not occur: Safety/medical concerns(Unable to tolerate activity/mobility 2/2 pain)           Bathing assist       Upper Body Dressing/Undressing Upper body dressing   What is the patient wearing?: Hospital gown only    Upper body assist Assist Level: Minimal Assistance - Patient > 75%    Lower Body Dressing/Undressing Lower body dressing      What is the patient wearing?: Pants     Lower body assist Assist for lower body dressing: Dependent - Patient 0%     Toileting Toileting    Toileting assist Assist for toileting: Minimal Assistance - Patient > 75% Assistive Device Comment: urinal   Transfers Chair/bed transfer  Transfers assist     Chair/bed transfer assist level: 2 Helpers     Locomotion Ambulation   Ambulation assist      Assist level: Minimal Assistance - Patient > 75% Assistive device: Walker-rolling Max distance: 50'   Walk 10 feet activity   Assist     Assist level: Minimal Assistance - Patient > 75% Assistive device: Walker-rolling   Walk 50 feet activity   Assist    Assist level: Minimal Assistance - Patient > 75% Assistive device: Walker-rolling    Walk 150 feet activity   Assist Walk 150 feet activity did not occur: Safety/medical concerns  Walk 10 feet on uneven surface  activity   Assist Walk 10 feet on uneven surfaces activity did not occur: Safety/medical concerns         Wheelchair     Assist Will patient use wheelchair at discharge?: (TBD) Type of Wheelchair: Manual    Wheelchair assist level: Supervision/Verbal cueing Max wheelchair distance: 61'    Wheelchair 50 feet with 2 turns activity    Assist        Assist Level: Supervision/Verbal cueing   Wheelchair 150 feet activity     Assist Wheelchair 150 feet activity did not occur:  Safety/medical concerns        Medical Problem List and Plan: 1.  Weakness secondary to right thalamic infarct with lumbar discitis and osteomyelitis  Cont CIR  MRI brain and L-spine personal reviewed - diskitis appears to be on right side and punctate brain infarcts 2. Antithrombotics: -DVT/anticoagulation:Pharmaceutical:Pradexa -antiplatelet therapy: N/a 3. Pain Management:  Flexeril changed to Robaxin 500 twice daily as needed  Added lidocaine patch to lumbar paraspinals.   Continue oxycodone prn   Added tramadol prn for moderate pain. 4. Mood:Team to provide ego support and encouragement.LCSW to follow for evaluation and support -antipsychotic agents: N/A 5. Neuropsych: This patientiscapable of making decisions on hisown behalf. 6. Skin/Wound Care:See #9 7. Fluids/Electrolytes/Nutrition:Monitor I/Os. Continue prostat supplement due to low protein store and to promote healing. 8. MSSA bacteremia: Naficin every 4 thorough 9/16due to concerns of meningitic spreadthen changed to Cefazolin thorough 10/12/2019 9. Diskitis/left sternoclavicular infection: Wound VACdressing changesMWFper WOC. 10. Left biceps tendinosis/Mild-moderate OA left shoulder: Follow up with ortho post discharge.  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 PAF: Monitor HR tid--continue Tikosyn and Pradexa. 13. T2DM with hyperglycemia: Hgb A1C- 6.7and controlled on metformin BID PTA. Will monitor BS ac/hs with SSI for elevated BS.  Metformin 500 daily started on 9/18  Monitor with increased mobility 14.Emphysema: Continue Breo. Currently needing supplemental oxygen prn per patient.  15. Hypokalemia/Hypomagnesemia: Continue K dur and Mag Ox  Potassium 4.6 on 9/17  Magnesium 1.8 on 9/17 16.  Hypoalbuminemia  Supplement initiated  LOS: 2 days A FACE TO FACE EVALUATION WAS PERFORMED  [name] [name] [name] [name] 09/17/2019, 8:56 AM

## 2019-09-18 ENCOUNTER — Inpatient Hospital Stay (HOSPITAL_COMMUNITY): Payer: Medicare Other | Admitting: Occupational Therapy

## 2019-09-18 ENCOUNTER — Inpatient Hospital Stay (HOSPITAL_COMMUNITY): Payer: Medicare Other | Admitting: Physical Therapy

## 2019-09-18 DIAGNOSIS — I4891 Unspecified atrial fibrillation: Secondary | ICD-10-CM

## 2019-09-18 DIAGNOSIS — R110 Nausea: Secondary | ICD-10-CM

## 2019-09-18 LAB — BASIC METABOLIC PANEL
Anion gap: 10 (ref 5–15)
BUN: 23 mg/dL (ref 8–23)
CO2: 25 mmol/L (ref 22–32)
Calcium: 9.3 mg/dL (ref 8.9–10.3)
Chloride: 100 mmol/L (ref 98–111)
Creatinine, Ser: 1.13 mg/dL (ref 0.61–1.24)
GFR calc Af Amer: >60 mL/min (ref >60)
GFR calc non Af Amer: >60 mL/min (ref >60)
Glucose, Bld: 140 mg/dL — ABNORMAL HIGH (ref 70–99)
Potassium: 4.1 mmol/L (ref 3.5–5.1)
Sodium: 135 mmol/L (ref 135–145)

## 2019-09-18 LAB — GLUCOSE, CAPILLARY
Glucose-Capillary: 144 mg/dL — ABNORMAL HIGH (ref 70–99)
Glucose-Capillary: 140 mg/dL — ABNORMAL HIGH (ref 70–99)
Glucose-Capillary: 111 mg/dL — ABNORMAL HIGH (ref 70–99)
Glucose-Capillary: 149 mg/dL — ABNORMAL HIGH (ref 70–99)

## 2019-09-18 LAB — CBC
HCT: 40.9 % (ref 39.0–52.0)
Hemoglobin: 14.1 g/dL (ref 13.0–17.0)
MCH: 31.8 pg (ref 26.0–34.0)
MCHC: 34.5 g/dL (ref 30.0–36.0)
MCV: 92.1 fL (ref 80.0–100.0)
Platelets: 196 10*3/uL (ref 150–400)
RBC: 4.44 MIL/uL (ref 4.22–5.81)
RDW: 15.2 % (ref 11.5–15.5)
WBC: 10.1 10*3/uL (ref 4.0–10.5)
nRBC: 0 % (ref 0.0–0.2)

## 2019-09-18 NOTE — Progress Notes (Signed)
Physical Therapy Session Note  Patient Details  Name: [name] [name]. MRN: [id number] Date of Birth: 06-Dec-1951  Today's Date: 09/18/2019 PT Individual Time: 1000-1100 PT Individual Time Calculation (min): 60 min   Short Term Goals: Week 1:  PT Short Term Goal 1 (Week 1): Pt will complete sit to stand with assist x 1 consistently PT Short Term Goal 2 (Week 1): Pt will initiate stair training PT Short Term Goal 3 (Week 1): Pt will ambulate x 100 ft with LRAD and min A  Skilled Therapeutic Interventions/Progress Updates:    Pt received semi-reclined in bed, agreeable to PT session. No complaints of pain at rest. Pt reports having A-fib earlier this AM, RN and medical team aware and pt has converted back to regular rhythm. Per RN ok to work with patient this AM. Supine to sit with min A with use of bedrails. Sit to stand with mod A from elevated bed to RW, dependent to don shorts. Ambulation x 46' with RW and min A with w/c follow. Pt exhibits improved tolerance for gait training this date. Pt does report intermittent onset of nausea throughout session and ongoing dizziness. Seated BP 102/61, HR 92, SpO2 93% on room air. Sit to stand with assist x 2 from w/c seat height to stair rails. Provided pt with more elevated w/c seat cushion. Ascend/descend 2 x 3" stairs with 2 handrails and mod A. Pt initially agreeable to stay seated in w/c at end of session, then requests to return to bed due to fatigue. Stand pivot transfer w/c to bed with min A and RW. Sit to supine mod A for BLE management. Pt left semi-reclined in bed with needs in reach at end of session.  Therapy Documentation Precautions:  Precautions Precautions: Fall Precaution Comments: wound vac L [place] joint  Required Braces or Orthoses: Spinal Brace Spinal Brace: Lumbar corset, Applied in sitting position Restrictions Weight Bearing Restrictions: Yes Other Position/Activity Restrictions: wound vac    Therapy/Group: Individual  Therapy   Excell Seltzer, PT, DPT  09/18/2019, 12:41 PM

## 2019-09-18 NOTE — Progress Notes (Signed)
[place] PHYSICAL MEDICINE & REHABILITATION PROGRESS NOTE  Subjective/Complaints: Patient seen laying in bed this morning.  He states he slept well overnight, however this morning started to feel different.  Discussed with nursing.  Rapid response was called due to chest discomfort.  Patient was noted to be in A. fib.  Discussed with nurse tech as well.  Patient appears to have spontaneously cardioverted.  He was seen by CTS yesterday, notes reviewed-continue current management.  ROS: Denies CP, SOB, N/V/D  Objective: Vital Signs: Blood pressure 108/64, pulse 94, temperature 98 F (36.7 C), temperature source Oral, resp. rate 15, height 6\' 1"  (1.854 m), weight 107.3 kg, SpO2 95 %. Dg Chest Port 1 View  Result Date: 09/17/2019 CLINICAL DATA:  Left sternoclavicular joint infection. Wound VAC in place. EXAM: PORTABLE CHEST 1 VIEW COMPARISON:  Radiographs 09/01/2019 and 08/31/2019. MRI 09/01/2019 and 08/30/2019. FINDINGS: 0634 hours. New right arm PICC projects to the mid SVC level. The heart size and mediastinal contours are stable with aortic atherosclerosis. Chronic interstitial prominence is again noted throughout both lungs. The bibasilar aeration has mildly improved, consistent with resolving atelectasis. There is no confluent airspace opacity, pneumothorax or significant pleural effusion. The visualized left clavicle appears unchanged. IMPRESSION: 1. Right arm PICC tip at the mid SVC level. 2. Slight improvement in bibasilar aeration. Electronically Signed   By: Richardean Sale M.D.   On: 09/17/2019 09:29   Recent Labs    09/16/19 0424  WBC 9.2  HGB 13.9  HCT 42.4  PLT 197   Recent Labs    09/16/19 0424  NA 136  K 4.6  CL 102  CO2 26  GLUCOSE 128*  BUN 19  CREATININE 1.06  CALCIUM 9.1    Physical Exam: BP 108/64 (BP Location: Left Arm)   Pulse 94   Temp 98 F (36.7 C) (Oral)   Resp 15   Ht 6\' 1"  (1.854 m)   Wt 107.3 kg   SpO2 95%   BMI 31.21 kg/m  Constitutional:  No distress . Vital signs reviewed. HENT: Normocephalic.  Atraumatic. Eyes: EOMI. No discharge. Cardiovascular: No JVD. Respiratory: Normal effort.  No stridor. GI: Non-distended. Skin: + VAC to chest. Psych: Normal mood.  Normal behavior. Musc: Left hip and sternum TTP Neurologic: Alert Motor: Right upper extremity: 4+/5 proximal to distal Left upper extremity: 4-/5 proximal to distal (some pain inhibition) Left lower extremity: 4/5 proximal to distal Right lower extremity: 4-4+/5 proximal to distal (some pain inhibition)  Assessment/Plan: 1. Functional deficits secondary to right thalamic infarct with lumbar discitis which require 3+ hours per day of interdisciplinary therapy in a comprehensive inpatient rehab setting.  Physiatrist is providing close team supervision and 24 hour management of active medical problems listed below.  Physiatrist and rehab team continue to assess barriers to discharge/monitor patient progress toward functional and medical goals  Care Tool:  Bathing  Bathing activity did not occur: Safety/medical concerns(Unable to tolerate activity/mobility 2/2 pain) Body parts bathed by patient: Right arm, Left arm, Chest, Abdomen, Front perineal area, Right upper leg, Left upper leg, Left lower leg, Face   Body parts bathed by helper: Buttocks, Right lower leg     Bathing assist Assist Level: 2 Helpers(+2 required for sit>stand to wash buttock)     Upper Body Dressing/Undressing Upper body dressing   What is the patient wearing?: Hospital gown only    Upper body assist Assist Level: Minimal Assistance - Patient > 75%    Lower Body Dressing/Undressing Lower body dressing  What is the patient wearing?: Incontinence brief     Lower body assist Assist for lower body dressing: Maximal Assistance - Patient 25 - 49%     Toileting Toileting    Toileting assist Assist for toileting: Moderate Assistance - Patient 50 - 74% Assistive Device Comment:  urinal   Transfers Chair/bed transfer  Transfers assist     Chair/bed transfer assist level: Minimal Assistance - Patient > 75%     Locomotion Ambulation   Ambulation assist      Assist level: Minimal Assistance - Patient > 75% Assistive device: Walker-rolling Max distance: 75'   Walk 10 feet activity   Assist     Assist level: Minimal Assistance - Patient > 75% Assistive device: Walker-rolling   Walk 50 feet activity   Assist    Assist level: Minimal Assistance - Patient > 75% Assistive device: Walker-rolling    Walk 150 feet activity   Assist Walk 150 feet activity did not occur: Safety/medical concerns         Walk 10 feet on uneven surface  activity   Assist Walk 10 feet on uneven surfaces activity did not occur: Safety/medical concerns         Wheelchair     Assist Will patient use wheelchair at discharge?: (TBD) Type of Wheelchair: Manual    Wheelchair assist level: Supervision/Verbal cueing Max wheelchair distance: 37'    Wheelchair 50 feet with 2 turns activity    Assist        Assist Level: Supervision/Verbal cueing   Wheelchair 150 feet activity     Assist Wheelchair 150 feet activity did not occur: Safety/medical concerns        Medical Problem List and Plan: 1.  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 Antithrombotics: -DVT/anticoagulation:Pharmaceutical:Pradexa -antiplatelet therapy: N/a 3. Pain Management:  Flexeril changed to Robaxin 500 twice daily as needed  Added lidocaine patch to lumbar paraspinals.   Continue oxycodone prn   Added tramadol prn for moderate pain. 4. Mood:Team to provide ego support and encouragement.LCSW to follow for evaluation and support -antipsychotic agents: N/A 5.  Neuropsych: This patientiscapable of making decisions on hisown behalf. 6. Skin/Wound Care:See #9 7. Fluids/Electrolytes/Nutrition:Monitor I/Os. Continue prostat supplement due to low protein store and to promote healing. 8. MSSA bacteremia: Naficin every 4 thorough 9/16due to concerns of meningitic spreadthen changed to Cefazolin thorough 10/13 9. Diskitis/left sternoclavicular infection: Wound VACdressing changesMWFper WOC. 10. Left biceps tendinosis/Mild-moderate OA left shoulder: Follow up with ortho post discharge.  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 PAF with RVR: Monitor HR --continue Tikosyn and Pradexa.  Went into A. fib with RVR and spontaneously converted on 9/19, EKGs reviewed 13. T2DM with hyperglycemia: Hgb A1C- 6.7and controlled on metformin BID PTA. Will monitor BS ac/hs with SSI for elevated BS.  Metformin 500 daily started on 9/18  Remains elevated on 9/19  Monitor with increased mobility 14.Emphysema: Continue Breo. Currently needing supplemental oxygen prn per patient.  15. Hypokalemia/Hypomagnesemia: Continue K dur and Mag Ox  Potassium 4.6 on 9/17  Magnesium 1.8 on 9/17 16.  Hypoalbuminemia  Supplement initiated 17.  Nausea  Appears to be related to antibiotics, however labs ordered  LOS: 3 days A FACE TO FACE EVALUATION WAS PERFORMED  [name] [name] [place] 09/18/2019, 1:36 PM

## 2019-09-18 NOTE — Progress Notes (Signed)
Call to room by patient stating he is "in Atrial Fibrillation", upon assessing patient he denies chest pain, SOB present placed on 2 liters Q2, states he had nausea prior to calling for assistance but it is resolved , coloration normal,rapid response RN notified VS monitor and record, EKG obtained verifying Atrial Fib with RVR. Xanax .05 mg administered. Dr. Posey Pronto notified and on department( 4W).[name] Patient is being monitor by staff, denies chest pains.Rapid response RN on department,and assessed patient

## 2019-09-18 NOTE — Progress Notes (Signed)
Occupational Therapy Session Note  Patient Details  Name: [name] [name]. MRN: [id number] Date of Birth: 08/03/1951  Today's Date: 09/18/2019 OT Individual Time: 1300-1400 OT Individual Time Calculation (min): 60 min    Short Term Goals: Week 1:  OT Short Term Goal 1 (Week 1): Pt will complete functoinal transfers with min A overall using LRAD OT Short Term Goal 2 (Week 1): Pt will don pants with min A sit>stand using AE PRN OT Short Term Goal 3 (Week 1): Pt will stand to complete 1 grooming task with steadying assist in order to increase functional standing balance/endurance OT Short Term Goal 4 (Week 1): Pt will don shirt with supervision/set-up  Skilled Therapeutic Interventions/Progress Updates:    Treatment session with focus on sit > stand from progressively lower surface and BUE/BLE endurance.  Pt received supine in bed declining any bathing/dressing at this time but agreeable to therapy session.  Engaged in bed mobility to come to sitting at EOB with mod assist.  Focus on sit > stand from progressively lower surface with pt requiring mod assist +2 from elevated surface and from w/c and Nustep seat.  Pt with varied response in pain to sit > stand but was overall quite consistent with level of assist.  Short distance ambulation with RW with min assist once upright.  Utilized Nustep on resistance level 2 with pt tolerating 4 mins before reporting fatigue and discomfort in lower back.  Returned to room and applied k-pad to back in supine for pain relief.  Therapy Documentation Precautions:  Precautions Precautions: Fall Precaution Comments: wound vac L [place] joint  Required Braces or Orthoses: Spinal Brace Spinal Brace: Lumbar corset, Applied in sitting position Restrictions Weight Bearing Restrictions: No Other Position/Activity Restrictions: wound vac General:   Vital Signs: Therapy Vitals Temp: 97.9 F (36.6 C) Temp Source: Oral Pulse Rate: 80 Resp: 17 BP: (!)  105/54 Patient Position (if appropriate): Lying Oxygen Therapy SpO2: 94 % O2 Device: Room Air Pain:  Pt with c/o pain and muscle spasms in lower back.  RN provided muscle relaxer during session.   Therapy/Group: Individual Therapy  Simonne Come 09/18/2019, 3:13 PM

## 2019-09-18 NOTE — Significant Event (Signed)
Rapid Response Event Note  Overview: Time Called: 0717 Arrival Time: 0722 Event Type: Cardiac  Initial Focused Assessment: Per RN patient complained of nausea and that he was in "A fib" BP 119/82  HR 110  RR 22 O2 sat 97% on 2L [place]  Upon my arrival EKG done  AF 100s BP 107/76  HR 102  RR 20  O2 sat 98% on [place] Patient states nausea is improved Denies CP  RN notified Dr Posey Pronto, 0.5 xanax given   0800:  BP 110/68  HR 95  RR 20 O2 sat 94% on 2L [place]  Interventions: Patient's pradaxa and Tikosyn given as scheduled  Plan of Care (if not transferred): RN to call as assistance needed RN to call if HR greater than 120 or if patient complains of chest pain  Event Summary: Name of Physician Notified: Posey Pronto at 1720    at    Outcome: Stayed in room and stabalized     [place]enn [place], [place]

## 2019-09-18 NOTE — Progress Notes (Signed)
Patient with episode of nausea and discomfort to abdomen,mid and lowers quads,prn Zophran IV administered, refuse some of po medications once nausea had subsided. Monitor closely

## 2019-09-18 NOTE — Progress Notes (Signed)
      [address]       [place] [address]             [phone]         Subjective: Nausea, Diarrhea, and lack of appetite.   Objective: Vital signs in last 24 hours: Temp:  [97.6 F (36.4 C)-98.3 F (36.8 C)] 98 F (36.7 C) (09/19 0804) Pulse Rate:  [80-110] 94 (09/19 0827) Resp:  [15-22] 15 (09/19 0827) BP: (104-125)/(64-82) 108/64 (09/19 0827) SpO2:  [90 %-97 %] 95 % (09/19 0827) Weight:  [107.3 kg] 107.3 kg (09/19 0626)     Intake/Output from previous day: 09/18 0701 - 09/19 0700 In: 770 [P.O.:720; IV Piggyback:50] Out: 1900 [Urine:1800; Drains:100] Intake/Output this shift: No intake/output data recorded.  General appearance: alert, cooperative and no distress Heart: regular rate and rhythm, S1, S2 normal, no murmur, click, rub or gallop Lungs: clear to auscultation bilaterally Abdomen: soft, non-tender; bowel sounds normal; no masses,  no organomegaly Extremities: extremities normal, atraumatic, no cyanosis or edema Wound: good suction on wound vac  Lab Results: Recent Labs    09/16/19 0424  WBC 9.2  HGB 13.9  HCT 42.4  PLT 197   BMET:  Recent Labs    09/16/19 0424  NA 136  K 4.6  CL 102  CO2 26  GLUCOSE 128*  BUN 19  CREATININE 1.06  CALCIUM 9.1    PT/INR: No results for input(s): LABPROT, INR in the last 72 hours. ABG    Component Value Date/Time   TCO2 26 03/01/2017 0258   CBG (last 3)  Recent Labs    09/17/19 1640 09/17/19 2117 09/18/19 0634  GLUCAP 116* 135* 144*    Assessment/Plan:   -POD18incision and drainage of left [place] joint abscess. -Good suction on the wound vac -Diarrhea last night and the previous  Few days. Will discontinue laxatives. Nausea and lack of appetite. May need repeat labs.  -Patient believes Nausea is from the antibiotics since it started around the same time. Will leave to ID to change if needed.   WillcontinueM-W-F dressing changes.ABX for MSSA cultured from the woundper  ID recs.  -We will continue to follow.    LOS: 3 days    Elgie Collard 09/18/2019

## 2019-09-18 NOTE — Progress Notes (Signed)
RT filled patient's home CPAP with sterile water and bled 3L O2 into circuit. Patient stated he would place himself on CPAP when he is ready. RT informed patient to have RT called if assistance is required. RT will monitor as needed.

## 2019-09-19 ENCOUNTER — Inpatient Hospital Stay (HOSPITAL_COMMUNITY): Payer: Medicare Other | Admitting: Occupational Therapy

## 2019-09-19 ENCOUNTER — Inpatient Hospital Stay (HOSPITAL_COMMUNITY): Payer: Medicare Other

## 2019-09-19 DIAGNOSIS — I480 Paroxysmal atrial fibrillation: Secondary | ICD-10-CM

## 2019-09-19 DIAGNOSIS — G8918 Other acute postprocedural pain: Secondary | ICD-10-CM

## 2019-09-19 DIAGNOSIS — R0989 Other specified symptoms and signs involving the circulatory and respiratory systems: Secondary | ICD-10-CM

## 2019-09-19 DIAGNOSIS — R799 Abnormal finding of blood chemistry, unspecified: Secondary | ICD-10-CM

## 2019-09-19 LAB — GLUCOSE, CAPILLARY
Glucose-Capillary: 123 mg/dL — ABNORMAL HIGH (ref 70–99)
Glucose-Capillary: 142 mg/dL — ABNORMAL HIGH (ref 70–99)
Glucose-Capillary: 117 mg/dL — ABNORMAL HIGH (ref 70–99)
Glucose-Capillary: 150 mg/dL — ABNORMAL HIGH (ref 70–99)

## 2019-09-19 NOTE — Progress Notes (Signed)
  Physical Therapy Session Note  Patient Details  Name: [name] [name]. MRN: [id number] Date of Birth: 1951/11/01  Today's Date: 09/19/2019 PT Individual Time: 1500-1533 PT Individual Time Calculation (min): 33 min   Short Term Goals: Week 1:  PT Short Term Goal 1 (Week 1): Pt will complete sit to stand with assist x 1 consistently PT Short Term Goal 2 (Week 1): Pt will initiate stair training PT Short Term Goal 3 (Week 1): Pt will ambulate x 100 ft with LRAD and min A Week 2:    Week 3:     Skilled Therapeutic Interventions/Progress Updates:  Pain:  8/10 L hip/low back, pt states he stayed up in wc too long this am and attributes pain to this.  Treatment to tolerance.  Stated pain decreased following treatment.      Pt initially sidelying in bed. Pt explained he had 8/10 pain from sitting up in wc too long, but was agreeable to attempting rx.  Requested to use urinal or commode to urinate.     sidelying to sit on edge of bed w/mod assist.  Pt sat x 5 min w/cga and c/o above pain, gradually decreased w/sitting.  Aspen brace donned in sitting by therapist.  Sit to stand w/RW w/mod assist, stood x 2 min w/cga but unable to tolerate gait due to pain.  Rested in sitting x 5 min. Sit to stand w/RW w/min assist, stood x 2 mn and pt able to sidestep w/walker 4 steps towards head of bed w/cga. Stand to sit w/cga.   Aspen brace removed in sitting. Sit to supine w/mod assist for LE mgmt. Pt stated he would use urinal independently in bed.  Stated "moving seems to have helped my pain a bit".   Pt left supine w/rails up x 3, needs in reach, bed alarm set.  Therapy Documentation Precautions:  Precautions Precautions: Fall Precaution Comments: wound vac L [place] joint  Required Braces or Orthoses: Spinal Brace Spinal Brace: Lumbar corset, Applied in sitting position Restrictions Weight Bearing Restrictions: No Other Position/Activity Restrictions: wound vac       Therapy/Group:  Individual Therapy  Callie Fielding, PT  09/19/2019, 3:52 PM

## 2019-09-19 NOTE — Progress Notes (Signed)
[facility] PHYSICAL MEDICINE & REHABILITATION PROGRESS NOTE  Subjective/Complaints: Patient seen laying in bed this morning.  He slept well overnight.  He states he had a good bowel movement this morning.  He he points to in association with his nausea and pain medications.  ROS: Denies CP, SOB, N/V/D  Objective: Vital Signs: Blood pressure 130/61, pulse 75, temperature 98 F (36.7 C), temperature source Oral, resp. rate 15, height 6\' 1"  (1.854 m), weight 108.1 kg, SpO2 94 %. No results found. Recent Labs    09/18/19 1509  WBC 10.1  HGB 14.1  HCT 40.9  PLT 196   Recent Labs    09/18/19 1509  NA 135  K 4.1  CL 100  CO2 25  GLUCOSE 140*  BUN 23  CREATININE 1.13  CALCIUM 9.3    Physical Exam: BP 130/61 (BP Location: Left Arm)   Pulse 75   Temp 98 F (36.7 C) (Oral)   Resp 15   Ht 6\' 1"  (1.854 m)   Wt 108.1 kg   SpO2 94%   BMI 31.44 kg/m  Constitutional: No distress . Vital signs reviewed. HENT: Normocephalic.  Atraumatic. Eyes: EOMI. No discharge. Cardiovascular: No JVD. Respiratory: Normal effort.  No stridor. GI: Non-distended. Skin: +VAC to chest suctioning without leaks Psych: Normal mood.  Normal behavior. Musc: Left hip and sternum TTP  Neurologic: Alert Motor: Right upper extremity: 4+/5 proximal to distal Left upper extremity: 4-/5 proximal to distal (some pain inhibition) Left lower extremity: 4/5 proximal to distal, unchanged Right lower extremity: 4-4+/5 proximal to distal (some pain inhibition), unchanged  Assessment/Plan: 1. Functional deficits secondary to right thalamic infarct with lumbar discitis which require 3+ hours per day of interdisciplinary therapy in a comprehensive inpatient rehab setting.  Physiatrist is providing close team supervision and 24 hour management of active medical problems listed below.  Physiatrist and rehab team continue to assess barriers to discharge/monitor patient progress toward functional and medical  goals  Care Tool:  Bathing  Bathing activity did not occur: Safety/medical concerns(Unable to tolerate activity/mobility 2/2 pain) Body parts bathed by patient: Right arm, Left arm, Chest, Abdomen, Front perineal area, Right upper leg, Left upper leg, Face   Body parts bathed by helper: Buttocks, Right lower leg, Left lower leg     Bathing assist Assist Level: Minimal Assistance - Patient > 75%     Upper Body Dressing/Undressing Upper body dressing   What is the patient wearing?: Pull over shirt    Upper body assist Assist Level: Minimal Assistance - Patient > 75%    Lower Body Dressing/Undressing Lower body dressing      What is the patient wearing?: Pants     Lower body assist Assist for lower body dressing: Minimal Assistance - Patient > 75%     Toileting Toileting    Toileting assist Assist for toileting: Moderate Assistance - Patient 50 - 74% Assistive Device Comment: urinal   Transfers Chair/bed transfer  Transfers assist     Chair/bed transfer assist level: Minimal Assistance - Patient > 75%     Locomotion Ambulation   Ambulation assist      Assist level: Minimal Assistance - Patient > 75% Assistive device: Walker-rolling Max distance: 75'   Walk 10 feet activity   Assist     Assist level: Minimal Assistance - Patient > 75% Assistive device: Walker-rolling   Walk 50 feet activity   Assist    Assist level: Minimal Assistance - Patient > 75% Assistive device: Walker-rolling  Walk 150 feet activity   Assist Walk 150 feet activity did not occur: Safety/medical concerns         Walk 10 feet on uneven surface  activity   Assist Walk 10 feet on uneven surfaces activity did not occur: Safety/medical concerns         Wheelchair     Assist Will patient use wheelchair at discharge?: (TBD) Type of Wheelchair: Manual    Wheelchair assist level: Supervision/Verbal cueing Max wheelchair distance: 70'    Wheelchair 50  feet with 2 turns activity    Assist        Assist Level: Supervision/Verbal cueing   Wheelchair 150 feet activity     Assist Wheelchair 150 feet activity did not occur: Safety/medical concerns        Medical Problem List and Plan: 1.  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 Antithrombotics: -DVT/anticoagulation:Pharmaceutical:Pradexa -antiplatelet therapy: N/a 3. Pain Management:  Flexeril changed to Robaxin 500 twice daily as needed  Added lidocaine patch to lumbar paraspinals.   Continue oxycodone prn   Added tramadol prn for moderate pain.  Monitor for nausea associated with Oxy/tramadol 4. Mood:Team to provide ego support and encouragement.LCSW to follow for evaluation and support -antipsychotic agents: N/A 5. Neuropsych: This patientiscapable of making decisions on hisown behalf. 6. Skin/Wound Care:See #9 7. Fluids/Electrolytes/Nutrition:Monitor I/Os. Continue prostat supplement due to low protein store and to promote healing. 8. MSSA bacteremia: Naficin every 4 thorough 9/16due to concerns of meningitic spreadthen changed to Cefazolin thorough 10/13 9. Diskitis/left sternoclavicular infection: Wound VACdressing changesMWFper WOC. 10. Left biceps tendinosis/Mild-moderate OA left shoulder: Follow up with ortho post discharge.  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 PAF with RVR: Monitor HR --continue Tikosyn and Pradexa.  Went into A. fib with RVR and spontaneously converted on 9/19, EKGs reviewed 13. T2DM with hyperglycemia: Hgb A1C- 6.7and controlled on metformin BID PTA. Will monitor BS ac/hs with SSI for elevated BS.  Metformin 500 daily started on 9/18  Slightly elevated on 9/20, consider further medication adjustments if  persistent  Monitor with increased mobility 14.Emphysema: Continue Breo. Currently needing supplemental oxygen prn per patient.  15. Hypokalemia/Hypomagnesemia: Continue K dur and Mag Ox  Potassium 4.1 on 9/19  Magnesium 1.8 on 9/17 16.  Hypoalbuminemia  Supplement initiated 17.  Nausea  Appears to be related to pain medications  Improving 18.  Slightly labile blood pressure  Continue to monitor 19.  BUN/creatinine trending up  Encourage fluids  LOS: 4 days A FACE TO FACE EVALUATION WAS PERFORMED  Charizma Gardiner Lorie Phenix 09/19/2019, 12:04 PM

## 2019-09-19 NOTE — Progress Notes (Signed)
Occupational Therapy Session Note  Patient Details  Name: [name] [name]. MRN: [id number] Date of Birth: April 10, 1951  Today's Date: 09/19/2019 OT Individual Time: 0930-1040 OT Individual Time Calculation (min): 70 min    Short Term Goals: Week 1:  OT Short Term Goal 1 (Week 1): Pt will complete functoinal transfers with min A overall using LRAD OT Short Term Goal 2 (Week 1): Pt will don pants with min A sit>stand using AE PRN OT Short Term Goal 3 (Week 1): Pt will stand to complete 1 grooming task with steadying assist in order to increase functional standing balance/endurance OT Short Term Goal 4 (Week 1): Pt will don shirt with supervision/set-up  Skilled Therapeutic Interventions/Progress Updates:    Pt seen for OT ADL bathing/dressing session. Pt awake sitting up in bed upon arrival, agreeable to tx session. He voiced pain as "manageable" and reports being up to date on all pain meds.  He transferred to sitting EOB with min A using hospital bed functions, VCs for technique and increased time. Following rest break EOB, he completed sit>stand from highly elevated EOB with min A overall with increased time required when bringing UE from bed onto RW. Once standing, pt CGA for transition to w/c.  He completed grooming tasks from w/c level at sink with set-up. UB bathing completed with set-up, min A UB dressing. LB bathing completed with assist to obtain and maintain figure four sitting position, steadying assist when standing to complete buttock hygiene. Min A to don pants and supervision to don socks. Pt left seated in w/c at end of session, all needs in reach, encouraged to stay up at least one hour  Throughout session, pt required increased time and repositioning 2/2 pain, specifically back tightness and "catching".   Therapy Documentation Precautions:  Precautions Precautions: Fall Precaution Comments: wound vac L [place] joint  Required Braces or Orthoses: Spinal Brace Spinal Brace:  Lumbar corset, Applied in sitting position Restrictions Weight Bearing Restrictions: No Other Position/Activity Restrictions: wound vac   Therapy/Group: Individual Therapy  [name] L 09/19/2019, 6:36 AM

## 2019-09-20 ENCOUNTER — Inpatient Hospital Stay (HOSPITAL_COMMUNITY): Payer: Medicare Other | Admitting: Occupational Therapy

## 2019-09-20 ENCOUNTER — Ambulatory Visit: Payer: Medicare Other | Admitting: Physician Assistant

## 2019-09-20 ENCOUNTER — Inpatient Hospital Stay (HOSPITAL_COMMUNITY): Payer: Medicare Other | Admitting: Physical Therapy

## 2019-09-20 LAB — CBC WITH DIFFERENTIAL/PLATELET
Abs Immature Granulocytes: 0.08 10*3/uL — ABNORMAL HIGH (ref 0.00–0.07)
Basophils Absolute: 0 10*3/uL (ref 0.0–0.1)
Basophils Relative: 0 %
Eosinophils Absolute: 1.2 10*3/uL — ABNORMAL HIGH (ref 0.0–0.5)
Eosinophils Relative: 12 %
HCT: 41 % (ref 39.0–52.0)
Hemoglobin: 14.1 g/dL (ref 13.0–17.0)
Immature Granulocytes: 1 %
Lymphocytes Relative: 21 %
Lymphs Abs: 2 10*3/uL (ref 0.7–4.0)
MCH: 31.8 pg (ref 26.0–34.0)
MCHC: 34.4 g/dL (ref 30.0–36.0)
MCV: 92.6 fL (ref 80.0–100.0)
Monocytes Absolute: 0.9 10*3/uL (ref 0.1–1.0)
Monocytes Relative: 10 %
Neutro Abs: 5.3 10*3/uL (ref 1.7–7.7)
Neutrophils Relative %: 56 %
Platelets: 192 10*3/uL (ref 150–400)
RBC: 4.43 MIL/uL (ref 4.22–5.81)
RDW: 15.5 % (ref 11.5–15.5)
WBC: 9.5 10*3/uL (ref 4.0–10.5)
nRBC: 0 % (ref 0.0–0.2)

## 2019-09-20 LAB — BASIC METABOLIC PANEL
Anion gap: 8 (ref 5–15)
BUN: 23 mg/dL (ref 8–23)
CO2: 27 mmol/L (ref 22–32)
Calcium: 9.4 mg/dL (ref 8.9–10.3)
Chloride: 102 mmol/L (ref 98–111)
Creatinine, Ser: 1.17 mg/dL (ref 0.61–1.24)
GFR calc Af Amer: >60 mL/min (ref >60)
GFR calc non Af Amer: >60 mL/min (ref >60)
Glucose, Bld: 144 mg/dL — ABNORMAL HIGH (ref 70–99)
Potassium: 4.9 mmol/L (ref 3.5–5.1)
Sodium: 137 mmol/L (ref 135–145)

## 2019-09-20 LAB — GLUCOSE, CAPILLARY
Glucose-Capillary: 137 mg/dL — ABNORMAL HIGH (ref 70–99)
Glucose-Capillary: 137 mg/dL — ABNORMAL HIGH (ref 70–99)
Glucose-Capillary: 136 mg/dL — ABNORMAL HIGH (ref 70–99)
Glucose-Capillary: 117 mg/dL — ABNORMAL HIGH (ref 70–99)

## 2019-09-20 LAB — MAGNESIUM: Magnesium: 1.9 mg/dL (ref 1.7–2.4)

## 2019-09-20 LAB — C-REACTIVE PROTEIN: CRP: <0.8 mg/dL (ref <1.0)

## 2019-09-20 MED ORDER — HYDROMORPHONE HCL 2 MG PO TABS
2.0000 mg | ORAL_TABLET | ORAL | Status: DC | PRN
  Administered 2019-09-20 – 2019-10-02 (×8): 2 mg via ORAL
  Filled 2019-09-20 (×10): qty 1

## 2019-09-20 NOTE — Progress Notes (Addendum)
  Subjective: The wound vac was changed ~ 15 min before I saw [name] [name] this morning.  He reported no new issues or concerns.   Objective: Vital signs in last 24 hours: Temp:  [97.8 F (36.6 C)-97.9 F (36.6 C)] 97.9 F (36.6 C) (09/21 0441) Pulse Rate:  [79-90] 89 (09/21 0441) Resp:  [17-19] 18 (09/21 0441) BP: (113-115)/(64-71) 115/71 (09/21 0441) SpO2:  [91 %-94 %] 93 % (09/21 0441) Weight:  [108.5 kg] 108.5 kg (09/21 0441)    Intake/Output from previous day: 09/20 0701 - 09/21 0700 In: 32 [P.O.:780] Out: 1750 [Urine:1700; Drains:50] Intake/Output this shift: Total I/O In: 240 [P.O.:240] Out: -   General appearance: alert, cooperative and mild distress Wound: The wound vac is intact and functioning appropriately. The surrounding tissues appear healthy. Minimal drainage.  Wound care RN notes reviewed.   Lab Results: Recent Labs    09/18/19 1509 09/20/19 0508  WBC 10.1 9.5  HGB 14.1 14.1  HCT 40.9 41.0  PLT 196 192   BMET:  Recent Labs    09/18/19 1509 09/20/19 0508  NA 135 137  K 4.1 4.9  CL 100 102  CO2 25 27  GLUCOSE 140* 144*  BUN 23 23  CREATININE 1.13 1.17  CALCIUM 9.3 9.4    PT/INR: No results for input(s): LABPROT, INR in the last 72 hours. ABG    Component Value Date/Time   TCO2 26 03/01/2017 0258   CBG (last 3)  Recent Labs    09/19/19 1649 09/19/19 2105 09/20/19 0608  GLUCAP 117* 150* 137*    Assessment/Plan:  -POD20incision and drainage of left [place] joint abscess. The woundwasclean and contracting as noted by the wound care RN this AM.WillcontinueM-W-F dressing changes.ABX for MSSA cultured from the woundper ID recs.  Suspect we will be able to convert to saolne moist to dry dressings within a week.  -We will continue to follow.    LOS: 5 days    [name] Odea, PA-C [phone] 09/20/2019 I agree with the above assessment  and plan.  Grace Isaac MD Beeper [phone] Office [phone] 09/20/2019  3:34 PM

## 2019-09-20 NOTE — Progress Notes (Signed)
RT has home CPAP at bedside, O2 humidity provided and there is 3L O2 bled. Patient stated he would place himself on CPAP when he is ready for bed. Patient stated no assistance is needed

## 2019-09-20 NOTE — Progress Notes (Signed)
Occupational Therapy Session Note  Patient Details  Name: [name] [name]. MRN: [id number] Date of Birth: 03-29-1951  Today's Date: 09/20/2019 OT Individual Time: 1115-1200 and 1300-1400 OT Individual Time Calculation (min): 45 min and 60 min   Short Term Goals: Week 1:  OT Short Term Goal 1 (Week 1): Pt will complete functoinal transfers with min A overall using LRAD OT Short Term Goal 2 (Week 1): Pt will don pants with min A sit>stand using AE PRN OT Short Term Goal 3 (Week 1): Pt will stand to complete 1 grooming task with steadying assist in order to increase functional standing balance/endurance OT Short Term Goal 4 (Week 1): Pt will don shirt with supervision/set-up  Skilled Therapeutic Interventions/Progress Updates:    Session One: Pt seen for OT session focusing on functional mobility and pain management. Pt in supine upon arrival, agreeable to tx session. No pain at rest, however, throughout session, back "catching" at times requiring increased time and VCs for deep breathing technique. Heat pack provided for comfort at end of session, RN aware.  He transferred to sitting EOB with min A using hospital bed functions and increased time. Extended rest break required while EOB due to "tightness" in lower back. Massage provided at trigger point throughout session with pt reporting relief.  He completed sit>Stand from elevated EOB and from w/c throughout session with min A overall with increased time required and heavy reliance on UEs. Completed short distance ambulation throughout room and in hallway with CGA, however +2 required for management of wound vac and IV. Dynamic standing trials with CGA with emphasis on reducing UE reliance on RW. Pt returned to room at end of session, left seated in w/c with all needs in reach and heat pack applied to lower back for comfort.   Session Two: Pt seen for OT session focusing on ADL re-training. Pt sitting up in w/c upon arrival, voiced pain  manageable at 6/10 and agreeable to cont with therapy without intervention.  Completed bathing/dressing routine from w/c level at sink, set-up UB bathing, did not don new shirt this session, just washing around shirt 2/2 wound vac difficulties, RN made aware.  Pt stood at sink with mod A, with min steadying assist, while pt completed pericare and buttock hygiene. When pants within reach, pt able to pull up pants with steadying assist. Pt tolerating ~1 minute in standing before requiring seated rest breaks.  Following extended seated rest break, pt ambulated within hallway, ~108ft with CGA, VCs throughout to widen BOS and RW management. Pt returned to bed at end of session. Positioned in supine for comfort and ice pack applied to lower back, all needs in reach and bed alarm on.   Therapy Documentation Precautions:  Precautions Precautions: Fall Precaution Comments: wound vac L [place] joint  Required Braces or Orthoses: Spinal Brace Spinal Brace: Lumbar corset, Applied in sitting position Restrictions Weight Bearing Restrictions: No Other Position/Activity Restrictions: wound vac   Therapy/Group: Individual Therapy  Kylan Liberati L 09/20/2019, 7:04 AM

## 2019-09-20 NOTE — Progress Notes (Signed)
[name] [name] PHYSICAL MEDICINE & REHABILITATION PROGRESS NOTE  Subjective/Complaints:   Pt reports pain isn't controlled right now, because scared of pain meds and associated nausea.  "scared of Percocet" because scared of the nausea.   Sternal Wound VAC changed this AM by NP. Increased appetite with change in diet.   ROS: Denies CP, SOB, N/V/D  Objective: Vital Signs: Blood pressure 115/71, pulse 89, temperature 97.9 F (36.6 C), temperature source Oral, resp. rate 18, height 6\' 1"  (1.854 m), weight 108.5 kg, SpO2 93 %. No results found. Recent Labs    09/18/19 1509 09/20/19 0508  WBC 10.1 9.5  HGB 14.1 14.1  HCT 40.9 41.0  PLT 196 192   Recent Labs    09/18/19 1509 09/20/19 0508  NA 135 137  K 4.1 4.9  CL 100 102  CO2 25 27  GLUCOSE 140* 144*  BUN 23 23  CREATININE 1.13 1.17  CALCIUM 9.3 9.4    Physical Exam: BP 115/71 (BP Location: Left Arm)   Pulse 89   Temp 97.9 F (36.6 C) (Oral)   Resp 18   Ht 6\' 1"  (1.854 m)   Wt 108.5 kg   SpO2 93%   BMI 31.56 kg/m  Constitutional: No distress . Vital signs reviewed. Awake, alert, appropriate, sitting up in bed, NAD HENT: Normocephalic.  Atraumatic. Eyes: EOMI. No discharge. Cardiovascular: CTA B/L- wound VAC in L chest near clavicle Respiratory: CTA B/L GI: Non-distended. Skin: +VAC to chest suctioning without leaks Psych: Normal mood.  Normal behavior. Musc: Left hip and sternum TTP  Neurologic: Alert Motor: Right upper extremity: 4+/5 proximal to distal Left upper extremity: 4-/5 proximal to distal (some pain inhibition) Left lower extremity: 4/5 proximal to distal, unchanged Right lower extremity: 4-4+/5 proximal to distal (some pain inhibition), unchanged  Assessment/Plan: 1. Functional deficits secondary to right thalamic infarct with lumbar discitis which require 3+ hours per day of interdisciplinary therapy in a comprehensive inpatient rehab setting.  Physiatrist is providing close team  supervision and 24 hour management of active medical problems listed below.  Physiatrist and rehab team continue to assess barriers to discharge/monitor patient progress toward functional and medical goals  Care Tool:  Bathing  Bathing activity did not occur: Safety/medical concerns(Unable to tolerate activity/mobility 2/2 pain) Body parts bathed by patient: Right arm, Left arm, Chest, Abdomen, Front perineal area, Right upper leg, Left upper leg, Face   Body parts bathed by helper: Buttocks, Right lower leg, Left lower leg     Bathing assist Assist Level: Minimal Assistance - Patient > 75%     Upper Body Dressing/Undressing Upper body dressing   What is the patient wearing?: Pull over shirt    Upper body assist Assist Level: Minimal Assistance - Patient > 75%    Lower Body Dressing/Undressing Lower body dressing      What is the patient wearing?: Pants     Lower body assist Assist for lower body dressing: Independent     Toileting Toileting    Toileting assist Assist for toileting: Moderate Assistance - Patient 50 - 74% Assistive Device Comment: urinal   Transfers Chair/bed transfer  Transfers assist     Chair/bed transfer assist level: Minimal Assistance - Patient > 75%     Locomotion Ambulation   Ambulation assist      Assist level: Minimal Assistance - Patient > 75% Assistive device: Walker-rolling Max distance: 28'   Walk 10 feet activity   Assist     Assist level: Minimal Assistance - Patient >  75% Assistive device: Walker-rolling   Walk 50 feet activity   Assist    Assist level: Minimal Assistance - Patient > 75% Assistive device: Walker-rolling    Walk 150 feet activity   Assist Walk 150 feet activity did not occur: Safety/medical concerns         Walk 10 feet on uneven surface  activity   Assist Walk 10 feet on uneven surfaces activity did not occur: Safety/medical concerns         Wheelchair     Assist Will  patient use wheelchair at discharge?: (TBD) Type of Wheelchair: Manual    Wheelchair assist level: Supervision/Verbal cueing Max wheelchair distance: 33'    Wheelchair 50 feet with 2 turns activity    Assist        Assist Level: Supervision/Verbal cueing   Wheelchair 150 feet activity     Assist Wheelchair 150 feet activity did not occur: Safety/medical concerns        Medical Problem List and Plan: 1.  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 Antithrombotics: -DVT/anticoagulation:Pharmaceutical:Pradexa -antiplatelet therapy: N/a 3. Pain Management:  Flexeril changed to Robaxin 500 twice daily as needed  Added lidocaine patch to lumbar paraspinals.   Continue oxycodone prn   Added tramadol prn for moderate pain.  Monitor for nausea associated with Oxy/tramadol  9/21- will change Oxy to Dilaudid 2 mg q4 hours prn. 4. Mood:Team to provide ego support and encouragement.LCSW to follow for evaluation and support -antipsychotic agents: N/A 5. Neuropsych: This patientiscapable of making decisions on hisown behalf. 6. Skin/Wound Care:See #9 7. Fluids/Electrolytes/Nutrition:Monitor I/Os. Continue prostat supplement due to low protein store and to promote healing. 8. MSSA bacteremia: Naficin every 4 thorough 9/16due to concerns of meningitic spreadthen changed to Cefazolin thorough 10/13 9. Diskitis/left sternoclavicular infection: Wound VACdressing changesMWFper WOC. 10. Left biceps tendinosis/Mild-moderate OA left shoulder: Follow up with ortho post discharge.  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 PAF with RVR: Monitor HR --continue Tikosyn and Pradexa.  Went into A. fib with RVR and spontaneously converted on 9/19, EKGs reviewed 13. T2DM  with hyperglycemia: Hgb A1C- 6.7and controlled on metformin BID PTA. Will monitor BS ac/hs with SSI for elevated BS.  CBG (last 3)  Recent Labs    09/19/19 1649 09/19/19 2105 09/20/19 0608  GLUCAP 117* 150* 137*     Metformin 500 daily started on 9/18  Slightly elevated on 9/20, consider further medication adjustments if persistent  Monitor with increased mobility  9/21- good control- con't meds 14.Emphysema: Continue Breo. Currently needing supplemental oxygen prn per patient.  15. Hypokalemia/Hypomagnesemia: Continue K dur and Mag Ox  Potassium 4.1 on 9/19  Magnesium 1.8 on 9/17 16.  Hypoalbuminemia  Supplement initiated 17.  Nausea  Appears to be related to pain medications  Improving  9/21- changed oxy to dialudid 2mg  q4 hours prn 18.  Slightly labile blood pressure  Continue to monitor 19.  BUN/creatinine trending up  Encourage fluids  LOS: 5 days A FACE TO FACE EVALUATION WAS PERFORMED  [name] [name] 09/20/2019, 9:50 AM

## 2019-09-20 NOTE — Progress Notes (Signed)
Physical Therapy Session Note  Patient Details  Name: [name] [name]. MRN: [id number] Date of Birth: 1951-04-16  Today's Date: 09/20/2019 PT Individual Time: 0915-1015; 1500-1530 PT Individual Time Calculation (min): 60 min and 30 min  Short Term Goals: Week 1:  PT Short Term Goal 1 (Week 1): Pt will complete sit to stand with assist x 1 consistently PT Short Term Goal 2 (Week 1): Pt will initiate stair training PT Short Term Goal 3 (Week 1): Pt will ambulate x 100 ft with LRAD and min A  Skilled Therapeutic Interventions/Progress Updates:    Session 1: Pt received semi-reclined in bed, agreeable to PT session. Pt reports GERD pain, RN able to provide pt with medication during session. Pt also reports 7/10 L low back pain at rest that increases to 9/10 with mobility, declines intervention as he is premedicated with pain medication. Rolling L/R with min A and use of bedrails for dependent pericare following some bowel incontinence. Pt is min A to don shorts in bed. Supine to sit with min A with HOB elevated. Dependent to don LSO while seated EOB. Sit to stand with min A to RW from elevated bed. Stand pivot transfer bed to w/c with min A. Sit to stand x 2 reps from w/c to RW with mod A. Ambulation x 75', x 95' with RW and min A, flexed trunk posture and B shoulder elevation. Pt relies heavily on BUE support on RW in standing and with gait. Pt requests to return to bed at end of session. Sit to supine mod A for BLE management. Pt left semi-reclined in bed with needs in reach at end of session.  Session 2: Pt received seated in bed, agreeable to PT session. Pt reports 7/10 pain in low back and has been using ice pack for pain relief after previous session. Supine to sit with mod A and HOB elevated, increased time needed due to pain. Sit to stand with min A to RW. Pt only tolerates standing x 10 sec due to pain, unable to move RW or take steps to head of bed. Sit to supine mod A for BLE management.  Pt is min A to scoot up towards [facility] with use of bed features. Rolling to the R for placement of kpad for pain management. Pt left semi-reclined in bed with needs in reach at end of session. Pt's tolerance for therapy and OOB mobility remains limited by ongoing pain.   Therapy Documentation Precautions:  Precautions Precautions: Fall Precaution Comments: wound vac L Belk joint  Required Braces or Orthoses: Spinal Brace Spinal Brace: Lumbar corset, Applied in sitting position Restrictions Weight Bearing Restrictions: No Other Position/Activity Restrictions: wound vac    Therapy/Group: Individual Therapy   Excell Seltzer, PT, DPT  09/20/2019, 12:39 PM

## 2019-09-20 NOTE — Consult Note (Signed)
[place] Nurse wound follow up Patient receiving care in [facility] 4M03. Wound type: Left [place] joint, surgical Measurement: deferred Wound bed: 100% red granulation tissue Drainage (amount, consistency, odor) sanginous drainage, no odor Periwound: intact Dressing procedure/placement/frequency: one piece of black foam removed, one piece placed. Immediate seal obtained. Patient tolerated very well. Val Riles, RN, MSN, CWOCN, CNS-BC, pager [phone]

## 2019-09-21 ENCOUNTER — Inpatient Hospital Stay (HOSPITAL_COMMUNITY): Payer: Medicare Other | Admitting: Occupational Therapy

## 2019-09-21 ENCOUNTER — Encounter (HOSPITAL_COMMUNITY): Payer: Medicare Other | Admitting: Psychology

## 2019-09-21 ENCOUNTER — Inpatient Hospital Stay (HOSPITAL_COMMUNITY): Payer: Medicare Other

## 2019-09-21 ENCOUNTER — Inpatient Hospital Stay (HOSPITAL_COMMUNITY): Payer: Medicare Other | Admitting: Physical Therapy

## 2019-09-21 LAB — GLUCOSE, CAPILLARY
Glucose-Capillary: 145 mg/dL — ABNORMAL HIGH (ref 70–99)
Glucose-Capillary: 134 mg/dL — ABNORMAL HIGH (ref 70–99)
Glucose-Capillary: 148 mg/dL — ABNORMAL HIGH (ref 70–99)
Glucose-Capillary: 143 mg/dL — ABNORMAL HIGH (ref 70–99)

## 2019-09-21 MED ORDER — PAROXETINE HCL 20 MG PO TABS
20.0000 mg | ORAL_TABLET | Freq: Every day | ORAL | Status: DC
  Administered 2019-09-28 – 2019-10-01 (×4): 20 mg via ORAL
  Filled 2019-09-21 (×5): qty 1

## 2019-09-21 MED ORDER — PAROXETINE HCL 10 MG PO TABS
10.0000 mg | ORAL_TABLET | Freq: Every day | ORAL | Status: AC
  Administered 2019-09-21 – 2019-09-27 (×7): 10 mg via ORAL
  Filled 2019-09-21 (×7): qty 1

## 2019-09-21 MED ORDER — PAROXETINE HCL 10 MG PO TABS: 10.0000 mg | ORAL_TABLET | Freq: Every day | ORAL | Status: DC

## 2019-09-21 NOTE — Plan of Care (Signed)
  Problem: Consults Goal: RH GENERAL PATIENT EDUCATION Description: See Patient Education module for education specifics. Outcome: Progressing Goal: Skin Care Protocol Initiated - if Braden Score 18 or less Description: If consults are not indicated, leave blank or document N/A Outcome: Progressing Goal: Nutrition Consult-if indicated Outcome: Progressing Goal: Diabetes Guidelines if Diabetic/Glucose > 140 Description: If diabetic or lab glucose is > 140 mg/dl - Initiate Diabetes/Hyperglycemia Guidelines & Document Interventions  Outcome: Progressing   Problem: RH SKIN INTEGRITY Goal: RH STG SKIN FREE OF INFECTION/BREAKDOWN Description: Patients skin will remain free from further infection or breakdown with min assist. Outcome: Progressing Goal: RH STG MAINTAIN SKIN INTEGRITY WITH ASSISTANCE Description: STG Maintain Skin Integrity With min Assistance. Outcome: Progressing Goal: RH STG ABLE TO PERFORM INCISION/WOUND CARE W/ASSISTANCE Description: STG Able To Perform Incision/Wound Care With total Assistance from caregiver. Outcome: Progressing   Problem: RH PAIN MANAGEMENT Goal: RH STG PAIN MANAGED AT OR BELOW PT'S PAIN GOAL Description: < 4 Outcome: Progressing

## 2019-09-21 NOTE — Consult Note (Signed)
Neuropsychological Consultation   Patient:   [name] [name].   DOB:   [birth year]  MR Number:  [id number]  Location:  [place] [address] CENTER [address] [place] [id number] [place] [address] Dept: [place]: [phone]           Date of Service:   09/21/2019  Start Time:   1 PM End Time:   2 PM  Provider/Observer:  Ilean Skill, Psy.D.       Clinical Neuropsychologist       Billing Code/Service: [id number]  Chief Complaint:    Nikki Dom. [place] is a 68 year old male who has a history of CAD, CAF, type 2 diabetes, obstructive sleep apnea with CPAP, prior lumbar decompression, recent diagnosis of thoracic aneurysm, NAION right eye with recent admission on 08/04/2023 [facility] for left optic nerve inflammation.  The patient was treated with IV steroids overnight and discharged home.  The patient developed right lower extremity cellulitis post discharge resolved with doxycycline but started developing progressive back pain with poor p.o. intake, inability to walk as well as bilateral shoulder pain.  The patient was admitted for work-up on 08/27/2019 and found to be dehydrated and had an infection.  The patient has had significant chest wall infection.  Neurology consulted for input with MRI of brain as well as spine to rule out infection versus other causes.  MRI of brain showed punctuate infarct in right thalamus and in splenium corpus callosum question micro embolic from posterior circulation.  Thoracic and lumbar spine showed moderate to severe lumbar stenosis with bone marrow signal changes etc.  The patient developed fevers and blood culture positive for MSSA.  The patient had significant swelling and showed signs of septic left [place] joint and early osteomyelitis involving distal clavicle and significant soft tissue involvement.  The patient was taken to the OR on 09/01/2019 for I&D and wound VAC application.  The patient was eventually  discharged and and admitted to the comprehensive inpatient rehabilitation service.  Reason for Service:  [id number] H Stophel is a 68 year old male with history of CAD,CAF- pradaxa,T2DM, OSA- on CPAP,prior lumbar decompression, recent diagnosis of thoracic aneurysm- 4.1 cm,NAION right eye with recent admission 8/5-08/05/19 to [facility] for left optic nerve inflammation which was found on routine follow up. He wastreated with IV steroidsovernightand dischargedto homeon taper. He developed RLE cellulitis post dischargewhich resolved withdoxycylinebut he started developing progressive back pain with poor po intake, inability to walk as well as bilateral shoulder pain (bilateral clavicle to chest wall and up his neck with inability to move BUE. He was admitted for work up on 08/27/19 and found to be dehydrated with with BUN 36, had leucocytosis with WBC 12.0 and elevated CRP-1.3. Neurology consulted for input and recommended MRI brain as well as spine to rule out infection v/s NMO/MS v/s PMR v/s siff person syndrome. CTA head neck was negative for evidence of CSF vasculitis and showed 50% stenosis left SA origin and 30-50% stenosis R-VA V4 segment. MRI brain showed punctate infarct in right thalamus and in splenium corpus callosum question micro embolic from posterior circulation. MRI cervical. Thoracic and lumbar spine showed moderate to sever lumbar foraminal stenosis with bone marrow signal changes in L4/5 with mild paravertebral inflammatory changes and cervical spondylosis with canal narrowing C4/5. He developed fevers and blood cultures done positive for MSSA. MRI sternum ordered due to swelling and showed septic left  joint with early osteomyelitis involving distal clavicle and  significant soft tissue stranding with edema and left pectoralis muscular edema.   Dr. Servando Snare consulted and patient taken to OR 09/01/19 for I &D left  joint with application of wound VAC. Dr. Leonel Ramsay felt that  diffuse weakness was improving and most likely felt to be due to pain from diskitis/osteomyelitis but recommended meningitic dosage for coverage of MSSA bacteremia due to possibility for spread to meningitis. ID (Dr.Comer) following for input and recommended Nafcillin X 14 days with change to cefazolin 9/17-->10/12/19. 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 Reactive leucocytosis and thrombocytopenia is resolving. Hypokalemia and hypomagnesemia has resolved with supplementation.   Current Status:  The patient has a prior history of significant medical issuance including an epiglottis cyst.  Initially, the [place] the beach when he developed symptoms that he thought was related to a shellfish/shrimp allergic reaction.  The patient developed difficulty breathing and was taken to the emergency room where they ended up intubating him conscious and he was intubated for 4 days having to sit up and be still.  The patient reports that this caused a lot of psychological stress and continues to have claustrophobic type symptoms another stress responses from this experience that he perceived as life-threatening and in fact was life-threatening due to losing his airway.  It was later determined to be a cyst that was removed by ENT and he has not had medical issues from it going forward but the psychological impact from this continues.  The patient reports that all of the medical issues that he has been dealing with recently have really highlighted some of his anxiety and stress.  The patient reports that he has had anxiety prior to this current medical issue that he dates back to his experience several years ago with his epiglottis cyst.  However, the patient is reporting some significant increases and emotional responses and crying spells.  He reports that these are very different  than in the past and he has been very emotional recently.  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  Behavioral Observation: Shaunte Erdman.  presents as a 68 y.o.-year-old Right Caucasian Male who appeared his stated age. his dress was Appropriate and he was Well Groomed and his manners were Appropriate to the situation.  his participation was indicative of Appropriate and Redirectable behaviors.  There were any physical disabilities noted.  he displayed an appropriate level of cooperation and motivation.     Interactions:    Active Appropriate and Redirectable  Attention:   abnormal and the patient tended to be distracted by internal preoccupations and some degree of anxiety.  Memory:   within normal limits; recent and remote memory intact  Visuo-spatial:  not examined  Speech (Volume):  normal  Speech:   normal; normal  Thought Process:  Coherent and Relevant  Though Content:  WNL; not suicidal and not homicidal  Orientation:   person, place, time/date and situation  Judgment:   Good  Planning:   Good  Affect:    Anxious  Mood:    Anxious  Insight:   Good  Marital Status/Living: The patient is married and his wife has a number of medical issues are self that the patient has been the caretaker for.  He is somewhat anxious about her  wellbeing as well although she has a son that has been helping out and getting groceries for her.    Medical History:   Past Medical History:  Diagnosis Date  . Atrial fibrillation ([place]) 03/22/2009   a. s/p multiple DCCV; b. no coumadin due to low TE risk profile; c. Tikosyn Rx  . Coronary atherosclerosis of native coronary artery 11/2002   a. s/p stent to LAD 12/03; OM2 occluded at cath 12/03; d. myoview 5/10: no ischemia;  e. echo 7/11: EF 55%, BAE, mild RVE, PASP 41-45; Myoview was in March 2013. There was no ischemia or infarction,  EF 51%   . Cutaneous abscess of back excluding buttocks 07/04/2014   Appears to stem from possibly a cyst very large area 6 cm contact surgeon office   . Diabetes mellitus without complication ([place])   . Drusen body    see opth note  . ERECTILE DYSFUNCTION 03/22/2009  . GERD 03/22/2009  . HYPERGLYCEMIA 04/25/2010  . HYPERLIPIDEMIA 03/22/2009  . 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 LATERAL EPICONDYLITIS, LEFT 10/24/2009  . LIVER FUNCTION TESTS, ABNORMAL 04/25/2010  . Local reaction to immunization 05/05/2012   minor resolving  zostavax   . Myocardial infarction ([place]) mi2003  . Numbness in left leg    foot related to back disease and surgery  . Obesity, unspecified 04/24/2009  . Perforated appendicitis with necrosis s/p open appendectomy 06/07/14 06/04/2014  . Renal cyst    Characterized by MRI as simple  . Ruptured suppurative appendicitis    2015   . SLEEP APNEA, OBSTRUCTIVE 03/22/2009   compliant with CPAP  . THROMBOCYTOPENIA 08/16/2010  . TOBACCO USE, QUIT 10/24/2009  . 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 Umbilical hernia     Psychiatric History:  While the patient denies significant past psychiatric history he does have a history of anxiety that dates back to a traumatic experience several years ago where he was intubated while fully alert and awake and remained intubated for 4 days and has residual chronic PTSD type symptoms and claustrophobia that have been heightened by his current medical status.  Family Med/Psych History:  Family History  Problem Relation Age of Onset  . Thyroid disease Mother   . Ovarian cancer Mother   . Breast cancer Mother   . Lung cancer Father   . Cancer Father    Impression/DX:  Kumar Mesler. [place] is a 68 year old male who has a history of CAD, CAF, type 2 diabetes, obstructive sleep apnea with CPAP, prior lumbar decompression, recent diagnosis of thoracic aneurysm, NAION right eye with recent admission on 08/04/2023 [facility] for  left optic nerve inflammation.  The patient was treated with IV steroids overnight and discharged home.  The patient developed right lower extremity cellulitis post discharge resolved with doxycycline but started developing progressive back pain with poor p.o. intake, inability to walk as well as bilateral shoulder pain.  The patient was admitted for work-up on 08/27/2019 and found to be dehydrated and had an infection.  The patient has had significant chest wall infection.  Neurology consulted for input with MRI of brain as well as spine to rule out infection versus other causes.  MRI of brain showed punctuate infarct in right thalamus and in splenium corpus callosum question micro embolic from posterior circulation.  Thoracic and lumbar spine showed moderate to severe lumbar stenosis with bone marrow signal changes etc.  The patient developed fevers and blood culture positive for MSSA.  The patient had significant swelling and showed signs of septic left  joint and early osteomyelitis involving distal clavicle and significant soft tissue involvement.  The patient was taken to the OR on 09/01/2019 for I&D and wound VAC application.  The patient was eventually discharged and and admitted to the comprehensive inpatient rehabilitation service.  The patient has a prior history of significant medical issuance including an epiglottis cyst.  Initially, the patient was at the beach when he developed symptoms that he thought was related to a shellfish/shrimp allergic reaction.  The patient developed difficulty breathing and was taken to the emergency room where they ended up intubating him conscious and he was intubated for 4 days having to sit up and be still.  The patient reports that this caused a lot of psychological stress and continues to have claustrophobic type symptoms another stress responses from this experience that he perceived as life-threatening and in fact was life-threatening due to losing his airway.  It  was later determined to be a cyst that was removed by ENT and he has not had medical issues from it going forward but the psychological impact from this continues.  The patient reports that all of the medical issues that he has been dealing with recently have really highlighted some of his anxiety and stress.  The patient reports that he has had anxiety prior to this current medical issue that he dates back to his experience several years ago with his epiglottis cyst.  However, the patient is reporting some significant increases and emotional responses and crying spells.  He reports that these are very different than in the past and he has been very emotional recently.  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  Disposition/Plan:  I will follow-up with the patient next week to further assess his emotional status and get a better reading on whether or not there is symptoms consistent with pseudobulbar affect.         Electronically Signed   _______________________ Ilean Skill, Psy.D.

## 2019-09-21 NOTE — Progress Notes (Signed)
Occupational Therapy Session Note  Patient Details  Name: [name] [name]. MRN: [id number] Date of Birth: [birth year]  Today's Date: 09/21/2019 OT Individual Time: [id number] OT Individual Time Calculation (min): 70 min    Short Term Goals: Week 1:  OT Short Term Goal 1 (Week 1): Pt will complete functoinal transfers with min A overall using LRAD OT Short Term Goal 2 (Week 1): Pt will don pants with min A sit>stand using AE PRN OT Short Term Goal 3 (Week 1): Pt will stand to complete 1 grooming task with steadying assist in order to increase functional standing balance/endurance OT Short Term Goal 4 (Week 1): Pt will don shirt with supervision/set-up  Skilled Therapeutic Interventions/Progress Updates:    Pt seen for OT session focusing on functional mobility and activty tolerance. Pt sitting up in bed upon arrival, agreeable to tx session. Reports having had heat on lower back for 30 minutes prior to tx with some relief noted. RN also adminstered scheduled muscle relaxxer at start of session.  He transferred to sitting EOB with min A using hospital bed features and VCs for technique. Increased time required for repositioning for comfort throughout. Pt unable to toelrate sitting EOB to take AM meds for RN, required B UE support to relieve presssure on back. Completed min A stand pivot transfer to w/c with VCs for RW management.  He self propelled w/c to ADL apartment. Discussed home bedroom set-up and side of bed pt sleeps on. Attempted to get into bed, lying down on L side, however, unable to go down onto L side 2/2 lower back pain. Trialed on R, pt tolerated well. Provided with leg lifter and following VCs for technique and demonsntration, pt able to transition EOB>supine with guarding assist. HE returned to sitting EOB with min A and VCs for technique. Pt reports decrease in back pain/ muscle spasm when transitioning to bed from R vs. L. Discussed home set-up and changing bed sides at home for  increased ease. Following seated rest break, he ambulated from ADL apartment back to room with CGA. Returned to sitting EOB where he completed toileting task using [place] urinal with set-up. Transitioned back to supine with supervision using leg lifter. Pt left in supine at end of session, all needs in reach.  Education/discussion throughout session regarding DME, activity progression, reducing caregiver burden and d/c planning.   Therapy Documentation Precautions:  Precautions Precautions: Fall Precaution Comments: wound vac L [place] joint  Required Braces or Orthoses: Spinal Brace Spinal Brace: Lumbar corset, Applied in sitting position Restrictions Weight Bearing Restrictions: No Other Position/Activity Restrictions: wound vac   Therapy/Group: Individual Therapy  Zhane Donlan L 09/21/2019, 6:54 AM

## 2019-09-21 NOTE — Progress Notes (Signed)
[place] PHYSICAL MEDICINE & REHABILITATION PROGRESS NOTE  Subjective/Complaints:   Pt reports pain/nausea much better in last 1 day- appreciated change to Dilaudid although mainly uses the Tramadol-  Admits to periods of anxiety- had a near death experience a few years ago and gets anxious when feels like closed in or can't catch a good breath- reports taking a deep breath "a 100x/day" to make sure can catch his breath. Also admits wife is disabled so concerned about how will make it when goes home.    ROS: Denies CP, SOB, N/V/D  Objective: Vital Signs: Blood pressure 120/74, pulse 69, temperature 97.6 F (36.4 C), temperature source Oral, resp. rate 17, height 6\' 1"  (1.854 m), weight 108 kg, SpO2 95 %. No results found. Recent Labs    09/18/19 1509 09/20/19 0508  WBC 10.1 9.5  HGB 14.1 14.1  HCT 40.9 41.0  PLT 196 192   Recent Labs    09/18/19 1509 09/20/19 0508  NA 135 137  K 4.1 4.9  CL 100 102  CO2 25 27  GLUCOSE 140* 144*  BUN 23 23  CREATININE 1.13 1.17  CALCIUM 9.3 9.4    Physical Exam: BP 120/74 (BP Location: Left Arm)   Pulse 69   Temp 97.6 F (36.4 C) (Oral)   Resp 17   Ht 6\' 1"  (1.854 m)   Wt 108 kg   SpO2 95%   BMI 31.41 kg/m  Constitutional: No distress . Vital signs reviewed. Awake, alert, appropriate, sitting up in bed, got anxious x2- and was near tears; could tell he was near a pnaic x1 and couldn't cathc his breath talking about the anxiety, NAD HENT: Normocephalic.  Atraumatic. Eyes: EOMI. No discharge. Cardiovascular: CTA B/L- wound VAC in L chest near clavicle Respiratory: CTA B/L GI: Non-distended. Skin: +VAC to chest suctioning without leaks Psych: got anxious x2- as above Musc: Left hip and sternum TTP  Neurologic: Alert Motor: Right upper extremity: 4+/5 proximal to distal Left upper extremity: 4-/5 proximal to distal (some pain inhibition) Left lower extremity: 4/5 proximal to distal, unchanged Right lower extremity:  4-4+/5 proximal to distal (some pain inhibition), unchanged  Assessment/Plan: 1. Functional deficits secondary to right thalamic infarct with lumbar discitis which require 3+ hours per day of interdisciplinary therapy in a comprehensive inpatient rehab setting.  Physiatrist is providing close team supervision and 24 hour management of active medical problems listed below.  Physiatrist and rehab team continue to assess barriers to discharge/monitor patient progress toward functional and medical goals  Care Tool:  Bathing  Bathing activity did not occur: Safety/medical concerns(Unable to tolerate activity/mobility 2/2 pain) Body parts bathed by patient: Right arm, Left arm, Chest, Abdomen, Front perineal area, Right upper leg, Left upper leg, Face   Body parts bathed by helper: Buttocks, Right lower leg, Left lower leg     Bathing assist Assist Level: Minimal Assistance - Patient > 75%     Upper Body Dressing/Undressing Upper body dressing   What is the patient wearing?: Pull over shirt    Upper body assist Assist Level: Minimal Assistance - Patient > 75%    Lower Body Dressing/Undressing Lower body dressing      What is the patient wearing?: Pants     Lower body assist Assist for lower body dressing: Independent     Toileting Toileting    Toileting assist Assist for toileting: Moderate Assistance - Patient 50 - 74% Assistive Device Comment: urinal   Transfers Chair/bed transfer  Transfers assist  Chair/bed transfer assist level: Minimal Assistance - Patient > 75%     Locomotion Ambulation   Ambulation assist      Assist level: Minimal Assistance - Patient > 75% Assistive device: Walker-rolling Max distance: 95'   Walk 10 feet activity   Assist     Assist level: Minimal Assistance - Patient > 75% Assistive device: Walker-rolling   Walk 50 feet activity   Assist    Assist level: Minimal Assistance - Patient > 75% Assistive device:  Walker-rolling    Walk 150 feet activity   Assist Walk 150 feet activity did not occur: Safety/medical concerns         Walk 10 feet on uneven surface  activity   Assist Walk 10 feet on uneven surfaces activity did not occur: Safety/medical concerns         Wheelchair     Assist Will patient use wheelchair at discharge?: (TBD) Type of Wheelchair: Manual    Wheelchair assist level: Supervision/Verbal cueing Max wheelchair distance: 1'    Wheelchair 50 feet with 2 turns activity    Assist        Assist Level: Supervision/Verbal cueing   Wheelchair 150 feet activity     Assist Wheelchair 150 feet activity did not occur: Safety/medical concerns        Medical Problem List and Plan: 1.  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 Antithrombotics: -DVT/anticoagulation:Pharmaceutical:Pradexa -antiplatelet therapy: N/a 3. Pain Management:  Flexeril changed to Robaxin 500 twice daily as needed  Added lidocaine patch to lumbar paraspinals.   Continue oxycodone prn   Added tramadol prn for moderate pain.  Monitor for nausea associated with Oxy/tramadol  9/21- will change Oxy to Dilaudid 2 mg q4 hours prn.  9/22- helping 4. Mood:Team to provide ego support and encouragement.LCSW to follow for evaluation and support -antipsychotic agents: N/A 5. Neuropsych: This patientiscapable of making decisions on hisown behalf. 6. Skin/Wound Care:See #9 7. Fluids/Electrolytes/Nutrition:Monitor I/Os. Continue prostat supplement due to low protein store and to promote healing. 8. MSSA bacteremia: Naficin every 4 thorough 9/16due to concerns of meningitic spreadthen changed to Cefazolin thorough 10/13 9. Diskitis/left sternoclavicular infection: Wound VACdressing  changesMWFper WOC. 10. Left biceps tendinosis/Mild-moderate OA left shoulder: Follow up with ortho post discharge.  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 PAF with RVR: Monitor HR --continue Tikosyn and Pradexa.  Went into A. fib with RVR and spontaneously converted on 9/19, EKGs reviewed 13. T2DM with hyperglycemia: Hgb A1C- 6.7and controlled on metformin BID PTA. Will monitor BS ac/hs with SSI for elevated BS.  CBG (last 3)  Recent Labs    09/20/19 1642 09/20/19 2111 09/21/19 0622  GLUCAP 136* 117* 145*     Metformin 500 daily started on 9/18  Slightly elevated on 9/20, consider further medication adjustments if persistent  Monitor with increased mobility  9/21- good control- con't meds 14.Emphysema: Continue Breo. Currently needing supplemental oxygen prn per patient.  15. Hypokalemia/Hypomagnesemia: Continue K dur and Mag Ox  Potassium 4.1 on 9/19  Magnesium 1.8 on 9/17 16.  Hypoalbuminemia  Supplement initiated 17.  Nausea  Appears to be related to pain medications  Improving  9/21- changed oxy to dialudid 2mg  q4 hours prn 18.  Slightly labile blood pressure  Continue to monitor 19.  BUN/creatinine trending up  Encourage fluids 20. Anxiety/panic attacks  9/22-  has Benzo however would like to prevent these occurences will add Paxil 10 mg x 1 week QHS then increase to 20 mg QHS- since can cause sedation, will do at night. Has tolerated in past for depression. TO see Neuropsych as well today.  LOS: 6 days A FACE TO FACE EVALUATION WAS PERFORMED  Siedah Sedor 09/21/2019, 9:54 AM

## 2019-09-21 NOTE — Progress Notes (Signed)
Nutrition Follow-up  DOCUMENTATION CODES:   Obesity unspecified, Non-severe (moderate) malnutrition in context of acute illness/injury  INTERVENTION:   - Continue MVI with minerals daily  - Continue Pro-stat 30 ml BID, each supplement provides 100 kcal and 15 grams of protein  - Continue Magic cup TID with meals, each supplement provides 290 kcal and 9 grams of protein  - Double protein portions TID with meals  NUTRITION DIAGNOSIS:   Moderate Malnutrition related to acute illness (septic left [place] joint s/p I&D, osteomyelitis) as evidenced by mild muscle depletion, percent weight loss (6.6% weight loss in less than 2 months).  Ongoing, being addressed via oral nutrition supplements  GOAL:   Patient will meet greater than or equal to 90% of their needs  Progressing  MONITOR:   PO intake, Supplement acceptance, Labs, Weight trends, Skin  REASON FOR ASSESSMENT:   Malnutrition Screening Tool    ASSESSMENT:   68 year old male with PMH of CAD, CAF, T2DM, OSA on CPAP, prior lumbar decompression, recent diagnosis of thoracic aneurysm, NAION right eye with recent admission 8/5-08/05/19 to [facility] for left optic nerve inflammation which was found on routine follow up. Pt developed RLE cellulitis post discharge which resolved with doxycyline but he started developing progressive back pain with poor PO intake, inability to walk as well as bilateral shoulder pain. Pt was admitted for work-up on 08/27/19 and found to be dehydrated, had leucocytosis and elevated CRP. Neurology consulted for input and recommended MRI brain as well as spine to rule out infection vs NMO/MS vs PMR vs siff person syndrome. CTA head neck was negative for evidence of CSF vasculitis and showed 50% stenosis left SA origin and 30-50% stenosis R-VA V4 segment. MRI brain showed punctate infarct in right thalamus and in splenium corpus callosum question micro embolic from posterior circulation. MRI cervical. Thoracic and lumbar  spine showed moderate to sever lumbar foraminal stenosis with bone marrow signal changes in L4/5 with mild paravertebral inflammatory changes and cervical spondylosis with canal narrowing C4/5. He developed fevers and blood cultures done positive for MSSA. MRI sternum ordered due to swelling and showed septic left [place] joint with early osteomyelitis involving distal clavicle and significant soft tissue stranding with edema and left pectoralis muscular edema. Pt taken to OR 09/01/19 for I&D left [place] joint with application of wound VAC. 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 Pt admitted to CIR on 9/16.  Noted pt has had issues with nausea over the last several days which now appear to be improved after change in pain medication regimen.  Weight stable since 9/19. Will continue to monitor trends.  PO intake has greatly improved since last RD visit with 100% recorded for pt's last 8 meals. Pt is accepting Pro-stat per MAR. Wound VAC remains in place. Will continue with current plan of care.  Meal Completion: 100% x last 8 recorded meals  Medications reviewed and include: Pro-stat 30 ml BID, SSI, magnesium oxide, Metformin, MVI with minerals, Protonix, K-dur 40 mEq BID, Senna, IV abx  Labs reviewed. CBG's: 117-145 x 24 hours  UOP: 1925 ml x 24 hours Wound VAC: 450 ml x 24 hours I/O's: -7.0 L since admit  Diet Order:   Diet Order            Diet Carb Modified Fluid consistency: Thin; Room service appropriate? Yes  Diet effective now  EDUCATION NEEDS:   Education needs have been addressed  Skin:  Skin Assessment: Skin Integrity Issues: Skin Integrity Issues: Wound Vac: sternum Other: MASD to sacrum  Last BM:  09/20/19  Height:   Ht Readings from Last 1 Encounters:  09/16/19 6\' 1"  (1.854 m)    Weight:   Wt Readings from Last 1 Encounters:   09/21/19 108 kg    Ideal Body Weight:  83.6 kg  BMI:  Body mass index is 31.41 kg/m.  Estimated Nutritional Needs:   Kcal:  [id number]  Protein:  125-140 grams  Fluid:  >/= 2.2 L    Gaynell Face, MS, RD, LDN Inpatient Clinical Dietitian Pager: [phone] Weekend/After Hours: [phone]

## 2019-09-21 NOTE — Progress Notes (Signed)
Physical Therapy Session Note  Patient Details  Name: [name] [name]. MRN: [id number] Date of Birth: 15-Feb-1951  Today's Date: 09/21/2019 PT Individual Time: 1105-1200 PT Individual Time Calculation (min): 55 min   Short Term Goals: Week 1:  PT Short Term Goal 1 (Week 1): Pt will complete sit to stand with assist x 1 consistently PT Short Term Goal 2 (Week 1): Pt will initiate stair training PT Short Term Goal 3 (Week 1): Pt will ambulate x 100 ft with LRAD and min A  Skilled Therapeutic Interventions/Progress Updates:    Pt supine in bed upon PT arrival, agreeable to therapy tx and reports pain 6/10 in low back. Pt transferred to sitting EOB with min assist for trunk elevation, cues for back precautions/log roll techniques. Donned back brace. Pt performed sit>stand from elevated bed with RW and min assist, ambulated x 25 ft to w/c with RW and min assist, cues for decreased reliance on UEs- reports increased back pain with seated rest for pain management. Pt propelled w/c the rest of the way to the gym x 120 ft with supervision using B UEs working on endurance and UE strength. Pt performed seated LAQ and marches. Pt ambulated x 85 ft working on gait and activity tolerance, cues for decreased reliance on UEs. Pt transported back to the room, seated in w/c at the sink pt brushed teeth, washed face, performed upper body bathing (assist to wash back), doffed/donned shirt with assist for line management. Pt left in w/c at end of session with needs in reach and chair alarm set.   Therapy Documentation Precautions:  Precautions Precautions: Fall Precaution Comments: wound vac L [place] joint  Required Braces or Orthoses: Spinal Brace Spinal Brace: Lumbar corset, Applied in sitting position Restrictions Weight Bearing Restrictions: No Other Position/Activity Restrictions: wound vac   Therapy/Group: Individual Therapy  Netta Corrigan, PT, DPT 09/21/2019, 11:15 AM

## 2019-09-21 NOTE — Progress Notes (Signed)
Physical Therapy Session Note  Patient Details  Name: [name] [name]. MRN: [id number] Date of Birth: 08/08/1951  Today's Date: 09/21/2019 PT Individual Time: 1415-1540 PT Individual Time Calculation (min): 85 min   Short Term Goals: Week 1:  PT Short Term Goal 1 (Week 1): Pt will complete sit to stand with assist x 1 consistently PT Short Term Goal 2 (Week 1): Pt will initiate stair training PT Short Term Goal 3 (Week 1): Pt will ambulate x 100 ft with LRAD and min A  Skilled Therapeutic Interventions/Progress Updates:    Pt received supine in bed, agreeable to PT session. Pt reports 7/10 pain at rest, increases to 8/10 with activity. RN able to provide pain medication at beginning of session. Supine to sit with min A. Sit to stand with min A with increased time needed due to pain. Stand pivot transfer bed to w/c with RW and min A. Manual w/c propulsion x 100 ft with use of BUE and Supervision. Stand pivot transfer w/c to/from [place] with RW and min A. [place] level 3 x 10 min with use of B UE/LE for global endurance training. Ascend/descend 2 x 6" stairs with 2 handrails and min A. Pt able to provide a photo of his steps at home, has 2 STE with R handrail and then a step-up into his house. Stand pivot transfer w/c to bed with min A. Sit to supine mod A for BLE management, pt attempts to use leg lifter but due to pain and fatigue ultimately requires assist for BLE management. Pt is setup A to void in urinal. Pt left supine in bed with needs in reach at end of session.  Therapy Documentation Precautions:  Precautions Precautions: Fall Precaution Comments: wound vac L [place] joint  Required Braces or Orthoses: Spinal Brace Spinal Brace: Lumbar corset, Applied in sitting position Restrictions Weight Bearing Restrictions: No Other Position/Activity Restrictions: wound vac    Therapy/Group: Individual Therapy   Excell Seltzer, PT, DPT  09/21/2019, 3:55 PM

## 2019-09-22 ENCOUNTER — Inpatient Hospital Stay (HOSPITAL_COMMUNITY): Payer: Medicare Other

## 2019-09-22 ENCOUNTER — Inpatient Hospital Stay (HOSPITAL_COMMUNITY): Payer: Medicare Other | Admitting: Physical Therapy

## 2019-09-22 ENCOUNTER — Inpatient Hospital Stay (HOSPITAL_COMMUNITY): Payer: Medicare Other | Admitting: Occupational Therapy

## 2019-09-22 LAB — GLUCOSE, CAPILLARY
Glucose-Capillary: 153 mg/dL — ABNORMAL HIGH (ref 70–99)
Glucose-Capillary: 149 mg/dL — ABNORMAL HIGH (ref 70–99)
Glucose-Capillary: 113 mg/dL — ABNORMAL HIGH (ref 70–99)
Glucose-Capillary: 144 mg/dL — ABNORMAL HIGH (ref 70–99)

## 2019-09-22 NOTE — Progress Notes (Addendum)
[facility] PHYSICAL MEDICINE & REHABILITATION PROGRESS NOTE  Subjective/Complaints:   Pt reports wife is disabled due to fibromyalgia/pain and back disc herniation- can't lift- reports only took Dilaudid and tramadol x1 yesterday- no nausea. Asking if can get any aide/help at home.  ROS: Denies CP, SOB, N/V/D  Objective: Vital Signs: Blood pressure 121/60, pulse 66, temperature 98 F (36.7 C), resp. rate 17, height 6\' 1"  (1.854 m), weight 112.7 kg, SpO2 98 %. No results found. Recent Labs    09/20/19 0508  WBC 9.5  HGB 14.1  HCT 41.0  PLT 192   Recent Labs    09/20/19 0508  NA 137  K 4.9  CL 102  CO2 27  GLUCOSE 144*  BUN 23  CREATININE 1.17  CALCIUM 9.4    Physical Exam: BP 121/60 (BP Location: Left Arm)   Pulse 66   Temp 98 F (36.7 C)   Resp 17   Ht 6\' 1"  (1.854 m)   Wt 112.7 kg   SpO2 98%   BMI 32.78 kg/m  Constitutional: No distress . Vital signs reviewed. Awake, alert, appropriate, lying in bed; supine; no anxiety seen today; NAD HENT: Normocephalic.  Atraumatic. Eyes: EOMI. No discharge. Cardiovascular: CTA B/L- wound VAC in L chest near clavicle Respiratory: CTA B/L GI: Non-distended. Skin: +VAC to chest suctioning without leaks- just changed; PICC in RUE Psych: flat affect Musc: Left hip and sternum TTP  Neurologic: Alert Motor: Right upper extremity: 4+/5 proximal to distal Left upper extremity: 4-/5 proximal to distal (some pain inhibition) Left lower extremity: 4/5 proximal to distal, unchanged Right lower extremity: 4-4+/5 proximal to distal (some pain inhibition), unchanged  Assessment/Plan: 1. Functional deficits secondary to right thalamic infarct with lumbar discitis which require 3+ hours per day of interdisciplinary therapy in a comprehensive inpatient rehab setting.  Physiatrist is providing close team supervision and 24 hour management of active medical problems listed below.  Physiatrist and rehab team continue to assess  barriers to discharge/monitor patient progress toward functional and medical goals  Care Tool:  Bathing  Bathing activity did not occur: Safety/medical concerns(Unable to tolerate activity/mobility 2/2 pain) Body parts bathed by patient: Right arm, Left arm, Chest, Abdomen, Front perineal area, Right upper leg, Left upper leg, Face   Body parts bathed by helper: Buttocks, Right lower leg, Left lower leg     Bathing assist Assist Level: Minimal Assistance - Patient > 75%     Upper Body Dressing/Undressing Upper body dressing   What is the patient wearing?: Pull over shirt    Upper body assist Assist Level: Minimal Assistance - Patient > 75%    Lower Body Dressing/Undressing Lower body dressing      What is the patient wearing?: Pants     Lower body assist Assist for lower body dressing: Independent     Toileting Toileting    Toileting assist Assist for toileting: Moderate Assistance - Patient 50 - 74% Assistive Device Comment: urinal   Transfers Chair/bed transfer  Transfers assist     Chair/bed transfer assist level: Minimal Assistance - Patient > 75%     Locomotion Ambulation   Ambulation assist      Assist level: Minimal Assistance - Patient > 75% Assistive device: Walker-rolling Max distance: 85 ft   Walk 10 feet activity   Assist     Assist level: Minimal Assistance - Patient > 75% Assistive device: Walker-rolling   Walk 50 feet activity   Assist    Assist level: Minimal Assistance -  Patient > 75% Assistive device: Walker-rolling    Walk 150 feet activity   Assist Walk 150 feet activity did not occur: Safety/medical concerns         Walk 10 feet on uneven surface  activity   Assist Walk 10 feet on uneven surfaces activity did not occur: Safety/medical concerns         Wheelchair     Assist Will patient use wheelchair at discharge?: (TBD) Type of Wheelchair: Manual    Wheelchair assist level: Supervision/Verbal  cueing Max wheelchair distance: 100'    Wheelchair 50 feet with 2 turns activity    Assist        Assist Level: Supervision/Verbal cueing   Wheelchair 150 feet activity     Assist Wheelchair 150 feet activity did not occur: Safety/medical concerns        Medical Problem List and Plan: 1.  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 Antithrombotics: -DVT/anticoagulation:Pharmaceutical:Pradexa -antiplatelet therapy: N/a 3. Pain Management:  Flexeril changed to Robaxin 500 twice daily as needed  Added lidocaine patch to lumbar paraspinals.   Continue oxycodone prn   Added tramadol prn for moderate pain.  Monitor for nausea associated with Oxy/tramadol  9/21- will change Oxy to Dilaudid 2 mg q4 hours prn.  9/22- helping  9/23- taking 1-2x/day 4. Mood:Team to provide ego support and encouragement.LCSW to follow for evaluation and support -antipsychotic agents: N/A 5. Neuropsych: This patientiscapable of making decisions on hisown behalf. 6. Skin/Wound Care:See #9 7. Fluids/Electrolytes/Nutrition:Monitor I/Os. Continue prostat supplement due to low protein store and to promote healing. 8. MSSA bacteremia: Naficin every 4 thorough 9/16due to concerns of meningitic spreadthen changed to Cefazolin thorough 10/13 9. Diskitis/left sternoclavicular infection: Wound VACdressing changesMWFper WOC. 10. Left biceps tendinosis/Mild-moderate OA left shoulder: Follow up with ortho post discharge.  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 PAF with RVR: Monitor HR --continue Tikosyn and Pradexa.  Went into A. fib with RVR and spontaneously converted on 9/19, EKGs reviewed 13. T2DM with hyperglycemia: Hgb A1C- 6.7and controlled on metformin BID PTA. Will monitor  BS ac/hs with SSI for elevated BS.  CBG (last 3)  Recent Labs    09/21/19 1652 09/21/19 2118 09/22/19 0619  GLUCAP 148* 143* 153*     Metformin 500 daily started on 9/18  Slightly elevated on 9/20, consider further medication adjustments if persistent  Monitor with increased mobility  9/21- good control- con't meds 14.Emphysema: Continue Breo. Currently needing supplemental oxygen prn per patient.  15. Hypokalemia/Hypomagnesemia: Continue K dur and Mag Ox  Potassium 4.1 on 9/19  Magnesium 1.8 on 9/17 16.  Hypoalbuminemia  Supplement initiated 17.  Nausea  Appears to be related to pain medications  Improving  9/21- changed oxy to dialudid 2mg  q4 hours prn 18.  Slightly labile blood pressure  Continue to monitor 19.  BUN/creatinine trending up  Encourage fluids 20. Anxiety/panic attacks  9/22- has Benzo however would like to prevent these occurences will add Paxil 10 mg x 1 week QHS then increase to 20 mg QHS- since can cause sedation, will do at night. Has tolerated in past for depression. To see Neuropsych as well today.  21. Dispo- 9/23- will ask if can get H/H aide for pt  LOS: 7 days A FACE TO FACE EVALUATION WAS PERFORMED  Brittan Butterbaugh 09/22/2019, 9:00 AM

## 2019-09-22 NOTE — Progress Notes (Signed)
  Subjective: Mr. Kauk seemed more encouraged with his progress today. He was able to work with PT  /OT for 3 sessions yesterday. No new complaints or concerns. He was seen this morning during wound vac change.   Objective: Vital signs in last 24 hours: Temp:  [97.8 F (36.6 C)-98.3 F (36.8 C)] 98 F (36.7 C) (09/23 0350) Pulse Rate:  [66-90] 66 (09/23 0350) Resp:  [17-18] 17 (09/23 0350) BP: (105-121)/(60-66) 121/60 (09/23 0350) SpO2:  [94 %-98 %] 98 % (09/23 0350) Weight:  [112.7 kg] 112.7 kg (09/23 0350)     Intake/Output from previous day: 09/22 0701 - 09/23 0700 In: 1050 [P.O.:1040; I.V.:10] Out: 1985 [Urine:1975; Drains:10] Intake/Output this shift: Total I/O In: 200 [P.O.:200] Out: -   General appearance: alert, cooperative and mild distress Wound: The wound vac sponge was removed intact. There was some old clot in the floor of the wound that was evacuated with a cotton applicator. Minimal eschar and no drainage present. There is persistent exposure of a bony prominence at the center of the wound with minimal granulation at this point. Remaining tissues are clean and granulating.  The surrounding skin appears healthy.   Lab Results: Recent Labs    09/20/19 0508  WBC 9.5  HGB 14.1  HCT 41.0  PLT 192   BMET:  Recent Labs    09/20/19 0508  NA 137  K 4.9  CL 102  CO2 27  GLUCOSE 144*  BUN 23  CREATININE 1.17  CALCIUM 9.4    PT/INR: No results for input(s): LABPROT, INR in the last 72 hours. ABG    Component Value Date/Time   TCO2 26 03/01/2017 0258   CBG (last 3)  Recent Labs    09/21/19 1652 09/21/19 2118 09/22/19 0619  GLUCAP 148* 143* 153*    Assessment/Plan: S/P   -POD22incision and drainage of left [place] joint abscess. The woundwasclean and slowly granulatingWillcontinueM-W-F dressing changes.ABX forMSSA cultured from the woundper ID recs.  After examining the wound today, I am not as optimistic that we could safely convert  from vac therapy to dressing changes  this week.I suspect he will require several more days to achieve adequate tissue covering over the bone at the base of the wound.  -We will continue to follow.   LOS: 7 days    [name], [place] [phone] 09/22/2019

## 2019-09-22 NOTE — Progress Notes (Signed)
Physical Therapy Weekly Progress Note  Patient Details  Name: [name] [name]. MRN: [id number] Date of Birth: 1951/11/02  Beginning of progress report period: September 16, 2019 End of progress report period: September 22, 2019  Today's Date: 09/22/2019 PT Individual Time: 1500-1540 PT Individual Time Calculation (min): 40 min   Patient has met 3 of 3 short term goals.  Pt is at Supervision for rolling in bed, CGA for supine to sit, and Supervision to mod A depending on pain and fatigue level with sit to supine. Pt is CGA to min A for sit to stand and stand pivot transfers with RW. Pt can ambulate up to 100 ft with RW and CGA to min A. Pt is able to navigate 2 x 6" steps with 2 handrails and min A. Pt is making good progress towards therapy goals despite being limited at times by pain and decreased endurance.  Patient continues to demonstrate the following deficits muscle weakness, decreased cardiorespiratoy endurance and decreased standing balance, decreased postural control and decreased balance strategies and therefore will continue to benefit from skilled PT intervention to increase functional independence with mobility.  Patient progressing toward long term goals..  Continue plan of care.  PT Short Term Goals Week 1:  PT Short Term Goal 1 (Week 1): Pt will complete sit to stand with assist x 1 consistently PT Short Term Goal 1 - Progress (Week 1): Met PT Short Term Goal 2 (Week 1): Pt will initiate stair training PT Short Term Goal 2 - Progress (Week 1): Met PT Short Term Goal 3 (Week 1): Pt will ambulate x 100 ft with LRAD and min A PT Short Term Goal 3 - Progress (Week 1): Met Week 2:  PT Short Term Goal 1 (Week 2): =LTG due to ELOS  Skilled Therapeutic Interventions/Progress Updates:  Pt received semi-reclined in bed, agreeable to PT session. No complaints of pain at rest but does have onset of L low back pain with mobility. Pt reports pain is controlled with medication and  kpad. Supine to sit with CGA with increased time. Dependent to don LSO while seated EOB, setup A to don shoes. Sit to stand to RW from elevated bed with CGA. Stand pivot transfer bed to w/c with CGA and RW. Sit to stand with min A from w/c to RW. Stand pivot transfer back to bed with RW and CGA. Pt declines any ambulation this session due to pain and fatigue. Sit to supine with mod A for BLE management. Attempt to have pt use leg lifter for BLE management, unable to fully lift LE due to fatigue. Pt left supine in bed with needs in reach at end of session.  Therapy Documentation Precautions:  Precautions Precautions: Fall Precaution Comments: wound vac L [place] joint  Required Braces or Orthoses: Spinal Brace Spinal Brace: Lumbar corset, Applied in sitting position Restrictions Weight Bearing Restrictions: No Other Position/Activity Restrictions: wound vac   Therapy/Group: Individual Therapy   Excell Seltzer, PT, DPT  09/22/2019, 4:06 PM

## 2019-09-22 NOTE — Progress Notes (Signed)
Rt offered assistance to pt with his home CPAP machine. Pt requested RT fill humidity reservoir w/distilled water and clean mask for him. RT cleaned mask and filled reservoir. Pt respiratory status is stable at this time. RT will continue to monitor.

## 2019-09-22 NOTE — Progress Notes (Signed)
Occupational Therapy Weekly Progress Note  Patient Details  Name: [name] [name]. MRN: [id number] Date of Birth: October 14, 1951  Beginning of progress report period: September 16, 2019 End of progress report period: September 22, 2019  Today's Date: 09/22/2019 OT Individual Time: 6767-2094 OT Individual Time Calculation (min): 70 min    Patient has met 4 of 4 short term goals.  Pt is making excellent progress towards OT goals. He is completing functional transfers at overall min A level. However, he does cont to have significant pain, specifically muscle related pain in lower back. As a result, he  Requires significantly increased time and rest breaks to complete all functional mobility and ADLs. He is unable to tolerate unsupported sitting 2/2 back pain and core weakness and relies heavily on UEs on RW during standing tasks.  Patient continues to demonstrate the following deficits: muscle weakness, decreased cardiorespiratoy endurance and decreased sitting balance, decreased standing balance, decreased postural control and decreased balance strategies and therefore will continue to benefit from skilled OT intervention to enhance overall performance with BADL and Reduce care partner burden.  Patient progressing toward long term goals..  Continue plan of care.  OT Short Term Goals Week 1:  OT Short Term Goal 1 (Week 1): Pt will complete functoinal transfers with min A overall using LRAD OT Short Term Goal 1 - Progress (Week 1): Met OT Short Term Goal 2 (Week 1): Pt will don pants with min A sit>stand using AE PRN OT Short Term Goal 2 - Progress (Week 1): Met OT Short Term Goal 3 (Week 1): Pt will stand to complete 1 grooming task with steadying assist in order to increase functional standing balance/endurance OT Short Term Goal 3 - Progress (Week 1): Met OT Short Term Goal 4 (Week 1): Pt will don shirt with supervision/set-up OT Short Term Goal 4 - Progress (Week 1): Met Week 2:  OT Short  Term Goal 1 (Week 2): STG=LTG Due to LOS  Skilled Therapeutic Interventions/Progress Updates:    Pt seen for OT session focusing on education/d/c planning and functional transfers. Pt sitting up in w/c upon arrival, agreeable to tx session. Pt voiced beginning to feel uncomfortable in chair, otherwise doing well and ready for tx session. Extensive discussion regarding d/c planning and OT/PT goals. Pt with limited assistance at d/c as his wife is disabled and able to offer very little support. Discussed his need to be able to complete IADLs as well. Recommend use of w/c within home in order to complete IADLs as pt with limited standing tolerance and heavy UE reliance during standing, therefore, would be safer from w/c level. Pt voiced understanding and agreement. Discussed other needed DME as well. In ADL apartment, education/demonstration provided for use of tub transfer bench. Following demonstration, pt able to return demonstrate with assist to manage L LE over tub wall. CGA to stand from tub bench.  Following extended seated rest break, he ambulated ~70f before requiring seated rest break then another ~352fto return to room.  Pt returned to bed at end of session, with increased time able to lift B LEs onto bed. Pt left in supine at end of session, all needs in reach and bed alarm on.   Therapy Documentation Precautions:  Precautions Precautions: Fall Precaution Comments: wound vac L [place] joint  Required Braces or Orthoses: Spinal Brace Spinal Brace: Lumbar corset, Applied in sitting position Restrictions Weight Bearing Restrictions: No Other Position/Activity Restrictions: wound vac   Therapy/Group: Individual Therapy  Naftula Donahue  L 09/22/2019, 7:06 AM

## 2019-09-22 NOTE — Consult Note (Signed)
[place] Nurse wound follow up Patient receiving care in [facility] 4M03.  Surgical PA present at time of dressing change. Wound type: Left [place] joint surgical wound Measurement: deferred Wound bed: red granulation tissue Drainage (amount, consistency, odor) serosanginous Periwound: intact Dressing procedure/placement/frequency: one piece of black foam removed, one piece placed.  Immediate seal obtained. Val Riles, RN, MSN, CWOCN, CNS-BC, pager [phone]

## 2019-09-22 NOTE — Progress Notes (Addendum)
Physical Therapy Session Note  Patient Details  Name: [name] [name]. MRN: [id number] Date of Birth: Jul 29, 1951  Today's Date: 09/22/2019 PT Individual Time: 0903-1005 PT Individual Time Calculation (min): 62 min   Short Term Goals: Week 1:  PT Short Term Goal 1 (Week 1): Pt will complete sit to stand with assist x 1 consistently PT Short Term Goal 2 (Week 1): Pt will initiate stair training PT Short Term Goal 3 (Week 1): Pt will ambulate x 100 ft with LRAD and min A     Skilled Therapeutic Interventions/Progress Updates:  Pt resting in bed.  Pt used urinal independently in supine.  Therapeutic exercise performed with LE to increase strength for functional mobility: in supine- 5 x 1 pelvic tilts, in sitting, 3 x 1 R/L long arc quad knee extensions followed by hip flexion to place R/L feet on/off foot plates of w/c; 15 x 1 bil heel raises.  He donned shorts with supervision in supine.  Rolling R and sitting up with bed features and supervision.  Once sitting up, bed noted to be soiled.  Sit> supine with mod assist.  PT doffed short shorts and performed peri care with pt in R side lying. Pt donned clean shorts again in supine, rolling L><R to pull over hips.  PT donned LSO and shoes with pt sitting EOB. Pt dizzy upon sitting up; resolved in a couple of minutes.  Sit> stand with mod assist to RW.  Pivot to w/c to L with min assist. Pillow placed in lumbar area per pt request.  W/c propulsion in room using bil hands, to sink, for washing hands and brushing teeth independently.  W/c propulsion in hallway x 200' with superivison, using bil LEs.  Pt reported it was easier to propel with feet than hands.  At end of session, pt seated in wc with needs at hand.       Therapy Documentation Precautions:  Precautions Precautions: Fall Precaution Comments: wound vac L [place] joint  Required Braces or Orthoses: Spinal Brace Spinal Brace: Lumbar corset, Applied in sitting  position Restrictions Weight Bearing Restrictions: No Other Position/Activity Restrictions: wound vac       Therapy/Group: Individual Therapy  Eryka Dolinger 09/22/2019, 10:12 AM

## 2019-09-22 NOTE — Progress Notes (Signed)
Occupational Therapy Session Note  Patient Details  Name: [name] [name]. MRN: [id number] Date of Birth: [birth year]  Today's Date: 09/22/2019 OT Individual Time: 1330-1415 OT Individual Time Calculation (min): 45 min    Short Term Goals: Week 1:  OT Short Term Goal 1 (Week 1): Pt will complete functoinal transfers with min A overall using LRAD OT Short Term Goal 1 - Progress (Week 1): Met OT Short Term Goal 2 (Week 1): Pt will don pants with min A sit>stand using AE PRN OT Short Term Goal 2 - Progress (Week 1): Met OT Short Term Goal 3 (Week 1): Pt will stand to complete 1 grooming task with steadying assist in order to increase functional standing balance/endurance OT Short Term Goal 3 - Progress (Week 1): Met OT Short Term Goal 4 (Week 1): Pt will don shirt with supervision/set-up OT Short Term Goal 4 - Progress (Week 1): Met Week 2:  OT Short Term Goal 1 (Week 2): STG=LTG Due to LOS  Skilled Therapeutic Interventions/Progress Updates:    1:1 Pt in bed when arrived. Pt required more than reasonable amt of time to come to EOB with min A ( for the last little bit to come to full upright sitting. Pt perform sit to stand from slightly elevated bed with RW to transfer into w/c with min A. In w/c perform shoulder/ scapular mobilization, AROM with doorframe as a guide against gravity, holding ranges against gravity for 20 counts with promotion of upright sitting posture without back support. Pt required additional support at times when in unsupported sitting.  Also perform mobilization of trigger points to assist with pain in right trap. Pt left sitting up in the w/c.   Therapy Documentation Precautions:  Precautions Precautions: Fall Precaution Comments: wound vac L [place] joint  Required Braces or Orthoses: Spinal Brace Spinal Brace: Lumbar corset, Applied in sitting position Restrictions Weight Bearing Restrictions: No Other Position/Activity Restrictions: wound vac Pain: Mild back  pain - able to reposition or rest for break and then able to continue  Therapy/Group: Individual Therapy  Willeen Cass [facility] 09/22/2019, 2:22 PM

## 2019-09-23 ENCOUNTER — Inpatient Hospital Stay (HOSPITAL_COMMUNITY): Payer: Medicare Other | Admitting: Physical Therapy

## 2019-09-23 ENCOUNTER — Inpatient Hospital Stay (HOSPITAL_COMMUNITY): Payer: Medicare Other | Admitting: Occupational Therapy

## 2019-09-23 ENCOUNTER — Inpatient Hospital Stay (HOSPITAL_COMMUNITY): Payer: Medicare Other | Admitting: *Deleted

## 2019-09-23 LAB — GLUCOSE, CAPILLARY
Glucose-Capillary: 161 mg/dL — ABNORMAL HIGH (ref 70–99)
Glucose-Capillary: 136 mg/dL — ABNORMAL HIGH (ref 70–99)
Glucose-Capillary: 111 mg/dL — ABNORMAL HIGH (ref 70–99)
Glucose-Capillary: 116 mg/dL — ABNORMAL HIGH (ref 70–99)

## 2019-09-23 MED ORDER — POTASSIUM CHLORIDE CRYS ER 20 MEQ PO TBCR
20.0000 meq | EXTENDED_RELEASE_TABLET | Freq: Two times a day (BID) | ORAL | Status: DC
  Administered 2019-09-24 – 2019-10-01 (×16): 20 meq via ORAL
  Filled 2019-09-23 (×16): qty 1

## 2019-09-23 MED ORDER — LIDOCAINE 5 % EX PTCH
2.0000 | MEDICATED_PATCH | CUTANEOUS | Status: DC
  Administered 2019-09-23 – 2019-10-01 (×5): 2 via TRANSDERMAL
  Filled 2019-09-23 (×11): qty 2

## 2019-09-23 MED ORDER — METHOCARBAMOL 750 MG PO TABS: 750.0000 mg | ORAL_TABLET | Freq: Four times a day (QID) | ORAL | Status: DC | PRN

## 2019-09-23 MED ORDER — METHOCARBAMOL 500 MG PO TABS
500.0000 mg | ORAL_TABLET | Freq: Four times a day (QID) | ORAL | Status: DC | PRN
  Administered 2019-09-23 – 2019-10-02 (×15): 500 mg via ORAL
  Filled 2019-09-23 (×16): qty 1

## 2019-09-23 NOTE — Progress Notes (Signed)
Social Work Patient ID: [name] [name]., male   DOB: 05/12/1951, 68 y.o.   MRN: [id number]   CSW met with pt on 09-22-19 and then spoke with pt's wife via telephone today to update them on team conference discussion and pt's targeted d/c date of 10-02-19.  They were both pleased with this and wife stated she and probably her dtr-in-law would come for family education, especially for the IV antibiotics.  [name] [name] with [facility] is following pt and will coordinate training with wife.  CSW will continue to follow and prepare pt for d/c next week.

## 2019-09-23 NOTE — Progress Notes (Signed)
Occupational Therapy Session Note  Patient Details  Name: [name] [name]. MRN: [id number] Date of Birth: May 24, 1951  Today's Date: 09/23/2019 OT Individual Time: [id number] and 1415-1430 OT Individual Time Calculation (min): 55 min and 30 min   Short Term Goals: Week 2:  OT Short Term Goal 1 (Week 2): STG=LTG Due to LOS  Skilled Therapeutic Interventions/Progress Updates:    Session One: PT seen for OT ADL bathing/dressing session. Pt awake in supine upon arrival, easily awoken and agreeable to tx session. No complaints of pain at rest. Muscle pasms throughout session with mobility, reuqiring increased time to reposition for comfort and VCs for deep breathing techniques. Pt up to date on all meds.  He transferred to sitting EOB with increased time and supervision using hospital bed functions. Requires B UE support for sitting balance EOB to relieve pressure from back. Required min A to stand to from elevated EOB. Ambulated into bathroom with CGA and assist for management of wound vac and IV. Assist required for clothing management.  He ambulated out of bathroom and returned to w/c. Completed bathing/dressing routine from w/c level at sink. Stood with steadying assist at sink while pt completed pericare and buttock hygiene. He was able to cross B LEs into figure four position in order to wash LEs, thread on pants and don socks with supervision and increased time.  Pt left seated in w/c at end of session at sink finishing bathing task with hand off to next OT.   Session Two: Pt seen for OT session focusing on functional mobility. Pt in supine upon arrival, agreeable to tx session. Pt with increase in lower back pain/discomfort with mobility, requiring increased time and rest breaks throughout, repositioned for comfort throughout. Up to date on pain meds.  Completed toileting task in supine using HH urinal mod I. Transitioned to sitting EOB with supervision and increased time using hospital bed  functions. Increased time to adjust to comfort in sitting EOB. Light massage applied to trigger point in L lower back.  Ambulated within room with CGA dn increased time with assist to manage wound vac and IV. Assist for controlled descent into w/c.  Pt left seated in w/c at end of session, positioned with pillows for comfort and all needs in reach, RN present.   Therapy Documentation Precautions:  Precautions Precautions: Fall Precaution Comments: wound vac L [place] joint  Required Braces or Orthoses: Spinal Brace Spinal Brace: Lumbar corset, Applied in sitting position Restrictions Weight Bearing Restrictions: No Other Position/Activity Restrictions: wound vac   Therapy/Group: Individual Therapy  Jarett Dralle L 09/23/2019, 7:00 AM

## 2019-09-23 NOTE — Patient Care Conference (Signed)
Inpatient RehabilitationTeam Conference and Plan of Care Update Date: 09/21/2019   Time: 9:45 AM    Patient Name: [name] [name].      Medical Record Number: [id number]  Date of Birth: 1951-03-16 Sex: Male         Room/Bed: 4M03C/4M03C-01 Payor Info: Payor: Marine scientist / Plan: [facility] MEDICARE / Product Type: *No Product type* /    Admit Date/Time:  09/15/2019  3:10 PM  Primary Diagnosis:  Right thalamic infarction [facility])  Patient Active Problem List   Diagnosis Date Noted  . Labile blood pressure   . Elevated BUN   . Postoperative pain   . Atrial fibrillation with rapid ventricular response ([place])   . Nausea without vomiting   . Controlled type 2 diabetes mellitus with hyperglycemia, without long-term current use of insulin ([place])   . Right thalamic infarction ([place])   . Hypoalbuminemia due to protein-calorie malnutrition ([place])   . Hypokalemia   . Hypomagnesemia   . Diabetes mellitus type 2 in obese ([place])   . Diskitis 09/15/2019  . Suspected infectious meningitis 09/01/2019  . MSSA bacteremia 08/30/2019  . Discitis of lumbar region 08/30/2019  . Left shoulder pain 08/30/2019  . Septic arthritis of sternoclavicular joint, left ([place]) 08/30/2019  . Myelopathy ([place]) 08/27/2019  . H/O optic neuritis 08/27/2019  . Acute back pain with sciatica, right 09/04/2017  . Right hip pain 09/04/2017  . Thrombocytopenia ([place]) 06/25/2016  . Atrial fibrillation ([place]) 01/01/2016  . 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 Iliac aneurysm ([place])   . Morbid obesity ([place]) 07/08/2014  . Type 2 diabetes mellitus with other circulatory complications ([place]) 0000000  . Chronic anticoagulation 12/21/2013  . Sleep disorder 06/21/2013  . Decreased hearing 08/06/2012  . Hemorrhoids 08/06/2012  . High risk medication use 05/05/2012  . Anticoagulation management encounter 05/05/2012  . Retinal tear 04/29/2012  . Personal history of colonic polyps 05/05/2011  . Visit for preventive health  examination 05/05/2011  . PALPITATIONS 08/16/2010  . LIVER FUNCTION TESTS, ABNORMAL 04/25/2010  . Lateral epicondylitis 10/24/2009  . TOBACCO USE, QUIT 10/24/2009  . OBESITY, UNSPECIFIED 04/24/2009  . Hyperlipidemia 03/22/2009  . ERECTILE DYSFUNCTION 03/22/2009  . Obstructive sleep apnea 03/22/2009  . 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 MYOCARDIAL INFARCTION, HX OF 03/22/2009  . Coronary atherosclerosis 03/22/2009  . Atrial fibrillation with RVR ([place]) 03/22/2009  . GERD 03/22/2009    Expected Discharge Date: Expected Discharge Date: 10/02/19  Team Members Present: Physician leading conference: Dr. Courtney Heys Social Worker Present: Alfonse Alpers, LCSW Nurse Present: Isla Pence, RN PT Present: Excell Seltzer, PT OT Present: Amy Rounds, OT PPS Coordinator present : Gunnar Fusi, SLP     Current Status/Progress Goal Weekly Team Focus  Bowel/Bladder   pt continent of B/B, LBM 9/22  remain continent of b/b  assess toileting q shift and prn   Swallow/Nutrition/ Hydration             ADL's   Min A sit>stand from elevated surfaces, steadying assist overall bathing/dressing from w/c level.Min-mod A toileting,  Set-up-mod I overall, supervision shower transfer and IADLs  Functional activity tolerance, OOB tolerance, ADL/IADL re-training, functional standing balance/endurance, family education and d/c planning   Mobility   min to mod A bed mobility, min to mod A sit to stand, gait x 95' with RW min A, 2 x 6" steps with 2 handrails min A  mod I to Supervision overall  transfers, endurance, stair training   Communication  Safety/Cognition/ Behavioral Observations            Pain   pt has c/o pain in lower back, has lumbar brace PRNs  pain <5/10  assess pain q shift and prn   Skin   ecchymosis on LUE, MASD on bottom, wound vac @125  on upper left chest  address current skin status and prevent further breakdown  assess skin q shift and prn    Rehab Goals Patient on  target to meet rehab goals: Yes Rehab Goals Revised: none *See Care Plan and progress notes for long and short-term goals.     Barriers to Discharge  Current Status/Progress Possible Resolutions Date Resolved   Nursing                  PT  Decreased caregiver support;Medical stability;Home environment access/layout;Lack of/limited family support;Other (comments)  pain is very limiting at times              OT                  SLP                SW                Discharge Planning/Teaching Needs:  Pt to return to his home with his wife to provide supervision only.  Other family members can assist intermittently.  Wife to come in for family education closer to d/c.  Dtr-in-law may come, as well, to learn IV antibiotic administration.   Team Discussion:  Pt with anxiety and pain that limits therapy at times.  Pt is taking PRN pain medications and will be IV antibiotics through 10-12-19.  Pt has wound VAC that is changed Monday, Wednesday, Friday.  Pt is trying to coordinate lidocaine patch and heating pad and therapy will help with that.  Pt is continent x 2 and is compliant with his CPAP.  Pt requires extra time due to pain and this and activity tolerance are pt's biggest barriers.  Pt is min to mod A currently, min A 100' gait, min A stairs, with mod I/supervision level goals.   Revisions to Treatment Plan:  none    Medical Summary Current Status: Pt asking for additonal help at home- too high level- on IV ABX until 10/13- still getting wound VAC changes Weekly Focus/Goal: check when wound VAC to stop  Barriers to Discharge: Behavior;Decreased family/caregiver support;IV antibiotics;Medical stability;Medication compliance;Wound care;Other (comments)  Barriers to Discharge Comments: pain Possible Resolutions to Barriers: trying to tirate meds so less nausea   Continued Need for Acute Rehabilitation Level of Care: The patient requires daily medical management by a physician with  specialized training in physical medicine and rehabilitation for the following reasons: Direction of a multidisciplinary physical rehabilitation program to maximize functional independence : Yes Medical management of patient stability for increased activity during participation in an intensive rehabilitation regime.: Yes Analysis of laboratory values and/or radiology reports with any subsequent need for medication adjustment and/or medical intervention. : Yes   I attest that I was present, lead the team conference, and concur with the assessment and plan of the team.Team conference was held via web/ teleconference due to [place] - 19.   Daena Alper, Silvestre Mesi 09/23/2019, 11:44 AM

## 2019-09-23 NOTE — Progress Notes (Signed)
Patient has home CPAP at bedside. 

## 2019-09-23 NOTE — Progress Notes (Signed)
Occupational Therapy Session Note  Patient Details  Name: [name] [name]. MRN: [id number] Date of Birth: 1951-03-20  Today's Date: 09/23/2019 OT Individual Time: 7921-7837 OT Individual Time Calculation (min): 45 min    Short Term Goals: Week 1:  OT Short Term Goal 1 (Week 1): Pt will complete functoinal transfers with min A overall using LRAD OT Short Term Goal 1 - Progress (Week 1): Met OT Short Term Goal 2 (Week 1): Pt will don pants with min A sit>stand using AE PRN OT Short Term Goal 2 - Progress (Week 1): Met OT Short Term Goal 3 (Week 1): Pt will stand to complete 1 grooming task with steadying assist in order to increase functional standing balance/endurance OT Short Term Goal 3 - Progress (Week 1): Met OT Short Term Goal 4 (Week 1): Pt will don shirt with supervision/set-up OT Short Term Goal 4 - Progress (Week 1): Met Week 2:  OT Short Term Goal 1 (Week 2): STG=LTG Due to LOS  Skilled Therapeutic Interventions/Progress Updates:    1:1 Pt finish completed bathing at the sink UB, shaving and washing hair. Pt with persistence back pain but able to work through it with frequent breaks with back support. Pt ambulated ~ 75 feet with only a standing rest break in the hallway and returned to the room. Agreed to trying to sit up in the recliner with heating pad on his back. Pt able to don shirt, LSO and shoes with setup. Pt continues to require rest break for fatigue.   Therapy Documentation Precautions:  Precautions Precautions: Fall Precaution Comments: wound vac L [place] joint  Required Braces or Orthoses: Spinal Brace Spinal Brace: Lumbar corset, Applied in sitting position Restrictions Weight Bearing Restrictions: No Other Position/Activity Restrictions: wound vac Pain:  no c/o pain   Therapy/Group: Individual Therapy  Willeen Cass [facility] 09/23/2019, 4:16 PM

## 2019-09-23 NOTE — Progress Notes (Signed)
Potassium trending upwards--will decrease supplement to 20 meq bid. Repeat labs on 9/30

## 2019-09-23 NOTE — Progress Notes (Signed)
Physical Therapy Session Note  Patient Details  Name: [name] [name]. MRN: [id number] Date of Birth: 1951/05/09  Today's Date: 09/23/2019 PT Individual Time: 0945-1100 PT Individual Time Calculation (min): 75 min   Short Term Goals: Week 2:  PT Short Term Goal 1 (Week 2): =LTG due to ELOS  Skilled Therapeutic Interventions/Progress Updates:    Pt received seated in recliner in room, agreeable to PT session. Pt reports intermittent pain in low back, not rated, use of kpad for pain management. Sit to stand with min A to RW. Ambulation x 50 ft with RW and CGA before onset of dizziness and need to sit. Pt reports urge to have a BM. Sit to stand from w/c with min A to RW, ambulation x 10 ft into bathroom with RW and CGA. Toilet transfer with min A and use of BSC over toilet seat and use of grab bar. Pt is dependent for clothing management, setup A for pericare. Manual w/c propulsion x 150 ft with use of B UE/LE and Supervision. Ascend/descend 2 x 6" stairs with R handrail and mod A for lateral ascent/descent to simulate pt's home environment. Pt fatigues quickly with stairs. Ambulation x 50 ft with RW and CGA. Stand pivot transfer bed to w/c with CGA and RW, min A to stand from w/c. Sit to supine min A for RLE management with use of leg lifter for BLE. Pt left supine in bed with needs in reach at end of session.  Therapy Documentation Precautions:  Precautions Precautions: Fall Precaution Comments: wound vac L [place] joint  Required Braces or Orthoses: Spinal Brace Spinal Brace: Lumbar corset, Applied in sitting position Restrictions Weight Bearing Restrictions: No Other Position/Activity Restrictions: wound vac    Therapy/Group: Individual Therapy  Excell Seltzer, PT, DPT  09/23/2019, 12:40 PM

## 2019-09-23 NOTE — Progress Notes (Signed)
  Subjective: Mr. Putman seemed more encouraged with his progress today. He was able to work with PT  /OT for 3 sessions yesterday. No new complaints or concerns. He was seen this morning during wound vac change.   Objective: Vital signs in last 24 hours: Temp:  [97.8 F (36.6 C)-98.1 F (36.7 C)] 98 F (36.7 C) (09/24 0543) Pulse Rate:  [68-78] 78 (09/24 0543) Resp:  [16-18] 18 (09/24 0543) BP: (117-121)/(65-70) 121/65 (09/24 0543) SpO2:  [95 %-98 %] 98 % (09/24 0543) FiO2 (%):  [21 %] 21 % (09/23 2050) Weight:  [106.5 kg] 106.5 kg (09/24 0500)     Intake/Output from previous day: 09/23 0701 - 09/24 0700 In: 700 [P.O.:700] Out: 1350 [Urine:1350] Intake/Output this shift: Total I/O In: 120 [P.O.:120] Out: -  On exam patient up in chair breathing Feels better Wound VAC functioning properly    Lab Results: No results for input(s): WBC, HGB, HCT, PLT in the last 72 hours. BMET:  No results for input(s): NA, K, CL, CO2, GLUCOSE, BUN, CREATININE, CALCIUM in the last 72 hours.  PT/INR: No results for input(s): LABPROT, INR in the last 72 hours. ABG    Component Value Date/Time   TCO2 26 03/01/2017 0258   CBG (last 3)  Recent Labs    09/22/19 1643 09/22/19 2050 09/23/19 0615  GLUCAP 113* 144* 161*    Assessment/Plan:  -POD 23incision and drainage of left [place] joint abscess. TWillcontinueM-W-F dressing changes. ABX forMSSA cultured from the woundper ID recs.   Wound VAC change tomorrow  Grace Isaac, [place] [phone] 09/23/2019 Patient ID: [name]., male   DOB: December 25, 1951, 68 y.o.   MRN: [id number]

## 2019-09-23 NOTE — Progress Notes (Signed)
[place] PHYSICAL MEDICINE & REHABILITATION PROGRESS NOTE  Subjective/Complaints:   Pt reports sweating a lot last night- was "soaked"- has been doing periodically. Uses heating pad in early AM- for a few hours prior to therapy- is helpful- so has been refusing lidoderm- but discussed putting patch during day so can take advantage of it.  Still trying to sigh/to reduce stress- frequently  ROS: Denies CP, SOB, N/V/D  Objective: Vital Signs: Blood pressure 121/65, pulse 78, temperature 98 F (36.7 C), temperature source Oral, resp. rate 18, height 6\' 1"  (1.854 m), weight 106.5 kg, SpO2 98 %. No results found. No results for input(s): WBC, HGB, HCT, PLT in the last 72 hours. No results for input(s): NA, K, CL, CO2, GLUCOSE, BUN, CREATININE, CALCIUM in the last 72 hours.  Physical Exam: BP 121/65 (BP Location: Left Arm)   Pulse 78   Temp 98 F (36.7 C) (Oral)   Resp 18   Ht 6\' 1"  (1.854 m)   Wt 106.5 kg   SpO2 98%   BMI 30.98 kg/m  Constitutional: No distress . Vital signs reviewed. Awake, alert, appropriate, sitting up at EOB with OT at bedside; appears trying to treat anxiety he's feeling; NAD HENT: Normocephalic.  Atraumatic. Eyes: EOMI. No discharge. Cardiovascular: CTA B/L- wound VAC in L chest near clavicle Respiratory: CTA B/L GI: Non-distended. Skin: +VAC to chest suctioning without leaks-  PICC in RUE Psych: anxious affect- trying to self treat Musc: Left hip and sternum TTP  Neurologic: Alert Motor: Right upper extremity: 4+/5 proximal to distal Left upper extremity: 4-/5 proximal to distal (some pain inhibition) Left lower extremity: 4/5 proximal to distal, unchanged Right lower extremity: 4-4+/5 proximal to distal (some pain inhibition), unchanged  Assessment/Plan: 1. Functional deficits secondary to right thalamic infarct with lumbar discitis which require 3+ hours per day of interdisciplinary therapy in a comprehensive inpatient rehab  setting.  Physiatrist is providing close team supervision and 24 hour management of active medical problems listed below.  Physiatrist and rehab team continue to assess barriers to discharge/monitor patient progress toward functional and medical goals  Care Tool:  Bathing  Bathing activity did not occur: Safety/medical concerns(Unable to tolerate activity/mobility 2/2 pain) Body parts bathed by patient: Right arm, Left arm, Chest, Abdomen, Front perineal area, Right upper leg, Left upper leg, Face   Body parts bathed by helper: Buttocks, Right lower leg, Left lower leg     Bathing assist Assist Level: Minimal Assistance - Patient > 75%     Upper Body Dressing/Undressing Upper body dressing   What is the patient wearing?: Pull over shirt    Upper body assist Assist Level: Minimal Assistance - Patient > 75%    Lower Body Dressing/Undressing Lower body dressing      What is the patient wearing?: Pants     Lower body assist Assist for lower body dressing: Independent     Toileting Toileting    Toileting assist Assist for toileting: Moderate Assistance - Patient 50 - 74% Assistive Device Comment: urinal   Transfers Chair/bed transfer  Transfers assist     Chair/bed transfer assist level: Contact Guard/Touching assist     Locomotion Ambulation   Ambulation assist      Assist level: Minimal Assistance - Patient > 75% Assistive device: Walker-rolling Max distance: 85 ft   Walk 10 feet activity   Assist     Assist level: Minimal Assistance - Patient > 75% Assistive device: Walker-rolling   Walk 50 feet activity   Assist  Assist level: Minimal Assistance - Patient > 75% Assistive device: Walker-rolling    Walk 150 feet activity   Assist Walk 150 feet activity did not occur: Safety/medical concerns         Walk 10 feet on uneven surface  activity   Assist Walk 10 feet on uneven surfaces activity did not occur: Safety/medical  concerns         Wheelchair     Assist Will patient use wheelchair at discharge?: (TBD) Type of Wheelchair: Manual    Wheelchair assist level: Supervision/Verbal cueing Max wheelchair distance: 200    Wheelchair 50 feet with 2 turns activity    Assist        Assist Level: Supervision/Verbal cueing   Wheelchair 150 feet activity     Assist Wheelchair 150 feet activity did not occur: Safety/medical concerns   Assist Level: Supervision/Verbal cueing    Medical Problem List and Plan: 1.  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 Antithrombotics: -DVT/anticoagulation:Pharmaceutical:Pradexa -antiplatelet therapy: N/a 3. Pain Management:  Flexeril changed to Robaxin 500 twice daily as needed  Added lidocaine patch to lumbar paraspinals.   Continue oxycodone prn   Added tramadol prn for moderate pain.  Monitor for nausea associated with Oxy/tramadol  9/21- will change Oxy to Dilaudid 2 mg q4 hours prn.  9/22- helping  9/23- taking 1-2x/day  9/24- changed lidoderm patches to 9am to 9pm 4. Mood:Team to provide ego support and encouragement.LCSW to follow for evaluation and support -antipsychotic agents: N/A 5. Neuropsych: This patientiscapable of making decisions on hisown behalf. 6. Skin/Wound Care:See #9 7. Fluids/Electrolytes/Nutrition:Monitor I/Os. Continue prostat supplement due to low protein store and to promote healing. 8. MSSA bacteremia: Naficin every 4 thorough 9/16due to concerns of meningitic spreadthen changed to Cefazolin thorough 10/13 9. Diskitis/left sternoclavicular infection: Wound VACdressing changesMWFper WOC. 10. Left biceps tendinosis/Mild-moderate OA left shoulder: Follow up with ortho post discharge.  11. NAION/optic  neuritis:Stable and vision stable OS 12. PAF with RVR: Monitor HR --continue Tikosyn and Pradexa.  Went into A. fib with RVR and spontaneously converted on 9/19, EKGs reviewed 13. T2DM with hyperglycemia: Hgb A1C- 6.7and controlled on metformin BID PTA. Will monitor BS ac/hs with SSI for elevated BS.  CBG (last 3)  Recent Labs    09/22/19 1643 09/22/19 2050 09/23/19 0615  GLUCAP 113* 144* 161*     Metformin 500 daily started on 9/18  Slightly elevated on 9/20, consider further medication adjustments if persistent  Monitor with increased mobility  9/21- good control- con't meds 14.Emphysema: Continue Breo. Currently needing supplemental oxygen prn per patient.  15. Hypokalemia/Hypomagnesemia: Continue K dur and Mag Ox  Potassium 4.1 on 9/19  Magnesium 1.8 on 9/17 16.  Hypoalbuminemia  Supplement initiated 17.  Nausea  Appears to be related to pain medications  Improving  9/21- changed oxy to dialudid 2mg  q4 hours prn 18.  Slightly labile blood pressure  Continue to monitor 19.  BUN/creatinine trending up  Encourage fluids 20. Anxiety/panic attacks  9/22- has Benzo however would like to prevent these occurences will add Paxil 10 mg x 1 week QHS then increase to 20 mg QHS- since can cause sedation, will do at night. Has tolerated in past for depression. To see Neuropsych as well today. 21. Dispo- 9/23- will ask if can get H/H aide for pt  9/24- unable to  due to pt's higher level of function.   LOS: 8 days A FACE TO FACE EVALUATION WAS PERFORMED  Xinyi Batton 09/23/2019, 8:48 AM

## 2019-09-24 ENCOUNTER — Inpatient Hospital Stay (HOSPITAL_COMMUNITY): Payer: Medicare Other

## 2019-09-24 ENCOUNTER — Inpatient Hospital Stay (HOSPITAL_COMMUNITY): Payer: Medicare Other | Admitting: Occupational Therapy

## 2019-09-24 LAB — GLUCOSE, CAPILLARY
Glucose-Capillary: 168 mg/dL — ABNORMAL HIGH (ref 70–99)
Glucose-Capillary: 137 mg/dL — ABNORMAL HIGH (ref 70–99)
Glucose-Capillary: 98 mg/dL (ref 70–99)
Glucose-Capillary: 132 mg/dL — ABNORMAL HIGH (ref 70–99)

## 2019-09-24 MED ORDER — CARBAMIDE PEROXIDE 6.5 % OT SOLN
10.0000 [drp] | Freq: Two times a day (BID) | OTIC | Status: AC
  Administered 2019-09-24 – 2019-09-27 (×8): 10 [drp] via OTIC
  Filled 2019-09-24: qty 15

## 2019-09-24 NOTE — Progress Notes (Signed)
[place] PHYSICAL MEDICINE & REHABILITATION PROGRESS NOTE  Subjective/Complaints:   Pt reports can't hear very well out of R ear- thinks, likely due to wax since this is ongoing issue. Asked for ear drops- has required ENT to see x1 in life for this. Says it's so bad since already [facility].    ROS: Denies CP, SOB, N/V/D  Objective: Vital Signs: Blood pressure 123/68, pulse 67, temperature 98.6 F (37 C), resp. rate 18, height 6\' 1"  (1.854 m), weight 107 kg, SpO2 91 %. No results found. No results for input(s): WBC, HGB, HCT, PLT in the last 72 hours. No results for input(s): NA, K, CL, CO2, GLUCOSE, BUN, CREATININE, CALCIUM in the last 72 hours.  Physical Exam: BP 123/68 (BP Location: Left Arm)   Pulse 67   Temp 98.6 F (37 C)   Resp 18   Ht 6\' 1"  (1.854 m)   Wt 107 kg   SpO2 91%   BMI 31.12 kg/m  Constitutional: No distress . Vital signs reviewed. Awake, alert, appropriate,sitting up in bed, comfortable, turning head to L to hear better; NAD HENT: Normocephalic.  Atraumatic. Unable to assess wax burden of R ear however decreased hearing of R ear evident Eyes: EOMI. No discharge. Cardiovascular: CTA B/L- wound VAC in L chest near clavicle- just changed Respiratory: CTA B/L GI: Non-distended. Skin: +VAC to chest suctioning without leaks-  PICC in RUE Psych: anxious affect- trying to self treat Musc: Left hip and sternum TTP  Neurologic: Alert Motor: Right upper extremity: 4+/5 proximal to distal Left upper extremity: 4-/5 proximal to distal (some pain inhibition) Left lower extremity: 4/5 proximal to distal, unchanged Right lower extremity: 4-4+/5 proximal to distal (some pain inhibition), unchanged  Assessment/Plan: 1. Functional deficits secondary to right thalamic infarct with lumbar discitis which require 3+ hours per day of interdisciplinary therapy in a comprehensive inpatient rehab setting.  Physiatrist is providing close team supervision and 24 hour management of  active medical problems listed below.  Physiatrist and rehab team continue to assess barriers to discharge/monitor patient progress toward functional and medical goals  Care Tool:  Bathing  Bathing activity did not occur: Safety/medical concerns(Unable to tolerate activity/mobility 2/2 pain) Body parts bathed by patient: Right arm, Left arm, Chest, Abdomen, Front perineal area, Right upper leg, Left upper leg, Face   Body parts bathed by helper: Buttocks, Right lower leg, Left lower leg     Bathing assist Assist Level: Minimal Assistance - Patient > 75%     Upper Body Dressing/Undressing Upper body dressing   What is the patient wearing?: Pull over shirt    Upper body assist Assist Level: Minimal Assistance - Patient > 75%    Lower Body Dressing/Undressing Lower body dressing      What is the patient wearing?: Pants     Lower body assist Assist for lower body dressing: Independent     Toileting Toileting    Toileting assist Assist for toileting: Moderate Assistance - Patient 50 - 74% Assistive Device Comment: urinal   Transfers Chair/bed transfer  Transfers assist     Chair/bed transfer assist level: Contact Guard/Touching assist     Locomotion Ambulation   Ambulation assist      Assist level: Contact Guard/Touching assist Assistive device: Walker-rolling Max distance: 50'   Walk 10 feet activity   Assist     Assist level: Contact Guard/Touching assist Assistive device: Walker-rolling   Walk 50 feet activity   Assist    Assist level: Contact Guard/Touching assist  Assistive device: Walker-rolling    Walk 150 feet activity   Assist Walk 150 feet activity did not occur: Safety/medical concerns         Walk 10 feet on uneven surface  activity   Assist Walk 10 feet on uneven surfaces activity did not occur: Safety/medical concerns         Wheelchair     Assist Will patient use wheelchair at discharge?: Yes Type of  Wheelchair: Manual    Wheelchair assist level: Supervision/Verbal cueing Max wheelchair distance: 150    Wheelchair 50 feet with 2 turns activity    Assist        Assist Level: Supervision/Verbal cueing   Wheelchair 150 feet activity     Assist Wheelchair 150 feet activity did not occur: Safety/medical concerns   Assist Level: Supervision/Verbal cueing    Medical Problem List and Plan: 1.  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 Antithrombotics: -DVT/anticoagulation:Pharmaceutical:Pradexa -antiplatelet therapy: N/a 3. Pain Management:  Flexeril changed to Robaxin 500 twice daily as needed  Added lidocaine patch to lumbar paraspinals.   Continue oxycodone prn   Added tramadol prn for moderate pain.  Monitor for nausea associated with Oxy/tramadol  9/21- will change Oxy to Dilaudid 2 mg q4 hours prn.  9/22- helping  9/23- taking 1-2x/day  9/24- changed lidoderm patches to 9am to 9pm 4. Mood:Team to provide ego support and encouragement.LCSW to follow for evaluation and support -antipsychotic agents: N/A 5. Neuropsych: This patientiscapable of making decisions on hisown behalf. 6. Skin/Wound Care:See #9 7. Fluids/Electrolytes/Nutrition:Monitor I/Os. Continue prostat supplement due to low protein store and to promote healing. 8. MSSA bacteremia: Naficin every 4 thorough 9/16due to concerns of meningitic spreadthen changed to Cefazolin thorough 10/13 9. Diskitis/left sternoclavicular infection: Wound VACdressing changesMWFper WOC. 10. Left biceps tendinosis/Mild-moderate OA left shoulder: Follow up with ortho post discharge.  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 PAF with RVR: Monitor HR --continue Tikosyn and Pradexa.  Went into A. fib  with RVR and spontaneously converted on 9/19, EKGs reviewed 13. T2DM with hyperglycemia: Hgb A1C- 6.7and controlled on metformin BID PTA. Will monitor BS ac/hs with SSI for elevated BS.  CBG (last 3)  Recent Labs    09/23/19 1638 09/23/19 2126 09/24/19 0629  GLUCAP 111* 116* 168*     Metformin 500 daily started on 9/18  Slightly elevated on 9/20, consider further medication adjustments if persistent  Monitor with increased mobility  9/21- good control- con't meds 14.Emphysema: Continue Breo. Currently needing supplemental oxygen prn per patient.  15. Hypokalemia/Hypomagnesemia: Continue K dur and Mag Ox  Potassium 4.1 on 9/19  Magnesium 1.8 on 9/17 16.  Hypoalbuminemia  Supplement initiated 17.  Nausea  Appears to be related to pain medications  Improving  9/21- changed oxy to dialudid 2mg  q4 hours prn 18.  Slightly labile blood pressure  Continue to monitor 19.  BUN/creatinine trending up  Encourage fluids 20. Anxiety/panic attacks  9/22- has Benzo however would like to prevent these occurences will add Paxil 10 mg x 1 week QHS then increase to 20 mg QHS- since can cause sedation, will do at night. Has tolerated in past for depression. To see Neuropsych as well today.  21. HOH with R ear wax  9/25- will add Debrox x 4 days. 22. Dispo- 9/23- will ask if can get H/H  aide for pt  9/24- unable to due to pt's higher level of function.   LOS: 9 days A FACE TO FACE EVALUATION WAS PERFORMED  Yulieth Carrender 09/24/2019, 9:46 AM

## 2019-09-24 NOTE — Progress Notes (Signed)
Physical Therapy Session Note  Patient Details  Name: [name] [name]. MRN: [id number] Date of Birth: [birth year]  Today's Date: 09/24/2019 PT Individual Time: 0902-1005 PT Individual Time Calculation (min): 63 min   Short Term Goals: Week 1:  PT Short Term Goal 1 (Week 1): Pt will complete sit to stand with assist x 1 consistently PT Short Term Goal 1 - Progress (Week 1): Met PT Short Term Goal 2 (Week 1): Pt will initiate stair training PT Short Term Goal 2 - Progress (Week 1): Met PT Short Term Goal 3 (Week 1): Pt will ambulate x 100 ft with LRAD and min A PT Short Term Goal 3 - Progress (Week 1): Met Week 2:  PT Short Term Goal 1 (Week 2): =LTG due to ELOS Week 3:     Skilled Therapeutic Interventions/Progress Updates:   Pt initially spine stating he had had muscle relaxer and been on heat x 3hrs in prep for session.  Rolled L and R mod I for donning shorts w/max assist of therapist. Supine to sit on edge of bed w/rails and hob elevated, supervision.   Aspen brace donned in sitting by therapist., Pt sit to stand w/RW and bed elevated w/min assist and cues for posture. Gait 34f in room w/min assist and mild wobbles bilat knees, pt stated "I just cant go any further" due to pain.  Transferred to recliner w/min assist w/RW and cues for hand placement. Pt rested in sitting x2 min then performed sit to stand from recliner to RW w/mod assist from low surface.  Recliner to wc w/rw w/cga and cues.  wc propulsion w/bilat LE's for strengthening 775fx 2 forward, 7572f 1 reverse.  Sit to stand from wc w/cga to RW. Gait 20f13f 125ft1f w/cga and wc following, verbal cues for erect posture.  wc to bed SPT w/min assist of 1.  PT able to remove Aspen brace w/min assist in sitting. Sit to supine w/min assist for LE management. Pt rolled L/R mod I for repositioning of heating pad. Pt left supine w/needs in reach, bed alarm set, rails up x 3.  Assessment:  Pt initially w/difficulty standing  and walking due to back pain.  After peforming therex in wc, pt able to increase gait distance/tolerance.  Primary limiting factor is back pain.    Therapy Documentation Precautions:  Precautions Precautions: Fall Precaution Comments: wound vac L [place] joint  Required Braces or Orthoses: Spinal Brace Spinal Brace: Lumbar corset, Applied in sitting position Restrictions Weight Bearing Restrictions: No Other Position/Activity Restrictions: wound vac  PAIN: 7/10 LOW BACK   Therapy/Group: Individual Therapy  BarbaJerrilyn Cairo/2020, 10:18 AM

## 2019-09-24 NOTE — Progress Notes (Signed)
Occupational Therapy Session Note  Patient Details  Name: [name] [name]. MRN: [id number] Date of Birth: Jun 20, 1951  Today's Date: 09/24/2019 OT Individual Time: 1050-1200 and 1300-1400 OT Individual Time Calculation (min): 70 min and 60 min   Short Term Goals: Week 2:  OT Short Term Goal 1 (Week 2): STG=LTG Due to LOS  Skilled Therapeutic Interventions/Progress Updates:    1) Treatment session with focus on functional transfers in home environment.  Pt received supine in bed reporting pain in lower back, eased somewhat by heat in supine.  Engaged in bed mobility with increased time and close supervision.  Min assist sit > stand from elevated surface.  Completed functional mobility in room, ambulating to doorway with RW with CGA.  Engaged in functional transfers in ADL apt with education and demonstration of appropriate technique.  Pt able to complete sit > stand from w/c with min assist and ambulated to tub/shower.  Completed transfer with tub bench with increased time and use of leg lifter to position BLE over tub ledge.  Pt ambulated to toilet with RW with CGA and completed transfer with [facility] over toilet to provide UE support and increased elevation for ease of transfers.  Pt ambulated back to doorway of ADL apt with RW with CGA.  Returned to room via w/c and transferred back to bed for IV team.  Pt left in supine with all needs in reach.  2) Treatment session with focus on sit <> stand, weight shifting in standing, and BLE therex due to increased pain in lower back.  Pt received supine in bed with RN present providing scheduled meds.  Pt completed bed mobility with supervision and sat EOB to allow RN to apply pain patches.  While seated EOB, pt coughed reporting increased spasm and pain in lower back.  Attempted sit > stand x3 with pt able to come up to standing with mod-max assist and on one instance engaged in marching in place.  However pt reports increased pain in back and LLE and feeling  unstable to engage in any standing or ambulation.  Returned to supine with pt reporting mild ease of pain.  Engaged in heel slides, hip rotation, and knee to chest stretch to tolerance to decrease pain in lower back.  Pt remained in supine with heat on back and all needs in reach.  Therapy Documentation Precautions:  Precautions Precautions: Fall Precaution Comments: wound vac L [place] joint  Required Braces or Orthoses: Spinal Brace Spinal Brace: Lumbar corset, Applied in sitting position Restrictions Weight Bearing Restrictions: No Other Position/Activity Restrictions: wound vac Pain: Pain Assessment Pain Scale: 0-10 Pain Score: 0-No pain  PM session: Pain 10/10 in lower back.  Repositioned, engaged in BLE therex.  Therapy/Group: Individual Therapy  Simonne Come 09/24/2019, 2:21 PM

## 2019-09-24 NOTE — Consult Note (Signed)
[place] Nurse wound follow up Patient receiving care in [facility] 4M03.  Macarthur Critchley, PA for surgical group present for [facility] dressing change. Wound type: Surgical Measurement: 1.7 cm x 4 cm x depth varies depending on location.  Deepest along superior margin.  There is undermining from 10 - 2 o'clock that measures 2.4 to 2.8 cm.  The greatest undermining is 2.8 cm at 11 o'clock.  There is a strong possibility that the [facility] will be changed to twice daily saline moistened gauze dressing changes on Monday of next week to determine if that will help gain any ground on granulation tissue formation in the undermining.  There is bone exposed within the undermined area. Wound bed: 100% red granulation tissue. Drainage (amount, consistency, odor) serosanginous Periwound: intact Dressing procedure/placement/frequency: To be determined as outlined above.  One piece of black foam removed, one piece place. Immediate seal obtained. Patient tolerated procedure with minimal discomfort. Val Riles, RN, MSN, CWOCN, CNS-BC, pager [phone]

## 2019-09-25 ENCOUNTER — Inpatient Hospital Stay (HOSPITAL_COMMUNITY): Payer: Medicare Other | Admitting: Rehabilitation

## 2019-09-25 DIAGNOSIS — M4640 Discitis, unspecified, site unspecified: Secondary | ICD-10-CM

## 2019-09-25 LAB — GLUCOSE, CAPILLARY
Glucose-Capillary: 171 mg/dL — ABNORMAL HIGH (ref 70–99)
Glucose-Capillary: 104 mg/dL — ABNORMAL HIGH (ref 70–99)
Glucose-Capillary: 134 mg/dL — ABNORMAL HIGH (ref 70–99)
Glucose-Capillary: 141 mg/dL — ABNORMAL HIGH (ref 70–99)

## 2019-09-25 NOTE — Progress Notes (Signed)
Physical Therapy Session Note  Patient Details  Name: [name] H Frankowski Jr. MRN: [id number] Date of Birth: 02/20/1951  Today's Date: 09/25/2019 PT Individual Time: 1300-1400 PT Individual Time Calculation (min): 60 min   Short Term Goals: Week 1:  PT Short Term Goal 1 (Week 1): Pt will complete sit to stand with assist x 1 consistently PT Short Term Goal 1 - Progress (Week 1): Met PT Short Term Goal 2 (Week 1): Pt will initiate stair training PT Short Term Goal 2 - Progress (Week 1): Met PT Short Term Goal 3 (Week 1): Pt will ambulate x 100 ft with LRAD and min A PT Short Term Goal 3 - Progress (Week 1): Met Week 2:  PT Short Term Goal 1 (Week 2): =LTG due to ELOS  Skilled Therapeutic Interventions/Progress Updates:   Pt received lying in bed, agreeable to therapy.  Pt verbalized needing to go slow and verbalize throughout as to allow back muscles to relax.  Lowered foot of bed and pt lowered HOB mostly flat but still needs to use bed rail to elevate trunk to sitting.  Once at EOB, PT assisted with donning LSO prior to standing/transfer.  Pt able to direct care to PT on where to place IV pole and wound vac for mobility.  Upon quick abrupt stand from bed, pt had spasm.  Pt cued to do stand slower to avoid muscles going into spasm.  Pt verbalized understanding.  Pt then requested to transfer to chair instead of walking to allow muscles to relax.  Pt assisted to therapy gym at total A level for time management.  Once in therapy gym, had pt sit forward from back of chair to perform seated marching x 2 sets of 10 reps, LAQs x 10 reps, followed by standing heel raises x 10 reps with cues for technique.  Pt then agreeable to ambulate x 100' with RW at CGA level with min cues for posture and improve stepping sequence.  Pt needing several rest breaks during session due to pain in low back.  Pt then agreeable to ambulate back to room x 100' as above.  Pt left in w/c with all needs in reach.    Therapy  Documentation Precautions:  Precautions Precautions: Fall Precaution Comments: wound vac L  joint  Required Braces or Orthoses: Spinal Brace Spinal Brace: Lumbar corset, Applied in sitting position Restrictions Weight Bearing Restrictions: No Other Position/Activity Restrictions: wound vac   Pain: Pt reports 3/10 pain in low back, had received pain medication approx 30 mins prior to session.     Therapy/Group: Individual Therapy  ,  Ann 09/25/2019, 1:36 PM  

## 2019-09-25 NOTE — Progress Notes (Signed)
RT set up patient on home unit CPAP at beside. Patient is able to place mask on himself when he is ready. No assistance needed.

## 2019-09-25 NOTE — Plan of Care (Signed)
  Problem: Consults Goal: RH GENERAL PATIENT EDUCATION Description: See Patient Education module for education specifics. Outcome: Progressing Goal: Skin Care Protocol Initiated - if Braden Score 18 or less Description: If consults are not indicated, leave blank or document N/A Outcome: Progressing Goal: Nutrition Consult-if indicated Outcome: Progressing Goal: Diabetes Guidelines if Diabetic/Glucose > 140 Description: If diabetic or lab glucose is > 140 mg/dl - Initiate Diabetes/Hyperglycemia Guidelines & Document Interventions  Outcome: Progressing   Problem: RH SKIN INTEGRITY Goal: RH STG SKIN FREE OF INFECTION/BREAKDOWN Description: Patients skin will remain free from further infection or breakdown with min assist. Outcome: Progressing Goal: RH STG MAINTAIN SKIN INTEGRITY WITH ASSISTANCE Description: STG Maintain Skin Integrity With min Assistance. Outcome: Progressing Goal: RH STG ABLE TO PERFORM INCISION/WOUND CARE W/ASSISTANCE Description: STG Able To Perform Incision/Wound Care With total Assistance from caregiver. Outcome: Progressing   Problem: RH PAIN MANAGEMENT Goal: RH STG PAIN MANAGED AT OR BELOW PT'S PAIN GOAL Description: < 4 Outcome: Progressing

## 2019-09-25 NOTE — Progress Notes (Signed)
[place] PHYSICAL MEDICINE & REHABILITATION PROGRESS NOTE  Subjective/Complaints:   Complains of back pain. Otherwise no new issues  ROS: Patient denies fever, rash, sore throat, blurred vision, nausea, vomiting, diarrhea, cough, shortness of breath or chest pain, joint or back pain, headache, or mood change.   Objective: Vital Signs: Blood pressure (!) 129/59, pulse 69, temperature 97.9 F (36.6 C), temperature source Axillary, resp. rate 16, height 6\' 1"  (1.854 m), weight 108.3 kg, SpO2 97 %. No results found. No results for input(s): WBC, HGB, HCT, PLT in the last 72 hours. No results for input(s): NA, K, CL, CO2, GLUCOSE, BUN, CREATININE, CALCIUM in the last 72 hours.  Physical Exam: BP (!) 129/59 (BP Location: Left Arm)   Pulse 69   Temp 97.9 F (36.6 C) (Axillary)   Resp 16   Ht 6\' 1"  (1.854 m)   Wt 108.3 kg   SpO2 97%   BMI 31.50 kg/m  Constitutional: No distress . Vital signs reviewed. HEENT: EOMI, oral membranes moist, HOH Neck: supple Cardiovascular: RRR without murmur. No JVD    Respiratory: CTA Bilaterally without wheezes or rales. Normal effort    GI: BS +, non-tender, non-distended  Skin: +VAC to chest suctioning without leaks-no change,  PICC in RUE Psych: anxious affect- trying to self treat Musc: Left hip and sternum TTP  Neurologic: Alert Motor: Right upper extremity: 4+/5 proximal to distal Left upper extremity: 4-/5 proximal to distal (some pain inhibition) Left lower extremity: 4/5 proximal to distal, stable Right lower extremity: 4-4+/5 proximal to distal (some pain inhibition), stable  Assessment/Plan: 1. Functional deficits secondary to right thalamic infarct with lumbar discitis which require 3+ hours per day of interdisciplinary therapy in a comprehensive inpatient rehab setting.  Physiatrist is providing close team supervision and 24 hour management of active medical problems listed below.  Physiatrist and rehab team continue to assess  barriers to discharge/monitor patient progress toward functional and medical goals  Care Tool:  Bathing  Bathing activity did not occur: Safety/medical concerns(Unable to tolerate activity/mobility 2/2 pain) Body parts bathed by patient: Right arm, Left arm, Chest, Abdomen, Front perineal area, Right upper leg, Left upper leg, Face   Body parts bathed by helper: Buttocks, Right lower leg, Left lower leg     Bathing assist Assist Level: Minimal Assistance - Patient > 75%     Upper Body Dressing/Undressing Upper body dressing   What is the patient wearing?: Pull over shirt    Upper body assist Assist Level: Minimal Assistance - Patient > 75%    Lower Body Dressing/Undressing Lower body dressing      What is the patient wearing?: Pants     Lower body assist Assist for lower body dressing: Independent     Toileting Toileting    Toileting assist Assist for toileting: Moderate Assistance - Patient 50 - 74% Assistive Device Comment: urinal   Transfers Chair/bed transfer  Transfers assist     Chair/bed transfer assist level: Contact Guard/Touching assist     Locomotion Ambulation   Ambulation assist      Assist level: Contact Guard/Touching assist Assistive device: Walker-rolling Max distance: 50'   Walk 10 feet activity   Assist     Assist level: Contact Guard/Touching assist Assistive device: Walker-rolling   Walk 50 feet activity   Assist    Assist level: Contact Guard/Touching assist Assistive device: Walker-rolling    Walk 150 feet activity   Assist Walk 150 feet activity did not occur: Safety/medical concerns  Walk 10 feet on uneven surface  activity   Assist Walk 10 feet on uneven surfaces activity did not occur: Safety/medical concerns         Wheelchair     Assist Will patient use wheelchair at discharge?: Yes Type of Wheelchair: Manual    Wheelchair assist level: Supervision/Verbal cueing Max wheelchair  distance: 150    Wheelchair 50 feet with 2 turns activity    Assist        Assist Level: Supervision/Verbal cueing   Wheelchair 150 feet activity     Assist Wheelchair 150 feet activity did not occur: Safety/medical concerns   Assist Level: Supervision/Verbal cueing    Medical Problem List and Plan: 1.  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 Reviewed diskitis and potential effects on spine with pt today 2. Antithrombotics: -DVT/anticoagulation:Pharmaceutical:Pradexa -antiplatelet therapy: N/a 3. Pain Management:reasonable control  Flexeril changed to Robaxin 500 twice daily as needed  Added lidocaine patch to lumbar paraspinals.   Continue oxycodone prn   Added tramadol prn for moderate pain.  Monitor for nausea associated with Oxy/tramadol  9/21- will change Oxy to Dilaudid 2 mg q4 hours prn.  9/22- helping  9/23- taking 1-2x/day  9/24- changed lidoderm patches to 9am to 9pm 4. Mood:Team to provide ego support and encouragement.LCSW to follow for evaluation and support -antipsychotic agents: N/A 5. Neuropsych: This patientiscapable of making decisions on hisown behalf. 6. Skin/Wound Care:See #9 7. Fluids/Electrolytes/Nutrition:Monitor I/Os. Continue prostat supplement due to low protein store and to promote healing. 8. MSSA bacteremia: Naficin every 4 thorough 9/16due to concerns of meningitic spreadthen changed to Cefazolin thorough 10/13 9. Diskitis/left sternoclavicular infection: Wound VACdressing changesMWFper WOC. 10. Left biceps tendinosis/Mild-moderate OA left shoulder: Follow up with ortho post discharge.  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 PAF with RVR: Monitor HR --continue Tikosyn and Pradexa.  Went into A. fib with RVR and spontaneously  converted on 9/19 13. T2DM with hyperglycemia: Hgb A1C- 6.7and controlled on metformin BID PTA. Will monitor BS ac/hs with SSI for elevated BS.  CBG (last 3)  Recent Labs    09/24/19 1646 09/24/19 2104 09/25/19 0624  GLUCAP 98 132* 171*     Metformin 500 daily started on 9/18  9/26--reasonable to borderline control---no changes 14.Emphysema: Continue Breo. Currently needing supplemental oxygen prn per patient.  15. Hypokalemia/Hypomagnesemia: Continue K dur and Mag Ox  Potassium 4.1 on 9/19  Magnesium 1.8 on 9/17 16.  Hypoalbuminemia  Supplement initiated 17.  Nausea  Appears to be related to pain medications  Improving  9/21- changed oxy to dialudid 2mg  q4 hours prn 18.  Slightly labile blood pressure  Continue to monitor 19.  BUN/creatinine trending up  Encourage fluids 20. Anxiety/panic attacks  9/22- has Benzo however would like to prevent these occurences will add Paxil 10 mg x 1 week QHS then increase to 20 mg QHS- since can cause sedation, will do at night. Has tolerated in past for depression.   -Neuropsych has seen patient.  21. HOH with R ear wax  9/25-   Debrox x 4 days. [place]- 9/23- will ask if can get H/H aide for pt  9/24- unable to due to pt's higher level of function.   LOS: 10 days A FACE TO FACE EVALUATION WAS PERFORMED  Meredith Staggers 09/25/2019, 8:39 AM

## 2019-09-26 ENCOUNTER — Inpatient Hospital Stay (HOSPITAL_COMMUNITY): Payer: Medicare Other | Admitting: Physical Therapy

## 2019-09-26 LAB — GLUCOSE, CAPILLARY
Glucose-Capillary: 152 mg/dL — ABNORMAL HIGH (ref 70–99)
Glucose-Capillary: 130 mg/dL — ABNORMAL HIGH (ref 70–99)
Glucose-Capillary: 109 mg/dL — ABNORMAL HIGH (ref 70–99)
Glucose-Capillary: 125 mg/dL — ABNORMAL HIGH (ref 70–99)

## 2019-09-26 NOTE — Progress Notes (Signed)
[name] [name] PHYSICAL MEDICINE & REHABILITATION PROGRESS NOTE  Subjective/Complaints:   No new issues. Has ongoing low back pain but it is manageable  ROS: Patient denies fever, rash, sore throat, blurred vision, nausea, vomiting, diarrhea, cough, shortness of breath or chest pain,  headache, or mood change.   Objective: Vital Signs: Blood pressure 124/65, pulse 79, temperature 97.7 F (36.5 C), resp. rate 15, height 6\' 1"  (1.854 m), weight 108.2 kg, SpO2 94 %. No results found. No results for input(s): WBC, HGB, HCT, PLT in the last 72 hours. No results for input(s): NA, K, CL, CO2, GLUCOSE, BUN, CREATININE, CALCIUM in the last 72 hours.  Physical Exam: BP 124/65 (BP Location: Left Arm)   Pulse 79   Temp 97.7 F (36.5 C)   Resp 15   Ht 6\' 1"  (1.854 m)   Wt 108.2 kg   SpO2 94%   BMI 31.47 kg/m  Constitutional: No distress . Vital signs reviewed. HEENT: EOMI, oral membranes moist Neck: supple Cardiovascular: RRR without murmur. No JVD    Respiratory: CTA Bilaterally without wheezes or rales. Normal effort    GI: BS +, non-tender, non-distended  Skin: +VAC to chest suctioning without leaks-no change,  PICC in RUE Psych: pleasant Musc: Left hip and sternum TTP  Neurologic: Alert Motor: Right upper extremity: 4+/5 proximal to distal Left upper extremity: 4-/5 proximal to distal (some pain inhibition) Left lower extremity: 4/5 proximal to distal, stable Right lower extremity: 4-4+/5 proximal to distal (some pain inhibition), stable  Assessment/Plan: 1. Functional deficits secondary to right thalamic infarct with lumbar discitis which require 3+ hours per day of interdisciplinary therapy in a comprehensive inpatient rehab setting.  Physiatrist is providing close team supervision and 24 hour management of active medical problems listed below.  Physiatrist and rehab team continue to assess barriers to discharge/monitor patient progress toward functional and medical  goals  Care Tool:  Bathing  Bathing activity did not occur: Safety/medical concerns(Unable to tolerate activity/mobility 2/2 pain) Body parts bathed by patient: Right arm, Left arm, Chest, Abdomen, Front perineal area, Right upper leg, Left upper leg, Face   Body parts bathed by helper: Buttocks, Right lower leg, Left lower leg     Bathing assist Assist Level: Minimal Assistance - Patient > 75%     Upper Body Dressing/Undressing Upper body dressing   What is the patient wearing?: Pull over shirt    Upper body assist Assist Level: Minimal Assistance - Patient > 75%    Lower Body Dressing/Undressing Lower body dressing      What is the patient wearing?: Pants     Lower body assist Assist for lower body dressing: Independent     Toileting Toileting    Toileting assist Assist for toileting: Moderate Assistance - Patient 50 - 74% Assistive Device Comment: urinal   Transfers Chair/bed transfer  Transfers assist     Chair/bed transfer assist level: Contact Guard/Touching assist     Locomotion Ambulation   Ambulation assist      Assist level: Contact Guard/Touching assist Assistive device: Walker-rolling Max distance: 100'   Walk 10 feet activity   Assist     Assist level: Contact Guard/Touching assist Assistive device: Walker-rolling   Walk 50 feet activity   Assist    Assist level: Contact Guard/Touching assist Assistive device: Walker-rolling    Walk 150 feet activity   Assist Walk 150 feet activity did not occur: Safety/medical concerns         Walk 10 feet on uneven surface  activity   Assist Walk 10 feet on uneven surfaces activity did not occur: Safety/medical concerns         Wheelchair     Assist Will patient use wheelchair at discharge?: Yes Type of Wheelchair: Manual    Wheelchair assist level: Supervision/Verbal cueing Max wheelchair distance: 150    Wheelchair 50 feet with 2 turns  activity    Assist        Assist Level: Supervision/Verbal cueing   Wheelchair 150 feet activity     Assist Wheelchair 150 feet activity did not occur: Safety/medical concerns   Assist Level: Supervision/Verbal cueing    Medical Problem List and Plan: 1.  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 Again reviewed diskitis and potential effects on spine with pt today 2. Antithrombotics: -DVT/anticoagulation:Pharmaceutical:Pradexa -antiplatelet therapy: N/a 3. Pain Management:reasonable control  Flexeril changed to Robaxin 500 twice daily as needed  Added lidocaine patch to lumbar paraspinals.   Continue oxycodone prn   Added tramadol prn for moderate pain.  Monitor for nausea associated with Oxy/tramadol  9/21- will change Oxy to Dilaudid 2 mg q4 hours prn.  9/22- helping  9/23- taking 1-2x/day  9/24- changed lidoderm patches to 9am to 9pm 4. Mood:Team to provide ego support and encouragement.LCSW to follow for evaluation and support -antipsychotic agents: N/A 5. Neuropsych: This patientiscapable of making decisions on hisown behalf. 6. Skin/Wound Care:See #9 7. Fluids/Electrolytes/Nutrition:Monitor I/Os. Continue prostat supplement due to low protein store and to promote healing. 8. MSSA bacteremia: Naficin every 4 thorough 9/16due to concerns of meningitic spreadthen changed to Cefazolin thorough 10/13 9. Diskitis/left sternoclavicular infection: Wound VACdressing changesMWFper WOC. 10. Left biceps tendinosis/Mild-moderate OA left shoulder: Follow up with ortho post discharge.  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 PAF with RVR: Monitor HR --continue Tikosyn and Pradexa.  Went into A. fib with RVR and spontaneously converted on 9/19 13. T2DM with  hyperglycemia: Hgb A1C- 6.7and controlled on metformin BID PTA. Will monitor BS ac/hs with SSI for elevated BS.  CBG (last 3)  Recent Labs    09/25/19 1648 09/25/19 2104 09/26/19 0608  GLUCAP 134* 141* 152*     Metformin 500 daily started on 9/18  9/27--reasonable to borderline control---no changes today 14.Emphysema: Continue Breo. Currently needing supplemental oxygen prn per patient.  15. Hypokalemia/Hypomagnesemia: Continue K dur and Mag Ox  Potassium 4.1 on 9/19  Magnesium 1.8 on 9/17 16.  Hypoalbuminemia  Supplement initiated 17.  Nausea  Appears to be related to pain medications  Improving  9/21- changed oxy to dialudid 2mg  q4 hours prn 18.  Slightly labile blood pressure  Continue to monitor 19.  BUN/creatinine trending up  Encourage fluids 20. Anxiety/panic attacks  9/22- has Benzo however would like to prevent these occurences will add Paxil 10 mg x 1 week QHS then increase to 20 mg QHS- since can cause sedation, will do at night. Has tolerated in past for depression.   -Neuropsych has seen patient.  21. HOH with R ear wax  9/25-   Debrox x 4 days. [place]- 9/23- will ask if can get H/H aide for pt  9/24- unable to due to pt's higher level of function.   LOS: 11 days A FACE TO FACE EVALUATION WAS PERFORMED  Meredith Staggers 09/26/2019, 9:58 AM

## 2019-09-26 NOTE — Progress Notes (Signed)
Physical Therapy Session Note  Patient Details  Name: [name] [name]. MRN: [id number] Date of Birth: 1951-10-09  Today's Date: 09/26/2019 PT Individual Time: 1000-1058 PT Individual Time Calculation (min): 58 min   Short Term Goals: Week 1:  PT Short Term Goal 1 (Week 1): Pt will complete sit to stand with assist x 1 consistently PT Short Term Goal 1 - Progress (Week 1): Met PT Short Term Goal 2 (Week 1): Pt will initiate stair training PT Short Term Goal 2 - Progress (Week 1): Met PT Short Term Goal 3 (Week 1): Pt will ambulate x 100 ft with LRAD and min A PT Short Term Goal 3 - Progress (Week 1): Met  Skilled Therapeutic Interventions/Progress Updates:  Pt was seen bedside in the am. Pt transferred supine to edge of bed with min A and verbal cues. Pt tolerated edge of bed about 5 minutes with S. Donned LSO brace at edge of bed. Pt transferred sit to stand with min A and verbal cues. Pt transferred to w/c with min A and rolling walker. Pt propelled w/c to gym with S and B LEs. In gym treatment focused on increasing LE strength. Pt performed multiple sit to stand transfers with c/g to min A and rolling walker. While standing pt performed step taps 3 sets x 5 reps each. Pt propelled w/c back to room following treatment with B LEs and S. Pt left sitting up in w/c with call bell within reach.   Therapy Documentation Precautions:  Precautions Precautions: Fall Precaution Comments: wound vac L [place] joint  Required Braces or Orthoses: Spinal Brace Spinal Brace: Lumbar corset, Applied in sitting position Restrictions Weight Bearing Restrictions: No Other Position/Activity Restrictions: wound vac General:   Pain: Pain Assessment Pain Scale: 0-10 Pain Score: 6  Pain Type: Acute pain Pain Location: Back Pain Orientation: Lower Pain Descriptors / Indicators: Aching  Therapy/Group: Individual Therapy  [name], [name] 09/26/2019, 12:16 PM

## 2019-09-26 NOTE — Progress Notes (Signed)
Pt has home CPAP at bedside and is set up for patient. Pt states he can place himself on.

## 2019-09-26 NOTE — Plan of Care (Signed)
  Problem: Consults Goal: RH GENERAL PATIENT EDUCATION Description: See Patient Education module for education specifics. Outcome: Progressing Goal: Skin Care Protocol Initiated - if Braden Score 18 or less Description: If consults are not indicated, leave blank or document N/A Outcome: Progressing Goal: Nutrition Consult-if indicated Outcome: Progressing Goal: Diabetes Guidelines if Diabetic/Glucose > 140 Description: If diabetic or lab glucose is > 140 mg/dl - Initiate Diabetes/Hyperglycemia Guidelines & Document Interventions  Outcome: Progressing   Problem: RH SKIN INTEGRITY Goal: RH STG SKIN FREE OF INFECTION/BREAKDOWN Description: Patients skin will remain free from further infection or breakdown with min assist. Outcome: Progressing Goal: RH STG MAINTAIN SKIN INTEGRITY WITH ASSISTANCE Description: STG Maintain Skin Integrity With min Assistance. Outcome: Progressing Goal: RH STG ABLE TO PERFORM INCISION/WOUND CARE W/ASSISTANCE Description: STG Able To Perform Incision/Wound Care With total Assistance from caregiver. Outcome: Progressing   Problem: RH PAIN MANAGEMENT Goal: RH STG PAIN MANAGED AT OR BELOW PT'S PAIN GOAL Description: < 4 Outcome: Progressing

## 2019-09-27 ENCOUNTER — Inpatient Hospital Stay (HOSPITAL_COMMUNITY): Payer: Medicare Other | Admitting: Occupational Therapy

## 2019-09-27 ENCOUNTER — Inpatient Hospital Stay (HOSPITAL_COMMUNITY): Payer: Medicare Other | Admitting: Physical Therapy

## 2019-09-27 ENCOUNTER — Inpatient Hospital Stay (HOSPITAL_COMMUNITY): Payer: Medicare Other

## 2019-09-27 LAB — CBC WITH DIFFERENTIAL/PLATELET
Abs Immature Granulocytes: 0.04 10*3/uL (ref 0.00–0.07)
Basophils Absolute: 0.1 10*3/uL (ref 0.0–0.1)
Basophils Relative: 1 %
Eosinophils Absolute: 1 10*3/uL — ABNORMAL HIGH (ref 0.0–0.5)
Eosinophils Relative: 10 %
HCT: 40 % (ref 39.0–52.0)
Hemoglobin: 13.5 g/dL (ref 13.0–17.0)
Immature Granulocytes: 0 %
Lymphocytes Relative: 21 %
Lymphs Abs: 2 10*3/uL (ref 0.7–4.0)
MCH: 31.6 pg (ref 26.0–34.0)
MCHC: 33.8 g/dL (ref 30.0–36.0)
MCV: 93.7 fL (ref 80.0–100.0)
Monocytes Absolute: 0.7 10*3/uL (ref 0.1–1.0)
Monocytes Relative: 8 %
Neutro Abs: 5.7 10*3/uL (ref 1.7–7.7)
Neutrophils Relative %: 60 %
Platelets: 156 10*3/uL (ref 150–400)
RBC: 4.27 MIL/uL (ref 4.22–5.81)
RDW: 15.8 % — ABNORMAL HIGH (ref 11.5–15.5)
WBC: 9.6 10*3/uL (ref 4.0–10.5)
nRBC: 0 % (ref 0.0–0.2)

## 2019-09-27 LAB — BASIC METABOLIC PANEL
Anion gap: 5 (ref 5–15)
BUN: 28 mg/dL — ABNORMAL HIGH (ref 8–23)
CO2: 27 mmol/L (ref 22–32)
Calcium: 9.6 mg/dL (ref 8.9–10.3)
Chloride: 104 mmol/L (ref 98–111)
Creatinine, Ser: 0.97 mg/dL (ref 0.61–1.24)
GFR calc Af Amer: >60 mL/min (ref >60)
GFR calc non Af Amer: >60 mL/min (ref >60)
Glucose, Bld: 154 mg/dL — ABNORMAL HIGH (ref 70–99)
Potassium: 4.3 mmol/L (ref 3.5–5.1)
Sodium: 136 mmol/L (ref 135–145)

## 2019-09-27 LAB — GLUCOSE, CAPILLARY
Glucose-Capillary: 151 mg/dL — ABNORMAL HIGH (ref 70–99)
Glucose-Capillary: 152 mg/dL — ABNORMAL HIGH (ref 70–99)
Glucose-Capillary: 162 mg/dL — ABNORMAL HIGH (ref 70–99)
Glucose-Capillary: 68 mg/dL — ABNORMAL LOW (ref 70–99)
Glucose-Capillary: 147 mg/dL — ABNORMAL HIGH (ref 70–99)

## 2019-09-27 LAB — C-REACTIVE PROTEIN: CRP: <0.8 mg/dL (ref <1.0)

## 2019-09-27 MED ORDER — INSULIN ASPART 100 UNIT/ML ~~LOC~~ SOLN: 0.0000 [IU] | Freq: Every day | SUBCUTANEOUS | Status: DC

## 2019-09-27 MED ORDER — INSULIN ASPART 100 UNIT/ML ~~LOC~~ SOLN
0.0000 [IU] | Freq: Three times a day (TID) | SUBCUTANEOUS | Status: DC
  Administered 2019-09-28 – 2019-09-29 (×4): 1 [IU] via SUBCUTANEOUS
  Administered 2019-09-29: 2 [IU] via SUBCUTANEOUS
  Administered 2019-09-30 – 2019-10-02 (×4): 1 [IU] via SUBCUTANEOUS

## 2019-09-27 NOTE — Consult Note (Signed)
[facility] Nurse wound follow up CT PA at bedside to assess wound during Vac removal.  Wound type: Full thickness post-op wound is red and moist, small amt pink drainage, no odor.  Healing and decreasing in size; 1X3X.4cm Dressing procedure/placement/frequency: Vac discontinued per orders and moist gauze dressing applied.   No further need for [place] team assistance. Please re-consult if further assistance is needed.  Thank-you,  Julien Girt MSN, [place], [place], [place], [place]

## 2019-09-27 NOTE — Progress Notes (Signed)
  Subjective: Denies pain at the left chest wound site.  Wound examined after Vac removal by the wound care RN this AM.   Objective: Vital signs in last 24 hours: Temp:  [97.7 F (36.5 C)-98.6 F (37 C)] 98.6 F (37 C) (09/28 0443) Pulse Rate:  [68-77] 68 (09/28 0443) Resp:  [18-20] 18 (09/28 0443) BP: (103-124)/(60-68) 119/68 (09/28 0443) SpO2:  [90 %-95 %] 94 % (09/28 0443) Weight:  [111.4 kg] 111.4 kg (09/27 2028)  Hemodynamic parameters for last 24 hours:    Intake/Output from previous day: 09/27 0701 - 09/28 0700 In: 720 [P.O.:720] Out: 1200 [Urine:1200] Intake/Output this shift: Total I/O In: 240 [P.O.:240] Out: 300 [Urine:300]  General appearance: alert, cooperative and no distress Wound: The wound is clean and granulating with kayer of granulation over the boney prominence in the central part of the wound. No purulence or eschar. Surrounding tissures are healthy.   Lab Results: Recent Labs    09/27/19 0446  WBC 9.6  HGB 13.5  HCT 40.0  PLT 156   BMET:  Recent Labs    09/27/19 0446  NA 136  K 4.3  CL 104  CO2 27  GLUCOSE 154*  BUN 28*  CREATININE 0.97  CALCIUM 9.6    PT/INR: No results for input(s): LABPROT, INR in the last 72 hours. ABG    Component Value Date/Time   TCO2 26 03/01/2017 0258   CBG (last 3)  Recent Labs    09/26/19 1702 09/26/19 2106 09/27/19 0612  GLUCAP 109* 125* 151*    Assessment/Plan:  -POD 27incision and drainage of left [place] joint abscess. Willdiscontinue the wound vac and proceed with saline moist to dry dressings twice daily.  Will continue to follow closely.  ABX forMSSA cultured from the woundper ID recs.     LOS: 12 days    [name], [place] [phone] 09/27/2019

## 2019-09-27 NOTE — Progress Notes (Signed)
Patient with drop in BS--note rise in BUN and has been on moderate SSI. Will change to sensitive scale as now more active. Also encouraged patient to increase fluid intake.

## 2019-09-27 NOTE — Progress Notes (Signed)
[place] PHYSICAL MEDICINE & REHABILITATION PROGRESS NOTE  Subjective/Complaints:   Pt reports anxiety has improved a little with new meds, earlier than expected. Not sighing like used to. Debrox helpful VAC d/c'd this AM by CT surgery.    ROS: Patient denies fever, rash, sore throat, blurred vision, nausea, vomiting, diarrhea, cough, shortness of breath or chest pain,  headache, or mood change.   Objective: Vital Signs: Blood pressure 119/68, pulse 68, temperature 98.6 F (37 C), resp. rate 18, height 6\' 1"  (1.854 m), weight 111.4 kg, SpO2 95 %. No results found. Recent Labs    09/27/19 0446  WBC 9.6  HGB 13.5  HCT 40.0  PLT 156   Recent Labs    09/27/19 0446  NA 136  K 4.3  CL 104  CO2 27  GLUCOSE 154*  BUN 28*  CREATININE 0.97  CALCIUM 9.6    Physical Exam: BP 119/68 (BP Location: Left Arm)   Pulse 68   Temp 98.6 F (37 C)   Resp 18   Ht 6\' 1"  (1.854 m)   Wt 111.4 kg   SpO2 95%   BMI 32.40 kg/m  Constitutional: No distress . Vital signs reviewed. Lying in bed; appears comfortable; VAC off L clavicular area- C/D/I HEENT: EOMI, oral membranes moist Neck: supple Cardiovascular: RRR without murmur.    Respiratory: CTA Bilaterally without wheezes or rales. Normal effort    GI: BS +, non-tender, non-distended  Skin: VAC off- C/D/I;  PICC in RUE Psych: pleasant Musc: Left hip and sternum TTP  Neurologic: Alert Motor: Right upper extremity: 4+/5 proximal to distal Left upper extremity: 4-/5 proximal to distal (some pain inhibition) Left lower extremity: 4/5 proximal to distal, stable Right lower extremity: 4-4+/5 proximal to distal (some pain inhibition), stable  Assessment/Plan: 1. Functional deficits secondary to right thalamic infarct with lumbar discitis which require 3+ hours per day of interdisciplinary therapy in a comprehensive inpatient rehab setting.  Physiatrist is providing close team supervision and 24 hour management of active medical  problems listed below.  Physiatrist and rehab team continue to assess barriers to discharge/monitor patient progress toward functional and medical goals  Care Tool:  Bathing  Bathing activity did not occur: Safety/medical concerns(Unable to tolerate activity/mobility 2/2 pain) Body parts bathed by patient: Right arm, Left arm, Chest, Abdomen, Front perineal area, Right upper leg, Left upper leg, Face   Body parts bathed by helper: Buttocks, Right lower leg, Left lower leg     Bathing assist Assist Level: Minimal Assistance - Patient > 75%     Upper Body Dressing/Undressing Upper body dressing   What is the patient wearing?: Pull over shirt    Upper body assist Assist Level: Minimal Assistance - Patient > 75%    Lower Body Dressing/Undressing Lower body dressing      What is the patient wearing?: Pants     Lower body assist Assist for lower body dressing: Independent     Toileting Toileting    Toileting assist Assist for toileting: Moderate Assistance - Patient 50 - 74% Assistive Device Comment: urinal   Transfers Chair/bed transfer  Transfers assist     Chair/bed transfer assist level: Minimal Assistance - Patient > 75%     Locomotion Ambulation   Ambulation assist      Assist level: Contact Guard/Touching assist Assistive device: Walker-rolling Max distance: 100'   Walk 10 feet activity   Assist     Assist level: Contact Guard/Touching assist Assistive device: Walker-rolling   Walk 50  feet activity   Assist    Assist level: Contact Guard/Touching assist Assistive device: Walker-rolling    Walk 150 feet activity   Assist Walk 150 feet activity did not occur: Safety/medical concerns         Walk 10 feet on uneven surface  activity   Assist Walk 10 feet on uneven surfaces activity did not occur: Safety/medical concerns         Wheelchair     Assist Will patient use wheelchair at discharge?: Yes Type of Wheelchair:  Manual    Wheelchair assist level: Supervision/Verbal cueing Max wheelchair distance: 150    Wheelchair 50 feet with 2 turns activity    Assist        Assist Level: Supervision/Verbal cueing   Wheelchair 150 feet activity     Assist Wheelchair 150 feet activity did not occur: Safety/medical concerns   Assist Level: Supervision/Verbal cueing    Medical Problem List and Plan: 1.  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 Again reviewed diskitis and potential effects on spine with pt today 2. Antithrombotics: -DVT/anticoagulation:Pharmaceutical:Pradexa -antiplatelet therapy: N/a 3. Pain Management:reasonable control  Flexeril changed to Robaxin 500 twice daily as needed  Added lidocaine patch to lumbar paraspinals.   Continue oxycodone prn   Added tramadol prn for moderate pain.  Monitor for nausea associated with Oxy/tramadol  9/21- will change Oxy to Dilaudid 2 mg q4 hours prn.  9/22- helping  9/23- taking 1-2x/day  9/24- changed lidoderm patches to 9am to 9pm 4. Mood:Team to provide ego support and encouragement.LCSW to follow for evaluation and support -antipsychotic agents: N/A 5. Neuropsych: This patientiscapable of making decisions on hisown behalf. 6. Skin/Wound Care:See #9 7. Fluids/Electrolytes/Nutrition:Monitor I/Os. Continue prostat supplement due to low protein store and to promote healing. 8. MSSA bacteremia: Naficin every 4 thorough 9/16due to concerns of meningitic spreadthen changed to Cefazolin thorough 10/13 9/28- verified would go HOME on ABX and would need assistance to hook them up, since nursing not theere every time. Pt understands. 9. Diskitis/left sternoclavicular infection: Wound VACdressing changesMWFper WOC. 10. Left biceps tendinosis/Mild-moderate  OA left shoulder: Follow up with ortho post discharge.  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 PAF with RVR: Monitor HR --continue Tikosyn and Pradexa.  Went into A. fib with RVR and spontaneously converted on 9/19 13. T2DM with hyperglycemia: Hgb A1C- 6.7and controlled on metformin BID PTA. Will monitor BS ac/hs with SSI for elevated BS.  CBG (last 3)  Recent Labs    09/26/19 1702 09/26/19 2106 09/27/19 0612  GLUCAP 109* 125* 151*     Metformin 500 daily started on 9/18  9/27--reasonable to borderline control---no changes today 14.Emphysema: Continue Breo. Currently needing supplemental oxygen prn per patient.  15. Hypokalemia/Hypomagnesemia: Continue K dur and Mag Ox  Potassium 4.1 on 9/19  Magnesium 1.8 on 9/17 16.  Hypoalbuminemia  Supplement initiated 17.  Nausea  Appears to be related to pain medications  Improving  9/21- changed oxy to dialudid 2mg  q4 hours prn 18.  Slightly labile blood pressure  Continue to monitor 19.  BUN/creatinine trending up  Encourage fluids 20. Anxiety/panic attacks  9/22- has Benzo however would like to prevent these occurences will add Paxil 10 mg x 1 week QHS then increase to 20 mg QHS- since can cause sedation, will do at night. Has tolerated in past for depression.   -  Neuropsych has seen patient.  21. HOH with R ear wax  9/25-   Debrox x 4 days.  9/28- improved 22. Dispo- 9/23- will ask if can get H/H aide for pt  9/24- unable to due to pt's higher level of function.   9/28- wife and daughter in law coming in for family training    LOS: 12 days A FACE TO FACE EVALUATION WAS PERFORMED  [name] [name] 09/27/2019, 11:17 AM

## 2019-09-27 NOTE — Progress Notes (Signed)
Hypoglycemic Event  CBG: 68  Treatment: 4 oz juice/soda  Symptoms: None  Follow-up CBG: Time:1726 CBG Result: 162  Possible Reasons for Event: Inadequate meal intake  Comments/MD notified:Pamela Love notified.      Henny Strauch S

## 2019-09-27 NOTE — Progress Notes (Signed)
Occupational Therapy Session Note  Patient Details  Name: [name] [name]. MRN: [id number] Date of Birth: 1951/04/22  Today's Date: 09/27/2019 OT Individual Time: 1300-1400 OT Individual Time Calculation (min): 60 min    Short Term Goals: Week 2:  OT Short Term Goal 1 (Week 2): STG=LTG Due to LOS  Skilled Therapeutic Interventions/Progress Updates:    Pt seen for OT session focusing on functional mobility and IADL re-training. Pt in supine upon arrival, agreeable to tx session. Reports being up to date on pain meds, repositioned for comfort throughout session.  HE transferred to sitting EOB with supervision using hospital bed features with increased time. Requires B UE support for sitting EOB to relieve pressure on back. Sit>stand with CGA and ambulated ~50ft to w/c. HE self propelled w/c to ADL apartment using B LEs. Completed bed mobility on standard bed, using leg lifter to assist with  Management of LEs. Completed EOB<> supine with supervision/VCs and increased time.  Transitioned back to w/c. Completed w/c mobility within kitchen, using LEs to manage w/c in functional context. Practiced retrieving items from refrigerator from w/c level and then standing at sink to obtain items. Discussed kitchen set-up modifications to allow for increased accessibility from w/c level. Throughout session, education/discussion regarding modified ADLs/IADLs from w/c level when needed 2/2 pain or fatigue and using RW when able or some combination during ADLs.  Pt returned to room at end of session, transitioned back to bed with CGA. Pt left in supine with all needs in reach and bed alarm on.   Therapy Documentation Precautions:  Precautions Precautions: Fall Precaution Comments: wound vac L [place] joint  Required Braces or Orthoses: Spinal Brace Spinal Brace: Lumbar corset, Applied in sitting position Restrictions Weight Bearing Restrictions: No Other Position/Activity Restrictions: wound  vac   Therapy/Group: Individual Therapy  Aydan Phoenix L 09/27/2019, 7:09 AM

## 2019-09-27 NOTE — Progress Notes (Signed)
Patient paced himself on home CAP unit for the night

## 2019-09-27 NOTE — Progress Notes (Signed)
Nutrition Follow-up  RD working remotely.  DOCUMENTATION CODES:   Obesity unspecified, Non-severe (moderate) malnutrition in context of acute illness/injury  INTERVENTION:   - Continue MVI with minerals daily  - Continue Pro-stat 30 ml BID, each supplement provides 100 kcal and 15 grams of protein  - Continue double protein portions TID with meals  - d/c Magic Cup  NUTRITION DIAGNOSIS:   Moderate Malnutrition related to acute illness (septic left [place] joint s/p I&D, osteomyelitis) as evidenced by mild muscle depletion, percent weight loss (6.6% weight loss in less than 2 months).  Ongoing, being addressed via oral nutrition supplements  GOAL:   Patient will meet greater than or equal to 90% of their needs  Progressing  MONITOR:   PO intake, Supplement acceptance, Labs, Weight trends, Skin  REASON FOR ASSESSMENT:   Malnutrition Screening Tool    ASSESSMENT:   68 year old male with PMH of CAD, CAF, T2DM, OSA on CPAP, prior lumbar decompression, recent diagnosis of thoracic aneurysm, NAION right eye with recent admission 8/5-08/05/19 to [facility] for left optic nerve inflammation which was found on routine follow up. Pt developed RLE cellulitis post discharge which resolved with doxycyline but he started developing progressive back pain with poor PO intake, inability to walk as well as bilateral shoulder pain. Pt was admitted for work-up on 08/27/19 and found to be dehydrated, had leucocytosis and elevated CRP. Neurology consulted for input and recommended MRI brain as well as spine to rule out infection vs NMO/MS vs PMR vs siff person syndrome. CTA head neck was negative for evidence of CSF vasculitis and showed 50% stenosis left SA origin and 30-50% stenosis R-VA V4 segment. MRI brain showed punctate infarct in right thalamus and in splenium corpus callosum question micro embolic from posterior circulation. MRI cervical. Thoracic and lumbar spine showed moderate to sever lumbar  foraminal stenosis with bone marrow signal changes in L4/5 with mild paravertebral inflammatory changes and cervical spondylosis with canal narrowing C4/5. He developed fevers and blood cultures done positive for MSSA. MRI sternum ordered due to swelling and showed septic left [place] joint with early osteomyelitis involving distal clavicle and significant soft tissue stranding with edema and left pectoralis muscular edema. Pt taken to OR 09/01/19 for I&D left [place] joint with application of wound VAC. 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 Pt admitted to CIR on 9/16.  9/28 - wound VAC removed  Noted target d/c date of 10/03.  Spoke with pt via phone call to room. Pt in good spirits today and states that overall he is doing much better. Pt is looking forward to d/c on Saturday.  Pt reports that his appetite is improving daily. Pt states he hasn't missed a dose of Pro-stat. Pt reports he is not eating the Magic Cups due to disliking them.  Pt is grateful for double protein portions. Pt states the food is very good and he really enjoyed the chicken parm he had recently.  Weight up 7 lbs since last RD visit. Pt reports that he thinks he has gained some muscle mass. Per RN edema assessment, pt with non-pitting edema to BLE.  Meal Completion: 50-100% x last 8 recorded meals (averaging 75%)  Medications reviewed and include: Pro-stat 30 ml BID, SSI, magnesium oxide, Metformin, MVI with minerals, Protonix, K-dur 20 mEq BID, Senna, IVabx  Labs reviewed. CBG's: 109-152 x 24 hours  UOP:  1200 ml x 24 hours  Diet Order:   Diet Order            Diet Carb Modified Fluid consistency: Thin; Room service appropriate? Yes  Diet effective now              EDUCATION NEEDS:   Education needs have been addressed  Skin:  Skin Assessment: Skin Integrity Issues: Skin Integrity  Issues: Incisions: sternum (wound VAC removed 9/28) Other: MASD to buttocks  Last BM:  09/25/19  Height:   Ht Readings from Last 1 Encounters:  09/22/19 6\' 1"  (1.854 m)    Weight:   Wt Readings from Last 1 Encounters:  09/26/19 111.4 kg    Ideal Body Weight:  83.6 kg  BMI:  Body mass index is 32.4 kg/m.  Estimated Nutritional Needs:   Kcal:  [id number]  Protein:  125-140 grams  Fluid:  >/= 2.2 L    Gaynell Face, MS, RD, LDN Inpatient Clinical Dietitian Pager: [phone] Weekend/After Hours: [phone]

## 2019-09-27 NOTE — Progress Notes (Signed)
Physical Therapy Session Note  Patient Details  Name: [name] [name]. MRN: [id number] Date of Birth: March 23, 1951  Today's Date: 09/27/2019 PT Individual Time: 1045-1200 PT Individual Time Calculation (min): 75 min   Short Term Goals: Week 2:  PT Short Term Goal 1 (Week 2): =LTG due to ELOS  Skilled Therapeutic Interventions/Progress Updates:    Pt received supine in bed, agreeable to PT session. No complaints of pain at rest, does have onset of L low back muscle spasms with mobility. Pt's spasms decrease with seated rest break. Supine to sit with Supervision with increased time and use of bedrail. Recommending pt purchase bedrail for use at home as he continues to rely on it for rolling in bed and supine to/from sit. Will provide pt with handout for where to purchase bedrail. Pt is unable to don LSO while seated EOB due to back pain/spasms and requires support of BUE while seated EOB, dependent to don brace. Sit to stand with CGA to RW. Stand pivot transfer bed to w/c with CGA and RW. Pt is able to adjust LSO with use of BUE once seated in w/c with back and lateral support. Ascend/descend 2 x 6" stairs laterally with R handrail and min A. Ascend/descend one 4" step with RW and min A. Pt does well maneuvering RW up/down step to simulate home environment. Ambulation x 100 ft with RW and CGA. Pt left seated in w/c in room with needs in reach at end of session.  Therapy Documentation Precautions:  Precautions Precautions: Fall Precaution Comments: wound vac L [place] joint  Required Braces or Orthoses: Spinal Brace Spinal Brace: Lumbar corset, Applied in sitting position Restrictions Weight Bearing Restrictions: No Other Position/Activity Restrictions: wound vac    Therapy/Group: Individual Therapy   Excell Seltzer, PT, DPT  09/27/2019, 12:59 PM

## 2019-09-27 NOTE — Progress Notes (Addendum)
Physical Therapy Session Note  Patient Details  Name: [name] [name]. MRN: [id number] Date of Birth: April 20, 1951  Today's Date: 09/27/2019 PT Individual Time: 1530-1620 PT Individual Time Calculation (min): 50 min   Short Term Goals: Week 2:  PT Short Term Goal 1 (Week 2): =LTG due to ELOS      Skilled Therapeutic Interventions/Progress Updates:   Pt resting in bed.  He rated pain 6/10, low back.  Chest wound vac has been removed.  Rolling R with bed features.  Pt sat up with good technique with supervision.  Total assistance for donning LSO due to difficulty lifting hands from bed, increasing back pain.  Sit> stand from raised bed with CGA to RW; movement ballistic.  Pt reported that he has a harder time pushing up on soft bed.    W/c propulsion using bil LEs to from room for bil LE strengthening, distant supervision. .  Gait training with  RW x 50' through obstacle course of cones, min assist and cues for wider BOS.  Advanced gait L><R with RW x 10' each, min assist.   Sit> stand from w/c with close supervision, pivot to bed with Rw.  Pt used leg lifter and crossed LEs at ankle, sit> lying iwht supervision.  At end of session , pt resting in bed with alarm set and needs at hand.     Therapy Documentation Precautions:  Precautions Precautions: Fall Precaution Comments: wound vac L  joint  Required Braces or Orthoses: Spinal Brace Spinal Brace: Lumbar corset, Applied in sitting position Restrictions Weight Bearing Restrictions: No Other Position/Activity Restrictions: wound vac    Pain: 6/10 back; medicated and using K pad in bed        Therapy/Group: Individual Therapy  Shonta Bourque 09/27/2019, 4:45 PM

## 2019-09-28 ENCOUNTER — Inpatient Hospital Stay (HOSPITAL_COMMUNITY): Payer: Medicare Other | Admitting: Physical Therapy

## 2019-09-28 ENCOUNTER — Inpatient Hospital Stay (HOSPITAL_COMMUNITY): Payer: Medicare Other | Admitting: Occupational Therapy

## 2019-09-28 ENCOUNTER — Inpatient Hospital Stay (HOSPITAL_COMMUNITY): Payer: Medicare Other

## 2019-09-28 LAB — GLUCOSE, CAPILLARY
Glucose-Capillary: 146 mg/dL — ABNORMAL HIGH (ref 70–99)
Glucose-Capillary: 144 mg/dL — ABNORMAL HIGH (ref 70–99)
Glucose-Capillary: 136 mg/dL — ABNORMAL HIGH (ref 70–99)
Glucose-Capillary: 142 mg/dL — ABNORMAL HIGH (ref 70–99)

## 2019-09-28 NOTE — Progress Notes (Signed)
Physical Therapy Session Note  Patient Details  Name: [name] [name]. MRN: [id number] Date of Birth: 1951-06-02  Today's Date: 09/28/2019 PT Individual Time: 0900-1015 PT Individual Time Calculation (min): 75 min   Short Term Goals: Week 2:  PT Short Term Goal 1 (Week 2): =LTG due to ELOS  Skilled Therapeutic Interventions/Progress Updates:    Pt received supine in bed, agreeable to PT session. Pt reports 8/10 pain in L low back, premedicated prior to start of therapy session. Supine to sit with Supervision with use of bedrail, increased time needed. Provided handout for where to purchase bed rail. Pt is unable to don LSO while seated EOB when initially getting up due to need for BUE support on side of bed to maintain balance. Pt is dependent to don LSO. Sit to stand with CGA to RW, stand pivot transfer bed to w/c with RW and CGA. Sit to stand with CGA from w/c. Ambulation x 50 ft with RW and CGA to Supervision before onset of fatigue. Car transfer with CGA at simulation of pt's car height at home, [employer]. Sit to stand 3 x 3 reps to RW from progressively lower mat table with Supervision to CGA. Ascend/descend 2 x 6" stairs laterally with R handrail, focus on management of UE from RW to/from stair handrail to simulate home environment. Toilet transfer with Supervision from [facility] with use of RW, setup A for clothing management. Sit to supine mod A for BLE management as pt has onset of low back spasms when attempting to use leg lifter. Pt left supine in bed with needs in reach at end of session.  Therapy Documentation Precautions:  Precautions Precautions: Fall Precaution Comments: wound vac L [place] joint  Required Braces or Orthoses: Spinal Brace Spinal Brace: Lumbar corset, Applied in sitting position Restrictions Weight Bearing Restrictions: No Other Position/Activity Restrictions: wound vac    Therapy/Group: Individual Therapy   Excell Seltzer, PT, DPT  09/28/2019, 12:17 PM

## 2019-09-28 NOTE — Progress Notes (Addendum)
  Subjective: Pt awake and alert, sitting up in the chair. He is pleased with his progress with PT.  RN reports left chest wound has had significant drainage since the wound vac was discontinued yesterday. The wound dressing was found to be saturated several times and had to be changed.   Objective: Vital signs in last 24 hours: Temp:  [97.7 F (36.5 C)-98.9 F (37.2 C)] 97.7 F (36.5 C) (09/29 0504) Pulse Rate:  [68-73] 69 (09/29 0504) Resp:  [16-18] 16 (09/29 0504) BP: (112-132)/(63-65) 132/65 (09/29 0504) SpO2:  [91 %-97 %] 93 % (09/29 0832) Weight:  [108.9 kg] 108.9 kg (09/29 0504)     Intake/Output from previous day: 09/28 0701 - 09/29 0700 In: 680 [P.O.:680] Out: 1700 [Urine:1700] Intake/Output this shift: Total I/O In: 240 [P.O.:240] Out: -   General appearance: alert, cooperative and no distress Wound: Wound packing removed. The wound is clean but is noted to have clear drainage. There is some devitalized fat in the base of the wound.   Lab Results: Recent Labs    09/27/19 0446  WBC 9.6  HGB 13.5  HCT 40.0  PLT 156   BMET:  Recent Labs    09/27/19 0446  NA 136  K 4.3  CL 104  CO2 27  GLUCOSE 154*  BUN 28*  CREATININE 0.97  CALCIUM 9.6    PT/INR: No results for input(s): LABPROT, INR in the last 72 hours. ABG    Component Value Date/Time   TCO2 26 03/01/2017 0258   CBG (last 3)  Recent Labs    09/27/19 1726 09/27/19 2121 09/28/19 0615  GLUCAP 162* 147* 146*    Assessment/Plan:   -POD28incision and drainage of left [place] joint abscess. The wound vac was discontinued yesterday but has required multiple dressing changes since then for management of copious clear drainage. Will resume negative pressure wound therapy. Suspect he will need this for a few more weeks.  Will continue to follow closely.   ABX forMSSA cultured from the woundper ID recs.   LOS: 13 days    [name], Hershal Coria [id number] 09/28/2019  I have seen  and examined [name]. and agree with the above assessment  and plan.  Grace Isaac MD Beeper [phone] Office [phone] 09/28/2019 12:10 PM

## 2019-09-28 NOTE — Consult Note (Addendum)
[place] Nurse wound consult note Consult requested to re-apply Vac dressing to chest wound; CT team is following for assessment and plan of care.  Refer to [facility] team previous notes, Vac was discontinued Mon and now will be replaced related to increased drainage from the location..  Wound is shallow and dressing is not complicated at this point; bedside nurses can change Q Tues/Thurs/Sat.  Called bedside nurse to confirm she is able to apply Vac dressing today and she agreed.  [place] team will not plan to follow; please re-consult if further assistance is needed.  Thank-you,  Julien Girt MSN, [place], [place], [place], [place]

## 2019-09-28 NOTE — Progress Notes (Signed)
Occupational Therapy Session Note  Patient Details  Name: [name] [name]. MRN: [id number] Date of Birth: 01-21-1951  Today's Date: 09/28/2019 OT Individual Time: 1300-1400 OT Individual Time Calculation (min): 60 min    Short Term Goals: Week 2:  OT Short Term Goal 1 (Week 2): STG=LTG Due to LOS  Skilled Therapeutic Interventions/Progress Updates:    Pt seen for OT session focusing on functional transfers and mobility. PT in supine upon arrival, voiced complaints of constipationad increased back tightness with mobility. RN made aware and administered pain medications at start of session.  Hospital bed set in flat orientation and raised to 27" in simulation of pt's home bed setting. Completed supine<>EOB with supervision and increased time use leg lifter to assist with bringing LEs up onto bed. VCs for mobility techniques.  Sit>stand from EOB with close supervision using RW. He declined ambulation due to back pain. Self propelled w/c throughout unit with supervision using B LEs. In ADL apartment, completed simulated tub/shower transfer utilizing tub bench. Completed with supervision and VCs for technique, increased time to manage LEs over tub wall.  Completed sit<>Stand from low soft surface recliner as he has at home. Min A for controlled descent to low surface and min-mod A to stand from low surface.  Pt returned to room propelling w/c with feet. Left seated in w/c with all needs in reach and RN present proving wound care.   Therapy Documentation Precautions:  Precautions Precautions: Fall Precaution Comments: wound vac L [place] joint  Required Braces or Orthoses: Spinal Brace Spinal Brace: Lumbar corset, Applied in sitting position Restrictions Weight Bearing Restrictions: No Other Position/Activity Restrictions: wound vac   Therapy/Group: Individual Therapy  Lucindia Lemley L 09/28/2019, 7:03 AM

## 2019-09-28 NOTE — Progress Notes (Signed)
[facility] PHYSICAL MEDICINE & REHABILITATION PROGRESS NOTE  Subjective/Complaints:   Pt reports wound VAC site/dressing was saturated 5-6x overnight and required to be changed.  Seen by CT surgery this AM- and it was determined wound VAC needs to go back on- per note, suggests will be for "a few more weeks"- so will likely need to go home with it on.  Had a BG yesterday of 68- resolved with juice, peanut butter crackers, etc, but thinks was due to 3 therapy sessions in a row.   ROS: Patient denies fever, rash, sore throat, blurred vision, nausea, vomiting, diarrhea, cough, shortness of breath or chest pain,  headache, or mood change.   Objective: Vital Signs: Blood pressure 132/65, pulse 69, temperature 97.7 F (36.5 C), temperature source Oral, resp. rate 16, height 6\' 1"  (1.854 m), weight 108.9 kg, SpO2 93 %. No results found. Recent Labs    09/27/19 0446  WBC 9.6  HGB 13.5  HCT 40.0  PLT 156   Recent Labs    09/27/19 0446  NA 136  K 4.3  CL 104  CO2 27  GLUCOSE 154*  BUN 28*  CREATININE 0.97  CALCIUM 9.6    Physical Exam: BP 132/65 (BP Location: Left Arm)   Pulse 69   Temp 97.7 F (36.5 C) (Oral)   Resp 16   Ht 6\' 1"  (1.854 m)   Wt 108.9 kg   SpO2 93%   BMI 31.67 kg/m  Constitutional: No distress . Vital signs reviewed. Sitting up in bedside chair; VAC being put back on L clavicle area due to copious drainage, bright affect, NAD HEENT: EOMI, oral membranes moist Neck: supple Cardiovascular: RRR without murmur.    Respiratory: CTA Bilaterally without wheezes or rales. Normal effort    GI: BS +, non-tender, non-distended  Skin: VAC being put back on;  PICC in RUE Psych: pleasant; no signs of anxiety today Musc: Left hip and sternum TTP  Neurologic: Alert Motor: Right upper extremity: 4+/5 proximal to distal Left upper extremity: 4-/5 proximal to distal (some pain inhibition) Left lower extremity: 4/5 proximal to distal, stable Right lower extremity:  4-4+/5 proximal to distal (some pain inhibition), stable  Assessment/Plan: 1. Functional deficits secondary to right thalamic infarct with lumbar discitis which require 3+ hours per day of interdisciplinary therapy in a comprehensive inpatient rehab setting.  Physiatrist is providing close team supervision and 24 hour management of active medical problems listed below.  Physiatrist and rehab team continue to assess barriers to discharge/monitor patient progress toward functional and medical goals  Care Tool:  Bathing  Bathing activity did not occur: Safety/medical concerns(Unable to tolerate activity/mobility 2/2 pain) Body parts bathed by patient: Right arm, Left arm, Chest, Abdomen, Front perineal area, Right upper leg, Left upper leg, Face   Body parts bathed by helper: Buttocks, Right lower leg, Left lower leg     Bathing assist Assist Level: Minimal Assistance - Patient > 75%     Upper Body Dressing/Undressing Upper body dressing   What is the patient wearing?: Pull over shirt    Upper body assist Assist Level: Minimal Assistance - Patient > 75%    Lower Body Dressing/Undressing Lower body dressing      What is the patient wearing?: Pants     Lower body assist Assist for lower body dressing: Independent     Toileting Toileting    Toileting assist Assist for toileting: Moderate Assistance - Patient 50 - 74% Assistive Device Comment: urinal   Transfers Chair/bed  transfer  Transfers assist     Chair/bed transfer assist level: Minimal Assistance - Patient > 75%     Locomotion Ambulation   Ambulation assist      Assist level: Minimal Assistance - Patient > 75% Assistive device: Walker-rolling Max distance: 50   Walk 10 feet activity   Assist     Assist level: Minimal Assistance - Patient > 75% Assistive device: Walker-rolling, Orthosis   Walk 50 feet activity   Assist    Assist level: Minimal Assistance - Patient > 75% Assistive device:  Orthosis, Walker-rolling    Walk 150 feet activity   Assist Walk 150 feet activity did not occur: Safety/medical concerns         Walk 10 feet on uneven surface  activity   Assist Walk 10 feet on uneven surfaces activity did not occur: Safety/medical concerns         Wheelchair     Assist Will patient use wheelchair at discharge?: Yes Type of Wheelchair: Manual    Wheelchair assist level: Supervision/Verbal cueing Max wheelchair distance: 150    Wheelchair 50 feet with 2 turns activity    Assist        Assist Level: Supervision/Verbal cueing   Wheelchair 150 feet activity     Assist Wheelchair 150 feet activity did not occur: Safety/medical concerns   Assist Level: Supervision/Verbal cueing    Medical Problem List and Plan: 1.  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 Again reviewed diskitis and potential effects on spine with pt today 2. Antithrombotics: -DVT/anticoagulation:Pharmaceutical:Pradexa -antiplatelet therapy: N/a 3. Pain Management:reasonable control  Flexeril changed to Robaxin 500 twice daily as needed  Added lidocaine patch to lumbar paraspinals.   Continue oxycodone prn   Added tramadol prn for moderate pain.  Monitor for nausea associated with Oxy/tramadol  9/21- will change Oxy to Dilaudid 2 mg q4 hours prn.  9/22- helping  9/23- taking 1-2x/day  9/24- changed lidoderm patches to 9am to 9pm 4. Mood:Team to provide ego support and encouragement.LCSW to follow for evaluation and support -antipsychotic agents: N/A 5. Neuropsych: This patientiscapable of making decisions on hisown behalf. 6. Skin/Wound Care:See #9 7. Fluids/Electrolytes/Nutrition:Monitor I/Os. Continue prostat supplement due to low protein store and to promote healing. 8.  MSSA bacteremia: Naficin every 4 thorough 9/16due to concerns of meningitic spreadthen changed to Cefazolin thorough 10/13 9/28- verified would go HOME on ABX and would need assistance to hook them up, since nursing not theere every time. Pt understands. 9. Diskitis/left sternoclavicular infection: Wound VACdressing changesMWFper WOC. 10. Left biceps tendinosis/Mild-moderate OA left shoulder: Follow up with ortho post discharge.  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 PAF with RVR: Monitor HR --continue Tikosyn and Pradexa.  Went into A. fib with RVR and spontaneously converted on 9/19 13. T2DM with hyperglycemia: Hgb A1C- 6.7and controlled on metformin BID PTA. Will monitor BS ac/hs with SSI for elevated BS.  CBG (last 3)  Recent Labs    09/27/19 1726 09/27/19 2121 09/28/19 0615  GLUCAP 162* 147* 146*     Metformin 500 daily started on 9/18  9/27--reasonable to borderline control---no changes today 14.Emphysema: Continue Breo. Currently needing supplemental oxygen prn per patient.  15. Hypokalemia/Hypomagnesemia: Continue K dur and Mag Ox  Potassium 4.1 on 9/19  Magnesium 1.8 on 9/17 16.  Hypoalbuminemia  Supplement initiated 17.  Nausea  Appears to be related to pain medications  Improving  9/21- changed oxy to dialudid 2mg  q4 hours prn 18.  Slightly labile blood pressure  Continue to monitor 19.  BUN/creatinine trending up  Encourage fluids 20. Anxiety/panic attacks  9/22- has Benzo however would like to prevent these occurences will add Paxil 10 mg x 1 week QHS then increase to 20 mg QHS- since can cause sedation, will do at night. Has tolerated in past for depression.   -Neuropsych has seen patient.  21. HOH with R ear wax  9/25-   Debrox x 4 days.  9/28- improved  22. Wound VAC  9/29- was put back on this AM per CT surgery- will likely need for a few MORE weeks.  23. Dispo- 9/23- will ask if can get H/H aide for pt  9/24- unable to due  to pt's higher level of function.   9/28- wife and daughter in law coming in for family training    LOS: 13 days A FACE TO FACE EVALUATION WAS PERFORMED  Cathlyn Tersigni 09/28/2019, 9:29 AM

## 2019-09-28 NOTE — Progress Notes (Signed)
Patient has home CPAP set up at bedside. Patient is able to place himself on/off when ready.  RT will monitor as needed.

## 2019-09-28 NOTE — Progress Notes (Signed)
Occupational Therapy Session Note  Patient Details  Name: [name] [name]. MRN: [id number] Date of Birth: June 14, 1951  Today's Date: 09/28/2019 OT Individual Time: 0700-0800 OT Individual Time Calculation (min): 60 min    Short Term Goals: Week 1:  OT Short Term Goal 1 (Week 1): Pt will complete functoinal transfers with min A overall using LRAD OT Short Term Goal 1 - Progress (Week 1): Met OT Short Term Goal 2 (Week 1): Pt will don pants with min A sit>stand using AE PRN OT Short Term Goal 2 - Progress (Week 1): Met OT Short Term Goal 3 (Week 1): Pt will stand to complete 1 grooming task with steadying assist in order to increase functional standing balance/endurance OT Short Term Goal 3 - Progress (Week 1): Met OT Short Term Goal 4 (Week 1): Pt will don shirt with supervision/set-up OT Short Term Goal 4 - Progress (Week 1): Met  Skilled Therapeutic Interventions/Progress Updates:    1:1. Pt received in bed agreeable to OT tx. Spasm in back 8/10 pain after mobility with heat and rest provided. Pt supine>sitting EOB with A don brace EOB. Pt ambulates with MIN A and RW use with VC for pushing off from EOB. Pt bathes buttocks with toilet tongs with CGA and VC for mini squat to access buttocks. Pt completes donning shoes with s/u. Pt completes 3x5 mini squats with blue resistance band around knees for improved glute activation/strengthening required for power up during functional sit to stand with CGA. Pt groom at sink back in room with set up. Exited session with pt setaed in w/c, call light in reach and all needs met  Therapy Documentation Precautions:  Precautions Precautions: Fall Precaution Comments: wound vac L [place] joint  Required Braces or Orthoses: Spinal Brace Spinal Brace: Lumbar corset, Applied in sitting position Restrictions Weight Bearing Restrictions: No Other Position/Activity Restrictions: wound vac General:   Vital Signs: Therapy Vitals Temp: 97.7 F (36.5  C) Temp Source: Oral Pulse Rate: 69 Resp: 16 BP: 132/65 Patient Position (if appropriate): Lying Oxygen Therapy SpO2: 91 % O2 Device: Room Air Pain:   ADL:   Vision   Perception    Praxis   Exercises:   Other Treatments:     Therapy/Group: Individual Therapy  Tonny Branch 09/28/2019, 7:12 AM

## 2019-09-29 ENCOUNTER — Inpatient Hospital Stay (HOSPITAL_COMMUNITY): Payer: Medicare Other | Admitting: *Deleted

## 2019-09-29 ENCOUNTER — Inpatient Hospital Stay (HOSPITAL_COMMUNITY): Payer: Medicare Other | Admitting: Occupational Therapy

## 2019-09-29 ENCOUNTER — Inpatient Hospital Stay (HOSPITAL_COMMUNITY): Payer: Medicare Other | Admitting: Physical Therapy

## 2019-09-29 LAB — GLUCOSE, CAPILLARY
Glucose-Capillary: 153 mg/dL — ABNORMAL HIGH (ref 70–99)
Glucose-Capillary: 138 mg/dL — ABNORMAL HIGH (ref 70–99)
Glucose-Capillary: 120 mg/dL — ABNORMAL HIGH (ref 70–99)
Glucose-Capillary: 135 mg/dL — ABNORMAL HIGH (ref 70–99)

## 2019-09-29 MED ORDER — METFORMIN HCL 500 MG PO TABS
250.0000 mg | ORAL_TABLET | Freq: Three times a day (TID) | ORAL | Status: DC
  Administered 2019-09-29 – 2019-10-02 (×8): 250 mg via ORAL
  Filled 2019-09-29 (×8): qty 1

## 2019-09-29 MED ORDER — CEFAZOLIN IV (FOR PTA / DISCHARGE USE ONLY): 2.0000 g | Freq: Three times a day (TID) | INTRAVENOUS | 1 refills | Status: DC

## 2019-09-29 NOTE — Progress Notes (Signed)
Patient placed himself on home CPAP unit for the night with oxygen set at 2lpm.  

## 2019-09-29 NOTE — Progress Notes (Signed)
Recreational Therapy Assessment and Plan  Patient Details  Name: Nikolaj Geraghty. MRN: [id number] Date of Birth: 14-Nov-1951 Today's Date: 09/29/2019  Rehab Potential:  Good ELOS:  discharge 10/3   Assessment  Problem List:      Patient Active Problem List   Diagnosis Date Noted  . Right thalamic infarction ([place])   . Hypoalbuminemia due to protein-calorie malnutrition ([place])   . Hypokalemia   . Hypomagnesemia   . Diabetes mellitus type 2 in obese ([place])   . Diskitis 09/15/2019  . Suspected infectious meningitis 09/01/2019  . MSSA bacteremia 08/30/2019  . Discitis of lumbar region 08/30/2019  . Left shoulder pain 08/30/2019  . Septic arthritis of sternoclavicular joint, left ([place]) 08/30/2019  . Myelopathy ([place]) 08/27/2019  . H/O optic neuritis 08/27/2019  . Acute back pain with sciatica, right 09/04/2017  . Right hip pain 09/04/2017  . Thrombocytopenia ([place]) 06/25/2016  . Atrial fibrillation ([place]) 01/01/2016  . 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 Iliac aneurysm ([place])   . Morbid obesity ([place]) 07/08/2014  . Type 2 diabetes mellitus with other circulatory complications ([place]) 76/72/0947  . Chronic anticoagulation 12/21/2013  . Sleep disorder 06/21/2013  . Decreased hearing 08/06/2012  . Hemorrhoids 08/06/2012  . High risk medication use 05/05/2012  . Anticoagulation management encounter 05/05/2012  . Retinal tear 04/29/2012  . Personal history of colonic polyps 05/05/2011  . Visit for preventive health examination 05/05/2011  . PALPITATIONS 08/16/2010  . LIVER FUNCTION TESTS, ABNORMAL 04/25/2010  . Lateral epicondylitis 10/24/2009  . TOBACCO USE, QUIT 10/24/2009  . OBESITY, UNSPECIFIED 04/24/2009  . Hyperlipidemia 03/22/2009  . ERECTILE DYSFUNCTION 03/22/2009  . Obstructive sleep apnea 03/22/2009  . 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 MYOCARDIAL INFARCTION, HX OF 03/22/2009  . Coronary atherosclerosis 03/22/2009  . Atrial fibrillation with RVR ([place])  03/22/2009  . GERD 03/22/2009    Past Medical History:      Past Medical History:  Diagnosis Date  . Atrial fibrillation ([place]) 03/22/2009   a. s/p multiple DCCV; b. no coumadin due to low TE risk profile; c. Tikosyn Rx  . Coronary atherosclerosis of native coronary artery 11/2002   a. s/p stent to LAD 12/03; OM2 occluded at cath 12/03; d. myoview 5/10: no ischemia;  e. echo 7/11: EF 55%, BAE, mild RVE, PASP 41-45; Myoview was in March 2013. There was no ischemia or infarction, EF 51%   . Cutaneous abscess of back excluding buttocks 07/04/2014   Appears to stem from possibly a cyst very large area 6 cm contact surgeon office   . Diabetes mellitus without complication ([place])   . Drusen body    see opth note  . ERECTILE DYSFUNCTION 03/22/2009  . GERD 03/22/2009  . HYPERGLYCEMIA 04/25/2010  . HYPERLIPIDEMIA 03/22/2009  . 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 LATERAL EPICONDYLITIS, LEFT 10/24/2009  . LIVER FUNCTION TESTS, ABNORMAL 04/25/2010  . Local reaction to immunization 05/05/2012   minor resolving  zostavax   . Myocardial infarction ([place]) mi2003  . Numbness in left leg    foot related to back disease and surgery  . Obesity, unspecified 04/24/2009  . Perforated appendicitis with necrosis s/p open appendectomy 06/07/14 06/04/2014  . Renal cyst    Characterized by MRI as simple  . Ruptured suppurative appendicitis    2015   . SLEEP APNEA, OBSTRUCTIVE 03/22/2009   compliant with CPAP  . THROMBOCYTOPENIA 08/16/2010  . TOBACCO USE, QUIT 10/24/2009  .  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 Umbilical hernia    Past Surgical History:       Past Surgical History:  Procedure Laterality Date  . APPENDECTOMY N/A 06/06/2014   Procedure: APPENDECTOMY;  Surgeon: Odis Hollingshead, MD;  Location: [facility];  Service: General;  Laterality: N/A;  . COLON SURGERY    . COLONOSCOPY  11/15/2011   Procedure: COLONOSCOPY;  Surgeon: Juanita Craver, MD;  Location:  [facility];  Service: Endoscopy;  Laterality: N/A;  . COLONOSCOPY WITH PROPOFOL N/A 01/03/2017   Procedure: COLONOSCOPY WITH PROPOFOL;  Surgeon: Carol Ada, MD;  Location: [facility];  Service: Endoscopy;  Laterality: N/A;  . cyst on epiglottis  08/2002  . ESOPHAGOGASTRODUODENOSCOPY  11/15/2011   Procedure: ESOPHAGOGASTRODUODENOSCOPY (EGD);  Surgeon: Juanita Craver, MD;  Location: [facility];  Service: Endoscopy;  Laterality: N/A;  . LAPAROSCOPIC APPENDECTOMY N/A 06/06/2014   Procedure: APPENDECTOMY LAPAROSCOPIC attemted;  Surgeon: Odis Hollingshead, MD;  Location: [facility];  Service: General;  Laterality: N/A;  . LUMBAR DISC SURGERY     two  holes in spinalcovering  . stent     LAD DUS 2004  . STERNAL WOUND DEBRIDEMENT Left 08/31/2019   Procedure: LEFT STERNOCLAVICULAR WOUND DEBRIDEMENT WITH APPLICATION OF WOUND VAC;  Surgeon: Grace Isaac, MD;  Location: [facility];  Service: Thoracic;  Laterality: Left;  . surgery l4-l5   1998   ruptured x 3  . ulnar neuropathy    . 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 He wastreated with IV steroidsovernightand dischargedto homeon taper. He developed RLE cellulitis post dischargewhich resolved withdoxycylinebut he started developing progressive back pain with poor po intake, inability to walk as well as bilateral shoulder pain (bilateral clavicle to chest wall and up his neck with inability to move BUE. He was admitted for work up on 08/27/19 and found to be dehydrated with with BUN 36, had leucocytosis with WBC 12.0 and elevated CRP-1.3. Neurology consulted for input and recommended MRI brain as  well as spine to rule out infection v/s NMO/MS v/s PMR v/s siff person syndrome. CTA head neck was negative for evidence of CSF vasculitis and showed 50% stenosis left SA origin and 30-50% stenosis R-VA V4 segment. MRI brain showed punctate infarct in right thalamus and in splenium corpus callosum question micro embolic from posterior circulation. MRI cervical. Thoracic and lumbar spine showed moderate to sever lumbar foraminal stenosis with bone marrow signal changes in L4/5 with mild paravertebral inflammatory changes and cervical spondylosis with canal narrowing C4/5. He developed fevers and blood cultures done positive for MSSA. MRI sternum ordered due to swelling and showed septic left [place] joint with early osteomyelitis involving distal clavicle and significant soft tissue stranding with edema and left pectoralis muscular edema.   Dr. Servando Snare consulted and patient taken to OR 09/01/19 for I &D left [place] joint with application of wound VAC. Dr. Leonel Ramsay felt that diffuse weakness was improving and most likely felt to be due to pain from diskitis/osteomyelitis but recommended meningitic dosage for coverage of MSSA bacteremia due to possibility for spread to meningitis. ID (Dr.Comer) following for input and recommended Nafcillin X 14 days with change to cefazolin 9/17-->10/12/19. Left  shoulder MRI ordered due to ongoing shoulder pain --was negative for osteo but showed RTC and bicpes tendinosis with mild to moderate OA of AC and glenohumeral joints. Reactive leucocytosis and thrombocytopenia is resolving. Hypokalemia and hypomagnesemia has resolved with supplementation.  Patient transferred to CIR on 09/15/2019 .   Met with pt today per team request to discussed relaxation training.  Pt shared his stress/concerns in regards to his health and providing care for his wife.  Discussed relaxation training including quick tension releasers, diaphragmatic breathing and provided handouts.  Pt stated  understanding.  Also discussed activity analysis with potential modifications and provided handout for this as well.    Plan  No further TR as pt is discharging home 10/3.  Recommendations for other services: None  Discharge Criteria: Patient will be discharged from TR if patient refuses treatment 3 consecutive times without medical reason.  If treatment goals not met, if there is a change in medical status, if patient makes no progress towards goals or if patient is discharged from hospital.  The above assessment, treatment plan, treatment alternatives and goals were discussed and mutually agreed upon: by patient  [place] 09/29/2019, 4:18 PM

## 2019-09-29 NOTE — Progress Notes (Signed)
Occupational Therapy Session Note  Patient Details  Name: [name] [name]. MRN: [id number] Date of Birth: 10-02-1951  Today's Date: 09/29/2019 OT Individual Time: [id number] OT Individual Time Calculation (min): 75 min    Short Term Goals: Week 2:  OT Short Term Goal 1 (Week 2): STG=LTG Due to LOS  Skilled Therapeutic Interventions/Progress Updates:    Treatment session with focus on functional mobility in home environment at w/c level with homemaking tasks. Pt received supine in bed declining bathing/dressing.  Pt expressing desire to complete grooming tasks.  Completed bed mobility from flat bed with supervision.  Sit > stand to RW with CGA.  Pt ambulated to sink with RW with CGA.  Unable to complete grooming tasks in standing due to heavy reliance on UB in standing, therefore completed grooming tasks in sitting.  Pt propelled w/c with BLE around unit and in ADL apt for BLE strengthening while engaging in home making tasks.  Pt completed mobility in kitchen to obtain items from various surfaces while discussing modification of setup at home to increase independence.  Engaged in simulated laundry task with pt able to retreive items from dryer as pt has front load washer and dryer at home.  Pt ambulated 100' with RW for strengthening and endurance.  Pt reports need to toilet, completed transfer with CGA and sit <> stand from [facility] over toilet for increased ease.  Pt able to complete hygiene with increased time and effort.  Returned to sitting upright in w/c with all needs in reach and nurse tech present in room.  Therapy Documentation Precautions:  Precautions Precautions: Fall Precaution Comments: wound vac L [place] joint  Required Braces or Orthoses: Spinal Brace Spinal Brace: Lumbar corset, Applied in sitting position Restrictions Weight Bearing Restrictions: No Other Position/Activity Restrictions: wound vac Pain: Pain Assessment Pain Scale: 0-10 Pain Score: 5 Pain Intervention(s):  Medication (See eMAR);Emotional support   Therapy/Group: Individual Therapy  Simonne Come 09/29/2019, 9:37 AM

## 2019-09-29 NOTE — Progress Notes (Signed)
PHARMACY CONSULT NOTE FOR:  OUTPATIENT  PARENTERAL ANTIBIOTIC THERAPY (OPAT)  Indication: MSSA bacteremia/discitits Regimen:  Cefazolin 2 gm every 8 hours  End date: 10/12/19  IV antibiotic discharge orders are pended. To discharging provider:  please sign these orders via discharge navigator,  Select New Orders & click on the button choice - Manage This Unsigned Work.     Thank you for allowing pharmacy to be a part of this patient's care.  Jimmy Footman, PharmD, BCPS, BCIDP Infectious Diseases Clinical Pharmacist Phone: [phone] 09/29/2019, 1:29 PM

## 2019-09-29 NOTE — Progress Notes (Signed)
Occupational Therapy Session Note  Patient Details  Name: [name] [name]. MRN: [id number] Date of Birth: 1951/05/19  Today's Date: 09/29/2019 OT Individual Time: 1340-1445 OT Individual Time Calculation (min): 65 min    Short Term Goals: Week 2:  OT Short Term Goal 1 (Week 2): STG=LTG Due to LOS  Skilled Therapeutic Interventions/Progress Updates:    Pt seen for OT session focusing on functional mobility and UE strengthening. Pt in supine upon arrival with hand off from rec. Therapist. Pt reports being pre-medicated prior to tx session, however, required increased time and rest breaks throughout movement.  He transferred to sitting EOB with supervision and increased time. Completed sit<>stand and stand pivot transfers throughout session with supervision using RW with increased time. He ambulated ~3ft towards therapy gym before requiring seated rest break which he was able to self-initiate.  In therapy gym, transitioned to supine on mat, completed UE strengthening exercises in supine as follows,x2 sets of 10 using #5 dowel rod:  Chest press  Overhead press  Tricep extension  Pt returned to room in w/c at end of session. Transferred back to bed with supervision and increased time. Pt left in supine with all needs in reach and bed alarm on.      Therapy Documentation Precautions:  Precautions Precautions: Fall Precaution Comments: wound vac [name] joint  Required Braces or Orthoses: Spinal Brace Spinal Brace: Lumbar corset, Applied in sitting position Restrictions Weight Bearing Restrictions: No Other Position/Activity Restrictions: wound vac   Therapy/Group: Individual Therapy  Nathaniel Wakeley L 09/29/2019, 6:58 AM

## 2019-09-29 NOTE — Progress Notes (Signed)
Physical Therapy Session Note  Patient Details  Name: [name] [name]. MRN: [id number] Date of Birth: [birth year]  Today's Date: 09/29/2019 PT Individual Time: 1000-1100 PT Individual Time Calculation (min): 60 min   Short Term Goals: Week 2:  PT Short Term Goal 1 (Week 2): =LTG due to ELOS  Skilled Therapeutic Interventions/Progress Updates:    Pt received seated in w/c in room, agreeable to PT session. No complaints of pain at rest, does have intermittent back pain throughout session, not rated and declines intervention. Pt's wife and daughter-in-law present and able to stay for part of session for family education. Manual w/c propulsion x 150 ft with use of BLE and Supervision. Sit to stand with CGA to RW. Ascend/descend 2 stairs laterally with R handrail and CGA. Reviewed how to maneuver RW while pt performs stairs and sequencing of task. Pt's family demos good understanding of how to assist pt with RW management on stairs. Also demonstrated how to don/doff LSO as pt needs assist with this when initially getting out of bed. Will continue with family education tomorrow. Pt is able to ambulate x 150 ft with RW and Supervision with improved upright posture during gait. Pt is Supervision for toilet transfer with elevated BSC over toilet and use of RW. Pt has had some bowel smear, setup A for pericare and clothing management. Sit to/from supine on flat bed with use of leg lifter with Supervision. Pt left supine in bed with needs in reach, bed alarm in place at end of session.  Therapy Documentation Precautions:  Precautions Precautions: Fall Precaution Comments: wound vac L [place] joint  Required Braces or Orthoses: Spinal Brace Spinal Brace: Lumbar corset, Applied in sitting position Restrictions Weight Bearing Restrictions: No Other Position/Activity Restrictions: wound vac    Therapy/Group: Individual Therapy   Excell Seltzer, PT, DPT  09/29/2019, 12:38 PM

## 2019-09-29 NOTE — Progress Notes (Signed)
[facility] PHYSICAL MEDICINE & REHABILITATION PROGRESS NOTE  Subjective/Complaints:   Pt reports doing well today- no more low BGs- no issues.  ROS: Patient denies fever, rash, sore throat, blurred vision, nausea, vomiting, diarrhea, cough, shortness of breath or chest pain,  headache, or mood change.   Objective: Vital Signs: Blood pressure 124/66, pulse (!) 59, temperature 98.6 F (37 C), resp. rate 17, height 6\' 1"  (1.854 m), weight 113.4 kg, SpO2 94 %. No results found. Recent Labs    09/27/19 0446  WBC 9.6  HGB 13.5  HCT 40.0  PLT 156   Recent Labs    09/27/19 0446  NA 136  K 4.3  CL 104  CO2 27  GLUCOSE 154*  BUN 28*  CREATININE 0.97  CALCIUM 9.6    Physical Exam: BP 124/66 (BP Location: Left Arm)   Pulse (!) 59   Temp 98.6 F (37 C)   Resp 17   Ht 6\' 1"  (1.854 m)   Wt 113.4 kg   SpO2 94%   BMI 32.98 kg/m  Constitutional: No distress . Vital signs reviewed. Sitting up in bed; PT at bedside, NAD HEENT: EOMI, oral membranes moist Neck: supple Cardiovascular: RRR without murmur.    Respiratory: CTA Bilaterally without wheezes or rales. Normal effort    GI: BS +, non-tender, non-distended  Skin: VAC on;  PICC in RUE Psych: pleasant; no signs of anxiety today Musc: Left hip and sternum TTP  Neurologic: Alert Motor: Right upper extremity: 4+/5 proximal to distal Left upper extremity: 4-/5 proximal to distal (some pain inhibition) Left lower extremity: 4/5 proximal to distal, stable Right lower extremity: 4-4+/5 proximal to distal (some pain inhibition), stable  Assessment/Plan: 1. Functional deficits secondary to right thalamic infarct with lumbar discitis which require 3+ hours per day of interdisciplinary therapy in a comprehensive inpatient rehab setting.  Physiatrist is providing close team supervision and 24 hour management of active medical problems listed below.  Physiatrist and rehab team continue to assess barriers to discharge/monitor  patient progress toward functional and medical goals  Care Tool:  Bathing  Bathing activity did not occur: Safety/medical concerns(Unable to tolerate activity/mobility 2/2 pain) Body parts bathed by patient: Right arm, Left arm, Chest, Abdomen, Front perineal area, Right upper leg, Left upper leg, Face   Body parts bathed by helper: Buttocks, Right lower leg, Left lower leg     Bathing assist Assist Level: Minimal Assistance - Patient > 75%     Upper Body Dressing/Undressing Upper body dressing   What is the patient wearing?: Pull over shirt    Upper body assist Assist Level: Minimal Assistance - Patient > 75%    Lower Body Dressing/Undressing Lower body dressing      What is the patient wearing?: Pants     Lower body assist Assist for lower body dressing: Independent     Toileting Toileting    Toileting assist Assist for toileting: Minimal Assistance - Patient > 75% Assistive Device Comment: urinal   Transfers Chair/bed transfer  Transfers assist     Chair/bed transfer assist level: Supervision/Verbal cueing     Locomotion Ambulation   Ambulation assist      Assist level: Supervision/Verbal cueing Assistive device: Walker-rolling Max distance: 150'   Walk 10 feet activity   Assist     Assist level: Supervision/Verbal cueing Assistive device: Walker-rolling   Walk 50 feet activity   Assist    Assist level: Supervision/Verbal cueing Assistive device: Walker-rolling    Walk 150 feet activity  Assist Walk 150 feet activity did not occur: Safety/medical concerns  Assist level: Supervision/Verbal cueing Assistive device: Walker-rolling    Walk 10 feet on uneven surface  activity   Assist Walk 10 feet on uneven surfaces activity did not occur: Safety/medical concerns         Wheelchair     Assist Will patient use wheelchair at discharge?: Yes Type of Wheelchair: Manual    Wheelchair assist level: Supervision/Verbal  cueing Max wheelchair distance: 150    Wheelchair 50 feet with 2 turns activity    Assist        Assist Level: Supervision/Verbal cueing   Wheelchair 150 feet activity     Assist Wheelchair 150 feet activity did not occur: Safety/medical concerns   Assist Level: Supervision/Verbal cueing    Medical Problem List and Plan: 1.  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 Again reviewed diskitis and potential effects on spine with pt today 2. Antithrombotics: -DVT/anticoagulation:Pharmaceutical:Pradexa -antiplatelet therapy: N/a 3. Pain Management:reasonable control  Flexeril changed to Robaxin 500 twice daily as needed  Added lidocaine patch to lumbar paraspinals.   Continue oxycodone prn   Added tramadol prn for moderate pain.  Monitor for nausea associated with Oxy/tramadol  9/21- will change Oxy to Dilaudid 2 mg q4 hours prn.  9/22- helping  9/23- taking 1-2x/day  9/24- changed lidoderm patches to 9am to 9pm  9/30- no pain with VAC changes  4. Mood:Team to provide ego support and encouragement.LCSW to follow for evaluation and support -antipsychotic agents: N/A 5. Neuropsych: This patientiscapable of making decisions on hisown behalf. 6. Skin/Wound Care:See #9 7. Fluids/Electrolytes/Nutrition:Monitor I/Os. Continue prostat supplement due to low protein store and to promote healing. 8. MSSA bacteremia: Naficin every 4 thorough 9/16due to concerns of meningitic spreadthen changed to Cefazolin thorough 10/13 9/28- verified would go HOME on ABX and would need assistance to hook them up, since nursing not theere every time. Pt understands. 9. Diskitis/left sternoclavicular infection: Wound VACdressing changesMWFper WOC. 10. Left biceps tendinosis/Mild-moderate OA left shoulder:  Follow up with ortho post discharge.  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 PAF with RVR: Monitor HR --continue Tikosyn and Pradexa.  Went into A. fib with RVR and spontaneously converted on 9/19 13. T2DM with hyperglycemia: Hgb A1C- 6.7and controlled on metformin BID PTA. Will monitor BS ac/hs with SSI for elevated BS.  CBG (last 3)  Recent Labs    09/28/19 2057 09/29/19 0634 09/29/19 1142  GLUCAP 142* 153* 138*     Metformin 500 daily started on 9/18  9/27--reasonable to borderline control---no changes today 14.Emphysema: Continue Breo. Currently needing supplemental oxygen prn per patient.  15. Hypokalemia/Hypomagnesemia: Continue K dur and Mag Ox  Potassium 4.1 on 9/19  Magnesium 1.8 on 9/17 16.  Hypoalbuminemia  Supplement initiated 17.  Nausea  Appears to be related to pain medications  Improving  9/21- changed oxy to dialudid 2mg  q4 hours prn 18.  Slightly labile blood pressure  Continue to monitor 19.  BUN/creatinine trending up  Encourage fluids 20. Anxiety/panic attacks  9/22- has Benzo however would like to prevent these occurences will add Paxil 10 mg x 1 week QHS then increase to 20 mg QHS- since can cause sedation, will do at night. Has tolerated in past for depression.   -Neuropsych has seen patient.  21. HOH with R ear wax  9/25-   Debrox x 4 days.  9/28- improved  22. Wound VAC  9/29- was put back on this AM per CT surgery- will likely need for a few MORE weeks.  23. Dispo- 9/23- will ask if can get H/H aide for pt  9/24- unable to due to pt's higher level of function.   9/28- wife and daughter in law coming in for family training    LOS: 14 days A FACE TO [place] 09/29/2019, 1:45 PM

## 2019-09-29 NOTE — Plan of Care (Signed)
  Problem: Consults Goal: RH GENERAL PATIENT EDUCATION Description: See Patient Education module for education specifics. Outcome: Progressing Goal: Skin Care Protocol Initiated - if Braden Score 18 or less Description: If consults are not indicated, leave blank or document N/A Outcome: Progressing Goal: Nutrition Consult-if indicated Outcome: Progressing Goal: Diabetes Guidelines if Diabetic/Glucose > 140 Description: If diabetic or lab glucose is > 140 mg/dl - Initiate Diabetes/Hyperglycemia Guidelines & Document Interventions  Outcome: Progressing   Problem: RH SKIN INTEGRITY Goal: RH STG SKIN FREE OF INFECTION/BREAKDOWN Description: Patients skin will remain free from further infection or breakdown with min assist. Outcome: Progressing Goal: RH STG MAINTAIN SKIN INTEGRITY WITH ASSISTANCE Description: STG Maintain Skin Integrity With min Assistance. Outcome: Progressing Goal: RH STG ABLE TO PERFORM INCISION/WOUND CARE W/ASSISTANCE Description: STG Able To Perform Incision/Wound Care With total Assistance from caregiver. Outcome: Progressing   Problem: RH PAIN MANAGEMENT Goal: RH STG PAIN MANAGED AT OR BELOW PT'S PAIN GOAL Description: < 4 Outcome: Progressing

## 2019-09-30 ENCOUNTER — Ambulatory Visit (HOSPITAL_COMMUNITY): Payer: Medicare Other | Admitting: Physical Therapy

## 2019-09-30 ENCOUNTER — Encounter (HOSPITAL_COMMUNITY): Payer: Medicare Other | Admitting: Occupational Therapy

## 2019-09-30 ENCOUNTER — Inpatient Hospital Stay (HOSPITAL_COMMUNITY): Payer: Medicare Other | Admitting: Physical Therapy

## 2019-09-30 ENCOUNTER — Inpatient Hospital Stay (HOSPITAL_COMMUNITY): Payer: Medicare Other | Admitting: Occupational Therapy

## 2019-09-30 LAB — CBC WITH DIFFERENTIAL/PLATELET
Abs Immature Granulocytes: 0.03 10*3/uL (ref 0.00–0.07)
Basophils Absolute: 0.1 10*3/uL (ref 0.0–0.1)
Basophils Relative: 1 %
Eosinophils Absolute: 0.7 10*3/uL — ABNORMAL HIGH (ref 0.0–0.5)
Eosinophils Relative: 7 %
HCT: 38.3 % — ABNORMAL LOW (ref 39.0–52.0)
Hemoglobin: 13.1 g/dL (ref 13.0–17.0)
Immature Granulocytes: 0 %
Lymphocytes Relative: 23 %
Lymphs Abs: 2 10*3/uL (ref 0.7–4.0)
MCH: 32 pg (ref 26.0–34.0)
MCHC: 34.2 g/dL (ref 30.0–36.0)
MCV: 93.4 fL (ref 80.0–100.0)
Monocytes Absolute: 0.8 10*3/uL (ref 0.1–1.0)
Monocytes Relative: 9 %
Neutro Abs: 5.4 10*3/uL (ref 1.7–7.7)
Neutrophils Relative %: 60 %
Platelets: 159 10*3/uL (ref 150–400)
RBC: 4.1 MIL/uL — ABNORMAL LOW (ref 4.22–5.81)
RDW: 15.6 % — ABNORMAL HIGH (ref 11.5–15.5)
WBC: 8.8 10*3/uL (ref 4.0–10.5)
nRBC: 0 % (ref 0.0–0.2)

## 2019-09-30 LAB — COMPREHENSIVE METABOLIC PANEL
ALT: 25 U/L (ref 0–44)
AST: 38 U/L (ref 15–41)
Albumin: 2.9 g/dL — ABNORMAL LOW (ref 3.5–5.0)
Alkaline Phosphatase: 89 U/L (ref 38–126)
Anion gap: 8 (ref 5–15)
BUN: 28 mg/dL — ABNORMAL HIGH (ref 8–23)
CO2: 25 mmol/L (ref 22–32)
Calcium: 9.5 mg/dL (ref 8.9–10.3)
Chloride: 104 mmol/L (ref 98–111)
Creatinine, Ser: 0.86 mg/dL (ref 0.61–1.24)
GFR calc Af Amer: >60 mL/min (ref >60)
GFR calc non Af Amer: >60 mL/min (ref >60)
Glucose, Bld: 142 mg/dL — ABNORMAL HIGH (ref 70–99)
Potassium: 4.2 mmol/L (ref 3.5–5.1)
Sodium: 137 mmol/L (ref 135–145)
Total Bilirubin: 0.8 mg/dL (ref 0.3–1.2)
Total Protein: 6.3 g/dL — ABNORMAL LOW (ref 6.5–8.1)

## 2019-09-30 LAB — GLUCOSE, CAPILLARY
Glucose-Capillary: 138 mg/dL — ABNORMAL HIGH (ref 70–99)
Glucose-Capillary: 121 mg/dL — ABNORMAL HIGH (ref 70–99)
Glucose-Capillary: 109 mg/dL — ABNORMAL HIGH (ref 70–99)
Glucose-Capillary: 130 mg/dL — ABNORMAL HIGH (ref 70–99)

## 2019-09-30 LAB — FUNGAL ORGANISM REFLEX

## 2019-09-30 LAB — FUNGUS CULTURE WITH STAIN

## 2019-09-30 LAB — FUNGUS CULTURE RESULT

## 2019-09-30 NOTE — Progress Notes (Signed)
  Patient ID: [name] [name]., male   DOB: [birth year], 68 y.o.   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  A walker or cane will not resolve issue with performing activities of daily living.  A wheelchair will allow patient to safely perform daily activities.  Patient is not able to propel himself in the home using a standard weight wheelchair due to general weakness, arm weakness, and endurance.  Patient can self propel in the lightweight wheelchair.

## 2019-09-30 NOTE — Progress Notes (Signed)
Occupational Therapy Session Note  Patient Details  Name: [name] [name]. MRN: [id number] Date of Birth: 21-Nov-1951  Today's Date: 09/30/2019 OT Individual Time: 1400-1500 OT Individual Time Calculation (min): 60 min    Short Term Goals: Week 2:  OT Short Term Goal 1 (Week 2): STG=LTG Due to LOS  Skilled Therapeutic Interventions/Progress Updates:    Treatment session with focus on functional mobility in home environment.  Pt received supine in bed with reports of increased pain in back due to sitting up "too long", having just received pain meds and lidocaine patch.  Pt reports pleased with progress and recommendations from therapists regarding equipment and alternative methods to complete tasks.  Pt completed bed mobility with increased time and required assist to don LSO.  Engaged in sit > stand with pt reporting back "knotting up".  Engaged in sit > stand x3 from EOB to increase mobility.  Pt ambulated to w/c short distance with RW, then propelled w/c to ADL apt with BLE.  Engaged in furniture transfers as pt reporting typically sitting in recliner at home.  Discussed methods to increase height of seat and/or improve ability to stand from low height of recliner.  Pt ultimately required mod assist from therapist for sit > stand from recliner.  Ambulated back to room with RW with CGA.  Engaged in sit > stand again from lowered EOB to challenge ability from lower surface.  Pt returned to supine with use of leg lifter with supervision.  Therapy Documentation Precautions:  Precautions Precautions: Fall Precaution Comments: wound vac L [place] joint  Required Braces or Orthoses: Spinal Brace Spinal Brace: Lumbar corset, Applied in sitting position Restrictions Weight Bearing Restrictions: No Other Position/Activity Restrictions: wound vac General:   Vital Signs: Therapy Vitals Temp: (!) 97.5 F (36.4 C) Temp Source: Oral Pulse Rate: 79 Resp: 16 BP: (!) 101/59 Patient Position (if  appropriate): Sitting Oxygen Therapy SpO2: 96 % O2 Device: Room Air Pain:  Pt with c/o pain in lower back.  Premedicated.  Increased mobility   Therapy/Group: Individual Therapy  Simonne Come 09/30/2019, 3:29 PM

## 2019-09-30 NOTE — Progress Notes (Signed)
[facility] PHYSICAL MEDICINE & REHABILITATION PROGRESS NOTE  Subjective/Complaints:   Pt reports he's doing well- denies any issues- Per labs, has BUN of 28- needs to drink more- Cr is in normal limits.   ROS: Patient denies fever, rash, sore throat, blurred vision, nausea, vomiting, diarrhea, cough, shortness of breath or chest pain,  headache, or mood change.   Objective: Vital Signs: Blood pressure (!) 125/57, pulse (!) 59, temperature 98.5 F (36.9 C), resp. rate 18, height 6\' 1"  (1.854 m), weight 106.7 kg, SpO2 96 %. No results found. Recent Labs    09/30/19 0410  WBC 8.8  HGB 13.1  HCT 38.3*  PLT 159   Recent Labs    09/30/19 0410  NA 137  K 4.2  CL 104  CO2 25  GLUCOSE 142*  BUN 28*  CREATININE 0.86  CALCIUM 9.5    Physical Exam: BP (!) 125/57 (BP Location: Left Arm)   Pulse (!) 59   Temp 98.5 F (36.9 C)   Resp 18   Ht 6\' 1"  (1.854 m)   Wt 106.7 kg   SpO2 96%   BMI 31.03 kg/m  Constitutional: No distress . Vital signs and labs reviewed. Sitting up in bed;awake, alert, appropriate, nurse at bedside; in good spirits; NAD HEENT: EOMI, oral membranes moist Neck: supple Cardiovascular: RRR without murmur.    Respiratory: CTA Bilaterally without wheezes or rales. Normal effort    GI: BS +, non-tender, non-distended  Skin: VAC on;  PICC in RUE Psych: pleasant; no signs of anxiety today again Musc: Left hip and sternum TTP esp from wound VAC Neurologic: Alert Motor: Right upper extremity: 4+/5 proximal to distal Left upper extremity: 4-/5 proximal to distal (some pain inhibition) Left lower extremity: 4/5 proximal to distal, stable Right lower extremity: 4-4+/5 proximal to distal (some pain inhibition), stable  Assessment/Plan: 1. Functional deficits secondary to right thalamic infarct with lumbar discitis which require 3+ hours per day of interdisciplinary therapy in a comprehensive inpatient rehab setting.  Physiatrist is providing close team  supervision and 24 hour management of active medical problems listed below.  Physiatrist and rehab team continue to assess barriers to discharge/monitor patient progress toward functional and medical goals  Care Tool:  Bathing  Bathing activity did not occur: Safety/medical concerns(Unable to tolerate activity/mobility 2/2 pain) Body parts bathed by patient: Right arm, Left arm, Chest, Abdomen, Front perineal area, Right upper leg, Left upper leg, Face   Body parts bathed by helper: Buttocks, Right lower leg, Left lower leg     Bathing assist Assist Level: Minimal Assistance - Patient > 75%     Upper Body Dressing/Undressing Upper body dressing   What is the patient wearing?: Pull over shirt    Upper body assist Assist Level: Minimal Assistance - Patient > 75%    Lower Body Dressing/Undressing Lower body dressing      What is the patient wearing?: Pants     Lower body assist Assist for lower body dressing: Independent     Toileting Toileting    Toileting assist Assist for toileting: Minimal Assistance - Patient > 75% Assistive Device Comment: urinal   Transfers Chair/bed transfer  Transfers assist     Chair/bed transfer assist level: Supervision/Verbal cueing     Locomotion Ambulation   Ambulation assist      Assist level: Supervision/Verbal cueing Assistive device: Walker-rolling Max distance: 150'   Walk 10 feet activity   Assist     Assist level: Supervision/Verbal cueing Assistive device: Walker-rolling  Walk 50 feet activity   Assist    Assist level: Supervision/Verbal cueing Assistive device: Walker-rolling    Walk 150 feet activity   Assist Walk 150 feet activity did not occur: Safety/medical concerns  Assist level: Supervision/Verbal cueing Assistive device: Walker-rolling    Walk 10 feet on uneven surface  activity   Assist Walk 10 feet on uneven surfaces activity did not occur: Safety/medical concerns          Wheelchair     Assist Will patient use wheelchair at discharge?: Yes Type of Wheelchair: Manual    Wheelchair assist level: Supervision/Verbal cueing Max wheelchair distance: 150    Wheelchair 50 feet with 2 turns activity    Assist        Assist Level: Supervision/Verbal cueing   Wheelchair 150 feet activity     Assist Wheelchair 150 feet activity did not occur: Safety/medical concerns   Assist Level: Supervision/Verbal cueing    Medical Problem List and Plan: 1.  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 Again reviewed diskitis and potential effects on spine with pt today 2. Antithrombotics: -DVT/anticoagulation:Pharmaceutical:Pradexa -antiplatelet therapy: N/a 3. Pain Management:reasonable control  Flexeril changed to Robaxin 500 twice daily as needed  Added lidocaine patch to lumbar paraspinals.   Continue oxycodone prn   Added tramadol prn for moderate pain.  Monitor for nausea associated with Oxy/tramadol  9/21- will change Oxy to Dilaudid 2 mg q4 hours prn.  9/22- helping  9/23- taking 1-2x/day  9/24- changed lidoderm patches to 9am to 9pm  9/30- no pain with VAC changes  4. Mood:Team to provide ego support and encouragement.LCSW to follow for evaluation and support -antipsychotic agents: N/A 5. Neuropsych: This patientiscapable of making decisions on hisown behalf. 6. Skin/Wound Care:See #9 7. Fluids/Electrolytes/Nutrition:Monitor I/Os. Continue prostat supplement due to low protein store and to promote healing. 8. MSSA bacteremia: Naficin every 4 thorough 9/16due to concerns of meningitic spreadthen changed to Cefazolin thorough 10/13 9/28- verified would go HOME on ABX and would need assistance to hook them up, since nursing not theere every time. Pt  understands. 9. Diskitis/left sternoclavicular infection: Wound VACdressing changesMWFper WOC. 10. Left biceps tendinosis/Mild-moderate OA left shoulder: Follow up with ortho post discharge.  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 PAF with RVR: Monitor HR --continue Tikosyn and Pradexa.  Went into A. fib with RVR and spontaneously converted on 9/19 13. T2DM with hyperglycemia: Hgb A1C- 6.7and controlled on metformin BID PTA. Will monitor BS ac/hs with SSI for elevated BS.  CBG (last 3)  Recent Labs    09/29/19 1650 09/29/19 2101 09/30/19 0626  GLUCAP 120* 135* 138*     Metformin 500 daily started on 9/18  10/1--reasonable to borderline control---no changes today 14.Emphysema: Continue Breo. Currently needing supplemental oxygen prn per patient.  15. Hypokalemia/Hypomagnesemia: Continue K dur and Mag Ox  Potassium 4.1 on 9/19  Magnesium 1.8 on 9/17  10/1- K+ 4.2- if Mg was low, K+ would also be low 16.  Hypoalbuminemia  Supplement initiated 17.  Nausea  Appears to be related to pain medications  Improving  9/21- changed oxy to dialudid 2mg  q4 hours prn 18.  Slightly labile blood pressure  Continue to monitor 19.  BUN/creatinine trending up  Encourage fluids  10/1- BUN up to 28- will push fluids.  20. Anxiety/panic attacks  9/22- has Benzo however  would like to prevent these occurences will add Paxil 10 mg x 1 week QHS then increase to 20 mg QHS- since can cause sedation, will do at night. Has tolerated in past for depression.   -Neuropsych has seen patient.  21. HOH with R ear wax  9/25-   Debrox x 4 days.  9/28- improved  22. Wound VAC  9/29- was put back on this AM per CT surgery- will likely need for a few MORE weeks.  23. Dispo- 9/23- will ask if can get H/H aide for pt  9/24- unable to due to pt's higher level of function.   9/28- wife and daughter in law coming in for family training  10/1- more family coming in; d/c Saturday  LOS: 15  days A FACE TO FACE EVALUATION WAS PERFORMED  Andree Heeg 09/30/2019, 9:07 AM

## 2019-09-30 NOTE — Progress Notes (Signed)
Occupational Therapy Session Note  Patient Details  Name: [name] [name]. MRN: [id number] Date of Birth: [birth year]  Today's Date: 09/30/2019 OT Individual Time: 1000-1100 OT Individual Time Calculation (min): 60 min    Short Term Goals: Week 2:  OT Short Term Goal 1 (Week 2): STG=LTG Due to LOS  Skilled Therapeutic Interventions/Progress Updates:    Pt seen for OT session focusing on functional mobility and ADL re-training. Pt in supine upon arrival, agreeable to tx session. He reports daughter-inlaw that was scheduled for family ed session today. Pt denied pain at rest, required repositioning and increased time throughout session to reposition for comfort. Completed bed mobility and sit>stand from EOB with supervision. Addressed toileting task and transfers. Attempted trial using standard 16" toilet. Required mod A for controlled descent to low surface and mod-max A to stand with use of grab bars. Assist required for clothing management as they fell to floor when pt stood and unable to reach. Following seated rest break in w/c, attempted toilet transfer again with BSC over toilet for 21" height. Completed toilet transfers with supervision. Recommend use of BSC over toilet at d/c for increased safety and independence.  He self propelled w/c to therapy gym using B LEs mod I. Completed dynamic standing balance task, standing at RW to remove clothes pins placed around waist band in simulation of LB dressing task. Completed with close supervision. Attempted used of reacher from standing position, however, due to pt's height and inability to squat, he was unable to use reacher from standing position. Was able to use successfully at w/c level. Discussed at length functional implications and functional problem solving in daily situations. Throughout session, pt required min cuing for safe problem solving during functional tasks. Pt left seated in w/c at end of session with hand off to PT.   Therapy  Documentation Precautions:  Precautions Precautions: Fall Precaution Comments: wound vac L [place] joint  Required Braces or Orthoses: Spinal Brace Spinal Brace: Lumbar corset, Applied in sitting position Restrictions Weight Bearing Restrictions: No Other Position/Activity Restrictions: wound vac   Therapy/Group: Individual Therapy  Esmay Amspacher L 09/30/2019, 6:59 AM

## 2019-09-30 NOTE — Progress Notes (Signed)
Physical Therapy Session Note  Patient Details  Name: [name] [name]. MRN: [id number] Date of Birth: Jun 23, 1951  Today's Date: 09/30/2019 PT Individual Time: 0800-0830; 1100-1200 PT Individual Time Calculation (min): 30 min and 60 min  Short Term Goals: Week 2:  PT Short Term Goal 1 (Week 2): =LTG due to ELOS  Skilled Therapeutic Interventions/Progress Updates:    Session 1: Pt received supine in bed, agreeable to PT session. Pt reports 7/10 pain in back at rest, premedicated prior to start of therapy session and use of kpad prior to session. Pt is setup A to doff pants and don shorts at bed level via rolling. Supine to sit with Supervision from flat bed without use of bedrail. Pt is dependent to don LSO while seated EOB due to inability to maintain sitting balance without BUE support when initially sitting up. Sit to stand with Supervision to RW. Stand pivot transfer bed to w/c with RW and Supervision. Pt is able to perform oral hygiene at sink while seated in w/c. Pt agreeable to stay seated in w/c in room with needs in reach at end of session.  Session 2: Pt received seated in w/c in gym handed off from OT session, agreeable to PT session. No complaints of pain. Discussed pt's home setup and use of RW in the home upon d/c. Therapy recommending that pt leave RW close to the bathroom so that he can use it navigate in/out of bathroom but otherwise use w/c for mobility in the home as he will not be able to bring the RW with him while maneuvering around in w/c. Pt asking if he can use 4WW for mobility, not recommending using it at this time due to decreased standing balance and LE strength. Pt agreeable to not use 4WW upon d/c. Sit to stand with Supervision to RW throughout session. Ascend/descend 2 x 6" steps laterally with R handrail and Supervision. Pt demos improved safety with stairs this AM. Ascend/descend one 4" curb step with RW and CGA x 8 reps to simulate entering pt's home. Ambulation 2  x 200 ft with RW and Supervision. Pt demos improved endurance for gait training this date. Pt left seated in w/c in room with needs in reach at end of session.  Therapy Documentation Precautions:  Precautions Precautions: Fall Precaution Comments: wound vac L  joint  Required Braces or Orthoses: Spinal Brace Spinal Brace: Lumbar corset, Applied in sitting position Restrictions Weight Bearing Restrictions: No Other Position/Activity Restrictions: wound vac    Therapy/Group: Individual Therapy   Excell Seltzer, PT, DPT  09/30/2019, 8:48 AM

## 2019-09-30 NOTE — Plan of Care (Signed)
  Problem: Consults Goal: RH GENERAL PATIENT EDUCATION Description: See Patient Education module for education specifics. Outcome: Progressing Goal: Skin Care Protocol Initiated - if Braden Score 18 or less Description: If consults are not indicated, leave blank or document N/A Outcome: Progressing Goal: Nutrition Consult-if indicated Outcome: Progressing Goal: Diabetes Guidelines if Diabetic/Glucose > 140 Description: If diabetic or lab glucose is > 140 mg/dl - Initiate Diabetes/Hyperglycemia Guidelines & Document Interventions  Outcome: Progressing   Problem: RH SKIN INTEGRITY Goal: RH STG SKIN FREE OF INFECTION/BREAKDOWN Description: Patients skin will remain free from further infection or breakdown with min assist. Outcome: Progressing Goal: RH STG MAINTAIN SKIN INTEGRITY WITH ASSISTANCE Description: STG Maintain Skin Integrity With min Assistance. Outcome: Progressing Goal: RH STG ABLE TO PERFORM INCISION/WOUND CARE W/ASSISTANCE Description: STG Able To Perform Incision/Wound Care With total Assistance from caregiver. Outcome: Progressing   Problem: RH PAIN MANAGEMENT Goal: RH STG PAIN MANAGED AT OR BELOW PT'S PAIN GOAL Description: < 4 Outcome: Progressing

## 2019-10-01 ENCOUNTER — Inpatient Hospital Stay (HOSPITAL_COMMUNITY): Payer: Medicare Other

## 2019-10-01 ENCOUNTER — Encounter (HOSPITAL_COMMUNITY): Payer: Medicare Other | Admitting: Occupational Therapy

## 2019-10-01 ENCOUNTER — Ambulatory Visit (HOSPITAL_COMMUNITY): Payer: Medicare Other | Admitting: Physical Therapy

## 2019-10-01 LAB — GLUCOSE, CAPILLARY
Glucose-Capillary: 130 mg/dL — ABNORMAL HIGH (ref 70–99)
Glucose-Capillary: 95 mg/dL (ref 70–99)
Glucose-Capillary: 92 mg/dL (ref 70–99)
Glucose-Capillary: 129 mg/dL — ABNORMAL HIGH (ref 70–99)

## 2019-10-01 MED ORDER — PAROXETINE HCL 20 MG PO TABS: 20.0000 mg | ORAL_TABLET | Freq: Every day | ORAL | 0 refills | Status: DC

## 2019-10-01 MED ORDER — MUSCLE RUB 10-15 % EX CREA: 1.0000 "application " | TOPICAL_CREAM | CUTANEOUS | 0 refills | Status: DC | PRN

## 2019-10-01 MED ORDER — POTASSIUM CHLORIDE CRYS ER 20 MEQ PO TBCR: 20.0000 meq | EXTENDED_RELEASE_TABLET | Freq: Two times a day (BID) | ORAL | 0 refills | Status: DC

## 2019-10-01 MED ORDER — METHOCARBAMOL 500 MG PO TABS: 500.0000 mg | ORAL_TABLET | Freq: Four times a day (QID) | ORAL | 0 refills | Status: DC | PRN

## 2019-10-01 MED ORDER — LIDOCAINE 5 % EX PTCH: 2.0000 | MEDICATED_PATCH | CUTANEOUS | 0 refills | Status: DC

## 2019-10-01 MED ORDER — PRO-STAT SUGAR FREE PO LIQD: 30.0000 mL | Freq: Two times a day (BID) | ORAL | 0 refills | Status: DC

## 2019-10-01 MED ORDER — INFLUENZA VAC A&B SA ADJ QUAD 0.5 ML IM PRSY
0.5000 mL | PREFILLED_SYRINGE | Freq: Once | INTRAMUSCULAR | Status: AC
  Administered 2019-10-01: 0.5 mL via INTRAMUSCULAR
  Filled 2019-10-01: qty 0.5

## 2019-10-01 MED ORDER — METFORMIN HCL ER 500 MG PO TB24: 500.0000 mg | ORAL_TABLET | Freq: Three times a day (TID) | ORAL | Status: DC

## 2019-10-01 MED ORDER — CEFAZOLIN SODIUM-DEXTROSE 2-4 GM/100ML-% IV SOLN: 2.0000 g | Freq: Three times a day (TID) | INTRAVENOUS | Status: DC

## 2019-10-01 MED ORDER — SENNOSIDES-DOCUSATE SODIUM 8.6-50 MG PO TABS: 1.0000 | ORAL_TABLET | Freq: Two times a day (BID) | ORAL | 0 refills | Status: DC

## 2019-10-01 MED ORDER — ALPRAZOLAM 0.5 MG PO TABS: 0.5000 mg | ORAL_TABLET | Freq: Every evening | ORAL | 0 refills | Status: DC | PRN

## 2019-10-01 MED ORDER — MAGNESIUM OXIDE 400 (241.3 MG) MG PO TABS: 400.0000 mg | ORAL_TABLET | Freq: Every day | ORAL | 0 refills | Status: DC

## 2019-10-01 MED ORDER — TRAMADOL HCL 50 MG PO TABS: 50.0000 mg | ORAL_TABLET | Freq: Four times a day (QID) | ORAL | 0 refills | Status: DC | PRN

## 2019-10-01 NOTE — Progress Notes (Signed)
Physical Therapy Session Note  Patient Details  Name: [name] [name]. MRN: [id number] Date of Birth: Aug 29, 1951  Today's Date: 10/01/2019 PT Individual Time: 1330-1430 PT Individual Time Calculation (min): 60 min   Short Term Goals: Week 1:  PT Short Term Goal 1 (Week 1): Pt will complete sit to stand with assist x 1 consistently PT Short Term Goal 1 - Progress (Week 1): Met PT Short Term Goal 2 (Week 1): Pt will initiate stair training PT Short Term Goal 2 - Progress (Week 1): Met PT Short Term Goal 3 (Week 1): Pt will ambulate x 100 ft with LRAD and min A PT Short Term Goal 3 - Progress (Week 1): Met Week 2:  PT Short Term Goal 1 (Week 2): =LTG due to ELOS Week 3:     Skilled Therapeutic Interventions/Progress Updates:   PAIN  8/10  Pt seen bedside for training w/HEP.  Exercises included in HEP and performed during session w/pt. Supine Posterior Pelvic Tilt - 10 reps - 3 sets - 1x daily - 7x weekly                  Hooklying Clamshell with and without  Resistance - 10 reps - 3 sets - HEP1x daily - 7x weekly Supine Single Knee to Chest Stretch - 10 reps - 3 sets - HEP 1x daily - 7x weekly Supine Transversus Abdominis Bracing with Heel Slide - 10 reps -  3 sets - HEP 1x daily - 7x weekly Sit to Stand with Hands on Knees - demonstrated by pt to be progressed to when pt able  Mini Squat with Counter Support - pt unable to perform during session due to pain, demonstrated by therapist and reviewed w/pt. Supine hamstring stretch using towel 30sec x 3 each  Seated Hamstring Curl with Anchored Resistance - tolerated 1set x 10 w/orange theraband resistance Seated Knee Extension with Resistance - tolerated 1set x 10 w/orange theraband resistance  Written illustrated HEP provided to pt, reviewed by pt, and questions clarified w/pt.  Discussed possible modifications due to pain.  During session pt able to roll and perform supine to sit mod I w/rail.  Static sit on edge of bed  w/supervision while therapist donned and doffed Aspen brace and pt performed therex.  Sit to supine w/min assist for LE management.  Pt able to scoot in supine w/bed in trendelenberg due to pain.    Pt left supine w/needs in reach and bed alarm set, rails up x 3 and bed in low position.  Pt unable to proceed w/standing or gait this pm due to LBP, 15 min missed due to pain.  Able to perform all seated and supine therex and following instruction performed w/good technique.  Continues to be limited by pain.   Therapy Documentation Precautions:  Precautions Precautions: Fall Precaution Comments: wound vac L [place] joint  Required Braces or Orthoses: Spinal Brace Spinal Brace: Lumbar corset, Applied in sitting position Restrictions Weight Bearing Restrictions: No Other Position/Activity Restrictions: wound vac; PICC line  Callie Fielding, PT   Therapy/Group: Individual Therapy  Jerrilyn Cairo 10/01/2019, 4:16 PM

## 2019-10-01 NOTE — Progress Notes (Signed)
Social Work Patient ID: [name] [name]., male   DOB: 21-Jan-1951, 68 y.o.   MRN: [id number]   CSW met with pt and talked with his wife to update them on team conference discussion and targeted d/c date of 10-02-19.  Pt feels he is ready and some family/friends will be helping pt and his wife.  CSW is arranging wound VAC, HH, IV antibiotics, and DME.  CSW will continue to follow and assist as needed.

## 2019-10-01 NOTE — Progress Notes (Signed)
Occupational Therapy Discharge Summary  Patient Details  Name: [name] [name]. MRN: [id number] Date of Birth: 1951-05-22   Patient has met 74 of 11 long term goals due to improved activity tolerance, improved balance, postural control and improved coordination.  Patient to discharge at overall supervision- Modified Independent level.  Patient's care partner is independent to provide the necessary physical assistance at discharge.   Pt is supervision-mod I level for short distance ambulation with RW. Recommend use of w/c for IADLs in order to reduce fall risk and energy conservation. Pt requires significantly increased time for mobility and repositioning due to severe lower back pain.  He does require assist to don LSO seated EOB. Pt's family has completed hands on training and observation. They voice willing and ableness to provide needed assist at d/c.   Recommendation:  Patient will benefit from ongoing skilled OT services in home health setting to continue to advance functional skills in the area of BADL and Reduce care partner burden.  Equipment: BSC, TTB, w/c  Reasons for discharge: treatment goals met and discharge from hospital  Patient/family agrees with progress made and goals achieved: Yes  OT Discharge Precautions/Restrictions  Precautions Precautions: Fall Required Braces or Orthoses: Spinal Brace Spinal Brace: Lumbar corset;Applied in sitting position Restrictions Weight Bearing Restrictions: No Other Position/Activity Restrictions: wound vac; PICC line Vision Baseline Vision/History: Wears glasses Wears Glasses: At all times Patient Visual Report: No change from baseline Vision Assessment?: No apparent visual deficits Perception  Perception: Within Functional Limits Praxis Praxis: Intact Cognition Overall Cognitive Status: Within Functional Limits for tasks assessed Arousal/Alertness: Awake/alert Orientation Level: Oriented X4 Focused Attention: Appears  intact Memory: Appears intact Awareness: Appears intact Problem Solving: Appears intact Safety/Judgment: Appears intact Sensation Sensation Light Touch: Impaired Detail Peripheral sensation comments: absent in L LE since back surgery several years ago Proprioception: Appears Intact Coordination Gross Motor Movements are Fluid and Coordinated: No Fine Motor Movements are Fluid and Coordinated: Yes Coordination and Movement Description: impaired 2/2 pain, however, much improved since admission. Heavy reliance on UEs in standing on RW Motor  Motor Motor: Abnormal postural alignment and control Motor - Discharge Observations: Impaired 2/2 pain and low back spasms, heavy reliance on UEs with mobility Trunk/Postural Assessment  Cervical Assessment Cervical Assessment: Within Functional Limits Thoracic Assessment Thoracic Assessment: Exceptions to WFL(Kyphotic; Rounded shoulders) Lumbar Assessment Lumbar Assessment: Exceptions to WFL(Posterior pelvic tilt) Postural Control Postural Control: Deficits on evaluation  Balance Balance Balance Assessed: Yes Static Sitting Balance Static Sitting - Balance Support: Bilateral upper extremity supported;Feet supported Static Sitting - Level of Assistance: 6: Modified independent (Device/Increase time) Static Sitting - Comment/# of Minutes: Sitting EOB Dynamic Sitting Balance Dynamic Sitting - Balance Support: Feet supported;Bilateral upper extremity supported;During functional activity Dynamic Sitting - Level of Assistance: 5: Stand by assistance Static Standing Balance Static Standing - Balance Support: Bilateral upper extremity supported;During functional activity Static Standing - Level of Assistance: 6: Modified independent (Device/Increase time) Static Standing - Comment/# of Minutes: Standing with RW Dynamic Standing Balance Dynamic Standing - Balance Support: During functional activity;Right upper extremity supported;Left upper  extremity supported Dynamic Standing - Level of Assistance: 6: Modified independent (Device/Increase time);5: Stand by assistance Dynamic Standing - Comments: Standing to complete LB clothing management Extremity/Trunk Assessment RUE Assessment RUE Assessment: Within Functional Limits LUE Assessment LUE Assessment: Not tested General Strength Comments: Not assessed 2/2 wound vac at [facility] joint, able to use remainder of UE at functional level   Stace Peace L 10/01/2019, 7:10 AM

## 2019-10-01 NOTE — Progress Notes (Signed)
[place] PHYSICAL MEDICINE & REHABILITATION PROGRESS NOTE  Subjective/Complaints:   Pt reports he got meds and timing figured out- is happy with everything- d/c is tomorrow. Wound VAC is already here- in room.   ROS: Patient denies fever, rash, sore throat, blurred vision, nausea, vomiting, diarrhea, cough, shortness of breath or chest pain,  headache, or mood change.   Objective: Vital Signs: Blood pressure 117/67, pulse 74, temperature 98.3 F (36.8 C), resp. rate 18, height 6\' 1"  (1.854 m), weight 107.6 kg, SpO2 93 %. No results found. Recent Labs    09/30/19 0410  WBC 8.8  HGB 13.1  HCT 38.3*  PLT 159   Recent Labs    09/30/19 0410  NA 137  K 4.2  CL 104  CO2 25  GLUCOSE 142*  BUN 28*  CREATININE 0.86  CALCIUM 9.5    Physical Exam: BP 117/67 (BP Location: Left Arm)   Pulse 74   Temp 98.3 F (36.8 C)   Resp 18   Ht 6\' 1"  (1.854 m)   Wt 107.6 kg   SpO2 93%   BMI 31.30 kg/m  Constitutional: No distress . Vital signs and labs reviewed. Sitting up in bed;awake, alert, appropriate; in good spirits; PA from CT surgery in room initially;  NAD HEENT: EOMI, oral membranes moist Neck: supple Cardiovascular: RRR without murmur.    Respiratory: CTA Bilaterally without wheezes or rales. Normal effort    GI: BS +, non-tender, non-distended  Skin: VAC on;  PICC in RUE Psych: pleasant; no signs of anxiety today again Musc: Left hip and sternum TTP esp from wound VAC Neurologic: Alert Motor: Right upper extremity: 4+/5 proximal to distal Left upper extremity: 4-/5 proximal to distal (some pain inhibition) Left lower extremity: 4/5 proximal to distal, stable Right lower extremity: 4-4+/5 proximal to distal (some pain inhibition), stable  Assessment/Plan: 1. Functional deficits secondary to right thalamic infarct with lumbar discitis which require 3+ hours per day of interdisciplinary therapy in a comprehensive inpatient rehab setting.  Physiatrist is providing  close team supervision and 24 hour management of active medical problems listed below.  Physiatrist and rehab team continue to assess barriers to discharge/monitor patient progress toward functional and medical goals  Care Tool:  Bathing  Bathing activity did not occur: Safety/medical concerns(Unable to tolerate activity/mobility 2/2 pain) Body parts bathed by patient: Right arm, Left arm, Chest, Abdomen, Front perineal area, Right upper leg, Left upper leg, Face, Buttocks, Right lower leg, Left lower leg   Body parts bathed by helper: Buttocks, Right lower leg, Left lower leg     Bathing assist Assist Level: Supervision/Verbal cueing     Upper Body Dressing/Undressing Upper body dressing   What is the patient wearing?: Pull over shirt    Upper body assist Assist Level: Set up assist Assistive Device Comment: assist to manage around wound vac  Lower Body Dressing/Undressing Lower body dressing      What is the patient wearing?: Pants     Lower body assist Assist for lower body dressing: Independent with assitive device     Toileting Toileting    Toileting assist Assist for toileting: Independent with assistive device Assistive Device Comment: urinal   Transfers Chair/bed transfer  Transfers assist     Chair/bed transfer assist level: Independent with assistive device Chair/bed transfer assistive device: Programmer, multimedia   Ambulation assist      Assist level: Supervision/Verbal cueing Assistive device: Walker-rolling Max distance: 200'   Walk 10 feet activity  Assist     Assist level: Supervision/Verbal cueing Assistive device: Walker-rolling   Walk 50 feet activity   Assist    Assist level: Supervision/Verbal cueing Assistive device: Walker-rolling    Walk 150 feet activity   Assist Walk 150 feet activity did not occur: Safety/medical concerns  Assist level: Supervision/Verbal cueing Assistive device: Walker-rolling     Walk 10 feet on uneven surface  activity   Assist Walk 10 feet on uneven surfaces activity did not occur: Safety/medical concerns         Wheelchair     Assist Will patient use wheelchair at discharge?: Yes Type of Wheelchair: Manual    Wheelchair assist level: Supervision/Verbal cueing Max wheelchair distance: 150    Wheelchair 50 feet with 2 turns activity    Assist        Assist Level: Supervision/Verbal cueing   Wheelchair 150 feet activity     Assist Wheelchair 150 feet activity did not occur: Safety/medical concerns   Assist Level: Supervision/Verbal cueing    Medical Problem List and Plan: 1.  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 Again reviewed diskitis and potential effects on spine with pt today 2. Antithrombotics: -DVT/anticoagulation:Pharmaceutical:Pradexa -antiplatelet therapy: N/a 3. Pain Management:reasonable control  Flexeril changed to Robaxin 500 twice daily as needed  Added lidocaine patch to lumbar paraspinals.   Continue oxycodone prn   Added tramadol prn for moderate pain.  Monitor for nausea associated with Oxy/tramadol  9/21- will change Oxy to Dilaudid 2 mg q4 hours prn.  9/22- helping  9/23- taking 1-2x/day  9/24- changed lidoderm patches to 9am to 9pm  9/30- no pain with VAC changes  4. Mood:Team to provide ego support and encouragement.LCSW to follow for evaluation and support -antipsychotic agents: N/A 5. Neuropsych: This patientiscapable of making decisions on hisown behalf. 6. Skin/Wound Care:See #9 7. Fluids/Electrolytes/Nutrition:Monitor I/Os. Continue prostat supplement due to low protein store and to promote healing. 8. MSSA bacteremia: Naficin every 4 thorough 9/16due to concerns of meningitic spreadthen changed to  Cefazolin thorough 10/13 9/28- verified would go HOME on ABX and would need assistance to hook them up, since nursing not theere every time. Pt understands. 9. Diskitis/left sternoclavicular infection: Wound VACdressing changesMWFper WOC. 10. Left biceps tendinosis/Mild-moderate OA left shoulder: Follow up with ortho post discharge.  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 PAF with RVR: Monitor HR --continue Tikosyn and Pradexa.  Went into A. fib with RVR and spontaneously converted on 9/19 13. T2DM with hyperglycemia: Hgb A1C- 6.7and controlled on metformin BID PTA. Will monitor BS ac/hs with SSI for elevated BS.  CBG (last 3)  Recent Labs    09/30/19 1646 09/30/19 2113 10/01/19 0607  GLUCAP 109* 130* 130*     Metformin 500 daily started on 9/18  10/1--reasonable to borderline control---no changes today 14.Emphysema: Continue Breo. Currently needing supplemental oxygen prn per patient.  15. Hypokalemia/Hypomagnesemia: Continue K dur and Mag Ox  Potassium 4.1 on 9/19  Magnesium 1.8 on 9/17  10/1- K+ 4.2- if Mg was low, K+ would also be low 16.  Hypoalbuminemia  Supplement initiated 17.  Nausea  Appears to be related to pain medications  Improving  9/21- changed oxy to dialudid 2mg  q4 hours prn 18.  Slightly labile blood pressure  Continue to monitor 19.  BUN/creatinine trending up  Encourage fluids  10/1- BUN up  to 28- will push fluids.  20. Anxiety/panic attacks  9/22- has Benzo however would like to prevent these occurences will add Paxil 10 mg x 1 week QHS then increase to 20 mg QHS- since can cause sedation, will do at night. Has tolerated in past for depression.   -Neuropsych has seen patient.  21. HOH with R ear wax  9/25-   Debrox x 4 days.  9/28- improved  22. Wound VAC  9/29- was put back on this AM per CT surgery- will likely need for a few MORE weeks.  10/2- going home on wound VAC  23. Dispo- 9/23- will ask if can get H/H aide for  pt  9/24- unable to due to pt's higher level of function.   9/28- wife and daughter in law coming in for family training  10/1- more family coming in; d/c Saturday  10/2- d/c tomorrow  LOS: 16 days A FACE TO FACE EVALUATION WAS PERFORMED  Neal Trulson 10/01/2019, 9:14 AM

## 2019-10-01 NOTE — Progress Notes (Signed)
Occupational Therapy Session Note  Patient Details  Name: [name] [name]. MRN: [id number] Date of Birth: July 11, 1951  Today's Date: 10/01/2019 OT Individual Time: 1000-1100 OT Individual Time Calculation (min): 60 min    Short Term Goals: Week 2:  OT Short Term Goal 1 (Week 2): STG=LTG Due to LOS  Skilled Therapeutic Interventions/Progress Updates:    Pt seen for OT family education session with pt's brother and son. Pt sitting up in w/c upon arrival with family present, agreeable to tx session. Pt reports pain is "well managed" and denied need for intervention.  Education with pt demonstrating throughout session regarding functional transfers including low soft recliner and couch, standard bed and TTB. Pt completed transfers throughout session with supervision-mod I level. Education and teach back provided with how to provide min-mod A if pt needed. Also provided education for proper set-up of TTB and bathroom/home modificiations for increased safety and accessibility. Reviewed recommendations for use of w/c for IADLs, how to donn/doff LSO, CLOF, continuum of care, activity progression and d/c planning. Pt voiced feeling comfortable and confident with planned d/c home at overall mod I level with family able to provide assist PRN.  Pt left seated in w/c in ADL apartment at end of session with hand off to PT.   Therapy Documentation Precautions:  Precautions Precautions: Fall Precaution Comments: wound vac L [place] joint  Required Braces or Orthoses: Spinal Brace Spinal Brace: Lumbar corset, Applied in sitting position Restrictions Weight Bearing Restrictions: No Other Position/Activity Restrictions: wound vac   Therapy/Group: Individual Therapy  [name] L 10/01/2019, 6:42 AM

## 2019-10-01 NOTE — Discharge Summary (Signed)
Physician Discharge Summary  Patient ID: [name] [name]. MRN: [id number] DOB/AGE: [birth year] 68 y.o.  Admit date: 09/15/2019 Discharge date: 10/02/2019  Discharge Diagnoses:  Principal Problem:   Right thalamic infarction [facility]) Active Problems:   Chronic anticoagulation   Atrial fibrillation ([place])   Septic arthritis of sternoclavicular joint, left (HCC)   Diskitis   Hypoalbuminemia due to protein-calorie malnutrition ([place])   Controlled type 2 diabetes mellitus with hyperglycemia, without long-term current use of insulin (HCC)   Elevated BUN   Postoperative pain   Discharged Condition: Stable   Significant Diagnostic Studies: Dg Chest Port 1 View  Result Date: 09/17/2019 CLINICAL DATA:  Left sternoclavicular joint infection. Wound VAC in place. EXAM: PORTABLE CHEST 1 VIEW COMPARISON:  Radiographs 09/01/2019 and 08/31/2019. MRI 09/01/2019 and 08/30/2019. FINDINGS: 0634 hours. New right arm PICC projects to the mid SVC level. The heart size and mediastinal contours are stable with aortic atherosclerosis. Chronic interstitial prominence is again noted throughout both lungs. The bibasilar aeration has mildly improved, consistent with resolving atelectasis. There is no confluent airspace opacity, pneumothorax or significant pleural effusion. The visualized left clavicle appears unchanged. IMPRESSION: 1. Right arm PICC tip at the mid SVC level. 2. Slight improvement in bibasilar aeration. Electronically Signed   By: Richardean Sale M.D.   On: 09/17/2019 09:29    Labs:  Basic Metabolic Panel: BMP Latest Ref Rng & Units 09/30/2019 09/27/2019 09/20/2019  Glucose 70 - 99 mg/dL 142(H) 154(H) 144(H)  BUN 8 - 23 mg/dL 28(H) 28(H) 23  Creatinine 0.61 - 1.24 mg/dL 0.86 0.97 1.17  BUN/Creat Ratio 10 - 24 - - -  Sodium 135 - 145 mmol/L 137 136 137  Potassium 3.5 - 5.1 mmol/L 4.2 4.3 4.9  Chloride 98 - 111 mmol/L 104 104 102  CO2 22 - 32 mmol/L 25 27 27   Calcium 8.9 - 10.3 mg/dL 9.5 9.6 9.4     CBC: CBC Latest Ref Rng & Units 09/30/2019 09/27/2019 09/20/2019  WBC 4.0 - 10.5 K/uL 8.8 9.6 9.5  Hemoglobin 13.0 - 17.0 g/dL 13.1 13.5 14.1  Hematocrit 39.0 - 52.0 % 38.3(L) 40.0 41.0  Platelets 150 - 400 K/uL 159 156 192    CBG: Recent Labs  Lab 10/01/19 0607 10/01/19 1154 10/01/19 1700 10/01/19 2219 10/02/19 0616  GLUCAP 130* 95 92 129* 133*     Brief HPI:   [name] [name]. is a 68 y.o. male with history of CAD, CAF- on pradaxa, T2DM, OSA, NAION with recent brief admission for IV steroids and was discharged to home on steroid taper. He was seen by PCP for RLE cellulitis which improved with doxycycline but he started developing progressive back pain with po intake, bilateral shoulder pain radiating to chest, inability to move BUE and inability to walk. He was found to be dehydrated with leucocytosis and treated with IVF and blood cultures drawn showing MSSA bacteremia.  Work up revealed punctate infarct in right thalamus and splenium corpus callosum question due to micro embolism as well as L4/L5 bone marrow changes with mild paravertebral inflammatory changes. Dr. Leonel Ramsay was following for input and felt that weakness was most likely due to pain from diskitis/osatemomyelitis but to cover patient for possibility spread to meningitis. He was started on IV antibiotics but developed fevers 8/29 and MRI sternum ordered due to swelling showing septic left [place] joint with early osteomyelitis and significant soft tissue stranding with edema and left pectoralis muscle edema.   Dr. Servando Snare consulted for input and patient taken  to OR on 09/02 for I & D of Left [place] joint with application of wound VAC. ID recommended Nafcillin X 14 days with transition to cefazolin on 09/17-10/13.  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 Reactive leucocytosis and thrombocytopenia was resolving  with treatment of infection. Intake continued to be variable and hypokalemia/hypomagnesemia had resolved with supplementation. His activity tolerance was improving but he continued to have significant deficits in mobility as well as decline in ability to perform ADLs. CIR recommended due to functional decline.    Hospital Course: [name] [name]. was admitted to rehab 09/15/2019 for inpatient therapies to consist of PT, ST and OT at least three hours five days a week. Past admission physiatrist, therapy team and rehab RN have worked together to provide customized collaborative inpatient rehab. Protein supplements were added to help promote wound healing. He was transitioned to cefazolin on 9/17 and is tolerating antibiotics without SE.  Back and left clavicle pain is improving. Oxycodone was changed to dilaudid due to issues with nausea and he had tapered himself by discharge to use of tramadol or tylenol as needed with good results. He continues to use lidocaine patches as well as flexeril prn to augment pain control.   Team has provided ego support to help manage anxiety. Paxil was added to help with anxiety/panic attacks and titrated to 20 mg HS.  Diabetes has been monitored with ac/hs CBG checks and SSI was use prn for tighter BS control. As po intake improved, BS started trending upwards therefore metformin was resumed and titrated to home regimen of 500 mg tid ac with improvement. He was advised to monitor BS ac/hs at discharge and follow up with PCP for further adjustment. BP/Heart rate has been monitored on tid basis and he briefly developed A fib with RVR on 9/19 but spontaneously converted back that am.   Follow up labs showed that hypokalemia and hypomagnesemia has resolved with supplementation. He did developa pre-renal azotemia and has been educated on importance of improving fluid intake.  WOC has been following to help with VAC changes and wound VAC was discontinued on 09/28 due to decrease in  depth of wound. However, he had copious drainage from wound requiring multiple dressing changes in 24 hours period therefore VAC was replaced the next day. He will have HHRN to assist with MWF dressing changes after discharge.    He is continent of bowel and bladder. He has been compliant with CPAP use during his stay and respiratory status has improved. He was weaned off oxygen and endurance has improved. He has made gains during rehab stay and is currently at modified independent to supervision level. He will continue to receive follow up HHPT, [place] [facility] [place] [facility]  after discharge  Rehab course: During patient's stay in rehab weekly team conferences were held to monitor patient's progress, set goals and discuss barriers to discharge. At admission, patient required max assist with ADL tasks and total assist with mobility.  He  has had improvement in activity tolerance, balance, postural control as well as ability to compensate for deficits.  He is able to complete ADL task at modified independent to supervision level with increased time and needs assistance to don LSO. He is modified independent for transfers, to climb 6 stairs and to ambulate 200' with RW. Wheelchair recommended for IADLs to help with energy  conservation and fall prevention. Family education was completed regarding all aspects of care and safety.    Disposition: Home  Diet: Carb Modified/Heart healthy.   Special Instructions: 1. Monitor blood sugars before meals and at bedtime.  2. IV ancef to continue thorough 10/12/19 with weekly labs per OPAT.  ID to provide input regarding further antibiotic regimen.    Discharge Instructions    Ambulatory referral to Physical Medicine Rehab   Complete by: As directed    Home infusion instructions [facility] May follow [facility] Dosing Protocol; May administer Cathflo as needed to maintain patency of vascular access device.; Flushing of vascular access device: per  [facility] Protocol: 0.9% NaCl pre/post medica...   Complete by: As directed    Instructions: May follow [place] Dosing Protocol   Instructions: May administer Cathflo as needed to maintain patency of vascular access device.   Instructions: Flushing of vascular access device: per [facility] Protocol: 0.9% NaCl pre/post medication administration and prn patency; Heparin 100 u/ml, 31m for implanted ports and Heparin 10u/ml, 53mfor all other central venous catheters.   Instructions: May follow [facility] Anaphylaxis Protocol for First Dose Administration in the home: 0.9% NaCl at 25-50 ml/hr to maintain IV access for protocol meds. Epinephrine 0.3 ml IV/IM PRN and Benadryl 25-50 IV/IM PRN s/s of anaphylaxis.   Instructions: [facility] Coordinator (RN) to assist per patient IV care needs in the home PRN.     Allergies as of 10/02/2019      Reactions   Pseudoephedrine Other (See Comments)   Patient went into afib   Novocain [procaine] Hives   Dentist appointment in 1968; since then has tolerated lidocaine and provocaine with no hives or difficulty.   Quinolones    Patient was warned about not using Cipro and similar antibiotics. Recent studies have raised concern that fluoroquinolone antibiotics could be associated with an increased risk of aortic aneurysm Fluoroquinolones have non-antimicrobial properties that might jeopardise the integrity of the extracellular matrix of the vascular wall In a  propensity score matched [place] study [place] a 66% increased rate of aortic aneurysm or dissection associated with oral fluoroquinolone use, compared wit   Cardizem [diltiazem] Rash   Also 2019   Pseudoephedrine Hcl Palpitations   Sulfonamide Derivatives Other (See Comments)   Childhood reaction       Medication List    STOP taking these medications   cyclobenzaprine 10 MG tablet Commonly known as: FLEXERIL   diclofenac sodium 1 % Gel Commonly known as: Voltaren    HYDROcodone-acetaminophen 5-325 MG tablet Commonly known as: Norco   oxyCODONE-acetaminophen 5-325 MG tablet Commonly known as: PERCOCET/ROXICET   predniSONE 20 MG tablet Commonly known as: DELTASONE   Suvorexant 20 MG Tabs Commonly known as: Belsomra   V-R MAGNESIUM 250 MG Tabs Generic drug: Magnesium     TAKE these medications   acetaminophen 325 MG tablet Commonly known as: TYLENOL Take 650 mg by mouth every 6 (six) hours as needed for mild pain.   albuterol 108 (90 Base) MCG/ACT inhaler Commonly known as: VENTOLIN HFA Inhale 2 puffs into the lungs every 6 (six) hours as needed for wheezing or shortness of breath.   Alphagan P 0.1 % Soln Generic drug: brimonidine Place 1 drop into both eyes 3 (three) times daily.   ALPRAZolam 0.5 MG tablet--Rx # 15 pills  Commonly known as: XANAX Take 1 tablet (0.5 mg total) by mouth at bedtime as needed for anxiety.   atorvastatin  80 MG tablet Commonly known as: LIPITOR TAKE 1 TABLET BY MOUTH  DAILY What changed: when to take this   Breo Ellipta 200-25 MCG/INH Aepb Generic drug: fluticasone furoate-vilanterol Inhale 1 puff into the lungs daily as needed (shortness of breath).   ceFAZolin  IVPB Commonly known as: ANCEF Inject 2 g into the vein every 8 (eight) hours. Indication:  bacteremia  Last Day of Therapy:  10/12/19 Labs - Once weekly:  CBC/D and BMP, Labs - Every other week:  ESR and CRP   diphenhydrAMINE 25 MG tablet Commonly known as: BENADRYL Take 25 mg by mouth at bedtime as needed for sleep.   dofetilide 500 MCG capsule Commonly known as: TIKOSYN Take 1 capsule (500 mcg total) by mouth 2 (two) times daily.   famotidine 10 MG tablet Commonly known as: PEPCID Take 10 mg by mouth as needed for heartburn.   feeding supplement (PRO-STAT SUGAR FREE 64) Liqd Take 30 mLs by mouth 2 (two) times daily.   fexofenadine 180 MG tablet Commonly known as: ALLEGRA Take 180 mg by mouth daily.   fish oil-omega-3 fatty  acids 1000 MG capsule Take 1 g by mouth 2 (two) times daily.   fluticasone 50 MCG/ACT nasal spray Commonly known as: FLONASE Place 2 sprays into both nostrils daily as needed for allergies.   lidocaine 5 % Commonly known as: Lidoderm Place 2 patches onto the skin daily. Apply to lumbar spine at 7 am and remove at 7 pm daily What changed:   how much to take  additional instructions Notes to patient: Can purchase over the counter if insurance does not cover it   magnesium oxide 400 (241.3 Mg) MG tablet Commonly known as: MAG-OX Take 1 tablet (400 mg total) by mouth daily. What changed:   medication strength  how much to take  when to take this  additional instructions   metFORMIN 500 MG 24 hr tablet Commonly known as: GLUCOPHAGE-XR Take 1 tablet (500 mg total) by mouth 3 (three) times daily.   methocarbamol 500 MG tablet Commonly known as: ROBAXIN Take 1 tablet (500 mg total) by mouth 4 (four) times daily as needed for muscle spasms.   MULTIPLE VITAMIN PO Take 1 tablet by mouth every evening.   Muscle Rub 10-15 % Crea Apply 1 application topically as needed for muscle pain.   nitroGLYCERIN 0.4 MG SL tablet Commonly known as: NITROSTAT Place 1 tablet (0.4 mg total) under the tongue every 5 (five) minutes as needed for chest pain.   ondansetron 4 MG disintegrating tablet Commonly known as: Zofran ODT Take 1 tablet (4 mg total) by mouth every 8 (eight) hours as needed for nausea or vomiting.   OneTouch Verio test strip Generic drug: glucose blood USE TWICE A DAY   pantoprazole 40 MG tablet Commonly known as: PROTONIX Take 40 mg by mouth daily.   PARoxetine 20 MG tablet Commonly known as: PAXIL Take 1 tablet (20 mg total) by mouth at bedtime.   potassium chloride SA 20 MEQ tablet Commonly known as: Klor-Con M20 Take 1 tablet (20 mEq total) by mouth 2 (two) times daily. What changed:   how much to take  when to take this   Pradaxa 150 MG Caps  capsule Generic drug: dabigatran TAKE 1 CAPSULE BY MOUTH  EVERY 12 HOURS   senna-docusate 8.6-50 MG tablet Commonly known as: Senokot-S Take 1 tablet by mouth 2 (two) times daily.   tamsulosin 0.4 MG Caps capsule Commonly known as: FLOMAX TAKE 1 CAPSULE  BY MOUTH EVERY DAY What changed:   when to take this  reasons to take this   traMADol 50 MG tablet--Rx # 28 pills Commonly known as: ULTRAM Take 1 tablet (50 mg total) by mouth 4 (four) times daily as needed for severe pain.   triamcinolone cream 0.1 % Commonly known as: KENALOG Apply 1 application topically 2 (two) times daily. As needed. What changed:   when to take this  reasons to take this  additional instructions            Home Infusion Instuctions  (From admission, onward)         Start     Ordered   09/29/19 0000  Home infusion instructions [facility] May follow [place] Dosing Protocol; May administer Cathflo as needed to maintain patency of vascular access device.; Flushing of vascular access device: per [facility] Protocol: 0.9% NaCl pre/post medica...    Question Answer Comment  Instructions May follow [place] Dosing Protocol   Instructions May administer Cathflo as needed to maintain patency of vascular access device.   Instructions Flushing of vascular access device: per [facility] Protocol: 0.9% NaCl pre/post medication administration and prn patency; Heparin 100 u/ml, 25m for implanted ports and Heparin 10u/ml, 530mfor all other central venous catheters.   Instructions May follow [facility] Anaphylaxis Protocol for First Dose Administration in the home: 0.9% NaCl at 25-50 ml/hr to maintain IV access for protocol meds. Epinephrine 0.3 ml IV/IM PRN and Benadryl 25-50 IV/IM PRN s/s of anaphylaxis.   Instructions [facility] Infusion Coordinator (RN) to assist per patient IV care needs in the home PRN.      09/29/19 1254         Follow-up Information    [place], MeJinny BlossomMD Follow up.   Specialty:  Physical Medicine and Rehabilitation Why: Office will call you with follow up appointment Contact information: [phone]. [phone]        GeGrace IsaacMD Follow up on 10/14/2019.   Specialty: Cardiothoracic Surgery Why: You have an appointment with Dr. GeServando Snareor a wound check on Thursday, 10/15 at 1pm. Please bring your wound vac dressing change kit with you to this appointment.   Contact information: [id number] [place] [phone]      PaBurnis MedinMD Follow up.   Specialties: Internal Medicine, Pediatrics Contact information: [phone]      DiRaleigh CallasNP Follow up on 10/12/2019.   Specialty: Infectious Diseases Why: Appointment at 2:30 pm.  Contact information: [address] [phone]         Signed: PaBary Leriche0/04/2019, 5:11 PM

## 2019-10-01 NOTE — Progress Notes (Signed)
Physical Therapy Discharge Summary  Patient Details  Name: [name] [name]. MRN: [id number] Date of Birth: December 29, 1951  Today's Date: 10/01/2019   Patient has met 7 of 7 long term goals due to improved activity tolerance, improved balance, improved postural control, increased strength, decreased pain and ability to compensate for deficits.  Patient to discharge at a wheelchair and an ambulatory level Supervision to [place].   Patient's care partner is independent to provide the necessary physical assistance at discharge. Pt's daughter in law, brother, and son have completed hands-on family education and are safe to assist pt upon d/c home.  Reasons goals not met: Pt has met all rehab goals.  Recommendation:  Patient will benefit from ongoing skilled PT services in home health setting to continue to advance safe functional mobility, address ongoing impairments in endurance, strength, safety, and minimize fall risk.  Equipment: RW and 18x18 wheelchair  Reasons for discharge: treatment goals met and discharge from hospital  Patient/family agrees with progress made and goals achieved: Yes  PT Discharge Precautions/Restrictions Precautions Precautions: Fall Precaution Comments: wound vac L [place] joint  Required Braces or Orthoses: Spinal Brace Spinal Brace: Lumbar corset;Applied in sitting position Restrictions Weight Bearing Restrictions: No Other Position/Activity Restrictions: wound vac; PICC line Vision/Perception  Vision - History Baseline Vision: Wears glasses all the time Perception Perception: Within Functional Limits Praxis Praxis: Intact  Cognition Overall Cognitive Status: Within Functional Limits for tasks assessed Arousal/Alertness: Awake/alert Orientation Level: Oriented X4 Attention: Focused Focused Attention: Appears intact Memory: Appears intact Awareness: Appears intact Problem Solving: Appears intact Safety/Judgment: Appears  intact Sensation Sensation Light Touch: Impaired Detail Light Touch Impaired Details: Impaired LLE(absent in distal LLE at baseline) Proprioception: Appears Intact Coordination Gross Motor Movements are Fluid and Coordinated: No Fine Motor Movements are Fluid and Coordinated: Yes Coordination and Movement Description: impaired 2/2 pain, however, much improved since admission. Heavy reliance on UEs in standing on RW Motor  Motor Motor: Abnormal postural alignment and control Motor - Skilled Clinical Observations: impaired 2/2 pain and low back spasms Motor - Discharge Observations: Impaired 2/2 pain and low back spasms, heavy reliance on UEs with mobility  Mobility Bed Mobility Bed Mobility: Rolling Right;Rolling Left;Supine to Sit;Sit to Supine Rolling Right: Independent with assistive device Rolling Left: Independent with assistive device Supine to Sit: Independent with assistive device Sit to Supine: Independent with assistive device Transfers Transfers: Sit to Stand;Stand Pivot Transfers Sit to Stand: Independent with assistive device Stand Pivot Transfers: Independent with assistive device Transfer (Assistive device): Rolling walker Locomotion  Gait Ambulation: Yes Gait Assistance: Independent with assistive device Gait Distance (Feet): 200 Feet Assistive device: Rolling walker Gait Gait: Yes Gait Pattern: Impaired Gait Pattern: Trunk flexed(heavy BUE reliance) Gait velocity: decreased Stairs / Additional Locomotion Stairs: Yes Stairs Assistance: Independent with assistive device Stair Management Technique: One rail Right;Sideways Number of Stairs: 3 Height of Stairs: 6 Wheelchair Mobility Wheelchair Mobility: Yes Wheelchair Assistance: Independent with Camera operator: Both lower extermities Wheelchair Parts Management: Independent Distance: 150  Trunk/Postural Assessment  Cervical Assessment Cervical Assessment: Within Functional  Limits Thoracic Assessment Thoracic Assessment: Exceptions to WFL(kyphotic; rounded shoulders) Lumbar Assessment Lumbar Assessment: Exceptions to WFL(posterior pelvic tilt) Postural Control Postural Control: Within Functional Limits  Balance Balance Balance Assessed: Yes Static Sitting Balance Static Sitting - Balance Support: No upper extremity supported;Feet supported Static Sitting - Level of Assistance: 6: Modified independent (Device/Increase time) Dynamic Sitting Balance Dynamic Sitting - Balance Support: No upper extremity supported;Feet supported;During functional activity Dynamic Sitting -  Level of Assistance: 6: Modified independent (Device/Increase time) Static Standing Balance Static Standing - Balance Support: Bilateral upper extremity supported;During functional activity Static Standing - Level of Assistance: 6: Modified independent (Device/Increase time) Dynamic Standing Balance Dynamic Standing - Balance Support: Bilateral upper extremity supported;During functional activity Dynamic Standing - Level of Assistance: 6: Modified independent (Device/Increase time) Extremity Assessment   RLE Assessment RLE Assessment: Within Functional Limits Passive Range of Motion (PROM) Comments: tight HS General Strength Comments: grossly 4/5 LLE Assessment LLE Assessment: Within Functional Limits Passive Range of Motion (PROM) Comments: tight HS General Strength Comments: grossly 4/5     Excell Seltzer, PT, DPT 10/01/2019, 12:42 PM Callie Fielding, PT

## 2019-10-01 NOTE — Progress Notes (Signed)
Social Work Discharge Note   The overall goal for the admission was met for:   Discharge location: Yes - home with wife and other family members  Length of Stay: Yes - 17 days  Discharge activity level: Yes - modified independent with some supervision  Home/community participation: Yes  Services provided included: MD, RD, PT, OT, RN, Pharmacy, Neuropsych and SW  Financial Services: Private Insurance: [employer]  Follow-up services arranged: Home Health: PT/OT/RN from [facility], DME: 18"x18" lightweight wheelchair with basic cushion; bedside commode; rolling walker; tub transfer bench from [employer], Other: [facility] for IV antibiotics and Patient/Family has no preference for HH/DME agencies  Comments (or additional information):  Pt's wife, dtr-in-law, son, and brother-in-law have received family education.  Patient/Family verbalized understanding of follow-up arrangements: Yes  Individual responsible for coordination of the follow-up plan: pt and his wife, [name] [name] [phone]; [phone]  Confirmed correct DME delivered: Trey Sailors 10/01/2019    Prevatt, Silvestre Mesi

## 2019-10-01 NOTE — Patient Care Conference (Signed)
Inpatient RehabilitationTeam Conference and Plan of Care Update Date: 09/29/2019   Time: 9:40 AM    Patient Name: [name] [name].      Medical Record Number: [id number]  Date of Birth: [birth year] Sex: Male         Room/Bed: 4M03C/4M03C-01 Payor Info: Payor: Marine scientist / Plan: [facility] MEDICARE / Product Type: *No Product type* /    Admit Date/Time:  09/15/2019  3:10 PM  Primary Diagnosis:  Right thalamic infarction [facility])  Patient Active Problem List   Diagnosis Date Noted  . Labile blood pressure   . Elevated BUN   . Postoperative pain   . Atrial fibrillation with rapid ventricular response ([place])   . Nausea without vomiting   . Controlled type 2 diabetes mellitus with hyperglycemia, without long-term current use of insulin ([place])   . Right thalamic infarction ([place])   . Hypoalbuminemia due to protein-calorie malnutrition ([place])   . Hypokalemia   . Hypomagnesemia   . Diabetes mellitus type 2 in obese ([place])   . Diskitis 09/15/2019  . Suspected infectious meningitis 09/01/2019  . MSSA bacteremia 08/30/2019  . Discitis of lumbar region 08/30/2019  . Left shoulder pain 08/30/2019  . Septic arthritis of sternoclavicular joint, left ([place]) 08/30/2019  . Myelopathy ([place]) 08/27/2019  . H/O optic neuritis 08/27/2019  . Acute back pain with sciatica, right 09/04/2017  . Right hip pain 09/04/2017  . Thrombocytopenia ([place]) 06/25/2016  . Atrial fibrillation ([place]) 01/01/2016  . 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 Iliac aneurysm ([place])   . Morbid obesity ([place]) 07/08/2014  . Type 2 diabetes mellitus with other circulatory complications ([place]) 0000000  . Chronic anticoagulation 12/21/2013  . Sleep disorder 06/21/2013  . Decreased hearing 08/06/2012  . Hemorrhoids 08/06/2012  . High risk medication use 05/05/2012  . Anticoagulation management encounter 05/05/2012  . Retinal tear 04/29/2012  . Personal history of colonic polyps 05/05/2011  . Visit for preventive health  examination 05/05/2011  . PALPITATIONS 08/16/2010  . LIVER FUNCTION TESTS, ABNORMAL 04/25/2010  . Lateral epicondylitis 10/24/2009  . TOBACCO USE, QUIT 10/24/2009  . OBESITY, UNSPECIFIED 04/24/2009  . Hyperlipidemia 03/22/2009  . ERECTILE DYSFUNCTION 03/22/2009  . Obstructive sleep apnea 03/22/2009  . 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 MYOCARDIAL INFARCTION, HX OF 03/22/2009  . Coronary atherosclerosis 03/22/2009  . Atrial fibrillation with RVR ([place]) 03/22/2009  . GERD 03/22/2009    Expected Discharge Date: Expected Discharge Date: 10/02/19  Team Members Present: Physician leading conference: Dr. Alysia Penna Social Worker Present: Alfonse Alpers, LCSW Nurse Present: Rayetta Pigg, RN PT Present: Excell Seltzer, PT OT Present: Amy Rounds, OT SLP Present: Other (comment)(Erin Tamala Julian, SLP) PPS Coordinator present : Gunnar Fusi, SLP     Current Status/Progress Goal Weekly Team Focus  Bowel/Bladder   Continent of B/B. LBM on 9/29  Remains continent of B/B  Assess toileting needs q shift and PRN   Swallow/Nutrition/ Hydration             ADL's   Close supervision-min A overall  Set-up-mod I overall, supervision shower transfer and IADLs from w/c level  ADL/IADL re-training, pain management, family education and d/c planning   Mobility   Mini to Mod assist with transfers  Mini assist with transfers without injuries  Educate on transfers and endurance   Communication             Safety/Cognition/ Behavioral Observations            Pain   C/O  back pain. Pain scale of 7 on the pain scale of 0 - 10. Uses lumbar corsett brace when OOB  </= 3  Assess pain every shift and PRN   Skin   Wound Vac to left upper chest, Ecchymosis to bil arm  To prevent infection and breakdown  Assess skin q shift and PRN    Rehab Goals Patient on target to meet rehab goals: Yes Rehab Goals Revised: none *See Care Plan and progress notes for long and short-term goals.     Barriers to  Discharge  Current Status/Progress Possible Resolutions Date Resolved   Nursing  IV antibiotics               PT                    OT                  SLP                SW                Discharge Planning/Teaching Needs:  Pt to return to his home with his wife to provide supervision only.  Other family members can assist intermittently.  Wife and dtr-in-law came in for IV antibiotic administration education.  Son and brother to come for PT/OT training 10-01-19.   Team Discussion:  Pt has PICC line in R UE for IV antibiotics and family came for education on how to administer.  Pt will also have wound VAC for home use.  Pt's pain is under control and will be ready for d/c on 10-02-19.  Pt is S/CGA and can walk 100' with rolling walker and CGA for stairs.  Pt has mod I goals overall.   Revisions to Treatment Plan:  none    Medical Summary Current Status: Pt has wound VAC put back on; a little dry on last BMP Weekly Focus/Goal: improve dehydration/  Barriers to Discharge: Decreased family/caregiver support;IV antibiotics;Medical stability;Weight;Wound care;Other (comments)  Barriers to Discharge Comments: Wound VAC Possible Resolutions to Barriers: family training   Continued Need for Acute Rehabilitation Level of Care: The patient requires daily medical management by a physician with specialized training in physical medicine and rehabilitation for the following reasons: Direction of a multidisciplinary physical rehabilitation program to maximize functional independence : Yes Medical management of patient stability for increased activity during participation in an intensive rehabilitation regime.: Yes Analysis of laboratory values and/or radiology reports with any subsequent need for medication adjustment and/or medical intervention. : Yes   I attest that I was present, lead the team conference, and concur with the assessment and plan of the team.Team conference was held via web/  teleconference due to [place] - 19.   Tru Rana, Silvestre Mesi 10/01/2019, 1:40 AM

## 2019-10-01 NOTE — Progress Notes (Signed)
Patient to self-adminisiter CPAP using his machine and FFM  from home.  Humidifier refilled.  Patient is familiar with equipment and procedure.

## 2019-10-01 NOTE — Progress Notes (Signed)
Physical Therapy Session Note  Patient Details  Name: [name] [name]. MRN: [id number] Date of Birth: 06/23/1951  Today's Date: 10/01/2019 PT Individual Time: 1100-1200 PT Individual Time Calculation (min): 60 min   Short Term Goals: Week 2:  PT Short Term Goal 1 (Week 2): =LTG due to ELOS  Skilled Therapeutic Interventions/Progress Updates:    Pt received seated in w/c in ADL apartment handed off from OT session. Pt's brother and son present for hands-on family education session. Pt is mod I to stand to RW throughout session. Pt is able to ambulate up to 200' with RW at [facility] I level. Pt is at mod I level for w/c mobility and management of w/c parts. Pt is setup A for managing RW while ambulating up/down stairs with R handrail. Demonstrated how to move RW up stairs to get pt setup to ascend threshold to enter home. Pt's family demonstrates good understanding of how to setup RW for stair navigation. Car transfer at mod I level. Pt's family demonstrates good overall understanding of what pt needs assist with upon d/c home. Pt left seated in w/c in room with needs in reach at end of session. Pt is safe to d/c home tomorrow at mod I to setup A level.  Therapy Documentation Precautions:  Precautions Precautions: Fall Precaution Comments: wound vac L  joint  Required Braces or Orthoses: Spinal Brace Spinal Brace: Lumbar corset, Applied in sitting position Restrictions Weight Bearing Restrictions: No Other Position/Activity Restrictions: wound vac; PICC line    Therapy/Group: Individual Therapy   Excell Seltzer, PT, DPT  10/01/2019, 12:50 PM

## 2019-10-02 LAB — GLUCOSE, CAPILLARY: Glucose-Capillary: 133 mg/dL — ABNORMAL HIGH (ref 70–99)

## 2019-10-02 MED ORDER — HEPARIN SOD (PORK) LOCK FLUSH 100 UNIT/ML IV SOLN
250.0000 [IU] | INTRAVENOUS | Status: AC | PRN
  Administered 2019-10-02: 250 [IU]
  Filled 2019-10-02: qty 2.5

## 2019-10-02 NOTE — Plan of Care (Signed)
MET 

## 2019-10-02 NOTE — Progress Notes (Signed)
Pt discharging to home. RN gave morning dose of antibiotics via PICC and spoke with infusion nurse and verified that patient had supplies to do evening doses. Pt's wound vac dressing was changed and RN verified home vac unit was operating correctly. Pt educated on problem solving for alarms and educated on PICC line care. Pt's equipment was delivered this am. Pt ready for discharge and awaiting ride home. Pt's wedding band was retrieved from security and returned to patient.

## 2019-10-05 ENCOUNTER — Telehealth: Payer: Self-pay | Admitting: Registered Nurse

## 2019-10-05 ENCOUNTER — Telehealth: Payer: Self-pay | Admitting: Internal Medicine

## 2019-10-05 NOTE — Telephone Encounter (Signed)
Transitional Care call  Patient name: [name] [name]. [name] [name]  DOB: August 21, 1951 1. Are you/is patient experiencing any problems since coming home? No a. Are there any questions regarding any aspect of care? No 2. Are there any questions regarding medications administration/dosing? No a. Are meds being taken as prescribed? Yes b. "Patient should review meds with caller to confirm" Medication List Reviewed 3. Have there been any falls? No 4. Has Home Health been to the house and/or have they contacted you? Yes, [facility] a. If not, have you tried to contact them? No b. Can we help you contact them? No 5. Are bowels and bladder emptying properly? Yes a. Are there any unexpected incontinence issues? No b. If applicable, is patient following bowel/bladder programs? NA 6. Any fevers, problems with breathing, unexpected pain? No 7. Are there any skin problems or new areas of breakdown? No 8. Has the patient/family member arranged specialty MD follow up (ie cardiology/neurology/renal/surgical/etc.)?  [name] [name] was instructed to call to schedule HFU appointment, he verbalizes understanding. His cardiothoracic  And infectious disease HFU appoints are scheduled already.  a. Can we help arrange? NA 9. Does the patient need any other services or support that we can help arrange? No 10. Are caregivers following through as expected in assisting the patient? Yes 11. Has the patient quit smoking, drinking alcohol, or using drugs as recommended? [name] [name] states he doesn't smoke, drink alcohol or use illicit drugs.   Appointment date/time 10/13/2019  arrival time 1:00 for 1:20 appointment with Dr. Dagoberto Ligas. At [facility]

## 2019-10-05 NOTE — Telephone Encounter (Signed)
Please advise 

## 2019-10-05 NOTE — Telephone Encounter (Signed)
[place] with [facility] calling for verbal OT orders 2 wk 2 1 wk 2   cb  [phone]

## 2019-10-05 NOTE — Telephone Encounter (Signed)
Advise  Orders be  Managed currently by  physical medicine   Since they are doing the transitional care  appt   On October 14    So please send order to dr Dagoberto Ligas  For now  Until he is discharge from [facility] rehab.

## 2019-10-06 ENCOUNTER — Telehealth: Payer: Self-pay

## 2019-10-06 NOTE — Telephone Encounter (Signed)
[facility] has been given message to send orders to [place]

## 2019-10-06 NOTE — Telephone Encounter (Signed)
[name] [name] called requesting for verbal orders for 2wk4 then 1wk4, called her back and approved verbal orders.

## 2019-10-07 ENCOUNTER — Encounter: Payer: Self-pay | Admitting: Internal Medicine

## 2019-10-08 NOTE — Progress Notes (Signed)
Virtual Visit via Video Note  I connected with@ on  10 12 2020 at  9:30 AM EDT by a video enabled telemedicine application and verified that I am speaking with the correct person using two identifiers. Location patient: home Location provider:work  office Persons participating in the virtual visit: patient, provider  [place] national recommendations  regarding COVID 19 pandemic   video visit is advised over in office visit for this patient.  Patient aware  of the limitations of evaluation and management by telemedicine and  availability of in person appointments. and agreed to proceed.   HPI: [name] [name]. presents for video visit   For post hospital follow up . Mr. 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  He went to [facility]  rehab and despite continuing pain pushed his exercise ability. He was recently discharged after and his current status that he has lost 30 pounds plus respiratory status stable; he still cannot walk unassisted uses wheelchair and a walker using Robaxin and tramadol He reports significant muscle atrophy and feeling of weakness in his legs. as needed for pain  Sleep During the hospital life he was taken off Belsomra given Paxil for mood disturbance and Xanax to use at night for sleep. He was only given a small amount wonders what he should do in the future although he has been able to sleep since he has been home   Has osa and on cpap   Is to maintain physical and occupational therapy at home has follow-up with rehabilitation services vascular surgery and infectious disease.  His medications were changed in the hospital and was a bit concerned about the reduction in his magnesium supplementation from 750-400.  He has no diarrhea or vomiting at this time. Diabetes he is continues on metformin has not checked his blood sugar recently actually had a low of 70 when he was in the hospital but no  concern about control.   ROS: See pertinent positives and negatives per HPI. Says memory "not too bad" can still answer ?s on jeopardy  Past Medical History:  Diagnosis Date  . Atrial fibrillation ([place]) 03/22/2009   a. s/p multiple DCCV; b. no coumadin due to low TE risk profile; c. Tikosyn Rx  . Coronary atherosclerosis of native coronary artery 11/2002   a. s/p stent to LAD 12/03; OM2 occluded at cath 12/03; d. myoview 5/10: no ischemia;  e. echo 7/11: EF 55%, BAE, mild RVE, PASP 41-45; Myoview was in March 2013. There was no ischemia or infarction, EF 51%   . Cutaneous abscess of back excluding buttocks 07/04/2014   Appears to stem from possibly a cyst very large area 6 cm contact surgeon office   . Diabetes mellitus without complication ([place])   . Drusen body    see opth note  . ERECTILE DYSFUNCTION 03/22/2009  . GERD 03/22/2009  . HYPERGLYCEMIA 04/25/2010  . HYPERLIPIDEMIA 03/22/2009  . 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 LATERAL EPICONDYLITIS, LEFT 10/24/2009  . LIVER FUNCTION TESTS, ABNORMAL 04/25/2010  . Local reaction to immunization 05/05/2012   minor resolving  zostavax   . Myocardial infarction ([place]) mi2003  . Numbness in left leg    foot related to back disease and surgery  . Obesity, unspecified 04/24/2009  . Perforated appendicitis with necrosis s/p open appendectomy 06/07/14 06/04/2014  . Renal cyst    Characterized by MRI as  simple  . Ruptured suppurative appendicitis    2015   . SLEEP APNEA, OBSTRUCTIVE 03/22/2009   compliant with CPAP  . THROMBOCYTOPENIA 08/16/2010  . TOBACCO USE, QUIT 10/24/2009  . 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 Umbilical hernia     Past Surgical History:  Procedure Laterality Date  . APPENDECTOMY N/A 06/06/2014   Procedure: APPENDECTOMY;  Surgeon: Odis Hollingshead, MD;  Location: [facility] OR[facility];  Service: General;  Laterality: N/A;  . COLON SURGERY    . COLONOSCOPY  11/15/2011   Procedure: COLONOSCOPY;  Surgeon:  Juanita Craver, MD;  Location: [facility] ENDOSCOPY;  Service: Endoscopy;  Laterality: N/A;  . COLONOSCOPY WITH PROPOFOL N/A 01/03/2017   Procedure: COLONOSCOPY WITH PROPOFOL;  Surgeon: Carol Ada, MD;  Location: [facility] ENDOSCOPY;  Service: Endoscopy;  Laterality: N/A;  . cyst on epiglottis  08/2002  . ESOPHAGOGASTRODUODENOSCOPY  11/15/2011   Procedure: ESOPHAGOGASTRODUODENOSCOPY (EGD);  Surgeon: Juanita Craver, MD;  Location: [facility] ENDOS[facility];  Service: Endoscopy;  Laterality: N/A;  . LAPAROSCOPIC APPENDECTOMY N/A 06/06/2014   Procedure: APPENDECTOMY LAPAROSCOPIC attemted;  Surgeon: Odis Hollingshead, MD;  Location: [facility];  Service: General;  Laterality: N/A;  . LUMBAR DISC SURGERY     two  holes in spinalcovering  . stent     LAD DUS 2004  . STERNAL WOUND DEBRIDEMENT Left 08/31/2019   Procedure: LEFT STERNOCLAVICULAR WOUND DEBRIDEMENT WITH APPLICATION OF WOUND VAC;  Surgeon: Grace Isaac, MD;  Location: [place];  Service: Thoracic;  Laterality: Left;  . surgery l4-l5   1998   ruptured x 3  . ulnar neuropathy    . UMBILICAL HERNIA REPAIR     mesh    Family History  Problem Relation Age of Onset  . Thyroid disease Mother   . Ovarian cancer Mother   . Breast cancer Mother   . Lung cancer Father   . Cancer Father     Social History   Tobacco Use  . Smoking status: Former Smoker    Packs/day: 2.00    Years: 42.00    Pack years: 84.00    Types: Cigarettes    Quit date: 12/30/2008    Years since quitting: 10.7  . Smokeless tobacco: Never Used  . Tobacco comment: started at age 45; 1-2 ppd; quit 2010  Substance Use Topics  . Alcohol use: No    Alcohol/week: 0.0 standard drinks    Comment: rarely; maybe 1 beer a year  . Drug use: No      Current Outpatient Medications:  .  acetaminophen (TYLENOL) 325 MG tablet, Take 650 mg by mouth every 6 (six) hours as needed for mild pain., Disp: , Rfl:  .  albuterol (PROVENTIL HFA;VENTOLIN HFA) 108 (90 Base) MCG/ACT inhaler, Inhale 2 puffs into the lungs  every 6 (six) hours as needed for wheezing or shortness of breath., Disp: 1 Inhaler, Rfl: 0 .  ALPHAGAN P 0.1 % SOLN, Place 1 drop into both eyes 3 (three) times daily. , Disp: , Rfl: 3 .  ALPRAZolam (XANAX) 0.5 MG tablet, Take 1 tablet (0.5 mg total) by mouth at bedtime as needed for anxiety., Disp: 15 tablet, Rfl: 0 .  Amino Acids-Protein Hydrolys (FEEDING SUPPLEMENT, PRO-STAT SUGAR FREE 64,) LIQD, Take 30 mLs by mouth 2 (two) times daily., Disp: 887 mL, Rfl: 0 .  atorvastatin (LIPITOR) 80 MG tablet, TAKE 1 TABLET BY MOUTH  DAILY (Patient taking differently: Take 80 mg by mouth at bedtime. ), Disp: 90 tablet, Rfl: 1 .  ceFAZolin (ANCEF) IVPB,  Inject 2 g into the vein every 8 (eight) hours. Indication:  bacteremia  Last Day of Therapy:  10/12/19 Labs - Once weekly:  CBC/D and BMP, Labs - Every other week:  ESR and CRP, Disp: 33 Units, Rfl: 1 .  diphenhydrAMINE (BENADRYL) 25 MG tablet, Take 25 mg by mouth at bedtime as needed for sleep., Disp: , Rfl:  .  dofetilide (TIKOSYN) 500 MCG capsule, Take 1 capsule (500 mcg total) by mouth 2 (two) times daily., Disp: 180 capsule, Rfl: 2 .  famotidine (PEPCID) 10 MG tablet, Take 10 mg by mouth as needed for heartburn. , Disp: , Rfl:  .  fexofenadine (ALLEGRA) 180 MG tablet, Take 180 mg by mouth daily., Disp: , Rfl:  .  fish oil-omega-3 fatty acids 1000 MG capsule, Take 1 g by mouth 2 (two) times daily. , Disp: , Rfl:  .  fluticasone (FLONASE) 50 MCG/ACT nasal spray, Place 2 sprays into both nostrils daily as needed for allergies., Disp: 48 g, Rfl: prn .  fluticasone furoate-vilanterol (BREO ELLIPTA) 200-25 MCG/INH AEPB, Inhale 1 puff into the lungs daily as needed (shortness of breath). , Disp: , Rfl:  .  lidocaine (LIDODERM) 5 %, Place 2 patches onto the skin daily. Apply to lumbar spine at 7 am and remove at 7 pm daily, Disp: 60 patch, Rfl: 0 .  magnesium oxide (MAG-OX) 400 (241.3 Mg) MG tablet, Take 1 tablet (400 mg total) by mouth daily., Disp: 30 tablet,  Rfl: 0 .  Menthol-Methyl Salicylate (MUSCLE RUB) 10-15 % CREA, Apply 1 application topically as needed for muscle pain., Disp: , Rfl: 0 .  metFORMIN (GLUCOPHAGE-XR) 500 MG 24 hr tablet, Take 1 tablet (500 mg total) by mouth 3 (three) times daily., Disp: , Rfl:  .  methocarbamol (ROBAXIN) 500 MG tablet, Take 1 tablet (500 mg total) by mouth 4 (four) times daily as needed for muscle spasms., Disp: 45 tablet, Rfl: 0 .  MULTIPLE VITAMIN PO, Take 1 tablet by mouth every evening. , Disp: , Rfl:  .  nitroGLYCERIN (NITROSTAT) 0.4 MG SL tablet, Place 1 tablet (0.4 mg total) under the tongue every 5 (five) minutes as needed for chest pain., Disp: 25 tablet, Rfl: 12 .  ondansetron (ZOFRAN ODT) 4 MG disintegrating tablet, Take 1 tablet (4 mg total) by mouth every 8 (eight) hours as needed for nausea or vomiting., Disp: 24 tablet, Rfl: 0 .  ONETOUCH VERIO test strip, USE TWICE A DAY, Disp: 50 each, Rfl: 12 .  pantoprazole (PROTONIX) 40 MG tablet, Take 40 mg by mouth daily., Disp: , Rfl:  .  PARoxetine (PAXIL) 20 MG tablet, Take 1 tablet (20 mg total) by mouth at bedtime., Disp: 30 tablet, Rfl: 0 .  potassium chloride SA (KLOR-CON M20) 20 MEQ tablet, Take 1 tablet (20 mEq total) by mouth 2 (two) times daily., Disp: 60 tablet, Rfl: 0 .  PRADAXA 150 MG CAPS capsule, TAKE 1 CAPSULE BY MOUTH  EVERY 12 HOURS, Disp: 180 capsule, Rfl: 3 .  senna-docusate (SENOKOT-S) 8.6-50 MG tablet, Take 1 tablet by mouth 2 (two) times daily., Disp: 60 tablet, Rfl: 0 .  tamsulosin (FLOMAX) 0.4 MG CAPS capsule, TAKE 1 CAPSULE BY MOUTH EVERY DAY (Patient taking differently: Take 0.4 mg by mouth daily as needed (repeat kidney stone). ), Disp: 30 capsule, Rfl: 5 .  traMADol (ULTRAM) 50 MG tablet, Take 1 tablet (50 mg total) by mouth 4 (four) times daily as needed for severe pain., Disp: 28 tablet, Rfl: 0 .  triamcinolone cream (KENALOG) 0.1 %, Apply 1 application topically 2 (two) times daily. As needed. (Patient taking differently: Apply 1  application topically 2 (two) times daily as needed (rash). ), Disp: 30 g, Rfl: 0  EXAM: BP Readings from Last 3 Encounters:  10/02/19 120/80  09/15/19 125/67  08/20/19 120/69    VITALS per patient if applicable: GENERAL: alert, oriented, appears well and in no acute distress but  Has lost a good bit of wieght ans looks teired  HEENT: atraumatic, conjunttiva clear, no obvious abnormalities on inspection of external nose and ears NECK: normal movements of the head and neck LUNGS: on inspection no signs of respiratory distress, breathing rate appears normal, no obvious gross SOB, gasping or wheezing CV: no obvious cyanosis PSYCH/NEURO: pleasant and cooperative, , speech and thought processing grossly intact Lab Results  Component Value Date   WBC 8.8 09/30/2019   HGB 13.1 09/30/2019   HCT 38.3 (L) 09/30/2019   PLT 159 09/30/2019   GLUCOSE 142 (H) 09/30/2019   CHOL 85 02/08/2019   TRIG 110.0 02/08/2019   HDL 30.00 (L) 02/08/2019   LDLCALC 33 02/08/2019   ALT 25 09/30/2019   AST 38 09/30/2019   NA 137 09/30/2019   K 4.2 09/30/2019   CL 104 09/30/2019   CREATININE 0.86 09/30/2019   BUN 28 (H) 09/30/2019   CO2 25 09/30/2019   TSH 1.414 08/27/2019   PSA 1.12 07/21/2019   INR 1.2 08/31/2019   HGBA1C 6.7 (H) 07/21/2019   MICROALBUR 18.7 (H) 02/08/2019    ASSESSMENT AND PLAN:  Discussed the following assessment and plan:    ICD-10-CM   1. Hospital discharge follow-up  Z09   2. Sleep disorder  G47.9   3. Spinal stenosis, unspecified spinal region  M48.00   4. Type 2 diabetes mellitus with other circulatory complications (HCC)  O37.85   5. Discitis of lumbar region  M46.46   6. Septic arthritis of sternoclavicular joint, left (HCC)  M00.9   7. Protein-calorie malnutrition, unspecified severity ([place])  E46    post prolonged hospitalization  on supplement   Sleep discussion for now can use alprazolam and if not refilled  rehab we can help   Not a long term  Solutions .  But  reasonable from transition from hospital  Will last magnesium 1.9 will send info to Dr.  Stanford Breed  reveiwed labs with patient  In system   Keep appt 10/ 28 and can consdier virtual  Depending and can flex with the   his progress and schedule Contact us I interim for med transition  Help .  On going pain  Is the most probelmatice and debility needing   Assistance to walk independently .   Counseled.   Expectant management and discussion of plan and treatment with opportunity to ask questions and all were answered. The patient agreed with the plan and demonstrated an understanding of the instructions.   Advised to call back or seek an in-person evaluation if worsening  or having  further concerns .  Shanon Ace, MD

## 2019-10-11 ENCOUNTER — Telehealth (INDEPENDENT_AMBULATORY_CARE_PROVIDER_SITE_OTHER): Payer: Medicare Other | Admitting: Internal Medicine

## 2019-10-11 ENCOUNTER — Encounter: Payer: Self-pay | Admitting: Internal Medicine

## 2019-10-11 ENCOUNTER — Other Ambulatory Visit: Payer: Self-pay

## 2019-10-11 DIAGNOSIS — Z09 Encounter for follow-up examination after completed treatment for conditions other than malignant neoplasm: Secondary | ICD-10-CM | POA: Diagnosis not present

## 2019-10-11 DIAGNOSIS — E46 Unspecified protein-calorie malnutrition: Secondary | ICD-10-CM

## 2019-10-11 DIAGNOSIS — G479 Sleep disorder, unspecified: Secondary | ICD-10-CM | POA: Diagnosis not present

## 2019-10-11 DIAGNOSIS — M4646 Discitis, unspecified, lumbar region: Secondary | ICD-10-CM

## 2019-10-11 DIAGNOSIS — M009 Pyogenic arthritis, unspecified: Secondary | ICD-10-CM

## 2019-10-11 DIAGNOSIS — E1159 Type 2 diabetes mellitus with other circulatory complications: Secondary | ICD-10-CM | POA: Diagnosis not present

## 2019-10-11 DIAGNOSIS — M4800 Spinal stenosis, site unspecified: Secondary | ICD-10-CM

## 2019-10-12 ENCOUNTER — Other Ambulatory Visit: Payer: Self-pay

## 2019-10-12 ENCOUNTER — Encounter: Payer: Self-pay | Admitting: Infectious Diseases

## 2019-10-12 ENCOUNTER — Ambulatory Visit (INDEPENDENT_AMBULATORY_CARE_PROVIDER_SITE_OTHER): Payer: Medicare Other | Admitting: Infectious Diseases

## 2019-10-12 DIAGNOSIS — R7881 Bacteremia: Secondary | ICD-10-CM | POA: Diagnosis not present

## 2019-10-12 DIAGNOSIS — M4646 Discitis, unspecified, lumbar region: Secondary | ICD-10-CM | POA: Diagnosis not present

## 2019-10-12 DIAGNOSIS — R29818 Other symptoms and signs involving the nervous system: Secondary | ICD-10-CM | POA: Diagnosis not present

## 2019-10-12 DIAGNOSIS — B9561 Methicillin susceptible Staphylococcus aureus infection as the cause of diseases classified elsewhere: Secondary | ICD-10-CM

## 2019-10-12 DIAGNOSIS — M009 Pyogenic arthritis, unspecified: Secondary | ICD-10-CM | POA: Diagnosis not present

## 2019-10-12 DIAGNOSIS — Z452 Encounter for adjustment and management of vascular access device: Secondary | ICD-10-CM

## 2019-10-12 MED ORDER — CEPHALEXIN 500 MG PO CAPS: 500.0000 mg | ORAL_CAPSULE | Freq: Three times a day (TID) | ORAL | 0 refills | Status: AC

## 2019-10-12 NOTE — Patient Instructions (Addendum)
We can stop your antibiotics as they are currently scheduled.   I will give you a prescription of an oral antibiotic to take one capsule three times a day for 2 more weeks then stop antibiotics.   Please schedule an appointment with me again in 6 weeks to check in. We can do a video visit for you here if you like  Please make an appointment to see your neurosurgeon sometime in 4 weeks if you can to review your hospitalization.

## 2019-10-12 NOTE — Progress Notes (Signed)
Called [facility] and spoke with [name] and  Gave verbal orders per [name], NP to pull picc line today. Orders read back and understood.  [name] [name]

## 2019-10-12 NOTE — Progress Notes (Signed)
Patient: [name] [name].  DOB: 10-02-1951 MRN: [id number] PCP: Burnis Medin, MD    Chief Complaint  Patient presents with   Hospitalization Follow-up     Patient Active Problem List   Diagnosis Date Noted   MSSA bacteremia 08/30/2019    Priority: High   Discitis of lumbar region 08/30/2019    Priority: High   Septic arthritis of sternoclavicular joint, left ([place]) 08/30/2019    Priority: High   PICC (peripherally inserted central catheter) in place 10/18/2019   Elevated BUN    Postoperative pain    Controlled type 2 diabetes mellitus with hyperglycemia, without long-term current use of insulin ([place])    Right thalamic infarction ([place])    Hypoalbuminemia due to protein-calorie malnutrition ([place])    Diabetes mellitus type 2 in obese ([place])    Diskitis 09/15/2019   Left shoulder pain 08/30/2019   Myelopathy ([place]) 08/27/2019   H/O optic neuritis 08/27/2019   Acute back pain with sciatica, right 09/04/2017   Right hip pain 09/04/2017   Thrombocytopenia ([place]) 06/25/2016   Atrial fibrillation ([place]) 01/01/2016   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   Iliac aneurysm ([place])    Morbid obesity ([place]) 07/08/2014   Type 2 diabetes mellitus with other circulatory complications ([place]) 24/08/7352   Chronic anticoagulation 12/21/2013   Sleep disorder 06/21/2013   Decreased hearing 08/06/2012   Hemorrhoids 08/06/2012   High risk medication use 05/05/2012   Anticoagulation management encounter 05/05/2012   Retinal tear 04/29/2012   Personal history of colonic polyps 05/05/2011   Visit for preventive health examination 05/05/2011   PALPITATIONS 08/16/2010   LIVER FUNCTION TESTS, ABNORMAL 04/25/2010   Lateral epicondylitis 10/24/2009   TOBACCO USE, QUIT 10/24/2009   OBESITY, UNSPECIFIED 04/24/2009   Hyperlipidemia 03/22/2009   ERECTILE DYSFUNCTION 03/22/2009   Obstructive sleep apnea 03/22/2009   ULNAR NEUROPATHY, LEFT 03/22/2009    MYOCARDIAL INFARCTION, HX OF 03/22/2009   Coronary atherosclerosis 03/22/2009   Atrial fibrillation with RVR ([place]) 03/22/2009   GERD 03/22/2009     Subjective:  [name] [name]. is a 68 y.o. male here for hospital follow up.  PMHx diabetes, optic nerve neuritis.   Brief ID Hx:  Admitted 08/27/2019 with fevers, acutely worsened back pain and AMS. Blood cultures grew MSSA in all 4 bottles. 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 He was also found to have septic arthritis of the left sternoclavicular joint, for which he underwent debridement 9/01 with Dr. Servando Snare.  There was concern for contiguous meningitis by neurology team; he was treated with 2 weeks Nafcillin then transitioned to cefazolin to complete 6 weeks of IV therapy through 10/12/2019. He required inpatient rehab prior to discharge home.    CC:  HFU for discitis and [place] joint septic arthritis.    HPI:  Feeling much better. He does not recall much of his earlier stay at the hospital. He says that inpatient rehab was so helpful to get him moving again. He still wears his back brace every day in observation of the ongoing pain he still has.  He has continued his cefazolin three times a day without a missed dose. No fevers/chills, rashes, abdominal pain or diarrhea. PICC line is without pain, drainage or erythema and is well maintained by [facility] Team. No swelling or altered sensation in affected distal extremity.   Regarding his back pain, he needs to use tramadol + robaxin three times a day to be "pain free". Wears his back brace  all the time. Hardest part of his day is rolling out of bed and needs pain medication prior to this. He originally had follow up with his neurosurgeon but his hospitalization caused him to miss this appointment. 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 He is able to walk at home with a cane, comes to clinic in a wheelchair today.   His left [place] joint still has wound  vac in place. Home health nurse came out to change dressing last week and reported to have a "waxy white hard ball" in the wound bed. No bleeding, no drainage, no odor. They called the TCTS office and was told "it can happen."   He has had trouble with depression during his hospital stay for which he was started on Paxil. His mood has improved significantly since release home and he continues on this with follow up with his PCP.    Review of Systems  Constitutional: Negative for chills, fever and malaise/fatigue.  Respiratory: Negative for cough and shortness of breath.   Cardiovascular: Negative for chest pain and leg swelling.  Gastrointestinal: Negative for abdominal pain, diarrhea and vomiting.  Genitourinary: Negative for dysuria.  Musculoskeletal: Positive for back pain. Negative for falls and joint pain.  Skin: Negative for rash.  Neurological: Negative for dizziness and headaches.  Psychiatric/Behavioral: Positive for depression.    Past Medical History:  Diagnosis Date   Atrial fibrillation ([place]) 03/22/2009   a. s/p multiple DCCV; b. no coumadin due to low TE risk profile; c. Tikosyn Rx   Coronary atherosclerosis of native coronary artery 11/2002   a. s/p stent to LAD 12/03; OM2 occluded at cath 12/03; d. myoview 5/10: no ischemia;  e. echo 7/11: EF 55%, BAE, mild RVE, PASP 41-45; Myoview was in March 2013. There was no ischemia or infarction, EF 51%    Cutaneous abscess of back excluding buttocks 07/04/2014   Appears to stem from possibly a cyst very large area 6 cm contact surgeon office    Diabetes mellitus without complication ([place])    Drusen body    see opth note   ERECTILE DYSFUNCTION 03/22/2009   GERD 03/22/2009   HYPERGLYCEMIA 04/25/2010   HYPERLIPIDEMIA 03/22/2009   Iliac aneurysm ([place])    2.6 to be evaluated incidental finding on CT   LATERAL EPICONDYLITIS, LEFT 10/24/2009   LIVER FUNCTION TESTS, ABNORMAL 04/25/2010   Local reaction to immunization 05/05/2012    minor resolving  zostavax    Myocardial infarction [facility]) mi2003   Numbness in left leg    foot related to back disease and surgery   Obesity, unspecified 04/24/2009   Perforated appendicitis with necrosis s/p open appendectomy 06/07/14 06/04/2014   Renal cyst    Characterized by MRI as simple   Ruptured suppurative appendicitis    2015    SLEEP APNEA, OBSTRUCTIVE 03/22/2009   compliant with CPAP   THROMBOCYTOPENIA 08/16/2010   TOBACCO USE, QUIT 10/24/2009   ULNAR NEUROPATHY, LEFT 04/30/7740   Umbilical hernia     Outpatient Medications Prior to Visit  Medication Sig Dispense Refill   acetaminophen (TYLENOL) 325 MG tablet Take 650 mg by mouth every 6 (six) hours as needed for mild pain.     albuterol (PROVENTIL HFA;VENTOLIN HFA) 108 (90 Base) MCG/ACT inhaler Inhale 2 puffs into the lungs every 6 (six) hours as needed for wheezing or shortness of breath. 1 Inhaler 0   ALPHAGAN P 0.1 % SOLN Place 1 drop into both eyes 3 (three) times daily.   3  ALPRAZolam (XANAX) 0.5 MG tablet Take 1 tablet (0.5 mg total) by mouth at bedtime as needed for anxiety. 15 tablet 0   Amino Acids-Protein Hydrolys (FEEDING SUPPLEMENT, PRO-STAT SUGAR FREE 64,) LIQD Take 30 mLs by mouth 2 (two) times daily. 887 mL 0   atorvastatin (LIPITOR) 80 MG tablet TAKE 1 TABLET BY MOUTH  DAILY (Patient taking differently: Take 80 mg by mouth at bedtime. ) 90 tablet 1   diphenhydrAMINE (BENADRYL) 25 MG tablet Take 25 mg by mouth at bedtime as needed for sleep.     dofetilide (TIKOSYN) 500 MCG capsule Take 1 capsule (500 mcg total) by mouth 2 (two) times daily. 180 capsule 2   famotidine (PEPCID) 10 MG tablet Take 10 mg by mouth as needed for heartburn.      fexofenadine (ALLEGRA) 180 MG tablet Take 180 mg by mouth daily.     fish oil-omega-3 fatty acids 1000 MG capsule Take 1 g by mouth 2 (two) times daily.      fluticasone (FLONASE) 50 MCG/ACT nasal spray Place 2 sprays into both nostrils daily as needed  for allergies. 48 g prn   fluticasone furoate-vilanterol (BREO ELLIPTA) 200-25 MCG/INH AEPB Inhale 1 puff into the lungs daily as needed (shortness of breath).      lidocaine (LIDODERM) 5 % Place 2 patches onto the skin daily. Apply to lumbar spine at 7 am and remove at 7 pm daily 60 patch 0   magnesium oxide (MAG-OX) 400 (241.3 Mg) MG tablet Take 1 tablet (400 mg total) by mouth daily. 30 tablet 0   Menthol-Methyl Salicylate (MUSCLE RUB) 10-15 % CREA Apply 1 application topically as needed for muscle pain.  0   metFORMIN (GLUCOPHAGE-XR) 500 MG 24 hr tablet Take 1 tablet (500 mg total) by mouth 3 (three) times daily.     MULTIPLE VITAMIN PO Take 1 tablet by mouth every evening.      nitroGLYCERIN (NITROSTAT) 0.4 MG SL tablet Place 1 tablet (0.4 mg total) under the tongue every 5 (five) minutes as needed for chest pain. 25 tablet 12   ondansetron (ZOFRAN ODT) 4 MG disintegrating tablet Take 1 tablet (4 mg total) by mouth every 8 (eight) hours as needed for nausea or vomiting. 24 tablet 0   ONETOUCH VERIO test strip USE TWICE A DAY 50 each 12   pantoprazole (PROTONIX) 40 MG tablet Take 40 mg by mouth daily.     PARoxetine (PAXIL) 20 MG tablet Take 1 tablet (20 mg total) by mouth at bedtime. 30 tablet 0   potassium chloride SA (KLOR-CON M20) 20 MEQ tablet Take 1 tablet (20 mEq total) by mouth 2 (two) times daily. 60 tablet 0   PRADAXA 150 MG CAPS capsule TAKE 1 CAPSULE BY MOUTH  EVERY 12 HOURS 180 capsule 3   senna-docusate (SENOKOT-S) 8.6-50 MG tablet Take 1 tablet by mouth 2 (two) times daily. 60 tablet 0   tamsulosin (FLOMAX) 0.4 MG CAPS capsule TAKE 1 CAPSULE BY MOUTH EVERY DAY (Patient taking differently: Take 0.4 mg by mouth daily as needed (repeat kidney stone). ) 30 capsule 5   traMADol (ULTRAM) 50 MG tablet Take 1 tablet (50 mg total) by mouth 4 (four) times daily as needed for severe pain. 28 tablet 0   triamcinolone cream (KENALOG) 0.1 % Apply 1 application topically 2 (two)  times daily. As needed. (Patient taking differently: Apply 1 application topically 2 (two) times daily as needed (rash). ) 30 g 0   ceFAZolin (ANCEF) IVPB Inject 2  g into the vein every 8 (eight) hours. Indication:  bacteremia  Last Day of Therapy:  10/12/19 Labs - Once weekly:  CBC/D and BMP, Labs - Every other week:  ESR and CRP 33 Units 1   methocarbamol (ROBAXIN) 500 MG tablet Take 1 tablet (500 mg total) by mouth 4 (four) times daily as needed for muscle spasms. 45 tablet 0   No facility-administered medications prior to visit.      Allergies  Allergen Reactions   Pseudoephedrine Other (See Comments)    Patient went into afib   Diltiazem Rash and Itching    Also 2019   Novocain [Procaine] Hives    Dentist appointment in 1968; since then has tolerated lidocaine and provocaine with no hives or difficulty.   Quinolones     Patient was warned about not using Cipro and similar antibiotics. Recent studies have raised concern that fluoroquinolone antibiotics could be associated with an increased risk of aortic aneurysm Fluoroquinolones have non-antimicrobial properties that might jeopardise the integrity of the extracellular matrix of the vascular wall In a  propensity score matched cohort study in [place], there was a 66% increased rate of aortic aneurysm or dissection associated with oral fluoroquinolone use, compared wit   Pseudoephedrine Hcl Palpitations   Sulfonamide Derivatives Other (See Comments)    Childhood reaction     Social History   Tobacco Use   Smoking status: Former Smoker    Packs/day: 2.00    Years: 42.00    Pack years: 84.00    Types: Cigarettes    Quit date: 12/30/2008    Years since quitting: 10.8   Smokeless tobacco: Never Used   Tobacco comment: started at age 68; 1-2 ppd; quit 2010  Substance Use Topics   Alcohol use: No    Alcohol/week: 0.0 standard drinks    Comment: rarely; maybe 1 beer a year   Drug use: No    Family History    Problem Relation Age of Onset   Thyroid disease Mother    Ovarian cancer Mother    Breast cancer Mother    Lung cancer Father    Cancer Father     Objective:   Vitals:   10/12/19 1410  BP: 108/65  Pulse: 71  Temp: 98 F (36.7 C)   There is no height or weight on file to calculate BMI.  Physical Exam Constitutional:      Appearance: He is well-developed.     Comments: Seated in wheelchair. Appears comfortable during the visit.   HENT:     Mouth/Throat:     Dentition: Normal dentition. No dental abscesses.  Cardiovascular:     Rate and Rhythm: Normal rate and regular rhythm.     Heart sounds: Normal heart sounds.  Pulmonary:     Effort: Pulmonary effort is normal.     Breath sounds: Normal breath sounds.  Chest:       Comments: Wound vac in place. Clean and dry. No drainage in cannister tubing.  Abdominal:     General: There is no distension.     Palpations: Abdomen is soft.     Tenderness: There is no abdominal tenderness.  Musculoskeletal:     Comments: Wearing a back brace and seated in wheelchair. Did not observe gait.   Lymphadenopathy:     Cervical: No cervical adenopathy.  Skin:    General: Skin is warm and dry.     Findings: No rash.  Neurological:     Mental Status: He is alert  and oriented to person, place, and time.  Psychiatric:        Judgment: Judgment normal.     Comments: In good spirits today.      Lab Results: Lab Results  Component Value Date   WBC 8.8 09/30/2019   HGB 13.1 09/30/2019   HCT 38.3 (L) 09/30/2019   MCV 93.4 09/30/2019   PLT 159 09/30/2019    Lab Results  Component Value Date   CREATININE 0.86 09/30/2019   BUN 28 (H) 09/30/2019   NA 137 09/30/2019   K 4.2 09/30/2019   CL 104 09/30/2019   CO2 25 09/30/2019    Lab Results  Component Value Date   ALT 25 09/30/2019   AST 38 09/30/2019   ALKPHOS 89 09/30/2019   BILITOT 0.8 09/30/2019     Assessment & Plan:   Problem List Items Addressed This Visit       High   MSSA bacteremia    Secondary to acute discitis/osteomyelitis of L spine and septic arthritis L [place] joint. Endocarditis assessment during inpatient stay was negative. No signs of relapsing bacteremia today. Continue current treatment plan.       Discitis of lumbar region    Pain has improved significantly per his report. Still requiring TID pain relief although given his significant underlying pathology with multilevel foraminal stenosis I would suspect he is quite uncomfortable. Will get him in to see neurosurgeon to follow up on the above. His inflammatory markers have remained flat during treatment and were not elevated at the time of his admission.  Will continue with 2 more weeks of PO cephalexin 500 mg TID and then have him stop. He will follow up with me again in 6 weeks. Advised to call sooner if he experiences relapsing fevers otherwise unexplained or worsening back pain once we stop his antibiotics.       Septic arthritis of sternoclavicular joint, left (HCC)    He has follow up with Dr. Servando Snare soon. Wound vac is in place and clean/dry without erythema. The photo he showed me looks almost like a calcium stone; I asked him to please show the photo to Dr. Servando Snare at his follow up to ensure all is well from surgical recovery.       Relevant Medications   cephALEXin (KEFLEX) 500 MG capsule   RESOLVED: Suspected infectious meningitis    Resolved with adequate treatment.         Unprioritized   PICC (peripherally inserted central catheter) in place    Will call his home health team and have them D/C line after today's dose.          [name], MSN, NP-C [facility] for [facility] [place] Pager: [phone] Office: [phone]  10/18/19  2:31 PM

## 2019-10-13 ENCOUNTER — Encounter: Payer: Self-pay | Admitting: Physical Medicine and Rehabilitation

## 2019-10-13 ENCOUNTER — Encounter
Payer: Medicare Other | Attending: Physical Medicine and Rehabilitation | Admitting: Physical Medicine and Rehabilitation

## 2019-10-13 ENCOUNTER — Other Ambulatory Visit: Payer: Self-pay

## 2019-10-13 ENCOUNTER — Other Ambulatory Visit: Payer: Self-pay | Admitting: Cardiothoracic Surgery

## 2019-10-13 VITALS — BP 117/70 | HR 63 | Temp 97.5°F | Ht 73.0 in | Wt 235.0 lb

## 2019-10-13 DIAGNOSIS — G8918 Other acute postprocedural pain: Secondary | ICD-10-CM

## 2019-10-13 DIAGNOSIS — G959 Disease of spinal cord, unspecified: Secondary | ICD-10-CM | POA: Diagnosis present

## 2019-10-13 DIAGNOSIS — B9561 Methicillin susceptible Staphylococcus aureus infection as the cause of diseases classified elsewhere: Secondary | ICD-10-CM

## 2019-10-13 DIAGNOSIS — M5441 Lumbago with sciatica, right side: Secondary | ICD-10-CM

## 2019-10-13 DIAGNOSIS — R7881 Bacteremia: Secondary | ICD-10-CM

## 2019-10-13 MED ORDER — TRAZODONE HCL 50 MG PO TABS: 50.0000 mg | ORAL_TABLET | Freq: Every day | ORAL | 5 refills | Status: DC

## 2019-10-13 MED ORDER — METHOCARBAMOL 500 MG PO TABS: 500.0000 mg | ORAL_TABLET | Freq: Four times a day (QID) | ORAL | 2 refills | Status: DC | PRN

## 2019-10-13 MED ORDER — TRAMADOL HCL 50 MG PO TABS: 50.0000 mg | ORAL_TABLET | Freq: Four times a day (QID) | ORAL | 1 refills | Status: DC | PRN

## 2019-10-13 NOTE — Patient Instructions (Signed)
Patient is a 68 yr old male with A fib, sepsis of  joint, DM, osteomyelitis, diskitis, and post op pain here for f/u.   1. Suggest pushing your back forward- like pregnant- multiple times per day for back stretching  2. Needs PICC line out- if issues, let me know and I can order the discontinuation  3. Con't PT and OT- for as long as necessary- do Home exercise program- at least 5 days/week.  4. Has access to handicapped placard.  5. Have to check with Neurosurgery about driving- would work on coordination before driving again.  6. Refill tramadol 50 mg QID prn #120 with 1 RF and Robaxin 500 mg QID prn for muscle relaxation.  7. Wean Paxil if want to come off- suggest Paxil at least 3 months before weaning.  8. Sleep isn't going as well- trazodone 25-50 mg nightly as needed for sleep.  9. F/U in 2 months- earlier if needed; call if necessary-   10. If needs tramadol past 2 months, will need controlled substances contract and UDS at that point,

## 2019-10-13 NOTE — Progress Notes (Signed)
Subjective:    Patient ID: [name] [name]., male    DOB: [birth year], 68 y.o.   MRN: [id number]  HPI  Patient is a 68 yr old male with A fib, sepsis of [place] joint, DM, osteomyelitis, diskitis, and post op pain here for f/u.   CC: Bacteremia and diskitis/osteomyelitis    PICC line still in-- done with IV ABX- now on Oral ABX- Keflex- started last night;  Still has wound VAC-supposed to see Cardiothoracic surgery tomorrow to see if wound VAC can come off- still wearing TLSO- helps back pain.   Likes the combo of Tramadol and Robaxin- no nausea, confusion, being out of it on combo- alternates between RW and w/c.  Uses w/c if in kitchen. Friend built a platform to Dentist- can now get in/out of it.  Can walk with RW-  Did PT this AM- walked 120 ft this AM- didn't take a break-  OT- saw them once last week- sees tomorrow-suggested pelvic tilt-   "Oldtimers" have cooked him dinner for a total of 2+ weeks.  Doesn't have clearance to drive yet. Did have appointment to [facility]- since was in hospital- has to get a new appointment.   Saw [name]- NP- for ID yesterday [facility]- tomorrow Needs [facility] appointment  Did a virtual visit with Family Medicine Monday- Dr Regis Bill  Supposed to get PICC out- hasn't as of yet- wrapping with saran wrap and wound site with saran wrap to shower. Using tub transfer bench.  On Paxil- working- taking at night- takes Xanax at night. Willing to Try trazodone for sleep. Needs more.  Pain Inventory Average Pain 7 Pain Right Now 7 My pain is constant, sharp, burning and stabbing  In the last 24 hours, has pain interfered with the following? General activity 7 Relation with others 8 Enjoyment of life 10 What TIME of day is your pain at its worst? daytime Sleep (in general) Fair  Pain is worse with: bending Pain improves with: rest and medication Relief from Meds: 8  Mobility walk with assistance use a walker ability to climb steps?  yes  do you drive?  no use a wheelchair  Function retired I need assistance with the following:  dressing, bathing, household duties and shopping  Neuro/Psych weakness trouble walking spasms anxiety  Prior Studies Any changes since last visit?  no  Physicians involved in your care Any changes since last visit?  no   Family History  Problem Relation Age of Onset  . Thyroid disease Mother   . Ovarian cancer Mother   . Breast cancer Mother   . Lung cancer Father   . Cancer Father    Social History   Socioeconomic History  . Marital status: Married    Spouse name: Not on file  . Number of children: Not on file  . Years of education: Not on file  . Highest education level: Not on file  Occupational History  . Not on file  Social Needs  . Financial resource strain: Not on file  . Food insecurity    Worry: Not on file    Inability: Not on file  . Transportation needs    Medical: Not on file    Non-medical: Not on file  Tobacco Use  . Smoking status: Former Smoker    Packs/day: 2.00    Years: 42.00    Pack years: 84.00    Types: Cigarettes    Quit date: 12/30/2008    Years since quitting: 10.7  .  Smokeless tobacco: Never Used  . Tobacco comment: started at age 51; 1-2 ppd; quit 2010  Substance and Sexual Activity  . Alcohol use: No    Alcohol/week: 0.0 standard drinks    Comment: rarely; maybe 1 beer a year  . Drug use: No  . Sexual activity: Yes  Lifestyle  . Physical activity    Days per week: Not on file    Minutes per session: Not on file  . Stress: Not on file  Relationships  . Social Herbalist on phone: Not on file    Gets together: Not on file    Attends religious service: Not on file    Active member of club or organization: Not on file    Attends meetings of clubs or organizations: Not on file    Relationship status: Not on file  Other Topics Concern  . Not on file  Social History Narrative   Retired paramedic   Regular exercise-yes  not recently    Has children   Wife is overweight and has fibromyalgia and depression on disability doesn't go out much. Back surgery    Has older dog   Retired from [employer] 31 years of service now 7 years    Caffeine- minimal   Past Surgical History:  Procedure Laterality Date  . APPENDECTOMY N/A 06/06/2014   Procedure: APPENDECTOMY;  Surgeon: Odis Hollingshead, MD;  Location: [facility];  Service: General;  Laterality: N/A;  . COLON SURGERY    . COLONOSCOPY  11/15/2011   Procedure: COLONOSCOPY;  Surgeon: Juanita Craver, MD;  Location: [facility];  Service: Endoscopy;  Laterality: N/A;  . COLONOSCOPY WITH PROPOFOL N/A 01/03/2017   Procedure: COLONOSCOPY WITH PROPOFOL;  Surgeon: Carol Ada, MD;  Location: [facility];  Service: Endoscopy;  Laterality: N/A;  . cyst on epiglottis  08/2002  . ESOPHAGOGASTRODUODENOSCOPY  11/15/2011   Procedure: ESOPHAGOGASTRODUODENOSCOPY (EGD);  Surgeon: Juanita Craver, MD;  Location: [facility];  Service: Endoscopy;  Laterality: N/A;  . LAPAROSCOPIC APPENDECTOMY N/A 06/06/2014   Procedure: APPENDECTOMY LAPAROSCOPIC attemted;  Surgeon: Odis Hollingshead, MD;  Location: [facility];  Service: General;  Laterality: N/A;  . LUMBAR DISC SURGERY     two  holes in spinalcovering  . stent     LAD DUS 2004  . STERNAL WOUND DEBRIDEMENT Left 08/31/2019   Procedure: LEFT STERNOCLAVICULAR WOUND DEBRIDEMENT WITH APPLICATION OF WOUND VAC;  Surgeon: Grace Isaac, MD;  Location: [place];  Service: Thoracic;  Laterality: Left;  . surgery l4-l5   1998   ruptured x 3  . ulnar neuropathy    . UMBILICAL HERNIA REPAIR     mesh   Past Medical History:  Diagnosis Date  . Atrial fibrillation ([place]) 03/22/2009   a. s/p multiple DCCV; b. no coumadin due to low TE risk profile; c. Tikosyn Rx  . Coronary atherosclerosis of native coronary artery 11/2002   a. s/p stent to LAD 12/03; OM2 occluded at cath 12/03; d. myoview 5/10: no ischemia;  e. echo 7/11: EF 55%, BAE, mild RVE, PASP 41-45;  Myoview was in March 2013. There was no ischemia or infarction, EF 51%   . Cutaneous abscess of back excluding buttocks 07/04/2014   Appears to stem from possibly a cyst very large area 6 cm contact surgeon office   . Diabetes mellitus without complication ([place])   . Drusen body    see opth note  . ERECTILE DYSFUNCTION 03/22/2009  . GERD 03/22/2009  . HYPERGLYCEMIA 04/25/2010  .  HYPERLIPIDEMIA 03/22/2009  . 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 LATERAL EPICONDYLITIS, LEFT 10/24/2009  . LIVER FUNCTION TESTS, ABNORMAL 04/25/2010  . Local reaction to immunization 05/05/2012   minor resolving  zostavax   . Myocardial infarction ([place]) mi2003  . Numbness in left leg    foot related to back disease and surgery  . Obesity, unspecified 04/24/2009  . Perforated appendicitis with necrosis s/p open appendectomy 06/07/14 06/04/2014  . Renal cyst    Characterized by MRI as simple  . Ruptured suppurative appendicitis    2015   . SLEEP APNEA, OBSTRUCTIVE 03/22/2009   compliant with CPAP  . THROMBOCYTOPENIA 08/16/2010  . TOBACCO USE, QUIT 10/24/2009  . 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 Umbilical hernia    BP [id number]   Pulse 63   Temp (!) 97.5 F (36.4 C)   Ht 6\' 1"  (1.854 m)   Wt 235 lb (106.6 kg)   SpO2 93%   BMI 31.00 kg/m   Opioid Risk Score:   Fall Risk Score:  `1  Depression screen PHQ 2/9  Depression screen [facility] 2/9 10/12/2019 07/21/2019 08/11/2018 08/08/2017 06/27/2017 06/25/2016 05/09/2015  Decreased Interest 0 0 0 0 0 0 0  Down, Depressed, Hopeless 0 0 0 0 0 0 0  PHQ - 2 Score 0 0 0 0 0 0 0  Some recent data might be hidden     Review of Systems  Constitutional: Negative.   HENT: Negative.   Eyes: Negative.   Respiratory: Negative.   Cardiovascular: Negative.   Gastrointestinal: Negative.   Endocrine: Negative.   Genitourinary: Negative.   Musculoskeletal: Positive for arthralgias, gait problem and myalgias.  Skin: Negative.   Allergic/Immunologic:  Negative.   Neurological: Positive for weakness.  Hematological: Negative.   Psychiatric/Behavioral: The patient is nervous/anxious.   All other systems reviewed and are negative.      Objective:   Physical Exam  Awake, alert, appropriate, wearing LSO , has wound VAC in place, in manual w/c, NAD TTP over R biceps tendon at anterior shoulder on R  MS: RLE- 5-/5 R HF, KE/KF, and 5/5 in DF and PF LLE- 4+ L HF, KE, and 5-/5 KF, and 5/5 in DF and PF   Neuro: intact sensation to LT in LEs B/L Skin: L clavicle area- wound VAC attached,  A lot less swollen/edematous than when saw 2 weeks ago.       Assessment & Plan:   Patient is a 68 yr old male with A fib, sepsis of [place] joint, DM, osteomyelitis, diskitis, and post op pain here for f/u.   1. Suggest pushing your back forward- like pregnant- multiple times per day for back stretching  2. Needs PICC line out- if issues, let me know and I can order the discontinuation  3. Con't PT and OT- for as long as necessary- do Home exercise program- at least 5 days/week.  4. Has access to handicapped placard.  5. Have to check with Neurosurgery about driving- would work on coordination before driving again.  6. Refill tramadol 50 mg QID prn #120 with 1 RF and Robaxin 500 mg QID prn for muscle relaxation.  7. Wean Paxil if want to come off- suggest Paxil at least 3 months before weaning.  8. Sleep isn't going as well- trazodone 25-50 mg nightly as needed for sleep.  9. F/U in 2 months- earlier if needed; call if necessary-   I have spent 40 minutes  on appointment- more than 25 minutes going over plan as detailed above.

## 2019-10-14 ENCOUNTER — Ambulatory Visit
Admission: RE | Admit: 2019-10-14 | Discharge: 2019-10-14 | Disposition: A | Discharge status: Home / Self Care | Payer: Medicare Other | Source: Ambulatory Visit | Attending: Cardiothoracic Surgery | Admitting: Cardiothoracic Surgery

## 2019-10-14 ENCOUNTER — Ambulatory Visit: Payer: Self-pay | Admitting: Cardiothoracic Surgery

## 2019-10-14 ENCOUNTER — Other Ambulatory Visit: Payer: Self-pay | Admitting: Cardiothoracic Surgery

## 2019-10-14 ENCOUNTER — Encounter: Payer: Self-pay | Admitting: Cardiothoracic Surgery

## 2019-10-14 VITALS — BP 129/64 | HR 56 | Temp 97.3°F | Resp 16 | Ht 73.0 in | Wt 235.0 lb

## 2019-10-14 DIAGNOSIS — M009 Pyogenic arthritis, unspecified: Secondary | ICD-10-CM

## 2019-10-14 DIAGNOSIS — Z09 Encounter for follow-up examination after completed treatment for conditions other than malignant neoplasm: Secondary | ICD-10-CM

## 2019-10-14 NOTE — Progress Notes (Signed)
[address]       [place] [address]             [phone]      [name] [name]. [place] Record [id number] Date of Birth: October 20, 1951  Referring: Aline August, MD Primary Care: Burnis Medin, MD Primary Cardiologist: No primary care provider on file.   Chief Complaint:   POST OP FOLLOW UP OPERATIVE REPORT DATE OF PROCEDURE:  08/31/2019 PREOPERATIVE DIAGNOSIS:  Infected left sternoclavicular joint. POSTOPERATIVE DIAGNOSIS:  Infected left sternoclavicular joint. SURGICAL PROCEDURE:  Incision and drainage and debridement of left sternoclavicular joint with application of wound VAC.  History of Present Illness:     Patient returns to the office today after prolonged hospitalization and time in rehab when he presented with underlying sepsis infected sternoclavicular joint which was debrided on September 1.  Patient has been on appropriate directed course of IV antibiotics just recently stopped iodine with transition to p.o. Keflex.  The home nurse was to come to his house today to remove the PICC line which we we will do in the office today.  Overall the patient seems to be making progress from a mobility standpoint he is now at home and able to carry out most activities of daily living.     Past Medical History:  Diagnosis Date  . Atrial fibrillation ([place]) 03/22/2009   a. s/p multiple DCCV; b. no coumadin due to low TE risk profile; c. Tikosyn Rx  . Coronary atherosclerosis of native coronary artery 11/2002   a. s/p stent to LAD 12/03; OM2 occluded at cath 12/03; d. myoview 5/10: no ischemia;  e. echo 7/11: EF 55%, BAE, mild RVE, PASP 41-45; Myoview was in March 2013. There was no ischemia or infarction, EF 51%   . Cutaneous abscess of back excluding buttocks 07/04/2014   Appears to stem from possibly a cyst very large area 6 cm contact surgeon office   . Diabetes mellitus without complication ([place])   . Drusen body    see opth note  .  ERECTILE DYSFUNCTION 03/22/2009  . GERD 03/22/2009  . HYPERGLYCEMIA 04/25/2010  . HYPERLIPIDEMIA 03/22/2009  . 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 LATERAL EPICONDYLITIS, LEFT 10/24/2009  . LIVER FUNCTION TESTS, ABNORMAL 04/25/2010  . Local reaction to immunization 05/05/2012   minor resolving  zostavax   . Myocardial infarction ([place]) mi2003  . Numbness in left leg    foot related to back disease and surgery  . Obesity, unspecified 04/24/2009  . Perforated appendicitis with necrosis s/p open appendectomy 06/07/14 06/04/2014  . Renal cyst    Characterized by MRI as simple  . Ruptured suppurative appendicitis    2015   . SLEEP APNEA, OBSTRUCTIVE 03/22/2009   compliant with CPAP  . THROMBOCYTOPENIA 08/16/2010  . TOBACCO USE, QUIT 10/24/2009  . 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 Umbilical hernia      Social History   Tobacco Use  Smoking Status Former Smoker  . Packs/day: 2.00  . Years: 42.00  . Pack years: 84.00  . Types: Cigarettes  . Quit date: 12/30/2008  . Years since quitting: 10.7  Smokeless Tobacco Never Used  Tobacco Comment   started at age 43; 1-2 ppd; quit 2010    Social History   Substance and Sexual Activity  Alcohol Use No  . Alcohol/week: 0.0 standard drinks   Comment: rarely; maybe 1 beer a year  Allergies  Allergen Reactions  . Pseudoephedrine Other (See Comments)    Patient went into afib  . Diltiazem Rash and Itching    Also 2019  . Novocain [Procaine] Hives    Dentist appointment in 1968; since then has tolerated lidocaine and provocaine with no hives or difficulty.  . Quinolones     Patient was warned about not using Cipro and similar antibiotics. Recent studies have raised concern that fluoroquinolone antibiotics could be associated with an increased risk of aortic aneurysm Fluoroquinolones have non-antimicrobial properties that might jeopardise the integrity of the extracellular matrix of the vascular wall In a   propensity score matched cohort study in [place], there was a 66% increased rate of aortic aneurysm or dissection associated with oral fluoroquinolone use, compared wit  . Pseudoephedrine Hcl Palpitations  . Sulfonamide Derivatives Other (See Comments)    Childhood reaction     Current Outpatient Medications  Medication Sig Dispense Refill  . acetaminophen (TYLENOL) 325 MG tablet Take 650 mg by mouth every 6 (six) hours as needed for mild pain.    [name] albuterol (PROVENTIL HFA;VENTOLIN HFA) 108 (90 Base) MCG/ACT inhaler Inhale 2 puffs into the lungs every 6 (six) hours as needed for wheezing or shortness of breath. 1 Inhaler 0  . ALPHAGAN P 0.1 % SOLN Place 1 drop into both eyes 3 (three) times daily.   3  . ALPRAZolam (XANAX) 0.5 MG tablet Take 1 tablet (0.5 mg total) by mouth at bedtime as needed for anxiety. 15 tablet 0  . Amino Acids-Protein Hydrolys (FEEDING SUPPLEMENT, PRO-STAT SUGAR FREE 64,) LIQD Take 30 mLs by mouth 2 (two) times daily. 887 mL 0  . atorvastatin (LIPITOR) 80 MG tablet TAKE 1 TABLET BY MOUTH  DAILY (Patient taking differently: Take 80 mg by mouth at bedtime. ) 90 tablet 1  . cephALEXin (KEFLEX) 500 MG capsule Take 1 capsule (500 mg total) by mouth 3 (three) times daily for 14 days. 42 capsule 0  . diphenhydrAMINE (BENADRYL) 25 MG tablet Take 25 mg by mouth at bedtime as needed for sleep.    [name] dofetilide (TIKOSYN) 500 MCG capsule Take 1 capsule (500 mcg total) by mouth 2 (two) times daily. 180 capsule 2  . famotidine (PEPCID) 10 MG tablet Take 10 mg by mouth as needed for heartburn.     . fexofenadine (ALLEGRA) 180 MG tablet Take 180 mg by mouth daily.    . fish oil-omega-3 fatty acids 1000 MG capsule Take 1 g by mouth 2 (two) times daily.     . fluticasone (FLONASE) 50 MCG/ACT nasal spray Place 2 sprays into both nostrils daily as needed for allergies. 48 g prn  . fluticasone furoate-vilanterol (BREO ELLIPTA) 200-25 MCG/INH AEPB Inhale 1 puff into the lungs daily as needed  (shortness of breath).     . lidocaine (LIDODERM) 5 % Place 2 patches onto the skin daily. Apply to lumbar spine at 7 am and remove at 7 pm daily 60 patch 0  . magnesium oxide (MAG-OX) 400 (241.3 Mg) MG tablet Take 1 tablet (400 mg total) by mouth daily. 30 tablet 0  . Menthol-Methyl Salicylate (MUSCLE RUB) 10-15 % CREA Apply 1 application topically as needed for muscle pain.  0  . metFORMIN (GLUCOPHAGE-XR) 500 MG 24 hr tablet Take 1 tablet (500 mg total) by mouth 3 (three) times daily.    . methocarbamol (ROBAXIN) 500 MG tablet Take 1 tablet (500 mg total) by mouth 4 (four) times daily as needed for muscle spasms.  120 tablet 2  . MULTIPLE VITAMIN PO Take 1 tablet by mouth every evening.     . nitroGLYCERIN (NITROSTAT) 0.4 MG SL tablet Place 1 tablet (0.4 mg total) under the tongue every 5 (five) minutes as needed for chest pain. 25 tablet 12  . ondansetron (ZOFRAN ODT) 4 MG disintegrating tablet Take 1 tablet (4 mg total) by mouth every 8 (eight) hours as needed for nausea or vomiting. 24 tablet 0  . ONETOUCH VERIO test strip USE TWICE A DAY 50 each 12  . pantoprazole (PROTONIX) 40 MG tablet Take 40 mg by mouth daily.    [name] PARoxetine (PAXIL) 20 MG tablet Take 1 tablet (20 mg total) by mouth at bedtime. 30 tablet 0  . potassium chloride SA (KLOR-CON M20) 20 MEQ tablet Take 1 tablet (20 mEq total) by mouth 2 (two) times daily. 60 tablet 0  . PRADAXA 150 MG CAPS capsule TAKE 1 CAPSULE BY MOUTH  EVERY 12 HOURS 180 capsule 3  . senna-docusate (SENOKOT-S) 8.6-50 MG tablet Take 1 tablet by mouth 2 (two) times daily. 60 tablet 0  . tamsulosin (FLOMAX) 0.4 MG CAPS capsule TAKE 1 CAPSULE BY MOUTH EVERY DAY (Patient taking differently: Take 0.4 mg by mouth daily as needed (repeat kidney stone). ) 30 capsule 5  . traMADol (ULTRAM) 50 MG tablet Take 1 tablet (50 mg total) by mouth 4 (four) times daily as needed for severe pain. 28 tablet 0  . traMADol (ULTRAM) 50 MG tablet Take 1 tablet (50 mg total) by mouth  every 6 (six) hours as needed. 120 tablet 1  . traZODone (DESYREL) 50 MG tablet Take 1 tablet (50 mg total) by mouth at bedtime. 30 tablet 5  . triamcinolone cream (KENALOG) 0.1 % Apply 1 application topically 2 (two) times daily. As needed. (Patient taking differently: Apply 1 application topically 2 (two) times daily as needed (rash). ) 30 g 0   No current facility-administered medications for this visit.        Physical Exam: BP 129/64 (BP Location: Left Arm, Patient Position: Sitting, Cuff Size: Normal)   Pulse (!) 56   Temp (!) 97.3 F (36.3 C)   Resp 16   Ht 6\' 1"  (1.854 m)   Wt 235 lb (106.6 kg)   SpO2 97% Comment: ON RA  BMI 31.00 kg/m   General appearance: alert, cooperative, appears stated age and no distress Neurologic: intact Heart: regular rate and rhythm, S1, S2 normal, no murmur, click, rub or gallop Lungs: clear to auscultation bilaterally Abdomen: soft, non-tender; bowel sounds normal; no masses,  no organomegaly Extremities: extremities normal, atraumatic, no cyanosis or edema and Homans sign is negative, no sign of DVT Wound: Wound left supraclavicular area approximately 2 cm x 3 cm granulating well but with some superior undermining   Diagnostic Studies & Laboratory data:     Recent Radiology Findings:   Dg Chest 1 View  Result Date: 10/14/2019 CLINICAL DATA:  Septic arthritis of sternoclavicular joint. Wound VAC present. EXAM: CHEST  1 VIEW COMPARISON:  None. FINDINGS: PICC line noted with tip over superior vena cava. Drainage catheter noted over the left upper chest. Cardiomegaly. No pulmonary venous congestion. Bibasilar atelectasis. No pleural effusion or pneumothorax. IMPRESSION: 1. PICC line noted with tip over superior vena cava. Drainage catheter noted over the left upper chest. 2.  Cardiomegaly.  No pulmonary venous congestion. 3.  Bibasilar atelectasis. Electronically Signed   By: Marcello Moores  Register   On: 10/14/2019 13:43  Recent Lab Findings:  Lab Results  Component Value Date   WBC 8.8 09/30/2019   HGB 13.1 09/30/2019   HCT 38.3 (L) 09/30/2019   PLT 159 09/30/2019   GLUCOSE 142 (H) 09/30/2019   CHOL 85 02/08/2019   TRIG 110.0 02/08/2019   HDL 30.00 (L) 02/08/2019   LDLCALC 33 02/08/2019   ALT 25 09/30/2019   AST 38 09/30/2019   NA 137 09/30/2019   K 4.2 09/30/2019   CL 104 09/30/2019   CREATININE 0.86 09/30/2019   BUN 28 (H) 09/30/2019   CO2 25 09/30/2019   TSH 1.414 08/27/2019   INR 1.2 08/31/2019   HGBA1C 6.7 (H) 07/21/2019      Assessment / Plan:   Overall considering the patient's preoperative condition he is making good progress he still is using wheelchair for long distance traveling, we removed the PICC line in the office today Repacked the wound with the wound VAC Plan to see him back in 2 weeks   Medication Changes: No orders of the defined types were placed in this encounter.     Grace Isaac MD      [address] [place] [address] Office [phone]   [place]  10/14/2019 1:52 PM

## 2019-10-18 ENCOUNTER — Encounter: Payer: Self-pay | Admitting: Infectious Diseases

## 2019-10-18 DIAGNOSIS — Z452 Encounter for adjustment and management of vascular access device: Secondary | ICD-10-CM | POA: Insufficient documentation

## 2019-10-18 NOTE — Assessment & Plan Note (Signed)
He has follow up with Dr. Servando Snare soon. Wound vac is in place and clean/dry without erythema. The photo he showed me looks almost like a calcium stone; I asked him to please show the photo to Dr. Servando Snare at his follow up to ensure all is well from surgical recovery.

## 2019-10-18 NOTE — Assessment & Plan Note (Signed)
Will call his home health team and have them D/C line after today's dose.

## 2019-10-18 NOTE — Assessment & Plan Note (Signed)
Resolved with adequate treatment.

## 2019-10-18 NOTE — Assessment & Plan Note (Signed)
Pain has improved significantly per his report. Still requiring TID pain relief although given his significant underlying pathology with multilevel foraminal stenosis I would suspect he is quite uncomfortable. Will get him in to see neurosurgeon to follow up on the above. His inflammatory markers have remained flat during treatment and were not elevated at the time of his admission.  Will continue with 2 more weeks of PO cephalexin 500 mg TID and then have him stop. He will follow up with me again in 6 weeks. Advised to call sooner if he experiences relapsing fevers otherwise unexplained or worsening back pain once we stop his antibiotics.

## 2019-10-18 NOTE — Assessment & Plan Note (Signed)
Secondary to acute discitis/osteomyelitis of L spine and septic arthritis L [place] joint. Endocarditis assessment during inpatient stay was negative. No signs of relapsing bacteremia today. Continue current treatment plan.

## 2019-10-19 LAB — ACID FAST CULTURE WITH REFLEXED SENSITIVITIES (MYCOBACTERIA): Acid Fast Culture: NEGATIVE

## 2019-10-22 NOTE — Progress Notes (Signed)
Virtual Visit via Video Note changed to phone visit due to technical difficulties.   This visit type was conducted due to national recommendations for restrictions regarding the COVID-19 Pandemic (e.g. social distancing) in an effort to limit this patient's exposure and mitigate transmission in our community.  Due to his co-morbid illnesses, this patient is at least at moderate risk for complications without adequate follow up.  This format is felt to be most appropriate for this patient at this time.  All issues noted in this document were discussed and addressed.  A limited physical exam was performed with this format.  Please refer to the patient's chart for his consent to telehealth for [facility].   Date:  10/25/2019   ID:  [name]., DOB November 14, 1951, MRN [id number]  Patient Location:Home Provider Location: Home  PCP:  Burnis Medin, MD  Cardiologist:  Dr Stanford Breed  Evaluation Performed:  Follow-Up Visit  Chief Complaint:  FU atrial fibrillation  History of Present Illness:    FU hx of CAD s/p stent to LAD in 2003, parox AFib, HL, OSA. He has seen Dr. Rayann Heman and ablation could be considered if he lost weight. Previous rash with cardizem; on tikosyn. Has had previous cardioversions for atrial fibrillation. Carotid Dopplers June 2019 showed 1 to 39% bilateral stenosis. 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 Echocardiogram August 2020 showed normal LV function, mild left atrial enlargement and trace aortic insufficiency. CTA August 2020 showed 50% left subclavian and 30 to 50% right vertebral artery. Ectatic ascending aorta measuring 4.2 cm. Patient admitted August 2020 with fever and altered mental status and blood cultures returned positive for MSSA. He was found to have discitis/osteomyelitis. Also had septic arthritis of the left sternoclavicular joint which required debridement. Since last seen,patient is slowly recovering from recent hospitalization.  He denies  recurrent fevers, chills, dyspnea, chest pain or syncope.  No bleeding.  The patient does not have symptoms concerning for COVID-19 infection (fever, chills, cough, or new shortness of breath).    Past Medical History:  Diagnosis Date  . Atrial fibrillation ([place]) 03/22/2009   a. s/p multiple DCCV; b. no coumadin due to low TE risk profile; c. Tikosyn Rx  . Coronary atherosclerosis of native coronary artery 11/2002   a. s/p stent to LAD 12/03; OM2 occluded at cath 12/03; d. myoview 5/10: no ischemia;  e. echo 7/11: EF 55%, BAE, mild RVE, PASP 41-45; Myoview was in March 2013. There was no ischemia or infarction, EF 51%   . Cutaneous abscess of back excluding buttocks 07/04/2014   Appears to stem from possibly a cyst very large area 6 cm contact surgeon office   . Diabetes mellitus without complication ([place])   . Drusen body    see opth note  . ERECTILE DYSFUNCTION 03/22/2009  . GERD 03/22/2009  . HYPERGLYCEMIA 04/25/2010  . HYPERLIPIDEMIA 03/22/2009  . 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 LATERAL EPICONDYLITIS, LEFT 10/24/2009  . LIVER FUNCTION TESTS, ABNORMAL 04/25/2010  . Local reaction to immunization 05/05/2012   minor resolving  zostavax   . Myocardial infarction ([place]) mi2003  . Numbness in left leg    foot related to back disease and surgery  . Obesity, unspecified 04/24/2009  . Perforated appendicitis with necrosis s/p open appendectomy 06/07/14 06/04/2014  . Renal cyst    Characterized by MRI as simple  . Ruptured suppurative appendicitis    2015   . SLEEP APNEA, OBSTRUCTIVE  03/22/2009   compliant with CPAP  . THROMBOCYTOPENIA 08/16/2010  . TOBACCO USE, QUIT 10/24/2009  . 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 Umbilical hernia    Past Surgical History:  Procedure Laterality Date  . APPENDECTOMY N/A 06/06/2014   Procedure: APPENDECTOMY;  Surgeon: Odis Hollingshead, MD;  Location: [facility];  Service: General;  Laterality: N/A;  . COLON SURGERY    .  COLONOSCOPY  11/15/2011   Procedure: COLONOSCOPY;  Surgeon: Juanita Craver, MD;  Location: [facility];  Service: Endoscopy;  Laterality: N/A;  . COLONOSCOPY WITH PROPOFOL N/A 01/03/2017   Procedure: COLONOSCOPY WITH PROPOFOL;  Surgeon: Carol Ada, MD;  Location: [facility];  Service: Endoscopy;  Laterality: N/A;  . cyst on epiglottis  08/2002  . ESOPHAGOGASTRODUODENOSCOPY  11/15/2011   Procedure: ESOPHAGOGASTRODUODENOSCOPY (EGD);  Surgeon: Juanita Craver, MD;  Location: [facility];  Service: Endoscopy;  Laterality: N/A;  . LAPAROSCOPIC APPENDECTOMY N/A 06/06/2014   Procedure: APPENDECTOMY LAPAROSCOPIC attemted;  Surgeon: Odis Hollingshead, MD;  Location: [facility];  Service: General;  Laterality: N/A;  . LUMBAR DISC SURGERY     two  holes in spinalcovering  . stent     LAD DUS 2004  . STERNAL WOUND DEBRIDEMENT Left 08/31/2019   Procedure: LEFT STERNOCLAVICULAR WOUND DEBRIDEMENT WITH APPLICATION OF WOUND VAC;  Surgeon: Grace Isaac, MD;  Location: [facility];  Service: Thoracic;  Laterality: Left;  . surgery l4-l5   1998   ruptured x 3  . ulnar neuropathy    . UMBILICAL HERNIA REPAIR     mesh     Current Meds  Medication Sig  . acetaminophen (TYLENOL) 325 MG tablet Take 650 mg by mouth every 6 (six) hours as needed for mild pain.  [name] albuterol (PROVENTIL HFA;VENTOLIN HFA) 108 (90 Base) MCG/ACT inhaler Inhale 2 puffs into the lungs every 6 (six) hours as needed for wheezing or shortness of breath.  . ALPHAGAN P 0.1 % SOLN Place 1 drop into both eyes 3 (three) times daily.   . Amino Acids-Protein Hydrolys (FEEDING SUPPLEMENT, PRO-STAT SUGAR FREE 64,) LIQD Take 30 mLs by mouth 2 (two) times daily.  [name] atorvastatin (LIPITOR) 80 MG tablet TAKE 1 TABLET BY MOUTH  DAILY (Patient taking differently: Take 80 mg by mouth at bedtime. )  . cephALEXin (KEFLEX) 500 MG capsule Take 1 capsule (500 mg total) by mouth 3 (three) times daily for 14 days.  . diphenhydrAMINE (BENADRYL) 25 MG tablet Take 25 mg by  mouth at bedtime as needed for sleep.  [name] dofetilide (TIKOSYN) 500 MCG capsule Take 1 capsule (500 mcg total) by mouth 2 (two) times daily.  . famotidine (PEPCID) 10 MG tablet Take 10 mg by mouth as needed for heartburn.   . fexofenadine (ALLEGRA) 180 MG tablet Take 180 mg by mouth daily.  . fish oil-omega-3 fatty acids 1000 MG capsule Take 1 g by mouth 2 (two) times daily.   . fluticasone (FLONASE) 50 MCG/ACT nasal spray Place 2 sprays into both nostrils daily as needed for allergies.  . fluticasone furoate-vilanterol (BREO ELLIPTA) 200-25 MCG/INH AEPB Inhale 1 puff into the lungs daily.   [name] lidocaine (LIDODERM) 5 % Place 2 patches onto the skin daily. Apply to lumbar spine at 7 am and remove at 7 pm daily  . magnesium oxide (MAG-OX) 400 (241.3 Mg) MG tablet Take 1 tablet (400 mg total) by mouth daily.  . Menthol-Methyl Salicylate (MUSCLE RUB) 10-15 % CREA Apply 1 application topically as needed for muscle pain.  [name]  metFORMIN (GLUCOPHAGE-XR) 500 MG 24 hr tablet Take 1 tablet (500 mg total) by mouth 3 (three) times daily.  . methocarbamol (ROBAXIN) 500 MG tablet Take 1 tablet (500 mg total) by mouth 4 (four) times daily as needed for muscle spasms.  . MULTIPLE VITAMIN PO Take 1 tablet by mouth every evening.   . nitroGLYCERIN (NITROSTAT) 0.4 MG SL tablet Place 1 tablet (0.4 mg total) under the tongue every 5 (five) minutes as needed for chest pain.  [name] ondansetron (ZOFRAN ODT) 4 MG disintegrating tablet Take 1 tablet (4 mg total) by mouth every 8 (eight) hours as needed for nausea or vomiting.  [name] VERIO test strip USE TWICE A DAY  . pantoprazole (PROTONIX) 40 MG tablet Take 40 mg by mouth daily.  [name] PARoxetine (PAXIL) 20 MG tablet Take 1 tablet (20 mg total) by mouth at bedtime.  . potassium chloride SA (KLOR-CON M20) 20 MEQ tablet Take 1 tablet (20 mEq total) by mouth 2 (two) times daily.  [name] PRADAXA 150 MG CAPS capsule TAKE 1 CAPSULE BY MOUTH  EVERY 12 HOURS  . senna-docusate (SENOKOT-S)  8.6-50 MG tablet Take 1 tablet by mouth 2 (two) times daily.  . tamsulosin (FLOMAX) 0.4 MG CAPS capsule TAKE 1 CAPSULE BY MOUTH EVERY DAY (Patient taking differently: Take 0.4 mg by mouth daily as needed (repeat kidney stone). )  . traMADol (ULTRAM) 50 MG tablet Take 1 tablet (50 mg total) by mouth 4 (four) times daily as needed for severe pain.  . traMADol (ULTRAM) 50 MG tablet Take 1 tablet (50 mg total) by mouth every 6 (six) hours as needed.  . traZODone (DESYREL) 50 MG tablet Take 1 tablet (50 mg total) by mouth at bedtime.  . triamcinolone cream (KENALOG) 0.1 % Apply 1 application topically 2 (two) times daily. As needed. (Patient taking differently: Apply 1 application topically 2 (two) times daily as needed (rash). )  . [DISCONTINUED] ALPRAZolam (XANAX) 0.5 MG tablet Take 1 tablet (0.5 mg total) by mouth at bedtime as needed for anxiety.     Allergies:   Pseudoephedrine, Diltiazem, Novocain [procaine], Quinolones, Pseudoephedrine hcl, and Sulfonamide derivatives   Social History   Tobacco Use  . Smoking status: Former Smoker    Packs/day: 2.00    Years: 42.00    Pack years: 84.00    Types: Cigarettes    Quit date: 12/30/2008    Years since quitting: 10.8  . Smokeless tobacco: Never Used  . Tobacco comment: started at age 42; 1-2 ppd; quit 2010  Substance Use Topics  . Alcohol use: No    Alcohol/week: 0.0 standard drinks    Comment: rarely; maybe 1 beer a year  . Drug use: No     Family Hx: The patient's family history includes Breast cancer in his mother; Cancer in his father; Lung cancer in his father; Ovarian cancer in his mother; Thyroid disease in his mother.  ROS:   Please see the history of present illness.    No Fever, chills  or productive cough; he does have residual back pain and weakness from recent hospitalization. All other systems reviewed and are negative.   Recent Labs: 08/27/2019: TSH 1.414 09/20/2019: Magnesium 1.9 09/30/2019: ALT 25; BUN 28; Creatinine,  Ser 0.86; Hemoglobin 13.1; Platelets 159; Potassium 4.2; Sodium 137   Recent Lipid Panel Lab Results  Component Value Date/Time   CHOL 85 02/08/2019 08:06 AM   CHOL 95 (L) 12/25/2017 09:37 AM   TRIG 110.0 02/08/2019 08:06 AM  HDL 30.00 (L) 02/08/2019 08:06 AM   HDL 44 12/25/2017 09:37 AM   CHOLHDL 3 02/08/2019 08:06 AM   LDLCALC 33 02/08/2019 08:06 AM   LDLCALC 30 12/25/2017 09:37 AM    Wt Readings from Last 3 Encounters:  10/25/19 235 lb (106.6 kg)  10/14/19 235 lb (106.6 kg)  10/13/19 235 lb (106.6 kg)     Objective:    Vital Signs:  BP 128/74   Pulse 74   Ht 6\' 1"  (1.854 m)   Wt 235 lb (106.6 kg)   BMI 31.00 kg/m    VITAL SIGNS:  reviewed NAD Answers questions appropriately Normal affect Remainder of physical examination not performed (telehealth visit; coronavirus pandemic)  ASSESSMENT & PLAN:    1. Paroxysmal atrial fibrillation-based on history he remains in sinus rhythm.  Continue present dose of Tikosyn and Pradaxa. 2. Coronary artery disease-patient is not on aspirin given need for chronic anticoagulation.  Continue statin. 3. Hyperlipidemia-continue statin. 4. Dilated thoracic aortic root-follow-up chest CT August 2021. 5. Iliac aneurysm-followed by vascular surgery. 6. Morbid obesity-patient has lost 40 to 50 pounds since his recent illness. 7. Recent bacteremia-patient has improved with antibiotics.  If he develops any recurrent fevers after completion I have asked him to contact us as he would likely require TEE to rule out vegetation.  COVID-19 Education: The importance of social distancing was discussed today.  Time:   Today, I have spent 18 minutes with the patient with telehealth technology discussing the above problems.     Medication Adjustments/Labs and Tests Ordered: Current medicines are reviewed at length with the patient today.  Concerns regarding medicines are outlined above.   Tests Ordered: No orders of the defined types were placed  in this encounter.   Medication Changes: No orders of the defined types were placed in this encounter.   Follow Up:  Either In Person or Virtual in 6 month(s)  Signed, [name], MD  10/25/2019 7:58 AM    [place]

## 2019-10-25 ENCOUNTER — Encounter: Payer: Self-pay | Admitting: Cardiology

## 2019-10-25 ENCOUNTER — Telehealth (INDEPENDENT_AMBULATORY_CARE_PROVIDER_SITE_OTHER): Payer: Medicare Other | Admitting: Cardiology

## 2019-10-25 VITALS — BP 128/74 | HR 74 | Ht 73.0 in | Wt 235.0 lb

## 2019-10-25 DIAGNOSIS — I480 Paroxysmal atrial fibrillation: Secondary | ICD-10-CM | POA: Diagnosis not present

## 2019-10-25 DIAGNOSIS — E7800 Pure hypercholesterolemia, unspecified: Secondary | ICD-10-CM

## 2019-10-25 DIAGNOSIS — I2510 Atherosclerotic heart disease of native coronary artery without angina pectoris: Secondary | ICD-10-CM | POA: Diagnosis not present

## 2019-10-25 NOTE — Patient Instructions (Signed)
Medication Instructions:  NO CHANGE *If you need a refill on your cardiac medications before your next appointment, please call your pharmacy*  Lab Work: If you have labs (blood work) drawn today and your tests are completely normal, you will receive your results only by: . MyChart Message (if you have MyChart) OR . A paper copy in the mail If you have any lab test that is abnormal or we need to change your treatment, we will call you to review the results.  Follow-Up: At [facility], you and your health needs are our priority.  As part of our continuing mission to provide you with exceptional heart care, we have created designated Provider Care Teams.  These Care Teams include your primary Cardiologist (physician) and Advanced Practice Providers (APPs -  Physician Assistants and Nurse Practitioners) who all work together to provide you with the care you need, when you need it.  Your next appointment:   6 month(s)  The format for your next appointment:   In Person  Provider:   Brian Crenshaw, MD   

## 2019-10-26 ENCOUNTER — Other Ambulatory Visit: Payer: Self-pay | Admitting: Physical Medicine and Rehabilitation

## 2019-10-26 NOTE — Progress Notes (Deleted)
No chief complaint on file.   HPI: [name] [name]. 68 y.o. come in for continued fu from prolonged hospitalization  fro sep[tci arthrits   And dsicitisi  Under rehab care with trmamadol and robaxin and plan to wean  paxil in futuer  And trazodone for sleep.  ROS: See pertinent positives and negatives per HPI.  Past Medical History:  Diagnosis Date  . Atrial fibrillation ([place]) 03/22/2009   a. s/p multiple DCCV; b. no coumadin due to low TE risk profile; c. Tikosyn Rx  . Coronary atherosclerosis of native coronary artery 11/2002   a. s/p stent to LAD 12/03; OM2 occluded at cath 12/03; d. myoview 5/10: no ischemia;  e. echo 7/11: EF 55%, BAE, mild RVE, PASP 41-45; Myoview was in March 2013. There was no ischemia or infarction, EF 51%   . Cutaneous abscess of back excluding buttocks 07/04/2014   Appears to stem from possibly a cyst very large area 6 cm contact surgeon office   . Diabetes mellitus without complication ([place])   . Drusen body    see opth note  . ERECTILE DYSFUNCTION 03/22/2009  . GERD 03/22/2009  . HYPERGLYCEMIA 04/25/2010  . HYPERLIPIDEMIA 03/22/2009  . 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 LATERAL EPICONDYLITIS, LEFT 10/24/2009  . LIVER FUNCTION TESTS, ABNORMAL 04/25/2010  . Local reaction to immunization 05/05/2012   minor resolving  zostavax   . Myocardial infarction ([place]) mi2003  . Numbness in left leg    foot related to back disease and surgery  . Obesity, unspecified 04/24/2009  . Perforated appendicitis with necrosis s/p open appendectomy 06/07/14 06/04/2014  . Renal cyst    Characterized by MRI as simple  . Ruptured suppurative appendicitis    2015   . SLEEP APNEA, OBSTRUCTIVE 03/22/2009   compliant with CPAP  . THROMBOCYTOPENIA 08/16/2010  . TOBACCO USE, QUIT 10/24/2009  . 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 Umbilical hernia     Family History  Problem Relation Age of Onset  . Thyroid disease Mother   . Ovarian cancer Mother    . Breast cancer Mother   . Lung cancer Father   . Cancer Father     Social History   Socioeconomic History  . Marital status: Married    Spouse name: Not on file  . Number of children: Not on file  . Years of education: Not on file  . Highest education level: Not on file  Occupational History  . Not on file  Social Needs  . Financial resource strain: Not on file  . Food insecurity    Worry: Not on file    Inability: Not on file  . Transportation needs    Medical: Not on file    Non-medical: Not on file  Tobacco Use  . Smoking status: Former Smoker    Packs/day: 2.00    Years: 42.00    Pack years: 84.00    Types: Cigarettes    Quit date: 12/30/2008    Years since quitting: 10.8  . Smokeless tobacco: Never Used  . Tobacco comment: started at age 89; 1-2 ppd; quit 2010  Substance and Sexual Activity  . Alcohol use: No    Alcohol/week: 0.0 standard drinks    Comment: rarely; maybe 1 beer a year  . Drug use: No  . Sexual activity: Yes  Lifestyle  . Physical activity    Days per week: Not on file    Minutes  per session: Not on file  . Stress: Not on file  Relationships  . Social Herbalist on phone: Not on file    Gets together: Not on file    Attends religious service: Not on file    Active member of club or organization: Not on file    Attends meetings of clubs or organizations: Not on file    Relationship status: Not on file  Other Topics Concern  . Not on file  Social History Narrative   Retired paramedic   Regular exercise-yes not recently    Has children   Wife is overweight and has fibromyalgia and depression on disability doesn't go out much. Back surgery    Has older dog   Retired from [employer] 31 years of service now 7 years    Caffeine- minimal    Outpatient Medications Prior to Visit  Medication Sig Dispense Refill  . acetaminophen (TYLENOL) 325 MG tablet Take 650 mg by mouth every 6 (six) hours as needed for mild pain.    [name] albuterol  (PROVENTIL HFA;VENTOLIN HFA) 108 (90 Base) MCG/ACT inhaler Inhale 2 puffs into the lungs every 6 (six) hours as needed for wheezing or shortness of breath. 1 Inhaler 0  . ALPHAGAN P 0.1 % SOLN Place 1 drop into both eyes 3 (three) times daily.   3  . Amino Acids-Protein Hydrolys (FEEDING SUPPLEMENT, PRO-STAT SUGAR FREE 64,) LIQD Take 30 mLs by mouth 2 (two) times daily. 887 mL 0  . atorvastatin (LIPITOR) 80 MG tablet TAKE 1 TABLET BY MOUTH  DAILY (Patient taking differently: Take 80 mg by mouth at bedtime. ) 90 tablet 1  . cephALEXin (KEFLEX) 500 MG capsule Take 1 capsule (500 mg total) by mouth 3 (three) times daily for 14 days. 42 capsule 0  . diphenhydrAMINE (BENADRYL) 25 MG tablet Take 25 mg by mouth at bedtime as needed for sleep.    [name] dofetilide (TIKOSYN) 500 MCG capsule Take 1 capsule (500 mcg total) by mouth 2 (two) times daily. 180 capsule 2  . famotidine (PEPCID) 10 MG tablet Take 10 mg by mouth as needed for heartburn.     . fexofenadine (ALLEGRA) 180 MG tablet Take 180 mg by mouth daily.    . fish oil-omega-3 fatty acids 1000 MG capsule Take 1 g by mouth 2 (two) times daily.     . fluticasone (FLONASE) 50 MCG/ACT nasal spray Place 2 sprays into both nostrils daily as needed for allergies. 48 g prn  . fluticasone furoate-vilanterol (BREO ELLIPTA) 200-25 MCG/INH AEPB Inhale 1 puff into the lungs daily.     [name] lidocaine (LIDODERM) 5 % Place 2 patches onto the skin daily. Apply to lumbar spine at 7 am and remove at 7 pm daily 60 patch 0  . magnesium oxide (MAG-OX) 400 (241.3 Mg) MG tablet Take 1 tablet (400 mg total) by mouth daily. 30 tablet 0  . Menthol-Methyl Salicylate (MUSCLE RUB) 10-15 % CREA Apply 1 application topically as needed for muscle pain.  0  . metFORMIN (GLUCOPHAGE-XR) 500 MG 24 hr tablet Take 1 tablet (500 mg total) by mouth 3 (three) times daily.    . methocarbamol (ROBAXIN) 500 MG tablet Take 1 tablet (500 mg total) by mouth 4 (four) times daily as needed for muscle spasms.  120 tablet 2  . MULTIPLE VITAMIN PO Take 1 tablet by mouth every evening.     . nitroGLYCERIN (NITROSTAT) 0.4 MG SL tablet Place 1 tablet (0.4 mg total) under  the tongue every 5 (five) minutes as needed for chest pain. 25 tablet 12  . ondansetron (ZOFRAN ODT) 4 MG disintegrating tablet Take 1 tablet (4 mg total) by mouth every 8 (eight) hours as needed for nausea or vomiting. 24 tablet 0  . ONETOUCH VERIO test strip USE TWICE A DAY 50 each 12  . pantoprazole (PROTONIX) 40 MG tablet Take 40 mg by mouth daily.    [name] PARoxetine (PAXIL) 20 MG tablet TAKE 1 TABLET BY MOUTH EVERYDAY AT BEDTIME 30 tablet 0  . potassium chloride SA (KLOR-CON M20) 20 MEQ tablet Take 1 tablet (20 mEq total) by mouth 2 (two) times daily. 60 tablet 0  . PRADAXA 150 MG CAPS capsule TAKE 1 CAPSULE BY MOUTH  EVERY 12 HOURS 180 capsule 3  . senna-docusate (SENOKOT-S) 8.6-50 MG tablet Take 1 tablet by mouth 2 (two) times daily. 60 tablet 0  . tamsulosin (FLOMAX) 0.4 MG CAPS capsule TAKE 1 CAPSULE BY MOUTH EVERY DAY (Patient taking differently: Take 0.4 mg by mouth daily as needed (repeat kidney stone). ) 30 capsule 5  . traMADol (ULTRAM) 50 MG tablet Take 1 tablet (50 mg total) by mouth 4 (four) times daily as needed for severe pain. 28 tablet 0  . traMADol (ULTRAM) 50 MG tablet Take 1 tablet (50 mg total) by mouth every 6 (six) hours as needed. 120 tablet 1  . traZODone (DESYREL) 50 MG tablet Take 1 tablet (50 mg total) by mouth at bedtime. 30 tablet 5  . triamcinolone cream (KENALOG) 0.1 % Apply 1 application topically 2 (two) times daily. As needed. (Patient taking differently: Apply 1 application topically 2 (two) times daily as needed (rash). ) 30 g 0   No facility-administered medications prior to visit.      EXAM:  There were no vitals taken for this visit.  There is no height or weight on file to calculate BMI. Wt Readings from Last 3 Encounters:  10/25/19 235 lb (106.6 kg)  10/14/19 235 lb (106.6 kg)  10/13/19 235  lb (106.6 kg)    GENERAL: vitals reviewed and listed above, alert, oriented, appears well hydrated and in no acute distress HEENT: atraumatic, conjunctiva  clear, no obvious abnormalities on inspection of external nose and ears OP : no lesion edema or exudate  NECK: no obvious masses on inspection palpation  LUNGS: clear to auscultation bilaterally, no wheezes, rales or rhonchi, good air movement CV: HRRR, no clubbing cyanosis or  peripheral edema nl cap refill  MS: moves all extremities without noticeable focal  abnormality PSYCH: pleasant and cooperative, no obvious depression or anxiety Lab Results  Component Value Date   WBC 8.8 09/30/2019   HGB 13.1 09/30/2019   HCT 38.3 (L) 09/30/2019   PLT 159 09/30/2019   GLUCOSE 142 (H) 09/30/2019   CHOL 85 02/08/2019   TRIG 110.0 02/08/2019   HDL 30.00 (L) 02/08/2019   LDLCALC 33 02/08/2019   ALT 25 09/30/2019   AST 38 09/30/2019   NA 137 09/30/2019   K 4.2 09/30/2019   CL 104 09/30/2019   CREATININE 0.86 09/30/2019   BUN 28 (H) 09/30/2019   CO2 25 09/30/2019   TSH 1.414 08/27/2019   PSA 1.12 07/21/2019   INR 1.2 08/31/2019   HGBA1C 6.7 (H) 07/21/2019   MICROALBUR 18.7 (H) 02/08/2019   BP Readings from Last 3 Encounters:  10/25/19 128/74  10/14/19 129/64  10/13/19 117/70    ASSESSMENT AND PLAN:  Discussed the following assessment and plan:  No  diagnosis found. ? a1c    Long haul and deconditioning from prolonged illness and hospitalization  -Patient advised to return or notify health care team  if  new concerns arise.  There are no Patient Instructions on file for this visit.   Standley Brooking.  M.D.

## 2019-10-27 ENCOUNTER — Inpatient Hospital Stay: Payer: Medicare Other | Admitting: Internal Medicine

## 2019-10-28 ENCOUNTER — Telehealth (INDEPENDENT_AMBULATORY_CARE_PROVIDER_SITE_OTHER): Payer: Medicare Other | Admitting: Internal Medicine

## 2019-10-28 ENCOUNTER — Ambulatory Visit: Payer: Self-pay | Admitting: Cardiothoracic Surgery

## 2019-10-28 ENCOUNTER — Encounter: Payer: Self-pay | Admitting: Internal Medicine

## 2019-10-28 ENCOUNTER — Other Ambulatory Visit: Payer: Self-pay

## 2019-10-28 VITALS — BP 118/70 | HR 81 | Temp 97.7°F | Resp 16 | Wt 243.6 lb

## 2019-10-28 DIAGNOSIS — E1159 Type 2 diabetes mellitus with other circulatory complications: Secondary | ICD-10-CM | POA: Diagnosis not present

## 2019-10-28 DIAGNOSIS — M4646 Discitis, unspecified, lumbar region: Secondary | ICD-10-CM | POA: Diagnosis not present

## 2019-10-28 DIAGNOSIS — Z7901 Long term (current) use of anticoagulants: Secondary | ICD-10-CM

## 2019-10-28 DIAGNOSIS — Z79899 Other long term (current) drug therapy: Secondary | ICD-10-CM

## 2019-10-28 DIAGNOSIS — M009 Pyogenic arthritis, unspecified: Secondary | ICD-10-CM

## 2019-10-28 DIAGNOSIS — M4800 Spinal stenosis, site unspecified: Secondary | ICD-10-CM

## 2019-10-28 NOTE — Patient Instructions (Signed)
Stop wound VAC to the left upper chest continue wet-to-dry saline dressings as instructed daily

## 2019-10-28 NOTE — Progress Notes (Signed)
Virtual Visit via Video Note  I connected with@ on 10/28/19 at 11:00 AM EDT by a video enabled telemedicine application and verified that I am speaking with the correct person using two identifiers. Location patient: home Location provider   home office Persons participating in the virtual visit: patient, provider  [place] national recommendations  regarding COVID 19 pandemic   video visit is advised over in office visit for this patient.  Patient aware  of the limitations of evaluation and management by telemedicine and  availability of in person appointments. and agreed to proceed.   HPI: [name] [name]. 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  His pain is gone way down and he is not even using tramadol at this point just the muscle relaxer The plan is to keep him on Paxil and wean eventually after few months and he continues to use trazodone at night for sleep and is off the [place]. No excessive bleeding he states that his weight is 245 blood pressure 115/60 and his blood sugar was 109. He is continuing physical therapy but may be discharged soon is meeting goals. He is now walking independently and is now cleared to drive. He has had a full eye exam and his vision is actually improved.      televist with his cardiology team  This week  1. Paroxysmal atrial fibrillation-based on history he remains in sinus rhythm.  Continue present dose of Tikosyn and Pradaxa. 2. Coronary artery disease-patient is not on aspirin given need for chronic anticoagulation.  Continue statin. 3. Hyperlipidemia-continue statin. 4. Dilated thoracic aortic root-follow-up chest CT August 2021. 5. Iliac aneurysm-followed by vascular surgery. 6. Morbid obesity-patient has lost 40 to 50 pounds since his recent illness. 7. Recent bacteremia-patient has improved with  antibiotics.  If he develops any recurrent fevers after completion I have asked him to contact us as he would likely require TEE to rule out vegetation.   ROS: See pertinent positives and negatives per HPI.  No current chest pain shortness of breath fever  Past Medical History:  Diagnosis Date  . Atrial fibrillation ([place]) 03/22/2009   a. s/p multiple DCCV; b. no coumadin due to low TE risk profile; c. Tikosyn Rx  . Coronary atherosclerosis of native coronary artery 11/2002   a. s/p stent to LAD 12/03; OM2 occluded at cath 12/03; d. myoview 5/10: no ischemia;  e. echo 7/11: EF 55%, BAE, mild RVE, PASP 41-45; Myoview was in March 2013. There was no ischemia or infarction, EF 51%   . Cutaneous abscess of back excluding buttocks 07/04/2014   Appears to stem from possibly a cyst very large area 6 cm contact surgeon office   . Diabetes mellitus without complication ([place])   . Drusen body    see opth note  . ERECTILE DYSFUNCTION 03/22/2009  . GERD 03/22/2009  . HYPERGLYCEMIA 04/25/2010  . HYPERLIPIDEMIA 03/22/2009  . 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 LATERAL EPICONDYLITIS, LEFT 10/24/2009  . LIVER FUNCTION TESTS, ABNORMAL 04/25/2010  . Local reaction to immunization 05/05/2012   minor resolving  zostavax   . Myocardial infarction ([place]) mi2003  . Numbness in left leg    foot related to back disease and surgery  . Obesity, unspecified 04/24/2009  . Perforated appendicitis with necrosis s/p open appendectomy 06/07/14  06/04/2014  . Renal cyst    Characterized by MRI as simple  . Ruptured suppurative appendicitis    2015   . SLEEP APNEA, OBSTRUCTIVE 03/22/2009   compliant with CPAP  . THROMBOCYTOPENIA 08/16/2010  . TOBACCO USE, QUIT 10/24/2009  . 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 Umbilical hernia     Past Surgical History:  Procedure Laterality Date  . APPENDECTOMY N/A 06/06/2014   Procedure: APPENDECTOMY;  Surgeon: Odis Hollingshead, MD;  Location: [facility];   Service: General;  Laterality: N/A;  . COLON SURGERY    . COLONOSCOPY  11/15/2011   Procedure: COLONOSCOPY;  Surgeon: Juanita Craver, MD;  Location: [facility];  Service: Endoscopy;  Laterality: N/A;  . COLONOSCOPY WITH PROPOFOL N/A 01/03/2017   Procedure: COLONOSCOPY WITH PROPOFOL;  Surgeon: Carol Ada, MD;  Location: [facility];  Service: Endoscopy;  Laterality: N/A;  . cyst on epiglottis  08/2002  . ESOPHAGOGASTRODUODENOSCOPY  11/15/2011   Procedure: ESOPHAGOGASTRODUODENOSCOPY (EGD);  Surgeon: Juanita Craver, MD;  Location: [facility];  Service: Endoscopy;  Laterality: N/A;  . LAPAROSCOPIC APPENDECTOMY N/A 06/06/2014   Procedure: APPENDECTOMY LAPAROSCOPIC attemted;  Surgeon: Odis Hollingshead, MD;  Location: [facility];  Service: General;  Laterality: N/A;  . LUMBAR DISC SURGERY     two  holes in spinalcovering  . stent     LAD DUS 2004  . STERNAL WOUND DEBRIDEMENT Left 08/31/2019   Procedure: LEFT STERNOCLAVICULAR WOUND DEBRIDEMENT WITH APPLICATION OF WOUND VAC;  Surgeon: Grace Isaac, MD;  Location: [facility];  Service: Thoracic;  Laterality: Left;  . surgery l4-l5   1998   ruptured x 3  . ulnar neuropathy    . UMBILICAL HERNIA REPAIR     mesh    Family History  Problem Relation Age of Onset  . Thyroid disease Mother   . Ovarian cancer Mother   . Breast cancer Mother   . Lung cancer Father   . Cancer Father     Social History   Tobacco Use  . Smoking status: Former Smoker    Packs/day: 2.00    Years: 42.00    Pack years: 84.00    Types: Cigarettes    Quit date: 12/30/2008    Years since quitting: 10.8  . Smokeless tobacco: Never Used  . Tobacco comment: started at age 75; 1-2 ppd; quit 2010  Substance Use Topics  . Alcohol use: No    Alcohol/week: 0.0 standard drinks    Comment: rarely; maybe 1 beer a year  . Drug use: No      Current Outpatient Medications:  .  acetaminophen (TYLENOL) 325 MG tablet, Take 650 mg by mouth every 6 (six) hours as needed for mild  pain., Disp: , Rfl:  .  albuterol (PROVENTIL HFA;VENTOLIN HFA) 108 (90 Base) MCG/ACT inhaler, Inhale 2 puffs into the lungs every 6 (six) hours as needed for wheezing or shortness of breath., Disp: 1 Inhaler, Rfl: 0 .  ALPHAGAN P 0.1 % SOLN, Place 1 drop into both eyes 3 (three) times daily. , Disp: , Rfl: 3 .  Amino Acids-Protein Hydrolys (FEEDING SUPPLEMENT, PRO-STAT SUGAR FREE 64,) LIQD, Take 30 mLs by mouth 2 (two) times daily., Disp: 887 mL, Rfl: 0 .  atorvastatin (LIPITOR) 80 MG tablet, TAKE 1 TABLET BY MOUTH  DAILY (Patient taking differently: Take 80 mg by mouth at bedtime. ), Disp: 90 tablet, Rfl: 1 .  diphenhydrAMINE (BENADRYL) 25 MG tablet, Take 25 mg by mouth at bedtime as needed for sleep., Disp: ,  Rfl:  .  dofetilide (TIKOSYN) 500 MCG capsule, Take 1 capsule (500 mcg total) by mouth 2 (two) times daily., Disp: 180 capsule, Rfl: 2 .  famotidine (PEPCID) 10 MG tablet, Take 10 mg by mouth as needed for heartburn. , Disp: , Rfl:  .  fexofenadine (ALLEGRA) 180 MG tablet, Take 180 mg by mouth daily., Disp: , Rfl:  .  fish oil-omega-3 fatty acids 1000 MG capsule, Take 1 g by mouth 2 (two) times daily. , Disp: , Rfl:  .  fluticasone (FLONASE) 50 MCG/ACT nasal spray, Place 2 sprays into both nostrils daily as needed for allergies., Disp: 48 g, Rfl: prn .  fluticasone furoate-vilanterol (BREO ELLIPTA) 200-25 MCG/INH AEPB, Inhale 1 puff into the lungs daily. , Disp: , Rfl:  .  lidocaine (LIDODERM) 5 %, Place 2 patches onto the skin daily. Apply to lumbar spine at 7 am and remove at 7 pm daily, Disp: 60 patch, Rfl: 0 .  magnesium oxide (MAG-OX) 400 (241.3 Mg) MG tablet, Take 1 tablet (400 mg total) by mouth daily., Disp: 30 tablet, Rfl: 0 .  Menthol-Methyl Salicylate (MUSCLE RUB) 10-15 % CREA, Apply 1 application topically as needed for muscle pain., Disp: , Rfl: 0 .  metFORMIN (GLUCOPHAGE-XR) 500 MG 24 hr tablet, Take 1 tablet (500 mg total) by mouth 3 (three) times daily., Disp: , Rfl:  .   methocarbamol (ROBAXIN) 500 MG tablet, Take 1 tablet (500 mg total) by mouth 4 (four) times daily as needed for muscle spasms., Disp: 120 tablet, Rfl: 2 .  MULTIPLE VITAMIN PO, Take 1 tablet by mouth every evening. , Disp: , Rfl:  .  nitroGLYCERIN (NITROSTAT) 0.4 MG SL tablet, Place 1 tablet (0.4 mg total) under the tongue every 5 (five) minutes as needed for chest pain., Disp: 25 tablet, Rfl: 12 .  ondansetron (ZOFRAN ODT) 4 MG disintegrating tablet, Take 1 tablet (4 mg total) by mouth every 8 (eight) hours as needed for nausea or vomiting., Disp: 24 tablet, Rfl: 0 .  ONETOUCH VERIO test strip, USE TWICE A DAY, Disp: 50 each, Rfl: 12 .  pantoprazole (PROTONIX) 40 MG tablet, Take 40 mg by mouth daily., Disp: , Rfl:  .  PARoxetine (PAXIL) 20 MG tablet, TAKE 1 TABLET BY MOUTH EVERYDAY AT BEDTIME, Disp: 30 tablet, Rfl: 0 .  potassium chloride SA (KLOR-CON M20) 20 MEQ tablet, Take 1 tablet (20 mEq total) by mouth 2 (two) times daily., Disp: 60 tablet, Rfl: 0 .  PRADAXA 150 MG CAPS capsule, TAKE 1 CAPSULE BY MOUTH  EVERY 12 HOURS, Disp: 180 capsule, Rfl: 3 .  senna-docusate (SENOKOT-S) 8.6-50 MG tablet, Take 1 tablet by mouth 2 (two) times daily., Disp: 60 tablet, Rfl: 0 .  tamsulosin (FLOMAX) 0.4 MG CAPS capsule, TAKE 1 CAPSULE BY MOUTH EVERY DAY (Patient taking differently: Take 0.4 mg by mouth daily as needed (repeat kidney stone). ), Disp: 30 capsule, Rfl: 5 .  traMADol (ULTRAM) 50 MG tablet, Take 1 tablet (50 mg total) by mouth 4 (four) times daily as needed for severe pain., Disp: 28 tablet, Rfl: 0 .  traMADol (ULTRAM) 50 MG tablet, Take 1 tablet (50 mg total) by mouth every 6 (six) hours as needed., Disp: 120 tablet, Rfl: 1 .  traZODone (DESYREL) 50 MG tablet, Take 1 tablet (50 mg total) by mouth at bedtime., Disp: 30 tablet, Rfl: 5 .  triamcinolone cream (KENALOG) 0.1 %, Apply 1 application topically 2 (two) times daily. As needed. (Patient taking differently: Apply 1 application  topically 2 (two)  times daily as needed (rash). ), Disp: 30 g, Rfl: 0  EXAM: BP Readings from Last 3 Encounters:  10/28/19 118/70  10/25/19 128/74  10/14/19 129/64    VITALS per patient if applicable: Reported weight 245 and blood pressure 115/60  GENERAL: alert, oriented, appears well and in no acute distress  HEENT: atraumatic, conjunttiva clear, no obvious abnormalities on inspection of external nose and ears  NECK: normal movements of the head and neck  LUNGS: on inspection no signs of respiratory distress, breathing rate appears normal, no obvious gross SOB, gasping or wheezing  CV: no obvious cyanosis    PSYCH/NEURO: pleasant and cooperative, no obvious depression or anxiety, speech and thought processing grossly intact Lab Results  Component Value Date   WBC 8.8 09/30/2019   HGB 13.1 09/30/2019   HCT 38.3 (L) 09/30/2019   PLT 159 09/30/2019   GLUCOSE 142 (H) 09/30/2019   CHOL 85 02/08/2019   TRIG 110.0 02/08/2019   HDL 30.00 (L) 02/08/2019   LDLCALC 33 02/08/2019   ALT 25 09/30/2019   AST 38 09/30/2019   NA 137 09/30/2019   K 4.2 09/30/2019   CL 104 09/30/2019   CREATININE 0.86 09/30/2019   BUN 28 (H) 09/30/2019   CO2 25 09/30/2019   TSH 1.414 08/27/2019   PSA 1.12 07/21/2019   INR 1.2 08/31/2019   HGBA1C 6.7 (H) 07/21/2019   MICROALBUR 18.7 (H) 02/08/2019    ASSESSMENT AND PLAN:  Discussed the following assessment and plan:    ICD-10-CM   1. Type 2 diabetes mellitus with other circulatory complications (HCC)  123456    Appears controlled.  2. Medication management  Z79.899   3. Discitis of lumbar region  M46.46   4. Chronic anticoagulation  Z79.01   5. Spinal stenosis, unspecified spinal region  M48.00    Much improved and rehabbing his back pain is significantly improved that he is now walking and beginning to drive. Reviewed medications contact us if he needs refills for those we are doing he is under care from cardiology vascular surgery and rehab.  And  infectious disease.  His eye exam is better and up-to-date as well as his flu vaccine. Other discussions he knows if he gets any relapsing symptoms to get his medical team right away. Counseled.  Since his blood sugars are good and he is doing better we will see him in January as had previously planned.  Can do an A1c at that time has been in control.   Expectant management and discussion of plan and treatment with opportunity to ask questions and all were answered. The patient agreed with the plan and demonstrated an understanding of the instructions.   Advised to call back or seek an in-person evaluation if worsening  or having  further concerns . Return for Keep appointment scheduled in January 2021.     Shanon Ace, MD    Below is   Plan assessment from physical medicine  Patient is a 67 yr old male with A fib, sepsis of [place] joint, DM, osteomyelitis, diskitis, and post op pain here for f/u.   1. Suggest pushing your back forward- like pregnant- multiple times per day for back stretching  2. Needs PICC line out- if issues, let me know and I can order the discontinuation  3. Con't PT and OT- for as long as necessary- do Home exercise program- at least 5 days/week.  4. Has access to handicapped placard.  5. Have to check  with Neurosurgery about driving- would work on coordination before driving again.  6. Refill tramadol 50 mg QID prn #120 with 1 RF and Robaxin 500 mg QID prn for muscle relaxation.  7. Wean Paxil if want to come off- suggest Paxil at least 3 months before weaning.  8. Sleep isn't going as well- trazodone 25-50 mg nightly as needed for sleep.  9. F/U in 2 months- earlier if needed; call if necessary-   10. If needs tramadol past 2 months, will need controlled substances contract and UDS at that point,

## 2019-10-28 NOTE — Progress Notes (Signed)
[address]       [place] [address]             [phone]      [name]. [place] Record [id number] Date of Birth: [birth year]  Referring: Aline August, MD Primary Care: Burnis Medin, MD Primary Cardiologist: No primary care provider on file.   Chief Complaint:   POST OP FOLLOW UP OPERATIVE REPORT DATE OF PROCEDURE:  08/31/2019 PREOPERATIVE DIAGNOSIS:  Infected left sternoclavicular joint. POSTOPERATIVE DIAGNOSIS:  Infected left sternoclavicular joint. SURGICAL PROCEDURE:  Incision and drainage and debridement of left sternoclavicular joint with application of wound VAC.  History of Present Illness:     Patient returns to the office today after prolonged hospitalization and time in rehab when he presented with underlying sepsis infected sternoclavicular joint which was debrided on September 1.  Patient has been on appropriate directed course of IV antibiotics just recently stopped iodine with transition to p.o. Keflex.    2 weeks ago he was seen in the office and we continued the wound VAC to the left upper chest incision for additional 2 weeks.  Today the incision is much smaller does not undermine superiorly approximately 1 x 2 cm.  Patient notes that he is having less back pain, is ambulating without the walker support, though he still uses it for safety reasons.  Infectious disease had continued his antibiotics p.o. he has 1 to 2 days left  Past Medical History:  Diagnosis Date  . Atrial fibrillation ([place]) 03/22/2009   a. s/p multiple DCCV; b. no coumadin due to low TE risk profile; c. Tikosyn Rx  . Coronary atherosclerosis of native coronary artery 11/2002   a. s/p stent to LAD 12/03; OM2 occluded at cath 12/03; d. myoview 5/10: no ischemia;  e. echo 7/11: EF 55%, BAE, mild RVE, PASP 41-45; Myoview was in March 2013. There was no ischemia or infarction, EF 51%   . Cutaneous abscess of back excluding buttocks 07/04/2014   Appears to stem from possibly a cyst very large area 6 cm contact surgeon office   . Diabetes mellitus without complication ([place])   . Drusen body    see opth note  . ERECTILE DYSFUNCTION 03/22/2009  . GERD 03/22/2009  . HYPERGLYCEMIA 04/25/2010  . HYPERLIPIDEMIA 03/22/2009  . 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 LATERAL EPICONDYLITIS, LEFT 10/24/2009  . LIVER FUNCTION TESTS, ABNORMAL 04/25/2010  . Local reaction to immunization 05/05/2012   minor resolving  zostavax   . Myocardial infarction ([place]) mi2003  . Numbness in left leg    foot related to back disease and surgery  . Obesity, unspecified 04/24/2009  . Perforated appendicitis with necrosis s/p open appendectomy 06/07/14 06/04/2014  . Renal cyst    Characterized by MRI as simple  . Ruptured suppurative appendicitis    2015   . SLEEP APNEA, OBSTRUCTIVE 03/22/2009   compliant with CPAP  . THROMBOCYTOPENIA 08/16/2010  . TOBACCO USE, QUIT 10/24/2009  . 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 Umbilical hernia      Social History   Tobacco Use  Smoking Status Former Smoker  . Packs/day: 2.00  . Years: 42.00  . Pack years: 84.00  . Types: Cigarettes  . Quit date: 12/30/2008  . Years since quitting: 10.8  Smokeless Tobacco Never Used  Tobacco Comment   started at age 64; 1-2 ppd; quit 2010    Social  History   Substance and Sexual Activity  Alcohol Use No  . Alcohol/week: 0.0 standard drinks   Comment: rarely; maybe 1 beer a year     Allergies  Allergen Reactions  . Pseudoephedrine Other (See Comments)    Patient went into afib  . Diltiazem Rash and Itching    Also 2019  . Novocain [Procaine] Hives    Dentist appointment in 1968; since then has tolerated lidocaine and provocaine with no hives or difficulty.  . Quinolones     Patient was warned about not using Cipro and similar antibiotics. Recent studies have raised concern that fluoroquinolone antibiotics could be associated with an  increased risk of aortic aneurysm Fluoroquinolones have non-antimicrobial properties that might jeopardise the integrity of the extracellular matrix of the vascular wall In a  propensity score matched cohort study in [place], there was a 66% increased rate of aortic aneurysm or dissection associated with oral fluoroquinolone use, compared wit  . Pseudoephedrine Hcl Palpitations  . Sulfonamide Derivatives Other (See Comments)    Childhood reaction     Current Outpatient Medications  Medication Sig Dispense Refill  . acetaminophen (TYLENOL) 325 MG tablet Take 650 mg by mouth every 6 (six) hours as needed for mild pain.    [name] albuterol (PROVENTIL HFA;VENTOLIN HFA) 108 (90 Base) MCG/ACT inhaler Inhale 2 puffs into the lungs every 6 (six) hours as needed for wheezing or shortness of breath. 1 Inhaler 0  . ALPHAGAN P 0.1 % SOLN Place 1 drop into both eyes 3 (three) times daily.   3  . Amino Acids-Protein Hydrolys (FEEDING SUPPLEMENT, PRO-STAT SUGAR FREE 64,) LIQD Take 30 mLs by mouth 2 (two) times daily. 887 mL 0  . atorvastatin (LIPITOR) 80 MG tablet TAKE 1 TABLET BY MOUTH  DAILY (Patient taking differently: Take 80 mg by mouth at bedtime. ) 90 tablet 1  . diphenhydrAMINE (BENADRYL) 25 MG tablet Take 25 mg by mouth at bedtime as needed for sleep.    [name] dofetilide (TIKOSYN) 500 MCG capsule Take 1 capsule (500 mcg total) by mouth 2 (two) times daily. 180 capsule 2  . famotidine (PEPCID) 10 MG tablet Take 10 mg by mouth as needed for heartburn.     . fexofenadine (ALLEGRA) 180 MG tablet Take 180 mg by mouth daily.    . fish oil-omega-3 fatty acids 1000 MG capsule Take 1 g by mouth 2 (two) times daily.     . fluticasone (FLONASE) 50 MCG/ACT nasal spray Place 2 sprays into both nostrils daily as needed for allergies. 48 g prn  . fluticasone furoate-vilanterol (BREO ELLIPTA) 200-25 MCG/INH AEPB Inhale 1 puff into the lungs daily.     [name] lidocaine (LIDODERM) 5 % Place 2 patches onto the skin daily. Apply to  lumbar spine at 7 am and remove at 7 pm daily 60 patch 0  . magnesium oxide (MAG-OX) 400 (241.3 Mg) MG tablet Take 1 tablet (400 mg total) by mouth daily. 30 tablet 0  . Menthol-Methyl Salicylate (MUSCLE RUB) 10-15 % CREA Apply 1 application topically as needed for muscle pain.  0  . metFORMIN (GLUCOPHAGE-XR) 500 MG 24 hr tablet Take 1 tablet (500 mg total) by mouth 3 (three) times daily.    . methocarbamol (ROBAXIN) 500 MG tablet Take 1 tablet (500 mg total) by mouth 4 (four) times daily as needed for muscle spasms. 120 tablet 2  . MULTIPLE VITAMIN PO Take 1 tablet by mouth every evening.     . nitroGLYCERIN (NITROSTAT) 0.4 MG  SL tablet Place 1 tablet (0.4 mg total) under the tongue every 5 (five) minutes as needed for chest pain. 25 tablet 12  . ondansetron (ZOFRAN ODT) 4 MG disintegrating tablet Take 1 tablet (4 mg total) by mouth every 8 (eight) hours as needed for nausea or vomiting. 24 tablet 0  . ONETOUCH VERIO test strip USE TWICE A DAY 50 each 12  . pantoprazole (PROTONIX) 40 MG tablet Take 40 mg by mouth daily.    [name] PARoxetine (PAXIL) 20 MG tablet TAKE 1 TABLET BY MOUTH EVERYDAY AT BEDTIME 30 tablet 0  . potassium chloride SA (KLOR-CON M20) 20 MEQ tablet Take 1 tablet (20 mEq total) by mouth 2 (two) times daily. 60 tablet 0  . PRADAXA 150 MG CAPS capsule TAKE 1 CAPSULE BY MOUTH  EVERY 12 HOURS 180 capsule 3  . senna-docusate (SENOKOT-S) 8.6-50 MG tablet Take 1 tablet by mouth 2 (two) times daily. 60 tablet 0  . tamsulosin (FLOMAX) 0.4 MG CAPS capsule TAKE 1 CAPSULE BY MOUTH EVERY DAY (Patient taking differently: Take 0.4 mg by mouth daily as needed (repeat kidney stone). ) 30 capsule 5  . traMADol (ULTRAM) 50 MG tablet Take 1 tablet (50 mg total) by mouth 4 (four) times daily as needed for severe pain. 28 tablet 0  . traMADol (ULTRAM) 50 MG tablet Take 1 tablet (50 mg total) by mouth every 6 (six) hours as needed. 120 tablet 1  . traZODone (DESYREL) 50 MG tablet Take 1 tablet (50 mg total)  by mouth at bedtime. 30 tablet 5  . triamcinolone cream (KENALOG) 0.1 % Apply 1 application topically 2 (two) times daily. As needed. (Patient taking differently: Apply 1 application topically 2 (two) times daily as needed (rash). ) 30 g 0   No current facility-administered medications for this visit.        Physical Exam: BP 118/70 (BP Location: Right Arm)   Pulse 81   Temp 97.7 F (36.5 C) (Skin)   Resp 16   Wt 243 lb 9.6 oz (110.5 kg)   SpO2 93% Comment: RA  BMI 32.14 kg/m  General appearance: alert, cooperative and no distress Head: Normocephalic, without obvious abnormality, atraumatic Lymph nodes: Cervical, supraclavicular, and axillary nodes normal. Resp: clear to auscultation bilaterally Cardio: regular rate and rhythm, S1, S2 normal, no murmur, click, rub or gallop GI: soft, non-tender; bowel sounds normal; no masses,  no organomegaly Extremities: extremities normal, atraumatic, no cyanosis or edema and Homans sign is negative, no sign of DVT Neurologic: Grossly normal Left upper chest incision no longer is undermining superiorly, approximately 2 cm x 1 cm of red noninfected granulation tissue is exposed  Diagnostic Studies & Laboratory data:     Recent Radiology Findings:   No results found.    Recent Lab Findings: Lab Results  Component Value Date   WBC 8.8 09/30/2019   HGB 13.1 09/30/2019   HCT 38.3 (L) 09/30/2019   PLT 159 09/30/2019   GLUCOSE 142 (H) 09/30/2019   CHOL 85 02/08/2019   TRIG 110.0 02/08/2019   HDL 30.00 (L) 02/08/2019   LDLCALC 33 02/08/2019   ALT 25 09/30/2019   AST 38 09/30/2019   NA 137 09/30/2019   K 4.2 09/30/2019   CL 104 09/30/2019   CREATININE 0.86 09/30/2019   BUN 28 (H) 09/30/2019   CO2 25 09/30/2019   TSH 1.414 08/27/2019   INR 1.2 08/31/2019   HGBA1C 6.7 (H) 07/21/2019      Assessment / Plan:  Patient's left upper chest, incision is improved we will stop the wound VAC and switch to wet-to-dry dressings until we  have complete skin coverage Patient will return in 3 weeks for follow-up wound check  He notes he has infectious disease and neurosurgery goal follow-up appointments in the next week.  Medication Changes: No orders of the defined types were placed in this encounter.     Grace Isaac MD      [address] [place] [address] Office [phone]   Beeper [phone]  10/28/2019 9:29 AM

## 2019-11-02 ENCOUNTER — Other Ambulatory Visit: Payer: Self-pay | Admitting: Physical Medicine and Rehabilitation

## 2019-11-15 ENCOUNTER — Other Ambulatory Visit: Payer: Self-pay | Admitting: Internal Medicine

## 2019-11-18 ENCOUNTER — Other Ambulatory Visit: Payer: Self-pay

## 2019-11-18 ENCOUNTER — Ambulatory Visit: Payer: Self-pay | Admitting: Cardiothoracic Surgery

## 2019-11-18 ENCOUNTER — Ambulatory Visit (INDEPENDENT_AMBULATORY_CARE_PROVIDER_SITE_OTHER): Payer: Medicare Other | Admitting: Infectious Diseases

## 2019-11-18 VITALS — BP 143/73 | HR 57 | Temp 97.7°F | Wt 258.0 lb

## 2019-11-18 VITALS — BP 144/67 | HR 63 | Temp 97.7°F | Resp 16 | Wt 250.0 lb

## 2019-11-18 DIAGNOSIS — M009 Pyogenic arthritis, unspecified: Secondary | ICD-10-CM

## 2019-11-18 DIAGNOSIS — B9561 Methicillin susceptible Staphylococcus aureus infection as the cause of diseases classified elsewhere: Secondary | ICD-10-CM

## 2019-11-18 DIAGNOSIS — R7881 Bacteremia: Secondary | ICD-10-CM | POA: Diagnosis not present

## 2019-11-18 DIAGNOSIS — M4646 Discitis, unspecified, lumbar region: Secondary | ICD-10-CM

## 2019-11-18 DIAGNOSIS — Z452 Encounter for adjustment and management of vascular access device: Secondary | ICD-10-CM

## 2019-11-18 NOTE — Patient Instructions (Addendum)
So nice to see you today!  I think your infection has been cured. I will check some blood work for you today. No further antibiotics needed at this time. I suspect that your back will continue to improve and heal. It may not be 100% back to normal and you may notice some lingering pain from time to time but it should in no way start getting worse.   I would like to check in with you one more time in 2 months to see how you back pain is.   We can do a video visit for this for your safety given I am uncertain what COVID will look like.

## 2019-11-18 NOTE — Progress Notes (Signed)
[address]       [name],[name] [address]             [phone]      [name] [name] Record [id number] Date of Birth: [birth year]  Referring: [name] August, MD Primary Care: [name] Medin, MD Primary Cardiologist: No primary care provider on file.   Chief Complaint:   POST OP FOLLOW UP OPERATIVE REPORT DATE OF PROCEDURE:  08/31/2019 PREOPERATIVE DIAGNOSIS:  Infected left sternoclavicular joint. POSTOPERATIVE DIAGNOSIS:  Infected left sternoclavicular joint. SURGICAL PROCEDURE:  Incision and drainage and debridement of left sternoclavicular joint with application of wound VAC.  History of Present Illness:     Patient returns to the office today after prolonged hospitalization and time in rehab when he presented with underlying sepsis infected sternoclavicular joint which was debrided on September 1.   Patient continues to improve, he is returning to near normal activities.  He says he goes out daily.  He denies any recurrent fever or chills.  He has now been off antibiotics for 2 weeks.     Past Medical History:  Diagnosis Date   Atrial fibrillation ([place]) 03/22/2009   a. s/p multiple DCCV; b. no coumadin due to low TE risk profile; c. Tikosyn Rx   Coronary atherosclerosis of native coronary artery 11/2002   a. s/p stent to LAD 12/03; OM2 occluded at cath 12/03; d. myoview 5/10: no ischemia;  e. echo 7/11: EF 55%, BAE, mild RVE, PASP 41-45; Myoview was in March 2013. There was no ischemia or infarction, EF 51%    Cutaneous abscess of back excluding buttocks 07/04/2014   Appears to stem from possibly a cyst very large area 6 cm contact surgeon office    Diabetes mellitus without complication ([place])    Drusen body    see opth note   ERECTILE DYSFUNCTION 03/22/2009   GERD 03/22/2009   HYPERGLYCEMIA 04/25/2010   HYPERLIPIDEMIA 03/22/2009   Iliac aneurysm ([place])    2.6 to be evaluated incidental finding on CT   LATERAL  EPICONDYLITIS, LEFT 10/24/2009   LIVER FUNCTION TESTS, ABNORMAL 04/25/2010   Local reaction to immunization 05/05/2012   minor resolving  zostavax    Myocardial infarction [facility]) mi2003   Numbness in left leg    foot related to back disease and surgery   Obesity, unspecified 04/24/2009   Perforated appendicitis with necrosis s/p open appendectomy 06/07/14 06/04/2014   Renal cyst    Characterized by MRI as simple   Ruptured suppurative appendicitis    2015    SLEEP APNEA, OBSTRUCTIVE 03/22/2009   compliant with CPAP   THROMBOCYTOPENIA 08/16/2010   TOBACCO USE, QUIT 10/24/2009   ULNAR NEUROPATHY, LEFT [id number]   Umbilical hernia      Social History   Tobacco Use  Smoking Status Former Smoker   Packs/day: 2.00   Years: 42.00   Pack years: 84.00   Types: Cigarettes   Quit date: 12/30/2008   Years since quitting: 10.8  Smokeless Tobacco Never Used  Tobacco Comment   started at age 64; 1-2 ppd; quit 2010    Social History   Substance and Sexual Activity  Alcohol Use No   Alcohol/week: 0.0 standard drinks   Comment: rarely; maybe 1 beer a year     Allergies  Allergen Reactions   Pseudoephedrine Other (See Comments)    Patient went into afib   Diltiazem Rash and Itching    Also 2019   Novocain [  Procaine] Hives    Dentist appointment in 1968; since then has tolerated lidocaine and provocaine with no hives or difficulty.   Quinolones     Patient was warned about not using Cipro and similar antibiotics. Recent studies have raised concern that fluoroquinolone antibiotics could be associated with an increased risk of aortic aneurysm Fluoroquinolones have non-antimicrobial properties that might jeopardise the integrity of the extracellular matrix of the vascular wall In a  propensity score matched cohort study in [place], there was a 66% increased rate of aortic aneurysm or dissection associated with oral fluoroquinolone use, compared wit   Pseudoephedrine Hcl  Palpitations   Sulfonamide Derivatives Other (See Comments)    Childhood reaction     Current Outpatient Medications  Medication Sig Dispense Refill   acetaminophen (TYLENOL) 325 MG tablet Take 650 mg by mouth every 6 (six) hours as needed for mild pain.     albuterol (PROVENTIL HFA;VENTOLIN HFA) 108 (90 Base) MCG/ACT inhaler Inhale 2 puffs into the lungs every 6 (six) hours as needed for wheezing or shortness of breath. 1 Inhaler 0   ALPHAGAN P 0.1 % SOLN Place 1 drop into both eyes 3 (three) times daily.   3   Amino Acids-Protein Hydrolys (FEEDING SUPPLEMENT, PRO-STAT SUGAR FREE 64,) LIQD Take 30 mLs by mouth 2 (two) times daily. 887 mL 0   atorvastatin (LIPITOR) 80 MG tablet TAKE 1 TABLET BY MOUTH  DAILY (Patient taking differently: Take 80 mg by mouth at bedtime. ) 90 tablet 1   BREO ELLIPTA 200-25 MCG/INH AEPB INHALE 1 PUFF INTO THE LUNGS EVERY DAY 60 each 2   [employer] SENNA PLUS 8.6-50 MG tablet TAKE 1 TABLET BY MOUTH TWICE A DAY 60 tablet 0   diphenhydrAMINE (BENADRYL) 25 MG tablet Take 25 mg by mouth at bedtime as needed for sleep.     dofetilide (TIKOSYN) 500 MCG capsule Take 1 capsule (500 mcg total) by mouth 2 (two) times daily. 180 capsule 2   famotidine (PEPCID) 10 MG tablet Take 10 mg by mouth as needed for heartburn.      fexofenadine (ALLEGRA) 180 MG tablet Take 180 mg by mouth daily.     fish oil-omega-3 fatty acids 1000 MG capsule Take 1 g by mouth 2 (two) times daily.      fluticasone (FLONASE) 50 MCG/ACT nasal spray Place 2 sprays into both nostrils daily as needed for allergies. 48 g prn   lidocaine (LIDODERM) 5 % Place 2 patches onto the skin daily. Apply to lumbar spine at 7 am and remove at 7 pm daily 60 patch 0   magnesium oxide (MAG-OX) 400 (241.3 Mg) MG tablet Take 1 tablet (400 mg total) by mouth daily. 30 tablet 0   Menthol-Methyl Salicylate (MUSCLE RUB) 10-15 % CREA Apply 1 application topically as needed for muscle pain.  0   metFORMIN  (GLUCOPHAGE-XR) 500 MG 24 hr tablet Take 1 tablet (500 mg total) by mouth 3 (three) times daily.     methocarbamol (ROBAXIN) 500 MG tablet Take 1 tablet (500 mg total) by mouth 4 (four) times daily as needed for muscle spasms. 120 tablet 2   MULTIPLE VITAMIN PO Take 1 tablet by mouth every evening.      nitroGLYCERIN (NITROSTAT) 0.4 MG SL tablet Place 1 tablet (0.4 mg total) under the tongue every 5 (five) minutes as needed for chest pain. 25 tablet 12   ondansetron (ZOFRAN ODT) 4 MG disintegrating tablet Take 1 tablet (4 mg total) by mouth every 8 (eight)  hours as needed for nausea or vomiting. 24 tablet 0   ONETOUCH VERIO test strip USE TWICE A DAY 50 each 12   pantoprazole (PROTONIX) 40 MG tablet Take 40 mg by mouth daily.     PARoxetine (PAXIL) 20 MG tablet TAKE 1 TABLET BY MOUTH EVERYDAY AT BEDTIME 30 tablet 5   potassium chloride SA (KLOR-CON M20) 20 MEQ tablet Take 1 tablet (20 mEq total) by mouth 2 (two) times daily. 60 tablet 0   PRADAXA 150 MG CAPS capsule TAKE 1 CAPSULE BY MOUTH  EVERY 12 HOURS 180 capsule 3   tamsulosin (FLOMAX) 0.4 MG CAPS capsule TAKE 1 CAPSULE BY MOUTH EVERY DAY (Patient taking differently: Take 0.4 mg by mouth daily as needed (repeat kidney stone). ) 30 capsule 5   traMADol (ULTRAM) 50 MG tablet Take 1 tablet (50 mg total) by mouth 4 (four) times daily as needed for severe pain. 28 tablet 0   traMADol (ULTRAM) 50 MG tablet Take 1 tablet (50 mg total) by mouth every 6 (six) hours as needed. 120 tablet 1   traZODone (DESYREL) 50 MG tablet Take 1 tablet (50 mg total) by mouth at bedtime. 30 tablet 5   triamcinolone cream (KENALOG) 0.1 % Apply 1 application topically 2 (two) times daily. As needed. (Patient taking differently: Apply 1 application topically 2 (two) times daily as needed (rash). ) 30 g 0   No current facility-administered medications for this visit.        Physical Exam: BP (!) 144/67 (BP Location: Right Arm)    Pulse 63    Temp 97.7 F  (36.5 C) (Skin)    Resp 16    Wt 113.4 kg    SpO2 94% Comment: RA   BMI 32.98 kg/m  General appearance: alert and cooperative Head: Normocephalic, without obvious abnormality, atraumatic Neck: no adenopathy, no carotid bruit, no JVD, supple, symmetrical, trachea midline and thyroid not enlarged, symmetric, no tenderness/mass/nodules Lymph nodes: Cervical, supraclavicular, and axillary nodes normal. Resp: clear to auscultation bilaterally Back: symmetric, no curvature. ROM normal. No CVA tenderness. GI: soft, non-tender; bowel sounds normal; no masses,  no organomegaly Extremities: extremities normal, atraumatic, no cyanosis or edema and Homans sign is negative, no sign of DVT Neurologic: Grossly normal The site at the left sternoclavicular joint is now almost completely granulated in without any evidence of infection.  Diagnostic Studies & Laboratory data:     Recent Radiology Findings:   No results found.    Recent Lab Findings: Lab Results  Component Value Date   WBC 8.8 09/30/2019   HGB 13.1 09/30/2019   HCT 38.3 (L) 09/30/2019   PLT 159 09/30/2019   GLUCOSE 142 (H) 09/30/2019   CHOL 85 02/08/2019   TRIG 110.0 02/08/2019   HDL 30.00 (L) 02/08/2019   LDLCALC 33 02/08/2019   ALT 25 09/30/2019   AST 38 09/30/2019   NA 137 09/30/2019   K 4.2 09/30/2019   CL 104 09/30/2019   CREATININE 0.86 09/30/2019   BUN 28 (H) 09/30/2019   CO2 25 09/30/2019   TSH 1.414 08/27/2019   INR 1.2 08/31/2019   HGBA1C 6.7 (H) 07/21/2019      Assessment / Plan:  Patient stable with almost completely healed left sternoclavicular joint Patient will continue with local wound care Plan to see him back for wound check in 3 weeks     Medication Changes: No orders of the defined types were placed in this encounter.     Grace Isaac MD  [address] [place] [address] Office [phone]   Beeper [phone]  11/18/2019 4:12 PM

## 2019-11-18 NOTE — Progress Notes (Signed)
Patient: [name] [name].  DOB: 10/03/1951 MRN: [id number] PCP: [name] Medin, MD      Patient Active Problem List   Diagnosis Date Noted  . Discitis of lumbar region 08/30/2019    Priority: High  . Septic arthritis of sternoclavicular joint, left ([name] [name]) 08/30/2019    Priority: High  . Elevated BUN   . Postoperative pain   . Controlled type 2 diabetes mellitus with hyperglycemia, without long-term current use of insulin ([place])   . Right thalamic infarction ([name] [name])   . Hypoalbuminemia due to protein-calorie malnutrition ([place])   . Diabetes mellitus type 2 in obese ([place])   . Diskitis 09/15/2019  . Left shoulder pain 08/30/2019  . Myelopathy ([place]) 08/27/2019  . H/O optic neuritis 08/27/2019  . Right hip pain 09/04/2017  . Thrombocytopenia ([place]) 06/25/2016  . Atrial fibrillation ([place]) 01/01/2016  . 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 Iliac aneurysm ([place])   . Morbid obesity ([place]) 07/08/2014  . Type 2 diabetes mellitus with other circulatory complications ([place]) 80/99/8338  . Chronic anticoagulation 12/21/2013  . Sleep disorder 06/21/2013  . Decreased hearing 08/06/2012  . Hemorrhoids 08/06/2012  . High risk medication use 05/05/2012  . Anticoagulation management encounter 05/05/2012  . Retinal tear 04/29/2012  . Personal history of colonic polyps 05/05/2011  . Visit for preventive health examination 05/05/2011  . PALPITATIONS 08/16/2010  . LIVER FUNCTION TESTS, ABNORMAL 04/25/2010  . Lateral epicondylitis 10/24/2009  . TOBACCO USE, QUIT 10/24/2009  . OBESITY, UNSPECIFIED 04/24/2009  . Hyperlipidemia 03/22/2009  . ERECTILE DYSFUNCTION 03/22/2009  . Obstructive sleep apnea 03/22/2009  . 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 MYOCARDIAL INFARCTION, HX OF 03/22/2009  . Coronary atherosclerosis 03/22/2009  . Atrial fibrillation with RVR ([place]) 03/22/2009  . GERD 03/22/2009     Subjective:  [name] [name]. is a 68 y.o. male here for hospital follow up.  PMHx  diabetes, optic nerve neuritis.   Brief ID Hx:  Admitted 08/27/2019 with fevers, acutely worsened back pain and AMS. Blood cultures grew MSSA in all 4 bottles. 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 He was also found to have septic arthritis of the left sternoclavicular joint, for which he underwent debridement 9/01 with [name] [name].  There was concern for contiguous meningitis by neurology team; he was treated with 2 weeks Nafcillin then transitioned to cefazolin to complete 6 weeks of IV therapy through 10/12/2019. He required inpatient rehab prior to discharge home.    CC:  Follow up MSSA discitis and [name] septic arthritis.    HPI:  [name] [name] has been doing remarkably well since our last office visit together 6 weeks ago.  He states that during the last week of his cephalexin he really noticed a significant improvement in his pain. He now only needs to take one robaxin a day now for comfort. No other pain medication is needed. He does still have some stiffness and mild pain (2-3/10) from time to time but nothing that escalates. He graduated from PT/OT. No longer using a walking aid at home and doing all home chores now. No fevers/chills. He had follow up with his neurosurgeon and was cleared for PRN follow up and no surgical needs at this time.   He just came from [name] [name] office for follow up on L [place] joint septic arthritis. Wound vac has been off for about a month now and there is "good granulation tissue that has nearly completely healed." He still keeps this  covered but likely will no longer need home health team assistance any further.     Review of Systems  Constitutional: Negative for chills and fever.  HENT: Negative for tinnitus.   Eyes: Negative for blurred vision and photophobia.  Respiratory: Negative for cough and sputum production.   Cardiovascular: Negative for chest pain.  Gastrointestinal: Negative for diarrhea, nausea and vomiting.  Genitourinary:  Negative for dysuria.  Skin: Negative for rash.  Neurological: Negative for headaches.    Past Medical History:  Diagnosis Date  . Atrial fibrillation ([place]) 03/22/2009   a. s/p multiple DCCV; b. no coumadin due to low TE risk profile; c. Tikosyn Rx  . Coronary atherosclerosis of native coronary artery 11/2002   a. s/p stent to LAD 12/03; OM2 occluded at cath 12/03; d. myoview 5/10: no ischemia;  e. echo 7/11: EF 55%, BAE, mild RVE, PASP 41-45; Myoview was in March 2013. There was no ischemia or infarction, EF 51%   . Cutaneous abscess of back excluding buttocks 07/04/2014   Appears to stem from possibly a cyst very large area 6 cm contact surgeon office   . Diabetes mellitus without complication ([place])   . Drusen body    see opth note  . ERECTILE DYSFUNCTION 03/22/2009  . GERD 03/22/2009  . HYPERGLYCEMIA 04/25/2010  . HYPERLIPIDEMIA 03/22/2009  . 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 LATERAL EPICONDYLITIS, LEFT 10/24/2009  . LIVER FUNCTION TESTS, ABNORMAL 04/25/2010  . Local reaction to immunization 05/05/2012   minor resolving  zostavax   . Myocardial infarction ([employer]) mi2003  . Numbness in left leg    foot related to back disease and surgery  . Obesity, unspecified 04/24/2009  . Perforated appendicitis with necrosis s/p open appendectomy 06/07/14 06/04/2014  . Renal cyst    Characterized by MRI as simple  . Ruptured suppurative appendicitis    2015   . SLEEP APNEA, OBSTRUCTIVE 03/22/2009   compliant with CPAP  . THROMBOCYTOPENIA 08/16/2010  . TOBACCO USE, QUIT 10/24/2009  . 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 Umbilical hernia     Outpatient Medications Prior to Visit  Medication Sig Dispense Refill  . acetaminophen (TYLENOL) 325 MG tablet Take 650 mg by mouth every 6 (six) hours as needed for mild pain.    [name] albuterol (PROVENTIL HFA;VENTOLIN HFA) 108 (90 Base) MCG/ACT inhaler Inhale 2 puffs into the lungs every 6 (six) hours as needed for wheezing or  shortness of breath. 1 Inhaler 0  . ALPHAGAN P 0.1 % SOLN Place 1 drop into both eyes 3 (three) times daily.   3  . Amino Acids-Protein Hydrolys (FEEDING SUPPLEMENT, PRO-STAT SUGAR FREE 64,) LIQD Take 30 mLs by mouth 2 (two) times daily. 887 mL 0  . atorvastatin (LIPITOR) 80 MG tablet TAKE 1 TABLET BY MOUTH  DAILY (Patient taking differently: Take 80 mg by mouth at bedtime. ) 90 tablet 1  . BREO ELLIPTA 200-25 MCG/INH AEPB INHALE 1 PUFF INTO THE LUNGS EVERY DAY 60 each 2  . [employer]S SENNA PLUS 8.6-50 MG tablet TAKE 1 TABLET BY MOUTH TWICE A DAY 60 tablet 0  . diphenhydrAMINE (BENADRYL) 25 MG tablet Take 25 mg by mouth at bedtime as needed for sleep.    [name] dofetilide (TIKOSYN) 500 MCG capsule Take 1 capsule (500 mcg total) by mouth 2 (two) times daily. 180 capsule 2  . famotidine (PEPCID) 10 MG tablet Take 10 mg by mouth as needed for heartburn.     [name]  fexofenadine (ALLEGRA) 180 MG tablet Take 180 mg by mouth daily.    . fish oil-omega-3 fatty acids 1000 MG capsule Take 1 g by mouth 2 (two) times daily.     . fluticasone (FLONASE) 50 MCG/ACT nasal spray Place 2 sprays into both nostrils daily as needed for allergies. 48 g prn  . lidocaine (LIDODERM) 5 % Place 2 patches onto the skin daily. Apply to lumbar spine at 7 am and remove at 7 pm daily 60 patch 0  . magnesium oxide (MAG-OX) 400 (241.3 Mg) MG tablet Take 1 tablet (400 mg total) by mouth daily. 30 tablet 0  . Menthol-Methyl Salicylate (MUSCLE RUB) 10-15 % CREA Apply 1 application topically as needed for muscle pain.  0  . metFORMIN (GLUCOPHAGE-XR) 500 MG 24 hr tablet Take 1 tablet (500 mg total) by mouth 3 (three) times daily.    . methocarbamol (ROBAXIN) 500 MG tablet Take 1 tablet (500 mg total) by mouth 4 (four) times daily as needed for muscle spasms. 120 tablet 2  . MULTIPLE VITAMIN PO Take 1 tablet by mouth every evening.     . nitroGLYCERIN (NITROSTAT) 0.4 MG SL tablet Place 1 tablet (0.4 mg total) under the tongue every 5 (five) minutes as  needed for chest pain. 25 tablet 12  . ondansetron (ZOFRAN ODT) 4 MG disintegrating tablet Take 1 tablet (4 mg total) by mouth every 8 (eight) hours as needed for nausea or vomiting. 24 tablet 0  . ONETOUCH VERIO test strip USE TWICE A DAY 50 each 12  . pantoprazole (PROTONIX) 40 MG tablet Take 40 mg by mouth daily.    [name] PARoxetine (PAXIL) 20 MG tablet TAKE 1 TABLET BY MOUTH EVERYDAY AT BEDTIME 30 tablet 5  . potassium chloride SA (KLOR-CON M20) 20 MEQ tablet Take 1 tablet (20 mEq total) by mouth 2 (two) times daily. 60 tablet 0  . PRADAXA 150 MG CAPS capsule TAKE 1 CAPSULE BY MOUTH  EVERY 12 HOURS 180 capsule 3  . tamsulosin (FLOMAX) 0.4 MG CAPS capsule TAKE 1 CAPSULE BY MOUTH EVERY DAY (Patient taking differently: Take 0.4 mg by mouth daily as needed (repeat kidney stone). ) 30 capsule 5  . traMADol (ULTRAM) 50 MG tablet Take 1 tablet (50 mg total) by mouth 4 (four) times daily as needed for severe pain. 28 tablet 0  . traMADol (ULTRAM) 50 MG tablet Take 1 tablet (50 mg total) by mouth every 6 (six) hours as needed. 120 tablet 1  . traZODone (DESYREL) 50 MG tablet Take 1 tablet (50 mg total) by mouth at bedtime. 30 tablet 5  . triamcinolone cream (KENALOG) 0.1 % Apply 1 application topically 2 (two) times daily. As needed. (Patient taking differently: Apply 1 application topically 2 (two) times daily as needed (rash). ) 30 g 0   No facility-administered medications prior to visit.      Allergies  Allergen Reactions  . Pseudoephedrine Other (See Comments)    Patient went into afib  . Diltiazem Rash and Itching    Also 2019  . Novocain [Procaine] Hives    Dentist appointment in 1968; since then has tolerated lidocaine and provocaine with no hives or difficulty.  . Quinolones     Patient was warned about not using Cipro and similar antibiotics. Recent studies have raised concern that fluoroquinolone antibiotics could be associated with an increased risk of aortic aneurysm Fluoroquinolones  have non-antimicrobial properties that might jeopardise the integrity of the extracellular matrix of the vascular wall In  a  propensity score matched cohort study in [place], there was a 66% increased rate of aortic aneurysm or dissection associated with oral fluoroquinolone use, compared wit  . Pseudoephedrine Hcl Palpitations  . Sulfonamide Derivatives Other (See Comments)    Childhood reaction     Social History   Tobacco Use  . Smoking status: Former Smoker    Packs/day: 2.00    Years: 42.00    Pack years: 84.00    Types: Cigarettes    Quit date: 12/30/2008    Years since quitting: 10.8  . Smokeless tobacco: Never Used  . Tobacco comment: started at age 24; 1-2 ppd; quit 2010  Substance Use Topics  . Alcohol use: No    Alcohol/week: 0.0 standard drinks    Comment: rarely; maybe 1 beer a year  . Drug use: No     Objective:   Vitals:   11/18/19 1338  BP: (!) 143/73  Pulse: (!) 57  Temp: 97.7 F (36.5 C)  TempSrc: Oral  Weight: 258 lb (117 kg)   Body mass index is 34.04 kg/m.  Physical Exam Vitals signs reviewed.  Constitutional:      Comments: He is well appearing today. Pleasant.   HENT:     Mouth/Throat:     Mouth: No oral lesions.     Dentition: Normal dentition. No dental caries.  Eyes:     General: No scleral icterus. Cardiovascular:     Rate and Rhythm: Normal rate and regular rhythm.     Heart sounds: Normal heart sounds.  Pulmonary:     Effort: Pulmonary effort is normal.     Breath sounds: Normal breath sounds.  Abdominal:     General: There is no distension.     Palpations: Abdomen is soft.     Tenderness: There is no abdominal tenderness.  Musculoskeletal:     Comments: Sitting comfortably in chair. No walking aid required. Normal sit to stand effort. Normal gait. No midline tenderness over spine.   Lymphadenopathy:     Cervical: No cervical adenopathy.  Skin:    General: Skin is warm and dry.     Capillary Refill: Capillary refill takes  less than 2 seconds.     Findings: No rash.  Neurological:     Mental Status: He is alert and oriented to person, place, and time.     Lab Results: Lab Results  Component Value Date   WBC 8.8 09/30/2019   HGB 13.1 09/30/2019   HCT 38.3 (L) 09/30/2019   MCV 93.4 09/30/2019   PLT 159 09/30/2019    Lab Results  Component Value Date   CREATININE 0.86 09/30/2019   BUN 28 (H) 09/30/2019   NA 137 09/30/2019   K 4.2 09/30/2019   CL 104 09/30/2019   CO2 25 09/30/2019    Lab Results  Component Value Date   ALT 25 09/30/2019   AST 38 09/30/2019   ALKPHOS 89 09/30/2019   BILITOT 0.8 09/30/2019     Assessment & Plan:   Problem List Items Addressed This Visit      High   Septic arthritis of sternoclavicular joint, left ([place])    Healing nicely following surgery. He has follow up with TCTS again in a few weeks. No need for ongoing antibiotics at this time.       RESOLVED: MSSA bacteremia - Primary   Discitis of lumbar region    He seems to have improved greatly since our last visit given his decreased need for pain  medications, completion of PT/OT and now resuming 90% of normal activities at home. He does still have some pain and stiffness which I am not surprised. The nature of this infection can cause damage to the bone/joint and take some time to recover from. May not have 100% pain free expectation or return to pre-infectious normal.  No antibiotics needed today - he was treated with 10 weeks total with good result clinically and with lab work. Will repeat CRP/ESR today for reassurance.  He will follow up with virtual visit again in 2 months to check in one final time.       Relevant Orders   C-reactive protein (Completed)   Sed Rate (ESR) (Completed)     Unprioritized   RESOLVED: PICC (peripherally inserted central catheter) in place    Removed at completion of therapy. No complications.          [name], MSN, NP-C [facility] Pager: [phone] Office: [phone]  11/19/19  2:03 PM

## 2019-11-19 ENCOUNTER — Encounter: Payer: Self-pay | Admitting: Infectious Diseases

## 2019-11-19 LAB — SEDIMENTATION RATE: Sed Rate: 2 mm/h (ref 0–20)

## 2019-11-19 LAB — C-REACTIVE PROTEIN: CRP: 0.2 mg/L (ref <8.0)

## 2019-11-19 NOTE — Assessment & Plan Note (Signed)
He seems to have improved greatly since our last visit given his decreased need for pain medications, completion of PT/OT and now resuming 90% of normal activities at home. He does still have some pain and stiffness which I am not surprised. The nature of this infection can cause damage to the bone/joint and take some time to recover from. May not have 100% pain free expectation or return to pre-infectious normal.  No antibiotics needed today - he was treated with 10 weeks total with good result clinically and with lab work. Will repeat CRP/ESR today for reassurance.  He will follow up with virtual visit again in 2 months to check in one final time.

## 2019-11-19 NOTE — Assessment & Plan Note (Signed)
Removed at completion of therapy. No complications.

## 2019-11-19 NOTE — Assessment & Plan Note (Signed)
Healing nicely following surgery. He has follow up with [facility] again in a few weeks. No need for ongoing antibiotics at this time.

## 2019-11-19 NOTE — Progress Notes (Signed)
Markers flat. 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

## 2019-11-22 ENCOUNTER — Other Ambulatory Visit: Payer: Self-pay | Admitting: Internal Medicine

## 2019-11-24 DIAGNOSIS — I69398 Other sequelae of cerebral infarction: Secondary | ICD-10-CM

## 2019-11-24 DIAGNOSIS — F410 Panic disorder [episodic paroxysmal anxiety] without agoraphobia: Secondary | ICD-10-CM

## 2019-11-24 DIAGNOSIS — Z7901 Long term (current) use of anticoagulants: Secondary | ICD-10-CM

## 2019-11-24 DIAGNOSIS — E1165 Type 2 diabetes mellitus with hyperglycemia: Secondary | ICD-10-CM

## 2019-11-24 DIAGNOSIS — R7881 Bacteremia: Secondary | ICD-10-CM

## 2019-11-24 DIAGNOSIS — M6281 Muscle weakness (generalized): Secondary | ICD-10-CM

## 2019-11-24 DIAGNOSIS — B9561 Methicillin susceptible Staphylococcus aureus infection as the cause of diseases classified elsewhere: Secondary | ICD-10-CM

## 2019-11-24 DIAGNOSIS — I480 Paroxysmal atrial fibrillation: Secondary | ICD-10-CM

## 2019-11-24 DIAGNOSIS — M00012 Staphylococcal arthritis, left shoulder: Secondary | ICD-10-CM

## 2019-11-24 DIAGNOSIS — M4646 Discitis, unspecified, lumbar region: Secondary | ICD-10-CM

## 2019-11-24 DIAGNOSIS — Z452 Encounter for adjustment and management of vascular access device: Secondary | ICD-10-CM

## 2019-11-24 DIAGNOSIS — Z87891 Personal history of nicotine dependence: Secondary | ICD-10-CM

## 2019-11-24 DIAGNOSIS — Z5181 Encounter for therapeutic drug level monitoring: Secondary | ICD-10-CM

## 2019-11-24 DIAGNOSIS — J439 Emphysema, unspecified: Secondary | ICD-10-CM

## 2019-11-24 DIAGNOSIS — Z7984 Long term (current) use of oral hypoglycemic drugs: Secondary | ICD-10-CM

## 2019-11-24 DIAGNOSIS — I2510 Atherosclerotic heart disease of native coronary artery without angina pectoris: Secondary | ICD-10-CM

## 2019-11-24 DIAGNOSIS — Z792 Long term (current) use of antibiotics: Secondary | ICD-10-CM

## 2019-12-01 ENCOUNTER — Other Ambulatory Visit: Payer: Self-pay

## 2019-12-01 MED ORDER — ONETOUCH ULTRASOFT LANCETS MISC: 6 refills | Status: AC

## 2019-12-09 ENCOUNTER — Ambulatory Visit: Payer: Medicare Other | Admitting: Cardiothoracic Surgery

## 2019-12-09 ENCOUNTER — Encounter: Payer: Self-pay | Admitting: Cardiothoracic Surgery

## 2019-12-09 ENCOUNTER — Other Ambulatory Visit: Payer: Self-pay

## 2019-12-09 VITALS — BP 115/63 | HR 62 | Temp 97.5°F | Resp 16 | Ht 73.0 in | Wt 265.0 lb

## 2019-12-09 DIAGNOSIS — M009 Pyogenic arthritis, unspecified: Secondary | ICD-10-CM

## 2019-12-09 DIAGNOSIS — Z09 Encounter for follow-up examination after completed treatment for conditions other than malignant neoplasm: Secondary | ICD-10-CM

## 2019-12-09 NOTE — Progress Notes (Signed)
[address]       [place] [address]             [phone]      [name]. [facility] Record [id number] Date of Birth: [birth year]  Referring: Aline August, MD Primary Care: Burnis Medin, MD Primary Cardiologist: No primary care provider on file.   Chief Complaint:   POST OP FOLLOW UP OPERATIVE REPORT DATE OF PROCEDURE:  08/31/2019 PREOPERATIVE DIAGNOSIS:  Infected left sternoclavicular joint. POSTOPERATIVE DIAGNOSIS:  Infected left sternoclavicular joint. SURGICAL PROCEDURE:  Incision and drainage and debridement of left sternoclavicular joint with application of wound VAC.  History of Present Illness:     Patient returns to the office today after prolonged hospitalization and time in rehab when he presented with underlying sepsis infected sternoclavicular joint which was debrided on September 1.   Patient continues to improve, he is returning to near normal activities.  He says he goes out daily.  He denies any recurrent fever or chills.  He has now been off antibiotics for 2 weeks.   While hospitalized CT scan indicated that the patient had a mildly dilated ascending aorta  Past Medical History:  Diagnosis Date  . Atrial fibrillation ([facility]) 03/22/2009   a. s/p multiple DCCV; b. no coumadin due to low TE risk profile; c. Tikosyn Rx  . Coronary atherosclerosis of native coronary artery 11/2002   a. s/p stent to LAD 12/03; OM2 occluded at cath 12/03; d. myoview 5/10: no ischemia;  e. echo 7/11: EF 55%, BAE, mild RVE, PASP 41-45; Myoview was in March 2013. There was no ischemia or infarction, EF 51%   . Cutaneous abscess of back excluding buttocks 07/04/2014   Appears to stem from possibly a cyst very large area 6 cm contact surgeon office   . Diabetes mellitus without complication ([facility])   . Drusen body    see opth note  . ERECTILE DYSFUNCTION 03/22/2009  . GERD 03/22/2009  . HYPERGLYCEMIA 04/25/2010  . HYPERLIPIDEMIA 03/22/2009  .  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 LATERAL EPICONDYLITIS, LEFT 10/24/2009  . LIVER FUNCTION TESTS, ABNORMAL 04/25/2010  . Local reaction to immunization 05/05/2012   minor resolving  zostavax   . Myocardial infarction ([facility]) mi2003  . Numbness in left leg    foot related to back disease and surgery  . Obesity, unspecified 04/24/2009  . Perforated appendicitis with necrosis s/p open appendectomy 06/07/14 06/04/2014  . Renal cyst    Characterized by MRI as simple  . Ruptured suppurative appendicitis    2015   . SLEEP APNEA, OBSTRUCTIVE 03/22/2009   compliant with CPAP  . THROMBOCYTOPENIA 08/16/2010  . TOBACCO USE, QUIT 10/24/2009  . 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 Umbilical hernia      Social History   Tobacco Use  Smoking Status Former Smoker  . Packs/day: 2.00  . Years: 42.00  . Pack years: 84.00  . Types: Cigarettes  . Quit date: 12/30/2008  . Years since quitting: 10.9  Smokeless Tobacco Never Used  Tobacco Comment   started at age 51; 1-2 ppd; quit 2010    Social History   Substance and Sexual Activity  Alcohol Use No  . Alcohol/week: 0.0 standard drinks   Comment: rarely; maybe 1 beer a year     Allergies  Allergen Reactions  . Pseudoephedrine Other (See Comments)    Patient went into afib  .  Diltiazem Rash and Itching    Also 2019  . Novocain [Procaine] Hives    Dentist appointment in 1968; since then has tolerated lidocaine and provocaine with no hives or difficulty.  . Quinolones     Patient was warned about not using Cipro and similar antibiotics. Recent studies have raised concern that fluoroquinolone antibiotics could be associated with an increased risk of aortic aneurysm Fluoroquinolones have non-antimicrobial properties that might jeopardise the integrity of the extracellular matrix of the vascular wall In a  propensity score matched cohort study in [place], there was a 66% increased rate of aortic aneurysm or  dissection associated with oral fluoroquinolone use, compared wit  . Pseudoephedrine Hcl Palpitations  . Sulfonamide Derivatives Other (See Comments)    Childhood reaction     Current Outpatient Medications  Medication Sig Dispense Refill  . acetaminophen (TYLENOL) 325 MG tablet Take 650 mg by mouth every 6 (six) hours as needed for mild pain.    [name] albuterol (PROVENTIL HFA;VENTOLIN HFA) 108 (90 Base) MCG/ACT inhaler Inhale 2 puffs into the lungs every 6 (six) hours as needed for wheezing or shortness of breath. 1 Inhaler 0  . ALPHAGAN P 0.1 % SOLN Place 1 drop into both eyes 3 (three) times daily.   3  . atorvastatin (LIPITOR) 80 MG tablet TAKE 1 TABLET BY MOUTH  DAILY 90 tablet 3  . BREO ELLIPTA 200-25 MCG/INH AEPB INHALE 1 PUFF INTO THE LUNGS EVERY DAY 60 each 2  . diphenhydrAMINE (BENADRYL) 25 MG tablet Take 25 mg by mouth at bedtime as needed for sleep.    [name] dofetilide (TIKOSYN) 500 MCG capsule Take 1 capsule (500 mcg total) by mouth 2 (two) times daily. 180 capsule 2  . famotidine (PEPCID) 10 MG tablet Take 10 mg by mouth as needed for heartburn.     . fish oil-omega-3 fatty acids 1000 MG capsule Take 1 g by mouth 2 (two) times daily.     . fluticasone (FLONASE) 50 MCG/ACT nasal spray Place 2 sprays into both nostrils daily as needed for allergies. 48 g prn  . Lancets (ONETOUCH ULTRASOFT) lancets Twice daily 100 each 6  . magnesium oxide (MAG-OX) 400 (241.3 Mg) MG tablet Take 1 tablet (400 mg total) by mouth daily. (Patient taking differently: Take 400 mg by mouth 2 (two) times daily. ) 30 tablet 0  . metFORMIN (GLUCOPHAGE-XR) 500 MG 24 hr tablet Take 1 tablet (500 mg total) by mouth 3 (three) times daily.    . methocarbamol (ROBAXIN) 500 MG tablet Take 1 tablet (500 mg total) by mouth 4 (four) times daily as needed for muscle spasms. 120 tablet 2  . MULTIPLE VITAMIN PO Take 1 tablet by mouth every evening.     . nitroGLYCERIN (NITROSTAT) 0.4 MG SL tablet Place 1 tablet (0.4 mg total)  under the tongue every 5 (five) minutes as needed for chest pain. 25 tablet 12  . ondansetron (ZOFRAN ODT) 4 MG disintegrating tablet Take 1 tablet (4 mg total) by mouth every 8 (eight) hours as needed for nausea or vomiting. 24 tablet 0  . ONETOUCH VERIO test strip USE TWICE A DAY 50 each 12  . pantoprazole (PROTONIX) 40 MG tablet Take 40 mg by mouth daily.    [name] PARoxetine (PAXIL) 20 MG tablet TAKE 1 TABLET BY MOUTH EVERYDAY AT BEDTIME 30 tablet 5  . potassium chloride SA (KLOR-CON M20) 20 MEQ tablet Take 1 tablet (20 mEq total) by mouth 2 (two) times daily. 60 tablet 0  .  PRADAXA 150 MG CAPS capsule TAKE 1 CAPSULE BY MOUTH  EVERY 12 HOURS 180 capsule 3  . tamsulosin (FLOMAX) 0.4 MG CAPS capsule TAKE 1 CAPSULE BY MOUTH EVERY DAY (Patient taking differently: Take 0.4 mg by mouth daily as needed (repeat kidney stone). ) 30 capsule 5  . traZODone (DESYREL) 50 MG tablet Take 1 tablet (50 mg total) by mouth at bedtime. 30 tablet 5  . triamcinolone cream (KENALOG) 0.1 % Apply 1 application topically 2 (two) times daily. As needed. (Patient taking differently: Apply 1 application topically 2 (two) times daily as needed (rash). ) 30 g 0  . fexofenadine (ALLEGRA) 180 MG tablet Take 180 mg by mouth daily.     No current facility-administered medications for this visit.       Physical Exam: BP 115/63 (BP Location: Right Arm, Patient Position: Sitting, Cuff Size: Normal)   Pulse 62   Temp (!) 97.5 F (36.4 C)   Resp 16   Ht 6\' 1"  (1.854 m)   Wt 120.2 kg   SpO2 94% Comment: RA  BMI 34.96 kg/m  General appearance: alert and cooperative Head: Normocephalic, without obvious abnormality, atraumatic Neck: no adenopathy, no carotid bruit, no JVD, supple, symmetrical, trachea midline and thyroid not enlarged, symmetric, no tenderness/mass/nodules Lymph nodes: Cervical, supraclavicular, and axillary nodes normal. Resp: clear to auscultation bilaterally Back: symmetric, no curvature. ROM normal. No CVA  tenderness. GI: soft, non-tender; bowel sounds normal; no masses,  no organomegaly Extremities: extremities normal, atraumatic, no cyanosis or edema and Homans sign is negative, no sign of DVT Neurologic: Grossly normal The site at the left sternoclavicular joint is now almost completely completely healed   diagnostic Studies & Laboratory data:     Recent Radiology Findings:   No results found.    Recent Lab Findings: Lab Results  Component Value Date   WBC 8.8 09/30/2019   HGB 13.1 09/30/2019   HCT 38.3 (L) 09/30/2019   PLT 159 09/30/2019   GLUCOSE 142 (H) 09/30/2019   CHOL 85 02/08/2019   TRIG 110.0 02/08/2019   HDL 30.00 (L) 02/08/2019   LDLCALC 33 02/08/2019   ALT 25 09/30/2019   AST 38 09/30/2019   NA 137 09/30/2019   K 4.2 09/30/2019   CL 104 09/30/2019   CREATININE 0.86 09/30/2019   BUN 28 (H) 09/30/2019   CO2 25 09/30/2019   TSH 1.414 08/27/2019   INR 1.2 08/31/2019   HGBA1C 6.7 (H) 07/21/2019      Assessment / Plan:  #1 healed infected left sternoclavicular joint Today the patient was also seen in follow-up for his mildly dilated ascending aorta 4 cm incidentally noted on CT scan when he presented with overwhelming sepsis and sternoclavicular diverticulum pain in the hospital  2.  Dilated ascending-I reviewed with the patient the diagnosis of mildly dilated ascending aorta, need to maintain good blood pressure control, avoid Valsalva maneuvers and heavy lifting, avoid quinolone antibiotics-for follow-up we will plan CTA of the chest in 6 months   Medication Changes: No orders of the defined types were placed in this encounter.     Grace Isaac MD      [address] [place] [address] Office [phone]   Beeper [phone]  12/09/2019 1:39 PM

## 2019-12-09 NOTE — Patient Instructions (Signed)
Ascending Aortic Aneurysm/ Thoracic Aortic Aneurysm   Recent studies have raised concern that fluoroquinolone antibiotics could be associated with an increased risk of aortic aneurysm or aortic dissection. You should avoid use of Cipro and other associated antibiotics (flouroquinolone antibiotics )  It is  best to avoid activities that cause grunting or straining (medically referred to as a "valsalva maneuver"). This happens when a person bears down against a closed throat to increase the strength of arm or abdominal muscles. There's often a tendency to do this when lifting heavy weights, doing sit-ups, push-ups or chin-ups, etc., but it may be harmful.     An aneurysm is a bulge in an artery. It happens when blood pushes up against a weakened or damaged artery wall. A thoracic aortic aneurysm is an aneurysm that occurs in the first part of the aorta, between the heart and the diaphragm. The aorta is the main artery of the body. It supplies blood from the heart to the rest of the body. Some aneurysms may not cause symptoms or problems. However, the major concern with a thoracic aortic aneurysm is that it can enlarge and burst (rupture), or blood can flow between the layers of the wall of the aorta through a tear (aorticdissection). Both of these conditions can cause bleeding inside the body and can be life-threatening if they are not diagnosed and treated right away. What are the causes? The exact cause of this condition is not known. What increases the risk? The following factors may make you more likely to develop this condition:  Being age 65 or older.  Having a hardening of the arteries caused by the buildup of fat and other substances in the lining of a blood vessel (arteriosclerosis).  Having inflammation of the walls of an artery (arteritis).  Having a genetic disease that weakens the body's connective tissue, such as Marfan syndrome.  Having an injury or  trauma to the aorta.  Having an infection that is caused by bacteria, such as syphilis or staphylococcus, in the wall of the aorta (infectious aortitis).  Having high blood pressure (hypertension).  Being male.  Being white (Caucasian).  Having high cholesterol.  Having a family history of aneurysms.  Using tobacco.  Having chronic obstructive pulmonary disease (COPD). What are the signs or symptoms? Symptoms of this condition vary depending on the size and rate of growth of the aneurysm. Most grow slowly and do not cause any symptoms. When symptoms do occur, they may include:  Pain in the chest, back, sides, or abdomen. The pain may vary in intensity. A sudden onset of severe pain may indicate that the aneurysm has ruptured.  Hoarseness.  Cough.  Shortness of breath.  Swallowing problems.  Swelling in the face, arms, or legs.  Fever.  Unexplained weight loss. How is this diagnosed? This condition may be diagnosed with:  An ultrasound.  X-rays.  A CT scan.  An MRI.  Tests to check the arteries for damage or blockages (angiogram). Most unruptured thoracic aortic aneurysms cause no symptoms, so they are often found during exams for other conditions. How is this treated? Treatment for this condition depends on:  The size of the aneurysm.  How fast the aneurysm is growing.  Your age.  Risk factors for rupture. Aneurysms that are smaller than 2.2 inches (5.5 cm) may be managed by using medicines to control blood pressure, manage pain, or fight infection. You may need regular monitoring to see if the aneurysm is getting bigger. Your health care   provider may recommend that you have an ultrasound every year or every 6 months. How often you need to have an ultrasound depends on the size of the aneurysm, how fast it is growing, and whether you have a family history of aneurysms. Surgical repair may be needed if your aneurysm is larger than 2.2 inches or if it is  growing quickly. Follow these instructions at home: Eating and drinking   Eat a healthy diet. Your health care provider may recommend that you:  Lower your salt (sodium) intake. In some people, too much salt can raise blood pressure and increase the risk of thoracic aortic aneurysm.  Avoid foods that are high in saturated fat and cholesterol, such as red meat and dairy.  Eat a diet that is low in sugar.  Increase your fiber intake by including whole grains, vegetables, and fruits in your diet. Eating these foods may help to lower blood pressure.  Limit or avoid alcohol as recommended by your health care provider. Lifestyle   Follow instructions from your health care provider about healthy lifestyle habits. Your health care provider may recommend that you:  Do not use any products that contain nicotine or tobacco, such as cigarettes and e-cigarettes. If you need help quitting, ask your health care provider.  Keep your blood pressure within normal limits. The target limit for most people is below 120/80. Check your blood pressure regularly. If it is high, ask your health care provider about ways that you can control it.  Keep your blood sugar (glucose) level and cholesterol levels within normal limits. Target limits for most people are:  Blood glucose level: Less than 100 mg/dL.  Total cholesterol level: Less than 200 mg/dL.  Maintain a healthy weight. Activity   Stay physically active and exercise regularly. Talk with your health care provider about how often you should exercise and ask which types of exercise are safe for you.  Avoid heavy lifting and activities that take a lot of effort (are strenuous). Ask your health care provider what activities are safe for you. General instructions   Keep all follow-up visits as told by your health care provider. This is important.  Talk with your health care provider about regular screenings to see if the aneurysm is getting bigger.   Take over-the-counter and prescription medicines only as told by your health care provider. Contact a health care provider if:  You have discomfort in your upper back, neck, or abdomen.  You have trouble swallowing.  You have a cough or hoarseness.  You have a family history of aneurysms.  You have unexplained weight loss. Get help right away if:  You have sudden, severe pain in your upper back and abdomen. This pain may move into your chest and arms.  You have shortness of breath.  You have a fever. This information is not intended to replace advice given to you by your health care provider. Make sure you discuss any questions you have with your health care provider. Document Released: 12/16/2005 Document Revised: 09/27/2016 Document Reviewed: 09/27/2016 Elsevier Interactive Patient Education  2017 [employer]   Aortic Dissection An aortic dissection happens when there is a tear in the main blood vessel of the body (aorta). The aorta comes out of the heart, curves around, and then goes down the chest (thoracic aorta) and into the abdomen (abdominal aorta) to supply arteries with blood. The wall of the aorta has inner and outer layers. Aortic dissection occurs most often in the thoracic aorta. As   the tear widens and blood flows through it, the aorta becomes "double-barreled." This means that one part of the aorta continues to carry blood to the body, but blood also flows into the tear, between the layers of the aorta. The torn part of the aorta fills with blood and swells up. This can reduce blood flow through the part of the aorta that is still supplying blood to the body. Aortic dissection is a medical emergency. What are the causes? An aortic dissection is commonly caused by weakening of the artery wall due to high blood pressure. Other causes may include:  An injury, such as from a car crash.  Birth defects that affect the heart (congenital heart defects).  Thickening of the  artery walls. In some cases, the cause is not known. What increases the risk? The following factors may make you more likely to develop this condition:  Having certain medical conditions, such as:  High blood pressure (hypertension).  Hardening and narrowing of the arteries (atherosclerosis).  A genetic disorder that affects the connective tissue, such as Marfan syndrome or Ehlers-Danlos syndrome.  A condition that causes inflammation of blood vessels, such as giant cell arteritis.  Having a chest injury.  Having surgery on the aorta.  Being born with a congenital heart defect.  Being male.  Being older than age 60.  Using cocaine.  Smoking.  Lifting heavy weights or doing other types of high-intensity resistance training. What are the signs or symptoms? Signs and symptoms of aortic dissection start suddenly. The most common symptoms are:  Severe chest pain that may feel like tearing, stabbing, or sharp pain.  Severe pain that spreads (radiates) to the back, neck, jaw, or abdomen. Other symptoms may include:  Trouble breathing.  Dizziness or fainting.  Sudden weakness on one side of the body.  Nausea or vomiting.  Trouble swallowing.  Coughing up blood.  Vomiting blood.  Clammy skin. How is this diagnosed? This condition may be diagnosed based on:  Your symptoms.  A physical exam. This may include:  Listening for abnormal blood flow sounds (murmurs) in your chest or abdomen.  Checking your pulse in your arms and legs.  Checking your blood pressure to see whether it is low or whether there is a difference between the measurements in your right and left arm.  Electrocardiogram (ECG). This test measures the electrical activity in your heart.  Chest X-ray.  CT scan.  MRI.  Aortic angiogram. This test involves injecting dye to make it easier to see your blood vessels clearly.  Echocardiogram to study your heart using sound waves.  Blood tests.  How is this treated? It is important to treat an aortic dissection as quickly as possible. Treatment may start as soon as your health care provider thinks that you have aortic dissection. Treatment depends on the location and severity of your dissection and your overall health. Treatment may include:  Medicines to lower your blood pressure.  Surgery to repair the dissected part of your aorta with artificial material (syntheticgraft).  A medical procedure to insert a stent-graft into the aorta (endovascular procedure). During this procedure, a long, thin tube (stent) is inserted into an artery near the groin (femoral artery) and moved up to the damaged part of the aorta. Then, the stent is opened to help improve blood flow and prevent future dissection. Follow these instructions at home: Activity   Avoid activities that could injure your chest or your abdomen. Ask your health care provider what activities are   safe for you.  After you have recovered, try to stay active. Ask your health care provider what activities are safe for you after recovery.  Do not lift anything that is heavier than 10 lb (4.5 kg) until your health care provider approves.  Do not drive or use heavy machinery while taking prescription pain medicine. Eating and drinking   Eat a heart-healthy diet, which includes lots of fresh fruits and vegetables, low-fat (lean) protein, and whole grains.  Check ingredients and nutrition facts on packaged foods and beverages, and avoid foods with high amounts of:  Salt (sodium).  Saturated fats (like red meat).  Trans fats (like fried food). General instructions   Take over-the-counter and prescription medicines only as told by your health care provider.  Work with your health care provider to manage your blood pressure.  Talk with your health care provider about how to manage stress.  Do not use any products that contain nicotine or tobacco, such as cigarettes and  e-cigarettes. If you need help quitting, ask your health care provider.  Keep all follow-up visits as told by your health care provider. This is important. Get help right away if:  You develop any symptoms of aortic dissection after treatment, including severe pain in your chest, back, or abdomen.  You have a pain in your abdomen.  You have trouble breathing or you develop a cough.  You faint.  You develop a racing heartbeat. These symptoms may represent a serious problem that is an emergency. Do not wait to see if the symptoms will go away. Get medical help right away. Call your local emergency services (911 in the [place] Do not drive yourself to the hospital. Summary  An aortic dissection happens when there is a tear in the main blood vessel of the body (aorta). It is a medical emergency.  The most common symptom is severe pain in the chest that spreads (radiates) to the back, neck, jaw, or abdomen.  It is important to treat an aortic dissection as quickly as possible. Treatment typically includes surgery and medicines. This information is not intended to replace advice given to you by your health care provider. Make sure you discuss any questions you have with your health care provider. Document Released: 03/24/2008 Document Revised: 11/04/2016 Document Reviewed: 11/04/2016 Elsevier Interactive Patient Education  2017 [employer]    

## 2019-12-13 ENCOUNTER — Other Ambulatory Visit: Payer: Self-pay

## 2019-12-13 ENCOUNTER — Encounter: Payer: Self-pay | Admitting: Physical Medicine and Rehabilitation

## 2019-12-13 ENCOUNTER — Encounter
Payer: Medicare Other | Attending: Physical Medicine and Rehabilitation | Admitting: Physical Medicine and Rehabilitation

## 2019-12-13 VITALS — BP 139/58 | HR 67 | Temp 97.5°F | Ht 73.0 in | Wt 273.0 lb

## 2019-12-13 DIAGNOSIS — M009 Pyogenic arthritis, unspecified: Secondary | ICD-10-CM | POA: Diagnosis not present

## 2019-12-13 DIAGNOSIS — B9561 Methicillin susceptible Staphylococcus aureus infection as the cause of diseases classified elsewhere: Secondary | ICD-10-CM | POA: Diagnosis present

## 2019-12-13 DIAGNOSIS — R7881 Bacteremia: Secondary | ICD-10-CM | POA: Insufficient documentation

## 2019-12-13 DIAGNOSIS — M4645 Discitis, unspecified, thoracolumbar region: Secondary | ICD-10-CM

## 2019-12-13 DIAGNOSIS — G8918 Other acute postprocedural pain: Secondary | ICD-10-CM | POA: Diagnosis not present

## 2019-12-13 DIAGNOSIS — G959 Disease of spinal cord, unspecified: Secondary | ICD-10-CM | POA: Diagnosis not present

## 2019-12-13 DIAGNOSIS — M5441 Lumbago with sciatica, right side: Secondary | ICD-10-CM | POA: Diagnosis present

## 2019-12-13 DIAGNOSIS — G479 Sleep disorder, unspecified: Secondary | ICD-10-CM

## 2019-12-13 NOTE — Progress Notes (Signed)
Subjective:    Patient ID: [name] [name]., male    DOB: [birth year], 68 y.o.   MRN: [id number]  HPI Patient is a 68 yr old male with A fib, sepsis of [place] joint, DM, osteomyelitis, diskitis, and post op pain here for f/u.  Trazodone is working "OK"- has to sleep 4 hours/night on CPAP to sleep.  Gets 7-8 hrs/night, but other time in recliner.   Off all pain pills Takes muscle relaxant in AM-  Back tightens up when in bed, so toughest time in AM- no pain currently.  Still problems bending over and getting something off floor. Thinks still a little weak- not like it was.  Off RW x 1 month.  Through PT and OT.  Slacked off on HEP- admits probably needs to restart.  Used to walk with 2 buddies 2 miles/day.   Had 2 appointments with PCP-  PCP said can take over trazodone/muscle relaxant.  Dr. Shanon Ace    Pain Inventory Average Pain 1 Pain Right Now 1 My pain is dull  In the last 24 hours, has pain interfered with the following? General activity 1 Relation with others 0 Enjoyment of life 0 What TIME of day is your pain at its worst? morning Sleep (in general) Fair  Pain is worse with: bending Pain improves with: medication Relief from Meds: 9  Mobility walk without assistance how many minutes can you walk? 20-30 ability to climb steps?  yes do you drive?  yes  Function not employed: date last employed .  Neuro/Psych weakness depression  Prior Studies Any changes since last visit?  no  Physicians involved in your care Any changes since last visit?  no   Family History  Problem Relation Age of Onset  . Thyroid disease Mother   . Ovarian cancer Mother   . Breast cancer Mother   . Lung cancer Father   . Cancer Father    Social History   Socioeconomic History  . Marital status: Married    Spouse name: Not on file  . Number of children: Not on file  . Years of education: Not on file  . Highest education level: Not on file  Occupational  History  . Not on file  Tobacco Use  . Smoking status: Former Smoker    Packs/day: 2.00    Years: 42.00    Pack years: 84.00    Types: Cigarettes    Quit date: 12/30/2008    Years since quitting: 10.9  . Smokeless tobacco: Never Used  . Tobacco comment: started at age 1; 1-2 ppd; quit 2010  Substance and Sexual Activity  . Alcohol use: No    Alcohol/week: 0.0 standard drinks    Comment: rarely; maybe 1 beer a year  . Drug use: No  . Sexual activity: Yes  Other Topics Concern  . Not on file  Social History Narrative   Retired paramedic   Regular exercise-yes not recently    Has children   Wife is overweight and has fibromyalgia and depression on disability doesn't go out much. Back surgery    Has older dog   Retired from [employer] 31 years of service now 7 years    Caffeine- minimal   Social Determinants of Health   Financial Resource Strain:   . Difficulty of Paying Living Expenses: Not on file  Food Insecurity:   . Worried About Charity fundraiser in the Last Year: Not on file  . Ran Out of Food  in the Last Year: Not on file  Transportation Needs:   . Lack of Transportation (Medical): Not on file  . Lack of Transportation (Non-Medical): Not on file  Physical Activity:   . Days of Exercise per Week: Not on file  . Minutes of Exercise per Session: Not on file  Stress:   . Feeling of Stress : Not on file  Social Connections:   . Frequency of Communication with Friends and Family: Not on file  . Frequency of Social Gatherings with Friends and Family: Not on file  . Attends Religious Services: Not on file  . Active Member of Clubs or Organizations: Not on file  . Attends Archivist Meetings: Not on file  . Marital Status: Not on file   Past Surgical History:  Procedure Laterality Date  . APPENDECTOMY N/A 06/06/2014   Procedure: APPENDECTOMY;  Surgeon: Odis Hollingshead, MD;  Location: [facility];  Service: General;  Laterality: N/A;  . COLON SURGERY    .  COLONOSCOPY  11/15/2011   Procedure: COLONOSCOPY;  Surgeon: Juanita Craver, MD;  [facility]: [facility];  Service: Endoscopy;  Laterality: N/A;  . COLONOSCOPY WITH PROPOFOL N/A 01/03/2017   Procedure: COLONOSCOPY WITH PROPOFOL;  Surgeon: Carol Ada, MD;  [facility]: [facility];  Service: Endoscopy;  Laterality: N/A;  . cyst on epiglottis  08/2002  . ESOPHAGOGASTRODUODENOSCOPY  11/15/2011   Procedure: ESOPHAGOGASTRODUODENOSCOPY (EGD);  Surgeon: Juanita Craver, MD;  [facility];  Service: Endoscopy;  Laterality: N/A;  . LAPAROSCOPIC APPENDECTOMY N/A 06/06/2014   Procedure: APPENDECTOMY LAPAROSCOPIC attemted;  Surgeon: Odis Hollingshead, MD;  [facility]: [facility];  Service: General;  Laterality: N/A;  . LUMBAR DISC SURGERY     two  holes in spinalcovering  . stent     LAD DUS 2004  . STERNAL WOUND DEBRIDEMENT Left 08/31/2019   Procedure: LEFT STERNOCLAVICULAR WOUND DEBRIDEMENT WITH APPLICATION OF WOUND VAC;  Surgeon: Grace Isaac, MD;  Location: [facility];  Service: Thoracic;  Laterality: Left;  . surgery l4-l5   1998   ruptured x 3  . ulnar neuropathy    . UMBILICAL HERNIA REPAIR     mesh   Past Medical History:  Diagnosis Date  . Atrial fibrillation ([place]) 03/22/2009   a. s/p multiple DCCV; b. no coumadin due to low TE risk profile; c. Tikosyn Rx  . Coronary atherosclerosis of native coronary artery 11/2002   a. s/p stent to LAD 12/03; OM2 occluded at cath 12/03; d. myoview 5/10: no ischemia;  e. echo 7/11: EF 55%, BAE, mild RVE, PASP 41-45; Myoview was in March 2013. There was no ischemia or infarction, EF 51%   . Cutaneous abscess of back excluding buttocks 07/04/2014   Appears to stem from possibly a cyst very large area 6 cm contact surgeon office   . Diabetes mellitus without complication ([place])   . Drusen body    see opth note  . ERECTILE DYSFUNCTION 03/22/2009  . GERD 03/22/2009  . HYPERGLYCEMIA 04/25/2010  . HYPERLIPIDEMIA 03/22/2009  . Iliac aneurysm ([facility])    2.6 to be evaluated  incidental finding on CT  . LATERAL EPICONDYLITIS, LEFT 10/24/2009  . LIVER FUNCTION TESTS, ABNORMAL 04/25/2010  . Local reaction to immunization 05/05/2012   minor resolving  zostavax   . Myocardial infarction ([place]) mi2003  . Numbness in left leg    foot related to back disease and surgery  . Obesity, unspecified 04/24/2009  . Perforated appendicitis with necrosis s/p open appendectomy 06/07/14 06/04/2014  .  Renal cyst    Characterized by MRI as simple  . Ruptured suppurative appendicitis    2015   . SLEEP APNEA, OBSTRUCTIVE 03/22/2009   compliant with CPAP  . THROMBOCYTOPENIA 08/16/2010  . TOBACCO USE, QUIT 10/24/2009  . 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 Umbilical hernia    BP (!) 139/58   Pulse 67   Temp (!) 97.5 F (36.4 C)   Ht 6\' 1"  (1.854 m)   Wt 273 lb (123.8 kg)   SpO2 91%   BMI 36.02 kg/m   Opioid Risk Score:   Fall Risk Score:  `1  Depression screen PHQ 2/9  Depression screen [facility] 2/9 11/18/2019 10/12/2019 07/21/2019 08/11/2018 08/08/2017 06/27/2017 06/25/2016  Decreased Interest 0 0 0 0 0 0 0  Down, Depressed, Hopeless 0 0 0 0 0 0 0  PHQ - 2 Score 0 0 0 0 0 0 0  Some recent data might be hidden    Review of Systems  Constitutional: Negative.   HENT: Negative.   Eyes: Negative.   Respiratory: Positive for apnea.   Cardiovascular: Negative.   Gastrointestinal: Negative.   Endocrine: Negative.        High blood sugar  Genitourinary: Negative.   Musculoskeletal: Negative.   Skin: Negative.   Allergic/Immunologic: Negative.   Neurological: Positive for weakness.  Hematological: Bruises/bleeds easily.  Psychiatric/Behavioral: Positive for dysphoric mood.  All other systems reviewed and are negative.      Objective:   Physical Exam  Awake, alert, appropriate, scar where wound VAC was on chest on L, NAD RRR CTA B/L Soft, NT, ND, (+)BS      Assessment & Plan:  Patient is a 68 yr old male with A fib, sepsis of  joint, DM, osteomyelitis, diskitis, and  post op pain here for f/u.  1. Decrease to 10 mg nightly- then should be able to stop it! Can take then every other day for 2 weeks then definitely should be able to come off it.   2. PCP can continue Trazodone and Methocarbamol Per pt request.  3. D/c however if needs to see me for anything prn, happy to.

## 2019-12-13 NOTE — Patient Instructions (Signed)
  Patient is a 68 yr old male with A fib, sepsis of [place] joint, DM, osteomyelitis, diskitis, and post op pain here for f/u.  1. Decrease to 10 mg nightly- then should be able to stop it! Can take then every other day for 2 weeks then definitely should be able to come off it.   2. PCP can continue Trazodone and Methocarbamol Per pt request.  3. D/c however if needs to see me for anything prn, happy to.

## 2019-12-20 ENCOUNTER — Other Ambulatory Visit: Payer: Self-pay

## 2019-12-20 ENCOUNTER — Encounter: Payer: Self-pay | Admitting: Family Medicine

## 2019-12-20 ENCOUNTER — Ambulatory Visit (INDEPENDENT_AMBULATORY_CARE_PROVIDER_SITE_OTHER): Payer: Medicare Other | Admitting: Family Medicine

## 2019-12-20 VITALS — BP 140/58 | HR 64 | Temp 98.1°F | Ht 73.0 in | Wt 270.5 lb

## 2019-12-20 DIAGNOSIS — L03115 Cellulitis of right lower limb: Secondary | ICD-10-CM | POA: Diagnosis not present

## 2019-12-20 MED ORDER — CEPHALEXIN 500 MG PO CAPS: 500.0000 mg | ORAL_CAPSULE | Freq: Four times a day (QID) | ORAL | 0 refills | Status: DC

## 2019-12-20 NOTE — Patient Instructions (Signed)

## 2019-12-20 NOTE — Progress Notes (Signed)
Subjective:     Patient ID: [name] [name]., male   DOB: [birth year], 68 y.o.   MRN: [id number]  HPI   [name] [name] is seen with concern for recurrent cellulitis right leg.  He states last Friday night he noticed some warmth and redness on the medial leg.  No fevers or chills.  He has past history of complicated MSSA bacteremia back in August.  He had some leftover Keflex and started 500 mg 3 times daily on Saturday.  He thinks there is less redness and feeling less hot since then.  Minimal pain.  He is allergic to quinolones and sulfa.  He has multiple chronic problems including obesity, history of CAD, atrial fibrillation, type 2 diabetes, obstructive sleep apnea  He had lengthy admission in August for complications including septic arthritis sternoclavicular joint on the left and discitis   Past Medical History:  Diagnosis Date  . Atrial fibrillation ([place]) 03/22/2009   a. s/p multiple DCCV; b. no coumadin due to low TE risk profile; c. Tikosyn Rx  . Coronary atherosclerosis of native coronary artery 11/2002   a. s/p stent to LAD 12/03; OM2 occluded at cath 12/03; d. myoview 5/10: no ischemia;  e. echo 7/11: EF 55%, BAE, mild RVE, PASP 41-45; Myoview was in March 2013. There was no ischemia or infarction, EF 51%   . Cutaneous abscess of back excluding buttocks 07/04/2014   Appears to stem from possibly a cyst very large area 6 cm contact surgeon office   . Diabetes mellitus without complication ([place])   . Drusen body    see opth note  . ERECTILE DYSFUNCTION 03/22/2009  . GERD 03/22/2009  . HYPERGLYCEMIA 04/25/2010  . HYPERLIPIDEMIA 03/22/2009  . 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 LATERAL EPICONDYLITIS, LEFT 10/24/2009  . LIVER FUNCTION TESTS, ABNORMAL 04/25/2010  . Local reaction to immunization 05/05/2012   minor resolving  zostavax   . Myocardial infarction ([place]) mi2003  . Numbness in left leg    foot related to back disease and surgery  . Obesity,  unspecified 04/24/2009  . Perforated appendicitis with necrosis s/p open appendectomy 06/07/14 06/04/2014  . Renal cyst    Characterized by MRI as simple  . Ruptured suppurative appendicitis    2015   . SLEEP APNEA, OBSTRUCTIVE 03/22/2009   compliant with CPAP  . THROMBOCYTOPENIA 08/16/2010  . TOBACCO USE, QUIT 10/24/2009  . 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 Umbilical hernia    Past Surgical History:  Procedure Laterality Date  . APPENDECTOMY N/A 06/06/2014   Procedure: APPENDECTOMY;  Surgeon: [name] Hollingshead, MD;  [facility];  Service: General;  Laterality: N/A;  . COLON SURGERY    . COLONOSCOPY  11/15/2011   Procedure: COLONOSCOPY;  Surgeon: [name] Craver, MD;  [facility];  Service: Endoscopy;  Laterality: N/A;  . COLONOSCOPY WITH PROPOFOL N/A 01/03/2017   Procedure: COLONOSCOPY WITH PROPOFOL;  Surgeon: [name] Ada, MD;  [facility];  Service: Endoscopy;  Laterality: N/A;  . cyst on epiglottis  08/2002  . ESOPHAGOGASTRODUODENOSCOPY  11/15/2011   Procedure: ESOPHAGOGASTRODUODENOSCOPY (EGD);  Surgeon: [name] Craver, MD;  [facility];  Service: Endoscopy;  Laterality: N/A;  . LAPAROSCOPIC APPENDECTOMY N/A 06/06/2014   Procedure: APPENDECTOMY LAPAROSCOPIC attemted;  Surgeon: [name] Hollingshead, MD;  [facility];  Service: General;  Laterality: N/A;  . LUMBAR DISC SURGERY     two  holes in spinalcovering  . stent  LAD DUS 2004  . STERNAL WOUND DEBRIDEMENT Left 08/31/2019   Procedure: LEFT STERNOCLAVICULAR WOUND DEBRIDEMENT WITH APPLICATION OF WOUND VAC;  Surgeon: [name] Isaac, MD;  Location: [facility];  Service: Thoracic;  Laterality: Left;  . surgery l4-l5   1998   ruptured x 3  . ulnar neuropathy    . UMBILICAL HERNIA REPAIR     mesh    reports that he quit smoking about 10 years ago. His smoking use included cigarettes. He has a 84.00 pack-year smoking history. He has never used smokeless tobacco. He reports that he does not drink alcohol or  use drugs. family history includes Breast cancer in his mother; Cancer in his father; Lung cancer in his father; Ovarian cancer in his mother; Thyroid disease in his mother. Allergies  Allergen Reactions  . Pseudoephedrine Other (See Comments)    Patient went into afib  . Diltiazem Rash and Itching    Also 2019  . Novocain [Procaine] Hives    Dentist appointment in 1968; since then has tolerated lidocaine and provocaine with no hives or difficulty.  . Quinolones     Patient was warned about not using Cipro and similar antibiotics. Recent studies have raised concern that fluoroquinolone antibiotics could be associated with an increased risk of aortic aneurysm Fluoroquinolones have non-antimicrobial properties that might jeopardise the integrity of the extracellular matrix of the vascular wall In a  propensity score matched cohort study in [place], there was a 66% increased rate of aortic aneurysm or dissection associated with oral fluoroquinolone use, compared wit  . Pseudoephedrine Hcl Palpitations  . Sulfonamide Derivatives Other (See Comments)    Childhood reaction     Review of Systems  Constitutional: Negative for chills and fever.  Respiratory: Negative for cough and shortness of breath.   Cardiovascular: Negative for chest pain.  Gastrointestinal: Negative for nausea and vomiting.       Objective:   Physical Exam Vitals reviewed.  Constitutional:      Appearance: Normal appearance.  Cardiovascular:     Rate and Rhythm: Normal rate and regular rhythm.  Skin:    Comments: He has some mild warmth and mild erythema right medial lower leg.  This is over an area about 6 x 6 cm.  No wounds or any evidence for abrasion.  No fluctuance.  No pustules.  Neurological:     Mental Status: He is alert.        Assessment:     Probable early cellulitis right lower leg.  Past history of complicated MSSA bacteremia, discitis, and left sternoclavicular septic joint    Plan:      -Start back Keflex 500 mg 4 times daily for 10 days -Elevate leg frequently -Follow-up promptly for any fever, progressive erythema, or other concerns  [name] Post MD [facility]

## 2019-12-27 LAB — HM DIABETES EYE EXAM

## 2020-01-05 ENCOUNTER — Other Ambulatory Visit: Payer: Self-pay | Admitting: Internal Medicine

## 2020-01-06 ENCOUNTER — Encounter: Payer: Self-pay | Admitting: Internal Medicine

## 2020-01-13 ENCOUNTER — Encounter: Payer: Self-pay | Admitting: Infectious Diseases

## 2020-01-13 ENCOUNTER — Telehealth (INDEPENDENT_AMBULATORY_CARE_PROVIDER_SITE_OTHER): Payer: Medicare Other | Admitting: Infectious Diseases

## 2020-01-13 ENCOUNTER — Other Ambulatory Visit: Payer: Self-pay

## 2020-01-13 DIAGNOSIS — M25512 Pain in left shoulder: Secondary | ICD-10-CM | POA: Diagnosis not present

## 2020-01-13 DIAGNOSIS — L03119 Cellulitis of unspecified part of limb: Secondary | ICD-10-CM | POA: Diagnosis not present

## 2020-01-13 DIAGNOSIS — M009 Pyogenic arthritis, unspecified: Secondary | ICD-10-CM

## 2020-01-13 DIAGNOSIS — M4646 Discitis, unspecified, lumbar region: Secondary | ICD-10-CM

## 2020-01-13 MED ORDER — CEPHALEXIN 500 MG PO CAPS: 500.0000 mg | ORAL_CAPSULE | ORAL | 0 refills | Status: DC

## 2020-01-13 NOTE — Assessment & Plan Note (Signed)
He has improved following appropriate treatment with QID keflex. Tolerated well but overall concerned about CDiff if he does need to take medications longer term. We discussed using on demand approach to treat recurrent RLE cellulitis - I have asked him to please start taking a pill QID at onset of cellulitis prodrome and treat for 5 days; can stop as long as improved. Advised that he may occasionally need longer course if late intervention.   He will reach out to me via mychart with an update.   We also discussed compression socks again, which I reminded him was found in several studies to be extremely effective to combat recurrent cellulitis and to use as part of treatment of active cellulitis. He will pick some up and begin using daily.

## 2020-01-13 NOTE — Assessment & Plan Note (Signed)
He has done well off antibiotics for > 2 months. Followed up with nsgy and no interventions needed. Feels his pain at worst is 2/10. He is only taking medications prn now for comfort.  No indications for repeat MRI.  Likely this condition has cured/resolved as well.

## 2020-01-13 NOTE — Assessment & Plan Note (Signed)
He has an appointment with PCP soon. No alarming findings on virtual exam but he would benefit from inperson exam - he will also discuss outpatient PT.

## 2020-01-13 NOTE — Assessment & Plan Note (Signed)
Well healed incision and resolved problem. Unlikely this is contributing to the anterior shoulder pain he is experiencing L>R.

## 2020-01-13 NOTE — Progress Notes (Signed)
Patient: [name] [name].  DOB: 09-20-1951 MRN: [id number] PCP: Burnis Medin, MD    Patient Active Problem List   Diagnosis Date Noted  . Recurrent cellulitis of lower leg 01/13/2020  . Elevated BUN   . Postoperative pain   . Controlled type 2 diabetes mellitus with hyperglycemia, without long-term current use of insulin ([place])   . Right thalamic infarction ([place])   . Hypoalbuminemia due to protein-calorie malnutrition ([place])   . Diabetes mellitus type 2 in obese ([place])   . Left shoulder pain 08/30/2019  . Myelopathy ([place]) 08/27/2019  . H/O optic neuritis 08/27/2019  . Right hip pain 09/04/2017  . Thrombocytopenia ([place]) 06/25/2016  . Atrial fibrillation ([place]) 01/01/2016  . 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 Iliac aneurysm ([place])   . Morbid obesity ([place]) 07/08/2014  . Type 2 diabetes mellitus with other circulatory complications ([place]) 0000000  . Chronic anticoagulation 12/21/2013  . Sleep disorder 06/21/2013  . Decreased hearing 08/06/2012  . Hemorrhoids 08/06/2012  . High risk medication use 05/05/2012  . Anticoagulation management encounter 05/05/2012  . Retinal tear 04/29/2012  . Personal history of colonic polyps 05/05/2011  . Visit for preventive health examination 05/05/2011  . PALPITATIONS 08/16/2010  . LIVER FUNCTION TESTS, ABNORMAL 04/25/2010  . Lateral epicondylitis 10/24/2009  . TOBACCO USE, QUIT 10/24/2009  . OBESITY, UNSPECIFIED 04/24/2009  . Hyperlipidemia 03/22/2009  . ERECTILE DYSFUNCTION 03/22/2009  . Obstructive sleep apnea 03/22/2009  . 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 MYOCARDIAL INFARCTION, HX OF 03/22/2009  . Coronary atherosclerosis 03/22/2009  . Atrial fibrillation with RVR ([place]) 03/22/2009  . GERD 03/22/2009     Subjective:   CC:  Vertebral infection due to MSSA,  recurrent cellulitis.    HPI: [name] [name]. is a 69 y.o. male with pmhx diabetes, optic nerve neuritis. MSSA bacteremia in 07/2019 with L4-5  discitis/osteomyelitis, septic arthritis of L sternoclavicular joint that required surgery and ?contiguous meningitis. He had 2-3 previous episodes of cellulitis of the right leg one a very short while before escalating back pain.   [name] [name] has been doing well since our last office visit. He has had some increased discomfort in his shoulders L>R. Has trouble putting on deodorant at times. No injury that he recalls. Can raise arms over head and abduct/adduct without altered ROM. He has had no fevers/chills.   We talked via [place] in December re: another episode of cellulitis involving the right lower extremity. He was given rx for Keflex QID with good effect. He states he still has some residual pain, more swelling present esp at the end of the day, but no warmth/erythema. This is his 4th flare in the last 12 months.    Review of Systems  Constitutional: Negative for chills and fever.  HENT: Negative for tinnitus.   Eyes: Negative for blurred vision and photophobia.  Respiratory: Negative for cough and sputum production.   Cardiovascular: Positive for leg swelling. Negative for chest pain.  Gastrointestinal: Negative for diarrhea, nausea and vomiting.  Genitourinary: Negative for dysuria.  Musculoskeletal: Positive for joint pain.  Skin: Negative for rash.  Neurological: Negative for weakness and headaches.    Past Medical History:  Diagnosis Date  . Atrial fibrillation ([place]) 03/22/2009   a. s/p multiple DCCV; b. no coumadin due to low TE risk profile; c. Tikosyn Rx  . Coronary atherosclerosis of native coronary artery 11/2002   a. s/p stent to LAD 12/03; OM2 occluded at cath 12/03; d. [facility]  5/10: no ischemia;  e. echo 7/11: EF 55%, BAE, mild RVE, PASP 41-45; Myoview was in March 2013. There was no ischemia or infarction, EF 51%   . Cutaneous abscess of back excluding buttocks 07/04/2014   Appears to stem from possibly a cyst very large area 6 cm contact surgeon office   . Diabetes mellitus  without complication ([place])   . Drusen body    see opth note  . ERECTILE DYSFUNCTION 03/22/2009  . GERD 03/22/2009  . HYPERGLYCEMIA 04/25/2010  . HYPERLIPIDEMIA 03/22/2009  . 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 LATERAL EPICONDYLITIS, LEFT 10/24/2009  . LIVER FUNCTION TESTS, ABNORMAL 04/25/2010  . Local reaction to immunization 05/05/2012   minor resolving  zostavax   . Myocardial infarction ([place]) mi2003  . Numbness in left leg    foot related to back disease and surgery  . Obesity, unspecified 04/24/2009  . Perforated appendicitis with necrosis s/p open appendectomy 06/07/14 06/04/2014  . Renal cyst    Characterized by MRI as simple  . Ruptured suppurative appendicitis    2015   . SLEEP APNEA, OBSTRUCTIVE 03/22/2009   compliant with CPAP  . THROMBOCYTOPENIA 08/16/2010  . TOBACCO USE, QUIT 10/24/2009  . 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 Umbilical hernia     Outpatient Medications Prior to Visit  Medication Sig Dispense Refill  . albuterol (PROVENTIL HFA;VENTOLIN HFA) 108 (90 Base) MCG/ACT inhaler Inhale 2 puffs into the lungs every 6 (six) hours as needed for wheezing or shortness of breath. 1 Inhaler 0  . ALPHAGAN P 0.1 % SOLN Place 1 drop into both eyes 3 (three) times daily.   3  . atorvastatin (LIPITOR) 80 MG tablet TAKE 1 TABLET BY MOUTH  DAILY 90 tablet 3  . BREO ELLIPTA 200-25 MCG/INH AEPB INHALE 1 PUFF INTO THE LUNGS EVERY DAY 60 each 2  . dofetilide (TIKOSYN) 500 MCG capsule Take 1 capsule (500 mcg total) by mouth 2 (two) times daily. 180 capsule 2  . famotidine (PEPCID) 10 MG tablet Take 10 mg by mouth as needed for heartburn.     . fexofenadine (ALLEGRA) 180 MG tablet Take 180 mg by mouth daily.    . fish oil-omega-3 fatty acids 1000 MG capsule Take 1 g by mouth 2 (two) times daily.     . fluticasone (FLONASE) 50 MCG/ACT nasal spray Place 2 sprays into both nostrils daily as needed for allergies. 48 g prn  . Lancets (ONETOUCH ULTRASOFT)  lancets Twice daily 100 each 6  . metFORMIN (GLUCOPHAGE-XR) 500 MG 24 hr tablet Take 1 tablet (500 mg total) by mouth 3 (three) times daily.    . methocarbamol (ROBAXIN) 500 MG tablet Take 1 tablet (500 mg total) by mouth 4 (four) times daily as needed for muscle spasms. 120 tablet 2  . MULTIPLE VITAMIN PO Take 1 tablet by mouth every evening.     . nitroGLYCERIN (NITROSTAT) 0.4 MG SL tablet Place 1 tablet (0.4 mg total) under the tongue every 5 (five) minutes as needed for chest pain. 25 tablet 12  . ONETOUCH VERIO test strip USE AS DIRECTED TWICE A DAY 50 strip 12  . pantoprazole (PROTONIX) 40 MG tablet Take 40 mg by mouth daily.    . potassium chloride SA (KLOR-CON M20) 20 MEQ tablet Take 1 tablet (20 mEq total) by mouth 2 (two) times daily. 60 tablet 0  . PRADAXA 150 MG CAPS capsule TAKE 1 CAPSULE BY MOUTH  EVERY  12 HOURS 180 capsule 3  . tamsulosin (FLOMAX) 0.4 MG CAPS capsule TAKE 1 CAPSULE BY MOUTH EVERY DAY (Patient taking differently: Take 0.4 mg by mouth daily as needed (repeat kidney stone). ) 30 capsule 5  . acetaminophen (TYLENOL) 325 MG tablet Take 650 mg by mouth every 6 (six) hours as needed for mild pain.    . diphenhydrAMINE (BENADRYL) 25 MG tablet Take 25 mg by mouth at bedtime as needed for sleep.    . magnesium oxide (MAG-OX) 400 (241.3 Mg) MG tablet Take 1 tablet (400 mg total) by mouth daily. (Patient taking differently: Take 400 mg by mouth 2 (two) times daily. ) 30 tablet 0  . ondansetron (ZOFRAN ODT) 4 MG disintegrating tablet Take 1 tablet (4 mg total) by mouth every 8 (eight) hours as needed for nausea or vomiting. (Patient not taking: Reported on 01/13/2020) 24 tablet 0  . PARoxetine (PAXIL) 20 MG tablet TAKE 1 TABLET BY MOUTH EVERYDAY AT BEDTIME (Patient not taking: Reported on 01/13/2020) 30 tablet 5  . traZODone (DESYREL) 50 MG tablet Take 1 tablet (50 mg total) by mouth at bedtime. (Patient not taking: Reported on 01/13/2020) 30 tablet 5  . triamcinolone cream (KENALOG)  0.1 % Apply 1 application topically 2 (two) times daily. As needed. (Patient not taking: Reported on 01/13/2020) 30 g 0  . cephALEXin (KEFLEX) 500 MG capsule Take 1 capsule (500 mg total) by mouth 4 (four) times daily. (Patient not taking: Reported on 01/13/2020) 40 capsule 0   No facility-administered medications prior to visit.     Allergies  Allergen Reactions  . Pseudoephedrine Other (See Comments)    Patient went into afib  . Diltiazem Rash and Itching    Also 2019  . Novocain [Procaine] Hives    Dentist appointment in 1968; since then has tolerated lidocaine and provocaine with no hives or difficulty.  . Quinolones     Patient was warned about not using Cipro and similar antibiotics. Recent studies have raised concern that fluoroquinolone antibiotics could be associated with an increased risk of aortic aneurysm Fluoroquinolones have non-antimicrobial properties that might jeopardise the integrity of the extracellular matrix of the vascular wall In a  propensity score matched cohort study in [place], there was a 66% increased rate of aortic aneurysm or dissection associated with oral fluoroquinolone use, compared wit  . Pseudoephedrine Hcl Palpitations  . Sulfonamide Derivatives Other (See Comments)    Childhood reaction     Social History   Tobacco Use  . Smoking status: Former Smoker    Packs/day: 2.00    Years: 42.00    Pack years: 84.00    Types: Cigarettes    Quit date: 12/30/2008    Years since quitting: 11.0  . Smokeless tobacco: Never Used  . Tobacco comment: started at age 83; 1-2 ppd; quit 2010  Substance Use Topics  . Alcohol use: No    Alcohol/week: 0.0 standard drinks    Comment: rarely; maybe 1 beer a year  . Drug use: No     Objective:   There were no vitals filed for this visit. There is no height or weight on file to calculate BMI.  Physical Exam Vitals reviewed.  Constitutional:      Appearance: Normal appearance. He is not ill-appearing.      Comments: He is well appearing today. Pleasant.   HENT:     Head: Normocephalic.     Mouth/Throat:     Mouth: Mucous membranes are moist. No oral lesions.  Dentition: Normal dentition. No dental caries.     Pharynx: Oropharynx is clear.  Eyes:     General: No scleral icterus. Cardiovascular:     Rate and Rhythm: Normal rate and regular rhythm.     Heart sounds: Normal heart sounds.  Pulmonary:     Effort: Pulmonary effort is normal.     Breath sounds: Normal breath sounds.  Abdominal:     General: There is no distension.     Palpations: Abdomen is soft.     Tenderness: There is no abdominal tenderness.  Musculoskeletal:        General: Normal range of motion.     Cervical back: Normal range of motion.     Comments: He has normal abduction/adduction, seems to be negative can test, however maneuvers not done against resistance.  Left sternoclavicular incision well healed and without any signs of infection today.   Lymphadenopathy:     Cervical: No cervical adenopathy.  Skin:    General: Skin is warm and dry.     Capillary Refill: Capillary refill takes less than 2 seconds.     Coloration: Skin is not jaundiced or pale.     Findings: No rash.  Neurological:     Mental Status: He is alert and oriented to person, place, and time.  Psychiatric:        Mood and Affect: Mood normal.        Judgment: Judgment normal.     Lab Results: Lab Results  Component Value Date   WBC 8.8 09/30/2019   HGB 13.1 09/30/2019   HCT 38.3 (L) 09/30/2019   MCV 93.4 09/30/2019   PLT 159 09/30/2019    Lab Results  Component Value Date   CREATININE 0.86 09/30/2019   BUN 28 (H) 09/30/2019   NA 137 09/30/2019   K 4.2 09/30/2019   CL 104 09/30/2019   CO2 25 09/30/2019    Lab Results  Component Value Date   ALT 25 09/30/2019   AST 38 09/30/2019   ALKPHOS 89 09/30/2019   BILITOT 0.8 09/30/2019     Assessment & Plan:   Problem List Items Addressed This Visit      High   RESOLVED:  Septic arthritis of sternoclavicular joint, left ([place])    Well healed incision and resolved problem. Unlikely this is contributing to the anterior shoulder pain he is experiencing L>R.       Relevant Medications   cephALEXin (KEFLEX) 500 MG capsule   RESOLVED: Discitis of lumbar region    He has done well off antibiotics for > 2 months. Followed up with nsgy and no interventions needed. Feels his pain at worst is 2/10. He is only taking medications prn now for comfort.  No indications for repeat MRI.  Likely this condition has cured/resolved as well.         Unprioritized   Recurrent cellulitis of lower leg    He has improved following appropriate treatment with QID keflex. Tolerated well but overall concerned about CDiff if he does need to take medications longer term. We discussed using on demand approach to treat recurrent RLE cellulitis - I have asked him to please start taking a pill QID at onset of cellulitis prodrome and treat for 5 days; can stop as long as improved. Advised that he may occasionally need longer course if late intervention.   He will reach out to me via mychart with an update.   We also discussed compression socks again, which I reminded  him was found in several studies to be extremely effective to combat recurrent cellulitis and to use as part of treatment of active cellulitis. He will pick some up and begin using daily.       Left shoulder pain    He has an appointment with PCP soon. No alarming findings on virtual exam but he would benefit from inperson exam - he will also discuss outpatient PT.         He can follow up PRN, always happy to see [name] [name] back.   Follow Up Instructions: RTC PRN.  On Demand fills with keflex Measures to reduce cellulitis frequency discussed.  FU with PCP re: shoulder pain    I discussed the assessment and treatment plan with the patient. The patient was provided an opportunity to ask questions and all were answered. The patient  agreed with the plan and demonstrated an understanding of the instructions.   The patient was advised to call back or seek an in-person evaluation if the symptoms worsen or if the condition fails to improve as anticipated.  I provided 20 minutes of non-face-to-face time during this encounter.   [name], MSN, NP-C [facility] [email or web address] Pager: [phone] Office: [phone] [place]: [phone]

## 2020-01-20 ENCOUNTER — Other Ambulatory Visit: Payer: Self-pay

## 2020-01-21 ENCOUNTER — Ambulatory Visit (INDEPENDENT_AMBULATORY_CARE_PROVIDER_SITE_OTHER): Payer: Medicare Other | Admitting: Internal Medicine

## 2020-01-21 ENCOUNTER — Encounter: Payer: Self-pay | Admitting: Internal Medicine

## 2020-01-21 VITALS — BP 126/62 | HR 72 | Temp 98.0°F | Wt 283.6 lb

## 2020-01-21 DIAGNOSIS — M25511 Pain in right shoulder: Secondary | ICD-10-CM | POA: Diagnosis not present

## 2020-01-21 DIAGNOSIS — E1159 Type 2 diabetes mellitus with other circulatory complications: Secondary | ICD-10-CM | POA: Diagnosis not present

## 2020-01-21 DIAGNOSIS — Z79899 Other long term (current) drug therapy: Secondary | ICD-10-CM

## 2020-01-21 DIAGNOSIS — G479 Sleep disorder, unspecified: Secondary | ICD-10-CM

## 2020-01-21 DIAGNOSIS — M25512 Pain in left shoulder: Secondary | ICD-10-CM

## 2020-01-21 LAB — POCT GLYCOSYLATED HEMOGLOBIN (HGB A1C): Hemoglobin A1C: 6.4 % — AB (ref 4.0–5.6)

## 2020-01-21 MED ORDER — PAROXETINE HCL 20 MG PO TABS: 10.0000 mg | ORAL_TABLET | Freq: Every day | ORAL | 1 refills | Status: DC

## 2020-01-21 NOTE — Patient Instructions (Addendum)
Lets try to add back  paxil 10 mg per day  Consider  Increasing but make  Virtual visit in 3-4 weeks.   Maybe will help sleep.   consider getting pulse ox  .  Plan future labs when appropriate .   [name]    Making  referral to  Sports medicine  For reasons discussed  And rehab plan.  Get your covid vaccine   shingrix at pharmacy .  Agree with compression stockings   First thing on arising.

## 2020-01-21 NOTE — Progress Notes (Signed)
Chief Complaint  Patient presents with  . Follow-up    Pt is here for diabetes follow up and wants to discuss his hospital visits     HPI: [name] [name]. 69 y.o. come in for a number of issues.  He had a severe complex medical hospitalization recently and is recovered at home.  He now has bilateral right more than left anterior shoulder arm pain proximal.  Without a specific injury he feels somewhat weak after rehabilitation has some cracking and popping in joints.   Mobility ot pt.   Arms  And core muscles feeling lower week   May have strained tissue  Sleep  Worse   Less.  Than in past he used to be on [place] that was expensive he was on Paxil in the hospital with anxiety but recently   Weaned paxil   Off  A month .   Sighing episodes and feels like hard to catch breath through the mask when he puts the CPAP machine on but sleeps fine once he gets going not really shortness of breath or DOE he has a remote history of severe epiglottitis and remembers the anxiety of airway difficulties.  But has "gotten over that"   Had been given for sleep trazadone   Causes bad dreams  Not sure it helps.   Infectious disease is seeing him now on a as needed basis.  To use compression stockings the right leg is more swollen than the left has varicose veins can use Keflex as needed in the short-term.  Blood sugars have been controlled.  ROS: See pertinent positives and negatives per HPI.  Past Medical History:  Diagnosis Date  . Atrial fibrillation ([place]) 03/22/2009   a. s/p multiple DCCV; b. no coumadin due to low TE risk profile; c. Tikosyn Rx  . Coronary atherosclerosis of native coronary artery 11/2002   a. s/p stent to LAD 12/03; OM2 occluded at cath 12/03; d. myoview 5/10: no ischemia;  e. echo 7/11: EF 55%, BAE, mild RVE, PASP 41-45; Myoview was in March 2013. There was no ischemia or infarction, EF 51%   . Cutaneous abscess of back excluding buttocks 07/04/2014   Appears to stem  from possibly a cyst very large area 6 cm contact surgeon office   . Diabetes mellitus without complication ([place])   . Drusen body    see opth note  . ERECTILE DYSFUNCTION 03/22/2009  . GERD 03/22/2009  . HYPERGLYCEMIA 04/25/2010  . HYPERLIPIDEMIA 03/22/2009  . 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 LATERAL EPICONDYLITIS, LEFT 10/24/2009  . LIVER FUNCTION TESTS, ABNORMAL 04/25/2010  . Local reaction to immunization 05/05/2012   minor resolving  zostavax   . Myocardial infarction ([place]) mi2003  . Numbness in left leg    foot related to back disease and surgery  . Obesity, unspecified 04/24/2009  . Perforated appendicitis with necrosis s/p open appendectomy 06/07/14 06/04/2014  . Renal cyst    Characterized by MRI as simple  . Ruptured suppurative appendicitis    2015   . SLEEP APNEA, OBSTRUCTIVE 03/22/2009   compliant with CPAP  . THROMBOCYTOPENIA 08/16/2010  . TOBACCO USE, QUIT 10/24/2009  . 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 Umbilical hernia     Family History  Problem Relation Age of Onset  . Thyroid disease Mother   . Ovarian cancer Mother   . Breast cancer Mother   . Lung cancer Father   .  Cancer Father     Social History   Socioeconomic History  . Marital status: Married    Spouse name: Not on file  . Number of children: Not on file  . Years of education: Not on file  . Highest education level: Not on file  Occupational History  . Not on file  Tobacco Use  . Smoking status: Former Smoker    Packs/day: 2.00    Years: 42.00    Pack years: 84.00    Types: Cigarettes    Quit date: 12/30/2008    Years since quitting: 11.0  . Smokeless tobacco: Never Used  . Tobacco comment: started at age 46; 1-2 ppd; quit 2010  Substance and Sexual Activity  . Alcohol use: No    Alcohol/week: 0.0 standard drinks    Comment: rarely; maybe 1 beer a year  . Drug use: No  . Sexual activity: Yes  Other Topics Concern  . Not on file  Social History  Narrative   Retired paramedic   Regular exercise-yes not recently    Has children   Wife is overweight and has fibromyalgia and depression on disability doesn't go out much. Back surgery    Has older dog   Retired from [employer] 31 years of service now 7 years    Caffeine- minimal   Social Determinants of Health   Financial Resource Strain:   . Difficulty of Paying Living Expenses: Not on file  Food Insecurity:   . Worried About Charity fundraiser in the Last Year: Not on file  . Ran Out of Food in the Last Year: Not on file  Transportation Needs:   . Lack of Transportation (Medical): Not on file  . Lack of Transportation (Non-Medical): Not on file  Physical Activity:   . Days of Exercise per Week: Not on file  . Minutes of Exercise per Session: Not on file  Stress:   . Feeling of Stress : Not on file  Social Connections:   . Frequency of Communication with Friends and Family: Not on file  . Frequency of Social Gatherings with Friends and Family: Not on file  . Attends Religious Services: Not on file  . Active Member of Clubs or Organizations: Not on file  . Attends Archivist Meetings: Not on file  . Marital Status: Not on file    Outpatient Medications Prior to Visit  Medication Sig Dispense Refill  . acetaminophen (TYLENOL) 325 MG tablet Take 650 mg by mouth every 6 (six) hours as needed for mild pain.    [name] albuterol (PROVENTIL HFA;VENTOLIN HFA) 108 (90 Base) MCG/ACT inhaler Inhale 2 puffs into the lungs every 6 (six) hours as needed for wheezing or shortness of breath. 1 Inhaler 0  . ALPHAGAN P 0.1 % SOLN Place 1 drop into both eyes 3 (three) times daily.   3  . atorvastatin (LIPITOR) 80 MG tablet TAKE 1 TABLET BY MOUTH  DAILY 90 tablet 3  . BREO ELLIPTA 200-25 MCG/INH AEPB INHALE 1 PUFF INTO THE LUNGS EVERY DAY 60 each 2  . cephALEXin (KEFLEX) 500 MG capsule Take 1 capsule (500 mg total) by mouth See admin instructions. Start 1 pill four times a day x 5 - 7 at the  onset of cellulitis flare. 40 capsule 0  . diphenhydrAMINE (BENADRYL) 25 MG tablet Take 25 mg by mouth at bedtime as needed for sleep.    [name] dofetilide (TIKOSYN) 500 MCG capsule Take 1 capsule (500 mcg total) by mouth 2 (  two) times daily. 180 capsule 2  . famotidine (PEPCID) 10 MG tablet Take 10 mg by mouth as needed for heartburn.     . fexofenadine (ALLEGRA) 180 MG tablet Take 180 mg by mouth daily.    . fish oil-omega-3 fatty acids 1000 MG capsule Take 1 g by mouth 2 (two) times daily.     . fluticasone (FLONASE) 50 MCG/ACT nasal spray Place 2 sprays into both nostrils daily as needed for allergies. 48 g prn  . Lancets (ONETOUCH ULTRASOFT) lancets Twice daily 100 each 6  . magnesium oxide (MAG-OX) 400 (241.3 Mg) MG tablet Take 1 tablet (400 mg total) by mouth daily. (Patient taking differently: Take 400 mg by mouth 2 (two) times daily. ) 30 tablet 0  . metFORMIN (GLUCOPHAGE-XR) 500 MG 24 hr tablet Take 1 tablet (500 mg total) by mouth 3 (three) times daily.    . methocarbamol (ROBAXIN) 500 MG tablet Take 1 tablet (500 mg total) by mouth 4 (four) times daily as needed for muscle spasms. 120 tablet 2  . MULTIPLE VITAMIN PO Take 1 tablet by mouth every evening.     . nitroGLYCERIN (NITROSTAT) 0.4 MG SL tablet Place 1 tablet (0.4 mg total) under the tongue every 5 (five) minutes as needed for chest pain. 25 tablet 12  . ondansetron (ZOFRAN ODT) 4 MG disintegrating tablet Take 1 tablet (4 mg total) by mouth every 8 (eight) hours as needed for nausea or vomiting. 24 tablet 0  . ONETOUCH VERIO test strip USE AS DIRECTED TWICE A DAY 50 strip 12  . pantoprazole (PROTONIX) 40 MG tablet Take 40 mg by mouth daily.    [name] PARoxetine (PAXIL) 20 MG tablet TAKE 1 TABLET BY MOUTH EVERYDAY AT BEDTIME 30 tablet 5  . potassium chloride SA (KLOR-CON M20) 20 MEQ tablet Take 1 tablet (20 mEq total) by mouth 2 (two) times daily. 60 tablet 0  . PRADAXA 150 MG CAPS capsule TAKE 1 CAPSULE BY MOUTH  EVERY 12 HOURS 180 capsule  3  . tamsulosin (FLOMAX) 0.4 MG CAPS capsule TAKE 1 CAPSULE BY MOUTH EVERY DAY (Patient taking differently: Take 0.4 mg by mouth daily as needed (repeat kidney stone). ) 30 capsule 5  . traZODone (DESYREL) 50 MG tablet Take 1 tablet (50 mg total) by mouth at bedtime. 30 tablet 5  . triamcinolone cream (KENALOG) 0.1 % Apply 1 application topically 2 (two) times daily. As needed. 30 g 0   No facility-administered medications prior to visit.     EXAM:  BP 126/62 (BP Location: Right Arm, Patient Position: Sitting, Cuff Size: Normal)   Pulse 72   Temp 98 F (36.7 C) (Temporal)   Wt 283 lb 9.6 oz (128.6 kg)   SpO2 98%   BMI 37.42 kg/m   Body mass index is 37.42 kg/m. Wt Readings from Last 3 Encounters:  01/21/20 283 lb 9.6 oz (128.6 kg)  12/20/19 270 lb 8 oz (122.7 kg)  12/13/19 273 lb (123.8 kg)    GENERAL: vitals reviewed and listed above, alert, oriented, appears well hydrated and in no acute distress HEENT: atraumatic, conjunctiva  clear, no obvious abnormalities on inspection of external nose and ears OP : Masked NECK: no obvious masses on inspection palpation  LUNGS: clear to auscultation bilaterally, no wheezes, rales or rhonchi, good air movement CV: HRRR, no clubbing cyanosis has some varicosities lower extremities right leg is slightly bigger than left but no ulcers streaking.  Nl cap refill  MS: moves all extremities has  a crepitus in bilateral shoulder areas with some tenderness in the anterior proximal arm.  Range of motion is reasonably good though. PSYCH: pleasant and cooperative, he is ambulatory. Lab Results  Component Value Date   WBC 8.8 09/30/2019   HGB 13.1 09/30/2019   HCT 38.3 (L) 09/30/2019   PLT 159 09/30/2019   GLUCOSE 142 (H) 09/30/2019   CHOL 85 02/08/2019   TRIG 110.0 02/08/2019   HDL 30.00 (L) 02/08/2019   LDLCALC 33 02/08/2019   ALT 25 09/30/2019   AST 38 09/30/2019   NA 137 09/30/2019   K 4.2 09/30/2019   CL 104 09/30/2019   CREATININE 0.86  09/30/2019   BUN 28 (H) 09/30/2019   CO2 25 09/30/2019   TSH 1.414 08/27/2019   PSA 1.12 07/21/2019   INR 1.2 08/31/2019   HGBA1C 6.4 (A) 01/21/2020   MICROALBUR 18.7 (H) 02/08/2019   BP Readings from Last 3 Encounters:  01/21/20 126/62  12/20/19 (!) 140/58  12/13/19 (!) 139/58    ASSESSMENT AND PLAN:  Discussed the following assessment and plan:  Type 2 diabetes mellitus with other circulatory complications (HCC) - Plan: POCT glycosylated hemoglobin (Hb A1C)  Bilateral shoulder pain, unspecified chronicity - Plan: Ambulatory referral to Sports Medicine  Medication management  Sleep disorder Suspect that this sighing and perhaps the anxiety with masking for the CPAP which is new since his hospitalization could be a residual version of PTSD from his severe medical difficulties it has gotten worse since he has weaned off the Paxil.  Since he tolerated it we will go back on low-dose to see if it helps get through this times before the feelings extinguish. Restart  paxil 10 per day opt to inc to 20 per day .  The ROV in 20 mg  If needed .   It may help the sleep also?  Plan referral to Sport medicine in regard to the bilateral shoulder pain and help with rehabilitation musculoskeletal wise.  His diabetes is in good control. He has an appointment to get the Covid vaccine discussed the Shingrix vaccine to be gotten at pharmacy. Discussed use of compression socks stockings he has I believe the 15-20 He will observe for secondary infection as needed.  Plan rov in 3-4 weeks virtual about medication and sleep. Does not appear that the trazodone is helping him that much and may be having a side effect.  He used Belsomra in the past but it is very expensive $150 for 90 days. trazodone causes  Bad dreams   Currently  Will follow -Patient advised to return or notify health care team  if  new concerns arise.  Patient Instructions   Lets try to add back  paxil 10 mg per day  Consider   Increasing but make  Virtual visit in 3-4 weeks.   Maybe will help sleep.   consider getting pulse ox  .  Plan future labs when appropriate .   [name]    Making  referral to  Sports medicine  For reasons discussed  And rehab plan.  Get your covid vaccine   shingrix at pharmacy .  Agree with compression stockings   First thing on arising.      Standley Brooking. Amal Renbarger M.D.

## 2020-01-26 ENCOUNTER — Other Ambulatory Visit: Payer: Self-pay | Admitting: Physical Medicine and Rehabilitation

## 2020-01-29 ENCOUNTER — Ambulatory Visit: Payer: Medicare Other

## 2020-02-04 ENCOUNTER — Telehealth: Payer: Self-pay | Admitting: Pulmonary Disease

## 2020-02-05 ENCOUNTER — Ambulatory Visit: Payer: Medicare Other | Attending: Internal Medicine

## 2020-02-05 DIAGNOSIS — Z23 Encounter for immunization: Secondary | ICD-10-CM | POA: Insufficient documentation

## 2020-02-05 NOTE — Progress Notes (Signed)
   [id number]  Name:  Nasheem Perdew.    MRN: [id number] DOB: [birth year]  02/05/2020  Mr. Pata was observed post Covid-19 immunization for 15 minutes without incidence. He was provided with Vaccine Information Sheet and instruction to access the V-Safe system.   Mr. Ray was instructed to call 911 with any severe reactions post vaccine: [name] Difficulty breathing  . Swelling of your face and throat  . A fast heartbeat  . A bad rash all over your body  . Dizziness and weakness    Immunizations Administered    Name Date Dose VIS Date Route   Pfizer COVID-19 Vaccine 02/05/2020 11:11 AM 0.3 mL 12/10/2019 Intramuscular   Manufacturer: [place]   Lot: [id number]   [place]: [id number]

## 2020-02-07 ENCOUNTER — Other Ambulatory Visit: Payer: Self-pay | Admitting: Internal Medicine

## 2020-02-07 NOTE — Telephone Encounter (Addendum)
Working on this one. Pt scheduled at [facility] 2/22 @ 2, check in by 1:45, no prep.  Per Erline Levine, pt aware of appt.

## 2020-02-09 ENCOUNTER — Ambulatory Visit: Payer: Medicare Other

## 2020-02-11 ENCOUNTER — Telehealth (INDEPENDENT_AMBULATORY_CARE_PROVIDER_SITE_OTHER): Payer: Medicare Other | Admitting: Internal Medicine

## 2020-02-11 ENCOUNTER — Other Ambulatory Visit: Payer: Self-pay

## 2020-02-11 ENCOUNTER — Encounter: Payer: Self-pay | Admitting: Internal Medicine

## 2020-02-11 DIAGNOSIS — Z79899 Other long term (current) drug therapy: Secondary | ICD-10-CM

## 2020-02-11 DIAGNOSIS — G479 Sleep disorder, unspecified: Secondary | ICD-10-CM

## 2020-02-11 DIAGNOSIS — G4733 Obstructive sleep apnea (adult) (pediatric): Secondary | ICD-10-CM

## 2020-02-11 DIAGNOSIS — F4322 Adjustment disorder with anxiety: Secondary | ICD-10-CM | POA: Diagnosis not present

## 2020-02-11 NOTE — Progress Notes (Signed)
Virtual Visit via Video Note  I connected with@ on 02/11/20 at  3:00 PM EST by a video enabled telemedicine application and verified that I am speaking with the correct person using two identifiers. Location patient: home Location provider:work office Persons participating in the virtual visit: patient, provider  [place]th national recommendations  regarding COVID 19 pandemic   video visit is advised over in office visit for this patient.  Patient aware  of the limitations of evaluation and management by telemedicine and  availability of in person appointments. and agreed to proceed.   HPI: [name] [name]. presents for video visit  Fu restarting paxil 10 mg per day to see if helps with sleep disturbance and visit  ummpleasnt dreaming  THinks he is getting better and glad  Working  Has had 1 covid 19 first immunize  Without problem   Weight is stable  No other change in health   Spouse has had  resp sx and tested neg for covid 19 ROS: See pertinent positives and negatives per HPI.  Past Medical History:  Diagnosis Date  . Atrial fibrillation ([place]) 03/22/2009   a. s/p multiple DCCV; b. no coumadin due to low TE risk profile; c. Tikosyn Rx  . Coronary atherosclerosis of native coronary artery 11/2002   a. s/p stent to LAD 12/03; OM2 occluded at cath 12/03; d. myoview 5/10: no ischemia;  e. echo 7/11: EF 55%, BAE, mild RVE, PASP 41-45; Myoview was in March 2013. There was no ischemia or infarction, EF 51%   . Cutaneous abscess of back excluding buttocks 07/04/2014   Appears to stem from possibly a cyst very large area 6 cm contact surgeon office   . Diabetes mellitus without complication ([place])   . Drusen body    see opth note  . ERECTILE DYSFUNCTION 03/22/2009  . GERD 03/22/2009  . HYPERGLYCEMIA 04/25/2010  . HYPERLIPIDEMIA 03/22/2009  . 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 LATERAL EPICONDYLITIS, LEFT 10/24/2009  . LIVER FUNCTION TESTS, ABNORMAL 04/25/2010   . Local reaction to immunization 05/05/2012   minor resolving  zostavax   . Myocardial infarction ([place]) mi2003  . Numbness in left leg    foot related to back disease and surgery  . Obesity, unspecified 04/24/2009  . Perforated appendicitis with necrosis s/p open appendectomy 06/07/14 06/04/2014  . Renal cyst    Characterized by MRI as simple  . Ruptured suppurative appendicitis    2015   . SLEEP APNEA, OBSTRUCTIVE 03/22/2009   compliant with CPAP  . THROMBOCYTOPENIA 08/16/2010  . TOBACCO USE, QUIT 10/24/2009  . 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 Umbilical hernia     Past Surgical History:  Procedure Laterality Date  . APPENDECTOMY N/A 06/06/2014   Procedure: APPENDECTOMY;  Surgeon: Odis Hollingshead, MD;  Location: [facility];  Service: General;  Laterality: N/A;  . COLON SURGERY    . COLONOSCOPY  11/15/2011   Procedure: COLONOSCOPY;  Surgeon: Juanita Craver, MD;  Location: [facility];  Service: Endoscopy;  Laterality: N/A;  . COLONOSCOPY WITH PROPOFOL N/A 01/03/2017   Procedure: COLONOSCOPY WITH PROPOFOL;  Surgeon: Carol Ada, MD;  Location: [facility];  Service: Endoscopy;  Laterality: N/A;  . cyst on epiglottis  08/2002  . ESOPHAGOGASTRODUODENOSCOPY  11/15/2011   Procedure: ESOPHAGOGASTRODUODENOSCOPY (EGD);  Surgeon: Juanita Craver, MD;  Location: [facility];  Service: Endoscopy;  Laterality: N/A;  . LAPAROSCOPIC APPENDECTOMY N/A 06/06/2014   Procedure: APPENDECTOMY LAPAROSCOPIC attemted;  Surgeon:  Odis Hollingshead, MD;  Location: [facility];  Service: General;  Laterality: N/A;  . LUMBAR DISC SURGERY     two  holes in spinalcovering  . stent     LAD DUS 2004  . STERNAL WOUND DEBRIDEMENT Left 08/31/2019   Procedure: LEFT STERNOCLAVICULAR WOUND DEBRIDEMENT WITH APPLICATION OF WOUND VAC;  Surgeon: Grace Isaac, MD;  Location: [place];  Service: Thoracic;  Laterality: Left;  . surgery l4-l5   1998   ruptured x 3  . ulnar neuropathy    . UMBILICAL HERNIA REPAIR     mesh    Family  History  Problem Relation Age of Onset  . Thyroid disease Mother   . Ovarian cancer Mother   . Breast cancer Mother   . Lung cancer Father   . Cancer Father         Current Outpatient Medications:  .  acetaminophen (TYLENOL) 325 MG tablet, Take 650 mg by mouth every 6 (six) hours as needed for mild pain., Disp: , Rfl:  .  albuterol (PROVENTIL HFA;VENTOLIN HFA) 108 (90 Base) MCG/ACT inhaler, Inhale 2 puffs into the lungs every 6 (six) hours as needed for wheezing or shortness of breath., Disp: 1 Inhaler, Rfl: 0 .  ALPHAGAN P 0.1 % SOLN, Place 1 drop into both eyes 3 (three) times daily. , Disp: , Rfl: 3 .  atorvastatin (LIPITOR) 80 MG tablet, TAKE 1 TABLET BY MOUTH  DAILY, Disp: 90 tablet, Rfl: 3 .  BREO ELLIPTA 200-25 MCG/INH AEPB, INHALE 1 PUFF INTO THE LUNGS EVERY DAY, Disp: 60 each, Rfl: 2 .  cephALEXin (KEFLEX) 500 MG capsule, Take 1 capsule (500 mg total) by mouth See admin instructions. Start 1 pill four times a day x 5 - 7 at the onset of cellulitis flare., Disp: 40 capsule, Rfl: 0 .  diphenhydrAMINE (BENADRYL) 25 MG tablet, Take 25 mg by mouth at bedtime as needed for sleep., Disp: , Rfl:  .  dofetilide (TIKOSYN) 500 MCG capsule, Take 1 capsule (500 mcg total) by mouth 2 (two) times daily., Disp: 180 capsule, Rfl: 2 .  famotidine (PEPCID) 10 MG tablet, Take 10 mg by mouth as needed for heartburn. , Disp: , Rfl:  .  fexofenadine (ALLEGRA) 180 MG tablet, Take 180 mg by mouth daily., Disp: , Rfl:  .  fish oil-omega-3 fatty acids 1000 MG capsule, Take 1 g by mouth 2 (two) times daily. , Disp: , Rfl:  .  fluticasone (FLONASE) 50 MCG/ACT nasal spray, Place 2 sprays into both nostrils daily as needed for allergies., Disp: 48 g, Rfl: prn .  Lancets (ONETOUCH ULTRASOFT) lancets, Twice daily, Disp: 100 each, Rfl: 6 .  magnesium oxide (MAG-OX) 400 (241.3 Mg) MG tablet, Take 1 tablet (400 mg total) by mouth daily. (Patient taking differently: Take 400 mg by mouth 2 (two) times daily. ), Disp:  30 tablet, Rfl: 0 .  metFORMIN (GLUCOPHAGE-XR) 500 MG 24 hr tablet, Take 1 tablet (500 mg total) by mouth 3 (three) times daily., Disp: , Rfl:  .  methocarbamol (ROBAXIN) 500 MG tablet, TAKE 1 TABLET BY MOUTH 4 TIMES A DAY AS NEEDED FOR MUSCLE SPASM, Disp: 120 tablet, Rfl: 2 .  MULTIPLE VITAMIN PO, Take 1 tablet by mouth every evening. , Disp: , Rfl:  .  nitroGLYCERIN (NITROSTAT) 0.4 MG SL tablet, Place 1 tablet (0.4 mg total) under the tongue every 5 (five) minutes as needed for chest pain., Disp: 25 tablet, Rfl: 12 .  ondansetron (ZOFRAN ODT) 4  MG disintegrating tablet, Take 1 tablet (4 mg total) by mouth every 8 (eight) hours as needed for nausea or vomiting., Disp: 24 tablet, Rfl: 0 .  ONETOUCH VERIO test strip, USE AS DIRECTED TWICE A DAY, Disp: 50 strip, Rfl: 12 .  pantoprazole (PROTONIX) 40 MG tablet, Take 40 mg by mouth daily., Disp: , Rfl:  .  PARoxetine (PAXIL) 20 MG tablet, TAKE 1 TABLET BY MOUTH EVERYDAY AT BEDTIME, Disp: 30 tablet, Rfl: 5 .  PARoxetine (PAXIL) 20 MG tablet, Take 0.5 tablets (10 mg total) by mouth daily. With option to increae to 20 mg per day, Disp: 90 tablet, Rfl: 1 .  potassium chloride SA (KLOR-CON M20) 20 MEQ tablet, Take 1 tablet (20 mEq total) by mouth 2 (two) times daily., Disp: 60 tablet, Rfl: 0 .  PRADAXA 150 MG CAPS capsule, TAKE 1 CAPSULE BY MOUTH  EVERY 12 HOURS, Disp: 180 capsule, Rfl: 3 .  tamsulosin (FLOMAX) 0.4 MG CAPS capsule, TAKE 1 CAPSULE BY MOUTH EVERY DAY (Patient taking differently: Take 0.4 mg by mouth daily as needed (repeat kidney stone). ), Disp: 30 capsule, Rfl: 5 .  traZODone (DESYREL) 50 MG tablet, Take 1 tablet (50 mg total) by mouth at bedtime., Disp: 30 tablet, Rfl: 5 .  triamcinolone cream (KENALOG) 0.1 %, Apply 1 application topically 2 (two) times daily. As needed., Disp: 30 g, Rfl: 0  EXAM: BP Readings from Last 3 Encounters:  01/21/20 126/62  12/20/19 (!) 140/58  12/13/19 (!) 139/58    VITALS per patient if applicable:  reported same 283  GENERAL: alert, oriented, appears well and in no acute distress no resp sx  Nl colore   HEENT: atraumatic, conjunttiva clear, no obvious abnormalities on inspection of external nose and ears  NECK: normal movements of the head and neck  LUNGS: on inspection no signs of respiratory distress, breathing rate appears normal, no obvious gross SOB, gasping or wheezing  CV: no obvious cyanosis  MS: moves all visible extremities without noticeable abnormality  PSYCH/NEURO: pleasant and cooperative, no obvious depression or anxiety, speech and thought processing grossly intact Lab Results  Component Value Date   WBC 8.8 09/30/2019   HGB 13.1 09/30/2019   HCT 38.3 (L) 09/30/2019   PLT 159 09/30/2019   GLUCOSE 142 (H) 09/30/2019   CHOL 85 02/08/2019   TRIG 110.0 02/08/2019   HDL 30.00 (L) 02/08/2019   LDLCALC 33 02/08/2019   ALT 25 09/30/2019   AST 38 09/30/2019   NA 137 09/30/2019   K 4.2 09/30/2019   CL 104 09/30/2019   CREATININE 0.86 09/30/2019   BUN 28 (H) 09/30/2019   CO2 25 09/30/2019   TSH 1.414 08/27/2019   PSA 1.12 07/21/2019   INR 1.2 08/31/2019   HGBA1C 6.4 (A) 01/21/2020   MICROALBUR 18.7 (H) 02/08/2019    ASSESSMENT AND PLAN:  Discussed the following assessment and plan:    ICD-10-CM   1. Medication management  Z79.899   2. Sleep disorder  G47.9   3. Obstructive sleep apnea  G47.33   4. 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   Expectant management and discussion of plan and treatment with opportunity to ask questions and all were answered. The  patient agreed with the plan and demonstrated an understanding of the instructions.   Advised to call back or seek an in-person evaluation if worsening  or having  further concerns . Return in about 4 months (around  06/10/2020) for cpx.    Shanon Ace, MD   See below last visit assessment  Type 2 diabetes mellitus with other circulatory complications ([place]) - Plan: POCT glycosylated hemoglobin (Hb A1C)  Bilateral shoulder pain, unspecified chronicity - Plan: Ambulatory referral to Sports Medicine  Medication management  Sleep disorder Suspect that this sighing and perhaps the anxiety with masking for the CPAP which is new since his hospitalization could be a residual version of PTSD from his severe medical difficulties it has gotten worse since he has weaned off the Paxil.  Since he tolerated it we will go back on low-dose to see if it helps get through this times before the feelings extinguish. Restart  paxil 10 per day opt to inc to 20 per day .  The ROV in 20 mg  If needed .   It may help the sleep also?  Plan referral to Sport medicine in regard to the bilateral shoulder pain and help with rehabilitation musculoskeletal wise.  His diabetes is in good control. He has an appointment to get the Covid vaccine discussed the Shingrix vaccine to be gotten at pharmacy. Discussed use of compression socks stockings he has I believe the 15-20 He will observe for secondary infection as needed.  Plan rov in 3-4 weeks virtual about medication and sleep. Does not appear that the trazodone is helping him that much and may be having a side effect.  He used Belsomra in the past but it is very expensive $150 for 90 days. trazodone causes  Bad dreams   Currently  Will follow -Patient advised to return or notify health care team  if  new concerns arise.  Standley Brooking. Salvador Coupe M.D.    Patient Instructions by Burnis Medin, MD at 01/21/2020 11:15 AM Author: Burnis Medin, MD Author Type:  Filed: 01/21/2020 12:01 PM  Note Status: Addendum Cosign:  Encounter Date: 01/21/2020  Editor: Burnis Medin, MD (Physician)  Prior Versions: 1. Kathrene Sinopoli, Standley Brooking, MD (Physician) at 01/21/2020 11:58 AM - Signed     Lets try  to add back  paxil 10 mg per day  Consider  Increasing but make  Virtual visit in 3-4 weeks.   Maybe will help sleep.   consider getting pulse ox  .  Plan future labs when appropriate .   [name]    Making  referral to  Sports medicine  For reasons discussed  And rehab plan.  Get your covid vaccine   shingrix at pharmacy .  Agree with compression stockings   First thing on arising.

## 2020-02-16 ENCOUNTER — Ambulatory Visit: Payer: Self-pay

## 2020-02-16 ENCOUNTER — Other Ambulatory Visit: Payer: Self-pay

## 2020-02-16 ENCOUNTER — Ambulatory Visit (INDEPENDENT_AMBULATORY_CARE_PROVIDER_SITE_OTHER): Payer: Medicare Other | Admitting: Family Medicine

## 2020-02-16 ENCOUNTER — Encounter: Payer: Self-pay | Admitting: Family Medicine

## 2020-02-16 VITALS — BP 128/56 | HR 68 | Ht 73.0 in | Wt 289.0 lb

## 2020-02-16 DIAGNOSIS — M7522 Bicipital tendinitis, left shoulder: Secondary | ICD-10-CM | POA: Diagnosis not present

## 2020-02-16 DIAGNOSIS — M7521 Bicipital tendinitis, right shoulder: Secondary | ICD-10-CM | POA: Diagnosis not present

## 2020-02-16 DIAGNOSIS — G8929 Other chronic pain: Secondary | ICD-10-CM | POA: Diagnosis not present

## 2020-02-16 DIAGNOSIS — M25512 Pain in left shoulder: Secondary | ICD-10-CM

## 2020-02-16 DIAGNOSIS — M25511 Pain in right shoulder: Secondary | ICD-10-CM

## 2020-02-16 NOTE — Patient Instructions (Signed)
Good to see you.  Ice 20 minutes 2 times daily. Usually after activity and before bed. Exercises 3 times a week.  Compression sleeve pennsaid pinkie amount topically 2 times daily as needed.  Hands in peripheral vision Vitamin D 2000-5000 IU daily  See me again in 5-6 weeks

## 2020-02-16 NOTE — Progress Notes (Signed)
[facility] [place] Phone: [phone] Subjective:    [name], am serving as a scribe for Dr. Hulan Saas. This visit occurred during the SARS-CoV-2 public health emergency.  Safety protocols were in place, including screening questions prior to the visit, additional usage of staff PPE, and extensive cleaning of exam room while observing appropriate contact time as indicated for disinfecting solutions.   I'm seeing this patient by the request  of:  Panosh, Standley Brooking, MD  CC: Bilateral shoulder pain  [id number]  [name] [name]. is a 69 y.o. male coming in with complaint of bilateral shoulder pain. Was in hospital for MSSA in September 2020. Infection settled in left clavicle which had to be debrided. Was unable to walk so he used his arms a lot to get up and down. Pain is over anterior shoulder into the middle deltoid. By Christmas he was walking on his own so he was using his arms less. Patient is having trouble putting on a jacket and taking his shirt off and on. Has robaxin rx.  Patient did have an MRI of the left shoulder September 01, 2019.  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  Interstitial tearing noted of the bicep tendinosis and a low-grade partial tear of the rotator cuff on the left had an MRI of the sternum done in August 31, 2019 that did show patient having early osteomyelitis with a new synovitis     Past Medical History:  Diagnosis Date  . Atrial fibrillation ([place]) 03/22/2009   a. s/p multiple DCCV; b. no coumadin due to low TE risk profile; c. Tikosyn Rx  . Coronary atherosclerosis of native coronary artery 11/2002   a. s/p stent to LAD 12/03; OM2 occluded at cath 12/03; d. myoview 5/10: no ischemia;  e. echo 7/11: EF 55%, BAE, mild RVE, PASP 41-45; Myoview was in March 2013. There was no ischemia or infarction, EF 51%   . Cutaneous abscess of back  excluding buttocks 07/04/2014   Appears to stem from possibly a cyst very large area 6 cm contact surgeon office   . Diabetes mellitus without complication ([place])   . Drusen body    see opth note  . ERECTILE DYSFUNCTION 03/22/2009  . GERD 03/22/2009  . HYPERGLYCEMIA 04/25/2010  . HYPERLIPIDEMIA 03/22/2009  . 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 LATERAL EPICONDYLITIS, LEFT 10/24/2009  . LIVER FUNCTION TESTS, ABNORMAL 04/25/2010  . Local reaction to immunization 05/05/2012   minor resolving  zostavax   . Myocardial infarction ([place]) mi2003  . Numbness in left leg    foot related to back disease and surgery  . Obesity, unspecified 04/24/2009  . Perforated appendicitis with necrosis s/p open appendectomy 06/07/14 06/04/2014  . Renal cyst    Characterized by MRI as simple  . Ruptured suppurative appendicitis    2015   . SLEEP APNEA, OBSTRUCTIVE 03/22/2009   compliant with CPAP  . THROMBOCYTOPENIA 08/16/2010  . TOBACCO USE, QUIT 10/24/2009  . 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 Umbilical hernia    Past Surgical History:  Procedure Laterality Date  . APPENDECTOMY N/A 06/06/2014   Procedure: APPENDECTOMY;  Surgeon: Odis Hollingshead, MD;  Location: [facility];  Service: General;  Laterality: N/A;  . COLON SURGERY    . COLONOSCOPY  11/15/2011   Procedure: COLONOSCOPY;  Surgeon: Juanita Craver, MD;  Location: [facility];  Service:  Endoscopy;  Laterality: N/A;  . COLONOSCOPY WITH PROPOFOL N/A 01/03/2017   Procedure: COLONOSCOPY WITH PROPOFOL;  Surgeon: Carol Ada, MD;  Location: [facility];  Service: Endoscopy;  Laterality: N/A;  . cyst on epiglottis  08/2002  . ESOPHAGOGASTRODUODENOSCOPY  11/15/2011   Procedure: ESOPHAGOGASTRODUODENOSCOPY (EGD);  Surgeon: Juanita Craver, MD;  Location: [facility];  Service: Endoscopy;  Laterality: N/A;  . LAPAROSCOPIC APPENDECTOMY N/A 06/06/2014   Procedure: APPENDECTOMY LAPAROSCOPIC attemted;  Surgeon: Odis Hollingshead, MD;  Location: [facility];  Service: General;  Laterality: N/A;  . LUMBAR DISC SURGERY     two  holes in spinalcovering  . stent     LAD DUS 2004  . STERNAL WOUND DEBRIDEMENT Left 08/31/2019   Procedure: LEFT STERNOCLAVICULAR WOUND DEBRIDEMENT WITH APPLICATION OF WOUND VAC;  Surgeon: Grace Isaac, MD;  Location: [facility];  Service: Thoracic;  Laterality: Left;  . surgery l4-l5   1998   ruptured x 3  . ulnar neuropathy    . UMBILICAL HERNIA REPAIR     mesh   Social History   Socioeconomic History  . Marital status: Married    Spouse name: Not on file  . Number of children: Not on file  . Years of education: Not on file  . Highest education level: Not on file  Occupational History  . Not on file  Tobacco Use  . Smoking status: Former Smoker    Packs/day: 2.00    Years: 42.00    Pack years: 84.00    Types: Cigarettes    Quit date: 12/30/2008    Years since quitting: 11.1  . Smokeless tobacco: Never Used  . Tobacco comment: started at age 24; 1-2 ppd; quit 2010  Substance and Sexual Activity  . Alcohol use: No    Alcohol/week: 0.0 standard drinks    Comment: rarely; maybe 1 beer a year  . Drug use: No  . Sexual activity: Yes  Other Topics Concern  . Not on file  Social History Narrative   Retired paramedic   Regular exercise-yes not recently    Has children   Wife is overweight and has fibromyalgia and depression on disability doesn't go out much. Back surgery    Has older dog   Retired from [employer] 31 years of service now 7 years    Caffeine- minimal   Social Determinants of Health   Financial Resource Strain:   . Difficulty of Paying Living Expenses: Not on file  Food Insecurity:   . Worried About Charity fundraiser in the Last Year: Not on file  . Ran Out of Food in the Last Year: Not on file  Transportation Needs:   . Lack of Transportation (Medical): Not on file  . Lack of Transportation (Non-Medical): Not on file  Physical Activity:   . Days of Exercise per Week: Not on file  .  Minutes of Exercise per Session: Not on file  Stress:   . Feeling of Stress : Not on file  Social Connections:   . Frequency of Communication with Friends and Family: Not on file  . Frequency of Social Gatherings with Friends and Family: Not on file  . Attends Religious Services: Not on file  . Active Member of Clubs or Organizations: Not on file  . Attends Archivist Meetings: Not on file  . Marital Status: Not on file   Allergies  Allergen Reactions  . Pseudoephedrine Other (See Comments)    Patient went into afib  . Diltiazem  Rash and Itching    Also 2019  . Novocain [Procaine] Hives    Dentist appointment in 1968; since then has tolerated lidocaine and provocaine with no hives or difficulty.  . Quinolones     Patient was warned about not using Cipro and similar antibiotics. Recent studies have raised concern that fluoroquinolone antibiotics could be associated with an increased risk of aortic aneurysm Fluoroquinolones have non-antimicrobial properties that might jeopardise the integrity of the extracellular matrix of the vascular wall In a  propensity score matched cohort study in [place], there was a 66% increased rate of aortic aneurysm or dissection associated with oral fluoroquinolone use, compared wit  . Pseudoephedrine Hcl Palpitations  . Sulfonamide Derivatives Other (See Comments)    Childhood reaction    Family History  Problem Relation Age of Onset  . Thyroid disease Mother   . Ovarian cancer Mother   . Breast cancer Mother   . Lung cancer Father   . Cancer Father     Current Outpatient Medications (Endocrine & Metabolic):  .  metFORMIN (GLUCOPHAGE-XR) 500 MG 24 hr tablet, Take 1 tablet (500 mg total) by mouth 3 (three) times daily.  Current Outpatient Medications (Cardiovascular):  .  atorvastatin (LIPITOR) 80 MG tablet, TAKE 1 TABLET BY MOUTH  DAILY .  dofetilide (TIKOSYN) 500 MCG capsule, Take 1 capsule (500 mcg total) by mouth 2 (two) times  daily. .  nitroGLYCERIN (NITROSTAT) 0.4 MG SL tablet, Place 1 tablet (0.4 mg total) under the tongue every 5 (five) minutes as needed for chest pain.  Current Outpatient Medications (Respiratory):  .  albuterol (PROVENTIL HFA;VENTOLIN HFA) 108 (90 Base) MCG/ACT inhaler, Inhale 2 puffs into the lungs every 6 (six) hours as needed for wheezing or shortness of breath. [name]  BREO ELLIPTA 200-25 MCG/INH AEPB, INHALE 1 PUFF INTO THE LUNGS EVERY DAY .  diphenhydrAMINE (BENADRYL) 25 MG tablet, Take 25 mg by mouth at bedtime as needed for sleep. .  fexofenadine (ALLEGRA) 180 MG tablet, Take 180 mg by mouth daily. .  fluticasone (FLONASE) 50 MCG/ACT nasal spray, Place 2 sprays into both nostrils daily as needed for allergies.  Current Outpatient Medications (Analgesics):  .  acetaminophen (TYLENOL) 325 MG tablet, Take 650 mg by mouth every 6 (six) hours as needed for mild pain.  Current Outpatient Medications (Hematological):  [name]  PRADAXA 150 MG CAPS capsule, TAKE 1 CAPSULE BY MOUTH  EVERY 12 HOURS  Current Outpatient Medications (Other):  [name]  ALPHAGAN P 0.1 % SOLN, Place 1 drop into both eyes 3 (three) times daily.  .  cephALEXin (KEFLEX) 500 MG capsule, Take 1 capsule (500 mg total) by mouth See admin instructions. Start 1 pill four times a day x 5 - 7 at the onset of cellulitis flare. .  famotidine (PEPCID) 10 MG tablet, Take 10 mg by mouth as needed for heartburn.  .  fish oil-omega-3 fatty acids 1000 MG capsule, Take 1 g by mouth 2 (two) times daily.  .  Lancets (ONETOUCH ULTRASOFT) lancets, Twice daily .  magnesium oxide (MAG-OX) 400 (241.3 Mg) MG tablet, Take 1 tablet (400 mg total) by mouth daily. (Patient taking differently: Take 400 mg by mouth 2 (two) times daily. ) .  methocarbamol (ROBAXIN) 500 MG tablet, TAKE 1 TABLET BY MOUTH 4 TIMES A DAY AS NEEDED FOR MUSCLE SPASM .  MULTIPLE VITAMIN PO, Take 1 tablet by mouth every evening.  .  ondansetron (ZOFRAN ODT) 4 MG disintegrating tablet, Take 1  tablet (4 mg total)  by mouth every 8 (eight) hours as needed for nausea or vomiting. [name] VERIO test strip, USE AS DIRECTED TWICE A DAY .  pantoprazole (PROTONIX) 40 MG tablet, Take 40 mg by mouth daily. [name]  PARoxetine (PAXIL) 20 MG tablet, TAKE 1 TABLET BY MOUTH EVERYDAY AT BEDTIME .  PARoxetine (PAXIL) 20 MG tablet, Take 0.5 tablets (10 mg total) by mouth daily. With option to increae to 20 mg per day .  potassium chloride SA (KLOR-CON M20) 20 MEQ tablet, Take 1 tablet (20 mEq total) by mouth 2 (two) times daily. .  tamsulosin (FLOMAX) 0.4 MG CAPS capsule, TAKE 1 CAPSULE BY MOUTH EVERY DAY (Patient taking differently: Take 0.4 mg by mouth daily as needed (repeat kidney stone). ) .  traZODone (DESYREL) 50 MG tablet, Take 1 tablet (50 mg total) by mouth at bedtime. .  triamcinolone cream (KENALOG) 0.1 %, Apply 1 application topically 2 (two) times daily. As needed.   Reviewed prior external information including notes and imaging from  primary care provider As well as notes that were available from care everywhere and other healthcare systems.  Past medical history, social, surgical and family history all reviewed in electronic medical record.  No pertanent information unless stated regarding to the chief complaint.   Review of Systems:  No headache, visual changes, nausea, vomiting, diarrhea, constipation, dizziness, abdominal pain, skin rash, fevers, chills, night sweats, weight loss, swollen lymph nodes, body aches, joint swelling, chest pain, shortness of breath, mood changes. POSITIVE muscle aches  Objective  Blood pressure (!) 128/56, pulse 68, height 6\' 1"  (1.854 m), weight 289 lb (131.1 kg), SpO2 96 %.   General: No apparent distress alert and oriented x3 mood and affect normal, dressed appropriately.  HEENT: Pupils equal, extraocular movements intact  Respiratory: Patient's speak in full sentences and does not appear short of breath  Cardiovascular: No lower extremity edema,  non tender, no erythema  Skin: Warm dry intact with no signs of infection or rash on extremities or on axial skeleton.  Abdomen: Soft nontender  Neuro: Cranial nerves II through XII are intact, neurovascularly intact in all extremities with 2+ DTRs and 2+ pulses.  Lymph: No lymphadenopathy of posterior or anterior cervical chain or axillae bilaterally.  Gait antalgic gait MSK: Patient's shoulders bilaterally show some mild atrophy noted.  Positive speeds bilaterally.  Patient has some mild impingement bilaterally but rotator cuff strength 4 out of 5.  Mild decrease in range of motion    Limited musculoskeletal ultrasound was performed and interpreted bye   Limited ultrasound of patient's right shoulder shows the patient does have some hypoechoic changes with mild intrasubstance tearing of the bicep tendon near the anterior labrum.  Mild increase in Doppler flow in the area.  Hypoechoic changes within the tendon sheath.  Rotator cuff appears some mild degenerative changes but no retraction.  Impression: Bicep tendinitis  [id number]; 15 additional minutes spent for Therapeutic exercises as stated in above notes.  This included exercises focusing on stretching, strengthening, with significant focus on eccentric aspects.   Long term goals include an improvement in range of motion, strength, endurance as well as avoiding reinjury. Patient's frequency would include in 1-2 times a day, 3-5 times a week for a duration of 6-12 weeks. Shoulder Exercises that included:  Basic scapular stabilization to include adduction and depression of scapula Scaption, focusing on proper movement and good control Internal and External rotation utilizing a theraband, with elbow tucked at side entire time Rows with theraband  Which was given  Proper technique shown and discussed handout in great detail with ATC.  All questions were discussed and answered.       Impression and Recommendations:     This case required medical  decision making of moderate complexity. The above documentation has been reviewed and is accurate and complete Lyndal Pulley, DO       Note: This dictation was prepared with Dragon dictation along with smaller phrase technology. Any transcriptional errors that result from this process are unintentional.

## 2020-02-16 NOTE — Assessment & Plan Note (Signed)
Right greater than left.  Discussed with patient in great length about this.  Patient will do more of a compression sleeve, icing regimen, I do think patient has had some atrophy from him being chronically ill for so long.  We discussed topical anti-inflammatories and avoiding oral anti-inflammatories.  Discussed compression sleeve that I think will be beneficial.  Worsening symptoms will consider injections as well as formal physical therapy follow-up again 5 to 6 weeks

## 2020-02-21 ENCOUNTER — Ambulatory Visit (INDEPENDENT_AMBULATORY_CARE_PROVIDER_SITE_OTHER)
Admission: RE | Admit: 2020-02-21 | Discharge: 2020-02-21 | Disposition: A | Discharge status: Home / Self Care | Payer: Medicare Other | Source: Ambulatory Visit | Attending: Pulmonary Disease | Admitting: Pulmonary Disease

## 2020-02-21 ENCOUNTER — Other Ambulatory Visit: Payer: Self-pay

## 2020-02-21 DIAGNOSIS — R918 Other nonspecific abnormal finding of lung field: Secondary | ICD-10-CM

## 2020-02-28 ENCOUNTER — Telehealth: Payer: Self-pay | Admitting: Pulmonary Disease

## 2020-02-28 NOTE — Telephone Encounter (Signed)
CT chest 02/21/20 >> 4.4 cm ascending aorta, atherosclerosis, centrilobular and paraseptal emphysema, b/l lower lung predominant interstitial reticulation with traction BTX and GGO, 8 mm RML nodule, 4 mm RUL nodule, gallstones    Please schedule ROV with me to review CT chest results.

## 2020-02-28 NOTE — Telephone Encounter (Signed)
LVMTCB x 1 for patient. 

## 2020-03-01 ENCOUNTER — Ambulatory Visit: Payer: Medicare Other | Attending: Internal Medicine

## 2020-03-01 DIAGNOSIS — Z23 Encounter for immunization: Secondary | ICD-10-CM | POA: Insufficient documentation

## 2020-03-01 NOTE — Progress Notes (Signed)
   [id number]  Name:  [name] [name].    MRN: [id number] DOB: 1951/02/27  03/01/2020  [name] [name] was observed post Covid-19 immunization for 15 minutes without incident. He was provided with Vaccine Information Sheet and instruction to access the V-Safe system.   [name] [name] was instructed to call 911 with any severe reactions post vaccine: [name] Difficulty breathing  . Swelling of face and throat  . A fast heartbeat  . A bad rash all over body  . Dizziness and weakness   Immunizations Administered    Name Date Dose VIS Date Route   Pfizer COVID-19 Vaccine 03/01/2020  1:54 PM 0.3 mL 12/10/2019 Intramuscular   Manufacturer: [place]   Lot: [id number]   [place]: [id number]

## 2020-03-01 NOTE — Progress Notes (Signed)
   [id number]  Name:  [name] [name].    MRN: [id number] DOB: 10/10/1951  03/01/2020  [name] was observed post Covid-19 immunization for 15 minutes without incident. He was provided with Vaccine Information Sheet and instruction to access the V-Safe system.   [name] was instructed to call 911 with any severe reactions post vaccine: [name] Difficulty breathing  . Swelling of face and throat  . A fast heartbeat  . A bad rash all over body  . Dizziness and weakness   Immunizations Administered    Name Date Dose VIS Date Route   Pfizer COVID-19 Vaccine 03/01/2020  1:54 PM 0.3 mL 12/10/2019 Intramuscular   Manufacturer: [place]   Lot: [id number]   [place]: [id number]

## 2020-03-06 ENCOUNTER — Other Ambulatory Visit: Payer: Self-pay | Admitting: Cardiology

## 2020-03-17 ENCOUNTER — Telehealth (HOSPITAL_COMMUNITY): Payer: Self-pay

## 2020-03-17 NOTE — Telephone Encounter (Signed)
The above patient or their representative was contacted and gave the following answers to these questions:         Do you have any of the following symptoms?    NO  Fever                    Cough                   Shortness of breath  Do  you have any of the following other symptoms?    muscle pain         vomiting,        diarrhea        rash         weakness        red eye        abdominal pain         bruising          bruising or bleeding              joint pain           severe headache    Have you been in contact with someone who was or has been sick in the past 2 weeks?  NO  Yes                 Unsure                         Unable to assess   Does the person that you were in contact with have any of the following symptoms?   Cough         shortness of breath           muscle pain         vomiting,            diarrhea            rash            weakness           fever            red eye           abdominal pain           bruising  or  bleeding                joint pain                severe headache                 COMMENTS OR ACTION PLAN FOR THIS PATIENT:        ALL QUESTIONS WERE ANSWERED/[name] PT IS FULLY VACCINATED/[name]  

## 2020-03-20 ENCOUNTER — Ambulatory Visit (HOSPITAL_COMMUNITY)
Admission: RE | Admit: 2020-03-20 | Discharge: 2020-03-20 | Disposition: A | Discharge status: Home / Self Care | Payer: Medicare Other | Source: Ambulatory Visit | Attending: Surgery | Admitting: Surgery

## 2020-03-20 ENCOUNTER — Other Ambulatory Visit: Payer: Self-pay | Admitting: *Deleted

## 2020-03-20 ENCOUNTER — Other Ambulatory Visit: Payer: Self-pay

## 2020-03-20 ENCOUNTER — Ambulatory Visit (INDEPENDENT_AMBULATORY_CARE_PROVIDER_SITE_OTHER): Payer: Medicare Other | Admitting: Physician Assistant

## 2020-03-20 VITALS — BP 133/58 | HR 51 | Temp 98.0°F | Resp 18 | Ht 73.0 in | Wt 280.0 lb

## 2020-03-20 DIAGNOSIS — I7789 Other specified disorders of arteries and arterioles: Secondary | ICD-10-CM | POA: Diagnosis present

## 2020-03-20 DIAGNOSIS — I723 Aneurysm of iliac artery: Secondary | ICD-10-CM | POA: Diagnosis not present

## 2020-03-20 DIAGNOSIS — I714 Abdominal aortic aneurysm, without rupture, unspecified: Secondary | ICD-10-CM

## 2020-03-20 NOTE — Progress Notes (Signed)
Office Note     CC:  follow up Requesting Provider:  Burnis Medin, MD  HPI: [name] [name]. is a 69 y.o. (21-Mar-1951) male who presents for follow up of right common iliac artery aneurysm. Initially the CIA aneurysm was found incidentally on CT. He denies any new abdominal pain or back pain. He does have some chronic lower back pain secondary to prior surgeries. He does not have any claudication symptoms, lower extremity weakness or numbness, non healing wounds or rest pain.   He does have some chronic venous insufficiency and varicose veins that are asymptomatic except he does occasionally get flares of cellulitis in Right leg>left leg. He treats this with Keflex as needed per ID. He will intermittently wear compression stockings if he notices swelling of lower legs/ankles. He otherwise denies any daily swelling, pain, or bleeding varicose veins.  In August of 2020 he developed several days of severe lower back pain to point of which he could not ambulate, anorexia, and malaise and presented to ER and was found to have MSSA in his L4/L5 at site of prior lumbar surgery as well as his in his left sternoclavicular joint and Bacteremia. He was hospitalized for 5 weeks and had IV antibiotics and wound VAC to left sternoclavicular joint. At the time incidentally on CT Angio Chest/Abd/Pelvis was found to have a ascending thoracic aortic aneurysm of 4.1 cm. The CT also showed that the aneurysmal dilatation of the right CIA aneurysm was stable at 2.1 cm. He more recently had follow up CT Chest on 02/21/20 and the ascending thoracic aortic aneurysm is 4.4 cm. He is scheduled to see Dr. Servando Snare in follow up in the next couple months  Hx of paroxysmal atrial fibrillation since 2005. He has undergone multiple cardioversion's usually when he becomes symptomatic. He remains on Pradaxa and Tikosyn for management  The pt is on a statin for cholesterol management.  The pt not on a daily aspirin.   Other AC:  Pradaxa The pt is not on any medication for hypertension.   The pt is diabetic.  Tobacco hx:  Former smoker, quit 2010  Past Medical History:  Diagnosis Date  . Atrial fibrillation ([place]) 03/22/2009   a. s/p multiple DCCV; b. no coumadin due to low TE risk profile; c. Tikosyn Rx  . Coronary atherosclerosis of native coronary artery 11/2002   a. s/p stent to LAD 12/03; OM2 occluded at cath 12/03; d. myoview 5/10: no ischemia;  e. echo 7/11: EF 55%, BAE, mild RVE, PASP 41-45; Myoview was in March 2013. There was no ischemia or infarction, EF 51%   . Cutaneous abscess of back excluding buttocks 07/04/2014   Appears to stem from possibly a cyst very large area 6 cm contact surgeon office   . Diabetes mellitus without complication ([place])   . Drusen body    see opth note  . ERECTILE DYSFUNCTION 03/22/2009  . GERD 03/22/2009  . HYPERGLYCEMIA 04/25/2010  . HYPERLIPIDEMIA 03/22/2009  . 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 LATERAL EPICONDYLITIS, LEFT 10/24/2009  . LIVER FUNCTION TESTS, ABNORMAL 04/25/2010  . Local reaction to immunization 05/05/2012   minor resolving  zostavax   . Myocardial infarction ([place]) mi2003  . Numbness in left leg    foot related to back disease and surgery  . Obesity, unspecified 04/24/2009  . Perforated appendicitis with necrosis s/p open appendectomy 06/07/14 06/04/2014  . Renal cyst    Characterized by  MRI as simple  . Ruptured suppurative appendicitis    2015   . SLEEP APNEA, OBSTRUCTIVE 03/22/2009   compliant with CPAP  . THROMBOCYTOPENIA 08/16/2010  . TOBACCO USE, QUIT 10/24/2009  . 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 Umbilical hernia     Past Surgical History:  Procedure Laterality Date  . APPENDECTOMY N/A 06/06/2014   Procedure: APPENDECTOMY;  Surgeon: Odis Hollingshead, MD;  Location: [facility];  Service: General;  Laterality: N/A;  . COLON SURGERY    . COLONOSCOPY  11/15/2011   Procedure: COLONOSCOPY;  Surgeon: Juanita Craver, MD;   Location: [facility];  Service: Endoscopy;  Laterality: N/A;  . COLONOSCOPY WITH PROPOFOL N/A 01/03/2017   Procedure: COLONOSCOPY WITH PROPOFOL;  Surgeon: Carol Ada, MD;  Location: [facility];  Service: Endoscopy;  Laterality: N/A;  . cyst on epiglottis  08/2002  . ESOPHAGOGASTRODUODENOSCOPY  11/15/2011   Procedure: ESOPHAGOGASTRODUODENOSCOPY (EGD);  Surgeon: Juanita Craver, MD;  Location: [facility];  Service: Endoscopy;  Laterality: N/A;  . LAPAROSCOPIC APPENDECTOMY N/A 06/06/2014   Procedure: APPENDECTOMY LAPAROSCOPIC attemted;  Surgeon: Odis Hollingshead, MD;  Location: [facility];  Service: General;  Laterality: N/A;  . LUMBAR DISC SURGERY     two  holes in spinalcovering  . stent     LAD DUS 2004  . STERNAL WOUND DEBRIDEMENT Left 08/31/2019   Procedure: LEFT STERNOCLAVICULAR WOUND DEBRIDEMENT WITH APPLICATION OF WOUND VAC;  Surgeon: Grace Isaac, MD;  Location: [place];  Service: Thoracic;  Laterality: Left;  . surgery l4-l5   1998   ruptured x 3  . ulnar neuropathy    . UMBILICAL HERNIA REPAIR     mesh    Social History   Socioeconomic History  . Marital status: Married    Spouse name: Not on file  . Number of children: Not on file  . Years of education: Not on file  . Highest education level: Not on file  Occupational History  . Not on file  Tobacco Use  . Smoking status: Former Smoker    Packs/day: 2.00    Years: 42.00    Pack years: 84.00    Types: Cigarettes    Quit date: 12/30/2008    Years since quitting: 11.2  . Smokeless tobacco: Never Used  . Tobacco comment: started at age 66; 1-2 ppd; quit 2010  Substance and Sexual Activity  . Alcohol use: No    Alcohol/week: 0.0 standard drinks    Comment: rarely; maybe 1 beer a year  . Drug use: No  . Sexual activity: Yes  Other Topics Concern  . Not on file  Social History Narrative   Retired paramedic   Regular exercise-yes not recently    Has children   Wife is overweight and has fibromyalgia and depression  on disability doesn't go out much. Back surgery    Has older dog   Retired from [employer] 31 years of service now 7 years    Caffeine- minimal   Social Determinants of Health   Financial Resource Strain:   . Difficulty of Paying Living Expenses:   Food Insecurity:   . Worried About Charity fundraiser in the Last Year:   . Arboriculturist in the Last Year:   Transportation Needs:   . Film/video editor (Medical):   [name] Lack of Transportation (Non-Medical):   Physical Activity:   . Days of Exercise per Week:   . Minutes of Exercise per Session:   Stress:   . Feeling  of Stress :   Social Connections:   . Frequency of Communication with Friends and Family:   . Frequency of Social Gatherings with Friends and Family:   . Attends Religious Services:   . Active Member of Clubs or Organizations:   . Attends Archivist Meetings:   [name] Marital Status:   Intimate Partner Violence:   . Fear of Current or Ex-Partner:   . Emotionally Abused:   [name] Physically Abused:   . Sexually Abused:     Family History  Problem Relation Age of Onset  . Thyroid disease Mother   . Ovarian cancer Mother   . Breast cancer Mother   . Lung cancer Father   . Cancer Father     Current Outpatient Medications  Medication Sig Dispense Refill  . acetaminophen (TYLENOL) 325 MG tablet Take 650 mg by mouth every 6 (six) hours as needed for mild pain.    [name] albuterol (PROVENTIL HFA;VENTOLIN HFA) 108 (90 Base) MCG/ACT inhaler Inhale 2 puffs into the lungs every 6 (six) hours as needed for wheezing or shortness of breath. 1 Inhaler 0  . ALPHAGAN P 0.1 % SOLN Place 1 drop into both eyes 3 (three) times daily.   3  . atorvastatin (LIPITOR) 80 MG tablet TAKE 1 TABLET BY MOUTH  DAILY 90 tablet 3  . BREO ELLIPTA 200-25 MCG/INH AEPB INHALE 1 PUFF INTO THE LUNGS EVERY DAY 60 each 2  . diphenhydrAMINE (BENADRYL) 25 MG tablet Take 25 mg by mouth at bedtime as needed for sleep.    [name] dofetilide (TIKOSYN) 500 MCG capsule  TAKE 1 CAPSULE BY MOUTH  TWICE DAILY 180 capsule 3  . famotidine (PEPCID) 10 MG tablet Take 10 mg by mouth as needed for heartburn.     . fexofenadine (ALLEGRA) 180 MG tablet Take 180 mg by mouth daily.    . fish oil-omega-3 fatty acids 1000 MG capsule Take 1 g by mouth 2 (two) times daily.     . fluticasone (FLONASE) 50 MCG/ACT nasal spray Place 2 sprays into both nostrils daily as needed for allergies. 48 g prn  . Lancets (ONETOUCH ULTRASOFT) lancets Twice daily 100 each 6  . magnesium oxide (MAG-OX) 400 (241.3 Mg) MG tablet Take 1 tablet (400 mg total) by mouth daily. (Patient taking differently: Take 400 mg by mouth 2 (two) times daily. ) 30 tablet 0  . metFORMIN (GLUCOPHAGE-XR) 500 MG 24 hr tablet Take 1 tablet (500 mg total) by mouth 3 (three) times daily.    . methocarbamol (ROBAXIN) 500 MG tablet TAKE 1 TABLET BY MOUTH 4 TIMES A DAY AS NEEDED FOR MUSCLE SPASM 120 tablet 2  . MULTIPLE VITAMIN PO Take 1 tablet by mouth every evening.     . nitroGLYCERIN (NITROSTAT) 0.4 MG SL tablet Place 1 tablet (0.4 mg total) under the tongue every 5 (five) minutes as needed for chest pain. 25 tablet 12  . ondansetron (ZOFRAN ODT) 4 MG disintegrating tablet Take 1 tablet (4 mg total) by mouth every 8 (eight) hours as needed for nausea or vomiting. 24 tablet 0  . ONETOUCH VERIO test strip USE AS DIRECTED TWICE A DAY 50 strip 12  . pantoprazole (PROTONIX) 40 MG tablet Take 40 mg by mouth daily.    [name] PARoxetine (PAXIL) 20 MG tablet TAKE 1 TABLET BY MOUTH EVERYDAY AT BEDTIME 30 tablet 5  . PARoxetine (PAXIL) 20 MG tablet Take 0.5 tablets (10 mg total) by mouth daily. With option to increae to 20 mg  per day 90 tablet 1  . potassium chloride SA (KLOR-CON M20) 20 MEQ tablet Take 1 tablet (20 mEq total) by mouth 2 (two) times daily. 60 tablet 0  . PRADAXA 150 MG CAPS capsule TAKE 1 CAPSULE BY MOUTH  EVERY 12 HOURS 180 capsule 3  . tamsulosin (FLOMAX) 0.4 MG CAPS capsule TAKE 1 CAPSULE BY MOUTH EVERY DAY (Patient  taking differently: Take 0.4 mg by mouth daily as needed (repeat kidney stone). ) 30 capsule 5  . traZODone (DESYREL) 50 MG tablet Take 1 tablet (50 mg total) by mouth at bedtime. 30 tablet 5  . triamcinolone cream (KENALOG) 0.1 % Apply 1 application topically 2 (two) times daily. As needed. 30 g 0  . cephALEXin (KEFLEX) 500 MG capsule Take 1 capsule (500 mg total) by mouth See admin instructions. Start 1 pill four times a day x 5 - 7 at the onset of cellulitis flare. (Patient not taking: Reported on 03/20/2020) 40 capsule 0   No current facility-administered medications for this visit.    Allergies  Allergen Reactions  . Pseudoephedrine Other (See Comments)    Patient went into afib  . Diltiazem Rash and Itching    Also 2019  . Novocain [Procaine] Hives    Dentist appointment in 1968; since then has tolerated lidocaine and provocaine with no hives or difficulty.  . Quinolones     Patient was warned about not using Cipro and similar antibiotics. Recent studies have raised concern that fluoroquinolone antibiotics could be associated with an increased risk of aortic aneurysm Fluoroquinolones have non-antimicrobial properties that might jeopardise the integrity of the extracellular matrix of the vascular wall In a  propensity score matched cohort study in [place], there was a 66% increased rate of aortic aneurysm or dissection associated with oral fluoroquinolone use, compared wit  . Pseudoephedrine Hcl Palpitations  . Sulfonamide Derivatives Other (See Comments)    Childhood reaction      REVIEW OF SYSTEMS:  Review of Systems  Constitutional: Negative for chills, fever and malaise/fatigue.  HENT: Negative for congestion and sore throat.   Eyes: Negative for blurred vision.  Respiratory: Negative for cough and shortness of breath.   Cardiovascular: Negative for chest pain, palpitations, orthopnea and claudication.  Gastrointestinal: Negative for abdominal pain, constipation, diarrhea,  heartburn, nausea and vomiting.  Genitourinary: Negative for dysuria.  Musculoskeletal: Positive for back pain.  Skin: Negative for rash.  Neurological: Negative for dizziness and weakness.    PHYSICAL EXAMINATION:  Vitals:   03/20/20 0852  BP: (!) 133/58  Pulse: (!) 51  Resp: 18  Temp: 98 F (36.7 C)  TempSrc: Temporal  SpO2: 98%  Weight: 280 lb (127 kg)  Height: 6\' 1"  (1.854 m)    General:  Very pleasant, obese male, not in any acute distress, well appearing. vital signs documented above Gait: Normal HENT: WNL, normocephalic Pulmonary: normal non-labored breathing , without Rales, rhonchi,  wheezing Cardiac: regular HR, without  Murmurs without carotid bruit Abdomen: soft, NT, no masses. Normal bowel sounds. No palpable AAA Skin: without rashes Vascular Exam/Pulses:  Right Left  Radial 2+ (normal) 2+ (normal)  Femoral 2+ (normal) 2+ (normal)  Popliteal 2+ (normal) 2+ (normal)  DP 2+ (normal) 2+ (normal)  PT 2+ (normal) 2+ (normal)   Extremities: without ischemic changes, without Gangrene , without cellulitis; without open wounds; bilateral spider veins and small varicose veins Musculoskeletal: no muscle wasting or atrophy  Neurologic: A&O X 3;  No focal weakness or paresthesias are detected Psychiatric:  The pt has Normal affect.   Non-Invasive Vascular Imaging:   03/20/20 Abdominal Aorta: +-----------+-------+----------+----------+----------+--------+--------+  Location  AP (cm)Trans (cm)PSV (cm/s)Waveform ThrombusComments  +-----------+-------+----------+----------+----------+--------+--------+  Proximal  2.14  2.51   130    biphasic           +-----------+-------+----------+----------+----------+--------+--------+  Mid    1.94  2.22   133    biphasic           +-----------+-------+----------+----------+----------+--------+--------+  Distal   2.04  1.92   147    biphasic            +-----------+-------+----------+----------+----------+--------+--------+  RT CIA Prox1.4  1.5    159    monophasic          +-----------+-------+----------+----------+----------+--------+--------+  RT CIA Mid 2.0  2.1    159                   +-----------+-------+----------+----------+----------+--------+--------+  LT CIA Prox1.4  1.6    143    biphasic           +-----------+-------+----------+----------+----------+--------+--------+   Right IIA: 1.45 x 1.6 cm 269cm/s/biphasic. Abdominal aorta without evidence of abdominal aortic aneurysm. There is evidence of abnormal dilation of the right common iliac artery and internal iliac artery. Elevated velocities throughout the aorta   ASSESSMENT/PLAN:: 69 y.o. male here for follow up for right common iliac artery aneurysm. Non invasive studies today remain stable with R CIA 2.0 cm today. Remains asymptomatic in regard to this aneurysm. -He will continue his statin medication - Advised him to follow up with Cardiothoracic Surgeon Dr. Servando Snare regarding his ascending thoracic aneurysm - AAA small <2.5 cm will continue surveillance follow up - Repair if common iliac artery aneurysms becomes symptomatic or >3.5 cm - Advised to seek immediate medial attention or call 911 if sudden onset of severe abdominal or back pain -The patient will follow up again in 18 months with repeat duplex   Karoline Caldwell, PA-C [facility] [phone]  Clinic MD:   Dr. Trula Slade

## 2020-03-22 ENCOUNTER — Other Ambulatory Visit: Payer: Self-pay | Admitting: *Deleted

## 2020-03-22 ENCOUNTER — Encounter: Payer: Self-pay | Admitting: Family Medicine

## 2020-03-22 ENCOUNTER — Other Ambulatory Visit: Payer: Self-pay

## 2020-03-22 ENCOUNTER — Ambulatory Visit (INDEPENDENT_AMBULATORY_CARE_PROVIDER_SITE_OTHER): Payer: Medicare Other | Admitting: Family Medicine

## 2020-03-22 DIAGNOSIS — I723 Aneurysm of iliac artery: Secondary | ICD-10-CM

## 2020-03-22 DIAGNOSIS — M7522 Bicipital tendinitis, left shoulder: Secondary | ICD-10-CM

## 2020-03-22 DIAGNOSIS — M7521 Bicipital tendinitis, right shoulder: Secondary | ICD-10-CM

## 2020-03-22 NOTE — Assessment & Plan Note (Signed)
Patient is 90% better at this time.  Has been making progress.  Minimal discomfort at any time at this time.  As long as patient is well can follow-up as needed but would like patient to set up another appointment in 8 weeks just in case worsening pain.

## 2020-03-22 NOTE — Progress Notes (Signed)
[name] [name] [address] [place] Phone: [phone] Subjective:   I [name] [name] am serving as a Education administrator for Dr. Hulan Saas.  This visit occurred during the SARS-CoV-2 public health emergency.  Safety protocols were in place, including screening questions prior to the visit, additional usage of staff PPE, and extensive cleaning of exam room while observing appropriate contact time as indicated for disinfecting solutions.   I'm seeing this patient by the request  of:  Panosh, Standley Brooking, MD  CC: Bilateral shoulder pain follow-up  [id number]   02/16/2020 Right greater than left.  Discussed with patient in great length about this.  Patient will do more of a compression sleeve, icing regimen, I do think patient has had some atrophy from him being chronically ill for so long.  We discussed topical anti-inflammatories and avoiding oral anti-inflammatories.  Discussed compression sleeve that I think will be beneficial.  Worsening symptoms will consider injections as well as formal physical therapy follow-up again 5 to 6 weeks  Update 03/22/2020 [name] [name]. is a 69 y.o. male coming in with complaint of bilateral shoulder pain. Patient states he has some pain but overall doing better. Issues with putting jacket on.  Patient states feeling 85% better overall.  Maybe even 90% better.  Not affecting daily activities.  Only when he reaches certain range of motion.      Past Medical History:  Diagnosis Date  . Atrial fibrillation ([place]) 03/22/2009   a. s/p multiple DCCV; b. no coumadin due to low TE risk profile; c. Tikosyn Rx  . Coronary atherosclerosis of native coronary artery 11/2002   a. s/p stent to LAD 12/03; OM2 occluded at cath 12/03; d. myoview 5/10: no ischemia;  e. echo 7/11: EF 55%, BAE, mild RVE, PASP 41-45; Myoview was in March 2013. There was no ischemia or infarction, EF 51%   . Cutaneous abscess of back excluding buttocks  07/04/2014   Appears to stem from possibly a cyst very large area 6 cm contact surgeon office   . Diabetes mellitus without complication ([place])   . Drusen body    see opth note  . ERECTILE DYSFUNCTION 03/22/2009  . GERD 03/22/2009  . HYPERGLYCEMIA 04/25/2010  . HYPERLIPIDEMIA 03/22/2009  . 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 LATERAL EPICONDYLITIS, LEFT 10/24/2009  . LIVER FUNCTION TESTS, ABNORMAL 04/25/2010  . Local reaction to immunization 05/05/2012   minor resolving  zostavax   . Myocardial infarction ([place]) mi2003  . Numbness in left leg    foot related to back disease and surgery  . Obesity, unspecified 04/24/2009  . Perforated appendicitis with necrosis s/p open appendectomy 06/07/14 06/04/2014  . Renal cyst    Characterized by MRI as simple  . Ruptured suppurative appendicitis    2015   . SLEEP APNEA, OBSTRUCTIVE 03/22/2009   compliant with CPAP  . THROMBOCYTOPENIA 08/16/2010  . TOBACCO USE, QUIT 10/24/2009  . 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 Umbilical hernia    Past Surgical History:  Procedure Laterality Date  . APPENDECTOMY N/A 06/06/2014   Procedure: APPENDECTOMY;  Surgeon: Odis Hollingshead, MD;  [facility];  Service: General;  Laterality: N/A;  . COLON SURGERY    . COLONOSCOPY  11/15/2011   Procedure: COLONOSCOPY;  Surgeon: Juanita Craver, MD;  [facility];  Service: Endoscopy;  Laterality: N/A;  . COLONOSCOPY WITH PROPOFOL N/A 01/03/2017   Procedure: COLONOSCOPY WITH  PROPOFOL;  Surgeon: Carol Ada, MD;  [facility];  Service: Endoscopy;  Laterality: N/A;  . cyst on epiglottis  08/2002  . ESOPHAGOGASTRODUODENOSCOPY  11/15/2011   Procedure: ESOPHAGOGASTRODUODENOSCOPY (EGD);  Surgeon: Juanita Craver, MD;  [facility];  Service: Endoscopy;  Laterality: N/A;  . LAPAROSCOPIC APPENDECTOMY N/A 06/06/2014   Procedure: APPENDECTOMY LAPAROSCOPIC attemted;  Surgeon: Odis Hollingshead, MD;  [facility];  Service:  General;  Laterality: N/A;  . LUMBAR DISC SURGERY     two  holes in spinalcovering  . stent     LAD DUS 2004  . STERNAL WOUND DEBRIDEMENT Left 08/31/2019   Procedure: LEFT STERNOCLAVICULAR WOUND DEBRIDEMENT WITH APPLICATION OF WOUND VAC;  Surgeon: Grace Isaac, MD;  Location: [facility];  Service: Thoracic;  Laterality: Left;  . surgery l4-l5   1998   ruptured x 3  . ulnar neuropathy    . UMBILICAL HERNIA REPAIR     mesh   Social History   Socioeconomic History  . Marital status: Married    Spouse name: Not on file  . Number of children: Not on file  . Years of education: Not on file  . Highest education level: Not on file  Occupational History  . Not on file  Tobacco Use  . Smoking status: Former Smoker    Packs/day: 2.00    Years: 42.00    Pack years: 84.00    Types: Cigarettes    Quit date: 12/30/2008    Years since quitting: 11.2  . Smokeless tobacco: Never Used  . Tobacco comment: started at age 73; 1-2 ppd; quit 2010  Substance and Sexual Activity  . Alcohol use: No    Alcohol/week: 0.0 standard drinks    Comment: rarely; maybe 1 beer a year  . Drug use: No  . Sexual activity: Yes  Other Topics Concern  . Not on file  Social History Narrative   Retired paramedic   Regular exercise-yes not recently    Has children   Wife is overweight and has fibromyalgia and depression on disability doesn't go out much. Back surgery    Has older dog   Retired from [employer] 31 years of service now 7 years    Caffeine- minimal   Social Determinants of Health   Financial Resource Strain:   . Difficulty of Paying Living Expenses:   Food Insecurity:   . Worried About Charity fundraiser in the Last Year:   . Arboriculturist in the Last Year:   Transportation Needs:   . Film/video editor (Medical):   [name] Lack of Transportation (Non-Medical):   Physical Activity:   . Days of Exercise per Week:   . Minutes of Exercise per Session:   Stress:   . Feeling of Stress :   Social  Connections:   . Frequency of Communication with Friends and Family:   . Frequency of Social Gatherings with Friends and Family:   . Attends Religious Services:   . Active Member of Clubs or Organizations:   . Attends Archivist Meetings:   [name] Marital Status:    Allergies  Allergen Reactions  . Pseudoephedrine Other (See Comments)    Patient went into afib  . Diltiazem Rash and Itching    Also 2019  . Novocain [Procaine] Hives    Dentist appointment in 1968; since then has tolerated lidocaine and provocaine with no hives or difficulty.  . Quinolones     Patient was warned about not  using Cipro and similar antibiotics. Recent studies have raised concern that fluoroquinolone antibiotics could be associated with an increased risk of aortic aneurysm Fluoroquinolones have non-antimicrobial properties that might jeopardise the integrity of the extracellular matrix of the vascular wall In a  propensity score matched cohort study in [place], there was a 66% increased rate of aortic aneurysm or dissection associated with oral fluoroquinolone use, compared wit  . Pseudoephedrine Hcl Palpitations  . Sulfonamide Derivatives Other (See Comments)    Childhood reaction    Family History  Problem Relation Age of Onset  . Thyroid disease Mother   . Ovarian cancer Mother   . Breast cancer Mother   . Lung cancer Father   . Cancer Father     Current Outpatient Medications (Endocrine & Metabolic):  .  metFORMIN (GLUCOPHAGE-XR) 500 MG 24 hr tablet, Take 1 tablet (500 mg total) by mouth 3 (three) times daily.  Current Outpatient Medications (Cardiovascular):  .  atorvastatin (LIPITOR) 80 MG tablet, TAKE 1 TABLET BY MOUTH  DAILY .  dofetilide (TIKOSYN) 500 MCG capsule, TAKE 1 CAPSULE BY MOUTH  TWICE DAILY .  nitroGLYCERIN (NITROSTAT) 0.4 MG SL tablet, Place 1 tablet (0.4 mg total) under the tongue every 5 (five) minutes as needed for chest pain.  Current Outpatient Medications  (Respiratory):  .  albuterol (PROVENTIL HFA;VENTOLIN HFA) 108 (90 Base) MCG/ACT inhaler, Inhale 2 puffs into the lungs every 6 (six) hours as needed for wheezing or shortness of breath. [name]  BREO ELLIPTA 200-25 MCG/INH AEPB, INHALE 1 PUFF INTO THE LUNGS EVERY DAY .  diphenhydrAMINE (BENADRYL) 25 MG tablet, Take 25 mg by mouth at bedtime as needed for sleep. .  fexofenadine (ALLEGRA) 180 MG tablet, Take 180 mg by mouth daily. .  fluticasone (FLONASE) 50 MCG/ACT nasal spray, Place 2 sprays into both nostrils daily as needed for allergies.  Current Outpatient Medications (Analgesics):  .  acetaminophen (TYLENOL) 325 MG tablet, Take 650 mg by mouth every 6 (six) hours as needed for mild pain.  Current Outpatient Medications (Hematological):  [name]  PRADAXA 150 MG CAPS capsule, TAKE 1 CAPSULE BY MOUTH  EVERY 12 HOURS  Current Outpatient Medications (Other):  [name]  ALPHAGAN P 0.1 % SOLN, Place 1 drop into both eyes 3 (three) times daily.  .  cephALEXin (KEFLEX) 500 MG capsule, Take 1 capsule (500 mg total) by mouth See admin instructions. Start 1 pill four times a day x 5 - 7 at the onset of cellulitis flare. .  famotidine (PEPCID) 10 MG tablet, Take 10 mg by mouth as needed for heartburn.  .  fish oil-omega-3 fatty acids 1000 MG capsule, Take 1 g by mouth 2 (two) times daily.  .  Lancets (ONETOUCH ULTRASOFT) lancets, Twice daily .  magnesium oxide (MAG-OX) 400 (241.3 Mg) MG tablet, Take 1 tablet (400 mg total) by mouth daily. (Patient taking differently: Take 400 mg by mouth 2 (two) times daily. ) .  methocarbamol (ROBAXIN) 500 MG tablet, TAKE 1 TABLET BY MOUTH 4 TIMES A DAY AS NEEDED FOR MUSCLE SPASM .  MULTIPLE VITAMIN PO, Take 1 tablet by mouth every evening.  .  ondansetron (ZOFRAN ODT) 4 MG disintegrating tablet, Take 1 tablet (4 mg total) by mouth every 8 (eight) hours as needed for nausea or vomiting. [name] VERIO test strip, USE AS DIRECTED TWICE A DAY .  pantoprazole (PROTONIX) 40 MG tablet,  Take 40 mg by mouth daily. [name]  PARoxetine (PAXIL) 20 MG tablet, TAKE 1 TABLET BY  MOUTH EVERYDAY AT BEDTIME .  PARoxetine (PAXIL) 20 MG tablet, Take 0.5 tablets (10 mg total) by mouth daily. With option to increae to 20 mg per day .  potassium chloride SA (KLOR-CON M20) 20 MEQ tablet, Take 1 tablet (20 mEq total) by mouth 2 (two) times daily. .  tamsulosin (FLOMAX) 0.4 MG CAPS capsule, TAKE 1 CAPSULE BY MOUTH EVERY DAY (Patient taking differently: Take 0.4 mg by mouth daily as needed (repeat kidney stone). ) .  traZODone (DESYREL) 50 MG tablet, Take 1 tablet (50 mg total) by mouth at bedtime. .  triamcinolone cream (KENALOG) 0.1 %, Apply 1 application topically 2 (two) times daily. As needed.   Reviewed prior external information including notes and imaging from  primary care provider As well as notes that were available from care everywhere and other healthcare systems.  Past medical history, social, surgical and family history all reviewed in electronic medical record.  No pertanent information unless stated regarding to the chief complaint.   Review of Systems:  No headache, visual changes, nausea, vomiting, diarrhea, constipation, dizziness, abdominal pain, skin rash, fevers, chills, night sweats, weight loss, swollen lymph nodes, body aches, joint swelling, chest pain, shortness of breath, mood changes. POSITIVE muscle aches  Objective  Blood pressure 122/70, pulse 60, height 6\' 1"  (1.854 m), weight 280 lb (127 kg), SpO2 92 %.   General: No apparent distress alert and oriented x3 mood and affect normal, dressed appropriately.  HEENT: Pupils equal, extraocular movements intact  Respiratory: Patient's speak in full sentences and does not appear short of breath  Cardiovascular: No lower extremity edema, non tender, no erythema  Neuro: Cranial nerves II through XII are intact, neurovascularly intact in all extremities with 2+ DTRs and 2+ pulses.  Gait mild antalgic MSK: Shoulder exam  bilaterally shows that patient does have significant improvement in range of motion.  Still some mild positive Yergason sign right greater than left.  Improvement in range of motion.  Mild cough on the right side.   Impression and Recommendations:     The above documentation has been reviewed and is accurate and complete Lyndal Pulley, DO       Note: This dictation was prepared with Dragon dictation along with smaller phrase technology. Any transcriptional errors that result from this process are unintentional.

## 2020-03-22 NOTE — Patient Instructions (Signed)
Doing great Continue the exercises See me again in 6-8 weeks

## 2020-04-03 ENCOUNTER — Telehealth: Payer: Self-pay | Admitting: Pulmonary Disease

## 2020-04-03 NOTE — Telephone Encounter (Signed)
Entered in error

## 2020-04-03 NOTE — Telephone Encounter (Signed)
Pt is scheduled for 04/19/20 to go over these results..Would you like him seen sooner with APP or will this date be ok?

## 2020-04-03 NOTE — Telephone Encounter (Signed)
Okay to wait for appt on 04/19/20.

## 2020-04-03 NOTE — Telephone Encounter (Signed)
Please confirm that pt has received message and schedule ROV.

## 2020-04-18 NOTE — Progress Notes (Signed)
HPI: FU hx of CAD s/p stent to LAD in 2003, parox AFib, HL, OSA. He has seen Dr. Rayann Heman and ablation could be considered if he lost weight. Previous rash with cardizem; on tikosyn. Has had previous cardioversions for atrial fibrillation. Carotid Dopplers June 2019 showed 1 to 39% bilateral stenosis. Echocardiogram August 2020 showed normal LV function, mild left atrial enlargement and trace aortic insufficiency. Abdominal ultrasound March 2021 showed no aneurysm.  The right and left iliac arteries were dilated (followed by vascular surgery).  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  Since last seen,there is no chest pain, dyspnea, palpitations or syncope.  No bleeding.  Current Outpatient Medications  Medication Sig Dispense Refill  . acetaminophen (TYLENOL) 325 MG tablet Take 650 mg by mouth every 6 (six) hours as needed for mild pain.    [name] albuterol (PROVENTIL HFA;VENTOLIN HFA) 108 (90 Base) MCG/ACT inhaler Inhale 2 puffs into the lungs every 6 (six) hours as needed for wheezing or shortness of breath. 1 Inhaler 0  . ALPHAGAN P 0.1 % SOLN Place 1 drop into both eyes 3 (three) times daily.   3  . atorvastatin (LIPITOR) 80 MG tablet TAKE 1 TABLET BY MOUTH  DAILY 90 tablet 3  . BREO ELLIPTA 200-25 MCG/INH AEPB INHALE 1 PUFF INTO THE LUNGS EVERY DAY 60 each 2  . diphenhydrAMINE (BENADRYL) 25 MG tablet Take 25 mg by mouth at bedtime as needed for sleep.    [name] dofetilide (TIKOSYN) 500 MCG capsule TAKE 1 CAPSULE BY MOUTH  TWICE DAILY 180 capsule 3  . famotidine (PEPCID) 10 MG tablet Take 10 mg by mouth as needed for heartburn.     . fexofenadine (ALLEGRA) 180 MG tablet Take 180 mg by mouth daily.    . fish oil-omega-3 fatty acids 1000 MG capsule Take 1 g by mouth 2 (two) times daily.     . fluticasone (FLONASE) 50 MCG/ACT nasal spray Place 2 sprays into both nostrils daily as needed for  allergies. 48 g prn  . Lancets (ONETOUCH ULTRASOFT) lancets Twice daily 100 each 6  . magnesium oxide (MAG-OX) 400 (241.3 Mg) MG tablet Take 1 tablet (400 mg total) by mouth daily. (Patient taking differently: Take 400 mg by mouth 2 (two) times daily. ) 30 tablet 0  . metFORMIN (GLUCOPHAGE-XR) 500 MG 24 hr tablet Take 1 tablet (500 mg total) by mouth 3 (three) times daily.    . methocarbamol (ROBAXIN) 500 MG tablet TAKE 1 TABLET BY MOUTH 4 TIMES A DAY AS NEEDED FOR MUSCLE SPASM 120 tablet 2  . MULTIPLE VITAMIN PO Take 1 tablet by mouth every evening.     . nitroGLYCERIN (NITROSTAT) 0.4 MG SL tablet Place 1 tablet (0.4 mg total) under the tongue every 5 (five) minutes as needed for chest pain. 25 tablet 12  . ondansetron (ZOFRAN ODT) 4 MG disintegrating tablet Take 1 tablet (4 mg total) by mouth every 8 (eight) hours as needed for nausea or vomiting. 24 tablet 0  . ONETOUCH VERIO test strip USE AS DIRECTED TWICE A DAY 50 strip 12  . pantoprazole (PROTONIX) 40 MG tablet Take 40 mg by mouth daily.    [name] PARoxetine (PAXIL) 20 MG tablet TAKE 1 TABLET BY MOUTH EVERYDAY AT BEDTIME 30 tablet 5  . PARoxetine (PAXIL) 20 MG tablet Take 0.5 tablets (10 mg total) by mouth daily. With option to increae to 20  mg per day 90 tablet 1  . potassium chloride SA (KLOR-CON M20) 20 MEQ tablet Take 1 tablet (20 mEq total) by mouth 2 (two) times daily. 60 tablet 0  . PRADAXA 150 MG CAPS capsule TAKE 1 CAPSULE BY MOUTH  EVERY 12 HOURS 180 capsule 3  . tamsulosin (FLOMAX) 0.4 MG CAPS capsule TAKE 1 CAPSULE BY MOUTH EVERY DAY (Patient taking differently: Take 0.4 mg by mouth daily as needed (repeat kidney stone). ) 30 capsule 5  . traZODone (DESYREL) 50 MG tablet Take 1 tablet (50 mg total) by mouth at bedtime. 30 tablet 5  . triamcinolone cream (KENALOG) 0.1 % Apply 1 application topically 2 (two) times daily. As needed. 30 g 0   No current facility-administered medications for this visit.     Past Medical History:   Diagnosis Date  . Atrial fibrillation ([place]) 03/22/2009   a. s/p multiple DCCV; b. no coumadin due to low TE risk profile; c. Tikosyn Rx  . Coronary atherosclerosis of native coronary artery 11/2002   a. s/p stent to LAD 12/03; OM2 occluded at cath 12/03; d. myoview 5/10: no ischemia;  e. echo 7/11: EF 55%, BAE, mild RVE, PASP 41-45; Myoview was in March 2013. There was no ischemia or infarction, EF 51%   . Cutaneous abscess of back excluding buttocks 07/04/2014   Appears to stem from possibly a cyst very large area 6 cm contact surgeon office   . Diabetes mellitus without complication ([place])   . Drusen body    see opth note  . ERECTILE DYSFUNCTION 03/22/2009  . GERD 03/22/2009  . HYPERGLYCEMIA 04/25/2010  . HYPERLIPIDEMIA 03/22/2009  . 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 LATERAL EPICONDYLITIS, LEFT 10/24/2009  . LIVER FUNCTION TESTS, ABNORMAL 04/25/2010  . Local reaction to immunization 05/05/2012   minor resolving  zostavax   . Myocardial infarction ([place]) mi2003  . Numbness in left leg    foot related to back disease and surgery  . Obesity, unspecified 04/24/2009  . Perforated appendicitis with necrosis s/p open appendectomy 06/07/14 06/04/2014  . Renal cyst    Characterized by MRI as simple  . Ruptured suppurative appendicitis    2015   . SLEEP APNEA, OBSTRUCTIVE 03/22/2009   compliant with CPAP  . THROMBOCYTOPENIA 08/16/2010  . TOBACCO USE, QUIT 10/24/2009  . 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 Umbilical hernia     Past Surgical History:  Procedure Laterality Date  . APPENDECTOMY N/A 06/06/2014   Procedure: APPENDECTOMY;  Surgeon: Odis Hollingshead, MD;  [facility];  Service: General;  Laterality: N/A;  . COLON SURGERY    . COLONOSCOPY  11/15/2011   Procedure: COLONOSCOPY;  Surgeon: Juanita Craver, MD;  [facility];  Service: Endoscopy;  Laterality: N/A;  . COLONOSCOPY WITH PROPOFOL N/A 01/03/2017   Procedure: COLONOSCOPY WITH PROPOFOL;   Surgeon: Carol Ada, MD;  [facility];  Service: Endoscopy;  Laterality: N/A;  . cyst on epiglottis  08/2002  . ESOPHAGOGASTRODUODENOSCOPY  11/15/2011   Procedure: ESOPHAGOGASTRODUODENOSCOPY (EGD);  Surgeon: Juanita Craver, MD;  [facility];  Service: Endoscopy;  Laterality: N/A;  . LAPAROSCOPIC APPENDECTOMY N/A 06/06/2014   Procedure: APPENDECTOMY LAPAROSCOPIC attemted;  Surgeon: Odis Hollingshead, MD;  [facility];  Service: General;  Laterality: N/A;  . LUMBAR DISC SURGERY     two  holes in spinalcovering  . stent     LAD DUS 2004  . STERNAL WOUND DEBRIDEMENT Left  08/31/2019   Procedure: LEFT STERNOCLAVICULAR WOUND DEBRIDEMENT WITH APPLICATION OF WOUND VAC;  Surgeon: Grace Isaac, MD;  Location: [facility];  Service: Thoracic;  Laterality: Left;  . surgery l4-l5   1998   ruptured x 3  . ulnar neuropathy    . UMBILICAL HERNIA REPAIR     mesh    Social History   Socioeconomic History  . Marital status: Married    Spouse name: Not on file  . Number of children: Not on file  . Years of education: Not on file  . Highest education level: Not on file  Occupational History  . Not on file  Tobacco Use  . Smoking status: Former Smoker    Packs/day: 2.00    Years: 42.00    Pack years: 84.00    Types: Cigarettes    Quit date: 12/30/2008    Years since quitting: 11.3  . Smokeless tobacco: Never Used  . Tobacco comment: started at age 29; 1-2 ppd; quit 2010  Substance and Sexual Activity  . Alcohol use: No    Alcohol/week: 0.0 standard drinks    Comment: rarely; maybe 1 beer a year  . Drug use: No  . Sexual activity: Yes  Other Topics Concern  . Not on file  Social History Narrative   Retired paramedic   Regular exercise-yes not recently    Has children   Wife is overweight and has fibromyalgia and depression on disability doesn't go out much. Back surgery    Has older dog   Retired from [employer] 31 years of service now 7 years    Caffeine- minimal    Social Determinants of Health   Financial Resource Strain:   . Difficulty of Paying Living Expenses:   Food Insecurity:   . Worried About Charity fundraiser in the Last Year:   . Arboriculturist in the Last Year:   Transportation Needs:   . Film/video editor (Medical):   [name] Lack of Transportation (Non-Medical):   Physical Activity:   . Days of Exercise per Week:   . Minutes of Exercise per Session:   Stress:   . Feeling of Stress :   Social Connections:   . Frequency of Communication with Friends and Family:   . Frequency of Social Gatherings with Friends and Family:   . Attends Religious Services:   . Active Member of Clubs or Organizations:   . Attends Archivist Meetings:   [name] Marital Status:   Intimate Partner Violence:   . Fear of Current or Ex-Partner:   . Emotionally Abused:   [name] Physically Abused:   . Sexually Abused:     Family History  Problem Relation Age of Onset  . Thyroid disease Mother   . Ovarian cancer Mother   . Breast cancer Mother   . Lung cancer Father   . Cancer Father     ROS: no fevers or chills, productive cough, hemoptysis, dysphasia, odynophagia, melena, hematochezia, dysuria, hematuria, rash, seizure activity, orthopnea, PND, pedal edema, claudication. Remaining systems are negative.  Physical Exam: Well-developed well-nourished in no acute distress.  Skin is warm and dry.  HEENT is normal.  Neck is supple.  Chest is clear to auscultation with normal expansion.  Cardiovascular exam is regular rate and rhythm.  Abdominal exam nontender or distended. No masses palpated. Extremities show no edema. neuro grossly intact  ECG-sinus rhythm at a rate of 65, PVC, first-degree AV block, RV conduction delay.  Personally reviewed  A/P  1 paroxysmal atrial fibrillation-patient remains in sinus rhythm today.  Continue Tikosyn and Pradaxa.  Will check renal function and magnesium as well as hemoglobin.  2 coronary artery disease-no  chest pain.  Continue statin.  No aspirin given need for anticoagulation.  3 hyperlipidemia-continue statin.  Check lipids and liver.  4 dilated thoracic aortic root-plan follow-up chest CTA Feb 2022.  Note blood pressure is elevated today but he states systolic typically 0000000.  If increase in the future we will consider ARB.  5 iliac aneurysm-monitored by vascular surgery.  6 morbid obesity-patient continues to do well with weight loss.  [name], MD

## 2020-04-19 ENCOUNTER — Ambulatory Visit (INDEPENDENT_AMBULATORY_CARE_PROVIDER_SITE_OTHER): Payer: Medicare Other | Admitting: Pulmonary Disease

## 2020-04-19 ENCOUNTER — Encounter: Payer: Self-pay | Admitting: Pulmonary Disease

## 2020-04-19 ENCOUNTER — Other Ambulatory Visit: Payer: Self-pay

## 2020-04-19 VITALS — BP 118/72 | HR 61 | Temp 97.5°F | Ht 73.0 in | Wt 278.4 lb

## 2020-04-19 DIAGNOSIS — J432 Centrilobular emphysema: Secondary | ICD-10-CM

## 2020-04-19 DIAGNOSIS — G4733 Obstructive sleep apnea (adult) (pediatric): Secondary | ICD-10-CM | POA: Diagnosis not present

## 2020-04-19 NOTE — Progress Notes (Signed)
[facility]  Chief Complaint  Patient presents with  . Follow-up    F/U for CT scan and OSA. States despite using cpap machine, his AHI is still according to [facility].     Constitutional:  BP 118/72   Pulse 61   Temp (!) 97.5 F (36.4 C) (Temporal)   Ht 6\' 1"  (1.854 m)   Wt 278 lb 6.4 oz (126.3 kg)   SpO2 96% Comment: on RA  BMI 36.73 kg/m   Past Medical History:  A fib, CAD, DM, ED, GERD, HLD  Brief Summary:  [name] [name]. is a 69 y.o. male with OSA and emphysema.  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  Slowly improving.  Started on paxil for anxiety.  Breathing okay.  Not having cough, wheeze, sputum, hemoptysis.  Uses CPAP nightly.  Gets mask leak.  Otherwise feels settings are good.  CT chest from February 2021 showed stable nodules since 2019.  Received COVID vaccine.  Physical Exam:   Appearance - well kempt   ENMT - no sinus tenderness, no oral exudate, no LAN, Mallampati 3 airway, no stridor  Respiratory - equal breath sounds bilaterally, no wheezing or rales  CV - s1s2 regular rate and rhythm, no murmurs  Ext - no clubbing, no edema  Skin - no rashes  Psych - normal mood and affect    Assessment/Plan:   COPD with emphysema. - continue breo with prn albuterol - consider changing to LABA/LAMA and d/c ICS at next visit if stable  Lung nodule. - stable since 2019 and benign - no radiographic follow up needed for this  Interstitial lung disease. - stable on recent imaging studies - monitor clinically  Obstructive sleep apnea. - he is compliant with CPAP and reports benefit - main issue is mask leak; discussed techniques to improve mask fit - continue auto CPAP  Insomnia with anxiety. - trazodone, paxil per PCP  Thoracic aortic aneurysm. - f/u with Dr. Servando Snare with [facility]  A total of  32 minutes spent  addressing patient care issues on day of visit.   Follow up:   Patient Instructions  Follow up in 6 months   Signature:  Chesley Mires, MD [place] Pager: [phone] 04/19/2020, 11:15 AM  Flow Sheet     Pulmonary tests:  PFT 03/25/18 >> FEV1 2.93 (85%),FEV1% 81, TLC 5.84 (80%), DLCO 46% A1AT 03/25/18 >> 139, MM  Chest imaging:  HRCT chest 02/25/18 >> atherosclerosis, enlarged PA, centrilobular and paraseptal emphysema, patchy GGO, mild traction BTX, 3 mm nodule RML CT chest 02/04/19 >> 4.5 cm TAA, atherosclerosis, mild/mod centrilobular and paraseptal emphysema, 8 mm RML nodule, patchy GGO and subpleural reticulation with mild traction BTX with basilar predominance no change CT chest 02/21/20 >> 4.4 cm ascending aorta, atherosclerosis, centrilobular and paraseptal emphysema, b/l lower lung predominant interstitial reticulation with traction BTX and GGO, 8 mm RML nodule, 4 mm RUL nodule, gallstones  Sleep tests:  PSG 12/26/02 >> AHI 25 Auto CPAP 03/18/20 to 04/16/20 >> used on 30 of 30 nights with average 5 hrs 15 min.  Average AHI 18.6 with median CPAP 13 and 95 th percentile CPAP 17 cm H2O.  Air leak.  Cardiac tests:  Echo 08/29/19 >> EF 60 to 65%  Medications:   Allergies as of 04/19/2020      Reactions   Pseudoephedrine Other (See Comments)  Patient went into afib   Diltiazem Rash, Itching   Also 2019   Novocain [procaine] Hives   Dentist appointment in 1968; since then has tolerated lidocaine and provocaine with no hives or difficulty.   Quinolones    Patient was warned about not using Cipro and similar antibiotics. Recent studies have raised concern that fluoroquinolone antibiotics could be associated with an increased risk of aortic aneurysm Fluoroquinolones have non-antimicrobial properties that might jeopardise the integrity of the extracellular matrix of the vascular wall In a  propensity score matched cohort study in [place], there was a 66%  increased rate of aortic aneurysm or dissection associated with oral fluoroquinolone use, compared wit   Pseudoephedrine Hcl Palpitations   Sulfonamide Derivatives Other (See Comments)   Childhood reaction       Medication List       Accurate as of April 19, 2020 11:15 AM. If you have any questions, ask your nurse or doctor.        STOP taking these medications   cephALEXin 500 MG capsule Commonly known as: KEFLEX Stopped by: Chesley Mires, MD     TAKE these medications   acetaminophen 325 MG tablet Commonly known as: TYLENOL Take 650 mg by mouth every 6 (six) hours as needed for mild pain.   albuterol 108 (90 Base) MCG/ACT inhaler Commonly known as: VENTOLIN HFA Inhale 2 puffs into the lungs every 6 (six) hours as needed for wheezing or shortness of breath.   Alphagan P 0.1 % Soln Generic drug: brimonidine Place 1 drop into both eyes 3 (three) times daily.   atorvastatin 80 MG tablet Commonly known as: LIPITOR TAKE 1 TABLET BY MOUTH  DAILY   Breo Ellipta 200-25 MCG/INH Aepb Generic drug: fluticasone furoate-vilanterol INHALE 1 PUFF INTO THE LUNGS EVERY DAY   diphenhydrAMINE 25 MG tablet Commonly known as: BENADRYL Take 25 mg by mouth at bedtime as needed for sleep.   dofetilide 500 MCG capsule Commonly known as: TIKOSYN TAKE 1 CAPSULE BY MOUTH  TWICE DAILY   famotidine 10 MG tablet Commonly known as: PEPCID Take 10 mg by mouth as needed for heartburn.   fexofenadine 180 MG tablet Commonly known as: ALLEGRA Take 180 mg by mouth daily.   fish oil-omega-3 fatty acids 1000 MG capsule Take 1 g by mouth 2 (two) times daily.   fluticasone 50 MCG/ACT nasal spray Commonly known as: FLONASE Place 2 sprays into both nostrils daily as needed for allergies.   magnesium oxide 400 (241.3 Mg) MG tablet Commonly known as: MAG-OX Take 1 tablet (400 mg total) by mouth daily. What changed: when to take this   metFORMIN 500 MG 24 hr tablet Commonly known as:  GLUCOPHAGE-XR Take 1 tablet (500 mg total) by mouth 3 (three) times daily.   methocarbamol 500 MG tablet Commonly known as: ROBAXIN TAKE 1 TABLET BY MOUTH 4 TIMES A DAY AS NEEDED FOR MUSCLE SPASM   MULTIPLE VITAMIN PO Take 1 tablet by mouth every evening.   nitroGLYCERIN 0.4 MG SL tablet Commonly known as: NITROSTAT Place 1 tablet (0.4 mg total) under the tongue every 5 (five) minutes as needed for chest pain.   ondansetron 4 MG disintegrating tablet Commonly known as: Zofran ODT Take 1 tablet (4 mg total) by mouth every 8 (eight) hours as needed for nausea or vomiting.   onetouch ultrasoft lancets Twice daily   OneTouch Verio test strip Generic drug: glucose blood USE AS DIRECTED TWICE A DAY   pantoprazole 40 MG tablet Commonly  known as: PROTONIX Take 40 mg by mouth daily.   PARoxetine 20 MG tablet Commonly known as: PAXIL TAKE 1 TABLET BY MOUTH EVERYDAY AT BEDTIME   PARoxetine 20 MG tablet Commonly known as: Paxil Take 0.5 tablets (10 mg total) by mouth daily. With option to increae to 20 mg per day   potassium chloride SA 20 MEQ tablet Commonly known as: Klor-Con M20 Take 1 tablet (20 mEq total) by mouth 2 (two) times daily.   Pradaxa 150 MG Caps capsule Generic drug: dabigatran TAKE 1 CAPSULE BY MOUTH  EVERY 12 HOURS   tamsulosin 0.4 MG Caps capsule Commonly known as: FLOMAX TAKE 1 CAPSULE BY MOUTH EVERY DAY What changed:   when to take this  reasons to take this   traZODone 50 MG tablet Commonly known as: DESYREL Take 1 tablet (50 mg total) by mouth at bedtime.   triamcinolone cream 0.1 % Commonly known as: KENALOG Apply 1 application topically 2 (two) times daily. As needed.       Past Surgical History:  He  has a past surgical history that includes cyst on epiglottis (08/2002); surgery l4-l5  (1998); stent; ulnar neuropathy; Umbilical hernia repair; Lumbar disc surgery; Esophagogastroduodenoscopy (11/15/2011); Colonoscopy (11/15/2011);  laparoscopic appendectomy (N/A, 06/06/2014); Colon surgery; Appendectomy (N/A, 06/06/2014); Colonoscopy with propofol (N/A, 01/03/2017); and Sternal wound debridement (Left, 08/31/2019).  Family History:  His family history includes Breast cancer in his mother; Cancer in his father; Lung cancer in his father; Ovarian cancer in his mother; Thyroid disease in his mother.  Social History:  He  reports that he quit smoking about 11 years ago. His smoking use included cigarettes. He has a 84.00 pack-year smoking history. He has never used smokeless tobacco. He reports that he does not drink alcohol or use drugs.

## 2020-04-19 NOTE — Patient Instructions (Signed)
Follow up in 6 months 

## 2020-04-24 ENCOUNTER — Encounter: Payer: Self-pay | Admitting: Cardiology

## 2020-04-24 ENCOUNTER — Ambulatory Visit (INDEPENDENT_AMBULATORY_CARE_PROVIDER_SITE_OTHER): Payer: Medicare Other | Admitting: Cardiology

## 2020-04-24 ENCOUNTER — Other Ambulatory Visit: Payer: Self-pay

## 2020-04-24 VITALS — BP 152/72 | HR 65 | Ht 73.0 in | Wt 280.0 lb

## 2020-04-24 DIAGNOSIS — I2510 Atherosclerotic heart disease of native coronary artery without angina pectoris: Secondary | ICD-10-CM | POA: Diagnosis not present

## 2020-04-24 DIAGNOSIS — E7800 Pure hypercholesterolemia, unspecified: Secondary | ICD-10-CM | POA: Diagnosis not present

## 2020-04-24 DIAGNOSIS — I480 Paroxysmal atrial fibrillation: Secondary | ICD-10-CM

## 2020-04-24 NOTE — Patient Instructions (Signed)
Medication Instructions:  NO CHANGES IN MEDICATIONS. *If you need a refill on your cardiac medications before your next appointment, please call your pharmacy*   Lab Work: Your physician recommends that you return for lab work [place]. If you have labs (blood work) drawn today and your tests are completely normal, you will receive your results only by: [name] MyChart Message (if you have MyChart) OR . A paper copy in the mail If you have any lab test that is abnormal or we need to change your treatment, we will call you to review the results.    Follow-Up: At [facility], you and your health needs are our priority.  As part of our continuing mission to provide you with exceptional heart care, we have created designated Provider Care Teams.  These Care Teams include your primary Cardiologist (physician) and Advanced Practice Providers (APPs -  Physician Assistants and Nurse Practitioners) who all work together to provide you with the care you need, when you need it.  We recommend signing up for the patient portal called "MyChart".  Sign up information is provided on this After Visit Summary.  MyChart is used to connect with patients for Virtual Visits (Telemedicine).  Patients are able to view lab/test results, encounter notes, upcoming appointments, etc.  Non-urgent messages can be sent to your provider as well.   To learn more about what you can do with MyChart, go to [email or web address].    Your next appointment:   6 month(s)  The format for your next appointment:   In Person  Provider:   You may see [name] MD or one of the following Advanced Practice Providers on your designated Care Team:    Kerin Ransom, PA-C  [place], [place]  Coletta Memos, [place]

## 2020-04-27 LAB — BASIC METABOLIC PANEL
BUN/Creatinine Ratio: 20 (ref 10–24)
BUN: 24 mg/dL (ref 8–27)
CO2: 25 mmol/L (ref 20–29)
Calcium: 9.8 mg/dL (ref 8.6–10.2)
Chloride: 104 mmol/L (ref 96–106)
Creatinine, Ser: 1.19 mg/dL (ref 0.76–1.27)
GFR calc Af Amer: 71 mL/min/{1.73_m2} (ref >59)
GFR calc non Af Amer: 62 mL/min/{1.73_m2} (ref >59)
Glucose: 124 mg/dL — ABNORMAL HIGH (ref 65–99)
Potassium: 5.2 mmol/L (ref 3.5–5.2)
Sodium: 139 mmol/L (ref 134–144)

## 2020-04-27 LAB — CBC
Hematocrit: 43.4 % (ref 37.5–51.0)
Hemoglobin: 14.4 g/dL (ref 13.0–17.7)
MCH: 30.7 pg (ref 26.6–33.0)
MCHC: 33.2 g/dL (ref 31.5–35.7)
MCV: 93 fL (ref 79–97)
Platelets: 199 10*3/uL (ref 150–450)
RBC: 4.69 x10E6/uL (ref 4.14–5.80)
RDW: 13.6 % (ref 11.6–15.4)
WBC: 7.2 10*3/uL (ref 3.4–10.8)

## 2020-04-27 LAB — HEPATIC FUNCTION PANEL
ALT: 70 IU/L — ABNORMAL HIGH (ref 0–44)
AST: 39 IU/L (ref 0–40)
Albumin: 4.1 g/dL (ref 3.8–4.8)
Alkaline Phosphatase: 63 IU/L (ref 39–117)
Bilirubin Total: 1 mg/dL (ref 0.0–1.2)
Bilirubin, Direct: 0.33 mg/dL (ref 0.00–0.40)
Total Protein: 6.8 g/dL (ref 6.0–8.5)

## 2020-04-27 LAB — LIPID PANEL
Chol/HDL Ratio: 1.9 ratio (ref 0.0–5.0)
Cholesterol, Total: 106 mg/dL (ref 100–199)
HDL: 56 mg/dL (ref >39)
LDL Chol Calc (NIH): 37 mg/dL (ref 0–99)
Triglycerides: 54 mg/dL (ref 0–149)
VLDL Cholesterol Cal: 13 mg/dL (ref 5–40)

## 2020-04-27 LAB — MAGNESIUM: Magnesium: 1.8 mg/dL (ref 1.6–2.3)

## 2020-04-27 LAB — HEMOGLOBIN A1C
Est. average glucose Bld gHb Est-mCnc: 137 mg/dL
Hgb A1c MFr Bld: 6.4 % — ABNORMAL HIGH (ref 4.8–5.6)

## 2020-05-01 ENCOUNTER — Other Ambulatory Visit: Payer: Self-pay | Admitting: Gastroenterology

## 2020-05-02 ENCOUNTER — Telehealth: Payer: Self-pay | Admitting: Cardiology

## 2020-05-02 NOTE — Telephone Encounter (Signed)
Left voicemail for patient to call back. 

## 2020-05-02 NOTE — Telephone Encounter (Signed)
Patient with diagnosis of afib on pradaxa for anticoagulation.    Procedure: Clonoscopy Date of procedure: 05/26/2020  CHADS2-VASc score of  3 (AGE, DM2, CAD)  CrCl 87 ml/min  Per office protocol, patient can hold Pradaxa for 1-2 days prior to procedure.

## 2020-05-02 NOTE — Telephone Encounter (Signed)
Pharmacy, please comment regarding holding Pradaxa for colonoscopy. Patient has hx of Afib. Route response to P CV DIV PREOP, thanks!

## 2020-05-02 NOTE — Telephone Encounter (Signed)
   [facility] Pre-operative Risk Assessment    Request for surgical clearance:  1. What type of surgery is being performed? Clonoscopy   2. When is this surgery scheduled? 05/26/20   3. What type of clearance is required (medical clearance vs. Pharmacy clearance to hold med vs. Both)? Pharmacy  4. Are there any medications that need to be held prior to surgery and how long? Pradaxa; not specified   5. Practice name and name of physician performing surgery? [facility]Dr. Carol Ada   6. What is your office phone number [phone]    7.   What is your office fax number [phone]  8.   Anesthesia type (None, local, MAC, general) ? Propofol   [name] [name] 05/02/2020, 10:42 AM  _________________________________________________________________   (provider comments below)

## 2020-05-02 NOTE — Telephone Encounter (Signed)
   Primary Cardiologist: [name], MD  Chart reviewed as part of pre-operative protocol coverage. Patient was contacted 05/02/2020 in reference to pre-operative risk assessment for pending surgery as outlined below.  [name]. was last seen on 04/24/20 by Dr. Stanford Breed.  Since that day, Gaspare Millwee. has been doing fine from a cardiology standpoint.  Therefore, based on ACC/AHA guidelines, the patient would be at acceptable risk for the planned procedure without further cardiovascular testing.   Per Pharmacy- pt ok to hold Pradaxa 1-2 days prior to procedure.   I will route this recommendation to the requesting party via Epic fax function and remove from pre-op pool.  Please call with questions.  Reino Bellis, NP 05/02/2020, 1:57 PM

## 2020-05-03 ENCOUNTER — Other Ambulatory Visit: Payer: Self-pay | Admitting: Physical Medicine and Rehabilitation

## 2020-05-10 ENCOUNTER — Other Ambulatory Visit: Payer: Self-pay

## 2020-05-10 ENCOUNTER — Ambulatory Visit (INDEPENDENT_AMBULATORY_CARE_PROVIDER_SITE_OTHER): Payer: Medicare Other | Admitting: Family Medicine

## 2020-05-10 ENCOUNTER — Encounter: Payer: Self-pay | Admitting: Family Medicine

## 2020-05-10 DIAGNOSIS — M7521 Bicipital tendinitis, right shoulder: Secondary | ICD-10-CM

## 2020-05-10 DIAGNOSIS — M7522 Bicipital tendinitis, left shoulder: Secondary | ICD-10-CM | POA: Diagnosis not present

## 2020-05-10 NOTE — Patient Instructions (Signed)
Doing well Continue exercises MyChart is a great way to get in contact with me

## 2020-05-10 NOTE — Assessment & Plan Note (Signed)
Bilateral shoulder pain that I do think is still some mild bicep but also at the [facility] joint I think is contributing to some of the discomfort.  We discussed the possibility of injections for diagnostic and therapeutic the patient is doing well with conservative therapy he states.  Patient will increase activity slowly and follow-up with me again as needed

## 2020-05-10 NOTE — Progress Notes (Signed)
[facility] [place] Phone: [phone] Subjective:   [name], am serving as a scribe for Dr. Hulan Saas. This visit occurred during the SARS-CoV-2 public health emergency.  Safety protocols were in place, including screening questions prior to the visit, additional usage of staff PPE, and extensive cleaning of exam room while observing appropriate contact time as indicated for disinfecting solutions.   I'm seeing this patient by the request  of:  Panosh, Standley Brooking, MD  CC: Bilateral shoulder pain follow-up  [id number]   03/22/2020 Patient is 90% better at this time.  Has been making progress.  Minimal discomfort at any time at this time.  As long as patient is well can follow-up as needed but would like patient to set up another appointment in 8 weeks just in case worsening pain.  Update 5/12/021 [name] [name]. is a 69 y.o. male coming in with complaint of bilateral shoulder pain. Patient states that he has not had any issues since last visit. Is able to move shoulders in a further range than before without pain. Pain increases with horizontal adduction and reaching overhead to wash his back.        Past Medical History:  Diagnosis Date  . Atrial fibrillation ([place]) 03/22/2009   a. s/p multiple DCCV; b. no coumadin due to low TE risk profile; c. Tikosyn Rx  . Coronary atherosclerosis of native coronary artery 11/2002   a. s/p stent to LAD 12/03; OM2 occluded at cath 12/03; d. myoview 5/10: no ischemia;  e. echo 7/11: EF 55%, BAE, mild RVE, PASP 41-45; Myoview was in March 2013. There was no ischemia or infarction, EF 51%   . Cutaneous abscess of back excluding buttocks 07/04/2014   Appears to stem from possibly a cyst very large area 6 cm contact surgeon office   . Diabetes mellitus without complication ([place])   . Drusen body    see opth note  . ERECTILE DYSFUNCTION 03/22/2009  . GERD 03/22/2009  .  HYPERGLYCEMIA 04/25/2010  . HYPERLIPIDEMIA 03/22/2009  . 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 LATERAL EPICONDYLITIS, LEFT 10/24/2009  . LIVER FUNCTION TESTS, ABNORMAL 04/25/2010  . Local reaction to immunization 05/05/2012   minor resolving  zostavax   . Myocardial infarction ([place]) mi2003  . Numbness in left leg    foot related to back disease and surgery  . Obesity, unspecified 04/24/2009  . Perforated appendicitis with necrosis s/p open appendectomy 06/07/14 06/04/2014  . Renal cyst    Characterized by MRI as simple  . Ruptured suppurative appendicitis    2015   . SLEEP APNEA, OBSTRUCTIVE 03/22/2009   compliant with CPAP  . THROMBOCYTOPENIA 08/16/2010  . TOBACCO USE, QUIT 10/24/2009  . 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 Umbilical hernia    Past Surgical History:  Procedure Laterality Date  . APPENDECTOMY N/A 06/06/2014   Procedure: APPENDECTOMY;  Surgeon: Odis Hollingshead, MD;  Location: [facility];  Service: General;  Laterality: N/A;  . COLON SURGERY    . COLONOSCOPY  11/15/2011   Procedure: COLONOSCOPY;  Surgeon: Juanita Craver, MD;  Location: [facility];  Service: Endoscopy;  Laterality: N/A;  . COLONOSCOPY WITH PROPOFOL N/A 01/03/2017   Procedure: COLONOSCOPY WITH PROPOFOL;  Surgeon: Carol Ada, MD;  Location: [facility];  Service: Endoscopy;  Laterality: N/A;  . cyst on epiglottis  08/2002  . ESOPHAGOGASTRODUODENOSCOPY  11/15/2011   Procedure:  ESOPHAGOGASTRODUODENOSCOPY (EGD);  Surgeon: Juanita Craver, MD;  Location: [facility];  Service: Endoscopy;  Laterality: N/A;  . LAPAROSCOPIC APPENDECTOMY N/A 06/06/2014   Procedure: APPENDECTOMY LAPAROSCOPIC attemted;  Surgeon: Odis Hollingshead, MD;  Location: [facility];  Service: General;  Laterality: N/A;  . LUMBAR DISC SURGERY     two  holes in spinalcovering  . stent     LAD DUS 2004  . STERNAL WOUND DEBRIDEMENT Left 08/31/2019   Procedure: LEFT STERNOCLAVICULAR WOUND DEBRIDEMENT WITH APPLICATION OF WOUND  VAC;  Surgeon: Grace Isaac, MD;  Location: [facility];  Service: Thoracic;  Laterality: Left;  . surgery l4-l5   1998   ruptured x 3  . ulnar neuropathy    . UMBILICAL HERNIA REPAIR     mesh   Social History   Socioeconomic History  . Marital status: Married    Spouse name: Not on file  . Number of children: Not on file  . Years of education: Not on file  . Highest education level: Not on file  Occupational History  . Not on file  Tobacco Use  . Smoking status: Former Smoker    Packs/day: 2.00    Years: 42.00    Pack years: 84.00    Types: Cigarettes    Quit date: 12/30/2008    Years since quitting: 11.3  . Smokeless tobacco: Never Used  . Tobacco comment: started at age 87; 1-2 ppd; quit 2010  Substance and Sexual Activity  . Alcohol use: No    Alcohol/week: 0.0 standard drinks    Comment: rarely; maybe 1 beer a year  . Drug use: No  . Sexual activity: Yes  Other Topics Concern  . Not on file  Social History Narrative   Retired paramedic   Regular exercise-yes not recently    Has children   Wife is overweight and has fibromyalgia and depression on disability doesn't go out much. Back surgery    Has older dog   Retired from [employer] 31 years of service now 7 years    Caffeine- minimal   Social Determinants of Health   Financial Resource Strain:   . Difficulty of Paying Living Expenses:   Food Insecurity:   . Worried About Charity fundraiser in the Last Year:   . Arboriculturist in the Last Year:   Transportation Needs:   . Film/video editor (Medical):   [name] Lack of Transportation (Non-Medical):   Physical Activity:   . Days of Exercise per Week:   . Minutes of Exercise per Session:   Stress:   . Feeling of Stress :   Social Connections:   . Frequency of Communication with Friends and Family:   . Frequency of Social Gatherings with Friends and Family:   . Attends Religious Services:   . Active Member of Clubs or Organizations:   . Attends Theatre manager Meetings:   [name] Marital Status:    Allergies  Allergen Reactions  . Pseudoephedrine Other (See Comments)    Patient went into afib  . Diltiazem Rash and Itching    Also 2019  . Novocain [Procaine] Hives    Dentist appointment in 1968; since then has tolerated lidocaine and provocaine with no hives or difficulty.  . Quinolones     Patient was warned about not using Cipro and similar antibiotics. Recent studies have raised concern that fluoroquinolone antibiotics could be associated with an increased risk of aortic aneurysm Fluoroquinolones have non-antimicrobial properties that might jeopardise the  integrity of the extracellular matrix of the vascular wall In a  propensity score matched cohort study in [place], there was a 66% increased rate of aortic aneurysm or dissection associated with oral fluoroquinolone use, compared wit  . Pseudoephedrine Hcl Palpitations  . Sulfonamide Derivatives Other (See Comments)    Childhood reaction    Family History  Problem Relation Age of Onset  . Thyroid disease Mother   . Ovarian cancer Mother   . Breast cancer Mother   . Lung cancer Father   . Cancer Father     Current Outpatient Medications (Endocrine & Metabolic):  .  metFORMIN (GLUCOPHAGE-XR) 500 MG 24 hr tablet, Take 1 tablet (500 mg total) by mouth 3 (three) times daily.  Current Outpatient Medications (Cardiovascular):  .  atorvastatin (LIPITOR) 80 MG tablet, TAKE 1 TABLET BY MOUTH  DAILY .  dofetilide (TIKOSYN) 500 MCG capsule, TAKE 1 CAPSULE BY MOUTH  TWICE DAILY .  nitroGLYCERIN (NITROSTAT) 0.4 MG SL tablet, Place 1 tablet (0.4 mg total) under the tongue every 5 (five) minutes as needed for chest pain.  Current Outpatient Medications (Respiratory):  .  albuterol (PROVENTIL HFA;VENTOLIN HFA) 108 (90 Base) MCG/ACT inhaler, Inhale 2 puffs into the lungs every 6 (six) hours as needed for wheezing or shortness of breath. [name]  BREO ELLIPTA 200-25 MCG/INH AEPB, INHALE 1 PUFF INTO  THE LUNGS EVERY DAY .  diphenhydrAMINE (BENADRYL) 25 MG tablet, Take 25 mg by mouth at bedtime as needed for sleep. .  fexofenadine (ALLEGRA) 180 MG tablet, Take 180 mg by mouth daily. .  fluticasone (FLONASE) 50 MCG/ACT nasal spray, Place 2 sprays into both nostrils daily as needed for allergies.  Current Outpatient Medications (Analgesics):  .  acetaminophen (TYLENOL) 325 MG tablet, Take 650 mg by mouth every 6 (six) hours as needed for mild pain.  Current Outpatient Medications (Hematological):  [name]  PRADAXA 150 MG CAPS capsule, TAKE 1 CAPSULE BY MOUTH  EVERY 12 HOURS  Current Outpatient Medications (Other):  [name]  ALPHAGAN P 0.1 % SOLN, Place 1 drop into both eyes 3 (three) times daily.  .  famotidine (PEPCID) 10 MG tablet, Take 10 mg by mouth as needed for heartburn.  .  fish oil-omega-3 fatty acids 1000 MG capsule, Take 1 g by mouth 2 (two) times daily.  .  Lancets (ONETOUCH ULTRASOFT) lancets, Twice daily .  magnesium oxide (MAG-OX) 400 (241.3 Mg) MG tablet, Take 1 tablet (400 mg total) by mouth daily. (Patient taking differently: Take 400 mg by mouth 2 (two) times daily. ) .  methocarbamol (ROBAXIN) 500 MG tablet, TAKE 1 TABLET BY MOUTH 4 TIMES A DAY AS NEEDED FOR MUSCLE SPASM .  MULTIPLE VITAMIN PO, Take 1 tablet by mouth every evening.  .  ondansetron (ZOFRAN ODT) 4 MG disintegrating tablet, Take 1 tablet (4 mg total) by mouth every 8 (eight) hours as needed for nausea or vomiting. [name] VERIO test strip, USE AS DIRECTED TWICE A DAY .  pantoprazole (PROTONIX) 40 MG tablet, Take 40 mg by mouth daily. [name]  PARoxetine (PAXIL) 20 MG tablet, TAKE 1 TABLET BY MOUTH EVERYDAY AT BEDTIME .  PARoxetine (PAXIL) 20 MG tablet, Take 0.5 tablets (10 mg total) by mouth daily. With option to increae to 20 mg per day .  potassium chloride SA (KLOR-CON M20) 20 MEQ tablet, Take 1 tablet (20 mEq total) by mouth 2 (two) times daily. .  tamsulosin (FLOMAX) 0.4 MG CAPS capsule, TAKE 1 CAPSULE BY MOUTH  EVERY DAY (  Patient taking differently: Take 0.4 mg by mouth daily as needed (repeat kidney stone). ) .  traZODone (DESYREL) 50 MG tablet, TAKE 1 TABLET BY MOUTH EVERYDAY AT BEDTIME .  triamcinolone cream (KENALOG) 0.1 %, Apply 1 application topically 2 (two) times daily. As needed.   Reviewed prior external information including notes and imaging from  primary care provider As well as notes that were available from care everywhere and other healthcare systems.  Past medical history, social, surgical and family history all reviewed in electronic medical record.  No pertanent information unless stated regarding to the chief complaint.   Review of Systems:  No headache, visual changes, nausea, vomiting, diarrhea, constipation, dizziness, abdominal pain, skin rash, fevers, chills, night sweats, weight loss, swollen lymph nodes, body aches, joint swelling, chest pain, shortness of breath, mood changes. POSITIVE muscle aches  Objective  Blood pressure 128/60, pulse 62, height 6\' 1"  (1.854 m), weight 240 lb (108.9 kg), SpO2 96 %.   General: No apparent distress alert and oriented x3 mood and affect normal, dressed appropriately.  HEENT: Pupils equal, extraocular movements intact  Respiratory: Patient's speak in full sentences and does not appear short of breath  Cardiovascular: No lower extremity edema, non tender, no erythema  Neuro: Cranial nerves II through XII are intact, neurovascularly intact in all extremities with 2+ DTRs and 2+ pulses.  Gait normal with good balance and coordination.  MSK: Bilateral shoulder exam shows patient has more tenderness over the acromioclavicular joint positive crossover otherwise 5 out of 5 strength of the rotator cuff.  Near full range of motion which is an improvement from previous exam.   Impression and Recommendations:      The above documentation has been reviewed and is accurate and complete Lyndal Pulley, DO       Note: This dictation was  prepared with Dragon dictation along with smaller phrase technology. Any transcriptional errors that result from this process are unintentional.

## 2020-05-11 ENCOUNTER — Telehealth: Payer: Self-pay | Admitting: Internal Medicine

## 2020-05-11 NOTE — Chronic Care Management (AMB) (Signed)
  Chronic Care Management   Note  05/11/2020 Name: Antoney Belew. MRN: [id number] DOB: 1951-10-02  [name]. is a 69 y.o. year old male who is a primary care patient of Panosh, Standley Brooking, MD. I reached out to [name]. by phone today in response to a referral sent by Mr. LANDYNN ENGERT Jr.'s PCP, Panosh, Standley Brooking, MD.   Mr. Guimond was given information about Chronic Care Management services today including:  1. CCM service includes personalized support from designated clinical staff supervised by his physician, including individualized plan of care and coordination with other care providers 2. 24/7 contact phone numbers for assistance for urgent and routine care needs. 3. Service will only be billed when office clinical staff spend 20 minutes or more in a month to coordinate care. 4. Only one practitioner may furnish and bill the service in a calendar month. 5. The patient may stop CCM services at any time (effective at the end of the month) by phone call to the office staff.   Patient agreed to services and verbal consent obtained.   Follow up plan:   [facility]

## 2020-05-18 ENCOUNTER — Other Ambulatory Visit: Payer: Self-pay | Admitting: Internal Medicine

## 2020-05-18 ENCOUNTER — Other Ambulatory Visit: Payer: Self-pay | Admitting: Cardiothoracic Surgery

## 2020-05-18 DIAGNOSIS — I712 Thoracic aortic aneurysm, without rupture, unspecified: Secondary | ICD-10-CM

## 2020-05-23 ENCOUNTER — Other Ambulatory Visit (HOSPITAL_COMMUNITY)
Admission: RE | Admit: 2020-05-23 | Discharge: 2020-05-23 | Disposition: A | Discharge status: Home / Self Care | Payer: Medicare Other | Source: Ambulatory Visit | Attending: Gastroenterology | Admitting: Gastroenterology

## 2020-05-23 DIAGNOSIS — Z20822 Contact with and (suspected) exposure to covid-19: Secondary | ICD-10-CM | POA: Insufficient documentation

## 2020-05-23 DIAGNOSIS — Z01812 Encounter for preprocedural laboratory examination: Secondary | ICD-10-CM | POA: Insufficient documentation

## 2020-05-23 LAB — SARS CORONAVIRUS 2 (TAT 6-24 HRS): SARS Coronavirus 2: NEGATIVE

## 2020-05-25 NOTE — Progress Notes (Signed)
Pre-op call done for endo procedure tomorrow. Patient states they have been quarantined and no symptoms of being sick. All questions addressed.

## 2020-05-26 ENCOUNTER — Ambulatory Visit (HOSPITAL_COMMUNITY): Payer: Medicare Other | Admitting: Anesthesiology

## 2020-05-26 ENCOUNTER — Encounter (HOSPITAL_COMMUNITY)
Admission: RE | Disposition: A | Discharge status: Home / Self Care | Payer: Self-pay | Source: Home / Self Care | Attending: Gastroenterology

## 2020-05-26 ENCOUNTER — Ambulatory Visit (HOSPITAL_COMMUNITY)
Admission: RE | Admit: 2020-05-26 | Discharge: 2020-05-26 | Disposition: A | Discharge status: Home / Self Care | Payer: Medicare Other | Attending: Gastroenterology | Admitting: Gastroenterology

## 2020-05-26 ENCOUNTER — Encounter (HOSPITAL_COMMUNITY): Payer: Self-pay | Admitting: Gastroenterology

## 2020-05-26 ENCOUNTER — Other Ambulatory Visit: Payer: Self-pay

## 2020-05-26 DIAGNOSIS — I2510 Atherosclerotic heart disease of native coronary artery without angina pectoris: Secondary | ICD-10-CM | POA: Insufficient documentation

## 2020-05-26 DIAGNOSIS — Z884 Allergy status to anesthetic agent status: Secondary | ICD-10-CM | POA: Diagnosis not present

## 2020-05-26 DIAGNOSIS — Z881 Allergy status to other antibiotic agents status: Secondary | ICD-10-CM | POA: Insufficient documentation

## 2020-05-26 DIAGNOSIS — D122 Benign neoplasm of ascending colon: Secondary | ICD-10-CM | POA: Diagnosis not present

## 2020-05-26 DIAGNOSIS — Z955 Presence of coronary angioplasty implant and graft: Secondary | ICD-10-CM | POA: Insufficient documentation

## 2020-05-26 DIAGNOSIS — Z801 Family history of malignant neoplasm of trachea, bronchus and lung: Secondary | ICD-10-CM | POA: Insufficient documentation

## 2020-05-26 DIAGNOSIS — K635 Polyp of colon: Secondary | ICD-10-CM | POA: Insufficient documentation

## 2020-05-26 DIAGNOSIS — Z803 Family history of malignant neoplasm of breast: Secondary | ICD-10-CM | POA: Diagnosis not present

## 2020-05-26 DIAGNOSIS — Z809 Family history of malignant neoplasm, unspecified: Secondary | ICD-10-CM | POA: Insufficient documentation

## 2020-05-26 DIAGNOSIS — Z888 Allergy status to other drugs, medicaments and biological substances status: Secondary | ICD-10-CM | POA: Diagnosis not present

## 2020-05-26 DIAGNOSIS — E119 Type 2 diabetes mellitus without complications: Secondary | ICD-10-CM | POA: Diagnosis not present

## 2020-05-26 DIAGNOSIS — G4730 Sleep apnea, unspecified: Secondary | ICD-10-CM | POA: Insufficient documentation

## 2020-05-26 DIAGNOSIS — Z1211 Encounter for screening for malignant neoplasm of colon: Secondary | ICD-10-CM | POA: Insufficient documentation

## 2020-05-26 DIAGNOSIS — Z86010 Personal history of colonic polyps: Secondary | ICD-10-CM | POA: Insufficient documentation

## 2020-05-26 DIAGNOSIS — I252 Old myocardial infarction: Secondary | ICD-10-CM | POA: Diagnosis not present

## 2020-05-26 DIAGNOSIS — Z87891 Personal history of nicotine dependence: Secondary | ICD-10-CM | POA: Insufficient documentation

## 2020-05-26 DIAGNOSIS — K5730 Diverticulosis of large intestine without perforation or abscess without bleeding: Secondary | ICD-10-CM | POA: Diagnosis not present

## 2020-05-26 DIAGNOSIS — G4733 Obstructive sleep apnea (adult) (pediatric): Secondary | ICD-10-CM | POA: Insufficient documentation

## 2020-05-26 DIAGNOSIS — E785 Hyperlipidemia, unspecified: Secondary | ICD-10-CM | POA: Diagnosis not present

## 2020-05-26 DIAGNOSIS — D123 Benign neoplasm of transverse colon: Secondary | ICD-10-CM | POA: Insufficient documentation

## 2020-05-26 DIAGNOSIS — E669 Obesity, unspecified: Secondary | ICD-10-CM | POA: Insufficient documentation

## 2020-05-26 DIAGNOSIS — Z882 Allergy status to sulfonamides status: Secondary | ICD-10-CM | POA: Insufficient documentation

## 2020-05-26 DIAGNOSIS — Z6836 Body mass index (BMI) 36.0-36.9, adult: Secondary | ICD-10-CM | POA: Insufficient documentation

## 2020-05-26 DIAGNOSIS — D175 Benign lipomatous neoplasm of intra-abdominal organs: Secondary | ICD-10-CM | POA: Insufficient documentation

## 2020-05-26 DIAGNOSIS — I4891 Unspecified atrial fibrillation: Secondary | ICD-10-CM | POA: Diagnosis not present

## 2020-05-26 DIAGNOSIS — Z8349 Family history of other endocrine, nutritional and metabolic diseases: Secondary | ICD-10-CM | POA: Insufficient documentation

## 2020-05-26 DIAGNOSIS — K219 Gastro-esophageal reflux disease without esophagitis: Secondary | ICD-10-CM | POA: Insufficient documentation

## 2020-05-26 DIAGNOSIS — Z8041 Family history of malignant neoplasm of ovary: Secondary | ICD-10-CM | POA: Insufficient documentation

## 2020-05-26 HISTORY — PX: POLYPECTOMY: SHX5525

## 2020-05-26 HISTORY — PX: COLONOSCOPY WITH PROPOFOL: SHX5780

## 2020-05-26 LAB — GLUCOSE, CAPILLARY: Glucose-Capillary: 123 mg/dL — ABNORMAL HIGH (ref 70–99)

## 2020-05-26 SURGERY — COLONOSCOPY WITH PROPOFOL
Anesthesia: Monitor Anesthesia Care

## 2020-05-26 MED ORDER — PROPOFOL 10 MG/ML IV BOLUS
INTRAVENOUS | Status: DC | PRN
  Administered 2020-05-26 (×15): 20 mg via INTRAVENOUS

## 2020-05-26 MED ORDER — LACTATED RINGERS IV SOLN
INTRAVENOUS | Status: DC
  Administered 2020-05-26: 1000 mL via INTRAVENOUS

## 2020-05-26 MED ORDER — SODIUM CHLORIDE 0.9 % IV SOLN: INTRAVENOUS | Status: DC

## 2020-05-26 SURGICAL SUPPLY — 21 items

## 2020-05-26 NOTE — Anesthesia Procedure Notes (Signed)
Procedure Name: MAC Date/Time: 05/26/2020 10:08 AM Performed by: Cynda Familia, CRNA Pre-anesthesia Checklist: Patient identified, Emergency Drugs available, Suction available, Patient being monitored and Timeout performed Patient Re-evaluated:Patient Re-evaluated prior to induction Placement Confirmation: positive ETCO2 and breath sounds checked- equal and bilateral Dental Injury: Teeth and Oropharynx as per pre-operative assessment

## 2020-05-26 NOTE — Op Note (Signed)
[facility] Patient Name: [name] [name] Procedure Date: 05/26/2020 MRN: [id number] Attending MD: Carol Ada , MD Date of Birth: Oct 05, 1951 CSN: [id number] Age: 69 Admit Type: Outpatient Procedure:                Colonoscopy Indications:              High risk colon cancer surveillance: Personal                            history of colonic polyps Providers:                Carol Ada, MD, Cleda Daub, RN, Cherylynn Ridges,                            Technician, Glenis Smoker, CRNA Referring MD:              Medicines:                Propofol per Anesthesia Complications:            No immediate complications. Estimated Blood Loss:     Estimated blood loss: none. Procedure:                Pre-Anesthesia Assessment:                           - Prior to the procedure, a History and Physical                            was performed, and patient medications and                            allergies were reviewed. The patient's tolerance of                            previous anesthesia was also reviewed. The risks                            and benefits of the procedure and the sedation                            options and risks were discussed with the patient.                            All questions were answered, and informed consent                            was obtained. Prior Anticoagulants: The patient has                            taken Pradaxa (dabigatran), last dose was 5 days                            prior to procedure. ASA Grade Assessment: III - A  patient with severe systemic disease. After                            reviewing the risks and benefits, the patient was                            deemed in satisfactory condition to undergo the                            procedure.                           - Sedation was administered by an anesthesia                            professional. Deep sedation was attained.                 After obtaining informed consent, the colonoscope                            was passed under direct vision. Throughout the                            procedure, the patient's blood pressure, pulse, and                            oxygen saturations were monitored continuously. The                            [id number] Olympus colonoscope was                            introduced through the anus and advanced to the the                            cecum, identified by appendiceal orifice and                            ileocecal valve. The colonoscopy was performed                            without difficulty. The patient tolerated the                            procedure well. The quality of the bowel                            preparation was good. The ileocecal valve,                            appendiceal orifice, and rectum were photographed. Scope In: 10:13:27 AM Scope Out: 10:34:19 AM Scope Withdrawal Time: 0 hours 18 minutes 30 seconds  Total Procedure Duration: 0 hours 20 minutes 52 seconds  Findings:      Seven sessile polyps were found in the sigmoid  colon, descending colon,       proximal transverse colon and ascending colon. The polyps were 2 to 7 mm       in size. These polyps were removed with a cold snare. Resection and       retrieval were complete.      Scattered small and large-mouthed diverticula were found in the sigmoid       colon.      There was a medium-sized lipoma, in the transverse colon.      Multiple polyps were identified in the sigmoid colon and a       representative sample was obtained. Impression:               - Seven 2 to 7 mm polyps in the sigmoid colon, in                            the descending colon, in the proximal transverse                            colon and in the ascending colon, removed with a                            cold snare. Resected and retrieved.                           - Diverticulosis in the sigmoid  colon.                           - Medium-sized lipoma in the transverse colon. Moderate Sedation:      Not Applicable - Patient had care per Anesthesia. Recommendation:           - Patient has a contact number available for                            emergencies. The signs and symptoms of potential                            delayed complications were discussed with the                            patient. Return to normal activities tomorrow.                            Written discharge instructions were provided to the                            patient.                           - Resume previous diet.                           - Continue present medications.                           - Await pathology results.                           -  Repeat colonoscopy in 3 years for surveillance.                           - Resume Pradaxa. Procedure Code(s):        --- Professional ---                           [phone], Colonoscopy, flexible; with removal of                            tumor(s), polyp(s), or other lesion(s) by snare                            technique Diagnosis Code(s):        --- Professional ---                           K63.5, Polyp of colon                           Z86.010, Personal history of colonic polyps                           D17.5, Benign lipomatous neoplasm of                            intra-abdominal organs                           K57.30, Diverticulosis of large intestine without                            perforation or abscess without bleeding CPT copyright 2019 [employer]. All rights reserved. The codes documented in this report are preliminary and upon coder review may  be revised to meet current compliance requirements. Carol Ada, MD Carol Ada, MD 05/26/2020 10:41:25 AM This report has been signed electronically. Number of Addenda: 0

## 2020-05-26 NOTE — Discharge Instructions (Signed)

## 2020-05-26 NOTE — H&P (Signed)
[name] [name]. HPI: The patient's colonoscopy on 01/03/2017 was positive for four adenomas. Since the last evaluation the patient denies any issues with hematochezia, melena, or constipation. He does have diarrhea as a result of being on magnesium. The patient was hospitalized in the Fall of 2020 for sepsis secondary to [name] [name]. He continues to use Pradaxa for his afib and he is managed by [name] [name].    Past Medical History:  Diagnosis Date  . Atrial fibrillation ([place]) 03/22/2009   a. s/p multiple DCCV; b. no coumadin due to low TE risk profile; c. Tikosyn Rx  . Coronary atherosclerosis of native coronary artery 11/2002   a. s/p stent to LAD 12/03; OM2 occluded at cath 12/03; d. myoview 5/10: no ischemia;  e. echo 7/11: EF 55%, BAE, mild RVE, PASP 41-45; Myoview was in March 2013. There was no ischemia or infarction, EF 51%   . Cutaneous abscess of back excluding buttocks 07/04/2014   Appears to stem from possibly a cyst very large area 6 cm contact surgeon office   . Diabetes mellitus without complication ([place])   . Drusen body    see opth note  . ERECTILE DYSFUNCTION 03/22/2009  . GERD 03/22/2009  . HYPERGLYCEMIA 04/25/2010  . HYPERLIPIDEMIA 03/22/2009  . 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 LATERAL EPICONDYLITIS, LEFT 10/24/2009  . LIVER FUNCTION TESTS, ABNORMAL 04/25/2010  . Local reaction to immunization 05/05/2012   minor resolving  zostavax   . Myocardial infarction ([place]) mi2003  . Numbness in left leg    foot related to back disease and surgery  . Obesity, unspecified 04/24/2009  . Perforated appendicitis with necrosis s/p open appendectomy 06/07/14 06/04/2014  . Renal cyst    Characterized by MRI as simple  . Ruptured suppurative appendicitis    2015   . SLEEP APNEA, OBSTRUCTIVE 03/22/2009   compliant with CPAP  . THROMBOCYTOPENIA 08/16/2010  . TOBACCO USE, QUIT 10/24/2009  . 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 Umbilical hernia     Past  Surgical History:  Procedure Laterality Date  . APPENDECTOMY N/A 06/06/2014   Procedure: APPENDECTOMY;  Surgeon: [name] Hollingshead, MD;  Location: [facility];  Service: General;  Laterality: N/A;  . COLON SURGERY    . COLONOSCOPY  11/15/2011   Procedure: COLONOSCOPY;  Surgeon: [name] Craver, MD;  Location: [facility];  Service: Endoscopy;  Laterality: N/A;  . COLONOSCOPY WITH PROPOFOL N/A 01/03/2017   Procedure: COLONOSCOPY WITH PROPOFOL;  Surgeon: [name] Ada, MD;  Location: [facility];  Service: Endoscopy;  Laterality: N/A;  . cyst on epiglottis  08/2002  . ESOPHAGOGASTRODUODENOSCOPY  11/15/2011   Procedure: ESOPHAGOGASTRODUODENOSCOPY (EGD);  Surgeon: [name] Craver, MD;  Location: [facility];  Service: Endoscopy;  Laterality: N/A;  . LAPAROSCOPIC APPENDECTOMY N/A 06/06/2014   Procedure: APPENDECTOMY LAPAROSCOPIC attemted;  Surgeon: [name] Hollingshead, MD;  Location: [facility];  Service: General;  Laterality: N/A;  . LUMBAR DISC SURGERY     two  holes in spinalcovering  . stent     LAD DUS 2004  . STERNAL WOUND DEBRIDEMENT Left 08/31/2019   Procedure: LEFT STERNOCLAVICULAR WOUND DEBRIDEMENT WITH APPLICATION OF WOUND VAC;  Surgeon: Grace Isaac, MD;  Location: [facility];  Service: Thoracic;  Laterality: Left;  . surgery l4-l5   1998   ruptured x 3  . ulnar neuropathy    . UMBILICAL HERNIA REPAIR     mesh    Family History  Problem Relation  Age of Onset  . Thyroid disease Mother   . Ovarian cancer Mother   . Breast cancer Mother   . Lung cancer Father   . Cancer Father     Social History:  reports that he quit smoking about 11 years ago. His smoking use included cigarettes. He has a 84.00 pack-year smoking history. He has never used smokeless tobacco. He reports that he does not drink alcohol or use drugs.  Allergies:  Allergies  Allergen Reactions  . Pseudoephedrine Other (See Comments)    Patient went into afib  . Diltiazem Rash and Itching    Also 2019  . Novocain [Procaine] Hives     Dentist appointment in 1958; since then has tolerated lidocaine and provocaine with no hives or difficulty.  . Quinolones     Patient was warned about not using Cipro and similar antibiotics. Recent studies have raised concern that fluoroquinolone antibiotics could be associated with an increased risk of aortic aneurysm Fluoroquinolones have non-antimicrobial properties that might jeopardise the integrity of the extracellular matrix of the vascular wall In a  propensity score matched cohort study in [place], there was a 66% increased rate of aortic aneurysm or dissection associated with oral fluoroquinolone use, compared wit  . Sulfonamide Derivatives Other (See Comments)    Childhood reaction     Medications:  Scheduled:  Continuous: . sodium chloride    . lactated ringers 1,000 mL (05/26/20 0819)    Results for orders placed or performed during the hospital encounter of 05/26/20 (from the past 24 hour(s))  Glucose, capillary     Status: Abnormal   Collection Time: 05/26/20  8:12 AM  Result Value Ref Range   Glucose-Capillary 123 (H) 70 - 99 mg/dL     No results found.  ROS:  As stated above in the HPI otherwise negative.  Blood pressure (!) 148/48, pulse (!) 56, temperature 97.7 F (36.5 C), temperature source Oral, resp. rate 18, height 6\' 1"  (1.854 m), weight 127 kg, SpO2 92 %.    PE: Gen: NAD, Alert and Oriented HEENT:  [place]/AT, EOMI Neck: Supple, no LAD Lungs: CTA Bilaterally CV: RRR without M/G/R ABD: Soft, NTND, +BS Ext: No C/C/E  Assessment/Plan: 1) Personal history of polyps - colonoscopy.  [name] D 05/26/2020, 8:20 AM

## 2020-05-26 NOTE — Anesthesia Preprocedure Evaluation (Signed)
Anesthesia Evaluation  Patient identified by MRN, date of birth, ID band Patient awake    Reviewed: Allergy & Precautions, NPO status , Patient's Chart, lab work & pertinent test results  History of Anesthesia Complications Negative for: history of anesthetic complications  Airway Mallampati: III  TM Distance: >3 FB Neck ROM: Full    Dental  (+) Caps   Pulmonary sleep apnea , former smoker,    Pulmonary exam normal        Cardiovascular + CAD, + Past MI (2003) and + Cardiac Stents (2003)  Normal cardiovascular exam+ dysrhythmias Atrial Fibrillation      Neuro/Psych negative neurological ROS  negative psych ROS   GI/Hepatic Neg liver ROS, GERD  ,  Endo/Other  diabetes  Renal/GU negative Renal ROS  negative genitourinary   Musculoskeletal negative musculoskeletal ROS (+)   Abdominal   Peds  Hematology Pradaxa   Anesthesia Other Findings  Echo 08/29/19: EF 60-65%, no significant valve abnormality  Myoview 2013: No significant ST segment change suggestive of ischemia. Normal stress nuclear study. Images very similar to 2010. The tomographic images are noisey, but there are no abnormalities. LV Ejection Fraction: 51%.  LV Wall Motion:  Normal Wall Motion  Reproductive/Obstetrics                             Anesthesia Physical Anesthesia Plan  ASA: III  Anesthesia Plan: MAC   Post-op Pain Management:    Induction: Intravenous  PONV Risk Score and Plan: 1 and Propofol infusion, TIVA and Treatment may vary due to age or medical condition  Airway Management Planned: Natural Airway, Nasal Cannula and Simple Face Mask  Additional Equipment: None  Intra-op Plan:   Post-operative Plan:   Informed Consent: I have reviewed the patients History and Physical, chart, labs and discussed the procedure including the risks, benefits and alternatives for the proposed anesthesia with the patient or  authorized representative who has indicated his/her understanding and acceptance.       Plan Discussed with:   Anesthesia Plan Comments:         Anesthesia Quick Evaluation

## 2020-05-26 NOTE — Transfer of Care (Signed)
Immediate Anesthesia Transfer of Care Note  Patient: [name] [name].  Procedure(s) Performed: COLONOSCOPY WITH PROPOFOL (N/A ) POLYPECTOMY  Patient Location: PACU- GI  Anesthesia Type:MAC  Level of Consciousness: awake and alert   Airway & Oxygen Therapy: Patient Spontanous Breathing and Patient connected to face mask oxygen  Post-op Assessment: Report given to RN and Post -op Vital signs reviewed and stable  Post vital signs: Reviewed and stable  Last Vitals:  Vitals Value Taken Time  BP    Temp    Pulse 68 05/26/20 1043  Resp 13 05/26/20 1043  SpO2 98 % 05/26/20 1043  Vitals shown include unvalidated device data.  Last Pain:  Vitals:   05/26/20 0814  TempSrc: Oral  PainSc: 0-No pain         Complications: No apparent anesthesia complications

## 2020-05-26 NOTE — Anesthesia Postprocedure Evaluation (Signed)
Anesthesia Post Note  Patient: [name] [name].  Procedure(s) Performed: COLONOSCOPY WITH PROPOFOL (N/A ) POLYPECTOMY     Patient location during evaluation: Endoscopy Anesthesia Type: MAC Level of consciousness: awake and alert Pain management: pain level controlled Vital Signs Assessment: post-procedure vital signs reviewed and stable Respiratory status: spontaneous breathing, nonlabored ventilation and respiratory function stable Cardiovascular status: blood pressure returned to baseline and stable Postop Assessment: no apparent nausea or vomiting Anesthetic complications: no    Last Vitals:  Vitals:   05/26/20 1043 05/26/20 1050  BP: (!) 119/40 (!) 140/49  Pulse: 68 (!) 56  Resp: 13 (!) 21  Temp: 36.4 C   SpO2: 98% 93%    Last Pain:  Vitals:   05/26/20 1050  TempSrc:   PainSc: 0-No pain                 Lidia Collum

## 2020-05-30 ENCOUNTER — Encounter: Payer: Self-pay | Admitting: *Deleted

## 2020-05-30 LAB — SURGICAL PATHOLOGY

## 2020-06-13 ENCOUNTER — Encounter: Payer: Medicare Other | Admitting: Internal Medicine

## 2020-06-14 ENCOUNTER — Other Ambulatory Visit: Payer: Self-pay

## 2020-06-14 DIAGNOSIS — E1159 Type 2 diabetes mellitus with other circulatory complications: Secondary | ICD-10-CM

## 2020-06-14 DIAGNOSIS — E7800 Pure hypercholesterolemia, unspecified: Secondary | ICD-10-CM

## 2020-06-14 NOTE — Chronic Care Management (AMB) (Signed)
Chronic Care Management Pharmacy  Name: [name] [name].  MRN: [id number] DOB: 1951/06/01    Chief Complaint/ HPI  [name] Chimes.,  69 y.o. , male presents for their Initial CCM visit with the clinical pharmacist via telephone due to COVID-19 Pandemic. Patient is a retired Audiological scientist for 31 years and he lives with his wife. He had hx of MSSA bacteremia last 07/2019. He has been doing good since then and he mentions the thought of not surviving that incident then. He was walking at least 2 miles a day before that incident happen, but has not had any attempts to go back to walking recently. He mentions paroxetine helping him with his anxiety and he tries to take 0.5 tablet, but sometimes has to take 1 tablet as needed. He has lost weight and now he weighs around 280 lbs. He has no present concerns today during the visit.   PCP : Burnis Medin, MD  Their chronic conditions include: hx of MI, Afib, type 2 diabetes, GERD, HLD, ilian aneurysm, insomnia with anxiety, COPD   Office Visits: 02/11/20 Telemedicine - added paroxetine 10 mg daily. No changes with other meds.  Consult Visit: 05/13/20 OV Tamala Julian, Sports Med) - biceps tendinitis. Recommend getting shots. No pain medications started.  Medications: Outpatient Encounter Medications as of 06/20/2020  Medication Sig Note  . acetaminophen (TYLENOL) 325 MG tablet Take 650 mg by mouth every 6 (six) hours as needed for mild pain.   [name] albuterol (PROVENTIL HFA;VENTOLIN HFA) 108 (90 Base) MCG/ACT inhaler Inhale 2 puffs into the lungs every 6 (six) hours as needed for wheezing or shortness of breath. (Patient taking differently: Inhale 1 puff into the lungs every 6 (six) hours as needed for wheezing or shortness of breath. )   . ALPHAGAN P 0.1 % SOLN Place 1 drop into both eyes 3 (three) times daily.    [name] atorvastatin (LIPITOR) 80 MG tablet TAKE 1 TABLET BY MOUTH  DAILY (Patient taking differently: Take 80 mg by mouth daily. )   . BREO ELLIPTA  200-25 MCG/INH AEPB INHALE 1 PUFF INTO THE LUNGS EVERY DAY (Patient taking differently: Inhale 1 puff into the lungs daily. )   . Cholecalciferol (VITAMIN D) 50 MCG (2000 UT) tablet Take 2,000 Units by mouth 2 (two) times daily.   . diphenhydrAMINE (BENADRYL) 25 MG tablet Take 25-50 mg by mouth at bedtime as needed for sleep.    [name] dofetilide (TIKOSYN) 500 MCG capsule TAKE 1 CAPSULE BY MOUTH  TWICE DAILY (Patient taking differently: Take 500 mcg by mouth 2 (two) times daily. )   . famotidine (PEPCID) 10 MG tablet Take 10 mg by mouth as needed for heartburn.    . fexofenadine (ALLEGRA) 180 MG tablet Take 180 mg by mouth daily as needed for allergies.    . fish oil-omega-3 fatty acids 1000 MG capsule Take 1 g by mouth 2 (two) times daily.    . fluticasone (FLONASE) 50 MCG/ACT nasal spray Place 2 sprays into both nostrils daily as needed for allergies. (Patient taking differently: Place 1 spray into both nostrils daily. )   . Lancets (ONETOUCH ULTRASOFT) lancets Twice daily   . Magnesium 250 MG TABS Take 250 mg by mouth daily. Patient takes 250 mg AM and 400 mg PM   . magnesium oxide (MAG-OX) 400 (241.3 Mg) MG tablet Take 1 tablet (400 mg total) by mouth daily. (Patient taking differently: Take 400 mg by mouth at bedtime. ) 05/18/2020: Pt takes 250  qam and 400 qhs  . metFORMIN (GLUCOPHAGE-XR) 500 MG 24 hr tablet Take 1 tablet (500 mg total) by mouth 3 (three) times daily.   . methocarbamol (ROBAXIN) 500 MG tablet TAKE 1 TABLET BY MOUTH 4 TIMES A DAY AS NEEDED FOR MUSCLE SPASM (Patient taking differently: Take 500 mg by mouth 4 (four) times daily as needed for muscle spasms. )   . MULTIPLE VITAMIN PO Take 1 tablet by mouth every evening.    . nitroGLYCERIN (NITROSTAT) 0.4 MG SL tablet Place 1 tablet (0.4 mg total) under the tongue every 5 (five) minutes as needed for chest pain.   [name] ondansetron (ZOFRAN ODT) 4 MG disintegrating tablet Take 1 tablet (4 mg total) by mouth every 8 (eight) hours as needed for  nausea or vomiting.   [name] VERIO test strip USE AS DIRECTED TWICE A DAY   . PARoxetine (PAXIL) 20 MG tablet Take 0.5 tablets (10 mg total) by mouth daily. With option to increae to 20 mg per day (Patient taking differently: Take 10 mg by mouth daily. )   . potassium chloride SA (KLOR-CON M20) 20 MEQ tablet Take 1 tablet (20 mEq total) by mouth 2 (two) times daily. (Patient taking differently: Take 20 mEq by mouth daily. )   . PRADAXA 150 MG CAPS capsule TAKE 1 CAPSULE BY MOUTH  EVERY 12 HOURS (Patient taking differently: Take 150 mg by mouth every 12 (twelve) hours. )   . tamsulosin (FLOMAX) 0.4 MG CAPS capsule TAKE 1 CAPSULE BY MOUTH EVERY DAY (Patient taking differently: Take 0.4 mg by mouth daily as needed (repeat kidney stone). )   . traZODone (DESYREL) 50 MG tablet TAKE 1 TABLET BY MOUTH EVERYDAY AT BEDTIME (Patient taking differently: Take 50 mg by mouth at bedtime. )   . triamcinolone cream (KENALOG) 0.1 % Apply 1 application topically 2 (two) times daily. As needed. (Patient taking differently: Apply 1 application topically 2 (two) times daily as needed (irritation). )   . pantoprazole (PROTONIX) 40 MG tablet Take 40 mg by mouth daily as needed (acid reflux).     No facility-administered encounter medications on file as of 06/20/2020.     Transportation Needs: No Transportation Needs  . Lack of Transportation (Medical): No  . Lack of Transportation (Non-Medical): No     Physical Activity: Inactive  . Days of Exercise per Week: 0 days  . Minutes of Exercise per Session: 0 min    Current Diagnosis/Assessment:  Goals Addressed            This Visit's Progress   . Chronic Care Management       CARE PLAN ENTRY  Current Barriers:  . Chronic Disease Management support, education, and care coordination needs related to Hyperlipidemia, Diabetes, and Atrial Fibrillation    Hyperlipidemia Lab Results  Component Value Date/Time   LDLCALC 37 04/25/2020 10:52 AM    . Pharmacist Clinical Goal(s): o Over the next 30 days, patient will work with PharmD and providers to maintain LDL goal < 100 . Current regimen:  o Atorvastatin 80 mg 1 tablet once daily . Interventions: o Discussed low cholesterol diet and exercising as tolerated extensively . Patient self care activities - Over the next 30 days, patient will: o Continue eating heart healthy diet and weight loss  Diabetes Lab Results  Component Value Date/Time   HGBA1C 6.4 (H) 04/25/2020 10:52 AM   HGBA1C 6.4 (A) 01/21/2020 11:57 AM   HGBA1C 6.7 (H) 07/21/2019 11:58 AM   . Pharmacist  Clinical Goal(s): o Over the next 0 days, patient will work with PharmD and providers to maintain A1c goal <7% . Current regimen:  o Metformin ER 500 mg 1 tablet 3 times daily  . Interventions: o Discussed carbohydrate counting and exercising as tolerated extensively . Patient self care activities - Over the next 30 days, patient will: o Check blood sugar twice daily, document, and provide at future appointments o Contact provider with any episodes of hypoglycemia  Atrial Fibrillation . Pharmacist Clinical Goal(s) o Over the next 30 days, patient will work with PharmD and providers to monitor heart function and electrolytes  . Current regimen:   Tikosyn 500 mg 1 capsule twice daily  Nitrostat 0.4 mg SL 1 tablet under tongue every 5 mins as needed for chest pain  Pradaxa 150 mg 1 capsule by mouth every 12 hrs . Interventions: o Advised to monitor blood pressure and heart rate once a day . Patient self care activities - Over the next 30 days, patient will: o Continue to check blood pressure at least once a day o Continue eating heart healthy diet and weight loss  Medication management . Pharmacist Clinical Goal(s): o Over the next 30 days, patient will work with PharmD and providers to maintain optimal medication adherence . Current pharmacy: [facility] . Interventions o Comprehensive medication  review performed. o Continue current medication management strategy . Patient self care activities - Over the next 30 days, patient will: o Focus on medication adherence by pill organizers o Take medications as prescribed o Report any questions or concerns to PharmD and/or provider(s)  Initial goal documentation        AFIB   Patient is currently rhythm controlled. Rhythm controlled  Patient has failed these meds in past:  Patient is currently controlled on the following medications:   Tikosyn 500 mg 1 capsule twice daily . Magnesium 250 mg 1 tablet in the morning and 400 mg at bedtime . Klor-con 20 meq 1 tablet daily  Nitrostat 0.4 mg SL 1 tablet under tongue every 5 mins as needed for chest pain  Pradaxa 150 mg 1 capsule by mouth every 12 hrs  Patient stable with medications. Patient denies severe bleeding or bruising with pradaxa. Patient reports having resting heart rate at around 50 bpm. Denies ever taking nitrostat. Recent BP OV reading was 150/73 with no BP meds. Advised patient to check blood pressure at home at least once a day to monitor the trend.   Plan  Continue current medications    Hyperlipidemia   Lipid Panel     Component Value Date/Time   CHOL 106 04/25/2020 1052   TRIG 54 04/25/2020 1052   HDL 56 04/25/2020 1052   CHOLHDL 1.9 04/25/2020 1052   CHOLHDL 3 02/08/2019 0806   VLDL 22.0 02/08/2019 0806   LDLCALC 37 04/25/2020 1052   LABVLDL 13 04/25/2020 1052     The ASCVD Risk score (Goff DC Jr., et al., 2013) failed to calculate for the following reasons:   The patient has a prior MI or stroke diagnosis   Patient has failed these meds in past: None Patient is currently controlled on the following medications:  . Atorvastatin 80 mg 1 tablet once daily at bedtime  Patient well controlled. Lipid panel WNL. Patient denies myalgias with atorvastatin. Patient has been eating well and includes fruits and vegetables on his diet. Has not started going  back to walking yet, but encouraged him to take it slow and steady as tolerated.  Plan  Continue current medications and diet   Diabetes   Recent Relevant Labs: Lab Results  Component Value Date/Time   HGBA1C 6.4 (H) 04/25/2020 10:52 AM   HGBA1C 6.4 (A) 01/21/2020 11:57 AM   HGBA1C 6.7 (H) 07/21/2019 11:58 AM   GFR 64.21 07/21/2019 11:58 AM   GFR 69.98 02/08/2019 08:06 AM   MICROALBUR 18.7 (H) 02/08/2019 08:06 AM   MICROALBUR 9.3 (H) 06/25/2018 09:28 AM     Checking BG: 2x per Day  Recent FBG Readings: ~100-110  Patient has failed these meds in past: None Patient is currently controlled on the following medications:  [name] Metformin ER 500 mg 1 tablet 3 times daily   Last diabetic Eye exam:  Lab Results  Component Value Date/Time   HMDIABEYEEXA No Retinopathy 12/27/2019 12:00 AM    Last diabetic Foot exam: No results found for: HMDIABFOOTEX   A1c stable with goal <7%. Advised the patient to continue with weight loss and diet. Patient reports eating carbs in moderation. Patient tolerates metformin without any side effects.  Plan  Continue current medications   GERD   Patient has failed these meds in past: None Patient is currently controlled on the following medications:  . Pantoprazole 40 mg 1 tablet daily PRN  Famotidine 10 mg 1 tablet as needed for heartburn  Per patient, has not had to take pantoprazole for the past month. Advised with the side effects of PPIs in long term use. Patient reports taking famotidine instead when experience heart burn and acid reflux. Patient wants to taper of pantoprazole.  Plan  Continue current medications   Insomnia with anxiety   Patient has failed these meds in past: None Patient is currently controlled on the following medications:  . Paroxetine 20 mg 0.5 tablet daily, may increase to increase to 20 mg daily . Trazodone 50 mg 1 tablet at bedtime  Patient reports trying to keep taking paroxetine to 10 mg once daily.  Patient states that sometimes when he tries to breathe deeply, sometimes it is difficult for him to breathe out. He says that paroxetine helps with this, and sometimes takes 20 mg if needed. Reports trazodone helping with his sleep and denies any side effects associated with it.  Plan  Continue current medications    Lateral epicondylitis   Patient has failed these meds in past: None Patient is currently controlled on the following medications:  [name] Methocarbamol 500 mg 4 times daily as needed for muscle spasms  Patient reports taking this rarely. Advised to stop doing any physical activity when taking methocarbamol as it makes you drowsy and dizzy.  Plan  Continue current medications  COPD   Patient has failed these meds in past: None Patient is currently controlled on the following medications:  . Albuterol HFA 1 puff into the lungs every 6 hrs as needed for wheezing or shortness of breath . Breo Ellipta 200-25 mcg/inh 1 puff into the lungs every day  . Fluticasone nasal spray 2 sprays into both nostrils daily as needed for allergies  Patient reports having his O2sat at 92% when using pulse oximeter. Denies shortness of breath and for the past month only had to use albuterol once.  Plan  Continue current medications    Kidney Stone   Patient has failed these meds in past: None Patient is currently controlled on the following medications:  . Tamsulosin 0.4 mg 1 capsule ery day as needed for kidney stone  Ondansetron ODT 4 mg 1 tablet every 8 hrs  as needed  Denies any incidence of kidney stone for the past year.  Plan  Continue current medications    OTCs/Health Maintenance   Patient is currently controlled on the following medications: . APAP 325 mg 2 tablets every 6 hrs as needed for mild pain . Alphagan P 0.1% 1 drop into both eyes 3 times daily . Vitamin D 2000 units 1 tablet twice daily . Benadryl 25 mg 1-2 tablets at bedtime . Allegra 180 mg 1 tablet daily  as needed . Fish oil 1000 mg 1 capsule twice daily . OneTouch Ultrasoft lancets use twice daily . OneTouch Verio test strips use twice daily . Multivitamin 1 tablet every evening . Triamcinolone 5.6% 1 application twice daily as needed  Advised the patient to finish off Vitamin D 2000 units twice daily and start on 5000 units once daily.   Plan  Recommend switching to Vitamin D 5000 units once daily once stock bottle of 2000 units run out.  Vaccines   Reviewed and discussed patient's vaccination history.    Immunization History  Administered Date(s) Administered  . Fluad Quad(high Dose 65+) 10/01/2019  . Influenza Split 10/09/2011, 10/05/2012  . Influenza Whole 10/24/2009, 10/25/2010  . Influenza, High Dose Seasonal PF 08/31/2015, 09/30/2016, 09/30/2017, 10/19/2018  . Influenza,inj,Quad PF,6+ Mos 10/26/2013, 11/08/2014  . PFIZER SARS-COV-2 Vaccination 02/05/2020, 03/01/2020  . Pneumococcal Conjugate-13 05/09/2015  . Pneumococcal Polysaccharide-23 04/24/2009, 06/25/2016  . Td 03/22/2009  . Tdap 04/06/2015  . Zoster 04/29/2012   Patient is getting his second dose of shingrix this coming July. Patient up to date with his vaccines.  Plan  Recommended patient receive shingles vaccine in the office/pharmacy.    Medication Management   [facility]efits[facility] Adherence: Pill organizers Pt endorses 100% compliance  Patient is using [facility] for his pradaxa which is a tier 3 medication. Other medications are filled with [facility].   Plan  Continue current medication management strategy   Follow up: 1 month phone visit   Geraldine Contras, PharmD Clinical Pharmacist [facility] [place] [phone]

## 2020-06-15 ENCOUNTER — Ambulatory Visit (INDEPENDENT_AMBULATORY_CARE_PROVIDER_SITE_OTHER): Payer: Medicare Other | Admitting: Cardiothoracic Surgery

## 2020-06-15 ENCOUNTER — Ambulatory Visit
Admission: RE | Admit: 2020-06-15 | Discharge: 2020-06-15 | Disposition: A | Discharge status: Home / Self Care | Payer: Medicare Other | Source: Ambulatory Visit | Attending: Cardiothoracic Surgery | Admitting: Cardiothoracic Surgery

## 2020-06-15 ENCOUNTER — Other Ambulatory Visit: Payer: Self-pay

## 2020-06-15 VITALS — BP 150/73 | HR 60 | Temp 97.7°F | Resp 20 | Ht 73.0 in | Wt 283.0 lb

## 2020-06-15 DIAGNOSIS — I712 Thoracic aortic aneurysm, without rupture, unspecified: Secondary | ICD-10-CM

## 2020-06-15 MED ORDER — IOPAMIDOL (ISOVUE-370) INJECTION 76%
75.0000 mL | Freq: Once | INTRAVENOUS | Status: AC | PRN
  Administered 2020-06-15: 75 mL via INTRAVENOUS

## 2020-06-15 NOTE — Progress Notes (Signed)
[address]       [place] [address]             [phone]      [name]. Angelica Record #[id number] Date of Birth: 12/30/1951  Referring: Aline August, MD Primary Care: Burnis Medin, MD Primary Cardiologist: [name], MD   Chief Complaint:   POST OP FOLLOW UP OPERATIVE REPORT DATE OF PROCEDURE:  08/31/2019 PREOPERATIVE DIAGNOSIS:  Infected left sternoclavicular joint. POSTOPERATIVE DIAGNOSIS:  Infected left sternoclavicular joint. SURGICAL PROCEDURE:  Incision and drainage and debridement of left sternoclavicular joint with application of wound VAC.  History of Present Illness:     Patient returns to the office today after prolonged hospitalization and time in rehab when he presented with underlying sepsis infected sternoclavicular joint which was debrided on August 31 2019  Patient continues to improve,  returning to near normal activities.  Patient's had no recurrent infections he comes in today with a follow-up CTA of the chest because of the incidental finding of a mildly dilated aorta during his hospitalization September 2020.  He has been followed by pulmonary for a right middle lobe lung nodule, CT scan done in February 2021 showed no change  Past Medical History:  Diagnosis Date  . Atrial fibrillation ([facility]) 03/22/2009   a. s/p multiple DCCV; b. no coumadin due to low TE risk profile; c. Tikosyn Rx  . Coronary atherosclerosis of native coronary artery 11/2002   a. s/p stent to LAD 12/03; OM2 occluded at cath 12/03; d. myoview 5/10: no ischemia;  e. echo 7/11: EF 55%, BAE, mild RVE, PASP 41-45; Myoview was in March 2013. There was no ischemia or infarction, EF 51%   . Cutaneous abscess of back excluding buttocks 07/04/2014   Appears to stem from possibly a cyst very large area 6 cm contact surgeon office   . Diabetes mellitus without complication ([facility])   . Drusen body    see opth note  . ERECTILE DYSFUNCTION  03/22/2009  . GERD 03/22/2009  . HYPERGLYCEMIA 04/25/2010  . HYPERLIPIDEMIA 03/22/2009  . 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 LATERAL EPICONDYLITIS, LEFT 10/24/2009  . LIVER FUNCTION TESTS, ABNORMAL 04/25/2010  . Local reaction to immunization 05/05/2012   minor resolving  zostavax   . Myocardial infarction ([facility]) mi2003  . Numbness in left leg    foot related to back disease and surgery  . Obesity, unspecified 04/24/2009  . Perforated appendicitis with necrosis s/p open appendectomy 06/07/14 06/04/2014  . Renal cyst    Characterized by MRI as simple  . Ruptured suppurative appendicitis    2015   . SLEEP APNEA, OBSTRUCTIVE 03/22/2009   compliant with CPAP  . THROMBOCYTOPENIA 08/16/2010  . TOBACCO USE, QUIT 10/24/2009  . 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 Umbilical hernia      Social History   Tobacco Use  Smoking Status Former Smoker  . Packs/day: 2.00  . Years: 42.00  . Pack years: 84.00  . Types: Cigarettes  . Quit date: 12/30/2008  . Years since quitting: 11.4  Smokeless Tobacco Never Used  Tobacco Comment   started at age 21; 1-2 ppd; quit 2010    Social History   Substance and Sexual Activity  Alcohol Use No  . Alcohol/week: 0.0 standard drinks   Comment: rarely; maybe 1 beer a year     Allergies  Allergen Reactions  . Pseudoephedrine Other (  See Comments)    Patient went into afib  . Diltiazem Rash and Itching    Also 2019  . Novocain [Procaine] Hives    Dentist appointment in 1958; since then has tolerated lidocaine and provocaine with no hives or difficulty.  . Quinolones     Patient was warned about not using Cipro and similar antibiotics. Recent studies have raised concern that fluoroquinolone antibiotics could be associated with an increased risk of aortic aneurysm Fluoroquinolones have non-antimicrobial properties that might jeopardise the integrity of the extracellular matrix of the vascular wall In a  propensity score  matched cohort study in [place], there was a 66% increased rate of aortic aneurysm or dissection associated with oral fluoroquinolone use, compared wit  . Sulfonamide Derivatives Other (See Comments)    Childhood reaction     Current Outpatient Medications  Medication Sig Dispense Refill  . acetaminophen (TYLENOL) 325 MG tablet Take 650 mg by mouth every 6 (six) hours as needed for mild pain.    [name] albuterol (PROVENTIL HFA;VENTOLIN HFA) 108 (90 Base) MCG/ACT inhaler Inhale 2 puffs into the lungs every 6 (six) hours as needed for wheezing or shortness of breath. (Patient taking differently: Inhale 1 puff into the lungs every 6 (six) hours as needed for wheezing or shortness of breath. ) 1 Inhaler 0  . ALPHAGAN P 0.1 % SOLN Place 1 drop into both eyes 3 (three) times daily.   3  . atorvastatin (LIPITOR) 80 MG tablet TAKE 1 TABLET BY MOUTH  DAILY (Patient taking differently: Take 80 mg by mouth daily. ) 90 tablet 3  . BREO ELLIPTA 200-25 MCG/INH AEPB INHALE 1 PUFF INTO THE LUNGS EVERY DAY (Patient taking differently: Inhale 1 puff into the lungs daily. ) 60 each 2  . Cholecalciferol (VITAMIN D) 50 MCG (2000 UT) tablet Take 2,000 Units by mouth 2 (two) times daily.    . diphenhydrAMINE (BENADRYL) 25 MG tablet Take 25-50 mg by mouth at bedtime as needed for sleep.     [name] dofetilide (TIKOSYN) 500 MCG capsule TAKE 1 CAPSULE BY MOUTH  TWICE DAILY (Patient taking differently: Take 500 mcg by mouth 2 (two) times daily. ) 180 capsule 3  . famotidine (PEPCID) 10 MG tablet Take 10 mg by mouth as needed for heartburn.     . fexofenadine (ALLEGRA) 180 MG tablet Take 180 mg by mouth daily as needed for allergies.     . fish oil-omega-3 fatty acids 1000 MG capsule Take 1 g by mouth 2 (two) times daily.     . fluticasone (FLONASE) 50 MCG/ACT nasal spray Place 2 sprays into both nostrils daily as needed for allergies. (Patient taking differently: Place 1 spray into both nostrils daily. ) 48 g prn  . Lancets (ONETOUCH  ULTRASOFT) lancets Twice daily 100 each 6  . Magnesium 250 MG TABS Take 250 mg by mouth daily.    . magnesium oxide (MAG-OX) 400 (241.3 Mg) MG tablet Take 1 tablet (400 mg total) by mouth daily. (Patient taking differently: Take 400 mg by mouth at bedtime. ) 30 tablet 0  . metFORMIN (GLUCOPHAGE-XR) 500 MG 24 hr tablet Take 1 tablet (500 mg total) by mouth 3 (three) times daily.    . methocarbamol (ROBAXIN) 500 MG tablet TAKE 1 TABLET BY MOUTH 4 TIMES A DAY AS NEEDED FOR MUSCLE SPASM (Patient taking differently: Take 500 mg by mouth 4 (four) times daily as needed for muscle spasms. ) 120 tablet 2  . MULTIPLE VITAMIN PO Take 1 tablet  by mouth every evening.     . nitroGLYCERIN (NITROSTAT) 0.4 MG SL tablet Place 1 tablet (0.4 mg total) under the tongue every 5 (five) minutes as needed for chest pain. 25 tablet 12  . ondansetron (ZOFRAN ODT) 4 MG disintegrating tablet Take 1 tablet (4 mg total) by mouth every 8 (eight) hours as needed for nausea or vomiting. 24 tablet 0  . ONETOUCH VERIO test strip USE AS DIRECTED TWICE A DAY 50 strip 12  . pantoprazole (PROTONIX) 40 MG tablet Take 40 mg by mouth daily as needed (acid reflux).     [name] PARoxetine (PAXIL) 20 MG tablet Take 0.5 tablets (10 mg total) by mouth daily. With option to increae to 20 mg per day (Patient taking differently: Take 10 mg by mouth daily. ) 90 tablet 1  . potassium chloride SA (KLOR-CON M20) 20 MEQ tablet Take 1 tablet (20 mEq total) by mouth 2 (two) times daily. (Patient taking differently: Take 20 mEq by mouth daily. ) 60 tablet 0  . PRADAXA 150 MG CAPS capsule TAKE 1 CAPSULE BY MOUTH  EVERY 12 HOURS (Patient taking differently: Take 150 mg by mouth every 12 (twelve) hours. ) 180 capsule 3  . tamsulosin (FLOMAX) 0.4 MG CAPS capsule TAKE 1 CAPSULE BY MOUTH EVERY DAY (Patient taking differently: Take 0.4 mg by mouth daily as needed (repeat kidney stone). ) 30 capsule 5  . traZODone (DESYREL) 50 MG tablet TAKE 1 TABLET BY MOUTH EVERYDAY AT  BEDTIME (Patient taking differently: Take 50 mg by mouth at bedtime. ) 90 tablet 2  . triamcinolone cream (KENALOG) 0.1 % Apply 1 application topically 2 (two) times daily. As needed. (Patient taking differently: Apply 1 application topically 2 (two) times daily as needed (irritation). ) 30 g 0   No current facility-administered medications for this visit.       Physical Exam: BP (!) 150/73   Pulse 60   Temp 97.7 F (36.5 C) (Skin)   Resp 20   Ht 6\' 1"  (1.854 m)   Wt 283 lb (128.4 kg)   SpO2 92% Comment: RA  BMI 37.34 kg/m  General appearance: alert, cooperative and no distress Head: Normocephalic, without obvious abnormality, atraumatic Neck: no adenopathy, no carotid bruit, no JVD, supple, symmetrical, trachea midline and thyroid not enlarged, symmetric, no tenderness/mass/nodules Lymph nodes: Cervical, supraclavicular, and axillary nodes normal. Resp: clear to auscultation bilaterally Cardio: regular rate and rhythm, S1, S2 normal, no murmur, click, rub or gallop GI: soft, non-tender; bowel sounds normal; no masses,  no organomegaly Extremities: extremities normal, atraumatic, no cyanosis or edema and Homans sign is negative, no sign of DVT Neurologic: Grossly normal The site at the left sternoclavicular joint is now completely healed   diagnostic Studies & Laboratory data:     Recent Radiology Findings:   CT ANGIO CHEST AORTA W/CM & OR WO/CM  Result Date: 06/15/2020 CLINICAL DATA:  Thoracic aortic aneurysm, follow-up EXAM: CT ANGIOGRAPHY CHEST WITH CONTRAST TECHNIQUE: Multidetector CT imaging of the chest was performed using the standard protocol during bolus administration of intravenous contrast. Multiplanar CT image reconstructions and MIPs were obtained to evaluate the vascular anatomy. CONTRAST:  6mL ISOVUE-370 IOPAMIDOL (ISOVUE-370) INJECTION 76% COMPARISON:  02/21/2020 and previous FINDINGS: Cardiovascular: Heart size normal. No pericardial effusion. Incomplete  opacification of the pulmonary arterial tree ; the exam was not optimized for detection of pulmonary emboli. 3-vessel coronary calcifications. Good contrast opacification of the thoracic aorta. Aortic Root: --Valve: 2.8 cm --Sinuses: 3.8 cm --Sinotubular Junction: 3.3  cm Limitations by motion: Moderate Thoracic Aorta: --Ascending Aorta: 4.4 cm (stable) --Aortic Arch: 3.5 cm --Descending Aorta: 3 cm Classic 3 vessel brachiocephalic arterial origin anatomy without proximal stenosis. No dissection or stenosis. Calcified atheromatous plaque in the distal arch, isthmus, and proximal descending thoracic segment. Visualized proximal abdominal aorta unremarkable. Mediastinum/Nodes: No hilar or mediastinal adenopathy.  No hematoma. Lungs/Pleura: No pleural effusion. No pneumothorax. Stable 1.7 cm pleural-based right middle lobe nodule . Pulmonary emphysema. No new nodule or infiltrate. Upper Abdomen: No acute findings. Musculoskeletal: Anterior vertebral endplate spurring at multiple levels in the lower thoracic spine. No fracture or worrisome bone lesion. Review of the MIP images confirms the above findings. IMPRESSION: 1. Stable 4.4 cm ascending thoracic aortic aneurysm. Recommend annual imaging followup by CTA or MRA. This recommendation follows 2010 ACCF/AHA/AATS/ACR/ASA/SCA/SCAI/SIR/STS/SVM Guidelines for the Diagnosis and Management of Patients with Thoracic Aortic Disease. Circulation. 2010; 121: Y248-G500. Aortic aneurysm NOS (ICD10-I71.9) 2. Coronary and Aortic Atherosclerosis (ICD10-I70.0) and Emphysema (ICD10-J43.9). Electronically Signed   By: Lucrezia Europe M.D.   On: 06/15/2020 15:38      Recent Lab Findings: Lab Results  Component Value Date   WBC 7.2 04/25/2020   HGB 14.4 04/25/2020   HCT 43.4 04/25/2020   PLT 199 04/25/2020   GLUCOSE 124 (H) 04/25/2020   CHOL 106 04/25/2020   TRIG 54 04/25/2020   HDL 56 04/25/2020   LDLCALC 37 04/25/2020   ALT 70 (H) 04/25/2020   AST 39 04/25/2020   NA 139  04/25/2020   K 5.2 04/25/2020   CL 104 04/25/2020   CREATININE 1.19 04/25/2020   BUN 24 04/25/2020   CO2 25 04/25/2020   TSH 1.414 08/27/2019   INR 1.2 08/31/2019   HGBA1C 6.4 (H) 04/25/2020      Assessment / Plan:  #1 healed infected left sternoclavicular joint #2 Stable 1.7 cm pleural-based right middle lobe nodule  #3  Pulmonary emphysema.-Noted on CT scan currently compensated #4  mildly dilated ascending aorta is unchanged from previous scan-  4.4 cm   Follow-up CTA of the chest 1 year   Medication Changes: No orders of the defined types were placed in this encounter.     Grace Isaac MD      [address] [place] [address] Office [phone]   Beeper [phone]  06/15/2020 3:57 PM

## 2020-06-20 ENCOUNTER — Ambulatory Visit: Payer: Medicare Other | Admitting: Pharmacist

## 2020-06-20 ENCOUNTER — Other Ambulatory Visit: Payer: Self-pay

## 2020-06-20 DIAGNOSIS — E1159 Type 2 diabetes mellitus with other circulatory complications: Secondary | ICD-10-CM

## 2020-06-20 DIAGNOSIS — I4891 Unspecified atrial fibrillation: Secondary | ICD-10-CM

## 2020-06-20 NOTE — Patient Instructions (Addendum)
Visit Information  Thank you for meeting with me to discuss your medications! I look forward to working with you to achieve your health care goals. Below is a summary of what we talked about during the visit:  Goals Addressed            This Visit's Progress   . Chronic Care Management       CARE PLAN ENTRY  Current Barriers:  . Chronic Disease Management support, education, and care coordination needs related to Hyperlipidemia, Diabetes, and Atrial Fibrillation    Hyperlipidemia Lab Results  Component Value Date/Time   LDLCALC 37 04/25/2020 10:52 AM   . Pharmacist Clinical Goal(s): o Over the next 30 days, patient will work with PharmD and providers to maintain LDL goal < 100 . Current regimen:  o Atorvastatin 80 mg 1 tablet once daily . Interventions: o Discussed low cholesterol diet and exercising as tolerated extensively . Patient self care activities - Over the next 30 days, patient will: o Continue eating heart healthy diet and weight loss  Diabetes Lab Results  Component Value Date/Time   HGBA1C 6.4 (H) 04/25/2020 10:52 AM   HGBA1C 6.4 (A) 01/21/2020 11:57 AM   HGBA1C 6.7 (H) 07/21/2019 11:58 AM   . Pharmacist Clinical Goal(s): o Over the next 0 days, patient will work with PharmD and providers to maintain A1c goal <7% . Current regimen:  o Metformin ER 500 mg 1 tablet 3 times daily  . Interventions: o Discussed carbohydrate counting and exercising as tolerated extensively . Patient self care activities - Over the next 30 days, patient will: o Check blood sugar twice daily, document, and provide at future appointments o Contact provider with any episodes of hypoglycemia  Atrial Fibrillation . Pharmacist Clinical Goal(s) o Over the next 30 days, patient will work with PharmD and providers to monitor heart function and electrolytes  . Current regimen:   Tikosyn 500 mg 1 capsule twice daily  Nitrostat 0.4 mg SL 1 tablet under tongue every 5 mins as needed  for chest pain  Pradaxa 150 mg 1 capsule by mouth every 12 hrs . Interventions: o Advised to monitor blood pressure and heart rate once a day . Patient self care activities - Over the next 30 days, patient will: o Continue to check blood pressure at least once a day o Continue eating heart healthy diet and weight loss  Medication management . Pharmacist Clinical Goal(s): o Over the next 30 days, patient will work with PharmD and providers to maintain optimal medication adherence . Current pharmacy: [facility] . Interventions o Comprehensive medication review performed. o Continue current medication management strategy . Patient self care activities - Over the next 30 days, patient will: o Focus on medication adherence by pill organizers o Take medications as prescribed o Report any questions or concerns to PharmD and/or provider(s)  Initial goal documentation        Mr. Mcenery was given information about Chronic Care Management services today including:  1. CCM service includes personalized support from designated clinical staff supervised by his physician, including individualized plan of care and coordination with other care providers 2. 24/7 contact phone numbers for assistance for urgent and routine care needs. 3. Standard insurance, coinsurance, copays and deductibles apply for chronic care management only during months in which we provide at least 20 minutes of these services. Most insurances cover these services at 100%, however patients may be responsible for any copay, coinsurance and/or deductible if applicable. This service may help  you avoid the need for more expensive face-to-face services. 4. Only one practitioner may furnish and bill the service in a calendar month. 5. The patient may stop CCM services at any time (effective at the end of the month) by phone call to the office staff.  Patient agreed to services and verbal consent obtained.   The patient verbalized  understanding of instructions provided today and agreed to receive a mailed copy of patient instruction and/or educational materials. Telephone follow up appointment with pharmacy team member scheduled for: 07/25/20 at 4 PM  Geraldine Contras, PharmD Clinical Pharmacist [facility]   [phone]  Cholesterol Content in Foods Cholesterol is a waxy, fat-like substance that helps to carry fat in the blood. The body needs cholesterol in small amounts, but too much cholesterol can cause damage to the arteries and heart. Most people should eat less than 200 milligrams (mg) of cholesterol a day. Foods with cholesterol  Cholesterol is found in animal-based foods, such as meat, seafood, and dairy. Generally, low-fat dairy and lean meats have less cholesterol than full-fat dairy and fatty meats. The milligrams of cholesterol per serving (mg per serving) of common cholesterol-containing foods are listed below. Meat and other proteins  Egg -- one large whole egg has 186 mg.  Veal shank -- 4 oz has 141 mg.  Lean ground [place] (93% lean) -- 4 oz has 118 mg.  Fat-trimmed lamb loin -- 4 oz has 106 mg.  Lean ground beef (90% lean) -- 4 oz has 100 mg.  Lobster -- 3.5 oz has 90 mg.  Pork loin chops -- 4 oz has 86 mg.  Canned salmon -- 3.5 oz has 83 mg.  Fat-trimmed beef top loin -- 4 oz has 78 mg.  Frankfurter -- 1 frank (3.5 oz) has 77 mg.  Crab -- 3.5 oz has 71 mg.  Roasted chicken without skin, white meat -- 4 oz has 66 mg.  Light bologna -- 2 oz has 45 mg.  Deli-cut [place] -- 2 oz has 31 mg.  Canned tuna -- 3.5 oz has 31 mg.  Berniece Salines -- 1 oz has 29 mg.  Oysters and mussels (raw) -- 3.5 oz has 25 mg.  Mackerel -- 1 oz has 22 mg.  Trout -- 1 oz has 20 mg.  Pork sausage -- 1 link (1 oz) has 17 mg.  Salmon -- 1 oz has 16 mg.  Tilapia -- 1 oz has 14 mg. Dairy  Soft-serve ice cream --  cup (4 oz) has 103 mg.  Whole-milk yogurt -- 1 cup (8 oz) has 29  mg.  Cheddar cheese -- 1 oz has 28 mg.  American cheese -- 1 oz has 28 mg.  Whole milk -- 1 cup (8 oz) has 23 mg.  2% milk -- 1 cup (8 oz) has 18 mg.  Cream cheese -- 1 tablespoon (Tbsp) has 15 mg.  Cottage cheese --  cup (4 oz) has 14 mg.  Low-fat (1%) milk -- 1 cup (8 oz) has 10 mg.  Sour cream -- 1 Tbsp has 8.5 mg.  Low-fat yogurt -- 1 cup (8 oz) has 8 mg.  Nonfat Greek yogurt -- 1 cup (8 oz) has 7 mg.  Half-and-half cream -- 1 Tbsp has 5 mg. Fats and oils  Cod liver oil -- 1 tablespoon (Tbsp) has 82 mg.  Butter -- 1 Tbsp has 15 mg.  Lard -- 1 Tbsp has 14 mg.  Bacon grease -- 1 Tbsp has 14 mg.  Mayonnaise -- 1 Tbsp has 5-10 mg.  Margarine -- 1 Tbsp has 3-10 mg. Exact amounts of cholesterol in these foods may vary depending on specific ingredients and brands. Foods without cholesterol Most plant-based foods do not have cholesterol unless you combine them with a food that has cholesterol. Foods without cholesterol include:  Grains and cereals.  Vegetables.  Fruits.  Vegetable oils, such as olive, canola, and sunflower oil.  Legumes, such as peas, beans, and lentils.  Nuts and seeds.  Egg whites. Summary  The body needs cholesterol in small amounts, but too much cholesterol can cause damage to the arteries and heart.  Most people should eat less than 200 milligrams (mg) of cholesterol a day. This information is not intended to replace advice given to you by your health care provider. Make sure you discuss any questions you have with your health care provider. Document Revised: 11/28/2017 Document Reviewed: 08/12/2017 Elsevier Patient Education  [place].

## 2020-06-27 ENCOUNTER — Other Ambulatory Visit: Payer: Self-pay | Admitting: Internal Medicine

## 2020-06-27 NOTE — Telephone Encounter (Signed)
Last filled by Reesa Chew PA-C  OK to fill? Patient is due for a ROV

## 2020-06-28 NOTE — Telephone Encounter (Signed)
Ok to refill  He was in rehab when ordered   His last labs were April 2021  So he is utd and has updoming appt so rx for 90 days   .

## 2020-07-17 ENCOUNTER — Other Ambulatory Visit: Payer: Self-pay | Admitting: Internal Medicine

## 2020-07-20 ENCOUNTER — Other Ambulatory Visit: Payer: Self-pay | Admitting: Internal Medicine

## 2020-07-20 NOTE — Progress Notes (Signed)
Chief Complaint  Patient presents with  . Annual Exam    Doing well  . Medication Management  . Diabetes    HPI: [name] [name]. 69 y.o. comes in today for Preventive Medicare exam/ wellness visit .Since last visit.  Dm  Doing ok had a1c in April  No new ss VASC BP TAA followed  Emphysema resp the same  Sometimes gets sob and uncertain if panic   Pulse ox in the 90s   sometimes pulse in 40 s Some days takes  Extra half of the paxil  PAF no change Vision the same hearing never that great but ok  Needs refill trazodone for sleep   Health Maintenance  Topic Date Due  . URINE MICROALBUMIN  02/09/2020  . INFLUENZA VACCINE  07/30/2020  . HEMOGLOBIN A1C  10/25/2020  . OPHTHALMOLOGY EXAM  12/26/2020  . FOOT EXAM  07/21/2021  . TETANUS/TDAP  04/05/2025  . COLONOSCOPY  05/26/2030  . COVID-19 Vaccine  Completed  . Hepatitis C Screening  Completed  . PNA vac Low Risk Adult  Completed   Health Maintenance Review LIFESTYLE:  Exercise:  Back up to over a mile per day.   paroxitine helps some   1/2 tab for anxiety  Tobacco/ETS:n Alcohol: n Sugar beverages: ocass lemonade  Sleep:OSA   On vacation  Slept 7 hour  Not as much  Drug use: no HH: 2    Hearing:  No change hard of hearing .  Hard to lip read  With masks   Vision:  Has a clind spot   Seeing  Every 6 months  For Last eye check UTD  Safety:  Has smoke detector and wears seat belts.   No excess sun exposure. Sees dentist regularly.  Falls:  no  Memory: Felt to be good  , no concern from her or her family.  Depression: No anhedonia unusual crying or depressive symptoms  Nutrition: Eats well balanced diet; adequate calcium and vitamin D. No swallowing chewing problems.  Injury: no major injuries in the last six months.  Other healthcare providers:  Reviewed today .  Preventive parameters: up-to-date  Reviewed   ADLS:   There are no problems or need for assistance  driving, feeding, obtaining food, dressing,  toileting and bathing, managing money using phone.  is independent.  ROS:  GEN/ HEENT: No fever, significant weight changes sweats headaches vision problems hearing changes, CV/ PULM; No chest pain shortness of breath cough, syncope,edema  change in exercise tolerance. GI /GU: No adominal pain, vomiting, change in bowel habits. No blood in the stool. No significant GU symptoms. SKIN/HEME: ,no acute skin rashes suspicious lesions or bleeding. No lymphadenopathy, nodules, masses.  NEURO/ PSYCH:  No neurologic signs such as weakness numbness. No depression anxiety. IMM/ Allergy: No unusual infections.  Allergy .   REST of 12 system review negative except as per HPI   Past Medical History:  Diagnosis Date  . Atrial fibrillation ([place]) 03/22/2009   a. s/p multiple DCCV; b. no coumadin due to low TE risk profile; c. Tikosyn Rx  . Coronary atherosclerosis of native coronary artery 11/2002   a. s/p stent to LAD 12/03; OM2 occluded at cath 12/03; d. myoview 5/10: no ischemia;  e. echo 7/11: EF 55%, BAE, mild RVE, PASP 41-45; Myoview was in March 2013. There was no ischemia or infarction, EF 51%   . Cutaneous abscess of back excluding buttocks 07/04/2014   Appears to stem from possibly a cyst very large  area 6 cm contact surgeon office   . Diabetes mellitus without complication ([place])   . Drusen body    see opth note  . ERECTILE DYSFUNCTION 03/22/2009  . GERD 03/22/2009  . HYPERGLYCEMIA 04/25/2010  . HYPERLIPIDEMIA 03/22/2009  . 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 LATERAL EPICONDYLITIS, LEFT 10/24/2009  . LIVER FUNCTION TESTS, ABNORMAL 04/25/2010  . Local reaction to immunization 05/05/2012   minor resolving  zostavax   . Myocardial infarction ([place]) mi2003  . Numbness in left leg    foot related to back disease and surgery  . Obesity, unspecified 04/24/2009  . Perforated appendicitis with necrosis s/p open appendectomy 06/07/14 06/04/2014  . Renal cyst    Characterized  by MRI as simple  . Ruptured suppurative appendicitis    2015   . SLEEP APNEA, OBSTRUCTIVE 03/22/2009   compliant with CPAP  . THROMBOCYTOPENIA 08/16/2010  . TOBACCO USE, QUIT 10/24/2009  . 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 Umbilical hernia     Family History  Problem Relation Age of Onset  . Thyroid disease Mother   . Ovarian cancer Mother   . Breast cancer Mother   . Lung cancer Father   . Cancer Father     Social History   Socioeconomic History  . Marital status: Married    Spouse name: Not on file  . Number of children: Not on file  . Years of education: Not on file  . Highest education level: Not on file  Occupational History  . Not on file  Tobacco Use  . Smoking status: Former Smoker    Packs/day: 2.00    Years: 42.00    Pack years: 84.00    Types: Cigarettes    Quit date: 12/30/2008    Years since quitting: 11.5  . Smokeless tobacco: Never Used  . Tobacco comment: started at age 79; 1-2 ppd; quit 2010  Vaping Use  . Vaping Use: Never used  Substance and Sexual Activity  . Alcohol use: No    Alcohol/week: 0.0 standard drinks    Comment: rarely; maybe 1 beer a year  . Drug use: No  . Sexual activity: Yes  Other Topics Concern  . Not on file  Social History Narrative   Retired paramedic   Regular exercise-yes not recently    Has children   Wife is overweight and has fibromyalgia and depression on disability doesn't go out much. Back surgery    Has older dog   Retired from [employer] 31 years of service now 7 years    Caffeine- minimal   Social Determinants of Health   Financial Resource Strain:   . Difficulty of Paying Living Expenses:   Food Insecurity:   . Worried About Charity fundraiser in the Last Year:   . Arboriculturist in the Last Year:   Transportation Needs: No Transportation Needs  . Lack of Transportation (Medical): No  . Lack of Transportation (Non-Medical): No  Physical Activity: Inactive  . Days of Exercise per Week: 0 days  .  Minutes of Exercise per Session: 0 min  Stress:   . Feeling of Stress :   Social Connections:   . Frequency of Communication with Friends and Family:   . Frequency of Social Gatherings with Friends and Family:   . Attends Religious Services:   . Active Member of Clubs or Organizations:   . Attends Archivist Meetings:   [name] Marital  Status:     Outpatient Encounter Medications as of 07/21/2020  Medication Sig  . acetaminophen (TYLENOL) 325 MG tablet Take 650 mg by mouth every 6 (six) hours as needed for mild pain.  [name] albuterol (PROVENTIL HFA;VENTOLIN HFA) 108 (90 Base) MCG/ACT inhaler Inhale 2 puffs into the lungs every 6 (six) hours as needed for wheezing or shortness of breath. (Patient taking differently: Inhale 1 puff into the lungs every 6 (six) hours as needed for wheezing or shortness of breath. )  . ALPHAGAN P 0.1 % SOLN Place 1 drop into both eyes 3 (three) times daily.   [name] atorvastatin (LIPITOR) 80 MG tablet TAKE 1 TABLET BY MOUTH  DAILY (Patient taking differently: Take 80 mg by mouth daily. )  . BREO ELLIPTA 200-25 MCG/INH AEPB INHALE 1 PUFF INTO THE LUNGS EVERY DAY (Patient taking differently: Inhale 1 puff into the lungs daily. )  . brimonidine (ALPHAGAN) 0.15 % ophthalmic solution 1 drop 3 (three) times daily.  . Cholecalciferol (VITAMIN D) 50 MCG (2000 UT) tablet Take 2,000 Units by mouth 2 (two) times daily.  . diphenhydrAMINE (BENADRYL) 25 MG tablet Take 25-50 mg by mouth at bedtime as needed for sleep.   [name] dofetilide (TIKOSYN) 500 MCG capsule TAKE 1 CAPSULE BY MOUTH  TWICE DAILY (Patient taking differently: Take 500 mcg by mouth 2 (two) times daily. )  . famotidine (PEPCID) 10 MG tablet Take 10 mg by mouth as needed for heartburn.   . fexofenadine (ALLEGRA) 180 MG tablet Take 180 mg by mouth daily as needed for allergies.   . fish oil-omega-3 fatty acids 1000 MG capsule Take 1 g by mouth 2 (two) times daily.   . fluticasone (FLONASE) 50 MCG/ACT nasal spray SPRAY 2  SPRAYS IN EACH NOSTRIL DAILY AS NEEDED FOR ALLERGIES  . Lancets (ONETOUCH ULTRASOFT) lancets Twice daily  . Magnesium 250 MG TABS Take 250 mg by mouth daily. Patient takes 250 mg AM and 400 mg PM  . magnesium oxide (MAG-OX) 400 (241.3 Mg) MG tablet Take 1 tablet (400 mg total) by mouth daily. (Patient taking differently: Take 400 mg by mouth at bedtime. )  . metFORMIN (GLUCOPHAGE-XR) 500 MG 24 hr tablet Take 1 tablet (500 mg total) by mouth in the morning, at noon, and at bedtime.  . methocarbamol (ROBAXIN) 500 MG tablet TAKE 1 TABLET BY MOUTH 4 TIMES A DAY AS NEEDED FOR MUSCLE SPASM (Patient taking differently: Take 500 mg by mouth 4 (four) times daily as needed for muscle spasms. )  . MULTIPLE VITAMIN PO Take 1 tablet by mouth every evening.   . nitroGLYCERIN (NITROSTAT) 0.4 MG SL tablet Place 1 tablet (0.4 mg total) under the tongue every 5 (five) minutes as needed for chest pain.  [name] ondansetron (ZOFRAN ODT) 4 MG disintegrating tablet Take 1 tablet (4 mg total) by mouth every 8 (eight) hours as needed for nausea or vomiting.  [name] VERIO test strip USE AS DIRECTED TWICE A DAY  . pantoprazole (PROTONIX) 40 MG tablet Take 40 mg by mouth daily as needed (acid reflux).   [name] PARoxetine (PAXIL) 20 MG tablet TAKE 1/2 TABLET EVERY DAY. WITH OPTION TO INCREASE TO 1 TABLET DAILY  . potassium chloride SA (KLOR-CON M20) 20 MEQ tablet Take 1 tablet (20 mEq total) by mouth 2 (two) times daily. (Patient taking differently: Take 20 mEq by mouth daily. )  . PRADAXA 150 MG CAPS capsule TAKE 1 CAPSULE BY MOUTH  EVERY 12 HOURS (Patient taking differently: Take 150  mg by mouth every 12 (twelve) hours. )  . tamsulosin (FLOMAX) 0.4 MG CAPS capsule TAKE 1 CAPSULE BY MOUTH EVERY DAY (Patient taking differently: Take 0.4 mg by mouth daily as needed (repeat kidney stone). )  . traZODone (DESYREL) 50 MG tablet Take 1 tablet (50 mg total) by mouth at bedtime.  . triamcinolone cream (KENALOG) 0.1 % Apply 1 application  topically 2 (two) times daily. As needed. (Patient taking differently: Apply 1 application topically 2 (two) times daily as needed (irritation). )  . [DISCONTINUED] traZODone (DESYREL) 50 MG tablet TAKE 1 TABLET BY MOUTH EVERYDAY AT BEDTIME (Patient taking differently: Take 50 mg by mouth at bedtime. )   No facility-administered encounter medications on file as of 07/21/2020.    EXAM:  BP (!) 140/62   Pulse 56   Temp 97.6 F (36.4 C) (Oral)   Ht 5' 11.5" (1.816 m)   Wt (!) 288 lb (130.6 kg)   SpO2 95%   BMI 39.61 kg/m   Body mass index is 39.61 kg/m.  Physical Exam: Vital signs reviewed ASN:KNLZ is a well-developed well-nourished alert cooperative   who appears stated age in no acute distress.  HEENT: normocephalic atraumatic , Eyes: PERRL EOM's full, conjunctiva clear, Nares: paten,t no deformity discharge or tenderness., Ears: no deformity EAC's nl wax r ear  TMs with normal landmarks. Mouth: masked  NECK: supple without masses, thyromegaly or bruits. CHEST/PULM:  Clear to auscultation and percussion breath sounds equal no wheeze , rales or rhonchi. No chest wall deformities or tenderness. CV: PMI is nondisplaced, S1 S2 no gallops, murmurs, rubs. Peripheral pulses are full without delay.No JVD .  Quiet varicosities  ABDOMEN: Bowel sounds normal nontender  No guard or rebound, no hepato splenomegal no CVA tenderness.   Extremtities:  No clubbing cyanosis or edema, no acute joint swelling or redness no focal atrophy NEURO:  Oriented x3, cranial nerves 3-12 appear to be intact, no obvious focal weakness,gait within normal limits no abnormal reflexes or asymmetrical SKIN: No acute rashes normal turgor, color, no bruising or petechiae. PSYCH: Oriented, good eye contact, no obvious depression anxiety, cognition and judgment appear normal. LN: no cervical axillary inguinal adenopathy No noted deficits in memory, attention, and speech. Diabetic Foot Exam - Simple   Simple Foot  Form Diabetic Foot exam was performed with the following findings: Yes 07/21/2020  9:52 AM  Visual Inspection No deformities, no ulcerations, no other skin breakdown bilaterally: Yes Sensation Testing Intact to touch and monofilament testing bilaterally: Yes Pulse Check Posterior Tibialis and Dorsalis pulse intact bilaterally: Yes Comments      Lab Results  Component Value Date   WBC 7.2 04/25/2020   HGB 14.4 04/25/2020   HCT 43.4 04/25/2020   PLT 199 04/25/2020   GLUCOSE 124 (H) 04/25/2020   CHOL 106 04/25/2020   TRIG 54 04/25/2020   HDL 56 04/25/2020   LDLCALC 37 04/25/2020   ALT 70 (H) 04/25/2020   AST 39 04/25/2020   NA 139 04/25/2020   K 5.2 04/25/2020   CL 104 04/25/2020   CREATININE 1.19 04/25/2020   BUN 24 04/25/2020   CO2 25 04/25/2020   TSH 1.414 08/27/2019   PSA 1.12 07/21/2019   INR 1.2 08/31/2019   HGBA1C 6.4 (H) 04/25/2020   MICROALBUR 18.7 (H) 02/08/2019   Above reviewed  ASSESSMENT AND PLAN:  Discussed the following assessment and plan:  Visit for preventive health examination  Medication management - Plan: Hepatic function panel, Hemoglobin A1c, Microalbumin / creatinine  urine ratio, BASIC METABOLIC PANEL WITH GFR  Type 2 diabetes mellitus with other circulatory complications (HCC) - Plan: Hepatic function panel, Hemoglobin A1c, Microalbumin / creatinine urine ratio, BASIC METABOLIC PANEL WITH GFR  Screening PSA (prostate specific antigen) - Plan: PSA  Abnormal LFTs - Plan: Hepatic function panel, Hemoglobin A1c, Microalbumin / creatinine urine ratio, BASIC METABOLIC PANEL WITH GFR  Chronic anticoagulation - Plan: Hepatic function panel, Hemoglobin A1c, Microalbumin / creatinine urine ratio, BASIC METABOLIC PANEL WITH GFR  Sleep disorder  Anxious mood as adjustment reaction - doing better  paxil lwo dose and inc as indicated  Morbid obesity (HCC)  Obstructive sleep apnea Fu labs  In next 1-2 months  To include fu a1c lfts psa urine micro  albumin  Refill trazodone today  Disc use of meds  When gets panicky sob confirm if actually getting brady cardia or not  Patient Care Team: Antoinetta Berrones, Standley Brooking, MD as PCP - General Stanford Breed, Denice Bors, MD as PCP - Cardiology (Cardiology) Juanita Craver, MD (Gastroenterology) Lelon Perla, MD (Cardiology) Jackolyn Confer, MD as Consulting Physician (General Surgery) Georgeann Oppenheim, MD as Consulting Physician (Ophthalmology) Chesley Mires, MD as Consulting Physician (Pulmonary Disease) Angelia Mould, MD as Consulting Physician (Vascular Surgery) Eli Hose, [facility] as Pharmacist (Pharmacist) Grace Isaac, MD as Consulting Physician (Cardiothoracic Surgery)  Patient Instructions  No change in plans Plan fasting  Bmp, hg a1c  lfts  And urine for protein.  psa .   in  1-2 months  Otherwise  ROV in 6 months .   Health Maintenance After Age 33 After age 65, you are at a higher risk for certain long-term diseases and infections as well as injuries from falls. Falls are a major cause of broken bones and head injuries in people who are older than age 71. Getting regular preventive care can help to keep you healthy and well. Preventive care includes getting regular testing and making lifestyle changes as recommended by your health care provider. Talk with your health care provider about:  Which screenings and tests you should have. A screening is a test that checks for a disease when you have no symptoms.  A diet and exercise plan that is right for you. What should I know about screenings and tests to prevent falls? Screening and testing are the best ways to find a health problem early. Early diagnosis and treatment give you the best chance of managing medical conditions that are common after age 61. Certain conditions and lifestyle choices may make you more likely to have a fall. Your health care provider may recommend:  Regular vision checks. Poor vision and conditions such as  cataracts can make you more likely to have a fall. If you wear glasses, make sure to get your prescription updated if your vision changes.  Medicine review. Work with your health care provider to regularly review all of the medicines you are taking, including over-the-counter medicines. Ask your health care provider about any side effects that may make you more likely to have a fall. Tell your health care provider if any medicines that you take make you feel dizzy or sleepy.  Osteoporosis screening. Osteoporosis is a condition that causes the bones to get weaker. This can make the bones weak and cause them to break more easily.  Blood pressure screening. Blood pressure changes and medicines to control blood pressure can make you feel dizzy.  Strength and balance checks. Your health care provider may recommend certain tests to  check your strength and balance while standing, walking, or changing positions.  Foot health exam. Foot pain and numbness, as well as not wearing proper footwear, can make you more likely to have a fall.  Depression screening. You may be more likely to have a fall if you have a fear of falling, feel emotionally low, or feel unable to do activities that you used to do.  Alcohol use screening. Using too much alcohol can affect your balance and may make you more likely to have a fall. What actions can I take to lower my risk of falls? General instructions  Talk with your health care provider about your risks for falling. Tell your health care provider if: ? You fall. Be sure to tell your health care provider about all falls, even ones that seem minor. ? You feel dizzy, sleepy, or off-balance.  Take over-the-counter and prescription medicines only as told by your health care provider. These include any supplements.  Eat a healthy diet and maintain a healthy weight. A healthy diet includes low-fat dairy products, low-fat (lean) meats, and fiber from whole grains, beans, and  lots of fruits and vegetables. Home safety  Remove any tripping hazards, such as rugs, cords, and clutter.  Install safety equipment such as grab bars in bathrooms and safety rails on stairs.  Keep rooms and walkways well-lit. Activity   Follow a regular exercise program to stay fit. This will help you maintain your balance. Ask your health care provider what types of exercise are appropriate for you.  If you need a cane or walker, use it as recommended by your health care provider.  Wear supportive shoes that have nonskid soles. Lifestyle  Do not drink alcohol if your health care provider tells you not to drink.  If you drink alcohol, limit how much you have: ? 0-1 drink a day for women. ? 0-2 drinks a day for men.  Be aware of how much alcohol is in your drink. In the [place] one drink equals one typical bottle of beer (12 oz), one-half glass of wine (5 oz), or one shot of hard liquor (1 oz).  Do not use any products that contain nicotine or tobacco, such as cigarettes and e-cigarettes. If you need help quitting, ask your health care provider. Summary  Having a healthy lifestyle and getting preventive care can help to protect your health and wellness after age 36.  Screening and testing are the best way to find a health problem early and help you avoid having a fall. Early diagnosis and treatment give you the best chance for managing medical conditions that are more common for people who are older than age 36.  Falls are a major cause of broken bones and head injuries in people who are older than age 6. Take precautions to prevent a fall at home.  Work with your health care provider to learn what changes you can make to improve your health and wellness and to prevent falls. This information is not intended to replace advice given to you by your health care provider. Make sure you discuss any questions you have with your health care provider. Document Revised: 04/08/2019  Document Reviewed: 10/29/2017 Elsevier Patient Education  2020 [place] [name] [name] M.D.

## 2020-07-21 ENCOUNTER — Other Ambulatory Visit: Payer: Self-pay

## 2020-07-21 ENCOUNTER — Ambulatory Visit (INDEPENDENT_AMBULATORY_CARE_PROVIDER_SITE_OTHER): Payer: Medicare Other | Admitting: Internal Medicine

## 2020-07-21 ENCOUNTER — Encounter: Payer: Self-pay | Admitting: Internal Medicine

## 2020-07-21 VITALS — BP 140/62 | HR 56 | Temp 97.6°F | Ht 71.5 in | Wt 288.0 lb

## 2020-07-21 DIAGNOSIS — Z7901 Long term (current) use of anticoagulants: Secondary | ICD-10-CM | POA: Diagnosis not present

## 2020-07-21 DIAGNOSIS — Z79899 Other long term (current) drug therapy: Secondary | ICD-10-CM

## 2020-07-21 DIAGNOSIS — Z0000 Encounter for general adult medical examination without abnormal findings: Secondary | ICD-10-CM

## 2020-07-21 DIAGNOSIS — Z125 Encounter for screening for malignant neoplasm of prostate: Secondary | ICD-10-CM | POA: Diagnosis not present

## 2020-07-21 DIAGNOSIS — F4322 Adjustment disorder with anxiety: Secondary | ICD-10-CM

## 2020-07-21 DIAGNOSIS — G4733 Obstructive sleep apnea (adult) (pediatric): Secondary | ICD-10-CM

## 2020-07-21 DIAGNOSIS — R945 Abnormal results of liver function studies: Secondary | ICD-10-CM | POA: Diagnosis not present

## 2020-07-21 DIAGNOSIS — E1159 Type 2 diabetes mellitus with other circulatory complications: Secondary | ICD-10-CM

## 2020-07-21 DIAGNOSIS — G479 Sleep disorder, unspecified: Secondary | ICD-10-CM

## 2020-07-21 DIAGNOSIS — R7989 Other specified abnormal findings of blood chemistry: Secondary | ICD-10-CM

## 2020-07-21 MED ORDER — TRAZODONE HCL 50 MG PO TABS: 50.0000 mg | ORAL_TABLET | Freq: Every day | ORAL | 3 refills | Status: DC

## 2020-07-21 NOTE — Patient Instructions (Addendum)
No change in plans Plan fasting  Bmp, hg a1c  lfts  And urine for protein.  psa .   in  1-2 months  Otherwise  ROV in 6 months .   Health Maintenance After Age 71 After age 69, you are at a higher risk for certain long-term diseases and infections as well as injuries from falls. Falls are a major cause of broken bones and head injuries in people who are older than age 55. Getting regular preventive care can help to keep you healthy and well. Preventive care includes getting regular testing and making lifestyle changes as recommended by your health care provider. Talk with your health care provider about:  Which screenings and tests you should have. A screening is a test that checks for a disease when you have no symptoms.  A diet and exercise plan that is right for you. What should I know about screenings and tests to prevent falls? Screening and testing are the best ways to find a health problem early. Early diagnosis and treatment give you the best chance of managing medical conditions that are common after age 17. Certain conditions and lifestyle choices may make you more likely to have a fall. Your health care provider may recommend:  Regular vision checks. Poor vision and conditions such as cataracts can make you more likely to have a fall. If you wear glasses, make sure to get your prescription updated if your vision changes.  Medicine review. Work with your health care provider to regularly review all of the medicines you are taking, including over-the-counter medicines. Ask your health care provider about any side effects that may make you more likely to have a fall. Tell your health care provider if any medicines that you take make you feel dizzy or sleepy.  Osteoporosis screening. Osteoporosis is a condition that causes the bones to get weaker. This can make the bones weak and cause them to break more easily.  Blood pressure screening. Blood pressure changes and medicines to control  blood pressure can make you feel dizzy.  Strength and balance checks. Your health care provider may recommend certain tests to check your strength and balance while standing, walking, or changing positions.  Foot health exam. Foot pain and numbness, as well as not wearing proper footwear, can make you more likely to have a fall.  Depression screening. You may be more likely to have a fall if you have a fear of falling, feel emotionally low, or feel unable to do activities that you used to do.  Alcohol use screening. Using too much alcohol can affect your balance and may make you more likely to have a fall. What actions can I take to lower my risk of falls? General instructions  Talk with your health care provider about your risks for falling. Tell your health care provider if: ? You fall. Be sure to tell your health care provider about all falls, even ones that seem minor. ? You feel dizzy, sleepy, or off-balance.  Take over-the-counter and prescription medicines only as told by your health care provider. These include any supplements.  Eat a healthy diet and maintain a healthy weight. A healthy diet includes low-fat dairy products, low-fat (lean) meats, and fiber from whole grains, beans, and lots of fruits and vegetables. Home safety  Remove any tripping hazards, such as rugs, cords, and clutter.  Install safety equipment such as grab bars in bathrooms and safety rails on stairs.  Keep rooms and walkways well-lit. Activity  Follow a regular exercise program to stay fit. This will help you maintain your balance. Ask your health care provider what types of exercise are appropriate for you.  If you need a cane or walker, use it as recommended by your health care provider.  Wear supportive shoes that have nonskid soles. Lifestyle  Do not drink alcohol if your health care provider tells you not to drink.  If you drink alcohol, limit how much you have: ? 0-1 drink a day for  women. ? 0-2 drinks a day for men.  Be aware of how much alcohol is in your drink. In the [place] one drink equals one typical bottle of beer (12 oz), one-half glass of wine (5 oz), or one shot of hard liquor (1 oz).  Do not use any products that contain nicotine or tobacco, such as cigarettes and e-cigarettes. If you need help quitting, ask your health care provider. Summary  Having a healthy lifestyle and getting preventive care can help to protect your health and wellness after age 14.  Screening and testing are the best way to find a health problem early and help you avoid having a fall. Early diagnosis and treatment give you the best chance for managing medical conditions that are more common for people who are older than age 88.  Falls are a major cause of broken bones and head injuries in people who are older than age 57. Take precautions to prevent a fall at home.  Work with your health care provider to learn what changes you can make to improve your health and wellness and to prevent falls. This information is not intended to replace advice given to you by your health care provider. Make sure you discuss any questions you have with your health care provider. Document Revised: 04/08/2019 Document Reviewed: 10/29/2017 Elsevier Patient Education  2020 [employer].

## 2020-07-25 ENCOUNTER — Telehealth: Payer: Medicare Other

## 2020-07-29 ENCOUNTER — Other Ambulatory Visit: Payer: Self-pay | Admitting: Cardiology

## 2020-08-11 NOTE — Telephone Encounter (Signed)
Thanks for the update   [name] can you  Update the dose on his paroxetine  Let me know how doing and fu in  A few months or as needed

## 2020-08-21 ENCOUNTER — Other Ambulatory Visit: Payer: Medicare Other

## 2020-08-25 ENCOUNTER — Other Ambulatory Visit: Payer: Medicare Other

## 2020-08-26 ENCOUNTER — Other Ambulatory Visit: Payer: Self-pay | Admitting: Internal Medicine

## 2020-09-14 ENCOUNTER — Other Ambulatory Visit: Payer: Self-pay

## 2020-09-14 ENCOUNTER — Other Ambulatory Visit (INDEPENDENT_AMBULATORY_CARE_PROVIDER_SITE_OTHER): Payer: Medicare Other

## 2020-09-14 DIAGNOSIS — Z79899 Other long term (current) drug therapy: Secondary | ICD-10-CM

## 2020-09-14 DIAGNOSIS — E1159 Type 2 diabetes mellitus with other circulatory complications: Secondary | ICD-10-CM

## 2020-09-14 DIAGNOSIS — Z125 Encounter for screening for malignant neoplasm of prostate: Secondary | ICD-10-CM

## 2020-09-14 DIAGNOSIS — Z7901 Long term (current) use of anticoagulants: Secondary | ICD-10-CM

## 2020-09-14 DIAGNOSIS — R7989 Other specified abnormal findings of blood chemistry: Secondary | ICD-10-CM

## 2020-09-14 DIAGNOSIS — R790 Abnormal level of blood mineral: Secondary | ICD-10-CM

## 2020-09-14 DIAGNOSIS — R945 Abnormal results of liver function studies: Secondary | ICD-10-CM

## 2020-09-14 NOTE — Addendum Note (Signed)
Addended by: Marrion Coy on: 09/14/2020 08:11 AM   Modules accepted: Orders

## 2020-09-15 LAB — MAGNESIUM: Magnesium: 2.1 mg/dL (ref 1.5–2.5)

## 2020-09-17 LAB — HEMOGLOBIN A1C
Hgb A1c MFr Bld: 6.3 % of total Hgb — ABNORMAL HIGH (ref <5.7)
Mean Plasma Glucose: 134 (calc)
eAG (mmol/L): 7.4 (calc)

## 2020-09-17 LAB — BASIC METABOLIC PANEL WITH GFR
BUN/Creatinine Ratio: 26 (calc) — ABNORMAL HIGH (ref 6–22)
BUN: 32 mg/dL — ABNORMAL HIGH (ref 7–25)
CO2: 28 mmol/L (ref 20–32)
Calcium: 10.1 mg/dL (ref 8.6–10.3)
Chloride: 107 mmol/L (ref 98–110)
Creat: 1.21 mg/dL — ABNORMAL HIGH (ref 0.70–1.18)
GFR, Est African American: 70 mL/min/{1.73_m2} (ref >=60)
GFR, Est Non African American: 60 mL/min/{1.73_m2} (ref >=60)
Glucose, Bld: 132 mg/dL — ABNORMAL HIGH (ref 65–99)
Potassium: 4.9 mmol/L (ref 3.5–5.3)
Sodium: 144 mmol/L (ref 135–146)

## 2020-09-17 LAB — HEPATIC FUNCTION PANEL
AG Ratio: 1.8 (calc) (ref 1.0–2.5)
ALT: 29 U/L (ref 9–46)
AST: 20 U/L (ref 10–35)
Albumin: 4.2 g/dL (ref 3.6–5.1)
Alkaline phosphatase (APISO): 51 U/L (ref 35–144)
Bilirubin, Direct: 0.4 mg/dL — ABNORMAL HIGH (ref 0.0–0.2)
Globulin: 2.3 g/dL (calc) (ref 1.9–3.7)
Indirect Bilirubin: 1.2 mg/dL (calc) (ref 0.2–1.2)
Total Bilirubin: 1.6 mg/dL — ABNORMAL HIGH (ref 0.2–1.2)
Total Protein: 6.5 g/dL (ref 6.1–8.1)

## 2020-09-17 LAB — PSA: PSA: 1.28 ng/mL (ref <=4.0)

## 2020-09-17 LAB — MICROALBUMIN / CREATININE URINE RATIO

## 2020-09-18 NOTE — Progress Notes (Signed)
A1c is very good stable    creatinine upa bit  and we should recheck at next visit  to be sure stable  Rest of out of range   appears clinically insignificant  Forwarding result t o Dr Stanford Breed

## 2020-09-26 ENCOUNTER — Other Ambulatory Visit: Payer: Self-pay | Admitting: Internal Medicine

## 2020-09-29 ENCOUNTER — Telehealth: Payer: Self-pay | Admitting: Internal Medicine

## 2020-09-29 NOTE — Telephone Encounter (Signed)
Pt is calling in stating that he is needing a refill on Rx Pradaxa 150 MG   Pharm: [facility] mail order

## 2020-10-03 ENCOUNTER — Other Ambulatory Visit: Payer: Self-pay

## 2020-10-03 ENCOUNTER — Telehealth: Payer: Self-pay | Admitting: Internal Medicine

## 2020-10-03 NOTE — Telephone Encounter (Signed)
Pt is now taking Paroxetine 20mg  1qd/corrected in chart per PCP/thx dmf

## 2020-10-04 NOTE — Telephone Encounter (Signed)
Last filled by Dr. Yong Channel.  Can you fill?

## 2020-10-04 NOTE — Telephone Encounter (Signed)
MF-I have created order for Pradaxa and pended/this will last till his scheduled appt with Dr. Winfred Burn you please send this to [facility]? Thx dmf

## 2020-10-05 MED ORDER — DABIGATRAN ETEXILATE MESYLATE 150 MG PO CAPS: ORAL_CAPSULE | ORAL | 1 refills | Status: DC

## 2020-10-05 NOTE — Telephone Encounter (Signed)
I sent him a message!

## 2020-10-05 NOTE — Telephone Encounter (Signed)
Sent in electronically .  

## 2020-10-06 ENCOUNTER — Ambulatory Visit (INDEPENDENT_AMBULATORY_CARE_PROVIDER_SITE_OTHER): Payer: Medicare Other

## 2020-10-06 ENCOUNTER — Other Ambulatory Visit: Payer: Self-pay

## 2020-10-06 DIAGNOSIS — Z23 Encounter for immunization: Secondary | ICD-10-CM | POA: Diagnosis not present

## 2020-10-12 ENCOUNTER — Other Ambulatory Visit: Payer: Self-pay | Admitting: Internal Medicine

## 2020-10-13 ENCOUNTER — Ambulatory Visit: Payer: Medicare Other

## 2020-10-13 MED ORDER — PAROXETINE HCL 20 MG PO TABS: ORAL_TABLET | ORAL | 1 refills | Status: DC

## 2020-10-13 NOTE — Addendum Note (Signed)
Addended by: Rodrigo Ran on: 10/13/2020 08:11 AM   Modules accepted: Orders

## 2020-10-13 NOTE — Addendum Note (Signed)
Addended by: Rodrigo Ran on: 10/13/2020 04:44 PM   Modules accepted: Orders

## 2020-10-17 NOTE — Progress Notes (Signed)
HPI: FU hx of CAD s/p stent to LAD in 2003, parox AFib, HL, OSA. He has seen Dr. Rayann Heman and ablation could be considered if he lost weight. Previous rash with cardizem; on tikosyn. Has had previous cardioversions for atrial fibrillation. Carotid Dopplers June 2019 showed 1 to 39% bilateral stenosis. Echocardiogram August 2020 showed normal LV function, mild left atrial enlargement and trace aortic insufficiency. Abdominal ultrasound March 2021 showed no aneurysm.  The right and left iliac arteries were dilated (followed by vascular surgery).  Chest CT June 2021 showed stable 4.4 cm ascending thoracic aortic aneurysm. Since last seen, he has some dyspnea on exertion but no orthopnea, PND, pedal edema, chest pain or syncope.  No bleeding.  Current Outpatient Medications  Medication Sig Dispense Refill   acetaminophen (TYLENOL) 325 MG tablet Take 650 mg by mouth every 6 (six) hours as needed for mild pain.     albuterol (PROVENTIL HFA;VENTOLIN HFA) 108 (90 Base) MCG/ACT inhaler Inhale 2 puffs into the lungs every 6 (six) hours as needed for wheezing or shortness of breath. (Patient taking differently: Inhale 1 puff into the lungs every 6 (six) hours as needed for wheezing or shortness of breath. ) 1 Inhaler 0   ALPHAGAN P 0.1 % SOLN Place 1 drop into both eyes 3 (three) times daily.   3   atorvastatin (LIPITOR) 80 MG tablet TAKE 1 TABLET BY MOUTH  DAILY (Patient taking differently: Take 80 mg by mouth daily. ) 90 tablet 3   BREO ELLIPTA 200-25 MCG/INH AEPB INHALE 1 PUFF INTO THE LUNGS EVERY DAY 60 each 2   brimonidine (ALPHAGAN) 0.15 % ophthalmic solution 1 drop 3 (three) times daily.     Cholecalciferol (VITAMIN D) 50 MCG (2000 UT) tablet Take 2,000 Units by mouth 2 (two) times daily.     dabigatran (PRADAXA) 150 MG CAPS capsule TAKE 1 CAPSULE BY MOUTH  EVERY 12 HOURS 180 capsule 1   diphenhydrAMINE (BENADRYL) 25 MG tablet Take 25-50 mg by mouth at bedtime as needed for sleep.       dofetilide (TIKOSYN) 500 MCG capsule TAKE 1 CAPSULE BY MOUTH  TWICE DAILY (Patient taking differently: Take 500 mcg by mouth 2 (two) times daily. ) 180 capsule 3   famotidine (PEPCID) 10 MG tablet Take 10 mg by mouth as needed for heartburn.      fexofenadine (ALLEGRA) 180 MG tablet Take 180 mg by mouth daily as needed for allergies.      fish oil-omega-3 fatty acids 1000 MG capsule Take 1 g by mouth 2 (two) times daily.      fluticasone (FLONASE) 50 MCG/ACT nasal spray SPRAY 2 SPRAYS IN EACH NOSTRIL DAILY AS NEEDED FOR ALLERGIES 48 mL PRN   KLOR-CON M20 20 MEQ tablet TAKE 1 TABLET BY MOUTH EVERY DAY 90 tablet 3   Lancets (ONETOUCH ULTRASOFT) lancets Twice daily 100 each 6   Magnesium 250 MG TABS Take 250 mg by mouth daily. Patient takes 250 mg AM and 400 mg PM     magnesium oxide (MAG-OX) 400 (241.3 Mg) MG tablet Take 1 tablet (400 mg total) by mouth daily. (Patient taking differently: Take 400 mg by mouth at bedtime. ) 30 tablet 0   metFORMIN (GLUCOPHAGE-XR) 500 MG 24 hr tablet TAKE 1 TABLET BY MOUTH IN THE AM, AT NOON AND AT BEDTIME 270 tablet 3   methocarbamol (ROBAXIN) 500 MG tablet TAKE 1 TABLET BY MOUTH 4 TIMES A DAY AS NEEDED FOR MUSCLE SPASM (Patient  taking differently: Take 500 mg by mouth 4 (four) times daily as needed for muscle spasms. ) 120 tablet 2   MULTIPLE VITAMIN PO Take 1 tablet by mouth every evening.      nitroGLYCERIN (NITROSTAT) 0.4 MG SL tablet Place 1 tablet (0.4 mg total) under the tongue every 5 (five) minutes as needed for chest pain. 25 tablet 12   ondansetron (ZOFRAN ODT) 4 MG disintegrating tablet Take 1 tablet (4 mg total) by mouth every 8 (eight) hours as needed for nausea or vomiting. 24 tablet 0   ONETOUCH VERIO test strip USE AS DIRECTED TWICE A DAY 50 strip 12   pantoprazole (PROTONIX) 40 MG tablet Take 40 mg by mouth daily as needed (acid reflux).      PARoxetine (PAXIL) 20 MG tablet Take 1qd 90 tablet 1   tamsulosin (FLOMAX) 0.4 MG CAPS  capsule TAKE 1 CAPSULE BY MOUTH EVERY DAY (Patient taking differently: Take 0.4 mg by mouth daily as needed (repeat kidney stone). ) 30 capsule 5   traZODone (DESYREL) 50 MG tablet Take 1 tablet (50 mg total) by mouth at bedtime. 90 tablet 3   triamcinolone cream (KENALOG) 0.1 % Apply 1 application topically 2 (two) times daily. As needed. (Patient taking differently: Apply 1 application topically 2 (two) times daily as needed (irritation). ) 30 g 0   No current facility-administered medications for this visit.     Past Medical History:  Diagnosis Date   Atrial fibrillation ([place]) 03/22/2009   a. s/p multiple DCCV; b. no coumadin due to low TE risk profile; c. Tikosyn Rx   Coronary atherosclerosis of native coronary artery 11/2002   a. s/p stent to LAD 12/03; OM2 occluded at cath 12/03; d. myoview 5/10: no ischemia;  e. echo 7/11: EF 55%, BAE, mild RVE, PASP 41-45; Myoview was in March 2013. There was no ischemia or infarction, EF 51%    Cutaneous abscess of back excluding buttocks 07/04/2014   Appears to stem from possibly a cyst very large area 6 cm contact surgeon office    Diabetes mellitus without complication ([place])    Drusen body    see opth note   ERECTILE DYSFUNCTION 03/22/2009   GERD 03/22/2009   HYPERGLYCEMIA 04/25/2010   HYPERLIPIDEMIA 03/22/2009   Iliac aneurysm ([place])    2.6 to be evaluated incidental finding on CT   LATERAL EPICONDYLITIS, LEFT 10/24/2009   LIVER FUNCTION TESTS, ABNORMAL 04/25/2010   Local reaction to immunization 05/05/2012   minor resolving  zostavax    Myocardial infarction [facility]) mi2003   Numbness in left leg    foot related to back disease and surgery   Obesity, unspecified 04/24/2009   Perforated appendicitis with necrosis s/p open appendectomy 06/07/14 06/04/2014   Renal cyst    Characterized by MRI as simple   Ruptured suppurative appendicitis    2015    SLEEP APNEA, OBSTRUCTIVE 03/22/2009   compliant with CPAP   THROMBOCYTOPENIA  08/16/2010   TOBACCO USE, QUIT 10/24/2009   ULNAR NEUROPATHY, LEFT 3/32/9518   Umbilical hernia     Past Surgical History:  Procedure Laterality Date   APPENDECTOMY N/A 06/06/2014   Procedure: APPENDECTOMY;  Surgeon: Odis Hollingshead, MD;  Location: [facility];  Service: General;  Laterality: N/A;   COLON SURGERY     COLONOSCOPY  11/15/2011   Procedure: COLONOSCOPY;  Surgeon: Juanita Craver, MD;  Location: [facility];  Service: Endoscopy;  Laterality: N/A;   COLONOSCOPY WITH PROPOFOL N/A 01/03/2017   Procedure: COLONOSCOPY WITH  PROPOFOL;  Surgeon: Carol Ada, MD;  Location: [facility];  Service: Endoscopy;  Laterality: N/A;   COLONOSCOPY WITH PROPOFOL N/A 05/26/2020   Procedure: COLONOSCOPY WITH PROPOFOL;  Surgeon: Carol Ada, MD;  Location: [facility];  Service: Endoscopy;  Laterality: N/A;   cyst on epiglottis  08/2002   ESOPHAGOGASTRODUODENOSCOPY  11/15/2011   Procedure: ESOPHAGOGASTRODUODENOSCOPY (EGD);  Surgeon: Juanita Craver, MD;  Location: [facility];  Service: Endoscopy;  Laterality: N/A;   LAPAROSCOPIC APPENDECTOMY N/A 06/06/2014   Procedure: APPENDECTOMY LAPAROSCOPIC attemted;  Surgeon: Odis Hollingshead, MD;  Location: [facility];  Service: General;  Laterality: N/A;   LUMBAR DISC SURGERY     two  holes in spinalcovering   POLYPECTOMY  05/26/2020   Procedure: POLYPECTOMY;  Surgeon: Carol Ada, MD;  Location: [facility];  Service: Endoscopy;;   stent     LAD DUS 2004   STERNAL WOUND DEBRIDEMENT Left 08/31/2019   Procedure: LEFT STERNOCLAVICULAR WOUND DEBRIDEMENT WITH APPLICATION OF WOUND VAC;  Surgeon: Grace Isaac, MD;  Location: [place];  Service: Thoracic;  Laterality: Left;   surgery l4-l5   1998   ruptured x 3   ulnar neuropathy     UMBILICAL HERNIA REPAIR     mesh    Social History   Socioeconomic History   Marital status: Married    Spouse name: Not on file   Number of children: Not on file   Years of education: Not on file   Highest  education level: Not on file  Occupational History   Not on file  Tobacco Use   Smoking status: Former Smoker    Packs/day: 2.00    Years: 42.00    Pack years: 84.00    Types: Cigarettes    Quit date: 12/30/2008    Years since quitting: 11.8   Smokeless tobacco: Never Used   Tobacco comment: started at age 22; 1-2 ppd; quit 2010  Vaping Use   Vaping Use: Never used  Substance and Sexual Activity   Alcohol use: No    Alcohol/week: 0.0 standard drinks    Comment: rarely; maybe 1 beer a year   Drug use: No   Sexual activity: Yes  Other Topics Concern   Not on file  Social History Narrative   Retired paramedic   Regular exercise-yes not recently    Has children   Wife is overweight and has fibromyalgia and depression on disability doesn't go out much. Back surgery    Has older dog   Retired from [employer] 31 years of service now 7 years    Caffeine- minimal   Social Determinants of Health   Financial Resource Strain:    Difficulty of Paying Living Expenses: Not on file  Food Insecurity:    Worried About Charity fundraiser in the Last Year: Not on file   [employer] of Food in the Last Year: Not on file  Transportation Needs: No Transportation Needs   Lack of Transportation (Medical): No   Lack of Transportation (Non-Medical): No  Physical Activity: Inactive   Days of Exercise per Week: 0 days   Minutes of Exercise per Session: 0 min  Stress:    Feeling of Stress : Not on file  Social Connections:    Frequency of Communication with Friends and Family: Not on file   Frequency of Social Gatherings with Friends and Family: Not on file   Attends Religious Services: Not on file   Active Member of Clubs or Organizations: Not on file  Attends Music therapist: Not on file   Marital Status: Not on file  Intimate Partner Violence:    Fear of Current or Ex-Partner: Not on file   Emotionally Abused: Not on file   Physically Abused: Not on file    Sexually Abused: Not on file    Family History  Problem Relation Age of Onset   Thyroid disease Mother    Ovarian cancer Mother    Breast cancer Mother    Lung cancer Father    Cancer Father     ROS: no fevers or chills, productive cough, hemoptysis, dysphasia, odynophagia, melena, hematochezia, dysuria, hematuria, rash, seizure activity, orthopnea, PND, pedal edema, claudication. Remaining systems are negative.  Physical Exam: Well-developed obese in no acute distress.  Skin is warm and dry.  HEENT is normal.  Neck is supple.  Chest is clear to auscultation with normal expansion.  Cardiovascular exam is regular rate and rhythm.  Abdominal exam nontender or distended. No masses palpated. Extremities show no edema. neuro grossly intact  ECG-sinus rhythm with occasional PAC, first-degree AV block, normal QT interval.  Personally reviewed  A/P  1 paroxysmal atrial fibrillation-patient is in sinus rhythm today.  Continue Tikosyn and Pradaxa.    2 thoracic aortic aneurysm-plan follow-up CTA June 2022.  3 coronary artery disease-patient denies chest pain.  Continue statin.  He is not on aspirin given need for Pradaxa.  4 hyperlipidemia-continue statin.  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  6 morbid obesity-patient continues efforts at weight loss.  [name], MD

## 2020-10-23 ENCOUNTER — Ambulatory Visit (INDEPENDENT_AMBULATORY_CARE_PROVIDER_SITE_OTHER): Payer: Medicare Other | Admitting: Cardiology

## 2020-10-23 ENCOUNTER — Other Ambulatory Visit: Payer: Self-pay

## 2020-10-23 ENCOUNTER — Encounter: Payer: Self-pay | Admitting: Cardiology

## 2020-10-23 VITALS — BP 166/58 | HR 59 | Ht 73.0 in | Wt 291.6 lb

## 2020-10-23 DIAGNOSIS — I2510 Atherosclerotic heart disease of native coronary artery without angina pectoris: Secondary | ICD-10-CM

## 2020-10-23 DIAGNOSIS — E7800 Pure hypercholesterolemia, unspecified: Secondary | ICD-10-CM | POA: Diagnosis not present

## 2020-10-23 DIAGNOSIS — I480 Paroxysmal atrial fibrillation: Secondary | ICD-10-CM

## 2020-10-23 NOTE — Patient Instructions (Signed)
  Follow-Up: At [facility], you and your health needs are our priority.  As part of our continuing mission to provide you with exceptional heart care, we have created designated Provider Care Teams.  These Care Teams include your primary Cardiologist (physician) and Advanced Practice Providers (APPs -  Physician Assistants and Nurse Practitioners) who all work together to provide you with the care you need, when you need it.  We recommend signing up for the patient portal called "MyChart".  Sign up information is provided on this After Visit Summary.  MyChart is used to connect with patients for Virtual Visits (Telemedicine).  Patients are able to view lab/test results, encounter notes, upcoming appointments, etc.  Non-urgent messages can be sent to your provider as well.   To learn more about what you can do with MyChart, go to [email or web address].    Your next appointment:   6 month(s)  The format for your next appointment:   In Person  Provider:   [name], MD   Other Instructions Megan Mans

## 2020-10-27 DIAGNOSIS — I10 Essential (primary) hypertension: Secondary | ICD-10-CM

## 2020-10-28 ENCOUNTER — Ambulatory Visit: Payer: Medicare Other | Attending: Internal Medicine

## 2020-10-28 DIAGNOSIS — Z23 Encounter for immunization: Secondary | ICD-10-CM

## 2020-10-28 NOTE — Progress Notes (Signed)
   [id number]  Name:  [name] [name].    MRN: [id number] DOB: 1951-05-12  10/28/2020  [name] was observed post Covid-19 immunization for 15 minutes without incident. He was provided with Vaccine Information Sheet and instruction to access the V-Safe system.   [name] was instructed to call 911 with any severe reactions post vaccine: [name] Difficulty breathing  . Swelling of face and throat  . A fast heartbeat  . A bad rash all over body  . Dizziness and weakness

## 2020-10-30 NOTE — Telephone Encounter (Signed)
Take blood pressure readings twice a day for 7- 10 days and then periodically .0   .Send in readings      Make Fu appt in 1-2 weeks  And bring in your monitor with you when you come. In

## 2020-11-03 MED ORDER — APIXABAN 5 MG PO TABS
5.0000 mg | ORAL_TABLET | Freq: Two times a day (BID) | ORAL | 3 refills | Status: DC
Start: 2020-11-03 — End: 2021-07-16

## 2020-11-03 NOTE — Telephone Encounter (Signed)
Spoke with patient. Instructed on how to take Eliquis. Per Dr. Stanford Breed: DC pradaxa and begin apixaban 5 mg BID  [name]   Message text

## 2020-11-10 MED ORDER — ALPRAZOLAM 0.5 MG PO TABS: 0.5000 mg | ORAL_TABLET | Freq: Two times a day (BID) | ORAL | 0 refills | Status: DC | PRN

## 2020-11-10 MED ORDER — LOSARTAN POTASSIUM 50 MG PO TABS: 50.0000 mg | ORAL_TABLET | Freq: Every day | ORAL | 1 refills | Status: DC

## 2020-11-10 NOTE — Telephone Encounter (Signed)
Wow those bps are too high  Not sure if paxil is the problem   We need to add medication and ask cardiology about this also  )  start over weekend and make fu appt  Also involve dr Stanford Breed  I will send in losartan  To try if you are not allergic   In interim   Also a refill on small amount of alprazolam if the anxiety is getting too much over the weekend .

## 2020-11-13 ENCOUNTER — Other Ambulatory Visit: Payer: Self-pay | Admitting: *Deleted

## 2020-11-13 DIAGNOSIS — I10 Essential (primary) hypertension: Secondary | ICD-10-CM

## 2020-11-13 NOTE — Progress Notes (Signed)
Chief Complaint  Patient presents with  . Hypertension    HPI: [name] [name]. 69 y.o. come in for elevation of BP   See notes    He noted he was having headaches very high blood pressure readings and some increase in his anxiety symptoms.  We placed him on losartan over the weekend and he took a total of 2 alprazolam which did help him sleep and helped somewhat. He describes the anxiety as hard to get a deep breath but with normal vital signs usually when he sitting watching TV at night but not when he is with friends or doing other things.  There is no cough PND. Sometimes his diaphragm feels tight. Feels that the paxil 20 not  working since about September  Blood pressure readings over the weekend have come down to the 1 6170 range from 200 he got a new monitor and has a sphygmomanometer at home. ROS: See pertinent positives and negatives per HPI. No chest pain significant edema does have varicose veins but no change no unusual bleeding no a regular rhythm pulse ox has been okay. No fever no stridor  No cough  Of note he relates a past history of an anesthetized emergency intubation nasotracheal for an epiglottitis episode years ago  Assoc with  Dyspnea .   Past Medical History:  Diagnosis Date  . Atrial fibrillation ([place]) 03/22/2009   a. s/p multiple DCCV; b. no coumadin due to low TE risk profile; c. Tikosyn Rx  . Coronary atherosclerosis of native coronary artery 11/2002   a. s/p stent to LAD 12/03; OM2 occluded at cath 12/03; d. myoview 5/10: no ischemia;  e. echo 7/11: EF 55%, BAE, mild RVE, PASP 41-45; Myoview was in March 2013. There was no ischemia or infarction, EF 51%   . Cutaneous abscess of back excluding buttocks 07/04/2014   Appears to stem from possibly a cyst very large area 6 cm contact surgeon office   . Diabetes mellitus without complication ([place])   . Drusen body    see opth note  . Epiglottitis    w emergency nt intubation  . ERECTILE DYSFUNCTION 03/22/2009  .  GERD 03/22/2009  . HYPERGLYCEMIA 04/25/2010  . HYPERLIPIDEMIA 03/22/2009  . 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 LATERAL EPICONDYLITIS, LEFT 10/24/2009  . LIVER FUNCTION TESTS, ABNORMAL 04/25/2010  . Local reaction to immunization 05/05/2012   minor resolving  zostavax   . Myocardial infarction ([place]) mi2003  . Numbness in left leg    foot related to back disease and surgery  . Obesity, unspecified 04/24/2009  . Perforated appendicitis with necrosis s/p open appendectomy 06/07/14 06/04/2014  . Renal cyst    Characterized by MRI as simple  . Ruptured suppurative appendicitis    2015   . SLEEP APNEA, OBSTRUCTIVE 03/22/2009   compliant with CPAP  . THROMBOCYTOPENIA 08/16/2010  . TOBACCO USE, QUIT 10/24/2009  . 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 Umbilical hernia     Family History  Problem Relation Age of Onset  . Thyroid disease Mother   . Ovarian cancer Mother   . Breast cancer Mother   . Lung cancer Father   . Cancer Father     Social History   Socioeconomic History  . Marital status: Married    Spouse name: Not on file  . Number of children: Not on file  . Years of education: Not on file  . Highest education  level: Not on file  Occupational History  . Not on file  Tobacco Use  . Smoking status: Former Smoker    Packs/day: 2.00    Years: 42.00    Pack years: 84.00    Types: Cigarettes    Quit date: 12/30/2008    Years since quitting: 11.8  . Smokeless tobacco: Never Used  . Tobacco comment: started at age 75; 1-2 ppd; quit 2010  Vaping Use  . Vaping Use: Never used  Substance and Sexual Activity  . Alcohol use: No    Alcohol/week: 0.0 standard drinks    Comment: rarely; maybe 1 beer a year  . Drug use: No  . Sexual activity: Yes  Other Topics Concern  . Not on file  Social History Narrative   Retired paramedic   Regular exercise-yes not recently    Has children   Wife is overweight and has fibromyalgia and depression on  disability doesn't go out much. Back surgery    Has older dog   Retired from [employer] 31 years of service now 7 years    Caffeine- minimal   Social Determinants of Health   Financial Resource Strain:   . Difficulty of Paying Living Expenses: Not on file  Food Insecurity:   . Worried About Charity fundraiser in the Last Year: Not on file  . Ran Out of Food in the Last Year: Not on file  Transportation Needs: No Transportation Needs  . Lack of Transportation (Medical): No  . Lack of Transportation (Non-Medical): No  Physical Activity: Inactive  . Days of Exercise per Week: 0 days  . Minutes of Exercise per Session: 0 min  Stress:   . Feeling of Stress : Not on file  Social Connections:   . Frequency of Communication with Friends and Family: Not on file  . Frequency of Social Gatherings with Friends and Family: Not on file  . Attends Religious Services: Not on file  . Active Member of Clubs or Organizations: Not on file  . Attends Archivist Meetings: Not on file  . Marital Status: Not on file    Outpatient Medications Prior to Visit  Medication Sig Dispense Refill  . acetaminophen (TYLENOL) 325 MG tablet Take 650 mg by mouth every 6 (six) hours as needed for mild pain.    [name] albuterol (PROVENTIL HFA;VENTOLIN HFA) 108 (90 Base) MCG/ACT inhaler Inhale 2 puffs into the lungs every 6 (six) hours as needed for wheezing or shortness of breath. (Patient taking differently: Inhale 1 puff into the lungs every 6 (six) hours as needed for wheezing or shortness of breath. ) 1 Inhaler 0  . ALPHAGAN P 0.1 % SOLN Place 1 drop into both eyes 3 (three) times daily.   3  . ALPRAZolam (XANAX) 0.5 MG tablet Take 1 tablet (0.5 mg total) by mouth 2 (two) times daily as needed for anxiety. 15 tablet 0  . apixaban (ELIQUIS) 5 MG TABS tablet Take 1 tablet (5 mg total) by mouth 2 (two) times daily. 180 tablet 3  . atorvastatin (LIPITOR) 80 MG tablet TAKE 1 TABLET BY MOUTH  DAILY (Patient taking  differently: Take 80 mg by mouth daily. ) 90 tablet 3  . BREO ELLIPTA 200-25 MCG/INH AEPB INHALE 1 PUFF INTO THE LUNGS EVERY DAY 60 each 2  . brimonidine (ALPHAGAN) 0.15 % ophthalmic solution 1 drop 3 (three) times daily.    . Cholecalciferol (VITAMIN D) 50 MCG (2000 UT) tablet Take 2,000 Units by mouth 2 (two)  times daily.    . diphenhydrAMINE (BENADRYL) 25 MG tablet Take 25-50 mg by mouth at bedtime as needed for sleep.     [name] dofetilide (TIKOSYN) 500 MCG capsule TAKE 1 CAPSULE BY MOUTH  TWICE DAILY (Patient taking differently: Take 500 mcg by mouth 2 (two) times daily. ) 180 capsule 3  . famotidine (PEPCID) 10 MG tablet Take 10 mg by mouth as needed for heartburn.     . fexofenadine (ALLEGRA) 180 MG tablet Take 180 mg by mouth daily as needed for allergies.     . fish oil-omega-3 fatty acids 1000 MG capsule Take 1 g by mouth 2 (two) times daily.     . fluticasone (FLONASE) 50 MCG/ACT nasal spray SPRAY 2 SPRAYS IN EACH NOSTRIL DAILY AS NEEDED FOR ALLERGIES 48 mL PRN  . KLOR-CON M20 20 MEQ tablet TAKE 1 TABLET BY MOUTH EVERY DAY 90 tablet 3  . Lancets (ONETOUCH ULTRASOFT) lancets Twice daily 100 each 6  . losartan (COZAAR) 50 MG tablet Take 1 tablet (50 mg total) by mouth daily. 30 tablet 1  . Magnesium 250 MG TABS Take 250 mg by mouth daily. Patient takes 250 mg AM and 400 mg PM    . magnesium oxide (MAG-OX) 400 (241.3 Mg) MG tablet Take 1 tablet (400 mg total) by mouth daily. (Patient taking differently: Take 400 mg by mouth at bedtime. ) 30 tablet 0  . metFORMIN (GLUCOPHAGE-XR) 500 MG 24 hr tablet TAKE 1 TABLET BY MOUTH IN THE AM, AT NOON AND AT BEDTIME 270 tablet 3  . methocarbamol (ROBAXIN) 500 MG tablet TAKE 1 TABLET BY MOUTH 4 TIMES A DAY AS NEEDED FOR MUSCLE SPASM (Patient taking differently: Take 500 mg by mouth 4 (four) times daily as needed for muscle spasms. ) 120 tablet 2  . MULTIPLE VITAMIN PO Take 1 tablet by mouth every evening.     . nitroGLYCERIN (NITROSTAT) 0.4 MG SL tablet Place  1 tablet (0.4 mg total) under the tongue every 5 (five) minutes as needed for chest pain. 25 tablet 12  . ondansetron (ZOFRAN ODT) 4 MG disintegrating tablet Take 1 tablet (4 mg total) by mouth every 8 (eight) hours as needed for nausea or vomiting. 24 tablet 0  . ONETOUCH VERIO test strip USE AS DIRECTED TWICE A DAY 50 strip 12  . pantoprazole (PROTONIX) 40 MG tablet Take 40 mg by mouth daily as needed (acid reflux).     [name] PARoxetine (PAXIL) 20 MG tablet Take 1qd 90 tablet 1  . tamsulosin (FLOMAX) 0.4 MG CAPS capsule TAKE 1 CAPSULE BY MOUTH EVERY DAY (Patient taking differently: Take 0.4 mg by mouth daily as needed (repeat kidney stone). ) 30 capsule 5  . traZODone (DESYREL) 50 MG tablet Take 1 tablet (50 mg total) by mouth at bedtime. 90 tablet 3  . triamcinolone cream (KENALOG) 0.1 % Apply 1 application topically 2 (two) times daily. As needed. (Patient taking differently: Apply 1 application topically 2 (two) times daily as needed (irritation). ) 30 g 0   No facility-administered medications prior to visit.     EXAM:  BP (!) 144/66   Pulse 70   Temp (!) 97.4 F (36.3 C) (Oral)   Ht 6\' 1"  (1.854 m)   Wt 290 lb 9.6 oz (131.8 kg)   SpO2 96%   BMI 38.34 kg/m   Body mass index is 38.34 kg/m. Wt Readings from Last 3 Encounters:  11/14/20 290 lb 9.6 oz (131.8 kg)  10/23/20 291 lb 9.6  oz (132.3 kg)  07/21/20 (!) 288 lb (130.6 kg)    GENERAL: vitals reviewed and listed above, alert, oriented, appears well hydrated and in no acute distress no stridor  HEENT: atraumatic, conjunctiva  clear, no obvious abnormalities on inspection of external nose and ears OP : masked   NECK: no obvious masses on inspection palpation  LUNGS: clear to auscultation bilaterally, no wheezes, rales or rhonchi, good air movement CV: HRRR, no clubbing cyanosis or  peripheral edema nl cap refill   Vv  No lesions  MS: moves all extremities without noticeable focal  abnormality PSYCH: pleasant and cooperative, nl  cognition mild anxiety   Lab Results  Component Value Date   WBC 7.2 04/25/2020   HGB 14.4 04/25/2020   HCT 43.4 04/25/2020   PLT 199 04/25/2020   GLUCOSE 132 (H) 09/14/2020   CHOL 106 04/25/2020   TRIG 54 04/25/2020   HDL 56 04/25/2020   LDLCALC 37 04/25/2020   ALT 29 09/14/2020   AST 20 09/14/2020   NA 144 09/14/2020   K 4.9 09/14/2020   CL 107 09/14/2020   CREATININE 1.21 (H) 09/14/2020   BUN 32 (H) 09/14/2020   CO2 28 09/14/2020   TSH 1.414 08/27/2019   PSA 1.28 09/14/2020   INR 1.2 08/31/2019   HGBA1C 6.3 (H) 09/14/2020   MICROALBUR 18.7 (H) 02/08/2019   BP Readings from Last 3 Encounters:  11/14/20 (!) 144/66  10/23/20 (!) 166/58  07/21/20 (!) 140/62    ASSESSMENT AND PLAN:  Discussed the following assessment and plan:  Hypertension, unspecified type  Medication management  Anxious mood as adjustment reaction  Breathing problem Anxiety reaction  Related cause  of  Dyspnea based on hx and patient perception    Inc paxil to 30 mg and then 40 if  No effect  Ok to use alpaz short term for now as needed for anxiety call for refills   Plan virtual or fu in about 2 weeks  Close observation    And follow   Because of his sig underlying   Conditions    Will have Dr.   Stanford Breed  Take over the HT management   To avoid  Confusion  -Patient advised to return or notify health care team  if  new concerns arise.  Patient Instructions  Increase the  paxil  30 mg per day  For 1-2 weeks  and if not suppressing  Then increase to 40 mg per day   Can use alprazolam as add on  If needed .    Ask for refills.      Video visit in   About 2  weeks   Will have dr Stanford Breed opine about the BP    control .     Standley Brooking. Ulric Salzman M.D.

## 2020-11-14 ENCOUNTER — Encounter: Payer: Self-pay | Admitting: Internal Medicine

## 2020-11-14 ENCOUNTER — Other Ambulatory Visit: Payer: Self-pay

## 2020-11-14 ENCOUNTER — Ambulatory Visit (INDEPENDENT_AMBULATORY_CARE_PROVIDER_SITE_OTHER): Payer: Medicare Other | Admitting: Internal Medicine

## 2020-11-14 VITALS — BP 144/66 | HR 70 | Temp 97.4°F | Ht 73.0 in | Wt 290.6 lb

## 2020-11-14 DIAGNOSIS — F4322 Adjustment disorder with anxiety: Secondary | ICD-10-CM | POA: Diagnosis not present

## 2020-11-14 DIAGNOSIS — Z79899 Other long term (current) drug therapy: Secondary | ICD-10-CM

## 2020-11-14 DIAGNOSIS — R069 Unspecified abnormalities of breathing: Secondary | ICD-10-CM

## 2020-11-14 DIAGNOSIS — I10 Essential (primary) hypertension: Secondary | ICD-10-CM

## 2020-11-14 NOTE — Patient Instructions (Signed)
Increase the  paxil  30 mg per day  For 1-2 weeks  and if not suppressing  Then increase to 40 mg per day   Can use alprazolam as add on  If needed .    Ask for refills.      Video visit in   About 2  weeks   Will have dr Stanford Breed opine about the BP    control .

## 2020-11-15 ENCOUNTER — Encounter: Payer: Self-pay | Admitting: *Deleted

## 2020-11-15 ENCOUNTER — Telehealth: Payer: Self-pay | Admitting: *Deleted

## 2020-11-15 MED ORDER — LOSARTAN POTASSIUM 100 MG PO TABS: 100.0000 mg | ORAL_TABLET | Freq: Every day | ORAL | 3 refills | Status: AC

## 2020-11-15 NOTE — Telephone Encounter (Signed)
Message sent to patient via my chart of dr Jacalyn Lefevre recommendations. New script sent to the pharmacy

## 2020-11-15 NOTE — Telephone Encounter (Signed)
-----   Message from Lelon Perla, MD sent at 11/15/2020  7:25 AM EST ----- Increase losartan to 100 mg daily; check bmet; follow BP [name] ----- Message ----- From: Burnis Medin, MD Sent: 11/14/2020   5:54 PM EST To: Lelon Perla, MD  BC  ok to having you take over the BP meds control  for clarity ,,  and I will advise on the anxiety  meds ? Thanks [facility]

## 2020-11-21 ENCOUNTER — Other Ambulatory Visit: Payer: Self-pay | Admitting: Internal Medicine

## 2020-11-21 NOTE — Telephone Encounter (Signed)
Last filled 11/10/20

## 2020-11-23 ENCOUNTER — Other Ambulatory Visit: Payer: Self-pay | Admitting: Internal Medicine

## 2020-11-24 ENCOUNTER — Other Ambulatory Visit: Payer: Self-pay | Admitting: Internal Medicine

## 2020-11-27 ENCOUNTER — Ambulatory Visit (INDEPENDENT_AMBULATORY_CARE_PROVIDER_SITE_OTHER): Payer: Medicare Other | Admitting: Pulmonary Disease

## 2020-11-27 ENCOUNTER — Other Ambulatory Visit: Payer: Self-pay

## 2020-11-27 ENCOUNTER — Encounter: Payer: Self-pay | Admitting: Pulmonary Disease

## 2020-11-27 VITALS — BP 158/88 | HR 63 | Temp 97.7°F | Ht 73.0 in | Wt 299.2 lb

## 2020-11-27 DIAGNOSIS — G4733 Obstructive sleep apnea (adult) (pediatric): Secondary | ICD-10-CM

## 2020-11-27 DIAGNOSIS — J432 Centrilobular emphysema: Secondary | ICD-10-CM

## 2020-11-27 DIAGNOSIS — J849 Interstitial pulmonary disease, unspecified: Secondary | ICD-10-CM

## 2020-11-27 DIAGNOSIS — E669 Obesity, unspecified: Secondary | ICD-10-CM

## 2020-11-27 DIAGNOSIS — G4730 Sleep apnea, unspecified: Secondary | ICD-10-CM

## 2020-11-27 MED ORDER — ANORO ELLIPTA 62.5-25 MCG/INH IN AEPB: 1.0000 | INHALATION_SPRAY | Freq: Every day | RESPIRATORY_TRACT | 0 refills | Status: DC

## 2020-11-27 NOTE — Progress Notes (Signed)
[name] [name] [name], [name] [name] [name], [name] [name] [name]  Patient presents with  . Follow-up    periodic shortness of breath    Constitutional:  BP (!) 158/88 (BP Location: Left Arm, Cuff Size: Normal)   Pulse 63   Temp 97.7 F (36.5 C) (Other (Comment)) Comment (Src): wrist  Ht 6\' 1"  (1.854 m)   Wt 299 lb 3.2 oz (135.7 kg)   SpO2 94% Comment: Room air  BMI 39.47 kg/m   Past Medical History:  A fib, CAD, DM, ED, GERD, HLD, MSSA Bacteremia September 2020, Epiglottitis, Iliac artery aneurysm  Past Surgical History:  [name] [name] [name]  has a past surgical history that includes cyst on epiglottis (08/2002); surgery l4-l5  (1998); stent; ulnar neuropathy; Umbilical hernia repair; Lumbar disc surgery; Esophagogastroduodenoscopy (11/15/2011); Colonoscopy (11/15/2011); laparoscopic appendectomy (N/A, 06/06/2014); Colon surgery; Appendectomy (N/A, 06/06/2014); Colonoscopy with propofol (N/A, 01/03/2017); Sternal wound debridement (Left, 08/31/2019); Colonoscopy with propofol (N/A, 05/26/2020); [name] polypectomy (05/26/2020).  Brief Summary:  [name] [name] [name]. is a 69 y.o. male with COPD emphysema, obstructive sleep apnea       Subjective:   [name] [name] [name] gets episodes in which [name] [name] [name] feels like [name] [name] [name] needs to take deep breath.  This happens when [name] [name] [name] is sitting quietly alone.  Hasn't noticed when [name] [name] [name] is active or talking with people.  [name] [name] [name] feels anxious when this happens.  [name] [name] [name] is being treated for anxiety [name] PTSD which [name] [name] [name] relates to be critically ill with respiratory infections.  [name] [name] [name] gets occasional cough [name] congestion.  [name] [name] [name] uses CPAP nightly.  No issues with mask fit.  Physical Exam:   Appearance - well kempt   ENMT - no sinus tenderness, no oral exudate, no LAN, Mallampati 3 airway, no stridor  Respiratory - equal breath sounds bilaterally, no wheezing or rales  CV - s1s2 regular rate [name] rhythm, no murmurs  Ext - no clubbing, no edema  Skin - no rashes  Psych - normal mood [name] affect   [name] [name]  testing:   PFT 03/25/18 >> FEV1 2.93 (85%),FEV1% 81, TLC 5.84 (80%), DLCO 46%  A1AT 03/25/18 >> 139, MM  Chest Imaging:   HRCT chest 02/25/18 >> atherosclerosis, enlarged PA, centrilobular [name] paraseptal emphysema, patchy GGO, mild traction BTX, 3 mm nodule RML  CT chest 02/04/19 >> 4.5 cm TAA, atherosclerosis, mild/mod centrilobular [name] paraseptal emphysema, 8 mm RML nodule, patchy GGO [name] subpleural reticulation with mild traction BTX with basilar predominance no change  CT chest 02/21/20 >> 4.4 cm ascending aorta, atherosclerosis, centrilobular [name] paraseptal emphysema, b/l lower lung predominant interstitial reticulation with traction BTX [name] GGO, 8 mm RML nodule, 4 mm RUL nodule, gallstones  Sleep Tests:   PSG 12/26/02 >> AHI 25  Auto CPAP10/29/21 to 11/25/20 >> used on 28 of 30 nights with average 4 hrs 21 min.  Average AHI 18.4 with median CPAP 14 [name] 95 th percentile CPAP 17 cm H2O  Cardiac Tests:   Echo 08/29/19 >> EF 60 to 65%  Social History:  [name] [name] [name]  reports that [name] [name] [name] quit smoking about 11 years ago. [name] [name] [name] smoking use included cigarettes. [name] [name] [name] has a 84.00 pack-year smoking history. [name] [name] [name] has never used smokeless tobacco. [name] [name] [name] reports that [name] [name] [name] does not drink alcohol [name] does not use drugs.  Family History:  [name] [name] [name] family history includes Breast cancer in [name] [name] [name] mother; Cancer in [name] [name] [name] father; Lung cancer in [name] [name] [name] father; Ovarian cancer in [name] [name] [name] mother; Thyroid disease in [name] [name] [name] mother.     Assessment/Plan:   COPD with emphysema. - will change  from breo to anoro - prn albuterol  Interstitial lung disease. - monitor clinically  Obstructive sleep apnea. - [name] [name] [name] reports benefit from CPAP therapy [name] [name] [name] [name] is compliant - [name] [name] [name] uses [employer] for [name] [name] [name] DME - encouraged him to use CPAP for the entire time [name] [name] [name] is asleep to get maximal benefit - I am concerned [name] [name] [name] could be having episodes of microsleep with sleep apnea during the day [name] this triggers [name] [name] [name] feeling of anxiety [name] needing to sigh - continue auto CPAP 5 to  20 cm H2O  Insomnia with anxiety. - trazodone, paxil per PCP  Thoracic aortic aneurysm. - f/u with Dr. Servando Snare with [facility] - defer further imaging studies to Dr. Servando Snare  Time Spent Involved in Patient [name] [name] on Day of Examination:  34 minutes  Follow up:  Patient Instructions  Start anoro one puff daily [name] send email if you want to continue this  Stop using Breo  Follow up in 6 months   Medication List:   Allergies as of 11/27/2020      Reactions   Pseudoephedrine Other (See Comments)   Patient went into afib   Diltiazem Rash, Itching   Also 2019   Novocain [procaine] Hives   Dentist appointment in 1958; since then has tolerated lidocaine [name] provocaine with no hives or difficulty.   Quinolones    Patient was warned about not using Cipro [name] similar antibiotics. Recent studies have raised concern that fluoroquinolone antibiotics could be associated with an increased risk of aortic aneurysm Fluoroquinolones have non-antimicrobial properties that might jeopardise the integrity of the extracellular matrix of the vascular wall In a  propensity score matched cohort study in [place], there was a 66% increased rate of aortic aneurysm or dissection associated with oral fluoroquinolone use, compared wit   Sulfonamide Derivatives Other (See Comments)   Childhood reaction       Medication List       Accurate as of November 27, 2020  5:30 PM. If you have any questions, ask your nurse or doctor.        STOP taking these medications   Breo Ellipta 200-25 MCG/INH Aepb Generic drug: fluticasone furoate-vilanterol Stopped by: Chesley Mires, MD   magnesium oxide 400 (241.3 Mg) MG tablet Commonly known as: MAG-OX Stopped by: Chesley Mires, MD     TAKE these medications   acetaminophen 325 MG tablet Commonly known as: TYLENOL Take 650 mg by mouth every 6 (six) hours as needed for mild pain.   albuterol 108 (90 Base) MCG/ACT inhaler Commonly known as: VENTOLIN HFA Inhale 2  puffs into the lungs every 6 (six) hours as needed for wheezing or shortness of breath. What changed: how much to take   Alphagan P 0.1 % Soln Generic drug: brimonidine Place 1 drop into both eyes 3 (three) times daily.   brimonidine 0.15 % ophthalmic solution Commonly known as: ALPHAGAN 1 drop 3 (three) times daily.   ALPRAZolam 0.5 MG tablet Commonly known as: XANAX TAKE 1 TABLET BY MOUTH 2 TIMES DAILY AS NEEDED FOR ANXIETY.   Anoro Ellipta 62.5-25 MCG/INH Aepb Generic drug: umeclidinium-vilanterol Inhale 1 puff into the lungs daily. Started by: Chesley Mires, MD   apixaban 5 MG Tabs tablet Commonly known as: ELIQUIS Take 1 tablet (5 mg total) by mouth 2 (two) times daily.   atorvastatin 80 MG tablet Commonly known as: LIPITOR TAKE 1 TABLET BY MOUTH  DAILY   diphenhydrAMINE 25 MG tablet Commonly known as: BENADRYL Take 25-50 mg by mouth at bedtime as  needed for sleep.   dofetilide 500 MCG capsule Commonly known as: TIKOSYN TAKE 1 CAPSULE BY MOUTH  TWICE DAILY   famotidine 10 MG tablet Commonly known as: PEPCID Take 10 mg by mouth as needed for heartburn.   fexofenadine 180 MG tablet Commonly known as: ALLEGRA Take 180 mg by mouth daily as needed for allergies.   fish oil-omega-3 fatty acids 1000 MG capsule Take 1 g by mouth 2 (two) times daily.   fluticasone 50 MCG/ACT nasal spray Commonly known as: FLONASE SPRAY 2 SPRAYS IN EACH NOSTRIL DAILY AS NEEDED FOR ALLERGIES   Klor-Con M20 20 MEQ tablet Generic drug: potassium chloride SA TAKE 1 TABLET BY MOUTH EVERY DAY   losartan 100 MG tablet Commonly known as: COZAAR Take 1 tablet (100 mg total) by mouth daily.   Magnesium 250 MG Tabs Take 250 mg by mouth daily. Patient takes 250 mg AM [name] 500 mg PM   metFORMIN 500 MG 24 hr tablet Commonly known as: GLUCOPHAGE-XR TAKE 1 TABLET BY MOUTH IN THE AM, AT NOON [name] AT BEDTIME   methocarbamol 500 MG tablet Commonly known as: ROBAXIN TAKE 1 TABLET BY MOUTH 4  TIMES A DAY AS NEEDED FOR MUSCLE SPASM What changed: See the new instructions.   MULTIPLE VITAMIN PO Take 1 tablet by mouth every evening.   nitroGLYCERIN 0.4 MG SL tablet Commonly known as: NITROSTAT Place 1 tablet (0.4 mg total) under the tongue every 5 (five) minutes as needed for chest pain.   ondansetron 4 MG disintegrating tablet Commonly known as: Zofran ODT Take 1 tablet (4 mg total) by mouth every 8 (eight) hours as needed for nausea or vomiting.   onetouch ultrasoft lancets Twice daily   OneTouch Verio test strip Generic drug: glucose blood USE AS DIRECTED TWICE A DAY   pantoprazole 40 MG tablet Commonly known as: PROTONIX Take 40 mg by mouth daily as needed (acid reflux).   PARoxetine 20 MG tablet Commonly known as: PAXIL Take 1qd   tamsulosin 0.4 MG Caps capsule Commonly known as: FLOMAX TAKE 1 CAPSULE BY MOUTH EVERY DAY What changed:   when to take this  reasons to take this   traZODone 50 MG tablet Commonly known as: DESYREL Take 1 tablet (50 mg total) by mouth at bedtime.   triamcinolone 0.1 % Commonly known as: KENALOG Apply 1 application topically 2 (two) times daily. As needed. What changed:   when to take this  reasons to take this  additional instructions   Vitamin D 50 MCG (2000 UT) tablet Take 2,000 Units by mouth 2 (two) times daily.       Signature:  Chesley Mires, MD [place] Pager - [phone] 11/27/2020, 5:30 PM

## 2020-11-27 NOTE — Patient Instructions (Signed)
Start anoro one puff daily and send email if you want to continue this  Stop using Breo  Follow up in 6 months

## 2020-11-28 ENCOUNTER — Telehealth (INDEPENDENT_AMBULATORY_CARE_PROVIDER_SITE_OTHER): Payer: Medicare Other | Admitting: Internal Medicine

## 2020-11-28 DIAGNOSIS — R069 Unspecified abnormalities of breathing: Secondary | ICD-10-CM

## 2020-11-28 DIAGNOSIS — Z79899 Other long term (current) drug therapy: Secondary | ICD-10-CM

## 2020-11-28 DIAGNOSIS — F4322 Adjustment disorder with anxiety: Secondary | ICD-10-CM

## 2020-11-28 MED ORDER — PAROXETINE HCL 20 MG PO TABS
40.0000 mg | ORAL_TABLET | Freq: Every day | ORAL | 1 refills | Status: DC
Start: 2020-11-28 — End: 2021-04-02

## 2020-11-28 NOTE — Progress Notes (Signed)
Virtual Visit via Video Note  I connected with@ on 11/28/20 at  3:00 PM EST by a video enabled telemedicine application and verified that I am speaking with the correct person using two identifiers. Location patient: home Location provider:work  office Persons participating in the virtual visit: patient, provider technichal difficulties    Web site crashing issues   [place] national recommendations  regarding COVID 19 pandemic   video visit is advised over in office visit for this patient.  Patient aware  of the limitations of evaluation and management by telemedicine and  availability of in person appointments. and agreed to proceed.   HPI: [name] [name]. presents for video visit  [place] paxil and add on  Benzo as needed   Saw pulmonary and breo changed to  anoro  11 29  Does not think much change in the panic breathing issue episodes. He is worried about his blood pressure still being elevated in the 160 range is now on losartan 100 mg.  He has not had this much trouble with high blood pressure before. He has been taking the Paxil right before bed and states it is giving him heartburn so he has to sleep upright last night and did not use his CPAP. ROS: See pertinent positives and negatives per HPI.  Past Medical History:  Diagnosis Date  . Atrial fibrillation ([place]) 03/22/2009   a. s/p multiple DCCV; b. no coumadin due to low TE risk profile; c. Tikosyn Rx  . Coronary atherosclerosis of native coronary artery 11/2002   a. s/p stent to LAD 12/03; OM2 occluded at cath 12/03; d. myoview 5/10: no ischemia;  e. echo 7/11: EF 55%, BAE, mild RVE, PASP 41-45; Myoview was in March 2013. There was no ischemia or infarction, EF 51%   . Cutaneous abscess of back excluding buttocks 07/04/2014   Appears to stem from possibly a cyst very large area 6 cm contact surgeon office   . Diabetes mellitus without complication ([place])   . Drusen body    see opth note  . Epiglottitis    w emergency nt  intubation  . ERECTILE DYSFUNCTION 03/22/2009  . GERD 03/22/2009  . HYPERGLYCEMIA 04/25/2010  . HYPERLIPIDEMIA 03/22/2009  . 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 LATERAL EPICONDYLITIS, LEFT 10/24/2009  . LIVER FUNCTION TESTS, ABNORMAL 04/25/2010  . Local reaction to immunization 05/05/2012   minor resolving  zostavax   . Myocardial infarction ([place]) mi2003  . Numbness in left leg    foot related to back disease and surgery  . Obesity, unspecified 04/24/2009  . Perforated appendicitis with necrosis s/p open appendectomy 06/07/14 06/04/2014  . Renal cyst    Characterized by MRI as simple  . Ruptured suppurative appendicitis    2015   . SLEEP APNEA, OBSTRUCTIVE 03/22/2009   compliant with CPAP  . THROMBOCYTOPENIA 08/16/2010  . TOBACCO USE, QUIT 10/24/2009  . 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 Umbilical hernia     Past Surgical History:  Procedure Laterality Date  . APPENDECTOMY N/A 06/06/2014   Procedure: APPENDECTOMY;  Surgeon: Odis Hollingshead, MD;  Location: [facility];  Service: General;  Laterality: N/A;  . COLON SURGERY    . COLONOSCOPY  11/15/2011   Procedure: COLONOSCOPY;  Surgeon: Juanita Craver, MD;  [facility];  Service: Endoscopy;  Laterality: N/A;  . COLONOSCOPY WITH PROPOFOL N/A 01/03/2017   Procedure: COLONOSCOPY WITH PROPOFOL;  Surgeon: Carol Ada, MD;  [facility]: [facility];  Service: Endoscopy;  Laterality: N/A;  . COLONOSCOPY WITH PROPOFOL N/A 05/26/2020   Procedure: COLONOSCOPY WITH PROPOFOL;  Surgeon: Carol Ada, MD;  [facility];  Service: Endoscopy;  Laterality: N/A;  . cyst on epiglottis  08/2002  . ESOPHAGOGASTRODUODENOSCOPY  11/15/2011   Procedure: ESOPHAGOGASTRODUODENOSCOPY (EGD);  Surgeon: Juanita Craver, MD;  [facility];  Service: Endoscopy;  Laterality: N/A;  . LAPAROSCOPIC APPENDECTOMY N/A 06/06/2014   Procedure: APPENDECTOMY LAPAROSCOPIC attemted;  Surgeon: Odis Hollingshead, MD;  Location: [facility];   Service: General;  Laterality: N/A;  . LUMBAR DISC SURGERY     two  holes in spinalcovering  . POLYPECTOMY  05/26/2020   Procedure: POLYPECTOMY;  Surgeon: Carol Ada, MD;  [facility];  Service: Endoscopy;;  . stent     LAD DUS 2004  . STERNAL WOUND DEBRIDEMENT Left 08/31/2019   Procedure: LEFT STERNOCLAVICULAR WOUND DEBRIDEMENT WITH APPLICATION OF WOUND VAC;  Surgeon: Grace Isaac, MD;  Location: [facility];  Service: Thoracic;  Laterality: Left;  . surgery l4-l5   1998   ruptured x 3  . ulnar neuropathy    . UMBILICAL HERNIA REPAIR     mesh    Family History  Problem Relation Age of Onset  . Thyroid disease Mother   . Ovarian cancer Mother   . Breast cancer Mother   . Lung cancer Father   . Cancer Father     Social History   Tobacco Use  . Smoking status: Former Smoker    Packs/day: 2.00    Years: 42.00    Pack years: 84.00    Types: Cigarettes    Quit date: 12/30/2008    Years since quitting: 11.9  . Smokeless tobacco: Never Used  . Tobacco comment: started at age 18; 1-2 ppd; quit 2010  Vaping Use  . Vaping Use: Never used  Substance Use Topics  . Alcohol use: No    Alcohol/week: 0.0 standard drinks    Comment: rarely; maybe 1 beer a year  . Drug use: No      Current Outpatient Medications:  .  acetaminophen (TYLENOL) 325 MG tablet, Take 650 mg by mouth every 6 (six) hours as needed for mild pain., Disp: , Rfl:  .  albuterol (PROVENTIL HFA;VENTOLIN HFA) 108 (90 Base) MCG/ACT inhaler, Inhale 2 puffs into the lungs every 6 (six) hours as needed for wheezing or shortness of breath. (Patient taking differently: Inhale 1 puff into the lungs every 6 (six) hours as needed for wheezing or shortness of breath. ), Disp: 1 Inhaler, Rfl: 0 .  ALPHAGAN P 0.1 % SOLN, Place 1 drop into both eyes 3 (three) times daily. , Disp: , Rfl: 3 .  ALPRAZolam (XANAX) 0.5 MG tablet, TAKE 1 TABLET BY MOUTH 2 TIMES DAILY AS NEEDED FOR ANXIETY., Disp: 24 tablet, Rfl: 0 .   apixaban (ELIQUIS) 5 MG TABS tablet, Take 1 tablet (5 mg total) by mouth 2 (two) times daily., Disp: 180 tablet, Rfl: 3 .  atorvastatin (LIPITOR) 80 MG tablet, TAKE 1 TABLET BY MOUTH  DAILY, Disp: 90 tablet, Rfl: 3 .  brimonidine (ALPHAGAN) 0.15 % ophthalmic solution, 1 drop 3 (three) times daily., Disp: , Rfl:  .  Cholecalciferol (VITAMIN D) 50 MCG (2000 UT) tablet, Take 2,000 Units by mouth 2 (two) times daily., Disp: , Rfl:  .  diphenhydrAMINE (BENADRYL) 25 MG tablet, Take 25-50 mg by mouth at bedtime as needed for sleep. , Disp: , Rfl:  .  dofetilide (TIKOSYN) 500 MCG capsule, TAKE 1 CAPSULE BY MOUTH  TWICE DAILY (Patient taking differently: Take 500 mcg by mouth 2 (two) times daily. ), Disp: 180 capsule, Rfl: 3 .  famotidine (PEPCID) 10 MG tablet, Take 10 mg by mouth as needed for heartburn. , Disp: , Rfl:  .  fexofenadine (ALLEGRA) 180 MG tablet, Take 180 mg by mouth daily as needed for allergies. , Disp: , Rfl:  .  fish oil-omega-3 fatty acids 1000 MG capsule, Take 1 g by mouth 2 (two) times daily. , Disp: , Rfl:  .  fluticasone (FLONASE) 50 MCG/ACT nasal spray, SPRAY 2 SPRAYS IN EACH NOSTRIL DAILY AS NEEDED FOR ALLERGIES, Disp: 48 mL, Rfl: PRN .  KLOR-CON M20 20 MEQ tablet, TAKE 1 TABLET BY MOUTH EVERY DAY, Disp: 90 tablet, Rfl: 3 .  Lancets (ONETOUCH ULTRASOFT) lancets, Twice daily, Disp: 100 each, Rfl: 6 .  losartan (COZAAR) 100 MG tablet, Take 1 tablet (100 mg total) by mouth daily., Disp: 90 tablet, Rfl: 3 .  Magnesium 250 MG TABS, Take 250 mg by mouth daily. Patient takes 250 mg AM and 500 mg PM, Disp: , Rfl:  .  metFORMIN (GLUCOPHAGE-XR) 500 MG 24 hr tablet, TAKE 1 TABLET BY MOUTH IN THE AM, AT NOON AND AT BEDTIME, Disp: 270 tablet, Rfl: 3 .  methocarbamol (ROBAXIN) 500 MG tablet, TAKE 1 TABLET BY MOUTH 4 TIMES A DAY AS NEEDED FOR MUSCLE SPASM (Patient taking differently: Take 500 mg by mouth 4 (four) times daily as needed for muscle spasms. ), Disp: 120 tablet, Rfl: 2 .  MULTIPLE  VITAMIN PO, Take 1 tablet by mouth every evening. , Disp: , Rfl:  .  nitroGLYCERIN (NITROSTAT) 0.4 MG SL tablet, Place 1 tablet (0.4 mg total) under the tongue every 5 (five) minutes as needed for chest pain., Disp: 25 tablet, Rfl: 12 .  ondansetron (ZOFRAN ODT) 4 MG disintegrating tablet, Take 1 tablet (4 mg total) by mouth every 8 (eight) hours as needed for nausea or vomiting., Disp: 24 tablet, Rfl: 0 .  ONETOUCH VERIO test strip, USE AS DIRECTED TWICE A DAY, Disp: 50 strip, Rfl: 12 .  pantoprazole (PROTONIX) 40 MG tablet, Take 40 mg by mouth daily as needed (acid reflux). , Disp: , Rfl:  .  PARoxetine (PAXIL) 20 MG tablet, Take 1qd, Disp: 90 tablet, Rfl: 1 .  tamsulosin (FLOMAX) 0.4 MG CAPS capsule, TAKE 1 CAPSULE BY MOUTH EVERY DAY (Patient taking differently: Take 0.4 mg by mouth daily as needed (repeat kidney stone). ), Disp: 30 capsule, Rfl: 5 .  traZODone (DESYREL) 50 MG tablet, Take 1 tablet (50 mg total) by mouth at bedtime., Disp: 90 tablet, Rfl: 3 .  triamcinolone cream (KENALOG) 0.1 %, Apply 1 application topically 2 (two) times daily. As needed. (Patient taking differently: Apply 1 application topically 2 (two) times daily as needed (irritation). ), Disp: 30 g, Rfl: 0 .  umeclidinium-vilanterol (ANORO ELLIPTA) 62.5-25 MCG/INH AEPB, Inhale 1 puff into the lungs daily., Disp: 60 each, Rfl: 0  EXAM: BP Readings from Last 3 Encounters:  11/27/20 (!) 158/88  11/14/20 (!) 144/66  10/23/20 (!) 166/58    VITALS per patient if applicable:  GENERAL: alert, oriented, appears well and in no acute distress voice appears normal no respiratory distress.  Unable to visualize as camera is blocked audio works. PSYCH/NEURO: pleasant and cooperative, no obvious depression or anxiety, speech and thought processing grossly intact Lab Results  Component Value Date   WBC 7.2 04/25/2020  HGB 14.4 04/25/2020   HCT 43.4 04/25/2020   PLT 199 04/25/2020   GLUCOSE 132 (H) 09/14/2020   CHOL 106  04/25/2020   TRIG 54 04/25/2020   HDL 56 04/25/2020   LDLCALC 37 04/25/2020   ALT 29 09/14/2020   AST 20 09/14/2020   NA 144 09/14/2020   K 4.9 09/14/2020   CL 107 09/14/2020   CREATININE 1.21 (H) 09/14/2020   BUN 32 (H) 09/14/2020   CO2 28 09/14/2020   TSH 1.414 08/27/2019   PSA 1.28 09/14/2020   INR 1.2 08/31/2019   HGBA1C 6.3 (H) 09/14/2020   MICROALBUR 18.7 (H) 02/08/2019    ASSESSMENT AND PLAN:  Discussed the following assessment and plan:    ICD-10-CM   1. Medication management  Z79.899   2. Anxious mood as adjustment reaction  F43.22   3. Breathing problem  R06.9     Counseled.   Expectant management and discussion of plan and treatment with opportunity to ask questions and all were answered. The patient agreed with the plan and demonstrated an understanding of the instructions.  increase to  40 mg per day of Paxil take earlier  In evening to prevent side effect Fu dr Claude Manges team for bp control issues the need to add other medication. rov virtual or other in 3-4 weeks or as  needed  Advised to call back or seek an in-person evaluation if worsening  or having  further concerns .  In interim. Return for 3-4 weeks med check .    Shanon Ace, MD

## 2020-12-01 NOTE — Telephone Encounter (Signed)
Please send in a new glucometer One Touch Verio  machine as requested in his documentation.

## 2020-12-02 ENCOUNTER — Other Ambulatory Visit: Payer: Self-pay | Admitting: Internal Medicine

## 2020-12-04 MED ORDER — ONETOUCH VERIO W/DEVICE KIT: PACK | 1 refills | Status: AC

## 2020-12-05 LAB — BASIC METABOLIC PANEL
BUN/Creatinine Ratio: 19 (ref 10–24)
BUN: 25 mg/dL (ref 8–27)
CO2: 25 mmol/L (ref 20–29)
Calcium: 9.5 mg/dL (ref 8.6–10.2)
Chloride: 105 mmol/L (ref 96–106)
Creatinine, Ser: 1.29 mg/dL — ABNORMAL HIGH (ref 0.76–1.27)
GFR calc Af Amer: 64 mL/min/{1.73_m2} (ref >59)
GFR calc non Af Amer: 55 mL/min/{1.73_m2} — ABNORMAL LOW (ref >59)
Glucose: 128 mg/dL — ABNORMAL HIGH (ref 65–99)
Potassium: 4.5 mmol/L (ref 3.5–5.2)
Sodium: 145 mmol/L — ABNORMAL HIGH (ref 134–144)

## 2020-12-05 MED ORDER — AMLODIPINE BESYLATE 5 MG PO TABS: 5.0000 mg | ORAL_TABLET | Freq: Every day | ORAL | 3 refills | Status: DC

## 2020-12-05 NOTE — Telephone Encounter (Signed)
Add amlodipine 5 mg daily  [name]

## 2020-12-09 ENCOUNTER — Other Ambulatory Visit: Payer: Self-pay | Admitting: Internal Medicine

## 2020-12-29 ENCOUNTER — Other Ambulatory Visit: Payer: Self-pay | Admitting: Pulmonary Disease

## 2021-01-23 ENCOUNTER — Other Ambulatory Visit: Payer: Self-pay

## 2021-01-23 NOTE — Progress Notes (Signed)
Chief Complaint  Patient presents with  . Follow-up    6 mo/SOB    HPI: [name] [name]. 70 y.o. come in for Chronic disease management   Breathing [name] [name]   Went to 40 mg paxil  At last time  Helped   "wonderful" and then Jan 18 got  Deep breathing again  usually Mid 90s  ? Walking around andthen able to  relax.   On cpap.  Resurfaced some   Goes out to lunch every day w friends  and doesn't happen. Thinks  This is anxiety and  not copd .  BP has been up  150 and then 465 som  Diastolic is better 70 range  ;losartan and amlodipine added  DM: Am glucometer    Ave 115  120 . To go to [name] [name]  For eye check  Dec visual fields   ROS: See pertinent positives and negatives per HPI. No bleeding .   Past Medical History:  Diagnosis Date  . Atrial fibrillation ([place]) 03/22/2009   a. s/p multiple DCCV; b. no coumadin due to low TE risk profile; c. Tikosyn Rx  . Coronary atherosclerosis of native coronary artery 11/2002   a. s/p stent to LAD 12/03; OM2 occluded at cath 12/03; d. myoview 5/10: no ischemia;  e. echo 7/11: EF 55%, BAE, mild RVE, PASP 41-45; Myoview was in March 2013. There was no ischemia or infarction, EF 51%   . Cutaneous abscess of back excluding buttocks 07/04/2014   Appears to stem from possibly a cyst very large area 6 cm contact surgeon office   . Diabetes mellitus without complication ([place])   . Drusen body    see opth note  . Epiglottitis    w emergency nt intubation  . ERECTILE DYSFUNCTION 03/22/2009  . GERD 03/22/2009  . HYPERGLYCEMIA 04/25/2010  . HYPERLIPIDEMIA 03/22/2009  . 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 LATERAL EPICONDYLITIS, LEFT 10/24/2009  . LIVER FUNCTION TESTS, ABNORMAL 04/25/2010  . Local reaction to immunization 05/05/2012   minor resolving  zostavax   . Myocardial infarction ([place]) mi2003  . Numbness in left leg    foot related to back disease and surgery  . Obesity, unspecified 04/24/2009  . Perforated  appendicitis with necrosis s/p open appendectomy 06/07/14 06/04/2014  . Renal cyst    Characterized by MRI as simple  . Ruptured suppurative appendicitis    2015   . SLEEP APNEA, OBSTRUCTIVE 03/22/2009   compliant with CPAP  . THROMBOCYTOPENIA 08/16/2010  . TOBACCO USE, QUIT 10/24/2009  . 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 Umbilical hernia     Family History  Problem Relation Age of Onset  . Thyroid disease Mother   . Ovarian cancer Mother   . Breast cancer Mother   . Lung cancer Father   . Cancer Father     Social History   Socioeconomic History  . Marital status: Married    Spouse name: Not on file  . Number of children: Not on file  . Years of education: Not on file  . Highest education level: Not on file  Occupational History  . Not on file  Tobacco Use  . Smoking status: Former Smoker    Packs/day: 2.00    Years: 42.00    Pack years: 84.00    Types: Cigarettes    Quit date: 12/30/2008    Years since quitting: 12.0  . Smokeless tobacco: Never Used  .  Tobacco comment: started at age 79; 1-2 ppd; quit 2010  Vaping Use  . Vaping Use: Never used  Substance and Sexual Activity  . Alcohol use: No    Alcohol/week: 0.0 standard drinks    Comment: rarely; maybe 1 beer a year  . Drug use: No  . Sexual activity: Yes  Other Topics Concern  . Not on file  Social History Narrative   Retired paramedic   Regular exercise-yes not recently    Has children   Wife is overweight and has fibromyalgia and depression on disability doesn't go out much. Back surgery    Has older dog   Retired from [employer] 31 years of service now 7 years    Caffeine- minimal   Social Determinants of Radio broadcast assistant Strain: Not on file  Food Insecurity: Not on file  Transportation Needs: No Transportation Needs  . Lack of Transportation (Medical): No  . Lack of Transportation (Non-Medical): No  Physical Activity: Inactive  . Days of Exercise per Week: 0 days  . Minutes of Exercise per  Session: 0 min  Stress: Not on file  Social Connections: Not on file    Outpatient Medications Prior to Visit  Medication Sig Dispense Refill  . acetaminophen (TYLENOL) 325 MG tablet Take 650 mg by mouth every 6 (six) hours as needed for mild pain.    [name] albuterol (PROVENTIL HFA;VENTOLIN HFA) 108 (90 Base) MCG/ACT inhaler Inhale 2 puffs into the lungs every 6 (six) hours as needed for wheezing or shortness of breath. (Patient taking differently: Inhale 1 puff into the lungs every 6 (six) hours as needed for wheezing or shortness of breath. ) 1 Inhaler 0  . ALPHAGAN P 0.1 % SOLN Place 1 drop into both eyes 3 (three) times daily.   3  . ALPRAZolam (XANAX) 0.5 MG tablet TAKE 1 TABLET BY MOUTH 2 TIMES DAILY AS NEEDED FOR ANXIETY. 24 tablet 0  . amLODipine (NORVASC) 5 MG tablet Take 1 tablet (5 mg total) by mouth daily. 90 tablet 3  . ANORO ELLIPTA 62.5-25 MCG/INH AEPB TAKE 1 PUFF BY MOUTH EVERY DAY 60 each 3  . apixaban (ELIQUIS) 5 MG TABS tablet Take 1 tablet (5 mg total) by mouth 2 (two) times daily. 180 tablet 3  . atorvastatin (LIPITOR) 80 MG tablet TAKE 1 TABLET BY MOUTH  DAILY 90 tablet 3  . Blood Glucose Monitoring Suppl (ONETOUCH VERIO) w/Device KIT Use to test blood sugars 1-2 times daily. 1 kit 1  . brimonidine (ALPHAGAN) 0.15 % ophthalmic solution 1 drop 3 (three) times daily.    . Cholecalciferol (VITAMIN D) 50 MCG (2000 UT) tablet Take 2,000 Units by mouth 2 (two) times daily.    . diphenhydrAMINE (BENADRYL) 25 MG tablet Take 25-50 mg by mouth at bedtime as needed for sleep.     [name] dofetilide (TIKOSYN) 500 MCG capsule TAKE 1 CAPSULE BY MOUTH  TWICE DAILY (Patient taking differently: Take 500 mcg by mouth 2 (two) times daily. ) 180 capsule 3  . famotidine (PEPCID) 10 MG tablet Take 10 mg by mouth as needed for heartburn.     . fexofenadine (ALLEGRA) 180 MG tablet Take 180 mg by mouth daily as needed for allergies.     . fish oil-omega-3 fatty acids 1000 MG capsule Take 1 g by mouth 2  (two) times daily.     . fluticasone (FLONASE) 50 MCG/ACT nasal spray SPRAY 2 SPRAYS IN EACH NOSTRIL DAILY AS NEEDED FOR ALLERGIES 48 mL PRN  .  KLOR-CON M20 20 MEQ tablet TAKE 1 TABLET BY MOUTH EVERY DAY 90 tablet 3  . Lancets (ONETOUCH ULTRASOFT) lancets Twice daily 100 each 6  . losartan (COZAAR) 100 MG tablet Take 1 tablet (100 mg total) by mouth daily. 90 tablet 3  . Magnesium 250 MG TABS Take 250 mg by mouth daily. Patient takes 250 mg AM and 500 mg PM    . metFORMIN (GLUCOPHAGE-XR) 500 MG 24 hr tablet TAKE 1 TABLET BY MOUTH IN THE AM, AT NOON AND AT BEDTIME 270 tablet 3  . methocarbamol (ROBAXIN) 500 MG tablet TAKE 1 TABLET BY MOUTH 4 TIMES A DAY AS NEEDED FOR MUSCLE SPASM (Patient taking differently: Take 500 mg by mouth 4 (four) times daily as needed for muscle spasms. ) 120 tablet 2  . MULTIPLE VITAMIN PO Take 1 tablet by mouth every evening.     . nitroGLYCERIN (NITROSTAT) 0.4 MG SL tablet Place 1 tablet (0.4 mg total) under the tongue every 5 (five) minutes as needed for chest pain. 25 tablet 12  . ondansetron (ZOFRAN ODT) 4 MG disintegrating tablet Take 1 tablet (4 mg total) by mouth every 8 (eight) hours as needed for nausea or vomiting. 24 tablet 0  . ONETOUCH VERIO test strip USE AS DIRECTED TWICE A DAY 50 strip 12  . pantoprazole (PROTONIX) 40 MG tablet Take 40 mg by mouth daily as needed (acid reflux).     [name] PARoxetine (PAXIL) 20 MG tablet Take 2 tablets (40 mg total) by mouth daily. Dosage change 180 tablet 1  . tamsulosin (FLOMAX) 0.4 MG CAPS capsule TAKE 1 CAPSULE BY MOUTH EVERY DAY (Patient taking differently: Take 0.4 mg by mouth daily as needed (repeat kidney stone). ) 30 capsule 5  . traZODone (DESYREL) 50 MG tablet Take 1 tablet (50 mg total) by mouth at bedtime. 90 tablet 3  . triamcinolone cream (KENALOG) 0.1 % Apply 1 application topically 2 (two) times daily. As needed. (Patient taking differently: Apply 1 application topically 2 (two) times daily as needed (irritation).  ) 30 g 0   No facility-administered medications prior to visit.     EXAM:  BP (!) 150/50   Pulse 68   Temp 97.8 F (36.6 C) (Oral)   Ht 6' 1"  (1.854 m)   Wt (!) 303 lb 9.6 oz (137.7 kg)   SpO2 95%   BMI 40.06 kg/m   Body mass index is 40.06 kg/m. Wt Readings from Last 3 Encounters:  01/24/21 (!) 303 lb 9.6 oz (137.7 kg)  11/27/20 299 lb 3.2 oz (135.7 kg)  11/14/20 290 lb 9.6 oz (131.8 kg)    GENERAL: vitals reviewed and listed above, alert, oriented, appears well hydrated and in no acute distress HEENT: atraumatic, conjunctiva  clear, no obvious abnormalities on inspection of external nose and ears OP :masked NECK: no obvious masses on inspection palpation  LUNGS: clear to auscultation bilaterally, no wheezes, rales or rhonchi,  CV: HRRR, no clubbing cyanosis  nl cap refill  MS: moves all extremities without noticeable focal  abnormality PSYCH: pleasant and cooperative, no obvious depression or anxiety Lab Results  Component Value Date   WBC 7.2 04/25/2020   HGB 14.4 04/25/2020   HCT 43.4 04/25/2020   PLT 199 04/25/2020   GLUCOSE 128 (H) 12/04/2020   CHOL 106 04/25/2020   TRIG 54 04/25/2020   HDL 56 04/25/2020   LDLCALC 37 04/25/2020   ALT 29 09/14/2020   AST 20 09/14/2020   NA 145 (H) 12/04/2020  K 4.5 12/04/2020   CL 105 12/04/2020   CREATININE 1.29 (H) 12/04/2020   BUN 25 12/04/2020   CO2 25 12/04/2020   TSH 1.414 08/27/2019   PSA 1.28 09/14/2020   INR 1.2 08/31/2019   HGBA1C 6.2 (A) 01/24/2021   MICROALBUR 18.7 (H) 02/08/2019   BP Readings from Last 3 Encounters:  01/24/21 (!) 150/50  11/27/20 (!) 158/88  11/14/20 (!) 144/66    ASSESSMENT AND PLAN:  Discussed the following assessment and plan:  Anxious mood as adjustment reaction  Medication management  Type 2 diabetes mellitus with other circulatory complications (HCC)  Hypertension, unspecified type  Breathing problem  Diabetes mellitus without complication ([facility]) - Plan: POCT HgB  A1C  Hypertension associated with diabetes [name]) Date  Diabetic  eye exam  Was Monday Jan 22 2021  Diabetes well controlled  BP not at goal will share with Dr Stanford Breed but advise get the weight back down could be helpful. Sob sighing pat feels  Is certainly from anxiety  Based on context and how short lived and ok when distracted . Observe if  persistent or progressive consider inc dosing paxil to 60 mg .  Has used ocass alprazolam with help .  Plan ROV 3-4 mos virtual or  In person or as needed .  -Patient advised to return or notify health care team  if  new concerns arise.  Patient Instructions  Diabetes is good. Control.  Monitor and  Let me know about the anxiety dyspnea  And we can consider  Increasing the paxil to 60 mg range .   Will share information  w Dr Stanford Breed   Plan ROV med check in  3 -4 mos  Virtual or  In person on      Corine Solorio K. Kennis Buell M.D.

## 2021-01-24 ENCOUNTER — Ambulatory Visit (INDEPENDENT_AMBULATORY_CARE_PROVIDER_SITE_OTHER): Payer: Medicare Other | Admitting: Internal Medicine

## 2021-01-24 ENCOUNTER — Encounter: Payer: Self-pay | Admitting: Internal Medicine

## 2021-01-24 VITALS — BP 150/50 | HR 68 | Temp 97.8°F | Ht 73.0 in | Wt 303.6 lb

## 2021-01-24 DIAGNOSIS — I10 Essential (primary) hypertension: Secondary | ICD-10-CM | POA: Diagnosis not present

## 2021-01-24 DIAGNOSIS — F4322 Adjustment disorder with anxiety: Secondary | ICD-10-CM | POA: Diagnosis not present

## 2021-01-24 DIAGNOSIS — Z79899 Other long term (current) drug therapy: Secondary | ICD-10-CM | POA: Diagnosis not present

## 2021-01-24 DIAGNOSIS — E1159 Type 2 diabetes mellitus with other circulatory complications: Secondary | ICD-10-CM

## 2021-01-24 DIAGNOSIS — I152 Hypertension secondary to endocrine disorders: Secondary | ICD-10-CM

## 2021-01-24 DIAGNOSIS — E119 Type 2 diabetes mellitus without complications: Secondary | ICD-10-CM | POA: Diagnosis not present

## 2021-01-24 DIAGNOSIS — R069 Unspecified abnormalities of breathing: Secondary | ICD-10-CM

## 2021-01-24 LAB — POCT GLYCOSYLATED HEMOGLOBIN (HGB A1C): Hemoglobin A1C: 6.2 % — AB (ref 4.0–5.6)

## 2021-01-24 NOTE — Patient Instructions (Signed)
Diabetes is good. Control.  Monitor and  Let me know about the anxiety dyspnea  And we can consider  Increasing the paxil to 60 mg range .   Will share information  w Dr Stanford Breed   Plan ROV med check in  3 -4 mos  Virtual or  In person on

## 2021-01-25 ENCOUNTER — Other Ambulatory Visit: Payer: Self-pay | Admitting: Internal Medicine

## 2021-02-01 ENCOUNTER — Other Ambulatory Visit: Payer: Self-pay | Admitting: Cardiology

## 2021-02-26 ENCOUNTER — Telehealth: Payer: Self-pay | Admitting: Internal Medicine

## 2021-02-26 NOTE — Telephone Encounter (Signed)
Left message for patient to call back and schedule Medicare Annual Wellness Visit (AWV) either virtually or in office. No detailed message left   Last AWV 08/11/18 please schedule at anytime with [facility] Nurse Health Advisor 1 or 2   This should be a 45 minute visit.

## 2021-02-27 NOTE — Telephone Encounter (Signed)
Pt is calling in stating that he will give [place] a call back when he is ready to schedule.

## 2021-03-02 MED ORDER — AMLODIPINE BESYLATE 10 MG PO TABS: 10.0000 mg | ORAL_TABLET | Freq: Every day | ORAL | 3 refills | Status: DC

## 2021-03-02 NOTE — Telephone Encounter (Signed)
Increase amlodipine to 10 mg daily  [name]   New script sent to the pharmacy

## 2021-03-16 MED ORDER — HYDRALAZINE HCL 50 MG PO TABS: 50.0000 mg | ORAL_TABLET | Freq: Two times a day (BID) | ORAL | 3 refills | Status: DC

## 2021-03-16 NOTE — Telephone Encounter (Signed)
DC amlodopine; hydralazine 50 mg BID and follow BP.  [name]

## 2021-03-16 NOTE — Telephone Encounter (Signed)
Spoke with pt, Aware of dr crenshaw's recommendations. New script sent to the pharmacy  

## 2021-03-31 ENCOUNTER — Other Ambulatory Visit: Payer: Self-pay | Admitting: Internal Medicine

## 2021-04-20 NOTE — Progress Notes (Signed)
HPI: FU hx of CAD s/p stent to LAD in 2003, parox AFib, HL, OSA. He has seen Dr. Rayann Heman and ablation could be considered if he lost weight. Previous rash with cardizem; on tikosyn. Has had previous cardioversions for atrial fibrillation. Carotid Dopplers June 2019 showed 1 to 39% bilateral stenosis. Echocardiogram August 2020 showed normal LV function, mild left atrial enlargement and trace aortic insufficiency.Abdominal ultrasound March 2021 showed no aneurysm. The right and left iliac arteries were dilated (followed by vascular surgery). Chest CT June 2021 showed stable 4.4 cm ascending thoracic aortic aneurysm.Since last seen,  he has some dyspnea on exertion, no orthopnea, PND, pedal edema or chest pain.  No syncope.  No episodes of atrial fibrillation.  Current Outpatient Medications  Medication Sig Dispense Refill  . acetaminophen (TYLENOL) 325 MG tablet Take 650 mg by mouth every 6 (six) hours as needed for mild pain.    [name] albuterol (PROVENTIL HFA;VENTOLIN HFA) 108 (90 Base) MCG/ACT inhaler Inhale 2 puffs into the lungs every 6 (six) hours as needed for wheezing or shortness of breath. 1 Inhaler 0  . ALPHAGAN P 0.1 % SOLN Place 1 drop into both eyes 3 (three) times daily.   3  . ALPRAZolam (XANAX) 0.5 MG tablet TAKE 1 TABLET BY MOUTH 2 TIMES DAILY AS NEEDED FOR ANXIETY. 24 tablet 0  . ANORO ELLIPTA 62.5-25 MCG/INH AEPB INHALE 1 PUFF BY MOUTH EVERY DAY 60 each 5  . apixaban (ELIQUIS) 5 MG TABS tablet Take 1 tablet (5 mg total) by mouth 2 (two) times daily. 180 tablet 3  . atorvastatin (LIPITOR) 80 MG tablet TAKE 1 TABLET BY MOUTH  DAILY 90 tablet 3  . Blood Glucose Monitoring Suppl (ONETOUCH VERIO) w/Device KIT Use to test blood sugars 1-2 times daily. 1 kit 1  . brimonidine (ALPHAGAN) 0.15 % ophthalmic solution 1 drop 3 (three) times daily.    . Cholecalciferol (VITAMIN D) 50 MCG (2000 UT) tablet Take 2,000 Units by mouth 2 (two) times daily.    . diphenhydrAMINE (BENADRYL) 25 MG  tablet Take 25-50 mg by mouth at bedtime as needed for sleep.     [name] dofetilide (TIKOSYN) 500 MCG capsule TAKE 1 CAPSULE BY MOUTH  TWICE DAILY 180 capsule 3  . famotidine (PEPCID) 10 MG tablet Take 10 mg by mouth as needed for heartburn.     . fexofenadine (ALLEGRA) 180 MG tablet Take 180 mg by mouth daily as needed for allergies.     . fish oil-omega-3 fatty acids 1000 MG capsule Take 1 g by mouth 2 (two) times daily.    . fluticasone (FLONASE) 50 MCG/ACT nasal spray SPRAY 2 SPRAYS IN EACH NOSTRIL DAILY AS NEEDED FOR ALLERGIES 48 mL PRN  . hydrALAZINE (APRESOLINE) 50 MG tablet Take 1 tablet (50 mg total) by mouth 2 (two) times daily. 180 tablet 3  . KLOR-CON M20 20 MEQ tablet TAKE 1 TABLET BY MOUTH EVERY DAY 90 tablet 3  . Lancets (ONETOUCH ULTRASOFT) lancets Twice daily 100 each 6  . losartan (COZAAR) 100 MG tablet Take 1 tablet (100 mg total) by mouth daily. 90 tablet 3  . Magnesium 250 MG TABS Take 250 mg by mouth daily. Patient takes 250 mg AM and 500 mg PM    . metFORMIN (GLUCOPHAGE-XR) 500 MG 24 hr tablet TAKE 1 TABLET BY MOUTH IN THE AM, AT NOON AND AT BEDTIME 270 tablet 3  . methocarbamol (ROBAXIN) 500 MG tablet TAKE 1 TABLET BY MOUTH 4 TIMES  A DAY AS NEEDED FOR MUSCLE SPASM 120 tablet 2  . MULTIPLE VITAMIN PO Take 1 tablet by mouth every evening.    . nitroGLYCERIN (NITROSTAT) 0.4 MG SL tablet Place 1 tablet (0.4 mg total) under the tongue every 5 (five) minutes as needed for chest pain. 25 tablet 12  . ondansetron (ZOFRAN ODT) 4 MG disintegrating tablet Take 1 tablet (4 mg total) by mouth every 8 (eight) hours as needed for nausea or vomiting. 24 tablet 0  . ONETOUCH VERIO test strip USE AS DIRECTED TWICE A DAY 50 strip 12  . pantoprazole (PROTONIX) 40 MG tablet Take 40 mg by mouth daily as needed (acid reflux).     [name] PARoxetine (PAXIL) 20 MG tablet Take 2.5 tablets (50 mg total) by mouth daily. 90 tablet 1  . tamsulosin (FLOMAX) 0.4 MG CAPS capsule TAKE 1 CAPSULE BY MOUTH EVERY DAY  (Patient taking differently: Take 0.4 mg by mouth daily as needed (repeat kidney stone).) 30 capsule 5  . traZODone (DESYREL) 50 MG tablet Take 1 tablet (50 mg total) by mouth at bedtime. 90 tablet 3  . triamcinolone cream (KENALOG) 0.1 % Apply 1 application topically 2 (two) times daily. As needed. (Patient taking differently: Apply 1 application topically 2 (two) times daily as needed (irritation).) 30 g 0   No current facility-administered medications for this visit.     Past Medical History:  Diagnosis Date  . Atrial fibrillation ([place]) 03/22/2009   a. s/p multiple DCCV; b. no coumadin due to low TE risk profile; c. Tikosyn Rx  . Coronary atherosclerosis of native coronary artery 11/2002   a. s/p stent to LAD 12/03; OM2 occluded at cath 12/03; d. myoview 5/10: no ischemia;  e. echo 7/11: EF 55%, BAE, mild RVE, PASP 41-45; Myoview was in March 2013. There was no ischemia or infarction, EF 51%   . Cutaneous abscess of back excluding buttocks 07/04/2014   Appears to stem from possibly a cyst very large area 6 cm contact surgeon office   . Diabetes mellitus without complication ([place])   . Drusen body    see opth note  . Epiglottitis    w emergency nt intubation  . ERECTILE DYSFUNCTION 03/22/2009  . GERD 03/22/2009  . HYPERGLYCEMIA 04/25/2010  . HYPERLIPIDEMIA 03/22/2009  . 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 LATERAL EPICONDYLITIS, LEFT 10/24/2009  . LIVER FUNCTION TESTS, ABNORMAL 04/25/2010  . Local reaction to immunization 05/05/2012   minor resolving  zostavax   . Myocardial infarction ([place]) mi2003  . Numbness in left leg    foot related to back disease and surgery  . Obesity, unspecified 04/24/2009  . Perforated appendicitis with necrosis s/p open appendectomy 06/07/14 06/04/2014  . Renal cyst    Characterized by MRI as simple  . Ruptured suppurative appendicitis    2015   . SLEEP APNEA, OBSTRUCTIVE 03/22/2009   compliant with CPAP  . THROMBOCYTOPENIA  08/16/2010  . TOBACCO USE, QUIT 10/24/2009  . 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 Umbilical hernia     Past Surgical History:  Procedure Laterality Date  . APPENDECTOMY N/A 06/06/2014   Procedure: APPENDECTOMY;  Surgeon: Odis Hollingshead, MD;  Location: [facility];  Service: General;  Laterality: N/A;  . COLON SURGERY    . COLONOSCOPY  11/15/2011   Procedure: COLONOSCOPY;  Surgeon: Juanita Craver, MD;  Location: [facility];  Service: Endoscopy;  Laterality: N/A;  . COLONOSCOPY WITH PROPOFOL N/A 01/03/2017  Procedure: COLONOSCOPY WITH PROPOFOL;  Surgeon: Carol Ada, MD;  Location: [facility];  Service: Endoscopy;  Laterality: N/A;  . COLONOSCOPY WITH PROPOFOL N/A 05/26/2020   Procedure: COLONOSCOPY WITH PROPOFOL;  Surgeon: Carol Ada, MD;  Location: [facility];  Service: Endoscopy;  Laterality: N/A;  . cyst on epiglottis  08/2002  . ESOPHAGOGASTRODUODENOSCOPY  11/15/2011   Procedure: ESOPHAGOGASTRODUODENOSCOPY (EGD);  Surgeon: Juanita Craver, MD;  Location: [facility];  Service: Endoscopy;  Laterality: N/A;  . LAPAROSCOPIC APPENDECTOMY N/A 06/06/2014   Procedure: APPENDECTOMY LAPAROSCOPIC attemted;  Surgeon: Odis Hollingshead, MD;  Location: [facility];  Service: General;  Laterality: N/A;  . LUMBAR DISC SURGERY     two  holes in spinalcovering  . POLYPECTOMY  05/26/2020   Procedure: POLYPECTOMY;  Surgeon: Carol Ada, MD;  Location: [facility];  Service: Endoscopy;;  . stent     LAD DUS 2004  . STERNAL WOUND DEBRIDEMENT Left 08/31/2019   Procedure: LEFT STERNOCLAVICULAR WOUND DEBRIDEMENT WITH APPLICATION OF WOUND VAC;  Surgeon: Grace Isaac, MD;  Location: [facility];  Service: Thoracic;  Laterality: Left;  . surgery l4-l5   1998   ruptured x 3  . ulnar neuropathy    . UMBILICAL HERNIA REPAIR     mesh    Social History   Socioeconomic History  . Marital status: Married    Spouse name: Not on file  . Number of children: Not on file  . Years of education: Not on file  . Highest  education level: Not on file  Occupational History  . Not on file  Tobacco Use  . Smoking status: Former Smoker    Packs/day: 2.00    Years: 42.00    Pack years: 84.00    Types: Cigarettes    Quit date: 12/30/2008    Years since quitting: 12.3  . Smokeless tobacco: Never Used  . Tobacco comment: started at age 49; 1-2 ppd; quit 2010  Vaping Use  . Vaping Use: Never used  Substance and Sexual Activity  . Alcohol use: No    Alcohol/week: 0.0 standard drinks    Comment: rarely; maybe 1 beer a year  . Drug use: No  . Sexual activity: Yes  Other Topics Concern  . Not on file  Social History Narrative   Retired paramedic   Regular exercise-yes not recently    Has children   Wife is overweight and has fibromyalgia and depression on disability doesn't go out much. Back surgery    Has older dog   Retired from [employer] 31 years of service now 7 years    Caffeine- minimal   Social Determinants of Radio broadcast assistant Strain: Not on file  Food Insecurity: Not on file  Transportation Needs: No Transportation Needs  . Lack of Transportation (Medical): No  . Lack of Transportation (Non-Medical): No  Physical Activity: Inactive  . Days of Exercise per Week: 0 days  . Minutes of Exercise per Session: 0 min  Stress: Not on file  Social Connections: Not on file  Intimate Partner Violence: Not on file    Family History  Problem Relation Age of Onset  . Thyroid disease Mother   . Ovarian cancer Mother   . Breast cancer Mother   . Lung cancer Father   . Cancer Father     ROS: no fevers or chills, productive cough, hemoptysis, dysphasia, odynophagia, melena, hematochezia, dysuria, hematuria, rash, seizure activity, orthopnea, PND, pedal edema, claudication. Remaining systems are negative.  Physical Exam: Well-developed well-nourished  in no acute distress.  Skin is warm and dry.  HEENT is normal.  Neck is supple.  Chest is clear to auscultation with normal expansion.   Cardiovascular exam is regular rate and rhythm.  2/6 systolic murmur left sternal border radiating to carotids. Abdominal exam nontender or distended. No masses palpated. Extremities show trace edema. neuro grossly intact  ECG-him at a rate of 63, first-degree AV block, no ST changes.  Personally reviewed  A/P  1 paroxysmal atrial fibrillation-patient remains in sinus rhythm today.  Continue Tikosyn and apixaban.  Check hemoglobin, renal function and magnesium.  Check hemoglobin.  2 history of thoracic aortic aneurysm-patient will need follow-up CTA June 2022.  3 coronary artery disease-patient doing well with no chest pain.  Continue statin.  No aspirin given need for anticoagulation.  4 hyperlipidemia-continue statin.  Check lipids and liver.  5 iliac aneurysm-patient is followed by vascular surgery.  6 morbid obesity-we discussed the importance of weight loss.  7 murmur-possible aortic stenosis murmur on examination.  We will plan echocardiogram to further assess.  [name], MD

## 2021-04-23 ENCOUNTER — Other Ambulatory Visit: Payer: Self-pay

## 2021-04-23 NOTE — Progress Notes (Signed)
Chief Complaint  Patient presents with  . Medication Management  . Follow-up  . Diabetes    HPI: [name] [name]. 70 y.o. come in for Chronic disease management  Seen Jan 2022    Thinks his anxiety is continuing to creep up.  Thinks it has PTSD because does not occur when he is busy with friends or something else.  Can occur after exertion but just sitting around where he feels tightness under his diaphragm and feels panicked.  paxil   40     Wants to inc dosage thinks  Ptsd.      As needed alprazolam   Has needed recently  Helps.  When he does take it.  BG has been stable.  BP often 120s to sometimes 160.  His medication was changed from amlodipine to hydralazine by cardiology because of the side effects of swelling in his feet.  Sleeps on    3 pillows  No new symptoms otherwise ROS: See pertinent positives and negatives per HPI.  Past Medical History:  Diagnosis Date  . Atrial fibrillation ([place]) 03/22/2009   a. s/p multiple DCCV; b. no coumadin due to low TE risk profile; c. Tikosyn Rx  . Coronary atherosclerosis of native coronary artery 11/2002   a. s/p stent to LAD 12/03; OM2 occluded at cath 12/03; d. myoview 5/10: no ischemia;  e. echo 7/11: EF 55%, BAE, mild RVE, PASP 41-45; Myoview was in March 2013. There was no ischemia or infarction, EF 51%   . Cutaneous abscess of back excluding buttocks 07/04/2014   Appears to stem from possibly a cyst very large area 6 cm contact surgeon office   . Diabetes mellitus without complication ([place])   . Drusen body    see opth note  . Epiglottitis    w emergency nt intubation  . ERECTILE DYSFUNCTION 03/22/2009  . GERD 03/22/2009  . HYPERGLYCEMIA 04/25/2010  . HYPERLIPIDEMIA 03/22/2009  . 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 LATERAL EPICONDYLITIS, LEFT 10/24/2009  . LIVER FUNCTION TESTS, ABNORMAL 04/25/2010  . Local reaction to immunization 05/05/2012   minor resolving  zostavax   . Myocardial  infarction ([place]) mi2003  . Numbness in left leg    foot related to back disease and surgery  . Obesity, unspecified 04/24/2009  . Perforated appendicitis with necrosis s/p open appendectomy 06/07/14 06/04/2014  . Renal cyst    Characterized by MRI as simple  . Ruptured suppurative appendicitis    2015   . SLEEP APNEA, OBSTRUCTIVE 03/22/2009   compliant with CPAP  . THROMBOCYTOPENIA 08/16/2010  . TOBACCO USE, QUIT 10/24/2009  . 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 Umbilical hernia     Family History  Problem Relation Age of Onset  . Thyroid disease Mother   . Ovarian cancer Mother   . Breast cancer Mother   . Lung cancer Father   . Cancer Father     Social History   Socioeconomic History  . Marital status: Married    Spouse name: Not on file  . Number of children: Not on file  . Years of education: Not on file  . Highest education level: Not on file  Occupational History  . Not on file  Tobacco Use  . Smoking status: Former Smoker    Packs/day: 2.00    Years: 42.00    Pack years: 84.00    Types: Cigarettes    Quit date: 12/30/2008  Years since quitting: 12.3  . Smokeless tobacco: Never Used  . Tobacco comment: started at age 42; 1-2 ppd; quit 2010  Vaping Use  . Vaping Use: Never used  Substance and Sexual Activity  . Alcohol use: No    Alcohol/week: 0.0 standard drinks    Comment: rarely; maybe 1 beer a year  . Drug use: No  . Sexual activity: Yes  Other Topics Concern  . Not on file  Social History Narrative   Retired paramedic   Regular exercise-yes not recently    Has children   Wife is overweight and has fibromyalgia and depression on disability doesn't go out much. Back surgery    Has older dog   Retired from [employer] 31 years of service now 7 years    Caffeine- minimal   Social Determinants of Radio broadcast assistant Strain: Not on file  Food Insecurity: Not on file  Transportation Needs: No Transportation Needs  . Lack of Transportation (Medical):  No  . Lack of Transportation (Non-Medical): No  Physical Activity: Inactive  . Days of Exercise per Week: 0 days  . Minutes of Exercise per Session: 0 min  Stress: Not on file  Social Connections: Not on file    Outpatient Medications Prior to Visit  Medication Sig Dispense Refill  . acetaminophen (TYLENOL) 325 MG tablet Take 650 mg by mouth every 6 (six) hours as needed for mild pain.    [name] albuterol (PROVENTIL HFA;VENTOLIN HFA) 108 (90 Base) MCG/ACT inhaler Inhale 2 puffs into the lungs every 6 (six) hours as needed for wheezing or shortness of breath. (Patient taking differently: Inhale 1 puff into the lungs every 6 (six) hours as needed for wheezing or shortness of breath.) 1 Inhaler 0  . ALPHAGAN P 0.1 % SOLN Place 1 drop into both eyes 3 (three) times daily.   3  . ALPRAZolam (XANAX) 0.5 MG tablet TAKE 1 TABLET BY MOUTH 2 TIMES DAILY AS NEEDED FOR ANXIETY. 24 tablet 0  . ANORO ELLIPTA 62.5-25 MCG/INH AEPB TAKE 1 PUFF BY MOUTH EVERY DAY 60 each 3  . apixaban (ELIQUIS) 5 MG TABS tablet Take 1 tablet (5 mg total) by mouth 2 (two) times daily. 180 tablet 3  . atorvastatin (LIPITOR) 80 MG tablet TAKE 1 TABLET BY MOUTH  DAILY 90 tablet 3  . Blood Glucose Monitoring Suppl (ONETOUCH VERIO) w/Device KIT Use to test blood sugars 1-2 times daily. 1 kit 1  . brimonidine (ALPHAGAN) 0.15 % ophthalmic solution 1 drop 3 (three) times daily.    . Cholecalciferol (VITAMIN D) 50 MCG (2000 UT) tablet Take 2,000 Units by mouth 2 (two) times daily.    . diphenhydrAMINE (BENADRYL) 25 MG tablet Take 25-50 mg by mouth at bedtime as needed for sleep.     [name] dofetilide (TIKOSYN) 500 MCG capsule TAKE 1 CAPSULE BY MOUTH  TWICE DAILY 180 capsule 3  . famotidine (PEPCID) 10 MG tablet Take 10 mg by mouth as needed for heartburn.     . fexofenadine (ALLEGRA) 180 MG tablet Take 180 mg by mouth daily as needed for allergies.     . fish oil-omega-3 fatty acids 1000 MG capsule Take 1 g by mouth 2 (two) times daily.    .  fluticasone (FLONASE) 50 MCG/ACT nasal spray SPRAY 2 SPRAYS IN EACH NOSTRIL DAILY AS NEEDED FOR ALLERGIES 48 mL PRN  . hydrALAZINE (APRESOLINE) 50 MG tablet Take 1 tablet (50 mg total) by mouth 2 (two) times daily. 180 tablet 3  .  KLOR-CON M20 20 MEQ tablet TAKE 1 TABLET BY MOUTH EVERY DAY 90 tablet 3  . Lancets (ONETOUCH ULTRASOFT) lancets Twice daily 100 each 6  . losartan (COZAAR) 100 MG tablet Take 1 tablet (100 mg total) by mouth daily. 90 tablet 3  . Magnesium 250 MG TABS Take 250 mg by mouth daily. Patient takes 250 mg AM and 500 mg PM    . metFORMIN (GLUCOPHAGE-XR) 500 MG 24 hr tablet TAKE 1 TABLET BY MOUTH IN THE AM, AT NOON AND AT BEDTIME 270 tablet 3  . methocarbamol (ROBAXIN) 500 MG tablet TAKE 1 TABLET BY MOUTH 4 TIMES A DAY AS NEEDED FOR MUSCLE SPASM (Patient taking differently: Take 500 mg by mouth 4 (four) times daily as needed for muscle spasms.) 120 tablet 2  . MULTIPLE VITAMIN PO Take 1 tablet by mouth every evening.    . nitroGLYCERIN (NITROSTAT) 0.4 MG SL tablet Place 1 tablet (0.4 mg total) under the tongue every 5 (five) minutes as needed for chest pain. 25 tablet 12  . ondansetron (ZOFRAN ODT) 4 MG disintegrating tablet Take 1 tablet (4 mg total) by mouth every 8 (eight) hours as needed for nausea or vomiting. 24 tablet 0  . ONETOUCH VERIO test strip USE AS DIRECTED TWICE A DAY 50 strip 12  . pantoprazole (PROTONIX) 40 MG tablet Take 40 mg by mouth daily as needed (acid reflux).     . tamsulosin (FLOMAX) 0.4 MG CAPS capsule TAKE 1 CAPSULE BY MOUTH EVERY DAY (Patient taking differently: Take 0.4 mg by mouth daily as needed (repeat kidney stone).) 30 capsule 5  . traZODone (DESYREL) 50 MG tablet Take 1 tablet (50 mg total) by mouth at bedtime. 90 tablet 3  . triamcinolone cream (KENALOG) 0.1 % Apply 1 application topically 2 (two) times daily. As needed. (Patient taking differently: Apply 1 application topically 2 (two) times daily as needed (irritation).) 30 g 0  . PARoxetine  (PAXIL) 20 MG tablet TAKE 1 TABLET BY MOUTH EVERY DAY (Patient taking differently: in the morning and at bedtime.) 90 tablet 1   No facility-administered medications prior to visit.     EXAM:  BP 136/62 (BP Location: Right Arm, Patient Position: Sitting, Cuff Size: Large)   Pulse (!) 56   Temp 97.9 F (36.6 C) (Oral)   Ht _0  (1.854 m)   Wt (!) 306 lb 6.4 oz (139 kg)   SpO2 91%   BMI 40.42 kg/m   Body mass index is 40.42 kg/m. Wt Readings from Last 3 Encounters:  04/24/21 (!) 306 lb 6.4 oz (139 kg)  01/24/21 (!) 303 lb 9.6 oz (137.7 kg)  11/27/20 299 lb 3.2 oz (135.7 kg)   GENERAL: vitals reviewed and listed above, alert, oriented, appears well hydrated and in no acute distress HEENT: atraumatic, conjunctiva  clear, no obvious abnormalities on inspection of external nose and ears OP : masked NECK: no obvious masses on inspection palpation  LUNGS: clear to auscultation bilaterally, no wheezes, rales or rhonchi CV: HRRR, no clubbing cyanosis 1+  peripheral edema nl cap refill  abd no fluid wave  HS megaly noted  MS: moves all extremities without noticeable focal  abnormality PSYCH: pleasant and cooperative, no obvious depression or anxiety Lab Results  Component Value Date   WBC 7.2 04/25/2020   HGB 14.4 04/25/2020   HCT 43.4 04/25/2020   PLT 199 04/25/2020   GLUCOSE 128 (H) 12/04/2020   CHOL 106 04/25/2020   TRIG 54 04/25/2020   HDL 56  04/25/2020   LDLCALC 37 04/25/2020   ALT 29 09/14/2020   AST 20 09/14/2020   NA 145 (H) 12/04/2020   K 4.5 12/04/2020   CL 105 12/04/2020   CREATININE 1.29 (H) 12/04/2020   BUN 25 12/04/2020   CO2 25 12/04/2020   TSH 1.414 08/27/2019   PSA 1.28 09/14/2020   INR 1.2 08/31/2019   HGBA1C 6.4 (A) 04/24/2021   MICROALBUR 18.7 (H) 02/08/2019   BP Readings from Last 3 Encounters:  04/24/21 136/62  01/24/21 (!) 150/50  11/27/20 (!) 158/88    ASSESSMENT AND PLAN:  Discussed the following assessment and plan:  Anxious mood as  adjustment reaction - Plan: Ambulatory referral to Psychiatry, Basic metabolic panel, Lipid panel, Basic metabolic panel, Hepatic function panel, CBC with Differential/Platelet, Hemoglobin A1c  PTSD (post-traumatic stress disorder) ? - Plan: Ambulatory referral to Psychiatry, Basic metabolic panel, Lipid panel, Basic metabolic panel, Hepatic function panel, CBC with Differential/Platelet, Hemoglobin A1c  Medication management - Plan: Ambulatory referral to Psychiatry, Basic metabolic panel, Lipid panel, Basic metabolic panel, Hepatic function panel, CBC with Differential/Platelet, Hemoglobin A1c  Type 2 diabetes mellitus with other circulatory complications ([place]) - Plan: Basic metabolic panel, Lipid panel, Basic metabolic panel, Hepatic function panel, CBC with Differential/Platelet, Hemoglobin A1c, POCT glycosylated hemoglobin (Hb A1C)  Hypertension, unspecified type - Plan: Basic metabolic panel, Lipid panel, Basic metabolic panel, Hepatic function panel, CBC with Differential/Platelet, Hemoglobin A1c  Chronic anticoagulation - Plan: Basic metabolic panel, Lipid panel, Basic metabolic panel, Hepatic function panel, CBC with Differential/Platelet, Hemoglobin A1c  Hyperlipidemia, unspecified hyperlipidemia type - Plan: Basic metabolic panel, Lipid panel, Basic metabolic panel, Hepatic function panel, CBC with Differential/Platelet, Hemoglobin A1c So his episodes of tightness in his diaphragm are felt to be secondary anxiety response to benzo and it comes out of the blue. Is reminiscent of PTSD panic since he started after his hospitalization for serious disease although I am not 100% convinced there is not an underlying trigger that is organic. Things appear stable today okay to increase Paxil to 50 mg a day And I agree referral to psychiatry practice [facility] for prescribing and counseling help. Blood sugars fortunately stable today Plan fasting labs due and CPX in the summer.  -Patient  advised to return or notify health care team  if  new concerns arise.  Patient Instructions  Plan  Fasting lab before cpx in summer .  I agree getting help with ptsd sx.  Increase to  50 mg  paxil for now .( higher dose)  Reach out  For psychiatry, [facility] for  Medication  Evaluation  And  Poss counseling .       Standley Brooking. Jakaden Ouzts M.D.

## 2021-04-24 ENCOUNTER — Ambulatory Visit (INDEPENDENT_AMBULATORY_CARE_PROVIDER_SITE_OTHER): Payer: Medicare Other | Admitting: Internal Medicine

## 2021-04-24 ENCOUNTER — Encounter: Payer: Self-pay | Admitting: Internal Medicine

## 2021-04-24 VITALS — BP 136/62 | HR 56 | Temp 97.9°F | Ht 73.0 in | Wt 306.4 lb

## 2021-04-24 DIAGNOSIS — F4310 Post-traumatic stress disorder, unspecified: Secondary | ICD-10-CM | POA: Diagnosis not present

## 2021-04-24 DIAGNOSIS — Z79899 Other long term (current) drug therapy: Secondary | ICD-10-CM | POA: Diagnosis not present

## 2021-04-24 DIAGNOSIS — Z7901 Long term (current) use of anticoagulants: Secondary | ICD-10-CM

## 2021-04-24 DIAGNOSIS — F4322 Adjustment disorder with anxiety: Secondary | ICD-10-CM

## 2021-04-24 DIAGNOSIS — I10 Essential (primary) hypertension: Secondary | ICD-10-CM

## 2021-04-24 DIAGNOSIS — E785 Hyperlipidemia, unspecified: Secondary | ICD-10-CM

## 2021-04-24 DIAGNOSIS — E1159 Type 2 diabetes mellitus with other circulatory complications: Secondary | ICD-10-CM | POA: Diagnosis not present

## 2021-04-24 LAB — POCT GLYCOSYLATED HEMOGLOBIN (HGB A1C): Hemoglobin A1C: 6.4 % — AB (ref 4.0–5.6)

## 2021-04-24 MED ORDER — PAROXETINE HCL 20 MG PO TABS: 50.0000 mg | ORAL_TABLET | Freq: Every day | ORAL | 1 refills | Status: DC

## 2021-04-24 NOTE — Patient Instructions (Addendum)
Plan  Fasting lab before cpx in summer .  I agree getting help with ptsd sx.  Increase to  50 mg  paxil for now .( higher dose)  Reach out  For psychiatry, [facility] for  Medication  Evaluation  And  Poss counseling .

## 2021-04-26 ENCOUNTER — Other Ambulatory Visit: Payer: Self-pay | Admitting: Pulmonary Disease

## 2021-04-27 ENCOUNTER — Other Ambulatory Visit: Payer: Self-pay

## 2021-04-27 ENCOUNTER — Ambulatory Visit (INDEPENDENT_AMBULATORY_CARE_PROVIDER_SITE_OTHER): Payer: Medicare Other | Admitting: Cardiology

## 2021-04-27 ENCOUNTER — Encounter: Payer: Self-pay | Admitting: Cardiology

## 2021-04-27 VITALS — BP 132/50 | HR 63 | Ht 73.0 in | Wt 306.4 lb

## 2021-04-27 DIAGNOSIS — I480 Paroxysmal atrial fibrillation: Secondary | ICD-10-CM

## 2021-04-27 DIAGNOSIS — I10 Essential (primary) hypertension: Secondary | ICD-10-CM | POA: Diagnosis not present

## 2021-04-27 DIAGNOSIS — I2510 Atherosclerotic heart disease of native coronary artery without angina pectoris: Secondary | ICD-10-CM

## 2021-04-27 DIAGNOSIS — E7800 Pure hypercholesterolemia, unspecified: Secondary | ICD-10-CM | POA: Diagnosis not present

## 2021-04-27 DIAGNOSIS — R011 Cardiac murmur, unspecified: Secondary | ICD-10-CM

## 2021-04-27 MED ORDER — HYDRALAZINE HCL 50 MG PO TABS: 75.0000 mg | ORAL_TABLET | Freq: Two times a day (BID) | ORAL | 3 refills | Status: DC

## 2021-04-27 NOTE — Patient Instructions (Signed)
Medication Instructions:   INCREASE HYDRALAZINE TO 75 MG TWICE DAILY= 1 AND 1/2 OF THE 50 MG TABLET TWICE DAILY  *If you need a refill on your cardiac medications before your next appointment, please call your pharmacy*   Lab Work:  Your physician recommends that you return for lab work FASTING  If you have labs (blood work) drawn today and your tests are completely normal, you will receive your results only by: [name] MyChart Message (if you have MyChart) OR . A paper copy in the mail If you have any lab test that is abnormal or we need to change your treatment, we will call you to review the results.   Testing/Procedures:  Your physician has requested that you have an echocardiogram. Echocardiography is a painless test that uses sound waves to create images of your heart. It provides your doctor with information about the size and shape of your heart and how well your heart's chambers and valves are working. This procedure takes approximately one hour. There are no restrictions for this procedure.[place]     Follow-Up: At [facility], you and your health needs are our priority.  As part of our continuing mission to provide you with exceptional heart care, we have created designated Provider Care Teams.  These Care Teams include your primary Cardiologist (physician) and Advanced Practice Providers (APPs -  Physician Assistants and Nurse Practitioners) who all work together to provide you with the care you need, when you need it.  We recommend signing up for the patient portal called "MyChart".  Sign up information is provided on this After Visit Summary.  MyChart is used to connect with patients for Virtual Visits (Telemedicine).  Patients are able to view lab/test results, encounter notes, upcoming appointments, etc.  Non-urgent messages can be sent to your provider as well.   To learn more about what you can do with MyChart, go to [email or web address].    Your next  appointment:   6 month(s)  The format for your next appointment:   In Person  Provider:   [name], MD

## 2021-04-30 LAB — CBC
Hematocrit: 44.6 % (ref 37.5–51.0)
Hemoglobin: 14.2 g/dL (ref 13.0–17.7)
MCH: 29.8 pg (ref 26.6–33.0)
MCHC: 31.8 g/dL (ref 31.5–35.7)
MCV: 94 fL (ref 79–97)
Platelets: 212 10*3/uL (ref 150–450)
RBC: 4.76 x10E6/uL (ref 4.14–5.80)
RDW: 12.3 % (ref 11.6–15.4)
WBC: 6.9 10*3/uL (ref 3.4–10.8)

## 2021-04-30 LAB — COMPREHENSIVE METABOLIC PANEL
ALT: 53 IU/L — ABNORMAL HIGH (ref 0–44)
AST: 27 IU/L (ref 0–40)
Albumin/Globulin Ratio: 1.8 (ref 1.2–2.2)
Albumin: 4.5 g/dL (ref 3.7–4.7)
Alkaline Phosphatase: 79 IU/L (ref 44–121)
BUN/Creatinine Ratio: 22 (ref 10–24)
BUN: 36 mg/dL — ABNORMAL HIGH (ref 8–27)
Bilirubin Total: 1 mg/dL (ref 0.0–1.2)
CO2: 22 mmol/L (ref 20–29)
Calcium: 9.9 mg/dL (ref 8.6–10.2)
Chloride: 104 mmol/L (ref 96–106)
Creatinine, Ser: 1.63 mg/dL — ABNORMAL HIGH (ref 0.76–1.27)
Globulin, Total: 2.5 g/dL (ref 1.5–4.5)
Glucose: 129 mg/dL — ABNORMAL HIGH (ref 65–99)
Potassium: 4.8 mmol/L (ref 3.5–5.2)
Sodium: 140 mmol/L (ref 134–144)
Total Protein: 7 g/dL (ref 6.0–8.5)
eGFR: 45 mL/min/{1.73_m2} — ABNORMAL LOW (ref >59)

## 2021-04-30 LAB — LIPID PANEL
Chol/HDL Ratio: 3.2 ratio (ref 0.0–5.0)
Cholesterol, Total: 129 mg/dL (ref 100–199)
HDL: 40 mg/dL (ref >39)
LDL Chol Calc (NIH): 71 mg/dL (ref 0–99)
Triglycerides: 94 mg/dL (ref 0–149)
VLDL Cholesterol Cal: 18 mg/dL (ref 5–40)

## 2021-04-30 LAB — MAGNESIUM: Magnesium: 2.2 mg/dL (ref 1.6–2.3)

## 2021-05-01 ENCOUNTER — Telehealth: Payer: Self-pay | Admitting: *Deleted

## 2021-05-01 DIAGNOSIS — I480 Paroxysmal atrial fibrillation: Secondary | ICD-10-CM

## 2021-05-01 NOTE — Telephone Encounter (Addendum)
Results released to my chart  Lab orders mailed to the pt   ----- Message from Lelon Perla, MD sent at 05/01/2021  7:25 AM EDT ----- Increase po fluid intake; bmet 4 weeks [name]

## 2021-05-07 ENCOUNTER — Encounter: Payer: Self-pay | Admitting: *Deleted

## 2021-05-10 ENCOUNTER — Encounter: Payer: Self-pay | Admitting: Behavioral Health

## 2021-05-10 ENCOUNTER — Other Ambulatory Visit: Payer: Self-pay

## 2021-05-10 ENCOUNTER — Ambulatory Visit (INDEPENDENT_AMBULATORY_CARE_PROVIDER_SITE_OTHER): Payer: Medicare Other | Admitting: Behavioral Health

## 2021-05-10 VITALS — BP 165/63 | HR 66 | Ht 73.0 in | Wt 303.0 lb

## 2021-05-10 DIAGNOSIS — F411 Generalized anxiety disorder: Secondary | ICD-10-CM

## 2021-05-10 IMAGING — CR CHEST - 2 VIEW
3 series · 3 of 3 positions shown · non-contrast
Comparison: April 19, 2018

CLINICAL DATA: Chest pain

EXAM:
CHEST - 2 VIEW

[chest ap (1 of 2)]
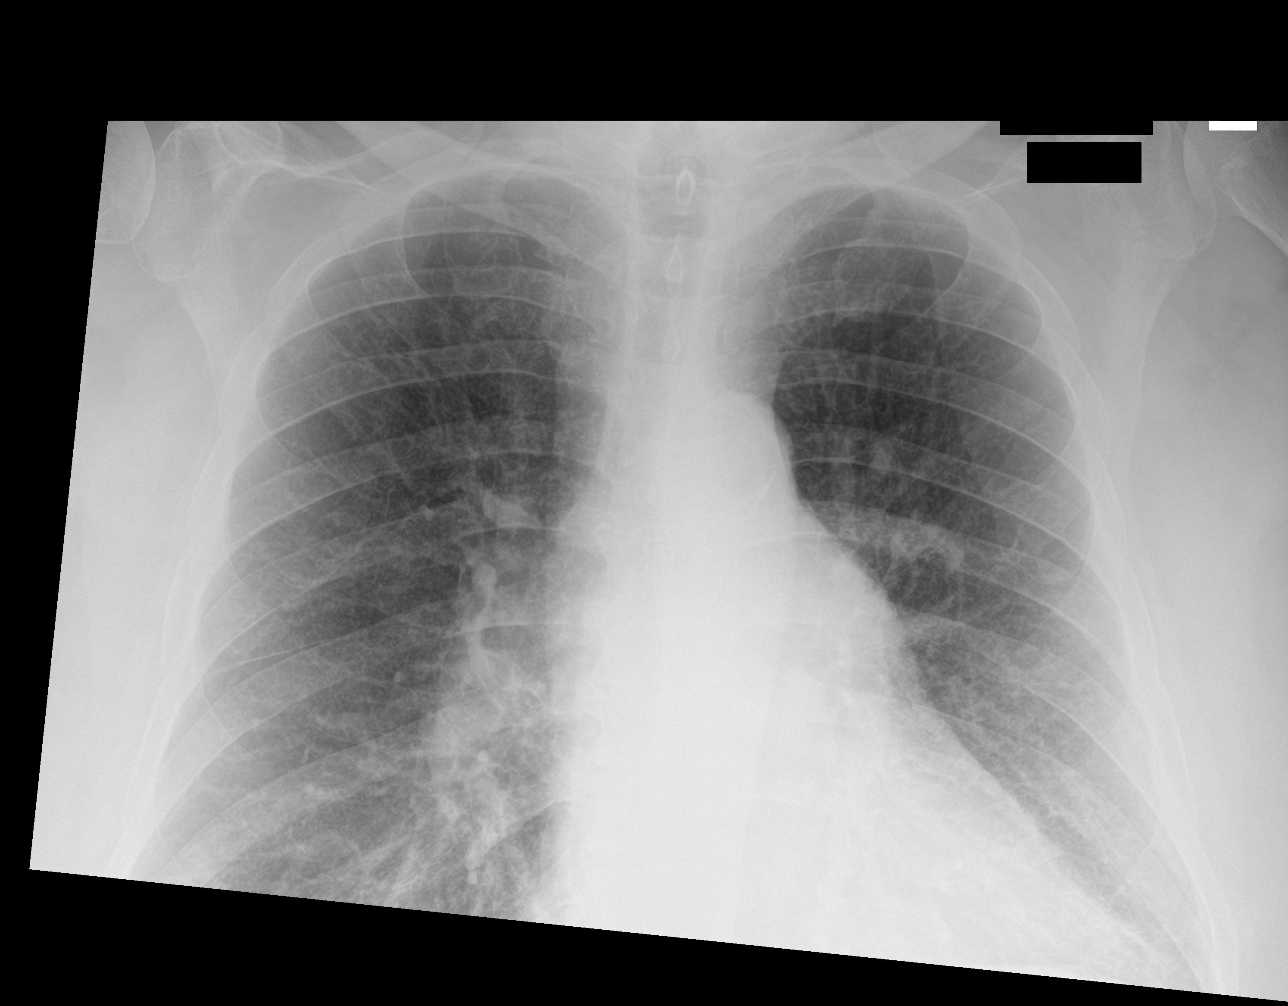

[chest lat]
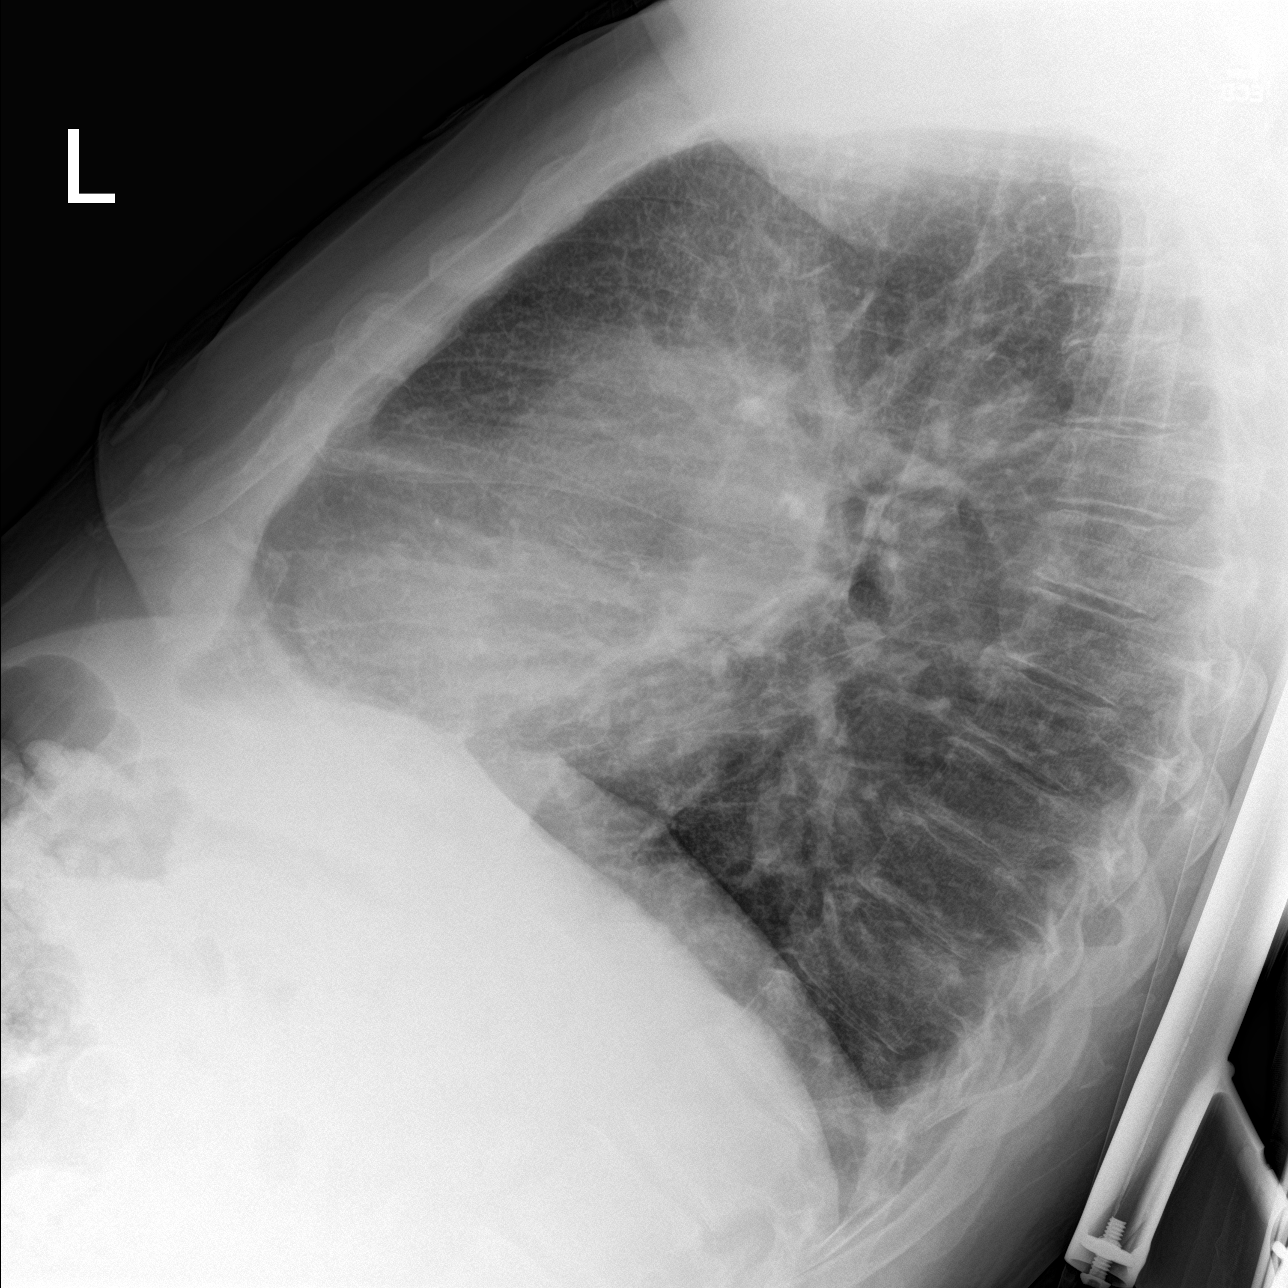

[chest ap (2 of 2)]
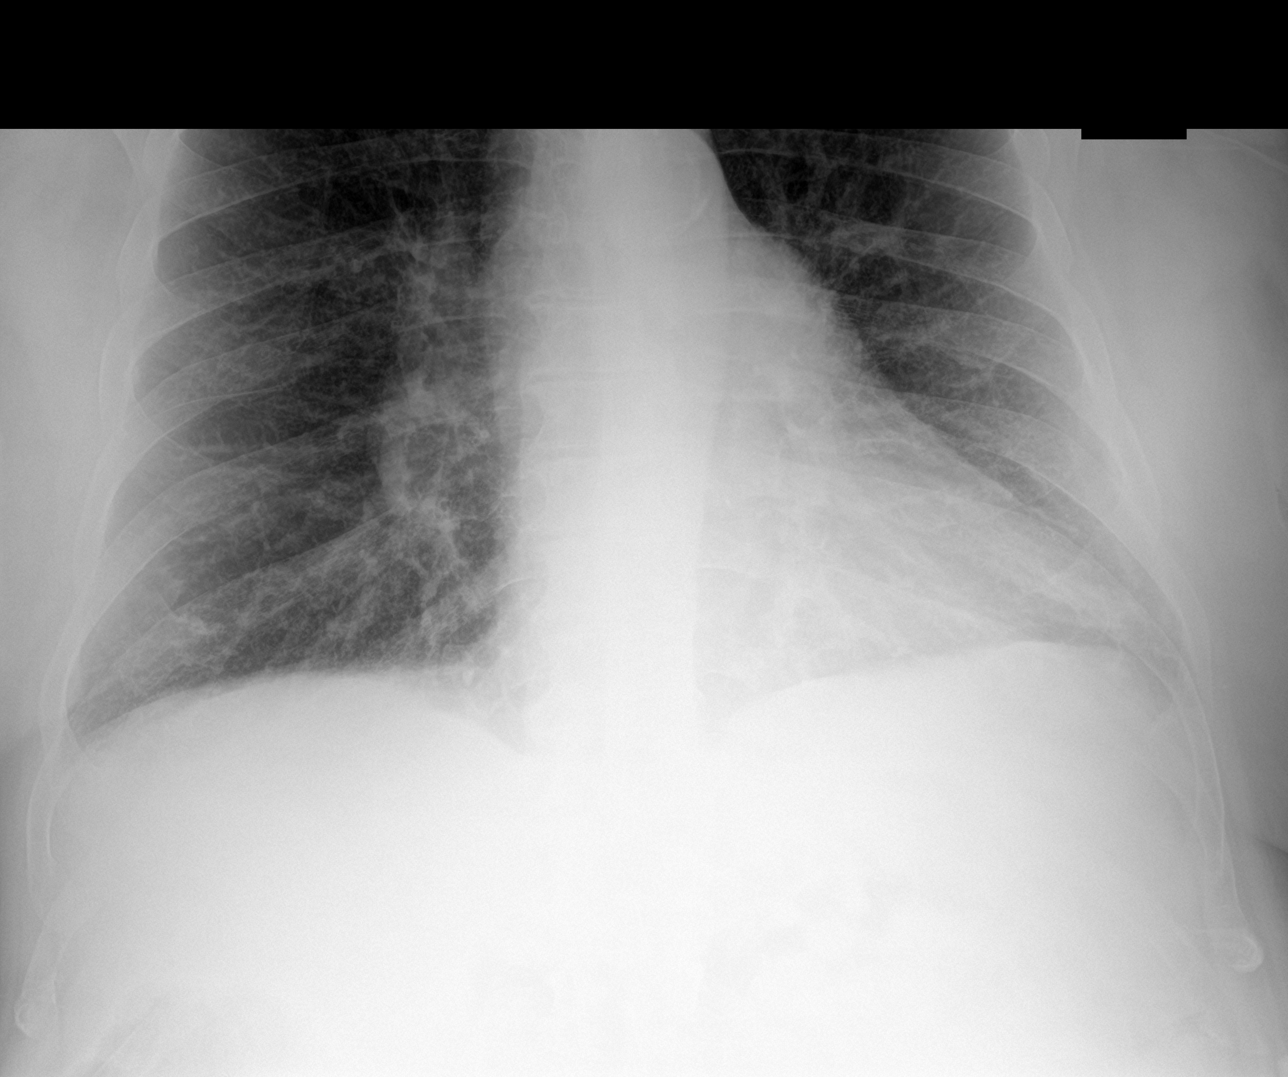

[3 of 3 positions shown; findings below may reference images not displayed]

FINDINGS: The heart size is enlarged but grossly stable when compared to prior
study. Aortic calcifications are noted. There is no pneumothorax. No
infiltrate. Again noted are findings suggestive of underlying
interstitial lung disease, better appreciated on prior CT from
02/03/2019. There is no significant pleural effusion. There is no
acute osseous abnormality.
IMPRESSION: No active cardiopulmonary disease.

## 2021-05-10 MED ORDER — BUSPIRONE HCL 15 MG PO TABS
ORAL_TABLET | ORAL | 1 refills | Status: DC
Start: 2021-05-10 — End: 2021-07-10

## 2021-05-10 MED ORDER — ALPRAZOLAM 0.5 MG PO TABS: 0.5000 mg | ORAL_TABLET | Freq: Two times a day (BID) | ORAL | 0 refills | Status: DC | PRN

## 2021-05-10 NOTE — Addendum Note (Signed)
Addended by: Lesle Chris A on: 05/10/2021 04:53 PM   Modules accepted: Orders

## 2021-05-10 NOTE — Progress Notes (Signed)
[facility] MD/PA/NP Initial Note  05/10/2021 4:34 PM [name] [name].  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   He says that he has been dealing with increased anxiety since 03-25-02 when he had a "near death experience" and a 4 day stay in the ICU. He said that he was in [place] at the time of occurrence and had sudden problems with breathing. Says the diagnosis was a cyst on his epiglottis causing inflammation and narrowing of his airway. Says he became clausterphobic after that incident. He also states prior hx of MI and stent placement. He states he went septic from cellulitis from infection in March 25, 2019. Pt states that he feels like all of this combined traumatic events contributed to anxiety. He says that he notices increased anxiety when spending time alone. Says that he is better when around other people. Reports his anxiety today is 6 and 0 depression. Says sleep is good with 7-8 hours per night. He is on CPAP. Pt states that he wanted to visit psychiatry to manage his medications and to look at other options for his anxiety. He denies mania. No  Auditory or visual hallucinations. No SI/HI.    Visit Diagnosis:    ICD-10-CM   1. Generalized anxiety disorder  F41.1 busPIRone (BUSPAR) 15 MG tablet    Past Psychiatric History:   Past Medical History:  Past Medical History:  Diagnosis Date  . Atrial fibrillation ([place]) 03/22/2009   a. s/p multiple DCCV; b. no coumadin due to low TE risk profile; c. Tikosyn Rx  . Coronary atherosclerosis of native coronary artery 11/2002   a. s/p stent to LAD 12/03; OM2 occluded at cath 12/03; d. myoview 5/10: no ischemia;  e. echo 7/11: EF 55%, BAE, mild RVE, PASP 41-45; Myoview was in March 2013. There was no ischemia or infarction, EF 51%   .  Cutaneous abscess of back excluding buttocks 07/04/2014   Appears to stem from possibly a cyst very large area 6 cm contact surgeon office   . Diabetes mellitus without complication ([place])   . Drusen body    see opth note  . Epiglottitis    w emergency nt intubation  . ERECTILE DYSFUNCTION 03/22/2009  . GERD 03/22/2009  . HYPERGLYCEMIA 04/25/2010  . HYPERLIPIDEMIA 03/22/2009  . 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 LATERAL EPICONDYLITIS, LEFT 10/24/2009  . LIVER FUNCTION TESTS, ABNORMAL 04/25/2010  . Local reaction to immunization 05/05/2012   minor resolving  zostavax   . Myocardial infarction ([place]) mi2003  . Numbness in left leg    foot related to back disease and surgery  . Obesity, unspecified 04/24/2009  . Perforated appendicitis with necrosis s/p open appendectomy 06/07/14 06/04/2014  . Renal cyst    Characterized by MRI as simple  . Ruptured suppurative appendicitis    25-Mar-2014   . SLEEP APNEA, OBSTRUCTIVE 03/22/2009   compliant with CPAP  . THROMBOCYTOPENIA 08/16/2010  . TOBACCO USE, QUIT 10/24/2009  . 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 Umbilical hernia     Past Surgical History:  Procedure Laterality Date  . APPENDECTOMY N/A 06/06/2014   Procedure: APPENDECTOMY;  Surgeon: Odis Hollingshead, MD;  Location: [facility];  Service: General;  Laterality:  N/A;  . COLON SURGERY    . COLONOSCOPY  11/15/2011   Procedure: COLONOSCOPY;  Surgeon: Juanita Craver, MD;  Location: [facility];  Service: Endoscopy;  Laterality: N/A;  . COLONOSCOPY WITH PROPOFOL N/A 01/03/2017   Procedure: COLONOSCOPY WITH PROPOFOL;  Surgeon: Carol Ada, MD;  Location: [facility];  Service: Endoscopy;  Laterality: N/A;  . COLONOSCOPY WITH PROPOFOL N/A 05/26/2020   Procedure: COLONOSCOPY WITH PROPOFOL;  Surgeon: Carol Ada, MD;  Location: [facility];  Service: Endoscopy;  Laterality: N/A;  . cyst on epiglottis  08/2002  . ESOPHAGOGASTRODUODENOSCOPY  11/15/2011   Procedure:  ESOPHAGOGASTRODUODENOSCOPY (EGD);  Surgeon: Juanita Craver, MD;  Location: [facility];  Service: Endoscopy;  Laterality: N/A;  . LAPAROSCOPIC APPENDECTOMY N/A 06/06/2014   Procedure: APPENDECTOMY LAPAROSCOPIC attemted;  Surgeon: Odis Hollingshead, MD;  Location: [facility];  Service: General;  Laterality: N/A;  . LUMBAR DISC SURGERY     two  holes in spinalcovering  . POLYPECTOMY  05/26/2020   Procedure: POLYPECTOMY;  Surgeon: Carol Ada, MD;  Location: [facility];  Service: Endoscopy;;  . stent     LAD DUS 2004  . STERNAL WOUND DEBRIDEMENT Left 08/31/2019   Procedure: LEFT STERNOCLAVICULAR WOUND DEBRIDEMENT WITH APPLICATION OF WOUND VAC;  Surgeon: Grace Isaac, MD;  Location: [facility];  Service: Thoracic;  Laterality: Left;  . surgery l4-l5   1998   ruptured x 3  . ulnar neuropathy    . UMBILICAL HERNIA REPAIR     mesh    Family Psychiatric History:   Family History:  Family History  Problem Relation Age of Onset  . Thyroid disease Mother   . Ovarian cancer Mother   . Breast cancer Mother   . Lung cancer Father   . Cancer Father     Social History:  Social History   Socioeconomic History  . Marital status: Married    Spouse name: Not on file  . Number of children: 1  . Years of education: 51  . Highest education level: Associate degree: occupational, Hotel manager, or vocational program  Occupational History  . Occupation: retired    Comment: [employer]  Tobacco Use  . Smoking status: Former Smoker    Packs/day: 2.00    Years: 42.00    Pack years: 84.00    Types: Cigarettes    Quit date: 12/30/2008    Years since quitting: 12.3  . Smokeless tobacco: Never Used  . Tobacco comment: started at age 45; 1-2 ppd; quit 2010  Vaping Use  . Vaping Use: Never used  Substance and Sexual Activity  . Alcohol use: No    Alcohol/week: 0.0 standard drinks    Comment: rarely; maybe 1 beer a year  . Drug use: No  . Sexual activity: Yes    Comment: infrequently  Other  Topics Concern  . Not on file  Social History Narrative   Retired paramedic   Regular exercise-yes not recently due to SOB   Has one son   Wife is overweight and has fibromyalgia and depression on disability doesn't go out much. Back surgery    Has older dog   Retired from [employer] 31 years of service now 7 years    Caffeine- minimal   Social Determinants of Radio broadcast assistant Strain: Not on file  Food Insecurity: Not on file  Transportation Needs: No Transportation Needs  . Lack of Transportation (Medical): No  . Lack of Transportation (Non-Medical): No  Physical Activity: Inactive  . Days of  Exercise per Week: 0 days  . Minutes of Exercise per Session: 0 min  Stress: Not on file  Social Connections: Not on file    Allergies:  Allergies  Allergen Reactions  . Pseudoephedrine Other (See Comments)    Patient went into afib  . Diltiazem Rash and Itching    Also 2019  . Novocain [Procaine] Hives    Dentist appointment in 1958; since then has tolerated lidocaine and provocaine with no hives or difficulty.  . Quinolones     Patient was warned about not using Cipro and similar antibiotics. Recent studies have raised concern that fluoroquinolone antibiotics could be associated with an increased risk of aortic aneurysm Fluoroquinolones have non-antimicrobial properties that might jeopardise the integrity of the extracellular matrix of the vascular wall In a  propensity score matched cohort study in [place], there was a 66% increased rate of aortic aneurysm or dissection associated with oral fluoroquinolone use, compared wit  . Sulfonamide Derivatives Other (See Comments)    Childhood reaction     Metabolic Disorder Labs: Lab Results  Component Value Date   HGBA1C 6.4 (A) 04/24/2021   MPG 134 09/14/2020   MPG 140 (H) 06/05/2014   No results found for: PROLACTIN Lab Results  Component Value Date   CHOL 129 04/30/2021   TRIG 94 04/30/2021   HDL 40 04/30/2021   CHOLHDL  3.2 04/30/2021   VLDL 22.0 02/08/2019   LDLCALC 71 04/30/2021   LDLCALC 37 04/25/2020   Lab Results  Component Value Date   TSH 1.414 08/27/2019   TSH 1.91 06/25/2018    Therapeutic Level Labs: No results found for: LITHIUM No results found for: VALPROATE No components found for:  CBMZ  Current Medications: Current Outpatient Medications  Medication Sig Dispense Refill  . albuterol (PROVENTIL HFA;VENTOLIN HFA) 108 (90 Base) MCG/ACT inhaler Inhale 2 puffs into the lungs every 6 (six) hours as needed for wheezing or shortness of breath. 1 Inhaler 0  . ALPHAGAN P 0.1 % SOLN Place 1 drop into both eyes 3 (three) times daily.   3  . ALPRAZolam (XANAX) 0.5 MG tablet TAKE 1 TABLET BY MOUTH 2 TIMES DAILY AS NEEDED FOR ANXIETY. 24 tablet 0  . ANORO ELLIPTA 62.5-25 MCG/INH AEPB INHALE 1 PUFF BY MOUTH EVERY DAY 60 each 5  . apixaban (ELIQUIS) 5 MG TABS tablet Take 1 tablet (5 mg total) by mouth 2 (two) times daily. 180 tablet 3  . atorvastatin (LIPITOR) 80 MG tablet TAKE 1 TABLET BY MOUTH  DAILY 90 tablet 3  . Blood Glucose Monitoring Suppl (ONETOUCH VERIO) w/Device KIT Use to test blood sugars 1-2 times daily. 1 kit 1  . brimonidine (ALPHAGAN) 0.15 % ophthalmic solution 1 drop 3 (three) times daily.    . busPIRone (BUSPAR) 15 MG tablet Take 1/3 tablet p.o. twice daily for 1 week, then take 2/3 tablet p.o. twice daily for 1 week, then take 1 tablet p.o. twice daily 30 tablet 1  . Cholecalciferol (VITAMIN D) 50 MCG (2000 UT) tablet Take 2,000 Units by mouth 2 (two) times daily.    . diphenhydrAMINE (BENADRYL) 25 MG tablet Take 25-50 mg by mouth at bedtime as needed for sleep.     [name] dofetilide (TIKOSYN) 500 MCG capsule TAKE 1 CAPSULE BY MOUTH  TWICE DAILY 180 capsule 3  . famotidine (PEPCID) 10 MG tablet Take 10 mg by mouth as needed for heartburn.     . fexofenadine (ALLEGRA) 180 MG tablet Take 180 mg by mouth daily  as needed for allergies.     . fish oil-omega-3 fatty acids 1000 MG capsule Take  1 g by mouth 2 (two) times daily.    . fluticasone (FLONASE) 50 MCG/ACT nasal spray SPRAY 2 SPRAYS IN EACH NOSTRIL DAILY AS NEEDED FOR ALLERGIES 48 mL PRN  . hydrALAZINE (APRESOLINE) 50 MG tablet Take 1.5 tablets (75 mg total) by mouth 2 (two) times daily. 270 tablet 3  . KLOR-CON M20 20 MEQ tablet TAKE 1 TABLET BY MOUTH EVERY DAY 90 tablet 3  . Lancets (ONETOUCH ULTRASOFT) lancets Twice daily 100 each 6  . losartan (COZAAR) 100 MG tablet Take 1 tablet (100 mg total) by mouth daily. 90 tablet 3  . Magnesium 250 MG TABS Take 250 mg by mouth daily. Patient takes 250 mg AM and 500 mg PM    . metFORMIN (GLUCOPHAGE-XR) 500 MG 24 hr tablet TAKE 1 TABLET BY MOUTH IN THE AM, AT NOON AND AT BEDTIME 270 tablet 3  . methocarbamol (ROBAXIN) 500 MG tablet TAKE 1 TABLET BY MOUTH 4 TIMES A DAY AS NEEDED FOR MUSCLE SPASM 120 tablet 2  . MULTIPLE VITAMIN PO Take 1 tablet by mouth every evening.    . nitroGLYCERIN (NITROSTAT) 0.4 MG SL tablet Place 1 tablet (0.4 mg total) under the tongue every 5 (five) minutes as needed for chest pain. 25 tablet 12  . ONETOUCH VERIO test strip USE AS DIRECTED TWICE A DAY 50 strip 12  . PARoxetine (PAXIL) 20 MG tablet Take 2.5 tablets (50 mg total) by mouth daily. 90 tablet 1  . tamsulosin (FLOMAX) 0.4 MG CAPS capsule TAKE 1 CAPSULE BY MOUTH EVERY DAY (Patient taking differently: Take 0.4 mg by mouth daily as needed (repeat kidney stone).) 30 capsule 5  . traZODone (DESYREL) 50 MG tablet Take 1 tablet (50 mg total) by mouth at bedtime. 90 tablet 3  . triamcinolone cream (KENALOG) 0.1 % Apply 1 application topically 2 (two) times daily. As needed. (Patient taking differently: Apply 1 application topically 2 (two) times daily as needed (irritation).) 30 g 0  . acetaminophen (TYLENOL) 325 MG tablet Take 650 mg by mouth every 6 (six) hours as needed for mild pain. (Patient not taking: Reported on 05/10/2021)    . ondansetron (ZOFRAN ODT) 4 MG disintegrating tablet Take 1 tablet (4 mg  total) by mouth every 8 (eight) hours as needed for nausea or vomiting. (Patient not taking: Reported on 05/10/2021) 24 tablet 0  . pantoprazole (PROTONIX) 40 MG tablet Take 40 mg by mouth daily as needed (acid reflux).  (Patient not taking: Reported on 05/10/2021)     No current facility-administered medications for this visit.    Medication Side Effects: see allergies  Orders placed this visit:  No orders of the defined types were placed in this encounter.   Psychiatric Specialty Exam:  Review of Systems  HENT: Positive for hearing loss.   Eyes: Positive for visual disturbance.  Respiratory: Positive for shortness of breath.   Hematological: Bruises/bleeds easily.  Psychiatric/Behavioral: The patient is nervous/anxious.     Blood pressure (!) 165/63, pulse 66, height 6' 1"  (1.854 m), weight (!) 303 lb (137.4 kg).Body mass index is 39.98 kg/m.  General Appearance: Casual, Neat and Well Groomed  Eye Contact:  Good  Speech:  Clear and Coherent  Volume:  Normal  Mood:  Anxious  Affect:  Appropriate  Thought Process:  Coherent  Orientation:  Full (Time, Place, and Person)  Thought Content: Logical   Suicidal Thoughts:  No  Homicidal Thoughts:  No  Memory:  WNL  Judgement:  Good  Insight:  Good  Psychomotor Activity:  Normal  Concentration:  Concentration: Good  Recall:  Good  Fund of Knowledge: Good  Language: Good  Assets:  Desire for Improvement Physical Health Resilience Social Support  ADL's:  Intact  Cognition: WNL  Prognosis:  Good   Screenings:  PHQ2-9   [place] Office Visit from 01/24/2021 in [place] at [facility] from 07/21/2020 in [place] at [facility] from 01/21/2020 in [place] at [facility] Video Visit from 01/13/2020 in [facility] for Infectious Disease Office Visit from 11/18/2019 in [facility] for Infectious Disease  PHQ-2 Total Score 0 0 0 0 0  PHQ-9 Total Score  -- 0 -- -- --    Flowsheet Row ED from 07/30/2018 in [place] No Risk      Receiving Psychotherapy: No   Treatment Plan/Recommendations:  Continue on Paxil 50 mg daily Continue Alprazolam 0.5 mg two times daily PRN Continue trazodone 50 mg at bedtime  Start BuSpar 15 mg 1/3 tablet twice daily for 1 week, then increase to 2/3 tablet twice daily for 1 week, then increase to 1 tablet twice daily for anxiety. Will notify this office if condition worsens or experiencing side effects Recommended psychotherapy  To follow up in 4 weeks to reassess anxiety levels Greater than 50% of face to face time with patient was spent on counseling and coordination of care. We discussed alternative medications for anxiety. Discussed long term plan of care. We talked about exploring PTSD next visit and advised therapy plus medication produces better outcomes.      Elwanda Brooklyn, NP

## 2021-05-11 IMAGING — CT CT ANGIO CHEST-ABD-PELV FOR DISSECTION W/ AND WO/W CM
2 of 7 series · 12 of 46 positions shown, 14 images · IV contrast (omnipaque)
Comparison: CT chest 02/03/2019 CT of the abdomen pelvis 07/30/2018

CLINICAL DATA: Chest pain extending to the back for 1 week.
Arrhythmia. Known right iliac artery aneurysm. Known ascending
thoracic aortic aneurysm. Initial encounter

EXAM:
CT ANGIOGRAPHY CHEST, ABDOMEN AND PELVIS
TECHNIQUE: Multidetector CT imaging through the chest, abdomen and pelvis was
performed using the standard protocol during bolus administration of
intravenous contrast. Multiplanar reconstructed images and MIPs were
obtained and reviewed to evaluate the vascular anatomy.
CONTRAST:  100mL OMNIPAQUE IOHEXOL 350 MG/ML SOLN

[Series 6: arterial · axial · arterial · 0.92mm/px · z∈[+543,+1193]mm · 9 of 367 slices shown, 11 images]
[im 21/367  soft-tissue]
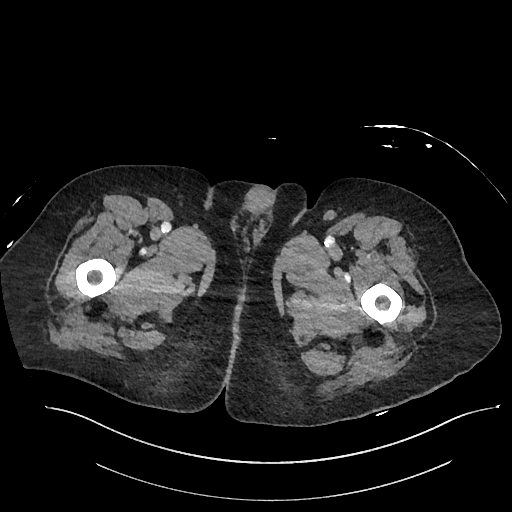
[im 21/367  bone]
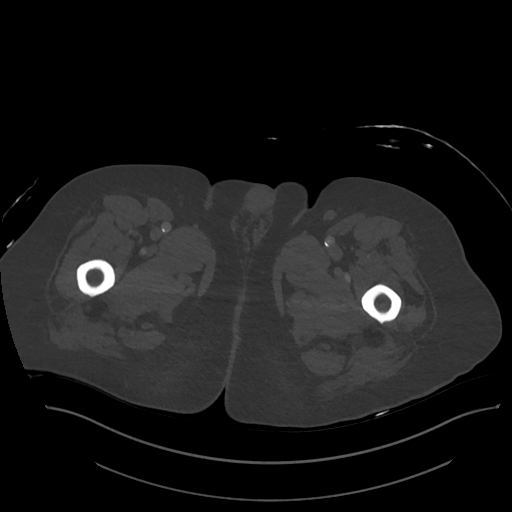
[im 62/367  soft-tissue]
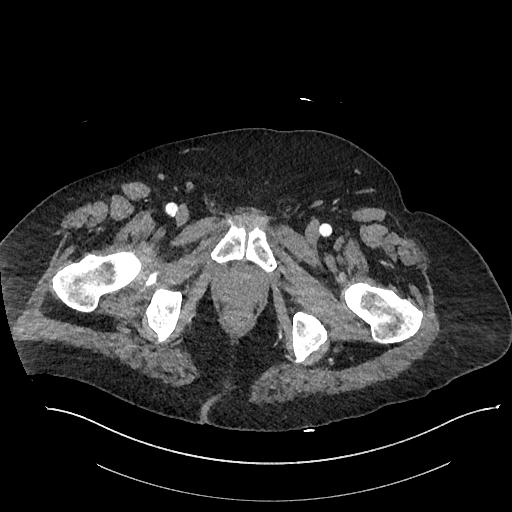
[im 102/367  soft-tissue]
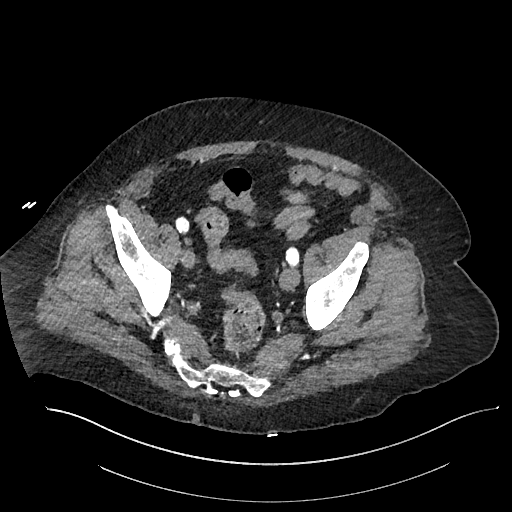
[im 143/367  soft-tissue]
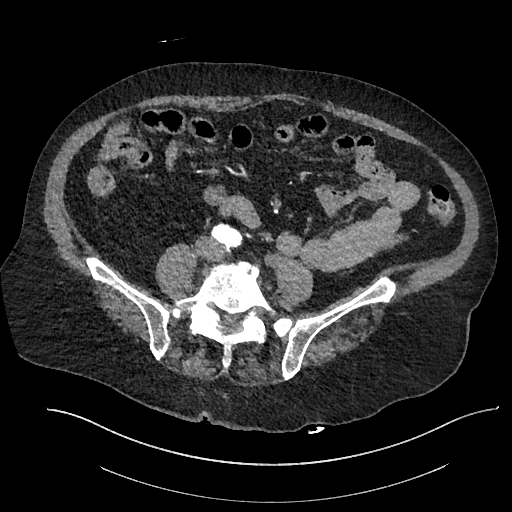
[im 184/367  soft-tissue]
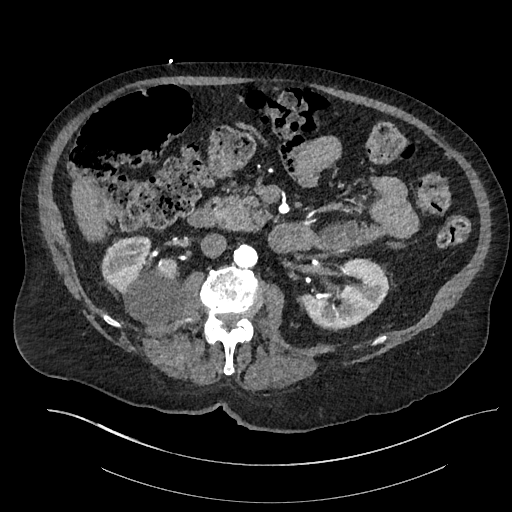
[im 224/367  soft-tissue]
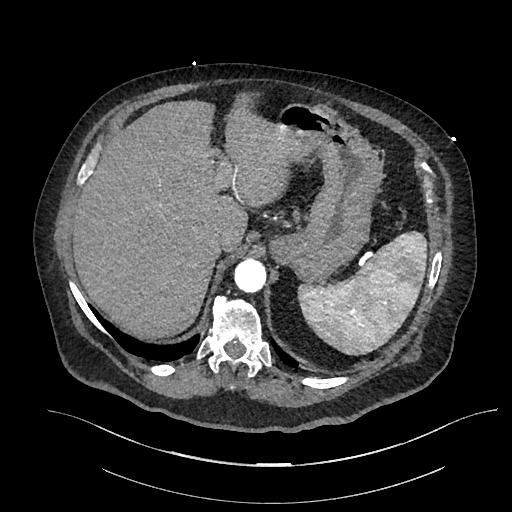
[im 265/367  soft-tissue]
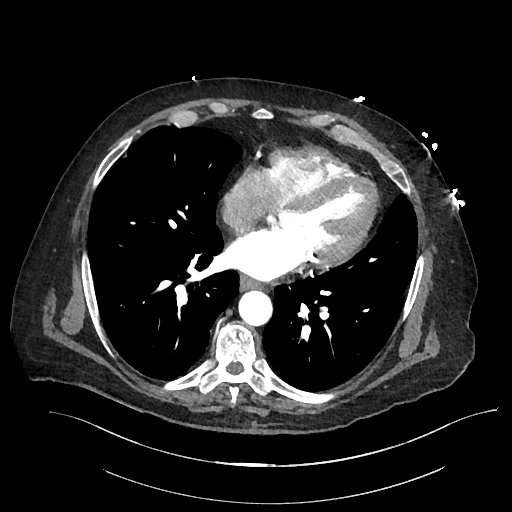
[im 306/367  soft-tissue]
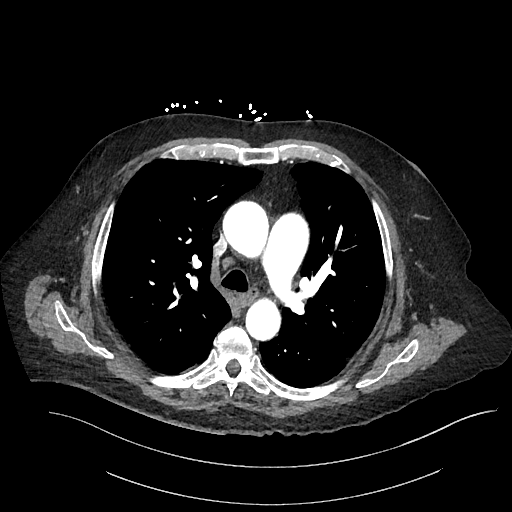
[im 346/367  soft-tissue]
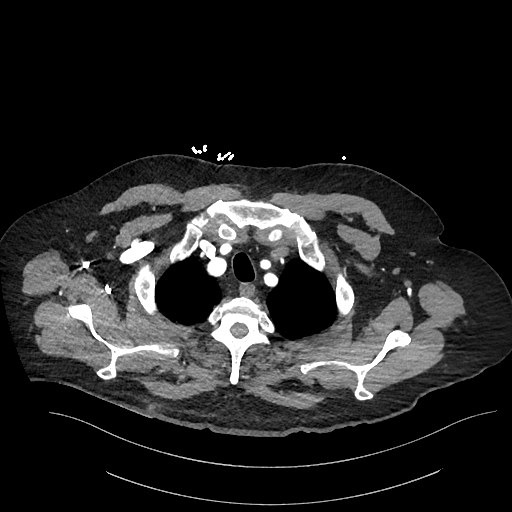
[im 346/367  bone]
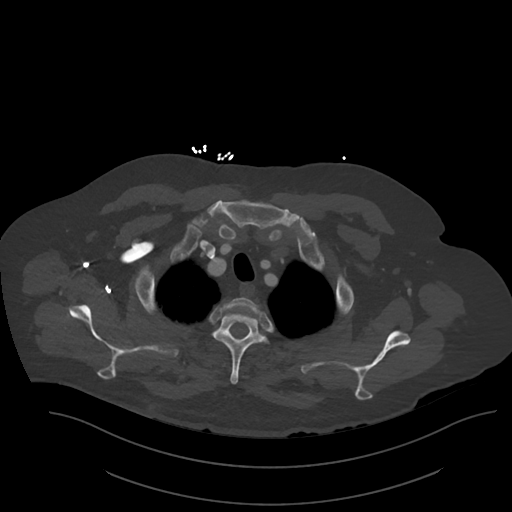

[Series 9: cor · coronal · 0.96mm/px · 3 of 201 slices shown]
[im 51/201  soft-tissue]
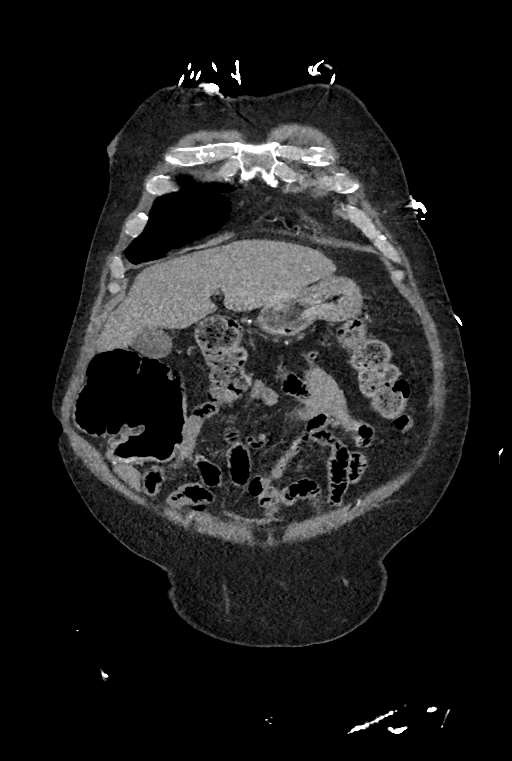
[im 101/201  soft-tissue]
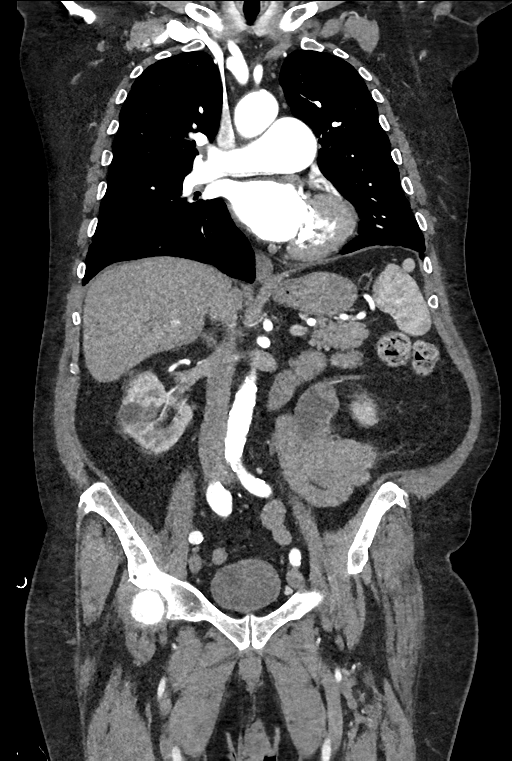
[im 151/201  soft-tissue]
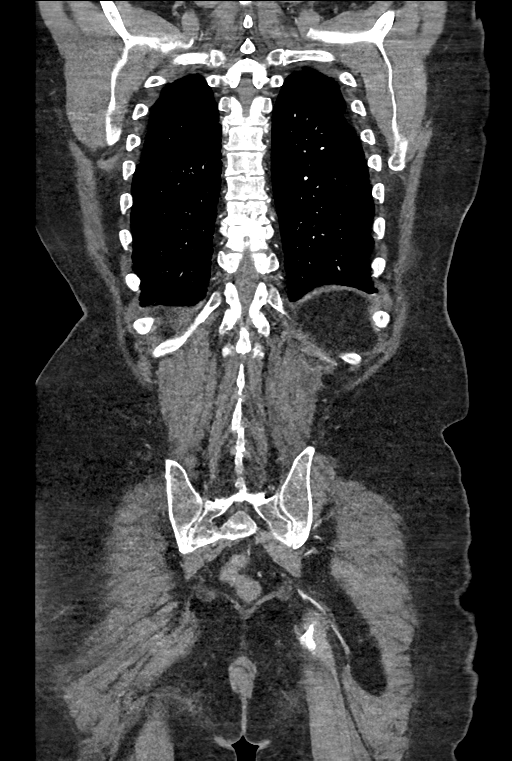

[12 of 46 positions shown; findings below may reference images not displayed]

FINDINGS: CTA CHEST FINDINGS

Cardiovascular: The heart size is normal. Dense coronary artery
calcifications are again noted.

Atherosclerotic calcifications are present at the aortic arch. No
displaced calcifications are present

The main pulmonary artery outflow tract remains dilated at 4.6 cm.
The ascending aorta measures up to 4.1 cm. Atherosclerotic
calcifications are present at the origin of the left subclavian
artery without significant stenosis. Distal arch calcifications are
present. There is no aneurysm or evidence for dissection within the
descending aorta.

Mediastinum/Nodes: Subcentimeter mediastinal lymph nodes are similar
the prior exam, likely reactive. Largest node is a stable 8 mm right
paratracheal node. No new nodes are present. No significant axillary
mediastinal adenopathy is present.

Lungs/Pleura: Mild-to-moderate centrilobular and paraseptal
emphysema is again noted. There is mild diffuse bronchial wall
thickening. Subpleural nodule in the right middle lobe is stable,
measuring 8 x 5 mm. No new nodules are present. Patchy ground-glass
opacities are again seen. There is no distinct honeycombing. No
focal airspace consolidation is present. No new nodule or mass
lesion is present.

Musculoskeletal: 12 rib-bearing thoracic type vertebral bodies are
present. Degenerative changes are noted in the lower cervical spine.
No acute or healing fractures are present. Ribs are unremarkable. No
focal lytic or blastic lesions are present.

Review of the MIP images confirms the above findings.

CTA ABDOMEN AND PELVIS FINDINGS

VASCULAR

Aorta: Normal caliber aorta without aneurysm, dissection, vasculitis
or significant stenosis. Atherosclerotic calcifications are present
particularly in the infrarenal aorta.

Celiac: Patent without evidence of aneurysm, dissection, vasculitis
or significant stenosis.

SMA: Minimal atherosclerotic calcifications are present at the
origin. There is no significant stenosis. Branch vessels are within
normal limits.

Renals: Duplicated right renal artery is present. Atherosclerotic
changes are noted at the ostia of both renal arteries without
significant stenosis. There is no aneurysm or vessel irregularity.
No FMD.

IMA: Patent without evidence of aneurysm, dissection, vasculitis or
significant stenosis.

Inflow: Aneurysmal dilation of the right common iliac artery
measures up to 2.1 cm, not significantly changed. No dissection. No
significant vasculitis. More mild dilation of the left common iliac
artery is noted. Dense calcifications are present bilaterally
without significant stenosis.

Veins: No obvious venous abnormality within the limitations of this
arterial phase study.

Review of the MIP images confirms the above findings.

NON-VASCULAR

Hepatobiliary: Peripherally calcified 2 cm gallstone is again seen.
No focal hepatic lesions are present. Gallbladder is otherwise
unremarkable. Common bile duct is normal.

Pancreas: Unremarkable. No pancreatic ductal dilatation or
surrounding inflammatory changes.

Spleen: Normal in size without focal abnormality.

Adrenals/Urinary Tract: The adrenal glands are normal bilaterally.
5.4 cm right lower pole exophytic cyst demonstrate some interval
increase in size. More anterior upper pole cyst measures 2 point 1
cm, also slightly increased. Nonobstructing punctate stone is
present at the lower pole of the right kidney. Left kidney is
unremarkable. Ureter is normal. The urinary bladder is within normal
limits.

Stomach/Bowel: The stomach and duodenum are within normal limits.
Small bowel is unremarkable. Terminal ileum is within normal limits.
The ascending and transverse colon are within normal limits.
Diverticular changes are present in the descending and sigmoid colon
without inflammatory changes to suggest diverticulitis.

Lymphatic: No significant retroperitoneal adenopathy is present.

Reproductive: Prostate is mildly enlarged.

Other: No abdominal wall hernia or abnormality. No abdominopelvic
ascites.

Musculoskeletal: Degenerative changes of the lumbar spine are most
pronounced at L4-5 L5-S1. Vertebral body heights are maintained. No
acute or healing fractures are present. Pelvis is within normal
limits. The hips are located and within normal limits.

Review of the MIP images confirms the above findings.
IMPRESSION: 1. No acute vascular abnormality to explain the patient's chest
pain. No dissection.
2. Ascending thoracic aortic aneurysm measures 4.1 cm maximally on
today's study. Recommend semi-annual imaging followup by CTA or MRA
and referral to cardiothoracic surgery if not already obtained. This
recommendation follows 8636
ACCF/AHA/AATS/ACR/ASA/SCA/CHAW JWAN/DIMAZ/ERXLEBEN/TOMLIN Guidelines for the
Diagnosis and Management of Patients With Thoracic Aortic Disease.
Circulation. 8636; 121: E266-e36. Aortic aneurysm NOS (HQT50-N16.9)
3. Stable dilation main pulmonary outflow tract consistent with
pulmonary arterial hypertension.
4. Similar findings of fibrotic interstitial lung disease without
honeycombing.
5. Coronary artery disease.
6. Atherosclerotic changes in the abdominal aorta as described
without aneurysm or dissection.
7. Stable aneurysmal dilation of the common iliac arteries, right
greater than left.
8. Atherosclerotic changes at the origins of the superior mesenteric
artery and bilateral renal arteries without significant stenosis.
9. Slight enlargement of right-sided renal cysts.
10. Punctate nonobstructing stone at the lower pole of the right
kidney.
11. Degenerative changes in the thoracic and lumbar spine without
acute abnormality.

## 2021-05-16 ENCOUNTER — Other Ambulatory Visit: Payer: Self-pay | Admitting: *Deleted

## 2021-05-16 DIAGNOSIS — I712 Thoracic aortic aneurysm, without rupture, unspecified: Secondary | ICD-10-CM

## 2021-05-24 ENCOUNTER — Telehealth: Payer: Self-pay | Admitting: Internal Medicine

## 2021-05-24 NOTE — Telephone Encounter (Signed)
Pt called back and scheduled appointment.

## 2021-05-24 NOTE — Telephone Encounter (Signed)
Left message for patient to call back and schedule Medicare Annual Wellness Visit (AWV) either virtually or in office.   Last AWV 08/11/18  please schedule at anytime with [facility] Nurse Health Advisor 1 or 2   This should be a 45 minute visit.

## 2021-05-25 ENCOUNTER — Ambulatory Visit (HOSPITAL_COMMUNITY): Payer: Medicare Other | Attending: Cardiology

## 2021-05-25 ENCOUNTER — Other Ambulatory Visit: Payer: Self-pay

## 2021-05-25 DIAGNOSIS — R011 Cardiac murmur, unspecified: Secondary | ICD-10-CM | POA: Insufficient documentation

## 2021-05-25 LAB — ECHOCARDIOGRAM COMPLETE
AR max vel: 2.31 cm2
AV Area VTI: 1.71 cm2
AV Area mean vel: 2.34 cm2
AV Mean grad: 10 mmHg
AV Peak grad: 21.2 mmHg
Ao pk vel: 2.3 m/s
Area-P 1/2: 1.98 cm2
P 1/2 time: 307 msec
S' Lateral: 5.1 cm

## 2021-05-25 NOTE — Progress Notes (Signed)
Patient ID: [name] Anacker., male   DOB: Sep 23, 1951, 70 y.o.   MRN: [id number]  Echocardiogram 2D Echocardiogram has been performed.  Jennette Dubin 05/25/21

## 2021-06-02 ENCOUNTER — Other Ambulatory Visit: Payer: Self-pay | Admitting: Behavioral Health

## 2021-06-02 DIAGNOSIS — F411 Generalized anxiety disorder: Secondary | ICD-10-CM

## 2021-06-05 ENCOUNTER — Encounter: Payer: Self-pay | Admitting: *Deleted

## 2021-06-05 ENCOUNTER — Telehealth: Payer: Self-pay | Admitting: *Deleted

## 2021-06-05 NOTE — Telephone Encounter (Signed)
Spoke with pt, aware of the change and aware of the need to increase fluid intake prior to lab work and CT scan. Original CT scan was ordered by dr Orvan Seen, I called and let them know of the change.

## 2021-06-05 NOTE — Telephone Encounter (Signed)
Sent patient message vis my chart to call me, I was unable to get through on the phone. He is scheduled for a CT wo contrast next week and I changed it to a CTA because of his thoracic aneurysm. His kidney function is elevated and I need him to drink a lot of fluids prior to having his lab work for the CT drawn.

## 2021-06-05 NOTE — H&P (View-Only) (Signed)
HPI: FU hx of CAD s/p stent to LAD in 2003, parox AFib, HL, OSA. He has seen Dr. Rayann Heman and ablation could be considered if he lost weight. Previous rash with cardizem; on tikosyn. Has had previous cardioversions for atrial fibrillation. Carotid Dopplers June 2019 showed 1 to 39% bilateral stenosis. Abdominal ultrasound March 2021 showed no aneurysm. The right and left iliac arteries were dilated (followed by vascular surgery). Echocardiogram May 2022 showed normal LV function, grade 1 diastolic dysfunction, moderate left atrial enlargement, mild right atrial enlargement, moderate to severe aortic insufficiency, mild aortic stenosis, mildly dilated ascending aorta at 43 mm.  Chest CT June 2022 showed 4.4 x 4.4 cm aortic aneurysm, dilated pulmonary artery at 4.6 cm, emphysema with areas of fibrosis.  Since last seen, he has some dyspnea on exertion.  No orthopnea, PND, pedal edema, chest pain, palpitations or syncope.  Current Outpatient Medications  Medication Sig Dispense Refill   acetaminophen (TYLENOL) 325 MG tablet Take 650 mg by mouth every 6 (six) hours as needed for mild pain.     albuterol (PROVENTIL HFA;VENTOLIN HFA) 108 (90 Base) MCG/ACT inhaler Inhale 2 puffs into the lungs every 6 (six) hours as needed for wheezing or shortness of breath. 1 Inhaler 0   ALPRAZolam (XANAX) 0.5 MG tablet Take 1 tablet (0.5 mg total) by mouth 2 (two) times daily as needed for anxiety. 30 tablet 0   ANORO ELLIPTA 62.5-25 MCG/INH AEPB INHALE 1 PUFF BY MOUTH EVERY DAY 60 each 5   apixaban (ELIQUIS) 5 MG TABS tablet Take 1 tablet (5 mg total) by mouth 2 (two) times daily. 180 tablet 3   atorvastatin (LIPITOR) 80 MG tablet TAKE 1 TABLET BY MOUTH  DAILY 90 tablet 3   Blood Glucose Monitoring Suppl (ONETOUCH VERIO) w/Device KIT Use to test blood sugars 1-2 times daily. 1 kit 1   brimonidine (ALPHAGAN) 0.15 % ophthalmic solution 1 drop 3 (three) times daily.     busPIRone (BUSPAR) 10 MG tablet Take 1 tablet (10  mg total) by mouth 2 (two) times daily. 180 tablet 1   busPIRone (BUSPAR) 15 MG tablet Take 1/3 tablet p.o. twice daily for 1 week, then take 2/3 tablet p.o. twice daily for 1 week, then take 1 tablet p.o. twice daily 30 tablet 1   Cholecalciferol (VITAMIN D) 50 MCG (2000 UT) tablet Take 2,000 Units by mouth 2 (two) times daily.     diphenhydrAMINE (BENADRYL) 25 MG tablet Take 25-50 mg by mouth at bedtime as needed for sleep.      dofetilide (TIKOSYN) 500 MCG capsule TAKE 1 CAPSULE BY MOUTH  TWICE DAILY 180 capsule 3   famotidine (PEPCID) 10 MG tablet Take 10 mg by mouth as needed for heartburn.      fexofenadine (ALLEGRA) 180 MG tablet Take 180 mg by mouth daily as needed for allergies.      fish oil-omega-3 fatty acids 1000 MG capsule Take 1 g by mouth 2 (two) times daily.     fluticasone (FLONASE) 50 MCG/ACT nasal spray SPRAY 2 SPRAYS IN EACH NOSTRIL DAILY AS NEEDED FOR ALLERGIES 48 mL PRN   hydrALAZINE (APRESOLINE) 100 MG tablet Take 1 tablet (100 mg total) by mouth 2 (two) times daily. 180 tablet 3   KLOR-CON M20 20 MEQ tablet TAKE 1 TABLET BY MOUTH EVERY DAY 90 tablet 3   Lancets (ONETOUCH ULTRASOFT) lancets Twice daily 100 each 6   losartan (COZAAR) 100 MG tablet Take 1 tablet (100 mg total)  by mouth daily. 90 tablet 3   Magnesium 250 MG TABS Take 250 mg by mouth daily. Patient takes 250 mg AM and 500 mg PM     metFORMIN (GLUCOPHAGE-XR) 500 MG 24 hr tablet TAKE 1 TABLET BY MOUTH IN THE AM, AT NOON AND AT BEDTIME 270 tablet 3   methocarbamol (ROBAXIN) 500 MG tablet TAKE 1 TABLET BY MOUTH 4 TIMES A DAY AS NEEDED FOR MUSCLE SPASM 120 tablet 2   MULTIPLE VITAMIN PO Take 1 tablet by mouth every evening.     nitroGLYCERIN (NITROSTAT) 0.4 MG SL tablet Place 1 tablet (0.4 mg total) under the tongue every 5 (five) minutes as needed for chest pain. 25 tablet 12   ONETOUCH VERIO test strip USE AS DIRECTED TWICE A DAY 50 strip 12   pantoprazole (PROTONIX) 40 MG tablet Take 40 mg by mouth daily as  needed (acid reflux).     PARoxetine (PAXIL) 20 MG tablet Take 2.5 tablets (50 mg total) by mouth daily. 90 tablet 1   tamsulosin (FLOMAX) 0.4 MG CAPS capsule TAKE 1 CAPSULE BY MOUTH EVERY DAY (Patient taking differently: Take 0.4 mg by mouth daily as needed (repeat kidney stone).) 30 capsule 5   traZODone (DESYREL) 50 MG tablet Take 1 tablet (50 mg total) by mouth at bedtime. 90 tablet 3   triamcinolone cream (KENALOG) 0.1 % Apply 1 application topically 2 (two) times daily. As needed. (Patient taking differently: Apply 1 application topically 2 (two) times daily as needed (irritation).) 30 g 0   No current facility-administered medications for this visit.     Past Medical History:  Diagnosis Date   Atrial fibrillation ([place]) 03/22/2009   a. s/p multiple DCCV; b. no coumadin due to low TE risk profile; c. Tikosyn Rx   Coronary atherosclerosis of native coronary artery 11/2002   a. s/p stent to LAD 12/03; OM2 occluded at cath 12/03; d. myoview 5/10: no ischemia;  e. echo 7/11: EF 55%, BAE, mild RVE, PASP 41-45; Myoview was in March 2013. There was no ischemia or infarction, EF 51%    Cutaneous abscess of back excluding buttocks 07/04/2014   Appears to stem from possibly a cyst very large area 6 cm contact surgeon office    Diabetes mellitus without complication ([place])    Drusen body    see opth note   Epiglottitis    w emergency nt intubation   ERECTILE DYSFUNCTION 03/22/2009   GERD 03/22/2009   HYPERGLYCEMIA 04/25/2010   HYPERLIPIDEMIA 03/22/2009   Iliac aneurysm ([place])    2.6 to be evaluated incidental finding on CT   LATERAL EPICONDYLITIS, LEFT 10/24/2009   LIVER FUNCTION TESTS, ABNORMAL 04/25/2010   Local reaction to immunization 05/05/2012   minor resolving  zostavax    Myocardial infarction [facility]) mi2003   Numbness in left leg    foot related to back disease and surgery   Obesity, unspecified 04/24/2009   Perforated appendicitis with necrosis s/p open appendectomy 06/07/14 06/04/2014   Renal  cyst    Characterized by MRI as simple   Ruptured suppurative appendicitis    2015    SLEEP APNEA, OBSTRUCTIVE 03/22/2009   compliant with CPAP   THROMBOCYTOPENIA 08/16/2010   TOBACCO USE, QUIT 10/24/2009   ULNAR NEUROPATHY, LEFT 2/99/3716   Umbilical hernia     Past Surgical History:  Procedure Laterality Date   APPENDECTOMY N/A 06/06/2014   Procedure: APPENDECTOMY;  Surgeon: Odis Hollingshead, MD;  Location: [facility];  Service: General;  Laterality: N/A;  COLON SURGERY     COLONOSCOPY  11/15/2011   Procedure: COLONOSCOPY;  Surgeon: Juanita Craver, MD;  Location: [facility];  Service: Endoscopy;  Laterality: N/A;   COLONOSCOPY WITH PROPOFOL N/A 01/03/2017   Procedure: COLONOSCOPY WITH PROPOFOL;  Surgeon: Carol Ada, MD;  Location: [facility];  Service: Endoscopy;  Laterality: N/A;   COLONOSCOPY WITH PROPOFOL N/A 05/26/2020   Procedure: COLONOSCOPY WITH PROPOFOL;  Surgeon: Carol Ada, MD;  Location: [facility];  Service: Endoscopy;  Laterality: N/A;   cyst on epiglottis  08/2002   ESOPHAGOGASTRODUODENOSCOPY  11/15/2011   Procedure: ESOPHAGOGASTRODUODENOSCOPY (EGD);  Surgeon: Juanita Craver, MD;  Location: [facility];  Service: Endoscopy;  Laterality: N/A;   LAPAROSCOPIC APPENDECTOMY N/A 06/06/2014   Procedure: APPENDECTOMY LAPAROSCOPIC attemted;  Surgeon: Odis Hollingshead, MD;  Location: [facility];  Service: General;  Laterality: N/A;   LUMBAR DISC SURGERY     two  holes in spinalcovering   POLYPECTOMY  05/26/2020   Procedure: POLYPECTOMY;  Surgeon: Carol Ada, MD;  Location: [facility];  Service: Endoscopy;;   stent     LAD DUS 2004   STERNAL WOUND DEBRIDEMENT Left 08/31/2019   Procedure: LEFT STERNOCLAVICULAR WOUND DEBRIDEMENT WITH APPLICATION OF WOUND VAC;  Surgeon: Grace Isaac, MD;  Location: [facility];  Service: Thoracic;  Laterality: Left;   surgery l4-l5   1998   ruptured x 3   ulnar neuropathy     UMBILICAL HERNIA REPAIR     mesh    Social History   Socioeconomic  History   Marital status: Married    Spouse name: Not on file   Number of children: 1   Years of education: 15   Highest education level: Associate degree: occupational, Hotel manager, or vocational program  Occupational History   Occupation: retired    Comment: [employer]  Tobacco Use   Smoking status: Former    Packs/day: 2.00    Years: 42.00    Pack years: 84.00    Types: Cigarettes    Quit date: 12/30/2008    Years since quitting: 12.4   Smokeless tobacco: Never   Tobacco comments:    started at age 26; 1-2 ppd; quit 2010  Vaping Use   Vaping Use: Never used  Substance and Sexual Activity   Alcohol use: No    Alcohol/week: 0.0 standard drinks    Comment: rarely; maybe 1 beer a year   Drug use: No   Sexual activity: Yes    Comment: infrequently  Other Topics Concern   Not on file  Social History Narrative   Retired paramedic   Regular exercise-yes not recently due to SOB   Has one son   Wife is overweight and has fibromyalgia and depression on disability doesn't go out much. Back surgery    Has older dog   Retired from [employer] 31 years of service now 7 years    Caffeine- minimal   Social Determinants of Radio broadcast assistant Strain: Not on file  Food Insecurity: Not on file  Transportation Needs: No Transportation Needs   Lack of Transportation (Medical): No   Lack of Transportation (Non-Medical): No  Physical Activity: Inactive   Days of Exercise per Week: 0 days   Minutes of Exercise per Session: 0 min  Stress: Not on file  Social Connections: Not on file  Intimate Partner Violence: Not on file    Family History  Problem Relation Age of Onset   Thyroid disease Mother    Ovarian cancer Mother  Breast cancer Mother    Lung cancer Father    Cancer Father     ROS: no fevers or chills, productive cough, hemoptysis, dysphasia, odynophagia, melena, hematochezia, dysuria, hematuria, rash, seizure activity, orthopnea, PND, pedal edema, claudication.  Remaining systems are negative.  Physical Exam: Well-developed well-nourished in no acute distress.  Skin is warm and dry.  HEENT is normal.  Neck is supple.  Chest is clear to auscultation with normal expansion.  Cardiovascular exam is regular rate and rhythm.  2/6 systolic and diastolic murmur. Abdominal exam nontender or distended. No masses palpated. Extremities show no edema. neuro grossly intact    A/P  1 Aortic insufficiency-likely moderate on recent transthoracic echocardiogram.  However he does note some dyspnea on exertion.  I would like to evaluate the valve further and we will arrange a transesophageal echocardiogram to reassess.  May require aortic valve replacement in the future.  2 Paroxysmal atrial fibrillation-patient is in sinus rhythm on exam. Continue Tikosyn and apixaban.  3 thoracic aortic aneurysm-plan follow-up CTA June 2023.  4 coronary artery disease-patient denies chest pain.  Continue statin.  No aspirin given need for anticoagulation.  5 hyperlipidemia-continue statin.  6 morbid obesity-we discussed the importance of weight loss.  7 iliac aneurysm-Per vascular surgery.  8 dilated pulmonary artery-suggestive of pulmonary hypertension.  This is likely secondary to obstructive sleep apnea, possible obesity hypoventilation syndrome and COPD.  9 hypertension-blood pressure elevated.  Increase hydralazine to 100 mg p.o. 3 times daily and follow.  [name], MD

## 2021-06-05 NOTE — Progress Notes (Signed)
HPI: FU hx of CAD s/p stent to LAD in 2003, parox AFib, HL, OSA. He has seen Dr. Rayann Heman and ablation could be considered if he lost weight. Previous rash with cardizem; on tikosyn. Has had previous cardioversions for atrial fibrillation. Carotid Dopplers June 2019 showed 1 to 39% bilateral stenosis. Abdominal ultrasound March 2021 showed no aneurysm. The right and left iliac arteries were dilated (followed by vascular surgery). Echocardiogram May 2022 showed normal LV function, grade 1 diastolic dysfunction, moderate left atrial enlargement, mild right atrial enlargement, moderate to severe aortic insufficiency, mild aortic stenosis, mildly dilated ascending aorta at 43 mm.  Chest CT June 2022 showed 4.4 x 4.4 cm aortic aneurysm, dilated pulmonary artery at 4.6 cm, emphysema with areas of fibrosis.  Since last seen, he has some dyspnea on exertion.  No orthopnea, PND, pedal edema, chest pain, palpitations or syncope.  Current Outpatient Medications  Medication Sig Dispense Refill   acetaminophen (TYLENOL) 325 MG tablet Take 650 mg by mouth every 6 (six) hours as needed for mild pain.     albuterol (PROVENTIL HFA;VENTOLIN HFA) 108 (90 Base) MCG/ACT inhaler Inhale 2 puffs into the lungs every 6 (six) hours as needed for wheezing or shortness of breath. 1 Inhaler 0   ALPRAZolam (XANAX) 0.5 MG tablet Take 1 tablet (0.5 mg total) by mouth 2 (two) times daily as needed for anxiety. 30 tablet 0   ANORO ELLIPTA 62.5-25 MCG/INH AEPB INHALE 1 PUFF BY MOUTH EVERY DAY 60 each 5   apixaban (ELIQUIS) 5 MG TABS tablet Take 1 tablet (5 mg total) by mouth 2 (two) times daily. 180 tablet 3   atorvastatin (LIPITOR) 80 MG tablet TAKE 1 TABLET BY MOUTH  DAILY 90 tablet 3   Blood Glucose Monitoring Suppl (ONETOUCH VERIO) w/Device KIT Use to test blood sugars 1-2 times daily. 1 kit 1   brimonidine (ALPHAGAN) 0.15 % ophthalmic solution 1 drop 3 (three) times daily.     busPIRone (BUSPAR) 10 MG tablet Take 1 tablet (10  mg total) by mouth 2 (two) times daily. 180 tablet 1   busPIRone (BUSPAR) 15 MG tablet Take 1/3 tablet p.o. twice daily for 1 week, then take 2/3 tablet p.o. twice daily for 1 week, then take 1 tablet p.o. twice daily 30 tablet 1   Cholecalciferol (VITAMIN D) 50 MCG (2000 UT) tablet Take 2,000 Units by mouth 2 (two) times daily.     diphenhydrAMINE (BENADRYL) 25 MG tablet Take 25-50 mg by mouth at bedtime as needed for sleep.      dofetilide (TIKOSYN) 500 MCG capsule TAKE 1 CAPSULE BY MOUTH  TWICE DAILY 180 capsule 3   famotidine (PEPCID) 10 MG tablet Take 10 mg by mouth as needed for heartburn.      fexofenadine (ALLEGRA) 180 MG tablet Take 180 mg by mouth daily as needed for allergies.      fish oil-omega-3 fatty acids 1000 MG capsule Take 1 g by mouth 2 (two) times daily.     fluticasone (FLONASE) 50 MCG/ACT nasal spray SPRAY 2 SPRAYS IN EACH NOSTRIL DAILY AS NEEDED FOR ALLERGIES 48 mL PRN   hydrALAZINE (APRESOLINE) 100 MG tablet Take 1 tablet (100 mg total) by mouth 2 (two) times daily. 180 tablet 3   KLOR-CON M20 20 MEQ tablet TAKE 1 TABLET BY MOUTH EVERY DAY 90 tablet 3   Lancets (ONETOUCH ULTRASOFT) lancets Twice daily 100 each 6   losartan (COZAAR) 100 MG tablet Take 1 tablet (100 mg total)  by mouth daily. 90 tablet 3   Magnesium 250 MG TABS Take 250 mg by mouth daily. Patient takes 250 mg AM and 500 mg PM     metFORMIN (GLUCOPHAGE-XR) 500 MG 24 hr tablet TAKE 1 TABLET BY MOUTH IN THE AM, AT NOON AND AT BEDTIME 270 tablet 3   methocarbamol (ROBAXIN) 500 MG tablet TAKE 1 TABLET BY MOUTH 4 TIMES A DAY AS NEEDED FOR MUSCLE SPASM 120 tablet 2   MULTIPLE VITAMIN PO Take 1 tablet by mouth every evening.     nitroGLYCERIN (NITROSTAT) 0.4 MG SL tablet Place 1 tablet (0.4 mg total) under the tongue every 5 (five) minutes as needed for chest pain. 25 tablet 12   ONETOUCH VERIO test strip USE AS DIRECTED TWICE A DAY 50 strip 12   pantoprazole (PROTONIX) 40 MG tablet Take 40 mg by mouth daily as  needed (acid reflux).     PARoxetine (PAXIL) 20 MG tablet Take 2.5 tablets (50 mg total) by mouth daily. 90 tablet 1   tamsulosin (FLOMAX) 0.4 MG CAPS capsule TAKE 1 CAPSULE BY MOUTH EVERY DAY (Patient taking differently: Take 0.4 mg by mouth daily as needed (repeat kidney stone).) 30 capsule 5   traZODone (DESYREL) 50 MG tablet Take 1 tablet (50 mg total) by mouth at bedtime. 90 tablet 3   triamcinolone cream (KENALOG) 0.1 % Apply 1 application topically 2 (two) times daily. As needed. (Patient taking differently: Apply 1 application topically 2 (two) times daily as needed (irritation).) 30 g 0   No current facility-administered medications for this visit.     Past Medical History:  Diagnosis Date   Atrial fibrillation (HCC) 03/22/2009   a. s/p multiple DCCV; b. no coumadin due to low TE risk profile; c. Tikosyn Rx   Coronary atherosclerosis of native coronary artery 11/2002   a. s/p stent to LAD 12/03; OM2 occluded at cath 12/03; d. myoview 5/10: no ischemia;  e. echo 7/11: EF 55%, BAE, mild RVE, PASP 41-45; Myoview was in March 2013. There was no ischemia or infarction, EF 51%    Cutaneous abscess of back excluding buttocks 07/04/2014   Appears to stem from possibly a cyst very large area 6 cm contact surgeon office    Diabetes mellitus without complication (HCC)    Drusen body    see opth note   Epiglottitis    w emergency nt intubation   ERECTILE DYSFUNCTION 03/22/2009   GERD 03/22/2009   HYPERGLYCEMIA 04/25/2010   HYPERLIPIDEMIA 03/22/2009   Iliac aneurysm (HCC)    2.6 to be evaluated incidental finding on CT   LATERAL EPICONDYLITIS, LEFT 10/24/2009   LIVER FUNCTION TESTS, ABNORMAL 04/25/2010   Local reaction to immunization 05/05/2012   minor resolving  zostavax    Myocardial infarction (HCC) mi2003   Numbness in left leg    foot related to back disease and surgery   Obesity, unspecified 04/24/2009   Perforated appendicitis with necrosis s/p open appendectomy 06/07/14 06/04/2014   Renal  cyst    Characterized by MRI as simple   Ruptured suppurative appendicitis    2015    SLEEP APNEA, OBSTRUCTIVE 03/22/2009   compliant with CPAP   THROMBOCYTOPENIA 08/16/2010   TOBACCO USE, QUIT 10/24/2009   ULNAR NEUROPATHY, LEFT 03/22/2009   Umbilical hernia     Past Surgical History:  Procedure Laterality Date   APPENDECTOMY N/A 06/06/2014   Procedure: APPENDECTOMY;  Surgeon: Todd J Rosenbower, MD;  Location: [facility];  Service: General;  Laterality: N/A;     COLON SURGERY     COLONOSCOPY  11/15/2011   Procedure: COLONOSCOPY;  Surgeon: Juanita Craver, MD;  Location: [facility];  Service: Endoscopy;  Laterality: N/A;   COLONOSCOPY WITH PROPOFOL N/A 01/03/2017   Procedure: COLONOSCOPY WITH PROPOFOL;  Surgeon: Carol Ada, MD;  Location: [facility];  Service: Endoscopy;  Laterality: N/A;   COLONOSCOPY WITH PROPOFOL N/A 05/26/2020   Procedure: COLONOSCOPY WITH PROPOFOL;  Surgeon: Carol Ada, MD;  Location: [facility];  Service: Endoscopy;  Laterality: N/A;   cyst on epiglottis  08/2002   ESOPHAGOGASTRODUODENOSCOPY  11/15/2011   Procedure: ESOPHAGOGASTRODUODENOSCOPY (EGD);  Surgeon: Juanita Craver, MD;  Location: [facility];  Service: Endoscopy;  Laterality: N/A;   LAPAROSCOPIC APPENDECTOMY N/A 06/06/2014   Procedure: APPENDECTOMY LAPAROSCOPIC attemted;  Surgeon: Odis Hollingshead, MD;  Location: [facility];  Service: General;  Laterality: N/A;   LUMBAR DISC SURGERY     two  holes in spinalcovering   POLYPECTOMY  05/26/2020   Procedure: POLYPECTOMY;  Surgeon: Carol Ada, MD;  Location: [facility];  Service: Endoscopy;;   stent     LAD DUS 2004   STERNAL WOUND DEBRIDEMENT Left 08/31/2019   Procedure: LEFT STERNOCLAVICULAR WOUND DEBRIDEMENT WITH APPLICATION OF WOUND VAC;  Surgeon: Grace Isaac, MD;  Location: [facility];  Service: Thoracic;  Laterality: Left;   surgery l4-l5   1998   ruptured x 3   ulnar neuropathy     UMBILICAL HERNIA REPAIR     mesh    Social History   Socioeconomic  History   Marital status: Married    Spouse name: Not on file   Number of children: 1   Years of education: 15   Highest education level: Associate degree: occupational, Hotel manager, or vocational program  Occupational History   Occupation: retired    Comment: [employer]  Tobacco Use   Smoking status: Former    Packs/day: 2.00    Years: 42.00    Pack years: 84.00    Types: Cigarettes    Quit date: 12/30/2008    Years since quitting: 12.4   Smokeless tobacco: Never   Tobacco comments:    started at age 26; 1-2 ppd; quit 2010  Vaping Use   Vaping Use: Never used  Substance and Sexual Activity   Alcohol use: No    Alcohol/week: 0.0 standard drinks    Comment: rarely; maybe 1 beer a year   Drug use: No   Sexual activity: Yes    Comment: infrequently  Other Topics Concern   Not on file  Social History Narrative   Retired paramedic   Regular exercise-yes not recently due to SOB   Has one son   Wife is overweight and has fibromyalgia and depression on disability doesn't go out much. Back surgery    Has older dog   Retired from [employer] 31 years of service now 7 years    Caffeine- minimal   Social Determinants of Radio broadcast assistant Strain: Not on file  Food Insecurity: Not on file  Transportation Needs: No Transportation Needs   Lack of Transportation (Medical): No   Lack of Transportation (Non-Medical): No  Physical Activity: Inactive   Days of Exercise per Week: 0 days   Minutes of Exercise per Session: 0 min  Stress: Not on file  Social Connections: Not on file  Intimate Partner Violence: Not on file    Family History  Problem Relation Age of Onset   Thyroid disease Mother    Ovarian cancer Mother  Breast cancer Mother    Lung cancer Father    Cancer Father     ROS: no fevers or chills, productive cough, hemoptysis, dysphasia, odynophagia, melena, hematochezia, dysuria, hematuria, rash, seizure activity, orthopnea, PND, pedal edema, claudication.  Remaining systems are negative.  Physical Exam: Well-developed well-nourished in no acute distress.  Skin is warm and dry.  HEENT is normal.  Neck is supple.  Chest is clear to auscultation with normal expansion.  Cardiovascular exam is regular rate and rhythm.  2/6 systolic and diastolic murmur. Abdominal exam nontender or distended. No masses palpated. Extremities show no edema. neuro grossly intact    A/P  1 Aortic insufficiency-likely moderate on recent transthoracic echocardiogram.  However he does note some dyspnea on exertion.  I would like to evaluate the valve further and we will arrange a transesophageal echocardiogram to reassess.  May require aortic valve replacement in the future.  2 Paroxysmal atrial fibrillation-patient is in sinus rhythm on exam. Continue Tikosyn and apixaban.  3 thoracic aortic aneurysm-plan follow-up CTA June 2023.  4 coronary artery disease-patient denies chest pain.  Continue statin.  No aspirin given need for anticoagulation.  5 hyperlipidemia-continue statin.  6 morbid obesity-we discussed the importance of weight loss.  7 iliac aneurysm-Per vascular surgery.  8 dilated pulmonary artery-suggestive of pulmonary hypertension.  This is likely secondary to obstructive sleep apnea, possible obesity hypoventilation syndrome and COPD.  9 hypertension-blood pressure elevated.  Increase hydralazine to 100 mg p.o. 3 times daily and follow.  [name], MD

## 2021-06-07 ENCOUNTER — Ambulatory Visit (INDEPENDENT_AMBULATORY_CARE_PROVIDER_SITE_OTHER): Payer: Medicare Other | Admitting: Behavioral Health

## 2021-06-07 ENCOUNTER — Encounter: Payer: Self-pay | Admitting: Behavioral Health

## 2021-06-07 ENCOUNTER — Other Ambulatory Visit: Payer: Self-pay

## 2021-06-07 DIAGNOSIS — F411 Generalized anxiety disorder: Secondary | ICD-10-CM | POA: Diagnosis not present

## 2021-06-07 MED ORDER — BUSPIRONE HCL 10 MG PO TABS: 10.0000 mg | ORAL_TABLET | Freq: Two times a day (BID) | ORAL | 1 refills | Status: AC

## 2021-06-07 NOTE — Progress Notes (Signed)
[facility]  Patient ID: Burdell, Peed  MRN: [id number]  PCP: Burnis Medin, MD  Date of Evaluation: 06/07/2021 Time spent:20 minutes  Chief Complaint:  Chief Complaint   Anxiety; Follow-up; Medication Problem; Medication Refill     HISTORY/CURRENT STATUS: HPI 70 year male presents to this office today for follow up and medication management. He says that he noticed some improvement while titrating his buspar 15 mg twice a day. However, he says that when he reached the max dose of 30 mg he did not feel well. He described it as feeling kind of "foggy".  He says he ran out of the Buspar prior to this appointment but would like to restart Buspar at lower dose. He said that he only had to take his xanax 3 times this past month for break through anxiety. He reports anxiety today at 3/10 and depression at 0/10. He says that he does have some worry about pending procedure to repair leaky aortic valve. He has follow ups with vascular surgeon this month. Agrees to f/u in 3 months to reassess. Denies mania, no psychosis. No SI/HI.    No prior psychiatric medication failures.  Individual Medical History/ Review of Systems: Changes? :No   Allergies: Pseudoephedrine, Diltiazem, Novocain [procaine], Quinolones, and Sulfonamide derivatives  Current Medications:  Current Outpatient Medications:    busPIRone (BUSPAR) 10 MG tablet, Take 1 tablet (10 mg total) by mouth 2 (two) times daily., Disp: 180 tablet, Rfl: 1   acetaminophen (TYLENOL) 325 MG tablet, Take 650 mg by mouth every 6 (six) hours as needed for mild pain. (Patient not taking: Reported on 05/10/2021), Disp: , Rfl:    albuterol (PROVENTIL HFA;VENTOLIN HFA) 108 (90 Base) MCG/ACT inhaler, Inhale 2 puffs into the lungs every 6 (six) hours as needed for wheezing or shortness of breath., Disp: 1 Inhaler, Rfl: 0   ALPHAGAN P 0.1 % SOLN, Place 1 drop into both eyes 3 (three) times daily. , Disp: , Rfl: 3   ALPRAZolam (XANAX) 0.5  MG tablet, Take 1 tablet (0.5 mg total) by mouth 2 (two) times daily as needed for anxiety., Disp: 30 tablet, Rfl: 0   ANORO ELLIPTA 62.5-25 MCG/INH AEPB, INHALE 1 PUFF BY MOUTH EVERY DAY, Disp: 60 each, Rfl: 5   apixaban (ELIQUIS) 5 MG TABS tablet, Take 1 tablet (5 mg total) by mouth 2 (two) times daily., Disp: 180 tablet, Rfl: 3   atorvastatin (LIPITOR) 80 MG tablet, TAKE 1 TABLET BY MOUTH  DAILY, Disp: 90 tablet, Rfl: 3   Blood Glucose Monitoring Suppl (ONETOUCH VERIO) w/Device KIT, Use to test blood sugars 1-2 times daily., Disp: 1 kit, Rfl: 1   brimonidine (ALPHAGAN) 0.15 % ophthalmic solution, 1 drop 3 (three) times daily., Disp: , Rfl:    busPIRone (BUSPAR) 15 MG tablet, Take 1/3 tablet p.o. twice daily for 1 week, then take 2/3 tablet p.o. twice daily for 1 week, then take 1 tablet p.o. twice daily, Disp: 30 tablet, Rfl: 1   Cholecalciferol (VITAMIN D) 50 MCG (2000 UT) tablet, Take 2,000 Units by mouth 2 (two) times daily., Disp: , Rfl:    diphenhydrAMINE (BENADRYL) 25 MG tablet, Take 25-50 mg by mouth at bedtime as needed for sleep. , Disp: , Rfl:    dofetilide (TIKOSYN) 500 MCG capsule, TAKE 1 CAPSULE BY MOUTH  TWICE DAILY, Disp: 180 capsule, Rfl: 3   famotidine (PEPCID) 10 MG tablet, Take 10 mg by mouth as needed for heartburn. , Disp: , Rfl:  fexofenadine (ALLEGRA) 180 MG tablet, Take 180 mg by mouth daily as needed for allergies. , Disp: , Rfl:    fish oil-omega-3 fatty acids 1000 MG capsule, Take 1 g by mouth 2 (two) times daily., Disp: , Rfl:    fluticasone (FLONASE) 50 MCG/ACT nasal spray, SPRAY 2 SPRAYS IN EACH NOSTRIL DAILY AS NEEDED FOR ALLERGIES, Disp: 48 mL, Rfl: PRN   hydrALAZINE (APRESOLINE) 50 MG tablet, Take 1.5 tablets (75 mg total) by mouth 2 (two) times daily., Disp: 270 tablet, Rfl: 3   KLOR-CON M20 20 MEQ tablet, TAKE 1 TABLET BY MOUTH EVERY DAY, Disp: 90 tablet, Rfl: 3   Lancets (ONETOUCH ULTRASOFT) lancets, Twice daily, Disp: 100 each, Rfl: 6   losartan (COZAAR) 100  MG tablet, Take 1 tablet (100 mg total) by mouth daily., Disp: 90 tablet, Rfl: 3   Magnesium 250 MG TABS, Take 250 mg by mouth daily. Patient takes 250 mg AM and 500 mg PM, Disp: , Rfl:    metFORMIN (GLUCOPHAGE-XR) 500 MG 24 hr tablet, TAKE 1 TABLET BY MOUTH IN THE AM, AT NOON AND AT BEDTIME, Disp: 270 tablet, Rfl: 3   methocarbamol (ROBAXIN) 500 MG tablet, TAKE 1 TABLET BY MOUTH 4 TIMES A DAY AS NEEDED FOR MUSCLE SPASM, Disp: 120 tablet, Rfl: 2   MULTIPLE VITAMIN PO, Take 1 tablet by mouth every evening., Disp: , Rfl:    nitroGLYCERIN (NITROSTAT) 0.4 MG SL tablet, Place 1 tablet (0.4 mg total) under the tongue every 5 (five) minutes as needed for chest pain., Disp: 25 tablet, Rfl: 12   ondansetron (ZOFRAN ODT) 4 MG disintegrating tablet, Take 1 tablet (4 mg total) by mouth every 8 (eight) hours as needed for nausea or vomiting. (Patient not taking: Reported on 05/10/2021), Disp: 24 tablet, Rfl: 0   ONETOUCH VERIO test strip, USE AS DIRECTED TWICE A DAY, Disp: 50 strip, Rfl: 12   pantoprazole (PROTONIX) 40 MG tablet, Take 40 mg by mouth daily as needed (acid reflux).  (Patient not taking: Reported on 05/10/2021), Disp: , Rfl:    PARoxetine (PAXIL) 20 MG tablet, Take 2.5 tablets (50 mg total) by mouth daily., Disp: 90 tablet, Rfl: 1   tamsulosin (FLOMAX) 0.4 MG CAPS capsule, TAKE 1 CAPSULE BY MOUTH EVERY DAY (Patient taking differently: Take 0.4 mg by mouth daily as needed (repeat kidney stone).), Disp: 30 capsule, Rfl: 5   traZODone (DESYREL) 50 MG tablet, Take 1 tablet (50 mg total) by mouth at bedtime., Disp: 90 tablet, Rfl: 3   triamcinolone cream (KENALOG) 0.1 %, Apply 1 application topically 2 (two) times daily. As needed. (Patient taking differently: Apply 1 application topically 2 (two) times daily as needed (irritation).), Disp: 30 g, Rfl: 0 Medication Side Effects: buspar at 30 mg daily dose, "fuzziness", patient did not feel well.   Family Medical/ Social History: Changes? No  MENTAL HEALTH  EXAM:  There were no vitals taken for this visit.There is no height or weight on file to calculate BMI.  General Appearance: Casual, Neat, and Well Groomed  Eye Contact:  Good  Speech:  Clear and Coherent  Volume:  Normal  Mood:  NA  Affect:  Appropriate  Thought Process:  Coherent  Orientation:  Full (Time, Place, and Person)  Thought Content: Logical   Suicidal Thoughts:  No  Homicidal Thoughts:  No  Memory:  WNL  Judgement:  Good  Insight:  Good  Psychomotor Activity:  Normal  Concentration:  Concentration: Good  Recall:  Good  Fund of  Knowledge: Good  Language: Good  Assets:  Desire for Improvement  ADL's:  Intact  Cognition: WNL  Prognosis:  Good    DIAGNOSES:    ICD-10-CM   1. Generalized anxiety disorder  F41.1 busPIRone (BUSPAR) 10 MG tablet      Receiving Psychotherapy: No    RECOMMENDATIONS:  Continue on Paxil 50 mg daily Continue Alprazolam 0.5 mg two times daily PRN Continue trazodone 50 mg at bedtime Restart Buspar 10 mg twice daily Will notify this office if condition worsens or experiencing side effects Recommended psychotherapy To follow up in 3 months to reassess anxiety levels Greater than 50% of  20 min face to face time with patient was spent on counseling and coordination of care. We discussed SE of Buspar when reaching higher dose. Patient felt that medication was working and desires to restart. Discussed long term plan of care. We discussed patients pending worry over procedure or possible aortic valve surgery. Patient agrees to call if he needs assistance before next follow up.   Elwanda Brooklyn, NP

## 2021-06-12 DIAGNOSIS — I10 Essential (primary) hypertension: Secondary | ICD-10-CM

## 2021-06-13 LAB — BASIC METABOLIC PANEL
BUN/Creatinine Ratio: 22 (ref 10–24)
BUN: 35 mg/dL — ABNORMAL HIGH (ref 8–27)
CO2: 21 mmol/L (ref 20–29)
Calcium: 9.6 mg/dL (ref 8.6–10.2)
Chloride: 105 mmol/L (ref 96–106)
Creatinine, Ser: 1.58 mg/dL — ABNORMAL HIGH (ref 0.76–1.27)
Glucose: 194 mg/dL — ABNORMAL HIGH (ref 65–99)
Potassium: 4.9 mmol/L (ref 3.5–5.2)
Sodium: 142 mmol/L (ref 134–144)
eGFR: 46 mL/min/{1.73_m2} — ABNORMAL LOW (ref >59)

## 2021-06-13 MED ORDER — HYDRALAZINE HCL 100 MG PO TABS: 100.0000 mg | ORAL_TABLET | Freq: Two times a day (BID) | ORAL | 3 refills | Status: DC

## 2021-06-14 ENCOUNTER — Encounter: Payer: Medicare Other | Admitting: Cardiothoracic Surgery

## 2021-06-14 ENCOUNTER — Other Ambulatory Visit: Payer: Self-pay

## 2021-06-14 ENCOUNTER — Ambulatory Visit
Admission: RE | Admit: 2021-06-14 | Discharge: 2021-06-14 | Disposition: A | Discharge status: Home / Self Care | Payer: Medicare Other | Source: Ambulatory Visit | Attending: Cardiothoracic Surgery | Admitting: Cardiothoracic Surgery

## 2021-06-14 DIAGNOSIS — I712 Thoracic aortic aneurysm, without rupture, unspecified: Secondary | ICD-10-CM

## 2021-06-14 MED ORDER — IOPAMIDOL (ISOVUE-370) INJECTION 76%
75.0000 mL | Freq: Once | INTRAVENOUS | Status: AC | PRN
  Administered 2021-06-14: 75 mL via INTRAVENOUS

## 2021-06-18 ENCOUNTER — Encounter: Payer: Self-pay | Admitting: Cardiology

## 2021-06-18 ENCOUNTER — Other Ambulatory Visit: Payer: Self-pay

## 2021-06-18 ENCOUNTER — Other Ambulatory Visit: Payer: Self-pay | Admitting: *Deleted

## 2021-06-18 ENCOUNTER — Ambulatory Visit (INDEPENDENT_AMBULATORY_CARE_PROVIDER_SITE_OTHER): Payer: Medicare Other | Admitting: Cardiology

## 2021-06-18 VITALS — BP 136/62 | HR 68 | Ht 73.0 in | Wt 298.2 lb

## 2021-06-18 DIAGNOSIS — I480 Paroxysmal atrial fibrillation: Secondary | ICD-10-CM | POA: Diagnosis not present

## 2021-06-18 DIAGNOSIS — I351 Nonrheumatic aortic (valve) insufficiency: Secondary | ICD-10-CM

## 2021-06-18 DIAGNOSIS — I10 Essential (primary) hypertension: Secondary | ICD-10-CM | POA: Diagnosis not present

## 2021-06-18 DIAGNOSIS — I2510 Atherosclerotic heart disease of native coronary artery without angina pectoris: Secondary | ICD-10-CM | POA: Diagnosis not present

## 2021-06-18 DIAGNOSIS — E7800 Pure hypercholesterolemia, unspecified: Secondary | ICD-10-CM

## 2021-06-18 MED ORDER — HYDRALAZINE HCL 100 MG PO TABS: 100.0000 mg | ORAL_TABLET | Freq: Three times a day (TID) | ORAL | 3 refills | Status: AC

## 2021-06-18 NOTE — Patient Instructions (Signed)
  You are scheduled for a TEE on 07/10/21 with Dr. Stanford Breed.  Please arrive at the [facility] (Main Entrance A) at [facility]: [address] , [place] [address] at 9 am (1 hour prior to procedure unless lab work is needed; if lab work is needed arrive 1.5 hours ahead)  DIET: Nothing to eat or drink after midnight except a sip of water with medications (see medication instructions below)  FYI: For your safety, and to allow [place] to monitor your vital signs accurately during the surgery/procedure we request that   if you have artificial nails, gel coating, SNS etc. Please have those removed prior to your surgery/procedure. Not having the nail coverings /polish removed may result in cancellation or delay of your surgery/procedure.   Medication Instructions:  INCREASE HYDRALAZINE TO 100 MG THREE TIMES DAILY  You must have a responsible person to drive you home and stay in the waiting area during your procedure. Failure to do so could result in cancellation.  Bring your insurance cards.  *Special Note: Every effort is made to have your procedure done on time. Occasionally there are emergencies that occur at the hospital that may cause delays. Please be patient if a delay does occur.    Your physician recommends that you schedule a follow-up appointment in: [place]

## 2021-06-21 ENCOUNTER — Ambulatory Visit (INDEPENDENT_AMBULATORY_CARE_PROVIDER_SITE_OTHER): Payer: Medicare Other | Admitting: Cardiothoracic Surgery

## 2021-06-21 ENCOUNTER — Other Ambulatory Visit: Payer: Self-pay

## 2021-06-21 ENCOUNTER — Encounter: Payer: Self-pay | Admitting: Cardiothoracic Surgery

## 2021-06-21 DIAGNOSIS — I712 Thoracic aortic aneurysm, without rupture, unspecified: Secondary | ICD-10-CM

## 2021-06-21 NOTE — Progress Notes (Signed)
Chief complaint: Aneurysm surveillance  History of present illness: 70 year old man previously treated by Dr. Servando Snare.  He had a left sternoclavicular abscess which was managed with wound VAC and ultimately healed.  At the time of his evaluation he was also determined to have an ascending aortic aneurysm.  In the meantime, he is been noted to have fatigue and shortness of breath and moderate to severe aortic valve insufficiency.  He is scheduled to undergo transesophageal echocardiogram in mid July to further assess the aortic valve.  He is not currently on diuretic therapy.  His cardiac history is otherwise notable for history of LAD stenting.  He denies any recurrent chest pain.  He also has a history of atrial fibrillation and has undergone cardioversion multiple times.  To his knowledge, he has remained in sinus rhythm for several months.  Active Ambulatory Problems    Diagnosis Date Noted   Hyperlipidemia 03/22/2009   OBESITY, UNSPECIFIED 04/24/2009   ERECTILE DYSFUNCTION 03/22/2009   Obstructive sleep apnea 03/22/2009   ULNAR NEUROPATHY, LEFT 03/22/2009   MYOCARDIAL INFARCTION, HX OF 03/22/2009   Coronary atherosclerosis 03/22/2009   Atrial fibrillation with RVR ([place]) 03/22/2009   GERD 03/22/2009   Lateral epicondylitis 10/24/2009   PALPITATIONS 08/16/2010   LIVER FUNCTION TESTS, ABNORMAL 04/25/2010   TOBACCO USE, QUIT 10/24/2009   Personal history of colonic polyps 05/05/2011   Visit for preventive health examination 05/05/2011   Retinal tear 04/29/2012   High risk medication use 05/05/2012   Anticoagulation management encounter 05/05/2012   Decreased hearing 08/06/2012   Hemorrhoids 08/06/2012   Sleep disorder 06/21/2013   Chronic anticoagulation 12/21/2013   Morbid obesity ([place]) 07/08/2014   Type 2 diabetes mellitus with other circulatory complications ([place]) 89[birth year]/1941   Iliac aneurysm (HCC)    Atrial fibrillation ([place]) 01/01/2016   Thrombocytopenia ([place]) 06/25/2016   Right  hip pain 09/04/2017   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   Myelopathy ([place]) 08/27/2019   H/O optic neuritis 08/27/2019   Left shoulder pain 08/30/2019   Right thalamic infarction ([place])    Hypoalbuminemia due to protein-calorie malnutrition ([place])    Diabetes mellitus type 2 in obese ([place])    Controlled type 2 diabetes mellitus with hyperglycemia, without long-term current use of insulin (HCC)    Elevated BUN    Postoperative pain    Recurrent cellulitis of lower leg 01/13/2020   Biceps tendinitis of both shoulders 02/16/2020   Thoracic aortic aneurysm without rupture ([place]) 06/21/2021   Resolved Ambulatory Problems    Diagnosis Date Noted   THROMBOCYTOPENIA 08/16/2010   Bronchitis 03/01/2011   Wheezing 03/01/2011   Local reaction to immunization 05/05/2012   Perforated appendicitis with necrosis s/p open appendectomy 06/07/14 06/04/2014   Postoperative wound infection 06/20/2014   Cutaneous abscess of back excluding buttocks 07/04/2014   Abscess of back 07/05/2014   Acute back pain with sciatica, right 09/04/2017   Back pain 08/27/2019   MSSA bacteremia 08/30/2019   Discitis of lumbar region 08/30/2019   Septic arthritis of sternoclavicular joint, left ([place]) 08/30/2019   Suspected infectious meningitis 09/01/2019   Diskitis 09/15/2019   Hypokalemia    Hypomagnesemia    Atrial fibrillation with rapid ventricular response (HCC)    Nausea without vomiting    Labile blood pressure    PICC (peripherally inserted central catheter) in place 10/18/2019   Past Medical History:  Diagnosis Date   Coronary atherosclerosis of native coronary artery 11/2002   Diabetes mellitus without complication ([place])    Drusen body  Epiglottitis    HYPERGLYCEMIA 04/25/2010   HYPERLIPIDEMIA 03/22/2009   LATERAL EPICONDYLITIS, LEFT 10/24/2009   Myocardial infarction [facility]) mi2003   Numbness in left leg    Renal cyst    Ruptured suppurative appendicitis    SLEEP APNEA, OBSTRUCTIVE 9/52/8413    Umbilical hernia      Current Outpatient Medications on File Prior to Visit  Medication Sig Dispense Refill   acetaminophen (TYLENOL) 325 MG tablet Take 650 mg by mouth every 6 (six) hours as needed for mild pain.     albuterol (PROVENTIL HFA;VENTOLIN HFA) 108 (90 Base) MCG/ACT inhaler Inhale 2 puffs into the lungs every 6 (six) hours as needed for wheezing or shortness of breath. 1 Inhaler 0   ALPRAZolam (XANAX) 0.5 MG tablet Take 1 tablet (0.5 mg total) by mouth 2 (two) times daily as needed for anxiety. 30 tablet 0   ANORO ELLIPTA 62.5-25 MCG/INH AEPB INHALE 1 PUFF BY MOUTH EVERY DAY 60 each 5   apixaban (ELIQUIS) 5 MG TABS tablet Take 1 tablet (5 mg total) by mouth 2 (two) times daily. 180 tablet 3   atorvastatin (LIPITOR) 80 MG tablet TAKE 1 TABLET BY MOUTH  DAILY 90 tablet 3   Blood Glucose Monitoring Suppl (ONETOUCH VERIO) w/Device KIT Use to test blood sugars 1-2 times daily. 1 kit 1   brimonidine (ALPHAGAN) 0.15 % ophthalmic solution 1 drop 3 (three) times daily.     busPIRone (BUSPAR) 10 MG tablet Take 1 tablet (10 mg total) by mouth 2 (two) times daily. 180 tablet 1   Cholecalciferol (VITAMIN D) 50 MCG (2000 UT) tablet Take 2,000 Units by mouth 2 (two) times daily.     diphenhydrAMINE (BENADRYL) 25 MG tablet Take 25-50 mg by mouth at bedtime as needed for sleep.      dofetilide (TIKOSYN) 500 MCG capsule TAKE 1 CAPSULE BY MOUTH  TWICE DAILY 180 capsule 3   famotidine (PEPCID) 10 MG tablet Take 10 mg by mouth as needed for heartburn.      fexofenadine (ALLEGRA) 180 MG tablet Take 180 mg by mouth daily as needed for allergies.      fish oil-omega-3 fatty acids 1000 MG capsule Take 1 g by mouth 2 (two) times daily.     fluticasone (FLONASE) 50 MCG/ACT nasal spray SPRAY 2 SPRAYS IN EACH NOSTRIL DAILY AS NEEDED FOR ALLERGIES 48 mL PRN   hydrALAZINE (APRESOLINE) 100 MG tablet Take 1 tablet (100 mg total) by mouth 3 (three) times daily. 270 tablet 3   KLOR-CON M20 20 MEQ tablet TAKE 1 TABLET  BY MOUTH EVERY DAY 90 tablet 3   Lancets (ONETOUCH ULTRASOFT) lancets Twice daily 100 each 6   losartan (COZAAR) 100 MG tablet Take 1 tablet (100 mg total) by mouth daily. 90 tablet 3   Magnesium 250 MG TABS Take 250 mg by mouth daily. Patient takes 250 mg AM and 500 mg PM     metFORMIN (GLUCOPHAGE-XR) 500 MG 24 hr tablet TAKE 1 TABLET BY MOUTH IN THE AM, AT NOON AND AT BEDTIME 270 tablet 3   methocarbamol (ROBAXIN) 500 MG tablet TAKE 1 TABLET BY MOUTH 4 TIMES A DAY AS NEEDED FOR MUSCLE SPASM 120 tablet 2   MULTIPLE VITAMIN PO Take 1 tablet by mouth every evening.     nitroGLYCERIN (NITROSTAT) 0.4 MG SL tablet Place 1 tablet (0.4 mg total) under the tongue every 5 (five) minutes as needed for chest pain. 25 tablet 12   ONETOUCH VERIO test strip USE AS  DIRECTED TWICE A DAY 50 strip 12   pantoprazole (PROTONIX) 40 MG tablet Take 40 mg by mouth daily as needed (acid reflux).     PARoxetine (PAXIL) 20 MG tablet Take 2.5 tablets (50 mg total) by mouth daily. 90 tablet 1   tamsulosin (FLOMAX) 0.4 MG CAPS capsule TAKE 1 CAPSULE BY MOUTH EVERY DAY (Patient taking differently: Take 0.4 mg by mouth daily as needed (repeat kidney stone).) 30 capsule 5   traZODone (DESYREL) 50 MG tablet Take 1 tablet (50 mg total) by mouth at bedtime. 90 tablet 3   triamcinolone cream (KENALOG) 0.1 % Apply 1 application topically 2 (two) times daily. As needed. (Patient taking differently: Apply 1 application topically 2 (two) times daily as needed (irritation).) 30 g 0   busPIRone (BUSPAR) 15 MG tablet Take 1/3 tablet p.o. twice daily for 1 week, then take 2/3 tablet p.o. twice daily for 1 week, then take 1 tablet p.o. twice daily 30 tablet 1   No current facility-administered medications on file prior to visit.     Physical exam:  BP (!) 156/64 (BP Location: Left Arm, Patient Position: Sitting, Cuff Size: Large)   Pulse 73   Resp 20   Ht _0  (1.854 m)   Wt 135.1 kg   SpO2 91% Comment: RA  BMI 39.29 kg/m   Well-appearing man no acute distress HEENT: Normocephalic atraumatic except for healed left clavicular scar Chest: clear to auscultation bilaterally Heart: Regular rate and rhythm, 2/6 diastolic murmur at apex Extremities: Evidence of chronic venous disease bilaterally; prominent systolic pulse phase consistent with waterhammer pulse  Imaging: I personally reviewed his available imaging studies including CT scan of the chest from June 14, 2021 which demonstrates stable sub-5 cm ascending aortic aneurysm. I have also reviewed his echocardiogram from 05/25/2021 which demonstrates dilated left ventricle and moderate to severe aortic valve insufficiency.  Assessment/plan: 70 year old man with aneurysm which is not worrisome at this point.  However, his heart failure symptoms and severe aortic regurgitation are more worrisome. We will plan to follow-up with him after TEE in July.  I strongly suspect he will need aortic valve replacement and will need to undergo a left and right heart catheterization for the procedure.  We will plan to coordinate with Dr. Stanford Breed who is performing the TEE.  [name] [name] Z. Orvan Seen, [place]

## 2021-07-06 ENCOUNTER — Other Ambulatory Visit: Payer: Self-pay | Admitting: Internal Medicine

## 2021-07-08 ENCOUNTER — Other Ambulatory Visit: Payer: Self-pay | Admitting: Behavioral Health

## 2021-07-08 DIAGNOSIS — F411 Generalized anxiety disorder: Secondary | ICD-10-CM

## 2021-07-10 ENCOUNTER — Encounter (HOSPITAL_COMMUNITY): Payer: Self-pay | Admitting: Cardiology

## 2021-07-10 ENCOUNTER — Other Ambulatory Visit: Payer: Self-pay

## 2021-07-10 ENCOUNTER — Ambulatory Visit (HOSPITAL_BASED_OUTPATIENT_CLINIC_OR_DEPARTMENT_OTHER): Payer: Medicare Other

## 2021-07-10 ENCOUNTER — Ambulatory Visit (HOSPITAL_COMMUNITY)
Admission: RE | Admit: 2021-07-10 | Discharge: 2021-07-10 | Disposition: A | Discharge status: Home / Self Care | Payer: Medicare Other | Attending: Cardiology | Admitting: Cardiology

## 2021-07-10 ENCOUNTER — Ambulatory Visit (HOSPITAL_COMMUNITY): Payer: Medicare Other | Admitting: Anesthesiology

## 2021-07-10 ENCOUNTER — Encounter (HOSPITAL_COMMUNITY)
Admission: RE | Disposition: A | Discharge status: Home / Self Care | Payer: Self-pay | Source: Home / Self Care | Attending: Cardiology

## 2021-07-10 DIAGNOSIS — G4733 Obstructive sleep apnea (adult) (pediatric): Secondary | ICD-10-CM | POA: Diagnosis not present

## 2021-07-10 DIAGNOSIS — I2510 Atherosclerotic heart disease of native coronary artery without angina pectoris: Secondary | ICD-10-CM | POA: Insufficient documentation

## 2021-07-10 DIAGNOSIS — Z79899 Other long term (current) drug therapy: Secondary | ICD-10-CM | POA: Insufficient documentation

## 2021-07-10 DIAGNOSIS — E785 Hyperlipidemia, unspecified: Secondary | ICD-10-CM | POA: Insufficient documentation

## 2021-07-10 DIAGNOSIS — I351 Nonrheumatic aortic (valve) insufficiency: Secondary | ICD-10-CM | POA: Diagnosis not present

## 2021-07-10 DIAGNOSIS — Z6839 Body mass index (BMI) 39.0-39.9, adult: Secondary | ICD-10-CM | POA: Insufficient documentation

## 2021-07-10 DIAGNOSIS — I352 Nonrheumatic aortic (valve) stenosis with insufficiency: Secondary | ICD-10-CM | POA: Diagnosis present

## 2021-07-10 DIAGNOSIS — I728 Aneurysm of other specified arteries: Secondary | ICD-10-CM | POA: Insufficient documentation

## 2021-07-10 DIAGNOSIS — Z87891 Personal history of nicotine dependence: Secondary | ICD-10-CM | POA: Diagnosis not present

## 2021-07-10 DIAGNOSIS — I700 Atherosclerosis of aorta: Secondary | ICD-10-CM | POA: Insufficient documentation

## 2021-07-10 DIAGNOSIS — I480 Paroxysmal atrial fibrillation: Secondary | ICD-10-CM | POA: Insufficient documentation

## 2021-07-10 DIAGNOSIS — I10 Essential (primary) hypertension: Secondary | ICD-10-CM | POA: Insufficient documentation

## 2021-07-10 DIAGNOSIS — I340 Nonrheumatic mitral (valve) insufficiency: Secondary | ICD-10-CM | POA: Diagnosis not present

## 2021-07-10 DIAGNOSIS — Z7901 Long term (current) use of anticoagulants: Secondary | ICD-10-CM | POA: Insufficient documentation

## 2021-07-10 DIAGNOSIS — Z7984 Long term (current) use of oral hypoglycemic drugs: Secondary | ICD-10-CM | POA: Insufficient documentation

## 2021-07-10 DIAGNOSIS — I712 Thoracic aortic aneurysm, without rupture: Secondary | ICD-10-CM | POA: Insufficient documentation

## 2021-07-10 HISTORY — PX: TEE WITHOUT CARDIOVERSION: SHX5443

## 2021-07-10 LAB — GLUCOSE, CAPILLARY: Glucose-Capillary: 138 mg/dL — ABNORMAL HIGH (ref 70–99)

## 2021-07-10 LAB — ECHO TEE: P 1/2 time: 485 msec

## 2021-07-10 SURGERY — ECHOCARDIOGRAM, TRANSESOPHAGEAL
Anesthesia: Monitor Anesthesia Care

## 2021-07-10 MED ORDER — PHENYLEPHRINE 40 MCG/ML (10ML) SYRINGE FOR IV PUSH (FOR BLOOD PRESSURE SUPPORT)
PREFILLED_SYRINGE | INTRAVENOUS | Status: DC | PRN
  Administered 2021-07-10: 80 ug via INTRAVENOUS

## 2021-07-10 MED ORDER — PROPOFOL 10 MG/ML IV BOLUS
INTRAVENOUS | Status: DC | PRN
  Administered 2021-07-10: 10 mg via INTRAVENOUS

## 2021-07-10 MED ORDER — PROPOFOL 500 MG/50ML IV EMUL
INTRAVENOUS | Status: DC | PRN
  Administered 2021-07-10: 125 ug/kg/min via INTRAVENOUS

## 2021-07-10 MED ORDER — SODIUM CHLORIDE 0.9 % IV SOLN: INTRAVENOUS | Status: DC

## 2021-07-10 MED ORDER — LIDOCAINE 2% (20 MG/ML) 5 ML SYRINGE
INTRAMUSCULAR | Status: DC | PRN
  Administered 2021-07-10: 100 mg via INTRAVENOUS

## 2021-07-10 MED ORDER — EPHEDRINE SULFATE-NACL 50-0.9 MG/10ML-% IV SOSY
PREFILLED_SYRINGE | INTRAVENOUS | Status: DC | PRN
  Administered 2021-07-10: 10 mg via INTRAVENOUS

## 2021-07-10 NOTE — Anesthesia Postprocedure Evaluation (Signed)
Anesthesia Post Note  Patient: [name] [name].  Procedure(s) Performed: TRANSESOPHAGEAL ECHOCARDIOGRAM (TEE)     Patient location during evaluation: Endoscopy Anesthesia Type: MAC Level of consciousness: awake and alert Pain management: pain level controlled Vital Signs Assessment: post-procedure vital signs reviewed and stable Respiratory status: spontaneous breathing, nonlabored ventilation, respiratory function stable and patient connected to nasal cannula oxygen Cardiovascular status: stable and blood pressure returned to baseline Postop Assessment: no apparent nausea or vomiting Anesthetic complications: no   No notable events documented.  Last Vitals:  Vitals:   07/10/21 0953 07/10/21 1003  BP: (!) 124/47 (!) 123/46  Pulse: (!) 54 (!) 51  Resp: 19 (!) 21  Temp: 36.6 C   SpO2: 91% 90%    Last Pain:  Vitals:   07/10/21 1003  TempSrc:   PainSc: 0-No pain                 Gamble Enderle,Muhannad DANIEL

## 2021-07-10 NOTE — H&P (Signed)
Office Visit  06/18/2021 [facility] Wendy Poet, MD  Cardiology Paroxysmal atrial fibrillation [facility]) +4 more  Dx Follow-up  Coronary Artery Disease ; Referred by Burnis Medin, MD  Reason for Visit    Additional Documentation  Vitals:  BP 136/62 Pulse 68 Ht _0  (1.854 m) Wt 135.3 kg BMI 39.34 kg/m BSA 2.64 m  More Vitals  Flowsheets:  NEWS, MEWS Score, Anthropometrics   Encounter Info:  Billing Info, History, Allergies, Detailed Report    All Notes    Progress Notes by Lelon Perla, MD at 06/18/2021 10:00 AM  Author: Lelon Perla, MD Author Type: Physician Filed: 06/18/2021 10:33 AM  Note Status: Signed Cosign: Cosign Not Required Encounter Date: 06/18/2021  Editor: Lelon Perla, MD (Physician)                        HPI: FU hx of CAD s/p stent to LAD in 2003, parox AFib, HL, OSA. He has seen Dr. Rayann Heman and ablation could be considered if he lost weight. Previous rash with cardizem; on tikosyn. Has had previous cardioversions for atrial fibrillation. Carotid Dopplers June 2019 showed 1 to 39% bilateral stenosis. Abdominal ultrasound March 2021 showed no aneurysm. The right and left iliac arteries were dilated (followed by vascular surgery). Echocardiogram May 2022 showed normal LV function, grade 1 diastolic dysfunction, moderate left atrial enlargement, mild right atrial enlargement, moderate to severe aortic insufficiency, mild aortic stenosis, mildly dilated ascending aorta at 43 mm.  Chest CT June 2022 showed 4.4 x 4.4 cm aortic aneurysm, dilated pulmonary artery at 4.6 cm, emphysema with areas of fibrosis.  Since last seen, he has some dyspnea on exertion.  No orthopnea, PND, pedal edema, chest pain, palpitations or syncope.         Current Outpatient Medications  Medication Sig Dispense Refill   acetaminophen (TYLENOL) 325 MG tablet Take 650 mg by mouth every 6 (six) hours as needed for mild pain.       albuterol  (PROVENTIL HFA;VENTOLIN HFA) 108 (90 Base) MCG/ACT inhaler Inhale 2 puffs into the lungs every 6 (six) hours as needed for wheezing or shortness of breath. 1 Inhaler 0   ALPRAZolam (XANAX) 0.5 MG tablet Take 1 tablet (0.5 mg total) by mouth 2 (two) times daily as needed for anxiety. 30 tablet 0   ANORO ELLIPTA 62.5-25 MCG/INH AEPB INHALE 1 PUFF BY MOUTH EVERY DAY 60 each 5   apixaban (ELIQUIS) 5 MG TABS tablet Take 1 tablet (5 mg total) by mouth 2 (two) times daily. 180 tablet 3   atorvastatin (LIPITOR) 80 MG tablet TAKE 1 TABLET BY MOUTH  DAILY 90 tablet 3   Blood Glucose Monitoring Suppl (ONETOUCH VERIO) w/Device KIT Use to test blood sugars 1-2 times daily. 1 kit 1   brimonidine (ALPHAGAN) 0.15 % ophthalmic solution 1 drop 3 (three) times daily.       busPIRone (BUSPAR) 10 MG tablet Take 1 tablet (10 mg total) by mouth 2 (two) times daily. 180 tablet 1   busPIRone (BUSPAR) 15 MG tablet Take 1/3 tablet p.o. twice daily for 1 week, then take 2/3 tablet p.o. twice daily for 1 week, then take 1 tablet p.o. twice daily 30 tablet 1   Cholecalciferol (VITAMIN D) 50 MCG (2000 UT) tablet Take 2,000 Units by mouth 2 (two) times daily.       diphenhydrAMINE (BENADRYL) 25 MG tablet Take 25-50 mg by mouth at bedtime as needed  for sleep.       dofetilide (TIKOSYN) 500 MCG capsule TAKE 1 CAPSULE BY MOUTH  TWICE DAILY 180 capsule 3   famotidine (PEPCID) 10 MG tablet Take 10 mg by mouth as needed for heartburn.       fexofenadine (ALLEGRA) 180 MG tablet Take 180 mg by mouth daily as needed for allergies.       fish oil-omega-3 fatty acids 1000 MG capsule Take 1 g by mouth 2 (two) times daily.       fluticasone (FLONASE) 50 MCG/ACT nasal spray SPRAY 2 SPRAYS IN EACH NOSTRIL DAILY AS NEEDED FOR ALLERGIES 48 mL PRN   hydrALAZINE (APRESOLINE) 100 MG tablet Take 1 tablet (100 mg total) by mouth 2 (two) times daily. 180 tablet 3   KLOR-CON M20 20 MEQ tablet TAKE 1 TABLET BY MOUTH EVERY DAY 90 tablet 3   Lancets  (ONETOUCH ULTRASOFT) lancets Twice daily 100 each 6   losartan (COZAAR) 100 MG tablet Take 1 tablet (100 mg total) by mouth daily. 90 tablet 3   Magnesium 250 MG TABS Take 250 mg by mouth daily. Patient takes 250 mg AM and 500 mg PM       metFORMIN (GLUCOPHAGE-XR) 500 MG 24 hr tablet TAKE 1 TABLET BY MOUTH IN THE AM, AT NOON AND AT BEDTIME 270 tablet 3   methocarbamol (ROBAXIN) 500 MG tablet TAKE 1 TABLET BY MOUTH 4 TIMES A DAY AS NEEDED FOR MUSCLE SPASM 120 tablet 2   MULTIPLE VITAMIN PO Take 1 tablet by mouth every evening.       nitroGLYCERIN (NITROSTAT) 0.4 MG SL tablet Place 1 tablet (0.4 mg total) under the tongue every 5 (five) minutes as needed for chest pain. 25 tablet 12   ONETOUCH VERIO test strip USE AS DIRECTED TWICE A DAY 50 strip 12   pantoprazole (PROTONIX) 40 MG tablet Take 40 mg by mouth daily as needed (acid reflux).       PARoxetine (PAXIL) 20 MG tablet Take 2.5 tablets (50 mg total) by mouth daily. 90 tablet 1   tamsulosin (FLOMAX) 0.4 MG CAPS capsule TAKE 1 CAPSULE BY MOUTH EVERY DAY (Patient taking differently: Take 0.4 mg by mouth daily as needed (repeat kidney stone).) 30 capsule 5   traZODone (DESYREL) 50 MG tablet Take 1 tablet (50 mg total) by mouth at bedtime. 90 tablet 3   triamcinolone cream (KENALOG) 0.1 % Apply 1 application topically 2 (two) times daily. As needed. (Patient taking differently: Apply 1 application topically 2 (two) times daily as needed (irritation).) 30 g 0    No current facility-administered medications for this visit.            Past Medical History:  Diagnosis Date   Atrial fibrillation ([place]) 03/22/2009    a. s/p multiple DCCV; b. no coumadin due to low TE risk profile; c. Tikosyn Rx   Coronary atherosclerosis of native coronary artery 11/2002    a. s/p stent to LAD 12/03; OM2 occluded at cath 12/03; d. myoview 5/10: no ischemia;  e. echo 7/11: EF 55%, BAE, mild RVE, PASP 41-45; Myoview was in March 2013. There was no ischemia or  infarction, EF 51%   Cutaneous abscess of back excluding buttocks 07/04/2014    Appears to stem from possibly a cyst very large area 6 cm contact surgeon office   Diabetes mellitus without complication ([place])     Drusen body      see opth note   Epiglottitis  w emergency nt intubation   ERECTILE DYSFUNCTION 03/22/2009   GERD 03/22/2009   HYPERGLYCEMIA 04/25/2010   HYPERLIPIDEMIA 03/22/2009   Iliac aneurysm (HCC)      2.6 to be evaluated incidental finding on CT   LATERAL EPICONDYLITIS, LEFT 10/24/2009   LIVER FUNCTION TESTS, ABNORMAL 04/25/2010   Local reaction to immunization 05/05/2012    minor resolving  zostavax   Myocardial infarction [facility]) mi2003   Numbness in left leg      foot related to back disease and surgery   Obesity, unspecified 04/24/2009   Perforated appendicitis with necrosis s/p open appendectomy 06/07/14 06/04/2014   Renal cyst      Characterized by MRI as simple   Ruptured suppurative appendicitis      2015   SLEEP APNEA, OBSTRUCTIVE 03/22/2009    compliant with CPAP   THROMBOCYTOPENIA 08/16/2010   TOBACCO USE, QUIT 10/24/2009   ULNAR NEUROPATHY, LEFT 04/30/7740   Umbilical hernia             Past Surgical History:  Procedure Laterality Date   APPENDECTOMY N/A 06/06/2014    Procedure: APPENDECTOMY;  Surgeon: Odis Hollingshead, MD;  Location: [facility];  Service: General;  Laterality: N/A;   COLON SURGERY       COLONOSCOPY   11/15/2011    Procedure: COLONOSCOPY;  Surgeon: Juanita Craver, MD;  Location: [facility];  Service: Endoscopy;  Laterality: N/A;   COLONOSCOPY WITH PROPOFOL N/A 01/03/2017    Procedure: COLONOSCOPY WITH PROPOFOL;  Surgeon: Carol Ada, MD;  Location: [facility];  Service: Endoscopy;  Laterality: N/A;   COLONOSCOPY WITH PROPOFOL N/A 05/26/2020    Procedure: COLONOSCOPY WITH PROPOFOL;  Surgeon: Carol Ada, MD;  Location: [facility];  Service: Endoscopy;  Laterality: N/A;   cyst on epiglottis   08/2002   ESOPHAGOGASTRODUODENOSCOPY   11/15/2011     Procedure: ESOPHAGOGASTRODUODENOSCOPY (EGD);  Surgeon: Juanita Craver, MD;  Location: [facility];  Service: Endoscopy;  Laterality: N/A;   LAPAROSCOPIC APPENDECTOMY N/A 06/06/2014    Procedure: APPENDECTOMY LAPAROSCOPIC attemted;  Surgeon: Odis Hollingshead, MD;  Location: [facility];  Service: General;  Laterality: N/A;   LUMBAR DISC SURGERY        two  holes in spinalcovering   POLYPECTOMY   05/26/2020    Procedure: POLYPECTOMY;  Surgeon: Carol Ada, MD;  Location: [facility];  Service: Endoscopy;;   stent        LAD DUS 2004   STERNAL WOUND DEBRIDEMENT Left 08/31/2019    Procedure: LEFT STERNOCLAVICULAR WOUND DEBRIDEMENT WITH APPLICATION OF WOUND VAC;  Surgeon: Grace Isaac, MD;  Location: [facility];  Service: Thoracic;  Laterality: Left;   surgery l4-l5   1998    ruptured x 3   ulnar neuropathy       UMBILICAL HERNIA REPAIR        mesh      Social History         Socioeconomic History   Marital status: Married      Spouse name: Not on file   Number of children: 1   Years of education: 15   Highest education level: Associate degree: occupational, Hotel manager, or vocational program  Occupational History   Occupation: retired      Comment: [employer]  Tobacco Use   Smoking status: Former      Packs/day: 2.00      Years: 42.00      Pack years: 84.00      Types: Cigarettes  Quit date: 12/30/2008      Years since quitting: 12.4   Smokeless tobacco: Never   Tobacco comments:      started at age 53; 1-2 ppd; quit 2010  Vaping Use   Vaping Use: Never used  Substance and Sexual Activity   Alcohol use: No      Alcohol/week: 0.0 standard drinks      Comment: rarely; maybe 1 beer a year   Drug use: No   Sexual activity: Yes      Comment: infrequently  Other Topics Concern   Not on file  Social History Narrative    Retired paramedic    Regular exercise-yes not recently due to SOB    Has one son    Wife is overweight and has fibromyalgia and depression on  disability doesn't go out much. Back surgery    Has older dog    Retired from [employer] 31 years of service now 7 years    Caffeine- minimal    Social Determinants of Scientist, research (life sciences) Strain: Not on file  Food Insecurity: Not on file  Transportation Needs: No Transportation Needs   Lack of Transportation (Medical): No   Lack of Transportation (Non-Medical): No  Physical Activity: Inactive   Days of Exercise per Week: 0 days   Minutes of Exercise per Session: 0 min  Stress: Not on file  Social Connections: Not on file  Intimate Partner Violence: Not on file           Family History  Problem Relation Age of Onset   Thyroid disease Mother     Ovarian cancer Mother     Breast cancer Mother     Lung cancer Father     Cancer Father        ROS: no fevers or chills, productive cough, hemoptysis, dysphasia, odynophagia, melena, hematochezia, dysuria, hematuria, rash, seizure activity, orthopnea, PND, pedal edema, claudication. Remaining systems are negative.   Physical Exam: Well-developed well-nourished in no acute distress. Skin is warm and dry. HEENT is normal. Neck is supple. Chest is clear to auscultation with normal expansion. Cardiovascular exam is regular rate and rhythm.  2/6 systolic and diastolic murmur. Abdominal exam nontender or distended. No masses palpated. Extremities show no edema. neuro grossly intact       A/P   1 Aortic insufficiency-likely moderate on recent transthoracic echocardiogram.  However he does note some dyspnea on exertion.  I would like to evaluate the valve further and we will arrange a transesophageal echocardiogram to reassess.  May require aortic valve replacement in the future.   2 Paroxysmal atrial fibrillation-patient is in sinus rhythm on exam. Continue Tikosyn and apixaban.   3 thoracic aortic aneurysm-plan follow-up CTA June 2023.   4 coronary artery disease-patient denies chest pain.  Continue statin.  No aspirin  given need for anticoagulation.   5 hyperlipidemia-continue statin.   6 morbid obesity-we discussed the importance of weight loss.   7 iliac aneurysm-Per vascular surgery.   8 dilated pulmonary artery-suggestive of pulmonary hypertension.  This is likely secondary to obstructive sleep apnea, possible obesity hypoventilation syndrome and COPD.   9 hypertension-blood pressure elevated.  Increase hydralazine to 100 mg p.o. 3 times daily and follow.   [name], MD       For TEE no changes [name]

## 2021-07-10 NOTE — Transfer of Care (Signed)
Immediate Anesthesia Transfer of Care Note  Patient: [name] [name].  Procedure(s) Performed: TRANSESOPHAGEAL ECHOCARDIOGRAM (TEE)  Patient Location: Endoscopy Unit  Anesthesia Type:MAC  Level of Consciousness: awake, alert  and oriented  Airway & Oxygen Therapy: Patient Spontanous Breathing and Patient connected to nasal cannula oxygen  Post-op Assessment: Report given to RN and Post -op Vital signs reviewed and stable  Post vital signs: Reviewed and stable  Last Vitals:  Vitals Value Taken Time  BP 124/47   Temp    Pulse    Resp 12   SpO2 93     Last Pain:  Vitals:   07/10/21 0859  TempSrc: Oral  PainSc: (P) 0-No pain         Complications: No notable events documented.

## 2021-07-10 NOTE — Interval H&P Note (Signed)
History and Physical Interval Note:  07/10/2021 8:48 AM  [name] [name].  has presented today for surgery, with the diagnosis of AORTIC VALVE DISEASE.  The various methods of treatment have been discussed with the patient and family. After consideration of risks, benefits and other options for treatment, the patient has consented to  Procedure(s): TRANSESOPHAGEAL ECHOCARDIOGRAM (TEE) (N/A) as a surgical intervention.  The patient's history has been reviewed, patient examined, no change in status, stable for surgery.  I have reviewed the patient's chart and labs.  Questions were answered to the patient's satisfaction.     [name]

## 2021-07-10 NOTE — Progress Notes (Signed)
    Transesophageal Echocardiogram Note  [name] [name] [id number] November 01, 1951  Procedure: Transesophageal Echocardiogram Indications: AI  Procedure Details Consent: Obtained Time Out: Verified patient identification, verified procedure, site/side was marked, verified correct patient position, special equipment/implants available, Radiology Safety Procedures followed,  medications/allergies/relevent history reviewed, required imaging and test results available.  Performed  Medications:  Pt sedated by anesthesia with lidocaine 100 mg and diprovan 344 mg IV  Normal LV function; moderate LAE; mild RAE; mild RVE; ASA; severe AI; mild MR and TR.   Complications: No apparent complications Patient did tolerate procedure well.  [name], MD

## 2021-07-10 NOTE — Anesthesia Preprocedure Evaluation (Signed)
Anesthesia Evaluation    Reviewed: Allergy & Precautions, Patient's Chart, lab work & pertinent test results  History of Anesthesia Complications Negative for: history of anesthetic complications  Airway Mallampati: III  TM Distance: >3 FB Neck ROM: Full    Dental  (+) Caps   Pulmonary sleep apnea and Continuous Positive Airway Pressure Ventilation , former smoker,    Pulmonary exam normal        Cardiovascular + CAD, + Past MI (2003) and + Cardiac Stents (2003)  Normal cardiovascular exam+ dysrhythmias Atrial Fibrillation + Valvular Problems/Murmurs AI   IMPRESSIONS    1. Left ventricular ejection fraction, by estimation, is 55 to 60%. The  left ventricle has normal function. The left ventricle has no regional  wall motion abnormalities. The left ventricular internal cavity size was  severely dilated. Left ventricular  diastolic parameters are consistent with Grade I diastolic dysfunction  (impaired relaxation).  2. Right ventricular systolic function is normal. The right ventricular  size is normal. There is normal pulmonary artery systolic pressure.  3. Left atrial size was moderately dilated.  4. Right atrial size was mildly dilated.  5. The mitral valve is normal in structure. Trivial mitral valve  regurgitation. No evidence of mitral stenosis. Severe mitral annular  calcification.  6. The aortic valve is tricuspid. Aortic valve regurgitation is moderate  to severe. Mild aortic valve stenosis.  7. Aortic dilatation noted. There is mild dilatation of the ascending  aorta, measuring 43 mm.  8. The inferior vena cava is dilated in size with >50% respiratory  variability, suggesting right atrial pressure of 8 mmHg.   IMPRESSIONS   1. Left ventricular ejection fraction, by estimation, is 55 to 60%. The left ventricle has normal function. The left ventricle has no regional wall motion abnormalities. The left  ventricular internal cavity size was severely dilated.Left ventricular  diastolic parameters are consistent with Grade I diastolic dysfunction(impaired relaxation). 2. Right ventricular systolic function is normal. The right ventricular sizeis normal. There is normal pulmonary artery systolic pressure. 3. Left atrial size was moderately dilated. 4. Right atrial size was mildly dilated. 5. The mitral valve is normal in structure. Trivial mitral valve regurgitation. No evidence of mitral stenosis. Severe mitral annularcalcification. 6. The aortic valve is tricuspid. Aortic valve regurgitation is moderate tosevere. Mild aortic valve stenosis. 7. Aortic dilatation noted. There is mild dilatation of the ascending aorta,measuring 43 mm. 8. The inferior vena cava is dilated in size with >50% respiratoryvariability, suggesting right atrial pressure of 8 mmHg.   Neuro/Psych negative neurological ROS  negative psych ROS   GI/Hepatic Neg liver ROS, GERD  ,  Endo/Other  diabetesMorbid obesity  Renal/GU negative Renal ROS  negative genitourinary   Musculoskeletal negative musculoskeletal ROS (+)   Abdominal   Peds  Hematology Pradaxa   Anesthesia Other Findings    Reproductive/Obstetrics                             Anesthesia Physical  Anesthesia Plan  ASA: 3  Anesthesia Plan: MAC   Post-op Pain Management:    Induction: Intravenous  PONV Risk Score and Plan: 1 and Propofol infusion, TIVA and Treatment may vary due to age or medical condition  Airway Management Planned: Natural Airway, Nasal Cannula and Simple Face Mask  Additional Equipment: None  Intra-op Plan:   Post-operative Plan:   Informed Consent: I have reviewed the patients History and Physical, chart, labs and discussed the procedure  including the risks, benefits and alternatives for the proposed anesthesia with the patient or authorized representative who has indicated his/her  understanding and acceptance.       Plan Discussed with:   Anesthesia Plan Comments:         Anesthesia Quick Evaluation

## 2021-07-10 NOTE — Progress Notes (Signed)
  Echocardiogram Echocardiogram Transesophageal has been performed.  [name] [name] G Kiet Geer 07/10/2021, 10:17 AM

## 2021-07-10 NOTE — Anesthesia Procedure Notes (Signed)
Procedure Name: MAC Date/Time: 07/10/2021 9:25 AM Performed by: Dorthea Cove, CRNA Pre-anesthesia Checklist: Patient identified, Emergency Drugs available, Suction available, Patient being monitored and Timeout performed Patient Re-evaluated:Patient Re-evaluated prior to induction Oxygen Delivery Method: Nasal cannula Preoxygenation: Pre-oxygenation with 100% oxygen Induction Type: IV induction Placement Confirmation: CO2 detector and positive ETCO2

## 2021-07-11 ENCOUNTER — Encounter: Payer: Self-pay | Admitting: *Deleted

## 2021-07-12 ENCOUNTER — Other Ambulatory Visit: Payer: Self-pay

## 2021-07-12 ENCOUNTER — Encounter: Payer: Self-pay | Admitting: Cardiology

## 2021-07-12 ENCOUNTER — Telehealth: Payer: Self-pay | Admitting: *Deleted

## 2021-07-12 ENCOUNTER — Ambulatory Visit (INDEPENDENT_AMBULATORY_CARE_PROVIDER_SITE_OTHER): Payer: Medicare Other | Admitting: Cardiology

## 2021-07-12 VITALS — BP 152/54 | HR 64 | Ht 73.0 in | Wt 290.0 lb

## 2021-07-12 DIAGNOSIS — I480 Paroxysmal atrial fibrillation: Secondary | ICD-10-CM

## 2021-07-12 DIAGNOSIS — E7800 Pure hypercholesterolemia, unspecified: Secondary | ICD-10-CM

## 2021-07-12 DIAGNOSIS — I351 Nonrheumatic aortic (valve) insufficiency: Secondary | ICD-10-CM | POA: Diagnosis not present

## 2021-07-12 DIAGNOSIS — I2510 Atherosclerotic heart disease of native coronary artery without angina pectoris: Secondary | ICD-10-CM

## 2021-07-12 DIAGNOSIS — I10 Essential (primary) hypertension: Secondary | ICD-10-CM | POA: Diagnosis not present

## 2021-07-12 LAB — BASIC METABOLIC PANEL
BUN/Creatinine Ratio: 20 (ref 10–24)
BUN: 32 mg/dL — ABNORMAL HIGH (ref 8–27)
CO2: 21 mmol/L (ref 20–29)
Calcium: 9.4 mg/dL (ref 8.6–10.2)
Chloride: 104 mmol/L (ref 96–106)
Creatinine, Ser: 1.6 mg/dL — ABNORMAL HIGH (ref 0.76–1.27)
Glucose: 135 mg/dL — ABNORMAL HIGH (ref 65–99)
Potassium: 4.9 mmol/L (ref 3.5–5.2)
Sodium: 139 mmol/L (ref 134–144)
eGFR: 46 mL/min/{1.73_m2} — ABNORMAL LOW (ref >59)

## 2021-07-12 LAB — CBC
Hematocrit: 42.5 % (ref 37.5–51.0)
Hemoglobin: 13.7 g/dL (ref 13.0–17.7)
MCH: 29.1 pg (ref 26.6–33.0)
MCHC: 32.2 g/dL (ref 31.5–35.7)
MCV: 90 fL (ref 79–97)
Platelets: 179 10*3/uL (ref 150–450)
RBC: 4.7 x10E6/uL (ref 4.14–5.80)
RDW: 13.1 % (ref 11.6–15.4)
WBC: 6.1 10*3/uL (ref 3.4–10.8)

## 2021-07-12 MED ORDER — SODIUM CHLORIDE 0.9% FLUSH: 3.0000 mL | Freq: Two times a day (BID) | INTRAVENOUS | Status: AC

## 2021-07-12 NOTE — Telephone Encounter (Signed)
Please review

## 2021-07-12 NOTE — Patient Instructions (Addendum)
  [facility] [place] [address] Dept: [phone] Loc: [phone]  [name] [name].  07/12/2021  You are scheduled for a Cardiac Catheterization on Monday, July 18 with Dr. Larae Grooms.  1. Please arrive at the [facility] (Main Entrance A) at [facility]: [address] [place], [place] [address] at 5:30 AM (This time is two hours before your procedure to ensure your preparation). Free valet parking service is available.   Special note: Every effort is made to have your procedure done on time. Please understand that emergencies sometimes delay scheduled procedures.  2. Diet: Do not eat solid foods after midnight.  The patient may have clear liquids until 5am upon the day of the procedure.  3. Labs: You will need to have blood drawn on today  4. Medication instructions in preparation for your procedure:   Contrast Allergy: No  Stop taking Eliquis (Apixiban) on Friday, July 15.  DO NOT TAKE METFORMIN 7/18, 7/19, 7/20 RESTART 7/21  On the morning of your procedure, take your Aspirin and any morning medicines NOT listed above.  You may use sips of water.  5. Plan for one night stay--bring personal belongings. 6. Bring a current list of your medications and current insurance cards. 7. You MUST have a responsible person to drive you home. 8. Someone MUST be with you the first 24 hours after you arrive home or your discharge will be delayed. 9. Please wear clothes that are easy to get on and off and wear slip-on shoes.  Thank you for allowing [place] to care for you!   -- [facility]   Your physician recommends that you return for lab work 7/21 FOR LAB WORK  Your physician recommends that you schedule a follow-up appointment in: [place]

## 2021-07-12 NOTE — Telephone Encounter (Addendum)
Pt contacted pre-catheterization scheduled at [facility] for: Monday July 16, 2021 10:30 AM Verified arrival time and place: [place] [facility]) at: 5:30-6 AM-pre-procedure hydration   No solid food after midnight prior to cath, clear liquids until 5 AM day of procedure.  Hold: Eliquis-none 07/14/21 until post procedure Metformin-day of procedure and 48 hours post procedure Losartan-day before and day of procedure-(GFR <60)  Except hold medications AM meds can be  taken pre-cath with sips of water including: aspirin 81 mg   Confirmed patient has responsible adult to drive home post procedure and be with patient first 24 hours after arriving home: yes  You are allowed ONE visitor in the waiting room during the time you are at the hospital for your procedure. Both you and your visitor must wear a mask once you enter the hospital.   Patient reports does not currently have any symptoms concerning for COVID-19 and no household members with COVID-19 like illness.        Reviewed procedure/mask/visitor instructions, pre-procedure hydration with patient

## 2021-07-12 NOTE — H&P (View-Only) (Signed)
HPI: FU hx of TAA, AI, CAD s/p stent to LAD in 2003, parox AFib, HL, OSA. He has seen Dr. Rayann Heman and ablation could be considered if he lost weight. Previous rash with cardizem; on tikosyn. Has had previous cardioversions for atrial fibrillation. Carotid Dopplers June 2019 showed 1 to 39% bilateral stenosis. Abdominal ultrasound March 2021 showed no aneurysm. The right and left iliac arteries were dilated (followed by vascular surgery). Echocardiogram May 2022 showed normal LV function, grade 1 diastolic dysfunction, moderate left atrial enlargement, mild right atrial enlargement, moderate to severe aortic insufficiency, mild aortic stenosis, mildly dilated ascending aorta at 43 mm.  Chest CT June 2022 showed 4.4 x 4.4 cm aortic aneurysm, dilated pulmonary artery at 4.6 cm, emphysema with areas of fibrosis.  TEE July 2022 showed normal LV function, moderate left ventricular enlargement, mild right ventricular enlargement, moderate left atrial enlargement, mild right atrial enlargement, mild mitral regurgitation, severe aortic insufficiency and dilated ascending aorta at 43 mm.  Since last seen, he has dyspnea with more vigorous activities.  No orthopnea, PND, pedal edema, chest pain or syncope.  Current Outpatient Medications  Medication Sig Dispense Refill   acetaminophen (TYLENOL) 325 MG tablet Take 650 mg by mouth every 6 (six) hours as needed for mild pain.     albuterol (PROVENTIL HFA;VENTOLIN HFA) 108 (90 Base) MCG/ACT inhaler Inhale 2 puffs into the lungs every 6 (six) hours as needed for wheezing or shortness of breath. 1 Inhaler 0   ALPRAZolam (XANAX) 0.5 MG tablet Take 1 tablet (0.5 mg total) by mouth 2 (two) times daily as needed for anxiety. 30 tablet 0   ANORO ELLIPTA 62.5-25 MCG/INH AEPB INHALE 1 PUFF BY MOUTH EVERY DAY (Patient taking differently: Inhale 1 puff into the lungs daily.) 60 each 5   apixaban (ELIQUIS) 5 MG TABS tablet Take 1 tablet (5 mg total) by mouth 2 (two) times  daily. 180 tablet 3   atorvastatin (LIPITOR) 80 MG tablet TAKE 1 TABLET BY MOUTH  DAILY (Patient taking differently: Take 80 mg by mouth daily.) 90 tablet 3   Blood Glucose Monitoring Suppl (ONETOUCH VERIO) w/Device KIT Use to test blood sugars 1-2 times daily. 1 kit 1   brimonidine (ALPHAGAN) 0.15 % ophthalmic solution Place 1 drop into both eyes 3 (three) times daily.     busPIRone (BUSPAR) 10 MG tablet Take 1 tablet (10 mg total) by mouth 2 (two) times daily. 180 tablet 1   Cholecalciferol (VITAMIN D) 50 MCG (2000 UT) tablet Take 200-4,000 Units by mouth See admin instructions. Take 2000 units by mouth in the morning and 4000 units in the evening     diphenhydrAMINE (BENADRYL) 25 MG tablet Take 25-50 mg by mouth at bedtime as needed for sleep.      dofetilide (TIKOSYN) 500 MCG capsule TAKE 1 CAPSULE BY MOUTH  TWICE DAILY (Patient taking differently: Take 500 mcg by mouth 2 (two) times daily.) 180 capsule 3   famotidine (PEPCID) 10 MG tablet Take 10 mg by mouth as needed for heartburn.      fexofenadine (ALLEGRA) 180 MG tablet Take 180 mg by mouth daily as needed for allergies.      fish oil-omega-3 fatty acids 1000 MG capsule Take 1 g by mouth 2 (two) times daily.     fluticasone (FLONASE) 50 MCG/ACT nasal spray SPRAY 2 SPRAYS IN EACH NOSTRIL DAILY AS NEEDED FOR ALLERGIES (Patient taking differently: Place 2 sprays into both nostrils daily.) 48 mL PRN  hydrALAZINE (APRESOLINE) 100 MG tablet Take 1 tablet (100 mg total) by mouth 3 (three) times daily. 270 tablet 3   KLOR-CON M20 20 MEQ tablet TAKE 1 TABLET BY MOUTH EVERY DAY (Patient taking differently: Take 20 mEq by mouth daily.) 90 tablet 3   Lancets (ONETOUCH ULTRASOFT) lancets Twice daily 100 each 6   losartan (COZAAR) 100 MG tablet Take 1 tablet (100 mg total) by mouth daily. 90 tablet 3   Magnesium 250 MG TABS Take 250-500 mg by mouth See admin instructions. Takes 250 mg by mouth in the morning and 500 mg in the evening     metFORMIN  (GLUCOPHAGE-XR) 500 MG 24 hr tablet TAKE 1 TABLET BY MOUTH IN THE AM, AT NOON AND AT BEDTIME (Patient taking differently: Take 500 mg by mouth in the morning, at noon, and at bedtime.) 270 tablet 3   methocarbamol (ROBAXIN) 500 MG tablet TAKE 1 TABLET BY MOUTH 4 TIMES A DAY AS NEEDED FOR MUSCLE SPASM (Patient taking differently: Take 500 mg by mouth 4 (four) times daily as needed for muscle spasms.) 120 tablet 2   MULTIPLE VITAMIN PO Take 1 tablet by mouth every evening.     nitroGLYCERIN (NITROSTAT) 0.4 MG SL tablet Place 1 tablet (0.4 mg total) under the tongue every 5 (five) minutes as needed for chest pain. 25 tablet 12   ONETOUCH VERIO test strip USE AS DIRECTED TWICE A DAY 50 strip 12   pantoprazole (PROTONIX) 40 MG tablet Take 40 mg by mouth daily as needed (acid reflux).     PARoxetine (PAXIL) 20 MG tablet Take 2.5 tablets (50 mg total) by mouth daily. (Patient taking differently: Take 50 mg by mouth every evening.) 90 tablet 1   tamsulosin (FLOMAX) 0.4 MG CAPS capsule TAKE 1 CAPSULE BY MOUTH EVERY DAY (Patient taking differently: Take 0.4 mg by mouth daily as needed (repeat kidney stone).) 30 capsule 5   traZODone (DESYREL) 50 MG tablet TAKE 1 TABLET BY MOUTH EVERYDAY AT BEDTIME 90 tablet 3   triamcinolone cream (KENALOG) 0.1 % Apply 1 application topically 2 (two) times daily. As needed. (Patient taking differently: Apply 1 application topically 2 (two) times daily as needed (irritation).) 30 g 0   No current facility-administered medications for this visit.     Past Medical History:  Diagnosis Date   Atrial fibrillation (HCC) 03/22/2009   a. s/p multiple DCCV; b. no coumadin due to low TE risk profile; c. Tikosyn Rx   Coronary atherosclerosis of native coronary artery 11/2002   a. s/p stent to LAD 12/03; OM2 occluded at cath 12/03; d. myoview 5/10: no ischemia;  e. echo 7/11: EF 55%, BAE, mild RVE, PASP 41-45; Myoview was in March 2013. There was no ischemia or infarction, EF 51%     Cutaneous abscess of back excluding buttocks 07/04/2014   Appears to stem from possibly a cyst very large area 6 cm contact surgeon office    Diabetes mellitus without complication (HCC)    Drusen body    see opth note   Epiglottitis    w emergency nt intubation   ERECTILE DYSFUNCTION 03/22/2009   GERD 03/22/2009   HYPERGLYCEMIA 04/25/2010   HYPERLIPIDEMIA 03/22/2009   Iliac aneurysm (HCC)    2.6 to be evaluated incidental finding on CT   LATERAL EPICONDYLITIS, LEFT 10/24/2009   LIVER FUNCTION TESTS, ABNORMAL 04/25/2010   Local reaction to immunization 05/05/2012   minor resolving  zostavax    Myocardial infarction (HCC) mi2003   Numbness in   left leg    foot related to back disease and surgery   Obesity, unspecified 04/24/2009   Perforated appendicitis with necrosis s/p open appendectomy 06/07/14 06/04/2014   Renal cyst    Characterized by MRI as simple   Ruptured suppurative appendicitis    2015    SLEEP APNEA, OBSTRUCTIVE 03/22/2009   compliant with CPAP   THROMBOCYTOPENIA 08/16/2010   TOBACCO USE, QUIT 10/24/2009   ULNAR NEUROPATHY, LEFT 03/22/2009   Umbilical hernia     Past Surgical History:  Procedure Laterality Date   APPENDECTOMY N/A 06/06/2014   Procedure: APPENDECTOMY;  Surgeon: Todd J Rosenbower, MD;  Location: [facility];  Service: General;  Laterality: N/A;   COLON SURGERY     COLONOSCOPY  11/15/2011   Procedure: COLONOSCOPY;  Surgeon: Jyothi Mann, MD;  Location: [facility];  Service: Endoscopy;  Laterality: N/A;   COLONOSCOPY WITH PROPOFOL N/A 01/03/2017   Procedure: COLONOSCOPY WITH PROPOFOL;  Surgeon: Patrick Hung, MD;  Location: [facility];  Service: Endoscopy;  Laterality: N/A;   COLONOSCOPY WITH PROPOFOL N/A 05/26/2020   Procedure: COLONOSCOPY WITH PROPOFOL;  Surgeon: Hung, Patrick, MD;  Location: [facility];  Service: Endoscopy;  Laterality: N/A;   cyst on epiglottis  08/2002   ESOPHAGOGASTRODUODENOSCOPY  11/15/2011   Procedure: ESOPHAGOGASTRODUODENOSCOPY (EGD);   Surgeon: Jyothi Mann, MD;  Location: [facility];  Service: Endoscopy;  Laterality: N/A;   LAPAROSCOPIC APPENDECTOMY N/A 06/06/2014   Procedure: APPENDECTOMY LAPAROSCOPIC attemted;  Surgeon: Todd J Rosenbower, MD;  Location: [facility];  Service: General;  Laterality: N/A;   LUMBAR DISC SURGERY     two  holes in spinalcovering   POLYPECTOMY  05/26/2020   Procedure: POLYPECTOMY;  Surgeon: Hung, Patrick, MD;  Location: [facility];  Service: Endoscopy;;   stent     LAD DUS 2004   STERNAL WOUND DEBRIDEMENT Left 08/31/2019   Procedure: LEFT STERNOCLAVICULAR WOUND DEBRIDEMENT WITH APPLICATION OF WOUND VAC;  Surgeon: Gerhardt, Edward B, MD;  Location: [facility];  Service: Thoracic;  Laterality: Left;   surgery l4-l5   1998   ruptured x 3   TEE WITHOUT CARDIOVERSION N/A 07/10/2021   Procedure: TRANSESOPHAGEAL ECHOCARDIOGRAM (TEE);  Surgeon: Toren Tucholski S, MD;  Location: [facility];  Service: Cardiovascular;  Laterality: N/A;   ulnar neuropathy     UMBILICAL HERNIA REPAIR     mesh    Social History   Socioeconomic History   Marital status: Married    Spouse name: Not on file   Number of children: 1   Years of education: 15   Highest education level: Associate degree: occupational, technical, or vocational program  Occupational History   Occupation: retired    Comment: [employer]  Tobacco Use   Smoking status: Former    Packs/day: 2.00    Years: 42.00    Pack years: 84.00    Types: Cigarettes    Quit date: 12/30/2008    Years since quitting: 12.5   Smokeless tobacco: Never   Tobacco comments:    started at age 16; 1-2 ppd; quit 2010  Vaping Use   Vaping Use: Never used  Substance and Sexual Activity   Alcohol use: No    Alcohol/week: 0.0 standard drinks    Comment: rarely; maybe 1 beer a year   Drug use: No   Sexual activity: Yes    Comment: infrequently  Other Topics Concern   Not on file  Social History Narrative   Retired paramedic   Regular exercise-yes not recently  due to   SOB   Has one son   Wife is overweight and has fibromyalgia and depression on disability doesn't go out much. Back surgery    Has older dog   Retired from [employer] 31 years of service now 7 years    Caffeine- minimal   Social Determinants of Radio broadcast assistant Strain: Not on file  Food Insecurity: Not on file  Transportation Needs: Not on file  Physical Activity: Not on file  Stress: Not on file  Social Connections: Not on file  Intimate Partner Violence: Not on file    Family History  Problem Relation Age of Onset   Thyroid disease Mother    Ovarian cancer Mother    Breast cancer Mother    Lung cancer Father    Cancer Father     ROS: no fevers or chills, productive cough, hemoptysis, dysphasia, odynophagia, melena, hematochezia, dysuria, hematuria, rash, seizure activity, orthopnea, PND, pedal edema, claudication. Remaining systems are negative.  Physical Exam: Well-developed well-nourished in no acute distress.  Skin is warm and dry.  HEENT is normal.  Neck is supple.  Chest is clear to auscultation with normal expansion.  Cardiovascular exam is regular rate and rhythm.  Abdominal exam nontender or distended. No masses palpated. Extremities show no edema. neuro grossly intact  ECG-normal sinus rhythm at a rate of 64, first-degree AV block, no ST changes, normal QT interval; QT interval.  Personally reviewed  A/P  1 aortic insufficiency-evaluated further on recent transesophageal echocardiogram and is severe.  Patient is having worsening dyspnea on exertion.  We will arrange right and left cardiac catheterization.  I will then have cardiothoracic surgery evaluate for aortic valve replacement, possible aortic root replacement and coronary artery bypass graft if indicated.  Risk and benefits of cardiac catheterization including myocardial infarction, CVA and death discussed and he agrees to proceed.  Hold apixaban 2 days prior to procedure.  Hold Glucophage for  48 hours following procedure.  2 paroxysmal atrial fibrillation-patient remains in sinus rhythm.  Continue Tikosyn and apixaban.  Would likely benefit from Maze procedure at time of aortic valve replacement.  3 thoracic aortic aneurysm-May require aortic root replacement at time of AVR.  4 history of coronary disease-continue statin.  He is not on aspirin given need for anticoagulation.  5 hyperlipidemia-continue statin.  6 history of iliac aneurysms-followed by vascular surgery.  7 hypertension-patient's blood pressure is borderline.  We will follow and increase medications if needed.  8 history of dilated pulmonary artery suggesting pulmonary hypertension-pulmonary pressures will be evaluated at time of right heart catheterization.  This is likely secondary to a combination of COPD, obstructive sleep apnea and possible contribution from obesity hypoventilation syndrome.  9 chronic stage III kidney disease-we will ask patient to increase p.o. fluid intake prior to catheterization and hydrate in the morning of procedure.  We will need to check potassium and renal function 3 to 4 days following procedure.  Limit dye.  No ventriculogram.  [name], MD

## 2021-07-12 NOTE — Progress Notes (Signed)
HPI: FU hx of TAA, AI, CAD s/p stent to LAD in 2003, parox AFib, HL, OSA. He has seen Dr. Rayann Heman and ablation could be considered if he lost weight. Previous rash with cardizem; on tikosyn. Has had previous cardioversions for atrial fibrillation. Carotid Dopplers June 2019 showed 1 to 39% bilateral stenosis. Abdominal ultrasound March 2021 showed no aneurysm. The right and left iliac arteries were dilated (followed by vascular surgery). Echocardiogram May 2022 showed normal LV function, grade 1 diastolic dysfunction, moderate left atrial enlargement, mild right atrial enlargement, moderate to severe aortic insufficiency, mild aortic stenosis, mildly dilated ascending aorta at 43 mm.  Chest CT June 2022 showed 4.4 x 4.4 cm aortic aneurysm, dilated pulmonary artery at 4.6 cm, emphysema with areas of fibrosis.  TEE July 2022 showed normal LV function, moderate left ventricular enlargement, mild right ventricular enlargement, moderate left atrial enlargement, mild right atrial enlargement, mild mitral regurgitation, severe aortic insufficiency and dilated ascending aorta at 43 mm.  Since last seen, he has dyspnea with more vigorous activities.  No orthopnea, PND, pedal edema, chest pain or syncope.  Current Outpatient Medications  Medication Sig Dispense Refill   acetaminophen (TYLENOL) 325 MG tablet Take 650 mg by mouth every 6 (six) hours as needed for mild pain.     albuterol (PROVENTIL HFA;VENTOLIN HFA) 108 (90 Base) MCG/ACT inhaler Inhale 2 puffs into the lungs every 6 (six) hours as needed for wheezing or shortness of breath. 1 Inhaler 0   ALPRAZolam (XANAX) 0.5 MG tablet Take 1 tablet (0.5 mg total) by mouth 2 (two) times daily as needed for anxiety. 30 tablet 0   ANORO ELLIPTA 62.5-25 MCG/INH AEPB INHALE 1 PUFF BY MOUTH EVERY DAY (Patient taking differently: Inhale 1 puff into the lungs daily.) 60 each 5   apixaban (ELIQUIS) 5 MG TABS tablet Take 1 tablet (5 mg total) by mouth 2 (two) times  daily. 180 tablet 3   atorvastatin (LIPITOR) 80 MG tablet TAKE 1 TABLET BY MOUTH  DAILY (Patient taking differently: Take 80 mg by mouth daily.) 90 tablet 3   Blood Glucose Monitoring Suppl (ONETOUCH VERIO) w/Device KIT Use to test blood sugars 1-2 times daily. 1 kit 1   brimonidine (ALPHAGAN) 0.15 % ophthalmic solution Place 1 drop into both eyes 3 (three) times daily.     busPIRone (BUSPAR) 10 MG tablet Take 1 tablet (10 mg total) by mouth 2 (two) times daily. 180 tablet 1   Cholecalciferol (VITAMIN D) 50 MCG (2000 UT) tablet Take 200-4,000 Units by mouth See admin instructions. Take 2000 units by mouth in the morning and 4000 units in the evening     diphenhydrAMINE (BENADRYL) 25 MG tablet Take 25-50 mg by mouth at bedtime as needed for sleep.      dofetilide (TIKOSYN) 500 MCG capsule TAKE 1 CAPSULE BY MOUTH  TWICE DAILY (Patient taking differently: Take 500 mcg by mouth 2 (two) times daily.) 180 capsule 3   famotidine (PEPCID) 10 MG tablet Take 10 mg by mouth as needed for heartburn.      fexofenadine (ALLEGRA) 180 MG tablet Take 180 mg by mouth daily as needed for allergies.      fish oil-omega-3 fatty acids 1000 MG capsule Take 1 g by mouth 2 (two) times daily.     fluticasone (FLONASE) 50 MCG/ACT nasal spray SPRAY 2 SPRAYS IN EACH NOSTRIL DAILY AS NEEDED FOR ALLERGIES (Patient taking differently: Place 2 sprays into both nostrils daily.) 48 mL PRN  hydrALAZINE (APRESOLINE) 100 MG tablet Take 1 tablet (100 mg total) by mouth 3 (three) times daily. 270 tablet 3   KLOR-CON M20 20 MEQ tablet TAKE 1 TABLET BY MOUTH EVERY DAY (Patient taking differently: Take 20 mEq by mouth daily.) 90 tablet 3   Lancets (ONETOUCH ULTRASOFT) lancets Twice daily 100 each 6   losartan (COZAAR) 100 MG tablet Take 1 tablet (100 mg total) by mouth daily. 90 tablet 3   Magnesium 250 MG TABS Take 250-500 mg by mouth See admin instructions. Takes 250 mg by mouth in the morning and 500 mg in the evening     metFORMIN  (GLUCOPHAGE-XR) 500 MG 24 hr tablet TAKE 1 TABLET BY MOUTH IN THE AM, AT NOON AND AT BEDTIME (Patient taking differently: Take 500 mg by mouth in the morning, at noon, and at bedtime.) 270 tablet 3   methocarbamol (ROBAXIN) 500 MG tablet TAKE 1 TABLET BY MOUTH 4 TIMES A DAY AS NEEDED FOR MUSCLE SPASM (Patient taking differently: Take 500 mg by mouth 4 (four) times daily as needed for muscle spasms.) 120 tablet 2   MULTIPLE VITAMIN PO Take 1 tablet by mouth every evening.     nitroGLYCERIN (NITROSTAT) 0.4 MG SL tablet Place 1 tablet (0.4 mg total) under the tongue every 5 (five) minutes as needed for chest pain. 25 tablet 12   ONETOUCH VERIO test strip USE AS DIRECTED TWICE A DAY 50 strip 12   pantoprazole (PROTONIX) 40 MG tablet Take 40 mg by mouth daily as needed (acid reflux).     PARoxetine (PAXIL) 20 MG tablet Take 2.5 tablets (50 mg total) by mouth daily. (Patient taking differently: Take 50 mg by mouth every evening.) 90 tablet 1   tamsulosin (FLOMAX) 0.4 MG CAPS capsule TAKE 1 CAPSULE BY MOUTH EVERY DAY (Patient taking differently: Take 0.4 mg by mouth daily as needed (repeat kidney stone).) 30 capsule 5   traZODone (DESYREL) 50 MG tablet TAKE 1 TABLET BY MOUTH EVERYDAY AT BEDTIME 90 tablet 3   triamcinolone cream (KENALOG) 0.1 % Apply 1 application topically 2 (two) times daily. As needed. (Patient taking differently: Apply 1 application topically 2 (two) times daily as needed (irritation).) 30 g 0   No current facility-administered medications for this visit.     Past Medical History:  Diagnosis Date   Atrial fibrillation ([place]) 03/22/2009   a. s/p multiple DCCV; b. no coumadin due to low TE risk profile; c. Tikosyn Rx   Coronary atherosclerosis of native coronary artery 11/2002   a. s/p stent to LAD 12/03; OM2 occluded at cath 12/03; d. myoview 5/10: no ischemia;  e. echo 7/11: EF 55%, BAE, mild RVE, PASP 41-45; Myoview was in March 2013. 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[place])    2.6 to be evaluated incidental finding on CT   LATERAL EPICONDYLITIS, LEFT 10/24/2009   LIVER FUNCTION TESTS, ABNORMAL 04/25/2010   Local reaction to immunization 05/05/2012   minor resolving  zostavax    Myocardial infarction ([facility]) mi2003   Numbness in  left leg    foot related to back disease and surgery   Obesity, unspecified 04/24/2009   Perforated appendicitis with necrosis s/p open appendectomy 06/07/14 06/04/2014   Renal cyst    Characterized by MRI as simple   Ruptured suppurative appendicitis    2015    SLEEP APNEA, OBSTRUCTIVE 03/22/2009   compliant with CPAP   THROMBOCYTOPENIA 08/16/2010   TOBACCO USE, QUIT 10/24/2009   ULNAR NEUROPATHY, LEFT 3/84/5364   Umbilical hernia     Past Surgical History:  Procedure Laterality Date   APPENDECTOMY N/A 06/06/2014   Procedure: APPENDECTOMY;  Surgeon: Odis Hollingshead, MD;  Location: [facility];  Service: General;  Laterality: N/A;   COLON SURGERY     COLONOSCOPY  11/15/2011   Procedure: COLONOSCOPY;  Surgeon: Juanita Craver, MD;  Location: [facility];  Service: Endoscopy;  Laterality: N/A;   COLONOSCOPY WITH PROPOFOL N/A 01/03/2017   Procedure: COLONOSCOPY WITH PROPOFOL;  Surgeon: Carol Ada, MD;  Location: [facility];  Service: Endoscopy;  Laterality: N/A;   COLONOSCOPY WITH PROPOFOL N/A 05/26/2020   Procedure: COLONOSCOPY WITH PROPOFOL;  Surgeon: Carol Ada, MD;  Location: [facility];  Service: Endoscopy;  Laterality: N/A;   cyst on epiglottis  08/2002   ESOPHAGOGASTRODUODENOSCOPY  11/15/2011   Procedure: ESOPHAGOGASTRODUODENOSCOPY (EGD);   Surgeon: Juanita Craver, MD;  Location: [facility];  Service: Endoscopy;  Laterality: N/A;   LAPAROSCOPIC APPENDECTOMY N/A 06/06/2014   Procedure: APPENDECTOMY LAPAROSCOPIC attemted;  Surgeon: Odis Hollingshead, MD;  Location: [facility];  Service: General;  Laterality: N/A;   LUMBAR DISC SURGERY     two  holes in spinalcovering   POLYPECTOMY  05/26/2020   Procedure: POLYPECTOMY;  Surgeon: Carol Ada, MD;  Location: [facility];  Service: Endoscopy;;   stent     LAD DUS 2004   STERNAL WOUND DEBRIDEMENT Left 08/31/2019   Procedure: LEFT STERNOCLAVICULAR WOUND DEBRIDEMENT WITH APPLICATION OF WOUND VAC;  Surgeon: Grace Isaac, MD;  Location: [facility];  Service: Thoracic;  Laterality: Left;   surgery l4-l5   1998   ruptured x 3   TEE WITHOUT CARDIOVERSION N/A 07/10/2021   Procedure: TRANSESOPHAGEAL ECHOCARDIOGRAM (TEE);  Surgeon: Lelon Perla, MD;  Location: [facility];  Service: Cardiovascular;  Laterality: N/A;   ulnar neuropathy     UMBILICAL HERNIA REPAIR     mesh    Social History   Socioeconomic History   Marital status: Married    Spouse name: Not on file   Number of children: 1   Years of education: 15   Highest education level: Associate degree: occupational, Hotel manager, or vocational program  Occupational History   Occupation: retired    Comment: [employer]  Tobacco Use   Smoking status: Former    Packs/day: 2.00    Years: 42.00    Pack years: 84.00    Types: Cigarettes    Quit date: 12/30/2008    Years since quitting: 12.5   Smokeless tobacco: Never   Tobacco comments:    started at age 54; 1-2 ppd; quit 2010  Vaping Use   Vaping Use: Never used  Substance and Sexual Activity   Alcohol use: No    Alcohol/week: 0.0 standard drinks    Comment: rarely; maybe 1 beer a year   Drug use: No   Sexual activity: Yes    Comment: infrequently  Other Topics Concern   Not on file  Social History Narrative   Retired paramedic   Regular exercise-yes not recently  due to  SOB   Has one son   Wife is overweight and has fibromyalgia and depression on disability doesn't go out much. Back surgery    Has older dog   Retired from [employer] 31 years of service now 7 years    Caffeine- minimal   Social Determinants of Radio broadcast assistant Strain: Not on file  Food Insecurity: Not on file  Transportation Needs: Not on file  Physical Activity: Not on file  Stress: Not on file  Social Connections: Not on file  Intimate Partner Violence: Not on file    Family History  Problem Relation Age of Onset   Thyroid disease Mother    Ovarian cancer Mother    Breast cancer Mother    Lung cancer Father    Cancer Father     ROS: no fevers or chills, productive cough, hemoptysis, dysphasia, odynophagia, melena, hematochezia, dysuria, hematuria, rash, seizure activity, orthopnea, PND, pedal edema, claudication. Remaining systems are negative.  Physical Exam: Well-developed well-nourished in no acute distress.  Skin is warm and dry.  HEENT is normal.  Neck is supple.  Chest is clear to auscultation with normal expansion.  Cardiovascular exam is regular rate and rhythm.  Abdominal exam nontender or distended. No masses palpated. Extremities show no edema. neuro grossly intact  ECG-normal sinus rhythm at a rate of 64, first-degree AV block, no ST changes, normal QT interval; QT interval.  Personally reviewed  A/P  1 aortic insufficiency-evaluated further on recent transesophageal echocardiogram and is severe.  Patient is having worsening dyspnea on exertion.  We will arrange right and left cardiac catheterization.  I will then have cardiothoracic surgery evaluate for aortic valve replacement, possible aortic root replacement and coronary artery bypass graft if indicated.  Risk and benefits of cardiac catheterization including myocardial infarction, CVA and death discussed and he agrees to proceed.  Hold apixaban 2 days prior to procedure.  Hold Glucophage for  48 hours following procedure.  2 paroxysmal atrial fibrillation-patient remains in sinus rhythm.  Continue Tikosyn and apixaban.  Would likely benefit from Maze procedure at time of aortic valve replacement.  3 thoracic aortic aneurysm-May require aortic root replacement at time of AVR.  4 history of coronary disease-continue statin.  He is not on aspirin given need for anticoagulation.  5 hyperlipidemia-continue statin.  6 history of iliac aneurysms-followed by vascular surgery.  7 hypertension-patient's blood pressure is borderline.  We will follow and increase medications if needed.  8 history of dilated pulmonary artery suggesting pulmonary hypertension-pulmonary pressures will be evaluated at time of right heart catheterization.  This is likely secondary to a combination of COPD, obstructive sleep apnea and possible contribution from obesity hypoventilation syndrome.  9 chronic stage III kidney disease-we will ask patient to increase p.o. fluid intake prior to catheterization and hydrate in the morning of procedure.  We will need to check potassium and renal function 3 to 4 days following procedure.  Limit dye.  No ventriculogram.  [name], MD

## 2021-07-13 ENCOUNTER — Other Ambulatory Visit (INDEPENDENT_AMBULATORY_CARE_PROVIDER_SITE_OTHER): Payer: Medicare Other

## 2021-07-13 DIAGNOSIS — E785 Hyperlipidemia, unspecified: Secondary | ICD-10-CM

## 2021-07-13 DIAGNOSIS — F4310 Post-traumatic stress disorder, unspecified: Secondary | ICD-10-CM

## 2021-07-13 DIAGNOSIS — E1159 Type 2 diabetes mellitus with other circulatory complications: Secondary | ICD-10-CM

## 2021-07-13 DIAGNOSIS — Z79899 Other long term (current) drug therapy: Secondary | ICD-10-CM

## 2021-07-13 DIAGNOSIS — F4322 Adjustment disorder with anxiety: Secondary | ICD-10-CM

## 2021-07-13 DIAGNOSIS — I10 Essential (primary) hypertension: Secondary | ICD-10-CM

## 2021-07-13 DIAGNOSIS — Z7901 Long term (current) use of anticoagulants: Secondary | ICD-10-CM

## 2021-07-13 LAB — CBC WITH DIFFERENTIAL/PLATELET
Basophils Absolute: 0 10*3/uL (ref 0.0–0.1)
Basophils Relative: 0.6 % (ref 0.0–3.0)
Eosinophils Absolute: 0.4 10*3/uL (ref 0.0–0.7)
Eosinophils Relative: 7.2 % — ABNORMAL HIGH (ref 0.0–5.0)
HCT: 39.7 % (ref 39.0–52.0)
Hemoglobin: 13.6 g/dL (ref 13.0–17.0)
Lymphocytes Relative: 25.1 % (ref 12.0–46.0)
Lymphs Abs: 1.6 10*3/uL (ref 0.7–4.0)
MCHC: 34.2 g/dL (ref 30.0–36.0)
MCV: 89.4 fl (ref 78.0–100.0)
Monocytes Absolute: 0.6 10*3/uL (ref 0.1–1.0)
Monocytes Relative: 9.5 % (ref 3.0–12.0)
Neutro Abs: 3.6 10*3/uL (ref 1.4–7.7)
Neutrophils Relative %: 57.6 % (ref 43.0–77.0)
Platelets: 161 10*3/uL (ref 150.0–400.0)
RBC: 4.44 Mil/uL (ref 4.22–5.81)
RDW: 14.9 % (ref 11.5–15.5)
WBC: 6.2 10*3/uL (ref 4.0–10.5)

## 2021-07-13 LAB — BASIC METABOLIC PANEL
BUN: 37 mg/dL — ABNORMAL HIGH (ref 6–23)
CO2: 25 mEq/L (ref 19–32)
Calcium: 9.5 mg/dL (ref 8.4–10.5)
Chloride: 108 mEq/L (ref 96–112)
Creatinine, Ser: 1.58 mg/dL — ABNORMAL HIGH (ref 0.40–1.50)
GFR: 43.68 mL/min — ABNORMAL LOW (ref >60.00)
Glucose, Bld: 121 mg/dL — ABNORMAL HIGH (ref 70–99)
Potassium: 5.1 mEq/L (ref 3.5–5.1)
Sodium: 139 mEq/L (ref 135–145)

## 2021-07-13 LAB — HEPATIC FUNCTION PANEL
ALT: 43 U/L (ref 0–53)
AST: 29 U/L (ref 0–37)
Albumin: 4.1 g/dL (ref 3.5–5.2)
Alkaline Phosphatase: 48 U/L (ref 39–117)
Bilirubin, Direct: 0.2 mg/dL (ref 0.0–0.3)
Total Bilirubin: 0.7 mg/dL (ref 0.2–1.2)
Total Protein: 6.5 g/dL (ref 6.0–8.3)

## 2021-07-13 LAB — LIPID PANEL
Cholesterol: 134 mg/dL (ref 0–200)
HDL: 45.5 mg/dL (ref >39.00)
LDL Cholesterol: 68 mg/dL (ref 0–99)
NonHDL: 88.8
Total CHOL/HDL Ratio: 3
Triglycerides: 103 mg/dL (ref 0.0–149.0)
VLDL: 20.6 mg/dL (ref 0.0–40.0)

## 2021-07-13 LAB — HEMOGLOBIN A1C: Hgb A1c MFr Bld: 6.7 % — ABNORMAL HIGH (ref 4.6–6.5)

## 2021-07-13 NOTE — Progress Notes (Signed)
Stable  will review at  upcoming visit

## 2021-07-14 ENCOUNTER — Other Ambulatory Visit: Payer: Self-pay | Admitting: Internal Medicine

## 2021-07-16 ENCOUNTER — Other Ambulatory Visit: Payer: Self-pay

## 2021-07-16 ENCOUNTER — Ambulatory Visit (HOSPITAL_COMMUNITY)
Admission: RE | Admit: 2021-07-16 | Discharge: 2021-07-16 | Disposition: A | Discharge status: Home / Self Care | Payer: Medicare Other | Attending: Interventional Cardiology | Admitting: Interventional Cardiology

## 2021-07-16 ENCOUNTER — Encounter (HOSPITAL_COMMUNITY)
Admission: RE | Disposition: A | Discharge status: Home / Self Care | Payer: Self-pay | Source: Home / Self Care | Attending: Interventional Cardiology

## 2021-07-16 ENCOUNTER — Encounter (HOSPITAL_COMMUNITY): Payer: Self-pay | Admitting: Interventional Cardiology

## 2021-07-16 DIAGNOSIS — Z955 Presence of coronary angioplasty implant and graft: Secondary | ICD-10-CM | POA: Insufficient documentation

## 2021-07-16 DIAGNOSIS — I351 Nonrheumatic aortic (valve) insufficiency: Secondary | ICD-10-CM

## 2021-07-16 DIAGNOSIS — I10 Essential (primary) hypertension: Secondary | ICD-10-CM

## 2021-07-16 DIAGNOSIS — E1122 Type 2 diabetes mellitus with diabetic chronic kidney disease: Secondary | ICD-10-CM | POA: Insufficient documentation

## 2021-07-16 DIAGNOSIS — I2510 Atherosclerotic heart disease of native coronary artery without angina pectoris: Secondary | ICD-10-CM

## 2021-07-16 DIAGNOSIS — Z87891 Personal history of nicotine dependence: Secondary | ICD-10-CM | POA: Insufficient documentation

## 2021-07-16 DIAGNOSIS — I480 Paroxysmal atrial fibrillation: Secondary | ICD-10-CM

## 2021-07-16 DIAGNOSIS — I272 Pulmonary hypertension, unspecified: Secondary | ICD-10-CM | POA: Insufficient documentation

## 2021-07-16 DIAGNOSIS — Z7984 Long term (current) use of oral hypoglycemic drugs: Secondary | ICD-10-CM | POA: Insufficient documentation

## 2021-07-16 DIAGNOSIS — G4733 Obstructive sleep apnea (adult) (pediatric): Secondary | ICD-10-CM | POA: Insufficient documentation

## 2021-07-16 DIAGNOSIS — Z6838 Body mass index (BMI) 38.0-38.9, adult: Secondary | ICD-10-CM | POA: Diagnosis not present

## 2021-07-16 DIAGNOSIS — J439 Emphysema, unspecified: Secondary | ICD-10-CM | POA: Diagnosis not present

## 2021-07-16 DIAGNOSIS — I252 Old myocardial infarction: Secondary | ICD-10-CM | POA: Diagnosis not present

## 2021-07-16 DIAGNOSIS — I712 Thoracic aortic aneurysm, without rupture: Secondary | ICD-10-CM | POA: Diagnosis not present

## 2021-07-16 DIAGNOSIS — E669 Obesity, unspecified: Secondary | ICD-10-CM | POA: Diagnosis not present

## 2021-07-16 DIAGNOSIS — I1310 Hypertensive heart and chronic kidney disease without heart failure, with stage 1 through stage 4 chronic kidney disease, or unspecified chronic kidney disease: Secondary | ICD-10-CM | POA: Diagnosis not present

## 2021-07-16 DIAGNOSIS — Z7901 Long term (current) use of anticoagulants: Secondary | ICD-10-CM | POA: Diagnosis not present

## 2021-07-16 DIAGNOSIS — E785 Hyperlipidemia, unspecified: Secondary | ICD-10-CM | POA: Diagnosis not present

## 2021-07-16 DIAGNOSIS — E7800 Pure hypercholesterolemia, unspecified: Secondary | ICD-10-CM

## 2021-07-16 DIAGNOSIS — Z79899 Other long term (current) drug therapy: Secondary | ICD-10-CM | POA: Insufficient documentation

## 2021-07-16 DIAGNOSIS — N183 Chronic kidney disease, stage 3 unspecified: Secondary | ICD-10-CM | POA: Insufficient documentation

## 2021-07-16 HISTORY — PX: RIGHT/LEFT HEART CATH AND CORONARY ANGIOGRAPHY: CATH118266

## 2021-07-16 LAB — POCT I-STAT 7, (LYTES, BLD GAS, ICA,H+H)
Acid-base deficit: 2 mmol/L (ref 0.0–2.0)
Acid-base deficit: 4 mmol/L — ABNORMAL HIGH (ref 0.0–2.0)
Bicarbonate: 22.5 mmol/L (ref 20.0–28.0)
Bicarbonate: 24.2 mmol/L (ref 20.0–28.0)
Calcium, Ion: 1.35 mmol/L (ref 1.15–1.40)
Calcium, Ion: 1.37 mmol/L (ref 1.15–1.40)
HCT: 38 % — ABNORMAL LOW (ref 39.0–52.0)
HCT: 38 % — ABNORMAL LOW (ref 39.0–52.0)
Hemoglobin: 12.9 g/dL — ABNORMAL LOW (ref 13.0–17.0)
Hemoglobin: 12.9 g/dL — ABNORMAL LOW (ref 13.0–17.0)
O2 Saturation: 73 %
O2 Saturation: 91 %
Potassium: 4.7 mmol/L (ref 3.5–5.1)
Potassium: 4.8 mmol/L (ref 3.5–5.1)
Sodium: 141 mmol/L (ref 135–145)
Sodium: 142 mmol/L (ref 135–145)
TCO2: 24 mmol/L (ref 22–32)
TCO2: 26 mmol/L (ref 22–32)
pCO2 arterial: 43.5 mmHg (ref 32.0–48.0)
pCO2 arterial: 48.2 mmHg — ABNORMAL HIGH (ref 32.0–48.0)
pH, Arterial: 7.308 — ABNORMAL LOW (ref 7.350–7.450)
pH, Arterial: 7.321 — ABNORMAL LOW (ref 7.350–7.450)
pO2, Arterial: 42 mmHg — ABNORMAL LOW (ref 83.0–108.0)
pO2, Arterial: 66 mmHg — ABNORMAL LOW (ref 83.0–108.0)

## 2021-07-16 LAB — GLUCOSE, CAPILLARY
Glucose-Capillary: 136 mg/dL — ABNORMAL HIGH (ref 70–99)
Glucose-Capillary: 124 mg/dL — ABNORMAL HIGH (ref 70–99)

## 2021-07-16 SURGERY — RIGHT/LEFT HEART CATH AND CORONARY ANGIOGRAPHY
Anesthesia: LOCAL

## 2021-07-16 MED ORDER — ACETAMINOPHEN 325 MG PO TABS: 650.0000 mg | ORAL_TABLET | ORAL | Status: DC | PRN

## 2021-07-16 MED ORDER — LIDOCAINE HCL (PF) 1 % IJ SOLN
INTRAMUSCULAR | Status: AC
  Filled 2021-07-16: qty 30

## 2021-07-16 MED ORDER — FENTANYL CITRATE (PF) 100 MCG/2ML IJ SOLN
INTRAMUSCULAR | Status: AC
  Filled 2021-07-16: qty 2

## 2021-07-16 MED ORDER — ATORVASTATIN CALCIUM 80 MG PO TABS: 80.0000 mg | ORAL_TABLET | Freq: Every day | ORAL | Status: AC

## 2021-07-16 MED ORDER — HEPARIN SODIUM (PORCINE) 1000 UNIT/ML IJ SOLN
INTRAMUSCULAR | Status: DC | PRN
  Administered 2021-07-16: 6500 [IU] via INTRAVENOUS

## 2021-07-16 MED ORDER — DOFETILIDE 500 MCG PO CAPS: 500.0000 ug | ORAL_CAPSULE | Freq: Two times a day (BID) | ORAL | Status: AC

## 2021-07-16 MED ORDER — TAMSULOSIN HCL 0.4 MG PO CAPS: 0.4000 mg | ORAL_CAPSULE | Freq: Every day | ORAL | Status: AC | PRN

## 2021-07-16 MED ORDER — HEPARIN SODIUM (PORCINE) 1000 UNIT/ML IJ SOLN
INTRAMUSCULAR | Status: AC
  Filled 2021-07-16: qty 1

## 2021-07-16 MED ORDER — SODIUM CHLORIDE 0.9% FLUSH: 3.0000 mL | INTRAVENOUS | Status: DC | PRN

## 2021-07-16 MED ORDER — ONDANSETRON HCL 4 MG/2ML IJ SOLN: 4.0000 mg | Freq: Four times a day (QID) | INTRAMUSCULAR | Status: DC | PRN

## 2021-07-16 MED ORDER — LABETALOL HCL 5 MG/ML IV SOLN: 10.0000 mg | INTRAVENOUS | Status: DC | PRN

## 2021-07-16 MED ORDER — ANORO ELLIPTA 62.5-25 MCG/INH IN AEPB: 1.0000 | INHALATION_SPRAY | Freq: Every day | RESPIRATORY_TRACT | Status: AC

## 2021-07-16 MED ORDER — SODIUM CHLORIDE 0.9 % IV SOLN: 250.0000 mL | INTRAVENOUS | Status: DC | PRN

## 2021-07-16 MED ORDER — VERAPAMIL HCL 2.5 MG/ML IV SOLN
INTRAVENOUS | Status: AC
  Filled 2021-07-16: qty 2

## 2021-07-16 MED ORDER — POTASSIUM CHLORIDE CRYS ER 20 MEQ PO TBCR: 20.0000 meq | EXTENDED_RELEASE_TABLET | Freq: Every day | ORAL | Status: DC

## 2021-07-16 MED ORDER — VERAPAMIL HCL 2.5 MG/ML IV SOLN
INTRAVENOUS | Status: DC | PRN
  Administered 2021-07-16: 10 mL via INTRA_ARTERIAL

## 2021-07-16 MED ORDER — SODIUM CHLORIDE 0.9 % WEIGHT BASED INFUSION
1.0000 mL/kg/h | INTRAVENOUS | Status: DC
  Administered 2021-07-16: 1 mL/kg/h via INTRAVENOUS

## 2021-07-16 MED ORDER — MIDAZOLAM HCL 2 MG/2ML IJ SOLN
INTRAMUSCULAR | Status: DC | PRN
  Administered 2021-07-16: 1 mg via INTRAVENOUS

## 2021-07-16 MED ORDER — HYDRALAZINE HCL 20 MG/ML IJ SOLN: 10.0000 mg | INTRAMUSCULAR | Status: DC | PRN

## 2021-07-16 MED ORDER — FENTANYL CITRATE (PF) 100 MCG/2ML IJ SOLN
INTRAMUSCULAR | Status: DC | PRN
  Administered 2021-07-16: 25 ug via INTRAVENOUS

## 2021-07-16 MED ORDER — IOHEXOL 350 MG/ML SOLN
INTRAVENOUS | Status: DC | PRN
  Administered 2021-07-16: 75 mL via INTRA_ARTERIAL

## 2021-07-16 MED ORDER — MIDAZOLAM HCL 2 MG/2ML IJ SOLN
INTRAMUSCULAR | Status: AC
  Filled 2021-07-16: qty 2

## 2021-07-16 MED ORDER — METFORMIN HCL ER 500 MG PO TB24: 500.0000 mg | ORAL_TABLET | Freq: Three times a day (TID) | ORAL | Status: AC

## 2021-07-16 MED ORDER — HEPARIN (PORCINE) IN NACL 1000-0.9 UT/500ML-% IV SOLN
INTRAVENOUS | Status: AC
  Filled 2021-07-16: qty 1000

## 2021-07-16 MED ORDER — FLUTICASONE PROPIONATE 50 MCG/ACT NA SUSP: 2.0000 | Freq: Every day | NASAL | Status: AC

## 2021-07-16 MED ORDER — SODIUM CHLORIDE 0.9 % IV SOLN: INTRAVENOUS | Status: DC

## 2021-07-16 MED ORDER — TRIAMCINOLONE ACETONIDE 0.1 % EX CREA: 1.0000 "application " | TOPICAL_CREAM | Freq: Two times a day (BID) | CUTANEOUS | Status: AC | PRN

## 2021-07-16 MED ORDER — SODIUM CHLORIDE 0.9 % WEIGHT BASED INFUSION
3.0000 mL/kg/h | INTRAVENOUS | Status: AC
  Administered 2021-07-16: 3 mL/kg/h via INTRAVENOUS

## 2021-07-16 MED ORDER — HEPARIN (PORCINE) IN NACL 1000-0.9 UT/500ML-% IV SOLN
INTRAVENOUS | Status: DC | PRN
  Administered 2021-07-16 (×2): 500 mL

## 2021-07-16 MED ORDER — LIDOCAINE HCL (PF) 1 % IJ SOLN
INTRAMUSCULAR | Status: DC | PRN
  Administered 2021-07-16 (×2): 2 mL

## 2021-07-16 MED ORDER — ASPIRIN 81 MG PO CHEW
81.0000 mg | CHEWABLE_TABLET | ORAL | Status: AC
  Administered 2021-07-16: 81 mg via ORAL
  Filled 2021-07-16: qty 1

## 2021-07-16 MED ORDER — APIXABAN 5 MG PO TABS: 5.0000 mg | ORAL_TABLET | Freq: Two times a day (BID) | ORAL | 3 refills | Status: DC

## 2021-07-16 MED ORDER — SODIUM CHLORIDE 0.9% FLUSH: 3.0000 mL | Freq: Two times a day (BID) | INTRAVENOUS | Status: DC

## 2021-07-16 SURGICAL SUPPLY — 14 items
CATH 5FR JL3.5 JR4 ANG PIG MP (CATHETERS) ×2 IMPLANT
CATH BALLN WEDGE 5F 110CM (CATHETERS) ×2 IMPLANT
CATH INFINITI 5FR AL1 (CATHETERS) ×2 IMPLANT
CATH INFINITI 5FR JL4 (CATHETERS) ×2 IMPLANT
DEVICE RAD COMP TR BAND LRG (VASCULAR PRODUCTS) ×2 IMPLANT
GLIDESHEATH SLEND SS 6F .021 (SHEATH) ×2 IMPLANT
GUIDEWIRE INQWIRE 1.5J.035X260 (WIRE) ×1 IMPLANT
INQWIRE 1.5J .035X260CM (WIRE) ×2
KIT HEART LEFT (KITS) ×2 IMPLANT
PACK CARDIAC CATHETERIZATION (CUSTOM PROCEDURE TRAY) ×2 IMPLANT
SHEATH GLIDE SLENDER 4/5FR (SHEATH) ×2 IMPLANT
SYR MEDRAD MARK 7 150ML (SYRINGE) ×2 IMPLANT
TRANSDUCER W/STOPCOCK (MISCELLANEOUS) ×2 IMPLANT
TUBING CIL FLEX 10 FLL-RA (TUBING) ×2 IMPLANT

## 2021-07-16 NOTE — Interval H&P Note (Signed)
History and Physical Interval Note:  07/16/2021 10:03 AM  [name] [name].  has presented today for surgery, with the diagnosis of aortic stenosis.  The various methods of treatment have been discussed with the patient and family. After consideration of risks, benefits and other options for treatment, the patient has consented to  Procedure(s): RIGHT/LEFT HEART CATH AND CORONARY ANGIOGRAPHY (N/A) as a surgical intervention.  The patient's history has been reviewed, patient examined, no change in status, stable for surgery.  I have reviewed the patient's chart and labs.  Questions were answered to the patient's satisfaction.    Plan for diagnostic cath only pre valve replacement.   Larae Grooms

## 2021-07-16 NOTE — Progress Notes (Signed)
O2 sat [place] notified and O2 started at 2l/min

## 2021-07-17 ENCOUNTER — Other Ambulatory Visit: Payer: Medicare Other

## 2021-07-20 LAB — BASIC METABOLIC PANEL
BUN/Creatinine Ratio: 23 (ref 10–24)
BUN: 30 mg/dL — ABNORMAL HIGH (ref 8–27)
CO2: 19 mmol/L — ABNORMAL LOW (ref 20–29)
Calcium: 9.3 mg/dL (ref 8.6–10.2)
Chloride: 103 mmol/L (ref 96–106)
Creatinine, Ser: 1.33 mg/dL — ABNORMAL HIGH (ref 0.76–1.27)
Glucose: 157 mg/dL — ABNORMAL HIGH (ref 65–99)
Potassium: 4.7 mmol/L (ref 3.5–5.2)
Sodium: 138 mmol/L (ref 134–144)
eGFR: 57 mL/min/{1.73_m2} — ABNORMAL LOW (ref >59)

## 2021-07-23 ENCOUNTER — Other Ambulatory Visit: Payer: Self-pay

## 2021-07-23 NOTE — Progress Notes (Signed)
Chief Complaint  Patient presents with   Annual Exam   Follow-up     HPI: [name] [name]. 70 y.o. comes in today for Preventive Medicare exam/ wellness visit .Since last visit.  He has been officially diagnosed with significant AI aortic root dilatation as part of a cause of the shortness of breath. He has had a TEE and cardiac catheterization that showed moderate pulmonary hypertension.  He is on ongoing inhaler for COPD.   To see dr [name]  for vlavle replace   AV He saw behavioral health who added the alprazolam to be used as needed has a follow-up.  Medication check.  He is on 50 mg of Paxil.  Still thinks he has anxiety but some of his shortness of breath is from medical issues. Dm  had labs done in control  No sig gi sx  change  Health Maintenance  Topic Date Due   Zoster Vaccines- Shingrix (2 of 2) 09/29/2020   COVID-19 Vaccine (4 - Booster for Pfizer series) 01/28/2021   FOOT EXAM  07/21/2021   INFLUENZA VACCINE  07/30/2021   OPHTHALMOLOGY EXAM  01/04/2022   HEMOGLOBIN A1C  01/13/2022   TETANUS/TDAP  04/05/2025   COLONOSCOPY (Pts 45-28yr Insurance coverage will need to be confirmed)  05/26/2030   Hepatitis C Screening  Completed   PNA vac Low Risk Adult  Completed   HPV VACCINES  Aged Out   Health Maintenance Review LIFESTYLE:  Exercise:  limimted by dyspnea Tobacco/ETS:n Alcohol:  Sugar beverages:n Sleep:sleeping more upright  cpap  Drug use: no HH:2   Hearing: decrease  Vision:  check   this week  . 20/40 and 20 25    Safety:  Has smoke detector and wears seat belts.   No excess sun exposure. Sees dentist regularly Memory: Felt to be OK . Depression: No anhedonia unusual crying or depressive symptoms but ansxiety see gad scale Nutrition: Eats well balanced diet; adequate calcium and vitamin D. No swallowing chewing problems. Other healthcare providers:  Reviewed today .  Preventive parameters: up-to-date  Reviewed   dsic getting covid 4 immuniz   ADLS:   There are no problems or need for assistance  driving, feeding, obtaining food, dressing, toileting and bathing, managing money using phone. s independent.   ROS:  As  per hpi  REST of 12 system review negative except as per HPI   Past Medical History:  Diagnosis Date   Atrial fibrillation ([facility] 03/22/2009   a. s/p multiple DCCV; b. no coumadin due to low TE risk profile; c. Tikosyn Rx   Coronary atherosclerosis of native coronary artery 11/2002   a. s/p stent to LAD 12/03; OM2 occluded at cath 12/03; d. myoview 5/10: no ischemia;  e. echo 7/11: EF 55%, BAE, mild RVE, PASP 41-45; Myoview was in March 2013. There was no ischemia or infarction, EF 51%    Cutaneous abscess of back excluding buttocks 07/04/2014   Appears to stem from possibly a cyst very large area 6 cm contact surgeon office    Diabetes mellitus without complication ([facility]    Drusen body    see opth note   Epiglottitis    w emergency nt intubation   ERECTILE DYSFUNCTION 03/22/2009   GERD 03/22/2009   HYPERGLYCEMIA 04/25/2010   HYPERLIPIDEMIA 03/22/2009   Iliac aneurysm ([facility]    2.6 to be evaluated incidental finding on CT   LATERAL EPICONDYLITIS, LEFT 10/24/2009   LIVER FUNCTION TESTS, ABNORMAL 04/25/2010   Local reaction to  immunization 05/05/2012   minor resolving  zostavax    Myocardial infarction [facility]) mi2003   Numbness in left leg    foot related to back disease and surgery   Obesity, unspecified 04/24/2009   Perforated appendicitis with necrosis s/p open appendectomy 06/07/14 06/04/2014   Renal cyst    Characterized by MRI as simple   Ruptured suppurative appendicitis    2015    SLEEP APNEA, OBSTRUCTIVE 03/22/2009   compliant with CPAP   THROMBOCYTOPENIA 08/16/2010   TOBACCO USE, QUIT 10/24/2009   ULNAR NEUROPATHY, LEFT 0/96/2836   Umbilical hernia     Family History  Problem Relation Age of Onset   Thyroid disease Mother    Ovarian cancer Mother    Breast cancer Mother    Lung cancer Father    Cancer  Father     Social History   Socioeconomic History   Marital status: Married    Spouse name: Not on file   Number of children: 1   Years of education: 15   Highest education level: Associate degree: occupational, Hotel manager, or vocational program  Occupational History   Occupation: retired    Comment: [employer]  Tobacco Use   Smoking status: Former    Packs/day: 2.00    Years: 42.00    Pack years: 84.00    Types: Cigarettes    Quit date: 12/30/2008    Years since quitting: 12.5   Smokeless tobacco: Never   Tobacco comments:    started at age 48; 1-2 ppd; quit 2010  Vaping Use   Vaping Use: Never used  Substance and Sexual Activity   Alcohol use: No    Alcohol/week: 0.0 standard drinks    Comment: rarely; maybe 1 beer a year   Drug use: No   Sexual activity: Yes    Comment: infrequently  Other Topics Concern   Not on file  Social History Narrative   Retired paramedic   Regular exercise-yes not recently due to SOB   Has one son   Wife is overweight and has fibromyalgia and depression on disability doesn't go out much. Back surgery    Has older dog   Retired from [employer] 31 years of service now 7 years    Caffeine- minimal   Social Determinants of Health   Financial Resource Strain: Not on file  Food Insecurity: Not on file  Transportation Needs: Not on file  Physical Activity: Not on file  Stress: Not on file  Social Connections: Not on file    Outpatient Encounter Medications as of 07/24/2021  Medication Sig   acetaminophen (TYLENOL) 325 MG tablet Take 650 mg by mouth every 6 (six) hours as needed for mild pain.   albuterol (PROVENTIL HFA;VENTOLIN HFA) 108 (90 Base) MCG/ACT inhaler Inhale 2 puffs into the lungs every 6 (six) hours as needed for wheezing or shortness of breath.   ALPRAZolam (XANAX) 0.5 MG tablet Take 1 tablet (0.5 mg total) by mouth 2 (two) times daily as needed for anxiety.   apixaban (ELIQUIS) 5 MG TABS tablet Take 1 tablet (5 mg total)  by mouth 2 (two) times daily.   atorvastatin (LIPITOR) 80 MG tablet Take 1 tablet (80 mg total) by mouth daily.   Blood Glucose Monitoring Suppl (ONETOUCH VERIO) w/Device KIT Use to test blood sugars 1-2 times daily.   brimonidine (ALPHAGAN) 0.15 % ophthalmic solution Place 1 drop into both eyes 3 (three) times daily.   busPIRone (BUSPAR) 10 MG tablet Take 1 tablet (10 mg  total) by mouth 2 (two) times daily.   busPIRone (BUSPAR) 15 MG tablet TAKE 1/3 TABLET TWICE A DAY FOR 1 WEEK, THEN 2/3 TAB TWICE DAILY X1 WEEK, THEN 1 TAB TWICE A DAY   Cholecalciferol (VITAMIN D) 50 MCG (2000 UT) tablet Take 200-4,000 Units by mouth See admin instructions. Take 2000 units by mouth in the morning and 4000 units in the evening   diphenhydrAMINE (BENADRYL) 25 MG tablet Take 25-50 mg by mouth at bedtime as needed for sleep.    dofetilide (TIKOSYN) 500 MCG capsule Take 1 capsule (500 mcg total) by mouth 2 (two) times daily.   famotidine (PEPCID) 10 MG tablet Take 10 mg by mouth as needed for heartburn.    fexofenadine (ALLEGRA) 180 MG tablet Take 180 mg by mouth daily as needed for allergies.    fish oil-omega-3 fatty acids 1000 MG capsule Take 1 g by mouth 2 (two) times daily.   fluticasone (FLONASE) 50 MCG/ACT nasal spray Place 2 sprays into both nostrils daily.   hydrALAZINE (APRESOLINE) 100 MG tablet Take 1 tablet (100 mg total) by mouth 3 (three) times daily.   Lancets (ONETOUCH ULTRASOFT) lancets Twice daily   losartan (COZAAR) 100 MG tablet Take 1 tablet (100 mg total) by mouth daily.   Magnesium 250 MG TABS Take 250-500 mg by mouth See admin instructions. Takes 250 mg by mouth in the morning and 500 mg in the evening   metFORMIN (GLUCOPHAGE-XR) 500 MG 24 hr tablet Take 1 tablet (500 mg total) by mouth in the morning, at noon, and at bedtime.   methocarbamol (ROBAXIN) 500 MG tablet TAKE 1 TABLET BY MOUTH 4 TIMES A DAY AS NEEDED FOR MUSCLE SPASM (Patient taking differently: Take 500 mg by mouth 4 (four) times  daily as needed for muscle spasms.)   MULTIPLE VITAMIN PO Take 1 tablet by mouth every evening.   nitroGLYCERIN (NITROSTAT) 0.4 MG SL tablet Place 1 tablet (0.4 mg total) under the tongue every 5 (five) minutes as needed for chest pain.   ONETOUCH VERIO test strip USE AS DIRECTED TWICE A DAY   pantoprazole (PROTONIX) 40 MG tablet Take 40 mg by mouth daily as needed (acid reflux).   PARoxetine (PAXIL) 20 MG tablet TAKE 2&1/2 TABLETS BY MOUTH DAILY   potassium chloride SA (KLOR-CON M20) 20 MEQ tablet Take 1 tablet (20 mEq total) by mouth daily.   tamsulosin (FLOMAX) 0.4 MG CAPS capsule Take 1 capsule (0.4 mg total) by mouth daily as needed (repeat kidney stone).   traZODone (DESYREL) 50 MG tablet TAKE 1 TABLET BY MOUTH EVERYDAY AT BEDTIME   triamcinolone cream (KENALOG) 0.1 % Apply 1 application topically 2 (two) times daily as needed (irritation).   umeclidinium-vilanterol (ANORO ELLIPTA) 62.5-25 MCG/INH AEPB Inhale 1 puff into the lungs daily.   Facility-Administered Encounter Medications as of 07/24/2021  Medication   sodium chloride flush (NS) 0.9 % injection 3 mL    EXAM:  BP 122/62 (BP Location: Left Arm, Patient Position: Sitting, Cuff Size: Large)   Pulse 85   Temp 98.6 F (37 C) (Oral)   Ht 6' 1"  (1.854 m)   Wt 299 lb 12.8 oz (136 kg)   SpO2 (!) 87%   BMI 39.55 kg/m  Pulse ox on repeat at rest 91% pulse 58 Body mass index is 39.55 kg/m.  Physical Exam: Vital signs reviewed HLK:TGYB is a well-developed well-nourished alert cooperative   who appears stated age in no acute distress.  Some dyspnea  pulse ox 91  after resting  HEENT: normocephalic atraumatic , Eyes: PERRL EOM's full, conjunctiva clear, Nares: paten,t no deformity discharge or tenderness., Ears: no deformity EAC's clear TMs with normal landmarks. Mouth: masked  NECK: supple without masses, thyromegaly or bruits. CHEST/PULM:  Clear to auscultation and percussion breath sounds equal no wheeze , rales or rhonchi. No  chest wall deformities or tenderness. CV: PMI is nondisplaced, S1 S2 2/6 murmur heart best upright no rubs. Peripheral pulses are full No JVD .  ABDOMEN: Bowel sounds normal nontender  No guard or rebound, no hepato splenomegal no CVA tenderness.   Healed scars  Extremtities:  No clubbing cyanosis or edema, no acute joint swelling or redness no focal atrophy has VV no acute findings simply early clubbing toes NEURO:  Oriented x3, cranial nerves 3-12 appear to be intact, no obvious focal weakness,gait within normal limits SKIN: No acute rashes normal turgor, color, no bruising or petechiae. PSYCH: Oriented, good eye contact, no obvious depression anxiety, cognition and judgment appear normal. LN: no cervical axillary adenopathy No noted deficits in memory, attention, and speech. Feet no ulcers lesion   Lab Results  Component Value Date   WBC 6.2 07/13/2021   HGB 12.9 (L) 07/16/2021   HCT 38.0 (L) 07/16/2021   PLT 161.0 07/13/2021   GLUCOSE 157 (H) 07/19/2021   CHOL 134 07/13/2021   TRIG 103.0 07/13/2021   HDL 45.50 07/13/2021   LDLCALC 68 07/13/2021   ALT 43 07/13/2021   AST 29 07/13/2021   NA 138 07/19/2021   K 4.7 07/19/2021   CL 103 07/19/2021   CREATININE 1.33 (H) 07/19/2021   BUN 30 (H) 07/19/2021   CO2 19 (L) 07/19/2021   TSH 1.414 08/27/2019   PSA 1.28 09/14/2020   INR 1.2 08/31/2019   HGBA1C 6.7 (H) 07/13/2021   MICROALBUR 18.7 (H) 02/08/2019    ASSESSMENT AND PLAN:  Discussed the following assessment and plan:  Visit for preventive health examination  Medication management  Dyspnea, unspecified type - Multifactorial pulm ht copd A! and anxiety  Type 2 diabetes mellitus with other circulatory complications ([place])  Pure hypercholesterolemia  Hypertension, unspecified type  PTSD (post-traumatic stress disorder) ?  Chronic anticoagulation  Obstructive sleep apnea Multiple medical problems many in control most recently his dyspnea shortness of breath he felt  was from anxiety but has underlying COPD pulmonary hypertension and moderate to severe AI.  No new orthopedic problems at this time. Dyspnea at this point is limiting. He is seeing a behavioral health prescriber  added alprazolam to the mix that helps him some does not take it every day Advise follow-up before surgery  ,  if need be we can help with prescriptions if appropriate. Lab reviewed renal function today is improved from previous creatinine 1.33 estimated GFR 57  Patient Care Team: Savi Lastinger, Standley Brooking, MD as PCP - General Stanford Breed, Denice Bors, MD as PCP - Cardiology (Cardiology) Juanita Craver, MD (Gastroenterology) Stanford Breed Denice Bors, MD (Cardiology) Jackolyn Confer, MD as Consulting Physician (General Surgery) Georgeann Oppenheim, MD as Consulting Physician (Ophthalmology) Chesley Mires, MD as Consulting Physician (Pulmonary Disease) Angelia Mould, MD as Consulting Physician (Vascular Surgery) Eli Hose, [facility] (Inactive) as Pharmacist (Pharmacist) Grace Isaac, MD (Inactive) as Consulting Physician (Cardiothoracic Surgery)  Patient Instructions  Good to see  you today .   Touch base with  behavioral health .   Get covid booster before surgery . ( Omicron is  very   prevalent in the community and may help you  some from infection short term)   Plan fu     in 3-6 months depending on needs and how doing.   O2 level was 91 at rest.    Mariann Laster K. Makara Lanzo M.D.

## 2021-07-24 ENCOUNTER — Encounter: Payer: Self-pay | Admitting: Internal Medicine

## 2021-07-24 ENCOUNTER — Ambulatory Visit (INDEPENDENT_AMBULATORY_CARE_PROVIDER_SITE_OTHER): Payer: Medicare Other | Admitting: Internal Medicine

## 2021-07-24 VITALS — BP 122/62 | HR 85 | Temp 98.6°F | Ht 73.0 in | Wt 299.8 lb

## 2021-07-24 DIAGNOSIS — Z79899 Other long term (current) drug therapy: Secondary | ICD-10-CM

## 2021-07-24 DIAGNOSIS — F4310 Post-traumatic stress disorder, unspecified: Secondary | ICD-10-CM

## 2021-07-24 DIAGNOSIS — Z0000 Encounter for general adult medical examination without abnormal findings: Secondary | ICD-10-CM | POA: Diagnosis not present

## 2021-07-24 DIAGNOSIS — E1159 Type 2 diabetes mellitus with other circulatory complications: Secondary | ICD-10-CM | POA: Diagnosis not present

## 2021-07-24 DIAGNOSIS — R0600 Dyspnea, unspecified: Secondary | ICD-10-CM | POA: Diagnosis not present

## 2021-07-24 DIAGNOSIS — Z7901 Long term (current) use of anticoagulants: Secondary | ICD-10-CM

## 2021-07-24 DIAGNOSIS — G4733 Obstructive sleep apnea (adult) (pediatric): Secondary | ICD-10-CM | POA: Diagnosis not present

## 2021-07-24 DIAGNOSIS — E7800 Pure hypercholesterolemia, unspecified: Secondary | ICD-10-CM

## 2021-07-24 DIAGNOSIS — I10 Essential (primary) hypertension: Secondary | ICD-10-CM

## 2021-07-24 NOTE — Patient Instructions (Signed)
Good to see  you today .   Touch base with  behavioral health .   Get covid booster before surgery . ( Omicron is  very   prevalent in the community and may help you some from infection short term)   Plan fu     in 3-6 months depending on needs and how doing.   O2 level was 91 at rest.

## 2021-07-25 ENCOUNTER — Ambulatory Visit: Payer: Medicare Other

## 2021-07-25 ENCOUNTER — Ambulatory Visit: Payer: Medicare Other | Attending: Internal Medicine

## 2021-07-25 ENCOUNTER — Other Ambulatory Visit: Payer: Self-pay | Admitting: Behavioral Health

## 2021-07-25 ENCOUNTER — Other Ambulatory Visit: Payer: Self-pay

## 2021-07-25 DIAGNOSIS — Z23 Encounter for immunization: Secondary | ICD-10-CM

## 2021-07-25 DIAGNOSIS — F411 Generalized anxiety disorder: Secondary | ICD-10-CM

## 2021-07-26 ENCOUNTER — Other Ambulatory Visit (HOSPITAL_BASED_OUTPATIENT_CLINIC_OR_DEPARTMENT_OTHER): Payer: Self-pay

## 2021-07-26 MED ORDER — PFIZER-BIONT COVID-19 VAC-TRIS 30 MCG/0.3ML IM SUSP
INTRAMUSCULAR | 0 refills | Status: AC
  Filled 2021-07-26: qty 0.3, 1d supply, fill #0

## 2021-07-26 NOTE — Progress Notes (Signed)
   [id number]  Name:  [name] [name].    MRN: [id number] DOB: July 06, 1951  07/26/2021  Mr. [name] was observed post Covid-19 immunization for 15 minutes without incident. He was provided with Vaccine Information Sheet and instruction to access the V-Safe system.   Mr. [name] was instructed to call 911 with any severe reactions post vaccine: Difficulty breathing  Swelling of face and throat  A fast heartbeat  A bad rash all over body  Dizziness and weakness   Immunizations Administered     Name Date Dose VIS Date Route   PFIZER Comrnaty(Gray TOP) Covid-19 Vaccine 07/25/2021  3:15 PM 0.3 mL 12/07/2020 Intramuscular   Manufacturer: [employer]   Lot: [id number]   [place]: [phone]

## 2021-07-27 ENCOUNTER — Other Ambulatory Visit: Payer: Self-pay | Admitting: Internal Medicine

## 2021-07-31 ENCOUNTER — Telehealth: Payer: Self-pay

## 2021-07-31 NOTE — Telephone Encounter (Signed)
ATC patient to get him scheduled for an appointment with Dr. Halford Chessman or APP, [facility]   When patient calls back please schedule him for an appointment in [facility] with either APP or Dr. Halford Chessman.

## 2021-07-31 NOTE — Telephone Encounter (Signed)
-----   Message from Chesley Mires, MD sent at 07/30/2021  8:25 AM EDT ----- Can you please schedule ROV for Mr. Morrisette to follow up on COPD and low oxygen levels.  Thanks.  V   ----- Message ----- From: Burnis Medin, MD Sent: 07/24/2021   6:28 PM EDT To: Lelon Perla, MD, Chesley Mires, MD  FYI  desaturates  in [phone]  he says  on exertion   91 at rest.

## 2021-08-01 ENCOUNTER — Ambulatory Visit (INDEPENDENT_AMBULATORY_CARE_PROVIDER_SITE_OTHER): Payer: Medicare Other

## 2021-08-01 DIAGNOSIS — Z0000 Encounter for general adult medical examination without abnormal findings: Secondary | ICD-10-CM | POA: Diagnosis not present

## 2021-08-01 NOTE — Patient Instructions (Signed)
[name] [name] , Thank you for taking time to come for your Medicare Wellness Visit. I appreciate your ongoing commitment to your health goals. Please review the following plan we discussed and let me know if I can assist you in the future.   Screening recommendations/referrals: Colonoscopy: 05/26/2020 Recommended yearly ophthalmology/optometry visit for glaucoma screening and checkup Recommended yearly dental visit for hygiene and checkup  Vaccinations: Influenza vaccine: due in fall 2022  Pneumococcal vaccine: completed series  Tdap vaccine: 04/06/2015 Shingles vaccine: completed series     Advanced directives: will provide copies   Conditions/risks identified: none   Next appointment: none   Preventive Care 9 Years and Older, Male Preventive care refers to lifestyle choices and visits with your health care provider that can promote health and wellness. What does preventive care include? A yearly physical exam. This is also called an annual well check. Dental exams once or twice a year. Routine eye exams. Ask your health care provider how often you should have your eyes checked. Personal lifestyle choices, including: Daily care of your teeth and gums. Regular physical activity. Eating a healthy diet. Avoiding tobacco and drug use. Limiting alcohol use. Practicing safe sex. Taking low doses of aspirin every day. Taking vitamin and mineral supplements as recommended by your health care provider. What happens during an annual well check? The services and screenings done by your health care provider during your annual well check will depend on your age, overall health, lifestyle risk factors, and family history of disease. Counseling  Your health care provider may ask you questions about your: Alcohol use. Tobacco use. Drug use. Emotional well-being. Home and relationship well-being. Sexual activity. Eating habits. History of falls. Memory and ability to understand  (cognition). Work and work Statistician. Screening  You may have the following tests or measurements: Height, weight, and BMI. Blood pressure. Lipid and cholesterol levels. These may be checked every 5 years, or more frequently if you are over 51 years old. Skin check. Lung cancer screening. You may have this screening every year starting at age 62 if you have a 30-pack-year history of smoking and currently smoke or have quit within the past 15 years. Fecal occult blood test (FOBT) of the stool. You may have this test every year starting at age 13. Flexible sigmoidoscopy or colonoscopy. You may have a sigmoidoscopy every 5 years or a colonoscopy every 10 years starting at age 45. Prostate cancer screening. Recommendations will vary depending on your family history and other risks. Hepatitis C blood test. Hepatitis B blood test. Sexually transmitted disease (STD) testing. Diabetes screening. This is done by checking your blood sugar (glucose) after you have not eaten for a while (fasting). You may have this done every 1-3 years. Abdominal aortic aneurysm (AAA) screening. You may need this if you are a current or former smoker. Osteoporosis. You may be screened starting at age 74 if you are at high risk. Talk with your health care provider about your test results, treatment options, and if necessary, the need for more tests. Vaccines  Your health care provider may recommend certain vaccines, such as: Influenza vaccine. This is recommended every year. Tetanus, diphtheria, and acellular pertussis (Tdap, Td) vaccine. You may need a Td booster every 10 years. Zoster vaccine. You may need this after age 24. Pneumococcal 13-valent conjugate (PCV13) vaccine. One dose is recommended after age 62. Pneumococcal polysaccharide (PPSV23) vaccine. One dose is recommended after age 73. Talk to your health care provider about which screenings and vaccines  you need and how often you need them. This  information is not intended to replace advice given to you by your health care provider. Make sure you discuss any questions you have with your health care provider. Document Released: 01/12/2016 Document Revised: 09/04/2016 Document Reviewed: 10/17/2015 Elsevier Interactive Patient Education  2017 [place] Prevention in the Home Falls can cause injuries. They can happen to people of all ages. There are many things you can do to make your home safe and to help prevent falls. What can I do on the outside of my home? Regularly fix the edges of walkways and driveways and fix any cracks. Remove anything that might make you trip as you walk through a door, such as a raised step or threshold. Trim any bushes or trees on the path to your home. Use bright outdoor lighting. Clear any walking paths of anything that might make someone trip, such as rocks or tools. Regularly check to see if handrails are loose or broken. Make sure that both sides of any steps have handrails. Any raised decks and porches should have guardrails on the edges. Have any leaves, snow, or ice cleared regularly. Use sand or salt on walking paths during winter. Clean up any spills in your garage right away. This includes oil or grease spills. What can I do in the bathroom? Use night lights. Install grab bars by the toilet and in the tub and shower. Do not use towel bars as grab bars. Use non-skid mats or decals in the tub or shower. If you need to sit down in the shower, use a plastic, non-slip stool. Keep the floor dry. Clean up any water that spills on the floor as soon as it happens. Remove soap buildup in the tub or shower regularly. Attach bath mats securely with double-sided non-slip rug tape. Do not have throw rugs and other things on the floor that can make you trip. What can I do in the bedroom? Use night lights. Make sure that you have a light by your bed that is easy to reach. Do not use any sheets or  blankets that are too big for your bed. They should not hang down onto the floor. Have a firm chair that has side arms. You can use this for support while you get dressed. Do not have throw rugs and other things on the floor that can make you trip. What can I do in the kitchen? Clean up any spills right away. Avoid walking on wet floors. Keep items that you use a lot in easy-to-reach places. If you need to reach something above you, use a strong step stool that has a grab bar. Keep electrical cords out of the way. Do not use floor polish or wax that makes floors slippery. If you must use wax, use non-skid floor wax. Do not have throw rugs and other things on the floor that can make you trip. What can I do with my stairs? Do not leave any items on the stairs. Make sure that there are handrails on both sides of the stairs and use them. Fix handrails that are broken or loose. Make sure that handrails are as long as the stairways. Check any carpeting to make sure that it is firmly attached to the stairs. Fix any carpet that is loose or worn. Avoid having throw rugs at the top or bottom of the stairs. If you do have throw rugs, attach them to the floor with carpet tape. Make sure  that you have a light switch at the top of the stairs and the bottom of the stairs. If you do not have them, ask someone to add them for you. What else can I do to help prevent falls? Wear shoes that: Do not have high heels. Have rubber bottoms. Are comfortable and fit you well. Are closed at the toe. Do not wear sandals. If you use a stepladder: Make sure that it is fully opened. Do not climb a closed stepladder. Make sure that both sides of the stepladder are locked into place. Ask someone to hold it for you, if possible. Clearly mark and make sure that you can see: Any grab bars or handrails. First and last steps. Where the edge of each step is. Use tools that help you move around (mobility aids) if they are  needed. These include: Canes. Walkers. Scooters. Crutches. Turn on the lights when you go into a dark area. Replace any light bulbs as soon as they burn out. Set up your furniture so you have a clear path. Avoid moving your furniture around. If any of your floors are uneven, fix them. If there are any pets around you, be aware of where they are. Review your medicines with your doctor. Some medicines can make you feel dizzy. This can increase your chance of falling. Ask your doctor what other things that you can do to help prevent falls. This information is not intended to replace advice given to you by your health care provider. Make sure you discuss any questions you have with your health care provider. Document Released: 10/12/2009 Document Revised: 05/23/2016 Document Reviewed: 01/20/2015 Elsevier Interactive Patient Education  2017 [employer].

## 2021-08-01 NOTE — Progress Notes (Signed)
Subjective:   [name] [name]. is a 70 y.o. male who presents for an Initial Medicare Annual Wellness Visit.  I connected with [name] [name] today by telephone and verified that I am speaking with the correct person using two identifiers. Location patient: home Location provider: work Persons participating in the virtual visit: patient, provider.   I discussed the limitations, risks, security and privacy concerns of performing an evaluation and management service by telephone and the availability of in person appointments. I also discussed with the patient that there may be a patient responsible charge related to this service. The patient expressed understanding and verbally consented to this telephonic visit.    Interactive audio and video telecommunications were attempted between this provider and patient, however failed, due to patient having technical difficulties OR patient did not have access to video capability.  We continued and completed visit with audio only.    Review of Systems    N/a       Objective:    There were no vitals filed for this visit. There is no height or weight on file to calculate BMI.  Advanced Directives 07/16/2021 07/10/2021 05/26/2020 09/15/2019 08/27/2019 08/19/2019 09/25/2018  Does Patient Have a Medical Advance Directive? No No Yes Yes Yes No Yes  Type of Advance Directive - Public librarian;Living will Living will Living will - [place];Living will  Does patient want to make changes to medical advance directive? - - - No - Guardian declined No - Patient declined - -  Copy of [place] in Chart? - - No - copy requested - - - No - copy requested  Would patient like information on creating a medical advance directive? No - Patient declined No - Patient declined - No - Patient declined No - Patient declined No - Patient declined -  Pre-existing out of facility DNR order (yellow form or pink MOST form) - - -  - - - -    Current Medications (verified) Outpatient Encounter Medications as of 08/01/2021  Medication Sig   acetaminophen (TYLENOL) 325 MG tablet Take 650 mg by mouth every 6 (six) hours as needed for mild pain.   albuterol (PROVENTIL HFA;VENTOLIN HFA) 108 (90 Base) MCG/ACT inhaler Inhale 2 puffs into the lungs every 6 (six) hours as needed for wheezing or shortness of breath.   ALPRAZolam (XANAX) 0.5 MG tablet Take 1 tablet (0.5 mg total) by mouth 2 (two) times daily as needed for anxiety.   apixaban (ELIQUIS) 5 MG TABS tablet Take 1 tablet (5 mg total) by mouth 2 (two) times daily.   atorvastatin (LIPITOR) 80 MG tablet Take 1 tablet (80 mg total) by mouth daily.   Blood Glucose Monitoring Suppl (ONETOUCH VERIO) w/Device KIT Use to test blood sugars 1-2 times daily.   brimonidine (ALPHAGAN) 0.15 % ophthalmic solution Place 1 drop into both eyes 3 (three) times daily.   busPIRone (BUSPAR) 10 MG tablet Take 1 tablet (10 mg total) by mouth 2 (two) times daily.   busPIRone (BUSPAR) 15 MG tablet TAKE 1/3 TABLET TWICE A DAY FOR 1 WEEK, THEN 2/3 TAB TWICE DAILY X1 WEEK, THEN 1 TAB TWICE A DAY   Cholecalciferol (VITAMIN D) 50 MCG (2000 UT) tablet Take 200-4,000 Units by mouth See admin instructions. Take 2000 units by mouth in the morning and 4000 units in the evening   COVID-19 mRNA Vac-TriS, Pfizer, (PFIZER-BIONT COVID-19 VAC-TRIS) SUSP injection Inject into the muscle.   diphenhydrAMINE (BENADRYL)  25 MG tablet Take 25-50 mg by mouth at bedtime as needed for sleep.    dofetilide (TIKOSYN) 500 MCG capsule Take 1 capsule (500 mcg total) by mouth 2 (two) times daily.   famotidine (PEPCID) 10 MG tablet Take 10 mg by mouth as needed for heartburn.    fexofenadine (ALLEGRA) 180 MG tablet Take 180 mg by mouth daily as needed for allergies.    fish oil-omega-3 fatty acids 1000 MG capsule Take 1 g by mouth 2 (two) times daily.   fluticasone (FLONASE) 50 MCG/ACT nasal spray Place 2 sprays into both nostrils  daily.   hydrALAZINE (APRESOLINE) 100 MG tablet Take 1 tablet (100 mg total) by mouth 3 (three) times daily.   Lancets (ONETOUCH ULTRASOFT) lancets Twice daily   losartan (COZAAR) 100 MG tablet Take 1 tablet (100 mg total) by mouth daily.   Magnesium 250 MG TABS Take 250-500 mg by mouth See admin instructions. Takes 250 mg by mouth in the morning and 500 mg in the evening   metFORMIN (GLUCOPHAGE-XR) 500 MG 24 hr tablet Take 1 tablet (500 mg total) by mouth in the morning, at noon, and at bedtime.   methocarbamol (ROBAXIN) 500 MG tablet TAKE 1 TABLET BY MOUTH 4 TIMES A DAY AS NEEDED FOR MUSCLE SPASM (Patient taking differently: Take 500 mg by mouth 4 (four) times daily as needed for muscle spasms.)   MULTIPLE VITAMIN PO Take 1 tablet by mouth every evening.   nitroGLYCERIN (NITROSTAT) 0.4 MG SL tablet Place 1 tablet (0.4 mg total) under the tongue every 5 (five) minutes as needed for chest pain.   ONETOUCH VERIO test strip USE AS DIRECTED TWICE A DAY   pantoprazole (PROTONIX) 40 MG tablet Take 40 mg by mouth daily as needed (acid reflux).   PARoxetine (PAXIL) 20 MG tablet TAKE 2&1/2 TABLETS BY MOUTH DAILY   potassium chloride SA (KLOR-CON M20) 20 MEQ tablet Take 1 tablet (20 mEq total) by mouth daily.   tamsulosin (FLOMAX) 0.4 MG CAPS capsule Take 1 capsule (0.4 mg total) by mouth daily as needed (repeat kidney stone).   traZODone (DESYREL) 50 MG tablet TAKE 1 TABLET BY MOUTH EVERYDAY AT BEDTIME   triamcinolone cream (KENALOG) 0.1 % Apply 1 application topically 2 (two) times daily as needed (irritation).   umeclidinium-vilanterol (ANORO ELLIPTA) 62.5-25 MCG/INH AEPB Inhale 1 puff into the lungs daily.   Facility-Administered Encounter Medications as of 08/01/2021  Medication   sodium chloride flush (NS) 0.9 % injection 3 mL    Allergies (verified) Pseudoephedrine, Diltiazem, Novocain [procaine], Quinolones, and Sulfonamide derivatives   History: Past Medical History:  Diagnosis Date    Atrial fibrillation ([place]) 03/22/2009   a. s/p multiple DCCV; b. no coumadin due to low TE risk profile; c. Tikosyn Rx   Coronary atherosclerosis of native coronary artery 11/2002   a. s/p stent to LAD 12/03; OM2 occluded at cath 12/03; d. myoview 5/10: no ischemia;  e. echo 7/11: EF 55%, BAE, mild RVE, PASP 41-45; Myoview was in March 2013. 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[facility])    2.6 to be evaluated incidental finding on CT   LATERAL EPICONDYLITIS, LEFT 10/24/2009   LIVER FUNCTION TESTS, ABNORMAL 04/25/2010   Local reaction to immunization 05/05/2012   minor resolving  zostavax    Myocardial infarction [facility]) mi2003   Numbness in left leg    foot related to back disease and surgery   Obesity, unspecified 04/24/2009   Perforated appendicitis with necrosis s/p open appendectomy 06/07/14 06/04/2014   Renal cyst    Characterized by MRI as simple   Ruptured suppurative appendicitis    2015    SLEEP APNEA, OBSTRUCTIVE 03/22/2009   compliant with CPAP   THROMBOCYTOPENIA 08/16/2010   TOBACCO USE, QUIT 10/24/2009   ULNAR NEUROPATHY, LEFT 03/25/7123   Umbilical hernia    Past Surgical History:  Procedure Laterality Date   APPENDECTOMY N/A 06/06/2014   Procedure: APPENDECTOMY;  Surgeon: Odis Hollingshead, MD;  Location: [facility];  Service: General;  Laterality: N/A;   COLON SURGERY     COLONOSCOPY  11/15/2011   Procedure: COLONOSCOPY;  Surgeon: Juanita Craver, MD;  Location: [facility];  Service: Endoscopy;  Laterality: N/A;   COLONOSCOPY WITH PROPOFOL N/A 01/03/2017   Procedure: COLONOSCOPY WITH PROPOFOL;   Surgeon: Carol Ada, MD;  Location: [facility];  Service: Endoscopy;  Laterality: N/A;   COLONOSCOPY WITH PROPOFOL N/A 05/26/2020   Procedure: COLONOSCOPY WITH PROPOFOL;  Surgeon: Carol Ada, MD;  Location: [facility];  Service: Endoscopy;  Laterality: N/A;   cyst on epiglottis  08/2002   ESOPHAGOGASTRODUODENOSCOPY  11/15/2011   Procedure: ESOPHAGOGASTRODUODENOSCOPY (EGD);  Surgeon: Juanita Craver, MD;  Location: [facility];  Service: Endoscopy;  Laterality: N/A;   LAPAROSCOPIC APPENDECTOMY N/A 06/06/2014   Procedure: APPENDECTOMY LAPAROSCOPIC attemted;  Surgeon: Odis Hollingshead, MD;  Location: [facility];  Service: General;  Laterality: N/A;   LUMBAR DISC SURGERY     two  holes in spinalcovering   POLYPECTOMY  05/26/2020   Procedure: POLYPECTOMY;  Surgeon: Carol Ada, MD;  Location: [facility];  Service: Endoscopy;;   RIGHT/LEFT HEART CATH AND CORONARY ANGIOGRAPHY N/A 07/16/2021   Procedure: RIGHT/LEFT HEART CATH AND CORONARY ANGIOGRAPHY;  Surgeon: Jettie Booze, MD;  Location: [facility];  Service: Cardiovascular;  Laterality: N/A;   stent     LAD DUS 2004   STERNAL WOUND DEBRIDEMENT Left 08/31/2019   Procedure: LEFT STERNOCLAVICULAR WOUND DEBRIDEMENT WITH APPLICATION OF WOUND VAC;  Surgeon: Grace Isaac, MD;  Location: [facility];  Service: Thoracic;  Laterality: Left;   surgery l4-l5   1998   ruptured x 3   TEE WITHOUT CARDIOVERSION N/A 07/10/2021   Procedure: TRANSESOPHAGEAL ECHOCARDIOGRAM (TEE);  Surgeon: Lelon Perla, MD;  Location: [facility];  Service: Cardiovascular;  Laterality: N/A;   ulnar neuropathy     UMBILICAL HERNIA REPAIR     mesh   Family History  Problem Relation Age of Onset   Thyroid disease Mother    Ovarian cancer Mother    Breast cancer Mother    Lung cancer Father    Cancer Father    Social History   Socioeconomic History   Marital status: Married    Spouse name: Not on file   Number of children: 1   Years of education: 15    Highest education level: Associate degree: occupational, Hotel manager, or vocational program  Occupational History   Occupation: retired    Comment: [employer]  Tobacco Use   Smoking status: Former    Packs/day: 2.00  Years: 42.00    Pack years: 84.00    Types: Cigarettes    Quit date: 12/30/2008    Years since quitting: 12.5   Smokeless tobacco: Never   Tobacco comments:    started at age 45; 1-2 ppd; quit 2010  Vaping Use   Vaping Use: Never used  Substance and Sexual Activity   Alcohol use: No    Alcohol/week: 0.0 standard drinks    Comment: rarely; maybe 1 beer a year   Drug use: No   Sexual activity: Yes    Comment: infrequently  Other Topics Concern   Not on file  Social History Narrative   Retired paramedic   Regular exercise-yes not recently due to SOB   Has one son   Wife is overweight and has fibromyalgia and depression on disability doesn't go out much. Back surgery    Has older dog   Retired from [employer] 31 years of service now 7 years    Caffeine- minimal   Social Determinants of Radio broadcast assistant Strain: Not on file  Food Insecurity: Not on file  Transportation Needs: Not on file  Physical Activity: Not on file  Stress: Not on file  Social Connections: Not on file    Tobacco Counseling Counseling given: Not Answered Tobacco comments: started at age 63; 1-2 ppd; quit 2010   Clinical Intake:                 Diabetic?yes Nutrition Risk Assessment:  Has the patient had any N/V/D within the last 2 months?  No  Does the patient have any non-healing wounds?  No  Has the patient had any unintentional weight loss or weight gain?  No   Diabetes:  Is the patient diabetic?  Yes  If diabetic, was a CBG obtained today?  No  Did the patient bring in their glucometer from home?  No  How often do you monitor your CBG's? 2 x day .   Financial Strains and Diabetes Management:  Are you having any financial strains with the  device, your supplies or your medication? No .  Does the patient want to be seen by Chronic Care Management for management of their diabetes?  No  Would the patient like to be referred to a Nutritionist or for Diabetic Management?  No   Diabetic Exams:  Diabetic Eye Exam: Completed 06/2021 Diabetic Foot Exam: Overdue, Pt has been advised about the importance in completing this exam. Pt is scheduled for diabetic foot exam on next office vist .          Activities of Daily Living No flowsheet data found.  Patient Care Team: Panosh, Standley Brooking, MD as PCP - General Stanford Breed, Denice Bors, MD as PCP - Cardiology (Cardiology) Juanita Craver, MD (Gastroenterology) Stanford Breed Denice Bors, MD (Cardiology) Jackolyn Confer, MD as Consulting Physician (General Surgery) Georgeann Oppenheim, MD as Consulting Physician (Ophthalmology) Chesley Mires, MD as Consulting Physician (Pulmonary Disease) Angelia Mould, MD as Consulting Physician (Vascular Surgery) Eli Hose, [facility] (Inactive) as Pharmacist (Pharmacist) Grace Isaac, MD (Inactive) as Consulting Physician (Cardiothoracic Surgery)  Indicate any recent Medical Services you may have received from other than [facility] providers in the past year (date may be approximate).     Assessment:   This is a routine wellness examination for [place].  Hearing/Vision screen No results found.  Dietary issues and exercise activities discussed:     Goals Addressed   None    Depression Screen PHQ 2/9 Scores  07/24/2021 01/24/2021 07/21/2020 01/21/2020 01/13/2020 11/18/2019 10/12/2019  PHQ - 2 Score 0 0 0 0 0 0 0  PHQ- 9 Score 2 - 0 - - - -    Fall Risk Fall Risk  01/24/2021 07/21/2020 01/13/2020 12/13/2019 11/18/2019  Falls in the past year? 0 0 0 0 0  Comment - - - - -  Number falls in past yr: 0 - - - 0  Comment - - - - -  Injury with Fall? - - - - 0  Comment - - - - -  Follow up Falls evaluation completed - Falls evaluation completed - -    FALL  RISK PREVENTION PERTAINING TO THE HOME:  Any stairs in or around the home? No  If so, are there any without handrails? No  Home free of loose throw rugs in walkways, pet beds, electrical cords, etc? Yes  Adequate lighting in your home to reduce risk of falls? Yes   ASSISTIVE DEVICES UTILIZED TO PREVENT FALLS:  Life alert? No  Use of a cane, walker or w/c? No  Grab bars in the bathroom? No  Shower chair or bench in shower? No  Elevated toilet seat or a handicapped toilet? Yes   TIMED UP AND GO:   Cognitive Function: Normal cognitive status assessed by direct observation by this Nurse Health Advisor. No abnormalities found.   MMSE - Mini Mental State Exam 08/11/2018  Not completed: (No Data)        Immunizations Immunization History  Administered Date(s) Administered   Fluad Quad(high Dose 65+) 10/01/2019, 10/06/2020   Influenza Split 10/09/2011, 10/05/2012   Influenza Whole 10/24/2009, 10/25/2010   Influenza, High Dose Seasonal PF 08/31/2015, 09/30/2016, 09/30/2017, 10/19/2018   Influenza,inj,Quad PF,6+ Mos 10/26/2013, 11/08/2014   PFIZER Comirnaty(Gray Top)Covid-19 Tri-Sucrose Vaccine 07/25/2021   PFIZER(Purple Top)SARS-COV-2 Vaccination 02/05/2020, 03/01/2020, 10/28/2020   Pneumococcal Conjugate-13 05/09/2015   Pneumococcal Polysaccharide-23 04/24/2009, 06/25/2016   Td 03/22/2009   Tdap 04/06/2015   Zoster Recombinat (Shingrix) 08/04/2020   Zoster, Live 04/29/2012    TDAP status: Up to date  Flu Vaccine status: Up to date  Pneumococcal vaccine status: Up to date  Covid-19 vaccine status: Completed vaccines  Qualifies for Shingles Vaccine? Yes   Zostavax completed No   Shingrix Completed?: No.    Education has been provided regarding the importance of this vaccine. Patient has been advised to call insurance company to determine out of pocket expense if they have not yet received this vaccine. Advised may also receive vaccine at local pharmacy or Health Dept.  Verbalized acceptance and understanding.  Screening Tests Health Maintenance  Topic Date Due   Zoster Vaccines- Shingrix (2 of 2) 09/29/2020   FOOT EXAM  07/21/2021   INFLUENZA VACCINE  07/30/2021   COVID-19 Vaccine (5 - Booster for Pfizer series) 11/25/2021   OPHTHALMOLOGY EXAM  01/04/2022   HEMOGLOBIN A1C  01/13/2022   TETANUS/TDAP  04/05/2025   COLONOSCOPY (Pts 45-40yr Insurance coverage will need to be confirmed)  05/26/2030   Hepatitis C Screening  Completed   PNA vac Low Risk Adult  Completed   HPV VACCINES  Aged Out    Health Maintenance  Health Maintenance Due  Topic Date Due   Zoster Vaccines- Shingrix (2 of 2) 09/29/2020   FOOT EXAM  07/21/2021   INFLUENZA VACCINE  07/30/2021    Colorectal cancer screening: Type of screening: Colonoscopy. Completed 05/26/2020. Repeat every 10 years  Lung Cancer Screening: (Low Dose CT Chest recommended if Age 72-80years, 30 pack-year  currently smoking OR have quit w/in 15years.) does not qualify.   Lung Cancer Screening Referral: n/a  Additional Screening:  Hepatitis C Screening: does not qualify; Completed 12/19/2015  Vision Screening: Recommended annual ophthalmology exams for early detection of glaucoma and other disorders of the eye. Is the patient up to date with their annual eye exam?  Yes  Who is the provider or what is the name of the office in which the patient attends annual eye exams? Dr.Cohen  If pt is not established with a provider, would they like to be referred to a provider to establish care? No .   Dental Screening: Recommended annual dental exams for proper oral hygiene  Community Resource Referral / Chronic Care Management: CRR required this visit?  No   CCM required this visit?  No      Plan:     I have personally reviewed and noted the following in the patient's chart:   Medical and social history Use of alcohol, tobacco or illicit drugs  Current medications and supplements including opioid  prescriptions. Patient is not currently taking opioid prescriptions. Functional ability and status Nutritional status Physical activity Advanced directives List of other physicians Hospitalizations, surgeries, and ER visits in previous 12 months Vitals Screenings to include cognitive, depression, and falls Referrals and appointments  In addition, I have reviewed and discussed with patient certain preventive protocols, quality metrics, and best practice recommendations. A written personalized care plan for preventive services as well as general preventive health recommendations were provided to patient.     Randel Pigg, LPN   05/04/15   Nurse Notes: none

## 2021-08-03 ENCOUNTER — Other Ambulatory Visit: Payer: Self-pay | Admitting: *Deleted

## 2021-08-03 ENCOUNTER — Encounter: Payer: Self-pay | Admitting: Surgery

## 2021-08-03 ENCOUNTER — Other Ambulatory Visit: Payer: Self-pay

## 2021-08-03 ENCOUNTER — Institutional Professional Consult (permissible substitution) (INDEPENDENT_AMBULATORY_CARE_PROVIDER_SITE_OTHER): Payer: Medicare Other | Admitting: Surgery

## 2021-08-03 ENCOUNTER — Ambulatory Visit (INDEPENDENT_AMBULATORY_CARE_PROVIDER_SITE_OTHER): Payer: Medicare Other | Admitting: Primary Care

## 2021-08-03 ENCOUNTER — Encounter: Payer: Self-pay | Admitting: Primary Care

## 2021-08-03 VITALS — BP 163/64 | HR 65 | Resp 20 | Ht 73.0 in | Wt 295.0 lb

## 2021-08-03 VITALS — BP 142/58 | HR 68 | Ht 73.0 in | Wt 298.4 lb

## 2021-08-03 DIAGNOSIS — R0609 Other forms of dyspnea: Secondary | ICD-10-CM

## 2021-08-03 DIAGNOSIS — I351 Nonrheumatic aortic (valve) insufficiency: Secondary | ICD-10-CM

## 2021-08-03 DIAGNOSIS — R0600 Dyspnea, unspecified: Secondary | ICD-10-CM

## 2021-08-03 DIAGNOSIS — I712 Thoracic aortic aneurysm, without rupture, unspecified: Secondary | ICD-10-CM

## 2021-08-03 DIAGNOSIS — J449 Chronic obstructive pulmonary disease, unspecified: Secondary | ICD-10-CM

## 2021-08-03 DIAGNOSIS — J9691 Respiratory failure, unspecified with hypoxia: Secondary | ICD-10-CM | POA: Insufficient documentation

## 2021-08-03 DIAGNOSIS — R0602 Shortness of breath: Secondary | ICD-10-CM | POA: Diagnosis not present

## 2021-08-03 DIAGNOSIS — J432 Centrilobular emphysema: Secondary | ICD-10-CM

## 2021-08-03 DIAGNOSIS — J9621 Acute and chronic respiratory failure with hypoxia: Secondary | ICD-10-CM

## 2021-08-03 DIAGNOSIS — I480 Paroxysmal atrial fibrillation: Secondary | ICD-10-CM

## 2021-08-03 DIAGNOSIS — I272 Pulmonary hypertension, unspecified: Secondary | ICD-10-CM

## 2021-08-03 DIAGNOSIS — G4733 Obstructive sleep apnea (adult) (pediatric): Secondary | ICD-10-CM

## 2021-08-03 DIAGNOSIS — J9601 Acute respiratory failure with hypoxia: Secondary | ICD-10-CM | POA: Insufficient documentation

## 2021-08-03 NOTE — Assessment & Plan Note (Signed)
-   Appears to be in regular rhythm on exam today  - Continue Tikosyn and Eliquis

## 2021-08-03 NOTE — Assessment & Plan Note (Signed)
-   PFTs in 2019 showed normal spirometry with severe diffusion defect. COPD dose not appear acutely exacerbated. He denies chest tightness, wheezing or cough. Continue Anoro Ellipta one puff daily in the morning and prn Albuterol hfa q 6 hours for shortness of breath/wheezing. Recommend repeating pulmonary function testing to assess lung function

## 2021-08-03 NOTE — Assessment & Plan Note (Signed)
-   Secondary to COPD and OSA

## 2021-08-03 NOTE — Assessment & Plan Note (Signed)
-   Multifactorial d/t pulmonary HTN, moderate-severe aortic insufficiency and COPD

## 2021-08-03 NOTE — Assessment & Plan Note (Addendum)
-    Scheduled for Aortic valve replacement, possible aortic root replacement and CABG if indicated with Dr. Lynnette Caffey in September 2022

## 2021-08-03 NOTE — Progress Notes (Signed)
Cardiothoracic Surgery Consultation  PCP is Panosh, Standley Brooking, MD Referring Provider is Lelon Perla, MD  Chief Complaint  Patient presents with   Aortic Insuffiency    Initial surgical consult, Cath 7/18, TEE 7/12    HPI:  The patient is a 70 year old gentleman with a history of paroxysmal atrial fibrillation status post multiple cardioversions in the past who has been maintained on Tikosyn, coronary disease status post stenting of his LAD in 2003, hyperlipidemia, OSA on CPAP, who had MSSA sepsis with development of lumbar discitis and an infected left sternoclavicular joint in 08/2019.  He underwent incision and drainage and debridement of left sternoclavicular joint with application of a wound VAC by Dr. Servando Snare on 08/31/2019.  He was treated with a long course of intravenous antibiotics followed by oral Keflex.  This resolved without difficulty.  A CT scan of the chest also showed a mildly dilated ascending aorta measuring 4.4 cm.  He had an echocardiogram on 08/29/2019 when he presented with sepsis and this showed a trileaflet aortic valve with mild sclerosis and trivial regurgitation.  There is no evidence of stenosis.  There is no vegetation on the aortic valve.  His left ventricular ejection fraction at that time was 60 to 65% with normal LV cavity size measured at about 5 cm during diastole.  He now presents with recent onset of exertional dyspnea and fatigue.  A 2D echocardiogram on 05/25/2021 showed moderate to severe aortic insufficiency with a pressure half-time of 307 ms.  Mean gradient across aortic valve was 10 mmHg and a peak gradient was 21.2 mmHg.  Left ventricular ejection fraction was 55 to 60% with new dilatation of the LV cavity measured at 6.5 cm during diastole and 5.1 cm during systole.  There was trivial mitral regurgitation and mild tricuspid regurgitation.  Right ventricular function was normal.  He subsequently had a TEE performed on 07/10/2021 which showed an ejection  fraction of 55 to 60%.  There is mild dilatation of the ascending aorta at 4.3 cm.  There is severe aortic insufficiency with pressure half-time of 485 ms.  The aortic root diameter is measured at 3.6 cm and the ascending aorta measured at 4.2 cm.  He subsequently underwent cardiac catheterization showing that the previously placed mid LAD stent was widely patent.  There is moderate pulmonary hypertension and 67/20 with a mean of 39.  Mean pulmonary capillary wedge pressure was 17 mmHg.  There is mild diffuse nonobstructive coronary disease.  He is here today by himself.  He reports exertional shortness of breath and fatigue.  He has had shortness of breath at rest at times as well as some orthopnea.  He has had some lower extremity swelling in his ankles and feet.  He denies any dizziness or syncope.  He has not had a cardioversion in several years but has noticed some irregular rhythm that thought he was in atrial fibrillation.  Past Medical History:  Diagnosis Date   Atrial fibrillation ([place]) 03/22/2009   a. s/p multiple DCCV; b. no coumadin due to low TE risk profile; c. Tikosyn Rx   Coronary atherosclerosis of native coronary artery 11/2002   a. s/p stent to LAD 12/03; OM2 occluded at cath 12/03; d. myoview 5/10: no ischemia;  e. echo 7/11: EF 55%, BAE, mild RVE, PASP 41-45; Myoview was in March 2013. 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[place])    2.6 to be evaluated incidental finding on CT   LATERAL EPICONDYLITIS, LEFT 10/24/2009   LIVER FUNCTION TESTS, ABNORMAL 04/25/2010   Local reaction to  immunization 05/05/2012   minor resolving  zostavax    Myocardial infarction [facility]) mi2003   Numbness in left leg    foot related to back disease and surgery   Obesity, unspecified 04/24/2009   Perforated appendicitis with necrosis s/p open appendectomy 06/07/14 06/04/2014   Renal cyst    Characterized by MRI as simple   Ruptured suppurative appendicitis    2015    SLEEP APNEA, OBSTRUCTIVE 03/22/2009   compliant with CPAP   THROMBOCYTOPENIA 08/16/2010   TOBACCO USE, QUIT 10/24/2009   ULNAR NEUROPATHY, LEFT 07/01/5008   Umbilical hernia     Past Surgical History:  Procedure Laterality Date   APPENDECTOMY N/A 06/06/2014   Procedure: APPENDECTOMY;  Surgeon: Odis Hollingshead, MD;  Location: [facility];  Service: General;  Laterality: N/A;   COLON SURGERY     COLONOSCOPY  11/15/2011   Procedure: COLONOSCOPY;  Surgeon: Juanita Craver, MD;  Location: [facility];  Service: Endoscopy;  Laterality: N/A;   COLONOSCOPY WITH PROPOFOL N/A 01/03/2017   Procedure: COLONOSCOPY WITH PROPOFOL;  Surgeon: Carol Ada, MD;  Location: [facility];  Service: Endoscopy;  Laterality: N/A;   COLONOSCOPY WITH PROPOFOL N/A 05/26/2020   Procedure: COLONOSCOPY WITH PROPOFOL;  Surgeon: Carol Ada, MD;  Location: [facility];  Service: Endoscopy;  Laterality: N/A;   cyst on epiglottis  08/2002   ESOPHAGOGASTRODUODENOSCOPY  11/15/2011   Procedure: ESOPHAGOGASTRODUODENOSCOPY (EGD);  Surgeon: Juanita Craver, MD;  Location: [facility];  Service: Endoscopy;  Laterality: N/A;   LAPAROSCOPIC APPENDECTOMY N/A 06/06/2014   Procedure: APPENDECTOMY LAPAROSCOPIC attemted;  Surgeon: Odis Hollingshead, MD;  Location: [facility];  Service: General;  Laterality: N/A;   LUMBAR DISC SURGERY     two  holes in spinalcovering   POLYPECTOMY  05/26/2020   Procedure: POLYPECTOMY;  Surgeon: Carol Ada, MD;  Location: [facility];  Service: Endoscopy;;   RIGHT/LEFT HEART CATH AND CORONARY ANGIOGRAPHY N/A 07/16/2021   Procedure: RIGHT/LEFT HEART CATH AND  CORONARY ANGIOGRAPHY;  Surgeon: Jettie Booze, MD;  Location: [place] [facility];  Service: Cardiovascular;  Laterality: N/A;   stent     LAD DUS 2004   STERNAL WOUND DEBRIDEMENT Left 08/31/2019   Procedure: LEFT STERNOCLAVICULAR WOUND DEBRIDEMENT WITH APPLICATION OF WOUND VAC;  Surgeon: Grace Isaac, MD;  Location: [place];  Service: Thoracic;  Laterality: Left;   surgery l4-l5   1998   ruptured x 3   TEE WITHOUT CARDIOVERSION N/A 07/10/2021   Procedure: TRANSESOPHAGEAL ECHOCARDIOGRAM (TEE);  Surgeon: Lelon Perla, MD;  Location: [facility] ENDOSCOPY;  Service: Cardiovascular;  Laterality: N/A;   ulnar neuropathy     UMBILICAL HERNIA REPAIR     mesh    Family History  Problem Relation Age of Onset   Thyroid disease Mother    Ovarian cancer Mother    Breast cancer Mother    Lung cancer Father    Cancer Father     Social History Social History   Tobacco Use  Smoking status: Former    Packs/day: 2.00    Years: 42.00    Pack years: 84.00    Types: Cigarettes    Quit date: 12/30/2008    Years since quitting: 12.6   Smokeless tobacco: Never   Tobacco comments:    started at age 76; 1-2 ppd; quit 2010  Vaping Use   Vaping Use: Never used  Substance Use Topics   Alcohol use: No    Alcohol/week: 0.0 standard drinks    Comment: rarely; maybe 1 beer a year   Drug use: No    Current Outpatient Medications  Medication Sig Dispense Refill   acetaminophen (TYLENOL) 325 MG tablet Take 650 mg by mouth every 6 (six) hours as needed for mild pain.     albuterol (PROVENTIL HFA;VENTOLIN HFA) 108 (90 Base) MCG/ACT inhaler Inhale 2 puffs into the lungs every 6 (six) hours as needed for wheezing or shortness of breath. 1 Inhaler 0   ALPRAZolam (XANAX) 0.5 MG tablet Take 1 tablet (0.5 mg total) by mouth 2 (two) times daily as needed for anxiety. 30 tablet 0   apixaban (ELIQUIS) 5 MG TABS tablet Take 1 tablet (5 mg total) by mouth 2 (two) times daily. 180 tablet 3   atorvastatin  (LIPITOR) 80 MG tablet Take 1 tablet (80 mg total) by mouth daily.     Blood Glucose Monitoring Suppl (ONETOUCH VERIO) w/Device KIT Use to test blood sugars 1-2 times daily. 1 kit 1   brimonidine (ALPHAGAN) 0.15 % ophthalmic solution Place 1 drop into both eyes 3 (three) times daily.     busPIRone (BUSPAR) 10 MG tablet Take 1 tablet (10 mg total) by mouth 2 (two) times daily. 180 tablet 1   busPIRone (BUSPAR) 15 MG tablet TAKE 1/3 TABLET TWICE A DAY FOR 1 WEEK, THEN 2/3 TAB TWICE DAILY X1 WEEK, THEN 1 TAB TWICE A DAY 30 tablet 1   Cholecalciferol (VITAMIN D) 50 MCG (2000 UT) tablet Take 200-4,000 Units by mouth See admin instructions. Take 2000 units by mouth in the morning and 4000 units in the evening     COVID-19 mRNA Vac-TriS, Pfizer, (PFIZER-BIONT COVID-19 VAC-TRIS) SUSP injection Inject into the muscle. 0.3 mL 0   diphenhydrAMINE (BENADRYL) 25 MG tablet Take 25-50 mg by mouth at bedtime as needed for sleep.      dofetilide (TIKOSYN) 500 MCG capsule Take 1 capsule (500 mcg total) by mouth 2 (two) times daily.     famotidine (PEPCID) 10 MG tablet Take 10 mg by mouth as needed for heartburn.      fexofenadine (ALLEGRA) 180 MG tablet Take 180 mg by mouth daily as needed for allergies.      fish oil-omega-3 fatty acids 1000 MG capsule Take 1 g by mouth 2 (two) times daily.     fluticasone (FLONASE) 50 MCG/ACT nasal spray Place 2 sprays into both nostrils daily.     hydrALAZINE (APRESOLINE) 100 MG tablet Take 1 tablet (100 mg total) by mouth 3 (three) times daily. 270 tablet 3   Lancets (ONETOUCH ULTRASOFT) lancets Twice daily 100 each 6   losartan (COZAAR) 100 MG tablet Take 1 tablet (100 mg total) by mouth daily. 90 tablet 3   Magnesium 250 MG TABS Take 250-500 mg by mouth See admin instructions. Takes 250 mg by mouth in the morning and 500 mg in the evening     metFORMIN (GLUCOPHAGE-XR) 500 MG 24 hr tablet Take 1 tablet (500 mg total) by mouth in the morning, at noon,  and at bedtime.      methocarbamol (ROBAXIN) 500 MG tablet TAKE 1 TABLET BY MOUTH 4 TIMES A DAY AS NEEDED FOR MUSCLE SPASM (Patient taking differently: Take 500 mg by mouth 4 (four) times daily as needed for muscle spasms.) 120 tablet 2   MULTIPLE VITAMIN PO Take 1 tablet by mouth every evening.     nitroGLYCERIN (NITROSTAT) 0.4 MG SL tablet Place 1 tablet (0.4 mg total) under the tongue every 5 (five) minutes as needed for chest pain. 25 tablet 12   ONETOUCH VERIO test strip USE AS DIRECTED TWICE A DAY 50 strip 12   pantoprazole (PROTONIX) 40 MG tablet Take 40 mg by mouth daily as needed (acid reflux).     PARoxetine (PAXIL) 20 MG tablet TAKE 2&1/2 TABLETS BY MOUTH DAILY 90 tablet 1   potassium chloride SA (KLOR-CON M20) 20 MEQ tablet Take 1 tablet (20 mEq total) by mouth daily.     tamsulosin (FLOMAX) 0.4 MG CAPS capsule Take 1 capsule (0.4 mg total) by mouth daily as needed (repeat kidney stone).     traZODone (DESYREL) 50 MG tablet TAKE 1 TABLET BY MOUTH EVERYDAY AT BEDTIME 90 tablet 3   triamcinolone cream (KENALOG) 0.1 % Apply 1 application topically 2 (two) times daily as needed (irritation).     umeclidinium-vilanterol (ANORO ELLIPTA) 62.5-25 MCG/INH AEPB Inhale 1 puff into the lungs daily.     Current Facility-Administered Medications  Medication Dose Route Frequency Provider Last Rate Last Admin   sodium chloride flush (NS) 0.9 % injection 3 mL  3 mL Intravenous Q12H Lelon Perla, MD        Allergies  Allergen Reactions   Pseudoephedrine Other (See Comments)    Patient went into afib   Diltiazem Rash and Itching    Also 2019   Novocain [Procaine] Hives    Dentist appointment in 1958; since then has tolerated lidocaine and provocaine with no hives or difficulty.   Quinolones     Patient was warned about not using Cipro and similar antibiotics. Recent studies have raised concern that fluoroquinolone antibiotics could be associated with an increased risk of aortic aneurysm Fluoroquinolones have  non-antimicrobial properties that might jeopardise the integrity of the extracellular matrix of the vascular wall In a  propensity score matched cohort study in [place], there was a 66% increased rate of aortic aneurysm or dissection associated with oral fluoroquinolone use, compared wit   Sulfonamide Derivatives Other (See Comments)    Childhood reaction     Review of Systems  Constitutional:  Positive for activity change, chills and fatigue. Negative for fever.  HENT:  Positive for hearing loss.        Sees his dentist regularly  Eyes: Negative.   Respiratory:  Positive for shortness of breath. Negative for chest tightness.   Cardiovascular:  Positive for palpitations and leg swelling. Negative for chest pain.  Gastrointestinal: Negative.   Genitourinary:        Kidney stones  Musculoskeletal: Negative.   Skin: Negative.   Allergic/Immunologic: Negative.   Neurological:  Positive for numbness. Negative for dizziness and syncope.       Numbness in feet since lumbar surgery 1998  Hematological:  Bruises/bleeds easily.  Psychiatric/Behavioral:  The patient is nervous/anxious.    BP (!) 163/64 (BP Location: Right Arm, Patient Position: Sitting)   Pulse 65   Resp 20   Ht 6' 1"  (1.854 m)   Wt 295 lb (133.8 kg)   SpO2 (!) 82% Comment: RA  BMI 38.92 kg/m  Physical Exam Constitutional:      Appearance: He is obese.  HENT:     Head: Normocephalic and atraumatic.  Eyes:     Extraocular Movements: Extraocular movements intact.     Conjunctiva/sclera: Conjunctivae normal.     Pupils: Pupils are equal, round, and reactive to light.  Cardiovascular:     Rate and Rhythm: Normal rate and regular rhythm.     Pulses: Normal pulses.     Heart sounds: Murmur heard.     Comments: 2/6 systolic and diastolic murmur along the right lower sternal border Pulmonary:     Effort: Pulmonary effort is normal.     Breath sounds: Normal breath sounds.  Abdominal:     Tenderness: There is no  abdominal tenderness.  Musculoskeletal:        General: Swelling present.     Cervical back: Normal range of motion and neck supple.  Skin:    General: Skin is warm and dry.  Neurological:     General: No focal deficit present.     Mental Status: He is alert and oriented to person, place, and time.  Psychiatric:        Mood and Affect: Mood normal.        Behavior: Behavior normal.     Diagnostic Tests:    ECHOCARDIOGRAM REPORT         Patient Name:   [name] [name]. Date of Exam: 05/25/2021  Medical Rec #:  [id number]          Height:       73.0 in  Accession #:    [id number]         Weight:       303.0 lb  Date of Birth:  03/23/1951         BSA:          2.567 m  Patient Age:    30 years           BP:           132/50 mmHg  Patient Gender: M                  HR:           59 bpm.  Exam Location:  Outpatient   Procedure: 2D Echo   Indications:    Murmur     History:        Patient has prior history of Echocardiogram examinations,  most                  recent 08/29/2019. Previous Myocardial Infarction, Aortic                  Aneurysm, Arrythmias:Atrial Fibrillation; Risk  Factors:Diabetes.     Sonographer:    Mikki Santee RDCS  Referring Phys: [place]     1. Left ventricular ejection fraction, by estimation, is 55 to 60%. The  left ventricle has normal function. The left ventricle has no regional  wall motion abnormalities. The left ventricular internal cavity size was  severely dilated. Left ventricular  diastolic parameters are consistent with Grade I diastolic dysfunction  (impaired relaxation).   2. Right ventricular systolic function is normal. The right ventricular  size is normal. There is normal pulmonary artery systolic pressure.   3. Left atrial size was moderately dilated.   4. Right atrial size was mildly dilated.   5. The mitral valve  is normal in structure. Trivial mitral valve  regurgitation. No evidence of  mitral stenosis. Severe mitral annular  calcification.   6. The aortic valve is tricuspid. Aortic valve regurgitation is moderate  to severe. Mild aortic valve stenosis.   7. Aortic dilatation noted. There is mild dilatation of the ascending  aorta, measuring 43 mm.   8. The inferior vena cava is dilated in size with >50% respiratory  variability, suggesting right atrial pressure of 8 mmHg.   FINDINGS   Left Ventricle: Left ventricular ejection fraction, by estimation, is 55  to 60%. The left ventricle has normal function. The left ventricle has no  regional wall motion abnormalities. The left ventricular internal cavity  size was severely dilated. There  is no left ventricular hypertrophy. Left ventricular diastolic parameters  are consistent with Grade I diastolic dysfunction (impaired relaxation).   Right Ventricle: The right ventricular size is normal.Right ventricular  systolic function is normal. There is normal pulmonary artery systolic  pressure. The tricuspid regurgitant velocity is 2.63 m/s, and with an  assumed right atrial pressure of 8 mmHg,  the estimated right ventricular systolic pressure is 49.4 mmHg.   Left Atrium: Left atrial size was moderately dilated.   Right Atrium: Right atrial size was mildly dilated.   Pericardium: There is no evidence of pericardial effusion.   Mitral Valve: The mitral valve is normal in structure. Severe mitral  annular calcification. Trivial mitral valve regurgitation. No evidence of  mitral valve stenosis.   Tricuspid Valve: The tricuspid valve is normal in structure. Tricuspid  valve regurgitation is mild . No evidence of tricuspid stenosis.   Aortic Valve: The aortic valve is tricuspid. Aortic valve regurgitation is  moderate to severe. Aortic regurgitation PHT measures 307 msec. Mild  aortic stenosis is present. Aortic valve mean gradient measures 10.0 mmHg.  Aortic valve peak gradient measures   21.2 mmHg. Aortic valve area,  by VTI measures 1.71 cm.   Pulmonic Valve: The pulmonic valve was normal in structure. Pulmonic valve  regurgitation is mild. No evidence of pulmonic stenosis.   Aorta: Aortic dilatation noted. There is mild dilatation of the ascending  aorta, measuring 43 mm.   Venous: The inferior vena cava is dilated in size with greater than 50%  respiratory variability, suggesting right atrial pressure of 8 mmHg.   IAS/Shunts: No atrial level shunt detected by color flow Doppler.      LEFT VENTRICLE  PLAX 2D  LVIDd:         6.50 cm  Diastology  LVIDs:         5.10 cm  LV e' medial:    7.72 cm/s  LV PW:         1.00 cm  LV E/e' medial:  10.1  LV IVS:        1.00 cm  LV e' lateral:   5.87 cm/s  LVOT diam:     2.30 cm  LV E/e' lateral: 13.2  LV SV:         90  LV SV Index:   35  LVOT Area:     4.15 cm      RIGHT VENTRICLE  RV S prime:     10.90 cm/s  TAPSE (M-mode): 2.4 cm   LEFT ATRIUM              Index       RIGHT ATRIUM           Index  LA diam:  4.60 cm  1.79 cm/m  RA Area:     30.10 cm  LA Vol (A2C):   132.0 ml 51.42 ml/m RA Volume:   106.00 ml 41.29 ml/m  LA Vol (A4C):   121.0 ml 47.13 ml/m  LA Biplane Vol: 134.0 ml 52.20 ml/m   AORTIC VALVE  AV Area (Vmax):    2.31 cm  AV Area (Vmean):   2.34 cm  AV Area (VTI):     1.71 cm  AV Vmax:           230.00 cm/s  AV Vmean:          145.000 cm/s  AV VTI:            0.528 m  AV Peak Grad:      21.2 mmHg  AV Mean Grad:      10.0 mmHg  LVOT Vmax:         128.00 cm/s  LVOT Vmean:        81.700 cm/s  LVOT VTI:          0.217 m  LVOT/AV VTI ratio: 0.41  AI PHT:            307 msec     AORTA  Ao Root diam: 3.50 cm  Ao Asc diam:  4.30 cm   MITRAL VALVE               TRICUSPID VALVE  MV Area (PHT): 1.98 cm    TR Peak grad:   27.7 mmHg  MV Decel Time: 384 msec    TR Vmax:        263.00 cm/s  MV E velocity: 77.70 cm/s  MV A velocity: 98.50 cm/s  SHUNTS  MV E/A ratio:  0.79        Systemic VTI:  0.22 m                              Systemic Diam: 2.30 cm   [name] MD  Electronically signed by [name] MD  Signature Date/Time: 05/25/2021/12:09:22 PM         Final     TRANSESOPHOGEAL ECHO REPORT         Patient Name:   [name] [name]. Date of Exam: 07/10/2021  Medical Rec #:  [id number]          Height:       73.0 in  Accession #:    [id number]         Weight:       297.8 lb  Date of Birth:  January 08, 1951         BSA:          2.548 m  Patient Age:    56 years           BP:           115/101 mmHg  Patient Gender: M                  HR:           52 bpm.  Exam Location:  Outpatient   Procedure: 3D Echo, Cardiac Doppler, Color Doppler and Transesophageal  Echo   Indications:     I35.1 Nonrheumatic aortic (valve) insufficiency     History:         Patient has prior history of Echocardiogram examinations,  most  recent 06/06/2016. Previous Myocardial Infarction and CAD,                   Arrythmias:Atrial Fibrillation; Risk  Factors:Dyslipidemia,                   Former Smoker and Diabetes. Aneurysm.     Sonographer:     Jonelle Sidle Dance  Referring Phys:  [place]  Diagnosing Phys: [name] MD   PROCEDURE: The transesophogeal probe was passed without difficulty through  the esophogus of the patient. Sedation performed by different physician.  The patient was monitored while under deep sedation. Anesthestetic  sedation was provided intravenously by  Anesthesiology: 344.31m of Propofol, 1039mof Lidocaine. The patient  developed no complications during the procedure.   IMPRESSIONS     1. Dilated ascending aorta; severe, cental aortic insufficiency.   2. Left ventricular ejection fraction, by estimation, is 55 to 60%. The  left ventricle has normal function. The left ventricular internal cavity  size was moderately dilated.   3. Right ventricular systolic function is normal. The right ventricular  size is mildly enlarged.   4. Left atrial  size was moderately dilated. No left atrial/left atrial  appendage thrombus was detected.   5. Right atrial size was mildly dilated.   6. The mitral valve is normal in structure. Mild mitral valve  regurgitation.   7. The aortic valve is tricuspid. Aortic valve regurgitation is severe.   8. Aortic dilatation noted. There is mild dilatation of the ascending  aorta, measuring 43 mm. There is mild (Grade II) plaque involving the  descending aorta.   FINDINGS   Left Ventricle: Left ventricular ejection fraction, by estimation, is 55  to 60%. The left ventricle has normal function. The left ventricular  internal cavity size was moderately dilated.   Right Ventricle: The right ventricular size is mildly enlarged. Right  vetricular wall thickness was not assessed. Right ventricular systolic  function is normal.   Left Atrium: Left atrial size was moderately dilated. No left atrial/left  atrial appendage thrombus was detected.   Right Atrium: Right atrial size was mildly dilated.   Pericardium: There is no evidence of pericardial effusion.   Mitral Valve: The mitral valve is normal in structure. Mild mitral valve  regurgitation.   Tricuspid Valve: The tricuspid valve is normal in structure. Tricuspid  valve regurgitation is mild.   Aortic Valve: The aortic valve is tricuspid. Aortic valve regurgitation is  severe. Aortic regurgitation PHT measures 485 msec.   Pulmonic Valve: The pulmonic valve was normal in structure. Pulmonic valve  regurgitation is mild.   Aorta: Aortic dilatation noted. There is mild dilatation of the ascending  aorta, measuring 43 mm. There is mild (Grade II) plaque involving the  descending aorta.   IAS/Shunts: The interatrial septum is aneurysmal. No atrial level shunt  detected by color flow Doppler.   Additional Comments: Dilated ascending aorta; severe, cental aortic  insufficiency.      AORTIC VALVE  AI PHT:      485 msec     AORTA  Ao Root  diam: 3.60 cm  Ao Asc diam:  4.20 cm   BrKirk RuthsD  Electronically signed by BrKirk RuthsD  Signature Date/Time: 07/10/2021/10:18:37 AM         Final     Physicians  Panel Physicians Referring Physician Case Authorizing Physician  VaJettie BoozeMD (Primary)      Procedures  RIGHT/LEFT HEART CATH  AND CORONARY ANGIOGRAPHY    Conclusion      Previously placed Mid LAD stent (unknown type) is  widely patent.   LV end diastolic pressure is mildly elevated.   Hemodynamic findings consistent with moderate pulmonary hypertension.   There is no aortic valve stenosis.   Mild, diffuse nonobstructive CAD   On 4L [place], Ao sat 91%, PA sat 73%, PA pressure 67/20, mean PA 39 mm Hg; mean PCWP 17 mm Hg; CO 10.1 L/min; CI 4.   Due to dilated aortic root, JL 5  for left and AL2 for RCA may be the best catheters.     Continue with plans for surgical evaluation for AVR.    Recommendations  Antiplatelet/Anticoag Recommend to resume Apixaban, at currently prescribed dose and frequency on 07/17/2021.      Procedural Details  Technical Details The risks, benefits, and details of the procedure were explained to the patient.  The patient verbalized understanding and wanted to proceed.  Informed written consent was obtained.  PROCEDURE TECHNIQUE:  After Xylocaine anesthesia, a 5 French sheath was placed in the right antecubital area in exchange for a peripheral IV. A 5 French balloontipped Swan-Ganz catheter was advanced to the pulmonary artery under fluoroscopic guidance. Hemodynamic pressures were obtained. Oxygen saturations were obtained. After Xylocaine anesthesia, a 66F sheath was placed in the right radial artery with a single anterior needle wall stick.   Left heart cath was done using a JR4 catheter. Left coronary angiography was done using a Judkins L4 guide catheter. Right coronary angiography was done using a Judkins R4 guide catheter, but AL1 worked better.  TR band for  hemostasis of radial artery.     Contrast: 75 cc Estimated blood loss <50 mL.   During this procedure medications were administered to achieve and maintain moderate conscious sedation while the patient's heart rate, blood pressure, and oxygen saturation were continuously monitored and I was present face-to-face 100% of this time.    Medications (Filter: Administrations occurring from [phone] to 1037 on 07/16/21)  important  Continuous medications are totaled by the amount administered until 07/16/21 1037.    Heparin (Porcine) in NaCl 1000-0.9 UT/500ML-% SOLN (mL) Total volume:  1,000 mL  Date/Time Rate/Dose/Volume Action   07/16/21 0954 500 mL Given   0954 500 mL Given    midazolam (VERSED) injection (mg) Total dose:  1 mg  Date/Time Rate/Dose/Volume Action   07/16/21 1004 1 mg Given    fentaNYL (SUBLIMAZE) injection (mcg) Total dose:  25 mcg  Date/Time Rate/Dose/Volume Action   07/16/21 1004 25 mcg Given    lidocaine (PF) (XYLOCAINE) 1 % injection (mL) Total volume:  4 mL  Date/Time Rate/Dose/Volume Action   07/16/21 1007 2 mL Given   1007 2 mL Given    Radial Cocktail/Verapamil only (mL) Total volume:  10 mL  Date/Time Rate/Dose/Volume Action   07/16/21 1009 10 mL Given    heparin sodium (porcine) injection (Units) Total dose:  6,500 Units  Date/Time Rate/Dose/Volume Action   07/16/21 1015 6,500 Units Given    iohexol (OMNIPAQUE) 350 MG/ML injection (mL) Total volume:  75 mL  Date/Time Rate/Dose/Volume Action   07/16/21 1029 75 mL Given     Sedation Time  Sedation Time Physician-1: 24 minutes 34 seconds   Contrast  Medication Name Total Dose  iohexol (OMNIPAQUE) 350 MG/ML injection 75 mL    Radiation/Fluoro  Fluoro time: 5.2 (min) DAP: 33.7 (Gycm2) Cumulative Air Kerma: 457.4 (mGy)   Coronary Findings   Diagnostic Dominance:  Right  Left Anterior Descending  The vessel exhibits minimal luminal irregularities.  Previously placed Mid  LAD stent (unknown type) is widely patent.  Left Circumflex  The vessel exhibits minimal luminal irregularities.  Right Coronary Artery  The vessel exhibits minimal luminal irregularities.   Intervention   No interventions have been documented.    Right Heart  Right Heart Pressures Hemodynamic findings consistent with moderate pulmonary hypertension. On 4L [place], Ao sat 91%, PA sat 73%, PA pressure 67/20, mean PA 39 mm Hg; mean PCWP 17 mm Hg; CO 10.1 L/min; CI 4.         Left Heart  Left Ventricle LV end diastolic pressure is mildly elevated.  Aortic Valve There is no aortic valve stenosis.    Coronary Diagrams   Diagnostic Dominance: Right    Intervention     Implants     No implant documentation for this case.    Syngo Images   Show images for CARDIAC CATHETERIZATION  Images on Long Term Storage   Show images for Jes, [place]. "JIM"  Link to Procedure Log  Procedure Log      Hemo Data  Flowsheet Row Most Recent Value  Fick Cardiac Output 10.11 L/min  Fick Cardiac Output Index 4 (L/min)/BSA  RA A Wave 15 mmHg  RA V Wave 15 mmHg  RA Mean 11 mmHg  RV Systolic Pressure 71 mmHg  RV Diastolic Pressure 7 mmHg  RV EDP 18 mmHg  PA Systolic Pressure 67 mmHg  PA Diastolic Pressure 20 mmHg  PA Mean 39 mmHg  PW A Wave 21 mmHg  PW V Wave 19 mmHg  PW Mean 17 mmHg  AO Systolic Pressure 440 mmHg  AO Diastolic Pressure 50 mmHg  AO Mean 85 mmHg  LV Systolic Pressure 347 mmHg  LV Diastolic Pressure 10 mmHg  LV EDP 22 mmHg  AOp Systolic Pressure 425 mmHg  AOp Diastolic Pressure 53 mmHg  AOp Mean Pressure 86 mmHg  LVp Systolic Pressure 956 mmHg  LVp Diastolic Pressure 9 mmHg  LVp EDP Pressure 18 mmHg  QP/QS 1  TPVR Index 9.77 HRUI  TSVR Index 21.29 HRUI  PVR SVR Ratio 0.3  TPVR/TSVR Ratio 0.46    ADDENDUM REPORT: 06/14/2021 09:58   ADDENDUM: Comment: There is enlargement of the main pulmonary outflow tract measuring 4.6 cm, a finding  felt to be indicative of underlying pulmonary arterial hypertension.     Electronically Signed   By: Lowella Grip III M.D.   On: 06/14/2021 09:58    Addended by Erling Conte, MD on 06/14/2021 10:01 AM    Study Result  Narrative & Impression  CLINICAL DATA:  Thoracic aortic prominence   EXAM: CT ANGIOGRAPHY CHEST WITH CONTRAST   TECHNIQUE: Multidetector CT imaging of the chest was performed using the standard protocol during bolus administration of intravenous contrast. Multiplanar CT image reconstructions and MIPs were obtained to evaluate the vascular anatomy.   CONTRAST:  2m ISOVUE-370 IOPAMIDOL (ISOVUE-370) INJECTION 76%   COMPARISON:  June 15, 2020 and August 20, 2019 CT examinations.   FINDINGS: Cardiovascular: Ascending thoracic aortic diameter is stable, measuring 4.4 x 4.4 cm. Measured diameter at the sinuses of Valsalva measures 3.8 cm, stable. Measured diameter at the level of the aortic arch measures 3.5 cm, stable. Measured diameter of the descending aorta at the main pulmonary outflow tract level measures 3.2 x 3.2 cm. No evident thoracic aortic dissection. There are scattered foci of calcification in visualized great vessels. There is  aortic atherosclerosis at several levels in the thoracic region. There are foci of coronary artery calcification within apparent stent in the left anterior descending coronary artery. There is no pericardial effusion or pericardial thickening. No demonstrable pulmonary embolus.   Mediastinum/Nodes: Thyroid appears unremarkable. There is a stable mildly prominent lymph node in the aortopulmonary window region with short axis diameter of 1.1 cm. There are several subcentimeter lymph nodes elsewhere, stable. No esophageal lesions evident.   Lungs/Pleura: Nodular opacity abutting the pleura in the right middle lobe is smaller currently measuring 8 mm. No new nodular opacities are evident. There is underlying  emphysematous change with areas of fibrosis throughout portions of the upper and lower lobes bilaterally. Scattered areas of scarring are stable. No edema or airspace opacity. No pleural effusions.   Upper Abdomen: There is cholelithiasis. There is a cyst arising from the posterior mid right kidney measuring 5.9 x 4.4 cm. Visualized upper abdominal structures otherwise appear unremarkable.   Musculoskeletal: There is degenerative change in the thoracic spine. No blastic or lytic bone lesions. No chest wall lesions.   Review of the MIP images confirms the above findings.   IMPRESSION: 1. Stable prominence of the ascending thoracic aorta measuring 4.4 x 4.4 cm. Other measurements as noted. No dissection. There is aortic atherosclerosis as well as foci of great vessel and coronary artery calcification. Recommend annual imaging followup by CTA or MRA. This recommendation follows 2010 ACCF/AHA/AATS/ACR/ASA/SCA/SCAI/SIR/STS/SVM Guidelines for the Diagnosis and Management of Patients with Thoracic Aortic Disease. Circulation. 2010; 121: G401-U272. Aortic aneurysm NOS (ICD10-I71.9)   2.  No evident pulmonary embolus.   3. Underlying emphysematous change with areas of fibrosis and scarring, essentially stable. Nodular opacity right middle lobe smaller compared to previous study. This area may represent localized rounded atelectasis. No new nodular appearing opacities. No consolidation   4. Single mildly prominent aorta pulmonary window lymph node, likely of reactive etiology. No other enlarged lymph nodes by size criteria.   5.  Cholelithiasis.   Aortic Atherosclerosis (ICD10-I70.0).   Electronically Signed: By: Lowella Grip III M.D. On: 06/14/2021 08:54       Impression:  This 70 year old gentleman has stage D, severe, symptomatic aortic insufficiency with New York Heart Association class III symptoms of exertional fatigue and shortness of breath consistent with chronic  diastolic congestive heart failure.  He also has history of paroxysmal atrial fibrillation with multiple cardioversions in the past and currently maintained on Tikosyn and Eliquis for anticoagulation.  He has a trileaflet aortic valve with known mild enlargement of the ascending aorta measured at 4.4 cm on recent CTA of the chest which has been unchanged dating back to 2019.  I have personally reviewed his CTA of the chest, echocardiogram, and cardiac catheterization.  His descending aorta at the same level of his mid ascending aorta measures 3.3 cm and he is 6 foot 1 and 300 pounds which makes his 4.4 cm ascending aorta less significant.  His cardiac catheterization does not show any significant coronary artery disease with a patent stent in the LAD.  I think the best treatment for this gentleman is aortic valve replacement using a bioprosthetic valve and biatrial Maze procedure.  I do not think his aorta warrants replacement at his age and would significantly increase the complexity and risk of surgery.  His oxygen saturation on room air today was 82%.  His arterial blood gas during catheterization showed a PO2 of 66 with a sat of 91%.  Chest CT does show underlying emphysematous  change with areas of fibrosis and scarring that has been stable.  Previous pulmonary function testing in March 2019  showed no significant obstructive disease but did show a severe defect in diffusion capacity at 46% of predicted.  He has an appointment later today with pulmonary medicine.  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  I think he would probably qualify for home oxygen and will likely need oxygen after surgery. I discussed the operative procedure with the patient including alternatives, benefits and risks; including but not limited to bleeding, blood transfusion, infection, stroke, myocardial infarction, graft failure, heart  block requiring a permanent pacemaker, organ dysfunction, and death.  [name] [name]. understands and agrees to proceed.      Plan:  He will be scheduled for aortic valve replacement and Maze procedure on 09/14/2021.  I spent 60 minutes performing this consultation and > 50% of this time was spent face to face counseling and coordinating the care of this patient's severe symptomatic aortic insufficiency and paroxysmal atrial fibrillation.   [name], MD [facility] [phone]

## 2021-08-03 NOTE — Assessment & Plan Note (Signed)
-   Oxygen desaturated to 85% RA with ambulation, requiring 2L oxygen at rest and 3-4L with exertion to maintain O2 >88-90%.  - Placed DME order for new oxygen start. He was sent home with a loaner portable oxygen concentrator from [employer] today and they will be delivering oxygen supplies likely on Monday 08/06/21

## 2021-08-03 NOTE — Assessment & Plan Note (Addendum)
-   Patient reports 100% compliance with CPAP. He reports feeling "smothered" at times while wearing his CPAP. He relates this to anxiety and underlying cardiac disease. There is no Financial trader available d/t 3G being shut off. He will bring in Branch card for compliance check. Current pressure is 5-20cm h20. He may potentially need pressure adjusted or CPAP titration study at some point. Recommend following up with Dr. Halford Chessman 1 month after surgery.

## 2021-08-03 NOTE — Assessment & Plan Note (Signed)
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 He may require aortic root repair at time of AVR

## 2021-08-03 NOTE — Progress Notes (Signed)
@Patient  ID: [name] [name]., male    DOB: 1951-11-24, 70 y.o.   MRN: [id number]  Chief Complaint  Patient presents with   Follow-up    Having cardiac valve replacement in 1 month, walked and needs 4L to stay above 88%    Referring provider: Burnis Medin, MD  HPI: 70 year olf male, former smoker quit in 2010. PMH significant for centrilobular emphysema, OSA, pulmonary HTN, CAD s/p stent LAD, aortic valve insufficiency, afib, MI, thoracic aortic aneurysm without rupture, type 2 diabetes. Patient of Dr. Halford Chessman, last seen on 11/27/20.  08/03/2021 Patient presents today for evaluation low oxygen levels. He had CTA in June was negative for PE, underlying emphysematous changes with areas of fibrosis and scarring , nodular opacity RML smaller than previous, stable prominence ascending thoracic aorta measuring 4.4cm. He had a TEE and cardiac catheterization that showed moderate hypertension. He is scheduled for AV replacement in September 2022 with Dr. [name].  He reports compliance with CPAP use, no current [employer] available d/t recent 3G shutoff. He sometimes feels he is smothering when wearing his CPAP. He sleeps with wedge pillow. He does have some underlying anxiety. He takes his Anoro every day as prescribed. Denies chest tightness, wheezing or cough. O2 desaturated 85% RA today on walk test, requiring 2L at rest and 3-4L with exertion to maintain >88-90%.   Airview download 03/14/21-04/12/21 30/30 days used; 83%> 4 hours Average usage days used 5 hours 19 mins  Pressure 5-20cm h20 [place]aks 26L/min (95%) AHI 13.1    Imaging:  CTA>> June 2022 - nodular opacity right middle lobe is smaller currently measuring 26m, underlying emphysematous change with areas of fibrosis throughout portions upper and lower lobes bilaterally, 4.4 x 4.4 cm aortic aneurysm, enlargement of main pulmonary outflow tract measuring 4.6cm, indicative of underlying pulmonary arterial hypertension.    Cardiac: Echocardiogram May 2022 showed normal LV function, grade 1 diastolic dysfunction, moderate left atrial enlargement, mild right atrial enlargement, moderate to severe aortic insufficiency, mild aortic stenosis, mildly dilated ascending aorta at 43 mm.  TEE July 2022 showed normal LV function, moderate left ventricular enlargement, mild right ventricular enlargement, moderate left atrial enlargement, mild right atrial enlargement, mild mitral regurgitation, severe aortic insufficiency and dilated ascending aorta at 43 mm  Sleep test: PSG 12/26/02 >> AHI 25 Auto CPAP 10/27/20 to 11/25/20 >> used on 28 of 30 nights with average 4 hrs 21 min.  Average AHI 18.4 with median CPAP 14 and 95 th percentile CPAP 17 cm H2O  Pulmonary testing: PFT 03/25/18- FVC 3.62 (77%), FEV1 2.93 (85%), ratio 81, DLCOunc 15.63 (46%)  Allergies  Allergen Reactions   Pseudoephedrine Other (See Comments)    Patient went into afib   Diltiazem Rash and Itching    Also 2019   Novocain [Procaine] Hives    Dentist appointment in 1958; since then has tolerated lidocaine and provocaine with no hives or difficulty.   Quinolones     Patient was warned about not using Cipro and similar antibiotics. Recent studies have raised concern that fluoroquinolone antibiotics could be associated with an increased risk of aortic aneurysm Fluoroquinolones have non-antimicrobial properties that might jeopardise the integrity of the extracellular matrix of the vascular wall In a  propensity score matched cohort study in [place] there was a 66% increased rate of aortic aneurysm or dissection associated with oral fluoroquinolone use, compared wit   Sulfonamide Derivatives Other (See Comments)    Childhood reaction     Immunization History  Administered Date(s) Administered   Fluad Quad(high Dose 65+) 10/01/2019, 10/06/2020   Influenza Split 10/09/2011, 10/05/2012   Influenza Whole 10/24/2009, 10/25/2010   Influenza, High Dose  Seasonal PF 08/31/2015, 09/30/2016, 09/30/2017, 10/19/2018   Influenza,inj,Quad PF,6+ Mos 10/26/2013, 11/08/2014   PFIZER Comirnaty(Gray Top)Covid-19 Tri-Sucrose Vaccine 07/25/2021   PFIZER(Purple Top)SARS-COV-2 Vaccination 02/05/2020, 03/01/2020, 10/28/2020   Pneumococcal Conjugate-13 05/09/2015   Pneumococcal Polysaccharide-23 04/24/2009, 06/25/2016   Td 03/22/2009   Tdap 04/06/2015   Zoster Recombinat (Shingrix) 08/04/2020   Zoster, Live 04/29/2012    Past Medical History:  Diagnosis Date   Atrial fibrillation ([place]) 03/22/2009   a. s/p multiple DCCV; b. no coumadin due to low TE risk profile; c. Tikosyn Rx   Coronary atherosclerosis of native coronary artery 11/2002   a. s/p stent to LAD 12/03; OM2 occluded at cath 12/03; d. myoview 5/10: no ischemia;  e. echo 7/11: EF 55%, BAE, mild RVE, PASP 41-45; Myoview was in March 2013. 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[place])    2.6 to be evaluated incidental finding on CT   LATERAL EPICONDYLITIS, LEFT 10/24/2009   LIVER FUNCTION TESTS, ABNORMAL 04/25/2010   Local reaction to immunization 05/05/2012   minor resolving  zostavax    Myocardial infarction [facility]) [id number]   Numbness in left leg    foot related to back disease and surgery   Obesity, unspecified 04/24/2009   Perforated appendicitis with necrosis s/p open appendectomy 06/07/14 06/04/2014   Renal cyst    Characterized by MRI as simple   Ruptured suppurative appendicitis    2015    SLEEP APNEA, OBSTRUCTIVE 03/22/2009   compliant with CPAP   THROMBOCYTOPENIA 08/16/2010   TOBACCO USE, QUIT  10/24/2009   ULNAR NEUROPATHY, LEFT 06/20/2978   Umbilical hernia     Tobacco History: Social History   Tobacco Use  Smoking Status Former   Packs/day: 2.00   Years: 42.00   Pack years: 84.00   Types: Cigarettes   Quit date: 12/30/2008   Years since quitting: 12.6  Smokeless Tobacco Never  Tobacco Comments   started at age 60; 1-2 ppd; quit 2010   Counseling given: Yes Tobacco comments: started at age 70; 1-2 ppd; quit 2010   Outpatient Medications Prior to Visit  Medication Sig Dispense Refill   acetaminophen (TYLENOL) 325 MG tablet Take 650 mg by mouth every 6 (six) hours as needed for mild pain.     albuterol (PROVENTIL HFA;VENTOLIN HFA) 108 (90 Base) MCG/ACT inhaler Inhale 2 puffs into the lungs every 6 (six) hours as needed for wheezing or shortness of breath. 1 Inhaler 0   ALPRAZolam (XANAX) 0.5 MG tablet Take 1 tablet (0.5 mg total) by mouth 2 (two) times daily as needed for anxiety. 30 tablet 0   apixaban (ELIQUIS) 5 MG TABS tablet Take 1 tablet (5 mg total) by mouth 2 (two) times daily. 180 tablet 3   atorvastatin (LIPITOR) 80 MG tablet Take 1 tablet (80 mg total) by mouth daily.     Blood Glucose Monitoring Suppl (ONETOUCH VERIO) w/Device KIT  Use to test blood sugars 1-2 times daily. 1 kit 1   brimonidine (ALPHAGAN) 0.15 % ophthalmic solution Place 1 drop into both eyes 3 (three) times daily.     busPIRone (BUSPAR) 10 MG tablet Take 1 tablet (10 mg total) by mouth 2 (two) times daily. 180 tablet 1   busPIRone (BUSPAR) 15 MG tablet TAKE 1/3 TABLET TWICE A DAY FOR 1 WEEK, THEN 2/3 TAB TWICE DAILY X1 WEEK, THEN 1 TAB TWICE A DAY 30 tablet 1   Cholecalciferol (VITAMIN D) 50 MCG (2000 UT) tablet Take 200-4,000 Units by mouth See admin instructions. Take 2000 units by mouth in the morning and 4000 units in the evening     COVID-19 mRNA Vac-TriS, Pfizer, (PFIZER-BIONT COVID-19 VAC-TRIS) SUSP injection Inject into the muscle. 0.3 mL 0   diphenhydrAMINE (BENADRYL) 25 MG tablet Take  25-50 mg by mouth at bedtime as needed for sleep.      dofetilide (TIKOSYN) 500 MCG capsule Take 1 capsule (500 mcg total) by mouth 2 (two) times daily.     famotidine (PEPCID) 10 MG tablet Take 10 mg by mouth as needed for heartburn.      fexofenadine (ALLEGRA) 180 MG tablet Take 180 mg by mouth daily as needed for allergies.      fish oil-omega-3 fatty acids 1000 MG capsule Take 1 g by mouth 2 (two) times daily.     fluticasone (FLONASE) 50 MCG/ACT nasal spray Place 2 sprays into both nostrils daily.     hydrALAZINE (APRESOLINE) 100 MG tablet Take 1 tablet (100 mg total) by mouth 3 (three) times daily. 270 tablet 3   hydrALAZINE (APRESOLINE) 50 MG tablet Take 50 mg by mouth 2 (two) times daily.     Lancets (ONETOUCH ULTRASOFT) lancets Twice daily 100 each 6   losartan (COZAAR) 100 MG tablet Take 1 tablet (100 mg total) by mouth daily. 90 tablet 3   Magnesium 250 MG TABS Take 250-500 mg by mouth See admin instructions. Takes 250 mg by mouth in the morning and 500 mg in the evening     metFORMIN (GLUCOPHAGE-XR) 500 MG 24 hr tablet Take 1 tablet (500 mg total) by mouth in the morning, at noon, and at bedtime.     methocarbamol (ROBAXIN) 500 MG tablet TAKE 1 TABLET BY MOUTH 4 TIMES A DAY AS NEEDED FOR MUSCLE SPASM (Patient taking differently: Take 500 mg by mouth 4 (four) times daily as needed for muscle spasms.) 120 tablet 2   MULTIPLE VITAMIN PO Take 1 tablet by mouth every evening.     nitroGLYCERIN (NITROSTAT) 0.4 MG SL tablet Place 1 tablet (0.4 mg total) under the tongue every 5 (five) minutes as needed for chest pain. 25 tablet 12   ONETOUCH VERIO test strip USE AS DIRECTED TWICE A DAY 50 strip 12   pantoprazole (PROTONIX) 40 MG tablet Take 40 mg by mouth daily as needed (acid reflux).     PARoxetine (PAXIL) 20 MG tablet TAKE 2&1/2 TABLETS BY MOUTH DAILY 90 tablet 1   potassium chloride SA (KLOR-CON M20) 20 MEQ tablet Take 1 tablet (20 mEq total) by mouth daily.     tamsulosin (FLOMAX) 0.4 MG  CAPS capsule Take 1 capsule (0.4 mg total) by mouth daily as needed (repeat kidney stone).     traZODone (DESYREL) 50 MG tablet TAKE 1 TABLET BY MOUTH EVERYDAY AT BEDTIME 90 tablet 3   triamcinolone cream (KENALOG) 0.1 % Apply 1 application topically 2 (two) times daily as needed (irritation).  umeclidinium-vilanterol (ANORO ELLIPTA) 62.5-25 MCG/INH AEPB Inhale 1 puff into the lungs daily.     Facility-Administered Medications Prior to Visit  Medication Dose Route Frequency Provider Last Rate Last Admin   sodium chloride flush (NS) 0.9 % injection 3 mL  3 mL Intravenous Q12H Crenshaw, Denice Bors, MD       Review of Systems  Review of Systems  Constitutional: Negative.   HENT: Negative.    Respiratory:  Positive for shortness of breath. Negative for cough, chest tightness and wheezing.   Cardiovascular:  Positive for leg swelling.  Psychiatric/Behavioral:  The patient is nervous/anxious.     Physical Exam  BP (!) 142/58 (BP Location: Right Arm, Cuff Size: Normal)   Pulse 68   Ht 6' 1"  (1.854 m)   Wt 298 lb 6.4 oz (135.4 kg)   SpO2 91%   BMI 39.37 kg/m  Physical Exam Constitutional:      General: He is not in acute distress.    Appearance: Normal appearance. He is obese. He is not ill-appearing.  HENT:     Head: Normocephalic and atraumatic.     Mouth/Throat:     Mouth: Mucous membranes are moist.     Pharynx: Oropharynx is clear.  Cardiovascular:     Rate and Rhythm: Normal rate and regular rhythm.     Comments: + 1-2 BLE pitting edema  Pulmonary:     Effort: Pulmonary effort is normal.     Breath sounds: Normal breath sounds.     Comments: CTA, ?faint rales at lower bases; O2 85% RA requiring 2-4L  Neurological:     General: No focal deficit present.     Mental Status: He is alert and oriented to person, place, and time. Mental status is at baseline.  Psychiatric:        Mood and Affect: Mood normal.        Behavior: Behavior normal.        Thought Content: Thought  content normal.        Judgment: Judgment normal.     Lab Results:  CBC    Component Value Date/Time   WBC 6.2 07/13/2021 0801   RBC 4.44 07/13/2021 0801   HGB 12.9 (L) 07/16/2021 1016   HGB 13.7 07/12/2021 0823   HGB 16.3 03/12/2011 0948   HCT 38.0 (L) 07/16/2021 1016   HCT 42.5 07/12/2021 0823   HCT 46.6 03/12/2011 0948   PLT 161.0 07/13/2021 0801   PLT 179 07/12/2021 0823   MCV 89.4 07/13/2021 0801   MCV 90 07/12/2021 0823   MCV 90.9 03/12/2011 0948   MCH 29.1 07/12/2021 0823   MCH 32.0 09/30/2019 0410   MCHC 34.2 07/13/2021 0801   RDW 14.9 07/13/2021 0801   RDW 13.1 07/12/2021 0823   RDW 13.1 03/12/2011 0948   LYMPHSABS 1.6 07/13/2021 0801   LYMPHSABS 3.4 (H) 12/25/2017 0937   LYMPHSABS 2.7 03/12/2011 0948   MONOABS 0.6 07/13/2021 0801   MONOABS 0.6 03/12/2011 0948   EOSABS 0.4 07/13/2021 0801   EOSABS 0.6 (H) 12/25/2017 0937   BASOSABS 0.0 07/13/2021 0801   BASOSABS 0.0 12/25/2017 0937   BASOSABS 0.0 03/12/2011 0948    BMET    Component Value Date/Time   NA 138 07/19/2021 1407   K 4.7 07/19/2021 1407   CL 103 07/19/2021 1407   CO2 19 (L) 07/19/2021 1407   GLUCOSE 157 (H) 07/19/2021 1407   GLUCOSE 121 (H) 07/13/2021 0801   BUN 30 (H) 07/19/2021 1407  CREATININE 1.33 (H) 07/19/2021 1407   CREATININE 1.21 (H) 09/14/2020 0754   CALCIUM 9.3 07/19/2021 1407   GFRNONAA 55 (L) 12/04/2020 1444   GFRNONAA 60 09/14/2020 0754   GFRAA 64 12/04/2020 1444   GFRAA 70 09/14/2020 0754    BNP    Component Value Date/Time   BNP 42.0 03/01/2017 0308    ProBNP    Component Value Date/Time   PROBNP 47.0 07/21/2010 1645    Imaging: CARDIAC CATHETERIZATION  Result Date: 07/16/2021 Formatting of this result is different from the original.   Previously placed Mid LAD stent (unknown type) is  widely patent.   LV end diastolic pressure is mildly elevated.   Hemodynamic findings consistent with moderate pulmonary hypertension.   There is no aortic valve stenosis.    Mild, diffuse nonobstructive CAD   On 4L [place], Ao sat 91%, PA sat 73%, PA pressure 67/20, mean PA 39 mm Hg; mean PCWP 17 mm Hg; CO 10.1 L/min; CI 4. Due to dilated aortic root, JL 5  for left and AL2 for RCA may be the best catheters.  Continue with plans for surgical evaluation for AVR.   ECHO TEE  Result Date: 07/10/2021    TRANSESOPHOGEAL ECHO REPORT   Patient Name:   Harim Bi. Date of Exam: 07/10/2021 Medical Rec #:  [id number]          Height:       73.0 in Accession #:    [id number]         Weight:       297.8 lb Date of Birth:  [birth year]         BSA:          2.548 m Patient Age:    55 years           BP:           115/101 mmHg Patient Gender: M                  HR:           52 bpm. Exam Location:  Outpatient Procedure: 3D Echo, Cardiac Doppler, Color Doppler and Transesophageal Echo Indications:     I35.1 Nonrheumatic aortic (valve) insufficiency  History:         Patient has prior history of Echocardiogram examinations, most                  recent 06/06/2016. Previous Myocardial Infarction and CAD,                  Arrythmias:Atrial Fibrillation; Risk Factors:Dyslipidemia,                  Former Smoker and Diabetes. Aneurysm.  Sonographer:     Jonelle Sidle Dance Referring Phys:  [place] Diagnosing Phys: [name] MD PROCEDURE: The transesophogeal probe was passed without difficulty through the esophogus of the patient. Sedation performed by different physician. The patient was monitored while under deep sedation. Anesthestetic sedation was provided intravenously by Anesthesiology: 344.3m of Propofol, 1010mof Lidocaine. The patient developed no complications during the procedure. IMPRESSIONS  1. Dilated ascending aorta; severe, cental aortic insufficiency.  2. Left ventricular ejection fraction, by estimation, is 55 to 60%. The left ventricle has normal function. The left ventricular internal cavity size was moderately dilated.  3. Right ventricular systolic function is normal.  The right ventricular size is mildly enlarged.  4. Left atrial size was moderately dilated. No left atrial/left  atrial appendage thrombus was detected.  5. Right atrial size was mildly dilated.  6. The mitral valve is normal in structure. Mild mitral valve regurgitation.  7. The aortic valve is tricuspid. Aortic valve regurgitation is severe.  8. Aortic dilatation noted. There is mild dilatation of the ascending aorta, measuring 43 mm. There is mild (Grade II) plaque involving the descending aorta. FINDINGS  Left Ventricle: Left ventricular ejection fraction, by estimation, is 55 to 60%. The left ventricle has normal function. The left ventricular internal cavity size was moderately dilated. Right Ventricle: The right ventricular size is mildly enlarged. Right vetricular wall thickness was not assessed. Right ventricular systolic function is normal. Left Atrium: Left atrial size was moderately dilated. No left atrial/left atrial appendage thrombus was detected. Right Atrium: Right atrial size was mildly dilated. Pericardium: There is no evidence of pericardial effusion. Mitral Valve: The mitral valve is normal in structure. Mild mitral valve regurgitation. Tricuspid Valve: The tricuspid valve is normal in structure. Tricuspid valve regurgitation is mild. Aortic Valve: The aortic valve is tricuspid. Aortic valve regurgitation is severe. Aortic regurgitation PHT measures 485 msec. Pulmonic Valve: The pulmonic valve was normal in structure. Pulmonic valve regurgitation is mild. Aorta: Aortic dilatation noted. There is mild dilatation of the ascending aorta, measuring 43 mm. There is mild (Grade II) plaque involving the descending aorta. IAS/Shunts: The interatrial septum is aneurysmal. No atrial level shunt detected by color flow Doppler. Additional Comments: Dilated ascending aorta; severe, cental aortic insufficiency.  AORTIC VALVE AI PHT:      485 msec  AORTA Ao Root diam: 3.60 cm Ao Asc diam:  4.20 cm [name] MD Electronically signed by [name] MD Signature Date/Time: 07/10/2021/10:18:37 AM    Final      Assessment & Plan:   Dyspnea on exertion - Multifactorial d/t pulmonary HTN, moderate-severe aortic insufficiency and COPD  COPD (chronic obstructive pulmonary disease) ([place]) - PFTs in 2019 showed normal spirometry with severe diffusion defect. COPD dose not appear acutely exacerbated. He denies chest tightness, wheezing or cough. Continue Anoro Ellipta one puff daily in the morning and prn Albuterol hfa q 6 hours for shortness of breath/wheezing. Recommend repeating pulmonary function testing to assess lung function   Obstructive sleep apnea - Patient reports 100% compliance with CPAP. He reports feeling "smothered" at times while wearing his CPAP. He relates this to anxiety and underlying cardiac disease. There is no [employer] available d/t 3G being shut off. He will bring in [place] card for compliance check. Current pressure is 5-20cm h20. He may potentially need pressure adjusted or CPAP titration study at some point. Recommend following up with Dr. Halford Chessman 1 month after surgery.   Nonrheumatic aortic valve insufficiency -  Scheduled for Aortic valve replacement, possible aortic root replacement and CABG if indicated with Dr. Lynnette Caffey in September 2022  Atrial fibrillation ([place]) - Appears to be in regular rhythm on exam today  - Continue Tikosyn and Eliquis   Thoracic aortic aneurysm without rupture ([place]) -  Mildly dilated ascending aorta at 43 mm on Echocardiogram in May 2022. He may require aortic root repair at time of AVR   Pulmonary HTN ([place]) - Secondary to COPD and OSA  Respiratory failure with hypoxia ([place]) - Oxygen desaturated to 85% RA with ambulation, requiring 2L oxygen at rest and 3-4L with exertion to maintain O2 >88-90%.  - Placed DME order for new oxygen start. He was sent home with a loaner portable oxygen concentrator from [employer] today and  they will be  delivering oxygen supplies likely on Monday 08/06/21    Martyn Ehrich, NP 08/03/2021

## 2021-08-03 NOTE — Patient Instructions (Addendum)
  Nice seeing you today Mr Masoud   OSA: - Continue to wear CPAP every night for minimum 4-6 hours or longer each night - Resmed airview for your CPAP stopped in March due 3G shut off, you will need to bring your machine to [employer] to have them download a compliance report for us/they should also provide you with an SD card if needed   COPD: - Continue Anoro Ellipta - take one puff a day - Continue Ventolin rescue inhaler 2 puffs every 6 hour as needed for shortness of breath/wheezing - Needs PFTs (last done in 2019)  Acute/chronic respiratory failure: - Wear 2L at rest; 3-4L oxygen on exertion to keep > 88-90%   Orders: - PFTs re: COPD  - New oxygen start - Needs adapter oxygen for CPAP   Follow-up: - Please scheduled patient for PFTs (first available-urgent) - Call and schedule follow-up with Dr. Halford Chessman 1 month after cardiac surgery

## 2021-08-06 ENCOUNTER — Encounter: Payer: Self-pay | Admitting: *Deleted

## 2021-08-06 NOTE — Progress Notes (Signed)
Reviewed and agree with assessment/plan.   Chesley Mires, MD [facility] Pulmonary/Critical Care 08/06/2021, 8:50 AM Pager:  [phone]

## 2021-08-07 NOTE — Telephone Encounter (Signed)
Patient is scheduled for PFT in 8/1//22 but not scheduled for OV will call tomorrow

## 2021-08-09 ENCOUNTER — Other Ambulatory Visit: Payer: Self-pay

## 2021-08-09 ENCOUNTER — Ambulatory Visit (INDEPENDENT_AMBULATORY_CARE_PROVIDER_SITE_OTHER): Payer: Medicare Other | Admitting: Pulmonary Disease

## 2021-08-09 DIAGNOSIS — R0602 Shortness of breath: Secondary | ICD-10-CM

## 2021-08-09 DIAGNOSIS — J432 Centrilobular emphysema: Secondary | ICD-10-CM

## 2021-08-09 LAB — PULMONARY FUNCTION TEST
DL/VA % pred: 47 %
DL/VA: 1.89 ml/min/mmHg/L
DLCO cor % pred: 38 %
DLCO cor: 10.29 ml/min/mmHg
DLCO unc % pred: 38 %
DLCO unc: 10.29 ml/min/mmHg
FEF 25-75 Post: 2.55 L/sec
FEF 25-75 Pre: 2.08 L/sec
FEF2575-%Change-Post: 23 %
FEF2575-%Pred-Post: 101 %
FEF2575-%Pred-Pre: 82 %
FEV1-%Change-Post: 5 %
FEV1-%Pred-Post: 77 %
FEV1-%Pred-Pre: 74 %
FEV1-Post: 2.6 L
FEV1-Pre: 2.48 L
FEV1FVC-%Change-Post: -1 %
FEV1FVC-%Pred-Pre: 105 %
FEV6-%Change-Post: 5 %
FEV6-%Pred-Post: 78 %
FEV6-%Pred-Pre: 73 %
FEV6-Post: 3.36 L
FEV6-Pre: 3.17 L
FEV6FVC-%Change-Post: -1 %
FEV6FVC-%Pred-Post: 104 %
FEV6FVC-%Pred-Pre: 105 %
FVC-%Change-Post: 7 %
FVC-%Pred-Post: 75 %
FVC-%Pred-Pre: 70 %
FVC-Post: 3.42 L
FVC-Pre: 3.2 L
Post FEV1/FVC ratio: 76 %
Post FEV6/FVC ratio: 98 %
Pre FEV1/FVC ratio: 77 %
Pre FEV6/FVC Ratio: 99 %
RV % pred: 95 %
RV: 2.42 L
TLC % pred: 81 %
TLC: 5.86 L

## 2021-08-09 NOTE — Progress Notes (Signed)
PFT done today. 

## 2021-08-09 NOTE — Telephone Encounter (Signed)
ATC patient to get him scheduled for OV, LMTCB. Patient also has an appt for PFT today so will send message to front staff to get him scheduled for OV when he comes in today.

## 2021-08-10 ENCOUNTER — Encounter: Payer: Self-pay | Admitting: Primary Care

## 2021-08-10 ENCOUNTER — Ambulatory Visit (INDEPENDENT_AMBULATORY_CARE_PROVIDER_SITE_OTHER): Payer: Medicare Other | Admitting: Primary Care

## 2021-08-10 VITALS — BP 156/66 | HR 60 | Temp 98.0°F | Ht 73.0 in | Wt 303.8 lb

## 2021-08-10 DIAGNOSIS — I351 Nonrheumatic aortic (valve) insufficiency: Secondary | ICD-10-CM

## 2021-08-10 DIAGNOSIS — G4733 Obstructive sleep apnea (adult) (pediatric): Secondary | ICD-10-CM

## 2021-08-10 DIAGNOSIS — J449 Chronic obstructive pulmonary disease, unspecified: Secondary | ICD-10-CM

## 2021-08-10 NOTE — Patient Instructions (Addendum)
Pulmonary fuctoin testing looks stable, diffusion capacity is some worse d/t emphysema/ fibrosis or scarring  No changes at this time to your CPAP, I will review with Dr. Halford Chessman or Annamaria Boots.  Likely will recommend you go back to the lab for CPAP/BIPAP titration  Follow up: - 2 months with Dr. Halford Chessman OR sooner if needed

## 2021-08-10 NOTE — Progress Notes (Signed)
@Patient  ID: [name] Chimes., male    DOB: 04/29/1951, 70 y.o.   MRN: [id number]  No chief complaint on file.   Referring provider: Burnis Medin, MD  HPI: 72 year olf male, former smoker quit in 2010. PMH significant for centrilobular emphysema, OSA, pulmonary HTN, CAD s/p stent LAD, aortic valve insufficiency, afib, MI, thoracic aortic aneurysm without rupture, type 2 diabetes. Patient of Dr. Halford Chessman, last seen on 11/27/20.  Previous [facility] pulmonary encounter: 08/03/2021 Patient presents today for evaluation low oxygen levels. He had CTA in June was negative for PE, underlying emphysematous changes with areas of fibrosis and scarring , nodular opacity RML smaller than previous, stable prominence ascending thoracic aorta measuring 4.4cm. He had a TEE and cardiac catheterization that showed moderate hypertension. He is scheduled for AV replacement in September 2022 with Dr. [name].  He reports compliance with CPAP use, no current [employer] available d/t recent 3G shutoff. He sometimes feels he is smothering when wearing his CPAP. He sleeps with wedge pillow. He does have some underlying anxiety. He takes his Anoro every day as prescribed. Denies chest tightness, wheezing or cough. O2 desaturated 85% RA today on walk test, requiring 2L at rest and 3-4L with exertion to maintain >88-90%.   Airview download 03/14/21-04/12/21 30/30 days used; 83%> 4 hours Average usage days used 5 hours 19 mins  Pressure 5-20cm h20 Airleaks 26L/min (95%) AHI 13.1   08/10/2021- interim hx  Patient presents today to review pulmonary function testing. He also brought in his SD card for compliance check today.  He is still having some events, mostly central apneas. Recommend holding off on titration study.  Re-evaluate after cardiac surgery.  Airview download 07/11/21-08/09/21 28/30 days used (93%); 21 days used (70%) Pressure- 10-20cm h20 (14.9cm h20-95%) Airleaks- 19.6L/min (95%) Events per hour - AI 6.4;  HI 7.8 Apnea index- Central 4.4; Obstructive 1.2 AHI 14.2   Imaging:  CTA>> June 2022 - nodular opacity right middle lobe is smaller currently measuring 25m, underlying emphysematous change with areas of fibrosis throughout portions upper and lower lobes bilaterally, 4.4 x 4.4 cm aortic aneurysm, enlargement of main pulmonary outflow tract measuring 4.6cm, indicative of underlying pulmonary arterial hypertension.   Cardiac: Echocardiogram May 2022 showed normal LV function, grade 1 diastolic dysfunction, moderate left atrial enlargement, mild right atrial enlargement, moderate to severe aortic insufficiency, mild aortic stenosis, mildly dilated ascending aorta at 43 mm.  TEE July 2022 showed normal LV function, moderate left ventricular enlargement, mild right ventricular enlargement, moderate left atrial enlargement, mild right atrial enlargement, mild mitral regurgitation, severe aortic insufficiency and dilated ascending aorta at 43 mm  Sleep test: PSG 12/26/02 >> AHI 25 Auto CPAP 10/27/20 to 11/25/20 >> used on 28 of 30 nights with average 4 hrs 21 min.  Average AHI 18.4 with median CPAP 14 and 95 th percentile CPAP 17 cm H2O  Pulmonary testing: PFT 03/25/18- FVC 3.62 (77%), FEV1 2.93 (85%), ratio 81, DLCOunc 15.63 (46%) PFT 08/09/21- FVC 3.42 (75%), FEV1 2. 60 (77%), ratio 76, DLCOunc 10/29 (38%)   Allergies  Allergen Reactions   Pseudoephedrine Other (See Comments)    Patient went into afib   Diltiazem Rash and Itching    Also 2019   Novocain [Procaine] Hives    Dentist appointment in 1958; since then has tolerated lidocaine and provocaine with no hives or difficulty.   Quinolones     Patient was warned about not using Cipro and similar antibiotics. Recent studies have raised  concern that fluoroquinolone antibiotics could be associated with an increased risk of aortic aneurysm Fluoroquinolones have non-antimicrobial properties that might jeopardise the integrity of the  extracellular matrix of the vascular wall In a  propensity score matched cohort study in [place], there was a 66% increased rate of aortic aneurysm or dissection associated with oral fluoroquinolone use, compared wit   Sulfonamide Derivatives Other (See Comments)    Childhood reaction     Immunization History  Administered Date(s) Administered   Fluad Quad(high Dose 65+) 10/01/2019, 10/06/2020   Influenza Split 10/09/2011, 10/05/2012   Influenza Whole 10/24/2009, 10/25/2010   Influenza, High Dose Seasonal PF 08/31/2015, 09/30/2016, 09/30/2017, 10/19/2018   Influenza,inj,Quad PF,6+ Mos 10/26/2013, 11/08/2014   PFIZER Comirnaty(Gray Top)Covid-19 Tri-Sucrose Vaccine 07/25/2021   PFIZER(Purple Top)SARS-COV-2 Vaccination 02/05/2020, 03/01/2020, 10/28/2020   Pneumococcal Conjugate-13 05/09/2015   Pneumococcal Polysaccharide-23 04/24/2009, 06/25/2016   Td 03/22/2009   Tdap 04/06/2015   Zoster Recombinat (Shingrix) 08/04/2020   Zoster, Live 04/29/2012    Past Medical History:  Diagnosis Date   Anxiety    Atrial fibrillation ([place]) 03/22/2009   a. s/p multiple DCCV; b. no coumadin due to low TE risk profile; c. Tikosyn Rx   COPD (chronic obstructive pulmonary disease) ([place])    Coronary atherosclerosis of native coronary artery 11/2002   a. s/p stent to LAD 12/03; OM2 occluded at cath 12/03; d. myoview 5/10: no ischemia;  e. echo 7/11: EF 55%, BAE, mild RVE, PASP 41-45; Myoview was in March 2013. 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[facility])    2.6 to be evaluated incidental finding on CT   LATERAL EPICONDYLITIS, LEFT 10/24/2009   LIVER FUNCTION TESTS, ABNORMAL 04/25/2010   Local reaction to immunization 05/05/2012   minor resolving  zostavax    Myocardial infarction [facility]) mi2003   Numbness in left leg    foot related to back disease and surgery   Obesity, unspecified 04/24/2009   Perforated appendicitis with necrosis s/p open appendectomy 06/07/14 06/04/2014   Renal cyst    Characterized by MRI as simple   Ruptured suppurative appendicitis    2015    SLEEP APNEA, OBSTRUCTIVE 03/22/2009   compliant with CPAP   THROMBOCYTOPENIA 08/16/2010   TOBACCO USE, QUIT 10/24/2009   ULNAR NEUROPATHY, LEFT 45/40/9811   Umbilical hernia     Tobacco History: Social History   Tobacco Use  Smoking Status Former   Packs/day: 2.00   Years: 42.00   Pack years: 84.00   Types: Cigarettes   Quit date: 12/30/2008   Years since quitting: 12.7  Smokeless Tobacco Never  Tobacco Comments   started at age 13; 1-2 ppd; quit 2010   Counseling given: Not Answered Tobacco comments: started at age 81; 1-2 ppd; quit 2010   Outpatient Medications Prior to Visit  Medication Sig Dispense Refill   acetaminophen (TYLENOL) 325 MG tablet Take 650 mg by mouth every 6 (six) hours as needed for mild pain.  albuterol (PROVENTIL HFA;VENTOLIN HFA) 108 (90 Base) MCG/ACT inhaler Inhale 2 puffs into the lungs every 6 (six) hours as needed for wheezing or shortness of breath. 1 Inhaler 0   atorvastatin (LIPITOR) 80 MG tablet Take 1 tablet (80 mg total) by mouth daily.     Blood Glucose Monitoring Suppl (ONETOUCH VERIO) w/Device KIT Use to test blood sugars 1-2 times daily. 1 kit 1   brimonidine (ALPHAGAN) 0.15 % ophthalmic solution Place 1 drop into both eyes 3 (three) times daily.     busPIRone (BUSPAR) 10 MG tablet Take 1 tablet (10 mg total) by mouth 2 (two) times daily. 180 tablet 1   Cholecalciferol (VITAMIN D) 50 MCG (2000 UT) tablet Take  2,000-4,000 Units by mouth See admin instructions. Take 2000 units by mouth in the morning and 4000 units in the evening     COVID-19 mRNA Vac-TriS, Pfizer, (PFIZER-BIONT COVID-19 VAC-TRIS) SUSP injection Inject into the muscle. 0.3 mL 0   diphenhydrAMINE (BENADRYL) 25 MG tablet Take 25-50 mg by mouth at bedtime as needed for sleep.      dofetilide (TIKOSYN) 500 MCG capsule Take 1 capsule (500 mcg total) by mouth 2 (two) times daily.     famotidine (PEPCID) 10 MG tablet Take 10 mg by mouth daily as needed for heartburn.     fexofenadine (ALLEGRA) 180 MG tablet Take 180 mg by mouth daily as needed for allergies.      fish oil-omega-3 fatty acids 1000 MG capsule Take 1 g by mouth 2 (two) times daily.     fluticasone (FLONASE) 50 MCG/ACT nasal spray Place 2 sprays into both nostrils daily.     hydrALAZINE (APRESOLINE) 100 MG tablet Take 1 tablet (100 mg total) by mouth 3 (three) times daily. 270 tablet 3   Lancets (ONETOUCH ULTRASOFT) lancets Twice daily 100 each 6   losartan (COZAAR) 100 MG tablet Take 1 tablet (100 mg total) by mouth daily. 90 tablet 3   Magnesium 250 MG TABS Take 250-500 mg by mouth See admin instructions. Takes 250 mg by mouth in the morning and 500 mg in the evening     metFORMIN (GLUCOPHAGE-XR) 500 MG 24 hr tablet Take 1 tablet (500 mg total) by mouth in the morning, at noon, and at bedtime.     methocarbamol (ROBAXIN) 500 MG tablet TAKE 1 TABLET BY MOUTH 4 TIMES A DAY AS NEEDED FOR MUSCLE SPASM (Patient taking differently: Take 500 mg by mouth 4 (four) times daily as needed for muscle spasms.) 120 tablet 2   MULTIPLE VITAMIN PO Take 1 tablet by mouth every evening.     nitroGLYCERIN (NITROSTAT) 0.4 MG SL tablet Place 1 tablet (0.4 mg total) under the tongue every 5 (five) minutes as needed for chest pain. 25 tablet 12   ONETOUCH VERIO test strip USE AS DIRECTED TWICE A DAY 50 strip 12   pantoprazole (PROTONIX) 40 MG tablet Take 40 mg by mouth daily as needed (acid reflux).      PARoxetine (PAXIL) 20 MG tablet TAKE 2&1/2 TABLETS BY MOUTH DAILY (Patient taking differently: Take 60 mg by mouth daily.) 90 tablet 1   tamsulosin (FLOMAX) 0.4 MG CAPS capsule Take 1 capsule (0.4 mg total) by mouth daily as needed (repeat kidney stone).     traZODone (DESYREL) 50 MG tablet TAKE 1 TABLET BY MOUTH EVERYDAY AT BEDTIME (Patient taking differently: Take 50 mg by mouth at bedtime.) 90 tablet 3   triamcinolone cream (KENALOG) 0.1 % Apply 1 application topically 2 (two)  times daily as needed (irritation).     umeclidinium-vilanterol (ANORO ELLIPTA) 62.5-25 MCG/INH AEPB Inhale 1 puff into the lungs daily.     ALPRAZolam (XANAX) 0.5 MG tablet Take 1 tablet (0.5 mg total) by mouth 2 (two) times daily as needed for anxiety. 30 tablet 0   apixaban (ELIQUIS) 5 MG TABS tablet Take 1 tablet (5 mg total) by mouth 2 (two) times daily. 180 tablet 3   busPIRone (BUSPAR) 15 MG tablet TAKE 1/3 TABLET TWICE A DAY FOR 1 WEEK, THEN 2/3 TAB TWICE DAILY X1 WEEK, THEN 1 TAB TWICE A DAY (Patient not taking: Reported on 08/28/2021) 30 tablet 1   hydrALAZINE (APRESOLINE) 50 MG tablet Take 50 mg by mouth 2 (two) times daily.     potassium chloride SA (KLOR-CON M20) 20 MEQ tablet Take 1 tablet (20 mEq total) by mouth daily.     Facility-Administered Medications Prior to Visit  Medication Dose Route Frequency Provider Last Rate Last Admin   sodium chloride flush (NS) 0.9 % injection 3 mL  3 mL Intravenous Q12H Crenshaw, Denice Bors, MD          Review of Systems  Review of Systems  Constitutional: Negative.   HENT: Negative.    Respiratory: Negative.      Physical Exam  BP (!) 156/66 (BP Location: Left Arm, Patient Position: Sitting, Cuff Size: Normal)   Pulse 60   Temp 98 F (36.7 C) (Oral)   Ht 6' 1"  (1.854 m)   Wt (!) 303 lb 12.8 oz (137.8 kg)   SpO2 94%   BMI 40.08 kg/m  Physical Exam Constitutional:      Appearance: Normal appearance. He is obese.  HENT:     Head: Normocephalic and  atraumatic.  Cardiovascular:     Rate and Rhythm: Normal rate.  Pulmonary:     Effort: Pulmonary effort is normal.     Breath sounds: Rales present. No wheezing or rhonchi.  Musculoskeletal:        General: Normal range of motion.  Skin:    General: Skin is warm and dry.  Neurological:     General: No focal deficit present.     Mental Status: He is alert and oriented to person, place, and time. Mental status is at baseline.  Psychiatric:        Mood and Affect: Mood normal.        Behavior: Behavior normal.        Thought Content: Thought content normal.        Judgment: Judgment normal.     Lab Results:  CBC    Component Value Date/Time   WBC 12.8 (H) Sep 23, 2021 0342   RBC 3.37 (L) Sep 23, 2021 0342   HGB 9.9 (L) 09/23/21 0342   HGB 13.7 07/12/2021 0823   HGB 16.3 03/12/2011 0948   HCT 33.1 (L) 09-23-21 0342   HCT 42.5 07/12/2021 0823   HCT 46.6 03/12/2011 0948   PLT 212 2021-09-23 0342   PLT 179 07/12/2021 0823   MCV 98.2 Sep 23, 2021 0342   MCV 90 07/12/2021 0823   MCV 90.9 03/12/2011 0948   MCH 29.4 2021-09-23 0342   MCHC 29.9 (L) 09/23/21 0342   RDW 15.9 (H) 09-23-2021 0342   RDW 13.1 07/12/2021 0823   RDW 13.1 03/12/2011 0948   LYMPHSABS 1.1 09/14/2021 0311   LYMPHSABS 3.4 (H) 12/25/2017 0937   LYMPHSABS 2.7 03/12/2011 0948   MONOABS 1.1 (H) 09/14/2021 0311   MONOABS 0.6 03/12/2011 0948   EOSABS  0.7 (H) 09/14/2021 0311   EOSABS 0.6 (H) 12/25/2017 0937   BASOSABS 0.0 09/14/2021 0311   BASOSABS 0.0 12/25/2017 0937   BASOSABS 0.0 03/12/2011 0948    BMET    Component Value Date/Time   NA 152 (H) 10/11/21 0342   NA 138 07/19/2021 1407   K 3.9 10-11-21 0342   CL 113 (H) 10/11/2021 0342   CO2 33 (H) Oct 11, 2021 0342   GLUCOSE 192 (H) October 11, 2021 0342   BUN 100 (H) 10/11/2021 0342   BUN 30 (H) 07/19/2021 1407   CREATININE 1.86 (H) Oct 11, 2021 0342   CREATININE 1.21 (H) 09/14/2020 0754   CALCIUM 8.7 (L) 10-11-2021 0342   GFRNONAA 38 (L) 10/11/21  0342   GFRNONAA 60 09/14/2020 0754   GFRAA 64 12/04/2020 1444   GFRAA 70 09/14/2020 0754    BNP    Component Value Date/Time   BNP 772.9 (H) 10-11-21 0342    ProBNP    Component Value Date/Time   PROBNP 47.0 07/21/2010 1645    Imaging: DG Chest 1 View  Result Date: 09/20/2021 CLINICAL DATA:  Respiratory distress. EXAM: CHEST  1 VIEW COMPARISON:  September 19, 2021. FINDINGS: Stable cardiomegaly. Status post aortic valve repair. Left internal jugular catheter is unchanged in position. Feeding tube is unchanged. Bilateral perihilar and basilar opacities are noted concerning for edema. Visualized lung bases are not completely included in the field-of-view. Visualized bony thorax is unremarkable. IMPRESSION: Stable support apparatus. Stable bilateral lung opacities are noted concerning for edema. Electronically Signed   By: Marijo Conception M.D.   On: 09/20/2021 08:59   DG Chest 1 View  Result Date: 09/19/2021 CLINICAL DATA:  Respiratory failure EXAM: CHEST  1 VIEW COMPARISON:  09/17/2021 FINDINGS: Cardiac enlargement. Status post median sternotomy, aortic valve replacement and left atrial clipping. Aortic atherosclerosis. Unchanged left IJ catheter tip projects over the SVC. Feeding tube tip is below the GE junction. Interval removal of the Swan-Ganz catheter. There is a moderate to large left pleural effusion which appears increased from the previous exam. Mild interstitial edema is identified. Airspace opacities scratch set progressive airspace opacities identified within the right lower lung and left mid lung. IMPRESSION: 1. Worsening aeration to the right lower lung and left mid lung. 2. Increase in size of left pleural effusion with mild diffuse edema. Electronically Signed   By: Kerby Moors M.D.   On: 09/19/2021 09:53   DG Chest 2 View  Result Date: 09/04/2021 CLINICAL DATA:  Preop for cardiac surgery. EXAM: CHEST - 2 VIEW COMPARISON:  October 14, 2019. FINDINGS: Stable  cardiomegaly with central pulmonary vascular congestion. Increased bilateral interstitial densities are noted throughout both lungs and particularly in both lung bases, concerning for pulmonary edema. Minimal bilateral pleural effusions may be present. No pneumothorax is noted. Bony thorax is unremarkable. IMPRESSION: Stable cardiomegaly with central pulmonary vascular congestion and probable bilateral pulmonary edema and small bilateral pleural effusions. Electronically Signed   By: Marijo Conception M.D.   On: 09/04/2021 16:58   DG Abd 1 View  Result Date: 09/14/2021 CLINICAL DATA:  Ileus EXAM: ABDOMEN - 1 VIEW COMPARISON:  09/10/2021 FINDINGS: Feeding tube is in place with the tip in the descending duodenum. Calcified gallstone in the right upper quadrant. Gas within mildly prominent large and small bowel loops in the right lower abdomen. No evidence of bowel obstruction or significant ileus. No free air organomegaly. IMPRESSION: No acute findings. Cholelithiasis. Electronically Signed   By: Rolm Baptise M.D.   On:  09/14/2021 08:59   DG CHEST PORT 1 VIEW  Result Date: 09/17/2021 CLINICAL DATA:  Status post central line placement EXAM: PORTABLE CHEST 1 VIEW COMPARISON:  Film from earlier in the same day. FINDINGS: Endotracheal tube, feeding catheter and Swan-Ganz catheter are again noted and stable. New left jugular central line is noted with the catheter tip in the mid superior vena cava. No pneumothorax is seen. Cardiac shadow is stable. Postsurgical changes are noted. Vascular congestion is noted with some mild interstitial edema in the bases. IMPRESSION: No pneumothorax following central line placement. Mild vascular congestion and basilar edema. Electronically Signed   By: Inez Catalina M.D.   On: 09/17/2021 15:40   DG Chest Port 1 View  Result Date: 09/17/2021 CLINICAL DATA:  Follow-up left thoracentesis EXAM: PORTABLE CHEST 1 VIEW COMPARISON:  09/17/2021 FINDINGS: Endotracheal tube tip 4 cm above  the carina. Soft feeding tube enters the abdomen. Swan-Ganz catheter tip in the right main pulmonary artery. Lung bases are not included on the film. There is persistent pleural density on the left with left lower lung volume loss. There is no evidence of left side pneumothorax postprocedure. IMPRESSION: Lower portion of the chest not included on the image. No sign of left pneumothorax following thoracentesis. Electronically Signed   By: Nelson Chimes M.D.   On: 09/17/2021 10:16   DG Chest Port 1 View  Result Date: 09/17/2021 CLINICAL DATA:  Pulmonary edema. EXAM: PORTABLE CHEST 1 VIEW COMPARISON:  September 16, 2021. FINDINGS: Endotracheal and feeding tubes are unchanged in position. Status post aortic valve repair. Right internal jugular Swan-Ganz catheter is unchanged. No pneumothorax is noted. Stable bilateral lung opacities are noted concerning for edema. Bony thorax is unremarkable. IMPRESSION: Stable support apparatus. Stable probable bilateral pulmonary edema. Electronically Signed   By: Marijo Conception M.D.   On: 09/17/2021 08:07   DG CHEST PORT 1 VIEW  Result Date: 09/16/2021 CLINICAL DATA:  Central line placement. EXAM: PORTABLE CHEST 1 VIEW COMPARISON:  09/16/2021 FINDINGS: An endotracheal tube with tip with tip 6.3 cm above the carina, small bore feeding tube entering the stomach with tip off the field of view and RIGHT IJ Swan-Ganz catheter with tip overlying the main pulmonary artery again noted. Cardiomegaly, cardiac valve replacement with bilateral interstitial/airspace opacities/edema again noted as well as LEFT LOWER lung consolidation/atelectasis. No pneumothorax. IMPRESSION: 1. Little interval change with bilateral interstitial/airspace opacities/edema and LEFT LOWER lung consolidation/atelectasis again noted. 2. Support apparatus as described. Electronically Signed   By: Margarette Canada M.D.   On: 09/16/2021 18:14   DG Chest Port 1 View  Result Date: 09/16/2021 CLINICAL DATA:  Pulmonary  edema. EXAM: PORTABLE CHEST 1 VIEW COMPARISON:  09/15/2021 FINDINGS: 0612 hours. The cardio pericardial silhouette is enlarged. Endotracheal tube tip is approximately 6.7 cm above the base of the carina. A feeding tube passes into the stomach although the distal tip position is not included on the film. Right IJ pulmonary artery catheter tip is in the interlobar pulmonary artery. Vascular congestion noted with diffuse interstitial opacity suggesting edema. Similar retrocardiac collapse/consolidation. The visualized bony structures of the thorax show no acute abnormality. Telemetry leads overlie the chest. IMPRESSION: 1. No substantial change. Vascular congestion with probable interstitial pulmonary edema. 2. Similar retrocardiac collapse/consolidation. Electronically Signed   By: [name] Stanley M.D.   On: 09/16/2021 08:28   DG CHEST PORT 1 VIEW  Result Date: 09/15/2021 CLINICAL DATA:  Respiratory failure, difficult intubation. Postop day 9 status post aortic valve replacement. EXAM: PORTABLE  CHEST 1 VIEW COMPARISON:  09/15/2021 at 6:02 a.m. FINDINGS: The tip of the endotracheal tube is somewhat indistinct. The tube tip is thought to likely be about 6.3 cm above the carina. Right internal jugular Swan-Ganz catheter tip: Right pulmonary artery. A feeding tube extends into the stomach. Prosthetic aortic valve. Indistinct AP window. Atherosclerotic calcification of the aortic arch. Bilateral interstitial accentuation similar to the earlier radiograph from today. Retrocardiac airspace opacity with obscuration left hemidiaphragm. IMPRESSION: 1. The tip of the endotracheal tube is thought to be about 6.3 cm above the carina. 2. Swan-Ganz catheter has mildly advanced and is currently in the right pulmonary artery. 3. Stable bilateral interstitial accentuation in the lungs favoring interstitial edema. Stable retrocardiac airspace opacity with obscuration of the left hemidiaphragm. 4.  Aortic Atherosclerosis (ICD10-I70.0).  Electronically Signed   By: Van Clines M.D.   On: 09/15/2021 17:27   DG CHEST PORT 1 VIEW  Result Date: 09/15/2021 CLINICAL DATA:  Chest congestion.  History of COPD. EXAM: PORTABLE CHEST 1 VIEW COMPARISON:  September 13, 2021 FINDINGS: The ET tube is in good position. The distal feeding tube terminates below today's film. No pneumothorax. Pulmonary edema identified. The PA catheter is stable. No other acute interval changes. IMPRESSION: 1. Support apparatus as above, unchanged. 2. Pulmonary edema, worsened in the interval. 3. No other interval changes. Electronically Signed   By: Dorise Bullion III M.D.   On: 09/15/2021 08:56   DG CHEST PORT 1 VIEW  Result Date: 09/13/2021 CLINICAL DATA:  Respiratory failure EXAM: PORTABLE CHEST 1 VIEW COMPARISON:  09/13/2021 FINDINGS: Unchanged support apparatus. Persistent cardiomegaly with improved interstitial opacity. Left retrocardiac consolidation is unchanged. IMPRESSION: Improved interstitial edema.  Otherwise unchanged. Electronically Signed   By: Ulyses Jarred M.D.   On: 09/13/2021 19:18   DG Chest Port 1 View  Result Date: 09/13/2021 CLINICAL DATA:  Respiratory failure.  Status post bronchoscopy. EXAM: PORTABLE CHEST 1 VIEW COMPARISON:  09/13/2021, earlier the same day FINDINGS: 0539 hours. Endotracheal tube tip is approximately 6.7 cm above the base of the carina. A feeding tube passes into the stomach although the distal tip position is not included on the film. Right IJ pulmonary artery catheter tip is in the main pulmonary outflow tract. The cardio pericardial silhouette is enlarged. There is pulmonary vascular congestion without overt pulmonary edema. With diffuse interstitial pulmonary edema pattern interval decrease in the diffuse left lung airspace opacity with persistent bibasilar atelectasis/infiltrate. Small bilateral pleural effusion suspected. IMPRESSION: 1. No evidence for pneumothorax or pneumomediastinum status post bronchoscopy. 2.  Marked interval improvement in left lung aeration with persistent bibasilar atelectasis/infiltrate. 3. Probable small effusions with diffuse interstitial opacity suggesting edema. Electronically Signed   By: [name] Stanley M.D.   On: 09/13/2021 06:05   DG CHEST PORT 1 VIEW  Result Date: 09/13/2021 CLINICAL DATA:  ETT EXAM: PORTABLE CHEST 1 VIEW COMPARISON:  09/11/2021 FINDINGS: Endotracheal tube terminates 8 cm above the carina. Right IJ Swan-Ganz catheter terminates in the main pulmonary artery. Enteric tube courses below the diaphragm. Near complete opacification of the left hemithorax, new, reflecting a large left pleural effusion. Mild pulmonary vascular congestion/interstitial edema. Suspected small right pleural effusion. No pneumothorax. Cardiomegaly.  Prosthetic valve.  Thoracic aortic atherosclerosis. Median sternotomy. IMPRESSION: Endotracheal tube terminates 8 cm above the carina. Additional support apparatus as above. Large left pleural effusion, new. Mild pulmonary vascular congestion/interstitial edema. Suspected small right pleural effusion. Electronically Signed   By: Julian Hy M.D.   On: 09/13/2021 03:55  DG CHEST PORT 1 VIEW  Result Date: 09/11/2021 CLINICAL DATA:  Status post AVR EXAM: PORTABLE CHEST 1 VIEW COMPARISON:  09/09/2021 FINDINGS: Postsurgical changes are again seen. Endotracheal tube and feeding catheter are noted. Swan-Ganz catheter is noted in the pulmonary outflow tract. 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 Mediastinal drain is been removed in the interval. Lungs are well aerated bilaterally. Mild central vascular congestion is noted without effusion or focal infiltrate. IMPRESSION: Mild vascular congestion. Postoperative changes with tubes and lines as described. Electronically Signed   By: Inez Catalina M.D.   On: 09/11/2021 08:43   DG CHEST PORT 1 VIEW  Result Date: 09/09/2021 CLINICAL DATA:  Assess chest tube. EXAM: PORTABLE CHEST 1  VIEW COMPARISON:  September 08, 2021 FINDINGS: Mediastinal contour and cardiac silhouette are stable. Heart size is enlarged. Endotracheal tube is unchanged. Swan-Ganz catheter is unchanged. Mediastinal drain is unchanged. Mild diffuse increased pulmonary interstitium is identified bilaterally. There is minimal left pleural effusion. The bony structures are stable. IMPRESSION: Mild congestive heart failure.  Minimal left pleural effusion. Life supporting devices are unchanged. Electronically Signed   By: Abelardo Diesel M.D.   On: 09/09/2021 08:40   DG CHEST PORT 1 VIEW  Result Date: 09/08/2021 CLINICAL DATA:  71 year old male, status post median sternotomy, aortic valve replacement, biatrial Maze procedure EXAM: PORTABLE CHEST 1 VIEW COMPARISON:  Multiple recent prior, most recent 09/07/2021, baseline 09/04/2021 FINDINGS: Cardiac diameter projects enlarged compared to the comparison studies, though potentially secondary to the more lordotic positioning. Calcifications of the aortic arch. Surgical changes of median sternotomy, atrial clipping, and aortic valve repair. Right IJ sheath remains, with unchanged position of the Swan-Ganz catheter. Mediastinal/pleural drains remain, as well as epicardial pacing leads partially imaged. Endotracheal tube terminates 6 cm above the carina. Gastric tube unchanged, terminating out of the field of view. No pneumothorax. Opacity at the left lung base obscuring the left hemidiaphragm. Hazy opacities in the lower lungs, likely reflecting atelectasis. IMPRESSION: The cardiac diameter projects enlarged compared to prior plain films though may be secondary to the lordotic positioning of the patient. If there was concern for pericardial effusion, correlation with ECHO may be useful. Surgical changes of median sternotomy, aortic valve repair, atrial clipping. Unchanged endotracheal tube, gastric tube, right IJ sheath transmits a Swan-Ganz catheter, and mediastinal/pleural drains. Hazy  opacities at the lung bases likely atelectasis. Residual pleural effusion at the left lung base not excluded. Electronically Signed   By: Corrie Mckusick D.O.   On: 09/08/2021 09:45   DG Chest Port 1 View  Result Date: 09/07/2021 CLINICAL DATA:  Aortic valve replacement EXAM: PORTABLE CHEST 1 VIEW COMPARISON:  09/24/2021 FINDINGS: ET tube terminates approximately 4.8 cm above the carina. Enteric tube courses below the diaphragm with distal tip beyond the inferior margin of the film. Right IJ approach Swan-Ganz catheter terminates in the proximal pulmonary outflow. Mediastinal drain remains in place. The previously seen left chest tube is not included within the field of view on today's study. Median sternotomy and prosthetic aortic valve and left atrial appendage clipping. Stable cardiomegaly. Aortic atherosclerosis. Bibasilar atelectasis. Probable small bilateral pleural effusions. Aeration of the lung bases has slightly improved from prior. No pneumothorax. IMPRESSION: 1. Support lines and tubes as above. 2. Bibasilar atelectasis and probable small bilateral pleural effusions. Aeration of the lung bases has slightly improved from prior. Electronically Signed   By: Davina Poke D.O.   On: 09/07/2021 08:56   DG Chest [facility]  Result Date: 09/12/2021 CLINICAL DATA:  Pneumothorax.  Aortic valve replacement today. EXAM: PORTABLE CHEST 1 VIEW COMPARISON:  Preoperative radiograph 09/04/2021 FINDINGS: Interval median sternotomy with aortic valve replacement and left atrial clipping. Endotracheal tube tip is at the level of the clavicular heads. Enteric tube is in place with tip below the diaphragm not included in the field of view. Right internal jugular Swan-Ganz catheter tip in the region of the proximal main pulmonary outflow tract. Mediastinal drain in place. Left chest tube in place. Cardiomegaly. Stable mediastinal contours. Hazy bilateral lung opacities likely combination of atelectasis and pleural  fluid. No visualized pneumothorax. Incidental gallstone in the upper abdomen. IMPRESSION: 1. Interval median sternotomy with aortic valve replacement and left atrial clipping. 2. Support apparatus as described. 3. Hazy bilateral lung opacities likely combination of atelectasis and pleural fluid. No visualized pneumothorax. Electronically Signed   By: Keith Rake M.D.   On: 09/09/2021 18:33   DG Abd Portable 1V  Result Date: 09/17/2021 CLINICAL DATA:  Ileus EXAM: PORTABLE ABDOMEN - 1 VIEW COMPARISON:  September 16 FINDINGS: Enteric tube with tip appearing to be in the first portion of the duodenum. There are scattered gas-filled loops of large with a paucity of small bowel gas. There appears to be a calcified gallstone in the right upper quadrant. Vascular calcifications. No acute osseous abnormality. IMPRESSION: Stable positioning of enteric tube with tip overlying the first portion of duodenum. No significantly dilated loops of bowel seen. Electronically Signed   By: Albin Felling M.D.   On: 09/17/2021 09:17   DG Abd Portable 1V  Result Date: 09/10/2021 CLINICAL DATA:  Feeding tube EXAM: PORTABLE ABDOMEN - 1 VIEW COMPARISON:  None. FINDINGS: The bowel gas pattern is normal. Enteric feeding tube projects over the expected area of the pylorus. No radio-opaque calculi or other significant radiographic abnormality are seen. Partially visualized lung bases demonstrate a PA catheter, cardiomegaly and mild pulmonary edema. IMPRESSION: Enteric feeding tube projects over the expected area of the pylorus. Electronically Signed   By: Yetta Glassman M.D.   On: 09/10/2021 13:01   DG Abd Portable 1V  Result Date: 09/01/2021 CLINICAL DATA:  OG tube placement. EXAM: PORTABLE ABDOMEN - 1 VIEW COMPARISON:  None. FINDINGS: Orogastric tube tip is at the level of the mid stomach. Multiple lines and tubes are seen overlying the lower chest and abdomen. The heart appears enlarged. No dilated bowel loops are seen. There  is a left pleural effusion. IMPRESSION: 1. Orogastric tube tip at the level of the mid stomach. 2. Left pleural effusion. Electronically Signed   By: Ronney Asters M.D.   On: 09/14/2021 20:54   ECHOCARDIOGRAM COMPLETE  Result Date: 09/09/2021    ECHOCARDIOGRAM REPORT   Patient Name:   [name] Cueva. Date of Exam: 09/09/2021 Medical Rec #:  [id number]          Height:       73.0 in Accession #:    [id number]         Weight:       315.3 lb Date of Birth:  August 02, 1951         BSA:          2.611 m Patient Age:    83 years           BP:           92/61 mmHg Patient Gender: M  HR:           50 bpm. Exam Location:  Inpatient Procedure: 2D Echo Indications:    acute diastolic chf  History:        Patient has prior history of Echocardiogram examinations, most                 recent 07/10/2021. CAD, Maze procedure, COPD and chronic kidney                 disease; Risk Factors:Diabetes, Former Smoker, Sleep Apnea and                 Dyslipidemia.                 Aortic Valve: 27 mm Edwards Inspiris Resilia valve is present in                 the aortic position.  Sonographer:    Johny Chess RDCS Referring Phys: [place] Comments: Patient is morbidly obese, echo performed with patient supine and on artificial respirator and no subcostal window. Image acquisition challenging due to respiratory motion and Image acquisition challenging due to patient body habitus. IMPRESSIONS  1. Abnormal septal motion consistent with postoperative state. Gobal hypokinesis. Compared with the echo 12/5174, systolic function is worse. Left ventricular ejection fraction, by estimation, is 40 to 45%. The left ventricle has mildly decreased function. The left ventricle demonstrates regional wall motion abnormalities (see scoring diagram/findings for description). Left ventricular diastolic parameters are consistent with Grade II diastolic dysfunction (pseudonormalization).  2. Right ventricular  systolic function is normal. The right ventricular size is normal. There is normal pulmonary artery systolic pressure.  3. Left atrial size was severely dilated.  4. The mitral valve is degenerative. No evidence of mitral valve regurgitation. No evidence of mitral stenosis. Severe mitral annular calcification.  5. The aortic valve has been repaired/replaced. Aortic valve regurgitation is not visualized. No aortic stenosis is present. There is a 27 mm Edwards Inspiris Resilia valve present in the aortic position. Aortic valve area, by VTI measures 2.52 cm. Aortic valve mean gradient measures 11.0 mmHg. Aortic valve Vmax measures 2.23 m/s.  6. Aortic dilatation noted. There is mild dilatation of the aortic root, measuring 42 mm. There is moderate dilatation of the ascending aorta, measuring 43 mm. FINDINGS  Left Ventricle: Abnormal septal motion consistent with postoperative state. Gobal hypokinesis. Compared with the echo 12/6071, systolic function is worse. Left ventricular ejection fraction, by estimation, is 40 to 45%. The left ventricle has mildly decreased function. The left ventricle demonstrates regional wall motion abnormalities. The left ventricular internal cavity size was normal in size. There is no left ventricular hypertrophy. Left ventricular diastolic parameters are consistent with Grade II diastolic dysfunction (pseudonormalization). Right Ventricle: The right ventricular size is normal. No increase in right ventricular wall thickness. Right ventricular systolic function is normal. There is normal pulmonary artery systolic pressure. The tricuspid regurgitant velocity is 2.39 m/s, and  with an assumed right atrial pressure of 0 mmHg, the estimated right ventricular systolic pressure is 71.0 mmHg. Left Atrium: Left atrial size was severely dilated. Right Atrium: Right atrial size was normal in size. Pericardium: There is no evidence of pericardial effusion. Mitral Valve: The mitral valve is degenerative  in appearance. Severe mitral annular calcification. No evidence of mitral valve regurgitation. No evidence of mitral valve stenosis. MV peak gradient, 5.6 mmHg. The mean mitral valve gradient is 3.0 mmHg. Tricuspid Valve: The tricuspid  valve is normal in structure. Tricuspid valve regurgitation is trivial. No evidence of tricuspid stenosis. Aortic Valve: The aortic valve has been repaired/replaced. Aortic valve regurgitation is not visualized. No aortic stenosis is present. Aortic valve mean gradient measures 11.0 mmHg. Aortic valve peak gradient measures 19.9 mmHg. Aortic valve area, by VTI measures 2.52 cm. There is a 27 mm Edwards Inspiris Resilia valve present in the aortic position. Pulmonic Valve: The pulmonic valve was normal in structure. Pulmonic valve regurgitation is not visualized. No evidence of pulmonic stenosis. Aorta: Aortic dilatation noted. There is mild dilatation of the aortic root, measuring 42 mm. There is moderate dilatation of the ascending aorta, measuring 43 mm. IAS/Shunts: No atrial level shunt detected by color flow Doppler.  LEFT VENTRICLE PLAX 2D LVIDd:         4.70 cm      Diastology LVIDs:         3.80 cm      LV e' lateral:   5.33 cm/s LVOT diam:     2.20 cm      LV E/e' lateral: 17.3 LV SV:         83 LV SV Index:   32 LVOT Area:     3.80 cm  LV Volumes (MOD) LV vol d, MOD A2C: 209.0 ml LV vol s, MOD A2C: 131.0 ml LV SV MOD A2C:     78.0 ml LEFT ATRIUM              Index       RIGHT ATRIUM           Index LA diam:        4.40 cm  1.69 cm/m  RA Area:     21.70 cm LA Vol (A2C):   124.0 ml 47.50 ml/m RA Volume:   62.00 ml  23.75 ml/m LA Vol (A4C):   110.0 ml 42.13 ml/m LA Biplane Vol: 120.0 ml 45.96 ml/m  AORTIC VALVE AV Area (Vmax):    2.54 cm AV Area (Vmean):   2.43 cm AV Area (VTI):     2.52 cm AV Vmax:           223.00 cm/s AV Vmean:          153.500 cm/s AV VTI:            0.330 m AV Peak Grad:      19.9 mmHg AV Mean Grad:      11.0 mmHg LVOT Vmax:         149.00 cm/s  LVOT Vmean:        98.250 cm/s LVOT VTI:          0.219 m LVOT/AV VTI ratio: 0.66  AORTA Ao Asc diam: 4.30 cm MITRAL VALVE               TRICUSPID VALVE MV Area (PHT): 3.72 cm    TR Peak grad:   22.8 mmHg MV Area VTI:   3.80 cm    TR Vmax:        239.00 cm/s MV Peak grad:  5.6 mmHg MV Mean grad:  3.0 mmHg    SHUNTS MV Vmax:       1.18 m/s    Systemic VTI:  0.22 m MV Vmean:      76.3 cm/s   Systemic Diam: 2.20 cm MV Decel Time: 204 msec MV E velocity: 92.40 cm/s MV A velocity: 63.00 cm/s MV E/A ratio:  1.47 Skeet Latch MD Electronically signed by Jonelle Sidle  Oval Linsey MD Signature Date/Time: 09/09/2021/5:13:00 PM    Final    ECHO INTRAOPERATIVE TEE  Result Date: 09/07/2021  *INTRAOPERATIVE TRANSESOPHAGEAL REPORT *  Patient Name:   [name] Leu. Date of Exam: 09/23/2021 Medical Rec #:  [id number]          Height:       73.0 in Accession #:    [id number]         Weight:       301.1 lb Date of Birth:  01-Jan-1951         BSA:          2.56 m Patient Age:    38 years           BP:           145/46 mmHg Patient Gender: M                  HR:           48 bpm. Exam Location:  Anesthesiology Transesophogeal exam was perform intraoperatively during surgical procedure. Patient was closely monitored under general anesthesia during the entirety of examination. 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 POST-OP IMPRESSIONS _ Left Ventricle: The left ventricle is unchanged from pre-bypass. _ Right Ventricle: The right ventricle appears unchanged from pre-bypass mildly reduced function. The cavity was mildly dilated. _ Aorta: The aorta appears unchanged from pre-bypass there is no dissection present in the aorta. _ Left Atrium: The left atrium appears unchanged from pre-bypass. _ Left Atrial Appendage: A is completely occluded Atrial Clip was placed. _ Aortic Valve: There is no  regurgitation. No regurgitation post repair. The gradient recorded across the prosthetic valve is within the expected range. No perivalvular leak noted. _ Mitral Valve: The mitral valve appears unchanged from pre-bypass. _ Pulmonic Valve: The pulmonic valve appears unchanged from pre-bypass. _ Interventricular Septum: The interventricular septum appears unchanged from pre-bypass. PRE-OP FINDINGS  Left Ventricle: The left ventricle has normal systolic function, with an ejection fraction of 55-60%. The cavity size was normal. No evidence of left ventricular regional wall motion abnormalities. There is no left ventricular hypertrophy. Left ventricular diastolic parameters were normal. Right Ventricle: The right ventricle has moderately reduced systolic function. The cavity was normal. There is no increase in right ventricular wall thickness. There is no aneurysm seen. Left Atrium: Left atrial size was dilated. No left atrial/left atrial appendage thrombus was detected. The left atrial appendage is well visualized and there is no evidence of thrombus present. Left atrial appendage velocity is normal at greater than 40 cm/s. Right Atrium: Right atrial size was dilated. Interatrial Septum: No atrial level shunt detected by color flow Doppler. There is no evidence of a patent foramen ovale. Pericardium: There is no evidence of pericardial effusion. There is no pleural effusion. Mitral Valve: The mitral valve is normal in structure. Mitral valve regurgitation is trivial by color flow Doppler. There is no evidence of mitral valve vegetation. There is No evidence of mitral stenosis. Tricuspid Valve: The tricuspid valve was normal in structure. Tricuspid valve regurgitation is moderate by color flow Doppler. The jet is directed centrally. No evidence of tricuspid stenosis is present. There is no evidence of tricuspid valve vegetation. Aortic Valve: The aortic valve is tricuspid Aortic valve regurgitation Moderate to Severe.  There is mild stenosis of the aortic valve. There is no evidence of  aortic valve vegetation. Pulmonic Valve: The pulmonic valve was normal in structure. Pulmonic valve regurgitation is trivial by color flow Doppler. Aorta: The aortic root, ascending aorta and aortic arch are normal in size and structure. Pulmonary Artery: The pulmonary artery is moderately dilated. Pulmonary hypertension is severe. Venous: The inferior vena cava was not well visualized. Shunts: There is no evidence of an atrial septal defect. +--------------+-------++ LEFT VENTRICLE        +--------------+-------++ PLAX 2D               +--------------+-------++ LVIDd:        7.90 cm +--------------+-------++ LVIDs:        5.60 cm +--------------+-------++ LV SV:        181 ml  +--------------+-------++ LV SV Index:  67.02   +--------------+-------++                       +--------------+-------++ +-------------+------------++ AORTIC VALVE              +-------------+------------++ AV Vmax:     163.38 cm/s  +-------------+------------++ AV Vmean:    100.075 cm/s +-------------+------------++ AV VTI:      0.363 m      +-------------+------------++ AV Peak Grad:10.7 mmHg    +-------------+------------++ AV Mean Grad:5.5 mmHg     +-------------+------------++  Suella Broad MD Electronically signed by Suella Broad MD Signature Date/Time: 09/07/2021/4:20:57 PM    Final    VAS US DOPPLER PRE CABG  Result Date: 09/04/2021 PREOPERATIVE VASCULAR EVALUATION Patient Name:  [name] Begay.  Date of Exam:   09/04/2021 Medical Rec #: [id number]           Accession #:    [id number] Date of Birth: 12/19/1951          Patient Gender: M Patient Age:   14 years Exam Location:  [facility] Procedure:      VAS US DOPPLER PRE CABG Referring Phys: Gilford Raid --------------------------------------------------------------------------------  Indications:      Pre-CABG. Risk Factors:     Hyperlipidemia. Other  Factors:    Aortic stenosis. Comparison Study: 06/05/2018 carotid artery duplex 1-39% bilateral ICA stenosis. Performing Technologist: Maudry Mayhew MHA, RVT, RDCS, RDMS  Examination Guidelines: A complete evaluation includes B-mode imaging, spectral Doppler, color Doppler, and power Doppler as needed of all accessible portions of each vessel. Bilateral testing is considered an integral part of a complete examination. Limited examinations for reoccurring indications may be performed as noted.  Right Carotid Findings: +----------+--------+--------+--------+--------------------------+--------+           PSV cm/sEDV cm/sStenosisDescribe                  Comments +----------+--------+--------+--------+--------------------------+--------+ CCA Prox  112     4                                                  +----------+--------+--------+--------+--------------------------+--------+ CCA Distal83      9                                                  +----------+--------+--------+--------+--------------------------+--------+ ICA Prox  62      12                                                 +----------+--------+--------+--------+--------------------------+--------+  ICA Distal76      26                                                 +----------+--------+--------+--------+--------------------------+--------+ ECA       141     9               heterogenous and irregular         +----------+--------+--------+--------+--------------------------+--------+ +----------+--------+-------+----------------+------------+           PSV cm/sEDV cmsDescribe        Arm Pressure +----------+--------+-------+----------------+------------+ Subclavian227            Multiphasic, WNL             +----------+--------+-------+----------------+------------+ +---------+--------+--+--------+--+---------+ VertebralPSV cm/s64EDV cm/s14Antegrade  +---------+--------+--+--------+--+---------+ Left Carotid Findings: +----------+--------+--------+--------+--------------------------+---------+           PSV cm/sEDV cm/sStenosisDescribe                  Comments  +----------+--------+--------+--------+--------------------------+---------+ CCA Prox  119     16                                                  +----------+--------+--------+--------+--------------------------+---------+ CCA Distal111     16                                                  +----------+--------+--------+--------+--------------------------+---------+ ICA Prox  80      15              heterogenous and irregularShadowing +----------+--------+--------+--------+--------------------------+---------+ ICA Distal116     32                                                  +----------+--------+--------+--------+--------------------------+---------+ ECA       153     10                                                  +----------+--------+--------+--------+--------------------------+---------+  +----------+--------+--------+----------------+------------+ SubclavianPSV cm/sEDV cm/sDescribe        Arm Pressure +----------+--------+--------+----------------+------------+           135             Multiphasic, WNL             +----------+--------+--------+----------------+------------+ +---------+--------+--+--------+--+---------+ VertebralPSV cm/s57EDV cm/s12Antegrade +---------+--------+--+--------+--+---------+  ABI Findings: +--------+------------------+-----+---------+--------+ Right   Rt Pressure (mmHg)IndexWaveform Comment  +--------+------------------+-----+---------+--------+ [id number]                    triphasic         +--------+------------------+-----+---------+--------+ +--------+------------------+-----+---------+-------+ Left    Lt Pressure (mmHg)IndexWaveform Comment  +--------+------------------+-----+---------+-------+ [id number]                    triphasic        +--------+------------------+-----+---------+-------+  Right Doppler Findings: +-----------+--------+-----+---------+--------------------+ Site  PressureIndexDoppler  Comments             +-----------+--------+-----+---------+--------------------+ Brachial   158          triphasic                     +-----------+--------+-----+---------+--------------------+ Radial                  triphasic                     +-----------+--------+-----+---------+--------------------+ Ulnar                   triphasic                     +-----------+--------+-----+---------+--------------------+ Palmar Arch                      Within normal limits +-----------+--------+-----+---------+--------------------+  Left Doppler Findings: +-----------+--------+-----+---------+-----------------------------------------+ Site       PressureIndexDoppler  Comments                                  +-----------+--------+-----+---------+-----------------------------------------+ Brachial   159          triphasic                                          +-----------+--------+-----+---------+-----------------------------------------+ Radial                  triphasic                                          +-----------+--------+-----+---------+-----------------------------------------+ Ulnar                   triphasic                                          +-----------+--------+-----+---------+-----------------------------------------+ Palmar Arch                      Signal is unaffected with radial                                           compression, decreases >50% with ulnar                                     compression.                              +-----------+--------+-----+---------+-----------------------------------------+  Summary: Right Carotid:  Velocities in the right ICA are consistent with a 1-39% stenosis. Left Carotid: Velocities in the left ICA are consistent with a 1-39% stenosis. Vertebrals:  Bilateral vertebral arteries demonstrate antegrade flow. Subclavians: Normal flow hemodynamics were seen in bilateral subclavian              arteries. Right Upper Extremity: Doppler waveforms remain within normal limits with right  radial compression. Doppler waveforms remain within normal limits with right ulnar compression. Left Upper Extremity: Doppler waveforms remain within normal limits with left radial compression. Doppler waveforms decrease >50% with left ulnar compression.   Electronically signed by Harold Barban MD on 09/04/2021 at 7:22:24 PM.    Final      Assessment & Plan:   Obstructive sleep apnea Patient is compliant with CPAP but still having some events, mostly central apneas likely d.t underlying cardiac disease. Will hold off on titration study until after cardiac surgery  Airview download 07/11/21-08/09/21 28/30 days used (93%); 21 days used (70%) Pressure- 10-20cm h20 (14.9cm h20-95%) Airleaks- 19.6L/min (95%) Events per hour - AI 6.4; HI 7.8 Apnea index- Central 4.4; Obstructive 1.2 AHI 14.2  COPD (chronic obstructive pulmonary disease) ([place]) Pulmonary fuctoin testing looks stable, diffusion capacity is some worse d/t emphysema/ fibrosis or scarring    PFT 08/09/21- FVC 3.42 (75%), FEV1 2. 60 (77%), ratio 76, DLCOunc 10/29 (38%)   Martyn Ehrich, NP 09/28/2021

## 2021-08-13 ENCOUNTER — Other Ambulatory Visit: Payer: Self-pay | Admitting: *Deleted

## 2021-08-21 NOTE — Telephone Encounter (Signed)
Next Appt With Pulmonology [facility], MD)10/19/2021 at  3:00 PM

## 2021-08-27 ENCOUNTER — Ambulatory Visit (INDEPENDENT_AMBULATORY_CARE_PROVIDER_SITE_OTHER): Payer: Medicare Other | Admitting: Physician Assistant

## 2021-08-27 ENCOUNTER — Other Ambulatory Visit: Payer: Self-pay

## 2021-08-27 ENCOUNTER — Ambulatory Visit (HOSPITAL_COMMUNITY)
Admission: RE | Admit: 2021-08-27 | Discharge: 2021-08-27 | Disposition: A | Discharge status: Home / Self Care | Payer: Medicare Other | Source: Ambulatory Visit | Attending: Surgery | Admitting: Surgery

## 2021-08-27 VITALS — BP 171/68 | HR 56 | Temp 97.9°F | Resp 20 | Ht 73.0 in | Wt 303.4 lb

## 2021-08-27 DIAGNOSIS — I714 Abdominal aortic aneurysm, without rupture, unspecified: Secondary | ICD-10-CM

## 2021-08-27 DIAGNOSIS — I723 Aneurysm of iliac artery: Secondary | ICD-10-CM | POA: Diagnosis present

## 2021-08-27 NOTE — Progress Notes (Signed)
[facility] [place] HISTORY AND PHYSICAL   History of Present Illness:  Patient is a 70 y.o. year old male who presents for evaluation of abdominal aortic aneurysm.  He presents for follow up of right common iliac artery aneurysm. Initially the CIA aneurysm was found incidentally on CT.  He denise claudication, rest pain and tissue loss.   Hx of paroxysmal atrial fibrillation since 2005. He has undergone multiple cardioversion's usually when he becomes symptomatic. He remains on Pradaxa and Tikosyn for management.  He is O2 dependent and has Aortic insufficieny with planned valve replacement 09/07/2021 by Dr. [name].     The pt is on a statin for cholesterol management.  The pt not on a daily aspirin.   Other AC: Pradaxa The pt is not on any medication for hypertension.   The pt is diabetic.  Tobacco hx:  Former smoker, quit 2010  Past Medical History:  Diagnosis Date   Atrial fibrillation ([place]) 03/22/2009   a. s/p multiple DCCV; b. no coumadin due to low TE risk profile; c. Tikosyn Rx   Coronary atherosclerosis of native coronary artery 11/2002   a. s/p stent to LAD 12/03; OM2 occluded at cath 12/03; d. myoview 5/10: no ischemia;  e. echo 7/11: EF 55%, BAE, mild RVE, PASP 41-45; Myoview was in March 2013. There was no ischemia or infarction, EF 51%    Cutaneous abscess of back excluding buttocks 07/04/2014   Appears to stem from possibly a cyst very large area 6 cm contact surgeon office    Diabetes mellitus without complication ([place])    Drusen body    see opth note   Epiglottitis    w emergency nt intubation   ERECTILE DYSFUNCTION 03/22/2009   GERD 03/22/2009   HYPERGLYCEMIA 04/25/2010   HYPERLIPIDEMIA 03/22/2009   Iliac aneurysm ([place])    2.6 to be evaluated incidental finding on CT   LATERAL EPICONDYLITIS, LEFT 10/24/2009   LIVER FUNCTION TESTS, ABNORMAL 04/25/2010   Local reaction to immunization 05/05/2012   minor resolving  zostavax    Myocardial infarction [facility])  [id number]   Numbness in left leg    foot related to back disease and surgery   Obesity, unspecified 04/24/2009   Perforated appendicitis with necrosis s/p open appendectomy 06/07/14 06/04/2014   Renal cyst    Characterized by MRI as simple   Ruptured suppurative appendicitis    2015    SLEEP APNEA, OBSTRUCTIVE 03/22/2009   compliant with CPAP   THROMBOCYTOPENIA 08/16/2010   TOBACCO USE, QUIT 10/24/2009   ULNAR NEUROPATHY, LEFT 0/96/2836   Umbilical hernia     Past Surgical History:  Procedure Laterality Date   APPENDECTOMY N/A 06/06/2014   Procedure: APPENDECTOMY;  Surgeon: Odis Hollingshead, MD;  Location: [facility];  Service: General;  Laterality: N/A;   COLON SURGERY     COLONOSCOPY  11/15/2011   Procedure: COLONOSCOPY;  Surgeon: Juanita Craver, MD;  Location: [facility];  Service: Endoscopy;  Laterality: N/A;   COLONOSCOPY WITH PROPOFOL N/A 01/03/2017   Procedure: COLONOSCOPY WITH PROPOFOL;  Surgeon: Carol Ada, MD;  Location: [facility];  Service: Endoscopy;  Laterality: N/A;   COLONOSCOPY WITH PROPOFOL N/A 05/26/2020   Procedure: COLONOSCOPY WITH PROPOFOL;  Surgeon: Carol Ada, MD;  Location: [facility];  Service: Endoscopy;  Laterality: N/A;   cyst on epiglottis  08/2002   ESOPHAGOGASTRODUODENOSCOPY  11/15/2011   Procedure: ESOPHAGOGASTRODUODENOSCOPY (EGD);  Surgeon: Juanita Craver, MD;  Location: [facility];  Service: Endoscopy;  Laterality: N/A;  LAPAROSCOPIC APPENDECTOMY N/A 06/06/2014   Procedure: APPENDECTOMY LAPAROSCOPIC attemted;  Surgeon: Odis Hollingshead, MD;  Location: [facility];  Service: General;  Laterality: N/A;   LUMBAR DISC SURGERY     two  holes in spinalcovering   POLYPECTOMY  05/26/2020   Procedure: POLYPECTOMY;  Surgeon: Carol Ada, MD;  Location: [facility];  Service: Endoscopy;;   RIGHT/LEFT HEART CATH AND CORONARY ANGIOGRAPHY N/A 07/16/2021   Procedure: RIGHT/LEFT HEART CATH AND CORONARY ANGIOGRAPHY;  Surgeon: Jettie Booze, MD;  Location: [place]  CV LAB;  Service: Cardiovascular;  Laterality: N/A;   stent     LAD DUS 2004   STERNAL WOUND DEBRIDEMENT Left 08/31/2019   Procedure: LEFT STERNOCLAVICULAR WOUND DEBRIDEMENT WITH APPLICATION OF WOUND VAC;  Surgeon: Grace Isaac, MD;  Location: [place];  Service: Thoracic;  Laterality: Left;   surgery l4-l5   1998   ruptured x 3   TEE WITHOUT CARDIOVERSION N/A 07/10/2021   Procedure: TRANSESOPHAGEAL ECHOCARDIOGRAM (TEE);  Surgeon: Lelon Perla, MD;  Location: [facility] ENDOSCOPY;  Service: Cardiovascular;  Laterality: N/A;   ulnar neuropathy     UMBILICAL HERNIA REPAIR     mesh     Social History Social History   Tobacco Use   Smoking status: Former    Packs/day: 2.00    Years: 42.00    Pack years: 84.00    Types: Cigarettes    Quit date: 12/30/2008    Years since quitting: 12.6   Smokeless tobacco: Never   Tobacco comments:    started at age 89; 1-2 ppd; quit 2010  Vaping Use   Vaping Use: Never used  Substance Use Topics   Alcohol use: No    Alcohol/week: 0.0 standard drinks    Comment: rarely; maybe 1 beer a year   Drug use: No    Family History Family History  Problem Relation Age of Onset   Thyroid disease Mother    Ovarian cancer Mother    Breast cancer Mother    Lung cancer Father    Cancer Father     Allergies  Allergies  Allergen Reactions   Pseudoephedrine Other (See Comments)    Patient went into afib   Diltiazem Rash and Itching    Also 2019   Novocain [Procaine] Hives    Dentist appointment in 1958; since then has tolerated lidocaine and provocaine with no hives or difficulty.   Quinolones     Patient was warned about not using Cipro and similar antibiotics. Recent studies have raised concern that fluoroquinolone antibiotics could be associated with an increased risk of aortic aneurysm Fluoroquinolones have non-antimicrobial properties that might jeopardise the integrity of the extracellular matrix of the vascular wall In a  propensity score  matched cohort study in [place], there was a 66% increased rate of aortic aneurysm or dissection associated with oral fluoroquinolone use, compared wit   Sulfonamide Derivatives Other (See Comments)    Childhood reaction      Current Outpatient Medications  Medication Sig Dispense Refill   acetaminophen (TYLENOL) 325 MG tablet Take 650 mg by mouth every 6 (six) hours as needed for mild pain.     albuterol (PROVENTIL HFA;VENTOLIN HFA) 108 (90 Base) MCG/ACT inhaler Inhale 2 puffs into the lungs every 6 (six) hours as needed for wheezing or shortness of breath. 1 Inhaler 0   ALPRAZolam (XANAX) 0.5 MG tablet Take 1 tablet (0.5 mg total) by mouth 2 (two) times daily as needed for anxiety. 30 tablet 0  apixaban (ELIQUIS) 5 MG TABS tablet Take 1 tablet (5 mg total) by mouth 2 (two) times daily. 180 tablet 3   atorvastatin (LIPITOR) 80 MG tablet Take 1 tablet (80 mg total) by mouth daily.     Blood Glucose Monitoring Suppl (ONETOUCH VERIO) w/Device KIT Use to test blood sugars 1-2 times daily. 1 kit 1   brimonidine (ALPHAGAN) 0.15 % ophthalmic solution Place 1 drop into both eyes 3 (three) times daily.     busPIRone (BUSPAR) 10 MG tablet Take 1 tablet (10 mg total) by mouth 2 (two) times daily. 180 tablet 1   busPIRone (BUSPAR) 15 MG tablet TAKE 1/3 TABLET TWICE A DAY FOR 1 WEEK, THEN 2/3 TAB TWICE DAILY X1 WEEK, THEN 1 TAB TWICE A DAY 30 tablet 1   Cholecalciferol (VITAMIN D) 50 MCG (2000 UT) tablet Take 200-4,000 Units by mouth See admin instructions. Take 2000 units by mouth in the morning and 4000 units in the evening     COVID-19 mRNA Vac-TriS, Pfizer, (PFIZER-BIONT COVID-19 VAC-TRIS) SUSP injection Inject into the muscle. 0.3 mL 0   diphenhydrAMINE (BENADRYL) 25 MG tablet Take 25-50 mg by mouth at bedtime as needed for sleep.      dofetilide (TIKOSYN) 500 MCG capsule Take 1 capsule (500 mcg total) by mouth 2 (two) times daily.     famotidine (PEPCID) 10 MG tablet Take 10 mg by mouth as needed for  heartburn.      fexofenadine (ALLEGRA) 180 MG tablet Take 180 mg by mouth daily as needed for allergies.      fish oil-omega-3 fatty acids 1000 MG capsule Take 1 g by mouth 2 (two) times daily.     fluticasone (FLONASE) 50 MCG/ACT nasal spray Place 2 sprays into both nostrils daily.     hydrALAZINE (APRESOLINE) 100 MG tablet Take 1 tablet (100 mg total) by mouth 3 (three) times daily. 270 tablet 3   hydrALAZINE (APRESOLINE) 50 MG tablet Take 50 mg by mouth 2 (two) times daily.     Lancets (ONETOUCH ULTRASOFT) lancets Twice daily 100 each 6   losartan (COZAAR) 100 MG tablet Take 1 tablet (100 mg total) by mouth daily. 90 tablet 3   Magnesium 250 MG TABS Take 250-500 mg by mouth See admin instructions. Takes 250 mg by mouth in the morning and 500 mg in the evening     metFORMIN (GLUCOPHAGE-XR) 500 MG 24 hr tablet Take 1 tablet (500 mg total) by mouth in the morning, at noon, and at bedtime.     methocarbamol (ROBAXIN) 500 MG tablet TAKE 1 TABLET BY MOUTH 4 TIMES A DAY AS NEEDED FOR MUSCLE SPASM (Patient taking differently: Take 500 mg by mouth 4 (four) times daily as needed for muscle spasms.) 120 tablet 2   MULTIPLE VITAMIN PO Take 1 tablet by mouth every evening.     nitroGLYCERIN (NITROSTAT) 0.4 MG SL tablet Place 1 tablet (0.4 mg total) under the tongue every 5 (five) minutes as needed for chest pain. 25 tablet 12   ONETOUCH VERIO test strip USE AS DIRECTED TWICE A DAY 50 strip 12   pantoprazole (PROTONIX) 40 MG tablet Take 40 mg by mouth daily as needed (acid reflux).     PARoxetine (PAXIL) 20 MG tablet TAKE 2&1/2 TABLETS BY MOUTH DAILY 90 tablet 1   potassium chloride SA (KLOR-CON M20) 20 MEQ tablet Take 1 tablet (20 mEq total) by mouth daily.     tamsulosin (FLOMAX) 0.4 MG CAPS capsule Take 1 capsule (0.4 mg  total) by mouth daily as needed (repeat kidney stone).     traZODone (DESYREL) 50 MG tablet TAKE 1 TABLET BY MOUTH EVERYDAY AT BEDTIME 90 tablet 3   triamcinolone cream (KENALOG) 0.1 %  Apply 1 application topically 2 (two) times daily as needed (irritation).     umeclidinium-vilanterol (ANORO ELLIPTA) 62.5-25 MCG/INH AEPB Inhale 1 puff into the lungs daily.     Current Facility-Administered Medications  Medication Dose Route Frequency Provider Last Rate Last Admin   sodium chloride flush (NS) 0.9 % injection 3 mL  3 mL Intravenous Q12H Crenshaw, Denice Bors, MD        ROS:   General:  No weight loss, Fever, chills  HEENT: No recent headaches, no nasal bleeding, no visual changes, no sore throat  Neurologic: No dizziness, blackouts, seizures. No recent symptoms of stroke or mini- stroke. No recent episodes of slurred speech, or temporary blindness.  Cardiac: No recent episodes of chest pain/pressure, + shortness of breath at rest.  No shortness of breath with exertion.  + history of atrial fibrillation or irregular heartbeat  Vascular: No history of rest pain in feet.  No history of claudication.  No history of non-healing ulcer, No history of DVT + edema  Pulmonary: No home oxygen, no productive cough, no hemoptysis,  No asthma or wheezing  Musculoskeletal:  [ ]  Arthritis, [ x] Low back pain,  [ ]  Joint pain  Hematologic:No history of hypercoagulable state.  No history of easy bleeding.  No history of anemia  Gastrointestinal: No hematochezia or melena,  No gastroesophageal reflux, no trouble swallowing  Urinary: [ ]  chronic Kidney disease, [ ]  on HD - [ ]  MWF or [ ]  TTHS, [ ]  Burning with urination, [ ]  Frequent urination, [ ]  Difficulty urinating;   Skin: No rashes  Psychological: No history of anxiety,  No history of depression   Physical Examination  There were no vitals filed for this visit.  There is no height or weight on file to calculate BMI.  General:  Alert and oriented, no acute distress HEENT: Normal Neck: No bruit or JVD Pulmonary: Clear to auscultation bilaterally Cardiac: Regular Rate and Rhythm without murmur Gastrointestinal: Soft,  non-tender, non-distended, no mass, no scars Skin: No rash Extremity Pulses:  2+ radial, brachial, femoral, well perfused without ischemic changes non palpable dorsalis pedis, posterior tibial  bilaterally Musculoskeletal: positive pitting edema  Neurologic: Upper and lower extremity motor grossly and symmetric  DATA:      Abdominal Aorta Findings:  +-----------+-------+----------+----------+--------+--------+--------+  Location   AP (cm)Trans (cm)PSV (cm/s)WaveformThrombusComments  +-----------+-------+----------+----------+--------+--------+--------+  Proximal   2.14   2.80      120                                 +-----------+-------+----------+----------+--------+--------+--------+  Mid        1.87   2.07      139                                 +-----------+-------+----------+----------+--------+--------+--------+  Distal     2.19   2.25                                          +-----------+-------+----------+----------+--------+--------+--------+  RT CIA Prox  209                                 +-----------+-------+----------+----------+--------+--------+--------+  RT CIA Mid 2.1    2.2       203                                 +-----------+-------+----------+----------+--------+--------+--------+    Summary:  Abdominal Aorta: No evidence of an abdominal aortic aneurysm was  visualized. There is evidence of abnormal dilation of the Right Common  Iliac artery. This is a slight increase since previous exam.  Limited study due to body habitus and abdominal gas.      ASSESSMENT/PLAN:  AsymptomaticAAA largest diameter 2.8 and right CIA 2.2 No significant change in diameters. He will have his aortic valve replaced 09/19/2021 and hopefully he can get off the O2 and start a walking program.  He sleeps in a recliner and has develops increased edema.   F/U with [facility] in 2 years for AAA/iliac  duplex.      Roxy Horseman PA-C [facility] [place] Office: [phone]  MD in clinic [place]

## 2021-08-31 ENCOUNTER — Ambulatory Visit (INDEPENDENT_AMBULATORY_CARE_PROVIDER_SITE_OTHER): Payer: Medicare Other | Admitting: Behavioral Health

## 2021-08-31 ENCOUNTER — Other Ambulatory Visit: Payer: Self-pay

## 2021-08-31 ENCOUNTER — Encounter: Payer: Self-pay | Admitting: Behavioral Health

## 2021-08-31 DIAGNOSIS — F411 Generalized anxiety disorder: Secondary | ICD-10-CM

## 2021-08-31 MED ORDER — BUSPIRONE HCL 15 MG PO TABS: ORAL_TABLET | ORAL | 3 refills | Status: AC

## 2021-08-31 MED ORDER — ALPRAZOLAM 0.5 MG PO TABS: 0.5000 mg | ORAL_TABLET | Freq: Two times a day (BID) | ORAL | 2 refills | Status: AC | PRN

## 2021-08-31 NOTE — Progress Notes (Signed)
[facility] Med Check  Patient ID: Jericho, Alcorn  MRN: [id number]  PCP: Burnis Medin, MD  Date of Evaluation: 08/31/2021 Time spent:30 minutes  Chief Complaint:  Chief Complaint   Anxiety; Follow-up; Medication Refill     HISTORY/CURRENT STATUS: HPI 70 year male presents to this office today for follow up and medication management. He has noticed improvement with anxiety with the Buspar but he is ready to try taking 15 mg twice daily. He believes that he has got used to the medication and feel like he will be less fatigued. He will be undergoing open heart surgery on 09/28/2021 to repair aortic valve.  He has some anxiety thinking about that but he is hoping it helps his breathing, and allows him to come off oxygen.  He reports anxiety today at 3/10 and depression at 0/10. Agrees to f/u in 3 months to reassess. Denies mania, no psychosis. No SI/HI.     No prior psychiatric medication failures.       Individual Medical History/ Review of Systems: Changes? :No   Allergies: Pseudoephedrine, Diltiazem, Novocain [procaine], Quinolones, and Sulfonamide derivatives  Current Medications:  Current Outpatient Medications:    acetaminophen (TYLENOL) 325 MG tablet, Take 650 mg by mouth every 6 (six) hours as needed for mild pain., Disp: , Rfl:    albuterol (PROVENTIL HFA;VENTOLIN HFA) 108 (90 Base) MCG/ACT inhaler, Inhale 2 puffs into the lungs every 6 (six) hours as needed for wheezing or shortness of breath., Disp: 1 Inhaler, Rfl: 0   ALPRAZolam (XANAX) 0.5 MG tablet, Take 1 tablet (0.5 mg total) by mouth 2 (two) times daily as needed for anxiety., Disp: 30 tablet, Rfl: 2   apixaban (ELIQUIS) 5 MG TABS tablet, Take 1 tablet (5 mg total) by mouth 2 (two) times daily., Disp: 180 tablet, Rfl: 3   atorvastatin (LIPITOR) 80 MG tablet, Take 1 tablet (80 mg total) by mouth daily., Disp: , Rfl:    Blood Glucose Monitoring Suppl (ONETOUCH VERIO) w/Device KIT, Use to test blood sugars 1-2  times daily., Disp: 1 kit, Rfl: 1   brimonidine (ALPHAGAN) 0.15 % ophthalmic solution, Place 1 drop into both eyes 3 (three) times daily., Disp: , Rfl:    busPIRone (BUSPAR) 10 MG tablet, Take 1 tablet (10 mg total) by mouth 2 (two) times daily., Disp: 180 tablet, Rfl: 1   busPIRone (BUSPAR) 15 MG tablet, Take one tablet twice daily., Disp: 30 tablet, Rfl: 3   Cholecalciferol (VITAMIN D) 50 MCG (2000 UT) tablet, Take 2,000-4,000 Units by mouth See admin instructions. Take 2000 units by mouth in the morning and 4000 units in the evening, Disp: , Rfl:    COVID-19 mRNA Vac-TriS, Pfizer, (PFIZER-BIONT COVID-19 VAC-TRIS) SUSP injection, Inject into the muscle., Disp: 0.3 mL, Rfl: 0   diphenhydrAMINE (BENADRYL) 25 MG tablet, Take 25-50 mg by mouth at bedtime as needed for sleep. , Disp: , Rfl:    dofetilide (TIKOSYN) 500 MCG capsule, Take 1 capsule (500 mcg total) by mouth 2 (two) times daily., Disp: , Rfl:    famotidine (PEPCID) 10 MG tablet, Take 10 mg by mouth daily as needed for heartburn., Disp: , Rfl:    fexofenadine (ALLEGRA) 180 MG tablet, Take 180 mg by mouth daily as needed for allergies. , Disp: , Rfl:    fish oil-omega-3 fatty acids 1000 MG capsule, Take 1 g by mouth 2 (two) times daily., Disp: , Rfl:    fluticasone (FLONASE) 50 MCG/ACT nasal spray, Place 2 sprays into both  nostrils daily., Disp: , Rfl:    hydrALAZINE (APRESOLINE) 100 MG tablet, Take 1 tablet (100 mg total) by mouth 3 (three) times daily., Disp: 270 tablet, Rfl: 3   Lancets (ONETOUCH ULTRASOFT) lancets, Twice daily, Disp: 100 each, Rfl: 6   losartan (COZAAR) 100 MG tablet, Take 1 tablet (100 mg total) by mouth daily., Disp: 90 tablet, Rfl: 3   Magnesium 250 MG TABS, Take 250-500 mg by mouth See admin instructions. Takes 250 mg by mouth in the morning and 500 mg in the evening, Disp: , Rfl:    metFORMIN (GLUCOPHAGE-XR) 500 MG 24 hr tablet, Take 1 tablet (500 mg total) by mouth in the morning, at noon, and at bedtime., Disp: ,  Rfl:    methocarbamol (ROBAXIN) 500 MG tablet, TAKE 1 TABLET BY MOUTH 4 TIMES A DAY AS NEEDED FOR MUSCLE SPASM (Patient taking differently: Take 500 mg by mouth 4 (four) times daily as needed for muscle spasms.), Disp: 120 tablet, Rfl: 2   MULTIPLE VITAMIN PO, Take 1 tablet by mouth every evening., Disp: , Rfl:    nitroGLYCERIN (NITROSTAT) 0.4 MG SL tablet, Place 1 tablet (0.4 mg total) under the tongue every 5 (five) minutes as needed for chest pain., Disp: 25 tablet, Rfl: 12   ONETOUCH VERIO test strip, USE AS DIRECTED TWICE A DAY, Disp: 50 strip, Rfl: 12   pantoprazole (PROTONIX) 40 MG tablet, Take 40 mg by mouth daily as needed (acid reflux)., Disp: , Rfl:    PARoxetine (PAXIL) 20 MG tablet, TAKE 2&1/2 TABLETS BY MOUTH DAILY (Patient taking differently: Take 60 mg by mouth daily.), Disp: 90 tablet, Rfl: 1   potassium chloride SA (KLOR-CON M20) 20 MEQ tablet, Take 1 tablet (20 mEq total) by mouth daily., Disp: , Rfl:    tamsulosin (FLOMAX) 0.4 MG CAPS capsule, Take 1 capsule (0.4 mg total) by mouth daily as needed (repeat kidney stone)., Disp: , Rfl:    traZODone (DESYREL) 50 MG tablet, TAKE 1 TABLET BY MOUTH EVERYDAY AT BEDTIME (Patient taking differently: Take 50 mg by mouth at bedtime.), Disp: 90 tablet, Rfl: 3   triamcinolone cream (KENALOG) 0.1 %, Apply 1 application topically 2 (two) times daily as needed (irritation)., Disp: , Rfl:    umeclidinium-vilanterol (ANORO ELLIPTA) 62.5-25 MCG/INH AEPB, Inhale 1 puff into the lungs daily., Disp: , Rfl:   Current Facility-Administered Medications:    sodium chloride flush (NS) 0.9 % injection 3 mL, 3 mL, Intravenous, Q12H, Crenshaw, Denice Bors, MD Medication Side Effects: none  Family Medical/ Social History: Changes? No  MENTAL HEALTH EXAM:  There were no vitals taken for this visit.There is no height or weight on file to calculate BMI.  General Appearance: Casual and Neat  Eye Contact:  Good  Speech:  Clear and Coherent  Volume:  Normal   Mood:  Anxious  Affect:  Appropriate and Anxious  Thought Process:  Coherent  Orientation:  Full (Time, Place, and Person)  Thought Content: Logical   Suicidal Thoughts:  No  Homicidal Thoughts:  No  Memory:  WNL  Judgement:  Good  Insight:  Good  Psychomotor Activity:  Normal  Concentration:  Concentration: Good  Recall:  Good  Fund of Knowledge: Good  Language: Good  Assets:  Desire for Improvement  ADL's:  Intact  Cognition: WNL  Prognosis:  Good    DIAGNOSES:    ICD-10-CM   1. Generalized anxiety disorder  F41.1 busPIRone (BUSPAR) 15 MG tablet    ALPRAZolam (XANAX) 0.5 MG tablet  Receiving Psychotherapy: No    RECOMMENDATIONS:   Continue on Paxil 50 mg daily Continue Alprazolam 0.5 mg two times daily PRN Continue trazodone 50 mg at bedtime Restart Buspar 10 mg twice daily Will notify this office if condition worsens or experiencing side effects Recommended psychotherapy To follow up in 3 months to reassess anxiety levels Greater than 50% of  30 min face to face time with patient was spent on counseling and coordination of care. We discussed his pending open heart surgery and anxiety surrounding. Patient felt that medication was working and desires to restart. Discussed long term plan of care. We discussed patients pending worry over procedure or possible aortic valve surgery. Advised patient to check with surgery about taking medications especially xanax prior to and after the procedure. Patient agrees to call if he needs assistance before next follow up.       Elwanda [place], NP

## 2021-08-31 NOTE — Pre-Procedure Instructions (Addendum)
Surgical Instructions    Your procedure is scheduled on Thursday 09/01/2021.   Report to [facility] Main Entrance "A" at 07:15 A.M., then check in with the Admitting office.  Call this number if you have problems the morning of surgery:  [phone]   If you have any questions prior to your surgery date call [phone]: Open Monday-Friday 8am-4pm    Remember:  Do not eat  or drink after midnight the night before your surgery   Take these medicines the morning of surgery with A SIP OF WATER  atorvastatin (LIPITOR)  brimonidine (ALPHAGAN)  busPIRone (BUSPAR)  dofetilide (TIKOSYN)  famotidine (PEPCID)  fluticasone (FLONASE)  PARoxetine (PAXIL)  umeclidinium-vilanterol (ANORO ELLIPTA)    Take these medicines if needed:   acetaminophen (TYLENOL)  albuterol (PROVENTIL HFA;VENTOLIN HFA)  ALPRAZolam (XANAX)   methocarbamol (ROBAXIN)  nitroGLYCERIN (NITROSTAT)  pantoprazole (PROTONIX)   tamsulosin (FLOMAX)    As of today, STOP taking any Aspirin (unless otherwise instructed by your surgeon) Aleve, Naproxen, Ibuprofen, Motrin, Advil, Goody's, BC's, all herbal medications, fish oil, and all vitamins.  Please follow your surgeon's instructions regarding apixaban (ELIQUIS). If you have not received instructions then you need to contact your surgeon's office.   WHAT DO I DO ABOUT MY DIABETES MEDICATION?   Do not take oral diabetes medicines (pills) the morning of surgery.         DO NOT TAKE metFORMIN (GLUCOPHAGE-XR) the morning of surgery.   HOW TO MANAGE YOUR DIABETES BEFORE AND AFTER SURGERY  Why is it important to control my blood sugar before and after surgery? Improving blood sugar levels before and after surgery helps healing and can limit problems. A way of improving blood sugar control is eating a healthy diet by:  Eating less sugar and carbohydrates  Increasing activity/exercise  Talking with your doctor about reaching your blood sugar goals High blood sugars  (greater than 180 mg/dL) can raise your risk of infections and slow your recovery, so you will need to focus on controlling your diabetes during the weeks before surgery. Make sure that the doctor who takes care of your diabetes knows about your planned surgery including the date and location.  How do I manage my blood sugar before surgery? Check your blood sugar at least 4 times a day, starting 2 days before surgery, to make sure that the level is not too high or low.  Check your blood sugar the morning of your surgery when you wake up and every 2 hours until you get to the Short Stay unit.  If your blood sugar is less than 70 mg/dL, you will need to treat for low blood sugar: Do not take insulin. Treat a low blood sugar (less than 70 mg/dL) with  cup of clear juice (cranberry or apple), 4 glucose tablets, OR glucose gel. Recheck blood sugar in 15 minutes after treatment (to make sure it is greater than 70 mg/dL). If your blood sugar is not greater than 70 mg/dL on recheck, call [phone] for further instructions. Report your blood sugar to the short stay nurse when you get to Short Stay.  If you are admitted to the hospital after surgery: Your blood sugar will be checked by the staff and you will probably be given insulin after surgery (instead of oral diabetes medicines) to make sure you have good blood sugar levels. The goal for blood sugar control after surgery is 80-180 mg/dL.           Do not wear jewelry or  makeup Do not wear lotions, powders, perfumes/colognes, or deodorant. Do not shave 48 hours prior to surgery.  Men may shave face and neck. Do not bring valuables to the hospital. DO Not wear nail polish, gel polish, artificial nails, or any other type of covering on natural nails including finger and toenails. If patients have artificial nails, gel coating, etc. that need to be removed by a nail salon please have this removed prior to surgery or surgery may need to be  canceled/delayed if the surgeon/ anesthesia feels like the patient is unable to be adequately monitored.             [facility] is not responsible for any belongings or valuables.  Do NOT Smoke (Tobacco/Vaping)  24 hours prior to your procedure If you use a CPAP at night, you may bring all equipment for your overnight stay.   Contacts, glasses, dentures or bridgework may not be worn into surgery, please bring cases for these belongings   For patients admitted to the hospital, discharge time will be determined by your treatment team.   Patients discharged the day of surgery will not be allowed to drive home, and someone needs to stay with them for 24 hours.  ONLY 1 SUPPORT PERSON MAY BE PRESENT WHILE YOU ARE IN SURGERY. IF YOU ARE TO BE ADMITTED ONCE YOU ARE IN YOUR ROOM YOU WILL BE ALLOWED TWO (2) VISITORS.  Minor children may have two parents present. Special consideration for safety and communication needs will be reviewed on a case by case basis.  Special instructions:    Oral Hygiene is also important to reduce your risk of infection.  Remember - BRUSH YOUR TEETH THE MORNING OF SURGERY WITH YOUR REGULAR TOOTHPASTE   [facility]- Preparing For Surgery  Before surgery, you can play an important role. Because skin is not sterile, your skin needs to be as free of germs as possible. You can reduce the number of germs on your skin by washing with CHG (chlorahexidine gluconate) Soap before surgery.  CHG is an antiseptic cleaner which kills germs and bonds with the skin to continue killing germs even after washing.     Please do not use if you have an allergy to CHG or antibacterial soaps. If your skin becomes reddened/irritated stop using the CHG.  Do not shave (including legs and underarms) for at least 48 hours prior to first CHG shower. It is OK to shave your face.  Please follow these instructions carefully.     Shower the NIGHT BEFORE SURGERY and the MORNING OF SURGERY with CHG  Soap.   If you chose to wash your hair, wash your hair first as usual with your normal shampoo. After you shampoo, rinse your hair and body thoroughly to remove the shampoo.  Then [employer] and genitals (private parts) with your normal soap and rinse thoroughly to remove soap.  After that Use CHG Soap as you would any other liquid soap. You can apply CHG directly to the skin and wash gently with a scrungie or a clean washcloth.   Apply the CHG Soap to your body ONLY FROM THE NECK DOWN.  Do not use on open wounds or open sores. Avoid contact with your eyes, ears, mouth and genitals (private parts). Wash Face and genitals (private parts)  with your normal soap.   Wash thoroughly, paying special attention to the area where your surgery will be performed.  Thoroughly rinse your body with warm water from the neck down.  DO NOT shower/wash with your normal soap after using and rinsing off the CHG Soap.  Pat yourself dry with a CLEAN TOWEL.  Wear CLEAN PAJAMAS to bed the night before surgery  Place CLEAN SHEETS on your bed the night before your surgery  DO NOT SLEEP WITH PETS.   Day of Surgery:  Take a shower with CHG soap. Wear Clean/Comfortable clothing the morning of surgery Do not apply any deodorants/lotions.   Remember to brush your teeth WITH YOUR REGULAR TOOTHPASTE.   Please read over the following fact sheets that you were given.

## 2021-09-04 ENCOUNTER — Encounter (HOSPITAL_COMMUNITY)
Admission: RE | Admit: 2021-09-04 | Discharge: 2021-09-04 | Disposition: A | Discharge status: Home / Self Care | Payer: Medicare Other | Source: Ambulatory Visit | Attending: Surgery | Admitting: Surgery

## 2021-09-04 ENCOUNTER — Ambulatory Visit (HOSPITAL_BASED_OUTPATIENT_CLINIC_OR_DEPARTMENT_OTHER)
Admission: RE | Admit: 2021-09-04 | Discharge: 2021-09-04 | Disposition: A | Discharge status: Home / Self Care | Payer: Medicare Other | Source: Ambulatory Visit | Attending: Surgery | Admitting: Surgery

## 2021-09-04 ENCOUNTER — Other Ambulatory Visit: Payer: Self-pay

## 2021-09-04 ENCOUNTER — Encounter (HOSPITAL_COMMUNITY): Payer: Self-pay

## 2021-09-04 ENCOUNTER — Ambulatory Visit (HOSPITAL_COMMUNITY)
Admission: RE | Admit: 2021-09-04 | Discharge: 2021-09-04 | Disposition: A | Discharge status: Home / Self Care | Payer: Medicare Other | Source: Ambulatory Visit | Attending: Surgery | Admitting: Surgery

## 2021-09-04 DIAGNOSIS — I491 Atrial premature depolarization: Secondary | ICD-10-CM | POA: Insufficient documentation

## 2021-09-04 DIAGNOSIS — Z20822 Contact with and (suspected) exposure to covid-19: Secondary | ICD-10-CM | POA: Insufficient documentation

## 2021-09-04 DIAGNOSIS — E785 Hyperlipidemia, unspecified: Secondary | ICD-10-CM | POA: Insufficient documentation

## 2021-09-04 DIAGNOSIS — I351 Nonrheumatic aortic (valve) insufficiency: Secondary | ICD-10-CM

## 2021-09-04 DIAGNOSIS — Z01818 Encounter for other preprocedural examination: Secondary | ICD-10-CM

## 2021-09-04 DIAGNOSIS — I440 Atrioventricular block, first degree: Secondary | ICD-10-CM | POA: Insufficient documentation

## 2021-09-04 DIAGNOSIS — R0602 Shortness of breath: Secondary | ICD-10-CM | POA: Diagnosis not present

## 2021-09-04 DIAGNOSIS — I350 Nonrheumatic aortic (valve) stenosis: Secondary | ICD-10-CM | POA: Diagnosis not present

## 2021-09-04 HISTORY — DX: Cardiac murmur, unspecified: R01.1

## 2021-09-04 HISTORY — DX: Anxiety disorder, unspecified: F41.9

## 2021-09-04 HISTORY — DX: Essential (primary) hypertension: I10

## 2021-09-04 HISTORY — DX: Dyspnea, unspecified: R06.00

## 2021-09-04 LAB — COMPREHENSIVE METABOLIC PANEL
ALT: 32 U/L (ref 0–44)
AST: 26 U/L (ref 15–41)
Albumin: 3.8 g/dL (ref 3.5–5.0)
Alkaline Phosphatase: 50 U/L (ref 38–126)
Anion gap: 10 (ref 5–15)
BUN: 28 mg/dL — ABNORMAL HIGH (ref 8–23)
CO2: 20 mmol/L — ABNORMAL LOW (ref 22–32)
Calcium: 9.2 mg/dL (ref 8.9–10.3)
Chloride: 107 mmol/L (ref 98–111)
Creatinine, Ser: 1.44 mg/dL — ABNORMAL HIGH (ref 0.61–1.24)
GFR, Estimated: 52 mL/min — ABNORMAL LOW (ref >60)
Glucose, Bld: 126 mg/dL — ABNORMAL HIGH (ref 70–99)
Potassium: 4.6 mmol/L (ref 3.5–5.1)
Sodium: 137 mmol/L (ref 135–145)
Total Bilirubin: 1.5 mg/dL — ABNORMAL HIGH (ref 0.3–1.2)
Total Protein: 6.4 g/dL — ABNORMAL LOW (ref 6.5–8.1)

## 2021-09-04 LAB — URINALYSIS, ROUTINE W REFLEX MICROSCOPIC
Bacteria, UA: NONE SEEN
Bilirubin Urine: NEGATIVE
Glucose, UA: NEGATIVE mg/dL
Hgb urine dipstick: NEGATIVE
Ketones, ur: NEGATIVE mg/dL
Leukocytes,Ua: NEGATIVE
Nitrite: NEGATIVE
Protein, ur: 100 mg/dL — AB
Specific Gravity, Urine: 1.016 (ref 1.005–1.030)
pH: 6 (ref 5.0–8.0)

## 2021-09-04 LAB — BLOOD GAS, ARTERIAL
Acid-base deficit: 0.9 mmol/L (ref 0.0–2.0)
Bicarbonate: 23.2 mmol/L (ref 20.0–28.0)
Drawn by: 602861
FIO2: 21
O2 Saturation: 91.5 %
Patient temperature: 37
pCO2 arterial: 38 mmHg (ref 32.0–48.0)
pH, Arterial: 7.403 (ref 7.350–7.450)
pO2, Arterial: 64.3 mmHg — ABNORMAL LOW (ref 83.0–108.0)

## 2021-09-04 LAB — CBC
HCT: 40.2 % (ref 39.0–52.0)
Hemoglobin: 12.9 g/dL — ABNORMAL LOW (ref 13.0–17.0)
MCH: 29.8 pg (ref 26.0–34.0)
MCHC: 32.1 g/dL (ref 30.0–36.0)
MCV: 92.8 fL (ref 80.0–100.0)
Platelets: 157 10*3/uL (ref 150–400)
RBC: 4.33 MIL/uL (ref 4.22–5.81)
RDW: 15.2 % (ref 11.5–15.5)
WBC: 6.9 10*3/uL (ref 4.0–10.5)
nRBC: 0 % (ref 0.0–0.2)

## 2021-09-04 LAB — SURGICAL PCR SCREEN
MRSA, PCR: NEGATIVE
Staphylococcus aureus: NEGATIVE

## 2021-09-04 LAB — PROTIME-INR
INR: 1.2 (ref 0.8–1.2)
Prothrombin Time: 15.4 seconds — ABNORMAL HIGH (ref 11.4–15.2)

## 2021-09-04 LAB — HEMOGLOBIN A1C
Hgb A1c MFr Bld: 6.5 % — ABNORMAL HIGH (ref 4.8–5.6)
Mean Plasma Glucose: 139.85 mg/dL

## 2021-09-04 LAB — GLUCOSE, CAPILLARY: Glucose-Capillary: 124 mg/dL — ABNORMAL HIGH (ref 70–99)

## 2021-09-04 LAB — APTT: aPTT: 32 seconds (ref 24–36)

## 2021-09-04 LAB — SARS CORONAVIRUS 2 (TAT 6-24 HRS): SARS Coronavirus 2: NEGATIVE

## 2021-09-04 NOTE — Progress Notes (Signed)
PCP - Shanon Ace Cardiologist - Dr. Stanford Breed  PPM/ICD - n/a Device Orders - n/a Rep Notified - n/a  Chest x-ray - 09/04/21 EKG - 09/04/21 Stress Test - 2013 ECHO - 05/25/21 Cardiac Cath - 07/16/21  Sleep Study - 2003 CPAP - Yes. OSA. Wears nightly  CBG today- 124 Checks Blood Sugar twice a day Range 115-120  Blood Thinner Instructions: Patient states he was instructed to stop taking apixaban Arne [place]) on Saturday. Last dose was 09/01/21  ERAS Protcol - NPO after midnight PRE-SURGERY Ensure or G2- n/a  COVID TEST- 09/04/21 at PAT appointment. Pending.    Anesthesia review: Yes.   Patient denies shortness of breath, fever, cough and chest pain at PAT appointment   All instructions explained to the patient, with a verbal understanding of the material. Patient agrees to go over the instructions while at home for a better understanding. Patient also instructed to self quarantine after being tested for COVID-19. The opportunity to ask questions was provided.

## 2021-09-04 NOTE — Progress Notes (Signed)
Pre-CABG (AVR) Dopplers completed. Refer to "CV Proc" under chart review to view preliminary results.  09/04/2021 10:53 AM Kelby Aline., MHA, RVT, RDCS, RDMS

## 2021-09-05 MED ORDER — SODIUM CHLORIDE 0.9 % IV SOLN
INTRAVENOUS | Status: DC
  Filled 2021-09-05: qty 30

## 2021-09-05 MED ORDER — TRANEXAMIC ACID 1000 MG/10ML IV SOLN
1.5000 mg/kg/h | INTRAVENOUS | Status: AC
  Administered 2021-09-06 (×2): 1.5 mg/kg/h via INTRAVENOUS
  Filled 2021-09-05: qty 25

## 2021-09-05 MED ORDER — TRANEXAMIC ACID (OHS) BOLUS VIA INFUSION
15.0000 mg/kg | INTRAVENOUS | Status: AC
  Administered 2021-09-06: 2049 mg via INTRAVENOUS
  Filled 2021-09-05: qty 2049

## 2021-09-05 MED ORDER — TRANEXAMIC ACID (OHS) PUMP PRIME SOLUTION
2.0000 mg/kg | INTRAVENOUS | Status: DC
  Filled 2021-09-05: qty 2.73

## 2021-09-05 MED ORDER — NITROGLYCERIN IN D5W 200-5 MCG/ML-% IV SOLN
2.0000 ug/min | INTRAVENOUS | Status: DC
  Filled 2021-09-05: qty 250

## 2021-09-05 MED ORDER — INSULIN REGULAR(HUMAN) IN NACL 100-0.9 UT/100ML-% IV SOLN
INTRAVENOUS | Status: AC
  Administered 2021-09-06: 3 [IU]/h via INTRAVENOUS
  Filled 2021-09-05: qty 100

## 2021-09-05 MED ORDER — EPINEPHRINE HCL 5 MG/250ML IV SOLN IN NS
0.0000 ug/min | INTRAVENOUS | Status: AC
  Administered 2021-09-06: 2 ug/min via INTRAVENOUS
  Filled 2021-09-05: qty 250

## 2021-09-05 MED ORDER — POTASSIUM CHLORIDE 2 MEQ/ML IV SOLN
80.0000 meq | INTRAVENOUS | Status: DC
  Filled 2021-09-05: qty 40

## 2021-09-05 MED ORDER — CEFAZOLIN SODIUM-DEXTROSE 2-4 GM/100ML-% IV SOLN
2.0000 g | INTRAVENOUS | Status: AC
  Administered 2021-09-06: 2 g via INTRAVENOUS
  Filled 2021-09-05: qty 100

## 2021-09-05 MED ORDER — CEFAZOLIN IN SODIUM CHLORIDE 3-0.9 GM/100ML-% IV SOLN
3.0000 g | INTRAVENOUS | Status: AC
  Administered 2021-09-06: 3 g via INTRAVENOUS
  Filled 2021-09-05 (×2): qty 100

## 2021-09-05 MED ORDER — PHENYLEPHRINE HCL-NACL 20-0.9 MG/250ML-% IV SOLN
30.0000 ug/min | INTRAVENOUS | Status: AC
  Administered 2021-09-06: 25 ug/min via INTRAVENOUS
  Filled 2021-09-05: qty 250

## 2021-09-05 MED ORDER — MILRINONE LACTATE IN DEXTROSE 20-5 MG/100ML-% IV SOLN
0.3000 ug/kg/min | INTRAVENOUS | Status: AC
  Administered 2021-09-06: .2 ug/kg/min via INTRAVENOUS
  Filled 2021-09-05: qty 100

## 2021-09-05 MED ORDER — MAGNESIUM SULFATE 50 % IJ SOLN
40.0000 meq | INTRAMUSCULAR | Status: DC
  Filled 2021-09-05: qty 9.85

## 2021-09-05 MED ORDER — NOREPINEPHRINE 4 MG/250ML-% IV SOLN
0.0000 ug/min | INTRAVENOUS | Status: AC
  Administered 2021-09-06: 2 ug/min via INTRAVENOUS
  Filled 2021-09-05: qty 250

## 2021-09-05 MED ORDER — PLASMA-LYTE A IV SOLN
INTRAVENOUS | Status: DC
  Filled 2021-09-05: qty 5

## 2021-09-05 MED ORDER — DEXMEDETOMIDINE HCL IN NACL 400 MCG/100ML IV SOLN
0.1000 ug/kg/h | INTRAVENOUS | Status: AC
  Administered 2021-09-06 (×2): .7 ug/kg/h via INTRAVENOUS
  Filled 2021-09-05: qty 100

## 2021-09-05 MED ORDER — VANCOMYCIN HCL 1500 MG/300ML IV SOLN
1500.0000 mg | INTRAVENOUS | Status: AC
  Administered 2021-09-06: 1500 mg via INTRAVENOUS
  Filled 2021-09-05: qty 300

## 2021-09-05 NOTE — H&P (Signed)
[address]       [place] [address]             [phone]      Cardiothoracic Surgery Admission History and Physical   PCP is Panosh, Standley Brooking, MD Referring Provider is Lelon Perla, MD       Chief Complaint  Patient presents with   Aortic Insuffiency Paroxysmal atrial fibrillation.           HPI:   The patient is a 70 year old gentleman with a history of paroxysmal atrial fibrillation status post multiple cardioversions in the past who has been maintained on Tikosyn, coronary disease status post stenting of his LAD in 2003, hyperlipidemia, OSA on CPAP, who had MSSA sepsis with development of lumbar discitis and an infected left sternoclavicular joint in 08/2019.  He underwent incision and drainage and debridement of left sternoclavicular joint with application of a wound VAC by Dr. Servando Snare on 08/31/2019.  He was treated with a long course of intravenous antibiotics followed by oral Keflex.  This resolved without difficulty.  A CT scan of the chest also showed a mildly dilated ascending aorta measuring 4.4 cm.  He had an echocardiogram on 08/29/2019 when he presented with sepsis and this showed a trileaflet aortic valve with mild sclerosis and trivial regurgitation.  There is no evidence of stenosis.  There is no vegetation on the aortic valve.  His left ventricular ejection fraction at that time was 60 to 65% with normal LV cavity size measured at about 5 cm during diastole.  He now presents with recent onset of exertional dyspnea and fatigue.  A 2D echocardiogram on 05/25/2021 showed moderate to severe aortic insufficiency with a pressure half-time of 307 ms.  Mean gradient across aortic valve was 10 mmHg and a peak gradient was 21.2 mmHg.  Left ventricular ejection fraction was 55 to 60% with new dilatation of the LV cavity measured at 6.5 cm during diastole and 5.1 cm during systole.  There was trivial mitral regurgitation and mild tricuspid regurgitation.   Right ventricular function was normal.  He subsequently had a TEE performed on 07/10/2021 which showed an ejection fraction of 55 to 60%.  There is mild dilatation of the ascending aorta at 4.3 cm.  There is severe aortic insufficiency with pressure half-time of 485 ms.  The aortic root diameter is measured at 3.6 cm and the ascending aorta measured at 4.2 cm.  He subsequently underwent cardiac catheterization showing that the previously placed mid LAD stent was widely patent.  There is moderate pulmonary hypertension and 67/20 with a mean of 39.  Mean pulmonary capillary wedge pressure was 17 mmHg.  There is mild diffuse nonobstructive coronary disease.   He is here today by himself.  He reports exertional shortness of breath and fatigue.  He has had shortness of breath at rest at times as well as some orthopnea.  He has had some lower extremity swelling in his ankles and feet.  He denies any dizziness or syncope.  He has not had a cardioversion in several years but has noticed some irregular rhythm that thought he was in atrial fibrillation.       Past Medical History:  Diagnosis Date   Atrial fibrillation ([place]) 03/22/2009    a. s/p multiple DCCV; b. no coumadin due to low TE risk profile; c. Tikosyn Rx   Coronary atherosclerosis of native coronary artery 11/2002    a. s/p stent to LAD 12/03;  OM2 occluded at cath 12/03; d. myoview 5/10: no ischemia;  e. echo 7/11: EF 55%, BAE, mild RVE, PASP 41-45; Myoview was in March 2013. 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[place])      2.6 to be evaluated incidental  finding on CT   LATERAL EPICONDYLITIS, LEFT 10/24/2009   LIVER FUNCTION TESTS, ABNORMAL 04/25/2010   Local reaction to immunization 05/05/2012    minor resolving  zostavax    Myocardial infarction [facility]) mi2003   Numbness in left leg      foot related to back disease and surgery   Obesity, unspecified 04/24/2009   Perforated appendicitis with necrosis s/p open appendectomy 06/07/14 06/04/2014   Renal cyst      Characterized by MRI as simple   Ruptured suppurative appendicitis      2015    SLEEP APNEA, OBSTRUCTIVE 03/22/2009    compliant with CPAP   THROMBOCYTOPENIA 08/16/2010   TOBACCO USE, QUIT 10/24/2009   ULNAR NEUROPATHY, LEFT 08/23/36   Umbilical hernia             Past Surgical History:  Procedure Laterality Date   APPENDECTOMY N/A 06/06/2014    Procedure: APPENDECTOMY;  Surgeon: Odis Hollingshead, MD;  Location: [facility];  Service: General;  Laterality: N/A;   COLON SURGERY       COLONOSCOPY   11/15/2011    Procedure: COLONOSCOPY;  Surgeon: Juanita Craver, MD;  Location: [facility];  Service: Endoscopy;  Laterality: N/A;   COLONOSCOPY WITH PROPOFOL N/A 01/03/2017    Procedure: COLONOSCOPY WITH PROPOFOL;  Surgeon: Carol Ada, MD;  Location: [facility];  Service: Endoscopy;  Laterality: N/A;   COLONOSCOPY WITH PROPOFOL N/A 05/26/2020    Procedure: COLONOSCOPY WITH PROPOFOL;  Surgeon: Carol Ada, MD;  Location: [facility];  Service: Endoscopy;  Laterality: N/A;   cyst on epiglottis   08/2002   ESOPHAGOGASTRODUODENOSCOPY   11/15/2011    Procedure: ESOPHAGOGASTRODUODENOSCOPY (EGD);  Surgeon: Juanita Craver, MD;  Location: [facility];  Service: Endoscopy;  Laterality: N/A;   LAPAROSCOPIC APPENDECTOMY N/A 06/06/2014    Procedure: APPENDECTOMY LAPAROSCOPIC attemted;  Surgeon: Odis Hollingshead, MD;  Location: [facility];  Service: General;  Laterality: N/A;   LUMBAR DISC SURGERY        two  holes in spinalcovering   POLYPECTOMY   05/26/2020    Procedure: POLYPECTOMY;  Surgeon: Carol Ada,  MD;  Location: [facility];  Service: Endoscopy;;   RIGHT/LEFT HEART CATH AND CORONARY ANGIOGRAPHY N/A 07/16/2021    Procedure: RIGHT/LEFT HEART CATH AND CORONARY ANGIOGRAPHY;  Surgeon: Jettie Booze, MD;  Location: [facility];  Service: Cardiovascular;  Laterality: N/A;   stent        LAD DUS 2004   STERNAL WOUND DEBRIDEMENT Left 08/31/2019    Procedure: LEFT STERNOCLAVICULAR WOUND DEBRIDEMENT WITH APPLICATION OF WOUND VAC;  Surgeon: Grace Isaac, MD;  Location: [facility];  Service: Thoracic;  Laterality: Left;   surgery l4-l5    1998    ruptured x  3   TEE WITHOUT CARDIOVERSION N/A 07/10/2021    Procedure: TRANSESOPHAGEAL ECHOCARDIOGRAM (TEE);  Surgeon: Lelon Perla, MD;  Location: [facility] ENDOSCOPY;  Service: Cardiovascular;  Laterality: N/A;   ulnar neuropathy       UMBILICAL HERNIA REPAIR        mesh           Family History  Problem Relation Age of Onset   Thyroid disease Mother     Ovarian cancer Mother     Breast cancer Mother     Lung cancer Father     Cancer Father        Social History Social History         Tobacco Use   Smoking status: Former      Packs/day: 2.00      Years: 42.00      Pack years: 84.00      Types: Cigarettes      Quit date: 12/30/2008      Years since quitting: 12.6   Smokeless tobacco: Never   Tobacco comments:      started at age 79; 1-2 ppd; quit 2010  Vaping Use   Vaping Use: Never used  Substance Use Topics   Alcohol use: No      Alcohol/week: 0.0 standard drinks      Comment: rarely; maybe 1 beer a year   Drug use: No            Current Outpatient Medications  Medication Sig Dispense Refill   acetaminophen (TYLENOL) 325 MG tablet Take 650 mg by mouth every 6 (six) hours as needed for mild pain.       albuterol (PROVENTIL HFA;VENTOLIN HFA) 108 (90 Base) MCG/ACT inhaler Inhale 2 puffs into the lungs every 6 (six) hours as needed for wheezing or shortness of breath. 1 Inhaler 0   ALPRAZolam (XANAX) 0.5 MG tablet Take  1 tablet (0.5 mg total) by mouth 2 (two) times daily as needed for anxiety. 30 tablet 0   apixaban (ELIQUIS) 5 MG TABS tablet Take 1 tablet (5 mg total) by mouth 2 (two) times daily. 180 tablet 3   atorvastatin (LIPITOR) 80 MG tablet Take 1 tablet (80 mg total) by mouth daily.       Blood Glucose Monitoring Suppl (ONETOUCH VERIO) w/Device KIT Use to test blood sugars 1-2 times daily. 1 kit 1   brimonidine (ALPHAGAN) 0.15 % ophthalmic solution Place 1 drop into both eyes 3 (three) times daily.       busPIRone (BUSPAR) 10 MG tablet Take 1 tablet (10 mg total) by mouth 2 (two) times daily. 180 tablet 1   busPIRone (BUSPAR) 15 MG tablet TAKE 1/3 TABLET TWICE A DAY FOR 1 WEEK, THEN 2/3 TAB TWICE DAILY X1 WEEK, THEN 1 TAB TWICE A DAY 30 tablet 1   Cholecalciferol (VITAMIN D) 50 MCG (2000 UT) tablet Take 200-4,000 Units by mouth See admin instructions. Take 2000 units by mouth in the morning and 4000 units in the evening       COVID-19 mRNA Vac-TriS, Pfizer, (PFIZER-BIONT COVID-19 VAC-TRIS) SUSP injection Inject into the muscle. 0.3 mL 0   diphenhydrAMINE (BENADRYL) 25 MG tablet Take 25-50 mg by mouth at bedtime as needed for sleep.        dofetilide (TIKOSYN) 500 MCG capsule Take 1 capsule (500 mcg total) by mouth 2 (two) times daily.       famotidine (PEPCID) 10 MG tablet Take 10 mg by mouth as needed for heartburn.  fexofenadine (ALLEGRA) 180 MG tablet Take 180 mg by mouth daily as needed for allergies.        fish oil-omega-3 fatty acids 1000 MG capsule Take 1 g by mouth 2 (two) times daily.       fluticasone (FLONASE) 50 MCG/ACT nasal spray Place 2 sprays into both nostrils daily.       hydrALAZINE (APRESOLINE) 100 MG tablet Take 1 tablet (100 mg total) by mouth 3 (three) times daily. 270 tablet 3   Lancets (ONETOUCH ULTRASOFT) lancets Twice daily 100 each 6   losartan (COZAAR) 100 MG tablet Take 1 tablet (100 mg total) by mouth daily. 90 tablet 3   Magnesium 250 MG TABS Take 250-500 mg by  mouth See admin instructions. Takes 250 mg by mouth in the morning and 500 mg in the evening       metFORMIN (GLUCOPHAGE-XR) 500 MG 24 hr tablet Take 1 tablet (500 mg total) by mouth in the morning, at noon, and at bedtime.       methocarbamol (ROBAXIN) 500 MG tablet TAKE 1 TABLET BY MOUTH 4 TIMES A DAY AS NEEDED FOR MUSCLE SPASM (Patient taking differently: Take 500 mg by mouth 4 (four) times daily as needed for muscle spasms.) 120 tablet 2   MULTIPLE VITAMIN PO Take 1 tablet by mouth every evening.       nitroGLYCERIN (NITROSTAT) 0.4 MG SL tablet Place 1 tablet (0.4 mg total) under the tongue every 5 (five) minutes as needed for chest pain. 25 tablet 12   ONETOUCH VERIO test strip USE AS DIRECTED TWICE A DAY 50 strip 12   pantoprazole (PROTONIX) 40 MG tablet Take 40 mg by mouth daily as needed (acid reflux).       PARoxetine (PAXIL) 20 MG tablet TAKE 2&1/2 TABLETS BY MOUTH DAILY 90 tablet 1   potassium chloride SA (KLOR-CON M20) 20 MEQ tablet Take 1 tablet (20 mEq total) by mouth daily.       tamsulosin (FLOMAX) 0.4 MG CAPS capsule Take 1 capsule (0.4 mg total) by mouth daily as needed (repeat kidney stone).       traZODone (DESYREL) 50 MG tablet TAKE 1 TABLET BY MOUTH EVERYDAY AT BEDTIME 90 tablet 3   triamcinolone cream (KENALOG) 0.1 % Apply 1 application topically 2 (two) times daily as needed (irritation).       umeclidinium-vilanterol (ANORO ELLIPTA) 62.5-25 MCG/INH AEPB Inhale 1 puff into the lungs daily.                 Current Facility-Administered Medications  Medication Dose Route Frequency Provider Last Rate Last Admin   sodium chloride flush (NS) 0.9 % injection 3 mL  3 mL Intravenous Q12H Lelon Perla, MD               Allergies  Allergen Reactions   Pseudoephedrine Other (See Comments)      Patient went into afib   Diltiazem Rash and Itching      Also 2019   Novocain [Procaine] Hives      Dentist appointment in 1958; since then has tolerated lidocaine and provocaine  with no hives or difficulty.   Quinolones        Patient was warned about not using Cipro and similar antibiotics. Recent studies have raised concern that fluoroquinolone antibiotics could be associated with an increased risk of aortic aneurysm Fluoroquinolones have non-antimicrobial properties that might jeopardise the integrity of the extracellular matrix of the vascular wall In a  propensity score matched  cohort study in [place], there was a 66% increased rate of aortic aneurysm or dissection associated with oral fluoroquinolone use, compared wit   Sulfonamide Derivatives Other (See Comments)      Childhood reaction       Review of Systems  Constitutional:  Positive for activity change, chills and fatigue. Negative for fever.  HENT:  Positive for hearing loss.        Sees his dentist regularly  Eyes: Negative.   Respiratory:  Positive for shortness of breath. Negative for chest tightness.   Cardiovascular:  Positive for palpitations and leg swelling. Negative for chest pain.  Gastrointestinal: Negative.   Genitourinary:        Kidney stones  Musculoskeletal: Negative.   Skin: Negative.   Allergic/Immunologic: Negative.   Neurological:  Positive for numbness. Negative for dizziness and syncope.       Numbness in feet since lumbar surgery 1998  Hematological:  Bruises/bleeds easily.  Psychiatric/Behavioral:  The patient is nervous/anxious.     BP (!) 163/64 (BP Location: Right Arm, Patient Position: Sitting)   Pulse 65   Resp 20   Ht 6' 1" (1.854 m)   Wt 295 lb (133.8 kg)   SpO2 (!) 82% Comment: RA  BMI 38.92 kg/m  Physical Exam Constitutional:      Appearance: He is obese.  HENT:     Head: Normocephalic and atraumatic.  Eyes:     Extraocular Movements: Extraocular movements intact.     Conjunctiva/sclera: Conjunctivae normal.     Pupils: Pupils are equal, round, and reactive to light.  Cardiovascular:     Rate and Rhythm: Normal rate and regular rhythm.     Pulses:  Normal pulses.     Heart sounds: Murmur heard.     Comments: 2/6 systolic and diastolic murmur along the right lower sternal border Pulmonary:     Effort: Pulmonary effort is normal.     Breath sounds: Normal breath sounds.  Abdominal:     Tenderness: There is no abdominal tenderness.  Musculoskeletal:        General: Swelling present.     Cervical back: Normal range of motion and neck supple.  Skin:    General: Skin is warm and dry.  Neurological:     General: No focal deficit present.     Mental Status: He is alert and oriented to person, place, and time.  Psychiatric:        Mood and Affect: Mood normal.        Behavior: Behavior normal.        Diagnostic Tests:     ECHOCARDIOGRAM REPORT         Patient Name:   Anzel Kearse. Date of Exam: 05/25/2021  Medical Rec #:  [id number]          Height:       73.0 in  Accession #:    [id number]         Weight:       303.0 lb  Date of Birth:  [birth year]         BSA:          2.567 m  Patient Age:    26 years           BP:           132/50 mmHg  Patient Gender: M                  HR:  59 bpm.  Exam Location:  Outpatient   Procedure: 2D Echo   Indications:    Murmur     History:        Patient has prior history of Echocardiogram examinations,  most                  recent 08/29/2019. Previous Myocardial Infarction, Aortic                  Aneurysm, Arrythmias:Atrial Fibrillation; Risk  Factors:Diabetes.     Sonographer:    Mikki Santee RDCS  Referring Phys: [place]     1. Left ventricular ejection fraction, by estimation, is 55 to 60%. The  left ventricle has normal function. The left ventricle has no regional  wall motion abnormalities. The left ventricular internal cavity size was  severely dilated. Left ventricular  diastolic parameters are consistent with Grade I diastolic dysfunction  (impaired relaxation).   2. Right ventricular systolic function is normal. The right  ventricular  size is normal. There is normal pulmonary artery systolic pressure.   3. Left atrial size was moderately dilated.   4. Right atrial size was mildly dilated.   5. The mitral valve is normal in structure. Trivial mitral valve  regurgitation. No evidence of mitral stenosis. Severe mitral annular  calcification.   6. The aortic valve is tricuspid. Aortic valve regurgitation is moderate  to severe. Mild aortic valve stenosis.   7. Aortic dilatation noted. There is mild dilatation of the ascending  aorta, measuring 43 mm.   8. The inferior vena cava is dilated in size with >50% respiratory  variability, suggesting right atrial pressure of 8 mmHg.   FINDINGS   Left Ventricle: Left ventricular ejection fraction, by estimation, is 55  to 60%. The left ventricle has normal function. The left ventricle has no  regional wall motion abnormalities. The left ventricular internal cavity  size was severely dilated. There  is no left ventricular hypertrophy. Left ventricular diastolic parameters  are consistent with Grade I diastolic dysfunction (impaired relaxation).   Right Ventricle: The right ventricular size is normal.Right ventricular  systolic function is normal. There is normal pulmonary artery systolic  pressure. The tricuspid regurgitant velocity is 2.63 m/s, and with an  assumed right atrial pressure of 8 mmHg,  the estimated right ventricular systolic pressure is 38.2 mmHg.   Left Atrium: Left atrial size was moderately dilated.   Right Atrium: Right atrial size was mildly dilated.   Pericardium: There is no evidence of pericardial effusion.   Mitral Valve: The mitral valve is normal in structure. Severe mitral  annular calcification. Trivial mitral valve regurgitation. No evidence of  mitral valve stenosis.   Tricuspid Valve: The tricuspid valve is normal in structure. Tricuspid  valve regurgitation is mild . No evidence of tricuspid stenosis.   Aortic Valve: The  aortic valve is tricuspid. Aortic valve regurgitation is  moderate to severe. Aortic regurgitation PHT measures 307 msec. Mild  aortic stenosis is present. Aortic valve mean gradient measures 10.0 mmHg.  Aortic valve peak gradient measures   21.2 mmHg. Aortic valve area, by VTI measures 1.71 cm.   Pulmonic Valve: The pulmonic valve was normal in structure. Pulmonic valve  regurgitation is mild. No evidence of pulmonic stenosis.   Aorta: Aortic dilatation noted. There is mild dilatation of the ascending  aorta, measuring 43 mm.   Venous: The inferior vena cava is dilated in size with greater than 50%  respiratory variability, suggesting right atrial pressure of 8 mmHg.   IAS/Shunts: No atrial level shunt detected by color flow Doppler.      LEFT VENTRICLE  PLAX 2D  LVIDd:         6.50 cm  Diastology  LVIDs:         5.10 cm  LV e' medial:    7.72 cm/s  LV PW:         1.00 cm  LV E/e' medial:  10.1  LV IVS:        1.00 cm  LV e' lateral:   5.87 cm/s  LVOT diam:     2.30 cm  LV E/e' lateral: 13.2  LV SV:         90  LV SV Index:   35  LVOT Area:     4.15 cm      RIGHT VENTRICLE  RV S prime:     10.90 cm/s  TAPSE (M-mode): 2.4 cm   LEFT ATRIUM              Index       RIGHT ATRIUM           Index  LA diam:        4.60 cm  1.79 cm/m  RA Area:     30.10 cm  LA Vol (A2C):   132.0 ml 51.42 ml/m RA Volume:   106.00 ml 41.29 ml/m  LA Vol (A4C):   121.0 ml 47.13 ml/m  LA Biplane Vol: 134.0 ml 52.20 ml/m   AORTIC VALVE  AV Area (Vmax):    2.31 cm  AV Area (Vmean):   2.34 cm  AV Area (VTI):     1.71 cm  AV Vmax:           230.00 cm/s  AV Vmean:          145.000 cm/s  AV VTI:            0.528 m  AV Peak Grad:      21.2 mmHg  AV Mean Grad:      10.0 mmHg  LVOT Vmax:         128.00 cm/s  LVOT Vmean:        81.700 cm/s  LVOT VTI:          0.217 m  LVOT/AV VTI ratio: 0.41  AI PHT:            307 msec     AORTA  Ao Root diam: 3.50 cm  Ao Asc diam:  4.30 cm   MITRAL  VALVE               TRICUSPID VALVE  MV Area (PHT): 1.98 cm    TR Peak grad:   27.7 mmHg  MV Decel Time: 384 msec    TR Vmax:        263.00 cm/s  MV E velocity: 77.70 cm/s  MV A velocity: 98.50 cm/s  SHUNTS  MV E/A ratio:  0.79        Systemic VTI:  0.22 m                             Systemic Diam: 2.30 cm   [name] MD  Electronically signed by [name] MD  Signature Date/Time: 05/25/2021/12:09:22 PM         Final       TRANSESOPHOGEAL ECHO REPORT  Patient Name:   Myquan Schaumburg. Date of Exam: 07/10/2021  Medical Rec #:  [id number]          Height:       73.0 in  Accession #:    [id number]         Weight:       297.8 lb  Date of Birth:  08-29-1951         BSA:          2.548 m  Patient Age:    38 years           BP:           115/101 mmHg  Patient Gender: M                  HR:           52 bpm.  Exam Location:  Outpatient   Procedure: 3D Echo, Cardiac Doppler, Color Doppler and Transesophageal  Echo   Indications:     I35.1 Nonrheumatic aortic (valve) insufficiency     History:         Patient has prior history of Echocardiogram examinations,  most                   recent 06/06/2016. Previous Myocardial Infarction and CAD,                   Arrythmias:Atrial Fibrillation; Risk  Factors:Dyslipidemia,                   Former Smoker and Diabetes. Aneurysm.     Sonographer:     Jonelle Sidle Dance  Referring Phys:  [place]  Diagnosing Phys: [name] MD   PROCEDURE: The transesophogeal probe was passed without difficulty through  the esophogus of the patient. Sedation performed by different physician.  The patient was monitored while under deep sedation. Anesthestetic  sedation was provided intravenously by  Anesthesiology: 344.5m of Propofol, 1033mof Lidocaine. The patient  developed no complications during the procedure.   IMPRESSIONS     1. Dilated ascending aorta; severe, cental aortic insufficiency.   2. Left ventricular  ejection fraction, by estimation, is 55 to 60%. The  left ventricle has normal function. The left ventricular internal cavity  size was moderately dilated.   3. Right ventricular systolic function is normal. The right ventricular  size is mildly enlarged.   4. Left atrial size was moderately dilated. No left atrial/left atrial  appendage thrombus was detected.   5. Right atrial size was mildly dilated.   6. The mitral valve is normal in structure. Mild mitral valve  regurgitation.   7. The aortic valve is tricuspid. Aortic valve regurgitation is severe.   8. Aortic dilatation noted. There is mild dilatation of the ascending  aorta, measuring 43 mm. There is mild (Grade II) plaque involving the  descending aorta.   FINDINGS   Left Ventricle: Left ventricular ejection fraction, by estimation, is 55  to 60%. The left ventricle has normal function. The left ventricular  internal cavity size was moderately dilated.   Right Ventricle: The right ventricular size is mildly enlarged. Right  vetricular wall thickness was not assessed. Right ventricular systolic  function is normal.   Left Atrium: Left atrial size was moderately dilated. No left atrial/left  atrial appendage thrombus was detected.   Right Atrium: Right atrial size was mildly dilated.   Pericardium: There is no evidence of pericardial effusion.  Mitral Valve: The mitral valve is normal in structure. Mild mitral valve  regurgitation.   Tricuspid Valve: The tricuspid valve is normal in structure. Tricuspid  valve regurgitation is mild.   Aortic Valve: The aortic valve is tricuspid. Aortic valve regurgitation is  severe. Aortic regurgitation PHT measures 485 msec.   Pulmonic Valve: The pulmonic valve was normal in structure. Pulmonic valve  regurgitation is mild.   Aorta: Aortic dilatation noted. There is mild dilatation of the ascending  aorta, measuring 43 mm. There is mild (Grade II) plaque involving the  descending  aorta.   IAS/Shunts: The interatrial septum is aneurysmal. No atrial level shunt  detected by color flow Doppler.   Additional Comments: Dilated ascending aorta; severe, cental aortic  insufficiency.      AORTIC VALVE  AI PHT:      485 msec     AORTA  Ao Root diam: 3.60 cm  Ao Asc diam:  4.20 cm   [name] MD  Electronically signed by [name] MD  Signature Date/Time: 07/10/2021/10:18:37 AM         Final       Physicians   Panel Physicians Referring Physician Case Authorizing Physician  Jettie Booze, MD (Primary)          Procedures   RIGHT/LEFT HEART CATH AND CORONARY ANGIOGRAPHY      Conclusion       Previously placed Mid LAD stent (unknown type) is  widely patent.   LV end diastolic pressure is mildly elevated.   Hemodynamic findings consistent with moderate pulmonary hypertension.   There is no aortic valve stenosis.   Mild, diffuse nonobstructive CAD   On 4L [place], Ao sat 91%, PA sat 73%, PA pressure 67/20, mean PA 39 mm Hg; mean PCWP 17 mm Hg; CO 10.1 L/min; CI 4.   Due to dilated aortic root, JL 5  for left and AL2 for RCA may be the best catheters.     Continue with plans for surgical evaluation for AVR.     Recommendations   Antiplatelet/Anticoag Recommend to resume Apixaban, at currently prescribed dose and frequency on 07/17/2021.          Procedural Details   Technical Details The risks, benefits, and details of the procedure were explained to the patient.  The patient verbalized understanding and wanted to proceed.  Informed written consent was obtained.  PROCEDURE TECHNIQUE:  After Xylocaine anesthesia, a 5 French sheath was placed in the right antecubital area in exchange for a peripheral IV. A 5 French balloontipped Swan-Ganz catheter was advanced to the pulmonary artery under fluoroscopic guidance. Hemodynamic pressures were obtained. Oxygen saturations were obtained. After Xylocaine anesthesia, a 34F sheath was placed in  the right radial artery with a single anterior needle wall stick.   Left heart cath was done using a JR4 catheter. Left coronary angiography was done using a Judkins L4 guide catheter. Right coronary angiography was done using a Judkins R4 guide catheter, but AL1 worked better.  TR band for hemostasis of radial artery.     Contrast: 75 cc Estimated blood loss <50 mL.   During this procedure medications were administered to achieve and maintain moderate conscious sedation while the patient's heart rate, blood pressure, and oxygen saturation were continuously monitored and I was present face-to-face 100% of this time.      Medications (Filter: Administrations occurring from [phone] to 1037 on 07/16/21)  important  Continuous medications are totaled by the amount administered until 07/16/21  1037.      Heparin (Porcine) in NaCl 1000-0.9 UT/500ML-% SOLN (mL) Total volume:  1,000 mL  Date/Time Rate/Dose/Volume Action    07/16/21 0954 500 mL Given    0954 500 mL Given      midazolam (VERSED) injection (mg) Total dose:  1 mg  Date/Time Rate/Dose/Volume Action    07/16/21 1004 1 mg Given      fentaNYL (SUBLIMAZE) injection (mcg) Total dose:  25 mcg  Date/Time Rate/Dose/Volume Action    07/16/21 1004 25 mcg Given      lidocaine (PF) (XYLOCAINE) 1 % injection (mL) Total volume:  4 mL  Date/Time Rate/Dose/Volume Action    07/16/21 1007 2 mL Given    1007 2 mL Given      Radial Cocktail/Verapamil only (mL) Total volume:  10 mL  Date/Time Rate/Dose/Volume Action    07/16/21 1009 10 mL Given      heparin sodium (porcine) injection (Units) Total dose:  6,500 Units  Date/Time Rate/Dose/Volume Action    07/16/21 1015 6,500 Units Given      iohexol (OMNIPAQUE) 350 MG/ML injection (mL) Total volume:  75 mL  Date/Time Rate/Dose/Volume Action    07/16/21 1029 75 mL Given        Sedation Time   Sedation Time Physician-1: 24 minutes 34 seconds     Contrast   Medication Name  Total Dose  iohexol (OMNIPAQUE) 350 MG/ML injection 75 mL      Radiation/Fluoro   Fluoro time: 5.2 (min) DAP: 33.7 (Gycm2) Cumulative Air Kerma: 457.4 (mGy)     Coronary Findings     Diagnostic Dominance: Right   Left Anterior Descending  The vessel exhibits minimal luminal irregularities.  Previously placed Mid LAD stent (unknown type) is widely patent.  Left Circumflex  The vessel exhibits minimal luminal irregularities.  Right Coronary Artery  The vessel exhibits minimal luminal irregularities.    Intervention     No interventions have been documented.       Right Heart   Right Heart Pressures Hemodynamic findings consistent with moderate pulmonary hypertension. On 4L [place], Ao sat 91%, PA sat 73%, PA pressure 67/20, mean PA 39 mm Hg; mean PCWP 17 mm Hg; CO 10.1 L/min; CI 4.                Left Heart   Left Ventricle LV end diastolic pressure is mildly elevated.  Aortic Valve There is no aortic valve stenosis.      Coronary Diagrams     Diagnostic Dominance: Right      Intervention         Implants      No implant documentation for this case.      Syngo Images    Show images for CARDIAC CATHETERIZATION   Images on Long Term Storage    Show images for Herschel, Fleagle. "JIM"   Link to Procedure Log   Procedure Log        Hemo Data   Flowsheet Row Most Recent Value  Fick Cardiac Output 10.11 L/min  Fick Cardiac Output Index 4 (L/min)/BSA  RA A Wave 15 mmHg  RA V Wave 15 mmHg  RA Mean 11 mmHg  RV Systolic Pressure 71 mmHg  RV Diastolic Pressure 7 mmHg  RV EDP 18 mmHg  PA Systolic Pressure 67 mmHg  PA Diastolic Pressure 20 mmHg  PA Mean 39 mmHg  PW A Wave 21 mmHg  PW V Wave 19 mmHg  PW Mean 17 mmHg  AO Systolic Pressure 196 mmHg  AO Diastolic Pressure 50 mmHg  AO Mean 85 mmHg  LV Systolic Pressure 222 mmHg  LV Diastolic Pressure 10 mmHg  LV EDP 22 mmHg  AOp Systolic Pressure 979 mmHg  AOp Diastolic Pressure 53 mmHg   AOp Mean Pressure 86 mmHg  LVp Systolic Pressure 892 mmHg  LVp Diastolic Pressure 9 mmHg  LVp EDP Pressure 18 mmHg  QP/QS 1  TPVR Index 9.77 HRUI  TSVR Index 21.29 HRUI  PVR SVR Ratio 0.3  TPVR/TSVR Ratio 0.46      ADDENDUM REPORT: 06/14/2021 09:58   ADDENDUM: Comment: There is enlargement of the main pulmonary outflow tract measuring 4.6 cm, a finding felt to be indicative of underlying pulmonary arterial hypertension.     Electronically Signed   By: Lowella Grip III M.D.   On: 06/14/2021 09:58    Addended by Erling Conte, MD on 06/14/2021 10:01 AM      Study Result   Narrative & Impression  CLINICAL DATA:  Thoracic aortic prominence   EXAM: CT ANGIOGRAPHY CHEST WITH CONTRAST   TECHNIQUE: Multidetector CT imaging of the chest was performed using the standard protocol during bolus administration of intravenous contrast. Multiplanar CT image reconstructions and MIPs were obtained to evaluate the vascular anatomy.   CONTRAST:  72m ISOVUE-370 IOPAMIDOL (ISOVUE-370) INJECTION 76%   COMPARISON:  June 15, 2020 and August 20, 2019 CT examinations.   FINDINGS: Cardiovascular: Ascending thoracic aortic diameter is stable, measuring 4.4 x 4.4 cm. Measured diameter at the sinuses of Valsalva measures 3.8 cm, stable. Measured diameter at the level of the aortic arch measures 3.5 cm, stable. Measured diameter of the descending aorta at the main pulmonary outflow tract level measures 3.2 x 3.2 cm. No evident thoracic aortic dissection. There are scattered foci of calcification in visualized great vessels. There is aortic atherosclerosis at several levels in the thoracic region. There are foci of coronary artery calcification within apparent stent in the left anterior descending coronary artery. There is no pericardial effusion or pericardial thickening. No demonstrable pulmonary embolus.   Mediastinum/Nodes: Thyroid appears unremarkable. There is a  stable mildly prominent lymph node in the aortopulmonary window region with short axis diameter of 1.1 cm. There are several subcentimeter lymph nodes elsewhere, stable. No esophageal lesions evident.   Lungs/Pleura: Nodular opacity abutting the pleura in the right middle lobe is smaller currently measuring 8 mm. No new nodular opacities are evident. There is underlying emphysematous change with areas of fibrosis throughout portions of the upper and lower lobes bilaterally. Scattered areas of scarring are stable. No edema or airspace opacity. No pleural effusions.   Upper Abdomen: There is cholelithiasis. There is a cyst arising from the posterior mid right kidney measuring 5.9 x 4.4 cm. Visualized upper abdominal structures otherwise appear unremarkable.   Musculoskeletal: There is degenerative change in the thoracic spine. No blastic or lytic bone lesions. No chest wall lesions.   Review of the MIP images confirms the above findings.   IMPRESSION: 1. Stable prominence of the ascending thoracic aorta measuring 4.4 x 4.4 cm. Other measurements as noted. No dissection. There is aortic atherosclerosis as well as foci of great vessel and coronary artery calcification. Recommend annual imaging followup by CTA or MRA. This recommendation follows 2010 ACCF/AHA/AATS/ACR/ASA/SCA/SCAI/SIR/STS/SVM Guidelines for the Diagnosis and Management of Patients with Thoracic Aortic Disease. Circulation. 2010; 121:: J194-R740 Aortic aneurysm NOS (ICD10-I71.9)   2.  No evident pulmonary embolus.   3. Underlying emphysematous change with areas  of fibrosis and scarring, essentially stable. Nodular opacity right middle lobe smaller compared to previous study. This area may represent localized rounded atelectasis. No new nodular appearing opacities. No consolidation   4. Single mildly prominent aorta pulmonary window lymph node, likely of reactive etiology. No other enlarged lymph nodes by  size criteria.   5.  Cholelithiasis.   Aortic Atherosclerosis (ICD10-I70.0).   Electronically Signed: By: Lowella Grip III M.D. On: 06/14/2021 08:54          Impression:   This 70 year old gentleman has stage D, severe, symptomatic aortic insufficiency with New York Heart Association class III symptoms of exertional fatigue and shortness of breath consistent with chronic diastolic congestive heart failure.  He also has history of paroxysmal atrial fibrillation with multiple cardioversions in the past and currently maintained on Tikosyn and Eliquis for anticoagulation.  He has a trileaflet aortic valve with known mild enlargement of the ascending aorta measured at 4.4 cm on recent CTA of the chest which has been unchanged dating back to 2019.  I have personally reviewed his CTA of the chest, echocardiogram, and cardiac catheterization.  His descending aorta at the same level of his mid ascending aorta measures 3.3 cm and he is 6 foot 1 and 300 pounds which makes his 4.4 cm ascending aorta less significant.  His cardiac catheterization does not show any significant coronary artery disease with a patent stent in the LAD.  I think the best treatment for this gentleman is aortic valve replacement using a bioprosthetic valve and biatrial Maze procedure.  I do not think his aorta warrants replacement at his age and would significantly increase the complexity and risk of surgery.  His oxygen saturation on room air today was 82%.  His arterial blood gas during catheterization showed a PO2 of 66 with a sat of 91%.  Chest CT does show underlying emphysematous change with areas of fibrosis and scarring that has been stable.  Previous pulmonary function testing in March 2019  showed no significant obstructive disease but did show a severe defect in diffusion capacity at 46% of predicted.  He has an appointment later today with pulmonary medicine.  I suspect his lung disease is multifactorial due to obesity,  COPD from previous smoking, pulmonary hypertension, and his aortic insufficiency.  I think he would probably qualify for home oxygen and will likely need oxygen after surgery. I discussed the operative procedure with the patient including alternatives, benefits and risks; including but not limited to bleeding, blood transfusion, infection, stroke, myocardial infarction, graft failure, heart block requiring a permanent pacemaker, organ dysfunction, and death.  [name]. understands and agrees to proceed.       Plan:   Aortic valve replacement and Maze procedure.       [name], MD [facility] [phone]

## 2021-09-05 NOTE — Progress Notes (Signed)
Patient is allowed to have spouse and son to wait in the lobby while having surgery, per Anette Riedel.

## 2021-09-06 ENCOUNTER — Other Ambulatory Visit: Payer: Self-pay | Admitting: Cardiology

## 2021-09-06 ENCOUNTER — Inpatient Hospital Stay (HOSPITAL_COMMUNITY): Payer: Medicare Other

## 2021-09-06 ENCOUNTER — Encounter (HOSPITAL_COMMUNITY): Payer: Self-pay | Admitting: Surgery

## 2021-09-06 ENCOUNTER — Other Ambulatory Visit: Payer: Self-pay

## 2021-09-06 ENCOUNTER — Inpatient Hospital Stay (HOSPITAL_COMMUNITY): Payer: Medicare Other | Admitting: Anesthesiology

## 2021-09-06 ENCOUNTER — Ambulatory Visit (HOSPITAL_COMMUNITY): Payer: Medicare Other | Attending: Surgery

## 2021-09-06 ENCOUNTER — Inpatient Hospital Stay (HOSPITAL_COMMUNITY)
Admission: RE | Admit: 2021-09-06 | Discharge: 2021-09-29 | DRG: 219 | Disposition: E | Payer: Medicare Other | Attending: Surgery | Admitting: Surgery

## 2021-09-06 ENCOUNTER — Encounter (HOSPITAL_COMMUNITY): Admission: RE | Disposition: E | Payer: Self-pay | Source: Home / Self Care | Attending: Surgery

## 2021-09-06 DIAGNOSIS — Z953 Presence of xenogenic heart valve: Secondary | ICD-10-CM

## 2021-09-06 DIAGNOSIS — Z884 Allergy status to anesthetic agent status: Secondary | ICD-10-CM

## 2021-09-06 DIAGNOSIS — Z9981 Dependence on supplemental oxygen: Secondary | ICD-10-CM

## 2021-09-06 DIAGNOSIS — J811 Chronic pulmonary edema: Secondary | ICD-10-CM

## 2021-09-06 DIAGNOSIS — I50811 Acute right heart failure: Secondary | ICD-10-CM | POA: Diagnosis not present

## 2021-09-06 DIAGNOSIS — R0989 Other specified symptoms and signs involving the circulatory and respiratory systems: Secondary | ICD-10-CM

## 2021-09-06 DIAGNOSIS — T8111XA Postprocedural  cardiogenic shock, initial encounter: Secondary | ICD-10-CM | POA: Diagnosis not present

## 2021-09-06 DIAGNOSIS — I252 Old myocardial infarction: Secondary | ICD-10-CM | POA: Diagnosis not present

## 2021-09-06 DIAGNOSIS — I359 Nonrheumatic aortic valve disorder, unspecified: Secondary | ICD-10-CM

## 2021-09-06 DIAGNOSIS — J189 Pneumonia, unspecified organism: Secondary | ICD-10-CM | POA: Diagnosis not present

## 2021-09-06 DIAGNOSIS — Z20822 Contact with and (suspected) exposure to covid-19: Secondary | ICD-10-CM | POA: Diagnosis present

## 2021-09-06 DIAGNOSIS — J9600 Acute respiratory failure, unspecified whether with hypoxia or hypercapnia: Secondary | ICD-10-CM

## 2021-09-06 DIAGNOSIS — E785 Hyperlipidemia, unspecified: Secondary | ICD-10-CM | POA: Diagnosis present

## 2021-09-06 DIAGNOSIS — Z952 Presence of prosthetic heart valve: Secondary | ICD-10-CM | POA: Diagnosis not present

## 2021-09-06 DIAGNOSIS — I350 Nonrheumatic aortic (valve) stenosis: Principal | ICD-10-CM | POA: Diagnosis present

## 2021-09-06 DIAGNOSIS — I5033 Acute on chronic diastolic (congestive) heart failure: Secondary | ICD-10-CM | POA: Diagnosis not present

## 2021-09-06 DIAGNOSIS — I2721 Secondary pulmonary arterial hypertension: Secondary | ICD-10-CM | POA: Diagnosis not present

## 2021-09-06 DIAGNOSIS — I272 Pulmonary hypertension, unspecified: Secondary | ICD-10-CM | POA: Diagnosis present

## 2021-09-06 DIAGNOSIS — I440 Atrioventricular block, first degree: Secondary | ICD-10-CM | POA: Diagnosis not present

## 2021-09-06 DIAGNOSIS — I351 Nonrheumatic aortic (valve) insufficiency: Secondary | ICD-10-CM | POA: Diagnosis present

## 2021-09-06 DIAGNOSIS — Z6841 Body Mass Index (BMI) 40.0 and over, adult: Secondary | ICD-10-CM | POA: Diagnosis not present

## 2021-09-06 DIAGNOSIS — D62 Acute posthemorrhagic anemia: Secondary | ICD-10-CM | POA: Diagnosis not present

## 2021-09-06 DIAGNOSIS — K219 Gastro-esophageal reflux disease without esophagitis: Secondary | ICD-10-CM | POA: Diagnosis present

## 2021-09-06 DIAGNOSIS — I130 Hypertensive heart and chronic kidney disease with heart failure and stage 1 through stage 4 chronic kidney disease, or unspecified chronic kidney disease: Secondary | ICD-10-CM | POA: Diagnosis present

## 2021-09-06 DIAGNOSIS — E1122 Type 2 diabetes mellitus with diabetic chronic kidney disease: Secondary | ICD-10-CM | POA: Diagnosis present

## 2021-09-06 DIAGNOSIS — J9601 Acute respiratory failure with hypoxia: Secondary | ICD-10-CM | POA: Diagnosis not present

## 2021-09-06 DIAGNOSIS — I480 Paroxysmal atrial fibrillation: Secondary | ICD-10-CM | POA: Diagnosis present

## 2021-09-06 DIAGNOSIS — Z9911 Dependence on respirator [ventilator] status: Secondary | ICD-10-CM | POA: Diagnosis not present

## 2021-09-06 DIAGNOSIS — Z9689 Presence of other specified functional implants: Secondary | ICD-10-CM

## 2021-09-06 DIAGNOSIS — Z888 Allergy status to other drugs, medicaments and biological substances status: Secondary | ICD-10-CM

## 2021-09-06 DIAGNOSIS — N990 Postprocedural (acute) (chronic) kidney failure: Secondary | ICD-10-CM | POA: Diagnosis not present

## 2021-09-06 DIAGNOSIS — Z8041 Family history of malignant neoplasm of ovary: Secondary | ICD-10-CM

## 2021-09-06 DIAGNOSIS — Z7984 Long term (current) use of oral hypoglycemic drugs: Secondary | ICD-10-CM

## 2021-09-06 DIAGNOSIS — J9621 Acute and chronic respiratory failure with hypoxia: Secondary | ICD-10-CM | POA: Diagnosis not present

## 2021-09-06 DIAGNOSIS — J939 Pneumothorax, unspecified: Secondary | ICD-10-CM

## 2021-09-06 DIAGNOSIS — E1165 Type 2 diabetes mellitus with hyperglycemia: Secondary | ICD-10-CM | POA: Diagnosis not present

## 2021-09-06 DIAGNOSIS — Z01818 Encounter for other preprocedural examination: Secondary | ICD-10-CM

## 2021-09-06 DIAGNOSIS — E662 Morbid (severe) obesity with alveolar hypoventilation: Secondary | ICD-10-CM | POA: Diagnosis present

## 2021-09-06 DIAGNOSIS — Y95 Nosocomial condition: Secondary | ICD-10-CM | POA: Diagnosis not present

## 2021-09-06 DIAGNOSIS — E870 Hyperosmolality and hypernatremia: Secondary | ICD-10-CM | POA: Diagnosis not present

## 2021-09-06 DIAGNOSIS — K567 Ileus, unspecified: Secondary | ICD-10-CM

## 2021-09-06 DIAGNOSIS — Z79899 Other long term (current) drug therapy: Secondary | ICD-10-CM

## 2021-09-06 DIAGNOSIS — R570 Cardiogenic shock: Secondary | ICD-10-CM | POA: Diagnosis not present

## 2021-09-06 DIAGNOSIS — T17890A Other foreign object in other parts of respiratory tract causing asphyxiation, initial encounter: Secondary | ICD-10-CM | POA: Diagnosis not present

## 2021-09-06 DIAGNOSIS — Z955 Presence of coronary angioplasty implant and graft: Secondary | ICD-10-CM

## 2021-09-06 DIAGNOSIS — Z803 Family history of malignant neoplasm of breast: Secondary | ICD-10-CM

## 2021-09-06 DIAGNOSIS — J9690 Respiratory failure, unspecified, unspecified whether with hypoxia or hypercapnia: Secondary | ICD-10-CM

## 2021-09-06 DIAGNOSIS — Z978 Presence of other specified devices: Secondary | ICD-10-CM

## 2021-09-06 DIAGNOSIS — J80 Acute respiratory distress syndrome: Secondary | ICD-10-CM

## 2021-09-06 DIAGNOSIS — D6959 Other secondary thrombocytopenia: Secondary | ICD-10-CM | POA: Diagnosis not present

## 2021-09-06 DIAGNOSIS — Z9889 Other specified postprocedural states: Secondary | ICD-10-CM

## 2021-09-06 DIAGNOSIS — I5031 Acute diastolic (congestive) heart failure: Secondary | ICD-10-CM | POA: Diagnosis not present

## 2021-09-06 DIAGNOSIS — Z4659 Encounter for fitting and adjustment of other gastrointestinal appliance and device: Secondary | ICD-10-CM

## 2021-09-06 DIAGNOSIS — R0602 Shortness of breath: Secondary | ICD-10-CM | POA: Diagnosis present

## 2021-09-06 DIAGNOSIS — Z801 Family history of malignant neoplasm of trachea, bronchus and lung: Secondary | ICD-10-CM

## 2021-09-06 DIAGNOSIS — Z7901 Long term (current) use of anticoagulants: Secondary | ICD-10-CM

## 2021-09-06 DIAGNOSIS — Z882 Allergy status to sulfonamides status: Secondary | ICD-10-CM

## 2021-09-06 DIAGNOSIS — Z8349 Family history of other endocrine, nutritional and metabolic diseases: Secondary | ICD-10-CM

## 2021-09-06 DIAGNOSIS — Z87891 Personal history of nicotine dependence: Secondary | ICD-10-CM

## 2021-09-06 DIAGNOSIS — N179 Acute kidney failure, unspecified: Secondary | ICD-10-CM | POA: Diagnosis not present

## 2021-09-06 DIAGNOSIS — Z452 Encounter for adjustment and management of vascular access device: Secondary | ICD-10-CM

## 2021-09-06 DIAGNOSIS — N1832 Chronic kidney disease, stage 3b: Secondary | ICD-10-CM | POA: Diagnosis present

## 2021-09-06 DIAGNOSIS — F419 Anxiety disorder, unspecified: Secondary | ICD-10-CM | POA: Diagnosis present

## 2021-09-06 DIAGNOSIS — J810 Acute pulmonary edema: Secondary | ICD-10-CM | POA: Diagnosis not present

## 2021-09-06 DIAGNOSIS — J432 Centrilobular emphysema: Secondary | ICD-10-CM | POA: Diagnosis present

## 2021-09-06 DIAGNOSIS — J8410 Pulmonary fibrosis, unspecified: Secondary | ICD-10-CM | POA: Diagnosis present

## 2021-09-06 DIAGNOSIS — I50810 Right heart failure, unspecified: Secondary | ICD-10-CM | POA: Diagnosis not present

## 2021-09-06 DIAGNOSIS — T884XXA Failed or difficult intubation, initial encounter: Secondary | ICD-10-CM

## 2021-09-06 DIAGNOSIS — J9811 Atelectasis: Secondary | ICD-10-CM | POA: Diagnosis not present

## 2021-09-06 DIAGNOSIS — I2510 Atherosclerotic heart disease of native coronary artery without angina pectoris: Secondary | ICD-10-CM | POA: Diagnosis present

## 2021-09-06 HISTORY — DX: Cardiac arrhythmia, unspecified: I49.9

## 2021-09-06 HISTORY — PX: AORTIC VALVE REPLACEMENT: SHX41

## 2021-09-06 HISTORY — PX: MAZE: SHX5063

## 2021-09-06 HISTORY — PX: TEE WITHOUT CARDIOVERSION: SHX5443

## 2021-09-06 HISTORY — DX: Chronic obstructive pulmonary disease, unspecified: J44.9

## 2021-09-06 HISTORY — PX: CLIPPING OF ATRIAL APPENDAGE: SHX5773

## 2021-09-06 LAB — POCT I-STAT, CHEM 8
BUN: 31 mg/dL — ABNORMAL HIGH (ref 8–23)
BUN: 31 mg/dL — ABNORMAL HIGH (ref 8–23)
BUN: 30 mg/dL — ABNORMAL HIGH (ref 8–23)
BUN: 30 mg/dL — ABNORMAL HIGH (ref 8–23)
BUN: 36 mg/dL — ABNORMAL HIGH (ref 8–23)
BUN: 33 mg/dL — ABNORMAL HIGH (ref 8–23)
BUN: 32 mg/dL — ABNORMAL HIGH (ref 8–23)
Calcium, Ion: 1.3 mmol/L (ref 1.15–1.40)
Calcium, Ion: 1.27 mmol/L (ref 1.15–1.40)
Calcium, Ion: 1.19 mmol/L (ref 1.15–1.40)
Calcium, Ion: 1.19 mmol/L (ref 1.15–1.40)
Calcium, Ion: 1.29 mmol/L (ref 1.15–1.40)
Calcium, Ion: 1.27 mmol/L (ref 1.15–1.40)
Calcium, Ion: 1.43 mmol/L — ABNORMAL HIGH (ref 1.15–1.40)
Chloride: 107 mmol/L (ref 98–111)
Chloride: 108 mmol/L (ref 98–111)
Chloride: 108 mmol/L (ref 98–111)
Chloride: 108 mmol/L (ref 98–111)
Chloride: 106 mmol/L (ref 98–111)
Chloride: 108 mmol/L (ref 98–111)
Chloride: 108 mmol/L (ref 98–111)
Creatinine, Ser: 1.5 mg/dL — ABNORMAL HIGH (ref 0.61–1.24)
Creatinine, Ser: 1.5 mg/dL — ABNORMAL HIGH (ref 0.61–1.24)
Creatinine, Ser: 1.5 mg/dL — ABNORMAL HIGH (ref 0.61–1.24)
Creatinine, Ser: 1.4 mg/dL — ABNORMAL HIGH (ref 0.61–1.24)
Creatinine, Ser: 1.6 mg/dL — ABNORMAL HIGH (ref 0.61–1.24)
Creatinine, Ser: 1.5 mg/dL — ABNORMAL HIGH (ref 0.61–1.24)
Creatinine, Ser: 1.7 mg/dL — ABNORMAL HIGH (ref 0.61–1.24)
Glucose, Bld: 146 mg/dL — ABNORMAL HIGH (ref 70–99)
Glucose, Bld: 147 mg/dL — ABNORMAL HIGH (ref 70–99)
Glucose, Bld: 159 mg/dL — ABNORMAL HIGH (ref 70–99)
Glucose, Bld: 152 mg/dL — ABNORMAL HIGH (ref 70–99)
Glucose, Bld: 167 mg/dL — ABNORMAL HIGH (ref 70–99)
Glucose, Bld: 160 mg/dL — ABNORMAL HIGH (ref 70–99)
Glucose, Bld: 147 mg/dL — ABNORMAL HIGH (ref 70–99)
HCT: 37 % — ABNORMAL LOW (ref 39.0–52.0)
HCT: 36 % — ABNORMAL LOW (ref 39.0–52.0)
HCT: 30 % — ABNORMAL LOW (ref 39.0–52.0)
HCT: 32 % — ABNORMAL LOW (ref 39.0–52.0)
HCT: 33 % — ABNORMAL LOW (ref 39.0–52.0)
HCT: 32 % — ABNORMAL LOW (ref 39.0–52.0)
HCT: 36 % — ABNORMAL LOW (ref 39.0–52.0)
Hemoglobin: 12.6 g/dL — ABNORMAL LOW (ref 13.0–17.0)
Hemoglobin: 12.2 g/dL — ABNORMAL LOW (ref 13.0–17.0)
Hemoglobin: 10.2 g/dL — ABNORMAL LOW (ref 13.0–17.0)
Hemoglobin: 10.9 g/dL — ABNORMAL LOW (ref 13.0–17.0)
Hemoglobin: 11.2 g/dL — ABNORMAL LOW (ref 13.0–17.0)
Hemoglobin: 10.9 g/dL — ABNORMAL LOW (ref 13.0–17.0)
Hemoglobin: 12.2 g/dL — ABNORMAL LOW (ref 13.0–17.0)
Potassium: 4.4 mmol/L (ref 3.5–5.1)
Potassium: 4.6 mmol/L (ref 3.5–5.1)
Potassium: 5.1 mmol/L (ref 3.5–5.1)
Potassium: 4.7 mmol/L (ref 3.5–5.1)
Potassium: 5.2 mmol/L — ABNORMAL HIGH (ref 3.5–5.1)
Potassium: 4.7 mmol/L (ref 3.5–5.1)
Potassium: 4.3 mmol/L (ref 3.5–5.1)
Sodium: 141 mmol/L (ref 135–145)
Sodium: 139 mmol/L (ref 135–145)
Sodium: 139 mmol/L (ref 135–145)
Sodium: 140 mmol/L (ref 135–145)
Sodium: 141 mmol/L (ref 135–145)
Sodium: 142 mmol/L (ref 135–145)
Sodium: 145 mmol/L (ref 135–145)
TCO2: 26 mmol/L (ref 22–32)
TCO2: 25 mmol/L (ref 22–32)
TCO2: 26 mmol/L (ref 22–32)
TCO2: 23 mmol/L (ref 22–32)
TCO2: 27 mmol/L (ref 22–32)
TCO2: 25 mmol/L (ref 22–32)
TCO2: 28 mmol/L (ref 22–32)

## 2021-09-06 LAB — CBC
HCT: 42.1 % (ref 39.0–52.0)
HCT: 42.2 % (ref 39.0–52.0)
Hemoglobin: 13.4 g/dL (ref 13.0–17.0)
Hemoglobin: 13.3 g/dL (ref 13.0–17.0)
MCH: 30 pg (ref 26.0–34.0)
MCH: 29.6 pg (ref 26.0–34.0)
MCHC: 31.8 g/dL (ref 30.0–36.0)
MCHC: 31.5 g/dL (ref 30.0–36.0)
MCV: 94.4 fL (ref 80.0–100.0)
MCV: 93.8 fL (ref 80.0–100.0)
Platelets: 140 10*3/uL — ABNORMAL LOW (ref 150–400)
Platelets: 135 10*3/uL — ABNORMAL LOW (ref 150–400)
RBC: 4.46 MIL/uL (ref 4.22–5.81)
RBC: 4.5 MIL/uL (ref 4.22–5.81)
RDW: 15 % (ref 11.5–15.5)
RDW: 15.2 % (ref 11.5–15.5)
WBC: 20.8 10*3/uL — ABNORMAL HIGH (ref 4.0–10.5)
WBC: 21.1 10*3/uL — ABNORMAL HIGH (ref 4.0–10.5)
nRBC: 0 % (ref 0.0–0.2)
nRBC: 0 % (ref 0.0–0.2)

## 2021-09-06 LAB — POCT I-STAT 7, (LYTES, BLD GAS, ICA,H+H)
Acid-Base Excess: 0 mmol/L (ref 0.0–2.0)
Acid-Base Excess: 0 mmol/L (ref 0.0–2.0)
Acid-Base Excess: 0 mmol/L (ref 0.0–2.0)
Acid-base deficit: 3 mmol/L — ABNORMAL HIGH (ref 0.0–2.0)
Acid-base deficit: 2 mmol/L (ref 0.0–2.0)
Acid-base deficit: 3 mmol/L — ABNORMAL HIGH (ref 0.0–2.0)
Acid-base deficit: 6 mmol/L — ABNORMAL HIGH (ref 0.0–2.0)
Acid-base deficit: 1 mmol/L (ref 0.0–2.0)
Acid-base deficit: 5 mmol/L — ABNORMAL HIGH (ref 0.0–2.0)
Acid-base deficit: 3 mmol/L — ABNORMAL HIGH (ref 0.0–2.0)
Acid-base deficit: 4 mmol/L — ABNORMAL HIGH (ref 0.0–2.0)
Bicarbonate: 24.6 mmol/L (ref 20.0–28.0)
Bicarbonate: 26 mmol/L (ref 20.0–28.0)
Bicarbonate: 25.2 mmol/L (ref 20.0–28.0)
Bicarbonate: 24.8 mmol/L (ref 20.0–28.0)
Bicarbonate: 23.7 mmol/L (ref 20.0–28.0)
Bicarbonate: 24.4 mmol/L (ref 20.0–28.0)
Bicarbonate: 25.2 mmol/L (ref 20.0–28.0)
Bicarbonate: 27.7 mmol/L (ref 20.0–28.0)
Bicarbonate: 24.2 mmol/L (ref 20.0–28.0)
Bicarbonate: 24.5 mmol/L (ref 20.0–28.0)
Bicarbonate: 22.6 mmol/L (ref 20.0–28.0)
Calcium, Ion: 1.3 mmol/L (ref 1.15–1.40)
Calcium, Ion: 1.17 mmol/L (ref 1.15–1.40)
Calcium, Ion: 1.18 mmol/L (ref 1.15–1.40)
Calcium, Ion: 1.16 mmol/L (ref 1.15–1.40)
Calcium, Ion: 1.17 mmol/L (ref 1.15–1.40)
Calcium, Ion: 1.3 mmol/L (ref 1.15–1.40)
Calcium, Ion: 1.29 mmol/L (ref 1.15–1.40)
Calcium, Ion: 1.41 mmol/L — ABNORMAL HIGH (ref 1.15–1.40)
Calcium, Ion: 1.31 mmol/L (ref 1.15–1.40)
Calcium, Ion: 1.21 mmol/L (ref 1.15–1.40)
Calcium, Ion: 1.18 mmol/L (ref 1.15–1.40)
HCT: 39 % (ref 39.0–52.0)
HCT: 31 % — ABNORMAL LOW (ref 39.0–52.0)
HCT: 32 % — ABNORMAL LOW (ref 39.0–52.0)
HCT: 30 % — ABNORMAL LOW (ref 39.0–52.0)
HCT: 31 % — ABNORMAL LOW (ref 39.0–52.0)
HCT: 33 % — ABNORMAL LOW (ref 39.0–52.0)
HCT: 51 % (ref 39.0–52.0)
HCT: 37 % — ABNORMAL LOW (ref 39.0–52.0)
HCT: 41 % (ref 39.0–52.0)
HCT: 41 % (ref 39.0–52.0)
HCT: 40 % (ref 39.0–52.0)
Hemoglobin: 13.3 g/dL (ref 13.0–17.0)
Hemoglobin: 10.5 g/dL — ABNORMAL LOW (ref 13.0–17.0)
Hemoglobin: 10.9 g/dL — ABNORMAL LOW (ref 13.0–17.0)
Hemoglobin: 10.2 g/dL — ABNORMAL LOW (ref 13.0–17.0)
Hemoglobin: 10.5 g/dL — ABNORMAL LOW (ref 13.0–17.0)
Hemoglobin: 11.2 g/dL — ABNORMAL LOW (ref 13.0–17.0)
Hemoglobin: 17.3 g/dL — ABNORMAL HIGH (ref 13.0–17.0)
Hemoglobin: 12.6 g/dL — ABNORMAL LOW (ref 13.0–17.0)
Hemoglobin: 13.9 g/dL (ref 13.0–17.0)
Hemoglobin: 13.9 g/dL (ref 13.0–17.0)
Hemoglobin: 13.6 g/dL (ref 13.0–17.0)
O2 Saturation: 94 %
O2 Saturation: 100 %
O2 Saturation: 100 %
O2 Saturation: 100 %
O2 Saturation: 100 %
O2 Saturation: 99 %
O2 Saturation: 99 %
O2 Saturation: 90 %
O2 Saturation: 92 %
O2 Saturation: 96 %
O2 Saturation: 95 %
Patient temperature: 36.1
Patient temperature: 35.9
Patient temperature: 36.2
Potassium: 4.5 mmol/L (ref 3.5–5.1)
Potassium: 4.8 mmol/L (ref 3.5–5.1)
Potassium: 4.8 mmol/L (ref 3.5–5.1)
Potassium: 4.7 mmol/L (ref 3.5–5.1)
Potassium: 5.3 mmol/L — ABNORMAL HIGH (ref 3.5–5.1)
Potassium: 5.3 mmol/L — ABNORMAL HIGH (ref 3.5–5.1)
Potassium: 5.3 mmol/L — ABNORMAL HIGH (ref 3.5–5.1)
Potassium: 4.3 mmol/L (ref 3.5–5.1)
Potassium: 4.1 mmol/L (ref 3.5–5.1)
Potassium: 4 mmol/L (ref 3.5–5.1)
Potassium: 3.7 mmol/L (ref 3.5–5.1)
Sodium: 142 mmol/L (ref 135–145)
Sodium: 140 mmol/L (ref 135–145)
Sodium: 140 mmol/L (ref 135–145)
Sodium: 140 mmol/L (ref 135–145)
Sodium: 138 mmol/L (ref 135–145)
Sodium: 141 mmol/L (ref 135–145)
Sodium: 142 mmol/L (ref 135–145)
Sodium: 145 mmol/L (ref 135–145)
Sodium: 143 mmol/L (ref 135–145)
Sodium: 144 mmol/L (ref 135–145)
Sodium: 144 mmol/L (ref 135–145)
TCO2: 26 mmol/L (ref 22–32)
TCO2: 28 mmol/L (ref 22–32)
TCO2: 26 mmol/L (ref 22–32)
TCO2: 26 mmol/L (ref 22–32)
TCO2: 25 mmol/L (ref 22–32)
TCO2: 26 mmol/L (ref 22–32)
TCO2: 27 mmol/L (ref 22–32)
TCO2: 30 mmol/L (ref 22–32)
TCO2: 26 mmol/L (ref 22–32)
TCO2: 26 mmol/L (ref 22–32)
TCO2: 24 mmol/L (ref 22–32)
pCO2 arterial: 53.8 mmHg — ABNORMAL HIGH (ref 32.0–48.0)
pCO2 arterial: 49.6 mmHg — ABNORMAL HIGH (ref 32.0–48.0)
pCO2 arterial: 41 mmHg (ref 32.0–48.0)
pCO2 arterial: 40.4 mmHg (ref 32.0–48.0)
pCO2 arterial: 41.2 mmHg (ref 32.0–48.0)
pCO2 arterial: 52.8 mmHg — ABNORMAL HIGH (ref 32.0–48.0)
pCO2 arterial: 73.9 mmHg (ref 32.0–48.0)
pCO2 arterial: 63.4 mmHg — ABNORMAL HIGH (ref 32.0–48.0)
pCO2 arterial: 58.1 mmHg — ABNORMAL HIGH (ref 32.0–48.0)
pCO2 arterial: 48.8 mmHg — ABNORMAL HIGH (ref 32.0–48.0)
pCO2 arterial: 45.5 mmHg (ref 32.0–48.0)
pH, Arterial: 7.269 — ABNORMAL LOW (ref 7.350–7.450)
pH, Arterial: 7.328 — ABNORMAL LOW (ref 7.350–7.450)
pH, Arterial: 7.396 (ref 7.350–7.450)
pH, Arterial: 7.396 (ref 7.350–7.450)
pH, Arterial: 7.272 — ABNORMAL LOW (ref 7.350–7.450)
pH, Arterial: 7.367 (ref 7.350–7.450)
pH, Arterial: 7.142 — CL (ref 7.350–7.450)
pH, Arterial: 7.249 — ABNORMAL LOW (ref 7.350–7.450)
pH, Arterial: 7.222 — ABNORMAL LOW (ref 7.350–7.450)
pH, Arterial: 7.303 — ABNORMAL LOW (ref 7.350–7.450)
pH, Arterial: 7.3 — ABNORMAL LOW (ref 7.350–7.450)
pO2, Arterial: 84 mmHg (ref 83.0–108.0)
pO2, Arterial: 376 mmHg — ABNORMAL HIGH (ref 83.0–108.0)
pO2, Arterial: 264 mmHg — ABNORMAL HIGH (ref 83.0–108.0)
pO2, Arterial: 256 mmHg — ABNORMAL HIGH (ref 83.0–108.0)
pO2, Arterial: 138 mmHg — ABNORMAL HIGH (ref 83.0–108.0)
pO2, Arterial: 259 mmHg — ABNORMAL HIGH (ref 83.0–108.0)
pO2, Arterial: 161 mmHg — ABNORMAL HIGH (ref 83.0–108.0)
pO2, Arterial: 71 mmHg — ABNORMAL LOW (ref 83.0–108.0)
pO2, Arterial: 73 mmHg — ABNORMAL LOW (ref 83.0–108.0)
pO2, Arterial: 89 mmHg (ref 83.0–108.0)
pO2, Arterial: 81 mmHg — ABNORMAL LOW (ref 83.0–108.0)

## 2021-09-06 LAB — GLUCOSE, CAPILLARY
Glucose-Capillary: 114 mg/dL — ABNORMAL HIGH (ref 70–99)
Glucose-Capillary: 138 mg/dL — ABNORMAL HIGH (ref 70–99)
Glucose-Capillary: 142 mg/dL — ABNORMAL HIGH (ref 70–99)
Glucose-Capillary: 152 mg/dL — ABNORMAL HIGH (ref 70–99)
Glucose-Capillary: 167 mg/dL — ABNORMAL HIGH (ref 70–99)
Glucose-Capillary: 177 mg/dL — ABNORMAL HIGH (ref 70–99)
Glucose-Capillary: 188 mg/dL — ABNORMAL HIGH (ref 70–99)
Glucose-Capillary: 189 mg/dL — ABNORMAL HIGH (ref 70–99)
Glucose-Capillary: 189 mg/dL — ABNORMAL HIGH (ref 70–99)

## 2021-09-06 LAB — POCT I-STAT EG7
Acid-base deficit: 2 mmol/L (ref 0.0–2.0)
Bicarbonate: 25.1 mmol/L (ref 20.0–28.0)
Calcium, Ion: 1.19 mmol/L (ref 1.15–1.40)
HCT: 32 % — ABNORMAL LOW (ref 39.0–52.0)
Hemoglobin: 10.9 g/dL — ABNORMAL LOW (ref 13.0–17.0)
O2 Saturation: 82 %
Potassium: 4.7 mmol/L (ref 3.5–5.1)
Sodium: 141 mmol/L (ref 135–145)
TCO2: 27 mmol/L (ref 22–32)
pCO2, Ven: 51.4 mmHg (ref 44.0–60.0)
pH, Ven: 7.297 (ref 7.250–7.430)
pO2, Ven: 52 mmHg — ABNORMAL HIGH (ref 32.0–45.0)

## 2021-09-06 LAB — BASIC METABOLIC PANEL
Anion gap: 9 (ref 5–15)
BUN: 31 mg/dL — ABNORMAL HIGH (ref 8–23)
CO2: 23 mmol/L (ref 22–32)
Calcium: 8.4 mg/dL — ABNORMAL LOW (ref 8.9–10.3)
Chloride: 108 mmol/L (ref 98–111)
Creatinine, Ser: 1.87 mg/dL — ABNORMAL HIGH (ref 0.61–1.24)
GFR, Estimated: 38 mL/min — ABNORMAL LOW (ref >60)
Glucose, Bld: 208 mg/dL — ABNORMAL HIGH (ref 70–99)
Potassium: 3.7 mmol/L (ref 3.5–5.1)
Sodium: 140 mmol/L (ref 135–145)

## 2021-09-06 LAB — PROTIME-INR
INR: 1.6 — ABNORMAL HIGH (ref 0.8–1.2)
Prothrombin Time: 18.6 seconds — ABNORMAL HIGH (ref 11.4–15.2)

## 2021-09-06 LAB — HEMOGLOBIN AND HEMATOCRIT, BLOOD
HCT: 31.7 % — ABNORMAL LOW (ref 39.0–52.0)
Hemoglobin: 10.4 g/dL — ABNORMAL LOW (ref 13.0–17.0)

## 2021-09-06 LAB — MAGNESIUM: Magnesium: 3 mg/dL — ABNORMAL HIGH (ref 1.7–2.4)

## 2021-09-06 LAB — PREPARE RBC (CROSSMATCH)

## 2021-09-06 LAB — PLATELET COUNT: Platelets: 150 10*3/uL (ref 150–400)

## 2021-09-06 LAB — APTT: aPTT: 48 seconds — ABNORMAL HIGH (ref 24–36)

## 2021-09-06 SURGERY — REPLACEMENT, AORTIC VALVE, OPEN
Anesthesia: General | Site: Chest

## 2021-09-06 MED ORDER — PROPOFOL 10 MG/ML IV BOLUS
INTRAVENOUS | Status: DC | PRN
  Administered 2021-09-06: 100 mg via INTRAVENOUS

## 2021-09-06 MED ORDER — DOCUSATE SODIUM 100 MG PO CAPS: 200.0000 mg | ORAL_CAPSULE | Freq: Every day | ORAL | Status: DC

## 2021-09-06 MED ORDER — PAROXETINE HCL 20 MG PO TABS
60.0000 mg | ORAL_TABLET | Freq: Every day | ORAL | Status: DC
  Administered 2021-09-07 – 2021-09-22 (×16): 60 mg
  Filled 2021-09-06 (×5): qty 3
  Filled 2021-09-06: qty 2
  Filled 2021-09-06 (×8): qty 3
  Filled 2021-09-06: qty 2
  Filled 2021-09-06 (×3): qty 3

## 2021-09-06 MED ORDER — BUSPIRONE HCL 15 MG PO TABS
15.0000 mg | ORAL_TABLET | Freq: Two times a day (BID) | ORAL | Status: DC
  Filled 2021-09-06: qty 1

## 2021-09-06 MED ORDER — SODIUM CHLORIDE 0.9% FLUSH
3.0000 mL | Freq: Two times a day (BID) | INTRAVENOUS | Status: DC
  Administered 2021-09-07 – 2021-09-22 (×26): 3 mL via INTRAVENOUS

## 2021-09-06 MED ORDER — FENTANYL CITRATE (PF) 250 MCG/5ML IJ SOLN
INTRAMUSCULAR | Status: AC
  Filled 2021-09-06: qty 25

## 2021-09-06 MED ORDER — BRIMONIDINE TARTRATE 0.15 % OP SOLN
1.0000 [drp] | Freq: Three times a day (TID) | OPHTHALMIC | Status: DC
  Administered 2021-09-06 – 2021-09-22 (×44): 1 [drp] via OPHTHALMIC
  Filled 2021-09-06 (×2): qty 5

## 2021-09-06 MED ORDER — METOPROLOL TARTRATE 12.5 MG HALF TABLET
12.5000 mg | ORAL_TABLET | Freq: Once | ORAL | Status: AC
Start: 2021-09-06 — End: 2021-09-06
  Administered 2021-09-06: 12.5 mg via ORAL
  Filled 2021-09-06: qty 1

## 2021-09-06 MED ORDER — ALPRAZOLAM 0.5 MG PO TABS: 0.5000 mg | ORAL_TABLET | Freq: Two times a day (BID) | ORAL | Status: DC | PRN

## 2021-09-06 MED ORDER — TRAZODONE HCL 50 MG PO TABS: 50.0000 mg | ORAL_TABLET | Freq: Every day | ORAL | Status: DC

## 2021-09-06 MED ORDER — MIDAZOLAM HCL (PF) 10 MG/2ML IJ SOLN
INTRAMUSCULAR | Status: AC
  Filled 2021-09-06: qty 2

## 2021-09-06 MED ORDER — HEPARIN SODIUM (PORCINE) 1000 UNIT/ML IJ SOLN
INTRAMUSCULAR | Status: AC
  Filled 2021-09-06: qty 1

## 2021-09-06 MED ORDER — MIDAZOLAM HCL 2 MG/2ML IJ SOLN
2.0000 mg | INTRAMUSCULAR | Status: DC | PRN
  Administered 2021-09-08 – 2021-09-17 (×7): 2 mg via INTRAVENOUS
  Filled 2021-09-06 (×9): qty 2

## 2021-09-06 MED ORDER — PROTAMINE SULFATE 10 MG/ML IV SOLN
INTRAVENOUS | Status: DC | PRN
  Administered 2021-09-06: 30 mg via INTRAVENOUS
  Administered 2021-09-06: 270 mg via INTRAVENOUS

## 2021-09-06 MED ORDER — THROMBIN (RECOMBINANT) 20000 UNITS EX SOLR
CUTANEOUS | Status: AC
  Filled 2021-09-06: qty 20000

## 2021-09-06 MED ORDER — HEPARIN SODIUM (PORCINE) 1000 UNIT/ML IJ SOLN
INTRAMUSCULAR | Status: AC
  Filled 2021-09-06: qty 2

## 2021-09-06 MED ORDER — ACETAMINOPHEN 650 MG RE SUPP: 650.0000 mg | Freq: Once | RECTAL | Status: AC

## 2021-09-06 MED ORDER — ACETAMINOPHEN 160 MG/5ML PO SOLN
1000.0000 mg | Freq: Four times a day (QID) | ORAL | Status: AC
Start: 2021-09-07 — End: 2021-09-11
  Administered 2021-09-06 – 2021-09-11 (×18): 1000 mg
  Filled 2021-09-06 (×19): qty 40.6

## 2021-09-06 MED ORDER — FLUTICASONE PROPIONATE 50 MCG/ACT NA SUSP
2.0000 | Freq: Every day | NASAL | Status: DC
  Administered 2021-09-08 – 2021-09-22 (×15): 2 via NASAL
  Filled 2021-09-06 (×2): qty 16

## 2021-09-06 MED ORDER — LACTATED RINGERS IV SOLN
INTRAVENOUS | Status: DC
  Administered 2021-09-10: 20 mL via INTRAVENOUS

## 2021-09-06 MED ORDER — ASPIRIN EC 325 MG PO TBEC
325.0000 mg | DELAYED_RELEASE_TABLET | Freq: Every day | ORAL | Status: DC
Start: 2021-09-07 — End: 2021-09-16
  Filled 2021-09-06 (×2): qty 1

## 2021-09-06 MED ORDER — FENTANYL 2500MCG IN NS 250ML (10MCG/ML) PREMIX INFUSION: 25.0000 ug/h | INTRAVENOUS | Status: DC

## 2021-09-06 MED ORDER — EPHEDRINE 5 MG/ML INJ
INTRAVENOUS | Status: AC
  Filled 2021-09-06: qty 5

## 2021-09-06 MED ORDER — VASOPRESSIN 20 UNITS/100 ML INFUSION FOR SHOCK
0.0000 [IU]/min | INTRAVENOUS | Status: AC
  Administered 2021-09-06: .03 [IU]/min via INTRAVENOUS
  Filled 2021-09-06: qty 100

## 2021-09-06 MED ORDER — ACETAMINOPHEN 500 MG PO TABS: 1000.0000 mg | ORAL_TABLET | Freq: Four times a day (QID) | ORAL | Status: AC

## 2021-09-06 MED ORDER — CALCIUM CHLORIDE 10 % IV SOLN
INTRAVENOUS | Status: DC | PRN
  Administered 2021-09-06 (×5): 200 mg via INTRAVENOUS

## 2021-09-06 MED ORDER — TRAMADOL HCL 50 MG PO TABS: 50.0000 mg | ORAL_TABLET | ORAL | Status: DC | PRN

## 2021-09-06 MED ORDER — PAROXETINE HCL 30 MG PO TABS
60.0000 mg | ORAL_TABLET | Freq: Every day | ORAL | Status: DC
  Filled 2021-09-06: qty 2

## 2021-09-06 MED ORDER — CHLORHEXIDINE GLUCONATE 4 % EX LIQD: 30.0000 mL | CUTANEOUS | Status: DC

## 2021-09-06 MED ORDER — LEVALBUTEROL HCL 0.63 MG/3ML IN NEBU
0.6300 mg | INHALATION_SOLUTION | Freq: Three times a day (TID) | RESPIRATORY_TRACT | Status: DC | PRN
  Administered 2021-09-08 – 2021-09-22 (×7): 0.63 mg via RESPIRATORY_TRACT
  Filled 2021-09-06 (×7): qty 3

## 2021-09-06 MED ORDER — CHLORHEXIDINE GLUCONATE 0.12 % MT SOLN
15.0000 mL | OROMUCOSAL | Status: AC
  Administered 2021-09-06: 15 mL via OROMUCOSAL

## 2021-09-06 MED ORDER — EPHEDRINE SULFATE-NACL 50-0.9 MG/10ML-% IV SOSY
PREFILLED_SYRINGE | INTRAVENOUS | Status: DC | PRN
  Administered 2021-09-06: 5 mg via INTRAVENOUS

## 2021-09-06 MED ORDER — SODIUM CHLORIDE 0.9 % IV SOLN: INTRAVENOUS | Status: DC

## 2021-09-06 MED ORDER — DOCUSATE SODIUM 50 MG/5ML PO LIQD
200.0000 mg | Freq: Every day | ORAL | Status: DC
  Administered 2021-09-07 – 2021-09-22 (×15): 200 mg
  Filled 2021-09-06 (×15): qty 20

## 2021-09-06 MED ORDER — CHLORHEXIDINE GLUCONATE CLOTH 2 % EX PADS
6.0000 | MEDICATED_PAD | Freq: Every day | CUTANEOUS | Status: DC
  Administered 2021-09-08 – 2021-09-22 (×14): 6 via TOPICAL

## 2021-09-06 MED ORDER — SODIUM BICARBONATE 8.4 % IV SOLN
100.0000 meq | Freq: Once | INTRAVENOUS | Status: AC
  Administered 2021-09-06: 100 meq via INTRAVENOUS

## 2021-09-06 MED ORDER — NITROGLYCERIN IN D5W 200-5 MCG/ML-% IV SOLN: 0.0000 ug/min | INTRAVENOUS | Status: DC

## 2021-09-06 MED ORDER — BUSPIRONE HCL 10 MG PO TABS
15.0000 mg | ORAL_TABLET | Freq: Two times a day (BID) | ORAL | Status: DC
  Administered 2021-09-06 – 2021-09-07 (×2): 15 mg
  Filled 2021-09-06: qty 2
  Filled 2021-09-06: qty 1
  Filled 2021-09-06: qty 2
  Filled 2021-09-06: qty 1

## 2021-09-06 MED ORDER — PANTOPRAZOLE SODIUM 40 MG PO PACK
40.0000 mg | PACK | Freq: Every day | ORAL | Status: DC
  Administered 2021-09-07 – 2021-09-13 (×7): 40 mg
  Filled 2021-09-06 (×7): qty 20

## 2021-09-06 MED ORDER — EPINEPHRINE HCL 5 MG/250ML IV SOLN IN NS
0.5000 ug/min | INTRAVENOUS | Status: DC
  Administered 2021-09-06 – 2021-09-07 (×3): 10 ug/min via INTRAVENOUS
  Administered 2021-09-08 – 2021-09-09 (×3): 8 ug/min via INTRAVENOUS
  Administered 2021-09-09 – 2021-09-10 (×2): 7 ug/min via INTRAVENOUS
  Administered 2021-09-10 – 2021-09-11 (×3): 6 ug/min via INTRAVENOUS
  Filled 2021-09-06 (×11): qty 250

## 2021-09-06 MED ORDER — THROMBIN 20000 UNITS EX KIT
PACK | CUTANEOUS | Status: DC | PRN
  Administered 2021-09-06: 20000 [IU] via TOPICAL

## 2021-09-06 MED ORDER — VASOPRESSIN 20 UNIT/ML IV SOLN
INTRAVENOUS | Status: AC
  Filled 2021-09-06: qty 1

## 2021-09-06 MED ORDER — SODIUM CHLORIDE 0.9 % IV SOLN: 250.0000 mL | INTRAVENOUS | Status: DC

## 2021-09-06 MED ORDER — ALBUMIN HUMAN 5 % IV SOLN: INTRAVENOUS | Status: DC | PRN

## 2021-09-06 MED ORDER — HEMOSTATIC AGENTS (NO CHARGE) OPTIME
TOPICAL | Status: DC | PRN
  Administered 2021-09-06: 1 via TOPICAL

## 2021-09-06 MED ORDER — DEXMEDETOMIDINE HCL IN NACL 400 MCG/100ML IV SOLN
0.4000 ug/kg/h | INTRAVENOUS | Status: DC
  Administered 2021-09-06 (×2): 0.07 ug/kg/h via INTRAVENOUS
  Administered 2021-09-06 – 2021-09-08 (×11): 0.7 ug/kg/h via INTRAVENOUS
  Administered 2021-09-09: 0.8 ug/kg/h via INTRAVENOUS
  Administered 2021-09-09: 0.2 ug/kg/h via INTRAVENOUS
  Filled 2021-09-06 (×16): qty 100

## 2021-09-06 MED ORDER — SODIUM BICARBONATE 8.4 % IV SOLN
INTRAVENOUS | Status: DC | PRN
  Administered 2021-09-06: 50 meq via INTRAVENOUS

## 2021-09-06 MED ORDER — CEFAZOLIN SODIUM-DEXTROSE 2-4 GM/100ML-% IV SOLN
2.0000 g | Freq: Three times a day (TID) | INTRAVENOUS | Status: DC
  Administered 2021-09-06 – 2021-09-07 (×2): 2 g via INTRAVENOUS
  Filled 2021-09-06 (×3): qty 100

## 2021-09-06 MED ORDER — OXYCODONE HCL 5 MG PO TABS: 5.0000 mg | ORAL_TABLET | ORAL | Status: DC | PRN

## 2021-09-06 MED ORDER — VANCOMYCIN HCL IN DEXTROSE 1-5 GM/200ML-% IV SOLN
1000.0000 mg | Freq: Once | INTRAVENOUS | Status: AC
  Administered 2021-09-06: 1000 mg via INTRAVENOUS
  Filled 2021-09-06: qty 200

## 2021-09-06 MED ORDER — BISACODYL 10 MG RE SUPP
10.0000 mg | Freq: Every day | RECTAL | Status: DC
  Administered 2021-09-07 – 2021-09-15 (×8): 10 mg via RECTAL
  Filled 2021-09-06 (×8): qty 1

## 2021-09-06 MED ORDER — NOREPINEPHRINE 4 MG/250ML-% IV SOLN
0.0000 ug/min | INTRAVENOUS | Status: DC
  Administered 2021-09-06: 19 ug/min via INTRAVENOUS
  Administered 2021-09-06: 22 ug/min via INTRAVENOUS
  Administered 2021-09-07: 25 ug/min via INTRAVENOUS
  Administered 2021-09-07: 23 ug/min via INTRAVENOUS
  Filled 2021-09-06 (×4): qty 250

## 2021-09-06 MED ORDER — MAGNESIUM SULFATE 4 GM/100ML IV SOLN
4.0000 g | Freq: Once | INTRAVENOUS | Status: AC
  Administered 2021-09-06: 4 g via INTRAVENOUS
  Filled 2021-09-06: qty 100

## 2021-09-06 MED ORDER — ATORVASTATIN CALCIUM 80 MG PO TABS
80.0000 mg | ORAL_TABLET | Freq: Every day | ORAL | Status: DC
  Administered 2021-09-07 – 2021-09-22 (×16): 80 mg
  Filled 2021-09-06 (×15): qty 1

## 2021-09-06 MED ORDER — PHENYLEPHRINE HCL (PRESSORS) 10 MG/ML IV SOLN
INTRAVENOUS | Status: AC
  Filled 2021-09-06: qty 2

## 2021-09-06 MED ORDER — SODIUM BICARBONATE 8.4 % IV SOLN
50.0000 meq | Freq: Once | INTRAVENOUS | Status: AC
  Administered 2021-09-06: 50 meq via INTRAVENOUS
  Filled 2021-09-06: qty 50

## 2021-09-06 MED ORDER — DEXTROSE 50 % IV SOLN: 0.0000 mL | INTRAVENOUS | Status: DC | PRN

## 2021-09-06 MED ORDER — ONDANSETRON HCL 4 MG/2ML IJ SOLN: 4.0000 mg | Freq: Four times a day (QID) | INTRAMUSCULAR | Status: DC | PRN

## 2021-09-06 MED ORDER — PHENYLEPHRINE HCL-NACL 20-0.9 MG/250ML-% IV SOLN
0.0000 ug/min | INTRAVENOUS | Status: DC
Start: 2021-09-06 — End: 2021-09-07
  Administered 2021-09-06: 20 ug/min via INTRAVENOUS

## 2021-09-06 MED ORDER — HEPARIN SODIUM (PORCINE) 1000 UNIT/ML IJ SOLN
INTRAMUSCULAR | Status: DC | PRN
Start: 2021-09-06 — End: 2021-09-06
  Administered 2021-09-06: 47000 [IU] via INTRAVENOUS

## 2021-09-06 MED ORDER — BISACODYL 5 MG PO TBEC
10.0000 mg | DELAYED_RELEASE_TABLET | Freq: Every day | ORAL | Status: DC
  Administered 2021-09-18: 10 mg via ORAL
  Filled 2021-09-06 (×2): qty 2

## 2021-09-06 MED ORDER — PANTOPRAZOLE SODIUM 40 MG PO TBEC: 40.0000 mg | DELAYED_RELEASE_TABLET | Freq: Every day | ORAL | Status: DC

## 2021-09-06 MED ORDER — METOPROLOL TARTRATE 12.5 MG HALF TABLET: 12.5000 mg | ORAL_TABLET | Freq: Two times a day (BID) | ORAL | Status: DC

## 2021-09-06 MED ORDER — POTASSIUM CHLORIDE 10 MEQ/50ML IV SOLN: 10.0000 meq | INTRAVENOUS | Status: AC

## 2021-09-06 MED ORDER — FENTANYL 2500MCG IN NS 250ML (10MCG/ML) PREMIX INFUSION
25.0000 ug/h | INTRAVENOUS | Status: DC
  Administered 2021-09-06: 25 ug/h via INTRAVENOUS
  Administered 2021-09-07 (×2): 150 ug/h via INTRAVENOUS
  Administered 2021-09-08: 300 ug/h via INTRAVENOUS
  Administered 2021-09-08: 250 ug/h via INTRAVENOUS
  Administered 2021-09-08: 300 ug/h via INTRAVENOUS
  Administered 2021-09-09: 275 ug/h via INTRAVENOUS
  Administered 2021-09-09 – 2021-09-10 (×3): 300 ug/h via INTRAVENOUS
  Administered 2021-09-10: 175 ug/h via INTRAVENOUS
  Administered 2021-09-13: 25 ug/h via INTRAVENOUS
  Administered 2021-09-14 – 2021-09-15 (×2): 75 ug/h via INTRAVENOUS
  Administered 2021-09-16: 175 ug/h via INTRAVENOUS
  Administered 2021-09-16: 150 ug/h via INTRAVENOUS
  Administered 2021-09-17 (×2): 175 ug/h via INTRAVENOUS
  Filled 2021-09-06 (×17): qty 250

## 2021-09-06 MED ORDER — FENTANYL CITRATE (PF) 250 MCG/5ML IJ SOLN
INTRAMUSCULAR | Status: DC | PRN
  Administered 2021-09-06: 150 ug via INTRAVENOUS
  Administered 2021-09-06: 100 ug via INTRAVENOUS
  Administered 2021-09-06: 250 ug via INTRAVENOUS
  Administered 2021-09-06 (×2): 50 ug via INTRAVENOUS

## 2021-09-06 MED ORDER — ACETAMINOPHEN 160 MG/5ML PO SOLN
650.0000 mg | Freq: Once | ORAL | Status: AC
  Administered 2021-09-06: 650 mg

## 2021-09-06 MED ORDER — LACTATED RINGERS IV SOLN: INTRAVENOUS | Status: DC

## 2021-09-06 MED ORDER — SODIUM CHLORIDE 0.9% FLUSH: 3.0000 mL | INTRAVENOUS | Status: DC | PRN

## 2021-09-06 MED ORDER — MIDAZOLAM-SODIUM CHLORIDE 100-0.9 MG/100ML-% IV SOLN
0.5000 mg/h | INTRAVENOUS | Status: DC
Start: 2021-09-06 — End: 2021-09-15
  Administered 2021-09-08: 0.5 mg/h via INTRAVENOUS
  Filled 2021-09-06: qty 100

## 2021-09-06 MED ORDER — METOPROLOL TARTRATE 5 MG/5ML IV SOLN: 2.5000 mg | INTRAVENOUS | Status: DC | PRN

## 2021-09-06 MED ORDER — VASOPRESSIN 20 UNIT/ML IV SOLN
INTRAVENOUS | Status: DC | PRN
  Administered 2021-09-06: 4 [IU] via INTRAVENOUS

## 2021-09-06 MED ORDER — SODIUM CHLORIDE 0.9 % IV SOLN: INTRAVENOUS | Status: DC | PRN

## 2021-09-06 MED ORDER — FAMOTIDINE 20 MG IN NS 100 ML IVPB
20.0000 mg | Freq: Two times a day (BID) | INTRAVENOUS | Status: AC
  Administered 2021-09-06 – 2021-09-07 (×2): 20 mg via INTRAVENOUS
  Filled 2021-09-06 (×2): qty 100

## 2021-09-06 MED ORDER — LACTATED RINGERS IV SOLN: 500.0000 mL | Freq: Once | INTRAVENOUS | Status: DC | PRN

## 2021-09-06 MED ORDER — PROTAMINE SULFATE 10 MG/ML IV SOLN
INTRAVENOUS | Status: AC
  Filled 2021-09-06: qty 50

## 2021-09-06 MED ORDER — PROPOFOL 10 MG/ML IV BOLUS
INTRAVENOUS | Status: AC
  Filled 2021-09-06: qty 20

## 2021-09-06 MED ORDER — CHLORHEXIDINE GLUCONATE 0.12 % MT SOLN
15.0000 mL | Freq: Once | OROMUCOSAL | Status: AC
  Administered 2021-09-06: 15 mL via OROMUCOSAL
  Filled 2021-09-06: qty 15

## 2021-09-06 MED ORDER — PLASMA-LYTE A IV SOLN
INTRAVENOUS | Status: DC | PRN
  Administered 2021-09-06: 1000 mL

## 2021-09-06 MED ORDER — UMECLIDINIUM-VILANTEROL 62.5-25 MCG/INH IN AEPB
1.0000 | INHALATION_SPRAY | Freq: Every day | RESPIRATORY_TRACT | Status: DC
  Filled 2021-09-06: qty 14

## 2021-09-06 MED ORDER — PHENYLEPHRINE 40 MCG/ML (10ML) SYRINGE FOR IV PUSH (FOR BLOOD PRESSURE SUPPORT)
PREFILLED_SYRINGE | INTRAVENOUS | Status: AC
  Filled 2021-09-06: qty 10

## 2021-09-06 MED ORDER — ~~LOC~~ CARDIAC SURGERY, PATIENT & FAMILY EDUCATION
Freq: Once | Status: DC
  Filled 2021-09-06: qty 1

## 2021-09-06 MED ORDER — VASOPRESSIN 20 UNITS/100 ML INFUSION FOR SHOCK
0.0000 [IU]/min | INTRAVENOUS | Status: DC
  Administered 2021-09-06 – 2021-09-07 (×2): 0.03 [IU]/min via INTRAVENOUS
  Administered 2021-09-08: 0.01 [IU]/min via INTRAVENOUS
  Filled 2021-09-06 (×3): qty 100

## 2021-09-06 MED ORDER — METOPROLOL TARTRATE 25 MG/10 ML ORAL SUSPENSION: 12.5000 mg | Freq: Two times a day (BID) | ORAL | Status: DC

## 2021-09-06 MED ORDER — ATORVASTATIN CALCIUM 80 MG PO TABS: 80.0000 mg | ORAL_TABLET | Freq: Every day | ORAL | Status: DC

## 2021-09-06 MED ORDER — 0.9 % SODIUM CHLORIDE (POUR BTL) OPTIME
TOPICAL | Status: DC | PRN
Start: 2021-09-06 — End: 2021-09-06
  Administered 2021-09-06: 5000 mL

## 2021-09-06 MED ORDER — SODIUM CHLORIDE 0.45 % IV SOLN: INTRAVENOUS | Status: DC | PRN

## 2021-09-06 MED ORDER — MILRINONE LACTATE IN DEXTROSE 20-5 MG/100ML-% IV SOLN
0.1250 ug/kg/min | INTRAVENOUS | Status: DC
  Administered 2021-09-06 – 2021-09-15 (×31): 0.375 ug/kg/min via INTRAVENOUS
  Administered 2021-09-15 (×2): 0.25 ug/kg/min via INTRAVENOUS
  Administered 2021-09-16 – 2021-09-17 (×2): 0.125 ug/kg/min via INTRAVENOUS
  Filled 2021-09-06 (×35): qty 100

## 2021-09-06 MED ORDER — INSULIN REGULAR(HUMAN) IN NACL 100-0.9 UT/100ML-% IV SOLN
INTRAVENOUS | Status: DC
  Administered 2021-09-07: 4.2 [IU]/h via INTRAVENOUS
  Filled 2021-09-06: qty 100

## 2021-09-06 MED ORDER — MIDAZOLAM HCL (PF) 5 MG/ML IJ SOLN
INTRAMUSCULAR | Status: DC | PRN
  Administered 2021-09-06: 3 mg via INTRAVENOUS
  Administered 2021-09-06 (×2): 2 mg via INTRAVENOUS

## 2021-09-06 MED ORDER — MORPHINE SULFATE (PF) 2 MG/ML IV SOLN
1.0000 mg | INTRAVENOUS | Status: DC | PRN
  Administered 2021-09-06 – 2021-09-12 (×3): 2 mg via INTRAVENOUS
  Administered 2021-09-13 (×2): 1 mg via INTRAVENOUS
  Administered 2021-09-13 – 2021-09-21 (×4): 2 mg via INTRAVENOUS
  Filled 2021-09-06: qty 2
  Filled 2021-09-06 (×6): qty 1
  Filled 2021-09-06: qty 2
  Filled 2021-09-06 (×2): qty 1

## 2021-09-06 MED ORDER — THROMBIN 20000 UNITS EX SOLR
OROMUCOSAL | Status: DC | PRN
  Administered 2021-09-06: 4 mL via TOPICAL
  Administered 2021-09-06: 12 mL via TOPICAL

## 2021-09-06 MED ORDER — ALBUMIN HUMAN 5 % IV SOLN: 250.0000 mL | INTRAVENOUS | Status: DC | PRN

## 2021-09-06 MED ORDER — ROCURONIUM BROMIDE 10 MG/ML (PF) SYRINGE
PREFILLED_SYRINGE | INTRAVENOUS | Status: DC | PRN
  Administered 2021-09-06: 70 mg via INTRAVENOUS
  Administered 2021-09-06 (×2): 50 mg via INTRAVENOUS
  Administered 2021-09-06: 30 mg via INTRAVENOUS

## 2021-09-06 MED ORDER — ASPIRIN 81 MG PO CHEW
324.0000 mg | CHEWABLE_TABLET | Freq: Every day | ORAL | Status: DC
  Administered 2021-09-07 – 2021-09-10 (×4): 324 mg
  Administered 2021-09-11: 81 mg
  Administered 2021-09-12 – 2021-09-15 (×4): 324 mg
  Filled 2021-09-06 (×9): qty 4

## 2021-09-06 MED ORDER — ROCURONIUM BROMIDE 10 MG/ML (PF) SYRINGE
PREFILLED_SYRINGE | INTRAVENOUS | Status: AC
  Filled 2021-09-06: qty 20

## 2021-09-06 MED ORDER — TRANEXAMIC ACID 1000 MG/10ML IV SOLN
1.5000 mg/kg/h | INTRAVENOUS | Status: DC
  Filled 2021-09-06: qty 25

## 2021-09-06 SURGICAL SUPPLY — 98 items
ADAPTER CARDIO PERF ANTE/RETRO (ADAPTER) ×4 IMPLANT
ARTICLIP LAA PROCLIP II 40 (Clip) ×4 IMPLANT
BAG DECANTER FOR FLEXI CONT (MISCELLANEOUS) ×4 IMPLANT
BIOPATCH RED 1 DISK 7.0 (GAUZE/BANDAGES/DRESSINGS) ×4 IMPLANT
BLADE CLIPPER SURG (BLADE) ×4 IMPLANT
BLADE STERNUM SYSTEM 6 (BLADE) ×4 IMPLANT
BLADE SURG 15 STRL LF DISP TIS (BLADE) ×3 IMPLANT
BLADE SURG 15 STRL SS (BLADE) ×4
BLOOD HAEMOCONCENTR 700 MIDI (MISCELLANEOUS) ×4 IMPLANT
CANISTER SUCT 3000ML PPV (MISCELLANEOUS) ×4 IMPLANT
CANN PRFSN 3/8XCNCT ST RT ANG (MISCELLANEOUS) ×3
CANNULA GUNDRY RCSP 15FR (MISCELLANEOUS) ×4 IMPLANT
CANNULA PRFSN 3/8XCNCT RT ANG (MISCELLANEOUS) ×3 IMPLANT
CANNULA SUMP PERICARDIAL (CANNULA) ×4 IMPLANT
CANNULA VEN MTL TIP RT (MISCELLANEOUS) ×4
CANNULA VRC MALB SNGL STG 36FR (MISCELLANEOUS) ×3 IMPLANT
CARDIOBLATE CARDIAC ABLATION (MISCELLANEOUS)
CATH HEART VENT LEFT (CATHETERS) ×3 IMPLANT
CATH ROBINSON RED A/P 18FR (CATHETERS) ×12 IMPLANT
CATH THORACIC 36FR (CATHETERS) ×4 IMPLANT
CATH THORACIC 36FR RT ANG (CATHETERS) ×4 IMPLANT
CLAMP ISOLATOR SYNERGY LG (MISCELLANEOUS) ×4 IMPLANT
CNTNR URN SCR LID CUP LEK RST (MISCELLANEOUS) ×6 IMPLANT
CONN ST 3/8 X 1/2 (MISCELLANEOUS) ×8 IMPLANT
CONT SPEC 4OZ STRL OR WHT (MISCELLANEOUS) ×8
CONTAINER PROTECT SURGISLUSH (MISCELLANEOUS) ×4 IMPLANT
COVER SURGICAL LIGHT HANDLE (MISCELLANEOUS) ×4 IMPLANT
DEVICE ATRICLIP LAA PRCLPII 40 (Clip) ×3 IMPLANT
DEVICE CARDIOBLATE CARDIAC ABL (MISCELLANEOUS) IMPLANT
DEVICE SUT CK QUICK LOAD INDV (Prosthesis & Implant Heart) ×12 IMPLANT
DEVICE SUT CK QUICK LOAD MINI (Prosthesis & Implant Heart) ×4 IMPLANT
DRAPE CARDIOVASCULAR INCISE (DRAPES) ×4
DRAPE SRG 135X102X78XABS (DRAPES) ×3 IMPLANT
DRAPE WARM FLUID 44X44 (DRAPES) ×4 IMPLANT
DRSG COVADERM 4X14 (GAUZE/BANDAGES/DRESSINGS) ×4 IMPLANT
ELECT CAUTERY BLADE 6.4 (BLADE) ×4 IMPLANT
ELECT REM PT RETURN 9FT ADLT (ELECTROSURGICAL) ×8
ELECTRODE REM PT RTRN 9FT ADLT (ELECTROSURGICAL) ×6 IMPLANT
FELT TEFLON 1X6 (MISCELLANEOUS) ×4 IMPLANT
GAUZE SPONGE 4X4 12PLY STRL (GAUZE/BANDAGES/DRESSINGS) ×4 IMPLANT
GAUZE SPONGE 4X4 12PLY STRL LF (GAUZE/BANDAGES/DRESSINGS) ×4 IMPLANT
GLOVE SURG ENC MOIS LTX SZ6 (GLOVE) IMPLANT
GLOVE SURG ENC MOIS LTX SZ6.5 (GLOVE) IMPLANT
GLOVE SURG ENC MOIS LTX SZ7 (GLOVE) IMPLANT
GLOVE SURG ENC MOIS LTX SZ7.5 (GLOVE) IMPLANT
GLOVE SURG MICRO LTX SZ6.5 (GLOVE) ×8 IMPLANT
GLOVE SURG MICRO LTX SZ7 (GLOVE) ×8 IMPLANT
GOWN STRL REUS W/ TWL LRG LVL3 (GOWN DISPOSABLE) ×15 IMPLANT
GOWN STRL REUS W/ TWL XL LVL3 (GOWN DISPOSABLE) ×6 IMPLANT
GOWN STRL REUS W/TWL LRG LVL3 (GOWN DISPOSABLE) ×20
GOWN STRL REUS W/TWL XL LVL3 (GOWN DISPOSABLE) ×8
HEMOSTAT POWDER SURGIFOAM 1G (HEMOSTASIS) ×16 IMPLANT
HEMOSTAT SURGICEL 2X14 (HEMOSTASIS) ×4 IMPLANT
INSERT FOGARTY XLG (MISCELLANEOUS) ×4 IMPLANT
KIT BASIN OR (CUSTOM PROCEDURE TRAY) ×4 IMPLANT
KIT CATH CPB BARTLE (MISCELLANEOUS) ×4 IMPLANT
KIT SUCTION CATH 14FR (SUCTIONS) ×4 IMPLANT
KIT SUT CK MINI COMBO 4X17 (Prosthesis & Implant Heart) ×4 IMPLANT
KIT TURNOVER KIT B (KITS) ×4 IMPLANT
LINE VENT (MISCELLANEOUS) ×4 IMPLANT
NS IRRIG 1000ML POUR BTL (IV SOLUTION) ×24 IMPLANT
PACK E OPEN HEART (SUTURE) ×4 IMPLANT
PACK OPEN HEART (CUSTOM PROCEDURE TRAY) ×4 IMPLANT
PAD ARMBOARD 7.5X6 YLW CONV (MISCELLANEOUS) ×8 IMPLANT
POSITIONER HEAD DONUT 9IN (MISCELLANEOUS) ×4 IMPLANT
PROBE CRYO2-ABLATION MALLABLE (MISCELLANEOUS) ×4 IMPLANT
SET MPS 3-ND DEL (MISCELLANEOUS) ×4 IMPLANT
STOPCOCK 4 WAY LG BORE MALE ST (IV SETS) ×4 IMPLANT
SUT BONE WAX W31G (SUTURE) ×4 IMPLANT
SUT EB EXC GRN/WHT 2-0 V-5 (SUTURE) ×16 IMPLANT
SUT ETHIBON EXCEL 2-0 V-5 (SUTURE) ×12 IMPLANT
SUT ETHIBOND V-5 VALVE (SUTURE) ×8 IMPLANT
SUT PROLENE 3 0 SH DA (SUTURE) ×20 IMPLANT
SUT PROLENE 3 0 SH1 36 (SUTURE) ×4 IMPLANT
SUT PROLENE 4 0 RB 1 (SUTURE) ×32
SUT PROLENE 4-0 RB1 .5 CRCL 36 (SUTURE) ×24 IMPLANT
SUT SILK 2 0 SH CR/8 (SUTURE) ×4 IMPLANT
SUT STEEL 6MS V (SUTURE) IMPLANT
SUT STEEL STERNAL CCS#1 18IN (SUTURE) ×4 IMPLANT
SUT STEEL SZ 6 DBL 3X14 BALL (SUTURE) ×8 IMPLANT
SUT TEM PAC WIRE 2 0 SH (SUTURE) ×4 IMPLANT
SUT VIC AB 1 CTX 36 (SUTURE) ×16
SUT VIC AB 1 CTX36XBRD ANBCTR (SUTURE) ×12 IMPLANT
SYR 20CC LL (SYRINGE) ×4 IMPLANT
SYSTEM SAHARA CHEST DRAIN ATS (WOUND CARE) ×4 IMPLANT
TAPE CLOTH SURG 4X10 WHT LF (GAUZE/BANDAGES/DRESSINGS) ×4 IMPLANT
TAPE PAPER 2X10 WHT MICROPORE (GAUZE/BANDAGES/DRESSINGS) ×4 IMPLANT
TOWEL GREEN STERILE (TOWEL DISPOSABLE) ×4 IMPLANT
TOWEL GREEN STERILE FF (TOWEL DISPOSABLE) ×4 IMPLANT
TRAY CATH LUMEN 1 20CM STRL (SET/KITS/TRAYS/PACK) ×4 IMPLANT
TRAY FOLEY SLVR 16FR TEMP STAT (SET/KITS/TRAYS/PACK) ×4 IMPLANT
TUBING ART PRESS 72  MALE/FEM (TUBING) ×8
TUBING ART PRESS 72 MALE/FEM (TUBING) ×6 IMPLANT
UNDERPAD 30X36 HEAVY ABSORB (UNDERPADS AND DIAPERS) ×4 IMPLANT
VALVE AORTIC SZ27 INSP/RESIL (Valve) ×4 IMPLANT
VENT LEFT HEART 12002 (CATHETERS) ×4
VRC MALLEABLE SINGLE STG 36FR (MISCELLANEOUS) ×4
WATER STERILE IRR 1000ML POUR (IV SOLUTION) ×8 IMPLANT

## 2021-09-06 NOTE — Anesthesia Procedure Notes (Signed)
Central Venous Catheter Insertion Performed by: Effie Berkshire, MD, anesthesiologist Start/End09/06/2021 8:40 AM, 09/26/2021 8:50 AM Patient location: Pre-op. Preanesthetic checklist: patient identified, IV checked, site marked, risks and benefits discussed, surgical consent, monitors and equipment checked, pre-op evaluation, timeout performed and anesthesia consent Position: Trendelenburg Lidocaine 1% used for infiltration and patient sedated Hand hygiene performed , maximum sterile barriers used  and Seldinger technique used Catheter size: 8.5 Fr Total catheter length 10. Central line was placed.Sheath introducer Swan type:thermodilution PA Cath depth:50 Procedure performed using ultrasound guided technique. Ultrasound Notes:anatomy identified, needle tip was noted to be adjacent to the nerve/plexus identified, no ultrasound evidence of intravascular and/or intraneural injection and image(s) printed for medical record Attempts: 1 Following insertion, line sutured and dressing applied. Post procedure assessment: blood return through all ports, free fluid flow and no air  Patient tolerated the procedure well with no immediate complications.

## 2021-09-06 NOTE — Discharge Instructions (Signed)

## 2021-09-06 NOTE — Anesthesia Preprocedure Evaluation (Addendum)
Anesthesia Evaluation  Patient identified by MRN, date of birth, ID band Patient awake    Reviewed: Allergy & Precautions, NPO status , Patient's Chart, lab work & pertinent test results  Airway Mallampati: II  TM Distance: >3 FB Neck ROM: Full    Dental  (+) Teeth Intact, Dental Advisory Given   Pulmonary sleep apnea , COPD, former smoker,    breath sounds clear to auscultation       Cardiovascular hypertension, + CAD  + dysrhythmias Atrial Fibrillation + Valvular Problems/Murmurs AS  Rhythm:Regular Rate:Normal + Systolic murmurs    Neuro/Psych Anxiety  Neuromuscular disease    GI/Hepatic Neg liver ROS, GERD  ,  Endo/Other  diabetes  Renal/GU Renal InsufficiencyRenal disease     Musculoskeletal  (+) Arthritis ,   Abdominal Normal abdominal exam  (+)   Peds  Hematology negative hematology ROS (+)   Anesthesia Other Findings   Reproductive/Obstetrics                            Anesthesia Physical Anesthesia Plan  ASA: 4  Anesthesia Plan: General   Post-op Pain Management:    Induction: Intravenous  PONV Risk Score and Plan: 3 and Ondansetron, Dexamethasone and Midazolam  Airway Management Planned: Oral ETT  Additional Equipment: Arterial line, CVP, TEE and Ultrasound Guidance Line Placement  Intra-op Plan:   Post-operative Plan: Post-operative intubation/ventilation  Informed Consent: I have reviewed the patients History and Physical, chart, labs and discussed the procedure including the risks, benefits and alternatives for the proposed anesthesia with the patient or authorized representative who has indicated his/her understanding and acceptance.     Dental advisory given  Plan Discussed with: CRNA  Anesthesia Plan Comments:        Anesthesia Quick Evaluation

## 2021-09-06 NOTE — Anesthesia Procedure Notes (Signed)
Arterial Line Insertion Start/End9/07/2021 9:04 AM Performed by: Valda Favia, CRNA, CRNA  Patient location: Pre-op. Preanesthetic checklist: patient identified, IV checked, site marked, risks and benefits discussed, surgical consent, monitors and equipment checked, pre-op evaluation, timeout performed and anesthesia consent Lidocaine 1% used for infiltration and patient sedated Left, radial was placed Catheter size: 20 G Hand hygiene performed , maximum sterile barriers used  and Seldinger technique used Allen's test indicative of satisfactory collateral circulation Attempts: 1 Procedure performed without using ultrasound guided technique. Following insertion, dressing applied and Biopatch. Post procedure assessment: normal  Patient tolerated the procedure well with no immediate complications.

## 2021-09-06 NOTE — Anesthesia Procedure Notes (Addendum)
Central Venous Catheter Insertion Performed by: Effie Berkshire, MD, anesthesiologist Start/End09/12/2020 9:45 AM, 09/08/2021 9:50 AM Patient location: Pre-op. Preanesthetic checklist: patient identified, IV checked, site marked, risks and benefits discussed, surgical consent, monitors and equipment checked, pre-op evaluation, timeout performed and anesthesia consent Hand hygiene performed  and maximum sterile barriers used  PA cath was placed.Swan type:thermodilution Procedure performed without using ultrasound guided technique. Attempts: 1 Patient tolerated the procedure well with no immediate complications.

## 2021-09-06 NOTE — Anesthesia Procedure Notes (Signed)
Procedure Name: Intubation Date/Time: 09/10/2021 9:40 AM Performed by: Valda Favia, CRNA Pre-anesthesia Checklist: Patient identified, Emergency Drugs available, Suction available, Patient being monitored and Timeout performed Patient Re-evaluated:Patient Re-evaluated prior to induction Oxygen Delivery Method: Circle system utilized Preoxygenation: Pre-oxygenation with 100% oxygen Induction Type: IV induction Ventilation: Oral airway inserted - appropriate to patient size and Two handed mask ventilation required Laryngoscope Size: Mac and 4 Grade View: Grade II Tube type: Oral Tube size: 8.0 mm Number of attempts: 2 Airway Equipment and Method: Stylet Placement Confirmation: ETT inserted through vocal cords under direct vision, positive ETCO2 and breath sounds checked- equal and bilateral Secured at: 23 cm Tube secured with: Tape Dental Injury: Teeth and Oropharynx as per pre-operative assessment  Comments: DLx1 A. Jurney Overacker Grade III view ETT placed and removed no EtCO2, resumed mask ventilation with oral airway, DLx2 by Smith Robert, MD Grade II view ETT placed +EtCO2 bilateral breath sounds present and equal

## 2021-09-06 NOTE — Interval H&P Note (Signed)
History and Physical Interval Note:  09/14/2021 7:49 AM  [name] [name].  has presented today for surgery, with the diagnosis of AI, AFIB.  The various methods of treatment have been discussed with the patient and family. After consideration of risks, benefits and other options for treatment, the patient has consented to  Procedure(s): AORTIC VALVE REPLACEMENT (AVR) (N/A) MAZE (N/A) TRANSESOPHAGEAL ECHOCARDIOGRAM (TEE) (N/A) as a surgical intervention.  The patient's history has been reviewed, patient examined, no change in status, stable for surgery.  I have reviewed the patient's chart and labs.  Questions were answered to the patient's satisfaction.     [name]

## 2021-09-06 NOTE — Hospital Course (Addendum)
HPI:   The patient is a 70 year old gentleman with a history of paroxysmal atrial fibrillation status post multiple cardioversions in the past who has been maintained on Tikosyn, coronary disease status post stenting of his LAD in 2003, hyperlipidemia, OSA on CPAP, who had MSSA sepsis with development of lumbar discitis and an infected left sternoclavicular joint in 08/2019.  He underwent incision and drainage and debridement of left sternoclavicular joint with application of a wound VAC by Dr. Servando Snare on 08/31/2019.  He was treated with a long course of intravenous antibiotics followed by oral Keflex.  This resolved without difficulty.  A CT scan of the chest also showed a mildly dilated ascending aorta measuring 4.4 cm.  He had an echocardiogram on 08/29/2019 when he presented with sepsis and this showed a trileaflet aortic valve with mild sclerosis and trivial regurgitation.  There is no evidence of stenosis.  There is no vegetation on the aortic valve.  His left ventricular ejection fraction at that time was 60 to 65% with normal LV cavity size measured at about 5 cm during diastole.  He now presents with recent onset of exertional dyspnea and fatigue.  A 2D echocardiogram on 05/25/2021 showed moderate to severe aortic insufficiency with a pressure half-time of 307 ms.  Mean gradient across aortic valve was 10 mmHg and a peak gradient was 21.2 mmHg.  Left ventricular ejection fraction was 55 to 60% with new dilatation of the LV cavity measured at 6.5 cm during diastole and 5.1 cm during systole.  There was trivial mitral regurgitation and mild tricuspid regurgitation.  Right ventricular function was normal.  He subsequently had a TEE performed on 07/10/2021 which showed an ejection fraction of 55 to 60%.  There is mild dilatation of the ascending aorta at 4.3 cm.  There is severe aortic insufficiency with pressure half-time of 485 ms.  The aortic root diameter is measured at 3.6 cm and the ascending aorta measured  at 4.2 cm.  He subsequently underwent cardiac catheterization showing that the previously placed mid LAD stent was widely patent.  There is moderate pulmonary hypertension and 67/20 with a mean of 39.  Mean pulmonary capillary wedge pressure was 17 mmHg.  There is mild diffuse nonobstructive coronary disease. Patient symptoms include shortness of breath, orthopnea, and fatigue. Dr. [name] discussed the need for aortic valve replacement and MAZE.  Hospital Course: Patient underwent an AVR, Maze, LA clip on 09/05/2021. Patient was transferred from the OR to [facility] ICU in critical but stable condition.  The patient had difficulty weaning from bypass.  He required Epinephrine, Milrinone, Levophed, and Nitric oxide post operatively.  He remains sedated on vent.  His pulmonary artery pressures decreased which was felt to be related to volume.  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  He has underlying COPD with severe defect.  This did not improve with replacement of his Aortic valve which eliminated his severe aortic insufficiency.  Critical care consult was obtained to help with ventilatory and pulmonary disease management.  He has underlying CKD and developed an AKI with creatinine level peaking at 1.98.  The patient had vegetations on his valve intraoperatively.  Cultures were taken which showed no bacterial growth.  However he was placed on empiric antibiotics to treat the possibility of active endocarditis infection.  The patient developed episodes of hypotension overnight resulting in treatment with Albumin and increase of his Levophed.  Advanced heart failure evaluated the patient  and recommended repeat Echocardiogram which showed global hypokinesis, systolic function worse, LVEF 40-45%, severe mitral annular calcification, mild dilation of the aortic root, and no pericardial effusion.  They recommended initiating diuretics.  They recommended  initiation of Sildenafil once off pressors and Nitric Oxide for his pulmonary hypertension.  He has long standing history of A. Fib and was being A-V paced post operatively.  He will require anticoagulation once he is more stable. He was continued on Milrinone and Nitric Oxide.He remained NPO and tube feedings were continued.  On postop day 5 he remained stable hemodynamically and advanced heart failure continues to help manage weaning the drips.  He continues to require diuresis and CCM is managing the ventilator.  Operative cultures remain negative for organisms but do show WBCs on gram stain.  He was continued on current antibiotic regimen of ceftriaxone and vancomycin.  He received nutrition via tube feedings while on the ventilator.  The patient was started on Sildenafil on 9/14 to help wean Nitric oxide.  Infectious disease followed the patient closely.  Being the cultures obtained in the operating room showed no bacterial growth they did not feel this was endocarditis and stopped his antibiotics on 9/14. In the early morning of post-op day 7 he developed hypoxia. A CXR was obtained and revealed near complete opacification of the left chest. The critical care team conducted urgent bronchoscopy and evacuated thick secretions that were obstructing the left main stem bronchus. The hypoxia improved immediately and the follow up CXR showed improved aeration of the left hemithorax. He remained on the ventilator and nutrition was supported with enteral feeding via Cortrak tube. Continues to require inotropic support for high output, low SVR physiology.  Midodrine was added to increase SVR.  He continued in atrial fibrillation that was mostly rate controlled.  He was anticoagulated with Coumadin and daily INR was monitored with adjustments in Coumadin dosing as required.  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  He was diuresed aggressively.  Pulmonary medicine/critical care followed closely.  Nutritional support bowel with core track was continued.  On 09/17/2021 a thoracentesis was performed by CCM obtaining 1000 cc of bloody appearing fluid from the left pleural space.  He remains on norepinephrine and milrinone with stable MAP, cardiac index 2.3 and cooximetry of 71%.  CVP is 16 and PA pressures are elevated around 40.  Chest x-ray continues to show pulmonary edema.  His heart rate has slowed but is still felt to be in atrial fibrillation and he continues on Coumadin and amiodarone.  The advanced heart failure team continues to manage these issues.  Vancomycin and Cefepime were restarted on 09/16 for HCAP. Lasix drip was decreased on 09/20. Per CCM, trial of extubation to BIPAP. KUB done showed no ileus.  He was continued on tube feedings for nutritional support. He was started in Lasix drip to aid with diuresis but developed elevation in his creatinine and his needed to be held.  The patient converted to NSR with 1st degree AV Block.  He was successfully weaned off all inotropic drips.  He remained hyperglycemic and his insulin dose was increased.  He was continued on Midodrine 5 mg tid. Because of PAF, he was started on Coumadin. PT/INR were monitored daily. He was also on oral Amiodarone.  Unfortunately on 9/24 patient developed worsening respiratory state with desaturations into the 60s.  He was emergently intubated without  difficulty.  He developed hypotension and decreased heart rate which progressed into full arrest.  CPR was initiated and 3 rounds were completed.  Resuscitation was not successful and patient ultimately passed away.

## 2021-09-06 NOTE — Anesthesia Postprocedure Evaluation (Signed)
Anesthesia Post Note  Patient: [name] [name].  Procedure(s) Performed: AORTIC VALVE REPLACEMENT (AVR) USING INSPIRIS AORTIC VALVE 27 MM (Chest) MAZE (Chest) TRANSESOPHAGEAL ECHOCARDIOGRAM (TEE) APPLICATION OF CELL SAVER CLIPPING OF ATRIAL APPENDAGE USING ATRICLIP PRO 240     Patient location during evaluation: SICU Anesthesia Type: General Level of consciousness: sedated Pain management: pain level controlled Vital Signs Assessment: post-procedure vital signs reviewed and stable Respiratory status: patient remains intubated per anesthesia plan Cardiovascular status: stable Postop Assessment: no apparent nausea or vomiting Anesthetic complications: no   No notable events documented.  Last Vitals:  Vitals:   09/16/2021 0814 09/05/2021 1717  BP:  (!) 91/53  Pulse:  90  Resp:  20  Temp:    SpO2: 92% (!) 89%    Last Pain:  Vitals:   09/23/2021 0814  TempSrc:   PainSc: 0-No pain                 Effie Berkshire

## 2021-09-06 NOTE — Progress Notes (Signed)
      [address]       [place] [address]             [phone]       Just back from OR S/p AVR, Maze  Intubated, sedated  BP (!) 91/53   Pulse 90   Temp 98.4 F (36.9 C) (Oral)   Resp 20   Ht '6\' 1"'$  (1.854 m)   Wt 136.1 kg   SpO2 (!) 89%   BMI 39.58 kg/m  60/30 CI 3.4 On milrinone, vasopressin, epi and norepi Also on NO   Intake/Output Summary (Last 24 hours) at 09/18/2021 1832 Last data filed at 09/15/2021 1651 Gross per 24 hour  Intake 4570 ml  Output 3435 ml  Net 1135 ml   Postop lab pending  Plan to keep intubated overnight Continue current management  Lindi Abram C. Roxan Hockey, MD [facility] [phone]

## 2021-09-06 NOTE — Brief Op Note (Signed)
09/27/2021  2:03 PM  PATIENT:  [name] [name].  70 y.o. male  PRE-OPERATIVE DIAGNOSIS:  1. SEVERE AI 2. ATRIAL FIBRILLATION  POST-OPERATIVE DIAGNOSIS:   1. SEVERE AI 2. ATRIAL FIBRILLATION 3. ACUTE?SUBACUTE ENDOCARDITIS  PROCEDURE:  TRANSESOPHAGEAL ECHOCARDIOGRAM (TEE), AORTIC VALVE REPLACEMENT (AVR) (USING INSPIRIS AORTIC VALVE Model # 11500A, Serial # [id number], Size 27 MM), APPLICATION OF CELL SAVER, [employer] CRYO and BIPOLAR MAZE, and  CLIPPING OF LEFT ATRIAL APPENDAGE USING ATRICLIP PRO 240  SURGEON:  Surgeon(s) and Role:     Bartle, Fernande Boyden, MD - Primary  PHYSICIAN ASSISTANT: Lars Pinks PA-C  ASSISTANTS: Farrel Gordon RNFA   ANESTHESIA:   general  EBL:  Per anesthesia, perfusion record  DRAINS:  Chest tubes placed in the mediastinal and pleural spaces    SPECIMEN:  Source of Specimen:  Native AV leaflets  DISPOSITION OF SPECIMEN:   Culture and pathology  COUNTS CORRECT:  YES  DICTATION: .Dragon Dictation  PLAN OF CARE: Admit to inpatient   PATIENT DISPOSITION:  ICU - intubated and hemodynamically stable.   Delay start of Pharmacological VTE agent (>24hrs) due to surgical blood loss or risk of bleeding: yes  BASELINE WEIGHT: 136.1 kg

## 2021-09-06 NOTE — Transfer of Care (Signed)
Immediate Anesthesia Transfer of Care Note  Patient: [name] [name].  Procedure(s) Performed: AORTIC VALVE REPLACEMENT (AVR) USING INSPIRIS AORTIC VALVE 27 MM (Chest) MAZE (Chest) TRANSESOPHAGEAL ECHOCARDIOGRAM (TEE) APPLICATION OF CELL SAVER CLIPPING OF ATRIAL APPENDAGE USING ATRICLIP PRO 240  Patient Location: ICU  Anesthesia Type:General  Level of Consciousness: sedated and Patient remains intubated per anesthesia plan  Airway & Oxygen Therapy: Patient remains intubated per anesthesia plan and Patient placed on Ventilator (see vital sign flow sheet for setting)  Post-op Assessment: Report given to RN and Post -op Vital signs reviewed and stable  Post vital signs: Reviewed and stable  Last Vitals:  Vitals Value Taken Time  BP 91/53 08/31/2021 1717  Temp    Pulse 90 08/31/2021 1717  Resp 20 09/10/2021 1725  SpO2 90 % 09/16/2021 1725  Vitals shown include unvalidated device data.  Last Pain:  Vitals:   09/13/2021 0814  TempSrc:   PainSc: 0-No pain         Complications: No notable events documented.

## 2021-09-06 NOTE — Op Note (Signed)
CARDIOVASCULAR SURGERY OPERATIVE NOTE  08/31/2021 [name] [name] [id number]  Surgeon:  [name], MD  First Assistant: Lars Pinks,  PA-C   Preoperative Diagnosis:  Severe aortic stenosis   Postoperative Diagnosis:  Same   Procedure:  Median Sternotomy Extracorporeal circulation 3.   Aortic valve replacement using a 27 mm Edwards INSPIRIS RESILIA pericardial valve. 4.   Bi-atrial MAZE IV procedure 5.   Insertion of left common femoral arterial line.  Anesthesia:  General Endotracheal   Clinical History/Surgical Indication:  This 70 year old gentleman has stage D, severe, symptomatic aortic insufficiency with New York Heart Association class III symptoms of exertional fatigue and shortness of breath consistent with chronic diastolic congestive heart failure.  He also has history of paroxysmal atrial fibrillation with multiple cardioversions in the past and currently maintained on Tikosyn and Eliquis for anticoagulation.  He has a trileaflet aortic valve with known mild enlargement of the ascending aorta measured at 4.4 cm on recent CTA of the chest which has been unchanged dating back to 2019.  I have personally reviewed his CTA of the chest, echocardiogram, and cardiac catheterization.  His descending aorta at the same level of his mid ascending aorta measures 3.3 cm and he is 6 foot 1 and 300 pounds which makes his 4.4 cm ascending aorta less significant.  His cardiac catheterization does not show any significant coronary artery disease with a patent stent in the LAD.  I think the best treatment for this gentleman is aortic valve replacement using a bioprosthetic valve and biatrial Maze procedure.  I do not think his aorta warrants replacement at his age and would significantly increase the complexity and risk of surgery.  His oxygen saturation on room air today was 82%.  His arterial blood gas during catheterization showed a PO2 of 66 with a sat of 91%.  Chest CT does  show underlying emphysematous change with areas of fibrosis and scarring that has been stable.  Previous pulmonary function testing in March 2019  showed no significant obstructive disease but did show a severe defect in diffusion capacity at 46% of predicted.  He has an appointment later today with pulmonary medicine.  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  I think he would probably qualify for home oxygen and will likely need oxygen after surgery. I discussed the operative procedure with the patient including alternatives, benefits and risks; including but not limited to bleeding, blood transfusion, infection, stroke, myocardial infarction, graft failure, heart block requiring a permanent pacemaker, organ dysfunction, and death.  [name] [name]. understands and agrees to proceed.        Preparation:  The patient was seen in the preoperative holding area and the correct patient, correct operation were confirmed with the patient after reviewing the medical record and catheterization. The consent was signed by me. Preoperative antibiotics were given. A pulmonary arterial line and radial arterial line were placed by the anesthesia team. PA pressure was markedly elevated at 90/45, almost systemic with a CVP of 20. The patient was taken back to the operating room and positioned supine on the operating room table. After being placed under general endotracheal anesthesia by the anesthesia team a foley catheter was placed. The neck, chest, abdomen, and both legs were prepped with betadine soap and solution and draped in the usual sterile manner. A surgical time-out was taken and the correct patient and operative procedure were confirmed with  the nursing and anesthesia staff.   Pre-bypass TEE:   Complete TEE assessment was performed by Dr. Smith Robert. This showed severe AI, mild MR, moderate TR, Normal LV  systolic function with mild enlargement of the RV with moderate dysfunction.    Post-bypass TEE:   Normal functioning prosthetic aortic valve with no perivalvular leak or regurgitation through the valve. Left ventricular function preserved. Trivial mitral regurgitation.    Cardiopulmonary Bypass:  A median sternotomy was performed. The pericardium was opened in the midline. Right ventricular function appeared mildly decreased.  The ascending aorta was of normal size for his BSA of 2.65 and had no palpable plaque. There were no contraindications to aortic cannulation or cross-clamping. The patient was fully systemically heparinized and the ACT was maintained > 400 sec. The proximal aortic arch was cannulated with a 93 F aortic cannula for arterial inflow. Bi-caval venous cannulation was performed via the SVC using a 74F metal tip right angle cannula and IVC using a 36 F plastic malleable cannula.  An antegrade cardioplegia/vent cannula was inserted into the mid-ascending aorta. A retrograde cardioplegia cannnula was placed into the coronary sinus via the right atrium. Aortic occlusion was performed with a single cross-clamp. Systemic cooling to 32 degrees Centigrade and topical cooling of the heart with iced saline were used. Retrograde cold KBC cardioplegia was used per protocol to induce diastolic arrest and then additional doses of retrograde cardioplegia were given at about 60 minute intervals throughout the period of arrest to maintain myocardial temperature at or below 10 degrees centigrade. A temperature probe was inserted into the interventricular septum and an insulating pad was placed in the pericardium. Carbon dioxide was insufflated into the pericardium at 5L/min throughout the procedure to minimize intracardiac air.   Bi-atrial Maze IV:  The left pulmonary veins were circled with a tape. An Atricure bipolar RF clamp was used to create a lesion around the pulmonary veins on the left  atrial wall. Three activations of RF energy were applied with two clampings for each activation. The base of the LAA appendage was measured and a 40 mm Atricure Atriclip Pro 240 clip was applied to the base of the appendage to isolate it. The left atrium was opened by a vertical incision in the interatrial groove. An encircling lesion was then created around the right pulmonary veins using the atriotomy incision anteriorly and a cryo lesion posteriorly. All cryo lesions were for two minutes. Then cryo lesions were created to join the pulmonary vein lesions superiorly and inferiorly creating a posterior box.  A cryo lesion was placed between the inferior lesion that joined the pulmonary veins and the posterior mitral annulus at P2-3. A lesion was placed externally across the coronary sinus overlapping this. The left atrium was closed with two layers of continuous 3-0 prolene suture. The right atrium was opened obliquely  An RF lesion was created to join the SVC and IVC posteriorly along the interatrial septum. These internal/external lesions were two firings and two applications for each lesion.  A cryo lesion was placed from the anterior aspect of this oblique atriotomy down to the tricuspid annulus. Another cryo lesion was placed obliquely across the RAA. The right atriotomy was then closed with two layers of 4-0 prolene suture.     Aortic Valve Replacement:   A transverse aortotomy was performed 1 cm above the take-off of the right coronary artery. The native valve was trileaflet with an 8 mm perforation of the left coronary leaflet with what looked like  vegetation around the edges of the perforation. There were also vegetations on the other two leaflets on the ventricular surface but they did not involve the annulus or the anterior mitral leaflet.  The ostia of the coronary arteries were in normal position and were not obstructed. The native valve leaflets were excised and send to microbiology for GS and  culture.  Care was taken to remove all particulate debris. The left ventricle was directly inspected for debris and then irrigated with ice saline solution. The annulus was sized and a size 27 mm Edwards  INSPIRIS RESILIA pericardial valve was chosen. The model number was [id number] and the serial number was [id number]. While the valve was being prepared 2-0 Ethibond pledgeted horizontal mattress sutures were placed around the annulus with the pledgets in a sub-annular position. The sutures were placed through the sewing ring and the valve lowered into place. The sutures were tied sequentially. The valve seated nicely and the coronary ostia were not obstructed. The prosthetic valve leaflets moved normally and there was no sub-valvular obstruction. The aortotomy was closed using 4-0 Prolene suture in 2 layers with felt strips to reinforce the closure.  Insertion of left common femoral arterial line:  The radial arterial tracing was variable and therefore I decided to insert a femoral arterial line for better BP monitoring. The left common femoral artery was cannulated with a needle using SonoSite guidance. A guidewire was passed without difficulty and a long arterial line was passed without difficulty.    Completion:  The patient was rewarmed to 37 degrees Centigrade. De-airing maneuvers were performed and the head placed in trendelenburg position. The crossclamp was removed with a time of 160 minutes. There was spontaneous return of sinus rhythm. The aortotomy was checked for hemostasis. Two temporary epicardial pacing wires were placed on the right atrium and two on the right ventricle. I opened the pleural spaces a little to see if there was any pleural effusion but it was minimal. The lungs had a cobblestone appearance. The left ventricular vent and retrograde cardioplegia cannulas were removed. The patient was weaned from CPB initially on no drugs but PA pressure was systemic and the right ventricle was  struggling with that. We went back on pump and started Epi, milrinone, NE, and NO 30 ppm. We were then able to successfully wean off pump with PA pressures of 60/30, 2/3 systemic.  CPB time was 259 minutes. Cardiac output was 7 LPM. Heparin was fully reversed with protamine and the aortic and venous cannulas removed. Hemostasis was achieved. Mediastinal drainage tubes were placed. The sternum was closed with double #6 stainless steel wires. The fascia was closed with continuous # 1 vicryl suture. The subcutaneous tissue was closed with 2-0 vicryl continuous suture. The skin was closed with 3-0 vicryl subcuticular suture. All sponge, needle, and instrument counts were reported correct at the end of the case. Dry sterile dressings were placed over the incisions and around the chest tubes which were connected to pleurevac suction. The patient was then transported to the surgical intensive care unit in critical but stable condition.

## 2021-09-07 ENCOUNTER — Ambulatory Visit: Payer: Medicare Other | Admitting: Behavioral Health

## 2021-09-07 ENCOUNTER — Encounter (HOSPITAL_COMMUNITY): Payer: Self-pay | Admitting: Surgery

## 2021-09-07 ENCOUNTER — Inpatient Hospital Stay (HOSPITAL_COMMUNITY): Payer: Medicare Other

## 2021-09-07 DIAGNOSIS — I50811 Acute right heart failure: Secondary | ICD-10-CM | POA: Diagnosis not present

## 2021-09-07 DIAGNOSIS — J9601 Acute respiratory failure with hypoxia: Secondary | ICD-10-CM | POA: Diagnosis not present

## 2021-09-07 DIAGNOSIS — Z953 Presence of xenogenic heart valve: Secondary | ICD-10-CM

## 2021-09-07 DIAGNOSIS — R570 Cardiogenic shock: Secondary | ICD-10-CM | POA: Diagnosis not present

## 2021-09-07 LAB — BASIC METABOLIC PANEL
Anion gap: 8 (ref 5–15)
Anion gap: 6 (ref 5–15)
BUN: 33 mg/dL — ABNORMAL HIGH (ref 8–23)
BUN: 35 mg/dL — ABNORMAL HIGH (ref 8–23)
CO2: 24 mmol/L (ref 22–32)
CO2: 24 mmol/L (ref 22–32)
Calcium: 8.6 mg/dL — ABNORMAL LOW (ref 8.9–10.3)
Calcium: 8 mg/dL — ABNORMAL LOW (ref 8.9–10.3)
Chloride: 107 mmol/L (ref 98–111)
Chloride: 109 mmol/L (ref 98–111)
Creatinine, Ser: 1.98 mg/dL — ABNORMAL HIGH (ref 0.61–1.24)
Creatinine, Ser: 1.98 mg/dL — ABNORMAL HIGH (ref 0.61–1.24)
GFR, Estimated: 35 mL/min — ABNORMAL LOW (ref >60)
GFR, Estimated: 35 mL/min — ABNORMAL LOW (ref >60)
Glucose, Bld: 162 mg/dL — ABNORMAL HIGH (ref 70–99)
Glucose, Bld: 108 mg/dL — ABNORMAL HIGH (ref 70–99)
Potassium: 3.9 mmol/L (ref 3.5–5.1)
Potassium: 4.1 mmol/L (ref 3.5–5.1)
Sodium: 139 mmol/L (ref 135–145)
Sodium: 139 mmol/L (ref 135–145)

## 2021-09-07 LAB — COOXEMETRY PANEL
Carboxyhemoglobin: 0.7 % (ref 0.5–1.5)
Methemoglobin: 1.3 % (ref 0.0–1.5)
O2 Saturation: 73.1 %
Total hemoglobin: 11 g/dL — ABNORMAL LOW (ref 12.0–16.0)

## 2021-09-07 LAB — GLUCOSE, CAPILLARY
Glucose-Capillary: 148 mg/dL — ABNORMAL HIGH (ref 70–99)
Glucose-Capillary: 166 mg/dL — ABNORMAL HIGH (ref 70–99)
Glucose-Capillary: 154 mg/dL — ABNORMAL HIGH (ref 70–99)
Glucose-Capillary: 160 mg/dL — ABNORMAL HIGH (ref 70–99)
Glucose-Capillary: 119 mg/dL — ABNORMAL HIGH (ref 70–99)
Glucose-Capillary: 125 mg/dL — ABNORMAL HIGH (ref 70–99)
Glucose-Capillary: 121 mg/dL — ABNORMAL HIGH (ref 70–99)
Glucose-Capillary: 104 mg/dL — ABNORMAL HIGH (ref 70–99)
Glucose-Capillary: 117 mg/dL — ABNORMAL HIGH (ref 70–99)
Glucose-Capillary: 101 mg/dL — ABNORMAL HIGH (ref 70–99)
Glucose-Capillary: 112 mg/dL — ABNORMAL HIGH (ref 70–99)
Glucose-Capillary: 121 mg/dL — ABNORMAL HIGH (ref 70–99)
Glucose-Capillary: 111 mg/dL — ABNORMAL HIGH (ref 70–99)
Glucose-Capillary: 104 mg/dL — ABNORMAL HIGH (ref 70–99)
Glucose-Capillary: 112 mg/dL — ABNORMAL HIGH (ref 70–99)
Glucose-Capillary: 101 mg/dL — ABNORMAL HIGH (ref 70–99)
Glucose-Capillary: 77 mg/dL (ref 70–99)
Glucose-Capillary: 104 mg/dL — ABNORMAL HIGH (ref 70–99)
Glucose-Capillary: 98 mg/dL (ref 70–99)
Glucose-Capillary: 117 mg/dL — ABNORMAL HIGH (ref 70–99)

## 2021-09-07 LAB — POCT I-STAT 7, (LYTES, BLD GAS, ICA,H+H)
Acid-base deficit: 4 mmol/L — ABNORMAL HIGH (ref 0.0–2.0)
Acid-base deficit: 1 mmol/L (ref 0.0–2.0)
Acid-base deficit: 1 mmol/L (ref 0.0–2.0)
Bicarbonate: 23.1 mmol/L (ref 20.0–28.0)
Bicarbonate: 25.9 mmol/L (ref 20.0–28.0)
Bicarbonate: 24.8 mmol/L (ref 20.0–28.0)
Calcium, Ion: 1.21 mmol/L (ref 1.15–1.40)
Calcium, Ion: 1.2 mmol/L (ref 1.15–1.40)
Calcium, Ion: 1.18 mmol/L (ref 1.15–1.40)
HCT: 38 % — ABNORMAL LOW (ref 39.0–52.0)
HCT: 35 % — ABNORMAL LOW (ref 39.0–52.0)
HCT: 34 % — ABNORMAL LOW (ref 39.0–52.0)
Hemoglobin: 12.9 g/dL — ABNORMAL LOW (ref 13.0–17.0)
Hemoglobin: 11.9 g/dL — ABNORMAL LOW (ref 13.0–17.0)
Hemoglobin: 11.6 g/dL — ABNORMAL LOW (ref 13.0–17.0)
O2 Saturation: 95 %
O2 Saturation: 95 %
O2 Saturation: 95 %
Patient temperature: 36.7
Patient temperature: 36.7
Patient temperature: 36.5
Potassium: 3.9 mmol/L (ref 3.5–5.1)
Potassium: 4 mmol/L (ref 3.5–5.1)
Potassium: 4 mmol/L (ref 3.5–5.1)
Sodium: 142 mmol/L (ref 135–145)
Sodium: 142 mmol/L (ref 135–145)
Sodium: 142 mmol/L (ref 135–145)
TCO2: 25 mmol/L (ref 22–32)
TCO2: 27 mmol/L (ref 22–32)
TCO2: 26 mmol/L (ref 22–32)
pCO2 arterial: 47.3 mmHg (ref 32.0–48.0)
pCO2 arterial: 49.6 mmHg — ABNORMAL HIGH (ref 32.0–48.0)
pCO2 arterial: 45.5 mmHg (ref 32.0–48.0)
pH, Arterial: 7.296 — ABNORMAL LOW (ref 7.350–7.450)
pH, Arterial: 7.324 — ABNORMAL LOW (ref 7.350–7.450)
pH, Arterial: 7.342 — ABNORMAL LOW (ref 7.350–7.450)
pO2, Arterial: 86 mmHg (ref 83.0–108.0)
pO2, Arterial: 84 mmHg (ref 83.0–108.0)
pO2, Arterial: 78 mmHg — ABNORMAL LOW (ref 83.0–108.0)

## 2021-09-07 LAB — CBC
HCT: 40.5 % (ref 39.0–52.0)
HCT: 36.1 % — ABNORMAL LOW (ref 39.0–52.0)
Hemoglobin: 12.9 g/dL — ABNORMAL LOW (ref 13.0–17.0)
Hemoglobin: 11.5 g/dL — ABNORMAL LOW (ref 13.0–17.0)
MCH: 29.8 pg (ref 26.0–34.0)
MCH: 29.7 pg (ref 26.0–34.0)
MCHC: 31.9 g/dL (ref 30.0–36.0)
MCHC: 31.9 g/dL (ref 30.0–36.0)
MCV: 93.5 fL (ref 80.0–100.0)
MCV: 93.3 fL (ref 80.0–100.0)
Platelets: 118 10*3/uL — ABNORMAL LOW (ref 150–400)
Platelets: 82 10*3/uL — ABNORMAL LOW (ref 150–400)
RBC: 4.33 MIL/uL (ref 4.22–5.81)
RBC: 3.87 MIL/uL — ABNORMAL LOW (ref 4.22–5.81)
RDW: 15.2 % (ref 11.5–15.5)
RDW: 15.5 % (ref 11.5–15.5)
WBC: 15.9 10*3/uL — ABNORMAL HIGH (ref 4.0–10.5)
WBC: 15 10*3/uL — ABNORMAL HIGH (ref 4.0–10.5)
nRBC: 0 % (ref 0.0–0.2)
nRBC: 0 % (ref 0.0–0.2)

## 2021-09-07 LAB — ECHO INTRAOPERATIVE TEE
AV Mean grad: 5.5 mmHg
AV Peak grad: 10.7 mmHg
Ao pk vel: 1.63 m/s
Height: 73 in
S' Lateral: 5.6 cm
Weight: 4800 oz

## 2021-09-07 LAB — MAGNESIUM
Magnesium: 3 mg/dL — ABNORMAL HIGH (ref 1.7–2.4)
Magnesium: 2.9 mg/dL — ABNORMAL HIGH (ref 1.7–2.4)

## 2021-09-07 MED ORDER — BUSPIRONE HCL 10 MG PO TABS: 15.0000 mg | ORAL_TABLET | Freq: Two times a day (BID) | ORAL | Status: DC

## 2021-09-07 MED ORDER — NOREPINEPHRINE 16 MG/250ML-% IV SOLN
0.0000 ug/min | INTRAVENOUS | Status: DC
Start: 2021-09-07 — End: 2021-09-20
  Administered 2021-09-07: 10 ug/min via INTRAVENOUS
  Administered 2021-09-08: 15 ug/min via INTRAVENOUS
  Administered 2021-09-08: 24 ug/min via INTRAVENOUS
  Administered 2021-09-09: 11 ug/min via INTRAVENOUS
  Administered 2021-09-10: 8 ug/min via INTRAVENOUS
  Administered 2021-09-12: 12 ug/min via INTRAVENOUS
  Administered 2021-09-13: 14 ug/min via INTRAVENOUS
  Administered 2021-09-16: 9 ug/min via INTRAVENOUS
  Administered 2021-09-16: 12 ug/min via INTRAVENOUS
  Filled 2021-09-07 (×13): qty 250

## 2021-09-07 MED ORDER — ORAL CARE MOUTH RINSE
15.0000 mL | OROMUCOSAL | Status: DC
  Administered 2021-09-07 – 2021-09-18 (×108): 15 mL via OROMUCOSAL

## 2021-09-07 MED ORDER — ALBUMIN HUMAN 5 % IV SOLN
250.0000 mL | INTRAVENOUS | Status: AC | PRN
  Administered 2021-09-07 (×4): 12.5 g via INTRAVENOUS
  Filled 2021-09-07 (×2): qty 250

## 2021-09-07 MED ORDER — REVEFENACIN 175 MCG/3ML IN SOLN
175.0000 ug | Freq: Every day | RESPIRATORY_TRACT | Status: DC
  Administered 2021-09-07 – 2021-09-22 (×16): 175 ug via RESPIRATORY_TRACT
  Filled 2021-09-07 (×18): qty 3

## 2021-09-07 MED ORDER — FENTANYL BOLUS VIA INFUSION
25.0000 ug | INTRAVENOUS | Status: DC | PRN
  Administered 2021-09-08 (×3): 100 ug via INTRAVENOUS
  Administered 2021-09-08 – 2021-09-09 (×2): 50 ug via INTRAVENOUS
  Administered 2021-09-13 – 2021-09-15 (×4): 25 ug via INTRAVENOUS
  Administered 2021-09-15: 100 ug via INTRAVENOUS
  Administered 2021-09-15 (×2): 25 ug via INTRAVENOUS
  Administered 2021-09-15 (×2): 100 ug via INTRAVENOUS
  Administered 2021-09-16: 50 ug via INTRAVENOUS
  Administered 2021-09-16 (×3): 100 ug via INTRAVENOUS
  Administered 2021-09-18 – 2021-09-19 (×2): 25 ug via INTRAVENOUS
  Filled 2021-09-07: qty 100

## 2021-09-07 MED ORDER — VANCOMYCIN HCL 1250 MG/250ML IV SOLN
1250.0000 mg | INTRAVENOUS | Status: DC
  Administered 2021-09-07 – 2021-09-11 (×5): 1250 mg via INTRAVENOUS
  Filled 2021-09-07 (×5): qty 250

## 2021-09-07 MED ORDER — FENTANYL CITRATE (PF) 100 MCG/2ML IJ SOLN
25.0000 ug | Freq: Once | INTRAMUSCULAR | Status: AC
Start: 2021-09-07 — End: 2021-09-13
  Administered 2021-09-13: 25 ug via INTRAVENOUS
  Filled 2021-09-07: qty 2

## 2021-09-07 MED ORDER — ARFORMOTEROL TARTRATE 15 MCG/2ML IN NEBU
15.0000 ug | INHALATION_SOLUTION | Freq: Two times a day (BID) | RESPIRATORY_TRACT | Status: DC
  Administered 2021-09-07 (×2): 15 ug via RESPIRATORY_TRACT
  Administered 2021-09-08: 18 ug via RESPIRATORY_TRACT
  Administered 2021-09-08 – 2021-09-22 (×28): 15 ug via RESPIRATORY_TRACT
  Filled 2021-09-07 (×32): qty 2

## 2021-09-07 MED ORDER — SODIUM BICARBONATE 8.4 % IV SOLN
50.0000 meq | Freq: Once | INTRAVENOUS | Status: AC
  Administered 2021-09-07: 50 meq via INTRAVENOUS
  Filled 2021-09-07: qty 50

## 2021-09-07 MED ORDER — BUSPIRONE HCL 10 MG PO TABS
15.0000 mg | ORAL_TABLET | Freq: Two times a day (BID) | ORAL | Status: DC
  Administered 2021-09-07 – 2021-09-22 (×30): 15 mg
  Filled 2021-09-07 (×29): qty 2

## 2021-09-07 MED ORDER — SODIUM CHLORIDE 0.9 % IV SOLN
2.0000 g | Freq: Two times a day (BID) | INTRAVENOUS | Status: DC
  Administered 2021-09-07 – 2021-09-10 (×7): 2 g via INTRAVENOUS
  Filled 2021-09-07 (×7): qty 2

## 2021-09-07 MED ORDER — CHLORHEXIDINE GLUCONATE 0.12% ORAL RINSE (MEDLINE KIT)
15.0000 mL | Freq: Two times a day (BID) | OROMUCOSAL | Status: DC
  Administered 2021-09-07 – 2021-09-22 (×29): 15 mL via OROMUCOSAL

## 2021-09-07 MED ORDER — BUSPIRONE HCL 10 MG PO TABS
15.0000 mg | ORAL_TABLET | Freq: Two times a day (BID) | ORAL | Status: DC
  Filled 2021-09-07: qty 2

## 2021-09-07 MED FILL — Heparin Sodium (Porcine) Inj 1000 Unit/ML: INTRAMUSCULAR | Qty: 30 | Status: AC

## 2021-09-07 MED FILL — Potassium Chloride Inj 2 mEq/ML: INTRAVENOUS | Qty: 40 | Status: AC

## 2021-09-07 MED FILL — Magnesium Sulfate Inj 50%: INTRAMUSCULAR | Qty: 10 | Status: AC

## 2021-09-07 MED FILL — Thrombin (Recombinant) For Soln 20000 Unit: CUTANEOUS | Qty: 1 | Status: AC

## 2021-09-07 NOTE — Progress Notes (Signed)
Pharmacy Antibiotic Note  [name] [name]. is a 70 y.o. male admitted on 09/02/2021 with AVR. Pt has hx MSSA bacteremia/discitis s/p cefazolin in 2020. 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 Pharmacy has been consulted for vancomycin and cefepime dosing. Cr is up to ~2 from baseline ~1.5 with long CPB time so defer vancomycin load.  Plan: Stop cefazolin Vancomycin '1250mg'$  q24h - est AUC 508 Cefepime 2g IV q12h  Height: '6\' 1"'$  (185.4 cm) Weight: 136.1 kg (300 lb) IBW/kg (Calculated) : 79.9  Temp (24hrs), Avg:97.5 F (36.4 C), Min:96.4 F (35.8 C), Max:98.2 F (36.8 C)  Recent Labs  Lab 09/04/21 1130 09/25/2021 0951 08/30/2021 1501 09/02/2021 1544 09/01/2021 1637 08/31/2021 1735 09/09/2021 2131 09/07/21 0406  WBC 6.9  --   --   --   --  20.8* 21.1* 15.9*  CREATININE 1.44*   < > 1.60* 1.50* 1.70*  --  1.87* 1.98*   < > = values in this interval not displayed.    Estimated Creatinine Clearance: 49.6 mL/min (A) (by C-G formula based on SCr of 1.98 mg/dL (H)).    Allergies  Allergen Reactions   Pseudoephedrine Other (See Comments)    Patient went into afib   Diltiazem Rash and Itching    Also 2019   Novocain [Procaine] Hives    Dentist appointment in 1958; since then has tolerated lidocaine and provocaine with no hives or difficulty.   Quinolones     Patient was warned about not using Cipro and similar antibiotics. Recent studies have raised concern that fluoroquinolone antibiotics could be associated with an increased risk of aortic aneurysm Fluoroquinolones have non-antimicrobial properties that might jeopardise the integrity of the extracellular matrix of the vascular wall In a  propensity score matched cohort study in [place], there was a 66% increased rate of aortic aneurysm or dissection associated with oral fluoroquinolone use, compared wit   Sulfonamide Derivatives Other (See Comments)    Childhood reaction     Antimicrobials this  admission: Vancomycin 9/9 >>  Cefepime 9/9 >>    Microbiology results: 9/8 Valve cx - pending   Thank you for allowing pharmacy to be a part of this patient's care.   Arrie Senate, PharmD, BCPS, [facility] Clinical Pharmacist [phone] Please check AMION for all [place] numbers 09/07/2021

## 2021-09-07 NOTE — Consult Note (Addendum)
NAME:  [name] Ortman., MRN:  [id number], DOB:  [birth year], LOS: 1 ADMISSION DATE:  09/08/2021, CONSULTATION DATE:  09/07/21 REFERRING MD:  Dr. [name], CHIEF COMPLAINT:  Resp failure   History of Present Illness:  HPI obtained from medical chart review as patient is intubated and sedated on mechanical ventilation.   70 year old male with prior history of prior tobacco abuse (quit 2010), centrilobular emphysema, chronic hypoxic respiratory failure on home O2 (?4L ), pulmonary hypertension, OSA on CPAP, PAF, CAD w/ LAD stent 2003, HLD, MSSA sepsis c/b lumbar discitis and infected left sternoclavicular joint 08/2019 who recently had worsening exertional dyspnea, orthopnea, and fatigue.  Had TTE in 04/2021 which showed new moderate to severe AI with normal EF 55-60 and new dilation of LV cavity with trivial MR and mild TR, normal RV.  Subsequently, underwent TEE on 07/10/21 which shoed EF 55-60%, mild dilation of ascending aorta, and severe AI.  Underwent [facility]  07/16/21 which showed patent LAD stent, mild diffuse nonobstructive CAD, moderate pulmonary hypertension with PA 67/20 (39) and a pulmonary wedge pressure of 37mHg.  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  Found to have AV vegetations which where sent for culture.  EBL during surgery 3025 ml with 1870 ml in blood products/ cell saver.  Patient did have some trouble transitioning off CPB with high PA pressures with struggling systemic pressures and RV, therefore ended up on NO and vasopressor support with milrinone.  He remains sedated and intubated with tenuous hemodynamics today.  PCCM consulted to help with vent management.  HF also being consulted to help with pulmonary hypertension.    Of note, patient is followed in our pulmonary office by Dr. SHalford Chessman last seen 08/10/21 by BDerl Barrow NP.  Previous CTA 6/22 neg for PE, noted underlying emphysematous changes with areas  of fibrosis and scarring, nodular opacity in RML (smaller now than previous imaging).  Reported compliance with CPAP and anoro.  PFT 08/09/21- FVC 3.42 (75%), FEV1 2. 60 (77%), ratio 76, DLCOunc 10/29 (38%).  Uses O2 2L at rest, and 3-4L with exertions for goal sats > 88-90%.  Pertinent  Medical History  Former Tobacco abuse (quit 2010), centrilobular emphysema, pulmonary hypertension, OSA on CPAP, PAF, CAD w/ LAD stent 2003, HLD, MSSA sepsis c/b lumbar discitis and infected left sternoclavicular joint 08/2019   Significant Hospital Events: Including procedures, antibiotic start and stop dates in addition to other pertinent events   9/8 aortic valve replacement with bioprosthetic valve and biatrial Maze procedure  9/8 ETT >> 9/8 foley >> 9/8 R IJ cordis with PA cath >> 9/8 L radial Aline >> 9/8 L femoral aline (placed in OR given variable L radial tracings)>> 9/8 parasternal pacing wires >> 9/8 mediastinal CT x 2 >>  9/8 aortic tissue cx >> 9/8 aortic tissue AFB >> 9/8 aortic fungal >> 9/9 Bcx 2 >>  9/8 ancef >>9/9 9/8 vanc >> 9/9 cefepime >>  Interim History / Subjective:  Currently on NE 25, vaso, Epi 10, milrinone 0.375 NO 30 ppm Tenuous BP at times, dropped earlier, better now after albumin x 3 CI 3.75, CO 9.59, PA 51/37, CVP 10- 16 Afebrile UOP 965 ml/24hr Net +4L  Objective   Blood pressure 120/62, pulse 90, temperature 98.1 F (36.7 C), resp. rate 20, height _0  (1.854 m), weight 136.1 kg, SpO2 93 %. PAP: (35-70)/(24-35) 39/24 CO:  [6.2 L/min-9.1 L/min] 7.6 L/min CI:  [  2.4 L/min/m2-3.6 L/min/m2] 3 L/min/m2  Vent Mode: SIMV;PSV;PRVC FiO2 (%):  [80 %-100 %] 80 % Set Rate:  [20 bmp] 20 bmp Vt Set:  [630 mL] 630 mL PEEP:  [5 [name]20-10 [name]20] 10 [name]20 Pressure Support:  [10 [name]20] 10 [name]20 Plateau Pressure:  [20 [name]20-23 [name]20] 23 [name]20   Intake/Output Summary (Last 24 hours) at 09/07/2021 0817 Last data filed at 09/07/2021 0700 Gross per 24 hour  Intake 8488.26 ml   Output 4445 ml  Net 4043.26 ml   Filed Weights   09/05/2021 0801  Weight: 136.1 kg    Examination: General:  critically ill adult male intubated/ sedated on mv HEENT: MM pink/moist, ETT 7.5 at 24 at lip, OGT, pupils 4/reactive Neuro:  opens eyes to verbal, follows simple commands in all extremities  CV: currently AV paced in DDD via sternal wires, mediastinal CT x 2 PULM:  non labored, on SIMV+PS changed to PRVC, clear GI: obese, soft, bs+, foley Extremities: warm/dry, 2-3+ LE edema, edema of RUE (swollen prior to surgery reportedly), LUE wnl Skin: no rashes   Resolved Hospital Problem list    Assessment & Plan:   Acute on chronic hypoxic respiratory failure (on home O2 at home) OSA, on home CPAP Emphysema/ COPD Former tobacco abuse  - change from SIMV +PS to [facility], f/b repeat ABG in 1 hr - goal Pplat <30 and DP<15  - VAP prevention protocol/ PPI - PAD protocol for sedation> fentanyl gtt/ precedex, RASS goal -2/-3 w/bowel regimen, prn versed  - wean FiO2 as able for SpO2 >88-94 - daily SAT & SBT, does not meet criteria today  - interval CXR/ ABG - change anoro to brovanna/ yupleri - cont xopenex prn    AV vegetations suggestive of endocarditis -  ? More chronic given predominately mononuclear, moderate WBC but no organisms on gram stain.   - given shock state, will sent blood cultures and broaden abx with cefepime/ vanc.  De-esclate as able based on cultures - follow cultures sent in OR of vegetation tissue  - trend WBC/ fever curve    Pulmonary hypertension with decompensated RV failure - PA pressures unchanged despite AVR - this morning have been low with variable BP, better after albumin - HF to be consulted for further management - continue NO   AI s/p AVR and MAZE procedure Hx PAF, CAD with LAD stent Shock- possibly multifactorial  - TCTS remains primary  - pacing per TCTS, currently in DDD, did not tolerate/ capture well on AAI  - mediastinal CT per TCTS,  no significant output - Hgb remains stable  - continue NE, epi, vaso, and milrinone for hemodynamic support/ MAP goal > 65 - goal CVP > 10 given he is preload dependent - serial hemodynamics.  CO/ CI reassuring.  At this time, do not have a SVR to rule out distributive component.  - goal Mag >2, K> 4 - ASA, lipitor per TCTS   Acute on chronic CKD stage 3, likely r/t hypoperfusion - continue supportive care, MAP >65 - uop remains stable 30-60 ml/hr - continue foley/ strict I/Os - serial renal indices   - Replace electrolytes as indicated - Avoid nephrotoxic agents, ensure adequate renal perfusion   DM - insulin gtt per protocol   Morbid obesity, BMI 39.5 kg/m2  Best Practice (right click and "Reselect all SmartList Selections" daily)   Diet/type: NPO; consider starting TF 9/10 DVT prophylaxis: SCD, defer to TCTS GI prophylaxis: PPI Lines: yes and it is still needed Foley:  Yes, and it is still needed Code Status:  full code Last date of multidisciplinary goals of care discussion [per primary, no family at bedside 9/9 am. ]  Labs   CBC: Recent Labs  Lab 09/04/21 1130 09/26/2021 0951 09/23/2021 1319 09/05/2021 1351 09/04/2021 1735 08/30/2021 1916 09/26/2021 2106 09/13/2021 2131 09/07/21 0403 09/07/21 0406  WBC 6.9  --   --   --  20.8*  --   --  21.1*  --  15.9*  HGB 12.9*   < > 10.4*  10.9*   < > 13.4 13.9 13.6 13.3 12.9* 12.9*  HCT 40.2   < > 31.7*  32.0*   < > 42.1 41.0 40.0 42.2 38.0* 40.5  MCV 92.8  --   --   --  94.4  --   --  93.8  --  93.5  PLT 157  --  150  --  140*  --   --  135*  --  118*   < > = values in this interval not displayed.    Basic Metabolic Panel: Recent Labs  Lab 09/04/21 1130 09/13/2021 0951 09/17/2021 1501 09/18/2021 1544 09/02/2021 1552 08/31/2021 1637 09/26/2021 1734 09/02/2021 1916 09/04/2021 2106 09/26/2021 2131 09/07/21 0403 09/07/21 0406  NA 137   < > 141 142   < > 145   < > 144 144 140 142 139  K 4.6   < > 5.2* 4.7   < > 4.3   < > 4.0 3.7 3.7 3.9  3.9  CL 107   < > 106 108  --  108  --   --   --  108  --  107  CO2 20*  --   --   --   --   --   --   --   --  23  --  24  GLUCOSE 126*   < > 167* 160*  --  147*  --   --   --  208*  --  162*  BUN 28*   < > 36* 33*  --  32*  --   --   --  31*  --  33*  CREATININE 1.44*   < > 1.60* 1.50*  --  1.70*  --   --   --  1.87*  --  1.98*  CALCIUM 9.2  --   --   --   --   --   --   --   --  8.4*  --  8.6*  MG  --   --   --   --   --   --   --   --   --  3.0*  --  3.0*   < > = values in this interval not displayed.   GFR: Estimated Creatinine Clearance: 49.6 mL/min (A) (by C-G formula based on SCr of 1.98 mg/dL (H)). Recent Labs  Lab 09/04/21 1130 09/08/2021 1735 09/27/2021 2131 09/07/21 0406  WBC 6.9 20.8* 21.1* 15.9*    Liver Function Tests: Recent Labs  Lab 09/04/21 1130  AST 26  ALT 32  ALKPHOS 50  BILITOT 1.5*  PROT 6.4*  ALBUMIN 3.8   No results for input(s): LIPASE, AMYLASE in the last 168 hours. No results for input(s): AMMONIA in the last 168 hours.  ABG    Component Value Date/Time   PHART 7.296 (L) 09/07/2021 0403   PCO2ART 47.3 09/07/2021 0403   PO2ART 86 09/07/2021 0403   HCO3 23.1 09/07/2021 0403  TCO2 25 09/07/2021 0403   ACIDBASEDEF 4.0 (H) 09/07/2021 0403   O2SAT 95.0 09/07/2021 0403     Coagulation Profile: Recent Labs  Lab 09/04/21 1130 09/18/2021 1735  INR 1.2 1.6*    Cardiac Enzymes: No results for input(s): CKTOTAL, CKMB, CKMBINDEX, TROPONINI in the last 168 hours.  HbA1C: Hgb A1c MFr Bld  Date/Time Value Ref Range Status  09/04/2021 11:30 AM 6.5 (H) 4.8 - 5.6 % Final    Comment:    (NOTE) Pre diabetes:          5.7%-6.4%  Diabetes:              >6.4%  Glycemic control for   <7.0% adults with diabetes   07/13/2021 08:01 AM 6.7 (H) 4.6 - 6.5 % Final    Comment:    Glycemic Control Guidelines for People with Diabetes:Non Diabetic:  <6%Goal of Therapy: <7%Additional Action Suggested:  >8%     CBG: Recent Labs  Lab 09/07/21 0305  09/07/21 0400 09/07/21 0524 09/07/21 0639 09/07/21 0746  GLUCAP 154* 160* 119* 125* 121*    Review of Systems:   Unable as patient is sedated/ intubated on mechanical ventilation  Past Medical History:  He,  has a past medical history of Anxiety, Atrial fibrillation ([place]) (03/22/2009), Coronary atherosclerosis of native coronary artery (11/2002), Cutaneous abscess of back excluding buttocks (07/04/2014), Diabetes mellitus without complication ([place]), Drusen body, Dyspnea, Epiglottitis, ERECTILE DYSFUNCTION (03/22/2009), GERD (03/22/2009), Heart murmur, HYPERGLYCEMIA (04/25/2010), HYPERLIPIDEMIA (03/22/2009), Hypertension, 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   Surgical History:   Past Surgical History:  Procedure Laterality Date   APPENDECTOMY N/A 06/06/2014   Procedure: APPENDECTOMY;  Surgeon: Odis Hollingshead, MD;  Location: [facility];  Service: General;  Laterality: N/A;   COLON SURGERY     COLONOSCOPY  11/15/2011   Procedure: COLONOSCOPY;  Surgeon: Juanita Craver, MD;  Location: [facility] ENDOSCOPY;  Service: Endoscopy;  Laterality: N/A;   COLONOSCOPY WITH PROPOFOL N/A 01/03/2017   Procedure: COLONOSCOPY WITH PROPOFOL;  Surgeon: Carol Ada, MD;  Location: [facility];  Service: Endoscopy;  Laterality: N/A;   COLONOSCOPY WITH PROPOFOL N/A 05/26/2020   Procedure: COLONOSCOPY WITH PROPOFOL;  Surgeon: Carol Ada, MD;  Location: [facility] END[facility];  Service: Endoscopy;  Laterality: N/A;   cyst on epiglottis  08/2002    ESOPHAGOGASTRODUODENOSCOPY  11/15/2011   Procedure: ESOPHAGOGASTRODUODENOSCOPY (EGD);  Surgeon: Juanita Craver, MD;  Location: [facility];  Service: Endoscopy;  Laterality: N/A;   LAPAROSCOPIC APPENDECTOMY N/A 06/06/2014   Procedure: APPENDECTOMY LAPAROSCOPIC attemted;  Surgeon: Odis Hollingshead, MD;  Location: [facility];  Service: General;  Laterality: N/A;   LUMBAR DISC SURGERY     two  holes in spinalcovering   POLYPECTOMY  05/26/2020   Procedure: POLYPECTOMY;  Surgeon: Carol Ada, MD;  Location: [facility];  Service: Endoscopy;;   RIGHT/LEFT HEART CATH AND CORONARY ANGIOGRAPHY N/A 07/16/2021   Procedure: RIGHT/LEFT HEART CATH AND CORONARY ANGIOGRAPHY;  Surgeon: Jettie Booze, MD;  Location: [place] [facility];  Service: Cardiovascular;  Laterality: N/A;   stent     LAD DUS 2004   STERNAL WOUND DEBRIDEMENT Left 08/31/2019   Procedure: LEFT STERNOCLAVICULAR WOUND DEBRIDEMENT WITH APPLICATION OF WOUND VAC;  Surgeon: Grace Isaac, MD;  Location[facility] OR;  Service: Thoracic;  Laterality: Left;   surgery l4-l5   1998   ruptured x 3   TEE WITHOUT CARDIOVERSION N/A 07/10/2021   Procedure: TRANSESOPHAGEAL ECHOCARDIOGRAM (TEE);  Surgeon: Lelon Perla, MD;  Location: [facility] ENDOSCOPY;  Service: Cardiovascular;  Laterality: N/A;   ulnar neuropathy     UMBILICAL HERNIA REPAIR     mesh     Social History:   reports that he quit smoking about 12 years ago. His smoking use included cigarettes. He has a 84.00 pack-year smoking history. He has never used smokeless tobacco. He reports that he does not drink alcohol and does not use drugs.   Family History:  His family history includes Breast cancer in his mother; Cancer in his father; Lung cancer in his father; Ovarian cancer in his mother; Thyroid disease in his mother.   Allergies Allergies  Allergen Reactions   Pseudoephedrine Other (See Comments)    Patient went into afib   Diltiazem Rash and Itching    Also 2019   Novocain  [Procaine] Hives    Dentist appointment in 1958; since then has tolerated lidocaine and provocaine with no hives or difficulty.   Quinolones     Patient was warned about not using Cipro and similar antibiotics. Recent studies have raised concern that fluoroquinolone antibiotics could be associated with an increased risk of aortic aneurysm Fluoroquinolones have non-antimicrobial properties that might jeopardise the integrity of the extracellular matrix of the vascular wall In a  propensity score matched cohort study in [place], there was a 66% increased rate of aortic aneurysm or dissection associated with oral fluoroquinolone use, compared wit   Sulfonamide Derivatives Other (See Comments)    Childhood reaction      Home Medications  Prior to Admission medications   Medication Sig Start Date End Date Taking? Authorizing Provider  acetaminophen (TYLENOL) 325 MG tablet Take 650 mg by mouth every 6 (six) hours as needed for mild pain.   Yes [provider]  ALPRAZolam (XANAX) 0.5 MG tablet Take 1 tablet (0.5 mg total) by mouth 2 (two) times daily as needed for anxiety. 08/31/21  Yes Elwanda Brooklyn, NP  apixaban (ELIQUIS) 5 MG TABS tablet Take 1 tablet (5 mg total) by mouth 2 (two) times daily. 07/17/21  Yes Jettie Booze, MD  atorvastatin (LIPITOR) 80 MG tablet Take 1 tablet (80 mg total) by mouth daily. 07/16/21  Yes Jettie Booze, MD  brimonidine (ALPHAGAN) 0.15 % ophthalmic solution Place 1 drop into both eyes 3 (three) times daily. 07/04/20  Yes [provider]  busPIRone (BUSPAR) 15 MG tablet Take one tablet twice daily. 08/31/21  Yes White, Aaron Edelman A, NP  Cholecalciferol (VITAMIN D) 50 MCG (2000 UT) tablet Take 2,000-4,000 Units by mouth See admin instructions. Take 2000 units by mouth in the morning and 4000 units in the evening   Yes [provider]  diphenhydrAMINE (BENADRYL) 25 MG tablet Take 25-50 mg by mouth at bedtime as needed for sleep.    Yes [provider]  dofetilide (TIKOSYN) 500 MCG capsule Take 1 capsule (500 mcg total) by mouth 2 (two) times daily. 07/16/21  Yes Jettie Booze, MD  famotidine (PEPCID) 10 MG tablet Take 10 mg by mouth daily as needed for heartburn.   Yes [provider]  fexofenadine (ALLEGRA) 180 MG tablet Take 180 mg by mouth daily as needed for allergies.    Yes [provider]  fish oil-omega-3 fatty acids 1000 MG capsule Take 1 g by mouth 2 (two) times daily.   Yes [provider]  fluticasone (FLONASE) 50 MCG/ACT nasal spray Place 2 sprays into both nostrils daily. 07/16/21  Yes Jettie Booze, MD  hydrALAZINE (APRESOLINE) 100 MG tablet Take 1 tablet (100 mg total) by mouth 3 (three) times daily. 06/18/21 09/16/21 Yes Lelon Perla, MD  losartan (COZAAR) 100 MG tablet Take 1 tablet (100 mg total) by mouth daily. 11/15/20  Yes Lelon Perla, MD  Magnesium 250 MG TABS Take 250-500 mg by mouth See admin instructions. Takes 250 mg by mouth in the morning and 500 mg in the evening   Yes [provider]  metFORMIN (GLUCOPHAGE-XR) 500 MG 24 hr tablet Take 1 tablet (500 mg total) by mouth in the morning, at noon, and at bedtime. 07/16/21  Yes Jettie Booze, MD  MULTIPLE VITAMIN PO Take 1 tablet by mouth every evening.   Yes [provider]  PARoxetine (PAXIL) 20 MG tablet TAKE 2&1/2 TABLETS BY MOUTH DAILY Patient taking differently: Take 60 mg by mouth daily. 07/16/21  Yes Panosh, Standley Brooking, MD  potassium chloride SA (KLOR-CON M20) 20 MEQ tablet Take 1 tablet (20 mEq total) by mouth daily. 07/16/21  Yes Jettie Booze, MD  traZODone (DESYREL) 50 MG tablet TAKE 1 TABLET BY MOUTH EVERYDAY AT BEDTIME Patient taking differently: Take 50 mg by mouth at bedtime. 07/08/21  Yes Panosh, Standley Brooking, MD  triamcinolone cream (KENALOG) 0.1 % Apply 1 application topically 2 (two) times daily as needed (irritation). 07/16/21  Yes Jettie Booze, MD   umeclidinium-vilanterol [facility] ELLIPTA) 62.5-25 MCG/INH AEPB Inhale 1 puff into the lungs daily. 07/16/21  Yes Jettie Booze, MD  albuterol (PROVENTIL HFA;VENTOLIN HFA) 108 (90 Base) MCG/ACT inhaler Inhale 2 puffs into the lungs every 6 (six) hours as needed for wheezing or shortness of breath. 03/24/17   Panosh, Standley Brooking, MD  Blood Glucose Monitoring Suppl (ONETOUCH VERIO) w/Device KIT Use to test blood sugars 1-2 times daily. 12/04/20   Panosh, Standley Brooking, MD  busPIRone (BUSPAR) 10 MG tablet Take 1 tablet (10 mg total) by mouth 2 (two) times daily. 06/07/21   Elwanda Brooklyn, NP  COVID-19 mRNA Vac-TriS, Pfizer, (PFIZER-BIONT COVID-19 VAC-TRIS) SUSP injection Inject into the muscle. 07/26/21   Carlyle Basques, MD  Lancets [name] ULTRASOFT) lancets Twice daily 12/01/19   Panosh, Standley Brooking, MD  methocarbamol (ROBAXIN) 500 MG tablet TAKE 1 TABLET BY MOUTH 4 TIMES A DAY AS NEEDED FOR MUSCLE SPASM Patient taking differently: Take 500 mg by mouth 4 (four) times daily as needed for muscle spasms. 01/26/20   Lovorn, [name], MD  nitroGLYCERIN (NITROSTAT) 0.4 MG SL tablet Place 1 tablet (0.4 mg total) under the tongue every 5 (five) minutes as needed for chest pain. 02/01/19   Lelon Perla, MD  [facility] VERIO test strip USE AS DIRECTED TWICE A DAY 12/11/20   Panosh, Standley Brooking, MD  pantoprazole (PROTONIX) 40 MG tablet Take 40 mg by mouth daily as needed (acid reflux). 08/05/19   [provider]  tamsulosin (FLOMAX) 0.4 MG CAPS capsule Take 1 capsule (0.4 mg total) by mouth daily as needed (repeat kidney stone). 07/16/21   Jettie Booze, MD     Critical care time: 75 mins     Kennieth Rad, ACNP [facility] 09/07/2021, 8:17 AM  See Amion for pager If no response to pager, please call PCCM consult  pager After 7:00 pm call Elink

## 2021-09-07 NOTE — Progress Notes (Signed)
Patient ID: [name] [name]., male   DOB: 28-Jul-1951, 70 y.o.   MRN: [id number] TCTS Evening Rounds:  Hemodynamics stable today.  BP 140/60 now with PA 52/26, CVP 11 CO 8 with Co-ox 73%  NE weaned from 25 to 10 Epi 8, milrinone 0.375, vaso 0.03  Making urine 60/hr with creat stable at 1.98 tonight.  Has done well on vent today.   Try to maintain stability overnight with MAP >70, wean vaso slowly as BP allows. Continue epi and milrinone at present dose for RV support.  Operative cultures negative so far. Antibiotic coverage broadened in case this is active endocarditis.

## 2021-09-07 NOTE — Progress Notes (Signed)
Nutrition Follow-up  DOCUMENTATION CODES:   Not applicable  INTERVENTION:   If unable to extubate within 24-48 hours, recommend initiation of TF once hemodynamically stable  Tube Feeding via OG:  Vital 1.5 at 50 ml/hr Pro-Source TF 90 mL TID Provides 147 g of protein, 2040 kcals, 912 mL of free water   NUTRITION DIAGNOSIS:   Inadequate oral intake related to acute illness as evidenced by NPO status.  GOAL:   Patient will meet greater than or equal to 90% of their needs   MONITOR:   Vent status, TF tolerance, Labs, Weight trends  REASON FOR ASSESSMENT:   Ventilator    ASSESSMENT:   70 yo male admitted with acute on chronic respiratory failure requiring vent support, AV vegetations suggestive of endocarditis, AKI on CKD. PMH includes CKD III, DM, COPD, CHF, HTN  9/08 Sternotomy, AVR  Pt remains on vent, hemodynamically unstable this AM. Currently requiring Epi, vaso, levo, milrinone  OG with 750 mL out  Weight 136 kg; net +5 L. Unsure of dry weight  Son at bedside and reports pt has been eating well prior to admission with no recent changes in weight  Labs: Creatinine 1.98, BUN 35 Meds: reviewed   Diet Order:   Diet Order             Diet NPO time specified  Diet effective now                   EDUCATION NEEDS:   Not appropriate for education at this time  Skin:  Skin Assessment: Reviewed RN Assessment  Last BM:  prior to admission  Height:   Ht Readings from Last 1 Encounters:  09/18/2021 '6\' 1"'$  (1.854 m)    Weight:   Wt Readings from Last 1 Encounters:  08/31/2021 136.1 kg    BMI:  Body mass index is 39.58 kg/m.  Estimated Nutritional Needs:   Kcal:  2000-2200 kcals  Protein:  135-160 g  Fluid:  >/= 2 L  Kerman Passey MS, RDN, LDN, CNSC Registered Dietitian III Clinical Nutrition RD Pager and On-Call Pager Number Located in [place]

## 2021-09-07 NOTE — Progress Notes (Signed)
1 Day Post-Op Procedure(s) (LRB): AORTIC VALVE REPLACEMENT (AVR) USING INSPIRIS AORTIC VALVE 27 MM (N/A) MAZE (N/A) TRANSESOPHAGEAL ECHOCARDIOGRAM (TEE) (N/A) APPLICATION OF CELL SAVER CLIPPING OF ATRIAL APPENDAGE USING ATRICLIP PRO 240 Subjective: Intubated and sedated on vent  Has been relatively stable overnight on milrinone 0.375, epi 10, NE 22, vaso 0.03, NO 30 ppm.  CI 2.98 PA 36/25    Objective: Vital signs in last 24 hours: Temp:  [96.4 F (35.8 C)-98.4 F (36.9 C)] 98.2 F (36.8 C) (09/09 0600) Pulse Rate:  [67-91] 90 (09/09 0400) Resp:  [12-22] 20 (09/09 0600) BP: (84-145)/(46-79) 105/65 (09/09 0600) SpO2:  [89 %-95 %] 94 % (09/09 0600) Arterial Line BP: (87-142)/(41-63) 115/46 (09/09 0600) FiO2 (%):  [80 %-100 %] 80 % (09/09 0400) Weight:  [136.1 kg] 136.1 kg (09/08 0801)  Hemodynamic parameters for last 24 hours: PAP: (40-70)/(24-35) 45/26 CO:  [6.2 L/min-9.1 L/min] 7.6 L/min CI:  [2.4 L/min/m2-3.6 L/min/m2] 3 L/min/m2  Intake/Output from previous day: 09/08 0701 - 09/09 0700 In: 8233.2 [I.V.:5030.8; Blood:1870; IV Piggyback:1302.5] Out: [id number] [Urine:965; Emesis/NG output:100; Blood:3025; Chest Tube:355] Intake/Output this shift: Total I/O In: 2922.6 [I.V.:2458.4; Other:30; IV Piggyback:434.1] Out: 685 [Urine:380; Emesis/NG output:100; Chest Tube:205]  General appearance: intubated and sedated on vent Neurologic: moving all ext and reaching for tube per nurse Heart: regular rate and rhythm, S1, S2 normal, no murmur Lungs: clear to auscultation bilaterally Abdomen: soft, non-tender; bowel sounds hypoactive Extremities: edema moderate Wound: dressing dry  Lab Results: Recent Labs    09/13/2021 2131 09/07/21 0403 09/07/21 0406  WBC 21.1*  --  15.9*  HGB 13.3 12.9* 12.9*  HCT 42.2 38.0* 40.5  PLT 135*  --  118*   BMET:  Recent Labs    09/11/2021 2131 09/07/21 0403 09/07/21 0406  NA 140 142 139  K 3.7 3.9 3.9  CL 108  --  107  CO2 23  --  24   GLUCOSE 208*  --  162*  BUN 31*  --  33*  CREATININE 1.87*  --  1.98*  CALCIUM 8.4*  --  8.6*    PT/INR:  Recent Labs    09/03/2021 1735  LABPROT 18.6*  INR 1.6*   ABG    Component Value Date/Time   PHART 7.296 (L) 09/07/2021 0403   HCO3 23.1 09/07/2021 0403   TCO2 25 09/07/2021 0403   ACIDBASEDEF 4.0 (H) 09/07/2021 0403   O2SAT 95.0 09/07/2021 0403   CBG (last 3)  Recent Labs    09/07/21 0400 09/07/21 0524 09/07/21 0639  GLUCAP 160* 119* 125*   CXR: pulmonary vascular congestion  Assessment/Plan: S/P Procedure(s) (LRB): AORTIC VALVE REPLACEMENT (AVR) USING INSPIRIS AORTIC VALVE 27 MM (N/A) MAZE (N/A) TRANSESOPHAGEAL ECHOCARDIOGRAM (TEE) (N/A) APPLICATION OF CELL SAVER CLIPPING OF ATRIAL APPENDAGE USING ATRICLIP PRO 240  POD 1  He is hemodynamically unstable this am and current vasopressors and inotropes with good CO. PA pressure down to 35/23 which I think is very low for him. He was 60/30 postop. Will give some volume. He has severe fixed pulmonary hypertension. Will ask heart failure team for help with management of this.   Underlying pulmonary disease with long smoking hx, resting hypoxemia preop and cobblestone appearance of lungs in OR. PFT's showed mild obstructive defect and severe diffusion defect at 38% of predicted. He had PA pressure of 90/45 preop. PA pressure did not decrease much with correction of his severe AI. Will consult CCM for help with vent management.   Stage 3 CKD with baseline  creat about 1.5 with postop acute kidney injury due to hypotension.  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 Operative GS showed WBC but no organisms. Culture pending. Continue periop antibiotic for now.  Morbid obesity.  DM: glucose under adequate control on insulin drip.   LOS: 1 day    [name] [name] 09/07/2021

## 2021-09-07 NOTE — Progress Notes (Signed)
[address]       [place] [address]             [phone]      1 Day Post-Op  Procedure(s) (LRB): AORTIC VALVE REPLACEMENT (AVR) USING INSPIRIS AORTIC VALVE 27 MM (N/A) MAZE (N/A) TRANSESOPHAGEAL ECHOCARDIOGRAM (TEE) (N/A) APPLICATION OF CELL SAVER CLIPPING OF ATRIAL APPENDAGE USING ATRICLIP PRO 240   Total Length of Stay:  LOS: 1 day    SUBJECTIVE:  Vitals:   09/07/21 1615 09/07/21 1630  BP:    Pulse:    Resp: (!) 22 (!) 22  Temp: (!) 97 F (36.1 C) (!) 97 F (36.1 C)  SpO2: 95% 94%    Intake/Output      09/08 0701 09/09 0700 09/09 0701 09/10 0700   I.V. (mL/kg) 5245.8 (38.5) 1332.3 (9.8)   Blood 1870    Other 30    IV Piggyback 1342.5 938.5   Total Intake(mL/kg) 8488.3 (62.4) 2270.8 (16.7)   Urine (mL/kg/hr) 965 575 (0.4)   Emesis/NG output 100 100   Blood 3025    Chest Tube 355 180   Total Output 4445 855   Net +4043.3 +1415.8            sodium chloride 20 mL/hr at 09/07/21 1600   sodium chloride     sodium chloride     albumin human 20 mL/hr at 09/07/21 1600   ceFEPime (MAXIPIME) IV Stopped (09/07/21 1308)   dexmedetomidine (PRECEDEX) IV infusion 0.7 mcg/kg/hr (09/07/21 1600)   epinephrine 10 mcg/min (09/07/21 1600)   fentaNYL infusion INTRAVENOUS 150 mcg/hr (09/07/21 1600)   insulin 2.4 Units/hr (09/07/21 1600)   lactated ringers     lactated ringers 20 mL/hr at 09/07/21 1600   midazolam     milrinone 0.375 mcg/kg/min (09/07/21 1600)   nitroGLYCERIN     norepinephrine (LEVOPHED) Adult infusion 11 mcg/min (09/07/21 1600)   vancomycin Stopped (09/07/21 1217)   vasopressin 0.03 Units/min (09/07/21 1600)    CBC    Component Value Date/Time   WBC 15.9 (H) 09/07/2021 0406   RBC 4.33 09/07/2021 0406   HGB 11.6 (L) 09/07/2021 1148   HGB 13.7 07/12/2021 0823   HGB 16.3 03/12/2011 0948   HCT 34.0 (L) 09/07/2021 1148   HCT 42.5 07/12/2021 0823   HCT 46.6 03/12/2011 0948   PLT 118 (L) 09/07/2021 0406   PLT 179  07/12/2021 0823   MCV 93.5 09/07/2021 0406   MCV 90 07/12/2021 0823   MCV 90.9 03/12/2011 0948   MCH 29.8 09/07/2021 0406   MCHC 31.9 09/07/2021 0406   RDW 15.2 09/07/2021 0406   RDW 13.1 07/12/2021 0823   RDW 13.1 03/12/2011 0948   LYMPHSABS 1.6 07/13/2021 0801   LYMPHSABS 3.4 (H) 12/25/2017 0937   LYMPHSABS 2.7 03/12/2011 0948   MONOABS 0.6 07/13/2021 0801   MONOABS 0.6 03/12/2011 0948   EOSABS 0.4 07/13/2021 0801   EOSABS 0.6 (H) 12/25/2017 0937   BASOSABS 0.0 07/13/2021 0801   BASOSABS 0.0 12/25/2017 0937   BASOSABS 0.0 03/12/2011 0948   CMP     Component Value Date/Time   NA 142 09/07/2021 1148   NA 138 07/19/2021 1407   K 4.0 09/07/2021 1148   CL 107 09/07/2021 0406   CO2 24 09/07/2021 0406   GLUCOSE 162 (H) 09/07/2021 0406   BUN 33 (H) 09/07/2021 0406   BUN 30 (H) 07/19/2021 1407   CREATININE 1.98 (H) 09/07/2021 0406   CREATININE 1.21 (H) 09/14/2020  0754   CALCIUM 8.6 (L) 09/07/2021 0406   PROT 6.4 (L) 09/04/2021 1130   PROT 7.0 04/30/2021 1013   ALBUMIN 3.8 09/04/2021 1130   ALBUMIN 4.5 04/30/2021 1013   AST 26 09/04/2021 1130   ALT 32 09/04/2021 1130   ALKPHOS 50 09/04/2021 1130   BILITOT 1.5 (H) 09/04/2021 1130   BILITOT 1.0 04/30/2021 1013   GFRNONAA 35 (L) 09/07/2021 0406   GFRNONAA 60 09/14/2020 0754   GFRAA 64 12/04/2020 1444   GFRAA 70 09/14/2020 0754   ABG    Component Value Date/Time   PHART 7.342 (L) 09/07/2021 1148   PCO2ART 45.5 09/07/2021 1148   PO2ART 78 (L) 09/07/2021 1148   HCO3 24.8 09/07/2021 1148   TCO2 26 09/07/2021 1148   ACIDBASEDEF 1.0 09/07/2021 1148   O2SAT 95.0 09/07/2021 1148   CBG (last 3)  Recent Labs    09/07/21 1344 09/07/21 1443 09/07/21 1641  GLUCAP 121* 111* 104*     ASSESSMENT:  Stable Day, remains critically ill  Sedated on Vent- CCM is on board and managing, they have also started empiric ABX coverage  Remains on Epi at 10, Milrinone at 0.375, Levophed at 10, and Vaso at 0.3  Stable H/H. CT  output remains low, U/O okay, repeat labs pending for this evening  Ellwood Handler, PA-C 09/07/2021 4:47 PM

## 2021-09-08 ENCOUNTER — Inpatient Hospital Stay (HOSPITAL_COMMUNITY): Payer: Medicare Other

## 2021-09-08 DIAGNOSIS — I272 Pulmonary hypertension, unspecified: Secondary | ICD-10-CM | POA: Diagnosis not present

## 2021-09-08 DIAGNOSIS — Z953 Presence of xenogenic heart valve: Secondary | ICD-10-CM | POA: Diagnosis not present

## 2021-09-08 DIAGNOSIS — I50811 Acute right heart failure: Secondary | ICD-10-CM | POA: Diagnosis not present

## 2021-09-08 DIAGNOSIS — J9621 Acute and chronic respiratory failure with hypoxia: Secondary | ICD-10-CM | POA: Diagnosis not present

## 2021-09-08 LAB — POCT I-STAT 7, (LYTES, BLD GAS, ICA,H+H)
Acid-base deficit: 4 mmol/L — ABNORMAL HIGH (ref 0.0–2.0)
Bicarbonate: 22.9 mmol/L (ref 20.0–28.0)
Calcium, Ion: 1.23 mmol/L (ref 1.15–1.40)
HCT: 35 % — ABNORMAL LOW (ref 39.0–52.0)
Hemoglobin: 11.9 g/dL — ABNORMAL LOW (ref 13.0–17.0)
O2 Saturation: 98 %
Patient temperature: 37.1
Potassium: 4.3 mmol/L (ref 3.5–5.1)
Sodium: 142 mmol/L (ref 135–145)
TCO2: 24 mmol/L (ref 22–32)
pCO2 arterial: 49 mmHg — ABNORMAL HIGH (ref 32.0–48.0)
pH, Arterial: 7.278 — ABNORMAL LOW (ref 7.350–7.450)
pO2, Arterial: 130 mmHg — ABNORMAL HIGH (ref 83.0–108.0)

## 2021-09-08 LAB — ACID FAST SMEAR (AFB, MYCOBACTERIA): Acid Fast Smear: NEGATIVE

## 2021-09-08 LAB — GLUCOSE, CAPILLARY
Glucose-Capillary: 102 mg/dL — ABNORMAL HIGH (ref 70–99)
Glucose-Capillary: 102 mg/dL — ABNORMAL HIGH (ref 70–99)
Glucose-Capillary: 109 mg/dL — ABNORMAL HIGH (ref 70–99)
Glucose-Capillary: 115 mg/dL — ABNORMAL HIGH (ref 70–99)
Glucose-Capillary: 118 mg/dL — ABNORMAL HIGH (ref 70–99)
Glucose-Capillary: 121 mg/dL — ABNORMAL HIGH (ref 70–99)
Glucose-Capillary: 136 mg/dL — ABNORMAL HIGH (ref 70–99)
Glucose-Capillary: 142 mg/dL — ABNORMAL HIGH (ref 70–99)
Glucose-Capillary: 88 mg/dL (ref 70–99)

## 2021-09-08 LAB — COMPREHENSIVE METABOLIC PANEL
ALT: 14 U/L (ref 0–44)
AST: 61 U/L — ABNORMAL HIGH (ref 15–41)
Albumin: 3 g/dL — ABNORMAL LOW (ref 3.5–5.0)
Alkaline Phosphatase: 44 U/L (ref 38–126)
Anion gap: 7 (ref 5–15)
BUN: 35 mg/dL — ABNORMAL HIGH (ref 8–23)
CO2: 23 mmol/L (ref 22–32)
Calcium: 8.2 mg/dL — ABNORMAL LOW (ref 8.9–10.3)
Chloride: 110 mmol/L (ref 98–111)
Creatinine, Ser: 1.84 mg/dL — ABNORMAL HIGH (ref 0.61–1.24)
GFR, Estimated: 39 mL/min — ABNORMAL LOW (ref >60)
Glucose, Bld: 123 mg/dL — ABNORMAL HIGH (ref 70–99)
Potassium: 4.1 mmol/L (ref 3.5–5.1)
Sodium: 140 mmol/L (ref 135–145)
Total Bilirubin: 1 mg/dL (ref 0.3–1.2)
Total Protein: 4.9 g/dL — ABNORMAL LOW (ref 6.5–8.1)

## 2021-09-08 LAB — CBC
HCT: 34.8 % — ABNORMAL LOW (ref 39.0–52.0)
Hemoglobin: 11.1 g/dL — ABNORMAL LOW (ref 13.0–17.0)
MCH: 29.8 pg (ref 26.0–34.0)
MCHC: 31.9 g/dL (ref 30.0–36.0)
MCV: 93.5 fL (ref 80.0–100.0)
Platelets: 73 10*3/uL — ABNORMAL LOW (ref 150–400)
RBC: 3.72 MIL/uL — ABNORMAL LOW (ref 4.22–5.81)
RDW: 15.8 % — ABNORMAL HIGH (ref 11.5–15.5)
WBC: 14.7 10*3/uL — ABNORMAL HIGH (ref 4.0–10.5)
nRBC: 0 % (ref 0.0–0.2)

## 2021-09-08 LAB — COOXEMETRY PANEL
Carboxyhemoglobin: 0.9 % (ref 0.5–1.5)
Methemoglobin: 1.4 % (ref 0.0–1.5)
O2 Saturation: 78.9 %
Total hemoglobin: 11 g/dL — ABNORMAL LOW (ref 12.0–16.0)

## 2021-09-08 MED ORDER — INSULIN DETEMIR 100 UNIT/ML ~~LOC~~ SOLN
30.0000 [IU] | Freq: Two times a day (BID) | SUBCUTANEOUS | Status: DC
  Administered 2021-09-08 – 2021-09-18 (×19): 30 [IU] via SUBCUTANEOUS
  Filled 2021-09-08 (×23): qty 0.3

## 2021-09-08 MED ORDER — VITAL 1.5 CAL PO LIQD
1000.0000 mL | ORAL | Status: DC
  Administered 2021-09-09: 1000 mL

## 2021-09-08 MED ORDER — ALBUMIN HUMAN 5 % IV SOLN
12.5000 g | Freq: Once | INTRAVENOUS | Status: AC
Start: 2021-09-08 — End: 2021-09-08
  Administered 2021-09-08: 12.5 g via INTRAVENOUS

## 2021-09-08 MED ORDER — VITAL HIGH PROTEIN PO LIQD: 1000.0000 mL | ORAL | Status: DC

## 2021-09-08 MED ORDER — PROSOURCE TF PO LIQD
90.0000 mL | Freq: Three times a day (TID) | ORAL | Status: DC
  Administered 2021-09-08 – 2021-09-18 (×30): 90 mL
  Filled 2021-09-08 (×30): qty 90

## 2021-09-08 MED ORDER — CLONAZEPAM 1 MG PO TABS
1.0000 mg | ORAL_TABLET | Freq: Two times a day (BID) | ORAL | Status: DC
  Administered 2021-09-08 – 2021-09-16 (×17): 1 mg
  Filled 2021-09-08 (×17): qty 1

## 2021-09-08 MED ORDER — CLONAZEPAM 0.1 MG/ML ORAL SUSPENSION: 1.0000 mg | Freq: Two times a day (BID) | ORAL | Status: DC

## 2021-09-08 MED ORDER — ALBUMIN HUMAN 25 % IV SOLN
12.5000 g | Freq: Once | INTRAVENOUS | Status: DC
Start: 2021-09-08 — End: 2021-09-08

## 2021-09-08 MED ORDER — INSULIN ASPART 100 UNIT/ML IJ SOLN
0.0000 [IU] | INTRAMUSCULAR | Status: DC
  Administered 2021-09-08 – 2021-09-09 (×5): 3 [IU] via SUBCUTANEOUS
  Administered 2021-09-09: 4 [IU] via SUBCUTANEOUS
  Administered 2021-09-09: 3 [IU] via SUBCUTANEOUS
  Administered 2021-09-10: 2 [IU] via SUBCUTANEOUS
  Administered 2021-09-10 – 2021-09-11 (×6): 3 [IU] via SUBCUTANEOUS
  Administered 2021-09-11: 4 [IU] via SUBCUTANEOUS
  Administered 2021-09-11 – 2021-09-12 (×4): 3 [IU] via SUBCUTANEOUS
  Administered 2021-09-13: 4 [IU] via SUBCUTANEOUS
  Administered 2021-09-13: 3 [IU] via SUBCUTANEOUS
  Administered 2021-09-13 (×3): 4 [IU] via SUBCUTANEOUS
  Administered 2021-09-13 – 2021-09-14 (×2): 3 [IU] via SUBCUTANEOUS
  Administered 2021-09-14: 4 [IU] via SUBCUTANEOUS
  Administered 2021-09-14: 7 [IU] via SUBCUTANEOUS
  Administered 2021-09-14: 3 [IU] via SUBCUTANEOUS
  Administered 2021-09-14 – 2021-09-15 (×8): 4 [IU] via SUBCUTANEOUS
  Administered 2021-09-16: 3 [IU] via SUBCUTANEOUS
  Administered 2021-09-16: 4 [IU] via SUBCUTANEOUS
  Administered 2021-09-16 (×3): 3 [IU] via SUBCUTANEOUS
  Administered 2021-09-16 – 2021-09-17 (×4): 4 [IU] via SUBCUTANEOUS
  Administered 2021-09-18: 7 [IU] via SUBCUTANEOUS
  Administered 2021-09-18: 4 [IU] via SUBCUTANEOUS
  Administered 2021-09-18: 7 [IU] via SUBCUTANEOUS
  Administered 2021-09-18: 4 [IU] via SUBCUTANEOUS
  Administered 2021-09-18 (×2): 11 [IU] via SUBCUTANEOUS
  Administered 2021-09-19: 7 [IU] via SUBCUTANEOUS
  Administered 2021-09-19 (×4): 4 [IU] via SUBCUTANEOUS
  Administered 2021-09-19: 7 [IU] via SUBCUTANEOUS
  Administered 2021-09-19: 4 [IU] via SUBCUTANEOUS
  Administered 2021-09-20: 3 [IU] via SUBCUTANEOUS
  Administered 2021-09-20: 4 [IU] via SUBCUTANEOUS
  Administered 2021-09-20 (×2): 7 [IU] via SUBCUTANEOUS
  Administered 2021-09-20 – 2021-09-21 (×3): 4 [IU] via SUBCUTANEOUS
  Administered 2021-09-21: 5 [IU] via SUBCUTANEOUS
  Administered 2021-09-21 – 2021-09-22 (×6): 4 [IU] via SUBCUTANEOUS
  Administered 2021-09-22: 3 [IU] via SUBCUTANEOUS

## 2021-09-08 MED ORDER — VITAL 1.5 CAL PO LIQD
1000.0000 mL | ORAL | Status: DC
  Administered 2021-09-08: 1000 mL

## 2021-09-08 NOTE — Progress Notes (Signed)
NAME:  [name] Geck., MRN:  [id number], DOB:  September 10, 1951, LOS: 2 ADMISSION DATE:  08/30/2021, CONSULTATION DATE:  09/07/21 REFERRING MD:  Dr. [name], CHIEF COMPLAINT:  Resp failure   History of Present Illness:  HPI obtained from medical chart review as patient is intubated and sedated on mechanical ventilation.   70 year old male with prior history of prior tobacco abuse (quit 2010), centrilobular emphysema, chronic hypoxic respiratory failure on home O2 (?4L [place]), pulmonary hypertension, OSA on CPAP, PAF, CAD w/ LAD stent 2003, HLD, MSSA sepsis c/b lumbar discitis and infected left sternoclavicular joint 08/2019 who recently had worsening exertional dyspnea, orthopnea, and fatigue.  Had TTE in 04/2021 which showed new moderate to severe AI with normal EF 55-60 and new dilation of LV cavity with trivial MR and mild TR, normal RV.  Subsequently, underwent TEE on 07/10/21 which shoed EF 55-60%, mild dilation of ascending aorta, and severe AI.  Underwent [facility]  07/16/21 which showed patent LAD stent, mild diffuse nonobstructive CAD, moderate pulmonary hypertension with PA 67/20 (39) and a pulmonary wedge pressure of 56mHg.  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  Found to have AV vegetations which where sent for culture.  EBL during surgery 3025 ml with 1870 ml in blood products/ cell saver.  Patient did have some trouble transitioning off CPB with high PA pressures with struggling systemic pressures and RV, therefore ended up on NO and vasopressor support with milrinone.  He remains sedated and intubated with tenuous hemodynamics today.  PCCM consulted to help with vent management.  HF also being consulted to help with pulmonary hypertension.    Of note, patient is followed in our pulmonary office by Dr. SHalford Chessman last seen 08/10/21 by BDerl Barrow NP.  Previous CTA 6/22 neg for PE, noted underlying emphysematous changes with areas  of fibrosis and scarring, nodular opacity in RML (smaller now than previous imaging).  Reported compliance with CPAP and anoro.  PFT 08/09/21- FVC 3.42 (75%), FEV1 2. 60 (77%), ratio 76, DLCOunc 10/29 (38%).  Uses O2 2L at rest, and 3-4L with exertions for goal sats > 88-90%.  Pertinent  Medical History  Former Tobacco abuse (quit 2010), centrilobular emphysema, pulmonary hypertension, OSA on CPAP, PAF, CAD w/ LAD stent 2003, HLD, MSSA sepsis c/b lumbar discitis and infected left sternoclavicular joint 08/2019   Significant Hospital Events: Including procedures, antibiotic start and stop dates in addition to other pertinent events   9/8 aortic valve replacement with bioprosthetic valve and biatrial Maze procedure  9/8 ETT >> 9/8 foley >> 9/8 R IJ cordis with PA cath >> 9/8 L radial Aline >> 9/8 L femoral aline (placed in OR given variable L radial tracings)>> 9/8 parasternal pacing wires >> 9/8 mediastinal CT x 2 >>  9/8 aortic tissue cx >> 9/8 aortic tissue AFB >> 9/8 aortic fungal >> 9/9 Bcx 2 >>  9/8 ancef >>9/9 9/8 vanc >> 9/9 cefepime >>  Interim History / Subjective:  Weaned off vasopressin this morning. Had episodes of hypotension overnight treated with albumin boluses. This morning woke up quickly when sedation was decreased.  CVP 11 PA 54/29  (38)  7.28/49/129/23 on rate 18, PEEP 14, FiO2 70%,   Objective   Blood pressure 95/63, pulse 90, temperature 97.9 F (36.6 C), resp. rate (!) 22, height '6\' 1"'$  (1.854 m), weight 136.1 kg, SpO2 96 %. PAP: (42-67)/(23-47) 52/29 CVP:  [1 mmHg-25 mmHg] 13 mmHg  CO:  [6.4 L/min-9.9 L/min] 9.7 L/min CI:  [2.5 L/min/m2-3.9 L/min/m2] 3.8 L/min/m2  Vent Mode: PRVC FiO2 (%):  [70 %-80 %] 70 % Set Rate:  [20 bmp-22 bmp] 22 bmp Vt Set:  [630 mL] 630 mL PEEP:  [10 [name]20-14 [name]20] 14 [name]20 Pressure Support:  [10 [name]20] 10 [name]20 Plateau Pressure:  [23 [name]20-30 [name]20] 27 [name]20   Intake/Output Summary (Last 24 hours) at 09/08/2021 0737 Last  data filed at 09/08/2021 0700 Gross per 24 hour  Intake 5288.82 ml  Output 2890 ml  Net 2398.82 ml    Filed Weights   09/14/2021 0801  Weight: 136.1 kg    Examination: General:  critically ill appearing man lying in bed intubated, sedated HEENT: [place]/AT, ETT in place Neuro:  RASS +5, sedated now. When awake was able to move all extremities and breathe above the vent. CV: paced rhythm, murmur across precordium PULM:  minimal ETT secretions, CTA, Pplat 25, DP 11 GI: obese, soft, NT. Healed R sided scar. Extremities: edema in RUE and LLE disproportionaltely.   Skin: warm, dry, no rashes.  Coox 79% BUN 35 Cr 1.84 WBC 14.7 H/H 11.1/34.8 Platelets 73 Valve cultures: predominantly monocytes, no organisms Fungal-pending AFB-pending 9/9 blood cultures> pending  CXR personally reviewed> cardiomegaly, RML opacity  Resolved Hospital Problem list    Assessment & Plan:   Acute on chronic hypoxic respiratory failure (on home O2 at home) OSA, on home CPAP Emphysema/ COPD Former tobacco abuse  -PRVC, LTVV 4-8cc/kg IBW with goal Pplat<30 and DP<15. Meeting goals. -VAP prevention protocol -PAD protocol for sedation. Adding clonazepam. Versed PRN. Precedex and fentanyl infusions.  - Wean FiO2 before PEEP or iNO - Daily SAT & SBT as appropriate - con't nebulized bronchodilators- brovana and yupelri. 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 Mononuclear cells on path suggests a more chronic process.  -con't to follow operative and blood cultures - Con't broad spectrum antibiotics - trend WBC/ fever curve   Cardiogenic shock due to decompensated RV failure. Baseline PA pressure 67/20, mean PA 39 from [place] 06/2021. (Calculated PVR 2.18) Pulmonary hypertension causing acute RV failure OSA-- concern that this may be undertreated despite compliance with CPAP -con't milrinone, norepi, epinephrine -con't iNO -wean FiO2 before PEEP and iNO -suspect  he may require sildenafil for PH -Maintain adequate preload; keep CVP >10 for now -Con't PA pressure monitoring.  AI s/p AVR and MAZE procedure Hx PAF, CAD with LAD stent Shock- likely multifactorial - cardiogenic and possibly septic.  - post-op management per TCTS - pacing back on  DDD, did not tolerate/ capture well on AAI with increased sedation - mediastinal drains with moderate output, to remain in place for now -con't inotropic support - serial hemodynamics.  CO/ CI reassuring.    - ASA, lipitor daily  AKI on chronic CKD stage 3B, likely r/t hypoperfusion - continue supportive care, MAP >70 - con't to monitor UOP. Con't foley catheter. - daily labs -Renally dose meds, avoid nephrotoxic meds  Mild acute anemia, likely due to operative blood loss - Transfuse for hemoglobin less than 7 or hemodynamically significant bleeding. - Continue to monitor daily  Acute thrombocytopenia, likely consumptive related to surgery and bypass - Monitor  DM with hyperglycemia; A1c 6.5 - insulin gtt per protocol  -Goal BG <180  Morbid obesity, BMI 39.5 kg/m2 -Long-term recommend moderate weight loss  Best Practice (right click and "Reselect all SmartList Selections" daily)   Diet/type: tubefeeds DVT prophylaxis: SCD, defer to  TCTS GI prophylaxis: PPI Lines: yes and it is still needed Foley:  Yes, and it is still needed Code Status:  full code Last date of multidisciplinary goals of care discussion [son updated at bedside 9/9 ]  Labs   CBC: Recent Labs  Lab 09/16/2021 1735 09/26/2021 1916 09/07/2021 2131 09/07/21 0403 09/07/21 0406 09/07/21 0926 09/07/21 1148 09/07/21 1637 09/08/21 0346  WBC 20.8*  --  21.1*  --  15.9*  --   --  15.0* 14.7*  HGB 13.4   < > 13.3   < > 12.9* 11.9* 11.6* 11.5* 11.1*  HCT 42.1   < > 42.2   < > 40.5 35.0* 34.0* 36.1* 34.8*  MCV 94.4  --  93.8  --  93.5  --   --  93.3 93.5  PLT 140*  --  135*  --  118*  --   --  82* 73*   < > = values in this  interval not displayed.     Basic Metabolic Panel: Recent Labs  Lab 09/04/21 1130 09/15/2021 0951 08/30/2021 1637 09/08/2021 1734 09/07/2021 2131 09/07/21 0403 09/07/21 0406 09/07/21 0926 09/07/21 1148 09/07/21 1637 09/08/21 0346  NA 137   < > 145   < > 140   < > 139 142 142 139 140  K 4.6   < > 4.3   < > 3.7   < > 3.9 4.0 4.0 4.1 4.1  CL 107   < > 108  --  108  --  107  --   --  109 110  CO2 20*  --   --   --  23  --  24  --   --  24 23  GLUCOSE 126*   < > 147*  --  208*  --  162*  --   --  108* 123*  BUN 28*   < > 32*  --  31*  --  33*  --   --  35* 35*  CREATININE 1.44*   < > 1.70*  --  1.87*  --  1.98*  --   --  1.98* 1.84*  CALCIUM 9.2  --   --   --  8.4*  --  8.6*  --   --  8.0* 8.2*  MG  --   --   --   --  3.0*  --  3.0*  --   --  2.9*  --    < > = values in this interval not displayed.    GFR: Estimated Creatinine Clearance: 53.3 mL/min (A) (by C-G formula based on SCr of 1.84 mg/dL (H)). Recent Labs  Lab 09/21/2021 2131 09/07/21 0406 09/07/21 1637 09/08/21 0346  WBC 21.1* 15.9* 15.0* 14.7*     Liver Function Tests: Recent Labs  Lab 09/04/21 1130 09/08/21 0346  AST 26 61*  ALT 32 14  ALKPHOS 50 44  BILITOT 1.5* 1.0  PROT 6.4* 4.9*  ALBUMIN 3.8 3.0*    No results for input(s): LIPASE, AMYLASE in the last 168 hours. No results for input(s): AMMONIA in the last 168 hours.  ABG    Component Value Date/Time   PHART 7.342 (L) 09/07/2021 1148   PCO2ART 45.5 09/07/2021 1148   PO2ART 78 (L) 09/07/2021 1148   HCO3 24.8 09/07/2021 1148   TCO2 26 09/07/2021 1148   ACIDBASEDEF 1.0 09/07/2021 1148   O2SAT 78.9 09/08/2021 0346      This patient is critically ill with multiple organ system failure which  requires frequent high complexity decision making, assessment, support, evaluation, and titration of therapies. This was completed through the application of advanced monitoring technologies and extensive interpretation of multiple databases. During this encounter  critical care time was devoted to patient care services described in this note for 45 minutes.  Julian Hy, DO 09/08/21 10:43 AM [facility]

## 2021-09-08 NOTE — Progress Notes (Signed)
Nutrition Follow-up  DOCUMENTATION CODES:   Not applicable  INTERVENTION:   Initiate tube feeding via OG tube: Vital 1.5 at 50 ml/h (1200 ml per day) Prosource TF 90 ml TID  Provides 2040 kcal, 147 gm protein, 912 ml free water daily.  NUTRITION DIAGNOSIS:   Inadequate oral intake related to acute illness as evidenced by NPO status.  Ongoing   GOAL:   Patient will meet greater than or equal to 90% of their needs  Progressing with initiation of TF  MONITOR:   Vent status, TF tolerance, Labs, Weight trends  REASON FOR ASSESSMENT:   Consult Enteral/tube feeding initiation and management  ASSESSMENT:   70 yo male admitted with acute on chronic respiratory failure requiring vent support, AV vegetations suggestive of endocarditis, AKI on CKD. PMH includes CKD III, DM, COPD, CHF, HTN  S/P AVR & TEE 9/8.  OG tube in place. Received MD Consult for TF initiation and management.  Patient is currently intubated on ventilator support MV: 15.2 L/min Temp (24hrs), Avg:97.3 F (36.3 C), Min:95.4 F (35.2 C), Max:97.9 F (36.6 C)   Labs reviewed.  CBG: 102-136-88-118  Medications reviewed and include colace, novolog, levemir, precedex, epinephrine, insulin drip, levophed, vasopressin. Insulin drip being stopped today.   IVF: LR at 20 ml/h  No new weight since 9/8, 136.1 kg  I/O +6.25 L since admission Chest tube output 380 ml x 24 hours NG output 1250 ml x 24 hours (none since 7 AM)  Diet Order:   Diet Order             Diet NPO time specified  Diet effective now                   EDUCATION NEEDS:   Not appropriate for education at this time  Skin:  Skin Assessment: Reviewed RN Assessment  Last BM:  9/7  Height:   Ht Readings from Last 1 Encounters:  09/26/2021 '6\' 1"'$  (1.854 m)    Weight:   Wt Readings from Last 1 Encounters:  09/18/2021 136.1 kg    BMI:  Body mass index is 39.58 kg/m.  Estimated Nutritional Needs:   Kcal:  2000-2200  kcals  Protein:  135-160 g  Fluid:  >/= 2 L    Lucas Mallow, RD, LDN, CNSC Please refer to Amion for contact information.

## 2021-09-08 NOTE — Progress Notes (Signed)
2 Days Post-Op Procedure(s) (LRB): AORTIC VALVE REPLACEMENT (AVR) USING INSPIRIS AORTIC VALVE 27 MM (N/A) MAZE (N/A) TRANSESOPHAGEAL ECHOCARDIOGRAM (TEE) (N/A) APPLICATION OF CELL SAVER CLIPPING OF ATRIAL APPENDAGE USING ATRICLIP PRO 240 Subjective: Remains intubated and sedated on vent   He was stable all day yesterday and NE was weaned to 10. Vaso weaned overnight. He reportedly had a few episode of hypotension associated with coughing episodes and NE increased and he received two bottles of albumin to get BP back up. BP 140 this am when I saw him with PA 50/22. He then woke up and was agitated trying to sit up. He is very strong and required multiple people to restrain him until more sedation given. BP dropped and PA pressure increased acutely with that episode. Everything has settled back down with more sedation and time.   CI 4.3 and Co-ox 79 this am.   Objective: Vital signs in last 24 hours: Temp:  [95.4 F (35.2 C)-98.1 F (36.7 C)] 97.9 F (36.6 C) (09/10 0600) Pulse Rate:  [73-95] 73 (09/10 0835) Cardiac Rhythm: A-V Sequential paced (09/10 0400) Resp:  [7-24] 22 (09/10 0835) BP: (89-138)/(41-78) 128/44 (09/10 0835) SpO2:  [92 %-98 %] 97 % (09/10 0835) Arterial Line BP: (78-156)/(37-87) 112/46 (09/10 0600) FiO2 (%):  [70 %-80 %] 70 % (09/10 0835)  Hemodynamic parameters for last 24 hours: PAP: (42-67)/(23-47) 52/29 CVP:  [7 mmHg-25 mmHg] 13 mmHg CO:  [6.4 L/min-9.9 L/min] 9.7 L/min CI:  [2.5 L/min/m2-3.9 L/min/m2] 3.8 L/min/m2  Intake/Output from previous day: 09/09 0701 - 09/10 0700 In: 5288.8 [I.V.:3454; NG/GT:280; IV Piggyback:1554.8] Out: 2890 [Urine:1260; Emesis/NG output:1250; Chest Tube:380] Intake/Output this shift: No intake/output data recorded.  General appearance: sedated on vent. Neurologic: moving all extremities strongly when he woke up.  Heart: regular rate and rhythm, S1, S2 normal, no murmur Lungs: clear to auscultation bilaterally Abdomen:  soft, non-tender; no bowel sounds Extremities: edema moderate edema, warm Wound: dressing dry  Lab Results: Recent Labs    09/07/21 1637 09/08/21 0346  WBC 15.0* 14.7*  HGB 11.5* 11.1*  HCT 36.1* 34.8*  PLT 82* 73*   BMET:  Recent Labs    09/07/21 1637 09/08/21 0346  NA 139 140  K 4.1 4.1  CL 109 110  CO2 24 23  GLUCOSE 108* 123*  BUN 35* 35*  CREATININE 1.98* 1.84*  CALCIUM 8.0* 8.2*    PT/INR:  Recent Labs    09/12/2021 1735  LABPROT 18.6*  INR 1.6*   ABG    Component Value Date/Time   PHART 7.342 (L) 09/07/2021 1148   HCO3 24.8 09/07/2021 1148   TCO2 26 09/07/2021 1148   ACIDBASEDEF 1.0 09/07/2021 1148   O2SAT 78.9 09/08/2021 0346   CBG (last 3)  Recent Labs    09/08/21 0346 09/08/21 0514 09/08/21 0634  GLUCAP 136* 88 118*   CXR: pulmonary vascular congestion, chronic changes, decreased aeration of left base although his heart is very large and probably obscures the left base to some degree.  Assessment/Plan: S/P Procedure(s) (LRB): AORTIC VALVE REPLACEMENT (AVR) USING INSPIRIS AORTIC VALVE 27 MM (N/A) MAZE (N/A) TRANSESOPHAGEAL ECHOCARDIOGRAM (TEE) (N/A) APPLICATION OF CELL SAVER CLIPPING OF ATRIAL APPENDAGE USING ATRICLIP PRO 240  POD 2    He is hemodynamically unstable when waking up this am. CO and Co-ox good. Continue milrinone and epi at current levels for RV support with severe pulmonary HTN. NE and vaso as needed for BP support and can titrate as needed. Continue NO.  He has severe  fixed pulmonary hypertension. Will ask heart failure team for help with management of this.    Underlying pulmonary disease with long smoking hx, resting hypoxemia preop and cobblestone appearance of lungs in OR. PFT's showed mild obstructive defect and severe diffusion defect at 38% of predicted. He had PA pressure of 90/45 preop. PA pressure did not decrease much with correction of his severe AI. CCM is consulting.   Stage 3 CKD with baseline creat about 1.5  with postop acute kidney injury due to hypotension. Creat stable and down slightly today to 1.84. Will need to get some volume off at some point but he will not tolerate this now.   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 Operative GS showed WBC but no organisms. Culture pending. Continue antibiotics for now.   Morbid obesity.   DM: glucose under adequate control on insulin drip 1.6. Will switch to Levemir and SSI.   LOS: 2 days    [name] 09/08/2021

## 2021-09-08 NOTE — Progress Notes (Signed)
Patient ID: [name] Woltman., male   DOB: Jan 25, 1951, 70 y.o.   MRN: [id number] TCTS Evening Rounds:  He has been stable today on milrinone 0.375, epi 8, NE 24, NO 30.  Vasopressin is off.  PA 47/25, CVP 9, CI 4.7  Remains sedated on vent 60% FiO2, 12 peep.  ARTERIAL   Sample type                 pH, Arterial            7.278   pH, Arterial    pCO2 arterial            49.0   pCO2 arterial    pO2, Arterial            130   pO2, Arterial    TCO2            24   TCO2    Acid-base deficit            4.0   Acid-base deficit    Bicarbonate            22.9   Bicarbonate    O2 Saturation        78.9    98.0   O2 Saturation    Patient temperature            37.1 C       UO 50/hr. Chest tube output low.

## 2021-09-09 ENCOUNTER — Inpatient Hospital Stay (HOSPITAL_COMMUNITY): Payer: Medicare Other

## 2021-09-09 DIAGNOSIS — I272 Pulmonary hypertension, unspecified: Secondary | ICD-10-CM | POA: Diagnosis not present

## 2021-09-09 DIAGNOSIS — Z9911 Dependence on respirator [ventilator] status: Secondary | ICD-10-CM

## 2021-09-09 DIAGNOSIS — J9601 Acute respiratory failure with hypoxia: Secondary | ICD-10-CM | POA: Diagnosis not present

## 2021-09-09 DIAGNOSIS — R570 Cardiogenic shock: Secondary | ICD-10-CM | POA: Diagnosis not present

## 2021-09-09 DIAGNOSIS — I351 Nonrheumatic aortic (valve) insufficiency: Secondary | ICD-10-CM | POA: Diagnosis not present

## 2021-09-09 DIAGNOSIS — I5031 Acute diastolic (congestive) heart failure: Secondary | ICD-10-CM | POA: Diagnosis not present

## 2021-09-09 DIAGNOSIS — Z953 Presence of xenogenic heart valve: Secondary | ICD-10-CM | POA: Diagnosis not present

## 2021-09-09 LAB — PHOSPHORUS: Phosphorus: 2.7 mg/dL (ref 2.5–4.6)

## 2021-09-09 LAB — CBC
HCT: 33.7 % — ABNORMAL LOW (ref 39.0–52.0)
Hemoglobin: 10.8 g/dL — ABNORMAL LOW (ref 13.0–17.0)
MCH: 30.3 pg (ref 26.0–34.0)
MCHC: 32 g/dL (ref 30.0–36.0)
MCV: 94.4 fL (ref 80.0–100.0)
Platelets: 79 10*3/uL — ABNORMAL LOW (ref 150–400)
RBC: 3.57 MIL/uL — ABNORMAL LOW (ref 4.22–5.81)
RDW: 15.8 % — ABNORMAL HIGH (ref 11.5–15.5)
WBC: 11.8 10*3/uL — ABNORMAL HIGH (ref 4.0–10.5)
nRBC: 0 % (ref 0.0–0.2)

## 2021-09-09 LAB — POCT I-STAT 7, (LYTES, BLD GAS, ICA,H+H)
Acid-base deficit: 1 mmol/L (ref 0.0–2.0)
Bicarbonate: 23.2 mmol/L (ref 20.0–28.0)
Calcium, Ion: 1.25 mmol/L (ref 1.15–1.40)
HCT: 31 % — ABNORMAL LOW (ref 39.0–52.0)
Hemoglobin: 10.5 g/dL — ABNORMAL LOW (ref 13.0–17.0)
O2 Saturation: 98 %
Patient temperature: 36.3
Potassium: 3.8 mmol/L (ref 3.5–5.1)
Sodium: 142 mmol/L (ref 135–145)
TCO2: 24 mmol/L (ref 22–32)
pCO2 arterial: 36.2 mmHg (ref 32.0–48.0)
pH, Arterial: 7.413 (ref 7.350–7.450)
pO2, Arterial: 102 mmHg (ref 83.0–108.0)

## 2021-09-09 LAB — BASIC METABOLIC PANEL
Anion gap: 6 (ref 5–15)
Anion gap: 7 (ref 5–15)
BUN: 30 mg/dL — ABNORMAL HIGH (ref 8–23)
BUN: 32 mg/dL — ABNORMAL HIGH (ref 8–23)
CO2: 23 mmol/L (ref 22–32)
CO2: 23 mmol/L (ref 22–32)
Calcium: 8.3 mg/dL — ABNORMAL LOW (ref 8.9–10.3)
Calcium: 8.2 mg/dL — ABNORMAL LOW (ref 8.9–10.3)
Chloride: 112 mmol/L — ABNORMAL HIGH (ref 98–111)
Chloride: 110 mmol/L (ref 98–111)
Creatinine, Ser: 1.41 mg/dL — ABNORMAL HIGH (ref 0.61–1.24)
Creatinine, Ser: 1.48 mg/dL — ABNORMAL HIGH (ref 0.61–1.24)
GFR, Estimated: 53 mL/min — ABNORMAL LOW (ref >60)
GFR, Estimated: 50 mL/min — ABNORMAL LOW (ref >60)
Glucose, Bld: 145 mg/dL — ABNORMAL HIGH (ref 70–99)
Glucose, Bld: 175 mg/dL — ABNORMAL HIGH (ref 70–99)
Potassium: 4 mmol/L (ref 3.5–5.1)
Potassium: 3.9 mmol/L (ref 3.5–5.1)
Sodium: 141 mmol/L (ref 135–145)
Sodium: 140 mmol/L (ref 135–145)

## 2021-09-09 LAB — ECHOCARDIOGRAM COMPLETE
AR max vel: 2.54 cm2
AV Area VTI: 2.52 cm2
AV Area mean vel: 2.43 cm2
AV Mean grad: 11 mmHg
AV Peak grad: 19.9 mmHg
Ao pk vel: 2.23 m/s
Area-P 1/2: 3.72 cm2
Height: 73 in
MV VTI: 3.8 cm2
S' Lateral: 3.8 cm
Single Plane A2C EF: 37.3 %
Weight: 5044.12 oz

## 2021-09-09 LAB — GLUCOSE, CAPILLARY
Glucose-Capillary: 134 mg/dL — ABNORMAL HIGH (ref 70–99)
Glucose-Capillary: 136 mg/dL — ABNORMAL HIGH (ref 70–99)
Glucose-Capillary: 113 mg/dL — ABNORMAL HIGH (ref 70–99)
Glucose-Capillary: 140 mg/dL — ABNORMAL HIGH (ref 70–99)
Glucose-Capillary: 176 mg/dL — ABNORMAL HIGH (ref 70–99)
Glucose-Capillary: 145 mg/dL — ABNORMAL HIGH (ref 70–99)

## 2021-09-09 LAB — COOXEMETRY PANEL
Carboxyhemoglobin: 1.1 % (ref 0.5–1.5)
Methemoglobin: 1.4 % (ref 0.0–1.5)
O2 Saturation: 77.7 %
Total hemoglobin: 10.2 g/dL — ABNORMAL LOW (ref 12.0–16.0)

## 2021-09-09 LAB — MAGNESIUM: Magnesium: 2.6 mg/dL — ABNORMAL HIGH (ref 1.7–2.4)

## 2021-09-09 MED ORDER — POLYETHYLENE GLYCOL 3350 17 G PO PACK
17.0000 g | PACK | Freq: Every day | ORAL | Status: DC
  Administered 2021-09-09 – 2021-09-22 (×13): 17 g
  Filled 2021-09-09 (×14): qty 1

## 2021-09-09 MED ORDER — SENNA 8.6 MG PO TABS
1.0000 | ORAL_TABLET | Freq: Every day | ORAL | Status: DC
  Administered 2021-09-09 – 2021-09-14 (×6): 8.6 mg
  Filled 2021-09-09 (×6): qty 1

## 2021-09-09 MED ORDER — FUROSEMIDE 10 MG/ML IJ SOLN
40.0000 mg | Freq: Once | INTRAMUSCULAR | Status: AC
  Administered 2021-09-09: 40 mg via INTRAVENOUS
  Filled 2021-09-09: qty 4

## 2021-09-09 NOTE — Consult Note (Signed)
Advanced Heart Failure Team Consult Note   Primary Physician: Burnis Medin, MD PCP-Cardiologist:  [name], MD  Reason for Consultation: Post-op shock/pulmonary hypertension  HPI:    [name] [name]. is seen today for evaluation of post-op shock/pulmonary hypertension at the request of Dr. [name].   70 y.o. with history of COPD/smoking, chronic severe aortic insufficiency, paroxysmal atrial fibrillation, CAD, type 2 diabetes, and CKD stage 3.  Patient had PCI to LAD in 12/03, LHC in 7/22 showed only mild CAD.  He has paroxysmal atrial fibrillation and had been maintained on Tikosyn.  He had not been ablated due to obesity.  Patient had lumbar discitis and left sternoclavicular joint infection in 9/20, had MSSA.  He had I&D left [place]ler joint and IV abx.  Echo at the time showed no vegetation and trivial AI.  Patient has had COPD (quit smoking in 2010) with CTA chest in 6/22 showing emphysema as well as areas of fibrosis.  He has OSA.  He is on 4L home oxygen.  In 5/22, echo showed moderate-severe AI.  TEE in 7/22 showed severe AI with tricuspid aortic valve; LV EF 55-60% with moderate LV enlargement, mild RV enlargement with normal function.  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    Patient saw Dr. [name] due to severe AI.  On 09/23/2021, he had bioprosthetic AVR with Maze.  The aortic valve was perforated and there appeared to be vegetation present.  Tissue culture so far NGTD.  Patient was noted to have PA pressure 90/45 range in OR pre-op, this did not correct post-op.  Patient ended up on iNO 30 ppm, milrinone, epinephrine, NE.  SVR was low, broad spectrum antibiotics started with vancomycin/cefepime.    Currently, he remains intubated.  PA pressures have come down considerably, currently 39/28 with PCWP 27 and RA pressure 15, CI 2.6.  He is afebrile with stable MAP on epinephrine 8, NE 4, milrinone 0.375, iNO 30 ppm.  Creatinine down to  1.4 today, he has had Lasix 40 mg IV x 1.  Co-ox 78%.  He is A-V sequentially paced.   Review of Systems: All systems reviewed and negative except as per HPI.   Home Medications Prior to Admission medications   Medication Sig Start Date End Date Taking? Authorizing Provider  acetaminophen (TYLENOL) 325 MG tablet Take 650 mg by mouth every 6 (six) hours as needed for mild pain.   Yes [provider]  ALPRAZolam (XANAX) 0.5 MG tablet Take 1 tablet (0.5 mg total) by mouth 2 (two) times daily as needed for anxiety. 08/31/21  Yes Elwanda Brooklyn, NP  apixaban (ELIQUIS) 5 MG TABS tablet Take 1 tablet (5 mg total) by mouth 2 (two) times daily. 07/17/21  Yes Jettie Booze, MD  atorvastatin (LIPITOR) 80 MG tablet Take 1 tablet (80 mg total) by mouth daily. 07/16/21  Yes Jettie Booze, MD  brimonidine (ALPHAGAN) 0.15 % ophthalmic solution Place 1 drop into both eyes 3 (three) times daily. 07/04/20  Yes [provider]  busPIRone (BUSPAR) 15 MG tablet Take one tablet twice daily. 08/31/21  Yes White, Aaron Edelman A, NP  Cholecalciferol (VITAMIN D) 50 MCG (2000 UT) tablet Take 2,000-4,000 Units by mouth See admin instructions. Take 2000 units by mouth in the morning and 4000 units in the evening   Yes [provider]  diphenhydrAMINE (BENADRYL) 25 MG tablet Take 25-50 mg by mouth at bedtime as needed for sleep.  Yes [provider]  dofetilide (TIKOSYN) 500 MCG capsule Take 1 capsule (500 mcg total) by mouth 2 (two) times daily. 07/16/21  Yes Jettie Booze, MD  famotidine (PEPCID) 10 MG tablet Take 10 mg by mouth daily as needed for heartburn.   Yes [provider]  fexofenadine (ALLEGRA) 180 MG tablet Take 180 mg by mouth daily as needed for allergies.    Yes [provider]  fish oil-omega-3 fatty acids 1000 MG capsule Take 1 g by mouth 2 (two) times daily.   Yes [provider]  fluticasone (FLONASE) 50 MCG/ACT nasal spray Place 2 sprays  into both nostrils daily. 07/16/21  Yes Jettie Booze, MD  hydrALAZINE (APRESOLINE) 100 MG tablet Take 1 tablet (100 mg total) by mouth 3 (three) times daily. 06/18/21 09/16/21 Yes Lelon Perla, MD  losartan (COZAAR) 100 MG tablet Take 1 tablet (100 mg total) by mouth daily. 11/15/20  Yes Lelon Perla, MD  Magnesium 250 MG TABS Take 250-500 mg by mouth See admin instructions. Takes 250 mg by mouth in the morning and 500 mg in the evening   Yes [provider]  metFORMIN (GLUCOPHAGE-XR) 500 MG 24 hr tablet Take 1 tablet (500 mg total) by mouth in the morning, at noon, and at bedtime. 07/16/21  Yes Jettie Booze, MD  MULTIPLE VITAMIN PO Take 1 tablet by mouth every evening.   Yes [provider]  PARoxetine (PAXIL) 20 MG tablet TAKE 2&1/2 TABLETS BY MOUTH DAILY Patient taking differently: Take 60 mg by mouth daily. 07/16/21  Yes Panosh, Standley Brooking, MD  potassium chloride SA (KLOR-CON M20) 20 MEQ tablet Take 1 tablet (20 mEq total) by mouth daily. 07/16/21  Yes Jettie Booze, MD  traZODone (DESYREL) 50 MG tablet TAKE 1 TABLET BY MOUTH EVERYDAY AT BEDTIME Patient taking differently: Take 50 mg by mouth at bedtime. 07/08/21  Yes Panosh, Standley Brooking, MD  triamcinolone cream (KENALOG) 0.1 % Apply 1 application topically 2 (two) times daily as needed (irritation). 07/16/21  Yes Jettie Booze, MD  umeclidinium-vilanterol [facility] ELLIPTA) 62.5-25 MCG/INH AEPB Inhale 1 puff into the lungs daily. 07/16/21  Yes Jettie Booze, MD  albuterol (PROVENTIL HFA;VENTOLIN HFA) 108 (90 Base) MCG/ACT inhaler Inhale 2 puffs into the lungs every 6 (six) hours as needed for wheezing or shortness of breath. 03/24/17   Panosh, Standley Brooking, MD  Blood Glucose Monitoring Suppl (ONETOUCH VERIO) w/Device KIT Use to test blood sugars 1-2 times daily. 12/04/20   Panosh, Standley Brooking, MD  busPIRone (BUSPAR) 10 MG tablet Take 1 tablet (10 mg total) by mouth 2 (two) times daily. 06/07/21   Elwanda Brooklyn,  NP  COVID-19 mRNA Vac-TriS, Pfizer, (PFIZER-BIONT COVID-19 VAC-TRIS) SUSP injection Inject into the muscle. 07/26/21   Carlyle Basques, MD  Lancets [name] ULTRASOFT) lancets Twice daily 12/01/19   Panosh, Standley Brooking, MD  methocarbamol (ROBAXIN) 500 MG tablet TAKE 1 TABLET BY MOUTH 4 TIMES A DAY AS NEEDED FOR MUSCLE SPASM Patient taking differently: Take 500 mg by mouth 4 (four) times daily as needed for muscle spasms. 01/26/20   Lovorn, [name], MD  nitroGLYCERIN (NITROSTAT) 0.4 MG SL tablet Place 1 tablet (0.4 mg total) under the tongue every 5 (five) minutes as needed for chest pain. 02/01/19   Lelon Perla, MD  [facility] VERIO test strip USE AS DIRECTED TWICE A DAY 12/11/20   Panosh, Standley Brooking, MD  pantoprazole (PROTONIX) 40 MG tablet Take 40 mg by mouth daily  as needed (acid reflux). 08/05/19   [provider]  tamsulosin (FLOMAX) 0.4 MG CAPS capsule Take 1 capsule (0.4 mg total) by mouth daily as needed (repeat kidney stone). 07/16/21   Jettie Booze, MD    Past Medical History: 1. Atrial fibrillation: Paroxysmal. H/o DCCV.  Was on Tikosyn.  - Maze 09/03/2021.  2. COPD: Quit smoking 2010.  - CT chest in 6/22 with emphysema as well as areas of fibrosis.  - On home oxygen.  3. CAD: h/o LAD PCI in 12/03.  - LHC (7/22): No significant CAD.  4. Thoracic aortic aneurysm: 4.4 cm by last imaging.  5. Type 2 diabetes 6. 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  7. OSA: CPAP.  8. CKD stage 3 9. 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  - TEE (7/22): EF 55-60%, moderate LV enlargement, mild RV enlargement with normal function, severe AI with tricuspid aortic valve.  - Surgical AVR with bioprosthetic aortic valve 9/22.  Native valve was perforated with vegetation noted.  10. Pulmonary hypertension: Suspected group 2/3.   - RHC (7/22) with PA 67/20 mean 35, mean PCWP 17, CI 4.  11. Lumbar discitis and left [place] joint infection 9/20 (MSSA).   Past Surgical  History: Past Surgical History:  Procedure Laterality Date   AORTIC VALVE REPLACEMENT N/A 09/03/2021   Procedure: AORTIC VALVE REPLACEMENT (AVR) USING INSPIRIS AORTIC VALVE 27 MM;  Surgeon: [name], MD;  Location: [place];  Service: Open Heart Surgery;  Laterality: N/A;   APPENDECTOMY N/A 06/06/2014   Procedure: APPENDECTOMY;  Surgeon: Odis Hollingshead, MD;  Location: [facility];  Service: General;  Laterality: N/A;   CLIPPING OF ATRIAL APPENDAGE  09/14/2021   Procedure: CLIPPING OF ATRIAL APPENDAGE USING ATRICLIP PRO 240;  Surgeon: [name], MD;  Location: [place];  Service: Open Heart Surgery;;   COLON SURGERY     COLONOSCOPY  11/15/2011   Procedure: COLONOSCOPY;  Surgeon: Juanita Craver, MD;  [facility];  Service: Endoscopy;  Laterality: N/A;   COLONOSCOPY WITH PROPOFOL N/A 01/03/2017   Procedure: COLONOSCOPY WITH PROPOFOL;  Surgeon: Carol Ada, MD;  [facility];  Service: Endoscopy;  Laterality: N/A;   COLONOSCOPY WITH PROPOFOL N/A 05/26/2020   Procedure: COLONOSCOPY WITH PROPOFOL;  Surgeon: Carol Ada, MD;  [facility];  Service: Endoscopy;  Laterality: N/A;   cyst on epiglottis  08/2002   ESOPHAGOGASTRODUODENOSCOPY  11/15/2011   Procedure: ESOPHAGOGASTRODUODENOSCOPY (EGD);  Surgeon: Juanita Craver, MD;  [facility];  Service: Endoscopy;  Laterality: N/A;   LAPAROSCOPIC APPENDECTOMY N/A 06/06/2014   Procedure: APPENDECTOMY LAPAROSCOPIC attemted;  Surgeon: Odis Hollingshead, MD;  Location: [facility];  Service: General;  Laterality: N/A;   LUMBAR DISC SURGERY     two  holes in spinalcovering   MAZE N/A 09/25/2021   Procedure: MAZE;  Surgeon: [name], MD;  Location: [place];  Service: Open Heart Surgery;  Laterality: N/A;   POLYPECTOMY  05/26/2020   Procedure: POLYPECTOMY;  Surgeon: Carol Ada, MD;  [facility];  Service: Endoscopy;;   RIGHT/LEFT HEART CATH AND CORONARY ANGIOGRAPHY N/A 07/16/2021   Procedure: RIGHT/LEFT HEART CATH  AND CORONARY ANGIOGRAPHY;  Surgeon: Jettie Booze, MD;  Location: [place] [facility];  Service: Cardiovascular;  Laterality: N/A;   stent     LAD DUS 2004   STERNAL WOUND DEBRIDEMENT Left 08/31/2019   Procedure: LEFT STERNOCLAVICULAR WOUND DEBRIDEMENT WITH APPLICATION OF WOUND VAC;  Surgeon: Lanelle Bal  B, MD;  Location: [facility] [facility];  Service: Thoracic;  Laterality: Left;   surgery l4-l5   1998   ruptured x 3   TEE WITHOUT CARDIOVERSION N/A 07/10/2021   Procedure: TRANSESOPHAGEAL ECHOCARDIOGRAM (TEE);  Surgeon: Lelon Perla, MD;  Location: [facility];  Service: Cardiovascular;  Laterality: N/A;   TEE WITHOUT CARDIOVERSION N/A 09/03/2021   Procedure: TRANSESOPHAGEAL ECHOCARDIOGRAM (TEE);  Surgeon: [name], MD;  Location: [place];  Service: Open Heart Surgery;  Laterality: N/A;   ulnar neuropathy     UMBILICAL HERNIA REPAIR     mesh    Family History: Family History  Problem Relation Age of Onset   Thyroid disease Mother    Ovarian cancer Mother    Breast cancer Mother    Lung cancer Father    Cancer Father     Social History: Social History   Socioeconomic History   Marital status: Married    Spouse name: Not on file   Number of children: 1   Years of education: 15   Highest education level: Associate degree: occupational, Hotel manager, or vocational program  Occupational History   Occupation: retired    Comment: [employer]  Tobacco Use   Smoking status: Former    Packs/day: 2.00    Years: 42.00    Pack years: 84.00    Types: Cigarettes    Quit date: 12/30/2008    Years since quitting: 12.7   Smokeless tobacco: Never   Tobacco comments:    started at age 1; 1-2 ppd; quit 2010  Vaping Use   Vaping Use: Never used  Substance and Sexual Activity   Alcohol use: No    Comment: seldom   Drug use: No   Sexual activity: Yes    Comment: infrequently  Other Topics Concern   Not on file  Social History Narrative   Retired paramedic   Regular  exercise-yes not recently due to SOB   Has one son   Wife is overweight and has fibromyalgia and depression on disability doesn't go out much. Back surgery    Has older dog   Retired from [employer] 31 years of service now 7 years    Caffeine- minimal   Social Determinants of Radio broadcast assistant Strain: Low Risk    Difficulty of Paying Living Expenses: Not hard at all  Food Insecurity: No Food Insecurity   Worried About Charity fundraiser in the Last Year: Never true   Arboriculturist in the Last Year: Never true  Transportation Needs: No Transportation Needs   Lack of Transportation (Medical): No   Lack of Transportation (Non-Medical): No  Physical Activity: Inactive   Days of Exercise per Week: 0 days   Minutes of Exercise per Session: 0 min  Stress: No Stress Concern Present   Feeling of Stress : Not at all  Social Connections: Socially Integrated   Frequency of Communication with Friends and Family: Three times a week   Frequency of Social Gatherings with Friends and Family: Three times a week   Attends Religious Services: More than 4 times per year   Active Member of Clubs or Organizations: Yes   Attends Archivist Meetings: More than 4 times per year   Marital Status: Married    Allergies:  Allergies  Allergen Reactions   Pseudoephedrine Other (See Comments)    Patient went into afib   Diltiazem Rash and Itching    Also 2019   Novocain [Procaine] Hives  Dentist appointment in 1958; since then has tolerated lidocaine and provocaine with no hives or difficulty.   Quinolones     Patient was warned about not using Cipro and similar antibiotics. Recent studies have raised concern that fluoroquinolone antibiotics could be associated with an increased risk of aortic aneurysm Fluoroquinolones have non-antimicrobial properties that might jeopardise the integrity of the extracellular matrix of the vascular wall In a  propensity score matched cohort study in  [place], there was a 66% increased rate of aortic aneurysm or dissection associated with oral fluoroquinolone use, compared wit   Sulfonamide Derivatives Other (See Comments)    Childhood reaction     Objective:    Vital Signs:   Temp:  [97.3 F (36.3 C)-100 F (37.8 C)] 97.9 F (36.6 C) (09/11 0800) Pulse Rate:  [90-92] 90 (09/11 0314) Resp:  [26] 26 (09/11 0800) BP: (83-158)/(57-84) 105/71 (09/11 0800) SpO2:  [94 %-97 %] 97 % (09/11 0817) Arterial Line BP: (108-216)/(34-72) 134/64 (09/11 0804) FiO2 (%):  [50 %-60 %] 50 % (09/11 0817) Weight:  [143 kg] 143 kg (09/11 0100) Last BM Date: 09/05/21  Weight change: Filed Weights   09/13/2021 0801 09/09/21 0100  Weight: 136.1 kg (!) 143 kg    Intake/Output:   Intake/Output Summary (Last 24 hours) at 09/09/2021 0945 Last data filed at 09/09/2021 0700 Gross per 24 hour  Intake 4541.39 ml  Output 1980 ml  Net 2561.39 ml      Physical Exam    General:  Intubated/sedated HEENT: normal Neck: Thick. JVP 12+. Carotids 2+ bilat; no bruits. No lymphadenopathy or thyromegaly appreciated. Cor: PMI nondisplaced. Regular rate & rhythm. No rubs, gallops or murmurs. Lungs: Decreased at bases.  Abdomen: soft, nontender, nondistended. No hepatosplenomegaly. No bruits or masses. Good bowel sounds. Extremities: no cyanosis, clubbing, rash. Trace ankle edema.  Neuro: Sedated.    Telemetry   A-V sequential pacing (personally reviewed)  EKG    ?Junctional rhythm with PVC (9/9), personally reviewed.   Labs   Basic Metabolic Panel: Recent Labs  Lab 09/24/2021 2131 09/07/21 0403 09/07/21 0406 09/07/21 1660 09/07/21 1637 09/08/21 0346 09/08/21 0936 09/09/21 0314 09/09/21 0316  NA 140   < > 139   < > 139 140 142 141 142  K 3.7   < > 3.9   < > 4.1 4.1 4.3 4.0 3.8  CL 108  --  107  --  109 110  --  112*  --   CO2 23  --  24  --  24 23  --  23  --   GLUCOSE 208*  --  162*  --  108* 123*  --  145*  --   BUN 31*  --  33*  --  35* 35*   --  30*  --   CREATININE 1.87*  --  1.98*  --  1.98* 1.84*  --  1.41*  --   CALCIUM 8.4*  --  8.6*  --  8.0* 8.2*  --  8.3*  --   MG 3.0*  --  3.0*  --  2.9*  --   --  2.6*  --   PHOS  --   --   --   --   --   --   --  2.7  --    < > = values in this interval not displayed.    Liver Function Tests: Recent Labs  Lab 09/04/21 1130 09/08/21 0346  AST 26 61*  ALT 32 14  ALKPHOS 50 44  BILITOT 1.5* 1.0  PROT 6.4* 4.9*  ALBUMIN 3.8 3.0*   No results for input(s): LIPASE, AMYLASE in the last 168 hours. No results for input(s): AMMONIA in the last 168 hours.  CBC: Recent Labs  Lab 09/20/2021 2131 09/07/21 0403 09/07/21 0406 09/07/21 0926 09/07/21 1637 09/08/21 0346 09/08/21 0936 09/09/21 0314 09/09/21 0316  WBC 21.1*  --  15.9*  --  15.0* 14.7*  --  11.8*  --   HGB 13.3   < > 12.9*   < > 11.5* 11.1* 11.9* 10.8* 10.5*  HCT 42.2   < > 40.5   < > 36.1* 34.8* 35.0* 33.7* 31.0*  MCV 93.8  --  93.5  --  93.3 93.5  --  94.4  --   PLT 135*  --  118*  --  82* 73*  --  79*  --    < > = values in this interval not displayed.    Cardiac Enzymes: No results for input(s): CKTOTAL, CKMB, CKMBINDEX, TROPONINI in the last 168 hours.  BNP: BNP (last 3 results) No results for input(s): BNP in the last 8760 hours.  ProBNP (last 3 results) No results for input(s): PROBNP in the last 8760 hours.   CBG: Recent Labs  Lab 09/08/21 1140 09/08/21 1543 09/08/21 1947 09/08/21 2353 09/09/21 0800  GLUCAP 136* 142* 121* 115* 113*    Coagulation Studies: Recent Labs    09/05/2021 1735  LABPROT 18.6*  INR 1.6*     Imaging   DG CHEST PORT 1 VIEW  Result Date: 09/09/2021 CLINICAL DATA:  Assess chest tube. EXAM: PORTABLE CHEST 1 VIEW COMPARISON:  September 08, 2021 FINDINGS: Mediastinal contour and cardiac silhouette are stable. Heart size is enlarged. Endotracheal tube is unchanged. Swan-Ganz catheter is unchanged. Mediastinal drain is unchanged. Mild diffuse increased pulmonary  interstitium is identified bilaterally. There is minimal left pleural effusion. The bony structures are stable. IMPRESSION: Mild congestive heart failure.  Minimal left pleural effusion. Life supporting devices are unchanged. Electronically Signed   By: Abelardo Diesel M.D.   On: 09/09/2021 08:40     Medications:     Current Medications:  acetaminophen  1,000 mg Oral Q6H   Or   acetaminophen (TYLENOL) oral liquid 160 mg/5 mL  1,000 mg Per Tube Q6H   arformoterol  15 mcg Nebulization BID   aspirin EC  325 mg Oral Daily   Or   aspirin  324 mg Per Tube Daily   atorvastatin  80 mg Per Tube Daily   bisacodyl  10 mg Oral Daily   Or   bisacodyl  10 mg Rectal Daily   brimonidine  1 drop Both Eyes TID   busPIRone  15 mg Per Tube BID   chlorhexidine gluconate (MEDLINE KIT)  15 mL Mouth Rinse BID   Chlorhexidine Gluconate Cloth  6 each Topical Daily   clonazePAM  1 mg Per Tube BID   docusate  200 mg Per Tube Daily   feeding supplement (PROSource TF)  90 mL Per Tube TID   fentaNYL (SUBLIMAZE) injection  25 mcg Intravenous Once   fluticasone  2 spray Each Nare Daily   insulin aspart  0-20 Units Subcutaneous Q4H   insulin detemir  30 Units Subcutaneous BID   mouth rinse  15 mL Mouth Rinse 10 times per day   pantoprazole sodium  40 mg Per Tube Daily   PARoxetine  60 mg Per Tube Daily   polyethylene glycol  17 g Per  Tube Daily   revefenacin  175 mcg Nebulization Daily   senna  1 tablet Per Tube Daily   sodium chloride flush  3 mL Intravenous Q12H    Infusions:  sodium chloride 20 mL/hr at 09/09/21 0700   sodium chloride     sodium chloride     ceFEPime (MAXIPIME) IV 2 g (09/09/21 0920)   dexmedetomidine (PRECEDEX) IV infusion 0.7 mcg/kg/hr (09/09/21 0700)   epinephrine 8 mcg/min (09/09/21 0700)   feeding supplement (VITAL 1.5 CAL) 20 mL/hr at 09/09/21 0700   fentaNYL infusion INTRAVENOUS 300 mcg/hr (09/09/21 0700)   insulin Stopped (09/08/21 1149)   lactated ringers     lactated  ringers 20 mL/hr at 09/09/21 0700   midazolam 1 mg/hr (09/09/21 0700)   milrinone 0.375 mcg/kg/min (09/09/21 0700)   nitroGLYCERIN     norepinephrine (LEVOPHED) Adult infusion 4 mcg/min (09/09/21 0700)   vancomycin Stopped (09/08/21 1315)     Assessment/Plan   1. Aortic insufficiency: TEE pre-op with severe AI, tricuspid aortic valve.  The LV was moderately dilated.  Now s/p bioprosthetic AVR.  The native valve was perforated with possible vegetation.  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  He had MSSA discitis with prolonged course back in 9/20.  Uncertain whether valve changes were chronic or more acute/subacute.  Valve tissue cultures NGTD so far.  2. Shock: Post-op.  Suspect mixed picture with RV dysfunction/severe pulmonary hypertension as well as possible distributive component.  Currently afebrile.  Titrating down pressors.  He remains on iNO 30 ppm, epinephrine 8, NE 4, milrinone 0.375.  - Wean NE first, then slowly epinephrine.  - Would not change milrinone and iNO.  - Covering for possible sepsis picture with cefepime/vancomycin.  3.  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  Post-op volume overload, CVP 15 today.  - I will get repeat echo post-op to follow RV and LV function.  - Start Lasix diuresis today, 40 mg IV bid and follow response.  - BMET in pm.  4.  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  Has significant lung parenchymal disease, CTA chest in 6/22 with emphysema as well as areas of fibrosis, as well as OSA and ?OHS.  Pre-op was on CPAP and 4L home oxygen.  Initially, peri-operative PA pressure was very high, in the 90/45 range.  This has improved.  Today, 39/28.  - Continue iNO 30 ppm for now, would not wean yet.  - Continue milrinone 0.375, would not wean.  - Wean NE and epinephrine  gradually, when these meds have been weaned down, would start sildenafil.  5. Acute on chronic hypoxemic respiratory failure: Intubated post-op.  Baseline COPD + OSA + ?OHS and on home oxygen. Now with post-op volume overload as well.  - Vent wean per CCM.  6. ID: Cultures including tissue culture from valve so far negative.  9/20 MSSA discitis.  Concern for possible distributive shock component.  - He is on vancomcycin/cefepime.  7. AKI on CKD stage 3: Creatinine lower today at 1.4.  - Follow with diuresis.  8. Atrial fibrillation: Paroxysmal.  On Tikosyn pre-op.  Had Maze procedure, now A-V sequentially paced.  - Will need eventual anticoagulation, timing per surgery.    Length of Stay: 3  Loralie Champagne, MD  09/09/2021, 9:45 AM  Advanced Heart Failure Team Pager [phone] (M-F; 7a - 5p)  Please contact [facility] for night-coverage after hours (4p -7a ) and weekends on [email or web address]

## 2021-09-09 NOTE — Progress Notes (Signed)
3 Days Post-Op Procedure(s) (LRB): AORTIC VALVE REPLACEMENT (AVR) USING INSPIRIS AORTIC VALVE 27 MM (N/A) MAZE (N/A) TRANSESOPHAGEAL ECHOCARDIOGRAM (TEE) (N/A) APPLICATION OF CELL SAVER CLIPPING OF ATRIAL APPENDAGE USING ATRICLIP PRO 240 Subjective: Remains sedated on vent.  Milrinone 0.375, epi 8, NE down to 4 this am and back up to 11 this afternoon after diuresis. Co-ox 78 this morning.  CI 3.5 this afternoon. PAP 40/28, CVP 10  Objective: Vital signs in last 24 hours: Temp:  [97.3 F (36.3 C)-100 F (37.8 C)] 98.1 F (36.7 C) (09/11 1500) Pulse Rate:  [90] 90 (09/11 1541) Cardiac Rhythm: A-V Sequential paced (09/11 0800) Resp:  [26] 26 (09/11 1541) BP: (83-151)/(57-84) 92/61 (09/11 1541) SpO2:  [94 %-97 %] 97 % (09/11 1541) Arterial Line BP: (110-216)/(34-72) 110/52 (09/11 1500) FiO2 (%):  [50 %-60 %] 50 % (09/11 1541) Weight:  [143 kg] 143 kg (09/11 0100)  Hemodynamic parameters for last 24 hours: PAP: (37-54)/(25-32) 40/28 CVP:  [9 mmHg-13 mmHg] 11 mmHg CO:  [7 L/min-9.9 L/min] 7 L/min CI:  [2.7 L/min/m2-3.9 L/min/m2] 2.7 L/min/m2  Intake/Output from previous day: 09/10 0701 - 09/11 0700 In: 4541.4 [I.V.:3691.4; NG/GT:370; IV Piggyback:450] Out: 2325 [Urine:1505; Emesis/NG output:710; Chest Tube:110] Intake/Output this shift: No intake/output data recorded.  General appearance: sedated on vent Neurologic: unable to assess due to sedation Heart: regular rate and rhythm, S1, S2 normal, no murmur Lungs: clear to auscultation bilaterally Abdomen: soft, non-tender; bowel sounds hypoactive Extremities: moderate anasarca Wound: dressing dry  Lab Results: Recent Labs    09/08/21 0346 09/08/21 0936 09/09/21 0314 09/09/21 0316  WBC 14.7*  --  11.8*  --   HGB 11.1*   < > 10.8* 10.5*  HCT 34.8*   < > 33.7* 31.0*  PLT 73*  --  79*  --    < > = values in this interval not displayed.   BMET:  Recent Labs    09/09/21 0314 09/09/21 0316 09/09/21 1633  NA 141  142 140  K 4.0 3.8 3.9  CL 112*  --  110  CO2 23  --  23  GLUCOSE 145*  --  175*  BUN 30*  --  32*  CREATININE 1.41*  --  1.48*  CALCIUM 8.3*  --  8.2*    PT/INR: No results for input(s): LABPROT, INR in the last 72 hours. ABG    Component Value Date/Time   PHART 7.413 09/09/2021 0316   HCO3 23.2 09/09/2021 0316   TCO2 24 09/09/2021 0316   ACIDBASEDEF 1.0 09/09/2021 0316   O2SAT 77.7 09/09/2021 0320   CBG (last 3)  Recent Labs    09/09/21 0800 09/09/21 1147 09/09/21 1625  GLUCAP 113* 140* 176*   CXR: pulmonary edema, small left pleural effusion.  Assessment/Plan: S/P Procedure(s) (LRB): AORTIC VALVE REPLACEMENT (AVR) USING INSPIRIS AORTIC VALVE 27 MM (N/A) MAZE (N/A) TRANSESOPHAGEAL ECHOCARDIOGRAM (TEE) (N/A) APPLICATION OF CELL SAVER CLIPPING OF ATRIAL APPENDAGE USING ATRICLIP PRO 240  POD 3 He has been more stable the past 24 hrs but with diuresis today BP has decreased requiring more NE.  CO and Co-ox good. Continue milrinone and epi at current levels for RV support with severe pulmonary HTN. Wean NE as BP allows.  Continue NO.  He has severe fixed pulmonary hypertension. Dr. Aundra Dubin consulting from heart failure.   Underlying pulmonary disease with long smoking hx, resting hypoxemia preop and cobblestone appearance of lungs in OR. PFT's showed mild obstructive defect and severe diffusion defect at 38% of predicted. He  had PA pressure of 90/45 preop. PA pressure did not decrease much with correction of his severe AI. Continue ventilator support per CCM. FiO2 down to 50%.    Stage 3 CKD with baseline creat about 1.5 with postop acute kidney injury due to hypotension. Creat stable and down today to 1.41.  Increased slightly tonight to 1.48 with diuresis.   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 Operative GS showed WBC but no organisms. Culture negative so far.  Continue antibiotics for now.   Morbid obesity.   DM: glucose under adequate  control on Levemir and SSI.   Nutrition: tube feeds started yesterday but held due to high residual.    LOS: 3 days    [name] 09/09/2021

## 2021-09-09 NOTE — Progress Notes (Signed)
NAME:  [name] [name]., MRN:  [id number], DOB:  1951/07/13, LOS: 3 ADMISSION DATE:  09/11/2021, CONSULTATION DATE:  09/07/21 REFERRING MD:  Dr. [name], CHIEF COMPLAINT:  Resp failure   History of Present Illness:  HPI obtained from medical chart review as patient is intubated and sedated on mechanical ventilation.   70 year old male with prior history of prior tobacco abuse (quit 2010), centrilobular emphysema, chronic hypoxic respiratory failure on home O2 (?4L ), pulmonary hypertension, OSA on CPAP, PAF, CAD w/ LAD stent 2003, HLD, MSSA sepsis c/b lumbar discitis and infected left sternoclavicular joint 08/2019 who recently had worsening exertional dyspnea, orthopnea, and fatigue.  Had TTE in 04/2021 which showed new moderate to severe AI with normal EF 55-60 and new dilation of LV cavity with trivial MR and mild TR, normal RV.  Subsequently, underwent TEE on 07/10/21 which shoed EF 55-60%, mild dilation of ascending aorta, and severe AI.  Underwent [facility]  07/16/21 which showed patent LAD stent, mild diffuse nonobstructive CAD, moderate pulmonary hypertension with PA 67/20 (39) and a pulmonary wedge pressure of 62mHg.  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  Found to have AV vegetations which where sent for culture.  EBL during surgery 3025 ml with 1870 ml in blood products/ cell saver.  Patient did have some trouble transitioning off CPB with high PA pressures with struggling systemic pressures and RV, therefore ended up on NO and vasopressor support with milrinone.  He remains sedated and intubated with tenuous hemodynamics today.  PCCM consulted to help with vent management.  HF also being consulted to help with pulmonary hypertension.    Of note, patient is followed in our pulmonary office by Dr. SHalford Chessman last seen 08/10/21 by BDerl Barrow NP.  Previous CTA 6/22 neg for PE, noted underlying emphysematous changes with areas  of fibrosis and scarring, nodular opacity in RML (smaller now than previous imaging).  Reported compliance with CPAP and anoro.  PFT 08/09/21- FVC 3.42 (75%), FEV1 2. 60 (77%), ratio 76, DLCOunc 10/29 (38%).  Uses O2 2L at rest, and 3-4L with exertions for goal sats > 88-90%.  Pertinent  Medical History  Former Tobacco abuse (quit 2010), centrilobular emphysema, pulmonary hypertension, OSA on CPAP, PAF, CAD w/ LAD stent 2003, HLD, MSSA sepsis c/b lumbar discitis and infected left sternoclavicular joint 08/2019   Significant Hospital Events: Including procedures, antibiotic start and stop dates in addition to other pertinent events   9/8 aortic valve replacement with bioprosthetic valve and biatrial Maze procedure  9/8 ETT >> 9/8 foley >> 9/8 R IJ cordis with PA cath >> 9/8 L radial Aline >> 9/8 L femoral aline (placed in OR given variable L radial tracings)>> 9/8 parasternal pacing wires >> 9/8 mediastinal CT x 2 >>  9/8 aortic tissue cx >> 9/8 aortic tissue AFB >> 9/8 aortic fungal >> 9/9 Bcx 2 >>  9/8 ancef >>9/9 9/8 vanc >> 9/9 cefepime >>  Interim History / Subjective:  Started on versed infusion last night for agitation. Does better with infusion rather than IVP due to hypotension. Remains on fentanyl and precedex for sedation. On milrinone, low dose epi and NE. High TF residuals overnight. Tmax 100. CVP 11 PA 38/29 ( 33)  Objective   Blood pressure (!) 83/74, pulse 90, temperature 97.7 F (36.5 C), resp. rate (!) 26, height '6\' 1"'$  (1.854 m), weight (!) 143 kg, SpO2 96 %. PAP: (38-80)/(25-42) 38/28 CVP:  [  9 mmHg-22 mmHg] 11 mmHg CO:  [9.1 L/min-11.1 L/min] 9.1 L/min CI:  [3.6 L/min/m2-4.3 L/min/m2] 3.6 L/min/m2  Vent Mode: PRVC FiO2 (%):  [50 %-70 %] 50 % Set Rate:  [22 bmp-26 bmp] 26 bmp Vt Set:  [630 mL] 630 mL PEEP:  [14 [name]20] 14 [name]20 Plateau Pressure:  [20 [name]20-28 [name]20] 27 [name]20   Intake/Output Summary (Last 24 hours) at 09/09/2021 0719 Last data filed at  09/09/2021 0700 Gross per 24 hour  Intake 4541.39 ml  Output 2325 ml  Net 2216.39 ml    Filed Weights   09/05/2021 0801 09/09/21 0100  Weight: 136.1 kg (!) 143 kg    Examination: General:  critically ill appearing man lying in bed in NAD HEENT: [place]/AT, eyes anicteric Neuro:  RASS -5, no cough reflex. CV: Dual paced rhythms, intermittently narrow complex. Distant heart sounds. Minimal serosanguinous drainage from mediastinal drains. PULM:  No significant ETT secretions. Diminished breath sounds bilaterally. GI: obese, soft, NT Extremities: edema in all extremities.   Skin: warm, dry, no rashes.  Coox 77.7% BUN 30 Cr 1.41 WBC 11.8 H/H 10.8/33.7 Platelets 79 Valve cultures: predominantly monocytes, no organisms seen Fungal-pending AFB-pending 9/9 blood cultures> NGTD  CXR personally reviewed>cardiomegaly, pulmonary edema  Resolved Hospital Problem list    Assessment & Plan:   Acute on chronic hypoxic respiratory failure (on home 4L O2 at home) OSA, on home CPAP-- not compliant when sleeping during the day at home Emphysema/ COPD Former tobacco abuse  -LTVV, 4-8cc/kg IBW with goal Pplat<30 and DP<15 -PAD protocol for sedation. If requiring versed, discontinue precedex. -VAP prevention protocol - Wean FiO2 before PEEP or iNO - Not stable enough yet for SBT  - con't nebulized bronchodilators- brovana and yupelri.  On Anoro PTA. 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 Mononuclear cells on path suggests a more chronic process.  -follow cultures until finalized - Con't broad spectrum antibiotics until hemodynamically improved and additional culture data available - trend WBC/ fever curve   Cardiogenic shock due to decompensated RV failure. Baseline PA pressure 67/20, mean PAP 39 from [place] 06/2021. (Calculated PVR 2.18) Pulmonary hypertension causing acute RV failure OSA-- concern that this may be undertreated despite compliance  with CPAP -Con't milrinone, norepi, epinephrine. -start diuresis today -Con't iNO 30ppm. Wean FiO2 before PEEP and iNO. -Anticipate he may required sildenafil for PH. -Maintain adequate preload; ok to start diuresis today with improving hemodynamics. -Con't PA pressure monitoring.  AI s/p AVR and MAZE procedure Hx PAF, CAD with LAD stent Shock- likely multifactorial - cardiogenic and possibly septic, improving. - post-op management per TCTS - con't pacing DDD - Cont' inotropic support. - Con't invasive hemodynamic monitoring with Swan.  - ASA, lipitor daily  Concern for ileus -con't dulcolax -adding senna and miralax -keep TF rate low until bowel function improves  AKI on chronic CKD stage 3B, likely r/t hypoperfusion. AKI resolved. - Con't to maintain adequate BP and CO - Strict I/Os, con't foley catheter. - con't daily monitoring - Renally dose medications and avoid nephrotoxic meds.  Mild acute anemia, likely due to operative blood loss-we will - Transfuse for hemoglobin less than 7 or hemodynamically significant bleeding. -Continue daily monitoring.  Acute thrombocytopenia, likely consumptive related to surgery and bypass, improving - Continue to monitor  DM with hyperglycemia; A1c 6.5 -Continue Levemir 30 twice daily; anticipate this may increase or he may require tube feeding coverage as tube feed tolerance increases - Sliding scale insulin as needed -  Goal BG <180  Morbid obesity, BMI 39.5 kg/m2 -Long-term recommend moderate weight loss  Best Practice (right click and "Reselect all SmartList Selections" daily)   Diet/type: tubefeeds DVT prophylaxis: SCD, defer to TCTS GI prophylaxis: PPI Lines: yes and it is still needed Foley:  Yes, and it is still needed Code Status:  full code Last date of multidisciplinary goals of care discussion [wife updated at bedside 9/ 11]  Labs   CBC: Recent Labs  Lab 09/15/2021 2131 09/07/21 0403 09/07/21 0406 09/07/21 0926  09/07/21 1637 09/08/21 0346 09/08/21 0936 09/09/21 0314 09/09/21 0316  WBC 21.1*  --  15.9*  --  15.0* 14.7*  --  11.8*  --   HGB 13.3   < > 12.9*   < > 11.5* 11.1* 11.9* 10.8* 10.5*  HCT 42.2   < > 40.5   < > 36.1* 34.8* 35.0* 33.7* 31.0*  MCV 93.8  --  93.5  --  93.3 93.5  --  94.4  --   PLT 135*  --  118*  --  82* 73*  --  79*  --    < > = values in this interval not displayed.     Basic Metabolic Panel: Recent Labs  Lab 09/02/2021 2131 09/07/21 0403 09/07/21 0406 09/07/21 0926 09/07/21 1637 09/08/21 0346 09/08/21 0936 09/09/21 0314 09/09/21 0316  NA 140   < > 139   < > 139 140 142 141 142  K 3.7   < > 3.9   < > 4.1 4.1 4.3 4.0 3.8  CL 108  --  107  --  109 110  --  112*  --   CO2 23  --  24  --  24 23  --  23  --   GLUCOSE 208*  --  162*  --  108* 123*  --  145*  --   BUN 31*  --  33*  --  35* 35*  --  30*  --   CREATININE 1.87*  --  1.98*  --  1.98* 1.84*  --  1.41*  --   CALCIUM 8.4*  --  8.6*  --  8.0* 8.2*  --  8.3*  --   MG 3.0*  --  3.0*  --  2.9*  --   --  2.6*  --   PHOS  --   --   --   --   --   --   --  2.7  --    < > = values in this interval not displayed.    GFR: Estimated Creatinine Clearance: 71.4 mL/min (A) (by C-G formula based on SCr of 1.41 mg/dL (H)). Recent Labs  Lab 09/07/21 0406 09/07/21 1637 09/08/21 0346 09/09/21 0314  WBC 15.9* 15.0* 14.7* 11.8*     Liver Function Tests: Recent Labs  Lab 09/04/21 1130 09/08/21 0346  AST 26 61*  ALT 32 14  ALKPHOS 50 44  BILITOT 1.5* 1.0  PROT 6.4* 4.9*  ALBUMIN 3.8 3.0*    No results for input(s): LIPASE, AMYLASE in the last 168 hours. No results for input(s): AMMONIA in the last 168 hours.  ABG    Component Value Date/Time   PHART 7.413 09/09/2021 0316   PCO2ART 36.2 09/09/2021 0316   PO2ART 102 09/09/2021 0316   HCO3 23.2 09/09/2021 0316   TCO2 24 09/09/2021 0316   ACIDBASEDEF 1.0 09/09/2021 0316   O2SAT 77.7 09/09/2021 0320      This patient is critically ill with  multiple  organ system failure which requires frequent high complexity decision making, assessment, support, evaluation, and titration of therapies. This was completed through the application of advanced monitoring technologies and extensive interpretation of multiple databases. During this encounter critical care time was devoted to patient care services described in this note for 46 minutes.  Julian Hy, DO 09/09/21 8:22 AM  Pulmonary & Critical Care

## 2021-09-09 NOTE — Progress Notes (Signed)
  Echocardiogram 2D Echocardiogram has been performed.  [name] [name] 09/09/2021, 4:21 PM

## 2021-09-10 ENCOUNTER — Inpatient Hospital Stay (HOSPITAL_COMMUNITY): Payer: Medicare Other

## 2021-09-10 ENCOUNTER — Encounter (HOSPITAL_COMMUNITY): Payer: Self-pay | Admitting: Surgery

## 2021-09-10 DIAGNOSIS — I351 Nonrheumatic aortic (valve) insufficiency: Secondary | ICD-10-CM

## 2021-09-10 DIAGNOSIS — R570 Cardiogenic shock: Secondary | ICD-10-CM | POA: Diagnosis not present

## 2021-09-10 DIAGNOSIS — Z952 Presence of prosthetic heart valve: Secondary | ICD-10-CM

## 2021-09-10 LAB — COOXEMETRY PANEL
Carboxyhemoglobin: 1.5 % (ref 0.5–1.5)
Methemoglobin: 1.4 % (ref 0.0–1.5)
O2 Saturation: 73.5 %
Total hemoglobin: 10.9 g/dL — ABNORMAL LOW (ref 12.0–16.0)

## 2021-09-10 LAB — BASIC METABOLIC PANEL
Anion gap: 7 (ref 5–15)
Anion gap: 8 (ref 5–15)
BUN: 36 mg/dL — ABNORMAL HIGH (ref 8–23)
BUN: 39 mg/dL — ABNORMAL HIGH (ref 8–23)
CO2: 22 mmol/L (ref 22–32)
CO2: 22 mmol/L (ref 22–32)
Calcium: 8.7 mg/dL — ABNORMAL LOW (ref 8.9–10.3)
Calcium: 8.7 mg/dL — ABNORMAL LOW (ref 8.9–10.3)
Chloride: 112 mmol/L — ABNORMAL HIGH (ref 98–111)
Chloride: 110 mmol/L (ref 98–111)
Creatinine, Ser: 1.58 mg/dL — ABNORMAL HIGH (ref 0.61–1.24)
Creatinine, Ser: 1.59 mg/dL — ABNORMAL HIGH (ref 0.61–1.24)
GFR, Estimated: 46 mL/min — ABNORMAL LOW (ref >60)
GFR, Estimated: 46 mL/min — ABNORMAL LOW (ref >60)
Glucose, Bld: 145 mg/dL — ABNORMAL HIGH (ref 70–99)
Glucose, Bld: 123 mg/dL — ABNORMAL HIGH (ref 70–99)
Potassium: 4.3 mmol/L (ref 3.5–5.1)
Potassium: 4.3 mmol/L (ref 3.5–5.1)
Sodium: 141 mmol/L (ref 135–145)
Sodium: 140 mmol/L (ref 135–145)

## 2021-09-10 LAB — POCT I-STAT 7, (LYTES, BLD GAS, ICA,H+H)
Acid-base deficit: 4 mmol/L — ABNORMAL HIGH (ref 0.0–2.0)
Bicarbonate: 22.4 mmol/L (ref 20.0–28.0)
Calcium, Ion: 1.25 mmol/L (ref 1.15–1.40)
HCT: 31 % — ABNORMAL LOW (ref 39.0–52.0)
Hemoglobin: 10.5 g/dL — ABNORMAL LOW (ref 13.0–17.0)
O2 Saturation: 95 %
Patient temperature: 37.3
Potassium: 4.1 mmol/L (ref 3.5–5.1)
Sodium: 144 mmol/L (ref 135–145)
TCO2: 24 mmol/L (ref 22–32)
pCO2 arterial: 46.7 mmHg (ref 32.0–48.0)
pH, Arterial: 7.29 — ABNORMAL LOW (ref 7.350–7.450)
pO2, Arterial: 85 mmHg (ref 83.0–108.0)

## 2021-09-10 LAB — CBC
HCT: 35.1 % — ABNORMAL LOW (ref 39.0–52.0)
Hemoglobin: 10.7 g/dL — ABNORMAL LOW (ref 13.0–17.0)
MCH: 29.2 pg (ref 26.0–34.0)
MCHC: 30.5 g/dL (ref 30.0–36.0)
MCV: 95.6 fL (ref 80.0–100.0)
Platelets: 84 10*3/uL — ABNORMAL LOW (ref 150–400)
RBC: 3.67 MIL/uL — ABNORMAL LOW (ref 4.22–5.81)
RDW: 16 % — ABNORMAL HIGH (ref 11.5–15.5)
WBC: 10.7 10*3/uL — ABNORMAL HIGH (ref 4.0–10.5)
nRBC: 0 % (ref 0.0–0.2)

## 2021-09-10 LAB — VANCOMYCIN, PEAK: Vancomycin Pk: 33 ug/mL (ref 30–40)

## 2021-09-10 LAB — GLUCOSE, CAPILLARY
Glucose-Capillary: 120 mg/dL — ABNORMAL HIGH (ref 70–99)
Glucose-Capillary: 122 mg/dL — ABNORMAL HIGH (ref 70–99)
Glucose-Capillary: 128 mg/dL — ABNORMAL HIGH (ref 70–99)
Glucose-Capillary: 128 mg/dL — ABNORMAL HIGH (ref 70–99)
Glucose-Capillary: 148 mg/dL — ABNORMAL HIGH (ref 70–99)
Glucose-Capillary: 93 mg/dL (ref 70–99)

## 2021-09-10 LAB — C-REACTIVE PROTEIN: CRP: 2.6 mg/dL — ABNORMAL HIGH (ref <1.0)

## 2021-09-10 LAB — MAGNESIUM: Magnesium: 2.3 mg/dL (ref 1.7–2.4)

## 2021-09-10 LAB — PHOSPHORUS: Phosphorus: 4.9 mg/dL — ABNORMAL HIGH (ref 2.5–4.6)

## 2021-09-10 LAB — SEDIMENTATION RATE: Sed Rate: 10 mm/hr (ref 0–16)

## 2021-09-10 LAB — SURGICAL PATHOLOGY

## 2021-09-10 MED ORDER — POTASSIUM CHLORIDE 20 MEQ PO PACK: 20.0000 meq | PACK | Freq: Once | ORAL | Status: DC

## 2021-09-10 MED ORDER — SODIUM CHLORIDE 0.9 % IV SOLN
2.0000 g | INTRAVENOUS | Status: DC
  Administered 2021-09-10 – 2021-09-11 (×2): 2 g via INTRAVENOUS
  Filled 2021-09-10 (×2): qty 20

## 2021-09-10 MED ORDER — VITAL 1.5 CAL PO LIQD
1000.0000 mL | ORAL | Status: DC
  Administered 2021-09-10 – 2021-09-11 (×2): 1000 mL

## 2021-09-10 MED ORDER — POTASSIUM CHLORIDE 20 MEQ PO PACK
20.0000 meq | PACK | Freq: Once | ORAL | Status: AC
  Administered 2021-09-10: 20 meq
  Filled 2021-09-10: qty 1

## 2021-09-10 MED ORDER — METOCLOPRAMIDE HCL 5 MG/ML IJ SOLN
5.0000 mg | Freq: Once | INTRAMUSCULAR | Status: AC
  Administered 2021-09-10: 5 mg via INTRAVENOUS
  Filled 2021-09-10: qty 2

## 2021-09-10 MED ORDER — FUROSEMIDE 10 MG/ML IJ SOLN
40.0000 mg | Freq: Two times a day (BID) | INTRAMUSCULAR | Status: DC
  Administered 2021-09-10 – 2021-09-13 (×8): 40 mg via INTRAVENOUS
  Filled 2021-09-10 (×8): qty 4

## 2021-09-10 MED ORDER — METOCLOPRAMIDE HCL 5 MG/ML IJ SOLN
10.0000 mg | Freq: Once | INTRAMUSCULAR | Status: DC
  Filled 2021-09-10: qty 2

## 2021-09-10 MED FILL — Thrombin (Recombinant) For Soln 20000 Unit: CUTANEOUS | Qty: 1 | Status: CN

## 2021-09-10 NOTE — Progress Notes (Signed)
[place] Progress Note Patient Name: [name] [name]. DOB: 1951-08-04 MRN: [id number]   Date of Service  09/10/2021  HPI/Events of Note  ABG reviewed.  eICU Interventions  Respiratory rate increased to 30.        [name] 09/10/2021, 4:18 AM

## 2021-09-10 NOTE — Progress Notes (Signed)
4 Days Post-Op Procedure(s) (LRB): AORTIC VALVE REPLACEMENT (AVR) USING INSPIRIS AORTIC VALVE 27 MM (N/A) MAZE (N/A) TRANSESOPHAGEAL ECHOCARDIOGRAM (TEE) (N/A) APPLICATION OF CELL SAVER CLIPPING OF ATRIAL APPENDAGE USING ATRICLIP PRO 240 Subjective: Intubated and sedated on vent  Hemodynamics stable overnight. Milrinone 0.375, epi 6, NE 10, NO 30 ppm CI 4, Co-ox 73.5  +365 cc /24 hrs. Wt recorded up 7 lbs from yesterday. 22 lbs over preop.  Objective: Vital signs in last 24 hours: Temp:  [97.9 F (36.6 C)-99.1 F (37.3 C)] 99.1 F (37.3 C) (09/12 0900) Pulse Rate:  [81-100] 100 (09/12 0326) Cardiac Rhythm: Atrial paced (09/12 0600) Resp:  [20-30] 22 (09/12 0900) BP: (83-164)/(51-101) 132/82 (09/12 0900) SpO2:  [90 %-98 %] 94 % (09/12 0900) Arterial Line BP: (88-233)/(43-96) 146/66 (09/12 0900) FiO2 (%):  [40 %-50 %] 40 % (09/12 0800) Weight:  [146.1 kg] 146.1 kg (09/12 0456)  Hemodynamic parameters for last 24 hours: PAP: (36-72)/(21-42) 54/29 CVP:  [10 mmHg-15 mmHg] 13 mmHg PCWP:  [24 mmHg-32 mmHg] 24 mmHg CO:  [10.8 L/min-11.6 L/min] 11.6 L/min CI:  [4.2 L/min/m2-4.5 L/min/m2] 4.5 L/min/m2  Intake/Output from previous day: 09/11 0701 - 09/12 0700 In: 3985.7 [I.V.:2625.7; NG/GT:890; IV Piggyback:470] Out: [id number] [Urine:3570; Chest Tube:50] Intake/Output this shift: Total I/O In: -  Out: 555 [Urine:525; Chest Tube:30]  General appearance: sedated on vent and more calm, follows commands Neurologic: intact Heart: regular rate and rhythm, S1, S2 normal, no murmur Lungs: clear to auscultation bilaterally Abdomen: soft, non-tender; bowel sounds hypoactive Extremities: anasarca Wound: chest incision healing well.  Lab Results: Recent Labs    09/09/21 0314 09/09/21 0316 09/10/21 0334 09/10/21 0600  WBC 11.8*  --   --  10.7*  HGB 10.8*   < > 10.5* 10.7*  HCT 33.7*   < > 31.0* 35.1*  PLT 79*  --   --  84*   < > = values in this interval not displayed.   BMET:   Recent Labs    09/09/21 1633 09/10/21 0334 09/10/21 0600  NA 140 144 141  K 3.9 4.1 4.3  CL 110  --  112*  CO2 23  --  22  GLUCOSE 175*  --  145*  BUN 32*  --  36*  CREATININE 1.48*  --  1.58*  CALCIUM 8.2*  --  8.7*    PT/INR: No results for input(s): LABPROT, INR in the last 72 hours. ABG    Component Value Date/Time   PHART 7.290 (L) 09/10/2021 0334   HCO3 22.4 09/10/2021 0334   TCO2 24 09/10/2021 0334   ACIDBASEDEF 4.0 (H) 09/10/2021 0334   O2SAT 73.5 09/10/2021 0337   CBG (last 3)  Recent Labs    09/10/21 0004 09/10/21 0333 09/10/21 0736  GLUCAP 120* 148* 128*    Assessment/Plan: S/P Procedure(s) (LRB): AORTIC VALVE REPLACEMENT (AVR) USING INSPIRIS AORTIC VALVE 27 MM (N/A) MAZE (N/A) TRANSESOPHAGEAL ECHOCARDIOGRAM (TEE) (N/A) APPLICATION OF CELL SAVER CLIPPING OF ATRIAL APPENDAGE USING ATRICLIP PRO 240  POD 4  Hemodynamics more stable and weaning NE, epi. Continue milrinone, NO. Wean epi slowly with severe pulmonary HTN and RV dysfunction. Severe pulmonary HTN preop.  VDRF due to underlying pulmonary disease with long smoking hx, resting hypoxemia preop and cobblestone appearance of lungs in OR. PFT's showed mild obstructive defect and severe diffusion defect at 38% of predicted. He had PA pressure of 90/45 preop. PA pressure did not decrease much with correction of his severe AI. Continue ventilator support per CCM. FiO2 down  to 40%.  Stage 3 CKD with baseline creat about 1.5 with postop acute kidney injury due to hypotension. Creat fairly stable. Continue diuresis.  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 Operative GS showed WBC but no organisms. Culture negative so far.  Continue antibiotics for now.   Morbid obesity.   DM: glucose under adequate control on Levemir and SSI.   Nutrition: tube feeds resumed at 20 yesterday and residual low. Abdomne is not distending. Few BS. Will increase to 30 cc/hr.  Discussed status  with wife at bedside. Overall I think he is improving.  LOS: 4 days    [name] 09/10/2021

## 2021-09-10 NOTE — Progress Notes (Signed)
Peep weaned to 17 from 83 with fio2 40% and sats 97% Will cont to wean as tolerated, should help with vasopressor wean as well from BP standpoint.

## 2021-09-10 NOTE — Progress Notes (Signed)
Second set Purple and Green tube of blood sent to lab at this time for ordered labs.   Per lab First set of labs were sent at same time of ordered co-ox and specimens lost. Co-ox resulted.

## 2021-09-10 NOTE — Progress Notes (Signed)
NAME:  [name] [name]., MRN:  [id number], DOB:  [birth year], LOS: 4 ADMISSION DATE:  09/11/2021, CONSULTATION DATE:  09/07/21 REFERRING MD:  Dr. [name], CHIEF COMPLAINT:  Resp failure   History of Present Illness:  HPI obtained from medical chart review as patient is intubated and sedated on mechanical ventilation.   70 year old male with prior history of prior tobacco abuse (quit 2010), centrilobular emphysema, chronic hypoxic respiratory failure on home O2 (?4L [place]), pulmonary hypertension, OSA on CPAP, PAF, CAD w/ LAD stent 2003, HLD, MSSA sepsis c/b lumbar discitis and infected left sternoclavicular joint 08/2019 who recently had worsening exertional dyspnea, orthopnea, and fatigue.  Had TTE in 04/2021 which showed new moderate to severe AI with normal EF 55-60 and new dilation of LV cavity with trivial MR and mild TR, normal RV.  Subsequently, underwent TEE on 07/10/21 which shoed EF 55-60%, mild dilation of ascending aorta, and severe AI.  Underwent [facility]  07/16/21 which showed patent LAD stent, mild diffuse nonobstructive CAD, moderate pulmonary hypertension with PA 67/20 (39) and a pulmonary wedge pressure of 25mHg.  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  Found to have AV vegetations which where sent for culture.  EBL during surgery 3025 ml with 1870 ml in blood products/ cell saver.  Patient did have some trouble transitioning off CPB with high PA pressures with struggling systemic pressures and RV, therefore ended up on NO and vasopressor support with milrinone.  He remains sedated and intubated with tenuous hemodynamics today.  PCCM consulted to help with vent management.  HF also being consulted to help with pulmonary hypertension.    Of note, patient is followed in our pulmonary office by Dr. SHalford Chessman last seen 08/10/21 by BDerl Barrow NP.  Previous CTA 6/22 neg for PE, noted underlying emphysematous changes with areas  of fibrosis and scarring, nodular opacity in RML (smaller now than previous imaging).  Reported compliance with CPAP and anoro.  PFT 08/09/21- FVC 3.42 (75%), FEV1 2. 60 (77%), ratio 76, DLCOunc 10/29 (38%).  Uses O2 2L at rest, and 3-4L with exertions for goal sats > 88-90%.  Pertinent  Medical History  Former Tobacco abuse (quit 2010), centrilobular emphysema, pulmonary hypertension, OSA on CPAP, PAF, CAD w/ LAD stent 2003, HLD, MSSA sepsis c/b lumbar discitis and infected left sternoclavicular joint 08/2019   Significant Hospital Events: Including procedures, antibiotic start and stop dates in addition to other pertinent events   9/8 aortic valve replacement with bioprosthetic valve and biatrial Maze procedure  9/8 ETT >> 9/8 foley >> 9/8 R IJ cordis with PA cath >> 9/8 L radial Aline >> 9/8 L femoral aline (placed in OR given variable L radial tracings)>> 9/8 parasternal pacing wires >> 9/8 mediastinal CT x 2 >>  9/8 aortic tissue cx >> 9/8 aortic tissue AFB >> 9/8 aortic fungal >> 9/9 Bcx 2 >>  9/8 ancef >>9/9 9/8 vanc >> 9/9 cefepime >>  Interim History / Subjective:  9/12:  remains on inotropic and vasopressor support as well as iNO. Coox 73. Advanced HF team diuresing, attempt to wean NE and epi today. On 14 peep as well with adequate sats so will decreased to 12 and follow. Cont wean as able. Once on minimal settings will d/w HF about iNO wean as was placed for PA. Pressures remain elevated but improved from pre-op  9/11:Started on versed infusion last night for agitation. Does better with infusion  rather than IVP due to hypotension. Remains on fentanyl and precedex for sedation. On milrinone, low dose epi and NE. High TF residuals overnight. Tmax 100. CVP 11 PA 38/29 ( 33)  Objective   Blood pressure (!) 149/78, pulse 100, temperature 99 F (37.2 C), resp. rate (!) 24, height '6\' 1"'$  (1.854 m), weight (!) 146.1 kg, SpO2 95 %. PAP: (36-72)/(21-42) 55/28 CVP:  [10 mmHg-15  mmHg] 13 mmHg PCWP:  [24 mmHg-32 mmHg] 24 mmHg CO:  [10.8 L/min-11.6 L/min] 11.6 L/min CI:  [4.2 L/min/m2-4.5 L/min/m2] 4.5 L/min/m2  Vent Mode: PRVC FiO2 (%):  [40 %-50 %] 40 % Set Rate:  [26 bmp-30 bmp] 30 bmp Vt Set:  [630 mL] 630 mL PEEP:  [14 [name]20] 14 [name]20 Plateau Pressure:  [25 [name]20-27 [name]20] 27 [name]20   Intake/Output Summary (Last 24 hours) at 09/10/2021 0818 Last data filed at 09/10/2021 0600 Gross per 24 hour  Intake 3985.69 ml  Output 3520 ml  Net 465.69 ml   Filed Weights   09/13/2021 0801 09/09/21 0100 09/10/21 0456  Weight: 136.1 kg (!) 143 kg (!) 146.1 kg    Examination: General:  critically ill appearing man lying in bed in NAD HEENT: [place]/AT, eyes anicteric, mmmp Neuro:  RASS -2,  no cough reflex. CV: Dual paced rhythms, intermittently narrow complex. Distant heart sounds. Minimal serosanguinous drainage from mediastinal drains. PULM:  No significant ETT secretions. Ctab GI: obese, soft, NT Extremities: edema in all extremities.   Skin: warm, dry, no rashes.  Coox 73.5% BUN 36 Cr 1.58 WBC 10.7 H/H 10.7/35.1 Platelets 84 Valve cultures: predominantly monocytes, no organisms seen Fungal-negative AFB-negative 9/9 blood cultures> NGTD  CXR personally reviewed from 9/11>cardiomegaly, pulmonary edema  Resolved Hospital Problem list    Assessment & Plan:   Acute on chronic hypoxic respiratory failure (on home 4L O2 at home) OSA, on home CPAP-- not compliant when sleeping during the day at home Emphysema/ COPD Former tobacco abuse  -titrate vent as able , weaning peep -PAD protocol for sedation. Remains on 1 versed.  -VAP prevention protocol - FiO2 40% ,  so now weaning peep will work to wean iNO after -  vent settings not amendable for SBT  - con't nebulized bronchodilators- brovana and yupelri.  On Anoro PTA. 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 Mononuclear cells on path suggests a more chronic process.   -follow cultures until finalized - Con't broad spectrum antibiotics until hemodynamically improved and additional culture data available but with ckd will change to ctx and vanc. (Cx negative but hx of mssa discitis in 2020). AI developing at time of discitis.  -additionally no autoimmune w/u done so for completeness will send rf, lupus and antiphospholipid -consult ID for completeness sake - trend WBC/ fever curve : both improving  Cardiogenic shock due to decompensated RV failure. Baseline PA pressure 67/20, mean PAP 39 from [place] 06/2021. (Calculated PVR 2.18) Pulmonary hypertension causing acute RV failure OSA-- concern that this may be undertreated despite compliance with CPAP -Con't milrinone, norepi, epinephrine. -diuresis per HF -Con't iNO 30ppm.  -Anticipate he may required sildenafil for PH. -Con't PA pressure monitoring.  AI s/p AVR and MAZE procedure Hx PAF, CAD with LAD stent Shock- likely multifactorial - cardiogenic and possibly septic, improving. - post-op management per TCTS - con't pacing DDD - Cont' inotropic support. - Con't invasive hemodynamic monitoring with Swan.  - ASA, lipitor daily  Concern for ileus -con't dulcolax -adding senna and miralax -will  place dht post pyloric, tolerating tf at this time with low residuals, hopefully once dht placed can increase rate fuirther.   AKI on chronic CKD stage 3B, likely r/t hypoperfusion. AKI resolved. - Con't to maintain adequate BP and CO - Strict I/Os, con't foley catheter. - con't daily monitoring - Renally dose medications and avoid nephrotoxic meds.  Mild acute anemia, likely due to operative blood loss-we will - Transfuse for hemoglobin less than 7 or hemodynamically significant bleeding. -Continue daily monitoring.  Acute thrombocytopenia, likely consumptive related to surgery and bypass, improving - Continue to monitor  DM with hyperglycemia; A1c 6.5 -Continue Levemir 30 twice daily; anticipate this  may increase or he may require tube feeding coverage as tube feed tolerance increases - Sliding scale insulin as needed -Goal BG <180  Morbid obesity, BMI 39.5 kg/m2 -Long-term recommend moderate weight loss  Best Practice (right click and "Reselect all SmartList Selections" daily)   Diet/type: tubefeeds DVT prophylaxis: SCD, defer to TCTS GI prophylaxis: PPI Lines: yes and it is still needed Foley:  Yes, and it is still needed Code Status:  full code Last date of multidisciplinary goals of care discussion [wife updated at bedside 9/ 11]  Labs   CBC: Recent Labs  Lab 09/07/21 0406 09/07/21 0926 09/07/21 1637 09/08/21 0346 09/08/21 0936 09/09/21 0314 09/09/21 0316 09/10/21 0334 09/10/21 0600  WBC 15.9*  --  15.0* 14.7*  --  11.8*  --   --  10.7*  HGB 12.9*   < > 11.5* 11.1* 11.9* 10.8* 10.5* 10.5* 10.7*  HCT 40.5   < > 36.1* 34.8* 35.0* 33.7* 31.0* 31.0* 35.1*  MCV 93.5  --  93.3 93.5  --  94.4  --   --  95.6  PLT 118*  --  82* 73*  --  79*  --   --  84*   < > = values in this interval not displayed.    Basic Metabolic Panel: Recent Labs  Lab 09/28/2021 2131 09/07/21 0403 09/07/21 0406 09/07/21 [id number] 09/07/21 1637 09/08/21 0346 09/08/21 0936 09/09/21 0314 09/09/21 0316 09/09/21 1633 09/10/21 0334 09/10/21 0600  NA 140   < > 139   < > 139 140   < > 141 142 140 144 141  K 3.7   < > 3.9   < > 4.1 4.1   < > 4.0 3.8 3.9 4.1 4.3  CL 108  --  107  --  109 110  --  112*  --  110  --  112*  CO2 23  --  24  --  24 23  --  23  --  23  --  22  GLUCOSE 208*  --  162*  --  108* 123*  --  145*  --  175*  --  145*  BUN 31*  --  33*  --  35* 35*  --  30*  --  32*  --  36*  CREATININE 1.87*  --  1.98*  --  1.98* 1.84*  --  1.41*  --  1.48*  --  1.58*  CALCIUM 8.4*  --  8.6*  --  8.0* 8.2*  --  8.3*  --  8.2*  --  8.7*  MG 3.0*  --  3.0*  --  2.9*  --   --  2.6*  --   --   --  2.3  PHOS  --   --   --   --   --   --   --  2.7  --   --   --  4.9*   < > = values in this interval  not displayed.   GFR: Estimated Creatinine Clearance: 64.5 mL/min (A) (by C-G formula based on SCr of 1.58 mg/dL (H)). Recent Labs  Lab 09/07/21 1637 09/08/21 0346 09/09/21 0314 09/10/21 0600  WBC 15.0* 14.7* 11.8* 10.7*    Liver Function Tests: Recent Labs  Lab 09/04/21 1130 09/08/21 0346  AST 26 61*  ALT 32 14  ALKPHOS 50 44  BILITOT 1.5* 1.0  PROT 6.4* 4.9*  ALBUMIN 3.8 3.0*   No results for input(s): LIPASE, AMYLASE in the last 168 hours. No results for input(s): AMMONIA in the last 168 hours.  ABG    Component Value Date/Time   PHART 7.290 (L) 09/10/2021 0334   PCO2ART 46.7 09/10/2021 0334   PO2ART 85 09/10/2021 0334   HCO3 22.4 09/10/2021 0334   TCO2 24 09/10/2021 0334   ACIDBASEDEF 4.0 (H) 09/10/2021 0334   O2SAT 73.5 09/10/2021 0337      This patient is critically ill with multiple organ system failure which requires frequent high complexity decision making, assessment, support, evaluation, and titration of therapies. This was completed through the application of advanced monitoring technologies and extensive interpretation of multiple databases. During this encounter critical care time was devoted to patient care services described in this note for 50 minutes.  Audria Nine, DO 09/10/21 8:18 AM [facility]

## 2021-09-10 NOTE — Procedures (Signed)
Cortrak  Tube Type:  Cortrak - 43 inches Tube Location:  Left nare Initial Placement:  Stomach Secured by: Bridle Technique Used to Measure Tube Placement:  Marking at nare/corner of mouth Cortrak Secured At:  95 cm  Cortrak Tube Team Note:  Consult received to place a Cortrak feeding tube.   X-ray is required, abdominal x-ray has been ordered by the Cortrak team. Please confirm tube placement before using the Cortrak tube.   If the tube becomes dislodged please keep the tube and contact the Cortrak team at [email or web address] (password [email or web address]) for replacement.  If after hours and replacement cannot be delayed, place a NG tube and confirm placement with an abdominal x-ray.    Koleen Distance MS, RD, LDN Please refer to [facility] for RD and/or RD on-call/weekend/after hours pager

## 2021-09-10 NOTE — Progress Notes (Signed)
Pharmacy Antibiotic Note  [name] [name]. is a 70 y.o. male admitted on 09/25/2021 with AVR. Pt has hx MSSA bacteremia/discitis s/p cefazolin in 2020. 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 Pharmacy has been consulted for vancomycin and cefepime dosing.   On day #5 of antibiotics today- cefepime changed to ceftriaxone per ID recs. WBC 10.7, Tmax 99, CRP 2.6, sed rate 10. Scr 1.58 (CrCl 65 mL/min). Vancomycin peak today, plan for vancomycin trough on 9/13'@1030'$ .   Plan: Vancomycin '1250mg'$  q24h - est AUC 508 Ceftriaxone 2g IV every 24 hours  Monitor renal fx, cx results, clinical pic  Height: '6\' 1"'$  (185.4 cm) Weight: (!) 146.1 kg (322 lb 1.5 oz) IBW/kg (Calculated) : 79.9  Temp (24hrs), Avg:98.8 F (37.1 C), Min:98.1 F (36.7 C), Max:99.1 F (37.3 C)  Recent Labs  Lab 09/07/21 0406 09/07/21 1637 09/08/21 0346 09/09/21 0314 09/09/21 1633 09/10/21 0600  WBC 15.9* 15.0* 14.7* 11.8*  --  10.7*  CREATININE 1.98* 1.98* 1.84* 1.41* 1.48* 1.58*     Estimated Creatinine Clearance: 64.5 mL/min (A) (by C-G formula based on SCr of 1.58 mg/dL (H)).    Allergies  Allergen Reactions   Pseudoephedrine Other (See Comments)    Patient went into afib   Diltiazem Rash and Itching    Also 2019   Novocain [Procaine] Hives    Dentist appointment in 1958; since then has tolerated lidocaine and provocaine with no hives or difficulty.   Quinolones     Patient was warned about not using Cipro and similar antibiotics. Recent studies have raised concern that fluoroquinolone antibiotics could be associated with an increased risk of aortic aneurysm Fluoroquinolones have non-antimicrobial properties that might jeopardise the integrity of the extracellular matrix of the vascular wall In a  propensity score matched cohort study in [place], there was a 66% increased rate of aortic aneurysm or dissection associated with oral fluoroquinolone use, compared wit   Sulfonamide  Derivatives Other (See Comments)    Childhood reaction     Antimicrobials this admission: Cefazolin 9/8 >> 9/9 Vancomycin 9/9 >>  Cefepime 9/9 >> 9/12 Ceftriaxone 9/12 >>   Microbiology results: 9/8 Valve cx - pending 9/9 Bcx - ngtd    Thank you for allowing pharmacy to be a part of this patient's care.  Antonietta Jewel, PharmD, [place] Clinical Pharmacist  Phone: [phone] 09/10/2021 2:47 PM  Please check AMION for all [facility] phone numbers After 10:00 PM, call [place] [phone]

## 2021-09-10 NOTE — Consult Note (Signed)
[facility]    Date of Admission:  09/26/2021   Total days of antibiotics 5        Day 1 Rocephin        Day 4 Vancomycin        Cefepime 09/07/21-09/10/21       Reason for Consult: Endocarditis    Provider: Prudencio Pair, MD Primary Care Provider: Burnis Medin, MD  Assessment:  [name] [name]. is a 70 y.o. male with history of paroxysmal atrial fibrillation status post multiple cardioversions in the past who has been maintained on Tikosyn, coronary disease status post stenting of his LAD in 2003, hyperlipidemia, OSA on CPAP, who had MSSA sepsis with development of lumbar discitis and an infected left sternoclavicular joint in 08/2019.  He underwent incision and drainage and debridement of left sternoclavicular joint with application of a wound VAC by Dr. Servando Snare on 08/31/2019.  He was treated with a long course of intravenous nafcillin and cefazolin followed by oral Keflex.   A 2D echocardiogram on 05/25/2021 showed moderate to severe aortic insufficiency. TEE performed on 07/10/2021 showed an ejection fraction of 55 to 60%.  There is mild dilatation of the ascending aorta at 4.3 cm.  There is severe aortic insufficiency with pressure half-time of 485 ms.  The aortic root diameter is measured at 3.6 cm and the ascending aorta measured at 4.2 cm.   09/01/2021- Patient had an aortic valve replacement. 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 Operative GS showed WBC but no organisms. Blood cultures-negative  09/10/21-Patient is Afebrile; BMP significant for Cr 1.58; CBC significant for leukocytosis 10.7 Patient unconscious during interview; NGT placed today.  Plan: Valve sent off for testing of causative organism- molecular testing for bacteria/fungal/mycobacteria Continue vancomycin and Rocephin Autoimmune labs- pending Trend BMP and CBC  Active Problems:   S/P aortic valve replacement with  bioprosthetic valve   Severe aortic regurgitation   Scheduled Meds:  acetaminophen  1,000 mg Oral Q6H   Or   acetaminophen (TYLENOL) oral liquid 160 mg/5 mL  1,000 mg Per Tube Q6H   arformoterol  15 mcg Nebulization BID   aspirin EC  325 mg Oral Daily   Or   aspirin  324 mg Per Tube Daily   atorvastatin  80 mg Per Tube Daily   bisacodyl  10 mg Oral Daily   Or   bisacodyl  10 mg Rectal Daily   brimonidine  1 drop Both Eyes TID   busPIRone  15 mg Per Tube BID   chlorhexidine gluconate (MEDLINE KIT)  15 mL Mouth Rinse BID   Chlorhexidine Gluconate Cloth  6 each Topical Daily   clonazePAM  1 mg Per Tube BID   docusate  200 mg Per Tube Daily   feeding supplement (PROSource TF)  90 mL Per Tube TID   feeding supplement (VITAL 1.5 CAL)  1,000 mL Per Tube Q24H   fentaNYL (SUBLIMAZE) injection  25 mcg Intravenous Once   fluticasone  2 spray Each Nare Daily   furosemide  40 mg Intravenous BID   insulin aspart  0-20 Units Subcutaneous Q4H   insulin detemir  30 Units Subcutaneous BID   mouth rinse  15 mL Mouth Rinse 10 times per day   metoCLOPramide (REGLAN) injection  10 mg Intravenous Once   pantoprazole sodium  40 mg Per Tube Daily   PARoxetine  60 mg Per  Tube Daily   polyethylene glycol  17 g Per Tube Daily   revefenacin  175 mcg Nebulization Daily   senna  1 tablet Per Tube Daily   sodium chloride flush  3 mL Intravenous Q12H   Continuous Infusions:  sodium chloride 20 mL/hr at 09/10/21 1400   sodium chloride     sodium chloride     cefTRIAXone (ROCEPHIN)  IV     epinephrine 6 mcg/min (09/10/21 1400)   fentaNYL infusion INTRAVENOUS 175 mcg/hr (09/10/21 1400)   lactated ringers     lactated ringers 20 mL/hr at 09/10/21 1400   midazolam 1 mg/hr (09/10/21 1400)   milrinone 0.375 mcg/kg/min (09/10/21 1400)   nitroGLYCERIN     norepinephrine (LEVOPHED) Adult infusion 9 mcg/min (09/10/21 1400)   vancomycin Stopped (09/10/21 1210)   PRN Meds:.sodium chloride, dextrose, fentaNYL,  levalbuterol, midazolam, morphine injection, ondansetron (ZOFRAN) IV, sodium chloride flush  HPI: Patient was unconscious and having NGT placed. Wife [name] [name] was present at bedside. She provided collateral information stating that the patient has a remote history of bacteremia. Denies any pets in the home, or history of [place].   Review of Systems: Review of Systems  Constitutional:        Patient was unconscious during interview   Past Medical History:  Diagnosis Date   Anxiety    Atrial fibrillation ([place]) 03/22/2009   a. s/p multiple DCCV; b. no coumadin due to low TE risk profile; c. Tikosyn Rx   COPD (chronic obstructive pulmonary disease) (HCC)    Coronary atherosclerosis of native coronary artery 11/2002   a. s/p stent to LAD 12/03; OM2 occluded at cath 12/03; d. myoview 5/10: no ischemia;  e. echo 7/11: EF 55%, BAE, mild RVE, PASP 41-45; Myoview was in March 2013. There was no ischemia or infarction, EF 51%    Cutaneous abscess of back excluding buttocks 07/04/2014   Appears to stem from possibly a cyst very large area 6 cm contact surgeon office    Diabetes mellitus without complication ([place])    Drusen body    see opth note   Dyspnea    Dysrhythmia    Epiglottitis    w emergency nt intubation   ERECTILE DYSFUNCTION 03/22/2009   GERD 03/22/2009   Heart murmur    HYPERGLYCEMIA 04/25/2010   HYPERLIPIDEMIA 03/22/2009   Hypertension    Iliac aneurysm (HCC)    2.6 to be evaluated incidental finding on CT   LATERAL EPICONDYLITIS, LEFT 10/24/2009   LIVER FUNCTION TESTS, ABNORMAL 04/25/2010   Local reaction to immunization 05/05/2012   minor resolving  zostavax    Myocardial infarction [facility]) mi2003   Numbness in left leg    foot related to back disease and surgery   Obesity, unspecified 04/24/2009   Perforated appendicitis with necrosis s/p open appendectomy 06/07/14 06/04/2014   Renal cyst    Characterized by MRI as simple    Ruptured suppurative appendicitis    2015    SLEEP APNEA, OBSTRUCTIVE 03/22/2009   compliant with CPAP   THROMBOCYTOPENIA 08/16/2010   TOBACCO USE, QUIT 10/24/2009   ULNAR NEUROPATHY, LEFT 93/57/0177   Umbilical hernia     Social History   Tobacco Use   Smoking status: Former    Packs/day: 2.00    Years: 42.00    Pack years: 84.00    Types: Cigarettes    Quit date: 12/30/2008    Years since quitting: 12.7   Smokeless  tobacco: Never   Tobacco comments:    started at age 1; 1-2 ppd; quit 2010  Vaping Use   Vaping Use: Never used  Substance Use Topics   Alcohol use: No    Comment: seldom   Drug use: No    Family History  Problem Relation Age of Onset   Thyroid disease Mother    Ovarian cancer Mother    Breast cancer Mother    Lung cancer Father    Cancer Father    Allergies  Allergen Reactions   Pseudoephedrine Other (See Comments)    Patient went into afib   Diltiazem Rash and Itching    Also 2019   Novocain [Procaine] Hives    Dentist appointment in 1958; since then has tolerated lidocaine and provocaine with no hives or difficulty.   Quinolones     Patient was warned about not using Cipro and similar antibiotics. Recent studies have raised concern that fluoroquinolone antibiotics could be associated with an increased risk of aortic aneurysm Fluoroquinolones have non-antimicrobial properties that might jeopardise the integrity of the extracellular matrix of the vascular wall In a  propensity score matched cohort study in [place], there was a 66% increased rate of aortic aneurysm or dissection associated with oral fluoroquinolone use, compared wit   Sulfonamide Derivatives Other (See Comments)    Childhood reaction     OBJECTIVE: Blood pressure 100/61, pulse (!) 114, temperature 99 F (37.2 C), resp. rate (!) 30, height $RemoveBe'6\' 1"'GEGImDwAm$  (1.854 m), weight (!) 146.1 kg, SpO2 95 %.  Physical Exam: Deferred  Lab Results Lab Results  Component Value Date   WBC 10.7 (H)  09/10/2021   HGB 10.7 (L) 09/10/2021   HCT 35.1 (L) 09/10/2021   MCV 95.6 09/10/2021   PLT 84 (L) 09/10/2021    Lab Results  Component Value Date   CREATININE 1.58 (H) 09/10/2021   BUN 36 (H) 09/10/2021   NA 141 09/10/2021   K 4.3 09/10/2021   CL 112 (H) 09/10/2021   CO2 22 09/10/2021    Lab Results  Component Value Date   ALT 14 09/08/2021   AST 61 (H) 09/08/2021   ALKPHOS 44 09/08/2021   BILITOT 1.0 09/08/2021     Microbiology: Recent Results (from the past 240 hour(s))  Surgical pcr screen     Status: None   Collection Time: 09/04/21 10:48 AM   Specimen: Nasal Mucosa; Nasal Swab  Result Value Ref Range Status   MRSA, PCR NEGATIVE NEGATIVE Final   Staphylococcus aureus NEGATIVE NEGATIVE Final    Comment: (NOTE) The Xpert SA Assay (FDA approved for NASAL specimens in patients 44 years of age and older), is one component of a comprehensive surveillance program. It is not intended to diagnose infection nor to guide or monitor treatment. Performed at [facility], [place] [address], [place], [place] [address]   SARS CORONAVIRUS 2 (TAT 6-24 HRS) Nasopharyngeal Nasopharyngeal Swab     Status: None   Collection Time: 09/04/21 10:49 AM   Specimen: Nasopharyngeal Swab  Result Value Ref Range Status   SARS Coronavirus 2 NEGATIVE NEGATIVE Final    Comment: (NOTE) SARS-CoV-2 target nucleic acids are NOT DETECTED.  The SARS-CoV-2 RNA is generally detectable in upper and lower respiratory specimens during the acute phase of infection. Negative results do not preclude SARS-CoV-2 infection, do not rule out co-infections with other pathogens, and should not be used as the sole basis for treatment or other patient management decisions. Negative results must be combined  with clinical observations, patient history, and epidemiological information. The expected result is Negative.  Fact Sheet for Patients: SugarRoll.be  Fact Sheet for Healthcare  Providers: https://www.woods-mathews.com/  This test is not yet approved or cleared by the [place] FDA and  has been authorized for detection and/or diagnosis of SARS-CoV-2 by FDA under an Emergency Use Authorization (EUA). This EUA will remain  in effect (meaning this test can be used) for the duration of the COVID-19 declaration under Se ction 564(b)(1) of the Act, 21 U.S.C. section 360bbb-3(b)(1), unless the authorization is terminated or revoked sooner.  Performed at [facility], [place] [address], [place], [place] [address]   Aerobic/Anaerobic Culture w Gram Stain (surgical/deep wound)     Status: None (Preliminary result)   Collection Time: 09/13/2021 12:34 PM   Specimen: Aortic Valve Leaflets; Tissue  Result Value Ref Range Status   Specimen Description TISSUE  Final   Special Requests AORTIC VALVE LEAFLETS  Final   Gram Stain   Final    MODERATE WBC PRESENT, PREDOMINANTLY MONONUCLEAR NO ORGANISMS SEEN    Culture   Final    NO GROWTH 4 DAYS NO ANAEROBES ISOLATED; CULTURE IN PROGRESS FOR 5 DAYS Performed at [facility], [address], [place], [place] [address]    Report Status PENDING  Incomplete  Acid Fast Smear (AFB)     Status: None   Collection Time: 09/21/2021 12:34 PM   Specimen: Aortic Valve Leaflets; Tissue  Result Value Ref Range Status   AFB Specimen Processing Concentration  Final   Acid Fast Smear Negative  Final    Comment: (NOTE) Performed At: [facility] [place], [place] [id number] Rush Farmer MD LN:[id number]    Source (AFB) TISSUE  Final    Comment: AORTIC VALVE LEAFLET Performed at [facility], [place] [address] [place], [place] [address]   Culture, blood (routine x 2)     Status: None (Preliminary result)   Collection Time: 09/07/21  9:00 AM   Specimen: BLOOD LEFT HAND  Result Value Ref Range Status   Specimen Description BLOOD LEFT HAND  Final   Special Requests   Final    BOTTLES  DRAWN AEROBIC ONLY Blood Culture adequate volume   Culture   Final    NO GROWTH 2 DAYS Performed at [facility], [place] [address] [place], [place] [address]    Report Status PENDING  Incomplete  Culture, blood (routine x 2)     Status: None (Preliminary result)   Collection Time: 09/07/21  9:12 AM   Specimen: BLOOD RIGHT HAND  Result Value Ref Range Status   Specimen Description BLOOD RIGHT HAND  Final   Special Requests   Final    BOTTLES DRAWN AEROBIC ONLY Blood Culture adequate volume   Culture   Final    NO GROWTH 2 DAYS Performed at [facility], [place] [address], [place], [place] [address]    Report Status PENDING  Incomplete   INTRAOPERATIVE TRANSESOPHAGEAL REPORT POST-OP IMPRESSIONS  Left Ventricle: The left ventricle is unchanged from pre-bypass.  Right Ventricle: The right ventricle appears unchanged from pre-bypass mildly reduced function. The cavity was mildly dilated.  Aorta: The aorta appears unchanged from pre-bypass there is no dissection present in the aorta.  Left Atrium: The left atrium appears unchanged from pre-bypass.  Left Atrial Appendage: A is completely occluded Atrial Clip was placed.  Aortic Valve: There is no regurgitation. No regurgitation post repair. The  gradient recorded across the prosthetic valve is within the expected range. No perivalvular leak noted.  Mitral Valve: The mitral valve appears unchanged from pre-bypass.  Pulmonic Valve: The pulmonic valve appears unchanged from pre-bypass.  Interventricular Septum: The interventricular septum appears unchanged from pre-bypass. PRE-OP FINDINGS Left Ventricle: The left ventricle has normal systolic function, with an ejection fraction of 55-60%. The cavity size was normal. No evidence of left ventricular regional wall motion abnormalities. There is no left ventricular hypertrophy. Left ventricular diastolic parameters were normal. Right Ventricle: The right ventricle has moderately reduced  systolic function. The cavity was normal. There is no increase in right ventricular wall thickness. There is no aneurysm seen. Left Atrium: Left atrial size was dilated. No left atrial/left atrial appendage thrombus was detected. The left atrial appendage is well visualized and there is no evidence of thrombus present. Left atrial appendage velocity is normal at greater than 40 cm/s. Right Atrium: Right atrial size was dilated. Interatrial Septum: No atrial level shunt detected by color flow Doppler. There is no evidence of a patent foramen ovale.  ECHO COMPLETE  Left Ventricle: Abnormal septal motion consistent with postoperative state. Gobal hypokinesis. Compared with the echo 08/300, systolic function is worse. Left ventricular ejection fraction, by estimation, is 40 to 45%. The left ventricle has mildly decreased function. The left ventricle demonstrates regional wall motion abnormalities. The left ventricular internal cavity size was normal in size. There is no left ventricular hypertrophy. Left ventricular diastolic parameters are consistent with Grade II diastolic dysfunction (pseudonormalization). Right Ventricle: The right ventricular size is normal. No increase in right ventricular wall thickness. Right ventricular systolic function is normal. There is normal pulmonary artery systolic pressure. The tricuspid regurgitant velocity is 2.39 m/s, and with an assumed right atrial pressure of 0 mmHg, the estimated right ventricular systolic pressure is 49.9 mmHg. Left Atrium: Left atrial size was severely dilated. Right Atrium: Right atrial size was normal in size. Pericardium: There is no evidence of pericardial effusion. Mitral Valve: The mitral valve is degenerative in appearance. Severe mitral annular calcification. No evidence of mitral valve regurgitation. No evidence of mitral valve stenosis. MV peak gradient, 5.6 mmHg. The mean mitral valve gradient is 3.0 mmHg. Tricuspid Valve:  The tricuspid valve is normal in structure. Tricuspid valve regurgitation is trivial. No evidence of tricuspid stenosis. Aortic Valve: The aortic valve has been repaired/replaced. Aortic valve regurgitation is not visualized. No aortic stenosis is present. Aortic valve mean gradient measures 11.0 mmHg. Aortic valve peak gradient measures 19.9 mmHg. Aortic valve area, by VTI measures 2.52 cm. There is a 27 mm Edwards Inspiris Resilia valve present in the aortic position. Pulmonic Valve: The pulmonic valve was normal in structure. Pulmonic valve regurgitation is not visualized. No evidence of pulmonic stenosis. Aorta: Aortic dilatation noted. There is mild dilatation of the aortic root, measuring 42 mm. There is moderate dilatation of the ascending aorta, measuring 43 mm. IAS/Shunts: No atrial level shunt detected by color flow Doppler.   PORTABLE CHEST 1 VIEW   COMPARISON:  September 08, 2021   FINDINGS: Mediastinal contour and cardiac silhouette are stable. Heart size is enlarged. Endotracheal tube is unchanged. Swan-Ganz catheter is unchanged. Mediastinal drain is unchanged. Mild diffuse increased pulmonary interstitium is identified bilaterally. There is minimal left pleural effusion. The bony structures are stable.   IMPRESSION: Mild congestive heart failure.  Minimal left pleural effusion.   Life supporting devices are unchanged.  PORTABLE ABDOMEN - 1 VIEW   COMPARISON:  None.  FINDINGS: The bowel gas pattern is normal. Enteric feeding tube projects over the expected area of the pylorus. No radio-opaque calculi or other significant radiographic abnormality are seen.   Partially visualized lung bases demonstrate a PA catheter, cardiomegaly and mild pulmonary edema.   IMPRESSION: Enteric feeding tube projects over the expected area of the pylorus.  Timothy Lasso, MD PGY-1 Internal Medicine Resident [facility] [place] [phone] pager    09/10/2021, 2:24 PM

## 2021-09-10 NOTE — Progress Notes (Addendum)
EVENING ROUNDS NOTE :     [address]       [place] [address]             [phone]                 4 Days Post-Op Procedure(s) (LRB): AORTIC VALVE REPLACEMENT (AVR) USING INSPIRIS AORTIC VALVE 27 MM (N/A) MAZE (N/A) TRANSESOPHAGEAL ECHOCARDIOGRAM (TEE) (N/A) APPLICATION OF CELL SAVER CLIPPING OF ATRIAL APPENDAGE USING ATRICLIP PRO 240  Total Length of Stay:  LOS: 4 days  BP (!) 106/49   Pulse (!) 114   Temp 99.1 F (37.3 C)   Resp (!) 30   Ht '6\' 1"'$  (1.854 m)   Wt (!) 146.1 kg   SpO2 95%   BMI 42.49 kg/m   .Intake/Output      09/11 0701 09/12 0700 09/12 0701 09/13 0700   I.V. (mL/kg) 2625.7 (18) 820 (5.6)   Other     NG/GT 890 140   IV Piggyback 470 350.1   Total Intake(mL/kg) 3985.7 (27.3) 1310.1 (9)   Urine (mL/kg/hr) 3570 (1) 2000 (1.5)   Emesis/NG output     Chest Tube 50 30   Total Output 3620 2030   Net +365.7 -719.9           sodium chloride 20 mL/hr at 09/10/21 1527   sodium chloride     sodium chloride     cefTRIAXone (ROCEPHIN)  IV     epinephrine 6 mcg/min (09/10/21 1525)   fentaNYL infusion INTRAVENOUS 175 mcg/hr (09/10/21 1400)   lactated ringers 20 mL (09/10/21 1527)   lactated ringers 20 mL/hr at 09/10/21 1400   midazolam 1 mg/hr (09/10/21 1400)   milrinone 0.375 mcg/kg/min (09/10/21 1524)   nitroGLYCERIN     norepinephrine (LEVOPHED) Adult infusion 8 mcg/min (09/10/21 1523)   vancomycin Stopped (09/10/21 1210)     Lab Results  Component Value Date   WBC 10.7 (H) 09/10/2021   HGB 10.7 (L) 09/10/2021   HCT 35.1 (L) 09/10/2021   PLT 84 (L) 09/10/2021   GLUCOSE 145 (H) 09/10/2021   CHOL 134 07/13/2021   TRIG 103.0 07/13/2021   HDL 45.50 07/13/2021   LDLCALC 68 07/13/2021   ALT 14 09/08/2021   AST 61 (H) 09/08/2021   NA 141 09/10/2021   K 4.3 09/10/2021   CL 112 (H) 09/10/2021   CREATININE 1.58 (H) 09/10/2021   BUN 36 (H) 09/10/2021   CO2 22 09/10/2021   TSH 1.414 08/27/2019   PSA 1.28 09/14/2020   INR  1.6 (H) 09/08/2021   HGBA1C 6.5 (H) 09/04/2021   MICROALBUR 18.7 (H) 02/08/2019  Patient remains intubated;he is able follow commands PEEP now at 12;pulmonary/CCM On Milrinone, Epinephrine, Nor epinephrine drips, and NO. Clinically, slowly improving.  Lars Pinks PA-C 09/10/2021 3:59 PM  Patient seen and examined, discussed with Rn Remains on multiple vasoactive agents. BP remains labile Continue current Rx.  Revonda Standard Roxan Hockey, MD [facility] [phone]

## 2021-09-10 NOTE — Progress Notes (Addendum)
Advanced Heart Failure Rounding Note  PCP-Cardiologist: [name], MD   Subjective:   9/11 Echo EF 40-45% RV ok. RVSP 22.8   Remains intubated FiO2 40%  PEEP 14  Remains on Norepi 10, epi 6, milrinone 0.375 mcg, iNO 30   Swan #s  PA 59/27 PVR 2.7  SVR 730  CO 10.3 CI 4  CO-OX 73.5%     Objective:   Weight Range: (!) 146.1 kg Body mass index is 42.49 kg/m.   Vital Signs:   Temp:  [97.9 F (36.6 C)-99.1 F (37.3 C)] 99 F (37.2 C) (09/12 0645) Pulse Rate:  [81-100] 100 (09/12 0326) Resp:  [20-30] 30 (09/12 0645) BP: (83-161)/(51-101) 118/75 (09/12 0600) SpO2:  [90 %-98 %] 96 % (09/12 0645) Arterial Line BP: (88-233)/(34-96) 169/72 (09/12 0645) FiO2 (%):  [40 %-50 %] 40 % (09/12 0600) Weight:  [146.1 kg] 146.1 kg (09/12 0456) Last BM Date: 09/05/21  Weight change: Filed Weights   09/28/2021 0801 09/09/21 0100 09/10/21 0456  Weight: 136.1 kg (!) 143 kg (!) 146.1 kg    Intake/Output:   Intake/Output Summary (Last 24 hours) at 09/10/2021 0712 Last data filed at 09/10/2021 0600 Gross per 24 hour  Intake 3985.69 ml  Output 3620 ml  Net 365.69 ml      Physical Exam   CVP 11-12  General:  Intubated/sedated HEENT: ETT Neck: Supple. JVP difficult to assess.  Carotids 2+ bilat; no bruits. No lymphadenopathy or thyromegaly appreciated. RIJ swan  Cor: PMI nondisplaced. Regular rate & rhythm. No rubs, gallops or murmurs. Lungs: Clear Abdomen: Soft, nontender, nondistended. No hepatosplenomegaly. No bruits or masses. Good bowel sounds. Extremities: No cyanosis, clubbing, rash, edema. Left Fem A line , L arterial a line.  Neuro: Opens eyes. MAE x2 GU: foley  Telemetry   A paced 90   EKG    N/A  Labs    CBC Recent Labs    09/09/21 0314 09/09/21 0316 09/10/21 0334 09/10/21 0600  WBC 11.8*  --   --  10.7*  HGB 10.8*   < > 10.5* 10.7*  HCT 33.7*   < > 31.0* 35.1*  MCV 94.4  --   --  95.6  PLT 79*  --   --  84*   < > = values in this  interval not displayed.   Basic Metabolic Panel Recent Labs    09/09/21 0314 09/09/21 0316 09/09/21 1633 09/10/21 0334 09/10/21 0600  NA 141   < > 140 144 141  K 4.0   < > 3.9 4.1 4.3  CL 112*  --  110  --  112*  CO2 23  --  23  --  22  GLUCOSE 145*  --  175*  --  145*  BUN 30*  --  32*  --  36*  CREATININE 1.41*  --  1.48*  --  1.58*  CALCIUM 8.3*  --  8.2*  --  8.7*  MG 2.6*  --   --   --  2.3  PHOS 2.7  --   --   --  4.9*   < > = values in this interval not displayed.   Liver Function Tests Recent Labs    09/08/21 0346  AST 61*  ALT 14  ALKPHOS 44  BILITOT 1.0  PROT 4.9*  ALBUMIN 3.0*   No results for input(s): LIPASE, AMYLASE in the last 72 hours. Cardiac Enzymes No results for input(s): CKTOTAL, CKMB, CKMBINDEX, TROPONINI in the last 72  hours.  BNP: BNP (last 3 results) No results for input(s): BNP in the last 8760 hours.  ProBNP (last 3 results) No results for input(s): PROBNP in the last 8760 hours.   D-Dimer No results for input(s): DDIMER in the last 72 hours. Hemoglobin A1C No results for input(s): HGBA1C in the last 72 hours. Fasting Lipid Panel No results for input(s): CHOL, HDL, LDLCALC, TRIG, CHOLHDL, LDLDIRECT in the last 72 hours. Thyroid Function Tests No results for input(s): TSH, T4TOTAL, T3FREE, THYROIDAB in the last 72 hours.  Invalid input(s): FREET3  Other results:   Imaging    ECHOCARDIOGRAM COMPLETE  Result Date: 09/09/2021    ECHOCARDIOGRAM REPORT   Patient Name:   [name] [name]. Date of Exam: 09/09/2021 Medical Rec #:  [id number]          Height:       73.0 in Accession #:    [id number]         Weight:       315.3 lb Date of Birth:  [birth year]         BSA:          2.611 m Patient Age:    70 years           BP:           92/61 mmHg Patient Gender: M                  HR:           50 bpm. Exam Location:  Inpatient Procedure: 2D Echo Indications:    acute diastolic chf  History:        Patient has prior history of  Echocardiogram examinations, most                 recent 07/10/2021. CAD, Maze procedure, COPD and chronic kidney                 disease; Risk Factors:Diabetes, Former Smoker, Sleep Apnea and                 Dyslipidemia.                 Aortic Valve: 27 mm Edwards Inspiris Resilia valve is present in                 the aortic position.  Sonographer:    Johny Chess RDCS Referring Phys: [place] Comments: Patient is morbidly obese, echo performed with patient supine and on artificial respirator and no subcostal window. Image acquisition challenging due to respiratory motion and Image acquisition challenging due to patient body habitus. IMPRESSIONS  1. Abnormal septal motion consistent with postoperative state. Gobal hypokinesis. Compared with the echo 08/3789, systolic function is worse. Left ventricular ejection fraction, by estimation, is 40 to 45%. The left ventricle has mildly decreased function. The left ventricle demonstrates regional wall motion abnormalities (see scoring diagram/findings for description). Left ventricular diastolic parameters are consistent with Grade II diastolic dysfunction (pseudonormalization).  2. Right ventricular systolic function is normal. The right ventricular size is normal. There is normal pulmonary artery systolic pressure.  3. Left atrial size was severely dilated.  4. The mitral valve is degenerative. No evidence of mitral valve regurgitation. No evidence of mitral stenosis. Severe mitral annular calcification.  5. The aortic valve has been repaired/replaced. Aortic valve regurgitation is not visualized. No aortic stenosis is present. There is a 27 mm Edwards Inspiris Resilia valve present  in the aortic position. Aortic valve area, by VTI measures 2.52 cm. Aortic valve mean gradient measures 11.0 mmHg. Aortic valve Vmax measures 2.23 m/s.  6. Aortic dilatation noted. There is mild dilatation of the aortic root, measuring 42 mm. There is moderate  dilatation of the ascending aorta, measuring 43 mm. FINDINGS  Left Ventricle: Abnormal septal motion consistent with postoperative state. Gobal hypokinesis. Compared with the echo 02/6628, systolic function is worse. Left ventricular ejection fraction, by estimation, is 40 to 45%. The left ventricle has mildly decreased function. The left ventricle demonstrates regional wall motion abnormalities. The left ventricular internal cavity size was normal in size. There is no left ventricular hypertrophy. Left ventricular diastolic parameters are consistent with Grade II diastolic dysfunction (pseudonormalization). Right Ventricle: The right ventricular size is normal. No increase in right ventricular wall thickness. Right ventricular systolic function is normal. There is normal pulmonary artery systolic pressure. The tricuspid regurgitant velocity is 2.39 m/s, and  with an assumed right atrial pressure of 0 mmHg, the estimated right ventricular systolic pressure is 47.6 mmHg. Left Atrium: Left atrial size was severely dilated. Right Atrium: Right atrial size was normal in size. Pericardium: There is no evidence of pericardial effusion. Mitral Valve: The mitral valve is degenerative in appearance. Severe mitral annular calcification. No evidence of mitral valve regurgitation. No evidence of mitral valve stenosis. MV peak gradient, 5.6 mmHg. The mean mitral valve gradient is 3.0 mmHg. Tricuspid Valve: The tricuspid valve is normal in structure. Tricuspid valve regurgitation is trivial. No evidence of tricuspid stenosis. Aortic Valve: The aortic valve has been repaired/replaced. Aortic valve regurgitation is not visualized. No aortic stenosis is present. Aortic valve mean gradient measures 11.0 mmHg. Aortic valve peak gradient measures 19.9 mmHg. Aortic valve area, by VTI measures 2.52 cm. There is a 27 mm Edwards Inspiris Resilia valve present in the aortic position. Pulmonic Valve: The pulmonic valve was normal in  structure. Pulmonic valve regurgitation is not visualized. No evidence of pulmonic stenosis. Aorta: Aortic dilatation noted. There is mild dilatation of the aortic root, measuring 42 mm. There is moderate dilatation of the ascending aorta, measuring 43 mm. IAS/Shunts: No atrial level shunt detected by color flow Doppler.  LEFT VENTRICLE PLAX 2D LVIDd:         4.70 cm      Diastology LVIDs:         3.80 cm      LV e' lateral:   5.33 cm/s LVOT diam:     2.20 cm      LV E/e' lateral: 17.3 LV SV:         83 LV SV Index:   32 LVOT Area:     3.80 cm  LV Volumes (MOD) LV vol d, MOD A2C: 209.0 ml LV vol s, MOD A2C: 131.0 ml LV SV MOD A2C:     78.0 ml LEFT ATRIUM              Index       RIGHT ATRIUM           Index LA diam:        4.40 cm  1.69 cm/m  RA Area:     21.70 cm LA Vol (A2C):   124.0 ml 47.50 ml/m RA Volume:   62.00 ml  23.75 ml/m LA Vol (A4C):   110.0 ml 42.13 ml/m LA Biplane Vol: 120.0 ml 45.96 ml/m  AORTIC VALVE AV Area (Vmax):    2.54 cm AV Area (Vmean):  2.43 cm AV Area (VTI):     2.52 cm AV Vmax:           223.00 cm/s AV Vmean:          153.500 cm/s AV VTI:            0.330 m AV Peak Grad:      19.9 mmHg AV Mean Grad:      11.0 mmHg LVOT Vmax:         149.00 cm/s LVOT Vmean:        98.250 cm/s LVOT VTI:          0.219 m LVOT/AV VTI ratio: 0.66  AORTA Ao Asc diam: 4.30 cm MITRAL VALVE               TRICUSPID VALVE MV Area (PHT): 3.72 cm    TR Peak grad:   22.8 mmHg MV Area VTI:   3.80 cm    TR Vmax:        239.00 cm/s MV Peak grad:  5.6 mmHg MV Mean grad:  3.0 mmHg    SHUNTS MV Vmax:       1.18 m/s    Systemic VTI:  0.22 m MV Vmean:      76.3 cm/s   Systemic Diam: 2.20 cm MV Decel Time: 204 msec MV E velocity: 92.40 cm/s MV A velocity: 63.00 cm/s MV E/A ratio:  1.47 Skeet Latch MD Electronically signed by Skeet Latch MD Signature Date/Time: 09/09/2021/5:13:00 PM    Final      Medications:     Scheduled Medications:  acetaminophen  1,000 mg Oral Q6H   Or   acetaminophen (TYLENOL)  oral liquid 160 mg/5 mL  1,000 mg Per Tube Q6H   arformoterol  15 mcg Nebulization BID   aspirin EC  325 mg Oral Daily   Or   aspirin  324 mg Per Tube Daily   atorvastatin  80 mg Per Tube Daily   bisacodyl  10 mg Oral Daily   Or   bisacodyl  10 mg Rectal Daily   brimonidine  1 drop Both Eyes TID   busPIRone  15 mg Per Tube BID   chlorhexidine gluconate (MEDLINE KIT)  15 mL Mouth Rinse BID   Chlorhexidine Gluconate Cloth  6 each Topical Daily   clonazePAM  1 mg Per Tube BID   docusate  200 mg Per Tube Daily   feeding supplement (PROSource TF)  90 mL Per Tube TID   fentaNYL (SUBLIMAZE) injection  25 mcg Intravenous Once   fluticasone  2 spray Each Nare Daily   insulin aspart  0-20 Units Subcutaneous Q4H   insulin detemir  30 Units Subcutaneous BID   mouth rinse  15 mL Mouth Rinse 10 times per day   pantoprazole sodium  40 mg Per Tube Daily   PARoxetine  60 mg Per Tube Daily   polyethylene glycol  17 g Per Tube Daily   revefenacin  175 mcg Nebulization Daily   senna  1 tablet Per Tube Daily   sodium chloride flush  3 mL Intravenous Q12H    Infusions:  sodium chloride 20 mL/hr at 09/10/21 0600   sodium chloride     sodium chloride     ceFEPime (MAXIPIME) IV Stopped (09/09/21 2232)   dexmedetomidine (PRECEDEX) IV infusion Stopped (09/09/21 1328)   epinephrine 6 mcg/min (09/10/21 0600)   feeding supplement (VITAL 1.5 CAL) 1,000 mL (09/09/21 1700)   fentaNYL infusion INTRAVENOUS 175 mcg/hr (09/10/21 0600)  insulin Stopped (09/08/21 1149)   lactated ringers     lactated ringers 20 mL/hr at 09/10/21 0600   midazolam 1 mg/hr (09/10/21 0600)   milrinone 0.375 mcg/kg/min (09/10/21 0600)   nitroGLYCERIN     norepinephrine (LEVOPHED) Adult infusion 10 mcg/min (09/10/21 0600)   vancomycin Stopped (09/09/21 1252)    PRN Medications: sodium chloride, dextrose, fentaNYL, levalbuterol, midazolam, morphine injection, ondansetron (ZOFRAN) IV, sodium chloride flush    Patient Profile    70 y.o. with history of COPD/smoking, chronic severe aortic insufficiency, paroxysmal atrial fibrillation, CAD, type 2 diabetes, and CKD stage 3.  Patient had PCI to LAD in 12/03, LHC in 7/22 showed only mild CAD.  He has paroxysmal atrial fibrillation and had been maintained on Tikosyn.  He had not been ablated due to obesity.  Patient had lumbar discitis and left sternoclavicular joint infection in 9/20, had MSSA.  He had I&D left [place] joint and IV abx.  Echo at the time showed no vegetation and trivial AI.    HF team consulted for shock/pulmonary hypertension.   Assessment/Plan   1. Aortic insufficiency: TEE pre-op with severe AI, tricuspid aortic valve.  The LV was moderately dilated.  Now s/p bioprosthetic AVR.  The native valve was perforated with possible vegetation.  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  He had MSSA discitis with prolonged course back in 9/20.  Uncertain whether valve changes were chronic or more acute/subacute.  Valve tissue cultures NGTD so far.  2. Shock: Post-op.  Suspect mixed picture with RV dysfunction/severe pulmonary hypertension as well as possible distributive component.  Remains afebrile. .  Titrating down pressors.  He remains on iNO 30 ppm, epinephrine 6, NE 10, milrinone 0.375. CO-OX 73.5% Cardiac output 10 - Wean NE first, then slowly epinephrine.  - Would not change milrinone and iNO.  - Covering for possible sepsis picture with cefepime/vancomycin.  3.  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  Post-op echo with LV down 40-45% (post-correction of severe AI), normal RV, normal bioprosthetic aortic valve.  Post-op volume overload, CVP 11-12  . Continue IV lasix.  - Continue  Lasix  40 mg IV bid and follow response.  4.  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  Has significant lung  parenchymal disease, CTA chest in 6/22 with emphysema as well as areas of fibrosis, as well as OSA and ?OHS.  Pre-op was on CPAP and 4L home oxygen.  Initially, peri-operative PA pressure was very high, in the 90/45 range.   - PA pressure today 50s .   - Continue iNO 30 ppm for now, would not wean yet.  - Continue milrinone 0.375, would not wean.  - Wean NE first followed by epinephrine gradually, when these meds have been weaned down, would start sildenafil.  5. Acute on chronic hypoxemic respiratory failure: Intubated post-op.  Baseline COPD + OSA + ?OHS and on home oxygen 4 liters. Now with post-op volume overload as well. Continue IV lasix - Vent wean per CCM.  6. ID: Cultures including tissue culture from valve so far negative.  9/20 MSSA discitis.  Concern for possible distributive shock component.  - He is on vancomcycin/cefepime.  7. AKI on CKD stage 3: Creatinine higher today at 1.4.--> 1.6  - Follow with diuresis.  8. Atrial fibrillation: Paroxysmal.  On Tikosyn pre-op.  Had Maze procedure, now  A-V sequentially paced.  - Will need eventual anticoagulation, timing per surgery.    Remove femoral a line and obtain EKG.  Length of Stay: 4  Amy Clegg, NP  09/10/2021, 7:12 AM  Advanced Heart Failure Team Pager [phone] (M-F; 7a - 5p)  Please contact [facility] for night-coverage after hours (5p -7a ) and weekends on [email or web address]  Patient seen with NP, agree with the above note.   Echo yesterday showed EF 40-45%, normal RV, stable bioprosthetic AoV, PA systolic pressure not elevated.   Swan numbers with CVP 12 range, PA pressure 48/25, CI 4 with co-ox 73%.   Currently on NE 10, epinephrine 6, milrinone 0.375, iNO 30.  I/Os even with Lasix yesterday.  BP very labile, currently quite high.   General: NAD Neck: Thick, JVP 10-12 cm, no thyromegaly or thyroid nodule.  Lungs: Decreased BS at bases.  CV: Nondisplaced PMI.  Heart regular S1/S2, no S3/S4, no murmur.  Trace ankle edema.   Abdomen: Soft, nontender, no hepatosplenomegaly, no distention.  Skin: Intact without lesions or rashes.  Neurologic: Alert and oriented x 3.  Psych: Normal affect. Extremities: No clubbing or cyanosis.  HEENT: Normal.   We should be able to wean NE/epinephrine today (BP elevated).  D/c femoral arterial line (has radial arterial line).  Continue diuresis today, Lasix 40 mg IV bid.  Continue iNO and milrinone for now, PA pressure is controlled.   Continues on cefepime/vancomycin for empiric coverage.   Rhythm regular, do not see definite P waves.  Will get ECG today.  Pacing set to AAI.  CRITICAL CARE Performed by: Loralie Champagne  Total critical care time: 40 minutes  Critical care time was exclusive of separately billable procedures and treating other patients.  Critical care was necessary to treat or prevent imminent or life-threatening deterioration.  Critical care was time spent personally by me on the following activities: development of treatment plan with patient and/or surrogate as well as nursing, discussions with consultants, evaluation of patient's response to treatment, examination of patient, obtaining history from patient or surrogate, ordering and performing treatments and interventions, ordering and review of laboratory studies, ordering and review of radiographic studies, pulse oximetry and re-evaluation of patient's condition.  Loralie Champagne 09/10/2021 7:50 AM

## 2021-09-11 ENCOUNTER — Other Ambulatory Visit (HOSPITAL_COMMUNITY): Payer: Self-pay

## 2021-09-11 ENCOUNTER — Encounter (HOSPITAL_COMMUNITY): Payer: Medicare Other

## 2021-09-11 ENCOUNTER — Ambulatory Visit: Payer: Medicare Other

## 2021-09-11 ENCOUNTER — Inpatient Hospital Stay (HOSPITAL_COMMUNITY): Payer: Medicare Other

## 2021-09-11 DIAGNOSIS — R570 Cardiogenic shock: Secondary | ICD-10-CM | POA: Diagnosis not present

## 2021-09-11 DIAGNOSIS — I351 Nonrheumatic aortic (valve) insufficiency: Secondary | ICD-10-CM | POA: Diagnosis not present

## 2021-09-11 LAB — BASIC METABOLIC PANEL
Anion gap: 8 (ref 5–15)
Anion gap: 5 (ref 5–15)
BUN: 41 mg/dL — ABNORMAL HIGH (ref 8–23)
BUN: 43 mg/dL — ABNORMAL HIGH (ref 8–23)
CO2: 23 mmol/L (ref 22–32)
CO2: 23 mmol/L (ref 22–32)
Calcium: 8.8 mg/dL — ABNORMAL LOW (ref 8.9–10.3)
Calcium: 8.6 mg/dL — ABNORMAL LOW (ref 8.9–10.3)
Chloride: 109 mmol/L (ref 98–111)
Chloride: 112 mmol/L — ABNORMAL HIGH (ref 98–111)
Creatinine, Ser: 1.55 mg/dL — ABNORMAL HIGH (ref 0.61–1.24)
Creatinine, Ser: 1.57 mg/dL — ABNORMAL HIGH (ref 0.61–1.24)
GFR, Estimated: 48 mL/min — ABNORMAL LOW (ref >60)
GFR, Estimated: 47 mL/min — ABNORMAL LOW (ref >60)
Glucose, Bld: 114 mg/dL — ABNORMAL HIGH (ref 70–99)
Glucose, Bld: 131 mg/dL — ABNORMAL HIGH (ref 70–99)
Potassium: 4.2 mmol/L (ref 3.5–5.1)
Potassium: 4.1 mmol/L (ref 3.5–5.1)
Sodium: 140 mmol/L (ref 135–145)
Sodium: 140 mmol/L (ref 135–145)

## 2021-09-11 LAB — LUPUS ANTICOAGULANT
DRVVT: 38 s (ref 0.0–47.0)
PTT Lupus Anticoagulant: 44.3 s (ref 0.0–51.9)
Thrombin Time: 14.3 s (ref 0.0–23.0)
dPT Confirm Ratio: 0.98 Ratio (ref 0.00–1.34)
dPT: 37.2 s (ref 0.0–47.6)

## 2021-09-11 LAB — GLUCOSE, CAPILLARY
Glucose-Capillary: 104 mg/dL — ABNORMAL HIGH (ref 70–99)
Glucose-Capillary: 121 mg/dL — ABNORMAL HIGH (ref 70–99)
Glucose-Capillary: 133 mg/dL — ABNORMAL HIGH (ref 70–99)
Glucose-Capillary: 145 mg/dL — ABNORMAL HIGH (ref 70–99)
Glucose-Capillary: 150 mg/dL — ABNORMAL HIGH (ref 70–99)
Glucose-Capillary: 163 mg/dL — ABNORMAL HIGH (ref 70–99)
Glucose-Capillary: 99 mg/dL (ref 70–99)

## 2021-09-11 LAB — EXTRACTABLE NUCLEAR ANTIGEN ANTIBODY
ENA SM Ab Ser-aCnc: <0.2 AI (ref 0.0–0.9)
Ribonucleic Protein: <0.2 AI (ref 0.0–0.9)
SSA (Ro) (ENA) Antibody, IgG: <0.2 AI (ref 0.0–0.9)
SSB (La) (ENA) Antibody, IgG: <0.2 AI (ref 0.0–0.9)
Scleroderma (Scl-70) (ENA) Antibody, IgG: <0.2 AI (ref 0.0–0.9)
ds DNA Ab: <1 IU/mL (ref 0–9)

## 2021-09-11 LAB — COOXEMETRY PANEL
Carboxyhemoglobin: 1.5 % (ref 0.5–1.5)
Methemoglobin: 1.3 % (ref 0.0–1.5)
O2 Saturation: 72.1 %
Total hemoglobin: 13.4 g/dL (ref 12.0–16.0)

## 2021-09-11 LAB — CBC
HCT: 33.3 % — ABNORMAL LOW (ref 39.0–52.0)
Hemoglobin: 10.5 g/dL — ABNORMAL LOW (ref 13.0–17.0)
MCH: 29.7 pg (ref 26.0–34.0)
MCHC: 31.5 g/dL (ref 30.0–36.0)
MCV: 94.3 fL (ref 80.0–100.0)
Platelets: 91 10*3/uL — ABNORMAL LOW (ref 150–400)
RBC: 3.53 MIL/uL — ABNORMAL LOW (ref 4.22–5.81)
RDW: 16 % — ABNORMAL HIGH (ref 11.5–15.5)
WBC: 8.5 10*3/uL (ref 4.0–10.5)
nRBC: 0 % (ref 0.0–0.2)

## 2021-09-11 LAB — AEROBIC/ANAEROBIC CULTURE W GRAM STAIN (SURGICAL/DEEP WOUND): Culture: NO GROWTH

## 2021-09-11 LAB — ANTI-JO 1 ANTIBODY, IGG: Anti JO-1: <0.2 AI (ref 0.0–0.9)

## 2021-09-11 LAB — ANA W/REFLEX IF POSITIVE: Anti Nuclear Antibody (ANA): NEGATIVE

## 2021-09-11 LAB — RHEUMATOID FACTOR: Rheumatoid fact SerPl-aCnc: <10 IU/mL (ref <14.0)

## 2021-09-11 LAB — VANCOMYCIN, TROUGH: Vancomycin Tr: 17 ug/mL (ref 15–20)

## 2021-09-11 LAB — MAGNESIUM: Magnesium: 2.1 mg/dL (ref 1.7–2.4)

## 2021-09-11 LAB — CYCLIC CITRUL PEPTIDE ANTIBODY, IGG/IGA: CCP Antibodies IgG/IgA: 3 units (ref 0–19)

## 2021-09-11 LAB — PHOSPHORUS: Phosphorus: 4.6 mg/dL (ref 2.5–4.6)

## 2021-09-11 MED ORDER — AMIODARONE HCL IN DEXTROSE 360-4.14 MG/200ML-% IV SOLN
60.0000 mg/h | INTRAVENOUS | Status: AC
  Administered 2021-09-11 (×2): 60 mg/h via INTRAVENOUS
  Filled 2021-09-11 (×2): qty 200

## 2021-09-11 MED ORDER — CHLORHEXIDINE GLUCONATE 0.12 % MT SOLN
OROMUCOSAL | Status: AC
  Administered 2021-09-11: 15 mL via OROMUCOSAL
  Filled 2021-09-11: qty 15

## 2021-09-11 MED ORDER — AMIODARONE HCL IN DEXTROSE 360-4.14 MG/200ML-% IV SOLN
30.0000 mg/h | INTRAVENOUS | Status: DC
  Administered 2021-09-11 – 2021-09-17 (×12): 30 mg/h via INTRAVENOUS
  Filled 2021-09-11 (×13): qty 200

## 2021-09-11 MED ORDER — VANCOMYCIN HCL IN DEXTROSE 1-5 GM/200ML-% IV SOLN
1000.0000 mg | INTRAVENOUS | Status: DC
Start: 2021-09-11 — End: 2021-09-12
  Administered 2021-09-11: 1000 mg via INTRAVENOUS
  Filled 2021-09-11: qty 200

## 2021-09-11 MED ORDER — AMIODARONE LOAD VIA INFUSION
150.0000 mg | Freq: Once | INTRAVENOUS | Status: AC
  Administered 2021-09-11: 150 mg via INTRAVENOUS
  Filled 2021-09-11: qty 83.34

## 2021-09-11 NOTE — Progress Notes (Addendum)
Pt awake and agitated. Son at bedside.

## 2021-09-11 NOTE — Progress Notes (Addendum)
Advanced Heart Failure Rounding Note  PCP-Cardiologist: [name], MD   Subjective:   9/11: Echo EF 40-45% RV ok. RVSP 22.8   Remains intubated FiO2 40%  PEEP 12  Remains on Norepi 7, epi 6, milrinone 0.375 mcg, iNO 30 ppm.   [place] #s  CVP 13 PA 47/25 PVR 81 SVR 481  CO 10.8 CI 4.2 CO-OX 72.1%   Weight down 5 pounds with net I/O of -1.67 L.  Robust UOP with 5.2L over 24 hours.    Objective:   Weight Range: (!) 144 kg Body mass index is 41.88 kg/m.   Vital Signs:   Temp:  [98.1 F (36.7 C)-99.1 F (37.3 C)] 98.4 F (36.9 C) (09/13 0620) Pulse Rate:  [105-114] 106 (09/13 0300) Resp:  [0-30] 30 (09/13 0620) BP: (86-149)/(49-93) 112/68 (09/13 0600) SpO2:  [93 %-98 %] 97 % (09/13 0620) Arterial Line BP: (66-213)/(31-87) 148/61 (09/13 0620) FiO2 (%):  [40 %] 40 % (09/13 0300) Weight:  [144 kg] 144 kg (09/13 0500) Last BM Date: 09/05/21  Weight change: Filed Weights   09/09/21 0100 09/10/21 0456 09/11/21 0500  Weight: (!) 143 kg (!) 146.1 kg (!) 144 kg    Intake/Output:   Intake/Output Summary (Last 24 hours) at 09/11/2021 0730 Last data filed at 09/11/2021 0643 Gross per 24 hour  Intake 3581.31 ml  Output 5255 ml  Net -1673.69 ml       Physical Exam   CVP 13 General:  Intubated/sedated HEENT: ETT Neck: Supple. JVP difficult to assess.  Carotids 2+ bilat; no bruits. No lymphadenopathy or thyromegaly appreciated. RIJ swan  Cor: PMI nondisplaced. Regular rate & rhythm. No rubs, gallops or murmurs. Lungs: Coarse in bases  Abdomen: Soft, nontender, nondistended. No hepatosplenomegaly. No bruits or masses. Good bowel sounds. Extremities: No cyanosis, clubbing, rash, edema. L radial arterial line  Neuro: Opens eyes. MAE x2, follows commands.  GU: foley  Telemetry   Afib vs a paced in 90s   EKG    N/A  Labs    CBC Recent Labs    09/10/21 0600 09/11/21 0329  WBC 10.7* 8.5  HGB 10.7* 10.5*  HCT 35.1* 33.3*  MCV 95.6 94.3  PLT 84* 91*     Basic Metabolic Panel Recent Labs    09/10/21 0600 09/10/21 1620 09/11/21 0329  NA 141 140 140  K 4.3 4.3 4.2  CL 112* 110 109  CO2 22 22 23   GLUCOSE 145* 123* 114*  BUN 36* 39* 41*  CREATININE 1.58* 1.59* 1.55*  CALCIUM 8.7* 8.7* 8.8*  MG 2.3  --  2.1  PHOS 4.9*  --  4.6    Liver Function Tests No results for input(s): AST, ALT, ALKPHOS, BILITOT, PROT, ALBUMIN in the last 72 hours.  No results for input(s): LIPASE, AMYLASE in the last 72 hours. Cardiac Enzymes No results for input(s): CKTOTAL, CKMB, CKMBINDEX, TROPONINI in the last 72 hours.  BNP: BNP (last 3 results) No results for input(s): BNP in the last 8760 hours.  ProBNP (last 3 results) No results for input(s): PROBNP in the last 8760 hours.   D-Dimer No results for input(s): DDIMER in the last 72 hours. Hemoglobin A1C No results for input(s): HGBA1C in the last 72 hours. Fasting Lipid Panel No results for input(s): CHOL, HDL, LDLCALC, TRIG, CHOLHDL, LDLDIRECT in the last 72 hours. Thyroid Function Tests No results for input(s): TSH, T4TOTAL, T3FREE, THYROIDAB in the last 72 hours.  Invalid input(s): FREET3  Other results:   Imaging  DG Abd Portable 1V  Result Date: 09/10/2021 CLINICAL DATA:  Feeding tube EXAM: PORTABLE ABDOMEN - 1 VIEW COMPARISON:  None. FINDINGS: The bowel gas pattern is normal. Enteric feeding tube projects over the expected area of the pylorus. No radio-opaque calculi or other significant radiographic abnormality are seen. Partially visualized lung bases demonstrate a PA catheter, cardiomegaly and mild pulmonary edema. IMPRESSION: Enteric feeding tube projects over the expected area of the pylorus. Electronically Signed   By: Yetta Glassman M.D.   On: 09/10/2021 13:01     Medications:     Scheduled Medications:  acetaminophen  1,000 mg Oral Q6H   Or   acetaminophen (TYLENOL) oral liquid 160 mg/5 mL  1,000 mg Per Tube Q6H   arformoterol  15 mcg Nebulization BID    aspirin EC  325 mg Oral Daily   Or   aspirin  324 mg Per Tube Daily   atorvastatin  80 mg Per Tube Daily   bisacodyl  10 mg Oral Daily   Or   bisacodyl  10 mg Rectal Daily   brimonidine  1 drop Both Eyes TID   busPIRone  15 mg Per Tube BID   chlorhexidine gluconate (MEDLINE KIT)  15 mL Mouth Rinse BID   Chlorhexidine Gluconate Cloth  6 each Topical Daily   clonazePAM  1 mg Per Tube BID   docusate  200 mg Per Tube Daily   feeding supplement (PROSource TF)  90 mL Per Tube TID   feeding supplement (VITAL 1.5 CAL)  1,000 mL Per Tube Q24H   fentaNYL (SUBLIMAZE) injection  25 mcg Intravenous Once   fluticasone  2 spray Each Nare Daily   furosemide  40 mg Intravenous BID   insulin aspart  0-20 Units Subcutaneous Q4H   insulin detemir  30 Units Subcutaneous BID   mouth rinse  15 mL Mouth Rinse 10 times per day   metoCLOPramide (REGLAN) injection  10 mg Intravenous Once   pantoprazole sodium  40 mg Per Tube Daily   PARoxetine  60 mg Per Tube Daily   polyethylene glycol  17 g Per Tube Daily   revefenacin  175 mcg Nebulization Daily   senna  1 tablet Per Tube Daily   sodium chloride flush  3 mL Intravenous Q12H    Infusions:  sodium chloride 20 mL/hr at 09/11/21 0643   sodium chloride     sodium chloride     cefTRIAXone (ROCEPHIN)  IV Stopped (09/10/21 1811)   epinephrine 6 mcg/min (09/11/21 1287)   fentaNYL infusion INTRAVENOUS 175 mcg/hr (09/11/21 0625)   lactated ringers 20 mL/hr at 09/11/21 0643   lactated ringers 20 mL/hr at 09/10/21 1517   midazolam 1 mg/hr (09/11/21 0643)   milrinone 0.375 mcg/kg/min (09/11/21 0643)   nitroGLYCERIN     norepinephrine (LEVOPHED) Adult infusion 7 mcg/min (09/11/21 8676)   vancomycin Stopped (09/10/21 1210)    PRN Medications: sodium chloride, dextrose, fentaNYL, levalbuterol, midazolam, morphine injection, ondansetron (ZOFRAN) IV, sodium chloride flush    Patient Profile   70 y.o. with history of COPD/smoking, chronic severe aortic  insufficiency, paroxysmal atrial fibrillation, CAD, type 2 diabetes, and CKD stage 3.  Patient had PCI to LAD in 12/03, LHC in 7/22 showed only mild CAD.  He has paroxysmal atrial fibrillation and had been maintained on Tikosyn.  He had not been ablated due to obesity.  Patient had lumbar discitis and left sternoclavicular joint infection in 9/20, had MSSA.  He had I&D left [place] joint and IV abx.  Echo at the time showed no vegetation and trivial AI.    HF team consulted for shock/pulmonary hypertension.   Assessment/Plan   1. Aortic insufficiency: TEE pre-op with severe AI, tricuspid aortic valve.  The LV was moderately dilated.  Now s/p bioprosthetic AVR.  The native valve was perforated with possible vegetation.  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  He had MSSA discitis with prolonged course back in 9/20.  Uncertain whether valve changes were chronic or more acute/subacute.  Valve tissue cultures NGTD so far.  2. Shock: Post-op.  Suspect mixed picture with RV dysfunction/severe pulmonary hypertension as well as possible distributive component.  Remains afebrile.  Titrating down pressors.  He remains on iNO 30 ppm, epinephrine 6, NE 7, milrinone 0.375. CO-OX 72.1%, CI 4.2.  SVR remains low, possible distributive shock component.  - Wean NE first, then slowly epinephrine.  Decrease sedation as tolerated to facilitate weaning. BP labile, however, so weaning has been slow  - Would not change milrinone and iNO.  - Covering for possible sepsis picture with Rocephin/vancomycin.  3.  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  Post-op echo with LV down 40-45% (post-correction of severe AI), normal RV, normal bioprosthetic aortic valve. Post-op volume overload, CVP 13. Continue IV lasix. I/Os net negative 1674 on this regimen yesterday, creatinine stable 1.55.  -  Continue Lasix  40 mg IV bid and follow response.  4.  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  Has significant lung parenchymal disease, CTA chest in 6/22 with emphysema as well as areas of fibrosis, as well as OSA and ?OHS.  Pre-op was on CPAP and 4L home oxygen.  Initially, peri-operative PA pressure was very high, in the 90/45 range   - PA pressure today 40s/20s -- pressures have been stable in 40s-50s/20s  - Continue iNO 30 ppm for now, would not wean yet.  - Continue milrinone 0.375, would not wean.  - Wean NE first followed by epinephrine gradually, when these meds have been weaned down, would start sildenafil.  5. Acute on chronic hypoxemic respiratory failure: Intubated post-op.  Baseline COPD + OSA + ?OHS and on home oxygen 4 liters. Now with post-op volume overload as well. Continue IV lasix - Vent wean per CCM.  Garlon Hatchet and Yupelri ordered  6. ID: Cultures including tissue culture from valve so far negative.  9/20 MSSA discitis.  ID following. Concern for possible distributive shock component. Blood cultures from 9/9 with NGTD. WBC trending down, afebrile  - He is on vancomcycin/Rocephin.   7. AKI on CKD stage 3: Creatinine stable today at 1.55 (was 1.6)  - Follow with diuresis.  8. Atrial fibrillation: Paroxysmal.  On Tikosyn pre-op.  Had Maze procedure.  Will obtain EKG today to determine whether rhythm is truly afib noted on telemetry  - Will need eventual anticoagulation, timing per surgery.   Length of Stay: [place], NP  09/11/2021, 7:30 AM  Advanced Heart Failure Team Pager [phone] (M-F; 7a - 5p)  Please contact [facility] for night-coverage after hours (5p -7a ) and weekends on [email or web address]  Patient seen with NP, agree with the above note.   This morning, he remains on NE 6, epinephrine 6, milrinone 0.375, and iNO 30 ppm.  Pressor weaning has been slow with labile BP, but BP is quite high when he is  less sedated.  PA pressure is  controlled.   Good UOP yesterday with IV Lasix, I/Os negative. CVP 13 today.    He is in atrial fibrillation with mild RVR.   General: Intubated, awake.  Neck: JVP 10 cm, no thyromegaly or thyroid nodule.  Lungs: Clear to auscultation bilaterally with normal respiratory effort. CV: Nondisplaced PMI.  Heart mildly tachy, irregular S1/S2, no S3/S4, no murmur.  Trace ankle edema.  Abdomen: Soft, nontender, no hepatosplenomegaly, no distention.  Skin: Intact without lesions or rashes.  Neurologic: Follows commands.  Extremities: No clubbing or cyanosis.  HEENT: Normal.   Hopefully will be able to wean NE and epinephrine today.  Would try to keep sedation minimized to help with weaning. Will keep milrinone and iNO unchanged for now, PA pressures controlled.  When pressors weaned, will add sildenafil to help [place] come off NO.   Weaning of vent per CCM.  Will give Lasix 40 mg IV bid again today to keep I/Os gently negative.   He is back in atrial fibrillation.  Will start IV amiodarone for now in the hospital to control rate.  Was on Tikosyn at home.  Had Maze, will need to start anticoagulation when stable for this per surgery and eventual DCCV prior to discharge if stays in fibrillation. Discussed with EP, he has been off Tikosyn long enough to use amiodarone.   ?Component of distributive/septic shock.  Remains on vancomycin/ceftriaxone.  ID following.   Aortic valve showed perforation and vegetation (?chronic changes).  Tissue cultures negative.  No known active infection since 2020.  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   CRITICAL CARE Performed by: Loralie Champagne  Total critical care time: 40 minutes  Critical care time was exclusive of separately billable procedures and treating other patients.  Critical care was necessary to treat or prevent imminent or life-threatening deterioration.  Critical care was  time spent personally by me on the following activities: development of treatment plan with patient and/or surrogate as well as nursing, discussions with consultants, evaluation of patient's response to treatment, examination of patient, obtaining history from patient or surrogate, ordering and performing treatments and interventions, ordering and review of laboratory studies, ordering and review of radiographic studies, pulse oximetry and re-evaluation of patient's condition.  Loralie Champagne 09/11/2021 8:31 AM

## 2021-09-11 NOTE — Progress Notes (Signed)
Notified by lab tech earlier in shift that purple top lab tube of blood needed for [facility] send out labs for "misc" labs.  2 different labels printed, lab unable to identify which label needs to be placed on tube. Per tech purple top lab tube sent down with patient label on tube. Time, date, and number on lab tube.

## 2021-09-11 NOTE — Progress Notes (Signed)
EVENING ROUNDS NOTE :     [address]       [place] [address]             [phone]                 5 Days Post-Op Procedure(s) (LRB): AORTIC VALVE REPLACEMENT (AVR) USING INSPIRIS AORTIC VALVE 27 MM (N/A) MAZE (N/A) TRANSESOPHAGEAL ECHOCARDIOGRAM (TEE) (N/A) APPLICATION OF CELL SAVER CLIPPING OF ATRIAL APPENDAGE USING ATRICLIP PRO 240   Total Length of Stay:  LOS: 5 days  Events:   Labile BP.  Remains on epi, levo, milr, and iNO No major events    BP 133/63   Pulse (!) 102   Temp 98.8 F (37.1 C)   Resp (!) 30   Ht '6\' 1"'$  (1.854 m)   Wt (!) 144 kg   SpO2 96%   BMI 41.88 kg/m   PAP: (37-61)/(19-35) 44/27 CVP:  [6 mmHg-22 mmHg] 8 mmHg PCWP:  [23 mmHg-29 mmHg] 29 mmHg CO:  [6.1 L/min-8.8 L/min] 8.8 L/min CI:  [2.4 L/min/m2-3.4 L/min/m2] 3.4 L/min/m2  Vent Mode: PRVC FiO2 (%):  [40 %] 40 % Set Rate:  [30 bmp] 30 bmp Vt Set:  [630 mL] 630 mL PEEP:  [8 [id number] [name]20] 8 [name]20 Plateau Pressure:  [25 [name]20-26 [name]20] 26 [name]20   sodium chloride Stopped (09/11/21 1248)   sodium chloride     sodium chloride     amiodarone 30 mg/hr (09/11/21 1524)   cefTRIAXone (ROCEPHIN)  IV Stopped (09/10/21 1811)   epinephrine 6 mcg/min (09/11/21 1400)   fentaNYL infusion INTRAVENOUS Stopped (09/11/21 1350)   lactated ringers 20 mL/hr at 09/11/21 1400   lactated ringers 20 mL/hr at 09/10/21 1517   midazolam Stopped (09/11/21 0944)   milrinone 0.375 mcg/kg/min (09/11/21 1400)   nitroGLYCERIN     norepinephrine (LEVOPHED) Adult infusion 8 mcg/min (09/11/21 1400)   vancomycin      I/O last 3 completed shifts: In: 5308.2 [I.V.:3790.9; NG/GT:967.4; IV Piggyback:549.9] Out: LY:[id number]; Chest Tube:40]   CBC Latest Ref Rng & Units 09/11/2021 09/10/2021 09/10/2021  WBC 4.0 - 10.5 K/uL 8.5 10.7(H) -  Hemoglobin 13.0 - 17.0 g/dL 10.5(L) 10.7(L) 10.5(L)  Hematocrit 39.0 - 52.0 % 33.3(L) 35.1(L) 31.0(L)  Platelets 150 - 400 K/uL 91(L) 84(L) -    BMP Latest  Ref Rng & Units 09/11/2021 09/10/2021 09/10/2021  Glucose 70 - 99 mg/dL 114(H) 123(H) 145(H)  BUN 8 - 23 mg/dL 41(H) 39(H) 36(H)  Creatinine 0.61 - 1.24 mg/dL 1.55(H) 1.59(H) 1.58(H)  BUN/Creat Ratio 10 - 24 - - -  Sodium 135 - 145 mmol/L 140 140 141  Potassium 3.5 - 5.1 mmol/L 4.2 4.3 4.3  Chloride 98 - 111 mmol/L 109 110 112(H)  CO2 22 - 32 mmol/L '23 22 22  '$ Calcium 8.9 - 10.3 mg/dL 8.8(L) 8.7(L) 8.7(L)    ABG    Component Value Date/Time   PHART 7.290 (L) 09/10/2021 0334   PCO2ART 46.7 09/10/2021 0334   PO2ART 85 09/10/2021 0334   HCO3 22.4 09/10/2021 0334   TCO2 24 09/10/2021 0334   ACIDBASEDEF 4.0 (H) 09/10/2021 0334   O2SAT 72.1 09/11/2021 0329       Melodie Bouillon, MD 09/11/2021 4:22 PM

## 2021-09-11 NOTE — Progress Notes (Signed)
[facility]  Date of Admission:  09/16/2021   Total days of antibiotics 6        Day 2 Rocephin        Day 5 Vancomycin         ASSESSMENT: [name] [name]. Is a 70 yo male who is s/p aortic valve replacement 09/03/2021. 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  Patient is stable and currently on appropriate antibiotic therapy.   PLAN: Valve molecular testing- pending Autoimmune labs- pending Continue vancomycin and rocephin until causative organism is determined  Active Problems:   S/P AVR   Severe aortic regurgitation   Scheduled Meds:  acetaminophen  1,000 mg Oral Q6H   Or   acetaminophen (TYLENOL) oral liquid 160 mg/5 mL  1,000 mg Per Tube Q6H   arformoterol  15 mcg Nebulization BID   aspirin EC  325 mg Oral Daily   Or   aspirin  324 mg Per Tube Daily   atorvastatin  80 mg Per Tube Daily   bisacodyl  10 mg Oral Daily   Or   bisacodyl  10 mg Rectal Daily   brimonidine  1 drop Both Eyes TID   busPIRone  15 mg Per Tube BID   chlorhexidine gluconate (MEDLINE KIT)  15 mL Mouth Rinse BID   Chlorhexidine Gluconate Cloth  6 each Topical Daily   clonazePAM  1 mg Per Tube BID   docusate  200 mg Per Tube Daily   feeding supplement (PROSource TF)  90 mL Per Tube TID   feeding supplement (VITAL 1.5 CAL)  1,000 mL Per Tube Q24H   fentaNYL (SUBLIMAZE) injection  25 mcg Intravenous Once   fluticasone  2 spray Each Nare Daily   furosemide  40 mg Intravenous BID   insulin aspart  0-20 Units Subcutaneous Q4H   insulin detemir  30 Units Subcutaneous BID   mouth rinse  15 mL Mouth Rinse 10 times per day   metoCLOPramide (REGLAN) injection  10 mg Intravenous Once   pantoprazole sodium  40 mg Per Tube Daily   PARoxetine  60 mg Per Tube Daily   polyethylene glycol  17 g Per Tube Daily   revefenacin  175 mcg Nebulization Daily   senna  1 tablet Per Tube Daily   sodium chloride flush  3 mL Intravenous Q12H   Continuous  Infusions:  sodium chloride 20 mL/hr at 09/11/21 1200   sodium chloride     sodium chloride     amiodarone 60 mg/hr (09/11/21 1223)   Followed by   amiodarone     cefTRIAXone (ROCEPHIN)  IV Stopped (09/10/21 1811)   epinephrine 6 mcg/min (09/11/21 1200)   fentaNYL infusion INTRAVENOUS Stopped (09/11/21 1103)   lactated ringers 20 mL/hr at 09/11/21 1200   lactated ringers 20 mL/hr at 09/10/21 1517   midazolam Stopped (09/11/21 0944)   milrinone 0.375 mcg/kg/min (09/11/21 1200)   nitroGLYCERIN     norepinephrine (LEVOPHED) Adult infusion 7 mcg/min (09/11/21 1200)   vancomycin Stopped (09/11/21 0957)   PRN Meds:.sodium chloride, dextrose, fentaNYL, levalbuterol, midazolam, morphine injection, ondansetron (ZOFRAN) IV, sodium chloride flush   SUBJECTIVE:  Review of Systems:   Allergies  Allergen Reactions   Pseudoephedrine Other (See Comments)    Patient went into afib   Diltiazem Rash and Itching    Also 2019   Novocain [Procaine] Hives    Dentist appointment in 1958; since then has tolerated  lidocaine and provocaine with no hives or difficulty.   Quinolones     Patient was warned about not using Cipro and similar antibiotics. Recent studies have raised concern that fluoroquinolone antibiotics could be associated with an increased risk of aortic aneurysm Fluoroquinolones have non-antimicrobial properties that might jeopardise the integrity of the extracellular matrix of the vascular wall In a  propensity score matched cohort study in [place], there was a 66% increased rate of aortic aneurysm or dissection associated with oral fluoroquinolone use, compared wit   Sulfonamide Derivatives Other (See Comments)    Childhood reaction     OBJECTIVE: Vitals:   09/11/21 1115 09/11/21 1122 09/11/21 1130 09/11/21 1207  BP:    106/66  Pulse:    (!) 102  Resp: (!) 30 (!) 30 (!) 30 (!) 30  Temp: 99 F (37.2 C) 99 F (37.2 C) 99 F (37.2 C)   TempSrc:      SpO2: 96% 96% 95% 96%   Weight:      Height:       Body mass index is 41.88 kg/m.  Lab Results Lab Results  Component Value Date   WBC 8.5 09/11/2021   HGB 10.5 (L) 09/11/2021   HCT 33.3 (L) 09/11/2021   MCV 94.3 09/11/2021   PLT 91 (L) 09/11/2021    Lab Results  Component Value Date   CREATININE 1.55 (H) 09/11/2021   BUN 41 (H) 09/11/2021   NA 140 09/11/2021   K 4.2 09/11/2021   CL 109 09/11/2021   CO2 23 09/11/2021    Lab Results  Component Value Date   ALT 14 09/08/2021   AST 61 (H) 09/08/2021   ALKPHOS 44 09/08/2021   BILITOT 1.0 09/08/2021     Microbiology: Recent Results (from the past 240 hour(s))  Surgical pcr screen     Status: None   Collection Time: 09/04/21 10:48 AM   Specimen: Nasal Mucosa; Nasal Swab  Result Value Ref Range Status   MRSA, PCR NEGATIVE NEGATIVE Final   Staphylococcus aureus NEGATIVE NEGATIVE Final    Comment: (NOTE) The Xpert SA Assay (FDA approved for NASAL specimens in patients 8 years of age and older), is one component of a comprehensive surveillance program. It is not intended to diagnose infection nor to guide or monitor treatment. Performed at [facility], [place] [address] [place], [place] [address]   SARS CORONAVIRUS 2 (TAT 6-24 HRS) Nasopharyngeal Nasopharyngeal Swab     Status: None   Collection Time: 09/04/21 10:49 AM   Specimen: Nasopharyngeal Swab  Result Value Ref Range Status   SARS Coronavirus 2 NEGATIVE NEGATIVE Final    Comment: (NOTE) SARS-CoV-2 target nucleic acids are NOT DETECTED.  The SARS-CoV-2 RNA is generally detectable in upper and lower respiratory specimens during the acute phase of infection. Negative results do not preclude SARS-CoV-2 infection, do not rule out co-infections with other pathogens, and should not be used as the sole basis for treatment or other patient management decisions. Negative results must be combined with clinical observations, patient history, and epidemiological information. The  expected result is Negative.  Fact Sheet for Patients: SugarRoll.be  Fact Sheet for Healthcare Providers: https://www.woods-mathews.com/  This test is not yet approved or cleared by the [place] FDA and  has been authorized for detection and/or diagnosis of SARS-CoV-2 by FDA under an Emergency Use Authorization (EUA). This EUA will remain  in effect (meaning this test can be used) for the duration of the COVID-19 declaration under Se ction  564(b)(1) of the Act, 21 U.S.C. section 360bbb-3(b)(1), unless the authorization is terminated or revoked sooner.  Performed at [facility], [place] [address], [place], [place] [address]   Fungus Culture With Stain     Status: None (Preliminary result)   Collection Time: 09/19/2021 12:34 PM   Specimen: Aortic Valve Leaflets; Tissue  Result Value Ref Range Status   Fungus Stain Final report  Final    Comment: (NOTE) Performed At: [facility], [place] [id number] Rush Farmer MD [id number]    Fungus (Mycology) Culture PENDING  Incomplete   Fungal Source TISSUE  Final    Comment: AORTIC VALVE LEAFLET Performed at [facility], [place] [address], [place], [place] [address]   Aerobic/Anaerobic Culture w Gram Stain (surgical/deep wound)     Status: None (Preliminary result)   Collection Time: 09/10/2021 12:34 PM   Specimen: Aortic Valve Leaflets; Tissue  Result Value Ref Range Status   Specimen Description TISSUE  Final   Special Requests AORTIC VALVE LEAFLETS  Final   Gram Stain   Final    MODERATE WBC PRESENT, PREDOMINANTLY MONONUCLEAR NO ORGANISMS SEEN    Culture   Final    NO GROWTH 4 DAYS NO ANAEROBES ISOLATED; CULTURE IN PROGRESS FOR 5 DAYS Performed at [facility], [address], [place], [place] [address]    Report Status PENDING  Incomplete  Acid Fast Smear (AFB)     Status: None   Collection Time: 09/28/2021 12:34 PM   Specimen: Aortic  Valve Leaflets; Tissue  Result Value Ref Range Status   AFB Specimen Processing Concentration  Final   Acid Fast Smear Negative  Final    Comment: (NOTE) Performed At: [facility] [place], [place] [id number] Rush Farmer MD [id number]    Source (AFB) TISSUE  Final    Comment: AORTIC VALVE LEAFLET Performed at [facility], [place] [address] [place], [place] [address]   Fungus Culture Result     Status: None   Collection Time: 09/05/2021 12:34 PM  Result Value Ref Range Status   Result 1 Comment  Final    Comment: (NOTE) KOH/Calcofluor preparation:  no fungus observed. Performed At: [facility] [place], [place] [id number] Rush Farmer MD [id number]   Culture, blood (routine x 2)     Status: None (Preliminary result)   Collection Time: 09/07/21  9:00 AM   Specimen: BLOOD LEFT HAND  Result Value Ref Range Status   Specimen Description BLOOD LEFT HAND  Final   Special Requests   Final    BOTTLES DRAWN AEROBIC ONLY Blood Culture adequate volume   Culture   Final    NO GROWTH 4 DAYS Performed at [facility], [address] [place], [place] [address]    Report Status PENDING  Incomplete  Culture, blood (routine x 2)     Status: None (Preliminary result)   Collection Time: 09/07/21  9:12 AM   Specimen: BLOOD RIGHT HAND  Result Value Ref Range Status   Specimen Description BLOOD RIGHT HAND  Final   Special Requests   Final    BOTTLES DRAWN AEROBIC ONLY Blood Culture adequate volume   Culture   Final    NO GROWTH 4 DAYS Performed at [facility], [place] [address], [place], [place] [address]    Report Status PENDING  Incomplete    PORTABLE CHEST 1 VIEW   COMPARISON:  09/09/2021  FINDINGS: Postsurgical changes are again seen. Endotracheal tube and feeding catheter are noted. Swan-Ganz catheter is noted in the pulmonary outflow tract. 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  Mediastinal drain is been removed in the interval. Lungs are well aerated bilaterally. Mild central vascular congestion is noted without effusion or focal infiltrate.   IMPRESSION: Mild vascular congestion.   Postoperative changes with tubes and lines as described.    Timothy Lasso, MD PGY-1 Internal Medicine Resident [facility] [place] [phone] pager    09/11/2021, 1:35 PM

## 2021-09-11 NOTE — Progress Notes (Signed)
5 Days Post-Op Procedure(s) (LRB): AORTIC VALVE REPLACEMENT (AVR) USING INSPIRIS AORTIC VALVE 27 MM (N/A) MAZE (N/A) TRANSESOPHAGEAL ECHOCARDIOGRAM (TEE) (N/A) APPLICATION OF CELL SAVER CLIPPING OF ATRIAL APPENDAGE USING ATRICLIP PRO 240 Subjective:  Remains intubated and sedated on vent.  Hemodynamics stable overnight. On milrinone 0.375, epi 6, NE 7, NO 30 with CI 3, Co-ox 72. MAP has been 60's -80's. BP goes up when he arouses.  -1645 cc yesterday with diuretic. 5100 cc urine. Wt down 5 lbs.  No BM yet.  Objective: Vital signs in last 24 hours: Temp:  [98.1 F (36.7 C)-99.1 F (37.3 C)] 98.4 F (36.9 C) (09/13 0600) Pulse Rate:  [105-114] 106 (09/13 0300) Cardiac Rhythm: Junctional rhythm (09/13 0400) Resp:  [0-30] 30 (09/13 0600) BP: (86-164)/(49-93) 112/68 (09/13 0600) SpO2:  [93 %-98 %] 96 % (09/13 0600) Arterial Line BP: (66-213)/(31-87) 115/54 (09/13 0600) FiO2 (%):  [40 %] 40 % (09/13 0300) Weight:  [144 kg] 144 kg (09/13 0500)  Hemodynamic parameters for last 24 hours: PAP: (37-76)/(19-45) 44/28 CVP:  [8 mmHg-15 mmHg] 9 mmHg PCWP:  [23 mmHg-33 mmHg] 23 mmHg CO:  [6.1 L/min-11.6 L/min] 8.4 L/min CI:  [2.4 L/min/m2-4.5 L/min/m2] 3.3 L/min/m2  Intake/Output from previous day: 09/12 0701 - 09/13 0700 In: 3484.7 [I.V.:2392.3; NG/GT:642.4; IV Piggyback:449.9] Out: [id number] [Urine:5100; Chest Tube:30] Intake/Output this shift: Total I/O In: 1408.7 [I.V.:1078.7; NG/GT:330] Out: 2325 [Urine:2325]  General appearance: intubated and sedated on versed and fentanyl Neurologic: opens eyes to voice. Staying calm Heart: regular rate and rhythm, S1, S2 normal, no murmur Lungs: clear to auscultation bilaterally Abdomen: soft, non-tender; bowel sounds hypoactive Extremities: edema decreased from yesterday Wound: incision looks good.  Lab Results: Recent Labs    09/10/21 0600 09/11/21 0329  WBC 10.7* 8.5  HGB 10.7* 10.5*  HCT 35.1* 33.3*  PLT 84* 91*   BMET:   Recent Labs    09/10/21 1620 09/11/21 0329  NA 140 140  K 4.3 4.2  CL 110 109  CO2 22 23  GLUCOSE 123* 114*  BUN 39* 41*  CREATININE 1.59* 1.55*  CALCIUM 8.7* 8.8*    PT/INR: No results for input(s): LABPROT, INR in the last 72 hours. ABG    Component Value Date/Time   PHART 7.290 (L) 09/10/2021 0334   HCO3 22.4 09/10/2021 0334   TCO2 24 09/10/2021 0334   ACIDBASEDEF 4.0 (H) 09/10/2021 0334   O2SAT 72.1 09/11/2021 0329   CBG (last 3)  Recent Labs    09/10/21 2001 09/11/21 0007 09/11/21 0327  GLUCAP 128* 99 104*   CXR: stable pulmonary vascular congestion and chronic changes.  Assessment/Plan: S/P Procedure(s) (LRB): AORTIC VALVE REPLACEMENT (AVR) USING INSPIRIS AORTIC VALVE 27 MM (N/A) MAZE (N/A) TRANSESOPHAGEAL ECHOCARDIOGRAM (TEE) (N/A) APPLICATION OF CELL SAVER CLIPPING OF ATRIAL APPENDAGE USING ATRICLIP PRO 240  POD 5  Hemodynamics have been more stable. Continue milrinone, epi for RV support and wean NE as tolerated. He will need this support until he gets off vent. Dr. Aundra Dubin will decide about weaning the inotropes.  VDRF: CCM managing the vent and will decide about when to wean. Probably would like to get more volume off first.  Stage 3 CKD with baseline creat about 1.5 with postop acute kidney injury due to hypotension. Creat fairly stable. Continue diuresis.   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 Operative GS showed WBC but no organisms. Operative valve culture and BC negative so far.  Continue antibiotics for now.   Morbid obesity.   DM: glucose  under adequate control on Levemir and SSI.   Nutrition: tube feeds are at 30 cc with no residual. Tube is close to post-pyloric on xray yesterday. Should be able to advance up to goal and follow residuals.   LOS: 5 days    [name] [name] 09/11/2021

## 2021-09-11 NOTE — Progress Notes (Signed)
NAME:  [name] [name]., MRN:  [id number], DOB:  July 02, 1951, LOS: 5 ADMISSION DATE:  09/13/2021, CONSULTATION DATE:  09/07/21 REFERRING MD:  Dr. [name], CHIEF COMPLAINT:  Resp failure   History of Present Illness:  HPI obtained from medical chart review as patient is intubated and sedated on mechanical ventilation.   70 year old male with prior history of prior tobacco abuse (quit 2010), centrilobular emphysema, chronic hypoxic respiratory failure on home O2 (?4L [place]), pulmonary hypertension, OSA on CPAP, PAF, CAD w/ LAD stent 2003, HLD, MSSA sepsis c/b lumbar discitis and infected left sternoclavicular joint 08/2019 who recently had worsening exertional dyspnea, orthopnea, and fatigue.  Had TTE in 04/2021 which showed new moderate to severe AI with normal EF 55-60 and new dilation of LV cavity with trivial MR and mild TR, normal RV.  Subsequently, underwent TEE on 07/10/21 which shoed EF 55-60%, mild dilation of ascending aorta, and severe AI.  Underwent [facility]  07/16/21 which showed patent LAD stent, mild diffuse nonobstructive CAD, moderate pulmonary hypertension with PA 67/20 (39) and a pulmonary wedge pressure of 83mHg.  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  Found to have AV vegetations which where sent for culture.  EBL during surgery 3025 ml with 1870 ml in blood products/ cell saver.  Patient did have some trouble transitioning off CPB with high PA pressures with struggling systemic pressures and RV, therefore ended up on NO and vasopressor support with milrinone.  He remains sedated and intubated with tenuous hemodynamics today.  PCCM consulted to help with vent management.  HF also being consulted to help with pulmonary hypertension.    Of note, patient is followed in our pulmonary office by Dr. SHalford Chessman last seen 08/10/21 by BDerl Barrow NP.  Previous CTA 6/22 neg for PE, noted underlying emphysematous changes with areas  of fibrosis and scarring, nodular opacity in RML (smaller now than previous imaging).  Reported compliance with CPAP and anoro.  PFT 08/09/21- FVC 3.42 (75%), FEV1 2. 60 (77%), ratio 76, DLCOunc 10/29 (38%).  Uses O2 2L at rest, and 3-4L with exertions for goal sats > 88-90%.  Pertinent  Medical History  Former Tobacco abuse (quit 2010), centrilobular emphysema, pulmonary hypertension, OSA on CPAP, PAF, CAD w/ LAD stent 2003, HLD, MSSA sepsis c/b lumbar discitis and infected left sternoclavicular joint 08/2019   Significant Hospital Events: Including procedures, antibiotic start and stop dates in addition to other pertinent events   9/8 aortic valve replacement with bioprosthetic valve and biatrial Maze procedure  9/8 ETT >> 9/8 foley >> 9/8 R IJ cordis with PA cath >> 9/8 L radial Aline >> 9/8 L femoral aline (placed in OR given variable L radial tracings)>> 9/8 parasternal pacing wires >> 9/8 mediastinal CT x 2 >>  9/8 aortic tissue cx >> 9/8 aortic tissue AFB >> 9/8 aortic fungal >> 9/9 Bcx 2 >>  9/8 ancef >>9/9 9/8 vanc >> 9/9 cefepime >>  Interim History / Subjective:  9/13: cont to wean peep and pressors/inotropes. Wife at bedside and updated.  Coox 70  9/12:  remains on inotropic and vasopressor support as well as iNO. Coox 73. Advanced HF team diuresing, attempt to wean NE and epi today. On 14 peep as well with adequate sats so will decreased to 12 and follow. Cont wean as able. Once on minimal settings will d/w HF about iNO wean as was placed for PA. Pressures remain elevated but  improved from pre-op  9/11:Started on versed infusion last night for agitation. Does better with infusion rather than IVP due to hypotension. Remains on fentanyl and precedex for sedation. On milrinone, low dose epi and NE. High TF residuals overnight. Tmax 100. CVP 11 PA 38/29 ( 33)  Objective   Blood pressure 128/85, pulse (!) 106, temperature 98.8 F (37.1 C), temperature source Core, resp.  rate (!) 30, height '6\' 1"'$  (1.854 m), weight (!) 144 kg, SpO2 95 %. PAP: (37-76)/(19-45) 46/23 CVP:  [7 mmHg-15 mmHg] 8 mmHg PCWP:  [23 mmHg-33 mmHg] 23 mmHg CO:  [6.1 L/min-9 L/min] 8.4 L/min CI:  [2.4 L/min/m2-3.5 L/min/m2] 3.3 L/min/m2  Vent Mode: PRVC FiO2 (%):  [40 %] 40 % Set Rate:  [30 bmp] 30 bmp Vt Set:  [630 mL-6310 mL] 630 mL PEEP:  [12 [name]20] 12 [name]20 Plateau Pressure:  [24 [name]20-26 [name]20] 26 [name]20   Intake/Output Summary (Last 24 hours) at 09/11/2021 0823 Last data filed at 09/11/2021 0800 Gross per 24 hour  Intake 3520.67 ml  Output 5050 ml  Net -1529.33 ml   Filed Weights   09/09/21 0100 09/10/21 0456 09/11/21 0500  Weight: (!) 143 kg (!) 146.1 kg (!) 144 kg    Examination: General:  critically ill appearing man lying in bed in NAD HEENT: [place]/AT, eyes anicteric, mmmp Neuro:  RASS -2,  no cough reflex. CV: Dual paced rhythms, intermittently narrow complex. Distant heart sounds. Minimal serosanguinous drainage from mediastinal drains. PULM:  No significant ETT secretions. Ctab GI: obese, soft, NT Extremities: edema in all extremities.   Skin: warm, dry, no rashes.  [place] 70 BUN 41 Cr 1.55 WBC 8.5 Platelets 84->91 Valve cultures: predominantly monocytes, no organisms seen Fungal-negative AFB-negative 9/9 blood cultures> NGTD  CXR personally reviewed from 9/11>cardiomegaly, pulmonary edema  Resolved Hospital Problem list    Assessment & Plan:   Acute on chronic hypoxic respiratory failure (on home 4L O2 at home) OSA, on home CPAP-- not compliant when sleeping during the day at home Emphysema/ COPD Former tobacco abuse  -titrate vent as able , weaning peep -PAD protocol for sedation. Remains on 1 versed.  -VAP prevention protocol - FiO2 40% ,  so now weaning peep will work to wean iNO after -  vent settings not amendable for SBT  - con't nebulized bronchodilators- brovana and yupelri.  On Anoro PTA. 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 Mononuclear cells on path suggests a more chronic process.  -follow cultures until finalized - Con't broad spectrum antibiotics until hemodynamically improved and additional culture data available but with ckd will change to ctx and vanc. (Cx negative but hx of mssa discitis in 2020). AI developing at time of discitis.  -additionally no autoimmune w/u done so for completeness will send rf, lupus and antiphospholipid -consult ID for completeness sake - trend WBC/ fever curve : both improving  Cardiogenic shock due to decompensated RV failure. Baseline PA pressure 67/20, mean PAP 39 from [place] 06/2021. (Calculated PVR 2.18) Pulmonary hypertension causing acute RV failure OSA-- concern that this may be undertreated despite compliance with CPAP -Con't milrinone, norepi, epinephrine. -diuresis per HF -Con't iNO 30ppm.  -Anticipate he may required sildenafil for PH. -Con't PA pressure monitoring.  AI s/p AVR and MAZE procedure Hx PAF, CAD with LAD stent Shock- likely multifactorial - cardiogenic and possibly septic, improving. - post-op management per TCTS - con't pacing DDD - Cont' inotropic support. - Con't invasive hemodynamic monitoring with  [place].  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 AKI resolved. - Con't to maintain adequate BP and CO - Strict I/Os, con't foley catheter. - con't daily monitoring - Renally dose medications and avoid nephrotoxic meds.  Mild acute anemia, likely due to operative blood loss-we will - Transfuse for hemoglobin less than 7 or hemodynamically significant bleeding. -Continue daily monitoring.  Acute thrombocytopenia, likely consumptive related to surgery and bypass, improving - Continue to monitor  DM with hyperglycemia; A1c 6.5 -Continue Levemir 30 twice daily; anticipate this may increase or he may  require tube feeding coverage as tube feed tolerance increases - Sliding scale insulin as needed -Goal BG <180  Morbid obesity, BMI 39.5 kg/m2 -Long-term recommend moderate weight loss  Best Practice (right click and "Reselect all SmartList Selections" daily)   Diet/type: tubefeeds DVT prophylaxis: SCD, defer to TCTS GI prophylaxis: PPI Lines: yes and it is still needed Foley:  Yes, and it is still needed Code Status:  full code Last date of multidisciplinary goals of care discussion [wife updated at bedside 9/ 11]  Labs   CBC: Recent Labs  Lab 09/07/21 1637 09/08/21 0346 09/08/21 0936 09/09/21 0314 09/09/21 0316 09/10/21 0334 09/10/21 0600 09/11/21 0329  WBC 15.0* 14.7*  --  11.8*  --   --  10.7* 8.5  HGB 11.5* 11.1*   < > 10.8* 10.5* 10.5* 10.7* 10.5*  HCT 36.1* 34.8*   < > 33.7* 31.0* 31.0* 35.1* 33.3*  MCV 93.3 93.5  --  94.4  --   --  95.6 94.3  PLT 82* 73*  --  79*  --   --  84* 91*   < > = values in this interval not displayed.    Basic Metabolic Panel: Recent Labs  Lab 09/07/21 0406 09/07/21 0926 09/07/21 1637 09/08/21 0346 09/09/21 0314 09/09/21 0316 09/09/21 1633 09/10/21 0334 09/10/21 0600 09/10/21 1620 09/11/21 0329  NA 139   < > 139   < > 141   < > 140 144 141 140 140  K 3.9   < > 4.1   < > 4.0   < > 3.9 4.1 4.3 4.3 4.2  CL 107  --  109   < > 112*  --  110  --  112* 110 109  CO2 24  --  24   < > 23  --  23  --  '22 22 23  '$ GLUCOSE 162*  --  108*   < > 145*  --  175*  --  145* 123* 114*  BUN 33*  --  35*   < > 30*  --  32*  --  36* 39* 41*  CREATININE 1.98*  --  1.98*   < > 1.41*  --  1.48*  --  1.58* 1.59* 1.55*  CALCIUM 8.6*  --  8.0*   < > 8.3*  --  8.2*  --  8.7* 8.7* 8.8*  MG 3.0*  --  2.9*  --  2.6*  --   --   --  2.3  --  2.1  PHOS  --   --   --   --  2.7  --   --   --  4.9*  --  4.6   < > = values in this interval not displayed.   GFR: Estimated Creatinine Clearance: 65.2 mL/min (A) (by C-G formula based on SCr  of 1.55 mg/dL  (H)). Recent Labs  Lab 09/08/21 0346 09/09/21 0314 09/10/21 0600 09/11/21 0329  WBC 14.7* 11.8* 10.7* 8.5    Liver Function Tests: Recent Labs  Lab 09/04/21 1130 09/08/21 0346  AST 26 61*  ALT 32 14  ALKPHOS 50 44  BILITOT 1.5* 1.0  PROT 6.4* 4.9*  ALBUMIN 3.8 3.0*   No results for input(s): LIPASE, AMYLASE in the last 168 hours. No results for input(s): AMMONIA in the last 168 hours.  ABG    Component Value Date/Time   PHART 7.290 (L) 09/10/2021 0334   PCO2ART 46.7 09/10/2021 0334   PO2ART 85 09/10/2021 0334   HCO3 22.4 09/10/2021 0334   TCO2 24 09/10/2021 0334   ACIDBASEDEF 4.0 (H) 09/10/2021 0334   O2SAT 72.1 09/11/2021 0329      This patient is critically ill with multiple organ system failure which requires frequent high complexity decision making, assessment, support, evaluation, and titration of therapies. This was completed through the application of advanced monitoring technologies and extensive interpretation of multiple databases. During this encounter critical care time was devoted to patient care services described in this note for 43 minutes.  Audria Nine, DO 09/11/21 8:23 AM [facility]

## 2021-09-11 NOTE — Progress Notes (Signed)
Pharmacy Antibiotic Note  [name] [name]. is a 70 y.o. male admitted on 09/04/2021 with AVR. Pt has hx MSSA bacteremia/discitis s/p cefazolin in 2020. 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 Pharmacy has been consulted for vancomycin and ceftriaxone dosing.   Patient was on vancomycin '1250mg'$  IV q24h ;est AUC 508. Vancomycin peaks and troughs were drawn to confirm levels. VP of 33 and VT of 17, which gave an AUC of 607. Vancomycin dose was adjusted to maintain AUC of 400-550.   Plan: Vancomycin 1000 mg q24 h - estimated AUC 486 Ceftriaxone 2g IV every 24 hours  Monitor renal fx, cx results, clinical pic  Height: '6\' 1"'$  (185.4 cm) Weight: (!) 144 kg (317 lb 7.4 oz) IBW/kg (Calculated) : 79.9  Temp (24hrs), Avg:98.8 F (37.1 C), Min:98.1 F (36.7 C), Max:99.1 F (37.3 C)  Recent Labs  Lab 09/07/21 1637 09/08/21 0346 09/09/21 0314 09/09/21 1633 09/10/21 0600 09/10/21 1415 09/10/21 1620 09/11/21 0329 09/11/21 1118  WBC 15.0* 14.7* 11.8*  --  10.7*  --   --  8.5  --   CREATININE 1.98* 1.84* 1.41* 1.48* 1.58*  --  1.59* 1.55*  --   VANCOTROUGH  --   --   --   --   --   --   --   --  17  VANCOPEAK  --   --   --   --   --  33  --   --   --      Estimated Creatinine Clearance: 65.2 mL/min (A) (by C-G formula based on SCr of 1.55 mg/dL (H)).    Allergies  Allergen Reactions   Pseudoephedrine Other (See Comments)    Patient went into afib   Diltiazem Rash and Itching    Also 2019   Novocain [Procaine] Hives    Dentist appointment in 1958; since then has tolerated lidocaine and provocaine with no hives or difficulty.   Quinolones     Patient was warned about not using Cipro and similar antibiotics. Recent studies have raised concern that fluoroquinolone antibiotics could be associated with an increased risk of aortic aneurysm Fluoroquinolones have non-antimicrobial properties that might jeopardise the integrity of the extracellular matrix of the  vascular wall In a  propensity score matched cohort study in [place], there was a 66% increased rate of aortic aneurysm or dissection associated with oral fluoroquinolone use, compared wit   Sulfonamide Derivatives Other (See Comments)    Childhood reaction     Antimicrobials this admission: Cefazolin 9/8 >> 9/9 Vancomycin 9/9 >>  Cefepime 9/9 >> 9/12 Ceftriaxone 9/12 >>   Microbiology results: 9/8 Valve cx - pending 9/9 Bcx - ngtd    Thank you for allowing pharmacy to be a part of this patient's care.  Cathrine Muster, PharmD PGY2 Cardiology Pharmacy Resident Phone: [phone] 09/11/2021  2:33 PM   Please check AMION for all [place] phone numbers After 10:00 PM, call [place] [phone]

## 2021-09-11 NOTE — Progress Notes (Signed)
RT attempted to wean Nitric to 25 ppm per wean order. Patient's PAP mean increased to 41 and his BP increased to 227/93.  Nitric immediately increased back to 30 ppm and Dr. Ruthann Cancer, CCM notified of the wean fail. RN at bedside.

## 2021-09-12 ENCOUNTER — Other Ambulatory Visit: Payer: Self-pay | Admitting: Cardiology

## 2021-09-12 DIAGNOSIS — I351 Nonrheumatic aortic (valve) insufficiency: Secondary | ICD-10-CM | POA: Diagnosis not present

## 2021-09-12 DIAGNOSIS — I272 Pulmonary hypertension, unspecified: Secondary | ICD-10-CM | POA: Diagnosis not present

## 2021-09-12 LAB — BASIC METABOLIC PANEL
Anion gap: 10 (ref 5–15)
Anion gap: 7 (ref 5–15)
BUN: 41 mg/dL — ABNORMAL HIGH (ref 8–23)
BUN: 47 mg/dL — ABNORMAL HIGH (ref 8–23)
CO2: 23 mmol/L (ref 22–32)
CO2: 24 mmol/L (ref 22–32)
Calcium: 8.9 mg/dL (ref 8.9–10.3)
Calcium: 8.7 mg/dL — ABNORMAL LOW (ref 8.9–10.3)
Chloride: 107 mmol/L (ref 98–111)
Chloride: 110 mmol/L (ref 98–111)
Creatinine, Ser: 1.51 mg/dL — ABNORMAL HIGH (ref 0.61–1.24)
Creatinine, Ser: 1.62 mg/dL — ABNORMAL HIGH (ref 0.61–1.24)
GFR, Estimated: 49 mL/min — ABNORMAL LOW (ref >60)
GFR, Estimated: 45 mL/min — ABNORMAL LOW (ref >60)
Glucose, Bld: 121 mg/dL — ABNORMAL HIGH (ref 70–99)
Glucose, Bld: 168 mg/dL — ABNORMAL HIGH (ref 70–99)
Potassium: 3.9 mmol/L (ref 3.5–5.1)
Potassium: 4.3 mmol/L (ref 3.5–5.1)
Sodium: 140 mmol/L (ref 135–145)
Sodium: 141 mmol/L (ref 135–145)

## 2021-09-12 LAB — CBC
HCT: 34.5 % — ABNORMAL LOW (ref 39.0–52.0)
Hemoglobin: 11 g/dL — ABNORMAL LOW (ref 13.0–17.0)
MCH: 29.8 pg (ref 26.0–34.0)
MCHC: 31.9 g/dL (ref 30.0–36.0)
MCV: 93.5 fL (ref 80.0–100.0)
Platelets: 96 10*3/uL — ABNORMAL LOW (ref 150–400)
RBC: 3.69 MIL/uL — ABNORMAL LOW (ref 4.22–5.81)
RDW: 15.9 % — ABNORMAL HIGH (ref 11.5–15.5)
WBC: 8.7 10*3/uL (ref 4.0–10.5)
nRBC: 0 % (ref 0.0–0.2)

## 2021-09-12 LAB — COOXEMETRY PANEL
Carboxyhemoglobin: 1.1 % (ref 0.5–1.5)
Methemoglobin: 1.3 % (ref 0.0–1.5)
O2 Saturation: 68.1 %
Total hemoglobin: 10.3 g/dL — ABNORMAL LOW (ref 12.0–16.0)

## 2021-09-12 LAB — BARTONELLA ANTIBODY PANEL
B Quintana IgM: NEGATIVE titer
B henselae IgG: NEGATIVE titer
B henselae IgM: NEGATIVE titer
B quintana IgG: NEGATIVE titer

## 2021-09-12 LAB — POCT I-STAT 7, (LYTES, BLD GAS, ICA,H+H)
Acid-base deficit: 1 mmol/L (ref 0.0–2.0)
Bicarbonate: 24.2 mmol/L (ref 20.0–28.0)
Calcium, Ion: 1.31 mmol/L (ref 1.15–1.40)
HCT: 33 % — ABNORMAL LOW (ref 39.0–52.0)
Hemoglobin: 11.2 g/dL — ABNORMAL LOW (ref 13.0–17.0)
O2 Saturation: 96 %
Patient temperature: 36.8
Potassium: 4.1 mmol/L (ref 3.5–5.1)
Sodium: 145 mmol/L (ref 135–145)
TCO2: 25 mmol/L (ref 22–32)
pCO2 arterial: 39 mmHg (ref 32.0–48.0)
pH, Arterial: 7.401 (ref 7.350–7.450)
pO2, Arterial: 83 mmHg (ref 83.0–108.0)

## 2021-09-12 LAB — Q FEVER ANTIBODIES, IGG
Q Fever Phase I: NEGATIVE
Q Fever Phase II: NEGATIVE

## 2021-09-12 LAB — PHOSPHORUS: Phosphorus: 4 mg/dL (ref 2.5–4.6)

## 2021-09-12 LAB — GLUCOSE, CAPILLARY
Glucose-Capillary: 106 mg/dL — ABNORMAL HIGH (ref 70–99)
Glucose-Capillary: 102 mg/dL — ABNORMAL HIGH (ref 70–99)
Glucose-Capillary: 145 mg/dL — ABNORMAL HIGH (ref 70–99)
Glucose-Capillary: 148 mg/dL — ABNORMAL HIGH (ref 70–99)
Glucose-Capillary: 150 mg/dL — ABNORMAL HIGH (ref 70–99)

## 2021-09-12 LAB — MAGNESIUM
Magnesium: 2 mg/dL (ref 1.7–2.4)
Magnesium: 1.8 mg/dL (ref 1.7–2.4)

## 2021-09-12 LAB — CULTURE, BLOOD (ROUTINE X 2)
Culture: NO GROWTH
Culture: NO GROWTH
Special Requests: ADEQUATE
Special Requests: ADEQUATE

## 2021-09-12 LAB — BETA-2-GLYCOPROTEIN I ABS, IGG/M/A
Beta-2 Glyco I IgG: <9 GPI IgG units (ref 0–20)
Beta-2-Glycoprotein I IgA: <9 GPI IgA units (ref 0–25)
Beta-2-Glycoprotein I IgM: <9 GPI IgM units (ref 0–32)

## 2021-09-12 LAB — BRUCELLA ANTIBODY IGG, EIA: Brucella Antibody IgG, EIA: NEGATIVE

## 2021-09-12 LAB — PROTIME-INR
INR: 1.1 (ref 0.8–1.2)
Prothrombin Time: 14.6 seconds (ref 11.4–15.2)

## 2021-09-12 MED ORDER — SILDENAFIL CITRATE 20 MG PO TABS
20.0000 mg | ORAL_TABLET | Freq: Three times a day (TID) | ORAL | Status: DC
  Filled 2021-09-12: qty 1

## 2021-09-12 MED ORDER — WARFARIN SODIUM 5 MG PO TABS
5.0000 mg | ORAL_TABLET | Freq: Once | ORAL | Status: DC
  Filled 2021-09-12: qty 1

## 2021-09-12 MED ORDER — POTASSIUM CHLORIDE 20 MEQ PO PACK
40.0000 meq | PACK | Freq: Once | ORAL | Status: AC
  Administered 2021-09-12: 40 meq
  Filled 2021-09-12: qty 2

## 2021-09-12 MED ORDER — WARFARIN SODIUM 5 MG PO TABS
5.0000 mg | ORAL_TABLET | Freq: Once | ORAL | Status: AC
  Administered 2021-09-12: 5 mg

## 2021-09-12 MED ORDER — WARFARIN - PHARMACIST DOSING INPATIENT: Freq: Every day | Status: DC

## 2021-09-12 MED ORDER — VITAL 1.5 CAL PO LIQD
1000.0000 mL | ORAL | Status: DC
  Administered 2021-09-12: 1000 mL

## 2021-09-12 MED ORDER — SILDENAFIL CITRATE 20 MG PO TABS
20.0000 mg | ORAL_TABLET | Freq: Three times a day (TID) | ORAL | Status: DC
  Administered 2021-09-12 – 2021-09-15 (×10): 20 mg
  Filled 2021-09-12 (×9): qty 1

## 2021-09-12 NOTE — Progress Notes (Signed)
   09/12/21 0200  Vitals  BP (!) 175/92   Bathing patient at this time. Nurse x4 to turn patient.

## 2021-09-12 NOTE — Progress Notes (Signed)
6 Days Post-Op Procedure(s) (LRB): AORTIC VALVE REPLACEMENT (AVR) USING INSPIRIS AORTIC VALVE 27 MM (N/A) MAZE (N/A) TRANSESOPHAGEAL ECHOCARDIOGRAM (TEE) (N/A) APPLICATION OF CELL SAVER CLIPPING OF ATRIAL APPENDAGE USING ATRICLIP PRO 240 Subjective: Intubated and sedated on vent but more awake this am.  Epi and NE weaned off and SBP 130's-170's this am. BP rises when aroused.  Milrinone at 0.375, NO 30 ppm CI 3.6, Co-ox 68.1  -2662 cc yesterday. Wt down 6 lbs. Still 11 lbs over preop.  Objective: Vital signs in last 24 hours: Temp:  [98.1 F (36.7 C)-99.1 F (37.3 C)] 98.4 F (36.9 C) (09/14 0650) Pulse Rate:  [91-119] 91 (09/14 0400) Cardiac Rhythm: Sinus tachycardia (09/14 0400) Resp:  [0-32] 30 (09/14 0650) BP: (83-185)/(51-128) 105/54 (09/14 0600) SpO2:  [91 %-99 %] 95 % (09/14 0650) Arterial Line BP: (87-211)/(22-91) 182/79 (09/14 0650) FiO2 (%):  [40 %] 40 % (09/14 0400) Weight:  [141.2 kg] 141.2 kg (09/14 0427)  Hemodynamic parameters for last 24 hours: PAP: (37-69)/(19-34) 54/27 CVP:  [5 mmHg-22 mmHg] 11 mmHg PCWP:  [24 mmHg-29 mmHg] 24 mmHg CO:  [7.9 L/min-9.2 L/min] 9.2 L/min CI:  [3.1 L/min/m2-3.6 L/min/m2] 3.6 L/min/m2  Intake/Output from previous day: 09/13 0701 - 09/14 0700 In: 3038 [I.V.:2398; NG/GT:535; IV Piggyback:105] Out: 5700 [Urine:5700] Intake/Output this shift: Total I/O In: 1399.1 [I.V.:1099.1; NG/GT:300] Out: 3325 [Urine:3325]  General appearance: more alert this am Neurologic: intact Heart: irregularly irregular rhythm Lungs: clear to auscultation bilaterally Abdomen: soft, non-tender; bowel sounds hypoactive Extremities: edema improving Wound: incision healing well.  Lab Results: Recent Labs    09/11/21 0329 09/12/21 0322 09/12/21 0325  WBC 8.5  --  8.7  HGB 10.5* 11.2* 11.0*  HCT 33.3* 33.0* 34.5*  PLT 91*  --  96*   BMET:  Recent Labs    09/11/21 1823 09/12/21 0322 09/12/21 0325  NA 140 145 140  K 4.1 4.1 3.9  CL  112*  --  107  CO2 23  --  23  GLUCOSE 131*  --  121*  BUN 43*  --  41*  CREATININE 1.57*  --  1.51*  CALCIUM 8.6*  --  8.9    PT/INR: No results for input(s): LABPROT, INR in the last 72 hours. ABG    Component Value Date/Time   PHART 7.401 09/12/2021 0322   HCO3 24.2 09/12/2021 0322   TCO2 25 09/12/2021 0322   ACIDBASEDEF 1.0 09/12/2021 0322   O2SAT 68.1 09/12/2021 0325   CBG (last 3)  Recent Labs    09/11/21 1943 09/11/21 2353 09/12/21 0317  GLUCAP 121* 133* 106*    Assessment/Plan: S/P Procedure(s) (LRB): AORTIC VALVE REPLACEMENT (AVR) USING INSPIRIS AORTIC VALVE 27 MM (N/A) MAZE (N/A) TRANSESOPHAGEAL ECHOCARDIOGRAM (TEE) (N/A) APPLICATION OF CELL SAVER CLIPPING OF ATRIAL APPENDAGE USING ATRICLIP PRO 240  POD 6  Hemodynamics have been more stable. NE and Epi are off with good BP. Consider starting sildenafil for pulmonary HTN to allow NO wean.    VDRF: CCM managing the vent and will decide about when to wean.    Stage 3 CKD with baseline creat about 1.5 with postop acute kidney injury due to hypotension. Creat stable at his baseline of 1.5. Continue diuresis.   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 Operative GS showed WBC but no organisms. Operative valve culture and BC negative.   ID following.   Morbid obesity.   DM: glucose under adequate control on Levemir and SSI.   Nutrition: tube feeds are at 30  cc with no residual. Tube is close to post-pyloric on xray yesterday. Should be able to advance up to goal and follow residuals.    LOS: 6 days    [name] [name] 09/12/2021

## 2021-09-12 NOTE — Progress Notes (Addendum)
RT weaned to 20ppm nitric per CCM order. Pt has stable vitals. BP and PA pressures are appropriate. RT will monitor.

## 2021-09-12 NOTE — Progress Notes (Addendum)
[address]       [place] [address]             [phone]      6 Days Post-Op  Procedure(s) (LRB): AORTIC VALVE REPLACEMENT (AVR) USING INSPIRIS AORTIC VALVE 27 MM (N/A) MAZE (N/A) TRANSESOPHAGEAL ECHOCARDIOGRAM (TEE) (N/A) APPLICATION OF CELL SAVER CLIPPING OF ATRIAL APPENDAGE USING ATRICLIP PRO 240   Total Length of Stay:  LOS: 6 days    SUBJECTIVE:  Vitals:   09/12/21 1545 09/12/21 1600  BP: 132/90   Pulse:    Resp: (!) 27 (!) 30  Temp: 98.8 F (37.1 C) 98.8 F (37.1 C)  SpO2: 99% 98%    Intake/Output      09/13 0701 09/14 0700 09/14 0701 09/15 0700   I.V. (mL/kg) 2410.4 (17.1)    NG/GT 565    IV Piggyback 105    Total Intake(mL/kg) 3080.4 (21.8)    Urine (mL/kg/hr) 5820 (1.7) 575 (0.5)   Chest Tube     Total Output 5820 575   Net -2739.6 -575            sodium chloride 20 mL/hr at 09/12/21 0700   sodium chloride     sodium chloride     amiodarone 30 mg/hr (09/12/21 0701)   epinephrine Stopped (09/12/21 0650)   fentaNYL infusion INTRAVENOUS Stopped (09/12/21 0646)   lactated ringers 20 mL/hr at 09/12/21 0700   lactated ringers 20 mL/hr at 09/10/21 1517   midazolam Stopped (09/11/21 0944)   milrinone 0.375 mcg/kg/min (09/12/21 1300)   norepinephrine (LEVOPHED) Adult infusion 12 mcg/min (09/12/21 1300)    CBC    Component Value Date/Time   WBC 8.7 09/12/2021 0325   RBC 3.69 (L) 09/12/2021 0325   HGB 11.0 (L) 09/12/2021 0325   HGB 13.7 07/12/2021 0823   HGB 16.3 03/12/2011 0948   HCT 34.5 (L) 09/12/2021 0325   HCT 42.5 07/12/2021 0823   HCT 46.6 03/12/2011 0948   PLT 96 (L) 09/12/2021 0325   PLT 179 07/12/2021 0823   MCV 93.5 09/12/2021 0325   MCV 90 07/12/2021 0823   MCV 90.9 03/12/2011 0948   MCH 29.8 09/12/2021 0325   MCHC 31.9 09/12/2021 0325   RDW 15.9 (H) 09/12/2021 0325   RDW 13.1 07/12/2021 0823   RDW 13.1 03/12/2011 0948   LYMPHSABS 1.6 07/13/2021 0801   LYMPHSABS 3.4 (H) 12/25/2017 0937    LYMPHSABS 2.7 03/12/2011 0948   MONOABS 0.6 07/13/2021 0801   MONOABS 0.6 03/12/2011 0948   EOSABS 0.4 07/13/2021 0801   EOSABS 0.6 (H) 12/25/2017 0937   BASOSABS 0.0 07/13/2021 0801   BASOSABS 0.0 12/25/2017 0937   BASOSABS 0.0 03/12/2011 0948   CMP     Component Value Date/Time   NA 140 09/12/2021 0325   NA 138 07/19/2021 1407   K 3.9 09/12/2021 0325   CL 107 09/12/2021 0325   CO2 23 09/12/2021 0325   GLUCOSE 121 (H) 09/12/2021 0325   BUN 41 (H) 09/12/2021 0325   BUN 30 (H) 07/19/2021 1407   CREATININE 1.51 (H) 09/12/2021 0325   CREATININE 1.21 (H) 09/14/2020 0754   CALCIUM 8.9 09/12/2021 0325   PROT 4.9 (L) 09/08/2021 0346   PROT 7.0 04/30/2021 1013   ALBUMIN 3.0 (L) 09/08/2021 0346   ALBUMIN 4.5 04/30/2021 1013   AST 61 (H) 09/08/2021 0346   ALT 14 09/08/2021 0346   ALKPHOS 44 09/08/2021 0346   BILITOT 1.0 09/08/2021 0346   BILITOT  1.0 04/30/2021 1013   GFRNONAA 49 (L) 09/12/2021 0325   GFRNONAA 60 09/14/2020 0754   GFRAA 64 12/04/2020 1444   GFRAA 70 09/14/2020 0754   ABG    Component Value Date/Time   PHART 7.401 09/12/2021 0322   PCO2ART 39.0 09/12/2021 0322   PO2ART 83 09/12/2021 0322   HCO3 24.2 09/12/2021 0322   TCO2 25 09/12/2021 0322   ACIDBASEDEF 1.0 09/12/2021 0322   O2SAT 68.1 09/12/2021 0325   CBG (last 3)  Recent Labs    09/12/21 0850 09/12/21 1129 09/12/21 1544  GLUCAP 102* 145* 148*     ASSESSMENT:  Patient remains intubated... RT is weaning NO as tolerated, Sildenafil started earlier today  Remains on Milrinone at 0.375, Levophed restarted this afternoon, currently at 12  Atrial Fibrillation on Amiodarone, may require cardioversion if doesn't convert, coumadin started  OR cultures negative, ID following, stopped antibiotics   Ellwood Handler, PA-C 09/12/21 4:06 PM   Chart reviewed, patient examined, agree with above.

## 2021-09-12 NOTE — Progress Notes (Signed)
NAME:  Jenesis Hot., MRN:  [id number], DOB:  [birth year], LOS: 6 ADMISSION DATE:  09/05/2021, CONSULTATION DATE:  09/07/21 REFERRING MD:  Dr. [name], CHIEF COMPLAINT:  Resp failure   History of Present Illness:  HPI obtained from medical chart review as patient is intubated and sedated on mechanical ventilation.   70 year old male with prior history of prior tobacco abuse (quit 2010), centrilobular emphysema, chronic hypoxic respiratory failure on home O2 (?4L [place]), pulmonary hypertension, OSA on CPAP, PAF, CAD w/ LAD stent 2003, HLD, MSSA sepsis c/b lumbar discitis and infected left sternoclavicular joint 08/2019 who recently had worsening exertional dyspnea, orthopnea, and fatigue.  Had TTE in 04/2021 which showed new moderate to severe AI with normal EF 55-60 and new dilation of LV cavity with trivial MR and mild TR, normal RV.  Subsequently, underwent TEE on 07/10/21 which shoed EF 55-60%, mild dilation of ascending aorta, and severe AI.  Underwent [facility]  07/16/21 which showed patent LAD stent, mild diffuse nonobstructive CAD, moderate pulmonary hypertension with PA 67/20 (39) and a pulmonary wedge pressure of 66mHg.  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  Found to have AV vegetations which where sent for culture.  EBL during surgery 3025 ml with 1870 ml in blood products/ cell saver.  Patient did have some trouble transitioning off CPB with high PA pressures with struggling systemic pressures and RV, therefore ended up on NO and vasopressor support with milrinone.  He remains sedated and intubated with tenuous hemodynamics today.  PCCM consulted to help with vent management.  HF also being consulted to help with pulmonary hypertension.    Of note, patient is followed in our pulmonary office by Dr. SHalford Chessman last seen 08/10/21 by BDerl Barrow NP.  Previous CTA 6/22 neg for PE, noted underlying emphysematous changes with areas  of fibrosis and scarring, nodular opacity in RML (smaller now than previous imaging).  Reported compliance with CPAP and anoro.  PFT 08/09/21- FVC 3.42 (75%), FEV1 2. 60 (77%), ratio 76, DLCOunc 10/29 (38%).  Uses O2 2L at rest, and 3-4L with exertions for goal sats > 88-90%.  Pertinent  Medical History  Former Tobacco abuse (quit 2010), centrilobular emphysema, pulmonary hypertension, OSA on CPAP, PAF, CAD w/ LAD stent 2003, HLD, MSSA sepsis c/b lumbar discitis and infected left sternoclavicular joint 08/2019   Significant Hospital Events: Including procedures, antibiotic start and stop dates in addition to other pertinent events   9/8 aortic valve replacement with bioprosthetic valve and biatrial Maze procedure  9/8 ETT >> 9/8 foley >> 9/8 R IJ cordis with PA cath >> 9/8 L radial Aline >> 9/8 L femoral aline (placed in OR given variable L radial tracings)>> 9/8 parasternal pacing wires >> 9/8 mediastinal CT x 2 >>  9/8 aortic tissue cx >> 9/8 aortic tissue AFB >> 9/8 aortic fungal >> 9/9 Bcx 2 >>  9/8 ancef >>9/9 9/8 vanc >> 9/9 cefepime >>  Interim History / Subjective:  9/14: off norepi and epi at this time. Remains on milrinone. Noted events overnight with tachycardia and afib, pt was started on amio gtt. He is awake and following commabnds. Remains on iNO 2/2 increase in PAP and decrease in sat when attempted to wean yesterday. Will retry today. Advanced HF team starting revatio.   9/13: cont to wean peep and pressors/inotropes. Wife at bedside and updated.  Coox 72  9/12:  remains on inotropic and vasopressor  support as well as iNO. Coox 73. Advanced HF team diuresing, attempt to wean NE and epi today. On 14 peep as well with adequate sats so will decreased to 12 and follow. Cont wean as able. Once on minimal settings will d/w HF about iNO wean as was placed for PA. Pressures remain elevated but improved from pre-op  9/11:Started on versed infusion last night for agitation. Does  better with infusion rather than IVP due to hypotension. Remains on fentanyl and precedex for sedation. On milrinone, low dose epi and NE. High TF residuals overnight. Tmax 100. CVP 11 PA 38/29 ( 33)  Objective   Blood pressure 121/80, pulse (!) 102, temperature 98.6 F (37 C), resp. rate (!) 30, height '6\' 1"'$  (1.854 m), weight (!) 141.2 kg, SpO2 94 %. PAP: (37-69)/(19-34) 48/24 CVP:  [5 mmHg-22 mmHg] 11 mmHg PCWP:  [24 mmHg-29 mmHg] 24 mmHg CO:  [7.9 L/min-9.2 L/min] 9.2 L/min CI:  [3.1 L/min/m2-3.6 L/min/m2] 3.6 L/min/m2  Vent Mode: PRVC FiO2 (%):  [40 %] 40 % Set Rate:  [30 bmp] 30 bmp Vt Set:  [630 mL] 630 mL PEEP:  [8 [name]20-10 [name]20] 8 [name]20 Plateau Pressure:  [20 [name]20-27 [name]20] 25 [name]20   Intake/Output Summary (Last 24 hours) at 09/12/2021 0823 Last data filed at 09/12/2021 0700 Gross per 24 hour  Intake 2955.9 ml  Output 5670 ml  Net -2714.1 ml   Filed Weights   09/10/21 0456 09/11/21 0500 09/12/21 0427  Weight: (!) 146.1 kg (!) 144 kg (!) 141.2 kg    Examination: General:  reclining comfortably in bed, off sedation ett in place, arousable HEENT: [place]/AT, eyes anicteric, mmmp Neuro:  Rass -1, non focal but grossly and difusely weak CV: Dual paced rhythms, intermittently narrow complex. Distant heart sounds. Minimal serosanguinous drainage from mediastinal drains. PULM:  No significant ETT secretions. Ctab GI: obese, soft, NT Extremities: edema in all extremities.   Skin: warm, dry, no rashes.  Coox 68 (off epi and norepi) on milrinone BUN 41 Cr 1.51  Platelets 84->91->96 Valve cultures: predominantly monocytes, no organisms seen Fungal-negative AFB-negative 9/9 blood cultures> NGTD Rheum w/u: negative to date 9/14 (still awaiting some studies) Brucella/bartonella/qfever: pending  CXR personally reviewed from 9/11>cardiomegaly, pulmonary edema  Resolved Hospital Problem list    Assessment & Plan:   Acute on chronic hypoxic respiratory failure (on home 4L  O2 at home) OSA, on home CPAP-- not compliant when sleeping during the day at home Emphysema/ COPD Former tobacco abuse  -titrate vent as able , peep weaned -PAD protocol for sedation. Off sedation this am.  -VAP prevention protocol - FiO2 40% ,  and peep down -attempted wean of iNO yesterday without success (spike in pap and drop in oxygen) -now off epi and norepi and started on revatio so will reattempt.  -  vent settings not amendable for SBT  - con't nebulized bronchodilators- brovana and yupelri.  On Anoro PTA. 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 Mononuclear cells on path suggests a more chronic process.  -follow cultures until finalized - Con't broad spectrum antibiotics until hemodynamically improved and additional culture data available but with ckd will change to ctx and vanc. (Cx negative but hx of mssa discitis in 2020). AI developing at time of discitis.  -rheum w/u negative to date but some still pending 9/14 -appreciate ID. Agreement that this does not appear to be infective endocarditis.  -stopping empiric abx  Cardiogenic shock due to decompensated RV failure.  Baseline PA pressure 67/20, mean PAP 39 from [place] 06/2021. (Calculated PVR 2.18) Pulmonary hypertension causing acute RV failure OSA-- concern that this may be undertreated despite compliance with CPAP -Con't milrinone per advanced HF team.  -diuresis per HF -wean iNO as tolerated. HF started revatio today.  -Con't PA pressure monitoring.  AI s/p AVR and MAZE procedure Hx PAF, CAD with LAD stent Shock- likely multifactorial - cardiogenic and possibly septic, improving. - post-op management per TCTS - con't pacing DDD - Cont' inotropic support. - Con't invasive hemodynamic monitoring with Swan.  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 AKI resolved. - Con't to maintain adequate BP and CO - Strict I/Os, con't foley catheter. - con't daily monitoring - Renally dose medications and avoid nephrotoxic meds.  Mild acute anemia, likely due to operative blood loss-we will - Transfuse for hemoglobin less than 7 or hemodynamically significant bleeding. -Continue daily monitoring.  Acute thrombocytopenia, likely consumptive related to surgery and bypass, improving - Continue to monitor, improving slowly  DM with hyperglycemia; A1c 6.5 -Continue Levemir 30 twice daily; anticipate this may increase or he may require tube feeding coverage as tube feed tolerance increases - Sliding scale insulin as needed -Goal BG <180  Morbid obesity, BMI 39.5 kg/m2 -Long-term recommend moderate weight loss  Best Practice (right click and "Reselect all SmartList Selections" daily)   Diet/type: tubefeeds DVT prophylaxis: SCD, defer to TCTS GI prophylaxis: PPI Lines: yes and it is still needed Foley:  Yes, and it is still needed Code Status:  full code Last date of multidisciplinary goals of care discussion [wife updated at bedside 9/ 14]  Labs   CBC: Recent Labs  Lab 09/08/21 0346 09/08/21 0936 09/09/21 0314 09/09/21 0316 09/10/21 0334 09/10/21 0600 09/11/21 0329 09/12/21 0322 09/12/21 0325  WBC 14.7*  --  11.8*  --   --  10.7* 8.5  --  8.7  HGB 11.1*   < > 10.8*   < > 10.5* 10.7* 10.5* 11.2* 11.0*  HCT 34.8*   < > 33.7*   < > 31.0* 35.1* 33.3* 33.0* 34.5*  MCV 93.5  --  94.4  --   --  95.6 94.3  --  93.5  PLT 73*  --  79*  --   --  84* 91*  --  96*   < > = values in this interval not displayed.    Basic Metabolic Panel: Recent Labs  Lab 09/07/21 1637 09/08/21 0346 09/09/21 0314 09/09/21 0316 09/10/21 0600 09/10/21 1620 09/11/21 0329 09/11/21 1823 09/12/21 0322 09/12/21 0325  NA 139   < > 141   < > 141 140 140 140 145 140  K 4.1   < > 4.0   < > 4.3 4.3 4.2 4.1 4.1 3.9  CL 109   < > 112*   < > 112*  110 109 112*  --  107  CO2 24   < > 23   < > '22 22 23 23  '$ --  23  GLUCOSE 108*   < > 145*   < > 145* 123* 114* 131*  --  121*  BUN 35*   < > 30*   < > 36* 39* 41* 43*  --  41*  CREATININE 1.98*   < > 1.41*   < > 1.58* 1.59* 1.55* 1.57*  --  1.51*  CALCIUM 8.0*   < > 8.3*   < > 8.7* 8.7* 8.8* 8.6*  --  8.9  MG 2.9*  --  2.6*  --  2.3  --  2.1  --   --  2.0  PHOS  --   --  2.7  --  4.9*  --  4.6  --   --  4.0   < > = values in this interval not displayed.   GFR: Estimated Creatinine Clearance: 66.3 mL/min (A) (by C-G formula based on SCr of 1.51 mg/dL (H)). Recent Labs  Lab 09/09/21 0314 09/10/21 0600 09/11/21 0329 09/12/21 0325  WBC 11.8* 10.7* 8.5 8.7    Liver Function Tests: Recent Labs  Lab 09/08/21 0346  AST 61*  ALT 14  ALKPHOS 44  BILITOT 1.0  PROT 4.9*  ALBUMIN 3.0*   No results for input(s): LIPASE, AMYLASE in the last 168 hours. No results for input(s): AMMONIA in the last 168 hours.  ABG    Component Value Date/Time   PHART 7.401 09/12/2021 0322   PCO2ART 39.0 09/12/2021 0322   PO2ART 83 09/12/2021 0322   HCO3 24.2 09/12/2021 0322   TCO2 25 09/12/2021 0322   ACIDBASEDEF 1.0 09/12/2021 0322   O2SAT 68.1 09/12/2021 0325      This patient is critically ill with multiple organ system failure which requires frequent high complexity decision making, assessment, support, evaluation, and titration of therapies. This was completed through the application of advanced monitoring technologies and extensive interpretation of multiple databases. During this encounter critical care time was devoted to patient care services described in this note for 24mnutes.  JAudria Nine DO 09/12/21 8:23 AM [facility]

## 2021-09-12 NOTE — Progress Notes (Signed)
RT weaned to 10 ppm nitric per CCM. BP and PA pressures/vitals are stable. Rt will monitor.

## 2021-09-12 NOTE — Progress Notes (Signed)
Nutrition Follow-up  DOCUMENTATION CODES:   Not applicable  INTERVENTION:   Tube feeding: -Increase Vital 1.5 to 40 ml/hr -Increase by 10 ml Q12 hours to goal rate of 50 ml/hr (1200 ml) -Prosource TF 90 ml TID  At goal rate TF provides 2040 kcal, 147 gm protein, 912 ml free water daily.  NUTRITION DIAGNOSIS:   Inadequate oral intake related to acute illness as evidenced by NPO status.  Ongoing   GOAL:   Patient will meet greater than or equal to 90% of their needs  Addressed via TF  MONITOR:   Vent status, TF tolerance, Labs, Weight trends  REASON FOR ASSESSMENT:   Consult Enteral/tube feeding initiation and management  ASSESSMENT:   70 yo male admitted with acute on chronic respiratory failure requiring vent support, AV vegetations suggestive of endocarditis, AKI on CKD. PMH includes CKD III, DM, COPD, CHF, HTN  9/08- s/p AVR & TEE  Awake and following commands. Attempting to wean from vent. Weaning pressors. Diuresing with lasix. No BM in 7 days. Currently on scheduled regimen and had suppository yesterday/today. Per wife patient struggles with bowel movements at home, typically spends extended time in the restroom. Per TCTS, okay to advance TF to goal given Cortrak tube tip is close to post-pyloric position.   Admission weight: 143 kg  Current weight: 141.2 kg   UOP: 5820 ml x 24 hrs   Drips: levophed Medications: dulcolax, colace, 40 mg lasix BID, SS novolog, levemir, 10 mg reglan once, miralax, senokot Labs: Cr 1.51-trending down CBG 102-163  Diet Order:   Diet Order             Diet NPO time specified  Diet effective now                   EDUCATION NEEDS:   Not appropriate for education at this time  Skin:  Skin Assessment: Skin Integrity Issues: Skin Integrity Issues:: Other (Comment), Incisions Incisions: chest Other: skin tear R arm  Last BM:  9/7  Height:   Ht Readings from Last 1 Encounters:  09/09/2021 '6\' 1"'$  (1.854 m)     Weight:   Wt Readings from Last 1 Encounters:  09/12/21 (!) 141.2 kg    BMI:  Body mass index is 41.07 kg/m.  Estimated Nutritional Needs:   Kcal:  2000-2200 kcals  Protein:  135-160 g  Fluid:  >/= 2 L  Malynda Smolinski MS, RD, LDN, CNSC Clinical Nutrition Pager listed in [place]

## 2021-09-12 NOTE — Progress Notes (Signed)
Id brief update   Tissue culture no bacterial growth Pathology not suggestive of endocarditis  Pending fungal/afb cx but agree this is likely not endocarditis    -ok to stop empiric antibiotics -id will sign off

## 2021-09-12 NOTE — Progress Notes (Signed)
RT weaned to 15 ppm nitric per CCM order. Pt has stable vitals at this time. BP and PA pressures are appropriate. RT will monitor.

## 2021-09-12 NOTE — Progress Notes (Signed)
Epicardial wires removed per MD order by this RN. HR remains at baseline from prior to procedure. No changes in ectopy since wire removal. SBP continues to be labile but no changes since wire removal. No complications noted. Will continue to monitor.   Luberta Mutter RN

## 2021-09-12 NOTE — Progress Notes (Addendum)
Advanced Heart Failure Rounding Note  PCP-Cardiologist: [name], MD   Subjective:    9/11: Echo EF 40-45% RV ok. RVSP 22.8   Remains intubated FiO2 40%  PEEP 8 .   Remains on iNO   Remains on  milrinone 0.375 mcg, iNO 30 ppm.   [place] #s  CVP 11 PA 55/25  (38)  PVR 1.8 wood  SVR - 926  CO 7  CI 2.6 CO-OX 68%   Awake on vent following commands.  Objective:   Weight Range: (!) 141.2 kg Body mass index is 41.07 kg/m.   Vital Signs:   Temp:  [98.1 F (36.7 C)-99.1 F (37.3 C)] 98.6 F (37 C) (09/14 0700) Pulse Rate:  [91-119] 91 (09/14 0400) Resp:  [0-32] 30 (09/14 0700) BP: (83-185)/(51-128) 121/80 (09/14 0700) SpO2:  [91 %-99 %] 94 % (09/14 0700) Arterial Line BP: (87-211)/(22-91) 140/63 (09/14 0700) FiO2 (%):  [40 %] 40 % (09/14 0400) Weight:  [141.2 kg] 141.2 kg (09/14 0427) Last BM Date: 09/05/21  Weight change: Filed Weights   09/10/21 0456 09/11/21 0500 09/12/21 0427  Weight: (!) 146.1 kg (!) 144 kg (!) 141.2 kg    Intake/Output:   Intake/Output Summary (Last 24 hours) at 09/12/2021 0707 Last data filed at 09/12/2021 0700 Gross per 24 hour  Intake 3080.41 ml  Output 5820 ml  Net -2739.59 ml      Physical Exam   CVP 11 General:  Awake on vent HEENT: ETT Neck: supple. JVP difficult to assess.. Carotids 2+ bilat; no bruits. No lymphadenopathy or thryomegaly appreciated. LIJ  Cor: PMI nondisplaced. Regular rate & rhythm. No rubs, gallops or murmurs. Lungs: Clear Abdomen: soft, nontender, nondistended. No hepatosplenomegaly. No bruits or masses. Good bowel sounds. Extremities: no cyanosis, clubbing, rash, R and LLE 1+ edema Neuro: Intubated awake on vent.  GU: foley   Telemetry   SR 90s  EKG    N/A  Labs    CBC Recent Labs    09/11/21 0329 09/12/21 0322 09/12/21 0325  WBC 8.5  --  8.7  HGB 10.5* 11.2* 11.0*  HCT 33.3* 33.0* 34.5*  MCV 94.3  --  93.5  PLT 91*  --  96*   Basic Metabolic Panel Recent Labs     09/11/21 0329 09/11/21 1823 09/12/21 0322 09/12/21 0325  NA 140 140 145 140  K 4.2 4.1 4.1 3.9  CL 109 112*  --  107  CO2 23 23  --  23  GLUCOSE 114* 131*  --  121*  BUN 41* 43*  --  41*  CREATININE 1.55* 1.57*  --  1.51*  CALCIUM 8.8* 8.6*  --  8.9  MG 2.1  --   --  2.0  PHOS 4.6  --   --  4.0   Liver Function Tests No results for input(s): AST, ALT, ALKPHOS, BILITOT, PROT, ALBUMIN in the last 72 hours.  No results for input(s): LIPASE, AMYLASE in the last 72 hours. Cardiac Enzymes No results for input(s): CKTOTAL, CKMB, CKMBINDEX, TROPONINI in the last 72 hours.  BNP: BNP (last 3 results) No results for input(s): BNP in the last 8760 hours.  ProBNP (last 3 results) No results for input(s): PROBNP in the last 8760 hours.   D-Dimer No results for input(s): DDIMER in the last 72 hours. Hemoglobin A1C No results for input(s): HGBA1C in the last 72 hours. Fasting Lipid Panel No results for input(s): CHOL, HDL, LDLCALC, TRIG, CHOLHDL, LDLDIRECT in the last 72 hours. Thyroid  Function Tests No results for input(s): TSH, T4TOTAL, T3FREE, THYROIDAB in the last 72 hours.  Invalid input(s): FREET3  Other results:   Imaging    No results found.   Medications:     Scheduled Medications:  arformoterol  15 mcg Nebulization BID   aspirin EC  325 mg Oral Daily   Or   aspirin  324 mg Per Tube Daily   atorvastatin  80 mg Per Tube Daily   bisacodyl  10 mg Oral Daily   Or   bisacodyl  10 mg Rectal Daily   brimonidine  1 drop Both Eyes TID   busPIRone  15 mg Per Tube BID   chlorhexidine gluconate (MEDLINE KIT)  15 mL Mouth Rinse BID   Chlorhexidine Gluconate Cloth  6 each Topical Daily   clonazePAM  1 mg Per Tube BID   docusate  200 mg Per Tube Daily   feeding supplement (PROSource TF)  90 mL Per Tube TID   feeding supplement (VITAL 1.5 CAL)  1,000 mL Per Tube Q24H   fentaNYL (SUBLIMAZE) injection  25 mcg Intravenous Once   fluticasone  2 spray Each Nare Daily    furosemide  40 mg Intravenous BID   insulin aspart  0-20 Units Subcutaneous Q4H   insulin detemir  30 Units Subcutaneous BID   mouth rinse  15 mL Mouth Rinse 10 times per day   metoCLOPramide (REGLAN) injection  10 mg Intravenous Once   pantoprazole sodium  40 mg Per Tube Daily   PARoxetine  60 mg Per Tube Daily   polyethylene glycol  17 g Per Tube Daily   potassium chloride  40 mEq Per Tube Once   revefenacin  175 mcg Nebulization Daily   senna  1 tablet Per Tube Daily   sodium chloride flush  3 mL Intravenous Q12H    Infusions:  sodium chloride 20 mL/hr at 09/12/21 0700   sodium chloride     sodium chloride     amiodarone 30 mg/hr (09/12/21 0701)   cefTRIAXone (ROCEPHIN)  IV Stopped (09/11/21 1845)   epinephrine Stopped (09/12/21 0650)   fentaNYL infusion INTRAVENOUS Stopped (09/12/21 0646)   lactated ringers 20 mL/hr at 09/12/21 0700   lactated ringers 20 mL/hr at 09/10/21 1517   midazolam Stopped (09/11/21 0944)   milrinone 0.375 mcg/kg/min (09/12/21 0700)   nitroGLYCERIN     norepinephrine (LEVOPHED) Adult infusion Stopped (09/12/21 0535)   vancomycin 1,000 mg (09/11/21 1654)    PRN Medications: sodium chloride, dextrose, fentaNYL, levalbuterol, midazolam, morphine injection, ondansetron (ZOFRAN) IV, sodium chloride flush    Patient Profile   70 y.o. with history of COPD/smoking, chronic severe aortic insufficiency, paroxysmal atrial fibrillation, CAD, type 2 diabetes, and CKD stage 3.  Patient had PCI to LAD in 12/03, LHC in 7/22 showed only mild CAD.  He has paroxysmal atrial fibrillation and had been maintained on Tikosyn.  He had not been ablated due to obesity.  Patient had lumbar discitis and left sternoclavicular joint infection in 9/20, had MSSA.  He had I&D left [place] joint and IV abx.  Echo at the time showed no vegetation and trivial AI.    HF team consulted for shock/pulmonary hypertension.   Assessment/Plan   1. Aortic insufficiency: TEE pre-op with severe  AI, tricuspid aortic valve.  The LV was moderately dilated.  Now s/p bioprosthetic AVR.  The native valve was perforated with possible vegetation.  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  He had MSSA discitis with prolonged course back  in 9/20.  Uncertain whether valve changes were chronic or more acute/subacute.  Valve tissue cultures NGTD so far.  2. Shock: Post-op.  Suspect mixed picture with RV dysfunction/severe pulmonary hypertension as well as possible distributive component.  Remains afebrile.  Titrating down pressors. SVR remains low, possible distributive shock component.  - Off Norepi + EPi. CO-OX stable. CO 6.7 CI 2.6  -CVP 11. Continue IV lasix. Continue milrinone and iNO. Add sildenafil 20 mg tid.  - Covering for possible sepsis picture with Rocephin/vancomycin.  3.  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  Post-op echo with LV down 40-45% (post-correction of severe AI), normal RV, normal bioprosthetic aortic valve. Post-op volume overload, CVP 11. Continue IV lasix. Continue Lasix  40 mg IV bid and follow response. Needs additional diuresis.  4.  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  Has significant lung parenchymal disease, CTA chest in 6/22 with emphysema as well as areas of fibrosis, as well as OSA and ?OHS.  Pre-op was on CPAP and 4L home oxygen.  Initially, peri-operative PA pressure was very high, in the 90/45 range   -Now off norepi and epi. PA pressures when awake running high. Discussed with Dr [name]. Will add sildenafil 20 mg tid.  - Continue iNO 30 ppm for now, would not wean yet.  - Continue milrinone 0.375, would not wean.  5. Acute on chronic hypoxemic respiratory failure: Intubated post-op.  Baseline COPD + OSA + ?OHS and on home oxygen 4 liters. Now with post-op volume overload as well. -  Vent wean per CCM.  Garlon Hatchet and Yupelri ordered  6. ID: Cultures including tissue culture from valve so far negative.  9/20 MSSA discitis.  ID following. Concern for possible distributive shock component. Blood cultures from 9/9 with NGTD. WBC trending down, afebrile  - He is on vancomcycin/Rocephin, can stop if tissue cultures remain negative.    7. AKI on CKD stage 3: Creatinine stable today at 1.51 (was 1.6)  - Follow with diuresis.  - Daily BMET  8. Atrial fibrillation: Paroxysmal.  On Tikosyn pre-op.  Had Maze procedure.  - Will need eventual anticoagulation, timing per surgery.   Length of Stay: Simpson, NP  09/12/2021, 7:07 AM  Advanced Heart Failure Team Pager [phone] (M-F; 7a - 5p)  Please contact [facility] for night-coverage after hours (5p -7a ) and weekends on [email or web address]  Patient seen with NP, agree with the above note.    He is off norepinephrine and epinephrine, remains on milrinone 0.375 and iNO 30.  CVP 11 with PA 51/27 and CI 2.6.  Co-ox 68%.  Good IV diuresis yesterday, weight coming down. He remains in atrial fibrillation rate 100s on amiodarone gtt 30.   He is awake on vent, FiO2 0.4.   General: Awake on vent Neck: Thick, JVP difficult but appears elevated, no thyromegaly or thyroid nodule.  Lungs: Occasional rhonchi CV: Nondisplaced PMI.  Heart irregular S1/S2, no S3/S4, 2/6 SEM RUSB.  Trace ankle edema.   Abdomen: Soft, nontender, no hepatosplenomegaly, no distention.  Skin: Intact without lesions or rashes.  Neurologic: Awake on vent Extremities: No clubbing or cyanosis.  HEENT: Normal.   Still with some volume overload but weight coming down.  Continue Lasix 40 mg IV bid today.   Off pressors, remains on milrinone 0.375 and iNO 30 with pulmonary  hypertension.  Start sildenafil 20 mg tid, would start weaning iNO later today (decrease to 25 ppm then 20 then 15 if tolerates, would not stop totally until tomorrow when I will try to increase sildenafil  again).   Platelets rising, likely dropped due to stress/inflammation.   Remains in atrial fibrillation.  Will need eventual DCCV if he does not convert to NSR on his own.  He is on amiodarone gtt, start on coumadin today.  Can remove pacing wires per TCTS.   Suspect he did not have acute endocarditis, can stop abx if tissue cultures remain negative.  Rheumatological serologic workup negative.   FiO2 to 0.4, PEEP to 8.  Suspect can extubate soon, per CCM.   CRITICAL CARE Performed by: Loralie Champagne  Total critical care time: 40 minutes  Critical care time was exclusive of separately billable procedures and treating other patients.  Critical care was necessary to treat or prevent imminent or life-threatening deterioration.  Critical care was time spent personally by me on the following activities: development of treatment plan with patient and/or surrogate as well as nursing, discussions with consultants, evaluation of patient's response to treatment, examination of patient, obtaining history from patient or surrogate, ordering and performing treatments and interventions, ordering and review of laboratory studies, ordering and review of radiographic studies, pulse oximetry and re-evaluation of patient's condition.  Loralie Champagne 09/12/2021 7:46 AM

## 2021-09-12 NOTE — Progress Notes (Signed)
Per Aundra Dubin MD, Nitric should remain at a minimum of 10 until 9/15 am if patient is tolerating wean. Plan to increase sildenafil 9/15 prior to weaning Nitrtic off per Aundra Dubin MD.

## 2021-09-12 NOTE — Progress Notes (Signed)
ANTICOAGULATION CONSULT NOTE - Initial Consult  Pharmacy Consult for warfarin Indication:  bioprosthetic aortic valve replacement and a fib  Allergies  Allergen Reactions   Pseudoephedrine Other (See Comments)    Patient went into afib   Diltiazem Rash and Itching    Also 2019   Novocain [Procaine] Hives    Dentist appointment in 1958; since then has tolerated lidocaine and provocaine with no hives or difficulty.   Quinolones     Patient was warned about not using Cipro and similar antibiotics. Recent studies have raised concern that fluoroquinolone antibiotics could be associated with an increased risk of aortic aneurysm Fluoroquinolones have non-antimicrobial properties that might jeopardise the integrity of the extracellular matrix of the vascular wall In a  propensity score matched cohort study in [place], there was a 66% increased rate of aortic aneurysm or dissection associated with oral fluoroquinolone use, compared wit   Sulfonamide Derivatives Other (See Comments)    Childhood reaction     Patient Measurements: Height: 6' 1"  (185.4 cm) Weight: (!) 141.2 kg (311 lb 4.6 oz) IBW/kg (Calculated) : 79.9  Vital Signs: Temp: 98.8 F (37.1 C) (09/14 1330) Temp Source: Core (09/14 0400) BP: 154/70 (09/14 1427) Pulse Rate: 121 (09/14 1427)  Labs: Recent Labs    09/10/21 0600 09/10/21 1620 09/11/21 0329 09/11/21 1823 09/12/21 0322 09/12/21 0325  HGB 10.7*  --  10.5*  --  11.2* 11.0*  HCT 35.1*  --  33.3*  --  33.0* 34.5*  PLT 84*  --  91*  --   --  96*  CREATININE 1.58*   < > 1.55* 1.57*  --  1.51*   < > = values in this interval not displayed.    Estimated Creatinine Clearance: 66.3 mL/min (A) (by C-G formula based on SCr of 1.51 mg/dL (H)).   Medical History: Past Medical History:  Diagnosis Date   Anxiety    Atrial fibrillation ([place]) 03/22/2009   a. s/p multiple DCCV; b. no coumadin due to low TE risk profile; c. Tikosyn Rx   COPD (chronic obstructive  pulmonary disease) ([facility])    Coronary atherosclerosis of native coronary artery 11/2002   a. s/p stent to LAD 12/03; OM2 occluded at cath 12/03; d. myoview 5/10: no ischemia;  e. echo 7/11: EF 55%, BAE, mild RVE, PASP 41-45; Myoview was in March 2013. There was no ischemia or infarction, EF 51%    Cutaneous abscess of back excluding buttocks 07/04/2014   Appears to stem from possibly a cyst very large area 6 cm contact surgeon office    Diabetes mellitus without complication ([place])    Drusen body    see opth note   Dyspnea    Dysrhythmia    Epiglottitis    w emergency nt intubation   ERECTILE DYSFUNCTION 03/22/2009   GERD 03/22/2009   Heart murmur    HYPERGLYCEMIA 04/25/2010   HYPERLIPIDEMIA 03/22/2009   Hypertension    Iliac aneurysm ([place])    2.6 to be evaluated incidental finding on CT   LATERAL EPICONDYLITIS, LEFT 10/24/2009   LIVER FUNCTION TESTS, ABNORMAL 04/25/2010   Local reaction to immunization 05/05/2012   minor resolving  zostavax    Myocardial infarction [facility]) mi2003   Numbness in left leg    foot related to back disease and surgery   Obesity, unspecified 04/24/2009   Perforated appendicitis with necrosis s/p open appendectomy 06/07/14 06/04/2014   Renal cyst    Characterized by MRI as simple   Ruptured suppurative appendicitis  2015    SLEEP APNEA, OBSTRUCTIVE 03/22/2009   compliant with CPAP   THROMBOCYTOPENIA 08/16/2010   TOBACCO USE, QUIT 10/24/2009   ULNAR NEUROPATHY, LEFT 63/89/3734   Umbilical hernia     Medications:  Scheduled:   arformoterol  15 mcg Nebulization BID   aspirin EC  325 mg Oral Daily   Or   aspirin  324 mg Per Tube Daily   atorvastatin  80 mg Per Tube Daily   bisacodyl  10 mg Oral Daily   Or   bisacodyl  10 mg Rectal Daily   brimonidine  1 drop Both Eyes TID   busPIRone  15 mg Per Tube BID   chlorhexidine gluconate (MEDLINE KIT)  15 mL Mouth Rinse BID   Chlorhexidine Gluconate Cloth  6 each Topical Daily   clonazePAM  1 mg Per  Tube BID   docusate  200 mg Per Tube Daily   feeding supplement (PROSource TF)  90 mL Per Tube TID   feeding supplement (VITAL 1.5 CAL)  1,000 mL Per Tube Q24H   fentaNYL (SUBLIMAZE) injection  25 mcg Intravenous Once   fluticasone  2 spray Each Nare Daily   furosemide  40 mg Intravenous BID   insulin aspart  0-20 Units Subcutaneous Q4H   insulin detemir  30 Units Subcutaneous BID   mouth rinse  15 mL Mouth Rinse 10 times per day   metoCLOPramide (REGLAN) injection  10 mg Intravenous Once   pantoprazole sodium  40 mg Per Tube Daily   PARoxetine  60 mg Per Tube Daily   polyethylene glycol  17 g Per Tube Daily   revefenacin  175 mcg Nebulization Daily   senna  1 tablet Per Tube Daily   sildenafil  20 mg Per Tube TID   sodium chloride flush  3 mL Intravenous Q12H    Assessment: 70 yo M s/p bioprosthetic AVR, atrial clip, and MAZE procedure on 9/8. Patient has a PMH of a fib on apixaban PTA and has been in a fib s/p MAZE procedure currently on an amio drip.   Patient is 141 kg and does not have any high risk characteristics. Baseline INR ordered. Hgb 11.0 and plts 96. Will monitor.   The patient is on ASA 325 mg post-op. Will follow-up with CTS regarding switch to ASA 81 mg with warfarin initiation.   Goal of Therapy:  INR 2-3 Monitor platelets by anticoagulation protocol: Yes   Plan:  Warfarin 5 mg PO tonight Daily INR checks Monitor Hgb and platelets Anderson Malta A Lamonte Hartt 09/12/2021,2:45 PM

## 2021-09-12 NOTE — Progress Notes (Signed)
Weaned nitric to 25 ppm. Pt vitals are stable at this time. BP and PA pressures are appropriate with the change per CCM. Rt will monitor.

## 2021-09-12 NOTE — Discharge Summary (Signed)
[address]       [place] [address]             [phone]    Physician Discharge Summary  Patient ID: [name] [name]. MRN: [id number] DOB/AGE: 1951/11/12 70 y.o.  Admit date: 09/10/2021 Discharge date: 09/25/2021  Admission Diagnoses:  Patient Active Problem List   Diagnosis Date Noted   Severe aortic regurgitation 09/14/2021   Dyspnea on exertion 08/03/2021   COPD (chronic obstructive pulmonary disease) ([place]) 08/03/2021   Pulmonary HTN ([place]) 08/03/2021   Respiratory failure with hypoxia ([place]) 08/03/2021   Nonrheumatic aortic valve insufficiency    Thoracic aortic aneurysm without rupture ([place]) 06/21/2021   Biceps tendinitis of both shoulders 02/16/2020   Recurrent cellulitis of lower leg 01/13/2020   Elevated BUN    Postoperative pain    Controlled type 2 diabetes mellitus with hyperglycemia, without long-term current use of insulin (HCC)    Right thalamic infarction ([place])    Hypoalbuminemia due to protein-calorie malnutrition ([place])    Diabetes mellitus type 2 in obese ([place])    Left shoulder pain 08/30/2019   Myelopathy ([place]) 08/27/2019   H/O optic neuritis 08/27/2019   Right hip pain 09/04/2017   Thrombocytopenia ([place]) 06/25/2016   Atrial fibrillation ([place]) 01/01/2016   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   Iliac aneurysm ([place])    Morbid obesity ([place]) 07/08/2014   Type 2 diabetes mellitus with other circulatory complications ([place]) 21/30/8657   Chronic anticoagulation 12/21/2013   Sleep disorder 06/21/2013   Decreased hearing 08/06/2012   Hemorrhoids 08/06/2012   High risk medication use 05/05/2012   Anticoagulation management encounter 05/05/2012   Retinal tear 04/29/2012   Personal history of colonic polyps 05/05/2011   Visit for preventive health examination 05/05/2011   PALPITATIONS 08/16/2010   LIVER FUNCTION TESTS, ABNORMAL 04/25/2010   Lateral epicondylitis 10/24/2009   TOBACCO USE, QUIT 10/24/2009   OBESITY, UNSPECIFIED  04/24/2009   Hyperlipidemia 03/22/2009   ERECTILE DYSFUNCTION 03/22/2009   Obstructive sleep apnea 03/22/2009   ULNAR NEUROPATHY, LEFT 03/22/2009   MYOCARDIAL INFARCTION, HX OF 03/22/2009   Coronary atherosclerosis 03/22/2009   Atrial fibrillation with RVR ([place]) 03/22/2009   GERD 03/22/2009   Discharge Diagnoses:  Patient Active Problem List   Diagnosis Date Noted   Pulmonary edema    S/P AVR 09/13/2021   Severe aortic regurgitation 09/28/2021   Dyspnea on exertion 08/03/2021   COPD (chronic obstructive pulmonary disease) ([place]) 08/03/2021   Pulmonary HTN ([place]) 08/03/2021   Acute hypoxemic respiratory failure ([place]) 08/03/2021   Nonrheumatic aortic valve insufficiency    Thoracic aortic aneurysm without rupture ([place]) 06/21/2021   Biceps tendinitis of both shoulders 02/16/2020   Recurrent cellulitis of lower leg 01/13/2020   Elevated BUN    Postoperative pain    Controlled type 2 diabetes mellitus with hyperglycemia, without long-term current use of insulin (HCC)    Right thalamic infarction ([place])    Hypoalbuminemia due to protein-calorie malnutrition ([place])    Diabetes mellitus type 2 in obese ([place])    Left shoulder pain 08/30/2019   Myelopathy ([place]) 08/27/2019   H/O optic neuritis 08/27/2019   Right hip pain 09/04/2017   Thrombocytopenia ([place]) 06/25/2016   Atrial fibrillation ([place]) 01/01/2016   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   Iliac aneurysm ([place])    Morbid obesity ([place]) 07/08/2014   Type 2 diabetes mellitus with other circulatory complications ([place]) 84/69/6295   Chronic anticoagulation 12/21/2013   Sleep disorder  06/21/2013   Decreased hearing 08/06/2012   Hemorrhoids 08/06/2012   High risk medication use 05/05/2012   Anticoagulation management encounter 05/05/2012   Retinal tear 04/29/2012   Personal history of colonic polyps 05/05/2011   Visit for preventive health examination 05/05/2011   PALPITATIONS 08/16/2010   LIVER FUNCTION TESTS, ABNORMAL 04/25/2010    Lateral epicondylitis 10/24/2009   TOBACCO USE, QUIT 10/24/2009   OBESITY, UNSPECIFIED 04/24/2009   Hyperlipidemia 03/22/2009   ERECTILE DYSFUNCTION 03/22/2009   Obstructive sleep apnea 03/22/2009   ULNAR NEUROPATHY, LEFT 03/22/2009   MYOCARDIAL INFARCTION, HX OF 03/22/2009   Coronary atherosclerosis 03/22/2009   Atrial fibrillation with RVR ([place]) 03/22/2009   GERD 03/22/2009   Discharged Condition: deceased  HPI:   The patient is a 70 year old gentleman with a history of paroxysmal atrial fibrillation status post multiple cardioversions in the past who has been maintained on Tikosyn, coronary disease status post stenting of his LAD in 2003, hyperlipidemia, OSA on CPAP, who had MSSA sepsis with development of lumbar discitis and an infected left sternoclavicular joint in 08/2019.  He underwent incision and drainage and debridement of left sternoclavicular joint with application of a wound VAC by Dr. Servando Snare on 08/31/2019.  He was treated with a long course of intravenous antibiotics followed by oral Keflex.  This resolved without difficulty.  A CT scan of the chest also showed a mildly dilated ascending aorta measuring 4.4 cm.  He had an echocardiogram on 08/29/2019 when he presented with sepsis and this showed a trileaflet aortic valve with mild sclerosis and trivial regurgitation.  There is no evidence of stenosis.  There is no vegetation on the aortic valve.  His left ventricular ejection fraction at that time was 60 to 65% with normal LV cavity size measured at about 5 cm during diastole.  He now presents with recent onset of exertional dyspnea and fatigue.  A 2D echocardiogram on 05/25/2021 showed moderate to severe aortic insufficiency with a pressure half-time of 307 ms.  Mean gradient across aortic valve was 10 mmHg and a peak gradient was 21.2 mmHg.  Left ventricular ejection fraction was 55 to 60% with new dilatation of the LV cavity measured at 6.5 cm during diastole and 5.1 cm during systole.   There was trivial mitral regurgitation and mild tricuspid regurgitation.  Right ventricular function was normal.  He subsequently had a TEE performed on 07/10/2021 which showed an ejection fraction of 55 to 60%.  There is mild dilatation of the ascending aorta at 4.3 cm.  There is severe aortic insufficiency with pressure half-time of 485 ms.  The aortic root diameter is measured at 3.6 cm and the ascending aorta measured at 4.2 cm.  He subsequently underwent cardiac catheterization showing that the previously placed mid LAD stent was widely patent.  There is moderate pulmonary hypertension and 67/20 with a mean of 39.  Mean pulmonary capillary wedge pressure was 17 mmHg.  There is mild diffuse nonobstructive coronary disease. Patient symptoms include shortness of breath, orthopnea, and fatigue. Dr. [name] discussed the need for aortic valve replacement and MAZE.  Hospital Course: Patient underwent an AVR, Maze, LA clip on 09/01/2021. Patient was transferred from the OR to [facility] ICU in critical but stable condition.  The patient had difficulty weaning from bypass.  He required Epinephrine, Milrinone, Levophed, and Nitric oxide post operatively.  He remains sedated on vent.  His pulmonary artery pressures decreased which was felt to be related to volume.  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  He has underlying COPD with severe defect.  This did not improve with replacement of his Aortic valve which eliminated his severe aortic insufficiency.  Critical care consult was obtained to help with ventilatory and pulmonary disease management.  He has underlying CKD and developed an AKI with creatinine level peaking at 1.98.  The patient had vegetations on his valve intraoperatively.  Cultures were taken which showed no bacterial growth.  However he was placed on empiric antibiotics to treat the possibility of active endocarditis infection.  The patient  developed episodes of hypotension overnight resulting in treatment with Albumin and increase of his Levophed.  Advanced heart failure evaluated the patient and recommended repeat Echocardiogram which showed global hypokinesis, systolic function worse, LVEF 40-45%, severe mitral annular calcification, mild dilation of the aortic root, and no pericardial effusion.  They recommended initiating diuretics.  They recommended initiation of Sildenafil once off pressors and Nitric Oxide for his pulmonary hypertension.  He has long standing history of A. Fib and was being A-V paced post operatively.  He will require anticoagulation once he is more stable. He was continued on Milrinone and Nitric Oxide.He remained NPO and tube feedings were continued.  On postop day 5 he remained stable hemodynamically and advanced heart failure continues to help manage weaning the drips.  He continues to require diuresis and CCM is managing the ventilator.  Operative cultures remain negative for organisms but do show WBCs on gram stain.  He was continued on current antibiotic regimen of ceftriaxone and vancomycin.  He received nutrition via tube feedings while on the ventilator.  The patient was started on Sildenafil on 9/14 to help wean Nitric oxide.  Infectious disease followed the patient closely.  Being the cultures obtained in the operating room showed no bacterial growth they did not feel this was endocarditis and stopped his antibiotics on 9/14. In the early morning of post-op day 7 he developed hypoxia. A CXR was obtained and revealed near complete opacification of the left chest. The critical care team conducted urgent bronchoscopy and evacuated thick secretions that were obstructing the left main stem bronchus. The hypoxia improved immediately and the follow up CXR showed improved aeration of the left hemithorax. He remained on the ventilator and nutrition was supported with enteral feeding via Cortrak tube. Continues to require  inotropic support for high output, low SVR physiology.  Midodrine was added to increase SVR.  He continued in atrial fibrillation that was mostly rate controlled.  He was anticoagulated with Coumadin and daily INR was monitored with adjustments in Coumadin dosing as required.  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  He was diuresed aggressively.  Pulmonary medicine/critical care followed closely.  Nutritional support bowel with core track was continued.  On 09/17/2021 a thoracentesis was performed by CCM obtaining 1000 cc of bloody appearing fluid from the left pleural space.  He remains on norepinephrine and milrinone with stable MAP, cardiac index 2.3 and cooximetry of 71%.  CVP is 16 and PA pressures are elevated around 40.  Chest x-ray continues to show pulmonary edema.  His heart rate has slowed but is still felt to be in atrial fibrillation and he continues on Coumadin and amiodarone.  The advanced heart failure team continues to manage these issues.  Vancomycin and Cefepime were restarted on 09/16 for HCAP. Lasix drip was decreased  on 09/20. Per CCM, trial of extubation to BIPAP. KUB done showed no ileus.  He was continued on tube feedings for nutritional support. He was started in Lasix drip to aid with diuresis but developed elevation in his creatinine and his needed to be held.  The patient converted to NSR with 1st degree AV Block.  He was successfully weaned off all inotropic drips.  He remained hyperglycemic and his insulin dose was increased.  He was continued on Midodrine 5 mg tid. Because of PAF, he was started on Coumadin. PT/INR were monitored daily. He was also on oral Amiodarone.  Unfortunately on 9/24 patient developed worsening respiratory state with desaturations into the 60s.  He was emergently intubated without difficulty.  He developed  hypotension and decreased heart rate which progressed into full arrest.  CPR was initiated and 3 rounds were completed.  Resuscitation was not successful and patient ultimately passed away.  Consults: pulmonary/intensive care, ID, and Advance Heart Failure  Significant Diagnostic Studies: cardiac graphics:   Echocardiogram:   IMPRESSIONS     1. Abnormal septal motion consistent with postoperative state. Gobal  hypokinesis. Compared with the echo 04/3645, systolic function is worse.  Left ventricular ejection fraction, by estimation, is 40 to 45%. The left  ventricle has mildly decreased  function. The left ventricle demonstrates regional wall motion  abnormalities (see scoring diagram/findings for description). Left  ventricular diastolic parameters are consistent with Grade II diastolic  dysfunction (pseudonormalization).   2. Right ventricular systolic function is normal. The right ventricular  size is normal. There is normal pulmonary artery systolic pressure.   3. Left atrial size was severely dilated.   4. The mitral valve is degenerative. No evidence of mitral valve  regurgitation. No evidence of mitral stenosis. Severe mitral annular  calcification.   5. The aortic valve has been repaired/replaced. Aortic valve  regurgitation is not visualized. No aortic stenosis is present. There is a  27 mm Edwards Inspiris Resilia valve present in the aortic position.  Aortic valve area, by VTI measures 2.52 cm.  Aortic valve mean gradient measures 11.0 mmHg. Aortic valve Vmax measures  2.23 m/s.   6. Aortic dilatation noted. There is mild dilatation of the aortic root,  measuring 42 mm. There is moderate dilatation of the ascending aorta,  measuring 43 mm.   Treatments: surgery:   Surgeon:  [name], MD   First Assistant: Lars Pinks,  PA-C     Preoperative Diagnosis:  Severe aortic stenosis     Postoperative Diagnosis:  Same     Procedure:   Median  Sternotomy Extracorporeal circulation 3.   Aortic valve replacement using a 27 mm Edwards INSPIRIS RESILIA  pericardial valve. 4.   Bi-atrial MAZE IV procedure 5.   Insertion of left common femoral arterial line.  Allergies as of 2021/10/16       Reactions   Pseudoephedrine Other (See Comments)   Patient went into afib   Diltiazem Rash, Itching   Also 2019   Novocain [procaine] Hives   Dentist appointment in 1958; since then has tolerated lidocaine and provocaine with no hives or difficulty.   Quinolones    Patient was warned about not using Cipro and similar antibiotics. Recent studies have raised concern that fluoroquinolone antibiotics could be associated with an increased risk of aortic aneurysm Fluoroquinolones have non-antimicrobial properties that might jeopardise the integrity of the extracellular matrix of the vascular wall In a  propensity score matched cohort study in [place], there was a  66% increased rate of aortic aneurysm or dissection associated with oral fluoroquinolone use, compared wit   Sulfonamide Derivatives Other (See Comments)   Childhood reaction         Medication List     ASK your doctor about these medications    acetaminophen 325 MG tablet Commonly known as: TYLENOL Take 650 mg by mouth every 6 (six) hours as needed for mild pain.   albuterol 108 (90 Base) MCG/ACT inhaler Commonly known as: VENTOLIN HFA Inhale 2 puffs into the lungs every 6 (six) hours as needed for wheezing or shortness of breath.   ALPRAZolam 0.5 MG tablet Commonly known as: Xanax Take 1 tablet (0.5 mg total) by mouth 2 (two) times daily as needed for anxiety.   Anoro Ellipta 62.5-25 MCG/INH Aepb Generic drug: umeclidinium-vilanterol Inhale 1 puff into the lungs daily.   atorvastatin 80 MG tablet Commonly known as: LIPITOR Take 1 tablet (80 mg total) by mouth daily.   brimonidine 0.15 % ophthalmic solution Commonly known as: ALPHAGAN Place 1 drop into both eyes 3  (three) times daily.   busPIRone 10 MG tablet Commonly known as: BUSPAR Take 1 tablet (10 mg total) by mouth 2 (two) times daily.   busPIRone 15 MG tablet Commonly known as: BUSPAR Take one tablet twice daily.   diphenhydrAMINE 25 MG tablet Commonly known as: BENADRYL Take 25-50 mg by mouth at bedtime as needed for sleep.   dofetilide 500 MCG capsule Commonly known as: TIKOSYN Take 1 capsule (500 mcg total) by mouth 2 (two) times daily.   Eliquis 5 MG Tabs tablet Generic drug: apixaban TAKE 1 TABLET BY MOUTH  TWICE DAILY Ask about: Which instructions should I use?   famotidine 10 MG tablet Commonly known as: PEPCID Take 10 mg by mouth daily as needed for heartburn.   fexofenadine 180 MG tablet Commonly known as: ALLEGRA Take 180 mg by mouth daily as needed for allergies.   fish oil-omega-3 fatty acids 1000 MG capsule Take 1 g by mouth 2 (two) times daily.   fluticasone 50 MCG/ACT nasal spray Commonly known as: FLONASE Place 2 sprays into both nostrils daily.   hydrALAZINE 100 MG tablet Commonly known as: APRESOLINE Take 1 tablet (100 mg total) by mouth 3 (three) times daily.   Klor-Con M20 20 MEQ tablet Generic drug: potassium chloride SA TAKE 1 TABLET BY MOUTH EVERY DAY Ask about: Which instructions should I use?   losartan 100 MG tablet Commonly known as: COZAAR Take 1 tablet (100 mg total) by mouth daily.   Magnesium 250 MG Tabs Take 250-500 mg by mouth See admin instructions. Takes 250 mg by mouth in the morning and 500 mg in the evening   metFORMIN 500 MG 24 hr tablet Commonly known as: GLUCOPHAGE-XR Take 1 tablet (500 mg total) by mouth in the morning, at noon, and at bedtime.   methocarbamol 500 MG tablet Commonly known as: ROBAXIN TAKE 1 TABLET BY MOUTH 4 TIMES A DAY AS NEEDED FOR MUSCLE SPASM   MULTIPLE VITAMIN PO Take 1 tablet by mouth every evening.   nitroGLYCERIN 0.4 MG SL tablet Commonly known as: NITROSTAT Place 1 tablet (0.4 mg total)  under the tongue every 5 (five) minutes as needed for chest pain.   onetouch ultrasoft lancets Twice daily   OneTouch Verio test strip Generic drug: glucose blood USE AS DIRECTED TWICE A DAY   OneTouch Verio w/Device Kit Use to test blood sugars 1-2 times daily.   pantoprazole 40 MG tablet Commonly  known as: PROTONIX Take 40 mg by mouth daily as needed (acid reflux).   PARoxetine 20 MG tablet Commonly known as: PAXIL TAKE 2&1/2 TABLETS BY MOUTH DAILY   Pfizer-BioNT COVID-19 Vac-TriS Susp injection Generic drug: COVID-19 mRNA Vac-TriS (Pfizer) Inject into the muscle.   tamsulosin 0.4 MG Caps capsule Commonly known as: FLOMAX Take 1 capsule (0.4 mg total) by mouth daily as needed (repeat kidney stone).   traZODone 50 MG tablet Commonly known as: DESYREL TAKE 1 TABLET BY MOUTH EVERYDAY AT BEDTIME   triamcinolone cream 0.1 % Commonly known as: KENALOG Apply 1 application topically 2 (two) times daily as needed (irritation).   Vitamin D 50 MCG (2000 UT) tablet Take 2,000-4,000 Units by mouth See admin instructions. Take 2000 units by mouth in the morning and 4000 units in the evening        Signed: Ellwood Handler, PA-C  09/25/2021, 12:42 PM

## 2021-09-12 NOTE — TOC Initial Note (Signed)
Transition of Care (TOC) - Initial/Assessment Note    Patient Details  Name: [name] [name]. MRN: [id number] Date of Birth: 1951-07-22  Transition of Care [facility]) CM/SW Contact:    Erenest Rasher, RN Phone Number:[phone] 09/12/2021, 6:22 PM  Clinical Narrative:                 HF TOC CM spoke to pt's friend, [place] at bedside. Pt lives in home with wife, Orlando Penner. Contacted pt's wife and states she would prefer IP rehab or SNF rehab if recommended. He had IP rehab his last hospital stay and then was dc home with [facility].  Explained PT/OT will follow up once medically stable and give recommendations. Will continue to follow up for dc needs.   Expected Discharge Plan: IP Rehab Facility Barriers to Discharge: Continued Medical Work up   Patient Goals and CMS Choice        Expected Discharge Plan and Services Expected Discharge Plan: [place] In-house Referral: Clinical Social Work Discharge Planning Services: CM Consult   Living arrangements for the past 2 months: [place]  Prior Living Arrangements/Services Living arrangements for the past 2 months: Single Family Home Lives with:: Spouse Patient language and need for interpreter reviewed:: Yes        Need for Family Participation in Patient Care: Yes (Comment) Care giver support system in place?: Yes (comment)   Criminal Activity/Legal Involvement Pertinent to Current Situation/Hospitalization: No - Comment as needed  Activities of Daily Living Home Assistive Devices/Equipment: Blood pressure cuff, CBG Meter, Eyeglasses ADL Screening (condition at time of admission) Patient's cognitive ability adequate to safely complete daily activities?: Yes Is the patient deaf or have difficulty hearing?: Yes Does the patient have difficulty seeing, even when wearing glasses/contacts?: Yes Does the patient have difficulty concentrating, remembering, or making decisions?: No Patient able to express need for  assistance with ADLs?: Yes Does the patient have difficulty dressing or bathing?: No Independently performs ADLs?: Yes (appropriate for developmental age) Does the patient have difficulty walking or climbing stairs?: Yes Weakness of Legs: None Weakness of Arms/Hands: None  Permission Sought/Granted Permission sought to share information with : Case Manager, Family Supports, PCP Permission granted to share information with : Yes, Verbal Permission Granted  Share Information with NAME: Geronimo Esperanza  Permission granted to share info w AGENCY: IP rehab, SNF  Permission granted to share info w Relationship: wife  Permission granted to share info w Contact Information: [phone]  Emotional Assessment Appearance:: Appears stated age Attitude/Demeanor/Rapport: Sedated       Psych Involvement: No (comment)  Admission diagnosis:  Severe aortic regurgitation [I35.1] Patient Active Problem List   Diagnosis Date Noted   S/P AVR 09/20/2021   Severe aortic regurgitation 09/21/2021   Dyspnea on exertion 08/03/2021   COPD (chronic obstructive pulmonary disease) ([place]) 08/03/2021   Pulmonary HTN ([place]) 08/03/2021   Respiratory failure with hypoxia ([place]) 08/03/2021   Nonrheumatic aortic valve insufficiency    Thoracic aortic aneurysm without rupture ([place]) 06/21/2021   Biceps tendinitis of both shoulders 02/16/2020   Recurrent cellulitis of lower leg 01/13/2020   Elevated BUN    Postoperative pain    Controlled type 2 diabetes mellitus with hyperglycemia, without long-term current use of insulin ([place])    Right thalamic infarction ([place])    Hypoalbuminemia due to protein-calorie malnutrition ([place])    Diabetes mellitus type 2 in obese ([place])    Left shoulder pain 08/30/2019   Myelopathy ([place]) 08/27/2019  H/O optic neuritis 08/27/2019   Right hip pain 09/04/2017   Thrombocytopenia ([place]) 06/25/2016   Atrial fibrillation ([place]) 01/01/2016   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   Iliac aneurysm  ([place])    Morbid obesity ([place]) 07/08/2014   Type 2 diabetes mellitus with other circulatory complications ([place]) 0000000   Chronic anticoagulation 12/21/2013   Sleep disorder 06/21/2013   Decreased hearing 08/06/2012   Hemorrhoids 08/06/2012   High risk medication use 05/05/2012   Anticoagulation management encounter 05/05/2012   Retinal tear 04/29/2012   Personal history of colonic polyps 05/05/2011   Visit for preventive health examination 05/05/2011   PALPITATIONS 08/16/2010   LIVER FUNCTION TESTS, ABNORMAL 04/25/2010   Lateral epicondylitis 10/24/2009   TOBACCO USE, QUIT 10/24/2009   OBESITY, UNSPECIFIED 04/24/2009   Hyperlipidemia 03/22/2009   ERECTILE DYSFUNCTION 03/22/2009   Obstructive sleep apnea 03/22/2009   ULNAR NEUROPATHY, LEFT 03/22/2009   MYOCARDIAL INFARCTION, HX OF 03/22/2009   Coronary atherosclerosis 03/22/2009   Atrial fibrillation with RVR ([place]) 03/22/2009   GERD 03/22/2009   PCP:  Burnis Medin, MD Pharmacy:   [id number] [address]Phone: [phone]: [phone] [facility]  [address] [facility] [place]Phone: [phone]: [phone]    Social Determinants of Health (SDOH) Interventions    Readmission Risk Interventions No flowsheet data found.

## 2021-09-13 ENCOUNTER — Inpatient Hospital Stay (HOSPITAL_COMMUNITY): Payer: Medicare Other

## 2021-09-13 DIAGNOSIS — J9811 Atelectasis: Secondary | ICD-10-CM

## 2021-09-13 DIAGNOSIS — I351 Nonrheumatic aortic (valve) insufficiency: Secondary | ICD-10-CM | POA: Diagnosis not present

## 2021-09-13 DIAGNOSIS — I272 Pulmonary hypertension, unspecified: Secondary | ICD-10-CM | POA: Diagnosis not present

## 2021-09-13 LAB — POCT I-STAT 7, (LYTES, BLD GAS, ICA,H+H)
Acid-Base Excess: 0 mmol/L (ref 0.0–2.0)
Bicarbonate: 25.4 mmol/L (ref 20.0–28.0)
Calcium, Ion: 1.3 mmol/L (ref 1.15–1.40)
HCT: 32 % — ABNORMAL LOW (ref 39.0–52.0)
Hemoglobin: 10.9 g/dL — ABNORMAL LOW (ref 13.0–17.0)
O2 Saturation: 99 %
Patient temperature: 37.3
Potassium: 4.1 mmol/L (ref 3.5–5.1)
Sodium: 144 mmol/L (ref 135–145)
TCO2: 27 mmol/L (ref 22–32)
pCO2 arterial: 46.1 mmHg (ref 32.0–48.0)
pH, Arterial: 7.35 (ref 7.350–7.450)
pO2, Arterial: 140 mmHg — ABNORMAL HIGH (ref 83.0–108.0)

## 2021-09-13 LAB — COOXEMETRY PANEL
Carboxyhemoglobin: 0.9 % (ref 0.5–1.5)
Methemoglobin: 1.2 % (ref 0.0–1.5)
O2 Saturation: 77 %
Total hemoglobin: 11.5 g/dL — ABNORMAL LOW (ref 12.0–16.0)

## 2021-09-13 LAB — CBC
HCT: 36.4 % — ABNORMAL LOW (ref 39.0–52.0)
Hemoglobin: 11.4 g/dL — ABNORMAL LOW (ref 13.0–17.0)
MCH: 29.6 pg (ref 26.0–34.0)
MCHC: 31.3 g/dL (ref 30.0–36.0)
MCV: 94.5 fL (ref 80.0–100.0)
Platelets: 111 10*3/uL — ABNORMAL LOW (ref 150–400)
RBC: 3.85 MIL/uL — ABNORMAL LOW (ref 4.22–5.81)
RDW: 16.1 % — ABNORMAL HIGH (ref 11.5–15.5)
WBC: 10.6 10*3/uL — ABNORMAL HIGH (ref 4.0–10.5)
nRBC: 0 % (ref 0.0–0.2)

## 2021-09-13 LAB — ANTIPHOSPHOLIPID SYNDROME EVAL, BLD
Anticardiolipin IgA: <9 APL U/mL (ref 0–11)
Anticardiolipin IgG: <9 GPL U/mL (ref 0–14)
Anticardiolipin IgM: <9 MPL U/mL (ref 0–12)
DRVVT: 37 s (ref 0.0–47.0)
PTT Lupus Anticoagulant: 44.1 s (ref 0.0–51.9)
Phosphatydalserine, IgA: 1 APS Units (ref 0–19)
Phosphatydalserine, IgG: <9 Units (ref 0–30)
Phosphatydalserine, IgM: <10 Units (ref 0–30)

## 2021-09-13 LAB — PROTIME-INR
INR: 1.2 (ref 0.8–1.2)
Prothrombin Time: 15.5 seconds — ABNORMAL HIGH (ref 11.4–15.2)

## 2021-09-13 LAB — BASIC METABOLIC PANEL
Anion gap: 8 (ref 5–15)
BUN: 50 mg/dL — ABNORMAL HIGH (ref 8–23)
CO2: 25 mmol/L (ref 22–32)
Calcium: 8.9 mg/dL (ref 8.9–10.3)
Chloride: 110 mmol/L (ref 98–111)
Creatinine, Ser: 1.6 mg/dL — ABNORMAL HIGH (ref 0.61–1.24)
GFR, Estimated: 46 mL/min — ABNORMAL LOW (ref >60)
Glucose, Bld: 140 mg/dL — ABNORMAL HIGH (ref 70–99)
Potassium: 4.3 mmol/L (ref 3.5–5.1)
Sodium: 143 mmol/L (ref 135–145)

## 2021-09-13 LAB — GLUCOSE, CAPILLARY
Glucose-Capillary: 138 mg/dL — ABNORMAL HIGH (ref 70–99)
Glucose-Capillary: 148 mg/dL — ABNORMAL HIGH (ref 70–99)
Glucose-Capillary: 159 mg/dL — ABNORMAL HIGH (ref 70–99)
Glucose-Capillary: 160 mg/dL — ABNORMAL HIGH (ref 70–99)
Glucose-Capillary: 162 mg/dL — ABNORMAL HIGH (ref 70–99)
Glucose-Capillary: 163 mg/dL — ABNORMAL HIGH (ref 70–99)

## 2021-09-13 LAB — ANCA PROFILE
Anti-MPO Antibodies: <0.2 units (ref 0.0–0.9)
Anti-PR3 Antibodies: <0.2 units (ref 0.0–0.9)
Atypical P-ANCA titer: <1:20 {titer}
C-ANCA: <1:20 {titer}
P-ANCA: <1:20 {titer}

## 2021-09-13 LAB — MAGNESIUM: Magnesium: 1.9 mg/dL (ref 1.7–2.4)

## 2021-09-13 MED ORDER — CHLORHEXIDINE GLUCONATE 0.12 % MT SOLN
OROMUCOSAL | Status: AC
  Filled 2021-09-13: qty 15

## 2021-09-13 MED ORDER — MAGNESIUM SULFATE 2 GM/50ML IV SOLN
2.0000 g | Freq: Once | INTRAVENOUS | Status: AC
  Administered 2021-09-13: 2 g via INTRAVENOUS
  Filled 2021-09-13: qty 50

## 2021-09-13 MED ORDER — VITAL 1.5 CAL PO LIQD
1000.0000 mL | ORAL | Status: DC
  Administered 2021-09-13 – 2021-09-17 (×5): 1000 mL

## 2021-09-13 MED ORDER — WARFARIN SODIUM 5 MG PO TABS
5.0000 mg | ORAL_TABLET | Freq: Once | ORAL | Status: AC
  Administered 2021-09-13: 5 mg via ORAL
  Filled 2021-09-13: qty 1

## 2021-09-13 MED ORDER — PANTOPRAZOLE 2 MG/ML SUSPENSION
40.0000 mg | Freq: Every day | ORAL | Status: DC
  Administered 2021-09-14 – 2021-09-22 (×9): 40 mg
  Filled 2021-09-13 (×5): qty 20

## 2021-09-13 MED ORDER — ACETYLCYSTEINE 20 % IN SOLN
2.0000 mL | Freq: Three times a day (TID) | RESPIRATORY_TRACT | Status: DC
  Administered 2021-09-13 – 2021-09-15 (×6): 2 mL via RESPIRATORY_TRACT
  Filled 2021-09-13: qty 4
  Filled 2021-09-13: qty 2
  Filled 2021-09-13 (×4): qty 4
  Filled 2021-09-13: qty 2
  Filled 2021-09-13 (×2): qty 4
  Filled 2021-09-13: qty 2
  Filled 2021-09-13 (×2): qty 4

## 2021-09-13 MED FILL — Heparin Sodium (Porcine) Inj 1000 Unit/ML: INTRAMUSCULAR | Qty: 30 | Status: AC

## 2021-09-13 MED FILL — Electrolyte-R (PH 7.4) Solution: INTRAVENOUS | Qty: 3000 | Status: AC

## 2021-09-13 MED FILL — Mannitol IV Soln 20%: INTRAVENOUS | Qty: 500 | Status: AC

## 2021-09-13 MED FILL — Sodium Bicarbonate IV Soln 8.4%: INTRAVENOUS | Qty: 50 | Status: AC

## 2021-09-13 MED FILL — Lidocaine HCl Local Preservative Free (PF) Inj 1%: INTRAMUSCULAR | Qty: 5 | Status: AC

## 2021-09-13 MED FILL — Sodium Chloride IV Soln 0.9%: INTRAVENOUS | Qty: 4000 | Status: AC

## 2021-09-13 MED FILL — Lidocaine HCl Local Preservative Free (PF) Inj 2%: INTRAMUSCULAR | Qty: 15 | Status: AC

## 2021-09-13 NOTE — Procedures (Addendum)
Bedside Bronchoscopy Procedure Note [name] [name] [id number] 20-Mar-1951  Procedure: Bronchoscopy Indications: Remove secretions  Procedure Details: ET Tube Size: ET Tube secured at lip (cm): Bite block in place: Yes In preparation for procedure, Patient hyper-oxygenated with 100 % FiO2 Airway entered and the following bronchi were examined: RUL, RML, RLL, LUL, and LLL.   Bronchoscope removed.  , Patient placed back on 100% FiO2 at conclusion of procedure.    Evaluation BP 125/81   Pulse (!) 125   Temp 99.3 F (37.4 C)   Resp (!) 30   Ht '6\' 1"'$  (1.854 m)   Wt (!) 141.2 kg   SpO2 96%   BMI 41.07 kg/m  Breath Sounds:Diminished and Rh[place] O2 sats: stable throughout Patient's Current Condition: stable Specimens:  Sent purulent fluid Complications: No apparent complications Patient did tolerate procedure well.   [name] 09/13/2021, 3:25 AM

## 2021-09-13 NOTE — Progress Notes (Signed)
ANTICOAGULATION CONSULT NOTE - Initial Consult  Pharmacy Consult for warfarin Indication:  bioprosthetic aortic valve replacement and a fib  Allergies  Allergen Reactions   Pseudoephedrine Other (See Comments)    Patient went into afib   Diltiazem Rash and Itching    Also 2019   Novocain [Procaine] Hives    Dentist appointment in 1958; since then has tolerated lidocaine and provocaine with no hives or difficulty.   Quinolones     Patient was warned about not using Cipro and similar antibiotics. Recent studies have raised concern that fluoroquinolone antibiotics could be associated with an increased risk of aortic aneurysm Fluoroquinolones have non-antimicrobial properties that might jeopardise the integrity of the extracellular matrix of the vascular wall In a  propensity score matched cohort study in [place], there was a 66% increased rate of aortic aneurysm or dissection associated with oral fluoroquinolone use, compared wit   Sulfonamide Derivatives Other (See Comments)    Childhood reaction     Patient Measurements: Height: 6' 1" (185.4 cm) Weight: (!) 136.7 kg (301 lb 5.9 oz) IBW/kg (Calculated) : 79.9  Vital Signs: Temp: 99.1 F (37.3 C) (09/15 0747) BP: 140/64 (09/15 0747) Pulse Rate: 112 (09/15 0747)  Labs: Recent Labs    09/11/21 0329 09/11/21 1823 09/12/21 0325 09/12/21 1545 09/12/21 2301 09/13/21 0254 09/13/21 0413  HGB 10.5*   < > 11.0*  --   --  10.9* 11.4*  HCT 33.3*   < > 34.5*  --   --  32.0* 36.4*  PLT 91*  --  96*  --   --   --  111*  LABPROT  --   --   --  14.6  --   --  15.5*  INR  --   --   --  1.1  --   --  1.2  CREATININE 1.55*   < > 1.51*  --  1.62*  --  1.60*   < > = values in this interval not displayed.     Estimated Creatinine Clearance: 61.5 mL/min (A) (by C-G formula based on SCr of 1.6 mg/dL (H)).   Medical History: Past Medical History:  Diagnosis Date   Anxiety    Atrial fibrillation (HCC) 03/22/2009   a. s/p multiple DCCV;  b. no coumadin due to low TE risk profile; c. Tikosyn Rx   COPD (chronic obstructive pulmonary disease) (HCC)    Coronary atherosclerosis of native coronary artery 11/2002   a. s/p stent to LAD 12/03; OM2 occluded at cath 12/03; d. myoview 5/10: no ischemia;  e. echo 7/11: EF 55%, BAE, mild RVE, PASP 41-45; Myoview was in March 2013. There was no ischemia or infarction, EF 51%    Cutaneous abscess of back excluding buttocks 07/04/2014   Appears to stem from possibly a cyst very large area 6 cm contact surgeon office    Diabetes mellitus without complication (HCC)    Drusen body    see opth note   Dyspnea    Dysrhythmia    Epiglottitis    w emergency nt intubation   ERECTILE DYSFUNCTION 03/22/2009   GERD 03/22/2009   Heart murmur    HYPERGLYCEMIA 04/25/2010   HYPERLIPIDEMIA 03/22/2009   Hypertension    Iliac aneurysm (HCC)    2.6 to be evaluated incidental finding on CT   LATERAL EPICONDYLITIS, LEFT 10/24/2009   LIVER FUNCTION TESTS, ABNORMAL 04/25/2010   Local reaction to immunization 05/05/2012   minor resolving  zostavax    Myocardial infarction (HCC) mi2003     Numbness in left leg    foot related to back disease and surgery   Obesity, unspecified 04/24/2009   Perforated appendicitis with necrosis s/p open appendectomy 06/07/14 06/04/2014   Renal cyst    Characterized by MRI as simple   Ruptured suppurative appendicitis    2015    SLEEP APNEA, OBSTRUCTIVE 03/22/2009   compliant with CPAP   THROMBOCYTOPENIA 08/16/2010   TOBACCO USE, QUIT 10/24/2009   ULNAR NEUROPATHY, LEFT 45/62/5638   Umbilical hernia     Medications:  Scheduled:   arformoterol  15 mcg Nebulization BID   aspirin EC  325 mg Oral Daily   Or   aspirin  324 mg Per Tube Daily   atorvastatin  80 mg Per Tube Daily   bisacodyl  10 mg Oral Daily   Or   bisacodyl  10 mg Rectal Daily   brimonidine  1 drop Both Eyes TID   busPIRone  15 mg Per Tube BID   chlorhexidine gluconate (MEDLINE KIT)  15 mL Mouth Rinse  BID   Chlorhexidine Gluconate Cloth  6 each Topical Daily   clonazePAM  1 mg Per Tube BID   docusate  200 mg Per Tube Daily   feeding supplement (PROSource TF)  90 mL Per Tube TID   feeding supplement (VITAL 1.5 CAL)  1,000 mL Per Tube Q24H   fentaNYL (SUBLIMAZE) injection  25 mcg Intravenous Once   fluticasone  2 spray Each Nare Daily   furosemide  40 mg Intravenous BID   insulin aspart  0-20 Units Subcutaneous Q4H   insulin detemir  30 Units Subcutaneous BID   mouth rinse  15 mL Mouth Rinse 10 times per day   pantoprazole sodium  40 mg Per Tube Daily   PARoxetine  60 mg Per Tube Daily   polyethylene glycol  17 g Per Tube Daily   revefenacin  175 mcg Nebulization Daily   senna  1 tablet Per Tube Daily   sildenafil  20 mg Per Tube TID   sodium chloride flush  3 mL Intravenous Q12H   Warfarin - Pharmacist Dosing Inpatient   Does not apply q1600    Assessment: 70 yo M s/p bioprosthetic AVR, atrial clip, and MAZE procedure on 9/8. Patient has a PMH of a fib on apixaban PTA and has been in a fib s/p MAZE procedure currently on an amio drip.   Patient is 141 kg and does not have any high risk characteristics. Baseline INR 1.1. Hgb 11.4 and plts 111. INR increased to 1.2 today after 49m dose last night. Will continue with 5 mg dose tonight as not yet seeing the effect of warfarin.  The patient is on ASA 325 mg post-op. Will continue with ASA 325 mg until INR therapeutic per Dr. BCyndia Bent  Goal of Therapy:  INR 2-3 Monitor platelets by anticoagulation protocol: Yes   Plan:  Warfarin 5 mg PO tonight Daily INR checks Monitor Hgb and platelets JAnderson MaltaA Moroni Nester 09/13/2021,8:34 AM

## 2021-09-13 NOTE — Progress Notes (Signed)
RT NOTES: CPT stopped after 32mnutes d/t patient saying he was in pain. Will notify RN.

## 2021-09-13 NOTE — Progress Notes (Signed)
RT NOTES: Nitric weaned to 5ppm per Dr [name] and Dr Aundra Dubin. Pt tolerating well. Will continue to monitor.

## 2021-09-13 NOTE — Progress Notes (Addendum)
[place] Progress Note Patient Name: Caspen Panciera. DOB: 1951/07/15 MRN: [id number]   Date of Service  09/13/2021  HPI/Events of Note  Patient with hypoxia despite a dramatically increased FIO2, CXR shows left lung whited out, r/o collapse, PCCM ground team asked to evaluate for possible bronchoscopy.  eICU Interventions  See above.        Kerry Kass Jocelynn Gioffre 09/13/2021, 2:25 AM

## 2021-09-13 NOTE — Progress Notes (Signed)
Pt on 3ppm of nitric and tolerating wean. Will cont with 1ppm per hour until off today.   Additionally sats remain in mid to high 90's so have weaned pt to 50% and 8 peep at this time.

## 2021-09-13 NOTE — Procedures (Signed)
Bronchoscopy Procedure Note  [place]o Passage  [id number]  08/27/1951  Date:09/13/21  Time:3:30 AM   Provider Performing:Malyn Aytes V. Lilu Mcglown   Procedure(s):  Flexible bronchoscopy with bronchial alveolar lavage (42395)  Indication(s) LT white out / atelectasis with hypoxia  Consent Risks of the procedure as well as the alternatives and risks of each were explained to the patient and/or caregiver.  Consent for the procedure was obtained and is signed in the bedside chart  Anesthesia Versed 2 , fent 50   Time Out Verified patient identification, verified procedure, site/side was marked, verified correct patient position, special equipment/implants available, medications/allergies/relevant history reviewed, required imaging and test results available.   Sterile Technique Usual hand hygiene, masks, gowns, and gloves were used   Procedure Description Bronchoscope advanced through endotracheal tube and into airway.  Airways were examined down to subsegmental level with findings noted below.   Following diagnostic evaluation, BAL(s) performed in Left airways  with normal saline and return of purulent fluid fluid  Findings: Thick purulent secretions suctioned from LT mainstem bronchus & distal airways   Complications/Tolerance None; patient tolerated the procedure well. Chest X-ray is not needed post procedure.   EBL None   Specimen(s) BAL for culture   Sparkles Mcneely V. Elsworth Soho MD

## 2021-09-13 NOTE — Plan of Care (Signed)
  Problem: Respiratory: Goal: Respiratory status will improve Outcome: Progressing   Problem: Cardiac: Goal: Will achieve and/or maintain hemodynamic stability Outcome: Progressing   Problem: Urinary Elimination: Goal: Ability to achieve and maintain adequate renal perfusion and functioning will improve Outcome: Progressing

## 2021-09-13 NOTE — Progress Notes (Signed)
NAME:  [name] [name]., MRN:  [id number], DOB:  21-Apr-1951, LOS: 7 ADMISSION DATE:  08/31/2021, CONSULTATION DATE:  09/07/21 REFERRING MD:  Dr. [name], CHIEF COMPLAINT:  Resp failure   History of Present Illness:  HPI obtained from medical chart review as patient is intubated and sedated on mechanical ventilation.   70 year old male with prior history of prior tobacco abuse (quit 2010), centrilobular emphysema, chronic hypoxic respiratory failure on home O2 (?4L [place]), pulmonary hypertension, OSA on CPAP, PAF, CAD w/ LAD stent 2003, HLD, MSSA sepsis c/b lumbar discitis and infected left sternoclavicular joint 08/2019 who recently had worsening exertional dyspnea, orthopnea, and fatigue.  Had TTE in 04/2021 which showed new moderate to severe AI with normal EF 55-60 and new dilation of LV cavity with trivial MR and mild TR, normal RV.  Subsequently, underwent TEE on 07/10/21 which shoed EF 55-60%, mild dilation of ascending aorta, and severe AI.  Underwent [facility]  07/16/21 which showed patent LAD stent, mild diffuse nonobstructive CAD, moderate pulmonary hypertension with PA 67/20 (39) and a pulmonary wedge pressure of 7mHg.  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  Found to have AV vegetations which where sent for culture.  EBL during surgery 3025 ml with 1870 ml in blood products/ cell saver.  Patient did have some trouble transitioning off CPB with high PA pressures with struggling systemic pressures and RV, therefore ended up on NO and vasopressor support with milrinone.  He remains sedated and intubated with tenuous hemodynamics today.  PCCM consulted to help with vent management.  HF also being consulted to help with pulmonary hypertension.    Of note, patient is followed in our pulmonary office by Dr. SHalford Chessman last seen 08/10/21 by BDerl Barrow NP.  Previous CTA 6/22 neg for PE, noted underlying emphysematous changes with areas  of fibrosis and scarring, nodular opacity in RML (smaller now than previous imaging).  Reported compliance with CPAP and anoro.  PFT 08/09/21- FVC 3.42 (75%), FEV1 2. 60 (77%), ratio 76, DLCOunc 10/29 (38%).  Uses O2 2L at rest, and 3-4L with exertions for goal sats > 88-90%.  Pertinent  Medical History  Former Tobacco abuse (quit 2010), centrilobular emphysema, pulmonary hypertension, OSA on CPAP, PAF, CAD w/ LAD stent 2003, HLD, MSSA sepsis c/b lumbar discitis and infected left sternoclavicular joint 08/2019   Significant Hospital Events: Including procedures, antibiotic start and stop dates in addition to other pertinent events   9/8 aortic valve replacement with bioprosthetic valve and biatrial Maze procedure  9/8 ETT >> 9/8 foley >> 9/8 R IJ cordis with PA cath >> 9/8 L radial Aline >> 9/8 L femoral aline (placed in OR given variable L radial tracings)>> 9/8 parasternal pacing wires >> 9/8 mediastinal CT x 2 >>  9/8 aortic tissue cx >> 9/8 aortic tissue AFB >> 9/8 aortic fungal >> 9/9 Bcx 2 >>  9/8 ancef >>9/9 9/8 vanc >> 9/9 cefepime >> 9/15: L lung white out req bronch   Interim History / Subjective:  9/15: noted events overnight with escalating oxygen requirement. Bronch performed around 0300 with thick secretions removed from L mainstem. Cont to wean iNO and tolerating. At 5ppm. Norepi back on and remains on amio and milrinone per advanced HF team.   9/14: off norepi and epi at this time. Remains on milrinone. Noted events overnight with tachycardia and afib, pt was started on amio gtt. He is awake and following  commabnds. Remains on iNO 2/2 increase in PAP and decrease in sat when attempted to wean yesterday. Will retry today. Advanced HF team starting revatio.   9/13: cont to wean peep and pressors/inotropes. Wife at bedside and updated.  Coox 72  9/12:  remains on inotropic and vasopressor support as well as iNO. Coox 73. Advanced HF team diuresing, attempt to wean NE and  epi today. On 14 peep as well with adequate sats so will decreased to 12 and follow. Cont wean as able. Once on minimal settings will d/w HF about iNO wean as was placed for PA. Pressures remain elevated but improved from pre-op  9/11:Started on versed infusion last night for agitation. Does better with infusion rather than IVP due to hypotension. Remains on fentanyl and precedex for sedation. On milrinone, low dose epi and NE. High TF residuals overnight. Tmax 100. CVP 11 PA 38/29 ( 33)  Objective   Blood pressure 140/64, pulse (!) 112, temperature 99.1 F (37.3 C), resp. rate (!) 30, height '6\' 1"'$  (1.854 m), weight (!) 136.7 kg, SpO2 98 %. PAP: (39-66)/(19-34) 46/32 CVP:  [6 mmHg-10 mmHg] 10 mmHg  Vent Mode: PRVC FiO2 (%):  [40 %-100 %] 100 % Set Rate:  [30 bmp] 30 bmp Vt Set:  [630 mL] 630 mL PEEP:  [8 [name]20-10 [name]20] 10 [name]20 Plateau Pressure:  [22 [name]20-24 [name]20] 24 [name]20   Intake/Output Summary (Last 24 hours) at 09/13/2021 0820 Last data filed at 09/13/2021 0600 Gross per 24 hour  Intake 2351.76 ml  Output 3325 ml  Net -973.24 ml   Filed Weights   09/11/21 0500 09/12/21 0427 09/13/21 0500  Weight: (!) 144 kg (!) 141.2 kg (!) 136.7 kg    Examination: General:  reclining comfortably in bed, on light sedation ett in place, arousable HEENT: /AT, eyes anicteric, mmmp Neuro:  Rass -1, non focal but grossly and difusely weak CV: Dual paced rhythms, intermittently narrow complex. Distant heart sounds.  PULM:  No significant ETT secretions. Ctab GI: obese, soft, NT Extremities: edema in all extremities.   Skin: warm, dry, no rashes.  Coox 77 (off epi ) on milrinone and norepi BUN 41->50 Cr 1.51->1.6  Platelets 84->91->96->111 Valve cultures: predominantly monocytes, no organisms seen Fungal-negative AFB-negative 9/9 blood cultures> NGTD Rheum w/u: negative to date 9/14 (still awaiting some studies) Brucella/bartonella/qfever: pending  CXR personally reviewed from  9/11>cardiomegaly, pulmonary edema  Resolved Hospital Problem list    Assessment & Plan:   Acute on chronic hypoxic respiratory failure (on home 4L O2 at home) OSA, on home CPAP-- not compliant when sleeping during the day at home Emphysema/ COPD Former tobacco abuse  -titrate vent as able  -PAD protocol for sedation. Off sedation this am.  -VAP prevention protocol - FiO2 100% ,  and peep back up with overnight events.  - weaning iNO -  vent settings not amendable for SBT  - con't nebulized bronchodilators- brovana and yupelri.  On Anoro PTA. - cont xopenex prn   AV vegetations s/p avr -Mononuclear cells on path suggests a more chronic process.  -cx negative -rheum w/u negative to date but some still pending 9/15 -appreciate ID. Agreement that this does not appear to be infective endocarditis.  -stopping empiric abx  Cardiogenic shock due to decompensated RV failure. Baseline PA pressure 67/20, mean PAP 39 from [place] 06/2021. (Calculated PVR 2.18) Pulmonary hypertension causing acute RV failure OSA-- concern that this may be undertreated despite compliance with CPAP -Con't milrinone per advanced HF team.  -  diuresis per HF -wean iNO as tolerated to off today. HF started revatio 9/14 -Con't PA pressure monitoring.  AI s/p AVR and MAZE procedure Hx PAF, CAD with LAD stent Shock- likely multifactorial - cardiogenic and possibly septic, improving. - post-op management per TCTS - con't pacing DDD - Cont' inotropic support. - Con't invasive hemodynamic monitoring with Swan.  - ASA, lipitor daily  Concern for ileus: resolved -con't dulcolax, senna and miralax -tolerating tf at 47 -gave a few doses of reglan  AKI on chronic CKD stage 3B, likely r/t hypoperfusion. AKI resolved. - Con't to maintain adequate BP and CO - Strict I/Os, con't foley catheter. - con't daily monitoring - Renally dose medications and avoid nephrotoxic meds.  Mild acute anemia, likely due to operative  blood loss-we will - Transfuse for hemoglobin less than 7 or hemodynamically significant bleeding. -Continue daily monitoring.  Acute thrombocytopenia, likely consumptive related to surgery and bypass, improving - Continue to monitor, improving slowly  DM with hyperglycemia; A1c 6.5 -Continue Levemir 30 twice daily; anticipate this may increase or he may require tube feeding coverage as tube feed tolerance increases - Sliding scale insulin as needed -Goal BG <180  Morbid obesity, BMI 39.5 kg/m2 -Long-term recommend moderate weight loss  Best Practice (right click and "Reselect all SmartList Selections" daily)   Diet/type: tubefeeds DVT prophylaxis: SCD, defer to TCTS GI prophylaxis: PPI Lines: yes and it is still needed Foley:  Yes, and it is still needed Code Status:  full code Last date of multidisciplinary goals of care discussion [wife updated at bedside 9/ 15]  Labs   CBC: Recent Labs  Lab 09/09/21 0314 09/09/21 0316 09/10/21 0600 09/11/21 0329 09/12/21 0322 09/12/21 0325 09/13/21 0254 09/13/21 0413  WBC 11.8*  --  10.7* 8.5  --  8.7  --  10.6*  HGB 10.8*   < > 10.7* 10.5* 11.2* 11.0* 10.9* 11.4*  HCT 33.7*   < > 35.1* 33.3* 33.0* 34.5* 32.0* 36.4*  MCV 94.4  --  95.6 94.3  --  93.5  --  94.5  PLT 79*  --  84* 91*  --  96*  --  111*   < > = values in this interval not displayed.    Basic Metabolic Panel: Recent Labs  Lab 09/09/21 0314 09/09/21 0316 09/10/21 0600 09/10/21 1620 09/11/21 0329 09/11/21 1823 09/12/21 0322 09/12/21 0325 09/12/21 2301 09/13/21 0254 09/13/21 0413  NA 141   < > 141   < > 140 140 145 140 141 144 143  K 4.0   < > 4.3   < > 4.2 4.1 4.1 3.9 4.3 4.1 4.3  CL 112*   < > 112*   < > 109 112*  --  107 110  --  110  CO2 23   < > 22   < > 23 23  --  23 24  --  25  GLUCOSE 145*   < > 145*   < > 114* 131*  --  121* 168*  --  140*  BUN 30*   < > 36*   < > 41* 43*  --  41* 47*  --  50*  CREATININE 1.41*   < > 1.58*   < > 1.55* 1.57*  --   1.51* 1.62*  --  1.60*  CALCIUM 8.3*   < > 8.7*   < > 8.8* 8.6*  --  8.9 8.7*  --  8.9  MG 2.6*  --  2.3  --  2.1  --   --  2.0 1.8  --  1.9  PHOS 2.7  --  4.9*  --  4.6  --   --  4.0  --   --   --    < > = values in this interval not displayed.   GFR: Estimated Creatinine Clearance: 61.5 mL/min (A) (by C-G formula based on SCr of 1.6 mg/dL (H)). Recent Labs  Lab 09/10/21 0600 09/11/21 0329 09/12/21 0325 09/13/21 0413  WBC 10.7* 8.5 8.7 10.6*    Liver Function Tests: Recent Labs  Lab 09/08/21 0346  AST 61*  ALT 14  ALKPHOS 44  BILITOT 1.0  PROT 4.9*  ALBUMIN 3.0*   No results for input(s): LIPASE, AMYLASE in the last 168 hours. No results for input(s): AMMONIA in the last 168 hours.  ABG    Component Value Date/Time   PHART 7.350 09/13/2021 0254   PCO2ART 46.1 09/13/2021 0254   PO2ART 140 (H) 09/13/2021 0254   HCO3 25.4 09/13/2021 0254   TCO2 27 09/13/2021 0254   ACIDBASEDEF 1.0 09/12/2021 0322   O2SAT 77.0 09/13/2021 0413      This patient is critically ill with multiple organ system failure which requires frequent high complexity decision making, assessment, support, evaluation, and titration of therapies. This was completed through the application of advanced monitoring technologies and extensive interpretation of multiple databases. During this encounter critical care time was devoted to patient care services described in this note for 50mnutes.  JAudria Nine DO 09/13/21 8:20 AM [facility]

## 2021-09-13 NOTE — Progress Notes (Signed)
[facility] Progress Note Patient Name: [name] [name]. DOB: 1951/10/03 MRN: [id number]   Date of Service  09/13/2021  HPI/Events of Note  Patient s/p bronchoscopy for left lung atelectasis.  eICU Interventions  Morning CXR ordered for 8 AM.        [name] [name] 09/13/2021, 5:34 AM

## 2021-09-13 NOTE — Progress Notes (Addendum)
[address]       [place] [address]             [phone]      7 Days Post-Op  Procedure(s) (LRB): AORTIC VALVE REPLACEMENT (AVR) USING INSPIRIS AORTIC VALVE 27 MM (N/A) MAZE (N/A) TRANSESOPHAGEAL ECHOCARDIOGRAM (TEE) (N/A) APPLICATION OF CELL SAVER CLIPPING OF ATRIAL APPENDAGE USING ATRICLIP PRO 240   Total Length of Stay:  LOS: 7 days    SUBJECTIVE:  Vitals:   09/13/21 1430 09/13/21 1500  BP: (!) 95/52 (!) 98/47  Pulse:    Resp: (!) 30 (!) 30  Temp: 99.1 F (37.3 C) 99 F (37.2 C)  SpO2: 95% 96%    Intake/Output      09/14 0701 09/15 0700 09/15 0701 09/16 0700   I.V. (mL/kg) 1511.8 (11.1) 335 (2.5)   NG/GT 840 270.8   IV Piggyback  50.2   Total Intake(mL/kg) 2351.8 (17.2) 656 (4.8)   Urine (mL/kg/hr) 3425 (1) 1000 (0.8)   Emesis/NG output  250   Total Output 3425 1250   Net -1073.2 -594            sodium chloride 10 mL/hr at 09/13/21 0816   sodium chloride     sodium chloride 10 mL/hr at 09/13/21 1200   amiodarone 30 mg/hr (09/13/21 1200)   epinephrine Stopped (09/12/21 0650)   fentaNYL infusion INTRAVENOUS 25 mcg/hr (09/13/21 1427)   lactated ringers 10 mL/hr at 09/13/21 1200   lactated ringers 20 mL/hr at 09/10/21 1517   midazolam Stopped (09/11/21 0944)   milrinone 0.375 mcg/kg/min (09/13/21 1200)   norepinephrine (LEVOPHED) Adult infusion 8 mcg/min (09/13/21 1200)    CBC    Component Value Date/Time   WBC 10.6 (H) 09/13/2021 0413   RBC 3.85 (L) 09/13/2021 0413   HGB 11.4 (L) 09/13/2021 0413   HGB 13.7 07/12/2021 0823   HGB 16.3 03/12/2011 0948   HCT 36.4 (L) 09/13/2021 0413   HCT 42.5 07/12/2021 0823   HCT 46.6 03/12/2011 0948   PLT 111 (L) 09/13/2021 0413   PLT 179 07/12/2021 0823   MCV 94.5 09/13/2021 0413   MCV 90 07/12/2021 0823   MCV 90.9 03/12/2011 0948   MCH 29.6 09/13/2021 0413   MCHC 31.3 09/13/2021 0413   RDW 16.1 (H) 09/13/2021 0413   RDW 13.1 07/12/2021 0823   RDW 13.1 03/12/2011 0948    LYMPHSABS 1.6 07/13/2021 0801   LYMPHSABS 3.4 (H) 12/25/2017 0937   LYMPHSABS 2.7 03/12/2011 0948   MONOABS 0.6 07/13/2021 0801   MONOABS 0.6 03/12/2011 0948   EOSABS 0.4 07/13/2021 0801   EOSABS 0.6 (H) 12/25/2017 0937   BASOSABS 0.0 07/13/2021 0801   BASOSABS 0.0 12/25/2017 0937   BASOSABS 0.0 03/12/2011 0948   CMP     Component Value Date/Time   NA 143 09/13/2021 0413   NA 138 07/19/2021 1407   K 4.3 09/13/2021 0413   CL 110 09/13/2021 0413   CO2 25 09/13/2021 0413   GLUCOSE 140 (H) 09/13/2021 0413   BUN 50 (H) 09/13/2021 0413   BUN 30 (H) 07/19/2021 1407   CREATININE 1.60 (H) 09/13/2021 0413   CREATININE 1.21 (H) 09/14/2020 0754   CALCIUM 8.9 09/13/2021 0413   PROT 4.9 (L) 09/08/2021 0346   PROT 7.0 04/30/2021 1013   ALBUMIN 3.0 (L) 09/08/2021 0346   ALBUMIN 4.5 04/30/2021 1013   AST 61 (H) 09/08/2021 0346   ALT 14 09/08/2021 0346   ALKPHOS 44 09/08/2021 0346  BILITOT 1.0 09/08/2021 0346   BILITOT 1.0 04/30/2021 1013   GFRNONAA 46 (L) 09/13/2021 0413   GFRNONAA 60 09/14/2020 0754   GFRAA 64 12/04/2020 1444   GFRAA 70 09/14/2020 0754   ABG    Component Value Date/Time   PHART 7.350 09/13/2021 0254   PCO2ART 46.1 09/13/2021 0254   PO2ART 140 (H) 09/13/2021 0254   HCO3 25.4 09/13/2021 0254   TCO2 27 09/13/2021 0254   ACIDBASEDEF 1.0 09/12/2021 0322   O2SAT 77.0 09/13/2021 0413   CBG (last 3)  Recent Labs    09/13/21 0415 09/13/21 0805 09/13/21 1117  GLUCAP 138* 162* 159*     ASSESSMENT: Awake and responsive on vent.  Nitric oxide weaned to 4 but did no tolerate weaning the norepi.  RN reports persistent thick respiratory secretions. Will add mucomyst.     [name], PA-C    Weaning iNO.  On 3ppm now On fentanyl gtt for agitation Not tolerating levo wean  Lajuana Matte

## 2021-09-13 NOTE — Progress Notes (Signed)
7 Days Post-Op Procedure(s) (LRB): AORTIC VALVE REPLACEMENT (AVR) USING INSPIRIS AORTIC VALVE 27 MM (N/A) MAZE (N/A) TRANSESOPHAGEAL ECHOCARDIOGRAM (TEE) (N/A) APPLICATION OF CELL SAVER CLIPPING OF ATRIAL APPENDAGE USING ATRICLIP PRO 240 Subjective: Intubated and sedated on vent. Had episode of decreased sats overnight, required increased NE. CXR showed left lung collapse from atelectasis. Bronch by Dr. Elsworth Soho suctioned thick mucous occluding left mainstem bronchus. CXR this am improved.  On milrinone 0.375, NE 15, NO10 CI 3.4 and Co-ox 77.  -1L yesterday, Wt down 9 lbs if accurate. Only 1 lbs over preop.  Objective: Vital signs in last 24 hours: Temp:  [98.4 F (36.9 C)-99.3 F (37.4 C)] 99.1 F (37.3 C) (09/15 0600) Pulse Rate:  [102-125] 125 (09/15 0215) Cardiac Rhythm: Sinus tachycardia (09/14 0800) Resp:  [23-30] 23 (09/15 0600) BP: (77-154)/(47-90) 153/76 (09/15 0600) SpO2:  [86 %-100 %] 99 % (09/15 0600) Arterial Line BP: (66-273)/(0-79) 182/79 (09/15 0600) FiO2 (%):  [40 %-100 %] 100 % (09/15 0215) Weight:  [136.7 kg] 136.7 kg (09/15 0500)  Hemodynamic parameters for last 24 hours: PAP: (39-66)/(19-34) 46/32 CVP:  [6 mmHg-11 mmHg] 10 mmHg  Intake/Output from previous day: 09/14 0701 - 09/15 0700 In: 2351.8 [I.V.:1511.8; NG/GT:840] Out: 3425 [Urine:3425] Intake/Output this shift: No intake/output data recorded.  General appearance: sedated but opening eyes. Neurologic: moves all ext Heart: irregularly irregular rhythm Lungs: clear to auscultation bilaterally Abdomen: soft, non-tender; bowel sounds normal today Extremities: edema mild Wound: incision ok  Lab Results: Recent Labs    09/12/21 0325 09/13/21 0254 09/13/21 0413  WBC 8.7  --  10.6*  HGB 11.0* 10.9* 11.4*  HCT 34.5* 32.0* 36.4*  PLT 96*  --  111*   BMET:  Recent Labs    09/12/21 2301 09/13/21 0254 09/13/21 0413  NA 141 144 143  K 4.3 4.1 4.3  CL 110  --  110  CO2 24  --  25  GLUCOSE  168*  --  140*  BUN 47*  --  50*  CREATININE 1.62*  --  1.60*  CALCIUM 8.7*  --  8.9    PT/INR:  Recent Labs    09/13/21 0413  LABPROT 15.5*  INR 1.2   ABG    Component Value Date/Time   PHART 7.350 09/13/2021 0254   HCO3 25.4 09/13/2021 0254   TCO2 27 09/13/2021 0254   ACIDBASEDEF 1.0 09/12/2021 0322   O2SAT 77.0 09/13/2021 0413   CBG (last 3)  Recent Labs    09/12/21 1544 09/12/21 2111 09/13/21 0415  GLUCAP 148* 150* 138*   CXR: improved left lung aeration this am after bronch.  Assessment/Plan: S/P Procedure(s) (LRB): AORTIC VALVE REPLACEMENT (AVR) USING INSPIRIS AORTIC VALVE 27 MM (N/A) MAZE (N/A) TRANSESOPHAGEAL ECHOCARDIOGRAM (TEE) (N/A) APPLICATION OF CELL SAVER CLIPPING OF ATRIAL APPENDAGE USING ATRICLIP PRO 240  POD 7  Hemodynamics have been more stable but had to go back on NE overnight with left lung collapse and hypoxemia. BP is good this am and NE can be weaned again. CI and Co-ox are good. Continue milrinone. Weaning NO. Sildenafil started yesteray.    VDRF: CCM managing the vent and will decide about when to wean.    Stage 3 CKD with baseline creat about 1.5 with postop acute kidney injury due to hypotension. Creat slightly higher at 1.6 today. Continue diuresis.   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 Operative GS showed WBC but no organisms. Operative valve culture and BC negative.   ID following.   Morbid  obesity.   DM: glucose under adequate control on Levemir and SSI.   Nutrition: tolerating TF well and has good BS. Will advance to 50/hr which is goal.   LOS: 7 days    [name] 09/13/2021

## 2021-09-13 NOTE — Progress Notes (Signed)
RT NOTES: Called to room d/t patient desaturating. Sats 86%. Lavaged and suctioned for moderate tan thick secretions. No improvement in sats. Increased fio2 back to 80%. Sats now sitting at 93%. Nitric at 3ppm. Messaged CCM with order to continue weaning Nitric to off and a CXR will be ordered. RN at bedside and made aware.

## 2021-09-13 NOTE — Progress Notes (Signed)
ETT advanced 2cm post bronchoscopy per MD order. Tube secured at 26 at the inside of lip.

## 2021-09-13 NOTE — Progress Notes (Addendum)
Advanced Heart Failure Rounding Note  PCP-Cardiologist: [name], MD   Subjective:    9/11: Echo EF 40-45% RV ok. RVSP 22.8  9/14 - 9/15 Developed hypoxia, CXR with left lung white-out, underwent bronchoscopy with treatment of mucus plug. Placed on norepi overnight.   Remains intubated FiO2 100%  PEEP 8 .   Remains on iNO 10 ppm.   Remains on  milrinone 0.375 mcg, norepi 10 mcg, amio 30   He is in atrial fibrillation rate around 100 bpm.   Creatinine 1.5 => 1.6  Swan #s  CVP 11-12  PA 41/29   CO 8.7  CI 3.4  CO-OX 77%    Objective:   Weight Range: (!) 136.7 kg Body mass index is 39.76 kg/m.   Vital Signs:   Temp:  [98.4 F (36.9 C)-99.3 F (37.4 C)] 99.1 F (37.3 C) (09/15 0600) Pulse Rate:  [102-125] 125 (09/15 0215) Resp:  [23-30] 23 (09/15 0600) BP: (77-154)/(47-90) 153/76 (09/15 0600) SpO2:  [86 %-100 %] 99 % (09/15 0600) Arterial Line BP: (66-273)/(0-79) 182/79 (09/15 0600) FiO2 (%):  [40 %-100 %] 100 % (09/15 0215) Weight:  [136.7 kg] 136.7 kg (09/15 0500) Last BM Date: 09/05/21  Weight change: Filed Weights   09/11/21 0500 09/12/21 0427 09/13/21 0500  Weight: (!) 144 kg (!) 141.2 kg (!) 136.7 kg    Intake/Output:   Intake/Output Summary (Last 24 hours) at 09/13/2021 0709 Last data filed at 09/13/2021 0600 Gross per 24 hour  Intake 2351.76 ml  Output 3425 ml  Net -1073.24 ml      Physical Exam   CVP 11-12  General:  On vent HEENT: ETT Neck: supple. Difficult to assess JVP. Carotids 2+ bilat; no bruits. No lymphadenopathy or thryomegaly appreciated. RIJ swan  Cor: PMI nondisplaced. Irregular rate & rhythm. No rubs, gallops or murmurs. Lungs: Coarest throughout  Abdomen: soft, nontender, nondistended. No hepatosplenomegaly. No bruits or masses. Good bowel sounds. Extremities: no cyanosis, clubbing, rash, R and LLE 1+ edema Neuro: Awake on vent. MAE x4.  Telemetry  A fib 100-110s  EKG    N/A  Labs    CBC Recent Labs     09/12/21 0325 09/13/21 0254 09/13/21 0413  WBC 8.7  --  10.6*  HGB 11.0* 10.9* 11.4*  HCT 34.5* 32.0* 36.4*  MCV 93.5  --  94.5  PLT 96*  --  355*   Basic Metabolic Panel Recent Labs    09/11/21 0329 09/11/21 1823 09/12/21 0325 09/12/21 2301 09/13/21 0254 09/13/21 0413  NA 140   < > 140 141 144 143  K 4.2   < > 3.9 4.3 4.1 4.3  CL 109   < > 107 110  --  110  CO2 23   < > 23 24  --  25  GLUCOSE 114*   < > 121* 168*  --  140*  BUN 41*   < > 41* 47*  --  50*  CREATININE 1.55*   < > 1.51* 1.62*  --  1.60*  CALCIUM 8.8*   < > 8.9 8.7*  --  8.9  MG 2.1  --  2.0 1.8  --  1.9  PHOS 4.6  --  4.0  --   --   --    < > = values in this interval not displayed.   Liver Function Tests No results for input(s): AST, ALT, ALKPHOS, BILITOT, PROT, ALBUMIN in the last 72 hours.  No results for input(s): LIPASE, AMYLASE in  the last 72 hours. Cardiac Enzymes No results for input(s): CKTOTAL, CKMB, CKMBINDEX, TROPONINI in the last 72 hours.  BNP: BNP (last 3 results) No results for input(s): BNP in the last 8760 hours.  ProBNP (last 3 results) No results for input(s): PROBNP in the last 8760 hours.   D-Dimer No results for input(s): DDIMER in the last 72 hours. Hemoglobin A1C No results for input(s): HGBA1C in the last 72 hours. Fasting Lipid Panel No results for input(s): CHOL, HDL, LDLCALC, TRIG, CHOLHDL, LDLDIRECT in the last 72 hours. Thyroid Function Tests No results for input(s): TSH, T4TOTAL, T3FREE, THYROIDAB in the last 72 hours.  Invalid input(s): FREET3  Other results:   Imaging    DG Chest Port 1 View  Result Date: 09/13/2021 CLINICAL DATA:  Respiratory failure.  Status post bronchoscopy. EXAM: PORTABLE CHEST 1 VIEW COMPARISON:  09/13/2021, earlier the same day FINDINGS: 0539 hours. Endotracheal tube tip is approximately 6.7 cm above the base of the carina. A feeding tube passes into the stomach although the distal tip position is not included on the film. Right  IJ pulmonary artery catheter tip is in the main pulmonary outflow tract. The cardio pericardial silhouette is enlarged. There is pulmonary vascular congestion without overt pulmonary edema. With diffuse interstitial pulmonary edema pattern interval decrease in the diffuse left lung airspace opacity with persistent bibasilar atelectasis/infiltrate. Small bilateral pleural effusion suspected. IMPRESSION: 1. No evidence for pneumothorax or pneumomediastinum status post bronchoscopy. 2. Marked interval improvement in left lung aeration with persistent bibasilar atelectasis/infiltrate. 3. Probable small effusions with diffuse interstitial opacity suggesting edema. Electronically Signed   By: [name] Stanley M.D.   On: 09/13/2021 06:05   DG CHEST PORT 1 VIEW  Result Date: 09/13/2021 CLINICAL DATA:  ETT EXAM: PORTABLE CHEST 1 VIEW COMPARISON:  09/11/2021 FINDINGS: Endotracheal tube terminates 8 cm above the carina. Right IJ Swan-Ganz catheter terminates in the main pulmonary artery. Enteric tube courses below the diaphragm. Near complete opacification of the left hemithorax, new, reflecting a large left pleural effusion. Mild pulmonary vascular congestion/interstitial edema. Suspected small right pleural effusion. No pneumothorax. Cardiomegaly.  Prosthetic valve.  Thoracic aortic atherosclerosis. Median sternotomy. IMPRESSION: Endotracheal tube terminates 8 cm above the carina. Additional support apparatus as above. Large left pleural effusion, new. Mild pulmonary vascular congestion/interstitial edema. Suspected small right pleural effusion. Electronically Signed   By: Julian Hy M.D.   On: 09/13/2021 03:55     Medications:     Scheduled Medications:  arformoterol  15 mcg Nebulization BID   aspirin EC  325 mg Oral Daily   Or   aspirin  324 mg Per Tube Daily   atorvastatin  80 mg Per Tube Daily   bisacodyl  10 mg Oral Daily   Or   bisacodyl  10 mg Rectal Daily   brimonidine  1 drop Both Eyes TID    busPIRone  15 mg Per Tube BID   chlorhexidine gluconate (MEDLINE KIT)  15 mL Mouth Rinse BID   Chlorhexidine Gluconate Cloth  6 each Topical Daily   clonazePAM  1 mg Per Tube BID   docusate  200 mg Per Tube Daily   feeding supplement (PROSource TF)  90 mL Per Tube TID   feeding supplement (VITAL 1.5 CAL)  1,000 mL Per Tube Q24H   fentaNYL (SUBLIMAZE) injection  25 mcg Intravenous Once   fluticasone  2 spray Each Nare Daily   furosemide  40 mg Intravenous BID   insulin aspart  0-20 Units Subcutaneous Q4H  insulin detemir  30 Units Subcutaneous BID   mouth rinse  15 mL Mouth Rinse 10 times per day   metoCLOPramide (REGLAN) injection  10 mg Intravenous Once   pantoprazole sodium  40 mg Per Tube Daily   PARoxetine  60 mg Per Tube Daily   polyethylene glycol  17 g Per Tube Daily   revefenacin  175 mcg Nebulization Daily   senna  1 tablet Per Tube Daily   sildenafil  20 mg Per Tube TID   sodium chloride flush  3 mL Intravenous Q12H   Warfarin - Pharmacist Dosing Inpatient   Does not apply q1600    Infusions:  sodium chloride 10 mL/hr at 09/13/21 0600   sodium chloride     sodium chloride     amiodarone 30 mg/hr (09/13/21 0600)   epinephrine Stopped (09/12/21 0650)   fentaNYL infusion INTRAVENOUS Stopped (09/12/21 0646)   lactated ringers 10 mL/hr at 09/13/21 0600   lactated ringers 20 mL/hr at 09/10/21 1517   midazolam Stopped (09/11/21 0944)   milrinone 0.375 mcg/kg/min (09/13/21 0600)   norepinephrine (LEVOPHED) Adult infusion 15 mcg/min (09/13/21 0600)    PRN Medications: sodium chloride, dextrose, fentaNYL, levalbuterol, midazolam, morphine injection, ondansetron (ZOFRAN) IV, sodium chloride flush    Patient Profile   70 y.o. with history of COPD/smoking, chronic severe aortic insufficiency, paroxysmal atrial fibrillation, CAD, type 2 diabetes, and CKD stage 3.  Patient had PCI to LAD in 12/03, LHC in 7/22 showed only mild CAD.  He has paroxysmal atrial fibrillation and  had been maintained on Tikosyn.  He had not been ablated due to obesity.  Patient had lumbar discitis and left sternoclavicular joint infection in 9/20, had MSSA.  He had I&D left Quail joint and IV abx.  Echo at the time showed no vegetation and trivial AI.    HF team consulted for shock/pulmonary hypertension.   Assessment/Plan   1. Aortic insufficiency: TEE pre-op with severe AI, tricuspid aortic valve.  The LV was moderately dilated.  Now s/p bioprosthetic AVR.  The native valve was perforated with possible vegetation.  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  He had MSSA discitis with prolonged course back in 9/20.  Uncertain whether valve changes were chronic or more acute/subacute.  Valve tissue cultures NGTD so far.  2. Shock: Post-op.  Suspect mixed picture with RV dysfunction/severe pulmonary hypertension as well as possible distributive component.  Remains afebrile.  Titrating down pressors. SVR remains low, possible distributive shock component.  - On norepi 10 + milrinone 0.375 mcg.  Should be able to wean back down on NE today.  CO  8.7 CI 3.4   - CVP 11-12. Continue current dose of IV lasix with rising creatinine.  - PA pressures coming down. Wean off iNO - Can continue  milrinone and  sildenafil 20 mg tid.  - Covering for possible sepsis picture with Rocephin/vancomycin.  3.  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  Post-op echo with LV down 40-45% (post-correction of severe AI), normal RV, normal bioprosthetic aortic valve. Post-op volume overload, CVP 11.-12  Continue IV lasix. Continue Lasix  40 mg IV bid and follow response. Needs additional diuresis.  4.  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  Has significant lung parenchymal disease, CTA chest in 6/22 with emphysema as well as areas of fibrosis, as  well as  OSA and ?OHS.  Pre-op was on CPAP and 4L home oxygen.  Initially, peri-operative PA pressure was very high, in the 90/45 range   PA pressures when awake running high.  - Continue sildenafil 20 mg tid.  - Wean iNO - Continue milrinone 0.375, would not wean.  5. Acute on chronic hypoxemic respiratory failure: Intubated post-op.  Baseline COPD + OSA + ?OHS and on home oxygen 4 liters. Now with post-op volume overload as well. 9/15 mucus plug with left lung white-out, S/P Bronch with removal of plug - sputum cx pending. Repeat CXR post-bronch significantly improved.  - Vent wean per CCM.  Garlon Hatchet and Yupelri ordered  6. ID: Cultures including tissue culture from valve so far negative.  9/20 MSSA discitis.  ID following. Concern for possible distributive shock component. Blood cultures from 9/9 with NGTD. WBC trending down, afebrile  - now off antibiotics.  -7. AKI on CKD stage 3: Creatinine stable today at 1.6 - Follow with diuresis.  - Daily BMET  8. Atrial fibrillation: Paroxysmal.  On Tikosyn pre-op.  Had Maze procedure.  Back in A fib. Continue amio drip.   - Started on coumadin.   Discussed with Dr [name] and Dr Aundra Dubin at bedside.  Length of Stay: 7  Amy Clegg, NP  09/13/2021, 7:09 AM  Advanced Heart Failure Team Pager [phone] (M-F; 7a - 5p)  Please contact [facility] for night-coverage after hours (5p -7a ) and weekends on [email or web address]  Patient seen with NP, agree with the above note.     Episode of mucus plugging with left lung white-out yesterday, now s/p bronchoscopy and plug removal with re-expansion of the lung.  He is back on NE at 10 and remains on milrinone 0.375 and iNO 10.  CVP 11 with PA 41/29 and CI 3.6.  Co-ox 77%.  Good IV diuresis yesterday, weight coming down. Creatinine up 1.5 => 1.6.  He remains in atrial fibrillation rate 100s on amiodarone gtt 30.  BP now elevated.    He is awake on vent, FiO2 1.0 post-mucus plugging event.    General: NAD, awake on  vent Neck: Thick, JVP 8-9 cm, no thyromegaly or thyroid nodule.  Lungs: Clear to auscultation bilaterally with normal respiratory effort. CV: Nondisplaced PMI.  Heart irregular S1/S2, no S3/S4, no murmur.  1+ ankle edema.  Abdomen: Soft, nontender, no hepatosplenomegaly, no distention.  Skin: Intact without lesions or rashes.  Neurologic: Follows commands.  Extremities: No clubbing or cyanosis.  HEENT: Normal.   Still with some volume overload but weight coming down.  Continue Lasix 40 mg IV bid today. Will need to watch creatinine.    Back on NE at 10 after event last night but BP is high currently.  He remains on milrinone 0.375 and iNO 10 with pulmonary hypertension.  He is on sildenafil 20 mg tid.  - Think we can wean back down on NE today, hopefully stop again.  - Decrease iNO to 5, can stop if he remains stable.  - Continue current milrinone and sildenafil.    Platelets rising, likely dropped due to stress/inflammation.   CXR shows left lung re-expanded after mucus plugging event last night.  Should be able to wean down FiO2.    Remains in atrial fibrillation.  Will need eventual DCCV if he does not convert to NSR on his own.  He is on amiodarone gtt, has started warfarin.    Suspect he did not have acute endocarditis, tissue cultures remain negative and antibiotics were  stopped.  Rheumatological serologic workup negative.   CRITICAL CARE Performed by: Loralie Champagne  Total critical care time: 40 minutes  Critical care time was exclusive of separately billable procedures and treating other patients.  Critical care was necessary to treat or prevent imminent or life-threatening deterioration.  Critical care was time spent personally by me on the following activities: development of treatment plan with patient and/or surrogate as well as nursing, discussions with consultants, evaluation of patient's response to treatment, examination of patient, obtaining history from patient or  surrogate, ordering and performing treatments and interventions, ordering and review of laboratory studies, ordering and review of radiographic studies, pulse oximetry and re-evaluation of patient's condition.   Loralie Champagne 09/13/2021 7:52 AM

## 2021-09-14 ENCOUNTER — Other Ambulatory Visit: Payer: Self-pay | Admitting: Cardiology

## 2021-09-14 ENCOUNTER — Inpatient Hospital Stay (HOSPITAL_COMMUNITY): Payer: Medicare Other

## 2021-09-14 DIAGNOSIS — I351 Nonrheumatic aortic (valve) insufficiency: Secondary | ICD-10-CM | POA: Diagnosis not present

## 2021-09-14 DIAGNOSIS — I50810 Right heart failure, unspecified: Secondary | ICD-10-CM

## 2021-09-14 LAB — COOXEMETRY PANEL
Carboxyhemoglobin: 1 % (ref 0.5–1.5)
Methemoglobin: 1.1 % (ref 0.0–1.5)
O2 Saturation: 72.9 %
Total hemoglobin: 10.5 g/dL — ABNORMAL LOW (ref 12.0–16.0)

## 2021-09-14 LAB — CBC WITH DIFFERENTIAL/PLATELET
Abs Immature Granulocytes: 0.06 10*3/uL (ref 0.00–0.07)
Basophils Absolute: 0 10*3/uL (ref 0.0–0.1)
Basophils Relative: 0 %
Eosinophils Absolute: 0.7 10*3/uL — ABNORMAL HIGH (ref 0.0–0.5)
Eosinophils Relative: 8 %
HCT: 33.6 % — ABNORMAL LOW (ref 39.0–52.0)
Hemoglobin: 10.5 g/dL — ABNORMAL LOW (ref 13.0–17.0)
Immature Granulocytes: 1 %
Lymphocytes Relative: 12 %
Lymphs Abs: 1.1 10*3/uL (ref 0.7–4.0)
MCH: 29.8 pg (ref 26.0–34.0)
MCHC: 31.3 g/dL (ref 30.0–36.0)
MCV: 95.5 fL (ref 80.0–100.0)
Monocytes Absolute: 1.1 10*3/uL — ABNORMAL HIGH (ref 0.1–1.0)
Monocytes Relative: 12 %
Neutro Abs: 6.2 10*3/uL (ref 1.7–7.7)
Neutrophils Relative %: 67 %
Platelets: 120 10*3/uL — ABNORMAL LOW (ref 150–400)
RBC: 3.52 MIL/uL — ABNORMAL LOW (ref 4.22–5.81)
RDW: 16.1 % — ABNORMAL HIGH (ref 11.5–15.5)
WBC: 9.1 10*3/uL (ref 4.0–10.5)
nRBC: 0 % (ref 0.0–0.2)

## 2021-09-14 LAB — GLUCOSE, CAPILLARY
Glucose-Capillary: 145 mg/dL — ABNORMAL HIGH (ref 70–99)
Glucose-Capillary: 158 mg/dL — ABNORMAL HIGH (ref 70–99)
Glucose-Capillary: 172 mg/dL — ABNORMAL HIGH (ref 70–99)
Glucose-Capillary: 152 mg/dL — ABNORMAL HIGH (ref 70–99)
Glucose-Capillary: 148 mg/dL — ABNORMAL HIGH (ref 70–99)

## 2021-09-14 LAB — BASIC METABOLIC PANEL
Anion gap: 7 (ref 5–15)
BUN: 54 mg/dL — ABNORMAL HIGH (ref 8–23)
CO2: 26 mmol/L (ref 22–32)
Calcium: 8.9 mg/dL (ref 8.9–10.3)
Chloride: 111 mmol/L (ref 98–111)
Creatinine, Ser: 1.59 mg/dL — ABNORMAL HIGH (ref 0.61–1.24)
GFR, Estimated: 46 mL/min — ABNORMAL LOW (ref >60)
Glucose, Bld: 155 mg/dL — ABNORMAL HIGH (ref 70–99)
Potassium: 3.9 mmol/L (ref 3.5–5.1)
Sodium: 144 mmol/L (ref 135–145)

## 2021-09-14 LAB — POCT I-STAT 7, (LYTES, BLD GAS, ICA,H+H)
Acid-Base Excess: 0 mmol/L (ref 0.0–2.0)
Bicarbonate: 25.8 mmol/L (ref 20.0–28.0)
Calcium, Ion: 1.27 mmol/L (ref 1.15–1.40)
HCT: 31 % — ABNORMAL LOW (ref 39.0–52.0)
Hemoglobin: 10.5 g/dL — ABNORMAL LOW (ref 13.0–17.0)
O2 Saturation: 92 %
Patient temperature: 37.1
Potassium: 4.1 mmol/L (ref 3.5–5.1)
Sodium: 145 mmol/L (ref 135–145)
TCO2: 27 mmol/L (ref 22–32)
pCO2 arterial: 45.5 mmHg (ref 32.0–48.0)
pH, Arterial: 7.362 (ref 7.350–7.450)
pO2, Arterial: 66 mmHg — ABNORMAL LOW (ref 83.0–108.0)

## 2021-09-14 LAB — PROCALCITONIN: Procalcitonin: 0.29 ng/mL

## 2021-09-14 LAB — MAGNESIUM: Magnesium: 2.2 mg/dL (ref 1.7–2.4)

## 2021-09-14 LAB — PROTIME-INR
INR: 1.3 — ABNORMAL HIGH (ref 0.8–1.2)
Prothrombin Time: 16.6 seconds — ABNORMAL HIGH (ref 11.4–15.2)

## 2021-09-14 MED ORDER — POTASSIUM CHLORIDE 20 MEQ PO PACK
40.0000 meq | PACK | Freq: Once | ORAL | Status: AC
  Administered 2021-09-14: 40 meq
  Filled 2021-09-14: qty 2

## 2021-09-14 MED ORDER — VANCOMYCIN HCL IN DEXTROSE 1-5 GM/200ML-% IV SOLN
1000.0000 mg | INTRAVENOUS | Status: AC
  Administered 2021-09-15 – 2021-09-18 (×4): 1000 mg via INTRAVENOUS
  Filled 2021-09-14 (×4): qty 200

## 2021-09-14 MED ORDER — VANCOMYCIN HCL 2000 MG/400ML IV SOLN
2000.0000 mg | Freq: Once | INTRAVENOUS | Status: AC
  Administered 2021-09-14: 2000 mg via INTRAVENOUS
  Filled 2021-09-14: qty 400

## 2021-09-14 MED ORDER — METHYLNALTREXONE BROMIDE 12 MG/0.6ML ~~LOC~~ SOLN
0.1500 mg/kg | SUBCUTANEOUS | Status: DC
Start: 2021-09-14 — End: 2021-09-14
  Filled 2021-09-14 (×3): qty 1.2

## 2021-09-14 MED ORDER — WARFARIN SODIUM 5 MG PO TABS
5.0000 mg | ORAL_TABLET | Freq: Once | ORAL | Status: AC
  Administered 2021-09-14: 5 mg via ORAL
  Filled 2021-09-14: qty 1

## 2021-09-14 MED ORDER — SORBITOL 70 % SOLN
30.0000 mL | Freq: Once | Status: AC
  Administered 2021-09-14: 30 mL
  Filled 2021-09-14: qty 30

## 2021-09-14 MED ORDER — SODIUM CHLORIDE 0.9 % IV SOLN
2.0000 g | Freq: Three times a day (TID) | INTRAVENOUS | Status: DC
  Administered 2021-09-14 – 2021-09-18 (×12): 2 g via INTRAVENOUS
  Filled 2021-09-14 (×13): qty 2

## 2021-09-14 MED ORDER — SENNA 8.6 MG PO TABS
2.0000 | ORAL_TABLET | Freq: Every day | ORAL | Status: DC
  Administered 2021-09-14 – 2021-09-21 (×7): 17.2 mg
  Filled 2021-09-14 (×7): qty 2

## 2021-09-14 MED ORDER — METHYLNALTREXONE BROMIDE 12 MG/0.6ML ~~LOC~~ SOLN
0.1500 mg/kg | Freq: Once | SUBCUTANEOUS | Status: AC
  Administered 2021-09-14: 20.8 mg via SUBCUTANEOUS
  Filled 2021-09-14: qty 1.2

## 2021-09-14 MED ORDER — FUROSEMIDE 10 MG/ML IJ SOLN
80.0000 mg | Freq: Two times a day (BID) | INTRAMUSCULAR | Status: DC
  Administered 2021-09-14 – 2021-09-15 (×3): 80 mg via INTRAVENOUS
  Filled 2021-09-14 (×3): qty 8

## 2021-09-14 MED ORDER — FUROSEMIDE 10 MG/ML IJ SOLN: 60.0000 mg | Freq: Two times a day (BID) | INTRAMUSCULAR | Status: DC

## 2021-09-14 NOTE — Progress Notes (Addendum)
Advanced Heart Failure Rounding Note  PCP-Cardiologist: [name], MD   Subjective:    9/11: Echo EF 40-45% RV ok. RVSP 22.8  9/14 - 9/15 Developed hypoxia, CXR with left lung white-out, underwent bronchoscopy with treatment of mucus plug. Placed on norepi overnight.   Weaned off iNO overnight. Remains intubated FiO2 80%  PEEP 8 . Awake on vent and following commands.   Remains on  milrinone 0.375 mcg, norepi 10 mcg, amio 30  Co-ox 73%. CVP 12   Back in NSR.   Creatinine 1.5 => 1.6=>1.59. 2.8L in UOP yesterday.   Still no BM yet.   Swan #s  CVP 11-12  PA 60/29 (41)   CO 11.5 CI 4.49  CO-OX 73%    Objective:   Weight Range: (!) 138.3 kg Body mass index is 40.23 kg/m.   Vital Signs:   Temp:  [98.4 F (36.9 C)-99.3 F (37.4 C)] 98.4 F (36.9 C) (09/16 0545) Pulse Rate:  [108-114] 114 (09/15 1546) Resp:  [12-31] 30 (09/16 0545) BP: (87-162)/(45-93) 103/61 (09/16 0545) SpO2:  [87 %-99 %] 91 % (09/16 0545) Arterial Line BP: (78-181)/(38-92) 133/53 (09/16 0545) FiO2 (%):  [60 %-100 %] 80 % (09/16 0325) Weight:  [138.3 kg] 138.3 kg (09/16 0500) Last BM Date: 09/05/21  Weight change: Filed Weights   09/12/21 0427 09/13/21 0500 09/14/21 0500  Weight: (!) 141.2 kg (!) 136.7 kg (!) 138.3 kg    Intake/Output:   Intake/Output Summary (Last 24 hours) at 09/14/2021 0708 Last data filed at 09/14/2021 0620 Gross per 24 hour  Intake 3227.25 ml  Output 2865 ml  Net 362.25 ml      Physical Exam   CVP 12  General:  obese, intubated and awake on vent. No distress  HEENT: normal + ETT Neck: supple. thick neck, JVD not well visualized.  +Rt IJ Swan. Carotids 2+ bilat; no bruits. No lymphadenopathy or thyromegaly appreciated. Cor: PMI nondisplaced. Regular rate & rhythm. No rubs, gallops or murmurs. Sternotomy site ok.  Lungs: intubated and clear  Abdomen: obese, mildly distended, nontender, hypoactive bowel sounds. No hepatosplenomegaly. No bruits or masses.  Good bowel sounds. Extremities: no cyanosis, clubbing, rash, 1+ bilateral LEE edema Neuro: intubated but awake on vent and following commands. moves all 4 extremities w/o difficulty. Affect pleasant.  Telemetry   NSR 80s-90s   EKG    N/A  Labs    CBC Recent Labs    09/13/21 0413 09/14/21 0311  WBC 10.6* 9.1  NEUTROABS  --  6.2  HGB 11.4* 10.5*  HCT 36.4* 33.6*  MCV 94.5 95.5  PLT 111* 144*   Basic Metabolic Panel Recent Labs    09/12/21 0325 09/12/21 2301 09/13/21 0413 09/14/21 0311  NA 140   < > 143 144  K 3.9   < > 4.3 3.9  CL 107   < > 110 111  CO2 23   < > 25 26  GLUCOSE 121*   < > 140* 155*  BUN 41*   < > 50* 54*  CREATININE 1.51*   < > 1.60* 1.59*  CALCIUM 8.9   < > 8.9 8.9  MG 2.0   < > 1.9 2.2  PHOS 4.0  --   --   --    < > = values in this interval not displayed.   Liver Function Tests No results for input(s): AST, ALT, ALKPHOS, BILITOT, PROT, ALBUMIN in the last 72 hours.  No results for input(s): LIPASE, AMYLASE in the  last 72 hours. Cardiac Enzymes No results for input(s): CKTOTAL, CKMB, CKMBINDEX, TROPONINI in the last 72 hours.  BNP: BNP (last 3 results) No results for input(s): BNP in the last 8760 hours.  ProBNP (last 3 results) No results for input(s): PROBNP in the last 8760 hours.   D-Dimer No results for input(s): DDIMER in the last 72 hours. Hemoglobin A1C No results for input(s): HGBA1C in the last 72 hours. Fasting Lipid Panel No results for input(s): CHOL, HDL, LDLCALC, TRIG, CHOLHDL, LDLDIRECT in the last 72 hours. Thyroid Function Tests No results for input(s): TSH, T4TOTAL, T3FREE, THYROIDAB in the last 72 hours.  Invalid input(s): FREET3  Other results:   Imaging    DG CHEST PORT 1 VIEW  Result Date: 09/13/2021 CLINICAL DATA:  Respiratory failure EXAM: PORTABLE CHEST 1 VIEW COMPARISON:  09/13/2021 FINDINGS: Unchanged support apparatus. Persistent cardiomegaly with improved interstitial opacity. Left  retrocardiac consolidation is unchanged. IMPRESSION: Improved interstitial edema.  Otherwise unchanged. Electronically Signed   By: Ulyses Jarred M.D.   On: 09/13/2021 19:18     Medications:     Scheduled Medications:  acetylcysteine  2 mL Nebulization TID   arformoterol  15 mcg Nebulization BID   aspirin EC  325 mg Oral Daily   Or   aspirin  324 mg Per Tube Daily   atorvastatin  80 mg Per Tube Daily   bisacodyl  10 mg Oral Daily   Or   bisacodyl  10 mg Rectal Daily   brimonidine  1 drop Both Eyes TID   busPIRone  15 mg Per Tube BID   chlorhexidine gluconate (MEDLINE KIT)  15 mL Mouth Rinse BID   Chlorhexidine Gluconate Cloth  6 each Topical Daily   clonazePAM  1 mg Per Tube BID   docusate  200 mg Per Tube Daily   feeding supplement (PROSource TF)  90 mL Per Tube TID   feeding supplement (VITAL 1.5 CAL)  1,000 mL Per Tube Q24H   fluticasone  2 spray Each Nare Daily   furosemide  40 mg Intravenous BID   insulin aspart  0-20 Units Subcutaneous Q4H   insulin detemir  30 Units Subcutaneous BID   mouth rinse  15 mL Mouth Rinse 10 times per day   pantoprazole sodium  40 mg Per Tube Daily   PARoxetine  60 mg Per Tube Daily   polyethylene glycol  17 g Per Tube Daily   revefenacin  175 mcg Nebulization Daily   senna  1 tablet Per Tube Daily   sildenafil  20 mg Per Tube TID   sodium chloride flush  3 mL Intravenous Q12H   Warfarin - Pharmacist Dosing Inpatient   Does not apply q1600    Infusions:  sodium chloride 10 mL/hr at 09/13/21 0816   sodium chloride     sodium chloride 10 mL/hr at 09/14/21 0500   amiodarone 30 mg/hr (09/14/21 0500)   epinephrine Stopped (09/12/21 0650)   fentaNYL infusion INTRAVENOUS 125 mcg/hr (09/14/21 0500)   lactated ringers 10 mL/hr at 09/14/21 0500   lactated ringers 20 mL/hr at 09/10/21 1517   midazolam Stopped (09/11/21 0944)   milrinone 0.375 mcg/kg/min (09/14/21 0500)   norepinephrine (LEVOPHED) Adult infusion 13 mcg/min (09/14/21 0500)     PRN Medications: sodium chloride, dextrose, fentaNYL, levalbuterol, midazolam, morphine injection, ondansetron (ZOFRAN) IV, sodium chloride flush    Patient Profile   70 y.o. with history of COPD/smoking, chronic severe aortic insufficiency, paroxysmal atrial fibrillation, CAD, type 2 diabetes, and CKD  stage 3.  Patient had PCI to LAD in 12/03, LHC in 7/22 showed only mild CAD.  He has paroxysmal atrial fibrillation and had been maintained on Tikosyn.  He had not been ablated due to obesity.  Patient had lumbar discitis and left sternoclavicular joint infection in 9/20, had MSSA.  He had I&D left [place] joint and IV abx.  Echo at the time showed no vegetation and trivial AI.    HF team consulted for shock/pulmonary hypertension.   Assessment/Plan   1. Aortic insufficiency: TEE pre-op with severe AI, tricuspid aortic valve.  The LV was moderately dilated.  Now s/p bioprosthetic AVR.  The native valve was perforated with possible vegetation.  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  He had MSSA discitis with prolonged course back in 9/20.  Uncertain whether valve changes were chronic or more acute/subacute.  Valve tissue cultures NGTD so far.  2. Shock: Post-op.  Suspect mixed picture with RV dysfunction/severe pulmonary hypertension as well as possible distributive component.  Remains afebrile.  Titrating down pressors. SVR remains low, possible distributive shock component.  - On norepi 10 + milrinone 0.375 mcg.  Should be able to wean back down on NE today.  CO  11 CI 4.5   - CVP 11-12. Continue current dose of IV lasix with rising creatinine.  - Can continue  milrinone and  sildenafil 20 mg tid.  - Completed abx for possible sepsis picture with Rocephin/vancomycin.  3.  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  Post-op echo with LV down 40-45%  (post-correction of severe AI), normal RV, normal bioprosthetic aortic valve. Post-op volume overload, CVP 11.-12  Continue IV lasix. Continue Lasix  40 mg IV bid and follow response. Needs additional diuresis.  4.  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  Has significant lung parenchymal disease, CTA chest in 6/22 with emphysema as well as areas of fibrosis, as well as OSA and ?OHS.  Pre-op was on CPAP and 4L home oxygen.  Initially, peri-operative PA pressure was very high, in the 90/45 range. Now off iNOP. PA pressures 60/29 (41) - Continue sildenafil 20 mg tid.  - Continue milrinone 0.375, would not wean.  5. Acute on chronic hypoxemic respiratory failure: Intubated post-op.  Baseline COPD + OSA + ?OHS and on home oxygen 4 liters. Now with post-op volume overload as well. 9/15 mucus plug with left lung white-out, S/P Bronch with removal of plug - sputum cx pending. Repeat CXR post-bronch significantly improved.  - Vent wean per CCM.  Garlon Hatchet and Yupelri ordered  6. ID: Cultures including tissue culture from valve so far negative.  9/20 MSSA discitis.  ID following. Concern for possible distributive shock component. Blood cultures from 9/9 with NGTD. WBC trending down, afebrile  - now off antibiotics.  -7. AKI on CKD stage 3: Creatinine stable today at 1.6 - Follow with diuresis.  - Daily BMET  8. Atrial fibrillation: Paroxysmal.  On Tikosyn pre-op.  Had Maze procedure.  In NSR currently. Continue amio drip.   - Started on coumadin.    Length of Stay: [address], PA-C  09/14/2021, 7:08 AM  Advanced Heart Failure Team Pager [phone] (M-F; 7a - 5p)  Please contact [facility] for night-coverage after hours (5p -7a ) and weekends on [email or web address]  Patient seen with PA, agree with the above note.  FiO2 still has not totally recovered from presumed mucus plugging episode on 9/15.  Still on FiO2 0.8 with thick secretions.  Febrile, WBCs 9.1.   He  remains on NE at 10 and milrinone 0.375.  Now off iNO and on sildenafil 20 tid.  CVP 11 with PA 60/29 and CI 4.5.  Co-ox 73%.  I/Os positive on Lasix 40 mg IV bid. Creatinine stable at 1.59.  He remains in atrial fibrillation rate 100s on amiodarone gtt 30.    He is awake on vent, follows commands.    General: NAD, awake on vent Neck: Thick, JVP 10, no thyromegaly or thyroid nodule.  Lungs: Crackles CV: Nondisplaced PMI.  Heart irregular S1/S2, no S3/S4, no murmur.  No peripheral edema.   Abdomen: Soft, nontender, no hepatosplenomegaly, no distention.  Skin: Intact without lesions or rashes.  Neurologic: Follows commands Extremities: No clubbing or cyanosis.  HEENT: Normal.     Thick secretions, FiO2 remains high after mucus plugging episode 9/15. Possible HCAP. CVP mildly at 11-12, suspect component of pulmonary edema.  I/Os mildly positive yesterday. - Increase Lasix to 80 mg IV bid today.  - Agree with HCAP coverage, started vancomycin/cefepime.  - Send PCT.    He remains on NE at 10, suspect component of distributive/septic shock.  He is still on milrinone 0.375 with good CI by Luiz Blare, will not change for now given elevated PA pressures.  He has weaned off iNO and is on low dose sildenafil.  - Work on Insurance underwriter today.  - Would not change milrinone.  - If cannot wean NE, may have to stop sildenafil (was started when NE had been almost off).    Platelets rising, likely dropped due to stress/inflammation.    CXR shows left lung re-expanded after mucus plugging event last night.  Should be able to wean down FiO2.    Remains in atrial fibrillation.  Will need eventual DCCV if he does not convert to NSR on his own.  He is on amiodarone gtt, has started warfarin.    Suspect he did not have acute endocarditis, tissue cultures remain negative.  Rheumatological serologic workup negative.   CRITICAL CARE Performed by: Loralie Champagne  Total critical care time: 40 minutes  Critical care  time was exclusive of separately billable procedures and treating other patients.  Critical care was necessary to treat or prevent imminent or life-threatening deterioration.  Critical care was time spent personally by me on the following activities: development of treatment plan with patient and/or surrogate as well as nursing, discussions with consultants, evaluation of patient's response to treatment, examination of patient, obtaining history from patient or surrogate, ordering and performing treatments and interventions, ordering and review of laboratory studies, ordering and review of radiographic studies, pulse oximetry and re-evaluation of patient's condition.  Loralie Champagne 09/14/2021 8:05 AM

## 2021-09-14 NOTE — Progress Notes (Signed)
ANTICOAGULATION CONSULT NOTE - Initial Consult  Pharmacy Consult for warfarin Indication:  bioprosthetic aortic valve replacement and [name] [name] fib  Allergies  Allergen Reactions   Pseudoephedrine Other (See Comments)    Patient went into afib   Diltiazem Rash and Itching    Also 2019   Novocain [Procaine] Hives    Dentist appointment in 1958; since then has tolerated lidocaine and provocaine with no hives or difficulty.   Quinolones     Patient was warned about not using Cipro and similar antibiotics. Recent studies have raised concern that fluoroquinolone antibiotics could be associated with an increased risk of aortic aneurysm Fluoroquinolones have non-antimicrobial properties that might jeopardise the integrity of the extracellular matrix of the vascular wall In [name] [name]  propensity score matched cohort study in [place], there was [name] [name] 66% increased rate of aortic aneurysm or dissection associated with oral fluoroquinolone use, compared wit   Sulfonamide Derivatives Other (See Comments)    Childhood reaction     Patient Measurements: Height: 6' 1"  (185.4 cm) Weight: (!) 138.3 kg (304 lb 14.3 oz) IBW/kg (Calculated) : 79.9  Vital Signs: Temp: 98.4 F (36.9 C) (09/16 0545) Temp Source: Core (09/16 0400) BP: 103/61 (09/16 0545)  Labs: Recent Labs    09/12/21 0325 09/12/21 1545 09/12/21 2301 09/13/21 0254 09/13/21 0413 09/14/21 0311  HGB 11.0*  --   --  10.9* 11.4* 10.5*  HCT 34.5*  --   --  32.0* 36.4* 33.6*  PLT 96*  --   --   --  111* 120*  LABPROT  --  14.6  --   --  15.5* 16.6*  INR  --  1.1  --   --  1.2 1.3*  CREATININE 1.51*  --  1.62*  --  1.60* 1.59*     Estimated Creatinine Clearance: 62.3 mL/min ([name] [name]) (by C-G formula based on SCr of 1.59 mg/dL (H)).   Medical History: Past Medical History:  Diagnosis Date   Anxiety    Atrial fibrillation ([name]) 03/22/2009   [name] [name]. s/p multiple DCCV; b. no coumadin due to low TE risk profile; c. Tikosyn Rx   COPD (chronic obstructive  pulmonary disease) (HCC)    Coronary atherosclerosis of native coronary artery 11/2002   [name] [name]. s/p stent to LAD 12/03; OM2 occluded at cath 12/03; d. myoview 5/10: no ischemia;  e. echo 7/11: EF 55%, BAE, mild RVE, PASP 41-45; Myoview was in March 2013. There was no ischemia or infarction, EF 51%    Cutaneous abscess of back excluding buttocks 07/04/2014   Appears to stem from possibly [name] [name] cyst very large area 6 cm contact surgeon office    Diabetes mellitus without complication ([name])    Drusen body    see opth note   Dyspnea    Dysrhythmia    Epiglottitis    w emergency nt intubation   ERECTILE DYSFUNCTION 03/22/2009   GERD 03/22/2009   Heart murmur    HYPERGLYCEMIA 04/25/2010   HYPERLIPIDEMIA 03/22/2009   Hypertension    Iliac aneurysm ([place])    2.6 to be evaluated incidental finding on CT   LATERAL EPICONDYLITIS, LEFT 10/24/2009   LIVER FUNCTION TESTS, ABNORMAL 04/25/2010   Local reaction to immunization 05/05/2012   minor resolving  zostavax    Myocardial infarction [name]   Numbness in left leg    foot related to back disease and surgery   Obesity, unspecified 04/24/2009   Perforated appendicitis with necrosis s/p open appendectomy 06/07/14 06/04/2014   Renal cyst  Characterized by MRI as simple   Ruptured suppurative appendicitis    2015    SLEEP APNEA, OBSTRUCTIVE 03/22/2009   compliant with CPAP   THROMBOCYTOPENIA 08/16/2010   TOBACCO USE, QUIT 10/24/2009   ULNAR NEUROPATHY, LEFT 43/32/9518   Umbilical hernia     Medications:  Scheduled:   acetylcysteine  2 mL Nebulization TID   arformoterol  15 mcg Nebulization BID   aspirin EC  325 mg Oral Daily   Or   aspirin  324 mg Per Tube Daily   atorvastatin  80 mg Per Tube Daily   bisacodyl  10 mg Oral Daily   Or   bisacodyl  10 mg Rectal Daily   brimonidine  1 drop Both Eyes TID   busPIRone  15 mg Per Tube BID   chlorhexidine gluconate (MEDLINE KIT)  15 mL Mouth Rinse BID   Chlorhexidine Gluconate Cloth  6 each  Topical Daily   clonazePAM  1 mg Per Tube BID   docusate  200 mg Per Tube Daily   feeding supplement (PROSource TF)  90 mL Per Tube TID   feeding supplement (VITAL 1.5 CAL)  1,000 mL Per Tube Q24H   fluticasone  2 spray Each Nare Daily   furosemide  60 mg Intravenous BID   insulin aspart  0-20 Units Subcutaneous Q4H   insulin detemir  30 Units Subcutaneous BID   mouth rinse  15 mL Mouth Rinse 10 times per day   methylnaltrexone  0.15 mg/kg Subcutaneous Q24H   pantoprazole sodium  40 mg Per Tube Daily   PARoxetine  60 mg Per Tube Daily   polyethylene glycol  17 g Per Tube Daily   potassium chloride  40 mEq Per Tube Once   revefenacin  175 mcg Nebulization Daily   senna  1 tablet Per Tube Daily   sildenafil  20 mg Per Tube TID   sodium chloride flush  3 mL Intravenous Q12H   Warfarin - Pharmacist Dosing Inpatient   Does not apply q1600    Assessment: 70 yo M s/p bioprosthetic AVR, atrial clip, and MAZE procedure on 9/8. Patient has [name] [name] PMH of [name] [name] fib on apixaban PTA and has been in [name] [name] fib s/p MAZE procedure currently on an amio drip.   Patient is 138 kg and does not have any high risk characteristics. Baseline INR 1.3 today, increased from 1.2 on day 3 of warfarin. Hgb 10.5 and plts 120. Will continue with 5 mg dose tonight as not yet seeing the full effect of warfarin.  The patient is on ASA 325 mg post-op. Will continue with ASA 325 mg until INR therapeutic per Dr. [name].  Goal of Therapy:  INR 2-3 Monitor platelets by anticoagulation protocol: Yes   Plan:  Warfarin 5 mg PO tonight Daily INR checks Monitor Hgb and platelets [name] [name] [name] [name] [name] [name] 09/14/2021,7:51 AM

## 2021-09-14 NOTE — Progress Notes (Addendum)
[address]       [place] [address]             [phone]      8 Days Post-Op  Procedure(s) (LRB): AORTIC VALVE REPLACEMENT (AVR) USING INSPIRIS AORTIC VALVE 27 MM (N/A) MAZE (N/A) TRANSESOPHAGEAL ECHOCARDIOGRAM (TEE) (N/A) APPLICATION OF CELL SAVER CLIPPING OF ATRIAL APPENDAGE USING ATRICLIP PRO 240   Total Length of Stay:  LOS: 8 days    SUBJECTIVE: Remains on vent but is responsive. RN reports occasional blood staining in suctioned sputum.   Nitric oxide is off, levo being weaned slowly.   FiO2 at 60% with acceptable oxygenation.   Vitals:   09/14/21 1547 09/14/21 1600  BP:  110/67  Pulse:    Resp:  (!) 30  Temp:  98.6 F (37 C)  SpO2: 98% 98%    Intake/Output      09/15 0701 09/16 0700 09/16 0701 09/17 0700   I.V. (mL/kg) 1489.6 (10.8) 553.3 (4)   NG/GT 1687.5 683.3   IV Piggyback 50.2 500.1   Total Intake(mL/kg) 3227.3 (23.3) 1736.6 (12.6)   Urine (mL/kg/hr) 2865 (0.9) 1250 (0.8)   Stool  0   Total Output 2865 1250   Net +362.3 +486.6        Stool Occurrence  1 x       sodium chloride 10 mL/hr at 09/13/21 0816   sodium chloride     sodium chloride 10 mL/hr at 09/14/21 1500   amiodarone 30 mg/hr (09/14/21 1500)   ceFEPime (MAXIPIME) IV Stopped (09/14/21 1052)   epinephrine Stopped (09/12/21 0650)   fentaNYL infusion INTRAVENOUS 75 mcg/hr (09/14/21 1249)   lactated ringers 10 mL/hr at 09/14/21 1500   lactated ringers 20 mL/hr at 09/10/21 1517   midazolam Stopped (09/11/21 0944)   milrinone 0.375 mcg/kg/min (09/14/21 1728)   norepinephrine (LEVOPHED) Adult infusion 8 mcg/min (09/14/21 1500)   [START ON 09/15/2021] vancomycin      CBC    Component Value Date/Time   WBC 9.1 09/14/2021 0311   RBC 3.52 (L) 09/14/2021 0311   HGB 10.5 (L) 09/14/2021 0311   HGB 13.7 07/12/2021 0823   HGB 16.3 03/12/2011 0948   HCT 33.6 (L) 09/14/2021 0311   HCT 42.5 07/12/2021 0823   HCT 46.6 03/12/2011 0948   PLT 120 (L) 09/14/2021  0311   PLT 179 07/12/2021 0823   MCV 95.5 09/14/2021 0311   MCV 90 07/12/2021 0823   MCV 90.9 03/12/2011 0948   MCH 29.8 09/14/2021 0311   MCHC 31.3 09/14/2021 0311   RDW 16.1 (H) 09/14/2021 0311   RDW 13.1 07/12/2021 0823   RDW 13.1 03/12/2011 0948   LYMPHSABS 1.1 09/14/2021 0311   LYMPHSABS 3.4 (H) 12/25/2017 0937   LYMPHSABS 2.7 03/12/2011 0948   MONOABS 1.1 (H) 09/14/2021 0311   MONOABS 0.6 03/12/2011 0948   EOSABS 0.7 (H) 09/14/2021 0311   EOSABS 0.6 (H) 12/25/2017 0937   BASOSABS 0.0 09/14/2021 0311   BASOSABS 0.0 12/25/2017 0937   BASOSABS 0.0 03/12/2011 0948   CMP     Component Value Date/Time   NA 144 09/14/2021 0311   NA 138 07/19/2021 1407   K 3.9 09/14/2021 0311   CL 111 09/14/2021 0311   CO2 26 09/14/2021 0311   GLUCOSE 155 (H) 09/14/2021 0311   BUN 54 (H) 09/14/2021 0311   BUN 30 (H) 07/19/2021 1407   CREATININE 1.59 (H) 09/14/2021 0311   CREATININE 1.21 (H) 09/14/2020 [id number]  CALCIUM 8.9 09/14/2021 0311   PROT 4.9 (L) 09/08/2021 0346   PROT 7.0 04/30/2021 1013   ALBUMIN 3.0 (L) 09/08/2021 0346   ALBUMIN 4.5 04/30/2021 1013   AST 61 (H) 09/08/2021 0346   ALT 14 09/08/2021 0346   ALKPHOS 44 09/08/2021 0346   BILITOT 1.0 09/08/2021 0346   BILITOT 1.0 04/30/2021 1013   GFRNONAA 46 (L) 09/14/2021 0311   GFRNONAA 60 09/14/2020 0754   GFRAA 64 12/04/2020 1444   GFRAA 70 09/14/2020 0754   ABG    Component Value Date/Time   PHART 7.350 09/13/2021 0254   PCO2ART 46.1 09/13/2021 0254   PO2ART 140 (H) 09/13/2021 0254   HCO3 25.4 09/13/2021 0254   TCO2 27 09/13/2021 0254   ACIDBASEDEF 1.0 09/12/2021 0322   O2SAT 72.9 09/14/2021 0312   CBG (last 3)  Recent Labs    09/14/21 0752 09/14/21 1121 09/14/21 1601  GLUCAP 158* 172* 152*     ASSESSMENT: Stable day. Remains in SR.  Continuing supportive care and weaning vasopressor as tolerated. On IV ABX for suspected HCAP.    [name], PA-C  Patient seen and examined, remains critically  ill Agree with above  Remo Lipps C. Roxan Hockey, MD [facility] [phone]

## 2021-09-14 NOTE — Progress Notes (Addendum)
8 Days Post-Op Procedure(s) (LRB): AORTIC VALVE REPLACEMENT (AVR) USING INSPIRIS AORTIC VALVE 27 MM (N/A) MAZE (N/A) TRANSESOPHAGEAL ECHOCARDIOGRAM (TEE) (N/A) APPLICATION OF CELL SAVER CLIPPING OF ATRIAL APPENDAGE USING ATRICLIP PRO 240 Subjective: Remains intubated on vent  Stable night. Milrinone 0.375, NE 13  NO off since 4 am. PA 61/28 CI 4.5, Co-ox 73.   Objective: Vital signs in last 24 hours: Temp:  [98.4 F (36.9 C)-99.3 F (37.4 C)] 98.4 F (36.9 C) (09/16 0545) Pulse Rate:  [108-114] 114 (09/15 1546) Cardiac Rhythm: Sinus tachycardia (09/16 0400) Resp:  [12-31] 30 (09/16 0545) BP: (87-162)/(45-93) 103/61 (09/16 0545) SpO2:  [87 %-99 %] 91 % (09/16 0545) Arterial Line BP: (78-181)/(38-92) 133/53 (09/16 0545) FiO2 (%):  [60 %-100 %] 80 % (09/16 0325) Weight:  [138.3 kg] 138.3 kg (09/16 0500)  Hemodynamic parameters for last 24 hours: PAP: (38-76)/(12-42) 61/34 CO:  [8.3 L/min-9.5 L/min] 9.5 L/min CI:  [3.2 L/min/m2-3.7 L/min/m2] 3.7 L/min/m2  Intake/Output from previous day: 09/15 0701 - 09/16 0700 In: 3227.3 [I.V.:1489.6; VF:090794; IV Piggyback:50.2] Out: 2865 [Urine:2865] Intake/Output this shift: No intake/output data recorded.  General appearance: alert and cooperative Neurologic: intact Heart: irregularly irregular rhythm Lungs: clear to auscultation bilaterally Abdomen: soft, non-tender; bowel sounds normal Extremities: edema moderate Wound: incision healing well.  Lab Results: Recent Labs    09/13/21 0413 09/14/21 0311  WBC 10.6* 9.1  HGB 11.4* 10.5*  HCT 36.4* 33.6*  PLT 111* 120*   BMET:  Recent Labs    09/13/21 0413 09/14/21 0311  NA 143 144  K 4.3 3.9  CL 110 111  CO2 25 26  GLUCOSE 140* 155*  BUN 50* 54*  CREATININE 1.60* 1.59*  CALCIUM 8.9 8.9    PT/INR:  Recent Labs    09/14/21 0311  LABPROT 16.6*  INR 1.3*   ABG    Component Value Date/Time   PHART 7.350 09/13/2021 0254   HCO3 25.4 09/13/2021 0254   TCO2  27 09/13/2021 0254   ACIDBASEDEF 1.0 09/12/2021 0322   O2SAT 72.9 09/14/2021 0312   CBG (last 3)  Recent Labs    09/13/21 1953 09/13/21 2345 09/14/21 0345  GLUCAP 148* 160* 145*    Assessment/Plan: S/P Procedure(s) (LRB): AORTIC VALVE REPLACEMENT (AVR) USING INSPIRIS AORTIC VALVE 27 MM (N/A) MAZE (N/A) TRANSESOPHAGEAL ECHOCARDIOGRAM (TEE) (N/A) APPLICATION OF CELL SAVER CLIPPING OF ATRIAL APPENDAGE USING ATRICLIP PRO 240  POD 8   Hemodynamics have been more stable but still on NE.  CI and Co-ox are good. Continue milrinone and wean NE as tolerated. NO off this am. On Sildenafil for pulmonary HTN.  Discuss removing swan with Dr. Aundra [name] and having PICC line inserted.  VDRF: CCM managing the vent and will decide about when to wean. 80% FiO2.   Stage 3 CKD with baseline creat about 1.5 with postop acute kidney injury due to hypotension. Creat stable at 1.6 today. Continue diuresis.   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 Operative GS showed WBC but no organisms. Operative valve culture and BC negative.   ID following.   Morbid obesity.   DM: glucose under adequate control on Levemir and SSI.   Nutrition: tolerating TF at goal. No BM in a week. KUB this am shows some gas in right colon, overall fairly non-specific. Feeding tube is postpyloric.   Coumadin per pharmacy for atrial fib.   LOS: 8 days    [name] [name] 09/14/2021

## 2021-09-14 NOTE — Progress Notes (Signed)
NAME:  Jas Betten., MRN:  [id number], DOB:  06-06-1951, LOS: 8 ADMISSION DATE:  09/26/2021, CONSULTATION DATE:  09/07/21 REFERRING MD:  Dr. [name], CHIEF COMPLAINT:  Resp failure   History of Present Illness:  HPI obtained from medical chart review as patient is intubated and sedated on mechanical ventilation.   70 year old male with prior history of prior tobacco abuse (quit 2010), centrilobular emphysema, chronic hypoxic respiratory failure on home O2 (?4L [place]), pulmonary hypertension, OSA on CPAP, PAF, CAD w/ LAD stent 2003, HLD, MSSA sepsis c/b lumbar discitis and infected left sternoclavicular joint 08/2019 who recently had worsening exertional dyspnea, orthopnea, and fatigue.  Had TTE in 04/2021 which showed new moderate to severe AI with normal EF 55-60 and new dilation of LV cavity with trivial MR and mild TR, normal RV.  Subsequently, underwent TEE on 07/10/21 which shoed EF 55-60%, mild dilation of ascending aorta, and severe AI.  Underwent [facility]  07/16/21 which showed patent LAD stent, mild diffuse nonobstructive CAD, moderate pulmonary hypertension with PA 67/20 (39) and a pulmonary wedge pressure of 31mHg.  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  Found to have AV vegetations which where sent for culture.  EBL during surgery 3025 ml with 1870 ml in blood products/ cell saver.  Patient did have some trouble transitioning off CPB with high PA pressures with struggling systemic pressures and RV, therefore ended up on NO and vasopressor support with milrinone.  He remains sedated and intubated with tenuous hemodynamics today.  PCCM consulted to help with vent management.  HF also being consulted to help with pulmonary hypertension.    Of note, patient is followed in our pulmonary office by Dr. SHalford Chessman last seen 08/10/21 by BDerl Barrow NP.  Previous CTA 6/22 neg for PE, noted underlying emphysematous changes with areas  of fibrosis and scarring, nodular opacity in RML (smaller now than previous imaging).  Reported compliance with CPAP and anoro.  PFT 08/09/21- FVC 3.42 (75%), FEV1 2. 60 (77%), ratio 76, DLCOunc 10/29 (38%).  Uses O2 2L at rest, and 3-4L with exertions for goal sats > 88-90%.  Pertinent  Medical History  Former Tobacco abuse (quit 2010), centrilobular emphysema, pulmonary hypertension, OSA on CPAP, PAF, CAD w/ LAD stent 2003, HLD, MSSA sepsis c/b lumbar discitis and infected left sternoclavicular joint 08/2019   Significant Hospital Events: Including procedures, antibiotic start and stop dates in addition to other pertinent events   9/8 aortic valve replacement with bioprosthetic valve and biatrial Maze procedure  9/8 ETT >> 9/8 foley >> 9/8 R IJ cordis with PA cath >> 9/8 L radial Aline >> 9/8 L femoral aline (placed in OR given variable L radial tracings)>> 9/8 parasternal pacing wires >> 9/8 mediastinal CT x 2 >>  9/8 aortic tissue cx >> 9/8 aortic tissue AFB >> 9/8 aortic fungal >> 9/9 Bcx 2 >>  9/8 ancef >>9/9 9/8 vanc >> 9/9 cefepime >> 9/15: L lung white out req bronch   Interim History / Subjective:  No events. Ongoing thick secretions. iNO weaned off. No BM  Objective   Blood pressure 103/61, pulse (!) 114, temperature 98.4 F (36.9 C), resp. rate (!) 30, height _0  (1.854 m), weight (!) 138.3 kg, SpO2 91 %. PAP: (38-76)/(12-42) 61/34 CO:  [8.3 L/min-9.5 L/min] 9.5 L/min CI:  [3.2 L/min/m2-3.7 L/min/m2] 3.7 L/min/m2  Vent Mode: PRVC FiO2 (%):  [60 %-100 %] 80 % Set  Rate:  [30 bmp] 30 bmp Vt Set:  [630 mL] 630 mL PEEP:  [8 [name]20-10 [name]20] 8 [name]20 Plateau Pressure:  [21 [name]20-23 [name]20] 22 [name]20   Intake/Output Summary (Last 24 hours) at 09/14/2021 0715 Last data filed at 09/14/2021 0315 Gross per 24 hour  Intake 3227.25 ml  Output 2865 ml  Net 362.25 ml    Filed Weights   09/12/21 0427 09/13/21 0500 09/14/21 0500  Weight: (!) 141.2 kg (!) 136.7 kg (!)  138.3 kg    Examination: No distress +murmur Global mild anasarca Lungs with rhonci, worse on R Abdomen soft, hypoactive BS Moves all 4 ext to command  SvO2 72% BMP stable Plts improved  Resolved Hospital Problem list    Assessment & Plan:   Acute on chronic hypoxic respiratory failure (on home 4L O2 at home) OSA, on home CPAP-- not compliant when sleeping during the day at home.  Remains on high vent settings. 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 New worsening secretions concern for HCAP, BAL pending Decompensated RV failure with shock, PVR preserved on cath in July AI s/p AVR and MAZE procedure Hx PAF, CAD with LAD stent AKI on CKD- improved/stable Thrombocytopenia- improving Ileus vs constipation- recheck KUB, no BM in a week,  Class 3 obesity DM2  - Vent support, VAP prevention bundle, nebs as ordered - f/u BAL data, start HCAP coverage - Check KUB, give dose of relistor, consider reglan if shows e/o ongoing ileus - Still too high O2 needs for SBT - Offload fluid as tolerated - GDMT per CHF team - May need to consider trach to facilitate vent wean  Best Practice (right click and "Reselect all SmartList Selections" daily)   Diet/type: tubefeeds DVT prophylaxis: SCD, defer to TCTS GI prophylaxis: PPI Lines: yes and it is still needed Foley:  Yes, and it is still needed Code Status:  full code Last date of multidisciplinary goals of care discussion [wife updated at bedside 9/ 15]  45 min cc time Erskine Emery MD PCCM

## 2021-09-14 NOTE — Telephone Encounter (Signed)
Prescription refill request for Eliquis received. Indication:atrial fib Last office visit:7/22 Scr:1.6 Age: 70 Weight:138.3 kg  Prescription refilled

## 2021-09-14 NOTE — Progress Notes (Signed)
CSW attempted to visit the patient at bedside to introduce self as the heart failure social worker and to complete a very brief SDOH screening with the patient to address social needs as needed however the patient was unresponsive and unable to engage in conversation at this time due to vent.   CSW will continue to follow for discharge needs and will check back with the patient at another time.  Gita Dilger, MSW, [place] Heart Failure Social Worker

## 2021-09-14 NOTE — Progress Notes (Signed)
Pharmacy Antibiotic Note  [name] [name]. is a 70 y.o. male admitted on 09/27/2021 with AVR. Pt has hx MSSA bacteremia/discitis s/p cefazolin in 2020. Pt w/ question of vegetations on valve leaflets and possible recurrent endocarditis and was treated with vancomycin + cefepime which was de-escalated to vancomycin + ceftriaxone. Antibiotics were stopped per ID as cultures showed no growth.  Patient overnight 9/14-9/15 developed mucus plugging and underwent bronchoscopy. Pharmacy has been consulted for vancomycin and cefepime dosing for possible HCAP.   During suspected endocarditis patient had vancomycin peak and troughs drawn and dose adjusted based upon AUC. (VP of 33 and VT of 17- AUC of 607 on 1250 mg IV q 24h changed to 1000 mg q24 h - estimated AUC 486).  Serum creatine has been relatively stable since then ranging from 1.4 to 1.6 range, therefore will resume doses based upon patient specific 2 level kinetics after loading dose.   Plan: Vancomycin 2000 mg x1 then 1000 mg q24 h (estimated AUC 486) Cefepime 2g IV q 8h Monitor renal fx, cx results, clinical pic  Height: _0  (185.4 cm) Weight: (!) 138.3 kg (304 lb 14.3 oz) IBW/kg (Calculated) : 79.9  Temp (24hrs), Avg:99 F (37.2 C), Min:98.4 F (36.9 C), Max:99.3 F (37.4 C)  Recent Labs  Lab 09/10/21 0600 09/10/21 1415 09/10/21 1620 09/11/21 0329 09/11/21 1118 09/11/21 1823 09/12/21 0325 09/12/21 2301 09/13/21 0413 09/14/21 0311  WBC 10.7*  --   --  8.5  --   --  8.7  --  10.6* 9.1  CREATININE 1.58*  --    < > 1.55*  --  1.57* 1.51* 1.62* 1.60* 1.59*  VANCOTROUGH  --   --   --   --  17  --   --   --   --   --   VANCOPEAK  --  33  --   --   --   --   --   --   --   --    < > = values in this interval not displayed.     Estimated Creatinine Clearance: 62.3 mL/min (A) (by C-G formula based on SCr of 1.59 mg/dL (H)).    Allergies  Allergen Reactions   Pseudoephedrine Other (See Comments)    Patient went into afib    Diltiazem Rash and Itching    Also 2019   Novocain [Procaine] Hives    Dentist appointment in 1958; since then has tolerated lidocaine and provocaine with no hives or difficulty.   Quinolones     Patient was warned about not using Cipro and similar antibiotics. Recent studies have raised concern that fluoroquinolone antibiotics could be associated with an increased risk of aortic aneurysm Fluoroquinolones have non-antimicrobial properties that might jeopardise the integrity of the extracellular matrix of the vascular wall In a  propensity score matched cohort study in [place], there was a 66% increased rate of aortic aneurysm or dissection associated with oral fluoroquinolone use, compared wit   Sulfonamide Derivatives Other (See Comments)    Childhood reaction     Antimicrobials this admission: Cefazolin 9/8 >> 9/9 Vancomycin 9/9 >> 9/16, 9/16 >> Cefepime 9/9 >> 9/12, 9/16>> Ceftriaxone 9/12 >> 9/14  Microbiology results: 9/14 BAL abundant WBCs, rare gram pos cocci in pairs + rare gram neg rods, ngtd 9/8 Valve cx - ngtd 9/9 Bcx - ngtd    Thank you for allowing pharmacy to be a part of this patient's care.  Cathrine Muster, PharmD PGY2 Cardiology Pharmacy  Resident Phone: [phone] 09/14/2021  8:35 AM   Please check AMION for all [place] phone numbers After 10:00 PM, call [place] [phone]

## 2021-09-15 ENCOUNTER — Inpatient Hospital Stay (HOSPITAL_COMMUNITY): Payer: Medicare Other

## 2021-09-15 DIAGNOSIS — Z952 Presence of prosthetic heart valve: Secondary | ICD-10-CM | POA: Diagnosis not present

## 2021-09-15 DIAGNOSIS — I351 Nonrheumatic aortic (valve) insufficiency: Secondary | ICD-10-CM | POA: Diagnosis not present

## 2021-09-15 DIAGNOSIS — I2721 Secondary pulmonary arterial hypertension: Secondary | ICD-10-CM | POA: Diagnosis not present

## 2021-09-15 LAB — COOXEMETRY PANEL
Carboxyhemoglobin: 1.4 % (ref 0.5–1.5)
Carboxyhemoglobin: 1.3 % (ref 0.5–1.5)
Methemoglobin: 1 % (ref 0.0–1.5)
Methemoglobin: 0.8 % (ref 0.0–1.5)
O2 Saturation: 93.6 %
O2 Saturation: 68.8 %
Total hemoglobin: 10.9 g/dL — ABNORMAL LOW (ref 12.0–16.0)
Total hemoglobin: 11.1 g/dL — ABNORMAL LOW (ref 12.0–16.0)

## 2021-09-15 LAB — BASIC METABOLIC PANEL
Anion gap: 10 (ref 5–15)
BUN: 60 mg/dL — ABNORMAL HIGH (ref 8–23)
CO2: 25 mmol/L (ref 22–32)
Calcium: 8.9 mg/dL (ref 8.9–10.3)
Chloride: 108 mmol/L (ref 98–111)
Creatinine, Ser: 1.63 mg/dL — ABNORMAL HIGH (ref 0.61–1.24)
GFR, Estimated: 45 mL/min — ABNORMAL LOW (ref >60)
Glucose, Bld: 180 mg/dL — ABNORMAL HIGH (ref 70–99)
Potassium: 4.1 mmol/L (ref 3.5–5.1)
Sodium: 143 mmol/L (ref 135–145)

## 2021-09-15 LAB — CBC
HCT: 32.3 % — ABNORMAL LOW (ref 39.0–52.0)
Hemoglobin: 10.5 g/dL — ABNORMAL LOW (ref 13.0–17.0)
MCH: 30.7 pg (ref 26.0–34.0)
MCHC: 32.5 g/dL (ref 30.0–36.0)
MCV: 94.4 fL (ref 80.0–100.0)
Platelets: 132 10*3/uL — ABNORMAL LOW (ref 150–400)
RBC: 3.42 MIL/uL — ABNORMAL LOW (ref 4.22–5.81)
RDW: 16.3 % — ABNORMAL HIGH (ref 11.5–15.5)
WBC: 9.8 10*3/uL (ref 4.0–10.5)
nRBC: 0 % (ref 0.0–0.2)

## 2021-09-15 LAB — CULTURE, BAL-QUANTITATIVE W GRAM STAIN: Culture: 60000 — AB

## 2021-09-15 LAB — MAGNESIUM: Magnesium: 2.1 mg/dL (ref 1.7–2.4)

## 2021-09-15 LAB — GLUCOSE, CAPILLARY
Glucose-Capillary: 152 mg/dL — ABNORMAL HIGH (ref 70–99)
Glucose-Capillary: 154 mg/dL — ABNORMAL HIGH (ref 70–99)
Glucose-Capillary: 155 mg/dL — ABNORMAL HIGH (ref 70–99)
Glucose-Capillary: 156 mg/dL — ABNORMAL HIGH (ref 70–99)
Glucose-Capillary: 165 mg/dL — ABNORMAL HIGH (ref 70–99)
Glucose-Capillary: 188 mg/dL — ABNORMAL HIGH (ref 70–99)

## 2021-09-15 LAB — PROCALCITONIN: Procalcitonin: 0.28 ng/mL

## 2021-09-15 LAB — PROTIME-INR
INR: 1.6 — ABNORMAL HIGH (ref 0.8–1.2)
Prothrombin Time: 19 seconds — ABNORMAL HIGH (ref 11.4–15.2)

## 2021-09-15 MED ORDER — WARFARIN SODIUM 5 MG PO TABS
5.0000 mg | ORAL_TABLET | Freq: Once | ORAL | Status: AC
  Administered 2021-09-15: 5 mg

## 2021-09-15 MED ORDER — ROCURONIUM BROMIDE 10 MG/ML (PF) SYRINGE
PREFILLED_SYRINGE | INTRAVENOUS | Status: AC
  Administered 2021-09-15: 50 mg
  Filled 2021-09-15: qty 10

## 2021-09-15 MED ORDER — POTASSIUM CHLORIDE 20 MEQ PO PACK
40.0000 meq | PACK | Freq: Once | ORAL | Status: AC
  Administered 2021-09-15: 40 meq
  Filled 2021-09-15: qty 2

## 2021-09-15 MED ORDER — ETOMIDATE 2 MG/ML IV SOLN
INTRAVENOUS | Status: AC
  Administered 2021-09-15: 20 mg
  Filled 2021-09-15: qty 20

## 2021-09-15 MED ORDER — SORBITOL 70 % SOLN
30.0000 mL | Freq: Once | Status: AC
  Administered 2021-09-15: 30 mL
  Filled 2021-09-15: qty 30

## 2021-09-15 MED ORDER — MIDODRINE HCL 5 MG PO TABS
5.0000 mg | ORAL_TABLET | Freq: Three times a day (TID) | ORAL | Status: DC
  Administered 2021-09-15 (×2): 5 mg
  Filled 2021-09-15 (×2): qty 1

## 2021-09-15 MED ORDER — WARFARIN SODIUM 5 MG PO TABS
5.0000 mg | ORAL_TABLET | Freq: Once | ORAL | Status: DC
  Filled 2021-09-15: qty 1

## 2021-09-15 MED ORDER — MIDAZOLAM HCL 2 MG/2ML IJ SOLN
INTRAMUSCULAR | Status: AC
  Administered 2021-09-15: 2 mg
  Filled 2021-09-15: qty 2

## 2021-09-15 MED ORDER — FUROSEMIDE 10 MG/ML IJ SOLN
8.0000 mg/h | INTRAVENOUS | Status: DC
  Administered 2021-09-15 – 2021-09-17 (×4): 12 mg/h via INTRAVENOUS
  Administered 2021-09-18: 8 mg/h via INTRAVENOUS
  Filled 2021-09-15 (×7): qty 20

## 2021-09-15 NOTE — Progress Notes (Signed)
[facility] for warfarin Indication:  bioprosthetic aortic valve replacement and a fib  Allergies  Allergen Reactions   Pseudoephedrine Other (See Comments)    Patient went into afib   Diltiazem Rash and Itching    Also 2019   Novocain [Procaine] Hives    Dentist appointment in 1958; since then has tolerated lidocaine and provocaine with no hives or difficulty.   Quinolones     Patient was warned about not using Cipro and similar antibiotics. Recent studies have raised concern that fluoroquinolone antibiotics could be associated with an increased risk of aortic aneurysm Fluoroquinolones have non-antimicrobial properties that might jeopardise the integrity of the extracellular matrix of the vascular wall In a  propensity score matched cohort study in [place], there was a 66% increased rate of aortic aneurysm or dissection associated with oral fluoroquinolone use, compared wit   Sulfonamide Derivatives Other (See Comments)    Childhood reaction     Patient Measurements: Height: 6' 1"  (185.4 cm) Weight: (!) 141.2 kg (311 lb 4.6 oz) IBW/kg (Calculated) : 79.9  Vital Signs: Temp: 97.7 F (36.5 C) (09/17 0645) Temp Source: Core (Comment) (09/17 0430) BP: 92/51 (09/17 0645) Pulse Rate: 110 (09/17 0000)  Labs: Recent Labs    09/13/21 0413 09/14/21 0311 09/14/21 2258 09/15/21 0359  HGB 11.4* 10.5* 10.5* 10.5*  HCT 36.4* 33.6* 31.0* 32.3*  PLT 111* 120*  --  132*  LABPROT 15.5* 16.6*  --  19.0*  INR 1.2 1.3*  --  1.6*  CREATININE 1.60* 1.59*  --  1.63*     Estimated Creatinine Clearance: 61.4 mL/min (A) (by C-G formula based on SCr of 1.63 mg/dL (H)).   Medical History: Past Medical History:  Diagnosis Date   Anxiety    Atrial fibrillation ([place]) 03/22/2009   a. s/p multiple DCCV; b. no coumadin due to low TE risk profile; c. Tikosyn Rx   COPD (chronic obstructive pulmonary disease) (H[facility])    Coronary atherosclerosis of native coronary  artery 11/2002   a. s/p stent to LAD 12/03; OM2 occluded at cath 12/03; d. myoview 5/10: no ischemia;  e. echo 7/11: EF 55%, BAE, mild RVE, PASP 41-45; Myoview was in March 2013. There was no ischemia or infarction, EF 51%    Cutaneous abscess of back excluding buttocks 07/04/2014   Appears to stem from possibly a cyst very large area 6 cm contact surgeon office    Diabetes mellitus without complication ([place])    Drusen body    see opth note   Dyspnea    Dysrhythmia    Epiglottitis    w emergency nt intubation   ERECTILE DYSFUNCTION 03/22/2009   GERD 03/22/2009   Heart murmur    HYPERGLYCEMIA 04/25/2010   HYPERLIPIDEMIA 03/22/2009   Hypertension    Iliac aneurysm ([place])    2.6 to be evaluated incidental finding on CT   LATERAL EPICONDYLITIS, LEFT 10/24/2009   LIVER FUNCTION TESTS, ABNORMAL 04/25/2010   Local reaction to immunization 05/05/2012   minor resolving  zostavax    Myocardial infarction [facility]) mi2003   Numbness in left leg    foot related to back disease and surgery   Obesity, unspecified 04/24/2009   Perforated appendicitis with necrosis s/p open appendectomy 06/07/14 06/04/2014   Renal cyst    Characterized by MRI as simple   Ruptured suppurative appendicitis    2015    SLEEP APNEA, OBSTRUCTIVE 03/22/2009   compliant with CPAP   THROMBOCYTOPENIA 08/16/2010   TOBACCO USE,  QUIT 10/24/2009   ULNAR NEUROPATHY, LEFT 16/09/9603   Umbilical hernia     Medications:  Scheduled:   acetylcysteine  2 mL Nebulization TID   arformoterol  15 mcg Nebulization BID   aspirin EC  325 mg Oral Daily   Or   aspirin  324 mg Per Tube Daily   atorvastatin  80 mg Per Tube Daily   bisacodyl  10 mg Oral Daily   Or   bisacodyl  10 mg Rectal Daily   brimonidine  1 drop Both Eyes TID   busPIRone  15 mg Per Tube BID   chlorhexidine gluconate (MEDLINE KIT)  15 mL Mouth Rinse BID   Chlorhexidine Gluconate Cloth  6 each Topical Daily   clonazePAM  1 mg Per Tube BID   docusate  200 mg Per  Tube Daily   feeding supplement (PROSource TF)  90 mL Per Tube TID   feeding supplement (VITAL 1.5 CAL)  1,000 mL Per Tube Q24H   fluticasone  2 spray Each Nare Daily   furosemide  80 mg Intravenous BID   insulin aspart  0-20 Units Subcutaneous Q4H   insulin detemir  30 Units Subcutaneous BID   mouth rinse  15 mL Mouth Rinse 10 times per day   pantoprazole sodium  40 mg Per Tube Daily   PARoxetine  60 mg Per Tube Daily   polyethylene glycol  17 g Per Tube Daily   revefenacin  175 mcg Nebulization Daily   senna  2 tablet Per Tube QHS   sodium chloride flush  3 mL Intravenous Q12H   Warfarin - Pharmacist Dosing Inpatient   Does not apply q1600    Assessment: 70 yo M s/p bioprosthetic AVR, atrial clip, and MAZE procedure on 9/8. Patient has a PMH of a fib on apixaban PTA and has been in a fib s/p MAZE procedure currently on an amio drip.   INR continues to rise appropriately to 1.6 today, CBC stable.  Goal of Therapy:  INR 2-3 Monitor platelets by anticoagulation protocol: Yes   Plan:  Warfarin 73m x1 tonight Daily protime  MArrie Senate PharmD, BCPS, BWinter Haven Women'S HospitalClinical Pharmacist [phone] check AMION for all MBarnumnumbers 09/15/2021

## 2021-09-15 NOTE — Progress Notes (Signed)
1613: RN walked out of patient room to find other staff to assist after noticing pt had an incontinence of bowel episode.   1615: RN walked back into patient room to await assistance and patient was agitated and reaching towards ETT. RN tried to stop patient from grabbing tube but RN was unsuccessful. RN yelled for help. Patient had strong grip on ETT and RN was unable to pry fingers loose from ETT. Patient self extubated. Several RNs and RT responded. CCM contacted. Dr. Valeta Harms came to bedside. Patient sats continued to decline. Decision was made by CCM to reintubate. RSI meds administered by Susie Cassette, See Mar. Patient reintubated by Dr. Valeta Harms. Sats improved on 100% FiO2. Family notified. RN will continue to monitor.   Alma Friendly RN

## 2021-09-15 NOTE — Progress Notes (Signed)
RT called to pt room emergently. Upon arrival pt self extubated SpO2 60% on room air. Pt unable to follow commands. Pt given 100% FiO2 by BMV and Dr. Valeta Harms called for intubation. Pt intubated by CCM 8.0 at 26cm at the lip. Pt placed on previous vent settings. CXR pending. FiO2 and peep increased to 100%/+12. Will wean as tolerated. RT will continue to monitor and be available as needed.

## 2021-09-15 NOTE — Progress Notes (Signed)
NAME:  [name] [name]., MRN:  [id number], DOB:  [birth year], LOS: 9 ADMISSION DATE:  09/13/2021, CONSULTATION DATE:  09/07/21 REFERRING MD:  Dr. [name], CHIEF COMPLAINT:  Resp failure   History of Present Illness:  HPI obtained from medical chart review as patient is intubated and sedated on mechanical ventilation.   70 year old male with prior history of prior tobacco abuse (quit 2010), centrilobular emphysema, chronic hypoxic respiratory failure on home O2 (?4L [place]), pulmonary hypertension, OSA on CPAP, PAF, CAD w/ LAD stent 2003, HLD, MSSA sepsis c/b lumbar discitis and infected left sternoclavicular joint 08/2019 who recently had worsening exertional dyspnea, orthopnea, and fatigue.  Had TTE in 04/2021 which showed new moderate to severe AI with normal EF 55-60 and new dilation of LV cavity with trivial MR and mild TR, normal RV.  Subsequently, underwent TEE on 07/10/21 which shoed EF 55-60%, mild dilation of ascending aorta, and severe AI.  Underwent [facility]  07/16/21 which showed patent LAD stent, mild diffuse nonobstructive CAD, moderate pulmonary hypertension with PA 67/20 (39) and a pulmonary wedge pressure of 57mHg.  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  Found to have AV vegetations which where sent for culture.  EBL during surgery 3025 ml with 1870 ml in blood products/ cell saver.  Patient did have some trouble transitioning off CPB with high PA pressures with struggling systemic pressures and RV, therefore ended up on NO and vasopressor support with milrinone.  He remains sedated and intubated with tenuous hemodynamics today.  PCCM consulted to help with vent management.  HF also being consulted to help with pulmonary hypertension.    Of note, patient is followed in our pulmonary office by Dr. SHalford Chessman last seen 08/10/21 by BDerl Barrow NP.  Previous CTA 6/22 neg for PE, noted underlying emphysematous changes with areas  of fibrosis and scarring, nodular opacity in RML (smaller now than previous imaging).  Reported compliance with CPAP and anoro.  PFT 08/09/21- FVC 3.42 (75%), FEV1 2. 60 (77%), ratio 76, DLCOunc 10/29 (38%).  Uses O2 2L at rest, and 3-4L with exertions for goal sats > 88-90%.  Pertinent  Medical History  Former Tobacco abuse (quit 2010), centrilobular emphysema, pulmonary hypertension, OSA on CPAP, PAF, CAD w/ LAD stent 2003, HLD, MSSA sepsis c/b lumbar discitis and infected left sternoclavicular joint 08/2019   Significant Hospital Events: Including procedures, antibiotic start and stop dates in addition to other pertinent events   9/8 aortic valve replacement with bioprosthetic valve and biatrial Maze procedure 9/8 ETT >> 9/8 foley >> 9/8 R IJ cordis with PA cath >> 9/8 L radial Aline >> 9/8 L femoral aline (placed in OR given variable L radial tracings)>> 9/8 parasternal pacing wires >> 9/8 mediastinal CT x 2 >>  9/8 aortic tissue cx >> 9/8 aortic tissue AFB >> 9/8 aortic fungal >> 9/9 Bcx 2 >>  9/8 ancef >>9/9 9/8 vanc >> 9/9 cefepime >> 9/15: L lung white out req bronch  9/17: off nitric, lasix ggt   Interim History / Subjective:    Remains critically ill. Intubated on life support.   Objective   Blood pressure (!) 92/51, pulse (!) 110, temperature 97.7 F (36.5 C), resp. rate (!) 30, height _0  (1.854 m), weight (!) 141.2 kg, SpO2 100 %. PAP: (43-87)/(24-37) 47/27 CVP:  [14 mmHg] 14 mmHg CO:  [12.2 L/min-13.5 L/min] 13.5 L/min CI:  [4.8 L/min/m2-5.3 L/min/m2] 5.3 L/min/m2  Vent Mode: PRVC FiO2 (%):  [60 %-100 %] 70 % Set Rate:  [30 bmp] 30 bmp Vt Set:  [630 mL] 630 mL PEEP:  [8 [name]20] 8 [name]20 Plateau Pressure:  [21 [name]20-24 [name]20] 24 [name]20   Intake/Output Summary (Last 24 hours) at 09/15/2021 1015 Last data filed at 09/15/2021 0045 Gross per 24 hour  Intake 3154.46 ml  Output 3290 ml  Net -135.54 ml   Filed Weights   09/13/21 0500 09/14/21 0500 09/15/21 0500   Weight: (!) 136.7 kg (!) 138.3 kg (!) 141.2 kg    Examination: General appearance: 70 y.o., male, critically ill, intubated on life support  HENT: NCAT tracking, ett in place  Lungs: BL vented breaths  CV: RRR, s1 s2  Abdomen: Soft nt nd  Extremities: BL dependent edema  Neuro: wakes  up to voice   Resolved Hospital Problem list    Assessment & Plan:   Acute on chronic hypoxic respiratory failure (on home 4L O2 at home) OSA, on home CPAP-- not compliant when sleeping during the day at home.  Remains on high vent settings. 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 New worsening secretions concern for HCAP, BAL w/ normal flora  Decompensated RV failure with shock, PVR preserved on cath in July AI s/p AVR and MAZE procedure Hx PAF, CAD with LAD stent AKI on CKD- improved/stable Thrombocytopenia- improving Ileus vs constipation- recheck KUB, no BM in a week,  Class 3 obesity DM2  P: - remains on full vent support  - VAP ppx  - weaning peep and fio2  - meds from CHF team  - possible need for trach but we need told off on this, he looks like he is getting better slowly   Best Practice (right click and "Reselect all SmartList Selections" daily)   Diet/type: tubefeeds DVT prophylaxis: SCD, defer to TCTS GI prophylaxis: PPI Lines: yes and it is still needed Foley:  Yes, and it is still needed Code Status:  full code Last date of multidisciplinary goals of care discussion [wife updated at bedside on 9/17]  This patient is critically ill with multiple organ system failure; which, requires frequent high complexity decision making, assessment, support, evaluation, and titration of therapies. This was completed through the application of advanced monitoring technologies and extensive interpretation of multiple databases. During this encounter critical care time was devoted to patient care services described in this note for 33  minutes.  [facility] 09/15/2021 10:15 AM

## 2021-09-15 NOTE — Progress Notes (Signed)
      [address]       [place] [address]             [phone]      Events earlier noted Self extubated Was reintubated urgently about an hour ago  Currently sedated BP 120/68   Pulse (!) 114   Temp 98.1 F (36.7 C) (Oral)   Resp (!) 33   Ht '6\' 1"'$  (1.854 m)   Wt (!) 141.2 kg   SpO2 95%   BMI 41.07 kg/m  59/34 CI= 3.6 on milrinone 0.25 and nrepi at 13 Vent on 100% with 12 PEEP   Intake/Output Summary (Last 24 hours) at 09/15/2021 1748 Last data filed at 09/15/2021 1500 Gross per 24 hour  Intake 2433.67 ml  Output 2195 ml  Net 238.67 ml   Vent per CCM Continues with high Co although not as extreme after medication changes  Remo Lipps C. Roxan Hockey, MD [facility] [phone]

## 2021-09-15 NOTE — Progress Notes (Signed)
Advanced Heart Failure Rounding Note  PCP-Cardiologist: [name], MD   Subjective:    9/11: Echo EF 40-45% RV ok. RVSP 22.8  9/14 - 9/15 Developed hypoxia, CXR with left lung white-out, underwent bronchoscopy with treatment of mucus plug. Placed on norepi overnight.   Weaned off iNO on 9/16.  Remains intubated FiO2 80% -> 70%  Awake on vent and following commands.   Remains on  milrinone 0.375 mcg, norepi 8 mcg, amio 30  Co-ox 69%.   Vanc/cefepime restarted 9/16. For HCAP   Creatinine 1.5 => 1.6=>1.59. 2.8L in UOP yesterday.    Luiz Blare #s done personally at bedside CVP 14 PA 61/33 (44) PCWP 15 PVR 2.7 SVR 527 PAPi 2.0 CO 9.7 CI 3.9  CO-OX 69%    Objective:   Weight Range: (!) 141.2 kg Body mass index is 41.07 kg/m.   Vital Signs:   Temp:  [97.7 F (36.5 C)-99 F (37.2 C)] 97.7 F (36.5 C) (09/17 0645) Pulse Rate:  [88-110] 110 (09/17 0000) Resp:  [20-30] 30 (09/17 0645) BP: (79-148)/(43-88) 92/51 (09/17 0645) SpO2:  [90 %-100 %] 100 % (09/17 0645) Arterial Line BP: (79-214)/(34-80) 113/45 (09/17 0645) FiO2 (%):  [60 %-100 %] 70 % (09/17 0416) Weight:  [141.2 kg] 141.2 kg (09/17 0500) Last BM Date: 09/05/21  Weight change: Filed Weights   09/13/21 0500 09/14/21 0500 09/15/21 0500  Weight: (!) 136.7 kg (!) 138.3 kg (!) 141.2 kg    Intake/Output:   Intake/Output Summary (Last 24 hours) at 09/15/2021 0903 Last data filed at 09/15/2021 0616 Gross per 24 hour  Intake 3573.34 ml  Output 3515 ml  Net 58.34 ml       Physical Exam   CVP 12  General:  obese, intubated and awake on vent. No distress  HEENT: normal + ETT Neck: supple. thick neck, JVD not well visualized.  +Rt IJ Swan. Carotids 2+ bilat; no bruits. No lymphadenopathy or thyromegaly appreciated. Cor: PMI nondisplaced. Regular rate & rhythm. No rubs, gallops or murmurs. Sternotomy site ok.  Lungs: intubated and clear  Abdomen: obese, mildly distended, nontender, hypoactive bowel  sounds. No hepatosplenomegaly. No bruits or masses. Good bowel sounds. Extremities: no cyanosis, clubbing, rash, 1+ bilateral LEE edema Neuro: intubated but awake on vent and following commands. moves all 4 extremities w/o difficulty. Affect pleasant.  Telemetry   NSR 80s-90s   EKG    N/A  Labs    CBC Recent Labs    09/14/21 0311 09/14/21 2258 09/15/21 0359  WBC 9.1  --  9.8  NEUTROABS 6.2  --   --   HGB 10.5* 10.5* 10.5*  HCT 33.6* 31.0* 32.3*  MCV 95.5  --  94.4  PLT 120*  --  132*    Basic Metabolic Panel Recent Labs    09/14/21 0311 09/14/21 2258 09/15/21 0359  NA 144 145 143  K 3.9 4.1 4.1  CL 111  --  108  CO2 26  --  25  GLUCOSE 155*  --  180*  BUN 54*  --  60*  CREATININE 1.59*  --  1.63*  CALCIUM 8.9  --  8.9  MG 2.2  --  2.1    Liver Function Tests No results for input(s): AST, ALT, ALKPHOS, BILITOT, PROT, ALBUMIN in the last 72 hours.  No results for input(s): LIPASE, AMYLASE in the last 72 hours. Cardiac Enzymes No results for input(s): CKTOTAL, CKMB, CKMBINDEX, TROPONINI in the last 72 hours.  BNP: BNP (last 3 results)  No results for input(s): BNP in the last 8760 hours.  ProBNP (last 3 results) No results for input(s): PROBNP in the last 8760 hours.   D-Dimer No results for input(s): DDIMER in the last 72 hours. Hemoglobin A1C No results for input(s): HGBA1C in the last 72 hours. Fasting Lipid Panel No results for input(s): CHOL, HDL, LDLCALC, TRIG, CHOLHDL, LDLDIRECT in the last 72 hours. Thyroid Function Tests No results for input(s): TSH, T4TOTAL, T3FREE, THYROIDAB in the last 72 hours.  Invalid input(s): FREET3  Other results:   Imaging    DG CHEST PORT 1 VIEW  Result Date: 09/15/2021 CLINICAL DATA:  Chest congestion.  History of COPD. EXAM: PORTABLE CHEST 1 VIEW COMPARISON:  September 13, 2021 FINDINGS: The ET tube is in good position. The distal feeding tube terminates below today's film. No pneumothorax. Pulmonary  edema identified. The PA catheter is stable. No other acute interval changes. IMPRESSION: 1. Support apparatus as above, unchanged. 2. Pulmonary edema, worsened in the interval. 3. No other interval changes. Electronically Signed   By: Dorise Bullion III M.D.   On: 09/15/2021 08:56     Medications:     Scheduled Medications:  acetylcysteine  2 mL Nebulization TID   arformoterol  15 mcg Nebulization BID   aspirin EC  325 mg Oral Daily   Or   aspirin  324 mg Per Tube Daily   atorvastatin  80 mg Per Tube Daily   bisacodyl  10 mg Oral Daily   Or   bisacodyl  10 mg Rectal Daily   brimonidine  1 drop Both Eyes TID   busPIRone  15 mg Per Tube BID   chlorhexidine gluconate (MEDLINE KIT)  15 mL Mouth Rinse BID   Chlorhexidine Gluconate Cloth  6 each Topical Daily   clonazePAM  1 mg Per Tube BID   docusate  200 mg Per Tube Daily   feeding supplement (PROSource TF)  90 mL Per Tube TID   feeding supplement (VITAL 1.5 CAL)  1,000 mL Per Tube Q24H   fluticasone  2 spray Each Nare Daily   furosemide  80 mg Intravenous BID   insulin aspart  0-20 Units Subcutaneous Q4H   insulin detemir  30 Units Subcutaneous BID   mouth rinse  15 mL Mouth Rinse 10 times per day   pantoprazole sodium  40 mg Per Tube Daily   PARoxetine  60 mg Per Tube Daily   polyethylene glycol  17 g Per Tube Daily   revefenacin  175 mcg Nebulization Daily   senna  2 tablet Per Tube QHS   sildenafil  20 mg Per Tube TID   sodium chloride flush  3 mL Intravenous Q12H   Warfarin - Pharmacist Dosing Inpatient   Does not apply q1600    Infusions:  sodium chloride 10 mL/hr at 09/13/21 0816   sodium chloride     sodium chloride 10 mL/hr at 09/15/21 0600   amiodarone 30 mg/hr (09/15/21 0737)   ceFEPime (MAXIPIME) IV 2 g (09/15/21 0947)   epinephrine Stopped (09/12/21 0650)   fentaNYL infusion INTRAVENOUS 125 mcg/hr (09/15/21 0600)   lactated ringers 10 mL/hr at 09/15/21 0600   lactated ringers 20 mL/hr at 09/10/21 1517    midazolam Stopped (09/11/21 0944)   milrinone 0.375 mcg/kg/min (09/15/21 0600)   norepinephrine (LEVOPHED) Adult infusion 10 mcg/min (09/15/21 0600)   vancomycin Stopped (09/15/21 0118)    PRN Medications: sodium chloride, dextrose, fentaNYL, levalbuterol, midazolam, morphine injection, ondansetron (ZOFRAN) IV, sodium chloride flush  Patient Profile   70 y.o. with history of COPD/smoking, chronic severe aortic insufficiency, paroxysmal atrial fibrillation, CAD, type 2 diabetes, and CKD stage 3.  Patient had PCI to LAD in 12/03, LHC in 7/22 showed only mild CAD.  He has paroxysmal atrial fibrillation and had been maintained on Tikosyn.  He had not been ablated due to obesity.  Patient had lumbar discitis and left sternoclavicular joint infection in 9/20, had MSSA.  He had I&D left [place] joint and IV abx.  Echo at the time showed no vegetation and trivial AI.    HF team consulted for shock/pulmonary hypertension.   Assessment/Plan   1. Aortic insufficiency: TEE pre-op with severe AI, tricuspid aortic valve.  The LV was moderately dilated.  Now s/p bioprosthetic AVR.  The native valve was perforated with possible vegetation.  Suspect he did not have acute endocarditis, tissue cultures remain negative.  Rheumatological serologic workup negative.  - continue supportive care 2. Shock: Post-op.  Suspect mixed picture with RV dysfunction/severe pulmonary hypertension as well as possible distributive component.  Remains afebrile.  Titrating down pressors. SVR remains low, possible distributive shock component.  - On norepi 8 + milrinone 0.375 mcg.  Cardiac output high. With low SVR will cut back milrinone to 0.25. Will add midodrine to help wean NE - Remains on lasix 80 IV bid. Made 3.5L overnight. SCr stable at 1.6 Switch to lasix gtt at 12 - Will hold sildenafil for now with low SVR and stable pulmonary pressures.  - Completed abx for possible sepsis picture with Rocephin/vancomycin. Vanc/cefepime  restarted 9/16. For HCAP  3.  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  Post-op echo with LV down 40-45% (post-correction of severe AI), normal RV, normal bioprosthetic aortic valve. Post-op volume overload, CVP 15 - Remains volume overloaded. Continue IV lasix  4.  Pulmonary hypertension: Suspect mixed group 2/3 pulmonary hypertension + high output.  Has significant lung parenchymal disease, CTA chest in 6/22 with emphysema as well as areas of fibrosis, as well as OSA and ?OHS.  Pre-op was on CPAP and 4L home oxygen.  Initially, peri-operative PA pressure was very high, in the 90/45 range. Now off iNOP. PA pressures stable at 61/33 PVR normal. Suspect high output contributing - Stop sildenafil  - With low SVR will drop milrinone to 0.25 5. Acute on chronic hypoxemic respiratory failure: Intubated post-op.  Baseline COPD + OSA + ?OHS and on home oxygen 4 liters. Now with post-op volume overload as well. 9/15 mucus plug with left lung white-out, S/P Bronch with removal of plug - sputum cx pending. Repeat CXR post-bronch significantly improved.  - Vent wean per CCM.  Garlon Hatchet and Yupelri ordered  - FiO2 80% -> 70% 6. ID: Cultures including tissue culture from valve so far negative.  9/20 MSSA discitis.  ID following. Concern for possible distributive shock component. Blood cultures from 9/9 with NGTD. WBC trending down, afebrile  - now off antibiotics.  -7. AKI on CKD stage 3: Creatinine stable today at 1.6 - Follow with diuresis.  - Daily BMET  8. Atrial fibrillation: Paroxysmal.  On Tikosyn pre-op.  Had Maze procedure. In AF currently. Rate controlled Continue amio drip.   - Started on coumadin. INR 1.6 today  CRITICAL CARE Performed by: Glori Bickers  Total critical care time: 35 minutes  Critical care time was exclusive of separately  billable  procedures and treating other patients.  Critical care was necessary to treat or prevent imminent or life-threatening deterioration.  Critical care was time spent personally by me (independent of midlevel providers or residents) on the following activities: development of treatment plan with patient and/or surrogate as well as nursing, discussions with consultants, evaluation of patient's response to treatment, examination of patient, obtaining history from patient or surrogate, ordering and performing treatments and interventions, ordering and review of laboratory studies, ordering and review of radiographic studies, pulse oximetry and re-evaluation of patient's condition.  Length of Stay: Dante, MD  09/15/2021, 9:03 AM  Advanced Heart Failure Team Pager [phone] (M-F; 7a - 5p)  Please contact [facility] for night-coverage after hours (5p -7a ) and weekends on [email or web address]

## 2021-09-15 NOTE — Progress Notes (Signed)
9 Days Post-Op Procedure(s) (LRB): AORTIC VALVE REPLACEMENT (AVR) USING INSPIRIS AORTIC VALVE 27 MM (N/A) MAZE (N/A) TRANSESOPHAGEAL ECHOCARDIOGRAM (TEE) (N/A) APPLICATION OF CELL SAVER CLIPPING OF ATRIAL APPENDAGE USING ATRICLIP PRO 240 Subjective: See other note  Objective: Vital signs in last 24 hours: Temp:  [97.7 F (36.5 C)-99 F (37.2 C)] 97.7 F (36.5 C) (09/17 0645) Pulse Rate:  [88-110] 110 (09/17 0000) Cardiac Rhythm: Atrial fibrillation (09/17 0400) Resp:  [20-30] 30 (09/17 0645) BP: (79-148)/(43-88) 92/51 (09/17 0645) SpO2:  [91 %-100 %] 100 % (09/17 0645) Arterial Line BP: (79-214)/(34-80) 113/45 (09/17 0645) FiO2 (%):  [60 %-100 %] 70 % (09/17 0416) Weight:  [141.2 kg] 141.2 kg (09/17 0500)  Hemodynamic parameters for last 24 hours: PAP: (43-87)/(24-41) 47/27 CVP:  [14 mmHg] 14 mmHg CO:  [12.2 L/min-13.5 L/min] 13.5 L/min CI:  [4.8 L/min/m2-5.3 L/min/m2] 5.3 L/min/m2  Intake/Output from previous day: 09/16 0701 - 09/17 0700 In: 3968.6 [I.V.:1585.2; NG/GT:1583.3; IV Piggyback:800.1] Out: [id number] [Urine:3645] Intake/Output this shift: No intake/output data recorded.  -  Lab Results: Recent Labs    09/14/21 0311 09/14/21 2258 09/15/21 0359  WBC 9.1  --  9.8  HGB 10.5* 10.5* 10.5*  HCT 33.6* 31.0* 32.3*  PLT 120*  --  132*   BMET:  Recent Labs    09/14/21 0311 09/14/21 2258 09/15/21 0359  NA 144 145 143  K 3.9 4.1 4.1  CL 111  --  108  CO2 26  --  25  GLUCOSE 155*  --  180*  BUN 54*  --  60*  CREATININE 1.59*  --  1.63*  CALCIUM 8.9  --  8.9    PT/INR:  Recent Labs    09/15/21 0359  LABPROT 19.0*  INR 1.6*   ABG    Component Value Date/Time   PHART 7.362 09/14/2021 2258   HCO3 25.8 09/14/2021 2258   TCO2 27 09/14/2021 2258   ACIDBASEDEF 1.0 09/12/2021 0322   O2SAT 68.8 09/15/2021 0557   CBG (last 3)  Recent Labs    09/14/21 2338 09/15/21 0359 09/15/21 0828  GLUCAP 188* 154* 155*    Assessment/Plan: S/P Procedure(s)  (LRB): AORTIC VALVE REPLACEMENT (AVR) USING INSPIRIS AORTIC VALVE 27 MM (N/A) MAZE (N/A) TRANSESOPHAGEAL ECHOCARDIOGRAM (TEE) (N/A) APPLICATION OF CELL SAVER CLIPPING OF ATRIAL APPENDAGE USING ATRICLIP PRO [id number]   Duplicate note, see note from 10AM   LOS: 9 days    [name] [name] 09/15/2021

## 2021-09-15 NOTE — Progress Notes (Signed)
PCCM:  Called to bedside  Self extubated Unable to maintain Decision made for re-intubation  Sats in [facility] 09/15/2021 4:30 PM

## 2021-09-15 NOTE — Progress Notes (Signed)
9 Days Post-Op Procedure(s) (LRB): AORTIC VALVE REPLACEMENT (AVR) USING INSPIRIS AORTIC VALVE 27 MM (N/A) MAZE (N/A) TRANSESOPHAGEAL ECHOCARDIOGRAM (TEE) (N/A) APPLICATION OF CELL SAVER CLIPPING OF ATRIAL APPENDAGE USING ATRICLIP PRO 240 Subjective: intubated  Objective: Vital signs in last 24 hours: Temp:  [97.7 F (36.5 C)-99 F (37.2 C)] 97.7 F (36.5 C) (09/17 0645) Pulse Rate:  [88-110] 110 (09/17 0000) Cardiac Rhythm: Atrial fibrillation (09/17 0400) Resp:  [20-30] 30 (09/17 0645) BP: (79-148)/(43-88) 92/51 (09/17 0645) SpO2:  [91 %-100 %] 100 % (09/17 0645) Arterial Line BP: (79-214)/(34-80) 113/45 (09/17 0645) FiO2 (%):  [60 %-100 %] 70 % (09/17 0416) Weight:  [141.2 kg] 141.2 kg (09/17 0500)  Hemodynamic parameters for last 24 hours: PAP: (43-87)/(24-37) 47/27 CVP:  [14 mmHg] 14 mmHg CO:  [12.2 L/min-13.5 L/min] 13.5 L/min CI:  [4.8 L/min/m2-5.3 L/min/m2] 5.3 L/min/m2  Intake/Output from previous day: 09/16 0701 - 09/17 0700 In: 3968.6 [I.V.:1585.2; NG/GT:1583.3; IV Piggyback:800.1] Out: [id number] [Urine:3645] Intake/Output this shift: No intake/output data recorded.  Neurologic: easily arousable Heart: regular rate and rhythm Lungs: rales bilaterally Abdomen: normal findings: soft, non-tender  Lab Results: Recent Labs    09/14/21 0311 09/14/21 2258 09/15/21 0359  WBC 9.1  --  9.8  HGB 10.5* 10.5* 10.5*  HCT 33.6* 31.0* 32.3*  PLT 120*  --  132*   BMET:  Recent Labs    09/14/21 0311 09/14/21 2258 09/15/21 0359  NA 144 145 143  K 3.9 4.1 4.1  CL 111  --  108  CO2 26  --  25  GLUCOSE 155*  --  180*  BUN 54*  --  60*  CREATININE 1.59*  --  1.63*  CALCIUM 8.9  --  8.9    PT/INR:  Recent Labs    09/15/21 0359  LABPROT 19.0*  INR 1.6*   ABG    Component Value Date/Time   PHART 7.362 09/14/2021 2258   HCO3 25.8 09/14/2021 2258   TCO2 27 09/14/2021 2258   ACIDBASEDEF 1.0 09/12/2021 0322   O2SAT 68.8 09/15/2021 0557   CBG (last 3)   Recent Labs    09/14/21 2338 09/15/21 0359 09/15/21 0828  GLUCAP 188* 154* 155*    Assessment/Plan: S/P Procedure(s) (LRB): AORTIC VALVE REPLACEMENT (AVR) USING INSPIRIS AORTIC VALVE 27 MM (N/A) MAZE (N/A) TRANSESOPHAGEAL ECHOCARDIOGRAM (TEE) (N/A) APPLICATION OF CELL SAVER CLIPPING OF ATRIAL APPENDAGE USING ATRICLIP PRO 240 -  NEURo- no focal deficit CV- continues with high output, Ci approx 4 L/min/m2  Adjust drips per Dr. Haroldine Laws RESP- VDRF, pulmonary edema on CXR, diurese  Vent per CCM RENAL- creatinine stable  Agree with plan for lasix drip ENDO- CBG elevated- SSI ID- afebrile, WBC stable on Vanco and Maxipime Gi/ nutrition - tolerating TF  LOS: 9 days    [name] [name] 09/15/2021

## 2021-09-15 NOTE — Procedures (Signed)
Intubation Procedure Note  Langdon Simington  HE:[id number]  1951/10/09  Date:09/15/21  Time:4:31 PM   Provider Performing:Wilberto Console L Kyi Romanello   Procedure: Intubation (31500)  Indication(s) Respiratory Failure  Consent Risks of the procedure as well as the alternatives and risks of each were explained to the patient and/or caregiver.  Consent for the procedure was obtained and is signed in the bedside chart  Anesthesia Etomidate and Rocuronium  Time Out Verified patient identification, verified procedure, site/side was marked, verified correct patient position, special equipment/implants available, medications/allergies/relevant history reviewed, required imaging and test results available.  Sterile Technique Usual hand hygeine, masks, and gloves were used  Procedure Description Patient positioned in bed supine.  Sedation given as noted above.  Patient was intubated with endotracheal tube using Glidescope.  View was Grade 2 only posterior commissure .  Number of attempts was 1.  Colorimetric CO2 detector was consistent with tracheal placement.  Complications/Tolerance None; patient tolerated the procedure well. Chest X-ray is ordered to verify placement.  EBL Minimal  Specimen(s) None  Garner Nash, DO [facility] 09/15/2021 4:31 PM

## 2021-09-16 ENCOUNTER — Inpatient Hospital Stay (HOSPITAL_COMMUNITY): Payer: Medicare Other

## 2021-09-16 DIAGNOSIS — I351 Nonrheumatic aortic (valve) insufficiency: Secondary | ICD-10-CM | POA: Diagnosis not present

## 2021-09-16 DIAGNOSIS — R570 Cardiogenic shock: Secondary | ICD-10-CM | POA: Diagnosis not present

## 2021-09-16 DIAGNOSIS — Z952 Presence of prosthetic heart valve: Secondary | ICD-10-CM | POA: Diagnosis not present

## 2021-09-16 LAB — GLUCOSE, CAPILLARY
Glucose-Capillary: 165 mg/dL — ABNORMAL HIGH (ref 70–99)
Glucose-Capillary: 136 mg/dL — ABNORMAL HIGH (ref 70–99)
Glucose-Capillary: 162 mg/dL — ABNORMAL HIGH (ref 70–99)
Glucose-Capillary: 125 mg/dL — ABNORMAL HIGH (ref 70–99)
Glucose-Capillary: 134 mg/dL — ABNORMAL HIGH (ref 70–99)
Glucose-Capillary: 135 mg/dL — ABNORMAL HIGH (ref 70–99)
Glucose-Capillary: 151 mg/dL — ABNORMAL HIGH (ref 70–99)

## 2021-09-16 LAB — BASIC METABOLIC PANEL
Anion gap: 12 (ref 5–15)
BUN: 66 mg/dL — ABNORMAL HIGH (ref 8–23)
CO2: 26 mmol/L (ref 22–32)
Calcium: 9 mg/dL (ref 8.9–10.3)
Chloride: 107 mmol/L (ref 98–111)
Creatinine, Ser: 1.68 mg/dL — ABNORMAL HIGH (ref 0.61–1.24)
GFR, Estimated: 43 mL/min — ABNORMAL LOW (ref >60)
Glucose, Bld: 162 mg/dL — ABNORMAL HIGH (ref 70–99)
Potassium: 4.1 mmol/L (ref 3.5–5.1)
Sodium: 145 mmol/L (ref 135–145)

## 2021-09-16 LAB — COOXEMETRY PANEL
Carboxyhemoglobin: 0.8 % (ref 0.5–1.5)
Methemoglobin: 1 % (ref 0.0–1.5)
O2 Saturation: 69.1 %
Total hemoglobin: 13.1 g/dL (ref 12.0–16.0)

## 2021-09-16 LAB — PROTIME-INR
INR: 2.1 — ABNORMAL HIGH (ref 0.8–1.2)
Prothrombin Time: 23.1 seconds — ABNORMAL HIGH (ref 11.4–15.2)

## 2021-09-16 LAB — CBC
HCT: 33.2 % — ABNORMAL LOW (ref 39.0–52.0)
Hemoglobin: 10.4 g/dL — ABNORMAL LOW (ref 13.0–17.0)
MCH: 29.8 pg (ref 26.0–34.0)
MCHC: 31.3 g/dL (ref 30.0–36.0)
MCV: 95.1 fL (ref 80.0–100.0)
Platelets: 159 10*3/uL (ref 150–400)
RBC: 3.49 MIL/uL — ABNORMAL LOW (ref 4.22–5.81)
RDW: 16.1 % — ABNORMAL HIGH (ref 11.5–15.5)
WBC: 9.7 10*3/uL (ref 4.0–10.5)
nRBC: 0 % (ref 0.0–0.2)

## 2021-09-16 LAB — MAGNESIUM: Magnesium: 2.1 mg/dL (ref 1.7–2.4)

## 2021-09-16 LAB — PROCALCITONIN: Procalcitonin: 0.33 ng/mL

## 2021-09-16 MED ORDER — WARFARIN SODIUM 2 MG PO TABS
2.0000 mg | ORAL_TABLET | Freq: Once | ORAL | Status: AC
  Administered 2021-09-16: 2 mg
  Filled 2021-09-16: qty 1

## 2021-09-16 MED ORDER — POTASSIUM CHLORIDE 20 MEQ PO PACK
40.0000 meq | PACK | Freq: Once | ORAL | Status: AC
  Administered 2021-09-16: 40 meq
  Filled 2021-09-16: qty 2

## 2021-09-16 MED ORDER — DEXMEDETOMIDINE HCL IN NACL 400 MCG/100ML IV SOLN
0.4000 ug/kg/h | INTRAVENOUS | Status: DC
  Administered 2021-09-16 (×3): 0.5 ug/kg/h via INTRAVENOUS
  Administered 2021-09-17 (×5): 1.2 ug/kg/h via INTRAVENOUS
  Administered 2021-09-17: 0.5 ug/kg/h via INTRAVENOUS
  Administered 2021-09-17 – 2021-09-18 (×6): 1.2 ug/kg/h via INTRAVENOUS
  Administered 2021-09-18: 0.4 ug/kg/h via INTRAVENOUS
  Administered 2021-09-18: 1.2 ug/kg/h via INTRAVENOUS
  Administered 2021-09-19 (×2): 0.5 ug/kg/h via INTRAVENOUS
  Administered 2021-09-19 – 2021-09-22 (×6): 0.4 ug/kg/h via INTRAVENOUS
  Administered 2021-09-22: 0.5 ug/kg/h via INTRAVENOUS
  Filled 2021-09-16 (×6): qty 100
  Filled 2021-09-16: qty 300
  Filled 2021-09-16 (×19): qty 100

## 2021-09-16 MED ORDER — WARFARIN SODIUM 3 MG PO TABS: 3.0000 mg | ORAL_TABLET | Freq: Once | ORAL | Status: DC

## 2021-09-16 MED ORDER — CLONAZEPAM 1 MG PO TABS
1.0000 mg | ORAL_TABLET | Freq: Three times a day (TID) | ORAL | Status: DC
  Administered 2021-09-16 – 2021-09-22 (×19): 1 mg
  Filled 2021-09-16 (×19): qty 1

## 2021-09-16 MED ORDER — MIDODRINE HCL 5 MG PO TABS
10.0000 mg | ORAL_TABLET | Freq: Three times a day (TID) | ORAL | Status: DC
  Administered 2021-09-16 (×3): 10 mg
  Filled 2021-09-16 (×3): qty 2

## 2021-09-16 MED ORDER — ASPIRIN 81 MG PO CHEW
81.0000 mg | CHEWABLE_TABLET | Freq: Every day | ORAL | Status: DC
  Administered 2021-09-16 – 2021-09-22 (×7): 81 mg
  Filled 2021-09-16 (×7): qty 1

## 2021-09-16 NOTE — Progress Notes (Signed)
NAME:  [name] Fellner., MRN:  [id number], DOB:  1951-05-09, LOS: 88 ADMISSION DATE:  09/17/2021, CONSULTATION DATE:  09/07/21 REFERRING MD:  Dr. [name], CHIEF COMPLAINT:  Resp failure   History of Present Illness:  HPI obtained from medical chart review as patient is intubated and sedated on mechanical ventilation.   70 year old male with prior history of prior tobacco abuse (quit 2010), centrilobular emphysema, chronic hypoxic respiratory failure on home O2 (?4L [place]), pulmonary hypertension, OSA on CPAP, PAF, CAD w/ LAD stent 2003, HLD, MSSA sepsis c/b lumbar discitis and infected left sternoclavicular joint 08/2019 who recently had worsening exertional dyspnea, orthopnea, and fatigue.  Had TTE in 04/2021 which showed new moderate to severe AI with normal EF 55-60 and new dilation of LV cavity with trivial MR and mild TR, normal RV.  Subsequently, underwent TEE on 07/10/21 which shoed EF 55-60%, mild dilation of ascending aorta, and severe AI.  Underwent [facility]  07/16/21 which showed patent LAD stent, mild diffuse nonobstructive CAD, moderate pulmonary hypertension with PA 67/20 (39) and a pulmonary wedge pressure of 109mHg.  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  Found to have AV vegetations which where sent for culture.  EBL during surgery 3025 ml with 1870 ml in blood products/ cell saver.  Patient did have some trouble transitioning off CPB with high PA pressures with struggling systemic pressures and RV, therefore ended up on NO and vasopressor support with milrinone.  He remains sedated and intubated with tenuous hemodynamics today.  PCCM consulted to help with vent management.  HF also being consulted to help with pulmonary hypertension.    Of note, patient is followed in our pulmonary office by Dr. SHalford Chessman last seen 08/10/21 by BDerl Barrow NP.  Previous CTA 6/22 neg for PE, noted underlying emphysematous changes with areas  of fibrosis and scarring, nodular opacity in RML (smaller now than previous imaging).  Reported compliance with CPAP and anoro.  PFT 08/09/21- FVC 3.42 (75%), FEV1 2. 60 (77%), ratio 76, DLCOunc 10/29 (38%).  Uses O2 2L at rest, and 3-4L with exertions for goal sats > 88-90%.  Pertinent  Medical History  Former Tobacco abuse (quit 2010), centrilobular emphysema, pulmonary hypertension, OSA on CPAP, PAF, CAD w/ LAD stent 2003, HLD, MSSA sepsis c/b lumbar discitis and infected left sternoclavicular joint 08/2019   Significant Hospital Events: Including procedures, antibiotic start and stop dates in addition to other pertinent events   9/8 aortic valve replacement with bioprosthetic valve and biatrial Maze procedure 9/8 ETT >> 9/8 foley >> 9/8 R IJ cordis with PA cath >> 9/8 L radial Aline >> 9/8 L femoral aline (placed in OR given variable L radial tracings)>> 9/8 parasternal pacing wires >> 9/8 mediastinal CT x 2 >>  9/8 aortic tissue cx >> 9/8 aortic tissue AFB >> 9/8 aortic fungal >> 9/9 Bcx 2 >>  9/8 ancef >>9/9 9/8 vanc >> 9/9 cefepime >> 9/15: L lung white out req bronch  9/17: off nitric, lasix ggt, self extubated   Interim History / Subjective:    Patient remains critically ill intubated on mechanical life support, self extubated yesterday.  Was able to maintain on his own and reintubated.  Objective   Blood pressure 107/64, pulse 85, temperature 98.4 F (36.9 C), resp. rate (!) 30, height _0  (1.854 m), weight (!) 140.3 kg, SpO2 97 %. PAP: (40-87)/(26-47) 41/34 CVP:  [14 mmHg-16 mmHg] 16 mmHg  CO:  [9.2 L/min-11.5 L/min] 11.5 L/min CI:  [3.6 L/min/m2-4.5 L/min/m2] 4.5 L/min/m2  Vent Mode: PRVC FiO2 (%):  [40 %-100 %] 60 % Set Rate:  [30 bmp] 30 bmp Vt Set:  [630 mL] 630 mL PEEP:  [8 [name]20-12 [name]20] 10 [name]20 Plateau Pressure:  [21 [name]20-23 [name]20] 23 [name]20   Intake/Output Summary (Last 24 hours) at 09/16/2021 1025 Last data filed at 09/16/2021 0900 Gross per 24 hour   Intake 3757.59 ml  Output 5345 ml  Net -1587.41 ml   Filed Weights   09/14/21 0500 09/15/21 0500 09/16/21 0435  Weight: (!) 138.3 kg (!) 141.2 kg (!) 140.3 kg    Examination: General appearance: 70 y.o., male, critically ill, intubated on mechanical life support HENT: NCAT, tracking appropriately, endotracheal tube in place Lungs: Bilateral ventilated breath sounds CV: Regular rate rhythm, S1-S2 Abdomen: Soft, nontender nondistended Extremities: Bilateral dependent edema Neuro: Wakes up to stimulation moves all 4 extremities  Resolved Hospital Problem list    Assessment & Plan:   Acute on chronic hypoxic respiratory failure (on home 4L O2 at home) OSA, on home CPAP-- not compliant when sleeping during the day at home.  Remains on high vent settings. 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 He was reintubated. Continue diuresis We switched his sedation to come off of fentanyl slowly today added Precedex. I think he is really close to getting a chance at coming off of the ventilator. Hopefully can wean to extubate within the next 24 to 48 hours. If not or if there is any additional setbacks may have to consider tracheostomy but would like to avoid.  Best Practice (right click and "Reselect all SmartList Selections" daily)   Diet/type: tubefeeds DVT prophylaxis: SCD, defer to TCTS GI prophylaxis: PPI Lines: yes and it is still needed Foley:  Yes, and it is still needed Code Status:  full  code Last date of multidisciplinary goals of care discussion [wife updated at bedside on 9/17]  This patient is critically ill with multiple organ system failure; which, requires frequent high complexity decision making, assessment, support, evaluation, and titration of therapies. This was completed through the application of advanced monitoring technologies and extensive interpretation of multiple databases. During this encounter critical care time was devoted to patient care services described in this note for 32 minutes.  Garner Nash, DO [facility] 09/16/2021 10:25 AM

## 2021-09-16 NOTE — Progress Notes (Signed)
      [address]       [place] [address]             [phone]      Intubated, sedated  BP 126/76   Pulse 77   Temp 98.4 F (36.9 C)   Resp (!) 30   Ht '6\' 1"'$  (1.854 m)   Wt (!) 140.3 kg   SpO2 97%   BMI 40.81 kg/m   Milrinone 0.125, norepi 17   Intake/Output Summary (Last 24 hours) at 09/16/2021 1736 Last data filed at 09/16/2021 0900 Gross per 24 hour  Intake 2970.82 ml  Output 4195 ml  Net -1224.18 ml    Continue current Rx  Stein Windhorst C. Roxan Hockey, MD [facility] [phone]

## 2021-09-16 NOTE — Progress Notes (Signed)
Patient ID: [name] Coaxum., male   DOB: 08/11/1951, 70 y.o.   MRN: [id number]     Advanced Heart Failure Rounding Note  PCP-Cardiologist: [name], MD   Subjective:    9/11: Echo EF 40-45% RV ok. RVSP 22.8  9/14 - 9/15 Developed hypoxia, CXR with left lung white-out, underwent bronchoscopy with treatment of mucus plug. Placed on norepi overnight.  9/16: iNO stopped.  9/17: Self-extubated, had to be reintubated.   Awake on vent, FiO2 0.6.  CXR with pulmonary edema.    Remains on  milrinone 0.25 mcg, norepi 17 mcg, amio 30. In atrial fibrillation rate 100s, INR 2.1 on warfarin.   Vanc/cefepime restarted 9/16 for HCAP, afebrile with PCT 0.33.   Creatinine 1.5 => 1.6 => 1.59 => 1.66. Good UOP, weight down.   Swan #s done personally at bedside CVP 13-14 PA 52/39 (46) PCWP 15 PVR 2.7 WU SVR 500s PAPi 1.0 CO 11.5 CI 4.5  CO-OX 69%    Objective:   Weight Range: (!) 140.3 kg Body mass index is 40.81 kg/m.   Vital Signs:   Temp:  [97.5 F (36.4 C)-99.1 F (37.3 C)] 98.4 F (36.9 C) (09/18 0700) Pulse Rate:  [85-114] 85 (09/18 0400) Resp:  [9-34] 30 (09/18 0700) BP: (80-158)/(44-137) 107/64 (09/18 0645) SpO2:  [83 %-100 %] 97 % (09/18 0700) Arterial Line BP: (87-209)/(39-105) 145/63 (09/18 0700) FiO2 (%):  [40 %-100 %] 60 % (09/18 0409) Weight:  [140.3 kg] 140.3 kg (09/18 0435) Last BM Date: 09/14/21  Weight change: Filed Weights   09/14/21 0500 09/15/21 0500 09/16/21 0435  Weight: (!) 138.3 kg (!) 141.2 kg (!) 140.3 kg    Intake/Output:   Intake/Output Summary (Last 24 hours) at 09/16/2021 0739 Last data filed at 09/16/2021 0658 Gross per 24 hour  Intake 3393.12 ml  Output 5145 ml  Net -1751.88 ml      Physical Exam   CVP 13-14 General: NAD Neck: JVP 10 cm, no thyromegaly or thyroid nodule.  Lungs: Decreased at bases. CV: Nondisplaced PMI.  Heart irregular S1/S2, no S3/S4, no murmur.  Trace ankle edema.  Abdomen: Soft, nontender, no  hepatosplenomegaly, no distention.  Skin: Intact without lesions or rashes.  Neurologic: Awake on vent Extremities: No clubbing or cyanosis.  HEENT: Normal.    Telemetry   Atrial fibrillation 100s (personally reviewed)   EKG    N/A  Labs    CBC Recent Labs    09/14/21 0311 09/14/21 2258 09/15/21 0359 09/16/21 0352  WBC 9.1  --  9.8 9.7  NEUTROABS 6.2  --   --   --   HGB 10.5*   < > 10.5* 10.4*  HCT 33.6*   < > 32.3* 33.2*  MCV 95.5  --  94.4 95.1  PLT 120*  --  132* 159   < > = values in this interval not displayed.   Basic Metabolic Panel Recent Labs    09/15/21 0359 09/16/21 0352  NA 143 145  K 4.1 4.1  CL 108 107  CO2 25 26  GLUCOSE 180* 162*  BUN 60* 66*  CREATININE 1.63* 1.68*  CALCIUM 8.9 9.0  MG 2.1 2.1   Liver Function Tests No results for input(s): AST, ALT, ALKPHOS, BILITOT, PROT, ALBUMIN in the last 72 hours.  No results for input(s): LIPASE, AMYLASE in the last 72 hours. Cardiac Enzymes No results for input(s): CKTOTAL, CKMB, CKMBINDEX, TROPONINI in the last 72 hours.  BNP: BNP (last 3 results) No results for  input(s): BNP in the last 8760 hours.  ProBNP (last 3 results) No results for input(s): PROBNP in the last 8760 hours.   D-Dimer No results for input(s): DDIMER in the last 72 hours. Hemoglobin A1C No results for input(s): HGBA1C in the last 72 hours. Fasting Lipid Panel No results for input(s): CHOL, HDL, LDLCALC, TRIG, CHOLHDL, LDLDIRECT in the last 72 hours. Thyroid Function Tests No results for input(s): TSH, T4TOTAL, T3FREE, THYROIDAB in the last 72 hours.  Invalid input(s): FREET3  Other results:   Imaging    DG CHEST PORT 1 VIEW  Result Date: 09/15/2021 CLINICAL DATA:  Respiratory failure, difficult intubation. Postop day 9 status post aortic valve replacement. EXAM: PORTABLE CHEST 1 VIEW COMPARISON:  09/15/2021 at 6:02 a.m. FINDINGS: The tip of the endotracheal tube is somewhat indistinct. The tube tip is  thought to likely be about 6.3 cm above the carina. Right internal jugular Swan-Ganz catheter tip: Right pulmonary artery. A feeding tube extends into the stomach. Prosthetic aortic valve. Indistinct AP window. Atherosclerotic calcification of the aortic arch. Bilateral interstitial accentuation similar to the earlier radiograph from today. Retrocardiac airspace opacity with obscuration left hemidiaphragm. IMPRESSION: 1. The tip of the endotracheal tube is thought to be about 6.3 cm above the carina. 2. Swan-Ganz catheter has mildly advanced and is currently in the right pulmonary artery. 3. Stable bilateral interstitial accentuation in the lungs favoring interstitial edema. Stable retrocardiac airspace opacity with obscuration of the left hemidiaphragm. 4.  Aortic Atherosclerosis (ICD10-I70.0). Electronically Signed   By: Van Clines M.D.   On: 09/15/2021 17:27     Medications:     Scheduled Medications:  acetylcysteine  2 mL Nebulization TID   arformoterol  15 mcg Nebulization BID   aspirin EC  325 mg Oral Daily   Or   aspirin  324 mg Per Tube Daily   atorvastatin  80 mg Per Tube Daily   bisacodyl  10 mg Oral Daily   Or   bisacodyl  10 mg Rectal Daily   brimonidine  1 drop Both Eyes TID   busPIRone  15 mg Per Tube BID   chlorhexidine gluconate (MEDLINE KIT)  15 mL Mouth Rinse BID   Chlorhexidine Gluconate Cloth  6 each Topical Daily   clonazePAM  1 mg Per Tube BID   docusate  200 mg Per Tube Daily   feeding supplement (PROSource TF)  90 mL Per Tube TID   feeding supplement (VITAL 1.5 CAL)  1,000 mL Per Tube Q24H   fluticasone  2 spray Each Nare Daily   insulin aspart  0-20 Units Subcutaneous Q4H   insulin detemir  30 Units Subcutaneous BID   mouth rinse  15 mL Mouth Rinse 10 times per day   midodrine  5 mg Per Tube TID WC   pantoprazole sodium  40 mg Per Tube Daily   PARoxetine  60 mg Per Tube Daily   polyethylene glycol  17 g Per Tube Daily   potassium chloride  40 mEq Per  Tube Once   revefenacin  175 mcg Nebulization Daily   senna  2 tablet Per Tube QHS   sodium chloride flush  3 mL Intravenous Q12H   warfarin  3 mg Per Tube ONCE-1600   Warfarin - Pharmacist Dosing Inpatient   Does not apply q1600    Infusions:  sodium chloride 10 mL/hr at 09/13/21 0816   sodium chloride     sodium chloride Stopped (09/16/21 0539)   amiodarone 30 mg/hr (09/16/21 0600)  ceFEPime (MAXIPIME) IV 200 mL/hr at 09/16/21 0600   fentaNYL infusion INTRAVENOUS 150 mcg/hr (09/16/21 0625)   furosemide (LASIX) 200 mg in dextrose 5% 100 mL (6m/mL) infusion 12 mg/hr (09/16/21 0013)   lactated ringers 10 mL/hr at 09/16/21 0600   lactated ringers 20 mL/hr at 09/10/21 1517   milrinone 0.25 mcg/kg/min (09/16/21 0600)   norepinephrine (LEVOPHED) Adult infusion 14 mcg/min (09/16/21 0600)   vancomycin Stopped (09/16/21 0112)    PRN Medications: sodium chloride, dextrose, fentaNYL, levalbuterol, midazolam, morphine injection, ondansetron (ZOFRAN) IV, sodium chloride flush    Patient Profile   70y.o. with history of COPD/smoking, chronic severe aortic insufficiency, paroxysmal atrial fibrillation, CAD, type 2 diabetes, and CKD stage 3.  Patient had PCI to LAD in 12/03, LHC in 7/22 showed only mild CAD.  He has paroxysmal atrial fibrillation and had been maintained on Tikosyn.  He had not been ablated due to obesity.  Patient had lumbar discitis and left sternoclavicular joint infection in 9/20, had MSSA.  He had I&D left Mackey joint and IV abx.  Echo at the time showed no vegetation and trivial AI.    HF team consulted for shock/pulmonary hypertension.   Assessment/Plan   1. Aortic insufficiency: TEE pre-op with severe AI, tricuspid aortic valve.  The LV was moderately dilated.  Now s/p bioprosthetic AVR.  The native valve was perforated with possible vegetation.  Suspect he did not have acute endocarditis, tissue cultures remain negative.  Rheumatological serologic workup negative.  -  continue supportive care 2. Shock: Post-op.  Suspect mixed picture with RV dysfunction/severe pulmonary hypertension as well as likely distributive/septic component with low SVR.  Remains afebrile.  On NE 17, midodrine 5 tid, milrinone 0.25.  High cardiac output, low SVR and PVR this morning. MAP improved.  - Decrease milrinone to 0.125.  - Wean NE today.  - Increase midodrine to 10 mg tid.   - Vanc/cefepime restarted 9/16 for HCAP  3.  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  Post-op echo with LV down 40-45% (post-correction of severe AI), normal RV, normal bioprosthetic aortic valve. Post-op volume overload, CVP 13-14.  Good diuresis with Lasix gtt yesterday. Mildly higher BUN/creatinine 1.66.  - Continue Lasix gtt 12 mg/hr today, watch creatinine.  4.  Pulmonary hypertension: Suspect mixed group 2/3 pulmonary hypertension + high output.  Has significant lung parenchymal disease, CTA chest in 6/22 with emphysema as well as areas of fibrosis, as well as OSA and ?OHS.  Pre-op was on CPAP and 4L home oxygen.  Initially, peri-operative PA pressure was very high, in the 90/45 range. Now off iNO. PA pressures stable at 52/39.  PVR normal. Suspect high output contributing.  - Off sildenafil with higher milrinone requirement.  - With low SVR and normal PVR will drop milrinone to 0.125.  May be able to stop tomorrow.  5. Acute on chronic hypoxemic respiratory failure: Intubated post-op.  Baseline COPD + OSA + ?OHS and on home oxygen 4 liters. Now with post-op volume overload as well. 9/15 mucus plug with left lung white-out, S/P Bronch with removal of plug - sputum cx pending. Repeat CXR post-bronch significantly improved.  Self-extubated 9/17, had to be reintubated.  FiO2 down to 0.6.  - Vent wean per CCM.  BGarlon Hatchetand Yupelri ordered  - Continue diuresis.  -  Continue abx.  6. ID: Cultures including  tissue culture from valve so far negative.  9/20 MSSA discitis.  ID following. Concern for possible distributive shock component. Blood cultures from 9/9 with NGTD. WBC normal, afebrile. PCT 0.33 today.  - Continue vancomycin/cefepime.  7. AKI on CKD stage 3: Creatinine fairly stable today at 1.66.  - Follow with diuresis.  - Daily BMET  8. Atrial fibrillation: Paroxysmal.  On Tikosyn pre-op.  Had Maze procedure. In AF currently. Rate controlled.  INR 2.1 on warfarin.  - Continue amio drip.   - Continue warfarin.   CRITICAL CARE Performed by: Loralie Champagne  Total critical care time: 35 minutes  Critical care time was exclusive of separately billable procedures and treating other patients.  Critical care was necessary to treat or prevent imminent or life-threatening deterioration.  Critical care was time spent personally by me (independent of midlevel providers or residents) on the following activities: development of treatment plan with patient and/or surrogate as well as nursing, discussions with consultants, evaluation of patient's response to treatment, examination of patient, obtaining history from patient or surrogate, ordering and performing treatments and interventions, ordering and review of laboratory studies, ordering and review of radiographic studies, pulse oximetry and re-evaluation of patient's condition.  Length of Stay: [place], MD  09/16/2021, 7:39 AM  Advanced Heart Failure Team Pager [phone] (M-F; 7a - 5p)  Please contact [facility] for night-coverage after hours (5p -7a ) and weekends on [email or web address]

## 2021-09-16 NOTE — Progress Notes (Addendum)
[place] for warfarin Indication:  bioprosthetic aortic valve replacement and a fib  Allergies  Allergen Reactions   Pseudoephedrine Other (See Comments)    Patient went into afib   Diltiazem Rash and Itching    Also 2019   Novocain [Procaine] Hives    Dentist appointment in 1958; since then has tolerated lidocaine and provocaine with no hives or difficulty.   Quinolones     Patient was warned about not using Cipro and similar antibiotics. Recent studies have raised concern that fluoroquinolone antibiotics could be associated with an increased risk of aortic aneurysm Fluoroquinolones have non-antimicrobial properties that might jeopardise the integrity of the extracellular matrix of the vascular wall In a  propensity score matched cohort study in [place], there was a 66% increased rate of aortic aneurysm or dissection associated with oral fluoroquinolone use, compared wit   Sulfonamide Derivatives Other (See Comments)    Childhood reaction     Patient Measurements: Height: 6' 1"  (185.4 cm) Weight: (!) 140.3 kg (309 lb 4.9 oz) IBW/kg (Calculated) : 79.9  Vital Signs: Temp: 98.4 F (36.9 C) (09/18 0700) Temp Source: Core (09/18 0400) BP: 107/64 (09/18 0645) Pulse Rate: 85 (09/18 0400)  Labs: Recent Labs    09/14/21 0311 09/14/21 2258 09/15/21 0359 09/16/21 0352  HGB 10.5* 10.5* 10.5* 10.4*  HCT 33.6* 31.0* 32.3* 33.2*  PLT 120*  --  132* 159  LABPROT 16.6*  --  19.0* 23.1*  INR 1.3*  --  1.6* 2.1*  CREATININE 1.59*  --  1.63* 1.68*     Estimated Creatinine Clearance: 59.4 mL/min (A) (by C-G formula based on SCr of 1.68 mg/dL (H)).   Medical History: Past Medical History:  Diagnosis Date   Anxiety    Atrial fibrillation ([place]) 03/22/2009   a. s/p multiple DCCV; b. no coumadin due to low TE risk profile; c. Tikosyn Rx   COPD (chronic obstructive pulmonary disease) (HCC)    Coronary atherosclerosis of native coronary artery  11/2002   a. s/p stent to LAD 12/03; OM2 occluded at cath 12/03; d. myoview 5/10: no ischemia;  e. echo 7/11: EF 55%, BAE, mild RVE, PASP 41-45; Myoview was in March 2013. There was no ischemia or infarction, EF 51%    Cutaneous abscess of back excluding buttocks 07/04/2014   Appears to stem from possibly a cyst very large area 6 cm contact surgeon office    Diabetes mellitus without complication ([place])    Drusen body    see opth note   Dyspnea    Dysrhythmia    Epiglottitis    w emergency nt intubation   ERECTILE DYSFUNCTION 03/22/2009   GERD 03/22/2009   Heart murmur    HYPERGLYCEMIA 04/25/2010   HYPERLIPIDEMIA 03/22/2009   Hypertension    Iliac aneurysm ([place])    2.6 to be evaluated incidental finding on CT   LATERAL EPICONDYLITIS, LEFT 10/24/2009   LIVER FUNCTION TESTS, ABNORMAL 04/25/2010   Local reaction to immunization 05/05/2012   minor resolving  zostavax    Myocardial infarction [facility]) mi2003   Numbness in left leg    foot related to back disease and surgery   Obesity, unspecified 04/24/2009   Perforated appendicitis with necrosis s/p open appendectomy 06/07/14 06/04/2014   Renal cyst    Characterized by MRI as simple   Ruptured suppurative appendicitis    2015    SLEEP APNEA, OBSTRUCTIVE 03/22/2009   compliant with CPAP   THROMBOCYTOPENIA 08/16/2010   TOBACCO USE, QUIT  10/24/2009   ULNAR NEUROPATHY, LEFT 75/91/6384   Umbilical hernia     Medications:  Scheduled:   acetylcysteine  2 mL Nebulization TID   arformoterol  15 mcg Nebulization BID   aspirin EC  325 mg Oral Daily   Or   aspirin  324 mg Per Tube Daily   atorvastatin  80 mg Per Tube Daily   bisacodyl  10 mg Oral Daily   Or   bisacodyl  10 mg Rectal Daily   brimonidine  1 drop Both Eyes TID   busPIRone  15 mg Per Tube BID   chlorhexidine gluconate (MEDLINE KIT)  15 mL Mouth Rinse BID   Chlorhexidine Gluconate Cloth  6 each Topical Daily   clonazePAM  1 mg Per Tube BID   docusate  200 mg Per Tube  Daily   feeding supplement (PROSource TF)  90 mL Per Tube TID   feeding supplement (VITAL 1.5 CAL)  1,000 mL Per Tube Q24H   fluticasone  2 spray Each Nare Daily   insulin aspart  0-20 Units Subcutaneous Q4H   insulin detemir  30 Units Subcutaneous BID   mouth rinse  15 mL Mouth Rinse 10 times per day   midodrine  5 mg Per Tube TID WC   pantoprazole sodium  40 mg Per Tube Daily   PARoxetine  60 mg Per Tube Daily   polyethylene glycol  17 g Per Tube Daily   revefenacin  175 mcg Nebulization Daily   senna  2 tablet Per Tube QHS   sodium chloride flush  3 mL Intravenous Q12H   Warfarin - Pharmacist Dosing Inpatient   Does not apply q1600    Assessment: 71 yo M s/p bioprosthetic AVR, atrial clip, and MAZE procedure on 9/8. Patient has a PMH of a fib on apixaban PTA and has been in a fib s/p MAZE procedure currently on an amio drip.   INR up to 2.1 today, CBC remains stable.  Goal of Therapy:  INR 2-3 Monitor platelets by anticoagulation protocol: Yes   Plan:  Warfarin 48m x1 tonight Daily protime  ADDENDUM: warfarin adjusted to 24mtonight per CVTS.  MiArrie SenatePharmD, BCPS, BCGastro[facility] Consult[facility] Creeklinical Pharmacist [phone] check AMION for all [facility]ange[facility] 09/16/2021

## 2021-09-16 NOTE — Plan of Care (Signed)

## 2021-09-16 NOTE — Progress Notes (Signed)
10 Days Post-Op Procedure(s) (LRB): AORTIC VALVE REPLACEMENT (AVR) USING INSPIRIS AORTIC VALVE 27 MM (N/A) MAZE (N/A) TRANSESOPHAGEAL ECHOCARDIOGRAM (TEE) (N/A) APPLICATION OF CELL SAVER CLIPPING OF ATRIAL APPENDAGE USING ATRICLIP PRO 240 Subjective: Agitated earlier now sedated  Objective: Vital signs in last 24 hours: Temp:  [97.5 F (36.4 C)-99.1 F (37.3 C)] 98.4 F (36.9 C) (09/18 0700) Pulse Rate:  [85-114] 85 (09/18 0400) Cardiac Rhythm: Atrial fibrillation (09/18 0400) Resp:  [9-34] 30 (09/18 0700) BP: (80-158)/(44-137) 107/64 (09/18 0645) SpO2:  [83 %-100 %] 97 % (09/18 0839) Arterial Line BP: (87-209)/(39-105) 145/63 (09/18 0700) FiO2 (%):  [40 %-100 %] 60 % (09/18 0839) Weight:  [140.3 kg] 140.3 kg (09/18 0435)  Hemodynamic parameters for last 24 hours: PAP: (40-87)/(24-47) 41/34 CVP:  [14 mmHg-16 mmHg] 16 mmHg PCWP:  [15 mmHg] 15 mmHg CO:  [9.2 L/min-11.5 L/min] 11.5 L/min CI:  [3.6 L/min/m2-4.5 L/min/m2] 4.5 L/min/m2  Intake/Output from previous day: 09/17 0701 - 09/18 0700 In: 3393.1 [I.V.:1526; NG/GT:1300; IV Piggyback:567.1] Out: 5145 [Urine:5145] Intake/Output this shift: Total I/O In: 214.5 [I.V.:181.6; IV Piggyback:32.8] Out: 350 [Urine:350]  General appearance: intubated, sedated Heart: regular rate and rhythm Lungs: coarse BS bilaterally Abdomen: normal findings: soft, + BS Extremities: edema 2+ Wound: clean and dry  Lab Results: Recent Labs    09/15/21 0359 09/16/21 0352  WBC 9.8 9.7  HGB 10.5* 10.4*  HCT 32.3* 33.2*  PLT 132* 159   BMET:  Recent Labs    09/15/21 0359 09/16/21 0352  NA 143 145  K 4.1 4.1  CL 108 107  CO2 25 26  GLUCOSE 180* 162*  BUN 60* 66*  CREATININE 1.63* 1.68*  CALCIUM 8.9 9.0    PT/INR:  Recent Labs    09/16/21 0352  LABPROT 23.1*  INR 2.1*   ABG    Component Value Date/Time   PHART 7.362 09/14/2021 2258   HCO3 25.8 09/14/2021 2258   TCO2 27 09/14/2021 2258   ACIDBASEDEF 1.0 09/12/2021  0322   O2SAT 69.1 09/16/2021 0352   CBG (last 3)  Recent Labs    09/15/21 2358 09/16/21 0351 09/16/21 0804  GLUCAP 152* 136* 162*    Assessment/Plan: S/P Procedure(s) (LRB): AORTIC VALVE REPLACEMENT (AVR) USING INSPIRIS AORTIC VALVE 27 MM (N/A) MAZE (N/A) TRANSESOPHAGEAL ECHOCARDIOGRAM (TEE) (N/A) APPLICATION OF CELL SAVER CLIPPING OF ATRIAL APPENDAGE USING ATRICLIP PRO 240 Remains critically ill Cv- continues with high output, low SVR physiology, co-ox 69  Milrinone decreased to 0.125  Norepi titrated to BP, midodrine increased  In rate controlled a fib on amiodarone  INR up to 2.1, will decrease warfarin to 2 mg daily RESP- VDRF, still requiring full support  Pulmonary edema on CXR- diuresing RENAL- creatinine stable, diuresing well with Lasix drip  [place] ENDO- CBG mildly elevated Gi- tolerating TF ID- afebrile, normal WBC on Maxipime and Vanco    LOS: 10 days    Melrose Nakayama 09/16/2021

## 2021-09-17 ENCOUNTER — Inpatient Hospital Stay (HOSPITAL_COMMUNITY): Payer: Medicare Other

## 2021-09-17 DIAGNOSIS — I351 Nonrheumatic aortic (valve) insufficiency: Secondary | ICD-10-CM | POA: Diagnosis not present

## 2021-09-17 DIAGNOSIS — J811 Chronic pulmonary edema: Secondary | ICD-10-CM

## 2021-09-17 DIAGNOSIS — J9601 Acute respiratory failure with hypoxia: Secondary | ICD-10-CM | POA: Diagnosis not present

## 2021-09-17 LAB — BASIC METABOLIC PANEL
Anion gap: 11 (ref 5–15)
BUN: 75 mg/dL — ABNORMAL HIGH (ref 8–23)
CO2: 28 mmol/L (ref 22–32)
Calcium: 9.2 mg/dL (ref 8.9–10.3)
Chloride: 105 mmol/L (ref 98–111)
Creatinine, Ser: 1.73 mg/dL — ABNORMAL HIGH (ref 0.61–1.24)
GFR, Estimated: 42 mL/min — ABNORMAL LOW (ref >60)
Glucose, Bld: 183 mg/dL — ABNORMAL HIGH (ref 70–99)
Potassium: 3.8 mmol/L (ref 3.5–5.1)
Sodium: 144 mmol/L (ref 135–145)

## 2021-09-17 LAB — COOXEMETRY PANEL
Carboxyhemoglobin: 1.5 % (ref 0.5–1.5)
Methemoglobin: 0.9 % (ref 0.0–1.5)
O2 Saturation: 70.6 %
Total hemoglobin: 10.5 g/dL — ABNORMAL LOW (ref 12.0–16.0)

## 2021-09-17 LAB — GLUCOSE, CAPILLARY
Glucose-Capillary: 164 mg/dL — ABNORMAL HIGH (ref 70–99)
Glucose-Capillary: 92 mg/dL (ref 70–99)
Glucose-Capillary: 79 mg/dL (ref 70–99)
Glucose-Capillary: 171 mg/dL — ABNORMAL HIGH (ref 70–99)
Glucose-Capillary: 73 mg/dL (ref 70–99)

## 2021-09-17 LAB — CBC
HCT: 36.2 % — ABNORMAL LOW (ref 39.0–52.0)
Hemoglobin: 11.4 g/dL — ABNORMAL LOW (ref 13.0–17.0)
MCH: 29.8 pg (ref 26.0–34.0)
MCHC: 31.5 g/dL (ref 30.0–36.0)
MCV: 94.8 fL (ref 80.0–100.0)
Platelets: 193 10*3/uL (ref 150–400)
RBC: 3.82 MIL/uL — ABNORMAL LOW (ref 4.22–5.81)
RDW: 16.1 % — ABNORMAL HIGH (ref 11.5–15.5)
WBC: 10.3 10*3/uL (ref 4.0–10.5)
nRBC: 0 % (ref 0.0–0.2)

## 2021-09-17 LAB — POCT I-STAT 7, (LYTES, BLD GAS, ICA,H+H)
Acid-Base Excess: 6 mmol/L — ABNORMAL HIGH (ref 0.0–2.0)
Acid-Base Excess: 6 mmol/L — ABNORMAL HIGH (ref 0.0–2.0)
Bicarbonate: 29.7 mmol/L — ABNORMAL HIGH (ref 20.0–28.0)
Bicarbonate: 30.2 mmol/L — ABNORMAL HIGH (ref 20.0–28.0)
Calcium, Ion: 1.25 mmol/L (ref 1.15–1.40)
Calcium, Ion: 1.24 mmol/L (ref 1.15–1.40)
HCT: 32 % — ABNORMAL LOW (ref 39.0–52.0)
HCT: 33 % — ABNORMAL LOW (ref 39.0–52.0)
Hemoglobin: 10.9 g/dL — ABNORMAL LOW (ref 13.0–17.0)
Hemoglobin: 11.2 g/dL — ABNORMAL LOW (ref 13.0–17.0)
O2 Saturation: 92 %
O2 Saturation: 93 %
Patient temperature: 37.5
Potassium: 3.8 mmol/L (ref 3.5–5.1)
Potassium: 4.1 mmol/L (ref 3.5–5.1)
Sodium: 146 mmol/L — ABNORMAL HIGH (ref 135–145)
Sodium: 144 mmol/L (ref 135–145)
TCO2: 31 mmol/L (ref 22–32)
TCO2: 31 mmol/L (ref 22–32)
pCO2 arterial: 40.5 mmHg (ref 32.0–48.0)
pCO2 arterial: 42.5 mmHg (ref 32.0–48.0)
pH, Arterial: 7.473 — ABNORMAL HIGH (ref 7.350–7.450)
pH, Arterial: 7.462 — ABNORMAL HIGH (ref 7.350–7.450)
pO2, Arterial: 60 mmHg — ABNORMAL LOW (ref 83.0–108.0)
pO2, Arterial: 66 mmHg — ABNORMAL LOW (ref 83.0–108.0)

## 2021-09-17 LAB — BODY FLUID CELL COUNT WITH DIFFERENTIAL
Eos, Fluid: 45 %
Lymphs, Fluid: 24 %
Monocyte-Macrophage-Serous Fluid: 8 % — ABNORMAL LOW (ref 50–90)
Neutrophil Count, Fluid: 23 % (ref 0–25)
Total Nucleated Cell Count, Fluid: 3383 cu mm — ABNORMAL HIGH (ref 0–1000)

## 2021-09-17 LAB — LACTATE DEHYDROGENASE, PLEURAL OR PERITONEAL FLUID: LD, Fluid: 709 U/L — ABNORMAL HIGH (ref 3–23)

## 2021-09-17 LAB — MISC LABCORP TEST (SEND OUT): Labcorp test code: 820624

## 2021-09-17 LAB — PROTEIN, PLEURAL OR PERITONEAL FLUID: Total protein, fluid: 3.3 g/dL

## 2021-09-17 LAB — PROTIME-INR
INR: 2 — ABNORMAL HIGH (ref 0.8–1.2)
Prothrombin Time: 22.3 seconds — ABNORMAL HIGH (ref 11.4–15.2)

## 2021-09-17 LAB — MAGNESIUM: Magnesium: 2 mg/dL (ref 1.7–2.4)

## 2021-09-17 MED ORDER — WARFARIN SODIUM 3 MG PO TABS
3.0000 mg | ORAL_TABLET | Freq: Once | ORAL | Status: AC
  Administered 2021-09-17: 3 mg via ORAL
  Filled 2021-09-17: qty 1

## 2021-09-17 MED ORDER — METOLAZONE 2.5 MG PO TABS
2.5000 mg | ORAL_TABLET | Freq: Once | ORAL | Status: AC
  Administered 2021-09-17: 2.5 mg via ORAL
  Filled 2021-09-17: qty 1

## 2021-09-17 MED ORDER — MIDODRINE HCL 5 MG PO TABS
15.0000 mg | ORAL_TABLET | Freq: Three times a day (TID) | ORAL | Status: DC
  Administered 2021-09-17 – 2021-09-19 (×8): 15 mg
  Filled 2021-09-17 (×8): qty 3

## 2021-09-17 NOTE — Progress Notes (Signed)
[place] for warfarin Indication:  bioprosthetic aortic valve replacement and a fib  Allergies  Allergen Reactions   Pseudoephedrine Other (See Comments)    Patient went into afib   Diltiazem Rash and Itching    Also 2019   Novocain [Procaine] Hives    Dentist appointment in 1958; since then has tolerated lidocaine and provocaine with no hives or difficulty.   Quinolones     Patient was warned about not using Cipro and similar antibiotics. Recent studies have raised concern that fluoroquinolone antibiotics could be associated with an increased risk of aortic aneurysm Fluoroquinolones have non-antimicrobial properties that might jeopardise the integrity of the extracellular matrix of the vascular wall In a  propensity score matched cohort study in [place], there was a 66% increased rate of aortic aneurysm or dissection associated with oral fluoroquinolone use, compared wit   Sulfonamide Derivatives Other (See Comments)    Childhood reaction     Patient Measurements: Height: 6' 1"  (185.4 cm) Weight: 135.9 kg (299 lb 9.7 oz) IBW/kg (Calculated) : 79.9  Vital Signs: Temp: 99 F (37.2 C) (09/19 1400) Temp Source: Core (09/19 0900) BP: 139/90 (09/19 1500) Pulse Rate: 57 (09/19 0854)  Labs: Recent Labs    09/15/21 0359 09/16/21 0352 09/17/21 0401 09/17/21 0428 09/17/21 1545  HGB 10.5* 10.4* 11.4* 10.9* 11.2*  HCT 32.3* 33.2* 36.2* 32.0* 33.0*  PLT 132* 159 193  --   --   LABPROT 19.0* 23.1* 22.3*  --   --   INR 1.6* 2.1* 2.0*  --   --   CREATININE 1.63* 1.68* 1.73*  --   --      Estimated Creatinine Clearance: 56.7 mL/min (A) (by C-G formula based on SCr of 1.73 mg/dL (H)).   Medical History: Past Medical History:  Diagnosis Date   Anxiety    Atrial fibrillation ([place]) 03/22/2009   a. s/p multiple DCCV; b. no coumadin due to low TE risk profile; c. Tikosyn Rx   COPD (chronic obstructive pulmonary disease) (HCC)    Coronary  atherosclerosis of native coronary artery 11/2002   a. s/p stent to LAD 12/03; OM2 occluded at cath 12/03; d. myoview 5/10: no ischemia;  e. echo 7/11: EF 55%, BAE, mild RVE, PASP 41-45; Myoview was in March 2013. 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[place])    2.6 to be evaluated incidental finding on CT   LATERAL EPICONDYLITIS, LEFT 10/24/2009   LIVER FUNCTION TESTS, ABNORMAL 04/25/2010   Local reaction to immunization 05/05/2012   minor resolving  zostavax    Myocardial infarction [facility]) mi2003   Numbness in left leg    foot related to back disease and surgery   Obesity, unspecified 04/24/2009   Perforated appendicitis with necrosis s/p open appendectomy 06/07/14 06/04/2014   Renal cyst    Characterized by MRI as simple   Ruptured suppurative appendicitis    2015    SLEEP APNEA, OBSTRUCTIVE  03/22/2009   compliant with CPAP   THROMBOCYTOPENIA 08/16/2010   TOBACCO USE, QUIT 10/24/2009   ULNAR NEUROPATHY, LEFT 00/76/2263   Umbilical hernia     Medications:  Scheduled:   arformoterol  15 mcg Nebulization BID   aspirin  81 mg Per Tube Daily   atorvastatin  80 mg Per Tube Daily   bisacodyl  10 mg Oral Daily   Or   bisacodyl  10 mg Rectal Daily   brimonidine  1 drop Both Eyes TID   busPIRone  15 mg Per Tube BID   chlorhexidine gluconate (MEDLINE KIT)  15 mL Mouth Rinse BID   Chlorhexidine Gluconate Cloth  6 each Topical Daily   clonazePAM  1 mg Per Tube TID   docusate  200 mg Per Tube Daily   feeding supplement (PROSource TF)   90 mL Per Tube TID   feeding supplement (VITAL 1.5 CAL)  1,000 mL Per Tube Q24H   fluticasone  2 spray Each Nare Daily   insulin aspart  0-20 Units Subcutaneous Q4H   insulin detemir  30 Units Subcutaneous BID   mouth rinse  15 mL Mouth Rinse 10 times per day   midodrine  15 mg Per Tube TID WC   pantoprazole sodium  40 mg Per Tube Daily   PARoxetine  60 mg Per Tube Daily   polyethylene glycol  17 g Per Tube Daily   revefenacin  175 mcg Nebulization Daily   senna  2 tablet Per Tube QHS   sodium chloride flush  3 mL Intravenous Q12H   warfarin  3 mg Oral Once   Warfarin - Pharmacist Dosing Inpatient   Does not apply q1600    Assessment: 70 yo M s/p bioprosthetic AVR, atrial clip, and MAZE procedure on 9/8. Patient has a PMH of a fib on apixaban PTA and has been in a fib s/p MAZE procedure currently on an amio drip.   INR therapeutic 2.0  today, CBC remains stable.  Goal of Therapy:  INR 2-3 Monitor platelets by anticoagulation protocol: Yes   Plan:  Warfarin 84m x1 tonight Daily protime    LBonnita NasutiPharm.D. CPP, BCPS Clinical Pharmacist [phone]/19/2022 4:06 PM   [phone] check AMION for all MLyndennumbers 09/17/2021

## 2021-09-17 NOTE — Progress Notes (Signed)
During KUB turned blue, FiO2 back to 100%; will keep pushing diuresis and hold off extubation trial at present; discussed with family/RN/RT.  Erskine Emery MD PCCM

## 2021-09-17 NOTE — Progress Notes (Addendum)
Patient ID: [name] [name]., male   DOB: 1951/03/20, 70 y.o.   MRN: [id number]     Advanced Heart Failure Rounding Note  PCP-Cardiologist: [name], MD   Subjective:    9/11: Echo EF 40-45% RV ok. RVSP 22.8  9/14 - 9/15 Developed hypoxia, CXR with left lung white-out, underwent bronchoscopy with treatment of mucus plug. Placed on norepi overnight.  9/16: iNO stopped.  9/17: Self-extubated, had to be reintubated.   Remains intubated, awake on vent. FiO2 40%. PEEP 5.   Remains on Milrinone 0.125 + NE 9. Co-ox 71%.   On lasix gtt at 12/hr. 4L in UOP yesterday but only net negative 1.3L. Wt trending down. CVP 16 on my read. F/u CXR pending. SCr up slightly,1.5 => 1.6 => 1.59 => 1.66=>1.73  Vanc/cefepime restarted 9/16 for HCAP, afebrile with PCT 0.33.   Swan#s  CVP 16  PA 35/30 (33) PVR 2.7 WU SVR 695 CO 6.10  CI 2.38   CO-OX 71%     Objective:   Weight Range: 135.9 kg Body mass index is 39.53 kg/m.   Vital Signs:   Temp:  [98.1 F (36.7 C)-99.3 F (37.4 C)] 98.6 F (37 C) (09/19 0700) Pulse Rate:  [64-77] 65 (09/19 0400) Resp:  [24-30] 30 (09/19 0700) BP: (86-137)/(51-88) 129/77 (09/19 0700) SpO2:  [91 %-100 %] 93 % (09/19 0700) Arterial Line BP: (85-159)/(41-73) 149/59 (09/19 0700) FiO2 (%):  [40 %-60 %] 40 % (09/19 0400) Weight:  [135.9 kg] 135.9 kg (09/19 0400) Last BM Date: 09/16/21  Weight change: Filed Weights   09/15/21 0500 09/16/21 0435 09/17/21 0400  Weight: (!) 141.2 kg (!) 140.3 kg 135.9 kg    Intake/Output:   Intake/Output Summary (Last 24 hours) at 09/17/2021 0752 Last data filed at 09/17/2021 0700 Gross per 24 hour  Intake 2674.01 ml  Output 4025 ml  Net -1350.99 ml      Physical Exam   CVP 16  General:  intubated and awake on vent, no distress  HEENT: normal + JVD 14 cm. Carotids 2+ bilat; no bruits. No lymphadenopathy or thyromegaly appreciated. Cor: PMI nondisplaced. Irregularly irregular rhythm. No rubs, gallops or  murmurs. Lungs: intubated and clear lear Abdomen: soft, nontender, nondistended. No hepatosplenomegaly. No bruits or masses. Good bowel sounds. Extremities: no cyanosis, clubbing, rash, 2+ bilateral LE edema Neuro: intubated, awake on vent    Telemetry   Atrial fibrillation low 100s (personally reviewed)   EKG    N/A  Labs    CBC Recent Labs    09/16/21 0352 09/17/21 0401  WBC 9.7 10.3  HGB 10.4* 11.4*  HCT 33.2* 36.2*  MCV 95.1 94.8  PLT 159 397   Basic Metabolic Panel Recent Labs    09/16/21 0352 09/17/21 0401  NA 145 144  K 4.1 3.8  CL 107 105  CO2 26 28  GLUCOSE 162* 183*  BUN 66* 75*  CREATININE 1.68* 1.73*  CALCIUM 9.0 9.2  MG 2.1 2.0   Liver Function Tests No results for input(s): AST, ALT, ALKPHOS, BILITOT, PROT, ALBUMIN in the last 72 hours.  No results for input(s): LIPASE, AMYLASE in the last 72 hours. Cardiac Enzymes No results for input(s): CKTOTAL, CKMB, CKMBINDEX, TROPONINI in the last 72 hours.  BNP: BNP (last 3 results) No results for input(s): BNP in the last 8760 hours.  ProBNP (last 3 results) No results for input(s): PROBNP in the last 8760 hours.   D-Dimer No results for input(s): DDIMER in the last 72 hours.  Hemoglobin A1C No results for input(s): HGBA1C in the last 72 hours. Fasting Lipid Panel No results for input(s): CHOL, HDL, LDLCALC, TRIG, CHOLHDL, LDLDIRECT in the last 72 hours. Thyroid Function Tests No results for input(s): TSH, T4TOTAL, T3FREE, THYROIDAB in the last 72 hours.  Invalid input(s): FREET3  Other results:   Imaging    DG CHEST PORT 1 VIEW  Result Date: 09/16/2021 CLINICAL DATA:  Central line placement. EXAM: PORTABLE CHEST 1 VIEW COMPARISON:  09/16/2021 FINDINGS: An endotracheal tube with tip with tip 6.3 cm above the carina, small bore feeding tube entering the stomach with tip off the field of view and RIGHT IJ Swan-Ganz catheter with tip overlying the main pulmonary artery again noted.  Cardiomegaly, cardiac valve replacement with bilateral interstitial/airspace opacities/edema again noted as well as LEFT LOWER lung consolidation/atelectasis. No pneumothorax. IMPRESSION: 1. Little interval change with bilateral interstitial/airspace opacities/edema and LEFT LOWER lung consolidation/atelectasis again noted. 2. Support apparatus as described. Electronically Signed   By: Margarette Canada M.D.   On: 09/16/2021 18:14     Medications:     Scheduled Medications:  arformoterol  15 mcg Nebulization BID   aspirin  81 mg Per Tube Daily   atorvastatin  80 mg Per Tube Daily   bisacodyl  10 mg Oral Daily   Or   bisacodyl  10 mg Rectal Daily   brimonidine  1 drop Both Eyes TID   busPIRone  15 mg Per Tube BID   chlorhexidine gluconate (MEDLINE KIT)  15 mL Mouth Rinse BID   Chlorhexidine Gluconate Cloth  6 each Topical Daily   clonazePAM  1 mg Per Tube TID   docusate  200 mg Per Tube Daily   feeding supplement (PROSource TF)  90 mL Per Tube TID   feeding supplement (VITAL 1.5 CAL)  1,000 mL Per Tube Q24H   fluticasone  2 spray Each Nare Daily   insulin aspart  0-20 Units Subcutaneous Q4H   insulin detemir  30 Units Subcutaneous BID   mouth rinse  15 mL Mouth Rinse 10 times per day   midodrine  10 mg Per Tube TID WC   pantoprazole sodium  40 mg Per Tube Daily   PARoxetine  60 mg Per Tube Daily   polyethylene glycol  17 g Per Tube Daily   revefenacin  175 mcg Nebulization Daily   senna  2 tablet Per Tube QHS   sodium chloride flush  3 mL Intravenous Q12H   Warfarin - Pharmacist Dosing Inpatient   Does not apply q1600    Infusions:  sodium chloride 10 mL/hr at 09/13/21 0816   sodium chloride     sodium chloride 10 mL/hr at 09/17/21 0700   amiodarone 30 mg/hr (09/17/21 0700)   ceFEPime (MAXIPIME) IV Stopped (09/17/21 0539)   dexmedetomidine (PRECEDEX) IV infusion 1.2 mcg/kg/hr (09/17/21 0743)   fentaNYL infusion INTRAVENOUS 175 mcg/hr (09/17/21 0700)   furosemide (LASIX) 200 mg in  dextrose 5% 100 mL (61m/mL) infusion 12 mg/hr (09/17/21 0024)   lactated ringers 10 mL/hr at 09/17/21 0700   lactated ringers 20 mL/hr at 09/10/21 1517   milrinone 0.125 mcg/kg/min (09/17/21 0700)   norepinephrine (LEVOPHED) Adult infusion 9 mcg/min (09/17/21 0700)   vancomycin Stopped (09/17/21 0035)    PRN Medications: sodium chloride, dextrose, fentaNYL, levalbuterol, midazolam, morphine injection, ondansetron (ZOFRAN) IV, sodium chloride flush    Patient Profile   70y.o. with history of COPD/smoking, chronic severe aortic insufficiency, paroxysmal atrial fibrillation, CAD, type 2 diabetes, and CKD  stage 3.  Patient had PCI to LAD in 12/03, LHC in 7/22 showed only mild CAD.  He has paroxysmal atrial fibrillation and had been maintained on Tikosyn.  He had not been ablated due to obesity.  Patient had lumbar discitis and left sternoclavicular joint infection in 9/20, had MSSA.  He had I&D left Viola joint and IV abx.  Echo at the time showed no vegetation and trivial AI.    HF team consulted for shock/pulmonary hypertension.   Assessment/Plan   1. Aortic insufficiency: TEE pre-op with severe AI, tricuspid aortic valve.  The LV was moderately dilated.  Now s/p bioprosthetic AVR.  The native valve was perforated with possible vegetation.  Suspect he did not have acute endocarditis, tissue cultures remain negative.  Rheumatological serologic workup negative.  - continue supportive care 2. Shock: Post-op.  Suspect mixed picture with RV dysfunction/severe pulmonary hypertension as well as likely distributive/septic component with low SVR.  Remains afebrile.  On NE 9, midodrine 5 tid, milrinone 0.125.  Co-ox 71%. SVR improving but remains low. MAP improved.  - ? Stop milrinone today  - Wean NE as tolerated  - Continue midodrine 10 mg tid.   - Vanc/cefepime restarted 9/16 for HCAP  3.  Acute on chronic diastolic CHF with RV failure:  TEE (7/22) with EF 55-60%, moderate LV enlargement, mild RV  enlargement with normal function, severe AI with tricuspid aortic valve (pre-op).  Post-op echo with LV down 40-45% (post-correction of severe AI), normal RV, normal bioprosthetic aortic valve. Post-op volume overload, CVP 16.  Good diuresis with Lasix gtt yesterday. Mildly higher BUN/creatinine 1.73.  - Continue Lasix gtt 12 mg/hr today, watch creatinine.  - Consider 2.5 of metolazone to augment diuresis  4.  Pulmonary hypertension: Suspect mixed group 2/3 pulmonary hypertension + high output.  Has significant lung parenchymal disease, CTA chest in 6/22 with emphysema as well as areas of fibrosis, as well as OSA and ?OHS.  Pre-op was on CPAP and 4L home oxygen.  Initially, peri-operative PA pressure was very high, in the 90/45 range. Now off iNO. PA pressures stable at 35/30.  PVR normal. Suspect high output contributing.  - Off sildenafil with higher milrinone requirement.  - With low SVR and normal PVR, stop milrinone today  5. Acute on chronic hypoxemic respiratory failure: Intubated post-op.  Baseline COPD + OSA + ?OHS and on home oxygen 4 liters. Now with post-op volume overload as well. 9/15 mucus plug with left lung white-out, S/P Bronch with removal of plug - sputum cx pending. Repeat CXR post-bronch significantly improved.  Self-extubated 9/17, had to be reintubated.   - Vent wean per CCM.  Garlon Hatchet and Yupelri ordered  - Continue diuresis.  - Continue abx.  6. ID: Cultures including tissue culture from valve so far negative.  9/20 MSSA discitis.  ID following. Concern for possible distributive shock component. Blood cultures from 9/9 with NGTD. WBC normal, afebrile. PCT 0.33.  - Continue vancomycin/cefepime.  7. AKI on CKD stage 3: Creatinine fairly stable today at 1.73.  - Follow with diuresis.  - Daily BMET  8. Atrial fibrillation: Paroxysmal.  On Tikosyn pre-op.  Had Maze procedure. In AF currently. Rate controlled.  INR 2.0 on warfarin.  - Continue amio drip.   - Continue warfarin.    Length of Stay: [address], PA-C  09/17/2021, 7:52 AM  Advanced Heart Failure Team Pager [phone] (M-F; 7a - 5p)  Please contact [facility] for night-coverage after hours (5p -7a )  and weekends on [email or web address]  Patient seen with PA, agree with the above note.   He remains on NE 9 + milrinone 0.125.  MAP stable, CI 2.3, co-ox 71%.  CVP 16.  PA systolic pressure around 40.  CXR still with pulmonary edema.  Patient is on FiO2 0.4, will follow commands.   General: Intubated/sedated.  Neck: JVP 14 cm, no thyromegaly or thyroid nodule.  Lungs: Crackles bilaterally. CV: Nondisplaced PMI.  Heart irregular S1/S2, no S3/S4, 2/6 early SEM RUSB.  Trace ankle edema.  Abdomen: Soft, nontender, no hepatosplenomegaly, no distention.  Skin: Intact without lesions or rashes.  Neurologic: Follows commands on vent.  Extremities: No clubbing or cyanosis.  HEENT: Normal.   BP improved this morning, will wean down norepinephrine.  With low SVR/septic shock component and low PVR, will stop milrinone today.  Increase midodrine to 15 mg tid.   Still with volume overload, CVP 16.  Pulmonary edema on CXR.  Creatinine up mildly to 1.73 but will push diuresis again today with Lasix gtt 12 mg/hr + metolazone 2.5 x 1.  Replace K.    HR slower but probably still in atrial fibrillation.  Continue warfarin + amiodarone gtt.  Will get ECG today.   FiO2 down to 0.4, may be ready to extubate later today with a bit more diuresis.   CRITICAL CARE Performed by: Loralie Champagne  Total critical care time: 35 minutes  Critical care time was exclusive of separately billable procedures and treating other patients.  Critical care was necessary to treat or prevent imminent or life-threatening deterioration.  Critical care was time spent personally by me on the following activities: development of treatment plan with patient and/or surrogate as well as nursing, discussions with consultants, evaluation of  patient's response to treatment, examination of patient, obtaining history from patient or surrogate, ordering and performing treatments and interventions, ordering and review of laboratory studies, ordering and review of radiographic studies, pulse oximetry and re-evaluation of patient's condition.  Loralie Champagne 09/17/2021 8:39 AM

## 2021-09-17 NOTE — Procedures (Signed)
Thoracentesis  Procedure Note  [name] [name]  HE:[id number]  December 10, 1951  Date:09/17/21  Time:9:42 AM   Provider Performing:Arva Slaugh C Tamala Julian   Procedure: Thoracentesis with imaging guidance [id number])  Indication(s) Pleural Effusion  Consent Risks of the procedure as well as the alternatives and risks of each were explained to the patient and/or caregiver.  Consent for the procedure was obtained and is signed in the bedside chart  Anesthesia Topical only with 1% lidocaine    Time Out Verified patient identification, verified procedure, site/side was marked, verified correct patient position, special equipment/implants available, medications/allergies/relevant history reviewed, required imaging and test results available.   Sterile Technique Maximal sterile technique including full sterile barrier drape, hand hygiene, sterile gown, sterile gloves, mask, hair covering, sterile ultrasound probe cover (if used).  Procedure Description Ultrasound was used to identify appropriate pleural anatomy for placement and overlying skin marked.  Area of drainage cleaned and draped in sterile fashion. Lidocaine was used to anesthetize the skin and subcutaneous tissue.  1000 cc's of bloody appearing fluid was drained from the left pleural space. Catheter then removed and bandaid applied to site.   Complications/Tolerance None; patient tolerated the procedure well. Chest X-ray is ordered to confirm no post-procedural complication.   EBL Minimal   Specimen(s) Pleural fluid

## 2021-09-17 NOTE — Progress Notes (Signed)
NAME:  [name] Rathbone., MRN:  [id number], DOB:  Dec 05, 1951, LOS: 28 ADMISSION DATE:  09/14/2021, CONSULTATION DATE:  09/07/21 REFERRING MD:  Dr. [name], CHIEF COMPLAINT:  Resp failure   History of Present Illness:  HPI obtained from medical chart review as patient is intubated and sedated on mechanical ventilation.   70 year old male with prior history of prior tobacco abuse (quit 2010), centrilobular emphysema, chronic hypoxic respiratory failure on home O2 (?4L [place]), pulmonary hypertension, OSA on CPAP, PAF, CAD w/ LAD stent 2003, HLD, MSSA sepsis c/b lumbar discitis and infected left sternoclavicular joint 08/2019 who recently had worsening exertional dyspnea, orthopnea, and fatigue.  Had TTE in 04/2021 which showed new moderate to severe AI with normal EF 55-60 and new dilation of LV cavity with trivial MR and mild TR, normal RV.  Subsequently, underwent TEE on 07/10/21 which shoed EF 55-60%, mild dilation of ascending aorta, and severe AI.  Underwent [facility]  07/16/21 which showed patent LAD stent, mild diffuse nonobstructive CAD, moderate pulmonary hypertension with PA 67/20 (39) and a pulmonary wedge pressure of 85mHg.  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  Found to have AV vegetations which where sent for culture.  EBL during surgery 3025 ml with 1870 ml in blood products/ cell saver.  Patient did have some trouble transitioning off CPB with high PA pressures with struggling systemic pressures and RV, therefore ended up on NO and vasopressor support with milrinone.  He remains sedated and intubated with tenuous hemodynamics today.  PCCM consulted to help with vent management.  HF also being consulted to help with pulmonary hypertension.    Of note, patient is followed in our pulmonary office by Dr. SHalford Chessman last seen 08/10/21 by BDerl Barrow NP.  Previous CTA 6/22 neg for PE, noted underlying emphysematous changes with areas  of fibrosis and scarring, nodular opacity in RML (smaller now than previous imaging).  Reported compliance with CPAP and anoro.  PFT 08/09/21- FVC 3.42 (75%), FEV1 2. 60 (77%), ratio 76, DLCOunc 10/29 (38%).  Uses O2 2L at rest, and 3-4L with exertions for goal sats > 88-90%.  Pertinent  Medical History  Former Tobacco abuse (quit 2010), centrilobular emphysema, pulmonary hypertension, OSA on CPAP, PAF, CAD w/ LAD stent 2003, HLD, MSSA sepsis c/b lumbar discitis and infected left sternoclavicular joint 08/2019   Significant Hospital Events: Including procedures, antibiotic start and stop dates in addition to other pertinent events   9/8 aortic valve replacement with bioprosthetic valve and biatrial Maze procedure 9/8 ETT >> 9/8 foley >> 9/8 R IJ cordis with PA cath >> 9/8 L radial Aline >> 9/8 L femoral aline (placed in OR given variable L radial tracings)>> 9/8 parasternal pacing wires >> 9/8 mediastinal CT x 2 >>  9/8 aortic tissue cx >> 9/8 aortic tissue AFB >> 9/8 aortic fungal >> 9/9 Bcx 2 >>  9/8 ancef >>9/9 9/8 vanc >> 9/9 cefepime >> 9/15: L lung white out req bronch  9/17: off nitric, lasix ggt, self extubated   Interim History / Subjective:  Still on fair bit of PEEP. Diuresed but Cr creeping up.  Objective   Blood pressure 129/77, pulse 65, temperature 98.6 F (37 C), resp. rate (!) 30, height _0  (1.854 m), weight 135.9 kg, SpO2 93 %. PAP: (31-50)/(25-42) 31/26 CVP:  [13 mmHg-14 mmHg] 13 mmHg CO:  [6 L/min-6.1 L/min] 6 L/min CI:  [2.3 L/min/m2-2.4 L/min/m2] 2.3 L/min/m2  Vent Mode: PRVC FiO2 (%):  [40 %-60 %] 40 % Set Rate:  [30 bmp] 30 bmp Vt Set:  [630 mL] 630 mL PEEP:  [10 [name]20] 10 [name]20 Plateau Pressure:  [23 [name]20-25 [name]20] 25 [name]20   Intake/Output Summary (Last 24 hours) at 09/17/2021 0801 Last data filed at 09/17/2021 0700 Gross per 24 hour  Intake 2459.54 ml  Output 3675 ml  Net -1215.46 ml    Filed Weights   09/15/21 0500 09/16/21 0435  09/17/21 0400  Weight: (!) 141.2 kg (!) 140.3 kg 135.9 kg    Examination: No distress on vent Breath sounds diminished at bases, moderate L effusion, trace R with [place] Ext warm Abdomen soft, hypoactive BS Moves all 4 ext with sedation wean  Coox 70 BUN/Cr creeping up CBC stable, improved plts CXR still wet, bilaeral effusions Resolved Hospital Problem list    Assessment & Plan:   Acute on chronic hypoxic respiratory failure (on home 4L O2 at home) OSA, on home CPAP-- not compliant when sleeping during the day at home.  Remains on high vent settings. 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 Question HCAP, BAL w/ normal flora; secretion burden improved with abx Decompensated RV failure with shock, PVR preserved on cath in July AI s/p AVR and MAZE procedure Hx PAF, CAD with LAD stent AKI on CKD- a little worse with diuresis but tolerating Thrombocytopenia- improving Ileus vs constipation- +BM 2 days ago Class 3 obesity DM2  P: Drop lasix to 47m/hr, may need holiday tomorrow  Trial of extubation to BIPAP  Will drain L pleural space hopefully when he is extubated; if fails extubation will do after reintubation  5 days HCAP coverage given negative cultures  Basal bolus insulin as ordered  Recheck KUB  Unfortunately may end up with trach for vent wean but will see how extubation trial goes; will need warfarin washout regardless  Best Practice (right click and "Reselect all SmartList Selections" daily)   Diet/type: tubefeeds DVT prophylaxis: warfarin GI prophylaxis: PPI Lines: yes and it is still needed Foley:  Yes, and it is still needed Code Status:  full code Last date of multidisciplinary goals of care discussion [wife updated at bedside on 9/19]  Patient critically ill due to respiratory failure Interventions to address this today vent titration Risk of deterioration without these interventions is high  I  personally spent 44 minutes providing critical care not including any separately billable procedures  DErskine EmeryMD [facility]  Prefer epic messenger for cross cover needs If after hours, please call E-link

## 2021-09-17 NOTE — Plan of Care (Signed)

## 2021-09-17 NOTE — Progress Notes (Addendum)
[address]       [place] [address]             [phone]      11 Days Post-Op  Procedure(s) (LRB): AORTIC VALVE REPLACEMENT (AVR) USING INSPIRIS AORTIC VALVE 27 MM (N/A) MAZE (N/A) TRANSESOPHAGEAL ECHOCARDIOGRAM (TEE) (N/A) APPLICATION OF CELL SAVER CLIPPING OF ATRIAL APPENDAGE USING ATRICLIP PRO 240   Total Length of Stay:  LOS: 11 days    SUBJECTIVE:  Vitals:   09/17/21 1600 09/17/21 1615  BP: 124/76   Pulse:    Resp: (!) 30 (!) 30  Temp: 99.5 F (37.5 C) 99.5 F (37.5 C)  SpO2: 96% 97%    Intake/Output      09/18 0701 09/19 0700 09/19 0701 09/20 0700   I.V. (mL/kg) 1991.3 (14.7) 859.1 (6.3)   NG/GT 200 450   IV Piggyback 532.7 100   Total Intake(mL/kg) 2724 (20) 1409.1 (10.4)   Urine (mL/kg/hr) 4025 (1.2) 1885 (1.4)   Other  1000   Total Output 4025 2885   Net -1301 -1475.9            sodium chloride 10 mL/hr at 09/13/21 0816   sodium chloride     sodium chloride 10 mL/hr at 09/17/21 1600   amiodarone 30 mg/hr (09/17/21 1600)   ceFEPime (MAXIPIME) IV Stopped (09/17/21 1530)   dexmedetomidine (PRECEDEX) IV infusion 1.2 mcg/kg/hr (09/17/21 1600)   fentaNYL infusion INTRAVENOUS 175 mcg/hr (09/17/21 1600)   furosemide (LASIX) 200 mg in dextrose 5% 100 mL ('2mg'$ /mL) infusion 12 mg/hr (09/17/21 0925)   lactated ringers 10 mL/hr at 09/17/21 1600   lactated ringers 20 mL/hr at 09/10/21 1517   norepinephrine (LEVOPHED) Adult infusion 4 mcg/min (09/17/21 1600)   vancomycin Stopped (09/17/21 0035)    CBC    Component Value Date/Time   WBC 10.3 09/17/2021 0401   RBC 3.82 (L) 09/17/2021 0401   HGB 11.2 (L) 09/17/2021 1545   HGB 13.7 07/12/2021 0823   HGB 16.3 03/12/2011 0948   HCT 33.0 (L) 09/17/2021 1545   HCT 42.5 07/12/2021 0823   HCT 46.6 03/12/2011 0948   PLT 193 09/17/2021 0401   PLT 179 07/12/2021 0823   MCV 94.8 09/17/2021 0401   MCV 90 07/12/2021 0823   MCV 90.9 03/12/2011 0948   MCH 29.8 09/17/2021 0401   MCHC  31.5 09/17/2021 0401   RDW 16.1 (H) 09/17/2021 0401   RDW 13.1 07/12/2021 0823   RDW 13.1 03/12/2011 0948   LYMPHSABS 1.1 09/14/2021 0311   LYMPHSABS 3.4 (H) 12/25/2017 0937   LYMPHSABS 2.7 03/12/2011 0948   MONOABS 1.1 (H) 09/14/2021 0311   MONOABS 0.6 03/12/2011 0948   EOSABS 0.7 (H) 09/14/2021 0311   EOSABS 0.6 (H) 12/25/2017 0937   BASOSABS 0.0 09/14/2021 0311   BASOSABS 0.0 12/25/2017 0937   BASOSABS 0.0 03/12/2011 0948   CMP     Component Value Date/Time   NA 144 09/17/2021 1545   NA 138 07/19/2021 1407   K 4.1 09/17/2021 1545   CL 105 09/17/2021 0401   CO2 28 09/17/2021 0401   GLUCOSE 183 (H) 09/17/2021 0401   BUN 75 (H) 09/17/2021 0401   BUN 30 (H) 07/19/2021 1407   CREATININE 1.73 (H) 09/17/2021 0401   CREATININE 1.21 (H) 09/14/2020 0754   CALCIUM 9.2 09/17/2021 0401   PROT 4.9 (L) 09/08/2021 0346   PROT 7.0 04/30/2021 1013   ALBUMIN 3.0 (L) 09/08/2021 0346   ALBUMIN 4.5 04/30/2021 1013  AST 61 (H) 09/08/2021 0346   ALT 14 09/08/2021 0346   ALKPHOS 44 09/08/2021 0346   BILITOT 1.0 09/08/2021 0346   BILITOT 1.0 04/30/2021 1013   GFRNONAA 42 (L) 09/17/2021 0401   GFRNONAA 60 09/14/2020 0754   GFRAA 64 12/04/2020 1444   GFRAA 70 09/14/2020 0754   ABG    Component Value Date/Time   PHART 7.462 (H) 09/17/2021 1545   PCO2ART 42.5 09/17/2021 1545   PO2ART 66 (L) 09/17/2021 1545   HCO3 30.2 (H) 09/17/2021 1545   TCO2 31 09/17/2021 1545   ACIDBASEDEF 1.0 09/12/2021 0322   O2SAT 93.0 09/17/2021 1545   CBG (last 3)  Recent Labs    09/17/21 0936 09/17/21 1255 09/17/21 1538  GLUCAP 92 79 171*     ASSESSMENT: Conts aggressive critical care management Had thoracentesis today, left 1000 c , bloody Pushing diuresis and holding on weaning vent for now John Giovanni, PA-C   Chart reviewed, patient examined, agree with above. Had left thoracentesis today and removed 1L. CXR looks a little better.  Continues to diurese well on lasix drip.  Remains on vent  50% FiO2, 8 PEEP with sats 97%

## 2021-09-17 NOTE — Procedures (Signed)
Central Venous Catheter Insertion Procedure Note  Jarule Sampath  XN:[id number]  27-May-1951  Date:09/17/21  Time:1:37 PM   Provider Performing:Jahzier Villalon R Esiah Bazinet   Procedure: Insertion of Non-tunneled Central Venous [id number]) with US guidance [id number])   Indication(s) Medication administration and Difficult access  Consent Risks of the procedure as well as the alternatives and risks of each were explained to the patient and/or caregiver.  Consent for the procedure was obtained and is signed in the bedside chart  Anesthesia Topical only with 1% lidocaine   Timeout Verified patient identification, verified procedure, site/side was marked, verified correct patient position, special equipment/implants available, medications/allergies/relevant history reviewed, required imaging and test results available.  Sterile Technique Maximal sterile technique including full sterile barrier drape, hand hygiene, sterile gown, sterile gloves, mask, hair covering, sterile ultrasound probe cover (if used).  Procedure Description Area of catheter insertion was cleaned with chlorhexidine and draped in sterile fashion.  With real-time ultrasound guidance a central venous catheter was placed into the left internal jugular vein. Nonpulsatile blood flow and easy flushing noted in all ports.  The catheter was sutured in place and sterile dressing applied.  Complications/Tolerance None; patient tolerated the procedure well. Chest X-ray is ordered to verify placement for internal jugular or subclavian cannulation.   Chest x-ray is not ordered for femoral cannulation.  EBL Minimal  Specimen(s) None       Otilio Carpen Stefana Lodico, PA-C

## 2021-09-18 DIAGNOSIS — J9601 Acute respiratory failure with hypoxia: Secondary | ICD-10-CM | POA: Diagnosis not present

## 2021-09-18 LAB — BASIC METABOLIC PANEL
Anion gap: 13 (ref 5–15)
BUN: 85 mg/dL — ABNORMAL HIGH (ref 8–23)
CO2: 29 mmol/L (ref 22–32)
Calcium: 9.5 mg/dL (ref 8.9–10.3)
Chloride: 100 mmol/L (ref 98–111)
Creatinine, Ser: 1.89 mg/dL — ABNORMAL HIGH (ref 0.61–1.24)
GFR, Estimated: 37 mL/min — ABNORMAL LOW (ref >60)
Glucose, Bld: 245 mg/dL — ABNORMAL HIGH (ref 70–99)
Potassium: 3.8 mmol/L (ref 3.5–5.1)
Sodium: 142 mmol/L (ref 135–145)

## 2021-09-18 LAB — PATHOLOGIST SMEAR REVIEW

## 2021-09-18 LAB — GLUCOSE, CAPILLARY
Glucose-Capillary: 189 mg/dL — ABNORMAL HIGH (ref 70–99)
Glucose-Capillary: 197 mg/dL — ABNORMAL HIGH (ref 70–99)
Glucose-Capillary: 202 mg/dL — ABNORMAL HIGH (ref 70–99)
Glucose-Capillary: 219 mg/dL — ABNORMAL HIGH (ref 70–99)
Glucose-Capillary: 222 mg/dL — ABNORMAL HIGH (ref 70–99)
Glucose-Capillary: 222 mg/dL — ABNORMAL HIGH (ref 70–99)
Glucose-Capillary: 256 mg/dL — ABNORMAL HIGH (ref 70–99)

## 2021-09-18 LAB — MAGNESIUM: Magnesium: 2.1 mg/dL (ref 1.7–2.4)

## 2021-09-18 LAB — CBC
HCT: 36.6 % — ABNORMAL LOW (ref 39.0–52.0)
Hemoglobin: 11.5 g/dL — ABNORMAL LOW (ref 13.0–17.0)
MCH: 29.4 pg (ref 26.0–34.0)
MCHC: 31.4 g/dL (ref 30.0–36.0)
MCV: 93.6 fL (ref 80.0–100.0)
Platelets: 203 10*3/uL (ref 150–400)
RBC: 3.91 MIL/uL — ABNORMAL LOW (ref 4.22–5.81)
RDW: 15.8 % — ABNORMAL HIGH (ref 11.5–15.5)
WBC: 10.4 10*3/uL (ref 4.0–10.5)
nRBC: 0 % (ref 0.0–0.2)

## 2021-09-18 LAB — PROTIME-INR
INR: 1.8 — ABNORMAL HIGH (ref 0.8–1.2)
Prothrombin Time: 20.9 seconds — ABNORMAL HIGH (ref 11.4–15.2)

## 2021-09-18 LAB — COOXEMETRY PANEL
Carboxyhemoglobin: 1.4 % (ref 0.5–1.5)
Methemoglobin: 0.9 % (ref 0.0–1.5)
O2 Saturation: 70.1 %
Total hemoglobin: 9.8 g/dL — ABNORMAL LOW (ref 12.0–16.0)

## 2021-09-18 MED ORDER — ORAL CARE MOUTH RINSE
15.0000 mL | Freq: Two times a day (BID) | OROMUCOSAL | Status: DC
  Administered 2021-09-18 – 2021-09-22 (×8): 15 mL via OROMUCOSAL

## 2021-09-18 MED ORDER — SODIUM CHLORIDE 0.9 % IV SOLN
2.0000 g | Freq: Once | INTRAVENOUS | Status: AC
  Administered 2021-09-18: 2 g via INTRAVENOUS
  Filled 2021-09-18: qty 2

## 2021-09-18 MED ORDER — METHYLNALTREXONE BROMIDE 12 MG/0.6ML ~~LOC~~ SOLN
12.0000 mg | Freq: Once | SUBCUTANEOUS | Status: AC
  Administered 2021-09-18: 12 mg via SUBCUTANEOUS
  Filled 2021-09-18: qty 0.6

## 2021-09-18 MED ORDER — POTASSIUM CHLORIDE 20 MEQ PO PACK
40.0000 meq | PACK | Freq: Once | ORAL | Status: AC
  Administered 2021-09-18: 40 meq via ORAL
  Filled 2021-09-18: qty 2

## 2021-09-18 MED ORDER — AMIODARONE HCL 200 MG PO TABS
200.0000 mg | ORAL_TABLET | Freq: Two times a day (BID) | ORAL | Status: DC
  Administered 2021-09-18 – 2021-09-22 (×8): 200 mg
  Filled 2021-09-18 (×8): qty 1

## 2021-09-18 MED ORDER — INSULIN DETEMIR 100 UNIT/ML ~~LOC~~ SOLN
20.0000 [IU] | Freq: Two times a day (BID) | SUBCUTANEOUS | Status: DC
  Administered 2021-09-18: 20 [IU] via SUBCUTANEOUS
  Filled 2021-09-18 (×3): qty 0.2

## 2021-09-18 MED ORDER — BISACODYL 10 MG RE SUPP: 10.0000 mg | Freq: Every day | RECTAL | Status: DC

## 2021-09-18 MED ORDER — AMIODARONE HCL 200 MG PO TABS
200.0000 mg | ORAL_TABLET | Freq: Two times a day (BID) | ORAL | Status: DC
  Administered 2021-09-18: 200 mg via ORAL
  Filled 2021-09-18: qty 1

## 2021-09-18 MED ORDER — VANCOMYCIN HCL IN DEXTROSE 1-5 GM/200ML-% IV SOLN: 1000.0000 mg | Freq: Once | INTRAVENOUS | Status: DC

## 2021-09-18 MED ORDER — PROSOURCE TF PO LIQD
45.0000 mL | Freq: Two times a day (BID) | ORAL | Status: DC
  Administered 2021-09-18 – 2021-09-22 (×8): 45 mL
  Filled 2021-09-18 (×8): qty 45

## 2021-09-18 MED ORDER — WARFARIN SODIUM 5 MG PO TABS
5.0000 mg | ORAL_TABLET | Freq: Once | ORAL | Status: AC
  Administered 2021-09-18: 5 mg via ORAL
  Filled 2021-09-18: qty 1

## 2021-09-18 MED ORDER — VITAL 1.5 CAL PO LIQD
1000.0000 mL | ORAL | Status: DC
  Administered 2021-09-18 – 2021-09-22 (×5): 1000 mL

## 2021-09-18 NOTE — Progress Notes (Signed)
[place] for warfarin Indication:  bioprosthetic aortic valve replacement and a fib  Allergies  Allergen Reactions   Pseudoephedrine Other (See Comments)    Patient went into afib   Diltiazem Rash and Itching    Also 2019   Novocain [Procaine] Hives    Dentist appointment in 1958; since then has tolerated lidocaine and provocaine with no hives or difficulty.   Quinolones     Patient was warned about not using Cipro and similar antibiotics. Recent studies have raised concern that fluoroquinolone antibiotics could be associated with an increased risk of aortic aneurysm Fluoroquinolones have non-antimicrobial properties that might jeopardise the integrity of the extracellular matrix of the vascular wall In a  propensity score matched cohort study in [place], there was a 66% increased rate of aortic aneurysm or dissection associated with oral fluoroquinolone use, compared wit   Sulfonamide Derivatives Other (See Comments)    Childhood reaction     Patient Measurements: Height: 6' 1"  (185.4 cm) Weight: 133.6 kg (294 lb 8.6 oz) IBW/kg (Calculated) : 79.9  Vital Signs: Temp: 99.7 F (37.6 C) (09/20 0900) Temp Source: Bladder (09/20 0900) BP: 110/60 (09/20 0900) Pulse Rate: 55 (09/20 0900)  Labs: Recent Labs    09/16/21 0352 09/17/21 0401 09/17/21 0428 09/17/21 1545 09/18/21 0349  HGB 10.4* 11.4* 10.9* 11.2* 11.5*  HCT 33.2* 36.2* 32.0* 33.0* 36.6*  PLT 159 193  --   --  203  LABPROT 23.1* 22.3*  --   --  20.9*  INR 2.1* 2.0*  --   --  1.8*  CREATININE 1.68* 1.73*  --   --  1.89*     Estimated Creatinine Clearance: 51.4 mL/min (A) (by C-G formula based on SCr of 1.89 mg/dL (H)).   Medical History: Past Medical History:  Diagnosis Date   Anxiety    Atrial fibrillation ([place]) 03/22/2009   a. s/p multiple DCCV; b. no coumadin due to low TE risk profile; c. Tikosyn Rx   COPD (chronic obstructive pulmonary disease) (HCC)    Coronary  atherosclerosis of native coronary artery 11/2002   a. s/p stent to LAD 12/03; OM2 occluded at cath 12/03; d. myoview 5/10: no ischemia;  e. echo 7/11: EF 55%, BAE, mild RVE, PASP 41-45; Myoview was in March 2013. There was no ischemia or infarction, EF 51%    Cutaneous abscess of back excluding buttocks 07/04/2014   Appears to stem from possibly a cyst very large area 6 cm contact surgeon office    Diabetes mellitus without complication ([place])    Drusen body    see opth note   Dyspnea    Dysrhythmia    Epiglottitis    w emergency nt intubation   ERECTILE DYSFUNCTION 03/22/2009   GERD 03/22/2009   Heart murmur    HYPERGLYCEMIA 04/25/2010   HYPERLIPIDEMIA 03/22/2009   Hypertension    Iliac aneurysm ([place])    2.6 to be evaluated incidental finding on CT   LATERAL EPICONDYLITIS, LEFT 10/24/2009   LIVER FUNCTION TESTS, ABNORMAL 04/25/2010   Local reaction to immunization 05/05/2012   minor resolving  zostavax    Myocardial infarction [facility]) mi2003   Numbness in left leg    foot related to back disease and surgery   Obesity, unspecified 04/24/2009   Perforated appendicitis with necrosis s/p open appendectomy 06/07/14 06/04/2014   Renal cyst    Characterized by MRI as simple   Ruptured suppurative appendicitis    2015    SLEEP APNEA, OBSTRUCTIVE  03/22/2009   compliant with CPAP   THROMBOCYTOPENIA 08/16/2010   TOBACCO USE, QUIT 10/24/2009   ULNAR NEUROPATHY, LEFT 97/58/8325   Umbilical hernia     Medications:  Scheduled:   amiodarone  200 mg Oral BID   arformoterol  15 mcg Nebulization BID   aspirin  81 mg Per Tube Daily   atorvastatin  80 mg Per Tube Daily   bisacodyl  10 mg Rectal Q0600   brimonidine  1 drop Both Eyes TID   busPIRone  15 mg Per Tube BID   chlorhexidine gluconate (MEDLINE KIT)  15 mL Mouth Rinse BID   Chlorhexidine Gluconate Cloth  6 each Topical Daily   clonazePAM  1 mg Per Tube TID   docusate  200 mg Per Tube Daily   feeding supplement (PROSource TF)  90  mL Per Tube TID   feeding supplement (VITAL 1.5 CAL)  1,000 mL Per Tube Q24H   fluticasone  2 spray Each Nare Daily   insulin aspart  0-20 Units Subcutaneous Q4H   insulin detemir  20 Units Subcutaneous BID   mouth rinse  15 mL Mouth Rinse 10 times per day   midodrine  15 mg Per Tube TID WC   pantoprazole sodium  40 mg Per Tube Daily   PARoxetine  60 mg Per Tube Daily   polyethylene glycol  17 g Per Tube Daily   revefenacin  175 mcg Nebulization Daily   senna  2 tablet Per Tube QHS   sodium chloride flush  3 mL Intravenous Q12H   Warfarin - Pharmacist Dosing Inpatient   Does not apply q1600    Assessment: 70 yo M s/p bioprosthetic AVR, atrial clip, and MAZE procedure on 9/8. Patient has a PMH of a fib on apixaban PTA and has been in a fib s/p MAZE procedure currently on an amio drip.   INR subtherapeutic 1.8  today, CBC remains stable, hemoglobin and platelets up-trending. ASA dropped to 81 mg 3 days ago.  Goal of Therapy:  INR 2-3 Monitor platelets by anticoagulation protocol: Yes   Plan:  Warfarin 5 mg x1 tonight Daily INR  Cathrine Muster, PharmD PGY2 Cardiology Pharmacy Resident Phone: [phone] 09/18/2021  10:52 AM  Please check [email or web address] for unit-specific pharmacy phone numbers.

## 2021-09-18 NOTE — Progress Notes (Signed)
12 Days Post-Op Procedure(s) (LRB): AORTIC VALVE REPLACEMENT (AVR) USING INSPIRIS AORTIC VALVE 27 MM (N/A) MAZE (N/A) TRANSESOPHAGEAL ECHOCARDIOGRAM (TEE) (N/A) APPLICATION OF CELL SAVER CLIPPING OF ATRIAL APPENDAGE USING ATRICLIP PRO 240 Subjective:  Extubated to bipap today.  Hemodynamics stable with CVP 10, Co-ox 70%  Objective: Vital signs in last 24 hours: Temp:  [99 F (37.2 C)-100 F (37.8 C)] 99 F (37.2 C) (09/20 1300) Pulse Rate:  [55-100] 98 (09/20 1300) Cardiac Rhythm: Normal sinus rhythm;Sinus tachycardia (09/20 1300) Resp:  [22-30] 22 (09/20 1300) BP: (93-153)/(52-98) 137/86 (09/20 1300) SpO2:  [92 %-100 %] 94 % (09/20 1300) Arterial Line BP: (108-181)/(48-83) 150/70 (09/20 1200) FiO2 (%):  [50 %] 50 % (09/20 1221) Weight:  [133.6 kg] 133.6 kg (09/20 0347)  Hemodynamic parameters for last 24 hours: CVP:  [7 mmHg-17 mmHg] 10 mmHg  Intake/Output from previous day: 09/19 0701 - 09/20 0700 In: 3562.5 [I.V.:2305.9; NG/GT:750; IV Piggyback:506.7] Out: 3382 [Urine:6520] Intake/Output this shift: Total I/O In: 252.3 [I.V.:252.3] Out: 1000 [Urine:1000]  General appearance: looks comfortable on bipap. Neurologic: intact Heart: regular rate and rhythm, S1, S2 normal, no murmur Lungs: clear to auscultation bilaterally Abdomen: soft, non-tender; bowel sounds normal Extremities: edema mild Wound: incision healing well.  Lab Results: Recent Labs    09/17/21 0401 09/17/21 0428 09/17/21 1545 09/18/21 0349  WBC 10.3  --   --  10.4  HGB 11.4*   < > 11.2* 11.5*  HCT 36.2*   < > 33.0* 36.6*  PLT 193  --   --  203   < > = values in this interval not displayed.   BMET:  Recent Labs    09/17/21 0401 09/17/21 0428 09/17/21 1545 09/18/21 0349  NA 144   < > 144 142  K 3.8   < > 4.1 3.8  CL 105  --   --  100  CO2 28  --   --  29  GLUCOSE 183*  --   --  245*  BUN 75*  --   --  85*  CREATININE 1.73*  --   --  1.89*  CALCIUM 9.2  --   --  9.5   < > = values  in this interval not displayed.    PT/INR:  Recent Labs    09/18/21 0349  LABPROT 20.9*  INR 1.8*   ABG    Component Value Date/Time   PHART 7.462 (H) 09/17/2021 1545   HCO3 30.2 (H) 09/17/2021 1545   TCO2 31 09/17/2021 1545   ACIDBASEDEF 1.0 09/12/2021 0322   O2SAT 70.1 09/18/2021 0349   CBG (last 3)  Recent Labs    09/18/21 0357 09/18/21 0816 09/18/21 1155  GLUCAP 256* 222* 189*    Assessment/Plan: S/P Procedure(s) (LRB): AORTIC VALVE REPLACEMENT (AVR) USING INSPIRIS AORTIC VALVE 27 MM (N/A) MAZE (N/A) TRANSESOPHAGEAL ECHOCARDIOGRAM (TEE) (N/A) APPLICATION OF CELL SAVER CLIPPING OF ATRIAL APPENDAGE USING ATRICLIP PRO 240  Hemodynamically stable on midodrine 15 tid, NE 2.  Stable s/p extubation today on 50% bipap.  Diuresed -4L yesterday on lasix drip 12. Creat up to 1.89. Lasix decreased to 8. Wt is below preop.  Continues on antibiotics for possible HCAP.  Aortic valve cultures negative c/w chronic aortic valve endocarditis.   LOS: 12 days    [name] 09/18/2021

## 2021-09-18 NOTE — Progress Notes (Signed)
NAME:  [name] Stripling., MRN:  [id number], DOB:  06-Sep-1951, LOS: 12 ADMISSION DATE:  09/28/2021, CONSULTATION DATE:  09/07/21 REFERRING MD:  Dr. [name], CHIEF COMPLAINT:  Resp failure   History of Present Illness:  HPI obtained from medical chart review as patient is intubated and sedated on mechanical ventilation.   70 year old male with prior history of prior tobacco abuse (quit 2010), centrilobular emphysema, chronic hypoxic respiratory failure on home O2 (?4L [place]), pulmonary hypertension, OSA on CPAP, PAF, CAD w/ LAD stent 2003, HLD, MSSA sepsis c/b lumbar discitis and infected left sternoclavicular joint 08/2019 who recently had worsening exertional dyspnea, orthopnea, and fatigue.  Had TTE in 04/2021 which showed new moderate to severe AI with normal EF 55-60 and new dilation of LV cavity with trivial MR and mild TR, normal RV.  Subsequently, underwent TEE on 07/10/21 which shoed EF 55-60%, mild dilation of ascending aorta, and severe AI.  Underwent [facility]  07/16/21 which showed patent LAD stent, mild diffuse nonobstructive CAD, moderate pulmonary hypertension with PA 67/20 (39) and a pulmonary wedge pressure of 84mHg.  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  Found to have AV vegetations which where sent for culture.  EBL during surgery 3025 ml with 1870 ml in blood products/ cell saver.  Patient did have some trouble transitioning off CPB with high PA pressures with struggling systemic pressures and RV, therefore ended up on NO and vasopressor support with milrinone.  He remains sedated and intubated with tenuous hemodynamics today.  PCCM consulted to help with vent management.  HF also being consulted to help with pulmonary hypertension.    Of note, patient is followed in our pulmonary office by Dr. SHalford Chessman last seen 08/10/21 by BDerl Barrow NP.  Previous CTA 6/22 neg for PE, noted underlying emphysematous changes with areas  of fibrosis and scarring, nodular opacity in RML (smaller now than previous imaging).  Reported compliance with CPAP and anoro.  PFT 08/09/21- FVC 3.42 (75%), FEV1 2. 60 (77%), ratio 76, DLCOunc 10/29 (38%).  Uses O2 2L at rest, and 3-4L with exertions for goal sats > 88-90%.  Pertinent  Medical History  Former Tobacco abuse (quit 2010), centrilobular emphysema, pulmonary hypertension, OSA on CPAP, PAF, CAD w/ LAD stent 2003, HLD, MSSA sepsis c/b lumbar discitis and infected left sternoclavicular joint 08/2019   Significant Hospital Events: Including procedures, antibiotic start and stop dates in addition to other pertinent events   9/8 aortic valve replacement with bioprosthetic valve and biatrial Maze procedure 9/8 ETT >> 9/8 foley >> 9/8 R IJ cordis with PA cath >> 9/8 L radial Aline >> 9/8 L femoral aline (placed in OR given variable L radial tracings)>> 9/8 parasternal pacing wires >> 9/8 mediastinal CT x 2 >>  9/8 aortic tissue cx >> 9/8 aortic tissue AFB >> 9/8 aortic fungal >> 9/9 Bcx 2 >>  9/8 ancef >>9/9 9/8 vanc >> 9/9 cefepime >> 9/15: L lung white out req bronch  9/17: off nitric, lasix ggt, self extubated   Interim History / Subjective:  No events. Diuresed well. Cr creeping up.  Objective   Blood pressure (!) 97/59, pulse (!) 55, temperature 99.7 F (37.6 C), resp. rate (!) 30, height _0  (1.854 m), weight 133.6 kg, SpO2 98 %. PAP: (33-41)/(27-35) 39/33 CVP:  [7 mmHg-17 mmHg] 8 mmHg  Vent Mode: PRVC FiO2 (%):  [50 %] 50 % Set Rate:  [30 bmp]  30 bmp Vt Set:  [630 mL] 630 mL PEEP:  [8 [name]20] 8 [name]20 Plateau Pressure:  [9 [name]20-25 [name]20] 24 [name]20   Intake/Output Summary (Last 24 hours) at 09/18/2021 0853 Last data filed at 09/18/2021 0700 Gross per 24 hour  Intake 3400.99 ml  Output 7170 ml  Net -3769.01 ml    Filed Weights   09/16/21 0435 09/17/21 0400 09/18/21 0347  Weight: (!) 140.3 kg 135.9 kg 133.6 kg    Examination: Sedated, no  distress Improving LE edema Lungs remain diminished at bases Too sedated to trigger vent Abdomen soft, +BS  Resolved Hospital Problem list    Assessment & Plan:   Acute on chronic hypoxic respiratory failure (on home 4L O2 at home) OSA, on home CPAP.  Baseline poor pulmonary reserve making weaning difficult.  He still appears wet on CXR and CVP.   Kidneys not liking diuresis very much. 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 Question HCAP, BAL w/ normal flora; secretion burden improved with abx Question parapneumonic effusion- drained 9/19; either post AVR inflammatory or parapneumonic, cultures pending Decompensated RV failure with shock, PVR preserved on cath in July AI s/p AVR and MAZE procedure Hx PAF, CAD with LAD stent AKI on CKD- a little worse with diuresis but tolerating Thrombocytopenia- improving Ileus vs constipation- +BM 2 days ago Class 3 obesity DM2  P: Drop lasix to 52m/hr, may need holiday tomorrow  Trial of extubation to BIPAP: deferred yesterday, will try today  No ileus on KUB, push bowel regimen  F/u pleural fluid cultures  5 days HCAP coverage  Basal bolus insulin as ordered  Best Practice (right click and "Reselect all SmartList Selections" daily)   Diet/type: tubefeeds DVT prophylaxis: warfarin GI prophylaxis: PPI Lines: yes and it is still needed Foley:  Yes, and it is still needed Code Status:  full code Last date of multidisciplinary goals of care discussion [wife updated at bedside on 9/20]  Patient critically ill due to respiratory failure Interventions to address this today vent titration Risk of deterioration without these interventions is high  I personally spent 41 minutes providing critical care not including any separately billable procedures  DErskine EmeryMD [place] [facility]  Prefer epic messenger for cross cover needs If after hours, please call E-link

## 2021-09-18 NOTE — H&P (Signed)
Extubation Procedure Note  Patient Details:   Name: [name] [name]. DOB: 1951/08/21 MRN: [id number]   Airway Documentation:  Airway 8 mm (Active)  Secured at (cm) 26 cm 09/18/21 1151  Measured From Lips 09/18/21 1151  Secured Location Right 09/18/21 0830  Secured By [employer] 09/18/21 1151  Tube Holder Repositioned Yes 09/18/21 0830  Prone position No 09/18/21 0325  Cuff Pressure (cm H2O) Green OR 18-26 [name]2O 09/17/21 2003  Site Condition Dry 09/18/21 1151   Vent end date: (not recorded) Vent end time: (not recorded)   Evaluation  O2 sats: stable throughout and currently acceptable Complications: No apparent complications Patient did tolerate procedure well. Bilateral Breath Sounds: Clear, Diminished   No  Patient was extubated to bipap per MD order. Cuff leak was heard. No stridor noted. RN, patient's wife, and additional RT at bedside during extubation.  Tiburcio Bash 09/18/2021, 12:29 PM

## 2021-09-18 NOTE — Progress Notes (Signed)
Patient ID: [name] Diekman., male   DOB: 03-25-1951, 70 y.o.   MRN: [id number]     Advanced Heart Failure Rounding Note  PCP-Cardiologist: [name], MD   Subjective:    9/11: Echo EF 40-45% RV ok. RVSP 22.8  9/14 - 9/15 Developed hypoxia, CXR with left lung white-out, underwent bronchoscopy with treatment of mucus plug. Placed on norepi overnight.  9/16: iNO stopped.  9/17: Self-extubated, had to be reintubated.  9/19: Left thoracentesis  Remains intubated. FiO2 50%. Failed weaning trial yesterday.   Off milrinone and on NE 4, co-ox 70%.     Patient is in NSR today on amiodarone gtt.   On lasix gtt at 12/hr + metolazone 2.5 yesterday. Good UOP, weight down 5 lbs and below pre-op baseline though CVP remains 11-12. SCr up slightly,1.5 => 1.6 => 1.59 => 1.66=>1.73 => 1.85.   Vanc/cefepime restarted 9/16 for HCAP, afebrile.   Objective:   Weight Range: 133.6 kg Body mass index is 38.86 kg/m.   Vital Signs:   Temp:  [98.4 F (36.9 C)-100 F (37.8 C)] 99.7 F (37.6 C) (09/20 0700) Pulse Rate:  [55-60] 55 (09/20 0400) Resp:  [22-30] 30 (09/20 0700) BP: (63-140)/(36-98) 97/59 (09/20 0700) SpO2:  [88 %-100 %] 94 % (09/20 0700) Arterial Line BP: (81-181)/(38-83) 116/50 (09/20 0700) FiO2 (%):  [40 %-50 %] 50 % (09/20 0400) Weight:  [133.6 kg] 133.6 kg (09/20 0347) Last BM Date: 09/16/21  Weight change: Filed Weights   09/16/21 0435 09/17/21 0400 09/18/21 0347  Weight: (!) 140.3 kg 135.9 kg 133.6 kg    Intake/Output:   Intake/Output Summary (Last 24 hours) at 09/18/2021 0738 Last data filed at 09/18/2021 0700 Gross per 24 hour  Intake 3562.53 ml  Output 7520 ml  Net -3957.47 ml      Physical Exam   CVP 11-12  General: NAD Neck: Thick, JVP 10, no thyromegaly or thyroid nodule.  Lungs: Decreased at bases.  CV: Nondisplaced PMI.  Heart regular S1/S2, no S3/S4, 2/6 SEM RUSB.  Trace ankle edema.  Abdomen: Soft, nontender, no hepatosplenomegaly, no distention.   Skin: Intact without lesions or rashes.  Neurologic: Alert and oriented x 3.  Psych: Normal affect. Extremities: No clubbing or cyanosis.  HEENT: Normal.    Telemetry   NSR 50s-60s (personally reviewed)   EKG    N/A  Labs    CBC Recent Labs    09/17/21 0401 09/17/21 0428 09/17/21 1545 09/18/21 0349  WBC 10.3  --   --  10.4  HGB 11.4*   < > 11.2* 11.5*  HCT 36.2*   < > 33.0* 36.6*  MCV 94.8  --   --  93.6  PLT 193  --   --  203   < > = values in this interval not displayed.   Basic Metabolic Panel Recent Labs    09/17/21 0401 09/17/21 0428 09/17/21 1545 09/18/21 0349  NA 144   < > 144 142  K 3.8   < > 4.1 3.8  CL 105  --   --  100  CO2 28  --   --  29  GLUCOSE 183*  --   --  245*  BUN 75*  --   --  85*  CREATININE 1.73*  --   --  1.89*  CALCIUM 9.2  --   --  9.5  MG 2.0  --   --  2.1   < > = values in this interval not displayed.  Liver Function Tests No results for input(s): AST, ALT, ALKPHOS, BILITOT, PROT, ALBUMIN in the last 72 hours.  No results for input(s): LIPASE, AMYLASE in the last 72 hours. Cardiac Enzymes No results for input(s): CKTOTAL, CKMB, CKMBINDEX, TROPONINI in the last 72 hours.  BNP: BNP (last 3 results) No results for input(s): BNP in the last 8760 hours.  ProBNP (last 3 results) No results for input(s): PROBNP in the last 8760 hours.   D-Dimer No results for input(s): DDIMER in the last 72 hours. Hemoglobin A1C No results for input(s): HGBA1C in the last 72 hours. Fasting Lipid Panel No results for input(s): CHOL, HDL, LDLCALC, TRIG, CHOLHDL, LDLDIRECT in the last 72 hours. Thyroid Function Tests No results for input(s): TSH, T4TOTAL, T3FREE, THYROIDAB in the last 72 hours.  Invalid input(s): FREET3  Other results:   Imaging    DG CHEST PORT 1 VIEW  Result Date: 09/17/2021 CLINICAL DATA:  Status post central line placement EXAM: PORTABLE CHEST 1 VIEW COMPARISON:  Film from earlier in the same day. FINDINGS:  Endotracheal tube, feeding catheter and Swan-Ganz catheter are again noted and stable. New left jugular central line is noted with the catheter tip in the mid superior vena cava. No pneumothorax is seen. Cardiac shadow is stable. Postsurgical changes are noted. Vascular congestion is noted with some mild interstitial edema in the bases. IMPRESSION: No pneumothorax following central line placement. Mild vascular congestion and basilar edema. Electronically Signed   By: Inez Catalina M.D.   On: 09/17/2021 15:40   DG Chest Port 1 View  Result Date: 09/17/2021 CLINICAL DATA:  Follow-up left thoracentesis EXAM: PORTABLE CHEST 1 VIEW COMPARISON:  09/17/2021 FINDINGS: Endotracheal tube tip 4 cm above the carina. Soft feeding tube enters the abdomen. Swan-Ganz catheter tip in the right main pulmonary artery. Lung bases are not included on the film. There is persistent pleural density on the left with left lower lung volume loss. There is no evidence of left side pneumothorax postprocedure. IMPRESSION: Lower portion of the chest not included on the image. No sign of left pneumothorax following thoracentesis. Electronically Signed   By: Nelson Chimes M.D.   On: 09/17/2021 10:16   DG Abd Portable 1V  Result Date: 09/17/2021 CLINICAL DATA:  Ileus EXAM: PORTABLE ABDOMEN - 1 VIEW COMPARISON:  September 16 FINDINGS: Enteric tube with tip appearing to be in the first portion of the duodenum. There are scattered gas-filled loops of large with a paucity of small bowel gas. There appears to be a calcified gallstone in the right upper quadrant. Vascular calcifications. No acute osseous abnormality. IMPRESSION: Stable positioning of enteric tube with tip overlying the first portion of duodenum. No significantly dilated loops of bowel seen. Electronically Signed   By: Albin Felling M.D.   On: 09/17/2021 09:17     Medications:     Scheduled Medications:  arformoterol  15 mcg Nebulization BID   aspirin  81 mg Per Tube Daily    atorvastatin  80 mg Per Tube Daily   bisacodyl  10 mg Oral Daily   Or   bisacodyl  10 mg Rectal Daily   brimonidine  1 drop Both Eyes TID   busPIRone  15 mg Per Tube BID   chlorhexidine gluconate (MEDLINE KIT)  15 mL Mouth Rinse BID   Chlorhexidine Gluconate Cloth  6 each Topical Daily   clonazePAM  1 mg Per Tube TID   docusate  200 mg Per Tube Daily   feeding supplement (PROSource TF)  90 mL Per Tube TID   feeding supplement (VITAL 1.5 CAL)  1,000 mL Per Tube Q24H   fluticasone  2 spray Each Nare Daily   insulin aspart  0-20 Units Subcutaneous Q4H   insulin detemir  30 Units Subcutaneous BID   mouth rinse  15 mL Mouth Rinse 10 times per day   midodrine  15 mg Per Tube TID WC   pantoprazole sodium  40 mg Per Tube Daily   PARoxetine  60 mg Per Tube Daily   polyethylene glycol  17 g Per Tube Daily   potassium chloride  40 mEq Oral Once   revefenacin  175 mcg Nebulization Daily   senna  2 tablet Per Tube QHS   sodium chloride flush  3 mL Intravenous Q12H   Warfarin - Pharmacist Dosing Inpatient   Does not apply q1600    Infusions:  sodium chloride 10 mL/hr at 09/13/21 0816   sodium chloride     sodium chloride Stopped (09/17/21 1700)   amiodarone 30 mg/hr (09/18/21 0700)   ceFEPime (MAXIPIME) IV Stopped (09/18/21 3818)   dexmedetomidine (PRECEDEX) IV infusion 1.2 mcg/kg/hr (09/18/21 0700)   fentaNYL infusion INTRAVENOUS 175 mcg/hr (09/18/21 0700)   furosemide (LASIX) 200 mg in dextrose 5% 100 mL (68m/mL) infusion 12 mg/hr (09/18/21 0700)   lactated ringers 10 mL/hr at 09/18/21 0700   lactated ringers 20 mL/hr at 09/10/21 1517   norepinephrine (LEVOPHED) Adult infusion 4 mcg/min (09/18/21 0700)    PRN Medications: sodium chloride, dextrose, fentaNYL, levalbuterol, midazolam, morphine injection, ondansetron (ZOFRAN) IV, sodium chloride flush    Patient Profile   70y.o. with history of COPD/smoking, chronic severe aortic insufficiency, paroxysmal atrial fibrillation,  CAD, type 2 diabetes, and CKD stage 3.  Patient had PCI to LAD in 12/03, LHC in 7/22 showed only mild CAD.  He has paroxysmal atrial fibrillation and had been maintained on Tikosyn.  He had not been ablated due to obesity.  Patient had lumbar discitis and left sternoclavicular joint infection in 9/20, had MSSA.  He had I&D left [place] joint and IV abx.  Echo at the time showed no vegetation and trivial AI.    HF team consulted for shock/pulmonary hypertension.   Assessment/Plan   1. Aortic insufficiency: TEE pre-op with severe AI, tricuspid aortic valve.  The LV was moderately dilated.  Now s/p bioprosthetic AVR.  The native valve was perforated with possible vegetation.  Suspect he did not have acute endocarditis, tissue cultures remain negative.  Rheumatological serologic workup negative.  - continue supportive care 2. Shock: Post-op.  Suspect mixed picture with RV dysfunction/severe pulmonary hypertension as well as likely distributive/septic component with low SVR.  Remains afebrile.  On NE 4, midodrine 15 tid, Co-ox 70%. Off milrinone.   - Almost off NE, continue wean.  - Continue midodrine 15 mg tid.   - Vanc/cefepime restarted 9/16 for HCAP (5 days coverage).  3.  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  Post-op echo with LV down 40-45% (post-correction of severe AI), normal RV, normal bioprosthetic aortic valve.  Good diuresis with Lasix gtt + metolazone yesterday. Weight down 5 lbs and below pre-op though CVP still 11-12.  Creatinine 1.89, higher.  - Cut back Lasix gtt to 8 mg/hr and will not give metolazone.  4.  Pulmonary hypertension: Suspect mixed group 2/3 pulmonary hypertension + high output.  Has significant lung parenchymal disease, CTA chest in 6/22 with emphysema as well as areas of fibrosis, as well as OSA and ?OHS.  Pre-op was on CPAP  and 4L home oxygen.  Initially, peri-operative PA pressure was very high, in the 90/45 range. Now off iNO. PA pressures were significantly lower when Swan still in place and PVR was low.  5. Acute on chronic hypoxemic respiratory failure: Intubated post-op.  Baseline COPD + OSA + ?OHS and on home oxygen 4 liters. Now with post-op volume overload as well. 9/15 mucus plug with left lung white-out, S/P Bronch with removal of plug - sputum cx pending. Repeat CXR post-bronch significantly improved.  Self-extubated 9/17, had to be reintubated.  Failed weaning trial yesterday, had thoracentesis on left.  FiO2 0.5 today.  - Vent wean per CCM.  Garlon Hatchet and Yupelri ordered  - Continue diuresis but decrease Lasix with rise in creatinine and fall in weight.  - Continue abx.  6. ID: Cultures including tissue culture from valve so far negative.  9/20 MSSA discitis.  ID following. Concern for possible distributive shock component. Blood cultures from 9/9 with NGTD. WBC normal, afebrile. PCT 0.33.  - Continue vancomycin/cefepime x 5 days for HCAP.  7. AKI on CKD stage 3: Creatinine higher at 1.89.  - Decrease Lasix, no metolazone today.  8. Atrial fibrillation: Paroxysmal.  On Tikosyn pre-op.  Had Maze procedure. In AF currently. Now back in NSR.   - Amiodarone to po.  - Continue warfarin.   CRITICAL CARE Performed by: Loralie Champagne  Total critical care time: 35 minutes  Critical care time was exclusive of separately billable procedures and treating other patients.  Critical care was necessary to treat or prevent imminent or life-threatening deterioration.  Critical care was time spent personally by me on the following activities: development of treatment plan with patient and/or surrogate as well as nursing, discussions with consultants, evaluation of patient's response to treatment, examination of patient, obtaining history from patient or surrogate, ordering and performing treatments and interventions,  ordering and review of laboratory studies, ordering and review of radiographic studies, pulse oximetry and re-evaluation of patient's condition.  Loralie Champagne 09/18/2021 7:38 AM

## 2021-09-18 NOTE — Progress Notes (Signed)
Pharmacy Antibiotic Note  [name] [name]. is a 70 y.o. male admitted on 09/26/2021 with AVR. Pt has hx MSSA bacteremia/discitis s/p cefazolin in 2020. Pt w/ question of vegetations on valve leaflets and possible recurrent endocarditis and was treated with vancomycin + cefepime which was de-escalated to vancomycin + ceftriaxone. Antibiotics were stopped per ID as cultures showed no growth.  Patient overnight 9/14-9/15 developed mucus plugging and underwent bronchoscopy. Pharmacy has been consulted for vancomycin and cefepime dosing for possible HCAP.   During suspected endocarditis patient had vancomycin peak and troughs drawn and dose adjusted based upon AUC. (VP of 33 and VT of 17- AUC of 607 on 1250 mg IV q 24h changed to 1000 mg q24 h - estimated AUC 486).  Serum creatine has climbed since initial dosing, up to 1.89 today.  Vancomycin is now complete x 5 days. Cefepime will finish tonight. Adjusted cefepime q 8h to q 12h due to renal function  Plan: Cefepime 2g IV q 12h Monitor renal fx, cx results, clinical pic  Height: _0  (185.4 cm) Weight: 133.6 kg (294 lb 8.6 oz) IBW/kg (Calculated) : 79.9  Temp (24hrs), Avg:99.7 F (37.6 C), Min:98.8 F (37.1 C), Max:100 F (37.8 C)  Recent Labs  Lab 09/11/21 1118 09/11/21 1823 09/14/21 0311 09/15/21 0359 09/16/21 0352 09/17/21 0401 09/18/21 0349  WBC  --    < > 9.1 9.8 9.7 10.3 10.4  CREATININE  --    < > 1.59* 1.63* 1.68* 1.73* 1.89*  VANCOTROUGH 17  --   --   --   --   --   --    < > = values in this interval not displayed.     Estimated Creatinine Clearance: 51.4 mL/min (A) (by C-G formula based on SCr of 1.89 mg/dL (H)).    Allergies  Allergen Reactions   Pseudoephedrine Other (See Comments)    Patient went into afib   Diltiazem Rash and Itching    Also 2019   Novocain [Procaine] Hives    Dentist appointment in 1958; since then has tolerated lidocaine and provocaine with no hives or difficulty.   Quinolones      Patient was warned about not using Cipro and similar antibiotics. Recent studies have raised concern that fluoroquinolone antibiotics could be associated with an increased risk of aortic aneurysm Fluoroquinolones have non-antimicrobial properties that might jeopardise the integrity of the extracellular matrix of the vascular wall In a  propensity score matched cohort study in [place], there was a 66% increased rate of aortic aneurysm or dissection associated with oral fluoroquinolone use, compared wit   Sulfonamide Derivatives Other (See Comments)    Childhood reaction     Antimicrobials this admission: Cefazolin 9/8 >> 9/9 Vancomycin 9/9 >> 9/16, 9/16 >> Cefepime 9/9 >> 9/12, 9/16>> Ceftriaxone 9/12 >> 9/14  Microbiology results: 9/14 BAL abundant WBCs, rare gram pos cocci in pairs + rare gram neg rods, ngtd 9/8 Valve cx - ngtd 9/9 Bcx - ngtd    Thank you for allowing pharmacy to be a part of this patient's care.  Cathrine Muster, PharmD PGY2 Cardiology Pharmacy Resident Phone: [phone] 09/18/2021  11:07 AM   Please check AMION for all [place] phone numbers After 10:00 PM, call [place] [phone]

## 2021-09-18 NOTE — Progress Notes (Signed)
Nutrition Follow-up  DOCUMENTATION CODES:   Not applicable  INTERVENTION:   Increase tube feeding: -Vital 1.5 @ 65 ml/hr via Cortrak (1560 ml) -Prosource TF 45 ml BID  At goal rate TF provides 2420 kcal, 127 gm protein, 1192 ml free water daily.  NUTRITION DIAGNOSIS:   Inadequate oral intake related to acute illness as evidenced by NPO status.  Ongoing   GOAL:   Patient will meet greater than or equal to 90% of their needs  Addressed via TF  MONITOR:   Vent status, TF tolerance, Labs, Weight trends  REASON FOR ASSESSMENT:   Consult Enteral/tube feeding initiation and management  ASSESSMENT:   70 yo male admitted with acute on chronic respiratory failure requiring vent support, AV vegetations suggestive of endocarditis, AKI on CKD. PMH includes CKD III, DM, COPD, CHF, HTN  9/08- s/p AVR & TEE 9/17- self extubated, re-intubated  9/19- s/p L thoracentesis  Patient extubated to BiPAP. Diuresing with lasix. Xray from yesterday confirms no ileus. Had BM three days ago. Continue scheduled bowel regimen. Cortrak migrated to first portion of duodenum. Increase tube feeding to better meet needs now that patient is extubated.   Admission weight: 143 kg  Current weight: 133.6 kg   UOP: 6520 ml x 24 hrs   Drips: 200 mg lasix in D5 @ 4 ml/hr, levophed Medications: dulcolax, colace, SS novolog, levemir, miralax Labs: CBG 73-256  Diet Order:   Diet Order             Diet NPO time specified  Diet effective now                   EDUCATION NEEDS:   Not appropriate for education at this time  Skin:  Skin Assessment: Skin Integrity Issues: Skin Integrity Issues:: Other (Comment), Incisions Incisions: chest Other: skin tear R arm  Last BM:  9/16  Height:   Ht Readings from Last 1 Encounters:  09/14/2021 6\' 1"  (1.854 m)    Weight:   Wt Readings from Last 1 Encounters:  09/18/21 133.6 kg    BMI:  Body mass index is 38.86 kg/m.  Estimated Nutritional  Needs:   Kcal:  2300-2500 kcal  Protein:  115-130 grams  Fluid:  >/= 2 L/day  Mariana Single MS, RD, LDN, CNSC Clinical Nutrition Pager listed in [place]

## 2021-09-18 NOTE — Progress Notes (Signed)
RT NOTE: oxygen saturation goal 88% or greater per Dr.Smith.

## 2021-09-18 NOTE — Progress Notes (Addendum)
EVENING ROUNDS NOTE :     [address]       [place], [address]             [phone]                 12 Days Post-Op Procedure(s) (LRB): AORTIC VALVE REPLACEMENT (AVR) USING INSPIRIS AORTIC VALVE 27 MM (N/A) MAZE (N/A) TRANSESOPHAGEAL ECHOCARDIOGRAM (TEE) (N/A) APPLICATION OF CELL SAVER CLIPPING OF ATRIAL APPENDAGE USING ATRICLIP PRO 240  Total Length of Stay:  LOS: 12 days  BP 128/75 (BP Location: Right Arm)   Pulse (!) 101   Temp 98.6 F (37 C) (Bladder)   Resp (!) 21   Ht 6\' 1"  (1.854 m)   Wt 133.6 kg   SpO2 95%   BMI 38.86 kg/m   .Intake/Output      09/19 0701 09/20 0700 09/20 0701 09/21 0700   I.V. (mL/kg) 2305.9 (17.3) 252.3 (1.9)   NG/GT 750    IV Piggyback 506.7    Total Intake(mL/kg) 3562.5 (26.7) 252.3 (1.9)   Urine (mL/kg/hr) 6520 (2) 1000 (1)   Other 1000    Total Output 7520 1000   Net -3957.5 -747.7           sodium chloride 10 mL/hr at 09/13/21 0816   sodium chloride     sodium chloride Stopped (09/17/21 1700)   ceFEPime (MAXIPIME) IV     dexmedetomidine (PRECEDEX) IV infusion Stopped (09/18/21 1027)   fentaNYL infusion INTRAVENOUS Stopped (09/18/21 0855)   furosemide (LASIX) 200 mg in dextrose 5% 100 mL (2mg /mL) infusion 8 mg/hr (09/18/21 1209)   lactated ringers 10 mL/hr at 09/18/21 1030   lactated ringers 20 mL/hr at 09/10/21 1517   norepinephrine (LEVOPHED) Adult infusion 2 mcg/min (09/18/21 1030)     Lab Results  Component Value Date   WBC 10.4 09/18/2021   HGB 11.5 (L) 09/18/2021   HCT 36.6 (L) 09/18/2021   PLT 203 09/18/2021   GLUCOSE 245 (H) 09/18/2021   CHOL 134 07/13/2021   TRIG 103.0 07/13/2021   HDL 45.50 07/13/2021   LDLCALC 68 07/13/2021   ALT 14 09/08/2021   AST 61 (H) 09/08/2021   NA 142 09/18/2021   K 3.8 09/18/2021   CL 100 09/18/2021   CREATININE 1.89 (H) 09/18/2021   BUN 85 (H) 09/18/2021   CO2 29 09/18/2021   TSH 1.414 08/27/2019   PSA 1.28 09/14/2020   INR 1.8 (H) 09/18/2021   HGBA1C  6.5 (H) 09/04/2021   MICROALBUR 18.7 (H) 02/08/2019  SR, first degree heart block. On Nor Epinephrine drip at 2 mcg/min Extubated earlier around 12:30 pm. On bi pap with FiO2% 80 CVP 10, on Lasix drip   [name] Zimmerman PA-C 09/18/2021 2:53 PM   Comfotale on bipap Diuresing on lasix  [name] [name] [name] [name]

## 2021-09-19 ENCOUNTER — Inpatient Hospital Stay (HOSPITAL_COMMUNITY): Payer: Medicare Other

## 2021-09-19 DIAGNOSIS — J9601 Acute respiratory failure with hypoxia: Secondary | ICD-10-CM | POA: Diagnosis not present

## 2021-09-19 DIAGNOSIS — J810 Acute pulmonary edema: Secondary | ICD-10-CM | POA: Diagnosis not present

## 2021-09-19 LAB — GLUCOSE, CAPILLARY
Glucose-Capillary: 197 mg/dL — ABNORMAL HIGH (ref 70–99)
Glucose-Capillary: 191 mg/dL — ABNORMAL HIGH (ref 70–99)
Glucose-Capillary: 214 mg/dL — ABNORMAL HIGH (ref 70–99)
Glucose-Capillary: 194 mg/dL — ABNORMAL HIGH (ref 70–99)
Glucose-Capillary: 232 mg/dL — ABNORMAL HIGH (ref 70–99)
Glucose-Capillary: 188 mg/dL — ABNORMAL HIGH (ref 70–99)
Glucose-Capillary: 195 mg/dL — ABNORMAL HIGH (ref 70–99)

## 2021-09-19 LAB — CBC
HCT: 35.6 % — ABNORMAL LOW (ref 39.0–52.0)
Hemoglobin: 11.2 g/dL — ABNORMAL LOW (ref 13.0–17.0)
MCH: 29.5 pg (ref 26.0–34.0)
MCHC: 31.5 g/dL (ref 30.0–36.0)
MCV: 93.7 fL (ref 80.0–100.0)
Platelets: 198 10*3/uL (ref 150–400)
RBC: 3.8 MIL/uL — ABNORMAL LOW (ref 4.22–5.81)
RDW: 15.8 % — ABNORMAL HIGH (ref 11.5–15.5)
WBC: 13.9 10*3/uL — ABNORMAL HIGH (ref 4.0–10.5)
nRBC: 0 % (ref 0.0–0.2)

## 2021-09-19 LAB — TYPE AND SCREEN
ABO/RH(D): A NEG
Antibody Screen: NEGATIVE
Unit division: 0
Unit division: 0
Unit division: 0
Unit division: 0

## 2021-09-19 LAB — BPAM RBC
Blood Product Expiration Date: 202209282359
Blood Product Expiration Date: 202209282359
Blood Product Expiration Date: 202209282359
Blood Product Expiration Date: 202210012359
ISSUE DATE / TIME: 202209081015
ISSUE DATE / TIME: 202209081015
Unit Type and Rh: 600
Unit Type and Rh: 600
Unit Type and Rh: 600
Unit Type and Rh: 600

## 2021-09-19 LAB — POCT I-STAT 7, (LYTES, BLD GAS, ICA,H+H)
Acid-Base Excess: 10 mmol/L — ABNORMAL HIGH (ref 0.0–2.0)
Bicarbonate: 35.5 mmol/L — ABNORMAL HIGH (ref 20.0–28.0)
Calcium, Ion: 1.26 mmol/L (ref 1.15–1.40)
HCT: 32 % — ABNORMAL LOW (ref 39.0–52.0)
Hemoglobin: 10.9 g/dL — ABNORMAL LOW (ref 13.0–17.0)
O2 Saturation: 95 %
Patient temperature: 37.2
Potassium: 4.2 mmol/L (ref 3.5–5.1)
Sodium: 147 mmol/L — ABNORMAL HIGH (ref 135–145)
TCO2: 37 mmol/L — ABNORMAL HIGH (ref 22–32)
pCO2 arterial: 54.3 mmHg — ABNORMAL HIGH (ref 32.0–48.0)
pH, Arterial: 7.425 (ref 7.350–7.450)
pO2, Arterial: 79 mmHg — ABNORMAL LOW (ref 83.0–108.0)

## 2021-09-19 LAB — BASIC METABOLIC PANEL
Anion gap: 12 (ref 5–15)
BUN: 103 mg/dL — ABNORMAL HIGH (ref 8–23)
CO2: 31 mmol/L (ref 22–32)
Calcium: 9.4 mg/dL (ref 8.9–10.3)
Chloride: 102 mmol/L (ref 98–111)
Creatinine, Ser: 2.31 mg/dL — ABNORMAL HIGH (ref 0.61–1.24)
GFR, Estimated: 29 mL/min — ABNORMAL LOW (ref >60)
Glucose, Bld: 213 mg/dL — ABNORMAL HIGH (ref 70–99)
Potassium: 4.2 mmol/L (ref 3.5–5.1)
Sodium: 145 mmol/L (ref 135–145)

## 2021-09-19 LAB — PROTIME-INR
INR: 1.8 — ABNORMAL HIGH (ref 0.8–1.2)
Prothrombin Time: 20.5 seconds — ABNORMAL HIGH (ref 11.4–15.2)

## 2021-09-19 LAB — COOXEMETRY PANEL
Carboxyhemoglobin: 1.3 % (ref 0.5–1.5)
Methemoglobin: 0.9 % (ref 0.0–1.5)
O2 Saturation: 71 %
Total hemoglobin: 11.1 g/dL — ABNORMAL LOW (ref 12.0–16.0)

## 2021-09-19 LAB — MAGNESIUM: Magnesium: 2.3 mg/dL (ref 1.7–2.4)

## 2021-09-19 MED ORDER — MIDODRINE HCL 5 MG PO TABS
5.0000 mg | ORAL_TABLET | Freq: Three times a day (TID) | ORAL | Status: DC
  Administered 2021-09-19 – 2021-09-22 (×10): 5 mg
  Filled 2021-09-19 (×9): qty 1

## 2021-09-19 MED ORDER — WARFARIN SODIUM 5 MG PO TABS
5.0000 mg | ORAL_TABLET | Freq: Once | ORAL | Status: AC
  Administered 2021-09-19: 5 mg via ORAL
  Filled 2021-09-19: qty 1

## 2021-09-19 MED ORDER — INSULIN DETEMIR 100 UNIT/ML ~~LOC~~ SOLN
25.0000 [IU] | Freq: Two times a day (BID) | SUBCUTANEOUS | Status: DC
  Administered 2021-09-19 (×2): 25 [IU] via SUBCUTANEOUS
  Filled 2021-09-19 (×4): qty 0.25

## 2021-09-19 NOTE — Progress Notes (Signed)
13 Days Post-Op Procedure(s) (LRB): AORTIC VALVE REPLACEMENT (AVR) USING INSPIRIS AORTIC VALVE 27 MM (N/A) MAZE (N/A) TRANSESOPHAGEAL ECHOCARDIOGRAM (TEE) (N/A) APPLICATION OF CELL SAVER CLIPPING OF ATRIAL APPENDAGE USING ATRICLIP PRO 240 Subjective:  Has remained on bipap with 100% O2 with sats 91%.   CVP 8, Co-ox 71%.    Objective: Vital signs in last 24 hours: Temp:  [98.4 F (36.9 C)-99.7 F (37.6 C)] 98.8 F (37.1 C) (09/21 0630) Pulse Rate:  [55-101] 97 (09/20 2020) Cardiac Rhythm: Normal sinus rhythm (09/21 0400) Resp:  [17-30] 21 (09/21 0630) BP: (93-153)/(56-86) 107/59 (09/21 0630) SpO2:  [82 %-98 %] 91 % (09/21 0630) Arterial Line BP: (99-158)/(47-75) 103/47 (09/21 0630) FiO2 (%):  [50 %-100 %] 100 % (09/21 0516) Weight:  [131.5 kg] 131.5 kg (09/21 0233)  Hemodynamic parameters for last 24 hours: CVP:  [3 mmHg-14 mmHg] 8 mmHg  Intake/Output from previous day: 09/20 0701 - 09/21 0700 In: 2661.1 [I.V.:647.4; NG/GT:1913.8; IV Piggyback:100] Out: 4980 [Urine:4980] Intake/Output this shift: Total I/O In: 1101.4 [I.V.:294.3; NG/GT:807.1] Out: 2080 [Urine:2080]  General appearance: sedated on low dose Precedex for agitation overnight. Bipap Neurologic: moving all ext strongly but not following commands for me. Heart: regular rate and rhythm Lungs: clear to auscultation bilaterally Abdomen: soft, non-tender; bowel sounds normal Extremities: edema mild in legs Wound: incision healing well.  Lab Results: Recent Labs    09/18/21 0349 09/19/21 0428  WBC 10.4 13.9*  HGB 11.5* 11.2*  HCT 36.6* 35.6*  PLT 203 198   BMET:  Recent Labs    09/18/21 0349 09/19/21 0428  NA 142 145  K 3.8 4.2  CL 100 102  CO2 29 31  GLUCOSE 245* 213*  BUN 85* 103*  CREATININE 1.89* 2.31*  CALCIUM 9.5 9.4    PT/INR:  Recent Labs    09/19/21 0428  LABPROT 20.5*  INR 1.8*   ABG    Component Value Date/Time   PHART 7.462 (H) 09/17/2021 1545   HCO3 30.2 (H)  09/17/2021 1545   TCO2 31 09/17/2021 1545   ACIDBASEDEF 1.0 09/12/2021 0322   O2SAT 71.0 09/19/2021 0445   CBG (last 3)  Recent Labs    09/18/21 2018 09/19/21 0104 09/19/21 0434  GLUCAP 202* 197* 191*    Assessment/Plan: S/P Procedure(s) (LRB): AORTIC VALVE REPLACEMENT (AVR) USING INSPIRIS AORTIC VALVE 27 MM (N/A) MAZE (N/A) TRANSESOPHAGEAL ECHOCARDIOGRAM (TEE) (N/A) APPLICATION OF CELL SAVER CLIPPING OF ATRIAL APPENDAGE USING ATRICLIP PRO 240  POD 13  He has been hemodynamically stable in sinus rhythm off IV pressors but still on midodrine. Co-ox good.  Respiratory status still tenuous on 100% bipap.  -2300 cc on lasix drip yesterday and wt is 10 lbs under preop. Although he still has some edema he is probably getting too dried out with BUN up to 103 and creat 2.31. BUN is going to start affecting his mental status. Will stop lasix for now.  Continue tube feeds at 65/hr.  Glucose still about 200. Increase Levemir to 25 bid.  Coumadin per pharmacy for AF.   LOS: 13 days    [name] 09/19/2021

## 2021-09-19 NOTE — Progress Notes (Addendum)
[address]       [place], [place] [address]             [phone]      13 Days Post-Op  Procedure(s) (LRB): AORTIC VALVE REPLACEMENT (AVR) USING INSPIRIS AORTIC VALVE 27 MM (N/A) MAZE (N/A) TRANSESOPHAGEAL ECHOCARDIOGRAM (TEE) (N/A) APPLICATION OF CELL SAVER CLIPPING OF ATRIAL APPENDAGE USING ATRICLIP PRO 240   Total Length of Stay:  LOS: 13 days    SUBJECTIVE: Lightly sedated with Precedex. Patient's wife at the bedside reports he seems more focused and is attempting to communicate today.  Vitals:   09/19/21 1400 09/19/21 1517  BP: 114/63 (!) 149/73  Pulse:  69  Resp: (!) 21 (!) 24  Temp: 99.5 F (37.5 C)   SpO2: 95% 96%    Intake/Output      09/20 0701 09/21 0700 09/21 0701 09/22 0700   I.V. (mL/kg) 647.4 (4.9) 154.6 (1.2)   NG/GT 1913.8    IV Piggyback 100    Total Intake(mL/kg) 2661.1 (20.2) 154.6 (1.2)   Urine (mL/kg/hr) 5105 (1.6) 600 (0.5)   Emesis/NG output 0    Other     Stool 0    Total Output 5105 600   Net -2443.9 -445.4        Stool Occurrence 1 x        sodium chloride 10 mL/hr at 09/13/21 0816   sodium chloride     sodium chloride Stopped (09/17/21 1700)   dexmedetomidine (PRECEDEX) IV infusion 0.4 mcg/kg/hr (09/19/21 1253)   fentaNYL infusion INTRAVENOUS Stopped (09/19/21 0204)   lactated ringers 10 mL/hr at 09/19/21 1200   lactated ringers 20 mL/hr at 09/10/21 1517   norepinephrine (LEVOPHED) Adult infusion Stopped (09/18/21 1121)    CBC    Component Value Date/Time   WBC 13.9 (H) 09/19/2021 0428   RBC 3.80 (L) 09/19/2021 0428   HGB 10.9 (L) 09/19/2021 0903   HGB 13.7 07/12/2021 0823   HGB 16.3 03/12/2011 0948   HCT 32.0 (L) 09/19/2021 0903   HCT 42.5 07/12/2021 0823   HCT 46.6 03/12/2011 0948   PLT 198 09/19/2021 0428   PLT 179 07/12/2021 0823   MCV 93.7 09/19/2021 0428   MCV 90 07/12/2021 0823   MCV 90.9 03/12/2011 0948   MCH 29.5 09/19/2021 0428   MCHC 31.5 09/19/2021 0428   RDW 15.8 (H)  09/19/2021 0428   RDW 13.1 07/12/2021 0823   RDW 13.1 03/12/2011 0948   LYMPHSABS 1.1 09/14/2021 0311   LYMPHSABS 3.4 (H) 12/25/2017 0937   LYMPHSABS 2.7 03/12/2011 0948   MONOABS 1.1 (H) 09/14/2021 0311   MONOABS 0.6 03/12/2011 0948   EOSABS 0.7 (H) 09/14/2021 0311   EOSABS 0.6 (H) 12/25/2017 0937   BASOSABS 0.0 09/14/2021 0311   BASOSABS 0.0 12/25/2017 0937   BASOSABS 0.0 03/12/2011 0948   CMP     Component Value Date/Time   NA 147 (H) 09/19/2021 0903   NA 138 07/19/2021 1407   K 4.2 09/19/2021 0903   CL 102 09/19/2021 0428   CO2 31 09/19/2021 0428   GLUCOSE 213 (H) 09/19/2021 0428   BUN 103 (H) 09/19/2021 0428   BUN 30 (H) 07/19/2021 1407   CREATININE 2.31 (H) 09/19/2021 0428   CREATININE 1.21 (H) 09/14/2020 0754   CALCIUM 9.4 09/19/2021 0428   PROT 4.9 (L) 09/08/2021 0346   PROT 7.0 04/30/2021 1013   ALBUMIN 3.0 (L) 09/08/2021 0346   ALBUMIN 4.5 04/30/2021 1013   AST 61 (  H) 09/08/2021 0346   ALT 14 09/08/2021 0346   ALKPHOS 44 09/08/2021 0346   BILITOT 1.0 09/08/2021 0346   BILITOT 1.0 04/30/2021 1013   GFRNONAA 29 (L) 09/19/2021 0428   GFRNONAA 60 09/14/2020 0754   GFRAA 64 12/04/2020 1444   GFRAA 70 09/14/2020 0754   ABG    Component Value Date/Time   PHART 7.425 09/19/2021 0903   PCO2ART 54.3 (H) 09/19/2021 0903   PO2ART 79 (L) 09/19/2021 0903   HCO3 35.5 (H) 09/19/2021 0903   TCO2 37 (H) 09/19/2021 0903   ACIDBASEDEF 1.0 09/12/2021 0322   O2SAT 95.0 09/19/2021 0903   CBG (last 3)  Recent Labs    09/19/21 0434 09/19/21 0810 09/19/21 1132  GLUCAP 191* 214* 194*     ASSESSMENT: Marginal respiratory status.  PCO2 54 this morning on BiPAP 100% FiO2.  PEEP increased on BiPAP today to 12 with improved oxygen saturation.  Blood pressure stable off IV pressors.  He is maintaining sinus rhythm.   [name], PA-C  On BIPAP with low dose precedex    Revonda Standard. Roxan Hockey, MD [facility] [phone]

## 2021-09-19 NOTE — Progress Notes (Signed)
NAME:  [name] Rosol., MRN:  [id number], DOB:  1951-04-02, LOS: 90 ADMISSION DATE:  09/20/2021, CONSULTATION DATE:  09/07/21 REFERRING MD:  Dr. [name], CHIEF COMPLAINT:  Resp failure   History of Present Illness:  HPI obtained from medical chart review as patient is intubated and sedated on mechanical ventilation.   70 year old male with prior history of prior tobacco abuse (quit 2010), centrilobular emphysema, chronic hypoxic respiratory failure on home O2 (?4L [place]), pulmonary hypertension, OSA on CPAP, PAF, CAD w/ LAD stent 2003, HLD, MSSA sepsis c/b lumbar discitis and infected left sternoclavicular joint 08/2019 who recently had worsening exertional dyspnea, orthopnea, and fatigue.  Had TTE in 04/2021 which showed new moderate to severe AI with normal EF 55-60 and new dilation of LV cavity with trivial MR and mild TR, normal RV.  Subsequently, underwent TEE on 07/10/21 which shoed EF 55-60%, mild dilation of ascending aorta, and severe AI.  Underwent [facility]  07/16/21 which showed patent LAD stent, mild diffuse nonobstructive CAD, moderate pulmonary hypertension with PA 67/20 (39) and a pulmonary wedge pressure of 38mHg.  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  Found to have AV vegetations which where sent for culture.  EBL during surgery 3025 ml with 1870 ml in blood products/ cell saver.  Patient did have some trouble transitioning off CPB with high PA pressures with struggling systemic pressures and RV, therefore ended up on NO and vasopressor support with milrinone.  He remains sedated and intubated with tenuous hemodynamics today.  PCCM consulted to help with vent management.  HF also being consulted to help with pulmonary hypertension.    Of note, patient is followed in our pulmonary office by Dr. SHalford Chessman last seen 08/10/21 by BDerl Barrow NP.  Previous CTA 6/22 neg for PE, noted underlying emphysematous changes with areas  of fibrosis and scarring, nodular opacity in RML (smaller now than previous imaging).  Reported compliance with CPAP and anoro.  PFT 08/09/21- FVC 3.42 (75%), FEV1 2. 60 (77%), ratio 76, DLCOunc 10/29 (38%).  Uses O2 2L at rest, and 3-4L with exertions for goal sats > 88-90%.  Pertinent  Medical History  Former Tobacco abuse (quit 2010), centrilobular emphysema, pulmonary hypertension, OSA on CPAP, PAF, CAD w/ LAD stent 2003, HLD, MSSA sepsis c/b lumbar discitis and infected left sternoclavicular joint 08/2019   Significant Hospital Events: Including procedures, antibiotic start and stop dates in addition to other pertinent events   9/8 aortic valve replacement with bioprosthetic valve and biatrial Maze procedure 9/8 ETT >> 9/8 foley >> 9/8 R IJ cordis with PA cath >> 9/8 L radial Aline >> 9/8 L femoral aline (placed in OR given variable L radial tracings)>> 9/8 parasternal pacing wires >> 9/8 mediastinal CT x 2 >>  9/8 aortic tissue cx >> 9/8 aortic tissue AFB >> 9/8 aortic fungal >> 9/9 Bcx 2 >>  9/8 ancef >>9/9 9/8 vanc >> 9/9 cefepime >> 9/15: L lung white out req bronch  9/17: off nitric, lasix ggt, self extubated, reintubated 9/20: extubated to BIPAP and diuresis  Interim History / Subjective:  Remains tenuous on BIPAP and precedex.  Objective   Blood pressure 125/64, pulse 97, temperature 99 F (37.2 C), temperature source Bladder, resp. rate (!) 25, height _0  (1.854 m), weight 131.5 kg, SpO2 93 %. CVP:  [3 mmHg-14 mmHg] 12 mmHg  Vent Mode: BIPAP;PCV FiO2 (%):  [50 %-100 %] 100 % Set  Rate:  [12 bmp] 12 bmp PEEP:  [7 [name]20-8 [name]20] 8 [name]20 Pressure Support:  [5 [name]20] 5 [name]20   Intake/Output Summary (Last 24 hours) at 09/19/2021 0830 Last data filed at 09/19/2021 1700 Gross per 24 hour  Intake 2661.12 ml  Output 5105 ml  Net -2443.88 ml    Filed Weights   09/17/21 0400 09/18/21 0347 09/19/21 0233  Weight: 135.9 kg 133.6 kg 131.5 kg    Examination: Ill  appearing man on BIPAP Will not follow commands Lungs with basilar crackles, no wheezing Heart sounds regular, ext warm Midline incision well healing Abdomen soft, +BS  Coox 71% BUN/Cr up WBC slightly up   Resolved Hospital Problem list   Thrombocytopenia- improving Ileus vs constipation Question HCAP, BAL w/ normal flora; secretion burden improved with abx, s/p 5 days HCAP tx ended 9/21 Question parapneumonic effusion- drained 9/19; either post AVR inflammatory or parapneumonic, cultures pending Assessment & Plan:   Acute on chronic hypoxic respiratory failure (on home 4L O2 at home) OSA, on home CPAP.  Baseline poor pulmonary reserve making weaning difficult.  He still appears wet on CXR and CVP.   Started to get limited by diuresis. 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, PVR preserved on cath in July AI s/p AVR and MAZE procedure Hx PAF, CAD with LAD stent AKI on CKD- going to give a break on diuretics today Class 3 obesity DM2  P: Overall he does not look great, I think if he gets reintubated he will need a tracheostomy; would like to try to limp along and see if we can somehow avoid this  Continue BIPAP and precedex, check CXR/ABG  Lasix drip off  Basal insulin increased  Best Practice (right click and "Reselect all SmartList Selections" daily)   Diet/type: tubefeeds DVT prophylaxis: warfarin GI prophylaxis: PPI Lines: yes and it is still needed Foley:  Yes, and it is still needed Code Status:  full code Last date of multidisciplinary goals of care discussion [wife updated at bedside on 9/21]  Patient critically ill due to respiratory failure Interventions to address this today BIPAP titration, intubation watch Risk of deterioration without these interventions is high  I personally spent 33 minutes providing critical care not including any separately billable procedures  Erskine Emery  MD [facility]  Prefer epic messenger for cross cover needs If after hours, please call E-link

## 2021-09-19 NOTE — Progress Notes (Addendum)
Patient ID: [name] [name]., male   DOB: 07/19/1951, 70 y.o.   MRN: [id number]     Advanced Heart Failure Rounding Note  PCP-Cardiologist: [name], MD   Subjective:    9/11: Echo EF 40-45% RV ok. RVSP 22.8  9/14 - 9/15 Developed hypoxia, CXR with left lung white-out, underwent bronchoscopy with treatment of mucus plug. Placed on norepi overnight.  9/16: iNO stopped.  9/17: Self-extubated, had to be reintubated.  9/19: Left thoracentesis 9/20: Extubated to BiPAP   Remains on BiPAP, FiO2 100%. PEEP 8.   Off milrinone and NE. Co-ox stable 71%   Maintaining NSR.   5 L in UOP yesterday. Wt down 5 lb. Worsening AKI SCr 1.73>>1.89>>2.31. BUN 103.  K stable 4.2   CVP 12   He had soft BPs overnight, MAP in the 50s. Lasix gtt discontinued. Sedation also wean. MAPs improved, currently 70s w/o NE.   Sleeping. Remains on precedex. + bilateral UE tremor     Objective:   Weight Range: 131.5 kg Body mass index is 38.25 kg/m.   Vital Signs:   Temp:  [98.4 F (36.9 C)-99.7 F (37.6 C)] 98.8 F (37.1 C) (09/21 0645) Pulse Rate:  [55-101] 97 (09/20 2020) Resp:  [17-30] 25 (09/21 0645) BP: (93-153)/(56-86) 107/59 (09/21 0630) SpO2:  [82 %-98 %] 89 % (09/21 0645) Arterial Line BP: (99-158)/(47-75) 122/58 (09/21 0645) FiO2 (%):  [50 %-100 %] 100 % (09/21 0516) Weight:  [131.5 kg] 131.5 kg (09/21 0233) Last BM Date: 09/18/21  Weight change: Filed Weights   09/17/21 0400 09/18/21 0347 09/19/21 0233  Weight: 135.9 kg 133.6 kg 131.5 kg    Intake/Output:   Intake/Output Summary (Last 24 hours) at 09/19/2021 0734 Last data filed at 09/19/2021 0653 Gross per 24 hour  Intake 2661.12 ml  Output 5105 ml  Net -2443.88 ml      Physical Exam   CVP 12  General:  obese WM, extubated on BiPAP. No distress  HEENT: normal Neck: supple. JVD ~12 cm. Carotids 2+ bilat; no bruits. No lymphadenopathy or thyromegaly appreciated. Cor: PMI nondisplaced. Regular rate & rhythm. 2/6  SEM RUSB Sternotomy site ok  Lungs: decreased BS at the bases bilaterally  Abdomen: soft, nontender, nondistended. No hepatosplenomegaly. No bruits or masses. Good bowel sounds. Extremities: no cyanosis, clubbing, rash, 1+ bilateral LE edema Neuro: sedated, + bilateral UE tremor   Telemetry   NSR 70s (personally reviewed)   EKG    N/A  Labs    CBC Recent Labs    09/18/21 0349 09/19/21 0428  WBC 10.4 13.9*  HGB 11.5* 11.2*  HCT 36.6* 35.6*  MCV 93.6 93.7  PLT 203 332   Basic Metabolic Panel Recent Labs    09/18/21 0349 09/19/21 0428  NA 142 145  K 3.8 4.2  CL 100 102  CO2 29 31  GLUCOSE 245* 213*  BUN 85* 103*  CREATININE 1.89* 2.31*  CALCIUM 9.5 9.4  MG 2.1 2.3   Liver Function Tests No results for input(s): AST, ALT, ALKPHOS, BILITOT, PROT, ALBUMIN in the last 72 hours.  No results for input(s): LIPASE, AMYLASE in the last 72 hours. Cardiac Enzymes No results for input(s): CKTOTAL, CKMB, CKMBINDEX, TROPONINI in the last 72 hours.  BNP: BNP (last 3 results) No results for input(s): BNP in the last 8760 hours.  ProBNP (last 3 results) No results for input(s): PROBNP in the last 8760 hours.   D-Dimer No results for input(s): DDIMER in the last 72 hours. Hemoglobin  A1C No results for input(s): HGBA1C in the last 72 hours. Fasting Lipid Panel No results for input(s): CHOL, HDL, LDLCALC, TRIG, CHOLHDL, LDLDIRECT in the last 72 hours. Thyroid Function Tests No results for input(s): TSH, T4TOTAL, T3FREE, THYROIDAB in the last 72 hours.  Invalid input(s): FREET3  Other results:   Imaging    No results found.   Medications:     Scheduled Medications:  amiodarone  200 mg Per Tube BID   arformoterol  15 mcg Nebulization BID   aspirin  81 mg Per Tube Daily   atorvastatin  80 mg Per Tube Daily   bisacodyl  10 mg Rectal Q0600   brimonidine  1 drop Both Eyes TID   busPIRone  15 mg Per Tube BID   chlorhexidine gluconate (MEDLINE KIT)  15 mL  Mouth Rinse BID   Chlorhexidine Gluconate Cloth  6 each Topical Daily   clonazePAM  1 mg Per Tube TID   docusate  200 mg Per Tube Daily   feeding supplement (PROSource TF)  45 mL Per Tube BID   feeding supplement (VITAL 1.5 CAL)  1,000 mL Per Tube Q24H   fluticasone  2 spray Each Nare Daily   insulin aspart  0-20 Units Subcutaneous Q4H   insulin detemir  25 Units Subcutaneous BID   mouth rinse  15 mL Mouth Rinse q12n4p   midodrine  15 mg Per Tube TID WC   pantoprazole sodium  40 mg Per Tube Daily   PARoxetine  60 mg Per Tube Daily   polyethylene glycol  17 g Per Tube Daily   revefenacin  175 mcg Nebulization Daily   senna  2 tablet Per Tube QHS   sodium chloride flush  3 mL Intravenous Q12H   Warfarin - Pharmacist Dosing Inpatient   Does not apply q1600    Infusions:  sodium chloride 10 mL/hr at 09/13/21 0816   sodium chloride     sodium chloride Stopped (09/17/21 1700)   dexmedetomidine (PRECEDEX) IV infusion 0.4 mcg/kg/hr (09/19/21 0600)   fentaNYL infusion INTRAVENOUS Stopped (09/19/21 0204)   lactated ringers 10 mL/hr at 09/19/21 0600   lactated ringers 20 mL/hr at 09/10/21 1517   norepinephrine (LEVOPHED) Adult infusion Stopped (09/18/21 1121)    PRN Medications: sodium chloride, dextrose, fentaNYL, levalbuterol, morphine injection, ondansetron (ZOFRAN) IV, sodium chloride flush    Patient Profile   70 y.o. with history of COPD/smoking, chronic severe aortic insufficiency, paroxysmal atrial fibrillation, CAD, type 2 diabetes, and CKD stage 3.  Patient had PCI to LAD in 12/03, LHC in 7/22 showed only mild CAD.  He has paroxysmal atrial fibrillation and had been maintained on Tikosyn.  He had not been ablated due to obesity.  Patient had lumbar discitis and left sternoclavicular joint infection in 9/20, had MSSA.  He had I&D left [place] joint and IV abx.  Echo at the time showed no vegetation and trivial AI.    HF team consulted for shock/pulmonary hypertension.    Assessment/Plan   1. Aortic insufficiency: TEE pre-op with severe AI, tricuspid aortic valve.  The LV was moderately dilated.  Now s/p bioprosthetic AVR.  The native valve was perforated with possible vegetation.  Suspect he did not have acute endocarditis, tissue cultures remain negative.  Rheumatological serologic workup negative.  - continue supportive care 2. Shock: Post-op.  Suspect mixed picture with RV dysfunction/severe pulmonary hypertension as well as likely distributive/septic component with low SVR.  Remains afebrile.  Now off NE and milrinone. Co-ox stable 71%.  -  Continue midodrine 15 mg tid.   - Vanc/cefepime restarted 9/16 for HCAP (5 days coverage).  3.  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  Post-op echo with LV down 40-45% (post-correction of severe AI), normal RV, normal bioprosthetic aortic valve.  Good diuresis with Lasix gtt. Weight down additional 5 lbs and below pre-op though CVP still 11-12.  Creatinine 2.31, higher.  - hold diuretics today  4.  Pulmonary hypertension: Suspect mixed group 2/3 pulmonary hypertension + high output.  Has significant lung parenchymal disease, CTA chest in 6/22 with emphysema as well as areas of fibrosis, as well as OSA and ?OHS.  Pre-op was on CPAP and 4L home oxygen.  Initially, peri-operative PA pressure was very high, in the 90/45 range. Now off iNO. PA pressures were significantly lower when Swan still in place and PVR was low.  5. Acute on chronic hypoxemic respiratory failure: Intubated post-op.  Baseline COPD + OSA + ?OHS and on home oxygen 4 liters. Now with post-op volume overload as well. 9/15 mucus plug with left lung white-out, S/P Bronch with removal of plug - sputum cx pending. Repeat CXR post-bronch significantly improved.  Self-extubated 9/17, had to be reintubated. 9/19 had  thoracentesis on left.  Extubated to BiPAP 9/20. Remains on BiPAP  - Brovana and Yupelri ordered  - Hold diuretics per above - Completed abx.  6. ID: Cultures including tissue culture from valve so far negative.  9/20 MSSA discitis.  ID following. Concern for possible distributive shock component. Blood cultures from 9/9 with NGTD. WBC normal, afebrile. PCT 0.33.  - completed 5 days course of vancomycin/cefepime for HCAP.  7. AKI on CKD stage 3: Creatinine higher at 1.89>>2.31. BUN 103 (+ tremor). Nonoliguric  - hold diuretics - continue midodrine for BP support/ renal perfusion, keep MAPs ~70  - monitor for uremic symptoms (+tremor), ?need for CVVH  8. Atrial fibrillation: Paroxysmal.  On Tikosyn pre-op.  Had Maze procedure. In AF currently. Now back in NSR.   - Continue PO Amiodarone  - Continue warfarin. INR 1.8   Lyda Jester, PA-C  09/19/2021 7:34 AM  Patient seen with PA, agree with the above note.   Creatinine higher today.  CVP around 11-12.  Co-ox 71%.  He is now off NE and milrinone, extubated to Bipap.    He remains in NSR.   General: Sitting up on Bipap Neck: JVP difficult, no thyromegaly or thyroid nodule.  Lungs: Decreased at bases.  CV: Nondisplaced PMI.  Heart regular S1/S2, no S3/S4, no murmur. 1+ ankle edema.   Abdomen: Soft, nontender, no hepatosplenomegaly, no distention.  Skin: Intact without lesions or rashes.  Neurologic: Alert and oriented x 3.  Psych: Normal affect. Extremities: No clubbing or cyanosis.  HEENT: Normal.   Patient is in NSR today on amiodarone po and warfarin.   Creatinine higher at 2.3 with rising BUN.  CVP 11-12.  Suspect he has some underlying RV dysfunction with pulmonary hypertension, probably needs to run a CVP in the 10-12 range.   - Hold Lasix today.  - Reassess renal function and volume status tomorrow.   Loralie Champagne 09/19/2021 4:08 PM

## 2021-09-19 NOTE — Progress Notes (Signed)
[name] [name] for warfarin Indication:  bioprosthetic aortic valve replacement and a fib  Allergies  Allergen Reactions   Pseudoephedrine Other (See Comments)    Patient went into afib   Diltiazem Rash and Itching    Also 2019   Novocain [Procaine] Hives    Dentist appointment in 1958; since then has tolerated lidocaine and provocaine with no hives or difficulty.   Quinolones     Patient was warned about not using Cipro and similar antibiotics. Recent studies have raised concern that fluoroquinolone antibiotics could be associated with an increased risk of aortic aneurysm Fluoroquinolones have non-antimicrobial properties that might jeopardise the integrity of the extracellular matrix of the vascular wall In a  propensity score matched cohort study in [place], there was a 66% increased rate of aortic aneurysm or dissection associated with oral fluoroquinolone use, compared wit   Sulfonamide Derivatives Other (See Comments)    Childhood reaction     Patient Measurements: Height: 6' 1"  (185.4 cm) Weight: 131.5 kg (289 lb 14.5 oz) IBW/kg (Calculated) : 79.9  Vital Signs: Temp: 99 F (37.2 C) (09/21 0700) Temp Source: Bladder (09/21 0700) BP: 125/64 (09/21 0700)  Labs: Recent Labs    09/17/21 0401 09/17/21 0428 09/18/21 0349 09/19/21 0428 09/19/21 0903  HGB 11.4*   < > 11.5* 11.2* 10.9*  HCT 36.2*   < > 36.6* 35.6* 32.0*  PLT 193  --  203 198  --   LABPROT 22.3*  --  20.9* 20.5*  --   INR 2.0*  --  1.8* 1.8*  --   CREATININE 1.73*  --  1.89* 2.31*  --    < > = values in this interval not displayed.     Estimated Creatinine Clearance: 41.7 mL/min (A) (by C-G formula based on SCr of 2.31 mg/dL (H)).   Medical History: Past Medical History:  Diagnosis Date   Anxiety    Atrial fibrillation ([place]) 03/22/2009   a. s/p multiple DCCV; b. no coumadin due to low TE risk profile; c. Tikosyn Rx   COPD (chronic obstructive pulmonary disease)  (HCC)    Coronary atherosclerosis of native coronary artery 11/2002   a. s/p stent to LAD 12/03; OM2 occluded at cath 12/03; d. myoview 5/10: no ischemia;  e. echo 7/11: EF 55%, BAE, mild RVE, PASP 41-45; Myoview was in March 2013. There was no ischemia or infarction, EF 51%    Cutaneous abscess of back excluding buttocks 07/04/2014   Appears to stem from possibly a cyst very large area 6 cm contact surgeon office    Diabetes mellitus without complication ([place])    Drusen body    see opth note   Dyspnea    Dysrhythmia    Epiglottitis    w emergency nt intubation   ERECTILE DYSFUNCTION 03/22/2009   GERD 03/22/2009   Heart murmur    HYPERGLYCEMIA 04/25/2010   HYPERLIPIDEMIA 03/22/2009   Hypertension    Iliac aneurysm ([place])    2.6 to be evaluated incidental finding on CT   LATERAL EPICONDYLITIS, LEFT 10/24/2009   LIVER FUNCTION TESTS, ABNORMAL 04/25/2010   Local reaction to immunization 05/05/2012   minor resolving  zostavax    Myocardial infarction [facility]) mi2003   Numbness in left leg    foot related to back disease and surgery   Obesity, unspecified 04/24/2009   Perforated appendicitis with necrosis s/p open appendectomy 06/07/14 06/04/2014   Renal cyst    Characterized by MRI as simple   Ruptured  suppurative appendicitis    2015    SLEEP APNEA, OBSTRUCTIVE 03/22/2009   compliant with CPAP   THROMBOCYTOPENIA 08/16/2010   TOBACCO USE, QUIT 10/24/2009   ULNAR NEUROPATHY, LEFT 92/10/9416   Umbilical hernia     Medications:  Scheduled:   amiodarone  200 mg Per Tube BID   arformoterol  15 mcg Nebulization BID   aspirin  81 mg Per Tube Daily   atorvastatin  80 mg Per Tube Daily   bisacodyl  10 mg Rectal Q0600   brimonidine  1 drop Both Eyes TID   busPIRone  15 mg Per Tube BID   chlorhexidine gluconate (MEDLINE KIT)  15 mL Mouth Rinse BID   Chlorhexidine Gluconate Cloth  6 each Topical Daily   clonazePAM  1 mg Per Tube TID   docusate  200 mg Per Tube Daily   feeding  supplement (PROSource TF)  45 mL Per Tube BID   feeding supplement (VITAL 1.5 CAL)  1,000 mL Per Tube Q24H   fluticasone  2 spray Each Nare Daily   insulin aspart  0-20 Units Subcutaneous Q4H   insulin detemir  25 Units Subcutaneous BID   mouth rinse  15 mL Mouth Rinse q12n4p   midodrine  15 mg Per Tube TID WC   pantoprazole sodium  40 mg Per Tube Daily   PARoxetine  60 mg Per Tube Daily   polyethylene glycol  17 g Per Tube Daily   revefenacin  175 mcg Nebulization Daily   senna  2 tablet Per Tube QHS   sodium chloride flush  3 mL Intravenous Q12H   Warfarin - Pharmacist Dosing Inpatient   Does not apply q1600    Assessment: 70 yo M s/p bioprosthetic AVR, atrial clip, and MAZE procedure on 9/8. Patient has a PMH of a fib on apixaban PTA and has been in a fib s/p MAZE procedure currently on an amio drip.   INR subtherapeutic 1.8  today, CBC remains stable, given subtherapeutic INR yesterday at 1.8 will given another 5 mg tonight. ASA dropped to 81 mg so will need to get INR therapeutic.   Goal of Therapy:  INR 2-3 Monitor platelets by anticoagulation protocol: Yes   Plan:  Warfarin 5 mg x1 tonight Daily INR  Cathrine Muster, PharmD PGY2 Cardiology Pharmacy Resident Phone: [phone] 09/19/2021  9:07 AM  Please check [email or web address] for unit-specific pharmacy phone numbers.

## 2021-09-20 ENCOUNTER — Inpatient Hospital Stay (HOSPITAL_COMMUNITY): Payer: Medicare Other

## 2021-09-20 ENCOUNTER — Other Ambulatory Visit: Payer: Self-pay | Admitting: Internal Medicine

## 2021-09-20 DIAGNOSIS — J9601 Acute respiratory failure with hypoxia: Secondary | ICD-10-CM | POA: Diagnosis not present

## 2021-09-20 LAB — POCT I-STAT 7, (LYTES, BLD GAS, ICA,H+H)
Acid-Base Excess: 10 mmol/L — ABNORMAL HIGH (ref 0.0–2.0)
Bicarbonate: 36.4 mmol/L — ABNORMAL HIGH (ref 20.0–28.0)
Calcium, Ion: 1.24 mmol/L (ref 1.15–1.40)
HCT: 30 % — ABNORMAL LOW (ref 39.0–52.0)
Hemoglobin: 10.2 g/dL — ABNORMAL LOW (ref 13.0–17.0)
O2 Saturation: 99 %
Patient temperature: 36.8
Potassium: 4.1 mmol/L (ref 3.5–5.1)
Sodium: 150 mmol/L — ABNORMAL HIGH (ref 135–145)
TCO2: 38 mmol/L — ABNORMAL HIGH (ref 22–32)
pCO2 arterial: 58.5 mmHg — ABNORMAL HIGH (ref 32.0–48.0)
pH, Arterial: 7.401 (ref 7.350–7.450)
pO2, Arterial: 122 mmHg — ABNORMAL HIGH (ref 83.0–108.0)

## 2021-09-20 LAB — BASIC METABOLIC PANEL
Anion gap: 9 (ref 5–15)
BUN: 119 mg/dL — ABNORMAL HIGH (ref 8–23)
CO2: 34 mmol/L — ABNORMAL HIGH (ref 22–32)
Calcium: 9.2 mg/dL (ref 8.9–10.3)
Chloride: 105 mmol/L (ref 98–111)
Creatinine, Ser: 2.23 mg/dL — ABNORMAL HIGH (ref 0.61–1.24)
GFR, Estimated: 31 mL/min — ABNORMAL LOW (ref >60)
Glucose, Bld: 222 mg/dL — ABNORMAL HIGH (ref 70–99)
Potassium: 4.3 mmol/L (ref 3.5–5.1)
Sodium: 148 mmol/L — ABNORMAL HIGH (ref 135–145)

## 2021-09-20 LAB — MAGNESIUM: Magnesium: 2.7 mg/dL — ABNORMAL HIGH (ref 1.7–2.4)

## 2021-09-20 LAB — BODY FLUID CULTURE W GRAM STAIN: Culture: NO GROWTH

## 2021-09-20 LAB — CBC
HCT: 33.5 % — ABNORMAL LOW (ref 39.0–52.0)
Hemoglobin: 10.3 g/dL — ABNORMAL LOW (ref 13.0–17.0)
MCH: 29.4 pg (ref 26.0–34.0)
MCHC: 30.7 g/dL (ref 30.0–36.0)
MCV: 95.7 fL (ref 80.0–100.0)
Platelets: 200 10*3/uL (ref 150–400)
RBC: 3.5 MIL/uL — ABNORMAL LOW (ref 4.22–5.81)
RDW: 15.6 % — ABNORMAL HIGH (ref 11.5–15.5)
WBC: 11.7 10*3/uL — ABNORMAL HIGH (ref 4.0–10.5)
nRBC: 0 % (ref 0.0–0.2)

## 2021-09-20 LAB — GLUCOSE, CAPILLARY
Glucose-Capillary: 152 mg/dL — ABNORMAL HIGH (ref 70–99)
Glucose-Capillary: 182 mg/dL — ABNORMAL HIGH (ref 70–99)
Glucose-Capillary: 184 mg/dL — ABNORMAL HIGH (ref 70–99)
Glucose-Capillary: 192 mg/dL — ABNORMAL HIGH (ref 70–99)
Glucose-Capillary: 205 mg/dL — ABNORMAL HIGH (ref 70–99)
Glucose-Capillary: 213 mg/dL — ABNORMAL HIGH (ref 70–99)

## 2021-09-20 LAB — COOXEMETRY PANEL
Carboxyhemoglobin: 0.9 % (ref 0.5–1.5)
Methemoglobin: 0.9 % (ref 0.0–1.5)
O2 Saturation: 78.3 %
Total hemoglobin: 11.6 g/dL — ABNORMAL LOW (ref 12.0–16.0)

## 2021-09-20 LAB — PROTIME-INR
INR: 1.8 — ABNORMAL HIGH (ref 0.8–1.2)
Prothrombin Time: 21.2 seconds — ABNORMAL HIGH (ref 11.4–15.2)

## 2021-09-20 MED ORDER — INSULIN DETEMIR 100 UNIT/ML ~~LOC~~ SOLN
30.0000 [IU] | Freq: Two times a day (BID) | SUBCUTANEOUS | Status: DC
  Administered 2021-09-20 (×2): 30 [IU] via SUBCUTANEOUS
  Filled 2021-09-20 (×4): qty 0.3

## 2021-09-20 MED ORDER — WARFARIN SODIUM 3 MG PO TABS
6.0000 mg | ORAL_TABLET | Freq: Once | ORAL | Status: AC
  Administered 2021-09-20: 6 mg via ORAL
  Filled 2021-09-20: qty 2

## 2021-09-20 MED ORDER — FREE WATER
200.0000 mL | Freq: Four times a day (QID) | Status: DC
  Administered 2021-09-20 – 2021-09-22 (×9): 200 mL

## 2021-09-20 NOTE — Plan of Care (Addendum)
Pt responding well to HFNC, 100% and 40L, sats of 88 to 92%, pt voice is more clear, pt continuing to have productive cough, resp rate remains high near 20-22/min but pt's mood is compliant and enthusiastic to improve.  Problem: Education: Goal: Knowledge of General Education information will improve Description: Including pain rating scale, medication(s)/side effects and non-pharmacologic comfort measures Outcome: Progressing   Problem: Health Behavior/Discharge Planning: Goal: Ability to manage health-related needs will improve Outcome: Progressing   Problem: Clinical Measurements: Goal: Ability to maintain clinical measurements within normal limits will improve Outcome: Progressing Goal: Will remain free from infection Outcome: Progressing Goal: Diagnostic test results will improve Outcome: Progressing Goal: Respiratory complications will improve Outcome: Progressing Goal: Cardiovascular complication will be avoided Outcome: Progressing   Problem: Nutrition: Goal: Adequate nutrition will be maintained Outcome: Progressing   Problem: Coping: Goal: Level of anxiety will decrease Outcome: Progressing   Problem: Elimination: Goal: Will not experience complications related to bowel motility Outcome: Progressing Goal: Will not experience complications related to urinary retention Outcome: Progressing   Problem: Pain Managment: Goal: General experience of comfort will improve Outcome: Progressing

## 2021-09-20 NOTE — Progress Notes (Signed)
Pt sats decreasing to 80-84%, coughing, chest PT, and encouragement unsuccessful in getting pt to increase sats. RT called and at the bedside.  NT suction performed, and pt placed on Bipap, VSS, sats now 98%, pt resting comfortably  with eyes closed.

## 2021-09-20 NOTE — Progress Notes (Signed)
[place] for warfarin Indication:  bioprosthetic aortic valve replacement and a fib  Allergies  Allergen Reactions   Pseudoephedrine Other (See Comments)    Patient went into afib   Diltiazem Rash and Itching    Also 2019   Novocain [Procaine] Hives    Dentist appointment in 1958; since then has tolerated lidocaine and provocaine with no hives or difficulty.   Quinolones     Patient was warned about not using Cipro and similar antibiotics. Recent studies have raised concern that fluoroquinolone antibiotics could be associated with an increased risk of aortic aneurysm Fluoroquinolones have non-antimicrobial properties that might jeopardise the integrity of the extracellular matrix of the vascular wall In a  propensity score matched cohort study in [place], there was a 66% increased rate of aortic aneurysm or dissection associated with oral fluoroquinolone use, compared wit   Sulfonamide Derivatives Other (See Comments)    Childhood reaction     Patient Measurements: Height: _0  (185.4 cm) Weight: 130.2 kg (287 lb 0.6 oz) IBW/kg (Calculated) : 79.9  Vital Signs: Temp: 98.2 F (36.8 C) (09/22 1000) Temp Source: Bladder (09/22 0400) BP: 109/62 (09/22 1000) Pulse Rate: 77 (09/22 0832)  Labs: Recent Labs    09/18/21 0349 09/19/21 0428 09/19/21 0903 09/20/21 0428 09/20/21 0831  HGB 11.5* 11.2* 10.9* 10.3* 10.2*  HCT 36.6* 35.6* 32.0* 33.5* 30.0*  PLT 203 198  --  200  --   LABPROT 20.9* 20.5*  --  21.2*  --   INR 1.8* 1.8*  --  1.8*  --   CREATININE 1.89* 2.31*  --  2.23*  --      Estimated Creatinine Clearance: 43 mL/min (A) (by C-G formula based on SCr of 2.23 mg/dL (H)).   Medical History: Past Medical History:  Diagnosis Date   Anxiety    Atrial fibrillation ([place]) 03/22/2009   a. s/p multiple DCCV; b. no coumadin due to low TE risk profile; c. Tikosyn Rx   COPD (chronic obstructive pulmonary disease) (HCC)    Coronary  atherosclerosis of native coronary artery 11/2002   a. s/p stent to LAD 12/03; OM2 occluded at cath 12/03; d. myoview 5/10: no ischemia;  e. echo 7/11: EF 55%, BAE, mild RVE, PASP 41-45; Myoview was in March 2013. There was no ischemia or infarction, EF 51%    Cutaneous abscess of back excluding buttocks 07/04/2014   Appears to stem from possibly a cyst very large area 6 cm contact surgeon office    Diabetes mellitus without complication ([place])    Drusen body    see opth note   Dyspnea    Dysrhythmia    Epiglottitis    w emergency nt intubation   ERECTILE DYSFUNCTION 03/22/2009   GERD 03/22/2009   Heart murmur    HYPERGLYCEMIA 04/25/2010   HYPERLIPIDEMIA 03/22/2009   Hypertension    Iliac aneurysm ([place])    2.6 to be evaluated incidental finding on CT   LATERAL EPICONDYLITIS, LEFT 10/24/2009   LIVER FUNCTION TESTS, ABNORMAL 04/25/2010   Local reaction to immunization 05/05/2012   minor resolving  zostavax    Myocardial infarction [facility]) mi2003   Numbness in left leg    foot related to back disease and surgery   Obesity, unspecified 04/24/2009   Perforated appendicitis with necrosis s/p open appendectomy 06/07/14 06/04/2014   Renal cyst    Characterized by MRI as simple   Ruptured suppurative appendicitis    2015    SLEEP APNEA, OBSTRUCTIVE  03/22/2009   compliant with CPAP   THROMBOCYTOPENIA 08/16/2010   TOBACCO USE, QUIT 10/24/2009   ULNAR NEUROPATHY, LEFT 92/78/0044   Umbilical hernia     Medications:  Scheduled:   amiodarone  200 mg Per Tube BID   arformoterol  15 mcg Nebulization BID   aspirin  81 mg Per Tube Daily   atorvastatin  80 mg Per Tube Daily   bisacodyl  10 mg Rectal Q0600   brimonidine  1 drop Both Eyes TID   busPIRone  15 mg Per Tube BID   chlorhexidine gluconate (MEDLINE KIT)  15 mL Mouth Rinse BID   Chlorhexidine Gluconate Cloth  6 each Topical Daily   clonazePAM  1 mg Per Tube TID   docusate  200 mg Per Tube Daily   feeding supplement (PROSource TF)   45 mL Per Tube BID   feeding supplement (VITAL 1.5 CAL)  1,000 mL Per Tube Q24H   fluticasone  2 spray Each Nare Daily   free water  200 mL Per Tube Q6H   insulin aspart  0-20 Units Subcutaneous Q4H   insulin detemir  30 Units Subcutaneous BID   mouth rinse  15 mL Mouth Rinse q12n4p   midodrine  5 mg Per Tube TID WC   pantoprazole sodium  40 mg Per Tube Daily   PARoxetine  60 mg Per Tube Daily   polyethylene glycol  17 g Per Tube Daily   revefenacin  175 mcg Nebulization Daily   senna  2 tablet Per Tube QHS   sodium chloride flush  3 mL Intravenous Q12H   Warfarin - Pharmacist Dosing Inpatient   Does not apply q1600    Assessment: 70 yo M s/p bioprosthetic AVR, atrial clip, and MAZE procedure on 9/8. Patient has a PMH of a fib on apixaban PTA and has been in a fib s/p MAZE procedure currently on an amio drip.   INR subtherapeutic 1.8  today, CBC remains stable, given subtherapeutic INR yesterday at 1.8 will given 6 mg tonight. ASA dropped to 81 mg so will need to get INR therapeutic.   Goal of Therapy:  INR 2-3 Monitor platelets by anticoagulation protocol: Yes   Plan:  Warfarin 6 mg x1 tonight Daily INR  Cathrine Muster, PharmD PGY2 Cardiology Pharmacy Resident Phone: [phone] 09/20/2021  11:18 AM  Please check [email or web address] for unit-specific pharmacy phone numbers.

## 2021-09-20 NOTE — Progress Notes (Signed)
NAME:  [name] Isenberg., MRN:  [id number], DOB:  [birth year], LOS: 9 ADMISSION DATE:  09/26/2021, CONSULTATION DATE:  09/07/21 REFERRING MD:  Dr. [name], CHIEF COMPLAINT:  Resp failure   Brief HIstory  HPI obtained from medical chart review as patient is intubated and sedated on mechanical ventilation.   70 year old with poor pulmonary baseline (COPD, OSA) presenting with subacute aortic insufficiency s/p AVR showing chronic endocarditis type changes.  Postop course complicated by fluid overload and difficult to wean O2.  Pertinent  Medical History  Former Tobacco abuse (quit 2010), centrilobular emphysema, pulmonary hypertension, OSA on CPAP, PAF, CAD w/ LAD stent 2003, HLD, MSSA sepsis c/b lumbar discitis and infected left sternoclavicular joint 08/2019   Significant Hospital Events: Including procedures, antibiotic start and stop dates in addition to other pertinent events   9/8 aortic valve replacement with bioprosthetic valve and biatrial Maze procedure 9/8 ETT >> 9/8 foley >> 9/8 R IJ cordis with PA cath >> 9/8 L radial Aline >> 9/8 L femoral aline (placed in OR given variable L radial tracings)>> 9/8 parasternal pacing wires >> 9/8 mediastinal CT x 2 >>  9/8 aortic tissue cx >> 9/8 aortic tissue AFB >> 9/8 aortic fungal >> 9/9 Bcx 2 >>  9/8 ancef >>9/9 9/8 vanc >> 9/9 cefepime >> 9/15: L lung white out req bronch  9/17: off nitric, lasix ggt, self extubated, reintubated 9/20: extubated to BIPAP and diuresis  Interim History / Subjective:  O2 needs a bit improved with seat position on bed.  Objective   Blood pressure (!) 104/58, pulse 77, temperature 98.2 F (36.8 C), resp. rate (!) 23, height _0  (1.854 m), weight 130.2 kg, SpO2 92 %. CVP:  [0 mmHg-8 mmHg] 7 mmHg  Vent Mode: BIPAP;PCV FiO2 (%):  [100 %] 100 % Set Rate:  [12 bmp] 12 bmp PEEP:  [12 [name]20] 12 [name]20   Intake/Output Summary (Last 24 hours) at 09/20/2021 0847 Last data filed at 09/20/2021 0962 Gross  per 24 hour  Intake 1978.12 ml  Output 2160 ml  Net -181.88 ml    Filed Weights   09/18/21 0347 09/19/21 0233 09/20/21 0500  Weight: 133.6 kg 131.5 kg 130.2 kg    Examination: Ill appearing man on BIPAP Occasionally follows commands Improved air movement Continued mild anasarca  BUN up Cr improved Na up CXR improved, LLL was likely just atelectasis  Resolved Hospital Problem list   Thrombocytopenia- improving Ileus vs constipation Question HCAP, BAL w/ normal flora; secretion burden improved with abx, s/p 5 days HCAP tx ended 9/21 Question parapneumonic effusion- drained 9/19; either post AVR inflammatory or parapneumonic, cultures pending  Assessment & Plan:   Acute on chronic hypoxic respiratory failure (on home 4L O2 at home) OSA, on home CPAP.  Baseline poor pulmonary reserve making weaning difficult.  He still appears wet on CXR and CVP but diuresis limited by renal function.  Overall he looks a bit better today; going to see if we can get him on HFNC. 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, PVR preserved on cath in July AI s/p AVR and MAZE procedure Hx PAF, CAD with LAD stent AKI on CKD- going to give a break on diuretics today Class 3 obesity DM2  P: Diuretic holiday Trial of HFNC Start low dose FWF Encourage mobility as able Hopefully can squeek by without needing tracheostomy but remains high risk for this Keep in ICU Levemir increased by TCTS Will  follow  Best Practice (right click and "Reselect all SmartList Selections" daily)   Diet/type: tubefeeds DVT prophylaxis: warfarin GI prophylaxis: PPI Lines: yes and it is still needed Foley:  Yes, and it is still needed Code Status:  full code Last date of multidisciplinary goals of care discussion [wife updated at bedside on 9/21]  Patient critically ill due to respiratory failure Interventions to address this today BIPAP  titration, intubation watch Risk of deterioration without these interventions is high  I personally spent 37 minutes providing critical care not including any separately billable procedures  Erskine Emery MD [facility] epic messenger for cross cover needs If after hours, please call E-link

## 2021-09-20 NOTE — Progress Notes (Addendum)
Patient ID: [name] Brue., male   DOB: [birth year], 70 y.o.   MRN: [id number]     Advanced Heart Failure Rounding Note  PCP-Cardiologist: [name] Ruths, MD   Subjective:    9/11: Echo EF 40-45% RV ok. RVSP 22.8  9/14 - 9/15 Developed hypoxia, CXR with left lung white-out, underwent bronchoscopy with treatment of mucus plug. Placed on norepi overnight.  9/16: iNO stopped.  9/17: Self-extubated, had to be reintubated.  9/19: Left thoracentesis 9/20: Extubated to BiPAP   Remains on BiPAP. FiO2 100%. Lightly sedated on Precedex   Off NE and milrinone. Co-ox stable 78%.   Diuretics held yesterday w/ bump in SCr. Improved today, 2.31>>2.23. BUN higher 103>>119 but getting TPN. Not following commands. ? Uremia   CVP 12   Na 148   WBC trending down 13.9>>11.7. AF.   INR 1.8  Objective:   Weight Range: 130.2 kg Body mass index is 37.87 kg/m.   Vital Signs:   Temp:  [98.4 F (36.9 C)-99.9 F (37.7 C)] 98.4 F (36.9 C) (09/22 0645) Pulse Rate:  [55-85] 85 (09/22 0400) Resp:  [14-26] 17 (09/22 0645) BP: (104-153)/(50-85) 104/58 (09/22 0600) SpO2:  [78 %-98 %] 95 % (09/22 0645) Arterial Line BP: (110-166)/(45-78) 118/48 (09/22 0645) FiO2 (%):  [100 %] 100 % (09/22 0353) Weight:  [130.2 kg] 130.2 kg (09/22 0500) Last BM Date: 09/19/21  Weight change: Filed Weights   09/18/21 0347 09/19/21 0233 09/20/21 0500  Weight: 133.6 kg 131.5 kg 130.2 kg    Intake/Output:   Intake/Output Summary (Last 24 hours) at 09/20/2021 0711 Last data filed at 09/20/2021 3428 Gross per 24 hour  Intake 1978.12 ml  Output 2360 ml  Net -381.88 ml      Physical Exam   CVP 12  General:  lightly sedated on precedex on bipap.  Not following commands. No respiratory difficulty  HEENT: normal Neck: supple. JVD not well visualized, + Lt IJ CVC Carotids 2+ bilat; no bruits. No lymphadenopathy or thyromegaly appreciated. Cor: PMI nondisplaced. Regular rate & rhythm. No rubs, gallops or  murmurs. Sternotomy site ok.  Lungs: clear Abdomen: soft, nontender, nondistended. No hepatosplenomegaly. No bruits or masses. Good bowel sounds. Extremities: no cyanosis, clubbing, rash, trace bilateral LE edema +SCDs  Neuro: lightly sedated on precedex, moving upper extremities. Not following commands    Telemetry   Sinus brady 50s (personally reviewed)   EKG    N/A  Labs    CBC Recent Labs    09/19/21 0428 09/19/21 0903 09/20/21 0428  WBC 13.9*  --  11.7*  HGB 11.2* 10.9* 10.3*  HCT 35.6* 32.0* 33.5*  MCV 93.7  --  95.7  PLT 198  --  768   Basic Metabolic Panel Recent Labs    09/19/21 0428 09/19/21 0903 09/20/21 0428  NA 145 147* 148*  K 4.2 4.2 4.3  CL 102  --  105  CO2 31  --  34*  GLUCOSE 213*  --  222*  BUN 103*  --  119*  CREATININE 2.31*  --  2.23*  CALCIUM 9.4  --  9.2  MG 2.3  --  2.7*   Liver Function Tests No results for input(s): AST, ALT, ALKPHOS, BILITOT, PROT, ALBUMIN in the last 72 hours.  No results for input(s): LIPASE, AMYLASE in the last 72 hours. Cardiac Enzymes No results for input(s): CKTOTAL, CKMB, CKMBINDEX, TROPONINI in the last 72 hours.  BNP: BNP (last 3 results) No results for input(s): BNP in the  last 8760 hours.  ProBNP (last 3 results) No results for input(s): PROBNP in the last 8760 hours.   D-Dimer No results for input(s): DDIMER in the last 72 hours. Hemoglobin A1C No results for input(s): HGBA1C in the last 72 hours. Fasting Lipid Panel No results for input(s): CHOL, HDL, LDLCALC, TRIG, CHOLHDL, LDLDIRECT in the last 72 hours. Thyroid Function Tests No results for input(s): TSH, T4TOTAL, T3FREE, THYROIDAB in the last 72 hours.  Invalid input(s): FREET3  Other results:   Imaging    DG Chest 1 View  Result Date: 09/19/2021 CLINICAL DATA:  Respiratory failure EXAM: CHEST  1 VIEW COMPARISON:  09/17/2021 FINDINGS: Cardiac enlargement. Status post median sternotomy, aortic valve replacement and left atrial  clipping. Aortic atherosclerosis. Unchanged left IJ catheter tip projects over the SVC. Feeding tube tip is below the GE junction. Interval removal of the Swan-Ganz catheter. There is a moderate to large left pleural effusion which appears increased from the previous exam. Mild interstitial edema is identified. Airspace opacities scratch set progressive airspace opacities identified within the right lower lung and left mid lung. IMPRESSION: 1. Worsening aeration to the right lower lung and left mid lung. 2. Increase in size of left pleural effusion with mild diffuse edema. Electronically Signed   By: Kerby Moors M.D.   On: 09/19/2021 09:53     Medications:     Scheduled Medications:  amiodarone  200 mg Per Tube BID   arformoterol  15 mcg Nebulization BID   aspirin  81 mg Per Tube Daily   atorvastatin  80 mg Per Tube Daily   bisacodyl  10 mg Rectal Q0600   brimonidine  1 drop Both Eyes TID   busPIRone  15 mg Per Tube BID   chlorhexidine gluconate (MEDLINE KIT)  15 mL Mouth Rinse BID   Chlorhexidine Gluconate Cloth  6 each Topical Daily   clonazePAM  1 mg Per Tube TID   docusate  200 mg Per Tube Daily   feeding supplement (PROSource TF)  45 mL Per Tube BID   feeding supplement (VITAL 1.5 CAL)  1,000 mL Per Tube Q24H   fluticasone  2 spray Each Nare Daily   insulin aspart  0-20 Units Subcutaneous Q4H   insulin detemir  25 Units Subcutaneous BID   mouth rinse  15 mL Mouth Rinse q12n4p   midodrine  5 mg Per Tube TID WC   pantoprazole sodium  40 mg Per Tube Daily   PARoxetine  60 mg Per Tube Daily   polyethylene glycol  17 g Per Tube Daily   revefenacin  175 mcg Nebulization Daily   senna  2 tablet Per Tube QHS   sodium chloride flush  3 mL Intravenous Q12H   Warfarin - Pharmacist Dosing Inpatient   Does not apply q1600    Infusions:  sodium chloride 10 mL/hr at 09/13/21 0816   sodium chloride     sodium chloride Stopped (09/17/21 1700)   dexmedetomidine (PRECEDEX) IV infusion 0.4  mcg/kg/hr (09/20/21 0600)   lactated ringers Stopped (09/19/21 1250)   lactated ringers 20 mL/hr at 09/10/21 1517    PRN Medications: sodium chloride, dextrose, fentaNYL, levalbuterol, morphine injection, ondansetron (ZOFRAN) IV, sodium chloride flush    Patient Profile   70 y.o. with history of COPD/smoking, chronic severe aortic insufficiency, paroxysmal atrial fibrillation, CAD, type 2 diabetes, and CKD stage 3.  Patient had PCI to LAD in 12/03, LHC in 7/22 showed only mild CAD.  He has paroxysmal atrial fibrillation and  had been maintained on Tikosyn.  He had not been ablated due to obesity.  Patient had lumbar discitis and left sternoclavicular joint infection in 9/20, had MSSA.  He had I&D left [place] joint and IV abx.  Echo at the time showed no vegetation and trivial AI.    HF team consulted for shock/pulmonary hypertension.   Assessment/Plan   1. Aortic insufficiency: TEE pre-op with severe AI, tricuspid aortic valve.  The LV was moderately dilated.  Now s/p bioprosthetic AVR.  The native valve was perforated with possible vegetation.  Suspect he did not have acute endocarditis, tissue cultures remain negative.  Rheumatological serologic workup negative.  - continue supportive care 2. Shock: Post-op.  Suspect mixed picture with RV dysfunction/severe pulmonary hypertension as well as likely distributive/septic component with low SVR.  Remains afebrile.  Now off NE and milrinone. Co-ox stable 78%.  - Continue midodrine 15 mg tid.   - Vanc/cefepime now off.  3.  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  Post-op echo with LV down 40-45% (post-correction of severe AI), normal RV, normal bioprosthetic aortic valve.  Good diuresis with Lasix gtt. Lasix stopped 9/21 after bump in SCr and BUN.  Suspect he has some underlying RV dysfunction with  pulmonary hypertension, probably needs to run a CVP in the 10-12 range. CVP 12 today. SCr improving.  - continue to hold diuretics today  4.  Pulmonary hypertension: Suspect mixed group 2/3 pulmonary hypertension + high output.  Has significant lung parenchymal disease, CTA chest in 6/22 with emphysema as well as areas of fibrosis, as well as OSA and ?OHS.  Pre-op was on CPAP and 4L home oxygen.  Initially, peri-operative PA pressure was very high, in the 90/45 range. Now off iNO. PA pressures were significantly lower when Swan still in place and PVR was low.  5. Acute on chronic hypoxemic respiratory failure: Intubated post-op.  Baseline COPD + OSA + ?OHS and on home oxygen 4 liters. Now with post-op volume overload as well. 9/15 mucus plug with left lung white-out, S/P Bronch with removal of plug - sputum cx pending. Repeat CXR post-bronch significantly improved.  Self-extubated 9/17, had to be reintubated. 9/19 had thoracentesis on left.  Extubated to BiPAP 9/20. Remains on BiPAP  - Brovana and Yupelri ordered  - Hold diuretics per above - Completed abx.  6. ID: Cultures including tissue culture from valve so far negative.  9/20 MSSA discitis.  ID following. Concern for possible distributive shock component. Blood cultures from 9/9 with NGTD. WBC normal, afebrile. PCT 0.33.  - completed 5 days course of vancomycin/cefepime for HCAP.  7. AKI on CKD stage 3: Creatinine improving off diuretics, 1.89>>2.31>>2.2. BUN higher 103>>119. Not following commands. ? Uremia. ? From protein load from TPN. Nonoliguric  - hold diuretics - continue midodrine for BP support/ renal perfusion, keep MAPs ~70  - monitor for uremic symptoms (+tremor), ?need for CVVH  8. Atrial fibrillation: Paroxysmal.  On Tikosyn pre-op.  Had Maze procedure. In AF currently. Now back in NSR.   - Continue PO Amiodarone  - Continue warfarin. INR 1.8  9. Hypernatremia - Na 148 - free water boluses   [name] Jester, PA-C   09/20/2021 7:11 AM  Patient seen with PA, agree with the above note.   Moves all limbs but not following commands this morning. Remains on Bipap FiO2 100%.  Co-ox 78%.  Creatinine lower but BUN higher. I/Os even. CVP still 11-12. Midodrine 15 mg tid with stable BP. Off pressors.   General: NAD Neck: Thick, JVP difficult, no thyromegaly or thyroid nodule.  Lungs: Clear to auscultation bilaterally with normal respiratory effort. CV: Nondisplaced PMI.  Heart regular S1/S2, no S3/S4, no murmur.  No peripheral edema.   Abdomen: Soft, nontender, no hepatosplenomegaly, no distention.  Skin: Intact without lesions or rashes.  Neurologic: not following commands.  Extremities: No clubbing or cyanosis.  HEENT: Normal.   Baseline severe pulmonary disease, now remains on Bipap 100%.  Discussed with Dr. Tamala Julian, suspect he may require trach.   Off pressors.  CVP mildly elevated but likely requires CVP 10-12 range with chronic RV failure.  I/Os even.  BUN remains high though creatinine coming down.  - Hold diuretics again today.   Off abx, afebrile.  [name] [name] 09/20/2021 8:06 AM

## 2021-09-20 NOTE — Progress Notes (Signed)
Patient ID: [name] [name]., male   DOB: [birth year], 70 y.o.   MRN: [id number] TCTS Evening Rounds:  He has been hemodynamically stable in sinus rhythm. Was on HFNC for a few hrs today but pulled it off and desaturated into the 50's. Now back on bipap. He is calm and responsive at this time. His friend is visiting with him.  Urine output ok.

## 2021-09-20 NOTE — Progress Notes (Signed)
14 Days Post-Op Procedure(s) (LRB): AORTIC VALVE REPLACEMENT (AVR) USING INSPIRIS AORTIC VALVE 27 MM (N/A) MAZE (N/A) TRANSESOPHAGEAL ECHOCARDIOGRAM (TEE) (N/A) APPLICATION OF CELL SAVER CLIPPING OF ATRIAL APPENDAGE USING ATRICLIP PRO 240 Subjective: He has been stable overnight on bipap 100% oxygen with sats 96-99%.  Remains lightly sedated on Precedex.  Objective: Vital signs in last 24 hours: Temp:  [98.4 F (36.9 C)-99.9 F (37.7 C)] 98.4 F (36.9 C) (09/22 0645) Pulse Rate:  [55-85] 85 (09/22 0400) Cardiac Rhythm: Sinus bradycardia (09/22 0400) Resp:  [14-26] 17 (09/22 0645) BP: (104-153)/(50-85) 104/58 (09/22 0600) SpO2:  [78 %-98 %] 95 % (09/22 0645) Arterial Line BP: (110-166)/(45-78) 118/48 (09/22 0645) FiO2 (%):  [100 %] 100 % (09/22 0353) Weight:  [130.2 kg] 130.2 kg (09/22 0500)  Hemodynamic parameters for last 24 hours: CVP:  [0 mmHg-12 mmHg] 7 mmHg  Intake/Output from previous day: 09/21 0701 - 09/22 0700 In: 1978.1 [I.V.:548.1; NG/GT:1430] Out: 2360 [Urine:2360] Intake/Output this shift: No intake/output data recorded.  General appearance: sedated and calm but gets restless with examination. Not focusing on me or following commands. Neurologic: moving all extremities strongly. Heart: regular rate and rhythm, S1, S2 normal, no murmur Lungs: clear to auscultation bilaterally Abdomen: soft, non-tender; bowel sounds normal Extremities: edema mild peripheral edema Wound: incision looks good.  Lab Results: Recent Labs    09/19/21 0428 09/19/21 0903 09/20/21 0428  WBC 13.9*  --  11.7*  HGB 11.2* 10.9* 10.3*  HCT 35.6* 32.0* 33.5*  PLT 198  --  200   BMET:  Recent Labs    09/19/21 0428 09/19/21 0903 09/20/21 0428  NA 145 147* 148*  K 4.2 4.2 4.3  CL 102  --  105  CO2 31  --  34*  GLUCOSE 213*  --  222*  BUN 103*  --  119*  CREATININE 2.31*  --  2.23*  CALCIUM 9.4  --  9.2    PT/INR:  Recent Labs    09/20/21 0428  LABPROT 21.2*  INR  1.8*   ABG    Component Value Date/Time   PHART 7.425 09/19/2021 0903   HCO3 35.5 (H) 09/19/2021 0903   TCO2 37 (H) 09/19/2021 0903   ACIDBASEDEF 1.0 09/12/2021 0322   O2SAT 78.3 09/20/2021 0428   CBG (last 3)  Recent Labs    09/19/21 2046 09/19/21 2329 09/20/21 0428  GLUCAP 188* 195* 192*    Assessment/Plan: S/P Procedure(s) (LRB): AORTIC VALVE REPLACEMENT (AVR) USING INSPIRIS AORTIC VALVE 27 MM (N/A) MAZE (N/A) TRANSESOPHAGEAL ECHOCARDIOGRAM (TEE) (N/A) APPLICATION OF CELL SAVER CLIPPING OF ATRIAL APPENDAGE USING ATRICLIP PRO 240  POD 14   He has been hemodynamically stable in sinus rhythm off IV pressors but still on midodrine which is down to 5 tid. Co-ox good at 78%.   Respiratory status still tenuous on 100% bipap but sats better, lungs sound clearer and CXR this am shows improved aeration, particularly in LLL.    -381 cc yesterday off lasix. UO still 100-200/hr. Creat down slightly and BUN up slightly. Wt down 3 lbs from yesterday. CVP 7. He has extra volume overall but probably intravascularly on dry side. I think we should leave him off diuretic for a while until kidneys recover.  Continue tube feeds at 65/hr.   Glucose still about 200. Increase Levemir to 30 bid.   Coumadin per pharmacy for AF.  Mental status stable: delirium    LOS: 14 days    [name] 09/20/2021

## 2021-09-20 NOTE — Progress Notes (Signed)
   09/20/21 1020  Clinical Encounter Type  Visited With Patient and family together  Visit Type Initial;Psychological support;Spiritual support;Social support;Critical Care  Referral From Nurse  Spiritual Encounters  Spiritual Needs Prayer;Emotional  Stress Factors  Family Stress Factors Health changes   [place] visited pt. per suggestion from RN; pt. lying in bed wearing nasal canula, appearing drowsy.  Wife Gerri at bedside.  Wife expressed gratitude for visit as her pastor is new to their church and she did not feel comfortable calling him.  Wife explained pt. had bypass surgery this admission to [facility] and needed ventilator support following surgery; he was weaned to BiPap and now to a nasal canula and wife requested prayer that pt. will not need to be intubated again.  [place] prayed for pt. and wife.  No further needs at this time but chaplains remain available.  Lindaann Pascal, Chaplain Pager: [phone]

## 2021-09-21 DIAGNOSIS — J9601 Acute respiratory failure with hypoxia: Secondary | ICD-10-CM | POA: Diagnosis not present

## 2021-09-21 LAB — CBC
HCT: 32.8 % — ABNORMAL LOW (ref 39.0–52.0)
Hemoglobin: 10.1 g/dL — ABNORMAL LOW (ref 13.0–17.0)
MCH: 30.3 pg (ref 26.0–34.0)
MCHC: 30.8 g/dL (ref 30.0–36.0)
MCV: 98.5 fL (ref 80.0–100.0)
Platelets: 194 10*3/uL (ref 150–400)
RBC: 3.33 MIL/uL — ABNORMAL LOW (ref 4.22–5.81)
RDW: 15.6 % — ABNORMAL HIGH (ref 11.5–15.5)
WBC: 12 10*3/uL — ABNORMAL HIGH (ref 4.0–10.5)
nRBC: 0 % (ref 0.0–0.2)

## 2021-09-21 LAB — COOXEMETRY PANEL
Carboxyhemoglobin: 1.4 % (ref 0.5–1.5)
Methemoglobin: 1 % (ref 0.0–1.5)
O2 Saturation: 65.9 %
Total hemoglobin: 10 g/dL — ABNORMAL LOW (ref 12.0–16.0)

## 2021-09-21 LAB — MAGNESIUM: Magnesium: 3 mg/dL — ABNORMAL HIGH (ref 1.7–2.4)

## 2021-09-21 LAB — PROTIME-INR
INR: 1.9 — ABNORMAL HIGH (ref 0.8–1.2)
Prothrombin Time: 22.1 seconds — ABNORMAL HIGH (ref 11.4–15.2)

## 2021-09-21 LAB — BASIC METABOLIC PANEL
Anion gap: 8 (ref 5–15)
BUN: 110 mg/dL — ABNORMAL HIGH (ref 8–23)
CO2: 34 mmol/L — ABNORMAL HIGH (ref 22–32)
Calcium: 9 mg/dL (ref 8.9–10.3)
Chloride: 108 mmol/L (ref 98–111)
Creatinine, Ser: 2.15 mg/dL — ABNORMAL HIGH (ref 0.61–1.24)
GFR, Estimated: 32 mL/min — ABNORMAL LOW (ref >60)
Glucose, Bld: 227 mg/dL — ABNORMAL HIGH (ref 70–99)
Potassium: 4.3 mmol/L (ref 3.5–5.1)
Sodium: 150 mmol/L — ABNORMAL HIGH (ref 135–145)

## 2021-09-21 LAB — GLUCOSE, CAPILLARY
Glucose-Capillary: 152 mg/dL — ABNORMAL HIGH (ref 70–99)
Glucose-Capillary: 169 mg/dL — ABNORMAL HIGH (ref 70–99)
Glucose-Capillary: 179 mg/dL — ABNORMAL HIGH (ref 70–99)
Glucose-Capillary: 189 mg/dL — ABNORMAL HIGH (ref 70–99)
Glucose-Capillary: 191 mg/dL — ABNORMAL HIGH (ref 70–99)
Glucose-Capillary: 195 mg/dL — ABNORMAL HIGH (ref 70–99)

## 2021-09-21 MED ORDER — INSULIN DETEMIR 100 UNIT/ML ~~LOC~~ SOLN
40.0000 [IU] | Freq: Two times a day (BID) | SUBCUTANEOUS | Status: DC
  Administered 2021-09-21 – 2021-09-22 (×3): 40 [IU] via SUBCUTANEOUS
  Filled 2021-09-21 (×5): qty 0.4

## 2021-09-21 MED ORDER — WARFARIN SODIUM 7.5 MG PO TABS
7.5000 mg | ORAL_TABLET | Freq: Once | ORAL | Status: AC
  Administered 2021-09-21: 7.5 mg via ORAL
  Filled 2021-09-21: qty 1

## 2021-09-21 MED ORDER — BISACODYL 5 MG PO TBEC
10.0000 mg | DELAYED_RELEASE_TABLET | Freq: Every day | ORAL | Status: DC
  Administered 2021-09-22: 10 mg via ORAL
  Filled 2021-09-21: qty 2

## 2021-09-21 NOTE — Progress Notes (Addendum)
EVENING ROUNDS NOTE :     [address]       [place] [address]             [phone]                 15 Days Post-Op Procedure(s) (LRB): AORTIC VALVE REPLACEMENT (AVR) USING INSPIRIS AORTIC VALVE 27 MM (N/A) MAZE (N/A) TRANSESOPHAGEAL ECHOCARDIOGRAM (TEE) (N/A) APPLICATION OF CELL SAVER CLIPPING OF ATRIAL APPENDAGE USING ATRICLIP PRO 240  Total Length of Stay:  LOS: 15 days  BP 126/68   Pulse 90   Temp 99.2 F (37.3 C) (Oral)   Resp (!) 24   Ht 6\' 1"  (1.854 m)   Wt 129.6 kg   SpO2 (!) 84%   BMI 37.70 kg/m   .Intake/Output      09/22 0701 09/23 0700 09/23 0701 09/24 0700   P.O. 0    I.V. (mL/kg) 200.3 (1.5)    NG/GT 1780 740   Total Intake(mL/kg) 1980.3 (15.3) 740 (5.7)   Urine (mL/kg/hr) 1970 (0.6) 275 (0.3)   Emesis/NG output  0   Stool 0    Total Output 1970 275   Net +10.3 +465        Stool Occurrence 1 x       sodium chloride 10 mL/hr at 09/13/21 0816   sodium chloride     sodium chloride Stopped (09/17/21 1700)   dexmedetomidine (PRECEDEX) IV infusion Stopped (09/21/21 0800)   lactated ringers Stopped (09/19/21 1250)   lactated ringers 20 mL/hr at 09/10/21 1517     Lab Results  Component Value Date   WBC 12.0 (H) 09/21/2021   HGB 10.1 (L) 09/21/2021   HCT 32.8 (L) 09/21/2021   PLT 194 09/21/2021   GLUCOSE 227 (H) 09/21/2021   CHOL 134 07/13/2021   TRIG 103.0 07/13/2021   HDL 45.50 07/13/2021   LDLCALC 68 07/13/2021   ALT 14 09/08/2021   AST 61 (H) 09/08/2021   NA 150 (H) 09/21/2021   K 4.3 09/21/2021   CL 108 09/21/2021   CREATININE 2.15 (H) 09/21/2021   BUN 110 (H) 09/21/2021   CO2 34 (H) 09/21/2021   TSH 1.414 08/27/2019   PSA 1.28 09/14/2020   INR 1.9 (H) 09/21/2021   HGBA1C 6.5 (H) 09/04/2021   MICROALBUR 18.7 (H) 02/08/2019  Patient alternating between bi pap and HFNC. On bipap for better recovery after desat on HFNC-PEEP 12. Respiratory status still tenuous Previous a fib. SR this afternoon.On Midodrine 5 mg  tid, Amiodarone 200 mg bid, and Coumadin.     Lars Pinks PA-C 09/21/2021 3:14 PM  Agree with above  Lajuana Matte

## 2021-09-21 NOTE — Progress Notes (Addendum)
Patient ID: [name] Keetch., male   DOB: 02/24/1951, 70 y.o.   MRN: [id number]     Advanced Heart Failure Rounding Note  PCP-Cardiologist: [name], MD   Subjective:    9/11: Echo EF 40-45% RV ok. RVSP 22.8  9/14 - 9/15 Developed hypoxia, CXR with left lung white-out, underwent bronchoscopy with treatment of mucus plug. Placed on norepi overnight.  9/16: iNO stopped.  9/17: Self-extubated, had to be reintubated.  9/19: Left thoracentesis 9/20: Extubated to BiPAP   On Bipap last night. Switched to HFNC this morning. Sats 85-90s.  Lightly sedated on Precedex   Off pressors Co-ox stable  66% .   Off diuretics for the last few days with worsening renal function.   Na 150 -->on free water.   WBC trending down 13.9>>11.7>12    INR 1.9   Follows commands.    Objective:   Weight Range: 129.6 kg Body mass index is 37.7 kg/m.   Vital Signs:   Temp:  [97.7 F (36.5 C)-99.1 F (37.3 C)] 97.9 F (36.6 C) (09/23 0600) Pulse Rate:  [50-77] 77 (09/23 0539) Resp:  [16-26] 20 (09/23 0700) BP: (98-168)/(57-82) 122/69 (09/23 0700) SpO2:  [85 %-99 %] 93 % (09/23 0700) Arterial Line BP: (124-162)/(52-71) 162/69 (09/22 1230) FiO2 (%):  [100 %] 100 % (09/23 0539) Weight:  [129.6 kg] 129.6 kg (09/23 0500) Last BM Date: 09/19/21  Weight change: Filed Weights   09/19/21 0233 09/20/21 0500 09/21/21 0500  Weight: 131.5 kg 130.2 kg 129.6 kg    Intake/Output:   Intake/Output Summary (Last 24 hours) at 09/21/2021 0726 Last data filed at 09/21/2021 0600 Gross per 24 hour  Intake 1980.29 ml  Output 1970 ml  Net 10.29 ml      Physical Exam   CVP 6-9  General:  No resp difficulty HEENT: normal Neck: supple. no JVD. Carotids 2+ bilat; no bruits. No lymphadenopathy or thryomegaly appreciated. LIJ  Cor: PMI nondisplaced. Regular rate & rhythm. No rubs, gallops or murmurs. Lungs: clear on HFNC Abdomen: soft, nontender, nondistended. No hepatosplenomegaly. No bruits or  masses. Good bowel sounds. Extremities: no cyanosis, clubbing, rash, edema Neuro: alert & orientedx1 to self, cranial nerves grossly intact. moves all 4 extremities w/o difficulty.    Telemetry  SB-SR 50-60s   EKG    N/A  Labs    CBC Recent Labs    09/20/21 0428 09/20/21 0831 09/21/21 0349  WBC 11.7*  --  12.0*  HGB 10.3* 10.2* 10.1*  HCT 33.5* 30.0* 32.8*  MCV 95.7  --  98.5  PLT 200  --  093   Basic Metabolic Panel Recent Labs    09/20/21 0428 09/20/21 0831 09/21/21 0349  NA 148* 150* 150*  K 4.3 4.1 4.3  CL 105  --  108  CO2 34*  --  34*  GLUCOSE 222*  --  227*  BUN 119*  --  110*  CREATININE 2.23*  --  2.15*  CALCIUM 9.2  --  9.0  MG 2.7*  --  3.0*   Liver Function Tests No results for input(s): AST, ALT, ALKPHOS, BILITOT, PROT, ALBUMIN in the last 72 hours.  No results for input(s): LIPASE, AMYLASE in the last 72 hours. Cardiac Enzymes No results for input(s): CKTOTAL, CKMB, CKMBINDEX, TROPONINI in the last 72 hours.  BNP: BNP (last 3 results) No results for input(s): BNP in the last 8760 hours.  ProBNP (last 3 results) No results for input(s): PROBNP in the last 8760 hours.  D-Dimer No results for input(s): DDIMER in the last 72 hours. Hemoglobin A1C No results for input(s): HGBA1C in the last 72 hours. Fasting Lipid Panel No results for input(s): CHOL, HDL, LDLCALC, TRIG, CHOLHDL, LDLDIRECT in the last 72 hours. Thyroid Function Tests No results for input(s): TSH, T4TOTAL, T3FREE, THYROIDAB in the last 72 hours.  Invalid input(s): FREET3  Other results:   Imaging    No results found.   Medications:     Scheduled Medications:  amiodarone  200 mg Per Tube BID   arformoterol  15 mcg Nebulization BID   aspirin  81 mg Per Tube Daily   atorvastatin  80 mg Per Tube Daily   bisacodyl  10 mg Rectal Q0600   brimonidine  1 drop Both Eyes TID   busPIRone  15 mg Per Tube BID   chlorhexidine gluconate (MEDLINE KIT)  15 mL Mouth Rinse  BID   Chlorhexidine Gluconate Cloth  6 each Topical Daily   clonazePAM  1 mg Per Tube TID   docusate  200 mg Per Tube Daily   feeding supplement (PROSource TF)  45 mL Per Tube BID   feeding supplement (VITAL 1.5 CAL)  1,000 mL Per Tube Q24H   fluticasone  2 spray Each Nare Daily   free water  200 mL Per Tube Q6H   insulin aspart  0-20 Units Subcutaneous Q4H   insulin detemir  40 Units Subcutaneous BID   mouth rinse  15 mL Mouth Rinse q12n4p   midodrine  5 mg Per Tube TID WC   pantoprazole sodium  40 mg Per Tube Daily   PARoxetine  60 mg Per Tube Daily   polyethylene glycol  17 g Per Tube Daily   revefenacin  175 mcg Nebulization Daily   senna  2 tablet Per Tube QHS   sodium chloride flush  3 mL Intravenous Q12H   Warfarin - Pharmacist Dosing Inpatient   Does not apply q1600    Infusions:  sodium chloride 10 mL/hr at 09/13/21 0816   sodium chloride     sodium chloride Stopped (09/17/21 1700)   dexmedetomidine (PRECEDEX) IV infusion 0.5 mcg/kg/hr (09/21/21 0600)   lactated ringers Stopped (09/19/21 1250)   lactated ringers 20 mL/hr at 09/10/21 1517    PRN Medications: sodium chloride, dextrose, fentaNYL, levalbuterol, morphine injection, ondansetron (ZOFRAN) IV, sodium chloride flush    Patient Profile   70 y.o. with history of COPD/smoking, chronic severe aortic insufficiency, paroxysmal atrial fibrillation, CAD, type 2 diabetes, and CKD stage 3.  Patient had PCI to LAD in 12/03, LHC in 7/22 showed only mild CAD.  He has paroxysmal atrial fibrillation and had been maintained on Tikosyn.  He had not been ablated due to obesity.  Patient had lumbar discitis and left sternoclavicular joint infection in 9/20, had MSSA.  He had I&D left [place] joint and IV abx.  Echo at the time showed no vegetation and trivial AI.    HF team consulted for shock/pulmonary hypertension.   Assessment/Plan   1. Aortic insufficiency: TEE pre-op with severe AI, tricuspid aortic valve.  The LV was  moderately dilated.  Now s/p bioprosthetic AVR.  The native valve was perforated with possible vegetation.  Suspect he did not have acute endocarditis, tissue cultures remain negative.  Rheumatological serologic workup negative.  - continue supportive care 2. Shock: Post-op.  Suspect mixed picture with RV dysfunction/severe pulmonary hypertension as well as likely distributive/septic component with low SVR.  Remains afebrile.  Now off NE and milrinone.  Co-ox 66%.   - Continue midodrine 15 mg tid.   - Completed antibiotics.   3.  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  Post-op echo with LV down 40-45% (post-correction of severe AI), normal RV, normal bioprosthetic aortic valve.  Good diuresis with Lasix gtt. Lasix stopped 9/21 after bump in SCr and BUN.  Suspect he has some underlying RV dysfunction with pulmonary hypertension, probably needs to run a CVP rage 5-9 range.  - continue to hold diuretics today. 4.  Pulmonary hypertension: Suspect mixed group 2/3 pulmonary hypertension + high output.  Has significant lung parenchymal disease, CTA chest in 6/22 with emphysema as well as areas of fibrosis, as well as OSA and ?OHS.  Pre-op was on CPAP and 4L home oxygen.  Initially, peri-operative PA pressure was very high, in the 90/45 range. Now off iNO.  5. Acute on chronic hypoxemic respiratory failure: Intubated post-op.  Baseline COPD + OSA + ?OHS and on home oxygen 4 liters. Now with post-op volume overload as well. 9/15 mucus plug with left lung white-out, S/P Bronch with removal of plug - sputum cx pending. Repeat CXR post-bronch significantly improved.  Self-extubated 9/17, had to be reintubated. 9/19 had thoracentesis on left.  Extubated to BiPAP 9/20. - Now HFNC sats 86-90% - Brovana and Yupelri ordered  -Continue to hold diuretics per above -  Completed abx.  6. ID: Cultures including tissue culture from valve so far negative.  9/20 MSSA discitis.  ID following. Concern for possible distributive shock component. Blood cultures from 9/9 with NGTD. WBC normal, afebrile. PCT 0.33.  - completed 5 days course of vancomycin/cefepime for HCAP.  7. AKI on CKD stage 3: Creatinine improving off diuretics, 1.89>>2.31>>2.2>>2.15.  - BUN trending down 103>>119>>110>>108 .  - Following commands.   - Continue to hold diuretics.  - continue midodrine for BP support/ renal perfusion, keep MAPs ~70  - monitor for uremic symptoms (+tremor), ?need for CVVH  8. Atrial fibrillation: Paroxysmal.  On Tikosyn pre-op.  Had Maze procedure. In AF currently.  - Remains in NSR.  - Continue PO Amiodarone  - Continue warfarin. INR 1.9  9. Hypernatremia - Na 150  - Continue free water boluses   Consult OT/PT  [name] Grinder, [name] [name]  09/21/2021 7:26 AM  Patient seen with NP, agree with the above note.   He has been weaned to HFNC.  Co-ox 66%, CVP 8.  I/Os even yesterday with no diuretic.  BUN 110 with creatinine 2.15, mildly lower today.  He has not had diuretics for several days now.   General: NAD Neck: Thick, JVP 8 cm, no thyromegaly or thyroid nodule.  Lungs: Decreased bilaterally.  CV: Nondisplaced PMI.  Heart regular S1/S2, no S3/S4, no murmur.  Trace ankle edema.  Abdomen: Soft, nontender, no hepatosplenomegaly, no distention.  Skin: Intact without lesions or rashes.  Neurologic: Alert and oriented x 3.  Psych: Normal affect. Extremities: No clubbing or cyanosis.  HEENT: Normal.   With AKI and CVP 8, would hold off on diuretics again today.  Creatinine starting to trend down.   Baseline COPD + OSA/OSH, on home oxygen.  Slowing weaning down oxygen, now on HFNC.  Mobilize as much as possible.    Hypernatremia, getting free water boluses.   Loralie Champagne 09/21/2021 8:56 AM

## 2021-09-21 NOTE — Progress Notes (Addendum)
Pt continuing to alternate between Bipap and HFNC as he tolerates the work of breathing when on HFNC.    At 1400 pt sat at the edge of the bed for apprx 10 minutes and then stood at the bedside with the assist of the four wheel walker while on HFNC, sats become stable at 84%, pt became ruddy in color and legs shaky, RR more than 25/min, pt tolerated about 4-5 minutes of standing before being put back into bed.  Pt still recovering 1 hr later on HFNC and NRB, encouraged to breath deep through his nose, RR back below 20/min with sats 84-85%.  CCM Dr Lynetta Mare at the bedside with pt.  Continuing to assess and monitor.  1525: pt now resting comfortably on Bipap with eyes closed, sats 93%, RR of 20

## 2021-09-21 NOTE — Telephone Encounter (Signed)
Please send refill to patient's PCP

## 2021-09-21 NOTE — Progress Notes (Signed)
15 Days Post-Op Procedure(s) (LRB): AORTIC VALVE REPLACEMENT (AVR) USING INSPIRIS AORTIC VALVE 27 MM (N/A) MAZE (N/A) TRANSESOPHAGEAL ECHOCARDIOGRAM (TEE) (N/A) APPLICATION OF CELL SAVER CLIPPING OF ATRIAL APPENDAGE USING ATRICLIP PRO 240 Subjective: Stable night on bipap. He remains restless on Precedex 0.5.  Objective: Vital signs in last 24 hours: Temp:  [97.7 F (36.5 C)-99.1 F (37.3 C)] 97.9 F (36.6 C) (09/23 0600) Pulse Rate:  [50-77] 77 (09/23 0539) Cardiac Rhythm: Sinus bradycardia (09/23 0400) Resp:  [16-26] 20 (09/23 0700) BP: (98-168)/(57-82) 122/69 (09/23 0700) SpO2:  [85 %-99 %] 93 % (09/23 0700) Arterial Line BP: (124-162)/(52-71) 162/69 (09/22 1230) FiO2 (%):  [100 %] 100 % (09/23 0539) Weight:  [129.6 kg] 129.6 kg (09/23 0500)  Hemodynamic parameters for last 24 hours: CVP:  [4 mmHg-15 mmHg] 12 mmHg  Intake/Output from previous day: 09/22 0701 - 09/23 0700 In: 1980.3 [I.V.:200.3; NG/GT:1780] Out: 1970 [FEOFH:2197] Intake/Output this shift: No intake/output data recorded.  General appearance: more alert and cooperative this morning Neurologic: intact Heart: regular rate and rhythm, S1, S2 normal, no murmur Lungs: clear to auscultation bilaterally Abdomen: soft, non-tender; bowel sounds normal Extremities: edema mild Wound: incision healing well  Lab Results: Recent Labs    09/20/21 0428 09/20/21 0831 09/21/21 0349  WBC 11.7*  --  12.0*  HGB 10.3* 10.2* 10.1*  HCT 33.5* 30.0* 32.8*  PLT 200  --  194   BMET:  Recent Labs    09/20/21 0428 09/20/21 0831 09/21/21 0349  NA 148* 150* 150*  K 4.3 4.1 4.3  CL 105  --  108  CO2 34*  --  34*  GLUCOSE 222*  --  227*  BUN 119*  --  110*  CREATININE 2.23*  --  2.15*  CALCIUM 9.2  --  9.0    PT/INR:  Recent Labs    09/21/21 0349  LABPROT 22.1*  INR 1.9*   ABG    Component Value Date/Time   PHART 7.401 09/20/2021 0831   HCO3 36.4 (H) 09/20/2021 0831   TCO2 38 (H) 09/20/2021 0831    ACIDBASEDEF 1.0 09/12/2021 0322   O2SAT 65.9 09/21/2021 0349   CBG (last 3)  Recent Labs    09/20/21 1926 09/20/21 2335 09/21/21 0331  GLUCAP 184* 182* 191*    Assessment/Plan: S/P Procedure(s) (LRB): AORTIC VALVE REPLACEMENT (AVR) USING INSPIRIS AORTIC VALVE 27 MM (N/A) MAZE (N/A) TRANSESOPHAGEAL ECHOCARDIOGRAM (TEE) (N/A) APPLICATION OF CELL SAVER CLIPPING OF ATRIAL APPENDAGE USING ATRICLIP PRO 240  POD 15 He has been hemodynamically stable in sinus rhythm on midodrine 5 tid. Co-ox good at 66%.   Respiratory status still tenuous on 100% bipap but hopefully can transition to HFNC for longer period today. Required NT suctioning yesterday.   I=O,  UO still 100-200/hr. Creat and BUN down slightly. Wt down 1+ lbs from yesterday. He has extra volume overall but probably intravascularly on dry side. I think we should leave him off diuretic for a while until kidneys recover.   Continue tube feeds at 65/hr.   Glucose still about 200. Increase Levemir to 40 bid.   Coumadin per pharmacy for AF.   Mental status seems improved to me. Able to calm him down easily when restless.  LOS: 15 days    [name] 09/21/2021

## 2021-09-21 NOTE — Plan of Care (Signed)
  Problem: Education: Goal: Knowledge of General Education information will improve Description: Including pain rating scale, medication(s)/side effects and non-pharmacologic comfort measures 09/21/2021 1116 by Tiburcio Bash, RN Outcome: Progressing 09/21/2021 1116 by Tiburcio Bash, RN Outcome: Progressing   Problem: Health Behavior/Discharge Planning: Goal: Ability to manage health-related needs will improve 09/21/2021 1116 by Tiburcio Bash, RN Outcome: Progressing 09/21/2021 1116 by Tiburcio Bash, RN Outcome: Progressing   Problem: Clinical Measurements: Goal: Ability to maintain clinical measurements within normal limits will improve 09/21/2021 1116 by Tiburcio Bash, RN Outcome: Progressing 09/21/2021 1116 by Tiburcio Bash, RN Outcome: Progressing Goal: Will remain free from infection 09/21/2021 1116 by Tiburcio Bash, RN Outcome: Progressing 09/21/2021 1116 by Tiburcio Bash, RN Outcome: Progressing Goal: Diagnostic test results will improve 09/21/2021 1116 by Tiburcio Bash, RN Outcome: Progressing 09/21/2021 1116 by Tiburcio Bash, RN Outcome: Progressing Goal: Respiratory complications will improve 09/21/2021 1116 by Tiburcio Bash, RN Outcome: Progressing 09/21/2021 1116 by Tiburcio Bash, RN Outcome: Progressing Goal: Cardiovascular complication will be avoided 09/21/2021 1116 by Tiburcio Bash, RN Outcome: Progressing 09/21/2021 1116 by Tiburcio Bash, RN Outcome: Progressing   Problem: Activity: Goal: Risk for activity intolerance will decrease 09/21/2021 1116 by Tiburcio Bash, RN Outcome: Progressing 09/21/2021 1116 by Tiburcio Bash, RN Outcome: Progressing   Problem: Nutrition: Goal: Adequate nutrition will be maintained 09/21/2021 1116 by Tiburcio Bash, RN Outcome: Progressing 09/21/2021 1116  by Tiburcio Bash, RN Outcome: Progressing   Problem: Coping: Goal: Level of anxiety will decrease 09/21/2021 1116 by Tiburcio Bash, RN Outcome: Progressing 09/21/2021 1116 by Tiburcio Bash, RN Outcome: Progressing   Problem: Elimination: Goal: Will not experience complications related to bowel motility 09/21/2021 1116 by Tiburcio Bash, RN Outcome: Progressing 09/21/2021 1116 by Tiburcio Bash, RN Outcome: Progressing Goal: Will not experience complications related to urinary retention 09/21/2021 1116 by Tiburcio Bash, RN Outcome: Progressing 09/21/2021 1116 by Tiburcio Bash, RN Outcome: Progressing   Problem: Pain Managment: Goal: General experience of comfort will improve 09/21/2021 1116 by Tiburcio Bash, RN Outcome: Progressing 09/21/2021 1116 by Tiburcio Bash, RN Outcome: Progressing

## 2021-09-21 NOTE — Progress Notes (Signed)
NAME:  [name] [name]., MRN:  [id number], DOB:  Jan 01, 1951, LOS: 12 ADMISSION DATE:  09/17/2021, CONSULTATION DATE:  09/07/21 REFERRING MD:  Dr. [name], CHIEF COMPLAINT:  Resp failure   Brief HIstory  HPI obtained from medical chart review as patient is intubated and sedated on mechanical ventilation.   70 year old with poor pulmonary baseline (COPD, OSA) presenting with subacute aortic insufficiency s/p AVR showing chronic endocarditis type changes.  Postop course complicated by fluid overload and difficult to wean O2.  Pertinent  Medical History  Former Tobacco abuse (quit 2010), centrilobular emphysema, pulmonary hypertension, OSA on CPAP, PAF, CAD w/ LAD stent 2003, HLD, MSSA sepsis c/b lumbar discitis and infected left sternoclavicular joint 08/2019   Significant Hospital Events: Including procedures, antibiotic start and stop dates in addition to other pertinent events   9/8 aortic valve replacement with bioprosthetic valve and biatrial Maze procedure 9/8 ETT >> 9/8 foley >> 9/8 R IJ cordis with PA cath >> 9/8 L radial Aline >> 9/8 L femoral aline (placed in OR given variable L radial tracings)>> 9/8 parasternal pacing wires >> 9/8 mediastinal CT x 2 >>  9/8 aortic tissue cx >> 9/8 aortic tissue AFB >> 9/8 aortic fungal >> 9/9 Bcx 2 >>  9/8 ancef >>9/9 9/8 vanc >> 9/9 cefepime >> 9/15: L lung white out req bronch  9/17: off nitric, lasix ggt, self extubated, reintubated 9/20: extubated to BIPAP and diuresis Interim History / Subjective:  Precedex 0.5 for mild agitation Pt placed on BiPAP overnight. Initially tolerated HFNC this morning, 100% and 45L, then began having some difficulty with sats low 80s. Placed back on BiPAP  Still off diuretics, Cr slow improving Off pressors Objective   Blood pressure 113/60, pulse 77, temperature 98.8 F (37.1 C), temperature source Oral, resp. rate 20, height _0  (1.854 m), weight 129.6 kg, SpO2 92 %. CVP:  [4 mmHg-21 mmHg] 9 mmHg   Vent Mode: BIPAP FiO2 (%):  [100 %] 100 % Set Rate:  [12 bmp] 12 bmp PEEP:  [12 [name]20] 12 [name]20   Intake/Output Summary (Last 24 hours) at 09/21/2021 1026 Last data filed at 09/21/2021 0600 Gross per 24 hour  Intake 1708.82 ml  Output 1670 ml  Net 38.82 ml    Filed Weights   09/19/21 0233 09/20/21 0500 09/21/21 0500  Weight: 131.5 kg 130.2 kg 129.6 kg    Examination: General: Ill appearing man on HFNC then placed back on BiPAP Cardiac: RRR, no murmurs, rubs Lungs: bilateral bronchial breath sounds and crackles posteriorly in lower lung fields, improving gradually Abdomen: NT, ND, bowel sounds in all 4 quadrants Skin: warm and dry Neuro: Mental status gradually improving, occasionally follows commands, moves all 4 extremities  BUN improved 119->110 Cr improved 2.23->2.15 Na 150   Resolved Hospital Problem list   Thrombocytopenia- improving Ileus vs constipation Question HCAP, BAL w/ normal flora; secretion burden improved with abx, s/p 5 days HCAP tx ended 9/21 Question parapneumonic effusion- drained 9/19; either post AVR inflammatory or parapneumonic, cultures negative  Assessment & Plan:   Acute on chronic hypoxic respiratory failure (on home 4L O2 at home) OSA, on home CPAP.  Baseline poor pulmonary reserve making weaning difficult.  Diuresis remains limited by renal function. 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, PVR preserved on cath in July AI s/p AVR and MAZE procedure Hx PAF, CAD with LAD stent AKI  on CKD- creatinine improving, may be able to diurese if downtrend continues Class 3 obesity DM2  P: Diuretic holiday Trial of HFNC Continue low dose FWF for Na Encourage mobility as able, incentive spirometry  Remains high risk for tracheostomy  Keep in ICU Levemir increased by TCTS Will follow  Best Practice  (right click and "Reselect all SmartList Selections" daily)   Diet/type: tubefeeds DVT prophylaxis: warfarin GI prophylaxis: PPI Lines: yes and it is still needed Foley:  Yes, and it is still needed Code Status:  full code Last date of multidisciplinary goals of care discussion [wife updated at bedside on 9/22]  Patient critically ill due to respiratory failure Interventions to address this today BIPAP titration, intubation watch Risk of deterioration without these interventions is high  I personally spent 37 minutes providing critical care not including any separately billable procedures  Braulio Kiedrowski Gregary Signs [facility]  Prefer epic messenger for cross cover needs If after hours, please call E-link

## 2021-09-21 NOTE — Progress Notes (Signed)
[name] [name] for warfarin Indication:  bioprosthetic aortic valve replacement and a fib  Allergies  Allergen Reactions   Pseudoephedrine Other (See Comments)    Patient went into afib   Diltiazem Rash and Itching    Also 2019   Novocain [Procaine] Hives    Dentist appointment in 1958; since then has tolerated lidocaine and provocaine with no hives or difficulty.   Quinolones     Patient was warned about not using Cipro and similar antibiotics. Recent studies have raised concern that fluoroquinolone antibiotics could be associated with an increased risk of aortic aneurysm Fluoroquinolones have non-antimicrobial properties that might jeopardise the integrity of the extracellular matrix of the vascular wall In a  propensity score matched cohort study in [place], there was a 66% increased rate of aortic aneurysm or dissection associated with oral fluoroquinolone use, compared wit   Sulfonamide Derivatives Other (See Comments)    Childhood reaction     Patient Measurements: Height: 6' 1"  (185.4 cm) Weight: 129.6 kg (285 lb 11.5 oz) IBW/kg (Calculated) : 79.9  Vital Signs: Temp: 99.2 F (37.3 C) (09/23 1140) Temp Source: Oral (09/23 1140) BP: 129/64 (09/23 1100) Pulse Rate: 77 (09/23 0539)  Labs: Recent Labs    09/19/21 0428 09/19/21 0903 09/20/21 0428 09/20/21 0831 09/21/21 0349  HGB 11.2*   < > 10.3* 10.2* 10.1*  HCT 35.6*   < > 33.5* 30.0* 32.8*  PLT 198  --  200  --  194  LABPROT 20.5*  --  21.2*  --  22.1*  INR 1.8*  --  1.8*  --  1.9*  CREATININE 2.31*  --  2.23*  --  2.15*   < > = values in this interval not displayed.     Estimated Creatinine Clearance: 44.5 mL/min (A) (by C-G formula based on SCr of 2.15 mg/dL (H)).   Medical History: Past Medical History:  Diagnosis Date   Anxiety    Atrial fibrillation ([place]) 03/22/2009   a. s/p multiple DCCV; b. no coumadin due to low TE risk profile; c. Tikosyn Rx   COPD (chronic  obstructive pulmonary disease) (HCC)    Coronary atherosclerosis of native coronary artery 11/2002   a. s/p stent to LAD 12/03; OM2 occluded at cath 12/03; d. myoview 5/10: no ischemia;  e. echo 7/11: EF 55%, BAE, mild RVE, PASP 41-45; Myoview was in March 2013. There was no ischemia or infarction, EF 51%    Cutaneous abscess of back excluding buttocks 07/04/2014   Appears to stem from possibly a cyst very large area 6 cm contact surgeon office    Diabetes mellitus without complication ([place])    Drusen body    see opth note   Dyspnea    Dysrhythmia    Epiglottitis    w emergency nt intubation   ERECTILE DYSFUNCTION 03/22/2009   GERD 03/22/2009   Heart murmur    HYPERGLYCEMIA 04/25/2010   HYPERLIPIDEMIA 03/22/2009   Hypertension    Iliac aneurysm ([place])    2.6 to be evaluated incidental finding on CT   LATERAL EPICONDYLITIS, LEFT 10/24/2009   LIVER FUNCTION TESTS, ABNORMAL 04/25/2010   Local reaction to immunization 05/05/2012   minor resolving  zostavax    Myocardial infarction [facility]) mi2003   Numbness in left leg    foot related to back disease and surgery   Obesity, unspecified 04/24/2009   Perforated appendicitis with necrosis s/p open appendectomy 06/07/14 06/04/2014   Renal cyst    Characterized by MRI  as simple   Ruptured suppurative appendicitis    2015    SLEEP APNEA, OBSTRUCTIVE 03/22/2009   compliant with CPAP   THROMBOCYTOPENIA 08/16/2010   TOBACCO USE, QUIT 10/24/2009   ULNAR NEUROPATHY, LEFT 37/54/3606   Umbilical hernia     Medications:  Scheduled:   amiodarone  200 mg Per Tube BID   arformoterol  15 mcg Nebulization BID   aspirin  81 mg Per Tube Daily   atorvastatin  80 mg Per Tube Daily   bisacodyl  10 mg Rectal Q0600   brimonidine  1 drop Both Eyes TID   busPIRone  15 mg Per Tube BID   chlorhexidine gluconate (MEDLINE KIT)  15 mL Mouth Rinse BID   Chlorhexidine Gluconate Cloth  6 each Topical Daily   clonazePAM  1 mg Per Tube TID   docusate  200 mg Per  Tube Daily   feeding supplement (PROSource TF)  45 mL Per Tube BID   feeding supplement (VITAL 1.5 CAL)  1,000 mL Per Tube Q24H   fluticasone  2 spray Each Nare Daily   free water  200 mL Per Tube Q6H   insulin aspart  0-20 Units Subcutaneous Q4H   insulin detemir  40 Units Subcutaneous BID   mouth rinse  15 mL Mouth Rinse q12n4p   midodrine  5 mg Per Tube TID WC   pantoprazole sodium  40 mg Per Tube Daily   PARoxetine  60 mg Per Tube Daily   polyethylene glycol  17 g Per Tube Daily   revefenacin  175 mcg Nebulization Daily   senna  2 tablet Per Tube QHS   sodium chloride flush  3 mL Intravenous Q12H   Warfarin - Pharmacist Dosing Inpatient   Does not apply q1600    Assessment: 70 yo M s/p bioprosthetic AVR, atrial clip, and MAZE procedure on 9/8. Patient has a PMH of a fib on apixaban PTA and has been in a fib s/p MAZE procedure currently on an amio drip.   INR subtherapeutic 1.9  today, CBC remains stable, given subtherapeutic INR yesterday at 1.8 will give 7.5 mg tonight. ASA dropped to 81 mg so will need to get INR therapeutic.   Goal of Therapy:  INR 2-3 Monitor platelets by anticoagulation protocol: Yes   Plan:  Warfarin 7.5 mg x1 tonight Daily INR  Cathrine Muster, PharmD PGY2 Cardiology Pharmacy Resident Phone: [phone] 09/21/2021  1:56 PM  Please check [email or web address] for unit-specific pharmacy phone numbers.

## 2021-09-22 ENCOUNTER — Inpatient Hospital Stay (HOSPITAL_COMMUNITY): Payer: Medicare Other

## 2021-09-22 DIAGNOSIS — J9601 Acute respiratory failure with hypoxia: Secondary | ICD-10-CM | POA: Diagnosis not present

## 2021-09-22 DIAGNOSIS — Z515 Encounter for palliative care: Secondary | ICD-10-CM

## 2021-09-22 DIAGNOSIS — Z952 Presence of prosthetic heart valve: Secondary | ICD-10-CM | POA: Diagnosis not present

## 2021-09-22 LAB — BASIC METABOLIC PANEL
Anion gap: 6 (ref 5–15)
BUN: 100 mg/dL — ABNORMAL HIGH (ref 8–23)
CO2: 33 mmol/L — ABNORMAL HIGH (ref 22–32)
Calcium: 8.7 mg/dL — ABNORMAL LOW (ref 8.9–10.3)
Chloride: 113 mmol/L — ABNORMAL HIGH (ref 98–111)
Creatinine, Ser: 1.86 mg/dL — ABNORMAL HIGH (ref 0.61–1.24)
GFR, Estimated: 38 mL/min — ABNORMAL LOW (ref >60)
Glucose, Bld: 192 mg/dL — ABNORMAL HIGH (ref 70–99)
Potassium: 3.9 mmol/L (ref 3.5–5.1)
Sodium: 152 mmol/L — ABNORMAL HIGH (ref 135–145)

## 2021-09-22 LAB — COOXEMETRY PANEL
Carboxyhemoglobin: 1.1 % (ref 0.5–1.5)
Methemoglobin: 0.7 % (ref 0.0–1.5)
O2 Saturation: 58.1 %
Total hemoglobin: 13.6 g/dL (ref 12.0–16.0)

## 2021-09-22 LAB — BRAIN NATRIURETIC PEPTIDE: B Natriuretic Peptide: 772.9 pg/mL — ABNORMAL HIGH (ref 0.0–100.0)

## 2021-09-22 LAB — CBC
HCT: 33.1 % — ABNORMAL LOW (ref 39.0–52.0)
Hemoglobin: 9.9 g/dL — ABNORMAL LOW (ref 13.0–17.0)
MCH: 29.4 pg (ref 26.0–34.0)
MCHC: 29.9 g/dL — ABNORMAL LOW (ref 30.0–36.0)
MCV: 98.2 fL (ref 80.0–100.0)
Platelets: 212 10*3/uL (ref 150–400)
RBC: 3.37 MIL/uL — ABNORMAL LOW (ref 4.22–5.81)
RDW: 15.9 % — ABNORMAL HIGH (ref 11.5–15.5)
WBC: 12.8 10*3/uL — ABNORMAL HIGH (ref 4.0–10.5)
nRBC: 0 % (ref 0.0–0.2)

## 2021-09-22 LAB — PROTIME-INR
INR: 2.2 — ABNORMAL HIGH (ref 0.8–1.2)
Prothrombin Time: 24.7 seconds — ABNORMAL HIGH (ref 11.4–15.2)

## 2021-09-22 LAB — MAGNESIUM: Magnesium: 2.9 mg/dL — ABNORMAL HIGH (ref 1.7–2.4)

## 2021-09-22 LAB — GLUCOSE, CAPILLARY
Glucose-Capillary: 164 mg/dL — ABNORMAL HIGH (ref 70–99)
Glucose-Capillary: 139 mg/dL — ABNORMAL HIGH (ref 70–99)
Glucose-Capillary: 156 mg/dL — ABNORMAL HIGH (ref 70–99)
Glucose-Capillary: 153 mg/dL — ABNORMAL HIGH (ref 70–99)
Glucose-Capillary: 151 mg/dL — ABNORMAL HIGH (ref 70–99)

## 2021-09-22 MED ORDER — ROCURONIUM BROMIDE 10 MG/ML (PF) SYRINGE
PREFILLED_SYRINGE | INTRAVENOUS | Status: AC
  Filled 2021-09-22: qty 10

## 2021-09-22 MED ORDER — EPINEPHRINE 1 MG/10ML IJ SOSY
PREFILLED_SYRINGE | INTRAMUSCULAR | Status: AC
  Administered 2021-09-22: 1 mg
  Filled 2021-09-22: qty 10

## 2021-09-22 MED ORDER — METHYLPREDNISOLONE SODIUM SUCC 125 MG IJ SOLR
125.0000 mg | Freq: Every day | INTRAMUSCULAR | Status: DC
  Administered 2021-09-22: 125 mg via INTRAVENOUS
  Filled 2021-09-22: qty 2

## 2021-09-22 MED ORDER — EPINEPHRINE 1 MG/10ML IJ SOSY
PREFILLED_SYRINGE | INTRAMUSCULAR | Status: AC
  Filled 2021-09-22: qty 40

## 2021-09-22 MED ORDER — QUETIAPINE FUMARATE 25 MG PO TABS
25.0000 mg | ORAL_TABLET | Freq: Two times a day (BID) | ORAL | Status: DC
  Administered 2021-09-22: 25 mg

## 2021-09-22 MED ORDER — SODIUM BICARBONATE 8.4 % IV SOLN
INTRAVENOUS | Status: AC
  Filled 2021-09-22: qty 150

## 2021-09-22 MED ORDER — FENTANYL CITRATE PF 50 MCG/ML IJ SOSY
25.0000 ug | PREFILLED_SYRINGE | Freq: Once | INTRAMUSCULAR | Status: AC
  Administered 2021-09-22: 50 ug via INTRAVENOUS

## 2021-09-22 MED ORDER — FENTANYL CITRATE PF 50 MCG/ML IJ SOSY
PREFILLED_SYRINGE | INTRAMUSCULAR | Status: AC
  Administered 2021-09-22: 50 ug via INTRAVENOUS
  Filled 2021-09-22: qty 2

## 2021-09-22 MED ORDER — METOPROLOL TARTRATE 50 MG PO TABS
50.0000 mg | ORAL_TABLET | Freq: Two times a day (BID) | ORAL | Status: DC
  Administered 2021-09-22: 50 mg
  Filled 2021-09-22: qty 1

## 2021-09-22 MED ORDER — SUCCINYLCHOLINE CHLORIDE 200 MG/10ML IV SOSY
PREFILLED_SYRINGE | INTRAVENOUS | Status: AC
  Administered 2021-09-22: 200 mg
  Filled 2021-09-22: qty 10

## 2021-09-22 MED ORDER — PHENYLEPHRINE 40 MCG/ML (10ML) SYRINGE FOR IV PUSH (FOR BLOOD PRESSURE SUPPORT)
PREFILLED_SYRINGE | INTRAVENOUS | Status: AC
  Administered 2021-09-22: 400 ug
  Filled 2021-09-22: qty 10

## 2021-09-22 MED ORDER — PREDNISONE 20 MG PO TABS: 60.0000 mg | ORAL_TABLET | Freq: Every day | ORAL | Status: DC

## 2021-09-22 MED ORDER — FENTANYL 2500MCG IN NS 250ML (10MCG/ML) PREMIX INFUSION: 25.0000 ug/h | INTRAVENOUS | Status: DC

## 2021-09-22 MED ORDER — MIDAZOLAM HCL 2 MG/2ML IJ SOLN: 1.0000 mg | INTRAMUSCULAR | Status: DC | PRN

## 2021-09-22 MED ORDER — WARFARIN SODIUM 5 MG PO TABS: 5.0000 mg | ORAL_TABLET | Freq: Every day | ORAL | Status: DC

## 2021-09-22 MED ORDER — EPINEPHRINE HCL 5 MG/250ML IV SOLN IN NS
INTRAVENOUS | Status: AC
  Filled 2021-09-22: qty 250

## 2021-09-22 MED ORDER — ATROPINE SULFATE 1 MG/10ML IJ SOSY
PREFILLED_SYRINGE | INTRAMUSCULAR | Status: AC
  Filled 2021-09-22: qty 10

## 2021-09-22 MED ORDER — MIDAZOLAM HCL 2 MG/2ML IJ SOLN
INTRAMUSCULAR | Status: AC
  Administered 2021-09-22: 1 mg via INTRAVENOUS
  Filled 2021-09-22: qty 2

## 2021-09-22 MED ORDER — FENTANYL BOLUS VIA INFUSION
25.0000 ug | INTRAVENOUS | Status: DC | PRN
  Filled 2021-09-22: qty 100

## 2021-09-22 MED ORDER — FUROSEMIDE 10 MG/ML IJ SOLN
40.0000 mg | Freq: Once | INTRAMUSCULAR | Status: AC
  Administered 2021-09-22: 40 mg via INTRAVENOUS
  Filled 2021-09-22: qty 4

## 2021-09-22 MED ORDER — ETOMIDATE 2 MG/ML IV SOLN
INTRAVENOUS | Status: AC
  Administered 2021-09-22: 20 mg
  Filled 2021-09-22: qty 20

## 2021-09-22 MED ORDER — FENTANYL CITRATE PF 50 MCG/ML IJ SOSY: 25.0000 ug | PREFILLED_SYRINGE | INTRAMUSCULAR | Status: DC | PRN

## 2021-09-22 MED ORDER — FREE WATER
200.0000 mL | Status: DC
  Administered 2021-09-22: 200 mL

## 2021-09-24 ENCOUNTER — Ambulatory Visit: Payer: Medicare Other | Admitting: Physician Assistant

## 2021-09-25 ENCOUNTER — Other Ambulatory Visit: Payer: Self-pay | Admitting: Internal Medicine

## 2021-09-25 NOTE — Progress Notes (Signed)
Heart valve pcr from [facility] Collected 9/08 Received 9/15 Resulted 9/20   No bacterial or mycobacterial dna detected

## 2021-09-27 MED FILL — Medication: Qty: 1 | Status: AC

## 2021-09-28 NOTE — Assessment & Plan Note (Addendum)
Patient is compliant with CPAP but still having some events, mostly central apneas likely d.t underlying cardiac disease. Will hold off on titration study until after cardiac surgery  Airview download 07/11/21-08/09/21 28/30 days used (93%); 21 days used (70%) Pressure- 10-20cm h20 (14.9cm h20-95%) Airleaks- 19.6L/min (95%) Events per hour - AI 6.4; HI 7.8 Apnea index- Central 4.4; Obstructive 1.2 AHI 14.2

## 2021-09-28 NOTE — Assessment & Plan Note (Addendum)
Pulmonary fuctoin testing looks stable, diffusion capacity is some worse d/t emphysema/ fibrosis or scarring   . PFT 08/09/21- FVC 3.42 (75%), FEV1 2. 60 (77%), ratio 76, DLCOunc 10/29 (38%)

## 2021-09-29 NOTE — Progress Notes (Signed)
NAME:  [name] Kueker., MRN:  [id number], DOB:  [birth year], LOS: 77 ADMISSION DATE:  09/18/2021, CONSULTATION DATE:  09/07/21 REFERRING MD:  Dr. [name], CHIEF COMPLAINT:  Resp failure   Brief HIstory  HPI obtained from medical chart review as patient is intubated and sedated on mechanical ventilation.   70 year old with poor pulmonary baseline (COPD, OSA) presenting with subacute aortic insufficiency s/p AVR showing chronic endocarditis type changes.  Postop course complicated by fluid overload and difficult to wean O2.  Pertinent  Medical History  Former Tobacco abuse (quit 2010), centrilobular emphysema, pulmonary hypertension, OSA on CPAP, PAF, CAD w/ LAD stent 2003, HLD, MSSA sepsis c/b lumbar discitis and infected left sternoclavicular joint 08/2019   Significant Hospital Events: Including procedures, antibiotic start and stop dates in addition to other pertinent events   9/8 aortic valve replacement with bioprosthetic valve and biatrial Maze procedure 9/8 ETT >> 9/8 foley >> 9/8 R IJ cordis with PA cath >> 9/8 L radial Aline >> 9/8 L femoral aline (placed in OR given variable L radial tracings)>> 9/8 parasternal pacing wires >> 9/8 mediastinal CT x 2 >>  9/8 aortic tissue cx >> 9/8 aortic tissue AFB >> 9/8 aortic fungal >> 9/9 Bcx 2 >>  9/8 ancef >>9/9 9/8 vanc >> 9/9 cefepime >> 9/15: L lung white out req bronch  9/17: off nitric, lasix ggt, self extubated, reintubated 9/20: extubated to BIPAP and diuresis Interim History / Subjective:   Still not tolerating coming off BIPAP without significant desaturation.   Objective   Blood pressure 130/78, pulse 82, temperature 98.6 F (37 C), temperature source Oral, resp. rate (!) 25, height _0  (1.854 m), weight 128.5 kg, SpO2 (!) 86 %. CVP:  [3 mmHg-23 mmHg] 10 mmHg  Vent Mode: BIPAP;PCV FiO2 (%):  [75 %-100 %] 100 % Set Rate:  [8 bmp] 8 bmp PEEP:  [10 [name]20-12 [name]20] 10 [name]20   Intake/Output Summary (Last 24 hours)  at 06-Oct-2021 1315 Last data filed at Oct 06, 2021 1200 Gross per 24 hour  Intake 2811.94 ml  Output 2275 ml  Net 536.94 ml    Filed Weights   09/20/21 0500 09/21/21 0500 10-06-2021 0350  Weight: 130.2 kg 129.6 kg 128.5 kg    Examination: General: Ill appearing man on HFNC then placed back on BiPAP Cardiac: RRR, no murmurs, rubs Lungs: bilateral bronchial breath sounds and crackles posteriorly in lower lung fields, improving gradually Abdomen: NT, ND, bowel sounds in all 4 quadrants Skin: warm and dry Neuro: Mental status gradually improving, occasionally follows commands, moves all 4 extremities    Resolved Hospital Problem list   Thrombocytopenia- improving Ileus vs constipation Question HCAP, BAL w/ normal flora; secretion burden improved with abx, s/p 5 days HCAP tx ended 9/21 Question parapneumonic effusion- drained 9/19; either post AVR inflammatory or parapneumonic, cultures negative  Assessment & Plan:   Acute on chronic hypoxic respiratory failure (on home 4L O2 at home) OSA, on home CPAP.  Baseline poor pulmonary reserve making weaning difficult.  Diuresis remains limited by renal function. 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  - Stopped amiodarone as patient is  at high risk for acute amiodarone lung toxicity given poor baseline lung function and recent CPB ( ' second-hit '), will start corticosteroids.  - Will substitute metoprolol for amiodarone to help maintain SR - Continue BIPAP with daily trials off. Remains high risk for intubation and would be a difficult wean if he were to require  reintubation.  - Anticipate increase insulin requirement with steroids.   Best Practice (right click and "Reselect all SmartList Selections" daily)   Diet/type: tubefeeds DVT prophylaxis: warfarin GI prophylaxis: PPI Lines: yes and it is still needed Foley:  Yes, and it is still needed Code Status:  full code Last date of multidisciplinary goals of care discussion [wife updated at bedside on 9/22]  CRITICAL CARE Performed by: [name]   Total critical care time: 35 minutes  Critical care time was exclusive of separately billable procedures and treating other patients.  Critical care was necessary to treat or prevent imminent or life-threatening deterioration.  Critical care was time spent personally by me on the following activities: development of treatment plan with patient and/or surrogate as well as nursing, discussions with consultants, evaluation of patient's response to treatment, examination of patient, obtaining history from patient or surrogate, ordering and performing treatments and interventions, ordering and review of laboratory studies, ordering and review of radiographic studies, pulse oximetry, re-evaluation of patient's condition and participation in multidisciplinary rounds.  [name], MD [facility] ICU Physician [place]  Pager: [phone] Mobile: [phone] After hours: [phone].

## 2021-09-29 NOTE — Progress Notes (Addendum)
Update 17:22 At 16:20 first drugs for intubation administered for decreasing O2 sats.  Pt successfully intubated, required atropine for HR of 40, BP became <60 sys, phenylephrine push administered per verbal order, pulse unable to obtained and CPR started, see provider note for details.   11:48am On 9/22 pt tolerated HFNC during the day at 100% and 40L for around 10 hours with sats between 87-92% before requiring a return to the bipap at 75-100% for the night.  On 9/23 pt began the day tolerating HFNC at 100% and 40L with intermittent use of the NRB for recovery with sats in the 85-90% range before requiring bipap again in the afternoon and precedex gtt for compliance. Pt could not recover sats out of the 70s on HFNC and used the bipap overnight at 100%.  Today 9/24 pt was tried on HFNC 100% and 40L after using the bipap at 100% overnight and pt had sats of 75%.  Pt encouraged to deep breath and cough with help from oral suction to remove mucus, family friend at the bedside to assist with encouragement, bed percussion used and prn nebulizer given by RT.  Pt sats briefly improved to 83% but stabilized throughout the event in the high 70s with a resp rate above 25, labored breathing using accessory muscles.  Pt subsequently placed back on the bipap at 100%.  This trial on the HFNC lasted nearly 45 minutes.  Family friend at the bedside with pt's wife on the phone inquiring about palliative care and defining goals of care, and continues to encourage pt to breath deep and comply with treatment. Dr [name] made aware.  Continuing to assess and monitor.

## 2021-09-29 NOTE — Progress Notes (Signed)
RT called to bedside by RN due to pt desat. Pt placed on heated HFNC by RN 40L 100%,RT gave pt neb treatment. Pts sat dropped into low 70s. RT placed pt back on Bipap, pt tolerating well at this time, Spo2 87%, RN aware, RT will continue to monitor.

## 2021-09-29 NOTE — Progress Notes (Signed)
Time of death 1730, pronounced by Mallie Mussel, RN and Gerald Stabs, RN, no respirations and no heart sounds.

## 2021-09-29 NOTE — Progress Notes (Signed)
Patient ID: [name] [name]., male   DOB: Jun 09, 1951, 70 y.o.   MRN: [id number]     Advanced Heart Failure Rounding Note  PCP-Cardiologist: [name], MD   Subjective:    9/11: Echo EF 40-45% RV ok. RVSP 22.8  9/14 - 9/15 Developed hypoxia, CXR with left lung white-out, underwent bronchoscopy with treatment of mucus plug. Placed on norepi overnight.  9/16: iNO stopped.  9/17: Self-extubated, had to be reintubated.  9/19: Left thoracentesis 9/20: Extubated to BiPAP    Off pressors Co-ox stable 58% .   Off diuretics for the last few days with worsening renal function. Weight down another 2 pounds.   Na 152 -->on free water.   INR 2.2   Back on Bipap. Awake. Follows commands    Objective:   Weight Range: 128.5 kg Body mass index is 37.38 kg/m.   Vital Signs:   Temp:  [97.5 F (36.4 C)-99.2 F (37.3 C)] 97.5 F (36.4 C) (09/24 0400) Pulse Rate:  [55-90] 55 (09/23 2300) Resp:  [15-28] 22 (09/24 0700) BP: (99-139)/(55-80) 119/70 (09/24 0700) SpO2:  [76 %-98 %] 96 % (09/24 0746) FiO2 (%):  [90 %-100 %] 100 % (09/24 0746) Weight:  [128.5 kg] 128.5 kg (09/24 0350) Last BM Date: 09/21/21  Weight change: Filed Weights   09/20/21 0500 09/21/21 0500 Sep 23, 2021 0350  Weight: 130.2 kg 129.6 kg 128.5 kg    Intake/Output:   Intake/Output Summary (Last 24 hours) at 09-23-21 0900 Last data filed at Sep 23, 2021 0700 Gross per 24 hour  Intake 2861.94 ml  Output 2150 ml  Net 711.94 ml       Physical Exam   CVP 9-10 General:  Awake on bipap HEENT: normal Neck: supple. no JVD. Carotids 2+ bilat; no bruits. No lymphadenopathy or thryomegaly appreciated. Cor: PMI nondisplaced. Regular rate & rhythm. No rubs, gallops or murmurs. Lungs: coarse  Abdomen: obese soft, nontender, nondistended. No hepatosplenomegaly. No bruits or masses. Good bowel sounds. Extremities: no cyanosis, clubbing, rash, edema Neuro: awake follows commands   Telemetry   Sinus 50-60s  Personally reviewed  Labs    CBC Recent Labs    09/21/21 0349 09-23-21 0342  WBC 12.0* 12.8*  HGB 10.1* 9.9*  HCT 32.8* 33.1*  MCV 98.5 98.2  PLT 194 449    Basic Metabolic Panel Recent Labs    09/21/21 0349 23-Sep-2021 0342  NA 150* 152*  K 4.3 3.9  CL 108 113*  CO2 34* 33*  GLUCOSE 227* 192*  BUN 110* 100*  CREATININE 2.15* 1.86*  CALCIUM 9.0 8.7*  MG 3.0* 2.9*    Liver Function Tests No results for input(s): AST, ALT, ALKPHOS, BILITOT, PROT, ALBUMIN in the last 72 hours.  No results for input(s): LIPASE, AMYLASE in the last 72 hours. Cardiac Enzymes No results for input(s): CKTOTAL, CKMB, CKMBINDEX, TROPONINI in the last 72 hours.  BNP: BNP (last 3 results) Recent Labs    2021/09/23 0342  BNP 772.9*    ProBNP (last 3 results) No results for input(s): PROBNP in the last 8760 hours.   D-Dimer No results for input(s): DDIMER in the last 72 hours. Hemoglobin A1C No results for input(s): HGBA1C in the last 72 hours. Fasting Lipid Panel No results for input(s): CHOL, HDL, LDLCALC, TRIG, CHOLHDL, LDLDIRECT in the last 72 hours. Thyroid Function Tests No results for input(s): TSH, T4TOTAL, T3FREE, THYROIDAB in the last 72 hours.  Invalid input(s): FREET3  Other results:   Imaging    No results found.  Medications:     Scheduled Medications:  amiodarone  200 mg Per Tube BID   arformoterol  15 mcg Nebulization BID   aspirin  81 mg Per Tube Daily   atorvastatin  80 mg Per Tube Daily   bisacodyl  10 mg Oral Daily   brimonidine  1 drop Both Eyes TID   busPIRone  15 mg Per Tube BID   chlorhexidine gluconate (MEDLINE KIT)  15 mL Mouth Rinse BID   Chlorhexidine Gluconate Cloth  6 each Topical Daily   clonazePAM  1 mg Per Tube TID   docusate  200 mg Per Tube Daily   feeding supplement (PROSource TF)  45 mL Per Tube BID   feeding supplement (VITAL 1.5 CAL)  1,000 mL Per Tube Q24H   fluticasone  2 spray Each Nare Daily   free water  200 mL Per Tube  Q6H   insulin aspart  0-20 Units Subcutaneous Q4H   insulin detemir  40 Units Subcutaneous BID   mouth rinse  15 mL Mouth Rinse q12n4p   midodrine  5 mg Per Tube TID WC   pantoprazole sodium  40 mg Per Tube Daily   PARoxetine  60 mg Per Tube Daily   polyethylene glycol  17 g Per Tube Daily   revefenacin  175 mcg Nebulization Daily   senna  2 tablet Per Tube QHS   sodium chloride flush  3 mL Intravenous Q12H   Warfarin - Pharmacist Dosing Inpatient   Does not apply q1600    Infusions:  sodium chloride 10 mL/hr at 09/13/21 0816   sodium chloride     sodium chloride Stopped (09/17/21 1700)   dexmedetomidine (PRECEDEX) IV infusion 0.5 mcg/kg/hr (10-05-21 0700)   lactated ringers Stopped (09/19/21 1250)   lactated ringers 20 mL/hr at 09/10/21 1517    PRN Medications: sodium chloride, dextrose, fentaNYL, levalbuterol, morphine injection, ondansetron (ZOFRAN) IV, sodium chloride flush    Patient Profile   70 y.o. with history of COPD/smoking, chronic severe aortic insufficiency, paroxysmal atrial fibrillation, CAD, type 2 diabetes, and CKD stage 3.  Patient had PCI to LAD in 12/03, LHC in 7/22 showed only mild CAD.  He has paroxysmal atrial fibrillation and had been maintained on Tikosyn.  He had not been ablated due to obesity.  Patient had lumbar discitis and left sternoclavicular joint infection in 9/20, had MSSA.  He had I&D left [place] joint and IV abx.  Echo at the time showed no vegetation and trivial AI.    HF team consulted for shock/pulmonary hypertension.   Assessment/Plan   1. Aortic insufficiency: TEE pre-op with severe AI, tricuspid aortic valve.  The LV was moderately dilated.  Now s/p bioprosthetic AVR.  The native valve was perforated with possible vegetation.  Suspect he did not have acute endocarditis, tissue cultures remain negative.  Rheumatological serologic workup negative.  - continue supportive care 2. Shock: Post-op.  Suspect mixed picture with RV  dysfunction/severe pulmonary hypertension as well as likely distributive/septic component with low SVR.  Remains afebrile.  Now off NE and milrinone. Co-ox 58%.  SBP 110-120 - Continue midodrine 5 mg tid.   - Completed antibiotics.   3.  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  Post-op echo with LV down 40-45% (post-correction of severe AI), normal RV, normal bioprosthetic aortic valve.  Good diuresis with Lasix gtt. Lasix stopped  9/21 after bump in SCr and BUN.  Suspect he has some underlying RV dysfunction with pulmonary hypertension, probably needs to run a CVP  8-10 range. Weight down. Creatinine down 2.2 -> 1.86 - continue to hold diuretics today. 4.  Pulmonary hypertension: Suspect mixed group 2/3 pulmonary hypertension + high output.  Has significant lung parenchymal disease, CTA chest in 6/22 with emphysema as well as areas of fibrosis, as well as OSA and ?OHS.  Pre-op was on CPAP and 4L home oxygen.  Initially, peri-operative PA pressure was very high, in the 90/45 range. Now off iNO.  5. Acute on chronic hypoxemic respiratory failure: Intubated post-op.  Baseline COPD + OSA + ?OHS and on home oxygen 4 liters. Now with post-op volume overload as well. 9/15 mucus plug with left lung white-out, S/P Bronch with removal of plug - sputum cx pending. Repeat CXR post-bronch significantly improved.  Self-extubated 9/17, had to be reintubated. 9/19 had thoracentesis on left.  Extubated to BiPAP 9/20. - Now back on bipap. CCM managing  - Brovana and Yupelri ordered  -Continue to hold diuretics per above - Completed abx.  6. ID: Cultures including tissue culture from valve so far negative.  9/20 MSSA discitis.  ID following. Concern for possible distributive shock component. Blood cultures from 9/9 with NGTD. WBC normal, afebrile. PCT 0.33.  - completed 5  days course of vancomycin/cefepime for HCAP.  7. AKI on CKD stage 3: Creatinine improving off diuretics, 1.89>>2.31>>2.2>>2.15>> 1.86 - BUN trending down 103>>119>>110>>108>>100 - Following commands.   - Continue to hold diuretics.  - continue midodrine for BP support/ renal perfusion, keep MAPs ~70  - monitor for uremic symptoms 8. Atrial fibrillation: Paroxysmal.  On Tikosyn pre-op.  Had Maze procedure. In AF currently.  - Remains in NSR.  - Continue PO Amiodarone  - Continue warfarin. INR 2.2 9. Hypernatremia - Na 150-> 152 - Continue free water boluses. Will d/w CCM about increasing    Glori Bickers, MD 20-Oct-2021 9:00 AM

## 2021-09-29 NOTE — Progress Notes (Addendum)
   Palliative Medicine Inpatient Follow Up Note  I met with Dr. Lynetta Mare to better understand the context of of the consult for [name] [name]. He shared that he would first like to check in with the cardiac surgery team prior to [place] proceeding with family conversation.   Palliative medicine will be on standby when a decision is made as to when we should meet with [name]' family.  We will, for the time being remove the consult, but please re-consult [place] when needed.  No Charge ______________________________________________________________________________________ [place] Team Team Cell Phone: [phone] Please utilize secure chat with additional questions, if there is no response within 30 minutes please call the above phone number  Palliative Medicine Team providers are available by phone from 7am to 7pm daily and can be reached through the team cell phone.  Should this patient require assistance outside of these hours, please call the patient's attending physician.

## 2021-09-29 NOTE — Progress Notes (Signed)
PT Cancellation Note  Patient Details Name: [name] [name]. MRN: [id number] DOB: 07/24/1951   Cancelled Treatment:    Reason Eval/Treat Not Completed: Medical issues which prohibited therapy. PT following from a distance today as pt with continued respiratory distress. Given code and CPR this afternoon, will discontinue PT order.   West Carbo, PT, DPT   Acute Rehabilitation Department Pager #: [phone]   Sandra Cockayne 2021/09/28, 5:20 PM

## 2021-09-29 NOTE — Progress Notes (Signed)
   10-19-21 1700  Clinical Encounter Type  Visited With Patient and family together  Visit Type Follow-up;Psychological support;Spiritual support;Code  Referral From Nurse  Consult/Referral To Chaplain  Spiritual Encounters  Spiritual Needs Sacred text;Prayer;Emotional;Grief support  Stress Factors  Patient Stress Factors Loss of control;Major life changes  Family Stress Factors Exhausted;Family relationships;Major life changes  CODE request for Chaplain services.  Upon arrival Chaplain met with close friend who had spent the day visiting the patient.  Patient was in CODE Status and friend distressed.  Friend contacted wife and son via telephone and Chaplain met them in hallway and escorted wife into bedside.  Wife exhibited anticipatory grief and strong emotions, while Chaplain provided comfort and counsel.  At wife's request, Chaplain anointed patient with oil and all prayed The Lords Prayer for patient.   No extensional distress exhibited but appropriate grief and shock.  Chaplain provided  comfort. Ended visit with a departing blessing.  Will remain available as needed.

## 2021-09-29 NOTE — Progress Notes (Signed)
Pt on Bipap 100%, resp 22/min, O2 sats 86%, CCM at the bedside

## 2021-09-29 NOTE — Progress Notes (Signed)
[address]       [place] [address]             [phone]                 16 Days Post-Op Procedure(s) (LRB): AORTIC VALVE REPLACEMENT (AVR) USING INSPIRIS AORTIC VALVE 27 MM (N/A) MAZE (N/A) TRANSESOPHAGEAL ECHOCARDIOGRAM (TEE) (N/A) APPLICATION OF CELL SAVER CLIPPING OF ATRIAL APPENDAGE USING ATRICLIP PRO 240   Events: No events Family has concerns, and would like a palliative care consult _______________________________________________________________ Vitals: BP 129/66   Pulse (!) 58   Temp 98 F (36.7 C) (Axillary)   Resp (!) 25   Ht 6\' 1"  (1.854 m)   Wt 128.5 kg   SpO2 (!) 82%   BMI 37.38 kg/m  Filed Weights   09/20/21 0500 09/21/21 0500 October 20, 2021 0350  Weight: 130.2 kg 129.6 kg 128.5 kg     - Neuro: arousable  - Cardiovascular: sinus  Drips: none.   CVP:  [3 mmHg-19 mmHg] 11 mmHg  - Pulm: coarse Vent Mode: BIPAP;PCV FiO2 (%):  [75 %-100 %] 90 % Set Rate:  [8 bmp] 8 bmp PEEP:  [10 [name]20-12 [name]20] 10 [name]20  ABG    Component Value Date/Time   PHART 7.401 09/20/2021 0831   PCO2ART 58.5 (H) 09/20/2021 0831   PO2ART 122 (H) 09/20/2021 0831   HCO3 36.4 (H) 09/20/2021 0831   TCO2 38 (H) 09/20/2021 0831   ACIDBASEDEF 1.0 09/12/2021 0322   O2SAT 58.1 2021/10/20 0342    - Abd: soft - Extremity: warm  .Intake/Output      09/23 0701 09/24 0700 09/24 0701 09/25 0700   P.O.     I.V. (mL/kg) 236.9 (1.8)    NG/GT 2875 250   Total Intake(mL/kg) 3111.9 (24.2) 250 (1.9)   Urine (mL/kg/hr) 2150 (0.7)    Emesis/NG output 0    Stool 0    Total Output 2150    Net +961.9 +250        Stool Occurrence 1 x       _______________________________________________________________ Labs: CBC Latest Ref Rng & Units 10/20/21 09/21/2021 09/20/2021  WBC 4.0 - 10.5 K/uL 12.8(H) 12.0(H) -  Hemoglobin 13.0 - 17.0 g/dL 9.9(L) 10.1(L) 10.2(L)  Hematocrit 39.0 - 52.0 % 33.1(L) 32.8(L) 30.0(L)  Platelets 150 - 400 K/uL 212 194 -   CMP Latest  Ref Rng & Units 10/20/21 09/21/2021 09/20/2021  Glucose 70 - 99 mg/dL 192(H) 227(H) -  BUN 8 - 23 mg/dL 100(H) 110(H) -  Creatinine 0.61 - 1.24 mg/dL 1.86(H) 2.15(H) -  Sodium 135 - 145 mmol/L 152(H) 150(H) 150(H)  Potassium 3.5 - 5.1 mmol/L 3.9 4.3 4.1  Chloride 98 - 111 mmol/L 113(H) 108 -  CO2 22 - 32 mmol/L 33(H) 34(H) -  Calcium 8.9 - 10.3 mg/dL 8.7(L) 9.0 -  Total Protein 6.5 - 8.1 g/dL - - -  Total Bilirubin 0.3 - 1.2 mg/dL - - -  Alkaline Phos 38 - 126 U/L - - -  AST 15 - 41 U/L - - -  ALT 0 - 44 U/L - - -    CXR: -  _______________________________________________________________  Assessment and Plan: POD 16 s/p AVR, MAZE, atriclip.  Slowly improving  Neuro: mental status improving.   CV: stable.   Pulm: remains on bipap.  Will attempt to transition to hiflow Victor Renal: creat trending down GI: on tubefeeds Heme: stable ID: afebrile Endo: SSI Dispo: continue ICU care.  Pt  is improving gradually.  Had discussion with family friend at the bedside, and explained that palliative consult is not currently needed.     Lajuana Matte 10/16/2021 10:38 AM

## 2021-09-29 NOTE — Plan of Care (Signed)
  Problem: Education: Goal: Knowledge of General Education information will improve Description: Including pain rating scale, medication(s)/side effects and non-pharmacologic comfort measures Outcome: Progressing   Problem: Health Behavior/Discharge Planning: Goal: Ability to manage health-related needs will improve Outcome: Progressing   Problem: Clinical Measurements: Goal: Ability to maintain clinical measurements within normal limits will improve Outcome: Progressing Goal: Will remain free from infection Outcome: Progressing Goal: Diagnostic test results will improve Outcome: Progressing Goal: Respiratory complications will improve Outcome: Progressing Goal: Cardiovascular complication will be avoided Outcome: Progressing   Problem: Activity: Goal: Risk for activity intolerance will decrease Outcome: Progressing   Problem: Nutrition: Goal: Adequate nutrition will be maintained Outcome: Progressing   Problem: Coping: Goal: Level of anxiety will decrease Outcome: Progressing   Problem: Elimination: Goal: Will not experience complications related to bowel motility Outcome: Progressing Goal: Will not experience complications related to urinary retention Outcome: Progressing   Problem: Safety: Goal: Ability to remain free from injury will improve Outcome: Progressing   Problem: Skin Integrity: Goal: Risk for impaired skin integrity will decrease Outcome: Progressing   Problem: Education: Goal: Will demonstrate proper wound care and an understanding of methods to prevent future damage Outcome: Progressing Goal: Knowledge of disease or condition will improve Outcome: Progressing Goal: Knowledge of the prescribed therapeutic regimen will improve Outcome: Progressing Goal: Individualized Educational Video(s) Outcome: Progressing

## 2021-09-29 NOTE — Procedures (Signed)
Intubation Procedure Note  [name] [name]  [id number]  07-14-1951  Date:Oct 20, 2021  Time:5:10 PM   Provider Performing:Shine Mikes    Procedure: Intubation (64314)  Indication(s) Respiratory Failure  Consent Unable to obtain consent due to emergent nature of procedure.   Anesthesia Etomidate, Versed, Fentanyl, and Succinylcholine   Time Out Verified patient identification, verified procedure, site/side was marked, verified correct patient position, special equipment/implants available, medications/allergies/relevant history reviewed, required imaging and test results available.   Sterile Technique Usual hand hygeine, masks, and gloves were used   Procedure Description Patient positioned in bed supine.  Sedation given as noted above.  Patient was intubated with endotracheal tube using Glidescope.  View was Grade 1 full glottis .  Number of attempts was 1.  Colorimetric CO2 detector was consistent with tracheal placement.  Posterior oropharynx edematous and caked in dry bloody secretions. Few visible landmarks.   Complications/Tolerance None; patient tolerated the procedure well. Chest X-ray is ordered to verify placement.   EBL Minimal   Specimen(s) None  [name] Brood, MD [facility] ICU Physician [place]  Pager: [phone] Or Epic Secure Chat After hours: [phone].  10-20-21, 5:13 PM

## 2021-09-29 NOTE — Plan of Care (Signed)
Stable during shift.   Rested on BiPAP, RT attempted to wean FiO2 down to 90% but sats decreased to 86-87%, FiO2 back up to 100% with stable sats. BNP elevated 772.9, fine bibasilar crackles. CVP 7-10. Low dose precedex gtt for rest, follows commands, moves all extremities, calm and cooperative with the occasional episode of anxiety but responded well to reassurance and encouragement. Coox 58.1 this am.   Condom cath, voiding well, > 2 L/24 hr. 2 small BMs.  SR/SB, stable BP, on midodrine per tube.   Tolerating tube feeds. Na 152, continue free water flushes via cortrak.   Turning q 2, skin tears/abrasions healing well.       Problem: Clinical Measurements: Goal: Ability to maintain clinical measurements within normal limits will improve Outcome: Progressing Goal: Will remain free from infection Outcome: Progressing Goal: Cardiovascular complication will be avoided Outcome: Progressing   Problem: Activity: Goal: Risk for activity intolerance will decrease Outcome: Progressing   Problem: Nutrition: Goal: Adequate nutrition will be maintained Outcome: Progressing   Problem: Coping: Goal: Level of anxiety will decrease Outcome: Progressing   Problem: Elimination: Goal: Will not experience complications related to bowel motility Outcome: Progressing Goal: Will not experience complications related to urinary retention Outcome: Progressing   Problem: Pain Managment: Goal: General experience of comfort will improve Outcome: Progressing   Problem: Safety: Goal: Ability to remain free from injury will improve Outcome: Progressing   Problem: Skin Integrity: Goal: Risk for impaired skin integrity will decrease Outcome: Progressing   Problem: Clinical Measurements: Goal: Respiratory complications will improve Outcome: Not Progressing

## 2021-09-29 NOTE — Progress Notes (Signed)
[place] for warfarin Indication:  bioprosthetic aortic valve replacement and a fib  Allergies  Allergen Reactions   Pseudoephedrine Other (See Comments)    Patient went into afib   Diltiazem Rash and Itching    Also 2019   Novocain [Procaine] Hives    Dentist appointment in 1958; since then has tolerated lidocaine and provocaine with no hives or difficulty.   Quinolones     Patient was warned about not using Cipro and similar antibiotics. Recent studies have raised concern that fluoroquinolone antibiotics could be associated with an increased risk of aortic aneurysm Fluoroquinolones have non-antimicrobial properties that might jeopardise the integrity of the extracellular matrix of the vascular wall In a  propensity score matched cohort study in [place], there was a 66% increased rate of aortic aneurysm or dissection associated with oral fluoroquinolone use, compared wit   Sulfonamide Derivatives Other (See Comments)    Childhood reaction     Patient Measurements: Height: 6' 1" (185.4 cm) Weight: 128.5 kg (283 lb 4.7 oz) IBW/kg (Calculated) : 79.9  Vital Signs: Temp: 98.6 F (37 C) (09/24 1143) Temp Source: Oral (09/24 1143) BP: 130/78 (09/24 1200) Pulse Rate: 82 (09/24 1143)  Labs: Recent Labs    09/20/21 0428 09/20/21 0831 09/21/21 0349 Oct 15, 2021 0342  HGB 10.3* 10.2* 10.1* 9.9*  HCT 33.5* 30.0* 32.8* 33.1*  PLT 200  --  194 212  LABPROT 21.2*  --  22.1* 24.7*  INR 1.8*  --  1.9* 2.2*  CREATININE 2.23*  --  2.15* 1.86*     Estimated Creatinine Clearance: 51.2 mL/min (A) (by C-G formula based on SCr of 1.86 mg/dL (H)).   Medical History: Past Medical History:  Diagnosis Date   Anxiety    Atrial fibrillation ([place]) 03/22/2009   a. s/p multiple DCCV; b. no coumadin due to low TE risk profile; c. Tikosyn Rx   COPD (chronic obstructive pulmonary disease) (HCC)    Coronary atherosclerosis of native coronary artery 11/2002    a. s/p stent to LAD 12/03; OM2 occluded at cath 12/03; d. myoview 5/10: no ischemia;  e. echo 7/11: EF 55%, BAE, mild RVE, PASP 41-45; Myoview was in March 2013. There was no ischemia or infarction, EF 51%    Cutaneous abscess of back excluding buttocks 07/04/2014   Appears to stem from possibly a cyst very large area 6 cm contact surgeon office    Diabetes mellitus without complication ([place])    Drusen body    see opth note   Dyspnea    Dysrhythmia    Epiglottitis    w emergency nt intubation   ERECTILE DYSFUNCTION 03/22/2009   GERD 03/22/2009   Heart murmur    HYPERGLYCEMIA 04/25/2010   HYPERLIPIDEMIA 03/22/2009   Hypertension    Iliac aneurysm ([place])    2.6 to be evaluated incidental finding on CT   LATERAL EPICONDYLITIS, LEFT 10/24/2009   LIVER FUNCTION TESTS, ABNORMAL 04/25/2010   Local reaction to immunization 05/05/2012   minor resolving  zostavax    Myocardial infarction [facility]) mi2003   Numbness in left leg    foot related to back disease and surgery   Obesity, unspecified 04/24/2009   Perforated appendicitis with necrosis s/p open appendectomy 06/07/14 06/04/2014   Renal cyst    Characterized by MRI as simple   Ruptured suppurative appendicitis    2015    SLEEP APNEA, OBSTRUCTIVE 03/22/2009   compliant with CPAP   THROMBOCYTOPENIA 08/16/2010   TOBACCO USE, QUIT 10/24/2009  ULNAR NEUROPATHY, LEFT 89/38/1017   Umbilical hernia     Medications:  Scheduled:   arformoterol  15 mcg Nebulization BID   aspirin  81 mg Per Tube Daily   atorvastatin  80 mg Per Tube Daily   bisacodyl  10 mg Oral Daily   brimonidine  1 drop Both Eyes TID   busPIRone  15 mg Per Tube BID   chlorhexidine gluconate (MEDLINE KIT)  15 mL Mouth Rinse BID   Chlorhexidine Gluconate Cloth  6 each Topical Daily   clonazePAM  1 mg Per Tube TID   docusate  200 mg Per Tube Daily   feeding supplement (PROSource TF)  45 mL Per Tube BID   feeding supplement (VITAL 1.5 CAL)  1,000 mL Per Tube Q24H    fluticasone  2 spray Each Nare Daily   free water  200 mL Per Tube Q4H   insulin aspart  0-20 Units Subcutaneous Q4H   insulin detemir  40 Units Subcutaneous BID   mouth rinse  15 mL Mouth Rinse q12n4p   methylPREDNISolone (SOLU-MEDROL) injection  125 mg Intravenous Daily   Followed by   Derrill Memo ON 09/25/2021] predniSONE  60 mg Oral Q breakfast   metoprolol tartrate  50 mg Per Tube BID   midodrine  5 mg Per Tube TID WC   pantoprazole sodium  40 mg Per Tube Daily   PARoxetine  60 mg Per Tube Daily   polyethylene glycol  17 g Per Tube Daily   QUEtiapine  25 mg Per Tube BID   revefenacin  175 mcg Nebulization Daily   senna  2 tablet Per Tube QHS   sodium chloride flush  3 mL Intravenous Q12H   Warfarin - Pharmacist Dosing Inpatient   Does not apply q1600    Assessment: 70 yo M s/p bioprosthetic AVR, atrial clip, and MAZE procedure on 9/8. Patient has a PMH of a fib on apixaban PTA and has been in a fib s/p MAZE procedure currently on an amio drip.   INR therapeutic at 2.2 today, CBC remains stable, ASA dropped to 81 mg daily   Goal of Therapy:  INR 2-3 Monitor platelets by anticoagulation protocol: Yes   Plan:  Warfarin 68m daily  Daily INR   LBonnita NasutiPharm.D. CPP, BCPS Clinical Pharmacist [phone] 10, 20222:58 PM    Please check [email or web address] for unit-specific pharmacy phone numbers.

## 2021-09-29 NOTE — Code Documentation (Signed)
Critical Care Attending CPR note:   70 year old man with severe hypoxic respiratory failure following AVR due to underlying chronic respiratory failure.   Prolonged period on BiPAP. Gradual worsening in O2 saturations today. Attempted diuresis and initiation of steroids for potential amiodarone lung toxicity.   Began to desaturate into 60's. Emergently intubated without difficulty with bilateral air entry and increasing saturations before developing bradycardia and hypotension.   See CPR Record for details.   Received 3 rounds of CPR with atropine, epinephrine and bicarbonate without sustained ROSC.  Discussed with Dr [name], opted not to resume CPR after 68min attempt at resuscitation.  [name], MD [facility] ICU Physician [place]  Pager: [phone] Or Epic Secure Chat After hours: [phone].  10/20/21, 5:19 PM

## 2021-09-29 DEATH — deceased

## 2021-10-10 ENCOUNTER — Ambulatory Visit: Payer: Medicare Other | Admitting: Surgery

## 2021-10-10 LAB — FUNGUS CULTURE WITH STAIN

## 2021-10-10 LAB — FUNGUS CULTURE RESULT

## 2021-10-10 LAB — FUNGAL ORGANISM REFLEX

## 2021-10-12 ENCOUNTER — Other Ambulatory Visit (HOSPITAL_COMMUNITY): Payer: Medicare Other

## 2021-10-19 ENCOUNTER — Ambulatory Visit: Payer: Medicare Other | Admitting: Pulmonary Disease

## 2021-10-24 ENCOUNTER — Ambulatory Visit: Payer: Medicare Other | Admitting: Cardiology

## 2021-10-24 LAB — ACID FAST CULTURE WITH REFLEXED SENSITIVITIES (MYCOBACTERIA): Acid Fast Culture: NEGATIVE

## 2021-11-30 ENCOUNTER — Ambulatory Visit: Payer: Medicare Other | Admitting: Behavioral Health

## 2022-07-03 LAB — MISCELLANEOUS TEST
